# Patient Record
Sex: Female | Born: 1945
Health system: Southern US, Community
[De-identification: ages and names within clinical notes are randomized; demographics above are authoritative.]

## PROBLEM LIST (undated history)

## (undated) DIAGNOSIS — F32A Depression, unspecified: Secondary | ICD-10-CM

## (undated) DIAGNOSIS — K219 Gastro-esophageal reflux disease without esophagitis: Secondary | ICD-10-CM

## (undated) DIAGNOSIS — I442 Atrioventricular block, complete: Secondary | ICD-10-CM

## (undated) DIAGNOSIS — Z5189 Encounter for other specified aftercare: Secondary | ICD-10-CM

## (undated) DIAGNOSIS — M1990 Unspecified osteoarthritis, unspecified site: Secondary | ICD-10-CM

## (undated) DIAGNOSIS — I209 Angina pectoris, unspecified: Secondary | ICD-10-CM

## (undated) DIAGNOSIS — I4821 Permanent atrial fibrillation: Secondary | ICD-10-CM

## (undated) DIAGNOSIS — Z87891 Personal history of nicotine dependence: Secondary | ICD-10-CM

## (undated) DIAGNOSIS — K922 Gastrointestinal hemorrhage, unspecified: Secondary | ICD-10-CM

## (undated) DIAGNOSIS — R51 Headache: Secondary | ICD-10-CM

## (undated) DIAGNOSIS — M79601 Pain in right arm: Secondary | ICD-10-CM

## (undated) DIAGNOSIS — T4145XA Adverse effect of unspecified anesthetic, initial encounter: Secondary | ICD-10-CM

## (undated) DIAGNOSIS — F410 Panic disorder [episodic paroxysmal anxiety] without agoraphobia: Secondary | ICD-10-CM

## (undated) DIAGNOSIS — I422 Other hypertrophic cardiomyopathy: Secondary | ICD-10-CM

## (undated) DIAGNOSIS — Z950 Presence of cardiac pacemaker: Secondary | ICD-10-CM

## (undated) DIAGNOSIS — I639 Cerebral infarction, unspecified: Secondary | ICD-10-CM

## (undated) DIAGNOSIS — D649 Anemia, unspecified: Secondary | ICD-10-CM

## (undated) DIAGNOSIS — K5520 Angiodysplasia of colon without hemorrhage: Secondary | ICD-10-CM

## (undated) DIAGNOSIS — T783XXA Angioneurotic edema, initial encounter: Secondary | ICD-10-CM

## (undated) DIAGNOSIS — J449 Chronic obstructive pulmonary disease, unspecified: Secondary | ICD-10-CM

## (undated) DIAGNOSIS — E119 Type 2 diabetes mellitus without complications: Secondary | ICD-10-CM

## (undated) DIAGNOSIS — Z86010 Personal history of colonic polyps: Secondary | ICD-10-CM

## (undated) DIAGNOSIS — G2581 Restless legs syndrome: Secondary | ICD-10-CM

## (undated) DIAGNOSIS — F0390 Unspecified dementia without behavioral disturbance: Secondary | ICD-10-CM

## (undated) DIAGNOSIS — K760 Fatty (change of) liver, not elsewhere classified: Secondary | ICD-10-CM

## (undated) DIAGNOSIS — K31819 Angiodysplasia of stomach and duodenum without bleeding: Secondary | ICD-10-CM

## (undated) DIAGNOSIS — N19 Unspecified kidney failure: Secondary | ICD-10-CM

## (undated) DIAGNOSIS — IMO0001 Reserved for inherently not codable concepts without codable children: Secondary | ICD-10-CM

## (undated) DIAGNOSIS — I509 Heart failure, unspecified: Secondary | ICD-10-CM

## (undated) DIAGNOSIS — J189 Pneumonia, unspecified organism: Secondary | ICD-10-CM

## (undated) DIAGNOSIS — F329 Major depressive disorder, single episode, unspecified: Secondary | ICD-10-CM

## (undated) DIAGNOSIS — R0602 Shortness of breath: Secondary | ICD-10-CM

## (undated) DIAGNOSIS — I219 Acute myocardial infarction, unspecified: Secondary | ICD-10-CM

## (undated) DIAGNOSIS — Z9289 Personal history of other medical treatment: Secondary | ICD-10-CM

## (undated) DIAGNOSIS — T8859XA Other complications of anesthesia, initial encounter: Secondary | ICD-10-CM

## (undated) DIAGNOSIS — F1011 Alcohol abuse, in remission: Secondary | ICD-10-CM

## (undated) DIAGNOSIS — I10 Essential (primary) hypertension: Secondary | ICD-10-CM

## (undated) DIAGNOSIS — D509 Iron deficiency anemia, unspecified: Secondary | ICD-10-CM

## (undated) DIAGNOSIS — E785 Hyperlipidemia, unspecified: Secondary | ICD-10-CM

## (undated) DIAGNOSIS — Z9889 Other specified postprocedural states: Secondary | ICD-10-CM

## (undated) DIAGNOSIS — IMO0002 Reserved for concepts with insufficient information to code with codable children: Secondary | ICD-10-CM

## (undated) DIAGNOSIS — G4730 Sleep apnea, unspecified: Secondary | ICD-10-CM

## (undated) HISTORY — DX: Other specified postprocedural states: Z98.890

## (undated) HISTORY — DX: Personal history of other medical treatment: Z92.89

## (undated) HISTORY — DX: Personal history of colonic polyps: Z86.010

## (undated) HISTORY — DX: Type 2 diabetes mellitus without complications: E11.9

## (undated) HISTORY — DX: Permanent atrial fibrillation: I48.21

## (undated) HISTORY — DX: Essential (primary) hypertension: I10

## (undated) HISTORY — DX: Unspecified osteoarthritis, unspecified site: M19.90

## (undated) HISTORY — DX: Sleep apnea, unspecified: G47.30

## (undated) HISTORY — DX: Panic disorder (episodic paroxysmal anxiety): F41.0

## (undated) HISTORY — DX: Depression, unspecified: F32.A

## (undated) HISTORY — DX: Hyperlipidemia, unspecified: E78.5

## (undated) HISTORY — DX: Iron deficiency anemia, unspecified: D50.9

## (undated) HISTORY — DX: Angiodysplasia of colon without hemorrhage: K55.20

## (undated) HISTORY — DX: Pain in right arm: M79.601

## (undated) HISTORY — DX: Anemia, unspecified: D64.9

## (undated) HISTORY — DX: Reserved for concepts with insufficient information to code with codable children: IMO0002

## (undated) HISTORY — DX: Restless legs syndrome: G25.81

## (undated) HISTORY — DX: Fatty (change of) liver, not elsewhere classified: K76.0

## (undated) HISTORY — DX: Unspecified kidney failure: N19

## (undated) HISTORY — DX: Alcohol abuse, in remission: F10.11

## (undated) HISTORY — PX: CARDIAC CATHETERIZATION: SHX172

## (undated) HISTORY — DX: Atrioventricular block, complete: I44.2

## (undated) HISTORY — DX: Other hypertrophic cardiomyopathy: I42.2

## (undated) HISTORY — DX: Angiodysplasia of stomach and duodenum without bleeding: K31.819

## (undated) HISTORY — DX: Personal history of nicotine dependence: Z87.891

## (undated) HISTORY — DX: Angioneurotic edema, initial encounter: T78.3XXA

## (undated) HISTORY — DX: Major depressive disorder, single episode, unspecified: F32.9

## (undated) HISTORY — DX: Gastrointestinal hemorrhage, unspecified: K92.2

## (undated) SURGERY — EGD (ESOPHAGOGASTRODUODENOSCOPY)
Anesthesia: Monitor Anesthesia Care

---

## 1996-12-15 HISTORY — PX: PACEMAKER INSERTION: SHX728

## 1998-04-13 ENCOUNTER — Encounter: Admission: RE | Admit: 1998-04-13 | Discharge: 1998-04-13 | Payer: Self-pay | Admitting: Sports Medicine

## 1998-07-16 ENCOUNTER — Encounter: Admission: RE | Admit: 1998-07-16 | Discharge: 1998-07-16 | Payer: Self-pay | Admitting: Family Medicine

## 1998-10-04 ENCOUNTER — Encounter: Admission: RE | Admit: 1998-10-04 | Discharge: 1998-10-04 | Payer: Self-pay | Admitting: Family Medicine

## 1998-11-29 ENCOUNTER — Encounter: Admission: RE | Admit: 1998-11-29 | Discharge: 1998-11-29 | Payer: Self-pay | Admitting: Family Medicine

## 1999-02-04 ENCOUNTER — Encounter: Admission: RE | Admit: 1999-02-04 | Discharge: 1999-02-04 | Payer: Self-pay | Admitting: Family Medicine

## 1999-02-23 ENCOUNTER — Encounter: Payer: Self-pay | Admitting: Emergency Medicine

## 1999-02-23 ENCOUNTER — Emergency Department (HOSPITAL_COMMUNITY): Admission: EM | Admit: 1999-02-23 | Discharge: 1999-02-23 | Payer: Self-pay | Admitting: Emergency Medicine

## 1999-02-26 ENCOUNTER — Other Ambulatory Visit: Admission: RE | Admit: 1999-02-26 | Discharge: 1999-02-26 | Payer: Self-pay | Admitting: *Deleted

## 1999-02-26 ENCOUNTER — Encounter: Admission: RE | Admit: 1999-02-26 | Discharge: 1999-02-26 | Payer: Self-pay | Admitting: Sports Medicine

## 1999-03-18 ENCOUNTER — Encounter: Admission: RE | Admit: 1999-03-18 | Discharge: 1999-03-18 | Payer: Self-pay | Admitting: Family Medicine

## 1999-04-09 ENCOUNTER — Encounter: Admission: RE | Admit: 1999-04-09 | Discharge: 1999-04-09 | Payer: Self-pay | Admitting: Family Medicine

## 1999-05-27 ENCOUNTER — Other Ambulatory Visit: Admission: RE | Admit: 1999-05-27 | Discharge: 1999-05-27 | Payer: Self-pay | Admitting: Obstetrics

## 1999-05-27 ENCOUNTER — Encounter: Admission: RE | Admit: 1999-05-27 | Discharge: 1999-05-27 | Payer: Self-pay | Admitting: Family Medicine

## 1999-06-10 ENCOUNTER — Encounter: Admission: RE | Admit: 1999-06-10 | Discharge: 1999-06-10 | Payer: Self-pay | Admitting: Family Medicine

## 1999-07-06 ENCOUNTER — Ambulatory Visit: Admission: RE | Admit: 1999-07-06 | Discharge: 1999-07-06 | Payer: Self-pay | Admitting: Otolaryngology

## 1999-10-10 ENCOUNTER — Encounter: Admission: RE | Admit: 1999-10-10 | Discharge: 1999-10-10 | Payer: Self-pay | Admitting: Family Medicine

## 1999-10-30 ENCOUNTER — Encounter: Admission: RE | Admit: 1999-10-30 | Discharge: 1999-10-30 | Payer: Self-pay | Admitting: Family Medicine

## 2000-01-14 ENCOUNTER — Encounter: Admission: RE | Admit: 2000-01-14 | Discharge: 2000-01-14 | Payer: Self-pay | Admitting: Sports Medicine

## 2000-04-01 ENCOUNTER — Encounter: Admission: RE | Admit: 2000-04-01 | Discharge: 2000-04-01 | Payer: Self-pay | Admitting: Family Medicine

## 2000-04-01 HISTORY — PX: TUBAL LIGATION: SHX77

## 2000-04-27 ENCOUNTER — Encounter: Admission: RE | Admit: 2000-04-27 | Discharge: 2000-04-27 | Payer: Self-pay | Admitting: Sports Medicine

## 2000-04-27 ENCOUNTER — Encounter: Payer: Self-pay | Admitting: Sports Medicine

## 2000-04-30 ENCOUNTER — Encounter: Admission: RE | Admit: 2000-04-30 | Discharge: 2000-04-30 | Payer: Self-pay | Admitting: Family Medicine

## 2000-11-03 ENCOUNTER — Other Ambulatory Visit: Admission: RE | Admit: 2000-11-03 | Discharge: 2000-11-03 | Payer: Self-pay | Admitting: *Deleted

## 2000-11-03 ENCOUNTER — Encounter: Admission: RE | Admit: 2000-11-03 | Discharge: 2000-11-03 | Payer: Self-pay | Admitting: Sports Medicine

## 2001-03-18 ENCOUNTER — Encounter: Admission: RE | Admit: 2001-03-18 | Discharge: 2001-03-18 | Payer: Self-pay | Admitting: Family Medicine

## 2001-07-13 ENCOUNTER — Encounter: Admission: RE | Admit: 2001-07-13 | Discharge: 2001-07-13 | Payer: Self-pay | Admitting: Family Medicine

## 2001-12-20 ENCOUNTER — Encounter: Admission: RE | Admit: 2001-12-20 | Discharge: 2001-12-20 | Payer: Self-pay | Admitting: Family Medicine

## 2001-12-27 ENCOUNTER — Encounter: Admission: RE | Admit: 2001-12-27 | Discharge: 2001-12-27 | Payer: Self-pay | Admitting: *Deleted

## 2002-01-04 ENCOUNTER — Other Ambulatory Visit: Admission: RE | Admit: 2002-01-04 | Discharge: 2002-01-04 | Payer: Self-pay | Admitting: Family Medicine

## 2002-01-04 ENCOUNTER — Ambulatory Visit (HOSPITAL_COMMUNITY): Admission: RE | Admit: 2002-01-04 | Discharge: 2002-01-04 | Payer: Self-pay | Admitting: Family Medicine

## 2002-01-04 ENCOUNTER — Encounter: Admission: RE | Admit: 2002-01-04 | Discharge: 2002-01-04 | Payer: Self-pay | Admitting: Family Medicine

## 2002-01-11 ENCOUNTER — Encounter: Admission: RE | Admit: 2002-01-11 | Discharge: 2002-01-11 | Payer: Self-pay | Admitting: Family Medicine

## 2002-05-31 ENCOUNTER — Encounter: Admission: RE | Admit: 2002-05-31 | Discharge: 2002-05-31 | Payer: Self-pay | Admitting: Family Medicine

## 2002-08-01 ENCOUNTER — Encounter: Admission: RE | Admit: 2002-08-01 | Discharge: 2002-08-01 | Payer: Self-pay | Admitting: Sports Medicine

## 2002-08-01 ENCOUNTER — Encounter: Admission: RE | Admit: 2002-08-01 | Discharge: 2002-08-01 | Payer: Self-pay | Admitting: Family Medicine

## 2002-08-01 ENCOUNTER — Encounter: Payer: Self-pay | Admitting: Sports Medicine

## 2002-08-30 ENCOUNTER — Encounter: Admission: RE | Admit: 2002-08-30 | Discharge: 2002-08-30 | Payer: Self-pay | Admitting: Family Medicine

## 2002-09-28 ENCOUNTER — Encounter: Admission: RE | Admit: 2002-09-28 | Discharge: 2002-09-28 | Payer: Self-pay | Admitting: Family Medicine

## 2003-01-13 ENCOUNTER — Encounter: Admission: RE | Admit: 2003-01-13 | Discharge: 2003-01-13 | Payer: Self-pay | Admitting: Family Medicine

## 2003-02-06 ENCOUNTER — Encounter: Admission: RE | Admit: 2003-02-06 | Discharge: 2003-02-06 | Payer: Self-pay | Admitting: Family Medicine

## 2003-02-13 ENCOUNTER — Encounter: Payer: Self-pay | Admitting: Sports Medicine

## 2003-02-13 ENCOUNTER — Encounter: Admission: RE | Admit: 2003-02-13 | Discharge: 2003-02-13 | Payer: Self-pay | Admitting: Sports Medicine

## 2003-07-14 ENCOUNTER — Encounter: Admission: RE | Admit: 2003-07-14 | Discharge: 2003-07-14 | Payer: Self-pay | Admitting: Family Medicine

## 2003-10-12 ENCOUNTER — Encounter: Admission: RE | Admit: 2003-10-12 | Discharge: 2003-10-12 | Payer: Self-pay | Admitting: Family Medicine

## 2003-11-13 ENCOUNTER — Encounter: Admission: RE | Admit: 2003-11-13 | Discharge: 2003-11-13 | Payer: Self-pay | Admitting: Sports Medicine

## 2003-11-13 ENCOUNTER — Other Ambulatory Visit: Admission: RE | Admit: 2003-11-13 | Discharge: 2003-11-13 | Payer: Self-pay | Admitting: Family Medicine

## 2003-11-17 ENCOUNTER — Ambulatory Visit (HOSPITAL_COMMUNITY): Admission: RE | Admit: 2003-11-17 | Discharge: 2003-11-18 | Payer: Self-pay | Admitting: Cardiology

## 2003-11-18 ENCOUNTER — Ambulatory Visit (HOSPITAL_COMMUNITY): Admission: RE | Admit: 2003-11-18 | Discharge: 2003-11-18 | Payer: Self-pay | Admitting: Cardiology

## 2004-04-03 ENCOUNTER — Encounter: Payer: Self-pay | Admitting: Internal Medicine

## 2004-04-10 ENCOUNTER — Encounter: Admission: RE | Admit: 2004-04-10 | Discharge: 2004-04-10 | Payer: Self-pay | Admitting: Family Medicine

## 2004-05-01 ENCOUNTER — Encounter: Payer: Self-pay | Admitting: Internal Medicine

## 2004-05-07 ENCOUNTER — Encounter: Admission: RE | Admit: 2004-05-07 | Discharge: 2004-05-07 | Payer: Self-pay | Admitting: Sports Medicine

## 2004-09-04 ENCOUNTER — Ambulatory Visit: Payer: Self-pay | Admitting: Family Medicine

## 2004-11-25 ENCOUNTER — Ambulatory Visit: Payer: Self-pay | Admitting: Cardiology

## 2004-12-03 ENCOUNTER — Ambulatory Visit: Payer: Self-pay | Admitting: Internal Medicine

## 2005-01-06 ENCOUNTER — Ambulatory Visit: Payer: Self-pay | Admitting: Internal Medicine

## 2005-02-10 ENCOUNTER — Ambulatory Visit: Payer: Self-pay | Admitting: Cardiology

## 2005-02-28 ENCOUNTER — Ambulatory Visit: Payer: Self-pay | Admitting: Cardiology

## 2005-05-29 ENCOUNTER — Ambulatory Visit: Payer: Self-pay | Admitting: Cardiology

## 2005-06-18 ENCOUNTER — Ambulatory Visit: Payer: Self-pay | Admitting: Family Medicine

## 2005-07-02 ENCOUNTER — Encounter: Admission: RE | Admit: 2005-07-02 | Discharge: 2005-07-02 | Payer: Self-pay | Admitting: Sports Medicine

## 2005-07-09 ENCOUNTER — Ambulatory Visit: Payer: Self-pay | Admitting: Sports Medicine

## 2005-08-11 ENCOUNTER — Ambulatory Visit: Payer: Self-pay | Admitting: Family Medicine

## 2005-08-28 ENCOUNTER — Ambulatory Visit: Payer: Self-pay | Admitting: Cardiology

## 2005-09-10 ENCOUNTER — Ambulatory Visit: Payer: Self-pay | Admitting: Family Medicine

## 2005-09-10 ENCOUNTER — Other Ambulatory Visit: Admission: RE | Admit: 2005-09-10 | Discharge: 2005-09-10 | Payer: Self-pay | Admitting: Family Medicine

## 2005-11-27 ENCOUNTER — Ambulatory Visit: Payer: Self-pay | Admitting: Cardiology

## 2006-03-02 ENCOUNTER — Ambulatory Visit: Payer: Self-pay | Admitting: Cardiology

## 2006-03-31 ENCOUNTER — Ambulatory Visit: Payer: Self-pay | Admitting: Family Medicine

## 2006-04-13 ENCOUNTER — Ambulatory Visit: Payer: Self-pay | Admitting: Cardiology

## 2006-04-17 ENCOUNTER — Ambulatory Visit: Payer: Self-pay | Admitting: Family Medicine

## 2006-04-28 ENCOUNTER — Ambulatory Visit: Payer: Self-pay | Admitting: Sports Medicine

## 2006-05-04 ENCOUNTER — Ambulatory Visit: Payer: Self-pay | Admitting: Cardiology

## 2006-05-04 ENCOUNTER — Encounter: Payer: Self-pay | Admitting: Cardiology

## 2006-05-04 ENCOUNTER — Ambulatory Visit: Payer: Self-pay

## 2006-06-01 ENCOUNTER — Ambulatory Visit: Payer: Self-pay | Admitting: Cardiology

## 2006-06-10 ENCOUNTER — Ambulatory Visit: Payer: Self-pay | Admitting: Family Medicine

## 2006-06-16 ENCOUNTER — Ambulatory Visit (HOSPITAL_COMMUNITY): Admission: RE | Admit: 2006-06-16 | Discharge: 2006-06-16 | Payer: Self-pay | Admitting: Sports Medicine

## 2006-09-14 ENCOUNTER — Encounter (INDEPENDENT_AMBULATORY_CARE_PROVIDER_SITE_OTHER): Payer: Self-pay | Admitting: *Deleted

## 2006-09-14 LAB — CONVERTED CEMR LAB

## 2006-10-02 ENCOUNTER — Other Ambulatory Visit: Admission: RE | Admit: 2006-10-02 | Discharge: 2006-10-02 | Payer: Self-pay | Admitting: Family Medicine

## 2006-10-02 ENCOUNTER — Ambulatory Visit: Payer: Self-pay | Admitting: Family Medicine

## 2006-10-14 ENCOUNTER — Encounter: Admission: RE | Admit: 2006-10-14 | Discharge: 2006-10-14 | Payer: Self-pay | Admitting: Family Medicine

## 2006-11-09 ENCOUNTER — Ambulatory Visit: Payer: Self-pay | Admitting: Family Medicine

## 2006-11-17 ENCOUNTER — Ambulatory Visit: Payer: Self-pay | Admitting: Family Medicine

## 2006-11-30 ENCOUNTER — Ambulatory Visit: Payer: Self-pay | Admitting: Cardiology

## 2006-12-21 ENCOUNTER — Ambulatory Visit: Payer: Self-pay

## 2007-01-28 ENCOUNTER — Ambulatory Visit: Payer: Self-pay | Admitting: Cardiology

## 2007-02-11 DIAGNOSIS — E785 Hyperlipidemia, unspecified: Secondary | ICD-10-CM

## 2007-02-11 DIAGNOSIS — I10 Essential (primary) hypertension: Secondary | ICD-10-CM | POA: Insufficient documentation

## 2007-02-11 DIAGNOSIS — G4730 Sleep apnea, unspecified: Secondary | ICD-10-CM | POA: Insufficient documentation

## 2007-02-12 ENCOUNTER — Encounter (INDEPENDENT_AMBULATORY_CARE_PROVIDER_SITE_OTHER): Payer: Self-pay | Admitting: *Deleted

## 2007-02-15 ENCOUNTER — Ambulatory Visit: Payer: Self-pay | Admitting: Family Medicine

## 2007-03-12 ENCOUNTER — Ambulatory Visit: Payer: Self-pay | Admitting: Family Medicine

## 2007-03-12 ENCOUNTER — Encounter (INDEPENDENT_AMBULATORY_CARE_PROVIDER_SITE_OTHER): Payer: Self-pay | Admitting: Family Medicine

## 2007-03-12 LAB — CONVERTED CEMR LAB
Nitrite: NEGATIVE
Specific Gravity, Urine: >=1.03
Whiff Test: NEGATIVE
pH: 5.5

## 2007-03-16 ENCOUNTER — Encounter (INDEPENDENT_AMBULATORY_CARE_PROVIDER_SITE_OTHER): Payer: Self-pay | Admitting: Family Medicine

## 2007-04-06 ENCOUNTER — Emergency Department (HOSPITAL_COMMUNITY): Admission: EM | Admit: 2007-04-06 | Discharge: 2007-04-07 | Payer: Self-pay | Admitting: Emergency Medicine

## 2007-06-29 ENCOUNTER — Ambulatory Visit: Payer: Self-pay | Admitting: Family Medicine

## 2007-06-29 ENCOUNTER — Encounter: Payer: Self-pay | Admitting: Family Medicine

## 2007-06-29 LAB — CONVERTED CEMR LAB
HCT: 32.8 % — ABNORMAL LOW (ref 36.0–46.0)
Hemoglobin: 10.8 g/dL — ABNORMAL LOW (ref 12.0–15.0)
MCV: 92.9 fL (ref 78.0–100.0)
RBC: 3.53 M/uL — ABNORMAL LOW (ref 3.87–5.11)
WBC: 6 10*3/uL (ref 4.0–10.5)

## 2007-07-12 ENCOUNTER — Ambulatory Visit: Payer: Self-pay | Admitting: Cardiology

## 2007-07-19 ENCOUNTER — Ambulatory Visit: Payer: Self-pay | Admitting: Sports Medicine

## 2007-07-19 ENCOUNTER — Encounter (INDEPENDENT_AMBULATORY_CARE_PROVIDER_SITE_OTHER): Payer: Self-pay | Admitting: Family Medicine

## 2007-07-19 LAB — CONVERTED CEMR LAB
Chlamydia, DNA Probe: NEGATIVE
GC Probe Amp, Genital: NEGATIVE

## 2007-07-20 ENCOUNTER — Encounter (INDEPENDENT_AMBULATORY_CARE_PROVIDER_SITE_OTHER): Payer: Self-pay | Admitting: Family Medicine

## 2007-10-13 ENCOUNTER — Ambulatory Visit: Payer: Self-pay | Admitting: Cardiology

## 2007-10-15 ENCOUNTER — Encounter: Payer: Self-pay | Admitting: Family Medicine

## 2007-10-15 ENCOUNTER — Ambulatory Visit: Payer: Self-pay | Admitting: Family Medicine

## 2007-10-15 ENCOUNTER — Other Ambulatory Visit: Admission: RE | Admit: 2007-10-15 | Discharge: 2007-10-15 | Payer: Self-pay | Admitting: Family Medicine

## 2007-10-19 ENCOUNTER — Encounter: Payer: Self-pay | Admitting: Family Medicine

## 2007-10-19 LAB — CONVERTED CEMR LAB: Chlamydia, DNA Probe: NEGATIVE

## 2007-10-21 ENCOUNTER — Encounter: Payer: Self-pay | Admitting: Family Medicine

## 2007-11-02 ENCOUNTER — Encounter: Payer: Self-pay | Admitting: Family Medicine

## 2007-12-27 ENCOUNTER — Ambulatory Visit: Payer: Self-pay | Admitting: Cardiology

## 2007-12-27 LAB — CONVERTED CEMR LAB
GFR calc Af Amer: 72 mL/min
GFR calc non Af Amer: 60 mL/min
Glucose, Bld: 108 mg/dL — ABNORMAL HIGH (ref 70–99)
Sodium: 138 meq/L (ref 135–145)

## 2008-01-03 ENCOUNTER — Encounter: Payer: Self-pay | Admitting: Family Medicine

## 2008-01-11 ENCOUNTER — Ambulatory Visit: Payer: Self-pay | Admitting: Cardiology

## 2008-01-11 LAB — CONVERTED CEMR LAB
Chloride: 100 meq/L (ref 96–112)
GFR calc Af Amer: 72 mL/min
GFR calc non Af Amer: 60 mL/min
Glucose, Bld: 131 mg/dL — ABNORMAL HIGH (ref 70–99)
Sodium: 137 meq/L (ref 135–145)

## 2008-01-14 ENCOUNTER — Ambulatory Visit: Payer: Self-pay | Admitting: Family Medicine

## 2008-01-14 ENCOUNTER — Telehealth: Payer: Self-pay | Admitting: *Deleted

## 2008-01-18 ENCOUNTER — Ambulatory Visit: Payer: Self-pay | Admitting: Family Medicine

## 2008-01-18 ENCOUNTER — Encounter: Admission: RE | Admit: 2008-01-18 | Discharge: 2008-01-18 | Payer: Self-pay | Admitting: Family Medicine

## 2008-01-20 ENCOUNTER — Encounter: Payer: Self-pay | Admitting: Family Medicine

## 2008-02-22 ENCOUNTER — Emergency Department (HOSPITAL_COMMUNITY): Admission: EM | Admit: 2008-02-22 | Discharge: 2008-02-23 | Payer: Self-pay | Admitting: Emergency Medicine

## 2008-03-15 ENCOUNTER — Encounter: Payer: Self-pay | Admitting: Family Medicine

## 2008-03-15 ENCOUNTER — Ambulatory Visit: Payer: Self-pay | Admitting: Internal Medicine

## 2008-03-15 ENCOUNTER — Observation Stay (HOSPITAL_COMMUNITY): Admission: EM | Admit: 2008-03-15 | Discharge: 2008-03-15 | Payer: Self-pay | Admitting: Emergency Medicine

## 2008-03-27 ENCOUNTER — Encounter: Admission: RE | Admit: 2008-03-27 | Discharge: 2008-03-27 | Payer: Self-pay | Admitting: Family Medicine

## 2008-04-06 ENCOUNTER — Encounter: Payer: Self-pay | Admitting: *Deleted

## 2008-05-11 ENCOUNTER — Inpatient Hospital Stay (HOSPITAL_COMMUNITY): Admission: EM | Admit: 2008-05-11 | Discharge: 2008-05-16 | Payer: Self-pay | Admitting: Emergency Medicine

## 2008-05-11 ENCOUNTER — Ambulatory Visit: Payer: Self-pay | Admitting: Cardiology

## 2008-05-11 ENCOUNTER — Ambulatory Visit: Payer: Self-pay | Admitting: Family Medicine

## 2008-05-11 ENCOUNTER — Encounter: Payer: Self-pay | Admitting: Family Medicine

## 2008-05-11 ENCOUNTER — Ambulatory Visit: Payer: Self-pay | Admitting: Pulmonary Disease

## 2008-05-19 ENCOUNTER — Ambulatory Visit: Payer: Self-pay | Admitting: Cardiology

## 2008-05-19 LAB — CONVERTED CEMR LAB
BUN: 38 mg/dL — ABNORMAL HIGH (ref 6–23)
CO2: 28 meq/L (ref 19–32)
GFR calc Af Amer: 53 mL/min
Glucose, Bld: 94 mg/dL (ref 70–99)
Potassium: 4.1 meq/L (ref 3.5–5.1)

## 2008-05-22 ENCOUNTER — Ambulatory Visit: Payer: Self-pay | Admitting: Cardiology

## 2008-05-24 ENCOUNTER — Encounter: Payer: Self-pay | Admitting: Family Medicine

## 2008-06-01 ENCOUNTER — Ambulatory Visit: Payer: Self-pay | Admitting: Family Medicine

## 2008-06-01 ENCOUNTER — Encounter: Payer: Self-pay | Admitting: *Deleted

## 2008-06-06 ENCOUNTER — Ambulatory Visit: Payer: Self-pay | Admitting: Internal Medicine

## 2008-06-14 ENCOUNTER — Ambulatory Visit: Payer: Self-pay | Admitting: Cardiology

## 2008-06-14 LAB — CONVERTED CEMR LAB
Basophils Absolute: 0 10*3/uL (ref 0.0–0.1)
CO2: 26 meq/L (ref 19–32)
Calcium: 9.4 mg/dL (ref 8.4–10.5)
Creatinine, Ser: 1.3 mg/dL — ABNORMAL HIGH (ref 0.4–1.2)
Eosinophils Absolute: 0.5 10*3/uL (ref 0.0–0.7)
GFR calc Af Amer: 53 mL/min
GFR calc non Af Amer: 44 mL/min
Hemoglobin: 11 g/dL — ABNORMAL LOW (ref 12.0–15.0)
Lymphocytes Relative: 46.1 % — ABNORMAL HIGH (ref 12.0–46.0)
MCHC: 33.5 g/dL (ref 30.0–36.0)
MCV: 88.3 fL (ref 78.0–100.0)
Monocytes Absolute: 0.9 10*3/uL (ref 0.1–1.0)
Neutro Abs: 0.7 10*3/uL — ABNORMAL LOW (ref 1.4–7.7)
RDW: 15.1 % — ABNORMAL HIGH (ref 11.5–14.6)
Sodium: 139 meq/L (ref 135–145)

## 2008-06-23 ENCOUNTER — Ambulatory Visit: Payer: Self-pay

## 2008-06-23 ENCOUNTER — Ambulatory Visit: Payer: Self-pay | Admitting: Cardiology

## 2008-06-23 ENCOUNTER — Encounter: Payer: Self-pay | Admitting: Cardiology

## 2008-08-14 ENCOUNTER — Ambulatory Visit: Payer: Self-pay | Admitting: Cardiology

## 2008-08-14 LAB — CONVERTED CEMR LAB
BUN: 35 mg/dL — ABNORMAL HIGH (ref 6–23)
Basophils Relative: 0.9 % (ref 0.0–3.0)
CO2: 28 meq/L (ref 19–32)
Chloride: 109 meq/L (ref 96–112)
Creatinine, Ser: 1.2 mg/dL (ref 0.4–1.2)
Eosinophils Relative: 8.4 % — ABNORMAL HIGH (ref 0.0–5.0)
GFR calc non Af Amer: 48 mL/min
Lymphocytes Relative: 41.1 % (ref 12.0–46.0)
Monocytes Relative: 9.4 % (ref 3.0–12.0)
Neutrophils Relative %: 40.2 % — ABNORMAL LOW (ref 43.0–77.0)
Platelets: 219 10*3/uL (ref 150–400)
Potassium: 4.6 meq/L (ref 3.5–5.1)
RBC: 3.6 M/uL — ABNORMAL LOW (ref 3.87–5.11)
WBC: 4.2 10*3/uL — ABNORMAL LOW (ref 4.5–10.5)

## 2008-08-28 ENCOUNTER — Ambulatory Visit: Payer: Self-pay

## 2008-08-28 ENCOUNTER — Ambulatory Visit: Payer: Self-pay | Admitting: Cardiology

## 2008-12-05 ENCOUNTER — Encounter: Payer: Self-pay | Admitting: *Deleted

## 2008-12-12 ENCOUNTER — Encounter: Payer: Self-pay | Admitting: Family Medicine

## 2008-12-12 ENCOUNTER — Ambulatory Visit: Payer: Self-pay | Admitting: Family Medicine

## 2008-12-12 LAB — CONVERTED CEMR LAB: Direct LDL: 85 mg/dL

## 2008-12-20 ENCOUNTER — Ambulatory Visit: Payer: Self-pay | Admitting: Cardiology

## 2008-12-20 LAB — CONVERTED CEMR LAB
Basophils Relative: 0.8 % (ref 0.0–3.0)
CO2: 31 meq/L (ref 19–32)
Calcium: 10.1 mg/dL (ref 8.4–10.5)
Creatinine, Ser: 1.2 mg/dL (ref 0.4–1.2)
GFR calc Af Amer: 58 mL/min
Glucose, Bld: 109 mg/dL — ABNORMAL HIGH (ref 70–99)
HCT: 34.1 % — ABNORMAL LOW (ref 36.0–46.0)
Hemoglobin: 11.7 g/dL — ABNORMAL LOW (ref 12.0–15.0)
Lymphocytes Relative: 29.7 % (ref 12.0–46.0)
MCHC: 34.3 g/dL (ref 30.0–36.0)
Monocytes Absolute: 0.8 10*3/uL (ref 0.1–1.0)
Monocytes Relative: 13.2 % — ABNORMAL HIGH (ref 3.0–12.0)
Neutro Abs: 3 10*3/uL (ref 1.4–7.7)
RBC: 3.71 M/uL — ABNORMAL LOW (ref 3.87–5.11)
RDW: 12.8 % (ref 11.5–14.6)
Sodium: 139 meq/L (ref 135–145)

## 2008-12-21 ENCOUNTER — Encounter: Payer: Self-pay | Admitting: Family Medicine

## 2009-01-18 ENCOUNTER — Ambulatory Visit: Payer: Self-pay | Admitting: Family Medicine

## 2009-01-25 ENCOUNTER — Ambulatory Visit: Payer: Self-pay | Admitting: Family Medicine

## 2009-01-30 ENCOUNTER — Encounter: Payer: Self-pay | Admitting: Family Medicine

## 2009-03-26 ENCOUNTER — Encounter: Payer: Self-pay | Admitting: Family Medicine

## 2009-03-26 ENCOUNTER — Ambulatory Visit: Payer: Self-pay | Admitting: Family Medicine

## 2009-04-03 ENCOUNTER — Encounter: Admission: RE | Admit: 2009-04-03 | Discharge: 2009-04-03 | Payer: Self-pay | Admitting: Family Medicine

## 2009-04-07 ENCOUNTER — Encounter (INDEPENDENT_AMBULATORY_CARE_PROVIDER_SITE_OTHER): Payer: Self-pay | Admitting: *Deleted

## 2009-04-17 ENCOUNTER — Ambulatory Visit: Payer: Self-pay | Admitting: Cardiology

## 2009-04-17 LAB — CONVERTED CEMR LAB
BUN: 30 mg/dL — ABNORMAL HIGH (ref 6–23)
Basophils Absolute: 0 10*3/uL (ref 0.0–0.1)
CO2: 28 meq/L (ref 19–32)
Calcium: 9.5 mg/dL (ref 8.4–10.5)
Creatinine, Ser: 1.6 mg/dL — ABNORMAL HIGH (ref 0.4–1.2)
Eosinophils Absolute: 0.2 10*3/uL (ref 0.0–0.7)
GFR calc non Af Amer: 41.57 mL/min (ref >60)
Glucose, Bld: 90 mg/dL (ref 70–99)
Lymphocytes Relative: 39.1 % (ref 12.0–46.0)
MCHC: 34 g/dL (ref 30.0–36.0)
Monocytes Relative: 12.1 % — ABNORMAL HIGH (ref 3.0–12.0)
Neutro Abs: 1.6 10*3/uL (ref 1.4–7.7)
Neutrophils Relative %: 43 % (ref 43.0–77.0)
Platelets: 177 10*3/uL (ref 150.0–400.0)
RDW: 12.8 % (ref 11.5–14.6)

## 2009-04-25 ENCOUNTER — Ambulatory Visit: Payer: Self-pay | Admitting: Family Medicine

## 2009-04-25 ENCOUNTER — Encounter: Payer: Self-pay | Admitting: Family Medicine

## 2009-04-25 LAB — CONVERTED CEMR LAB
Chlamydia, DNA Probe: NEGATIVE
GC Probe Amp, Genital: NEGATIVE
Hgb A1c MFr Bld: 6.6 %

## 2009-04-27 ENCOUNTER — Encounter: Payer: Self-pay | Admitting: Family Medicine

## 2009-06-21 ENCOUNTER — Telehealth: Payer: Self-pay | Admitting: *Deleted

## 2009-07-05 ENCOUNTER — Encounter: Payer: Self-pay | Admitting: Family Medicine

## 2009-07-05 ENCOUNTER — Ambulatory Visit: Payer: Self-pay | Admitting: Family Medicine

## 2009-07-05 LAB — CONVERTED CEMR LAB
Albumin: 4 g/dL (ref 3.5–5.2)
Alkaline Phosphatase: 46 units/L (ref 39–117)
BUN: 29 mg/dL — ABNORMAL HIGH (ref 6–23)
Calcium: 9.5 mg/dL (ref 8.4–10.5)
Creatinine, Ser: 1.29 mg/dL — ABNORMAL HIGH (ref 0.40–1.20)
Glucose, Bld: 97 mg/dL (ref 70–99)
Potassium: 4.6 meq/L (ref 3.5–5.3)

## 2009-07-06 ENCOUNTER — Encounter: Payer: Self-pay | Admitting: Family Medicine

## 2009-08-22 ENCOUNTER — Telehealth: Payer: Self-pay | Admitting: Cardiology

## 2009-08-22 ENCOUNTER — Encounter: Payer: Self-pay | Admitting: Cardiology

## 2009-08-22 ENCOUNTER — Ambulatory Visit: Payer: Self-pay

## 2009-08-28 ENCOUNTER — Telehealth: Payer: Self-pay | Admitting: Cardiology

## 2009-08-31 ENCOUNTER — Encounter (INDEPENDENT_AMBULATORY_CARE_PROVIDER_SITE_OTHER): Payer: Self-pay | Admitting: *Deleted

## 2009-09-11 ENCOUNTER — Ambulatory Visit: Payer: Self-pay | Admitting: Cardiology

## 2009-09-11 ENCOUNTER — Encounter (INDEPENDENT_AMBULATORY_CARE_PROVIDER_SITE_OTHER): Payer: Self-pay | Admitting: *Deleted

## 2009-10-01 ENCOUNTER — Ambulatory Visit: Payer: Self-pay | Admitting: Cardiology

## 2009-10-01 ENCOUNTER — Encounter: Payer: Self-pay | Admitting: Cardiology

## 2009-10-01 ENCOUNTER — Ambulatory Visit: Payer: Self-pay

## 2009-10-01 ENCOUNTER — Ambulatory Visit: Payer: Self-pay | Admitting: Internal Medicine

## 2009-10-01 ENCOUNTER — Ambulatory Visit (HOSPITAL_COMMUNITY): Admission: RE | Admit: 2009-10-01 | Discharge: 2009-10-01 | Payer: Self-pay | Admitting: Internal Medicine

## 2009-10-09 LAB — CONVERTED CEMR LAB
BUN: 25 mg/dL — ABNORMAL HIGH (ref 6–23)
Basophils Relative: 1.5 % (ref 0.0–3.0)
Chloride: 103 meq/L (ref 96–112)
Eosinophils Relative: 5 % (ref 0.0–5.0)
Glucose, Bld: 95 mg/dL (ref 70–99)
Hemoglobin: 11.9 g/dL — ABNORMAL LOW (ref 12.0–15.0)
Lymphocytes Relative: 35.5 % (ref 12.0–46.0)
MCV: 93.6 fL (ref 78.0–100.0)
Neutrophils Relative %: 48.6 % (ref 43.0–77.0)
Potassium: 4.8 meq/L (ref 3.5–5.1)
RBC: 3.78 M/uL — ABNORMAL LOW (ref 3.87–5.11)
WBC: 3.6 10*3/uL — ABNORMAL LOW (ref 4.5–10.5)

## 2010-01-11 ENCOUNTER — Ambulatory Visit: Payer: Self-pay | Admitting: Family Medicine

## 2010-01-11 ENCOUNTER — Encounter: Payer: Self-pay | Admitting: Family Medicine

## 2010-01-11 LAB — CONVERTED CEMR LAB
BUN: 32 mg/dL — ABNORMAL HIGH (ref 6–23)
CO2: 25 meq/L (ref 19–32)
Calcium: 9.8 mg/dL (ref 8.4–10.5)
Chloride: 104 meq/L (ref 96–112)
Creatinine, Ser: 1.31 mg/dL — ABNORMAL HIGH (ref 0.40–1.20)
Direct LDL: 79 mg/dL
Glucose, Bld: 108 mg/dL — ABNORMAL HIGH (ref 70–99)

## 2010-01-17 ENCOUNTER — Ambulatory Visit: Payer: Self-pay | Admitting: Cardiology

## 2010-02-08 ENCOUNTER — Ambulatory Visit: Payer: Self-pay | Admitting: Family Medicine

## 2010-04-23 ENCOUNTER — Encounter: Admission: RE | Admit: 2010-04-23 | Discharge: 2010-04-23 | Payer: Self-pay | Admitting: Cardiology

## 2010-05-02 ENCOUNTER — Telehealth: Payer: Self-pay | Admitting: *Deleted

## 2010-05-07 ENCOUNTER — Encounter: Payer: Self-pay | Admitting: Family Medicine

## 2010-05-07 ENCOUNTER — Ambulatory Visit: Payer: Self-pay | Admitting: Family Medicine

## 2010-05-07 LAB — CONVERTED CEMR LAB: Hgb A1c MFr Bld: 6.1 %

## 2010-05-08 LAB — CONVERTED CEMR LAB
BUN: 39 mg/dL — ABNORMAL HIGH (ref 6–23)
Calcium: 9.9 mg/dL (ref 8.4–10.5)
Glucose, Bld: 104 mg/dL — ABNORMAL HIGH (ref 70–99)

## 2010-05-14 ENCOUNTER — Ambulatory Visit: Payer: Self-pay | Admitting: Family Medicine

## 2010-05-14 ENCOUNTER — Encounter: Payer: Self-pay | Admitting: Family Medicine

## 2010-05-14 LAB — CONVERTED CEMR LAB
Calcium: 9.3 mg/dL (ref 8.4–10.5)
Glucose, Bld: 113 mg/dL — ABNORMAL HIGH (ref 70–99)
Sodium: 136 meq/L (ref 135–145)

## 2010-05-24 ENCOUNTER — Ambulatory Visit: Payer: Self-pay | Admitting: Family Medicine

## 2010-05-24 ENCOUNTER — Encounter: Payer: Self-pay | Admitting: Family Medicine

## 2010-05-27 ENCOUNTER — Encounter: Payer: Self-pay | Admitting: Family Medicine

## 2010-05-27 LAB — CONVERTED CEMR LAB
CO2: 21 meq/L (ref 19–32)
Calcium: 9.2 mg/dL (ref 8.4–10.5)
Chloride: 106 meq/L (ref 96–112)
Sodium: 138 meq/L (ref 135–145)

## 2010-05-29 ENCOUNTER — Ambulatory Visit: Payer: Self-pay | Admitting: Cardiovascular Disease

## 2010-05-29 ENCOUNTER — Encounter: Payer: Self-pay | Admitting: Family Medicine

## 2010-05-29 ENCOUNTER — Inpatient Hospital Stay (HOSPITAL_COMMUNITY): Admission: EM | Admit: 2010-05-29 | Discharge: 2010-05-30 | Payer: Self-pay | Admitting: Emergency Medicine

## 2010-05-29 ENCOUNTER — Ambulatory Visit: Payer: Self-pay | Admitting: Family Medicine

## 2010-05-30 ENCOUNTER — Encounter: Payer: Self-pay | Admitting: Family Medicine

## 2010-06-06 ENCOUNTER — Ambulatory Visit: Payer: Self-pay | Admitting: Family Medicine

## 2010-06-06 ENCOUNTER — Encounter: Payer: Self-pay | Admitting: Family Medicine

## 2010-06-07 ENCOUNTER — Encounter: Payer: Self-pay | Admitting: Family Medicine

## 2010-06-07 LAB — CONVERTED CEMR LAB
ALT: 8 units/L (ref 0–35)
Albumin: 4.3 g/dL (ref 3.5–5.2)
CO2: 26 meq/L (ref 19–32)
Calcium: 9.2 mg/dL (ref 8.4–10.5)
Chloride: 105 meq/L (ref 96–112)
Creatinine, Ser: 1.5 mg/dL — ABNORMAL HIGH (ref 0.40–1.20)

## 2010-06-21 ENCOUNTER — Ambulatory Visit: Payer: Self-pay | Admitting: Family Medicine

## 2010-06-21 ENCOUNTER — Encounter: Payer: Self-pay | Admitting: Family Medicine

## 2010-06-21 LAB — CONVERTED CEMR LAB
CO2: 22 meq/L (ref 19–32)
Calcium: 9.6 mg/dL (ref 8.4–10.5)
Sodium: 139 meq/L (ref 135–145)

## 2010-07-11 ENCOUNTER — Encounter: Payer: Self-pay | Admitting: Cardiology

## 2010-07-16 ENCOUNTER — Ambulatory Visit: Payer: Self-pay | Admitting: Cardiology

## 2010-07-16 DIAGNOSIS — I4891 Unspecified atrial fibrillation: Secondary | ICD-10-CM

## 2010-07-17 ENCOUNTER — Encounter: Payer: Self-pay | Admitting: Internal Medicine

## 2010-07-17 ENCOUNTER — Telehealth (INDEPENDENT_AMBULATORY_CARE_PROVIDER_SITE_OTHER): Payer: Self-pay

## 2010-07-23 DIAGNOSIS — F329 Major depressive disorder, single episode, unspecified: Secondary | ICD-10-CM

## 2010-07-23 DIAGNOSIS — F411 Generalized anxiety disorder: Secondary | ICD-10-CM | POA: Insufficient documentation

## 2010-07-24 ENCOUNTER — Ambulatory Visit: Payer: Self-pay | Admitting: Internal Medicine

## 2010-07-24 DIAGNOSIS — M129 Arthropathy, unspecified: Secondary | ICD-10-CM | POA: Insufficient documentation

## 2010-07-26 ENCOUNTER — Ambulatory Visit (HOSPITAL_COMMUNITY): Admission: RE | Admit: 2010-07-26 | Discharge: 2010-07-26 | Payer: Self-pay | Admitting: Internal Medicine

## 2010-07-26 DIAGNOSIS — K760 Fatty (change of) liver, not elsewhere classified: Secondary | ICD-10-CM

## 2010-07-26 HISTORY — DX: Fatty (change of) liver, not elsewhere classified: K76.0

## 2010-08-13 ENCOUNTER — Ambulatory Visit: Payer: Self-pay | Admitting: Internal Medicine

## 2010-08-13 ENCOUNTER — Ambulatory Visit (HOSPITAL_COMMUNITY): Admission: RE | Admit: 2010-08-13 | Discharge: 2010-08-13 | Payer: Self-pay | Admitting: Internal Medicine

## 2010-08-13 DIAGNOSIS — Z860100 Personal history of colon polyps, unspecified: Secondary | ICD-10-CM

## 2010-08-13 HISTORY — DX: Personal history of colonic polyps: Z86.010

## 2010-08-13 HISTORY — DX: Personal history of colon polyps, unspecified: Z86.0100

## 2010-08-13 LAB — HM COLONOSCOPY

## 2010-08-14 ENCOUNTER — Encounter: Payer: Self-pay | Admitting: Internal Medicine

## 2010-08-17 ENCOUNTER — Encounter: Payer: Self-pay | Admitting: Family Medicine

## 2010-08-22 ENCOUNTER — Encounter: Payer: Self-pay | Admitting: Family Medicine

## 2010-08-30 ENCOUNTER — Encounter: Payer: Self-pay | Admitting: Family Medicine

## 2010-08-30 DIAGNOSIS — I509 Heart failure, unspecified: Secondary | ICD-10-CM | POA: Insufficient documentation

## 2010-09-24 ENCOUNTER — Encounter: Payer: Self-pay | Admitting: Family Medicine

## 2010-09-24 DIAGNOSIS — N184 Chronic kidney disease, stage 4 (severe): Secondary | ICD-10-CM

## 2010-09-24 DIAGNOSIS — N183 Chronic kidney disease, stage 3 (moderate): Secondary | ICD-10-CM

## 2010-09-24 HISTORY — DX: Chronic kidney disease, stage 4 (severe): N18.4

## 2010-09-26 ENCOUNTER — Encounter: Payer: Self-pay | Admitting: Family Medicine

## 2010-09-26 ENCOUNTER — Ambulatory Visit: Payer: Self-pay | Admitting: Family Medicine

## 2010-09-26 LAB — CONVERTED CEMR LAB
ALT: 10 units/L (ref 0–35)
AST: 20 units/L (ref 0–37)
Albumin: 4.3 g/dL (ref 3.5–5.2)
BUN: 19 mg/dL (ref 6–23)
Calcium: 9.7 mg/dL (ref 8.4–10.5)
Chloride: 104 meq/L (ref 96–112)
Hgb A1c MFr Bld: 6.1 %
Potassium: 4.8 meq/L (ref 3.5–5.3)
Sodium: 137 meq/L (ref 135–145)
Total Protein: 7.5 g/dL (ref 6.0–8.3)

## 2010-10-11 ENCOUNTER — Encounter (INDEPENDENT_AMBULATORY_CARE_PROVIDER_SITE_OTHER): Payer: Self-pay | Admitting: *Deleted

## 2010-10-17 ENCOUNTER — Ambulatory Visit: Payer: Self-pay | Admitting: Cardiology

## 2010-10-28 ENCOUNTER — Ambulatory Visit: Payer: Self-pay | Admitting: Cardiology

## 2010-10-30 ENCOUNTER — Ambulatory Visit: Payer: Self-pay | Admitting: Cardiology

## 2010-10-30 LAB — CONVERTED CEMR LAB
Basophils Absolute: 0 10*3/uL (ref 0.0–0.1)
Eosinophils Absolute: 0.1 10*3/uL (ref 0.0–0.7)
Lymphocytes Relative: 40 % (ref 12.0–46.0)
Monocytes Relative: 11.4 % (ref 3.0–12.0)
Neutrophils Relative %: 43.7 % (ref 43.0–77.0)
Platelets: 277 10*3/uL (ref 150.0–400.0)
RDW: 14.9 % — ABNORMAL HIGH (ref 11.5–14.6)

## 2010-11-04 ENCOUNTER — Encounter (INDEPENDENT_AMBULATORY_CARE_PROVIDER_SITE_OTHER): Payer: Self-pay | Admitting: *Deleted

## 2010-11-04 ENCOUNTER — Ambulatory Visit: Payer: Self-pay | Admitting: Cardiology

## 2010-11-04 LAB — CONVERTED CEMR LAB
Iron: 117 ug/dL (ref 42–145)
Transferrin: 382.9 mg/dL — ABNORMAL HIGH (ref 212.0–360.0)
Vitamin B-12: 256 pg/mL (ref 211–911)

## 2010-11-08 ENCOUNTER — Ambulatory Visit: Payer: Self-pay | Admitting: Cardiology

## 2010-11-12 ENCOUNTER — Encounter (INDEPENDENT_AMBULATORY_CARE_PROVIDER_SITE_OTHER): Payer: Self-pay | Admitting: *Deleted

## 2010-11-12 ENCOUNTER — Ambulatory Visit (HOSPITAL_COMMUNITY)
Admission: RE | Admit: 2010-11-12 | Discharge: 2010-11-12 | Payer: Self-pay | Source: Home / Self Care | Admitting: Cardiology

## 2010-11-12 LAB — CONVERTED CEMR LAB: OCCULT 3: NEGATIVE

## 2010-11-13 ENCOUNTER — Encounter: Payer: Self-pay | Admitting: Family Medicine

## 2010-11-18 ENCOUNTER — Telehealth: Payer: Self-pay | Admitting: Cardiology

## 2010-11-19 ENCOUNTER — Telehealth: Payer: Self-pay | Admitting: Cardiology

## 2010-11-28 ENCOUNTER — Ambulatory Visit: Payer: Self-pay | Admitting: Cardiology

## 2010-11-28 ENCOUNTER — Encounter: Payer: Self-pay | Admitting: Cardiology

## 2010-11-28 ENCOUNTER — Ambulatory Visit: Payer: Self-pay

## 2010-11-28 DIAGNOSIS — Z950 Presence of cardiac pacemaker: Secondary | ICD-10-CM

## 2010-12-11 ENCOUNTER — Ambulatory Visit: Payer: Self-pay | Admitting: Cardiology

## 2010-12-11 LAB — CONVERTED CEMR LAB

## 2010-12-18 ENCOUNTER — Ambulatory Visit: Admission: RE | Admit: 2010-12-18 | Discharge: 2010-12-18 | Payer: Self-pay | Source: Home / Self Care

## 2010-12-18 LAB — CONVERTED CEMR LAB: POC INR: 1.8

## 2010-12-23 ENCOUNTER — Ambulatory Visit: Admission: RE | Admit: 2010-12-23 | Discharge: 2010-12-23 | Payer: Self-pay | Source: Home / Self Care

## 2010-12-23 DIAGNOSIS — M6789 Other specified disorders of synovium and tendon, multiple sites: Secondary | ICD-10-CM | POA: Insufficient documentation

## 2010-12-25 ENCOUNTER — Ambulatory Visit: Admission: RE | Admit: 2010-12-25 | Discharge: 2010-12-25 | Payer: Self-pay | Source: Home / Self Care

## 2010-12-25 ENCOUNTER — Encounter: Payer: Self-pay | Admitting: Internal Medicine

## 2010-12-25 LAB — CONVERTED CEMR LAB: POC INR: 2.8

## 2011-01-06 ENCOUNTER — Ambulatory Visit: Admission: RE | Admit: 2011-01-06 | Discharge: 2011-01-06 | Payer: Self-pay | Source: Home / Self Care

## 2011-01-06 ENCOUNTER — Ambulatory Visit
Admission: RE | Admit: 2011-01-06 | Discharge: 2011-01-06 | Payer: Self-pay | Source: Home / Self Care | Attending: Internal Medicine | Admitting: Internal Medicine

## 2011-01-06 ENCOUNTER — Other Ambulatory Visit: Payer: Self-pay | Admitting: Internal Medicine

## 2011-01-06 LAB — T4, FREE: Free T4: 1 ng/dL (ref 0.60–1.60)

## 2011-01-06 LAB — HEPATIC FUNCTION PANEL
ALT: 17 U/L (ref 0–35)
Albumin: 3.7 g/dL (ref 3.5–5.2)
Alkaline Phosphatase: 52 U/L (ref 39–117)
Bilirubin, Direct: 0.1 mg/dL (ref 0.0–0.3)
Total Bilirubin: 0.3 mg/dL (ref 0.3–1.2)

## 2011-01-06 LAB — CONVERTED CEMR LAB: POC INR: 5.9

## 2011-01-09 ENCOUNTER — Encounter (INDEPENDENT_AMBULATORY_CARE_PROVIDER_SITE_OTHER): Payer: Self-pay | Admitting: *Deleted

## 2011-01-13 ENCOUNTER — Ambulatory Visit: Admission: RE | Admit: 2011-01-13 | Discharge: 2011-01-13 | Payer: Self-pay | Source: Home / Self Care

## 2011-01-13 LAB — CONVERTED CEMR LAB: POC INR: 3.5

## 2011-01-14 NOTE — Letter (Signed)
Summary: Patient [facility] Biopsy Results  [facility]  [place], [place] [address]   Phone: [phone]  Fax: [phone]        August 14, 2010 MRN: [id number]    [name] [address] [place], [place]  [address]    Dear [name],  I am pleased to inform you that the biopsies taken during your recent endoscopic examination did not show any evidence of cancer upon pathologic examination.The stomach biopsies showed mild inflammation due to gastritis  Additional information/recommendations:  __No further action is needed at this time.  Please follow-up with      your primary care physician for your other healthcare needs.  __ Please call [phone] to schedule a return visit to review      your condition.  _x_ Continue with the treatment plan as outlined on the day of your      exam.  _   Please call us if you are having persistent problems or have questions about your condition that have not been fully answered at this time.  Sincerely,  [name] MD  This letter has been electronically signed by your physician.  Appended Document: Patient Notice-Endo Biopsy Results letter mailed to patient's home

## 2011-01-14 NOTE — Miscellaneous (Signed)
Summary: Updating problem list   Clinical Lists Changes  Problems: Added new problem of CHRONIC KIDNEY DISEASE STAGE III (MODERATE) (ICD-585.3) Removed problem of RENAL INSUFFICIENCY, CHRONIC (ICD-585.9)

## 2011-01-14 NOTE — Miscellaneous (Signed)
Summary: Updated problem list   Clinical Lists Changes  Problems: Removed problem of ASTHMA, PERSISTENT, MILD (ICD-493.90) Added new problem of ASTHMA, PERSISTENT (ICD-493.90)

## 2011-01-14 NOTE — Assessment & Plan Note (Signed)
Summary: f33m  Medications Added * ASA(NSAID) 1 tab once daily COENZYME Q10 100 MG CAPS (COENZYME Q10) 1 tab as needed      Allergies Added:   Visit Type:  Follow-up/pacer will be check Primary Provider:  Eugenie Norrie  MD  CC:  pt had eposide of fluttering this week.  History of Present Illness: the patient is 65 years old and return for management of hypertrophic cardio myopathy and pacemaker. She has a documented hypertrophic heart myopathy and has had diastolic heart failure. She has normal coronary angiography. She also had a DDD pacemaker implanted for complete AV block with a generator change in 19 [id number] 2004. She has had some migration of her pacemaker was some tension on her leads based on her chest x-ray.  She says she's been doing fairly well recently. She has had some feeling of skipping which he relates to when she smokes. On portion and she is still smoking. Her shortness of breath has been about the same and she's not had any chest pain.  We did an echocardiogram after her last visit and her wall thickness was less than before. Her septal thickness was 12.8 and a posterior wall thickness was 8.3 whereas previously it was documented at 18.6 and 17.2. Dementia which is correct.  Her past history is also significant for hypertension hyperlipidemia.  Current Medications (verified): 1)  Crestor 10 Mg Tabs (Rosuvastatin Calcium) .[name].. 1 Tablet By Mouth Every Night 2)  Klor-Con M20 20 Meq Tbcr (Potassium Chloride Crys Cr) .... Take 1 Tablet By Mouth Once A Day 3)  Xanax 0.5 Mg Tabs (Alprazolam) .... One By Mouth Bed As Needed Anxiety 4)  Tylenol Extra Strength 500 Mg  Tabs (Acetaminophen) .... Take One Tablet Every Six Hours For Belly/side Pain 5)  Proair Hfa 108 (90 Base) Mcg/act  Aers (Albuterol Sulfate) .... Two Puffs Every 4-6 Hours As Needed 6)  Lopressor 50 Mg  Tabs (Metoprolol Tartrate) .... One By Mouth Bid 7)  Cardizem Cd 180 Mg Xr24h-Cap (Diltiazem Hcl Coated Beads)  .... One By Mouth Daily 8)  Furosemide 40 Mg Tabs (Furosemide) .... One By Mouth Daily 9)  Centrum  Tabs (Multiple Vitamins-Minerals) .... One By Mouth Daily 10)  Fish Oil  Oil (Fish Oil) .... Daily 11)  Ventolin Hfa 108 (90 Base) Mcg/act Aers (Albuterol Sulfate) .... 2 Puffs Q 4 Hoiurs 12)  Co Q-10 30 Mg  Caps (Coenzyme Q10) .... Daily 13)  Otc Laxatives .... As Needed 14)  Nortriptyline Hcl 50 Mg Caps (Nortriptyline Hcl) .... One By Mouth At Night 15)  Asa(Nsaid) .[name].. 1 Tab Once Daily 16)  Coenzyme Q10 100 Mg Caps (Coenzyme Q10) .[name].. 1 Tab As Needed  Allergies (verified): 1)  ! Ace Inhibitors  Past History:  Past Medical History: Reviewed history from 04/16/2009 and no changes required.  1. Panic attacks 2. Chronic asthma. 3. Hypertrophic cardiomyopathy. 4. Diastolic heart failure, now euvolemic. 5. Normal coronary angiography. 6. Status post dual-mode, dual-pacing, dual-sensing pacemaker for     complete atrioventricular block, gen change 1998, 2004 7. Hypertension. 8. Hyperlipidemia. 9. Continued cigarette use.  10. Hx angioedema due to ACEI  Review of Systems       ROS is negative except as outlined in HPI.   Vital Signs:  Patient profile:   65 year old female Height:      64 inches Weight:      180 pounds BMI:     31.01 Pulse rate:   69 / minute BP  sitting:   128 / 89  (left arm) Cuff size:   regular  Vitals Entered By: Lubertha Basque, CNA (January 17, 2010 10:56 AM)  Physical Exam  Additional Exam:  Gen. Well-nourished, in no distress   Neck: No JVD, thyroid not enlarged, no carotid bruits Lungs: No tachypnea, clear without rales, rhonchi or wheezes Cardiovascular: Rhythm regular, PMI not displaced,  heart sounds  normal, grade 2/6 systolic ejection murmur at the left sternal, no peripheral edema, pulses normal in all 4 extremities. Abdomen: BS normal, abdomen soft and non-tender without masses or organomegaly, no hepatosplenomegaly. MS: No deformities, no  cyanosis or clubbing   Neuro:  No focal sns   Skin:  no lesions    PPM Specifications Following MD:  Vanna Scotland. [name], MD     PPM Vendor:  [employer]     PPM Model Number:  [id number]     PPM Serial Number:  [id number] PPM DOI:  11/17/2003     PPM Implanting MD:  Vanna Scotland. [name], MD  Lead 1    Location: RA     DOI: 07/08/1985     Model #: [id number]     Serial #: [id number]     Status: active Lead 2    Location: RV     DOI: 07/08/1985     Model #: [id number]     Serial #: [id number]     Status: active   Indications:  Complete Heart Block   PPM Follow Up Remote Check?  No Battery Voltage:  2.72 V     Battery Est. Longevity:  1 year     Pacer Dependent:  Yes       PPM Device Measurements Atrium  Amplitude: 0.5 mV, Impedance: 470 ohms, Threshold: 1.5 V at 0.8 msec Right Ventricle  Impedance: 565 ohms, Threshold: 0.75 V at 0.5 msec  Episodes MS Episodes:  0     Percent Mode Switch:  0     Coumadin:  No Atrial Pacing:  100%     Ventricular Pacing:  100%  Parameters Mode:  DDDR     Lower Rate Limit:  70     Upper Rate Limit:  120 Paced AV Delay:  170     Sensed AV Delay:  150 Next Cardiology Appt Due:  07/15/2010 Tech Comments:  No parameter changes.   Device function normal.  Battery near ERI and patient is dependent with estimated longevity 1 year.  ROV 6 months with Dr. Truddie Crumble, LPN  February  3, 624THL 11:30 AM   Impression & Recommendations:  Problem # 1:  HYPERTROPHIC OBSTRUCTIVE CARDIOMYOPATHY (ICD-425.1) She has a hypertrophic cardiomyopathy. Extremities her last echocardiogram showed much less thickening of the septum and posterior wall but not sure this was anchored. This appears stable. The following medications were removed from the medication list:    Aspirin Adult Low Strength 81 Mg Tbec (Aspirin) ..... One by mouth daily Her updated medication list for this problem includes:    Lopressor 50 Mg Tabs (Metoprolol tartrate) ..... One by mouth bid    Cardizem Cd 180 Mg Xr24h-cap (Diltiazem  hcl coated beads) ..... One by mouth daily    Furosemide 40 Mg Tabs (Furosemide) ..... One by mouth daily  Problem # 2:  DIASTOLIC HEART FAILURE, CHRONIC (ICD-428.32) She has had diastolic heart failure related to her hypertrophic cardiomyopathy. She appears euvolemic today and we will continue her same diuretic dose. She had recent laboratory studies which were good. The following medications  were removed from the medication list:    Aspirin Adult Low Strength 81 Mg Tbec (Aspirin) ..... One by mouth daily Her updated medication list for this problem includes:    Lopressor 50 Mg Tabs (Metoprolol tartrate) ..... One by mouth bid    Cardizem Cd 180 Mg Xr24h-cap (Diltiazem hcl coated beads) ..... One by mouth daily    Furosemide 40 Mg Tabs (Furosemide) ..... One by mouth daily  Problem # 3:  PACEMAKER (ICD-V45.[name]01) She has a DDD pacemaker for complete AV block and is pacer dependent. She has had some tension on her atrial and ventricular leads due to migration of the pacemaker in the pocket. We will recheck her thresholds today.  Problem # 4:  BRONCHITIS, CHRONIC, ACUTE EXACERBATION (ICD-491.21) She hnf an intermittent cough related to bronchitis. Unfortunately she has continued to smoke. I encouraged her to work on cutting down and cutting out her smoking. Her updated medication list for this problem includes:    Proair Hfa 108 (90 Base) Mcg/act Aers (Albuterol sulfate) .[name]..[name] Two puffs every 4-6 hours as needed    Ventolin Hfa 108 (90 Base) Mcg/act Aers (Albuterol sulfate) .[name]... 2 puffs q 4 hoiurs  Problem # 5:  HYPERTENSION, BENIGN SYSTEMIC (ICD-401.1) This appears controlled on current medications. The following medications were removed from the medication list:    Aspirin Adult Low Strength 81 Mg Tbec (Aspirin) ..... One by mouth daily Her updated medication list for this problem includes:    Lopressor 50 Mg Tabs (Metoprolol tartrate) ..... One by mouth bid    Cardizem Cd 180 Mg Xr24h-cap  (Diltiazem hcl coated beads) ..... One by mouth daily    Furosemide 40 Mg Tabs (Furosemide) ..... One by mouth daily  Patient Instructions: 1)  Your physician wants you to follow-up in: 6 months.   You will receive a reminder letter in the mail two months in advance. If you don't receive a letter, please call our office to schedule the follow-up appointment.

## 2011-01-14 NOTE — Miscellaneous (Signed)
Summary: chart update  Pt can be challenging to see because of her significant PMHx and frequent vague complaints that don't have much clinical support. Taking benzos since before I met her. Have tried to wean but patient has been reluctant. Does drink alcohol but I have not seen signs of dependence or abuse in my encounters with her.      Clinical Lists Changes  Problems: Removed problem of VAGINAL PRURITUS (ICD-698.1) Removed problem of DISTURBANCE OF SKIN SENSATION (ICD-782.0) Medications: Removed medication of COENZYME Q10 100 MG CAPS (COENZYME Q10) 1 tab as needed Removed medication of FLUCONAZOLE 150 MG TABS (FLUCONAZOLE) one by mouth x 1 then repeat in 72 hours Observations: Added new observation of PAST SURG HX: Cardiac cath 5/09: normal angiography with LVH c/w hypertrophic CM. Normal PAW of 6.   Cardiolite 9/00 - neg ischemia - 04/30/2000,  s/p BTL - 04/01/2000,  s/p pacemaker placement -1998 - (05/27/2010 11:19) Added new observation of PAST MED HX:  1. Panic attacks 2. Chronic asthma. 3. Hypertrophic cardiomyopathy - followd by Dr. [name] at [facility] 4. Diastolic heart failure, now euvolemic. 5. Normal coronary angiography. 6. Status post dual-mode, dual-pacing, dual-sensing pacemaker for     complete atrioventricular block, gen change 1998, 2004 7. Hypertension. 8. Hyperlipidemia. 9. Continued cigarette use.  10. Hx angioedema due to ACEI (05/27/2010 11:19) Added new observation of PRIMARY MD: Eugenie Norrie  MD (05/27/2010 11:19)       Past History:  Past Medical History:  1. Panic attacks 2. Chronic asthma. 3. Hypertrophic cardiomyopathy - followd by Dr. [name] at [facility] 4. Diastolic heart failure, now euvolemic. 5. Normal coronary angiography. 6. Status post dual-mode, dual-pacing, dual-sensing pacemaker for     complete atrioventricular block, gen change 1998, 2004 7. Hypertension. 8. Hyperlipidemia. 9. Continued cigarette use.  10. Hx angioedema  due to ACEI  Past Surgical History: Cardiac cath 5/09: normal angiography with LVH c/w hypertrophic CM. Normal PAW of 6.   Cardiolite 9/00 - neg ischemia - 04/30/2000,  s/p BTL - 04/01/2000,  s/p pacemaker placement -1998 -

## 2011-01-14 NOTE — Progress Notes (Signed)
Summary: Schedule and office visit   ---- Converted from flag ---- ---- 07/16/2010 10:47 PM, [name] MD wrote: Alden Server, Can You, please, work this pt into my schedule soon? thanx, and please call her with an appointment ------------------------------  Phone Note Outgoing Call Call back at [facility] Phone [phone]   Call placed by: Barb Merino RN, CGRN,  July 17, 2010 9:22 AM Call placed to: Patient Summary of Call: I have left the patient a message.  Patient  is scheduled for an office visit with Dr Delfin Edis for 07/24/10 at 2:00.  I will send her a new patient packet and letter Initial call taken by: Barb Merino RN, [place],  July 17, 2010 9:22 AM

## 2011-01-14 NOTE — Assessment & Plan Note (Signed)
Summary: [name],df   Vital Signs:  Patient profile:   65 year old female Weight:      181.2 pounds Temp:     98.5 degrees F oral Pulse rate:   69 / minute Pulse rhythm:   regular BP sitting:   110 / 77  (right arm) Cuff size:   large  Vitals Entered By: Audelia Hives CMA (June 06, 2010 3:01 PM)  Primary Care Provider:  Eugenie Norrie  MD   History of Present Illness: 1. [name] Recently admitted 6/14 - 6/16 for CHF exacerbation. Had been off lasix due to elevated Cr. Pt noted increased abd and LE swelling so went to ED where CXR and exam showed CHF. Discharge summary is not up yet so can't refer to those notes. Apparetnly ACEi was stopped and pt reports that metoprolol was also held. Is continuing to take lasix at this time.   Has an appointment with Dr. Nichola Sizer office in July or August.   Echo 6/16 showed:  - Left ventricle: The cavity size was moderately dilated. Wall     thickness was increased in a pattern of moderate LVH. Systolic     function was normal. The estimated ejection fraction was in the     range of 55% to 60%.   - Mitral valve: Mild regurgitation.   - Left atrium: The atrium was mildly dilated.   - Atrial septum: No defect or patent foramen ovale was identified.   - Pulmonary arteries: PA peak pressure: 21mm Hg (S).   - Impressions: No SAM or LVOT gardient when patient in paced rhythm     during study.   Allergies: 1)  ! Ace Inhibitors  Past History:  Past Surgical History: Cardiac cath 5/09: normal angiography with LVH c/w hypertrophic CM. Normal PAW of 6.   Cardiolite 9/00 - neg ischemia - 04/30/2000,  s/p BTL - 04/01/2000,  s/p pacemaker placement -1998 -  echo 05/30/10: Left ventricle: The cavity size was moderately dilated. Wall     thickness was increased in a pattern of moderate LVH. Systolic     function was normal. The estimated ejection fraction was in the     range of 55% to 60%.  Review of Systems       deneis chest pain, SOB, LE, polyuria;  occasionally gets dizzy with standing.   Physical Exam  General:  Vital signs reviewed: overweight but otherwise normal Alert, appropriate. No acute distress  Mouth:  oropharynx pink, moist; no erythema or exudate  Neck:   no JVD or HJR Lungs:  work of breathing unlabored, clear to auscultation bilaterally; no wheezes, rales, or ronchi; good air movement throughout  Heart:  regular rate and rhythm, no murmurs; normal s1/s2  Extremities:  no LE edema bilaterally. 2+ DP pulses   Impression & Recommendations:  Problem # 1:  CHF (ICD-428.0) Assessment Improved likely due to d/c of lasix and fluid overload. Will check Cr today back on the lasix. Not sure why beta blocker was d/c'd after discharge fromthe hospital. Will restart b blocker at lower dose. Echo reassuring in the hospital. Asked pt to mention the change in her metoprolol dose when she sees Dr. [name].   Her updated medication list for this problem includes:    Metoprolol Tartrate 25 Mg Tabs (Metoprolol tartrate) ..... One by mouth two times a day    Furosemide 40 Mg Tabs (Furosemide) ..... One by mouth daily  Orders: Comp [facility] [id number]) [facility]- Est Level  3 [id number])  Complete  Medication List: 1)  Crestor 10 Mg Tabs (Rosuvastatin calcium) .[name].. 1 tablet by mouth every night 2)  Klor-con M20 20 Meq Tbcr (Potassium chloride crys cr) .... Take 1 tablet by mouth once a day 3)  Xanax 0.5 Mg Tabs (Alprazolam) .... One by mouth bed as needed anxiety 4)  Tylenol Extra Strength 500 Mg Tabs (Acetaminophen) .... Take one tablet every six hours for belly/side pain 5)  Proair Hfa 108 (90 Base) Mcg/act Aers (Albuterol sulfate) .... Two puffs every 4-6 hours as needed 6)  Metoprolol Tartrate 25 Mg Tabs (Metoprolol tartrate) .... One by mouth two times a day 7)  Cardizem Cd 180 Mg Xr24h-cap (Diltiazem hcl coated beads) .... One by mouth daily 8)  Furosemide 40 Mg Tabs (Furosemide) .... One by mouth daily 9)  Centrum Tabs (Multiple  vitamins-minerals) .... One by mouth daily 10)  Fish Oil Oil (Fish oil) .... Daily 11)  Ventolin Hfa 108 (90 Base) Mcg/act Aers (Albuterol sulfate) .... 2 puffs q 4 hoiurs 12)  Co Q-10 30 Mg Caps (Coenzyme q10) .... Daily 13)  Otc Laxatives  .... As needed 14)  Asa(nsaid)  .[name].. 1 tab once daily  Patient Instructions: 1)  take the lasix (furosemide) once a day 2)  decrease  the metoprolol to 25 mg two times a day (1/2 of the 50 mg tablet two times a day) 3)  stop the flovent 4)  call for any shortness of breath or chest pain. 5)  we'll do some blood work today to make sure your kidneys look okay. Prescriptions: VENTOLIN HFA 108 (90 BASE) MCG/ACT AERS (ALBUTEROL SULFATE) 2 puffs q 4 hoiurs  #1 x 0   Entered and Authorized by:   Eugenie Norrie  MD   Signed by:   Eugenie Norrie  MD on 06/06/2010   Method used:   Print then Give to Patient   RxID:   [id number] FUROSEMIDE 40 MG TABS (FUROSEMIDE) one by mouth daily  #30.0 Each x 5   Entered and Authorized by:   Eugenie Norrie  MD   Signed by:   Eugenie Norrie  MD on 06/06/2010   Method used:   Print then Give to Patient   RxID:   [id number] CARDIZEM CD 180 MG XR24H-CAP (DILTIAZEM HCL COATED BEADS) one by mouth daily  #90 x 1   Entered and Authorized by:   Eugenie Norrie  MD   Signed by:   Eugenie Norrie  MD on 06/06/2010   Method used:   Print then Give to Patient   RxID:   [id number] Duanne Moron 0.5 MG TABS (ALPRAZOLAM) one by mouth bed as needed anxiety  #30 x 0   Entered and Authorized by:   Eugenie Norrie  MD   Signed by:   Eugenie Norrie  MD on 06/06/2010   Method used:   Print then Give to Patient   RxID:   [id number] KLOR-CON M20 20 MEQ TBCR (POTASSIUM CHLORIDE CRYS CR) Take 1 tablet by mouth once a day  #30.0 Each x 6   Entered and Authorized by:   Eugenie Norrie  MD   Signed by:   Eugenie Norrie  MD on 06/06/2010   Method used:   Print then Give to Patient   RxID:   [id number] CRESTOR 10 MG TABS  (ROSUVASTATIN CALCIUM) 1 tablet by mouth every night  #30 x 12   Entered and Authorized by:   Eugenie Norrie  MD   Signed by:   Eugenie Norrie  MD on  06/06/2010   Method used:   Print then Give to Patient   RxID:   [id number] METOPROLOL TARTRATE 25 MG TABS (METOPROLOL TARTRATE) one by mouth two times a day  #60 x 0   Entered and Authorized by:   Eugenie Norrie  MD   Signed by:   Eugenie Norrie  MD on 06/06/2010   Method used:   Print then Give to Patient   RxID:   [id number]

## 2011-01-14 NOTE — Assessment & Plan Note (Signed)
Summary: 3 month rov  Medications Added PRADAXA 150 MG CAPS (DABIGATRAN ETEXILATE MESYLATE) take one tab by mouth two times a day AMIODARONE HCL 400 MG TABS (AMIODARONE HCL) Take one tablet by mouth twice a dayfor 1 week, then take one tablet by mouth daily      Allergies Added:   Referring Provider:  Dr Eustace Quail Primary Provider:  Dr Sarita Haver   History of Present Illness: And the patient is 65 years old and retired from [employer] of hypertrophic cardiomyopathy and pacemaker. She has a DDD pacemaker implanted for AV block and had a generator change in 2004. She is pacer dependent. She also has hypertrophic cardiomyopathy. She has a history of diastolic heart failure and history of normal cornea conjunctivae. Her pacemaker leads have been somewhat tight due to some migration of the pacemaker generator in the pocket.  Today she was found to be in atrial fibrillation. She says she has had some increased symptoms of fatigue over the past several months and decreased energy levels.  Current Medications (verified): 1)  Crestor 10 Mg Tabs (Rosuvastatin Calcium) .[name].. 1 Tablet By Mouth Every Night 2)  Klor-Con M20 20 Meq Tbcr (Potassium Chloride Crys Cr) .... Take 1 Tablet By Mouth Once A Day 3)  Xanax 0.5 Mg Tabs (Alprazolam) .... One By Mouth Bed As Needed Anxiety 4)  Tylenol Extra Strength 325 Mg  Tabs (Acetaminophen) .... Take One Tablet Every Six Hours For Belly/side Pain 5)  Proair Hfa 108 (90 Base) Mcg/act  Aers (Albuterol Sulfate) .... Two Puffs Every 4-6 Hours As Needed 6)  Carvedilol 12.5 Mg Tabs (Carvedilol) .... Take One Tablet By Mouth Twice A Day 7)  Cardizem Cd 180 Mg Xr24h-Cap (Diltiazem Hcl Coated Beads) .... One By Mouth Daily 8)  Furosemide 40 Mg Tabs (Furosemide) .... One By Mouth Daily 9)  Otc Laxatives .... As Needed 10)  Aspirin 81 Mg Tbec (Aspirin) .... Take 1 Tablet By Mouth Once Daily 11)  Omeprazole 20 Mg Cpdr (Omeprazole) .... Daily 12)  Flovent Hfa 44 Mcg/act  Aero (Fluticasone Propionate  Hfa) .... As Directed  Allergies (verified): 1)  ! Ace Inhibitors  Past History:  Past Medical History: Reviewed history from 05/27/2010 and no changes required.  1. Panic attacks 2. Chronic asthma. 3. Hypertrophic cardiomyopathy - followd by Dr. [name] at [facility] 4. Diastolic heart failure, now euvolemic. 5. Normal coronary angiography. 6. Status post dual-mode, dual-pacing, dual-sensing pacemaker for     complete atrioventricular block, gen change 1998, 2004 7. Hypertension. 8. Hyperlipidemia. 9. Continued cigarette use.  10. Hx angioedema due to ACEI  Review of Systems       ROS is negative except as outlined in HPI.   Vital Signs:  Patient profile:   65 year old female Height:      65 inches Weight:      180.50 pounds Pulse rate:   69 / minute Resp:     18 per minute BP sitting:   141 / 89  (right arm)  Physical Exam  Additional Exam:  Gen. Well-nourished, in no distress   Neck: No JVD, thyroid not enlarged, no carotid bruits Lungs: No tachypnea, clear without rales, rhonchi or wheezes Cardiovascular: Rhythm regular, PMI not displaced,  heart sounds  normal, no murmurs or gallops, no peripheral edema, pulses normal in all 4 extremities. Abdomen: BS normal, abdomen soft and non-tender without masses or organomegaly, no hepatosplenomegaly. MS: No deformities, no cyanosis or clubbing   Neuro:  No focal sns  Skin:  no lesions    PPM Specifications Following MD:  Vanna Scotland. [name], MD     PPM Vendor:  [employer]     PPM Model Number:  [id number]     PPM Serial Number:  [id number] PPM DOI:  11/17/2003     PPM Implanting MD:  Vanna Scotland. [name], MD  Lead 1    Location: RA     DOI: 07/08/1985     Model #: [id number]     Serial #: [id number]     Status: active Lead 2    Location: RV     DOI: 07/08/1985     Model #: [id number]     Serial #: [id number]     Status: active   Indications:  Complete Heart Block   PPM Follow Up Remote Check?  No Battery Voltage:  2.66 V      Battery Est. Longevity:  0.25 years     Pacer Dependent:  Yes       PPM Device Measurements Atrium  Amplitude: 0.75 mV, Impedance: 431 ohms,  Right Ventricle  Impedance: 487 ohms, Threshold: 1.0 V at 0.5 msec  Episodes Percent Mode Switch:  99%     Coumadin:  No Atrial Pacing:  29%     Ventricular Pacing:  100%  Parameters Mode:  DDDR     Lower Rate Limit:  70     Upper Rate Limit:  120 Paced AV Delay:  170     Sensed AV Delay:  150 Tech Comments:  A-flutter/fib, starting on Pradaxa today.   No parameter changes.  Battery near ERI.  ROV 3 weeks with Dr.Brodie.   Checked by [employer]. Alma Friendly, LPN  November  3, 624THL 4:06 PM   Impression & Recommendations:  Problem # 1:  ATRIAL FIBRILLATION (ICD-427.31) She has recurrent atrial fibrillation. Since her last interrogation she has been in atrial fibrillation about 99% of the time. Last August when she was interrogated she had been in atrial fibrillation about 50% of the time. Considering that she has IHSS I think it is probably important that we try to restore sinus rhythm and maintained sinus rhythm. We will start her on amiodarone 400 mg b.i.d. for a week and then 400 mg daily. Will also start her on Primaxin b.i.d. I'll plan to see her back in 3-4 weeks and we'll plan cardioversion after that. Her updated medication list for this problem includes:    Carvedilol 12.5 Mg Tabs (Carvedilol) .[name]... Take one tablet by mouth twice a day    Aspirin 81 Mg Tbec (Aspirin) .[name]... Take 1 tablet by mouth once daily  Her updated medication list for this problem includes:    Carvedilol 12.5 Mg Tabs (Carvedilol) .[name]... Take one tablet by mouth twice a day    Aspirin 81 Mg Tbec (Aspirin) .[name]... Take 1 tablet by mouth once daily    Amiodarone Hcl 400 Mg Tabs (Amiodarone hcl) .[name]... Take one tablet by mouth twice a dayfor 1 week, then take one tablet by mouth daily  Problem # 2:  CHRONIC DIASTOLIC HEART FAILURE (0000000) Shas a history of diastolic CHF.  She had slight JVD today but no other evidence of iron overload. We will continue her current medications. Her updated medication list for this problem includes:    Carvedilol 12.5 Mg Tabs (Carvedilol) .[name]... Take one tablet by mouth twice a day    Cardizem Cd 180 Mg Xr24h-cap (Diltiazem hcl coated beads) ..... One by mouth daily    Furosemide  40 Mg Tabs (Furosemide) ..... One by mouth daily    Aspirin 81 Mg Tbec (Aspirin) .[name]... Take 1 tablet by mouth once daily    Amiodarone Hcl 400 Mg Tabs (Amiodarone hcl) .[name]... Take one tablet by mouth twice a dayfor 1 week, then take one tablet by mouth daily  Problem # 3:  HYPERTROPHIC OBSTRUCTIVE CARDIOMYOPATHY (ICD-425.1) She has hypertrophic cardiomyopathy. This is stable but the recent atrial fibrillation could lead to decompensation. Her updated medication list for this problem includes:    Carvedilol 12.5 Mg Tabs (Carvedilol) .[name]... Take one tablet by mouth twice a day    Cardizem Cd 180 Mg Xr24h-cap (Diltiazem hcl coated beads) ..... One by mouth daily    Furosemide 40 Mg Tabs (Furosemide) ..... One by mouth daily    Aspirin 81 Mg Tbec (Aspirin) .[name]... Take 1 tablet by mouth once daily    Amiodarone Hcl 400 Mg Tabs (Amiodarone hcl) .[name]... Take one tablet by mouth twice a dayfor 1 week, then take one tablet by mouth daily  Problem # 4:  PACEMAKER, PERMANENT (ICD-V45.01) She has a DDD pacemaker. The leads are somewhat retracted and when she has her generator placed and may need to be moved in to provide some slack. Her estimated longevity is a little more than a year so we'll plan to check her again in 3 months. She is pacer dependent.  Patient Instructions: 1)  Start Pradaxa 150mg  two times a day. 2)  Start Amiodarone 400mg  two times a day for 1 week, then take 400mg  once daily. 3)  Labwork in 7-10 days: cbc (427.31). 4)  Your physician recommends that you schedule a follow-up appointment in: 3 weeks. Prescriptions: AMIODARONE HCL 400 MG TABS  (AMIODARONE HCL) Take one tablet by mouth twice a dayfor 1 week, then take one tablet by mouth daily  #35 x 6   Entered by:   Alvis Lemmings, RN, BSN   Authorized by:   Fatima Sanger, MD, [facility]   Signed by:   Alvis Lemmings, RN, BSN on 10/17/2010   Method used:   Electronically to        [facility] [address] [phone]* (retail)       [address]       [address]       [place], [place]  [address]       Ph: [phone]       Fax: [phone]   RxIDTH:[id number] PRADAXA 150 MG CAPS (DABIGATRAN ETEXILATE MESYLATE) take one tab by mouth two times a day  #36 x 0   Entered by:   Alvis Lemmings, RN, BSN   Authorized by:   Fatima Sanger, MD, [facility]   Signed by:   Fatima Sanger, MD, [facility] on 10/17/2010   Method used:   Samples Given   RxID:   [id number]

## 2011-01-14 NOTE — Letter (Signed)
Summary: Generic Letter  Press photographer, [place]  [address] [place]   [place], [place] [address]   Phone: [phone]  Fax: [phone]        November 12, 2010 MRN: [id number]    [name] [place], [place]  [address]    Dear Ms. [name],  This is just to inform you that the stool cards you submitted to our office were negative for blood. If you have any questions, please feel free to contact us.         Sincerely,  Eustace Quail, MD Alvis Lemmings, RN, BSN  This letter has been electronically signed by your physician.

## 2011-01-14 NOTE — Assessment & Plan Note (Signed)
Summary: dysphagia/sheri    History of Present Illness Visit Type: Initial Consult Primary GI MD: Delfin Edis MD Primary Verlene Glantz: Dr Sarita Haver Requesting Barton Want: Dr Eustace Quail Chief Complaint: Patient c/o 6 months dysphagia to pills and some solid foods. She also c/o increased belching, bloating and heartburn. She denies any abdominal pain. She has had some nausea but no vomiting. History of Present Illness:   This is a 65 year old African American female who was referred by Dr. Eustace Quail for evaluation of dysphagia and dyspepsia. She has had a history of aortic stenosis and is status post pacemaker insertion for AV block, hypertrophic cardiomyopathy and recent congestive heart failure. She has chronic renal insufficiency as well. Her complaints are mostly bloating and early satiety as well as occasional dysphagia to solids. She was just started on Prilosec 20 mg daily about 3 weeks ago but has not noticed any significant improvement. She denies any personal or family history of gallbladder disease. She underwent a screening colonoscopy in 2005 with findings of 2 adenomatous polyps. There is a family history of colon cancer in 2 brothers. She was due for a repeat colonoscopy 3 years ago. She continues to smoke.   GI Review of Systems    Reports acid reflux, belching, bloating, and  nausea.      Denies abdominal pain, chest pain, dysphagia with liquids, dysphagia with solids, heartburn, loss of appetite, vomiting, vomiting blood, weight loss, and  weight gain.      Reports constipation and  irritable bowel syndrome.     Denies anal fissure, black tarry stools, change in bowel habit, diarrhea, diverticulosis, fecal incontinence, heme positive stool, hemorrhoids, jaundice, light color stool, liver problems, rectal bleeding, and  rectal pain. Preventive Screening-Counseling & Management      Drug Use:  no.      Current Medications (verified): 1)  Crestor 10 Mg Tabs (Rosuvastatin  Calcium) .[name].. 1 Tablet By Mouth Every Night 2)  Klor-Con M20 20 Meq Tbcr (Potassium Chloride Crys Cr) .... Take 1 Tablet By Mouth Once A Day 3)  Xanax 0.5 Mg Tabs (Alprazolam) .... One By Mouth Bed As Needed Anxiety 4)  Tylenol Extra Strength 325 Mg  Tabs (Acetaminophen) .... Take One Tablet Every Six Hours For Belly/side Pain 5)  Proair Hfa 108 (90 Base) Mcg/act  Aers (Albuterol Sulfate) .... Two Puffs Every 4-6 Hours As Needed 6)  Carvedilol 12.5 Mg Tabs (Carvedilol) .... Take One Tablet By Mouth Twice A Day 7)  Cardizem Cd 180 Mg Xr24h-Cap (Diltiazem Hcl Coated Beads) .... One By Mouth Daily 8)  Furosemide 40 Mg Tabs (Furosemide) .... One By Mouth Daily 9)  Otc Laxatives .... As Needed 10)  Aspirin 81 Mg Tbec (Aspirin) .... Take 1 Tablet By Mouth Once Daily 11)  Omeprazole 20 Mg Cpdr (Omeprazole) .... Daily 12)  Flovent Hfa 44 Mcg/act Aero (Fluticasone Propionate  Hfa) .... As Directed  Allergies (verified): 1)  ! Ace Inhibitors  Past History:  Past Medical History: Reviewed history from 05/27/2010 and no changes required.  1. Panic attacks 2. Chronic asthma. 3. Hypertrophic cardiomyopathy - followd by Dr. [name] at [facility] 4. Diastolic heart failure, now euvolemic. 5. Normal coronary angiography. 6. Status post dual-mode, dual-pacing, dual-sensing pacemaker for     complete atrioventricular block, gen change 1998, 2004 7. Hypertension. 8. Hyperlipidemia. 9. Continued cigarette use.  10. Hx angioedema due to ACEI  Past Surgical History: Reviewed history from 07/23/2010 and no changes required. Cardiac cath 5/09: normal angiography with LVH c/w hypertrophic  CM. Normal PAW of 6.   Cardiolite 9/00 - neg ischemia - 04/30/2000,  s/p BTL - 04/01/2000,  s/p pacemaker placement -1998   echo 05/30/10: Left ventricle: The cavity size was moderately dilated. Wall     thickness was increased in a pattern of moderate LVH. Systolic     function was normal. The estimated ejection fraction  was in the     range of 55% to 60%.  Family History: CAD, HTN - many family members mother, father, brother, sister, uncles, aunts, paternal grandfather DM- sister, brother Domenic Moras- brother, father MI age 7 brother, 55s father.   Stroke - dad 37, mom 4. Family History of Colon Cancer: 1 Brother Family History of Heart Disease: Father, Mother, Sister  Social History: On disability for heart failure/pacemaker.   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  4 grown children - aged 2, 85.  8 grandchildren.   Drinks Etoh - up to 1/2 pint nightly depending on her stress level.  5-6 cigs/day. lives with daughter [name] Illicit Drug Use - no Drug Use:  no  Review of Systems       The patient complains of allergy/sinus, anxiety-new, arthritis/joint pain, back pain, change in vision, confusion, cough, coughing up blood, depression-new, fatigue, fever, headaches-new, heart rhythm changes, itching, night sweats, shortness of breath, sleeping problems, sore throat, swelling of feet/legs, thirst - excessive, urination - excessive, and urine leakage.  The patient denies blood in urine, breast changes/lumps, fainting, hearing problems, heart murmur, menstrual pain, muscle pains/cramps, nosebleeds, pregnancy symptoms, skin rash, swollen lymph glands, urination changes/pain, vision changes, and voice change.         Pertinent positive and negative review of systems were noted in the above HPI. All other ROS was otherwise negative.   Vital Signs:  Patient profile:   65 year old female Height:      64 inches Weight:      183.38 pounds BMI:     31.59 BSA:     1.89 Pulse rate:   76 / minute Pulse rhythm:   regular BP sitting:   114 / 74  (left arm)  Vitals Entered By: Merriman Deborra Medina) (July 24, 2010 1:45 PM)  Physical Exam  General:  alert, oriented, very pleasant. Coughs intermittently, typical smoker's cough. Mouth:  No deformity or lesions, dentition normal. Chest  Wall:  no bruit, somewhat hyperinflated chest. Lungs:  decreased breath sounds,cough and intermittent high pitched wheezes. Heart:  normal S1, normal S2. Abdomen:  protuberant abdomen with normoactive bowel sounds and tenderness in epigastrium and midabdomen without palpable mass. There is no ascites. Liver edge at costal margin. Rectal:  soft Hemoccult negative stool. Extremities:  no edema. Psych:  Alert and cooperative. Normal mood and affect.   Impression & Recommendations:  Problem # 1:  DYSPHAGIA UNSPECIFIED (ICD-787.20) Patient has intermittent dysphagia as well as dyspepsia. We need to rule out H. pylori gastropathy, peptic ulcer disease or gastritis. Her symptoms are nonspecific and could indicate gallbladder disease. We will proceed with an upper abdominal ultrasound as well as an upper endoscopy and possible dilatation. I have given her samples of Prilosec to continue until we can make a definite diagnosis. I have given her a booklet on gas forming foods and asked her to avoid fried foods. She can take Phazyme for bloating.Her COPD and smoking is contributing to  abdominal bloating and distention.  Orders: ZCOL (ZCOL) ZEGD (ZEGD)  Problem # 2:  COLONIC POLYPS, ADENOMATOUS, HX OF (  ICD-V12.72) Patient is due for a recall colonoscopy which will be scheduled at the time of the upper endoscopy.  Orders: ZCOL (ZCOL) ZEGD (ZEGD)  Other Orders: Ultrasound Abdomen (UAS)  Patient Instructions: 1)  Prilosec 20 mg daily. samples given. 2)  Phazyme 1- 2 after meals for bloating. This may be purchased over the counter. 3)  An abdominal ultrasound has been scheduled at [facility] for 07/26/10 @ 3:00 pm. You will need to arrive at 2:45 pm for registration. Nothing to eat or drink 6 hours before test. 4)  Upper endoscopy with possible dilatation has been scheduled. 5)  Colonoscopy for followup of colon polyps and family history of colon cancer has been scheduled. 6)   Modification of diet to avoid fatty and fried foods. 7)  Copy sent to Dr B. [name], Dr Sarita Haver  8)  The medication list was reviewed and reconciled.  All changed / newly prescribed medications were explained.  A complete medication list was provided to the patient / caregiver. Prescriptions: DULCOLAX 5 MG  TBEC (BISACODYL) Day before procedure take 2 at 3pm and 2 at 8pm.  #4 x 0   Entered by:   Madlyn Frankel CMA (AAMA)   Authorized by:   [name] MD   Signed by:   Madlyn Frankel CMA ([place]) on 07/24/2010   Method used:   Electronically to        [facility] [address] [phone]* (retail)       [address]       [address]       [place], [place]  [address]       Ph: [phone]       Fax: [phone]   RxIDIS:[id number] REGLAN 10 MG  TABS (METOCLOPRAMIDE HCL) As per prep instructions.  #2 x 0   Entered by:   Madlyn Frankel CMA (AAMA)   Authorized by:   [name] MD   Signed by:   Madlyn Frankel CMA ([place]) on 07/24/2010   Method used:   Electronically to        [facility] [address] [phone]* (retail)       [address]       [address]       [place], [place]  [address]       Ph: [phone]       Fax: [phone]   RxID:   [id number] [place]   POWD (POLYETHYLENE GLYCOL 3350) As per prep  instructions.  #255gm x 0   Entered by:   Madlyn Frankel CMA (AAMA)   Authorized by:   [name] MD   Signed by:   Madlyn Frankel CMA ([place]) on 07/24/2010   Method used:   Electronically to        [facility] [address] [phone]* (retail)       [address]       [address]       [place], [place]  [address]       Ph: [phone]       Fax: [phone]   RxID:   [id number]

## 2011-01-14 NOTE — Procedures (Signed)
Summary: Prep for Colonoscopy / [facility]  Prep for Colonoscopy / [facility]   Imported By: Rise Patience 07/26/2010 16:17:07  _____________________________________________________________________  External Attachment:    Type:   Image     Comment:   External Document

## 2011-01-14 NOTE — Miscellaneous (Signed)
Summary: Problem list     Past History:  Past Medical History: Chronic problems: -Panic attacks -Chronic, persistent asthma. Cardiac:  -Hypertrophic cardiomyopathy - followed by Dr. [name] at [facility] on problem list [id number]  -Diastolic heart failure, now euvolemic.  -Normal coronary angiography.  -Status post dual-mode, dual-pacing, dual-sensing pacemaker for     complete atrioventricular block, gen change 1998, 2004  -h/o AF Other CV:  -Hypertension.  -Hyperlipidemia. -Continued cigarette use.  -h/o dyspepsia   Past problems: -h/o angioedema due to ACEI -h/o RLS (Dx 06/2007)

## 2011-01-14 NOTE — Letter (Signed)
Summary: New Patient letter  [facility]  [address] [place], [place] [address]   Phone: [phone]  Fax: [phone]       07/17/2010 MRN: [id number]  [name] [place], [place]  [address]  Dear [name],  Welcome to the Gastroenterology Division at [facility].    You are scheduled to see Dr.  Delfin Edis on 07/24/10  at 2:00 on the 3rd floor at [facility], [place] [employer].  We ask that you try to arrive at our office 15 minutes prior to your appointment time to allow for check-in.  We would like you to complete the enclosed self-administered evaluation form prior to your visit and bring it with you on the day of your appointment.  We will review it with you.  Also, please bring a complete list of all your medications or, if you prefer, bring the medication bottles and we will list them.  Please bring your insurance card so that we may make a copy of it.  If your insurance requires a referral to see a specialist, please bring your referral form from your primary care physician.  Co-payments are due at the time of your visit and may be paid by cash, check or credit card.     Your office visit will consist of a consult with your physician (includes a physical exam), any laboratory testing he/she may order, scheduling of any necessary diagnostic testing (e.g. x-ray, ultrasound, CT-scan), and scheduling of a procedure (e.g. Endoscopy, Colonoscopy) if required.  Please allow enough time on your schedule to allow for any/all of these possibilities.    If you cannot keep your appointment, please call [phone] to cancel or reschedule prior to your appointment date.  This allows [place] the opportunity to schedule an appointment for another patient in need of care.  If you do not cancel or reschedule by 5 p.m. the business day prior to your appointment date, you will be charged a $50.00 late cancellation/no-show fee.    Thank you  for choosing [facility] for your medical needs.  We appreciate the opportunity to care for you.  Please visit [place] at our website  to learn more about our practice.                     Sincerely,                                                             The Gastroenterology Division

## 2011-01-14 NOTE — Assessment & Plan Note (Signed)
Summary: f52m  Medications Added * TYLENOL EXTRA STRENGTH 325 MG  TABS (ACETAMINOPHEN) Take one tablet every six hours for belly/side pain CARVEDILOL 12.5 MG TABS (CARVEDILOL) Take one tablet by mouth twice a day * ASA 81MG  1 tab once daily OMEPRAZOLE 20 MG CPDR (OMEPRAZOLE) daily FLOVENT HFA 44 MCG/ACT AERO (FLUTICASONE PROPIONATE  HFA) as directed      Allergies Added:   Visit Type:  Follow-up Primary Provider:  Eugenie Norrie  MD  CC:  fatigue and bloated feeling.  History of Present Illness: The patient is 65 years old and return for management of her pacemaker and subaortic stenosis. She has a previously diagnosed hypertrophic cardiomyopathy. She had a DDD pacemaker implanted for AV block and had a generator change in 2004. She is pacer dependent. She also has had some evidence of retraction of her leads by chest x-ray although her lead function has always been good.  She was recently hospitalized with congestive heart failure which appears to be related to cutting back on her Lasix because of renal insufficiency. This is now improved and she appears to be doing better. Her last creatinine was 1.6 on July 8.  She is also having some difficulty with swallowing.. She states he gets full drill easily and gets nauseated. She's had no vomiting.  Current Medications (verified): 1)  Crestor 10 Mg Tabs (Rosuvastatin Calcium) .[name].. 1 Tablet By Mouth Every Night 2)  Klor-Con M20 20 Meq Tbcr (Potassium Chloride Crys Cr) .... Take 1 Tablet By Mouth Once A Day 3)  Xanax 0.5 Mg Tabs (Alprazolam) .... One By Mouth Bed As Needed Anxiety 4)  Tylenol Extra Strength 325 Mg  Tabs (Acetaminophen) .... Take One Tablet Every Six Hours For Belly/side Pain 5)  Proair Hfa 108 (90 Base) Mcg/act  Aers (Albuterol Sulfate) .... Two Puffs Every 4-6 Hours As Needed 6)  Metoprolol Tartrate 25 Mg Tabs (Metoprolol Tartrate) .... One By Mouth Two Times A Day 7)  Cardizem Cd 180 Mg Xr24h-Cap (Diltiazem Hcl Coated  Beads) .... One By Mouth Daily 8)  Furosemide 40 Mg Tabs (Furosemide) .... One By Mouth Daily 9)  Centrum  Tabs (Multiple Vitamins-Minerals) .... One By Mouth Daily 10)  Otc Laxatives .... As Needed 11)  Asa 81mg  .... 1 Tab Once Daily 12)  Omeprazole 20 Mg Cpdr (Omeprazole) .... Daily 13)  Flovent Hfa 44 Mcg/act Aero (Fluticasone Propionate  Hfa) .... As Directed  Allergies (verified): 1)  ! Ace Inhibitors  Past History:  Past Medical History: Reviewed history from 05/27/2010 and no changes required.  1. Panic attacks 2. Chronic asthma. 3. Hypertrophic cardiomyopathy - followd by Dr. [name] at [facility] 4. Diastolic heart failure, now euvolemic. 5. Normal coronary angiography. 6. Status post dual-mode, dual-pacing, dual-sensing pacemaker for     complete atrioventricular block, gen change 1998, 2004 7. Hypertension. 8. Hyperlipidemia. 9. Continued cigarette use.  10. Hx angioedema due to ACEI  Review of Systems       ROS is negative except as outlined in HPI.   Vital Signs:  Patient profile:   65 year old female Height:      64 inches Weight:      184 pounds BMI:     31.70 Pulse rate:   77 / minute BP sitting:   118 / 80  (left arm) Cuff size:   large  Vitals Entered By: Lubertha Basque, CNA (July 16, 2010 4:03 PM)  Physical Exam  Additional Exam:  Gen. Well-nourished, in no distress  Neck: No JVD, thyroid not enlarged, no carotid bruits Lungs: No tachypnea, clear without rales, rhonchi or wheezes Cardiovascular: Rhythm regular, PMI not displaced,  heart sounds  normal, no murmurs or gallops, no peripheral edema, pulses normal in all 4 extremities. Abdomen: BS normal, abdomen soft and non-tender without masses or organomegaly, no hepatosplenomegaly. MS: No deformities, no cyanosis or clubbing   Neuro:  No focal sns   Skin:  no lesions    PPM Specifications Following MD:  Vanna Scotland. [name], MD     PPM Vendor:  [employer]     PPM Model Number:  [id number]     PPM Serial  Number:  [id number] PPM DOI:  11/17/2003     PPM Implanting MD:  Vanna Scotland. [name], MD  Lead 1    Location: RA     DOI: 07/08/1985     Model #: [id number]     Serial #: [id number]     Status: active Lead 2    Location: RV     DOI: 07/08/1985     Model #: [id number]     Serial #: [id number]     Status: active   Indications:  Complete Heart Block   PPM Follow Up Remote Check?  No Battery Voltage:  2.69 V     Battery Est. Longevity:  .25 years     Pacer Dependent:  Yes       PPM Device Measurements Atrium  Amplitude: 1.0 mV, Impedance: 466 ohms,  Right Ventricle  Impedance: 533 ohms, Threshold: 1.0 V at 0.5 msec  Episodes MS Episodes:  42525     Percent Mode Switch:  50%     Coumadin:  No Atrial Pacing:  77%     Ventricular Pacing:  100%  Parameters Mode:  DDDR     Lower Rate Limit:  70     Upper Rate Limit:  120 Paced AV Delay:  170     Sensed AV Delay:  150 Tech Comments:  Atrial sensitivity reprogrammed 0.28mV.  Battery near ERI.  ROV 3 months with Dr. [name]. Alma Friendly, LPN  August  2, 624THL 4:13 PM   Impression & Recommendations:  Problem # 1:  DIASTOLIC HEART FAILURE, CHRONIC (ICD-428.32) She has a history of diastolic heart failure and a recent hospitalization for diastolic heart failure and renal insufficiency. She appears better after treatment. Her echocardiogram in the hospital showed LVH but a dilated chamber and no outflow obstruction. Her systolic function was good. This problem now appears stable and her creatinine was 1.6 in followup. Her updated medication list for this problem includes:    Carvedilol 12.5 Mg Tabs (Carvedilol) .[name]... Take one tablet by mouth twice a day    Cardizem Cd 180 Mg Xr24h-cap (Diltiazem hcl coated beads) ..... One by mouth daily    Furosemide 40 Mg Tabs (Furosemide) ..... One by mouth daily  Problem # 2:  HYPERTROPHIC OBSTRUCTIVE CARDIOMYOPATHY (ICD-425.1) She has a history of hypertrophic cardiomyopathy but had no obstruction on recent echo. This is  stable. Her updated medication list for this problem includes:    Carvedilol 12.5 Mg Tabs (Carvedilol) .[name]... Take one tablet by mouth twice a day    Cardizem Cd 180 Mg Xr24h-cap (Diltiazem hcl coated beads) ..... One by mouth daily    Furosemide 40 Mg Tabs (Furosemide) ..... One by mouth daily  Problem # 3:  PACEMAKER (ICD-V45.[name]01) She has a DDD pacemaker for AV block. She is patient dependent. She also has tension in her leads  and she may need to have a pocket revision when she has her generator replaced. We will see her in 3 months. Her lead parameters were good.  Problem # 4:  ATRIAL FIBRILLATION (ICD-427.31) She had mode switches about 50% of the time on her pacemaker interrogation. I do not see any record that she has had clinically documented atrial fibrillation in the past. We will need to address this. She is probably Mali score 2 with hypertension and CHF. She is probably Mali Vasc score 4 with the addition of age and female sex. I'll try to address this on her followup visit after we get some of her other problems resolved. Her updated medication list for this problem includes:    Carvedilol 12.5 Mg Tabs (Carvedilol) .[name]... Take one tablet by mouth twice a day  Problem # 5:  NAUSEA (ICD-787.02) She has nausea and early satiety.  Will plan GI referral. Orders: Gastroenterology Referral (GI)  Patient Instructions: 1)  Your physician recommends that you schedule a follow-up appointment in: 3 months. 2)  Stop metoprolol. 3)  Start Carvedilol 12.5mg  two times a day. 4)  We will refer you to the GI department. Prescriptions: CARVEDILOL 12.5 MG TABS (CARVEDILOL) Take one tablet by mouth twice a day  #60 x 6   Entered by:   Alvis Lemmings, RN, BSN   Authorized by:   Fatima Sanger, MD, [facility]   Signed by:   Alvis Lemmings, RN, BSN on 07/16/2010   Method used:   Electronically to        [facility] [address] [phone]* (retail)       [address]       [address]        [place], [place]  [address]       Ph: [phone]       Fax: [phone]   RxID:   [id number]

## 2011-01-14 NOTE — Progress Notes (Signed)
Summary: RX   Phone Note Refill Request Call back at Home Phone [phone]   Refills Requested: Medication #1:  XANAX 0.5 MG TABS one by mouth bed as needed anxiety pt goes to [facility]  Initial call taken by: Samara Snide,  May 02, 2010 3:16 PM  Follow-up for Phone Call        Pt will need to see Dr. Sarita Haver in the office prior to any refills on xanax. Follow-up by: Dion Body  MD,  May 02, 2010 6:49 PM  Additional Follow-up for Phone Call Additional follow up Details #1::        pt has an appt tuesday Additional Follow-up by: Audelia Hives CMA,  May 03, 2010 11:50 AM

## 2011-01-14 NOTE — Assessment & Plan Note (Signed)
Summary: bp/eo   Vital Signs:  Patient profile:   65 year old female Height:      64 inches Weight:      177.4 pounds BMI:     30.56 Temp:     98.4 degrees F oral Pulse rate:   91 / minute BP sitting:   106 / 74  (left arm) Cuff size:   regular  Vitals Entered By: Levert Feinstein LPN (May 24, 624THL 579FGE PM) CC: f/u meds Is Patient Diabetic? No Pain Assessment Patient in pain? no        Primary Care Provider:  Eugenie Norrie  MD  CC:  f/u meds.  History of Present Illness: 1. ? yeast infection reports vaginal itch for the past 7 days. Took some OTC yeast medicine. Denies any discharge. Reports that she has been using a battery-powered vibrator "down there" and wonders if this could be part of the reason she's having these symptoms.    2. Hypertension adherent to medications: yes side effects from medications:no subjective: no problems with her high BP  ROS: chest pain:  no     SOB: no   HA: no     swelling: no  vision changes: no      3. anxiety Reports that she often feels jittery and sometimes has heart fluttering. This lasts about 15-20 minutes and generally resolves on its own. Xanax typically helps. States that she would like more of this. Took a dose today but hasn't had any in over a week because we haven't refilled it.   4. elevated a1c -- comment only.  Had elevated a1c of 6.6 5/10. We have not yet addressed possible diabetes.   Habits & Providers  Alcohol-Tobacco-Diet     Tobacco Status: current     Tobacco Counseling: to quit use of tobacco products     Cigarette Packs/Day: 0.5  Current Medications (verified): 1)  Crestor 10 Mg Tabs (Rosuvastatin Calcium) .[name].. 1 Tablet By Mouth Every Night 2)  Klor-Con M20 20 Meq Tbcr (Potassium Chloride Crys Cr) .... Take 1 Tablet By Mouth Once A Day 3)  Xanax 0.5 Mg Tabs (Alprazolam) .... One By Mouth Bed As Needed Anxiety 4)  Tylenol Extra Strength 500 Mg  Tabs (Acetaminophen) .... Take One Tablet Every Six Hours For  Belly/side Pain 5)  Proair Hfa 108 (90 Base) Mcg/act  Aers (Albuterol Sulfate) .... Two Puffs Every 4-6 Hours As Needed 6)  Lopressor 50 Mg  Tabs (Metoprolol Tartrate) .... One By Mouth Bid 7)  Cardizem Cd 180 Mg Xr24h-Cap (Diltiazem Hcl Coated Beads) .... One By Mouth Daily 8)  Furosemide 40 Mg Tabs (Furosemide) .... One By Mouth Daily 9)  Centrum  Tabs (Multiple Vitamins-Minerals) .... One By Mouth Daily 10)  Fish Oil  Oil (Fish Oil) .... Daily 11)  Ventolin Hfa 108 (90 Base) Mcg/act Aers (Albuterol Sulfate) .... 2 Puffs Q 4 Hoiurs 12)  Co Q-10 30 Mg  Caps (Coenzyme Q10) .... Daily 13)  Otc Laxatives .... As Needed 14)  Asa(Nsaid) .[name].. 1 Tab Once Daily 15)  Coenzyme Q10 100 Mg Caps (Coenzyme Q10) .[name].. 1 Tab As Needed 16)  Fluconazole 150 Mg Tabs (Fluconazole) .... One By Mouth X 1 Then Repeat in 72 Hours  Allergies (verified): 1)  ! Ace Inhibitors  Review of Systems       review of systems as noted in HPI section   Physical Exam  General:  vital signs reviewed and normal Alert, appropriate; well-dressed and well-nourished  Lungs:  work of breathing unlabored, clear to auscultation bilaterally; no wheezes, rales, or ronchi; good air movement throughout  Heart:  regular rate and rhythm, no murmurs; normal s1/s2  Psych:  alert and oriented. full affect, normally interactive. Good eye contact.    Impression & Recommendations:  Problem # 1:  VAGINAL PRURITUS (ICD-698.1)  will treat empirically for yeast infection. Advised that foreign body for self-stimulation could cause this. Pt has stopped using it. Exam deferred today. Pt to call if symptoms do not improve in the next 2-3 days.  Orders: [place]- Est  Level 4 [id number])  Problem # 2:  HYPERTENSION, BENIGN SYSTEMIC (ICD-401.1)  at goal. continue to follow on current regimen.  Her updated medication list for this problem includes:    Lopressor 50 Mg Tabs (Metoprolol tartrate) ..... One by mouth bid    Cardizem Cd 180 Mg Xr24h-cap  (Diltiazem hcl coated beads) ..... One by mouth daily    Furosemide 40 Mg Tabs (Furosemide) ..... One by mouth daily  Orders: [place]- Est  Level 4 ([address]214)  Problem # 3:  PANIC ATTACKS (ICD-300.01) Assessment: Unchanged  feeling more jittery now. continue xanax as needed. Consider imipramine to help taper but pt is not interested in this and could have some cardaic side effects.   The following medications were removed from the medication list:    Imipramine Hcl 50 Mg Tabs (Imipramine hcl) ..... One by mouth daily for jitteriness Her updated medication list for this problem includes:    Xanax 0.5 Mg Tabs (Alprazolam) ..... One by mouth bed as needed anxiety  Orders: [place]- Est  Level 4 [id number])  Problem # 4:  HYPERGLYCEMIA (ICD-790.29) Assessment: Comment Only a1c in prediabetic range. Will not give diagnosis of diabetes at this time but continue to monitor.   Orders: Basic Met-[facility] [id number] A1C-[facility] [id number])  Complete Medication List: 1)  Crestor 10 Mg Tabs (Rosuvastatin calcium) .[name].. 1 tablet by mouth every night 2)  Klor-con M20 20 Meq Tbcr (Potassium chloride crys cr) .... Take 1 tablet by mouth once a day 3)  Xanax 0.5 Mg Tabs (Alprazolam) .... One by mouth bed as needed anxiety 4)  Tylenol Extra Strength 500 Mg Tabs (Acetaminophen) .... Take one tablet every six hours for belly/side pain 5)  Proair Hfa 108 (90 Base) Mcg/act Aers (Albuterol sulfate) .... Two puffs every 4-6 hours as needed 6)  Lopressor 50 Mg Tabs (Metoprolol tartrate) .... One by mouth bid 7)  Cardizem Cd 180 Mg Xr24h-cap (Diltiazem hcl coated beads) .... One by mouth daily 8)  Furosemide 40 Mg Tabs (Furosemide) .... One by mouth daily 9)  Centrum Tabs (Multiple vitamins-minerals) .... One by mouth daily 10)  Fish Oil Oil (Fish oil) .... Daily 11)  Ventolin Hfa 108 (90 Base) Mcg/act Aers (Albuterol sulfate) .... 2 puffs q 4 hoiurs 12)  Co Q-10 30 Mg Caps (Coenzyme q10) .... Daily 13)  Otc Laxatives  .... As  needed 14)  Asa(nsaid)  .[name].. 1 tab once daily 15)  Coenzyme Q10 100 Mg Caps (Coenzyme q10) .[name].. 1 tab as needed 16)  Fluconazole 150 Mg Tabs (Fluconazole) .... One by mouth x 1 then repeat in 72 hours  Patient Instructions: 1)  take the xanax only as needed -- remember that this isn't a good long-term medicine.  2)  Take the fluconazole for the yeast infection. Call if your net better in the next 2-3 days. Prescriptions: XANAX 0.5 MG TABS (ALPRAZOLAM) one by mouth bed as needed anxiety  #  30 x 0   Entered and Authorized by:   Eugenie Norrie  MD   Signed by:   Eugenie Norrie  MD on 05/07/2010   Method used:   Print then Give to Patient   RxID:   [id number] IMIPRAMINE HCL 50 MG TABS (IMIPRAMINE HCL) one by mouth daily for jitteriness  #30 x 0   Entered and Authorized by:   Eugenie Norrie  MD   Signed by:   Eugenie Norrie  MD on 05/07/2010   Method used:   Print then Give to Patient   RxID:   [id number] Duanne Moron 0.5 MG TABS (ALPRAZOLAM) one by mouth bed as needed anxiety  #15 x 0   Entered and Authorized by:   Eugenie Norrie  MD   Signed by:   Eugenie Norrie  MD on 05/07/2010   Method used:   Print then Give to Patient   RxID:   [id number] FLUCONAZOLE 150 MG TABS (FLUCONAZOLE) one by mouth x 1 then repeat in 72 hours  #2 x 0   Entered and Authorized by:   Eugenie Norrie  MD   Signed by:   Eugenie Norrie  MD on 05/07/2010   Method used:   Electronically to        [facility] [address] [phone]* (retail)       [address]       [address]       [place], [place]  [address]       Ph: [phone]       Fax: [phone]   [id number]   Laboratory Results   Blood Tests   Date/Time Received: May 07, 2010 3:23 PM  Date/Time Reported: May 07, 2010 3:54 PM   HGBA1C: 6.1%   (Normal Range: Non-Diabetic - 3-6%   Control Diabetic - 6-8%)  Comments: ...............test performed by......Marland KitchenBonnie A. [name], MLS (ASCP)cm       Prevention & Chronic  Care Immunizations   Influenza vaccine: Not documented   Influenza vaccine due: Refused  (12/12/2008)    Tetanus booster: 03/15/2004: Done.   Tetanus booster due: 03/15/2014    Pneumococcal vaccine: Not documented   Pneumococcal vaccine deferral: Refused  (02/08/2010)    H. zoster vaccine: Not documented  Colorectal Screening   Hemoccult: Done.  (10/15/1996)   Hemoccult due: Not Indicated    Colonoscopy: Done.  (04/14/2004)   Colonoscopy due: 04/14/2014  Other Screening   Pap smear: NEGATIVE FOR INTRAEPITHELIAL LESIONS OR MALIGNANCY.  (04/25/2009)   Pap smear due: Not Indicated    Mammogram: ASSESSMENT: Negative - BI-RADS 1^MM DIGITAL SCREENING  (04/23/2010)   Mammogram due: 04/2010    DXA bone density scan: Done.  (04/14/2004)   DXA scan due: None    Smoking status: current  (05/07/2010)   Smoking cessation counseling: YES  (03/26/2009)  Lipids   Total Cholesterol: Not documented   LDL: 85  (12/12/2008)   LDL Direct: 79  (01/11/2010)   HDL: Not documented   Triglycerides: Not documented    SGOT (AST): 19  (01/11/2010)   SGPT (ALT): 9  (01/11/2010)   Alkaline phosphatase: 49  (01/11/2010)   Total bilirubin: 0.3  (01/11/2010)  Hypertension   Last Blood Pressure: 106 / 74  (05/07/2010)   Serum creatinine: 1.31  (01/11/2010)   Serum potassium 4.3  (01/11/2010)    Hypertension flowsheet reviewed?: Yes   Progress toward BP goal: At goal  Self-Management Support :   Personal Goals (by the next clinic visit) :  Personal blood pressure goal: 140/90  (01/11/2010)     Personal LDL goal: 100  (01/11/2010)    Hypertension self-management support: Written self-care plan  (01/11/2010)    Hypertension self-management support not done because: Good outcomes  (01/11/2010)    Lipid self-management support: Written self-care plan  (01/11/2010)     Lipid self-management support not done because: Good outcomes  (01/11/2010)

## 2011-01-14 NOTE — Cardiovascular Report (Signed)
Summary: Office Visit   Office Visit   Imported By: Sallee Provencal 01/24/2010 14:47:38  _____________________________________________________________________  External Attachment:    Type:   Image     Comment:   External Document

## 2011-01-14 NOTE — Procedures (Signed)
Summary: Upper Endoscopy  Patient: [name] Note: All result statuses are Final unless otherwise noted.  Tests: (1) Upper Endoscopy (EGD)   EGD Upper Endoscopy       DONE     [facility]     [place], [place]  [address]           ENDOSCOPY PROCEDURE REPORT           PATIENT:  [name], [name]  MR#:  [id number]     BIRTHDATE:  August 27, 1946, 64 yrs. old  GENDER:  female           ENDOSCOPIST:  Lowella Bandy. [name], MD     Referred by:  Vanna Scotland [name], M.D.           PROCEDURE DATE:  08/13/2010     PROCEDURE:  EGD with biopsy     ASA CLASS:  Class III     INDICATIONS:  dysphagia, bloating dyspepsia, normal upper abd.     ultrasound           MEDICATIONS:   Versed 5 mg, Fentanyl 75 mcg     TOPICAL ANESTHETIC:  Cetacaine Spray           DESCRIPTION OF PROCEDURE:   After the risks benefits and     alternatives of the procedure were thoroughly explained, informed     consent was obtained.  The  endoscope was introduced through the     mouth and advanced to the second portion of the duodenum, without     limitations.  The instrument was slowly withdrawn as the mucosa     was fully examined.     <<PROCEDUREIMAGES>>           The upper, middle, and distal third of the esophagus were     carefully inspected and no abnormalities were noted. The z-line     was well seen at the GEJ. The endoscope was pushed into the fundus     which was normal including a retroflexed view. The antrum,gastric     body, first and second part of the duodenum were unremarkable (see     image5, image6, and image7). spasm of the UES, also angulated     distal esophageal lumen, no definite stricture maloney dilator 21F     Maloney passed  The stomach was entered and closely examined. The     antrum, angularis, and lesser curvature were well visualized,     including a retroflexed view of the cardia and fundus. The stomach     wall was normally distensable. The scope  passed easily through the     pylorus into the duodenum. With standard forceps, a biopsy was     obtained and sent to pathology (see image4 and image3).  The     duodenal bulb was normal in appearance, as was the postbulbar     duodenum (see image2 and image1).    Retroflexed views revealed no     abnormalities.    The scope was then withdrawn from the patient     and the procedure completed.           COMPLICATIONS:  None           ENDOSCOPIC IMPRESSION:     1) Normal EGD     2) Normal stomach     3) Normal duodenum     s/p passage of 21F Malloney dolator for UES spasm  s/p gastric biopsies to r/o H.Pylori     RECOMMENDATIONS:     1) Await biopsy results     continue Prelosec 20 mg po bid     colonoscopy           REPEAT EXAM:  In 0 year(s) for.           ______________________________     Lowella Bandy. [name], MD           CC:  Eugenie Norrie MD           n.     Lorrin MaisLowella Bandy. Brodie at 08/13/2010 09:11 AM           [name], [name], [id number]  Note: An exclamation mark (!) indicates a result that was not dispersed into the flowsheet. Document Creation Date: 08/13/2010 9:13 AM _______________________________________________________________________  (1) Order result status: Final Collection or observation date-time: 08/13/2010 09:04 Requested date-time:  Receipt date-time:  Reported date-time:  Referring Physician:   Ordering Physician: Delfin Edis [id number]) Specimen Source:  Source: [name] Order Number: [id number] Lab site:

## 2011-01-14 NOTE — Procedures (Signed)
Summary: Colonoscopy  Patient: [name] Note: All result statuses are Final unless otherwise noted.  Tests: (1) Colonoscopy (COL)   COL Colonoscopy           [facility]     [place], [place]  [address]           COLONOSCOPY PROCEDURE REPORT           PATIENT:  [name], [name]  MR#:  [id number]     BIRTHDATE:  Oct 31, 1946, 64 yrs. old  GENDER:  female     ENDOSCOPIST:  Lowella Bandy. [name], MD     REF. BY:  Vanna Scotland [name], M.D.     PROCEDURE DATE:  08/13/2010     PROCEDURE:  Colonoscopy [id number]     ASA CLASS:  Class III     INDICATIONS:  Elevated Risk Screening f hx colon cancer 2 brothers           personal hx of adenomatous polyps x2 in 2005     MEDICATIONS:   Versed 3 mg, Fentanyl 25 mcg           DESCRIPTION OF PROCEDURE:   After the risks benefits and     alternatives of the procedure were thoroughly explained, informed     consent was obtained.  Digital rectal exam was performed and     revealed no rectal masses.   The  endoscope was introduced through     the anus and advanced to the cecum, which was identified by both     the appendix and ileocecal valve, without limitations.  The     quality of the prep was good, using MiraLax.  The instrument was     then slowly withdrawn as the colon was fully examined.     <<PROCEDUREIMAGES>>           FINDINGS:  Four polyps were found throughout the colon. 3-5 mm     sessile polyps in the sigmoid and right colon removed Polyps were     snared without cautery. Retrieval was successful. snare polyp The     polyps were removed using cold biopsy forceps (see image1, image6,     image7, and image8).  This was otherwise a normal examination of     the colon (see image7, image3, image2, image4, and image5).     Retroflexed views in the rectum revealed no abnormalities.    The     scope was then withdrawn from the patient and the procedure     completed.           COMPLICATIONS:  None     ENDOSCOPIC IMPRESSION:     1) Four polyps throughout the colon     2) Otherwise normal examination     s/p multiple polypectomies     RECOMMENDATIONS:     1) Await pathology results     suspect IBS causing synmptoms of bloating     REPEAT EXAM:  In 5 year(s) for.           ______________________________     Lowella Bandy. [name], MD           CC:           n.     eSIGNED:   Lowella Bandy. Quron Ruddy at 08/13/2010 09:47 AM           Rudean Curt, [id number]  Note: An exclamation mark (!) indicates a  result that was not dispersed into the flowsheet. Document Creation Date: 08/13/2010 9:49 AM _______________________________________________________________________  (1) Order result status: Final Collection or observation date-time: 08/13/2010 09:40 Requested date-time:  Receipt date-time:  Reported date-time:  Referring Physician:   Ordering Physician: Delfin Edis [phone]) Specimen Source:  Source: Tawanna Cooler Order Number: [id number] Lab site:

## 2011-01-14 NOTE — Assessment & Plan Note (Signed)
Summary: per check out  Medications Added AMIODARONE HCL 400 MG TABS (AMIODARONE HCL) take one tablet by mouth once daily for one more week, then decrease to 1/2 tablet by mouth once daily.      Allergies Added:   Visit Type:  Follow-up Referring Provider:  Dr Eustace Quail Primary Provider:  Dr Sarita Haver   History of Present Illness: The patient is 65 years old and retired from [employer] of hypertrophic cardiomyopathy and pacemaker and recent onset atrial fibrillation. She has a DDD pacemaker implanted for AV block and had a generator change in 2004. She is pacer dependent. She also has hypertrophic cardiomyopathy. She has a history of diastolic heart failure and history of normal cornea conjunctivae. Her pacemaker leads have been somewhat tight due to some migration of the pacemaker generator in the pocket.  Last visit she was found to be in atrial fibrillation. She  had some increased symptoms of fatigue over the past several months and decreased energy levels.  Since her last visit she has done well. She has had no chest pain or palpitations. Her hemoglobin fell from 11.9-10.9 and we got iron studies which were normal and B12 and folate which were normal. She also got a stool guaiac which is pending.  Or other problems include hypertension, hyperlipidemia, and cigarette use  Current Medications (verified): 1)  Crestor 10 Mg Tabs (Rosuvastatin Calcium) .[name].. 1 Tablet By Mouth Every Night 2)  Klor-Con M20 20 Meq Tbcr (Potassium Chloride Crys Cr) .... Take 1 Tablet By Mouth Once A Day 3)  Xanax 0.5 Mg Tabs (Alprazolam) .... One By Mouth Bed As Needed Anxiety 4)  Tylenol Extra Strength 325 Mg  Tabs (Acetaminophen) .... Take One Tablet Every Six Hours For Belly/side Pain 5)  Proair Hfa 108 (90 Base) Mcg/act  Aers (Albuterol Sulfate) .... Two Puffs Every 4-6 Hours As Needed 6)  Carvedilol 12.5 Mg Tabs (Carvedilol) .... Take One Tablet By Mouth Twice A Day 7)  Cardizem Cd 180 Mg Xr24h-Cap  (Diltiazem Hcl Coated Beads) .... One By Mouth Daily 8)  Furosemide 40 Mg Tabs (Furosemide) .... One By Mouth Daily 9)  Otc Laxatives .... As Needed 10)  Aspirin 81 Mg Tbec (Aspirin) .... Take 1 Tablet By Mouth Once Daily 11)  Omeprazole 20 Mg Cpdr (Omeprazole) .... Daily 12)  Flovent Hfa 44 Mcg/act Aero (Fluticasone Propionate  Hfa) .... As Directed 13)  Pradaxa 150 Mg Caps (Dabigatran Etexilate Mesylate) .... Take One Tab By Mouth Two Times A Day 14)  Amiodarone Hcl 400 Mg Tabs (Amiodarone Hcl) .... Take One Tablet By Mouth Twice A Dayfor 1 Week, Then Take One Tablet By Mouth Daily  Allergies (verified): 1)  ! Ace Inhibitors  Past History:  Past Medical History: Reviewed history from 05/27/2010 and no changes required.  1. Panic attacks 2. Chronic asthma. 3. Hypertrophic cardiomyopathy - [name] 4. Diastolic heart failure, now euvolemic. 5. Normal coronary angiography. 6. Status post dual-mode, dual-pacing, dual-sensing pacemaker for     complete atrioventricular block, gen change 1998, 2004 7. Hypertension. 8. Hyperlipidemia. 9. Continued cigarette use.  10. Hx angioedema due to ACEI  Review of Systems       ROS is negative except as outlined in HPI.   Vital Signs:  Patient profile:   65 year old female Height:      65 inches Weight:      179 pounds BMI:     29.89 Pulse rate:   76 / minute BP  sitting:   120 / 70  (left arm)  Vitals Entered By: Margaretmary Bayley CMA (November 04, 2010 2:55 PM)  Physical Exam  Additional Exam:  Gen. Well-nourished, in no distress   Neck: No JVD, thyroid not enlarged, no carotid bruits Lungs: No tachypnea, clear without rales, rhonchi or wheezes Cardiovascular: Rhythm regular, PMI not displaced,  heart sounds  normal, grade 2/6 systolic murmur, no peripheral edema, pulses normal in all 4 extremities. Abdomen: BS normal, abdomen soft and non-tender without masses or organomegaly, no hepatosplenomegaly. MS: No  deformities, no cyanosis or clubbing   Neuro:  No focal sns   Skin:  no lesions    PPM Specifications Following MD:  Vanna Scotland. [name], MD     PPM Vendor:  [employer]     PPM Model Number:  [id number]     PPM Serial Number:  [id number] PPM DOI:  11/17/2003     PPM Implanting MD:  Vanna Scotland. [name], MD  Lead 1    Location: RA     DOI: 07/08/1985     Model #: [id number]     Serial #: [id number]     Status: active Lead 2    Location: RV     DOI: 07/08/1985     Model #: [id number]     Serial #: [id number]     Status: active   Indications:  Complete Heart Block   PPM Follow Up Battery Voltage:  2.66 V     Battery Est. Longevity:  0.25-0.75 yrs     Pacer Dependent:  Yes      Episodes MS Episodes:  532     Percent Mode Switch:  >99%     Coumadin:  No Atrial Pacing:  66%     Ventricular Pacing:  >99%  Parameters Mode:  DDDR     Lower Rate Limit:  70     Upper Rate Limit:  120 Paced AV Delay:  170     Sensed AV Delay:  150 Next Cardiology Appt Due:  11/18/2010 Tech Comments:  INTERROGATION ONLY---PT IN AF 99% OF TIME.  PT TO BE SET UP FOR CARDIOVERSION.  BATTERY LONGEVITY 0.25-0.75 YRS--ROV IN 1 MTH FOR DEVICE CHECK. Shelly Bombard  November 04, 2010 5:23 PM  Impression & Recommendations:  Problem # 1:  ATRIAL FIBRILLATION (ICD-427.31) She is still in atrial fibrillation today. We interrogated her pacemaker to document this. We will plan for DC cardioversion next week. We will continue amiodarone 400 mg a day for one more week and then cut back to 200 mg a day.  She Has a Pacemaker in since She Is Pacer Dependent We Will Ask the [place] Representative to Help [place]. We'll Adjust Her Output up before the Cardioversion and Will Be Available to Turn Her Pacemaker down to Be Sure She Has Converted Following the Cardioversion. Her updated medication list for this problem includes:    Carvedilol 12.5 Mg Tabs (Carvedilol) .[name]... Take one tablet by mouth twice a day    Aspirin 81 Mg Tbec (Aspirin) .[name]... Take 1 tablet by mouth once  daily    Amiodarone Hcl 400 Mg Tabs (Amiodarone hcl) .[name]... Take one tablet by mouth once daily for one more week, then decrease to 1/2 tablet by mouth once daily.  Orders: EKG w/ Interpretation (93000) Cardioversion (Cardioversion)  Problem # 2:  HYPERTROPHIC OBSTRUCTIVE CARDIOMYOPATHY (ICD-425.1) She has a hypertrophic cardiomyopathy. This makes it more important to get her back in sinus rhythm. Otherwise this problem is stable.  Her updated medication list for this problem includes:    Carvedilol 12.5 Mg Tabs (Carvedilol) .[name]... Take one tablet by mouth twice a day    Cardizem Cd 180 Mg Xr24h-cap (Diltiazem hcl coated beads) ..... One by mouth daily    Furosemide 40 Mg Tabs (Furosemide) ..... One by mouth daily    Aspirin 81 Mg Tbec (Aspirin) .[name]... Take 1 tablet by mouth once daily    Amiodarone Hcl 400 Mg Tabs (Amiodarone hcl) .[name]... Take one tablet by mouth once daily for one more week, then decrease to 1/2 tablet by mouth once daily.  Problem # 3:  PACEMAKER, PERMANENT (ICD-V45.01) Her pacemaker is nearing ERI. We will check it again in a month. She is pacer dependent. She has a generator change the pacemaker will need to be repositioned upwards since it has migrated [place] words. There is some tension on the leads and hopefully they can be adjusted easily.  Patient Instructions: 1)  You have been scheduled for a cardioversion on Tues. 11/12/10 with Dr. [name]. Please refer to the instruction sheet given to you today. 2)  You will need to see Nevin Bloodgood or Cristin in the device clinic in 1 month for a device check. 3)  Your physician recommends that you schedule a follow-up appointment in: 3 weeks. 4)  Continue amiodarone 400mg  once daily for one week, then decrease to 200mg  once daily (start 200mg  once daily on 11/12/10). Prescriptions: PROAIR HFA 108 (90 BASE) MCG/ACT  AERS (ALBUTEROL SULFATE) two puffs every 4-6 hours as needed  #1 x 2   Entered by:   Alvis Lemmings, RN, BSN   Authorized  by:   Fatima Sanger, MD, [facility]   Signed by:   Alvis Lemmings, RN, BSN on 11/04/2010   Method used:   Electronically to        [facility] [address] [phone]* (retail)       [address]       [address]       [place], [place]  [address]       Ph: [phone]       Fax: [phone]   [place]:   [phone]

## 2011-01-14 NOTE — Miscellaneous (Signed)
Summary: Editing asthma Dx  Clinical Lists Changes  Problems: Removed problem of ASTHMA, UNSPECIFIED, UNSPECIFIED STATUS (ICD-493.90) Added new problem of ASTHMA, PERSISTENT, MILD (ICD-493.90)

## 2011-01-14 NOTE — Miscellaneous (Signed)
Summary: cxr 06.15.2011  Clinical Lists Changes  Observations: Added new observation of CXR RESULTS:    IMPRESSION:    1.  Near total resolution of diffuse interstitial edema.   2.  Question tiny residual pleural effusions on the lateral view.   3.  Stable cardiomegaly.  (05/29/2010 14:52)      CXR  Procedure date:  05/29/2010  Findings:         IMPRESSION:    1.  Near total resolution of diffuse interstitial edema.   2.  Question tiny residual pleural effusions on the lateral view.   3.  Stable cardiomegaly.

## 2011-01-14 NOTE — Assessment & Plan Note (Signed)
Summary: Gastroenterology  [place]   MR#:  [id number] Page #  NAME:  [name], [name]    OFFICE NO:  [id number]  DATE:  04/03/04  DOB:  [birth year]  The patient is a very nice 65 year old African American female with history of dual-chamber pacemaker inserted in 1986 for complete atrioventricular block.  She also has a history of hypertension and history of smoking.  She is here today to schedule colonoscopy because of a family history of colon cancer.  Two of her brothers have recently been diagnosed with colon cancer.  Both of them underwent resection.  They live up in the [place] where the treatment has been taking place.  The patient herself has no symptoms.  She has regular bowel habits, no abdominal pain, and no rectal bleeding.    PAST MEDICAL HISTORY:  Significant for high blood pressure, third-degree heart block, anxiety, panic disorder, depression, allergies.  OPERATIONS:  Hysterectomy, tubal ligation, and pacemaker placement in 1986.  FAMILY HISTORY:  Positive for heart disease and diabetes.  SOCIAL HISTORY:  She is married, with her husband.  She is on disability.  She used to smoke 1 pack a day, now 1 pack every 2 days.  She drinks alcohol occasionally.  REVIEW OF SYSTEMS:  Positive for frequent cough, joint pains, sleeping problems, night sweats, vision changes, itching, muscle pains, confusion.  PHYSICAL EXAM:  Blood pressure 150/100, pulse 72, and weight 180 pounds.  The patient was not examined today.  IMPRESSION:  A 65 year old [place] American female with strong family history of colon cancer in both brothers.  She is at high risk for colon cancer and colon polyps and for that reason is a good candidate for screening colonoscopy.  PLAN:  Colonoscopy scheduled using a routine colonoscopy prep for May 01, 2004.  She was asked to follow the routine colonoscopy prep and discontinue her aspirin 5 days prior to the procedure.  The patient will undergo complete monitoring of her heart  and all her vital signs during the procedure.     Lowella Bandy. [name], M.D. [id number] cc:  Dr. Eustace Quail        Dr. Tsosie Billing D:  04/03/04; T:  ; Job [phone]

## 2011-01-14 NOTE — Miscellaneous (Signed)
Summary: Update problem list   Clinical Lists Changes  Problems: Removed problem of DIASTOLIC HEART FAILURE, CHRONIC (ICD-428.32) Added new problem of CHRONIC DIASTOLIC HEART FAILURE (0000000)

## 2011-01-14 NOTE — Assessment & Plan Note (Signed)
Summary: fu meds/kh   Vital Signs:  Patient profile:   65 year old female Height:      64 inches Weight:      183 pounds BMI:     31.53 BSA:     1.89 Temp:     97.9 degrees F Pulse rate:   99 / minute BP sitting:   104 / 73  Vitals Entered By: Christen Bame CMA (January 11, 2010 2:21 PM) CC: routine visit Is Patient Diabetic? No Pain Assessment Patient in pain? no        Primary Care Provider:  Eugenie Norrie  MD  CC:  routine visit.  History of Present Illness: 1. L face soreness Started 2 days ago. Better now but still just a little sore. No swelling. No trauma or other inciting event. Wonders if she had a mini-stroke.  2. LLQ pain  Has been going on for about 1 months. Noticed a tiny bit of blood about 1 week ago in one stool Had normal colonoscopy in 2005.   3. HTN No problems taking cardizem and metoprolol.   4. Anxiety Is taking two xanax's on many days. States that her stress increased. Sometimes feels depressed. Would like to be dating and thinks this causes her to be stressed.  5. hypertrophic CM Has follow-up with Dr. [name] 01/23/10. No chest pain or SOB.   Habits & Providers  Alcohol-Tobacco-Diet     Tobacco Status: current     Tobacco Counseling: to quit use of tobacco products     Cigarette Packs/Day: 0.25  Current Medications (verified): 1)  Crestor 10 Mg Tabs (Rosuvastatin Calcium) .[name].. 1 Tablet By Mouth Every Night 2)  Klor-Con M20 20 Meq Tbcr (Potassium Chloride Crys Cr) .... Take 1 Tablet By Mouth Once A Day 3)  Xanax 0.5 Mg Tabs (Alprazolam) .... One By Mouth Bed As Needed Anxiety 4)  Tylenol Extra Strength 500 Mg  Tabs (Acetaminophen) .... Take One Tablet Every Six Hours For Belly/side Pain 5)  Proair Hfa 108 (90 Base) Mcg/act  Aers (Albuterol Sulfate) .... Two Puffs Every 4-6 Hours As Needed 6)  Aspirin Adult Low Strength 81 Mg  Tbec (Aspirin) .... One By Mouth Daily 7)  Lopressor 50 Mg  Tabs (Metoprolol Tartrate) .... One By Mouth  Bid 8)  Cardizem Cd 180 Mg Xr24h-Cap (Diltiazem Hcl Coated Beads) .... One By Mouth Daily 9)  Furosemide 40 Mg Tabs (Furosemide) .... One By Mouth Daily 10)  Centrum  Tabs (Multiple Vitamins-Minerals) .... One By Mouth Daily 11)  Fish Oil  Oil (Fish Oil) .... Daily 12)  Ventolin Hfa 108 (90 Base) Mcg/act Aers (Albuterol Sulfate) .... 2 Puffs Q 4 Hoiurs 13)  Co Q-10 30 Mg  Caps (Coenzyme Q10) .... Daily 14)  Vitamin C 500 Mg  Tabs (Ascorbic Acid) 15)  Otc Laxatives .... As Needed  Allergies (verified): 1)  ! Ace Inhibitors  Social History: Packs/Day:  0.25  Review of Systems  The patient denies anorexia, fever, chest pain, and dyspnea on exertion.    Physical Exam  Additional Exam:  General:  Vital signs reviewed -- overweight but otherwise normal Alert, appropriate; well-dressed and well-nourished Lungs:  work of breathing unlabored, clear to auscultation bilaterally; no wheezes, rales, or ronchi; good air movement throughout Heart:  regular rate and rhythm, no murmurs; normal s1/s2 Abd: +BS, soft, non-tender, non-distended; no masses; no rebound or guarding  Pulses:  DP and radial pulses 2+ bilaterally  Extremities:  no cyanosis, clubbing, or edema Neurologic:  alert and oriented. speech normal. CN II-XII intact. Normal strength. No facial droop.     Impression & Recommendations:  Problem # 1:  DISTURBANCE OF SKIN SENSATION (ICD-782.0)  advised that exam and history are not consistent with TIA. Continue ASA.   Orders: [place]- Est  Level 4 ([address]214)  Problem # 2:  ABDOMINAL PAIN, LEFT LOWER QUADRANT (ICD-789.04)  mild. Normal colonoscopy in 2005. Will give stool cards given report of one episode of blood in stool.   Orders: [place]- Est  Level 4 [id number])  Problem # 3:  HYPERTENSION, BENIGN SYSTEMIC (ICD-401.1)  good control today. Her updated medication list for this problem includes:    Lopressor 50 Mg Tabs (Metoprolol tartrate) ..... One by mouth bid    Cardizem Cd 180  Mg Xr24h-cap (Diltiazem hcl coated beads) ..... One by mouth daily    Furosemide 40 Mg Tabs (Furosemide) ..... One by mouth daily  Orders: [place]- Est  Level 4 [id number])  Problem # 4:  HYPERTROPHIC OBSTRUCTIVE CARDIOMYOPATHY (ICD-425.1) Assessment: Comment Only has follow-up soon with Dr. [name].   Problem # 5:  PANIC ATTACKS (ICD-300.01) Assessment: Unchanged  Will work on weaning down benzo and start on TCA today. This hopeful with help with anxiety and allow her to taper the benzo over time. follow-up in 4 weeks to re-evaluate .  Her updated medication list for this problem includes:    Xanax 0.5 Mg Tabs (Alprazolam) ..... One by mouth bed as needed anxiety    Nortriptyline Hcl 50 Mg Caps (Nortriptyline hcl) ..... One by mouth at night  Orders: [place]- Est  Level 4 ([address]214)  Complete Medication List: 1)  Crestor 10 Mg Tabs (Rosuvastatin calcium) .[name].. 1 tablet by mouth every night 2)  Klor-con M20 20 Meq Tbcr (Potassium chloride crys cr) .... Take 1 tablet by mouth once a day 3)  Xanax 0.5 Mg Tabs (Alprazolam) .... One by mouth bed as needed anxiety 4)  Tylenol Extra Strength 500 Mg Tabs (Acetaminophen) .... Take one tablet every six hours for belly/side pain 5)  Proair Hfa 108 (90 Base) Mcg/act Aers (Albuterol sulfate) .... Two puffs every 4-6 hours as needed 6)  Aspirin Adult Low Strength 81 Mg Tbec (Aspirin) .... One by mouth daily 7)  Lopressor 50 Mg Tabs (Metoprolol tartrate) .... One by mouth bid 8)  Cardizem Cd 180 Mg Xr24h-cap (Diltiazem hcl coated beads) .... One by mouth daily 9)  Furosemide 40 Mg Tabs (Furosemide) .... One by mouth daily 10)  Centrum Tabs (Multiple vitamins-minerals) .... One by mouth daily 11)  Fish Oil Oil (Fish oil) .... Daily 12)  Ventolin Hfa 108 (90 Base) Mcg/act Aers (Albuterol sulfate) .... 2 puffs q 4 hoiurs 13)  Co Q-10 30 Mg Caps (Coenzyme q10) .... Daily 14)  Vitamin C 500 Mg Tabs (Ascorbic acid) 15)  Otc Laxatives  .... As needed 16)   Nortriptyline Hcl 50 Mg Caps (Nortriptyline hcl) .... One by mouth at night  Other Orders: Comp Met[facility] [id number]) Direct LDL[facility] [id number])  Patient Instructions: 1)  Start the nortiptylene for your anxiety. Reduce the amount of xanax little by little. Let me know if you're having problems. 2)  We'll let you know if there's any problem with your blood work.  3)  follow-up with me in one month. 4)  Send in the stool cards. Prescriptions: CARDIZEM CD 180 MG XR24H-CAP (DILTIAZEM HCL COATED BEADS) one by mouth daily  #90 x 1   Entered and Authorized by:   Eugenie Norrie  MD   Signed by:   Eugenie Norrie  MD on 01/11/2010   Method used:   Electronically to        [facility] [address] [phone]* (retail)       [address]       [address]       [place], [place]  [address]       Ph: [phone]       Fax: [phone]   RxID:   [id number] LOPRESSOR 50 MG  TABS (METOPROLOL TARTRATE) one by mouth bid  #180 x 1   Entered and Authorized by:   Eugenie Norrie  MD   Signed by:   Eugenie Norrie  MD on 01/11/2010   Method used:   Electronically to        [facility] [address] [phone]* (retail)       [address]       [address]       [place], [place]  [address]       Ph: [phone]       Fax: [phone]   [id number] XANAX 0.5 MG TABS (ALPRAZOLAM) one by mouth bed as needed anxiety  #30 x 0   Entered and Authorized by:   Eugenie Norrie  MD   Signed by:   Eugenie Norrie  MD on 01/11/2010   Method used:   Print then Give to Patient   RxID:   [id number] NORTRIPTYLINE HCL 50 MG CAPS (NORTRIPTYLINE HCL) one by mouth at night  #30 x 2   Entered and Authorized by:   Eugenie Norrie  MD   Signed by:   Eugenie Norrie  MD on 01/11/2010   Method used:   Print then Give to Patient   RxID:   [id number]    Prevention & Chronic Care Immunizations   Influenza vaccine: Not documented   Influenza vaccine due: Refused  (12/12/2008)    Tetanus booster:  03/15/2004: Done.   Tetanus booster due: 03/15/2014    Pneumococcal vaccine: Not documented    H. zoster vaccine: Not documented  Colorectal Screening   Hemoccult: Done.  (10/15/1996)   Hemoccult due: Not Indicated    Colonoscopy: Done.  (04/14/2004)   Colonoscopy due: 04/14/2014  Other Screening   Pap smear: NEGATIVE FOR INTRAEPITHELIAL LESIONS OR MALIGNANCY.  (04/25/2009)   Pap smear due: Not Indicated    Mammogram: Normal  (04/05/2009)   Mammogram due: 04/2010    DXA bone density scan: Done.  (04/14/2004)   DXA scan due: None    Smoking status: current  (01/11/2010)   Smoking cessation counseling: YES  (03/26/2009)  Lipids   Total Cholesterol: Not documented   LDL: 85  (12/12/2008)   LDL Direct: 59  (07/05/2009)   HDL: Not documented   Triglycerides: Not documented    SGOT (AST): 24  (07/05/2009)   SGPT (ALT): 12  (07/05/2009) CMP ordered    Alkaline phosphatase: 46  (07/05/2009)   Total bilirubin: 0.3  (07/05/2009)  Hypertension   Last Blood Pressure: 104 / 73  (01/11/2010)   Serum creatinine: 1.4  (10/01/2009)   Serum potassium 4.8  (10/01/2009) CMP ordered   Self-Management Support :   Personal Goals (by the next clinic visit) :      Personal blood pressure goal: 140/90  (01/11/2010)     Personal LDL goal: 100  (01/11/2010)    Hypertension self-management support: Written self-care plan  (01/11/2010)   Hypertension self-care plan printed.  Hypertension self-management support not done because: Good outcomes  (01/11/2010)    Lipid self-management support: Written self-care plan  (01/11/2010)   Lipid self-care plan printed.    Lipid self-management support not done because: Good outcomes  (01/11/2010)

## 2011-01-14 NOTE — Letter (Signed)
Summary: [facility] Instructions  [facility]  [place], [place] [address]   Phone: [phone]  Fax: [phone]       [name]    1945-12-18    MRN: [id number]       Procedure Day /Date: 08/13/10 Tuesday     Arrival Time: 7:30 am     Procedure Time: 8:30 am     Location of Procedure:                     _x _  [facility] ( Outpatient Registration)   [place]  Starting 5 days prior to your procedure (08/08/10) do not eat nuts, seeds, popcorn, corn, beans, peas,  salads, or any raw vegetables.  Do not take any fiber supplements (e.g. Metamucil, Citrucel, and Benefiber). ____________________________________________________________________________________________________   THE DAY BEFORE YOUR PROCEDURE         DATE: 08/12/10 DAY: Monday  1   Drink clear liquids the entire day-NO SOLID FOOD  2   Do not drink anything colored red or purple.  Avoid juices with pulp.  No orange juice.  3   Drink at least 64 oz. (8 glasses) of fluid/clear liquids during the day to prevent dehydration and help the prep work efficiently.  CLEAR LIQUIDS INCLUDE: Water Jello Ice Popsicles Tea (sugar ok, no milk/cream) Powdered fruit flavored drinks Coffee (sugar ok, no milk/cream) Gatorade Juice: apple, white grape, white cranberry  Lemonade Clear bullion, consomm, broth Carbonated beverages (any kind) Strained chicken noodle soup Hard Candy  4   Mix the entire bottle of Miralax with 64 oz. of Gatorade/Powerade in the morning and put in the refrigerator to chill.  5   At 3:00 pm take 2 Dulcolax/Bisacodyl tablets.  6   At 4:30 pm take one Reglan/Metoclopramide tablet.  7  Starting at 5:00 pm drink one 8 oz glass of the Miralax mixture every 15-20 minutes until you have finished drinking the entire 64 oz.  You should finish drinking prep around 7:30 or 8:00 pm.  8   If you are nauseated, you may take the 2nd  Reglan/Metoclopramide tablet at 6:30 pm.        9    At 8:00 pm take 2 more DULCOLAX/Bisacodyl tablets.       THE DAY OF YOUR PROCEDURE      DATE:  08/13/10 DAY: Tuesday  You may drink clear liquids until 4:30 am  (4 HOURS BEFORE PROCEDURE).   MEDICATION INSTRUCTIONS  Unless otherwise instructed, you should take regular prescription medications with a small sip of water as early as possible the morning of your procedure.        OTHER INSTRUCTIONS  You will need a responsible adult at least 67 years of age to accompany you and drive you home.   This person must remain in the waiting room during your procedure.  Wear loose fitting clothing that is easily removed.  Leave jewelry and other valuables at home.  However, you may wish to bring a book to read or an iPod/MP3 player to listen to music as you wait for your procedure to start.  Remove all body piercing jewelry and leave at home.  Total time from sign-in until discharge is approximately 2-3 hours.  You should go home directly after your procedure and rest.  You can resume normal activities the day after your procedure.  The day of your procedure you should not:   Drive  Make legal decisions   Operate machinery   Drink alcohol   Return to work  You will receive specific instructions about eating, activities and medications before you leave.   The above instructions have been reviewed and explained to me by   [place] Deborra Medina)  July 24, 2010 2:31 PM     I fully understand and can verbalize these instructions _____________________________ Date 07/24/10

## 2011-01-14 NOTE — Procedures (Signed)
Summary: COLON   Colonoscopy  Procedure date:  05/01/2004  Findings:      Location:  [facility].    Procedures Next Due Date:    Colonoscopy: 05/2007 Patient Name: [name], [name] MRN:  Procedure Procedures: Colonoscopy CPT: H7044205.    with polypectomy. CPT: S2983155.  Personnel: Endoscopist: Dora L. [name], MD.  Referred By: Vanna Scotland [name], MD.  Exam Location: Exam performed in Outpatient Clinic. Outpatient  Patient Consent: Procedure, Alternatives, Risks and Benefits discussed, consent obtained, from patient. Consent was obtained by the RN.  Indications  Increased Risk Screening: For family history of colorectal neoplasia, in  sibling 2 siblings  History  Current Medications: Patient is taking an non-steroidal medication. Patient is not currently taking Coumadin.  Pre-Exam Physical: Performed May 01, 2004. Cardio-pulmonary exam, Rectal exam, HEENT exam , Abdominal exam, Extremity exam, Neurological exam, Mental status exam WNL.  Exam Exam: Extent of exam reached: Cecum, extent intended: Cecum.  The cecum was identified by appendiceal orifice and IC valve. Colon retroflexion performed. Images taken. ASA Classification: II. Tolerance: good.  Monitoring: Pulse and BP monitoring, Oximetry used. Supplemental O2 given.  Colon Prep Used Miralax for colon prep. Prep results: good.  Sedation Meds: Patient assessed and found to be appropriate for moderate (conscious) sedation. Fentanyl 75 mcg. given IV. Versed 5 mg. given IV.  Findings - MULTIPLE POLYPS: Ascending Colon to Transverse Colon. minimum size 5 mm, maximum size 8 mm. Procedure:  snare with cautery, removed, Polyp retrieved, 5 polyps ICD9: Colon Polyps: 211.3.  POLYP: Sigmoid Colon, Maximum size: 6 mm. pedunculated polyp. Distance from Anus 30 cm. Procedure:  snare with cautery, removed, retrieved, sent to pathology. ICD9: Colon Polyps: 211.3.   Assessment Abnormal examination, see  findings above.  Diagnoses: 211.3: Colon Polyps.   Comments: s/p multiple snare polypectomies Events  Unplanned Interventions: No intervention was required.  Unplanned Events: There were no complications. Plans  Post Exam Instructions: No aspirin or non-steroidal containing medications: 2 weeks.  Medication Plan: Await pathology.  Patient Education: Patient given standard instructions for: Yearly hemoccult testing recommended. Patient instructed to get routine colonoscopy every 3 years.  Comments: no ASA for 2 weeks Disposition: After procedure patient sent to recovery. After recovery patient sent home.   This report was created from the original endoscopy report, which was reviewed and signed by the above listed endoscopist.

## 2011-01-14 NOTE — Miscellaneous (Signed)
Summary: dx code correction   Clinical Lists Changes  Problems: Changed problem from PACEMAKER (ICD-V45.[name]01) to PACEMAKER, PERMANENT (ICD-V45.01)  changed the incorrect dx code to correct dx code Valeria Batman  October 11, 2010 10:13 AM

## 2011-01-14 NOTE — Miscellaneous (Signed)
Summary: RX Refill  Patient needs a refill of Alprazolam .  She made an appt with Axel Frisk for 10/13. [name]  August 22, 2010 2:39 PM

## 2011-01-14 NOTE — Cardiovascular Report (Signed)
Summary: Office Visit  Office Visit   Imported By: Marilynne Drivers 08/09/2010 10:05:02  _____________________________________________________________________  External Attachment:    Type:   Image     Comment:   External Document

## 2011-01-14 NOTE — Assessment & Plan Note (Signed)
Summary: dizzy/lighheaded/[place]/everheaRT   Vital Signs:  Patient profile:   65 year old female Height:      64 inches Weight:      180 pounds BMI:     31.01 BSA:     1.87 Temp:     99.8 degrees F Pulse rate:   82 / minute Pulse (ortho):   70 / minute BP supine:   125 / 88 BP sitting:   125 / 84 BP standing:   128 / 85  Vitals Entered By: Christen Bame CMA (May 14, 2010 2:28 PM)  Serial Vital Signs/Assessments:  Time      Position  BP       Pulse  Resp  Temp     By 2:50 PM                      82                    Patria Mane  MD 2:50 PM                      69                    Patria Mane  MD 2:50 PM                      70                    Patria Mane  MD  CC: lightheaded and dizzy x 2 weeks Is Patient Diabetic? No Pain Assessment Patient in pain? no        Primary Care Provider:  Eugenie Norrie  MD  CC:  lightheaded and dizzy x 2 weeks.  History of Present Illness: dizzy/lightheaded:  cannot clarify exactly how long but maybe a few weeks.  will only last for a few seconds.  sometimes feels lightheaded.  sometimes feels a quick spin she reports.  thinks maybe it is worse with changing positions but she isn't sure.  doesn't happen when resting int he bed. has noticed increase in fatigue.  also a little headache  at times and also a small rash on her back which she wonders if it is shingles.  denies hearing changes.  denies stuffy nose or ear fullness.  thinks maybe she is having a few more palpitations than before but this is a chronic problem.  no chest pains.  no actual syncope.  denies shortess of breath.  hasn't tried anything to help.  no new meds.  only change is with her furosemide that dr Sarita Haver had her hold a few days ago due to renal insufficiency.  Habits & Providers  Alcohol-Tobacco-Diet     Tobacco Status: current     Tobacco Counseling: to quit use of tobacco products     Cigarette Packs/Day: 0.5  Current Medications (verified): 1)  Crestor  10 Mg Tabs (Rosuvastatin Calcium) .[name].. 1 Tablet By Mouth Every Night 2)  Klor-Con M20 20 Meq Tbcr (Potassium Chloride Crys Cr) .... Take 1 Tablet By Mouth Once A Day 3)  Xanax 0.5 Mg Tabs (Alprazolam) .... One By Mouth Bed As Needed Anxiety 4)  Tylenol Extra Strength 500 Mg  Tabs (Acetaminophen) .... Take One Tablet Every Six Hours For Belly/side Pain 5)  Proair Hfa 108 (90 Base) Mcg/act  Aers (Albuterol Sulfate) .... Two Puffs Every 4-6 Hours As Needed 6)  Lopressor  50 Mg  Tabs (Metoprolol Tartrate) .... One By Mouth Bid 7)  Cardizem Cd 180 Mg Xr24h-Cap (Diltiazem Hcl Coated Beads) .... One By Mouth Daily 8)  Furosemide 40 Mg Tabs (Furosemide) .... One By Mouth Daily 9)  Centrum  Tabs (Multiple Vitamins-Minerals) .... One By Mouth Daily 10)  Fish Oil  Oil (Fish Oil) .... Daily 11)  Ventolin Hfa 108 (90 Base) Mcg/act Aers (Albuterol Sulfate) .... 2 Puffs Q 4 Hoiurs 12)  Co Q-10 30 Mg  Caps (Coenzyme Q10) .... Daily 13)  Otc Laxatives .... As Needed 14)  Asa(Nsaid) .[name].. 1 Tab Once Daily 15)  Coenzyme Q10 100 Mg Caps (Coenzyme Q10) .[name].. 1 Tab As Needed 16)  Fluconazole 150 Mg Tabs (Fluconazole) .... One By Mouth X 1 Then Repeat in 72 Hours  Allergies (verified): 1)  ! Ace Inhibitors  Past History:  Past medical, surgical, family and social histories (including risk factors) reviewed for relevance to current acute and chronic problems.  Past Medical History: Reviewed history from 04/16/2009 and no changes required.  1. Panic attacks 2. Chronic asthma. 3. Hypertrophic cardiomyopathy. 4. Diastolic heart failure, now euvolemic. 5. Normal coronary angiography. 6. Status post dual-mode, dual-pacing, dual-sensing pacemaker for     complete atrioventricular block, gen change 1998, 2004 7. Hypertension. 8. Hyperlipidemia. 9. Continued cigarette use.  10. Hx angioedema due to ACEI  Past Surgical History: Reviewed history from 12/12/2008 and no changes required. Cardiac cath 5/09: normal  angiography with LVH c/w hypertrophic CM. Normal PAW of 6.   BMD Calcaneal T-score= -0.24 - 01/11/2002, Cardiolite 9/00 - neg ischemia - 04/30/2000, s/p BTL - 04/01/2000, s/p pacemaker placement -1998 -  Family History: Reviewed history from 01/25/2009 and no changes required. CAD, HTN - many family members DM- sister Domenic Moras- brother, father MI age 43 brother, 83s father.   Stroke - dad 59, mom 82.  Social History: Reviewed history from 01/25/2009 and no changes required. On disability for heart failure/pacemaker.  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 4 grown children - aged 42, 65.  8 grandchildren.  Drinks Etoh - up to 1/2 pint nightly depending on her stress level. 8-12 cigs/day.  lives with daughter [name]  Review of Systems       per HPI  Physical Exam  General:  vital signs reviewed - not orthostatic Alert, appropriate; well-dressed and well-nourished  Neurologic:  No cranial nerve deficits noted. Station and gait are normal. DTRs are symmetrical throughout. Sensory, motor and coordinative functions appear intact.strength normal in all extremities, sensation intact to light touch, finger-to-nose normal, and Romberg negative.  alert and oriented   Impression & Recommendations:  Problem # 1:  DIZZINESS (ICD-780.4) Assessment New  examination and vitals all WNL today and unrevealing for cause of dizziness.  ? BPV but wasn't confirmed with nystagums today.  ? alcohol abuse  ? some memory issues as cause 2/2 alcohol abuse ? somatization d/o. for now monitor closely, f/u labs from today.  patient reassurred.   Orders: [place]- Est Level  3 [id number])  Complete Medication List: 1)  Crestor 10 Mg Tabs (Rosuvastatin calcium) .[name].. 1 tablet by mouth every night 2)  Klor-con M20 20 Meq Tbcr (Potassium chloride crys cr) .... Take 1 tablet by mouth once a day 3)  Xanax 0.5 Mg Tabs (Alprazolam) .... One by mouth bed as needed anxiety 4)  Tylenol Extra Strength 500 Mg Tabs  (Acetaminophen) .... Take one tablet every six hours for belly/side pain 5)  Proair Hfa  108 (90 Base) Mcg/act Aers (Albuterol sulfate) .... Two puffs every 4-6 hours as needed 6)  Lopressor 50 Mg Tabs (Metoprolol tartrate) .... One by mouth bid 7)  Cardizem Cd 180 Mg Xr24h-cap (Diltiazem hcl coated beads) .... One by mouth daily 8)  Furosemide 40 Mg Tabs (Furosemide) .... One by mouth daily 9)  Centrum Tabs (Multiple vitamins-minerals) .... One by mouth daily 10)  Fish Oil Oil (Fish oil) .... Daily 11)  Ventolin Hfa 108 (90 Base) Mcg/act Aers (Albuterol sulfate) .... 2 puffs q 4 hoiurs 12)  Co Q-10 30 Mg Caps (Coenzyme q10) .... Daily 13)  Otc Laxatives  .... As needed 14)  Asa(nsaid)  .[name].. 1 tab once daily 15)  Coenzyme Q10 100 Mg Caps (Coenzyme q10) .[name].. 1 tab as needed 16)  Fluconazole 150 Mg Tabs (Fluconazole) .... One by mouth x 1 then repeat in 72 hours  Patient Instructions: 1)  At this time your dizziness does not appear to be from a dangerous cause.  I would monitor it over time and if it gets worse call to be seen by Dr Sarita Haver.   2)  Certainly if it is associated with any chest pains, shorteness of breath or other worrisome signs let us know right away. 3)  We will let you know if there is anything abnormal on today's blood work.

## 2011-01-14 NOTE — Progress Notes (Signed)
Summary: questiona bout meds   Phone Note From Pharmacy Call back at [phone]   Caller: [facility] Dr. [phone]* Summary of Call: Pharmacy has question about medication Initial call taken by: Delsa Sale,  November 19, 2010 2:28 PM  Follow-up for Phone Call        I called and spoke with the pharmacist. They wanted to clarify if she was supposed to be on amiodarone 400mg  once daily or 200mg  once daily. I explained she was supposed to be taking 400mg  1/2 once daily starting at the end of November, but she was not doing this, so I explained to her I was going to call in amiodarone 200mg  tabs for her to take one once daily to the pharmacy. Follow-up by: Alvis Lemmings, RN, BSN,  November 19, 2010 3:09 PM

## 2011-01-14 NOTE — Letter (Signed)
Summary: Generic Letter  [facility]  [address]   [place], [place] [address]   Phone: [phone]  Fax: [phone]    06/07/2010  [name] [address] [place], [place]  [address]  Dear [name],  I wanted to let you know that your kidney test came back at 1.5. This is just a little high for you. Keep taking your medicines as we discussed and be sure to drink plenty of water. I would like to recheck this test in a week. You can come in for the blood draw and leave afterwards. Hope you are well. Call with any questions.  Sincerely,   Eugenie Norrie  MD  Appended Document: Generic Letter MAILED.

## 2011-01-14 NOTE — Letter (Signed)
Summary: Patient Notice- Polyp Results  [facility]  [address] [place], [place] [address]   Phone: [phone]  Fax: [phone]        August 14, 2010 MRN: [id number]    [name] [address] [place], [place]  [address]    Dear Ms. Brager-POLK,  I am pleased to inform you that the colon polyp(s) removed during your recent colonoscopy was (were) found to be benign (no cancer detected) upon pathologic examination.The polyp was adenomatous ( precancerous)  I recommend you have a repeat colonoscopy examination in 5_ years to look for recurrent polyps, as having colon polyps increases your risk for having recurrent polyps or even colon cancer in the future.  Should you develop new or worsening symptoms of abdominal pain, bowel habit changes or bleeding from the rectum or bowels, please schedule an evaluation with either your primary care physician or with me.  Additional information/recommendations:  _x_ No further action with gastroenterology is needed at this time. Please      follow-up with your primary care physician for your other healthcare      needs.  __ Please call [phone] to schedule a return visit to review your      situation.  __ Please keep your follow-up visit as already scheduled.  __ Continue treatment plan as outlined the day of your exam.  Please call us if you are having persistent problems or have questions about your condition that have not been fully answered at this time.  Sincerely,  [name] MD  This letter has been electronically signed by your physician.  Appended Document: Patient Notice- Polyp Results letter mailed to patient's home

## 2011-01-14 NOTE — Miscellaneous (Signed)
Summary: Update problem list   Clinical Lists Changes  Problems: Removed problem of CHF (ICD-428.0)

## 2011-01-14 NOTE — Miscellaneous (Signed)
Summary: dx code correction   Clinical Lists Changes  Problems: Changed problem from PACEMAKER (ICD-V45.[name]01) to PACEMAKER (ICD-V45.[name]01)  changed the incorrect dx code to correct dx code Valeria Batman  October 11, 2010 10:07 AM

## 2011-01-14 NOTE — Assessment & Plan Note (Signed)
Summary: Meet new doctor: smoking, HTN, hyperglycemia   Vital Signs:  Patient profile:   65 year old female Height:      64 inches Weight:      179.3 pounds BMI:     30.89 Temp:     98.4 degrees F oral Pulse rate:   70 / minute BP sitting:   130 / 95  (right arm) Cuff size:   large  Vitals Entered By: [name], CMA (September 26, 2010 2:49 PM) CC: Meet new doc/ med refill Is Patient Diabetic? Yes Did you bring your meter with you today? No Pain Assessment Patient in pain? no        Referring Provider:  Dr Eustace Quail Primary Provider:  Dr Sarita Haver  CC:  Meet new doc/ med refill.  History of Present Illness: Patient here for daughter today to meet me & for med refills.  1. Hyperglycemia.  Most recent A1c May 2011 was 6.1.  Does not check sugars at home. ROS: denies numbness/tingling of extremities, chest pain, heart palpitations  2. Stage III, CKD Last Cr was stable at 1.59 in July 2011. ROS: denies swelling in legs.  3. Smoking/COPD/asthma Still smoking. Now 10 cigarettes a day.  Has tried multiple things in past to quit but has not liked the patch, gum, or Chantix. Knows she should quit. Usually inhaler about twice a week.  ROS: no new dyspnea  4. HTN Ran out of furosemide several days ago but has been taking her other medications.  5. h/o diastolic heart failure and atrial fibrillation, now with pacemaker Being followed by [facility], Dr. [name].  Saw him last time in June 2011. Next appointment is in November.   6. Alopecia Her hair has been falling out ever since this summer.    Current Medications (verified): 1)  Crestor 10 Mg Tabs (Rosuvastatin Calcium) .[name].. 1 Tablet By Mouth Every Night 2)  Klor-Con M20 20 Meq Tbcr (Potassium Chloride Crys Cr) .... Take 1 Tablet By Mouth Once A Day 3)  Xanax 0.5 Mg Tabs (Alprazolam) .... One By Mouth Bed As Needed Anxiety 4)  Tylenol Extra Strength 325 Mg  Tabs (Acetaminophen) .... Take One Tablet Every  Six Hours For Belly/side Pain 5)  Proair Hfa 108 (90 Base) Mcg/act  Aers (Albuterol Sulfate) .... Two Puffs Every 4-6 Hours As Needed 6)  Carvedilol 12.5 Mg Tabs (Carvedilol) .... Take One Tablet By Mouth Twice A Day 7)  Cardizem Cd 180 Mg Xr24h-Cap (Diltiazem Hcl Coated Beads) .... One By Mouth Daily 8)  Furosemide 40 Mg Tabs (Furosemide) .... One By Mouth Daily 9)  Otc Laxatives .... As Needed 10)  Aspirin 81 Mg Tbec (Aspirin) .... Take 1 Tablet By Mouth Once Daily 11)  Omeprazole 20 Mg Cpdr (Omeprazole) .... Daily 12)  Flovent Hfa 44 Mcg/act Aero (Fluticasone Propionate  Hfa) .... As Directed  Allergies: 1)  ! Ace Inhibitors  Social History: On disability for heart failure/pacemaker.   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  4 grown children - aged 56, 39.  8 grandchildren.   Drinks alcohol a few times a week socially. At one time, the most she reports drinking is about 3 mixed drinks. Lives with daughter [name] Illicit Drug Use - no  Physical Exam  General:  appears well Head:  longer hair on right side of head and shorter hair on left side, as if she cut it short purposefully; no patches of absence of hair, no thinning of hair  density Eyes:  no hemorrhages or cotton wool spots appreciated on retina Lungs:  CTAB, no wheezes/rales/ronchi, good aeration, no tachypnea/increased WOB Heart:  RRR, no m/r/g Abdomen:  NABS, soft, NT, some distension Pulses:  2+ radial/DP pulses BL Extremities:  0-1+ LE edema  Diabetes Management Exam:    Foot Exam (with socks and/or shoes not present):       Sensory-Pinprick/Light touch:          Left medial foot (L-4): normal          Left dorsal foot (L-5): normal          Left lateral foot (S-1): normal          Right medial foot (L-4): normal          Right dorsal foot (L-5): normal          Right lateral foot (S-1): normal       Nails:          Left foot: normal          Right foot: normal   Impression &  Recommendations:  Problem # 1:  DYSPEPSIA (ICD-536.8) EGD and colonoscopy in August was normal, including biopsies of colonic biopsies and stomach. Abdominal U/S significant only for fatty liver. GI rec repeat colonoscopy in 5 years.  Patient now on Prilosec 20mg  once daily. Continue.   Problem # 2:  HYPERGLYCEMIA (ICD-790.29) Will check A1c today.   Orders: A1C-[facility] [id number]) [place]- Est Level  3 [id number])  Problem # 3:  CHRONIC KIDNEY DISEASE STAGE III (MODERATE) (ICD-585.3) Will check Cr today considering use of furosemide.  Orders: Comp Met-[facility] [id number])  Problem # 4:  HYPERTENSION, BENIGN SYSTEMIC (ICD-401.1) Will re-fill meds for now but will follow-up on Cr to assess kidney function on furosemide.   Her updated medication list for this problem includes:    Carvedilol 12.5 Mg Tabs (Carvedilol) .[name]... Take one tablet by mouth twice a day    Cardizem Cd 180 Mg Xr24h-cap (Diltiazem hcl coated beads) ..... One by mouth daily    Furosemide 40 Mg Tabs (Furosemide) ..... 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 Pt not want to do anything now, but may be interested in nicotine inhaler in future.  Orders: [place]- Est Level  3 [id number])  Problem # 6:  HAIR LOSS (ICD-704.00) Possible reason was due to beta blocker metoprolol she had been taking. There have been reports of alopecia with this beta-blocker and not with Coreg. Cardiology aware and thus changed her medication.   Problem # 7:  HYPERLIPIDEMIA (P102836.4) Continue statin. Will check CMET to assess liver function.  Her updated medication list for this problem includes:    Crestor 10 Mg Tabs (Rosuvastatin calcium) .[name]... 1 tablet by mouth every night  Orders: [facility]- Est Level  3 ([id number]) Comp Met-[facility] [id number])  Complete Medication List: 1)  Crestor 10 Mg Tabs (Rosuvastatin calcium) .[name].. 1 tablet by  mouth every night 2)  Klor-con M20 20 Meq Tbcr (Potassium chloride crys cr) .... Take 1 tablet by mouth once a day 3)  Xanax 0.5 Mg Tabs (Alprazolam) .... One by mouth bed as needed anxiety 4)  Tylenol Extra Strength 325 Mg Tabs (acetaminophen)  .... Take one tablet every six hours for belly/side pain 5)  Proair Hfa 108 (90 Base) Mcg/act Aers (Albuterol sulfate) .... Two puffs every 4-6 hours  as needed 6)  Carvedilol 12.5 Mg Tabs (Carvedilol) .... Take one tablet by mouth twice a day 7)  Cardizem Cd 180 Mg Xr24h-cap (Diltiazem hcl coated beads) .... One by mouth daily 8)  Furosemide 40 Mg Tabs (Furosemide) .... One by mouth daily 9)  Otc Laxatives  .... As needed 10)  Aspirin 81 Mg Tbec (Aspirin) .... Take 1 tablet by mouth once daily 11)  Omeprazole 20 Mg Cpdr (Omeprazole) .... Daily 12)  Flovent Hfa 44 Mcg/act Aero (Fluticasone propionate  hfa) .... As directed  Other Orders: Dental Referral (Dentist) Misc. Referral (Misc. Ref) Prescriptions: XANAX 0.5 MG TABS (ALPRAZOLAM) one by mouth bed as needed anxiety  #30 x 3   Entered and Authorized by:   Karen Kays MD   Signed by:   Sheral Flow Park MD on 09/26/2010   Method used:   Print then Give to Patient   RxID:   [id number] OMEPRAZOLE 20 MG CPDR (OMEPRAZOLE) daily  #30 x 3   Entered and Authorized by:   Karen Kays MD   Signed by:   Sheral Flow Park MD on 09/26/2010   Method used:   Electronically to        [facility] [address] [phone]* (retail)       [address]       [address]       [place], [place]  [address]       Ph: [phone]       Fax: [phone]   RxID:   [id number] FUROSEMIDE 40 MG TABS (FUROSEMIDE) one by mouth daily  #30 x 3   Entered and Authorized by:   Karen Kays MD   Signed by:   Sheral Flow Park MD on 09/26/2010   Method used:   Electronically to        [facility] [address] [phone]* (retail)       [address]       [address]       [place], [place]  [address]       Ph:  [phone]       Fax: [phone]   RxID:   [id number] CARDIZEM CD 180 MG XR24H-CAP (DILTIAZEM HCL COATED BEADS) one by mouth daily  #30 x 3   Entered and Authorized by:   Karen Kays MD   Signed by:   Sheral Flow Park MD on 09/26/2010   Method used:   Electronically to        [facility] [address] [phone]* (retail)       [address]       [address]       [place], [place]  [address]       Ph: [phone]       Fax: [phone]   RxID:   [id number] CARVEDILOL 12.5 MG TABS (CARVEDILOL) Take one tablet by mouth twice a day  #60 x 3   Entered and Authorized by:   Karen Kays MD   Signed by:   Sheral Flow Park MD on 09/26/2010   Method used:   Electronically to        [facility] [address] [phone]* (retail)       [address]       [address]       [place], [place]  [address]       Ph: [phone]       Fax: [phone]   RxID:   [id number] [place] M20  20 MEQ TBCR (POTASSIUM CHLORIDE CRYS CR) Take 1 tablet by mouth once a day  #30 x 3   Entered and Authorized by:   Karen Kays MD   Signed by:   Sheral Flow Park MD on 09/26/2010   Method used:   Electronically to        [facility] [address] [phone]* (retail)       [address]       [address]       [place], [place]  [address]       Ph: [phone]       Fax: [phone]   RxID:   [id number] CRESTOR 10 MG TABS (ROSUVASTATIN CALCIUM) 1 tablet by mouth every night  #30 x 3   Entered and Authorized by:   Karen Kays MD   Signed by:   Sheral Flow Park MD on 09/26/2010   Method used:   Electronically to        [facility] [address] [phone]* (retail)       [address]       [address]       [place],   [address]       Ph: [phone]       Fax: [phone]   RxID:   [id number]    Prevention & Chronic Care Immunizations   Influenza vaccine: Not documented   Influenza vaccine due: Refused  (12/12/2008)    Tetanus booster: 03/15/2004: Done.   Tetanus booster due:  03/15/2014    Pneumococcal vaccine: Not documented   Pneumococcal vaccine deferral: Refused  (02/08/2010)    H. zoster vaccine: Not documented  Colorectal Screening   Hemoccult: Done.  (10/15/1996)   Hemoccult due: Not Indicated    Colonoscopy: DONE  (08/13/2010)   Colonoscopy due: 04/14/2014  Other Screening   Pap smear: NEGATIVE FOR INTRAEPITHELIAL LESIONS OR MALIGNANCY.  (04/25/2009)   Pap smear due: Not Indicated    Mammogram: ASSESSMENT: Negative - BI-RADS 1^MM DIGITAL SCREENING  (04/23/2010)   Mammogram due: 04/2010    DXA bone density scan: Done.  (04/14/2004)   DXA scan due: None    Smoking status: current  (05/14/2010)   Smoking cessation counseling: YES  (03/26/2009)  Lipids   Total Cholesterol: Not documented   LDL: 85  (12/12/2008)   LDL Direct: 79  (01/11/2010)   HDL: Not documented   Triglycerides: Not documented    SGOT (AST): 17  (06/06/2010)   SGPT (ALT): 8  (06/06/2010) CMP ordered    Alkaline phosphatase: 51  (06/06/2010)   Total bilirubin: 0.3  (06/06/2010)  Hypertension   Last Blood Pressure: 130 / 95  (09/26/2010)   Serum creatinine: 1.59  (06/21/2010)   Serum potassium 4.6  (06/21/2010) CMP ordered   Self-Management Support :   Personal Goals (by the next clinic visit) :      Personal blood pressure goal: 140/90  (01/11/2010)     Personal LDL goal: 100  (01/11/2010)    Hypertension self-management support: Written self-care plan  (01/11/2010)    Hypertension self-management support not done because: Good outcomes  (01/11/2010)    Lipid self-management support: Written self-care plan  (01/11/2010)     Lipid self-management support not done because: Good outcomes  (01/11/2010)  Laboratory Results   Blood Tests   Date/Time Received: September 26, 2010 2:51 PM  Date/Time Reported: September 26, 2010 3:29 PM   HGBA1C: 6.1%   (Normal Range: Non-Diabetic - 3-6%   Control Diabetic -  6-8%)  Comments: ...........test performed  by...........Marland KitchenHedy Camara, CMA      Appended Document: Meet new doctor: smoking, HTN, hyperglycemia CMET results all normal. Cr 1.06 and no elevation in LFTs.

## 2011-01-14 NOTE — Assessment & Plan Note (Signed)
Summary: f/u eo   Vital Signs:  Patient profile:   65 year old female Height:      64 inches Weight:      182 pounds BMI:     31.35 BSA:     1.88 Temp:     98.4 degrees F Pulse rate:   78 / minute BP sitting:   128 / 84  Vitals Entered By: Christen Bame CMA (February 08, 2010 1:44 PM) CC: f/u Is Patient Diabetic? No Pain Assessment Patient in pain? no        Primary Care Provider:  Eugenie Norrie  MD  CC:  f/u.  History of Present Illness: 1. arm pain has noted R arm pain for about the last week; comes and goes. Notices numbness as well. Able write and attend to ADLs. Seems to be equally worse at night and day. Doesn't notice that any activities make it particularly worse. Tylenol has helped.  2. HTN Taking cardizem and lopressor without problems. Also on lasix. No problems with these medications  3. Anxiety hasn't started the nortriptylene. Just felt like she didn't need it. Is currently taking one xanax in the morning or in the middle of the day. Sates that it makes her more alert and makes her less forgetful.  4. hypertrophic CM Recently saw Dr. [name]. Last ECHO showed some decreased wall thickness and he is pleased with her progress at this point.   Habits & Providers  Alcohol-Tobacco-Diet     Tobacco Status: current     Tobacco Counseling: to quit use of tobacco products     Cigarette Packs/Day: 0.5  Current Medications (verified): 1)  Crestor 10 Mg Tabs (Rosuvastatin Calcium) .[name].. 1 Tablet By Mouth Every Night 2)  Klor-Con M20 20 Meq Tbcr (Potassium Chloride Crys Cr) .... Take 1 Tablet By Mouth Once A Day 3)  Xanax 0.5 Mg Tabs (Alprazolam) .... One By Mouth Bed As Needed Anxiety 4)  Tylenol Extra Strength 500 Mg  Tabs (Acetaminophen) .... Take One Tablet Every Six Hours For Belly/side Pain 5)  Proair Hfa 108 (90 Base) Mcg/act  Aers (Albuterol Sulfate) .... Two Puffs Every 4-6 Hours As Needed 6)  Lopressor 50 Mg  Tabs (Metoprolol Tartrate) .... One By  Mouth Bid 7)  Cardizem Cd 180 Mg Xr24h-Cap (Diltiazem Hcl Coated Beads) .... One By Mouth Daily 8)  Furosemide 40 Mg Tabs (Furosemide) .... One By Mouth Daily 9)  Centrum  Tabs (Multiple Vitamins-Minerals) .... One By Mouth Daily 10)  Fish Oil  Oil (Fish Oil) .... Daily 11)  Ventolin Hfa 108 (90 Base) Mcg/act Aers (Albuterol Sulfate) .... 2 Puffs Q 4 Hoiurs 12)  Co Q-10 30 Mg  Caps (Coenzyme Q10) .... Daily 13)  Otc Laxatives .... As Needed 14)  Asa(Nsaid) .[name].. 1 Tab Once Daily 15)  Coenzyme Q10 100 Mg Caps (Coenzyme Q10) .[name].. 1 Tab As Needed  Allergies (verified): 1)  ! Ace Inhibitors  Social History: Packs/Day:  0.5  Review of Systems       no chest pain or problems breathing. Occasional HA; no recent vision changes. Denies nausea, vomting. A few loose stools recently.  Physical Exam  Additional Exam:  General:  Vital signs reviewed -- overweight but otherwise normal Alert, appropriate; well-dressed and well-nourished Lungs:  work of breathing unlabored, clear to auscultation bilaterally; no wheezes, rales, or ronchi; good air movement throughout Heart:  regular rate and rhythm, no murmurs; normal s1/s2 Abd: +BS, soft, non-tender, non-distended; no masses; no rebound or guarding  Pulses:  DP and radial pulses 2+ bilaterally  Extremities:  no cyanosis, clubbing, or edema MSK: L arm - no TTP. Has 5/5 grip, biceps, triceps strength. No cervical or paracervical tenderness. Spurling's negative.  Neurologic:  alert and oriented. speech normal. CN II-XII intact. Normal strength. No facial droop.  Psych: alert and oriented. full affect, normally interactive. Good eye contact.     Impression & Recommendations:  Problem # 1:  ARM PAIN, RIGHT (ICD-729.5)  likely transiet MSK strain. Heat and tylenol as needed. Exam non-focal, benign.   Orders: [place]- Est  Level 4 [id number])  Problem # 2:  HYPERTENSION, BENIGN SYSTEMIC (ICD-401.1)  stable. continue current regimen.  Her updated  medication list for this problem includes:    Lopressor 50 Mg Tabs (Metoprolol tartrate) ..... One by mouth bid    Cardizem Cd 180 Mg Xr24h-cap (Diltiazem hcl coated beads) ..... One by mouth daily    Furosemide 40 Mg Tabs (Furosemide) ..... One by mouth daily  Orders: [place]- Est  Level 4 ([address])  Problem # 3:  PANIC ATTACKS (ICD-300.01)  pt did not start nortriptylene. Using xanax once per day. Advised that she try to wean herself off this medicine. She states she will try.  The following medications were removed from the medication list:    Nortriptyline Hcl 50 Mg Caps (Nortriptyline hcl) ..... One by mouth at night Her updated medication list for this problem includes:    Xanax 0.5 Mg Tabs (Alprazolam) ..... One by mouth bed as needed anxiety  Orders: [place]- Est  Level 4 [id number])  Problem # 4:  HYPERTROPHIC OBSTRUCTIVE CARDIOMYOPATHY (ICD-425.1) Assessment: Comment Only  followed by Dr. [name]. Last ECHO showed decrease in wall thickness. Continue to follow.   Orders: [place]- Est  Level 4 ([address])  Complete Medication List: 1)  Crestor 10 Mg Tabs (Rosuvastatin calcium) .[name].. 1 tablet by mouth every night 2)  Klor-con M20 20 Meq Tbcr (Potassium chloride crys cr) .... Take 1 tablet by mouth once a day 3)  Xanax 0.5 Mg Tabs (Alprazolam) .... One by mouth bed as needed anxiety 4)  Tylenol Extra Strength 500 Mg Tabs (Acetaminophen) .... Take one tablet every six hours for belly/side pain 5)  Proair Hfa 108 (90 Base) Mcg/act Aers (Albuterol sulfate) .... Two puffs every 4-6 hours as needed 6)  Lopressor 50 Mg Tabs (Metoprolol tartrate) .... One by mouth bid 7)  Cardizem Cd 180 Mg Xr24h-cap (Diltiazem hcl coated beads) .... One by mouth daily 8)  Furosemide 40 Mg Tabs (Furosemide) .... One by mouth daily 9)  Centrum Tabs (Multiple vitamins-minerals) .... One by mouth daily 10)  Fish Oil Oil (Fish oil) .... Daily 11)  Ventolin Hfa 108 (90 Base) Mcg/act Aers (Albuterol sulfate) .... 2 puffs q  4 hoiurs 12)  Co Q-10 30 Mg Caps (Coenzyme q10) .... Daily 13)  Otc Laxatives  .... As needed 14)  Asa(nsaid)  .[name].. 1 tab once daily 15)  Coenzyme Q10 100 Mg Caps (Coenzyme q10) .[name].. 1 tab as needed  Patient Instructions: 1)  Take the xanax no more than once a day. Try to work your way off this medicine. 2)  follow-up with me in 3-4 months. 3)  Use heat on your arm once or twice a day.  4)  Take tylenol as needed.  Prescriptions: XANAX 0.5 MG TABS (ALPRAZOLAM) one by mouth bed as needed anxiety  #30 x 0   Entered and Authorized by:   Eugenie Norrie  MD   Signed  by:   Eugenie Norrie  MD on 02/08/2010   Method used:   Print then Give to Patient   RxID:   [id number]    Prevention & Chronic Care Immunizations   Influenza vaccine: Not documented   Influenza vaccine due: Refused  (12/12/2008)    Tetanus booster: 03/15/2004: Done.   Tetanus booster due: 03/15/2014    Pneumococcal vaccine: Not documented   Pneumococcal vaccine deferral: Refused  (02/08/2010)    H. zoster vaccine: Not documented  Colorectal Screening   Hemoccult: Done.  (10/15/1996)   Hemoccult due: Not Indicated    Colonoscopy: Done.  (04/14/2004)   Colonoscopy due: 04/14/2014  Other Screening   Pap smear: NEGATIVE FOR INTRAEPITHELIAL LESIONS OR MALIGNANCY.  (04/25/2009)   Pap smear due: Not Indicated    Mammogram: Normal  (04/05/2009)   Mammogram due: 04/2010    DXA bone density scan: Done.  (04/14/2004)   DXA scan due: None    Smoking status: current  (02/08/2010)   Smoking cessation counseling: YES  (03/26/2009)  Lipids   Total Cholesterol: Not documented   LDL: 85  (12/12/2008)   LDL Direct: 79  (01/11/2010)   HDL: Not documented   Triglycerides: Not documented    SGOT (AST): 19  (01/11/2010)   SGPT (ALT): 9  (01/11/2010)   Alkaline phosphatase: 49  (01/11/2010)   Total bilirubin: 0.3  (01/11/2010)  Hypertension   Last Blood Pressure: 128 / 84  (02/08/2010)   Serum creatinine:  1.31  (01/11/2010)   Serum potassium 4.3  (01/11/2010)  Self-Management Support :   Personal Goals (by the next clinic visit) :      Personal blood pressure goal: 140/90  (01/11/2010)     Personal LDL goal: 100  (01/11/2010)    Hypertension self-management support: Written self-care plan  (01/11/2010)    Hypertension self-management support not done because: Good outcomes  (01/11/2010)    Lipid self-management support: Written self-care plan  (01/11/2010)     Lipid self-management support not done because: Good outcomes  (01/11/2010)

## 2011-01-14 NOTE — Progress Notes (Signed)
Summary: RX REFILL -pt completely out-needs asap  Medications Added AMIODARONE HCL 200 MG TABS (AMIODARONE HCL) Take one tablet by mouth daily       Phone Note Refill Request Call back at Home Phone [phone] Message from:  Patient on November 18, 2010 9:04 AM  Refills Requested: Medication #1:  PRADAXA 150 MG CAPS take one tab by mouth two times a day PT NEEDS A REFILL. PT WANTS TO KNOW IF DR WANTS TO CONTINUE ON MEDS. Festus Barren [id number]   Method Requested: Telephone to Pharmacy Initial call taken by: Regan Lemming,  November 18, 2010 9:05 AM  Follow-up for Phone Call        I spoke with the pt. She needs to have Pradaxa called in to the pharmacy. She also states she has only been getting 5 tablets of amiodarone in her bottle. I advised her I will call in a 200mg  tablet to the pharmacy for her.  Follow-up by: Alvis Lemmings, RN, BSN,  November 19, 2010 10:23 AM    New/Updated Medications: AMIODARONE HCL 200 MG TABS (AMIODARONE HCL) Take one tablet by mouth daily Prescriptions: PRADAXA 150 MG CAPS (DABIGATRAN ETEXILATE MESYLATE) take one tab by mouth two times a day  #60 x 4   Entered by:   Alvis Lemmings, RN, BSN   Authorized by:   Fatima Sanger, MD, [facility]   Signed by:   Alvis Lemmings, RN, BSN on 11/19/2010   Method used:   Electronically to        [facility] [address] [phone]* (retail)       [address]       [address]       [place], [place]  [address]       Ph: [phone]       Fax: [phone]   [id number] AMIODARONE HCL 200 MG TABS (AMIODARONE HCL) Take one tablet by mouth daily  #30 x 6   Entered by:   Alvis Lemmings, RN, BSN   Authorized by:   Fatima Sanger, MD, [facility]   Signed by:   Alvis Lemmings, RN, BSN on 11/19/2010   Method used:   Electronically to        [facility] [address] [phone]* (retail)       [address]       [address]       [place], [place]  [address]       Ph: [phone]       Fax: [phone]   [id number]

## 2011-01-14 NOTE — Letter (Signed)
Summary: Cardioversion/TEE Instructions  Press photographer, [place] [address] [place]   [place], [place] [address]   Phone: [phone]  Fax: [phone]    Cardioversion / TEE Cardioversion Instructions  11/04/2010 MRN: [id number]  [name] [place], [place]  [address]  Dear [name], You are scheduled for a Cardioversion on Tues. 11/12/10 with Dr. [name].   Please arrive at the [facility] at 11:00 am  on the day of your procedure.  1)   DIET:  A)   Nothing to eat or drink after midnight except your medications with a sip of water.   2)   Come to the [facility] office on ____N/A________________ for lab work. The lab at [facility] is open from 8:30 a.m. to 1:30 p.m. and 2:30 p.m. to 5:00 p.m. The lab at [facility] is open from 7:30 a.m. to 5:30 p.m. You do not have to be fasting.  3)   MAKE SURE YOU TAKE YOUR PRADAXA.  4)   A)   DO NOT TAKE these medications before your procedure:      ___________________________________________________________________     ___________________________________________________________________     ___________________________________________________________________  B)   YOU MAY TAKE ALL of your remaining medications with a small amount of water.    C)   START NEW medications:       ___________________________________________________________________     ___________________________________________________________________  5)  Must have a responsible person to drive you home.  6)   Bring a current list of your medications and current insurance cards.   * Special Note:  Every effort is made to have your procedure done on time. Occasionally there are emergencies that present themselves at the hospital that may cause delays. Please be patient if a delay does occur.  * If you have any questions after you get home, please call the office at  [phone].

## 2011-01-15 DIAGNOSIS — I4891 Unspecified atrial fibrillation: Secondary | ICD-10-CM

## 2011-01-15 DIAGNOSIS — Z7901 Long term (current) use of anticoagulants: Secondary | ICD-10-CM | POA: Insufficient documentation

## 2011-01-16 NOTE — Medication Information (Signed)
Summary: rov/sp   Anticoagulant Therapy  Managed by: Porfirio Oar, PharmD Referring MD: Rayann Heman PCP: Dr Sarita Haver Supervising MD: Percival Spanish MD, Jeneen Rinks Indication 1: Atrial Fibrillation Lab Used: [facility] [place] Site: [place] INR POC 1.8 INR RANGE 2.0-3.0  Dietary changes: yes       Details: ate collards on New Years  Health status changes: no    Bleeding/hemorrhagic complications: no    Recent/future hospitalizations: no    Any changes in medication regimen? no    Recent/future dental: no  Any missed doses?: yes     Details: may have missed last night's dose  Is patient compliant with meds? yes       Allergies: 1)  ! Ace Inhibitors  Anticoagulation Management History:      The patient is taking warfarin and comes in today for a routine follow up visit.  Positive risk factors for bleeding include an age of 67 years or older.  The bleeding index is 'intermediate risk'.  Positive CHADS2 values include History of CHF and History of HTN.  Negative CHADS2 values include Age > 67 years old.  Anticoagulation responsible provider: Percival Spanish MD, Jeneen Rinks.  INR POC: 1.8.  Cuvette Lot#: [id number].  Exp: 01/2012.    Anticoagulation Management Assessment/Plan:      The patient's current anticoagulation dose is Warfarin sodium 5 mg tabs: Use as directed by Anticoagulation Clinic.  The target INR is 2.0-3.0.  The next INR is due 12/25/2010.  Anticoagulation instructions were given to patient.  Results were reviewed/authorized by Porfirio Oar, PharmD.  She was notified by Porfirio Oar PharmD.         Prior Anticoagulation Instructions: Continue Pradaxa until you are out of samples.  Start Coumadin 5mg  once a day today.  Recheck INR in 1 week.   Current Anticoagulation Instructions: INR 1.8  Increase Coumadin to 1 tablet every day except 1 1/2 tablets on Wednesday and Saturday.  Recheck INR in 1 week.  Prescriptions: WARFARIN SODIUM 5 MG TABS (WARFARIN SODIUM) Use as  directed by Anticoagulation Clinic  #45 x 1   Entered by:   Porfirio Oar PharmD   Authorized by:   Thompson Grayer, MD   Signed by:   Porfirio Oar PharmD on 12/18/2010   Method used:   Electronically to        [facility] [address] [phone]* (retail)       [address]       [address]       [place], [place]  [address]       Ph: [phone]       Fax: [phone]   [place]:   [phone]

## 2011-01-16 NOTE — Cardiovascular Report (Signed)
Summary: Office Visit   Office Visit   Imported By: Sallee Provencal 12/12/2010 09:51:47  _____________________________________________________________________  External Attachment:    Type:   Image     Comment:   External Document

## 2011-01-16 NOTE — Assessment & Plan Note (Signed)
Summary: 3wk f/u   Medications Added PRADAXA 150 MG CAPS (DABIGATRAN ETEXILATE MESYLATE) take one tab by mouth two times a day/ out      Allergies Added:   Visit Type:  Follow-up Referring Provider:  Dr Eustace Quail Primary Provider:  Dr Sarita Haver   History of Present Illness: The patient is 65 years old and retired from management of hypertrophic cardiomyopathy and pacemaker and  atrial fibrillation after her recent cardioversion. She has a DDD pacemaker implanted for AV block and had a generator change in 2004. She is pacer dependent. She also has hypertrophic cardiomyopathy. She has a history of diastolic heart failure and history of normal cornea conjunctivae. Her pacemaker leads have been somewhat tight due to some migration of the pacemaker generator in the pocket.  She recently was found to be in atrial fibrillation. She  had some increased symptoms of fatigue over the past several months and decreased energy levels.  we started her on amiodarone and Pradaxa and brought her in for cardioversion. She returns now and is in sinus rhythm. She's been feeling well. However she had trouble with her insurance paying for Pradaxa and she has been off of it last week.    Or other problems include hypertension, hyperlipidemia, and cigarette use  Current Medications (verified): 1)  Crestor 10 Mg Tabs (Rosuvastatin Calcium) .[name].. 1 Tablet By Mouth Every Night 2)  Klor-Con M20 20 Meq Tbcr (Potassium Chloride Crys Cr) .... Take 1 Tablet By Mouth Once A Day 3)  Xanax 0.5 Mg Tabs (Alprazolam) .... One By Mouth Bed As Needed Anxiety 4)  Tylenol Extra Strength 325 Mg  Tabs (Acetaminophen) .... Take One Tablet Every Six Hours For Belly/side Pain 5)  Proair Hfa 108 (90 Base) Mcg/act  Aers (Albuterol Sulfate) .... Two Puffs Every 4-6 Hours As Needed 6)  Carvedilol 12.5 Mg Tabs (Carvedilol) .... Take One Tablet By Mouth Twice A Day 7)  Cardizem Cd 180 Mg Xr24h-Cap (Diltiazem Hcl Coated Beads) .... One By  Mouth Daily 8)  Furosemide 40 Mg Tabs (Furosemide) .... One By Mouth Daily 9)  Otc Laxatives .... As Needed 10)  Aspirin 81 Mg Tbec (Aspirin) .... Take 1 Tablet By Mouth Once Daily 11)  Omeprazole 20 Mg Cpdr (Omeprazole) .... Daily 12)  Flovent Hfa 44 Mcg/act Aero (Fluticasone Propionate  Hfa) .... As Directed 13)  Pradaxa 150 Mg Caps (Dabigatran Etexilate Mesylate) .... Take One Tab By Mouth Two Times A Day/ Out 14)  Amiodarone Hcl 200 Mg Tabs (Amiodarone Hcl) .... Take One Tablet By Mouth Daily  Allergies (verified): 1)  ! Ace Inhibitors  Past History:  Past Medical History: Reviewed history from 11/13/2010 and no changes required. Chronic problems: -Panic attacks -Chronic, persistent asthma. Cardiac:  -Hypertrophic cardiomyopathy - followed by Dr. [name] at [facility] on problem list [id number]  -Diastolic heart failure, now euvolemic.  -Normal coronary angiography.  -Status post dual-mode, dual-pacing, dual-sensing pacemaker for     complete atrioventricular block, gen change 1998, 2004  -h/o AF Other CV:  -Hypertension.  -Hyperlipidemia. -Continued cigarette use.  -h/o dyspepsia   Past problems: -h/o angioedema due to ACEI -h/o RLS (Dx 06/2007)  Review of Systems       ROS is negative except as outlined in HPI.   Vital Signs:  Patient profile:   65 year old female Height:      65 inches Weight:      180 pounds BMI:     30.06 Pulse rate:  127 / minute BP sitting:   100 / 70  (left arm)  Vitals Entered By: Margaretmary Bayley CMA (November 28, 2010 3:02 PM)  Physical Exam  Additional Exam:  Gen. Well-nourished, in no distress   Neck: No JVD, thyroid not enlarged, no carotid bruits Lungs: No tachypnea, clear without rales, rhonchi or wheezes Cardiovascular: Rhythm regular, PMI not displaced,  heart sounds  normal, no murmurs or gallops, no peripheral edema, pulses normal in all 4 extremities. Abdomen: BS normal, abdomen soft and non-tender without masses or  organomegaly, no hepatosplenomegaly. MS: No deformities, no cyanosis or clubbing   Neuro:  No focal sns   Skin:  no lesions    PPM Specifications Following MD:  Vanna Scotland. [name], MD     PPM Vendor:  [employer] Jude     PPM Model Number:  [id number]     PPM Serial Number:  [id number] PPM DOI:  11/17/2003     PPM Implanting MD:  Vanna Scotland. [name], MD  Lead 1    Location: [facility]     DOI: 07/08/1985     Model #: [id number]     Serial #: [id number]     Status: active Lead 2    Location: RV     DOI: 07/08/1987     Model #: [id number]     Serial #: [id number]     Status: active   Indications:  Complete Heart Block   PPM Follow Up Pacer Dependent:  Yes      Episodes Coumadin:  No  Parameters Mode:  DDDR     Lower Rate Limit:  70     Upper Rate Limit:  120 Paced AV Delay:  170     Sensed AV Delay:  150  Impression & Recommendations:  Problem # 1:  ATRIAL FIBRILLATION (ICD-427.31) She has maintained sinus rhythm since her cardioversion, although interrogation of pacemaker said she had several mode switches of 3 minutes or less. She is currently on amiodarone and unfortunately she did not take her Pradaxa for the last week. She has a [place] Vasc score of 4 with age greater than 75, female sex, hypertension, and diastolic CHF. We'll try to get her on Pradaxa to  last a few weeks and then start her on Coumadin and have her seen in the Coumadin clinic. Her updated medication list for this problem includes:    Carvedilol 12.5 Mg Tabs (Carvedilol) .[name]... Take one tablet by mouth twice a day    Aspirin 81 Mg Tbec (Aspirin) .[name]... Take 1 tablet by mouth once daily    Amiodarone Hcl 200 Mg Tabs (Amiodarone hcl) .[name]... Take one tablet by mouth daily  Orders: [facility] Referral (Coumadin clinic) EKG w/ Interpretation ([id number])  Her updated medication list for this problem includes:    Carvedilol 12.5 Mg Tabs (Carvedilol) .[name]... Take one tablet by mouth twice a day    Aspirin 81 Mg Tbec (Aspirin) .[name]... Take 1 tablet by mouth  once daily    Amiodarone Hcl 200 Mg Tabs (Amiodarone hcl) .[name]... Take one tablet by mouth daily  Problem # 2:  PACEMAKER (ICD-V45.01) She has a [employer]. Jude DDD pacemaker for AV block. She is pacer dependent. She has had a few short mode switches. Her longevity is only about 3 months. We'll have her seen in the pacemaker clinic in one month since she is not using the phone checks. We'll have her see Dr. Rayann Heman in 6 weeks. She has some retraction of  her atrial  and ventricular leads and when she has her generator change her pocket may need revision to allow more slack in the leads.  Problem # 3:  HYPERTENSION, BENIGN SYSTEMIC (ICD-401.1) This is well-controlled on current medications. Her updated medication list for this problem includes:    Carvedilol 12.5 Mg Tabs (Carvedilol) .[name]... Take one tablet by mouth twice a day    Cardizem Cd 180 Mg Xr24h-cap (Diltiazem hcl coated beads) ..... One by mouth daily    Furosemide 40 Mg Tabs (Furosemide) ..... One by mouth daily    Aspirin 81 Mg Tbec (Aspirin) .[name]... Take 1 tablet by mouth once daily  Patient Instructions: 1)  You will need to see Paula/ Cristin in the device clinic in: 1 month. 2)  Your physician recommends that you schedule a follow-up appointment in: 6 weeks with Dr. Rayann Heman. 3)  Continue Pradaxa 150mg  two times a day for 2 weeks. 4)  We will refer you to the coumadin clinic in: 2 weeks so we may transition you off Pradaxa and on to coumadin.

## 2011-01-16 NOTE — Assessment & Plan Note (Signed)
Summary: Olecranon bursitis, f/u HTN, update cardiac problems   Vital Signs:  Patient profile:   65 year old female Height:      65 inches Weight:      180 pounds BMI:     30.06 BSA:     1.89 Temp:     98.5 degrees F Pulse rate:   77 / minute BP sitting:   115 / 80  Vitals Entered By: Karen Kays MD (December 23, 2010 3:11 PM) CC: knot on left elbow Is Patient Diabetic? Yes Did you bring your meter with you today? No Pain Assessment Patient in pain? no        Referring Provider:  Dr Eustace Quail Primary Provider:  Karen Kays MD  CC:  knot on left elbow.  History of Present Illness: 1. HTN  -Compliant with meds (coreg, dilt, lasix, amiodarone)  -Doesn't measure pressures at home  -ROS denies chest pain, dyspnea  2. Smoking cessation   -smokes several cigarettes a day  -smokes more when anxious; family stressors  3. Knot left elbow Swollen past several days. Denies trauma or activity that could have brought it on. Denies pain, redness, fever. Only complaint is aesthetic and occasional initial itchiness. Not that itchy recently.   4. Asthma Uses inhaler two times a week.  Occasional night time cough every few days.   Habits & Providers  Alcohol-Tobacco-Diet     Tobacco Status: current     Tobacco Counseling: to quit use of tobacco products     Cigarette Packs/Day: 0.5  Current Medications (verified): 1)  Crestor 10 Mg Tabs (Rosuvastatin Calcium) .[name].. 1 Tablet By Mouth Every Night 2)  Klor-Con M20 20 Meq Tbcr (Potassium Chloride Crys Cr) .... Take 1 Tablet By Mouth Once A Day 3)  Xanax 0.5 Mg Tabs (Alprazolam) .... One By Mouth Bed As Needed Anxiety 4)  Tylenol Extra Strength 325 Mg  Tabs (Acetaminophen) .... Take One Tablet Every Six Hours For Belly/side Pain 5)  Proair Hfa 108 (90 Base) Mcg/act  Aers (Albuterol Sulfate) .... Two Puffs Every 4-6 Hours As Needed 6)  Carvedilol 12.5 Mg Tabs (Carvedilol) .... Take One Tablet By Mouth Twice A Day 7)   Cardizem Cd 180 Mg Xr24h-Cap (Diltiazem Hcl Coated Beads) .... One By Mouth Daily 8)  Furosemide 40 Mg Tabs (Furosemide) .... One By Mouth Daily 9)  Otc Laxatives .... As Needed 10)  Aspirin 81 Mg Tbec (Aspirin) .... Take 1 Tablet By Mouth Once Daily 11)  Omeprazole 20 Mg Cpdr (Omeprazole) .... Daily 12)  Flovent Hfa 44 Mcg/act Aero (Fluticasone Propionate  Hfa) .... As Directed 13)  Pacerone 400 Mg Tabs (Amiodarone Hcl) .... Take 1 Tablet Daily. 14)  Warfarin Sodium 5 Mg Tabs (Warfarin Sodium) .... Use As Directed By Anticoagulation Clinic 15)  Centrum  Tabs (Multiple Vitamins-Minerals) .[name].. 1 Tablet By Mouth Daily  Allergies: 1)  ! Ace Inhibitors  Past History:  Past Medical History: Chronic problems: -Panic attacks -Chronic, persistent asthma. Cardiac:  -hypertrophic cardiomyopathy (Dx 05/2008)--followed by Dr. [name] ([facility])  -diastolic heart failure (EDW 180#)    -ECHO 05/30/10: EF 55-60%; LV moderately dilated c increased thickness c/w moderate LVH    -Normal coronary angiography (04/2008) c LVH c/w hypertrophic CM, normal PAW 6    -Cardiolite 08/1999 --neg ischemia - 04/30/2000, s/p BTL - 04/01/2000  -s/p St. Jude DDD (dual-mode, dual-pacing, dual-sensing) pacemaker for complete AV block 1998  -atril fibrillation (Dx 10/2010)     -s/p failed  cardioversion (10/2010) now on coumadin      -Pradaxa tried by [facility] but stopped due to cost   Other CV:  -Hypertension.  -Hyperlipidemia. -Continued cigarette use.  -h/o dyspepsia   Past problems: -h/o angioedema due to ACEI -h/o RLS (Dx 06/2007)  Social History: On disability for heart failure/pacemaker.   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   Drinks alcohol a few times a week socially. At one time, the most she reports drinking is about 3 mixed drinks. Lives with daughter [name]. No illicit drugs. Smokes few cigarettes daily, especially when stressed.    Physical Exam  General:  alert and appropriate dress.   Lungs:  Normal respiratory effort, chest expands symmetrically. Lungs are clear to auscultation, no crackles or wheezes. Heart:  normal rate, regular rhythm, decreased heart sounds, no murmur auscultated Abdomen:  soft, non-tender, and normal bowel sounds.   Msk:  L elbow: discrete area of swelling above elbow with full ROM, no erythema, no tenderness Pulses:  2+ radial and pedal pulses Extremities:  trace left pedal edema and trace right pedal edema.   Skin:  warm, dry, pink Psych:  anxious, mildly depressed appearing   Impression & Recommendations:  Problem # 1:  OTHER DISORDERS SYNOVIUM TENDON AND BURSA OTHER (ICD-727.89) Assessment New Appears to be olecranon bursa brought on by chronic use. Recommend wrapping elbow up with ACE or other bandage to compress bursa and manually squeeze out fluid. Also suggested possible aspiration if bursa gets bigger, bothers patient, doesn't improve/gets worse.   Problem # 2:  HYPERTENSION, BENIGN SYSTEMIC (ICD-401.1)  Controlled. Continue current medications.   Her updated medication list for this problem includes:    Carvedilol 12.5 Mg Tabs (Carvedilol) .[name]... Take one tablet by mouth twice a day    Cardizem Cd 180 Mg Xr24h-cap (Diltiazem hcl coated beads) ..... One by mouth daily    Furosemide 40 Mg Tabs (Furosemide) ..... One by mouth daily  Orders: [facility]- Est  Level 4 [id number])  Problem # 3:  Preventive Health Care (ICD-V70.0) Assessment: Comment Only Continues to smoke. Not ready to quit smoking at this time due to significant family stressors. Will follow-up.  Patient denies flu shot. Amenable to pneumovax and zoster shot today.   Problem # 4:  ATRIAL FIBRILLATION (ICD-427.31) Assessment: Comment Only  Rhythm and rate controlled in clinic today. Will follow-up with Cardiology notes.   Her updated medication list for this problem includes:    Carvedilol 12.5 Mg Tabs (Carvedilol)  .[name]... Take one tablet by mouth twice a day    Cardizem Cd 180 Mg Xr24h-cap (Diltiazem hcl coated beads) ..... One by mouth daily    Aspirin 81 Mg Tbec (Aspirin) .[name]... Take 1 tablet by mouth once daily    Pacerone 400 Mg Tabs (Amiodarone hcl) .[name]... Take 1 tablet daily.    Warfarin Sodium 5 Mg Tabs (Warfarin sodium) ..... Use as directed by anticoagulation clinic  Orders: [facility]- Est  Level 4 [id number])  Problem # 5:  CHRONIC KIDNEY DISEASE STAGE III (MODERATE) (ICD-585.3) Assessment: Comment Only Cr good in 09/2010 @ 1.06. Will check in next visit to monitor.   Problem # 6:  ANXIETY (ICD-300.00) Assessment: Deteriorated  Will re-fill this med since only occasional use, less than one a day, and helps her anxiety. Will monitor more at next visit especially in light of significant family stressors.   Her updated medication list for this problem includes:    Xanax 0.5  Mg Tabs (Alprazolam) ..... One by mouth bed as needed anxiety  Orders: [facility]- Est  Level 4 [id number])  Complete Medication List: 1)  Crestor 10 Mg Tabs (Rosuvastatin calcium) .[name].. 1 tablet by mouth every night 2)  Klor-con M20 20 Meq Tbcr (Potassium chloride crys cr) .... Take 1 tablet by mouth once a day 3)  Xanax 0.5 Mg Tabs (Alprazolam) .... One by mouth bed as needed anxiety 4)  Tylenol Extra Strength 325 Mg Tabs (acetaminophen)  .... Take one tablet every six hours for belly/side pain 5)  Proair Hfa 108 (90 Base) Mcg/act Aers (Albuterol sulfate) .... Two puffs every 4-6 hours as needed 6)  Carvedilol 12.5 Mg Tabs (Carvedilol) .... Take one tablet by mouth twice a day 7)  Cardizem Cd 180 Mg Xr24h-cap (Diltiazem hcl coated beads) .... One by mouth daily 8)  Furosemide 40 Mg Tabs (Furosemide) .... One by mouth daily 9)  Otc Laxatives  .... As needed 10)  Aspirin 81 Mg Tbec (Aspirin) .... Take 1 tablet by mouth once daily 11)  Omeprazole 20 Mg Cpdr (Omeprazole) .... Daily 12)  Flovent Hfa 44 Mcg/act Aero (Fluticasone propionate   hfa) .... As directed 13)  Pacerone 400 Mg Tabs (Amiodarone hcl) .... Take 1 tablet daily. 14)  Warfarin Sodium 5 Mg Tabs (Warfarin sodium) .... Use as directed by anticoagulation clinic 15)  Centrum Tabs (Multiple vitamins-minerals) .[name].. 1 tablet by mouth daily  Other Orders: Pneumococcal Vaccine [id number]) Admin 1st Vaccine [id number])  Patient Instructions: 1)  For your left elbow, I think you have a bursa. For now, let's just try keeping it wrapped to help push the fluids out. If it bothers you, or get bigger, you may make an appointment to pull the fluid out.  2)  Please make an appointment to come and see me in 3 months.  3)  It was a pleasure to see you again.  Prescriptions: XANAX 0.5 MG TABS (ALPRAZOLAM) one by mouth bed as needed anxiety  #30 x 3   Entered and Authorized by:   Karen Kays MD   Signed by:   Sheral Flow Park MD on 12/23/2010   Method used:   Print then Give to Patient   RxID:   [id number] OMEPRAZOLE 20 MG CPDR (OMEPRAZOLE) daily  #30 x 3   Entered and Authorized by:   Karen Kays MD   Signed by:   Sheral Flow Park MD on 12/23/2010   Method used:   Electronically to        [facility] [address] [phone]* (retail)       [address]       [address]       [place],   [address]       Ph: [phone]       Fax: [phone]   RxID:   [id number] FUROSEMIDE 40 MG TABS (FUROSEMIDE) one by mouth daily  #30 x 3   Entered and Authorized by:   Karen Kays MD   Signed by:   Sheral Flow Park MD on 12/23/2010   Method used:   Electronically to        [facility] [address] [phone]* (retail)       [address]       [address]       [place],   [address]       Ph: [phone]       Fax: [phone]   RxID:   [id number] [place] CD  180 MG XR24H-CAP (DILTIAZEM HCL COATED BEADS) one by mouth daily  #30 x 3   Entered and Authorized by:   Karen Kays MD   Signed by:   Sheral Flow Park MD on 12/23/2010   Method used:   Electronically to         [facility] [address] [phone]* (retail)       [address]       [address]       [place], [place]  [address]       Ph: [phone]       Fax: [phone]   RxID:   [id number] CARVEDILOL 12.5 MG TABS (CARVEDILOL) Take one tablet by mouth twice a day  #60 x 3   Entered and Authorized by:   Karen Kays MD   Signed by:   Sheral Flow Park MD on 12/23/2010   Method used:   Electronically to        [facility] [address] [phone]* (retail)       [address]       [address]       [place], [place]  [address]       Ph: [phone]       Fax: [phone]   RxID:   [id number] KLOR-CON M20 20 MEQ TBCR (POTASSIUM CHLORIDE CRYS CR) Take 1 tablet by mouth once a day  #30 x 3   Entered and Authorized by:   Karen Kays MD   Signed by:   Sheral Flow Park MD on 12/23/2010   Method used:   Electronically to        [facility] [address] [phone]* (retail)       [address]       [address]       [place], [place]  [address]       Ph: [phone]       Fax: [phone]   RxID:   [id number] CRESTOR 10 MG TABS (ROSUVASTATIN CALCIUM) 1 tablet by mouth every night  #30 x 3   Entered and Authorized by:   Karen Kays MD   Signed by:   Sheral Flow Park MD on 12/23/2010   Method used:   Electronically to        [facility] [address] [phone]* (retail)       [address]       [address]       [place],   [address]       Ph: [phone]       Fax: [phone]   RxID:   [id number]    Orders Added: 1)  Pneumococcal Vaccine [id number] 2)  Admin 1st Vaccine [id number] 3)  [place]- Est  Level 4 [id number]   Immunizations Administered:  Pneumonia Vaccine:    Vaccine Type: Pneumovax    Site: left deltoid    Mfr: Merck    Dose: 0.5 ml    Route: IM    Given by: Levert Feinstein LPN    Exp. Date: 05/08/2012    Lot #: [id number]    VIS given: 11/19/09 version given December 23, 2010.   Immunizations Administered:  Pneumonia Vaccine:    Vaccine Type: Pneumovax    Site: left  deltoid    Mfr: Merck    Dose: 0.5 ml    Route: IM    Given by: Levert Feinstein LPN    Exp. Date: 05/08/2012    Lot #: [id number]  VIS given: 11/19/09 version given December 23, 2010.

## 2011-01-16 NOTE — Medication Information (Signed)
Summary: rov/sp  Medications Added CENTRUM  TABS (MULTIPLE VITAMINS-MINERALS) 1 tablet by mouth daily       Anticoagulant Therapy  Managed by: Porfirio Oar, PharmD Referring MD: Rayann Heman PCP: Dr Sarita Haver Supervising MD: Rayann Heman MD, Jeneen Rinks Indication 1: Atrial Fibrillation Lab Used: [facility] [place] Site: [place] INR POC 2.8 INR RANGE 2.0-3.0  Dietary changes: no    Health status changes: no    Bleeding/hemorrhagic complications: no    Recent/future hospitalizations: no    Any changes in medication regimen? no    Recent/future dental: no  Any missed doses?: no       Is patient compliant with meds? yes       Allergies: 1)  ! Ace Inhibitors  Anticoagulation Management History:      The patient is taking warfarin and comes in today for a routine follow up visit.  Positive risk factors for bleeding include an age of 67 years or older.  The bleeding index is 'intermediate risk'.  Positive CHADS2 values include History of CHF and History of HTN.  Negative CHADS2 values include Age > 5 years old.  Anticoagulation responsible provider: Bruna Dills MD, Jeneen Rinks.  INR POC: 2.8.  Cuvette Lot#: [id number].  Exp: 01/2012.    Anticoagulation Management Assessment/Plan:      The patient's current anticoagulation dose is Warfarin sodium 5 mg tabs: Use as directed by Anticoagulation Clinic.  The target INR is 2.0-3.0.  The next INR is due 01/01/2011.  Anticoagulation instructions were given to patient.  Results were reviewed/authorized by Porfirio Oar, PharmD.  She was notified by [name], PharmD Candidate .         Prior Anticoagulation Instructions: INR 1.8  Increase Coumadin to 1 tablet every day except 1 1/2 tablets on Wednesday and Saturday.  Recheck INR in 1 week.   Current Anticoagulation Instructions: INR 2.8  Coumadin 5 mg tablets - Continue 1 tablet every day except 1.5 tablets on Wednesdays and Saturdays

## 2011-01-16 NOTE — Medication Information (Signed)
Summary: rov/tp  Medications Added METOPROLOL TARTRATE 50 MG TABS (METOPROLOL TARTRATE) Take 1 tablet by mouth twice daily       Anticoagulant Therapy  Managed by: Vanessa Alton, PharmD Referring MD: Rayann Heman PCP: Karen Kays MD Supervising MD: Verl Blalock MD, Marcello Moores Indication 1: Atrial Fibrillation Lab Used: [facility] Site: [place] INR POC 5.9 INR RANGE 2.0-3.0  Dietary changes: no    Health status changes: no    Bleeding/hemorrhagic complications: no    Recent/future hospitalizations: no    Any changes in medication regimen? yes       Details: Added Lopressor 50 mg po twice daily  Recent/future dental: no  Any missed doses?: no       Is patient compliant with meds? yes       Allergies: 1)  ! Ace Inhibitors  Anticoagulation Management History:      The patient is taking warfarin and comes in today for a routine follow up visit.  Positive risk factors for bleeding include an age of 65 years or older.  The bleeding index is 'intermediate risk'.  Positive CHADS2 values include History of CHF and History of HTN.  Negative CHADS2 values include Age > 67 years old.  Anticoagulation responsible provider: Verl Blalock MD, Marcello Moores.  INR POC: 5.9.  Cuvette Lot#: [id number].  Exp: 12/2011.    Anticoagulation Management Assessment/Plan:      The patient's current anticoagulation dose is Warfarin sodium 5 mg tabs: Use as directed by Anticoagulation Clinic.  The target INR is 2.0-3.0.  The next INR is due 01/13/2011.  Anticoagulation instructions were given to patient.  Results were reviewed/authorized by Vanessa Vance, PharmD.         Prior Anticoagulation Instructions: INR 2.8  Coumadin 5 mg tablets - Continue 1 tablet every day except 1.5 tablets on Wednesdays and Saturdays   Current Anticoagulation Instructions: INR: 5.9  (goal 2-3)  Your INR is high today.  Hold your warfarin for the next 3 days (Monday, Tuesday, Wednesday).  Restart warfarin on Thursday  with 1 tablet, Friday 1 tablet, Saturday 1.5 tablet, Sunday 1 tablet.  Come back next Monday for another INR check.   Prescriptions: METOPROLOL TARTRATE 50 MG TABS (METOPROLOL TARTRATE) Take 1 tablet by mouth twice daily  #1 x 1   Entered by:   Porfirio Oar PharmD   Authorized by:   [name] [facility]   Signed by:   Porfirio Oar PharmD on 01/06/2011   Method used:   Historical   RxID:   [id number]

## 2011-01-16 NOTE — Cardiovascular Report (Signed)
Summary: Office Visit   Office Visit   Imported By: Sallee Provencal 01/06/2011 11:03:11  _____________________________________________________________________  External Attachment:    Type:   Image     Comment:   External Document

## 2011-01-16 NOTE — Procedures (Signed)
Summary: DEVICE CHECK      Allergies Added:   Current Medications (verified): 1)  Crestor 10 Mg Tabs (Rosuvastatin Calcium) .[name].. 1 Tablet By Mouth Every Night 2)  Klor-Con M20 20 Meq Tbcr (Potassium Chloride Crys Cr) .... Take 1 Tablet By Mouth Once A Day 3)  Xanax 0.5 Mg Tabs (Alprazolam) .... One By Mouth Bed As Needed Anxiety 4)  Tylenol Extra Strength 325 Mg  Tabs (Acetaminophen) .... Take One Tablet Every Six Hours For Belly/side Pain 5)  Proair Hfa 108 (90 Base) Mcg/act  Aers (Albuterol Sulfate) .... Two Puffs Every 4-6 Hours As Needed 6)  Carvedilol 12.5 Mg Tabs (Carvedilol) .... Take One Tablet By Mouth Twice A Day 7)  Cardizem Cd 180 Mg Xr24h-Cap (Diltiazem Hcl Coated Beads) .... One By Mouth Daily 8)  Furosemide 40 Mg Tabs (Furosemide) .... One By Mouth Daily 9)  Otc Laxatives .... As Needed 10)  Aspirin 81 Mg Tbec (Aspirin) .... Take 1 Tablet By Mouth Once Daily 11)  Omeprazole 20 Mg Cpdr (Omeprazole) .... Daily 12)  Flovent Hfa 44 Mcg/act Aero (Fluticasone Propionate  Hfa) .... As Directed 13)  Pacerone 400 Mg Tabs (Amiodarone Hcl) .... Take 1 Tablet Daily. 14)  Warfarin Sodium 5 Mg Tabs (Warfarin Sodium) .... Use As Directed By Anticoagulation Clinic  Allergies (verified): 1)  ! Ace Inhibitors  PPM Specifications Following MD:  Thompson Grayer, MD     PPM Vendor:  St Jude     PPM Model Number:  [id number]     PPM Serial Number:  [id number] PPM DOI:  11/17/2003     PPM Implanting MD:  Vanna Scotland. [name], MD  Lead 1    Location: [facility]     DOI: 07/08/1987     Model #: [id number]     Serial #: [id number]     Status: active Lead 2    Location: [facility]     DOI: 07/08/1987     Model #: [id number]     Serial #: [id number]     Status: active   Indications:  Complete Heart Block   PPM Follow Up Remote Check?  No Battery Voltage:  2.60 V     Battery Est. Longevity:  0.25years     Pacer Dependent:  Yes      Episodes Coumadin:  No  Parameters Mode:  DDDR     Lower Rate Limit:  70     Upper Rate Limit:  120 Paced  AV Delay:  170     Sensed AV Delay:  150 Tech Comments:  Battery check only.  ROV 01/06/11 with Dr. Rayann Heman.

## 2011-01-16 NOTE — Medication Information (Signed)
Summary: needs to be seen in 2 weeks to transition off pradaxa and on ...  Medications Added WARFARIN SODIUM 5 MG TABS (WARFARIN SODIUM) Use as directed by Anticoagulation Clinic       Anticoagulant Therapy  Managed by: Porfirio Oar, PharmD Referring MD: Rayann Heman PCP: Dr Sarita Haver Supervising MD: Stanford Breed MD, Aaron Edelman Indication 1: Atrial Fibrillation Lab Used: [facility] Site: [place] INR RANGE 2.0-3.0          Comments: Pt was originially started on Pradaxa.  Unable to afford so plan to switch to Coumadin.  She has about 2 days left of Pradaxa samples.  Discussed bleeding risks, dietary concerns, and medication interactions surrounding Coumadin.  Did not check INR today due to possible interaction with Pradaxa and inaccurate reading.   Current Medications (verified): 1)  Crestor 10 Mg Tabs (Rosuvastatin Calcium) .[name].. 1 Tablet By Mouth Every Night 2)  Klor-Con M20 20 Meq Tbcr (Potassium Chloride Crys Cr) .... Take 1 Tablet By Mouth Once A Day 3)  Xanax 0.5 Mg Tabs (Alprazolam) .... One By Mouth Bed As Needed Anxiety 4)  Tylenol Extra Strength 325 Mg  Tabs (Acetaminophen) .... Take One Tablet Every Six Hours For Belly/side Pain 5)  Proair Hfa 108 (90 Base) Mcg/act  Aers (Albuterol Sulfate) .... Two Puffs Every 4-6 Hours As Needed 6)  Carvedilol 12.5 Mg Tabs (Carvedilol) .... Take One Tablet By Mouth Twice A Day 7)  Cardizem Cd 180 Mg Xr24h-Cap (Diltiazem Hcl Coated Beads) .... One By Mouth Daily 8)  Furosemide 40 Mg Tabs (Furosemide) .... One By Mouth Daily 9)  Otc Laxatives .... As Needed 10)  Aspirin 81 Mg Tbec (Aspirin) .... Take 1 Tablet By Mouth Once Daily 11)  Omeprazole 20 Mg Cpdr (Omeprazole) .... Daily 12)  Flovent Hfa 44 Mcg/act Aero (Fluticasone Propionate  Hfa) .... As Directed 13)  Amiodarone Hcl 200 Mg Tabs (Amiodarone Hcl) .... Take One Tablet By Mouth Daily 14)  Warfarin Sodium 5 Mg Tabs (Warfarin Sodium) .... Use As Directed By  Anticoagulation Clinic  Allergies: 1)  ! Ace Inhibitors  Anticoagulation Management History:      The patient comes in today for her initial visit for anticoagulation therapy.  Positive risk factors for bleeding include an age of 67 years or older.  The bleeding index is 'intermediate risk'.  Positive CHADS2 values include History of CHF and History of HTN.  Negative CHADS2 values include Age > 57 years old.  Anticoagulation responsible provider: Stanford Breed MD, Aaron Edelman.    Anticoagulation Management Assessment/Plan:      The patient's current anticoagulation dose is Warfarin sodium 5 mg tabs: Use as directed by Anticoagulation Clinic.  The target INR is 2.0-3.0.  The next INR is due 12/18/2010.  Anticoagulation instructions were given to patient.  Results were reviewed/authorized by Porfirio Oar, PharmD.  She was notified by Maryanna Shape PharmD.         Current Anticoagulation Instructions: Continue Pradaxa until you are out of samples.  Start Coumadin 5mg  once a day today.  Recheck INR in 1 week.

## 2011-01-16 NOTE — Procedures (Signed)
Summary: device check/sl    Allergies: 1)  ! Ace Inhibitors  PPM Specifications Following MD:  Vanna Scotland. [name], MD     PPM Vendor:  St Jude     PPM Model Number:  [id number]     PPM Serial Number:  [id number] PPM DOI:  11/17/2003     PPM Implanting MD:  Vanna Scotland. [name], MD  Lead 1    Location: [facility]     DOI: 07/08/1987     Model #: [id number]     Serial #: [id number]     Status: active Lead 2    Location: [facility]     DOI: 07/08/1987     Model #: [id number]     Serial #: [id number]     Status: active   Indications:  Complete Heart Block   PPM Follow Up Battery Voltage:  2.64 V     Battery Est. Longevity:  0.25 YR     Pacer Dependent:  Yes       PPM Device Measurements Atrium  Amplitude: PACED mV,   Episodes Coumadin:  No  Parameters Mode:  DDDR     Lower Rate Limit:  70     Upper Rate Limit:  120 Paced AV Delay:  170     Sensed AV Delay:  150 Tech Comments:  INTERROGATION ONLY--CHECKED BY INDUSTRY.  BATTERY LONGEVITY 0.25 YR.  ROV IN 1 MTH FOR DEVICE CHECK AND ROV IN 6 WKS W/JA. Shelly Bombard  November 28, 2010 4:06 PM

## 2011-01-16 NOTE — Letter (Signed)
Summary: Generic Letter  [facility]  [address]   [place], [place] [address]   Phone: [phone]  Fax: [phone]    01/09/2011  [place], [place]  [address]  Dear Ms. Hamblin-POLK,  We are happy to let you know that since you are covered under Medicare you are able to have a FREE visit at the [facility] to discuss your HEALTH. This is a new benefit for Medicare.  There will be no co-payment.  At this visit you will meet with Lamont Dowdy an expert in wellness and the health coach at our clinic.  At this visit we will discuss ways to keep you healthy and feeling well.  This visit will not replace your regular doctor visit and we cannot refill medications.     You will need to plan to be here at least one hour to talk about your medical history, your current status, review all of your medications, and discuss your future plans for your health.  This information will be entered into your record for your doctor to have and review.  If you are interested in staying healthy, this type of visit can help.  Please call the office at: [phone], to schedule a "Medicare Wellness Visit".  The day of the visit you should bring in all of your medications, including any vitamins, herbs, over the counter products you take.  Make a list of all the other doctors that you see, so we know who they are. If you have any other health documents please bring them.  We look forward to helping you stay healthy.  Sincerely,   Suzanne Lineberry [place]

## 2011-01-16 NOTE — Assessment & Plan Note (Signed)
Summary: FORMER PATIENT OF [facility] WITH PACEMAKER/SL  Medications Added AMIODARONE HCL 200 MG TABS (AMIODARONE HCL) Take one tablet by mouth daily        Referring Provider:  Dr Eustace Quail Primary Provider:  Karen Kays MD   History of Present Illness: The patient presents today for routine electrophysiology followup. She reports doing very well since last being seen by Dr [name]. The patient denies symptoms of palpitations, chest pain, shortness of breath, orthopnea, PND, lower extremity edema, dizziness, presyncope, syncope, or neurologic sequela. The patient is tolerating medications without difficulties and is otherwise without complaint today.   Current Medications (verified): 1)  Crestor 10 Mg Tabs (Rosuvastatin Calcium) .[name].. 1 Tablet By Mouth Every Night 2)  Klor-Con M20 20 Meq Tbcr (Potassium Chloride Crys Cr) .... Take 1 Tablet By Mouth Once A Day 3)  Xanax 0.5 Mg Tabs (Alprazolam) .... One By Mouth Bed As Needed Anxiety 4)  Tylenol Extra Strength 325 Mg  Tabs (Acetaminophen) .... Take One Tablet Every Six Hours For Belly/side Pain 5)  Proair Hfa 108 (90 Base) Mcg/act  Aers (Albuterol Sulfate) .... Two Puffs Every 4-6 Hours As Needed 6)  Carvedilol 12.5 Mg Tabs (Carvedilol) .... Take One Tablet By Mouth Twice A Day 7)  Cardizem Cd 180 Mg Xr24h-Cap (Diltiazem Hcl Coated Beads) .... One By Mouth Daily 8)  Furosemide 40 Mg Tabs (Furosemide) .... One By Mouth Daily 9)  Aspirin 81 Mg Tbec (Aspirin) .... Take 1 Tablet By Mouth Once Daily 10)  Omeprazole 20 Mg Cpdr (Omeprazole) .... Daily 11)  Flovent Hfa 44 Mcg/act Aero (Fluticasone Propionate  Hfa) .... As Directed 12)  Pacerone 400 Mg Tabs (Amiodarone Hcl) .... Take 1 Tablet Daily. 13)  Warfarin Sodium 5 Mg Tabs (Warfarin Sodium) .... Use As Directed By Anticoagulation Clinic 14)  Centrum  Tabs (Multiple Vitamins-Minerals) .[name].. 1 Tablet By Mouth Daily  Allergies: 1)  ! Ace Inhibitors  Past History:  Past Medical  History: Reviewed history from 12/23/2010 and no changes required. Chronic problems: -Panic attacks -Chronic, persistent asthma. Cardiac:  -hypertrophic cardiomyopathy (Dx 05/2008)--followed by Dr. [name] ([facility])  -diastolic heart failure (EDW 180#)    -ECHO 05/30/10: EF 55-60%; LV moderately dilated c increased thickness c/w moderate LVH    -Normal coronary angiography (04/2008) c LVH c/w hypertrophic CM, normal PAW 6    -Cardiolite 08/1999 --neg ischemia - 04/30/2000, s/p BTL - 04/01/2000  -s/p St. Jude DDD (dual-mode, dual-pacing, dual-sensing) pacemaker for complete AV block 1998  -atril fibrillation (Dx 10/2010)     -s/p failed cardioversion (10/2010) now on coumadin      -Pradaxa tried by Cards but stopped due to cost   Other CV:  -Hypertension.  -Hyperlipidemia. -Continued cigarette use.  -h/o dyspepsia   Past problems: -h/o angioedema due to ACEI -h/o RLS (Dx 06/2007)  Past Surgical History: Reviewed history from 07/23/2010 and no changes required. Cardiac cath 5/09: normal angiography with LVH c/w hypertrophic CM. Normal PAW of 6.   Cardiolite 9/00 - neg ischemia - 04/30/2000,  s/p BTL - 04/01/2000,  s/p pacemaker placement -1998   echo 05/30/10: Left ventricle: The cavity size was moderately dilated. Wall     thickness was increased in a pattern of moderate LVH. Systolic     function was normal. The estimated ejection fraction was in the     range of 55% to 60%.  Social History: Reviewed history from 12/23/2010 and no changes required. On disability for heart failure/pacemaker.   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  4 grown children,  8 grandchildren.   Drinks alcohol a few times a week socially. At one time, the most she reports drinking is about 3 mixed drinks. Lives with daughter [name]. No illicit drugs. Smokes few cigarettes daily, especially when stressed.   Review of Systems       All systems are reviewed and negative except  as listed in the HPI.   Vital Signs:  Patient profile:   65 year old female Height:      65 inches Weight:      180 pounds Pulse rate:   77 / minute BP sitting:   104 / 60  (left arm)  Vitals Entered By: Margaretmary Bayley CMA (January 06, 2011 10:47 AM)  Physical Exam  General:  Well developed, well nourished, in no acute distress. Head:  normocephalic and atraumatic Eyes:  PERRLA/EOM intact; conjunctiva and lids normal. Mouth:  Teeth, gums and palate normal. Oral mucosa normal. Neck:  supple Chest Wall:  R sided pacemaker is well healed Lungs:  Clear bilaterally to auscultation and percussion. Heart:  RRR, no m/r/g Abdomen:  Bowel sounds positive; abdomen soft and non-tender without masses, organomegaly, or hernias noted. No hepatosplenomegaly. Msk:  Back normal, normal gait. Muscle strength and tone normal. Extremities:  No clubbing or cyanosis. Neurologic:  Alert and oriented x 3. Skin:  Intact without lesions or rashes.   PPM Specifications Following MD:  Thompson Grayer, MD     PPM Vendor:  St Jude     PPM Model Number:  [id number]     PPM Serial Number:  [id number] DOI:  11/17/2003     PPM Implanting MD:  Vanna Scotland. [name], MD  Lead 1    Location: [facility]     DOI: 07/08/1987     Model #: [id number]     Serial #: [id number]     Status: active Lead 2    Location: [facility]     DOI: 07/08/1987     Model #: [id number]     Serial #: [id number]     Status: active   Indications:  Complete Heart Block   PPM Follow Up Remote Check?  No Battery Voltage:  2.59 V     Battery Est. Longevity:  0-17months     Pacer Dependent:  Yes       PPM Device Measurements Atrium  Impedance: 404 ohms, Threshold: 1.25 V at 0.5 msec Right Ventricle  Impedance: 504 ohms, Threshold: 1.0 V at 0.5 msec  Episodes MS Episodes:  1672     Percent Mode Switch:  <1%     Coumadin:  No Atrial Pacing:  >99     Ventricular Pacing:  >99  Parameters Mode:  DDDR     Lower Rate Limit:  70     Upper Rate Limit:  120 Paced AV Delay:  170     Sensed AV  Delay:  150 Tech Comments:  No P or R waves at temp 30bpm, afib all episodes under 89mins, only 40 longer than 55min, Lorella Nimrod MD Comments:  agree  Impression & Recommendations:  Problem # 1:  PACEMAKER (ICD-V45.01) Normal pacemaker function for complete heart block she is approaching ERI.  We will see her again in 4 weeks.  Problem # 2:  ATRIAL FIBRILLATION (ICD-427.31) continue coumadin no changes today afib appears well controlled with amiodarone 200mg  daily we will check LFTs and TFTs today  Problem # 3:  HYPERTENSION, BENIGN SYSTEMIC (ICD-401.1) stable  salt restriction no changes  Problem # 4:  CHRONIC DIASTOLIC HEART FAILURE (0000000) stable  Other Orders: TLB-TSH (Thyroid Stimulating Hormone) (84443-TSH) TLB-T4 (Thyrox), Free [phone]) TLB-Hepatic/Liver Function Pnl (80076-HEPATIC)  Patient Instructions: 1)  Your physician recommends that you schedule a follow-up appointment in: 4 weeks with Dr Rayann Heman 2)  Your physician recommends that you continue on your current medications as directed. Please refer to the Current Medication list given to you today.

## 2011-01-20 ENCOUNTER — Encounter (INDEPENDENT_AMBULATORY_CARE_PROVIDER_SITE_OTHER): Payer: PRIVATE HEALTH INSURANCE

## 2011-01-20 ENCOUNTER — Encounter: Payer: Self-pay | Admitting: Cardiology

## 2011-01-20 DIAGNOSIS — Z7901 Long term (current) use of anticoagulants: Secondary | ICD-10-CM

## 2011-01-20 DIAGNOSIS — I4891 Unspecified atrial fibrillation: Secondary | ICD-10-CM

## 2011-01-22 NOTE — Medication Information (Signed)
Summary: rov/kh   Anticoagulant Therapy  Managed by: Doran Stabler PharmD Referring MD: Rayann Heman PCP: Karen Kays MD Supervising MD: Percival Spanish MD, Jeneen Rinks Indication 1: Atrial Fibrillation Lab Used: [facility] Site: [place] INR POC 3.5 INR RANGE 2.0-3.0  Dietary changes: yes       Details: Tries to keep consistent but varies. Patient has been drinking lots of cranberry juice.   Health status changes: no    Bleeding/hemorrhagic complications: yes       Details: stool is darker than normal, the patient says that it looks black to her  Recent/future hospitalizations: no    Any changes in medication regimen? yes       Details: Pt. started on centrum. Pt is to check and see if has vitmain K in th particular product  Recent/future dental: no  Any missed doses?: no       Is patient compliant with meds? yes       Allergies: 1)  ! Ace Inhibitors  Anticoagulation Management History:      The patient is taking warfarin and comes in today for a routine follow up visit.  Positive risk factors for bleeding include an age of 67 years or older.  The bleeding index is 'intermediate risk'.  Positive CHADS2 values include History of CHF and History of HTN.  Negative CHADS2 values include Age > 44 years old.  Anticoagulation responsible provider: Percival Spanish MD, Jeneen Rinks.  INR POC: 3.5.  Cuvette Lot#: [id number].  Exp: 12/2011.    Anticoagulation Management Assessment/Plan:      The patient's current anticoagulation dose is Warfarin sodium 5 mg tabs: Use as directed by Anticoagulation Clinic.  The target INR is 2.0-3.0.  The next INR is due 01/20/2011.  Anticoagulation instructions were given to patient.  Results were reviewed/authorized by Doran Stabler PharmD.         Prior Anticoagulation Instructions: INR: 5.9  (goal 2-3)  Your INR is high today.  Hold your warfarin for the next 3 days (Monday, Tuesday, Wednesday).  Restart warfarin on Thursday with 1 tablet,  Friday 1 tablet, Saturday 1.5 tablet, Sunday 1 tablet.  Come back next Monday for another INR check.    Current Anticoagulation Instructions: INR 3.5  INR is high today. Hold coumadin dose today. Then take 1 tablet daily except for 1.5 tablets on Saturday. Recheck INR in 1 week.

## 2011-01-28 ENCOUNTER — Telehealth: Payer: Self-pay | Admitting: Internal Medicine

## 2011-01-30 NOTE — Medication Information (Signed)
Summary: Coumadin Clinic   Anticoagulant Therapy  Managed by: Porfirio Oar, PharmD Referring MD: Rayann Heman PCP: Karen Kays MD Supervising MD: Stanford Breed MD, Aaron Edelman Indication 1: Atrial Fibrillation Lab Used: [facility] Site: [place] INR POC 2.6 INR RANGE 2.0-3.0  Dietary changes: no    Health status changes: no    Bleeding/hemorrhagic complications: no    Recent/future hospitalizations: no    Any changes in medication regimen? no    Recent/future dental: no  Any missed doses?: yes     Details: Did not take any Saturday (01/18/11) or Sunday (01/19/11)  Is patient compliant with meds? no     Details: She thinks the Coumadin is causing her shortness of breath   Allergies: 1)  ! Ace Inhibitors  Anticoagulation Management History:      The patient is taking warfarin and comes in today for a routine follow up visit.  Positive risk factors for bleeding include an age of 67 years or older.  The bleeding index is 'intermediate risk'.  Positive CHADS2 values include History of CHF and History of HTN.  Negative CHADS2 values include Age > 44 years old.  Anticoagulation responsible provider: Stanford Breed MD, Aaron Edelman.  INR POC: 2.6.  Cuvette Lot#: [id number].  Exp: 12/2011.    Anticoagulation Management Assessment/Plan:      The patient's current anticoagulation dose is Warfarin sodium 5 mg tabs: Use as directed by Anticoagulation Clinic.  The target INR is 2.0-3.0.  The next INR is due 02/06/2011.  Anticoagulation instructions were given to patient.  Results were reviewed/authorized by Porfirio Oar, PharmD.  She was notified by Mliss Sax PharmD Candidate .         Prior Anticoagulation Instructions: INR 3.5  INR is high today. Hold coumadin dose today. Then take 1 tablet daily except for 1.5 tablets on Saturday. Recheck INR in 1 week.  Current Anticoagulation Instructions: INR 2.6   Continue current Coumadin regimen of 1 tablet everyday except 1 1/2 tablets on  Saturday.

## 2011-02-04 ENCOUNTER — Ambulatory Visit: Payer: PRIVATE HEALTH INSURANCE | Admitting: Home Health Services

## 2011-02-05 NOTE — Progress Notes (Signed)
Summary: Triage  Medications Added ALIGN  CAPS (PROBIOTIC PRODUCT) Take one daily SENOKOT 8.6 MG TABS (SENNOSIDES) Take 2 tabs at bedtime       Phone Note Call from Patient Call back at Home Phone [phone]   Caller: Patient Call For: Dr. [name] Reason for Call: Talk to Nurse Summary of Call: Since having her last COL she has not had a regular BM and experiencing bloating Initial call taken by: Webb Laws,  January 28, 2011 12:02 PM  Follow-up for Phone Call        Spoke with patient and she states that she had to quit work about 2-3 weeks before Christmas. She has been having heart problems and they have been changing her medications a lot. She has started feeling like"my bowels are not coming down like it should." States she does not have a bowel movement unless she takes something. She did enemas but it did not help. She has been using suppositories but these do not help much either. Miralax gave her gas but did not get her bowels going. States she feels bloated when she eats or drinks water. She is taking Omeprazole 1/2 hour before she eats but it makes her feel bloated too.Last colon- 08-13-10- polyps; tubular adenoma, suspect IBS, EGD-08-13-10. Please, advise Follow-up by: Leone Payor RN,  January 28, 2011 2:09 PM  Additional Follow-up for Phone Call Additional follow up Details #1::        Start Probiotic  Align 1 by mouth once daily, ,Senokott, 2 by mouth at bedtime. She needs an appointment, too complex to solve over the phone. Additional Follow-up by: [name] MD,  January 28, 2011 11:38 PM    Additional Follow-up for Phone Call Additional follow up Details #2::    Spoke with patient and she wants me to call her back this afternoon so her daughter can write down her instructions. Follow-up by: Leone Payor RN,  January 29, 2011 9:12 AM  Additional Follow-up for Phone Call Additional follow up Details #3:: Details for Additional Follow-up Action Taken:  Spoke with patient's daughter and gave her Dr. Nichola Sizer recommendations. Scheduled patient to see MD on 02/10/11 at 3 PM. Additional Follow-up by: Leone Payor RN,  January 29, 2011 2:22 PM  New/Updated Medications: ALIGN  CAPS (PROBIOTIC PRODUCT) Take one daily SENOKOT 8.6 MG TABS (SENNOSIDES) Take 2 tabs at bedtime

## 2011-02-06 ENCOUNTER — Encounter: Payer: Self-pay | Admitting: Family Medicine

## 2011-02-06 ENCOUNTER — Emergency Department (HOSPITAL_COMMUNITY): Payer: PRIVATE HEALTH INSURANCE

## 2011-02-06 ENCOUNTER — Inpatient Hospital Stay (HOSPITAL_COMMUNITY)
Admission: EM | Admit: 2011-02-06 | Discharge: 2011-02-09 | DRG: 812 | Disposition: A | Discharge status: Home / Self Care | Payer: PRIVATE HEALTH INSURANCE | Attending: Family Medicine | Admitting: Family Medicine

## 2011-02-06 ENCOUNTER — Ambulatory Visit: Payer: Self-pay | Admitting: Internal Medicine

## 2011-02-06 DIAGNOSIS — N189 Chronic kidney disease, unspecified: Secondary | ICD-10-CM | POA: Diagnosis present

## 2011-02-06 DIAGNOSIS — I509 Heart failure, unspecified: Secondary | ICD-10-CM | POA: Diagnosis present

## 2011-02-06 DIAGNOSIS — I4891 Unspecified atrial fibrillation: Secondary | ICD-10-CM | POA: Diagnosis present

## 2011-02-06 DIAGNOSIS — K31819 Angiodysplasia of stomach and duodenum without bleeding: Secondary | ICD-10-CM | POA: Diagnosis present

## 2011-02-06 DIAGNOSIS — I129 Hypertensive chronic kidney disease with stage 1 through stage 4 chronic kidney disease, or unspecified chronic kidney disease: Secondary | ICD-10-CM | POA: Diagnosis present

## 2011-02-06 DIAGNOSIS — D62 Acute posthemorrhagic anemia: Principal | ICD-10-CM | POA: Diagnosis present

## 2011-02-06 DIAGNOSIS — N179 Acute kidney failure, unspecified: Secondary | ICD-10-CM | POA: Diagnosis present

## 2011-02-06 DIAGNOSIS — E875 Hyperkalemia: Secondary | ICD-10-CM | POA: Diagnosis present

## 2011-02-06 DIAGNOSIS — I5030 Unspecified diastolic (congestive) heart failure: Secondary | ICD-10-CM | POA: Diagnosis present

## 2011-02-06 DIAGNOSIS — E785 Hyperlipidemia, unspecified: Secondary | ICD-10-CM | POA: Diagnosis present

## 2011-02-06 DIAGNOSIS — Z7982 Long term (current) use of aspirin: Secondary | ICD-10-CM

## 2011-02-06 LAB — COMPREHENSIVE METABOLIC PANEL
ALT: 15 U/L (ref 0–35)
AST: 20 U/L (ref 0–37)
Alkaline Phosphatase: 43 U/L (ref 39–117)
CO2: 22 mEq/L (ref 19–32)
GFR calc Af Amer: 11 mL/min — ABNORMAL LOW (ref >60)
Glucose, Bld: 111 mg/dL — ABNORMAL HIGH (ref 70–99)
Potassium: 6.5 mEq/L (ref 3.5–5.1)
Sodium: 137 mEq/L (ref 135–145)
Total Protein: 6.5 g/dL (ref 6.0–8.3)

## 2011-02-06 LAB — URINALYSIS, ROUTINE W REFLEX MICROSCOPIC
Hgb urine dipstick: NEGATIVE
Protein, ur: NEGATIVE mg/dL
Urine Glucose, Fasting: NEGATIVE mg/dL
pH: 5.5 (ref 5.0–8.0)

## 2011-02-06 LAB — SODIUM, URINE, RANDOM: Sodium, Ur: 16 mEq/L

## 2011-02-06 LAB — DIFFERENTIAL
Lymphocytes Relative: 23 % (ref 12–46)
Monocytes Absolute: 0.6 10*3/uL (ref 0.1–1.0)
Monocytes Relative: 15 % — ABNORMAL HIGH (ref 3–12)
Neutro Abs: 2.3 10*3/uL (ref 1.7–7.7)

## 2011-02-06 LAB — URINE MICROSCOPIC-ADD ON

## 2011-02-06 LAB — BRAIN NATRIURETIC PEPTIDE: Pro B Natriuretic peptide (BNP): 159 pg/mL — ABNORMAL HIGH (ref 0.0–100.0)

## 2011-02-06 LAB — CBC
HCT: 17.8 % — ABNORMAL LOW (ref 36.0–46.0)
Hemoglobin: 5.2 g/dL — CL (ref 12.0–15.0)
MCH: 24.4 pg — ABNORMAL LOW (ref 26.0–34.0)
MCHC: 29.2 g/dL — ABNORMAL LOW (ref 30.0–36.0)
RBC: 2.13 MIL/uL — ABNORMAL LOW (ref 3.87–5.11)

## 2011-02-06 LAB — LIPASE, BLOOD: Lipase: 39 U/L (ref 11–59)

## 2011-02-06 LAB — CREATININE, URINE, RANDOM: Creatinine, Urine: 295.9 mg/dL

## 2011-02-06 LAB — RAPID URINE DRUG SCREEN, HOSP PERFORMED
Barbiturates: NOT DETECTED
Benzodiazepines: POSITIVE — AB

## 2011-02-06 LAB — ABO/RH: ABO/RH(D): O POS

## 2011-02-06 LAB — PROTIME-INR: INR: 1.39 (ref 0.00–1.49)

## 2011-02-06 NOTE — H&P (Signed)
[facility] Admission History and Physical  Patient name: [name] Medical record number: [id number] Date of birth: 1946/08/26 Age: 65 y.o. Gender: female  Primary Care Provider: Karen Kays, MD  Chief Complaint: Shortness of breath History of Present Illness: Pt presents complaining of shortness of breath and fatigue that has been going on for about two weeks.  She says that she has to stop and rest when she walks from the living room to her bedroom.  She denies chest pain associated with the shortness of breath.  Pt denies fever or chills, but endorses a cough.    Pt also complains of bloating every time she eats and constipation.  She says she has not had regular bowel movements and says she has had dark black stools and also blood on the tissue when she wipes.   Pt denies any urinary symptoms.   Patient Active Problem List  Diagnoses  . HYPERLIPIDEMIA  . ANXIETY  . DEPRESSION  . HYPERTENSION, BENIGN SYSTEMIC  . ATRIAL FIBRILLATION  . CHRONIC DIASTOLIC HEART FAILURE  . CHRONIC KIDNEY DISEASE STAGE III (MODERATE)  . ARTHRITIS  . APNEA, SLEEP  . Long term current use of anticoagulant  . OTHER DISORDERS SYNOVIUM TENDON AND BURSA OTHER  . PACEMAKER   Past Medical History: Past Medical History  Diagnosis Date  . Asthma, persistent   . Hypertrophic obstructive cardiomyopathy with diastolic heart failure   . S/P biventricular cardiac pacemaker procedure   . Chronic atrial fibrillation   . Hypertension   . Hyperlipidemia   . Tobacco dependence     Past Surgical History: Tubal ligation.  No past surgical history on file. family history includes Cancer in her brother; Diabetes in her brother and sister; Hypertension in her brother, father, mother, and sister; and Stroke in her father and mother.  Social History: History   Social History  . Marital Status: Divorced    Spouse Name: N/A    Number of Children: N/A  . Years of  Education: N/A   Social History Main Topics  . Smoking status: Current Everyday Smoker -- 0.3 packs/day    Types: Cigarettes  . Smokeless tobacco: Not on file  . Alcohol Use: 1.5 oz/week    3 drink(s) per week  . Drug Use: No  . Sexually Active: Not on file   Other Topics Concern  . Not on file   Social History Narrative   On disability for heart failure/pacemaker.  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 4 grown children,  8 grandchildren.  Drinks alcohol a few times a week socially. At one time, the most she reports drinking is about 3 mixed drinks.Lives with daughter [name].No illicit drugs.Smokes few cigarettes daily, especially when stressed.     Allergies: Allergies  Allergen Reactions  . Ace Inhibitors     REACTION: angioedema    Current Outpatient Prescriptions  Medication Sig Dispense Refill  . acetaminophen (TYLENOL) 325 MG tablet Extra strength tabs. Take 1 tab every 6 hours for pain       . albuterol (PROAIR HFA) 108 (90 BASE) MCG/ACT inhaler two puffs every 4-6 hours as needed       . ALPRAZolam (XANAX) 0.5 MG tablet Take 0.5 mg by mouth at bedtime as needed. For anxiety       . amiodarone (PACERONE) 200 MG tablet Take 200 mg by mouth daily.        [name] aspirin 81 MG EC tablet Take 81 mg by mouth  daily.        . carvedilol (COREG) 12.5 MG tablet Take 12.5 mg by mouth 2 (two) times daily.        [name] diltiazem (CARDIZEM CD) 180 MG 24 hr capsule Take 180 mg by mouth daily.        . fluticasone (FLOVENT HFA) 44 MCG/ACT inhaler as directed.        . furosemide (LASIX) 40 MG tablet Take 40 mg by mouth daily.        . Multiple Vitamins-Minerals (CENTRUM) tablet Take 1 tablet by mouth daily.        [name] omeprazole (PRILOSEC) 20 MG capsule Take 20 mg by mouth daily.        . potassium chloride SA (KLOR-CON M20) 20 MEQ tablet Take 20 mEq by mouth daily.        . rosuvastatin (CRESTOR) 10 MG tablet Take 10 mg by mouth every evening.        . warfarin (COUMADIN) 5 MG  tablet Use as directed by Anticoagulation Clinic        Review Of Systems: Per HPI.  Otherwise 12 point review of systems was performed and was unremarkable.  Physical Exam: T 98.1  Pulse 70  BP 126/80  RR 15  POx >100% RA General: NAD, interactive and appropriate HEENT: PERRL, EOMI, pharynx non-erythematous, mucous membranes pale and dry Heart: regular rate, no murmurs Lungs: Normal respiratory effort. Lungs CTABL, no crackles or wheezes. Abdomen: SNTND, no guarding  Extremities: negative edema, ROM grossly preserved Skin: No sores or suspicious lesions or rashes or color changes Neurology: Alert and oriented, CN II-XII grossly intact Rectal: Normal tone, no impaction, no gross blood.   Labs:   Chemistry      Component Value Date/Time   NA 137 02/06/2011 1111   K 6.5 NO VISIBLE HEMOLYSIS REPEATED TO VERIFY CRITICAL RESULT CALLED TO, READ BACK BY AND VERIFIED WITH: MONICA YOUNG,RN AT 1225 02/06/11 BY ZPERRY.* 02/06/2011 1111   CL 106 02/06/2011 1111   CO2 22 02/06/2011 1111   BUN 46* 02/06/2011 1111   CREATININE 4.69* 02/06/2011 1111  Glucose 111    Component Value Date/Time   CALCIUM 9.2 02/06/2011 1111   ALKPHOS 43 02/06/2011 1111   AST 20 02/06/2011 1111   ALT 15 02/06/2011 1111   BILITOT 0.6 02/06/2011 1111     Lab Results  Component Value Date   WBC 3.9* 02/06/2011   HGB 5.2 CRITICAL RESULT CALLED TO, READ BACK BY AND VERIFIED WITH: Roe Coombs AT 1206 02/06/11 BY K BARR* 02/06/2011   HCT 17.8* 02/06/2011   MCV 83.6 02/06/2011   PLT 294 02/06/2011   Lab Results  Component Value Date   INR 1.39 02/06/2011   Lab Results  Component Value Date   LIPASE 39 02/06/2011   BNP: 133.0 Lactic Acid: 1.0  Heme Occult: Positive  UA: + Leuks, Micro: Few Epi, 21-50 WBC's, Few bacteria.   EKG: Paced, regular rate  CXR: Cardiomegaly and increased pulmonary vasculature, unchanged.   Assessment and Plan: 65 year old female who presents with Shortness of breath, found to have hemoglobin  of 5. 2 and Creatinine of 4. 69: 1. Shortness of breath: Pt with normal pulmonary exam and imaging.  This is most likely due to Anemia, but we will continue her home asthma medications and monitor for pulmonary edema/fluid overload.  2. Anemia- Pt with + heme occult in ED, but recent colonoscopy.  Will transfuse 2 units of blood and f/u hemoglobin.  Will consult gastroenterology for work up for GI bleed.  3. Acute on Chronic Renal failure: Pt's baseline is 1.2-1.6.  This is likely pre-renal due to dehydration but kidney function may also be worsening contributing to anemia.  Will hydrate pt overnight and monitor her creatinine, consider renal consult if it does not improve.  4. Cardiovascular- pt with history of pacemaker, atrial fibrillation, diastolic heart failure, and hypertension.  Will continue home meds, Amiodarone, Carevediolol, Diltiazem, and Lasix.  Will hold coumadin and asprin due to anemia and likely bleeding. Pt does not appear to be in a heart failure exacerbation and is not in atrial fibrillation at this time, so we will hold on any further cardiac imaging.  5. HLD- continue home statin 6. FEN/GI:Will hydrate with NS@100cc /hr, and make NPO as she may require procedure.  Will give kayexalate for elevated potassium.  Continue home PPI for GI PPX 7. Prophylaxis: Will give SCD's for DVT PPX due to bleeding 8. Disposition: Pending clinical improvement.

## 2011-02-07 ENCOUNTER — Encounter: Payer: Self-pay | Admitting: Gastroenterology

## 2011-02-07 DIAGNOSIS — K921 Melena: Secondary | ICD-10-CM

## 2011-02-07 DIAGNOSIS — D649 Anemia, unspecified: Secondary | ICD-10-CM

## 2011-02-07 DIAGNOSIS — K31819 Angiodysplasia of stomach and duodenum without bleeding: Secondary | ICD-10-CM

## 2011-02-07 LAB — CBC
HCT: 25.3 % — ABNORMAL LOW (ref 36.0–46.0)
Hemoglobin: 6.4 g/dL — CL (ref 12.0–15.0)
Hemoglobin: 7.9 g/dL — ABNORMAL LOW (ref 12.0–15.0)
MCH: 26.2 pg (ref 26.0–34.0)
MCV: 82.7 fL (ref 78.0–100.0)
Platelets: 229 10*3/uL (ref 150–400)
RBC: 2.49 MIL/uL — ABNORMAL LOW (ref 3.87–5.11)
RBC: 3.02 MIL/uL — ABNORMAL LOW (ref 3.87–5.11)
WBC: 4.3 10*3/uL (ref 4.0–10.5)

## 2011-02-07 LAB — HAPTOGLOBIN: Haptoglobin: 213 mg/dL — ABNORMAL HIGH (ref 16–200)

## 2011-02-07 LAB — BASIC METABOLIC PANEL
CO2: 22 mEq/L (ref 19–32)
CO2: 23 mEq/L (ref 19–32)
Chloride: 109 mEq/L (ref 96–112)
Chloride: 107 mEq/L (ref 96–112)
Creatinine, Ser: 3.38 mg/dL — ABNORMAL HIGH (ref 0.4–1.2)
GFR calc Af Amer: 16 mL/min — ABNORMAL LOW (ref >60)
GFR calc Af Amer: 24 mL/min — ABNORMAL LOW (ref >60)
Glucose, Bld: 138 mg/dL — ABNORMAL HIGH (ref 70–99)
Sodium: 138 mEq/L (ref 135–145)
Sodium: 139 mEq/L (ref 135–145)

## 2011-02-08 ENCOUNTER — Encounter: Payer: Self-pay | Admitting: Family Medicine

## 2011-02-08 DIAGNOSIS — R933 Abnormal findings on diagnostic imaging of other parts of digestive tract: Secondary | ICD-10-CM

## 2011-02-08 LAB — CROSSMATCH
ABO/RH(D): O POS
Antibody Screen: NEGATIVE
Unit division: 0

## 2011-02-08 LAB — CBC
HCT: 24 % — ABNORMAL LOW (ref 36.0–46.0)
Hemoglobin: 7.4 g/dL — ABNORMAL LOW (ref 12.0–15.0)
MCH: 25.9 pg — ABNORMAL LOW (ref 26.0–34.0)
MCHC: 30.8 g/dL (ref 30.0–36.0)
MCV: 83.9 fL (ref 78.0–100.0)
RBC: 2.86 MIL/uL — ABNORMAL LOW (ref 3.87–5.11)

## 2011-02-09 LAB — CBC
HCT: 24.4 % — ABNORMAL LOW (ref 36.0–46.0)
Hemoglobin: 7.3 g/dL — ABNORMAL LOW (ref 12.0–15.0)
MCH: 25.5 pg — ABNORMAL LOW (ref 26.0–34.0)
MCHC: 29.9 g/dL — ABNORMAL LOW (ref 30.0–36.0)
MCV: 85.3 fL (ref 78.0–100.0)
Platelets: 240 K/uL (ref 150–400)
RBC: 2.86 MIL/uL — ABNORMAL LOW (ref 3.87–5.11)
RDW: 17.1 % — ABNORMAL HIGH (ref 11.5–15.5)
WBC: 4.3 K/uL (ref 4.0–10.5)

## 2011-02-09 LAB — BASIC METABOLIC PANEL WITH GFR
Chloride: 112 meq/L (ref 96–112)
GFR calc Af Amer: 43 mL/min — ABNORMAL LOW (ref >60)
GFR calc non Af Amer: 35 mL/min — ABNORMAL LOW (ref >60)
Potassium: 4.8 meq/L (ref 3.5–5.1)

## 2011-02-09 LAB — BASIC METABOLIC PANEL
BUN: 20 mg/dL (ref 6–23)
CO2: 22 mEq/L (ref 19–32)
Calcium: 8.5 mg/dL (ref 8.4–10.5)
Creatinine, Ser: 1.47 mg/dL — ABNORMAL HIGH (ref 0.4–1.2)
Glucose, Bld: 101 mg/dL — ABNORMAL HIGH (ref 70–99)
Sodium: 139 mEq/L (ref 135–145)

## 2011-02-10 ENCOUNTER — Ambulatory Visit: Payer: PRIVATE HEALTH INSURANCE | Admitting: Internal Medicine

## 2011-02-11 LAB — LEAD, BLOOD: Lead-Whole Blood: 0.9 ug/dL (ref <10.0)

## 2011-02-11 NOTE — Procedures (Signed)
Summary: Upper Endoscopy  Patient: [name] Note: All result statuses are Final unless otherwise noted.  Tests: (1) Upper Endoscopy (EGD)   EGD Upper Endoscopy       DONE     [facility]     [place], [place]  [id number]           ENDOSCOPY PROCEDURE REPORT           PATIENT:  [name], [name]  MR#:  [id number]     BIRTHDATE:  03-13-1946, 65 yrs. old  GENDER:  female     ENDOSCOPIST:  Milus Banister, MD     PROCEDURE DATE:  02/07/2011     PROCEDURE:  EGD with lesion ablation     ASA CLASS:  Class III     INDICATIONS:  dark stools, twice a week for 3 months; anemia (Hb     5s, normocytic); EGD and colonoscopy 07/2010 Dr. [name]     MEDICATIONS:  Fentanyl 40 mcg IV, Versed 5 mg IV     TOPICAL ANESTHETIC:  Cetacaine Spray           DESCRIPTION OF PROCEDURE:   After the risks benefits and     alternatives of the procedure were thoroughly explained, informed     consent was obtained.  The [id number]) endoscope was     introduced through the mouth and advanced to the second portion of     the duodenum, without limitations.  The instrument was slowly     withdrawn as the mucosa was fully examined.     <<PROCEDUREIMAGES>>           There were two small "cherry red" AVMs in stomach without active     bleeding. No recent blood in UGI tract. The examination was     otherwise normal. The AVMs were treated with APC application (see     123XX123, image013, image009, image007, image005, image004, and     image003).    Retroflexed views revealed no abnormalities.    The     scope was then withdrawn from the patient and the procedure     completed.     COMPLICATIONS:  None           ENDOSCOPIC IMPRESSION:     1) Two gastric AVMs. These MAY be the source of recent dark     stools (3 months) and anemia.  They were treated with APC to     destruction.           RECOMMENDATIONS:     Transfuse blood as needed. Follow clinically.   If there is still     concern for ongoing blood loss, would repeat colonoscopy +/- small     bowel capsule endoscopy.           ______________________________     Milus Banister, MD           cc: Delfin Edis, MD           n.     eSIGNED:   Milus Banister at 02/07/2011 01:05 PM           [name], [id number]  Note: An exclamation mark (!) indicates a result that was not dispersed into the flowsheet. Document Creation Date: 02/07/2011 1:06 PM _______________________________________________________________________  (1) Order result status: Final Collection or observation date-time: 02/07/2011 12:58 Requested date-time:  Receipt date-time:  Reported date-time:  Referring Physician:  Ordering Physician: Owens Loffler [phone]) Specimen Source:  Source: Tawanna Cooler Order Number: [id number] Lab site:

## 2011-02-12 ENCOUNTER — Encounter (INDEPENDENT_AMBULATORY_CARE_PROVIDER_SITE_OTHER): Payer: Self-pay | Admitting: *Deleted

## 2011-02-13 ENCOUNTER — Encounter: Payer: Self-pay | Admitting: Internal Medicine

## 2011-02-13 ENCOUNTER — Other Ambulatory Visit: Payer: Self-pay | Admitting: Internal Medicine

## 2011-02-13 ENCOUNTER — Telehealth: Payer: Self-pay | Admitting: Internal Medicine

## 2011-02-13 ENCOUNTER — Encounter (INDEPENDENT_AMBULATORY_CARE_PROVIDER_SITE_OTHER): Payer: PRIVATE HEALTH INSURANCE | Admitting: Internal Medicine

## 2011-02-13 DIAGNOSIS — K5520 Angiodysplasia of colon without hemorrhage: Secondary | ICD-10-CM | POA: Insufficient documentation

## 2011-02-13 DIAGNOSIS — I5033 Acute on chronic diastolic (congestive) heart failure: Secondary | ICD-10-CM

## 2011-02-13 DIAGNOSIS — Q2730 Arteriovenous malformation, site unspecified: Secondary | ICD-10-CM | POA: Insufficient documentation

## 2011-02-13 DIAGNOSIS — I4891 Unspecified atrial fibrillation: Secondary | ICD-10-CM

## 2011-02-13 DIAGNOSIS — I5032 Chronic diastolic (congestive) heart failure: Secondary | ICD-10-CM

## 2011-02-13 DIAGNOSIS — I495 Sick sinus syndrome: Secondary | ICD-10-CM

## 2011-02-13 DIAGNOSIS — R0602 Shortness of breath: Secondary | ICD-10-CM

## 2011-02-14 LAB — BASIC METABOLIC PANEL
BUN: 10 mg/dL (ref 6–23)
CO2: 24 mEq/L (ref 19–32)
Calcium: 9.1 mg/dL (ref 8.4–10.5)
Glucose, Bld: 87 mg/dL (ref 70–99)
Sodium: 140 mEq/L (ref 135–145)

## 2011-02-14 LAB — CBC WITH DIFFERENTIAL/PLATELET
Basophils Relative: 0.3 % (ref 0.0–3.0)
Eosinophils Absolute: 0.1 10*3/uL (ref 0.0–0.7)
MCHC: 32.4 g/dL (ref 30.0–36.0)
MCV: 85.2 fl (ref 78.0–100.0)
Monocytes Absolute: 0.3 10*3/uL (ref 0.1–1.0)
Neutro Abs: 2.5 10*3/uL (ref 1.4–7.7)
Neutrophils Relative %: 70 % (ref 43.0–77.0)
RBC: 2.97 Mil/uL — ABNORMAL LOW (ref 3.87–5.11)

## 2011-02-17 ENCOUNTER — Encounter: Payer: Self-pay | Admitting: Family Medicine

## 2011-02-17 DIAGNOSIS — D649 Anemia, unspecified: Secondary | ICD-10-CM | POA: Insufficient documentation

## 2011-02-20 ENCOUNTER — Ambulatory Visit (INDEPENDENT_AMBULATORY_CARE_PROVIDER_SITE_OTHER): Payer: PRIVATE HEALTH INSURANCE | Admitting: Physician Assistant

## 2011-02-20 ENCOUNTER — Encounter: Payer: Self-pay | Admitting: Physician Assistant

## 2011-02-20 DIAGNOSIS — I5033 Acute on chronic diastolic (congestive) heart failure: Secondary | ICD-10-CM

## 2011-02-20 DIAGNOSIS — R0602 Shortness of breath: Secondary | ICD-10-CM

## 2011-02-20 NOTE — Progress Notes (Signed)
Summary: chest pain   Phone Note Call from Patient Call back at Home Phone [phone]   Caller: Patient Reason for Call: Talk to Nurse Summary of Call: pt states her both legs are swelling and pt having heart burn and chest pain on her left side. Initial call taken by: Regan Lemming,  February 13, 2011 12:16 PM  Follow-up for Phone Call        Pt calls today with swelling  in both legs and feet which started in the past day. Right greater than left according to daughter.  Pt aslo says she feels like whenever she tries to eat or drink something " I get bloated".  She was recently discharged from the hospital on Sunday. Her lasix and potassium were stopped b/c of her chronic renal failure according to daughter and dc note. Pt also notes that her urinary output is less even though she goes to the bathroom just as often as before her hospital visit. Pt also reports "bad heartburn last night that lasted from 7:30pm until I fell asleep around 3:30 am this morning".  According to pt she is also having some doe and has used her inhaler " it helps a little".  She is having no heartburn now and wants to know what the doctor thinks she should do. I will forward to Dr. Rayann Heman Follow-up by: Joelyn Oms RN,  February 13, 2011 12:40 PM  Additional Follow-up for Phone Call Additional follow up Details #1::        Pt will come in today at 3:15 and see Dr. Rayann Heman Additional Follow-up by: Joelyn Oms RN,  February 13, 2011 12:50 PM

## 2011-02-20 NOTE — Assessment & Plan Note (Signed)
Summary: add on per message nurse/kl  Medications Added DILTIAZEM HCL CR 90 MG XR12H-CAP (DILTIAZEM HCL) Take one capsule by mouth daily OMEPRAZOLE 20 MG CPDR (OMEPRAZOLE) one by mouth bid CARVEDILOL 6.25 MG TABS (CARVEDILOL) Take one tablet by mouth twice a day * WARFARIN SODIUM Use as directed by Anticoagualtion Clinic      Allergies Added:   Visit Type:  Follow-up Referring Provider:  Dr Eustace Quail Primary Provider:  Karen Kays MD  CC:  abdominal bloating.  History of Present Illness: The patient presents today for routine cardiology followup. She was recently hospitalized for acute blood loss anemia/ GI bleeding and acute on chronic renal failure.  Her coumadin was stopped and she was found to have AVMs which were felt to possibly be the culprit.  Her lasix was also discontinued.   Since discharge, she reports intermittent abdominal bloating.  This sensation occurs following eating.  She also reports SOB with BLE edema.   The patient denies symptoms of palpitations, chest pain,  orthopnea, PND, dizziness, presyncope, syncope, or neurologic sequela. The patient is tolerating medications without difficulties and is otherwise without complaint today.   Current Medications (verified): 1)  Crestor 10 Mg Tabs (Rosuvastatin Calcium) .[name].. 1 Tablet By Mouth Every Night 2)  Xanax 0.5 Mg Tabs (Alprazolam) .... One By Mouth Bed As Needed Anxiety 3)  Tylenol Extra Strength 325 Mg  Tabs (Acetaminophen) .... Take One Tablet Every Six Hours For Belly/side Pain 4)  Proair Hfa 108 (90 Base) Mcg/act  Aers (Albuterol Sulfate) .... Two Puffs Every 4-6 Hours As Needed 5)  Diltiazem Hcl Cr 90 Mg Xr12h-Cap (Diltiazem Hcl) .... Take One Capsule By Mouth Daily 6)  Aspirin 81 Mg Tbec (Aspirin) .... Take 1 Tablet By Mouth Once Daily 7)  Omeprazole 20 Mg Cpdr (Omeprazole) .... Daily 8)  Flovent Hfa 44 Mcg/act Aero (Fluticasone Propionate  Hfa) .... As Directed 9)  Amiodarone Hcl 200 Mg Tabs (Amiodarone  Hcl) .... Take One Tablet By Mouth Daily 10)  Centrum  Tabs (Multiple Vitamins-Minerals) .[name].. 1 Tablet By Mouth Daily 11)  Align  Caps (Probiotic Product) .... Take One Daily 12)  Senokot 8.6 Mg Tabs (Sennosides) .... Take 2 Tabs At Bedtime 13)  Carvedilol 6.25 Mg Tabs (Carvedilol) .... Take One Tablet By Mouth Twice A Day 14)  Warfarin Sodium .... Use As Directed By Anticoagualtion Clinic  Allergies (verified): 1)  ! Ace Inhibitors  Past History:  Past Medical History: Admit for GI Bleeding, anemia, and acute on chronic renal failure 2/12 Bleeding felt to be due to AVMs -Panic attacks -Chronic, persistent asthma. Cardiac:  -hypertrophic cardiomyopathy (Dx 05/2008)--followed by Dr. [name] ([facility])  -diastolic heart failure (EDW 180#)    -ECHO 05/30/10: EF 55-60%; LV moderately dilated c increased thickness c/w moderate LVH    -Normal coronary angiography (04/2008) c LVH c/w hypertrophic CM, normal PAW 6    -Cardiolite 08/1999 --neg ischemia - 04/30/2000, s/p BTL - 04/01/2000  -s/p St. Jude DDD (dual-mode, dual-pacing, dual-sensing) pacemaker for complete AV block 1998  -atril fibrillation (Dx 10/2010)     -s/p failed cardioversion (10/2010) now on coumadin      -Pradaxa tried by [facility] but stopped due to cost   Other CV:  -Hypertension.  -Hyperlipidemia. -Continued cigarette use.  -h/o dyspepsia   Past problems: -h/o angioedema due to ACEI -h/o RLS (Dx 06/2007)  Past Surgical History: Reviewed history from 07/23/2010 and no changes required. Cardiac cath 5/09: normal angiography with LVH c/w hypertrophic CM.  Normal PAW of 6.   Cardiolite 9/00 - neg ischemia - 04/30/2000,  s/p BTL - 04/01/2000,  s/p pacemaker placement -1998   echo 05/30/10: Left ventricle: The cavity size was moderately dilated. Wall     thickness was increased in a pattern of moderate LVH. Systolic     function was normal. The estimated ejection fraction was in the     range of 55% to 60%.  Social  History: Reviewed history from 12/23/2010 and no changes required. On disability for heart failure/pacemaker.   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   Drinks alcohol a few times a week socially. At one time, the most she reports drinking is about 3 mixed drinks. Lives with daughter [name]. No illicit drugs. Smokes few cigarettes daily, especially when stressed.   Review of Systems       All systems are reviewed and negative except as listed in the HPI.   Vital Signs:  Patient profile:   65 year old female Height:      65 inches Weight:      181 pounds Pulse rate:   70 / minute BP sitting:   120 / 86  (right arm)  Vitals Entered By: Margaretmary Bayley CMA (February 13, 2011 3:24 PM)  Physical Exam  General:  Well developed, well nourished, in no acute distress. Head:  normocephalic and atraumatic Eyes:  PERRLA/EOM intact; conjunctiva and lids normal. Mouth:  Teeth, gums and palate normal. Oral mucosa normal. Neck:  supple, JVP 10cm Lungs:  few basilar rales, expiratory wheezing Heart:  RRR, no m/r/g Abdomen:  Bowel sounds positive; abdomen soft and non-tender without masses, organomegaly, or hernias noted. No hepatosplenomegaly. Msk:  Back normal, normal gait. Muscle strength and tone normal. Extremities:  No clubbing or cyanosis.  2+ edema Neurologic:  Alert and oriented x 3. Skin:  Intact without lesions or rashes. Psych:  anxious, mildly depressed appearing   PPM Specifications Following MD:  Thompson Grayer, MD     PPM Vendor:  St Jude     PPM Model Number:  [id number]     PPM Serial Number:  [id number] PPM DOI:  11/17/2003     PPM Implanting MD:  Vanna Scotland. [name], MD  Lead 1    Location: [facility]     DOI: 07/08/1985     Model #: [id number]     Serial #: [id number]     Status: active Lead 2    Location: RV     DOI: 07/08/1985     Model #: [id number]     Serial #: [id number]     Status: active   Indications:  Complete Heart Block   PPM Follow Up Pacer  Dependent:  Yes      Episodes Coumadin:  No  Parameters Mode:  DDDR     Lower Rate Limit:  70     Upper Rate Limit:  120 Paced AV Delay:  170     Sensed AV Delay:  150 MD Comments:  see scanned report  Impression & Recommendations:  Problem # 1:  GI BLEEDING (ICD-578.9) recent hospital admit for GI bleeding, felt likely to be due to AVMs we will check CBC today coumadin remains on hold  given dyspepsia/ bloating with eating, we will increase PPI to two times a day   Problem # 2:  CHRONIC DIASTOLIC HEART FAILURE (0000000) she appears volume overloaded on exam today I suspect that we need to restart  lasix.  We will check creatinine and K today and restart lasix if back to baseline salt restriction advised check BNP  Problem # 3:  PACEMAKER (ICD-V45.01) not yet at [facility] will return in 4 weeks to see me  Problem # 4:  ATRIAL FIBRILLATION (ICD-427.31) presently in sinus rhythm coumadin on hold due to recent GI bleeding  Problem # 5:  CHRONIC KIDNEY DISEASE STAGE III (MODERATE) (ICD-585.3) check BMET today restart lasix if creatinine is back to baseline  Problem # 6:  HYPERTENSION, BENIGN SYSTEMIC (ICD-401.1) stable  Other Orders: TLB-BMP (Basic Metabolic Panel-BMET) (99991111) TLB-BNP (B-Natriuretic Peptide) (83880-BNPR) TLB-CBC Platelet - w/Differential (85025-CBCD)  Patient Instructions: 1)  Your physician recommends that you schedule a follow-up appointment in: one week with Richardson Dopp, PA and 4 weeks with Dr Rayann Heman 2)  Your physician has recommended you make the following change in your medication: increase Ompprazole to 20mg  two times a day 3)  Your physician recommends that you return for lab work today Prescriptions: OMEPRAZOLE 20 MG CPDR (OMEPRAZOLE) one by mouth bid  #60 x 3   Entered by:   Janan Halter, RN, BSN   Authorized by:   Thompson Grayer, MD   Signed by:   Janan Halter, RN, BSN on 02/13/2011   Method used:   Electronically to        [facility] [address] [phone]* (retail)       [address]       [address]       [place], [place]  [address]       Ph: [phone]       Fax: [phone]   Russell [phone]

## 2011-02-20 NOTE — Letter (Signed)
Summary: Appointment - Missed  [facility], [place]  [address] [place]   [place], [place] [address]   Phone: [phone]  Fax: [phone]     February 12, 2011 MRN: [id number]   [name] [place], [place]  [address]   Dear Ms. Maeda-POLK,  Our records indicate you missed your appointment on  2.23.12  with Dr. Rayann Heman for your 4 week follow up appointment.  It is very important that we reach you to reschedule this appointment. We look forward to participating in your health care needs. Please contact us at the number listed above at your earliest convenience to reschedule this appointment.     Sincerely,    Public relations account executive

## 2011-02-24 NOTE — Progress Notes (Signed)
  Subjective:    Patient ID: [name], female    DOB: Apr 19, 1946, 65 y.o.   MRN: [id number]  HPI 1. Hospital f/u for anemia 2/2 UGIB likely 2/2 AVM Presenting symptoms was SOB and fatigue. Feels more energetic since discharge but still occasional fatigue. Has not followed up with GI yet but has appointment this month.  Recent CBC since D/C from hospital showed improving Hgb/Hct with iron.  ROS: denies emesis; occasional abdominal discomfort; no blood in stool; no dyspnea  2. Cardiac: atrial fibrillation s/p pacemaker, chronic diastolic heart failure, HTN Saw Cardiologist on 02/20/2011. BNP found elevated and thought to be volume overloaded and had dyspepsia. Re-started on Lasix and asked to double PPI dose.   Still off warfarin (D/C'd during hospitalization 2/2 bleeding).   Review of Systems  Constitutional: Positive for fatigue. Negative for fever, diaphoresis and appetite change.  Respiratory: Negative for chest tightness and shortness of breath.   Cardiovascular: Negative for chest pain, palpitations and leg swelling.  Genitourinary: Negative for dysuria and difficulty urinating.  Neurological: Negative for light-headedness.       Objective:   Physical Exam  Constitutional: She appears well-developed.       Pleasant, well-groomed, overweight  Cardiovascular: Normal rate, regular rhythm, normal heart sounds and intact distal pulses.        1+ pedal pulses; distant heart sounds   Pulmonary/Chest: Effort normal and breath sounds normal. No respiratory distress. She has no wheezes. She has no rales. She exhibits no tenderness.  Abdominal: Soft. There is no tenderness.       obese  Musculoskeletal:       1+ pretibial edema bilaterally  Psychiatric: She has a normal mood and affect. Her behavior is normal. Judgment and thought content normal.       Not anxious-appearing          Assessment & Plan:

## 2011-02-24 NOTE — Discharge Summary (Signed)
[name], [name]       ACCOUNT NO.:  [id number]  MEDICAL RECORD NO.:  [id number]           PATIENT TYPE:  I  LOCATION:  [id number]                         FACILITY:  [facility]  PHYSICIAN:  Jamal Collin. Pietrina Jagodzinski, M.D.DATE OF BIRTH:  December 03, 1946  DATE OF ADMISSION:  02/06/2011 DATE OF DISCHARGE:  02/09/2011                              DISCHARGE SUMMARY   PRIMARY CARE PROVIDER:  Karen Kays, MD in [facility].  DISCHARGE DIAGNOSES: 1. Anemia. 2. Nonbleeding gastric arteriovenous malformation. 3. Acute on chronic kidney failure. 4. Hypertension. 5. History of atrial fibrillation.  DISCHARGE MEDICATIONS: 1. Coreg 6.25 mg p.o. b.i.d. 2. Ferrous sulfate 325 mg p.o. b.i.d. 3. Diltiazem 90 mg p.o. daily. 4. Align 4 mg p.o. daily. 5. Alprazolam 0.5 mg 1 tab p.o. nightly p.r.n. anxiety. 6. Amiodarone 200 mg p.o. daily. 7. Aspirin 81 mg p.o. daily. 8. Crestor 10 mg p.o. nightly. 9. Fish oil over the counter 2 capsules p.o. daily. 10.Flovent 2 puffs inhaled daily p.r.n. shortness of breath or     allergies. 11.Omeprazole 20 mg p.o. daily. 12.MiraLax 17 g p.o. daily p.r.n. constipation. 13.Senokot-S 2 tabs p.o. daily nightly. 14.Tylenol 325 mg p.o. q.6 h. p.r.n. for pain.  DISCONTINUED MEDICATIONS: 1. Potassium chloride 20 mEq p.o. daily. 2. Furosemide 40 mg p.o. daily. 3. Coreg 12.5 mg p.o. daily. 4. Warfarin one to one and a half tablets p.o. daily.  CONSULTANTS:  Gastroenterology.  PROCEDURES:  On February 07, 2011, the patient had an EGD.  PERTINENT LABORATORY VALUES:  On February 06, 2011, CBC with differential white blood cell count 3.9, hemoglobin 5.2, hematocrit 17.8, platelets 294, differential normal.  INR is 1.39.  Complete metabolic panel significant for potassium of 6.5, BUN 46, creatinine 4.69.  On February 07, 2011, haptoglobin was 213.  CBC, white blood cell count 4.3, hemoglobin 6.4, hematocrit 20.6, platelets 229,000.  BMET significant for  creatinine of 2.43.  On February 08, 2011, CBC showed improvement of hemoglobin 7.4, hematocrit 24.0, and on February 09, 2011, CBC showed hemoglobin 7.3, hematocrit 24.4, and a basic metabolic panel showed improvement of  her creatinine to 1.47.  RADIOLOGY:  On February 06, 2011, a chest x-ray two-view showed cardiac enlargement with increased pulmonary vascularity unchanged.  BRIEF HOSPITAL COURSE:  [name] is a 65 year old female who presented to the hospital complaining of shortness of breath. Her initial labs in the emergency department showed anemia and acute on chronic renal failure. 1. Anemia.  The patient had heme-positive stools in the emergency     department.  She was transfused 2 units of blood in the evening of     admission and Gastroenterology was consulted.  The patient's     hemoglobin only increased from 5.2-6.4 with 2 units of blood, so     she was given a third unit of blood; and after that, her hemoglobin     remained stable above 7 throughout the rest of hospitalization.    The patient was started on p.o. iron as it was felt that a     component of her anemia was due to iron deficiency.     Gastroenterology took the patient to  endoscopy and found some     nonbleeding AVMs, they felt that this could have been the cause of     her heme-positive stools and that her renal failure could have     contributed to her anemia as well as iron deficiency.  The patient     had a recent colonoscopy in August 2011 and they did not feel the     repeating it would be beneficial.  Gastroenterology felt that     following the patient's hemoglobin for a few days to ensure it was     stable was the best thing to do.  If the patient continues to have     anemia and heme-positive stools, they will consider a capsule     endoscopy. 2. Acute on chronic renal failure.  The patient presented to the     hospital dehydrated with a creatinine of 4.69, her baseline is 1.2-      1.6.  She was hydrated with IV fluids and also received blood     during this hospitalization.  Her FENa was less than 1 showing a     prerenal picture.  Her creatinine slowly decreased on a daily basis     during hospitalization; and on the day of discharge, it was back to     her baseline.  The patient was on Lasix and potassium replacement     when she was admitted; however, this was discontinued at discharge     due to her acute on chronic renal failure.  We would encourage Dr.     Verdie Drown to examine the patient and determine if she is fluid     overloaded and needs of the Lasix at her next office visit. 3. History of atrial fibrillation.  The patient had some hypotension     that was not severe during this hospitalization, however, some of     her blood pressure and rate control medications were decreased due     to this.  Her Coreg was decreased from 12.5 mg p.o. b.i.d. to 6.25     mg p.o. b.i.d.  Her diltiazem was also decreased from 180 mg p.o.     daily to 90 mg p.o. daily.  We would ask Dr. Verdie Drown to assess as     needed as her inpatient hospitalization may not be representative     of her blood pressure as an outpatient.  The patient was admitted     on Coumadin, however, due to her severe anemia and concern for GI     bleeding, this was not continued due to subtherapeutic when she was     admitted.  Her aspirin was however continued.  We would ask Dr. Verdie Drown to discuss the risks and benefits of taking Coumadin in the     setting of a possible GI bleed at her next outpatient appointment.  DISCHARGE ISSUES/RECOMMENDATIONS:  The patient will follow up with Dr. Verdie Drown at [facility] in early March.  The patient's Coreg and diltiazem have been decreased during hospitalization and may need to be tapered back up.  The patient's Coumadin was discontinued during this hospitalization due to concern for a GI bleed.  The patient was severely anemic and when she was  discharged her hemoglobin was in the 7 range.  We would ask Dr. Verdie Drown to recheck her hemoglobin and determine if she needs to follow up with her gastroenterologist soon.  DISCHARGE CONDITION:  The patient was discharged home in stable medical condition.    ______________________________ Cletus Gash, MD   ______________________________ Jamal Collin. Andria Frames, M.D.    CR/MEDQ  D:  02/10/2011  T:  02/11/2011  Job:  [id number]  cc:   [name] Kays, MD  Electronically Signed by Cletus Gash MD on 02/23/2011 12:07:55 PM Electronically Signed by Madison Hickman M.D. on 02/24/2011 09:09:52 AM

## 2011-02-25 ENCOUNTER — Ambulatory Visit (INDEPENDENT_AMBULATORY_CARE_PROVIDER_SITE_OTHER): Payer: PRIVATE HEALTH INSURANCE | Admitting: Family Medicine

## 2011-02-25 ENCOUNTER — Encounter: Payer: Self-pay | Admitting: Family Medicine

## 2011-02-25 DIAGNOSIS — I5032 Chronic diastolic (congestive) heart failure: Secondary | ICD-10-CM

## 2011-02-25 DIAGNOSIS — K5900 Constipation, unspecified: Secondary | ICD-10-CM | POA: Insufficient documentation

## 2011-02-25 DIAGNOSIS — I10 Essential (primary) hypertension: Secondary | ICD-10-CM

## 2011-02-25 DIAGNOSIS — K922 Gastrointestinal hemorrhage, unspecified: Secondary | ICD-10-CM

## 2011-02-25 DIAGNOSIS — H1045 Other chronic allergic conjunctivitis: Secondary | ICD-10-CM

## 2011-02-25 DIAGNOSIS — Z950 Presence of cardiac pacemaker: Secondary | ICD-10-CM

## 2011-02-25 DIAGNOSIS — I4891 Unspecified atrial fibrillation: Secondary | ICD-10-CM

## 2011-02-25 DIAGNOSIS — H1013 Acute atopic conjunctivitis, bilateral: Secondary | ICD-10-CM | POA: Insufficient documentation

## 2011-02-25 DIAGNOSIS — F411 Generalized anxiety disorder: Secondary | ICD-10-CM

## 2011-02-25 DIAGNOSIS — Z720 Tobacco use: Secondary | ICD-10-CM

## 2011-02-25 DIAGNOSIS — F17200 Nicotine dependence, unspecified, uncomplicated: Secondary | ICD-10-CM

## 2011-02-25 DIAGNOSIS — I509 Heart failure, unspecified: Secondary | ICD-10-CM

## 2011-02-25 DIAGNOSIS — D649 Anemia, unspecified: Secondary | ICD-10-CM

## 2011-02-25 LAB — CBC
MCHC: 32.7 g/dL (ref 30.0–36.0)
Platelets: 186 10*3/uL (ref 150–400)
RDW: 15 % (ref 11.5–15.5)

## 2011-02-25 LAB — BASIC METABOLIC PANEL
BUN: 30 mg/dL — ABNORMAL HIGH (ref 6–23)
Calcium: 9.8 mg/dL (ref 8.4–10.5)
GFR calc non Af Amer: 30 mL/min — ABNORMAL LOW (ref >60)
Glucose, Bld: 121 mg/dL — ABNORMAL HIGH (ref 70–99)
Sodium: 139 mEq/L (ref 135–145)

## 2011-02-25 LAB — PROTIME-INR
INR: 2.53 — ABNORMAL HIGH (ref 0.00–1.49)
Prothrombin Time: 27.4 seconds — ABNORMAL HIGH (ref 11.6–15.2)

## 2011-02-25 MED ORDER — OLOPATADINE HCL 0.2 % OP SOLN: 1.0000 [drp] | Freq: Every day | OPHTHALMIC | Status: DC

## 2011-02-25 MED ORDER — PROBIOTIC ACIDOPHILUS BIOBEADS PO CAPS: 1.0000 | ORAL_CAPSULE | Freq: Every day | ORAL | Status: DC

## 2011-02-25 MED ORDER — SENNOSIDES 8.6 MG PO TABS: 1.0000 | ORAL_TABLET | Freq: Two times a day (BID) | ORAL | Status: DC

## 2011-02-25 NOTE — Cardiovascular Report (Signed)
Summary: Office Visit   Office Visit   Imported By: Sallee Provencal 02/19/2011 12:46:25  _____________________________________________________________________  External Attachment:    Type:   Image     Comment:   External Document

## 2011-02-25 NOTE — Patient Instructions (Signed)
Well it was so nice to see you.  Please make an appointment to see me again in 1 month.    But a week before then, please make an appointment for the lab.  I will send in the prescriptions for the sennakot, probiotic, and the eye drops.   Easy ways to help you stay regular include eating lots of high fiber foods and regular exercise.  Congratulations for stopping smoking and decreasing alcohol.    If you need more help this call, let me know.

## 2011-02-25 NOTE — Assessment & Plan Note (Addendum)
Summary: F/U 1 WK PER CHECK OUT/SF  Medications Added FUROSEMIDE 40 MG TABS (FUROSEMIDE) Take one tablet by mouth once daily      Allergies Added:   Visit Type:  Follow-up Referring Provider:  Dr Eustace Quail Primary Provider:  Karen Kays MD  CC:  shortness of breath, chest pressure-heart burn, and swelling in legs.  History of Present Illness: Primary Electrophysiologist:  Dr. Thompson Grayer  [name] is a 65 yo female with diastolic CHF.  She was recently hospitalized for acute blood loss anemia/ GI bleeding and acute on chronic renal failure.  Her coumadin was stopped and she was found to have AVMs which were felt to possibly be the culprit.  Her lasix was also discontinued.   She was seen last week by Dr. Rayann Heman and since discharge, she reported intermittent abdominal bloating.  This sensation would occur following eating.  She also reported SOB with BLE edema.   The patient denied symptoms of palpitations, chest pain,  orthopnea, PND, dizziness, presyncope, syncope, or neurologic sequela. Her BNP was noted to be elevated on lab work obtained after her last visit.  Lasix was restarted.  Her potassium was 4.4, creatinine 1.2.  Hemoglobin is 8.2 and felt to be stable as compared to her admission labs.  She returns for followup.  Unfortunately, she never started back on her Lasix.  She continues to feel abdominal bloating.  She describes dyspnea with exertion at NYHA class IIb to 3.  She is somewhat of a difficult historian.  It sounds as though she is describing orthopnea and PND.  She does have lower extremity edema.  She has some chest heaviness as well.  She denies syncope.  She was told last time to increase her omeprazole to twice a day.  She's had increased dyspepsia.  She didn't understand these instructions and never started taking this dosage.  Current Medications (verified): 1)  Crestor 10 Mg Tabs (Rosuvastatin Calcium) .[name].. 1 Tablet By Mouth Every Night 2)  Xanax  0.5 Mg Tabs (Alprazolam) .... One By Mouth Bed As Needed Anxiety 3)  Tylenol Extra Strength 325 Mg  Tabs (Acetaminophen) .... Take One Tablet Every Six Hours For Belly/side Pain 4)  Proair Hfa 108 (90 Base) Mcg/act  Aers (Albuterol Sulfate) .... Two Puffs Every 4-6 Hours As Needed 5)  Diltiazem Hcl Cr 90 Mg Xr12h-Cap (Diltiazem Hcl) .... Take One Capsule By Mouth Daily 6)  Aspirin 81 Mg Tbec (Aspirin) .... Take 1 Tablet By Mouth Once Daily 7)  Omeprazole 20 Mg Cpdr (Omeprazole) .... One By Mouth Bid 8)  Flovent Hfa 44 Mcg/act Aero (Fluticasone Propionate  Hfa) .... As Directed 9)  Amiodarone Hcl 200 Mg Tabs (Amiodarone Hcl) .... Take One Tablet By Mouth Daily 10)  Centrum  Tabs (Multiple Vitamins-Minerals) .[name].. 1 Tablet By Mouth Daily 11)  Align  Caps (Probiotic Product) .... Take One Daily 12)  Senokot 8.6 Mg Tabs (Sennosides) .... Take 2 Tabs At Bedtime 13)  Carvedilol 6.25 Mg Tabs (Carvedilol) .... Take One Tablet By Mouth Twice A Day 14)  Warfarin Sodium .... Use As Directed By [facility]ticoagualtion Clinic  Allergies (verified): 1)  ! Ace Inhibitors  Past History:  Past Medical History: Last updated: 02/13/2011 Admit for GI Bleeding, anemia, and acute on chronic renal failure 2/12 Bleeding felt to be due to AVMs -Panic attacks -Chronic, persistent asthma. Cardiac:  -hypertrophic cardiomyopathy (Dx 05/2008)--followed by Dr. [name] ([facility])  -diastolic heart failure (EDW 180#)    -ECHO 05/30/10: EF  55-60%; LV moderately dilated c increased thickness c/w moderate LVH    -Normal coronary angiography (04/2008) c LVH c/w hypertrophic CM, normal PAW 6    -Cardiolite 08/1999 --neg ischemia - 04/30/2000, s/p BTL - 04/01/2000  -s/p St. Jude DDD (dual-mode, dual-pacing, dual-sensing) pacemaker for complete AV block 1998  -atril fibrillation (Dx 10/2010)     -s/p failed cardioversion (10/2010) now on coumadin      -Pradaxa tried by [facility] but stopped due to cost   Other CV:   -Hypertension.  -Hyperlipidemia. -Continued cigarette use. -h/o dyspepsia   Past problems: -h/o angioedema due to ACEI -h/o RLS (Dx 06/2007)  Vital Signs:  Patient profile:   65 year old female Height:      65 inches Weight:      180.25 pounds BMI:     30.10 Pulse rate:   62 / minute BP sitting:   103 / 70  (left arm) Cuff size:   regular  Vitals Entered By: Hansel Feinstein CMA (February 20, 2011 3:11 PM)  Physical Exam  General:  Well nourished, well developed, in no acute distress HEENT: normal Neck: + JVD Cardiac:  normal S1, S2; RRR; no murmur; no S3 Lungs:  bibasilar crackles; no wheezing Abd: soft, nontender, no hepatomegaly Ext: 1+ bilateral edema Skin: warm and dry Neuro:  CNs 2-12 intact, no focal abnormalities noted    EKG  Procedure date:  02/20/2011  Findings:      AV paced  PPM Specifications Following MD:  Thompson Grayer, MD     PPM Vendor:  [employer] Jude     PPM Model Number:  [id number]     PPM Serial Number:  [id number] PPM DOI:  11/17/2003     PPM Implanting MD:  Vanna Scotland. [name], MD  Lead 1    Location: [facility]     DOI: 07/08/1985     Model #: [id number]     Serial #: [id number]     Status: active Lead 2    Location: [facility]     DOI: 07/08/1985     Model #: [id number]     Serial #: [id number]     Status: active   Indications:  Complete Heart Block   PPM Follow Up Pacer Dependent:  Yes      Episodes Coumadin:  No  Parameters Mode:  DDDR     Lower Rate Limit:  70     Upper Rate Limit:  120 Paced AV Delay:  170     Sensed AV Delay:  150  Impression & Recommendations:  Problem # 1:  ACUTE ON CHRONIC DIASTOLIC HEART FAILURE ([id number]) She has symptoms consistent with volume overload.  Her BNP was also significantly elevated.  Unfortunately, she never did start taking her Lasix.  We will make sure she has a prescription called into her pharmacy for Lasix 40 mg a day.  She will need a followup metabolic panel and BNP in one week.  I can see her back in followup in the next one to 2  weeks.  Problem # 2:  GI BLEEDING (ICD-578.9) Followup with gastroenterology.  Her recent hemoglobin was stable.  Problem # 3:  GERD I have asked her again to increase her omeprazole to twice a day.  Problem # 4:  ATRIAL FIBRILLATION (ICD-427.31) She appears to be maintaining sinus rhythm on her EKG today.  Patient Instructions: 1)  Your physician recommends that you schedule a follow-up appointment in: 1 to 2 weeks with Richardson Dopp PA 2)  Your physician recommends that you return for lab work in:one week BMET, BNP. 3)  Your physician has recommended you make the following change in your medication: Increase Omeprazole to one tablet twice a day. Furosemide 40 mg. Take one tablet once a day. Prescription was send to [facility] pharmacy. Prescriptions: FUROSEMIDE 40 MG TABS (FUROSEMIDE) Take one tablet by mouth once daily  #30 x 5   Entered by:   Carollee Sires, RN, BSN   Authorized by:   Richardson Dopp PA-C   Signed by:   Carollee Sires, RN, BSN on 02/20/2011   Method used:   Electronically to        [facility] [phone]* (retail)       [address]       [address]       [place], [place]  [address]       Ph: [phone]       Fax: [phone]   [id number]  I have personally reviewed the prescriptions today for accuracy.. . Richardson Dopp PA-C  February 20, 2011 4:39 PM

## 2011-02-26 ENCOUNTER — Encounter: Payer: Self-pay | Admitting: Family Medicine

## 2011-02-26 DIAGNOSIS — Z720 Tobacco use: Secondary | ICD-10-CM | POA: Insufficient documentation

## 2011-02-26 NOTE — Assessment & Plan Note (Signed)
ECG 03/08 @ Cardiologist's office showed sinus rhythm.  Currently off warfarin but with pacemaker.

## 2011-02-26 NOTE — Assessment & Plan Note (Addendum)
Attributed to AVM although no active bleeding visualized on endoscopy. S/p 3 units of blood in hospital. Continue taking iron. Do not need to recheck CBC at this time since recent CBC on 03/01 showed improving Hgb/Hct. Will follow-up with GI in a few weeks. Continue PPI twice daily.

## 2011-02-26 NOTE — Assessment & Plan Note (Signed)
Controlled  Continue medications

## 2011-02-26 NOTE — Assessment & Plan Note (Addendum)
Likely 2/2 UGIB 2/2 AVM and iron-deficiency component. Improving with blood transfusion during hospitalization and iron supplements. Continue taking iron. Do not need to recheck CBC at this time since recent CBC on 03/01 showed improving Hgb/Hct. Will follow-up with GI in a few weeks.

## 2011-02-26 NOTE — Assessment & Plan Note (Addendum)
Currently being followed closely by cardiologist who is concerned about fluid overload since patient had been off of Lasix for a few weeks. Started back on 03/08. Patient had some lower extremity edema but did not otherwise seem to be fluid overloaded today with no respiratory discomfort or findings. She appeared comfortable. She should be going back to see cardiologist for lab work (BNP which was elevated and BMET) and follow-up. No changes to her medications. Continue amiodarone, beta-blocker, diltiazem, Lasix.

## 2011-02-26 NOTE — Assessment & Plan Note (Signed)
Commended patient on quitting a few days ago. Patient not interested in medications at this time to help with this. Advised her to discuss with me further in future if she was interested in medications or other resources.

## 2011-02-26 NOTE — Assessment & Plan Note (Signed)
Controlled. Takes Xanax about once weekly. Will monitor this especially since she has stopped smoking recently.

## 2011-02-28 ENCOUNTER — Other Ambulatory Visit (INDEPENDENT_AMBULATORY_CARE_PROVIDER_SITE_OTHER): Payer: PRIVATE HEALTH INSURANCE

## 2011-02-28 ENCOUNTER — Ambulatory Visit (INDEPENDENT_AMBULATORY_CARE_PROVIDER_SITE_OTHER): Payer: PRIVATE HEALTH INSURANCE | Admitting: Physician Assistant

## 2011-02-28 ENCOUNTER — Other Ambulatory Visit: Payer: Self-pay | Admitting: Physician Assistant

## 2011-02-28 ENCOUNTER — Encounter: Payer: Self-pay | Admitting: Physician Assistant

## 2011-02-28 DIAGNOSIS — I4891 Unspecified atrial fibrillation: Secondary | ICD-10-CM

## 2011-02-28 DIAGNOSIS — I5033 Acute on chronic diastolic (congestive) heart failure: Secondary | ICD-10-CM

## 2011-02-28 DIAGNOSIS — R0602 Shortness of breath: Secondary | ICD-10-CM

## 2011-02-28 LAB — BASIC METABOLIC PANEL
BUN: 27 mg/dL — ABNORMAL HIGH (ref 6–23)
Creatinine, Ser: 1.6 mg/dL — ABNORMAL HIGH (ref 0.4–1.2)
GFR: 42.87 mL/min — ABNORMAL LOW (ref >60.00)

## 2011-02-28 LAB — BRAIN NATRIURETIC PEPTIDE: Pro B Natriuretic peptide (BNP): 699.8 pg/mL — ABNORMAL HIGH (ref 0.0–100.0)

## 2011-03-02 LAB — BASIC METABOLIC PANEL
BUN: 22 mg/dL (ref 6–23)
CO2: 27 mEq/L (ref 19–32)
Chloride: 102 mEq/L (ref 96–112)
Chloride: 95 mEq/L — ABNORMAL LOW (ref 96–112)
Creatinine, Ser: 1.58 mg/dL — ABNORMAL HIGH (ref 0.4–1.2)
Creatinine, Ser: 1.62 mg/dL — ABNORMAL HIGH (ref 0.4–1.2)
GFR calc Af Amer: 38 mL/min — ABNORMAL LOW (ref >60)
GFR calc non Af Amer: 33 mL/min — ABNORMAL LOW (ref >60)
Potassium: 4.7 mEq/L (ref 3.5–5.1)
Sodium: 133 mEq/L — ABNORMAL LOW (ref 135–145)

## 2011-03-02 LAB — CBC
Platelets: 237 10*3/uL (ref 150–400)
RBC: 3.21 MIL/uL — ABNORMAL LOW (ref 3.87–5.11)
WBC: 6.2 10*3/uL (ref 4.0–10.5)

## 2011-03-03 LAB — BRAIN NATRIURETIC PEPTIDE: Pro B Natriuretic peptide (BNP): 724 pg/mL — ABNORMAL HIGH (ref 0.0–100.0)

## 2011-03-03 LAB — URINE CULTURE

## 2011-03-03 LAB — BASIC METABOLIC PANEL
Chloride: 108 mEq/L (ref 96–112)
GFR calc non Af Amer: 37 mL/min — ABNORMAL LOW (ref >60)
Glucose, Bld: 137 mg/dL — ABNORMAL HIGH (ref 70–99)
Potassium: 4.5 mEq/L (ref 3.5–5.1)
Sodium: 140 mEq/L (ref 135–145)

## 2011-03-03 LAB — DIFFERENTIAL
Eosinophils Absolute: 0.1 10*3/uL (ref 0.0–0.7)
Eosinophils Relative: 1 % (ref 0–5)
Lymphocytes Relative: 25 % (ref 12–46)
Lymphs Abs: 1.6 10*3/uL (ref 0.7–4.0)
Monocytes Absolute: 0.4 10*3/uL (ref 0.1–1.0)

## 2011-03-03 LAB — CBC
HCT: 31.9 % — ABNORMAL LOW (ref 36.0–46.0)
Hemoglobin: 10.6 g/dL — ABNORMAL LOW (ref 12.0–15.0)
MCV: 94.4 fL (ref 78.0–100.0)
WBC: 6.6 10*3/uL (ref 4.0–10.5)

## 2011-03-03 LAB — URINE MICROSCOPIC-ADD ON

## 2011-03-03 LAB — URINALYSIS, ROUTINE W REFLEX MICROSCOPIC
Glucose, UA: NEGATIVE mg/dL
Hgb urine dipstick: NEGATIVE
Protein, ur: NEGATIVE mg/dL
Specific Gravity, Urine: 1.01 (ref 1.005–1.030)

## 2011-03-03 LAB — POCT CARDIAC MARKERS: Troponin i, poc: <0.05 ng/mL (ref 0.00–0.09)

## 2011-03-04 NOTE — Assessment & Plan Note (Signed)
Summary: 1wk f/u sl    Referring Provider:  Dr Eustace Quail Primary Provider:  Karen Kays MD  CC:  headache and sob also fatigue.  History of Present Illness: Primary Electrophysiologist:  Dr. Thompson Grayer  [name] is a 65 yo female with diastolic CHF.  She was recently hospitalized for acute blood loss anemia/ GI bleeding and acute on chronic renal failure.  Her coumadin was stopped and she was found to have AVMs which were felt to possibly be the culprit.  Her lasix was also discontinued.   She was recently seen by Dr. Rayann Heman and since discharge, she reported intermittent abdominal bloating.  This sensation would occur following eating.  She also reported SOB with BLE edema.   Her BNP was noted to be elevated on lab work obtained and Lasix was to be restarted.  I saw her last week and she never started taking her lasix.  I filled this for her and had her follow up today.  She continues to be a difficult historian.  She continues to note some DOE.  She is probably NYHA Class 2b.  She denies orthopnea or PND.   She has had some edema.  No syncope or chest pain.  She admits to taking the lasix.  Her weight is down 3 pounds.    Current Medications (verified): 1)  Crestor 10 Mg Tabs (Rosuvastatin Calcium) .[name].. 1 Tablet By Mouth Every Night 2)  Xanax 0.5 Mg Tabs (Alprazolam) .... One By Mouth Bed As Needed Anxiety 3)  Tylenol Extra Strength 325 Mg  Tabs (Acetaminophen) .... Take One Tablet Every Six Hours For Belly/side Pain 4)  Proair Hfa 108 (90 Base) Mcg/act  Aers (Albuterol Sulfate) .... Two Puffs Every 4-6 Hours As Needed 5)  Diltiazem Hcl Cr 90 Mg Xr12h-Cap (Diltiazem Hcl) .... Take One Capsule By Mouth Daily 6)  Aspirin 81 Mg Tbec (Aspirin) .... Take 1 Tablet By Mouth Once Daily 7)  Omeprazole 20 Mg Cpdr (Omeprazole) .... One By Mouth Bid 8)  Flovent Hfa 44 Mcg/act Aero (Fluticasone Propionate  Hfa) .... As Directed 9)  Amiodarone Hcl 200 Mg Tabs (Amiodarone Hcl) ....  Take One Tablet By Mouth Daily 10)  Centrum  Tabs (Multiple Vitamins-Minerals) .[name].. 1 Tablet By Mouth Daily 11)  Align  Caps (Probiotic Product) .... Take One Daily 12)  Senokot 8.6 Mg Tabs (Sennosides) .... Take 2 Tabs At Bedtime 13)  Carvedilol 6.25 Mg Tabs (Carvedilol) .... Take One Tablet By Mouth Twice A Day 14)  Warfarin Sodium .... Use As Directed By [facility]ticoagual[facility] Clinic 15)  Furosemide 40 Mg Tabs (Furosemide) .... Take One Tablet By Mouth Once Daily  Allergies: 1)  ! Ace Inhibitors  Past History:  Past Medical History: Last updated: 02/13/2011 Admit for GI Bleeding, anemia, and acute on chronic renal failure 2/12 Bleeding felt to be due to AVMs -Panic attacks -Chronic, persistent asthma. Cardiac:  -hypertrophic cardiomyopathy (Dx 05/2008)--followed by Dr. [name] ([facility])  -diastolic heart failure (EDW 180#)    -ECHO 05/30/10: EF 55-60%; LV moderately dilated c increased thickness c/w moderate LVH    -Normal coronary angiography (04/2008) c LVH c/w hypertrophic CM, normal PAW 6    -Cardiolite 08/1999 --neg ischemia - 04/30/2000, s/p BTL - 04/01/2000  -s/p St. Jude DDD (dual-mode, dual-pacing, dual-sensing) pacemaker for complete AV block 1998  -atril fibrillation (Dx 10/2010)     -s/p failed cardioversion (10/2010) now on coumadin      -Pradaxa tried by [facility] but stopped due to  cost   Other CV:  -Hypertension.  -Hyperlipidemia. -Continued cigarette use. -h/o dyspepsia   Past problems: -h/o angioedema due to ACEI -h/o RLS (Dx 06/2007)  Past Surgical History: Last updated: 07/23/2010 Cardiac cath 5/09: normal angiography with LVH c/w hypertrophic CM. Normal PAW of 6.   Cardiolite 9/00 - neg ischemia - 04/30/2000,  s/p BTL - 04/01/2000,  s/p pacemaker placement -1998   echo 05/30/10: Left ventricle: The cavity size was moderately dilated. Wall     thickness was increased in a pattern of moderate LVH. Systolic     function was normal. The estimated ejection  fraction was in the     range of 55% to 60%.  Vital Signs:  Patient profile:   65 year old female Height:      65 inches Weight:      177 pounds BMI:     29.56 Pulse rate:   62 / minute Resp:     18 per minute BP sitting:   130 / 84  (left arm)  Vitals Entered By: Burnett Kanaris (February 28, 2011 12:12 PM)  Physical Exam  General:  Well nourished, well developed, in no acute distress HEENT: normal Neck: no JVD Cardiac:  normal S1, S2; RRR; no murmur; no S3 Lungs: CTA bilat.; no wheeze or rales Abd: soft, nontender, no hepatomegaly Ext: trace bilateral edema Skin: warm and dry Neuro:  CNs 2-12 intact, no focal abnormalities noted    PPM Specifications Following MD:  Thompson Grayer, MD     PPM Vendor:  St Jude     PPM Model Number:  [id number]     PPM Serial Number:  [id number] PPM DOI:  11/17/2003     PPM Implanting MD:  Vanna Scotland. [name], MD  Lead 1    Location: [facility]     DOI: 07/08/1985     Model #: [id number]     Serial #: [id number]     Status: active Lead 2    Location: [facility]     DOI: 07/08/1985     Model #: [id number]     Serial #: [id number]     Status: active   Indications:  Complete Heart Block   PPM Follow Up Pacer Dependent:  Yes      Episodes Coumadin:  No  Parameters Mode:  DDDR     Lower Rate Limit:  70     Upper Rate Limit:  120 Paced AV Delay:  170     Sensed AV Delay:  150  Impression & Recommendations:  Problem # 1:  ACUTE ON CHRONIC DIASTOLIC HEART FAILURE (A999333) I think her volume is improved although with just a 3 pound weight loss.  Continue current meds and repeat bmet and bnp today.  If bnp coming down, I will make no changes.  She can follow up as planned next month with Dr. Rayann Heman or sooner as needed.  Problem # 2:  GI BLEEDING (ICD-578.9) F/u with GI first week of April.  Problem # 3:  ATRIAL FIBRILLATION (ICD-427.31) Coumadin placed on hold for recent GI bleed.  Orders: TLB-BMP (Basic Metabolic Panel-BMET) (99991111) TLB-BNP (B-Natriuretic Peptide)  (83880-BNPR)  Problem # 4:  HYPERTENSION, BENIGN SYSTEMIC (ICD-401.1) Controlled.  Patient Instructions: 1)  Your physician recommends that you schedule a follow-up appointment in: [place] DR. ALLRED IN APRIL.... 2)  Your physician recommends that you return for lab work in: [place] BMET 428.33, BNP 428.33

## 2011-03-05 NOTE — H&P (Signed)
NAMESUCCESS, DELLES NO.:  [id number]  MEDICAL RECORD NO.:  [id number]           PATIENT TYPE:  I  LOCATION:  [id number]                         FACILITY:  [facility]  PHYSICIAN:  Dalbert Mayotte, MD        DATE OF BIRTH:  October 09, 1946  DATE OF ADMISSION:  02/06/2011 DATE OF DISCHARGE:                             HISTORY & PHYSICAL   PRIMARY CARE PROVIDER:  Karen Kays, MD, at [facility].  CHIEF COMPLAINT:  Shortness of breath.  HISTORY OF PRESENT ILLNESS:  The patient presents complaining of shortness of breath and fatigue that has been going on for 2 weeks.  She says that she has to stop and rest when she walks from the living room to her bedroom.  She denies chest pain associated with the shortness of breath.  The patient denies fevers or chills but endorses a cough.  The patient is a poor historian and has difficulty explaining her symptoms, but she complains of bloating every time she eats and intermittent constipation.  She says she has not had regular bowel movements for some time but is unable to say how long.  She also says that she has had dark black stools and some blood on the tissue when she wipes that she thought was from straining.  The patient denies any urinary symptoms.  PAST MEDICAL HISTORY:  Significant for: 1. Hyperlipidemia. 2. Depression. 3. Hypertension. 4. Atrial fibrillation. 5. Chronic diastolic heart failure. 6. Chronic CKD, stage III. 7. Arthritis. 8. Sleep apnea. 9. Status post cardiac pacemaker. 10.Asthma, chronic. 11.Tobacco dependence.  PAST SURGICAL HISTORY:  Significant for a tubal ligation.  FAMILY HISTORY:  Noncontributory.  SOCIAL HISTORY:  The patient is divorced.  The patient has a long history of smoking but has been trying to quit.  She is smoking about 4 cigarettes daily.  The patient drinks alcohol occasionally about three drinks per week.  The patient lives alone.  ALLERGIES:  ACE INHIBITORS,  reaction is angioedema.  MEDICATIONS: 1. Tylenol 325 mg p.o. q.6 h. p.r.n. for pain. 2. Albuterol inhaler 2 puffs inhaled q.4 h. p.r.n. for shortness of     breath. 3. Xanax 0.5 mg p.o. nightly p.r.n. anxiety. 4. Amiodarone 200 mg p.o. daily. 5. Aspirin 81 mg p.o. daily. 6. Coreg 12.5 mg p.o. b.i.d. 7. Diltiazem 180 mg p.o. daily. 8. Flovent inhaler 2 puffs b.i.d. 9. Lasix 40 mg p.o. daily. 10.Multivitamin 1 tablet p.o. daily. 11.Omeprazole 20 mg p.o. daily. 12.Potassium chloride 20 mEq p.o. daily. 13.Crestor 10 mg p.o. daily. 14.Coumadin 5 mg p.o. daily.  REVIEW OF SYSTEMS:  Negative besides mentioned in HPI.  PHYSICAL EXAMINATION:  VITAL SIGNS:  Temperature 98.1, pulse 70, blood pressure 126/80, respiratory rate 15, pulse ox 100% on room air. HEAD, EYES, EARS, NOSE, AND THROAT:  Pupils are equal, round, reactive to light.  Extraocular movements intact.  There is scleral icterus present.  Pharynx nonerythematous.  Mucous membranes are pale and dry. HEART:  Regular rate and rhythm.  No murmurs, rubs, or gallops. LUNGS:  Normal respiratory effort.  Lungs are clear to auscultation bilaterally.  No crackles or wheezes. ABDOMEN:  Soft, nontender.  No guarding.  Positive bowel sounds. EXTREMITIES:  Negative edema.  Range of motion is grossly intact. SKIN:  No sores or suspicious lesions, rashes, or color changes. NEUROLOGY:  The patient is alert and oriented x3.  Cranial nerves II through XII are intact. RECTAL:  Normal tone.  No impaction, no gross blood.  LABORATORY DATA:  Chemistry:  Sodium 137, potassium 6.5, chloride 106, CO2 is 22, BUN 46, creatinine 4.69, glucose 111, calcium 9.2.  Alk phos 43, AST 20, ALT 15, total bilirubin 0.6.  CBC:  White blood cell count 3.9, hemoglobin 5.2, hematocrit 17.8, platelets 294.  INR 1.39.  Lipase 39.  BNP 133.0.  Lactic acid 1.0.  Hemoccult is positive.  Urinalysis is positive for leukocytes.  Micro shows a few epi, 21-50 white  blood cells, and few bacteria.  EKG shows a paced rhythm with a regular rate. Chest x-ray shows cardiomegaly and increased pulmonary vasculature that is unchanged from previous.  ASSESSMENT AND PLAN:  This is a 65 year old female who presents with shortness of breath and found to have hemoglobin of 5.2 and creatinine of 4.69. 1. Shortness of breath.  The patient with normal pulmonary exam and     imaging.  This is most likely due to anemia.  We will continue her     home asthma medications and monitor for pulmonary edema/fluid     overload. 2. Anemia.  The patient was positive Hemoccult in the ED per recent     colonoscopy.  We will transfuse the patient 2 units of blood and     follow up hemoglobin, and we will consult Gastroenterology for a     workup for GI bleed. 3. Acute-on-chronic renal failure.  The patient's baseline is 1.2-1.6.     This is likely prerenal due to dehydration but the kidney function     may also be worsening and contributing to the patient's anemia.  We     will hydrate the patient overnight and monitor her creatinine.  We     will consider a Renal consult.  If this does not improve, we will     send FENa labs. 4. Cardiovascular:     a.     Atrial fibrillation.  The patient is rate controlled here in      the ED.  We will continue home meds.     b.     Diastolic heart failure.  Again continue home meds.  No      signs of fluid overload, but we will monitor for increasing      respiratory status or lower extremity edema.     c.     Hypertension.  The patient was actually hypotensive on      admission; however, due to congestive heart failure and atrial      fibrillation, we will continue home meds, amiodarone, Coreg,      diltiazem, and Lasix but put hold parameters for hypotension.     d.     Anticoagulation.  We will hold Coumadin and aspirin due to      bleeding.  The patient is on the for her atrial fibrillation. 5. Hyperlipidemia.  We will continue home  statin. 6. Fluids, electrolytes, and nutrition/gastrointestinal.  We will     hydrate the patient with normal saline at 100 mL/hour after she     receives her blood and we will make her n.p.o., she may require     procedure, and we will recheck a BMET to  evaluate her potassium as     it was elevated in the ED.  We will give Kayexalate if it remains     elevated.  We will continue her home proton pump inhibitor for GI     prophylaxis. 7. Sequential compression devices for deep venous thrombosis     prophylaxis due to bleeding. 8. Disposition is pending clinical improvement.    ______________________________ Cletus Gash, MD   ______________________________ Dalbert Mayotte, MD    CR/MEDQ  D:  02/06/2011  T:  02/07/2011  Job:  [id number]  Electronically Signed by Cletus Gash MD on 02/23/2011 12:09:01 PM Electronically Signed by Dalbert Mayotte MD on 03/05/2011 08:25:56 AM

## 2011-03-07 ENCOUNTER — Other Ambulatory Visit (INDEPENDENT_AMBULATORY_CARE_PROVIDER_SITE_OTHER): Payer: PRIVATE HEALTH INSURANCE | Admitting: *Deleted

## 2011-03-07 DIAGNOSIS — R0602 Shortness of breath: Secondary | ICD-10-CM

## 2011-03-07 DIAGNOSIS — Z79899 Other long term (current) drug therapy: Secondary | ICD-10-CM

## 2011-03-07 DIAGNOSIS — I509 Heart failure, unspecified: Secondary | ICD-10-CM

## 2011-03-07 DIAGNOSIS — I5032 Chronic diastolic (congestive) heart failure: Secondary | ICD-10-CM

## 2011-03-07 LAB — BASIC METABOLIC PANEL
CO2: 27 mEq/L (ref 19–32)
Calcium: 9.5 mg/dL (ref 8.4–10.5)
Chloride: 104 mEq/L (ref 96–112)
Glucose, Bld: 104 mg/dL — ABNORMAL HIGH (ref 70–99)
Potassium: 4.3 mEq/L (ref 3.5–5.1)
Sodium: 141 mEq/L (ref 135–145)

## 2011-03-17 ENCOUNTER — Ambulatory Visit (INDEPENDENT_AMBULATORY_CARE_PROVIDER_SITE_OTHER): Payer: PRIVATE HEALTH INSURANCE | Admitting: Internal Medicine

## 2011-03-17 ENCOUNTER — Encounter: Payer: Self-pay | Admitting: Internal Medicine

## 2011-03-17 ENCOUNTER — Other Ambulatory Visit (INDEPENDENT_AMBULATORY_CARE_PROVIDER_SITE_OTHER): Payer: PRIVATE HEALTH INSURANCE

## 2011-03-17 VITALS — BP 122/70 | HR 60 | Ht 65.0 in | Wt 176.8 lb

## 2011-03-17 DIAGNOSIS — K5901 Slow transit constipation: Secondary | ICD-10-CM

## 2011-03-17 DIAGNOSIS — K219 Gastro-esophageal reflux disease without esophagitis: Secondary | ICD-10-CM

## 2011-03-17 DIAGNOSIS — D509 Iron deficiency anemia, unspecified: Secondary | ICD-10-CM

## 2011-03-17 LAB — CBC WITH DIFFERENTIAL/PLATELET
Basophils Relative: 0 % (ref 0.0–3.0)
Eosinophils Relative: 3 % (ref 0.0–5.0)
HCT: 31.8 % — ABNORMAL LOW (ref 36.0–46.0)
Hemoglobin: 10.3 g/dL — ABNORMAL LOW (ref 12.0–15.0)
MCV: 88.8 fl (ref 78.0–100.0)
Monocytes Absolute: 0.6 10*3/uL (ref 0.1–1.0)
Neutro Abs: 2.7 10*3/uL (ref 1.4–7.7)
Neutrophils Relative %: 63.7 % (ref 43.0–77.0)
RBC: 3.58 Mil/uL — ABNORMAL LOW (ref 3.87–5.11)
WBC: 4.2 10*3/uL — ABNORMAL LOW (ref 4.5–10.5)

## 2011-03-17 NOTE — Patient Instructions (Addendum)
Your physician has requested that you go to the basement for the following lab work before leaving today: CBC [facility] Continue iron supplements Repeat colonoscopy in 8 /2014

## 2011-03-17 NOTE — Progress Notes (Signed)
[name] 20-Dec-1945 MRN [id number]   History of Present Illness:  This is a 65 year old African American female, discharged from the hospital after evaluation for severe anemia. She had a hemoglobin of 5.2 on 02/06/2011. She was transfused 3 units of packed cells. Her last hemoglobin on February 13, 2011 was 8.2. She is on iron supplements including ferrous sulfate 325 mg by mouth twice a day. Additional problems are chronic renal insufficiency, aortic stenosis, permanent transvenous pacemaker for AV block inserted in 1986, congestive heart failure. She is a smoker. She also has fatty liver. GI evaluation has included an upper endoscopy in August of 2011 and showing mild esophageal stricture. She was dilated with a 48 [place] Maloney dilator. She had gastritis. A colonoscopy in August 2011 for family history of colon cancer in 2 brothers and a personal history of adenomatous polyps in 2005 showed a polyp which was a tubular adenoma. She has no specific complaints today other than severe constipation for which she takes milk of magnesia. We have sent her Senokot 2 tablets at bedtime but she feels it hasn't helped. We have also tried MiraLax but she doesn't like to take it.   Past Medical History  Diagnosis Date  . Angioedema     2/2 ACE  . Tobacco dependence     Quit January 2012; tried 1 cigarette March 2012, did not enjoy,  resolved to stay quit.   [name] RLS (restless legs syndrome)     Dx 06/2007  . Anemia   . Arteriovenous malformation of stomach   . History of atrial fibrillation   . Renal failure   . Hyperlipidemia   . Depression   . Diastolic heart failure   . Arthritis   . Sleep apnea   . Asthma   . Hx of colonic polyp 08/13/10    adenomatous  . Fatty liver 07/26/10  . Panic attacks   . Hypertension    Past Surgical History  Procedure Date  . Tubal ligation 04/01/2000  . Pacemaker insertion [place]    reports that she has been smoking Cigarettes.  She has never  used smokeless tobacco. She reports that she drinks about 1.5 ounces of alcohol per week. She reports that she does not use illicit drugs. family history includes Cancer in her brother; Colon cancer in her brother; Diabetes in her brother and sister; Heart attack in her brother and father; Heart disease in her mother and sister; Hypertension in her brother, father, mother, and sister; and Stroke in her father and mother. Allergies  Allergen Reactions  . Ace Inhibitors     REACTION: angioedema        Review of Systems: Denies chest pain shortness of breath. She denies dysphagia but is has occasional indigestion and heartburn. Denies abdominal pain. Positive for constipation denies rectal bleeding  The remainder of the 10  point ROS is negative except as outlined in H&P   Physical Exam: General appearance  Well developed in no distress. Eyes- non icteric. HEENT nontraumatic, normocephalic. Mouth no lesions, tongue papillated, no cheilosis. Neck supple without adenopathy, thyroid not enlarged, no carotid bruits, no JVD Lungs Clear to auscultation bilaterally, few expiratory wheezes. Cor normal S1, normal S2, regular rhythm , soft  murmur,  quiet precordium. Abdomen protuberant and nontender with normoactive bowel sounds. No focal tenderness. Liver edge at costal margin.  Rectal: Normal perianal area. Normal rectal sphincter tone. No stool in the rectal vault, mucus stools, Hemoccult negative. Extremities no pedal  edema. Skin no lesions. Neurological alert and oriented x 3. Psychological normal mood and affect.  Assessment and Plan:  Problems #1 Severe constipation. Patient has functional constipation, likely multifactorial. She is on multiple cardiac medications which may be causing constipation. Iron supplements can also contribute to the constipation. She is not physically active. She seemed to like milk of magnesia and I agree that it may be a good way to deal with her constipation.  She will take 45 cc every other day and alternate with Senokot 2 tablets every other day. She is up-to-date on her colonoscopy as is her upper endoscopy. I could not adequately check for blood because she took a laxative before coming for an exam. We will monitor her for continued GI blood loss and if she is Hemoccult positive, then I would recommend small bowel capsule endoscopy at some point. We are checking on her CBC today.   Problem #2 family history of colon cancer. There is a history of colon cancer in 2 brothers and a personal history of adenomatous polyps. A recall colonoscopy will be due in 3-5 years which would be around 2014.  Problem #3- Anemia, multifactorial. This is due to iron deficiency as well as to chronic renal insufficiency. She will continue iron supplements and we are checking her CBC today.      03/17/2011 Delfin Edis

## 2011-03-20 ENCOUNTER — Other Ambulatory Visit: Payer: PRIVATE HEALTH INSURANCE

## 2011-03-28 ENCOUNTER — Encounter: Payer: Self-pay | Admitting: Family Medicine

## 2011-03-28 ENCOUNTER — Ambulatory Visit (INDEPENDENT_AMBULATORY_CARE_PROVIDER_SITE_OTHER): Payer: PRIVATE HEALTH INSURANCE | Admitting: Family Medicine

## 2011-03-28 DIAGNOSIS — Z720 Tobacco use: Secondary | ICD-10-CM

## 2011-03-28 DIAGNOSIS — R5383 Other fatigue: Secondary | ICD-10-CM

## 2011-03-28 DIAGNOSIS — E785 Hyperlipidemia, unspecified: Secondary | ICD-10-CM

## 2011-03-28 DIAGNOSIS — F17200 Nicotine dependence, unspecified, uncomplicated: Secondary | ICD-10-CM

## 2011-03-28 DIAGNOSIS — F411 Generalized anxiety disorder: Secondary | ICD-10-CM

## 2011-03-28 DIAGNOSIS — R5381 Other malaise: Secondary | ICD-10-CM

## 2011-03-28 NOTE — Patient Instructions (Signed)
Please continue taking your amiodarone for now. Please mention to your cardiologist next week that it is making you tired. Your liver, kidney, and thyroid tests done recently were normal.  Please see me again in 2 months to talk about your move, anxiety.  If you want to talk about smoking cessation, please call me.  And I encourage you to eat lots of green leafy vegetables.

## 2011-03-30 ENCOUNTER — Encounter: Payer: Self-pay | Admitting: Family Medicine

## 2011-03-30 NOTE — Assessment & Plan Note (Signed)
Smoking again.  Denies pharmacology appointment. Knows she has to quit and feels she is able but not interested in making concrete plans at this time. Discussed with patient who is aware of harmful effects of tobacco in light of her many medical problems.

## 2011-03-30 NOTE — Assessment & Plan Note (Signed)
Still occasional (once or twice a week) use of BDZ. But anxiety seems to be worse than previously especially with stress of moving out of current house and problems with her children whom she feels is taking advantage of her.  Not interested in seeing a therapist at this time.  Denies depression.  Will talk about this more in depth at next visit in 1 month.

## 2011-03-30 NOTE — Assessment & Plan Note (Signed)
Concerned may be due to amiodarone.  But fortunately, all recent labs normal including TSH, LFT.  Would consider getting PFT in future if problem persists. Nothing was done about amiodarone dosage at this time since patient has follow-up with Cardiologist in 1 week.

## 2011-03-30 NOTE — Progress Notes (Signed)
  Subjective:    Patient ID: [name], female    DOB: Aug 11, 1946, 65 y.o.   MRN: [id number]  HPI Follow-up of several issues:  1. Smoking Started smoking again. Now 2-3 cigarettes a day.  Expresses wishes to quit but does not want to make any definite plans (i.e., quit date) at this time.  2. Anemia likely 2/2 UGIB likely 2/2 AVM Most recent Hgb on 04/02 improved @ 10.3 (had been 8 previously).  Recent visit with Dr. [name]. Anemia is stable and no signs of UGIB.  ROS negative: denies dizziness, blood in spit, blood in stool.   3. HLD Reports compliance with statin.  ROS: denies myalgias, abdominal pain, nausea/vomiting; + constipation and takes milk or magnesia and/or senokot as needed  4. Anxiety/depression Has been under more stress lately. Planning on moving out of her house to different one. [place] and patient worries about her car having to make the climb. Also, patient currently living with her daughter. Feels daughter is taking advantage of her since patient is the one who pays the rent.  Patient has mixed feelings about the move. Feels like abandoning her daughter and her family in a way but at the same time, is tired of her daughter taking advantage of the patient's financial situation. Patient is also anxious about moving into a new house and living by herself but feels like this is the best decision for her at this time. Plans on moving in about a month.  Patient is involved in her church, a [employer] in town. To help manage her stress, the patient likes to have friends over. She also watches a lot of television during the day.  ROS: denies feeling depressed  5. Cardiac: chronic diastolic HF, AF (previously on coumadin but no longer taking 2/2 UGIB), s/p pacemaker Reports compliance with ASA 81, Coreg 12.5 bid, and diltiazem. Also taking amiodarone but started splitting the tablet and taking it in 2 doses since she feels it makes her  feel tired/low energy after taking it.  This feeling lasts for a few hours. Not associated with activity.  Recent TSH on 02/12 was normal and recent LFTs have been normal.  Not sure if she's had a recent or any PFTs done.  ROS: denies any blurred vision or other vision changes; occasional chest palpitations; no chest pain/recent falls/dyspnea.   Review of Systems     Objective:   Physical Exam  Constitutional: No distress.  Neck: No thyromegaly present.  Cardiovascular:       II/VI systolic murmur heard best @ LSB  Pulmonary/Chest: Effort normal and breath sounds normal. No respiratory distress. She has no wheezes. She has no rales. She exhibits no tenderness.  Musculoskeletal: She exhibits no edema.  Psychiatric: She has a normal mood and affect. Her behavior is normal. Judgment and thought content normal.       Patient does not appear depressed and is appropriate to all questions.           Assessment & Plan:

## 2011-03-30 NOTE — Assessment & Plan Note (Addendum)
Deteriorated. Direct LDL is now 140. Will ask patient to double up on statin for now. Goal is <100. Patient has been through a lot recently (hosptialization for UGIB) and these life issues may have contributed to deterioration in healthy lifestyle habits. Will try to ask more about this next visit in 1 month.

## 2011-03-31 ENCOUNTER — Telehealth: Payer: Self-pay | Admitting: *Deleted

## 2011-03-31 NOTE — Telephone Encounter (Signed)
Left message on patients voicemail informing her of increased LDL and to double Crestor, asked that she call back with any questions.

## 2011-03-31 NOTE — Telephone Encounter (Signed)
Message copied by Levert Feinstein on Mon Mar 31, 2011  8:46 AM ------      Message from: [facility], ANGELA      Created: Sun Mar 30, 2011  3:57 PM       Will you please call patient and inform her that her LDL is elevated (140). Her goal is <100 where it has been before. Will you ask her to double up on her rosuvastatin (Crestor) 10mg , so that she will take 2 tablets once a day preferably around bedtime? Thank you.

## 2011-04-03 ENCOUNTER — Ambulatory Visit (INDEPENDENT_AMBULATORY_CARE_PROVIDER_SITE_OTHER): Payer: PRIVATE HEALTH INSURANCE | Admitting: Internal Medicine

## 2011-04-03 ENCOUNTER — Encounter: Payer: Self-pay | Admitting: *Deleted

## 2011-04-03 ENCOUNTER — Encounter: Payer: Self-pay | Admitting: Internal Medicine

## 2011-04-03 VITALS — BP 120/84 | HR 64 | Ht 67.0 in | Wt 176.0 lb

## 2011-04-03 DIAGNOSIS — Z720 Tobacco use: Secondary | ICD-10-CM

## 2011-04-03 DIAGNOSIS — I10 Essential (primary) hypertension: Secondary | ICD-10-CM

## 2011-04-03 DIAGNOSIS — I509 Heart failure, unspecified: Secondary | ICD-10-CM

## 2011-04-03 DIAGNOSIS — F17200 Nicotine dependence, unspecified, uncomplicated: Secondary | ICD-10-CM

## 2011-04-03 DIAGNOSIS — K922 Gastrointestinal hemorrhage, unspecified: Secondary | ICD-10-CM

## 2011-04-03 DIAGNOSIS — I4891 Unspecified atrial fibrillation: Secondary | ICD-10-CM

## 2011-04-03 DIAGNOSIS — I442 Atrioventricular block, complete: Secondary | ICD-10-CM

## 2011-04-03 DIAGNOSIS — I5032 Chronic diastolic (congestive) heart failure: Secondary | ICD-10-CM

## 2011-04-03 NOTE — Assessment & Plan Note (Signed)
Improving  No changes today Salt restriction advised

## 2011-04-03 NOTE — Assessment & Plan Note (Signed)
She has previously failed cardioversion and is in an atypical atrial flutter today.  We will likely stop amiodarone in the near future.  She will continue 100mg  daily at this time.   Coumadin is on hold due to recent bleeding.

## 2011-04-03 NOTE — Assessment & Plan Note (Signed)
Improved off coumadin

## 2011-04-03 NOTE — Assessment & Plan Note (Signed)
Her pacemaker has reached ERI.  She is pacemaker dependant and requires generator change.  Risks, benefits, alternatives to pacemaker pulse generator replacement were discussed in detail with the patient today. The patient understands that the risks include but are not limited to bleeding, infection, renal failure, MI, stroke, death, and damage to her pacing leads possibly requiring lead revision and wishes to proceed. We will therefore schedule the procedure at the next available time.

## 2011-04-03 NOTE — Assessment & Plan Note (Signed)
Cessation encouraged 

## 2011-04-03 NOTE — Progress Notes (Signed)
The patient presents today for routine electrophysiology followup.  Since last being seen in our clinic, the patient reports doing very well.  Her SOB has improved.  She has recently decreased amiodarone to 100mg  daily and feels that this has helped.  Based on recent labs, her anemia is also improving.  Today, she denies symptoms of palpitations, chest pain, orthopnea, PND, lower extremity edema, dizziness, presyncope, syncope, or neurologic sequela.  The patient feels that she is tolerating medications without difficulties and is otherwise without complaint today.   Past Medical History  Diagnosis Date  . Angioedema     2/2 ACE  . Tobacco dependence     Quit January 2012; tried 1 cigarette March 2012, did not enjoy,  resolved to stay quit.   [name] RLS (restless legs syndrome)     Dx 06/2007  . Anemia   . Arteriovenous malformation of stomach   . Atrial fibrillation   . Renal failure   . Hyperlipidemia   . Depression   . Diastolic heart failure   . Arthritis   . Sleep apnea   . Asthma   . Hx of colonic polyp 08/13/10    adenomatous  . Fatty liver 07/26/10  . Panic attacks   . Hypertension   . Complete heart block     s/p PPM 1998  . Hypertrophic cardiomyopathy     dx by Dr [name] 2009   Past Surgical History  Procedure Date  . Tubal ligation 04/01/2000  . Pacemaker insertion 1998    [facility]    Current Outpatient Prescriptions  Medication Sig Dispense Refill  . albuterol (PROAIR HFA) 108 (90 BASE) MCG/ACT inhaler two puffs every 4-6 hours as needed       . ALPRAZolam (XANAX) 0.5 MG tablet Take 0.5 mg by mouth at bedtime as needed. For anxiety       . amiodarone (PACERONE) 200 MG tablet Take 200 mg by mouth daily.       [name] aspirin 81 MG EC tablet Take 81 mg by mouth daily.        . carvedilol (COREG) 6.25 MG tablet Take 12.5 mg by mouth 2 (two) times daily with a meal.       . diltiazem (CARDIZEM) 90 MG tablet Take 90 mg by mouth daily.        . ferrous sulfate 325 (65 FE) MG  tablet Take 325 mg by mouth. Take 1 tablet by mouth twice daily       . furosemide (LASIX) 40 MG tablet Take 40 mg by mouth daily.        . Olopatadine HCl (PATADAY) 0.2 % SOLN Apply 1 drop to eye daily.  1 Bottle  5  . Omega-3 Fatty Acids (FISH OIL PO) Take by mouth. Take 2 capsules by mouth daily       . omeprazole (PRILOSEC) 20 MG capsule Take 20 mg by mouth 2 (two) times daily.       . rosuvastatin (CRESTOR) 10 MG tablet Take 10 mg by mouth daily.        [name] DISCONTD: Multiple Vitamins-Minerals (CENTRUM) tablet Take 1 tablet by mouth daily.        [name] DISCONTD: rosuvastatin (CRESTOR) 20 MG tablet Take 1 tablet (20 mg total) by mouth at bedtime.  30 tablet  11  . DISCONTD: senna (SENOKOT) 8.6 MG tablet Take 1 tablet by mouth 2 (two) times daily.  60 tablet  3  . DISCONTD: senna (SENOKOT) 8.6 MG tablet Take  2 tablets by mouth every night        Allergies  Allergen Reactions  . Ace Inhibitors     REACTION: angioedema    History   Social History  . Marital Status: Divorced    Spouse Name: N/A    Number of Children: 38  . Years of Education: N/A   Occupational History  . disabled    Social History Main Topics  . Smoking status: Current Everyday Smoker -- 0.1 packs/day    Types: Cigarettes    Last Attempt to Quit: 02/23/2011  . Smokeless tobacco: Never Used   Comment: Made decision to quit after hospitalization. Relapsed a few days ago and had one "drag" but found it unpleasant. Has not smoked since then.   . Alcohol Use: 1.5 oz/week    3 drink(s) per week  . Drug Use: No  . Sexually Active: Not on file   Other Topics Concern  . Not on file   Social History Narrative   On disability for heart failure/pacemaker.  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 4 grown children,  8 grandchildren.  Drinks alcohol a few times a week socially. At one time, the most she reports drinking is about 3 mixed drinks.Lives with daughter [name] and son. Will be moving 04/2011 to  different house because concerned children are taking advantage of her financial situation. No illicit drugs.Smokes few cigarettes daily, especially when stressed. Started smoking @ age 75. Quit January 2012 2/2 health but started smoking again 02/2011.     Family History  Problem Relation Age of Onset  . Hypertension Mother   . Stroke Mother   . Hypertension Father   . Stroke Father   . Hypertension Sister   . Hypertension Brother   . Colon cancer Brother   . Diabetes Sister   . Diabetes Brother   . Cancer Brother   . Heart attack Brother   . Heart attack Father   . Heart disease Mother   . Heart disease Sister     ROS-  All systems are reviewed and are negative except as outlined in the HPI above    Physical Exam: Filed Vitals:   04/03/11 1530  BP: 120/84  Pulse: 64  Height: 5\' 7"  (1.702 m)  Weight: 176 lb (79.833 kg)    GEN- The patient is well appearing, alert and oriented x 3 today.   Head- normocephalic, atraumatic Eyes-  Sclera clear, conjunctiva pink Ears- hearing intact Oropharynx- clear Neck- supple, no JVP Lymph- no cervical lymphadenopathy Lungs- Clear to ausculation bilaterally, normal work of breathing Chest- pacemaker pocket is well healed Heart- Regular rate and rhythm (paced), 2/6 SEM LUSB, rubs or gallops, PMI not laterally displaced GI- soft, NT, ND, + BS Extremities- no clubbing, cyanosis, +1 edema MS- no significant deformity or atrophy Skin- no rash or lesion Psych- euthymic mood, full affect Neuro- strength and sensation are intact  Pacemaker interrogation- reviewed in detail today,  See PACEART report, she has reached ERI.  Her underlying rhythm is atypical atrial flutter.  Assessment and Plan:

## 2011-04-03 NOTE — Assessment & Plan Note (Signed)
Stable No changes 

## 2011-04-03 NOTE — Patient Instructions (Signed)
Will schedule a generator change for 04/08/11

## 2011-04-04 LAB — BASIC METABOLIC PANEL
BUN: 37 mg/dL — ABNORMAL HIGH (ref 6–23)
CO2: 28 mEq/L (ref 19–32)
Calcium: 9.6 mg/dL (ref 8.4–10.5)
Glucose, Bld: 84 mg/dL (ref 70–99)
Sodium: 142 mEq/L (ref 135–145)

## 2011-04-04 LAB — CBC WITH DIFFERENTIAL/PLATELET
Basophils Absolute: 0 10*3/uL (ref 0.0–0.1)
Eosinophils Absolute: 0.1 10*3/uL (ref 0.0–0.7)
HCT: 34.3 % — ABNORMAL LOW (ref 36.0–46.0)
Hemoglobin: 11.4 g/dL — ABNORMAL LOW (ref 12.0–15.0)
Lymphocytes Relative: 24.3 % (ref 12.0–46.0)
Lymphs Abs: 0.9 10*3/uL (ref 0.7–4.0)
MCHC: 33.1 g/dL (ref 30.0–36.0)
Neutro Abs: 2 10*3/uL (ref 1.4–7.7)
RDW: 19.9 % — ABNORMAL HIGH (ref 11.5–14.6)

## 2011-04-07 ENCOUNTER — Encounter: Payer: Self-pay | Admitting: Family Medicine

## 2011-04-07 DIAGNOSIS — R5383 Other fatigue: Secondary | ICD-10-CM

## 2011-04-07 DIAGNOSIS — Z950 Presence of cardiac pacemaker: Secondary | ICD-10-CM

## 2011-04-08 ENCOUNTER — Ambulatory Visit (HOSPITAL_COMMUNITY)
Admission: RE | Admit: 2011-04-08 | Discharge: 2011-04-08 | Disposition: A | Discharge status: Home / Self Care | Payer: PRIVATE HEALTH INSURANCE | Source: Ambulatory Visit | Attending: Internal Medicine | Admitting: Internal Medicine

## 2011-04-08 ENCOUNTER — Other Ambulatory Visit: Payer: Self-pay | Admitting: Internal Medicine

## 2011-04-08 DIAGNOSIS — Z01818 Encounter for other preprocedural examination: Secondary | ICD-10-CM | POA: Insufficient documentation

## 2011-04-08 DIAGNOSIS — Z45018 Encounter for adjustment and management of other part of cardiac pacemaker: Secondary | ICD-10-CM | POA: Insufficient documentation

## 2011-04-08 DIAGNOSIS — I442 Atrioventricular block, complete: Secondary | ICD-10-CM

## 2011-04-09 ENCOUNTER — Telehealth: Payer: Self-pay | Admitting: Internal Medicine

## 2011-04-09 NOTE — Telephone Encounter (Signed)
Spoke with daughter, [name] and had patient remove dressing from site and leave steri strips intact.

## 2011-04-09 NOTE — Telephone Encounter (Signed)
Trying to remove bandage placed over PM site while in hosp but patients skin is tearing and in a lot of pain.  What should she do since can't get site wet for 2 wks.

## 2011-04-09 NOTE — Telephone Encounter (Signed)
Pt's dtr veronica calling to see if we have samples of fluid pill until pt gets her check the first of the month

## 2011-04-09 NOTE — Telephone Encounter (Signed)
Spoke with pt and left some Furosemide out front for pt that I got from Coumadin clinic

## 2011-04-16 ENCOUNTER — Ambulatory Visit: Payer: Self-pay | Admitting: Cardiovascular Disease

## 2011-04-17 ENCOUNTER — Ambulatory Visit (INDEPENDENT_AMBULATORY_CARE_PROVIDER_SITE_OTHER): Payer: PRIVATE HEALTH INSURANCE | Admitting: *Deleted

## 2011-04-17 DIAGNOSIS — I442 Atrioventricular block, complete: Secondary | ICD-10-CM

## 2011-04-17 NOTE — Progress Notes (Signed)
Wound check

## 2011-04-22 NOTE — Op Note (Addendum)
[name], [name]       ACCOUNT NO.:  [id number]  MEDICAL RECORD NO.:  [id number]           PATIENT TYPE:  O  LOCATION:  XRAY                         FACILITY:  [place]  PHYSICIAN:  Thompson Grayer, MD       DATE OF BIRTH:  1946/05/06  DATE OF PROCEDURE:  04/08/2011 DATE OF DISCHARGE:  04/08/2011                              OPERATIVE REPORT   SURGEON:  Thompson Grayer, MD  PREPROCEDURE DIAGNOSES: 1. Complete heart block. 2. Pacemaker at elective replacement indicator.  POSTPROCEDURE DIAGNOSES: 1. Complete heart block. 2. Pacemaker at elective replacement indicator.  PROCEDURES: 1. Temporary and transvenous pacemaker placement from the right common     femoral vein. 2. Pacemaker pulse generator replacement for ERI battery status. 3. Pocket revision.  INTRODUCTION:  [name] is a very pleasant 65 year old female with a history of complete heart block status post prior pacemaker implantation who has reached ERI battery status.  She presents today for pacemaker pulse generator replacement.  Her initial pacemaker leads were implanted in 1986.  She is pacemaker dependent without an underlying R- wave observed.  She presents today for pacemaker pulse generator replacement.  DESCRIPTION OF PROCEDURE:  Informed written consent was obtained and the patient was brought to the Electrophysiology Lab in the fasting state. She was adequately sedated with intravenous Versed and fentanyl as outlined in the nursing report.  The patient's pacemaker was interrogated and confirmed to be at [facility] battery status.  There was no underlying R-wave observed.  Due to the age of the patient's pacing leads as well as the fact that she is pacemaker dependent,  I felt it was most prudent to place a temporary pacing wire before pulse generator replacement today.  The patient's right groin was therefore prepped and draped in the usual sterile fashion by the EP Lab staff.  The skin over her  right groin was infiltrated with lidocaine for local analgesia. Using a percutaneous Seldinger technique, one 6-French hemostasis sheath was placed in the right common femoral vein.  A 6-French quadripolar Josephson catheter was introduced through the right common femoral vein and advanced into the right ventricular apex under fluoroscopic visualization.  Ventricular pacing was then performed through this temporary lead.  The patient's right chest was then prepped and draped in the usual sterile fashion by the EP lab staff.  The skin overlying existing pacemaker pocket was infiltrated with lidocaine for local analgesia.  A 4-cm incision was made over the existing pacemaker.  Using a combination of sharp and blunt dissection, the pocket was opened and the pacemaker was exposed.  Electrocautery was required to assure hemostasis.  The pacemaker was carefully removed from the pocket and disconnected from the leads.  The patient's atrial and ventricular leads were examined thoroughly and their integrities were confirmed to be intact.  The atrial lead as confirmed to be a Oscor model X1887502 (serial [id number]) lead implanted on July 08, 1985.  The ventricular lead was confirmed to be a Event organiser, model 851S-60 (serial [id number]) lead also implanted on July 08, 1985.  These were MS5 mm leads.  After their integrity was confirmed to be intact, electrograms were recorded through  the analyzer which revealed that the patient was in atrial flutter with flutter waves measuring 0.8 mV and an atrial lead impedance of 313 ohms.  Right ventricular lead R-waves with pacing measured 11 mV, however, there were no underlying R-waves without temporary pacing. Impedance was 381 ohms and a threshold was 0.8 volts at 0.4 milliseconds.  Both leads were secured to a [place] ADx XL DR, model 5357M-S (serial [id number]) pacemaker.  The pacemaker pocket was revised to accommodate this new device.  The  pocket was irrigated with copious gentamicin solution.  The pacemaker was then placed into the pocket.  The pocket was then closed in 2 layers with 2.0 Vicryl suture for the subcutaneous and subcuticular layers.  Steri-Strips and a sterile dressing were then applied.  Carefully under fluoroscopic visualization, the Bard temporary pacing electrode was removed from the body.  The procedure was therefore considered completed.  The right ventricular sheath was aspirated and flushed and removed in a standard fashion.  There were no early apparent complications.  The procedure today required significant increased time and skill due to the patient's pacemaker dependence and the age of her atrial and ventricular leads as well as her underlying comorbidities.  CONCLUSIONS: 1. Successful pacemaker pulse generator replacement for ERI battery     status with a [place]ity ADx XL DR pacemaker. 2. No early apparent complications.     Thompson Grayer, MD   ______________________________ Thompson Grayer, MD    JA/MEDQ  D:  04/08/2011  T:  04/09/2011  Job:  [id number]  cc:   Karen Kays, MD  Electronically Signed by Thompson Grayer MD on 05/09/2011 05:37:00 PM

## 2011-04-23 ENCOUNTER — Encounter: Payer: PRIVATE HEALTH INSURANCE | Admitting: Internal Medicine

## 2011-04-29 NOTE — Consult Note (Signed)
NAMELATRIECE, WEHRS NO.:  [id number]   MEDICAL RECORD NO.:  [id number]          PATIENT TYPE:  INP   LOCATION:  2903                         FACILITY:  [facility]   PHYSICIAN:  Loretha Brasil. Lia Foyer, MD, FACCDATE OF BIRTH:  18-Jun-1946   DATE OF CONSULTATION:  DATE OF DISCHARGE:                                 CONSULTATION   CHIEF COMPLAINT:  Shortness of breath and chest pain.   HISTORY OF PRESENT ILLNESS:  This 65 year old female has been followed  by Dr. [name] since 1986.  She has a permanent pacemaker, which has been  upgraded.  She had recently been admitted to the hospital with a  pneumonia last month.  She presented to the emergency room with  increasing shortness of breath after notifying her daughter that she was  having trouble getting her breath.  She was noted in the emergency room  to have a pO2 of 40.  Chest x-ray showed bilateral opacities with  possible pulmonary edema versus pneumonia.  She has some chest  discomfort with inspiratory discomfort involving the left rib margin  slightly worse with inspiration.  The patient has a paced rhythm.  Her  cardiac enzymes have been slightly elevated with elevation in troponin,  but not CK-MB.  Chest x-ray was reviewed with Dr. Titus Mould over the  phone.  She has bilateral opacities, and these are slightly more  prominent at the bases than in the upper lobes, although there is some  redistribution.  Echo was done today and revealed an ejection fraction  of 60-70% with vigorous LV function.  There was some suggestion of  perhaps mild mitral regurgitation with an eccentric jet.  The pain is  not widely radiating.  We first encountered her overall the 2000 units  after she had been transferred.  We notified the family medicine  resident of her current situation where the process of transferring her  back to the intensive care unit for closer monitoring.   Important negatives include the fact that she has not had  fever.  Her  white count has been okay.  She has not had shaking chills.  In the  emergency room, she did get intravenous Solu-Medrol and was given Zosyn.   Her past medical history is remarkable for hypertension.  She has had a  pacemaker for complete AV block in 1986 with subsequent upgrade.  She  continues to use tobacco.  The patient did have angioedema associated  with use of ACE inhibitors during the last admission.   Of note, the patient was also placed on diltiazem having been taken off  of Cardizem or Norvasc by Dr. [name].   However, her current medications are listed as,  1. KCl 20 mEq daily.  2. Hydrochlorothiazide 25 mg daily.  3. Norvasc 10 mg daily.  4. Crestor 10 mg daily.  5. Aspirin 81 mg daily.  6. Albuterol MDI   SOCIAL HISTORY:  She lives with her children.  She does smoke one pack  of cigarettes a day.  She will drink a couple of shots at bedtime.   Family history is remarkable for mother who died of a CVA  and an MI and  father who died of an MI.  She does have siblings with coronary artery  disease.   REVIEW OF SYSTEM:  She did have wheezing and cough.   PHYSICAL EXAMINATION:  GENERAL:  She is somewhat mildly tachypneic.  VITAL SIGNS:  Blood pressure is elevated at 170/80, the temperature is  98, pulse 73, respiratory rate 19, O2 sat is about 92% on 6 liters.  She  is slightly tachypneic.  CHEST:  She is complaining of some chest discomfort in the left rib  margin.  NECK:  Supple.  HEENT:  Examination is unremarkable.  LUNGS:  On examination of the lungs, she has no dullness to percussion.  Importantly, she does have bilateral expiratory wheezes, with rhonchi  particularly in the bases.  CARDIAC:  PMI is nondisplaced.  It is difficult to hear the heart sounds  underlying lung pathology, but I do not appreciate a significant mitral  regurgitation murmur.  ABDOMEN:  Soft.  There is trace lower extremity edema.  NEUROLOGIC:  Examination is nonfocal  throughout.   Review of the chest x-ray is as noted in the chart.  I have reviewed  this with Dr. Titus Mould over the phone.  It does show bilateral  opacities, an edema versus pneumonia cannot be excluded.   Electrocardiogram demonstrates atrial tracking with ventricular pacing.   The echocardiogram is as noted in the official report and is previously  summarized in the recent note.   LABORATORY STUDIES:  Include a hemoglobin 10.1, hematocrit of 30.1.  Importantly, the MCV is normal, is not microcytic.  Platelet count is  287,000 and white count is approximately 8,000 with equal number of  polys and lymphs.  Her BUN is 24, creatinine 1.5 with a previous  creatinine that was normal.  Glucose is 129.  K was 3.3 today and  replaced.  AST as 56, ALT 38.  BNP is slightly elevated at 206.  Her CKs  were normal with a CK-MB of 2 and 2.5.  Troponins were mildly elevated  at 0.21 and 0.22.   IMPRESSION:  1. Bilateral air-space disease, question pulmonary edema versus      ongoing pulmonary process.  2. Marked hypoxemia secondary to bilateral air-space disease.  3. Recent pneumonia.  4. Elevated cardiac enzymes of uncertain etiology.  5. Normocytic anemia.  6. Recent pneumonia.   RECOMMENDATIONS:  1. Reduce fluids to [facility].  2. IV furosemide.  3. Heparin drip.  4. Nitroglycerin drip.  5. Transfer back to the intensive care unit.  6. Pulmonary consultation for further evaluation.  7. Continue cardiac enzymes.  8. Agree with treatment for pneumonia.  9. We may consider a CT angio, but I would be reluctant at the present      time due to the patient's elevated creatinine.  She will be      receiving therapy for ischemia and/or possible pulmonary embolism.   ADDENDUM:  I would get a D-dimer.      Loretha Brasil. Lia Foyer, MD, [facility]  Electronically Signed     TDS/MEDQ  D:  05/11/2008  T:  05/12/2008  Job:  [id number]   cc:   Vanna Scotland. [name], MD, [facility]

## 2011-04-29 NOTE — Assessment & Plan Note (Signed)
[facility]                            CARDIOLOGY OFFICE NOTE   [name], [name]                 MRN:          [id number]  DATE:12/20/2008                            DOB:          Feb 11, 1946    PRIMARY CARE PHYSICIAN:  [facility].   CLINICAL HISTORY:  [name] returned for followup management of  her hypertrophic cardiomyopathy, end-diastolic heart failure, and  pacemaker.  She was hospitalized last year with congestive heart failure  and respiratory failure.  We thought was both related to pneumonia and  congestive heart failure.  He has done fairly well since that time.  She  had a catheterization done in the hospital that time, which showed  normal coronaries and showed severe LVH with cavity obliteration.  She  subsequently had an echocardiogram, which has shown severe left  ventricular hypertrophy.   Recently, she has developed symptoms of wheezing and a productive cough.  She started on a cough medicine about 4 days ago, this has not helped.  Unfortunately, she still smoked some, although she states she only  smokes a few cigarettes a day.  Prior to the episode of cough and  wheezing, she was getting along fairly well with the heart.   PAST MEDICAL HISTORY:  Significant for hypertension, hyperlipidemia, and  asthma.   CURRENT MEDICATIONS:  1. Diltiazem CD 180 daily.  2. Metoprolol 50 mg b.i.d.  3. Furosemide 40 mg daily.  4. Potassium 20 mEq daily.  5. Crestor 10 mg daily.   PHYSICAL EXAMINATION:  VITAL SIGNS:  Today, the blood pressure was  135/83 and the pulse 70 and regular.  NECK:  There is no venous distention.  The carotid pulses were full  without bruits.  CHEST:  Had scattered wheezes and a few rales.  CARDIAC:  Rhythm was regular.  There is a short [id number] systolic murmur in  left sternal edge.  I could hear no diastolic murmur.  ABDOMEN:  Soft,  normal bowel sounds.  EXTREMITIES:  There was  no peripheral edema.  The pedal pulses were  equal.   An electrocardiogram showed pacing of both chambers.   IMPRESSION:  1. Recent productive cough and wheezing related to acute bronchitis.  2. Chronic asthma.  3. Hypertrophic cardiomyopathy.  4. Diastolic heart failure, now euvolemic.  5. Normal coronary angiography.  6. Status post dual-mode, dual-pacing, dual-sensing pacemaker for      complete atrioventricular block.  7. Hypertension.  8. Hyperlipidemia.  9. Continued cigarette use.   RECOMMENDATIONS:  [name] has acute episode of asthma and  bronchitis.  I will plan to get a chest x-ray.  I will give her a course  of azithromycin.  We will also get a CBC and BNP today.  I will plan to  see her back in 4 months or sooner if she does not respond to therapy.     [name] Alfonso Patten [name], MD, [facility]  Electronically Signed    BRB/MedQ  DD: 12/20/2008  DT: 12/21/2008  Job #: [id number]

## 2011-04-29 NOTE — Discharge Summary (Signed)
NAMEAYNARA, URUETA       ACCOUNT NO.:  [id number]   MEDICAL RECORD NO.:  [id number]          PATIENT TYPE:  INP   LOCATION:  [id number]                         FACILITY:  [place]   PHYSICIAN:  Domingo Cocking. Jimmye Norman, M.D.DATE OF BIRTH:  Feb 13, 1946   DATE OF ADMISSION:  05/11/2008  DATE OF DISCHARGE:  05/16/2008                               DISCHARGE SUMMARY   REASON FOR HOSPITALIZATION:  Dyspnea and cough.   DISCHARGE DIAGNOSES:  1. Acute decompensated heart failure with likely diagnosis of      hypertrophic cardiomyopathy and normal coronary angiography.  2. Heart failure, resolved.   ADDITIONAL DIAGNOSES:  1. Hypertension.  2. Hyperlipidemia.  3. Status post pacemaker placement for complete atrioventricular      block.  4. Tobacco abuse.  5. History of alcohol abuse.   DISCHARGE MEDICATIONS:  1. Aspirin 325 mg p.o. daily.  2. K-Dur 20 mEq p.o. once daily.  3. Crestor 10 mg p.o. once daily.  4. Albuterol 2 puffs as needed.  5. Lopressor 50 mg p.o. twice a day, this is a new medication.  6. Hydralazine 10 mg p.o. 4 times daily, this is a new medication.  7. Bupropion extended release 150 mg p.o. twice daily, this is a new      medication.   DISCHARGE INSTRUCTIONS:  1. The patient is to take medications as mentioned previously.  2. The patient is to stop smoking.  3. The patient is to follow up with Dr. Eugenie Norrie at [facility]      family practice on June 01, 2008 at 4 p.m.  4. The patient is to follow up with Dr. [name] with Cardiology.  They      will call her with an appointment.  At this time, transesophageal      echocardiogram will be pursued by Dr. [name].   SIGNIFICANT FINDINGS/ADMISSION WORKUP:  1. Chest x-ray on admission showed extensive alveolar edema or      bilateral pneumonia and stable cardiac cardiomegaly.  Followup      chest x-ray after admission showed persistent pulmonary edema.      Followup chest x-ray on May 12, 2008, showed improved edema,  no      evidence of pneumonia.  Last chest x-ray performed on May 14, 2008,      showed resolved edema with mild vascular congestion and minimal      bibasilar atelectasis with no evidence of pneumonia.  CBC on      admission showed a white blood cell count of 8.0, hemoglobin of      10.1, and platelet count of 287 with 49% neutrophils.  BNP on      admission was elevated to 206.  Complete metabolic panel on      admission showed a slightly low potassium of 3.3, BUN of 24, and a      slightly elevated creatinine to 1.5.  Liver function tests were      within normal limits except for mildly elevated AST of 56 and an      ALT of 38.  Cardiac enzymes on admission showed normal CK-MB at 2.0  and a troponin that was elevated to 0.21.  Followup cardiac enzymes      showed a troponin that had increased to 0.22 with the next set      showing troponin at 0.26.  D-dimer on admission was 0.97.  An ABG      on admission showed a pH of 7.47, pCO2 of 32.7, pO2 of 70.5, and a      bicarb of 23.6.  2. Further inpatient workup, BNP was performed on hospital day #2 and      was elevated to 892.  ESR was mildly elevated at 38.  Drug abuse      screen was negative.  Hemoglobin A1c was negative.  Cardiac enzymes      were drawn sequentially and troponins elevated to a maximum 0.35 by      May 12, 2008, at 7:30 p.m.  3. Cardiac catheterization report showed normal angiography with      severe left ventricular hypertrophy with cavity obliteration and an      ejection fraction of 80%, a normal pulmonary artery wedge pressure      of 6 mmHg.  These findings were consistent with hypertrophic      cardiomyopathy.  Recommendation is for transesophageal      echocardiogram as an outpatient.   BRIEF HOSPITAL COURSE:  The patient is a 65 year old female with history  of hyperlipidemia and hypertension, who presented with acute shortness  of breath.  On chest x-ray, findings were consistent with either  pulmonary  infiltrate or pulmonary edema.  Cardiology was consulted as  well as Critical Care Pulmonary Medicine.  Given that the patient had  obvious edema and possible pneumonia, she was started on broad-spectrum  antibiotics and given Lasix for diuresis.  With vigorous diuresis on IV  Lasix, the patient had resolution of pulmonary edema by hospital day #3,  and shortness of breath resolved completely.  Given the bump in the  patient's troponins, Cardiology was consulted as previously stated.  Cardiology, once the patient was stable, recommended cardiac  catheterization.  She was placed on nitroglycerin and heparin drip.  Cardiac catheterization was performed on May 15, 2008, and it showed  normal coronary angiography with evidence of left ventricular  hypertrophy concerning for hypertrophic cardiomyopathy.  Accordingly,  the patient was started on a beta-blocker and was continued on  hydralazine, which she received as an inpatient.  Preference would have  been to start an ACE inhibitor to decrease cardiac afterload; however,  the patient has an allergy to ACE inhibitors with angioedema and  hydralazine was given instead.  The patient was counseled to stop  smoking during this admission.  Throughout admission, vital signs  remained stable.  She remained afebrile.  She did not have a white count  and blood pressures were within normal limits.  She did have some mildly  low blood pressures with Lasix diuresis, but this resolved with decrease  in Lasix.   PROCEDURE:  Cardiac catheterization.   CONSULTATIONS:  1. Pulmonary Critical Care Medicine.  2. Cardiology.   DISPOSITION:  The patient was discharged to home.   DISCHARGE CONDITION:  Stable.   FOLLOWUP ISSUES:  The patient is to stop smoking.  She was started on  bupropion twice daily to help with this.  She should also be monitored  for adherence to antihypertensive regimen with Lopressor and  hydralazine.  Diltiazem and  hydrochlorothiazide, which were previously  on her medication list, were discontinued at this hospitalization.  Eugenie Norrie, MD  Electronically Signed      Domingo Cocking. Jimmye Norman, M.D.  Electronically Signed    TE/MEDQ  D:  05/16/2008  T:  05/17/2008  Job:  [id number]   cc:   Eugenie Norrie, MD  Vanna Scotland. [name], MD, [facility]

## 2011-04-29 NOTE — Assessment & Plan Note (Signed)
[facility]                            CARDIOLOGY OFFICE NOTE   [name], [name]                 MRN:          [id number]  DATE:10/13/2007                            DOB:          [birth year]    This is a patient of Dr. Eustace Quail and Dr. Marcello Moores Day at [facility].   [name] is a very pleasant, 65 year old, African-American female who  was seen as an add on today for recurrent palpitations. She has a  history of a DDD pacemaker and has had multiple mode switches in the  past. Dr. [name] had had her on Toprol at one time but this was stopped  because of complaints of some hair loss and we were not whether it was  related to the Toprol. The patient says she has always had palpitations  but yesterday they became worse. She says they just are very quick, skip  very quickly and do not last long. She denies any dizziness or  presyncope, shortness of breath or chest pain associated with this. She  says she is not sure that the hair loss was from the Toprol as she had a  permanent at the time and has let this grow out. She did have her pacer  checked today and she has had over 2000 mode switches but we cannot tell  whether these are atrial fibrillation or not.   CURRENT MEDICATIONS:  1. Klor-Con 20 mEq daily.  2. Hydrochlorothiazide 25 mg daily.  3. Enalapril 10 mg daily.  4. Norvasc 10 mg daily.  5. Aspirin 81 mg daily.  6. Crestor 10 mg daily.   PHYSICAL EXAMINATION:  GENERAL:  This is a pleasant, 65 year old,  African-American female in no acute distress.  VITAL SIGNS:  Blood pressure 110/78, pulse 60.  NECK:  Without JVD, HJR, bruit or thyroid enlargement.  LUNGS:  Clear anterior, posterior and lateral.  HEART:  Regular rate and rhythm at 60 beats per minute. Normal S1 and  S2. No murmur, rub, bruit, thrill or heave noted.  ABDOMEN:  Soft without organomegaly, masses, lesions or abnormal  tenderness.  EXTREMITIES:   Without cyanosis, clubbing or edema. She has good distal  pulses.   EKG:  AV paced.   IMPRESSION:  1. Palpitations worsening recently.  2. Status post dual mode, dual pacing, dual sensing pacemaker and plan      for complete AV block in 1986 with generator change in 2004 with a      St. Jude Verity dual mode, dual pacing, dual sensing pacemaker.  3. Hypertension.  4. Hyperlipidemia.  5. Mild left ventricular hypertrophy.   PLAN:  I have placed a Holter monitor on her to see if she is in fact  having atrial fibrillation as she is not on Coumadin. I have also given  her a prescription for Toprol and told her to watch for hair loss if  this does happen again and we will schedule her to see Dr. [name] back  within the next month.      Ermalinda Barrios, PA-C  Electronically Signed      Marijo Conception.  Verl Blalock, MD, [facility]  Electronically Signed   ML/MedQ  DD: 10/13/2007  DT: 10/14/2007  Job #: [id number]   cc:   Elliot Dally Day, MD

## 2011-04-29 NOTE — Assessment & Plan Note (Signed)
[facility]                          CHRONIC HEART FAILURE NOTE   [name], [name]                 MRN:          [id number]  DATE:05/22/2008                            DOB:          05-21-1946    PRIMARY CARE PHYSICIAN:  [facility].   PRIMARY CARDIOLOGIST:  Vanna Scotland. [name], MD, [facility]   HISTORY OF PRESENT ILLNESS:  [name] is new to the Heart  Failure Clinic.  She is referred here by Dr. [name], status post recent  hospitalization.  [name] is a 65 year old female, status post  recent hospitalization for acute decompensated heart failure secondary  to non-ischemic cardiomyopathy.  She is status post cardiac  catheterization during that hospitalization, which showed normal  angiography with severe left ventricular hypertrophy, EF of 80%, and  normal pulmonary artery wedge pressure.  Findings were consistent with  hypertrophic cardiomyopathy.  Dr. [name] recommended a transesophageal  echocardiogram to be arranged as an outpatient.  [name] lives  here in [place].  Her daughter has moved down from up [place] to stay  with her while she is recuperating.  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  She drives.  She is currently  separated from her husband.  She states she has been under increased  amount of stress secondary to her unemployment since being sick.  Her  daughter is also unemployed at this time.  [name] is on  disability and states she has been under heavy financial burden  secondary to credit card expenses.  She feels this contributes to the  increased burden on her heart.  Unfortunately, she continues to smoke.  She states she was down to 3 cigarettes yesterday and has had 1  cigarette today.  She was discharged home on a prescription for  Wellbutrin, but states she has filled the prescription, but has not  started this yet.  In reviewing her diet  and fluid intake with her, she  has poor insight to her diagnosis.  She is still using she states, a  pinch of salt when she cooks her food.  She went out to eat one night  last week and had fried fish at a local seafood restaurant.  She still  complains of dyspnea with minimal exertion.  Denies any orthopnea or  PND.  Denies any fever or chills.  Still has a productive cough, she  states, it is yellow sputum, ongoing fatigue, and generalized weakness.  She states, she is not resting well, most likely secondary to multiple  reasons.   PAST MEDICAL HISTORY:  1. Congestive heart failure, non-ischemic in nature with a normal EF,      most likely secondary to hypertrophic cardiomyopathy, pending      transesophageal echocardiogram per Dr. [name].  2. Hypertension.  3. Hyperlipidemia.  4. Ongoing tobacco use.  5. Status post pacemaker for heart block with subsequent upgrade to a      St. Jude VRT dual-mode dual-sensing pacemaker.  6. History of palpitations.  7. Remote history of EtOH abuse.  8. Asthma.  9. Status post cardiac catheterization during recent hospitalization.  The patient with normal coronary angiography, severe LVH with an EF      of 80%, normal pulmonary artery wedge 6 mmHg.  Findings were      consistent with hypertrophic cardiomyopathy.   FAMILY HISTORY:  Positive for vascular disease.  Mother died at age 44  of a stroke.  Father died at age 38 with a stroke and a heart attack.  Sister who has had a stroke.  [name] separated from her  husband.  She has 4 children, currently has 1 daughter living with her,  and continues to use tobacco products.   REVIEW OF SYSTEMS:  As stated above, otherwise negative.   CURRENT MEDICATIONS:  1. Aspirin 325 daily.  2. Klor-Con 20 mEq daily.  3. Metoprolol 50 mg b.i.d.  4. Furosemide 40 mg daily.  5. Hydralazine 10 mg q.i.d.  6. Wellbutrin 150 mg b.i.d.  7. Albuterol 2 puffs as needed.   CLINICAL DATA:  Most  recent blood work checked on May 16, 2008, H&H 11.7  and 34.9.  Sodium 138, potassium 3.9, BUN 33, and creatinine 1.37.  Chemistry repeated on May 19, 2008, showing potassium of 4.1, BUN 38,  and creatinine 1.3.  Available EKG dated May 11, 2008, shows AV pacing  at a rate of 69.   PHYSICAL EXAM:  Weight 164 pounds and blood pressure 105/71 with a heart  rate of 79.  [name] is in no acute distress.  She has no signs of jugular vein distention at 45-degree angle.  LUNGS:  She has scattered rhonchi that clears with cough, otherwise  clear to auscultation.  CARDIOVASCULAR:  S1 and S2.  Regular rate and rhythm.  ABDOMEN:  Soft and nontender.  Positive bowel sounds.  LOWER EXTREMITIES:  Without clubbing, cyanosis, or edema.  NEUROLOGICAL:  Alert and oriented x3 at this time.   IMPRESSION:  Congestive heart failure, most likely secondary to  hypertrophic cardiomyopathy.  The patient's discharge summary states she  needs an outpatient TEE for followup.  The patient did have a 2-D  echocardiogram during recent hospitalization.  I am going to leave this  up to Dr. [name] to arrange as the patient has a followup appointment  with him for further evaluation.  We  will continue current medications at current dose.  Blood pressure well  controlled at this time.  [name] shows no signs of volume  overload.      Rosanne Sack, ACNP  Electronically Signed      Shaune Pascal. Bensimhon, MD  Electronically Signed   MB/MedQ  DD: 05/23/2008  DT: 05/24/2008  Job #: [id number]   cc:   Eugenie Norrie, MD

## 2011-04-29 NOTE — Assessment & Plan Note (Signed)
[facility]                            CARDIOLOGY OFFICE NOTE   [name], [name]                 MRN:          [id number]  DATE:12/27/2007                            DOB:          02-12-1946    PRIMARY CARE PHYSICIAN:  Dr. Marcello Moores Day at [facility]   HISTORY:  [name] is 65 years old and has a DDD pacemaker  implanted for complete AV block.  She recently saw Dr. Verl Blalock because of  some palpitations, which were very quick and not lasting very long.  He  put her on Toprol but she developed hair loss from this and this had to  be stopped.  He got an event monitor which did not show any definite  arrhythmias, although there is a lot of artifact and it is difficult to  be sure.   She says that she still has the palpitations she describes as a sharp,  jumping feeling, but this does not interfere too much with her daily  routines.  She is under some stress and she separated from her husband  about six months ago, and she has some financial concerns.   PAST MEDICAL HISTORY:  Significant for hypertension, hyperlipidemia.   CURRENT MEDICATIONS:  1. Hydrochlorothiazide.  2. Potassium.  3. Enalapril.  4. Norvasc.  5. Aspirin.  6. Crestor   PHYSICAL EXAMINATION:  Blood pressure:  140/84.  Pulse:  85, regular.  No venous distension.  Carotid pulses were full without bruits.  CHEST:  Clear.  HEART:  Rhythm was regular.  No murmurs or gallops.  ABDOMEN:  Soft without organomegaly.  EXTREMITIES:  Peripheral pulses full.  No peripheral edema.   Electrocardiogram showed atrial pacing in both chambers.  We  interrogated her pacemaker.  She did have some mode switches, but the  duration was all less than 1 minute.   IMPRESSION:  1. Palpitations.  2. Status post DDD pacemaker for complete atrioventricular block.  3. Hypertension.  4. Hyperlipidemia.  5. Left ventricular hypertrophy.   RECOMMENDATIONS:  I am still not  certain if her very brief mode switches  are atrial fibrillation.  We could not really tell from her event  monitor.  I plan to switch her from Norvasc 10  to Cardizem 360 with the hope that this will improve her palpitations.  I plan to see her back in six months.     Bruce Alfonso Patten [name] Perches, MD, [facility]  Electronically Signed    BRB/MedQ  DD: 12/27/2007  DT: 12/27/2007  Job #: [id number]

## 2011-04-29 NOTE — Assessment & Plan Note (Signed)
[facility]                          CHRONIC HEART FAILURE NOTE   [name], [name]                 MRN:          [id number]  DATE:06/06/2008                            DOB:          07-06-1946    PRIMARY CARDIOLOGIST:  Vanna Scotland. [name], MD, [facility]   PRIMARY CARE:  [facility].   HISTORY OF PRESENT ILLNESS:  [name] returns today for  followup status post recent initial visit for further management of her  congestive heart failure, which is nonischemic in nature with a normal  EF, most likely be secondary to hypertrophic  cardiomyopathy/hypertension, also with a history of EtOH abuse.  When I  saw [name] as a new patient earlier this month, I found her  to be euvolemic.  Blood pressure well-controlled.  She returns today for  followup.  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  She  complains of some chest soreness with repetitive upper extremity  motions.  Also continues to try and decrease her tobacco use.  She is  down to 3 cigarettes a day, but states she has found herself eating more  in lieu  of smoking.  Also continues to drink a small amounts of EtOH,  occasionally has a sip of wine or a sip of a mixed drink.  She states  that she knows she should not be drinking or smoking, but sometimes she  just needs it to calm her nerves.  Otherwise she denies any problems.   PAST MEDICAL HISTORY:  1. Congestive heart failure nonischemic in nature with a normal EF in      the setting of hypertension and history EtOH abuse.  2. Hypertension.  3. Hyperlipidemia.  4. Status post permanent pacemaker for heart block with subsequent      upgrade to a Regan pacemaker.  5. History of palpitations.  6. History of tobacco and alcohol abuse.  7. Asthma.  8. Status post cardiac catheterization showing normal coronary      angiography.  Normal pulmonary artery wedge pressure.  Findings      consistent with hypertrophic cardiomyopathy.  9. Pending transesophageal echocardiogram per Dr. [name].   REVIEW OF SYSTEMS:  As stated above, otherwise negative.   CURRENT MEDICATIONS:  1. Klor-Con 20 mEq 1 tablet daily.  2. Crestor 10 mg daily.  3. Aspirin 325 mg daily.  4. Metoprolol 50 mg b.i.d.  5. Furosemide 40 mg daily.  6. Hydralazine 10 mg q.i.d.  7. Bupropion 150 mg b.i.d.  The patient has not started this      medication yet.   PHYSICAL EXAMINATION:  VITAL SIGNS:  Weight 170 pounds, weight is up 6  pounds; blood pressure 131/91 with a heart rate of 77.  GENERAL:  [name] is in no acute distress.  NECK:  No signs of jugular vein distention at 45 degree angle.  LUNGS:  Clear to auscultation bilaterally.  CARDIOVASCULAR:  Reveals S1 and S2.  Regular rate and rhythm.  ABDOMEN:  Soft and  nontender. Positive bowel sounds.  LOWER EXTREMITIES:  Without clubbing, cyanosis, or edema.  NEUROLOGIC:  Alert and oriented x3.   IMPRESSION:  Congestive heart failure secondary to hypertrophic  cardiomyopathy.  The patient continues to remain mildly hypertensive.  She is currently on hydralazine 10 mg q.i.d., but has a hard time  remembering to take it 4 times a day.  I am going to have her change it  to 20 mg t.i.d.  We will continue her metoprolol at current dose.  Once  again, I have cautioned her on the use of tobacco and EtOH with her  diagnosis.  She is also pending a transesophageal echocardiography per  Dr. Nichola Sizer note.  We will have the patient follow up with Dr. [name]  for further evaluation of this.      Rosanne Sack, ACNP  Electronically Signed      Shaune Pascal. Bensimhon, MD  Electronically Signed   MB/MedQ  DD: 06/06/2008  DT: 06/07/2008  Job #: [id number]   cc:   [facility]

## 2011-04-29 NOTE — Assessment & Plan Note (Signed)
[facility]                            CARDIOLOGY OFFICE NOTE   [name], [name]                 MRN:          [id number]  DATE:06/14/2008                            DOB:          1946/01/24    CLINICAL HISTORY:  [name] returned for followup management of  her congestive heart failure and pacemaker after her recent  hospitalization.  [name] has a St. Jude DDD pacemaker which  was implanted for complete heart block.  She she was recently  hospitalized with respiratory failure, which was thought to be a  combination of congestive heart failure and pneumonia, which required  intubation.  She was treated by antibiotics by critical care medicine  and she was also seen by [place] and diuresed.  She underwent catheterization  and at the time at catheterization her wedge was normal.  Her coronaries  were also normal and her left ventricle was severely hypertrophied with  near cavity obliteration.  Based on this, I thought she probably had a  hypertrophic cardiomyopathy responsible for heart failure.   She has done well since her discharge from the hospital and has had no  swelling or fluid accumulation that she can tell.  She has gotten back  into her normal activities and she has had no chest pain.   PAST MEDICAL HISTORY:  Significant for hypertension, hyperlipidemia,  continued tobacco use, and asthma.   CURRENT MEDICATIONS:  1. Crestor.  2. Potassium 20 mEq daily.  3. Metoprolol 50 mg b.i.d.  4. Furosemide 40 mg daily.  5. Hydralazine 10 mg 2 tablets b.i.d.   PHYSICAL EXAMINATION:  VITAL SIGNS:  Today, the blood pressure is  102/80.  NECK:  There was no venous distension.  The carotid pulses were full  without bruits.  CHEST:  Clear.  CARDIAC:  Rhythm was regular.  I could hear no murmurs or gallops.  ABDOMEN:  Soft with normal bowel sounds.  EXTREMITIES:  There was no peripheral edema.  Pedal pulses were equal.   We  interrogated her pacemaker and she had no atrial or ventricular  activity.  Marshell Levan did the interrogation, it was difficult to tell about  an atrial repolarization what we thought that she was capturing in the  atrium and pressure was about 1.5 mV.   IMPRESSION:  1. Recent hospitalization for respiratory failure secondary to      congestive heart failure and pneumonia, now improved.  2. Probable hypertrophic cardiomyopathy.  3. Normal coronary angiography.  4. Status post DDD pacemaker for high-degree arteriovenous block -      pacer dependent.  5. Hypertension.  6. Hyperlipidemia.  7. Cigarette use.  8. Asthma.   RECOMMENDATIONS:  I think [name] is doing much better.  We  will plan to get a transthoracic echo here to see if we could see her  septum and posterior wall well enough to make a diagnosis of  hypertrophic cardiomyopathy.  If this study is not adequate, then we may  consider a TEE.  I would like to get a chest x-ray both to follow up on  congestive heart failure but also to look  at her lead position since her  leads were somewhat taut at the time of her catheterization.  We will  get a BMP and CBC on that day and I will see her back in 2 months.     Bruce Alfonso Patten [name], MD, [facility]  Electronically Signed    BRB/MedQ  DD: 06/14/2008  DT: 06/15/2008  Job #: [id number]

## 2011-04-29 NOTE — Discharge Summary (Signed)
[name], [name]       ACCOUNT NO.:  [id number]   MEDICAL RECORD NO.:  [id number]          PATIENT TYPE:  INP   LOCATION:  5123                         FACILITY:  [facility]   PHYSICIAN:  [name], M.D.DATE OF BIRTH:  1946-09-20   DATE OF ADMISSION:  03/15/2008  DATE OF DISCHARGE:  03/15/2008                               DISCHARGE SUMMARY   PRIMARY CARE [name]:  Dr. Eugenie [name] at [facility].   REASON FOR ADMISSION:  Tongue swelling.   DISCHARGE DIAGNOSES:  1. Macroglossia secondary to angioedema.  2. Allergy to ACE inhibitors.  3. Hypertension.  4. Urinary tract infection.  5. History of panic attacks.  6. Hyperlipidemia.  7. Sleep apnea.   DISCHARGE MEDICATIONS:  1. Aspirin 325 mg p.o. daily.  2. Vicodin as previously prescribed.  3. Potassium chloride 20 mEq p.o. daily.  4. Crestor 10 mg p.o. daily.  5. Hydrochlorothiazide 25 mg p.o. daily.  6. Diltiazem 360 mg p.o. daily.  7. Albuterol inhaler 2 puffs q.4-6 h. p.r.n.  8. Bactrim double strength 1 tablet p.o. b.i.d. x3 days.   MEDIATIONS THAT WERE STOPPED:  Enalapril secondary to angioedema.   There was initially some confusion regarding why the patient was on  Norvasc or diltiazem.  According to notes from Cardiology, the patient's  Norvasc was stopped in January and she was started on Cardizem 360 to  improve her palpitations.   HOSPITAL COURSE:  1. Macroglossia:  The patient with a similar episode in February 2009.      She woke up at 1 a.m. with symptom of tongue swelling and presented      to the Emergency Department.  Denied any lip or throat swelling as      well as any difficulty breathing.  She was stable throughout her      hospitalization.  Her macroglossia improved upon presentation in      the Emergency Department throughout the morning.  She did received      50 mg of Benadryl and Decadron in the Emergency Department.  The      patient's macroglossia was likely  secondary to angioedema due to      her ACE inhibitor.  Therefore, this was held and the patient should      not be placed on ACE inhibitor again.  2. Hypertension:  Blood pressure was stable.  She was not placed on      any medications while she was in the hospital for her blood      pressure.  This should be monitored and adjusted as an outpatient      by both her primary cardiologist and her primary care [name].  3. Urinary tract infection:  The patient did not report any dysuria or      polyuria.  Her urinalysis was significant for large leukocytes and      trace blood, and there were bacteria on microscopy.  She was      discharged on Bactrim 1 double-strength tablet p.o. b.i.d. for 3      days.   CONDITION AT THE TIME OF DISCHARGE:  Improved.   DISCHARGE  LABS:  None.   DISPOSITION:  The patient is discharged to home.   DISCHARGE FOLLOWUP:  The patient is to follow up with Dr. Eugenie [name]  at [facility] on April 06, 2008, at 2 p.m.   FOLLOWUP ISSUES:  Blood pressure control with the patient off enalapril.      [name] Puller, MD  Electronically Signed      [name], M.D.  Electronically Signed    TCB/MEDQ  D:  03/15/2008  T:  03/16/2008  Job:  [id number]

## 2011-04-29 NOTE — Letter (Signed)
May 22, 2008     RE:  BETSIE, HEIMBERGER  MRN:  [id number]  /  DOB:  1945/12/31   To Whom It May Concern:   Ms. Esfahani is a patient here in the [facility] with  [facility].  She has significant health problems including  congestive heart failure requiring a recent hospitalization.  She has  ongoing symptoms related to this diagnosis and is rather limited in her  physical abilities at this time.  Ms. Bollenbacher recuperation  period is unknown at this time because of her ongoing health problems,  [name] is limited in her physical exertion and needs to avoid  climbing steps or physical activities that involve walking up an incline  or hills.  With this in mind, please consider assisting Ms.  Alexanderpolk in new living arrangements that are more suitable to her  diagnosis.  If I can be of any further assistance, please do not  hesitate to call me, Rosanne Sack, [facility], [phone].    Sincerely,      Rosanne Sack, ACNP  Electronically Signed    MB/MedQ  DD: 05/23/2008  DT: 05/23/2008  Job #: [id number]

## 2011-04-29 NOTE — Discharge Summary (Signed)
NAME:  SOUMYA, [name] NO.:  [id number]   MEDICAL RECORD NO.:  [id number]            PATIENT TYPE:   LOCATION:                                 FACILITY:   PHYSICIAN:  Blane Ohara McDiarmid, M.D.     DATE OF BIRTH:   DATE OF ADMISSION:  DATE OF DISCHARGE:                               DISCHARGE SUMMARY   ADDENDUM   Please add to discharge medication list Lasix 40 mg p.o. daily as the  number 8th medication.  This has been amended by hand in the patient's  paper chart.  This electronic dictation is performed to complete  documentation for this hospital stay.  Please also note that the Lasix  40 mg was not added to the patient's discharge instructions by me, but  presumably by the patient's cardiologist or one of her other medical  providers during the hospital stay as it is not my handwriting.  However, I have seen the patient in the office setting and she continues  on this medication currently.      Eugenie Norrie, MD  Electronically Signed      Blane Ohara McDiarmid, M.D.  Electronically Signed    TE/MEDQ  D:  01/31/2009  T:  02/01/2009  Job:  [id number]

## 2011-04-29 NOTE — Discharge Summary (Signed)
NAMECHABELY, SENSABAUGH       ACCOUNT NO.:  [id number]   MEDICAL RECORD NO.:  [id number]          PATIENT TYPE:  INP   LOCATION:  5123                         FACILITY:  [facility]   PHYSICIAN:  Jamal Collin. Hensel, M.D.DATE OF BIRTH:  1946-06-14   DATE OF ADMISSION:  03/15/2008  DATE OF DISCHARGE:  03/15/2008                               DISCHARGE SUMMARY   PRIMARY CARE Aspasia Rude:  Dr. Eugenie Norrie of [facility].   REASON FOR ADMISSION:  Tongue swelling.   DISCHARGE DIAGNOSES:  1. Macroglossia secondary to angioedema.  2. Allergy to ACE inhibitors.  3. Hypertension.    Dictation ended at this point.      Sherrell Puller, MD       Jamal Collin Andria Frames, M.D.     TCB/MEDQ  D:  03/15/2008  T:  03/16/2008  Job:  [id number]

## 2011-04-29 NOTE — Assessment & Plan Note (Signed)
[facility]                            CARDIOLOGY OFFICE NOTE   [name], [name]                 MRN:          [id number]  DATE:08/14/2008                            DOB:          [birth year]    CLINICAL HISTORY:  [name] returned for followup management of  her diastolic heart failure.  She has a St. Jude DDD pacemaker that has  been implanted for complete AV block.  She was recently hospitalized  with congestive heart failure and respiratory heart failure thought to  be related to both congestive heart failure and possible pneumonia.  She  had catheterization in the hospital, which showed normal coronary  arteries which showed severe LVH with cavity obliteration.  She  subsequently had an echocardiogram, which confirmed severe left  ventricular hypertrophy, and she has a diagnosis of hypertrophic  cardiomyopathy.   She states she has been doing fairly well recently.  She has not had  much in the way of fluid.  She has mild shortness of breath and no chest  pain.   PAST MEDICAL HISTORY:  Significant for hypertension, hyperlipidemia,  continued tobacco use, and asthma.   CURRENT MEDICATIONS:  1. Hydralazine 10 mg 2 tablets q.i.d.  2. Crestor 10 mg daily.  3. K-Lor.  4. Furosemide 40 mg daily.  5. Metoprolol 50 mg b.i.d.   PHYSICAL EXAMINATION:  The blood pressure was 159/100 and pulse 82 and  regular.  There was venous pulsation visible just at the clavicle.  The  chest was clear without rales or rhonchi.  Cardiac rhythm was regular.  I hear no murmurs or gallops.  Abdomen was soft with normal bowel  sounds.  There is no hepatosplenomegaly.  There is trace peripheral  edema.  Pedal pulses are equal.   IMPRESSION:  1. Diastolic heart failure, now close to euvolemic.  2. Hypertrophic cardiomyopathy.  3. Normal coronary angiography.  4. Status post dual-mode, dual-pacing, dual-sensing pacemaker for      complete  atrioventricular block.  5. Hypertension.  6. Hyperlipidemia.  7. Cigarette use.  8. Asthma.   RECOMMENDATIONS:  [name] appears to be doing fairly well.  I  do not think hydralazine is the best drug for her especially since her  blood pressure is not well controlled and since she has a hypertrophic  cardiomyopathy.  We will plan to switch her to Cardizem CD 180  mg daily in place of the hydralazine.  We will get a BMP and CBC today.  We will plan to get a blood pressure check in about a week and I will  see her back in 4 months.     Bruce Alfonso Patten [name], MD, [facility]  Electronically Signed    BRB/MedQ  DD: 08/14/2008  DT: 08/15/2008  Job #: [id number]

## 2011-05-02 NOTE — Discharge Summary (Signed)
[name], [name]                 ACCOUNT NO.:  [id number]   MEDICAL RECORD NO.:  [id number]                   PATIENT TYPE:  OIB   LOCATION:  [id number]                                 FACILITY:  [facility]   PHYSICIAN:  Eustace Quail, M.D.                  DATE OF BIRTH:  [birth year]   DATE OF ADMISSION:  11/17/2003  DATE OF DISCHARGE:  11/18/2003                           DISCHARGE SUMMARY - REFERRING   PROCEDURE:  Pacemaker generator pacemaker, December 3rd.   REASON FOR ADMISSION:  Please refer to dictated admission note.   LABORATORY DATA:  Sodium 137, potassium 3.9, glucose 117, BUN 25, creatinine  1.0.   ADMISSION CHEST X-RAY:  No active disease.   HOSPITAL COURSE:  Patient presented for elective replacement of her  pacemaker generator secondary to end of life.  Dr. [name] successfully  removed the old Synchrony pacemaker and put in a new Verity generator set in  DDD mode.  Dr. [name] noted temporary nonpacing after implantation secondary  to poor grounding in the pocket.  This was successfully repositioned.   Patient was kept for overnight observation.  Reported no complaints the  following morning.  Pacemaker  follow revealed normal functioning parameters  with no P or R waves greater than 40 BPM and no sensing issues.   The wound site was stable with no evidence of hematoma or oozing.   DISCHARGE MEDICATIONS:  1. Norvasc 10 mg q.d.  2. Hydrochlorothiazide 25 mg q.d.  3. Naproxen 500 mg as previously directed (resume in one week).  4. Cod liver oil (resume in one week).  5. Aspirin 1 tablet daily.   INSTRUCTIONS:  1. As outlined in post-pacemaker instructions sheet.  2. Patient is to call the office for any concerns regarding bleeding,     swelling, or pain at the wound site.  3. Patient is scheduled to follow up with [facility] on     December 22nd at 9:30 a.m.   DISCHARGE DIAGNOSES:  Pacemaker generator end of life.  Status post  generator  replacement on November 17, 2003.      Gene Serpe, P.A. [facility]                      Eustace Quail, M.D.    GS/MEDQ  D:  11/18/2003  T:  11/19/2003  Job:  [id number]

## 2011-05-02 NOTE — Discharge Summary (Signed)
NAMEKARLEE, SIECZKOWSKI                 ACCOUNT NO.:  [id number]   MEDICAL RECORD NO.:  [id number]                   PATIENT TYPE:  OIB   LOCATION:  [id number]                                 FACILITY:  [facility]   PHYSICIAN:  Eustace Quail, M.D.                  DATE OF BIRTH:  12/06/1946   DATE OF ADMISSION:  11/17/2003  DATE OF DISCHARGE:  11/18/2003                           DISCHARGE SUMMARY - REFERRING   ADDENDUM:  Both pre- and post-pacemaker chest x-ray showed no active  disease.      Gene Serpe, P.A. [facility]                      Eustace Quail, M.D.    GS/MEDQ  D:  11/18/2003  T:  11/19/2003  Job:  [id number]

## 2011-05-02 NOTE — Assessment & Plan Note (Signed)
[facility]                            CARDIOLOGY OFFICE NOTE   DANYELL, KROLAK                MRN:          [id number]  DATE:11/30/2006                            DOB:          1946/06/07    PRIMARY CARE PHYSICIAN:  Dr. Marcello Moores Day at [facility].   CLINICAL HISTORY:  Ms. Mcilroy is 65 years old and has had a DDD  pacemaker implanted in 1986 for a complete AV block and a generator  change in 2004 with a [place] DDD pacemaker.  She also has had  hypertension and has mild LVH by echocardiography.   She says she has been doing fairly well, but she does have symptoms of  palpitations daily, although these are fairly short-lived.   PAST MEDICAL HISTORY:  Significant for hypertension, hyperlipidemia.   CURRENT MEDICATIONS:  Include Norvasc, hydrochlorothiazide, aspirin, K-  Dur, enalapril, alprazolam, simvastatin, and Toprol.  We called the  pharmacy and she has not had the Toprol filled since October, so we do  not think she has been taking it every day.   EXAMINATION:  Blood pressure is 140/94, and the pulse is 69 and regular.  There was no venous distension.  Carotid pulses were full and there were  no bruits.  CHEST:  Clear without rales or rhonchi.  CARDIAC:  Rhythm was regular.  I could hear no murmurs or gallops.  ABDOMEN:  Soft without organomegaly.  Peripheral pulses were full and there is no peripheral edema.   We interrogated her pacemaker and she was pacing both chambers all the  time.  She had good thresholds in both leads.  She did have some mode  switches that were also very, very short-lived.  This suggested APCs  rather than atrial fibrillation.   IMPRESSION:  1. Status post dual-mode, dual-pacing, dual-sensing pacemaker      implantation for a complete atrioventricular block in 1986 with      generator change in 2004, with a St. Jude Verity dual-mode, dual-      pacing, dual-sensing  pacemaker.  2. Hypertension, under optimal control and possibly related to some      issues with noncompliance.  3. Hyperlipidemia.  4. Mild left ventricular hypertrophy by echocardiography.  5. Palpitations.   RECOMMENDATIONS:  Mrs. Raquel Sarna is doing well from a standpoint of her  pacemaker.  Her blood pressure is not optimally controlled, and it  sounds like she is having some difficult complying with her medicines.  She complained of some hair loss, and this could be related to Toprol.  She has not been taking this regularly, so we will stop the Toprol and  will increase her enalapril from 5 to 10 mg a day.  Will continue the  Norvasc 10 and hydrochlorothiazide 25 a day.  We will have her come back  for a blood pressure check in 2 weeks and ask to see Dr. Elana Alm at the  [facility] in January, and I will see her back  for a pacer check in a year.     Bruce Alfonso Patten [name], MD, [facility]  Electronically Signed  BRB/MedQ  DD: 11/30/2006  DT: 11/30/2006  Job #: [id number]

## 2011-05-02 NOTE — Cardiovascular Report (Signed)
NAMEROZALIE, COMOLLI                 ACCOUNT NO.:  [id number]   MEDICAL RECORD NO.:  [id number]                   PATIENT TYPE:  OIB   LOCATION:  2866                                 FACILITY:  [facility]   PHYSICIAN:  Eustace Quail, M.D.                  DATE OF BIRTH:  1946/11/27   DATE OF PROCEDURE:  11/17/2003  DATE OF DISCHARGE:                              CARDIAC CATHETERIZATION   CLINICAL HISTORY:  [name] is 65 years old and had a Synchrony 3  dual chamber pacemaker implanted in 1986 with a generator change in 1998.  She recently reached end-of-life and was brought in for elective  replacement.  On interrogation of her device in the outpatient area she  developed asystole and required emergency reprogramming in the VVI mode.  Her battery had gotten so low that the energy change from the interrogation  had caused her to lose voltage on her ventricular lead and result in loss of  capture.  Reprogramming in the VVI mode temporarily stabilized the  situation.   PROCEDURE:  Explantation of the old Synchrony 3 St. Jude pulse generator  (model 2029MS serial [id number], Synchrony 3).  Inspection of the old atrial  lead ([place] [id number] serial [id number] implanted July 1986) and  inspection of the old ventricular lead ([place] [id number] serial  [id number] implanted July 08, 1985) and implantation of a new [place]  DDD pacemaker (model #[id number]/S serial [phone]).   INDICATIONS:  End-of-life of old generator.   ANESTHESIA:  1% local Xylocaine.   ESTIMATED BLOOD LOSS:  Less than 20 mL.   DESCRIPTION OF PROCEDURE:  The procedure was performed in laboratory room  #6.  We first placed a temporary pacemaker via the right femoral vein.  We  then redraped and approached the old generator pocket.  The right anterior  chest had been prepped and draped in the usual fashion.  The skin and  subcutaneous tissue were anesthetized with 1% local Xylocaine.  An  incision  was made over the old pacemaker pocket and extended down to the pocket.  The  pocket was opened and the generator was removed.  Both leads were inspected  and parameters were checked.  The pocket was irrigated with sterile  kanamycin solution.  We then attached the old leads to the new Verity  generator and implanted the new generator into the pocket.  We were unable  to capture on the ventricular channel.  We removed the pacemaker and  verified that the lead thresholds were still good and we grounded the system  outside the patient and obtained good capture.  Based on this we thought we  were not obtaining a good grounding within the pocket.  We reimplanted the  pacemaker into the pocket with pressure around the generator and were able  to obtain good capture.  We did not put a suture to secure the pacemaker  generator since this was an  old pocket.  The subcutaneous tissue was then  closed with a running 2-0 Dexon.  Skin was closed with running 6-0 Dexon.  The patient tolerated procedure well and left the laboratory in satisfactory  condition.   PACING PARAMETERS:  The minimum threshold per capture on the right atrium  was 0.9 volts.  Resistance was 450 ohms and the P-wave was 1.8 millivolts.  The minimum threshold capture on the ventricular lead was 0.5 volts.  The  resistance was 644 ohms.  We did not check an R-wave since the patient was  pacer dependent.   The patient tolerated procedure well and left the laboratory in satisfactory  condition.  Because of the loss of capture during the procedure due to  inadequate grounding within the pocket, will plan to watch the patient  tonight.  We applied a pressure dressing to the site.                                               Eustace Quail, M.D.    BB/MEDQ  D:  11/17/2003  T:  11/17/2003  Job:  [id number]   cc:   [place]

## 2011-05-28 ENCOUNTER — Encounter: Payer: Self-pay | Admitting: Internal Medicine

## 2011-06-21 ENCOUNTER — Other Ambulatory Visit: Payer: Self-pay | Admitting: Family Medicine

## 2011-06-21 DIAGNOSIS — I5032 Chronic diastolic (congestive) heart failure: Secondary | ICD-10-CM

## 2011-06-21 DIAGNOSIS — I10 Essential (primary) hypertension: Secondary | ICD-10-CM

## 2011-06-21 DIAGNOSIS — I4891 Unspecified atrial fibrillation: Secondary | ICD-10-CM

## 2011-06-22 NOTE — Telephone Encounter (Signed)
Refill request

## 2011-06-23 NOTE — Telephone Encounter (Signed)
Refills sent electronically

## 2011-07-11 ENCOUNTER — Other Ambulatory Visit: Payer: Self-pay | Admitting: Family Medicine

## 2011-07-11 ENCOUNTER — Encounter: Payer: Self-pay | Admitting: Internal Medicine

## 2011-07-11 NOTE — Telephone Encounter (Signed)
Refill request

## 2011-07-17 ENCOUNTER — Telehealth: Payer: Self-pay | Admitting: Internal Medicine

## 2011-07-17 ENCOUNTER — Encounter: Payer: Self-pay | Admitting: Internal Medicine

## 2011-07-17 ENCOUNTER — Ambulatory Visit (INDEPENDENT_AMBULATORY_CARE_PROVIDER_SITE_OTHER): Payer: PRIVATE HEALTH INSURANCE | Admitting: Internal Medicine

## 2011-07-17 DIAGNOSIS — I442 Atrioventricular block, complete: Secondary | ICD-10-CM

## 2011-07-17 DIAGNOSIS — D500 Iron deficiency anemia secondary to blood loss (chronic): Secondary | ICD-10-CM

## 2011-07-17 DIAGNOSIS — I509 Heart failure, unspecified: Secondary | ICD-10-CM

## 2011-07-17 DIAGNOSIS — K922 Gastrointestinal hemorrhage, unspecified: Secondary | ICD-10-CM

## 2011-07-17 DIAGNOSIS — R195 Other fecal abnormalities: Secondary | ICD-10-CM

## 2011-07-17 DIAGNOSIS — I4891 Unspecified atrial fibrillation: Secondary | ICD-10-CM

## 2011-07-17 DIAGNOSIS — I5032 Chronic diastolic (congestive) heart failure: Secondary | ICD-10-CM

## 2011-07-17 DIAGNOSIS — Z950 Presence of cardiac pacemaker: Secondary | ICD-10-CM

## 2011-07-17 DIAGNOSIS — Z79899 Other long term (current) drug therapy: Secondary | ICD-10-CM

## 2011-07-17 LAB — PACEMAKER DEVICE OBSERVATION
BMOD-0002RV: 10
BRDY-0004RV: 120 {beats}/min
RV LEAD THRESHOLD: 0.5 V

## 2011-07-17 NOTE — Telephone Encounter (Signed)
Chart reviewed, I sent a message to Dr Rayann Heman.Pt needs an OV and a SBCapsule endoscopy prior to being started on anticoagulant.

## 2011-07-17 NOTE — Assessment & Plan Note (Signed)
Appears stable Will ask Dr [name] to help [place] decide if she can retry coumadin or Xarelto as above.

## 2011-07-17 NOTE — Assessment & Plan Note (Signed)
Permanent atrial fibrillation She is well rate controlled She has increased stroke risks and therefore should be chronically anticoagulated.  Unfortuantely, she previously had GI bleeding with coumadin. I would like to consider restarting coumadin vs low dose (15mg  Xarelto).  I will refer her back to Dr Delfin Edis for her input. If the patient is felt to be a candidate to retry anticoagulation, we would then offer coumadin vs Xarelto 15mg  daily.  Continue ASA for now.

## 2011-07-17 NOTE — Progress Notes (Signed)
The patient presents today for routine electrophysiology followup.  Since last being seen in our clinic, the patient reports doing very well.  Her SOB has significantly improved and her edema has resolved.  She denies further GI bleeding but has dark stools with iron supplementation.  Today, she denies symptoms of palpitations, chest pain, shortness of breath, orthopnea, PND, lower extremity edema, dizziness, presyncope, syncope, or neurologic sequela.  The patient feels that she is tolerating medications without difficulties and is otherwise without complaint today.   Past Medical History  Diagnosis Date  . Angioedema     2/2 ACE  . Tobacco dependence     Quit January 2012; tried 1 cigarette March 2012, did not enjoy,  resolved to stay quit.   [name] RLS (restless legs syndrome)     Dx 06/2007  . Anemia   . Arteriovenous malformation of stomach   . Permanent atrial fibrillation   . Renal failure     baseline creatinine 1.6  . Hyperlipidemia   . Depression   . Diastolic heart failure   . Arthritis   . Sleep apnea   . Asthma   . Hx of colonic polyp 08/13/10    adenomatous  . Fatty liver 07/26/10  . Panic attacks   . Hypertension   . Complete heart block     s/p PPM 1998  . Hypertrophic cardiomyopathy     dx by Dr [name] 2009  . Hx of colonoscopy    Past Surgical History  Procedure Date  . Tubal ligation 04/01/2000  . Pacemaker insertion [place], most recent gen change by [name] 4/12  . Cardiac catheterization     Current Outpatient Prescriptions  Medication Sig Dispense Refill  . acetaminophen (TYLENOL) 325 MG tablet Take 650 mg by mouth every 6 (six) hours as needed.        [name] albuterol (PROAIR HFA) 108 (90 BASE) MCG/ACT inhaler two puffs every 4-6 hours as needed       . ALPRAZolam (XANAX) 0.5 MG tablet Take 0.5 mg by mouth at bedtime as needed. For anxiety       . aspirin 81 MG EC tablet Take 81 mg by mouth daily.        . carvedilol (COREG) 6.25 MG tablet TAKE 1 TABLET BY  MOUTH TWICE DAILY WITH MEALS  60 tablet  2  . diltiazem (CARDIZEM) 90 MG tablet TAKE 1 TABLET BY MOUTH DAILY  30 tablet  2  . ferrous sulfate 325 (65 FE) MG tablet TAKE 1 TABLET BY MOUTH TWICE DAILY  60 tablet  0  . furosemide (LASIX) 40 MG tablet Take 40 mg by mouth daily.        . Multiple Vitamins-Minerals (CENTRUM) tablet Take 1 tablet by mouth daily.        . Olopatadine HCl (PATADAY) 0.2 % SOLN Apply 1 drop to eye daily.  1 Bottle  5  . Omega-3 Fatty Acids (FISH OIL PO) Take by mouth. Take 2 capsules by mouth daily       . omeprazole (PRILOSEC) 20 MG capsule Take 20 mg by mouth 2 (two) times daily.       . Probiotic Product (SUPER PROBIOTIC PO) Take 1 capsule by mouth daily.        . rosuvastatin (CRESTOR) 10 MG tablet Take 10 mg by mouth daily.        [name] senna (SENOKOT) 8.6 MG tablet Take 1 tablet by mouth daily.        [name]  vitamin C (ASCORBIC ACID) 500 MG tablet Take 500 mg by mouth daily.          Allergies  Allergen Reactions  . Ace Inhibitors     REACTION: angioedema    History   Social History  . Marital Status: Divorced    Spouse Name: N/A    Number of Children: 67  . Years of Education: N/A   Occupational History  . disabled    Social History Main Topics  . Smoking status: Current Everyday Smoker -- 0.1 packs/day    Types: Cigarettes    Last Attempt to Quit: 02/23/2011  . Smokeless tobacco: Never Used   Comment: Made decision to quit after hospitalization. Relapsed a few days ago and had one "drag" but found it unpleasant. Has not smoked since then.   . Alcohol Use: 1.5 oz/week    3 drink(s) per week  . Drug Use: No  . Sexually Active: Not on file   Other Topics Concern  . Not on file   Social History Narrative   On disability for heart failure/pacemaker.  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 4 grown children,  8 grandchildren.  Drinks alcohol a few times a week socially. At one time, the most she reports drinking is about 3 mixed drinks.Lives  with daughter [name] and son. Will be moving 04/2011 to different house because concerned children are taking advantage of her financial situation. No illicit drugs.Smokes few cigarettes daily, especially when stressed. Started smoking @ age 54. Quit January 2012 2/2 health but started smoking again 02/2011.     Family History  Problem Relation Age of Onset  . Hypertension Mother   . Stroke Mother   . Hypertension Father   . Stroke Father   . Hypertension Sister   . Hypertension Brother   . Colon cancer Brother   . Diabetes Sister   . Diabetes Brother   . Cancer Brother   . Heart attack Brother   . Heart attack Father   . Heart disease Mother   . Heart disease Sister     ROS-  All systems are reviewed and are negative except as outlined in the HPI above    Physical Exam: Filed Vitals:   07/17/11 1402  BP: 122/87  Pulse: 69  Height: 5\' 5"  (1.651 m)  Weight: 181 lb (82.101 kg)    GEN- The patient is well appearing, alert and oriented x 3 today.   Head- normocephalic, atraumatic Eyes-  Sclera clear, conjunctiva pink Ears- hearing intact Oropharynx- clear Neck- supple, no JVP Lymph- no cervical lymphadenopathy Lungs- Clear to ausculation bilaterally, normal work of breathing Chest- pacemaker pocket is well healed Heart- Regular rate and rhythm (paced) GI- soft, NT, ND, + BS Extremities- no clubbing, cyanosis, or edema MS- no significant deformity or atrophy Skin- no rash or lesion Psych- euthymic mood, full affect Neuro- strength and sensation are intact  Pacemaker interrogation- reviewed in detail today,  See PACEART report  Assessment and Plan:

## 2011-07-17 NOTE — Assessment & Plan Note (Signed)
Much improved No changes today

## 2011-07-17 NOTE — Telephone Encounter (Signed)
Received a call from [place] with Dr. Rayann Heman. Dr. Rayann Heman wants to start patient back on Coumadin or low dose Xarelto because patient is at risk for stroke due to atrial fib. Patient has a hx of GI bleed with Coumadin. Scheduled patient to see Dr. [name] on 07/30/11 at 9:00 AM the next available appointment at this time. Would like to know if Dr. [name] could review chart and advise on this prior to the appointment. Please, advise.

## 2011-07-17 NOTE — Patient Instructions (Signed)
Please follow up in April with Dr Rayann Heman.  You will be sent a letter aprroximately 2 months before to call to schedule.  The current medical regimen is effective;  continue present plan and medications.  You are being referred to Dr Delfin Edis to see if you can restart Coumadin.

## 2011-07-17 NOTE — Assessment & Plan Note (Signed)
Normal pacemaker function See Pace Art report No changes today  

## 2011-07-21 ENCOUNTER — Telehealth: Payer: Self-pay | Admitting: *Deleted

## 2011-07-21 NOTE — Telephone Encounter (Signed)
Message copied by Hulan Saas on Mon Jul 21, 2011 10:14 AM ------      Message from: [name]      Created: Fri Jul 18, 2011  5:32 PM      Regarding: small bowl capsule       I would prefer to have it done before she sees me, but don't cancel her appointment if it is not possible.      ----- Message -----         From: Hulan Saas, RN         Sent: 07/18/2011   8:47 AM           To: [name], MD            Dr. [name],      Patient is seeing you on 07/30/11 for evaluation prior to going on Coumadin. You said she will need a capsule endo also. Do you want me to schedule this or wait until after OV/      [facility]

## 2011-07-22 NOTE — Telephone Encounter (Signed)
Addended by: Hulan Saas on: 07/22/2011 10:56 AM   Modules accepted: Orders

## 2011-07-22 NOTE — Telephone Encounter (Signed)
See phone note on 07/17/11

## 2011-07-22 NOTE — Telephone Encounter (Signed)
Scheduled patient with [facility] endo(Karen) on 07/28/11 at 7:45 AM for Capsule Endo(patient has a pacemaker)Booking number [id number]. Spoke with Joycelyn Schmid in bed control and scheduled patient an telemetry observation bed after procedure.

## 2011-07-22 NOTE — Telephone Encounter (Signed)
Spoke with patient and her daughter. Patient will have capsule endo at [facility] on 08/05/11 at 8:00 AM and then go to an telemetry  obs bed until capsule is passed. Patient will come in for RN teaching on 07/25/11 at 2:00 PM.

## 2011-07-22 NOTE — Telephone Encounter (Signed)
Patient is on iron. Moved Capsule endo to 08/05/11 at 8:00 AM. Spoke with Maudie Mercury in bed control and changed Obs bed to 08/05/11 also.

## 2011-07-25 ENCOUNTER — Ambulatory Visit (INDEPENDENT_AMBULATORY_CARE_PROVIDER_SITE_OTHER): Payer: PRIVATE HEALTH INSURANCE | Admitting: Internal Medicine

## 2011-07-25 ENCOUNTER — Encounter: Payer: Self-pay | Admitting: Internal Medicine

## 2011-07-25 DIAGNOSIS — D649 Anemia, unspecified: Secondary | ICD-10-CM

## 2011-07-25 NOTE — Patient Instructions (Signed)
Pt here for teaching for her Capsule Endoscopy on 08/05/11 at 8am. Pt will be an OBS Pt on Telemetry because she has a Pacemaker. She was instructed to check in at the Admitting Desk at [facility] at [place]. Pt was shown the Capsule she will swallow and was hesitant stating she didn't think she could swallow it, but later she stated she will try. I went over the prep with her and diet the day before and the day of the procedure. Pt stated understanding and will call for further questions.

## 2011-07-30 ENCOUNTER — Ambulatory Visit: Payer: PRIVATE HEALTH INSURANCE | Admitting: Internal Medicine

## 2011-08-05 ENCOUNTER — Telehealth: Payer: Self-pay | Admitting: *Deleted

## 2011-08-05 ENCOUNTER — Observation Stay (HOSPITAL_COMMUNITY)
Admission: RE | Admit: 2011-08-05 | Discharge: 2011-08-05 | Disposition: A | Discharge status: Home / Self Care | Payer: PRIVATE HEALTH INSURANCE | Source: Ambulatory Visit | Attending: Internal Medicine | Admitting: Internal Medicine

## 2011-08-05 DIAGNOSIS — K922 Gastrointestinal hemorrhage, unspecified: Secondary | ICD-10-CM

## 2011-08-05 DIAGNOSIS — Z538 Procedure and treatment not carried out for other reasons: Secondary | ICD-10-CM | POA: Insufficient documentation

## 2011-08-05 DIAGNOSIS — Z1389 Encounter for screening for other disorder: Principal | ICD-10-CM | POA: Insufficient documentation

## 2011-08-05 NOTE — Telephone Encounter (Signed)
Received a called from Azucena Freed that patient had tried yogurt and multiple cups of water and could not get the capsule endo pill down. Patient is being sent home. Do you want to schedule her back and place the pill with scope?

## 2011-08-05 NOTE — Telephone Encounter (Signed)
Scheduled patient at [facility] endo(Jill) for endoscopic placement of capsule on 08/26/11 at 9:15 AM. Booking number [id number]. Called bed control and reserved telemetry bed after procedure(Margo) Patient notified of date and mailed her a new instruction sheet with new dates.

## 2011-08-05 NOTE — Telephone Encounter (Signed)
Received a call from [facility] end[facility] that patient cannot swallow the pill. She has tried with 2 cups of water. Spoke with Dr. [name] okay to try a little yogurt to get the pill down. They will try this.

## 2011-08-05 NOTE — Telephone Encounter (Signed)
Yes, please, reschedule for endoscopic placement of the capsule when I am the hospital MD

## 2011-08-08 ENCOUNTER — Ambulatory Visit: Payer: PRIVATE HEALTH INSURANCE | Admitting: Internal Medicine

## 2011-08-26 ENCOUNTER — Observation Stay (HOSPITAL_COMMUNITY)
Admission: RE | Admit: 2011-08-26 | Discharge: 2011-08-26 | Disposition: A | Discharge status: Home / Self Care | Payer: PRIVATE HEALTH INSURANCE | Source: Ambulatory Visit | Attending: Internal Medicine | Admitting: Internal Medicine

## 2011-08-26 ENCOUNTER — Encounter: Payer: PRIVATE HEALTH INSURANCE | Admitting: Internal Medicine

## 2011-08-26 DIAGNOSIS — I509 Heart failure, unspecified: Secondary | ICD-10-CM | POA: Insufficient documentation

## 2011-08-26 DIAGNOSIS — I4891 Unspecified atrial fibrillation: Secondary | ICD-10-CM | POA: Insufficient documentation

## 2011-08-26 DIAGNOSIS — D649 Anemia, unspecified: Secondary | ICD-10-CM | POA: Insufficient documentation

## 2011-08-26 DIAGNOSIS — I252 Old myocardial infarction: Secondary | ICD-10-CM | POA: Insufficient documentation

## 2011-08-26 DIAGNOSIS — N289 Disorder of kidney and ureter, unspecified: Secondary | ICD-10-CM | POA: Insufficient documentation

## 2011-08-26 DIAGNOSIS — Z950 Presence of cardiac pacemaker: Secondary | ICD-10-CM | POA: Insufficient documentation

## 2011-08-26 DIAGNOSIS — K5521 Angiodysplasia of colon with hemorrhage: Secondary | ICD-10-CM

## 2011-08-26 DIAGNOSIS — Z01812 Encounter for preprocedural laboratory examination: Secondary | ICD-10-CM | POA: Insufficient documentation

## 2011-08-26 DIAGNOSIS — K31811 Angiodysplasia of stomach and duodenum with bleeding: Principal | ICD-10-CM | POA: Insufficient documentation

## 2011-08-26 DIAGNOSIS — D509 Iron deficiency anemia, unspecified: Secondary | ICD-10-CM

## 2011-08-26 DIAGNOSIS — I10 Essential (primary) hypertension: Secondary | ICD-10-CM | POA: Insufficient documentation

## 2011-08-26 LAB — CBC
MCHC: 32.6 g/dL (ref 30.0–36.0)
MCV: 92.7 fL (ref 78.0–100.0)
Platelets: 191 10*3/uL (ref 150–400)
RDW: 14.6 % (ref 11.5–15.5)
WBC: 3.3 10*3/uL — ABNORMAL LOW (ref 4.0–10.5)

## 2011-08-26 LAB — DIFFERENTIAL
Basophils Absolute: 0 10*3/uL (ref 0.0–0.1)
Eosinophils Absolute: 0.1 10*3/uL (ref 0.0–0.7)
Eosinophils Relative: 2 % (ref 0–5)
Monocytes Absolute: 0.4 10*3/uL (ref 0.1–1.0)

## 2011-08-28 ENCOUNTER — Other Ambulatory Visit: Payer: Self-pay | Admitting: Family Medicine

## 2011-08-28 NOTE — Telephone Encounter (Signed)
Refill request

## 2011-09-01 ENCOUNTER — Ambulatory Visit (INDEPENDENT_AMBULATORY_CARE_PROVIDER_SITE_OTHER): Payer: PRIVATE HEALTH INSURANCE | Admitting: Internal Medicine

## 2011-09-01 ENCOUNTER — Encounter: Payer: Self-pay | Admitting: Internal Medicine

## 2011-09-01 DIAGNOSIS — I5032 Chronic diastolic (congestive) heart failure: Secondary | ICD-10-CM

## 2011-09-01 DIAGNOSIS — Z720 Tobacco use: Secondary | ICD-10-CM

## 2011-09-01 DIAGNOSIS — Z950 Presence of cardiac pacemaker: Secondary | ICD-10-CM

## 2011-09-01 DIAGNOSIS — I442 Atrioventricular block, complete: Secondary | ICD-10-CM

## 2011-09-01 DIAGNOSIS — F17200 Nicotine dependence, unspecified, uncomplicated: Secondary | ICD-10-CM

## 2011-09-01 DIAGNOSIS — I509 Heart failure, unspecified: Secondary | ICD-10-CM

## 2011-09-01 DIAGNOSIS — I4891 Unspecified atrial fibrillation: Secondary | ICD-10-CM

## 2011-09-01 LAB — PACEMAKER DEVICE OBSERVATION
BMOD-0002RV: 10
BRDY-0002RV: 70 {beats}/min
BRDY-0004RV: 120 {beats}/min
DEVICE MODEL PM: 2406848
RV LEAD THRESHOLD: 1 V

## 2011-09-01 NOTE — Progress Notes (Signed)
The patient presents today for routine electrophysiology followup.  Since last being seen in our clinic, the patient reports doing very well.  Her SOB has significantly improved and her edema has resolved.  She denies further GI bleeding but has dark stools with iron supplementation. She has an ongoing GI evaluation by Dr [name].  She has rare palpitations. Today, she denies symptoms of  chest pain, shortness of breath, orthopnea, PND, lower extremity edema, dizziness, presyncope, syncope, or neurologic sequela.  The patient feels that she is tolerating medications without difficulties and is otherwise without complaint today.   Past Medical History  Diagnosis Date  . Angioedema     2/2 ACE  . Tobacco dependence     Quit January 2012; tried 1 cigarette March 2012, did not enjoy,  resolved to stay quit.   [name] RLS (restless legs syndrome)     Dx 06/2007  . Anemia   . Arteriovenous malformation of stomach   . Permanent atrial fibrillation   . Renal failure     baseline creatinine 1.6  . Hyperlipidemia   . Depression   . Diastolic heart failure   . Arthritis   . Sleep apnea   . Asthma   . Hx of colonic polyp 08/13/10    adenomatous  . Fatty liver 07/26/10  . Panic attacks   . Hypertension   . Complete heart block     s/p PPM 1998  . Hypertrophic cardiomyopathy     dx by Dr [name] 2009  . Hx of colonoscopy   . GI bleed   . Panic attack   . DDD (degenerative disc disease)    Past Surgical History  Procedure Date  . Tubal ligation 04/01/2000  . Pacemaker insertion [place], most recent gen change by [name] 4/12  . Cardiac catheterization     Current Outpatient Prescriptions  Medication Sig Dispense Refill  . acetaminophen (TYLENOL) 325 MG tablet Take 650 mg by mouth every 6 (six) hours as needed.        [name] albuterol (PROAIR HFA) 108 (90 BASE) MCG/ACT inhaler two puffs every 4-6 hours as needed       . ALPRAZolam (XANAX) 0.5 MG tablet Take 0.5 mg by mouth at bedtime as needed.  For anxiety       . aspirin 81 MG EC tablet Take 81 mg by mouth daily.        . carvedilol (COREG) 6.25 MG tablet TAKE 1 TABLET BY MOUTH TWICE DAILY WITH MEALS  60 tablet  2  . diltiazem (CARDIZEM) 90 MG tablet TAKE 1 TABLET BY MOUTH DAILY  30 tablet  2  . ferrous sulfate 325 (65 FE) MG tablet TAKE 1 TABLET BY MOUTH TWICE DAILY  60 tablet  0  . furosemide (LASIX) 40 MG tablet Take 40 mg by mouth daily.        . Multiple Vitamins-Minerals (CENTRUM) tablet Take 1 tablet by mouth daily.        . Olopatadine HCl (PATADAY) 0.2 % SOLN Apply 1 drop to eye daily.  1 Bottle  5  . Omega-3 Fatty Acids (FISH OIL PO) Take by mouth. Take 2 capsules by mouth daily       . omeprazole (PRILOSEC) 20 MG capsule Take 20 mg by mouth 2 (two) times daily.       . Probiotic Product (SUPER PROBIOTIC PO) Take 1 capsule by mouth daily.        . rosuvastatin (CRESTOR) 10 MG  tablet Take 10 mg by mouth daily.        [name] senna (SENOKOT) 8.6 MG tablet Take 1 tablet by mouth daily.        . vitamin C (ASCORBIC ACID) 500 MG tablet Take 500 mg by mouth daily.          Allergies  Allergen Reactions  . Ace Inhibitors     REACTION: angioedema    History   Social History  . Marital Status: Divorced    Spouse Name: N/A    Number of Children: 73  . Years of Education: N/A   Occupational History  . disabled    Social History Main Topics  . Smoking status: Current Everyday Smoker -- 0.1 packs/day    Types: Cigarettes    Last Attempt to Quit: 02/23/2011  . Smokeless tobacco: Never Used   Comment: Made decision to quit after hospitalization. Relapsed a few days ago and had one "drag" but found it unpleasant. Has not smoked since then.   . Alcohol Use: 1.5 oz/week    3 drink(s) per week  . Drug Use: No  . Sexually Active: Not on file   Other Topics Concern  . Not on file   Social History Narrative   On disability for heart failure/pacemaker.  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 4 grown children,  8  grandchildren.  Drinks alcohol a few times a week socially. At one time, the most she reports drinking is about 3 mixed drinks.Lives with daughter [name] and son. Will be moving 04/2011 to different house because concerned children are taking advantage of her financial situation. No illicit drugs.Smokes few cigarettes daily, especially when stressed. Started smoking @ age 72. Quit January 2012 2/2 health but started smoking again 02/2011.     Family History  Problem Relation Age of Onset  . Hypertension Mother   . Stroke Mother   . Hypertension Father   . Stroke Father   . Hypertension Sister   . Hypertension Brother   . Colon cancer Brother   . Diabetes Sister   . Diabetes Brother   . Cancer Brother   . Heart attack Brother   . Heart attack Father   . Heart disease Mother   . Heart disease Sister   . Alcohol abuse Brother   . Alcohol abuse Father     ROS-  All systems are reviewed and are negative except as outlined in the HPI above    Physical Exam: Filed Vitals:   09/01/11 1429  BP: 132/90  Pulse: 69  Height: 5\' 5"  (1.651 m)  Weight: 183 lb (83.008 kg)    GEN- The patient is well appearing, alert and oriented x 3 today.   Head- normocephalic, atraumatic Eyes-  Sclera clear, conjunctiva pink Ears- hearing intact Oropharynx- clear Neck- supple, no JVP Lymph- no cervical lymphadenopathy Lungs- Clear to ausculation bilaterally, normal work of breathing Chest- pacemaker pocket is well healed Heart- Regular rate and rhythm (paced) GI- soft, NT, ND, + BS Extremities- no clubbing, cyanosis, or edema MS- no significant deformity or atrophy Skin- no rash or lesion Psych- euthymic mood, full affect Neuro- strength and sensation are intact  Pacemaker interrogation- reviewed in detail today,  See PACEART report  Assessment and Plan:

## 2011-09-01 NOTE — Patient Instructions (Signed)
Your physician recommends that you schedule a follow-up appointment in: 3 months  Your physician has recommended you make the following change in your medication:  Start xarelto if your capsule endoscopy results are ok.

## 2011-09-01 NOTE — Assessment & Plan Note (Signed)
Permanent atrial fibrillation She is well rate controlled She has increased stroke risks and therefore should be chronically anticoagulated.  Unfortuantely, she previously had GI bleeding with coumadin. I would like to consider restarting coumadin vs low dose (15mg  Xarelto).  She recently underwent capsule endoscopy, though results are pending.  If the patient is felt to be a candidate to retry anticoagulation, I would like to start  Xarelto 15mg  daily.  Continue ASA for now and start Xarelto if ok with Dr [name].

## 2011-09-01 NOTE — Assessment & Plan Note (Signed)
Normal pacemaker function See Pace Art report No changes today  

## 2011-09-01 NOTE — Assessment & Plan Note (Signed)
Cessation advised today She is not ready to quit

## 2011-09-01 NOTE — Assessment & Plan Note (Signed)
Much improved No changes today

## 2011-09-08 LAB — URINE MICROSCOPIC-ADD ON

## 2011-09-08 LAB — URINALYSIS, ROUTINE W REFLEX MICROSCOPIC
Glucose, UA: NEGATIVE
Protein, ur: 30 — AB
pH: 7

## 2011-09-10 ENCOUNTER — Encounter: Payer: Self-pay | Admitting: Family Medicine

## 2011-09-10 ENCOUNTER — Ambulatory Visit (INDEPENDENT_AMBULATORY_CARE_PROVIDER_SITE_OTHER): Payer: PRIVATE HEALTH INSURANCE | Admitting: Family Medicine

## 2011-09-10 DIAGNOSIS — I4891 Unspecified atrial fibrillation: Secondary | ICD-10-CM

## 2011-09-10 DIAGNOSIS — F411 Generalized anxiety disorder: Secondary | ICD-10-CM

## 2011-09-10 DIAGNOSIS — I5032 Chronic diastolic (congestive) heart failure: Secondary | ICD-10-CM

## 2011-09-10 DIAGNOSIS — I509 Heart failure, unspecified: Secondary | ICD-10-CM

## 2011-09-10 DIAGNOSIS — E785 Hyperlipidemia, unspecified: Secondary | ICD-10-CM

## 2011-09-10 DIAGNOSIS — H1045 Other chronic allergic conjunctivitis: Secondary | ICD-10-CM

## 2011-09-10 DIAGNOSIS — Z720 Tobacco use: Secondary | ICD-10-CM

## 2011-09-10 DIAGNOSIS — H1013 Acute atopic conjunctivitis, bilateral: Secondary | ICD-10-CM

## 2011-09-10 DIAGNOSIS — N183 Chronic kidney disease, stage 3 unspecified: Secondary | ICD-10-CM

## 2011-09-10 DIAGNOSIS — I10 Essential (primary) hypertension: Secondary | ICD-10-CM

## 2011-09-10 DIAGNOSIS — F17200 Nicotine dependence, unspecified, uncomplicated: Secondary | ICD-10-CM

## 2011-09-10 LAB — CARDIAC PANEL(CRET KIN+CKTOT+MB+TROPI)
CK, MB: 2.5
CK, MB: 3.4
Relative Index: INVALID
Relative Index: INVALID
Relative Index: INVALID
Total CK: 54
Total CK: 64
Total CK: 81
Troponin I: 0.35 — ABNORMAL HIGH

## 2011-09-10 LAB — CBC
HCT: 33.8 — ABNORMAL LOW
Hemoglobin: 10.2 — ABNORMAL LOW
Hemoglobin: 11.3 — ABNORMAL LOW
MCHC: 33.4
MCHC: 33.6
MCV: 87.4
MCV: 88.7
Platelets: 287
Platelets: 287
RBC: 3.45 — ABNORMAL LOW
RBC: 3.81 — ABNORMAL LOW
RBC: 4.07
RDW: 16.4 — ABNORMAL HIGH
RDW: 16.4 — ABNORMAL HIGH
WBC: 10.8 — ABNORMAL HIGH
WBC: 12 — ABNORMAL HIGH
WBC: 6.5

## 2011-09-10 LAB — LIPID PANEL
LDL Cholesterol: 78
Total CHOL/HDL Ratio: 2.2
VLDL: 13

## 2011-09-10 LAB — D-DIMER, QUANTITATIVE: D-Dimer, Quant: 0.97 — ABNORMAL HIGH

## 2011-09-10 LAB — CULTURE, BLOOD (ROUTINE X 2): Culture: NO GROWTH

## 2011-09-10 LAB — BASIC METABOLIC PANEL
BUN: 30 — ABNORMAL HIGH
Calcium: 9.4
Calcium: 9.4
Creatinine, Ser: 1.28 — ABNORMAL HIGH
GFR calc Af Amer: 51 — ABNORMAL LOW
GFR calc Af Amer: 51 — ABNORMAL LOW
GFR calc non Af Amer: 42 — ABNORMAL LOW
Glucose, Bld: 190 — ABNORMAL HIGH
Sodium: 137

## 2011-09-10 LAB — BLOOD GAS, ARTERIAL
Acid-Base Excess: 0.3
FIO2: 0.4
O2 Saturation: 94.9
Patient temperature: 98.6

## 2011-09-10 LAB — B-NATRIURETIC PEPTIDE (CONVERTED LAB)
Pro B Natriuretic peptide (BNP): 206 — ABNORMAL HIGH
Pro B Natriuretic peptide (BNP): 892 — ABNORMAL HIGH

## 2011-09-10 LAB — DIFFERENTIAL
Eosinophils Relative: 2
Lymphocytes Relative: 42
Lymphs Abs: 3.3
Monocytes Relative: 7

## 2011-09-10 LAB — INFLUENZA A+B VIRUS AG-DIRECT(RAPID): Inflenza A Ag: NEGATIVE

## 2011-09-10 LAB — DRUGS OF ABUSE SCREEN W/O ALC, ROUTINE URINE
Creatinine,U: 38.7
Marijuana Metabolite: NEGATIVE
Methadone: NEGATIVE
Opiate Screen, Urine: NEGATIVE

## 2011-09-10 LAB — COMPREHENSIVE METABOLIC PANEL
ALT: 38 — ABNORMAL HIGH
ALT: <8 U/L (ref 0–35)
AST: 56 — ABNORMAL HIGH
AST: 17 U/L (ref 0–37)
Alkaline Phosphatase: 48 U/L (ref 39–117)
BUN: 28 — ABNORMAL HIGH
CO2: 23
CO2: 29
Calcium: 8.9
Calcium: 9.7 mg/dL (ref 8.4–10.5)
Chloride: 99
Chloride: 106 mEq/L (ref 96–112)
Creat: 1.27 mg/dL — ABNORMAL HIGH (ref 0.50–1.10)
Creatinine, Ser: 1.29 — ABNORMAL HIGH
Creatinine, Ser: 1.5 — ABNORMAL HIGH
GFR calc Af Amer: 42 — ABNORMAL LOW
GFR calc non Af Amer: 35 — ABNORMAL LOW
GFR calc non Af Amer: 42 — ABNORMAL LOW
Glucose, Bld: 113 — ABNORMAL HIGH
Potassium: 3.8 mEq/L (ref 3.5–5.3)
Sodium: 138
Total Bilirubin: 0.6
Total Protein: 7.2

## 2011-09-10 LAB — HEPARIN LEVEL (UNFRACTIONATED)
Heparin Unfractionated: 0.67
Heparin Unfractionated: 1.05 — ABNORMAL HIGH

## 2011-09-10 LAB — POCT I-STAT 3, ART BLOOD GAS (G3+)
Acid-base deficit: 2
O2 Saturation: 72
Operator id: 299431
Patient temperature: 98.6
pO2, Arterial: 40 — ABNORMAL LOW

## 2011-09-10 LAB — HEMOGLOBIN A1C
Hgb A1c MFr Bld: 6.1
Mean Plasma Glucose: 140

## 2011-09-10 LAB — CK TOTAL AND CKMB (NOT AT ARMC)
Relative Index: INVALID
Total CK: 95

## 2011-09-10 LAB — TROPONIN I: Troponin I: 0.21 — ABNORMAL HIGH

## 2011-09-10 LAB — LEGIONELLA ANTIGEN, URINE

## 2011-09-10 LAB — LDL CHOLESTEROL, DIRECT: Direct LDL: 94 mg/dL

## 2011-09-10 MED ORDER — FUROSEMIDE 40 MG PO TABS: 40.0000 mg | ORAL_TABLET | Freq: Every day | ORAL | Status: DC

## 2011-09-10 MED ORDER — FERROUS SULFATE 325 (65 FE) MG PO TABS: 325.0000 mg | ORAL_TABLET | Freq: Two times a day (BID) | ORAL | Status: DC

## 2011-09-10 MED ORDER — ROSUVASTATIN CALCIUM 20 MG PO TABS: 20.0000 mg | ORAL_TABLET | Freq: Every day | ORAL | Status: DC

## 2011-09-10 MED ORDER — ALPRAZOLAM 0.5 MG PO TABS: 0.5000 mg | ORAL_TABLET | Freq: Every evening | ORAL | Status: DC | PRN

## 2011-09-10 MED ORDER — CARVEDILOL 6.25 MG PO TABS: 6.2500 mg | ORAL_TABLET | Freq: Two times a day (BID) | ORAL | Status: DC

## 2011-09-10 MED ORDER — DILTIAZEM HCL 90 MG PO TABS: 90.0000 mg | ORAL_TABLET | Freq: Every day | ORAL | Status: DC

## 2011-09-10 MED ORDER — OLOPATADINE HCL 0.2 % OP SOLN: 1.0000 [drp] | Freq: Every day | OPHTHALMIC | Status: DC

## 2011-09-10 MED ORDER — OMEPRAZOLE 20 MG PO CPDR: 20.0000 mg | DELAYED_RELEASE_CAPSULE | Freq: Two times a day (BID) | ORAL | Status: DC

## 2011-09-10 NOTE — Patient Instructions (Signed)
It was nice to see you again.  We will refer you to a skin doctor (dermatologist) for the bump on your elbow. We will call you with the appointment time and date.  If your lab results are normal, I will send you a letter with the results. If abnormal, someone at the clinic will get in touch with you.   See me again in 6 months please.  Please stop smoking.

## 2011-09-11 ENCOUNTER — Encounter: Payer: Self-pay | Admitting: Family Medicine

## 2011-09-11 LAB — CBC
HCT: 34.9 — ABNORMAL LOW
HCT: 35.3 — ABNORMAL LOW
Hemoglobin: 11.7 — ABNORMAL LOW
MCHC: 34.4
MCV: 87.9
Platelets: 276
RDW: 16.5 — ABNORMAL HIGH
RDW: 16.8 — ABNORMAL HIGH
WBC: 4.7
WBC: 5

## 2011-09-11 LAB — PROTIME-INR
INR: 0.9
Prothrombin Time: 12.8

## 2011-09-11 LAB — BASIC METABOLIC PANEL
BUN: 28 — ABNORMAL HIGH
Calcium: 9.5
Chloride: 101
Creatinine, Ser: 1.38 — ABNORMAL HIGH
GFR calc Af Amer: 47 — ABNORMAL LOW
GFR calc non Af Amer: 39 — ABNORMAL LOW
Glucose, Bld: 113 — ABNORMAL HIGH
Glucose, Bld: 116 — ABNORMAL HIGH
Potassium: 3.8
Potassium: 3.9
Sodium: 138

## 2011-09-11 LAB — POCT I-STAT 3, VENOUS BLOOD GAS (G3P V)
Acid-Base Excess: 3 — ABNORMAL HIGH
Operator id: 250901
pH, Ven: 7.424 — ABNORMAL HIGH

## 2011-09-11 LAB — POCT I-STAT 3, ART BLOOD GAS (G3+)
Acid-Base Excess: 3 — ABNORMAL HIGH
O2 Saturation: 91
TCO2: 28
pCO2 arterial: 38.9

## 2011-09-11 LAB — HEPARIN LEVEL (UNFRACTIONATED): Heparin Unfractionated: 0.38

## 2011-09-11 LAB — APTT: aPTT: 55 — ABNORMAL HIGH

## 2011-09-11 NOTE — Assessment & Plan Note (Signed)
Stable on Xanax, which she uses few times a week. Not interested in SSRI or controller medication for anxiety at this time but not opposed to idea. Think current use of Xanax is appropriate, so will continue this for now.

## 2011-09-11 NOTE — Progress Notes (Signed)
  Subjective:    Patient ID: [name], female    DOB: [birth year], 65 y.o.   MRN: [id number]  HPI This is a 65 YO AA F w h/o anxiety, S3CKD, AF/chronic diastolic HF, tobacco abuse, OSA (?), HLD p/w follow-up and medication refills.  1. Anxiety Stable on Xanax. Depends on situation how much she takes. Does not take daily. Maybe every few days.   2. S3CKD Denies blood in urine, dysuria, urinary/frequency/urgency, or any urinary problems.  3. Cardiac issues Followed by [facility]  4. Tobacco abuse Still smoking but now only 2-3 cigs/day. Not interested quitting at this time.  5. HLD Compliant with Crestor. Dose increased by Cardiologist recently. ROS: denies RUQ pain, myalgias  Review of Systems Per HPI with inclusion of following: denies CP, DOE    Objective:   Physical Exam  Constitutional: She is oriented to person, place, and time. No distress.       obese  Neck: Normal range of motion. Neck supple.  Cardiovascular: Normal rate, regular rhythm and intact distal pulses.  Exam reveals no gallop.   Murmur heard.      3/6 systolic murmur upper right sternal border   Pulmonary/Chest: Effort normal and breath sounds normal. No respiratory distress. She has no wheezes. She has no rales.  Abdominal: Soft. Bowel sounds are normal. She exhibits no distension. There is no tenderness.       obese  Musculoskeletal: She exhibits no edema.  Lymphadenopathy:    She has no cervical adenopathy.  Neurological: She is alert and oriented to person, place, and time. She exhibits normal muscle tone. Coordination normal.  Psychiatric: Her speech is normal and behavior is normal. Judgment and thought content normal. Her affect is not angry, not blunt, not labile and not inappropriate. Cognition and memory are normal. She does not exhibit a depressed mood.       Slightly anxious-appearing (usual for her)       Assessment & Plan:

## 2011-09-11 NOTE — Assessment & Plan Note (Signed)
Counseled.Not ready to quit. But has cut back to now 2-3 cigarettes a day.  I think she smokes partly to relieve anxiety. This would be one reason to start controller medication (SSRI/SNRI), but she is not interested now.

## 2011-09-11 NOTE — Assessment & Plan Note (Signed)
Cr good today (1.2; has been around 1.5 in the recent past).

## 2011-09-11 NOTE — Assessment & Plan Note (Signed)
LDL improved from 140 to 97. Continue Crestor at increased dose of 20mg .

## 2011-09-12 ENCOUNTER — Encounter: Payer: Self-pay | Admitting: Internal Medicine

## 2011-09-12 ENCOUNTER — Inpatient Hospital Stay (HOSPITAL_COMMUNITY)
Admission: EM | Admit: 2011-09-12 | Discharge: 2011-09-14 | DRG: 291 | Disposition: A | Discharge status: Home / Self Care | Payer: PRIVATE HEALTH INSURANCE | Attending: Family Medicine | Admitting: Family Medicine

## 2011-09-12 ENCOUNTER — Emergency Department (HOSPITAL_COMMUNITY): Payer: PRIVATE HEALTH INSURANCE

## 2011-09-12 DIAGNOSIS — F411 Generalized anxiety disorder: Secondary | ICD-10-CM | POA: Diagnosis present

## 2011-09-12 DIAGNOSIS — A498 Other bacterial infections of unspecified site: Secondary | ICD-10-CM | POA: Diagnosis present

## 2011-09-12 DIAGNOSIS — N189 Chronic kidney disease, unspecified: Secondary | ICD-10-CM | POA: Diagnosis present

## 2011-09-12 DIAGNOSIS — F17200 Nicotine dependence, unspecified, uncomplicated: Secondary | ICD-10-CM | POA: Diagnosis present

## 2011-09-12 DIAGNOSIS — I129 Hypertensive chronic kidney disease with stage 1 through stage 4 chronic kidney disease, or unspecified chronic kidney disease: Secondary | ICD-10-CM | POA: Diagnosis present

## 2011-09-12 DIAGNOSIS — E785 Hyperlipidemia, unspecified: Secondary | ICD-10-CM | POA: Diagnosis present

## 2011-09-12 DIAGNOSIS — Z950 Presence of cardiac pacemaker: Secondary | ICD-10-CM

## 2011-09-12 DIAGNOSIS — I4891 Unspecified atrial fibrillation: Secondary | ICD-10-CM | POA: Diagnosis present

## 2011-09-12 DIAGNOSIS — N390 Urinary tract infection, site not specified: Secondary | ICD-10-CM | POA: Diagnosis present

## 2011-09-12 DIAGNOSIS — D649 Anemia, unspecified: Secondary | ICD-10-CM | POA: Diagnosis present

## 2011-09-12 DIAGNOSIS — M129 Arthropathy, unspecified: Secondary | ICD-10-CM | POA: Diagnosis present

## 2011-09-12 DIAGNOSIS — J189 Pneumonia, unspecified organism: Secondary | ICD-10-CM | POA: Diagnosis present

## 2011-09-12 DIAGNOSIS — I5033 Acute on chronic diastolic (congestive) heart failure: Principal | ICD-10-CM | POA: Diagnosis present

## 2011-09-12 DIAGNOSIS — J441 Chronic obstructive pulmonary disease with (acute) exacerbation: Secondary | ICD-10-CM | POA: Diagnosis present

## 2011-09-12 DIAGNOSIS — Z79899 Other long term (current) drug therapy: Secondary | ICD-10-CM

## 2011-09-12 DIAGNOSIS — I509 Heart failure, unspecified: Secondary | ICD-10-CM | POA: Diagnosis present

## 2011-09-12 DIAGNOSIS — Z7982 Long term (current) use of aspirin: Secondary | ICD-10-CM

## 2011-09-12 LAB — DIFFERENTIAL
Basophils Absolute: 0 10*3/uL (ref 0.0–0.1)
Basophils Relative: 1 % (ref 0–1)
Eosinophils Absolute: 0.1 10*3/uL (ref 0.0–0.7)
Monocytes Absolute: 0.4 10*3/uL (ref 0.1–1.0)
Monocytes Relative: 8 % (ref 3–12)
Neutro Abs: 3 10*3/uL (ref 1.7–7.7)
Neutrophils Relative %: 67 % (ref 43–77)

## 2011-09-12 LAB — COMPREHENSIVE METABOLIC PANEL
Albumin: 3.7 g/dL (ref 3.5–5.2)
Alkaline Phosphatase: 48 U/L (ref 39–117)
BUN: 15 mg/dL (ref 6–23)
Calcium: 9.8 mg/dL (ref 8.4–10.5)
Creatinine, Ser: 1.03 mg/dL (ref 0.50–1.10)
GFR calc Af Amer: >60 mL/min (ref >60)
Glucose, Bld: 110 mg/dL — ABNORMAL HIGH (ref 70–99)
Total Protein: 7.3 g/dL (ref 6.0–8.3)

## 2011-09-12 LAB — CBC
MCH: 30.4 pg (ref 26.0–34.0)
MCHC: 32.8 g/dL (ref 30.0–36.0)
Platelets: 240 10*3/uL (ref 150–400)

## 2011-09-12 LAB — URINALYSIS, ROUTINE W REFLEX MICROSCOPIC
Glucose, UA: NEGATIVE mg/dL
Hgb urine dipstick: NEGATIVE
Protein, ur: 100 mg/dL — AB
Specific Gravity, Urine: 1.02 (ref 1.005–1.030)
Urobilinogen, UA: 1 mg/dL (ref 0.0–1.0)

## 2011-09-12 LAB — POCT I-STAT TROPONIN I: Troponin i, poc: 0.17 ng/mL (ref 0.00–0.08)

## 2011-09-12 LAB — CK TOTAL AND CKMB (NOT AT ARMC)
CK, MB: 3.2 ng/mL (ref 0.3–4.0)
Relative Index: INVALID (ref 0.0–2.5)
Total CK: 66 U/L (ref 7–177)

## 2011-09-12 LAB — URINE MICROSCOPIC-ADD ON

## 2011-09-12 LAB — PRO B NATRIURETIC PEPTIDE: Pro B Natriuretic peptide (BNP): 2730 pg/mL — ABNORMAL HIGH (ref 0–125)

## 2011-09-12 NOTE — H&P (Signed)
Family Medicine Teaching Service History and Physical  Primary Care Provider: Karen Kays, MD  Chief Complaint: Shortness of breath.    Subjective: She is a 65 y.o. female presents with shortness of breath and wheezing. Symptoms include chest pain, located left lateral chest, dyspnea on exertion, sputum production and wheezing.  Symptoms began 5 days ago, gradually worsening since.  She denies edema of LE and post nasal drip. Associated symptoms include dyspnea, productive cough and wheezing.  She has not had recent travel.  Weight has been stable.  Appetite has been unaffected. Symptoms are exacerbated by any exercise.  Symptoms are alleviated by rest, oxygen and medication(s) (albuterol). She has treated this current exacerbation with: Several albuterol treatments at home. She has not had any adverse reactions or side effects to medications. She currently smokes .25 packs per day.  Patient complains that she has some pain in her left lateral chest that feels like it is in her ribs.  She says she does not feel like it is a heart attack, however she does endorse worsened dyspnea on exertion.  She denies fevers, urinary symptoms, n/v/diarrhea.  She endorses constipation.   Patient Active Problem List  Diagnoses Date Noted  . Complete heart block 04/03/2011  . Tobacco abuse 02/26/2011  . ACUTE ON CHRONIC DIASTOLIC HEART FAILURE [id number]  . Anemia 02/17/2011  . GI BLEEDING 02/13/2011  . PACEMAKER 11/28/2010  . CHRONIC KIDNEY DISEASE STAGE III (MODERATE) 09/24/2010  . Chronic diastolic heart failure [id number]  . ARTHRITIS 07/24/2010  . ANXIETY 07/23/2010  . Atrial fibrillation 07/16/2010  . HYPERLIPIDEMIA 02/11/2007  . HYPERTENSION, BENIGN SYSTEMIC 02/11/2007  . APNEA, SLEEP 02/11/2007   Past Medical History  Diagnosis Date  . Angioedema     2/2 ACE  . Tobacco dependence     Quit January 2012; tried 1 cigarette March 2012, did not enjoy,  resolved to stay quit.   [name] RLS (restless  legs syndrome)     Dx 06/2007  . Anemia   . Arteriovenous malformation of stomach   . Permanent atrial fibrillation   . Renal failure     baseline creatinine 1.6  . Hyperlipidemia   . Depression   . Diastolic heart failure   . Arthritis   . Sleep apnea   . Asthma   . Hx of colonic polyp 08/13/10    adenomatous  . Fatty liver 07/26/10  . Panic attacks   . Hypertension   . Complete heart block     s/p PPM 1998  . Hypertrophic cardiomyopathy     dx by Dr [name] 2009  . Hx of colonoscopy   . GI bleed   . Panic attack   . DDD (degenerative disc disease)     Past Surgical History  Procedure Date  . Tubal ligation 04/01/2000  . Pacemaker insertion [place], most recent gen change by [name] 4/12  . Cardiac catheterization      (Not in a hospital admission) Allergies  Allergen Reactions  . Ace Inhibitors     REACTION: angioedema    History  Substance Use Topics  . Smoking status: Current Everyday Smoker -- 0.1 packs/day    Types: Cigarettes    Last Attempt to Quit: 02/23/2011  . Smokeless tobacco: Never Used   Comment: Made decision to quit after hospitalization. Relapsed a few days ago and had one "drag" but found it unpleasant. Has not smoked since then.   . Alcohol Use: 1.5 oz/week    3  drink(s) per week    Family History  Problem Relation Age of Onset  . Hypertension Mother   . Stroke Mother   . Hypertension Father   . Stroke Father   . Hypertension Sister   . Hypertension Brother   . Colon cancer Brother   . Diabetes Sister   . Diabetes Brother   . Cancer Brother   . Heart attack Brother   . Heart attack Father   . Heart disease Mother   . Heart disease Sister   . Alcohol abuse Brother   . Alcohol abuse Father     No current facility-administered medications on file prior to encounter.   Current Outpatient Prescriptions on File Prior to Encounter  Medication Sig Dispense Refill  . acetaminophen (TYLENOL) 325 MG tablet Take 650 mg by mouth every 6  (six) hours as needed.        [name] albuterol (PROAIR HFA) 108 (90 BASE) MCG/ACT inhaler two puffs every 4-6 hours as needed       . ALPRAZolam (XANAX) 0.5 MG tablet Take 1 tablet (0.5 mg total) by mouth at bedtime as needed. For anxiety  30 tablet  0  . aspirin 81 MG EC tablet Take 81 mg by mouth daily.        . carvedilol (COREG) 6.25 MG tablet Take 1 tablet (6.25 mg total) by mouth 2 (two) times daily with a meal.  60 tablet  6  . diltiazem (CARDIZEM) 90 MG tablet Take 1 tablet (90 mg total) by mouth daily.  30 tablet  2  . ferrous sulfate 325 (65 FE) MG tablet Take 1 tablet (325 mg total) by mouth 2 (two) times daily with a meal.  60 tablet  3  . furosemide (LASIX) 40 MG tablet Take 1 tablet (40 mg total) by mouth daily.  30 tablet  2  . Multiple Vitamins-Minerals (CENTRUM) tablet Take 1 tablet by mouth daily.        . Olopatadine HCl (PATADAY) 0.2 % SOLN Apply 1 drop to eye daily.  1 Bottle  5  . Omega-3 Fatty Acids (FISH OIL PO) Take by mouth. Take 2 capsules by mouth daily       . omeprazole (PRILOSEC) 20 MG capsule Take 1 capsule (20 mg total) by mouth 2 (two) times daily.  60 capsule  3  . Probiotic Product (SUPER PROBIOTIC PO) Take 1 capsule by mouth daily.        . rosuvastatin (CRESTOR) 20 MG tablet Take 1 tablet (20 mg total) by mouth daily.  30 tablet  3  . senna (SENOKOT) 8.6 MG tablet Take 1 tablet by mouth daily.        . vitamin C (ASCORBIC ACID) 500 MG tablet Take 500 mg by mouth daily.         Review of Systems Pertinent items are noted in HPI.  Objective: Vital signs:  T: 98.6 BP 160/100 P 85 RR 18 Pulse Ox 96% RA  General appearance: alert, cooperative and uncomfortable Eyes: conjunctivae/corneas clear. PERRL, EOM's intact. Fundi benign. Throat: oral mucosa moist, no lesions or exudates. Neck: no adenopathy, no carotid bruit, no JVD, supple, symmetrical, trachea midline and thyroid not enlarged, symmetric, no tenderness/mass/nodules Back: symmetric, no curvature. ROM  normal. No CVA tenderness. Lungs: wheezes expiratory throughout lung fields, poor air movement.  Heart: regular rate and rhythm, S1, S2 normal, no murmur, click, rub or gallop Abdomen: soft, non-tender; bowel sounds normal; no masses,  no organomegaly Extremities: extremities normal, atraumatic,  no cyanosis or edema Pulses: 2+ and symmetric Neurologic: Grossly normal MSK: + tenderness to palpation of left later chest, just under breast crease.   Chest X-Ray: cardiomegaly and pulmonary edema EKG: Paced Rhythm, wide QRS unchanged from previous.  Data Review: CBC:  Lab Results  Component Value Date   WBC 4.5 09/12/2011   HGB 10.9* 09/12/2011   HCT 33.2* 09/12/2011   MCV 92.7 09/12/2011   PLT 240 09/12/2011   BMP:  Lab Results  Component Value Date   GLUCOSE 110* 09/12/2011   CO2 28 09/12/2011   BUN 15 09/12/2011   CREATININE 1.03 09/12/2011   CREATININE 1.27* 09/10/2011   CALCIUM 9.8 09/12/2011   Lab Results  Component Value Date   ALT 7 09/12/2011   AST 17 09/12/2011   ALKPHOS 48 09/12/2011   BILITOT 0.8 09/12/2011   Lab Results  Component Value Date   LABPROT 12.1 04/03/2011    Lab Results  Component Value Date   NA 140 09/12/2011   K 3.6 09/12/2011   CL 103 09/12/2011   CO2 28 09/12/2011    Cardiac markers:  Lab Results  Component Value Date   CKTOTAL 66 09/12/2011   CKMB 3.2 09/12/2011   TROPONINI <0.30 09/12/2011   BNP: 2730  Assessment/Plan: 65 year old female with known COPD and diastolic heart failure who presents to the ER with shortness of breath, wheezing, c/o chest pain.  1) Pulm- Pt clinically appears to be in a COPD exacerbation due to wheezing and improvement with albuterol.  However, CXR and BNP concerning for CHF and fluid overload as cause of dyspnea.  Will admit and treat for COPD exacerbation with PO prednisone, scheduled Albuterol treatments, and Avelox.  40 mg of lasix IV given in the ED, will diurese with Lasix 40 po bid, monitor in's and out's and daily  weights. Repeat CXR in am to monitor fluid status in lungs.  Will order Echocardiogram to eval for heart failure. Oxygen as needed.   2) CV- pt complaining of left side chest pain that she feels like is in her ribs.  She does have significant cardiac history, will cycle cardiac enzymes and repeat EKG in am.  She is mildly hypertensive but did not take bp medications this morning, will re-start home blood pressure and rate-control medications.  Hydralazing PRN for elevated BP and nitroglycerin prn for chest pain. Cont ASA 81 mg po daily.   3) Heme- pt with history of anemia and GI bleed, Hg 10.9 today, stable for pt.  Continue home iron supplementation.   4) FEN/GI- will saline lock IV considering concern for heart failure.  Monitor electrolytes on Lasix.  Heart healthy diet.  Protonix for GI ppx since has history of anemia and heme occult + stools, but work up has been negative for source.    5) DVT PPX- considering GI history, will give SCD's to bilateral LE.  6) Disposition- pending clinical improvement.

## 2011-09-13 ENCOUNTER — Inpatient Hospital Stay (HOSPITAL_COMMUNITY): Payer: PRIVATE HEALTH INSURANCE

## 2011-09-13 DIAGNOSIS — I369 Nonrheumatic tricuspid valve disorder, unspecified: Secondary | ICD-10-CM

## 2011-09-13 DIAGNOSIS — I501 Left ventricular failure: Secondary | ICD-10-CM

## 2011-09-13 DIAGNOSIS — J441 Chronic obstructive pulmonary disease with (acute) exacerbation: Secondary | ICD-10-CM

## 2011-09-13 LAB — CBC
Hemoglobin: 10.5 g/dL — ABNORMAL LOW (ref 12.0–15.0)
MCHC: 33 g/dL (ref 30.0–36.0)
WBC: 4.5 10*3/uL (ref 4.0–10.5)

## 2011-09-13 LAB — BASIC METABOLIC PANEL
BUN: 22 mg/dL (ref 6–23)
Calcium: 10 mg/dL (ref 8.4–10.5)
GFR calc non Af Amer: 45 mL/min — ABNORMAL LOW (ref >60)
Glucose, Bld: 176 mg/dL — ABNORMAL HIGH (ref 70–99)
Sodium: 138 mEq/L (ref 135–145)

## 2011-09-13 LAB — CARDIAC PANEL(CRET KIN+CKTOT+MB+TROPI)
CK, MB: 3.5 ng/mL (ref 0.3–4.0)
Relative Index: INVALID (ref 0.0–2.5)
Total CK: 59 U/L (ref 7–177)
Troponin I: <0.3 ng/mL (ref <0.30)
Troponin I: <0.3 ng/mL (ref <0.30)

## 2011-09-14 LAB — URINE CULTURE

## 2011-09-15 ENCOUNTER — Telehealth: Payer: Self-pay | Admitting: Pharmacist

## 2011-09-15 ENCOUNTER — Other Ambulatory Visit: Payer: Self-pay | Admitting: *Deleted

## 2011-09-15 ENCOUNTER — Telehealth: Payer: Self-pay | Admitting: *Deleted

## 2011-09-15 DIAGNOSIS — K922 Gastrointestinal hemorrhage, unspecified: Secondary | ICD-10-CM

## 2011-09-15 DIAGNOSIS — D649 Anemia, unspecified: Secondary | ICD-10-CM

## 2011-09-15 DIAGNOSIS — I4891 Unspecified atrial fibrillation: Secondary | ICD-10-CM

## 2011-09-15 DIAGNOSIS — E538 Deficiency of other specified B group vitamins: Secondary | ICD-10-CM

## 2011-09-15 MED ORDER — CYANOCOBALAMIN 1000 MCG/ML IJ SOLN: 1000.0000 ug | INTRAMUSCULAR | Status: DC

## 2011-09-15 NOTE — Telephone Encounter (Signed)
Orders in EPIC. Left a message for patient to call me.

## 2011-09-15 NOTE — Telephone Encounter (Signed)
Message copied by Hulan Saas on Mon Sep 15, 2011  9:20 AM ------      Message from: [place], [place]      Created: Fri Sep 12, 2011 10:59 PM       Please call pt with suboptimal results, only part of SB visualized but no obstructing lesion. I have sent a note to Dr Rayann Heman to go ahead and start the "blood thinner". Please repeat CBC,Iron studies first week in Nov 2012  And start B12 1000ug IM monthly IM x 6 months ( B12 low normal at 253 pg )

## 2011-09-15 NOTE — Telephone Encounter (Signed)
Labs in [facility] for 10/20/11. Scheduled Vitamin B 12 on 09/23/11 at 2:00 PM. Patient states she was in the hospital over the weekend for heart problems and SOB. She states she needs to schedule OV with Dr [name] per her d/c orders. Scheduled her on 10/08/11 at 3:00 PM. Patient states she is not sure the capsule passed. She cannot recall seeing it in her stool.

## 2011-09-15 NOTE — Telephone Encounter (Signed)
Spoke with pt.  She is concerned about going back on Coumadin due to keeping her greens consistent.  She mentioned talking to Dr. Rayann Heman about a different medication.  I reviewed his last note and he had mentioned using Xarelto rather than Coumadin.  Will discuss with Dr. Rayann Heman to determine which agent he prefers.

## 2011-09-15 NOTE — Telephone Encounter (Signed)
If capsule not seen in 2 weeks, order KUB, please

## 2011-09-15 NOTE — Discharge Summary (Signed)
  [name], [name]       ACCOUNT NO.:  [id number]  MEDICAL RECORD NO.:  [id number]  LOCATION:  [id number]                         FACILITY:  [facility]  PHYSICIAN:  Lowella Bandy. [name], MD     DATE OF BIRTH:  10-May-1946  DATE OF ADMISSION:  08/26/2011 DATE OF DISCHARGE:  08/26/2011                              DISCHARGE SUMMARY   ADMITTING DIAGNOSIS:  A 65 year old female admitted for observation after endoscopic placement of a given capsule.  The patient has history of pacemaker placement and required monitoring during capsule.  DISCHARGE DIAGNOSIS:  Stable status post capsule endoscopy.  CONSULTATIONS:  None.  PROCEDURES:  Endoscopic placement of Given's capsule.  HISTORY:  [name] is a 65 year old African American female who has a documented history of gastric AVMs on prior endoscopy done in 2011.  She did have ablation done at that time.  She has history of chronic blood loss anemia, is status post colonoscopy with removal of 2 polyps.  She does have history of aortic stenosis, AV block, hypertrophic cardiomyopathy and is status post pacemaker.  She needs to start Coumadin if she can be cleared from a GI standpoint and therefore capsule felt indicated to look for small bowel AVMs.  HOSPITAL COURSE:  The patient had endoscopy with Dr. [name] and endoscopic placement of a Given's capsule.  She was monitored in the short-stay unit on telemetry for 8 hours during the capsule endoscopy and was very stable during this time and allowed discharge to home after the capsule endoscopy was completed.     Amy Esterwood, PA-C   ______________________________ Lowella Bandy. [name], MD    AE/MEDQ  D:  09/03/2011  T:  09/03/2011  Job:  [id number]  Electronically Signed by AMY ESTERWOOD PA-C on 09/10/2011 09:51:52 AM Electronically Signed by Delfin Edis MD on 09/15/2011 04:48:54 PM

## 2011-09-15 NOTE — Telephone Encounter (Signed)
Message copied by Madelaine Etienne on Mon Sep 15, 2011  4:55 PM ------      Message from: Thompson Grayer      Created: Sun Sep 14, 2011  6:59 PM      Regarding: FW: capsule endoscopy       Per Dr Thereasa Solo, we should go ahead and start Ms Raquel Sarna back on coumadin.      Please initiate coumadin and enroll her in the coumadin clinic.            ----- Message -----         From: [name], MD         Sent: 09/12/2011  10:44 PM           To: Coralyn Mark, MD      Subject: capsule endoscopy                                        Jeneen Rinks, We have reviewed  the capsule endoscopy on Mrs [place]. Unfortunately, the study is limited due to suboptimal prep. No major lesions seen. I know You are interested in restarting her anticoagulation. I have reviewed her GI studies over past year. She was heme negative on last exam and her H/H has been holding up. At this point we are not planning any  further GI studies but will continue to recheck her H/H and stool cards. I would encourage You to restart the anticoagulation. We will plan to see her every 3 months to monitor for GI blood loss. Thanx Dora B.

## 2011-09-16 ENCOUNTER — Ambulatory Visit (INDEPENDENT_AMBULATORY_CARE_PROVIDER_SITE_OTHER)
Admission: RE | Admit: 2011-09-16 | Discharge: 2011-09-16 | Disposition: A | Discharge status: Home / Self Care | Payer: PRIVATE HEALTH INSURANCE | Source: Ambulatory Visit | Attending: Internal Medicine | Admitting: Internal Medicine

## 2011-09-16 ENCOUNTER — Ambulatory Visit (INDEPENDENT_AMBULATORY_CARE_PROVIDER_SITE_OTHER): Payer: PRIVATE HEALTH INSURANCE | Admitting: Internal Medicine

## 2011-09-16 DIAGNOSIS — K922 Gastrointestinal hemorrhage, unspecified: Secondary | ICD-10-CM

## 2011-09-16 DIAGNOSIS — E538 Deficiency of other specified B group vitamins: Secondary | ICD-10-CM

## 2011-09-16 MED ORDER — CYANOCOBALAMIN 1000 MCG/ML IJ SOLN
1000.0000 ug | INTRAMUSCULAR | Status: DC
  Administered 2011-09-16: 1000 ug via INTRAMUSCULAR

## 2011-09-16 NOTE — Telephone Encounter (Signed)
Spoke with patient and she will come for the xray today.

## 2011-09-17 ENCOUNTER — Telehealth: Payer: Self-pay | Admitting: *Deleted

## 2011-09-17 NOTE — Telephone Encounter (Signed)
Given the patients concerns, we will start xarelto. Her CrCl based on recent labs is 59.  Start Xarelto 20mg  daily. Repeat CBC and BMET in 6 weeks.

## 2011-09-17 NOTE — Telephone Encounter (Signed)
Message copied by Hulan Saas on Wed Sep 17, 2011  9:16 AM ------      Message from: [name]      Created: Tue Sep 16, 2011 11:04 PM       Please call pt with results of KUB- capsule has passed

## 2011-09-17 NOTE — Telephone Encounter (Signed)
Patient notified of results.

## 2011-09-18 MED ORDER — RIVAROXABAN 20 MG PO TABS: 20.0000 mg | ORAL_TABLET | Freq: Every day | ORAL | Status: DC

## 2011-09-18 NOTE — Telephone Encounter (Signed)
Spoke with patient and she says she will pick the medication up over the weekend  I have called this in to her pharmacy  She is aware to come in in 6 weeks for labs

## 2011-09-22 NOTE — Discharge Summary (Signed)
[name], [name] NO.:  [id number]  MEDICAL RECORD NO.:  [id number]  LOCATION:  [facility]                         FACILITY:  [facility]  PHYSICIAN:  [name] Mayotte, MD        DATE OF BIRTH:  [birth year]  DATE OF ADMISSION:  09/12/2011 DATE OF DISCHARGE:  09/14/2011                              DISCHARGE SUMMARY   PRIMARY CARE PROVIDER:  Karen Kays, MD at [facility].  DISCHARGE DIAGNOSES: 1. Chronic obstructive pulmonary disease exacerbation. 2. Congestive heart failure. 3. Chest pain. 4. Hypertension. 5. Atrial fibrillation. 6. Anemia. 7. Urinary tract infection.  DISCHARGE MEDICATIONS:  New medications on discharge: 1. Keflex 500 mg 4 times daily, take for 5 days. 2. MiraLax 17 g by mouth daily. 3. Prednisone 50 mg by mouth daily with meals, take for 8 days.  Same medications on discharge: 1. Alprazolam 0.5 mg 1 tablet by mouth daily at bedtime. 2. Ascorbic acid 500 mg 1 tablet by mouth daily. 3. Aspirin 81 mg 1 tablet by mouth daily. 4. Carvedilol 6.25 mg 1 tablet by mouth twice daily with meals. 5. Crestor 20 mg 1 tablet by mouth daily. 6. Diltiazem 90 mg 1 tablet by mouth daily. 7. Ferrous sulfate 325 mg 1 tablet by mouth twice daily. 8. Furosemide 40 mg 1 tablet by mouth daily. 9. Multivitamin 1 tablet by mouth daily. 10.Olopatadine 0.2% ophthalmic 1 drop to affected eye daily. 11.Omega-3-acid ethyl esters 1 g 2 capsules by mouth daily. 12.Omeprazole 20 mg 1 capsule by mouth twice daily. 13.ProAir 90 mcg 2 puffs inhaled every 4-6 hours as needed. 14.Senna 8.6 mg 2 tablets by mouth daily. 15.Tylenol Extra Strength 500 mg 1 tablet by mouth every 4 hours as     needed.  CONSULTS:  None.  PROCEDURES: 1. Chest x-ray, stable cardiomegaly with pacer.  Cannot exclude     minimal pulmonary vascular congestion.  Repeat chest x-ray on     September 13, 2011, stable chest radiographic findings.  The     vascular structures are slightly  prominent, but no evidence of     frank pulmonary edema. 2. Echo on September 13, 2011 showing left ventricular wall thickness,     increase in the pattern of severe LVH.  Systolic function was     normal.  Estimated EF was 55%-60%.  Mildly calcified annulus of the     aortic valve.  Left atrium was mildly dilated.  Pulmonary artery     showed systolic pressure, mildly to moderately increased with a PA     pressure of 46 mmHg.  LABORATORY DATA:  CBC on admission;  WBC 4.5, hemoglobin 10.9, hematocrit 33.2, platelets 240.  Complete metabolic panel on admission; sodium 140, potassium 3.6, chloride 103, CO2 of 28, glucose 110, BUN 15, creatinine 1.03.  Total bili 0.8, alk phos 48, AST 17, ALT 7, total protein 7.3, albumin 3.7, calcium 9.8.  ProBNP 2730.  Troponin point-of- care #1 is 0.17, troponin point-of-care #2 is 0.24, repeat troponin 0 at the same time less than 0.30, CK-MB normal at 3.2, total CK 66.  Cardiac panel #2:  And Creatinine 63, CK-MB 3.2, troponin less than 0.3. Cardiac panel #3:  Within normal limits.  Urinalysis showed small leukocytes, 100 protein.  Urine micro showed rare bacteria and few squamous.  Urine culture greater than 100,000 colonies.  BMET on discharge; sodium 138, potassium 3.8, chloride 100, CO2 of 26, glucose 176, BUN 22, creatinine 1.2, calcium 10.  CBC on discharge; WBC 4.5, hemoglobin 10.5, hematocrit 31.8, platelets 228.  BRIEF HOSPITAL COURSE:  A 65 year old female with history of COPD, heart block status post pacemaker, AFib, anemia, hypertension and chronic kidney disease who presented with COPD exacerbation and some chest pain. 1. COPD exacerbation.  The patient presented with shortness of breath     and wheezing with dyspnea on exertion and sputum producing cough.     On admission, she was satting at 96% on room air.  She was started     on IV Solu-Medrol in the emergency department and albuterol nebs.     She was started in the hospital on  Atrovent nebs as well as     steroids and Avelox for COPD prophylaxis.  The patient improved and     by the end of the admission was able to walk without significant     dyspnea.  Her oxygen saturation was 96%-98% on room air on the day     of discharge.  She was discharged on prednisone 50 mg to take for     another 8 days to complete a 10-day course. 2. CHF.  There seemed to be a component of CHF with the dyspnea, given     the increased BNP, the cardiomegaly on chest x-ray, and some mild     crackles on pulmonary exam.  She was started on Lasix 40 mg by     mouth twice a day and started back on her home medications of     carvedilol 6.25 mg twice daily.  An echo was done on her day of     discharge.  Results came back after the patient was discharged and     did show some severe LVH with normal ejection fraction.  The     patient was discharged on her home dose of Lasix.  She diuresed     well in the hospital with a weight of 86.9 compared to her previous     weight of 88.6. 3. Chest pain.  The patient presented with some pain on her left chest     wall underneath her breasts.  Cardiac enzyme panel was done, which     was negative x3.  She did continue having some of the pain     intermittently after being ruled out and she did say that she had     been having it for about a week to week and half.  It appears that     it could have been more of a musculoskeletal etiology.  The patient     was advised to go and see her cardiologist to potentially get an     outpatient stress test.  The patient was also discharged with     sublingual nitroglycerin and aspirin in the event of persistent     chest pain. 4. Hypertension.  The patient's blood pressure ranged between [id number]     systolic on her day of discharged.  On her day of admission, her     blood pressure was 160/100.  She was on Coreg for the hospital     stay.  No new blood pressure medications were started during her     admission  needing  any potential adjustment for her as an     outpatient. 5. Atrial fibrillation.  The patient was stable on that standpoint and     was on her diltiazem and Coreg during the hospitalization. 6. Anemia.  The patient had a chronic anemia from a potential GI     bleed, which was being evaluated by her gastroenterologist where     she had had a recent capsule endoscopy.  She was put back on her     ferrous sulfate as well as on MiraLax and stool softeners to help     with constipation. 7. Urinary tract infection.  The patient's urine culture showed     urinary tract infection.  The patient was started on Keflex to     finish a 5 day course.  DISCHARGE INSTRUCTIONS:  The patient was instructed to return to the clinic or to the hospital if she had any increased shortness of breath, any chest pain, or any fevers.  She was instructed to make followup appointments with her cardiologist, her gastroenterologist, and her primary care provider.  FOLLOWUP APPOINTMENTS: 1. To follow up with Dr. Verdie Drown at [facility] within a week or     two of discharge as well as with Dr. Rayann Heman at [facility]     to make an appointment with them as well. 2. Thinks to follow up on echo.  The patient did not know the resultsof the echo since she was discharged before the results came back. 3. Hypertension.  The patient's blood pressure was still elevated on     discharge and would need to follow up on that standpoint.  The patient was discharged home in stable medical condition.    ______________________________ Liam Graham, MD   ______________________________ [name] Mayotte, MD    SL/MEDQ  D:  09/18/2011  T:  09/19/2011  Job:  [id number]  Electronically Signed by Liam Graham MD on 09/20/2011 04:11:36 AM Electronically Signed by [name] Mayotte MD on 09/22/2011 01:55:36 PM

## 2011-09-25 ENCOUNTER — Telehealth: Payer: Self-pay | Admitting: Internal Medicine

## 2011-09-25 NOTE — Telephone Encounter (Signed)
Pt may have had a TIA, on Weds.  She is concerned and would like to be called.

## 2011-09-25 NOTE — Telephone Encounter (Signed)
Woke up Tues night and her face felt numb and she had a HA  Then Wed morning she woke up limping and her left leg and hip sore She put a heating pad on the area and she is not in pain but now she says she gets weak in her legs when walking

## 2011-09-25 NOTE — Telephone Encounter (Signed)
Daughter picked up samples  [phone] to call for prior aut

## 2011-09-26 ENCOUNTER — Ambulatory Visit (INDEPENDENT_AMBULATORY_CARE_PROVIDER_SITE_OTHER): Payer: PRIVATE HEALTH INSURANCE | Admitting: Family Medicine

## 2011-09-26 ENCOUNTER — Encounter: Payer: Self-pay | Admitting: Family Medicine

## 2011-09-26 VITALS — BP 156/96 | HR 69 | Temp 98.1°F | Wt 189.0 lb

## 2011-09-26 DIAGNOSIS — F411 Generalized anxiety disorder: Secondary | ICD-10-CM

## 2011-09-26 DIAGNOSIS — R252 Cramp and spasm: Secondary | ICD-10-CM

## 2011-09-26 DIAGNOSIS — F17200 Nicotine dependence, unspecified, uncomplicated: Secondary | ICD-10-CM

## 2011-09-26 DIAGNOSIS — I10 Essential (primary) hypertension: Secondary | ICD-10-CM

## 2011-09-26 DIAGNOSIS — F1010 Alcohol abuse, uncomplicated: Secondary | ICD-10-CM

## 2011-09-26 DIAGNOSIS — Z720 Tobacco use: Secondary | ICD-10-CM

## 2011-09-26 LAB — BASIC METABOLIC PANEL
BUN: 25 mg/dL — ABNORMAL HIGH (ref 6–23)
CO2: 34 mEq/L — ABNORMAL HIGH (ref 19–32)
Glucose, Bld: 104 mg/dL — ABNORMAL HIGH (ref 70–99)
Potassium: 4.2 mEq/L (ref 3.5–5.3)
Sodium: 143 mEq/L (ref 135–145)

## 2011-09-26 MED ORDER — PAROXETINE HCL 20 MG PO TABS: 10.0000 mg | ORAL_TABLET | ORAL | Status: DC

## 2011-09-26 NOTE — Assessment & Plan Note (Signed)
Deteriorated after stopping smoking and drinking. Starting Paxil on low-dose. Will follow-up with me in 1-2 weeks.

## 2011-09-26 NOTE — Assessment & Plan Note (Signed)
Quit! Commended her for stopping this and alcohol use but now having worsening anxiety and weight gain. Paxil for anxiety.  Told her 5-10 pound weight gain was expected and that this is preferably to smoking. Discussed exercising when she gets anxious (walking, doing chores around the house) or when she gets cravings to smoke or drink.  Will follow-up closely with her next several weeks.

## 2011-09-26 NOTE — Assessment & Plan Note (Signed)
BP elevated today. Likely due to anxiety following cessation of smoking and drinking. Starting Paxil for anxiety. Will not make any changes to her blood pressure medications at this time.

## 2011-09-26 NOTE — Assessment & Plan Note (Signed)
Used to drink 2-3 shots nightly. I just found out about this today when she told me she stopped smoking and drinking. Stopped about a month ago.  She is not going through active alcohol withdrawal although she is more anxious now that she is not doing either.  Will follow her closely during next several months.

## 2011-09-26 NOTE — Patient Instructions (Addendum)
Try the Paxil. It may make you more anxious at first, but this will improve with time.  Follow-up with me in 1 week.  I am so very proud of you for stopping smoking and drinking.   Continue to take your blood pressure medications.  If you are having severe headaches or get chest pain, call the clinic and let me know or to go the Emergency Department.   For your cramps, we will check your bloodwork today. If your lab results are normal, I will send you a letter with the results. If abnormal, someone at the clinic will get in touch with you.    Stroke (Cerebrovascular Accident) A stroke is the sudden death of brain tissue. It is a medical emergency. A stroke can cause permanent loss of brain function. If the symptoms of a stroke end without complications in 24 hours, it is diagnosed as a transient ischemic attack (TIA). If the symptoms are not resolved within 24 hours, it is defined as a stroke. CAUSES A stroke is caused by a decrease of oxygen supply to an area of your brain. It is usually the result of a small blood clot or the arteries hardening. A stroke can also be caused by blocked or damaged carotid arteries. Bleeding in the brain can cause, or accompany, a stroke. RISK FACTORS  High blood pressure (hypertension).   High cholesterol.     Diabetes.    Heart disease.     The buildup of fatty deposits in the blood vessels (peripheral artery disease or atherosclerosis).     An abnormal heart rhythm (atrial fibrillation).     Obesity.    Smoking.    Taking oral contraceptives (especially in combination with smoking).     Physical inactivity.     A diet high in fats, salt (sodium), and calories.   Alcohol use.     Use of illegal drugs (especially cocaine and methamphetamine).     Being a female.     Being an Sales promotion account executive.     Age over 48.     Family history of stroke.     Previous history of blood clots, a "warning stroke" (transient ischemic attack, TIA), or heart  attack.     Sickle cell disease.     SYMPTOMS These symptoms usually develop suddenly (or may be newly present upon awakening from sleep):  Sudden weakness or numbness of the face, arm, or leg, especially on one side of the body.   Sudden confusion.     Trouble speaking (aphasia) or understanding.     Sudden trouble seeing in one or both eyes.     Sudden trouble walking.   Dizziness.    Loss of balance or coordination.     Sudden severe headache with no known cause.     DIAGNOSIS Your caregiver can often determine the presence or absence of a stroke based on your symptoms, history, and examination. A computerized X-ray scan (CT or CAT scan) of the brain is usually performed to confirm the stroke, to look for causes, and to determine the severity. Other tests may be done to find the cause of the stroke. These tests may include:  An EKG and heart monitoring.   An ultrasound evaluation of the heart (echocardiogram).   An ultrasound evaluation of your carotid arteries.   A computerized magnetic scan (MRI).   A scan of the brain circulation.   Blood oxygen level monitoring.   Blood tests.  PREVENTION The risk of a  stroke can be decreased by appropriately treating high blood pressure, high cholesterol, diabetes, heart disease, and obesity and by quitting smoking, limiting alcohol, and staying physically active. TREATMENT TIME IS OF THE ESSENCE! Medicines to dissolve a blood clot can only be used within 4 1/2 hours of the onset of symptoms. After the 4 1/2 hour window has passed, treatment may include rest, oxygen, intravenous (IV) fluids, and medicines to thin the blood (to prevent another stroke). Treatment of stroke depends on the duration, severity, and cause of your symptoms. Medicines and diet may be used to address diabetes, high blood pressure, and other risk factors. Physical, speech, and occupational therapists will assess you and work to improve any functions impaired by  the stroke. Measures will be taken to prevent short-term and long-term complications, including infection from breathing foreign material into the lungs (aspiration pneumonia), blood clots in the legs, bedsores, and falls. Rarely, surgery may be needed to remove large blood clots or to open up blocked arteries. HOME CARE INSTRUCTIONS  Medicines: Aspirin and blood thinners may be used to prevent another stroke. Blood thinners need to be used exactly as instructed. Medicines may also be used to control risk factors for a stroke. Be sure you understand all your medicine instructions.   Diet: Certain diets may be prescribed to address high blood pressure, high cholesterol, diabetes, or obesity.   A low salt (sodium), low saturated fat, low trans fat, low cholesterol diet is recommended to manage high blood pressure.   A low saturated fat, low trans fat, low cholesterol, and high fiber diet may control cholesterol levels.   A controlled carbohydrate, controlled sugar diet is recommended to manage diabetes.   A reduced calorie, low sodium, low saturated fat, low trans fat, low cholesterol diet is recommended to manage obesity.  A diet that includes 5 or more servings of fruits and vegetables a day may reduce the risk of stroke. Foods   may need to be a special consistency (soft or pureed), or small bites may need to be taken in order to avoid   aspirating or choking.  Maintain a healthy weight.   Stay physically active. It is recommended that you get at least 30 minutes of activity on most or all days.   Do not smoke.   Limit alcohol use. Moderate alcohol use is considered to be:   No more than 2 drinks per day for men.   No more than 1 drink per day for nonpregnant women.   Stop drug abuse.   Home safety: A safe home environment is important to reduce the risk of falls. Your caregiver may arrange for specialists to evaluate your home. Having grab bars in the bedroom and bathroom is often  important. Your caregiver may arrange for special equipment to be used at home, such as raised toilets and a seat for the shower.   Physical, occupational, and speech therapy: Ongoing therapy may be needed to maximize your recovery after a stroke. If you have been advised to use a walker or a cane, use it at all times. Be sure to keep your therapy appointments.   Follow all instructions for follow-up with your caregiver. This is VERY important. This includes any referrals, physical therapy, rehabilitation, and laboratory tests. Proper treatment also prevents another stroke from occurring.  SEEK IMMEDIATE MEDICAL CARE IF:  You have sudden weakness or numbness of the face, arm, or leg, especially on one side of the body.   You have sudden confusion.  You have trouble speaking (aphasia) or understanding.   You have sudden trouble seeing in one or both eyes.   You have sudden trouble walking.   You have dizziness.   You have a loss of balance or coordination.   You have a sudden severe headache with no known cause.   You have an oral temperature above , not controlled by medicine.   You are coughing or have difficulty breathing.   You have new chest pain, angina, or an irregular heartbeat.  ANY OF THESE SYMPTOMS MAY REPRESENT A SERIOUS PROBLEM THAT IS AN EMERGENCY. Do not wait to see if the symptoms will go away. Get medical help right away. Call  (911 in [place] DO NOT drive yourself to the hospital. Document Released: 12/01/2005 Document Re-Released: 05/21/2010 [facility] Patient Information 2011 [place].

## 2011-09-26 NOTE — Telephone Encounter (Signed)
Medication Xarelto 20mg  daily approved through 09/25/12  Ref [id number]

## 2011-09-26 NOTE — Progress Notes (Signed)
  Subjective:    Patient ID: [name], female    DOB: 27-Nov-1946, 65 y.o.   MRN: [id number]  HPI This is a 65 YO AA F w h/o anxiety, S3CKD, HTN, AF/chronic diastolic HF, tobacco/alcohol abuse, OSA (?), HLD Here for follow-up of following issues:  1. Tobacco, alcohol abuse Stopped smoking and drinking (2-3 shots nightly) a month ago. Now feeling more anxious but is feeling good about stopping both.  Increased appetite.  2. Anxiety Taking Xanax 1-2 tablets daily. Feels like she cannot control thoughts in her head.  ROS: denies depression, fevers, diarrhea, sweating, heart palpatations, hallucinations  3. HTN Compliant with BP medications ROS: denies chest pain, headache  Review of Systems Per HPI    Objective:   Physical Exam Gen: NAD, non-diaphoretic Psych: appears more anxious than usual but appropriate to questions, engaged, not depressed-appearing, though and judgement normal CV: RRR; no JVD Pulm: CTAB, no rales Ext: no edema    Assessment & Plan:

## 2011-10-01 ENCOUNTER — Other Ambulatory Visit: Payer: Self-pay | Admitting: Internal Medicine

## 2011-10-01 MED ORDER — RIVAROXABAN 20 MG PO TABS: 20.0000 mg | ORAL_TABLET | Freq: Every day | ORAL | Status: DC

## 2011-10-03 ENCOUNTER — Telehealth: Payer: Self-pay | Admitting: Internal Medicine

## 2011-10-03 NOTE — Telephone Encounter (Signed)
Returned call to patient and she states she has felt dizziness and she is going to go and have her BP checked to see if its still running high  She has a follow with PCP on 11/26  And Dr Rayann Heman 12/18 Dr [name] 10/08/11 She is thinking that the Xarelto is making her feel this way I told her that I did not think dizziness is a SE but that she really needed to get into see her PCP to have her BP evaluated

## 2011-10-03 NOTE — Telephone Encounter (Signed)
Pt has a question regarding her medication and the way she is feeling.  Please call her back

## 2011-10-05 ENCOUNTER — Inpatient Hospital Stay (HOSPITAL_COMMUNITY)
Admission: EM | Admit: 2011-10-05 | Discharge: 2011-10-08 | DRG: 378 | Disposition: A | Discharge status: Home Health | Payer: PRIVATE HEALTH INSURANCE | Attending: Family Medicine | Admitting: Family Medicine

## 2011-10-05 ENCOUNTER — Emergency Department (HOSPITAL_COMMUNITY): Payer: PRIVATE HEALTH INSURANCE

## 2011-10-05 ENCOUNTER — Encounter: Payer: Self-pay | Admitting: Family Medicine

## 2011-10-05 DIAGNOSIS — Z950 Presence of cardiac pacemaker: Secondary | ICD-10-CM

## 2011-10-05 DIAGNOSIS — D500 Iron deficiency anemia secondary to blood loss (chronic): Secondary | ICD-10-CM | POA: Diagnosis present

## 2011-10-05 DIAGNOSIS — D62 Acute posthemorrhagic anemia: Secondary | ICD-10-CM | POA: Diagnosis present

## 2011-10-05 DIAGNOSIS — Z79899 Other long term (current) drug therapy: Secondary | ICD-10-CM

## 2011-10-05 DIAGNOSIS — Z7901 Long term (current) use of anticoagulants: Secondary | ICD-10-CM

## 2011-10-05 DIAGNOSIS — G4730 Sleep apnea, unspecified: Secondary | ICD-10-CM | POA: Diagnosis present

## 2011-10-05 DIAGNOSIS — K7689 Other specified diseases of liver: Secondary | ICD-10-CM | POA: Diagnosis present

## 2011-10-05 DIAGNOSIS — Z7982 Long term (current) use of aspirin: Secondary | ICD-10-CM

## 2011-10-05 DIAGNOSIS — I5032 Chronic diastolic (congestive) heart failure: Secondary | ICD-10-CM | POA: Diagnosis present

## 2011-10-05 DIAGNOSIS — Z87891 Personal history of nicotine dependence: Secondary | ICD-10-CM

## 2011-10-05 DIAGNOSIS — K922 Gastrointestinal hemorrhage, unspecified: Principal | ICD-10-CM | POA: Diagnosis present

## 2011-10-05 DIAGNOSIS — I4891 Unspecified atrial fibrillation: Secondary | ICD-10-CM | POA: Diagnosis present

## 2011-10-05 DIAGNOSIS — Z8673 Personal history of transient ischemic attack (TIA), and cerebral infarction without residual deficits: Secondary | ICD-10-CM

## 2011-10-05 DIAGNOSIS — M129 Arthropathy, unspecified: Secondary | ICD-10-CM | POA: Diagnosis present

## 2011-10-05 DIAGNOSIS — I129 Hypertensive chronic kidney disease with stage 1 through stage 4 chronic kidney disease, or unspecified chronic kidney disease: Secondary | ICD-10-CM | POA: Diagnosis present

## 2011-10-05 DIAGNOSIS — F341 Dysthymic disorder: Secondary | ICD-10-CM | POA: Diagnosis present

## 2011-10-05 DIAGNOSIS — J45909 Unspecified asthma, uncomplicated: Secondary | ICD-10-CM | POA: Diagnosis present

## 2011-10-05 DIAGNOSIS — Z860100 Personal history of colon polyps, unspecified: Secondary | ICD-10-CM

## 2011-10-05 DIAGNOSIS — E785 Hyperlipidemia, unspecified: Secondary | ICD-10-CM | POA: Diagnosis present

## 2011-10-05 DIAGNOSIS — N183 Chronic kidney disease, stage 3 unspecified: Secondary | ICD-10-CM | POA: Diagnosis present

## 2011-10-05 LAB — DIFFERENTIAL
Basophils Relative: 0 % (ref 0–1)
Eosinophils Absolute: 0.1 10*3/uL (ref 0.0–0.7)
Lymphs Abs: 1.1 10*3/uL (ref 0.7–4.0)
Monocytes Relative: 8 % (ref 3–12)
Neutro Abs: 2.6 10*3/uL (ref 1.7–7.7)
Neutrophils Relative %: 63 % (ref 43–77)

## 2011-10-05 LAB — COMPREHENSIVE METABOLIC PANEL
ALT: 8 U/L (ref 0–35)
AST: 14 U/L (ref 0–37)
Albumin: 3.2 g/dL — ABNORMAL LOW (ref 3.5–5.2)
Alkaline Phosphatase: 38 U/L — ABNORMAL LOW (ref 39–117)
Calcium: 9.4 mg/dL (ref 8.4–10.5)
Glucose, Bld: 116 mg/dL — ABNORMAL HIGH (ref 70–99)
Potassium: 3.8 mEq/L (ref 3.5–5.1)
Sodium: 138 mEq/L (ref 135–145)
Total Protein: 6 g/dL (ref 6.0–8.3)

## 2011-10-05 LAB — CBC
Hemoglobin: 6.2 g/dL — CL (ref 12.0–15.0)
MCH: 30.8 pg (ref 26.0–34.0)
MCV: 91.5 fL (ref 78.0–100.0)
Platelets: 174 10*3/uL (ref 150–400)
RBC: 2.01 MIL/uL — ABNORMAL LOW (ref 3.87–5.11)
WBC: 4.2 10*3/uL (ref 4.0–10.5)

## 2011-10-05 LAB — CARDIAC PANEL(CRET KIN+CKTOT+MB+TROPI)
CK, MB: 3.6 ng/mL (ref 0.3–4.0)
Relative Index: INVALID (ref 0.0–2.5)
Total CK: 38 U/L (ref 7–177)
Troponin I: <0.3 ng/mL (ref <0.30)

## 2011-10-05 LAB — APTT: aPTT: 35 seconds (ref 24–37)

## 2011-10-05 LAB — PROTIME-INR: Prothrombin Time: 22.9 seconds — ABNORMAL HIGH (ref 11.6–15.2)

## 2011-10-05 LAB — CROSSMATCH

## 2011-10-05 NOTE — H&P (Signed)
[name] is an 65 y.o. female.    PCP: Verdie Drown  Chief Complaint: Dizzyness, chest pain, dyspnea.  HPI: 65 yo female with history of atrial fibrillation on anticoagulation, gastric AVM, and anemia who presents with dizziness, exertional dyspnea, and intermittent chest pain beginning on Wednesday of this week. She also complains of lightheadedness, palpitations, and headache today. She underwent capsule endoscopy 6 weeks ago due to a presumed GI blood loss anemia that was inconclusive but negative. Having intermittent black, tarry stools for months. Approximately 2 weeks ago patient was evaluated for her some left-sided weakness and facial numbness which had resolved, but thought to be TIA. Shew was previously on Coumadin for history of atrial fibrillation but after negative endoscopy and concern for TIA was restarted anticoagulation with Xarelto Tuesday of this week. In the emergency department today, the patient is found to be hemodynamically stable and free of chest pain; however her hemoglobin is 6.2 decreased from previous values in the 10 range. The patient continues to endorse intermittent black tarry stools with last bowel movement yesterday. She also endorses small amount bright red blood per rectum after straining with bowel movement 2 days ago. Endorses abdominal pain yesterday but eating normally and is hungry today.   ROS: endorses pruritic rash on chest this week. Denies current one-sided weakness, numbness, dysarthria, dysphagia, hematemesis, edema, abnormal bruising or other bleeding, decreased appetite, weight changes.  Past Medical History  Diagnosis Date  . Angioedema     2/2 ACE  . Tobacco dependence     Quit January 2012; tried 1 cigarette March 2012, did not enjoy,  resolved to stay quit.   [name] RLS (restless legs syndrome)     Dx 06/2007  . Anemia   . Arteriovenous malformation of stomach   . Permanent atrial fibrillation   . Renal failure     baseline creatinine  1.6  . Hyperlipidemia   . Depression   . Diastolic heart failure   . Arthritis   . Sleep apnea   . Asthma   . Hx of colonic polyp 08/13/10    adenomatous  . Fatty liver 07/26/10  . Panic attacks   . Hypertension   . Complete heart block     s/p PPM 1998  . Hypertrophic cardiomyopathy     dx by Dr [name] 2009  . Hx of colonoscopy   . GI bleed   . Panic attack   . DDD (degenerative disc disease)     Past Surgical History  Procedure Date  . Tubal ligation 04/01/2000  . Pacemaker insertion [place], most recent gen change by [name] 4/12  . Cardiac catheterization     Family History  Problem Relation Age of Onset  . Hypertension Mother   . Stroke Mother   . Hypertension Father   . Stroke Father   . Hypertension Sister   . Hypertension Brother   . Colon cancer Brother   . Diabetes Sister   . Diabetes Brother   . Cancer Brother   . Heart attack Brother   . Heart attack Father   . Heart disease Mother   . Heart disease Sister   . Alcohol abuse Brother   . Alcohol abuse Father    Social History:  reports that she has been smoking Cigarettes.  She has been smoking about .1 packs per day. She has never used smokeless tobacco. She reports that she drinks about 1.5 ounces of alcohol per week. She reports that she  does not use illicit drugs.  Allergies:  Allergies  Allergen Reactions  . Ace Inhibitors     REACTION: angioedema    Medications Prior to Admission  Medication Dose Route Frequency Provider Last Rate Last Dose  . cyanocobalamin ((VITAMIN B-12)) injection 1,000 mcg  1,000 mcg Intramuscular Q30 days [name], MD   1,000 mcg at 09/16/11 1341   Medications Prior to Admission  Medication Sig Dispense Refill  . acetaminophen (TYLENOL) 325 MG tablet Take 650 mg by mouth every 6 (six) hours as needed.        [name] albuterol (PROAIR HFA) 108 (90 BASE) MCG/ACT inhaler two puffs every 4-6 hours as needed       . ALPRAZolam (XANAX) 0.5 MG tablet Take 1 tablet (0.5  mg total) by mouth at bedtime as needed. For anxiety  30 tablet  0  . aspirin 81 MG EC tablet Take 81 mg by mouth daily.        . carvedilol (COREG) 6.25 MG tablet Take 1 tablet (6.25 mg total) by mouth 2 (two) times daily with a meal.  60 tablet  6  . diltiazem (CARDIZEM) 90 MG tablet Take 1 tablet (90 mg total) by mouth daily.  30 tablet  2  . ferrous sulfate 325 (65 FE) MG tablet Take 1 tablet (325 mg total) by mouth 2 (two) times daily with a meal.  60 tablet  3  . furosemide (LASIX) 40 MG tablet Take 1 tablet (40 mg total) by mouth daily.  30 tablet  2  . Multiple Vitamins-Minerals (CENTRUM) tablet Take 1 tablet by mouth daily.        . Olopatadine HCl (PATADAY) 0.2 % SOLN Apply 1 drop to eye daily.  1 Bottle  5  . Omega-3 Fatty Acids (FISH OIL PO) Take by mouth. Take 2 capsules by mouth daily       . omeprazole (PRILOSEC) 20 MG capsule Take 1 capsule (20 mg total) by mouth 2 (two) times daily.  60 capsule  3  . PARoxetine (PAXIL) 20 MG tablet Take 0.5 tablets (10 mg total) by mouth every morning.  30 tablet  0  . Probiotic Product (SUPER PROBIOTIC PO) Take 1 capsule by mouth daily.        . Rivaroxaban (XARELTO) 20 MG TABS Take 20 mg by mouth daily.  30 tablet  6  . rosuvastatin (CRESTOR) 20 MG tablet Take 1 tablet (20 mg total) by mouth daily.  30 tablet  3  . senna (SENOKOT) 8.6 MG tablet Take 1 tablet by mouth daily.        . vitamin C (ASCORBIC ACID) 500 MG tablet Take 500 mg by mouth daily.          Results for orders placed during the hospital encounter of 10/05/11 (from the past 48 hour(s))  PRO B NATRIURETIC PEPTIDE     Status: Abnormal   Collection Time   10/05/11 11:56 AM      Component Value Range Comment   BNP, POC 912.2 (*) 0 - 125 (pg/mL)   DIFFERENTIAL     Status: Normal   Collection Time   10/05/11 11:57 AM      Component Value Range Comment   Neutrophils Relative 63  43 - 77 (%)    Neutro Abs 2.6  1.7 - 7.7 (K/uL)    Lymphocytes Relative 27  12 - 46 (%)    Lymphs  Abs 1.1  0.7 - 4.0 (K/uL)    Monocytes  Relative 8  3 - 12 (%)    Monocytes Absolute 0.3  0.1 - 1.0 (K/uL)    Eosinophils Relative 2  0 - 5 (%)    Eosinophils Absolute 0.1  0.0 - 0.7 (K/uL)    Basophils Relative 0  0 - 1 (%)    Basophils Absolute 0.0  0.0 - 0.1 (K/uL)   CBC     Status: Abnormal   Collection Time   10/05/11 11:57 AM      Component Value Range Comment   WBC 4.2  4.0 - 10.5 (K/uL)    RBC 2.01 (*) 3.87 - 5.11 (MIL/uL)    Hemoglobin 6.2 (*) 12.0 - 15.0 (g/dL)    HCT 18.4 (*) 36.0 - 46.0 (%)    MCV 91.5  78.0 - 100.0 (fL)    MCH 30.8  26.0 - 34.0 (pg)    MCHC 33.7  30.0 - 36.0 (g/dL)    RDW 13.7  11.5 - 15.5 (%)    Platelets 174  150 - 400 (K/uL)   PROTIME-INR     Status: Abnormal   Collection Time   10/05/11 11:57 AM      Component Value Range Comment   Prothrombin Time 22.9 (*) 11.6 - 15.2 (seconds)    INR 1.99 (*) 0.00 - 1.49    APTT     Status: Normal   Collection Time   10/05/11 11:57 AM      Component Value Range Comment   aPTT 35  24 - 37 (seconds)   COMPREHENSIVE METABOLIC PANEL     Status: Abnormal   Collection Time   10/05/11 11:57 AM      Component Value Range Comment   Sodium 138  135 - 145 (mEq/L)    Potassium 3.8  3.5 - 5.1 (mEq/L)    Chloride 104  96 - 112 (mEq/L)    CO2 25  19 - 32 (mEq/L)    Glucose, Bld 116 (*) 70 - 99 (mg/dL)    BUN 45 (*) 6 - 23 (mg/dL)    Creatinine, Ser 1.52 (*) 0.50 - 1.10 (mg/dL)    Calcium 9.4  8.4 - 10.5 (mg/dL)    Total Protein 6.0  6.0 - 8.3 (g/dL)    Albumin 3.2 (*) 3.5 - 5.2 (g/dL)    AST 14  0 - 37 (U/L)    ALT 8  0 - 35 (U/L)    Alkaline Phosphatase 38 (*) 39 - 117 (U/L)    Total Bilirubin 0.2 (*) 0.3 - 1.2 (mg/dL)    GFR calc non Af Amer 34 (*) >90 (mL/min)    GFR calc Af Amer 40 (*) >90 (mL/min)   POCT I-STAT TROPONIN I     Status: Abnormal   Collection Time   10/05/11 12:09 PM      Component Value Range Comment   Troponin i, poc 0.14 (*) 0.00 - 0.08 (ng/mL)    Comment NOTIFIED PHYSICIAN      Comment  3            CROSSMATCH     Status: Normal (Preliminary result)   Collection Time   10/05/11 12:37 PM      Component Value Range Comment   ABO/RH(D) PENDING      Antibody Screen PENDING      Sample Expiration 10/08/2011      Dg Chest 2 View  10/05/2011  *RADIOLOGY REPORT*  Clinical Data: Dizziness, shortness of breath, and headache. History of recent pneumonia.  CHEST -  2 VIEW  Comparison: Chest radiograph 09/13/2011  Findings: There is mild cardiomegaly.  Stable mediastinal and hilar contours.  Right-sided cardiac pacemaker is present with leads terminating in the expected location of the right atrium and right ventricle, and stable position.  Pulmonary vascularity is normal. The lungs are clear.  No focal airspace opacity, effusion, or edema.  Negative for pneumothorax.  Bony thorax is unremarkable.  IMPRESSION: Stable mild cardiomegaly with cardiac pacer.  No acute cardiopulmonary disease.  Original Report Authenticated By: Curlene Dolphin, M.D.   Ct Head Wo Contrast  10/05/2011  *RADIOLOGY REPORT*  Clinical Data: Dizziness, shortness of breath and headache.  CT HEAD WITHOUT CONTRAST  Technique:  Contiguous axial images were obtained from the base of the skull through the vertex without contrast.  Comparison: None.  Findings: Slight cerebral volume loss.  The ventricles are normal in size.  Negative for hemorrhage, mass effect, mass lesion, or evidence of acute cortically based infarction.  The skull is intact.  Note is made of a sclerotic 7 mm lesion in the clivus of the skull.  There is a tiny left mastoid effusion. The visualized paranasal sinuses and right mastoid air cells are clear.  Scalp is unremarkable.  IMPRESSION: 1.  No acute intracranial abnormality. 2.  7 mm sclerotic area in the clivus.  In the absence of any known history of malignancy, this is statistically likely benign.  If the patient has a known history of malignancy, then nuclear medicine whole body bone scan could be considered  for further evaluation.  Original Report Authenticated By: Curlene Dolphin, M.D.    ROS See HPI.  There were no vitals taken for this visit. Physical Exam  Vitals reviewed. Constitutional: She is oriented to person, place, and time. She appears well-developed and well-nourished.       T. 98.6, BP130/75-118/71, P 67, R 18, 100% on RA  HENT:  Head: Normocephalic and atraumatic.  Mouth/Throat: Oropharynx is clear and moist.  Eyes: EOM are normal. Pupils are equal, round, and reactive to light.  Neck: Neck supple.  Cardiovascular: Normal rate, regular rhythm and normal heart sounds.  Exam reveals no gallop.   No murmur heard. Respiratory: Effort normal and breath sounds normal. No respiratory distress. She has no wheezes. She has no rales.  GI: Soft. Bowel sounds are normal. She exhibits no distension. There is no tenderness. There is no rebound and no guarding.  Musculoskeletal: She exhibits no edema and no tenderness.  Neurological: She is alert and oriented to person, place, and time. No cranial nerve deficit. Coordination normal.       5/5 Bilateral LE and UE strength. Fluent speech.   Skin:       Few scattered papules on chest. No bleeding or urticaria.   Psychiatric: She has a normal mood and affect.     Assessment/Plan  65 yo female with history of atrial fibrillation on anticoagulation and gastric AVMs who presents with symptomatic blood loss anemia and chest pain.  1. Blood loss anemia. Patient is hemodynamically stable and no signs of hemorrhage currently, however she is clearly symptomatic from anemia. Her last known HGB was 10.5 ~3 weeks ago. This is likely an acute on chronic problem as she had HGB in the 5 range back in February this year secondary to AVM. Clearly this was exacerbated by restarting her anticoagulation. Will type and cross and transfuse 2 units packed red cells in the setting of associated chest pain. I spoke with Dr. Maurene Capes of [facility]  who has performed a  multiple endoscopies on this patient. Patient has history gastric AVM and no further intervention is warranted at this time. I will treat symptomatically with transfusion tonight and monitor for signs of increased bleeding in the step down unit. Will start protonix 40 mg IV twice a day. Hold the xarelto, aspirin, and avoid chemoprophylaxis for VTE. INR is mildly elevated which I believe is due to her thrombin inhibitor. WIll give vitamin K 10mg  PO x1 and recheck INR in the am. Patient may also benefit from IV iron therapy after transfusion.   2. Chest pain. None actively and EKG unrevealing. Will cycle cardiac enzymes to assess for cardiac strain/ischemia and repeat EKG in am. Patient would not be a candidate for heparin or aspirin products likely. Will monitor in SDU.  3. Atrial fibrillation. Patient is high risk for stroke however has failed anticoagulation secondary to GI bleeding. Will hold anticoagulation for now and plan to notify her primary cardiologist (Dr. Rayann Heman). Will continue CCB and BB tonight as she is hemodynamically stable.   4. Diastolic CHF. No signs of fluid overload currently. Will monitor closely following transfusion and restart home lasix 40mg  if indicated. Borderline BNP noted, but no fluid on CXR.   5. Anxiety/depression. Patient takes prn xanax for anxiety and recently started low dose paxil as an outpatient. I believe her current symptoms to be more related to anemia, but possibly related to this. Will continue current dose to avoid withdrawal syndrome and follow up as outpatient likely.  6. HTN. Controlled currently. Cont home meds.  7. FEN/GI. Since patient has chronic and acute bleeding and GI not planning any procedures, will allow clears and advance as tolerated since patient is hungry and she has been eating normally at home. Will KVO PIV. F/u elecrolytes in am.  8. PPX. SCDs and protonix 40mg  BID.  9. Dispo. To SDU for close monitoring. Patient and daughter desire  full code status.   Gerarda Conklin 10/05/2011, 1:20 PM

## 2011-10-06 DIAGNOSIS — K922 Gastrointestinal hemorrhage, unspecified: Secondary | ICD-10-CM

## 2011-10-06 DIAGNOSIS — D62 Acute posthemorrhagic anemia: Secondary | ICD-10-CM

## 2011-10-06 DIAGNOSIS — I4891 Unspecified atrial fibrillation: Secondary | ICD-10-CM

## 2011-10-06 DIAGNOSIS — K31819 Angiodysplasia of stomach and duodenum without bleeding: Secondary | ICD-10-CM

## 2011-10-06 LAB — CARDIAC PANEL(CRET KIN+CKTOT+MB+TROPI): CK, MB: 3.4 ng/mL (ref 0.3–4.0)

## 2011-10-06 LAB — COMPREHENSIVE METABOLIC PANEL
AST: 15 U/L (ref 0–37)
CO2: 26 mEq/L (ref 19–32)
Calcium: 9.3 mg/dL (ref 8.4–10.5)
Creatinine, Ser: 1.3 mg/dL — ABNORMAL HIGH (ref 0.50–1.10)
GFR calc Af Amer: 48 mL/min — ABNORMAL LOW (ref >90)
GFR calc non Af Amer: 41 mL/min — ABNORMAL LOW (ref >90)
Glucose, Bld: 120 mg/dL — ABNORMAL HIGH (ref 70–99)
Total Protein: 5.9 g/dL — ABNORMAL LOW (ref 6.0–8.3)

## 2011-10-06 LAB — CROSSMATCH: Unit division: 0

## 2011-10-06 LAB — PROTIME-INR
INR: 1.23 (ref 0.00–1.49)
Prothrombin Time: 15.8 seconds — ABNORMAL HIGH (ref 11.6–15.2)

## 2011-10-06 LAB — CBC
Hemoglobin: 8.4 g/dL — ABNORMAL LOW (ref 12.0–15.0)
MCH: 30.8 pg (ref 26.0–34.0)
MCHC: 34.4 g/dL (ref 30.0–36.0)
MCV: 89.4 fL (ref 78.0–100.0)
Platelets: 186 10*3/uL (ref 150–400)
RBC: 2.73 MIL/uL — ABNORMAL LOW (ref 3.87–5.11)
RBC: 2.79 MIL/uL — ABNORMAL LOW (ref 3.87–5.11)

## 2011-10-06 LAB — APTT: aPTT: 30 seconds (ref 24–37)

## 2011-10-07 DIAGNOSIS — K922 Gastrointestinal hemorrhage, unspecified: Secondary | ICD-10-CM

## 2011-10-07 DIAGNOSIS — K31819 Angiodysplasia of stomach and duodenum without bleeding: Secondary | ICD-10-CM

## 2011-10-07 LAB — BASIC METABOLIC PANEL
CO2: 27 mEq/L (ref 19–32)
Calcium: 9.5 mg/dL (ref 8.4–10.5)
Chloride: 105 mEq/L (ref 96–112)
Potassium: 4 mEq/L (ref 3.5–5.1)
Sodium: 140 mEq/L (ref 135–145)

## 2011-10-07 LAB — HEMOGLOBIN A1C
Hgb A1c MFr Bld: 5.8 % — ABNORMAL HIGH (ref <5.7)
Mean Plasma Glucose: 120 mg/dL — ABNORMAL HIGH (ref <117)

## 2011-10-07 LAB — CBC
HCT: 23.3 % — ABNORMAL LOW (ref 36.0–46.0)
MCV: 90.3 fL (ref 78.0–100.0)
RBC: 2.58 MIL/uL — ABNORMAL LOW (ref 3.87–5.11)
WBC: 4.5 10*3/uL (ref 4.0–10.5)

## 2011-10-07 LAB — RETICULOCYTES: Retic Count, Absolute: 178 10*3/uL (ref 19.0–186.0)

## 2011-10-07 LAB — IRON AND TIBC: UIBC: 186 ug/dL (ref 125–400)

## 2011-10-07 LAB — VITAMIN B12: Vitamin B-12: 472 pg/mL (ref 211–911)

## 2011-10-07 LAB — FOLATE: Folate: 11.5 ng/mL

## 2011-10-07 NOTE — Telephone Encounter (Signed)
Called the patient back to see how she is doing per family member  She is at [facility] [employer] in on Sunday --she was loosing blood and they are trying to figure out where it is coming from

## 2011-10-08 ENCOUNTER — Ambulatory Visit: Payer: PRIVATE HEALTH INSURANCE | Admitting: Internal Medicine

## 2011-10-08 LAB — CBC
HCT: 22.9 % — ABNORMAL LOW (ref 36.0–46.0)
Hemoglobin: 7.7 g/dL — ABNORMAL LOW (ref 12.0–15.0)
MCV: 92 fL (ref 78.0–100.0)
RDW: 15.9 % — ABNORMAL HIGH (ref 11.5–15.5)
WBC: 3.7 10*3/uL — ABNORMAL LOW (ref 4.0–10.5)

## 2011-10-08 NOTE — Discharge Summary (Signed)
Physician Discharge Summary  Patient ID: [name] MRN: [id number] DOB/AGE: 05/12/1946 65 y.o.  Admit date: 10/05/2011 Discharge date: 10/08/2011  PCP: Karen Kays, MD of [facility]  Gastroenterologist: Delfin Edis, MD  Cardiologist: Thompson Grayer, MD   Consultants: 1. [facility]     Discharge Diagnosis: Primary 1. GI bleed on anticoagulation with history of gastric AVMs.  2. History of TIA 2 weeks ago started on xarelto Secondary 1. History of tobacco use.   2. History of GI bleed   3. AV malformation of stomach.   4. Atrial fibrillation.   5. Chronic kidney disease with baseline creatinine 1.6.   6. Hyperlipidemia.   7. Depression.   8. Diastolic heart failure, EF 55-60%.   9. Arthritis.   10.Sleep apnea.   11.Asthma.   12.History of colonic polyp.   13.Fatty liver.   14.Hypertension.   15. History of panic attacks.     Hospital Course 65 year old female with a history of GI bleed with history of gastric AVM, A. Fib with pacemaker, history of recent TIA started on xarelto, and chronic anemia likely due to GI source who presented with dizziness, SOB, and CP and was found to have a hgb of 6.2 on admission now s/p 2 units prbc.  Problem List 1. GI bleed/Anemia-Patient with initial hgb 6.2 which improved to 8.4 after 2 units prbc. CE negative x3 and chest pain resolved with prbc. Anemia thought secondary to GI loss as patient on anticoagulation. Patient had aspirin and xarelto stopped. Hgb monitored over 2 more days and was stable at 7.7 or greater. In 01/2011, patient had an EGD with gastric AVMs noted. GI had performed a recent capsule endoscopy with no significant findings. Patient also with colonoscopy in February which [place] consulted here and said no current intervention needed. Patient given parenteral iron. Advised to continue iron. If hgb cannot be maintained >7, patient will need a repeat capsule endoscopy. Patient advised  to have no anticoagulation for current time with future anticoagulation discussions to be had on follow up visit with cardiology.  2. atrial fibrillation-continued patient on coreg and diltiazem. Patient rate controlled and hemodynamically stable. Held anticoagulation secondary to #1 3.  CKD-patient stable with CR at 1.25 below baseline of approximately 1.6.  4. Other chronic medical problems stable during hospitalization with no changes.    Procedures/Imaging:  1. CXR-Stable mild cardiomegaly with cardiac pacer.  No acute   cardiopulmonary disease. 2. CT Head-1.  No acute intracranial abnormality.   2.  7 mm sclerotic area in the clivus.  In the absence of any known   history of malignancy, this is statistically likely benign.  If the   patient has a known history of malignancy, then nuclear medicine   whole body bone scan could be considered for further evaluation.   Labs  Results for orders placed during the hospital encounter of 10/05/11 (from the past 72 hour(s))  PROTIME-INR     Status: Abnormal   Collection Time   10/06/11  6:00 AM      Component Value Range Comment   Prothrombin Time 15.8 (*) 11.6 - 15.2 (seconds)    INR 1.23  0.00 - 1.49    APTT     Status: Normal   Collection Time   10/06/11  6:00 AM      Component Value Range Comment   aPTT 30  24 - 37 (seconds)   CARDIAC PANEL(CRET KIN+CKTOT+MB+TROPI)     Status: Normal  Collection Time   10/06/11  6:00 AM      Component Value Range Comment   Total CK 37  7 - 177 (U/L)    CK, MB 3.4  0.3 - 4.0 (ng/mL)    Troponin I <0.30  <0.30 (ng/mL)    Relative Index RELATIVE INDEX IS INVALID  0.0 - 2.5    CBC     Status: Abnormal   Collection Time   10/06/11  6:00 AM      Component Value Range Comment   WBC 5.3  4.0 - 10.5 (K/uL)    RBC 2.73 (*) 3.87 - 5.11 (MIL/uL)    Hemoglobin 8.4 (*) 12.0 - 15.0 (g/dL)    HCT 24.4 (*) 36.0 - 46.0 (%)    MCV 89.4  78.0 - 100.0 (fL)    MCH 30.8  26.0 - 34.0 (pg)    MCHC 34.4  30.0 -  36.0 (g/dL)    RDW 15.2  11.5 - 15.5 (%)    Platelets 186  150 - 400 (K/uL)   COMPREHENSIVE METABOLIC PANEL     Status: Abnormal   Collection Time   10/06/11  6:00 AM      Component Value Range Comment   Sodium 140  135 - 145 (mEq/L)    Potassium 3.8  3.5 - 5.1 (mEq/L)    Chloride 106  96 - 112 (mEq/L)    CO2 26  19 - 32 (mEq/L)    Glucose, Bld 120 (*) 70 - 99 (mg/dL)    BUN 35 (*) 6 - 23 (mg/dL)    Creatinine, Ser 1.30 (*) 0.50 - 1.10 (mg/dL)    Calcium 9.3  8.4 - 10.5 (mg/dL)    Total Protein 5.9 (*) 6.0 - 8.3 (g/dL)    Albumin 2.9 (*) 3.5 - 5.2 (g/dL)    AST 15  0 - 37 (U/L)    ALT 7  0 - 35 (U/L)    Alkaline Phosphatase 39  39 - 117 (U/L)    Total Bilirubin 0.3  0.3 - 1.2 (mg/dL)    GFR calc non Af Amer 41 (*) >90 (mL/min)    GFR calc Af Amer 48 (*) >90 (mL/min)   CBC     Status: Abnormal   Collection Time   10/06/11 12:31 PM      Component Value Range Comment   WBC 5.3  4.0 - 10.5 (K/uL)    RBC 2.79 (*) 3.87 - 5.11 (MIL/uL)    Hemoglobin 8.4 (*) 12.0 - 15.0 (g/dL)    HCT 25.1 (*) 36.0 - 46.0 (%)    MCV 90.0  78.0 - 100.0 (fL)    MCH 30.1  26.0 - 34.0 (pg)    MCHC 33.5  30.0 - 36.0 (g/dL)    RDW 15.4  11.5 - 15.5 (%)    Platelets 194  150 - 400 (K/uL)   CBC     Status: Abnormal   Collection Time   10/07/11  4:07 AM      Component Value Range Comment   WBC 4.5  4.0 - 10.5 (K/uL)    RBC 2.58 (*) 3.87 - 5.11 (MIL/uL)    Hemoglobin 7.9 (*) 12.0 - 15.0 (g/dL)    HCT 23.3 (*) 36.0 - 46.0 (%)    MCV 90.3  78.0 - 100.0 (fL)    MCH 30.6  26.0 - 34.0 (pg)    MCHC 33.9  30.0 - 36.0 (g/dL)    RDW 15.3  11.5 - 15.5 (%)  Platelets 187  150 - 400 (K/uL)   RETICULOCYTES     Status: Abnormal   Collection Time   10/07/11  4:07 AM      Component Value Range Comment   Retic Ct Pct 6.9 (*) 0.4 - 3.1 (%)    RBC. 2.58 (*) 3.87 - 5.11 (MIL/uL)    Retic Count, Manual 178.0  19.0 - 186.0 (K/uL)   BASIC METABOLIC PANEL     Status: Abnormal   Collection Time   10/07/11  4:07 AM       Component Value Range Comment   Sodium 140  135 - 145 (mEq/L)    Potassium 4.0  3.5 - 5.1 (mEq/L)    Chloride 105  96 - 112 (mEq/L)    CO2 27  19 - 32 (mEq/L)    Glucose, Bld 112 (*) 70 - 99 (mg/dL)    BUN 30 (*) 6 - 23 (mg/dL)    Creatinine, Ser 1.25 (*) 0.50 - 1.10 (mg/dL)    Calcium 9.5  8.4 - 10.5 (mg/dL)    GFR calc non Af Amer 43 (*) >90 (mL/min)    GFR calc Af Amer 50 (*) >90 (mL/min)   VITAMIN B12     Status: Normal   Collection Time   10/07/11  4:07 AM      Component Value Range Comment   Vitamin B-12 472  211 - 911 (pg/mL)   FOLATE     Status: Normal   Collection Time   10/07/11  4:07 AM      Component Value Range Comment   Folate 11.5     FERRITIN     Status: Normal   Collection Time   10/07/11  4:07 AM      Component Value Range Comment   Ferritin 117  10 - 291 (ng/mL)   HEMOGLOBIN A1C     Status: Abnormal   Collection Time   10/07/11  4:07 AM      Component Value Range Comment   Hemoglobin A1C 5.8 (*) <5.7 (%)    Mean Plasma Glucose 120 (*) <117 (mg/dL)   IRON AND TIBC     Status: Abnormal-DRAWN AFTER IV IRON TRANSFULSION!!!   Collection Time   10/07/11  4:07 AM      Component Value Range Comment   Iron 615 (*) 42 - 135 (ug/dL)    TIBC 801 (*) 250 - 470 (ug/dL)    Saturation Ratios 77 (*) 20 - 55 (%)    UIBC 186  125 - 400 (ug/dL)   CBC     Status: Abnormal   Collection Time   10/08/11  5:10 AM      Component Value Range Comment   WBC 3.7 (*) 4.0 - 10.5 (K/uL)    RBC 2.49 (*) 3.87 - 5.11 (MIL/uL)    Hemoglobin 7.7 (*) 12.0 - 15.0 (g/dL)    HCT 22.9 (*) 36.0 - 46.0 (%)    MCV 92.0  78.0 - 100.0 (fL)    MCH 30.9  26.0 - 34.0 (pg)    MCHC 33.6  30.0 - 36.0 (g/dL)    RDW 15.9 (*) 11.5 - 15.5 (%)    Platelets 217  150 - 400 (K/uL)      Patient condition at time of discharge/disposition: stable  Disposition-home   Follow up issues: 1. Patient will need regular monitoring of hgb for next several weeks. Start at 1-2 weeks then space to 2-3 weeks.  If  hgb <7, will need to see  GI again for capsule endoscopy. Continue PO iron.  2. Please evaluate patient for any chest pain, dizziness, or shortness of breath. 2. Neuro-patient reportedly with recent TIA 2 weeks ago. Taken off of xarelto here. Given recurrent GI bleed, patient may not be a candidate for any anticoagulation including aspirin.   Discharge follow up:  Discharge Orders    Future Appointments: Provider: Department: Dept Phone: Center:   10/08/2011 3:00 PM [name], MD [facility] [place] Office [phone] [facility]   10/10/2011 2:15 PM Lovenia Shuck Med Resident [phone] [facility]   10/20/2011 1:30 PM [facility] Office [phone] [facility]   10/22/2011 12:15 PM Coralyn Mark, MD [place] [phone] [facility]   10/23/2011 10:00 AM [place] [phone] [facility]   12/02/2011 2:15 PM Coralyn Mark, MD [place] [phone] [facility]     Current Discharge Medication List    CONTINUE these medications which have NOT CHANGED   Details  acetaminophen (TYLENOL) 325 MG tablet Take 650 mg by mouth every 6 (six) hours as needed.      albuterol (PROAIR HFA) 108 (90 BASE) MCG/ACT inhaler two puffs every 4-6 hours as needed     ALPRAZolam (XANAX) 0.5 MG tablet Take 1 tablet (0.5 mg total) by mouth at bedtime as needed. For anxiety Qty: 30 tablet, Refills: 0    STOP ASPIRIN 81mg      carvedilol (COREG) 6.25 MG tablet Take 1 tablet (6.25 mg total) by mouth 2 (two) times daily with a meal. Qty: 60 tablet, Refills: 6   Associated Diagnoses: Atrial fibrillation; Essential hypertension, benign; Chronic diastolic heart failure    diltiazem (CARDIZEM) 90 MG tablet Take 1 tablet (90 mg total) by mouth daily. Qty: 30 tablet, Refills: 2   Associated Diagnoses: Atrial fibrillation    ferrous sulfate 325 (65 FE) MG tablet Take 1 tablet (325 mg total) by mouth 2 (two) times daily with a meal.     furosemide (LASIX) 40  MG tablet Take 1 tablet (40 mg total) by mouth daily. Qty: 30 tablet, Refills: 2    Multiple Vitamins-Minerals (CENTRUM) tablet Take 1 tablet by mouth daily.      Olopatadine HCl (PATADAY) 0.2 % SOLN Apply 1 drop to eye daily. Qty: 1 Bottle, Refills: 5   Associated Diagnoses: Allergic conjunctivitis of both eyes    Omega-3 Fatty Acids (FISH OIL PO) Take by mouth. Take 2 capsules by mouth daily     omeprazole (PRILOSEC) 20 MG capsule Take 1 capsule (20 mg total) by mouth 2 (two) times daily.     PARoxetine (PAXIL) 20 MG tablet Take 0.5 tablets (10 mg total) by mouth every morning. Qty: 30 tablet, Refills: 0   Associated Diagnoses: Anxiety state, unspecified    Probiotic Product (SUPER PROBIOTIC PO) Take 1 capsule by mouth daily.      STOP Rivaroxaban (XARELTO) 20 MG TABS Take 20 mg by mouth daily. Qty: 30 tablet, Refills: 6    rosuvastatin (CRESTOR) 20 MG tablet Take 1 tablet (20 mg total) by mouth daily. Qty: 30 tablet, Refills: 3    senna (SENOKOT) 8.6 MG tablet Take 1 tablet by mouth daily.      vitamin C (ASCORBIC ACID) 500 MG tablet Take 500 mg by mouth daily.      Vitamin b-12 1058mcg IM monthly     SignedGarret Reddish 10/08/2011, 12:13 PM Dictation [phone]

## 2011-10-10 ENCOUNTER — Encounter: Payer: Self-pay | Admitting: Family Medicine

## 2011-10-10 ENCOUNTER — Ambulatory Visit (INDEPENDENT_AMBULATORY_CARE_PROVIDER_SITE_OTHER): Payer: PRIVATE HEALTH INSURANCE | Admitting: Family Medicine

## 2011-10-10 DIAGNOSIS — R29898 Other symptoms and signs involving the musculoskeletal system: Secondary | ICD-10-CM

## 2011-10-10 DIAGNOSIS — R2230 Localized swelling, mass and lump, unspecified upper limb: Secondary | ICD-10-CM | POA: Insufficient documentation

## 2011-10-10 DIAGNOSIS — K922 Gastrointestinal hemorrhage, unspecified: Secondary | ICD-10-CM

## 2011-10-10 LAB — CBC
HCT: 27.5 % — ABNORMAL LOW (ref 36.0–46.0)
Hemoglobin: 8.8 g/dL — ABNORMAL LOW (ref 12.0–15.0)
MCH: 30.8 pg (ref 26.0–34.0)
MCV: 96.2 fL (ref 78.0–100.0)
RBC: 2.86 MIL/uL — ABNORMAL LOW (ref 3.87–5.11)

## 2011-10-10 NOTE — Assessment & Plan Note (Signed)
Will refer to dermatology. Precepted this with Dr. Dorcas Mcmurray at the patient's previous visit. We are wondering if it is a dermatofibroma. Does not appear malignant and appears superficial and to be a skin mass and not involving tendons, bones, or bursa. Patient would like this removed if possible since the size of it bothers her.

## 2011-10-10 NOTE — Assessment & Plan Note (Signed)
Seems improved based on how she looks and talks today. Had been on Paxil during her hospitalization but only took 1 time before that. Asked patient to re-start Paxil to help her anxiety and to help prevent her from relapsing in smoking and drinking.

## 2011-10-10 NOTE — Progress Notes (Signed)
  Subjective:    Patient ID: [name], female    DOB: Apr 05, 1946, 65 y.o.   MRN: [id number]  HPI Here for hospital follow-up for GIB requiring 2 units PRBC. Bleed is likely from known AVM.  She was discharged yesterday. Asked to stop taking her baby aspirin and Xarelto, that was recently started due to concern that she had a TIA 2 weeks ago.  She has no complaints other than for a few second dizzy spell after her discharge that resolved with sitting down.  2. Mass over left elbow Asking about dermatology referral to get this removed.  Has not grown in size. Does not itch or hurt. But the size of it bothers are and she would like it removed. It does not limit her function. ROS: denies weakness, paresthesias  3. Still refraining from tobacco and alcohol. Has not re-started Paxil yet after leaving hospital.   Review of Systems Denies chest pain, palpitations, difficulty breathing, blood in stool or sputum.    Objective:   Physical Exam Gen: NAD, accompanied by daughter Psych: not anxious appearing, calm, not depressed appearing CV: RRR Pulm: CTAB, no rales Ext: no pretibial edema Skin: fleshy 1-2 cm well-circumscribed skin mass over left elbow; same color as surrounding skin; no flakiness, erythema, discoloration, discharge, opening visible.    Assessment & Plan:

## 2011-10-10 NOTE — Assessment & Plan Note (Signed)
Blood pressure today is usual for patient.  Only 1 episode of lightheadedness that resolved with sitting down since discharged from hospital 2 days ago. Will check follow-up hemoglobin today. Will call patient with results. If normal, will check Hgb again in 2 weeks and then again 4 weeks after that.   Asked patient to call Dr. Nichola Sizer office regarding her next follow-up.

## 2011-10-10 NOTE — Patient Instructions (Addendum)
1. Take a vitamin C tablet, Coreg, and iron tablet together with a meal twice a day.  2. Take the Crestor at nighttime.   3. Please call Dr. Nichola Sizer office and ask them when they would like you to follow-up.  4. Please re-start the Paxil for your anxiety and smoking.   5. I will refer you to a dermatologist. If you do not hear from [place] in 1 week, then call our office.

## 2011-10-13 NOTE — H&P (Signed)
[name], [name] NO.:  [id number]  MEDICAL RECORD NO.:  [id number]  LOCATION:  [facility]                         FACILITY:  [facility]  PHYSICIAN:  Dalbert Mayotte, MD        DATE OF BIRTH:  [birth year]  DATE OF ADMISSION:  09/12/2011 DATE OF DISCHARGE:                             HISTORY & PHYSICAL   PRIMARY CARE PROVIDER:  Karen Kays, MD at [facility].  CHIEF COMPLAINTS:  Shortness of breath.  HISTORY OF PRESENT ILLNESS:  This is a 65 year old female who presents with shortness of breath and wheezing.  Symptoms include chest pain that is localized to left lateral chest, dyspnea on exertion, sputum production and wheezing.  Symptoms began 5 days ago and have been gradually worsening.  The patient denies edema of the lower extremities. Denies any postnasal drip.  Associated symptoms include dyspnea, productive cough, and wheezing.  The patient says that she has not had any recent travel, her weight has been stable and her appetite has been unaffected.  She says that her symptoms are made worse by any kind of exercise, alleviated by rest, oxygen, and albuterol.  The patient took several albuterol treatments at home, but was not improving, so she came on to the ER today.  She did not take any of her blood pressure medicines, regular a.m. medicines this morning.  She says that the albuterol does not give her any adverse effects.  The patient is a smoker.  She is smoking a few cigarettes per day.  She has been smoking since she was a teenager, but has tried to quit several times.  The patient also complains that she has some pain in the left lateral chest just underneath her breasts.  She says she feels like it is her ribs, she says that it does not feel like a heart attack, but she does say that her shortness of breath is worse with exertion.  She denies fevers, urinary symptoms, nausea, vomiting, or diarrhea.  She does  endorse constipation.  ALLERGIES:  ACE INHIBITORS.  PAST MEDICAL HISTORY: 1. Complete heart block. 2. Tobacco abuse. 3. Diastolic heart failure. 4. Anemia. 5. GI bleeding. 6. Pacemaker. 7. CKD. 8. Arthritis. 9. Anxiety. 10.Atrial fibrillation. 11.Hyperlipidemia. 12.Hypertension.  PAST SURGICAL HISTORY:  Significant for tubal ligation, pacemaker [employer] and cardiac catheterization.  SOCIAL HISTORY:  Significant for tobacco use.  She drinks alcohol occasionally about three drinks per week.  She denies any illicit drug use.  The patient lives alone and generally cares for herself, does all her ADLs, however, she has a daughter and a son that live in town, they are looking on her.  FAMILY HISTORY:  Significant for cardiac disease in the multiple family members including both parents.  Significant for diabetes in her siblings. Significant for CVAs in both of her parents.  CURRENT OUTPATIENT MEDICATIONS: 1. Tylenol 650 mg p.o. q.6 p.r.n. 2. Albuterol 2 puffs every 4-6 hours as needed. 3. Xanax 0.5 mg p.o. at bedtime p.r.n. anxiety. 4. Aspirin 81 mg p.o. daily. 5. Coreg 6.25 mg p.o. b.i.d. 6. Diltiazem 90 mg p.o. daily. 7. Ferrous sulfate 325 mg p.o. b.i.d. a.c. 8. Lasix 40 mg p.o. daily. 9. Multivitamin. 10.Pataday 0.2%  solution 1 drop to eye daily. 11.Fish oil supplement. 12.Prilosec 20 mg p.o. daily. 13.Probiotic supplement. 14.Crestor 20 mg p.o. daily. 15.Senokot 8.6 mg p.o. daily. 16.Vitamin C 500 mg p.o. daily.  REVIEW OF SYSTEMS:  Negative except as stated in HPI.  PHYSICAL EXAMINATION:  VITAL SIGNS:  Temperature 98.6, blood pressure 160/100, pulse 85, respiratory rate 18, pulse ox 96% on room air. GENERAL APPEARANCE:  The patient is alert, cooperative, but appears uncomfortable. EYES:  Pupils are equal, round, and reactive to light.  Extraocular movements intact.  Fundi benign. THROAT:  Oral mucosa is moist.  There are no lesions or exudates. NECK:  No  adenopathy.  No carotid bruit.  No JVD visible.  Trachea is midline, not enlarged. MUSCULOSKELETAL:  Back is symmetrical.  Range of motion normal.  No CVA tenderness. LUNGS:  The patient has expiratory wheezes throughout her lung fields and poor air movement, however, no crackles are heard. HEART:  Distant heart sounds, regular rate and rhythm. ABDOMEN:  Soft, nontender, nondistended.  Positive bowel sounds.  No organomegaly. CHEST WALL:  The patient has tenderness to palpation.  Her left lateral chest just under her breast crease no rashes or lesions are present there. EXTREMITIES:  Normal.  No edema.  Pulses are 2+ and symmetric. NEUROLOGIC:  Grossly normal.  A chest x-ray two-view significant for cardiomegaly and pulmonary edema and EKG shows a paced rhythm with a wide QRS. unchanged from previous.  LABORATORY:  CBC, white blood cell count 4.5, hemoglobin 10.9, hematocrit 33.2, platelets 240.  A metabolic panel, sodium [id number], potassium 3.6, chloride 013, CO2 of 28, BUN 15, creatinine 1.03, glucose 110, calcium 9.8, AST 7, AST 17, alkaline phosphatase 48, total bilirubin 0.8, protein is 12.1.  Cardiac markers CK total 66, CK-MB 3.2, troponin I is less than 0.30, and a BNP is 2730.  ASSESSMENT AND PLAN:  This is a 65 year old female with known chronic obstructive pulmonary disease and diastolic heart failure who presents to the ER with shortness of breath, wheezing, and complaining of some chest pain. 1. Pulmonary:  The patient clinically appears to be in a chronic     obstructive pulmonary disease exacerbation, she has wheezing,     increased work of breathing, and improvement subjectively with     albuterol, however, chest x-ray and BMP are concerning for     congestive heart failure and fluid overload as the cause of her     dyspnea.  We will admit the patient and treat her for chronic     obstructive pulmonary disease exacerbation.  She has been treated     with IV Solu-Medrol  and several albuterol and Atrovent nebulizers.     In the ED, we will continue steroids with p.o. prednisone.  We will     schedule albuterol treatments and treat with Avelox for a chronic     obstructive pulmonary disease exacerbation.  The patient received     40 mg of IV Lasix in the emergency department for congestive heart     failure.  We will give Lasix 40 mg p.o. b.i.d., monitor patient's     In's and Out's and daily weights.  We will repeat a chest x-ray in     the morning to monitor her pulmonary edema and order an     echocardiogram to evaluate for heart failure.  The patient may have     oxygen as needed, although upon arrival to the ER, she was not     hypoxic.  2. Cardiovascular:  The patient is complaining of left-sided chest     pain that she does not feel like it is cardiac.  She feels like it     is in her ribs, however she does have a significant cardiac history     and so we will cycle cardiac enzymes and repeat her EKG in the     morning.  She is also mildly hypertensive here in the emergency     department, but she did not take her blood pressure medications     this morning, so we will restart her home blood pressure     medications and her atrial fibrillation rate control medications.     We will give hydralazine p.r.n. for elevated blood pressures and     nitroglycerin as needed for chest pain.  We will continue her     aspirin 81 mg p.o. daily. 3. Heme:  The patient has a history of anemia and a GI bleed.  Her     hemoglobin today is 10.9 which is stable for her.  We will continue     her home iron supplementation. 4. Fluids, electrolytes, and nutrition, and Gastrointestinal:  We will     saline lock her IV considering the concern for her heart failure     and monitor her electrolytes while she is on Lasix, we will give a     heart-healthy diet.  We will give Protonix for gastrointestinal     prophylaxis considering her history of anemia, Hemoccult stools,      possible arteriovenous malformation, but again it is unclear the     exact source of her gastrointestinal bleed, GI workup has been     inconclusive. 5. Deep vein thrombosis prophylaxis.  Considering the history of the     gastrointestinal bleed of unknown source, we will give her     sequential compression devices to bilateral lower extremities. 6. Disposition is pending clinical improvement.    ______________________________ Cletus Gash, MD   ______________________________ Dalbert Mayotte, MD    CR/MEDQ  D:  09/12/2011  T:  09/12/2011  Job:  [id number]  Electronically Signed by Cletus Gash MD on 10/08/2011 07:17:02 PM Electronically Signed by Dalbert Mayotte MD on 10/13/2011 04:25:17 PM

## 2011-10-15 NOTE — Discharge Summary (Signed)
NAMEEMBERLI, LINNEHAN NO.:  [id number]  MEDICAL RECORD NO.:  [id number]  LOCATION:  2601                         FACILITY:  [facility]  PHYSICIAN:  Blane Ohara Sanela Evola, M.D.DATE OF BIRTH:  10/13/1946  DATE OF ADMISSION:  10/05/2011 DATE OF DISCHARGE:  10/08/2011                              DISCHARGE SUMMARY   PRIMARY CARE PHYSICIAN:  Karen Kays, MD at [facility].  GASTROENTEROLOGIST:  Lowella Bandy. [name], MD  CARDIOLOGIST:  Thompson Grayer, MD  CONSULTANTS:  [facility].  PRIMARY DISCHARGE DIAGNOSES: 1. Gastrointestinal bleed, on anticoagulation with history of gastric     arteriovenous malformations. 2. History of transient ischemic attack 2 weeks ago, started on     Xarelto.  SECONDARY DISCHARGE DIAGNOSES: 1. History of tobacco abuse. 2. History of gastrointestinal bleed. 3. Arteriovenous malformation of stomach. 4. Atrial fibrillation. 5. Chronic kidney disease with baseline creatinine of 1.6. 6. Hyperlipidemia. 7. Depression. 8. Diastolic heart failure with EF of 60%. 9. Arthritis. 10.Sleep apnea. 11.Asthma. 12.History of colonic polyp. 13.Fatty liver. 14.Hypertension. 15.History of panic attacks.  HOSPITAL COURSE:  The patient is a 65 year old female with a history of GI bleed with history of gastric AVM, AFib with pacemaker, history of recent TIA, started on Xarelto, and chronic anemia likely due to GI source, who presented with dizziness, shortness of breath, and chest pain, and was found have a hemoglobin of 6.2 on admission, now status post 2 units of packed red blood cells. 1. GI bleed/Anemia-Patient with initial hgb 6.2 which improved to 8.4 after 2 units prbc. CE negative x3 and chest pain resolved with prbc. Anemia thought secondary to GI loss as patient on anticoagulation. Patient had aspirin and xarelto stopped. Hgb  monitored over 2 more days and was stable at 7.7 or greater. In 01/2011, patient had an EGD with  gastric AVMs noted. GI had performed a recent capsule endoscopy with no significant findings. Patient also with colonoscopy in February which [place] consulted  here and said no current intervention needed. Patient given parenteral iron. Advised to continue iron. If hgb cannot be maintained >7, patient will need a repeat capsule endoscopy. Patient advised to have no anticoagulation for current time with future  anticoagulation discussions to be had on follow up visit with cardiology.  2. atrial fibrillation-continued patient on coreg and diltiazem. Patient rate controlled and hemodynamically stable. Held anticoagulation secondary to #1 3.  CKD-patient stable with CR at 1.25 below baseline of approximately 1.6.   4. Other chronic medical problems stable during hospitalization with no changes.    PROCEDURES/IMAGING:  1. CXR-Stable mild cardiomegaly with cardiac pacer.  No acute   cardiopulmonary disease. 2. CT Head-1.  No acute intracranial abnormality.   2.  7 mm sclerotic area in the clivus.  In the absence of any known   history of malignancy, this is statistically likely benign.  If the   patient has a known history of malignancy, then nuclear medicine   whole body bone scan could be considered for further evaluation.   LABS: 1. PT 15.8, INR 1.23. 2. Cardiac enzymes negative x3. 3. Hemoglobin on admission 6.2, hemoglobin status post transfusion     8.4, hemoglobin day before discharge was 7.9, hemoglobin on  the day     of discharge 7.7. 4. Reticulocyte 6.9%. 5. Anemia panel and vitamin B12 472 within normal limits, folate 11.5,     ferritin 117, iron 615, TIBC 801, percent saturation 77%.  Please     note the patient had received IV Feraheme before collection of     anemia panel. 6. CBC on the day of discharge; white count 3.7, hemoglobin 7.7,     platelets 217, hematocrit 22.9. 7. BMET on the day before discharge, 140, 4.0, 105, 27, 112, 30, 1.25,     9.5.  CONDITION AT THE TIME  OF DISCHARGE:  Stable and able to be discharged home.  FOLLOWUP ISSUES: 1. The patient will need regular monitoring of hemoglobin for the next     several weeks, start at 1-2 weeks and spaced to 2 to 3 weeks.     Hemoglobin less than 7, will need to see GI again for capsule     endoscopy.  Continue p.o. iron. 2. Please evaluate the patient for any chest pain, dizziness, or     shortness of breath 3. Neurologic.  The patient reportedly with recent TIA 2 weeks ago.     She was taken off Xarelto here.  Given recurrent GI bleed, the     patient may not be a candidate for any anticoagulation including     aspirin.  DISCHARGE FOLLOWUP:  With Dr. Delfin Edis of Gastroenterology on October 08, 2011, at 3 p.m.  With Dr. Toney Reil of [facility] on October 10, 2011, at 2:15 p.m.  With Dr. Thompson Grayer of [facility] on October 22, 2011.  DISCHARGE MEDICATIONS: 1. Tylenol, take 650 mg by mouth every 6 hours as needed for pain. 2. Albuterol 90 mcg 2 puffs every 4-6 hours as needed. 3. Xanax 0.5 mg 1 tablet, 0.5 mg by mouth daily at bedtime as needed. 4. The patient is to stop aspirin. 5. Coreg 6.25 mg b.i.d. 6. Diltiazem 90 mg once daily. 7. Ferrous sulfate 325 mg b.i.d. with meal. 8. Lasix 40 mg by mouth daily. 9. Daily multivitamin. 10.Olopatadine 0.2% solution, apply 1 drop to eye daily for allergic     conjunctivitis of both eyes. 11.Omega-3 fatty acid, take by mouth 2 capsules daily. 12.Prilosec 20 mg, take 1 capsule by mouth b.i.d. 13.Paxil 20 mg, take 0.5 tablets by mouth every morning for anxiety. 14.Probiotic product, take 1 capsule by mouth daily. 15.Stop Xarelto/rivaroxaban. 16.Crestor 20 mg daily. 17.Senna 8.6 mg by mouth daily. 18.Vitamin C 500-mg tablet 1 by mouth daily. 19.Vitamin B12 1000 mcg IM monthly.    ______________________________ Garret Reddish, MD   ______________________________ Blane Ohara Kalissa Grays, M.D.    SH/MEDQ  D:   10/09/2011  T:  10/09/2011  Job:  [id number]  cc:   Karen Kays, MD Lowella Bandy. [name], MD Thompson Grayer, MD  Electronically Signed by Garret Reddish MD on 10/11/2011 05:51:06 PM Electronically Signed by Lissa Morales M.D. on 10/15/2011 08:00:20 AM

## 2011-10-16 NOTE — H&P (Signed)
[name], BOGUCKI NO.:  [id number]  MEDICAL RECORD NO.:  [id number]  LOCATION:  2601                         FACILITY:  [facility]  PHYSICIAN:  Jamal Collin. Hensel, M.D.DATE OF BIRTH:  Jun 19, 1946  DATE OF ADMISSION:  10/05/2011 DATE OF DISCHARGE:                             HISTORY & PHYSICAL   PRIMARY CARE PHYSICIAN:  Karen Kays, MD  CHIEF COMPLAINT:  Dizziness, chest pain, and dyspnea.  HISTORY OF PRESENT ILLNESS:  This is a 65 year old female with a history of atrial fibrillation, on anticoagulation, gastric AVMs, and anemia, who presents with dizziness, exertional dyspnea, and intermittent chest pain that began 5 days prior to admission.  She also complains of lightheadedness, palpitations, and headache today.  She underwent capsule endoscopy 6 weeks ago due to history of gastric AVMs and chronic blood loss anemia that was negative for active bleeding.  Approximately 2 weeks ago, the patient was evaluated for some left-sided weakness and facial numbness that was transient and was thought to be a TIA. Notably, she was previously on Coumadin for her atrial fibrillation but after negative endoscopy and new TIA, she was started on anticoagulation with Xarelto which again 6 days ago.  The patient has been having intermittent black tarry stools for a month and her last bowel movement was yesterday.  She also endorses melena and bright red blood per rectum after straining with her bowel movement 2 days ago, which she thought to be due to her chronic hemorrhoids.  The patient endorses some abdominal pain that was transient yesterday but none today and is eating normally and feels hungry.  On presentation to the emergency department today, she is found to be hemodynamically stable and free of chest pain, however, her hemoglobin is 6.2 which is decreased from a previous value of 10 obtained 3 weeks ago.  REVIEW OF SYSTEMS:  She endorses a pleuritic rash on her  chest that began this week.  She denies any one-sided weakness, numbness, dysarthria, dysphagia, hematemesis, edema, abnormal bruising or bleeding, decreased appetite or weight changes.  PAST MEDICAL HISTORY: 1. History of tobacco use. 2. Anemia. 3. AV malformation of stomach. 4. Atrial fibrillation. 5. Chronic kidney disease with baseline creatinine 1.6. 6. Hyperlipidemia. 7. Depression. 8. Diastolic heart failure, EF 55-60%. 9. Arthritis. 10.Sleep apnea. 11.Asthma. 12.History of colonic polyp. 13.Fatty liver. 14.Hypertension. 15.History of GI bleed. 16.History of panic attacks.  PAST SURGICAL HISTORY: 1. BTL. 2. Pacemaker 3. Cardiac cath.  FAMILY HISTORY:  Mother with hypertension and stroke.  Father with hypertension and stroke.  Siblings with hypertension, colon cancer, diabetes, heart disease, alcohol abuse.  SOCIAL HISTORY:  The patient has stopped smoking cigarettes and drinking about 1 month ago.  ALLERGIES:  ACE INHIBITORS cause angioedema.  MEDICATIONS: 1. Tylenol 650 mg p.o. p.r.n. 2. Albuterol inhaler p.r.n. 3. Xanax 0.5 mg p.o. at bedtime p.r.n. 4. Aspirin 81 mg daily. 5. Coreg 6.25 mg b.i.d. 6. Diltiazem 90 mg p.o. daily. 7. Iron sulfate 325 mg daily. 8. Lasix 40 mg p.o. daily. 9. Multivitamin. 10.Pataday eye drops. 11.Omega-3 fatty acids. 12.Prilosec 20 mg b.i.d. 13.Paxil 10 mg q.a.m. 14.Probiotic. 15.Xarelto 20 mg p.o. daily. 16.Crestor 20 mg p.o. daily. 17.Vitamin C. 18.Senokot 8.6 mg tablets daily.  PHYSICAL EXAMINATION:  VITAL SIGNS:  Temperature 98.6, blood pressure 130/75 to 118/71, pulse 67, respirations 18, 100% on room air. GENERAL APPEARANCE:  No apparent distress. HEENT:  Normocephalic, atraumatic.  PERRLA.  EOMI.  Mucous membranes are moist.  No lesions noted. CARDIOVASCULAR:  Regular rate and rhythm with normal heart sounds.  No murmurs or gallops. RESPIRATORY:  Effort is normal.  Breath sounds are normal.  No  rales, wheezes, or crackles. GI:  Soft.  Abdomen is not distended, nontender.  No rebound and no guarding.  Bowel sounds are present. MUSCULOSKELETAL:  No edema, no tenderness in lower extremities. NEUROLOGIC:  The patient is alert and oriented x3.  No cranial nerve deficits.  No coordination deficits.  5/5 bilateral upper and lower extremity strength and fluent speech. SKIN:  There are a few scattered papules on chest with no bleeding or urticaria or erythema.  LABS AND STUDIES:  BNP 912.  White blood count 4.2, hemoglobin 6.2, hematocrit 18.4, MCV 91, platelets 174.  INR 1.99, PT 22.9, PTT 35. Sodium 138, potassium 3.8, chloride 104, CO2 25, glucose 116, BUN 45, creatinine 1.52, calcium 9.4, protein 6.0, albumin 3.2, AST 14, ALT 8, alk phos 38, total bili 0.2.  Troponin I point of care 0.14.  IMAGING DATA: 1. CT head shows no acute intracranial abnormalities, a 7-mm sclerotic     area and the gliosis, likely benign. 2. Chest x-ray shows stable mild cardiomegaly with pacer and no acute     pulmonary disease. 3. EKG shows paced rhythm.  ASSESSMENT AND PLAN:  This is a 65 year old female with a history of fibrillation, on anticoagulation with gastric arteriovenous malformations, who presents with symptomatic blood loss anemia and chest pain.  1. Blood loss anemia.  The patient is hemodynamically stable with no     signs of hemorrhage currently, however, she is clearly asymptomatic     from her anemia.  Her last hemoglobin was 10.5 approximately 3     weeks ago.  This is likely an acute on chronic problem as she has     had a hemoglobin in the 5 range back in February of this year when     her gastric AVMs were diagnosed.  Clearly, the present is an     exacerbation after restarting her anticoagulation.  We will type     and cross and transfuse 2 units packed red blood cells in the     setting of associated chest pain.  I spoke with Dr. [name] of     [facility] who performed  multiple endoscopies on this patient and     given her history of arteriovenous malformations, there is no     further intervention warranted at this time.  We will treat     symptomatically with transfusion tonight, monitor for signs of     increased bleeding in step-down unit.  We will start Protonix 40 mg     IV b.i.d. and hold her Xarelto, aspirin, and avoid any further     chemoprophylaxis for deep vein thrombosis.  Her INR is mildly     elevated which I believe is due to her thrombin inhibitors,     therefore we will get vitamin K 10 mg p.o. x1 and recheck INR in     the a.m.  The patient may also benefit from IV iron therapy after     her transfusion is complete and she is stable. 2. Chest pain.  She is not actively having chest pain and her  EKG is     unrevealing.  We will continue cycling cardiac enzymes to assess     for cardiac strain or ischemia exacerbated by her anemia.  We will     repeat EKG in the morning.  The patient would not be a candidate     for heparin or aspirin products likely.  We will monitor in the     step-down unit. 3. Atrial fibrillation.  The patient is high risk for stroke, however,     has repeatedly failed anticoagulation secondary to gastrointestinal bleed.  Wewill hold anticoagulation for now and plan to notify her primary     cardiologist, Dr. Rayann Heman, in the morning.  We will continue calcium     channel blocker and beta-blocker tonight as she is hemodynamically     stable and rate controlled. 4. Diastolic congestive heart failure.  There is no sign of fluid     overload currently.  We will monitor closely following transfusion     and restart her home Lasix 40 mg if indicated.  Borderline BNP is     noted, but there is no fluid or signs of heart failure on chest x-     ray. 5. Anxiety and depression.  The patient takes Xanax as needed for     anxiety and recently started on a low dose of Paxil as an     outpatient.  I believe her current symptoms  to be related to     anemia, but they are less likely related to this medicine.  We will     continue her current dosage to avoid withdrawal syndrome and follow up as     an outpatient. 6. Hypertension. Controlled currently, will continue home meds. 7. Fluids, electrolytes, and nutrition/gastrointestinal.  This patient     has chronic and acute bleeding and GI is not planning any     procedures.  We will allow clears and advance as tolerated if the     patient is hungry and she has been eating normally at home.  Will KVO, keep peripheral IV, and     follow up electrolytes in the morning. 8. Prophylaxis.  We will start SCDs for deep vein thrombosis     prophylaxis and Protonix 40 mg b.i.d. 9. Disposition.  Step-down unit for close monitoring.  The patient and     daughter desire full code status.    ______________________________ Luis Abed, MD   ______________________________ Jamal Collin. Andria Frames, M.D.    Rockne Coons  D:  10/05/2011  T:  10/06/2011  Job:  [id number]  Electronically Signed by Luis Abed MD on 10/13/2011 02:27:50 PM Electronically Signed by Madison Hickman M.D. on 10/16/2011 10:11:47 AM

## 2011-10-21 ENCOUNTER — Encounter: Payer: Self-pay | Admitting: Family Medicine

## 2011-10-21 ENCOUNTER — Ambulatory Visit (INDEPENDENT_AMBULATORY_CARE_PROVIDER_SITE_OTHER): Payer: PRIVATE HEALTH INSURANCE | Admitting: Family Medicine

## 2011-10-21 VITALS — BP 100/70 | HR 70 | Temp 97.9°F | Ht 65.0 in | Wt 185.0 lb

## 2011-10-21 DIAGNOSIS — D649 Anemia, unspecified: Secondary | ICD-10-CM

## 2011-10-21 DIAGNOSIS — F411 Generalized anxiety disorder: Secondary | ICD-10-CM

## 2011-10-21 MED ORDER — ALPRAZOLAM 0.5 MG PO TABS: 0.5000 mg | ORAL_TABLET | Freq: Every evening | ORAL | Status: DC | PRN

## 2011-10-21 NOTE — Assessment & Plan Note (Signed)
Will check CBC today as recommended.  Not having symptoms of anemia. Will re-check in 4 weeks.

## 2011-10-21 NOTE — Assessment & Plan Note (Signed)
Continues to improve. Not taking Paxil, did not feel like she needed it and felt like she is taking too many medications. Xanax helps her anxiety. Taking 4-5 tablets a week. Continues to abstain for drinking and smoking.

## 2011-10-21 NOTE — Progress Notes (Signed)
  Subjective:    Patient ID: [name], female    DOB: March 09, 1946, 65 y.o.   MRN: [id number]  HPI Follow-up for anemia. Hospitalized last month due to anemia requiring blood transfusions due to presumed GIB due to being on anti-coagulants.  Felt chest pain, tired, dyspneic when she was anemic. Denies these symptoms today. Denies chest pain, blood in stool, dark stools.  2. Left foot pain x 2 days Not sure if she hurt it Achey pain that is now improving ROS: denies decreased ROM, fevers, swelling  3. Anxiety Not taking Paxil. Does not want to. Has not gone back to drinking or smoking.  Xanax helps her anxiety.  ROS: no chest pain since hospitalization   Review of Systems Per HPI    Objective:   Physical Exam Gen: NAD, accompanied by daughter Psych: not anxious appearing; calm; appropriate to questions CV: RRR, no murmurs Pulm: CTAB, no rales Ext: no edema, 2+ pedal pulses Left foot: no swelling, erythema, tenderness    Assessment & Plan:

## 2011-10-21 NOTE — Patient Instructions (Signed)
I am very proud of you for still not smoking or drinking. Keep it up!  Regarding your blood test today, if your lab results are normal, I will send you a letter with the results. If abnormal, someone at the clinic will get in touch with you.   Return to the clinic in 4 weeks for a lab visit and we will check your blood count again then.   Follow-up with me in 3 months for medication re-fills and for follow-up on your blood pressure.

## 2011-10-22 ENCOUNTER — Ambulatory Visit (INDEPENDENT_AMBULATORY_CARE_PROVIDER_SITE_OTHER): Payer: Medicaid Other | Admitting: Internal Medicine

## 2011-10-22 ENCOUNTER — Encounter: Payer: Self-pay | Admitting: Internal Medicine

## 2011-10-22 DIAGNOSIS — Z950 Presence of cardiac pacemaker: Secondary | ICD-10-CM

## 2011-10-22 DIAGNOSIS — I5032 Chronic diastolic (congestive) heart failure: Secondary | ICD-10-CM

## 2011-10-22 DIAGNOSIS — I509 Heart failure, unspecified: Secondary | ICD-10-CM

## 2011-10-22 DIAGNOSIS — I4891 Unspecified atrial fibrillation: Secondary | ICD-10-CM

## 2011-10-22 DIAGNOSIS — I442 Atrioventricular block, complete: Secondary | ICD-10-CM

## 2011-10-22 DIAGNOSIS — I10 Essential (primary) hypertension: Secondary | ICD-10-CM

## 2011-10-22 DIAGNOSIS — K922 Gastrointestinal hemorrhage, unspecified: Secondary | ICD-10-CM

## 2011-10-22 LAB — CBC
HCT: 31.6 % — ABNORMAL LOW (ref 36.0–46.0)
Hemoglobin: 10 g/dL — ABNORMAL LOW (ref 12.0–15.0)
MCH: 30.5 pg (ref 26.0–34.0)
MCHC: 31.6 g/dL (ref 30.0–36.0)
MCV: 96.3 fL (ref 78.0–100.0)
RDW: 14.4 % (ref 11.5–15.5)

## 2011-10-22 LAB — PACEMAKER DEVICE OBSERVATION
BATTERY VOLTAGE: 2.79 V
BRDY-0004RV: 120 {beats}/min
RV LEAD THRESHOLD: 1 V

## 2011-10-22 NOTE — Assessment & Plan Note (Signed)
Normal pacemaker function See Pace Art report No changes today  

## 2011-10-22 NOTE — Assessment & Plan Note (Signed)
Follow-up with Dr [name]

## 2011-10-22 NOTE — Assessment & Plan Note (Signed)
Stable Stop diltiazem

## 2011-10-22 NOTE — Progress Notes (Signed)
The patient presents today for routine electrophysiology followup.  Since last being seen in our clinic, the patient was again hospitalized for GI bleeding.  Her ASA and Xarelto were discontinued.  She has done well since that time.  Her SOB has significantly improved and her edema has resolved. She has an ongoing GI evaluation by Dr [name] but missed her appointment earlier this week.  She has rare dizziness. Today, she denies symptoms of  chest pain, shortness of breath, orthopnea, PND,presyncope, syncope, or neurologic sequela.  The patient feels that she is tolerating medications without difficulties and is otherwise without complaint today.   Past Medical History  Diagnosis Date  . Angioedema     2/2 ACE  . Tobacco dependence     Quit January 2012; tried 1 cigarette March 2012, did not enjoy,  resolved to stay quit.   [name] RLS (restless legs syndrome)     Dx 06/2007  . Anemia   . Arteriovenous malformation of stomach   . Permanent atrial fibrillation   . Renal failure     baseline creatinine 1.6  . Hyperlipidemia   . Depression   . Diastolic heart failure   . Arthritis   . Sleep apnea   . Asthma   . Hx of colonic polyp 08/13/10    adenomatous  . Fatty liver 07/26/10  . Panic attacks   . Hypertension   . Complete heart block     s/p PPM 1998  . Hypertrophic cardiomyopathy     dx by Dr [name] 2009  . Hx of colonoscopy   . GI bleed   . Panic attack   . DDD (degenerative disc disease)   . History of alcohol abuse Stopped Fall 2012  . History of tobacco use Quit Fall 2012   Past Surgical History  Procedure Date  . Tubal ligation 04/01/2000  . Pacemaker insertion [place], most recent gen change by [name] 4/12  . Cardiac catheterization     Current Outpatient Prescriptions  Medication Sig Dispense Refill  . albuterol (PROAIR HFA) 108 (90 BASE) MCG/ACT inhaler two puffs every 4-6 hours as needed       . ALPRAZolam (XANAX) 0.5 MG tablet Take 1 tablet (0.5 mg total) by mouth  at bedtime as needed. For anxiety  30 tablet  2  . carvedilol (COREG) 6.25 MG tablet Take 1 tablet (6.25 mg total) by mouth 2 (two) times daily with a meal.  60 tablet  6  . ferrous sulfate 325 (65 FE) MG tablet Take 1 tablet (325 mg total) by mouth 2 (two) times daily with a meal.  60 tablet  3  . furosemide (LASIX) 40 MG tablet Take 1 tablet (40 mg total) by mouth daily.  30 tablet  2  . Multiple Vitamins-Minerals (CENTRUM) tablet Take 1 tablet by mouth daily.        . Olopatadine HCl (PATADAY) 0.2 % SOLN Apply 1 drop to eye daily.  1 Bottle  5  . Omega-3 Fatty Acids (FISH OIL PO) Take by mouth. Take 2 capsules by mouth daily       . omeprazole (PRILOSEC) 20 MG capsule Take 1 capsule (20 mg total) by mouth 2 (two) times daily.  60 capsule  3  . Probiotic Product (SUPER PROBIOTIC PO) Take 1 capsule by mouth daily.        . rosuvastatin (CRESTOR) 20 MG tablet Take 1 tablet (20 mg total) by mouth daily.  30 tablet  3  .  senna (SENOKOT) 8.6 MG tablet Take 1 tablet by mouth daily.        . vitamin C (ASCORBIC ACID) 500 MG tablet Take 500 mg by mouth daily.         Current Facility-Administered Medications  Medication Dose Route Frequency Provider Last Rate Last Dose  . cyanocobalamin ((VITAMIN B-12)) injection 1,000 mcg  1,000 mcg Intramuscular Q30 days [name], MD   1,000 mcg at 09/16/11 1341    Allergies  Allergen Reactions  . Ace Inhibitors     REACTION: angioedema    History   Social History  . Marital Status: Divorced    Spouse Name: N/A    Number of Children: 4  . Years of Education: N/A   Occupational History  . disabled    Social History Main Topics  . Smoking status: Former Smoker -- 0.1 packs/day    Types: Cigarettes    Quit date: 08/16/2011  . Smokeless tobacco: Never Used   Comment: Made decision to quit after hospitalization. Relapsed a few days ago and had one "drag" but found it unpleasant. Has not smoked since then.   . Alcohol Use: 1.5 oz/week    3  drink(s) per week  . Drug Use: No  . Sexually Active: Not on file   Other Topics Concern  . Not on file   Social History Narrative   On disability for heart failure/pacemaker.  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 4 grown children,  8 grandchildren.  Drinks alcohol a few times a week socially. At one time, the most she reports drinking is about 3 mixed drinks.Lives with daughter [name] and son. Will be moving 04/2011 to different house because concerned children are taking advantage of her financial situation. No illicit drugs.Smokes few cigarettes daily, especially when stressed. Started smoking @ age 14. Quit January 2012 2/2 health but started smoking again 02/2011.     Family History  Problem Relation Age of Onset  . Hypertension Mother   . Stroke Mother   . Hypertension Father   . Stroke Father   . Hypertension Sister   . Hypertension Brother   . Colon cancer Brother   . Diabetes Sister   . Diabetes Brother   . Cancer Brother   . Heart attack Brother   . Heart attack Father   . Heart disease Mother   . Heart disease Sister   . Alcohol abuse Brother   . Alcohol abuse Father     ROS-  All systems are reviewed and are negative except as outlined in the HPI above    Physical Exam: Filed Vitals:   10/22/11 1158  BP: 109/75  Pulse: 69  Height: 5\' 5"  (1.651 m)  Weight: 186 lb (84.369 kg)    GEN- The patient is well appearing, alert and oriented x 3 today.   Head- normocephalic, atraumatic Eyes-  Sclera clear, conjunctiva pink Ears- hearing intact Oropharynx- clear Neck- supple, no JVP Lymph- no cervical lymphadenopathy Lungs- Clear to ausculation bilaterally, normal work of breathing Chest- pacemaker pocket is well healed Heart- Regular rate and rhythm (paced) GI- soft, NT, ND, + BS Extremities- no clubbing, cyanosis, or edema MS- no significant deformity or atrophy Skin- no rash or lesion Psych- euthymic mood, full affect Neuro- strength  and sensation are intact  Pacemaker interrogation- reviewed in detail today,  See PACEART report  Assessment and Plan:

## 2011-10-22 NOTE — Patient Instructions (Addendum)
Your physician wants you to follow-up in: 6 months with Dr Vallery Ridge will receive a reminder letter in the mail two months in advance. If you don't receive a letter, please call our office to schedule the follow-up appointment.   Your physician has recommended you make the following change in your medication:  1) STOP Diltiazem

## 2011-10-22 NOTE — Assessment & Plan Note (Signed)
Due to recurrent GI bleeding, she is not a candidate for anticoagulation at this time Stop diltiazem as she has complete heart block with steady V rate control (pacer dependant).

## 2011-10-23 ENCOUNTER — Other Ambulatory Visit: Payer: PRIVATE HEALTH INSURANCE | Admitting: *Deleted

## 2011-11-03 ENCOUNTER — Encounter: Payer: Self-pay | Admitting: Family Medicine

## 2011-11-03 ENCOUNTER — Ambulatory Visit (HOSPITAL_COMMUNITY)
Admission: RE | Admit: 2011-11-03 | Discharge: 2011-11-03 | Disposition: A | Discharge status: Home / Self Care | Payer: PRIVATE HEALTH INSURANCE | Source: Ambulatory Visit | Attending: Family Medicine | Admitting: Family Medicine

## 2011-11-03 ENCOUNTER — Ambulatory Visit (INDEPENDENT_AMBULATORY_CARE_PROVIDER_SITE_OTHER): Payer: PRIVATE HEALTH INSURANCE | Admitting: Family Medicine

## 2011-11-03 VITALS — BP 123/83 | HR 69 | Temp 99.0°F | Ht 65.0 in | Wt 188.4 lb

## 2011-11-03 DIAGNOSIS — R059 Cough, unspecified: Secondary | ICD-10-CM | POA: Insufficient documentation

## 2011-11-03 DIAGNOSIS — R062 Wheezing: Secondary | ICD-10-CM

## 2011-11-03 DIAGNOSIS — I517 Cardiomegaly: Secondary | ICD-10-CM | POA: Insufficient documentation

## 2011-11-03 DIAGNOSIS — I10 Essential (primary) hypertension: Secondary | ICD-10-CM | POA: Insufficient documentation

## 2011-11-03 DIAGNOSIS — R05 Cough: Secondary | ICD-10-CM | POA: Insufficient documentation

## 2011-11-03 DIAGNOSIS — I4891 Unspecified atrial fibrillation: Secondary | ICD-10-CM | POA: Insufficient documentation

## 2011-11-03 MED ORDER — IPRATROPIUM BROMIDE 0.02 % IN SOLN
0.5000 mg | Freq: Once | RESPIRATORY_TRACT | Status: AC
  Administered 2011-11-03: 0.5 mg via RESPIRATORY_TRACT

## 2011-11-03 MED ORDER — ALBUTEROL SULFATE (2.5 MG/3ML) 0.083% IN NEBU
2.5000 mg | INHALATION_SOLUTION | Freq: Once | RESPIRATORY_TRACT | Status: AC
  Administered 2011-11-03: 2.5 mg via RESPIRATORY_TRACT

## 2011-11-03 MED ORDER — PREDNISONE 20 MG PO TABS: ORAL_TABLET | ORAL | Status: DC

## 2011-11-03 NOTE — Patient Instructions (Signed)
I hope you feel better soon. Please go to the hospital to get a chest xray. I sent a prescription for perdnisone to your pharamcy. If you feel worse, please come back to see [place] or go to the emergency room. I will call you if we need to start an antibiotic.

## 2011-11-04 DIAGNOSIS — R062 Wheezing: Secondary | ICD-10-CM | POA: Insufficient documentation

## 2011-11-04 NOTE — Progress Notes (Addendum)
  Subjective:    Patient ID: [name], female    DOB: 11/30/1946, 65 y.o.   MRN: [id number]  HPI 65 yo female with recent discharge from hospital for GI bleed requiring transfusion presents with  Cough, congestion, sneezing for 1 week.  Also with wheezing at night, + chills no fevers.  Cough productive of yellow phelgm, no blood.  + fatigue. Using her PRoair 2-3 times a day.  Symptoms started when her granddaughter's dog came to visit.  The dog is now staying at her house while her granddaughter is deployed to [place] area to help with cleanup.  Has had dog stay in past and generally gets sick after dog has been with her. Quit smoking 2.5 months ago.  No ETOH.  + second hand smoke exposure at home.      Review of Systems See HPI     Objective:   Physical Exam  Constitutional: She appears well-developed and well-nourished. No distress.  HENT:  Head: Normocephalic and atraumatic.  Right Ear: External ear normal.  Left Ear: External ear normal.  Mouth/Throat: Oropharynx is clear and moist. No oropharyngeal exudate.       Pt with inflammed turbinates.  Eyes: Conjunctivae are normal. Right eye exhibits no discharge. Left eye exhibits no discharge. No scleral icterus.  Cardiovascular: Normal rate, regular rhythm and normal heart sounds.  Exam reveals no gallop and no friction rub.   No murmur heard. Pulmonary/Chest: Effort normal. No respiratory distress. She has wheezes. She has no rales.       Pre nbulizer:  Prolonged exp phase with exp wheezes throughtout.  Good air movement. Post neb:  Still with prolonged exp phase.  Decreased wheeze.  Skin: She is not diaphoretic.          Assessment & Plan:

## 2011-11-04 NOTE — Assessment & Plan Note (Signed)
Pt with wheezing. Possibly allergic rxn to dog in house.  May have underlying COPD with h/o smoking.  Will treat with prednisone burst and check CXR.  Likely will need inhaled steroid- can reevaluate after current treatment. Consider PFTs when improved.  Red flags reviewed. Encouraged to minimize second hand smoke.

## 2011-11-20 ENCOUNTER — Ambulatory Visit (INDEPENDENT_AMBULATORY_CARE_PROVIDER_SITE_OTHER): Payer: Medicaid Other | Admitting: Family Medicine

## 2011-11-20 ENCOUNTER — Encounter: Payer: Self-pay | Admitting: Family Medicine

## 2011-11-20 ENCOUNTER — Other Ambulatory Visit: Payer: Self-pay | Admitting: *Deleted

## 2011-11-20 VITALS — BP 121/79 | HR 84 | Temp 98.2°F | Wt 192.0 lb

## 2011-11-20 DIAGNOSIS — K922 Gastrointestinal hemorrhage, unspecified: Secondary | ICD-10-CM

## 2011-11-20 DIAGNOSIS — D649 Anemia, unspecified: Secondary | ICD-10-CM

## 2011-11-20 DIAGNOSIS — N183 Chronic kidney disease, stage 3 unspecified: Secondary | ICD-10-CM

## 2011-11-20 DIAGNOSIS — R079 Chest pain, unspecified: Secondary | ICD-10-CM

## 2011-11-20 LAB — CBC
Hemoglobin: 8.9 g/dL — ABNORMAL LOW (ref 12.0–15.0)
MCH: 29.9 pg (ref 26.0–34.0)
MCV: 95 fL (ref 78.0–100.0)
Platelets: 251 10*3/uL (ref 150–400)
RBC: 2.98 MIL/uL — ABNORMAL LOW (ref 3.87–5.11)
WBC: 4 10*3/uL (ref 4.0–10.5)

## 2011-11-20 NOTE — Assessment & Plan Note (Addendum)
Checking CBC today per Dr. Nichola Sizer recommendation following hospital discharge. Feels tired but not as tired as when she had bleed. No active bleeding. Continuing to refrain from aspirin. Following up with Dr. [name] 12/10. Will f/u on her recommendations regarding resumption of aspirin. Patient would prefer not to take.   Update:  Hgb 8.9 today. Was 10.0 last month. As low at 7.7 in the hospital where she received transfusion.  Called patient's number and left message with results. I think it would be appropriate for her to follow-up with Dr. [name] (who she sees on 12/10). But I am concerned because patient is having mild worsening of chronic chest pain, and I am concerned this may be due to her anemia. I advised patient to go to the ED if her chest pain worsens or is not alleviated by the NTG. I asked the patient to call the clinic back and let me know she received my message.  I also am faxing letter to Dr. [name] regarding patient's drop in Hgb.

## 2011-11-20 NOTE — Assessment & Plan Note (Signed)
Left-sided. Non-specific but does have significant history of cardiac disease and chronic left-sided chest pain. This is similar to her chronic pain but may be worse (patient mentioned chest pain on ROS). Advised her to use NTG prn. Has appointment with Cards next 1-2 weeks. Given red flags to return to the ED. No chest pain currently and denies palpitations or dyspnea.   Right-sided. Appears musculoskeletal. Likely associated with cough and URI symptoms she was recently seen for. Cough is persistent. May take cough medication prn. Tylenol prn for pain.

## 2011-11-20 NOTE — Progress Notes (Signed)
  Subjective:    Patient ID: [name], female    DOB: [birth year], 65 y.o.   MRN: [id number]  HPI Follow-up of GIB. Complaining of some tiredness but doesn't feel as bad as last time she had GIB.  Denies bleeds. Still refraining from aspirin due to bleed.  2. Cardiac Has appointment 12/18 ROS: complaining of occasional chest pain. She had chest pain chronically but may be getting a little worse. Denies dyspnea/nausea/vomiting. Has not been taking NTG. Denies palpitations.  3. GI Has appointment 12/10  4. Right chest wall pain Started several days ago Review of Systems Per HPI with inclusion of following: Denies wheezing but still coughing (non-productive)    Objective:   Physical Exam Gen: NAD, nicely dressed and groomed Psych: engaged, appropriate, pleasant CV: RRR, 2/6 murmur heard RUSB and LLSB   Left-sided chest pain not reproducible but right-sided chest pain easily reproducible with palpation Pulm: CTAB, no w/r/r Abd: soft, NT Ext: no edema    Assessment & Plan:

## 2011-11-20 NOTE — Patient Instructions (Signed)
If your lab results are normal, I will send you a letter with the results. If abnormal, someone at the clinic will get in touch with you.   For the left-sided chest pain, try the NTG. You may take it every 5 minutes up to 3 times. If the chest pain improves but then returns or worsens despite the NTG, go to the ED.  For the right-sided chest pain, try the Tylenol up to every 8 hours as needed. I think you muscles are sore from the coughing.   Follow-up in 3 months.

## 2011-11-21 ENCOUNTER — Encounter: Payer: Self-pay | Admitting: Family Medicine

## 2011-11-24 ENCOUNTER — Telehealth: Payer: Self-pay | Admitting: *Deleted

## 2011-11-24 ENCOUNTER — Encounter: Payer: Self-pay | Admitting: Internal Medicine

## 2011-11-24 ENCOUNTER — Ambulatory Visit (INDEPENDENT_AMBULATORY_CARE_PROVIDER_SITE_OTHER): Payer: PRIVATE HEALTH INSURANCE | Admitting: Internal Medicine

## 2011-11-24 VITALS — BP 142/90 | HR 88 | Ht 65.0 in | Wt 194.0 lb

## 2011-11-24 DIAGNOSIS — D509 Iron deficiency anemia, unspecified: Secondary | ICD-10-CM

## 2011-11-24 DIAGNOSIS — K5521 Angiodysplasia of colon with hemorrhage: Secondary | ICD-10-CM

## 2011-11-24 MED ORDER — SUCRALFATE 1 G PO TABS: 1.0000 g | ORAL_TABLET | Freq: Two times a day (BID) | ORAL | Status: DC

## 2011-11-24 NOTE — Patient Instructions (Addendum)
.  You have been scheduled for a blood transfusion. Please go to [facility] Short Stay on Thursday, 11/27/11 @ 2:30 pm for your type and cross. Your blood transfusion is scheduled for Friday, 11/28/11 @ 9:00 am. Please arrive at 8:30 am at [facility] Stay for registration. You have been scheduled for a small bowel capsule endoscopy which will be placed endoscopically at [facility] on 12/23/11. Please follow instructions given to you today. CC: Dr Verdie Drown, Dr Rayann Heman

## 2011-11-24 NOTE — Progress Notes (Signed)
[name] 03-06-1946 MRN [id number]   History of Present Illness:  This is a 65 year old African American female with AV malformations and chronic GI blood loss requiring blood transfusions and IV iron infusions. She was recently hospitalized for an upper GI bleed when her hemoglobin dropped to 6.2. It increased to 8.4 after transfusions of 2 units of blood. She had a prior hospitalization for an upper GI bleed when her hemoglobin dropped to 5.2. She was initially on Coumadin which was discontinued. The most recent upper GI bleed recurred within a few days of starting Xerelton, an antiplatelet agent she has been taking for atrial fibrillation and history of complete heart block. She is status post pacemaker placement. She failed a prior cardioversion. She is currently off all anticoagulants. Small bowel capsule endoscopies on 2 prior occasions failed to show good quality images. The first time, because the capsule didn't move out of the stomach. The second time because patient was not prepped well. Her last colonoscopy in August 2011 did not show any bleeding lesion. She had tubular adenomas on a colonoscopy in 2005. There is a family history of colon cancer in a direct relative. Her last upper endoscopy for placement of the capsule showed gastric AVM's. She has had no alcohol intake for 2 months.   Past Medical History  Diagnosis Date  . Angioedema     2/2 ACE  . Tobacco dependence     Quit January 2012; tried 1 cigarette March 2012, did not enjoy,  resolved to stay quit.   [name] RLS (restless legs syndrome)     Dx 06/2007  . Anemia   . Arteriovenous malformation of stomach   . Permanent atrial fibrillation   . Renal failure     baseline creatinine 1.6  . Hyperlipidemia   . Depression   . Diastolic heart failure   . Arthritis   . Sleep apnea   . Asthma   . Hx of colonic polyp 08/13/10    adenomatous  . Fatty liver 07/26/10  . Panic attacks   . Hypertension   . Complete heart  block     s/p PPM 1998  . Hypertrophic cardiomyopathy     dx by Dr [name] 2009  . Hx of colonoscopy   . GI bleed   . Panic attack   . DDD (degenerative disc disease)   . History of alcohol abuse Stopped Fall 2012  . History of tobacco use Quit Fall 2012   Past Surgical History  Procedure Date  . Tubal ligation 04/01/2000  . Pacemaker insertion [place], most recent gen change by [name] 4/12  . Cardiac catheterization     reports that she quit smoking about 3 months ago. Her smoking use included Cigarettes. She smoked .1 packs per day. She has never used smokeless tobacco. She reports that she drinks about 1.5 ounces of alcohol per week. She reports that she does not use illicit drugs. family history includes Alcohol abuse in her brother and father; Cancer in her brother; Colon cancer in her brother; Diabetes in her brother and sister; Heart attack in her brother and father; Heart disease in her mother and sister; Hypertension in her brother, father, mother, and sister; and Stroke in her father and mother. Allergies  Allergen Reactions  . Ace Inhibitors     REACTION: angioedema        Review of Systems: Denies dysphagia, odynophagia abdominal pain , positive for bloating and dyspepsia  The remainder of the  10 point ROS is negative except as outlined in H&P   Physical Exam: General appearance  Well developed, in no distress. Eyes- non icteric. HEENT nontraumatic, normocephalic. Mouth no lesions, tongue papillated, no cheilosis. Neck supple without adenopathy, thyroid not enlarged, no carotid bruits, no JVD. Lungs Clear to auscultation bilaterally. Cor normal S1, normal S2, regular rhythm, quiet precordium. Abdomen: Protuberant, nontender. Normoactive bowel sounds.  Rectal: Dark Hemoccult negative stool. Extremities no pedal edema. Skin no lesions. Neurological alert and oriented x 3. Psychological normal mood and affect.  Assessment and Plan:  Problem #1 Recurrent  anemia. Her hemoglobin dropped again in the last 4 weeks from 10 to 8.9 g. She is Hemoccult-negative today. Her anemia may be multifactorial. She will be set up for a blood transfusion of 2 units of packed cells. We will also arrange for a repeat small bowel capsule endoscopy this time with a more effective bowel prep. The capsule will have to be placed endoscopically. We will use propofol since patient had problems with sedation in the past. She will stay off anticoagulants. She will also start Carafate 1 g by mouth twice a day for gastritis-like symptoms.   11/24/2011 Delfin Edis

## 2011-11-24 NOTE — Telephone Encounter (Signed)
Informed pt of purpose of Capsule Endo, along with computer module that she will wear with associated leads attached to her abdomen. She was given a copy of prep instructions including instructions to stop iron on 12/16/11, a diet to follow with associated dates/times and where to go. Pt stated understanding. appt for EGD with Propofol for Capsule Placement Endoscopically on 12/23/11 at 09:30am at [facility]; booking # [id number] with Sharee Pimple. Also, reserved a Telemetry bed with Elmyra Ricks at [facility] for 12/23/11 d/t pt's pacemaker.

## 2011-11-27 ENCOUNTER — Encounter (HOSPITAL_COMMUNITY)
Admission: RE | Admit: 2011-11-27 | Discharge: 2011-11-27 | Disposition: A | Discharge status: Home / Self Care | Payer: PRIVATE HEALTH INSURANCE | Source: Ambulatory Visit | Attending: Internal Medicine | Admitting: Internal Medicine

## 2011-11-27 DIAGNOSIS — J45909 Unspecified asthma, uncomplicated: Secondary | ICD-10-CM | POA: Insufficient documentation

## 2011-11-27 DIAGNOSIS — D649 Anemia, unspecified: Secondary | ICD-10-CM | POA: Insufficient documentation

## 2011-11-28 ENCOUNTER — Telehealth: Payer: Self-pay | Admitting: *Deleted

## 2011-11-28 ENCOUNTER — Encounter (HOSPITAL_COMMUNITY)
Admission: RE | Admit: 2011-11-28 | Discharge: 2011-11-28 | Disposition: A | Discharge status: Home / Self Care | Payer: PRIVATE HEALTH INSURANCE | Source: Ambulatory Visit | Attending: Internal Medicine | Admitting: Internal Medicine

## 2011-11-28 LAB — PREPARE RBC (CROSSMATCH)

## 2011-11-28 MED ORDER — ALBUTEROL SULFATE (5 MG/ML) 0.5% IN NEBU: 2.5000 mg | INHALATION_SOLUTION | RESPIRATORY_TRACT | Status: AC

## 2011-11-28 MED ORDER — ALBUTEROL SULFATE (5 MG/ML) 0.5% IN NEBU
INHALATION_SOLUTION | RESPIRATORY_TRACT | Status: AC
  Filled 2011-11-28: qty 0.5

## 2011-11-28 MED ORDER — ALBUTEROL SULFATE (5 MG/ML) 0.5% IN NEBU
INHALATION_SOLUTION | RESPIRATORY_TRACT | Status: AC
  Filled 2011-11-28: qty 1

## 2011-11-28 NOTE — Telephone Encounter (Signed)
Patient is receiving a blood transfusion. She is on her second unit. She is wheezing and has a dry, cough. She has used her inhaler. Patient states she gets better if she has a breathing treatment. Per Dr. [name], May give patient a breathing treatment prn. Crystal notified of order.

## 2011-11-29 LAB — TYPE AND SCREEN
ABO/RH(D): O POS
Unit division: 0

## 2011-12-02 ENCOUNTER — Encounter: Payer: PRIVATE HEALTH INSURANCE | Admitting: Internal Medicine

## 2011-12-04 ENCOUNTER — Encounter: Payer: Self-pay | Admitting: Internal Medicine

## 2011-12-11 ENCOUNTER — Encounter: Payer: Self-pay | Admitting: Internal Medicine

## 2011-12-11 ENCOUNTER — Ambulatory Visit (INDEPENDENT_AMBULATORY_CARE_PROVIDER_SITE_OTHER): Payer: PRIVATE HEALTH INSURANCE | Admitting: Internal Medicine

## 2011-12-11 DIAGNOSIS — I5032 Chronic diastolic (congestive) heart failure: Secondary | ICD-10-CM

## 2011-12-11 DIAGNOSIS — I4891 Unspecified atrial fibrillation: Secondary | ICD-10-CM

## 2011-12-11 DIAGNOSIS — Z950 Presence of cardiac pacemaker: Secondary | ICD-10-CM

## 2011-12-11 DIAGNOSIS — I442 Atrioventricular block, complete: Secondary | ICD-10-CM

## 2011-12-11 DIAGNOSIS — I10 Essential (primary) hypertension: Secondary | ICD-10-CM

## 2011-12-11 DIAGNOSIS — R5381 Other malaise: Secondary | ICD-10-CM

## 2011-12-11 DIAGNOSIS — M79603 Pain in arm, unspecified: Secondary | ICD-10-CM

## 2011-12-11 DIAGNOSIS — I509 Heart failure, unspecified: Secondary | ICD-10-CM

## 2011-12-11 DIAGNOSIS — M79609 Pain in unspecified limb: Secondary | ICD-10-CM

## 2011-12-11 DIAGNOSIS — R5383 Other fatigue: Secondary | ICD-10-CM

## 2011-12-11 LAB — PACEMAKER DEVICE OBSERVATION
BATTERY VOLTAGE: 2.79 V
BMOD-0002RV: 10
RV LEAD IMPEDENCE PM: 414 Ohm
RV LEAD THRESHOLD: 1 V
VENTRICULAR PACING PM: 99

## 2011-12-11 NOTE — Assessment & Plan Note (Signed)
Improved Check BMET today

## 2011-12-11 NOTE — Assessment & Plan Note (Signed)
Stable No change required today  

## 2011-12-11 NOTE — Assessment & Plan Note (Signed)
Normal pacemaker function See Pace Art report No changes today  

## 2011-12-11 NOTE — Progress Notes (Signed)
The patient presents today for routine electrophysiology followup.  Since last being seen in our clinic, the patient has done reasonably well.  Her SOB has significantly improved and her edema has resolved. She has an ongoing GI evaluation by Dr [name] and xarelto has been discontinued.  Her concern today is with significant muscular pain in both arms.  This has been present for several weeks.  She reports bilateral arm and shoulder pain, worse with arm movement.  She reports associated muscular weakness and is having trouble fixing her hair.   Today, she denies symptoms of chest pain, shortness of breath, orthopnea, PND,presyncope, syncope, or neurologic sequela.  The patient feels that she is otherwise without complaint today.   Past Medical History  Diagnosis Date  . Angioedema     2/2 ACE  . Tobacco dependence     Quit January 2012; tried 1 cigarette March 2012, did not enjoy,  resolved to stay quit.   [name] RLS (restless legs syndrome)     Dx 06/2007  . Anemia   . Arteriovenous malformation of stomach   . Permanent atrial fibrillation   . Renal failure     baseline creatinine 1.6  . Hyperlipidemia   . Depression   . Diastolic heart failure   . Arthritis   . Sleep apnea   . Asthma   . Hx of colonic polyp 08/13/10    adenomatous  . Fatty liver 07/26/10  . Panic attacks   . Hypertension   . Complete heart block     s/p PPM 1998  . Hypertrophic cardiomyopathy     dx by Dr [name] 2009  . Hx of colonoscopy   . GI bleed   . Panic attack   . DDD (degenerative disc disease)   . History of alcohol abuse Stopped Fall 2012  . History of tobacco use Quit Fall 2012   Past Surgical History  Procedure Date  . Tubal ligation 04/01/2000  . Pacemaker insertion [place], most recent gen change by [name] 4/12  . Cardiac catheterization     Current Outpatient Prescriptions  Medication Sig Dispense Refill  . albuterol (PROAIR HFA) 108 (90 BASE) MCG/ACT inhaler two puffs every 4-6 hours as  needed       . ALPRAZolam (XANAX) 0.5 MG tablet Take 1 tablet (0.5 mg total) by mouth at bedtime as needed. For anxiety  30 tablet  2  . carvedilol (COREG) 6.25 MG tablet Take 1 tablet (6.25 mg total) by mouth 2 (two) times daily with a meal.  60 tablet  6  . dextromethorphan-guaiFENesin (MUCINEX DM) 30-600 MG per 12 hr tablet Take 1 tablet by mouth every 12 (twelve) hours.        . ferrous sulfate 325 (65 FE) MG tablet Take 1 tablet (325 mg total) by mouth 2 (two) times daily with a meal.  60 tablet  3  . furosemide (LASIX) 40 MG tablet Take 1 tablet (40 mg total) by mouth daily.  30 tablet  2  . Multiple Vitamins-Minerals (CENTRUM) tablet Take 1 tablet by mouth daily.        . Olopatadine HCl (PATADAY) 0.2 % SOLN Apply 1 drop to eye daily.  1 Bottle  5  . omeprazole (PRILOSEC) 20 MG capsule Take 1 capsule (20 mg total) by mouth 2 (two) times daily.  60 capsule  3  . Probiotic Product (SUPER PROBIOTIC PO) Take 1 capsule by mouth daily.        [name]  sucralfate (CARAFATE) 1 G tablet Take 1 tablet (1 g total) by mouth 2 (two) times daily.  60 tablet  1   Current Facility-Administered Medications  Medication Dose Route Frequency Provider Last Rate Last Dose  . cyanocobalamin ((VITAMIN B-12)) injection 1,000 mcg  1,000 mcg Intramuscular Q30 days [name], MD   1,000 mcg at 09/16/11 1341    Allergies  Allergen Reactions  . Ace Inhibitors     REACTION: angioedema    History   Social History  . Marital Status: Divorced    Spouse Name: N/A    Number of Children: 32  . Years of Education: N/A   Occupational History  . disabled    Social History Main Topics  . Smoking status: Former Smoker -- 0.1 packs/day    Types: Cigarettes    Quit date: 08/16/2011  . Smokeless tobacco: Never Used   Comment: Made decision to quit after hospitalization. Relapsed a few days ago and had one "drag" but found it unpleasant. Has not smoked since then.   . Alcohol Use: 1.5 oz/week    3 drink(s) per week    . Drug Use: No  . Sexually Active: Not on file   Other Topics Concern  . Not on file   Social History Narrative   On disability for heart failure/pacemaker.  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 4 grown children,  8 grandchildren.  Drinks alcohol a few times a week socially. At one time, the most she reports drinking is about 3 mixed drinks.Lives with daughter [name] and son. Will be moving 04/2011 to different house because concerned children are taking advantage of her financial situation. No illicit drugs.Smokes few cigarettes daily, especially when stressed. Started smoking @ age 59. Quit January 2012 2/2 health but started smoking again 02/2011.     Family History  Problem Relation Age of Onset  . Hypertension Mother   . Stroke Mother   . Hypertension Father   . Stroke Father   . Hypertension Sister   . Hypertension Brother   . Colon cancer Brother   . Diabetes Sister   . Diabetes Brother   . Cancer Brother   . Heart attack Brother   . Heart attack Father   . Heart disease Mother   . Heart disease Sister   . Alcohol abuse Brother   . Alcohol abuse Father     ROS-  All systems are reviewed and are negative except as outlined in the HPI above    Physical Exam: Filed Vitals:   12/11/11 1503  BP: 142/94  Pulse: 70  Height: 5\' 5"  (1.651 m)  Weight: 191 lb (86.637 kg)    GEN- The patient is well appearing, alert and oriented x 3 today.   Head- normocephalic, atraumatic Eyes-  Sclera clear, conjunctiva pink Ears- hearing intact Oropharynx- clear Neck- supple, no JVP Lymph- no cervical lymphadenopathy Lungs- Clear to ausculation bilaterally, normal work of breathing Chest- pacemaker pocket is well healed Heart- Regular rate and rhythm (paced) GI- soft, NT, ND, + BS Extremities- no clubbing, cyanosis, or edema MS- no significant deformity or atrophy Skin- no rash or lesion Psych- euthymic mood, full affect Neuro- strength and sensation are  intact  Pacemaker interrogation- reviewed in detail today,  See PACEART report  Assessment and Plan:

## 2011-12-11 NOTE — Assessment & Plan Note (Signed)
She has muscular pain on both arms for several weeks with weakness. I am concerned that this could be myositis and possibly related to crestor. I will check ESR and CK today.  I have stopped crestor.  If symptoms do not resolve, she should follow-up with her PCP for more aggressive workup.

## 2011-12-11 NOTE — Patient Instructions (Addendum)
Your physician recommends that you schedule a follow-up appointment in: 6 weeks   Your physician wants you to follow-up in: 6 months with device clinic You will receive a reminder letter in the mail two months in advance. If you don't receive a letter, please call our office to schedule the follow-up appointment.  Your physician has recommended you make the following change in your medication:  1) Stop Crestor  Your physician recommends that you return for lab work today

## 2011-12-11 NOTE — Assessment & Plan Note (Signed)
Permanent rate controlled afib Not presently a candidate for anticoagulation due to prior GI bleeding (workup ongoing by Dr [name])

## 2011-12-12 LAB — SEDIMENTATION RATE: Sed Rate: 33 mm/hr — ABNORMAL HIGH (ref 0–22)

## 2011-12-12 LAB — BASIC METABOLIC PANEL
CO2: 30 mEq/L (ref 19–32)
Calcium: 9.8 mg/dL (ref 8.4–10.5)
Creatinine, Ser: 1 mg/dL (ref 0.4–1.2)
GFR: 73.46 mL/min (ref >60.00)

## 2011-12-12 LAB — CK: Total CK: 61 U/L (ref 7–177)

## 2011-12-12 LAB — HEPATIC FUNCTION PANEL
Bilirubin, Direct: 0.1 mg/dL (ref 0.0–0.3)
Total Protein: 6.8 g/dL (ref 6.0–8.3)

## 2011-12-17 ENCOUNTER — Telehealth: Payer: Self-pay | Admitting: *Deleted

## 2011-12-17 MED ORDER — POTASSIUM CHLORIDE ER 10 MEQ PO TBCR: 20.0000 meq | EXTENDED_RELEASE_TABLET | Freq: Every day | ORAL | Status: DC

## 2011-12-17 NOTE — Telephone Encounter (Signed)
lmom for patient with the need to start Potassium based on lab results

## 2011-12-18 ENCOUNTER — Telehealth: Payer: Self-pay | Admitting: *Deleted

## 2011-12-18 NOTE — Telephone Encounter (Signed)
Per Sharee Pimple at [place] may move patient procedure on 12/23/11 to 9:00 AM with 7:30 AM arrival. Patient aware of this time change.

## 2011-12-23 ENCOUNTER — Encounter (HOSPITAL_COMMUNITY)
Admission: RE | Disposition: A | Discharge status: Home / Self Care | Payer: Self-pay | Source: Ambulatory Visit | Attending: Internal Medicine

## 2011-12-23 ENCOUNTER — Encounter (HOSPITAL_COMMUNITY): Payer: Self-pay | Admitting: *Deleted

## 2011-12-23 ENCOUNTER — Ambulatory Visit (HOSPITAL_COMMUNITY)
Admission: RE | Admit: 2011-12-23 | Discharge: 2011-12-23 | Disposition: A | Discharge status: Home / Self Care | Payer: PRIVATE HEALTH INSURANCE | Source: Ambulatory Visit | Attending: Internal Medicine | Admitting: Internal Medicine

## 2011-12-23 ENCOUNTER — Ambulatory Visit (HOSPITAL_COMMUNITY): Payer: PRIVATE HEALTH INSURANCE | Admitting: *Deleted

## 2011-12-23 DIAGNOSIS — D649 Anemia, unspecified: Secondary | ICD-10-CM

## 2011-12-23 DIAGNOSIS — K31819 Angiodysplasia of stomach and duodenum without bleeding: Secondary | ICD-10-CM | POA: Insufficient documentation

## 2011-12-23 DIAGNOSIS — K922 Gastrointestinal hemorrhage, unspecified: Secondary | ICD-10-CM

## 2011-12-23 HISTORY — DX: Encounter for other specified aftercare: Z51.89

## 2011-12-23 HISTORY — DX: Presence of cardiac pacemaker: Z95.0

## 2011-12-23 HISTORY — PX: ESOPHAGOGASTRODUODENOSCOPY: SHX5428

## 2011-12-23 HISTORY — DX: Gastro-esophageal reflux disease without esophagitis: K21.9

## 2011-12-23 HISTORY — DX: Heart failure, unspecified: I50.9

## 2011-12-23 HISTORY — DX: Reserved for inherently not codable concepts without codable children: IMO0001

## 2011-12-23 HISTORY — DX: Angina pectoris, unspecified: I20.9

## 2011-12-23 HISTORY — DX: Shortness of breath: R06.02

## 2011-12-23 HISTORY — PX: GIVENS CAPSULE STUDY: SHX5432

## 2011-12-23 HISTORY — DX: Acute myocardial infarction, unspecified: I21.9

## 2011-12-23 LAB — HEMOGLOBIN AND HEMATOCRIT, BLOOD: HCT: 31.5 % — ABNORMAL LOW (ref 36.0–46.0)

## 2011-12-23 SURGERY — EGD (ESOPHAGOGASTRODUODENOSCOPY)
Anesthesia: Monitor Anesthesia Care

## 2011-12-23 MED ORDER — KETAMINE HCL 50 MG/ML IJ SOLN
INTRAMUSCULAR | Status: DC | PRN
  Administered 2011-12-23: 25 mg via INTRAMUSCULAR

## 2011-12-23 MED ORDER — LABETALOL HCL 5 MG/ML IV SOLN
INTRAVENOUS | Status: DC | PRN
  Administered 2011-12-23: 5 mg via INTRAVENOUS

## 2011-12-23 MED ORDER — LACTATED RINGERS IV SOLN
INTRAVENOUS | Status: DC
  Administered 2011-12-23: 1000 mL via INTRAVENOUS

## 2011-12-23 MED ORDER — FENTANYL CITRATE 0.05 MG/ML IJ SOLN
INTRAMUSCULAR | Status: DC | PRN
  Administered 2011-12-23 (×2): 50 ug via INTRAVENOUS

## 2011-12-23 MED ORDER — PROPOFOL 10 MG/ML IV EMUL
INTRAVENOUS | Status: DC | PRN
  Administered 2011-12-23: 100 ug/kg/min via INTRAVENOUS

## 2011-12-23 MED ORDER — MIDAZOLAM HCL 5 MG/5ML IJ SOLN
INTRAMUSCULAR | Status: DC | PRN
  Administered 2011-12-23: 2.5 mg via INTRAVENOUS

## 2011-12-23 MED ORDER — LACTATED RINGERS IV SOLN
INTRAVENOUS | Status: DC | PRN
  Administered 2011-12-23: 10:00:00 via INTRAVENOUS

## 2011-12-23 MED ORDER — ALBUTEROL SULFATE (5 MG/ML) 0.5% IN NEBU
INHALATION_SOLUTION | RESPIRATORY_TRACT | Status: AC
  Filled 2011-12-23: qty 0.5

## 2011-12-23 MED ORDER — ALBUTEROL SULFATE (5 MG/ML) 0.5% IN NEBU
2.5000 mg | INHALATION_SOLUTION | Freq: Once | RESPIRATORY_TRACT | Status: AC
  Administered 2011-12-23: 2.5 mg via RESPIRATORY_TRACT

## 2011-12-23 NOTE — H&P (Signed)
Please see H&P from [place] 12/2011

## 2011-12-23 NOTE — Op Note (Signed)
[facility] [place], [place]  [id number]  ENDOSCOPY PROCEDURE REPORT  PATIENT:  [name], [name]  MR#:  [id number] BIRTHDATE:  [birth year], 65 yrs. old  GENDER:  female  ENDOSCOPIST:  Lowella Bandy. [name], MD Referred by:  Nelda Severe Allred, M.D.  PROCEDURE DATE:  12/23/2011 PROCEDURE:  EGD, diagnostic 43235 ASA CLASS:  Class III INDICATIONS:  placement of Givens capsule, to evaluate GIB, hx avm's  MEDICATIONS:   MAC sedation, administered by CRNA, propofol (Diprivan) TOPICAL ANESTHETIC:  none  DESCRIPTION OF PROCEDURE:   After the risks benefits and alternatives of the procedure were thoroughly explained, informed consent was obtained.  The Pentax Gastroscope [id number] endoscope was introduced through the mouth and advanced to the second portion of the duodenum, without limitations.  The instrument was slowly withdrawn as the mucosa was fully examined. <<PROCEDUREIMAGES>>  An a.v. malformation was found in the body of the stomach (see image3 and image4). mucosal hemorrhage vs small avm in gastric body placement of Givens capsule into the duodenum  Otherwise the examination was normal (see image2 and image1).    Retroflexed views revealed no abnormalities.    The scope was then withdrawn from the patient and the procedure completed.  COMPLICATIONS:  None  ENDOSCOPIC IMPRESSION: 1) Av malformation in the body of the stomach 2) Otherwise normal examination placement of a Givens capsule in the duodenum RECOMMENDATIONS: await Givens results  REPEAT EXAM:  In 0 year(s) for.  ______________________________ Lowella Bandy. [name], MD  CC:  n. eSIGNED:   Lowella Bandy. [name] at 12/23/2011 10:27 AM  Rudean Curt, [id number]

## 2011-12-23 NOTE — Transfer of Care (Signed)
Immediate Anesthesia Transfer of Care Note  Patient: [name]  Procedure(s) Performed:  ESOPHAGOGASTRODUODENOSCOPY (EGD); GIVENS CAPSULE STUDY  Patient Location: PACU  Anesthesia Type: MAC  Level of Consciousness: awake, alert , patient cooperative and responds to stimulation  Airway & Oxygen Therapy: Patient Spontanous Breathing and Patient connected to nasal cannula oxygen  Post-op Assessment: Report given to PACU RN, Post -op Vital signs reviewed and stable and Patient moving all extremities  Post vital signs: reviewed and stable  Complications: No apparent anesthesia complications

## 2011-12-23 NOTE — Anesthesia Postprocedure Evaluation (Signed)
  Anesthesia Post-op Note  Patient: [name]  Procedure(s) Performed:  ESOPHAGOGASTRODUODENOSCOPY (EGD); GIVENS CAPSULE STUDY  Patient Location: PACU  Anesthesia Type: MAC  Level of Consciousness: awake and alert   Airway and Oxygen Therapy: Patient Spontanous Breathing  Post-op Pain: mild  Post-op Assessment: Post-op Vital signs reviewed, Patient's Cardiovascular Status Stable, Respiratory Function Stable, Patent Airway and No signs of Nausea or vomiting  Post-op Vital Signs: stable  Complications: No apparent anesthesia complications

## 2011-12-23 NOTE — Preoperative (Signed)
Beta Blockers   Reason not to administer Beta Blockers:Not Applicable pt took Coreg at 1100 12-22-11

## 2011-12-23 NOTE — Anesthesia Preprocedure Evaluation (Signed)
Anesthesia Evaluation  Patient identified by MRN, date of birth, ID band Patient awake    Reviewed: Allergy & Precautions, H&P , NPO status , Patient's Chart, lab work & pertinent test results  Airway Mallampati: II TM Distance: >3 FB Neck ROM: Full    Dental No notable dental hx.    Pulmonary neg pulmonary ROS, asthma , sleep apnea , COPD COPD inhaler,  clear to auscultation  Pulmonary exam normal       Cardiovascular hypertension, Pt. on home beta blockers neg cardio ROS + dysrhythmias Regular Normal    Neuro/Psych PSYCHIATRIC DISORDERS Anxiety Depression Negative Neurological ROS  Negative Psych ROS   GI/Hepatic negative GI ROS, Neg liver ROS, GERD-  Medicated,  Endo/Other  Negative Endocrine ROS  Renal/GU negative Renal ROS  Genitourinary negative   Musculoskeletal negative musculoskeletal ROS (+)   Abdominal (+) obese,   Peds negative pediatric ROS (+)  Hematology negative hematology ROS (+)   Anesthesia Other Findings   Reproductive/Obstetrics negative OB ROS                           Anesthesia Physical Anesthesia Plan  ASA: III  Anesthesia Plan: MAC   Post-op Pain Management:    Induction: Intravenous  Airway Management Planned:   Additional Equipment:   Intra-op Plan:   Post-operative Plan:   Informed Consent: I have reviewed the patients History and Physical, chart, labs and discussed the procedure including the risks, benefits and alternatives for the proposed anesthesia with the patient or authorized representative who has indicated his/her understanding and acceptance.   Dental advisory given  Plan Discussed with: CRNA  Anesthesia Plan Comments:         Anesthesia Quick Evaluation

## 2011-12-24 ENCOUNTER — Encounter (HOSPITAL_COMMUNITY): Payer: Self-pay | Admitting: Internal Medicine

## 2011-12-26 ENCOUNTER — Other Ambulatory Visit: Payer: Self-pay | Admitting: Family Medicine

## 2011-12-26 NOTE — Telephone Encounter (Signed)
Refill request

## 2011-12-29 ENCOUNTER — Other Ambulatory Visit: Payer: Self-pay

## 2011-12-29 ENCOUNTER — Encounter: Payer: Self-pay | Admitting: Internal Medicine

## 2011-12-31 MED ORDER — ALBUTEROL SULFATE HFA 108 (90 BASE) MCG/ACT IN AERS: 2.0000 | INHALATION_SPRAY | RESPIRATORY_TRACT | Status: DC | PRN

## 2011-12-31 NOTE — Telephone Encounter (Signed)
Forward to PCP for refill

## 2012-01-08 ENCOUNTER — Encounter: Payer: Self-pay | Admitting: Family Medicine

## 2012-01-08 ENCOUNTER — Ambulatory Visit (INDEPENDENT_AMBULATORY_CARE_PROVIDER_SITE_OTHER): Payer: PRIVATE HEALTH INSURANCE | Admitting: Family Medicine

## 2012-01-08 VITALS — BP 118/79 | HR 83 | Temp 98.2°F | Ht 65.0 in | Wt 189.0 lb

## 2012-01-08 DIAGNOSIS — Z23 Encounter for immunization: Secondary | ICD-10-CM

## 2012-01-08 DIAGNOSIS — M79609 Pain in unspecified limb: Secondary | ICD-10-CM

## 2012-01-08 DIAGNOSIS — M79601 Pain in right arm: Secondary | ICD-10-CM

## 2012-01-08 HISTORY — DX: Pain in right arm: M79.601

## 2012-01-08 MED ORDER — TRAMADOL HCL 50 MG PO TABS
50.0000 mg | ORAL_TABLET | Freq: Three times a day (TID) | ORAL | Status: AC | PRN
Start: 2012-01-08 — End: 2012-01-18

## 2012-01-08 NOTE — Patient Instructions (Addendum)
I think you may have a rotator cuff injury.  Take the Tramadol up to every 8 hours as needed for pain.  Please do the stretching exercises. It is important that you keep your shoulders and arms flexible. Return to clinic in 2 weeks.   Schedule a physical exam appointment at your convenience as well for a Pap smear.  Your mammogram is not due until May 2013.   It was nice to see you today.   Rotator Cuff Injury The rotator cuff is the collective set of muscles and tendons that make up the stabilizing unit of your shoulder. This unit holds in the ball of the humerus (upper arm bone) in the socket of the scapula (shoulder blade). Injuries to this stabilizing unit most commonly come from sports or activities that cause the arm to be moved repeatedly over the head. Examples of this include throwing, weight lifting, swimming, racquet sports, or an injury such as falling on your arm. Chronic (longstanding) irritation of this unit can cause inflammation (soreness), bursitis, and eventual damage to the tendons to the point of rupture (tear). An acute (sudden) injury of the rotator cuff can result in a partial or complete tear. You may need surgery with complete tears. Small or partial rotator cuff tears may be treated conservatively with temporary immobilization, exercises and rest. Physical therapy may be needed. HOME CARE INSTRUCTIONS   Apply ice to the injury for 15 to 20 minutes 3 to 4 times per day for the first 2 days. Put the ice in a plastic bag and place a towel between the bag of ice and your skin.   If you have a shoulder immobilizer (sling and straps), do not remove it for as long as directed by your caregiver or until you see a caregiver for a follow-up examination. If you need to remove it, move your arm as little as possible.   You may want to sleep on several pillows or in a recliner at night to lessen swelling and pain.   Only take over-the-counter or prescription medicines for pain,  discomfort, or fever as directed by your caregiver.   Do simple hand squeezing exercises with a soft rubber ball to decrease hand swelling.  SEEK MEDICAL CARE IF:   Pain in your shoulder increases or new pain or numbness develops in your arm, hand, or fingers.   Your hand or fingers are colder than your other hand.  SEEK IMMEDIATE MEDICAL CARE IF:   Your arm, hand, or fingers are numb or tingling.   Your arm, hand, or fingers are increasingly swollen and painful, or turn white or blue.  Document Released: 11/28/2000 Document Revised: 08/13/2011 Document Reviewed: 11/21/2008 [facility] Patient Information 2012 [place].

## 2012-01-08 NOTE — Progress Notes (Signed)
  Subjective:    Patient ID: [name], female    DOB: [birth year], 66 y.o.   MRN: [id number]  HPI Upper arm pain R>L Description: aching, constant 8/10 pain. Occurs at rest and with activity. Not worse at particular time of day. Pain starts in her right but sometimes radiates to the left. Denies back pain. Duration: 2 months Progression: unchanged Inciting event: She thinks it started after falling on her right arm.  Alleviated by: nothing. Tried Icy-Hot, Tylenol (1 pill a day). Exacerbated by: nothing. ROS: occasional tingling down both arms to her finger tips but denies numbness  Cardiologist stopped her Crestor recently to see if this could be causing this but patient reports to change in her symptoms. CK normal and ESR mildly elevated 33 (0-22 normal range).  Review of Systems Per HPI.    Objective:   Physical Exam Gen: NAD Psych: pleasant, engaged, appropriate MSK:    Palpation: pain with moderate/deep palpation of musculature of right upper arm   ROM:      Abduction: pain past 120 deg on right and 160 on left     Flexion: pain past 120 deg on right and 160 on left     Extension: intact     Internal rotation: positive Hawkins-Kennedy Neuro: sensation intact CV: 2+ radial pulses Upper ext: warm    Assessment & Plan:

## 2012-01-08 NOTE — Assessment & Plan Note (Signed)
Appears to be rotator cuff tendinopathy. Patient offered steroid injection today but declined. Would like to try oral pain medications instead. Rx for Tramadol. Avoiding NSAIDs due to chronic problem of GI bleed. Shown shoulder exercises to prevent frozen shoulder. Follow-up in 2 weeks. Consider steroid injection and/or PT-referral at that time if pain not well controlled. Consider Sports Medicine referral at that time as well.

## 2012-01-12 ENCOUNTER — Telehealth: Payer: Self-pay | Admitting: Internal Medicine

## 2012-01-12 ENCOUNTER — Ambulatory Visit: Payer: PRIVATE HEALTH INSURANCE | Admitting: Home Health Services

## 2012-01-12 DIAGNOSIS — D649 Anemia, unspecified: Secondary | ICD-10-CM

## 2012-01-12 NOTE — Telephone Encounter (Signed)
Patient calling for results of capsule endoscopy. Please, advise.

## 2012-01-13 NOTE — Telephone Encounter (Signed)
The capsule endoscopy shows no avm's in the small intestine. The only ones she has are the ones we saw on the endoscopy. Please have blood count checked in 2 weeks ( mid February). Please make follow up appointment in 6-8 weeks.

## 2012-01-14 ENCOUNTER — Encounter: Payer: Self-pay | Admitting: *Deleted

## 2012-01-14 NOTE — Telephone Encounter (Signed)
Spoke with patient and gave her results as per Dr. [name] and recommendations. Lab in EPIC for 01/28/12 and note to remind patient. Scheduled OV on 02/25/12 10:45/11:00 AM with Dr. [name]. Letter mailed to patient.

## 2012-01-21 ENCOUNTER — Encounter: Payer: Self-pay | Admitting: Physician Assistant

## 2012-01-21 ENCOUNTER — Ambulatory Visit (INDEPENDENT_AMBULATORY_CARE_PROVIDER_SITE_OTHER): Payer: PRIVATE HEALTH INSURANCE | Admitting: Physician Assistant

## 2012-01-21 ENCOUNTER — Ambulatory Visit: Payer: PRIVATE HEALTH INSURANCE | Admitting: Physician Assistant

## 2012-01-21 DIAGNOSIS — E785 Hyperlipidemia, unspecified: Secondary | ICD-10-CM

## 2012-01-21 DIAGNOSIS — K922 Gastrointestinal hemorrhage, unspecified: Secondary | ICD-10-CM

## 2012-01-21 DIAGNOSIS — I509 Heart failure, unspecified: Secondary | ICD-10-CM

## 2012-01-21 DIAGNOSIS — M79609 Pain in unspecified limb: Secondary | ICD-10-CM

## 2012-01-21 DIAGNOSIS — M79601 Pain in right arm: Secondary | ICD-10-CM

## 2012-01-21 DIAGNOSIS — I10 Essential (primary) hypertension: Secondary | ICD-10-CM

## 2012-01-21 DIAGNOSIS — I5032 Chronic diastolic (congestive) heart failure: Secondary | ICD-10-CM

## 2012-01-21 DIAGNOSIS — R079 Chest pain, unspecified: Secondary | ICD-10-CM

## 2012-01-21 DIAGNOSIS — I4891 Unspecified atrial fibrillation: Secondary | ICD-10-CM

## 2012-01-21 LAB — BASIC METABOLIC PANEL
CO2: 27 mEq/L (ref 19–32)
Calcium: 9.9 mg/dL (ref 8.4–10.5)
Creatinine, Ser: 1.3 mg/dL — ABNORMAL HIGH (ref 0.4–1.2)
GFR: 53.81 mL/min — ABNORMAL LOW (ref >60.00)

## 2012-01-21 MED ORDER — ROSUVASTATIN CALCIUM 20 MG PO TABS: 20.0000 mg | ORAL_TABLET | Freq: Every day | ORAL | Status: DC

## 2012-01-21 NOTE — Assessment & Plan Note (Signed)
Atypical.  She had a normal heart catheterization in 2009.  No further workup at this time.

## 2012-01-21 NOTE — Assessment & Plan Note (Signed)
Ongoing workup with gastroenterology.  She recently had a capsule endoscopy.  Results are pending.

## 2012-01-21 NOTE — Assessment & Plan Note (Signed)
Rate controlled.  She is off of anticoagulation therapy due to history of GI bleeding.  Restart anticoagulation therapy in the future if cleared by gastroenterology.

## 2012-01-21 NOTE — Assessment & Plan Note (Signed)
Not related to statin therapy.  She can restart her Crestor.  Followup with PCP as directed.

## 2012-01-21 NOTE — Progress Notes (Signed)
[address] [place], [place]  [address] Phone: [phone] Fax:  [phone]  Date:  01/21/2012   Name:  [name]       DOB:  04-Dec-1946 MRN:  [id number]  PCP:  Dr. Verdie Drown Primary Cardiologist:  Dr. Thompson Grayer  Primary Electrophysiologist:  Dr. Thompson Grayer    History of Present Illness: [name] is a 66 y.o. female who presents for follow up.  She has a h/o hypertrophic cardiomyopathy, diastolic CHF, complete heart block, s/p pacemaker, atrial fibrillation.  She had a normal heart cath in 5/09.  Last echo 9/12: severe LVH, EF 55-60%, mild LAE, PASP 46.  She has been admitted for GI bleeding in the past due to gastric AVMs.  She is off anticoagulation now and is followed by Dr. Delfin Edis.  Last seen by Dr. Thompson Grayer in 12/12.  She c/o arm pain and Crestor was stopped.  Labs were checked: ESR 33, CPK 61, ALT 11, K 3.2, creatinine 1.0.  K+ was to be adjusted and she still needs a follow up bmet.    She continued to have arm pain.  Saw her PCP.  Suspected to have rotator cuff tendonopathy.   May need sports med to see her for injection.  Patient notes occ atypical left sided chest pain.  Occurs at rest and lasts just seconds.  Has had for years without change.  Has chronic DOE.  No true orthopnea or PND.  No significant edema.  No syncope.  Just had capsule endoscopy.  Has not heard results.    Past Medical History  Diagnosis Date  . Angioedema     2/2 ACE  . Tobacco dependence     Quit January 2012; tried 1 cigarette March 2012, did not enjoy,  resolved to stay quit.   [name] RLS (restless legs syndrome)     Dx 06/2007  . Anemia   . Arteriovenous malformation of stomach   . Permanent atrial fibrillation   . Renal failure     baseline creatinine 1.6  . Hyperlipidemia   . Depression   . Diastolic heart failure   . Arthritis   . Sleep apnea   . Asthma   . Hx of colonic polyp 08/13/10    adenomatous  . Fatty liver 07/26/10  .  Panic attacks   . Hypertension   . Complete heart block     s/p PPM 1998  . Hypertrophic cardiomyopathy     dx by Dr [name] 2009  . Hx of colonoscopy   . GI bleed   . Panic attack   . DDD (degenerative disc disease)   . History of alcohol abuse Stopped Fall 2012  . History of tobacco use Quit Fall 2012  . Shortness of breath     sob on exertation  . CHF (congestive heart failure)   . Angina   . Myocardial infarction   . CHF (congestive heart failure)   . Blood transfusion   . GERD (gastroesophageal reflux disease)   . Cough   . Pacemaker     Current Outpatient Prescriptions  Medication Sig Dispense Refill  . acetaminophen (TYLENOL) 325 MG tablet Take 650 mg by mouth as needed.      [name] albuterol (PROAIR HFA) 108 (90 BASE) MCG/ACT inhaler Inhale 2 puffs into the lungs every 4 (four) hours as needed for wheezing or shortness of breath. two puffs every 4-6 hours as needed  1 Inhaler  2  . ALPRAZolam Duanne Moron)  0.5 MG tablet Take 1 tablet (0.5 mg total) by mouth at bedtime as needed. For anxiety  30 tablet  2  . carvedilol (COREG) 6.25 MG tablet Take 1 tablet (6.25 mg total) by mouth 2 (two) times daily with a meal.  60 tablet  6  . Coenzyme Q10 10 MG capsule Take 10 mg by mouth daily.      [name] dextromethorphan-guaiFENesin (MUCINEX DM) 30-600 MG per 12 hr tablet Take 1 tablet by mouth every 12 (twelve) hours.        . ferrous sulfate 325 (65 FE) MG tablet TAKE 1 TABLET BY MOUTH TWICE DAILY WITH MEALS. TAKE TO HELP KEEP YOUR BLOOD COUNTS UP.  60 tablet  0  . furosemide (LASIX) 40 MG tablet Take 1 tablet (40 mg total) by mouth daily.  30 tablet  2  . Multiple Vitamins-Minerals (CENTRUM) tablet Take 1 tablet by mouth daily.        . nitroGLYCERIN (NITROSTAT) 0.4 MG SL tablet Place 0.4 mg under the tongue every 5 (five) minutes as needed.      . Olopatadine HCl (PATADAY) 0.2 % SOLN Apply 1 drop to eye daily.  1 Bottle  5  . omeprazole (PRILOSEC) 20 MG capsule Take 1 capsule (20 mg total) by  mouth 2 (two) times daily.  60 capsule  3  . potassium chloride (K-DUR) 10 MEQ tablet Take 2 tablets (20 mEq total) by mouth daily.  30 tablet  11  . Probiotic Product (SUPER PROBIOTIC PO) Take 1 capsule by mouth daily.        [name] senna (SENOKOT) 8.6 MG TABS Take 1 tablet by mouth at bedtime as needed.      . sucralfate (CARAFATE) 1 G tablet Take 1 tablet (1 g total) by mouth 2 (two) times daily.  60 tablet  1  . traMADol (ULTRAM) 50 MG tablet Take 50 mg by mouth every 8 (eight) hours as needed.       Current Facility-Administered Medications  Medication Dose Route Frequency Provider Last Rate Last Dose  . cyanocobalamin ((VITAMIN B-12)) injection 1,000 mcg  1,000 mcg Intramuscular Q30 days [name], MD   1,000 mcg at 09/16/11 1341    Allergies: Allergies  Allergen Reactions  . Ace Inhibitors     REACTION: angioedema    History  Substance Use Topics  . Smoking status: Former Smoker -- 0.1 packs/day    Types: Cigarettes    Quit date: 08/16/2011  . Smokeless tobacco: Never Used   Comment: Made decision to quit after hospitalization. Relapsed a few days ago and had one "drag" but found it unpleasant. Has not smoked since then.   . Alcohol Use: No     PHYSICAL EXAM: VS:  BP 122/82  Pulse 80  Ht 5\' 5"  (1.651 m)  Wt 191 lb (86.637 kg)  BMI 31.78 kg/m2 Well nourished, well developed, in no acute distress HEENT: normal Neck: no JVD Cardiac:  normal S1, S2; RRR; no murmur Lungs:  clear to auscultation bilaterally, no wheezing, rhonchi or rales Abd: soft, nontender, no hepatomegaly Ext: no edema Skin: warm and dry Neuro:  CNs 2-12 intact, no focal abnormalities noted  ASSESSMENT AND PLAN:

## 2012-01-21 NOTE — Assessment & Plan Note (Signed)
Controlled.  

## 2012-01-21 NOTE — Assessment & Plan Note (Signed)
Volume stable.  She needs a repeat basic metabolic panel today to followup on her potassium.

## 2012-01-21 NOTE — Patient Instructions (Signed)
Your physician has recommended you make the following change in your medication:  restart Crestor  Your physician recommends that you have lab work drawn today  bmp  Keep follow-up appt with Dr. Rayann Heman as scheduled

## 2012-01-23 ENCOUNTER — Encounter: Payer: Self-pay | Admitting: Family Medicine

## 2012-01-23 ENCOUNTER — Ambulatory Visit (INDEPENDENT_AMBULATORY_CARE_PROVIDER_SITE_OTHER): Payer: PRIVATE HEALTH INSURANCE | Admitting: Family Medicine

## 2012-01-23 VITALS — BP 128/80 | HR 60 | Temp 98.3°F | Ht 65.0 in | Wt 194.3 lb

## 2012-01-23 DIAGNOSIS — S46009A Unspecified injury of muscle(s) and tendon(s) of the rotator cuff of unspecified shoulder, initial encounter: Secondary | ICD-10-CM

## 2012-01-23 DIAGNOSIS — M75100 Unspecified rotator cuff tear or rupture of unspecified shoulder, not specified as traumatic: Secondary | ICD-10-CM | POA: Insufficient documentation

## 2012-01-23 DIAGNOSIS — S4980XA Other specified injuries of shoulder and upper arm, unspecified arm, initial encounter: Secondary | ICD-10-CM

## 2012-01-23 MED ORDER — NITROGLYCERIN 0.1 MG/HR TD PT24: 1.0000 | MEDICATED_PATCH | Freq: Every day | TRANSDERMAL | Status: DC

## 2012-01-23 MED ORDER — HYDROCODONE-ACETAMINOPHEN 5-500 MG PO TABS: 0.5000 | ORAL_TABLET | Freq: Three times a day (TID) | ORAL | Status: AC | PRN

## 2012-01-23 NOTE — Assessment & Plan Note (Signed)
Patient has significant weakness of his right shoulder. Patient is right-hand-dominant and this is incapacitating her from her regular activities. After long discussion we will get imaging which will include a CT arthrogram patient is unable to do an MRI secondary to having a pacemaker. Would consider an ultrasound at sports medicine but already had CT ordered. Patient would hold on referral to orthopedic surgery once imaging his back and confirms tear that would send her. Patient given small amount of Vicodin for pain told to attempt to continue the stretches and exercises when possible also try to do nitroglycerin patch to seek out help with some of the vascularization in the area. Did not do injection in case surgery could occur in the next 6 weeks.

## 2012-01-23 NOTE — Progress Notes (Signed)
  Subjective:    Patient ID: [name], female    DOB: 1946-04-14, 66 y.o.   MRN: [id number]  HPI 66 year old female coming back for followup on right arm pain. Per Dr. Verdie Drown screening note patient states that she's had this problem in some weakness in his arm. There is concerned and patient had a rotator cuff tendinopathy patient declined a steroid injection at that time given exercises and tramadol. Have to avoid anti-inflammatory secondary to history of GI bleed. Patient states that that she is becoming more weak and is having more pain. Patient only took a couple of tramadol and states that it did help but made her feel funny. Patient is concerned about the loss of strength.  Review of Systems As stated above    Objective:   Physical Exam General: No apparent distress Shoulder: Right Inspection reveals no abnormalities, atrophy or asymmetry. Patient though on active range of motion is significantly decreased on the right having to use the scapular muscles to try to raise right arm cannot get to 90. Palpation is normal with no tenderness over AC joint or bicipital groove.  Rotator cuff strength 2/5 with a positive drop arm test on the right + Neer and Hawkin's tests, empty can. Speeds and Yergason's tests normal. No labral pathology noted with negative Obrien's, negative clunk and good stability.    Assessment & Plan:

## 2012-01-23 NOTE — Patient Instructions (Signed)
Very nice to meet you. We will get imaging of the shoulder. I do think likely you have a tear and would need surgery to fix it properly. For your pain I will do Vicodin take half tab up to 3 times a day but be careful this can make you sleepy. I am also going to give you the nitroglycerin patch to wear on your shoulder change daily. The most common side effect is headache if you have one cut the patch in half. I will call you with the results and then we can consider sending you to a surgeon.

## 2012-01-26 ENCOUNTER — Telehealth: Payer: Self-pay | Admitting: *Deleted

## 2012-01-26 NOTE — Telephone Encounter (Signed)
Message copied by Hulan Saas on Mon Jan 26, 2012  4:35 PM ------      Message from: Hulan Saas      Created: Wed Jan 14, 2012  9:53 AM       Call and remind patient due for CBC on 01/28/12 DB

## 2012-01-26 NOTE — Telephone Encounter (Signed)
Spoke with patient and reminded her of lab due.

## 2012-01-27 ENCOUNTER — Other Ambulatory Visit (INDEPENDENT_AMBULATORY_CARE_PROVIDER_SITE_OTHER): Payer: PRIVATE HEALTH INSURANCE

## 2012-01-27 DIAGNOSIS — D649 Anemia, unspecified: Secondary | ICD-10-CM

## 2012-01-27 LAB — CBC WITH DIFFERENTIAL/PLATELET
Basophils Absolute: 0 10*3/uL (ref 0.0–0.1)
Basophils Relative: 0.6 % (ref 0.0–3.0)
Eosinophils Absolute: 0.1 10*3/uL (ref 0.0–0.7)
Lymphocytes Relative: 26.4 % (ref 12.0–46.0)
MCHC: 33 g/dL (ref 30.0–36.0)
Neutrophils Relative %: 60.9 % (ref 43.0–77.0)
RBC: 4.05 Mil/uL (ref 3.87–5.11)
RDW: 14.2 % (ref 11.5–14.6)

## 2012-01-27 LAB — IBC PANEL
Saturation Ratios: 25.2 % (ref 20.0–50.0)
Transferrin: 246.6 mg/dL (ref 212.0–360.0)

## 2012-01-28 ENCOUNTER — Other Ambulatory Visit: Payer: Self-pay | Admitting: Family Medicine

## 2012-01-28 ENCOUNTER — Encounter (HOSPITAL_COMMUNITY)
Admission: RE | Admit: 2012-01-28 | Discharge: 2012-01-28 | Disposition: A | Discharge status: Home / Self Care | Payer: PRIVATE HEALTH INSURANCE | Source: Ambulatory Visit | Attending: Family Medicine | Admitting: Family Medicine

## 2012-01-28 ENCOUNTER — Ambulatory Visit (HOSPITAL_COMMUNITY)
Admission: RE | Admit: 2012-01-28 | Discharge: 2012-01-28 | Disposition: A | Discharge status: Home / Self Care | Payer: PRIVATE HEALTH INSURANCE | Source: Ambulatory Visit | Attending: Family Medicine | Admitting: Family Medicine

## 2012-01-28 ENCOUNTER — Telehealth: Payer: Self-pay | Admitting: *Deleted

## 2012-01-28 DIAGNOSIS — S46009A Unspecified injury of muscle(s) and tendon(s) of the rotator cuff of unspecified shoulder, initial encounter: Secondary | ICD-10-CM

## 2012-01-28 DIAGNOSIS — M25519 Pain in unspecified shoulder: Secondary | ICD-10-CM | POA: Insufficient documentation

## 2012-01-28 DIAGNOSIS — W19XXXA Unspecified fall, initial encounter: Secondary | ICD-10-CM | POA: Insufficient documentation

## 2012-01-28 DIAGNOSIS — M6281 Muscle weakness (generalized): Secondary | ICD-10-CM | POA: Insufficient documentation

## 2012-01-28 DIAGNOSIS — S46819A Strain of other muscles, fascia and tendons at shoulder and upper arm level, unspecified arm, initial encounter: Secondary | ICD-10-CM | POA: Insufficient documentation

## 2012-01-28 DIAGNOSIS — D649 Anemia, unspecified: Secondary | ICD-10-CM

## 2012-01-28 MED ORDER — IOHEXOL 180 MG/ML  SOLN
20.0000 mL | Freq: Once | INTRAMUSCULAR | Status: AC | PRN
  Administered 2012-01-28: 10 mL

## 2012-01-28 NOTE — Procedures (Signed)
The right shoulder joint was accessed under fluoroscopy and injected with approximately 12 ml a mixture of 5 cc's lidocaine and 15 cc's Ominpaque [id number] without complication.

## 2012-01-28 NOTE — Telephone Encounter (Signed)
Message copied by Hulan Saas on Wed Jan 28, 2012  1:55 PM ------      Message from: [name]      Created: Wed Jan 28, 2012  1:01 PM       Please call pt, her H/H and iron studies are doing great, repeat CBC in 6 weeks.

## 2012-01-28 NOTE — Telephone Encounter (Signed)
Spoke with patient and gave her results. Labs in EPIC. Note to remind patient

## 2012-01-29 ENCOUNTER — Encounter: Payer: Self-pay | Admitting: Family Medicine

## 2012-01-30 ENCOUNTER — Telehealth (HOSPITAL_COMMUNITY): Payer: Self-pay | Admitting: *Deleted

## 2012-01-30 NOTE — Telephone Encounter (Signed)
Post procedure follow up call. No answer, voice mail left

## 2012-02-03 ENCOUNTER — Encounter: Payer: Self-pay | Admitting: Family Medicine

## 2012-02-12 ENCOUNTER — Encounter: Payer: Self-pay | Admitting: *Deleted

## 2012-02-12 ENCOUNTER — Ambulatory Visit (INDEPENDENT_AMBULATORY_CARE_PROVIDER_SITE_OTHER): Payer: PRIVATE HEALTH INSURANCE | Admitting: Family Medicine

## 2012-02-12 VITALS — BP 116/76 | HR 69 | Temp 98.1°F | Ht 65.0 in | Wt 188.0 lb

## 2012-02-12 DIAGNOSIS — S46819A Strain of other muscles, fascia and tendons at shoulder and upper arm level, unspecified arm, initial encounter: Secondary | ICD-10-CM

## 2012-02-12 DIAGNOSIS — M75100 Unspecified rotator cuff tear or rupture of unspecified shoulder, not specified as traumatic: Secondary | ICD-10-CM

## 2012-02-12 MED ORDER — SUCRALFATE 1 G PO TABS: 1.0000 g | ORAL_TABLET | Freq: Two times a day (BID) | ORAL | Status: DC

## 2012-02-12 NOTE — Patient Instructions (Addendum)
Please do those shoulder stretches/exercises at least once a day. Two times a day would be better. Use the pain medication (Tramadol) to do those stretches.  Continue to use the nitroglycerin patch (half a patch is fine).  Follow-up in 1 week. We can consider doing an injection of your shoulder at that time.

## 2012-02-12 NOTE — Progress Notes (Signed)
  Subjective:    Patient ID: [name], female    DOB: [birth year], 66 y.o.   MRN: [id number]  HPI Follow-up of rotator cuff tear:  Pain is not worse/slightly improved.  Not taking Vicodin every day. Uses half a patch of NTG because a full patch caused her to become dizzy. Not doing shoulder stretches/exercises  Review of Systems Per HPI    Objective:   Physical Exam Gen: NAD Right shoulder: cannot passive abduct or flex past 90 degrees; very tender to palpation     Assessment & Plan:

## 2012-02-13 ENCOUNTER — Encounter: Payer: Self-pay | Admitting: Family Medicine

## 2012-02-13 NOTE — Assessment & Plan Note (Signed)
Discussed CT results showing full thickness supraspinatus tear.  Shoulder pain stable, however, physical exam concerning for development of frozen shoulder. Patient not taking pain medication and not doing stretches/exercises due to pain.  Emphasized need to keep shoulder mobile. Encouraged her to do maneuvers and take adequate pain medication. Offered joint injection. Patient wants to be given 1 more week. If not improved, will consider joint injection and/or physical therapy at that time.  Patient does not want to see surgeon yet, however, is open to it if shoulder pain persists.   Follow-up in 1 week.

## 2012-02-17 ENCOUNTER — Encounter: Payer: Self-pay | Admitting: Family Medicine

## 2012-02-17 ENCOUNTER — Ambulatory Visit (INDEPENDENT_AMBULATORY_CARE_PROVIDER_SITE_OTHER): Payer: PRIVATE HEALTH INSURANCE | Admitting: Family Medicine

## 2012-02-17 VITALS — BP 118/80 | HR 75 | Temp 98.0°F | Ht 65.0 in | Wt 189.1 lb

## 2012-02-17 DIAGNOSIS — M75100 Unspecified rotator cuff tear or rupture of unspecified shoulder, not specified as traumatic: Secondary | ICD-10-CM

## 2012-02-17 DIAGNOSIS — S46819A Strain of other muscles, fascia and tendons at shoulder and upper arm level, unspecified arm, initial encounter: Secondary | ICD-10-CM

## 2012-02-17 MED ORDER — HYDROCODONE-ACETAMINOPHEN 5-500 MG PO TABS: 1.0000 | ORAL_TABLET | Freq: Three times a day (TID) | ORAL | Status: DC | PRN

## 2012-02-17 NOTE — Patient Instructions (Signed)
It is really important for you to do those shoulder exercises/stretches. We will refer you to physical therapy.  Take the Vicodin before and after.   Follow-up in 4-6 weeks.   If you develop fevers, significantly worsening pain, swelling, redness, warmth in that shoulder, please make an appointment to be seen at the clinic right away or go to the ED.

## 2012-02-17 NOTE — Assessment & Plan Note (Signed)
Kenalog/lidocaine left shoulder injection. Pain did not seem significantly alleviated following injection, however, patient does have frozen shoulder.  Physical therapy referral. Follow-up in 1-2 months.

## 2012-02-17 NOTE — Progress Notes (Signed)
  Subjective:    Patient ID: [name], female    DOB: 10/11/1946, 66 y.o.   MRN: [id number]  HPI Follow-up rotator cuff tear pain:  Pain unchanged.  Taking 1/2 tablet of Vicodin or Tramadol a day.  Not doing shoulder stretches/exercises due to pain.   Would like joint injection today. Would like to try injection and conservative management before seeing surgeon.    Review of Systems Denies fevers, worsening pain.     Objective:   Physical Exam Gen: NAD R shoulder: passive ROM abduction and flexion < 90 deg    Assessment & Plan:

## 2012-02-19 ENCOUNTER — Ambulatory Visit: Payer: PRIVATE HEALTH INSURANCE | Attending: Family Medicine | Admitting: Physical Therapy

## 2012-02-19 DIAGNOSIS — M25519 Pain in unspecified shoulder: Secondary | ICD-10-CM | POA: Insufficient documentation

## 2012-02-19 DIAGNOSIS — M25619 Stiffness of unspecified shoulder, not elsewhere classified: Secondary | ICD-10-CM | POA: Insufficient documentation

## 2012-02-19 DIAGNOSIS — IMO0001 Reserved for inherently not codable concepts without codable children: Secondary | ICD-10-CM | POA: Insufficient documentation

## 2012-02-23 ENCOUNTER — Ambulatory Visit: Payer: PRIVATE HEALTH INSURANCE | Admitting: Physical Therapy

## 2012-02-25 ENCOUNTER — Encounter: Payer: Self-pay | Admitting: Internal Medicine

## 2012-02-25 ENCOUNTER — Ambulatory Visit (INDEPENDENT_AMBULATORY_CARE_PROVIDER_SITE_OTHER): Payer: PRIVATE HEALTH INSURANCE | Admitting: Internal Medicine

## 2012-02-25 ENCOUNTER — Other Ambulatory Visit (INDEPENDENT_AMBULATORY_CARE_PROVIDER_SITE_OTHER): Payer: PRIVATE HEALTH INSURANCE

## 2012-02-25 VITALS — BP 128/84 | HR 76 | Ht 65.0 in | Wt 191.1 lb

## 2012-02-25 DIAGNOSIS — D509 Iron deficiency anemia, unspecified: Secondary | ICD-10-CM

## 2012-02-25 DIAGNOSIS — D539 Nutritional anemia, unspecified: Secondary | ICD-10-CM

## 2012-02-25 DIAGNOSIS — R195 Other fecal abnormalities: Secondary | ICD-10-CM

## 2012-02-25 DIAGNOSIS — K31819 Angiodysplasia of stomach and duodenum without bleeding: Secondary | ICD-10-CM

## 2012-02-25 LAB — CBC WITH DIFFERENTIAL/PLATELET
Basophils Absolute: 0 10*3/uL (ref 0.0–0.1)
Eosinophils Absolute: 0.1 10*3/uL (ref 0.0–0.7)
HCT: 36.7 % (ref 36.0–46.0)
Hemoglobin: 12 g/dL (ref 12.0–15.0)
Lymphs Abs: 1.2 10*3/uL (ref 0.7–4.0)
MCHC: 32.7 g/dL (ref 30.0–36.0)
MCV: 87.3 fl (ref 78.0–100.0)
Neutro Abs: 2.4 10*3/uL (ref 1.4–7.7)
RDW: 14.4 % (ref 11.5–14.6)

## 2012-02-25 NOTE — Progress Notes (Signed)
[name] Oct 18, 1946 MRN [id number]    History of Present Illness:  This is a 66 year old African American female with chronic GI blood loss due to gastric AVMs. Her most recent small bowel capsule endoscopy in January 2013 showed gastric avm's but there was no evidence of small bowel AVMs. Her last hemoglobin has increased to 11.9 with a hematocrit of 36.0 as a result of iron supplements. She has no complaints today. She was also asked to stop her alcohol intake which her daughter, Cecelia Byars, thought was excessive. She has been on B12 supplements as well. She had an exhaustive GI workup including endoscopies and colonoscopies. She has been off anticoagulants. Her last episode of  bleeding occurred several days after she restarted Xeralto. We have discussed it with Dr.Allred who is her cardiologist and he allowed [place] to stop the anticoagulants.  Past Medical History  Diagnosis Date  . Angioedema     2/2 ACE  . RLS (restless legs syndrome)     Dx 06/2007  . Anemia   . Arteriovenous malformation of stomach   . Permanent atrial fibrillation   . Renal failure     baseline creatinine 1.6  . Hyperlipidemia   . Depression   . Diastolic heart failure   . Arthritis   . Sleep apnea   . Asthma   . Hx of colonic polyp 08/13/10    adenomatous  . Fatty liver 07/26/10  . Panic attacks   . Hypertension   . Complete heart block     s/p PPM 1998  . Hypertrophic cardiomyopathy     dx by Dr [name] 2009  . Hx of colonoscopy   . GI bleed   . Panic attack   . DDD (degenerative disc disease)   . History of alcohol abuse Stopped Fall 2012  . History of tobacco use Quit Fall 2012  . Shortness of breath     sob on exertation  . CHF (congestive heart failure)   . Angina   . GERD (gastroesophageal reflux disease)   . AVM (arteriovenous malformation) of colon     small intestine; stomach  . Right arm pain 01/08/2012   Past Surgical History  Procedure Date  . Tubal ligation 04/01/2000  .  Pacemaker insertion [place], most recent gen change by [name] 4/12  . Cardiac catheterization   . Esophagogastroduodenoscopy 12/23/2011    Procedure: ESOPHAGOGASTRODUODENOSCOPY (EGD);  Surgeon: [name], MD;  Location: [facility];  Service: Endoscopy;  Laterality: N/A;  . Givens capsule study 12/23/2011    Procedure: GIVENS CAPSULE STUDY;  Surgeon: [name], MD;  Location: [facility];  Service: Endoscopy;  Laterality: N/A;    reports that she quit smoking about 6 months ago. Her smoking use included Cigarettes. She smoked .1 packs per day. She has never used smokeless tobacco. She reports that she does not drink alcohol or use illicit drugs. family history includes Alcohol abuse in her brother and father; Cancer in her brother; Colon cancer in her brother; Diabetes in her brother and sister; Heart attack in her brother and father; Heart disease in her mother and sister; Hypertension in her brother, father, mother, and sister; and Stroke in her father and mother. Allergies  Allergen Reactions  . Ace Inhibitors     REACTION: angioedema      Review of Systems:Denies dysphagia, odynophagia, heartburn chest pain or shortness of breath  The remainder of the 10 point ROS is negative except as outlined in H&P  Physical Exam: General appearance  Well developed, in no distress. Eyes- non icteric. HEENT nontraumatic, normocephalic. Mouth no lesions, tongue papillated, no cheilosis. Neck supple without adenopathy, thyroid not enlarged, no carotid bruits, no JVD. Lungs Clear to auscultation bilaterally. Cor normal S1, normal S2, regular rhythm, no murmur,  quiet precordium. Abdomen: Soft nontender  Rectal:Not done  Extremities no pedal edema. Skin no lesions. Neurological alert and oriented x 3. Psychological normal mood and affect.  Assessment and Plan:  Problem #1 gastric AVMs. This is causing slow  GI bleed. Doing well off anticoagulants. We will check CBC, iron levels and  B12 today, we will give her Hemoccult cards today  Problem #2 colon polyps. Last colonoscopy August 2011. She has a positive family history of colon cancer in a direct relative. She had tubular adenoma in 2005. Recall colonoscopy is in August 2016   02/25/2012 Delfin Edis

## 2012-02-25 NOTE — Patient Instructions (Addendum)
Your physician has requested that you go to the basement for the following lab work before leaving today: CBC, IBC, B12  We have given you hemoccult cards to complete at home. Please follow written directions on the package and mail back to our office within 1 month. CC: Dr Karen Kays, Dr Rayann Heman

## 2012-02-27 ENCOUNTER — Telehealth: Payer: Self-pay | Admitting: Internal Medicine

## 2012-02-27 DIAGNOSIS — D649 Anemia, unspecified: Secondary | ICD-10-CM

## 2012-02-27 NOTE — Telephone Encounter (Signed)
Yes, please. Recheck CBC in 6 weeks. ----- Message ----- From: Hulan Saas, RN Sent: 02/26/2012 8:56 AM To: [name], MD

## 2012-02-27 NOTE — Telephone Encounter (Signed)
Left a message for patient to call me. 

## 2012-03-01 ENCOUNTER — Other Ambulatory Visit: Payer: Self-pay | Admitting: *Deleted

## 2012-03-01 ENCOUNTER — Ambulatory Visit: Payer: PRIVATE HEALTH INSURANCE | Admitting: Physical Therapy

## 2012-03-01 DIAGNOSIS — E538 Deficiency of other specified B group vitamins: Secondary | ICD-10-CM

## 2012-03-01 MED ORDER — CYANOCOBALAMIN 1000 MCG/ML IJ SOLN
1000.0000 ug | INTRAMUSCULAR | Status: DC
  Administered 2012-03-03 – 2012-04-05 (×2): 1000 ug via INTRAMUSCULAR

## 2012-03-01 NOTE — Telephone Encounter (Signed)
Patient is calling because she understood that she was to start B 12 injections. Do not see any orders for this. Please, advise.

## 2012-03-01 NOTE — Telephone Encounter (Signed)
Spoke with patient and scheduled her for B 12 injection on 03/03/12 at 3:00 PM. Labs in [facility] for CBC on 04/05/12. Note to remind patient.

## 2012-03-01 NOTE — Telephone Encounter (Signed)
Yes, please start B12 1000ug IM monthly x 6, then recheck B12 level

## 2012-03-03 ENCOUNTER — Ambulatory Visit: Payer: PRIVATE HEALTH INSURANCE | Admitting: Physical Therapy

## 2012-03-03 ENCOUNTER — Encounter: Payer: Self-pay | Admitting: Internal Medicine

## 2012-03-03 ENCOUNTER — Ambulatory Visit (INDEPENDENT_AMBULATORY_CARE_PROVIDER_SITE_OTHER): Payer: PRIVATE HEALTH INSURANCE | Admitting: Internal Medicine

## 2012-03-03 ENCOUNTER — Other Ambulatory Visit: Payer: Self-pay | Admitting: Family Medicine

## 2012-03-03 DIAGNOSIS — E538 Deficiency of other specified B group vitamins: Secondary | ICD-10-CM

## 2012-03-05 ENCOUNTER — Telehealth: Payer: Self-pay | Admitting: *Deleted

## 2012-03-05 NOTE — Telephone Encounter (Signed)
Message copied by Hulan Saas on Fri Mar 05, 2012  8:57 AM ------      Message from: Hulan Saas      Created: Wed Jan 28, 2012  2:02 PM       Call and remind patient cbc due for DB on 03/10/12

## 2012-03-05 NOTE — Telephone Encounter (Signed)
Spoke with patient and she will come next week for lab.

## 2012-03-10 ENCOUNTER — Other Ambulatory Visit (INDEPENDENT_AMBULATORY_CARE_PROVIDER_SITE_OTHER): Payer: PRIVATE HEALTH INSURANCE

## 2012-03-10 DIAGNOSIS — D649 Anemia, unspecified: Secondary | ICD-10-CM

## 2012-03-10 LAB — CBC WITH DIFFERENTIAL/PLATELET
Basophils Absolute: 0 10*3/uL (ref 0.0–0.1)
Basophils Relative: 0.4 % (ref 0.0–3.0)
Eosinophils Absolute: 0.1 10*3/uL (ref 0.0–0.7)
HCT: 33.7 % — ABNORMAL LOW (ref 36.0–46.0)
Hemoglobin: 11.2 g/dL — ABNORMAL LOW (ref 12.0–15.0)
Lymphs Abs: 0.9 10*3/uL (ref 0.7–4.0)
MCHC: 33.3 g/dL (ref 30.0–36.0)
Monocytes Relative: 12 % (ref 3.0–12.0)
Neutro Abs: 1.8 10*3/uL (ref 1.4–7.7)
RDW: 14.4 % (ref 11.5–14.6)

## 2012-03-11 ENCOUNTER — Other Ambulatory Visit: Payer: Self-pay | Admitting: *Deleted

## 2012-03-11 DIAGNOSIS — D649 Anemia, unspecified: Secondary | ICD-10-CM

## 2012-03-18 ENCOUNTER — Other Ambulatory Visit: Payer: PRIVATE HEALTH INSURANCE

## 2012-03-18 DIAGNOSIS — K31819 Angiodysplasia of stomach and duodenum without bleeding: Secondary | ICD-10-CM

## 2012-03-18 DIAGNOSIS — R195 Other fecal abnormalities: Secondary | ICD-10-CM

## 2012-03-18 LAB — HEMOCCULT SLIDES (X 3 CARDS)
Fecal Occult Blood: NEGATIVE
OCCULT 4: NEGATIVE

## 2012-03-24 ENCOUNTER — Encounter: Payer: Self-pay | Admitting: Family Medicine

## 2012-03-24 ENCOUNTER — Ambulatory Visit (INDEPENDENT_AMBULATORY_CARE_PROVIDER_SITE_OTHER): Payer: PRIVATE HEALTH INSURANCE | Admitting: Family Medicine

## 2012-03-24 VITALS — BP 110/71 | HR 97 | Temp 98.6°F | Ht 66.0 in | Wt 194.2 lb

## 2012-03-24 DIAGNOSIS — I10 Essential (primary) hypertension: Secondary | ICD-10-CM

## 2012-03-24 DIAGNOSIS — M75100 Unspecified rotator cuff tear or rupture of unspecified shoulder, not specified as traumatic: Secondary | ICD-10-CM

## 2012-03-24 DIAGNOSIS — S46819A Strain of other muscles, fascia and tendons at shoulder and upper arm level, unspecified arm, initial encounter: Secondary | ICD-10-CM

## 2012-03-24 NOTE — Patient Instructions (Signed)
For your right shoulder, we will try to get you to see a physical therapist. In the meantime, continue those shoulder stretches and exercises. Take the Tramadol as needed for pain. Continue to use the nitroglycerin patches as needed.  Follow-up in 1 month for re-evaluation of your shoulder. Please also make an appointment for a Pap smear and physical in the next few months.

## 2012-03-24 NOTE — Progress Notes (Signed)
  Subjective:    Patient ID: [name], female    DOB: 11-14-1946, 66 y.o.   MRN: [id number]  HPI Follow-up right rotator cuff tear Pain is improving. She is doing exercises at home, however, she feels sore afterwards. She still feels weak on that side and has trouble carrying heavy things.  Review of Systems Per HPI    Objective:   Physical Exam Gen: NAD MSK   Right shoulder:     No tenderness, erythema, swelling     Strength 3-4/5 strength upper arm compared to 4-5/5 strength left upper arm     Sensation intact     Negative Neers/Hawkins-Kennedy          ROM: 90 deg abduction and 120 deg flexion (full ROM on left) Skin: warm, dry    Assessment & Plan:

## 2012-03-24 NOTE — Assessment & Plan Note (Signed)
Pain is improved, however, still with limited ROM and weakness.  Patient trying to do exercises at home, but will benefit from physical therapy and patient expressed interest.  Will refer but patient said she was unsure whether her insurance would cover this because she is given a limited amount of physical therapy over certain period of time.  Will defer on surgery for now and will try to optimize on conservative management. May continue NTG patch and tramadol prn. Follow-up 1 month.

## 2012-03-25 LAB — BASIC METABOLIC PANEL
Glucose, Bld: 94 mg/dL (ref 70–99)
Potassium: 5 mEq/L (ref 3.5–5.3)
Sodium: 140 mEq/L (ref 135–145)

## 2012-03-30 ENCOUNTER — Other Ambulatory Visit: Payer: Self-pay | Admitting: Family Medicine

## 2012-03-30 DIAGNOSIS — Z1231 Encounter for screening mammogram for malignant neoplasm of breast: Secondary | ICD-10-CM

## 2012-04-01 ENCOUNTER — Other Ambulatory Visit: Payer: Self-pay | Admitting: Family Medicine

## 2012-04-02 ENCOUNTER — Telehealth: Payer: Self-pay | Admitting: *Deleted

## 2012-04-02 NOTE — Telephone Encounter (Signed)
Message copied by Hulan Saas on Fri Apr 02, 2012 11:28 AM ------      Message from: Hulan Saas      Created: Mon Mar 01, 2012  2:51 PM       Call and remind patient to come for CBC/B12 injection on 04/05/12

## 2012-04-02 NOTE — Telephone Encounter (Signed)
Spoke with patient and reminded her of injection and labs on 04/05/12

## 2012-04-05 ENCOUNTER — Other Ambulatory Visit (INDEPENDENT_AMBULATORY_CARE_PROVIDER_SITE_OTHER): Payer: PRIVATE HEALTH INSURANCE

## 2012-04-05 ENCOUNTER — Other Ambulatory Visit: Payer: Self-pay | Admitting: *Deleted

## 2012-04-05 ENCOUNTER — Ambulatory Visit (INDEPENDENT_AMBULATORY_CARE_PROVIDER_SITE_OTHER): Payer: PRIVATE HEALTH INSURANCE | Admitting: Internal Medicine

## 2012-04-05 DIAGNOSIS — D649 Anemia, unspecified: Secondary | ICD-10-CM

## 2012-04-05 DIAGNOSIS — E538 Deficiency of other specified B group vitamins: Secondary | ICD-10-CM

## 2012-04-05 LAB — CBC WITH DIFFERENTIAL/PLATELET
Basophils Relative: 0.5 % (ref 0.0–3.0)
Eosinophils Relative: 2.1 % (ref 0.0–5.0)
HCT: 33.4 % — ABNORMAL LOW (ref 36.0–46.0)
Hemoglobin: 11.1 g/dL — ABNORMAL LOW (ref 12.0–15.0)
Lymphs Abs: 1.2 10*3/uL (ref 0.7–4.0)
MCV: 87.4 fl (ref 78.0–100.0)
Monocytes Absolute: 0.5 10*3/uL (ref 0.1–1.0)
Neutro Abs: 2.6 10*3/uL (ref 1.4–7.7)
Platelets: 200 10*3/uL (ref 150.0–400.0)
WBC: 4.4 10*3/uL — ABNORMAL LOW (ref 4.5–10.5)

## 2012-04-14 ENCOUNTER — Ambulatory Visit
Admission: RE | Admit: 2012-04-14 | Discharge: 2012-04-14 | Disposition: A | Discharge status: Home / Self Care | Payer: PRIVATE HEALTH INSURANCE | Source: Ambulatory Visit | Attending: Family Medicine | Admitting: Family Medicine

## 2012-04-14 ENCOUNTER — Telehealth: Payer: Self-pay | Admitting: *Deleted

## 2012-04-14 DIAGNOSIS — Z1231 Encounter for screening mammogram for malignant neoplasm of breast: Secondary | ICD-10-CM

## 2012-04-14 NOTE — Telephone Encounter (Signed)
Patient had CBC on 04/05/12

## 2012-04-14 NOTE — Telephone Encounter (Signed)
Message copied by Hulan Saas on Wed Apr 14, 2012  9:26 AM ------      Message from: Hulan Saas      Created: Thu Mar 11, 2012 10:28 AM       Call and remind patient due for CBC 04/19/12 DB

## 2012-04-15 ENCOUNTER — Ambulatory Visit: Payer: PRIVATE HEALTH INSURANCE

## 2012-04-26 ENCOUNTER — Encounter: Payer: Self-pay | Admitting: Family Medicine

## 2012-04-26 ENCOUNTER — Ambulatory Visit (INDEPENDENT_AMBULATORY_CARE_PROVIDER_SITE_OTHER): Payer: PRIVATE HEALTH INSURANCE | Admitting: Family Medicine

## 2012-04-26 VITALS — BP 111/76 | HR 95 | Temp 98.4°F | Ht 66.0 in | Wt 193.0 lb

## 2012-04-26 DIAGNOSIS — M75100 Unspecified rotator cuff tear or rupture of unspecified shoulder, not specified as traumatic: Secondary | ICD-10-CM

## 2012-04-26 DIAGNOSIS — R42 Dizziness and giddiness: Secondary | ICD-10-CM

## 2012-04-26 DIAGNOSIS — Q279 Congenital malformation of peripheral vascular system, unspecified: Secondary | ICD-10-CM

## 2012-04-26 DIAGNOSIS — S46819A Strain of other muscles, fascia and tendons at shoulder and upper arm level, unspecified arm, initial encounter: Secondary | ICD-10-CM

## 2012-04-26 DIAGNOSIS — Q2730 Arteriovenous malformation, site unspecified: Secondary | ICD-10-CM

## 2012-04-26 MED ORDER — SENNA 8.6 MG PO TABS: 1.0000 | ORAL_TABLET | Freq: Every evening | ORAL | Status: DC | PRN

## 2012-04-26 MED ORDER — SUPER PROBIOTIC PO CAPS: 1.0000 | ORAL_CAPSULE | Freq: Every day | ORAL | Status: DC

## 2012-04-26 NOTE — Assessment & Plan Note (Signed)
Significantly improved today. Continue 1/4 NTG patch prn (decreasing strength due to complaints of lightheadedness).  Vicodin prn.  Continue exercises/stretches.  Follow-up prn.

## 2012-04-26 NOTE — Assessment & Plan Note (Addendum)
New. Mild lightheadedness.  May be due to medications. Will decrease or D/C NTG patch; asked patient to decrease from 1/2 to 1/4 patch. Encouraged writing down diary of when she is lightheadedness and trying to recall which medications she just took.  She is also on Lasix and Coreg, but she has been on these medications for a while. Followed by Dr. Rayann Heman for pacemaker for complete heart block, chronic diastolic heart failure.  She is also on Vicodin, which she takes a small amount every few days.  Follow-up if symptoms worsen or will call and let me know if she could figure out which medication is associated with this.

## 2012-04-26 NOTE — Assessment & Plan Note (Signed)
Stable. Iron tablets, sucralfate, prn omeprazole per Dr. [name].

## 2012-04-26 NOTE — Progress Notes (Signed)
  Subjective:    Patient ID: [name], female    DOB: Nov 20, 1946, 66 y.o.   MRN: [id number]  HPI 1. Right supraspinatus tear follow-up She is doing much better.  Using 1/2 patch of NTG daily.  Takes hydrocodone/Tylenol infrequently (every few days).  Doing exercises intermittently.   2. Complaining of lightheadedness.  Not sure why. Thinks may be associated with one of her medications, but she is not sure which one. ?Sucralfate.  No vomiting or diarrhea. She drinks a few cups of water a day.   3. History of GIB Taking iron, sucralfate, and prn omeprazole. Saw Dr. [name] recently. No new changes.   Family, past medical history reviewed.   Review of Systems Denies chest pain/palpitations    Objective:   Physical Exam Gen: NAD MSK:   Right shoulder:      ROM: 170 deg abduction     Strength: 3-4/5 compared to 4/5 left     Maneuvers: negative Neers, mildly positive Hawkins-Kennedy    Assessment & Plan:

## 2012-05-03 ENCOUNTER — Inpatient Hospital Stay (HOSPITAL_COMMUNITY)
Admission: EM | Admit: 2012-05-03 | Discharge: 2012-05-04 | DRG: 204 | Disposition: A | Discharge status: Home / Self Care | Payer: PRIVATE HEALTH INSURANCE | Attending: Internal Medicine | Admitting: Internal Medicine

## 2012-05-03 ENCOUNTER — Emergency Department (HOSPITAL_COMMUNITY): Payer: PRIVATE HEALTH INSURANCE

## 2012-05-03 ENCOUNTER — Encounter (HOSPITAL_COMMUNITY): Payer: Self-pay

## 2012-05-03 DIAGNOSIS — I5033 Acute on chronic diastolic (congestive) heart failure: Secondary | ICD-10-CM

## 2012-05-03 DIAGNOSIS — E785 Hyperlipidemia, unspecified: Secondary | ICD-10-CM | POA: Diagnosis present

## 2012-05-03 DIAGNOSIS — Z860100 Personal history of colon polyps, unspecified: Secondary | ICD-10-CM

## 2012-05-03 DIAGNOSIS — R0989 Other specified symptoms and signs involving the circulatory and respiratory systems: Principal | ICD-10-CM | POA: Diagnosis present

## 2012-05-03 DIAGNOSIS — Z79899 Other long term (current) drug therapy: Secondary | ICD-10-CM

## 2012-05-03 DIAGNOSIS — D649 Anemia, unspecified: Secondary | ICD-10-CM | POA: Diagnosis present

## 2012-05-03 DIAGNOSIS — I442 Atrioventricular block, complete: Secondary | ICD-10-CM

## 2012-05-03 DIAGNOSIS — N183 Chronic kidney disease, stage 3 unspecified: Secondary | ICD-10-CM | POA: Diagnosis present

## 2012-05-03 DIAGNOSIS — N184 Chronic kidney disease, stage 4 (severe): Secondary | ICD-10-CM | POA: Diagnosis present

## 2012-05-03 DIAGNOSIS — Z87891 Personal history of nicotine dependence: Secondary | ICD-10-CM

## 2012-05-03 DIAGNOSIS — J45909 Unspecified asthma, uncomplicated: Secondary | ICD-10-CM | POA: Diagnosis present

## 2012-05-03 DIAGNOSIS — I5032 Chronic diastolic (congestive) heart failure: Secondary | ICD-10-CM | POA: Diagnosis present

## 2012-05-03 DIAGNOSIS — R0602 Shortness of breath: Secondary | ICD-10-CM

## 2012-05-03 DIAGNOSIS — G4730 Sleep apnea, unspecified: Secondary | ICD-10-CM | POA: Diagnosis present

## 2012-05-03 DIAGNOSIS — I10 Essential (primary) hypertension: Secondary | ICD-10-CM

## 2012-05-03 DIAGNOSIS — I421 Obstructive hypertrophic cardiomyopathy: Secondary | ICD-10-CM | POA: Diagnosis present

## 2012-05-03 DIAGNOSIS — I129 Hypertensive chronic kidney disease with stage 1 through stage 4 chronic kidney disease, or unspecified chronic kidney disease: Secondary | ICD-10-CM | POA: Diagnosis present

## 2012-05-03 DIAGNOSIS — M719 Bursopathy, unspecified: Secondary | ICD-10-CM | POA: Diagnosis present

## 2012-05-03 DIAGNOSIS — R0609 Other forms of dyspnea: Principal | ICD-10-CM | POA: Diagnosis present

## 2012-05-03 DIAGNOSIS — K219 Gastro-esophageal reflux disease without esophagitis: Secondary | ICD-10-CM | POA: Diagnosis present

## 2012-05-03 DIAGNOSIS — I509 Heart failure, unspecified: Secondary | ICD-10-CM | POA: Diagnosis present

## 2012-05-03 DIAGNOSIS — I4891 Unspecified atrial fibrillation: Secondary | ICD-10-CM | POA: Diagnosis present

## 2012-05-03 DIAGNOSIS — K7689 Other specified diseases of liver: Secondary | ICD-10-CM | POA: Diagnosis present

## 2012-05-03 DIAGNOSIS — Z950 Presence of cardiac pacemaker: Secondary | ICD-10-CM | POA: Diagnosis present

## 2012-05-03 DIAGNOSIS — M129 Arthropathy, unspecified: Secondary | ICD-10-CM | POA: Diagnosis present

## 2012-05-03 DIAGNOSIS — M67919 Unspecified disorder of synovium and tendon, unspecified shoulder: Secondary | ICD-10-CM | POA: Diagnosis present

## 2012-05-03 DIAGNOSIS — I369 Nonrheumatic tricuspid valve disorder, unspecified: Secondary | ICD-10-CM

## 2012-05-03 LAB — CBC
HCT: 32.2 % — ABNORMAL LOW (ref 36.0–46.0)
Hemoglobin: 10.8 g/dL — ABNORMAL LOW (ref 12.0–15.0)
MCH: 28.7 pg (ref 26.0–34.0)
MCV: 86.8 fL (ref 78.0–100.0)
Platelets: 180 10*3/uL (ref 150–400)
RDW: 13.6 % (ref 11.5–15.5)
RDW: 13.7 % (ref 11.5–15.5)
WBC: 3.9 10*3/uL — ABNORMAL LOW (ref 4.0–10.5)
WBC: 3.3 10*3/uL — ABNORMAL LOW (ref 4.0–10.5)

## 2012-05-03 LAB — CARDIAC PANEL(CRET KIN+CKTOT+MB+TROPI)
CK, MB: 3.8 ng/mL (ref 0.3–4.0)
Relative Index: INVALID (ref 0.0–2.5)
Total CK: 85 U/L (ref 7–177)
Troponin I: <0.3 ng/mL (ref <0.30)
Troponin I: <0.3 ng/mL (ref <0.30)
Troponin I: <0.3 ng/mL (ref <0.30)

## 2012-05-03 LAB — URINALYSIS, ROUTINE W REFLEX MICROSCOPIC
Glucose, UA: NEGATIVE mg/dL
Hgb urine dipstick: NEGATIVE
Specific Gravity, Urine: 1.011 (ref 1.005–1.030)
pH: 5.5 (ref 5.0–8.0)

## 2012-05-03 LAB — PRO B NATRIURETIC PEPTIDE: Pro B Natriuretic peptide (BNP): 1064 pg/mL — ABNORMAL HIGH (ref 0–125)

## 2012-05-03 LAB — DIFFERENTIAL
Basophils Absolute: 0 10*3/uL (ref 0.0–0.1)
Basophils Absolute: 0 10*3/uL (ref 0.0–0.1)
Eosinophils Absolute: 0.1 10*3/uL (ref 0.0–0.7)
Eosinophils Relative: 4 % (ref 0–5)
Lymphocytes Relative: 36 % (ref 12–46)
Lymphocytes Relative: 40 % (ref 12–46)
Monocytes Absolute: 0.4 10*3/uL (ref 0.1–1.0)
Neutro Abs: 1.9 10*3/uL (ref 1.7–7.7)
Neutrophils Relative %: 49 % (ref 43–77)

## 2012-05-03 LAB — POCT I-STAT TROPONIN I: Troponin i, poc: 0.13 ng/mL (ref 0.00–0.08)

## 2012-05-03 LAB — COMPREHENSIVE METABOLIC PANEL
Albumin: 3.7 g/dL (ref 3.5–5.2)
Alkaline Phosphatase: 54 U/L (ref 39–117)
BUN: 32 mg/dL — ABNORMAL HIGH (ref 6–23)
CO2: 23 mEq/L (ref 19–32)
Chloride: 100 mEq/L (ref 96–112)
Potassium: 4.2 mEq/L (ref 3.5–5.1)
Total Bilirubin: 0.3 mg/dL (ref 0.3–1.2)

## 2012-05-03 LAB — POCT I-STAT, CHEM 8
Creatinine, Ser: 1.4 mg/dL — ABNORMAL HIGH (ref 0.50–1.10)
HCT: 32 % — ABNORMAL LOW (ref 36.0–46.0)
Hemoglobin: 10.9 g/dL — ABNORMAL LOW (ref 12.0–15.0)
Potassium: 4.1 mEq/L (ref 3.5–5.1)
Sodium: 140 mEq/L (ref 135–145)

## 2012-05-03 LAB — PROTIME-INR: INR: 0.94 (ref 0.00–1.49)

## 2012-05-03 MED ORDER — ASPIRIN EC 325 MG PO TBEC
325.0000 mg | DELAYED_RELEASE_TABLET | Freq: Every day | ORAL | Status: DC
  Administered 2012-05-04: 325 mg via ORAL
  Filled 2012-05-03: qty 1

## 2012-05-03 MED ORDER — ATORVASTATIN CALCIUM 20 MG PO TABS
20.0000 mg | ORAL_TABLET | Freq: Every day | ORAL | Status: DC
  Administered 2012-05-03 – 2012-05-04 (×2): 20 mg via ORAL
  Filled 2012-05-03 (×2): qty 1

## 2012-05-03 MED ORDER — ASPIRIN 81 MG PO CHEW
324.0000 mg | CHEWABLE_TABLET | ORAL | Status: AC
  Administered 2012-05-03: 324 mg via ORAL
  Filled 2012-05-03: qty 4

## 2012-05-03 MED ORDER — HYDROCODONE-ACETAMINOPHEN 5-325 MG PO TABS
1.0000 | ORAL_TABLET | Freq: Four times a day (QID) | ORAL | Status: DC | PRN
  Administered 2012-05-03: 1 via ORAL
  Filled 2012-05-03: qty 1

## 2012-05-03 MED ORDER — POTASSIUM CHLORIDE ER 10 MEQ PO TBCR
20.0000 meq | EXTENDED_RELEASE_TABLET | Freq: Every day | ORAL | Status: DC
  Administered 2012-05-03 – 2012-05-04 (×2): 20 meq via ORAL
  Filled 2012-05-03 (×2): qty 2

## 2012-05-03 MED ORDER — FERROUS SULFATE 325 (65 FE) MG PO TABS
325.0000 mg | ORAL_TABLET | Freq: Every day | ORAL | Status: DC
  Administered 2012-05-03 – 2012-05-04 (×2): 325 mg via ORAL
  Filled 2012-05-03 (×3): qty 1

## 2012-05-03 MED ORDER — CARVEDILOL 12.5 MG PO TABS
12.5000 mg | ORAL_TABLET | Freq: Every day | ORAL | Status: DC
  Administered 2012-05-03 – 2012-05-04 (×2): 12.5 mg via ORAL
  Filled 2012-05-03 (×3): qty 1

## 2012-05-03 MED ORDER — ASPIRIN 300 MG RE SUPP
300.0000 mg | RECTAL | Status: AC
  Filled 2012-05-03: qty 1

## 2012-05-03 MED ORDER — ONDANSETRON HCL 4 MG/2ML IJ SOLN: 4.0000 mg | Freq: Four times a day (QID) | INTRAMUSCULAR | Status: DC | PRN

## 2012-05-03 MED ORDER — CARVEDILOL 6.25 MG PO TABS
6.2500 mg | ORAL_TABLET | Freq: Every evening | ORAL | Status: DC
  Administered 2012-05-03 – 2012-05-04 (×2): 6.25 mg via ORAL
  Filled 2012-05-03 (×2): qty 1

## 2012-05-03 MED ORDER — NITROGLYCERIN 0.4 MG SL SUBL: 0.4000 mg | SUBLINGUAL_TABLET | SUBLINGUAL | Status: DC | PRN

## 2012-05-03 MED ORDER — ADULT MULTIVITAMIN W/MINERALS CH
1.0000 | ORAL_TABLET | Freq: Every day | ORAL | Status: DC
  Administered 2012-05-03 – 2012-05-04 (×2): 1 via ORAL
  Filled 2012-05-03 (×2): qty 1

## 2012-05-03 MED ORDER — SENNA 8.6 MG PO TABS
1.0000 | ORAL_TABLET | Freq: Every evening | ORAL | Status: DC | PRN
  Administered 2012-05-04: 8.6 mg via ORAL
  Filled 2012-05-03 (×2): qty 1

## 2012-05-03 MED ORDER — ALBUTEROL SULFATE HFA 108 (90 BASE) MCG/ACT IN AERS: 2.0000 | INHALATION_SPRAY | RESPIRATORY_TRACT | Status: DC | PRN

## 2012-05-03 MED ORDER — PANTOPRAZOLE SODIUM 40 MG PO TBEC
40.0000 mg | DELAYED_RELEASE_TABLET | Freq: Every day | ORAL | Status: DC
  Administered 2012-05-03 – 2012-05-04 (×2): 40 mg via ORAL
  Filled 2012-05-03 (×2): qty 1

## 2012-05-03 MED ORDER — ALPRAZOLAM 0.5 MG PO TABS: 0.5000 mg | ORAL_TABLET | Freq: Every evening | ORAL | Status: DC | PRN

## 2012-05-03 MED ORDER — FUROSEMIDE 40 MG PO TABS
40.0000 mg | ORAL_TABLET | Freq: Every day | ORAL | Status: DC
  Administered 2012-05-03 – 2012-05-04 (×2): 40 mg via ORAL
  Filled 2012-05-03 (×2): qty 1

## 2012-05-03 MED ORDER — SUCRALFATE 1 G PO TABS
1.0000 g | ORAL_TABLET | Freq: Two times a day (BID) | ORAL | Status: DC
  Administered 2012-05-03 – 2012-05-04 (×4): 1 g via ORAL
  Filled 2012-05-03 (×5): qty 1

## 2012-05-03 MED ORDER — ACETAMINOPHEN 325 MG PO TABS
650.0000 mg | ORAL_TABLET | ORAL | Status: DC | PRN
  Administered 2012-05-03: 650 mg via ORAL
  Filled 2012-05-03: qty 2

## 2012-05-03 NOTE — H&P (Signed)
PCP: Dr. Verdie Drown  Primary Cardiologist: Dr. Thompson Grayer  Primary Electrophysiologist: Dr. Thompson Grayer   CC: Shortness of breath  History of Present Illness: 66 y.o. female with PMH of h/o hypertrophic cardiomyopathy (no SAM or LVOT gradient on prior when paced), diastolic CHF, complete heart block, s/p pacemaker, and atrial fibrillation who presents for evaluation of shortness of breath. She had a normal heart cath in 5/09. Last echo 9/12: severe LVH, EF 55-60%, mild LAE, PASP 46. The patient was in her usual state of health until this evening when she developed acute shortness of breath. She was walking to get the paper and then came back inside and felt shortness of breath. She denied any associated chest pain, fevers, or cough. With this she came to the emergency room, where her symptoms are now improved. She has complained of some palpitations that have been occurring with increasing frequency over the past one month. She notes some occasional lightheadedness when she gets up from a seated position. She denies any syncope. She also notes exertional shortness of breath and breathlessness, which is increased over the past one year. She denies any chest pain.  In the emergency department a troponin was checked and was 0.13 and because of, that. Cardiology was consulted.       Past Medical History  Diagnosis Date  . Angioedema     2/2 ACE  . RLS (restless legs syndrome)     Dx 06/2007  . Anemia   . Arteriovenous malformation of stomach   . Permanent atrial fibrillation   . Renal failure     baseline creatinine 1.6  . Hyperlipidemia   . Depression   . Diastolic heart failure   . Arthritis   . Sleep apnea   . Asthma   . Hx of colonic polyp 08/13/10    adenomatous  . Fatty liver 07/26/10  . Panic attacks   . Hypertension   . Complete heart block     s/p PPM 1998  . Hypertrophic cardiomyopathy     dx by Dr [name] 2009  . Hx of colonoscopy   . GI bleed   . Panic attack   . DDD  (degenerative disc disease)   . History of alcohol abuse Stopped Fall 2012  . History of tobacco use Quit Fall 2012  . Shortness of breath     sob on exertation  . CHF (congestive heart failure)   . Angina   . GERD (gastroesophageal reflux disease)   . AVM (arteriovenous malformation) of colon     small intestine; stomach  . Right arm pain 01/08/2012    History   Social History  . Marital Status: Divorced    Spouse Name: N/A    Number of Children: 50  . Years of Education: N/A   Occupational History  . disabled    Social History Main Topics  . Smoking status: Former Smoker -- 0.1 packs/day    Types: Cigarettes    Quit date: 08/16/2011  . Smokeless tobacco: Never Used   Comment: quit x 3 months  . Alcohol Use: No  . Drug Use: No  . Sexually Active: Not on file   Other Topics Concern  . Not on file   Social History Narrative   On disability for heart failure/pacemaker.  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 4 grown children,  8 grandchildren.  Drinks alcohol a few times a week socially. At one time, the most she reports drinking is about 3 mixed  drinks.Lives with daughter [name] and son. Will be moving 04/2011 to different house because concerned children are taking advantage of her financial situation. No illicit drugs.Smokes few cigarettes daily, especially when stressed. Started smoking @ age 57. Quit January 2012 2/2 health but started smoking again 02/2011.     Family History  Problem Relation Age of Onset  . Hypertension Mother   . Stroke Mother   . Hypertension Father   . Stroke Father   . Hypertension Sister   . Hypertension Brother   . Colon cancer Brother   . Diabetes Sister   . Diabetes Brother   . Cancer Brother   . Heart attack Brother   . Heart attack Father   . Heart disease Mother   . Heart disease Sister   . Alcohol abuse Brother   . Alcohol abuse Father    Current Facility-Administered Medications  Medication Dose Route  Frequency Provider Last Rate Last Dose  . cyanocobalamin ((VITAMIN B-12)) injection 1,000 mcg  1,000 mcg Intramuscular Q30 days [name], MD   1,000 mcg at 09/16/11 1341  . cyanocobalamin ((VITAMIN B-12)) injection 1,000 mcg  1,000 mcg Intramuscular Q30 days [name], MD   1,000 mcg at 04/05/12 1402   Current Outpatient Prescriptions  Medication Sig Dispense Refill  . albuterol (PROVENTIL HFA;VENTOLIN HFA) 108 (90 BASE) MCG/ACT inhaler Inhale 2 puffs into the lungs every 4 (four) hours as needed. For shortness of breath      . ALPRAZolam (XANAX) 0.5 MG tablet Take 0.5 mg by mouth at bedtime as needed. For anxiety      . carvedilol (COREG) 6.25 MG tablet Take 6.25 mg by mouth 2 (two) times daily with a meal.      . ferrous sulfate 325 (65 FE) MG tablet Take 325 mg by mouth daily with breakfast.      . furosemide (LASIX) 40 MG tablet Take 40 mg by mouth daily.      [name] HYDROcodone-acetaminophen (VICODIN) 5-500 MG per tablet Take 1 tablet by mouth every 6 (six) hours as needed. For pain      . Multiple Vitamin (MULITIVITAMIN WITH MINERALS) TABS Take 1 tablet by mouth daily.      . nitroGLYCERIN (NITRODUR - DOSED IN MG/24 HR) 0.1 mg/hr Place 0.5 patches onto the skin daily. Placed on right shoulder.      . nitroGLYCERIN (NITROSTAT) 0.4 MG SL tablet Place 0.4 mg under the tongue every 5 (five) minutes as needed. For chest pain      . Olopatadine HCl 0.2 % SOLN Apply 1 drop to eye daily as needed. For allergies      . omeprazole (PRILOSEC) 20 MG capsule Take 20 mg by mouth 2 (two) times daily.      . potassium chloride (K-DUR) 10 MEQ tablet Take 20 mEq by mouth daily.       . Probiotic Product (SUPER PROBIOTIC) CAPS Take 1 capsule by mouth daily.      . rosuvastatin (CRESTOR) 20 MG tablet Take 20 mg by mouth daily.      [name] senna (SENOKOT) 8.6 MG TABS Take 1 tablet by mouth at bedtime as needed. For constipation      . sucralfate (CARAFATE) 1 G tablet Take 1 g by mouth 2 (two) times daily.        [name] DISCONTD: albuterol (PROAIR HFA) 108 (90 BASE) MCG/ACT inhaler Inhale 2 puffs into the lungs every 4 (four) hours as needed for wheezing or shortness of breath.  two puffs every 4-6 hours as needed  1 Inhaler  2  . DISCONTD: ALPRAZolam (XANAX) 0.5 MG tablet Take 1 tablet (0.5 mg total) by mouth at bedtime as needed. For anxiety  30 tablet  2  . DISCONTD: carvedilol (COREG) 6.25 MG tablet Take 1 tablet (6.25 mg total) by mouth 2 (two) times daily with a meal.  60 tablet  6  . DISCONTD: omeprazole (PRILOSEC) 20 MG capsule Take 1 capsule (20 mg total) by mouth 2 (two) times daily.  60 capsule  3  . DISCONTD: potassium chloride (K-DUR) 10 MEQ tablet Take 2 tablets (20 mEq total) by mouth daily.  30 tablet  11  . DISCONTD: rosuvastatin (CRESTOR) 20 MG tablet Take 1 tablet (20 mg total) by mouth daily.  30 tablet  11  . DISCONTD: sucralfate (CARAFATE) 1 G tablet Take 1 tablet (1 g total) by mouth 2 (two) times daily.  60 tablet  1     (Not in a hospital admission)  Allergies  Allergen Reactions  . Ace Inhibitors     REACTION: angioedema    Review of Systems: All systems reviewed and are negative except as mentioned above in the history of present illness.    PHYSICAL EXAM: Filed Vitals:   05/03/12 0300  BP: 124/88  Pulse: 70  Temp:   Resp: 18   GENERAL: No acute distress.   HEENT: Normocephalic, atraumatic.  Oropharynx is pink and moist without lesions.  NECK: Supple, no LAD, no JVD, no masses. CV: Regular rate and rhythm with 1/6 SM at the base that doesn't increase with inspiration.     LUNGS: Clear to auscultation bilaterally.   ABDOMEN: +BS, soft, nontender, nondistended.  EXTREMITIES: No clubbing, cyanosis, or edema.   NEURO: AO x 3, no focal deficits. PYSCH: Normal affect. SKIN: No rashes.    ECG: AV paced.   Results for orders placed during the hospital encounter of 05/03/12 (from the past 24 hour(s))  CBC     Status: Abnormal   Collection Time   05/03/12  1:29 AM       Component Value Range   WBC 3.9 (*) 4.0 - 10.5 (K/uL)   RBC 3.71 (*) 3.87 - 5.11 (MIL/uL)   Hemoglobin 10.8 (*) 12.0 - 15.0 (g/dL)   HCT 32.2 (*) 36.0 - 46.0 (%)   MCV 86.8  78.0 - 100.0 (fL)   MCH 29.1  26.0 - 34.0 (pg)   MCHC 33.5  30.0 - 36.0 (g/dL)   RDW 13.6  11.5 - 15.5 (%)   Platelets 194  150 - 400 (K/uL)  DIFFERENTIAL     Status: Normal   Collection Time   05/03/12  1:29 AM      Component Value Range   Neutrophils Relative 49  43 - 77 (%)   Neutro Abs 1.9  1.7 - 7.7 (K/uL)   Lymphocytes Relative 36  12 - 46 (%)   Lymphs Abs 1.4  0.7 - 4.0 (K/uL)   Monocytes Relative 11  3 - 12 (%)   Monocytes Absolute 0.4  0.1 - 1.0 (K/uL)   Eosinophils Relative 3  0 - 5 (%)   Eosinophils Absolute 0.1  0.0 - 0.7 (K/uL)   Basophils Relative 1  0 - 1 (%)   Basophils Absolute 0.0  0.0 - 0.1 (K/uL)  PROTIME-INR     Status: Normal   Collection Time   05/03/12  1:29 AM      Component Value Range   Prothrombin Time  12.8  11.6 - 15.2 (seconds)   INR 0.94  0.00 - 1.49   POCT I-STAT TROPONIN I     Status: Abnormal   Collection Time   05/03/12  1:39 AM      Component Value Range   Troponin i, poc 0.13 (*) 0.00 - 0.08 (ng/mL)   Comment NOTIFIED PHYSICIAN     Comment 3           POCT I-STAT, CHEM 8     Status: Abnormal   Collection Time   05/03/12  1:45 AM      Component Value Range   Sodium 140  135 - 145 (mEq/L)   Potassium 4.1  3.5 - 5.1 (mEq/L)   Chloride 105  96 - 112 (mEq/L)   BUN 33 (*) 6 - 23 (mg/dL)   Creatinine, Ser 1.40 (*) 0.50 - 1.10 (mg/dL)   Glucose, Bld 113 (*) 70 - 99 (mg/dL)   Calcium, Ion 1.28  1.12 - 1.32 (mmol/L)   TCO2 27  0 - 100 (mmol/L)   Hemoglobin 10.9 (*) 12.0 - 15.0 (g/dL)   HCT 32.0 (*) 36.0 - 46.0 (%)   Dg Chest 2 View  05/03/2012  *RADIOLOGY REPORT*  Clinical Data: Shortness of breath, cough.  CHEST - 2 VIEW  Comparison: 11/03/2011  Findings: Cardiomegaly.  Right chest wall battery pack with lead tips projecting over the right atrium and right ventricle.   Central vascular congestion.  No overt edema.  No focal consolidation. No pleural effusion or pneumothorax.  No acute osseous finding.  IMPRESSION: Cardiomegaly without overt edema or focal consolidation.  Original Report Authenticated By: Suanne Marker, M.D.   ASSESSMENT and PLAN:  65 year old, African American female with PMH of HOCM, cardiac catheterization in 2009 demonstrating clean coronary arteries, who presents for evaluation of shortness of breath.  1.  Admit to our [facility] cardiology, Dr. Rayann Heman.  2. shortness of breath. Her symptoms are predominantly subjective and there is minimal objective evidence for her shortness of breath. The only abnormal finding on workup as an elevated troponin. 0.13. In looking back in the records it seems that her troponin is always in this range. I suspect that she always has a minimally elevated troponin because of her hypertrophic cardiomyopathy. Her shortness of breath could be coming from worsening diastolic heart failure and or HOCM.  Her palpitations could be from an arrhythmia. We will check an BNP, as well as an echocardiogram to see what her PASP is and if there is an LVOT gradient.  At this time, my suspicion for acute coronary syndrome is low and therefore, we will not use any anticoagulation. We will cycle cardiac enzymes and start on a full dose aspirin. We'll continue her home beta blocker and statin. However, we will increase the Coreg dose to 12.5 mg in the morning and keep her at 6.25 mg in the evening as some of her symptoms may be from HOCM.  She appears euvolemic, and we'll keep her on her home dose of Lasix. It may be beneficial to interrogate her device in the morning because of these episodes of palpitations, just ensure that she has not had any heart rate alarms.  3. Diastolic heart failure. This appears to be stable. We will check an echocardiogram is as above, as well as a BNP. She is euvolemic, and we'll keep her home dose of Lasix and  increase her beta blockers listed above.  4. Complete heart block status post pacemaker.  The EKG demonstrates  a normally functioning pacemaker.  Consider interrogation because first of his complaints of palpitations  5: FEN: SLIVF, Electrolytes are stable, NPO after midnight.    6: DVT prophylaxis: SCDs.   Shaune Pollack. Radford Pax, MD Level 3 H&P

## 2012-05-03 NOTE — Progress Notes (Signed)
Doing "better" this am.  She reports increased salt intake (Pizza) yesterday with subsequent SOB. I have reviewed her CXR.  On exam, minimal volume overload.  Continue medical therapy as outlined by Dr Radford Pax. I will have her pacemaker interrogated. Echo is pending.  She will ambulate in the halls today and hopefully may be able to go home tomorrow depending on results of the above.  [name] Yuleidy Rappleye,MD

## 2012-05-03 NOTE — ED Notes (Signed)
Pt complains of sob and hard to breath x 1 hour.

## 2012-05-03 NOTE — Progress Notes (Signed)
*  PRELIMINARY RESULTS* Echocardiogram 2D Echocardiogram has been performed.  Leavy Cella R 05/03/2012, 10:09 AM

## 2012-05-03 NOTE — ED Provider Notes (Signed)
History     CSN: [id number]  Arrival date & time 05/03/12  0000   First MD Initiated Contact with Patient 05/03/12 0100      Chief Complaint  Patient presents with  . Shortness of Breath    (Consider location/radiation/quality/duration/timing/severity/associated sxs/prior treatment) Patient is a 66 y.o. female presenting with shortness of breath. The history is provided by the patient. No language interpreter was used.  Shortness of Breath  The current episode started more than 1 week ago. The onset was gradual. The problem occurs occasionally. The problem has been gradually worsening. The problem is moderate. The symptoms are relieved by nothing. The symptoms are aggravated by activity. Associated symptoms include shortness of breath. Pertinent negatives include no sore throat and no stridor. Associated symptoms comments: Palpitations and nausea and tonight stinging arm pain. There was no intake of a foreign body. She was not exposed to toxic fumes. Her past medical history is significant for asthma. She has been behaving normally. Recently, medical care has been given by the PCP.    Past Medical History  Diagnosis Date  . Angioedema     2/2 ACE  . RLS (restless legs syndrome)     Dx 06/2007  . Anemia   . Arteriovenous malformation of stomach   . Permanent atrial fibrillation   . Renal failure     baseline creatinine 1.6  . Hyperlipidemia   . Depression   . Diastolic heart failure   . Arthritis   . Sleep apnea   . Asthma   . Hx of colonic polyp 08/13/10    adenomatous  . Fatty liver 07/26/10  . Panic attacks   . Hypertension   . Complete heart block     s/p PPM 1998  . Hypertrophic cardiomyopathy     dx by Dr [name] 2009  . Hx of colonoscopy   . GI bleed   . Panic attack   . DDD (degenerative disc disease)   . History of alcohol abuse Stopped Fall 2012  . History of tobacco use Quit Fall 2012  . Shortness of breath     sob on exertation  . CHF (congestive heart  failure)   . Angina   . GERD (gastroesophageal reflux disease)   . AVM (arteriovenous malformation) of colon     small intestine; stomach  . Right arm pain 01/08/2012    Past Surgical History  Procedure Date  . Tubal ligation 04/01/2000  . Pacemaker insertion [place], most recent gen change by [name] 4/12  . Cardiac catheterization   . Esophagogastroduodenoscopy 12/23/2011    Procedure: ESOPHAGOGASTRODUODENOSCOPY (EGD);  Surgeon: [name], MD;  Location: [facility];  Service: Endoscopy;  Laterality: N/A;  . Givens capsule study 12/23/2011    Procedure: GIVENS CAPSULE STUDY;  Surgeon: [name], MD;  Location: [facility];  Service: Endoscopy;  Laterality: N/A;    Family History  Problem Relation Age of Onset  . Hypertension Mother   . Stroke Mother   . Hypertension Father   . Stroke Father   . Hypertension Sister   . Hypertension Brother   . Colon cancer Brother   . Diabetes Sister   . Diabetes Brother   . Cancer Brother   . Heart attack Brother   . Heart attack Father   . Heart disease Mother   . Heart disease Sister   . Alcohol abuse Brother   . Alcohol abuse Father     History  Substance  Use Topics  . Smoking status: Former Smoker -- 0.1 packs/day    Types: Cigarettes    Quit date: 08/16/2011  . Smokeless tobacco: Never Used   Comment: quit x 3 months  . Alcohol Use: No    OB History    Grav Para Term Preterm Abortions TAB SAB Ect Mult Living                  Review of Systems  Constitutional: Negative for diaphoresis.  HENT: Negative for sore throat.   Respiratory: Positive for shortness of breath. Negative for stridor.   Cardiovascular: Positive for palpitations.  Gastrointestinal: Positive for nausea.  All other systems reviewed and are negative.    Allergies  Ace inhibitors  Home Medications   Current Outpatient Rx  Name Route Sig Dispense Refill  . ALBUTEROL SULFATE HFA 108 (90 BASE) MCG/ACT IN AERS Inhalation Inhale 2 puffs  into the lungs every 4 (four) hours as needed. For shortness of breath    . ALPRAZOLAM 0.5 MG PO TABS Oral Take 0.5 mg by mouth at bedtime as needed. For anxiety    . CARVEDILOL 6.25 MG PO TABS Oral Take 6.25 mg by mouth 2 (two) times daily with a meal.    . FERROUS SULFATE 325 (65 FE) MG PO TABS Oral Take 325 mg by mouth daily with breakfast.    . FUROSEMIDE 40 MG PO TABS Oral Take 40 mg by mouth daily.    [name] HYDROCODONE-ACETAMINOPHEN 5-500 MG PO TABS Oral Take 1 tablet by mouth every 6 (six) hours as needed. For pain    . ADULT MULTIVITAMIN W/MINERALS CH Oral Take 1 tablet by mouth daily.    [name] NITROGLYCERIN 0.1 MG/HR TD PT24 Transdermal Place 0.5 patches onto the skin daily. Placed on right shoulder.    [name] NITROGLYCERIN 0.4 MG SL SUBL Sublingual Place 0.4 mg under the tongue every 5 (five) minutes as needed. For chest pain    . OLOPATADINE HCL 0.2 % OP SOLN Ophthalmic Apply 1 drop to eye daily as needed. For allergies    . OMEPRAZOLE 20 MG PO CPDR Oral Take 20 mg by mouth 2 (two) times daily.    [name] POTASSIUM CHLORIDE ER 10 MEQ PO TBCR Oral Take 20 mEq by mouth daily.     . SUPER PROBIOTIC PO CAPS Oral Take 1 capsule by mouth daily.    [name] ROSUVASTATIN CALCIUM 20 MG PO TABS Oral Take 20 mg by mouth daily.    . SENNA 8.6 MG PO TABS Oral Take 1 tablet by mouth at bedtime as needed. For constipation    . SUCRALFATE 1 G PO TABS Oral Take 1 g by mouth 2 (two) times daily.      BP 117/73  Pulse 70  Temp(Src) 98 F (36.7 C) (Oral)  Resp 18  SpO2 100%  Physical Exam  Constitutional: She appears well-developed and well-nourished.  HENT:  Head: Normocephalic and atraumatic.  Mouth/Throat: Oropharynx is clear and moist.  Eyes: Conjunctivae are normal. Pupils are equal, round, and reactive to light.  Neck: Normal range of motion. Neck supple.  Cardiovascular: Normal rate and regular rhythm.   Pulmonary/Chest: Effort normal and breath sounds normal. She has no wheezes. She has no rales.  Abdominal:  Soft. Bowel sounds are normal. There is no tenderness. There is no rebound and no guarding.  Musculoskeletal: Normal range of motion.  Neurological: She is alert. She has normal reflexes.  Skin: Skin is warm and dry. She is not  diaphoretic.  Psychiatric: She has a normal mood and affect.    ED Course  Procedures (including critical care time)  Labs Reviewed  CBC - Abnormal; Notable for the following:    WBC 3.9 (*)    RBC 3.71 (*)    Hemoglobin 10.8 (*)    HCT 32.2 (*)    All other components within normal limits  POCT I-STAT, CHEM 8 - Abnormal; Notable for the following:    BUN 33 (*)    Creatinine, Ser 1.40 (*)    Glucose, Bld 113 (*)    Hemoglobin 10.9 (*)    HCT 32.0 (*)    All other components within normal limits  POCT I-STAT TROPONIN I - Abnormal; Notable for the following:    Troponin i, poc 0.13 (*)    All other components within normal limits  DIFFERENTIAL  PROTIME-INR   Dg Chest 2 View  05/03/2012  *RADIOLOGY REPORT*  Clinical Data: Shortness of breath, cough.  CHEST - 2 VIEW  Comparison: 11/03/2011  Findings: Cardiomegaly.  Right chest wall battery pack with lead tips projecting over the right atrium and right ventricle.  Central vascular congestion.  No overt edema.  No focal consolidation. No pleural effusion or pneumothorax.  No acute osseous finding.  IMPRESSION: Cardiomegaly without overt edema or focal consolidation.  Original Report Authenticated By: Suanne Marker, M.D.     No diagnosis found.    MDM   Date: 05/03/2012  Rate: 70  Rhythm: paced  QRS Axis: left  Intervals: QT prolonged  ST/T Wave abnormalities: normal  Conduction Disutrbances:prolonged QT  Narrative Interpretation:   Old EKG Reviewed: unchanged         Stelios Kirby K Audon Heymann-Rasch, MD 05/03/12 [phone]

## 2012-05-03 NOTE — Progress Notes (Signed)
Pt. Arrived to floor and settled into room.  Pt. Alert and oriented. Call light explained and pt. Stated she will call if she needs anything.  Pt. Concerned about receiving dose of aspirin due to her history of GI bleed.  Dr. Radford Pax paged and he stated it was ok for her to receive but if she didn't want to take it, she didn't have to.  Pt. Stated she will take aspirin.  Will continue to monitor patient.

## 2012-05-03 NOTE — ED Notes (Signed)
Received pt. From triage via w/c with c/o SOB x 1 hr. Pt reports intermitting heart flutters last several weeks

## 2012-05-03 NOTE — ED Notes (Signed)
Pt. Transported to the floor via stretcher with RN and cardiac monitor

## 2012-05-04 LAB — LIPID PANEL: Cholesterol: 149 mg/dL (ref 0–200)

## 2012-05-04 LAB — BASIC METABOLIC PANEL
BUN: 34 mg/dL — ABNORMAL HIGH (ref 6–23)
Chloride: 99 mEq/L (ref 96–112)
GFR calc Af Amer: 39 mL/min — ABNORMAL LOW (ref >90)
Glucose, Bld: 115 mg/dL — ABNORMAL HIGH (ref 70–99)
Potassium: 4.7 mEq/L (ref 3.5–5.1)

## 2012-05-04 MED ORDER — CARVEDILOL 6.25 MG PO TABS: 6.2500 mg | ORAL_TABLET | ORAL | Status: DC

## 2012-05-04 NOTE — Discharge Summary (Signed)
CARDIOLOGY DISCHARGE SUMMARY   Patient ID: [name] MRN: [id number] DOB/AGE: 68-13-1947 68 y.o.  Admit date: 05/03/2012 Discharge date: 05/04/2012  Primary Discharge Diagnosis:  DOE Secondary Discharge Diagnosis:  Past Medical History  Diagnosis Date  . Angioedema     2/2 ACE  . RLS (restless legs syndrome)     Dx 06/2007  . Anemia   . Arteriovenous malformation of stomach   . Permanent atrial fibrillation   . Renal failure     baseline creatinine 1.6  . Hyperlipidemia   . Depression   . Diastolic heart failure - chronic   . Arthritis   . Sleep apnea   . Asthma   . Hx of colonic polyp 08/13/10    adenomatous  . Fatty liver 07/26/10  . Panic attacks   . Hypertension   . Complete heart block     s/p PPM 1998  . Hypertrophic cardiomyopathy     dx by Dr [name] 2009  . Hx of colonoscopy   . GI bleed   . Panic attack   . DDD (degenerative disc disease)   . History of alcohol abuse Stopped Fall 2012  . History of tobacco use Quit Fall 2012  . Shortness of breath     sob on exertation  . CHF (congestive heart failure)   . Angina   . GERD (gastroesophageal reflux disease)   . AVM (arteriovenous malformation) of colon     small intestine; stomach  . Right arm pain 01/08/2012  . Hyperlipidemia   . Blood transfusion    Procedures: PPM interrogation  Hospital Course: [name] is a 66 year old female with a history of chronic diastolic heart failure and complete heart block status post pacemaker. She had increased dyspnea on exertion after her some dietary indiscretions and came to the hospital where she was admitted for further evaluation and treatment.  Her Coreg was increased for better BP control and because HOCM may have contribute to her dyspnea. Her chest x-ray did not show significant volume overload. Her BNP was not significantly elevated. An echocardiogram was checked and her PAS was essentially normal at 32. She has severe LVH and this was  unchanged. She is anemic but her hemoglobin is at baseline. Her renal function is abnormal but not significantly different from prior values. Her cardiac enzymes were cycled and were negative for MI. She was counseled about the importance of diet and medication compliance.  On 05/04/2012, [name] felt that her symptoms had improved. She was advised that it was acceptable to increase her activity level as long as she paced herself and did not overexert.   Labs:   Lab Results  Component Value Date   WBC 3.3* 05/03/2012   HGB 10.9* 05/03/2012   HCT 33.0* 05/03/2012   MCV 86.8 05/03/2012   PLT 180 05/03/2012     Basename 05/04/12 1200  NA 138  K 4.7  CL 99  CO2 27  BUN 34*  CREATININE 1.55*  GLU --    Basename 05/03/12 1725 05/03/12 1220 05/03/12 0910  CKTOTAL 91 85 77  CKMB 3.1 3.8 3.7  CKMBINDEX -- -- --  TROPONINI <0.30 <0.30 <0.30   Lipid Panel     Component Value Date/Time   CHOL 149 05/04/2012 0540   TRIG 162* 05/04/2012 0540   HDL 42 05/04/2012 0540   CHOLHDL 3.5 05/04/2012 0540   VLDL 32 05/04/2012 0540   LDLCALC 75 05/04/2012 0540    Pro B Natriuretic peptide (BNP)  Date/Time Value Range Status  05/03/2012  4:28 AM 1064.0* 0-125 (pg/mL) Final  10/05/2011 11:56 AM 912.2* 0-125 (pg/mL) Final    Basename 05/03/12 0129  INR 0.94      Radiology: Dg Chest 2 View 05/03/2012  *RADIOLOGY REPORT*  Clinical Data: Shortness of breath, cough.  CHEST - 2 VIEW  Comparison: 11/03/2011  Findings: Cardiomegaly.  Right chest wall battery pack with lead tips projecting over the right atrium and right ventricle.  Central vascular congestion.  No overt edema.  No focal consolidation. No pleural effusion or pneumothorax.  No acute osseous finding.  IMPRESSION: Cardiomegaly without overt edema or focal consolidation.  Original Report Authenticated By: Suanne Marker, M.D.   EKG: 03-May-2012 00:57:39 SINUS RHYTHM ~ normal P axis, V-rate 50- 99 Atrial-sensed ventricular-paced  rhythm Vent. rate 70 BPM PR interval 280 ms QRS duration 208 ms QT/QTc 524/565 ms P-R-T axes 86 -86 94  Echo: 05/04/2012  Study Conclusions - Left ventricle: LV is severely hypertrophied with mid cavity oblteration during systole. The cavity size was normal. Wall thickness was increased in a pattern of severe LVH. Systolic function was normal. The estimated ejection fraction was in the range of 60% to 65%. - Left atrium: The atrium was moderately dilated. - Pulmonary arteries: PA peak pressure: 79mm Hg (S).   FOLLOW UP PLANS AND APPOINTMENTS Discharge Orders    Future Appointments: Provider: Department: Dept Phone: Center:   05/20/2012 2:00 PM Thompson Grayer, [place] [phone] [id number]     Allergies  Allergen Reactions  . Ace Inhibitors     REACTION: angioedema   Medication List  As of 05/04/2012 11:40 AM   TAKE these medications         albuterol 108 (90 BASE) MCG/ACT inhaler   Commonly known as: PROVENTIL HFA;VENTOLIN HFA   Inhale 2 puffs into the lungs every 4 (four) hours as needed. For shortness of breath      ALPRAZolam 0.5 MG tablet   Commonly known as: XANAX   Take 0.5 mg by mouth at bedtime as needed. For anxiety      carvedilol 6.25 MG tablet   Commonly known as: COREG   Take 1 tablet (6.25 mg total) by mouth as directed. Take 2 tablets AM and 1 tablet PM.      ferrous sulfate 325 (65 FE) MG tablet   Take 325 mg by mouth daily with breakfast.      furosemide 40 MG tablet   Commonly known as: LASIX   Take 40 mg by mouth daily.      HYDROcodone-acetaminophen 5-500 MG per tablet   Commonly known as: VICODIN   Take 1 tablet by mouth every 6 (six) hours as needed. For pain      mulitivitamin with minerals Tabs   Take 1 tablet by mouth daily.      nitroGLYCERIN 0.4 MG SL tablet   Commonly known as: NITROSTAT   Place 0.4 mg under the tongue every 5 (five) minutes as needed. For chest pain      nitroGLYCERIN 0.1 mg/hr   Commonly known  as: NITRODUR - Dosed in mg/24 hr   Place 0.5 patches onto the skin daily. Placed on right shoulder.      Olopatadine HCl 0.2 % Soln   Apply 1 drop to eye daily as needed. For allergies      omeprazole 20 MG capsule   Commonly known as: PRILOSEC   Take 20 mg by mouth 2 (two) times daily.  potassium chloride 10 MEQ tablet   Commonly known as: K-DUR   Take 20 mEq by mouth daily.      rosuvastatin 20 MG tablet   Commonly known as: CRESTOR   Take 20 mg by mouth daily.      senna 8.6 MG Tabs   Commonly known as: SENOKOT   Take 1 tablet by mouth at bedtime as needed. For constipation      sucralfate 1 G tablet   Commonly known as: CARAFATE   Take 1 g by mouth 2 (two) times daily.      Super Probiotic Caps   Take 1 capsule by mouth daily.           Follow-up Information    Follow up with Thompson Grayer, MD. (June 6th at  2pm)    Contact information:   [place], [place] [phone]          BRING ALL MEDICATIONS WITH YOU TO FOLLOW UP APPOINTMENTS  Time spent with patient to include physician time: 45 min Signed: Rosaria Ferries 05/04/2012, 11:40 AM Co-Sign MD ' I have seen, examined the patient, and reviewed the above assessment and plan.   Co Sign: Thompson Grayer, MD 05/04/2012 8:24 PM

## 2012-05-04 NOTE — Progress Notes (Addendum)
Patient Name: [name] Date of Encounter: 05/04/2012   SUBJECTIVE: Pt states was placed on NitroDur patch for torn rotator cuff. She admits to dietary indiscretion and associates that with weight gain. Her dyspnea has improved but she is concerned about her chronic DOE. Left lower rib chest pain today. She has had that off/on for months.   OBJECTIVE Filed Vitals:   05/03/12 0532 05/03/12 1351 05/03/12 2153 05/04/12 0558  BP: 133/89 97/62 117/79 134/75  Pulse: 70 67 70 75  Temp: 97.9 F (36.6 C) 98.1 F (36.7 C) 98.7 F (37.1 C) 97.8 F (36.6 C)  TempSrc: Oral Oral Oral Oral  Resp: 18 18 20 20   Height: 5\' 5"  (1.651 m)     Weight: 195 lb 1.7 oz (88.5 kg)   191 lb 2.2 oz (86.7 kg)  SpO2: 100% 98% 99% 99%    Intake/Output Summary (Last 24 hours) at 05/04/12 1027 Last data filed at 05/04/12 0941  Gross per 24 hour  Intake    650 ml  Output    900 ml  Net   -250 ml   Weight change: -3 lb 15.5 oz (-1.8 kg) Filed Weights   05/03/12 0532 05/04/12 0558  Weight: 195 lb 1.7 oz (88.5 kg) 191 lb 2.2 oz (86.7 kg)     PHYSICAL EXAM General: Well developed, well nourished, female in no acute distress. Head: Normocephalic, atraumatic.  Neck: Supple without bruits, JVD at about 8 cm. Lungs:  Resp regular and unlabored, CTA bilaterally. Heart: RRR, S1, S2, no S3, S4, or murmur. Abdomen: Soft, non-tender, non-distended, BS + x 4.  Extremities: No clubbing, cyanosis, no edema.  Neuro: Alert and oriented X 3. Moves all extremities spontaneously. Psych: Normal affect.  LABS: CBC: Basename 05/03/12 0910 05/03/12 0145 05/03/12 0129  WBC 3.3* -- 3.9*  NEUTROABS 1.5* -- 1.9  HGB 10.9* 10.9* --  HCT 33.0* 32.0* --  MCV 86.8 -- 86.8  PLT 180 -- 194   INR: Basename 05/03/12 0129  INR [id number]   Basic Metabolic Panel: Basename 0000000 0910 05/03/12 0145  NA 136 140  K 4.2 4.1  CL 100 105  CO2 23 --  GLUCOSE 108* 113*  BUN 32* 33*  CREATININE 1.52* 1.40*  CALCIUM  9.8 --  MG -- --  PHOS -- --   Liver Function Tests: [facility] 05/03/12 0910  AST 19  ALT 11  ALKPHOS 54  BILITOT 0.3  PROT 7.1  ALBUMIN 3.7   Cardiac Enzymes: Basename 05/03/12 1725 05/03/12 1220 05/03/12 0910  CKTOTAL 91 85 77  CKMB 3.1 3.8 3.7  CKMBINDEX -- -- --  TROPONINI <0.30 <0.30 <0.30    Basename 05/03/12 0139  TROPIPOC 0.13*   BNP: Pro B Natriuretic peptide (BNP)  Date/Time Value Range Status  05/03/2012  4:28 AM 1064.0* 0-125 (pg/mL) Final  10/05/2011 11:56 AM 912.2* 0-125 (pg/mL) Final   Fasting Lipid Panel: Basename 05/04/12 0540  CHOL 149  HDL 42  LDLCALC 75  TRIG 162*  CHOLHDL 3.5  LDLDIRECT --    Echo: 05/04/2012 Study Conclusions - Left ventricle: LV is severely hypertrophied with mid cavity oblteration during systole. The cavity size was normal. Wall thickness was increased in a pattern of severe LVH. Systolic function was normal. The estimated ejection fraction was in the range of 60% to 65%. - Left atrium: The atrium was moderately dilated. - Pulmonary arteries: PA peak pressure: 71mm Hg (S).  TELE: Vpacing    Radiology/Studies: Dg Chest 2 View 05/03/2012  *RADIOLOGY  REPORT*  Clinical Data: Shortness of breath, cough.  CHEST - 2 VIEW  Comparison: 11/03/2011  Findings: Cardiomegaly.  Right chest wall battery pack with lead tips projecting over the right atrium and right ventricle.  Central vascular congestion.  No overt edema.  No focal consolidation. No pleural effusion or pneumothorax.  No acute osseous finding.  IMPRESSION: Cardiomegaly without overt edema or focal consolidation.  Original Report Authenticated By: Suanne Marker, M.D.    Current Medications:    . aspirin EC  325 mg Oral Daily  . atorvastatin  20 mg Oral q1800  . carvedilol  12.5 mg Oral Q breakfast  . carvedilol  6.25 mg Oral QPM  . ferrous sulfate  325 mg Oral Q breakfast  . furosemide  40 mg Oral Daily  . mulitivitamin with minerals  1 tablet Oral Daily  .  pantoprazole  40 mg Oral Q1200  . potassium chloride  20 mEq Oral Daily  . sucralfate  1 g Oral BID WC   HOME Medications Sig  albuterol (PROVENTIL HFA;VENTOLIN HFA) 108 (90 BASE) MCG/ACT inhaler Inhale 2 puffs into the lungs every 4 (four) hours as needed. For shortness of breath  ALPRAZolam (XANAX) 0.5 MG tablet Take 0.5 mg by mouth at bedtime as needed. For anxiety  carvedilol (COREG) 6.25 MG tablet Take 6.25 mg by mouth 2 (two) times daily with a meal.  ferrous sulfate 325 (65 FE) MG tablet Take 325 mg by mouth daily with breakfast.  furosemide (LASIX) 40 MG tablet Take 40 mg by mouth daily.  HYDROcodone-acetaminophen (VICODIN) 5-500 MG per tablet Take 1 tablet by mouth every 6 (six) hours as needed. For pain  Multiple Vitamin (MULITIVITAMIN WITH MINERALS) TABS Take 1 tablet by mouth daily.  nitroGLYCERIN (NITRODUR - DOSED IN MG/24 HR) 0.1 mg/hr Place 0.5 patches onto the skin daily. Placed on right shoulder.  nitroGLYCERIN (NITROSTAT) 0.4 MG SL tablet Place 0.4 mg under the tongue every 5 (five) minutes as needed. For chest pain  Olopatadine HCl 0.2 % SOLN Apply 1 drop to eye daily as needed. For allergies  omeprazole (PRILOSEC) 20 MG capsule Take 20 mg by mouth 2 (two) times daily.  potassium chloride (K-DUR) 10 MEQ tablet Take 20 mEq by mouth daily.   Probiotic Product (SUPER PROBIOTIC) CAPS Take 1 capsule by mouth daily.  rosuvastatin (CRESTOR) 20 MG tablet Take 20 mg by mouth daily.  senna (SENOKOT) 8.6 MG TABS Take 1 tablet by mouth at bedtime as needed. For constipation  sucralfate (CARAFATE) 1 G tablet Take 1 g by mouth 2 (two) times daily.     ASSESSMENT AND PLAN: Principal Problem:  *Dyspnea on exertion - Her volume status is at baseline. Her chronic dyspnea on exertion may be related to her hypertrophic obstructive cardiomyopathy. Discussed with her the need to pace herself and slowed down whenever she feels short of breath. Advised her that being active is encouraged as long  as she paces herself. 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 Have ordered for her oxygen saturation to be checked with ambulation to make sure she is not becoming hypoxic. Her beta blocker was increased on admission and she is tolerating this well.  Active Problems:  Chronic diastolic heart failure - Her volume status seems at baseline now. She admits to dietary indiscretion. Encouraged her to follow a low sodium diet, continue to track her weight and be compliant with her medications.   CHRONIC KIDNEY DISEASE  STAGE III (MODERATE) - her BUN is unchanged but her creatinine is up slightly last p.m. We'll recheck again today and continue to follow closely.   PACEMAKER - she is in atrial fibrillation but her pacemaker is functioning in her rate is controlled.  TORN ROTATOR CUFF - patient was started on a nitroglycerin patch for the torn rotator cuff in February 2013. She is to continue the medications she is taking for this and followup with primary care.  MEDS - Ms. Blackston-Polk takes her morning medications when she wakes up which is about 10 AM and takes her twice a day medications for the second time at about 4 PM. She takes one pill at bedtime. She was concerned about taking all of her medications at once as some should be taken with food and some should not. Advised her that if she wants to set up a third pill box in order to separate the medications that should/should not be taken with food, that would be okay but if the way she is doing it now is working, okay to continue.`  Plan: DC later today if she maintains oxygen saturation with ambulation and renal function is unchanged. Keep June appt.   Signed, Rosaria Ferries , PA-C 10:27 AM 05/04/2012   I have seen, examined the patient, and reviewed the above assessment and plan. Doing well today, SOB is improved.  Denies CP or SOB, now up to chair.  Exam as above,  JVP not elevated and lungs  are clear. Will plan to DC to home today. Stop nitroglycerine.  Continue to titrate coreg as able.  Resume home lasix 2 gram sodium restriction.  Follow-up with PA in 2 weeks,  Follow-up with me as previously scheduled.  Co Sign: Thompson Grayer, MD 05/04/2012 12:54 PM

## 2012-05-05 LAB — URINE CULTURE: Colony Count: 25000

## 2012-05-07 ENCOUNTER — Other Ambulatory Visit: Payer: Self-pay | Admitting: Family Medicine

## 2012-05-20 ENCOUNTER — Encounter: Payer: Self-pay | Admitting: Internal Medicine

## 2012-05-20 ENCOUNTER — Ambulatory Visit (INDEPENDENT_AMBULATORY_CARE_PROVIDER_SITE_OTHER): Payer: PRIVATE HEALTH INSURANCE | Admitting: Internal Medicine

## 2012-05-20 VITALS — BP 106/68 | HR 70 | Ht 66.0 in | Wt 194.4 lb

## 2012-05-20 DIAGNOSIS — D649 Anemia, unspecified: Secondary | ICD-10-CM

## 2012-05-20 DIAGNOSIS — I5033 Acute on chronic diastolic (congestive) heart failure: Secondary | ICD-10-CM

## 2012-05-20 DIAGNOSIS — I10 Essential (primary) hypertension: Secondary | ICD-10-CM

## 2012-05-20 DIAGNOSIS — I442 Atrioventricular block, complete: Secondary | ICD-10-CM

## 2012-05-20 DIAGNOSIS — E785 Hyperlipidemia, unspecified: Secondary | ICD-10-CM

## 2012-05-20 DIAGNOSIS — I4891 Unspecified atrial fibrillation: Secondary | ICD-10-CM

## 2012-05-20 LAB — CBC WITH DIFFERENTIAL/PLATELET
Basophils Absolute: 0 10*3/uL (ref 0.0–0.1)
Eosinophils Absolute: 0.1 10*3/uL (ref 0.0–0.7)
Lymphocytes Relative: 36.6 % (ref 12.0–46.0)
MCHC: 33.4 g/dL (ref 30.0–36.0)
Monocytes Absolute: 0.5 10*3/uL (ref 0.1–1.0)
Neutro Abs: 1.8 10*3/uL (ref 1.4–7.7)
Neutrophils Relative %: 47.4 % (ref 43.0–77.0)
RDW: 13.9 % (ref 11.5–14.6)

## 2012-05-20 LAB — PACEMAKER DEVICE OBSERVATION
BMOD-0002RV: 10
DEVICE MODEL PM: 2406848

## 2012-05-20 LAB — BASIC METABOLIC PANEL
Chloride: 105 mEq/L (ref 96–112)
Creatinine, Ser: 1.6 mg/dL — ABNORMAL HIGH (ref 0.4–1.2)
GFR: 40.02 mL/min — ABNORMAL LOW (ref >60.00)
Potassium: 4.7 mEq/L (ref 3.5–5.1)

## 2012-05-20 LAB — HEPATIC FUNCTION PANEL
ALT: 15 U/L (ref 0–35)
Bilirubin, Direct: 0.1 mg/dL (ref 0.0–0.3)
Total Bilirubin: 0.7 mg/dL (ref 0.3–1.2)

## 2012-05-20 NOTE — Patient Instructions (Signed)
Your physician recommends that you schedule a follow-up appointment in: 6 weeks with Truitt Merle, NP.  Your physician wants you to follow-up in: 6 months in the device clinic. You will receive a reminder letter in the mail two months in advance. If you don't receive a letter, please call our office to schedule the follow-up appointment.   Your physician wants you to follow-up in: 12 months with Dr. Rayann Heman.  You will receive a reminder letter in the mail two months in advance. If you don't receive a letter, please call our office to schedule the follow-up appointment.

## 2012-05-20 NOTE — Assessment & Plan Note (Signed)
Permanent afib Not a candidate for anticoagulation due to prior GI bleeds

## 2012-05-20 NOTE — Assessment & Plan Note (Signed)
Much improved Daily weights and salt restriction are encouraged No changes today

## 2012-05-20 NOTE — Assessment & Plan Note (Signed)
Stable No change required today  bmet today 

## 2012-05-20 NOTE — Assessment & Plan Note (Signed)
On crestor Check LFTs and CK today Given myalgias, we may have to consider a trial off of crestor if not improved upon return to follow-up with [name] In 6 weeks

## 2012-05-20 NOTE — Assessment & Plan Note (Signed)
Cbc today    She will follow up with Truitt Merle in 6 weeks.  I will see her again in 1 year (or sooner if needed).

## 2012-05-20 NOTE — Assessment & Plan Note (Signed)
Normal pacemaker function See Pace Art report No changes today  

## 2012-05-20 NOTE — Assessment & Plan Note (Signed)
bmet today 

## 2012-05-20 NOTE — Progress Notes (Signed)
Addended by: Norberta Keens on: 05/20/2012 02:52 PM   Modules accepted: Orders

## 2012-05-20 NOTE — Progress Notes (Signed)
PCP: OH PARK, [name] Dy, MD, [name]  The patient presents today for routine cardiology followup.  Since her recent discharge for diastolic CHF, the patient has done reasonably well.  Her SOB has significantly improved and her edema has resolved. Her concern today is with significant muscular pain in both arms and legs.  This has been present for several months.  Today, she denies symptoms of chest pain, shortness of breath, orthopnea, PND,presyncope, syncope, or neurologic sequela.  The patient feels that she is otherwise without complaint today.   Past Medical History  Diagnosis Date  . Angioedema     2/2 ACE  . RLS (restless legs syndrome)     Dx 06/2007  . Anemia   . Arteriovenous malformation of stomach   . Permanent atrial fibrillation   . Renal failure     baseline creatinine 1.6  . Hyperlipidemia   . Depression   . Diastolic heart failure   . Arthritis   . Sleep apnea   . Asthma   . Hx of colonic polyp 08/13/10    adenomatous  . Fatty liver 07/26/10  . Panic attacks   . Hypertension   . Complete heart block     s/p PPM 1998  . Hypertrophic cardiomyopathy     dx by Dr [name] 2009  . Hx of colonoscopy   . GI bleed   . Panic attack   . DDD (degenerative disc disease)   . History of alcohol abuse Stopped Fall 2012  . History of tobacco use Quit Fall 2012  . Shortness of breath     sob on exertation  . CHF (congestive heart failure)   . Angina   . GERD (gastroesophageal reflux disease)   . AVM (arteriovenous malformation) of colon     small intestine; stomach  . Right arm pain 01/08/2012  . Hyperlipidemia   . Blood transfusion    Past Surgical History  Procedure Date  . Tubal ligation 04/01/2000  . Pacemaker insertion [place], most recent gen change by [name] 4/12  . Cardiac catheterization   . Esophagogastroduodenoscopy 12/23/2011    Procedure: ESOPHAGOGASTRODUODENOSCOPY (EGD);  Surgeon: [name], [name];  Location: [facility];  Service: Endoscopy;  Laterality:  N/A;  . Givens capsule study 12/23/2011    Procedure: GIVENS CAPSULE STUDY;  Surgeon: [name], [name];  Location: [facility];  Service: Endoscopy;  Laterality: N/A;    Current Outpatient Prescriptions  Medication Sig Dispense Refill  . albuterol (PROVENTIL HFA;VENTOLIN HFA) 108 (90 BASE) MCG/ACT inhaler Inhale 2 puffs into the lungs every 4 (four) hours as needed. For shortness of breath       . ALPRAZolam (XANAX) 0.5 MG tablet Take 0.5 mg by mouth at bedtime as needed. For anxiety       . aspirin 81 MG tablet Take 81 mg by mouth daily.      . carvedilol (COREG) 6.25 MG tablet Take 1 tablet (6.25 mg total) by mouth as directed. Take 2 tablets AM and 1 tablet PM.  75 tablet  11  . Coenzyme Q10 (COQ10) 100 MG CAPS Take 1 capsule by mouth as needed.      . ferrous sulfate 325 (65 FE) MG tablet TAKE 1 TABLET BY MOUTH TWICE DAILY WITH MEALS (TAKE TO KEEP YOUR BLOOD COUNTS UP)  60 tablet  1  . furosemide (LASIX) 40 MG tablet Take 40 mg by mouth daily.      [name] HYDROcodone-acetaminophen (VICODIN) 5-500 MG per tablet  Take 1 tablet by mouth every 6 (six) hours as needed. Take 1/2 tablet. For pain      . Multiple Vitamin (MULITIVITAMIN WITH MINERALS) TABS Take 1 tablet by mouth daily.      . Olopatadine HCl 0.2 % SOLN Apply 1 drop to eye daily as needed. For allergies      . omeprazole (PRILOSEC) 20 MG capsule Take 20 mg by mouth 2 (two) times daily.      . potassium chloride (K-DUR) 10 MEQ tablet Take 20 mEq by mouth daily.       . rosuvastatin (CRESTOR) 20 MG tablet Take 20 mg by mouth daily.      . sucralfate (CARAFATE) 1 G tablet Take 1 g by mouth 2 (two) times daily.      [name] DISCONTD: sucralfate (CARAFATE) 1 G tablet TAKE 1 TABLET BY MOUTH TWICE DAILY  60 tablet  1   Current Facility-Administered Medications  Medication Dose Route Frequency Provider Last Rate Last Dose  . cyanocobalamin ((VITAMIN B-12)) injection 1,000 mcg  1,000 mcg Intramuscular Q30 days [name], [name]   1,000 mcg at 09/16/11  1341  . cyanocobalamin ((VITAMIN B-12)) injection 1,000 mcg  1,000 mcg Intramuscular Q30 days [name], [name]   1,000 mcg at 04/05/12 1402    Allergies  Allergen Reactions  . Ace Inhibitors     REACTION: angioedema    History   Social History  . Marital Status: Divorced    Spouse Name: N/A    Number of Children: 67  . Years of Education: N/A   Occupational History  . disabled    Social History Main Topics  . Smoking status: Former Smoker -- 0.1 packs/day for 50 years    Types: Cigarettes    Quit date: 08/16/2011  . Smokeless tobacco: Never Used   Comment: quit x 3 months  . Alcohol Use: No     quit 8 mos ago  . Drug Use: No  . Sexually Active: Not on file   Other Topics Concern  . Not on file   Social History Narrative   On disability for heart failure/pacemaker.  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 4 grown children,  8 grandchildren.  Drinks alcohol a few times a week socially. At one time, the most she reports drinking is about 3 mixed drinks.Lives with daughter [name] and son. Will be moving 04/2011 to different house because concerned children are taking advantage of her financial situation. No illicit drugs.Smokes few cigarettes daily, especially when stressed. Started smoking @ age 65. Quit January 2012 2/2 health but started smoking again 02/2011.     Family History  Problem Relation Age of Onset  . Hypertension Mother   . Stroke Mother   . Hypertension Father   . Stroke Father   . Hypertension Sister   . Hypertension Brother   . Colon cancer Brother   . Diabetes Sister   . Diabetes Brother   . Cancer Brother   . Heart attack Brother   . Heart attack Father   . Heart disease Mother   . Heart disease Sister   . Alcohol abuse Brother   . Alcohol abuse Father     ROS-  All systems are reviewed and are negative except as outlined in the HPI above    Physical Exam: Filed Vitals:   05/20/12 1410  BP: 106/68  Pulse: 70  Height:  5\' 6"  (1.676 m)  Weight: 194 lb 6.4 oz (88.179 kg)  GEN- The patient is well appearing, alert and oriented x 3 today.   Head- normocephalic, atraumatic Eyes-  Sclera clear, conjunctiva pink Ears- hearing intact Oropharynx- clear Neck- supple, no JVP Lymph- no cervical lymphadenopathy Lungs- Clear to ausculation bilaterally, normal work of breathing Chest- pacemaker pocket is well healed Heart- Regular rate and rhythm (paced) GI- soft, NT, ND, + BS Extremities- no clubbing, cyanosis, or edema MS- no significant deformity or atrophy Skin- no rash or lesion Psych- euthymic mood, full affect Neuro- strength and sensation are intact  Pacemaker interrogation- reviewed in detail today,  See PACEART report  Assessment and Plan:

## 2012-05-21 LAB — CK TOTAL AND CKMB (NOT AT ARMC)
CK, MB: 3.5 ng/mL (ref 0.3–4.0)
Total CK: 94 U/L (ref 7–177)

## 2012-05-27 ENCOUNTER — Telehealth: Payer: Self-pay | Admitting: *Deleted

## 2012-05-27 NOTE — Telephone Encounter (Signed)
Message copied by Hulan Saas on Thu May 27, 2012  8:34 AM ------      Message from: Hulan Saas      Created: Mon Apr 05, 2012  4:13 PM       Call and remind patient due for cbc on 05/31/12

## 2012-05-27 NOTE — Telephone Encounter (Signed)
Spoke with patient and reminded her labs are due next week for Dr. [name].

## 2012-05-28 ENCOUNTER — Telehealth: Payer: Self-pay | Admitting: *Deleted

## 2012-05-28 DIAGNOSIS — D649 Anemia, unspecified: Secondary | ICD-10-CM

## 2012-05-28 NOTE — Telephone Encounter (Signed)
Please have pt get Iron panel To determine if she needs an iron infusion. Continue Oral iron. ----- Message ----- From: Dionicio Stall, RN Sent: 05/28/2012 11:23 AM To: [name], MD   Labs in [facility].  Patient aware to come for labs next week.

## 2012-06-01 ENCOUNTER — Other Ambulatory Visit: Payer: Self-pay | Admitting: *Deleted

## 2012-06-01 ENCOUNTER — Other Ambulatory Visit (INDEPENDENT_AMBULATORY_CARE_PROVIDER_SITE_OTHER): Payer: PRIVATE HEALTH INSURANCE

## 2012-06-01 DIAGNOSIS — D649 Anemia, unspecified: Secondary | ICD-10-CM

## 2012-06-01 LAB — CBC WITH DIFFERENTIAL/PLATELET
Basophils Relative: 0.5 % (ref 0.0–3.0)
Eosinophils Relative: 5.9 % — ABNORMAL HIGH (ref 0.0–5.0)
Lymphocytes Relative: 30.4 % (ref 12.0–46.0)
MCV: 88.2 fl (ref 78.0–100.0)
Monocytes Relative: 9.9 % (ref 3.0–12.0)
Neutrophils Relative %: 53.3 % (ref 43.0–77.0)
Platelets: 196 10*3/uL (ref 150.0–400.0)
RBC: 3.39 Mil/uL — ABNORMAL LOW (ref 3.87–5.11)
WBC: 3.4 10*3/uL — ABNORMAL LOW (ref 4.5–10.5)

## 2012-06-01 LAB — IBC PANEL
Iron: 81 ug/dL (ref 42–145)
Saturation Ratios: 23.9 % (ref 20.0–50.0)
Transferrin: 242 mg/dL (ref 212.0–360.0)

## 2012-06-02 ENCOUNTER — Other Ambulatory Visit: Payer: Self-pay | Admitting: *Deleted

## 2012-06-02 DIAGNOSIS — D649 Anemia, unspecified: Secondary | ICD-10-CM

## 2012-06-08 ENCOUNTER — Other Ambulatory Visit: Payer: Self-pay | Admitting: Family Medicine

## 2012-06-16 ENCOUNTER — Other Ambulatory Visit: Payer: Self-pay | Admitting: Family Medicine

## 2012-06-21 ENCOUNTER — Other Ambulatory Visit: Payer: Self-pay | Admitting: Family Medicine

## 2012-06-21 NOTE — Telephone Encounter (Signed)
Pt has an appt for 7/24 and needs enough xanax to last until appt. [facility] [employer]

## 2012-06-21 NOTE — Telephone Encounter (Signed)
Will forward this message to Dr. Verdie Drown.

## 2012-06-22 MED ORDER — ALPRAZOLAM 0.5 MG PO TABS: 0.5000 mg | ORAL_TABLET | Freq: Every evening | ORAL | Status: DC | PRN

## 2012-06-22 NOTE — Telephone Encounter (Signed)
Denise: Will you please notify her that Rx was sent in. And that next time, I will not do this. She has to call before her medication runs out because this is a controlled substance.   Blue team: Will you please call in Xanax for her? #[phone]. No refills. Thank you.

## 2012-06-22 NOTE — Telephone Encounter (Signed)
Rx called in and pt informed. Fleeger, Salome Spotted

## 2012-06-28 ENCOUNTER — Other Ambulatory Visit (INDEPENDENT_AMBULATORY_CARE_PROVIDER_SITE_OTHER): Payer: PRIVATE HEALTH INSURANCE

## 2012-06-28 ENCOUNTER — Encounter: Payer: Self-pay | Admitting: Nurse Practitioner

## 2012-06-28 ENCOUNTER — Ambulatory Visit (INDEPENDENT_AMBULATORY_CARE_PROVIDER_SITE_OTHER): Payer: PRIVATE HEALTH INSURANCE | Admitting: Nurse Practitioner

## 2012-06-28 VITALS — BP 108/74 | HR 76 | Ht 66.0 in | Wt 196.2 lb

## 2012-06-28 DIAGNOSIS — R300 Dysuria: Secondary | ICD-10-CM

## 2012-06-28 DIAGNOSIS — D649 Anemia, unspecified: Secondary | ICD-10-CM

## 2012-06-28 DIAGNOSIS — E785 Hyperlipidemia, unspecified: Secondary | ICD-10-CM

## 2012-06-28 DIAGNOSIS — I5032 Chronic diastolic (congestive) heart failure: Secondary | ICD-10-CM

## 2012-06-28 DIAGNOSIS — Z950 Presence of cardiac pacemaker: Secondary | ICD-10-CM

## 2012-06-28 LAB — CBC WITH DIFFERENTIAL/PLATELET
Basophils Absolute: 0 10*3/uL (ref 0.0–0.1)
Eosinophils Relative: 3.4 % (ref 0.0–5.0)
HCT: 28.6 % — ABNORMAL LOW (ref 36.0–46.0)
Hemoglobin: 9.6 g/dL — ABNORMAL LOW (ref 12.0–15.0)
Lymphs Abs: 1.2 10*3/uL (ref 0.7–4.0)
MCV: 89.6 fl (ref 78.0–100.0)
Monocytes Absolute: 0.5 10*3/uL (ref 0.1–1.0)
Monocytes Relative: 11.9 % (ref 3.0–12.0)
Neutro Abs: 2.3 10*3/uL (ref 1.4–7.7)
Platelets: 233 10*3/uL (ref 150.0–400.0)
RDW: 13.9 % (ref 11.5–14.6)

## 2012-06-28 LAB — URINALYSIS, ROUTINE W REFLEX MICROSCOPIC
Bilirubin Urine: NEGATIVE
Hgb urine dipstick: NEGATIVE
Ketones, ur: NEGATIVE
Nitrite: NEGATIVE
Specific Gravity, Urine: 1.015 (ref 1.000–1.030)
Total Protein, Urine: NEGATIVE
Urine Glucose: NEGATIVE
Urobilinogen, UA: 0.2 (ref 0.0–1.0)
pH: 6 (ref 5.0–8.0)

## 2012-06-28 NOTE — Progress Notes (Signed)
[name] Date of Birth: 1946-11-14 Medical Record [id number]  History of Present Illness: [name] is seen back today for a 6 week check. She is seen for Dr. Rayann Heman. She has a hypertrophic CM with diastolic CHF, complete heart block with pacemaker in place, chronic atrial fib and not a candidate for coumadin due to gastric AVMs with past GI bleeding. Other problems include restless leg syndrome, anemia, CKD, HLD, depression, OA, sleep apnea, asthma, colonic polyps, fatty liver, panic attacks, and depression.   She comes in today. She is here alone. Not really having any cardiac issues. Denies chest pain. Says her breathing is ok. No swelling. No cough. She continues to have some soreness over her pacemaker and in her right arm. Last CXR was in May and was ok. This seems to be a chronic complaint. She did suffer a fall about a year ago and fell on that shoulder. Her legs continue to ache. She thinks it is due to her medicines. Her urine smells bad and she noted some blood in her underwear. Sees her PCP later this month.   Current Outpatient Prescriptions on File Prior to Visit  Medication Sig Dispense Refill  . albuterol (PROVENTIL HFA;VENTOLIN HFA) 108 (90 BASE) MCG/ACT inhaler Inhale 2 puffs into the lungs every 4 (four) hours as needed. For shortness of breath       . ALPRAZolam (XANAX) 0.5 MG tablet Take 1 tablet (0.5 mg total) by mouth at bedtime as needed. For anxiety  15 tablet  0  . aspirin 81 MG tablet Take 81 mg by mouth daily.      . carvedilol (COREG) 6.25 MG tablet Take 1 tablet (6.25 mg total) by mouth as directed. Take 2 tablets AM and 1 tablet PM.  75 tablet  11  . Coenzyme Q10 (COQ10) 100 MG CAPS Take 1 capsule by mouth as needed.      . ferrous sulfate 325 (65 FE) MG tablet TAKE 1 TABLET BY MOUTH TWICE DAILY WITH MEALS (TAKE TO KEEP YOUR BLOOD COUNTS UP)  60 tablet  1  . furosemide (LASIX) 40 MG tablet Take 40 mg by mouth daily.      [name] HYDROcodone-acetaminophen  (VICODIN) 5-500 MG per tablet Take 1 tablet by mouth every 6 (six) hours as needed. Take 1/2 tablet. For pain      . Multiple Vitamin (MULITIVITAMIN WITH MINERALS) TABS Take 1 tablet by mouth daily.      . Olopatadine HCl 0.2 % SOLN Apply 1 drop to eye daily as needed. For allergies      . omeprazole (PRILOSEC) 20 MG capsule Take 20 mg by mouth 2 (two) times daily.      . potassium chloride (K-DUR) 10 MEQ tablet Take 20 mEq by mouth daily.       . rosuvastatin (CRESTOR) 20 MG tablet Take 20 mg by mouth daily.      . sucralfate (CARAFATE) 1 G tablet Take 1 g by mouth 2 (two) times daily.       Current Facility-Administered Medications on File Prior to Visit  Medication Dose Route Frequency Provider Last Rate Last Dose  . cyanocobalamin ((VITAMIN B-12)) injection 1,000 mcg  1,000 mcg Intramuscular Q30 days [name], MD   1,000 mcg at 09/16/11 1341  . cyanocobalamin ((VITAMIN B-12)) injection 1,000 mcg  1,000 mcg Intramuscular Q30 days [name], MD   1,000 mcg at 04/05/12 1402    Allergies  Allergen Reactions  . Ace Inhibitors  REACTION: angioedema    Past Medical History  Diagnosis Date  . Angioedema     2/2 ACE  . RLS (restless legs syndrome)     Dx 06/2007  . Anemia   . Arteriovenous malformation of stomach   . Permanent atrial fibrillation   . Renal failure     baseline creatinine 1.6  . Hyperlipidemia   . Depression   . Diastolic heart failure   . Arthritis   . Sleep apnea   . Asthma   . Hx of colonic polyp 08/13/10    adenomatous  . Fatty liver 07/26/10  . Panic attacks   . Hypertension   . Complete heart block     s/p PPM 1998  . Hypertrophic cardiomyopathy     dx by Dr [name] 2009  . Hx of colonoscopy   . GI bleed   . Panic attack   . DDD (degenerative disc disease)   . History of alcohol abuse Stopped Fall 2012  . History of tobacco use Quit Fall 2012  . Shortness of breath     sob on exertation  . CHF (congestive heart failure)   . Angina   .  GERD (gastroesophageal reflux disease)   . AVM (arteriovenous malformation) of colon     small intestine; stomach  . Right arm pain 01/08/2012  . Hyperlipidemia   . Blood transfusion     Past Surgical History  Procedure Date  . Tubal ligation 04/01/2000  . Pacemaker insertion [place], most recent gen change by [name] 4/12  . Cardiac catheterization   . Esophagogastroduodenoscopy 12/23/2011    Procedure: ESOPHAGOGASTRODUODENOSCOPY (EGD);  Surgeon: [name], MD;  Location: [facility];  Service: Endoscopy;  Laterality: N/A;  . Givens capsule study 12/23/2011    Procedure: GIVENS CAPSULE STUDY;  Surgeon: [name], MD;  Location: [facility];  Service: Endoscopy;  Laterality: N/A;    History  Smoking status  . Former Smoker -- 0.1 packs/day for 50 years  . Types: Cigarettes  . Quit date: 08/16/2011  Smokeless tobacco  . Never Used  Comment: quit x 3 months    History  Alcohol Use No    quit 8 mos ago    Family History  Problem Relation Age of Onset  . Hypertension Mother   . Stroke Mother   . Hypertension Father   . Stroke Father   . Hypertension Sister   . Hypertension Brother   . Colon cancer Brother   . Diabetes Sister   . Diabetes Brother   . Cancer Brother   . Heart attack Brother   . Heart attack Father   . Heart disease Mother   . Heart disease Sister   . Alcohol abuse Brother   . Alcohol abuse Father     Review of Systems: The review of systems is per the HPI.  All other systems were reviewed and are negative.  Physical Exam: BP 108/74  Pulse 76  Ht 5\' 6"  (1.676 m)  Wt 196 lb 3.2 oz (88.996 kg)  BMI 31.67 kg/m2 Patient is pleasant and in no acute distress. She is obese. Skin is warm and dry. Color is normal.  HEENT is unremarkable. Normocephalic/atraumatic. PERRL. Sclera are nonicteric. Neck is supple. No masses. No JVD. Lungs are clear. Cardiac exam shows a fairly regular rate and rhythm (presumed paced). Pacemaker site in the right  upper chest looks ok. No signs of infection. Abdomen is obese but soft. Extremities are  without edema. Gait and ROM are intact. No gross neurologic deficits noted.   LABORATORY DATA:  Lab Results  Component Value Date   WBC 3.4* 06/01/2012   HGB 10.0* 06/01/2012   HCT 29.9* 06/01/2012   PLT 196.0 06/01/2012   GLUCOSE 91 05/20/2012   CHOL 149 05/04/2012   TRIG 162* 05/04/2012   HDL 42 05/04/2012   LDLDIRECT 94 09/10/2011   LDLCALC 75 05/04/2012   ALT 15 05/20/2012   AST 25 05/20/2012   NA 143 05/20/2012   K 4.7 05/20/2012   CL 105 05/20/2012   CREATININE 1.6* 05/20/2012   BUN 38* 05/20/2012   CO2 30 05/20/2012   TSH 4.212 02/08/2011   INR 0.94 05/03/2012   HGBA1C 5.8* 10/07/2011    Assessment / Plan:

## 2012-06-28 NOTE — Assessment & Plan Note (Signed)
She is complaining of aching in her legs. This seems to be a chronic complaint. I have stopped her Crestor for 2 weeks. If she has improvement she will call [facility] and let us known. If she does not improve, she needs to resume her Crestor.

## 2012-06-28 NOTE — Patient Instructions (Signed)
We are going to check a urinalysis today  You stop the Crestor for the next 2 weeks. If the aching in your legs gets better, call Claiborne Billings (Dr. Jackalyn Lombard nurse) and let her know and we will switch you to something else. If the aching does not go away, go back on your Crestor.  Stay on your other medicines.  Watch your salt and try to stay active.  We will see you in 4 months.  Call the [facility] office at [phone] if you have any questions, problems or concerns.

## 2012-06-28 NOTE — Assessment & Plan Note (Signed)
Has follow up planned with Dr. [name].

## 2012-06-28 NOTE — Assessment & Plan Note (Signed)
She looks fairly compensated today. No change in her current therapy. Salt restriction is advised. We will be checking a urinalysis today due to questionable hematuria and odor. Will plan on seeing her back in 4 months. Patient is agreeable to this plan and will call if any problems develop in the interim.

## 2012-06-28 NOTE — Assessment & Plan Note (Signed)
She is followed in pacer clinic.

## 2012-06-30 ENCOUNTER — Other Ambulatory Visit: Payer: Self-pay | Admitting: Internal Medicine

## 2012-06-30 MED ORDER — POTASSIUM CHLORIDE ER 10 MEQ PO TBCR: 20.0000 meq | EXTENDED_RELEASE_TABLET | Freq: Every day | ORAL | Status: DC

## 2012-07-07 ENCOUNTER — Ambulatory Visit (INDEPENDENT_AMBULATORY_CARE_PROVIDER_SITE_OTHER): Payer: PRIVATE HEALTH INSURANCE | Admitting: Family Medicine

## 2012-07-07 ENCOUNTER — Encounter: Payer: Self-pay | Admitting: Family Medicine

## 2012-07-07 ENCOUNTER — Other Ambulatory Visit (HOSPITAL_COMMUNITY)
Admission: RE | Admit: 2012-07-07 | Discharge: 2012-07-07 | Disposition: A | Discharge status: Home / Self Care | Payer: PRIVATE HEALTH INSURANCE | Source: Ambulatory Visit | Attending: Family Medicine | Admitting: Family Medicine

## 2012-07-07 VITALS — BP 93/62 | HR 91 | Temp 98.7°F | Ht 66.0 in | Wt 196.0 lb

## 2012-07-07 DIAGNOSIS — I5032 Chronic diastolic (congestive) heart failure: Secondary | ICD-10-CM

## 2012-07-07 DIAGNOSIS — D649 Anemia, unspecified: Secondary | ICD-10-CM

## 2012-07-07 DIAGNOSIS — Z124 Encounter for screening for malignant neoplasm of cervix: Secondary | ICD-10-CM

## 2012-07-07 DIAGNOSIS — N898 Other specified noninflammatory disorders of vagina: Secondary | ICD-10-CM

## 2012-07-07 DIAGNOSIS — N760 Acute vaginitis: Secondary | ICD-10-CM

## 2012-07-07 DIAGNOSIS — R42 Dizziness and giddiness: Secondary | ICD-10-CM

## 2012-07-07 DIAGNOSIS — I5033 Acute on chronic diastolic (congestive) heart failure: Secondary | ICD-10-CM

## 2012-07-07 DIAGNOSIS — Q2730 Arteriovenous malformation, site unspecified: Secondary | ICD-10-CM

## 2012-07-07 DIAGNOSIS — Z01419 Encounter for gynecological examination (general) (routine) without abnormal findings: Secondary | ICD-10-CM | POA: Insufficient documentation

## 2012-07-07 DIAGNOSIS — N949 Unspecified condition associated with female genital organs and menstrual cycle: Secondary | ICD-10-CM

## 2012-07-07 DIAGNOSIS — Q279 Congenital malformation of peripheral vascular system, unspecified: Secondary | ICD-10-CM

## 2012-07-07 LAB — POCT WET PREP (WET MOUNT): Clue Cells Wet Prep Whiff POC: NEGATIVE

## 2012-07-07 MED ORDER — ESTRADIOL 0.1 MG/GM VA CREA: TOPICAL_CREAM | VAGINAL | Status: DC

## 2012-07-07 MED ORDER — FUROSEMIDE 20 MG PO TABS: 20.0000 mg | ORAL_TABLET | Freq: Every day | ORAL | Status: DC

## 2012-07-07 MED ORDER — ROSUVASTATIN CALCIUM 20 MG PO TABS: 20.0000 mg | ORAL_TABLET | Freq: Every day | ORAL | Status: DC

## 2012-07-07 MED ORDER — SUCRALFATE 1 G PO TABS: 1.0000 g | ORAL_TABLET | Freq: Two times a day (BID) | ORAL | Status: DC

## 2012-07-07 MED ORDER — CARVEDILOL 6.25 MG PO TABS: 6.2500 mg | ORAL_TABLET | ORAL | Status: DC

## 2012-07-07 MED ORDER — OMEPRAZOLE 20 MG PO CPDR: 20.0000 mg | DELAYED_RELEASE_CAPSULE | Freq: Two times a day (BID) | ORAL | Status: DC

## 2012-07-07 NOTE — Assessment & Plan Note (Signed)
This is being managed by Dr. [name].  Anemia may be contributing to lightheadedness.

## 2012-07-07 NOTE — Assessment & Plan Note (Signed)
Stable. Hgb has dropped slightly but no obvious signs of GIB. Given refills on PPI bid and sucralfate. Will need to discuss with Dr. [name] before giving any more refills about the long-term use and frequency of these medications.

## 2012-07-07 NOTE — Assessment & Plan Note (Deleted)
Persistent. Her Hgb is low on re-check but iron studies appear normal.  Will try halving Lasix to help with blood pressure and to see if this helps lightheadedness. Patient advised to monitor weights and call if >5 lb weight gain before follow-up in 2 weeks.

## 2012-07-07 NOTE — Assessment & Plan Note (Signed)
She is above estimated dry weight. Her blood pressures are chronically low, and she has occasional what appears to be orthostatic hypotension that lasts a few seconds with position changes.  Considered halving Lasix, however, she is above her estimated dry weight.  Will continue Lasix at current dose for now.

## 2012-07-07 NOTE — Progress Notes (Signed)
  Subjective:    Patient ID: [name], female    DOB: 1946-11-05, 66 y.o.   MRN: [id number]  HPI # History of chronic diastolic HF (EF 0000000), atrial fibrillation (not warfarin candidate due to history of GIB), pacemaker history of hypertension  She feels lightheaded sometimes when she gets up that resolves after a few seconds Denies chest pain or difficulty breathing or heart palpitations  # Anemia Followed by Dr. [name] Taking iron  # Vaginal odor Not sexually active.  She tried putting in tampon, but stopped because started having some spotting. Denies irritation, dysuria  Review of Systems Per HPI Denies constipation/nausea/abdominal pain   Allergies, medication, past medical history reviewed.     Objective:   Physical Exam GEN: NAD; well-appearing; obese CV: RRR, no m/r/g PULM: NI WOB; CTAB without w/r/r EXT: no edema ABD: NABS, soft, NT, distended SKIN: warm, dry, 3-4 sec cap refill GU: normal vulva; vagina dry and atrophic; no odor; cervix without lesions; no discharge     Assessment & Plan:

## 2012-07-07 NOTE — Patient Instructions (Addendum)
I just re-filled your Xanax on 07/09 for 15 tablets. This should last you for another 3 months.  Follow-up within 3 months for more refills on your Xanax.   Make an appointment at the [place] for a home visit (FRONT DESK: schedule for 10:30-11:00 am time slot) in 2 weeks.   Try vaginal cream as directed.  Follow-up in 2 months if your symptoms are worse or persistent. If your lab results are normal, I will send you a letter with the results. If abnormal, someone at the clinic will get in touch with you.

## 2012-07-08 ENCOUNTER — Other Ambulatory Visit: Payer: PRIVATE HEALTH INSURANCE

## 2012-07-08 DIAGNOSIS — I5032 Chronic diastolic (congestive) heart failure: Secondary | ICD-10-CM

## 2012-07-08 DIAGNOSIS — N898 Other specified noninflammatory disorders of vagina: Secondary | ICD-10-CM | POA: Insufficient documentation

## 2012-07-08 NOTE — Assessment & Plan Note (Signed)
No odor detected. But vaginal atrophy noted. Try estrogen cream.

## 2012-07-09 ENCOUNTER — Encounter: Payer: Self-pay | Admitting: Family Medicine

## 2012-07-09 LAB — PRO B NATRIURETIC PEPTIDE: Pro B Natriuretic peptide (BNP): 1215 pg/mL — ABNORMAL HIGH (ref <126)

## 2012-07-09 NOTE — Assessment & Plan Note (Signed)
Pro-BNP 1215 (1064 2 months ago). Will continue Lasix for now.

## 2012-07-13 ENCOUNTER — Other Ambulatory Visit: Payer: PRIVATE HEALTH INSURANCE

## 2012-07-13 ENCOUNTER — Other Ambulatory Visit: Payer: Self-pay | Admitting: Family Medicine

## 2012-07-13 LAB — HEMOCCULT SLIDES (X 3 CARDS)
OCCULT 1: POSITIVE
OCCULT 4: POSITIVE

## 2012-07-14 ENCOUNTER — Other Ambulatory Visit: Payer: Self-pay | Admitting: *Deleted

## 2012-07-14 DIAGNOSIS — D649 Anemia, unspecified: Secondary | ICD-10-CM

## 2012-07-14 NOTE — Telephone Encounter (Signed)
Please call Rx for Xanax and notify Rx iron sent.  Will you also tell her that we will no longer send in Rx for Xanax over the phone. She needs to make an appointment before she runs out of Xanax next time to be seen since this is controlled substance.  Thank you.

## 2012-07-15 NOTE — Telephone Encounter (Signed)
Rx for Xanax called to [facility] per Dr Verdie Drown and pt notified to pick up ferrous sulfate Rx that was sent to pharmacy. Pt instructed to sched OV before needing a RF on Xanax in future. Pt agreed.

## 2012-07-19 ENCOUNTER — Ambulatory Visit: Payer: PRIVATE HEALTH INSURANCE | Admitting: Home Health Services

## 2012-07-21 ENCOUNTER — Encounter: Payer: Self-pay | Admitting: Family Medicine

## 2012-07-21 ENCOUNTER — Ambulatory Visit (INDEPENDENT_AMBULATORY_CARE_PROVIDER_SITE_OTHER): Payer: PRIVATE HEALTH INSURANCE | Admitting: Family Medicine

## 2012-07-21 VITALS — BP 104/69 | HR 70 | Temp 98.5°F | Ht 66.0 in | Wt 196.0 lb

## 2012-07-21 DIAGNOSIS — I10 Essential (primary) hypertension: Secondary | ICD-10-CM

## 2012-07-21 DIAGNOSIS — N898 Other specified noninflammatory disorders of vagina: Secondary | ICD-10-CM

## 2012-07-21 DIAGNOSIS — N949 Unspecified condition associated with female genital organs and menstrual cycle: Secondary | ICD-10-CM

## 2012-07-21 MED ORDER — POTASSIUM CHLORIDE ER 10 MEQ PO TBCR: 10.0000 meq | EXTENDED_RELEASE_TABLET | Freq: Every day | ORAL | Status: DC

## 2012-07-21 NOTE — Patient Instructions (Signed)
1. Decrease Lasix to 20 mg daily 2. Re-start cholesterol medication. If you stop getting muscle aches again, let me know.  3. Make a lab visit in 1 week to check your potassium.  4. Take 1 tablet of your potassium  5. Follow-up with me in 1 month

## 2012-07-22 NOTE — Assessment & Plan Note (Signed)
Persistent after she voids. She smells something "ranky" on her underwear as well.  Vaginitis ruled-out previously.  -Will monitor for now. She will let me know if she starts having other symptoms.  -Continue estradiol

## 2012-07-22 NOTE — Assessment & Plan Note (Signed)
Blood pressure low today. Denied lightheadedness at this time but has sometimes -Decrease Lasix from 40 to 20 mg  -Decrease KCl from 20 to 10  -Recheck BMET 1 week -Continue Coreg 6.25. HR 70s today.  -Follow-up in 1 month

## 2012-07-22 NOTE — Progress Notes (Signed)
  Subjective:    Patient ID: [name], female    DOB: 05-29-1946, 66 y.o.   MRN: [id number]  HPI # Follow-up: vaginal odor, "ranky" Persistent No smell right now because she just washed before she came in  May be coming from rectum from colonoscopy? Denies abdominal pain, nausea/vomiting, blood in stool, vaginal irritation  # Hypertension, diastolic HF  Compliant with medications most of the time  She notices swelling in her legs worsen when she doesn't take Lasix, usually takes every day  ROS: denies chest pain   Review of Systems Per HPI   Allergies, medication, past medical history reviewed.  Significant for:  Atrial fibrillation on ASA 81 only because of history of GIB History of GIB from AVM Anxiety Cardiac pacemaker for complete heart block     Objective:   Physical Exam GEN: NAD  CV: RRR, no m/r/g; no JVD PULM: NI WOB, CTAB EXT: no edema    Assessment & Plan:

## 2012-08-09 ENCOUNTER — Telehealth: Payer: Self-pay | Admitting: Family Medicine

## 2012-08-09 NOTE — Telephone Encounter (Signed)
Message copied by [facility], Samuel Germany on Mon Aug 09, 2012  9:52 AM ------      Message from: Candelaria Celeste      Created: Mon Aug 09, 2012  9:03 AM      Regarding: home visit patient       Sorry Levada Dy,       I was referring to this patient whose scheduled for a home visit Thursday

## 2012-08-09 NOTE — Telephone Encounter (Signed)
FRONT OFFICE: Will you please cancel patient's appointment for home visit this week since I am no longer on Geriatrics and will not be able to go? Will you just ask that she keep her follow-up appointment with me 09/04? Thank you.

## 2012-08-12 ENCOUNTER — Ambulatory Visit: Payer: PRIVATE HEALTH INSURANCE

## 2012-08-18 ENCOUNTER — Ambulatory Visit (INDEPENDENT_AMBULATORY_CARE_PROVIDER_SITE_OTHER): Payer: PRIVATE HEALTH INSURANCE | Admitting: Family Medicine

## 2012-08-18 VITALS — BP 89/69 | HR 69 | Temp 98.5°F | Ht 66.0 in | Wt 199.0 lb

## 2012-08-18 DIAGNOSIS — Z0000 Encounter for general adult medical examination without abnormal findings: Secondary | ICD-10-CM

## 2012-08-18 DIAGNOSIS — B351 Tinea unguium: Secondary | ICD-10-CM | POA: Insufficient documentation

## 2012-08-18 DIAGNOSIS — I10 Essential (primary) hypertension: Secondary | ICD-10-CM

## 2012-08-18 DIAGNOSIS — Z780 Asymptomatic menopausal state: Secondary | ICD-10-CM

## 2012-08-18 DIAGNOSIS — I442 Atrioventricular block, complete: Secondary | ICD-10-CM

## 2012-08-18 MED ORDER — FUROSEMIDE 20 MG PO TABS: 10.0000 mg | ORAL_TABLET | Freq: Every day | ORAL | Status: DC

## 2012-08-18 MED ORDER — TERBINAFINE HCL 250 MG PO TABS: 250.0000 mg | ORAL_TABLET | Freq: Every day | ORAL | Status: DC

## 2012-08-18 MED ORDER — CALCIUM-VITAMIN D 600-400 MG-UNIT PO TABS: 1.0000 | ORAL_TABLET | Freq: Two times a day (BID) | ORAL | Status: DC

## 2012-08-18 NOTE — Progress Notes (Signed)
  Subjective:    Patient ID: [name], female    DOB: Dec 18, 1945, 66 y.o.   MRN: [id number]  HPI # Hypotension Her blood pressure remains low She is taking Lasix 20 mg daily  ROS: she has occasional lightheadedness  # Toe nail infection She has tried OTC topicals but they do not seem to be working  # Preventative  Review of Systems Per HPI Denies chest pain  Allergies, medication, past medical history reviewed.     Objective:   Physical Exam GEN: NAD; well-nourished, -appearing; overweight CV: RRR, 2/6 systolic murmur PULM: NI WOB; CTAB without rales EXT: no edema NAIL: sever toenails dark, brittle, hypertrophied    Assessment & Plan:

## 2012-08-18 NOTE — Assessment & Plan Note (Signed)
Terbinafine x 12 weeks 

## 2012-08-18 NOTE — Patient Instructions (Addendum)
For your blood pressure: -DECREASE Lasix to 10 mg daily (new prescription was sent) -STOP potassium  -RETURN TO CLINIC NEXT WEEK [facility]) FOR A LAB VISIT  For your toe nail fungal infection: -Take terbinafine daily for 12 weeks  For your bones: -We will schedule a bone scan for you -Start calcium and vitamin D tablets  Follow-up with Dr. Verdie Drown in 1 month for your blood pressure

## 2012-08-18 NOTE — Assessment & Plan Note (Signed)
She has been hypotensives the last few visits. She was still taking 20 mg of Lasix. We will decrease to 10 mg. We will hold potassium. She will come in next week to check K.  She will monitor for signs of fluid overload. She was given indications to call or return to clinic.  Follow-up in 1 month.

## 2012-08-18 NOTE — Assessment & Plan Note (Signed)
-  Will check DEXA -Will start Ca and vitamin D

## 2012-08-19 ENCOUNTER — Telehealth: Payer: Self-pay | Admitting: *Deleted

## 2012-08-19 ENCOUNTER — Encounter: Payer: Self-pay | Admitting: *Deleted

## 2012-08-19 NOTE — Telephone Encounter (Signed)
Patient's phone has been temporary disconnected. Mailed a letter to her to remind her labs are due.

## 2012-08-19 NOTE — Telephone Encounter (Signed)
Message copied by Hulan Saas on Thu Aug 19, 2012 10:35 AM ------      Message from: Hulan Saas      Created: Wed Jul 14, 2012  8:35 AM       Call and remind pt. Due for CBC, iron for DB on 08/23/12 Labs in computer

## 2012-08-23 ENCOUNTER — Other Ambulatory Visit (INDEPENDENT_AMBULATORY_CARE_PROVIDER_SITE_OTHER): Payer: PRIVATE HEALTH INSURANCE

## 2012-08-23 ENCOUNTER — Telehealth: Payer: Self-pay | Admitting: Internal Medicine

## 2012-08-23 DIAGNOSIS — D649 Anemia, unspecified: Secondary | ICD-10-CM

## 2012-08-23 LAB — CBC WITH DIFFERENTIAL/PLATELET
Basophils Relative: 0.7 % (ref 0.0–3.0)
Eosinophils Relative: 3 % (ref 0.0–5.0)
HCT: 29.1 % — ABNORMAL LOW (ref 36.0–46.0)
Hemoglobin: 9.4 g/dL — ABNORMAL LOW (ref 12.0–15.0)
Lymphs Abs: 0.9 10*3/uL (ref 0.7–4.0)
MCV: 89.2 fl (ref 78.0–100.0)
Monocytes Absolute: 0.7 10*3/uL (ref 0.1–1.0)
Monocytes Relative: 16.1 % — ABNORMAL HIGH (ref 3.0–12.0)
Neutro Abs: 2.6 10*3/uL (ref 1.4–7.7)
RBC: 3.27 Mil/uL — ABNORMAL LOW (ref 3.87–5.11)
WBC: 4.4 10*3/uL — ABNORMAL LOW (ref 4.5–10.5)

## 2012-08-23 LAB — IBC PANEL: Iron: 47 ug/dL (ref 42–145)

## 2012-08-23 NOTE — Telephone Encounter (Signed)
Left a message for patient to call me. 

## 2012-08-24 ENCOUNTER — Other Ambulatory Visit: Payer: Self-pay | Admitting: *Deleted

## 2012-08-24 DIAGNOSIS — D649 Anemia, unspecified: Secondary | ICD-10-CM

## 2012-08-24 MED ORDER — HYDROCORTISONE ACETATE 25 MG RE SUPP: RECTAL | Status: DC

## 2012-08-24 NOTE — Telephone Encounter (Signed)
See results note on 08/23/12

## 2012-08-25 ENCOUNTER — Ambulatory Visit
Admission: RE | Admit: 2012-08-25 | Discharge: 2012-08-25 | Disposition: A | Discharge status: Home / Self Care | Payer: PRIVATE HEALTH INSURANCE | Source: Ambulatory Visit | Attending: Family Medicine | Admitting: Family Medicine

## 2012-08-25 ENCOUNTER — Encounter: Payer: Self-pay | Admitting: Family Medicine

## 2012-08-25 DIAGNOSIS — Z780 Asymptomatic menopausal state: Secondary | ICD-10-CM

## 2012-08-27 ENCOUNTER — Encounter (HOSPITAL_COMMUNITY): Payer: Self-pay | Admitting: Emergency Medicine

## 2012-08-27 ENCOUNTER — Other Ambulatory Visit: Payer: Self-pay

## 2012-08-27 ENCOUNTER — Inpatient Hospital Stay (HOSPITAL_COMMUNITY): Payer: PRIVATE HEALTH INSURANCE

## 2012-08-27 ENCOUNTER — Emergency Department (HOSPITAL_COMMUNITY): Payer: PRIVATE HEALTH INSURANCE

## 2012-08-27 ENCOUNTER — Inpatient Hospital Stay (HOSPITAL_COMMUNITY)
Admission: EM | Admit: 2012-08-27 | Discharge: 2012-08-28 | DRG: 554 | Disposition: A | Discharge status: Home / Self Care | Payer: PRIVATE HEALTH INSURANCE | Attending: Family Medicine | Admitting: Family Medicine

## 2012-08-27 DIAGNOSIS — G4730 Sleep apnea, unspecified: Secondary | ICD-10-CM | POA: Diagnosis present

## 2012-08-27 DIAGNOSIS — N184 Chronic kidney disease, stage 4 (severe): Secondary | ICD-10-CM | POA: Diagnosis present

## 2012-08-27 DIAGNOSIS — M129 Arthropathy, unspecified: Secondary | ICD-10-CM | POA: Diagnosis present

## 2012-08-27 DIAGNOSIS — R7989 Other specified abnormal findings of blood chemistry: Secondary | ICD-10-CM

## 2012-08-27 DIAGNOSIS — N179 Acute kidney failure, unspecified: Secondary | ICD-10-CM | POA: Diagnosis present

## 2012-08-27 DIAGNOSIS — K219 Gastro-esophageal reflux disease without esophagitis: Secondary | ICD-10-CM | POA: Diagnosis present

## 2012-08-27 DIAGNOSIS — I517 Cardiomegaly: Secondary | ICD-10-CM | POA: Diagnosis present

## 2012-08-27 DIAGNOSIS — Z950 Presence of cardiac pacemaker: Secondary | ICD-10-CM

## 2012-08-27 DIAGNOSIS — B351 Tinea unguium: Secondary | ICD-10-CM | POA: Diagnosis present

## 2012-08-27 DIAGNOSIS — R609 Edema, unspecified: Secondary | ICD-10-CM

## 2012-08-27 DIAGNOSIS — R0602 Shortness of breath: Secondary | ICD-10-CM

## 2012-08-27 DIAGNOSIS — M109 Gout, unspecified: Secondary | ICD-10-CM

## 2012-08-27 DIAGNOSIS — E669 Obesity, unspecified: Secondary | ICD-10-CM | POA: Diagnosis present

## 2012-08-27 DIAGNOSIS — I4891 Unspecified atrial fibrillation: Secondary | ICD-10-CM | POA: Diagnosis present

## 2012-08-27 DIAGNOSIS — Q2730 Arteriovenous malformation, site unspecified: Secondary | ICD-10-CM

## 2012-08-27 DIAGNOSIS — Z87891 Personal history of nicotine dependence: Secondary | ICD-10-CM

## 2012-08-27 DIAGNOSIS — M79609 Pain in unspecified limb: Secondary | ICD-10-CM

## 2012-08-27 DIAGNOSIS — J45909 Unspecified asthma, uncomplicated: Secondary | ICD-10-CM | POA: Diagnosis present

## 2012-08-27 DIAGNOSIS — I5032 Chronic diastolic (congestive) heart failure: Secondary | ICD-10-CM | POA: Diagnosis present

## 2012-08-27 DIAGNOSIS — IMO0002 Reserved for concepts with insufficient information to code with codable children: Secondary | ICD-10-CM | POA: Diagnosis present

## 2012-08-27 DIAGNOSIS — M7989 Other specified soft tissue disorders: Secondary | ICD-10-CM

## 2012-08-27 DIAGNOSIS — I10 Essential (primary) hypertension: Secondary | ICD-10-CM | POA: Diagnosis present

## 2012-08-27 DIAGNOSIS — Z79899 Other long term (current) drug therapy: Secondary | ICD-10-CM

## 2012-08-27 DIAGNOSIS — N183 Chronic kidney disease, stage 3 unspecified: Secondary | ICD-10-CM | POA: Diagnosis present

## 2012-08-27 DIAGNOSIS — R55 Syncope and collapse: Secondary | ICD-10-CM | POA: Diagnosis present

## 2012-08-27 DIAGNOSIS — E785 Hyperlipidemia, unspecified: Secondary | ICD-10-CM | POA: Diagnosis present

## 2012-08-27 DIAGNOSIS — Z7982 Long term (current) use of aspirin: Secondary | ICD-10-CM

## 2012-08-27 DIAGNOSIS — I5023 Acute on chronic systolic (congestive) heart failure: Secondary | ICD-10-CM

## 2012-08-27 DIAGNOSIS — I129 Hypertensive chronic kidney disease with stage 1 through stage 4 chronic kidney disease, or unspecified chronic kidney disease: Secondary | ICD-10-CM | POA: Diagnosis present

## 2012-08-27 DIAGNOSIS — D649 Anemia, unspecified: Secondary | ICD-10-CM | POA: Diagnosis present

## 2012-08-27 DIAGNOSIS — I442 Atrioventricular block, complete: Secondary | ICD-10-CM | POA: Diagnosis present

## 2012-08-27 DIAGNOSIS — I509 Heart failure, unspecified: Secondary | ICD-10-CM | POA: Diagnosis present

## 2012-08-27 LAB — CBC
HCT: 28.7 % — ABNORMAL LOW (ref 36.0–46.0)
Hemoglobin: 9.2 g/dL — ABNORMAL LOW (ref 12.0–15.0)
MCV: 88.9 fL (ref 78.0–100.0)
RBC: 3.23 MIL/uL — ABNORMAL LOW (ref 3.87–5.11)
WBC: 7 10*3/uL (ref 4.0–10.5)

## 2012-08-27 LAB — POCT I-STAT TROPONIN I

## 2012-08-27 LAB — CBC WITH DIFFERENTIAL/PLATELET
HCT: 28.1 % — ABNORMAL LOW (ref 36.0–46.0)
Hemoglobin: 9 g/dL — ABNORMAL LOW (ref 12.0–15.0)
Lymphocytes Relative: 20 % (ref 12–46)
Lymphs Abs: 1.3 10*3/uL (ref 0.7–4.0)
MCHC: 32 g/dL (ref 30.0–36.0)
Monocytes Absolute: 0.7 10*3/uL (ref 0.1–1.0)
Monocytes Relative: 11 % (ref 3–12)
Neutro Abs: 4.3 10*3/uL (ref 1.7–7.7)
Neutrophils Relative %: 67 % (ref 43–77)
RBC: 3.16 MIL/uL — ABNORMAL LOW (ref 3.87–5.11)
WBC: 6.4 10*3/uL (ref 4.0–10.5)

## 2012-08-27 LAB — CK TOTAL AND CKMB (NOT AT ARMC)
CK, MB: 2.9 ng/mL (ref 0.3–4.0)
Relative Index: INVALID (ref 0.0–2.5)
Total CK: 86 U/L (ref 7–177)
Total CK: 81 U/L (ref 7–177)

## 2012-08-27 LAB — POCT I-STAT, CHEM 8
BUN: 45 mg/dL — ABNORMAL HIGH (ref 6–23)
Chloride: 105 mEq/L (ref 96–112)
Creatinine, Ser: 1.9 mg/dL — ABNORMAL HIGH (ref 0.50–1.10)
Sodium: 139 mEq/L (ref 135–145)

## 2012-08-27 LAB — BASIC METABOLIC PANEL
CO2: 25 mEq/L (ref 19–32)
Chloride: 103 mEq/L (ref 96–112)
Glucose, Bld: 111 mg/dL — ABNORMAL HIGH (ref 70–99)
Sodium: 139 mEq/L (ref 135–145)

## 2012-08-27 LAB — TROPONIN I
Troponin I: <0.3 ng/mL (ref <0.30)
Troponin I: <0.3 ng/mL (ref <0.30)

## 2012-08-27 LAB — URIC ACID: Uric Acid, Serum: 10.4 mg/dL — ABNORMAL HIGH (ref 2.4–7.0)

## 2012-08-27 MED ORDER — CARVEDILOL 6.25 MG PO TABS: 6.2500 mg | ORAL_TABLET | ORAL | Status: DC

## 2012-08-27 MED ORDER — ACETAMINOPHEN 650 MG RE SUPP: 650.0000 mg | Freq: Four times a day (QID) | RECTAL | Status: DC | PRN

## 2012-08-27 MED ORDER — FUROSEMIDE 10 MG/ML IJ SOLN
20.0000 mg | Freq: Once | INTRAMUSCULAR | Status: AC
  Administered 2012-08-27: 20 mg via INTRAVENOUS
  Filled 2012-08-27: qty 2

## 2012-08-27 MED ORDER — CARVEDILOL 12.5 MG PO TABS
12.5000 mg | ORAL_TABLET | Freq: Every day | ORAL | Status: DC
  Administered 2012-08-27 – 2012-08-28 (×2): 12.5 mg via ORAL
  Filled 2012-08-27 (×3): qty 1

## 2012-08-27 MED ORDER — ASPIRIN 81 MG PO CHEW
81.0000 mg | CHEWABLE_TABLET | Freq: Every day | ORAL | Status: DC
  Administered 2012-08-27 – 2012-08-28 (×2): 81 mg via ORAL
  Filled 2012-08-27: qty 1

## 2012-08-27 MED ORDER — CALCIUM CARBONATE-VITAMIN D 500-200 MG-UNIT PO TABS
1.0000 | ORAL_TABLET | Freq: Two times a day (BID) | ORAL | Status: DC
  Administered 2012-08-27 – 2012-08-28 (×3): 1 via ORAL
  Filled 2012-08-27 (×5): qty 1

## 2012-08-27 MED ORDER — SODIUM CHLORIDE 0.9 % IJ SOLN
3.0000 mL | Freq: Two times a day (BID) | INTRAMUSCULAR | Status: DC
  Administered 2012-08-27 – 2012-08-28 (×3): 3 mL via INTRAVENOUS

## 2012-08-27 MED ORDER — PANTOPRAZOLE SODIUM 40 MG PO TBEC
40.0000 mg | DELAYED_RELEASE_TABLET | Freq: Every day | ORAL | Status: DC
  Administered 2012-08-27: 40 mg via ORAL
  Filled 2012-08-27: qty 1

## 2012-08-27 MED ORDER — HEPARIN SODIUM (PORCINE) 5000 UNIT/ML IJ SOLN
5000.0000 [IU] | Freq: Three times a day (TID) | INTRAMUSCULAR | Status: DC
  Administered 2012-08-27 – 2012-08-28 (×4): 5000 [IU] via SUBCUTANEOUS
  Filled 2012-08-27 (×7): qty 1

## 2012-08-27 MED ORDER — ALBUTEROL SULFATE HFA 108 (90 BASE) MCG/ACT IN AERS: 2.0000 | INHALATION_SPRAY | RESPIRATORY_TRACT | Status: DC | PRN

## 2012-08-27 MED ORDER — NITROGLYCERIN 0.4 MG SL SUBL: 0.4000 mg | SUBLINGUAL_TABLET | SUBLINGUAL | Status: DC | PRN

## 2012-08-27 MED ORDER — COLCHICINE 0.6 MG PO TABS
0.6000 mg | ORAL_TABLET | Freq: Once | ORAL | Status: AC
  Administered 2012-08-27: 0.6 mg via ORAL
  Filled 2012-08-27: qty 1

## 2012-08-27 MED ORDER — SUCRALFATE 1 G PO TABS
1.0000 g | ORAL_TABLET | Freq: Two times a day (BID) | ORAL | Status: DC
  Administered 2012-08-27 – 2012-08-28 (×3): 1 g via ORAL
  Filled 2012-08-27 (×4): qty 1

## 2012-08-27 MED ORDER — CARVEDILOL 6.25 MG PO TABS
6.2500 mg | ORAL_TABLET | Freq: Every day | ORAL | Status: DC
  Administered 2012-08-27: 6.25 mg via ORAL
  Filled 2012-08-27 (×2): qty 1

## 2012-08-27 MED ORDER — ACETAMINOPHEN 325 MG PO TABS
650.0000 mg | ORAL_TABLET | Freq: Four times a day (QID) | ORAL | Status: DC | PRN
  Administered 2012-08-27: 650 mg via ORAL
  Filled 2012-08-27: qty 2

## 2012-08-27 MED ORDER — PREDNISONE 20 MG PO TABS
40.0000 mg | ORAL_TABLET | Freq: Every day | ORAL | Status: DC
  Administered 2012-08-27 – 2012-08-28 (×2): 40 mg via ORAL
  Filled 2012-08-27 (×3): qty 2

## 2012-08-27 MED ORDER — ATORVASTATIN CALCIUM 40 MG PO TABS
40.0000 mg | ORAL_TABLET | Freq: Every day | ORAL | Status: DC
  Administered 2012-08-27: 40 mg via ORAL
  Filled 2012-08-27 (×2): qty 1

## 2012-08-27 MED ORDER — ALPRAZOLAM 0.25 MG PO TABS: 0.5000 mg | ORAL_TABLET | Freq: Every evening | ORAL | Status: DC | PRN

## 2012-08-27 MED ORDER — COLCHICINE 0.6 MG PO TABS
1.2000 mg | ORAL_TABLET | Freq: Once | ORAL | Status: AC
  Administered 2012-08-27: 1.2 mg via ORAL
  Filled 2012-08-27: qty 2

## 2012-08-27 MED ORDER — MORPHINE SULFATE 2 MG/ML IJ SOLN: 1.0000 mg | INTRAMUSCULAR | Status: DC | PRN

## 2012-08-27 NOTE — ED Notes (Addendum)
RETURNED FROM X-RAY , DENIES PAIN OR DISCOMFORT , RESPIRATIONS UNLABORED , IV SITE UMREMARKABLE.

## 2012-08-27 NOTE — ED Provider Notes (Signed)
History     CSN: [id number]  Arrival date & time 08/27/12  0002   First MD Initiated Contact with Patient 08/27/12 0023      Chief Complaint  Patient presents with  . Leg Swelling    (Consider location/radiation/quality/duration/timing/severity/associated sxs/prior treatment) Patient is a 66 y.o. female presenting with shortness of breath. The history is provided by the patient. No language interpreter was used.  Shortness of Breath  The current episode started more than 2 weeks ago. The onset was gradual. The problem occurs continuously. The problem has been unchanged. The problem is moderate. Nothing relieves the symptoms. The symptoms are aggravated by activity. Associated symptoms include shortness of breath. Pertinent negatives include no chest pain, no chest pressure, no fever, no rhinorrhea, no sore throat and no cough. There was no intake of a foreign body. She has not inhaled smoke recently. She has had prior hospitalizations. Her past medical history does not include asthma. She has been behaving normally.  Weight up 12 pounds tonight and having worsening peripheral edema. Came in for evaluation did not call her doctor.    Past Medical History  Diagnosis Date  . Angioedema     2/2 ACE  . RLS (restless legs syndrome)     Dx 06/2007  . Anemia   . Arteriovenous malformation of stomach   . Permanent atrial fibrillation   . Renal failure     baseline creatinine 1.6  . Hyperlipidemia   . Depression   . Diastolic heart failure   . Arthritis   . Sleep apnea   . Asthma   . Hx of colonic polyp 08/13/10    adenomatous  . Fatty liver 07/26/10  . Panic attacks   . Hypertension   . Complete heart block     s/p PPM 1998  . Hypertrophic cardiomyopathy     dx by Dr [name] 2009  . Hx of colonoscopy   . GI bleed   . Panic attack   . DDD (degenerative disc disease)   . History of alcohol abuse Stopped Fall 2012  . History of tobacco use Quit Fall 2012  . Shortness of breath       sob on exertation  . CHF (congestive heart failure)   . Angina   . GERD (gastroesophageal reflux disease)   . AVM (arteriovenous malformation) of colon     small intestine; stomach  . Right arm pain 01/08/2012  . Hyperlipidemia   . Blood transfusion     Past Surgical History  Procedure Date  . Tubal ligation 04/01/2000  . Pacemaker insertion [place], most recent gen change by [name] 4/12  . Cardiac catheterization   . Esophagogastroduodenoscopy 12/23/2011    Procedure: ESOPHAGOGASTRODUODENOSCOPY (EGD);  Surgeon: [name], MD;  Location: [facility];  Service: Endoscopy;  Laterality: N/A;  . Givens capsule study 12/23/2011    Procedure: GIVENS CAPSULE STUDY;  Surgeon: [name], MD;  Location: [facility];  Service: Endoscopy;  Laterality: N/A;    Family History  Problem Relation Age of Onset  . Hypertension Mother   . Stroke Mother   . Hypertension Father   . Stroke Father   . Hypertension Sister   . Hypertension Brother   . Colon cancer Brother   . Diabetes Sister   . Diabetes Brother   . Cancer Brother   . Heart attack Brother   . Heart attack Father   . Heart disease Mother   . Heart disease  Sister   . Alcohol abuse Brother   . Alcohol abuse Father     History  Substance Use Topics  . Smoking status: Former Smoker -- 0.1 packs/day for 50 years    Types: Cigarettes    Quit date: 08/16/2011  . Smokeless tobacco: Never Used   Comment: quit x 3 months  . Alcohol Use: No     quit 8 mos ago    OB History    Grav Para Term Preterm Abortions TAB SAB Ect Mult Living                  Review of Systems  Constitutional: Negative for fever.  HENT: Negative for sore throat and rhinorrhea.   Respiratory: Positive for shortness of breath. Negative for cough.   Cardiovascular: Positive for leg swelling. Negative for chest pain.  All other systems reviewed and are negative.    Allergies  Ace inhibitors  Home Medications   Current Outpatient Rx   Name Route Sig Dispense Refill  . ALBUTEROL SULFATE HFA 108 (90 BASE) MCG/ACT IN AERS Inhalation Inhale 2 puffs into the lungs every 4 (four) hours as needed. For shortness of breath     . ALPRAZOLAM 0.5 MG PO TABS  TAKE 1 TABLET BY MOUTH EVERY NIGHT AT BEDTIME AS NEEDED FOR ANXIETY 15 tablet 0  . ASPIRIN 81 MG PO TABS Oral Take 81 mg by mouth daily.    [name] CALCIUM-VITAMIN D 600-400 MG-UNIT PO TABS Oral Take 1 tablet by mouth 2 (two) times daily. 60 tablet 12  . CARVEDILOL 6.25 MG PO TABS Oral Take 1 tablet (6.25 mg total) by mouth as directed. Take 2 tablets AM and 1 tablet PM. 75 tablet 6  . COQ10 100 MG PO CAPS Oral Take 1 capsule by mouth as needed.    [name] ESTRADIOL 0.1 MG/GM VA CREA  2 grams daily for 2 weeks, then 2 grams twice weekly for 2 weeks, then 2 grams weekly. 42.5 g 12  . FERROUS SULFATE 325 (65 FE) MG PO TABS  TAKE 1 TABLET BY MOUTH TWICE DAILY WITH MEALS (TAKE TO KEEP YOUR BLOOD COUNTS UP) 60 tablet 1  . FUROSEMIDE 20 MG PO TABS Oral Take 0.5 tablets (10 mg total) by mouth daily. 30 tablet 0  . HYDROCORTISONE ACETATE 25 MG RE SUPP  Insert one nightly 11 suppository 0  . ADULT MULTIVITAMIN W/MINERALS CH Oral Take 1 tablet by mouth daily.    [name] NITROGLYCERIN 0.4 MG SL SUBL Sublingual Place 0.4 mg under the tongue every 5 (five) minutes as needed. For chest pain    . OLOPATADINE HCL 0.2 % OP SOLN Ophthalmic Apply 1 drop to eye daily as needed. For allergies    . OMEPRAZOLE 20 MG PO CPDR Oral Take 1 capsule (20 mg total) by mouth 2 (two) times daily. 40 capsule 1  . ROSUVASTATIN CALCIUM 20 MG PO TABS Oral Take 1 tablet (20 mg total) by mouth daily. 30 tablet 12  . SUCRALFATE 1 G PO TABS Oral Take 1 tablet (1 g total) by mouth 2 (two) times daily. 60 tablet 1  . TERBINAFINE HCL 250 MG PO TABS Oral Take 1 tablet (250 mg total) by mouth daily. For toenail infection 30 tablet 4    BP 145/82  Pulse 71  Temp 99.2 F (37.3 C) (Oral)  Resp 22  SpO2 96%  Physical Exam  Constitutional:  She is oriented to person, place, and time. She appears well-developed and well-nourished. No distress.  HENT:  Head: Normocephalic and atraumatic.  Mouth/Throat: Oropharynx is clear and moist.  Eyes: Conjunctivae normal are normal. Pupils are equal, round, and reactive to light.  Neck: Normal range of motion. Neck supple.  Cardiovascular: Normal rate and regular rhythm.   Pulmonary/Chest: She has decreased breath sounds.  Abdominal: Soft. Bowel sounds are normal. There is no tenderness. There is no rebound and no guarding.  Musculoskeletal: Normal range of motion. She exhibits edema.  Neurological: She is alert and oriented to person, place, and time.  Skin: Skin is warm and dry.  Psychiatric: She has a normal mood and affect.    ED Course  Procedures (including critical care time)   Labs Reviewed  CBC WITH DIFFERENTIAL  PRO B NATRIURETIC PEPTIDE   Dg Bone Density  08/25/2012  *RADIOLOGY REPORT*  Clinical Data: 66 year old postmenopausal female  DUAL X-RAY ABSORPTIOMETRY (DXA) FOR BONE MINERAL DENSITY  AP LUMBAR SPINE (L1-L4)  Bone Mineral Density (BMD):            1.052 g/cm2 Young Adult T Score:                          0.0 Z Score:                                                1.3  LEFT FEMUR NECK  Bone Mineral Density (BMD):             0.765 g/cm2 Young Adult T Score:                           -0.8 Z Score:                                                 -0.1  ASSESSMENT:  Patient's diagnostic category is NORMAL by WHO Criteria.  FRACTURE RISK: NOT INCREASED  FRAX: World Health Organization FRAX assessment of absolute fracture risk is not calculated for this patient because the patient has a normal study.  Comparison: The bone mineral density lumbar spine and left hip have not significantly changed when compared to the baseline study dated 05/07/2004.  RECOMMENDATIONS:  Effective therapies are available in the form of bisphosphonates, selective estrogen receptor modulators, biologic  agents, and hormone replacement therapy (for women).  All patients should ensure an adequate intake of dietary calcium (1200mg  daily) and vitamin D (800 IU daily) unless contraindicated.  All treatment decisions require clinical judgement and consideration of individual patient factors, including patient preferences, co-morbidities, previous drug use, risk factors not captured in the FRAX model (e.g., frailty, falls, vitamin D deficiency, increased bone turnover, interval significant decline in bone density) and possible under-or over-estimation of fracture risk by FRAX.  The National Osteoporosis Foundation recommends that FDA-approved medical therapies be considered in postmenopausal women and mean age 102 or older with a:        1)     Hip or vertebral (clinical or morphometric) fracture.           2)    T-score of -2.5 or lower at the spine or hip. 3)    Ten-year fracture probability by FRAX of 3%  or greater for hip fracture or 20% or greater for major osteoporotic fracture. FOLLOW-UP:  People with diagnosed cases of osteoporosis or at high risk for fracture should have regular bone mineral density tests.  For patients eligible for Medicare, routine testing is allowed once every 2 years.  The testing frequency can be increased to one year for patients who have rapidly progressing disease, those who are receiving or discontinuing medical therapy to restore bone mass, or have additional risk factors.  World [employer]) Criteria:  Normal: T scores from +1.0 to -1.0 Low Bone Mass (Osteopenia): T scores between -1.0 and -2.5 Osteoporosis: T scores -2.5 and below  Comparison to Reference Population:  T score is the key measure used in the diagnosis of osteoporosis and relative risk determination for fracture.  It provides a value for bone mass relative to the mean bone mass of a young adult reference population expressed in terms of standard deviation (SD).  Z score is the age-matched score showing the  patient's values compared to a population matched for age, sex, and race.  This is also expressed in terms of standard deviation.  The patient may have values that compare favorably to the age-matched values and still be at increased risk for fracture.   Original Report Authenticated By: Jeanie Cooks. Miquel Dunn, M.D.      No diagnosis found.    MDM   Date: 08/27/2012  Rate: 73  Rhythm: ventricular paced rhythm  QRS Axis: left  Intervals: QT prolonged  ST/T Wave abnormalities: normal  Conduction Disutrbances:none  Narrative Interpretation:   Old EKG Reviewed: none available       Will need admission.  Order for VQ scan placed.    Carlisle Beers, MD 08/27/12 [phone]

## 2012-08-27 NOTE — Progress Notes (Addendum)
Addendum to fellow's admit note  Pt is well known to me.  She presents with complaint of SOB and palpitations.  She has permanent afib and complete heart block s/p PPM.  She also has severe LVH with mid cavitary obliteration of the LV and CRI also (baseline creatinine 1.6). She actually does not appear far from her baseline at this point, though her weight has increased. I do not anticipate that she is having an ACS event.  Cycle markers today. No need to repeat echo.  I will have her pacemaker interrogated to exclude arrhythmias. IV diuresis today. Hopefully if improved, she can be discharged in 24 hour or so.  Jeneen Rinks Maanya Hippert,MD

## 2012-08-27 NOTE — H&P (Signed)
I interviewed and examined this patient and discussed the care plan with Dr. Sherril Cong and the [facility] team and agree with assessment and plan as documented in the admission note for today. I believe her left foot pain is from gout and would treat with low dose Colchicine and with Prednisone. NSAIDS are contraindicated by her CHF and GI problems. The joints affected are not amenable to aspiration to do crystal analysis to confirm the diagnosis.    Gratz A. Walker Kehr, MD Family Medicine Teaching Service Attending  08/27/2012 10:44 AM

## 2012-08-27 NOTE — ED Notes (Signed)
Report given to floor nurse , transported to floor in  stable condition , denies pain at time of transport , respirations unlabored , iv site intact.

## 2012-08-27 NOTE — Progress Notes (Signed)
Utilization Review Completed.  

## 2012-08-27 NOTE — Progress Notes (Signed)
VASCULAR LAB PRELIMINARY  PRELIMINARY  PRELIMINARY  PRELIMINARY  Bilateral lower extremity venous duplex completed.    Preliminary report:  Bilateral:  No evidence of DVT, superficial thrombosis, or Baker's Cyst.   Kimmberly Wisser, RVS 08/27/2012, 12:52 PM

## 2012-08-27 NOTE — Consult Note (Signed)
Reason for Consult: Shortness of Breath, Elevated Troponin and Elevated BNP Referring Physician: Dr. Bonnee Quin is an 66 y.o. female.  HPI: 66 yo woman with presumed hypertrophic cardiomyopathy, diastolic heart failure, anemia, complete heart block s/p PPM, chronic atrial fibrillation no on warfarin 2/2 prior GI bleed with gastric AVM, hypertension last admitted with shortness of breath in May 2013. Cardiology consulted given troponin I of 0.23 in setting of negative d-dimer with elevated BNP of 2200s. She tells me her weight fluctuates significantly but over the last 1-2 weeks she has continued to rise - she weighs herself daily. She has slightly more SOB/DOE but she is already limited significantly. She will not quantify how limited she is in distance but says she cannot walk around the grocery store and does get SOB in her home. She is compliant with her medications but she does know the name of her medications but does tell me she knows which pills to take one and/or twice a day. She will eat out at [facility] of red lobster but typically boils chicken for dinner +/- can of spinach for example. She has stable 2 pillow orthopnea, no overt PND and some left foot pain. Recent subtle illness but no significant fever/chills/night sweats recently.   Past Medical History  Diagnosis Date  . Angioedema     2/2 ACE  . RLS (restless legs syndrome)     Dx 06/2007  . Anemia   . Arteriovenous malformation of stomach   . Permanent atrial fibrillation   . Renal failure     baseline creatinine 1.6  . Hyperlipidemia   . Depression   . Diastolic heart failure   . Arthritis   . Sleep apnea   . Asthma   . Hx of colonic polyp 08/13/10    adenomatous  . Fatty liver 07/26/10  . Panic attacks   . Hypertension   . Complete heart block     s/p PPM 1998  . Hypertrophic cardiomyopathy     dx by Dr [name] 2009  . Hx of colonoscopy   . GI bleed   . Panic attack   . DDD (degenerative disc disease)    . History of alcohol abuse Stopped Fall 2012  . History of tobacco use Quit Fall 2012  . Shortness of breath     sob on exertation  . CHF (congestive heart failure)   . Angina   . GERD (gastroesophageal reflux disease)   . AVM (arteriovenous malformation) of colon     small intestine; stomach  . Right arm pain 01/08/2012  . Hyperlipidemia   . Blood transfusion     Past Surgical History  Procedure Date  . Tubal ligation 04/01/2000  . Pacemaker insertion [place], most recent gen change by [name] 4/12  . Cardiac catheterization   . Esophagogastroduodenoscopy 12/23/2011    Procedure: ESOPHAGOGASTRODUODENOSCOPY (EGD);  Surgeon: [name], MD;  Location: [facility];  Service: Endoscopy;  Laterality: N/A;  . Givens capsule study 12/23/2011    Procedure: GIVENS CAPSULE STUDY;  Surgeon: [name], MD;  Location: [facility];  Service: Endoscopy;  Laterality: N/A;    Family History  Problem Relation Age of Onset  . Hypertension Mother   . Stroke Mother   . Hypertension Father   . Stroke Father   . Hypertension Sister   . Hypertension Brother   . Colon cancer Brother   . Diabetes Sister   . Diabetes Brother   .  Cancer Brother   . Heart attack Brother   . Heart attack Father   . Heart disease Mother   . Heart disease Sister   . Alcohol abuse Brother   . Alcohol abuse Father     Social History:  reports that she quit smoking about a year ago. Her smoking use included Cigarettes. She has a 5 pack-year smoking history. She has never used smokeless tobacco. She reports that she does not drink alcohol or use illicit drugs.  Allergies:  Allergies  Allergen Reactions  . Ace Inhibitors     REACTION: angioedema    Medications:  I have reviewed the patient's current medications. Prior to Admission:  Prescriptions prior to admission  Medication Sig Dispense Refill  . albuterol (PROVENTIL HFA;VENTOLIN HFA) 108 (90 BASE) MCG/ACT inhaler Inhale 2 puffs into the lungs  every 4 (four) hours as needed. For shortness of breath       . ALPRAZolam (XANAX) 0.5 MG tablet TAKE 1 TABLET BY MOUTH EVERY NIGHT AT BEDTIME AS NEEDED FOR ANXIETY  15 tablet  0  . aspirin 81 MG tablet Take 81 mg by mouth daily.      . Calcium Carb-Cholecalciferol (CALCIUM-VITAMIN D) 600-400 MG-UNIT TABS Take 1 tablet by mouth 2 (two) times daily.  60 tablet  12  . carvedilol (COREG) 6.25 MG tablet Take 1 tablet (6.25 mg total) by mouth as directed. Take 2 tablets AM and 1 tablet PM.  75 tablet  6  . Coenzyme Q10 (COQ10) 100 MG CAPS Take 1 capsule by mouth as needed.      [name] estradiol (ESTRACE) 0.1 MG/GM vaginal cream 2 grams daily for 2 weeks, then 2 grams twice weekly for 2 weeks, then 2 grams weekly.  42.5 g  12  . ferrous sulfate 325 (65 FE) MG tablet TAKE 1 TABLET BY MOUTH TWICE DAILY WITH MEALS (TAKE TO KEEP YOUR BLOOD COUNTS UP)  60 tablet  1  . furosemide (LASIX) 20 MG tablet Take 0.5 tablets (10 mg total) by mouth daily.  30 tablet  0  . hydrocortisone (ANUSOL-HC) 25 MG suppository Insert one nightly  11 suppository  0  . Multiple Vitamin (MULITIVITAMIN WITH MINERALS) TABS Take 1 tablet by mouth daily.      . nitroGLYCERIN (NITROSTAT) 0.4 MG SL tablet Place 0.4 mg under the tongue every 5 (five) minutes as needed. For chest pain      . Olopatadine HCl 0.2 % SOLN Apply 1 drop to eye daily as needed. For allergies      . omeprazole (PRILOSEC) 20 MG capsule Take 1 capsule (20 mg total) by mouth 2 (two) times daily.  40 capsule  1  . rosuvastatin (CRESTOR) 20 MG tablet Take 1 tablet (20 mg total) by mouth daily.  30 tablet  12  . sucralfate (CARAFATE) 1 G tablet Take 1 tablet (1 g total) by mouth 2 (two) times daily.  60 tablet  1  . terbinafine (LAMISIL) 250 MG tablet Take 1 tablet (250 mg total) by mouth daily. For toenail infection  30 tablet  4    Results for orders placed during the hospital encounter of 08/27/12 (from the past 48 hour(s))  CBC WITH DIFFERENTIAL     Status: Abnormal    Collection Time   08/27/12 12:46 AM      Component Value Range Comment   WBC 6.4  4.0 - 10.5 K/uL    RBC 3.16 (*) 3.87 - 5.11 MIL/uL    Hemoglobin 9.0 (*)  12.0 - 15.0 g/dL    HCT 28.1 (*) 36.0 - 46.0 %    MCV 88.9  78.0 - 100.0 fL    MCH 28.5  26.0 - 34.0 pg    MCHC 32.0  30.0 - 36.0 g/dL    RDW 13.1  11.5 - 15.5 %    Platelets 250  150 - 400 K/uL    Neutrophils Relative 67  43 - 77 %    Neutro Abs 4.3  1.7 - 7.7 K/uL    Lymphocytes Relative 20  12 - 46 %    Lymphs Abs 1.3  0.7 - 4.0 K/uL    Monocytes Relative 11  3 - 12 %    Monocytes Absolute 0.7  0.1 - 1.0 K/uL    Eosinophils Relative 2  0 - 5 %    Eosinophils Absolute 0.1  0.0 - 0.7 K/uL    Basophils Relative 1  0 - 1 %    Basophils Absolute 0.0  0.0 - 0.1 K/uL   PRO B NATRIURETIC PEPTIDE     Status: Abnormal   Collection Time   08/27/12 12:46 AM      Component Value Range Comment   Pro B Natriuretic peptide (BNP) 2214.0 (*) 0 - 125 pg/mL   D-DIMER, QUANTITATIVE     Status: Normal   Collection Time   08/27/12 12:46 AM      Component Value Range Comment   D-Dimer, Quant 0.34  0.00 - 0.48 ug/mL-FEU   POCT I-STAT TROPONIN I     Status: Abnormal   Collection Time   08/27/12  1:07 AM      Component Value Range Comment   Troponin i, poc 0.23 (*) 0.00 - 0.08 ng/mL    Comment NOTIFIED PHYSICIAN      Comment 3            POCT I-STAT, CHEM 8     Status: Abnormal   Collection Time   08/27/12  1:29 AM      Component Value Range Comment   Sodium 139  135 - 145 mEq/L    Potassium 4.7  3.5 - 5.1 mEq/L    Chloride 105  96 - 112 mEq/L    BUN 45 (*) 6 - 23 mg/dL    Creatinine, Ser 1.90 (*) 0.50 - 1.10 mg/dL    Glucose, Bld 117 (*) 70 - 99 mg/dL    Calcium, Ion 1.20  1.13 - 1.30 mmol/L    TCO2 25  0 - 100 mmol/L    Hemoglobin 11.2 (*) 12.0 - 15.0 g/dL    HCT 33.0 (*) 36.0 - 46.0 %   TROPONIN I     Status: Normal   Collection Time   08/27/12  2:56 AM      Component Value Range Comment   Troponin I <0.30  <0.30 ng/mL      Review  of Systems  Constitutional: Positive for malaise/fatigue. Negative for fever, chills and weight loss.       12 lb weight gain per patient  HENT: Negative for hearing loss and neck pain.   Eyes: Negative for double vision and photophobia.  Respiratory: Positive for shortness of breath. Negative for cough and hemoptysis.   Cardiovascular: Positive for palpitations, orthopnea and leg swelling. Negative for chest pain and PND.  Gastrointestinal: Negative for heartburn, nausea, vomiting and abdominal pain.  Genitourinary: Negative for dysuria, urgency and frequency.  Musculoskeletal: Negative for myalgias.  Skin: Negative for itching and rash.  Neurological:  Negative for dizziness, tingling and headaches.  Endo/Heme/Allergies: Negative for environmental allergies and polydipsia. Bruises/bleeds easily.  Psychiatric/Behavioral: Negative for depression and suicidal ideas.   Blood pressure 111/68, pulse 79, temperature 99.2 F (37.3 C), temperature source Oral, resp. rate 18, SpO2 98.00%. Physical Exam  Nursing note and vitals reviewed. Constitutional: She is oriented to person, place, and time. She appears well-developed and well-nourished. No distress.  HENT:  Head: Normocephalic and atraumatic.  Nose: Nose normal.  Mouth/Throat: Oropharynx is clear and moist. No oropharyngeal exudate.  Eyes: Conjunctivae normal and EOM are normal. Pupils are equal, round, and reactive to light. No scleral icterus.  Neck: Normal range of motion. Neck supple. JVD present.       3 cm above clavicle at 45 degrees with +HJR  Cardiovascular: Normal rate, regular rhythm, normal heart sounds and intact distal pulses.  Exam reveals no friction rub.   No murmur heard. Respiratory: Effort normal and breath sounds normal. No respiratory distress. She has no wheezes. She has no rales.  GI: Soft. Bowel sounds are normal. She exhibits no distension. There is no tenderness.  Musculoskeletal: Normal range of motion. She  exhibits no tenderness.       Trace LEE bilaterally  Neurological: She is alert and oriented to person, place, and time. No cranial nerve deficit. Coordination normal.  Skin: Skin is warm and dry. No rash noted. She is not diaphoretic. No erythema.  Psychiatric: She has a normal mood and affect. Her behavior is normal.   EKG reviewed: V-paced rhythm 5/13 TTE reviewed: severe LVH, mid cavity LV obliteration EF 60-65%, RVSP ~ 32 mmHg  Problem List Shortness of breath Elevated BNP Elevated troponin I Hypertrophic Cardiomyopathy Acute on chronic renal failure Chronic atrial fibrillation Anemia Complete Heart Block with PPM  Assessment/Plan: 66 yo woman with hypertrophic cardiomyopathy (presumed), diastolic heart failure, chronic atrial fibrillation admitted with weight gain, shortness of breath and elevated BNP. Her troponin has been elevated multiple times in the past, she's had a negative LHC '09 and her syndrome is not consistent with ACS. However, will plan on trending biomarkers with  CKMB/troponins and start diuresis with IV lasix bid with goal net negative ~ 2 liters in the first 24 hours and assess JVP/symptoms afterwards. Given HOCM like physiology, agree with continued coreg, perhaps 6.25 mg bid (home dose 12.5/6.25 mg) to increase diastolic filling time and rate control atrial fibrillation.  - trend troponin, CKMB - agree with diuresis with IV lasix - good response per patient with 20 mg x1, perhaps 20 mg q12 and titrate up as necessary; replete K/Mg for goal > 4/ and > 2 - please obtain official admissions weight; strict ins/outs, daily weight - defer Echo given 5/13 echo unless challenging to [place], [place] 08/27/2012, 3:37 AM

## 2012-08-27 NOTE — H&P (Signed)
[facility] Admission History and Physical Service Pager: [phone]  Patient name: [name] Medical record number: [id number] Date of birth: 08/17/1946 Age: 66 y.o. Gender: female  Primary Care Provider: [facility] PARK, ANGELA, MD  Chief Complaint: Foot Pain, Shortness of Breath  History of Present Illness: [name] is a 66 y.o. year old female with diastolic CHF and hypertrophic cardiomyopathy  presenting with a 1 day history left foot pain and shortness of breath. Her L foot began to hurt and swell 1 day ago, the pain does not radiate. It hurts worse when she stands on it or tries to walk and was not alleviated by a heating pad or icy hot. She denies trauma to the affected foot. She also denies erythema, chills, sweats, nausea, and vomiting. She does note that she had a subjective fever once in the last day.    For the last day she has also had shortness of breath and a 12 lb weight gain. She denies chest pain but has had some palpitations. She states that she can only walk a few steps without getting short of breath. She reports 2 pillow orthopnea, and denies PND. She's had a new cough for the last 2 months. And denies recent increase in sputum production.   She reports that she quit smoking and drinking EtOH 1 year ago. She was formerly a 1 ppd smoker.   Patient Active Problem List  Diagnosis  . HYPERLIPIDEMIA  . ANXIETY  . HYPERTENSION, BENIGN SYSTEMIC  . Atrial fibrillation  . Chronic diastolic heart failure  . CHRONIC KIDNEY DISEASE STAGE III (MODERATE)  . ARTHRITIS  . APNEA, SLEEP  . PACEMAKER  . Anemia  . AVM (arteriovenous malformation)  . Complete heart block  . Elbow mass  . Vaginal odor  . Onychomycosis  . Preventative health care   Past Medical History: Past Medical History  Diagnosis Date  . Angioedema     2/2 ACE  . RLS (restless legs syndrome)     Dx 06/2007  . Anemia   . Arteriovenous malformation of stomach     . Permanent atrial fibrillation   . Renal failure     baseline creatinine 1.6  . Hyperlipidemia   . Depression   . Diastolic heart failure   . Arthritis   . Sleep apnea   . Asthma   . Hx of colonic polyp 08/13/10    adenomatous  . Fatty liver 07/26/10  . Panic attacks   . Hypertension   . Complete heart block     s/p PPM 1998  . Hypertrophic cardiomyopathy     dx by Dr [name] 2009  . Hx of colonoscopy   . GI bleed   . Panic attack   . DDD (degenerative disc disease)   . History of alcohol abuse Stopped Fall 2012  . History of tobacco use Quit Fall 2012  . Shortness of breath     sob on exertation  . CHF (congestive heart failure)   . Angina   . GERD (gastroesophageal reflux disease)   . AVM (arteriovenous malformation) of colon     small intestine; stomach  . Right arm pain 01/08/2012  . Hyperlipidemia   . Blood transfusion    Past Surgical History: Past Surgical History  Procedure Date  . Tubal ligation 04/01/2000  . Pacemaker insertion [place], most recent gen change by [name] 4/12  . Cardiac catheterization   . Esophagogastroduodenoscopy 12/23/2011    Procedure:  ESOPHAGOGASTRODUODENOSCOPY (EGD);  Surgeon: [name], MD;  Location: [facility];  Service: Endoscopy;  Laterality: N/A;  . Givens capsule study 12/23/2011    Procedure: GIVENS CAPSULE STUDY;  Surgeon: [name], MD;  Location: [facility];  Service: Endoscopy;  Laterality: N/A;   Social History: History  Substance Use Topics  . Smoking status: Former Smoker -- 0.1 packs/day for 50 years    Types: Cigarettes    Quit date: 08/16/2011  . Smokeless tobacco: Never Used   Comment: quit x 3 months  . Alcohol Use: No     quit 8 mos ago   For any additional social history documentation, please refer to relevant sections of EMR.  Family History: Family History  Problem Relation Age of Onset  . Hypertension Mother   . Stroke Mother   . Hypertension Father   . Stroke Father   . Hypertension  Sister   . Hypertension Brother   . Colon cancer Brother   . Diabetes Sister   . Diabetes Brother   . Cancer Brother   . Heart attack Brother   . Heart attack Father   . Heart disease Mother   . Heart disease Sister   . Alcohol abuse Brother   . Alcohol abuse Father    Allergies: Allergies  Allergen Reactions  . Ace Inhibitors     REACTION: angioedema   Current Facility-Administered Medications on File Prior to Encounter  Medication Dose Route Frequency Provider Last Rate Last Dose  . cyanocobalamin ((VITAMIN B-12)) injection 1,000 mcg  1,000 mcg Intramuscular Q30 days [name], MD   1,000 mcg at 09/16/11 1341  . cyanocobalamin ((VITAMIN B-12)) injection 1,000 mcg  1,000 mcg Intramuscular Q30 days [name], MD   1,000 mcg at 04/05/12 1402   Current Outpatient Prescriptions on File Prior to Encounter  Medication Sig Dispense Refill  . albuterol (PROVENTIL HFA;VENTOLIN HFA) 108 (90 BASE) MCG/ACT inhaler Inhale 2 puffs into the lungs every 4 (four) hours as needed. For shortness of breath       . ALPRAZolam (XANAX) 0.5 MG tablet TAKE 1 TABLET BY MOUTH EVERY NIGHT AT BEDTIME AS NEEDED FOR ANXIETY  15 tablet  0  . aspirin 81 MG tablet Take 81 mg by mouth daily.      . Calcium Carb-Cholecalciferol (CALCIUM-VITAMIN D) 600-400 MG-UNIT TABS Take 1 tablet by mouth 2 (two) times daily.  60 tablet  12  . carvedilol (COREG) 6.25 MG tablet Take 1 tablet (6.25 mg total) by mouth as directed. Take 2 tablets AM and 1 tablet PM.  75 tablet  6  . Coenzyme Q10 (COQ10) 100 MG CAPS Take 1 capsule by mouth as needed.      [name] estradiol (ESTRACE) 0.1 MG/GM vaginal cream 2 grams daily for 2 weeks, then 2 grams twice weekly for 2 weeks, then 2 grams weekly.  42.5 g  12  . ferrous sulfate 325 (65 FE) MG tablet TAKE 1 TABLET BY MOUTH TWICE DAILY WITH MEALS (TAKE TO KEEP YOUR BLOOD COUNTS UP)  60 tablet  1  . furosemide (LASIX) 20 MG tablet Take 0.5 tablets (10 mg total) by mouth daily.  30 tablet  0    . hydrocortisone (ANUSOL-HC) 25 MG suppository Insert one nightly  11 suppository  0  . Multiple Vitamin (MULITIVITAMIN WITH MINERALS) TABS Take 1 tablet by mouth daily.      . nitroGLYCERIN (NITROSTAT) 0.4 MG SL tablet Place 0.4 mg under the tongue every 5 (five)  minutes as needed. For chest pain      . Olopatadine HCl 0.2 % SOLN Apply 1 drop to eye daily as needed. For allergies      . omeprazole (PRILOSEC) 20 MG capsule Take 1 capsule (20 mg total) by mouth 2 (two) times daily.  40 capsule  1  . rosuvastatin (CRESTOR) 20 MG tablet Take 1 tablet (20 mg total) by mouth daily.  30 tablet  12  . sucralfate (CARAFATE) 1 G tablet Take 1 tablet (1 g total) by mouth 2 (two) times daily.  60 tablet  1  . terbinafine (LAMISIL) 250 MG tablet Take 1 tablet (250 mg total) by mouth daily. For toenail infection  30 tablet  4   Review Of Systems: Per HPI  Otherwise 12 point review of systems was performed and was unremarkable.  Physical Exam: BP 140/90  Pulse 70  Temp 99.2 F (37.3 C) (Oral)  Resp 22  SpO2 95% Exam: Gen: NAD, comfortable lying in bed, alert, cooperative with exam HEENT: NCAT, EOMI, PERRL, Scleral icterus, NO LAD CV: RRR, no murmur, good s1/s2 Resp: CTABL, no wheezes or crackles, mildly labored breathing Abd: SNTND, BS present, no guarding or organomegaly Ext: L ankle swollen compared to R, L foot tender to palpation along the dorsum of the fooot and medial ankle, Limted ROM compared to R, pain with active and passive ROM. Trace pitting edema BL, No calf tenderness Neuro: Alert and oriented, no gross deficits Labs and Imaging: CBC BMET   Lab 08/27/12 0436  WBC 7.0  HGB 9.2*  HCT 28.7*  PLT 234    Lab 08/27/12 0129  NA 139  K 4.7  CL 105  CO2 --  BUN 45*  CREATININE 1.90*  GLUCOSE 117*  CALCIUM --     Pro BNP 2214 iTroponin 0.23 Troponin <0.30  DG Foot complete 08/27/2012 IMPRESSION: No acute bony abnormalities.  CXR 08/27/2012 IMPRESSION:  1. No acute  abnormality. 2. Mildly improved cardiomegaly. 3. Stable pulmonary vascular congestion and mild chronic interstitial lung disease.   Assessment and Plan: [name] is a 66 y.o. year old female presenting with a 1 day history of left foot pain and swelling, shortness of breath, and a 12 lb weight gain.  1. Shortness of breath- Disatolic heart failure exacerbation vs. PE vs. ACS. Most likely CHF exacerbation with BNP 2200 and recent 12 lb weight gain.  1. iTroponin  0.23, repeat troponin <0.30, cycle 1 more in 6 hours 2. D- Dimer- 0.34 3. [facility] cardiology consulted- request trend troponins and CK and agree with diuresis 4. IV Lasix  5. Daily weights 6. Albuterol PRN 7. Consider ECHO 2. Left foot pain- DVT vs. Cellulitis vs. Traumatic Injury vs. Arthritis 1. Foot xray and WBC WNL 2. Will plan for doppler US in the AM 3. Complete Heart block s/p pace maker placement 1. Paced rhythm 70-79 bpm 2. Monitor and discuss with cards as appropriate.  4. Chronic Afib 1. Not anticoagulation candidate b/c of hx of rectal bleeding 2/2 AVM 5. HTN- Controlled currently 1. Continue home meds 6. CKD3- Cre 1.90, baseline Cre around 1.5 1. Watch Cre closely as we diurese her.  2. Monitor I/O 7. HLD 1. Continue statin 8. Anemia: Hgb currently 9.2. She has  1. known Fe defficiency anemia 2. Continue iron sulfate 9. FEN/GI: Heart healthy diet 10. Prophylaxis: Sq Heparin 11. Disposition: Home pending improvement in foot pain and SOB 12. Code Status: Full for now, will plan to discuss with the  patient in the AM  Kenn File, MD 08/27/2012, 5:19 AM  Agree with Dr. Alen Bleacher note and plan. Briefly, 66 yo female who presents with 1 day history of left leg pain. Pain started yesterday without any enticing event. Pain is worst with putting weight on it. Se denies any redness. Pain is located on the top of the foot and the medial aspect.She tried icy hots and heat pads which helped mildly.   She also reports some increased shortness of breath and 12 lb weight gain in the last couple of days. She reports chronic dyspnea at baseline and is unable to climb a flight of stairs without being tired and she feels that this may have gotten worst recently. She also notices that her scale has gone from 188lb to 202lbs recently. She denies any chest pain, only occasional heart flutter. She reports 2 pillow orthopnea and denies paroxysmal nocturnal dyspnea.   General:  Filed Vitals:   08/27/12 0008 08/27/12 0308 08/27/12 0330 08/27/12 0400  BP: 145/82 111/68 120/93 140/90  Pulse: 71 79 70 70  Temp: 99.2 F (37.3 C)     TempSrc: Oral     Resp: 22 18 24 22   SpO2: 96% 98% 96% 95%   General: pleasant, obese lady who appears older than stated age, in no acute distress HEENT: moist mucous membranes, PERLA, EOMI CV: s1s2, rrr, no murmur Pulm: cta b/l, no crackles appreciated GI: present bowel sounds, non tender, obese but non distended.  Extr: trace pitting pedal edema Left leg: increased swelling around left ankle compared to right, no erythema or significant increased warmth, strong distal pulses bilaterally, tenderness to palpation along dorsal aspect of foot. Plantarflexion in left foot limited compared to right but otherwise, normal range of motion of left ankle.   A/P: 66 yo female with h/o diastolic heart failure (echo 07/2012: EF: 60-65% with severe LVH), heart block s/p pacemaker, CKD2 and HTN who presented with left leg edema and dyspnea.  1. Leg edema concerning for DVT, but given normal D-dimer, presence of DVT is very unlikely. Leg doesn't appear cellulitic given absence of erythema. Xray was normal not showing any acute fractures. Doesn't appear to be gout induced, but will obtain uric acid to confirm. Will also obtain lower extremity doppler. 2. Dyspnea: not requiring oxygen at this time. She doesn't have crackles or significant edema on exam, however given elevated BNP and symptoms  of fluid overload, will give lasix 20mg  IV. Will check morning BMP to trend creatinine.  3. Elevation in troponins: on istat, which then normalized on repeat troponin. Likely from CHF or CKD3. Cardiology was consulted who recommends cycling enzymes and continuing with lasix.  4. CKD3: elevation in creatinine compared to baseline which runs between 1 and 1.6. Will continue to monitor.   Liam Graham, PGY-2 [facility]

## 2012-08-27 NOTE — ED Notes (Signed)
Pt says she is having bilateral feet swelling; called MD and told to come here; reports she normally weight 188 but is now over 200 tonight. Pt is wheezing and SOB but without obvious distress. Pacemaker.

## 2012-08-28 DIAGNOSIS — R0602 Shortness of breath: Secondary | ICD-10-CM

## 2012-08-28 LAB — BASIC METABOLIC PANEL
BUN: 32 mg/dL — ABNORMAL HIGH (ref 6–23)
CO2: 26 mEq/L (ref 19–32)
Calcium: 9.9 mg/dL (ref 8.4–10.5)
GFR calc non Af Amer: 40 mL/min — ABNORMAL LOW (ref >90)
Glucose, Bld: 91 mg/dL (ref 70–99)
Sodium: 138 mEq/L (ref 135–145)

## 2012-08-28 MED ORDER — FUROSEMIDE 20 MG PO TABS
10.0000 mg | ORAL_TABLET | Freq: Every day | ORAL | Status: DC
  Administered 2012-08-28: 10 mg via ORAL
  Filled 2012-08-28: qty 0.5

## 2012-08-28 MED ORDER — SENNA 8.6 MG PO TABS
2.0000 | ORAL_TABLET | ORAL | Status: DC
  Filled 2012-08-28: qty 2

## 2012-08-28 MED ORDER — COLCHICINE 0.6 MG PO TABS: 0.6000 mg | ORAL_TABLET | Freq: Every day | ORAL | Status: DC

## 2012-08-28 MED ORDER — PREDNISONE 20 MG PO TABS: 40.0000 mg | ORAL_TABLET | Freq: Every day | ORAL | Status: AC

## 2012-08-28 NOTE — Discharge Summary (Signed)
Seen and examined.  Agree with DC, management and documentation by Dr Maricela Bo

## 2012-08-28 NOTE — Discharge Summary (Signed)
Physician Discharge Summary  Patient ID: [name] MRN: [id number] DOB/AGE: 09-09-1946 66 y.o.  Admit date: 08/27/2012 Discharge date: 08/28/2012  PCP:  [facility] PARK, Levada Dy, MD Cardiologist: Thompson Grayer, MD   Admission Diagnoses: Shortness of breath Left ankle pain and swelling   Discharge Diagnoses:  Principal Problem:  *Gout attack Active Problems:  HYPERLIPIDEMIA  HYPERTENSION, BENIGN SYSTEMIC  Atrial fibrillation  Chronic diastolic heart failure  CHRONIC KIDNEY DISEASE STAGE III (MODERATE)  PACEMAKER  Anemia  AVM (arteriovenous malformation)  Complete heart block  Acute on chronic kidney failure   Discharged Condition: good  Hospital Course:  [name] severe diastolic HF and CBB s/p PPM who presented with left ankle and foot swelling attributed to gout and increasing SOB concerning for CHF exacerbation.   # SOB - Concerning that this is 2/2 CHF exacerbation. Trop negative x 1. Pro-BNP > 2200  This has been improving since she arrived. She was evaluated by Dr. Rayann Heman and Dr. Claiborne Billings, who recommended diuresis (s/p 20 mg of Lasix IV x 1) with a goal of net negative 2L in 24 hour. Unfortunately, she did not meet that amount, but was symptomatically improved and no further diuresis was necessary. Cardiology signed on on hospital day 2. No changes were made to her home regimen.  # Left ankle pain -  DVT ruled out. This was presumed secondary to gout. She is improving markedly after prednisone and colchicine 0.5 mg. Coontinue 5 day course of prednisone 40 mg to end 9/16. Also given colchicine 0.6 mg daily for 5 days, since we want close follow up on renal function  # Acute Kidney Injury - Likely pre-renal as is improved during hospitalization with creatine 1.9 --> 1.34.    Consults: cardiology - [place]   Significant Diagnostic Studies:  Uric Acid 10.4  Cardiac Enzymes:  Lab 08/27/12 1527 08/27/12 0838 08/27/12 0256  CKTOTAL 81 86 --  CKMB 2.7 2.9 --    CKMBINDEX -- -- --  TROPONINI <0.30 <0.30 <0.30   Pro- BNP > 2200  Creatinine 1.9 --> 1.5  GFR: 33  Dg Chest 2 View  08/27/2012  *RADIOLOGY REPORT*  Clinical Data: Shortness of breath and leg swelling.  History of hypertension and asthma.  CHEST - 2 VIEW  Comparison: 05/03/2012.  Findings: Enlarged cardiac silhouette with a mild decrease in size. Stable prominence of the pulmonary vasculature and interstitial markings.  Stable right subclavian pacemaker leads.  Unremarkable bones.  IMPRESSION:  1.  No acute abnormality. 2.  Mildly improved cardiomegaly. 3.  Stable pulmonary vascular congestion and mild chronic interstitial lung disease.   Original Report Authenticated By: Gerald Stabs, M.D.    Dg Foot Complete Left  08/27/2012  *RADIOLOGY REPORT*  Clinical Data: There is left foot pain.  No known injury.  LEFT FOOT - COMPLETE 3+ VIEW  Comparison: None.  Findings: The left foot appears intact. No evidence of acute fracture or subluxation.  No focal bone lesions.  Bone matrix and cortex appear intact.  No abnormal radiopaque densities in the soft tissues.  Small plantar calcaneal spur.  IMPRESSION: No acute bony abnormalities.   Original Report Authenticated By: Neale Burly, M.D.     Discharge Exam: Blood pressure 123/74, pulse 71, temperature 97.9 F (36.6 C), temperature source Oral, resp. rate 18, height 5\' 5"  (1.651 m), weight 198 lb 3.1 oz (89.9 kg), SpO2 98.00%. Gen: NAD, comfortable lying in bed supine, alert, cooperative with exam  HEENT: NCAT, EOMI, PERRL, Scleral icterus, NO  LAD  CV: RRR, no murmur, good s1/s2  Resp: CTAB, no rales, normal work of breathing, patient ambulated and developed no SOB  Abd: mild distention, non-tender, BS present, no guarding or organomegaly  Ext: L ankle with mild swelling around lateral malleolus, but no warmth, erythema, or tenderness; normal ROM; no pitting edema of LE bilaterally  Neuro: Alert and oriented, no gross  deficits   Disposition: 01-Home or Self Care      Discharge Orders    Future Appointments: Provider: Department: Dept Phone: Center:   09/03/2012 9:00 AM [facility] [phone] None   09/22/2012 2:45 PM Carolin Guernsey, MD [facility] [phone] [facility]   10/21/2012 2:00 PM Thompson Grayer, [place] [phone] [facility]   11/22/2012 2:00 PM [facility] [place] [phone] [facility]     Future Orders Please Complete By Expires   Discharge patient          Medication List     As of 08/28/2012 12:50 PM    TAKE these medications         albuterol 108 (90 BASE) MCG/ACT inhaler   Commonly known as: PROVENTIL HFA;VENTOLIN HFA   Inhale 2 puffs into the lungs every 4 (four) hours as needed. For shortness of breath      ALPRAZolam 0.5 MG tablet   Commonly known as: XANAX   TAKE 1 TABLET BY MOUTH EVERY NIGHT AT BEDTIME AS NEEDED FOR ANXIETY      aspirin 81 MG tablet   Take 81 mg by mouth daily.      Calcium-Vitamin D 600-400 MG-UNIT Tabs   Take 1 tablet by mouth 2 (two) times daily.      carvedilol 6.25 MG tablet   Commonly known as: COREG   Take 1 tablet (6.25 mg total) by mouth as directed. Take 2 tablets AM and 1 tablet PM.      colchicine 0.6 MG tablet   Take 1 tablet (0.6 mg total) by mouth daily.      CoQ10 100 MG Caps   Take 1 capsule by mouth as needed.      estradiol 0.1 MG/GM vaginal cream   Commonly known as: ESTRACE   2 grams daily for 2 weeks, then 2 grams twice weekly for 2 weeks, then 2 grams weekly.      ferrous sulfate 325 (65 FE) MG tablet   TAKE 1 TABLET BY MOUTH TWICE DAILY WITH MEALS (TAKE TO KEEP YOUR BLOOD COUNTS UP)      furosemide 20 MG tablet   Commonly known as: LASIX   Take 0.5 tablets (10 mg total) by mouth daily.      hydrocortisone 25 MG suppository   Commonly known as: ANUSOL-HC   Insert one nightly      multivitamin with minerals Tabs   Take 1 tablet by  mouth daily.      nitroGLYCERIN 0.4 MG SL tablet   Commonly known as: NITROSTAT   Place 0.4 mg under the tongue every 5 (five) minutes as needed. For chest pain      Olopatadine HCl 0.2 % Soln   Apply 1 drop to eye daily as needed. For allergies      omeprazole 20 MG capsule   Commonly known as: PRILOSEC   Take 1 capsule (20 mg total) by mouth 2 (two) times daily.      predniSONE 20 MG tablet   Commonly known as: DELTASONE   Take 2 tablets (40  mg total) by mouth daily with breakfast.      rosuvastatin 20 MG tablet   Commonly known as: CRESTOR   Take 1 tablet (20 mg total) by mouth daily.      sucralfate 1 G tablet   Commonly known as: CARAFATE   Take 1 tablet (1 g total) by mouth 2 (two) times daily.      terbinafine 250 MG tablet   Commonly known as: LAMISIL   Take 1 tablet (250 mg total) by mouth daily. For toenail infection         Follow Up Issues:  1. Gout  Flare - Check to make sure improving and consider allopurinol/remove offending agents since Uric Acid 10.4 2. CKD - Recheck renal function to ensure that it's not worsened by colchicine 3. CHF - Make sure patient not have increasing dyspnea   Follow-up Information    Follow up with [facility] PARK, ANGELA, MD. Schedule an appointment as soon as possible for a visit in 5 days.   Contact information:   [place] [address] [phone]          Signed: Dewain Penning 08/28/2012, 12:50 PM

## 2012-08-28 NOTE — Progress Notes (Signed)
Daily Progress Note  Family Medicine Resident Pager [phone]  Patient name: [name] Medical record number: [id number] Date of birth: April 11, 1946 Age: 66 y.o. Gender: female  Overview: [name] with diastolic heart failure and complete heart block s/p PPM who presented with SOB treated with IV lasix and swelling of the left ankle. The swelling is presumed to gout, and she is being treated with prednisone and  Colchicine.   Subjective: Patient lying in bed this morning and states that the pain and swelling in her left ankle are markedly better. She can walk without pain in the ankle. She also notes that her SOB is the same as it is at home and not bothered by lying flat in bed. Lastly, she notes minimal GI pain and no BM since Wednesday. She requests something to help her bowels move.    Objective: Vital signs in last 24 hours: Temp:  [97.9 F (36.6 C)-98.6 F (37 C)] 97.9 F (36.6 C) (09/14 0711) Pulse Rate:  [68-71] 71  (09/14 0711) Resp:  [16-18] 18  (09/14 0711) BP: (114-129)/(63-74) 123/74 mmHg (09/14 0711) SpO2:  [98 %-99 %] 98 % (09/14 0711) Weight:  [198 lb 3.1 oz (89.9 kg)] 198 lb 3.1 oz (89.9 kg) (09/14 0711) Weight change:  Last BM Date: 08/26/12  Intake/Output from previous day: 09/13 0701 - 09/14 0700 In: 600 [P.O.:600] Out: 925 [Urine:925] Intake/Output this shift:    Physical Exam:  Gen: NAD, comfortable lying in bed supine, alert, cooperative with exam  HEENT: NCAT, EOMI, PERRL, Scleral icterus, NO LAD  CV: RRR, no murmur, good s1/s2  Resp: CTAB, no rales, normal work of breathing, patient ambulated and developed no SOB Abd: mild distention, non-tender, BS present, no guarding or organomegaly  Ext: L ankle with mild swelling around lateral malleolus, but no warmth, erythema, or tenderness; normal ROM; no pitting edema of LE bilaterally   Neuro: Alert and oriented, no gross deficits   Lab Results:  Basename 08/27/12 0436 08/27/12 0129  08/27/12 0046  WBC 7.0 -- 6.4  HGB 9.2* 11.2* --  HCT 28.7* 33.0* --  PLT 234 -- 250   BMET  Basename 08/27/12 0436 08/27/12 0129  NA 139 139  K 4.4 4.7  CL 103 105  CO2 25 --  GLUCOSE 111* 117*  BUN 34* 45*  CREATININE 1.58* 1.90*  CALCIUM 9.9 --   Cardiac Enzymes:  Lab 08/27/12 1527 08/27/12 0838 08/27/12 0256  CKTOTAL 81 86 --  CKMB 2.7 2.9 --  CKMBINDEX -- -- --  TROPONINI <0.30 <0.30 <0.30     Studies/Results: Dg Chest 2 View  08/27/2012  *RADIOLOGY REPORT*  Clinical Data: Shortness of breath and leg swelling.  History of hypertension and asthma.  CHEST - 2 VIEW  Comparison: 05/03/2012.  Findings: Enlarged cardiac silhouette with a mild decrease in size. Stable prominence of the pulmonary vasculature and interstitial markings.  Stable right subclavian pacemaker leads.  Unremarkable bones.  IMPRESSION:  1.  No acute abnormality. 2.  Mildly improved cardiomegaly. 3.  Stable pulmonary vascular congestion and mild chronic interstitial lung disease.   Original Report Authenticated By: Gerald Stabs, M.D.    Dg Foot Complete Left  08/27/2012  *RADIOLOGY REPORT*  Clinical Data: There is left foot pain.  No known injury.  LEFT FOOT - COMPLETE 3+ VIEW  Comparison: None.  Findings: The left foot appears intact. No evidence of acute fracture or subluxation.  No focal bone lesions.  Bone matrix and cortex appear intact.  No abnormal radiopaque densities in the soft tissues.  Small plantar calcaneal spur.  IMPRESSION: No acute bony abnormalities.   Original Report Authenticated By: Neale Burly, M.D.    Lower Extremity Doppler - Negative for DVT bilaterally  Medications: I have reviewed the patient's current medications.  Assessment/Plan: [name] severe diastolic HF and CBB s/p PPM who presented with left ankle and foot swelling attributed to gout and increasing SOB concerning for HF exacerbation.   # SOB - Concerning that this is 2/2 CHF exacerbation. Trop negative x  1. Pro-BNP > 2200 - This has been improving since she arrived. She was evaluated by Dr. Rayann Heman and Dr. Claiborne Billings, who recommended diuresis (s/p 20 mg of Lasix IV x 1) with a goal of net negative 2L in 24 hour. Unfortunately, she has not met that mark as she is only down 900 mL, but she is well appearing and has not objective signs of volume overload today. - Continue home dose of Lasix 10mg  daily - I will defer to cariology's management recommendations for discharge  # Left ankle pain - This is presumed secondary to gout. She is improving markedly after prednisone and colchicine - Continue colchicine 0.6 mg BID as long as GFR > 30, decrease if less  # CKD - improving 1.9 - 1.5; follow up AM Cr and GFR and make sure can tolerate colchicine   # HLD - chome statin  # HTN - come meds Co-reg and furosemide  # GI - continue carafate, protonix and give Sennakot for constipation  # PPX - heparin, protonix  # Dispo - D/c today   LOS: 1 day   Dewain Penning 08/28/2012, 10:27 AM

## 2012-08-28 NOTE — Progress Notes (Signed)
Patient ID: [name], female   DOB: 08/17/1946, 66 y.o.   MRN: [id number] Patient seen. Feeling better. Chart reviewed. She can be from a cardiac standpoint. No reason to repeat any other studies including echocardiography. Please see Dr. Rayann Heman to note from yesterday.

## 2012-08-28 NOTE — Progress Notes (Signed)
Seen and examined.  Much improved and approaching her baseline.  Discussed with Dr. Maricela Bo.  Likely DC this afternoon.

## 2012-08-30 ENCOUNTER — Telehealth: Payer: Self-pay | Admitting: Family Medicine

## 2012-08-30 NOTE — Telephone Encounter (Signed)
Patient needs a refill called in for Prednisone and Colcrys to last until her hfu with you on Monday.  She uses [facility] on [address].  She needs this by Wednesday.

## 2012-08-30 NOTE — Telephone Encounter (Signed)
Patient only supposed to take finite amount of medications. She was given the correct amount. Clarified with patient.

## 2012-09-01 ENCOUNTER — Telehealth: Payer: Self-pay | Admitting: Internal Medicine

## 2012-09-01 ENCOUNTER — Telehealth: Payer: Self-pay | Admitting: *Deleted

## 2012-09-01 ENCOUNTER — Other Ambulatory Visit (HOSPITAL_COMMUNITY): Payer: Self-pay | Admitting: *Deleted

## 2012-09-01 NOTE — Telephone Encounter (Signed)
lmom for her stage III

## 2012-09-01 NOTE — Telephone Encounter (Signed)
[place] Nurse  [phone] ext [phone]   plz return call to verify what stage of chronic kidney disease pt is in.

## 2012-09-01 NOTE — Telephone Encounter (Signed)
Adrianne from [facility] called and wants to speak with patient's PCP.  She needs to know the patient's stage of renal failure to determine if she is a candidate for their HF program.  Her number is [phone] ext [phone].

## 2012-09-02 ENCOUNTER — Telehealth: Payer: Self-pay | Admitting: Internal Medicine

## 2012-09-02 NOTE — Telephone Encounter (Signed)
Spoke with patient and gave her date and time again for IV iron.

## 2012-09-02 NOTE — Telephone Encounter (Signed)
Returned call. Notified [name] she has stage III CKD.

## 2012-09-03 ENCOUNTER — Telehealth: Payer: Self-pay | Admitting: *Deleted

## 2012-09-03 ENCOUNTER — Encounter (HOSPITAL_COMMUNITY): Payer: Self-pay

## 2012-09-03 ENCOUNTER — Encounter (HOSPITAL_COMMUNITY)
Admission: RE | Admit: 2012-09-03 | Discharge: 2012-09-03 | Disposition: A | Discharge status: Home / Self Care | Payer: PRIVATE HEALTH INSURANCE | Source: Ambulatory Visit | Attending: Internal Medicine | Admitting: Internal Medicine

## 2012-09-03 DIAGNOSIS — D649 Anemia, unspecified: Secondary | ICD-10-CM | POA: Insufficient documentation

## 2012-09-03 MED ORDER — SODIUM CHLORIDE 0.9 % IV SOLN
INTRAVENOUS | Status: DC
  Administered 2012-09-03: 13:00:00 via INTRAVENOUS

## 2012-09-03 MED ORDER — ALBUTEROL SULFATE (5 MG/ML) 0.5% IN NEBU
2.5000 mg | INHALATION_SOLUTION | Freq: Once | RESPIRATORY_TRACT | Status: AC
  Administered 2012-09-03: 2.5 mg via RESPIRATORY_TRACT
  Filled 2012-09-03: qty 0.5

## 2012-09-03 MED ORDER — DIPHENHYDRAMINE HCL 50 MG/ML IJ SOLN
25.0000 mg | INTRAMUSCULAR | Status: DC | PRN
  Filled 2012-09-03: qty 1

## 2012-09-03 MED ORDER — METHYLPREDNISOLONE SODIUM SUCC 40 MG IJ SOLR
40.0000 mg | Freq: Once | INTRAMUSCULAR | Status: AC
  Administered 2012-09-03: 40 mg via INTRAVENOUS
  Filled 2012-09-03: qty 1

## 2012-09-03 MED ORDER — SODIUM CHLORIDE 0.9 % IV SOLN
Freq: Once | INTRAVENOUS | Status: AC
  Administered 2012-09-03: 09:00:00 via INTRAVENOUS

## 2012-09-03 MED ORDER — METHYLPREDNISOLONE SODIUM SUCC 125 MG IJ SOLR
40.0000 mg | Freq: Once | INTRAMUSCULAR | Status: DC
  Filled 2012-09-03: qty 0.64

## 2012-09-03 MED ORDER — SODIUM CHLORIDE 0.9 % IV SOLN
25.0000 mg | Freq: Once | INTRAVENOUS | Status: AC
  Administered 2012-09-03: 25 mg via INTRAVENOUS
  Filled 2012-09-03: qty 0.5

## 2012-09-03 NOTE — Progress Notes (Signed)
Test dose completed. Pt tolerated well while infusing. Then became dizzy and light head, and nauseated . Began vomiting. VSS . Audible wheezing noted. 02 2 l via [place] applied.

## 2012-09-03 NOTE — Progress Notes (Addendum)
MEDICATION RELATED CONSULT NOTE - INITIAL   Pharmacy Consult for Iron Dextran Indication: Iron Deficiency Anemia  Allergies  Allergen Reactions  . Ace Inhibitors     REACTION: angioedema    Patient Measurements:    Vital Signs: Temp: 97.7 F (36.5 C) (09/20 0802) Temp src: Oral (09/20 0802) BP: 124/88 mmHg (09/20 0802) Pulse Rate: 69  (09/20 0802) Intake/Output from previous day:   Intake/Output from this shift:    Labs: No results found for this basename: WBC:3,HGB:3,HCT:3,PLT:3,APTT:3;INR:3,CREATININE:3,LABCREA:3,CREATININE:3,CREAT24HRUR:3,MG:3,PHOS:3,ALBUMIN:3,PROT:3,ALBUMIN:3,AST:3,ALT:3,ALKPHOS:3,BILITOT:3,BILIDIR:3,IBILI:3 in the last 72 hours The CrCl is unknown because both a height and weight (above a minimum accepted value) are required for this calculation.   Microbiology: No results found for this or any previous visit (from the past 720 hour(s)).  Medical History: Past Medical History  Diagnosis Date  . Angioedema     2/2 ACE  . RLS (restless legs syndrome)     Dx 06/2007  . Anemia   . Arteriovenous malformation of stomach   . Permanent atrial fibrillation   . Renal failure     baseline creatinine 1.6  . Hyperlipidemia   . Depression   . Diastolic heart failure   . Arthritis   . Sleep apnea   . Asthma   . Hx of colonic polyp 08/13/10    adenomatous  . Fatty liver 07/26/10  . Panic attacks   . Hypertension   . Complete heart block     s/p PPM 1998  . Hypertrophic cardiomyopathy     dx by Dr [name] 2009  . Hx of colonoscopy   . GI bleed   . Panic attack   . DDD (degenerative disc disease)   . History of alcohol abuse Stopped Fall 2012  . History of tobacco use Quit Fall 2012  . Shortness of breath     sob on exertation  . CHF (congestive heart failure)   . Angina   . GERD (gastroesophageal reflux disease)   . AVM (arteriovenous malformation) of colon     small intestine; stomach  . Right arm pain 01/08/2012  . Hyperlipidemia   . Blood  transfusion    Assessment:  48 yof with h/o CKD (stage III), mild iron deficiency anemia (last iron panel with Iron 47, sat ratios 13.8, and transferrin 243.6)  Patient coming through short stat today for Infed administration.  MD ordered for dose to be given over 4 hours with goal Hgb of 12.  Patient wts 90 kg, ht = 5'6''.  Last Hgb available is 9.2 on 9/13.  Awaiting RN to reply if pt will have CBC done this AM.    Goal of Therapy:  Hgb 12  Plan:   Iron dextran 25 mg test dose IV x 1 over 5 minutes.  If no reaction with test dose after 1 hour, give Iron Dextran 1000 mg infuse over 4 hours.  If new CBC obtained today, will re-calculate Iron requirements.   Vanessa Crozier Thi 09/03/2012,8:04 AM   Addendum:  Patient did not tolerate test dose of iron dextran.  Dr. [name] aware.  Received telephone call from RN to cancel dose of Infed.  Not sure of MD plan, can use Venofer or Faraheme if needed. Pharmacy will sign off for now.  Thanks Vanessa Annawan, PharmD, BCPS Pager: [phone] 11:01 AM Pharmacy #: [phone]

## 2012-09-03 NOTE — Progress Notes (Signed)
Spoke w Rollene Fare in office about Patient's  RX to Iron . She will relay to Dr Cristal Generous

## 2012-09-03 NOTE — Progress Notes (Signed)
Dozing w/o distress. States feels better now when awakened for assesment.

## 2012-09-03 NOTE — Progress Notes (Addendum)
NS increased to 100 cc /h. Benadryl with held at moment. BP 71/50

## 2012-09-03 NOTE — Progress Notes (Signed)
Up to Bathroom. Ambulated well in hall. Denies dizziness. Ready to go home. Steady gait

## 2012-09-03 NOTE — Progress Notes (Signed)
Patient up to bathroom w assistance. Voided and had BM.No SOB noted. Encouraged pt to return to a stretcher instead of a chair and placed in partial trendelenburg

## 2012-09-03 NOTE — Progress Notes (Signed)
NS continues at 75cc/hr

## 2012-09-03 NOTE — Telephone Encounter (Signed)
Received a call from [facility] short stay that 5 minutes after patient received test dose of Infed, she was dizzy, feeling faint, SOB, wheezing and vomited x 3 small amount. O2 sat 100%. BP is now 90/46. Patient did take Coreg this AM prior to coming to short stay. Per Dr. [name], albuterol neb prn, Solumedrol 40 mg IV, Benadryl 25-50 mg IV and give patient IVF. Tammy notified of Dr. Nichola Sizer orders.

## 2012-09-06 ENCOUNTER — Encounter: Payer: Self-pay | Admitting: Family Medicine

## 2012-09-06 ENCOUNTER — Ambulatory Visit (INDEPENDENT_AMBULATORY_CARE_PROVIDER_SITE_OTHER): Payer: PRIVATE HEALTH INSURANCE | Admitting: Family Medicine

## 2012-09-06 VITALS — BP 107/73 | HR 70 | Temp 98.0°F | Ht 65.0 in | Wt 198.0 lb

## 2012-09-06 DIAGNOSIS — M109 Gout, unspecified: Secondary | ICD-10-CM

## 2012-09-06 DIAGNOSIS — I5032 Chronic diastolic (congestive) heart failure: Secondary | ICD-10-CM

## 2012-09-06 DIAGNOSIS — I10 Essential (primary) hypertension: Secondary | ICD-10-CM

## 2012-09-06 MED ORDER — COLCHICINE 0.6 MG PO TABS: 0.6000 mg | ORAL_TABLET | Freq: Every day | ORAL | Status: DC | PRN

## 2012-09-06 NOTE — Assessment & Plan Note (Signed)
First episode during recent hospitalization. Uric acid (serum) 10. Improved with prednisone and colchicine. Discussed prophylactic therapy; will not start at this time. Rx given for colchicine prn.

## 2012-09-06 NOTE — Progress Notes (Signed)
  Subjective:    Patient ID: [name], female    DOB: 20-May-1946, 66 y.o.   MRN: [id number]  HPI # Hospital follow-up for heart failure and gout Heart failure:   Her weight has been stable 197-199 lbs. She is taking Lasix 20 mg daily. She feels lightheaded sometimes. She denies chest pain but does have palpitations occasionally.   Gout:   She finished course of colchicine. Left foot pain is almost gone.   Review of Systems Per HPI Denies dyspnea, leg swelling Denies constipation  Allergies, medication, past medical history reviewed.  Significant for:  Atrial fibrillation on ASA 81 only because of history of GIB  History of GIB from AVM  Anxiety  Cardiac pacemaker for complete heart block     Objective:   Physical Exam GEN: NAD; obese NECK: no JVD; no HJR CV: RRR, normal S1/S2, no murmurs or gallops PULM: NI WOB; CTAB without rales EXT: no edema    Assessment & Plan:

## 2012-09-06 NOTE — Assessment & Plan Note (Addendum)
Hospitalized 09/13/-09/14 for exacerbation. Pro BNP > 2200 on admission. She received Lasix IV 20 mg.  Her exacerbation is now resolved.  She has been taking Lasix 20 mg daily. She seems to be at her dry weight without signs of fluid overload today. Due to her lower blood pressure and her occasional lightheadedness, I recommended decreasing Lasix to 10 mg daily and taking another 10 mg daily prn if she has signs of fluid overload.>>>EDW documented as around 180 #. Patient advised to continue Lasix 20 mg and monitor for signs of hypotension; if concerned call or make an appointment. She was advised to hold Lasix if unable to keep down fluids or persistent vomiting diarrhea.

## 2012-09-06 NOTE — Patient Instructions (Addendum)
Decrease Lasix to 10 mg a day You may take another 10 mg if you notice your legs swelling, difficulty breathing, and your weight going up by more than 2 lbs a day or 5 lbs in 1 week.   Follow-up in 1 month

## 2012-09-07 LAB — BASIC METABOLIC PANEL
BUN: 34 mg/dL — ABNORMAL HIGH (ref 6–23)
CO2: 30 mEq/L (ref 19–32)
Calcium: 9.4 mg/dL (ref 8.4–10.5)
Glucose, Bld: 90 mg/dL (ref 70–99)
Sodium: 139 mEq/L (ref 135–145)

## 2012-09-09 ENCOUNTER — Encounter: Payer: Self-pay | Admitting: Family Medicine

## 2012-09-10 ENCOUNTER — Telehealth: Payer: Self-pay | Admitting: *Deleted

## 2012-09-10 NOTE — Telephone Encounter (Signed)
Spoke with patient and she only received 2 B 12 injections at this time. She states she did not complete the B 12 series because her insurance only pays for so many per year.Also, discussed possible Feraheme with patient. She would like to discuss with Dr. [name] prior to trying this. Scheduled patient on 09/14/12 at 2:00 PM

## 2012-09-10 NOTE — Telephone Encounter (Signed)
Message copied by Hulan Saas on Fri Sep 10, 2012 10:11 AM ------      Message from: Hulan Saas      Created: Mon Mar 01, 2012  2:56 PM       Is patient due for B12 lab yet(6 months of B12 should be due on 9/23

## 2012-09-13 ENCOUNTER — Encounter: Payer: Self-pay | Admitting: Family Medicine

## 2012-09-14 ENCOUNTER — Ambulatory Visit (INDEPENDENT_AMBULATORY_CARE_PROVIDER_SITE_OTHER): Payer: Medicaid Other | Admitting: Internal Medicine

## 2012-09-14 ENCOUNTER — Encounter: Payer: Self-pay | Admitting: Internal Medicine

## 2012-09-14 VITALS — BP 124/78 | HR 72 | Ht 63.5 in | Wt 199.0 lb

## 2012-09-14 DIAGNOSIS — K5521 Angiodysplasia of colon with hemorrhage: Secondary | ICD-10-CM

## 2012-09-14 DIAGNOSIS — D649 Anemia, unspecified: Secondary | ICD-10-CM

## 2012-09-14 MED ORDER — HYDROCORTISONE ACETATE 25 MG RE SUPP: RECTAL | Status: DC

## 2012-09-14 MED ORDER — ESOMEPRAZOLE MAGNESIUM 40 MG PO CPDR: 40.0000 mg | DELAYED_RELEASE_CAPSULE | Freq: Every day | ORAL | Status: DC

## 2012-09-14 NOTE — Progress Notes (Signed)
[name] 1946-07-15 MRN [id number]   History of Present Illness:  This is a 66 year old African American female with chronic GI blood loss due to AV malformation of the stomach which was found on an upper endoscopy in Jan 2013. She had a  reaction to an Imfed infusion of test dose of 25 mg IV. She has chronic anemia despite taking oral iron supplements. She has been followed by Dr. Rayann Heman for complete heart block, chronic diastolic heart failure with an ejection fraction of 55% and atrial fibrillation. She is currently off Coumadin.because of GIB while on Coumadin. Her small bowel capsule endoscopy in January 2013 showed poor visualization of the small bowel but no discrete AVMs. Her last colonoscopy in August 2011 showed a tubular adenoma. She complains of constipation and clear mucus draining from the rectum. Her last hemoglobin was 9.2 and her hematocrit was 28.7. Her main complaint is fatigue and bloating discomfort in the upper abdomen. She has been taking Carafate 1 g twice a day and Prilosec 20 mg twice a day.   Past Medical History  Diagnosis Date  . Angioedema     2/2 ACE  . RLS (restless legs syndrome)     Dx 06/2007  . Anemia   . Arteriovenous malformation of stomach   . Permanent atrial fibrillation   . Renal failure     baseline creatinine 1.6  . Hyperlipidemia   . Depression   . Diastolic heart failure   . Arthritis   . Sleep apnea   . Asthma   . Hx of colonic polyp 08/13/10    adenomatous  . Fatty liver 07/26/10  . Panic attacks   . Hypertension   . Complete heart block     s/p PPM 1998  . Hypertrophic cardiomyopathy     dx by Dr [name] 2009  . Hx of colonoscopy   . GI bleed   . Panic attack   . DDD (degenerative disc disease)   . History of alcohol abuse Stopped Fall 2012  . History of tobacco use Quit Fall 2012  . Shortness of breath     sob on exertation  . CHF (congestive heart failure)   . Angina   . GERD (gastroesophageal reflux disease)     . AVM (arteriovenous malformation) of colon     small intestine; stomach  . Right arm pain 01/08/2012  . Hyperlipidemia   . Blood transfusion    Past Surgical History  Procedure Date  . Tubal ligation 04/01/2000  . Pacemaker insertion [place], most recent gen change by [name] 4/12  . Cardiac catheterization   . Esophagogastroduodenoscopy 12/23/2011    Procedure: ESOPHAGOGASTRODUODENOSCOPY (EGD);  Surgeon: [name], MD;  Location: [facility];  Service: Endoscopy;  Laterality: N/A;  . Givens capsule study 12/23/2011    Procedure: GIVENS CAPSULE STUDY;  Surgeon: [name], MD;  Location: [facility];  Service: Endoscopy;  Laterality: N/A;    reports that she quit smoking about 12 months ago. Her smoking use included Cigarettes. She has a 5 pack-year smoking history. She has never used smokeless tobacco. She reports that she does not drink alcohol or use illicit drugs. family history includes Alcohol abuse in her brother and father; Cancer in her brother; Colon cancer in her brother; Diabetes in her brother and sister; Heart attack in her brother and father; Heart disease in her mother and sister; Hypertension in her brother, father, mother, and sister; and Stroke in her father and  mother. Allergies  Allergen Reactions  . Ace Inhibitors     REACTION: angioedema        Review of Systems: Denies dysphagia heartburn constipation no visible blood per rectum  The remainder of the 10 point ROS is negative except as outlined in H&P   Physical Exam: General appearance  Well developed, in no distress. Eyes- non icteric. HEENT nontraumatic, normocephalic. Mouth no lesions, tongue papillated, no cheilosis. Neck supple without adenopathy, thyroid not enlarged, no carotid bruits, no JVD. Lungs Clear to auscultation bilaterally. Cor normal S1, normal S S2, quiet precordium. Abdomen: Protuberant soft with normoactive bowel sounds. Mild tenderness left lower quadrant. Liver not  enlarged. No ascites. Rectal: External hemorrhoidal tags. Normal rectal sphincter tone. Dark Hemoccult negative stool. Extremities no pedal edema. Skin no lesions. Neurological alert and oriented x 3. Psychological normal mood and affect.  Assessment and Plan:  Problem #1 Chronic GI blood loss from AVMs. Patient is currently stable and is Hemoccult-negative on today's exam. She had an allergic reaction to iron dextran. I have discussed the risks of intravenous iron infusion. We decided to wait and recheck her blood count in 4 weeks. If necessary, we may use Ferraheme preparation and premedicate the patient with solumedrol and Benadryl.  Problem #2 Constipation and rectal drainage, most likely from internal hemorrhoids. She will resume Anusol-HC suppositories. I have discussed a laxative regimen with her. She uses fleets enema. She may use Mag Oxide 500mg  or Dulcolox  suppositories. I also suggested Senokot  She says that the MiraLax doesn't work.  Problem #3 History of adenomatous colon polyps. A recall colonoscopy will be due in August 2016.   09/14/2012 Delfin Edis

## 2012-09-14 NOTE — Patient Instructions (Addendum)
We have sent the following medications to your pharmacy for you to pick up at your convenience: Anusol Nexium Please purchase Magnesium Oxide over the counter. Take 2 tablets daily for constipation. Your physician has requested that you go to the basement for the following lab work on 10/12/12:CBC  CC: Dr Karen Kays, DR Allred

## 2012-09-16 ENCOUNTER — Other Ambulatory Visit: Payer: Self-pay | Admitting: Family Medicine

## 2012-09-20 ENCOUNTER — Telehealth: Payer: Self-pay | Admitting: Family Medicine

## 2012-09-20 ENCOUNTER — Other Ambulatory Visit: Payer: Self-pay | Admitting: Family Medicine

## 2012-09-20 NOTE — Telephone Encounter (Signed)
Please call in Rx for Ativan. Thank you.

## 2012-09-20 NOTE — Telephone Encounter (Signed)
Pt was not sure wether to take 20mg  lasix or 10mg .  Consulted with Dr. Verdie Drown and pt is to take 20mg .  Fixed @ pharmacy and they will contact patient. [place], Salome Spotted

## 2012-09-20 NOTE — Telephone Encounter (Signed)
Patient is calling because there is a problem with fillin 3 of her medications.  She needs to speak to someone or someone needs to call [facility] on [address].

## 2012-09-20 NOTE — Telephone Encounter (Signed)
Consulted with Dr. Verdie Drown . She entered rx for Alprazolam but stated in note" call in Ativan." She states it is suppose to be aprazolam as is listed on med list.

## 2012-09-20 NOTE — Telephone Encounter (Signed)
Need rx sent to [facility] for the Carvedilol.  Do not have that med on file with them.  Please let patient  Know when rx has been sent.

## 2012-09-21 MED ORDER — CARVEDILOL 6.25 MG PO TABS: 6.2500 mg | ORAL_TABLET | ORAL | Status: DC

## 2012-09-21 NOTE — Telephone Encounter (Signed)
Please notify Rx has been sent.

## 2012-09-22 ENCOUNTER — Ambulatory Visit: Payer: PRIVATE HEALTH INSURANCE | Admitting: Family Medicine

## 2012-10-06 ENCOUNTER — Ambulatory Visit (HOSPITAL_COMMUNITY)
Admission: RE | Admit: 2012-10-06 | Discharge: 2012-10-06 | Disposition: A | Discharge status: Home / Self Care | Payer: PRIVATE HEALTH INSURANCE | Source: Ambulatory Visit | Attending: Family Medicine | Admitting: Family Medicine

## 2012-10-06 ENCOUNTER — Encounter: Payer: Self-pay | Admitting: Family Medicine

## 2012-10-06 ENCOUNTER — Ambulatory Visit (INDEPENDENT_AMBULATORY_CARE_PROVIDER_SITE_OTHER): Payer: PRIVATE HEALTH INSURANCE | Admitting: Family Medicine

## 2012-10-06 VITALS — BP 119/73 | HR 85 | Temp 98.5°F | Ht 63.5 in

## 2012-10-06 DIAGNOSIS — H1045 Other chronic allergic conjunctivitis: Secondary | ICD-10-CM

## 2012-10-06 DIAGNOSIS — R002 Palpitations: Secondary | ICD-10-CM

## 2012-10-06 DIAGNOSIS — H1013 Acute atopic conjunctivitis, bilateral: Secondary | ICD-10-CM

## 2012-10-06 DIAGNOSIS — R600 Localized edema: Secondary | ICD-10-CM

## 2012-10-06 DIAGNOSIS — R609 Edema, unspecified: Secondary | ICD-10-CM

## 2012-10-06 MED ORDER — CARVEDILOL 6.25 MG PO TABS: 6.2500 mg | ORAL_TABLET | ORAL | Status: DC

## 2012-10-06 MED ORDER — FUROSEMIDE 20 MG PO TABS: 20.0000 mg | ORAL_TABLET | Freq: Every day | ORAL | Status: DC

## 2012-10-06 MED ORDER — ALPRAZOLAM 0.5 MG PO TABS: 0.2500 mg | ORAL_TABLET | Freq: Every day | ORAL | Status: DC | PRN

## 2012-10-06 MED ORDER — ESOMEPRAZOLE MAGNESIUM 40 MG PO CPDR: 40.0000 mg | DELAYED_RELEASE_CAPSULE | Freq: Every day | ORAL | Status: DC

## 2012-10-06 NOTE — Progress Notes (Signed)
  Subjective:    Patient ID: [name], female    DOB: 1946/04/25, 66 y.o.   MRN: [id number]  HPI # Swelling in legs It is about the same as before She is taking Lasix 20 mg daily She just ordered compression stockings today Denies difficulty breathing, chest pain, but she has been having palpitations occasionally  # HTN, diastolic heart failure, complete heart block s/p pacemaker She is compliant with her anti-hypertensives She currently denies palpitations but did have them in the waiting room She takes an extra Coreg tablet sometimes when she gets palpitations and it resolves  Review of Systems Per HPI Denies syncope or lightheadedness  Allergies, medication, past medical history reviewed.      Objective:   Physical Exam GEN: NAD; well-nourished, -appearing CV: RRR, normal S1/S2, no murmurs; JVD 3 cm above sternal notch PULM: NI WOB; CTAB without wheezes or rales ABD: soft, NT, ND; no hepatojugular reflex EXT: 0-1+ pitting pretibial edema bilaterally; 2+ pedal pulses  ECG: ventricular paced rhythm Rate: 70 Rhythm: regular Axis: normal left-axis Intervals:    PR (normal 0.12-0.20): N/A P-wave (normal <0.12 duration, 0.25 mv amplitude): N/A QRS complex (normal 0.06-0.10): prolonged at 0.21 ST-T waves: diffuse concave ST elevation in most leads, not localized     Assessment & Plan:

## 2012-10-06 NOTE — Assessment & Plan Note (Signed)
She reports her dry weight to be around 180 but she is almost 200 lbs today and does not appear to be fluid overloaded except for mild lower extremity edema that I suspect dependent edema contributing Continue Lasix at 20 mg. Advised to notify us if her weight > 205 at any point.  She has ordered compression hose and will get them shortly Also advised low salt diet

## 2012-10-06 NOTE — Assessment & Plan Note (Signed)
ECG showed ventricular paced rhythm. Advised to follow-up with cardiologist if chest palpitations persistent.

## 2012-10-06 NOTE — Patient Instructions (Addendum)
Continue Lasix 20 mg a day If you weight > 205 lbs in the next month, please return to the clinic  Watch the salt in your diet and wear the compression stockings during the day when you will be walking  Please get a flu shot at your convenience   Follow-up in 3 months

## 2012-10-11 ENCOUNTER — Other Ambulatory Visit (INDEPENDENT_AMBULATORY_CARE_PROVIDER_SITE_OTHER): Payer: PRIVATE HEALTH INSURANCE

## 2012-10-11 ENCOUNTER — Other Ambulatory Visit: Payer: Self-pay | Admitting: *Deleted

## 2012-10-11 DIAGNOSIS — D649 Anemia, unspecified: Secondary | ICD-10-CM

## 2012-10-11 LAB — CBC WITH DIFFERENTIAL/PLATELET
Eosinophils Relative: 1.9 % (ref 0.0–5.0)
HCT: 29.7 % — ABNORMAL LOW (ref 36.0–46.0)
Hemoglobin: 9.6 g/dL — ABNORMAL LOW (ref 12.0–15.0)
Lymphocytes Relative: 28.1 % (ref 12.0–46.0)
Lymphs Abs: 1.2 10*3/uL (ref 0.7–4.0)
Monocytes Relative: 11.9 % (ref 3.0–12.0)
Platelets: 220 10*3/uL (ref 150.0–400.0)
WBC: 4.1 10*3/uL — ABNORMAL LOW (ref 4.5–10.5)

## 2012-10-13 ENCOUNTER — Encounter: Payer: Self-pay | Admitting: *Deleted

## 2012-10-15 ENCOUNTER — Telehealth: Payer: Self-pay | Admitting: Internal Medicine

## 2012-10-15 ENCOUNTER — Ambulatory Visit (INDEPENDENT_AMBULATORY_CARE_PROVIDER_SITE_OTHER): Payer: PRIVATE HEALTH INSURANCE | Admitting: *Deleted

## 2012-10-15 DIAGNOSIS — I4891 Unspecified atrial fibrillation: Secondary | ICD-10-CM

## 2012-10-15 NOTE — Progress Notes (Signed)
Pacer check in clinic  

## 2012-10-15 NOTE — Addendum Note (Signed)
Addended by: Sharlot Gowda on: 10/15/2012 04:09 PM   Modules accepted: Orders

## 2012-10-15 NOTE — Telephone Encounter (Signed)
Reviewed with Dr. Harrington Challenger who would like pt to come in to office to have device checked. I spoke with pt and she will have son bring her in.

## 2012-10-15 NOTE — Telephone Encounter (Signed)
Pt having fast heart rate, sob pls call [phone]

## 2012-10-15 NOTE — Telephone Encounter (Signed)
Spoke with pt. She reports she has had fluttering in chest off and on for 3-4 months. Saw primary MD last week for this and EKG done. Pt reports fluttering started about 2 hours ago worse than normal. It eases off and then comes back. Short of breath when heart fluttering. Reports feeling lightheaded when walking with fluttering.  No pain. Fluttering feeling came back and then eased off while I was on phone with pt.  Will review with Dr.Ross

## 2012-10-16 LAB — BASIC METABOLIC PANEL
CO2: 26 mEq/L (ref 19–32)
Calcium: 9.6 mg/dL (ref 8.4–10.5)
Chloride: 105 mEq/L (ref 96–112)
Glucose, Bld: 85 mg/dL (ref 70–99)
Sodium: 139 mEq/L (ref 135–145)

## 2012-10-19 LAB — PACEMAKER DEVICE OBSERVATION
BATTERY VOLTAGE: 2.79 V
RV LEAD IMPEDENCE PM: 467 Ohm
VENTRICULAR PACING PM: 99

## 2012-10-21 ENCOUNTER — Other Ambulatory Visit: Payer: Self-pay | Admitting: *Deleted

## 2012-10-21 ENCOUNTER — Encounter: Payer: Self-pay | Admitting: Internal Medicine

## 2012-10-21 ENCOUNTER — Ambulatory Visit (INDEPENDENT_AMBULATORY_CARE_PROVIDER_SITE_OTHER): Payer: Medicaid Other | Admitting: Internal Medicine

## 2012-10-21 VITALS — BP 124/66 | HR 69 | Ht 65.0 in | Wt 201.0 lb

## 2012-10-21 DIAGNOSIS — E7800 Pure hypercholesterolemia, unspecified: Secondary | ICD-10-CM

## 2012-10-21 DIAGNOSIS — I4891 Unspecified atrial fibrillation: Secondary | ICD-10-CM

## 2012-10-21 DIAGNOSIS — I442 Atrioventricular block, complete: Secondary | ICD-10-CM

## 2012-10-21 DIAGNOSIS — E785 Hyperlipidemia, unspecified: Secondary | ICD-10-CM

## 2012-10-21 DIAGNOSIS — I5032 Chronic diastolic (congestive) heart failure: Secondary | ICD-10-CM

## 2012-10-21 MED ORDER — COLCHICINE 0.6 MG PO TABS: 0.6000 mg | ORAL_TABLET | Freq: Every day | ORAL | Status: DC | PRN

## 2012-10-21 NOTE — Patient Instructions (Addendum)
Your physician wants you to follow-up in: 4 months with Kathrene Alu and 6 months in the device clinic You will receive a reminder letter in the mail two months in advance. If you don't receive a letter, please call our office to schedule the follow-up appointment.  Your physician recommends that you return for lab work at your convenience lipid/liver---FASTING

## 2012-10-21 NOTE — Telephone Encounter (Signed)
I called patient and notified her colchicine was sent over and to try that alone first.  She reports she has gout flare in her foot like she did previously.  If the gout in her foot is not improved in 2 days, I will considering sending prednisone at that time.

## 2012-10-21 NOTE — Telephone Encounter (Signed)
Patient came in to office to request refill of gout meds (Colcrys and Prednisone).  Has left leg and foot pain and swelling.  Patient went to cardiologist today and was told to request refill from her PCP.  Was given gout meds when she was discharged from hospital and started meds on Monday (10/18/12).  Will route request to Dr. Verdie Drown and call patient back.  Nolene Ebbs, RN

## 2012-10-22 LAB — PACEMAKER DEVICE OBSERVATION
BRDY-0002RV: 70 {beats}/min
BRDY-0004RV: 120 {beats}/min
RV LEAD THRESHOLD: 1 V

## 2012-10-24 NOTE — Assessment & Plan Note (Signed)
Rate controlled Not a candidate for anticoagulation at this time

## 2012-10-24 NOTE — Progress Notes (Signed)
PCP: OH PARK, ANGELA, MD  The patient presents today for routine cardiology followup. She appears to be doing well at this time.  She continues to have diffuse muscle soreness which did not improve with stopping of her crestor several months ago.   This has been present for several months.  Today, she denies symptoms of chest pain, shortness of breath, orthopnea, PND,presyncope, syncope, or neurologic sequela.  The patient feels that she is otherwise without complaint today.   Past Medical History  Diagnosis Date  . Angioedema     2/2 ACE  . RLS (restless legs syndrome)     Dx 06/2007  . Anemia   . Arteriovenous malformation of stomach   . Permanent atrial fibrillation   . Renal failure     baseline creatinine 1.6  . Hyperlipidemia   . Depression   . Diastolic heart failure   . Arthritis   . Sleep apnea   . Asthma   . Hx of colonic polyp 08/13/10    adenomatous  . Fatty liver 07/26/10  . Panic attacks   . Hypertension   . Complete heart block     s/p PPM 1998  . Hypertrophic cardiomyopathy     dx by Dr [name] 2009  . Hx of colonoscopy   . GI bleed   . Panic attack   . DDD (degenerative disc disease)   . History of alcohol abuse Stopped Fall 2012  . History of tobacco use Quit Fall 2012  . Shortness of breath     sob on exertation  . CHF (congestive heart failure)   . Angina   . GERD (gastroesophageal reflux disease)   . AVM (arteriovenous malformation) of colon     small intestine; stomach  . Right arm pain 01/08/2012  . Hyperlipidemia   . Blood transfusion    Past Surgical History  Procedure Date  . Tubal ligation 04/01/2000  . Pacemaker insertion [place], most recent gen change by [name] 4/12  . Cardiac catheterization   . Esophagogastroduodenoscopy 12/23/2011    Procedure: ESOPHAGOGASTRODUODENOSCOPY (EGD);  Surgeon: [name], MD;  Location: [facility];  Service: Endoscopy;  Laterality: N/A;  . Givens capsule study 12/23/2011    Procedure: GIVENS CAPSULE  STUDY;  Surgeon: [name], MD;  Location: [facility];  Service: Endoscopy;  Laterality: N/A;    Current Outpatient Prescriptions  Medication Sig Dispense Refill  . albuterol (PROVENTIL HFA;VENTOLIN HFA) 108 (90 BASE) MCG/ACT inhaler Inhale 2 puffs into the lungs every 4 (four) hours as needed. For shortness of breath       . ALPRAZolam (XANAX) 0.5 MG tablet Take 0.5 tablets (0.25 mg total) by mouth daily as needed for sleep.  15 tablet  0  . aspirin 81 MG tablet Take 81 mg by mouth daily.      . Calcium Carb-Cholecalciferol (CALCIUM-VITAMIN D) 600-400 MG-UNIT TABS Take 1 tablet by mouth 2 (two) times daily.  60 tablet  12  . carvedilol (COREG) 6.25 MG tablet Take 2 tablets AM and 1 1/2 tablet PM.      . Coenzyme Q10 (COQ10) 100 MG CAPS Take 1 capsule by mouth as needed.      [name] esomeprazole (NEXIUM) 40 MG capsule Take 1 capsule (40 mg total) by mouth daily before breakfast.  30 capsule  3  . estradiol (ESTRACE) 0.1 MG/GM vaginal cream 2 grams daily for 2 weeks, then 2 grams twice weekly for 2 weeks, then 2 grams weekly.  42.5 g  12  . ferrous sulfate 325 (65 FE) MG tablet TAKE 1 TABLET BY MOUTH TWICE DAILY WITH MEALS (TAKE TO KEEP YOUR BLOOD COUNTS UP)  60 tablet  3  . furosemide (LASIX) 20 MG tablet Take 1 tablet (20 mg total) by mouth daily.  90 tablet  3  . hydrocortisone (ANUSOL-HC) 25 MG suppository Insert one nightly  12 suppository  2  . Multiple Vitamin (MULITIVITAMIN WITH MINERALS) TABS Take 1 tablet by mouth daily.      . nitroGLYCERIN (NITROSTAT) 0.4 MG SL tablet Place 0.4 mg under the tongue every 5 (five) minutes as needed. For chest pain      . Olopatadine HCl 0.2 % SOLN Apply 1 drop to eye daily as needed. For allergies      . sucralfate (CARAFATE) 1 G tablet Take 1 tablet (1 g total) by mouth 2 (two) times daily.  60 tablet  1  . terbinafine (LAMISIL) 250 MG tablet Take 1 tablet (250 mg total) by mouth daily. For toenail infection  30 tablet  4  . colchicine 0.6 MG tablet  Take 1 tablet (0.6 mg total) by mouth daily as needed (gout flare ).  5 tablet  0   Current Facility-Administered Medications  Medication Dose Route Frequency Provider Last Rate Last Dose  . cyanocobalamin ((VITAMIN B-12)) injection 1,000 mcg  1,000 mcg Intramuscular Q30 days [name], MD   1,000 mcg at 09/16/11 1341    Allergies  Allergen Reactions  . Ace Inhibitors     REACTION: angioedema    History   Social History  . Marital Status: Divorced    Spouse Name: N/A    Number of Children: 95  . Years of Education: N/A   Occupational History  . disabled    Social History Main Topics  . Smoking status: Former Smoker -- 0.1 packs/day for 50 years    Types: Cigarettes    Quit date: 08/16/2011  . Smokeless tobacco: Never Used     Comment: quit x 3 months  . Alcohol Use: No     Comment: quit 08/16/2011  . Drug Use: No  . Sexually Active: Not on file   Other Topics Concern  . Not on file   Social History Narrative   On disability for heart failure/pacemaker.  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 4 grown children,  8 grandchildren.  Drinks alcohol a few times a week socially. At one time, the most she reports drinking is about 3 mixed drinks.Lives with daughter [name] and son. Will be moving 04/2011 to different house because concerned children are taking advantage of her financial situation. No illicit drugs.Smokes few cigarettes daily, especially when stressed. Started smoking @ age 74. Quit January 2012 2/2 health but started smoking again 02/2011.     Family History  Problem Relation Age of Onset  . Hypertension Mother   . Stroke Mother   . Hypertension Father   . Stroke Father   . Hypertension Sister   . Hypertension Brother   . Colon cancer Brother   . Diabetes Sister   . Diabetes Brother   . Cancer Brother   . Heart attack Brother   . Heart attack Father   . Heart disease Mother   . Heart disease Sister   . Alcohol abuse Brother   .  Alcohol abuse Father      Physical Exam: Filed Vitals:   10/21/12 1340  BP: 124/66  Pulse: 69  Height:  5\' 5"  (1.651 m)  Weight: 201 lb (91.173 kg)  SpO2: 99%    GEN- The patient is well appearing, alert and oriented x 3 today.   Head- normocephalic, atraumatic Eyes-  Sclera clear, conjunctiva pink Ears- hearing intact Oropharynx- clear Neck- supple, no JVP Lymph- no cervical lymphadenopathy Lungs- Clear to ausculation bilaterally, normal work of breathing Chest- pacemaker pocket is well healed Heart- Regular rate and rhythm (paced) GI- soft, NT, ND, + BS Extremities- no clubbing, cyanosis, or edema  Pacemaker interrogation- reviewed in detail today,  See PACEART report  Assessment and Plan:

## 2012-10-24 NOTE — Assessment & Plan Note (Signed)
I previously stopped her statin due to myalgias.  These have not improved suggesting that her statin may have not been the cause. I will obtain fasting lipids and LFTs and then make a decision about restarting a statin based on the results.

## 2012-10-24 NOTE — Assessment & Plan Note (Signed)
Normal pacemaker function See [name] Art report No changes today  Return to the device clinic in 6 months

## 2012-10-24 NOTE — Assessment & Plan Note (Signed)
Stable No change required today  

## 2012-10-27 ENCOUNTER — Telehealth: Payer: Self-pay | Admitting: Family Medicine

## 2012-10-27 NOTE — Telephone Encounter (Signed)
Been taking her gout meds and is needing to talk to nurse about this.  Is concerned about it bothering her kidneys

## 2012-10-27 NOTE — Telephone Encounter (Signed)
Patient  states she  Had had back pain for a week . Pharmacist told her that allopurinol could affect kidneys and she is concerned about this.  Explained that kidney functions are checked with labs  but she wants to make sure kidneys are all right and wants to come in about back pain. Appointment scheduled tomorrow.

## 2012-10-28 ENCOUNTER — Encounter: Payer: Self-pay | Admitting: Family Medicine

## 2012-10-28 ENCOUNTER — Ambulatory Visit (HOSPITAL_COMMUNITY)
Admission: RE | Admit: 2012-10-28 | Discharge: 2012-10-28 | Disposition: A | Discharge status: Home / Self Care | Payer: PRIVATE HEALTH INSURANCE | Source: Ambulatory Visit | Attending: Family Medicine | Admitting: Family Medicine

## 2012-10-28 ENCOUNTER — Ambulatory Visit (INDEPENDENT_AMBULATORY_CARE_PROVIDER_SITE_OTHER): Payer: PRIVATE HEALTH INSURANCE | Admitting: Family Medicine

## 2012-10-28 VITALS — BP 136/84 | HR 70 | Ht 65.0 in | Wt 199.0 lb

## 2012-10-28 DIAGNOSIS — M549 Dorsalgia, unspecified: Secondary | ICD-10-CM

## 2012-10-28 DIAGNOSIS — M5137 Other intervertebral disc degeneration, lumbosacral region: Secondary | ICD-10-CM | POA: Insufficient documentation

## 2012-10-28 DIAGNOSIS — M51379 Other intervertebral disc degeneration, lumbosacral region without mention of lumbar back pain or lower extremity pain: Secondary | ICD-10-CM | POA: Insufficient documentation

## 2012-10-28 DIAGNOSIS — M109 Gout, unspecified: Secondary | ICD-10-CM

## 2012-10-28 MED ORDER — COLCHICINE 0.6 MG PO TABS: 0.6000 mg | ORAL_TABLET | Freq: Every day | ORAL | Status: DC

## 2012-10-28 NOTE — Assessment & Plan Note (Signed)
Back stiffness, possibly from poor mattress support while sleeping.  No red flags except for age.   Will obatin complete lumbar Xray given length of coarse.  Advised 2 tylenol before bed, only once to see if it works- i think duration of action will not likely affect the pain.  Advised 2 tylenol early AM to help pain dissipate faster.   Return to see PCP in two weeks if no improvement Will advise heat when I call with the XR results.

## 2012-10-28 NOTE — Patient Instructions (Addendum)
Thanks for coming in today!  Use tylenol, 2 pills before bed to see if it helps your pain.  Try two pills in the morning and do not use it any longer at night if that does not help.  We will xray your back and I'll call you with the results  Come back in 2 weeks to see your PCP if your back pain does not improve.    Gout Gout is an inflammatory condition (arthritis) caused by a buildup of uric acid crystals in the joints. Uric acid is a chemical that is normally present in the blood. Under some circumstances, uric acid can form into crystals in your joints. This causes joint redness, soreness, and swelling (inflammation). Repeat attacks are common. Over time, uric acid crystals can form into masses (tophi) near a joint, causing disfigurement. Gout is treatable and often preventable. CAUSES  The disease begins with elevated levels of uric acid in the blood. Uric acid is produced by your body when it breaks down a naturally found substance called purines. This also happens when you eat certain foods such as meats and fish. Causes of an elevated uric acid level include:  Being passed down from parent to child (heredity).  Diseases that cause increased uric acid production (obesity, psoriasis, some cancers).  Excessive alcohol use.  Diet, especially diets rich in meat and seafood.  Medicines, including certain cancer-fighting drugs (chemotherapy), diuretics, and aspirin.  Chronic kidney disease. The kidneys are no longer able to remove uric acid well.  Problems with metabolism. Conditions strongly associated with gout include:  Obesity.  High blood pressure.  High cholesterol.  Diabetes. Not everyone with elevated uric acid levels gets gout. It is not understood why some people get gout and others do not. Surgery, joint injury, and eating too much of certain foods are some of the factors that can lead to gout. SYMPTOMS   An attack of gout comes on quickly. It causes intense pain  with redness, swelling, and warmth in a joint.  Fever can occur.  Often, only one joint is involved. Certain joints are more commonly involved:  Base of the big toe.  Knee.  Ankle.  Wrist.  Finger. Without treatment, an attack usually goes away in a few days to weeks. Between attacks, you usually will not have symptoms, which is different from many other forms of arthritis. DIAGNOSIS  Your caregiver will suspect gout based on your symptoms and exam. Removal of fluid from the joint (arthrocentesis) is done to check for uric acid crystals. Your caregiver will give you a medicine that numbs the area (local anesthetic) and use a needle to remove joint fluid for exam. Gout is confirmed when uric acid crystals are seen in joint fluid, using a special microscope. Sometimes, blood, urine, and X-ray tests are also used. TREATMENT  There are 2 phases to gout treatment: treating the sudden onset (acute) attack and preventing attacks (prophylaxis). Treatment of an Acute Attack  Medicines are used. These include anti-inflammatory medicines or steroid medicines.  An injection of steroid medicine into the affected joint is sometimes necessary.  The painful joint is rested. Movement can worsen the arthritis.  You may use warm or cold treatments on painful joints, depending which works best for you.  Discuss the use of coffee, vitamin C, or cherries with your caregiver. These may be helpful treatment options. Treatment to Prevent Attacks After the acute attack subsides, your caregiver may advise prophylactic medicine. These medicines either help your kidneys eliminate uric  acid from your body or decrease your uric acid production. You may need to stay on these medicines for a very long time. The early phase of treatment with prophylactic medicine can be associated with an increase in acute gout attacks. For this reason, during the first few months of treatment, your caregiver may also advise you to  take medicines usually used for acute gout treatment. Be sure you understand your caregiver's directions. You should also discuss dietary treatment with your caregiver. Certain foods such as meats and fish can increase uric acid levels. Other foods such as dairy can decrease levels. Your caregiver can give you a list of foods to avoid. HOME CARE INSTRUCTIONS   Do not take aspirin to relieve pain. This raises uric acid levels.  Only take over-the-counter or prescription medicines for pain, discomfort, or fever as directed by your caregiver.  Rest the joint as much as possible. When in bed, keep sheets and blankets off painful areas.  Keep the affected joint raised (elevated).  Use crutches if the painful joint is in your leg.  Drink enough water and fluids to keep your urine clear or pale yellow. This helps your body get rid of uric acid. Do not drink alcoholic beverages. They slow the passage of uric acid.  Follow your caregiver's dietary instructions. Pay careful attention to the amount of protein you eat. Your daily diet should emphasize fruits, vegetables, whole grains, and fat-free or low-fat milk products.  Maintain a healthy body weight. SEEK MEDICAL CARE IF:   You have an oral temperature above 102 F (38.9 C).  You develop diarrhea, vomiting, or any side effects from medicines.  You do not feel better in 24 hours, or you are getting worse. SEEK IMMEDIATE MEDICAL CARE IF:   Your joint becomes suddenly more tender and you have:  Chills.  An oral temperature above 102 F (38.9 C), not controlled by medicine. MAKE SURE YOU:   Understand these instructions.  Will watch your condition.  Will get help right away if you are not doing well or get worse. Document Released: 11/28/2000 Document Revised: 02/23/2012 Document Reviewed: 03/11/2010 [facility] Patient Information 2013 [place].

## 2012-10-28 NOTE — Progress Notes (Signed)
  Subjective:    Patient ID: [name], female    DOB: 06-30-1946, 66 y.o.   MRN: [id number]  HPI Pt here for acute visit of back pain Notes back "soreness" also described as achy, 5-6/10 pain, non radiating, started about 3-4 weeks ago.  Only in the AM when she wakes up and lasts about 1 to 2 hours Improves with sitting in a chair and putting pillows behind her Cant stand up straight right away in the am  No Hx of fall, injury or strain No Hx of back pain No Bowel or bladder dysf No dysuria or foul smelling urine.  No gait instability or falling No other joint stiffness in the am Has not tried any meds  Myalgias about 1 month ago that resolved after her cardiologist stopped crestor.  Patient worried about her kidneys assoc with colchicine use  Thinks she is currently having gout attack with L foot pain.    Review of Systems Per HPI    Objective:   Physical Exam  Gen: NAD, alert, cooperative with exam HEENT: NCAT, EOMI, PERRL CV: RRR, no murmur Resp: CTABL, no wheezes, non-labored Ext: No edema, no tenderness to palpation of L and R feet joints Neuro: Alert and oriented, No gross deficits, sensation intact in BL LE, 5/5 strength in BL LE, Normal Gait, stands easily on her own Psych: very tangential thought process, appropriate mood and affect.      Assessment & Plan:

## 2012-10-28 NOTE — Assessment & Plan Note (Signed)
No current objective flare. Completely non-tender on all joints of BL feet Patient having pain similar to previous flare  Rx of 0.6 mg colchicine, 1 daily during a flare.  Cr Clearance 42. 0.6 can be used as prophylactic dose but I advised only PRN

## 2012-10-29 ENCOUNTER — Telehealth: Payer: Self-pay | Admitting: Family Medicine

## 2012-10-29 NOTE — Telephone Encounter (Signed)
Called to inform her that her back x ray showed no signs of fracture. She had some relief with tylenol, encouraged continuing to use that. Also suggested heat for 15 minutes at a time. Reinforced to take colcrys only for gout pain, not necessarily for the back.

## 2012-11-15 ENCOUNTER — Encounter: Payer: Self-pay | Admitting: Internal Medicine

## 2012-11-15 ENCOUNTER — Other Ambulatory Visit: Payer: Self-pay | Admitting: Family Medicine

## 2012-11-15 NOTE — Telephone Encounter (Signed)
Please call in Xanax for patient. Thank you.

## 2012-11-16 NOTE — Telephone Encounter (Signed)
Xanax called in as written.

## 2012-12-16 ENCOUNTER — Telehealth: Payer: Self-pay | Admitting: *Deleted

## 2012-12-16 NOTE — Telephone Encounter (Signed)
Spoke with patient and she will come for labs within the next week.

## 2012-12-16 NOTE — Telephone Encounter (Signed)
Message copied by Hulan Saas on Thu Dec 16, 2012 11:22 AM ------      Message from: Hulan Saas      Created: Mon Oct 11, 2012  4:26 PM       Call and remind CBC due on 12/13/12 DB

## 2012-12-20 ENCOUNTER — Other Ambulatory Visit (INDEPENDENT_AMBULATORY_CARE_PROVIDER_SITE_OTHER): Payer: PRIVATE HEALTH INSURANCE

## 2012-12-20 ENCOUNTER — Other Ambulatory Visit: Payer: Self-pay | Admitting: Family Medicine

## 2012-12-20 DIAGNOSIS — D649 Anemia, unspecified: Secondary | ICD-10-CM

## 2012-12-20 LAB — CBC WITH DIFFERENTIAL/PLATELET
Basophils Relative: 0.5 % (ref 0.0–3.0)
Eosinophils Relative: 3 % (ref 0.0–5.0)
HCT: 27.2 % — ABNORMAL LOW (ref 36.0–46.0)
Hemoglobin: 8.9 g/dL — ABNORMAL LOW (ref 12.0–15.0)
Lymphs Abs: 1 10*3/uL (ref 0.7–4.0)
Monocytes Relative: 12.9 % — ABNORMAL HIGH (ref 3.0–12.0)
Neutro Abs: 2.4 10*3/uL (ref 1.4–7.7)
RBC: 3.2 Mil/uL — ABNORMAL LOW (ref 3.87–5.11)
RDW: 14.7 % — ABNORMAL HIGH (ref 11.5–14.6)

## 2012-12-21 ENCOUNTER — Other Ambulatory Visit: Payer: Self-pay | Admitting: *Deleted

## 2012-12-21 DIAGNOSIS — D509 Iron deficiency anemia, unspecified: Secondary | ICD-10-CM

## 2012-12-27 ENCOUNTER — Other Ambulatory Visit (HOSPITAL_COMMUNITY): Payer: Self-pay | Admitting: Internal Medicine

## 2012-12-28 ENCOUNTER — Encounter (HOSPITAL_COMMUNITY): Payer: Self-pay

## 2012-12-28 ENCOUNTER — Encounter (HOSPITAL_COMMUNITY)
Admission: RE | Admit: 2012-12-28 | Discharge: 2012-12-28 | Disposition: A | Discharge status: Home / Self Care | Payer: PRIVATE HEALTH INSURANCE | Source: Ambulatory Visit | Attending: Internal Medicine | Admitting: Internal Medicine

## 2012-12-28 VITALS — BP 136/88 | HR 70 | Temp 98.4°F | Resp 18

## 2012-12-28 DIAGNOSIS — D509 Iron deficiency anemia, unspecified: Secondary | ICD-10-CM | POA: Insufficient documentation

## 2012-12-28 MED ORDER — METHYLPREDNISOLONE SODIUM SUCC 40 MG IJ SOLR
20.0000 mg | Freq: Once | INTRAMUSCULAR | Status: AC
  Administered 2012-12-28: 20 mg via INTRAVENOUS
  Filled 2012-12-28: qty 1

## 2012-12-28 MED ORDER — SODIUM CHLORIDE 0.9 % IV SOLN: 25.0000 mg | Freq: Once | INTRAVENOUS | Status: DC

## 2012-12-28 MED ORDER — SODIUM CHLORIDE 0.9 % IV SOLN
1020.0000 mg | Freq: Once | INTRAVENOUS | Status: AC
  Administered 2012-12-28: 1020 mg via INTRAVENOUS
  Filled 2012-12-28: qty 34

## 2012-12-28 MED ORDER — DIPHENHYDRAMINE HCL 50 MG/ML IJ SOLN
25.0000 mg | Freq: Once | INTRAMUSCULAR | Status: AC
  Administered 2012-12-28: 25 mg via INTRAVENOUS
  Filled 2012-12-28: qty 1

## 2012-12-28 MED ORDER — SODIUM CHLORIDE 0.9 % IV SOLN
INTRAVENOUS | Status: AC
  Administered 2012-12-28: 15:00:00 via INTRAVENOUS

## 2012-12-29 ENCOUNTER — Telehealth: Payer: Self-pay | Admitting: *Deleted

## 2012-12-29 ENCOUNTER — Other Ambulatory Visit (INDEPENDENT_AMBULATORY_CARE_PROVIDER_SITE_OTHER): Payer: PRIVATE HEALTH INSURANCE

## 2012-12-29 DIAGNOSIS — D509 Iron deficiency anemia, unspecified: Secondary | ICD-10-CM

## 2012-12-29 LAB — CBC WITH DIFFERENTIAL/PLATELET
Basophils Relative: 0.2 % (ref 0.0–3.0)
Eosinophils Relative: 0 % (ref 0.0–5.0)
Lymphocytes Relative: 13.3 % (ref 12.0–46.0)
MCV: 84.4 fl (ref 78.0–100.0)
Monocytes Relative: 11.3 % (ref 3.0–12.0)
Neutrophils Relative %: 75.2 % (ref 43.0–77.0)
Platelets: 262 10*3/uL (ref 150.0–400.0)
RBC: 3.35 Mil/uL — ABNORMAL LOW (ref 3.87–5.11)
WBC: 7 10*3/uL (ref 4.5–10.5)

## 2012-12-29 NOTE — Telephone Encounter (Signed)
Message copied by Hulan Saas on Wed Dec 29, 2012  8:54 AM ------      Message from: Hulan Saas      Created: Mon Dec 27, 2012  8:16 AM       Call and remind patient due for CBC on 12/28/12.DB

## 2012-12-29 NOTE — Telephone Encounter (Signed)
Spoke with patient and reminded her of lab recheck. She tolerated the iron infusion well yesterday.

## 2013-01-05 ENCOUNTER — Ambulatory Visit (INDEPENDENT_AMBULATORY_CARE_PROVIDER_SITE_OTHER): Payer: PRIVATE HEALTH INSURANCE | Admitting: Family Medicine

## 2013-01-05 ENCOUNTER — Encounter: Payer: Self-pay | Admitting: Family Medicine

## 2013-01-05 VITALS — BP 108/73 | Temp 98.2°F | Ht 65.0 in | Wt 192.0 lb

## 2013-01-05 DIAGNOSIS — I10 Essential (primary) hypertension: Secondary | ICD-10-CM

## 2013-01-05 DIAGNOSIS — R002 Palpitations: Secondary | ICD-10-CM

## 2013-01-05 DIAGNOSIS — Q2730 Arteriovenous malformation, site unspecified: Secondary | ICD-10-CM

## 2013-01-05 DIAGNOSIS — Q279 Congenital malformation of peripheral vascular system, unspecified: Secondary | ICD-10-CM

## 2013-01-05 MED ORDER — ALPRAZOLAM 0.5 MG PO TABS: 0.5000 mg | ORAL_TABLET | Freq: Every day | ORAL | Status: DC | PRN

## 2013-01-05 NOTE — Assessment & Plan Note (Signed)
Persistent.  See above. May be anxiety-related due to family stressors.  Discouraged increasing use of Xanax but we discussed coping mechanisms.  She has been on this dose of Xanax for a long time, so we will continue for now. We discussed how I would not go up on this medication due to concern for tolerance, dependence.

## 2013-01-05 NOTE — Patient Instructions (Signed)
Try to think of other ways to handle your stress.  Pray. : )  Be careful about how often you take the Xanax.  Follow-up in June or sooner if needed

## 2013-01-05 NOTE — Assessment & Plan Note (Signed)
Her blood pressure is on the lower side. She denies symptoms, however.  We will monitor for now. Her cardiologist had increased her beta-blocker due to concern for heart palpitations, which may be due to her anxiety as well.

## 2013-01-05 NOTE — Assessment & Plan Note (Signed)
Documentation only. She received a dose of ferraheme recently for anemia.

## 2013-01-05 NOTE — Progress Notes (Signed)
  Subjective:    Patient ID: [name], female    DOB: 07-25-1946, 67 y.o.   MRN: [id number]  HPI Medication refill visit  # Hypertension, congestive heart failure diastolic, history of complete heart block, s/p pacemaker She is followed by Cardiology who went up on her beta-blocker because of her having palpitations She is not sure if this helped She denies lightheadedness or falls  # She also endorses more anxiety attacks and has been taking her Xanax every 2-3 days She is under more family stress at home now that her son has moved back in with her She continues to abstain from smoking  Review of Systems Denies fevers, chills Denies abdominal pain Denies blood in sputum or stool Denies chest pain or difficulty breathing  Allergies, medication, past medical history reviewed.  Smoking status noted. -AVM, anemia--followed by GI    Objective:   Physical Exam Gen: NAD; well-appearing; obese PSYCH: pleasant, mildly anxious-appearing, engaged and normally conversant, appropriate to questions, alert and oriented CV: RRR, normal S1/S2, no m/r/g PULM: NI WOB; CTAB without w/r/r EXT: no edema    Assessment & Plan:

## 2013-01-19 ENCOUNTER — Other Ambulatory Visit: Payer: Self-pay | Admitting: Family Medicine

## 2013-02-06 ENCOUNTER — Telehealth: Payer: Self-pay | Admitting: Family Medicine

## 2013-02-06 NOTE — Telephone Encounter (Signed)
Please have patient make an appointment to get form filled for personal care services.

## 2013-02-15 ENCOUNTER — Telehealth: Payer: Self-pay | Admitting: Internal Medicine

## 2013-02-15 NOTE — Telephone Encounter (Signed)
I have spoken with HHN and asked her if the patient has swelling in her leg and it is warm to the touch.  She needs to see her PCP and make sure she does not have cellulitis.  If she has fluttering that will not stop she can call me and I will be glad to discuss her symptoms with her and talk with Dr Rayann Heman.  She does have an appointment tomorrow with her PCP

## 2013-02-15 NOTE — Telephone Encounter (Signed)
New problem   Vickie/Case Manager [employer] is sending a fax of the symptoms the pt is having. She want you to call the pt concerning this matter. If you have any question concerning fax Vickie will be in the office until 4:30.

## 2013-02-16 ENCOUNTER — Ambulatory Visit (HOSPITAL_COMMUNITY)
Admission: RE | Admit: 2013-02-16 | Discharge: 2013-02-16 | Disposition: A | Discharge status: Home / Self Care | Payer: PRIVATE HEALTH INSURANCE | Source: Ambulatory Visit | Attending: Family Medicine | Admitting: Family Medicine

## 2013-02-16 ENCOUNTER — Encounter: Payer: Self-pay | Admitting: Family Medicine

## 2013-02-16 ENCOUNTER — Ambulatory Visit (INDEPENDENT_AMBULATORY_CARE_PROVIDER_SITE_OTHER): Payer: PRIVATE HEALTH INSURANCE | Admitting: Family Medicine

## 2013-02-16 VITALS — BP 128/78 | HR 70 | Temp 98.3°F | Ht 65.0 in | Wt 194.7 lb

## 2013-02-16 DIAGNOSIS — M25561 Pain in right knee: Secondary | ICD-10-CM | POA: Insufficient documentation

## 2013-02-16 DIAGNOSIS — M25469 Effusion, unspecified knee: Secondary | ICD-10-CM | POA: Insufficient documentation

## 2013-02-16 DIAGNOSIS — M25569 Pain in unspecified knee: Secondary | ICD-10-CM | POA: Insufficient documentation

## 2013-02-16 MED ORDER — COLCHICINE 0.6 MG PO TABS: 0.6000 mg | ORAL_TABLET | Freq: Every day | ORAL | Status: DC

## 2013-02-16 MED ORDER — PREDNISONE 20 MG PO TABS: 40.0000 mg | ORAL_TABLET | Freq: Every day | ORAL | Status: DC

## 2013-02-16 NOTE — Patient Instructions (Addendum)
You may have a gout flare or arthritis in knee.  Get your xrays when you can. Take one colchicine tablet daily. Start prednisone for 4 days. Do not drink alcohol, eat extra meat/protein, salty foods. Make appointment with Dr. Candace [facility] in 1-2 weeks for check up. If swelling gets worse, you have fever or trouble walking then call doctor immediately.  Gout Gout is caused by a buildup of uric acid crystals in the joints. The crystals make your joints sore. This is like having sand in your joints. Repeat attacks are common. Gout can be treated. HOME CARE   Do not take aspirin for pain.  Only take medicine as told by your doctor.  You may use cold treatments (ice) on painful joints.  Put ice in a plastic bag.  Place a towel between your skin and the bag.  Leave the ice on for 15 to 20 minutes at a time, 3 to 4 times a day.  Rest in bed as much as possible. When in bed, keep the sheets and blankets off your sore joints.  Keep the sore joints raised (elevated).  Use crutches if your legs or ankles hurt.  Drink enough water and fluids to keep your pee (urine) clear or pale yellow. This helps your body get rid of uric acid. Do not drink alcohol.  Follow diet instructions as told by your doctor.  Keep your body at a healthy weight. GET HELP RIGHT AWAY IF:   You have a temperature by mouth above 102 F (38.9 C), not controlled by medicine.  You have watery poop (diarrhea).  You are throwing up (vomiting).  You do not feel better in 1 day, or you are getting worse.  Your joint hurts more.  You have the chills. MAKE SURE YOU:   Understand these instructions.  Will watch your condition.  Will get help right away if you are not doing well or get worse. Document Released: 09/09/2008 Document Revised: 02/23/2012 Document Reviewed: 03/11/2010 [facility] Patient Information 2013 [place].

## 2013-02-16 NOTE — Progress Notes (Signed)
  Subjective:    Patient ID: [name], female    DOB: [birth year], 67 y.o.   MRN: [id number]  Knee Pain    1. Right knee pain. Started acutely about 4 days ago. She said on Sunday night noticed it was warm, red and swollen. Since that time the redness and swelling have improved, but she still feels pain with walking and movement. Denies any trauma or injury. No history of previous. She has a diagnosis of gout from few years ago when her left foot became swollen and warm, was treated with colchicine then. She took one colchicine tablet in past 4 days that didn't seem to help immediately.  She did eat some liver last week prior to onset of symptoms.   Review of Systems Denies fever, chills, calf pain, rash, malaise, sores, other joint pains/swelling alcohol use, falls, weakness, immobility.    Objective:   Physical Exam  Vitals reviewed. Constitutional: She is oriented to person, place, and time. She appears well-developed and well-nourished. No distress.  HENT:  Head: Normocephalic and atraumatic.  Cardiovascular: Normal rate, regular rhythm and normal heart sounds.   Pulmonary/Chest: Effort normal.  Musculoskeletal: She exhibits edema and tenderness.  Right knee lacks about 15 degree flexion, extension appears intact. Perhaps a trace effusion and trace warmth to right compared to left; however bilateral knees have a prominent anterior fat pad and difficult to distinguish any definite effusion. TTP in anterior aspect and B joint lines.  Negative ligament laxity.  No calf tenderness, no erythema or cords noted.   Neurological: She is alert and oriented to person, place, and time.  Skin: She is not diaphoretic.  Psychiatric: She has a normal mood and affect.          Assessment & Plan:

## 2013-02-16 NOTE — Assessment & Plan Note (Signed)
Most likely acute gout attack vs flare of OA given mono-articular presentation. Since this has already improved and no systemic signs, not likely infectious etiology. Do not think attempt at aspiration will yield significant effusion, thus not attempted. Will check plain film. Continue treatment empirically for gout given her hx of hyperuricemia and probably clinical diagnosis. Colchicine renewed for daily prn and prednisone x 4 days.  Counseled regarding avoidance of organ meats, alcohol. Advised RTC in 2 weeks or sooner if worsens. She may benefit from chronic urate lowering therapy in long term.

## 2013-02-17 ENCOUNTER — Ambulatory Visit: Payer: PRIVATE HEALTH INSURANCE | Admitting: Family Medicine

## 2013-02-21 ENCOUNTER — Ambulatory Visit (INDEPENDENT_AMBULATORY_CARE_PROVIDER_SITE_OTHER): Payer: PRIVATE HEALTH INSURANCE | Admitting: Nurse Practitioner

## 2013-02-21 ENCOUNTER — Encounter: Payer: Self-pay | Admitting: Nurse Practitioner

## 2013-02-21 VITALS — BP 100/80 | HR 64 | Ht 65.0 in | Wt 191.1 lb

## 2013-02-21 DIAGNOSIS — E785 Hyperlipidemia, unspecified: Secondary | ICD-10-CM

## 2013-02-21 DIAGNOSIS — I4891 Unspecified atrial fibrillation: Secondary | ICD-10-CM

## 2013-02-21 LAB — HEPATIC FUNCTION PANEL
ALT: 11 U/L (ref 0–35)
AST: 22 U/L (ref 0–37)
Albumin: 3.7 g/dL (ref 3.5–5.2)
Alkaline Phosphatase: 44 U/L (ref 39–117)
Bilirubin, Direct: 0 mg/dL (ref 0.0–0.3)
Total Bilirubin: 0.3 mg/dL (ref 0.3–1.2)
Total Protein: 7.1 g/dL (ref 6.0–8.3)

## 2013-02-21 LAB — BASIC METABOLIC PANEL
BUN: 36 mg/dL — ABNORMAL HIGH (ref 6–23)
CO2: 24 mEq/L (ref 19–32)
Calcium: 9.8 mg/dL (ref 8.4–10.5)
Chloride: 103 mEq/L (ref 96–112)
Creatinine, Ser: 1.6 mg/dL — ABNORMAL HIGH (ref 0.4–1.2)
GFR: 40.22 mL/min — ABNORMAL LOW (ref >60.00)
Glucose, Bld: 95 mg/dL (ref 70–99)
Potassium: 4.2 mEq/L (ref 3.5–5.1)
Sodium: 138 mEq/L (ref 135–145)

## 2013-02-21 LAB — LIPID PANEL
Cholesterol: 234 mg/dL — ABNORMAL HIGH (ref 0–200)
HDL: 37.5 mg/dL — ABNORMAL LOW (ref >39.00)
Total CHOL/HDL Ratio: 6
Triglycerides: 195 mg/dL — ABNORMAL HIGH (ref 0.0–149.0)
VLDL: 39 mg/dL (ref 0.0–40.0)

## 2013-02-21 NOTE — Patient Instructions (Addendum)
Stay on your current medicines  Stop your chocolate - this makes your palpitations worse  Try to check some blood pressures at home  We are checking labs today.   Call your regular doctor and ask about your knee pain/swelling and what you are suppose to be taking  See Dr. Rayann Heman in 6 months in the device clinic  Call the [place] office at [phone] if you have any questions, problems or concerns.

## 2013-02-21 NOTE — Progress Notes (Signed)
[name] Date of Birth: 1946/05/25 Medical Record [id number]  History of Present Illness: [name] is seen back today for a 4 month check. She is seen for Dr. Rayann Heman. She has a hypertrophic CM with diastolic CHF, complete heart block with pacemaker in place, chronic atrial fib and not a candidate for coumadin due to gastric AVMs with past GI bleeding. Other problems include restless leg syndrome, chronic anemia, CKD, HLD, depression, OA, sleep apnea, asthma, colonic polyps, fatty liver, panic attacks, and depression.   She was last seen by Dr. Rayann Heman in [place]. Felt to be doing ok and was without complaint. No longer on statin therapy due to continued complaints of myalgias.   She comes back today. She is here alone. She says she is not sure why she is here. Not having any cardiac issues but has lots of other complaints. Has had more issues with knee swelling and had an Xray. Wants those results. Looks like she was given Colchicine and Prednisone for possible gout. Not much better. Wants to know what she should do. Sometimes has a hot feeling in her legs. Worries that she is diabetic. No longer on her statin. Still with some palpitations - primarily when she eats. Admits to eating lots of chocolate. She has lost weight since she was here. Not checking her BP at home - says she can't read the machine. Ok on her medicines.    Current Outpatient Prescriptions on File Prior to Visit  Medication Sig Dispense Refill  . albuterol (PROVENTIL HFA;VENTOLIN HFA) 108 (90 BASE) MCG/ACT inhaler Inhale 2 puffs into the lungs every 4 (four) hours as needed. For shortness of breath       . ALPRAZolam (XANAX) 0.5 MG tablet Take 1 tablet (0.5 mg total) by mouth daily as needed for anxiety.  15 tablet  5  . aspirin 81 MG tablet Take 81 mg by mouth daily.      . Calcium Carb-Cholecalciferol (CALCIUM-VITAMIN D) 600-400 MG-UNIT TABS Take 1 tablet by mouth 2 (two) times daily.  60 tablet  12  . carvedilol  (COREG) 6.25 MG tablet Take 1.5 tablets AM and 2 tablet PM.      . colchicine 0.6 MG tablet Take 1 tablet (0.6 mg total) by mouth daily.  14 tablet  0  . esomeprazole (NEXIUM) 40 MG capsule Take 1 capsule (40 mg total) by mouth daily before breakfast.  30 capsule  3  . ferrous sulfate 325 (65 FE) MG tablet TAKE 1 TABLET BY MOUTH TWICE DAILY WITH MEALS TO KEEP BLOOD COUNTS UP  60 tablet  3  . furosemide (LASIX) 20 MG tablet Take 1 tablet (20 mg total) by mouth daily.  90 tablet  3  . hydrocortisone (ANUSOL-HC) 25 MG suppository Insert one nightly  12 suppository  2  . Multiple Vitamin (MULITIVITAMIN WITH MINERALS) TABS Take 1 tablet by mouth daily.      . nitroGLYCERIN (NITROSTAT) 0.4 MG SL tablet Place 0.4 mg under the tongue every 5 (five) minutes as needed. For chest pain      . Olopatadine HCl 0.2 % SOLN Apply 1 drop to eye daily as needed. For allergies      . predniSONE (DELTASONE) 20 MG tablet Take 2 tablets (40 mg total) by mouth daily.  8 tablet  0  . sucralfate (CARAFATE) 1 G tablet TAKE 1 TABLET BY MOUTH TWICE DAILY  60 tablet  3   Current Facility-Administered Medications on File Prior to Visit  Medication Dose Route  Frequency Provider Last Rate Last Dose  . cyanocobalamin ((VITAMIN B-12)) injection 1,000 mcg  1,000 mcg Intramuscular Q30 days [name], MD   1,000 mcg at 09/16/11 1341    Allergies  Allergen Reactions  . Ace Inhibitors     REACTION: angioedema    Past Medical History  Diagnosis Date  . Angioedema     2/2 ACE  . RLS (restless legs syndrome)     Dx 06/2007  . Anemia   . Arteriovenous malformation of stomach   . Permanent atrial fibrillation   . Renal failure     baseline creatinine 1.6  . Hyperlipidemia   . Depression   . Diastolic heart failure   . Arthritis   . Sleep apnea   . Asthma   . Hx of colonic polyp 08/13/10    adenomatous  . Fatty liver 07/26/10  . Panic attacks   . Hypertension   . Complete heart block     s/p PPM 1998  .  Hypertrophic cardiomyopathy     dx by Dr [name] 2009  . Hx of colonoscopy   . GI bleed   . Panic attack   . DDD (degenerative disc disease)   . History of alcohol abuse Stopped Fall 2012  . History of tobacco use Quit Fall 2012  . Shortness of breath     sob on exertation  . CHF (congestive heart failure)   . Angina   . GERD (gastroesophageal reflux disease)   . AVM (arteriovenous malformation) of colon     small intestine; stomach  . Right arm pain 01/08/2012  . Hyperlipidemia   . Blood transfusion     Past Surgical History  Procedure Laterality Date  . Tubal ligation  04/01/2000  . Pacemaker insertion  [place], most recent gen change by [name] 4/12  . Cardiac catheterization    . Esophagogastroduodenoscopy  12/23/2011    Procedure: ESOPHAGOGASTRODUODENOSCOPY (EGD);  Surgeon: [name], MD;  Location: [facility];  Service: Endoscopy;  Laterality: N/A;  . Givens capsule study  12/23/2011    Procedure: GIVENS CAPSULE STUDY;  Surgeon: [name], MD;  Location: [facility];  Service: Endoscopy;  Laterality: N/A;    History  Smoking status  . Former Smoker -- 0.10 packs/day for 50 years  . Types: Cigarettes  . Quit date: 08/16/2011  Smokeless tobacco  . Never Used    Comment: quit x 3 months    History  Alcohol Use No    Comment: quit 08/16/2011    Family History  Problem Relation Age of Onset  . Hypertension Mother   . Stroke Mother   . Hypertension Father   . Stroke Father   . Hypertension Sister   . Hypertension Brother   . Colon cancer Brother   . Diabetes Sister   . Diabetes Brother   . Cancer Brother   . Heart attack Brother   . Heart attack Father   . Heart disease Mother   . Heart disease Sister   . Alcohol abuse Brother   . Alcohol abuse Father     Review of Systems: The review of systems is per the HPI.  All other systems were reviewed and are negative.  Physical Exam: Ht 5\' 5"  (1.651 m)  Wt 191 lb 1.9 oz (86.691 kg)  BMI 31.8  kg/m2 Patient is pleasant and in no acute distress. She is obese - weight is down 10 pounds since her last visit. Skin  is warm and dry. Color is normal.  HEENT is unremarkable. Normocephalic/atraumatic. PERRL. Sclera are nonicteric. Neck is supple. No masses. No JVD. Lungs are clear. No wheezing noted. Cardiac exam shows an irregular rhythm. Rate is ok. Abdomen is soft. Extremities are without edema. Gait and ROM are intact. No gross neurologic deficits noted.  LABORATORY DATA: Pending for today.   Lab Results  Component Value Date   WBC 7.0 12/29/2012   HGB 9.2* 12/29/2012   HCT 28.3* 12/29/2012   PLT 262.0 12/29/2012   GLUCOSE 85 10/15/2012   CHOL 149 05/04/2012   TRIG 162* 05/04/2012   HDL 42 05/04/2012   LDLDIRECT 94 09/10/2011   LDLCALC 75 05/04/2012   ALT 15 05/20/2012   AST 25 05/20/2012   NA 139 10/15/2012   K 3.9 10/15/2012   CL 105 10/15/2012   CREATININE 1.81* 10/15/2012   BUN 22 10/15/2012   CO2 26 10/15/2012   TSH 4.212 02/08/2011   INR 0.94 05/03/2012   HGBA1C 5.8* 10/07/2011    Echo Study Conclusions from May 2013  - Left ventricle: LV is severely hypertrophied with mid cavity oblteration during systole. The cavity size was normal. Wall thickness was increased in a pattern of severe LVH. Systolic function was normal. The estimated ejection fraction was in the range of 60% to 65%. - Left atrium: The atrium was moderately dilated. - Pulmonary arteries: PA peak pressure: 73mm Hg (S).    Assessment / Plan: 1. Chronic atrial fib - not a candidate for anticoagulation. Rate seems ok.   2. Diastolic heart failure/HOCM - looks fairly compensated. Weight is actually down.  3. ? Gout - I have asked her to follow up with her PCP  4. HLD - statin intolerant - rechecking labs today.   5. PTVP - to be seen back in the device clinic in May.   6. Palpitations - she has excessive chocolate - this may be aggravating her symptoms. I do not see that we have room to increase her BB due to her  BP and she is not checking it at home.   Patient is agreeable to this plan and will call if any problems develop in the interim.

## 2013-02-22 LAB — LDL CHOLESTEROL, DIRECT: Direct LDL: 161.8 mg/dL

## 2013-02-23 ENCOUNTER — Other Ambulatory Visit: Payer: Self-pay | Admitting: *Deleted

## 2013-02-23 DIAGNOSIS — E785 Hyperlipidemia, unspecified: Secondary | ICD-10-CM

## 2013-03-02 ENCOUNTER — Encounter: Payer: Self-pay | Admitting: Family Medicine

## 2013-03-02 ENCOUNTER — Ambulatory Visit (INDEPENDENT_AMBULATORY_CARE_PROVIDER_SITE_OTHER): Payer: PRIVATE HEALTH INSURANCE | Admitting: Family Medicine

## 2013-03-02 VITALS — BP 92/59 | HR 76 | Temp 98.6°F | Ht 65.0 in | Wt 192.0 lb

## 2013-03-02 DIAGNOSIS — R5381 Other malaise: Secondary | ICD-10-CM

## 2013-03-02 DIAGNOSIS — D649 Anemia, unspecified: Secondary | ICD-10-CM

## 2013-03-02 NOTE — Assessment & Plan Note (Signed)
Form filled for PCS services. She is requesting help to clean house every other week due to fatigue from anemia/AVM, cardiac issues. Form filled and faxed and copy given to patient.

## 2013-03-02 NOTE — Patient Instructions (Addendum)
We filled out your form today  If your knee does not get better, come back and make an appointment to see if we can pull out some of that fluid and inject your knee with steroid to see if we can get it to feel better.   Follow-up in 1 week to discuss your knee and your nerves.

## 2013-03-02 NOTE — Progress Notes (Signed)
  Subjective:    Patient ID: [name], female    DOB: 1946-04-28, 67 y.o.   MRN: [id number]  HPI # Request for PCS, needs form filled Sometimes when she does not feel well or lightheaded, doing house chores are difficult for her. She tires quickly because of her heart problems and has to sit down frequently.  She would like someone to help her clean the house at least every other week.   Review of Systems Denies chest pain, dyspnea at this time  Allergies, medication, past medical history reviewed.  Smoking status noted. History of complete HB s/p pacemaker Sleep apnea Chronic diastolic heart failure Atrial fibrillation  AVM with history of frequent upper GI bleed    Objective:   Physical Exam GEN: NAD     Assessment & Plan:

## 2013-03-03 ENCOUNTER — Encounter: Payer: Self-pay | Admitting: Family Medicine

## 2013-03-03 LAB — IBC PANEL
%SAT: 27 % (ref 20–55)
TIBC: 313 ug/dL (ref 250–470)
UIBC: 228 ug/dL (ref 125–400)

## 2013-03-03 NOTE — Progress Notes (Signed)
Please send copy of iron study results to patient's gastroenterologist Dr. Delfin Edis.

## 2013-03-11 ENCOUNTER — Ambulatory Visit (INDEPENDENT_AMBULATORY_CARE_PROVIDER_SITE_OTHER): Payer: PRIVATE HEALTH INSURANCE | Admitting: Family Medicine

## 2013-03-11 ENCOUNTER — Encounter: Payer: Self-pay | Admitting: Family Medicine

## 2013-03-11 VITALS — BP 113/72 | HR 70 | Temp 98.2°F | Ht 65.0 in | Wt 192.0 lb

## 2013-03-11 DIAGNOSIS — M25561 Pain in right knee: Secondary | ICD-10-CM

## 2013-03-11 DIAGNOSIS — M25569 Pain in unspecified knee: Secondary | ICD-10-CM

## 2013-03-11 DIAGNOSIS — R51 Headache: Secondary | ICD-10-CM

## 2013-03-11 LAB — POCT SEDIMENTATION RATE: POCT SED RATE: 42 mm/hr — AB (ref 0–22)

## 2013-03-11 NOTE — Assessment & Plan Note (Signed)
Improved following prednisone course. Effusion seems resolved. She has a few colchicine left. She may continue prn but advised she make an appointment to be seen if she experiences recurrent gout symptoms; her renal function is still stage 3 CKD but Cr seems to be increasing recent checks.

## 2013-03-11 NOTE — Patient Instructions (Addendum)
If your knee starts to swell and hurt again, you may take the colchicine, but please also make an appointment to see [name]   For your head pains, let's watch it for now. If it becomes more significant, if you have vision changes, weakness on one side, severe headaches, please come back. Follow-up in 1 month.   Low blood pressure: top number is < 90 High blood pressure: top number is >160  If this happens for 2 or more days, please come in and see me or another provider.  Make sure you check your blood pressure when you are relaxed.   GOUT DIET:  Diet guidelines for patients with gout have changed over time, and it is not completely clear which combination of foods is best. You are encouraged to eat and drink: Low-fat dairy products  Foods made with complex carbohydrates (whole grains, brown rice, oats, beans)  Only a moderate amount of wine (up to two 5-ounce servings per day [about 300 mL per day] since this is not likely to increase the risk of a gout attack)  Coffee (may decrease serum uric acid levels)  Vitamin C (500 mg per day has a mild urate-lowering effect) Changes in diet are often recommended along with medications. Diet change alone is unlikely to lower blood urate levels by more than about 15 percent, even if the diet is severely restricted. On the other hand, when diet control is accompanied by weight loss (often with increased exercise) improvements in urate control can be more impressive.

## 2013-03-11 NOTE — Progress Notes (Signed)
  Subjective:    Patient ID: [name], female    DOB: 01-15-1946, 67 y.o.   MRN: [id number]  HPI # Right knee pain, presumed gout The swelling has gone down  Her knee remains sore  She finished prednisone   She also seems to have arthritis in that knee as well and has chronic medial joint line pain. It is mild at this time.  She uses OTC cream for her knee. She has tried various cream in the past, including Aspar cream which helps her.   # Headache when she drinks water or juice. It is a pain on the left temple when she swallows.  Frequency: it happens every day when she drinks for the past 3 months  Her sister and mother had brain aneurysms.   No changes on prednisone course   Review of Systems Denies vision, hearing changes  Denies jaw pain  Allergies, medication, past medical history reviewed.  Smoking status noted.  Stage 3 CKD    Objective:   Physical Exam GEN: NAD HEENT: non-tender at temple without visible lesion; vision grossly intact; could not visualize fundi well NEURO: moves all extremities well; alert and oriented fully; appropriate to all questions KNEE: no swelling, right knee with mild medial joint line tenderness, no erythema/warmth; ROM intact bilaterally    Assessment & Plan:

## 2013-03-11 NOTE — Assessment & Plan Note (Addendum)
Pain on her left temple when she drinks liquids. It does not happen when she eats and resolves after she drinks.  Prednisone course for gout did not affect pain.  D/Dx: idiopathic, brain aneurysm (family history in 2 first degree relatives), giant cell arteritis  -Check ESR, CRP today. Consider ENT referral if abnormal.>>>ESR 54, which is normal based on her age. CRP WNL.  -UTD does not recommend routine screening in general for aneurysms, but she does have a few risk factors (history of tobacco, genetic). However, I would say this is low on differential. I will defer screening at this time.  -Follow-up in 2 weeks or sooner or go to ED if concerning symptoms (see AVS)

## 2013-03-12 LAB — C-REACTIVE PROTEIN: CRP: <0.5 mg/dL (ref <0.60)

## 2013-03-30 ENCOUNTER — Other Ambulatory Visit (INDEPENDENT_AMBULATORY_CARE_PROVIDER_SITE_OTHER): Payer: PRIVATE HEALTH INSURANCE

## 2013-03-30 DIAGNOSIS — E785 Hyperlipidemia, unspecified: Secondary | ICD-10-CM

## 2013-03-30 LAB — HEPATIC FUNCTION PANEL
ALT: 11 U/L (ref 0–35)
AST: 22 U/L (ref 0–37)
Albumin: 4 g/dL (ref 3.5–5.2)
Alkaline Phosphatase: 46 U/L (ref 39–117)
Bilirubin, Direct: 0 mg/dL (ref 0.0–0.3)
Total Bilirubin: 0.5 mg/dL (ref 0.3–1.2)
Total Protein: 7.2 g/dL (ref 6.0–8.3)

## 2013-03-30 LAB — LIPID PANEL
Cholesterol: 150 mg/dL (ref 0–200)
HDL: 32.8 mg/dL — ABNORMAL LOW (ref >39.00)
LDL Cholesterol: 97 mg/dL (ref 0–99)
Total CHOL/HDL Ratio: 5
Triglycerides: 102 mg/dL (ref 0.0–149.0)
VLDL: 20.4 mg/dL (ref 0.0–40.0)

## 2013-04-14 ENCOUNTER — Other Ambulatory Visit: Payer: Self-pay

## 2013-04-14 DIAGNOSIS — Z1231 Encounter for screening mammogram for malignant neoplasm of breast: Secondary | ICD-10-CM

## 2013-04-25 ENCOUNTER — Ambulatory Visit (INDEPENDENT_AMBULATORY_CARE_PROVIDER_SITE_OTHER): Payer: PRIVATE HEALTH INSURANCE | Admitting: *Deleted

## 2013-04-25 DIAGNOSIS — I4891 Unspecified atrial fibrillation: Secondary | ICD-10-CM

## 2013-04-25 DIAGNOSIS — I442 Atrioventricular block, complete: Secondary | ICD-10-CM

## 2013-04-25 NOTE — Progress Notes (Signed)
Pacemaker check in clinic. Normal device function. No changes made. ROV in 6 months w/JA.

## 2013-05-17 ENCOUNTER — Other Ambulatory Visit: Payer: Self-pay | Admitting: Family Medicine

## 2013-05-24 ENCOUNTER — Other Ambulatory Visit: Payer: Self-pay | Admitting: Family Medicine

## 2013-05-24 NOTE — Telephone Encounter (Signed)
Requested Prescriptions   Pending Prescriptions Disp Refills  . ferrous sulfate 325 (65 FE) MG tablet [Pharmacy Med Name: FERROUS SULFATE 325MG  (5GR) TABS] 60 tablet 0    Sig: TAKE 1 TABLET BY MOUTH TWICE DAILY WITH MEALS TO KEEP BLOOD COUNTS UP   Tildon Husky, RN-BSN

## 2013-05-25 ENCOUNTER — Ambulatory Visit (INDEPENDENT_AMBULATORY_CARE_PROVIDER_SITE_OTHER): Payer: PRIVATE HEALTH INSURANCE | Admitting: Family Medicine

## 2013-05-25 VITALS — BP 101/68 | HR 83 | Temp 98.2°F | Ht 65.0 in | Wt 190.0 lb

## 2013-05-25 DIAGNOSIS — F411 Generalized anxiety disorder: Secondary | ICD-10-CM

## 2013-05-25 DIAGNOSIS — R0989 Other specified symptoms and signs involving the circulatory and respiratory systems: Secondary | ICD-10-CM

## 2013-05-25 DIAGNOSIS — R0600 Dyspnea, unspecified: Secondary | ICD-10-CM

## 2013-05-25 DIAGNOSIS — R5381 Other malaise: Secondary | ICD-10-CM

## 2013-05-25 DIAGNOSIS — Z0000 Encounter for general adult medical examination without abnormal findings: Secondary | ICD-10-CM

## 2013-05-25 DIAGNOSIS — R5383 Other fatigue: Secondary | ICD-10-CM

## 2013-05-25 DIAGNOSIS — L299 Pruritus, unspecified: Secondary | ICD-10-CM

## 2013-05-25 LAB — CBC
HCT: 27.4 % — ABNORMAL LOW (ref 36.0–46.0)
MCH: 27.7 pg (ref 26.0–34.0)
MCV: 86.2 fL (ref 78.0–100.0)
RBC: 3.18 MIL/uL — ABNORMAL LOW (ref 3.87–5.11)
WBC: 4.4 10*3/uL (ref 4.0–10.5)

## 2013-05-25 LAB — IBC PANEL: %SAT: 19 % — ABNORMAL LOW (ref 20–55)

## 2013-05-25 LAB — IRON: Iron: 64 ug/dL (ref 42–145)

## 2013-05-25 MED ORDER — HYDROCORTISONE 0.5 % EX CREA: TOPICAL_CREAM | Freq: Two times a day (BID) | CUTANEOUS | Status: DC

## 2013-05-25 NOTE — Assessment & Plan Note (Signed)
Of back without lesions.  Discussed unlikely shingles without pain but to call if pain and/or rash.

## 2013-05-25 NOTE — Assessment & Plan Note (Signed)
She is up to date on vaccinations except shingles which she cannot afford

## 2013-05-25 NOTE — Progress Notes (Signed)
  Subjective:    Patient ID: [name], female    DOB: July 16, 1946, 67 y.o.   MRN: [id number]  HPI # Preventative  She cannot afford shingles vaccination  # Back itchiness She had shingles in the past and is wondering if this is it  # She has been taking ore Xanax recently because she has been under more stress. Now taking usually 1 a day.   # She has been feeling more tired and short of breath when climbing up steps and walking outside She would like her iron checked  ROS: denies chest pain   Review of Systems Per HPI Denies blood in stool, decreased appetite  Allergies, medication, past medical history reviewed.  Smoking status noted.  History of complete HB s/p pacemaker  # Sleep apnea  # Chronic diastolic heart failure.    ECHO 04/2013: Left ventricle: LV is severely hypertrophied with mid-cavity obliteration during systole. The cavity size was normal. Wall thickness was increased in a pattern of severe LVH. Systolic function was normal. The estimated ejection fraction was in the range of 60% to 65%. # Atrial fibrillation  # AVM with history of frequent upper GI bleed # Anxiety--she has recently increased her dose of Ativan because of stress associated with taking care of her brother with health problems    Objective:   Physical Exam GEN: NAD CV: RRR, no m/r/g PULM: NI WOB; CTAB without w/r/r EXT: no edema BACK: no lesions on back including erythema PSYCH: pleasant; not anxious or depressed appearing; alert and oriented    Assessment & Plan:

## 2013-05-25 NOTE — Patient Instructions (Addendum)
Please make an appointment in July or August to talk about your anxiety with your new doctor  I will call with your lab results

## 2013-05-25 NOTE — Assessment & Plan Note (Signed)
D/Dx: anemia, heart failure exacerbation (but no weight gain, actually weight loss), depression/anxiety -Check CBC, iron panel, pro BNP -Will call her with results to discuss management

## 2013-05-25 NOTE — Assessment & Plan Note (Signed)
It is worsening with new stressors. She may continue Ativan which she takes qd at this time but she will need follow-up to discuss within next month (before Rx runs out). Also see A/P for fatigue.

## 2013-05-26 LAB — PRO B NATRIURETIC PEPTIDE: Pro B Natriuretic peptide (BNP): 1696 pg/mL — ABNORMAL HIGH (ref <126)

## 2013-05-28 ENCOUNTER — Telehealth: Payer: Self-pay | Admitting: Family Medicine

## 2013-05-28 NOTE — Telephone Encounter (Signed)
Called patient and notified of labs.  Hgb is low 8.8, ferritin is low side of normal with low % saturations.  I will ask Dr. [name] to see if she has any recommendations. Patient has gotten IV iron in the past. She is on oral iron bid currently.   Patient was advised to call me in a week if she does not hear from me or Dr. [name].  Elevated proBNP, she is has history of elevated proBNP.  Will hold increasing Lasix at this time due to no signs fluid overload PE.  She was advised to follow-up if leg swelling, dyspnea, weight gain.

## 2013-05-30 ENCOUNTER — Ambulatory Visit
Admission: RE | Admit: 2013-05-30 | Discharge: 2013-05-30 | Disposition: A | Discharge status: Home / Self Care | Payer: PRIVATE HEALTH INSURANCE | Source: Ambulatory Visit

## 2013-05-30 ENCOUNTER — Telehealth: Payer: Self-pay | Admitting: *Deleted

## 2013-05-30 DIAGNOSIS — Z1231 Encounter for screening mammogram for malignant neoplasm of breast: Secondary | ICD-10-CM

## 2013-05-30 DIAGNOSIS — D509 Iron deficiency anemia, unspecified: Secondary | ICD-10-CM

## 2013-05-30 NOTE — Telephone Encounter (Signed)
Message    Hello Levada Dy, Hgb is slightly lower than in January when she had an infusion of Ferraheme.. I don't see where she had a blood count checked in March or April. She is on oral Iron, so her Iron levels are not reflecting her iron deficiency. With Your permission, we are going to schedule her for Ferraheme infusion at [facility], premedicate with Solumedrol 20mg  IV and give Benadryl 25 mg Iv prior to the infusion ( she had a rxn to Imfed). We will book her for repeat H/H in 4 weeks following Iron infusion. She needs regular H/H checks q 4-6 weeks. Thanks for letting me know! Delfin Edis   ----- Message -----   From: Carolin Guernsey, MD   Sent: 05/30/2013 3:41 PM   To: [name], MD      Dr. [name],   Ms. Bradwell-Polk is one of my primary care patients. She was endorsing fatigue so we checked her Hgb and ferritin. Hgb is low 8.8 (her baseline is high 8-9s), ferritin is low side of normal with low % saturations. Is there anything you would recommend at this time? Would you like me to ask her to schedule an appointment to see you? Her last OV with you was in 09/2012.       -[place]

## 2013-05-31 ENCOUNTER — Other Ambulatory Visit: Payer: Self-pay | Admitting: *Deleted

## 2013-05-31 DIAGNOSIS — D509 Iron deficiency anemia, unspecified: Secondary | ICD-10-CM

## 2013-05-31 NOTE — Telephone Encounter (Signed)
Orders in EPIC for Feraheme and labs on 07/11/13. Patient given date and time of infusion.

## 2013-05-31 NOTE — Telephone Encounter (Signed)
Spoke with patient and she understands she needs IV Feraheme. Spoke with Karena Addison at [facility] short stay and scheduled patient on 05/3013 10:00 AM.

## 2013-06-01 NOTE — Telephone Encounter (Signed)
Dr. [name] notified. She will schedule IV iron treatment and follow-up with her.

## 2013-06-09 ENCOUNTER — Encounter: Payer: Self-pay | Admitting: Internal Medicine

## 2013-06-13 ENCOUNTER — Encounter (HOSPITAL_COMMUNITY): Payer: Self-pay

## 2013-06-13 ENCOUNTER — Encounter (HOSPITAL_COMMUNITY)
Admission: RE | Admit: 2013-06-13 | Discharge: 2013-06-13 | Disposition: A | Discharge status: Home / Self Care | Payer: PRIVATE HEALTH INSURANCE | Source: Ambulatory Visit | Attending: Internal Medicine | Admitting: Internal Medicine

## 2013-06-13 VITALS — BP 135/94 | HR 65 | Temp 97.9°F | Resp 20

## 2013-06-13 DIAGNOSIS — D509 Iron deficiency anemia, unspecified: Secondary | ICD-10-CM | POA: Insufficient documentation

## 2013-06-13 MED ORDER — DIPHENHYDRAMINE HCL 50 MG/ML IJ SOLN
25.0000 mg | Freq: Once | INTRAMUSCULAR | Status: AC
  Administered 2013-06-13: 25 mg via INTRAVENOUS
  Filled 2013-06-13: qty 1

## 2013-06-13 MED ORDER — SODIUM CHLORIDE 0.9 % IV SOLN
INTRAVENOUS | Status: AC
  Administered 2013-06-13: 11:00:00 via INTRAVENOUS

## 2013-06-13 MED ORDER — SODIUM CHLORIDE 0.9 % IV SOLN
1020.0000 mg | Freq: Once | INTRAVENOUS | Status: AC
  Administered 2013-06-13: 1020 mg via INTRAVENOUS
  Filled 2013-06-13: qty 34

## 2013-06-13 MED ORDER — METHYLPREDNISOLONE SODIUM SUCC 40 MG IJ SOLR
20.0000 mg | Freq: Once | INTRAMUSCULAR | Status: AC
  Administered 2013-06-13: 20 mg via INTRAVENOUS
  Filled 2013-06-13: qty 1

## 2013-06-13 NOTE — Progress Notes (Signed)
Note sent to Dr [name] and Dr Hampton Abbot about patient being on oral iron supplements as well as getting IV iron. Asked MDs to notify patient if she is to discontinue oral iron supplements.

## 2013-06-24 ENCOUNTER — Encounter: Payer: Self-pay | Admitting: Family Medicine

## 2013-06-24 ENCOUNTER — Ambulatory Visit (INDEPENDENT_AMBULATORY_CARE_PROVIDER_SITE_OTHER): Payer: PRIVATE HEALTH INSURANCE | Admitting: Family Medicine

## 2013-06-24 VITALS — BP 108/73 | HR 85 | Temp 98.1°F | Ht 65.0 in | Wt 189.4 lb

## 2013-06-24 DIAGNOSIS — M25579 Pain in unspecified ankle and joints of unspecified foot: Secondary | ICD-10-CM

## 2013-06-24 DIAGNOSIS — F410 Panic disorder [episodic paroxysmal anxiety] without agoraphobia: Secondary | ICD-10-CM

## 2013-06-24 DIAGNOSIS — M25572 Pain in left ankle and joints of left foot: Secondary | ICD-10-CM

## 2013-06-24 MED ORDER — ALPRAZOLAM 0.5 MG PO TABS: 0.5000 mg | ORAL_TABLET | Freq: Every day | ORAL | Status: DC | PRN

## 2013-06-24 MED ORDER — DICLOFENAC SODIUM 1 % TD GEL: 2.0000 g | Freq: Four times a day (QID) | TRANSDERMAL | Status: DC

## 2013-06-24 NOTE — Progress Notes (Addendum)
[name] Garret Reddish, MD Phone: [phone]  Subjective:   # Left Foot Pain Started 2 weeks ago. Pain in top of foot. Couldn't walk on area last time this happened but able to walk on it this time (last time was when she was hospitalized and appears that it was believed she had gout of the ankle). Started swelling and painful and had to hold something to be able to walk around 2 weeks ago. Has been using alcohol pads and Triderma pain relief cream. Has also tried vicks. Has also tried. Tried colchicine x 2 doses on Monday and Wednesday, helped minimally. 5/10 pain. Able to walk on it. No falls recently. Denies trauma. Also feels pain in the left hip from time to time. Some low back pain. Seems to intermittently have these hip and back pain for years. Achy pain. No fevers/chills or redness on foot. No nausea/vomiting. Pain overall improving. She interested in voltaren gel as this has worked for sister aches/pains before.   ROS--See HPI, with additions has felt some aching throughout left side of body including left hip, left leg, left arm, and left chest for 1-2 weeks. Pain in left chest is nonexertional and not relieved by rest. Does not feel like previous MI or previous chest pain. Does not remember injury. May have listed something heavy about 1.5 weeks ago when it started.  Described as left sided soreness that is worse when she changes positions but will have a slight ache at other times. No shortness of breath, diaphoresis, dyspnea on exertion, palpitations. She has not tried nitro because it does not feel like chest pain that is typical for her when she used to use that.   Past Medical History-Stage III CKD, Pacemaker due to history complete heart block, patient reports history of MI but do not see in problem list, history anxiety, history a fib, hypertension, hyperlipidemia.  Reviewed problem list.  Medications- reviewed and updated Chief  complaint-noted  Objective: BP 108/73  Pulse 85  Temp(Src) 98.1 F (36.7 C) (Oral)  Ht 5\' 5"  (1.651 m)  Wt 189 lb 6.4 oz (85.911 kg)  BMI 31.52 kg/m2 Gen: NAD, resting comfortably on table CV: RRR no murmurs rubs or gallops Chest wall: left side of chest with tenderness to palpation several inches to left of sternum.  Lungs: CTAB no crackles, wheeze, rhonchi Skin: warm, dry Ext: no edema, left foot with mild tenderness over dorsum of foot over navicular, no pain over medial or lateral malleous. No warmth or erythema. Normal gait. 5/5 strength in bilateral lower extremities. No signs of brusing. No pain over head of 5th metatarsal or over head of navicular.  Neuro: grossly normal, moves all extremities  Assessment/Plan:  # Left Foot Pain Unclear etiology. I doubt this is gout without any associated inflammation, redness, or warmth of joint. Pain over navicular. WIthout history of trauma or turning ankle, [place] ankle rules not very aplicable though pain over navicular would make me consider imeging if pain does not continue to improve. Unclear etiology. Possible DJD with history arthritis. Patient was hoping to try voltaren gel and I think this is resonable based off of history of CKD stage III and avoidance of systemic nsaids advised. Patient to follow up within the month at Dr. Marissa Calamity next available. Can follow up with me sooner if pain fails to continue to improve.   In addition, I did give #15 xanax to patient to allow her to get to next appointment to have discussions  with Dr. Ardeen Fillers about longterm planning for management of her panic attacks.   Also, discussed red flags that patient can present to ED for chest pain if needed. Advised ice to left side of chest for likely MSK chest pain.

## 2013-06-24 NOTE — Patient Instructions (Addendum)
I am not sure exactly what is causing your foot pain but I do not think you have a fracture or gout. I am going to try the voltaren gel you were talking about to see if that helps. If you have increasing pain, redness in the area, fevers, chills, please come see [place] or call our after hours line for advice over weekend.   Regarding your chest soreness, I think you have strained a muscle in your chest. If you develop sweating, pain with walking or exerting yourself, shortness of breath, start sleeping on more pillows, or gain weight rapidly please go immediately to the hospital.   Schedule the next available appointment with Dr. Lamar Benes to discuss your anxiety attack history and to follow up on your regular healthcare needs,  Dr. Yong Channel  Health Maintenance Due  Topic Date Due  . Zostavax  01/11/2004

## 2013-06-25 ENCOUNTER — Encounter (HOSPITAL_COMMUNITY): Payer: Self-pay | Admitting: *Deleted

## 2013-06-25 ENCOUNTER — Other Ambulatory Visit: Payer: Self-pay

## 2013-06-25 ENCOUNTER — Emergency Department (HOSPITAL_COMMUNITY)
Admission: EM | Admit: 2013-06-25 | Discharge: 2013-06-26 | Disposition: A | Discharge status: Home / Self Care | Payer: PRIVATE HEALTH INSURANCE | Attending: Emergency Medicine | Admitting: Emergency Medicine

## 2013-06-25 DIAGNOSIS — R6889 Other general symptoms and signs: Secondary | ICD-10-CM | POA: Insufficient documentation

## 2013-06-25 DIAGNOSIS — Z8669 Personal history of other diseases of the nervous system and sense organs: Secondary | ICD-10-CM | POA: Insufficient documentation

## 2013-06-25 DIAGNOSIS — Z7982 Long term (current) use of aspirin: Secondary | ICD-10-CM | POA: Insufficient documentation

## 2013-06-25 DIAGNOSIS — M79609 Pain in unspecified limb: Secondary | ICD-10-CM | POA: Insufficient documentation

## 2013-06-25 DIAGNOSIS — Z860100 Personal history of colon polyps, unspecified: Secondary | ICD-10-CM | POA: Insufficient documentation

## 2013-06-25 DIAGNOSIS — Z8739 Personal history of other diseases of the musculoskeletal system and connective tissue: Secondary | ICD-10-CM | POA: Insufficient documentation

## 2013-06-25 DIAGNOSIS — F410 Panic disorder [episodic paroxysmal anxiety] without agoraphobia: Secondary | ICD-10-CM | POA: Insufficient documentation

## 2013-06-25 DIAGNOSIS — M79672 Pain in left foot: Secondary | ICD-10-CM

## 2013-06-25 DIAGNOSIS — Z950 Presence of cardiac pacemaker: Secondary | ICD-10-CM | POA: Insufficient documentation

## 2013-06-25 DIAGNOSIS — Z79899 Other long term (current) drug therapy: Secondary | ICD-10-CM | POA: Insufficient documentation

## 2013-06-25 DIAGNOSIS — K219 Gastro-esophageal reflux disease without esophagitis: Secondary | ICD-10-CM | POA: Insufficient documentation

## 2013-06-25 DIAGNOSIS — D649 Anemia, unspecified: Secondary | ICD-10-CM | POA: Insufficient documentation

## 2013-06-25 DIAGNOSIS — F329 Major depressive disorder, single episode, unspecified: Secondary | ICD-10-CM | POA: Insufficient documentation

## 2013-06-25 DIAGNOSIS — M129 Arthropathy, unspecified: Secondary | ICD-10-CM | POA: Insufficient documentation

## 2013-06-25 DIAGNOSIS — Z8679 Personal history of other diseases of the circulatory system: Secondary | ICD-10-CM | POA: Insufficient documentation

## 2013-06-25 DIAGNOSIS — M76899 Other specified enthesopathies of unspecified lower limb, excluding foot: Secondary | ICD-10-CM | POA: Insufficient documentation

## 2013-06-25 DIAGNOSIS — R059 Cough, unspecified: Secondary | ICD-10-CM | POA: Insufficient documentation

## 2013-06-25 DIAGNOSIS — Z9861 Coronary angioplasty status: Secondary | ICD-10-CM | POA: Insufficient documentation

## 2013-06-25 DIAGNOSIS — M7062 Trochanteric bursitis, left hip: Secondary | ICD-10-CM

## 2013-06-25 DIAGNOSIS — R071 Chest pain on breathing: Secondary | ICD-10-CM | POA: Insufficient documentation

## 2013-06-25 DIAGNOSIS — R0602 Shortness of breath: Secondary | ICD-10-CM

## 2013-06-25 DIAGNOSIS — Z87448 Personal history of other diseases of urinary system: Secondary | ICD-10-CM | POA: Insufficient documentation

## 2013-06-25 DIAGNOSIS — Z87738 Personal history of other specified (corrected) congenital malformations of digestive system: Secondary | ICD-10-CM | POA: Insufficient documentation

## 2013-06-25 DIAGNOSIS — R05 Cough: Secondary | ICD-10-CM | POA: Insufficient documentation

## 2013-06-25 DIAGNOSIS — E785 Hyperlipidemia, unspecified: Secondary | ICD-10-CM | POA: Insufficient documentation

## 2013-06-25 DIAGNOSIS — Z8719 Personal history of other diseases of the digestive system: Secondary | ICD-10-CM | POA: Insufficient documentation

## 2013-06-25 DIAGNOSIS — F3289 Other specified depressive episodes: Secondary | ICD-10-CM | POA: Insufficient documentation

## 2013-06-25 DIAGNOSIS — I4891 Unspecified atrial fibrillation: Secondary | ICD-10-CM | POA: Insufficient documentation

## 2013-06-25 DIAGNOSIS — J45901 Unspecified asthma with (acute) exacerbation: Secondary | ICD-10-CM | POA: Insufficient documentation

## 2013-06-25 DIAGNOSIS — Z87891 Personal history of nicotine dependence: Secondary | ICD-10-CM | POA: Insufficient documentation

## 2013-06-25 DIAGNOSIS — I509 Heart failure, unspecified: Secondary | ICD-10-CM | POA: Insufficient documentation

## 2013-06-25 DIAGNOSIS — R0789 Other chest pain: Secondary | ICD-10-CM

## 2013-06-25 LAB — POCT I-STAT TROPONIN I

## 2013-06-25 LAB — CBC
HCT: 28.3 % — ABNORMAL LOW (ref 36.0–46.0)
Hemoglobin: 9.3 g/dL — ABNORMAL LOW (ref 12.0–15.0)
MCHC: 32.9 g/dL (ref 30.0–36.0)
MCV: 88.2 fL (ref 78.0–100.0)

## 2013-06-25 LAB — BASIC METABOLIC PANEL
BUN: 32 mg/dL — ABNORMAL HIGH (ref 6–23)
Chloride: 100 mEq/L (ref 96–112)
Creatinine, Ser: 1.64 mg/dL — ABNORMAL HIGH (ref 0.50–1.10)
GFR calc non Af Amer: 31 mL/min — ABNORMAL LOW (ref >90)
Glucose, Bld: 141 mg/dL — ABNORMAL HIGH (ref 70–99)
Potassium: 4.1 mEq/L (ref 3.5–5.1)

## 2013-06-25 MED ORDER — FUROSEMIDE 10 MG/ML IJ SOLN
40.0000 mg | Freq: Once | INTRAMUSCULAR | Status: AC
  Administered 2013-06-26: 40 mg via INTRAVENOUS
  Filled 2013-06-25: qty 4

## 2013-06-25 NOTE — ED Provider Notes (Signed)
History    CSN: [id number] Arrival date & time 06/25/13  2157  First MD Initiated Contact with Patient 06/25/13 2305     Chief Complaint  Patient presents with  . Chest Pain    HPI [name] is a 67 y.o. female: patient presents with multiple complaints. Patient is had chest soreness for one or 2 weeks, she saw her family doctor for this yesterday. She says it is not chest pain, it is a soreness, the left side of the chest, worse with arm movement and palpation, she has had some associated shortness of breath over the course of the last day and has had a 2 pound weight gain.  She says his chest it comes and goes, this also had a little but more of a cough, it hurts her chest, she's had some sneezing which also hurts her chest as well. Pain in the left chest is nonexertional, not relieved by rest. She says it does not feel like previous heart attack. She is unaware of any injury or overuse activity. She does describe lifting something heavy 1.5 weeks ago which was about when it started. Patient's most concerned that she gained 2 pounds in the last day or so has had a slight increase in shortness of breath. She denies any fevers.  She's had some intermittent nausea no temporal relationship to shortness of breath or chest pain.  Patient has no history of venous thromboembolic disease, no hemoptysis, no recent immobilization, cancer treatments, traumatic injury.  She's also complaining about left foot pain which is been treated for by her primary care physician for gout with colchicine. Patient's also complaining about left hip pain, this is directly over the left hip, dull and worse on ambulation. She's had no vomiting, she claims she quit smoking a year and a half ago.  Past Medical History  Diagnosis Date  . Angioedema     2/2 ACE  . RLS (restless legs syndrome)     Dx 06/2007  . Anemia   . Arteriovenous malformation of stomach   . Permanent atrial fibrillation   . Renal  failure     baseline creatinine 1.6  . Hyperlipidemia   . Depression   . Diastolic heart failure   . Arthritis   . Sleep apnea   . Asthma   . Hx of colonic polyp 08/13/10    adenomatous  . Fatty liver 07/26/10  . Panic attacks   . Hypertension   . Complete heart block     s/p PPM 1998  . Hypertrophic cardiomyopathy     dx by Dr [name] 2009  . Hx of colonoscopy   . GI bleed   . Panic attack   . DDD (degenerative disc disease)   . History of alcohol abuse Stopped Fall 2012  . History of tobacco use Quit Fall 2012  . Shortness of breath     sob on exertation  . CHF (congestive heart failure)   . Angina   . GERD (gastroesophageal reflux disease)   . AVM (arteriovenous malformation) of colon     small intestine; stomach  . Right arm pain 01/08/2012  . Hyperlipidemia   . Blood transfusion    Past Surgical History  Procedure Laterality Date  . Tubal ligation  04/01/2000  . Pacemaker insertion  [place], most recent gen change by [name] 4/12  . Cardiac catheterization    . Esophagogastroduodenoscopy  12/23/2011    Procedure: ESOPHAGOGASTRODUODENOSCOPY (EGD);  Surgeon: Sydell Axon  Andris Baumann, MD;  Location: [facility];  Service: Endoscopy;  Laterality: N/A;  . Givens capsule study  12/23/2011    Procedure: GIVENS CAPSULE STUDY;  Surgeon: [name], MD;  Location: [facility];  Service: Endoscopy;  Laterality: N/A;   Family History  Problem Relation Age of Onset  . Hypertension Mother   . Stroke Mother   . Hypertension Father   . Stroke Father   . Hypertension Sister   . Hypertension Brother   . Colon cancer Brother   . Diabetes Sister   . Diabetes Brother   . Cancer Brother   . Heart attack Brother   . Heart attack Father   . Heart disease Mother   . Heart disease Sister   . Alcohol abuse Brother   . Alcohol abuse Father    History  Substance Use Topics  . Smoking status: Former Smoker -- 0.10 packs/day for 50 years    Types: Cigarettes    Quit date: 08/16/2011   . Smokeless tobacco: Never Used     Comment: quit x 3 months  . Alcohol Use: No     Comment: quit 08/16/2011   OB History   Grav Para Term Preterm Abortions TAB SAB Ect Mult Living                 Review of Systems At least 10pt or greater review of systems completed and are negative except where specified in the HPI.  Allergies  Ace inhibitors  Home Medications   Current Outpatient Rx  Name  Route  Sig  Dispense  Refill  . albuterol (PROVENTIL HFA;VENTOLIN HFA) 108 (90 BASE) MCG/ACT inhaler   Inhalation   Inhale 2 puffs into the lungs every 4 (four) hours as needed. For shortness of breath          . ALPRAZolam (XANAX) 0.5 MG tablet   Oral   Take 1 tablet (0.5 mg total) by mouth daily as needed for anxiety (need appointment with Dr. Lamar Benes before more refills.).   15 tablet   0   . aspirin 81 MG tablet   Oral   Take 81 mg by mouth daily.         . Calcium Carb-Cholecalciferol (CALCIUM-VITAMIN D) 600-400 MG-UNIT TABS   Oral   Take 1 tablet by mouth 2 (two) times daily.   60 tablet   12   . carvedilol (COREG) 6.25 MG tablet      Take 1.5 tablets AM and 2 tablet PM.         . ferrous sulfate 325 (65 FE) MG tablet      TAKE 1 TABLET BY MOUTH TWICE DAILY WITH MEALS TO KEEP BLOOD COUNTS UP   60 tablet   3   . furosemide (LASIX) 20 MG tablet   Oral   Take 1 tablet (20 mg total) by mouth daily.   90 tablet   3   . hydrocortisone (ANUSOL-HC) 25 MG suppository   Rectal   Place 25 mg rectally as needed. Insert one nightly         . hydrocortisone cream 0.5 %   Topical   Apply topically 2 (two) times daily. For itchiness on back   30 g   0   . IRON DEXTRAN IJ   Injection   Inject 1 Applicatorful as directed once.         . Multiple Vitamin (MULITIVITAMIN WITH MINERALS) TABS   Oral   Take 1 tablet by mouth  daily.         [name] NEXIUM 40 MG capsule      TAKE 1 CAPSULE BY MOUTH EVERY MORNING BEFORE BREAKFAST   30 capsule   3   . polyvinyl  alcohol (LIQUIFILM TEARS) 1.4 % ophthalmic solution   Both Eyes   Place 1 drop into both eyes 3 (three) times daily as needed (for dry itching eye).         . rosuvastatin (CRESTOR) 20 MG tablet   Oral   Take 20 mg by mouth daily. Take 1 tablet Monday, Wednesday, and Friday.         . sucralfate (CARAFATE) 1 G tablet      TAKE 1 TABLET BY MOUTH TWICE DAILY   60 tablet   3   . colchicine 0.6 MG tablet   Oral   Take 1 tablet (0.6 mg total) by mouth daily.   14 tablet   0   . diclofenac sodium (VOLTAREN) 1 % GEL   Topical   Apply 2 g topically 4 (four) times daily.   100 g   1   . nitroGLYCERIN (NITROSTAT) 0.4 MG SL tablet   Sublingual   Place 0.4 mg under the tongue every 5 (five) minutes as needed. For chest pain         . Olopatadine HCl 0.2 % SOLN   Ophthalmic   Apply 1 drop to eye daily as needed. For allergies          BP 116/72  Pulse 70  Temp(Src) 98.5 F (36.9 C) (Oral)  Resp 22  SpO2 100% Physical Exam  Nursing notes reviewed.  Electronic medical record reviewed. VITAL SIGNS:   Filed Vitals:   06/26/13 0530 06/26/13 0545 06/26/13 0613 06/26/13 0717  BP: 105/68 104/58  116/74  Pulse: 69 70 70   Temp:    97.8 F (36.6 C)  TempSrc:    Oral  Resp:    18  SpO2: 99% 96% 94% 96%   CONSTITUTIONAL: Awake, oriented, appears non-toxic HENT: Atraumatic, normocephalic, oral mucosa pink and moist, airway patent. Nares patent without drainage. External ears normal. EYES: Conjunctiva clear, EOMI, PERRLA NECK: Trachea midline, non-tender, supple CARDIOVASCULAR: Normal heart rate, Normal rhythm, No murmurs, rubs, gallops PULMONARY/CHEST: Scattered fine crackles in the bases otherwise clear. Symmetrical breath sounds. Chest pain is referred by palpating over the left chest, as well as contraction of the left pectoralis major muscle. ABDOMINAL: Non-distended, soft, non-tender - no rebound or guarding.  BS normal. NEUROLOGIC: Non-focal, moving all four  extremities, no gross sensory or motor deficits. EXTREMITIES: No clubbing, cyanosis, or edema. Tenderness over greater trochanter when palpated. Tenderness over mid left foot, no erythema, mild swelling. Trace edema in the lower extremities. SKIN: Warm, Dry, No erythema, No rash  ED Course  Procedures (including critical care time)  Labs Reviewed  CBC - Abnormal; Notable for the following:    RBC 3.21 (*)    Hemoglobin 9.3 (*)    HCT 28.3 (*)    All other components within normal limits  BASIC METABOLIC PANEL - Abnormal; Notable for the following:    Glucose, Bld 141 (*)    BUN 32 (*)    Creatinine, Ser 1.64 (*)    GFR calc non Af Amer 31 (*)    GFR calc Af Amer 36 (*)    All other components within normal limits  PRO B NATRIURETIC PEPTIDE - Abnormal; Notable for the following:    Pro B Natriuretic peptide (  BNP) 1498.0 (*)    All other components within normal limits  POCT I-STAT TROPONIN I - Abnormal; Notable for the following:    Troponin i, poc 0.14 (*)    All other components within normal limits  POCT I-STAT TROPONIN I - Abnormal; Notable for the following:    Troponin i, poc 0.15 (*)    All other components within normal limits  POCT I-STAT TROPONIN I - Abnormal; Notable for the following:    Troponin i, poc 0.15 (*)    All other components within normal limits  POCT I-STAT TROPONIN I - Abnormal; Notable for the following:    Troponin i, poc 0.13 (*)    All other components within normal limits   Dg Chest 2 View  06/26/2013   *RADIOLOGY REPORT*  Clinical Data: Sore chest for 2 weeks.  Increase shortness of breath today.  CHEST - 2 VIEW  Comparison: 08/27/2012  Findings: Stable appearance of cardiac pacemaker.  Shallow inspiration.  Cardiac enlargement with mild pulmonary vascular congestion.  No evidence of edema or consolidation.  No blunting of costophrenic angles.  No pneumothorax.  A calcified and tortuous aorta.  IMPRESSION: Cardiac enlargement with mild pulmonary  vascular congestion.  No edema or consolidation.   Original Report Authenticated By: Lucienne Capers, M.D.   1. Shortness of breath   2. CHF exacerbation   3. Chest wall pain   4. Trochanteric bursitis of left hip   5. Left foot pain     MDM  Patient has a paced rhythm.  Presenting with soreness to the left chest, this is reproducible on palpation, she see her primary care physician for this, do not think this is cardiac in nature. Patient does not appear fluid overloaded, mild trace edema in the lower extremities. Patient has a Wells score of 0 for pulmonary embolism-I do not think she's got a PE either. I think if anything, this patient has a mild anxiety which has recently been treated with Xanax by her primary care physician. Given the patient some Lasix in the emergency department observe her overnight, patient's shortness of breath improved with diuresis. Patient's BNP was not particularly impressive at 1498, it has been more elevated in the past.  Chest x-ray was fairly unremarkable, showed chronic cardiac enlargement with mild pulmonary vascular congestion without edema or consolidation. Do not think this patient has pneumonia and will require antibiotics at this time. Chemistries redemonstrates renal insufficiency, patient does have some baseline elevated troponins from 0.132 0.15, she's had 4 troponins in the same range, no elevations.  Pt also has chronic anemia.  Pt to follow up with PCP for chronic complaints, do not think she has acute cardiopulmonary disease at this time, she does have some chronic illnesses she is also clearly anxious.  Recommended Tylenol for her left-sided chest wall pain-this is reproducible, again I don't think his cardiac and likely the result of over exertion, also reproducible on flexion of the left pectoralis muscle.   Think this patient has been adequately screened for emergent medical condition, and observe her overnight in the emergency department, ambulating  to the restroom without oxygen saturations, patient feels comfortable with the medical plan continue her home medications and following up with primary care, questions answered to her satisfaction, and she will be discharged home stable and in good condition to return to the emergency department for any concerning symptoms.  Rhunette Croft, MD 06/26/13 [phone]

## 2013-06-25 NOTE — ED Notes (Signed)
The pt has had some chest soreness for 1-2 weeks.  She saw her doctor yesterday for the same.  She has had a weight gain she just noticed this am

## 2013-06-25 NOTE — ED Notes (Signed)
Lab called and reported elevated troponin.   Pt reassessed. And charge nurse notified

## 2013-06-26 ENCOUNTER — Emergency Department (HOSPITAL_COMMUNITY): Payer: PRIVATE HEALTH INSURANCE

## 2013-06-26 LAB — POCT I-STAT TROPONIN I: Troponin i, poc: 0.15 ng/mL (ref 0.00–0.08)

## 2013-06-26 MED ORDER — ACETAMINOPHEN 325 MG PO TABS
650.0000 mg | ORAL_TABLET | Freq: Once | ORAL | Status: AC
  Administered 2013-06-26: 650 mg via ORAL
  Filled 2013-06-26: qty 2

## 2013-06-26 NOTE — ED Notes (Signed)
No new changes patient resting in bed

## 2013-06-26 NOTE — ED Notes (Signed)
For midnight on 06-26-13

## 2013-06-26 NOTE — ED Notes (Signed)
Adjusted pt bed and pt given a warm blanket.

## 2013-06-26 NOTE — ED Notes (Signed)
O2 90-92% when ambulating on RA.

## 2013-06-26 NOTE — ED Notes (Signed)
Called pts son to pick her up

## 2013-06-27 ENCOUNTER — Telehealth: Payer: Self-pay | Admitting: Internal Medicine

## 2013-06-27 NOTE — Telephone Encounter (Signed)
New Prob      Pt states she has been experiencing some soreness in her chest and would like to speak to the nurse regarding this. Pt just recently discharged from the hospital. Please call.

## 2013-06-27 NOTE — Telephone Encounter (Signed)
Seen in the ER on 06/25/13 and don't feel it's her heart and she should follow up with her PCP

## 2013-06-29 ENCOUNTER — Ambulatory Visit (INDEPENDENT_AMBULATORY_CARE_PROVIDER_SITE_OTHER): Payer: PRIVATE HEALTH INSURANCE | Admitting: Family Medicine

## 2013-06-29 ENCOUNTER — Encounter: Payer: Self-pay | Admitting: Family Medicine

## 2013-06-29 VITALS — BP 122/84 | HR 70 | Temp 98.1°F | Wt 188.0 lb

## 2013-06-29 DIAGNOSIS — R079 Chest pain, unspecified: Secondary | ICD-10-CM

## 2013-06-29 NOTE — Progress Notes (Signed)
  Subjective:    Patient ID: [name], female    DOB: 1946-10-21, 67 y.o.   MRN: [id number]  HPI  ER Follow up  Chest Wall Pain Seen recently in ER - large work up see their notes.  No evidence of ACS.  Currenlty pain is described as nonspecific ache in lower sternum somewhat worse with movement but not walking or climbing.  n unusual shortness of breath.  Did have a little leg edema before went to ER where was given lasix this has resolved.  She wonders if her chest and diffuse muscles aches - shoulders and arms could be due to crestor that she is now taking three times per week  Review of Symptoms - see HPI  PMH - Smoking status noted.     Review of Systems     Objective:   Physical Exam  Alert no acute distress Able to get up and down from exam table without problems Heart - Regular rate and rhythm.  No murmurs, gallops or rubs.    Lungs:  Normal respiratory effort, chest expands symmetrically. Lungs are clear to auscultation, no crackles or wheezes. Extremities:  No cyanosis, edema, or deformity noted with good range of motion of all major joints.   Abdomen: soft and non-tender without masses, organomegaly or hernias noted.  No guarding or rebound Chest - mildly tender lower sternum with palpation no deformity      Assessment & Plan:   Chest Pain - no signs of ACS or chf.  Most consistent with musculoskeletal  Pain. Try tylenol and monitor

## 2013-06-29 NOTE — Patient Instructions (Addendum)
For the muscle aches - I do not think it is your heart or any infection - I would take Tylenol 2 (325 mg) tabs up to three times daily for the pain as needed  Make an appointment to see Dr Lamar Benes in 2-3 weeks  Bring ALL your medication  Bottles with you the next visit  If you have any severe pain or shortness of breath call us

## 2013-07-07 ENCOUNTER — Other Ambulatory Visit: Payer: Self-pay | Admitting: *Deleted

## 2013-07-07 ENCOUNTER — Telehealth: Payer: Self-pay | Admitting: *Deleted

## 2013-07-07 DIAGNOSIS — D500 Iron deficiency anemia secondary to blood loss (chronic): Secondary | ICD-10-CM

## 2013-07-07 NOTE — Telephone Encounter (Signed)
Please see separate note  From 30 minutes ago, reviewed H/H from 06/25/2013, improved since Iron infusion on 06/13/2013. It said to repeat H/H in 4 weeks but if she is already set up for H/H on 07/11/2013 then go ahead  With it.

## 2013-07-07 NOTE — Telephone Encounter (Signed)
Please call pt, Hgb has improved since the Iron infusion on 06/13/2013 but still low. Please recheck H/H in 4 weeks ----- Message ----- From: Hulan Saas, RN Sent: 07/07/2013 2:35 PM To: [name], MD Left a message for patient to call me. Labs in EPIC.

## 2013-07-07 NOTE — Telephone Encounter (Signed)
Message copied by Hulan Saas on Thu Jul 07, 2013  2:30 PM ------      Message from: Hulan Saas      Created: Tue May 31, 2013  8:36 AM       Call and remind patient CBC for Db on 07/11/13 ------

## 2013-07-07 NOTE — Telephone Encounter (Signed)
Patient had labs done on 06/25/13.Results sent to Dr. [name] for review.

## 2013-07-07 NOTE — Telephone Encounter (Signed)
Patient notified to come for lab work next week.

## 2013-07-08 ENCOUNTER — Ambulatory Visit: Payer: PRIVATE HEALTH INSURANCE | Admitting: Physician Assistant

## 2013-07-08 ENCOUNTER — Telehealth: Payer: Self-pay | Admitting: Internal Medicine

## 2013-07-08 NOTE — Telephone Encounter (Signed)
Patient reports she has noticed mucous that is clear with odor when she wipes after a bowel movement. She thought it was from the vaginal but she went to GYN and it is not. States she has noticed this since having a colonoscopy. Scheduled with [name], PA on 07/11/13 at 1:30 PM.

## 2013-07-11 ENCOUNTER — Other Ambulatory Visit (INDEPENDENT_AMBULATORY_CARE_PROVIDER_SITE_OTHER): Payer: PRIVATE HEALTH INSURANCE

## 2013-07-11 ENCOUNTER — Encounter: Payer: Self-pay | Admitting: Physician Assistant

## 2013-07-11 ENCOUNTER — Ambulatory Visit (INDEPENDENT_AMBULATORY_CARE_PROVIDER_SITE_OTHER): Payer: PRIVATE HEALTH INSURANCE | Admitting: Physician Assistant

## 2013-07-11 VITALS — BP 100/74 | HR 60 | Ht 65.0 in | Wt 186.8 lb

## 2013-07-11 DIAGNOSIS — K648 Other hemorrhoids: Secondary | ICD-10-CM

## 2013-07-11 DIAGNOSIS — K6289 Other specified diseases of anus and rectum: Secondary | ICD-10-CM

## 2013-07-11 DIAGNOSIS — D509 Iron deficiency anemia, unspecified: Secondary | ICD-10-CM

## 2013-07-11 LAB — CBC WITH DIFFERENTIAL/PLATELET
Basophils Relative: 0.6 % (ref 0.0–3.0)
Eosinophils Relative: 2 % (ref 0.0–5.0)
HCT: 30.5 % — ABNORMAL LOW (ref 36.0–46.0)
Lymphs Abs: 1.2 10*3/uL (ref 0.7–4.0)
MCV: 86.9 fl (ref 78.0–100.0)
Monocytes Absolute: 0.4 10*3/uL (ref 0.1–1.0)
Neutro Abs: 2.1 10*3/uL (ref 1.4–7.7)
RBC: 3.51 Mil/uL — ABNORMAL LOW (ref 3.87–5.11)
WBC: 3.8 10*3/uL — ABNORMAL LOW (ref 4.5–10.5)

## 2013-07-11 MED ORDER — METRONIDAZOLE 250 MG PO TABS: 250.0000 mg | ORAL_TABLET | Freq: Three times a day (TID) | ORAL | Status: AC

## 2013-07-11 MED ORDER — HYDROCORTISONE ACETATE 25 MG RE SUPP: RECTAL | Status: DC

## 2013-07-11 NOTE — Patient Instructions (Addendum)
We sent prescriptions to [facility]. If the suppositories are too expensive, you can get the Hydrocortisone suppositories over the counter.  Follow up with Dr. Delfin Edis as needed.

## 2013-07-11 NOTE — Progress Notes (Signed)
Subjective:    Patient ID: [name], female    DOB: 06-10-1946, 67 y.o.   MRN: [id number]  HPI [name] is a pleasant 67 year old African American female known to Dr. Delfin Edis who has history of gastric AVMs which have required prior APC. She last had endoscopy in February 2012. She also has history of adenomatous colon polyps and family history of colon cancer. Her last colonoscopy was done in August of 2011 with 4 polyps removed. She is due for followup in 2016. Patient has multiple medical problems including chronic kidney disease, atrial fibrillation, congestive heart failure, hypertension, hyperlipidemia, and is status post pacemaker placement. She comes in today with complaints of mucus in her stools. On close questioning she apparently has been having problems over the past few years with mucus in her stools but is disturbed because sometimes she has drainage of mucus without a bowel movement and says that the mucus is malodorous. She states that she had GYN evaluation within the past year and was tested and does not have any bacterial infection. She says she tends to be chronically constipated and will use a variety of measures for bowel movements including occasional enemas, prune juice ,laxatives etc. She denies any current abdominal pain. She denies any rectal pain or pressure and has not noticed any bleeding. She says sometimes she sees mucus all by itself another times mucus mixed with her bowel movements.    Review of Systems  Constitutional: Negative.   HENT: Negative.   Eyes: Negative.   Respiratory: Negative.   Cardiovascular: Negative.   Gastrointestinal: Positive for constipation.  Endocrine: Negative.   Genitourinary: Negative.   Musculoskeletal: Negative.   Skin: Negative.   Allergic/Immunologic: Negative.   Neurological: Negative.   Hematological: Negative.   Psychiatric/Behavioral: Negative.    Outpatient Prescriptions Prior to Visit  Medication Sig  Dispense Refill  . albuterol (PROVENTIL HFA;VENTOLIN HFA) 108 (90 BASE) MCG/ACT inhaler Inhale 2 puffs into the lungs every 4 (four) hours as needed. For shortness of breath       . ALPRAZolam (XANAX) 0.5 MG tablet Take 1 tablet (0.5 mg total) by mouth daily as needed for anxiety (need appointment with Dr. Lamar Benes before more refills.).  15 tablet  0  . aspirin 81 MG tablet Take 81 mg by mouth daily.      . Calcium Carb-Cholecalciferol (CALCIUM-VITAMIN D) 600-400 MG-UNIT TABS Take 1 tablet by mouth 2 (two) times daily.  60 tablet  12  . carvedilol (COREG) 6.25 MG tablet Take 1.5 tablets AM and 2 tablet PM.      . colchicine 0.6 MG tablet Take 1 tablet (0.6 mg total) by mouth daily.  14 tablet  0  . diclofenac sodium (VOLTAREN) 1 % GEL Apply 2 g topically 4 (four) times daily.  100 g  1  . ferrous sulfate 325 (65 FE) MG tablet TAKE 1 TABLET BY MOUTH TWICE DAILY WITH MEALS TO KEEP BLOOD COUNTS UP  60 tablet  3  . furosemide (LASIX) 20 MG tablet Take 1 tablet (20 mg total) by mouth daily.  90 tablet  3  . hydrocortisone (ANUSOL-HC) 25 MG suppository Place 25 mg rectally as needed. Insert one nightly      . hydrocortisone cream 0.5 % Apply topically 2 (two) times daily. For itchiness on back  30 g  0  . IRON DEXTRAN IJ Inject 1 Applicatorful as directed once.      . Multiple Vitamin (MULITIVITAMIN WITH MINERALS) TABS Take 1 tablet by mouth  daily.      [name] NEXIUM 40 MG capsule TAKE 1 CAPSULE BY MOUTH EVERY MORNING BEFORE BREAKFAST  30 capsule  3  . nitroGLYCERIN (NITROSTAT) 0.4 MG SL tablet Place 0.4 mg under the tongue every 5 (five) minutes as needed. For chest pain      . Olopatadine HCl 0.2 % SOLN Apply 1 drop to eye daily as needed. For allergies      . polyvinyl alcohol (LIQUIFILM TEARS) 1.4 % ophthalmic solution Place 1 drop into both eyes 3 (three) times daily as needed (for dry itching eye).      . rosuvastatin (CRESTOR) 20 MG tablet Take 20 mg by mouth daily. Take 1 tablet Monday, Wednesday, and  Friday.      . sucralfate (CARAFATE) 1 G tablet TAKE 1 TABLET BY MOUTH TWICE DAILY  60 tablet  3   Facility-Administered Medications Prior to Visit  Medication Dose Route Frequency Provider Last Rate Last Dose  . cyanocobalamin ((VITAMIN B-12)) injection 1,000 mcg  1,000 mcg Intramuscular Q30 days [name], MD   1,000 mcg at 09/16/11 1341   Allergies  Allergen Reactions  . Ace Inhibitors Other (See Comments)    REACTION: angioedema   Patient Active Problem List   Diagnosis Date Noted  . Other malaise and fatigue 05/25/2013  . Itch 05/25/2013  . Left-sided headache 03/11/2013  . Debilitated patient 03/02/2013  . Right knee pain 02/16/2013  . Heart palpitations 10/06/2012  . Acute on chronic kidney failure 08/28/2012  . Gout attack 08/27/2012  . Preventative health care 08/18/2012  . Complete heart block 04/03/2011  . Anemia 02/17/2011  . AVM (arteriovenous malformation) 02/13/2011  . PACEMAKER-[place] 11/28/2010  . CHRONIC KIDNEY DISEASE STAGE III (MODERATE) 09/24/2010  . Chronic diastolic heart failure [id number]  . ARTHRITIS 07/24/2010  . ANXIETY 07/23/2010  . Atrial fibrillation 07/16/2010  . HYPERLIPIDEMIA 02/11/2007  . HYPERTENSION, BENIGN SYSTEMIC 02/11/2007  . APNEA, SLEEP 02/11/2007   History  Substance Use Topics  . Smoking status: Former Smoker -- 0.10 packs/day for 50 years    Types: Cigarettes    Quit date: 08/16/2011  . Smokeless tobacco: Never Used     Comment: quit x 3 months  . Alcohol Use: No     Comment: quit 08/16/2011   family history includes Alcohol abuse in her brother and father; Cancer in her brother; Colon cancer in her brother; Diabetes in her brother and sister; Heart attack in her brother and father; Heart disease in her mother and sister; Hypertension in her brother, father, mother, and sister; and Stroke in her father and mother.    Objective:   Physical Exam   well-developed older African-American female in no acute distress, pleasant  blood pressure 100/74 pulse 60 height 5 foot 5 weight 186. HEENT; nontraumatic normocephalic EOMI PERRLA sclera anicteric, Supple no JVD, Cardiovascular; irregular rate and rhythm with S1-S2 no murmur or gallop,Pulm;clear bilaterally, Abdomen; soft nontender nondistended bowel sounds are active, Rectal ;exam no external hemorrhoids or lesions noted no obvious drainage, On anoscopy he does have an internal hemorrhoid, there is no obvious mucoid drainage. No stool in the rectal vault, Extremities; no clubbing cyanosis or edema skin warm and dry, Psych; mood and affect normal and appropriate        Assessment & Plan:  #73 67 year old female with complaints of mucoid drainage from her rectum., Chronic. She has a negative rectal exam and negative anoscopy with the exception of an internal hemorrhoid. I suspect the mucous is benign  and related to chronic constipation however this could be from internal hemorrhoids or even a mild hidradenitis. #2 history of adenomatous colon polyps last colonoscopy August 2011 due for followup 2016 #3 positive family history of colon cancer in patient's sibling #4 history of gastric AVM status post APC 2012 #5 iron deficiency anemia secondary to above #6 atrial fibrillation #7 chronic kidney disease #8 congestive heart failure  Plan; will give her a trial of Anusol-HC suppositories each bedtime x10-12 days then as needed and also a short course of Flagyl 250 mg by mouth 3 times daily x7 days She will follow up with Dr. [name] as needed Followup colonoscopy August 2016

## 2013-07-11 NOTE — Progress Notes (Signed)
Reviewed, mucous in stool in absence of other  Worrisome symptoms is likely due to IBS/rectal irritation. Agree with disposition.

## 2013-07-15 ENCOUNTER — Ambulatory Visit (INDEPENDENT_AMBULATORY_CARE_PROVIDER_SITE_OTHER): Payer: PRIVATE HEALTH INSURANCE | Admitting: Family Medicine

## 2013-07-15 ENCOUNTER — Encounter: Payer: Self-pay | Admitting: Family Medicine

## 2013-07-15 VITALS — BP 101/69 | HR 70 | Temp 98.7°F | Ht 65.0 in | Wt 187.8 lb

## 2013-07-15 DIAGNOSIS — R059 Cough, unspecified: Secondary | ICD-10-CM

## 2013-07-15 DIAGNOSIS — R05 Cough: Secondary | ICD-10-CM

## 2013-07-15 MED ORDER — BENZONATATE 200 MG PO CAPS: 200.0000 mg | ORAL_CAPSULE | Freq: Three times a day (TID) | ORAL | Status: DC | PRN

## 2013-07-15 NOTE — Progress Notes (Signed)
  Subjective:    Patient ID: [name], female    DOB: 06-Mar-1946, 67 y.o.   MRN: [id number]  HPI  # Cough - 3 weeks ago. Seen at ER 7/12 for chest wall pain.  - on 7/14 started producing phlegm, clear and sometimes "dark". Only produces phlegm every now and then. - 2-3 episodes of coughing a day, lasts 20-40 minutes - Chest wall pain, sternal area, does not feel deep. Also has bilateral rib pain - Denies fever/chills, nausea/vomiting.  Long smoking history, 55 years, quit last year.  Review of Systems Negative except as above    Objective: .vitals   Physical Exam Filed Vitals:   07/15/13 1527  BP: 101/69  Pulse: 70  Temp: 98.7 F (37.1 C)    General: Sitting comfortably in chair, no acute distress HEENT: Nasopharynx clear of rhinorrhea, oral mucosa moist without erythema, tympanic membranes pearly gray with +light reflex Cardiac: normal s1/s2, RRR, no m/r/g. Palpation of sternal area elicits some tenderness. Resp: CTAB in all lobes, no wheezes/rhonchi/crackles appreciated. Patient does not cough during exam. Patient was able to transfer herself to exam table without assistance     Assessment & Plan:  See Problem List documentation

## 2013-07-15 NOTE — Assessment & Plan Note (Addendum)
Started 3-4 weeks ago. Seen at ED on 7/11. CXR negative without evidence of pneumonia Likely viral in origin Does not want any pain medication for chest wall pain. Will try Tessalon 200mg  q8h PRN to suppress cough, patient counseled not to chew tablets, agreed and understood instructions. Patient to return if symptoms do not improve she will call, otherwise 3 month follow-up/physical

## 2013-07-15 NOTE — Patient Instructions (Addendum)
Take the Tessalon every 8 hours as needed to help with the cough.  Cough, Adult  A cough is a reflex that helps clear your throat and airways. It can help heal the body or may be a reaction to an irritated airway. A cough may only last 2 or 3 weeks (acute) or may last more than 8 weeks (chronic).  CAUSES Acute cough:  Viral or bacterial infections. Chronic cough:  Infections.  Allergies.  Asthma.  Post-nasal drip.  Smoking.  Heartburn or acid reflux.  Some medicines.  Chronic lung problems (COPD).  Cancer. SYMPTOMS   Cough.  Fever.  Chest pain.  Increased breathing rate.  High-pitched whistling sound when breathing (wheezing).  Colored mucus that you cough up (sputum). TREATMENT   A bacterial cough may be treated with antibiotic medicine.  A viral cough must run its course and will not respond to antibiotics.  Your caregiver may recommend other treatments if you have a chronic cough. HOME CARE INSTRUCTIONS   Only take over-the-counter or prescription medicines for pain, discomfort, or fever as directed by your caregiver. Use cough suppressants only as directed by your caregiver.  Use a cold steam vaporizer or humidifier in your bedroom or home to help loosen secretions.  Sleep in a semi-upright position if your cough is worse at night.  Rest as needed.  Stop smoking if you smoke. SEEK IMMEDIATE MEDICAL CARE IF:   You have pus in your sputum.  Your cough starts to worsen.  You cannot control your cough with suppressants and are losing sleep.  You begin coughing up blood.  You have difficulty breathing.  You develop pain which is getting worse or is uncontrolled with medicine.  You have a fever. MAKE SURE YOU:   Understand these instructions.  Will watch your condition.  Will get help right away if you are not doing well or get worse. Document Released: 05/30/2011 Document Revised: 02/23/2012 Document Reviewed: 05/30/2011 [facility]  Patient Information 2014 [place].

## 2013-07-28 ENCOUNTER — Encounter: Payer: Self-pay | Admitting: Internal Medicine

## 2013-07-29 ENCOUNTER — Other Ambulatory Visit: Payer: Self-pay | Admitting: Family Medicine

## 2013-08-08 ENCOUNTER — Telehealth: Payer: Self-pay | Admitting: *Deleted

## 2013-08-08 DIAGNOSIS — D649 Anemia, unspecified: Secondary | ICD-10-CM

## 2013-08-08 NOTE — Telephone Encounter (Signed)
Spoke with patient and she will come for labs. 

## 2013-08-08 NOTE — Telephone Encounter (Signed)
Message copied by Hulan Saas on Mon Aug 08, 2013  9:06 AM ------      Message from: Hulan Saas      Created: Thu Jul 07, 2013  2:54 PM       Call and remind due for CBC for DB on 08/08/13. ------

## 2013-08-08 NOTE — Telephone Encounter (Signed)
Left a message for patient to call me. 

## 2013-08-11 ENCOUNTER — Other Ambulatory Visit: Payer: Self-pay | Admitting: *Deleted

## 2013-08-11 ENCOUNTER — Other Ambulatory Visit (INDEPENDENT_AMBULATORY_CARE_PROVIDER_SITE_OTHER): Payer: PRIVATE HEALTH INSURANCE

## 2013-08-11 DIAGNOSIS — D649 Anemia, unspecified: Secondary | ICD-10-CM

## 2013-08-11 LAB — CBC WITH DIFFERENTIAL/PLATELET
Basophils Relative: 0.4 % (ref 0.0–3.0)
Eosinophils Absolute: 0.1 10*3/uL (ref 0.0–0.7)
MCHC: 33.9 g/dL (ref 30.0–36.0)
MCV: 86.6 fl (ref 78.0–100.0)
Monocytes Absolute: 0.4 10*3/uL (ref 0.1–1.0)
Neutrophils Relative %: 56 % (ref 43.0–77.0)
Platelets: 193 10*3/uL (ref 150.0–400.0)
RDW: 14.5 % (ref 11.5–14.6)

## 2013-08-12 ENCOUNTER — Telehealth: Payer: Self-pay | Admitting: *Deleted

## 2013-08-12 ENCOUNTER — Other Ambulatory Visit: Payer: Self-pay | Admitting: *Deleted

## 2013-08-12 DIAGNOSIS — D509 Iron deficiency anemia, unspecified: Secondary | ICD-10-CM

## 2013-08-12 MED ORDER — ALPRAZOLAM 0.5 MG PO TABS: 0.5000 mg | ORAL_TABLET | ORAL | Status: DC | PRN

## 2013-08-12 NOTE — Telephone Encounter (Signed)
I approved it but she needs to come back in the next 3-4 weeks before she can get a refill.

## 2013-08-12 NOTE — Telephone Encounter (Signed)
Patient called and left message on MD/Rx line requesting refill of Xanax.  Will route request to Dr. Lamar Benes.  Nolene Ebbs, RN

## 2013-08-12 NOTE — Telephone Encounter (Signed)
Called patient and left message that Rx sent to her pharmacy and to schedule appt with Dr. Lamar Benes in 3-4 weeks.   Nolene Ebbs, RN

## 2013-08-23 ENCOUNTER — Other Ambulatory Visit: Payer: Self-pay

## 2013-08-23 MED ORDER — CARVEDILOL 6.25 MG PO TABS: ORAL_TABLET | ORAL | Status: DC

## 2013-09-12 ENCOUNTER — Encounter: Payer: Self-pay | Admitting: Family Medicine

## 2013-09-12 ENCOUNTER — Ambulatory Visit (INDEPENDENT_AMBULATORY_CARE_PROVIDER_SITE_OTHER): Payer: PRIVATE HEALTH INSURANCE | Admitting: Family Medicine

## 2013-09-12 VITALS — BP 113/77 | HR 70 | Temp 98.6°F | Ht 65.0 in | Wt 185.0 lb

## 2013-09-12 DIAGNOSIS — R05 Cough: Secondary | ICD-10-CM

## 2013-09-12 DIAGNOSIS — E785 Hyperlipidemia, unspecified: Secondary | ICD-10-CM

## 2013-09-12 DIAGNOSIS — R042 Hemoptysis: Secondary | ICD-10-CM

## 2013-09-12 MED ORDER — ROSUVASTATIN CALCIUM 20 MG PO TABS: 20.0000 mg | ORAL_TABLET | Freq: Every day | ORAL | Status: DC

## 2013-09-12 NOTE — Assessment & Plan Note (Addendum)
Crestor refill given today. Last lipid panel April 2014.

## 2013-09-12 NOTE — Progress Notes (Signed)
  Subjective:    Patient ID: [name], female    DOB: [birth year], 67 y.o.   MRN: [id number]  HPI  # Chronic cough - Has had a persistent cough for the last 3-4 months. Seen in July in ED and clinic visit in early August. - Given Tussionex at last clinic visit, she used it for a little while with some relief but stopped - Cough occurs every day, no "fits" but coughs throughout day - Recently started noticing some blood tinge to sputum, also yellow at times - Extensive smoking history, started at age 40, 1-1.5ppd until 1-2 years ago when she quit ROS: denies chest pain, fevers/chills, diarrhea. Endorses shortness of breath with exertion, constipation. Weight down 16lbs since 10/2012.  Review of Systems See HPI    Objective:   Physical Exam BP 113/77  Pulse 70  Temp(Src) 98.6 F (37 C) (Oral)  Ht 5\' 5"  (1.651 m)  Wt 185 lb (83.915 kg)  BMI 30.79 kg/m2  General: NAD, well nourished HEENT: PERRL, EOMI, MMM, no rhinorrhea, no erythema of oropharynx CV: RRR, normal heart sounds, no murmurs appreciated Resp: CTAB, normal effort Extremities: no edema, 2+ PT and radial pulses bilaterally     Assessment & Plan:  See Problem List documentation

## 2013-09-12 NOTE — Patient Instructions (Signed)
It was nice to see you again.  I will call or send a letter with the results of the CT scan of your chest. Please go to the Pulmonary clinic to have your lungs tested. We will change your medications after that visit if needed.

## 2013-09-12 NOTE — Assessment & Plan Note (Signed)
Continues to have cough for 3-4 months, now endorses some hemoptysis. She has never been diagnosed with COPD, but says she thinks she has had PFTs done in the past. Given her long history of smoking, strong concern for cancer and chronic bronchitis.  Plan: - CT chest - Pulmonary clinic for PFTs - Likely will require long acting inhaled steroid/beta agonist.

## 2013-09-14 ENCOUNTER — Ambulatory Visit (HOSPITAL_COMMUNITY)
Admission: RE | Admit: 2013-09-14 | Discharge: 2013-09-14 | Disposition: A | Discharge status: Home / Self Care | Payer: PRIVATE HEALTH INSURANCE | Source: Ambulatory Visit | Attending: Family Medicine | Admitting: Family Medicine

## 2013-09-14 ENCOUNTER — Encounter (HOSPITAL_COMMUNITY): Payer: Self-pay

## 2013-09-14 DIAGNOSIS — R042 Hemoptysis: Secondary | ICD-10-CM

## 2013-09-14 DIAGNOSIS — I359 Nonrheumatic aortic valve disorder, unspecified: Secondary | ICD-10-CM | POA: Insufficient documentation

## 2013-09-15 ENCOUNTER — Encounter: Payer: Self-pay | Admitting: Family Medicine

## 2013-09-22 ENCOUNTER — Telehealth: Payer: Self-pay | Admitting: *Deleted

## 2013-09-22 NOTE — Telephone Encounter (Signed)
Patient will come for labs.  

## 2013-09-22 NOTE — Telephone Encounter (Signed)
Message copied by Hulan Saas on Thu Sep 22, 2013 10:58 AM ------      Message from: Hulan Saas      Created: Fri Aug 12, 2013  9:19 AM       Call and remind patient due for CBC,iron panel for DB on 09/16/13. Lab in [facility] ------

## 2013-09-29 ENCOUNTER — Other Ambulatory Visit (INDEPENDENT_AMBULATORY_CARE_PROVIDER_SITE_OTHER): Payer: PRIVATE HEALTH INSURANCE

## 2013-09-29 DIAGNOSIS — D509 Iron deficiency anemia, unspecified: Secondary | ICD-10-CM

## 2013-09-29 LAB — CBC WITH DIFFERENTIAL/PLATELET
Basophils Relative: 0.2 % (ref 0.0–3.0)
Eosinophils Absolute: 0.1 10*3/uL (ref 0.0–0.7)
Hemoglobin: 9.2 g/dL — ABNORMAL LOW (ref 12.0–15.0)
Lymphocytes Relative: 17.8 % (ref 12.0–46.0)
Lymphs Abs: 0.9 10*3/uL (ref 0.7–4.0)
MCHC: 34 g/dL (ref 30.0–36.0)
Neutro Abs: 3.6 10*3/uL (ref 1.4–7.7)
RBC: 3.17 Mil/uL — ABNORMAL LOW (ref 3.87–5.11)
WBC: 5.3 10*3/uL (ref 4.5–10.5)

## 2013-09-29 LAB — IBC PANEL: Saturation Ratios: 8.5 % — ABNORMAL LOW (ref 20.0–50.0)

## 2013-09-30 ENCOUNTER — Other Ambulatory Visit: Payer: Self-pay | Admitting: *Deleted

## 2013-09-30 DIAGNOSIS — D509 Iron deficiency anemia, unspecified: Secondary | ICD-10-CM

## 2013-10-06 ENCOUNTER — Other Ambulatory Visit (HOSPITAL_COMMUNITY): Payer: Self-pay | Admitting: Internal Medicine

## 2013-10-06 ENCOUNTER — Ambulatory Visit (HOSPITAL_COMMUNITY)
Admission: RE | Admit: 2013-10-06 | Discharge: 2013-10-06 | Disposition: A | Discharge status: Home / Self Care | Payer: PRIVATE HEALTH INSURANCE | Source: Ambulatory Visit | Attending: Internal Medicine | Admitting: Internal Medicine

## 2013-10-06 ENCOUNTER — Encounter (HOSPITAL_COMMUNITY): Payer: Self-pay

## 2013-10-06 VITALS — BP 131/84 | HR 70 | Temp 97.7°F | Resp 18

## 2013-10-06 DIAGNOSIS — D509 Iron deficiency anemia, unspecified: Secondary | ICD-10-CM | POA: Insufficient documentation

## 2013-10-06 MED ORDER — SODIUM CHLORIDE 0.9 % IV SOLN
Freq: Once | INTRAVENOUS | Status: AC
  Administered 2013-10-06: 10:00:00 via INTRAVENOUS

## 2013-10-06 MED ORDER — METHYLPREDNISOLONE SODIUM SUCC 40 MG IJ SOLR
40.0000 mg | Freq: Once | INTRAMUSCULAR | Status: AC
  Administered 2013-10-06: 40 mg via INTRAVENOUS
  Filled 2013-10-06: qty 1

## 2013-10-06 MED ORDER — DIPHENHYDRAMINE HCL 50 MG/ML IJ SOLN
25.0000 mg | Freq: Once | INTRAMUSCULAR | Status: AC
  Administered 2013-10-06: 25 mg via INTRAVENOUS
  Filled 2013-10-06: qty 1

## 2013-10-06 MED ORDER — SODIUM CHLORIDE 0.9 % IV SOLN
1020.0000 mg | Freq: Once | INTRAVENOUS | Status: AC
  Administered 2013-10-06: 1020 mg via INTRAVENOUS
  Filled 2013-10-06: qty 34

## 2013-10-28 ENCOUNTER — Encounter: Payer: PRIVATE HEALTH INSURANCE | Admitting: Internal Medicine

## 2013-11-01 ENCOUNTER — Ambulatory Visit (INDEPENDENT_AMBULATORY_CARE_PROVIDER_SITE_OTHER): Payer: PRIVATE HEALTH INSURANCE | Admitting: Pharmacist

## 2013-11-01 ENCOUNTER — Encounter: Payer: Self-pay | Admitting: Pharmacist

## 2013-11-01 VITALS — BP 134/91 | HR 70 | Ht 65.0 in | Wt 189.0 lb

## 2013-11-01 DIAGNOSIS — R05 Cough: Secondary | ICD-10-CM

## 2013-11-01 DIAGNOSIS — J449 Chronic obstructive pulmonary disease, unspecified: Secondary | ICD-10-CM | POA: Insufficient documentation

## 2013-11-01 MED ORDER — TIOTROPIUM BROMIDE MONOHYDRATE 18 MCG IN CAPS: 18.0000 ug | ORAL_CAPSULE | Freq: Every day | RESPIRATORY_TRACT | Status: DC

## 2013-11-01 MED ORDER — ALBUTEROL SULFATE HFA 108 (90 BASE) MCG/ACT IN AERS: 2.0000 | INHALATION_SPRAY | RESPIRATORY_TRACT | Status: DC | PRN

## 2013-11-01 NOTE — Patient Instructions (Signed)
Thank you so much for coming in today!  Start using Spiriva by inhaling one capsule each day. Make sure that the capsule is completely empty each time you use it, even if it takes 2 tries.   Continue to use your albuterol inhaler if you need it. We sent you a new one to the pharmacy since the one you had was expired.   Follow-up with Dr. Lamar Benes in 3-4 weeks.

## 2013-11-01 NOTE — Assessment & Plan Note (Signed)
Spirometry evaluation reveals moderate obstructive lung disease corresponding to GOLD Classification B based on spirometry. She has not been diagnosed with COPD previously. Patient has been experiencing coughing for 4 months and taking albuterol.  Started Spiriva one inhalation daily. Educated patient on purpose, proper use and potential adverse effects.  Reviewed results of pulmonary function tests.  Pt verbalized understanding of results and education.  Written pt instructions provided.  F/U Clinic visit in 3-4 weeks. Total time in face to face counseling 50 minutes.  Patient seen with Elisabeth Cara, PharmD Candidate, Orson Gear, PharmD Candidate, and Wilfred Curtis, PharmD Resident.

## 2013-11-01 NOTE — Progress Notes (Signed)
S:    Patient arrives independently and in good spirits. She presents for lung function evaluation. Patient reports breathing has been difficult for the past 1-2 years. She has noticed she is unable to dance like she wants to without having to stop and rest. Patient reports quitting smoking 1 year ago. She previously smoked 1-1.5 packs per day for 56 years She has had a cough for the past 3-4 months with some hemoptysis reported. She reports waking up most nights with SOB. She denies using her albuterol inhaler.   O: mMRC score= >/= 2.   See "scanned report" or Documentation Flowsheet (discrete results - PFTs) for  Spirometry results. Patient provided good effort while attempting spirometry.   Albuterol Neb  Lot# [id number]     Exp. Jul 2016  A/P: Spirometry evaluation reveals moderate obstructive lung disease corresponding to GOLD Classification B based on spirometry. She has not been diagnosed with COPD previously. Patient has been experiencing coughing for 4 months and taking albuterol.  Started Spiriva one inhalation daily. Educated patient on purpose, proper use and potential adverse effects.  Reviewed results of pulmonary function tests.  Pt verbalized understanding of results and education.  Written pt instructions provided.  F/U Clinic visit in 3-4 weeks. Total time in face to face counseling 50 minutes.  Patient seen with Elisabeth Cara, PharmD Candidate, Orson Gear, PharmD Candidate, and Wilfred Curtis, PharmD Resident. [name]

## 2013-11-02 ENCOUNTER — Encounter: Payer: Self-pay | Admitting: Internal Medicine

## 2013-11-02 ENCOUNTER — Ambulatory Visit (INDEPENDENT_AMBULATORY_CARE_PROVIDER_SITE_OTHER): Payer: PRIVATE HEALTH INSURANCE | Admitting: Internal Medicine

## 2013-11-02 VITALS — BP 132/80 | HR 69 | Ht 65.0 in | Wt 190.0 lb

## 2013-11-02 DIAGNOSIS — I5032 Chronic diastolic (congestive) heart failure: Secondary | ICD-10-CM

## 2013-11-02 DIAGNOSIS — I442 Atrioventricular block, complete: Secondary | ICD-10-CM

## 2013-11-02 DIAGNOSIS — Z950 Presence of cardiac pacemaker: Secondary | ICD-10-CM

## 2013-11-02 DIAGNOSIS — E785 Hyperlipidemia, unspecified: Secondary | ICD-10-CM

## 2013-11-02 DIAGNOSIS — I4891 Unspecified atrial fibrillation: Secondary | ICD-10-CM

## 2013-11-02 DIAGNOSIS — I10 Essential (primary) hypertension: Secondary | ICD-10-CM

## 2013-11-02 DIAGNOSIS — E7800 Pure hypercholesterolemia, unspecified: Secondary | ICD-10-CM

## 2013-11-02 NOTE — Patient Instructions (Signed)
Your physician recommends that you continue on your current medications as directed. Please refer to the Current Medication list given to you today.  Your physician wants you to follow-up in: 6 months with Truitt Merle. You will receive a reminder letter in the mail two months in advance. If you don't receive a letter, please call our office to schedule the follow-up appointment.  Your physician wants you to follow-up in: one year with Dr. Rayann Heman.  You will receive a reminder letter in the mail two months in advance. If you don't receive a letter, please call our office to schedule the follow-up appointment.

## 2013-11-02 NOTE — Progress Notes (Signed)
PCP: Tawanna Sat, MD  [name] is a 67 y.o. female who presents today for routine electrophysiology followup.  Since last being seen in our clinic, the patient reports doing very well. She has recently been seen by family medicine who began Spriva and Albuterol for asthma exacerbation.  She complains of occasional mid sternal chest pains not associated with exertion.  These are relieved by taking Xanax.  Today, she denies symptoms of palpitations, shortness of breath,  lower extremity edema, dizziness, presyncope, or syncope.  The patient is otherwise without complaint today.   Past Medical History  Diagnosis Date  . Angioedema     2/2 ACE  . RLS (restless legs syndrome)     Dx 06/2007  . Anemia   . Arteriovenous malformation of stomach   . Permanent atrial fibrillation   . Renal failure     baseline creatinine 1.6  . Hyperlipidemia   . Depression   . Diastolic heart failure   . Arthritis   . Sleep apnea   . Asthma   . Hx of colonic polyp 08/13/10    adenomatous  . Fatty liver 07/26/10  . Panic attacks   . Hypertension   . Complete heart block     s/p PPM 1998  . Hypertrophic cardiomyopathy     dx by Dr [name] 2009  . Hx of colonoscopy   . GI bleed   . Panic attack   . DDD (degenerative disc disease)   . History of alcohol abuse Stopped Fall 2012  . History of tobacco use Quit Fall 2012  . Shortness of breath     sob on exertation  . CHF (congestive heart failure)   . Angina   . GERD (gastroesophageal reflux disease)   . AVM (arteriovenous malformation) of colon     small intestine; stomach  . Right arm pain 01/08/2012  . Hyperlipidemia   . Blood transfusion    Past Surgical History  Procedure Laterality Date  . Tubal ligation  04/01/2000  . Pacemaker insertion  [place], most recent gen change by [name] 4/12  . Cardiac catheterization    . Esophagogastroduodenoscopy  12/23/2011    Procedure: ESOPHAGOGASTRODUODENOSCOPY (EGD);  Surgeon: [name],  MD;  Location: [facility];  Service: Endoscopy;  Laterality: N/A;  . Givens capsule study  12/23/2011    Procedure: GIVENS CAPSULE STUDY;  Surgeon: [name], MD;  Location: [facility];  Service: Endoscopy;  Laterality: N/A;    Current Outpatient Prescriptions  Medication Sig Dispense Refill  . albuterol (PROVENTIL HFA;VENTOLIN HFA) 108 (90 BASE) MCG/ACT inhaler Inhale 2 puffs into the lungs every 4 (four) hours as needed. For shortness of breath  1 Inhaler  2  . ALPRAZolam (XANAX) 0.5 MG tablet Take 1 tablet (0.5 mg total) by mouth as needed for sleep or anxiety. You need an appointment with Dr Lamar Benes for more refills  30 tablet  0  . aspirin 81 MG tablet Take 81 mg by mouth daily.      . Calcium Carb-Cholecalciferol (CALCIUM-VITAMIN D) 600-400 MG-UNIT TABS Take 1 tablet by mouth 2 (two) times daily.  60 tablet  12  . carvedilol (COREG) 6.25 MG tablet Take 1.5 tablets AM and 2 tablet PM.  105 tablet  6  . colchicine 0.6 MG tablet Take 1 tablet (0.6 mg total) by mouth daily.  14 tablet  0  . ferrous sulfate 325 (65 FE) MG tablet TAKE 1 TABLET BY MOUTH TWICE DAILY  WITH MEALS TO KEEP BLOOD COUNTS UP  60 tablet  3  . furosemide (LASIX) 20 MG tablet Take 1 tablet (20 mg total) by mouth daily.  90 tablet  3  . hydrocortisone (ANUSOL-HC) 25 MG suppository Use 1 suppository at bedtime for 10 nights.  10 suppository  1  . Multiple Vitamin (MULITIVITAMIN WITH MINERALS) TABS Take 1 tablet by mouth daily.      [name] NEXIUM 40 MG capsule TAKE 1 CAPSULE BY MOUTH EVERY MORNING BEFORE BREAKFAST  30 capsule  3  . nitroGLYCERIN (NITROSTAT) 0.4 MG SL tablet Place 0.4 mg under the tongue every 5 (five) minutes as needed. For chest pain      . Olopatadine HCl 0.2 % SOLN Apply 1 drop to eye daily as needed. For allergies      . polyvinyl alcohol (LIQUIFILM TEARS) 1.4 % ophthalmic solution Place 1 drop into both eyes 3 (three) times daily as needed (for dry itching eye).      . rosuvastatin (CRESTOR) 20 MG tablet  Take 1 tablet (20 mg total) by mouth daily. Take 1 tablet Monday, Wednesday, and Friday.  30 tablet  3  . sucralfate (CARAFATE) 1 G tablet TAKE 1 TABLET BY MOUTH TWICE DAILY  60 tablet  3  . tiotropium (SPIRIVA HANDIHALER) 18 MCG inhalation capsule Place 1 capsule (18 mcg total) into inhaler and inhale daily.      . benzonatate (TESSALON) 200 MG capsule Take 1 capsule (200 mg total) by mouth 3 (three) times daily as needed for cough (Every 8 hours as needed for cough).  20 capsule  0  . metroNIDAZOLE (FLAGYL) 250 MG tablet        Current Facility-Administered Medications  Medication Dose Route Frequency Provider Last Rate Last Dose  . cyanocobalamin ((VITAMIN B-12)) injection 1,000 mcg  1,000 mcg Intramuscular Q30 days [name], MD   1,000 mcg at 09/16/11 1341    Physical Exam: Filed Vitals:   11/02/13 1034  BP: 132/80  Pulse: 69  Height: 5\' 5"  (1.651 m)  Weight: 190 lb (86.183 kg)    GEN- The patient is well appearing, alert and oriented x 3 today.   Head- normocephalic, atraumatic Eyes-  Sclera clear, conjunctiva pink Ears- hearing intact Oropharynx- clear Lungs- Clear to ausculation bilaterally, normal work of breathing Chest- pacemaker pocket is well healed Heart- Regular rate and rhythm, no murmurs, rubs or gallops, PMI not laterally displaced GI- soft, NT, ND, + BS Extremities- no clubbing, cyanosis, or edema  Pacemaker interrogation- reviewed in detail today,  See PACEART report  Assessment and Plan:  1. Complete heart block Normal pacemaker function See Pace Art report Due to dependance, I have decreased sensitivity to 62mV today.  2. Permanent afib Not a candidate for anticoagulation  3. HTN Stable No change required today  4. Atypical chest pain No further workup required at this time  5. HL She does not tolerate statins  6. Chronic diastolic dysfunction/ hocm Stable No change required today  Return to see Cecille Rubin in 6 months I will see in a  year

## 2013-11-03 NOTE — Progress Notes (Signed)
Patient ID: [name], female   DOB: [birth year], 67 y.o.   MRN: [id number] Reviewed: Agree with Dr. Graylin Shiver documentation and management.

## 2013-11-08 ENCOUNTER — Encounter: Payer: Self-pay | Admitting: Internal Medicine

## 2013-11-16 ENCOUNTER — Other Ambulatory Visit: Payer: Self-pay | Admitting: Family Medicine

## 2013-11-16 MED ORDER — FUROSEMIDE 20 MG PO TABS: 20.0000 mg | ORAL_TABLET | Freq: Every day | ORAL | Status: DC

## 2013-11-25 ENCOUNTER — Ambulatory Visit (INDEPENDENT_AMBULATORY_CARE_PROVIDER_SITE_OTHER): Payer: PRIVATE HEALTH INSURANCE | Admitting: Family Medicine

## 2013-11-25 ENCOUNTER — Encounter: Payer: Self-pay | Admitting: Family Medicine

## 2013-11-25 VITALS — BP 126/78 | HR 82 | Temp 98.0°F | Wt 190.0 lb

## 2013-11-25 DIAGNOSIS — R7303 Prediabetes: Secondary | ICD-10-CM

## 2013-11-25 DIAGNOSIS — Z131 Encounter for screening for diabetes mellitus: Secondary | ICD-10-CM

## 2013-11-25 DIAGNOSIS — I10 Essential (primary) hypertension: Secondary | ICD-10-CM

## 2013-11-25 DIAGNOSIS — R002 Palpitations: Secondary | ICD-10-CM

## 2013-11-25 DIAGNOSIS — J449 Chronic obstructive pulmonary disease, unspecified: Secondary | ICD-10-CM

## 2013-11-25 DIAGNOSIS — R7309 Other abnormal glucose: Secondary | ICD-10-CM

## 2013-11-25 LAB — POCT GLYCOSYLATED HEMOGLOBIN (HGB A1C): Hemoglobin A1C: 6

## 2013-11-25 MED ORDER — FERROUS SULFATE 325 (65 FE) MG PO TABS: ORAL_TABLET | ORAL | Status: DC

## 2013-11-25 MED ORDER — ALPRAZOLAM 0.5 MG PO TABS: 0.5000 mg | ORAL_TABLET | ORAL | Status: DC | PRN

## 2013-11-25 NOTE — Patient Instructions (Signed)
It was nice to see you again.  Continue taking the spiriva every day. If you still think it is causing your heart to flutter, call the clinic and schedule another appointment and we will discuss trying another medication to help with your COPD.

## 2013-11-26 DIAGNOSIS — R7303 Prediabetes: Secondary | ICD-10-CM | POA: Insufficient documentation

## 2013-11-26 NOTE — Assessment & Plan Note (Signed)
BP at goal. No changes made in medications.

## 2013-11-26 NOTE — Assessment & Plan Note (Signed)
She continues to get heart palps/flutter which she thinks may be worsened since starting spiriva. They do not last long, and she doesn't experience any other symptoms such as CP or dizziness. She continues to have anxiety issues, takes xanax as needed (refill given today) and she has been on his for a long time.

## 2013-11-26 NOTE — Assessment & Plan Note (Signed)
Patient concerned spiriva is making her heart flutter. She has afib with a pacemaker. She was experiencing the flutter before starting the medication, so likely not to be causing this (would be a very rare side effect of the med). Encourage to continue using the spiriva daily, if she still thinks it is not making her breathing better she will schedule a follow up and discuss possible change in treatment.

## 2013-11-26 NOTE — Assessment & Plan Note (Signed)
Patient concerned about developing diabetes given her diet habits. A1c today 6.0, pre-diabetes range. Will continue to monitor and discuss lifestyle changes, gauge her interest in seeing nutritionist.

## 2013-11-26 NOTE — Progress Notes (Signed)
   Subjective:    Patient ID: [name], female    DOB: Apr 16, 1946, 67 y.o.   MRN: [id number]  HPI  CC: heart flutter after taking spiriva  # Heart flutter Recently seen by pharm pulm clinic for PFT, diagnosed with COPD and started on Spiriva. Noticed that she is getting "heart flutters" where she can feel her heart beating faster; these have been occuring since before starting the spiriva but she thinks it may be getting a little more frequent since starting. She cannot describe how frequently it is occuring, but some days about 1-2 times when she is out doing errands. The flutters stop if she stops and rests. Since starting the spiriva she has not noticed her breathing improve, but does say she has not been coughing as much.  She recently was seen at her cardiologist and had her pacemaker interrogated.  # Hypertension - taking medications as directed, no complaints of side effects  # Diet - she is concerned about getting diabetes. Her diet has been with a lot of sweets and sugar. Last a1c checked in 2012.  Review of Systems No CP, no dizziness/lightheadedness, has shortness of breath, no diarrhea/constipation, no rhinorrhea, does have occasional productive cough (yellow sputum). Thinks her legs have swollen over the past few days    Objective:   Physical Exam BP 126/78  Pulse 82  Temp(Src) 98 F (36.7 C) (Oral)  Wt 190 lb (86.183 kg)  SpO2 96%  General: NAD HEENT: PERRL, EOMI CV: RRR, normal heart sounds, no m/r/g Resp: CTAB, effort normal Ext: no edema or cyanosis of the lower legs appreciated. Neuro: Alert, ambulates without difficulty      Assessment & Plan:  See Problem List documentation

## 2013-11-28 ENCOUNTER — Encounter: Payer: Self-pay | Admitting: Family Medicine

## 2013-12-27 ENCOUNTER — Ambulatory Visit (INDEPENDENT_AMBULATORY_CARE_PROVIDER_SITE_OTHER): Payer: Medicare HMO | Admitting: Family Medicine

## 2013-12-27 ENCOUNTER — Encounter: Payer: Self-pay | Admitting: Family Medicine

## 2013-12-27 VITALS — BP 110/72 | HR 71 | Temp 98.9°F | Ht 65.0 in | Wt 186.0 lb

## 2013-12-27 DIAGNOSIS — M25569 Pain in unspecified knee: Secondary | ICD-10-CM | POA: Diagnosis not present

## 2013-12-27 DIAGNOSIS — M109 Gout, unspecified: Secondary | ICD-10-CM

## 2013-12-27 DIAGNOSIS — J449 Chronic obstructive pulmonary disease, unspecified: Secondary | ICD-10-CM

## 2013-12-27 DIAGNOSIS — M25561 Pain in right knee: Secondary | ICD-10-CM

## 2013-12-27 NOTE — Assessment & Plan Note (Signed)
Unclear if current knee pain is a true gout attack; she does say it has felt warm and seems to come and go. Advised to continue to try colchicine, tylenol, ice and elevation. Given some knee rehab exercises. Also gave return precautions for DVT including continued swelling that doesn't go away, increased work of breathing or chest pain.

## 2013-12-27 NOTE — Progress Notes (Signed)
   Subjective:    Patient ID: [name], female    DOB: Apr 04, 1946, 68 y.o.   MRN: [id number]  HPI  CC: Right leg pain/swell  # Right leg pain swelling - started 3-4 weeks ago - pain primarily around knee, behind knee - occurs up to a few times a day and stays swollen for a 2 hours - she has used compression devices that she got from the hospital and cream which seems to help. Has used colchicine once and didn't think it helped - when swelling occurs she feels at times it is warm as well - left leg has also been painful and swollen, though not as much  # Left breast red skin mark - has been present for many years - red and itchy, but not painful - hasn't noticed any changes in color or size  # Home health services - Dr. Verdie Drown previously filled out form for home care services in March 2014, company came out and evaluated her but decided she didn't need the services at the time - Cathlene is going to contact the company again and will bring in any forms that need to be signed - thinks she would like assistance around the house and with transportation  # COPD - has not been using spiriva because she thinks it continues to cause her heart to flutter - no current complaints about breathing, had "flu" a few weeks ago but that has mostly resolved  Review of Systems ROS: no increased work of breathing (COPD at baseline), no CP, no fevers/chills, no nausea/vomiting.     Objective:   Physical Exam BP 110/72  Pulse 71  Temp(Src) 98.9 F (37.2 C) (Oral)  Ht 5\' 5"  (1.651 m)  Wt 186 lb (84.369 kg)  BMI 30.95 kg/m2  General: NAD, pleasant HEENT:PERRL, EOMI. CV: irreg irreg, normal heart sounds Resp: diminished breath sounds bilaterally but sound clear, no increased work of breathing. Ext: right calf circumference 35.5cm, left calf 36cm. Knee ROM full extension and flexion. Right knee with moderately increased effusion and some soft tissue swelling compared to left. Neuro:  strength 5/5 in leg extension/flexion. Skin: left breast with small red superficial lesion located at 11 o'clock approx 15mm x 5mm; normal edges and no increased pigmentation.      Assessment & Plan:  See Problem List documentation

## 2013-12-27 NOTE — Assessment & Plan Note (Signed)
Has not been taking spiriva because she continues to experience heart flutter when she uses it. Her breathing has been okay. Will discuss with Dr. Valentina Lucks about possibly changing to a different medication.

## 2013-12-27 NOTE — Patient Instructions (Signed)
It was nice to see you again.  For your knees: it is possible this is arthritis or gout. Continue to ice the area, elevate it when you are laying down. If you notice the swelling and the area is warm, take the gout medication (Colchicine) and a tylenol. If this is osteoarthritis, one of the best treatments is to do strengthening exercises.  For your home health services: contact the company and if they have any forms for [place] to fill out please call and bring them in.  For your red skin on your breast: we will continue to keep an eye on this. If you notice it changing in color, size, it starts to hurt, we may want to take a biopsy of it to check it for cancer.

## 2014-01-24 ENCOUNTER — Other Ambulatory Visit: Payer: Self-pay | Admitting: Family Medicine

## 2014-02-03 ENCOUNTER — Telehealth: Payer: Self-pay | Admitting: Family Medicine

## 2014-02-03 DIAGNOSIS — J449 Chronic obstructive pulmonary disease, unspecified: Secondary | ICD-10-CM

## 2014-02-03 MED ORDER — BECLOMETHASONE DIPROPIONATE 80 MCG/ACT IN AERS: 2.0000 | INHALATION_SPRAY | Freq: Two times a day (BID) | RESPIRATORY_TRACT | Status: DC

## 2014-02-03 NOTE — Telephone Encounter (Signed)
Called patient to discuss if she was taking her Spiriva for COPD, she says that she has not taken this because it continues to make her heart race. She is willing to try another medication (Qvar) to help with her breathing. She also wanted to schedule a follow up appointment with me, which was setup for Mon 2/23 at 2pm.

## 2014-02-06 ENCOUNTER — Encounter: Payer: Self-pay | Admitting: Family Medicine

## 2014-02-06 ENCOUNTER — Ambulatory Visit (INDEPENDENT_AMBULATORY_CARE_PROVIDER_SITE_OTHER): Payer: Medicare HMO | Admitting: Family Medicine

## 2014-02-06 VITALS — BP 150/88 | HR 69 | Temp 98.1°F | Ht 65.0 in | Wt 188.0 lb

## 2014-02-06 DIAGNOSIS — L299 Pruritus, unspecified: Secondary | ICD-10-CM | POA: Insufficient documentation

## 2014-02-06 DIAGNOSIS — M25561 Pain in right knee: Secondary | ICD-10-CM

## 2014-02-06 DIAGNOSIS — I5032 Chronic diastolic (congestive) heart failure: Secondary | ICD-10-CM

## 2014-02-06 DIAGNOSIS — J449 Chronic obstructive pulmonary disease, unspecified: Secondary | ICD-10-CM

## 2014-02-06 DIAGNOSIS — M25569 Pain in unspecified knee: Secondary | ICD-10-CM

## 2014-02-06 LAB — IRON AND TIBC
%SAT: 17 % — AB (ref 20–55)
Iron: 50 ug/dL (ref 42–145)
TIBC: 292 ug/dL (ref 250–470)
UIBC: 242 ug/dL (ref 125–400)

## 2014-02-06 LAB — FERRITIN: FERRITIN: 639 ng/mL — AB (ref 10–291)

## 2014-02-06 MED ORDER — HYDROCORTISONE 0.5 % EX CREA: 1.0000 "application " | TOPICAL_CREAM | Freq: Two times a day (BID) | CUTANEOUS | Status: DC

## 2014-02-06 NOTE — Progress Notes (Signed)
   Subjective:    Patient ID: [name], female    DOB: 08-26-1946, 68 y.o.   MRN: [id number]  HPI  CC: Iron checked  # Iron: - normally gets iron checked and IV iron at Dr. Nichola Sizer office - she has been feeling a little more weak recently - still takes oral iron supplement  # COPD - picked up the QVAR prescription Friday but hasn't used it yet. - still having SOB with a lot of exertion - also still with cough, at times feels like she can't get any sputum out  # Back itching - has had frequent itching in the middle of her back for the past year - not really painful, hasn't noticed any lumps or bumps - treats it with eczema moisturizer cream (non-steroidal) and also with alcohol soaked cloth - says she has a history of shingles  # Bumps around face, body - have been occuring for years, notices frequently 2-3 times a month - not painful - sometimes pops them and gets white discharge - location varies, can be around mouth, temple/forehead, has had one persistent behind right ear that has changed in size (currently smaller than it has been)   Review of Systems +fatigue, +SOB (with exertion), no CP, no heart flutter    Objective:   Physical Exam BP 150/88  Pulse 69  Temp(Src) 98.1 F (36.7 C) (Oral)  Ht 5\' 5"  (1.651 m)  Wt 188 lb (85.276 kg)  BMI 31.28 kg/m2  General: NAD HEENT: PERRL, EOMI. TMs are pearly gray bilaterally.  CV: Irreg irreg, normal s1/s2, no murmurs Resp: diminished b/s b/l, normal WOB Ext: right and left knees with FROM, only mild swelling present bilaterally and mildly tender to palpation.  Skin: There is an 85mm linear papule on lower left face, similar appearing lesion behind right ear is 8-54mm. Left temple there is a more vesicular appearing lesion about 78mm. In her midback there is a flat 2mm circular area of hyperpigmentation with hypopigmented central area.       Assessment & Plan:  See Problem List documentation

## 2014-02-06 NOTE — Assessment & Plan Note (Signed)
Stable, will get iron panel today.

## 2014-02-06 NOTE — Assessment & Plan Note (Signed)
Still has some continued bilateral knee pain (right > left). This is a longstanding problem that is likely due to osteoarthritis. Imaging done in March 2014 showed mild effusion, though not sure if this is a weight bearing plain film. She says the exercises she was given at last visit have helped some, though she has only done them a few times. P: continue exercises, ice, elevation, tylenol for pain. If worsens will reconsider getting imaging, though probably low utility.

## 2014-02-06 NOTE — Assessment & Plan Note (Signed)
Continued to not use the Spiriva because of heart flutters, prescribed QVAR and she has picked this up but has not started it. P: Start QVAR 97mcg 2 puffs BID.

## 2014-02-06 NOTE — Patient Instructions (Signed)
We will check your iron levels today in the office. Please call Dr. Nichola Sizer office and ask about the IV iron, if they would like the results of our test we would need you to fill out a form saying that we are allowed to fax those results to them.  For your back itching: please STOP using the alcohol, this is drying your skin out. You can continue to use the moisturizing cream, if the itching continues after stopping the alcohol you can also try using an over the counter steroid cream (Hydrocortisone).  For your knee pain: continue doing the exercises I gave you last visit. I would also continue to use the compression stockings, and if needed ice the knee. The best medication to try for the pain is Tylenol, which you can take every 4-6 hours throughout the day.  For COPD: try the QVAR inhaler, 2 puffs in the morning and 2 puffs at night, every day. If you experience any sensation of heart fluttering like you did with Spiriva please call the clinic.

## 2014-02-06 NOTE — Assessment & Plan Note (Signed)
Has >1 year complaint of itching in her mid back. Area looks like there is a single healed shingle scar.  P: continue to monitor, discouraged patient from using alcohol soaked cloth, continue to use moisturizer, to help with itching can try low dose steroid cream.

## 2014-02-07 ENCOUNTER — Telehealth: Payer: Self-pay | Admitting: *Deleted

## 2014-02-07 NOTE — Telephone Encounter (Signed)
ROI signed by patient so that we may send to Dr. Delfin Edis and Dr. Thompson Grayer her lab results and consult reports.  Kelly Eisler, Loralyn [place], [place]

## 2014-02-13 ENCOUNTER — Telehealth: Payer: Self-pay | Admitting: Internal Medicine

## 2014-02-13 ENCOUNTER — Telehealth: Payer: Self-pay | Admitting: Family Medicine

## 2014-02-13 ENCOUNTER — Other Ambulatory Visit: Payer: Self-pay | Admitting: *Deleted

## 2014-02-13 DIAGNOSIS — D509 Iron deficiency anemia, unspecified: Secondary | ICD-10-CM

## 2014-02-13 NOTE — Telephone Encounter (Signed)
Please check CBC with diff

## 2014-02-13 NOTE — Telephone Encounter (Signed)
Pt thinks she needs iron transufion Please advise

## 2014-02-13 NOTE — Telephone Encounter (Signed)
Patient given results and recommendations for lab work today. Patient states she is tired, out of breath with exertion and is bloating after she eats. Patient states she has been given a new inhaler from her PCP that she hopes will help the SOB. She is concerned about the bloating after she eats.

## 2014-02-14 ENCOUNTER — Other Ambulatory Visit (INDEPENDENT_AMBULATORY_CARE_PROVIDER_SITE_OTHER): Payer: 59

## 2014-02-14 DIAGNOSIS — D509 Iron deficiency anemia, unspecified: Secondary | ICD-10-CM

## 2014-02-14 LAB — CBC WITH DIFFERENTIAL/PLATELET
Basophils Absolute: 0 10*3/uL (ref 0.0–0.1)
Basophils Relative: 0.5 % (ref 0.0–3.0)
Eosinophils Absolute: 0.1 10*3/uL (ref 0.0–0.7)
Eosinophils Relative: 3.9 % (ref 0.0–5.0)
HEMATOCRIT: 29.6 % — AB (ref 36.0–46.0)
HEMOGLOBIN: 9.7 g/dL — AB (ref 12.0–15.0)
LYMPHS PCT: 21.9 % (ref 12.0–46.0)
Lymphs Abs: 0.7 10*3/uL (ref 0.7–4.0)
MCHC: 32.7 g/dL (ref 30.0–36.0)
MCV: 89.1 fl (ref 78.0–100.0)
MONOS PCT: 11.5 % (ref 3.0–12.0)
Monocytes Absolute: 0.4 10*3/uL (ref 0.1–1.0)
NEUTROS ABS: 2.1 10*3/uL (ref 1.4–7.7)
Neutrophils Relative %: 62.2 % (ref 43.0–77.0)
Platelets: 227 10*3/uL (ref 150.0–400.0)
RBC: 3.33 Mil/uL — AB (ref 3.87–5.11)
RDW: 15.3 % — ABNORMAL HIGH (ref 11.5–14.6)
WBC: 3.3 10*3/uL — ABNORMAL LOW (ref 4.5–10.5)

## 2014-02-14 NOTE — Telephone Encounter (Signed)
Lab in [facility]. Spoke with patient and she will come for lab.

## 2014-02-15 ENCOUNTER — Other Ambulatory Visit: Payer: Self-pay | Admitting: *Deleted

## 2014-02-15 DIAGNOSIS — D509 Iron deficiency anemia, unspecified: Secondary | ICD-10-CM

## 2014-03-09 ENCOUNTER — Telehealth: Payer: Self-pay | Admitting: *Deleted

## 2014-03-09 ENCOUNTER — Ambulatory Visit (INDEPENDENT_AMBULATORY_CARE_PROVIDER_SITE_OTHER): Payer: 59 | Admitting: Physician Assistant

## 2014-03-09 ENCOUNTER — Encounter: Payer: Self-pay | Admitting: Physician Assistant

## 2014-03-09 ENCOUNTER — Ambulatory Visit (INDEPENDENT_AMBULATORY_CARE_PROVIDER_SITE_OTHER): Payer: 59

## 2014-03-09 VITALS — BP 148/101 | HR 69 | Ht 65.0 in | Wt 185.4 lb

## 2014-03-09 DIAGNOSIS — E785 Hyperlipidemia, unspecified: Secondary | ICD-10-CM

## 2014-03-09 DIAGNOSIS — I4891 Unspecified atrial fibrillation: Secondary | ICD-10-CM

## 2014-03-09 DIAGNOSIS — R002 Palpitations: Secondary | ICD-10-CM

## 2014-03-09 DIAGNOSIS — Z950 Presence of cardiac pacemaker: Secondary | ICD-10-CM

## 2014-03-09 DIAGNOSIS — I442 Atrioventricular block, complete: Secondary | ICD-10-CM

## 2014-03-09 DIAGNOSIS — N183 Chronic kidney disease, stage 3 unspecified: Secondary | ICD-10-CM

## 2014-03-09 DIAGNOSIS — I5032 Chronic diastolic (congestive) heart failure: Secondary | ICD-10-CM

## 2014-03-09 DIAGNOSIS — I10 Essential (primary) hypertension: Secondary | ICD-10-CM

## 2014-03-09 DIAGNOSIS — J449 Chronic obstructive pulmonary disease, unspecified: Secondary | ICD-10-CM

## 2014-03-09 DIAGNOSIS — R0602 Shortness of breath: Secondary | ICD-10-CM

## 2014-03-09 DIAGNOSIS — R079 Chest pain, unspecified: Secondary | ICD-10-CM

## 2014-03-09 LAB — BASIC METABOLIC PANEL
BUN: 22 mg/dL (ref 6–23)
CHLORIDE: 103 meq/L (ref 96–112)
CO2: 31 mEq/L (ref 19–32)
Calcium: 10 mg/dL (ref 8.4–10.5)
Creatinine, Ser: 1.2 mg/dL (ref 0.4–1.2)
GFR: 58.21 mL/min — ABNORMAL LOW (ref >60.00)
GLUCOSE: 105 mg/dL — AB (ref 70–99)
Potassium: 3.7 mEq/L (ref 3.5–5.1)
Sodium: 139 mEq/L (ref 135–145)

## 2014-03-09 LAB — TSH: TSH: 1.06 u[IU]/mL (ref 0.35–5.50)

## 2014-03-09 LAB — BRAIN NATRIURETIC PEPTIDE: PRO B NATRI PEPTIDE: 397 pg/mL — AB (ref 0.0–100.0)

## 2014-03-09 MED ORDER — CARVEDILOL 12.5 MG PO TABS: 12.5000 mg | ORAL_TABLET | Freq: Two times a day (BID) | ORAL | Status: DC

## 2014-03-09 NOTE — Patient Instructions (Signed)
STOP USING THE NITRO PATCH   INCREASE COREG TO 12.5 MG 1 TABLET TWICE DAILY; NEW RX SENT IN TODAY  LAB WORK TODAY BMET, TSH, BNP   PLEASE FOLLOW UP WITH DR. ALLRED IN [place]

## 2014-03-09 NOTE — Progress Notes (Signed)
[address], [place], [place]  [address] Phone: [phone] Fax:  [phone]  Date:  03/09/2014   ID:  [name], DOB 1946/10/24, MRN [id number]  PCP:  Tawanna Sat, MD  Cardiologist:  Dr. Thompson Grayer     History of Present Illness: [name] is a 68 y.o. female with a history of hypertrophic cardiomyopathy, diastolic CHF, complete heart block status post pacemaker, chronic atrial fibrillation, restless leg syndrome, anemia, CKD, HL, sleep apnea, asthma/COPD, depression. She is not on Coumadin secondary to history of gastric AVMs and prior GI bleeding. She is intolerant to statins.  She has a history of chest pain relieved by taking Xanax.  Last seen by Dr. Rayann Heman 11/02/13. Six-month follow up was planned  Her respiratory medications were the recently changed by her PCP secondary to complaints of heart fluttering.  Patient presents to the office today with several complaints. She mainly notes a fluttering in her heart the last few months. She denies exertional symptoms. It may last seconds to minutes. Feels like her heart is racing. She sometimes feels lightheaded. She denies any syncope. She also notes chest pain. She points to her left lower chest underneath her breast. This has been ongoing for the last day. It is intermittent. It is not pleuritic. She denies any symptoms with positional changes or palpation. She denies exertional symptoms. She tells me that she placed a nitroglycerin patch on her shoulder last night to see if it would help her symptoms. She has had no relief. She notes shortness of breath. She's been prescribed several inhalers recently. She has stopped all of these due to palpitations. Her dyspnea is chronic and she describes NYHA class 2b-3 symptoms. She sleeps on 2 pillows chronically. She denies PND. She has mild pedal edema without significant change.  Studies:  - LHC (04/2008):  Normal coronary arteries.  - Echo (05/03/12):  Severe  LVH with mid cavity obliteration during systole, EF 60-65%, moderate LAE, PASP 32.  - Nuclear (08/1999):  No ischemia   Recent Labs: 03/30/2013: ALT 11; HDL Cholesterol 32.80*; LDL (calc) 97  06/25/2013: Creatinine 1.64*; Potassium 4.1; Pro B Natriuretic peptide (BNP) 1498.0*  02/14/2014: Hemoglobin 9.7*   Wt Readings from Last 3 Encounters:  03/09/14 185 lb 6.4 oz (84.097 kg)  02/06/14 188 lb (85.276 kg)  12/27/13 186 lb (84.369 kg)     Past Medical History  Diagnosis Date  . Angioedema     2/2 ACE  . RLS (restless legs syndrome)     Dx 06/2007  . Anemia   . Arteriovenous malformation of stomach   . Permanent atrial fibrillation   . Renal failure     baseline creatinine 1.6  . Hyperlipidemia   . Depression   . Diastolic heart failure   . Arthritis   . Sleep apnea   . Asthma   . Hx of colonic polyp 08/13/10    adenomatous  . Fatty liver 07/26/10  . Panic attacks   . Hypertension   . Complete heart block     s/p PPM 1998  . Hypertrophic cardiomyopathy     dx by Dr [name] 2009  . Hx of colonoscopy   . GI bleed   . Panic attack   . DDD (degenerative disc disease)   . History of alcohol abuse Stopped Fall 2012  . History of tobacco use Quit Fall 2012  . Shortness of breath     sob on exertation  . CHF (congestive heart failure)   .  Angina   . GERD (gastroesophageal reflux disease)   . AVM (arteriovenous malformation) of colon     small intestine; stomach  . Right arm pain 01/08/2012  . Hyperlipidemia   . Blood transfusion     Current Outpatient Prescriptions  Medication Sig Dispense Refill  . ALPRAZolam (XANAX) 0.5 MG tablet Take 1 tablet (0.5 mg total) by mouth as needed for sleep or anxiety.  30 tablet  1  . aspirin 81 MG tablet Take 81 mg by mouth daily.      . Calcium Carb-Cholecalciferol (CALCIUM-VITAMIN D) 600-400 MG-UNIT TABS Take 1 tablet by mouth 2 (two) times daily.  60 tablet  12  . carvedilol (COREG) 6.25 MG tablet Take 1.5 tablets AM and 2 tablet  PM.  105 tablet  6  . colchicine 0.6 MG tablet Take 0.6 mg by mouth daily.      . ferrous sulfate 325 (65 FE) MG tablet TAKE 1 TABLET BY MOUTH TWICE DAILY WITH MEALS TO KEEP BLOOD COUNTS UP  60 tablet  3  . furosemide (LASIX) 20 MG tablet Take 1 tablet (20 mg total) by mouth daily.  90 tablet  3  . Multiple Vitamin (MULITIVITAMIN WITH MINERALS) TABS Take 1 tablet by mouth daily.      [name] NEXIUM 40 MG capsule TAKE 1 CAPSULE BY MOUTH EVERY MORNING BEFORE BREAKFAST  30 capsule  3  . nitroGLYCERIN (NITRODUR - DOSED IN MG/24 HR) 0.1 mg/hr patch Place 0.1 mg onto the skin daily.      . polyvinyl alcohol (LIQUIFILM TEARS) 1.4 % ophthalmic solution Place 1 drop into both eyes 3 (three) times daily as needed (for dry itching eye).      . rosuvastatin (CRESTOR) 20 MG tablet Take 1 tablet (20 mg total) by mouth daily. Take 1 tablet Monday, Wednesday, and Friday.  30 tablet  3  . sucralfate (CARAFATE) 1 G tablet TAKE 1 TABLET BY MOUTH TWICE DAILY  60 tablet  0   Current Facility-Administered Medications  Medication Dose Route Frequency Provider Last Rate Last Dose  . cyanocobalamin ((VITAMIN B-12)) injection 1,000 mcg  1,000 mcg Intramuscular Q30 days [name], MD   1,000 mcg at 09/16/11 1341    Allergies:   Ace inhibitors   Social History:  The patient  reports that she quit smoking about 2 years ago. Her smoking use included Cigarettes. She has a 5 pack-year smoking history. She has never used smokeless tobacco. She reports that she does not drink alcohol or use illicit drugs.   Family History:  The patient's family history includes Alcohol abuse in her brother and father; Cancer in her brother; Colon cancer in her brother; Diabetes in her brother and sister; Heart attack in her brother and father; Heart disease in her mother and sister; Hypertension in her brother, father, mother, and sister; Stroke in her father and mother.   ROS:  Please see the history of present illness.   She has a chronic  cough. She denies melena or hematochezia   All other systems reviewed and negative.   PHYSICAL EXAM: VS:  BP 148/101  Pulse 69  Ht 5\' 5"  (1.651 m)  Wt 185 lb 6.4 oz (84.097 kg)  BMI 30.85 kg/m2 Well nourished, well developed, in no acute distress HEENT: normal Neck: no JVD Cardiac:  normal S1, S2; RRR; no murmur Lungs:  clear to auscultation bilaterally, no wheezing, rhonchi or rales Abd: soft, Diffuse tenderness to palpation, no hepatomegaly Ext: no edema Skin: warm and  dry Neuro:  CNs 2-12 intact, no focal abnormalities noted  EKG:  V paced, HR 69     ASSESSMENT AND PLAN:  1. Palpitations:  The patient's pacemaker was interrogated today. She has had no high ventricular rate episodes. Question if she is experiencing PVCs or PACs. Blood pressure is uncontrolled. I will adjust her Coreg to 12.5 mg twice a day. 2. Chest Pain:  This is atypical for ischemia. No further cardiac work workup is warranted. She describes a significant amount of constipation.  I suspect that she is feeling symptoms from that. She should continue her bowel regimen and follow up with GI if there is no improvement. 3. CHB s/p Pacemaker:  Follow up with EP as planned. 4. Diastolic CHF:  Volume appears stable. She does not look volume overloaded on exam. She does note dyspnea with exertion as well as chest discomfort. I will obtain a basic metabolic panel and BNP today. If her BNP is significantly elevated, consider adjusting Lasix. 5. Atrial Fibrillation:  Rate controlled.  She is not on anticoagulation due to history of GI bleeding. 6. Chronic Kidney Disease:  Check follow up basic metabolic panel. 7. Hypertension:  Uncontrolled. Adjust Coreg as noted. 8. Hyperlipidemia:  Continue statin. 9. Asthma/COPD:  Continue follow up with primary care. 10. Disposition:  Follow up with Dr. Rayann Heman in 3 months.  Signed, Richardson Dopp, PA-C  03/09/2014 12:00 PM

## 2014-03-09 NOTE — Telephone Encounter (Signed)
pt advised of lab results with verbal understanding

## 2014-03-13 LAB — MDC_IDC_ENUM_SESS_TYPE_INCLINIC
Battery Remaining Longevity: 120 mo
Date Time Interrogation Session: 20150326151006
Implantable Pulse Generator Serial Number: 2406848
Lead Channel Impedance Value: 421 Ohm
Lead Channel Pacing Threshold Amplitude: 1 V
Lead Channel Setting Pacing Amplitude: 2.5 V
Lead Channel Setting Pacing Pulse Width: 0.5 ms
Lead Channel Setting Sensing Sensitivity: 8 mV
MDC IDC MSMT BATTERY IMPEDANCE: 1000 Ohm — AB
MDC IDC MSMT BATTERY VOLTAGE: 2.78 V
MDC IDC MSMT LEADCHNL RV PACING THRESHOLD PULSEWIDTH: 0.5 ms
MDC IDC STAT BRADY RV PERCENT PACED: 99 % — AB

## 2014-03-13 NOTE — Progress Notes (Signed)
Pacemaker check in clinic. Normal device function. Threshold and impedance consistent with previous measurements. Device programmed to maximize longevity. Permanent AF + ASA 81mg ---unable to anticoagulate due to GI bleed. No high ventricular rates noted. Device programmed at appropriate safety margins. Histogram distribution appropriate for patient activity level. Device programmed to optimize intrinsic conduction. Estimated longevity >10 years. Patient will follow up with JA on 6-24 @ 4:00pm.

## 2014-03-14 ENCOUNTER — Encounter: Payer: Self-pay | Admitting: Internal Medicine

## 2014-04-04 ENCOUNTER — Ambulatory Visit (INDEPENDENT_AMBULATORY_CARE_PROVIDER_SITE_OTHER): Payer: 59 | Admitting: Home Health Services

## 2014-04-04 ENCOUNTER — Encounter: Payer: Self-pay | Admitting: Home Health Services

## 2014-04-04 VITALS — BP 137/91 | HR 70 | Temp 97.2°F | Ht 65.0 in | Wt 184.0 lb

## 2014-04-04 DIAGNOSIS — Z0000 Encounter for general adult medical examination without abnormal findings: Secondary | ICD-10-CM

## 2014-04-04 NOTE — Progress Notes (Signed)
Patient here for annual wellness visit, patient reports: Risk Factors/Conditions needing evaluation or treatment: Pt reports having some dental pain and also, feels she needs some home health assistance.  Scheduled appointment with PCP to discuss these referrals.  Home Safety: Pt lives by self in 1 story home.  Pt reports having smoke detectors. Other Information: Corrective lens: Pt wears corrective lens for reading.  Pt does not have regular eye exams. Dentures: Pt does not have dentures.  Pt does not have regular eye exams. Memory: Pt reports some memory problems. Patient's Mini Mental Score (recorded in doc. flowsheet): 220- pt has education level of 9th grade, this score may not accurately reflect cognitive ability. BMI/Exercise:  We discussed BMI and strategies for weight loss including portion control and starting a regular exercise routine.  Pt does not currently have an exercise routine. Med Adherence:  We discussed importance of taking all medications daily for htn, cholesterol.  Pt reports 0 missed days in the past week.  ADL/IADL:  Pt reports independence in most functions.  Struggles with home management and transportation. Bladder:  Pt denies any problems with bladder function.  Balance/Gait: Pt reports no falls in the past 12 months.  We dicussed home safety and fall prevention.      Annual Wellness Visit Requirements Recorded Today In  Medical, family, social history Past Medical, Family, Social History Section  Current providers Care team  Current medications Medications  Wt, BP, Ht, BMI Vital signs  Hearing assessment (welcome visit) Hearing/vision  Tobacco, alcohol, illicit drug use History  ADL Nurse Assessment  Depression Screening Nurse Assessment  Cognitive impairment Nurse Assessment  Mini Mental Status Document Flowsheet  Fall Risk Fall/Depression  Home Safety Progress Note  End of Life Planning (welcome visit) Social Documentation  Medicare preventative  services Progress Note  Risk factors/conditions needing evaluation/treatment Progress Note  Personalized health advice Patient Instructions, goals, letter  Diet & Exercise Social Documentation  Emergency Contact Social Documentation  Seat Belts Social Documentation  Sun exposure/protection Social Documentation

## 2014-04-05 ENCOUNTER — Encounter: Payer: Self-pay | Admitting: Internal Medicine

## 2014-04-14 ENCOUNTER — Telehealth: Payer: Self-pay

## 2014-04-14 NOTE — Telephone Encounter (Signed)
Message copied by Algernon Huxley on Fri Apr 14, 2014  9:18 AM ------      Message from: Hulan Saas      Created: Wed Feb 15, 2014  8:31 AM       Call and remind patient due for CBC for DB on 04/17/14. Lab in [facility]. ------

## 2014-04-14 NOTE — Telephone Encounter (Signed)
Pt aware.

## 2014-04-18 ENCOUNTER — Ambulatory Visit: Payer: 59 | Admitting: Family Medicine

## 2014-04-19 ENCOUNTER — Other Ambulatory Visit (INDEPENDENT_AMBULATORY_CARE_PROVIDER_SITE_OTHER): Payer: 59

## 2014-04-19 ENCOUNTER — Other Ambulatory Visit: Payer: Self-pay | Admitting: Family Medicine

## 2014-04-19 DIAGNOSIS — D509 Iron deficiency anemia, unspecified: Secondary | ICD-10-CM

## 2014-04-19 LAB — CBC WITH DIFFERENTIAL/PLATELET
Basophils Absolute: 0 10*3/uL (ref 0.0–0.1)
Basophils Relative: 0.5 % (ref 0.0–3.0)
EOS ABS: 0.1 10*3/uL (ref 0.0–0.7)
EOS PCT: 3 % (ref 0.0–5.0)
HCT: 31.1 % — ABNORMAL LOW (ref 36.0–46.0)
Hemoglobin: 10.2 g/dL — ABNORMAL LOW (ref 12.0–15.0)
LYMPHS PCT: 21.4 % (ref 12.0–46.0)
Lymphs Abs: 0.9 10*3/uL (ref 0.7–4.0)
MCHC: 32.9 g/dL (ref 30.0–36.0)
MCV: 86.9 fl (ref 78.0–100.0)
Monocytes Absolute: 0.5 10*3/uL (ref 0.1–1.0)
Monocytes Relative: 12.5 % — ABNORMAL HIGH (ref 3.0–12.0)
Neutro Abs: 2.5 10*3/uL (ref 1.4–7.7)
Neutrophils Relative %: 62.6 % (ref 43.0–77.0)
PLATELETS: 184 10*3/uL (ref 150.0–400.0)
RBC: 3.58 Mil/uL — ABNORMAL LOW (ref 3.87–5.11)
RDW: 15.3 % (ref 11.5–15.5)
WBC: 4 10*3/uL (ref 4.0–10.5)

## 2014-04-20 ENCOUNTER — Other Ambulatory Visit: Payer: Self-pay | Admitting: *Deleted

## 2014-04-20 DIAGNOSIS — D649 Anemia, unspecified: Secondary | ICD-10-CM

## 2014-04-27 ENCOUNTER — Ambulatory Visit (INDEPENDENT_AMBULATORY_CARE_PROVIDER_SITE_OTHER): Payer: Medicare Other | Admitting: Family Medicine

## 2014-04-27 ENCOUNTER — Encounter: Payer: Self-pay | Admitting: Family Medicine

## 2014-04-27 VITALS — BP 137/77 | HR 70 | Temp 98.2°F | Ht 65.0 in | Wt 187.0 lb

## 2014-04-27 DIAGNOSIS — Z23 Encounter for immunization: Secondary | ICD-10-CM

## 2014-04-27 DIAGNOSIS — E785 Hyperlipidemia, unspecified: Secondary | ICD-10-CM

## 2014-04-27 DIAGNOSIS — Z0000 Encounter for general adult medical examination without abnormal findings: Secondary | ICD-10-CM

## 2014-04-27 DIAGNOSIS — J449 Chronic obstructive pulmonary disease, unspecified: Secondary | ICD-10-CM

## 2014-04-27 MED ORDER — SUCRALFATE 1 G PO TABS: ORAL_TABLET | ORAL | Status: DC

## 2014-04-27 MED ORDER — ALPRAZOLAM 0.5 MG PO TABS: 0.5000 mg | ORAL_TABLET | ORAL | Status: DC | PRN

## 2014-04-27 MED ORDER — ROSUVASTATIN CALCIUM 20 MG PO TABS: 20.0000 mg | ORAL_TABLET | Freq: Every day | ORAL | Status: DC

## 2014-04-27 NOTE — Patient Instructions (Signed)
It was nice to see you again.  For your dental pain: continue taking tylenol. Call your insurance company to find a list of dentists that would be covered by your insurance plan.  For your $15 reward, also call the number on the card you showed me to let them know you had an annual wellness visit this year.  We refilled your prescriptions, when you figure out the stool softener call the office and let us know what it is and we can refill it for you (or you can buy it over the counter)

## 2014-04-30 NOTE — Progress Notes (Signed)
Patient ID: [name], female   DOB: [birth year], 68 y.o.   MRN: [id number]   Subjective:    Patient ID: [name], female    DOB: June 27, 1946, 68 y.o.   MRN: [id number]  HPI  CC: follow up from wellness visit, medication refills  # Wellness visit:  Asking about need for vaccination, brings in sheet of paper from annual wellness visit  Eligible for prevnar 13 vaccine  # COPD:  Not using either of the prescribed inhalers. She says both Spiriva and QVAR make her heart flutter  She thinks she is breathing okay, has not had any worsening  Declines trying other inhalers ROS: denies cough, hemoptysis, fevers/chills, CP, heart palpitations, nausea/vomiting.  Review of Systems   See HPI for ROS. Objective:  BP 137/77  Pulse 70  Temp(Src) 98.2 F (36.8 C) (Oral)  Ht 5\' 5"  (1.651 m)  Wt 187 lb (84.823 kg)  BMI 31.12 kg/m2  SpO2 100%  General: NAD Cardiac: RRR, normal heart sounds, no murmurs. 2+ radial and PT pulses bilaterally Respiratory: CTAB, normal effort Extremities: no edema or cyanosis. WWP. Skin: warm and dry, no rashes noted Neuro: alert and oriented, no focal deficits     Assessment & Plan:  See Problem List Documentation

## 2014-04-30 NOTE — Assessment & Plan Note (Signed)
Prevnar 13 today. Patient now completes pneumococcal schedule 23+13.

## 2014-04-30 NOTE — Assessment & Plan Note (Signed)
Patient says QVAR also caused her heart to flutter and does not wish to start any inhalers today. She seems to be doing well without their use, though given her extensive smoking history she still should be on some controllers. Will re-discuss this at next visit.

## 2014-06-05 ENCOUNTER — Encounter: Payer: Self-pay | Admitting: Home Health Services

## 2014-06-05 NOTE — Progress Notes (Signed)
Patient ID: [name], female   DOB: 09-27-1946, 68 y.o.   MRN: [id number]  I have reviewed this visit and discussed with Lamont Dowdy and agree with her documentation.   Tawanna Sat, MD 06/05/2014, 1:23 PM PGY-1, [facility]

## 2014-06-07 ENCOUNTER — Encounter: Payer: Self-pay | Admitting: Internal Medicine

## 2014-06-07 ENCOUNTER — Ambulatory Visit (INDEPENDENT_AMBULATORY_CARE_PROVIDER_SITE_OTHER): Payer: Medicare Other | Admitting: Internal Medicine

## 2014-06-07 VITALS — BP 128/86 | HR 76 | Ht 65.0 in | Wt 188.0 lb

## 2014-06-07 DIAGNOSIS — Z950 Presence of cardiac pacemaker: Secondary | ICD-10-CM

## 2014-06-07 DIAGNOSIS — I5032 Chronic diastolic (congestive) heart failure: Secondary | ICD-10-CM

## 2014-06-07 DIAGNOSIS — I10 Essential (primary) hypertension: Secondary | ICD-10-CM

## 2014-06-07 DIAGNOSIS — I4891 Unspecified atrial fibrillation: Secondary | ICD-10-CM

## 2014-06-07 DIAGNOSIS — I442 Atrioventricular block, complete: Secondary | ICD-10-CM

## 2014-06-07 LAB — MDC_IDC_ENUM_SESS_TYPE_INCLINIC
Battery Impedance: 1000 Ohm — CL
Battery Voltage: 2.78 V
Date Time Interrogation Session: 20150624165441
Lead Channel Impedance Value: 422 Ohm
Lead Channel Pacing Threshold Amplitude: 1 V
Lead Channel Setting Pacing Pulse Width: 0.5 ms
Lead Channel Setting Sensing Sensitivity: 8 mV
MDC IDC MSMT LEADCHNL RV PACING THRESHOLD PULSEWIDTH: 0.5 ms
MDC IDC PG SERIAL: 2406848
MDC IDC SET LEADCHNL RV PACING AMPLITUDE: 2.5 V
MDC IDC STAT BRADY RV PERCENT PACED: 99 % — AB

## 2014-06-07 MED ORDER — CARVEDILOL 25 MG PO TABS: 25.0000 mg | ORAL_TABLET | Freq: Two times a day (BID) | ORAL | Status: DC

## 2014-06-07 NOTE — Patient Instructions (Signed)
Your physician recommends that you schedule a follow-up appointment in: 4 months with Richardson Dopp, PA and 12 months with Dr Rayann Heman  Your physician has requested that you have an echocardiogram. Echocardiography is a painless test that uses sound waves to create images of your heart. It provides your doctor with information about the size and shape of your heart and how well your heart's chambers and valves are working. This procedure takes approximately one hour. There are no restrictions for this procedure.   Your physician has recommended you make the following change in your medication:  1) Increase Carvedilol to 25mg  twice daily

## 2014-06-10 NOTE — Progress Notes (Signed)
PCP: Tawanna Sat, MD  [name] is a 68 y.o. female who presents today for routine electrophysiology followup.  Since last being seen in our clinic, the patient reports doing reasonably well.  She has occasional palpitations.  Her SOB is stable.  Today, she denies symptoms of palpitations,  lower extremity edema, dizziness, presyncope, or syncope.  The patient is otherwise without complaint today.   Past Medical History  Diagnosis Date  . Angioedema     2/2 ACE  . RLS (restless legs syndrome)     Dx 06/2007  . Anemia   . Arteriovenous malformation of stomach   . Permanent atrial fibrillation   . Renal failure     baseline creatinine 1.6  . Hyperlipidemia   . Depression   . Diastolic heart failure   . Arthritis   . Sleep apnea   . Asthma   . Hx of colonic polyp 08/13/10    adenomatous  . Fatty liver 07/26/10  . Panic attacks   . Hypertension   . Complete heart block     s/p PPM 1998  . Hypertrophic cardiomyopathy     dx by Dr [name] 2009  . Hx of colonoscopy   . GI bleed   . Panic attack   . DDD (degenerative disc disease)   . History of alcohol abuse Stopped Fall 2012  . History of tobacco use Quit Fall 2012  . Shortness of breath     sob on exertation  . CHF (congestive heart failure)   . Angina   . GERD (gastroesophageal reflux disease)   . AVM (arteriovenous malformation) of colon     small intestine; stomach  . Right arm pain 01/08/2012  . Hyperlipidemia   . Blood transfusion    Past Surgical History  Procedure Laterality Date  . Tubal ligation  04/01/2000  . Pacemaker insertion  [place], most recent gen change by [name] 4/12  . Cardiac catheterization    . Esophagogastroduodenoscopy  12/23/2011    Procedure: ESOPHAGOGASTRODUODENOSCOPY (EGD);  Surgeon: [name], MD;  Location: [facility];  Service: Endoscopy;  Laterality: N/A;  . Givens capsule study  12/23/2011    Procedure: GIVENS CAPSULE STUDY;  Surgeon: [name], MD;  Location: [facility]  ENDOSCOPY;  Service: Endoscopy;  Laterality: N/A;    Current Outpatient Prescriptions  Medication Sig Dispense Refill  . albuterol (PROVENTIL HFA;VENTOLIN HFA) 108 (90 BASE) MCG/ACT inhaler Inhale 2 puffs into the lungs every 6 (six) hours as needed for wheezing or shortness of breath.      . ALPRAZolam (XANAX) 0.5 MG tablet Take 1 tablet (0.5 mg total) by mouth as needed for sleep or anxiety.  30 tablet  1  . aspirin EC 81 MG tablet Take 81 mg by mouth daily. Pt states she sometimes takes 1/2 of a 325 mg instead of 81 mg daily      . Calcium Carb-Cholecalciferol (CALCIUM-VITAMIN D) 600-400 MG-UNIT TABS Take 1 tablet by mouth 2 (two) times daily.  60 tablet  12  . carvedilol (COREG) 25 MG tablet Take 1 tablet (25 mg total) by mouth 2 (two) times daily.  60 tablet  11  . colchicine 0.6 MG tablet Take 0.6 mg by mouth as needed.       . ferrous sulfate 325 (65 FE) MG tablet TAKE 1 TABLET BY MOUTH TWICE DAILY WITH MEALS TO KEEP BLOOD COUNTS UP  60 tablet  3  . furosemide (LASIX) 20 MG tablet Take  1 tablet (20 mg total) by mouth daily.  90 tablet  3  . Multiple Vitamin (MULITIVITAMIN WITH MINERALS) TABS Take 1 tablet by mouth daily.      [name] NEXIUM 40 MG capsule TAKE 1 CAPSULE BY MOUTH EVERY MORNING BEFORE BREAKFAST  30 capsule  3  . nitroGLYCERIN (NITROSTAT) 0.4 MG SL tablet Place 0.4 mg under the tongue every 5 (five) minutes as needed for chest pain.      . polyvinyl alcohol (LIQUIFILM TEARS) 1.4 % ophthalmic solution Place 1 drop into both eyes 3 (three) times daily as needed (for dry itching eye).      . rosuvastatin (CRESTOR) 20 MG tablet Take 1 tablet Monday, Wednesday, and Friday. Pt states she takes 1/2 tablet on Tuesday & Thursday (06/07/14)      . sucralfate (CARAFATE) 1 G tablet TAKE 1 TABLET BY MOUTH TWICE DAILY  60 tablet  11   Current Facility-Administered Medications  Medication Dose Route Frequency Provider Last Rate Last Dose  . cyanocobalamin ((VITAMIN B-12)) injection 1,000 mcg   1,000 mcg Intramuscular Q30 days [name], MD   1,000 mcg at 09/16/11 1341   ROS- all systems are reviewed and negative except as per HPI above  Physical Exam: Filed Vitals:   06/07/14 1641  BP: 128/86  Pulse: 76  Height: 5\' 5"  (1.651 m)  Weight: 188 lb (85.276 kg)    GEN- The patient is well appearing, alert and oriented x 3 today.   Head- normocephalic, atraumatic Eyes-  Sclera clear, conjunctiva pink Ears- hearing intact Oropharynx- clear Lungs- Clear to ausculation bilaterally, normal work of breathing Chest- pacemaker pocket is well healed Heart- Regular rate and rhythm, (paced) GI- soft, NT, ND, + BS Extremities- no clubbing, cyanosis, or edema  Pacemaker interrogation- reviewed in detail today,  See PACEART report ekg today reveals afib, V pacing  Assessment and Plan:  1. Complete heart block Normal pacemaker function See Pace Art report  2. Permanent afib Not a candidate for anticoagulation  3. HTN Stable No change required today  4. Atypical chest pain (chronic) No further workup required at this time  5. HL She does not tolerate statins  6. Chronic diastolic dysfunction/ hocm Stable She has chronic SOB.  This is chronic and multifactoral.  I suspect that her chronic lung disease is part of the issue.  She has not had an echo in several years.  I will obtain an echo today. Increase her coreg given HCM and palpitations.  Return to see Nicki Reaper or Cecille Rubin in 4 months I will see in a year

## 2014-06-23 ENCOUNTER — Other Ambulatory Visit: Payer: Self-pay | Admitting: Family Medicine

## 2014-06-28 ENCOUNTER — Ambulatory Visit (HOSPITAL_COMMUNITY): Payer: Medicare Other | Attending: Internal Medicine | Admitting: Radiology

## 2014-06-28 DIAGNOSIS — N189 Chronic kidney disease, unspecified: Secondary | ICD-10-CM | POA: Insufficient documentation

## 2014-06-28 DIAGNOSIS — I509 Heart failure, unspecified: Secondary | ICD-10-CM | POA: Diagnosis not present

## 2014-06-28 DIAGNOSIS — E785 Hyperlipidemia, unspecified: Secondary | ICD-10-CM | POA: Diagnosis not present

## 2014-06-28 DIAGNOSIS — G4733 Obstructive sleep apnea (adult) (pediatric): Secondary | ICD-10-CM | POA: Insufficient documentation

## 2014-06-28 DIAGNOSIS — I129 Hypertensive chronic kidney disease with stage 1 through stage 4 chronic kidney disease, or unspecified chronic kidney disease: Secondary | ICD-10-CM | POA: Diagnosis not present

## 2014-06-28 DIAGNOSIS — I4891 Unspecified atrial fibrillation: Secondary | ICD-10-CM | POA: Insufficient documentation

## 2014-06-28 DIAGNOSIS — Z87891 Personal history of nicotine dependence: Secondary | ICD-10-CM | POA: Insufficient documentation

## 2014-06-28 DIAGNOSIS — I059 Rheumatic mitral valve disease, unspecified: Secondary | ICD-10-CM | POA: Diagnosis not present

## 2014-06-28 DIAGNOSIS — R0602 Shortness of breath: Secondary | ICD-10-CM | POA: Diagnosis not present

## 2014-06-28 DIAGNOSIS — I428 Other cardiomyopathies: Secondary | ICD-10-CM | POA: Diagnosis not present

## 2014-06-28 DIAGNOSIS — I517 Cardiomegaly: Secondary | ICD-10-CM | POA: Insufficient documentation

## 2014-06-28 DIAGNOSIS — I079 Rheumatic tricuspid valve disease, unspecified: Secondary | ICD-10-CM | POA: Diagnosis not present

## 2014-06-28 NOTE — Progress Notes (Signed)
Echocardiogram performed.  

## 2014-07-19 ENCOUNTER — Encounter: Payer: Self-pay | Admitting: Family Medicine

## 2014-07-19 ENCOUNTER — Ambulatory Visit (INDEPENDENT_AMBULATORY_CARE_PROVIDER_SITE_OTHER): Payer: Medicare Other | Admitting: Family Medicine

## 2014-07-19 VITALS — BP 111/65 | HR 76 | Temp 98.1°F | Wt 185.0 lb

## 2014-07-19 DIAGNOSIS — M79672 Pain in left foot: Secondary | ICD-10-CM

## 2014-07-19 DIAGNOSIS — M79609 Pain in unspecified limb: Secondary | ICD-10-CM

## 2014-07-19 NOTE — Progress Notes (Signed)
   Subjective:    Patient ID: [name], female    DOB: 1946-04-08, 68 y.o.   MRN: [id number]  HPI Comments: She comes in today for evaluation of left foot swelling and pain. She reports pain (achy) in the dorsal aspect of her left midfoot beginning Thursday after being physically active cleaning the day before. The next day her left midfoot had some swelling and pain continued to gradually get worse. She thought this could be related to her gout she applied rubbing alcohol to her foot and took aspirin which did not help. She denies any remote or recent trauma/surgeries. She denies any foot erythema or warmth. Denies any numbness or tingling.  She walks barefoot most of the time she is at home.   Review of Systems See HPI     Objective:   Physical Exam  Musculoskeletal:  Left Ankle & Foot: Inspection:  Visible swelling in midfoot w/o ecchymosis or erythema.   Arch: Normal w/o pes cavus or planus but with collapse of midfoot on standing  Palpation: Tenderness on dorsal aspect of midfoot w/o TMT bossing  ROM: Full in plantarflexion, dorsiflexion, inversion, and eversion of the foot; flexion and extension of the toes  Strength: 5/5 in all directions. Sensation: intact Vascular: intact w/ dorsalis pedis & posterior tibialis pulses 2+     Assessment/Plan:      See Problem Focused Assessment & Plan

## 2014-07-19 NOTE — Patient Instructions (Signed)
It was great seeing you today.   1. Your have some collaspe of the arches in your feet. I'm sending you to sport medicine clinic for evaluation and likely custom orthotics.  2. For your pain 1. Take tylenol 500 mg every 6-8 hours as needed 2. Continue to use ice or heat (which every works better for you)    If you have any questions or concerns before then, please call the clinic at [phone].  Take Care,   Dr Phill Myron

## 2014-07-19 NOTE — Assessment & Plan Note (Signed)
Pertinent S&O  Atraumatic left midfoot pain and swelling gradually worsening  Signs of longitudinal arch collapse with standing. No TMT bossing Assessment  Likely due to midfoot collapse. Not consistent with Gout  Pain already improving with rest Plan  Advised tylenol as needed for pain  Referral to Sport Med for eval and possible orthotics  Continue Ice for pain/swelling

## 2014-07-20 ENCOUNTER — Telehealth: Payer: Self-pay | Admitting: *Deleted

## 2014-07-20 NOTE — Telephone Encounter (Signed)
Message copied by Hulan Saas on Thu Jul 20, 2014  9:17 AM ------      Message from: Hulan Saas      Created: Thu Apr 20, 2014  8:35 AM       Call and remind patient due for CBC on 07/24/14 for DB. Lab in [facility]. ------

## 2014-07-20 NOTE — Telephone Encounter (Signed)
Spoke with patient and she will come for labs. 

## 2014-07-24 ENCOUNTER — Other Ambulatory Visit (INDEPENDENT_AMBULATORY_CARE_PROVIDER_SITE_OTHER): Payer: Medicare Other

## 2014-07-24 DIAGNOSIS — D509 Iron deficiency anemia, unspecified: Secondary | ICD-10-CM

## 2014-07-24 DIAGNOSIS — D649 Anemia, unspecified: Secondary | ICD-10-CM

## 2014-07-24 LAB — CBC WITH DIFFERENTIAL/PLATELET
BASOS PCT: 0.6 % (ref 0.0–3.0)
Basophils Absolute: 0 10*3/uL (ref 0.0–0.1)
EOS ABS: 0.1 10*3/uL (ref 0.0–0.7)
Eosinophils Relative: 3.7 % (ref 0.0–5.0)
HEMATOCRIT: 31.7 % — AB (ref 36.0–46.0)
Hemoglobin: 10.4 g/dL — ABNORMAL LOW (ref 12.0–15.0)
LYMPHS ABS: 1.1 10*3/uL (ref 0.7–4.0)
Lymphocytes Relative: 33.1 % (ref 12.0–46.0)
MCHC: 32.8 g/dL (ref 30.0–36.0)
MCV: 86.9 fl (ref 78.0–100.0)
Monocytes Absolute: 0.5 10*3/uL (ref 0.1–1.0)
Monocytes Relative: 15.5 % — ABNORMAL HIGH (ref 3.0–12.0)
NEUTROS ABS: 1.6 10*3/uL (ref 1.4–7.7)
Neutrophils Relative %: 47.1 % (ref 43.0–77.0)
Platelets: 219 10*3/uL (ref 150.0–400.0)
RBC: 3.64 Mil/uL — AB (ref 3.87–5.11)
RDW: 15 % (ref 11.5–15.5)
WBC: 3.3 10*3/uL — ABNORMAL LOW (ref 4.0–10.5)

## 2014-07-24 LAB — IBC PANEL
Iron: 63 ug/dL (ref 42–145)
Saturation Ratios: 19.8 % — ABNORMAL LOW (ref 20.0–50.0)
Transferrin: 226.8 mg/dL (ref 212.0–360.0)

## 2014-08-03 ENCOUNTER — Ambulatory Visit (INDEPENDENT_AMBULATORY_CARE_PROVIDER_SITE_OTHER): Payer: Medicare Other | Admitting: Sports Medicine

## 2014-08-03 ENCOUNTER — Encounter: Payer: Self-pay | Admitting: Sports Medicine

## 2014-08-03 ENCOUNTER — Other Ambulatory Visit: Payer: Self-pay | Admitting: Family Medicine

## 2014-08-03 VITALS — BP 135/86 | HR 71 | Wt 180.0 lb

## 2014-08-03 DIAGNOSIS — M25572 Pain in left ankle and joints of left foot: Secondary | ICD-10-CM

## 2014-08-03 DIAGNOSIS — M79609 Pain in unspecified limb: Secondary | ICD-10-CM

## 2014-08-03 DIAGNOSIS — M79672 Pain in left foot: Secondary | ICD-10-CM

## 2014-08-03 DIAGNOSIS — M25579 Pain in unspecified ankle and joints of unspecified foot: Secondary | ICD-10-CM

## 2014-08-04 ENCOUNTER — Encounter: Payer: Self-pay | Admitting: Sports Medicine

## 2014-08-04 DIAGNOSIS — M25579 Pain in unspecified ankle and joints of unspecified foot: Secondary | ICD-10-CM | POA: Insufficient documentation

## 2014-08-04 NOTE — Progress Notes (Signed)
  [name] - 68 y.o. female MRN [id number]  Date of birth: October 06, 1946  SUBJECTIVE:  Including CC & ROS.  Patient is a obese African American female 68 years old with multiple comorbidities. Presents today for bilateral foot pain particularly over the arch of her left foot. Present for several months. She also reports intermittent swelling in both bilateral lower extremities which are relatively relief when wearing compression stockings and taking her Lasix. She denies any loss of sensation or discoloration of the feet. Describes a general ache across arch particularly when she is doing domestic work or walking for long periods of time. Denies any numbness or tingling or paralysis. Denies any issues or range of motion problems her ankle. She reports that she regularly wears flats with no support in the shoe. Only occasionally wears tennis shoes. Does not tendon particularly where her shoes to provide her much support or comfort.   ROS: Review of systems otherwise negative except for information present in HPI  HISTORY: Past Medical, Surgical, Social, and Family History Reviewed & Updated per EMR. Pertinent Historical Findings include: Prediabetes, COPD, osteoarthritis of the knee, CKD3,  A. fib, hypertension  DATA REVIEWED: X-ray of the left foot from 2013 shows mild arthritis of the tarsal bones and loss of the medial arch, with downward displacement of the cuboid  PHYSICAL EXAM:  VS: BP:135/86 mmHg  HR:71bpm  TEMP: ( )  RESP:   HT:    WT:180 lb (81.647 kg)  BMI:  PHYSICAL EXAM: Patient is an obese African American female in no acute distress. Well-nourished and well-developed. Examination her ankle revealed no signs of ligamentous laxity. Normal range of motion in flexion extension eversion inversion. Examination of her midfoot revealed no signs of swelling erythema. She has a collapsing of her mid arch with standing foot analysis and mild first ray slaying on the left  foot.  ASSESSMENT & PLAN: See problem based charting & AVS for pt instructions.

## 2014-08-04 NOTE — Assessment & Plan Note (Signed)
Eyes patient likely cause of her midfoot pain is for collapsing of her medial arch. She has a pronation of her foot with standing. Recommend obtaining shoes with good arch support. We'll try some green insoles with a scaphoid patch to provide some more support going forward. Advised her to use supportive shoes or tennis shoes with any aggressive activities such as simethicone for patient will work on better shoe support and try these arches support insoles for a month or 2. She is no significant improvement in her pain she can call the office for reevaluation and possibly custom

## 2014-08-22 ENCOUNTER — Other Ambulatory Visit: Payer: Self-pay | Admitting: *Deleted

## 2014-09-18 ENCOUNTER — Ambulatory Visit (INDEPENDENT_AMBULATORY_CARE_PROVIDER_SITE_OTHER): Payer: Medicare Other | Admitting: Family Medicine

## 2014-09-18 ENCOUNTER — Encounter: Payer: Self-pay | Admitting: Family Medicine

## 2014-09-18 VITALS — BP 121/69 | HR 80 | Temp 98.2°F | Wt 186.0 lb

## 2014-09-18 DIAGNOSIS — Z9109 Other allergy status, other than to drugs and biological substances: Secondary | ICD-10-CM | POA: Insufficient documentation

## 2014-09-18 DIAGNOSIS — L309 Dermatitis, unspecified: Secondary | ICD-10-CM | POA: Insufficient documentation

## 2014-09-18 DIAGNOSIS — Z91048 Other nonmedicinal substance allergy status: Secondary | ICD-10-CM

## 2014-09-18 DIAGNOSIS — L299 Pruritus, unspecified: Secondary | ICD-10-CM

## 2014-09-18 DIAGNOSIS — J449 Chronic obstructive pulmonary disease, unspecified: Secondary | ICD-10-CM

## 2014-09-18 MED ORDER — LORATADINE 10 MG PO TABS: 10.0000 mg | ORAL_TABLET | Freq: Every day | ORAL | Status: DC

## 2014-09-18 MED ORDER — TRIAMCINOLONE ACETONIDE 0.1 % EX CREA: 1.0000 "application " | TOPICAL_CREAM | Freq: Two times a day (BID) | CUTANEOUS | Status: DC

## 2014-09-18 NOTE — Progress Notes (Signed)
Patient ID: Jennie Berthelsen, female   DOB: June 03, 1946, 68 y.o.   MRN: [id number]   Subjective:  Nylaya Corprew is a 68 y.o. female here for arm rash .  Rash x 2-3 weeks, itchy in left arm comes and goes. Worst spot is in elbow fold. Tiny bumps that at one point extended up to her left shoulder.   History of back itching. No new detergents, medications, no new lotions or perfumes. Mild runny nose and itchy eyes during this time as well.    Tried rubbing alcohol, creams (triderma OTC and several others), and calamine lotion.  No fevers or redness noted.   SOB: Denies current shortness of breath or chest pain, says it is when she gets anxious. No cough or nasal drainage.   All other pertinent systems reviewed and are negative. Objective:  BP 121/69  Pulse 80  Temp(Src) 98.2 F (36.8 C) (Oral)  Wt 186 lb (84.369 kg)  SpO2 94%  Gen:  68 y.o. female in NAD Resp: Non-labored, CTAB, no wheezes noted CV: Regular rate without murmur or JVD, pacemaker noted.  Skin: AC fossa of left arm with lichenification and mild excoriation. Scattered maculopapular rash on volar forearm and anterior upper arm. No lymphangiitis or edema.  Assessment & Plan:  Genever Fissel is a 68 y.o. female with:  Problem List Items Addressed This Visit     Respiratory   COPD, moderate (Chronic)     No symptoms of exacerbation. Low suspicion for primary cardiorespiratory cause to recent episode of dyspnea alleviated by xanax.      Relevant Medications      loratadine (CLARITIN) tablet 10 mg     Musculoskeletal and Integument   Eczema     With typical symptoms causing itching and induration.  - Tx rash topically - Tx rash, rhinorrhea, and itchy eyes with po antihistamine.     Relevant Medications      TRIAMCINOLONE ACETONIDE 0.1% EX CREA      loratadine (CLARITIN) tablet 10 mg     Other   Itching - Primary   Environmental allergies   Relevant Medications      loratadine (CLARITIN)  tablet 10 mg

## 2014-09-18 NOTE — Patient Instructions (Signed)
-   Please take claritin for allergies (your eyes, nose and skin symptoms).  - Use kenalog cream on your rash on your left arm.  - If you experience any worsening of shortness of breath or fevers please call the clinic at [phone].

## 2014-09-18 NOTE — Assessment & Plan Note (Addendum)
With typical symptoms causing itching and induration.  - Tx rash topically - Tx rash, rhinorrhea, and itchy eyes with po antihistamine.

## 2014-09-18 NOTE — Assessment & Plan Note (Signed)
No symptoms of exacerbation. Low suspicion for primary cardiorespiratory cause to recent episode of dyspnea alleviated by xanax.

## 2014-09-27 ENCOUNTER — Ambulatory Visit (INDEPENDENT_AMBULATORY_CARE_PROVIDER_SITE_OTHER): Payer: Medicare Other | Admitting: Physician Assistant

## 2014-09-27 ENCOUNTER — Encounter: Payer: Self-pay | Admitting: Physician Assistant

## 2014-09-27 VITALS — BP 120/93 | HR 70 | Ht 65.0 in | Wt 185.0 lb

## 2014-09-27 DIAGNOSIS — I442 Atrioventricular block, complete: Secondary | ICD-10-CM

## 2014-09-27 DIAGNOSIS — I482 Chronic atrial fibrillation, unspecified: Secondary | ICD-10-CM

## 2014-09-27 DIAGNOSIS — E785 Hyperlipidemia, unspecified: Secondary | ICD-10-CM

## 2014-09-27 DIAGNOSIS — I10 Essential (primary) hypertension: Secondary | ICD-10-CM

## 2014-09-27 DIAGNOSIS — I429 Cardiomyopathy, unspecified: Secondary | ICD-10-CM

## 2014-09-27 DIAGNOSIS — N183 Chronic kidney disease, stage 3 unspecified: Secondary | ICD-10-CM

## 2014-09-27 DIAGNOSIS — R042 Hemoptysis: Secondary | ICD-10-CM

## 2014-09-27 DIAGNOSIS — R0602 Shortness of breath: Secondary | ICD-10-CM

## 2014-09-27 DIAGNOSIS — Z950 Presence of cardiac pacemaker: Secondary | ICD-10-CM

## 2014-09-27 NOTE — Progress Notes (Signed)
Cardiology Office Note   Date:  09/27/2014   ID:  [name], DOB 1946-04-11, MRN [id number]  PCP:  Tawanna Sat, MD  Cardiologist:  Dr. Thompson Grayer     History of Present Illness: [name] is a 68 y.o. female with a history of hypertrophic cardiomyopathy, diastolic CHF, complete heart block status post pacemaker, chronic atrial fibrillation, restless leg syndrome, anemia, CKD, HL, sleep apnea, asthma/COPD, depression. She is not on Coumadin secondary to history of gastric AVMs and prior GI bleeding. She is intolerant to statins.  She has a history of chest pain relieved by taking Xanax.    Last seen by Dr. Rayann Heman 6/15.  Beta blocker dose was increased. Follow up echocardiogram demonstrated reduced LV function with an EF of 40-45%.  She returns for followup. Patient has multiple symptoms. She continues to have chronic dyspnea. This is overall stable. She is NYHA 2b-3.  She sleeps on 2 pillows chronically. She denies PND. She denies significant pedal edema. She has occasional chest discomfort. This is nonexertional. It is not associated with shortness of breath. She also has some neck and upper back pain with radiation to her left arm. This is also nonexertional and seems to be positional. She denies syncope. She continues to have episodes of heart fluttering. This seems to be worse after eating.   Studies:  - LHC (04/2008):  Normal coronary arteries.  - Echo (05/03/12):  Severe LVH with mid cavity obliteration during systole, EF 60-65%, moderate LAE, PASP 32.  - Echo (7/15):  Severe LVH, EF 40-45%, no RWMA, restrictive physiology, septal dyssynergy, sept-lat and apical-basal dyssynchrony, mild MR, mod to severe LAE, mod reduced RVSF, mod TR, PASP 65 mmHg  - Nuclear (08/1999):  No ischemia   Recent Labs: 03/09/2014: Creatinine 1.2; Potassium 3.7; Pro B Natriuretic peptide (BNP) 397.0*; TSH 1.06  07/24/2014: Hemoglobin 10.4*   Wt Readings from Last 3 Encounters:    09/27/14 185 lb (83.915 kg)  09/18/14 186 lb (84.369 kg)  08/03/14 180 lb (81.647 kg)     Past Medical History  Diagnosis Date  . Angioedema     2/2 ACE  . RLS (restless legs syndrome)     Dx 06/2007  . Anemia   . Arteriovenous malformation of stomach   . Permanent atrial fibrillation   . Renal failure     baseline creatinine 1.6  . Hyperlipidemia   . Depression   . Diastolic heart failure   . Arthritis   . Sleep apnea   . Asthma   . Hx of colonic polyp 08/13/10    adenomatous  . Fatty liver 07/26/10  . Panic attacks   . Hypertension   . Complete heart block     s/p PPM 1998  . Hypertrophic cardiomyopathy     dx by Dr [name] 2009  . Hx of colonoscopy   . GI bleed   . Panic attack   . DDD (degenerative disc disease)   . History of alcohol abuse Stopped Fall 2012  . History of tobacco use Quit Fall 2012  . Shortness of breath     sob on exertation  . CHF (congestive heart failure)   . Angina   . GERD (gastroesophageal reflux disease)   . AVM (arteriovenous malformation) of colon     small intestine; stomach  . Right arm pain 01/08/2012  . Hyperlipidemia   . Blood transfusion     Current Outpatient Prescriptions  Medication Sig Dispense Refill  . albuterol (PROVENTIL HFA;VENTOLIN HFA)  108 (90 BASE) MCG/ACT inhaler Inhale 2 puffs into the lungs every 6 (six) hours as needed for wheezing or shortness of breath.      . ALPRAZolam (XANAX) 0.5 MG tablet Take 1 tablet (0.5 mg total) by mouth as needed for sleep or anxiety.  30 tablet  1  . aspirin EC 81 MG tablet Take 81 mg by mouth daily. Pt states she sometimes takes 1/2 of a 325 mg instead of 81 mg daily      . Calcium Carb-Cholecalciferol (CALCIUM-VITAMIN D) 600-400 MG-UNIT TABS Take 1 tablet by mouth 2 (two) times daily.  60 tablet  12  . carvedilol (COREG) 25 MG tablet Take 1 tablet (25 mg total) by mouth 2 (two) times daily.  60 tablet  11  . colchicine 0.6 MG tablet Take 0.6 mg by mouth as needed.       .  ferrous sulfate 325 (65 FE) MG tablet TAKE 1 TABLET BY MOUTH TWICE DAILY WITH MEALS TO KEEP BLOOD COUNT UP  60 tablet  11  . furosemide (LASIX) 20 MG tablet Take 1 tablet (20 mg total) by mouth daily.  90 tablet  3  . loratadine (CLARITIN) 10 MG tablet Take 1 tablet (10 mg total) by mouth daily.  30 tablet  11  . Multiple Vitamin (MULITIVITAMIN WITH MINERALS) TABS Take 1 tablet by mouth daily.      [name] NEXIUM 40 MG capsule TAKE 1 CAPSULE BY MOUTH EVERY MORNING BEFORE BREAKFAST  30 capsule  3  . nitroGLYCERIN (NITROSTAT) 0.4 MG SL tablet Place 0.4 mg under the tongue every 5 (five) minutes as needed for chest pain.      . polyvinyl alcohol (LIQUIFILM TEARS) 1.4 % ophthalmic solution Place 1 drop into both eyes 3 (three) times daily as needed (for dry itching eye).      . rosuvastatin (CRESTOR) 20 MG tablet Take 20 mg by mouth daily. Take 1 tablet Monday, Wednesday, and Friday. Pt states she takes 1/2 tablet on Tuesday & Thursday (06/07/14)      . sucralfate (CARAFATE) 1 G tablet TAKE 1 TABLET BY MOUTH TWICE DAILY  60 tablet  11  . triamcinolone cream (KENALOG) 0.1 % Apply 1 application topically 2 (two) times daily.  30 g  0   Current Facility-Administered Medications  Medication Dose Route Frequency Provider Last Rate Last Dose  . cyanocobalamin ((VITAMIN B-12)) injection 1,000 mcg  1,000 mcg Intramuscular Q30 days [name], MD   1,000 mcg at 09/16/11 1341    Allergies:   Ace inhibitors   Social History:  The patient  reports that she quit smoking about 3 years ago. Her smoking use included Cigarettes. She has a 5 pack-year smoking history. She has never used smokeless tobacco. She reports that she does not drink alcohol or use illicit drugs.   Family History:  The patient's family history includes Alcohol abuse in her brother and father; Cancer in her brother; Colon cancer in her brother; Diabetes in her brother and sister; Heart attack in her brother and father; Heart disease in her mother  and sister; Hypertension in her brother, father, mother, and sister; Stroke in her father and mother.   ROS:  Please see the history of present illness.    She notes a recent cough with production of blood-tinged sputum.  She denies fever. Her stools are dark from iron therapy. She denies weight loss.   All other systems reviewed and negative.   PHYSICAL EXAM: VS:  BP  120/93  Pulse 70  Ht 5\' 5"  (1.651 m)  Wt 185 lb (83.915 kg)  BMI 30.79 kg/m2 Well nourished, well developed, in no acute distress HEENT: normal Neck: no JVD Cardiac:  normal S1, S2; RRR; no murmur Lungs:  clear to auscultation bilaterally, no wheezing, rhonchi or rales Abd: soft, nontender, no hepatomegaly Ext: no edema Skin: warm and dry Neuro:  CNs 2-12 intact, no focal abnormalities noted  EKG:  V. Paced, HR 70   ASSESSMENT AND PLAN:  1. Cardiomyopathy:  She has a history of hypertrophic cardiomyopathy. Previous ejection fraction in 2013 was normal at 60-65%. Recent echocardiogram in July of this year demonstrates an EF of 40-45%. There is evidence of restrictive physiology and severe LVH. Previous cardiac catheterization in 2009 demonstrated normal coronary arteries. I reviewed the recent echocardiogram findings with Dr. Ron Parker ([facility]). The patient's blood pressure is fairly well controlled. She does have a history of allergy to ACE inhibitor with angioedema. She no longer has LVOT obstruction noted and could possibly be placed on an afterload reducer. However, she has stable symptoms at this time. After thorough review of her chart, we have decided to pursue pharmacologic nuclear study for risk stratification. Further workup will be based upon these results.  She will be brought back in follow up with Dr. Rayann Heman.  2. SOB (shortness of breath):  This is likely multifactorial and related to her cardiomyopathy, diastolic CHF and COPD. It is overall stable. I will check a basic metabolic panel and BNP today.  3. Hemoptysis:   She is a prior smoker and tells me that she recently coughed up blood-tinged sputum. Obtain chest x-ray.  4. HYPERTENSION, BENIGN SYSTEMIC:  Blood pressure overall control.  5. HLD (hyperlipidemia):  Continue statin.  6. Chronic atrial fibrillation:  She is not a Coumadin candidate secondary to history of GI bleeding.  7. Complete heart block s/p PACEMAKER-[place]:  Follow up with EP as planned.  8. CHRONIC KIDNEY DISEASE STAGE III (MODERATE):  Recent creatinine stable.  Disposition: Follow up with Dr. Rayann Heman 4-6 weeks.   Signed, Versie Starks, MHS 09/27/2014 3:50 PM    [facility] [place], [place], [place]  [address] Phone: [phone]; Fax: [phone]

## 2014-09-27 NOTE — Patient Instructions (Addendum)
Your physician recommends that you continue on your current medications as directed. Please refer to the Current Medication list given to you today.  Your physician recommends that you return for lab work in: TODAY, BMET, BNP  Your physician has requested that you have a lexiscan myoview. For further information please visit [email or web address]. Please follow instruction sheet, as given.  Your physician recommends that you schedule a follow-up appointment in: 4-6 [place] [address]. ALLRED  A chest x-ray at [place]

## 2014-09-28 ENCOUNTER — Ambulatory Visit (INDEPENDENT_AMBULATORY_CARE_PROVIDER_SITE_OTHER)
Admission: RE | Admit: 2014-09-28 | Discharge: 2014-09-28 | Disposition: A | Discharge status: Home / Self Care | Payer: Medicare Other | Source: Ambulatory Visit | Attending: Physician Assistant | Admitting: Physician Assistant

## 2014-09-28 DIAGNOSIS — R042 Hemoptysis: Secondary | ICD-10-CM

## 2014-09-28 LAB — BASIC METABOLIC PANEL
BUN: 34 mg/dL — AB (ref 6–23)
CALCIUM: 10 mg/dL (ref 8.4–10.5)
CHLORIDE: 102 meq/L (ref 96–112)
CO2: 28 mEq/L (ref 19–32)
CREATININE: 1.6 mg/dL — AB (ref 0.4–1.2)
GFR: 39.75 mL/min — ABNORMAL LOW (ref >60.00)
Glucose, Bld: 102 mg/dL — ABNORMAL HIGH (ref 70–99)
Potassium: 4.7 mEq/L (ref 3.5–5.1)
Sodium: 140 mEq/L (ref 135–145)

## 2014-09-28 LAB — BRAIN NATRIURETIC PEPTIDE: PRO B NATRI PEPTIDE: 159 pg/mL — AB (ref 0.0–100.0)

## 2014-09-29 ENCOUNTER — Telehealth: Payer: Self-pay | Admitting: *Deleted

## 2014-09-29 ENCOUNTER — Encounter: Payer: Self-pay | Admitting: Internal Medicine

## 2014-09-29 NOTE — Telephone Encounter (Signed)
pt notified about CXR and lab results with verbal understanding to both results

## 2014-10-02 ENCOUNTER — Ambulatory Visit (HOSPITAL_COMMUNITY): Payer: Medicare Other | Attending: Cardiology | Admitting: Radiology

## 2014-10-02 VITALS — BP 124/84 | Ht 65.0 in | Wt 183.0 lb

## 2014-10-02 DIAGNOSIS — I429 Cardiomyopathy, unspecified: Secondary | ICD-10-CM | POA: Diagnosis not present

## 2014-10-02 DIAGNOSIS — I482 Chronic atrial fibrillation: Secondary | ICD-10-CM

## 2014-10-02 DIAGNOSIS — R0602 Shortness of breath: Secondary | ICD-10-CM | POA: Diagnosis not present

## 2014-10-02 MED ORDER — REGADENOSON 0.4 MG/5ML IV SOLN: 0.4000 mg | Freq: Once | INTRAVENOUS | Status: DC

## 2014-10-02 MED ORDER — REGADENOSON 0.4 MG/5ML IV SOLN
0.4000 mg | Freq: Once | INTRAVENOUS | Status: AC
  Administered 2014-10-02: 0.4 mg via INTRAVENOUS

## 2014-10-02 MED ORDER — TECHNETIUM TC 99M SESTAMIBI GENERIC - CARDIOLITE: 33.0000 | Freq: Once | INTRAVENOUS | Status: DC | PRN

## 2014-10-02 MED ORDER — TECHNETIUM TC 99M SESTAMIBI GENERIC - CARDIOLITE
33.0000 | Freq: Once | INTRAVENOUS | Status: AC | PRN
Start: 2014-10-02 — End: 2014-10-02
  Administered 2014-10-02: 33 via INTRAVENOUS

## 2014-10-02 MED ORDER — TECHNETIUM TC 99M SESTAMIBI GENERIC - CARDIOLITE: 11.0000 | Freq: Once | INTRAVENOUS | Status: DC | PRN

## 2014-10-02 MED ORDER — TECHNETIUM TC 99M SESTAMIBI GENERIC - CARDIOLITE
11.0000 | Freq: Once | INTRAVENOUS | Status: AC | PRN
  Administered 2014-10-02: 11 via INTRAVENOUS

## 2014-10-02 NOTE — Progress Notes (Signed)
[facility] [address] [place], [place] [address] [phone]    Cardiology Nuclear Med Study  [name] is a 68 y.o. female     MRN : [id number]     DOB: [birth year]  Procedure Date: 10/02/2014  Nuclear Med Background Indication for Stress Test:  Evaluation for Ischemia History:  Asthma, COPD and CAD, AFIB, '00 MPI: NOT GATED H/O Seizures PTVP Cardiac Risk Factors: Hypertension, Lipids and Atrial Fib  Symptoms:  Chest Pain, DOE, Palpitations and SOB   Nuclear Pre-Procedure Caffeine/Decaff Intake:  None NPO After: 10:00pm   Lungs:  clear O2 Sat: 99% on room air. IV 0.9% NS with Angio Cath:  22g  IV Site: R Hand  IV Started by:  Matilde Haymaker, RN  Chest Size (in):  40 Cup Size: D  Height: 5\' 5"  (1.651 m)  Weight:  183 lb (83.008 kg)  BMI:  Body mass index is 30.45 kg/(m^2). Tech Comments:  Coreg taken at 0700 today    Nuclear Med Study 1 or 2 day study: 1 day  Stress Test Type:  Adenosine  Reading MD: n/a  Order Authorizing Provider:  Trude Mcburney and Chelsea Primus  Resting Radionuclide: Technetium 59m Sestamibi  Resting Radionuclide Dose: 11.0 mCi   Stress Radionuclide:  Technetium 3m Sestamibi  Stress Radionuclide Dose: 33.0 mCi           Stress Protocol Rest HR: 70 Stress HR: 70  Rest BP: 124/84 Stress BP: 158/92  Exercise Time (min): n/a METS: n/a   Predicted Max HR: 150 bpm % Max HR: 46.67 bpm Rate Pressure Product: 11060   Dose of Adenosine (mg):  46.6 Dose of Lexiscan: n/a mg  Dose of Atropine (mg): n/a Dose of Dobutamine: n/a mcg/kg/min (at max HR)  Stress Test Technologist: Perrin Maltese, EMT-P  Nuclear Technologist:  Margie Ege     Rest Procedure:  Myocardial perfusion imaging was performed at rest 45 minutes following the intravenous administration of Technetium 42m Sestamibi. Rest ECG:  Ventricular paced  70 bpm    Stress Procedure:  The patient received IV Lexiscan 0.4 mg over  15-seconds.  Technetium 33m Sestamibi injected at 30-seconds. This patient had head, throat, and chest tightness with the Adenosine infusion. Quantitative spect images were obtained after a 45 minute delay. Stress [id number] due to baseline pacing.    QPS Raw Data Images:  Soft tissue (diaphragm, bowel activity, breast) surround heart.   Stress Images:  Mild thinning with decreased tracer counts in interolateral wall ( base,mid) and small defect at apex.   Rest Images:  Bowel activity more prominent makine comparison difficult.  Inferolateral changes more prominin.  Inferior defect also present (mid, distal)  Apical defect unchanged.   Subtraction (SDS): NO apparent ischemia.   Transient Ischemic Dilatation (Normal <1.22):  1.06 Lung/Heart Ratio (Normal <0.45):  0.27  Quantitative Gated Spect Images QGS EDV:  141 ml QGS ESV:  83 ml  Impression Exercise Capacity:  Lexiscan  No exercise   BP Response:  Normal blood pressure response. Clinical Symptoms:  Mild chest pain/dyspnea. ECG Impression:  Nondiagnostic due to pacing activity.   Comparison with Prior Nuclear Study: Prior scan reported normal though not gated.  No images to compare  Overall Impression:  Small fixed defect in inferolateral region and small apical defect consistent with scar and possible soft tissue attenuation.  No evidence for ischemia.   LV Ejection Fraction: 42%.  LV Wall Motion: Diffuse hypokinesis.    Dorris Carnes

## 2014-10-03 ENCOUNTER — Encounter: Payer: Self-pay | Admitting: Physician Assistant

## 2014-10-04 ENCOUNTER — Telehealth: Payer: Self-pay | Admitting: *Deleted

## 2014-10-04 NOTE — Telephone Encounter (Signed)
pt notified about myoview results with verbal understanding 

## 2014-10-18 ENCOUNTER — Other Ambulatory Visit: Payer: Self-pay | Admitting: Family Medicine

## 2014-11-01 ENCOUNTER — Ambulatory Visit (INDEPENDENT_AMBULATORY_CARE_PROVIDER_SITE_OTHER): Payer: Medicare Other | Admitting: Internal Medicine

## 2014-11-01 ENCOUNTER — Encounter: Payer: Self-pay | Admitting: Internal Medicine

## 2014-11-01 VITALS — BP 120/90 | HR 70 | Ht 65.0 in | Wt 186.8 lb

## 2014-11-01 DIAGNOSIS — I482 Chronic atrial fibrillation, unspecified: Secondary | ICD-10-CM

## 2014-11-01 DIAGNOSIS — Z950 Presence of cardiac pacemaker: Secondary | ICD-10-CM

## 2014-11-01 DIAGNOSIS — I442 Atrioventricular block, complete: Secondary | ICD-10-CM

## 2014-11-01 LAB — MDC_IDC_ENUM_SESS_TYPE_INCLINIC
Brady Statistic RV Percent Paced: 99 %
Implantable Pulse Generator Serial Number: 2406848
Lead Channel Impedance Value: 429 Ohm
Lead Channel Pacing Threshold Amplitude: 1 V
Lead Channel Setting Pacing Amplitude: 2.5 V
Lead Channel Setting Pacing Pulse Width: 0.5 ms
Lead Channel Setting Sensing Sensitivity: 8 mV
MDC IDC MSMT BATTERY IMPEDANCE: 1000 Ohm — AB
MDC IDC MSMT BATTERY REMAINING LONGEVITY: 114 mo
MDC IDC MSMT BATTERY VOLTAGE: 2.78 V
MDC IDC MSMT LEADCHNL RV PACING THRESHOLD PULSEWIDTH: 0.5 ms
MDC IDC SESS DTM: 20151118103848

## 2014-11-01 NOTE — Progress Notes (Signed)
PCP: Tawanna Sat, MD  [name] is a 68 y.o. female who presents today for routine electrophysiology followup.  Since last being seen in our clinic, the patient reports doing reasonably well. Her SOB is stable.  Her concern today is only with L arm musculoskeletal pain.  Today, she denies symptoms of palpitations, CP, lower extremity edema, dizziness, presyncope, or syncope.  The patient is otherwise without complaint today.   Past Medical History  Diagnosis Date  . Angioedema     2/2 ACE  . RLS (restless legs syndrome)     Dx 06/2007  . Anemia   . Arteriovenous malformation of stomach   . Permanent atrial fibrillation   . Renal failure     baseline creatinine 1.6  . Hyperlipidemia   . Depression   . Diastolic heart failure   . Arthritis   . Sleep apnea   . Asthma   . Hx of colonic polyp 08/13/10    adenomatous  . Fatty liver 07/26/10  . Panic attacks   . Hypertension   . Complete heart block     s/p PPM 1998  . Hypertrophic cardiomyopathy     dx by Dr [name] 2009  . Hx of colonoscopy   . GI bleed   . Panic attack   . DDD (degenerative disc disease)   . History of alcohol abuse Stopped Fall 2012  . History of tobacco use Quit Fall 2012  . Shortness of breath     sob on exertation  . CHF (congestive heart failure)   . Angina   . GERD (gastroesophageal reflux disease)   . AVM (arteriovenous malformation) of colon     small intestine; stomach  . Right arm pain 01/08/2012  . Hyperlipidemia   . Blood transfusion   . Hx of cardiovascular stress test     a. Lexiscan Myoview (10/15):  Small inferolateral and apical defect c/w scar and poss soft tissue attenuation, no ischemia, EF 42%   Past Surgical History  Procedure Laterality Date  . Tubal ligation  04/01/2000  . Pacemaker insertion  [place], most recent gen change by [name] 4/12  . Cardiac catheterization    . Esophagogastroduodenoscopy  12/23/2011    Procedure: ESOPHAGOGASTRODUODENOSCOPY (EGD);   Surgeon: [name], MD;  Location: [facility];  Service: Endoscopy;  Laterality: N/A;  . Givens capsule study  12/23/2011    Procedure: GIVENS CAPSULE STUDY;  Surgeon: [name], MD;  Location: [facility];  Service: Endoscopy;  Laterality: N/A;    Current Outpatient Prescriptions  Medication Sig Dispense Refill  . aspirin EC 81 MG tablet Take 81 mg by mouth daily. Pt states she sometimes takes 1/2 of a 325 mg instead of 81 mg daily    . Calcium Carb-Cholecalciferol (CALCIUM-VITAMIN D) 600-400 MG-UNIT TABS Take 1 tablet by mouth 2 (two) times daily. 60 tablet 12  . carvedilol (COREG) 25 MG tablet Take 1 tablet (25 mg total) by mouth 2 (two) times daily. 60 tablet 11  . ferrous sulfate 325 (65 FE) MG tablet TAKE 1 TABLET BY MOUTH TWICE DAILY WITH MEALS TO KEEP BLOOD COUNT UP 60 tablet 11  . furosemide (LASIX) 20 MG tablet Take 1 tablet (20 mg total) by mouth daily. 90 tablet 3  . loratadine (CLARITIN) 10 MG tablet Take 1 tablet (10 mg total) by mouth daily. 30 tablet 11  . Multiple Vitamin (MULITIVITAMIN WITH MINERALS) TABS Take 1 tablet by mouth daily.    [name]  NEXIUM 40 MG capsule TAKE 1 CAPSULE BY MOUTH EVERY MORNING BEFORE BREAKFAST 30 capsule 3  . polyvinyl alcohol (LIQUIFILM TEARS) 1.4 % ophthalmic solution Place 1 drop into both eyes 3 (three) times daily as needed (for dry itching eye).    . rosuvastatin (CRESTOR) 20 MG tablet Take 20 mg by mouth daily. Take 1 tablet Monday, Wednesday, and Friday. Pt states she takes 1/2 tablet on Tuesday & Thursday (06/07/14)    . sucralfate (CARAFATE) 1 G tablet TAKE 1 TABLET BY MOUTH TWICE DAILY 60 tablet 11  . triamcinolone cream (KENALOG) 0.1 % Apply 1 application topically 2 (two) times daily. 30 g 0  . albuterol (PROVENTIL HFA;VENTOLIN HFA) 108 (90 BASE) MCG/ACT inhaler Inhale 2 puffs into the lungs every 6 (six) hours as needed for wheezing or shortness of breath.    . ALPRAZolam (XANAX) 0.5 MG tablet Take 1 tablet (0.5 mg total) by mouth as  needed for sleep or anxiety. 30 tablet 1  . ANUCORT-HC 25 MG suppository UNWRAP AND INSERT 1 SUPPOSITORY RECTALLY AT BEDTIME FOR 10 NIGHTS 10 suppository 0  . colchicine 0.6 MG tablet Take 0.6 mg by mouth as needed.     . nitroGLYCERIN (NITROSTAT) 0.4 MG SL tablet Place 0.4 mg under the tongue every 5 (five) minutes as needed for chest pain.     Current Facility-Administered Medications  Medication Dose Route Frequency Provider Last Rate Last Dose  . cyanocobalamin ((VITAMIN B-12)) injection 1,000 mcg  1,000 mcg Intramuscular Q30 days [name], MD   1,000 mcg at 09/16/11 1341   ROS- all systems are reviewed and negative except as per HPI above  Physical Exam: Filed Vitals:   11/01/14 1014  BP: 120/90  Pulse: 70  Height: 5\' 5"  (1.651 m)  Weight: 186 lb 12.8 oz (84.732 kg)    GEN- The patient is well appearing, alert and oriented x 3 today.   Head- normocephalic, atraumatic Eyes-  Sclera clear, conjunctiva pink Ears- hearing intact Oropharynx- clear Lungs- Clear to ausculation bilaterally, normal work of breathing Chest- pacemaker pocket is well healed Heart- Regular rate and rhythm, (paced) GI- soft, NT, ND, + BS Extremities- no clubbing, cyanosis, or edema  Pacemaker interrogation- reviewed in detail today,  See PACEART report Recent myoview is reviewed with the patient  Assessment and Plan:  1. Complete heart block Normal pacemaker function See Pace Art report  2. Permanent afib Not a candidate for anticoagulation due to prior GI bleeding  3. HTN Stable No change required today  4. Atypical chest pain (chronic) Low risk myoview reviewed with patient  5. HL She does not tolerate statins but seems to be doing well with crestor at this time.  6. Chronic systolic dysfunction/ hocm--> no gradient Stable She has chronic SOB.  This is chronic and multifactoral.  I suspect that her chronic lung disease is part of the issue.   Consider adding ace inhibitor when  she sees [employer] She may benefit from [place] with a general cardiologist   I will see in the EP clinic in a year

## 2014-11-01 NOTE — Patient Instructions (Addendum)
Your physician recommends that you schedule a follow-up appointment in: 3 months with Richardson Dopp, PA    Your physician wants you to follow-up in: 6 months with device clinic and 12 months with Dr. Vallery Ridge will receive a reminder letter in the mail two months in advance. If you don't receive a letter, please call our office to schedule the follow-up appointment.

## 2014-11-07 ENCOUNTER — Encounter: Payer: Self-pay | Admitting: Internal Medicine

## 2014-11-16 ENCOUNTER — Other Ambulatory Visit: Payer: Self-pay | Admitting: Family Medicine

## 2014-11-28 ENCOUNTER — Other Ambulatory Visit: Payer: Self-pay | Admitting: Family Medicine

## 2014-11-29 ENCOUNTER — Encounter: Payer: Self-pay | Admitting: Family Medicine

## 2014-11-29 ENCOUNTER — Ambulatory Visit (INDEPENDENT_AMBULATORY_CARE_PROVIDER_SITE_OTHER): Payer: Medicare Other | Admitting: Family Medicine

## 2014-11-29 VITALS — BP 122/83 | HR 69 | Temp 97.5°F | Ht 65.0 in | Wt 180.6 lb

## 2014-11-29 DIAGNOSIS — M6740 Ganglion, unspecified site: Secondary | ICD-10-CM

## 2014-11-29 MED ORDER — METHYLPREDNISOLONE ACETATE 40 MG/ML IJ SUSP
40.0000 mg | Freq: Once | INTRAMUSCULAR | Status: AC
  Administered 2014-11-29: 40 mg via INTRALESIONAL

## 2014-11-29 NOTE — Progress Notes (Signed)
Patient ID: [name], female   DOB: [birth year], 68 y.o.   MRN: [id number]   [facility] [name], MD Phone: [phone]  Subjective:   [name] is a 68 y.o F who presents with a lesion on her hand. Pt reports that knot has enlarged over the last several weeks and moves when she stretches her hand. Not causing pain but significant pressure in the forehand.   All relevant systems were reviewed and were negative unless otherwise noted in the HPI  Past Medical History Reviewed problem list.  Medications- reviewed and updated Current Outpatient Prescriptions  Medication Sig Dispense Refill  . albuterol (PROVENTIL HFA;VENTOLIN HFA) 108 (90 BASE) MCG/ACT inhaler Inhale 2 puffs into the lungs every 6 (six) hours as needed for wheezing or shortness of breath.    . ALPRAZolam (XANAX) 0.5 MG tablet Take 1 tablet (0.5 mg total) by mouth as needed for sleep or anxiety. 30 tablet 1  . ANUCORT-HC 25 MG suppository UNWRAP AND INSERT 1 SUPPOSITORY RECTALLY AT BEDTIME FOR 10 NIGHTS 10 suppository 0  . aspirin EC 81 MG tablet Take 81 mg by mouth daily. Pt states she sometimes takes 1/2 of a 325 mg instead of 81 mg daily    . Calcium Carb-Cholecalciferol (CALCIUM-VITAMIN D) 600-400 MG-UNIT TABS Take 1 tablet by mouth 2 (two) times daily. 60 tablet 12  . carvedilol (COREG) 25 MG tablet Take 1 tablet (25 mg total) by mouth 2 (two) times daily. 60 tablet 11  . colchicine 0.6 MG tablet Take 0.6 mg by mouth as needed.     . ferrous sulfate 325 (65 FE) MG tablet TAKE 1 TABLET BY MOUTH TWICE DAILY WITH MEALS TO KEEP BLOOD COUNT UP 60 tablet 11  . furosemide (LASIX) 20 MG tablet TAKE 1 TABLET BY MOUTH EVERY DAY 90 tablet 0  . loratadine (CLARITIN) 10 MG tablet Take 1 tablet (10 mg total) by mouth daily. 30 tablet 11  . Multiple Vitamin (MULITIVITAMIN WITH MINERALS) TABS Take 1 tablet by mouth daily.    [name] NEXIUM 40 MG capsule TAKE 1 CAPSULE BY MOUTH EVERY MORNING BEFORE  BREAKFAST 30 capsule 3  . nitroGLYCERIN (NITROSTAT) 0.4 MG SL tablet Place 0.4 mg under the tongue every 5 (five) minutes as needed for chest pain.    . polyvinyl alcohol (LIQUIFILM TEARS) 1.4 % ophthalmic solution Place 1 drop into both eyes 3 (three) times daily as needed (for dry itching eye).    . rosuvastatin (CRESTOR) 20 MG tablet Take 20 mg by mouth daily. Take 1 tablet Monday, Wednesday, and Friday. Pt states she takes 1/2 tablet on Tuesday & Thursday (06/07/14)    . sucralfate (CARAFATE) 1 G tablet TAKE 1 TABLET BY MOUTH TWICE DAILY 60 tablet 11  . triamcinolone cream (KENALOG) 0.1 % APPLY TOPICALLY TWICE DAILY 30 g 0   Current Facility-Administered Medications  Medication Dose Route Frequency Provider Last Rate Last Dose  . cyanocobalamin ((VITAMIN B-12)) injection 1,000 mcg  1,000 mcg Intramuscular Q30 days [name], MD   1,000 mcg at 09/16/11 1341   Chief complaint-noted No additions to family history Social history- patient is a former smoker  Objective: BP 122/83 mmHg  Pulse 69  Temp(Src) 97.5 F (36.4 C) (Oral)  Ht 5\' 5"  (1.651 m)  Wt 180 lb 9.6 oz (81.92 kg)  BMI 30.05 kg/m2 Gen: NAD, alert, cooperative with exam Ext: ganglion cyst that moves with flexion and extension of the hand Neuro: Alert and oriented, no sensory  deficits  Skin: no rashes no lesions  Assessment/Plan:  Procedure:  Injection of ganglion cyst  Consent obtained and verified. Time-out conducted. Noted no overlying erythema, induration, or other signs of local infection. Skin prepped in a sterile fashion. Topical analgesic spray: Ethyl chloride. Completed without difficulty. Aspiration first attempted followed by injection cyst  Meds: depomedrol/lidocaine Pain immediately improved suggesting accurate placement of the medication. Advised to call if fevers/chills, erythema, induration, drainage, or persistent bleeding.  [name], MD Family Medicine PGY-2 Please page or call with  questions

## 2014-11-29 NOTE — Patient Instructions (Signed)
Joint Injection HOME CARE INSTRUCTIONS  Limit yourself to light activity the day of your procedure. Avoid lifting heavy objects, bending, stooping, or twisting.  Take prescription or over-the-counter pain medication as directed by your caregiver.  You may apply ice to your injection site to reduce pain and swelling the day of your procedure. Ice may be applied 03-04 times:  Put ice in a plastic bag.  Place a towel between your skin and the bag.  Leave the ice on for no longer than 15-20 minutes each time. SEEK IMMEDIATE MEDICAL CARE IF:   Pain and swelling get worse rather than better or extend beyond the injection site.  Numbness does not go away.  Blood or fluid continues to leak from the injection site.  You have chest pain.  You have swelling of your face or tongue.  You have trouble breathing or you become dizzy.  You develop a fever, chills, or severe tenderness at the injection site that last longer than 1 day. MAKE SURE YOU:  Understand these instructions.  Watch your condition.  Get help right away if you are not doing well or if you get worse. Document Released: 08/14/2011 Document Revised: 02/23/2012 Document Reviewed: 08/14/2011 [facility] Patient Information 2015 [place], [place]. This information is not intended to replace advice given to you by your health care provider. Make sure you discuss any questions you have with your health care provider.

## 2014-12-26 ENCOUNTER — Encounter: Payer: Self-pay | Admitting: Family Medicine

## 2014-12-26 ENCOUNTER — Ambulatory Visit
Admission: RE | Admit: 2014-12-26 | Discharge: 2014-12-26 | Disposition: A | Discharge status: Home / Self Care | Payer: Medicare Other | Source: Ambulatory Visit | Attending: Family Medicine | Admitting: Family Medicine

## 2014-12-26 ENCOUNTER — Ambulatory Visit (INDEPENDENT_AMBULATORY_CARE_PROVIDER_SITE_OTHER): Payer: Medicare Other | Admitting: Family Medicine

## 2014-12-26 ENCOUNTER — Other Ambulatory Visit: Payer: Self-pay | Admitting: Family Medicine

## 2014-12-26 VITALS — BP 139/88 | HR 70 | Temp 98.0°F | Ht 65.0 in | Wt 181.2 lb

## 2014-12-26 DIAGNOSIS — Z1159 Encounter for screening for other viral diseases: Secondary | ICD-10-CM

## 2014-12-26 DIAGNOSIS — Z0000 Encounter for general adult medical examination without abnormal findings: Secondary | ICD-10-CM | POA: Diagnosis not present

## 2014-12-26 DIAGNOSIS — N179 Acute kidney failure, unspecified: Secondary | ICD-10-CM | POA: Diagnosis not present

## 2014-12-26 DIAGNOSIS — R109 Unspecified abdominal pain: Secondary | ICD-10-CM

## 2014-12-26 DIAGNOSIS — R350 Frequency of micturition: Secondary | ICD-10-CM

## 2014-12-26 DIAGNOSIS — D509 Iron deficiency anemia, unspecified: Secondary | ICD-10-CM

## 2014-12-26 DIAGNOSIS — M25512 Pain in left shoulder: Secondary | ICD-10-CM

## 2014-12-26 DIAGNOSIS — M160 Bilateral primary osteoarthritis of hip: Secondary | ICD-10-CM | POA: Diagnosis not present

## 2014-12-26 DIAGNOSIS — R10A1 Flank pain, right side: Secondary | ICD-10-CM

## 2014-12-26 DIAGNOSIS — N189 Chronic kidney disease, unspecified: Secondary | ICD-10-CM | POA: Diagnosis not present

## 2014-12-26 LAB — COMPREHENSIVE METABOLIC PANEL
ALK PHOS: 39 U/L (ref 39–117)
ALT: 14 U/L (ref 0–35)
AST: 19 U/L (ref 0–37)
Albumin: 4 g/dL (ref 3.5–5.2)
BILIRUBIN TOTAL: 0.6 mg/dL (ref 0.2–1.2)
BUN: 22 mg/dL (ref 6–23)
CO2: 28 mEq/L (ref 19–32)
CREATININE: 1.13 mg/dL — AB (ref 0.50–1.10)
Calcium: 9.7 mg/dL (ref 8.4–10.5)
Chloride: 101 mEq/L (ref 96–112)
GLUCOSE: 106 mg/dL — AB (ref 70–99)
Potassium: 4.1 mEq/L (ref 3.5–5.3)
Sodium: 139 mEq/L (ref 135–145)
TOTAL PROTEIN: 6.7 g/dL (ref 6.0–8.3)

## 2014-12-26 LAB — CBC
HEMATOCRIT: 33.4 % — AB (ref 36.0–46.0)
HEMOGLOBIN: 10.6 g/dL — AB (ref 12.0–15.0)
MCH: 28.6 pg (ref 26.0–34.0)
MCHC: 31.7 g/dL (ref 30.0–36.0)
MCV: 90 fL (ref 78.0–100.0)
MPV: 10 fL (ref 8.6–12.4)
Platelets: 204 10*3/uL (ref 150–400)
RBC: 3.71 MIL/uL — AB (ref 3.87–5.11)
RDW: 14 % (ref 11.5–15.5)
WBC: 3.8 10*3/uL — AB (ref 4.0–10.5)

## 2014-12-26 LAB — POCT URINALYSIS DIPSTICK
Bilirubin, UA: NEGATIVE
GLUCOSE UA: NEGATIVE
Ketones, UA: NEGATIVE
NITRITE UA: NEGATIVE
PH UA: 6.5
Protein, UA: 100
RBC UA: NEGATIVE
SPEC GRAV UA: 1.015
UROBILINOGEN UA: 1

## 2014-12-26 LAB — POCT UA - MICROSCOPIC ONLY

## 2014-12-26 NOTE — Progress Notes (Signed)
Subjective:    [name] is a 69 y.o. female who presents to [facility] today for several issues:  1.  Right flank pain:  Present for about a month.  Does endorse some increased urinary frequency for past several days; however she takes Lasix and is unsure if that is cause of frequency.  Nocturia x 3 most nights, but sounds like this is chronic.  Has not been drinking much water recently, no particular reason why.  She tried Acetaminophen recently x 1 but felt lightheaded afterwards so she hasn't tried this or anything else for analgesia.  No falls or trauma.   2.  Left shoulder pain:  Also present for about 1 month. No inciting trigger or trauma.  Describes "dull ache in my bones" mostly in shoulder and humerus.  Non-radiating.  Limits moving her arms above her head.  See above for analgesics.     3.  Wants to be screened for hepatitis.  She is within age range recommended by CDC.  4.  Constipation:  She currently has a bowel movement about once a week.  This is resolved with either prune juice or laxatives.  Does not use these daily.  No blood in stool.   ROS as above per HPI, otherwise neg.  Pertinently, no chest pain, palpitations, SOB, Fever, Chills, Abd pain, N/V/D.   The following portions of the patient's history were reviewed and updated as appropriate: allergies, current medications, past medical history, family and social history, and problem list. Patient is a nonsmoker.    PMH reviewed.  Past Medical History  Diagnosis Date  . Angioedema     2/2 ACE  . RLS (restless legs syndrome)     Dx 06/2007  . Anemia   . Arteriovenous malformation of stomach   . Permanent atrial fibrillation   . Renal failure     baseline creatinine 1.6  . Hyperlipidemia   . Depression   . Diastolic heart failure   . Arthritis   . Sleep apnea   . Asthma   . Hx of colonic polyp 08/13/10    adenomatous  . Fatty liver 07/26/10  . Panic attacks   . Hypertension   . Complete heart block      s/p PPM 1998  . Hypertrophic cardiomyopathy     dx by Dr [name] 2009  . Hx of colonoscopy   . GI bleed   . Panic attack   . DDD (degenerative disc disease)   . History of alcohol abuse Stopped Fall 2012  . History of tobacco use Quit Fall 2012  . Shortness of breath     sob on exertation  . CHF (congestive heart failure)   . Angina   . GERD (gastroesophageal reflux disease)   . AVM (arteriovenous malformation) of colon     small intestine; stomach  . Right arm pain 01/08/2012  . Hyperlipidemia   . Blood transfusion   . Hx of cardiovascular stress test     a. Lexiscan Myoview (10/15):  Small inferolateral and apical defect c/w scar and poss soft tissue attenuation, no ischemia, EF 42%   Past Surgical History  Procedure Laterality Date  . Tubal ligation  04/01/2000  . Pacemaker insertion  [place], most recent gen change by [name] 4/12  . Cardiac catheterization    . Esophagogastroduodenoscopy  12/23/2011    Procedure: ESOPHAGOGASTRODUODENOSCOPY (EGD);  Surgeon: [name], MD;  Location: [facility];  Service: Endoscopy;  Laterality: N/A;  .  Givens capsule study  12/23/2011    Procedure: GIVENS CAPSULE STUDY;  Surgeon: [name], MD;  Location: [facility];  Service: Endoscopy;  Laterality: N/A;    Medications reviewed. Current Outpatient Prescriptions  Medication Sig Dispense Refill  . albuterol (PROVENTIL HFA;VENTOLIN HFA) 108 (90 BASE) MCG/ACT inhaler Inhale 2 puffs into the lungs every 6 (six) hours as needed for wheezing or shortness of breath.    . ALPRAZolam (XANAX) 0.5 MG tablet Take 1 tablet (0.5 mg total) by mouth as needed for sleep or anxiety. 30 tablet 1  . ANUCORT-HC 25 MG suppository UNWRAP AND INSERT 1 SUPPOSITORY RECTALLY AT BEDTIME FOR 10 NIGHTS 10 suppository 0  . aspirin EC 81 MG tablet Take 81 mg by mouth daily. Pt states she sometimes takes 1/2 of a 325 mg instead of 81 mg daily    . Calcium Carb-Cholecalciferol (CALCIUM-VITAMIN D) 600-400 MG-UNIT  TABS Take 1 tablet by mouth 2 (two) times daily. 60 tablet 12  . carvedilol (COREG) 25 MG tablet Take 1 tablet (25 mg total) by mouth 2 (two) times daily. 60 tablet 11  . colchicine 0.6 MG tablet Take 0.6 mg by mouth as needed.     . ferrous sulfate 325 (65 FE) MG tablet TAKE 1 TABLET BY MOUTH TWICE DAILY WITH MEALS TO KEEP BLOOD COUNT UP 60 tablet 11  . furosemide (LASIX) 20 MG tablet TAKE 1 TABLET BY MOUTH EVERY DAY 90 tablet 0  . loratadine (CLARITIN) 10 MG tablet Take 1 tablet (10 mg total) by mouth daily. 30 tablet 11  . Multiple Vitamin (MULITIVITAMIN WITH MINERALS) TABS Take 1 tablet by mouth daily.    [name] NEXIUM 40 MG capsule TAKE 1 CAPSULE BY MOUTH EVERY MORNING BEFORE BREAKFAST 30 capsule 3  . nitroGLYCERIN (NITROSTAT) 0.4 MG SL tablet Place 0.4 mg under the tongue every 5 (five) minutes as needed for chest pain.    . polyvinyl alcohol (LIQUIFILM TEARS) 1.4 % ophthalmic solution Place 1 drop into both eyes 3 (three) times daily as needed (for dry itching eye).    . rosuvastatin (CRESTOR) 20 MG tablet Take 20 mg by mouth daily. Take 1 tablet Monday, Wednesday, and Friday. Pt states she takes 1/2 tablet on Tuesday & Thursday (06/07/14)    . sucralfate (CARAFATE) 1 G tablet TAKE 1 TABLET BY MOUTH TWICE DAILY 60 tablet 11  . triamcinolone cream (KENALOG) 0.1 % APPLY TOPICALLY TWICE DAILY 30 g 0   Current Facility-Administered Medications  Medication Dose Route Frequency Provider Last Rate Last Dose  . cyanocobalamin ((VITAMIN B-12)) injection 1,000 mcg  1,000 mcg Intramuscular Q30 days [name], MD   1,000 mcg at 09/16/11 1341     Objective:   Physical Exam BP 139/88 mmHg  Pulse 70  Temp(Src) 98 F (36.7 C) (Oral)  Ht 5\' 5"  (1.651 m)  Wt 181 lb 3.2 oz (82.192 kg)  BMI 30.15 kg/m2 Gen:  Alert, cooperative patient who appears stated age in no acute distress.  Vital signs reviewed. HEENT: EOMI,  MMM Cardiac:  Regular rate and rhythm  Pulm:  Clear to auscultation bilaterally .    Abd:  Soft/nondistended.  Minimally tender Right flank and RLQ.  No CVA tenderness.  No guarding or rebound.  Does feel like she has stool burden palpable.   Exts: No edema BL.  No pain with internal/external rotation of hips.  MSK:  Left shoulder TTP diffusely.  Feels pain with most movements/testing of shoulder.  Also tender along humerus to  deep palpation along upper third.  No bruising or skin changes evident, no edema.  Mild lumbar back pain BL. Neuro:  Walks without limp.  Sensation intact BL LE's  No results found for this or any previous visit (from the past 72 hour(s)).

## 2014-12-26 NOTE — Patient Instructions (Signed)
Checking blood work today.  Sending you for an xray of hip and Left shoulder.   Take the Tylenol for relief.

## 2014-12-27 ENCOUNTER — Telehealth: Payer: Self-pay | Admitting: Family Medicine

## 2014-12-27 DIAGNOSIS — M25512 Pain in left shoulder: Secondary | ICD-10-CM | POA: Insufficient documentation

## 2014-12-27 DIAGNOSIS — R10A1 Flank pain, right side: Secondary | ICD-10-CM | POA: Insufficient documentation

## 2014-12-27 DIAGNOSIS — R109 Unspecified abdominal pain: Secondary | ICD-10-CM | POA: Insufficient documentation

## 2014-12-27 LAB — HEPATITIS PANEL, ACUTE
HCV AB: NEGATIVE
HEP A IGM: NONREACTIVE
HEP B C IGM: NONREACTIVE
Hepatitis B Surface Ag: NEGATIVE

## 2014-12-27 NOTE — Assessment & Plan Note (Signed)
Present x 1 month. Not likely nephrolithiasis based on presentation, no microscopic hematuria.   See AKI problem above -- though not likely related to kidneys at all. Discussed possibility of referred pain from hip as it radiates to buttocks at times. Obtainnig pelvic xrays today. More likely pain from her back vs abdominal strain. Will call with results.  She is to re-attempt tylenol and see if she becomes lightheaded again or has relief.

## 2014-12-27 NOTE — Telephone Encounter (Signed)
Called and discussed normal lab results (she has chronic anemia, and CKD is better than prior).  Also normal radiographs.  Patient appreciative of call.  Discussed likely abdominal wall strain causing pain in Right side, she is to continue staying active so this doesn't tighten up.  If not improved in 3-4 weeks to return to clinic.  She expressed understanding.

## 2014-12-27 NOTE — Assessment & Plan Note (Signed)
Has had this fairly recently in past.  Seems to be chronically Stage III.   Protein in urine today -- checking creatinine to assure no worsening.

## 2014-12-27 NOTE — Assessment & Plan Note (Signed)
Screening for hepatitis today based on patient request and age cohort

## 2014-12-27 NOTE — Assessment & Plan Note (Signed)
Diffuse pain, testing is equivocal throughout.   Obtaining radiographs of shoulder though likely low-yield -- she is going for radiographs of hip.   More likely this is rotator cuff pathology based on underuse.  May benefit from ultrasound in future of shoulder. To continue to remain as mobile as possible.  She demonstrated some exercises she is trying at home -- to continue these.

## 2015-01-18 ENCOUNTER — Telehealth: Payer: Self-pay | Admitting: Internal Medicine

## 2015-01-18 ENCOUNTER — Encounter: Payer: Self-pay | Admitting: Physician Assistant

## 2015-01-18 ENCOUNTER — Ambulatory Visit (INDEPENDENT_AMBULATORY_CARE_PROVIDER_SITE_OTHER): Payer: Medicare Other | Admitting: Physician Assistant

## 2015-01-18 VITALS — BP 134/88 | HR 91 | Ht 65.0 in | Wt 184.2 lb

## 2015-01-18 DIAGNOSIS — I4891 Unspecified atrial fibrillation: Secondary | ICD-10-CM

## 2015-01-18 DIAGNOSIS — I5032 Chronic diastolic (congestive) heart failure: Secondary | ICD-10-CM | POA: Diagnosis not present

## 2015-01-18 DIAGNOSIS — R0602 Shortness of breath: Secondary | ICD-10-CM

## 2015-01-18 DIAGNOSIS — R0600 Dyspnea, unspecified: Secondary | ICD-10-CM | POA: Diagnosis not present

## 2015-01-18 LAB — BRAIN NATRIURETIC PEPTIDE: PRO B NATRI PEPTIDE: 537 pg/mL — AB (ref 0.0–100.0)

## 2015-01-18 LAB — BASIC METABOLIC PANEL
BUN: 21 mg/dL (ref 6–23)
CHLORIDE: 101 meq/L (ref 96–112)
CO2: 32 mEq/L (ref 19–32)
Calcium: 10.3 mg/dL (ref 8.4–10.5)
Creatinine, Ser: 1.25 mg/dL — ABNORMAL HIGH (ref 0.40–1.20)
GFR: 54.33 mL/min — ABNORMAL LOW (ref >60.00)
Glucose, Bld: 98 mg/dL (ref 70–99)
Potassium: 3.6 mEq/L (ref 3.5–5.1)
SODIUM: 139 meq/L (ref 135–145)

## 2015-01-18 LAB — CBC
HCT: 29.8 % — ABNORMAL LOW (ref 36.0–46.0)
Hemoglobin: 10.1 g/dL — ABNORMAL LOW (ref 12.0–15.0)
MCHC: 33.8 g/dL (ref 30.0–36.0)
MCV: 85.8 fl (ref 78.0–100.0)
Platelets: 222 10*3/uL (ref 150.0–400.0)
RBC: 3.48 Mil/uL — AB (ref 3.87–5.11)
RDW: 14.1 % (ref 11.5–15.5)
WBC: 4 10*3/uL (ref 4.0–10.5)

## 2015-01-18 MED ORDER — NITROGLYCERIN 0.4 MG SL SUBL: 0.4000 mg | SUBLINGUAL_TABLET | SUBLINGUAL | Status: DC | PRN

## 2015-01-18 MED ORDER — FUROSEMIDE 20 MG PO TABS: 20.0000 mg | ORAL_TABLET | Freq: Every day | ORAL | Status: DC

## 2015-01-18 NOTE — Progress Notes (Signed)
Cardiology Office Note   Date:  01/18/2015   ID:  [name], DOB Sep 23, 1946, MRN [id number]  PCP:  Tawanna Sat, MD  Cardiologist:  Dr. Rayann Heman   Chief Complaint  Patient presents with  . Shortness of Breath    nausea      History of Present Illness: [name] is a 69 y.o. female with a history of  hypertrophic cardiomyopathy, chronic systolic/diastolic CHF (EF [id number]), complete heart block s/p pacemaker, chronic atrial fibrillation, restless leg syndrome, anemia, CKD, HL, sleep apnea, asthma/COPD, depression who was added on to my office schedule today for worsening SOB.   She has a hx of afib but is not on Coumadin secondary to history of gastric AVMs and prior GI bleeding. She is intolerant to statins but has been on crestor and tolerating this well.She has a history of chest pain relieved by taking Xanax.A 2D ECHO on 06/2014 demonstrated reduced LV function with an EF of 40-45%. She was seen back for follow up by Richardson Dopp PA-C in 09/2014. She complained of multiple symptoms including chronic dyspnea; NYHA 2b-3.A nuclear stress test was ordered at this time which revealed a small fixed defect in inferolateral region and small apical defect consistent with scar and possible soft tissue attenuation. No evidence for ischemia. LV Ejection Fraction: 42%. LV Wall Motion: Diffuse hypokinesis. No further work up was recommended.  Today she is added onto my schedule for worsening SOB starting last night. She describes multiple episodes of needing to sit up on the side of the bed c/w PND. She sleeps on 2 pillows chronically. She denies significant pedal edema. She has occasional chest discomfort. This is nonexertional. It is not associated with shortness of breath. She denies syncope. She continues to have episodes of heart fluttering. This seems to be worse after eating. She also has multiple complaints today including left shoulder pain, right flank pain, and slight  nausea after meals. She was seen by her PCP last week for evaluation of shoulder and back pain. She had imaging that revealed no acute abnormalities. She takes all of her medications as prescribed.  She has not been as strict with salt lately or watching her fluid intake.    Past Medical History  Diagnosis Date  . Angioedema     2/2 ACE  . RLS (restless legs syndrome)     Dx 06/2007  . Anemia   . Arteriovenous malformation of stomach   . Permanent atrial fibrillation   . Renal failure     baseline creatinine 1.6  . Hyperlipidemia   . Depression   . Diastolic heart failure   . Arthritis   . Sleep apnea   . Asthma   . Hx of colonic polyp 08/13/10    adenomatous  . Fatty liver 07/26/10  . Panic attacks   . Hypertension   . Complete heart block     s/p PPM 1998  . Hypertrophic cardiomyopathy     dx by Dr [name] 2009  . Hx of colonoscopy   . GI bleed   . Panic attack   . DDD (degenerative disc disease)   . History of alcohol abuse Stopped Fall 2012  . History of tobacco use Quit Fall 2012  . Shortness of breath     sob on exertation  . CHF (congestive heart failure)   . Angina   . GERD (gastroesophageal reflux disease)   . AVM (arteriovenous malformation) of colon     small intestine; stomach  . Right  arm pain 01/08/2012  . Hyperlipidemia   . Blood transfusion   . Hx of cardiovascular stress test     a. Lexiscan Myoview (10/15):  Small inferolateral and apical defect c/w scar and poss soft tissue attenuation, no ischemia, EF 42%    Past Surgical History  Procedure Laterality Date  . Tubal ligation  04/01/2000  . Pacemaker insertion  [place], most recent gen change by [name] 4/12  . Cardiac catheterization    . Esophagogastroduodenoscopy  12/23/2011    Procedure: ESOPHAGOGASTRODUODENOSCOPY (EGD);  Surgeon: [name], MD;  Location: [facility];  Service: Endoscopy;  Laterality: N/A;  . Givens capsule study  12/23/2011    Procedure: GIVENS CAPSULE STUDY;  Surgeon:  [name], MD;  Location: [facility];  Service: Endoscopy;  Laterality: N/A;     Current Outpatient Prescriptions  Medication Sig Dispense Refill  . ALPRAZolam (XANAX) 0.5 MG tablet Take 1 tablet (0.5 mg total) by mouth as needed for sleep or anxiety. 30 tablet 1  . ANUCORT-HC 25 MG suppository UNWRAP AND INSERT 1 SUPPOSITORY RECTALLY AT BEDTIME FOR 10 NIGHTS 10 suppository 0  . aspirin EC 81 MG tablet Take 81 mg by mouth daily. Pt states she sometimes takes 1/2 of a 325 mg instead of 81 mg daily    . Calcium Carb-Cholecalciferol (CALCIUM-VITAMIN D) 600-400 MG-UNIT TABS Take 1 tablet by mouth 2 (two) times daily. 60 tablet 12  . carvedilol (COREG) 25 MG tablet Take 1 tablet (25 mg total) by mouth 2 (two) times daily. 60 tablet 11  . ferrous sulfate 325 (65 FE) MG tablet TAKE 1 TABLET BY MOUTH TWICE DAILY WITH MEALS TO KEEP BLOOD COUNT UP 60 tablet 11  . furosemide (LASIX) 20 MG tablet Take 1 tablet (20 mg total) by mouth daily. For two days take twice daily. 01/18/2015 to 01/19/2015, then back to one tablet daily. 90 tablet 3  . loratadine (CLARITIN) 10 MG tablet Take 1 tablet (10 mg total) by mouth daily. 30 tablet 11  . NEXIUM 40 MG capsule TAKE 1 CAPSULE BY MOUTH EVERY MORNING BEFORE BREAKFAST 30 capsule 3  . rosuvastatin (CRESTOR) 20 MG tablet Take 20 mg by mouth daily. Take 1 tablet Monday, Wednesday, and Friday. Pt states she takes 1/2 tablet on Tuesday & Thursday (06/07/14)    . sucralfate (CARAFATE) 1 G tablet TAKE 1 TABLET BY MOUTH TWICE DAILY 60 tablet 11  . triamcinolone cream (KENALOG) 0.1 % APPLY TOPICALLY TWICE DAILY 30 g 0  . nitroGLYCERIN (NITROSTAT) 0.4 MG SL tablet Place 1 tablet (0.4 mg total) under the tongue every 5 (five) minutes as needed for chest pain. 25 tablet 1   Current Facility-Administered Medications  Medication Dose Route Frequency Provider Last Rate Last Dose  . cyanocobalamin ((VITAMIN B-12)) injection 1,000 mcg  1,000 mcg Intramuscular Q30 days [name], MD   1,000 mcg at 09/16/11 1341    Allergies:   Ace inhibitors    Social History:  The patient  reports that she quit smoking about 3 years ago. Her smoking use included Cigarettes. She has a 5 pack-year smoking history. She has never used smokeless tobacco. She reports that she does not drink alcohol or use illicit drugs.   Family History:  The patient's family history includes Alcohol abuse in her brother and father; Cancer in her brother; Colon cancer in her brother; Diabetes in her brother and sister; Heart attack in her brother and father; Heart disease in  her mother and sister; Hypertension in her brother, father, mother, and sister; Stroke in her father and mother.    ROS:  Please see the history of present illness.   All other systems are reviewed and negative.    PHYSICAL EXAM: VS:  BP 134/88 mmHg  Pulse 91  Ht 5\' 5"  (1.651 m)  Wt 184 lb 3.2 oz (83.553 kg)  BMI 30.65 kg/m2 , BMI Body mass index is 30.65 kg/(m^2). GEN: Well nourished, well developed, in no acute distress HEENT: normal Neck: no JVD, carotid bruits, or masses Cardiac: RRR; no murmurs, rubs, or gallops,no edema  Respiratory:  clear to auscultation bilaterally, normal work of breathing GI: soft, nontender, nondistended, + BS MS: no deformity or atrophy Skin: warm and dry, no rash Neuro:  Strength and sensation are intact Psych: euthymic mood, full affect   EKG:  EKG is ordered today. The ekg ordered today demonstrates V paced HR 70. Unchanged from previous.    Recent Labs: 03/09/2014: TSH 1.06 09/27/2014: Pro B Natriuretic peptide (BNP) 159.0* 12/26/2014: ALT 14; BUN 22; Creatinine 1.13*; Hemoglobin 10.6*; Platelets 204; Potassium 4.1; Sodium 139    Lipid Panel    Component Value Date/Time   CHOL 150 03/30/2013 0816   TRIG 102.0 03/30/2013 0816   HDL 32.80* 03/30/2013 0816   CHOLHDL 5 03/30/2013 0816   VLDL 20.4 03/30/2013 0816   LDLCALC 97 03/30/2013 0816   LDLDIRECT 161.8 02/21/2013 1416       Wt Readings from Last 3 Encounters:  01/18/15 184 lb 3.2 oz (83.553 kg)  12/26/14 181 lb 3.2 oz (82.192 kg)  11/29/14 180 lb 9.6 oz (81.92 kg)      Other studies Reviewed: Additional studies/ records that were reviewed today include:  - LHC (04/2008): Normal coronary arteries. - Echo (05/03/12): Severe LVH with mid cavity obliteration during systole, EF 60-65%, moderate LAE, PASP 32. - Echo (06/28/14): Severe LVH, EF 40-45%, no RWMA, restrictive physiology, septal dyssynergy, sept-lat and apical-basal dyssynchrony, mild MR, mod to severe LAE, mod reduced RVSF, mod TR, PASP 65 mmHg - Nuclear (09/2014):small fixed defect in inferolateral region and small apical defect consistent with scar and possible soft tissue attenuation. No evidence for ischemia. LV Ejection Fraction: 42%. LV Wall Motion: Diffuse hypokinesis.    ASSESSMENT AND PLAN:  [name] is a 69 y.o. female with a history of  hypertrophic cardiomyopathy, chronic systolic/diastolic CHF (EF [id number]), complete heart block s/p pacemaker, chronic atrial fibrillation, restless leg syndrome, anemia, CKD, HL, sleep apnea, asthma/COPD, depression who was added on to my office schedule today for worsening SOB.   Dyspnea:She has chronic dyspnea that is felt to multifactorial and due to CHF and chronic lung disease as she previously used tobacco heavily. However, she was added onto my schedule for worsening SOB and sx c/w PND last night and this AM. I had ambulatory 02 sats performed in the office which did show a drop in 02 sats to 84% with ambulation. Resting 02 sat 99% on RA. She has no hx of blood clots and no LE edema/pain. She may qualify for home 02. Her case was discussed with Dr. Ron Parker (the DOD) and we decided to increase her lasix for two days and set her up for a pulmonology referral. We will also order a CXR, BMET, CBC, and BNP with close follow up in our office in the next 1-2 weeks.  -- She does not appear  volume overloaded, but due to her sx of PND we will empirically increase  her daily lasix 20mg  qd to 20mg  BID for two days   Chronic systolic dysfunction/ HOCM--> no gradient -- Previous EF in 2013 was normal at 60-65%. Repeat ECHO in 06/2014 w/ EF 40-45%. There is evidence of restrictive physiology and severe LVH. Previous cardiac catheterization in 2009 demonstrated normal coronary arteries.  -- She has a history of allergy to ACE inhibitor with angioedema. Continue Coreg 25mg  BID.  -- Will temporarily increase lasix as above.   HYPERTENSION: 134/77mmg Hg. Continue current regimen  HLD: Continue crestor.  Chronic atrial fibrillation: She is not a Coumadin candidate secondary to history of GI bleeding.  CHB s/p PACEMAKER-[facility]: Follow up with EP as planned.  CKD stage III: Recent creatinine stable. 1.13 (12/26/14). Repeat BMET today   Current medicines are reviewed at length with the patient today.  The patient does not have concerns regarding medicines.  The following changes have been made:  Increase in lasix to 20mg  BID x2 days.   Labs/ tests ordered today include:   Orders Placed This Encounter  Procedures  . DG Chest 2 View  . Basic Metabolic Panel (BMET)  . CBC  . B Nat Peptide  . Ambulatory referral to Pulmonology  . EKG 12-Lead     Disposition:   She has a previously scheduled appointment on 01/29/15 w/ Richardson Dopp PA-C. She will call the office if she needs to be seen sooner   Signed, Crista Luria  01/18/2015 3:26 PM    [address], [place], [place]  [address] Phone: [phone]; Fax: [phone]

## 2015-01-18 NOTE — Telephone Encounter (Signed)
Pt c/o Shortness Of Breath: STAT if SOB developed within the last 24 hours or pt is noticeably SOB on the phone  1. Are you currently SOB (can you hear that pt is SOB on the phone)? Yes  2. How long have you been experiencing SOB? 2 days  3. Are you SOB when sitting or when up moving around? At rest  4. Are you currently experiencing any other symptoms? Pt states her L arm has been hurting x 2 months

## 2015-01-18 NOTE — Patient Instructions (Addendum)
Your physician has recommended you make the following change in your medication:   INCREASE Lasix 20 mg twice daily by mouth, if your shortness of breath improves, go back to Lasix 20 mg daily by mouth. If your shortness of breath is not better call the office.  Your physician recommends that you have labs today. BMET, CBC, and BNP.  Your physician recommends that you schedule a follow-up appointment in: 7 to 10 days with a PA  A chest x-ray takes a picture of the organs and structures inside the chest, including the heart, lungs, and blood vessels. This test can show several things, including, whether the heart is enlarges; whether fluid is building up in the lungs; and whether pacemaker / defibrillator leads are still in place.  You have been referred to Dr. Annamaria Boots, pulmologist.

## 2015-01-18 NOTE — Telephone Encounter (Addendum)
Spoke with patient and this has been going on for a while but last night she could not sleep.  She took a allergy pill this morning and tried to lay down and she still can not sleep.  She says this was the worst episode last night.  She was having to sit up on the side of bed periodically to catch her breath.  She says her weight is up, no other symptoms

## 2015-01-25 ENCOUNTER — Ambulatory Visit: Payer: Medicare Other | Admitting: Physician Assistant

## 2015-01-26 ENCOUNTER — Ambulatory Visit: Payer: Medicare Other | Admitting: Physician Assistant

## 2015-01-29 ENCOUNTER — Ambulatory Visit: Payer: Medicare Other | Admitting: Physician Assistant

## 2015-02-15 ENCOUNTER — Encounter: Payer: Self-pay | Admitting: Physician Assistant

## 2015-02-15 ENCOUNTER — Ambulatory Visit (INDEPENDENT_AMBULATORY_CARE_PROVIDER_SITE_OTHER): Payer: Medicare Other | Admitting: Physician Assistant

## 2015-02-15 VITALS — BP 120/90 | HR 70 | Ht 65.0 in | Wt 187.0 lb

## 2015-02-15 DIAGNOSIS — I422 Other hypertrophic cardiomyopathy: Secondary | ICD-10-CM | POA: Diagnosis not present

## 2015-02-15 DIAGNOSIS — R0602 Shortness of breath: Secondary | ICD-10-CM | POA: Diagnosis not present

## 2015-02-15 DIAGNOSIS — N183 Chronic kidney disease, stage 3 unspecified: Secondary | ICD-10-CM

## 2015-02-15 DIAGNOSIS — I10 Essential (primary) hypertension: Secondary | ICD-10-CM | POA: Diagnosis not present

## 2015-02-15 DIAGNOSIS — I482 Chronic atrial fibrillation, unspecified: Secondary | ICD-10-CM

## 2015-02-15 DIAGNOSIS — Z950 Presence of cardiac pacemaker: Secondary | ICD-10-CM

## 2015-02-15 DIAGNOSIS — I5032 Chronic diastolic (congestive) heart failure: Secondary | ICD-10-CM

## 2015-02-15 DIAGNOSIS — E785 Hyperlipidemia, unspecified: Secondary | ICD-10-CM | POA: Diagnosis not present

## 2015-02-15 DIAGNOSIS — I442 Atrioventricular block, complete: Secondary | ICD-10-CM

## 2015-02-15 LAB — BASIC METABOLIC PANEL
BUN: 23 mg/dL (ref 6–23)
CO2: 31 mEq/L (ref 19–32)
Calcium: 9.9 mg/dL (ref 8.4–10.5)
Chloride: 104 mEq/L (ref 96–112)
Creatinine, Ser: 1.27 mg/dL — ABNORMAL HIGH (ref 0.40–1.20)
GFR: 53.33 mL/min — AB (ref >60.00)
Glucose, Bld: 107 mg/dL — ABNORMAL HIGH (ref 70–99)
POTASSIUM: 4.5 meq/L (ref 3.5–5.1)
SODIUM: 140 meq/L (ref 135–145)

## 2015-02-15 LAB — BRAIN NATRIURETIC PEPTIDE: PRO B NATRI PEPTIDE: 363 pg/mL — AB (ref 0.0–100.0)

## 2015-02-15 MED ORDER — HYDRALAZINE HCL 10 MG PO TABS: 10.0000 mg | ORAL_TABLET | Freq: Two times a day (BID) | ORAL | Status: DC

## 2015-02-15 NOTE — Patient Instructions (Signed)
Your physician has recommended you make the following change in your medication:   START Hydralazine 10 mg by mouth twice daily  Your physician recommends that you schedule a follow-up appointment in: 6weeks with Richardson Dopp PA  Your physician recommends that you have labs today BMET and BNP

## 2015-02-15 NOTE — Progress Notes (Signed)
Cardiology Office Note   Date:  02/15/2015   ID:  [name], DOB Mar 11, 1946, MRN [id number]  PCP:  Tawanna Sat, MD  Cardiologist:  Dr. Thompson Grayer     Chief Complaint  Patient presents with  . Cardiomyopathy  . Atrial Fibrillation  . Follow-up     History of Present Illness: [name] is a 69 y.o. female with a hx of hypertrophic cardiomyopathy, diastolic CHF, complete heart block status post pacemaker, chronic atrial fibrillation, restless leg syndrome, anemia, CKD, HL, sleep apnea, asthma/COPD, depression. She is not on Coumadin secondary to history of gastric AVMs and prior GI bleeding. She is intolerant to statins. She has a history of chest pain relieved by taking Xanax. Echo in 7/15 demonstrated reduced LV function with an EF of 40-45%.   Last seen by Dr. Thompson Grayer in 10/2014. She did not require further evaluation of reduced EF.   Seen by Nell Range, PA-C 01/18/15 for dyspnea.  O2 dropped from 99% >> 84% with ambulation.  Lasix was increased and she was referred to Pulmonology.  BNP was elevated.  CXR was ordered but was never done.  She returns for FU.  Since last seen, she feels her breathing improved with the extra Lasix.  She remains chronically short of breath.  She is NYHA 2b-3.  She denies orthopnea, PND.  She has stable pedal edema.  Denies chest pain.  She has occasional flutters in her chest.  She denies syncope.     Studies: - LHC (04/2008): Normal coronary arteries. - Echo (05/03/12): Severe LVH with mid cavity obliteration during systole, EF 60-65%, moderate LAE, PASP 32. - Echo (7/15): Severe LVH, EF 40-45%, no RWMA, restrictive physiology, septal dyssynergy, sept-lat and apical-basal dyssynchrony, mild MR, mod to severe LAE, mod reduced RVSF, mod TR, PASP 65 mmHg - Nuclear (08/1999): No ischemia  - Myoview 09/2014:  Small fixed defect in inferolateral region and small apical defect consistent with scar and possible soft  tissue attenuation. No evidence for ischemia. EF 42%. Low Risk    Past Medical History  Diagnosis Date  . Angioedema     2/2 ACE  . RLS (restless legs syndrome)     Dx 06/2007  . Anemia   . Arteriovenous malformation of stomach   . Permanent atrial fibrillation   . Renal failure     baseline creatinine 1.6  . Hyperlipidemia   . Depression   . Diastolic heart failure   . Arthritis   . Sleep apnea   . Asthma   . Hx of colonic polyp 08/13/10    adenomatous  . Fatty liver 07/26/10  . Panic attacks   . Hypertension   . Complete heart block     s/p PPM 1998  . Hypertrophic cardiomyopathy     dx by Dr [name] 2009  . Hx of colonoscopy   . GI bleed   . Panic attack   . DDD (degenerative disc disease)   . History of alcohol abuse Stopped Fall 2012  . History of tobacco use Quit Fall 2012  . Shortness of breath     sob on exertation  . CHF (congestive heart failure)   . Angina   . GERD (gastroesophageal reflux disease)   . AVM (arteriovenous malformation) of colon     small intestine; stomach  . Right arm pain 01/08/2012  . Hyperlipidemia   . Blood transfusion   . Hx of cardiovascular stress test     a. Lexiscan Myoview (10/15):  Small inferolateral and apical defect c/w scar and poss soft tissue attenuation, no ischemia, EF 42%    Past Surgical History  Procedure Laterality Date  . Tubal ligation  04/01/2000  . Pacemaker insertion  [place], most recent gen change by [name] 4/12  . Cardiac catheterization    . Esophagogastroduodenoscopy  12/23/2011    Procedure: ESOPHAGOGASTRODUODENOSCOPY (EGD);  Surgeon: [name], MD;  Location: [facility];  Service: Endoscopy;  Laterality: N/A;  . Givens capsule study  12/23/2011    Procedure: GIVENS CAPSULE STUDY;  Surgeon: [name], MD;  Location: [facility];  Service: Endoscopy;  Laterality: N/A;     Current Outpatient Prescriptions  Medication Sig Dispense Refill  . ALPRAZolam (XANAX) 0.5 MG tablet Take 1 tablet  (0.5 mg total) by mouth as needed for sleep or anxiety. 30 tablet 1  . ANUCORT-HC 25 MG suppository UNWRAP AND INSERT 1 SUPPOSITORY RECTALLY AT BEDTIME FOR 10 NIGHTS 10 suppository 0  . aspirin EC 81 MG tablet Take 81 mg by mouth daily. Pt states she sometimes takes 1/2 of a 325 mg instead of 81 mg daily    . Calcium Carb-Cholecalciferol (CALCIUM-VITAMIN D) 600-400 MG-UNIT TABS Take 1 tablet by mouth 2 (two) times daily. 60 tablet 12  . carvedilol (COREG) 25 MG tablet Take 1 tablet (25 mg total) by mouth 2 (two) times daily. 60 tablet 11  . ferrous sulfate 325 (65 FE) MG tablet TAKE 1 TABLET BY MOUTH TWICE DAILY WITH MEALS TO KEEP BLOOD COUNT UP 60 tablet 11  . furosemide (LASIX) 20 MG tablet Take 1 tablet (20 mg total) by mouth daily. For two days take twice daily. 01/18/2015 to 01/19/2015, then back to one tablet daily. 90 tablet 3  . loratadine (CLARITIN) 10 MG tablet Take 1 tablet (10 mg total) by mouth daily. 30 tablet 11  . NEXIUM 40 MG capsule TAKE 1 CAPSULE BY MOUTH EVERY MORNING BEFORE BREAKFAST 30 capsule 3  . nitroGLYCERIN (NITROSTAT) 0.4 MG SL tablet Place 1 tablet (0.4 mg total) under the tongue every 5 (five) minutes as needed for chest pain. 25 tablet 1  . rosuvastatin (CRESTOR) 20 MG tablet Take 20 mg by mouth daily. Take 1 tablet Monday, Wednesday, and Friday. Pt states she takes 1/2 tablet on Tuesday & Thursday (06/07/14)    . senna (SENOKOT) 8.6 MG tablet Take 1 tablet by mouth daily.    . sucralfate (CARAFATE) 1 G tablet TAKE 1 TABLET BY MOUTH TWICE DAILY 60 tablet 11  . triamcinolone cream (KENALOG) 0.1 % APPLY TOPICALLY TWICE DAILY 30 g 0   Current Facility-Administered Medications  Medication Dose Route Frequency Provider Last Rate Last Dose  . cyanocobalamin ((VITAMIN B-12)) injection 1,000 mcg  1,000 mcg Intramuscular Q30 days [name], MD   1,000 mcg at 09/16/11 1341    Allergies:   Ace inhibitors    Social History:  The patient  reports that she quit smoking  about 3 years ago. Her smoking use included Cigarettes. She has a 5 pack-year smoking history. She has never used smokeless tobacco. She reports that she does not drink alcohol or use illicit drugs.   Family History:  The patient's family history includes Alcohol abuse in her brother and father; Cancer in her brother; Colon cancer in her brother; Diabetes in her brother and sister; Heart attack in her brother and father; Heart disease in her mother and sister; Hypertension in her brother, father, mother, and sister; Stroke  in her father and mother.    ROS:   Please see the history of present illness.   Review of Systems  Respiratory: Positive for cough and wheezing.   Musculoskeletal: Positive for myalgias.  All other systems reviewed and are negative.     PHYSICAL EXAM: VS:  BP 120/90 mmHg  Pulse 70  Ht 5\' 5"  (1.651 m)  Wt 187 lb (84.823 kg)  BMI 31.12 kg/m2    Wt Readings from Last 3 Encounters:  02/15/15 187 lb (84.823 kg)  01/18/15 184 lb 3.2 oz (83.553 kg)  12/26/14 181 lb 3.2 oz (82.192 kg)     GEN: Well nourished, well developed, in no acute distress HEENT: normal Neck: no JVD, no masses Cardiac:  Normal S1/S2, RRR; no murmur ,  no rubs or gallops, no edema  Respiratory:  clear to auscultation bilaterally, no wheezing, rhonchi or rales. GI: soft, nontender, nondistended, + BS MS: no deformity or atrophy Skin: warm and dry  Neuro:  CNs II-XII intact, Strength and sensation are intact Psych: Normal affect  O2: 97% RA >> 93% with ambulation  EKG:  EKG is ordered today.  It demonstrates:   V paced, HR 70   Recent Labs: 03/09/2014: TSH 1.06 12/26/2014: ALT 14 01/18/2015: BUN 21; Creatinine 1.25*; Hemoglobin 10.1*; Platelets 222.0; Potassium 3.6; Pro B Natriuretic peptide (BNP) 537.0*; Sodium 139    Recent Labs  03/09/14 1233  07/24/14 1153 09/27/14 1649 12/26/14 1209 01/18/15 1538  PROBNP 397.0*  --   --  159.0*  --  537.0*  K 3.7  --   --  4.7 4.1 3.6  BUN 22   --   --  34* 22 21  CREATININE 1.2  --   --  1.6* 1.13* 1.25*  HGB  --   < > 10.4*  --  10.6* 10.1*  < > = values in this interval not displayed.   Lipid Panel    Component Value Date/Time   CHOL 150 03/30/2013 0816   TRIG 102.0 03/30/2013 0816   HDL 32.80* 03/30/2013 0816   CHOLHDL 5 03/30/2013 0816   VLDL 20.4 03/30/2013 0816   LDLCALC 97 03/30/2013 0816   LDLDIRECT 161.8 02/21/2013 1416      ASSESSMENT AND PLAN:  SOB (shortness of breath):  Her breathing is improved.  O2 sats were normal today with ambulation.  She likely was somewhat volume overloaded before.  She remains short of breath and this is fairly chronic.  This is likely multifactorial.  I have encouraged her to get her CXR.  Pulmonology appointment is pending.      -  BMET, BNP today.  Hypertrophic cardiomyopathy:  Her EF has reduced to 40-45%.  She is on max dose beta blocker.  She has a true allergy to ACEI.    -  Start Hydralazine 10 mg Twice daily   Hypertension:  Controlled.   Chronic diastolic heart failure:  Obtain BNP today. Adjust Lasix if needed.   HLD (hyperlipidemia):  She is tolerating Crestor.   Complete heart block s/p PACEMAKER-[facility]:  FU with EP as planned.   CHRONIC KIDNEY DISEASE STAGE III (MODERATE):  Check BMET today.   Chronic atrial fibrillation:  Rate controlled.  She is not a candidate for anticoagulation due to prior GI bleeding.    Current medicines are reviewed at length with the patient today.  The patient does not have concerns regarding medicines.  The following changes have been made:  Add Hydralazine for afterload reduction  Labs/  tests ordered today include:   Orders Placed This Encounter  Procedures  . Basic Metabolic Panel (BMET)  . B Nat Peptide  . EKG 12-Lead     Disposition:   FU with me 6 weeks.   Signed, Versie Starks, MHS 02/15/2015 2:31 PM    [facility] [address], [place], [place]  [address] Phone: [phone]; Fax:  [phone]

## 2015-02-16 ENCOUNTER — Other Ambulatory Visit: Payer: Self-pay

## 2015-02-16 DIAGNOSIS — I5032 Chronic diastolic (congestive) heart failure: Secondary | ICD-10-CM

## 2015-02-16 MED ORDER — FUROSEMIDE 20 MG PO TABS: 20.0000 mg | ORAL_TABLET | Freq: Every day | ORAL | Status: DC

## 2015-02-22 ENCOUNTER — Ambulatory Visit (INDEPENDENT_AMBULATORY_CARE_PROVIDER_SITE_OTHER)
Admission: RE | Admit: 2015-02-22 | Discharge: 2015-02-22 | Disposition: A | Discharge status: Home / Self Care | Payer: Medicare Other | Source: Ambulatory Visit | Attending: Physician Assistant | Admitting: Physician Assistant

## 2015-02-22 DIAGNOSIS — R0602 Shortness of breath: Secondary | ICD-10-CM | POA: Diagnosis not present

## 2015-02-22 DIAGNOSIS — R0600 Dyspnea, unspecified: Secondary | ICD-10-CM | POA: Diagnosis not present

## 2015-02-23 ENCOUNTER — Other Ambulatory Visit (INDEPENDENT_AMBULATORY_CARE_PROVIDER_SITE_OTHER): Payer: Medicare Other | Admitting: *Deleted

## 2015-02-23 DIAGNOSIS — I5032 Chronic diastolic (congestive) heart failure: Secondary | ICD-10-CM | POA: Diagnosis not present

## 2015-02-23 LAB — BASIC METABOLIC PANEL
BUN: 29 mg/dL — ABNORMAL HIGH (ref 6–23)
CO2: 33 meq/L — AB (ref 19–32)
Calcium: 10.2 mg/dL (ref 8.4–10.5)
Chloride: 100 mEq/L (ref 96–112)
Creatinine, Ser: 1.39 mg/dL — ABNORMAL HIGH (ref 0.40–1.20)
GFR: 48.05 mL/min — ABNORMAL LOW (ref >60.00)
Glucose, Bld: 105 mg/dL — ABNORMAL HIGH (ref 70–99)
Potassium: 4.3 mEq/L (ref 3.5–5.1)
SODIUM: 137 meq/L (ref 135–145)

## 2015-02-26 ENCOUNTER — Other Ambulatory Visit: Payer: Self-pay

## 2015-02-26 DIAGNOSIS — I5032 Chronic diastolic (congestive) heart failure: Secondary | ICD-10-CM

## 2015-02-26 DIAGNOSIS — I10 Essential (primary) hypertension: Secondary | ICD-10-CM

## 2015-03-01 ENCOUNTER — Telehealth: Payer: Self-pay | Admitting: Internal Medicine

## 2015-03-01 NOTE — Telephone Encounter (Signed)
Pt calling to confirm her lab appt for tomorrow.  Pt states someone contacted her to remind her of this appt.  Confirmed with the pt that she is scheduled to come in for lab at our office tomorrow 3/18 to check a bmet.  Pt verbalized understanding of appt date.

## 2015-03-01 NOTE — Telephone Encounter (Signed)
New Msg       Pt states she is returning a call from today.   Does she still need to complete labs?    Please call back.

## 2015-03-02 ENCOUNTER — Other Ambulatory Visit (INDEPENDENT_AMBULATORY_CARE_PROVIDER_SITE_OTHER): Payer: Medicare Other | Admitting: *Deleted

## 2015-03-02 DIAGNOSIS — I5032 Chronic diastolic (congestive) heart failure: Secondary | ICD-10-CM

## 2015-03-02 LAB — BASIC METABOLIC PANEL
BUN: 21 mg/dL (ref 6–23)
CHLORIDE: 102 meq/L (ref 96–112)
CO2: 30 meq/L (ref 19–32)
CREATININE: 1.35 mg/dL — AB (ref 0.40–1.20)
Calcium: 9.7 mg/dL (ref 8.4–10.5)
GFR: 49.7 mL/min — ABNORMAL LOW (ref >60.00)
Glucose, Bld: 108 mg/dL — ABNORMAL HIGH (ref 70–99)
POTASSIUM: 3.8 meq/L (ref 3.5–5.1)
SODIUM: 137 meq/L (ref 135–145)

## 2015-03-05 ENCOUNTER — Telehealth: Payer: Self-pay | Admitting: *Deleted

## 2015-03-05 NOTE — Telephone Encounter (Signed)
ptcb and has been notified of lab results with verbal understanding. Pt states her hair has started falling out and did not know if it was her medications . I said I did not think it was. I advised pt to call her PCP to discuss further, pt said ok.

## 2015-03-08 ENCOUNTER — Ambulatory Visit (INDEPENDENT_AMBULATORY_CARE_PROVIDER_SITE_OTHER): Payer: Medicare Other | Admitting: Internal Medicine

## 2015-03-08 ENCOUNTER — Encounter: Payer: Self-pay | Admitting: Internal Medicine

## 2015-03-08 VITALS — BP 110/68 | HR 70 | Ht 65.0 in | Wt 181.4 lb

## 2015-03-08 DIAGNOSIS — R0600 Dyspnea, unspecified: Secondary | ICD-10-CM

## 2015-03-08 DIAGNOSIS — J449 Chronic obstructive pulmonary disease, unspecified: Secondary | ICD-10-CM

## 2015-03-08 NOTE — Progress Notes (Signed)
03/08/15- 69 yoF former smoker Referred by Lattie Corns NP. C/O sob over a year getting progressilvy worse. SOB worse with exertion. Occasional wheezing  Medical hx includes moderate COPD, dCHF, AFib/ pacer, OSA, anemia Quit smoking around 2012. Gradually more aware of dyspnea on exertion with hills and stairs. Not much cough, wheeze or sneeze. Never told of asthma. Does admit spring and fall nasal congestion, eyes water. Remote history of pneumonia twice. Occasional palpitation. CXR 02/22/15 FINDINGS: There is no focal parenchymal opacity, pleural effusion, or pneumothorax. There is stable cardiomegaly. There is a dual lead cardiac pacer. The osseous structures are unremarkable. IMPRESSION: No active cardiopulmonary disease. Electronically Signed  By: Kathreen Devoid  On: 02/22/2015 16:43  Prior to Admission medications   Medication Sig Start Date End Date Taking? Authorizing Provider  ALPRAZolam Duanne Moron) 0.5 MG tablet Take 1 tablet (0.5 mg total) by mouth as needed for sleep or anxiety. 04/27/14  Yes Leone Brand, MD  aspirin EC 81 MG tablet Take 81 mg by mouth daily. Pt states she sometimes takes 1/2 of a 325 mg instead of 81 mg daily   Yes Historical Provider, MD  Calcium Carb-Cholecalciferol (CALCIUM-VITAMIN D) 600-400 MG-UNIT TABS Take 1 tablet by mouth 2 (two) times daily. 08/18/12  Yes Carolin Guernsey, MD  carvedilol (COREG) 25 MG tablet Take 1 tablet (25 mg total) by mouth 2 (two) times daily. 06/07/14  Yes Thompson Grayer, MD  ferrous sulfate 325 (65 FE) MG tablet TAKE 1 TABLET BY MOUTH TWICE DAILY WITH MEALS TO KEEP BLOOD COUNT UP 08/03/14  Yes Leone Brand, MD  furosemide (LASIX) 20 MG tablet Take 1 tablet (20 mg total) by mouth daily. Except for Monday, Wednesday and Friday take 40 mg by mouth daily. 02/16/15  Yes Liliane Shi, PA-C  hydrALAZINE (APRESOLINE) 10 MG tablet Take 1 tablet (10 mg total) by mouth 2 (two) times daily. 02/15/15  Yes Scott Joylene Draft, PA-C  loratadine  (CLARITIN) 10 MG tablet Take 1 tablet (10 mg total) by mouth daily. 09/18/14  Yes Patrecia Pour, MD  NEXIUM 40 MG capsule TAKE 1 CAPSULE BY MOUTH EVERY MORNING BEFORE BREAKFAST 05/17/13  Yes Carolin Guernsey, MD  nitroGLYCERIN (NITROSTAT) 0.4 MG SL tablet Place 1 tablet (0.4 mg total) under the tongue every 5 (five) minutes as needed for chest pain. 01/18/15  Yes Eileen Stanford, PA-C  rosuvastatin (CRESTOR) 20 MG tablet Take 20 mg by mouth daily. Take 1 tablet Monday, Wednesday, and Friday. Pt states she takes 1/2 tablet on Tuesday & Thursday (06/07/14) 04/27/14  Yes Leone Brand, MD  senna (SENOKOT) 8.6 MG tablet Take 1 tablet by mouth daily.   Yes Historical Provider, MD  sucralfate (CARAFATE) 1 G tablet TAKE 1 TABLET BY MOUTH TWICE DAILY 04/27/14  Yes Leone Brand, MD  triamcinolone cream (KENALOG) 0.1 % APPLY TOPICALLY TWICE DAILY 11/28/14  Yes Leone Brand, MD   Past Medical History  Diagnosis Date  . Angioedema     2/2 ACE  . RLS (restless legs syndrome)     Dx 06/2007  . Anemia   . Arteriovenous malformation of stomach   . Permanent atrial fibrillation   . Renal failure     baseline creatinine 1.6  . Hyperlipidemia   . Depression   . Diastolic heart failure   . Arthritis   . Sleep apnea   . Asthma   . Hx of colonic polyp 08/13/10    adenomatous  . Fatty  liver 07/26/10  . Panic attacks   . Hypertension   . Complete heart block     s/p PPM 1998  . Hypertrophic cardiomyopathy     dx by Dr [name] 2009  . Hx of colonoscopy   . GI bleed   . Panic attack   . DDD (degenerative disc disease)   . History of alcohol abuse Stopped Fall 2012  . History of tobacco use Quit Fall 2012  . Shortness of breath     sob on exertation  . CHF (congestive heart failure)   . Angina   . GERD (gastroesophageal reflux disease)   . AVM (arteriovenous malformation) of colon     small intestine; stomach  . Right arm pain 01/08/2012  . Hyperlipidemia   . Blood transfusion   . Hx of  cardiovascular stress test     a. Lexiscan Myoview (10/15):  Small inferolateral and apical defect c/w scar and poss soft tissue attenuation, no ischemia, EF 42%   Past Surgical History  Procedure Laterality Date  . Tubal ligation  04/01/2000  . Pacemaker insertion  [place], most recent gen change by [name] 4/12  . Cardiac catheterization    . Esophagogastroduodenoscopy  12/23/2011    Procedure: ESOPHAGOGASTRODUODENOSCOPY (EGD);  Surgeon: [name], MD;  Location: [facility];  Service: Endoscopy;  Laterality: N/A;  . Givens capsule study  12/23/2011    Procedure: GIVENS CAPSULE STUDY;  Surgeon: [name], MD;  Location: [facility];  Service: Endoscopy;  Laterality: N/A;   Family History  Problem Relation Age of Onset  . Hypertension Mother   . Stroke Mother   . Hypertension Father   . Stroke Father   . Hypertension Sister   . Hypertension Brother   . Colon cancer Brother   . Diabetes Sister   . Diabetes Brother   . Cancer Brother   . Heart attack Brother   . Heart attack Father   . Heart disease Mother   . Heart disease Sister   . Alcohol abuse Brother   . Alcohol abuse Father    History   Social History  . Marital Status: Divorced    Spouse Name: N/A  . Number of Children: 4  . Years of Education: 9   Occupational History  . disabled   . previously- house cleaning    Social History Main Topics  . Smoking status: Former Smoker -- 0.10 packs/day for 50 years    Types: Cigarettes    Quit date: 08/16/2011  . Smokeless tobacco: Never Used     Comment: quit 2012  . Alcohol Use: No     Comment: quit 08/16/2011  . Drug Use: No  . Sexual Activity: Not on file   Other Topics Concern  . Not on file   Social History Narrative   On disability for heart failure/pacemaker.     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    4 grown children,  8 grandchildren.     Drinks alcohol a few times a week socially. At one time, the most she reports drinking  is about 3 mixed drinks.   Lives with daughter [name] and son. Will be moving 04/2011 to different house because concerned children are taking advantage of her financial situation.    No illicit drugs.   Smokes few cigarettes daily, especially when stressed. Started smoking @ age 73. Quit January 2012 2/2 health but started smoking again 02/2011.  Health Care POA:    Emergency Contact: daughter, Verdene Lennert [phone]   End of Life Plan: gave pt AD info pamphlet   Who lives with you: no one- lives in section 8 housing   Any pets: none   Diet: Pt has a varied diet of protein, starch and vegetables   Exercise: Pt has no regular exercise routine.   Seatbelts: Pt reports wearing seatbelt when in vehicles.    Hobbies: dancing   ROS-see HPI   Negative unless "+" Constitutional:    weight loss, night sweats, fevers, chills, fatigue, lassitude. HEENT:    headaches, difficulty swallowing, tooth/dental problems, sore throat,       sneezing, itching, ear ache, nasal congestion, post nasal drip, snoring CV:    chest pain, orthopnea, PND, swelling in lower extremities, anasarca,                                  dizziness, palpitations Resp:   +shortness of breath with exertion or at rest.                productive cough,   non-productive cough, coughing up of blood.              change in color of mucus.  wheezing.   Skin:    rash or lesions. GI:  No-   heartburn, indigestion, abdominal pain, nausea, vomiting, diarrhea,                 change in bowel habits, loss of appetite GU: dysuria, change in color of urine, no urgency or frequency.   flank pain. MS:   joint pain, stiffness, decreased range of motion, back pain. Neuro-     nothing unusual Psych:  change in mood or affect.  depression or anxiety.   memory loss.  OBJ- Physical Exam General- Alert, Oriented, Affect-appropriate, Distress- none acute Skin- rash-none, lesions- none, excoriation- none Lymphadenopathy- none Head- atraumatic  +  subcut hard, fixed knot R mandible she associates with bad tooth            Eyes- Gross vision intact, PERRLA, conjunctivae and secretions clear            Ears- Hearing, canals-normal            Nose- Clear, no-Septal dev,+ mucus, polyps, erosion, perforation             Throat- Mallampati III-IV , mucosa clear , drainage- none, tonsils- atrophic Neck- flexible , trachea midline, no stridor , thyroid nl, carotid no bruit Chest - symmetrical excursion , unlabored           Heart/CV- RRR , no murmur , no gallop  , no rub, nl s1 s2                           - JVD- none , edema- none, stasis changes- none, varices- none           Lung- clear to P&A, wheeze- none, cough- none , dullness-none, rub- none           Chest wall- +R pacemaker Abd-  Br/ Gen/ Rectal- Not done, not indicated Extrem- cyanosis- none, clubbing, none, atrophy- none, strength- nl Neuro- grossly intact to observation

## 2015-03-08 NOTE — Patient Instructions (Signed)
Order- schedule PFT and 6 MWT  Dx dyspnea on exertion                              ONOX on room air  Please call as needed

## 2015-03-11 NOTE — Assessment & Plan Note (Signed)
Dyspnea on exertion is probably from both cardiac and pulmonary causes. Plan-overnight oximetry, 6 minute walk test, PFT

## 2015-03-13 DIAGNOSIS — R0600 Dyspnea, unspecified: Secondary | ICD-10-CM | POA: Diagnosis not present

## 2015-03-13 DIAGNOSIS — J449 Chronic obstructive pulmonary disease, unspecified: Secondary | ICD-10-CM | POA: Diagnosis not present

## 2015-03-19 ENCOUNTER — Other Ambulatory Visit: Payer: Self-pay

## 2015-03-19 ENCOUNTER — Other Ambulatory Visit: Payer: Self-pay | Admitting: Family Medicine

## 2015-03-19 DIAGNOSIS — Z1231 Encounter for screening mammogram for malignant neoplasm of breast: Secondary | ICD-10-CM

## 2015-03-21 ENCOUNTER — Other Ambulatory Visit: Payer: Self-pay | Admitting: Family Medicine

## 2015-03-21 NOTE — Telephone Encounter (Signed)
Refill request. Will forward to PCP for review. Devory Mckinzie, CMA. 

## 2015-03-26 ENCOUNTER — Other Ambulatory Visit: Payer: Self-pay | Admitting: *Deleted

## 2015-03-26 MED ORDER — ALPRAZOLAM 0.5 MG PO TABS: 0.5000 mg | ORAL_TABLET | ORAL | Status: DC | PRN

## 2015-03-26 NOTE — Telephone Encounter (Signed)
Patient advised as directed below and verbalized understanding. Siddharth Babington, CMA. 

## 2015-03-27 NOTE — Telephone Encounter (Signed)
Received another refill request for Xanax.  Pt advised that she the Rx was ready for pick up.  Pt stated she has a rash on back that is very painful.  Appt 03/28/15 at 2:15 with PCP.  Derl Barrow, RN

## 2015-03-28 ENCOUNTER — Ambulatory Visit: Payer: Medicare Other

## 2015-03-28 ENCOUNTER — Ambulatory Visit (INDEPENDENT_AMBULATORY_CARE_PROVIDER_SITE_OTHER): Payer: Medicare Other | Admitting: Family Medicine

## 2015-03-28 ENCOUNTER — Encounter: Payer: Self-pay | Admitting: Family Medicine

## 2015-03-28 VITALS — BP 129/84 | HR 69 | Temp 98.0°F | Ht 65.0 in | Wt 184.0 lb

## 2015-03-28 DIAGNOSIS — M545 Low back pain, unspecified: Secondary | ICD-10-CM | POA: Insufficient documentation

## 2015-03-28 DIAGNOSIS — E785 Hyperlipidemia, unspecified: Secondary | ICD-10-CM

## 2015-03-28 LAB — LIPID PANEL
Cholesterol: 122 mg/dL (ref 0–200)
HDL: 42 mg/dL — ABNORMAL LOW (ref >=46)
LDL Cholesterol: 63 mg/dL (ref 0–99)
TRIGLYCERIDES: 86 mg/dL (ref <150)
Total CHOL/HDL Ratio: 2.9 Ratio
VLDL: 17 mg/dL (ref 0–40)

## 2015-03-28 MED ORDER — LIDOCAINE-PRILOCAINE 2.5-2.5 % EX CREA: TOPICAL_CREAM | CUTANEOUS | Status: DC

## 2015-03-28 NOTE — Assessment & Plan Note (Signed)
Rash resolved, no clear distribution by history and did not sound dermatomal, neuropathic, nor vesicular so little suspicion for shingles. Suspect she is having some muscle pain. Given age would not use antispasmodics, and with CKD trying to avoid NSAIDs... Trial of Emla cream. F/u if rash recurs.

## 2015-03-28 NOTE — Progress Notes (Signed)
   Subjective:    Patient ID: [name], female    DOB: 1946/07/09, 69 y.o.   MRN: [id number]  HPI  CC: rash  # Rash on back:  First noticed it 3 weeks ago, but now rash has gone away  Tried cream (triamcinolone 0.1%), alcohol  Daughter said it looked "ashy", had few bumps and itchy but has moved "deeper"  Now more painful. Lower back. Feels "crampy", hard to describe.  Worse when moving around, pain goes away when sitting still ROS: no fevers or chills, no nausea or vomiting  # Myalgias  Has continued muscle pains primarily in her upper arms  Similar pain as to when she had crestor decreased  Review of Systems   See HPI for ROS. All other systems reviewed and are negative.  Past medical history, surgical, family, and social history reviewed and updated in the EMR as appropriate. Objective:  BP 129/84 mmHg  Pulse 69  Temp(Src) 98 F (36.7 C) (Oral)  Ht 5\' 5"  (1.651 m)  Wt 184 lb (83.462 kg)  BMI 30.62 kg/m2 Vitals and nursing note reviewed  General: NAD CV: RRR, normal s1s2, no mrg Resp: CTAB nl effort (though noted to breath a little fast) Back: no deformity noted, mildly tender lower back to deep palpation near top of pelvis Skin: no rash noted lower back  Assessment & Plan:  See Problem List Documentation

## 2015-03-28 NOTE — Assessment & Plan Note (Signed)
Suspicious for continued statin myopathy/myalgias. Will re-check lipid panel today (pt fasting and last done in 2014), consider switch to pravastatin.

## 2015-03-28 NOTE — Patient Instructions (Addendum)
You may be having some side effects from the cholesterol medicine Crestor, which causes your muscle to ache. Talk to your cardiologist and see if they would also recommend switching you to Pravastatin, which doesn't cause muscle aches as much.  Use the Emla cream on your lower back as needed 2-3 times a day.   You are due for your Tetanus and Pertussis (whooping cough) vaccine. We will give this to you at your next visit.

## 2015-04-05 ENCOUNTER — Ambulatory Visit (INDEPENDENT_AMBULATORY_CARE_PROVIDER_SITE_OTHER): Payer: Medicare Other | Admitting: Physician Assistant

## 2015-04-05 ENCOUNTER — Encounter: Payer: Self-pay | Admitting: Physician Assistant

## 2015-04-05 ENCOUNTER — Telehealth: Payer: Self-pay | Admitting: Internal Medicine

## 2015-04-05 VITALS — Ht 65.0 in | Wt 181.8 lb

## 2015-04-05 DIAGNOSIS — I482 Chronic atrial fibrillation, unspecified: Secondary | ICD-10-CM

## 2015-04-05 DIAGNOSIS — Z950 Presence of cardiac pacemaker: Secondary | ICD-10-CM

## 2015-04-05 DIAGNOSIS — E785 Hyperlipidemia, unspecified: Secondary | ICD-10-CM | POA: Diagnosis not present

## 2015-04-05 DIAGNOSIS — I442 Atrioventricular block, complete: Secondary | ICD-10-CM

## 2015-04-05 DIAGNOSIS — I422 Other hypertrophic cardiomyopathy: Secondary | ICD-10-CM

## 2015-04-05 DIAGNOSIS — R0602 Shortness of breath: Secondary | ICD-10-CM

## 2015-04-05 DIAGNOSIS — I10 Essential (primary) hypertension: Secondary | ICD-10-CM | POA: Diagnosis not present

## 2015-04-05 DIAGNOSIS — M791 Myalgia, unspecified site: Secondary | ICD-10-CM

## 2015-04-05 DIAGNOSIS — I5042 Chronic combined systolic (congestive) and diastolic (congestive) heart failure: Secondary | ICD-10-CM

## 2015-04-05 DIAGNOSIS — N183 Chronic kidney disease, stage 3 unspecified: Secondary | ICD-10-CM

## 2015-04-05 LAB — BASIC METABOLIC PANEL
BUN: 18 mg/dL (ref 6–23)
CO2: 32 meq/L (ref 19–32)
CREATININE: 1.17 mg/dL (ref 0.40–1.20)
Calcium: 9.9 mg/dL (ref 8.4–10.5)
Chloride: 102 mEq/L (ref 96–112)
GFR: 58.6 mL/min — ABNORMAL LOW (ref >60.00)
GLUCOSE: 109 mg/dL — AB (ref 70–99)
Potassium: 4.3 mEq/L (ref 3.5–5.1)
SODIUM: 138 meq/L (ref 135–145)

## 2015-04-05 LAB — C-REACTIVE PROTEIN: CRP: 2.2 mg/dL (ref 0.5–20.0)

## 2015-04-05 LAB — TSH: TSH: 0.76 u[IU]/mL (ref 0.35–4.50)

## 2015-04-05 LAB — SEDIMENTATION RATE: SED RATE: 71 mm/h — AB (ref 0–22)

## 2015-04-05 LAB — CK: Total CK: 58 U/L (ref 7–177)

## 2015-04-05 NOTE — Telephone Encounter (Signed)
New message    Pt c/o of Chest Pain: 1. Are you having CP right now? Yes / no pain scales 1-10 -4 or 5 , not real bad.   2. Are you experiencing any other symptoms (ex. SOB, nausea, vomiting, sweating)? Sob , hurting from arm , shoulder, Pace maker on right ,  3. How long have you been experiencing CP? About month.   4. Is your CP continuous or coming and going? Coming/ going  5. Have you taken Nitroglycerin? No.     Has appt today with Richardson Dopp today @ 2 pm

## 2015-04-05 NOTE — Progress Notes (Signed)
Cardiology Office Note   Date:  04/05/2015   ID:  [name], DOB 03/03/1946, MRN [id number]  PCP:  Tawanna Sat, MD  Cardiologist:  Dr. Thompson Grayer     Chief Complaint  Patient presents with  . Congestive Heart Failure     History of Present Illness: [name] is a 69 y.o. female with a hx of hypertrophic cardiomyopathy, diastolic CHF, complete heart block status post pacemaker, chronic atrial fibrillation, restless leg syndrome, anemia, CKD, HL, sleep apnea, asthma/COPD, depression. She is not on Coumadin secondary to history of gastric AVMs and prior GI bleeding. She is intolerant to statins. She has a history of chest pain relieved by taking Xanax. Echo in 7/15 demonstrated reduced LV function with an EF of 40-45%.   Last seen by Dr. Thompson Grayer in 10/2014. She was not felt to require further evaluation of reduced EF.   Seen by Nell Range, PA-C 01/18/15 for dyspnea.  O2 dropped from 99% >> 84% with ambulation.  Lasix was increased and she was referred to Pulmonology.  BNP was elevated.  CXR was ordered but was never done.  Last seen here by me 02/15/15.  O2 was normal on RA at that visit (during ambulation).  Hydralazine was added for management of CHF.  CXR 3/10 demonstrated no acute findings.  She saw Dr. Annamaria Boots for Pulmonology consult 3/24.  PFTs, 6 minute walk test, overnight oximetry are pending.  She returns for FU.  She is mostly bothered by myalgias.  She describes diffuse myalgias over the past 2-3 months.  She notes leg, arm, neck, back and chest symptoms.  She seems to describe some shoulder girdle stiffness.  She thinks she may have had a recent fever. Has noted some HAs.  She denies any other types of chest pain.  Breathing is about the same. She is NYHA 2b-3.  She sleeps on 3 pillows chronically.  She denies PND.  She denies edema.  She denies syncope.     Studies: - LHC (04/2008): Normal coronary arteries. - Echo (05/03/12): Severe LVH  with mid cavity obliteration during systole, EF 60-65%, moderate LAE, PASP 32. - Echo (7/15): Severe LVH, EF 40-45%, no RWMA, restrictive physiology, septal dyssynergy, sept-lat and apical-basal dyssynchrony, mild MR, mod to severe LAE, mod reduced RVSF, mod TR, PASP 65 mmHg - Nuclear (08/1999): No ischemia  - Myoview 09/2014:  Small fixed defect in inferolateral region and small apical defect consistent with scar and possible soft tissue attenuation. No evidence for ischemia. EF 42%. Low Risk    Past Medical History  Diagnosis Date  . Angioedema     2/2 ACE  . RLS (restless legs syndrome)     Dx 06/2007  . Anemia   . Arteriovenous malformation of stomach   . Permanent atrial fibrillation   . Renal failure     baseline creatinine 1.6  . Hyperlipidemia   . Depression   . Diastolic heart failure   . Arthritis   . Sleep apnea   . Asthma   . Hx of colonic polyp 08/13/10    adenomatous  . Fatty liver 07/26/10  . Panic attacks   . Hypertension   . Complete heart block     s/p PPM 1998  . Hypertrophic cardiomyopathy     dx by Dr [name] 2009  . Hx of colonoscopy   . GI bleed   . Panic attack   . DDD (degenerative disc disease)   . History of alcohol abuse Stopped Fall  2012  . History of tobacco use Quit Fall 2012  . Shortness of breath     sob on exertation  . CHF (congestive heart failure)   . Angina   . GERD (gastroesophageal reflux disease)   . AVM (arteriovenous malformation) of colon     small intestine; stomach  . Right arm pain 01/08/2012  . Hyperlipidemia   . Blood transfusion   . Hx of cardiovascular stress test     a. Lexiscan Myoview (10/15):  Small inferolateral and apical defect c/w scar and poss soft tissue attenuation, no ischemia, EF 42%    Past Surgical History  Procedure Laterality Date  . Tubal ligation  04/01/2000  . Pacemaker insertion  [place], most recent gen change by [name] 4/12  . Cardiac catheterization    . Esophagogastroduodenoscopy   12/23/2011    Procedure: ESOPHAGOGASTRODUODENOSCOPY (EGD);  Surgeon: [name], MD;  Location: [facility];  Service: Endoscopy;  Laterality: N/A;  . Givens capsule study  12/23/2011    Procedure: GIVENS CAPSULE STUDY;  Surgeon: [name], MD;  Location: [facility];  Service: Endoscopy;  Laterality: N/A;     Current Outpatient Prescriptions  Medication Sig Dispense Refill  . ALPRAZolam (XANAX) 0.5 MG tablet Take 1 tablet (0.5 mg total) by mouth as needed for sleep or anxiety. 15 tablet 0  . aspirin EC 81 MG tablet Take 81 mg by mouth daily. Pt states she sometimes takes 1/2 of a 325 mg instead of 81 mg daily    . Calcium Carb-Cholecalciferol (CALCIUM-VITAMIN D) 600-400 MG-UNIT TABS Take 1 tablet by mouth 2 (two) times daily. 60 tablet 12  . carvedilol (COREG) 25 MG tablet Take 1 tablet (25 mg total) by mouth 2 (two) times daily. 60 tablet 11  . esomeprazole (NEXIUM) 40 MG capsule TAKE ONE CAPSULE BY MOUTH EVERY MORNING BEFORE BREAKFAST 90 capsule 3  . ferrous sulfate 325 (65 FE) MG tablet TAKE 1 TABLET BY MOUTH TWICE DAILY WITH MEALS TO KEEP BLOOD COUNT UP 60 tablet 11  . furosemide (LASIX) 20 MG tablet Take 1 tablet (20 mg total) by mouth daily. Except for Monday, Wednesday and Friday take 40 mg by mouth daily. 135 tablet 3  . hydrALAZINE (APRESOLINE) 10 MG tablet Take 1 tablet (10 mg total) by mouth 2 (two) times daily. 60 tablet 11  . lidocaine-prilocaine (EMLA) cream Use as directed twice daily 30 g 1  . loratadine (CLARITIN) 10 MG tablet Take 1 tablet (10 mg total) by mouth daily. 30 tablet 11  . nitroGLYCERIN (NITROSTAT) 0.4 MG SL tablet Place 1 tablet (0.4 mg total) under the tongue every 5 (five) minutes as needed for chest pain. 25 tablet 1  . senna (SENOKOT) 8.6 MG tablet Take 1 tablet by mouth daily.    . sucralfate (CARAFATE) 1 G tablet TAKE 1 TABLET BY MOUTH TWICE DAILY 60 tablet 11  . triamcinolone cream (KENALOG) 0.1 % APPLY TOPICALLY TWICE DAILY 30 g 0  . rosuvastatin  (CRESTOR) 20 MG tablet Take 20 mg by mouth daily. Take 1 tablet Monday, Wednesday, and Friday. Pt states she takes 1/2 tablet on Tuesday & Thursday (06/07/14)     Current Facility-Administered Medications  Medication Dose Route Frequency Provider Last Rate Last Dose  . cyanocobalamin ((VITAMIN B-12)) injection 1,000 mcg  1,000 mcg Intramuscular Q30 days [name], MD   1,000 mcg at 09/16/11 1341    Allergies:   Ace inhibitors    Social History:  The patient  reports that she quit smoking about 3 years ago. Her smoking use included Cigarettes. She has a 5 pack-year smoking history. She has never used smokeless tobacco. She reports that she does not drink alcohol or use illicit drugs.   Family History:  The patient's family history includes Alcohol abuse in her brother and father; Cancer in her brother; Colon cancer in her brother; Diabetes in her brother and sister; Heart attack in her brother and father; Heart disease in her mother and sister; Hypertension in her brother, father, mother, and sister; Stroke in her father and mother.    ROS:   Please see the history of present illness.   Review of Systems  Constitution: Positive for chills, decreased appetite, diaphoresis, fever and weight gain.  HENT: Positive for headaches.   Cardiovascular: Positive for chest pain, irregular heartbeat and orthopnea.  Respiratory: Positive for cough and wheezing.   Skin: Positive for rash.  Musculoskeletal: Positive for back pain, joint swelling and myalgias.  Gastrointestinal: Positive for constipation, nausea and vomiting.  Neurological: Positive for dizziness and loss of balance.  Psychiatric/Behavioral: The patient is nervous/anxious.   All other systems reviewed and are negative.     PHYSICAL EXAM: VS:  Ht '5\' 5"'  (1.651 m)  Wt 181 lb 12.8 oz (82.464 kg)  BMI 30.25 kg/m2    Wt Readings from Last 3 Encounters:  04/05/15 181 lb 12.8 oz (82.464 kg)  03/28/15 184 lb (83.462 kg)  03/08/15 181  lb 6.4 oz (82.283 kg)     GEN: Well nourished, well developed, in no acute distress HEENT: normal Neck: no JVD, no masses Cardiac:  Normal S1/S2, RRR; no murmur ,  no rubs or gallops, no edema  Respiratory:  clear to auscultation bilaterally, no wheezing, rhonchi or rales. GI: soft, nontender, nondistended, + BS MS: no deformity or atrophy Skin: warm and dry  Neuro:  CNs II-XII intact, Strength and sensation are intact Psych: Normal affect  EKG:  EKG is ordered today.  It demonstrates:   V paced, HR 70   Recent Labs: 12/26/2014: ALT 14 01/18/2015: Hemoglobin 10.1*; Platelets 222.0 02/15/2015: Pro B Natriuretic peptide (BNP) 363.0* 03/02/2015: BUN 21; Creatinine 1.35*; Potassium 3.8; Sodium 137    Recent Labs  07/24/14 1153  09/27/14 1649 12/26/14 1209 01/18/15 1538 02/15/15 1502 02/23/15 1354 03/02/15 1205  PROBNP  --   --  159.0*  --  537.0* 363.0*  --   --   K  --   < > 4.7 4.1 3.6 4.5 4.3 3.8  BUN  --   < > 34* '22 21 23 ' 29* 21  CREATININE  --   < > 1.6* 1.13* 1.25* 1.27* 1.39* 1.35*  HGB 10.4*  --   --  10.6* 10.1*  --   --   --   < > = values in this interval not displayed.   Lipid Panel    Component Value Date/Time   CHOL 122 03/28/2015 1504   TRIG 86 03/28/2015 1504   HDL 42* 03/28/2015 1504   CHOLHDL 2.9 03/28/2015 1504   VLDL 17 03/28/2015 1504   LDLCALC 63 03/28/2015 1504   LDLDIRECT 161.8 02/21/2013 1416      ASSESSMENT AND PLAN:  Myalgias Etiology of her symptoms are not clear.  She has recently held her statin (Crestor) without much benefit.  She has some shoulder girdle stiffness.  Question PMR.  Hydralazine is a new drug.  I wonder if she is experiencing a Lupus Like Syndrome.   -  Continue to hold statin.  -  Hold Hydralazine.  -  Labs today:  Total CK, ESR, CRP, TSH, ANA, RF, ANCA, BMET.  If Autoimmune serologies abnormal, refer back to PCP.  (if ANCA abnormal, will need to avoid Hydralazine in the future).    SOB (shortness of breath):   Workup  with pulmonary is ongoing.  Symptoms are stable.  Weights are stable.   Hypertrophic cardiomyopathy:   Her EF has reduced to 40-45%.  She is on max dose beta blocker.  She has a true allergy to ACEI.  Hold Hydralazine for now as noted.  If workup neg for Lupus Like Syndrome, we can try to resume it.  Hypertension:   Controlled. I have asked her to monitor her BP at home with stopping the Hydralazine.    Chronic Combined Systolic and Diastolic heart failure:   Volume stable.  Continue current Rx.   HLD (hyperlipidemia):   Hold Crestor.  Consider low dose Pravastatin 2-3 days a week in the future.    Complete heart block s/p PACEMAKER-[place]:   FU with EP as planned.   CHRONIC KIDNEY DISEASE STAGE III (MODERATE):   BMET today.  Chronic atrial fibrillation:   Rate controlled.  She is not a candidate for anticoagulation due to prior GI bleeding.    Current medicines are reviewed at length with the patient today.  The patient has concerns regarding medicines.  The following changes have been made:    Hold Crestor  Hold Hydralazine    Labs/ tests ordered today include:  Orders Placed This Encounter  Procedures  . Basic metabolic panel  . CK (Creatine Kinase)  . Sed Rate (ESR)  . C-reactive protein  . TSH  . Antinuclear Antib (ANA)  . Rheumatoid Factor  . ANCA Screen Reflex Titer  . EKG 12-Lead    Disposition:   FU Dr. Thompson Grayer 3 mos   Signed, Versie Starks, MHS 04/05/2015 2:09 PM    [facility] [place], [place], [place]  [address] Phone: [phone]; Fax: [phone]

## 2015-04-05 NOTE — Patient Instructions (Signed)
Medication Instructions:  Stop taking Hydralazine  Labwork: Today BMET Total CK ESR CRP TSH ANA RF ANCA (antineutrophil cytoplasmic antibody)  Testing/Procedures: None   Follow-Up: Dr. Thompson Grayer 3 months.  Any Other Special Instructions Will Be Listed Below (If Applicable).  Check BP daily.  Call if BP greater than 140/90 every time you check it.

## 2015-04-05 NOTE — Telephone Encounter (Signed)
Tried to call patient without success.  It sounds like a machine picks up but then it beeps and disconnects.  She is seeing PA at 2pm.  Message states she has been having this pain for one month

## 2015-04-06 ENCOUNTER — Other Ambulatory Visit: Payer: Self-pay | Admitting: *Deleted

## 2015-04-06 LAB — ANCA SCREEN W REFLEX TITER
Atypical p-ANCA Screen: NEGATIVE
C-ANCA SCREEN: NEGATIVE
p-ANCA Screen: POSITIVE — AB

## 2015-04-06 LAB — RHEUMATOID FACTOR

## 2015-04-06 LAB — ANCA TITERS

## 2015-04-06 LAB — ANA: Anti Nuclear Antibody(ANA): NEGATIVE

## 2015-04-09 ENCOUNTER — Encounter: Payer: Self-pay | Admitting: Family Medicine

## 2015-04-09 ENCOUNTER — Ambulatory Visit (INDEPENDENT_AMBULATORY_CARE_PROVIDER_SITE_OTHER): Payer: Medicare Other | Admitting: Family Medicine

## 2015-04-09 ENCOUNTER — Telehealth: Payer: Self-pay | Admitting: Physician Assistant

## 2015-04-09 VITALS — BP 135/84 | HR 69 | Temp 98.3°F | Ht 65.0 in | Wt 180.0 lb

## 2015-04-09 DIAGNOSIS — M2560 Stiffness of unspecified joint, not elsewhere classified: Secondary | ICD-10-CM

## 2015-04-09 MED ORDER — PREDNISONE 20 MG PO TABS: 20.0000 mg | ORAL_TABLET | Freq: Every day | ORAL | Status: DC

## 2015-04-09 NOTE — Telephone Encounter (Signed)
Calling stating she is still having pain across shoulders and down into back.  States was told on Thursday by Margaret Pyle if pain not any better to call us back.  States she did stop Crestor and Hydralazine. Has had pain off and on for several months. States pain worse in AM when gets up and when she goes to bed at night.  Unable to reach up to comb hair due to pain-has to take other arm to force (R) arm up.  Spoke w[place] who suggests that she call Dr. Lamar Benes.  Has an appointment 5/5 but advised to try and call them to see if can be seen this week. Her sed rate was elevated and labs were sent to Dr. Lamar Benes on Friday. She will call them for recommendations and to see if can be seen this week.

## 2015-04-09 NOTE — Patient Instructions (Signed)
Nice to meet you. You possibly have polymyalgia rheumatica. This is treated with prednisone. This should help in the next several days.  If this does not improve, or you develop worsening pain, fevers, or fatigue please seek medical attention.

## 2015-04-09 NOTE — Telephone Encounter (Signed)
New problem   Pt came in to see [name] 4.21.16 and stated she was told by [name] to call back if she still had her same pain which is in both shoulder. Pt stated the pain hasn't stopped. Please advise pt.

## 2015-04-10 ENCOUNTER — Ambulatory Visit: Payer: Medicare Other | Admitting: Family Medicine

## 2015-04-10 ENCOUNTER — Encounter: Payer: Medicare Other | Admitting: Internal Medicine

## 2015-04-10 DIAGNOSIS — M353 Polymyalgia rheumatica: Secondary | ICD-10-CM | POA: Insufficient documentation

## 2015-04-10 DIAGNOSIS — J449 Chronic obstructive pulmonary disease, unspecified: Secondary | ICD-10-CM

## 2015-04-10 LAB — PULMONARY FUNCTION TEST
DL/VA % PRED: 68 %
DL/VA: 3.27 ml/min/mmHg/L
DLCO UNC % PRED: 50 %
DLCO UNC: 12.26 ml/min/mmHg
FEF 25-75 PRE: 0.95 L/s
FEF 25-75 Post: 0.76 L/sec
FEF2575-%Change-Post: -20 %
FEF2575-%PRED-POST: 46 %
FEF2575-%Pred-Pre: 57 %
FEV1-%Change-Post: -5 %
FEV1-%PRED-POST: 64 %
FEV1-%PRED-PRE: 68 %
FEV1-POST: 1.16 L
FEV1-PRE: 1.23 L
FEV1FVC-%Change-Post: 0 %
FEV1FVC-%PRED-PRE: 98 %
FEV6-%CHANGE-POST: -5 %
FEV6-%Pred-Post: 68 %
FEV6-%Pred-Pre: 72 %
FEV6-PRE: 1.61 L
FEV6-Post: 1.52 L
FEV6FVC-%Change-Post: 0 %
FEV6FVC-%Pred-Post: 104 %
FEV6FVC-%Pred-Pre: 103 %
FVC-%Change-Post: -5 %
FVC-%PRED-POST: 65 %
FVC-%Pred-Pre: 69 %
FVC-PRE: 1.62 L
FVC-Post: 1.52 L
Post FEV1/FVC ratio: 76 %
Post FEV6/FVC ratio: 100 %
Pre FEV1/FVC ratio: 76 %
Pre FEV6/FVC Ratio: 99 %
RV % PRED: 116 %
RV: 2.58 L
TLC % pred: 82 %
TLC: 4.19 L

## 2015-04-10 NOTE — Assessment & Plan Note (Addendum)
Patient with multiple stiff joints with discomfort in setting of elevated ESR is concerning for PMR. Could also be lupus like syndrome related to hydralazine usage. Agree with d/c hydralazine. Will give a trial of prednisone 20 mg daily as this will likely be therapeutic and diagnostic if she has PMR. Advised that if she does not notice a difference in the next several days to a week she should follow-up with her PCP. Given return precautions. F/u in 2 weeks for re-evaluation with PCP.  Precepted with Dr Ree Kida.

## 2015-04-10 NOTE — Progress Notes (Signed)
Patient ID: [name], female   DOB: 02/29/1946, 69 y.o.   MRN: [id number]   , MD Phone: [phone]  [name] is a 69 y.o. female who presents today for same day appointment.  Presents for muscle soreness. Recently seen by cardiology and they ordered lab work that returned with elevated ESR and p-ANCA. Otherwise work up unremarkable. They took her off her hydralazine due to concern for lupus like syndrome. She took herself off statin. She notes she has had intermittent muscle soreness for a couple of years, though has gotten worse in the past 2 months. Is noted in bilateral shoulders and hips with stiffness and discomfort. Is worse in the morning. Also notes low back pain that was associated with a rash that was dry in nature and not vesicular. Back is sore with movement. No injuries. No weakness, numbness, saddle anesthesia, or incontinence. No fevers, though notes she feels hot and cold. Notes some fatigue, constipation, and nausea. No weight changes.  PMH: former smoker.  No longer on hydralazine or statin.   ROS: Per HPI   Physical Exam Filed Vitals:   04/09/15 1544  BP: 135/84  Pulse: 69  Temp: 98.3 F (36.8 C)    Gen: Well NAD HEENT: PERRL,  MMM Lungs: CTABL Nl WOB Heart: RRR  MSK: bilateral shoulders with discomfort on internal and external ROM, mild decrease ROM on external rotation, negative speed testing, positive empty can testing, discomfort with bilateral hip ROM, full ROM bilateral hips, no midline or other back tenderness Skin: small dry patch of skin in mid low back, no vesicles Neuro: 5/5 strength in bilateral biceps, triceps, grip, quads, hamstrings, plantar and dorsiflexion, sensation to light touch intact in bilateral UE and LE, normal gait, 2+ patellar reflexes Exts: Non edematous BL  LE, warm and well perfused.    Assessment/Plan: Please see individual problem list.   , MD [facility]  PGY-3  

## 2015-04-11 ENCOUNTER — Encounter: Payer: Self-pay | Admitting: Internal Medicine

## 2015-04-12 ENCOUNTER — Ambulatory Visit: Payer: Medicare Other

## 2015-04-13 ENCOUNTER — Encounter: Payer: Self-pay | Admitting: Internal Medicine

## 2015-04-19 ENCOUNTER — Encounter: Payer: Self-pay | Admitting: Family Medicine

## 2015-04-19 ENCOUNTER — Ambulatory Visit (INDEPENDENT_AMBULATORY_CARE_PROVIDER_SITE_OTHER): Payer: Medicare Other | Admitting: Family Medicine

## 2015-04-19 VITALS — BP 138/85 | HR 70 | Temp 98.8°F | Ht 65.0 in | Wt 180.0 lb

## 2015-04-19 DIAGNOSIS — Z0000 Encounter for general adult medical examination without abnormal findings: Secondary | ICD-10-CM

## 2015-04-19 NOTE — Progress Notes (Signed)
Subjective:   [name] is a 69 y.o. female who presents for an Initial Medicare Annual Wellness Visit.       Objective:    Today's Vitals   04/19/15 1353  BP: 138/85  Pulse: 70  Temp: 98.8 F (37.1 C)  TempSrc: Oral  Height: 5\' 5"  (1.651 m)  Weight: 180 lb (81.647 kg)  PainSc: 0-No pain    Current Medications (verified) Outpatient Encounter Prescriptions as of 04/19/2015  Medication Sig  . ALPRAZolam (XANAX) 0.5 MG tablet Take 1 tablet (0.5 mg total) by mouth as needed for sleep or anxiety.  [name] aspirin EC 81 MG tablet Take 81 mg by mouth daily. Pt states she sometimes takes 1/2 of a 325 mg instead of 81 mg daily  . Calcium Carb-Cholecalciferol (CALCIUM-VITAMIN [name]) 600-400 MG-UNIT TABS Take 1 tablet by mouth 2 (two) times daily.  . carvedilol (COREG) 25 MG tablet Take 1 tablet (25 mg total) by mouth 2 (two) times daily.  [name] esomeprazole (NEXIUM) 40 MG capsule TAKE ONE CAPSULE BY MOUTH EVERY MORNING BEFORE BREAKFAST  . ferrous sulfate 325 (65 FE) MG tablet TAKE 1 TABLET BY MOUTH TWICE DAILY WITH MEALS TO KEEP BLOOD COUNT UP  . furosemide (LASIX) 20 MG tablet Take 1 tablet (20 mg total) by mouth daily. Except for Monday, Wednesday and Friday take 40 mg by mouth daily.  . nitroGLYCERIN (NITROSTAT) 0.4 MG SL tablet Place 1 tablet (0.4 mg total) under the tongue every 5 (five) minutes as needed for chest pain.  [name] senna (SENOKOT) 8.6 MG tablet Take 1 tablet by mouth daily.  . sucralfate (CARAFATE) 1 G tablet TAKE 1 TABLET BY MOUTH TWICE DAILY  . triamcinolone cream (KENALOG) 0.1 % APPLY TOPICALLY TWICE DAILY  . lidocaine-prilocaine (EMLA) cream Use as directed twice daily (Patient not taking: Reported on 04/19/2015)  . loratadine (CLARITIN) 10 MG tablet Take 1 tablet (10 mg total) by mouth daily. (Patient not taking: Reported on 04/19/2015)  . predniSONE (DELTASONE) 20 MG tablet Take 1 tablet (20 mg total) by mouth daily with breakfast. (Patient not taking: Reported on 04/19/2015)    Facility-Administered Encounter Medications as of 04/19/2015  Medication  . cyanocobalamin ((VITAMIN B-12)) injection 1,000 mcg    Allergies (verified) Ace inhibitors   History: Past Medical History  Diagnosis Date  . Angioedema     2/2 ACE  . RLS (restless legs syndrome)     Dx 06/2007  . Anemia   . Arteriovenous malformation of stomach   . Permanent atrial fibrillation   . Renal failure     baseline creatinine 1.6  . Hyperlipidemia   . Depression   . Diastolic heart failure   . Arthritis   . Sleep apnea   . Asthma   . Hx of colonic polyp 08/13/10    adenomatous  . Fatty liver 07/26/10  . Panic attacks   . Hypertension   . Complete heart block     s/p PPM 1998  . Hypertrophic cardiomyopathy     dx by Dr [name] 2009  . Hx of colonoscopy   . GI bleed   . Panic attack   . DDD (degenerative disc disease)   . History of alcohol abuse Stopped Fall 2012  . History of tobacco use Quit Fall 2012  . Shortness of breath     sob on exertation  . CHF (congestive heart failure)   . Angina   . GERD (gastroesophageal reflux disease)   . AVM (arteriovenous malformation) of  colon     small intestine; stomach  . Right arm pain 01/08/2012  . Hyperlipidemia   . Blood transfusion   . Hx of cardiovascular stress test     a. Lexiscan Myoview (10/15):  Small inferolateral and apical defect c/w scar and poss soft tissue attenuation, no ischemia, EF 42%  . Iron deficiency anemia    Past Surgical History  Procedure Laterality Date  . Tubal ligation  04/01/2000  . Pacemaker insertion  [place], most recent gen change by [name] 4/12  . Cardiac catheterization    . Esophagogastroduodenoscopy  12/23/2011    Procedure: ESOPHAGOGASTRODUODENOSCOPY (EGD);  Surgeon: [name], MD;  Location: [facility];  Service: Endoscopy;  Laterality: N/A;  . Givens capsule study  12/23/2011    Procedure: GIVENS CAPSULE STUDY;  Surgeon: [name], MD;  Location: [facility];  Service:  Endoscopy;  Laterality: N/A;   Family History  Problem Relation Age of Onset  . Hypertension Mother   . Stroke Mother   . Hypertension Father   . Stroke Father   . Hypertension Sister   . Hypertension Brother   . Colon cancer Brother   . Diabetes Sister   . Diabetes Brother   . Cancer Brother   . Heart attack Brother   . Heart attack Father   . Heart disease Mother   . Heart disease Sister   . Alcohol abuse Brother   . Alcohol abuse Father   . Aneurysm Mother   . CVA Mother    Social History   Occupational History  . disabled   . previously- house cleaning    Social History Main Topics  . Smoking status: Former Smoker -- 0.10 packs/day for 50 years    Types: Cigarettes    Quit date: 08/16/2011  . Smokeless tobacco: Never Used     Comment: quit 2012  . Alcohol Use: No     Comment: quit 08/16/2011  . Drug Use: No  . Sexual Activity: Not on file    Tobacco Counseling Counseling given: Not Answered Quit two years ago  Activities of Daily Living In your present state of health, do you have any difficulty performing the following activities: 04/19/2015  Hearing? N  Vision? N  Difficulty concentrating or making decisions? Y  Walking or climbing stairs? Y  Dressing or bathing? N  Doing errands, shopping? N    Immunizations and Health Maintenance Immunization History  Administered Date(s) Administered  . Influenza Split 01/08/2012  . Pneumococcal Conjugate-13 04/27/2014  . Pneumococcal Polysaccharide-23 12/23/2010  . Td 03/15/2004   Health Maintenance Due  Topic Date Due  . ZOSTAVAX  01/11/2004  . TETANUS/TDAP  03/15/2014    Patient Care Team: Leone Brand, MD as PCP - General [name], MD (Gastroenterology) Thompson Grayer, MD (Cardiology)  Indicate any recent Medical Services you may have received from other than [facility] providers in the past year (date may be approximate).     Assessment:   This is a routine wellness examination for [name].    Hearing/Vision screen: No impairment by history No exam data present  Dietary issues and exercise activities discussed:  No exercise  Goals    . Exercise 150 minutes per week (moderate activity)      Depression Screen PHQ 2/9 Scores 04/19/2015 04/09/2015 03/28/2015 12/26/2014 11/29/2014 04/27/2014 04/04/2014  PHQ - 2 Score 1 0 0 1 0 0 2    Fall Risk Fall Risk  04/19/2015 04/09/2015 03/28/2015  04/27/2014 04/04/2014  Falls in the past year? No No No No No    Cognitive Function: MMSE - Mini Mental State Exam 04/04/2014  Orientation to time 5  Orientation to Place 5  Registration 3  Attention/ Calculation 0  Recall 0  Language- name 2 objects 2  Language- repeat 1  Language- follow 3 step command 3  Language- read & follow direction 1  Write a sentence 1  Copy design 1  Total score 22    Screening Tests Health Maintenance  Topic Date Due  . ZOSTAVAX  01/11/2004  . TETANUS/TDAP  03/15/2014  . MAMMOGRAM  05/31/2015  . INFLUENZA VACCINE  07/16/2015  . LIPID PANEL  03/27/2016  . COLONOSCOPY  08/24/2021  . DEXA SCAN  Completed  . PNA vac Low Risk Adult  Completed      Plan:    During the course of the visit, [name] was educated and counseled about the following appropriate screening and preventive services:   Glaucoma screening  Patient Instructions (the written plan) were given to the patient.    [name] Mirelez D, MD   04/19/2015

## 2015-04-19 NOTE — Patient Instructions (Signed)
Risk Factors Labs  Random Glucose (if age 71 to 70 years old)  Screening CT Chest Low-dose screening Lung Cancer  Medications  Vitamin D Supplementation (Community-dwelling Adults, 54 Years or Older, at Increased Risk for Falls)   Vaccinations Recommended Tdap (Medicare Part D coverage) Zostavax (Medicare Part D coverage)   Referrals for Risk Factor education and management Ophthalmology for glaucoma screening Hyperglycemia needs screening for Diabetes Poor oral health: needs referral for dental care   Other referrals based on screening results:  Physical Inactivity: Give information about community resources for physical activity  Producer, television/film/video Information Description of Services Cost  A Matter of Balance Class locations vary. Call [place] on Aging for more information.  http://dawson-may.com/ [phone] 8-Session program addressing the fear of falling and increasing activity levels of older adults Free to minimal cost  A.C.T. By [employer] [address] [place], [place] [address].  BetaBlues.dk [phone]  Personal training, gym, classes including Silver Sneakers* and ACTion for Aging Adults Fee-based  A.H.O.Y. (Add Health to [place]) Airs on Time [employer] 13, M-F at [place]: [employer],  [place] [facility] [facility] [place],  [place], [place] [facility], [address] [place], [place], [place], [place] [address]  [place] Location: Sharrell Ku. [employer] [place]       [phone]  [phone]  [phone]  [phone]  [phone]  [phone]  [phone]  [phone]  [phone]  [phone]    [phone] A total-body conditioning class for adults 29 and older; designed to increase muscular strength, endurance, range of movement, flexibility, balance, agility and coordination Free  [facility] [place], [place] [address] [place]  [address]  [phone]  Pilate's class for individualsreturning to exercise after an injury, before or after surgery or for individuals with complex musculoskeletal issues; designed to improve strength, balance , flexibility  $15/class  [place] [address] [place], [place] [address] www.[email or web address] [place] classes for beginners to advanced [place], [place] [address] [email or web address]-[email or web address].[email or web address] [place] Chair Exercises Free, ages 41 and older; Ages 33-59 fee based  Marvia Pickles, [facility] [address] [place], [place] [address] [email or web address] .Beverlee Nims [phone]  A.H.O.Y. Tai Chi Fee-based Donation based or free  Orient Class locations vary.  Call or email Angela Burke or view website for more information. [email or web address] .[email or web address] GainPain.com.cy.[email or web address] [phone] Ongoing classes at local YMCAs and gyms Fee-based  Silver Sneakers A.C.T. By [place] [employer]: [place] [facility] [place]  [phone]  [phone] Classes designed for older adults who want to improve their strength, flexibility, balance and endurance.   Silver sneakers is covered by some insurance plans and includes a fitness center membership at participating locations. Find out more by calling  [phone] or visiting www.[email or web address] Covered by some insurance plans  [facility] [place] [phone] A.H.O.Y., fitness room, personal training, fitness classes for injury prevention, strength, balance, flexibility, water fitness classes Ages 55+: $55 for 6 months; Ages 17-54: $34 for 6 months  Tai Chi for Everybody [facility] [address] [place], [place] [address] [email or web address] .Patsi Sears [phone] Tai Chi classes for beginners to advanced; geared for seniors Donation Based      [employer] ([place] Others Participate in Exercise     Loyal Gambler. Rosana Hoes, PhD, [place] [email or web address] [place]     [phone]     A comprehensive fitness program for adults.  The program paris senior-level undergraduates Kinesiology students with adults who desire to learn how to exercise safely.  Includes a structural exercise class focusing on functional fitnesss     $100/semester in fall and spring; $75 in summer (no trainers)    *Silver Sneakers is covered by some Personal assistant and includes a  Radio producer at participating locations.  Find out more by calling [phone] or visiting [email or web address]  For additional health and human services resources for senior adults, please contact SeniorLine at [phone] in [place] and [place] at [phone] in all other areas.

## 2015-04-24 ENCOUNTER — Ambulatory Visit (INDEPENDENT_AMBULATORY_CARE_PROVIDER_SITE_OTHER): Payer: Medicare Other | Admitting: Family Medicine

## 2015-04-24 ENCOUNTER — Encounter: Payer: Self-pay | Admitting: Family Medicine

## 2015-04-24 VITALS — BP 114/69 | HR 71 | Temp 98.0°F | Ht 65.0 in | Wt 180.0 lb

## 2015-04-24 DIAGNOSIS — M2560 Stiffness of unspecified joint, not elsewhere classified: Secondary | ICD-10-CM | POA: Diagnosis not present

## 2015-04-24 MED ORDER — PREDNISONE 10 MG PO TABS: 10.0000 mg | ORAL_TABLET | Freq: Every day | ORAL | Status: DC

## 2015-04-24 NOTE — Progress Notes (Signed)
Patient ID: [name], female   DOB: 09-13-1946, 69 y.o.   MRN: [id number] Subjective:   CC: Follow-up back and arm pain   HPI:   Patient presents for follow-up of joint stiffness. ESR at 4/25 visit concerning for PMR (69) vs lupus-like syndrome given hydralazine use. D/c'ed hydralazine, trialed prdnisone 52m daily. Patient has only been taking 10 mg daily because she worries that a whole tablet will contribute to her "heart flutter". She denies any weakness. Pain and stiffness has significantly improved and she is now able to brush hair though still with some soreness. Denies any SE from prednisone. She denies any fevers, chills, redness, or swelling.    Review of Systems - Per HPI. Additionally, she reports 3 years of "heart flutter" that seems to have been getting more frequent recently. She has tried spreading all of her medications and thinks this helps. [facility]  PMH - CAD, anemia, anxiety, sleep apnea, AVM, arthritis, atrial fibrillation, diastolic heart failure, complete heart block, COPD, debilitated, eczema, gout, hyperlipidemia, hypertension, hypertrophic cardiomyopathy, pacemaker, prediabetes    Objective:  Physical Exam BP 114/69 mmHg  Pulse 71  Temp(Src) 98 F (36.7 C) (Oral)  Ht '5\' 5"'  (1.651 m)  Wt 180 lb (81.647 kg)  BMI 29.95 kg/m2 GEN: NAD Extremities: No tenderness or swelling or deformity at shoulder girdle, C-spine, or lower extremities bilaterally Shoulder forward flexion and abduction 160 degrees bilaterally with mild stiffness and pain at this level. No lower extremity edema or calf tenderness Pulmonary: Normal effort     Assessment:     [name] Hillmanis a 69y.o. female here for follow-up of joint stiffness.    Plan:     # See problem list and after visit summary for problem-specific plans. -Follow-up with cardiology as scheduled with reported increased palpitations, or immediately if any chest pain,  dizziness or dyspnea.   # Health Maintenance: Not discussed  Follow-up: Follow up in 4-8 weeks for follow up of likely PMR.   MHilton Sinclair MD [place]

## 2015-04-24 NOTE — Assessment & Plan Note (Addendum)
Shoulder girdle pain most likely PMR given elevated ESR and improvement with prednisone started 4/25 and stopping hydralazine (due to concern for lupus-like syndrome). -Continue prednisone 10 mg daily. Refill. Monitor for any adverse effect on fluid status. -Follow-up in 1 to 2 months with PCP. -Discussed staying active, asked patient to consider pool classes at [facility] or a closer location.

## 2015-04-24 NOTE — Patient Instructions (Signed)
For your arm pain, this sounds like polymyalgia rheumatica.  I'm glad it improved with the prednisone. Continue taking prednisone 10 mg daily. Follow-up in 4-8 weeks with Dr. Lamar Benes. Work on staying active, consider going to pool classes at the [employer] center. Follow-up with your cardiologist as scheduled and let them know that you have had more heart fluttering recently.  Hilton Sinclair, MD  Polymyalgia Rheumatica Polymyalgia rheumatica (also called PMR or polymyalgia) is a rheumatologic (arthritic) condition that causes pain and morning stiffness in your neck, shoulders, and hips. It is an inflammatory condition. In some people, inflammation of certain structures in the shoulder, hips, or other joints can be seen on special testing. It does not cause joint destruction, as occurs in other arthritic conditions. It usually occurs after 71 years of age, and is more common as you age. It can be confused with several other diseases, but it is usually easily treated. People with PMR often have, or can develop, a more severe rheumatologic condition called giant cell arteritis (also called CGA or temporal arteritis).  CAUSES  The exact cause of PMR is not known.   There are genetic factors involved.  Viruses have been suspected in the cause of PMR. This has not been proven. SYMPTOMS   Aching, pain, and morning stiffness your neck, both shoulders, or both hips.  Symptoms usually start slowly and build gradually.  Morning stiffness usually lasts at least 30 minutes.  Swelling and tenderness in other joints of the arms, hands, legs, and feet may occur.  Swelling and inflammation in the wrists can cause nerve inflammation at the wrist (carpal tunnel syndrome).  You may also have low grade fever, fatigue, weakness, decreased appetite and weight loss. DIAGNOSIS   Your caregiver may suspect that you have PMR based on your description of your symptoms and on your exam.  Your caregiver  will examine you to be sure you do not have diseases that can be confused with PMR. These diseases include rheumatoid arthritis, fibromyalgia, or thyroid disease.  Your caregiver should check for signs of giant cell arteritis. This can cause serious complications such as blindness.  Lab tests can help confirm that you have PMR and not other diseases, but are sometimes inconclusive.  X-rays cannot show PMR. However, it can identify other diseases like rheumatoid arthritis. Your caregiver may have you see a specialist in arthritis and inflammatory diseases (rheumatologist). TREATMENT  The goal of treatment is relief of symptoms. Treatment does not shorten the course of the illness or prevent complications. With proper treatment, you usually feel better almost right away.   The initial treatment of PMR is usually a cortisone (steroid) medication. Your caregiver will help determine a starting dose. The dose is gradually reduced every few weeks to months. Treatment usually lasts one to three years.  Other stronger medications are rarely needed. They will only be prescribed if your symptoms do not get better on cortisone medication alone, or if they recur as the dose is reduced.  Cortisone medication can have different side effects. With the doses of cortisone needed for PMR, the side effects can affect bones and joints, blood sugar control in diabetes, and mood changes. Discuss this with your caregiver.  Your caregiver will evaluate you regularly during your treatment. They will do this in order to assess progress and to check for complications of the illness or treatment.  Physical therapy is sometimes useful. This is especially true if your joints are still stiff after other symptoms  have improved. HOME CARE INSTRUCTIONS   Follow your caregiver's instructions. Do not change your dose of cortisone medication on your own.  Keep your appointments for follow-up lab tests and caregiver visits. Your  lab tests need to be monitored. You must get checked periodically for giant cell arteritis.  Follow your caregiver's guidance regarding physical activity (usually no restrictions are needed) or physical therapy.  Your caregiver may have instructions to prevent or check for side effects from cortisone medication (including bone density testing or treatment). Follow their instructions carefully. SEEK MEDICAL CARE IF:   You develop any side effects from treatment. Side effects can include:  Elevated blood pressure.  High blood sugar (or worsening of diabetes, if you are diabetic).  Difficulty fighting off infections.  Weight gain.  Weakness of the bones (osteoporosis).  Your aches, pains, morning stiffness, or other symptoms get worse with time. This is especially true after your dose of cortisone is reduced.  You develop new joint symptoms (pain, swelling, etc.) SEEK IMMEDIATE MEDICAL CARE IF:   You develop a severe headache.  You start vomiting.  You have problems with your vision.  You have an oral temperature above 102 F (38.9 C), not controlled by medicine. Document Released: 01/08/2005 Document Revised: 11/17/2012 Document Reviewed: 04/23/2009 [facility] Patient Information 2015 [place], [place]. This information is not intended to replace advice given to you by your health care provider. Make sure you discuss any questions you have with your health care provider.

## 2015-04-30 ENCOUNTER — Ambulatory Visit (INDEPENDENT_AMBULATORY_CARE_PROVIDER_SITE_OTHER): Payer: Medicare Other | Admitting: *Deleted

## 2015-04-30 DIAGNOSIS — I482 Chronic atrial fibrillation, unspecified: Secondary | ICD-10-CM

## 2015-04-30 DIAGNOSIS — I442 Atrioventricular block, complete: Secondary | ICD-10-CM

## 2015-04-30 LAB — CUP PACEART INCLINIC DEVICE CHECK
Battery Impedance: 1000 Ohm — CL
Date Time Interrogation Session: 20160516174546
Lead Channel Pacing Threshold Amplitude: 1 V
Lead Channel Pacing Threshold Pulse Width: 0.5 ms
Lead Channel Setting Pacing Amplitude: 2.5 V
Lead Channel Setting Pacing Pulse Width: 0.5 ms
Lead Channel Setting Sensing Sensitivity: 8 mV
MDC IDC MSMT BATTERY REMAINING LONGEVITY: 108 mo
MDC IDC MSMT BATTERY VOLTAGE: 2.78 V
MDC IDC MSMT LEADCHNL RV IMPEDANCE VALUE: 431 Ohm
Pulse Gen Serial Number: 2406848

## 2015-04-30 NOTE — Progress Notes (Signed)
Pacemaker check in clinic. Normal device function. Threshold and impedance consistent with previous measurements. Device programmed to maximize longevity. Permanent AF + ASA---no A/C due to GI bleed. No high ventricular rates noted. Device programmed at appropriate safety margins. Histogram distribution appropriate for patient activity level. Device programmed to optimize intrinsic conduction. Estimated longevity 9 years. Patient will follow up with JA on 7/25 @ 1215.

## 2015-05-10 ENCOUNTER — Other Ambulatory Visit: Payer: Self-pay | Admitting: Family Medicine

## 2015-05-15 ENCOUNTER — Encounter: Payer: Self-pay | Admitting: Internal Medicine

## 2015-05-18 ENCOUNTER — Other Ambulatory Visit: Payer: Self-pay | Admitting: Family Medicine

## 2015-05-21 ENCOUNTER — Other Ambulatory Visit: Payer: Self-pay | Admitting: Family Medicine

## 2015-05-22 NOTE — Telephone Encounter (Signed)
Script called into pharmacy.  They will contact patient when this is ready. Yoshiye Kraft,CMA

## 2015-06-06 ENCOUNTER — Ambulatory Visit (INDEPENDENT_AMBULATORY_CARE_PROVIDER_SITE_OTHER): Payer: Medicare Other | Admitting: Family Medicine

## 2015-06-06 ENCOUNTER — Encounter: Payer: Self-pay | Admitting: Family Medicine

## 2015-06-06 ENCOUNTER — Other Ambulatory Visit: Payer: Self-pay | Admitting: Family Medicine

## 2015-06-06 VITALS — BP 138/86 | HR 70 | Temp 98.3°F | Ht 65.0 in | Wt 179.3 lb

## 2015-06-06 DIAGNOSIS — I10 Essential (primary) hypertension: Secondary | ICD-10-CM

## 2015-06-06 DIAGNOSIS — E785 Hyperlipidemia, unspecified: Secondary | ICD-10-CM | POA: Diagnosis not present

## 2015-06-06 DIAGNOSIS — M353 Polymyalgia rheumatica: Secondary | ICD-10-CM

## 2015-06-06 DIAGNOSIS — D500 Iron deficiency anemia secondary to blood loss (chronic): Secondary | ICD-10-CM

## 2015-06-06 MED ORDER — TRAZODONE HCL 50 MG PO TABS: 25.0000 mg | ORAL_TABLET | Freq: Every evening | ORAL | Status: DC | PRN

## 2015-06-06 NOTE — Patient Instructions (Addendum)
Restart the Crestor 20mg . Start with once a week and if doing well for 2 weeks increase to 2 times a week. After 2 weeks increase to Monday, Wednesday, Friday.  Continue the prednisone 10mg .

## 2015-06-12 NOTE — Progress Notes (Signed)
   Subjective:    Patient ID: [name], female    DOB: 29-Sep-1946, 69 y.o.   MRN: [id number]  HPI  CC: follow up  # Hyperlipidemia:  Took a time off of crestor because it was thought she was having myalgias  # PMR  Significant improvement in symptoms on prednisone 10mg   Still gets occasional pain in hip and shoulders ROS: +joint pain.   # Hypertension  Taking meds as prescribed ROS: No dizziness, lightheadedness  # Iron deficiency anemia  Asks about pharmacist recommended iron tablet  Issue followed by GI and was getting IV iron infusions, last one done >56yr ago  Some constipation on current tablet but not a big issue for her  Review of Systems   See HPI for ROS. All other systems reviewed and are negative.  Past medical history, surgical, family, and social history reviewed and updated in the EMR as appropriate. Objective:  BP 138/86 mmHg  Pulse 70  Temp(Src) 98.3 F (36.8 C) (Oral)  Ht 5\' 5"  (1.651 m)  Wt 179 lb 5 oz (81.336 kg)  BMI 29.84 kg/m2 Vitals and nursing note reviewed  General: NAD CV: RRR, nl s1s2 no mrg Resp: CTAB Ext: no edema, WWP, ROM intact x 4 ext  Assessment & Plan:  See Problem List Documentation

## 2015-06-12 NOTE — Assessment & Plan Note (Signed)
Stable. No evidence of issue right now. Has not gotten IV iron infusion recently. Last iron panel was significantly improved but do not feel labs need to be repeated at this time. Can continue oral iron supplement. F/u as needed.

## 2015-06-12 NOTE — Assessment & Plan Note (Signed)
Stable. With new diagnosis of PMR, wondering if this was the pain she was having rather than statin myopathy. Will restart crestor at once weekly and titrate up. F/u 3 months.

## 2015-06-12 NOTE — Assessment & Plan Note (Signed)
Symptoms controlled on 10mg  prednisone. Will continue this dose for several months before trial down (particularly holding off on titrating down with restart of crestor). Likely diagnosis of PMR.

## 2015-06-12 NOTE — Assessment & Plan Note (Signed)
At goal and stable. Continue current regimen.

## 2015-06-21 ENCOUNTER — Other Ambulatory Visit: Payer: Self-pay | Admitting: *Deleted

## 2015-06-22 NOTE — Telephone Encounter (Signed)
2nd request.  Alecea Trego L, RN  

## 2015-06-25 ENCOUNTER — Other Ambulatory Visit: Payer: Self-pay | Admitting: Physician Assistant

## 2015-06-25 ENCOUNTER — Other Ambulatory Visit: Payer: Self-pay

## 2015-06-25 DIAGNOSIS — I10 Essential (primary) hypertension: Secondary | ICD-10-CM

## 2015-06-25 DIAGNOSIS — I5032 Chronic diastolic (congestive) heart failure: Secondary | ICD-10-CM

## 2015-06-25 MED ORDER — CARVEDILOL 25 MG PO TABS: 25.0000 mg | ORAL_TABLET | Freq: Two times a day (BID) | ORAL | Status: DC

## 2015-06-25 NOTE — Telephone Encounter (Signed)
3rd request. Taylar Hartsough L, RN  

## 2015-06-26 MED ORDER — ALPRAZOLAM 0.5 MG PO TABS: ORAL_TABLET | ORAL | Status: DC

## 2015-06-26 NOTE — Telephone Encounter (Signed)
Pt is aware.  Jazmin Hartsell,CMA  

## 2015-07-03 ENCOUNTER — Encounter: Payer: Self-pay | Admitting: Internal Medicine

## 2015-07-04 ENCOUNTER — Other Ambulatory Visit: Payer: Self-pay

## 2015-07-09 ENCOUNTER — Encounter: Payer: Self-pay | Admitting: Internal Medicine

## 2015-07-09 ENCOUNTER — Ambulatory Visit (INDEPENDENT_AMBULATORY_CARE_PROVIDER_SITE_OTHER): Payer: Medicare Other | Admitting: Internal Medicine

## 2015-07-09 ENCOUNTER — Other Ambulatory Visit: Payer: Self-pay

## 2015-07-09 VITALS — BP 126/80 | HR 69 | Ht 65.0 in | Wt 182.6 lb

## 2015-07-09 DIAGNOSIS — I482 Chronic atrial fibrillation, unspecified: Secondary | ICD-10-CM

## 2015-07-09 DIAGNOSIS — I422 Other hypertrophic cardiomyopathy: Secondary | ICD-10-CM

## 2015-07-09 DIAGNOSIS — Z950 Presence of cardiac pacemaker: Secondary | ICD-10-CM

## 2015-07-09 DIAGNOSIS — I442 Atrioventricular block, complete: Secondary | ICD-10-CM

## 2015-07-09 LAB — CUP PACEART INCLINIC DEVICE CHECK
Date Time Interrogation Session: 20160725132531
Lead Channel Impedance Value: 424 Ohm
Lead Channel Pacing Threshold Amplitude: 1 V
Lead Channel Pacing Threshold Pulse Width: 0.5 ms
Lead Channel Setting Pacing Amplitude: 2.5 V
Lead Channel Setting Sensing Sensitivity: 8 mV
MDC IDC MSMT BATTERY IMPEDANCE: 1000 Ohm — AB
MDC IDC MSMT BATTERY VOLTAGE: 2.78 V
MDC IDC SET LEADCHNL RV PACING PULSEWIDTH: 0.5 ms
MDC IDC STAT BRADY RV PERCENT PACED: 99 % — AB
Pulse Gen Serial Number: 2406848

## 2015-07-09 NOTE — Progress Notes (Signed)
PCP: Tawanna Sat, MD  [name] is a 69 y.o. female who presents today for routine electrophysiology followup.  Since last being seen in our clinic, the patient reports doing reasonably well. Her SOB is stable.  Her concern today is only with musculoskeletal neck pain.  Today, she denies symptoms of palpitations, CP, lower extremity edema, dizziness, presyncope, or syncope.  The patient is otherwise without complaint today.   Past Medical History  Diagnosis Date  . Angioedema     2/2 ACE  . RLS (restless legs syndrome)     Dx 06/2007  . Anemia   . Arteriovenous malformation of stomach   . Permanent atrial fibrillation   . Renal failure     baseline creatinine 1.6  . Hyperlipidemia   . Depression   . Diastolic heart failure   . Arthritis   . Sleep apnea   . Asthma   . Hx of colonic polyp 08/13/10    adenomatous  . Fatty liver 07/26/10  . Panic attacks   . Hypertension   . Complete heart block     s/p PPM 1998  . Hypertrophic cardiomyopathy     dx by Dr [name] 2009  . Hx of colonoscopy   . GI bleed   . Panic attack   . DDD (degenerative disc disease)   . History of alcohol abuse Stopped Fall 2012  . History of tobacco use Quit Fall 2012  . Shortness of breath     sob on exertation  . CHF (congestive heart failure)   . Angina   . GERD (gastroesophageal reflux disease)   . AVM (arteriovenous malformation) of colon     small intestine; stomach  . Right arm pain 01/08/2012  . Hyperlipidemia   . Blood transfusion   . Hx of cardiovascular stress test     a. Lexiscan Myoview (10/15):  Small inferolateral and apical defect c/w scar and poss soft tissue attenuation, no ischemia, EF 42%  . Iron deficiency anemia    Past Surgical History  Procedure Laterality Date  . Tubal ligation  04/01/2000  . Pacemaker insertion  [place], most recent gen change by [name] 4/12  . Cardiac catheterization    . Esophagogastroduodenoscopy  12/23/2011    Procedure:  ESOPHAGOGASTRODUODENOSCOPY (EGD);  Surgeon: [name], MD;  Location: [facility];  Service: Endoscopy;  Laterality: N/A;  . Givens capsule study  12/23/2011    Procedure: GIVENS CAPSULE STUDY;  Surgeon: [name], MD;  Location: [facility];  Service: Endoscopy;  Laterality: N/A;    Current Outpatient Prescriptions  Medication Sig Dispense Refill  . acetaminophen (TYLENOL) 325 MG tablet Take 325 mg by mouth daily as needed (pain).    [name] ALPRAZolam (XANAX) 0.5 MG tablet Take 0.5 mg by mouth daily as needed for anxiety.    [name] aspirin EC 81 MG tablet Take 81 mg by mouth daily. Pt states she sometimes takes 1/2 of a 325 mg instead of 81 mg daily    . Calcium-Vitamin D 600-200 MG-UNIT per tablet Take 1 tablet by mouth as directed.    . carvedilol (COREG) 25 MG tablet Take 1 tablet (25 mg total) by mouth 2 (two) times daily. 60 tablet 1  . esomeprazole (NEXIUM) 40 MG capsule TAKE ONE CAPSULE BY MOUTH EVERY MORNING BEFORE BREAKFAST 90 capsule 3  . ferrous sulfate 325 (65 FE) MG tablet TAKE 1 TABLET BY MOUTH TWICE DAILY WITH MEALS TO KEEP BLOOD COUNT UP 60 tablet  11  . furosemide (LASIX) 20 MG tablet Take 1 tablet (20 mg total) by mouth daily. Except for Monday, Wednesday and Friday take 40 mg by mouth daily. 135 tablet 3  . lidocaine-prilocaine (EMLA) cream Apply 1 application topically daily as needed (pain).    [name] loratadine (CLARITIN) 10 MG tablet Take 10 mg by mouth daily as needed for allergies.    . nitroGLYCERIN (NITROSTAT) 0.4 MG SL tablet Place 1 tablet (0.4 mg total) under the tongue every 5 (five) minutes as needed for chest pain. 25 tablet 1  . Omega-3 Fatty Acids (FISH OIL PO) Take 1 capsule by mouth as directed.    . predniSONE (DELTASONE) 10 MG tablet Take 1 tablet (10 mg total) by mouth daily with breakfast. 30 tablet 1  . rosuvastatin (CRESTOR) 20 MG tablet TAKE 1 TABLET BY MOUTH DAILY ON MONDAY, WEDNESDAY, AND FRIDAY (Patient taking differently: TAKE 1 TABLET BY MOUTH DAILY ON  MONDAY, [place], AND FRIDAY. Patient states she only takes this occasionally because she gets cramps with it) 40 tablet 3  . senna (SENOKOT) 8.6 MG tablet Take 1 tablet by mouth daily.    . sucralfate (CARAFATE) 1 G tablet TAKE 1 TABLET BY MOUTH TWICE DAILY 60 tablet 11  . traZODone (DESYREL) 50 MG tablet TAKE 1/2 TO 1 TABLET(25 TO 50 MG) BY MOUTH AT BEDTIME AS NEEDED FOR SLEEP 90 tablet 0  . triamcinolone cream (KENALOG) 0.1 % Apply 1 application topically 2 (two) times daily as needed (itching).     Current Facility-Administered Medications  Medication Dose Route Frequency Provider Last Rate Last Dose  . cyanocobalamin ((VITAMIN B-12)) injection 1,000 mcg  1,000 mcg Intramuscular Q30 days [name], MD   1,000 mcg at 09/16/11 1341   ROS- all systems are reviewed and negative except as per HPI above  Physical Exam: Filed Vitals:   07/09/15 1205  BP: 126/80  Pulse: 69  Height: 5\' 5"  (1.651 m)  Weight: 82.827 kg (182 lb 9.6 oz)    GEN- The patient is well appearing, alert and oriented x 3 today.   Head- normocephalic, atraumatic Eyes-  Sclera clear, conjunctiva pink Ears- hearing intact Oropharynx- clear Lungs- Clear to ausculation bilaterally, normal work of breathing Chest- pacemaker pocket is well healed Heart- Regular rate and rhythm, (paced) GI- soft, NT, ND, + BS Extremities- no clubbing, cyanosis, or edema  Pacemaker interrogation- reviewed in detail today,  See PACEART report Recent myoview is reviewed with the patient  Assessment and Plan:  1. Complete heart block Normal pacemaker function See Pace Art report  2. Permanent afib Not a candidate for anticoagulation due to prior GI bleeding  3. HTN Stable No change required today  4. Atypical chest pain (chronic) Low risk myoview previously  5. HL She does not tolerate statins but seems to be doing well with crestor at this time.  6. Chronic systolic dysfunction/ hocm--> no gradient Stable She has  chronic SOB.  This is chronic and multifactoral.  I suspect that her chronic lung disease is part of the issue.   She may benefit from [place] with a general cardiologist.  I will refer to Dr Oval Linsey.   I will see in the EP clinic in a year

## 2015-07-09 NOTE — Patient Instructions (Signed)
Medication Instructions:  Your physician recommends that you continue on your current medications as directed. Please refer to the Current Medication list given to you today.   Labwork: None ordered  Testing/Procedures: None ordered  Follow-Up:  You have been referred to Dr Oval Linsey for generral Cardiology ---see in 3-4 months  Your physician wants you to follow-up in: 31 months with Chanetta Marshall, NP You will receive a reminder letter in the mail two months in advance. If you don't receive a letter, please call our office to schedule the follow-up appointment.   Any Other Special Instructions Will Be Listed Below (If Applicable).

## 2015-07-13 ENCOUNTER — Other Ambulatory Visit: Payer: Self-pay | Admitting: Family Medicine

## 2015-07-19 ENCOUNTER — Other Ambulatory Visit: Payer: Self-pay | Admitting: *Deleted

## 2015-07-19 NOTE — Telephone Encounter (Signed)
Opened in Error. [name], [name]

## 2015-07-19 NOTE — Telephone Encounter (Signed)
2nd request. [name], [name]

## 2015-08-27 ENCOUNTER — Other Ambulatory Visit: Payer: Self-pay | Admitting: Family Medicine

## 2015-08-27 ENCOUNTER — Other Ambulatory Visit: Payer: Self-pay | Admitting: Physician Assistant

## 2015-08-27 NOTE — Telephone Encounter (Signed)
Refill request from pharmacy. Will forward to PCP for review. Kden Wagster, CMA. 

## 2015-08-28 NOTE — Telephone Encounter (Signed)
I will call pt and let her know this is available to pickup at the front office.

## 2015-08-29 ENCOUNTER — Ambulatory Visit (HOSPITAL_COMMUNITY)
Admission: RE | Admit: 2015-08-29 | Discharge: 2015-08-29 | Disposition: A | Discharge status: Home / Self Care | Payer: Medicare Other | Source: Ambulatory Visit | Attending: Family Medicine | Admitting: Family Medicine

## 2015-08-29 ENCOUNTER — Ambulatory Visit (INDEPENDENT_AMBULATORY_CARE_PROVIDER_SITE_OTHER): Payer: Medicare Other | Admitting: Family Medicine

## 2015-08-29 ENCOUNTER — Encounter: Payer: Self-pay | Admitting: Family Medicine

## 2015-08-29 VITALS — BP 130/90 | HR 70 | Temp 98.3°F

## 2015-08-29 DIAGNOSIS — R0602 Shortness of breath: Secondary | ICD-10-CM | POA: Diagnosis not present

## 2015-08-29 DIAGNOSIS — J441 Chronic obstructive pulmonary disease with (acute) exacerbation: Secondary | ICD-10-CM | POA: Diagnosis not present

## 2015-08-29 MED ORDER — AZITHROMYCIN 250 MG PO TABS: ORAL_TABLET | ORAL | Status: DC

## 2015-08-29 MED ORDER — PREDNISONE 20 MG PO TABS: 40.0000 mg | ORAL_TABLET | Freq: Every day | ORAL | Status: DC

## 2015-08-29 NOTE — Patient Instructions (Signed)
Thanks for coming in today.   You likely have a very mild COPD exacerbation.   You will take the antibiotic as instructed for 5 total days.   You will also take the prednisone as instructed for 5 total days.   If you worsen in any way, then you should come back to see [place].   If you have any other questions, don't hesitate to call.   Thanks for letting us take care of you.   Sincerely,  Paula Compton, MD Family Medicine - PGY 2

## 2015-08-31 NOTE — Telephone Encounter (Signed)
Pt picked up Rx already. Peregrine Nolt, CMA.

## 2015-09-17 NOTE — Progress Notes (Signed)
Patient ID: [name], female   DOB: [birth year], 69 y.o.   MRN: [id number]   [facility] Aquilla Hacker, MD Phone: [phone]  Subjective:   # Wheezing - Pt. With known copd - cough / sob that is not improving - he has not had fever, chills, chest pain, nausea or vomiting - he has known copd - he continues to smoke - he says that he has recently seen his pulmonologist and was told to follow up here.  - he continues his home copd medications.   All relevant systems were reviewed and were negative unless otherwise noted in the HPI  Past Medical History Reviewed problem list.  Medications- reviewed and updated Current Outpatient Prescriptions  Medication Sig Dispense Refill  . acetaminophen (TYLENOL) 325 MG tablet Take 325 mg by mouth daily as needed (pain).    [name] ALPRAZolam (XANAX) 0.5 MG tablet TAKE 1 TABLET BY MOUTH EVERY DAY AS NEEDED FOR ANXIETY OR SLEEP 15 tablet 1  . aspirin EC 81 MG tablet Take 81 mg by mouth daily. Pt states she sometimes takes 1/2 of a 325 mg instead of 81 mg daily    . azithromycin (ZITHROMAX) 250 MG tablet Take two pills today and then one pill each day until you run out. 6 tablet 0  . Calcium-Vitamin D 600-200 MG-UNIT per tablet Take 1 tablet by mouth as directed.    . carvedilol (COREG) 25 MG tablet TAKE 1 TABLET BY MOUTH TWICE DAILY 60 tablet 5  . esomeprazole (NEXIUM) 40 MG capsule TAKE ONE CAPSULE BY MOUTH EVERY MORNING BEFORE BREAKFAST 90 capsule 3  . ferrous sulfate 325 (65 FE) MG tablet TAKE 1 TABLET BY MOUTH TWICE DAILY WITH MEALS TO KEEP BLOOD COUNT UP 60 tablet 11  . furosemide (LASIX) 20 MG tablet Take 1 tablet (20 mg total) by mouth daily. Except for Monday, Wednesday and Friday take 40 mg by mouth daily. 135 tablet 3  . lidocaine-prilocaine (EMLA) cream Apply 1 application topically daily as needed (pain).    [name] loratadine (CLARITIN) 10 MG tablet Take 10 mg by mouth daily as needed for allergies.    .  nitroGLYCERIN (NITROSTAT) 0.4 MG SL tablet Place 1 tablet (0.4 mg total) under the tongue every 5 (five) minutes as needed for chest pain. 25 tablet 1  . Omega-3 Fatty Acids (FISH OIL PO) Take 1 capsule by mouth as directed.    . predniSONE (DELTASONE) 10 MG tablet TAKE 1 TABLET(10 MG) BY MOUTH DAILY WITH BREAKFAST 30 tablet 1  . predniSONE (DELTASONE) 20 MG tablet Take 2 tablets (40 mg total) by mouth daily with breakfast. 10 tablet 0  . rosuvastatin (CRESTOR) 20 MG tablet TAKE 1 TABLET BY MOUTH DAILY ON MONDAY, WEDNESDAY, AND FRIDAY (Patient taking differently: TAKE 1 TABLET BY MOUTH DAILY ON MONDAY, WEDNESDAY, AND FRIDAY. Patient states she only takes this occasionally because she gets cramps with it) 40 tablet 3  . senna (SENOKOT) 8.6 MG tablet Take 1 tablet by mouth daily.    . sucralfate (CARAFATE) 1 G tablet TAKE 1 TABLET BY MOUTH TWICE DAILY 60 tablet 3  . traZODone (DESYREL) 50 MG tablet TAKE 1/2 TO 1 TABLET(25 TO 50 MG) BY MOUTH AT BEDTIME AS NEEDED FOR SLEEP 90 tablet 0  . triamcinolone cream (KENALOG) 0.1 % Apply 1 application topically 2 (two) times daily as needed (itching).     Current Facility-Administered Medications  Medication Dose Route Frequency Provider Last Rate Last Dose  . cyanocobalamin ((  VITAMIN B-12)) injection 1,000 mcg  1,000 mcg Intramuscular Q30 days [name], MD   1,000 mcg at 09/16/11 1341   Chief complaint-noted No additions to family history Social history- patient is a current smoker  Objective: BP 130/90 mmHg  Pulse 70  Temp(Src) 98.3 F (36.8 C) (Oral)  SpO2 98% Gen: NAD, alert, cooperative with exam HEENT: NCAT, EOMI, PERRL, TMs nml Neck: FROM, supple CV: RRR, good S1/S2, no murmur Resp: slight expiratory wheezes noted, no crackles, no other focal findings. Appropriate rate, unlabored.  Abd: SNTND, BS present, no guarding or organomegaly Ext: No edema, warm, normal tone, moves UE/LE spontaneously Neuro: Alert and oriented, No gross  deficits Skin: no rashes no lesions  Assessment/Plan:  Mild COPD Exacerbation- pt. Here with mild copd exacerbation by history and exam. He is not febrile. He has no other complaints. Likely viral infection triggering. Vitals stable. - Zithromax x 5 days - prednisone x 5 days.  - return precautions reviewed - supportive care - drink plenty of fluids.

## 2015-09-24 ENCOUNTER — Encounter: Payer: Self-pay | Admitting: Family Medicine

## 2015-09-24 ENCOUNTER — Ambulatory Visit (INDEPENDENT_AMBULATORY_CARE_PROVIDER_SITE_OTHER): Payer: Medicare Other | Admitting: Family Medicine

## 2015-09-24 VITALS — BP 155/90 | HR 70 | Temp 97.7°F | Wt 187.7 lb

## 2015-09-24 DIAGNOSIS — J441 Chronic obstructive pulmonary disease with (acute) exacerbation: Secondary | ICD-10-CM | POA: Diagnosis not present

## 2015-09-24 MED ORDER — AMOXICILLIN-POT CLAVULANATE 500-125 MG PO TABS: 1.0000 | ORAL_TABLET | Freq: Three times a day (TID) | ORAL | Status: DC

## 2015-09-24 NOTE — Progress Notes (Signed)
    Subjective   [name] is a 69 y.o. female that presents for a same day visit  1. COPD exacerbation: She was recently seen here for similar symptoms last month. She is followed by Dr. Annamaria Boots outpatient. She reports symptoms returned about one week ago. She had an episode of diaphoresis this morning. She reports angina that has been present for years but no new chest pain. She has associated coughing with yellow sputum production which has increased. No fevers, nausea or vomiting.  ROS Per HPI  Social History  Substance Use Topics  . Smoking status: Former Smoker -- 0.10 packs/day for 50 years    Types: Cigarettes    Quit date: 08/16/2011  . Smokeless tobacco: Never Used     Comment: quit 2012  . Alcohol Use: No     Comment: quit 08/16/2011    Allergies  Allergen Reactions  . Ace Inhibitors Other (See Comments)    REACTION: angioedema    Objective   BP 155/90 mmHg  Pulse 70  Temp(Src) 97.7 F (36.5 C) (Oral)  Wt 187 lb 11.2 oz (85.14 kg)  General: Well appearing HEENT: Nares patent and clear, oropharynx clear without erythema. No cervical adenopathy Respiratory/Chest: Clear to auscultation without wheezing  Assessment and Plan   Meds ordered this encounter  Medications  . amoxicillin-clavulanate (AUGMENTIN) 500-125 MG tablet    Sig: Take 1 tablet (500 mg total) by mouth 3 (three) times daily.    Dispense:  15 tablet    Refill:  0    COPD exacerbation: patient looks well with increased coughing, sputum and change in sputum. History of COPD. No fevers. Minimal wheezing with non on exam.  augmentin x5 days  Return precautions

## 2015-09-24 NOTE — Patient Instructions (Signed)
Thank you for coming to see me today. It was a pleasure. Today we talked about:   COPD exacerbation: I will treat you with Augmentin (antibiotic) for 5 days. If your symptoms do not improve, please return for follow-up  If you have any questions or concerns, please do not hesitate to call the office at [phone].  Sincerely,  Cordelia Poche, MD  Chronic Obstructive Pulmonary Disease Chronic obstructive pulmonary disease (COPD) is a common lung condition in which airflow from the lungs is limited. COPD is a general term that can be used to describe many different lung problems that limit airflow, including both chronic bronchitis and emphysema. If you have COPD, your lung function will probably never return to normal, but there are measures you can take to improve lung function and make yourself feel better. CAUSES   Smoking (common).  Exposure to secondhand smoke.  Genetic problems.  Chronic inflammatory lung diseases or recurrent infections. SYMPTOMS  Shortness of breath, especially with physical activity.  Deep, persistent (chronic) cough with a large amount of thick mucus.  Wheezing.  Rapid breaths (tachypnea).  Gray or bluish discoloration (cyanosis) of the skin, especially in your fingers, toes, or lips.  Fatigue.  Weight loss.  Frequent infections or episodes when breathing symptoms become much worse (exacerbations).  Chest tightness. DIAGNOSIS Your health care provider will take a medical history and perform a physical examination to diagnose COPD. Additional tests for COPD may include:  Lung (pulmonary) function tests.  Chest X-ray.  CT scan.  Blood tests. TREATMENT  Treatment for COPD may include:  Inhaler and nebulizer medicines. These help manage the symptoms of COPD and make your breathing more comfortable.  Supplemental oxygen. Supplemental oxygen is only helpful if you have a low oxygen level in your blood.  Exercise and physical activity.  These are beneficial for nearly all people with COPD.  Lung surgery or transplant.  Nutrition therapy to gain weight, if you are underweight.  Pulmonary rehabilitation. This may involve working with a team of health care providers and specialists, such as respiratory, occupational, and physical therapists. HOME CARE INSTRUCTIONS  Take all medicines (inhaled or pills) as directed by your health care provider.  Avoid over-the-counter medicines or cough syrups that dry up your airway (such as antihistamines) and slow down the elimination of secretions unless instructed otherwise by your health care provider.  If you are a smoker, the most important thing that you can do is stop smoking. Continuing to smoke will cause further lung damage and breathing trouble. Ask your health care provider for help with quitting smoking. He or she can direct you to community resources or hospitals that provide support.  Avoid exposure to irritants such as smoke, chemicals, and fumes that aggravate your breathing.  Use oxygen therapy and pulmonary rehabilitation if directed by your health care provider. If you require home oxygen therapy, ask your health care provider whether you should purchase a pulse oximeter to measure your oxygen level at home.  Avoid contact with individuals who have a contagious illness.  Avoid extreme temperature and humidity changes.  Eat healthy foods. Eating smaller, more frequent meals and resting before meals may help you maintain your strength.  Stay active, but balance activity with periods of rest. Exercise and physical activity will help you maintain your ability to do things you want to do.  Preventing infection and hospitalization is very important when you have COPD. Make sure to receive all the vaccines your health care provider recommends, especially  the pneumococcal and influenza vaccines. Ask your health care provider whether you need a pneumonia vaccine.  Learn and  use relaxation techniques to manage stress.  Learn and use controlled breathing techniques as directed by your health care provider. Controlled breathing techniques include:  Pursed lip breathing. Start by breathing in (inhaling) through your nose for 1 second. Then, purse your lips as if you were going to whistle and breathe out (exhale) through the pursed lips for 2 seconds.  Diaphragmatic breathing. Start by putting one hand on your abdomen just above your waist. Inhale slowly through your nose. The hand on your abdomen should move out. Then purse your lips and exhale slowly. You should be able to feel the hand on your abdomen moving in as you exhale.  Learn and use controlled coughing to clear mucus from your lungs. Controlled coughing is a series of short, progressive coughs. The steps of controlled coughing are: 1. Lean your head slightly forward. 2. Breathe in deeply using diaphragmatic breathing. 3. Try to hold your breath for 3 seconds. 4. Keep your mouth slightly open while coughing twice. 5. Spit any mucus out into a tissue. 6. Rest and repeat the steps once or twice as needed. SEEK MEDICAL CARE IF:  You are coughing up more mucus than usual.  There is a change in the color or thickness of your mucus.  Your breathing is more labored than usual.  Your breathing is faster than usual. SEEK IMMEDIATE MEDICAL CARE IF:  You have shortness of breath while you are resting.  You have shortness of breath that prevents you from:  Being able to talk.  Performing your usual physical activities.  You have chest pain lasting longer than 5 minutes.  Your skin color is more cyanotic than usual.  You measure low oxygen saturations for longer than 5 minutes with a pulse oximeter. MAKE SURE YOU:  Understand these instructions.  Will watch your condition.  Will get help right away if you are not doing well or get worse.   This information is not intended to replace advice given  to you by your health care provider. Make sure you discuss any questions you have with your health care provider.   Document Released: 09/10/2005 Document Revised: 12/22/2014 Document Reviewed: 07/28/2013 Elsevier Interactive Patient Education [employer].

## 2015-10-05 ENCOUNTER — Encounter: Payer: Self-pay | Admitting: Family Medicine

## 2015-10-05 ENCOUNTER — Ambulatory Visit (INDEPENDENT_AMBULATORY_CARE_PROVIDER_SITE_OTHER): Payer: Medicare Other | Admitting: Family Medicine

## 2015-10-05 VITALS — BP 139/95 | HR 70 | Temp 97.7°F | Ht 65.0 in | Wt 183.5 lb

## 2015-10-05 DIAGNOSIS — Z23 Encounter for immunization: Secondary | ICD-10-CM | POA: Diagnosis not present

## 2015-10-05 DIAGNOSIS — J449 Chronic obstructive pulmonary disease, unspecified: Secondary | ICD-10-CM

## 2015-10-05 DIAGNOSIS — Z1239 Encounter for other screening for malignant neoplasm of breast: Secondary | ICD-10-CM | POA: Diagnosis not present

## 2015-10-05 DIAGNOSIS — Z0000 Encounter for general adult medical examination without abnormal findings: Secondary | ICD-10-CM | POA: Diagnosis not present

## 2015-10-05 MED ORDER — ZOSTER VACCINE LIVE 19400 UNT/0.65ML ~~LOC~~ SOLR: 0.6500 mL | Freq: Once | SUBCUTANEOUS | Status: DC

## 2015-10-05 MED ORDER — TETANUS-DIPHTH-ACELL PERTUSSIS 5-2.5-18.5 LF-MCG/0.5 IM SUSP: 0.5000 mL | Freq: Once | INTRAMUSCULAR | Status: DC

## 2015-10-05 NOTE — Patient Instructions (Signed)
Prednisone: take just the 10mg  daily  Lasix/furosemide: okay to take once daily, but if noticing breathing, leg swelling, or weight going up need to take twice daily Monday Wednesday Friday  Okay to stop the crestor for 2 weeks and see how your muscle feel   Go to the pharmacy to get your Shingles and Tdap shots  Go to the Breast Imaging center for Mammogram

## 2015-10-05 NOTE — Progress Notes (Signed)
   Subjective:    Patient ID: [name], female    DOB: August 04, 1946, 69 y.o.   MRN: [id number]  HPI  CC: annual  # Annual:  Told she was due for pap smear, discussed with her at her age and normal past pap smears okay to stop. She agrees.  Due for mammogram  Due for zostavax, tdap  # Recent COPD exacerbation  Was treated for COPD exacerbation with azithromycin in September, augmentin 10 days ago. On longterm prednisone for PMR but given prednisone bursts  Denies any breathing issues currently ROS: no SOB, no CP  Social Hx: former smoker  Review of Systems   12 point ROS sheet reviewed and is positive for: trouble swallowing, constipation, abdominal pain/bloating, headaches, weakness, dizziness, anxiety. All other responses negative.  Past medical history, surgical, family, and social history reviewed and updated in the EMR as appropriate. Objective:  BP 139/95 mmHg  Pulse 70  Temp(Src) 97.7 F (36.5 C) (Oral)  Ht 5\' 5"  (1.651 m)  Wt 183 lb 8 oz (83.235 kg)  BMI 30.54 kg/m2 Vitals and nursing note reviewed  General: NAD CV: RRR, normal s1s2, no murmurs/rub/gallop. 2+ radial pulses bilaterally  Resp: clear to auscultation bilaterally, normal effort. No wheezes Ext: trace edema bilaterally  Neuro: alert, no deficits noted Skin: no rashes  Assessment & Plan:  Preventative health care Recommend no further pap smear testing. Order placed for mammogram. Zostavax and tdap sent to pharmacy.   COPD, moderate Recent exacerbations treated with antibiotics. No evidence of exacerbation on exam today. Recommend she go back to her 10mg  daily prednisone for PMR (will need to discuss in future titrating down, has been on this dose for 5-6 months). Follow up 3 months.

## 2015-10-08 NOTE — Assessment & Plan Note (Signed)
Recommend no further pap smear testing. Order placed for mammogram. Zostavax and tdap sent to pharmacy.

## 2015-10-08 NOTE — Assessment & Plan Note (Signed)
Recent exacerbations treated with antibiotics. No evidence of exacerbation on exam today. Recommend she go back to her 10mg  daily prednisone for PMR (will need to discuss in future titrating down, has been on this dose for 5-6 months). Follow up 3 months.

## 2015-10-17 ENCOUNTER — Other Ambulatory Visit: Payer: Self-pay | Admitting: Family Medicine

## 2015-11-13 ENCOUNTER — Telehealth: Payer: Self-pay | Admitting: Internal Medicine

## 2015-11-13 NOTE — Telephone Encounter (Signed)
New Message   Patient c/o Palpitations:  High priority if patient c/o lightheadedness and shortness of breath.  1. How long have you been having palpitations? At least 1 week  2. Are you currently experiencing lightheadedness and shortness of breath? Sometimes she finds herself light headed.   3. Have you checked your BP and heart rate? (document readings) Yes but she is not sure how to actually read them.   4. Are you experiencing any other symptoms? Pt states that when she swallows it seldom hurts her chest. Pt also states that the Palpitations becomes unbearable after she takes her medications sometimes. Pt states that she is not sure which medications could be the problem or even if it is her medications. Please call back to discuss.

## 2015-11-14 NOTE — Telephone Encounter (Signed)
Spoke with patient and this has been going on for a while.  She has had a negative stress test in the past and has discussed these same symptoms with Dr Rayann Heman previously.  She is thinking now that it could be one of her medications that could be causing her to feel the indigestion like pain.  She did not mention the palpitations as much during our conversation.  She is going to stop taking her Multi Vit and her Calcium for a few days as she says she notices when she take these the feeling is worse.  She doesn't recall if she takes these vitamins with food or on empty stomach  She is going to stop and see if this helps.  She will call me back in a few days

## 2015-11-19 ENCOUNTER — Encounter: Payer: Self-pay | Admitting: Internal Medicine

## 2015-11-19 NOTE — Telephone Encounter (Signed)
This encounter was created in error - please disregard.

## 2015-11-19 NOTE — Telephone Encounter (Signed)
Spoke with patient and she is still having the fluttering 3-4 times daily.  Only lasting seconds. I let her know that there is not a lot to do with these episodes as they only last for seconds.  When she was seen by Dr Rayann Heman in July she was to be set up with Dr Oval Linsey in 3-4 months for general cardiology follow up.  This was never done.  I will have Melissa contact patient to set up.

## 2015-11-19 NOTE — Telephone Encounter (Signed)
New message ° ° ° ° °Returning a call to the nurse °

## 2015-11-19 NOTE — Telephone Encounter (Signed)
[name] at 11/19/2015 1:49 PM     Status: Signed       Expand All Collapse All   New message      Returning a call to the nurse

## 2015-11-25 ENCOUNTER — Emergency Department (HOSPITAL_COMMUNITY): Payer: Medicare Other

## 2015-11-25 ENCOUNTER — Observation Stay (HOSPITAL_COMMUNITY)
Admission: EM | Admit: 2015-11-25 | Discharge: 2015-11-27 | Disposition: A | Discharge status: Home / Self Care | Payer: Medicare Other | Attending: Family Medicine | Admitting: Family Medicine

## 2015-11-25 ENCOUNTER — Encounter (HOSPITAL_COMMUNITY): Payer: Self-pay | Admitting: Emergency Medicine

## 2015-11-25 ENCOUNTER — Other Ambulatory Visit (HOSPITAL_COMMUNITY): Payer: Medicare Other

## 2015-11-25 DIAGNOSIS — N183 Chronic kidney disease, stage 3 unspecified: Secondary | ICD-10-CM | POA: Diagnosis present

## 2015-11-25 DIAGNOSIS — R0609 Other forms of dyspnea: Secondary | ICD-10-CM | POA: Diagnosis not present

## 2015-11-25 DIAGNOSIS — D509 Iron deficiency anemia, unspecified: Secondary | ICD-10-CM | POA: Insufficient documentation

## 2015-11-25 DIAGNOSIS — Z950 Presence of cardiac pacemaker: Secondary | ICD-10-CM | POA: Diagnosis not present

## 2015-11-25 DIAGNOSIS — E785 Hyperlipidemia, unspecified: Secondary | ICD-10-CM | POA: Insufficient documentation

## 2015-11-25 DIAGNOSIS — Q2733 Arteriovenous malformation of digestive system vessel: Secondary | ICD-10-CM | POA: Insufficient documentation

## 2015-11-25 DIAGNOSIS — R0789 Other chest pain: Secondary | ICD-10-CM | POA: Diagnosis not present

## 2015-11-25 DIAGNOSIS — I509 Heart failure, unspecified: Secondary | ICD-10-CM | POA: Insufficient documentation

## 2015-11-25 DIAGNOSIS — Z7982 Long term (current) use of aspirin: Secondary | ICD-10-CM | POA: Diagnosis not present

## 2015-11-25 DIAGNOSIS — Z7952 Long term (current) use of systemic steroids: Secondary | ICD-10-CM | POA: Diagnosis not present

## 2015-11-25 DIAGNOSIS — K922 Gastrointestinal hemorrhage, unspecified: Secondary | ICD-10-CM | POA: Insufficient documentation

## 2015-11-25 DIAGNOSIS — I482 Chronic atrial fibrillation: Secondary | ICD-10-CM | POA: Diagnosis not present

## 2015-11-25 DIAGNOSIS — Z23 Encounter for immunization: Secondary | ICD-10-CM | POA: Diagnosis not present

## 2015-11-25 DIAGNOSIS — I209 Angina pectoris, unspecified: Secondary | ICD-10-CM | POA: Insufficient documentation

## 2015-11-25 DIAGNOSIS — K219 Gastro-esophageal reflux disease without esophagitis: Secondary | ICD-10-CM | POA: Diagnosis not present

## 2015-11-25 DIAGNOSIS — R0600 Dyspnea, unspecified: Secondary | ICD-10-CM | POA: Diagnosis not present

## 2015-11-25 DIAGNOSIS — M353 Polymyalgia rheumatica: Secondary | ICD-10-CM | POA: Diagnosis present

## 2015-11-25 DIAGNOSIS — F410 Panic disorder [episodic paroxysmal anxiety] without agoraphobia: Secondary | ICD-10-CM | POA: Diagnosis not present

## 2015-11-25 DIAGNOSIS — R079 Chest pain, unspecified: Principal | ICD-10-CM | POA: Insufficient documentation

## 2015-11-25 DIAGNOSIS — I422 Other hypertrophic cardiomyopathy: Secondary | ICD-10-CM | POA: Diagnosis not present

## 2015-11-25 DIAGNOSIS — R002 Palpitations: Secondary | ICD-10-CM | POA: Insufficient documentation

## 2015-11-25 DIAGNOSIS — F329 Major depressive disorder, single episode, unspecified: Secondary | ICD-10-CM | POA: Diagnosis not present

## 2015-11-25 DIAGNOSIS — R0602 Shortness of breath: Secondary | ICD-10-CM | POA: Diagnosis not present

## 2015-11-25 DIAGNOSIS — M1990 Unspecified osteoarthritis, unspecified site: Secondary | ICD-10-CM | POA: Insufficient documentation

## 2015-11-25 DIAGNOSIS — Z87891 Personal history of nicotine dependence: Secondary | ICD-10-CM | POA: Insufficient documentation

## 2015-11-25 DIAGNOSIS — I442 Atrioventricular block, complete: Secondary | ICD-10-CM | POA: Diagnosis present

## 2015-11-25 DIAGNOSIS — I10 Essential (primary) hypertension: Secondary | ICD-10-CM

## 2015-11-25 DIAGNOSIS — Z79899 Other long term (current) drug therapy: Secondary | ICD-10-CM | POA: Insufficient documentation

## 2015-11-25 DIAGNOSIS — G2581 Restless legs syndrome: Secondary | ICD-10-CM | POA: Insufficient documentation

## 2015-11-25 DIAGNOSIS — J449 Chronic obstructive pulmonary disease, unspecified: Secondary | ICD-10-CM | POA: Diagnosis present

## 2015-11-25 DIAGNOSIS — R072 Precordial pain: Secondary | ICD-10-CM

## 2015-11-25 DIAGNOSIS — J45909 Unspecified asthma, uncomplicated: Secondary | ICD-10-CM | POA: Diagnosis not present

## 2015-11-25 DIAGNOSIS — N184 Chronic kidney disease, stage 4 (severe): Secondary | ICD-10-CM | POA: Diagnosis present

## 2015-11-25 DIAGNOSIS — G4730 Sleep apnea, unspecified: Secondary | ICD-10-CM | POA: Insufficient documentation

## 2015-11-25 DIAGNOSIS — D649 Anemia, unspecified: Secondary | ICD-10-CM | POA: Diagnosis not present

## 2015-11-25 DIAGNOSIS — I4891 Unspecified atrial fibrillation: Secondary | ICD-10-CM | POA: Diagnosis present

## 2015-11-25 DIAGNOSIS — I5023 Acute on chronic systolic (congestive) heart failure: Secondary | ICD-10-CM

## 2015-11-25 LAB — CBC
HEMATOCRIT: 32 % — AB (ref 36.0–46.0)
HEMOGLOBIN: 10 g/dL — AB (ref 12.0–15.0)
MCH: 28.4 pg (ref 26.0–34.0)
MCHC: 31.3 g/dL (ref 30.0–36.0)
MCV: 90.9 fL (ref 78.0–100.0)
Platelets: 227 10*3/uL (ref 150–400)
RBC: 3.52 MIL/uL — AB (ref 3.87–5.11)
RDW: 14.2 % (ref 11.5–15.5)
WBC: 3.8 10*3/uL — AB (ref 4.0–10.5)

## 2015-11-25 LAB — BASIC METABOLIC PANEL
ANION GAP: 10 (ref 5–15)
BUN: 21 mg/dL — ABNORMAL HIGH (ref 6–20)
CO2: 25 mmol/L (ref 22–32)
Calcium: 9.3 mg/dL (ref 8.9–10.3)
Chloride: 105 mmol/L (ref 101–111)
Creatinine, Ser: 1.37 mg/dL — ABNORMAL HIGH (ref 0.44–1.00)
GFR, EST AFRICAN AMERICAN: 44 mL/min — AB (ref >60)
GFR, EST NON AFRICAN AMERICAN: 38 mL/min — AB (ref >60)
GLUCOSE: 142 mg/dL — AB (ref 65–99)
POTASSIUM: 4.2 mmol/L (ref 3.5–5.1)
SODIUM: 140 mmol/L (ref 135–145)

## 2015-11-25 LAB — BRAIN NATRIURETIC PEPTIDE: B NATRIURETIC PEPTIDE 5: 436.4 pg/mL — AB (ref 0.0–100.0)

## 2015-11-25 LAB — TROPONIN I
TROPONIN I: 0.19 ng/mL — AB (ref <0.031)
TROPONIN I: 0.19 ng/mL — AB (ref <0.031)
Troponin I: 0.18 ng/mL — ABNORMAL HIGH (ref <0.031)

## 2015-11-25 LAB — HEPARIN LEVEL (UNFRACTIONATED): HEPARIN UNFRACTIONATED: 1.2 [IU]/mL — AB (ref 0.30–0.70)

## 2015-11-25 MED ORDER — ALBUTEROL SULFATE (2.5 MG/3ML) 0.083% IN NEBU
INHALATION_SOLUTION | RESPIRATORY_TRACT | Status: AC
  Filled 2015-11-25: qty 6

## 2015-11-25 MED ORDER — NITROGLYCERIN 0.4 MG SL SUBL: 0.4000 mg | SUBLINGUAL_TABLET | SUBLINGUAL | Status: DC | PRN

## 2015-11-25 MED ORDER — HEPARIN (PORCINE) IN NACL 100-0.45 UNIT/ML-% IJ SOLN
1000.0000 [IU]/h | INTRAMUSCULAR | Status: DC
  Administered 2015-11-25: 1000 [IU]/h via INTRAVENOUS
  Filled 2015-11-25: qty 250

## 2015-11-25 MED ORDER — FUROSEMIDE 10 MG/ML IJ SOLN
40.0000 mg | Freq: Two times a day (BID) | INTRAMUSCULAR | Status: DC
  Administered 2015-11-25: 40 mg via INTRAVENOUS
  Filled 2015-11-25: qty 4

## 2015-11-25 MED ORDER — ACETAMINOPHEN 325 MG PO TABS: 650.0000 mg | ORAL_TABLET | ORAL | Status: DC | PRN

## 2015-11-25 MED ORDER — ONDANSETRON HCL 4 MG/2ML IJ SOLN: 4.0000 mg | Freq: Four times a day (QID) | INTRAMUSCULAR | Status: DC | PRN

## 2015-11-25 MED ORDER — ASPIRIN 81 MG PO CHEW
324.0000 mg | CHEWABLE_TABLET | Freq: Once | ORAL | Status: AC
  Administered 2015-11-25: 324 mg via ORAL
  Filled 2015-11-25: qty 4

## 2015-11-25 MED ORDER — INFLUENZA VAC SPLIT QUAD 0.5 ML IM SUSY
0.5000 mL | PREFILLED_SYRINGE | INTRAMUSCULAR | Status: AC
  Administered 2015-11-26: 0.5 mL via INTRAMUSCULAR

## 2015-11-25 MED ORDER — POTASSIUM CHLORIDE CRYS ER 20 MEQ PO TBCR
40.0000 meq | EXTENDED_RELEASE_TABLET | Freq: Two times a day (BID) | ORAL | Status: DC
  Administered 2015-11-25 – 2015-11-27 (×4): 40 meq via ORAL
  Filled 2015-11-25 (×4): qty 2

## 2015-11-25 MED ORDER — POLYETHYLENE GLYCOL 3350 17 G PO PACK
17.0000 g | PACK | Freq: Every day | ORAL | Status: DC
  Administered 2015-11-25 – 2015-11-27 (×3): 17 g via ORAL
  Filled 2015-11-25 (×3): qty 1

## 2015-11-25 MED ORDER — HEPARIN SODIUM (PORCINE) 5000 UNIT/ML IJ SOLN
5000.0000 [IU] | Freq: Three times a day (TID) | INTRAMUSCULAR | Status: DC
  Administered 2015-11-25 (×2): 5000 [IU] via SUBCUTANEOUS
  Filled 2015-11-25 (×2): qty 1

## 2015-11-25 MED ORDER — CARVEDILOL 25 MG PO TABS
25.0000 mg | ORAL_TABLET | Freq: Two times a day (BID) | ORAL | Status: DC
  Administered 2015-11-25 – 2015-11-27 (×5): 25 mg via ORAL
  Filled 2015-11-25 (×5): qty 1

## 2015-11-25 MED ORDER — ASPIRIN EC 81 MG PO TBEC
81.0000 mg | DELAYED_RELEASE_TABLET | Freq: Every day | ORAL | Status: DC
  Administered 2015-11-25 – 2015-11-27 (×3): 81 mg via ORAL
  Filled 2015-11-25 (×3): qty 1

## 2015-11-25 MED ORDER — FUROSEMIDE 10 MG/ML IJ SOLN
40.0000 mg | Freq: Once | INTRAMUSCULAR | Status: AC
  Administered 2015-11-25: 40 mg via INTRAVENOUS
  Filled 2015-11-25: qty 4

## 2015-11-25 MED ORDER — FUROSEMIDE 10 MG/ML IJ SOLN
40.0000 mg | Freq: Three times a day (TID) | INTRAMUSCULAR | Status: DC
  Administered 2015-11-25 – 2015-11-26 (×5): 40 mg via INTRAVENOUS
  Filled 2015-11-25 (×5): qty 4

## 2015-11-25 MED ORDER — ROSUVASTATIN CALCIUM 10 MG PO TABS
20.0000 mg | ORAL_TABLET | ORAL | Status: DC
  Administered 2015-11-26: 20 mg via ORAL
  Filled 2015-11-25: qty 2

## 2015-11-25 MED ORDER — CALCIUM CARBONATE-VITAMIN D 500-200 MG-UNIT PO TABS
1.0000 | ORAL_TABLET | Freq: Every day | ORAL | Status: DC
  Administered 2015-11-25 – 2015-11-27 (×3): 1 via ORAL
  Filled 2015-11-25 (×3): qty 1

## 2015-11-25 MED ORDER — PANTOPRAZOLE SODIUM 40 MG PO TBEC
80.0000 mg | DELAYED_RELEASE_TABLET | Freq: Every day | ORAL | Status: DC
  Administered 2015-11-25 – 2015-11-27 (×3): 80 mg via ORAL
  Filled 2015-11-25 (×3): qty 2

## 2015-11-25 MED ORDER — DOCUSATE SODIUM 100 MG PO CAPS
100.0000 mg | ORAL_CAPSULE | Freq: Two times a day (BID) | ORAL | Status: DC
  Administered 2015-11-25 – 2015-11-27 (×5): 100 mg via ORAL
  Filled 2015-11-25 (×5): qty 1

## 2015-11-25 MED ORDER — POTASSIUM CHLORIDE CRYS ER 20 MEQ PO TBCR
40.0000 meq | EXTENDED_RELEASE_TABLET | Freq: Once | ORAL | Status: AC
  Administered 2015-11-25: 40 meq via ORAL
  Filled 2015-11-25: qty 2

## 2015-11-25 MED ORDER — HEPARIN BOLUS VIA INFUSION
4000.0000 [IU] | Freq: Once | INTRAVENOUS | Status: AC
  Administered 2015-11-25: 4000 [IU] via INTRAVENOUS
  Filled 2015-11-25: qty 4000

## 2015-11-25 MED ORDER — SUCRALFATE 1 G PO TABS
1.0000 g | ORAL_TABLET | Freq: Two times a day (BID) | ORAL | Status: DC
  Administered 2015-11-25 – 2015-11-27 (×5): 1 g via ORAL
  Filled 2015-11-25 (×5): qty 1

## 2015-11-25 MED ORDER — POTASSIUM CHLORIDE CRYS ER 20 MEQ PO TBCR: 40.0000 meq | EXTENDED_RELEASE_TABLET | Freq: Every day | ORAL | Status: DC

## 2015-11-25 MED ORDER — PREDNISONE 10 MG PO TABS
10.0000 mg | ORAL_TABLET | Freq: Every day | ORAL | Status: DC
  Administered 2015-11-25 – 2015-11-27 (×3): 10 mg via ORAL
  Filled 2015-11-25 (×3): qty 1

## 2015-11-25 MED ORDER — MAGNESIUM SULFATE 2 GM/50ML IV SOLN
2.0000 g | Freq: Once | INTRAVENOUS | Status: AC
  Administered 2015-11-25: 2 g via INTRAVENOUS
  Filled 2015-11-25: qty 50

## 2015-11-25 NOTE — ED Provider Notes (Signed)
By signing my name below, I, Randa Evens, attest that this documentation has been prepared under the direction and in the presence of [employer], DO. Electronically Signed: Randa Evens, ED Scribe. 11/25/2015. 12:52 AM.  TIME SEEN: 12:53 AM   CHIEF COMPLAINT: SOB  HPI: HPI Comments: [name] is a 69 y.o. female with history of atrial fibrillation, hypertension, hyperlipidemia, complete heart block status post pacemaker, previous tobacco use quit in 2012, last stress test in October 2015 which showed inferior lateral and apical defects who presents to the Emergency Department complaining of chronic intermittent palpitations described as flutters for the last 2 years that has recently worsened within the last 2 weeks. She states that she is now having chest tightness intermittently over the past 1-2 weeks but states now it is getting progressively worse. She reports associated intermittent SOB with exertion, nausea and light headedness. She states that drinking, eating and certain movements makes the pain worse. But she also states however that exertion makes her pain worse and makes her shortness of breath worse and rest makes it better. Denies diaphoresis with her chest pain but states that she has been waking up in the mouth the night sweating. No fevers or cough.  History is limited as patient is an extremely poor historian.  Pt states that she has a cardiology appointment on 12/14/2015 with [facility] cardiology. PCP family medicine    ROS: See HPI Constitutional: no fever  Eyes: no drainage  ENT: no runny nose   Cardiovascular:   chest pain  Resp: SOB  GI: no vomiting GU: no dysuria Integumentary: no rash  Allergy: no hives  Musculoskeletal: no leg swelling  Neurological: no slurred speech ROS otherwise negative  PAST MEDICAL HISTORY/PAST SURGICAL HISTORY:  Past Medical History  Diagnosis Date  . Angioedema     2/2 ACE  . RLS (restless legs syndrome)     Dx  06/2007  . Anemia   . Arteriovenous malformation of stomach   . Permanent atrial fibrillation ([place])   . Renal failure     baseline creatinine 1.6  . Hyperlipidemia   . Depression   . Diastolic heart failure   . Arthritis   . Sleep apnea   . Asthma   . Hx of colonic polyp 08/13/10    adenomatous  . Fatty liver 07/26/10  . Panic attacks   . Hypertension   . Complete heart block ([place])     s/p PPM 1998  . Hypertrophic cardiomyopathy ([place])     dx by Dr [name] 2009  . Hx of colonoscopy   . GI bleed   . Panic attack   . DDD (degenerative disc disease)   . History of alcohol abuse Stopped Fall 2012  . History of tobacco use Quit Fall 2012  . Shortness of breath     sob on exertation  . CHF (congestive heart failure) ([place])   . Angina   . GERD (gastroesophageal reflux disease)   . AVM (arteriovenous malformation) of colon     small intestine; stomach  . Right arm pain 01/08/2012  . Hyperlipidemia   . Blood transfusion   . Hx of cardiovascular stress test     a. Lexiscan Myoview (10/15):  Small inferolateral and apical defect c/w scar and poss soft tissue attenuation, no ischemia, EF 42%  . Iron deficiency anemia     MEDICATIONS:  Prior to Admission medications   Medication Sig Start Date End Date Taking? Authorizing Provider  acetaminophen (TYLENOL) 325 MG  tablet Take 325 mg by mouth daily as needed (pain).    Historical Provider, MD  ALPRAZolam Duanne Moron) 0.5 MG tablet TAKE 1 TABLET BY MOUTH EVERY DAY AS NEEDED FOR ANXIETY OR SLEEP 08/28/15   Leone Brand, MD  aspirin EC 81 MG tablet Take 81 mg by mouth daily. Pt states she sometimes takes 1/2 of a 325 mg instead of 81 mg daily    Historical Provider, MD  Calcium-Vitamin D 600-200 MG-UNIT per tablet Take 1 tablet by mouth as directed.    Historical Provider, MD  carvedilol (COREG) 25 MG tablet TAKE 1 TABLET BY MOUTH TWICE DAILY 08/28/15   Thompson Grayer, MD  esomeprazole (NEXIUM) 40 MG capsule TAKE ONE CAPSULE BY MOUTH EVERY MORNING  BEFORE BREAKFAST 03/22/15   Leone Brand, MD  ferrous sulfate 325 (65 FE) MG tablet TAKE 1 TABLET BY MOUTH TWICE DAILY WITH MEALS TO KEEP BLOOD COUNT UP 08/03/14   Leone Brand, MD  furosemide (LASIX) 20 MG tablet Take 1 tablet (20 mg total) by mouth daily. Except for Monday, Wednesday and Friday take 40 mg by mouth daily. 02/16/15   Liliane Shi, PA-C  lidocaine-prilocaine (EMLA) cream Apply 1 application topically daily as needed (pain).    Historical Provider, MD  loratadine (CLARITIN) 10 MG tablet Take 10 mg by mouth daily as needed for allergies.    Historical Provider, MD  nitroGLYCERIN (NITROSTAT) 0.4 MG SL tablet Place 1 tablet (0.4 mg total) under the tongue every 5 (five) minutes as needed for chest pain. 01/18/15   Eileen Stanford, PA-C  Omega-3 Fatty Acids (FISH OIL PO) Take 1 capsule by mouth as directed.    Historical Provider, MD  predniSONE (DELTASONE) 10 MG tablet TAKE 1 TABLET(10 MG) BY MOUTH DAILY WITH BREAKFAST 10/17/15   Leone Brand, MD  rosuvastatin (CRESTOR) 20 MG tablet TAKE 1 TABLET BY MOUTH DAILY ON MONDAY, [place], AND FRIDAY Patient taking differently: TAKE 1 TABLET BY MOUTH DAILY ON MONDAY, [place], AND FRIDAY. Patient states she only takes this occasionally because she gets cramps with it 05/21/15   Olam Idler, MD  senna (SENOKOT) 8.6 MG tablet Take 1 tablet by mouth daily.    Historical Provider, MD  sucralfate (CARAFATE) 1 G tablet TAKE 1 TABLET BY MOUTH TWICE DAILY 08/28/15   Leone Brand, MD  Tdap Durwin Reges) 5-2.5-18.5 LF-MCG/0.5 injection Inject 0.5 mLs into the muscle once. 10/05/15   Leone Brand, MD  traZODone (DESYREL) 50 MG tablet TAKE 1/2 TO 1 TABLET(25 TO 50 MG) BY MOUTH AT BEDTIME AS NEEDED FOR SLEEP 06/11/15   Leone Brand, MD  triamcinolone cream (KENALOG) 0.1 % Apply 1 application topically 2 (two) times daily as needed (itching).    Historical Provider, MD  zoster vaccine live, PF, (ZOSTAVAX) 09811 UNT/0.65ML injection Inject 19,400 Units  into the skin once. 10/05/15   Leone Brand, MD    ALLERGIES:  Allergies  Allergen Reactions  . Ace Inhibitors Other (See Comments)    REACTION: angioedema    SOCIAL HISTORY:  Social History  Substance Use Topics  . Smoking status: Former Smoker -- 0.10 packs/day for 50 years    Types: Cigarettes    Quit date: 08/16/2011  . Smokeless tobacco: Never Used     Comment: quit 2012  . Alcohol Use: No     Comment: quit 08/16/2011    FAMILY HISTORY: Family History  Problem Relation Age of Onset  . Hypertension Mother   .  Stroke Mother   . Hypertension Father   . Stroke Father   . Hypertension Sister   . Hypertension Brother   . Colon cancer Brother   . Diabetes Sister   . Diabetes Brother   . Cancer Brother   . Heart attack Brother   . Heart attack Father   . Heart disease Mother   . Heart disease Sister   . Alcohol abuse Brother   . Alcohol abuse Father   . Aneurysm Mother   . CVA Mother     EXAM: BP 150/96 mmHg  Pulse 70  Temp(Src) 98.2 F (36.8 C) (Oral)  Resp 24  SpO2 97%   CONSTITUTIONAL: Alert and oriented and responds appropriately to questions. Elderly, in no significant distress HEAD: Normocephalic EYES: Conjunctivae clear, PERRL ENT: normal nose; no rhinorrhea; moist mucous membranes; pharynx without lesions noted NECK: Supple, no meningismus, no LAD  CARD: RRR; S1 and S2 appreciated; no murmurs, no clicks, no rubs, no gallops CHEST:  Nontender to palpation without crepitus, ecchymosis or deformity RESP: Normal chest excursion without splinting or tachypnea; breath sounds equal bilaterally; no wheezes, no rhonchi, mild bibasilar rales, no hypoxia or respiratory distress, speaking full sentences ABD/GI: Normal bowel sounds; non-distended; soft, non-tender, no rebound, no guarding, no peritoneal signs BACK:  The back appears normal and is non-tender to palpation, there is no CVA tenderness EXT: Normal ROM in all joints; non-tender to palpation; bilateral  LE edema; normal capillary refill; no cyanosis, no calf tenderness or swelling    SKIN: Normal color for age and race; warm NEURO: Moves all extremities equally, sensation to light touch intact diffusely, cranial nerves II through XII intact PSYCH: The patient's mood and manner are appropriate. Grooming and personal hygiene are appropriate.  MEDICAL DECISION MAKING: Patient here complains of chest pain, shortness of breath over the past week it is progressively worsened. Also having palpitations for the past 1-2 years. Patient is a very poor historian which makes assessment difficult she does have multiple risk factors for ACS and her last stress test was over one year ago and showed fixed irreversible scars. Will interrogate her pacemaker. Her EKG shows a paced rhythm. Her first troponin is negative. She does appear slightly volume overloaded on exam and her chest x-ray shows pulmonary edema. We'll give IV Lasix. She is still having mild chest pressure that is not reproducible with palpation of her chest wall. We'll give aspirin, nitroglycerin. Discussed with family medicine resident for admission. Attending is Dr. Rory Percy. Will admit to telemetry, observation. I will place holding orders. Doubt pulmonary embolus. Doubt dissection. Some of her pain is concerning as it is worse with exertion and better with rest but also very atypical as it is better with rubbing her chest as well, worse with bending over, drinking.       EKG Interpretation  Date/Time:  Sunday November 25 2015 00:35:22 EST Ventricular Rate:  70 PR Interval:    QRS Duration: 206 QT Interval:  508 QTC Calculation: 548 R Axis:   -20 Text Interpretation:  Ventricular-paced rhythm Abnormal ECG No significant change since last tracing Confirmed by Tylicia Sherman,  DO, Meghana Tullo [id number]) on 11/25/2015 1:06:40 AM       I personally performed the services described in this documentation, which was scribed in my presence. The recorded  information has been reviewed and is accurate.    [place], DO 11/25/15 0201

## 2015-11-25 NOTE — Progress Notes (Signed)
ANTICOAGULATION CONSULT NOTE - Initial Consult  Pharmacy Consult for heparin Indication: chest pain/ACS  Allergies  Allergen Reactions  . Ace Inhibitors Other (See Comments)    REACTION: angioedema    Patient Measurements: Height: 5\' 6"  (167.6 cm) Weight: 181 lb 8 oz (82.328 kg) IBW/kg (Calculated) : 59.3 Heparin Dosing Weight: ~80 kg  Vital Signs: Temp: 97.5 F (36.4 C) (12/11 0406) BP: 154/90 mmHg (12/11 0406) Pulse Rate: 70 (12/11 0406)  Labs:  Recent Labs  11/25/15 0030 11/25/15 0654  HGB 10.0*  --   HCT 32.0*  --   PLT 227  --   CREATININE 1.37*  --   TROPONINI  --  0.19*    Estimated Creatinine Clearance: 40.7 mL/min (by C-G formula based on Cr of 1.37).   Medical History: Past Medical History  Diagnosis Date  . Angioedema     2/2 ACE  . RLS (restless legs syndrome)     Dx 06/2007  . Anemia   . Arteriovenous malformation of stomach   . Permanent atrial fibrillation ([place])   . Renal failure     baseline creatinine 1.6  . Hyperlipidemia   . Depression   . Diastolic heart failure   . Arthritis   . Sleep apnea   . Asthma   . Hx of colonic polyp 08/13/10    adenomatous  . Fatty liver 07/26/10  . Panic attacks   . Hypertension   . Complete heart block ([place])     s/p PPM 1998  . Hypertrophic cardiomyopathy ([place])     dx by Dr [name] 2009  . Hx of colonoscopy   . GI bleed   . Panic attack   . DDD (degenerative disc disease)   . History of alcohol abuse Stopped Fall 2012  . History of tobacco use Quit Fall 2012  . Shortness of breath     sob on exertation  . CHF (congestive heart failure) ([place])   . Angina   . GERD (gastroesophageal reflux disease)   . AVM (arteriovenous malformation) of colon     small intestine; stomach  . Right arm pain 01/08/2012  . Hyperlipidemia   . Blood transfusion   . Hx of cardiovascular stress test     a. Lexiscan Myoview (10/15):  Small inferolateral and apical defect c/w scar and poss soft tissue attenuation, no  ischemia, EF 42%  . Iron deficiency anemia     Assessment: 69 yo F presents on 12/11 with chest pain. PMH significant for HTN, HLD, complete heart block, and Afib. Pharmacy consulted to start heparin. Not on any anticoag PTA for Afib, just rate controlled with carvedilol. Hgb low at 10, plts wnl. No s/s of bleed  Goal of Therapy:  Heparin level 0.3-0.7 units/ml Monitor platelets by anticoagulation protocol: Yes   Plan:  Give 4,000 unit heparin BOLUS Start heparin gtt at 1,000 units/hr Check 8 hr HL Monitor daily HL, CBC, s/s of bleed

## 2015-11-25 NOTE — Progress Notes (Signed)
Pt had 10 bt run of vtach. VSS and asymptomatic. Dr. Aundra Dubin on floor and notified, will continue to closely monitor.

## 2015-11-25 NOTE — Consult Note (Signed)
CARDIOLOGY CONSULT NOTE  Patient ID: [name] MRN: [id number] DOB/AGE: 1946/08/10 69 y.o.  Admit date: 11/25/2015 Primary Cardiologist: Dr. Rayann Heman Reason for Consultation: Chest pain/dyspnea  HPI: 69 yo with history of chronic atrial fibrillation, hypertrophic cardiomyopathy/CHF, and complete heart block/St Jude PPM was admitted with dyspnea, heart fluttering, and chest pain.  Patient reports increased dyspnea x 2 months.  She has been short of breath walking up an incline, bending over, or doing housework.  This has gradually worsened.  For the last weeks, she has had left lower chest aching on and off.  Initially, pain would last < 1 minute.  More recently, it has lasted longer.  No particular trigger, usually occurs at rest.  Finally, she has noted a fluttering sensation in her chest from time to time recently.    She came to the ER last night with the chest pain.  CXR looked like pulmonary edema.  TnI was elevated mildly at 0.19.  She has received 2 doses of IV Lasix now.   Review of systems complete and found to be negative unless listed above in HPI  Past Medical History: 1. Chronic atrial fibrillation: Not anticoagulated due to GI bleeding history. 2. Hypertrophic cardiomyopathy: Echo (7/15) with severe LVH, EF 40-45%, septal-lateral dyssynchrony, moderately decreased RV systolic function, PASP 65 mmHg.  3. CKD 4. Depression 5. Complete heart block: St Jude PPM 1998.  6. GERD 7. GI bleeding: Stomach and small bowel AVMs.  8. HTN 9. Hyperlipidemia 10. Polymyalgia rheumatica: on prednisone.   Family History  Problem Relation Age of Onset  . Hypertension Mother   . Stroke Mother   . Hypertension Father   . Stroke Father   . Hypertension Sister   . Hypertension Brother   . Colon cancer Brother   . Diabetes Sister   . Diabetes Brother   . Cancer Brother   . Heart attack Brother   . Heart attack Father   . Heart disease Mother   . Heart disease Sister     . Alcohol abuse Brother   . Alcohol abuse Father   . Aneurysm Mother   . CVA Mother     Social History   Social History  . Marital Status: Divorced    Spouse Name: N/A  . Number of Children: 4  . Years of Education: 9   Occupational History  . disabled   . previously- house cleaning    Social History Main Topics  . Smoking status: Former Smoker -- 0.10 packs/day for 50 years    Types: Cigarettes    Quit date: 08/16/2011  . Smokeless tobacco: Never Used     Comment: quit 2012  . Alcohol Use: No     Comment: quit 08/16/2011  . Drug Use: No  . Sexual Activity: Not on file   Other Topics Concern  . Not on file   Social History Narrative   On disability for heart failure/pacemaker.     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    4 grown children,  8 grandchildren.     Drinks alcohol a few times a week socially. At one time, the most she reports drinking is about 3 mixed drinks.   Lives with daughter [name] and son. Will be moving 04/2011 to different house because concerned children are taking advantage of her financial situation.    No illicit drugs.   Smokes few cigarettes daily, especially when stressed. Started smoking @ age 65. Quit January 2012 2/2  health but started smoking again 02/2011.       Health Care POA:    Emergency Contact: daughter, [name] [phone]   End of Life Plan: gave pt AD info pamphlet   Who lives with you: no one- lives in section 8 housing   Any pets: none   Diet: Pt has a varied diet of protein, starch and vegetables   Exercise: Pt has no regular exercise routine.   Seatbelts: Pt reports wearing seatbelt when in vehicles.    Hobbies: dancing     Facility-administered medications prior to admission  Medication Dose Route Frequency Provider Last Rate Last Dose  . cyanocobalamin ((VITAMIN B-12)) injection 1,000 mcg  1,000 mcg Intramuscular Q30 days [name], MD   1,000 mcg at 09/16/11 1341   Prescriptions prior to  admission  Medication Sig Dispense Refill Last Dose  . acetaminophen (TYLENOL) 325 MG tablet Take 325 mg by mouth daily as needed (pain).   Taking  . ALPRAZolam (XANAX) 0.5 MG tablet TAKE 1 TABLET BY MOUTH EVERY DAY AS NEEDED FOR ANXIETY OR SLEEP 15 tablet 1   . aspirin EC 81 MG tablet Take 81 mg by mouth daily. Pt states she sometimes takes 1/2 of a 325 mg instead of 81 mg daily   Taking  . Calcium-Vitamin D 600-200 MG-UNIT per tablet Take 1 tablet by mouth as directed.   Taking  . carvedilol (COREG) 25 MG tablet TAKE 1 TABLET BY MOUTH TWICE DAILY 60 tablet 5   . esomeprazole (NEXIUM) 40 MG capsule TAKE ONE CAPSULE BY MOUTH EVERY MORNING BEFORE BREAKFAST 90 capsule 3 Taking  . ferrous sulfate 325 (65 FE) MG tablet TAKE 1 TABLET BY MOUTH TWICE DAILY WITH MEALS TO KEEP BLOOD COUNT UP 60 tablet 11 Taking  . furosemide (LASIX) 20 MG tablet Take 1 tablet (20 mg total) by mouth daily. Except for Monday, Wednesday and Friday take 40 mg by mouth daily. 135 tablet 3 Taking  . lidocaine-prilocaine (EMLA) cream Apply 1 application topically daily as needed (pain).   Taking  . loratadine (CLARITIN) 10 MG tablet Take 10 mg by mouth daily as needed for allergies.   Taking  . nitroGLYCERIN (NITROSTAT) 0.4 MG SL tablet Place 1 tablet (0.4 mg total) under the tongue every 5 (five) minutes as needed for chest pain. 25 tablet 1 Taking  . Omega-3 Fatty Acids (FISH OIL PO) Take 1 capsule by mouth as directed.   Taking  . predniSONE (DELTASONE) 10 MG tablet TAKE 1 TABLET(10 MG) BY MOUTH DAILY WITH BREAKFAST 30 tablet 1   . rosuvastatin (CRESTOR) 20 MG tablet TAKE 1 TABLET BY MOUTH DAILY ON MONDAY, WEDNESDAY, AND FRIDAY (Patient taking differently: TAKE 1 TABLET BY MOUTH DAILY ON MONDAY, WEDNESDAY, AND FRIDAY. Patient states she only takes this occasionally because she gets cramps with it) 40 tablet 3 Taking  . senna (SENOKOT) 8.6 MG tablet Take 1 tablet by mouth daily.   Taking  . sucralfate (CARAFATE) 1 G tablet TAKE 1  TABLET BY MOUTH TWICE DAILY 60 tablet 3   . Tdap (BOOSTRIX) 5-2.5-18.5 LF-MCG/0.5 injection Inject 0.5 mLs into the muscle once. 0.5 mL 0   . traZODone (DESYREL) 50 MG tablet TAKE 1/2 TO 1 TABLET(25 TO 50 MG) BY MOUTH AT BEDTIME AS NEEDED FOR SLEEP 90 tablet 0 Taking  . triamcinolone cream (KENALOG) 0.1 % Apply 1 application topically 2 (two) times daily as needed (itching).   Taking  . zoster vaccine live, PF, (ZOSTAVAX) 16109 UNT/0.65ML injection  Inject 19,400 Units into the skin once. 1 each 0    Current Scheduled Meds: . aspirin EC  81 mg Oral Daily  . calcium-vitamin D  1 tablet Oral Daily  . carvedilol  25 mg Oral BID WC  . docusate sodium  100 mg Oral BID  . furosemide  40 mg Intravenous BID  . heparin  5,000 Units Subcutaneous 3 times per day  . [START ON 11/26/2015] Influenza vac split quadrivalent PF  0.5 mL Intramuscular Tomorrow-1000  . pantoprazole  80 mg Oral Q1200  . polyethylene glycol  17 g Oral Daily  . predniSONE  10 mg Oral Q breakfast  . [START ON 11/26/2015] rosuvastatin  20 mg Oral Q M,W,F  . sucralfate  1 g Oral BID WC   Continuous Infusions:  PRN Meds:.acetaminophen, nitroGLYCERIN, ondansetron (ZOFRAN) IV  Physical exam Blood pressure 154/90, pulse 70, temperature 97.5 F (36.4 C), temperature source Oral, resp. rate 18, height 5\' 6"  (1.676 m), weight 181 lb 8 oz (82.328 kg), SpO2 98 %. General: NAD Neck: JVP 14 cm, no thyromegaly or thyroid nodule.  Lungs: Clear to auscultation bilaterally with normal respiratory effort. CV: Nondisplaced PMI.  Heart regular S1/S2, no S3/S4, 1/6 SEM RUSB.   Abdomen: Soft, nontender, no hepatosplenomegaly, no distention.  Skin: Intact without lesions or rashes.  Neurologic: Alert and oriented x 3.  Psych: Normal affect. Extremities: No clubbing or cyanosis.  HEENT: Normal.   Labs:   Lab Results  Component Value Date   WBC 3.8* 11/25/2015   HGB 10.0* 11/25/2015   HCT 32.0* 11/25/2015   MCV 90.9 11/25/2015   PLT 227  11/25/2015    Recent Labs Lab 11/25/15 0030  NA 140  K 4.2  CL 105  CO2 25  BUN 21*  CREATININE 1.37*  CALCIUM 9.3  GLUCOSE 142*   Lab Results  Component Value Date   CKTOTAL 58 04/05/2015   CKMB 2.7 08/27/2012   TROPONINI 0.19* 11/25/2015    BNP 436   Radiology: - CXR: Consistent with CHF  EKG: Underlying coarse afib versus atypical flutter with v-pacing.   ASSESSMENT AND PLAN: 69 yo with history of chronic atrial fibrillation, hypertrophic cardiomyopathy/CHF, and complete heart block/St Jude PPM was admitted with dyspnea, heart fluttering, and chest pain.  1. Chest pain: Atypical, however troponin is elevated to 0.19.  No history of coronary disease.  She is volume overloaded on exam, so elevation in troponin may be demand ischemia.  ECG is paced.  Currently, no chest pain.   - Cycle troponin, if trending upwards more likely ACS.   - Stop [place] heparin and will use IV heparin for now.  - Continue ASA, statin.  - Will make decision on ischemic workup (cath versus Cardiolite) based on troponin trend and symptoms. Will keep NPO at midnight.  2. Acute on chronic systolic CHF: Patient has history of hypertrophic cardiomyopathy, last echo with EF 40-45% and severe LVH.  No mention of mitral valve SAM or LVOT gradient. On exam today, she is volume overloaded.  CXR is wet and BNP is elevated.  - Lasix 40 mg IV every 8 hrs, replete K.  - Continue Coreg.   - If creatinine remains stable, start lisinopril.  - Needs repeat echo.  3. CKD: Follow creatinine closely with diuresis.  4. Atrial fibrillation: Chronic, not anticoagulated due to history of GI bleeding (from AVMs).  In the future, may be Watchman candidate (sees Dr Rayann Heman already).   Signed: Loralie Champagne 11/25/2015 2:17 PM

## 2015-11-25 NOTE — ED Notes (Signed)
Family medicine at bedside.

## 2015-11-25 NOTE — H&P (Signed)
[facility] Admission History and Physical Service Pager: [phone]  Patient name: [name] Medical record number: [id number] Date of birth: 30-Nov-1946 Age: 69 y.o. Gender: female  Primary Care Provider: Tawanna Sat, MD Consultants: None Code Status: FULL  Chief Complaint: chest pain  Assessment and Plan: Marionna Gutierres is a 69 y.o. female presenting with chest pain. PMH is significant for HTN, HLD, complete heart block s/p pacemaker, and atrial fibrillation.   Chest pain/ACS rule out: EKG with ventricular-paced rhythm, however prior EKGs suggest that patient is continuously paced. Last echo in 2015 showed EF 40-45%. Patient received ASA and nitroglycerin in ED with improvement of pain. Patient hemodynamically stable without hypoxia in ED. Patient also reports history of anxiety attacks, which may also be cause of/contributing to her pain/"flutters."  - Place in observation with telemetry, attending Dr. Ardelia Mems - Repeat EKG in AM - Cycle troponins - F/u pacemaker interrogation - Echo - Consult cardiology in AM - Cont ASA 81 mg - Nitro PRN  CHF: Patient appears volume overloaded on exam, and CXR shows mild pulmonary edema, vascular congestion, and progressive cardiomegaly concerning for CHF. BNP elevated at 436. - AM BMP - IV Lasix 40 mg bid - Strict I/Os, daily weights   A.fib:  - Continue carvedilol  HTN: BP 140/mid 90s in ED.   - Continue Lasix  HLD - Continue Crestor   Polymyalgia rheumatica - Continue daily prednisone  FEN/GI: Prilosec,carafate, heart healthy diet Prophylaxis: subQ heparin  Disposition: Place in observation with telemetry  History of Present Illness:  [name] is a 69 y.o. female presenting with chest pain.   Patient reports experiencing intermittent "heart flutters" for the past year and a half. She describes the sensation as a "scary feeling," not a pain. This has been occuring more  frequently in the past two weeks. These episodes may begin at rest or during exertion. It begins suddenly and spontaneously resolves. She has been seen by her cardiologist in the past for this, who thought it might be due to medication she is taking, or constipation. She has also been more short of breath recently, and has noticed that she has to sit down frequently when walking or exerting herself.  In the past three days, she began to experience chest pain as well. The fluttering sensation and chest pain do not always occur simultaneously. The pain is improved by rubbing her chest, taking deep breaths, and changing positions (ex standing up or sitting down). Pain is worsened by eating or drinking. The pain is located in her L chest and does not radiate. She has had associated nausea, SOB, and lightheadedness during episodes of pain. She denies diaphoresis during pain, but has woken up the past two nights very sweaty.  The patient has a history of anxiety for which she takes medication, and says she has recently had anxiety attacks. She also reports worsening constipation since the flutters began to occur more frequently and the pain began. She attributes constipation to iron supplements she takes.    Review Of Systems: Per HPI with the following additions: none.  Otherwise the remainder of the systems were negative.  Patient Active Problem List   Diagnosis Date Noted  . Chest pain 11/25/2015  . Polymyalgia rheumatica ([place]) 04/10/2015  . Low back pain 03/28/2015  . Hypertrophic cardiomyopathy ([place]) 02/15/2015  . Eczema 09/18/2014  . Environmental allergies 09/18/2014  . Pre-diabetes 11/26/2013  . COPD, moderate ([place]) 11/01/2013  . Preventative health care 08/18/2012  . Complete  heart block ([place]) 04/03/2011  . Anemia 02/17/2011  . AVM (arteriovenous malformation) 02/13/2011  . PACEMAKER-St.Jude 11/28/2010  . CHRONIC KIDNEY DISEASE STAGE III (MODERATE) 09/24/2010  . HFrEF (heart failure with  reduced ejection fraction) ([place]) 08/30/2010  . ARTHRITIS 07/24/2010  . ANXIETY 07/23/2010  . Chronic atrial fibrillation ([place]) 07/16/2010  . HLD (hyperlipidemia) 02/11/2007  . Hypertension 02/11/2007  . APNEA, SLEEP 02/11/2007    Past Medical History: Past Medical History  Diagnosis Date  . Angioedema     2/2 ACE  . RLS (restless legs syndrome)     Dx 06/2007  . Anemia   . Arteriovenous malformation of stomach   . Permanent atrial fibrillation ([place])   . Renal failure     baseline creatinine 1.6  . Hyperlipidemia   . Depression   . Diastolic heart failure   . Arthritis   . Sleep apnea   . Asthma   . Hx of colonic polyp 08/13/10    adenomatous  . Fatty liver 07/26/10  . Panic attacks   . Hypertension   . Complete heart block ([place])     s/p PPM 1998  . Hypertrophic cardiomyopathy ([place])     dx by Dr [name] 2009  . Hx of colonoscopy   . GI bleed   . Panic attack   . DDD (degenerative disc disease)   . History of alcohol abuse Stopped Fall 2012  . History of tobacco use Quit Fall 2012  . Shortness of breath     sob on exertation  . CHF (congestive heart failure) ([place])   . Angina   . GERD (gastroesophageal reflux disease)   . AVM (arteriovenous malformation) of colon     small intestine; stomach  . Right arm pain 01/08/2012  . Hyperlipidemia   . Blood transfusion   . Hx of cardiovascular stress test     a. Lexiscan Myoview (10/15):  Small inferolateral and apical defect c/w scar and poss soft tissue attenuation, no ischemia, EF 42%  . Iron deficiency anemia     Past Surgical History: Past Surgical History  Procedure Laterality Date  . Tubal ligation  04/01/2000  . Pacemaker insertion  [place], most recent gen change by [name] 4/12  . Cardiac catheterization    . Esophagogastroduodenoscopy  12/23/2011    Procedure: ESOPHAGOGASTRODUODENOSCOPY (EGD);  Surgeon: [name], MD;  Location: [facility];  Service: Endoscopy;  Laterality: N/A;  . Givens capsule  study  12/23/2011    Procedure: GIVENS CAPSULE STUDY;  Surgeon: [name], MD;  Location: [facility] ENDOSCOPY;  Service: Endoscopy;  Laterality: N/A;    Social History: Social History  Substance Use Topics  . Smoking status: Former Smoker -- 0.10 packs/day for 50 years    Types: Cigarettes    Quit date: 08/16/2011  . Smokeless tobacco: Never Used     Comment: quit 2012  . Alcohol Use: No     Comment: quit 08/16/2011   Additional social history: former smoker. Quit four years ago.  Please also refer to relevant sections of EMR.  Family History: Family History  Problem Relation Age of Onset  . Hypertension Mother   . Stroke Mother   . Hypertension Father   . Stroke Father   . Hypertension Sister   . Hypertension Brother   . Colon cancer Brother   . Diabetes Sister   . Diabetes Brother   . Cancer Brother   . Heart attack Brother   . Heart attack  Father   . Heart disease Mother   . Heart disease Sister   . Alcohol abuse Brother   . Alcohol abuse Father   . Aneurysm Mother   . CVA Mother     Allergies and Medications: Allergies  Allergen Reactions  . Ace Inhibitors Other (See Comments)    REACTION: angioedema   No current facility-administered medications on file prior to encounter.   Current Outpatient Prescriptions on File Prior to Encounter  Medication Sig Dispense Refill  . acetaminophen (TYLENOL) 325 MG tablet Take 325 mg by mouth daily as needed (pain).    [name] ALPRAZolam (XANAX) 0.5 MG tablet TAKE 1 TABLET BY MOUTH EVERY DAY AS NEEDED FOR ANXIETY OR SLEEP 15 tablet 1  . aspirin EC 81 MG tablet Take 81 mg by mouth daily. Pt states she sometimes takes 1/2 of a 325 mg instead of 81 mg daily    . Calcium-Vitamin D 600-200 MG-UNIT per tablet Take 1 tablet by mouth as directed.    . carvedilol (COREG) 25 MG tablet TAKE 1 TABLET BY MOUTH TWICE DAILY 60 tablet 5  . esomeprazole (NEXIUM) 40 MG capsule TAKE ONE CAPSULE BY MOUTH EVERY MORNING BEFORE BREAKFAST 90 capsule 3  .  ferrous sulfate 325 (65 FE) MG tablet TAKE 1 TABLET BY MOUTH TWICE DAILY WITH MEALS TO KEEP BLOOD COUNT UP 60 tablet 11  . furosemide (LASIX) 20 MG tablet Take 1 tablet (20 mg total) by mouth daily. Except for Monday, Wednesday and Friday take 40 mg by mouth daily. 135 tablet 3  . lidocaine-prilocaine (EMLA) cream Apply 1 application topically daily as needed (pain).    [name] loratadine (CLARITIN) 10 MG tablet Take 10 mg by mouth daily as needed for allergies.    . nitroGLYCERIN (NITROSTAT) 0.4 MG SL tablet Place 1 tablet (0.4 mg total) under the tongue every 5 (five) minutes as needed for chest pain. 25 tablet 1  . Omega-3 Fatty Acids (FISH OIL PO) Take 1 capsule by mouth as directed.    . predniSONE (DELTASONE) 10 MG tablet TAKE 1 TABLET(10 MG) BY MOUTH DAILY WITH BREAKFAST 30 tablet 1  . rosuvastatin (CRESTOR) 20 MG tablet TAKE 1 TABLET BY MOUTH DAILY ON MONDAY, WEDNESDAY, AND FRIDAY (Patient taking differently: TAKE 1 TABLET BY MOUTH DAILY ON MONDAY, WEDNESDAY, AND FRIDAY. Patient states she only takes this occasionally because she gets cramps with it) 40 tablet 3  . senna (SENOKOT) 8.6 MG tablet Take 1 tablet by mouth daily.    . sucralfate (CARAFATE) 1 G tablet TAKE 1 TABLET BY MOUTH TWICE DAILY 60 tablet 3  . Tdap (BOOSTRIX) 5-2.5-18.5 LF-MCG/0.5 injection Inject 0.5 mLs into the muscle once. 0.5 mL 0  . traZODone (DESYREL) 50 MG tablet TAKE 1/2 TO 1 TABLET(25 TO 50 MG) BY MOUTH AT BEDTIME AS NEEDED FOR SLEEP 90 tablet 0  . triamcinolone cream (KENALOG) 0.1 % Apply 1 application topically 2 (two) times daily as needed (itching).    . zoster vaccine live, PF, (ZOSTAVAX) 16109 UNT/0.65ML injection Inject 19,400 Units into the skin once. 1 each 0    Objective: BP 146/100 mmHg  Pulse 77  Temp(Src) 98.2 F (36.8 C) (Oral)  Resp 21  SpO2 99% Exam: General: well-appearing elderly female lying in bed in NAD Eyes: PERRLA, EOMI ENTM: MMM, no pharyngeal erythema or exudates Neck: full  ROM Cardiovascular: RRR, no murmurs appreciated, LE warm and well-perfused with trace to 1+ pitting edema bilaterally Respiratory: prolonged expiratory phase, poor air movement throughout, no  wheezes, no crackles, no increased WOB Abdomen: soft, non-tender, non-distended, +BS MSK: moving all extremities spontaneously Skin: no rashes or bruises noted Neuro: A&Ox3, no focal deficits Psych: appropriate mood and affect  Labs and Imaging: CBC BMET   Recent Labs Lab 11/25/15 0030  WBC 3.8*  HGB 10.0*  HCT 32.0*  PLT 227    Recent Labs Lab 11/25/15 0030  NA 140  K 4.2  CL 105  CO2 25  BUN 21*  CREATININE 1.37*  GLUCOSE 142*  CALCIUM 9.3     Dg Chest 2 View  11/25/2015  CLINICAL DATA:  Chest pain and shortness of breath tonight. EXAM: CHEST  2 VIEW COMPARISON:  02/22/2015 FINDINGS: Dual lead right-sided pacemaker remains in place. Cardiomegaly, mildly progressed from prior. There is vascular congestion and perihilar pulmonary edema. No pleural effusion. No confluent airspace disease. No acute osseous abnormality. IMPRESSION: Mild pulmonary edema, vascular congestion, and progressive cardiomegaly, concerning for CHF. Electronically Signed   By: Jeb Levering M.D.   On: 11/25/2015 01:42    Verner Mould, MD 11/25/2015, 3:59 AM PGY-1, [place] Intern pager: [phone], text pages welcome  FPTS Upper-Level Resident Addendum  I have independently interviewed and examined the patient. I have discussed the above with the original author and agree with their documentation. My edits for correction/addition/clarification are in pink. Please see also any attending notes.   Frazier Richards, MD MPH PGY-3, [place] Service pager: [phone] (text pages welcome through [facility])

## 2015-11-25 NOTE — Care Management Obs Status (Signed)
[facility] NOTIFICATION   Patient Details  Name: [name] MRN: [id number] Date of Birth: [birth year]   Medicare Observation Status Notification Given:  Yes    Apolonio Schneiders, RN 11/25/2015, 12:11 PM

## 2015-11-25 NOTE — ED Notes (Addendum)
Pt reports sob and palpitations with cp progressively getting worse over past couple months. Pain tonight comes about every 15-20 mins.  Pt has pacemaker.

## 2015-11-25 NOTE — Progress Notes (Signed)
FPTS Interim Progress Note  S: Patient doing well this morning. She states her chest pain is much improved from when she first came in. Chest pain at presentation 8/9 out of 10. Now rating it a 4. She no longer endorses shortness of breath. States that her chest pain was mostly exertional.  She wants to know if she can have something for constipation.   O: BP 154/90 mmHg  Pulse 70  Temp(Src) 97.5 F (36.4 C) (Oral)  Resp 18  Ht 5\' 6"  (1.676 m)  Wt 181 lb 8 oz (82.328 kg)  BMI 29.31 kg/m2  SpO2 98%   General: well-appearing, lying in bed in NAD Cardiovascular: RRR, no murmurs appreciated, no chest tenderness Respiratory: CTAB, no wheezes, no crackles, no increased WOB Abdomen: soft, non-tender Extremities: no edema Neuro: A&Ox3, no focal deficits  A/P: Chest pain/ACS rule-out: Chest pain improved this morning after nitro and ASA. Please see H&P from today for full details of plan.  -continue to montior -cardiology consulted this morning -troponin elevated to .19 this morning (no others to compare to); continue to cycle -EKG this morning with ventricular paced rhythm. No significant changes from prior -echo pending  Constipation: Will start bowel regimen. Patient state last bowel movement was Monday.  Katheren Shams, DO 11/25/2015, 10:02 AM PGY-2, [place] Medicine Service pager [phone]

## 2015-11-26 ENCOUNTER — Observation Stay (HOSPITAL_COMMUNITY): Payer: Medicare Other

## 2015-11-26 ENCOUNTER — Observation Stay (HOSPITAL_BASED_OUTPATIENT_CLINIC_OR_DEPARTMENT_OTHER): Payer: Medicare Other

## 2015-11-26 DIAGNOSIS — R079 Chest pain, unspecified: Secondary | ICD-10-CM

## 2015-11-26 DIAGNOSIS — N183 Chronic kidney disease, stage 3 (moderate): Secondary | ICD-10-CM

## 2015-11-26 DIAGNOSIS — R0789 Other chest pain: Secondary | ICD-10-CM | POA: Diagnosis not present

## 2015-11-26 DIAGNOSIS — R002 Palpitations: Secondary | ICD-10-CM | POA: Diagnosis not present

## 2015-11-26 DIAGNOSIS — Z950 Presence of cardiac pacemaker: Secondary | ICD-10-CM | POA: Diagnosis not present

## 2015-11-26 DIAGNOSIS — R0600 Dyspnea, unspecified: Secondary | ICD-10-CM | POA: Diagnosis not present

## 2015-11-26 DIAGNOSIS — I5020 Unspecified systolic (congestive) heart failure: Secondary | ICD-10-CM

## 2015-11-26 DIAGNOSIS — I482 Chronic atrial fibrillation: Secondary | ICD-10-CM

## 2015-11-26 DIAGNOSIS — G2581 Restless legs syndrome: Secondary | ICD-10-CM | POA: Diagnosis not present

## 2015-11-26 DIAGNOSIS — J449 Chronic obstructive pulmonary disease, unspecified: Secondary | ICD-10-CM | POA: Diagnosis not present

## 2015-11-26 LAB — NM MYOCAR MULTI W/SPECT W/WALL MOTION / EF
CHL CUP RESTING HR STRESS: 75 {beats}/min
CSEPED: 5 min
CSEPEDS: 52 s
CSEPEW: 1 METS
Peak HR: 150 {beats}/min

## 2015-11-26 LAB — BASIC METABOLIC PANEL
ANION GAP: 10 (ref 5–15)
BUN: 26 mg/dL — ABNORMAL HIGH (ref 6–20)
CO2: 34 mmol/L — ABNORMAL HIGH (ref 22–32)
Calcium: 10.2 mg/dL (ref 8.9–10.3)
Chloride: 96 mmol/L — ABNORMAL LOW (ref 101–111)
Creatinine, Ser: 1.53 mg/dL — ABNORMAL HIGH (ref 0.44–1.00)
GFR calc Af Amer: 38 mL/min — ABNORMAL LOW (ref >60)
GFR, EST NON AFRICAN AMERICAN: 33 mL/min — AB (ref >60)
Glucose, Bld: 119 mg/dL — ABNORMAL HIGH (ref 65–99)
POTASSIUM: 3.9 mmol/L (ref 3.5–5.1)
SODIUM: 140 mmol/L (ref 135–145)

## 2015-11-26 LAB — MAGNESIUM: MAGNESIUM: 2.3 mg/dL (ref 1.7–2.4)

## 2015-11-26 LAB — CBC
HEMATOCRIT: 34.7 % — AB (ref 36.0–46.0)
HEMOGLOBIN: 10.5 g/dL — AB (ref 12.0–15.0)
MCH: 27.1 pg (ref 26.0–34.0)
MCHC: 30.3 g/dL (ref 30.0–36.0)
MCV: 89.4 fL (ref 78.0–100.0)
Platelets: 228 10*3/uL (ref 150–400)
RBC: 3.88 MIL/uL (ref 3.87–5.11)
RDW: 14.1 % (ref 11.5–15.5)
WBC: 4.3 10*3/uL (ref 4.0–10.5)

## 2015-11-26 MED ORDER — TECHNETIUM TC 99M SESTAMIBI - CARDIOLITE
30.0000 | Freq: Once | INTRAVENOUS | Status: AC | PRN
Start: 2015-11-26 — End: 2015-11-26
  Administered 2015-11-26: 30 via INTRAVENOUS

## 2015-11-26 MED ORDER — ENOXAPARIN SODIUM 40 MG/0.4ML ~~LOC~~ SOLN
40.0000 mg | SUBCUTANEOUS | Status: DC
  Administered 2015-11-26: 40 mg via SUBCUTANEOUS
  Filled 2015-11-26: qty 0.4

## 2015-11-26 MED ORDER — TECHNETIUM TC 99M SESTAMIBI GENERIC - CARDIOLITE
10.0000 | Freq: Once | INTRAVENOUS | Status: AC | PRN
  Administered 2015-11-26: 10 via INTRAVENOUS

## 2015-11-26 MED ORDER — REGADENOSON 0.4 MG/5ML IV SOLN
0.4000 mg | Freq: Once | INTRAVENOUS | Status: AC
  Administered 2015-11-26: 0.4 mg via INTRAVENOUS
  Filled 2015-11-26: qty 5

## 2015-11-26 MED ORDER — REGADENOSON 0.4 MG/5ML IV SOLN
INTRAVENOUS | Status: AC
  Administered 2015-11-26: 0.4 mg via INTRAVENOUS
  Filled 2015-11-26: qty 5

## 2015-11-26 MED ORDER — ALUM & MAG HYDROXIDE-SIMETH 200-200-20 MG/5ML PO SUSP
30.0000 mL | Freq: Once | ORAL | Status: AC
  Administered 2015-11-26: 30 mL via ORAL
  Filled 2015-11-26: qty 30

## 2015-11-26 MED ORDER — HEPARIN (PORCINE) IN NACL 100-0.45 UNIT/ML-% IJ SOLN
800.0000 [IU]/h | INTRAMUSCULAR | Status: DC
  Administered 2015-11-26: 800 [IU]/h via INTRAVENOUS

## 2015-11-26 NOTE — Progress Notes (Signed)
Patient ID: Debrianna Guier, female   DOB: [birth year], 69 y.o.   MRN: [id number]    Subjective:  Denies SSCP, palpitations or Dyspnea Some leg cramps.    Objective:  Filed Vitals:   11/25/15 0406 11/25/15 1422 11/25/15 2100 11/26/15 0500  BP: 154/90 127/69 121/72 129/83  Pulse: 70  69 69  Temp: 97.5 F (36.4 C)  98.2 F (36.8 C) 98 F (36.7 C)  TempSrc:      Resp: 18 18 17 18   Height: 5\' 6"  (1.676 m)     Weight: 82.328 kg (181 lb 8 oz)   78.382 kg (172 lb 12.8 oz)  SpO2: 98% 100% 98% 98%    Intake/Output from previous day:  Intake/Output Summary (Last 24 hours) at 11/26/15 0945 Last data filed at 11/26/15 0900  Gross per 24 hour  Intake    920 ml  Output    200 ml  Net    720 ml    Physical Exam: Affect appropriate Chronically ill black female  HEENT: normal Neck supple with no adenopathy JVP normal no bruits no thyromegaly Lungs clear with no wheezing and good diaphragmatic motion Heart:  S1/S2 no murmur, no rub, gallop or click Pacer under left clavicle  PMI normal Abdomen: benighn, BS positve, no tenderness, no AAA no bruit.  No HSM or HJR Distal pulses intact with no bruits No edema Neuro non-focal Skin warm and dry No muscular weakness   Lab Results: Basic Metabolic Panel:  Recent Labs  11/25/15 0030 11/26/15 0520  NA 140 140  K 4.2 3.9  CL 105 96*  CO2 25 34*  GLUCOSE 142* 119*  BUN 21* 26*  CREATININE 1.37* 1.53*  CALCIUM 9.3 10.2  MG  --  2.3   CBC:  Recent Labs  11/25/15 0030 11/26/15 0520  WBC 3.8* 4.3  HGB 10.0* 10.5*  HCT 32.0* 34.7*  MCV 90.9 89.4  PLT 227 228   Cardiac Enzymes:  Recent Labs  11/25/15 0654 11/25/15 1154 11/25/15 1847  TROPONINI 0.19* 0.18* 0.19*    Imaging: Dg Chest 2 View  11/25/2015  CLINICAL DATA:  Chest pain and shortness of breath tonight. EXAM: CHEST  2 VIEW COMPARISON:  02/22/2015 FINDINGS: Dual lead right-sided pacemaker remains in place. Cardiomegaly, mildly progressed from prior.  There is vascular congestion and perihilar pulmonary edema. No pleural effusion. No confluent airspace disease. No acute osseous abnormality. IMPRESSION: Mild pulmonary edema, vascular congestion, and progressive cardiomegaly, concerning for CHF. Electronically Signed   By: Jeb Levering M.D.   On: 11/25/2015 01:42    Cardiac Studies:  ECG:  afib V pacing    Telemetry:  Pacing rate 75    Echo:  11/15  Repeat pending  Study Conclusions  - Left ventricle: The cavity size was normal. There was severe concentric hypertrophy. Systolic function was mildly to moderately reduced. The estimated ejection fraction was in the range of 40% to 45%. Wall motion was normal; there were no regional wall motion abnormalities. Doppler parameters are consistent with restrictive physiology, indicative of decreased left ventricular diastolic compliance and/or increased left atrial pressure. - Ventricular septum: Septal motion showed dyssynergy, and paradox. There is marked septal-lateral and apical-basal dyssynchrony. - Mitral valve: There was mild regurgitation directed centrally. - Left atrium: The atrium was moderately to severely dilated. - Right ventricle: Systolic function was moderately reduced. - Tricuspid valve: There was moderate regurgitation. - Pulmonary arteries: Systolic pressure was moderately increased. PA peak pressure: 65 mm Hg (S).  Medications:   .  aspirin EC  81 mg Oral Daily  . calcium-vitamin D  1 tablet Oral Daily  . carvedilol  25 mg Oral BID WC  . docusate sodium  100 mg Oral BID  . furosemide  40 mg Intravenous 3 times per day  . pantoprazole  80 mg Oral Q1200  . polyethylene glycol  17 g Oral Daily  . potassium chloride  40 mEq Oral BID  . predniSONE  10 mg Oral Q breakfast  . regadenoson  0.4 mg Intravenous Once  . rosuvastatin  20 mg Oral Q M,W,F  . sucralfate  1 g Oral BID WC     . heparin 800 Units/hr (11/26/15 0121)    Assessment/Plan:    Chest Pain:  Atypical ECG paced no evolution of mildly elevated troponin  D/C heparin with history of GI bleeding  Lexiscan ordered for today Try to avoid Cath with CRF  Pacer:  Normal function   Afib:  Chronic no anticoagulation with history of AVM;s and bleed has seen Dr Rayann Heman may be a candidate for Watchman device.  DCM:  Continue lasix no LVOT murmur echo pending   PMR:  On steroids  CRF:  Cr 1.5 follow with diuresis avoid contrast/ cath if possible  Jenkins Rouge 11/26/2015, 9:45 AM

## 2015-11-26 NOTE — Progress Notes (Signed)
Family Medicine Teaching Service Daily Progress Note Intern Pager: [phone]  Patient name: [name] Medical record number: [id number] Date of birth: Aug 12, 1946 Age: 69 y.o. Gender: female  Primary Care Provider: Tawanna Sat, MD Consultants: Cardiology Code Status: FULL   Pt Overview and Major Events to Date:  12/11: Pt admitted for chest pain/ACS rule out   Assessment and Plan: [name] is a 69 y.o. female presenting with chest pain. PMH is significant for HTN, HLD, complete heart block s/p pacemaker, and atrial fibrillation.   Chest pain/ACS rule out: EKG with ventricular-paced rhythm, however prior EKGs suggest that patient is continuously paced. Last echo in 2015 showed EF 40-45%. Patient received ASA and nitroglycerin in ED with improvement of pain. Patient hemodynamically stable without hypoxia in ED. Patient also reports history of anxiety attacks, which may also be cause of/contributing to her pain/"flutters."  - Place in observation with telemetry, attending Dr. Ardelia Mems - Cycle troponins: 0.19 >> 0.18 >> 0.19  - F/u pacemaker interrogation - Echo - cardiology consulted, appreciate recs >>> making decision regarding ischemic workup today; has been NPO since midnight  - Cont ASA 81 mg and statin  - Nitro PRN -IV heparin for now    CHF: Patient appears volume overloaded on exam, and CXR shows mild pulmonary edema, vascular congestion, and progressive cardiomegaly concerning for CHF. BNP elevated at 436. - AM BMP - IV Lasix 40 mg q8hr with Kdur 65mEq BID  - Strict I/Os, daily weights  - mild increase in Cr with diuresis (1.37 --> 1.53)   A.fib:  - Continue carvedilol  HTN: BP 140/mid 90s in ED.  - Continue Lasix  HLD - Continue Crestor   Polymyalgia rheumatica - Continue daily prednisone  FEN/GI: Prilosec,carafate, heart healthy diet Prophylaxis: IV heparin   Disposition: Home pending cardiology work up   Subjective:  Patient  states she is feeling better. Admits to some mild CP at rest but much improved. Denies dyspnea.   Objective: Temp:  [98 F (36.7 C)-98.2 F (36.8 C)] 98 F (36.7 C) (12/12 0500) Pulse Rate:  [69] 69 (12/12 0500) Resp:  [17-18] 18 (12/12 0500) BP: (121-129)/(69-83) 129/83 mmHg (12/12 0500) SpO2:  [98 %-100 %] 98 % (12/12 0500) Weight:  [172 lb 12.8 oz (78.382 kg)] 172 lb 12.8 oz (78.382 kg) (12/12 0500) Physical Exam: General: well appearing, lying in be din AND  Cardiovascular: RRR. No murmurs appreciated. No chest tenderness.  Respiratory: CTAB. Normal WOB.  Abdomen: soft, NTND  Extremities: A&Ox3, no focal deficits   Laboratory:  Recent Labs Lab 11/25/15 0030 11/26/15 0520  WBC 3.8* 4.3  HGB 10.0* 10.5*  HCT 32.0* 34.7*  PLT 227 228    Recent Labs Lab 11/25/15 0030 11/26/15 0520  NA 140 140  K 4.2 3.9  CL 105 96*  CO2 25 34*  BUN 21* 26*  CREATININE 1.37* 1.53*  CALCIUM 9.3 10.2  GLUCOSE 142* 119*    Troponin 0.19 >> 0.18 >> 0.19   Imaging/Diagnostic Tests: Dg Chest 2 View  11/25/2015  CLINICAL DATA:  Chest pain and shortness of breath tonight. EXAM: CHEST  2 VIEW COMPARISON:  02/22/2015 FINDINGS: Dual lead right-sided pacemaker remains in place. Cardiomegaly, mildly progressed from prior. There is vascular congestion and perihilar pulmonary edema. No pleural effusion. No confluent airspace disease. No acute osseous abnormality. IMPRESSION: Mild pulmonary edema, vascular congestion, and progressive cardiomegaly, concerning for CHF. Electronically Signed   By: Jeb Levering M.D.   On: 11/25/2015 01:42    [name]  Michae Kava, DO 11/26/2015, 7:22 AM PGY-1, [place] Intern pager: [phone], text pages welcome

## 2015-11-26 NOTE — Progress Notes (Signed)
Patient presented for Lexiscan. Tolerated procedure well. Result to follow.   Kastiel Simonian, PAC 

## 2015-11-26 NOTE — Discharge Summary (Signed)
[facility] Discharge Summary  Patient name: [name] Medical record number: [id number] Date of birth: 04/02/1946 Age: 69 y.o. Gender: female Date of Admission: 11/25/2015  Date of Discharge: 11/27/2015 Admitting Physician: Leeanne Rio, MD  Primary Care Provider: Tawanna Sat, MD Consultants: Cardiology   Indication for Hospitalization: Chest Pain/ACS rule out   Discharge Diagnoses/Problem List:  Patient Active Problem List   Diagnosis Date Noted  . Chest pain 11/25/2015  . Dyspnea on exertion   . Polymyalgia rheumatica ([place]) 04/10/2015  . Low back pain 03/28/2015  . Hypertrophic cardiomyopathy ([place]) 02/15/2015  . Eczema 09/18/2014  . Environmental allergies 09/18/2014  . Pre-diabetes 11/26/2013  . COPD, moderate ([place]) 11/01/2013  . Preventative health care 08/18/2012  . Complete heart block ([place]) 04/03/2011  . Anemia 02/17/2011  . AVM (arteriovenous malformation) 02/13/2011  . PACEMAKER-[place] 11/28/2010  . CHRONIC KIDNEY DISEASE STAGE III (MODERATE) 09/24/2010  . HFrEF (heart failure with reduced ejection fraction) ([place]) 08/30/2010  . ARTHRITIS 07/24/2010  . ANXIETY 07/23/2010  . Chronic atrial fibrillation ([place]) 07/16/2010  . HLD (hyperlipidemia) 02/11/2007  . Hypertension 02/11/2007  . APNEA, SLEEP 02/11/2007    Disposition: Home  Discharge Condition: Improved   Discharge Exam:  General: well appearing, NAD  Cardiovascular: RRR. No murmurs appreciated. No chest tenderness.  Respiratory: CTAB. Normal WOB.  Abdomen: soft, NTND  Extremities: A&Ox3, no focal deficits   Brief Hospital Course:  [name] is 69 y.o. female with PMH of complete heart block (pacemaker in place) and AFib who presented with chest pain and worsening palpitations. EKG showed ventricularly paced rhythm and troponin was mildly elevated to 0.19. Cardiology was consulted and performed Lexiscan stress test. The test was negative  for ischemia or arrhythmias. She was cleared for discharge from a cardiology stand point with no indication for cath.   Additionally, patient appeared to be volume overloaded on exam at presentation. BNP was elevated to 436 and CXR had findings concerning for CHF. An echo was ordered which showed normal systolic function (EF [id number]) and Grade II diastolic dysfunction. Patient was diuresed with IV lasix for 2 days. Her Cr was at her baseline at admission but began to rise with diuresis. On day of discharge her Cr was 2.34. Electrolytes were stable. IV lasix was discontinued and patient was instructed to hold her PO lasix for one day.   Issues for Follow Up:  1. Recheck BMET to follow up on Cr levels 2. Patient complained of some leg cramps while hospitalized. All electrolytes were normal. This could have been a result of diuresis.   Significant Procedures: None   Significant Labs and Imaging:   Recent Labs Lab 11/25/15 0030 11/26/15 0520 11/27/15 0540  WBC 3.8* 4.3 4.4  HGB 10.0* 10.5* 11.4*  HCT 32.0* 34.7* 37.5  PLT 227 228 275    Recent Labs Lab 11/25/15 0030 11/26/15 0520 11/27/15 0540  NA 140 140 139  K 4.2 3.9 4.7  CL 105 96* 95*  CO2 25 34* 32  GLUCOSE 142* 119* 115*  BUN 21* 26* 41*  CREATININE 1.37* 1.53* 2.34*  CALCIUM 9.3 10.2 10.1  MG  --  2.3  --    Troponin: 0.19 >> 0.18 >> 0.19   Dg Chest 2 View  11/25/2015  CLINICAL DATA:  Chest pain and shortness of breath tonight. EXAM: CHEST  2 VIEW COMPARISON:  02/22/2015 FINDINGS: Dual lead right-sided pacemaker remains in place. Cardiomegaly, mildly progressed from prior. There is vascular congestion and perihilar pulmonary  edema. No pleural effusion. No confluent airspace disease. No acute osseous abnormality. IMPRESSION: Mild pulmonary edema, vascular congestion, and progressive cardiomegaly, concerning for CHF. Electronically Signed   By: Jeb Levering M.D.   On: 11/25/2015 01:42   Nm Myocar Multi W/spect W/wall  Motion / Ef  11/26/2015   There was no ST segment deviation noted during stress.  This is a low risk study.  The left ventricular ejection fraction is mildly decreased (45-54%). There is paradoxical septal motion consistent with paced rhythm.  There are significant artifacts - extracardiac activity and a hot spot on resting images that are affecting interpretation of this study. There is a small perfusion defect in the apical anterior wall on the stress images, however this is unchanged from the study performed on 10/03/2014. No definite ischemia.   Results/Tests Pending at Time of Discharge: None  Discharge Medications:    Medication List    STOP taking these medications        traZODone 50 MG tablet  Commonly known as:  DESYREL      TAKE these medications        acetaminophen 325 MG tablet  Commonly known as:  TYLENOL  Take 325 mg by mouth daily as needed (pain).     ALPRAZolam 0.5 MG tablet  Commonly known as:  XANAX  TAKE 1 TABLET BY MOUTH EVERY DAY AS NEEDED FOR ANXIETY OR SLEEP     aspirin EC 81 MG tablet  Take 81 mg by mouth daily.     Calcium-Vitamin D 600-200 MG-UNIT tablet  Take 1 tablet by mouth daily.     carvedilol 25 MG tablet  Commonly known as:  COREG  TAKE 1 TABLET BY MOUTH TWICE DAILY     esomeprazole 40 MG capsule  Commonly known as:  NEXIUM  TAKE ONE CAPSULE BY MOUTH EVERY MORNING BEFORE BREAKFAST     ferrous sulfate 325 (65 FE) MG tablet  TAKE 1 TABLET BY MOUTH TWICE DAILY WITH MEALS TO KEEP BLOOD COUNT UP     furosemide 20 MG tablet  Commonly known as:  LASIX  Take 1 tablet (20 mg total) by mouth daily. Except for Monday, Wednesday and Friday take 40 mg by mouth daily.     nitroGLYCERIN 0.4 MG SL tablet  Commonly known as:  NITROSTAT  Place 1 tablet (0.4 mg total) under the tongue every 5 (five) minutes as needed for chest pain.     predniSONE 10 MG tablet  Commonly known as:  DELTASONE  TAKE 1 TABLET(10 MG) BY MOUTH DAILY WITH BREAKFAST      rosuvastatin 20 MG tablet  Commonly known as:  CRESTOR  TAKE 1 TABLET BY MOUTH DAILY ON MONDAY, WEDNESDAY, AND FRIDAY     senna 8.6 MG tablet  Commonly known as:  SENOKOT  Take 1 tablet by mouth daily as needed for constipation.     sucralfate 1 G tablet  Commonly known as:  CARAFATE  TAKE 1 TABLET BY MOUTH TWICE DAILY        Discharge Instructions: Please refer to Patient Instructions section of EMR for full details.  Patient was counseled important signs and symptoms that should prompt return to medical care, changes in medications, dietary instructions, activity restrictions, and follow up appointments.   Follow-Up Appointments: Follow-up Information    Follow up with Tawanna Sat, MD. Go on 12/04/2015.   Specialty:  Family Medicine   Why:  For Hospital Followup at 2:15   Contact information:   [id number]  [place] [phone]       Catherine Lauren Wallace, DO 11/27/2015, 12:21 PM PGY-1, [place]

## 2015-11-26 NOTE — Progress Notes (Signed)
  Echocardiogram 2D Echocardiogram has been performed.  Bobbye Charleston 11/26/2015, 1:43 PM

## 2015-11-26 NOTE — Progress Notes (Signed)
[place] for heparin Indication: chest pain/ACS  Allergies  Allergen Reactions  . Ace Inhibitors Other (See Comments)    REACTION: angioedema    Patient Measurements: Height: 5\' 6"  (167.6 cm) Weight: 181 lb 8 oz (82.328 kg) IBW/kg (Calculated) : 59.3 Heparin Dosing Weight: ~80 kg  Vital Signs: Temp: 98.2 F (36.8 C) (12/11 2100) BP: 121/72 mmHg (12/11 2100) Pulse Rate: 69 (12/11 2100)  Labs:  Recent Labs  11/25/15 0030 11/25/15 0654 11/25/15 1154 11/25/15 1847 11/25/15 2308  HGB 10.0*  --   --   --   --   HCT 32.0*  --   --   --   --   PLT 227  --   --   --   --   HEPARINUNFRC  --   --   --   --  1.20*  CREATININE 1.37*  --   --   --   --   TROPONINI  --  0.19* 0.18* 0.19*  --     Estimated Creatinine Clearance: 40.7 mL/min (by C-G formula based on Cr of 1.37).  Assessment: 69 yo Female with chest pain/Afib for heparin. Goal of Therapy:  Heparin level 0.3-0.7 units/ml Monitor platelets by anticoagulation protocol: Yes   Plan:  Hold heparin x 45 min, then decrease heparin 800 units/hr Check heparin level in 8 hours.   Phillis Knack, PharmD, BCPS

## 2015-11-27 DIAGNOSIS — R079 Chest pain, unspecified: Secondary | ICD-10-CM | POA: Diagnosis not present

## 2015-11-27 DIAGNOSIS — N183 Chronic kidney disease, stage 3 (moderate): Secondary | ICD-10-CM | POA: Diagnosis not present

## 2015-11-27 DIAGNOSIS — Z950 Presence of cardiac pacemaker: Secondary | ICD-10-CM | POA: Diagnosis not present

## 2015-11-27 DIAGNOSIS — J449 Chronic obstructive pulmonary disease, unspecified: Secondary | ICD-10-CM | POA: Diagnosis not present

## 2015-11-27 DIAGNOSIS — I482 Chronic atrial fibrillation: Secondary | ICD-10-CM | POA: Diagnosis not present

## 2015-11-27 DIAGNOSIS — R0789 Other chest pain: Secondary | ICD-10-CM | POA: Diagnosis not present

## 2015-11-27 LAB — CBC
HCT: 37.5 % (ref 36.0–46.0)
HEMOGLOBIN: 11.4 g/dL — AB (ref 12.0–15.0)
MCH: 27.3 pg (ref 26.0–34.0)
MCHC: 30.4 g/dL (ref 30.0–36.0)
MCV: 89.9 fL (ref 78.0–100.0)
Platelets: 275 10*3/uL (ref 150–400)
RBC: 4.17 MIL/uL (ref 3.87–5.11)
RDW: 14.2 % (ref 11.5–15.5)
WBC: 4.4 10*3/uL (ref 4.0–10.5)

## 2015-11-27 LAB — BASIC METABOLIC PANEL
Anion gap: 12 (ref 5–15)
BUN: 41 mg/dL — AB (ref 6–20)
CALCIUM: 10.1 mg/dL (ref 8.9–10.3)
CHLORIDE: 95 mmol/L — AB (ref 101–111)
CO2: 32 mmol/L (ref 22–32)
CREATININE: 2.34 mg/dL — AB (ref 0.44–1.00)
GFR calc Af Amer: 23 mL/min — ABNORMAL LOW (ref >60)
GFR calc non Af Amer: 20 mL/min — ABNORMAL LOW (ref >60)
Glucose, Bld: 115 mg/dL — ABNORMAL HIGH (ref 65–99)
Potassium: 4.7 mmol/L (ref 3.5–5.1)
SODIUM: 139 mmol/L (ref 135–145)

## 2015-11-27 MED ORDER — CALCIUM-VITAMIN D 600-200 MG-UNIT PO TABS: 1.0000 | ORAL_TABLET | Freq: Every day | ORAL | Status: DC

## 2015-11-27 NOTE — Plan of Care (Signed)
Problem: Phase I Progression Outcomes Goal: Hemodynamically stable Outcome: Completed/Met Date Met:  11/27/15 Pt VS are stables

## 2015-11-27 NOTE — Discharge Instructions (Signed)
You were hospitalized for chest pain. Your tests were negative for a heart attack. Please do not take your lasix today and start taking it again tomorrow. You will need repeat labs at clinic, so please make sure you go to your follow up appointment.

## 2015-11-27 NOTE — Progress Notes (Signed)
Patient ID: [name], female   DOB: Nov 01, 1946, 69 y.o.   MRN: [id number]    Subjective:  Denies SSCP, palpitations or Dyspnea     Objective:  Filed Vitals:   11/26/15 1305 11/26/15 2033 11/26/15 2038 11/27/15 0613  BP: 90/54 95/56 99/66  106/63  Pulse: 80  88 75  Temp: 97.8 F (36.6 C)  98 F (36.7 C) 97.9 F (36.6 C)  TempSrc: Oral  Oral Oral  Resp: 20  20 18   Height:      Weight:    77.747 kg (171 lb 6.4 oz)  SpO2: 95%  95% 95%    Intake/Output from previous day:  Intake/Output Summary (Last 24 hours) at 11/27/15 0954 Last data filed at 11/26/15 1800  Gross per 24 hour  Intake    360 ml  Output      0 ml  Net    360 ml    Physical Exam: Affect appropriate Chronically ill black female  HEENT: normal Neck supple with no adenopathy JVP normal no bruits no thyromegaly Lungs clear with no wheezing and good diaphragmatic motion Heart:  S1/S2 no murmur, no rub, gallop or click Pacer under left clavicle  PMI normal Abdomen: benighn, BS positve, no tenderness, no AAA no bruit.  No HSM or HJR Distal pulses intact with no bruits No edema Neuro non-focal Skin warm and dry No muscular weakness   Lab Results: Basic Metabolic Panel:  Recent Labs  11/26/15 0520 11/27/15 0540  NA 140 139  K 3.9 4.7  CL 96* 95*  CO2 34* 32  GLUCOSE 119* 115*  BUN 26* 41*  CREATININE 1.53* 2.34*  CALCIUM 10.2 10.1  MG 2.3  --    CBC:  Recent Labs  11/26/15 0520 11/27/15 0540  WBC 4.3 4.4  HGB 10.5* 11.4*  HCT 34.7* 37.5  MCV 89.4 89.9  PLT 228 275   Cardiac Enzymes:  Recent Labs  11/25/15 0654 11/25/15 1154 11/25/15 1847  TROPONINI 0.19* 0.18* 0.19*    Imaging: Nm Myocar Multi W/spect W/wall Motion / Ef  11/26/2015   There was no ST segment deviation noted during stress.  This is a low risk study.  The left ventricular ejection fraction is mildly decreased (45-54%). There is paradoxical septal motion consistent with paced rhythm.  There are  significant artifacts - extracardiac activity and a hot spot on resting images that are affecting interpretation of this study. There is a small perfusion defect in the apical anterior wall on the stress images, however this is unchanged from the study performed on 10/03/2014. No definite ischemia.    Cardiac Studies:  ECG:  afib V pacing    Telemetry:  Pacing rate 75    Echo:  11/15  Repeat pending  Study Conclusions  - Left ventricle: The cavity size was normal. There was severe concentric hypertrophy. Systolic function was mildly to moderately reduced. The estimated ejection fraction was in the range of 40% to 45%. Wall motion was normal; there were no regional wall motion abnormalities. Doppler parameters are consistent with restrictive physiology, indicative of decreased left ventricular diastolic compliance and/or increased left atrial pressure. - Ventricular septum: Septal motion showed dyssynergy, and paradox. There is marked septal-lateral and apical-basal dyssynchrony. - Mitral valve: There was mild regurgitation directed centrally. - Left atrium: The atrium was moderately to severely dilated. - Right ventricle: Systolic function was moderately reduced. - Tricuspid valve: There was moderate regurgitation. - Pulmonary arteries: Systolic pressure was moderately increased. PA peak pressure: 65 mm  Hg (S).  Medications:   . aspirin EC  81 mg Oral Daily  . calcium-vitamin D  1 tablet Oral Daily  . carvedilol  25 mg Oral BID WC  . docusate sodium  100 mg Oral BID  . enoxaparin (LOVENOX) injection  40 mg Subcutaneous Q24H  . pantoprazole  80 mg Oral Q1200  . polyethylene glycol  17 g Oral Daily  . potassium chloride  40 mEq Oral BID  . predniSONE  10 mg Oral Q breakfast  . rosuvastatin  20 mg Oral Q M,W,F  . sucralfate  1 g Oral BID WC        Assessment/Plan:  Chest Pain:  Atypical ECG paced no evolution of mildly elevated troponin  D/C heparin with  history of GI bleeding   Myovue with no ischemia No indication for cath  Pacer:  Normal function   Afib:  Chronic no anticoagulation with history of AVM;s and bleed has seen Dr Rayann Heman may be a candidate for Watchman device.  DCM:  Continue lasix no LVOT murmur echo with normal EF and no significant valve disease or LVOT gradient   PMR:  On steroids  CRF:  Cr 2.34 big jump hold potassium consider observing 24 hrs more before d/c to make sure K/Cr ok  Ok to d/c from cardiac perspective  Jenkins Rouge 11/27/2015, 9:54 AM

## 2015-12-04 ENCOUNTER — Ambulatory Visit (INDEPENDENT_AMBULATORY_CARE_PROVIDER_SITE_OTHER): Payer: Medicare Other | Admitting: Family Medicine

## 2015-12-04 ENCOUNTER — Encounter: Payer: Self-pay | Admitting: Family Medicine

## 2015-12-04 VITALS — BP 109/75 | HR 71 | Temp 97.5°F | Wt 174.3 lb

## 2015-12-04 DIAGNOSIS — M353 Polymyalgia rheumatica: Secondary | ICD-10-CM

## 2015-12-04 DIAGNOSIS — N183 Chronic kidney disease, stage 3 unspecified: Secondary | ICD-10-CM

## 2015-12-04 DIAGNOSIS — N179 Acute kidney failure, unspecified: Secondary | ICD-10-CM | POA: Diagnosis not present

## 2015-12-04 DIAGNOSIS — N189 Chronic kidney disease, unspecified: Secondary | ICD-10-CM

## 2015-12-04 DIAGNOSIS — I5030 Unspecified diastolic (congestive) heart failure: Secondary | ICD-10-CM

## 2015-12-04 LAB — BASIC METABOLIC PANEL WITH GFR
BUN: 44 mg/dL — AB (ref 7–25)
CALCIUM: 10.2 mg/dL (ref 8.6–10.4)
CO2: 27 mmol/L (ref 20–31)
CREATININE: 1.69 mg/dL — AB (ref 0.60–0.93)
Chloride: 99 mmol/L (ref 98–110)
GFR, EST NON AFRICAN AMERICAN: 30 mL/min — AB (ref >=60)
GFR, Est African American: 35 mL/min — ABNORMAL LOW (ref >=60)
GLUCOSE: 134 mg/dL — AB (ref 65–99)
Potassium: 3.8 mmol/L (ref 3.5–5.3)
Sodium: 140 mmol/L (ref 135–146)

## 2015-12-04 NOTE — Patient Instructions (Addendum)
  Lasix: Sunday - take 1 tablet Monday - take 2 tablets Tuesday - take 1 tablet Wednesday - take 2 tablets Thursday - take 1 tablet Friday - take 2 tablets Saturday - take 1 tablet  Schedule an appointment in 1 month to discuss your PMR -- we will probably start titrating your prednisone down (decrease the amount you are taking)

## 2015-12-04 NOTE — Assessment & Plan Note (Signed)
Stable. On prednisone 10mg . Likely start slow taper at follow up next month.

## 2015-12-04 NOTE — Assessment & Plan Note (Signed)
Recent hospitalization for CP, repeat echo showing EF 65-70% but having grade 2 diastolic dysfunction. She appears stable today. She was not taking lasix as prescribed, actually doubling the dose. Breathing currently okay, and weight is not up. Counseled regarding diet, taking medicines as prescribed, coming to clinic if not feeling well before things get worse. She is on a beta blocker, I would be hesitant to start any additional HF medicines today because of her blood pressure, dizziness, and recent AKI. Follow up 1 month.

## 2015-12-04 NOTE — Assessment & Plan Note (Signed)
Re-check BMP today. Instructed on proper dosing of lasix.

## 2015-12-04 NOTE — Progress Notes (Signed)
   Subjective:    Patient ID: [name], female    DOB: [birth year], 69 y.o.   MRN: [id number]  HPI  CC: hospital follow up  # Chest pain, HFpEF:  Continues to have a few "heart flutters" that she calls pain -- notices when leaning over.  Will put hands over her head to make it feel better  Echo EF 65% with grade 2 diastolic dysfunction  She was dizzy this morning ROS: felt SOB this morning but not currently, some lightheadedness  # AKI on CKD  Prior to discharge Cr bumped from 1.53 to 2.34.   Feels she is voiding "normal"  Has been taking lasix 2 tablets every day. She is confused about the instructions on the lasix which state take 2 tablets MWF and 1 tablet all other days.  # PMR  Still on the 10mg  prednisone  No pain in shoulders, hips currently  She is concerned the prednisone is causing some of her heart issues  Social Hx: former smoker  Review of Systems   See HPI for ROS.   Past medical history, surgical, family, and social history reviewed and updated in the EMR as appropriate. Objective:  BP 109/75 mmHg  Pulse 71  Temp(Src) 97.5 F (36.4 C) (Oral)  Wt 174 lb 4.8 oz (79.062 kg) Vitals and nursing note reviewed  General: NAD CV: RRR, normal s1s2, no murmurs appreciated Resp: clear to auscultation bilaterally, no crackles, normal effort Extremities: no LE edema  Assessment & Plan:  (HFpEF) heart failure with preserved ejection fraction (HCC) Recent hospitalization for CP, repeat echo showing EF 65-70% but having grade 2 diastolic dysfunction. She appears stable today. She was not taking lasix as prescribed, actually doubling the dose. Breathing currently okay, and weight is not up. Counseled regarding diet, taking medicines as prescribed, coming to clinic if not feeling well before things get worse. She is on a beta blocker, I would be hesitant to start any additional HF medicines today because of her blood pressure, dizziness, and recent AKI.  Follow up 1 month.  CHRONIC KIDNEY DISEASE STAGE III (MODERATE) Re-check BMP today. Instructed on proper dosing of lasix.  Polymyalgia rheumatica (HCC) Stable. On prednisone 10mg . Likely start slow taper at follow up next month.

## 2015-12-05 ENCOUNTER — Other Ambulatory Visit: Payer: Self-pay | Admitting: Family Medicine

## 2015-12-06 ENCOUNTER — Encounter: Payer: Self-pay | Admitting: Family Medicine

## 2015-12-11 NOTE — Telephone Encounter (Signed)
2nd request.  Savera Donson L, RN  

## 2015-12-14 ENCOUNTER — Encounter: Payer: Self-pay | Admitting: Cardiovascular Disease

## 2015-12-14 ENCOUNTER — Ambulatory Visit (INDEPENDENT_AMBULATORY_CARE_PROVIDER_SITE_OTHER): Payer: Medicare Other | Admitting: Cardiovascular Disease

## 2015-12-14 VITALS — BP 128/94 | HR 70 | Ht 65.0 in | Wt 179.0 lb

## 2015-12-14 DIAGNOSIS — I5032 Chronic diastolic (congestive) heart failure: Secondary | ICD-10-CM | POA: Diagnosis not present

## 2015-12-14 DIAGNOSIS — I5030 Unspecified diastolic (congestive) heart failure: Secondary | ICD-10-CM | POA: Diagnosis not present

## 2015-12-14 DIAGNOSIS — E785 Hyperlipidemia, unspecified: Secondary | ICD-10-CM

## 2015-12-14 DIAGNOSIS — I10 Essential (primary) hypertension: Secondary | ICD-10-CM

## 2015-12-14 DIAGNOSIS — I442 Atrioventricular block, complete: Secondary | ICD-10-CM

## 2015-12-14 NOTE — Patient Instructions (Addendum)
Please try hydrocortisone cream or lotion when your legs are itching.  Dr Oval Linsey has made no changes today in your current medications or treatment plan.  Your physician recommends that you schedule a follow-up appointment in 6 months. You will receive a reminder letter in the mail two months in advance. If you don't receive a letter, please call our office to schedule the follow-up appointment.  If you need a refill on your cardiac medications before your next appointment, please call your pharmacy.

## 2015-12-14 NOTE — Progress Notes (Signed)
Cardiology Office Note   Date:  12/14/2015   ID:  [name], [name] [birth year], MRN [id number]  PCP:  Tawanna Sat, MD  Cardiologist:   Sharol Harness, MD  Electrophysiologist: Thompson Grayer, MD   Chief Complaint  Patient presents with  . New Evaluation  . Shortness of Breath    CHRONIC  . Dizziness  . Chest Pain  . Edema    LEGS    History of Present Illness: [name] is a 69 y.o. female with hypertension, hyperlipidemia, CKD III, chronic atrial fibrillation, chronic diastolic heart failure, complete heart block status post pacemaker ([employer]), hypertrophic cardiomyopathy, and COPD who presents to establish care.  [name] is a patient of Dr. Rayann Heman.  She last saw Dr. Rayann Heman on 7/25, at which time she was doing well and was referred to general cardiology to establish care.  She has chronic atrial fibrillation and is ventricularly paced for complete heart block.  She was admitted to the hospital from 12/11-12/13.  She presented with chest pain.  He was noted to be mildly volume overloaded on exam and BNP was elevated to 436. Chest x-ray was concerning for heart failure.  Echo showed normal systolic function and grade 2 diastolic dysfunction. There was no intracavitary gradient.  She was diuresed with IV Lasix.  Her hospitalization was complicated by acute renal failure and was discharged with a creatinine of 2.3. She followed up with her primary care provider on 12/2 and her creatinine was 1.69.  Troponin was mildly elevated to 0.19. She underwent Lexiscan stress testing that was negative for ischemia.  [name] is not on anticoagulation due to prior GI bleed.   She has not been feeling well since discharge from the hospital. She continues to have palpitations but no longer has chest pain.  This occurs daily 3-4 times per day.  The episodes last for a few minutes at a time and get better with taking a deep breath or rubbing her chest.   The symptoms are not exertional and typically occur at rest. She denies , lightheadedness, or dizziness. She does, however, note lightheadedness when bending over and standing up too quickly. She denies lower extremity edema, orthopnea or PND.   Past Medical History  Diagnosis Date  . Angioedema     2/2 ACE  . RLS (restless legs syndrome)     Dx 06/2007  . Anemia   . Arteriovenous malformation of stomach   . Permanent atrial fibrillation ([place])   . Renal failure     baseline creatinine 1.6  . Hyperlipidemia   . Depression   . Diastolic heart failure   . Arthritis   . Sleep apnea   . Asthma   . Hx of colonic polyp 08/13/10    adenomatous  . Fatty liver 07/26/10  . Panic attacks   . Hypertension   . Complete heart block ([place])     s/p PPM 1998  . Hypertrophic cardiomyopathy ([place])     dx by Dr [name] 2009  . Hx of colonoscopy   . GI bleed   . Panic attack   . DDD (degenerative disc disease)   . History of alcohol abuse Stopped Fall 2012  . History of tobacco use Quit Fall 2012  . Shortness of breath     sob on exertation  . CHF (congestive heart failure) ([place])   . Angina   . GERD (gastroesophageal reflux disease)   . AVM (arteriovenous malformation) of colon  small intestine; stomach  . Right arm pain 01/08/2012  . Hyperlipidemia   . Blood transfusion   . Hx of cardiovascular stress test     a. Lexiscan Myoview (10/15):  Small inferolateral and apical defect c/w scar and poss soft tissue attenuation, no ischemia, EF 42%  . Iron deficiency anemia     Past Surgical History  Procedure Laterality Date  . Tubal ligation  04/01/2000  . Pacemaker insertion  [place], most recent gen change by [name] 4/12  . Cardiac catheterization    . Esophagogastroduodenoscopy  12/23/2011    Procedure: ESOPHAGOGASTRODUODENOSCOPY (EGD);  Surgeon: [name], MD;  Location: [facility];  Service: Endoscopy;  Laterality: N/A;  . Givens capsule study  12/23/2011    Procedure: GIVENS  CAPSULE STUDY;  Surgeon: [name], MD;  Location: [facility];  Service: Endoscopy;  Laterality: N/A;     Current Outpatient Prescriptions  Medication Sig Dispense Refill  . acetaminophen (TYLENOL) 325 MG tablet Take 325 mg by mouth daily as needed (pain).    [name] ALPRAZolam (XANAX) 0.5 MG tablet TAKE 1 TABLET BY MOUTH DAILY AS NEEDED FOR ANXIETY OR SLEEP 15 tablet 1  . aspirin EC 81 MG tablet Take 81 mg by mouth daily.     . Calcium-Vitamin D 600-200 MG-UNIT tablet Take 1 tablet by mouth daily. 30 tablet 0  . carvedilol (COREG) 25 MG tablet TAKE 1 TABLET BY MOUTH TWICE DAILY 60 tablet 5  . esomeprazole (NEXIUM) 40 MG capsule TAKE ONE CAPSULE BY MOUTH EVERY MORNING BEFORE BREAKFAST 90 capsule 3  . ferrous sulfate 325 (65 FE) MG tablet TAKE 1 TABLET BY MOUTH TWICE DAILY WITH MEALS TO KEEP BLOOD COUNT UP 60 tablet 11  . furosemide (LASIX) 20 MG tablet Take 1 tablet (20 mg total) by mouth daily. Except for Monday, Wednesday and Friday take 40 mg by mouth daily. (Patient taking differently: Take 20-40 mg by mouth daily. Except for Monday, Wednesday and Friday take 40 mg by mouth daily.) 135 tablet 3  . nitroGLYCERIN (NITROSTAT) 0.4 MG SL tablet Place 1 tablet (0.4 mg total) under the tongue every 5 (five) minutes as needed for chest pain. 25 tablet 1  . predniSONE (DELTASONE) 10 MG tablet TAKE 1 TABLET(10 MG) BY MOUTH DAILY WITH BREAKFAST 30 tablet 1  . rosuvastatin (CRESTOR) 20 MG tablet TAKE 1 TABLET BY MOUTH DAILY ON MONDAY, WEDNESDAY, AND FRIDAY (Patient taking differently: TAKE 1 TABLET BY MOUTH DAILY ON MONDAY, WEDNESDAY, AND FRIDAY. Patient states she only takes this occasionally because she gets cramps with it) 40 tablet 3  . senna (SENOKOT) 8.6 MG tablet Take 1 tablet by mouth daily as needed for constipation.     . sucralfate (CARAFATE) 1 G tablet TAKE 1 TABLET BY MOUTH TWICE DAILY 60 tablet 3   Current Facility-Administered Medications  Medication Dose Route Frequency Provider Last  Rate Last Dose  . cyanocobalamin ((VITAMIN B-12)) injection 1,000 mcg  1,000 mcg Intramuscular Q30 days [name], MD   1,000 mcg at 09/16/11 1341    Allergies:   Ace inhibitors    Social History:  The patient  reports that she quit smoking about 4 years ago. Her smoking use included Cigarettes. She has a 5 pack-year smoking history. She has never used smokeless tobacco. She reports that she does not drink alcohol or use illicit drugs.   Family History:  The patient's family history includes Alcohol abuse in her brother and father; Aneurysm  in her mother; CVA in her mother; Cancer in her brother; Colon cancer in her brother; Diabetes in her brother and sister; Heart attack in her brother and father; Heart disease in her mother and sister; Hypertension in her brother, father, mother, and sister; Stroke in her father and mother.    ROS:  Please see the history of present illness.   Otherwise, review of systems are positive for pruritis of the legs and shoulder pain.   All other systems are reviewed and negative.    PHYSICAL EXAM: VS:  BP 128/94 mmHg  Pulse 70  Ht 5\' 5"  (1.651 m)  Wt 81.194 kg (179 lb)  BMI 29.79 kg/m2 , BMI Body mass index is 29.79 kg/(m^2). GENERAL:  Well appearing HEENT:  Pupils equal round and reactive, fundi not visualized, oral mucosa unremarkable NECK:  No jugular venous distention, waveform within normal limits, carotid upstroke brisk and symmetric, no bruits, no thyromegaly LYMPHATICS:  No cervical adenopathy LUNGS:  Clear to auscultation bilaterally HEART:  RRR.  PMI not displaced or sustained,S1 and S2 within normal limits, no S3, no S4, no clicks, no rubs, no murmurs ABD:  Flat, positive bowel sounds normal in frequency in pitch, no bruits, no rebound, no guarding, no midline pulsatile mass, no hepatomegaly, no splenomegaly EXT:  2 plus pulses throughout, no edema, no cyanosis no clubbing SKIN:  No rashes no nodules NEURO:  Cranial nerves II through XII  grossly intact, motor grossly intact throughout PSYCH:  Cognitively intact, oriented to person place and time    EKG:  EKG is ordered today. The ekg ordered today demonstrates ventricularly paced at 70 bpm. Atrial rhythm unknown.   Echo 11/26/15: Study Conclusions  - Left ventricle: The cavity size was normal. There was severe concentric hypertrophy. Systolic function was vigorous. The estimated ejection fraction was in the range of 65% to 70%. Wall motion was normal; there were no regional wall motion abnormalities. Features are consistent with a pseudonormal left ventricular filling pattern, with concomitant abnormal relaxation and increased filling pressure (grade 2 diastolic dysfunction). - Aortic valve: Trileaflet; normal thickness, mildly calcified leaflets. - Mitral valve: There was trivial regurgitation. - Left atrium: The atrium was mildly dilated. - Atrial septum: There was increased thickness of the septum, consistent with lipomatous hypertrophy. - Tricuspid valve: There was trivial regurgitation.   Recent Labs: 12/26/2014: ALT 14 02/15/2015: Pro B Natriuretic peptide (BNP) 363.0* 04/05/2015: TSH 0.76 11/25/2015: B Natriuretic Peptide 436.4* 11/26/2015: Magnesium 2.3 11/27/2015: Hemoglobin 11.4*; Platelets 275 12/04/2015: BUN 44*; Creat 1.69*; Potassium 3.8; Sodium 140    Lipid Panel    Component Value Date/Time   CHOL 122 03/28/2015 1504   TRIG 86 03/28/2015 1504   HDL 42* 03/28/2015 1504   CHOLHDL 2.9 03/28/2015 1504   VLDL 17 03/28/2015 1504   LDLCALC 63 03/28/2015 1504   LDLDIRECT 161.8 02/21/2013 1416      Wt Readings from Last 3 Encounters:  12/14/15 81.194 kg (179 lb)  12/04/15 79.062 kg (174 lb 4.8 oz)  11/27/15 77.747 kg (171 lb 6.4 oz)      ASSESSMENT AND PLAN:  # Hypertension: Blood pressure fairly well controlled. Her diastolic blood pressure is slightly elevated.  Continue carvedilol.   # Hyperlipidemia: Continue  rosuvastatin.  # Chronic atrial fibrillation, PPM, CHB:  Managed by Dr. Rayann Heman.  Device was last checked remotely in July.  It appears to be functioning fine on EKG today.  She continues to have palpitations but is V paced at 70 bpm.  She is not on anticoagulation due to a history of GI bleed.  This patients CHA2DS2-VASc Score and unadjusted Ischemic Stroke Rate (% per year) is equal to 2.2 % stroke rate/year from a score of 2  Above score calculated as 1 point each if present [CHF, HTN, DM, Vascular=MI/PAD/Aortic Plaque, Age if 65-74, or Female] Above score calculated as 2 points each if present [Age > 75, or Stroke/TIA/TE]  # Chronic diastolic heart failure:  Ms. Gilner is euvolemic on exam today and her shortness of breath have improved.  Continue carvedilol and Lasix. Creatinine improved when checked by her PCP last week.   Current medicines are reviewed at length with the patient today.  The patient does not have concerns regarding medicines.  The following changes have been made:  no change  Labs/ tests ordered today include:   Orders Placed This Encounter  Procedures  . Ambulatory referral to Cardiology  . EKG 12-Lead     Disposition:   FU with Krue Peterka C. Oval Linsey, MD, [facility] in 6 months.    Signed, Mckoy Bhakta C. Oval Linsey, MD, [facility]  12/14/2015 4:33 PM    [place]

## 2015-12-18 ENCOUNTER — Ambulatory Visit (INDEPENDENT_AMBULATORY_CARE_PROVIDER_SITE_OTHER): Payer: Medicare Other | Admitting: Family Medicine

## 2015-12-18 VITALS — BP 104/80 | HR 72 | Temp 98.1°F

## 2015-12-18 DIAGNOSIS — R42 Dizziness and giddiness: Secondary | ICD-10-CM | POA: Diagnosis not present

## 2015-12-18 NOTE — Progress Notes (Signed)
   Subjective:    Patient ID: [name], female    DOB: [birth year], 70 y.o.   MRN: [id number]  HPI  Patient presents for Same Day Appointment  CC: multiple complaints  # Not feeling well:  Past 1-2 days  Feels lightheaded and dizzy past several weeks, worse in last day  Also having low back pain worse past few days  Headache for past 1 month but not today  No new medicines  Does say she started cutting the prednisone tablet in half starting yesterday ROS: no nausea or vomiting  Social Hx: former smoker  Review of Systems   See HPI for ROS.   Past medical history, surgical, family, and social history reviewed and updated in the EMR as appropriate.  Objective:  BP 104/80 mmHg  Pulse 72  Temp(Src) 98.1 F (36.7 C) (Oral)  SpO2 97% Vitals and nursing note reviewed  General: NAD CV: RRR, normal heart sound, no murmur appreciated Resp: clear to auscultation bilaterally, normal effort Extremities: no LE edema Neuro: alert and oriented, some confusion with recalling her medicines. Gait appears normal.  Assessment & Plan:  1. Dizziness Suspect she is feeling symptoms from dropping prednisone dose too quickly. Has been on daily prednisone since May, was on 20mg  but down to 10mg  for past several months. Recommended she take her half tab today, continue 10mg  daily until I see her next week. Had been meaning to discuss with her if we can start titrating this down (however this may be difficult, she seems to have trouble understanding her medicine use.. May need to contact son/daughter to update them on this). Follow up 1 week.

## 2015-12-18 NOTE — Patient Instructions (Signed)
Take the second half of the prednisone today.  Continue taking a FULL tablet of the PREDNISONE every day. It is very important you follow the doctor's directions for this closely. If you decrease it too quickly it will cause you severe symptoms (including light headedness, feeling bad overall).  Come back in about 1 week to see me and we will discuss tapering the prednisone medicine.

## 2015-12-24 ENCOUNTER — Ambulatory Visit: Payer: Medicare Other | Admitting: Family Medicine

## 2015-12-31 ENCOUNTER — Ambulatory Visit (INDEPENDENT_AMBULATORY_CARE_PROVIDER_SITE_OTHER): Payer: Medicare Other | Admitting: Family Medicine

## 2015-12-31 ENCOUNTER — Encounter: Payer: Self-pay | Admitting: Family Medicine

## 2015-12-31 VITALS — BP 128/80 | HR 78 | Temp 98.1°F | Ht 65.0 in | Wt 176.0 lb

## 2015-12-31 DIAGNOSIS — M353 Polymyalgia rheumatica: Secondary | ICD-10-CM

## 2015-12-31 DIAGNOSIS — R42 Dizziness and giddiness: Secondary | ICD-10-CM

## 2015-12-31 MED ORDER — PREDNISONE 1 MG PO TABS
9.0000 mg | ORAL_TABLET | Freq: Every day | ORAL | Status: DC
Start: 2015-12-31 — End: 2016-03-04

## 2015-12-31 NOTE — Patient Instructions (Addendum)
For sleep:  Okay to try the melatonin (which can be bought over the counter). Also okay to try one half of a trazodone (25mg  total) for sleep as well, which is part of the instructions on the bottle   Stop the 10mg  tablet of prednisone. Start taking 9 tablets of 1mg  prednisone daily, for the next month.

## 2016-01-01 NOTE — Assessment & Plan Note (Addendum)
Difficult situation, patient is managing her own medicines (uses pill box) however not taking prednisone as a daily medicine. I believe this is causing some of her dizziness symptoms. I discussed with her slow titration of the prednisone, and discussed which way to prescribe the prednisone would work best for her and she believed taking a specific number of the 1mg  tablets would be better than mixing the different dosages. Will start with 9mg  prednisone (9 tabs of 1mg ) and titrate down 1mg  per month as tolerated. Follow up 1 month.  If her dizziness does not improve with taking her daily prednisone may consider vestibular eval.

## 2016-01-01 NOTE — Progress Notes (Signed)
   Subjective:    Patient ID: [name], female    DOB: 11/07/1946, 70 y.o.   MRN: [id number]  HPI  CC: follow up dizziness  # Dizziness  Still getting some dizziness, hasn't changed all that much  States that she has not been consistently taking her prednisone, she has skipped days  She cannot say for sure that she is only dizzy when she doesn't take the medicine but thinks that might be the case ROS: no changes in vision, no changes in hearing  # Polymyalgia rheumatica:  shoulder pain sometimes, complains more of some neck pain  Says today that she has skipped some days of the prednisone ROS: no numbness or tingling of arms/hands.   Social Hx: former smoker  Review of Systems   See HPI for ROS.   Past medical history, surgical, family, and social history reviewed and updated in the EMR as appropriate. Objective:  BP 128/80 mmHg  Pulse 78  Temp(Src) 98.1 F (36.7 C) (Oral)  Ht 5\' 5"  (1.651 m)  Wt 176 lb (79.833 kg)  BMI 29.29 kg/m2  SpO2 98% Vitals and nursing note reviewed  General: NAD CV: RRR, normal s1s2, no murmur appreciated. 2+ radial pulses bilaterally  Resp: clear to auscultation bilaterally, normal effort  Neuro: alert and oriented. Gait is normal.  Assessment & Plan:  Polymyalgia rheumatica (HCC) Difficult situation, patient is managing her own medicines (uses pill box) however not taking prednisone as a daily medicine. I believe this is causing some of her dizziness symptoms. I discussed with her slow titration of the prednisone, and discussed which way to prescribe the prednisone would work best for her and she believed taking a specific number of the 1mg  tablets would be better than mixing the different dosages. Will start with 9mg  prednisone (9 tabs of 1mg ) and titrate down 1mg  per month as tolerated. Follow up 1 month.  If her dizziness does not improve with taking her daily prednisone may consider vestibular eval.

## 2016-01-25 ENCOUNTER — Telehealth: Payer: Self-pay | Admitting: Cardiovascular Disease

## 2016-01-25 ENCOUNTER — Encounter: Payer: Self-pay | Admitting: Cardiovascular Disease

## 2016-01-25 ENCOUNTER — Ambulatory Visit (INDEPENDENT_AMBULATORY_CARE_PROVIDER_SITE_OTHER): Payer: Medicare Other | Admitting: Cardiovascular Disease

## 2016-01-25 VITALS — BP 108/70 | HR 71 | Ht 65.0 in | Wt 177.0 lb

## 2016-01-25 DIAGNOSIS — I482 Chronic atrial fibrillation, unspecified: Secondary | ICD-10-CM

## 2016-01-25 DIAGNOSIS — I10 Essential (primary) hypertension: Secondary | ICD-10-CM | POA: Diagnosis not present

## 2016-01-25 DIAGNOSIS — R0789 Other chest pain: Secondary | ICD-10-CM | POA: Diagnosis not present

## 2016-01-25 DIAGNOSIS — R0602 Shortness of breath: Secondary | ICD-10-CM

## 2016-01-25 MED ORDER — CARVEDILOL 25 MG PO TABS: 12.5000 mg | ORAL_TABLET | Freq: Two times a day (BID) | ORAL | Status: DC

## 2016-01-25 NOTE — Telephone Encounter (Signed)
Pt of [name], Dr. Rayann Heman  Hx A Fib, PM for complete HB.  Returned pt call. She elicited a variety of complaints, including dizziness, neck pain, shoulder pain ongoing w/ chronicity. Pt also states she was SOB last night and has been having heart palpitations on and off for about 3 days.  I did review chart- she was seen by family medicine for general complaints including the dizziness and pain problems in mid-January. Pt acknowledged this. Pt aware the pain likely to be MSK & we may be unable to adequately address this complaint in particular. Inquiring about the palpitations, pt did not have BP or HR readings at home  - based on lack of available data, I did not give recommendations for med changes. [name] had opening for this AM - pt added to schedule at [facility].

## 2016-01-25 NOTE — Progress Notes (Signed)
Cardiology Office Note   Date:  01/25/2016   ID:  Quanique, Krakowski 1946/01/30, MRN [id number]  PCP:  Tawanna Sat, MD  Cardiologist:   Sharol Harness, MD  Electrophysiologist: Thompson Grayer, MD   Chief Complaint  Patient presents with  . Dizziness    some chest pain, shortness of breath, swelling around neck,     Patient ID: [name] is a 70 y.o. female with hypertension, hyperlipidemia, CKD III, chronic atrial fibrillation, chronic diastolic heart failure, complete heart block status post pacemaker ([employer]), hypertrophic cardiomyopathy, and COPD who presents for follow up.    Interval History 01/25/16:  Ms. Goodin presents for a same day appointment with multiple complaints.  She reports intermittent heart fluttering that is not associated with chest pain or shortness of breath. She also has soreness in her left shoulder and swelling across her left neck and shoulder. Additionally, she reports lightheadedness but typically occurs when she changes positions or after walking.She denies syncope. Last Wednesday she awoke diaphoretic with numbness in both hands. For she thought she was having a stroke However she denied any focal denied dysarthria. Eventually the numbness in her hands went away.Last night she had a difficult time sleeping because of the pain in her neck.  Ms. Scarsella reports swelling in her knees but not in her lower legs or ankles. She denies orthopnea or PND. She weighs herself regularly and her weight has remained between 176 and 180 pounds. She denies any recent dietary indiscretion.  History of Present Illness 12/14/15:  Ms. Mensinger is a patient of Dr. Rayann Heman.  She last saw Dr. Rayann Heman on 7/25, at which time she was doing well and was referred to general cardiology to establish care.  She has chronic atrial fibrillation and is ventricularly paced for complete heart block.  She was admitted to the hospital from 12/11-12/13.   She presented with chest pain.  He was noted to be mildly volume overloaded on exam and BNP was elevated to 436. Chest x-ray was concerning for heart failure.  Echo showed normal systolic function and grade 2 diastolic dysfunction. There was no intracavitary gradient.  She was diuresed with IV Lasix.  Her hospitalization was complicated by acute renal failure and was discharged with a creatinine of 2.3. She followed up with her primary care provider on 12/2 and her creatinine was 1.69.  Troponin was mildly elevated to 0.19. She underwent Lexiscan stress testing that was negative for ischemia.  Ms. Mahdavi is not on anticoagulation due to prior GI bleed.   She has not been feeling well since discharge from the hospital. She continues to have palpitations but no longer has chest pain.  This occurs daily 3-4 times per day.  The episodes last for a few minutes at a time and get better with taking a deep breath or rubbing her chest.  The symptoms are not exertional and typically occur at rest. She denies , lightheadedness, or dizziness. She does, however, note lightheadedness when bending over and standing up too quickly. She denies lower extremity edema, orthopnea or PND.   Past Medical History  Diagnosis Date  . Angioedema     2/2 ACE  . RLS (restless legs syndrome)     Dx 06/2007  . Anemia   . Arteriovenous malformation of stomach   . Permanent atrial fibrillation ([place])   . Renal failure     baseline creatinine 1.6  . Hyperlipidemia   . Depression   . Diastolic heart failure   .  Arthritis   . Sleep apnea   . Asthma   . Hx of colonic polyp 08/13/10    adenomatous  . Fatty liver 07/26/10  . Panic attacks   . Hypertension   . Complete heart block ([place])     s/p PPM 1998  . Hypertrophic cardiomyopathy ([place])     dx by Dr [name] 2009  . Hx of colonoscopy   . GI bleed   . Panic attack   . DDD (degenerative disc disease)   . History of alcohol abuse Stopped Fall 2012  . History of  tobacco use Quit Fall 2012  . Shortness of breath     sob on exertation  . CHF (congestive heart failure) ([place])   . Angina   . GERD (gastroesophageal reflux disease)   . AVM (arteriovenous malformation) of colon     small intestine; stomach  . Right arm pain 01/08/2012  . Hyperlipidemia   . Blood transfusion   . Hx of cardiovascular stress test     a. Lexiscan Myoview (10/15):  Small inferolateral and apical defect c/w scar and poss soft tissue attenuation, no ischemia, EF 42%  . Iron deficiency anemia     Past Surgical History  Procedure Laterality Date  . Tubal ligation  04/01/2000  . Pacemaker insertion  [place], most recent gen change by [name] 4/12  . Cardiac catheterization    . Esophagogastroduodenoscopy  12/23/2011    Procedure: ESOPHAGOGASTRODUODENOSCOPY (EGD);  Surgeon: [name], MD;  Location: [facility];  Service: Endoscopy;  Laterality: N/A;  . Givens capsule study  12/23/2011    Procedure: GIVENS CAPSULE STUDY;  Surgeon: [name], MD;  Location: [facility];  Service: Endoscopy;  Laterality: N/A;     Current Outpatient Prescriptions  Medication Sig Dispense Refill  . acetaminophen (TYLENOL) 325 MG tablet Take 325 mg by mouth daily as needed (pain).    [name] ALPRAZolam (XANAX) 0.5 MG tablet TAKE 1 TABLET BY MOUTH DAILY AS NEEDED FOR ANXIETY OR SLEEP 15 tablet 1  . aspirin EC 81 MG tablet Take 81 mg by mouth daily.     . Calcium-Vitamin D 600-200 MG-UNIT tablet Take 1 tablet by mouth daily. 30 tablet 0  . carvedilol (COREG) 25 MG tablet Take 0.5 tablets (12.5 mg total) by mouth 2 (two) times daily. 60 tablet 5  . esomeprazole (NEXIUM) 40 MG capsule TAKE ONE CAPSULE BY MOUTH EVERY MORNING BEFORE BREAKFAST 90 capsule 3  . ferrous sulfate 325 (65 FE) MG tablet TAKE 1 TABLET BY MOUTH TWICE DAILY WITH MEALS TO KEEP BLOOD COUNT UP 60 tablet 11  . furosemide (LASIX) 20 MG tablet Take 1 tablet (20 mg total) by mouth daily. Except for Monday, Wednesday and Friday take  40 mg by mouth daily. (Patient taking differently: Take 20-40 mg by mouth daily. Except for Monday, Wednesday and Friday take 40 mg by mouth daily.) 135 tablet 3  . nitroGLYCERIN (NITROSTAT) 0.4 MG SL tablet Place 1 tablet (0.4 mg total) under the tongue every 5 (five) minutes as needed for chest pain. 25 tablet 1  . predniSONE (DELTASONE) 1 MG tablet Take 9 tablets (9 mg total) by mouth daily with breakfast. 270 tablet 0  . rosuvastatin (CRESTOR) 20 MG tablet TAKE 1 TABLET BY MOUTH DAILY ON MONDAY, WEDNESDAY, AND FRIDAY (Patient taking differently: TAKE 1 TABLET BY MOUTH DAILY ON MONDAY, [place], AND FRIDAY. Patient states she only takes this occasionally because she gets cramps with it) 40  tablet 3  . senna (SENOKOT) 8.6 MG tablet Take 1 tablet by mouth daily as needed for constipation.     . sucralfate (CARAFATE) 1 G tablet TAKE 1 TABLET BY MOUTH TWICE DAILY 60 tablet 3   Current Facility-Administered Medications  Medication Dose Route Frequency Provider Last Rate Last Dose  . cyanocobalamin ((VITAMIN B-12)) injection 1,000 mcg  1,000 mcg Intramuscular Q30 days [name], MD   1,000 mcg at 09/16/11 1341    Allergies:   Ace inhibitors    Social History:  The patient  reports that she quit smoking about 4 years ago. Her smoking use included Cigarettes. She has a 5 pack-year smoking history. She has never used smokeless tobacco. She reports that she does not drink alcohol or use illicit drugs.   Family History:  The patient's family history includes Alcohol abuse in her brother and father; Aneurysm in her mother; CVA in her mother; Cancer in her brother; Colon cancer in her brother; Diabetes in her brother and sister; Heart attack in her brother and father; Heart disease in her mother and sister; Hypertension in her brother, father, mother, and sister; Stroke in her father and mother.    ROS:  Please see the history of present illness.   Otherwise, review of systems are positive for  pruritis of the legs and shoulder pain.   All other systems are reviewed and negative.    PHYSICAL EXAM: VS:  BP 108/70 mmHg  Pulse 71  Ht 5\' 5"  (1.651 m)  Wt 80.287 kg (177 lb)  BMI 29.45 kg/m2 , BMI Body mass index is 29.45 kg/(m^2). GENERAL:  Well appearing HEENT:  Pupils equal round and reactive, fundi not visualized, oral mucosa unremarkable NECK:  No jugular venous distention, waveform within normal limits, carotid upstroke brisk and symmetric, no bruits.  L neck tender to palpation. LYMPHATICS:  No cervical adenopathy LUNGS:  Clear to auscultation bilaterally HEART:  RRR.  PMI not displaced or sustained,S1 and S2 within normal limits, no S3, no S4, no clicks, no rubs, no murmurs CHEST: L chest wall tender to palpation ABD:  Flat, positive bowel sounds normal in frequency in pitch, no bruits, no rebound, no guarding, no midline pulsatile mass, no hepatomegaly, no splenomegaly EXT:  2 plus pulses throughout, no edema, no cyanosis no clubbing SKIN:  No rashes no nodules NEURO:  Cranial nerves II through XII grossly intact, motor grossly intact throughout PSYCH:  Cognitively intact, oriented to person place and time   EKG:  EKG is ordered today. The ekg ordered today demonstrates ventricularly paced at 70 bpm. Likely atrial fibrillation.   Echo 11/26/15: Study Conclusions  - Left ventricle: The cavity size was normal. There was severe concentric hypertrophy. Systolic function was vigorous. The estimated ejection fraction was in the range of 65% to 70%. Wall motion was normal; there were no regional wall motion abnormalities. Features are consistent with a pseudonormal left ventricular filling pattern, with concomitant abnormal relaxation and increased filling pressure (grade 2 diastolic dysfunction). - Aortic valve: Trileaflet; normal thickness, mildly calcified leaflets. - Mitral valve: There was trivial regurgitation. - Left atrium: The atrium was mildly  dilated. - Atrial septum: There was increased thickness of the septum, consistent with lipomatous hypertrophy. - Tricuspid valve: There was trivial regurgitation.  ICD Interrogation 01/25/16:  [employer] single chamber pacemaker Battery 2.78 V Presenting rhythm is atrial fibrillation Intermittently paced at 70 bpm. Underlying rhythm atrial fibrillation with complete heart block Threshold: 1V @ 0.95ms Sensing: not performed  due to complete heart block. Impedance: 436 Ohms  No events noted   Recent Labs: 02/15/2015: Pro B Natriuretic peptide (BNP) 363.0* 04/05/2015: TSH 0.76 11/25/2015: B Natriuretic Peptide 436.4* 11/26/2015: Magnesium 2.3 11/27/2015: Hemoglobin 11.4*; Platelets 275 12/04/2015: BUN 44*; Creat 1.69*; Potassium 3.8; Sodium 140    Lipid Panel    Component Value Date/Time   CHOL 122 03/28/2015 1504   TRIG 86 03/28/2015 1504   HDL 42* 03/28/2015 1504   CHOLHDL 2.9 03/28/2015 1504   VLDL 17 03/28/2015 1504   LDLCALC 63 03/28/2015 1504   LDLDIRECT 161.8 02/21/2013 1416      Wt Readings from Last 3 Encounters:  01/25/16 80.287 kg (177 lb)  12/31/15 79.833 kg (176 lb)  12/14/15 81.194 kg (179 lb)      ASSESSMENT AND PLAN:  # Hypertension: Blood pressure well-controlled.  However, her BP is low today and she reports lightheadedness.  We will reduce her carvedilol to 12.5 mg bid.  # Hyperlipidemia: Continue rosuvastatin.  # Chronic atrial fibrillation, PPM, CHB:  Managed by Dr. Rayann Heman. She remains in atrial fibrillaiton today.  She reports heart fluttering. No arrhythmias were noted on device interrogation.  Device was last checked remotely in July.  It appears to be functioning fine on EKG today.  She continues to have palpitations but is V paced at 70 bpm.  She is not on anticoagulation due to a history of GI bleed.  This patients CHA2DS2-VASc Score and unadjusted Ischemic Stroke Rate (% per year) is equal to 2.2 % stroke rate/year from a score of 2  Above  score calculated as 1 point each if present [CHF, HTN, DM, Vascular=MI/PAD/Aortic Plaque, Age if 65-74, or Female] Above score calculated as 2 points each if present [Age > 75, or Stroke/TIA/TE]  # Chronic diastolic heart failure:  Ms. Chavarria is euvolemic on exam today and her shortness of breath have improved. Check BNP.  # Chest/neck pain: Ms. Achterberg-Polk's neck pain seems to be musculoskeletal in nature.  She had a stress test in December that was non-diagnostic for ischemia. I recommended that she start taking Tylenol 650 mg 4 times daily as needed for pain.   Current medicines are reviewed at length with the patient today.  The patient does not have concerns regarding medicines.  The following changes have been made:  Reduce carvedilol to 12.5 mg bid  Labs/ tests ordered today include:   Orders Placed This Encounter  Procedures  . EKG 12-Lead     Disposition:   FU with Maleny Candy C. Oval Linsey, MD, [facility] in 1 month.    Signed, Makarios Madlock C. Oval Linsey, MD, [facility]  01/25/2016 2:18 PM    [place]

## 2016-01-25 NOTE — Patient Instructions (Signed)
DECREASE CARVEDILOL FROM 25 MG TO 12.5 MG  (1/2 TABLET ) TWICE A DAY.  MAY TAKE TYLENOL(GENERIC) 650 MG EVERY 4 HOURS  A DAY AS NEEDED FOR PAIN.  Your physician recommends that you schedule a follow-up appointment in [place].  If you need a refill on your cardiac medications before your next appointment, please call your pharmacy.

## 2016-01-25 NOTE — Telephone Encounter (Signed)
New message  Pt c/o swelling: STAT is pt has developed SOB within 24 hours  1. How long have you been experiencing swelling? Off and on   2. Where is the swelling located? Neck   3.  Are you currently taking a "fluid pill"? Yes   4.  Are you currently SOB? Yes at times when she has the heart flutters   5.  Have you traveled recently? NO    Comments: Pt states that a while back she was given something for Inflammation. Wednesday night she woke up in a sweat and her shoulders were hurting she could hardly move her head and it just seemed sore. She placed alcohol on it with a heating pad. Pt has also massaged a cream lidocaine on her shoulder and neck. She has had light dizziness.   Patient c/o Palpitations:  High priority if patient c/o lightheadedness and shortness of breath.  1. How long have you been having palpitations? 72 Hours it has gotten worse. But she has had them for a while   2. Are you currently experiencing lightheadedness and shortness of breath? Not as much as she was yesterday and last night. Up all night she couldn't sleep   3. Have you checked your BP and heart rate? (document readings) No   4. Are you experiencing any other symptoms? Lightheaded and dizzy spells. She has to sit down.

## 2016-01-31 ENCOUNTER — Ambulatory Visit (INDEPENDENT_AMBULATORY_CARE_PROVIDER_SITE_OTHER): Payer: Medicare Other | Admitting: Family Medicine

## 2016-01-31 ENCOUNTER — Encounter: Payer: Self-pay | Admitting: Family Medicine

## 2016-01-31 VITALS — BP 108/73 | HR 75 | Temp 97.8°F | Wt 173.4 lb

## 2016-01-31 DIAGNOSIS — M353 Polymyalgia rheumatica: Secondary | ICD-10-CM

## 2016-01-31 DIAGNOSIS — G4700 Insomnia, unspecified: Secondary | ICD-10-CM | POA: Diagnosis not present

## 2016-01-31 DIAGNOSIS — J441 Chronic obstructive pulmonary disease with (acute) exacerbation: Secondary | ICD-10-CM | POA: Diagnosis not present

## 2016-01-31 MED ORDER — ALBUTEROL SULFATE HFA 108 (90 BASE) MCG/ACT IN AERS: 2.0000 | INHALATION_SPRAY | Freq: Four times a day (QID) | RESPIRATORY_TRACT | Status: DC | PRN

## 2016-01-31 MED ORDER — DOXYCYCLINE HYCLATE 100 MG PO TABS: 100.0000 mg | ORAL_TABLET | Freq: Two times a day (BID) | ORAL | Status: DC

## 2016-01-31 NOTE — Patient Instructions (Signed)
Take 5 of the 1mg  prednisone tablets daily.  Take the doxycycline 100mg  twice daily.   Use the albuterol 2-4 puffs every 4 hours as needed  If you get worse come back sooner, otherwise I'd like to see you again in 1 week

## 2016-01-31 NOTE — Progress Notes (Signed)
   Subjective:    Patient ID: [name], female    DOB: 1946/01/13, 70 y.o.   MRN: [id number]  HPI  CC: cough  # Cough:  Started 1 week ago  Wheezing  Productive of yellow phlegm  Feels light headed and some shortness of breath at times  Sleeping more often  Hasn't been using any inhalers recently. ROS: no fevers, no current SOB  # PMR  Feels the prednisone had been giving her a lot of issues with heart fluttering  Hasn't taken any of the prednisone in about 1 week ROS: denies dizziness  # Insomnia  Has not tried the medicines recommended  Also says this has improved so she didn't feel the need to try medications   Social Hx: former smoker  Review of Systems   See HPI for ROS.   Past medical history, surgical, family, and social history reviewed and updated in the EMR as appropriate. Objective:  BP 108/73 mmHg  Pulse 75  Temp(Src) 97.8 F (36.6 C) (Oral)  Wt 173 lb 6.4 oz (78.654 kg)  SpO2 90% Vitals and nursing note reviewed  General: no apparent distress  CV: normal rate, regular rhythm  Resp: coarse breath sounds with rhonchi and wheezes bilaterally, she has normal effort  Assessment & Plan:  Polymyalgia rheumatica ([place]) She is still having troubles with following directions for prednisone despite my stressing this; however she apparently has not had any issues with adrenal crisis with completely stopping her prednisone for past week. Originally discussed slow titrating of 1mg  per month (starting at 9mg  last month), however we will do a trial of 5mg  and downtitrate from there. Follow up 1 week.  1. COPD exacerbation ([place]) In setting of uri/bronchitis. Discussed usual treatment with increased prednisone however she seems adverse to this already on the lower doses of prednisone. Will treat with doxycycline and asked her to follow up next week.  2. Insomnia Apparently improved without medications. Follow up as needed.

## 2016-02-02 NOTE — Assessment & Plan Note (Signed)
She is still having troubles with following directions for prednisone despite my stressing this; however she apparently has not had any issues with adrenal crisis with completely stopping her prednisone for past week. Originally discussed slow titrating of 1mg  per month (starting at 9mg  last month), however we will do a trial of 5mg  and downtitrate from there. Follow up 1 week.

## 2016-02-04 ENCOUNTER — Emergency Department (HOSPITAL_COMMUNITY): Payer: Medicare Other

## 2016-02-04 ENCOUNTER — Encounter (HOSPITAL_COMMUNITY): Payer: Self-pay | Admitting: Emergency Medicine

## 2016-02-04 DIAGNOSIS — Z792 Long term (current) use of antibiotics: Secondary | ICD-10-CM | POA: Diagnosis not present

## 2016-02-04 DIAGNOSIS — M1990 Unspecified osteoarthritis, unspecified site: Secondary | ICD-10-CM | POA: Diagnosis not present

## 2016-02-04 DIAGNOSIS — K219 Gastro-esophageal reflux disease without esophagitis: Secondary | ICD-10-CM | POA: Insufficient documentation

## 2016-02-04 DIAGNOSIS — K760 Fatty (change of) liver, not elsewhere classified: Secondary | ICD-10-CM | POA: Diagnosis not present

## 2016-02-04 DIAGNOSIS — I422 Other hypertrophic cardiomyopathy: Secondary | ICD-10-CM | POA: Diagnosis not present

## 2016-02-04 DIAGNOSIS — G4730 Sleep apnea, unspecified: Secondary | ICD-10-CM | POA: Insufficient documentation

## 2016-02-04 DIAGNOSIS — F329 Major depressive disorder, single episode, unspecified: Secondary | ICD-10-CM | POA: Diagnosis not present

## 2016-02-04 DIAGNOSIS — Z86010 Personal history of colonic polyps: Secondary | ICD-10-CM | POA: Insufficient documentation

## 2016-02-04 DIAGNOSIS — R0789 Other chest pain: Secondary | ICD-10-CM | POA: Diagnosis not present

## 2016-02-04 DIAGNOSIS — Z7952 Long term (current) use of systemic steroids: Secondary | ICD-10-CM | POA: Diagnosis not present

## 2016-02-04 DIAGNOSIS — J45901 Unspecified asthma with (acute) exacerbation: Secondary | ICD-10-CM | POA: Insufficient documentation

## 2016-02-04 DIAGNOSIS — Q2733 Arteriovenous malformation of digestive system vessel: Secondary | ICD-10-CM | POA: Insufficient documentation

## 2016-02-04 DIAGNOSIS — F410 Panic disorder [episodic paroxysmal anxiety] without agoraphobia: Secondary | ICD-10-CM | POA: Diagnosis not present

## 2016-02-04 DIAGNOSIS — E785 Hyperlipidemia, unspecified: Secondary | ICD-10-CM | POA: Diagnosis not present

## 2016-02-04 DIAGNOSIS — I10 Essential (primary) hypertension: Secondary | ICD-10-CM | POA: Diagnosis not present

## 2016-02-04 DIAGNOSIS — D649 Anemia, unspecified: Secondary | ICD-10-CM | POA: Insufficient documentation

## 2016-02-04 DIAGNOSIS — G2581 Restless legs syndrome: Secondary | ICD-10-CM | POA: Insufficient documentation

## 2016-02-04 DIAGNOSIS — J441 Chronic obstructive pulmonary disease with (acute) exacerbation: Principal | ICD-10-CM | POA: Insufficient documentation

## 2016-02-04 DIAGNOSIS — I5030 Unspecified diastolic (congestive) heart failure: Secondary | ICD-10-CM | POA: Insufficient documentation

## 2016-02-04 DIAGNOSIS — I209 Angina pectoris, unspecified: Secondary | ICD-10-CM | POA: Insufficient documentation

## 2016-02-04 DIAGNOSIS — I442 Atrioventricular block, complete: Secondary | ICD-10-CM | POA: Diagnosis not present

## 2016-02-04 DIAGNOSIS — Z87891 Personal history of nicotine dependence: Secondary | ICD-10-CM | POA: Diagnosis not present

## 2016-02-04 DIAGNOSIS — Z79899 Other long term (current) drug therapy: Secondary | ICD-10-CM | POA: Diagnosis not present

## 2016-02-04 DIAGNOSIS — R05 Cough: Secondary | ICD-10-CM | POA: Diagnosis not present

## 2016-02-04 DIAGNOSIS — Z7982 Long term (current) use of aspirin: Secondary | ICD-10-CM | POA: Insufficient documentation

## 2016-02-04 DIAGNOSIS — N19 Unspecified kidney failure: Secondary | ICD-10-CM | POA: Insufficient documentation

## 2016-02-04 MED ORDER — ALBUTEROL SULFATE (2.5 MG/3ML) 0.083% IN NEBU
5.0000 mg | INHALATION_SOLUTION | Freq: Once | RESPIRATORY_TRACT | Status: AC
  Administered 2016-02-04: 5 mg via RESPIRATORY_TRACT

## 2016-02-04 MED ORDER — ALBUTEROL SULFATE (2.5 MG/3ML) 0.083% IN NEBU
INHALATION_SOLUTION | RESPIRATORY_TRACT | Status: AC
  Filled 2016-02-04: qty 6

## 2016-02-04 NOTE — ED Notes (Signed)
Pt from home for eval of continued dry cough and wheezing, pt states saw pcp and was told if she got worse come to Ed. Pt states she feels mucus in her lungs but can't cough it up. Pt in nad at [facility], expiratory wheezing noted.

## 2016-02-05 ENCOUNTER — Observation Stay (HOSPITAL_COMMUNITY)
Admission: EM | Admit: 2016-02-05 | Discharge: 2016-02-06 | Disposition: A | Discharge status: Home / Self Care | Payer: Medicare Other | Attending: Family Medicine | Admitting: Family Medicine

## 2016-02-05 DIAGNOSIS — R002 Palpitations: Secondary | ICD-10-CM | POA: Diagnosis not present

## 2016-02-05 DIAGNOSIS — J441 Chronic obstructive pulmonary disease with (acute) exacerbation: Principal | ICD-10-CM

## 2016-02-05 DIAGNOSIS — R079 Chest pain, unspecified: Secondary | ICD-10-CM | POA: Diagnosis present

## 2016-02-05 DIAGNOSIS — R0789 Other chest pain: Secondary | ICD-10-CM | POA: Diagnosis not present

## 2016-02-05 LAB — I-STAT TROPONIN, ED
TROPONIN I, POC: 0.28 ng/mL — AB (ref 0.00–0.08)
Troponin i, poc: 0.31 ng/mL (ref 0.00–0.08)
Troponin i, poc: 0.29 ng/mL (ref 0.00–0.08)

## 2016-02-05 LAB — CBC WITH DIFFERENTIAL/PLATELET
BASOS ABS: 0 10*3/uL (ref 0.0–0.1)
Basophils Relative: 1 %
EOS PCT: 3 %
Eosinophils Absolute: 0.1 10*3/uL (ref 0.0–0.7)
HCT: 31.8 % — ABNORMAL LOW (ref 36.0–46.0)
Hemoglobin: 10 g/dL — ABNORMAL LOW (ref 12.0–15.0)
LYMPHS PCT: 41 %
Lymphs Abs: 1.6 10*3/uL (ref 0.7–4.0)
MCH: 28 pg (ref 26.0–34.0)
MCHC: 31.4 g/dL (ref 30.0–36.0)
MCV: 89.1 fL (ref 78.0–100.0)
Monocytes Absolute: 0.4 10*3/uL (ref 0.1–1.0)
Monocytes Relative: 12 %
Neutro Abs: 1.7 10*3/uL (ref 1.7–7.7)
Neutrophils Relative %: 43 %
PLATELETS: 205 10*3/uL (ref 150–400)
RBC: 3.57 MIL/uL — AB (ref 3.87–5.11)
RDW: 14.2 % (ref 11.5–15.5)
WBC: 3.8 10*3/uL — AB (ref 4.0–10.5)

## 2016-02-05 LAB — COMPREHENSIVE METABOLIC PANEL
ALT: 12 U/L — AB (ref 14–54)
AST: 23 U/L (ref 15–41)
Albumin: 3.5 g/dL (ref 3.5–5.0)
Alkaline Phosphatase: 38 U/L (ref 38–126)
Anion gap: 12 (ref 5–15)
BUN: 28 mg/dL — AB (ref 6–20)
CHLORIDE: 103 mmol/L (ref 101–111)
CO2: 25 mmol/L (ref 22–32)
CREATININE: 1.46 mg/dL — AB (ref 0.44–1.00)
Calcium: 10.1 mg/dL (ref 8.9–10.3)
GFR calc Af Amer: 40 mL/min — ABNORMAL LOW (ref >60)
GFR calc non Af Amer: 35 mL/min — ABNORMAL LOW (ref >60)
GLUCOSE: 107 mg/dL — AB (ref 65–99)
Potassium: 4.1 mmol/L (ref 3.5–5.1)
SODIUM: 140 mmol/L (ref 135–145)
Total Bilirubin: 0.5 mg/dL (ref 0.3–1.2)
Total Protein: 7.1 g/dL (ref 6.5–8.1)

## 2016-02-05 LAB — TROPONIN I
TROPONIN I: 0.38 ng/mL — AB (ref <0.031)
TROPONIN I: 0.41 ng/mL — AB (ref <0.031)
TROPONIN I: 0.38 ng/mL — AB (ref <0.031)

## 2016-02-05 LAB — BRAIN NATRIURETIC PEPTIDE: B Natriuretic Peptide: 144 pg/mL — ABNORMAL HIGH (ref 0.0–100.0)

## 2016-02-05 LAB — MAGNESIUM: Magnesium: 1.8 mg/dL (ref 1.7–2.4)

## 2016-02-05 MED ORDER — ALPRAZOLAM 0.5 MG PO TABS: 0.5000 mg | ORAL_TABLET | Freq: Every evening | ORAL | Status: DC | PRN

## 2016-02-05 MED ORDER — MORPHINE SULFATE (PF) 2 MG/ML IV SOLN: 2.0000 mg | INTRAVENOUS | Status: DC | PRN

## 2016-02-05 MED ORDER — PREDNISONE 5 MG PO TABS
9.0000 mg | ORAL_TABLET | Freq: Every day | ORAL | Status: DC
  Filled 2016-02-05: qty 4

## 2016-02-05 MED ORDER — CARVEDILOL 12.5 MG PO TABS
12.5000 mg | ORAL_TABLET | Freq: Two times a day (BID) | ORAL | Status: DC
  Administered 2016-02-05 – 2016-02-06 (×2): 12.5 mg via ORAL
  Filled 2016-02-05 (×2): qty 1

## 2016-02-05 MED ORDER — SENNOSIDES 8.6 MG PO TABS: 1.0000 | ORAL_TABLET | Freq: Every day | ORAL | Status: DC | PRN

## 2016-02-05 MED ORDER — ALBUTEROL SULFATE HFA 108 (90 BASE) MCG/ACT IN AERS: 2.0000 | INHALATION_SPRAY | Freq: Four times a day (QID) | RESPIRATORY_TRACT | Status: DC | PRN

## 2016-02-05 MED ORDER — ACETAMINOPHEN 325 MG PO TABS: 325.0000 mg | ORAL_TABLET | Freq: Every day | ORAL | Status: DC | PRN

## 2016-02-05 MED ORDER — FUROSEMIDE 20 MG PO TABS: 20.0000 mg | ORAL_TABLET | Freq: Every day | ORAL | Status: DC

## 2016-02-05 MED ORDER — ASPIRIN EC 81 MG PO TBEC
81.0000 mg | DELAYED_RELEASE_TABLET | Freq: Every day | ORAL | Status: DC
  Administered 2016-02-06: 81 mg via ORAL
  Filled 2016-02-05: qty 1

## 2016-02-05 MED ORDER — FUROSEMIDE 20 MG PO TABS: 20.0000 mg | ORAL_TABLET | ORAL | Status: DC

## 2016-02-05 MED ORDER — GI COCKTAIL ~~LOC~~: 30.0000 mL | Freq: Four times a day (QID) | ORAL | Status: DC | PRN

## 2016-02-05 MED ORDER — SUCRALFATE 1 G PO TABS
1.0000 g | ORAL_TABLET | Freq: Two times a day (BID) | ORAL | Status: DC
  Administered 2016-02-05 – 2016-02-06 (×2): 1 g via ORAL
  Filled 2016-02-05 (×2): qty 1

## 2016-02-05 MED ORDER — SENNA 8.6 MG PO TABS: 1.0000 | ORAL_TABLET | Freq: Every day | ORAL | Status: DC | PRN

## 2016-02-05 MED ORDER — NITROGLYCERIN 0.4 MG SL SUBL: 0.4000 mg | SUBLINGUAL_TABLET | SUBLINGUAL | Status: DC | PRN

## 2016-02-05 MED ORDER — ALBUTEROL SULFATE (2.5 MG/3ML) 0.083% IN NEBU
2.5000 mg | INHALATION_SOLUTION | Freq: Four times a day (QID) | RESPIRATORY_TRACT | Status: DC | PRN
  Administered 2016-02-06: 2.5 mg via RESPIRATORY_TRACT
  Filled 2016-02-05: qty 3

## 2016-02-05 MED ORDER — FUROSEMIDE 40 MG PO TABS
40.0000 mg | ORAL_TABLET | ORAL | Status: DC
  Administered 2016-02-06: 40 mg via ORAL
  Filled 2016-02-05: qty 1

## 2016-02-05 MED ORDER — ONDANSETRON HCL 4 MG/2ML IJ SOLN: 4.0000 mg | Freq: Four times a day (QID) | INTRAMUSCULAR | Status: DC | PRN

## 2016-02-05 MED ORDER — PANTOPRAZOLE SODIUM 40 MG PO TBEC
40.0000 mg | DELAYED_RELEASE_TABLET | Freq: Every day | ORAL | Status: DC
  Administered 2016-02-06: 40 mg via ORAL
  Filled 2016-02-05: qty 1

## 2016-02-05 NOTE — ED Provider Notes (Signed)
CSN: [id number]     Arrival date & time 02/04/16  2202 History   First MD Initiated Contact with Patient 02/05/16 0701     Chief Complaint  Patient presents with  . Cough  . Wheezing     (Consider location/radiation/quality/duration/timing/severity/associated sxs/prior Treatment) HPI Comments: Pt reports really here for concern for panic attack Doxycycline, prednisone for wheezing from dr Kristeen Miss like having side effects/palpitations from prednisone, stopped once got to 5mg    Patient is a 70 y.o. female presenting with cough and wheezing.  Cough Cough characteristics:  Productive Sputum characteristics:  Yellow Severity:  Severe Duration:  1 week Associated symptoms: chest pain (initially reports soreness then reports heart flutters), shortness of breath and wheezing (2 days)   Associated symptoms: no fever, no headaches, no rash and no sore throat   Wheezing Associated symptoms: chest pain (initially reports soreness then reports heart flutters), cough and shortness of breath   Associated symptoms: no fever, no headaches, no rash and no sore throat     Past Medical History  Diagnosis Date  . Angioedema     2/2 ACE  . RLS (restless legs syndrome)     Dx 06/2007  . Anemia   . Arteriovenous malformation of stomach   . Permanent atrial fibrillation ([place])   . Renal failure     baseline creatinine 1.6  . Hyperlipidemia   . Depression   . Diastolic heart failure   . Arthritis   . Sleep apnea   . Asthma   . Hx of colonic polyp 08/13/10    adenomatous  . Fatty liver 07/26/10  . Panic attacks   . Hypertension   . Complete heart block ([place])     s/p PPM 1998  . Hypertrophic cardiomyopathy ([place])     dx by Dr [name] 2009  . Hx of colonoscopy   . GI bleed   . Panic attack   . DDD (degenerative disc disease)   . History of alcohol abuse Stopped Fall 2012  . History of tobacco use Quit Fall 2012  . Shortness of breath     sob on exertation  . CHF (congestive heart failure)  ([place])   . Angina   . GERD (gastroesophageal reflux disease)   . AVM (arteriovenous malformation) of colon     small intestine; stomach  . Right arm pain 01/08/2012  . Hyperlipidemia   . Blood transfusion   . Hx of cardiovascular stress test     a. Lexiscan Myoview (10/15):  Small inferolateral and apical defect c/w scar and poss soft tissue attenuation, no ischemia, EF 42%  . Iron deficiency anemia    Past Surgical History  Procedure Laterality Date  . Tubal ligation  04/01/2000  . Pacemaker insertion  [place], most recent gen change by [name] 4/12  . Cardiac catheterization    . Esophagogastroduodenoscopy  12/23/2011    Procedure: ESOPHAGOGASTRODUODENOSCOPY (EGD);  Surgeon: [name], MD;  Location: [facility];  Service: Endoscopy;  Laterality: N/A;  . Givens capsule study  12/23/2011    Procedure: GIVENS CAPSULE STUDY;  Surgeon: [name], MD;  Location: [facility];  Service: Endoscopy;  Laterality: N/A;   Family History  Problem Relation Age of Onset  . Hypertension Mother   . Stroke Mother   . Hypertension Father   . Stroke Father   . Hypertension Sister   . Hypertension Brother   . Colon cancer Brother   . Diabetes Sister   .  Diabetes Brother   . Cancer Brother   . Heart attack Brother   . Heart attack Father   . Heart disease Mother   . Heart disease Sister   . Alcohol abuse Brother   . Alcohol abuse Father   . Aneurysm Mother   . CVA Mother    Social History  Substance Use Topics  . Smoking status: Former Smoker -- 0.10 packs/day for 50 years    Types: Cigarettes    Quit date: 08/16/2011  . Smokeless tobacco: Never Used     Comment: quit 2012  . Alcohol Use: No     Comment: quit 08/16/2011   OB History    No data available     Review of Systems  Constitutional: Negative for fever.  HENT: Negative for sore throat.   Eyes: Negative for visual disturbance.  Respiratory: Positive for cough, shortness of breath and wheezing (2 days).    Cardiovascular: Positive for chest pain (initially reports soreness then reports heart flutters).  Gastrointestinal: Negative for nausea, vomiting and abdominal pain.  Genitourinary: Negative for difficulty urinating.  Musculoskeletal: Negative for back pain and neck pain.  Skin: Negative for rash.  Neurological: Negative for syncope and headaches.      Allergies  Ace inhibitors  Home Medications   Prior to Admission medications   Medication Sig Start Date End Date Taking? Authorizing Provider  acetaminophen (TYLENOL) 325 MG tablet Take 325 mg by mouth daily as needed (pain).   Yes Historical Provider, MD  albuterol (PROVENTIL HFA;VENTOLIN HFA) 108 (90 Base) MCG/ACT inhaler Inhale 2 puffs into the lungs every 6 (six) hours as needed for wheezing or shortness of breath. 01/31/16  Yes Leone Brand, MD  ALPRAZolam Duanne Moron) 0.5 MG tablet TAKE 1 TABLET BY MOUTH DAILY AS NEEDED FOR ANXIETY OR SLEEP 12/11/15  Yes Leone Brand, MD  aspirin EC 81 MG tablet Take 81 mg by mouth daily.    Yes Historical Provider, MD  Calcium-Vitamin D 600-200 MG-UNIT tablet Take 1 tablet by mouth daily. 11/27/15  Yes Nicolette Bang, DO  carvedilol (COREG) 25 MG tablet Take 0.5 tablets (12.5 mg total) by mouth 2 (two) times daily. 01/25/16  Yes Skeet Latch, MD  doxycycline (VIBRA-TABS) 100 MG tablet Take 1 tablet (100 mg total) by mouth 2 (two) times daily. 01/31/16  Yes Leone Brand, MD  esomeprazole (NEXIUM) 40 MG capsule TAKE ONE CAPSULE BY MOUTH EVERY MORNING BEFORE BREAKFAST 03/22/15  Yes Leone Brand, MD  ferrous sulfate 325 (65 FE) MG tablet TAKE 1 TABLET BY MOUTH TWICE DAILY WITH MEALS TO KEEP BLOOD COUNT UP 08/03/14  Yes Leone Brand, MD  furosemide (LASIX) 20 MG tablet Take 1 tablet (20 mg total) by mouth daily. Except for Monday, Wednesday and Friday take 40 mg by mouth daily. Patient taking differently: Take 20-40 mg by mouth daily. Except for Monday, Wednesday and Friday take 40 mg by  mouth daily. 02/16/15  Yes Liliane Shi, PA-C  nitroGLYCERIN (NITROSTAT) 0.4 MG SL tablet Place 1 tablet (0.4 mg total) under the tongue every 5 (five) minutes as needed for chest pain. 01/18/15  Yes Eileen Stanford, PA-C  predniSONE (DELTASONE) 1 MG tablet Take 9 tablets (9 mg total) by mouth daily with breakfast. 12/31/15  Yes Leone Brand, MD  rosuvastatin (CRESTOR) 20 MG tablet TAKE 1 TABLET BY MOUTH DAILY ON MONDAY, [place], AND FRIDAY Patient taking differently: TAKE 1 TABLET BY MOUTH DAILY ON MONDAY, [place], [place]. Patient  states she only takes this occasionally because she gets cramps with it 05/21/15  Yes Olam Idler, MD  senna (SENOKOT) 8.6 MG tablet Take 1 tablet by mouth daily as needed for constipation.    Yes Historical Provider, MD  sucralfate (CARAFATE) 1 G tablet TAKE 1 TABLET BY MOUTH TWICE DAILY 08/28/15  Yes Leone Brand, MD   BP 125/77 mmHg  Pulse 70  Temp(Src) 98.4 F (36.9 C) (Oral)  Resp 17  Ht 5\' 4"  ([id number] m)  Wt 173 lb 4.8 oz (78.608 kg)  BMI 29.73 kg/m2  SpO2 96% Physical Exam  Constitutional: She is oriented to person, place, and time. She appears well-developed and well-nourished. No distress.  HENT:  Head: Normocephalic and atraumatic.  Eyes: Conjunctivae and EOM are normal.  Neck: Normal range of motion.  Cardiovascular: Normal rate, regular rhythm, normal heart sounds and intact distal pulses.  Exam reveals no gallop and no friction rub.   No murmur heard. Pulmonary/Chest: Effort normal. No respiratory distress. She has wheezes. She has no rales.  Abdominal: Soft. She exhibits no distension. There is no tenderness. There is no guarding.  Musculoskeletal: She exhibits no edema or tenderness.  Neurological: She is alert and oriented to person, place, and time.  Skin: Skin is warm and dry. No rash noted. She is not diaphoretic. No erythema.  Nursing note and vitals reviewed.   ED Course  Procedures (including critical care time) Labs  Review Labs Reviewed  CBC WITH DIFFERENTIAL/PLATELET - Abnormal; Notable for the following:    WBC 3.8 (*)    RBC 3.57 (*)    Hemoglobin 10.0 (*)    HCT 31.8 (*)    All other components within normal limits  COMPREHENSIVE METABOLIC PANEL - Abnormal; Notable for the following:    Glucose, Bld 107 (*)    BUN 28 (*)    Creatinine, Ser 1.46 (*)    ALT 12 (*)    GFR calc non Af Amer 35 (*)    GFR calc Af Amer 40 (*)    All other components within normal limits  BRAIN NATRIURETIC PEPTIDE - Abnormal; Notable for the following:    B Natriuretic Peptide 144.0 (*)    All other components within normal limits  TROPONIN I - Abnormal; Notable for the following:    Troponin I 0.38 (*)    All other components within normal limits  TROPONIN I - Abnormal; Notable for the following:    Troponin I 0.41 (*)    All other components within normal limits  I-STAT TROPOININ, ED - Abnormal; Notable for the following:    Troponin i, poc 0.31 (*)    All other components within normal limits  I-STAT TROPOININ, ED - Abnormal; Notable for the following:    Troponin i, poc 0.28 (*)    All other components within normal limits  I-STAT TROPOININ, ED - Abnormal; Notable for the following:    Troponin i, poc 0.29 (*)    All other components within normal limits  MAGNESIUM  TROPONIN I    Imaging Review Dg Chest 2 View  02/04/2016  CLINICAL DATA:  Dry cough and wheezing. EXAM: CHEST  2 VIEW COMPARISON:  11/25/2015 FINDINGS: Right-sided pacemaker remains in place. Cardiomegaly again seen, unchanged from prior. Improved pulmonary edema. No confluent airspace disease. No pleural effusion or pneumothorax. Osseous structures are intact. IMPRESSION: Improved pulmonary edema.  Unchanged cardiomegaly Electronically Signed   By: Jeb Levering M.D.   On: 02/04/2016 23:06  I have personally reviewed and evaluated these images and lab results as part of my medical decision-making.   EKG Interpretation None       MDM   Final diagnoses:  Chest discomfort  COPD exacerbation ([place])   70 year old female with a history of CHF, hypertrophic cardiomyopathy, atrial fibrillation, complete heart block, pacemaker placement 1998, hypertension, hyperlipidemia, panic attacks, presents with concern for cough and wheezing for days and "panic attack" last night. Chest x-ray shows no signs of pneumonia, and shows improved pulmonary edema.  Patient without hypoxia, improved XR from prior. Describes episode of "not feeling right", anxiety, palpitations shortness of breath last night which was relieved by xanax. Patient also reports chest discomfort with episode.  EKG shows a ventricular paced rhythm. Troponin was .31, pt with elevated troponin in past .15. Interrogated pacemaker.    Regarding wheezing, pt feels improved after albuterol treatment.  She is currently completing doxycycline rx and prednisone.  Given symptoms of chest discomfort, with risk factors, troponin more elevated than previous, will admit for continued observation, monitoring and troponin evaluation.  Gareth Morgan, MD 02/05/16 2008

## 2016-02-05 NOTE — Plan of Care (Signed)
Problem: Consults Goal: Chest Pain Patient Education (See Patient Education module for education specifics.) Outcome: Progressing Patient admitted to [facility] from ED after presenting with chest pain. Troponin I values have been positive, around 0.30 and holding relatively flat. BNP mildly elevated and CXR shows resolving CHF since last evaluation.  At this time, patient is symptom free, mainly concerned with receiving a dinner tray. This concern was addressed at the time of assessment.  Vital signs on arrival to floor were stable: Filed Vitals:    02/05/16 1245 02/05/16 1624 02/05/16 1715 02/05/16 1815  BP: 140/86 125/86 128/87 125/77  Pulse: 70 70 69 70  Temp:       98.4 F (36.9 C)  TempSrc:       Oral  Resp: 16 22   17   Height:       5\' 4"  (1.626 m)  Weight:       78.608 kg (173 lb 4.8 oz)  SpO2: 99% 99% 97% 96%    Provided education to patient and family at bedside re:  Plan of care (chest pain, r/o MI)  Tests/labs/procedures  Serial troponin I  Serial EKG  2-D echocardiogram (possible, to be determined by MD)  Cardiac catheterization vs. stress testing (possible, to be determined by MD)  Unit routines/pain rating scale  Need to call RN for any symptoms  Safety plan  Initial discharge plan  No problems reported at this time. Continuing to monitor.

## 2016-02-05 NOTE — H&P (Signed)
[facility] Admission History and Physical Service Pager: [phone]  Patient name: [name] Medical record number: [id number] Date of birth: Aug 24, 1946 Age: 70 y.o. Gender: female  Primary Care Provider: Tawanna Sat, MD Consultants: none  Code Status: full   Chief Complaint: chest pain  Assessment and Plan: Yan Dotts is a 70 y.o. female presenting with chest pain. PMH is significant for HTN, HLD, complete heart block s/p pacemaker, and atrial fibrillation.   #Chest pain/ACS rule out: troponin mildly elevated above baseline (0.15) with a ventricular paced rhythm on EKG. She suffers from anxiety attacks and took a valium last night with minimal improvement in her pain and may also be contributing to her pain/"flutters."  Myoview performed in December 2016 showing no ischemia and no indication for cath. She was seen by her Cardiologist on 01/25/16 that reported she had no events on her pacemakers.  - Place in observation with telemetry, attending Dr. Ardelia Mems - Repeat EKG in AM - Cycle troponins - consider Consult cardiology in AM - Cont ASA 81 mg - Nitro PRN  #HFpEF: most recent ECHO showing EF 65-70% with severe concentric hypertrophy and grade 2 DD. Appears euvolemic on exam with no crackles and no shortness of breath - continue coreg.   #Complete heart block s/p Permanent pacemaker (St. Jude): recent interrogation showed no arrhythmias on 2/10 - monitor on telemetry  #COPD: Mild wheezing heard today on exam. No shortness of breath or hypoxemia. Quit tobacco use roughly 3-4 years ago. She was recently seen on February 16 and started on doxycycline. - she should have completed her course of doxycycline.  - taking prednisone 9 mg, may need to increase this if she will allow it.  - continue albuterol PRN  - consider outpatient PFT's   #Chronic A.fib: No anticoagulation with a history of AVMs and GI bleeding. Has seen Dr. Rayann Heman in  the past and a possible candidate for Watchman device - Continue carvedilol  #HLD/HTN: BP 140/mid 90s in ED.  - Continue Lasix - Continue Crestor   #Polymyalgia rheumatica: She reports intermittent use of her prednisone and has recently been tapering. Most likely her complaints of musculoskeletal pain (shoulder and neck) are associated with this versus arthritic changes.  - unclear if we need to increase or start a new taper of her prednisone. Continuing 9 mg daily .   - Consider a.m. cortisol  #CKDIII: Cr today seems to be around her baseline. She has had some proteinuria in the past. - Consider outpatient nephrology consult  #Normocytic anemia: Upon chart review is noted that she has a history of iron deficiency anemia. May be associated with bleeding from her AVM has ferritin has been normal in the past.  - consider outpatient work up.   FEN/GI: heart healthy, KVO Prophylaxis: SCD's   Disposition: pending cycle of troponin's   History of Present Illness:  [name] is a 69 y.o. female presenting with chest pain. She reports that her chest pain centralized in nature and feels as if it's a soreness. It does radiate to her left breast and mid axilla region. She is having some associated nausea but no vomiting. She reports some diaphoresis but unclear if this is associated with the chest pain. She has a history of heart "fluttering"and reported this when she was admitted in December 2016. She has an inconsistent history of what medications that she supposed to be taking it when she is supposed to be taking them. She has a history of polymyalgia  rheumatica and reports being tapered down on prednisone but has not taken any prednisone since a week ago. Her doctor is wanting to start tapering them down she had stopped them abruptly prior to that.  Review Of Systems: Per HPI with the following additions: See HPI  Otherwise the remainder of the systems were negative.  Patient Active  Problem List   Diagnosis Date Noted  . Chest pain 11/25/2015  . Dyspnea on exertion   . Polymyalgia rheumatica ([place]) 04/10/2015  . Low back pain 03/28/2015  . Hypertrophic cardiomyopathy ([place]) 02/15/2015  . Eczema 09/18/2014  . Environmental allergies 09/18/2014  . Pre-diabetes 11/26/2013  . COPD, moderate ([place]) 11/01/2013  . Preventative health care 08/18/2012  . Complete heart block ([place]) 04/03/2011  . Anemia 02/17/2011  . AVM (arteriovenous malformation) 02/13/2011  . PACEMAKER-St.Jude 11/28/2010  . CHRONIC KIDNEY DISEASE STAGE III (MODERATE) 09/24/2010  . (HFpEF) heart failure with preserved ejection fraction ([place]) 08/30/2010  . ARTHRITIS 07/24/2010  . ANXIETY 07/23/2010  . Chronic atrial fibrillation ([place]) 07/16/2010  . HLD (hyperlipidemia) 02/11/2007  . Hypertension 02/11/2007  . APNEA, SLEEP 02/11/2007    Past Medical History: Past Medical History  Diagnosis Date  . Angioedema     2/2 ACE  . RLS (restless legs syndrome)     Dx 06/2007  . Anemia   . Arteriovenous malformation of stomach   . Permanent atrial fibrillation ([place])   . Renal failure     baseline creatinine 1.6  . Hyperlipidemia   . Depression   . Diastolic heart failure   . Arthritis   . Sleep apnea   . Asthma   . Hx of colonic polyp 08/13/10    adenomatous  . Fatty liver 07/26/10  . Panic attacks   . Hypertension   . Complete heart block ([place])     s/p PPM 1998  . Hypertrophic cardiomyopathy ([place])     dx by Dr [name] 2009  . Hx of colonoscopy   . GI bleed   . Panic attack   . DDD (degenerative disc disease)   . History of alcohol abuse Stopped Fall 2012  . History of tobacco use Quit Fall 2012  . Shortness of breath     sob on exertation  . CHF (congestive heart failure) ([place])   . Angina   . GERD (gastroesophageal reflux disease)   . AVM (arteriovenous malformation) of colon     small intestine; stomach  . Right arm pain 01/08/2012  . Hyperlipidemia   . Blood transfusion   . Hx of  cardiovascular stress test     a. Lexiscan Myoview (10/15):  Small inferolateral and apical defect c/w scar and poss soft tissue attenuation, no ischemia, EF 42%  . Iron deficiency anemia     Past Surgical History: Past Surgical History  Procedure Laterality Date  . Tubal ligation  04/01/2000  . Pacemaker insertion  [place], most recent gen change by [name] 4/12  . Cardiac catheterization    . Esophagogastroduodenoscopy  12/23/2011    Procedure: ESOPHAGOGASTRODUODENOSCOPY (EGD);  Surgeon: [name], MD;  Location: [facility];  Service: Endoscopy;  Laterality: N/A;  . Givens capsule study  12/23/2011    Procedure: GIVENS CAPSULE STUDY;  Surgeon: [name], MD;  Location: [facility];  Service: Endoscopy;  Laterality: N/A;    Social History: Social History  Substance Use Topics  . Smoking status: Former Smoker -- 0.10 packs/day for 50 years    Types: Cigarettes  Quit date: 08/16/2011  . Smokeless tobacco: Never Used     Comment: quit 2012  . Alcohol Use: No     Comment: quit 08/16/2011   Additional social history: Former smoker  Please also refer to relevant sections of EMR.  Family History: Family History  Problem Relation Age of Onset  . Hypertension Mother   . Stroke Mother   . Hypertension Father   . Stroke Father   . Hypertension Sister   . Hypertension Brother   . Colon cancer Brother   . Diabetes Sister   . Diabetes Brother   . Cancer Brother   . Heart attack Brother   . Heart attack Father   . Heart disease Mother   . Heart disease Sister   . Alcohol abuse Brother   . Alcohol abuse Father   . Aneurysm Mother   . CVA Mother     Allergies and Medications: Allergies  Allergen Reactions  . Ace Inhibitors Other (See Comments)    REACTION: angioedema   Current Facility-Administered Medications on File Prior to Encounter  Medication Dose Route Frequency Provider Last Rate Last Dose  . cyanocobalamin ((VITAMIN B-12)) injection 1,000 mcg  1,000  mcg Intramuscular Q30 days [name], MD   1,000 mcg at 09/16/11 1341   Current Outpatient Prescriptions on File Prior to Encounter  Medication Sig Dispense Refill  . acetaminophen (TYLENOL) 325 MG tablet Take 325 mg by mouth daily as needed (pain).    [name] albuterol (PROVENTIL HFA;VENTOLIN HFA) 108 (90 Base) MCG/ACT inhaler Inhale 2 puffs into the lungs every 6 (six) hours as needed for wheezing or shortness of breath. 1 Inhaler 2  . ALPRAZolam (XANAX) 0.5 MG tablet TAKE 1 TABLET BY MOUTH DAILY AS NEEDED FOR ANXIETY OR SLEEP 15 tablet 1  . aspirin EC 81 MG tablet Take 81 mg by mouth daily.     . Calcium-Vitamin D 600-200 MG-UNIT tablet Take 1 tablet by mouth daily. 30 tablet 0  . carvedilol (COREG) 25 MG tablet Take 0.5 tablets (12.5 mg total) by mouth 2 (two) times daily. 60 tablet 5  . doxycycline (VIBRA-TABS) 100 MG tablet Take 1 tablet (100 mg total) by mouth 2 (two) times daily. 10 tablet 0  . esomeprazole (NEXIUM) 40 MG capsule TAKE ONE CAPSULE BY MOUTH EVERY MORNING BEFORE BREAKFAST 90 capsule 3  . ferrous sulfate 325 (65 FE) MG tablet TAKE 1 TABLET BY MOUTH TWICE DAILY WITH MEALS TO KEEP BLOOD COUNT UP 60 tablet 11  . furosemide (LASIX) 20 MG tablet Take 1 tablet (20 mg total) by mouth daily. Except for Monday, Wednesday and Friday take 40 mg by mouth daily. (Patient taking differently: Take 20-40 mg by mouth daily. Except for Monday, Wednesday and Friday take 40 mg by mouth daily.) 135 tablet 3  . nitroGLYCERIN (NITROSTAT) 0.4 MG SL tablet Place 1 tablet (0.4 mg total) under the tongue every 5 (five) minutes as needed for chest pain. 25 tablet 1  . predniSONE (DELTASONE) 1 MG tablet Take 9 tablets (9 mg total) by mouth daily with breakfast. 270 tablet 0  . rosuvastatin (CRESTOR) 20 MG tablet TAKE 1 TABLET BY MOUTH DAILY ON MONDAY, WEDNESDAY, AND FRIDAY (Patient taking differently: TAKE 1 TABLET BY MOUTH DAILY ON MONDAY, [place], AND FRIDAY. Patient states she only takes this occasionally  because she gets cramps with it) 40 tablet 3  . senna (SENOKOT) 8.6 MG tablet Take 1 tablet by mouth daily as needed for constipation.     [name]  sucralfate (CARAFATE) 1 G tablet TAKE 1 TABLET BY MOUTH TWICE DAILY 60 tablet 3    Objective: BP 141/90 mmHg  Pulse 70  Temp(Src) 98 F (36.7 C) (Oral)  Resp 27  Ht 5\' 5"  (1.651 m)  Wt 177 lb 4 oz (80.4 kg)  BMI 29.50 kg/m2  SpO2 97% Exam: General: NAD, elderly female  Eyes: EOMI ENTM: MMM, no Cervical LAD  Neck: supple  Cardiovascular: regular rate, S1S2 Chest: some tenderness to palpation over sternum.  Respiratory: Clear to auscultation bilaterally, normal effort, no crackles, mild and inconsistent expiratory wheezing Abdomen: Soft, nontender nondistended, positive bowel sounds, no hepatosplenomegaly MSK: Moves all extremities freely Skin: no rashes  Neuro: No gross deficits Psych: Alert and oriented  Labs and Imaging: CBC BMET   Recent Labs Lab 02/05/16 0749  WBC 3.8*  HGB 10.0*  HCT 31.8*  PLT 205    Recent Labs Lab 02/05/16 0749  NA 140  K 4.1  CL 103  CO2 25  BUN 28*  CREATININE 1.46*  GLUCOSE 107*  CALCIUM 10.1     Dg Chest 2 View  02/04/2016  CLINICAL DATA:  Dry cough and wheezing. EXAM: CHEST  2 VIEW COMPARISON:  11/25/2015 FINDINGS: Right-sided pacemaker remains in place. Cardiomegaly again seen, unchanged from prior. Improved pulmonary edema. No confluent airspace disease. No pleural effusion or pneumothorax. Osseous structures are intact. IMPRESSION: Improved pulmonary edema.  Unchanged cardiomegaly Electronically Signed   By: Jeb Levering M.D.   On: 02/04/2016 23:06   EKG: ventricular paced   Rosemarie Ax, MD 02/05/2016, 8:44 AM PGY-3, [facility] pager: [phone], text pages welcome

## 2016-02-06 ENCOUNTER — Encounter (HOSPITAL_COMMUNITY): Payer: Self-pay | Admitting: General Practice

## 2016-02-06 DIAGNOSIS — R0789 Other chest pain: Secondary | ICD-10-CM | POA: Diagnosis not present

## 2016-02-06 DIAGNOSIS — J441 Chronic obstructive pulmonary disease with (acute) exacerbation: Secondary | ICD-10-CM | POA: Diagnosis not present

## 2016-02-06 DIAGNOSIS — R002 Palpitations: Secondary | ICD-10-CM | POA: Diagnosis not present

## 2016-02-06 LAB — CBC
HCT: 31.1 % — ABNORMAL LOW (ref 36.0–46.0)
Hemoglobin: 9.7 g/dL — ABNORMAL LOW (ref 12.0–15.0)
MCH: 27.9 pg (ref 26.0–34.0)
MCHC: 31.2 g/dL (ref 30.0–36.0)
MCV: 89.4 fL (ref 78.0–100.0)
PLATELETS: 201 10*3/uL (ref 150–400)
RBC: 3.48 MIL/uL — AB (ref 3.87–5.11)
RDW: 14.2 % (ref 11.5–15.5)
WBC: 3.9 10*3/uL — AB (ref 4.0–10.5)

## 2016-02-06 LAB — COMPREHENSIVE METABOLIC PANEL
ALT: 11 U/L — ABNORMAL LOW (ref 14–54)
AST: 20 U/L (ref 15–41)
Albumin: 3 g/dL — ABNORMAL LOW (ref 3.5–5.0)
Alkaline Phosphatase: 33 U/L — ABNORMAL LOW (ref 38–126)
Anion gap: 11 (ref 5–15)
BUN: 19 mg/dL (ref 6–20)
CHLORIDE: 105 mmol/L (ref 101–111)
CO2: 27 mmol/L (ref 22–32)
Calcium: 9.8 mg/dL (ref 8.9–10.3)
Creatinine, Ser: 1.38 mg/dL — ABNORMAL HIGH (ref 0.44–1.00)
GFR, EST AFRICAN AMERICAN: 43 mL/min — AB (ref >60)
GFR, EST NON AFRICAN AMERICAN: 37 mL/min — AB (ref >60)
Glucose, Bld: 114 mg/dL — ABNORMAL HIGH (ref 65–99)
POTASSIUM: 4.1 mmol/L (ref 3.5–5.1)
SODIUM: 143 mmol/L (ref 135–145)
TOTAL PROTEIN: 6.1 g/dL — AB (ref 6.5–8.1)
Total Bilirubin: 0.2 mg/dL — ABNORMAL LOW (ref 0.3–1.2)

## 2016-02-06 LAB — TROPONIN I
TROPONIN I: 0.37 ng/mL — AB (ref <0.031)
Troponin I: 0.39 ng/mL — ABNORMAL HIGH (ref <0.031)

## 2016-02-06 MED ORDER — PREDNISONE 20 MG PO TABS: 20.0000 mg | ORAL_TABLET | Freq: Every day | ORAL | Status: DC

## 2016-02-06 MED ORDER — PREDNISONE 1 MG PO TABS: 9.0000 mg | ORAL_TABLET | Freq: Every day | ORAL | Status: DC

## 2016-02-06 MED ORDER — PREDNISONE 20 MG PO TABS
20.0000 mg | ORAL_TABLET | Freq: Every day | ORAL | Status: DC
  Administered 2016-02-06: 20 mg via ORAL
  Filled 2016-02-06: qty 1

## 2016-02-06 NOTE — Discharge Instructions (Signed)
You were admitted for chest pain and was found to have a COPD exacerbation. Your chest pain was investigated with cardiac blood levels and other measures which were negative. You will need to take prednisone for 4 more days with your last day being on 02/10/2016.You will need to stop your home prednisone during this time however on February 27 you will need to restart your prednisone and take as directed by Dr. Lamar Benes.

## 2016-02-06 NOTE — Consult Note (Signed)
CONSULTATION NOTE  Reason for Consult: Chest pain, elevated troponin  Requesting Physician: Dr. Ardelia Mems  Cardiologist: [name]  HPI: This is a 70 y.o. female with a past medical history significant for hypertension, dyslipidemia, CK D3, chronic A. fib, chronic diastolic heart failure and a history of complete heart block status post [employer] permanent pacemaker. She also has hypertrophic cardiomyopathy and COPD. She is followed by [name] and Dr. Rayann Heman for her pacemaker. She was last seen in the office by [name] on 01/25/2016 as a same-day appointment with multiple complaints, including left shoulder soreness, neck pain positional lightheadedness and diaphoresis and numbness in her hands. She was recently hospitalized for chest pain and dyspnea. Troponin was noted to be mildly elevated at 0.19, but flat. She had a Lexiscan nuclear stress test which demonstrated a small perfusion defect in apical anterior wall which was unchanged from the previous study in October 2015. No definite ischemia was noted. She presents now with a "funny feeling" in the chest. Troponins again are elevated between 0.31 and 0.41. EKG shows a paced rhythm. Cardiology is asked to assess and manage her ongoing troponin elevation and chest pain.  PMHx:  Past Medical History  Diagnosis Date  . Angioedema     2/2 ACE  . RLS (restless legs syndrome)     Dx 06/2007  . Anemia   . Arteriovenous malformation of stomach   . Permanent atrial fibrillation ([place])   . Renal failure     baseline creatinine 1.6  . Hyperlipidemia   . Depression   . Diastolic heart failure   . Arthritis   . Sleep apnea   . Asthma   . Hx of colonic polyp 08/13/10    adenomatous  . Fatty liver 07/26/10  . Panic attacks   . Hypertension   . Complete heart block ([place])     s/p PPM 1998  . Hypertrophic cardiomyopathy ([place])     dx by Dr [name] 2009  . Hx of colonoscopy   . GI bleed   . Panic attack   . DDD (degenerative  disc disease)   . History of alcohol abuse Stopped Fall 2012  . History of tobacco use Quit Fall 2012  . Shortness of breath     sob on exertation  . CHF (congestive heart failure) ([place])   . Angina   . GERD (gastroesophageal reflux disease)   . AVM (arteriovenous malformation) of colon     small intestine; stomach  . Right arm pain 01/08/2012  . Hyperlipidemia   . Blood transfusion   . Hx of cardiovascular stress test     a. Lexiscan Myoview (10/15):  Small inferolateral and apical defect c/w scar and poss soft tissue attenuation, no ischemia, EF 42%  . Iron deficiency anemia    Past Surgical History  Procedure Laterality Date  . Tubal ligation  04/01/2000  . Pacemaker insertion  [place], most recent gen change by [name] 4/12  . Cardiac catheterization    . Esophagogastroduodenoscopy  12/23/2011    Procedure: ESOPHAGOGASTRODUODENOSCOPY (EGD);  Surgeon: [name], MD;  Location: [facility];  Service: Endoscopy;  Laterality: N/A;  . Givens capsule study  12/23/2011    Procedure: GIVENS CAPSULE STUDY;  Surgeon: [name], MD;  Location: [facility];  Service: Endoscopy;  Laterality: N/A;    FAMHx: Family History  Problem Relation Age of Onset  . Hypertension Mother   . Stroke Mother   .  Hypertension Father   . Stroke Father   . Hypertension Sister   . Hypertension Brother   . Colon cancer Brother   . Diabetes Sister   . Diabetes Brother   . Cancer Brother   . Heart attack Brother   . Heart attack Father   . Heart disease Mother   . Heart disease Sister   . Alcohol abuse Brother   . Alcohol abuse Father   . Aneurysm Mother   . CVA Mother     SOCHx:  reports that she quit smoking about 4 years ago. Her smoking use included Cigarettes. She has a 5 pack-year smoking history. She has never used smokeless tobacco. She reports that she does not drink alcohol or use illicit drugs.  ALLERGIES: Allergies  Allergen Reactions  . Ace Inhibitors Other (See Comments)     REACTION: angioedema    ROS: Pertinent items noted in HPI and remainder of comprehensive ROS otherwise negative.  HOME MEDICATIONS:   Medication List    ASK your doctor about these medications        acetaminophen 325 MG tablet  Commonly known as:  TYLENOL  Take 325 mg by mouth daily as needed (pain).     albuterol 108 (90 Base) MCG/ACT inhaler  Commonly known as:  PROVENTIL HFA;VENTOLIN HFA  Inhale 2 puffs into the lungs every 6 (six) hours as needed for wheezing or shortness of breath.     ALPRAZolam 0.5 MG tablet  Commonly known as:  XANAX  TAKE 1 TABLET BY MOUTH DAILY AS NEEDED FOR ANXIETY OR SLEEP     aspirin EC 81 MG tablet  Take 81 mg by mouth daily.     Calcium-Vitamin D 600-200 MG-UNIT tablet  Take 1 tablet by mouth daily.     carvedilol 25 MG tablet  Commonly known as:  COREG  Take 0.5 tablets (12.5 mg total) by mouth 2 (two) times daily.     doxycycline 100 MG tablet  Commonly known as:  VIBRA-TABS  Take 1 tablet (100 mg total) by mouth 2 (two) times daily.     esomeprazole 40 MG capsule  Commonly known as:  NEXIUM  TAKE ONE CAPSULE BY MOUTH EVERY MORNING BEFORE BREAKFAST     ferrous sulfate 325 (65 FE) MG tablet  TAKE 1 TABLET BY MOUTH TWICE DAILY WITH MEALS TO KEEP BLOOD COUNT UP     furosemide 20 MG tablet  Commonly known as:  LASIX  Take 1 tablet (20 mg total) by mouth daily. Except for Monday, Wednesday and Friday take 40 mg by mouth daily.     nitroGLYCERIN 0.4 MG SL tablet  Commonly known as:  NITROSTAT  Place 1 tablet (0.4 mg total) under the tongue every 5 (five) minutes as needed for chest pain.     predniSONE 1 MG tablet  Commonly known as:  DELTASONE  Take 9 tablets (9 mg total) by mouth daily with breakfast.     rosuvastatin 20 MG tablet  Commonly known as:  CRESTOR  TAKE 1 TABLET BY MOUTH DAILY ON MONDAY, WEDNESDAY, AND FRIDAY     senna 8.6 MG tablet  Commonly known as:  SENOKOT  Take 1 tablet by mouth daily as needed for  constipation.     sucralfate 1 g tablet  Commonly known as:  CARAFATE  TAKE 1 TABLET BY MOUTH TWICE DAILY        HOSPITAL MEDICATIONS: Prior to Admission:  Facility-administered medications prior to admission  Medication Dose Route Frequency  Provider Last Rate Last Dose  . cyanocobalamin ((VITAMIN B-12)) injection 1,000 mcg  1,000 mcg Intramuscular Q30 days [name], MD   1,000 mcg at 09/16/11 1341   Prescriptions prior to admission  Medication Sig Dispense Refill Last Dose  . acetaminophen (TYLENOL) 325 MG tablet Take 325 mg by mouth daily as needed (pain).   Past Week at Unknown time  . albuterol (PROVENTIL HFA;VENTOLIN HFA) 108 (90 Base) MCG/ACT inhaler Inhale 2 puffs into the lungs every 6 (six) hours as needed for wheezing or shortness of breath. 1 Inhaler 2 02/04/2016 at Unknown time  . ALPRAZolam (XANAX) 0.5 MG tablet TAKE 1 TABLET BY MOUTH DAILY AS NEEDED FOR ANXIETY OR SLEEP 15 tablet 1 02/04/2016 at Unknown time  . aspirin EC 81 MG tablet Take 81 mg by mouth daily.    02/04/2016 at Unknown time  . Calcium-Vitamin D 600-200 MG-UNIT tablet Take 1 tablet by mouth daily. 30 tablet 0 02/04/2016 at Unknown time  . carvedilol (COREG) 25 MG tablet Take 0.5 tablets (12.5 mg total) by mouth 2 (two) times daily. 60 tablet 5 02/04/2016 at 1800  . doxycycline (VIBRA-TABS) 100 MG tablet Take 1 tablet (100 mg total) by mouth 2 (two) times daily. 10 tablet 0 02/04/2016 at Unknown time  . esomeprazole (NEXIUM) 40 MG capsule TAKE ONE CAPSULE BY MOUTH EVERY MORNING BEFORE BREAKFAST 90 capsule 3 02/04/2016 at Unknown time  . ferrous sulfate 325 (65 FE) MG tablet TAKE 1 TABLET BY MOUTH TWICE DAILY WITH MEALS TO KEEP BLOOD COUNT UP 60 tablet 11 02/04/2016 at Unknown time  . furosemide (LASIX) 20 MG tablet Take 1 tablet (20 mg total) by mouth daily. Except for Monday, Wednesday and Friday take 40 mg by mouth daily. (Patient taking differently: Take 20-40 mg by mouth daily. Except for Monday, Wednesday and  Friday take 40 mg by mouth daily.) 135 tablet 3 02/04/2016 at Unknown time  . nitroGLYCERIN (NITROSTAT) 0.4 MG SL tablet Place 1 tablet (0.4 mg total) under the tongue every 5 (five) minutes as needed for chest pain. 25 tablet 1 unknown  . predniSONE (DELTASONE) 1 MG tablet Take 9 tablets (9 mg total) by mouth daily with breakfast. 270 tablet 0 Past Week at Unknown time  . rosuvastatin (CRESTOR) 20 MG tablet TAKE 1 TABLET BY MOUTH DAILY ON MONDAY, WEDNESDAY, AND FRIDAY (Patient taking differently: TAKE 1 TABLET BY MOUTH DAILY ON MONDAY, WEDNESDAY, AND FRIDAY. Patient states she only takes this occasionally because she gets cramps with it) 40 tablet 3 02/04/2016 at Unknown time  . senna (SENOKOT) 8.6 MG tablet Take 1 tablet by mouth daily as needed for constipation.    Past Month at Unknown time  . sucralfate (CARAFATE) 1 G tablet TAKE 1 TABLET BY MOUTH TWICE DAILY 60 tablet 3 02/04/2016 at Unknown time    VITALS: Blood pressure 117/73, pulse 70, temperature 98.1 F (36.7 C), temperature source Oral, resp. rate 18, height '5\' 4"'  (1.626 m), weight 172 lb 6.4 oz (78.2 kg), SpO2 91 %.  PHYSICAL EXAM: General appearance: alert, no distress and moderately obese Neck: no carotid bruit and no JVD Lungs: clear to auscultation bilaterally Heart: regular rate and rhythm, S1, S2 normal, no murmur, click, rub or gallop Abdomen: soft, non-tender; bowel sounds normal; no masses,  no organomegaly Extremities: extremities normal, atraumatic, no cyanosis or edema Pulses: 2+ and symmetric Skin: Skin color, texture, turgor normal. No rashes or lesions Neurologic: Grossly normal Psych: Pleasant  LABS: Results for orders placed or  performed during the hospital encounter of 02/05/16 (from the past 48 hour(s))  CBC with Differential     Status: Abnormal   Collection Time: 02/05/16  7:49 AM  Result Value Ref Range   WBC 3.8 (L) 4.0 - 10.5 K/uL   RBC 3.57 (L) 3.87 - 5.11 MIL/uL   Hemoglobin 10.0 (L) 12.0 - 15.0  g/dL   HCT 31.8 (L) 36.0 - 46.0 %   MCV 89.1 78.0 - 100.0 fL   MCH 28.0 26.0 - 34.0 pg   MCHC 31.4 30.0 - 36.0 g/dL   RDW 14.2 11.5 - 15.5 %   Platelets 205 150 - 400 K/uL   Neutrophils Relative % 43 %   Neutro Abs 1.7 1.7 - 7.7 K/uL   Lymphocytes Relative 41 %   Lymphs Abs 1.6 0.7 - 4.0 K/uL   Monocytes Relative 12 %   Monocytes Absolute 0.4 0.1 - 1.0 K/uL   Eosinophils Relative 3 %   Eosinophils Absolute 0.1 0.0 - 0.7 K/uL   Basophils Relative 1 %   Basophils Absolute 0.0 0.0 - 0.1 K/uL  Comprehensive metabolic panel     Status: Abnormal   Collection Time: 02/05/16  7:49 AM  Result Value Ref Range   Sodium 140 135 - 145 mmol/L   Potassium 4.1 3.5 - 5.1 mmol/L   Chloride 103 101 - 111 mmol/L   CO2 25 22 - 32 mmol/L   Glucose, Bld 107 (H) 65 - 99 mg/dL   BUN 28 (H) 6 - 20 mg/dL   Creatinine, Ser 1.46 (H) 0.44 - 1.00 mg/dL   Calcium 10.1 8.9 - 10.3 mg/dL   Total Protein 7.1 6.5 - 8.1 g/dL   Albumin 3.5 3.5 - 5.0 g/dL   AST 23 15 - 41 U/L   ALT 12 (L) 14 - 54 U/L   Alkaline Phosphatase 38 38 - 126 U/L   Total Bilirubin 0.5 0.3 - 1.2 mg/dL   GFR calc non Af Amer 35 (L) >60 mL/min   GFR calc Af Amer 40 (L) >60 mL/min    Comment: (NOTE) The eGFR has been calculated using the CKD EPI equation. This calculation has not been validated in all clinical situations. eGFR's persistently <60 mL/min signify possible Chronic Kidney Disease.    Anion gap 12 5 - 15  Magnesium     Status: None   Collection Time: 02/05/16  7:49 AM  Result Value Ref Range   Magnesium 1.8 1.7 - 2.4 mg/dL  Brain natriuretic peptide     Status: Abnormal   Collection Time: 02/05/16  7:49 AM  Result Value Ref Range   B Natriuretic Peptide 144.0 (H) 0.0 - 100.0 pg/mL  I-Stat Troponin, ED - 0, 3, 6 hours (not at [facility])     Status: Abnormal   Collection Time: 02/05/16  8:02 AM  Result Value Ref Range   Troponin i, poc 0.31 (HH) 0.00 - 0.08 ng/mL   Comment 3            Comment: Due to the release kinetics of  cTnI, a negative result within the first hours of the onset of symptoms does not rule out myocardial infarction with certainty. If myocardial infarction is still suspected, repeat the test at appropriate intervals.   I-Stat Troponin, ED - 0, 3, 6 hours (not at [facility])     Status: Abnormal   Collection Time: 02/05/16 11:15 AM  Result Value Ref Range   Troponin i, poc 0.28 (HH) 0.00 - 0.08 ng/mL  Comment NOTIFIED PHYSICIAN    Comment 3            Comment: Due to the release kinetics of cTnI, a negative result within the first hours of the onset of symptoms does not rule out myocardial infarction with certainty. If myocardial infarction is still suspected, repeat the test at appropriate intervals.   Troponin I     Status: Abnormal   Collection Time: 02/05/16 11:53 AM  Result Value Ref Range   Troponin I 0.38 (H) <0.031 ng/mL    Comment:        PERSISTENTLY INCREASED TROPONIN VALUES IN THE RANGE OF 0.04-0.49 ng/mL CAN BE SEEN IN:       -UNSTABLE ANGINA       -CONGESTIVE HEART FAILURE       -MYOCARDITIS       -CHEST TRAUMA       -ARRYHTHMIAS       -LATE PRESENTING MYOCARDIAL INFARCTION       -COPD   CLINICAL FOLLOW-UP RECOMMENDED.   I-Stat Troponin, ED - 0, 3, 6 hours (not at [facility])     Status: Abnormal   Collection Time: 02/05/16  1:55 PM  Result Value Ref Range   Troponin i, poc 0.29 (HH) 0.00 - 0.08 ng/mL   Comment NOTIFIED PHYSICIAN    Comment 3            Comment: Due to the release kinetics of cTnI, a negative result within the first hours of the onset of symptoms does not rule out myocardial infarction with certainty. If myocardial infarction is still suspected, repeat the test at appropriate intervals.   Troponin I-serum (0, 3, 6 hours)     Status: Abnormal   Collection Time: 02/05/16  7:00 PM  Result Value Ref Range   Troponin I 0.41 (H) <0.031 ng/mL    Comment:        PERSISTENTLY INCREASED TROPONIN VALUES IN THE RANGE OF 0.04-0.49 ng/mL CAN BE SEEN IN:        -UNSTABLE ANGINA       -CONGESTIVE HEART FAILURE       -MYOCARDITIS       -CHEST TRAUMA       -ARRYHTHMIAS       -LATE PRESENTING MYOCARDIAL INFARCTION       -COPD   CLINICAL FOLLOW-UP RECOMMENDED.   Troponin I-serum (0, 3, 6 hours)     Status: Abnormal   Collection Time: 02/05/16  9:59 PM  Result Value Ref Range   Troponin I 0.38 (H) <0.031 ng/mL    Comment:        PERSISTENTLY INCREASED TROPONIN VALUES IN THE RANGE OF 0.04-0.49 ng/mL CAN BE SEEN IN:       -UNSTABLE ANGINA       -CONGESTIVE HEART FAILURE       -MYOCARDITIS       -CHEST TRAUMA       -ARRYHTHMIAS       -LATE PRESENTING MYOCARDIAL INFARCTION       -COPD   CLINICAL FOLLOW-UP RECOMMENDED.   CBC     Status: Abnormal   Collection Time: 02/06/16  7:57 AM  Result Value Ref Range   WBC 3.9 (L) 4.0 - 10.5 K/uL   RBC 3.48 (L) 3.87 - 5.11 MIL/uL   Hemoglobin 9.7 (L) 12.0 - 15.0 g/dL   HCT 31.1 (L) 36.0 - 46.0 %   MCV 89.4 78.0 - 100.0 fL   MCH 27.9 26.0 - 34.0 pg   MCHC 31.2  30.0 - 36.0 g/dL   RDW 14.2 11.5 - 15.5 %   Platelets 201 150 - 400 K/uL    IMAGING: Dg Chest 2 View  02/04/2016  CLINICAL DATA:  Dry cough and wheezing. EXAM: CHEST  2 VIEW COMPARISON:  11/25/2015 FINDINGS: Right-sided pacemaker remains in place. Cardiomegaly again seen, unchanged from prior. Improved pulmonary edema. No confluent airspace disease. No pleural effusion or pneumothorax. Osseous structures are intact. IMPRESSION: Improved pulmonary edema.  Unchanged cardiomegaly Electronically Signed   By: Jeb Levering M.D.   On: 02/04/2016 23:06    HOSPITAL DIAGNOSES: Active Problems:   Chest pain   Chest discomfort   COPD exacerbation (HCC)   Palpitations   IMPRESSION: 1. Non-cardiac chest pain 2. Chronically elevated troponin 3. COPD exacerbation  RECOMMENDATION: 1. Mrs. Montone-Polk described a funny feeling in her chest associated with some lightheadedness after being upset at her son and this may have possibly been  related to hypertension. She has severe LVH and likely small vessel coronary disease which could be a cause of her elevated troponin or could be related to stage III chronic kidney disease. Nuclear stress testing 2 months ago was negative for ischemia and showed a small fixed anterior defect which was unchanged compared to prior study. She seems to be wheezy and mildly short of breath which is more likely the cause of her chest discomfort. Given the flat troponin elevation, I would not recommend further workup at this time - catheterization would be very risky with regard to contrast nephropathy.  Recommend follow-up with her primary cardiologist after discharge.  Time Spent Directly with Patient: 30 minutes  Pixie Casino, MD, [facility] Attending Cardiologist [address] 02/06/2016, 8:43 AM

## 2016-02-06 NOTE — Discharge Summary (Signed)
[facility] Discharge Summary  Patient name: [name] Medical record number: [id number] Date of birth: May 12, 1946 Age: 70 y.o. Gender: female Date of Admission: 02/05/2016  Date of Discharge: 02/06/2016 Admitting Physician: Leeanne Rio, MD  Primary Care Provider: Tawanna Sat, MD Consultants: Cardiac   Indication for Hospitalization: ACS rule out and COPD exacerbation   Discharge Diagnoses/Problem List:  HTN HLD complete heart block s/p pacemaker atrial fibrillation.   Disposition: Home   Discharge Condition: Stable   Discharge Exam:  General: Patient lying bed, NAD  Cardiovascular: Venticularly paced HR sounds.  Respiratory: mild wheezing throughout  Abdomen: BS+, no ttp, no rebound, no guarding  Extremities: No lower extremity edema   Brief Hospital Course:  Patient was admitted for chest pain. She had mildly elevated troponin levels of 0.38, 0.41, and 0.38. EKG showed a ventricular paced rhythm. Chest x-ray was negative for any acute cardiopulmonary process. Cardiology was consulted and stated that they did not believe that this chest pain was cardiac in nature. Furthermore, patient had a nuclear stress test 2 months ago which was negative for ischemia and only showed a small fixed anterior defect was unchanged from prior studies. They felt that the flat troponin elevation would not recommend itself to catheterization. On admission, patient was noted to be wheezy throughout, and received prednisone 20 mg for possible COPD exacerbation for total 5 days. Patient to then restart up home prednisone once completing the exacerbation course. Patient was walked with saturations remaining at 97% while ambulating before discharge.  Issues for Follow Up:  1. Patient to receive 4 more days of prednisone at 20 mg, before returning to home dose prednisone. 2. Please follow up on patient's CKD and creatinine as necessary 3. Reason ensure  that patient also up with cardiology  Significant Procedures: None   Significant Labs and Imaging:    Recent Labs Lab 02/05/16 1153 02/05/16 1900 02/05/16 2159 02/06/16 0757 02/06/16 1402  TROPONINI 0.38* 0.41* 0.38* 0.37* 0.39*     Recent Labs Lab 02/05/16 0749 02/06/16 0757  WBC 3.8* 3.9*  HGB 10.0* 9.7*  HCT 31.8* 31.1*  PLT 205 201    Recent Labs Lab 02/05/16 0749 02/06/16 0757  NA 140 143  K 4.1 4.1  CL 103 105  CO2 25 27  GLUCOSE 107* 114*  BUN 28* 19  CREATININE 1.46* 1.38*  CALCIUM 10.1 9.8  MG 1.8  --   ALKPHOS 38 33*  AST 23 20  ALT 12* 11*  ALBUMIN 3.5 3.0*   Dg Chest 2 View  02/04/2016  CLINICAL DATA:  Dry cough and wheezing. EXAM: CHEST  2 VIEW COMPARISON:  11/25/2015 FINDINGS: Right-sided pacemaker remains in place. Cardiomegaly again seen, unchanged from prior. Improved pulmonary edema. No confluent airspace disease. No pleural effusion or pneumothorax. Osseous structures are intact. IMPRESSION: Improved pulmonary edema.  Unchanged cardiomegaly Electronically Signed   By: Jeb Levering M.D.   On: 02/04/2016 23:06     Results/Tests Pending at Time of Discharge: None  Discharge Medications:    Medication List    STOP taking these medications        doxycycline 100 MG tablet  Commonly known as:  VIBRA-TABS      TAKE these medications        acetaminophen 325 MG tablet  Commonly known as:  TYLENOL  Take 325 mg by mouth daily as needed (pain).     albuterol 108 (90 Base) MCG/ACT inhaler  Commonly known as:  PROVENTIL HFA;VENTOLIN  HFA  Inhale 2 puffs into the lungs every 6 (six) hours as needed for wheezing or shortness of breath.     ALPRAZolam 0.5 MG tablet  Commonly known as:  XANAX  TAKE 1 TABLET BY MOUTH DAILY AS NEEDED FOR ANXIETY OR SLEEP     aspirin EC 81 MG tablet  Take 81 mg by mouth daily.     Calcium-Vitamin D 600-200 MG-UNIT tablet  Take 1 tablet by mouth daily.     carvedilol 25 MG tablet  Commonly known as:   COREG  Take 0.5 tablets (12.5 mg total) by mouth 2 (two) times daily.     esomeprazole 40 MG capsule  Commonly known as:  NEXIUM  TAKE ONE CAPSULE BY MOUTH EVERY MORNING BEFORE BREAKFAST     ferrous sulfate 325 (65 FE) MG tablet  TAKE 1 TABLET BY MOUTH TWICE DAILY WITH MEALS TO KEEP BLOOD COUNT UP     furosemide 20 MG tablet  Commonly known as:  LASIX  Take 1 tablet (20 mg total) by mouth daily. Except for Monday, Wednesday and Friday take 40 mg by mouth daily.     nitroGLYCERIN 0.4 MG SL tablet  Commonly known as:  NITROSTAT  Place 1 tablet (0.4 mg total) under the tongue every 5 (five) minutes as needed for chest pain.     predniSONE 20 MG tablet  Commonly known as:  DELTASONE  Take 1 tablet (20 mg total) by mouth daily with breakfast.     predniSONE 1 MG tablet  Commonly known as:  DELTASONE  Take 9 tablets (9 mg total) by mouth daily with breakfast.  Start taking on:  02/11/2016     rosuvastatin 20 MG tablet  Commonly known as:  CRESTOR  TAKE 1 TABLET BY MOUTH DAILY ON MONDAY, WEDNESDAY, AND FRIDAY     senna 8.6 MG tablet  Commonly known as:  SENOKOT  Take 1 tablet by mouth daily as needed for constipation.     sucralfate 1 g tablet  Commonly known as:  CARAFATE  TAKE 1 TABLET BY MOUTH TWICE DAILY        Discharge Instructions: Please refer to Patient Instructions section of EMR for full details.  Patient was counseled important signs and symptoms that should prompt return to medical care, changes in medications, dietary instructions, activity restrictions, and follow up appointments.   Follow-Up Appointments:   Tonette Bihari, MD 02/06/2016, 1:48 PM PGY-1, [place]

## 2016-02-06 NOTE — Progress Notes (Signed)
Pt ambulated in hall with standby assist only. Physical therapy order dc'd. Pt remained 97% on room air while ambulating. Carroll Kinds RN

## 2016-02-06 NOTE — Care Management Obs Status (Signed)
MEDICARE OBSERVATION STATUS NOTIFICATION   Patient Details  Name: Sifa Troeger MRN: [id number] Date of Birth: 1946-09-22   Medicare Observation Status Notification Given:  Yes (Admitted for CP CE + (Nuclear stress testing 2 months ago was negative for ischemia and showed a small fixed anterior defect which was unchanged compared to prior study) Cardiology is following. )    Bethena Roys, RN 02/06/2016, 12:39 PM

## 2016-02-06 NOTE — Progress Notes (Signed)
Family Medicine Teaching Service Daily Progress Note Intern Pager: [phone]  Patient name: [name] Medical record number: [id number] Date of birth: 10/04/1946 Age: 70 y.o. Gender: female  Primary Care Provider: Tawanna Sat, MD Consultants: None  Code Status: Full   Pt Overview and Major Events to Date:   Assessment and Plan: [name] is a 70 y.o. female presenting with chest pain. PMH is significant for HTN, HLD, complete heart block s/p pacemaker, and atrial fibrillation.   #Chest pain/ACS rule out: troponin mildly elevated above baseline (0.15) with a ventricular paced rhythm on EKG. She suffers from anxiety attacks and took a valium last night with minimal improvement in her pain and may also be contributing to her pain/"flutters." Myoview performed in December 2016 showing no ischemia and no indication for cath. She was seen by her Cardiologist on 01/25/16 that reported she had no events on her pacemakers.  - EKG ventricular paced,  - Troponin 0.38>0.41> 0.38 - consider Consult cardiology in AM- no Cath at this time  - Cont ASA 81 mg - Nitro PRN  #HFpEF: most recent ECHO showing EF 65-70% with severe concentric hypertrophy and grade 2 DD. Appears euvolemic on exam with no crackles and no shortness of breath - continue coreg and lasix 20 mg (Sun, Tue, Thu, Sat) and Lasix (mon, wed, fri)   #Complete heart block s/p Permanent pacemaker ([employer]): recent interrogation showed no arrhythmias on 2/10 - monitor on telemetry  #COPD: Continues to have mild wheezing. On room air, no worsening of SOB. Quit tobacco use roughly 3-4 years ago. She was recently seen on February 16 and started on doxycycline. - she should have completed her course of doxycycline.  - Increase prednisone to 20 mg for COPD exacerbation.  - continue albuterol PRN  - consider outpatient PFT's   #Chronic A.fib: No anticoagulation with a history of AVMs and GI bleeding. Has seen Dr. Rayann Heman  in the past and a possible candidate for Watchman device - Continue carvedilol  #HLD/HTN: BP 140/mid 90s in ED.  - Continue Lasix - Continue Crestor   #Polymyalgia rheumatica: She reports intermittent use of her prednisone and has recently been tapering. Most likely her complaints of musculoskeletal pain (shoulder and neck) are associated with this versus arthritic changes.    #CKDIII: Cr today seems to be around her baseline. She has had some proteinuria in the past. - Consider outpatient nephrology consult   #Normocytic anemia: Upon chart review is noted that she has a history of iron deficiency anemia. May be associated with bleeding from her AVM has ferritin has been normal in the past.  - consider outpatient work up.   Disposition: Med-surg   Subjective:  Patient not currently with chest pain; has had intermittent chest pain overnight. Has some SOB breath,but not worsening. Denies nausea, vomiting, fevers of chills.   Objective: Temp:  [98.1 F (36.7 C)-98.4 F (36.9 C)] 98.1 F (36.7 C) (02/22 0500) Pulse Rate:  [69-72] 70 (02/22 0500) Resp:  [16-27] 18 (02/22 0500) BP: (111-143)/(60-95) 117/73 mmHg (02/22 0500) SpO2:  [91 %-100 %] 91 % (02/22 0500) Weight:  [172 lb 6.4 oz (78.2 kg)-173 lb 4.8 oz (78.608 kg)] 172 lb 6.4 oz (78.2 kg) (02/22 0500) Physical Exam: General: Patient lying bed, NAD  Cardiovascular: Venticularly paced HR sounds.  Respiratory: mild wheezing throughout  Abdomen: BS+, no ttp, no rebound, no guarding  Extremities: No lower extremity edema   Laboratory:  Recent Labs Lab 02/05/16 0749  WBC 3.8*  HGB  10.0*  HCT 31.8*  PLT 205    Recent Labs Lab 02/05/16 0749  NA 140  K 4.1  CL 103  CO2 25  BUN 28*  CREATININE 1.46*  CALCIUM 10.1  PROT 7.1  BILITOT 0.5  ALKPHOS 38  ALT 12*  AST 23  GLUCOSE 107*     Imaging/Diagnostic Tests: Dg Chest 2 View  02/04/2016  CLINICAL DATA:  Dry cough and wheezing. EXAM: CHEST  2 VIEW  COMPARISON:  11/25/2015 FINDINGS: Right-sided pacemaker remains in place. Cardiomegaly again seen, unchanged from prior. Improved pulmonary edema. No confluent airspace disease. No pleural effusion or pneumothorax. Osseous structures are intact. IMPRESSION: Improved pulmonary edema.  Unchanged cardiomegaly Electronically Signed   By: Jeb Levering M.D.   On: 02/04/2016 23:06     Orlandus Borowski Cletis Media, MD 02/06/2016, 7:25 AM PGY-1, [place] Intern pager: [phone], text pages welcome

## 2016-02-07 ENCOUNTER — Encounter: Payer: Self-pay | Admitting: Family Medicine

## 2016-02-07 ENCOUNTER — Ambulatory Visit (INDEPENDENT_AMBULATORY_CARE_PROVIDER_SITE_OTHER): Payer: Medicare Other | Admitting: Family Medicine

## 2016-02-07 ENCOUNTER — Other Ambulatory Visit: Payer: Self-pay | Admitting: Family Medicine

## 2016-02-07 VITALS — BP 116/77 | HR 70 | Temp 97.6°F | Ht 65.0 in | Wt 173.0 lb

## 2016-02-07 DIAGNOSIS — M353 Polymyalgia rheumatica: Secondary | ICD-10-CM

## 2016-02-07 DIAGNOSIS — I5030 Unspecified diastolic (congestive) heart failure: Secondary | ICD-10-CM

## 2016-02-07 DIAGNOSIS — J441 Chronic obstructive pulmonary disease with (acute) exacerbation: Secondary | ICD-10-CM | POA: Diagnosis not present

## 2016-02-07 MED ORDER — CARVEDILOL 12.5 MG PO TABS: 12.5000 mg | ORAL_TABLET | Freq: Two times a day (BID) | ORAL | Status: DC

## 2016-02-07 MED ORDER — ROSUVASTATIN CALCIUM 20 MG PO TABS: ORAL_TABLET | ORAL | Status: DC

## 2016-02-07 NOTE — Progress Notes (Signed)
Patient ID: [name], female   DOB: [birth year], 70 y.o.   MRN: [id number]   Subjective:  This history was provided by the patient and medical record.   [name] is a 70 y.o. female who  has a past medical history of angioedema; anemia; arteriovenous malformation of stomach; atrial fibrillation; renal failure; hyperlipidemia; depression; diastolic heart failure;  hypertension; complete heart block ([place]); hypertrophic cardiomyopathy; history of alcohol abuse; CHF, angina and GERD.   Patient presents to clinic for follow-up after recent hospitalization for COPD exacerbation.    Patient was hospitalized on 2/21 with SOB and chest tightness.  She was discharged on 2/22 with diagnosis of COPD exacerbation.  Prior to hospitalization, patient was supposed to have been taking 5 mg of Prednisone at home each day, but stated that mediation makes her feel "light headed and dizzy" so she had discontinued the medication.  She began to have dyspnea and chest tightness on 2/21 following several days of URI symptoms and sought emergency care.  Upon discharge, patient was instructed to begin taking 20 mg Prednisone a day until 2/27 when she should resume taking 5 mg of Prednisone a day.  Patient has not taken any prednisone since discharge from hospital yesterday.    Patient seems confused about how to take many of her home medications.  She has a bottle of Crestor from last June that she is supposed to be taking M-W-F.  The bottle still has medication in it.  Although she experienced myalgias while taking the Crestor daily, that resolved when she cut back to three times a week.  Unclear as to why she has not continued taking the medication as prescribed.  She has similar confusion regarding the use of Carvedilol, iron supplements and prednisone as previously mentioned.    Patient states cough and dyspnea developed several days after she began having nasal congestion and drainage.  Patient continues  to have cough, but states it is improved since 2/21.  Dyspnea and chest discomfort which took her to the hospital have improved since yesterday.  She still has some light headed sensations, particularly upon standing.  Patient believes her medications make her dizzy.  Patient endorses nausea, but denies vomiting, diarrhea and fever.  Patient also denies chest pain at present and abdominal pain.  Review of Systems:  Per HPI. All other systems reviewed and are negative.   PMH, PSH, Medications, Allergies, and FmHx reviewed and updated in EMR.  Social History: former smoker  Objective:  BP 116/77 mmHg  Pulse 70  Temp(Src) 97.6 F (36.4 C) (Oral)  Ht _0  (1.651 m)  Wt 173 lb (78.472 kg)  BMI 28.79 kg/m2  General: 70 y.o. female Awake, alert, well-nourished and non-toxic in appearance HEENT: Normal    Neck: No masses palpated. No LAD    Eyes: Sclera white with no injection.  No drainage noted.    Nose: nasal turbinates moist, mildly inflamed with rhinorrhea    Throat: MMM, no erythema or swelling Cardio: RRR, S1S2 heard, no murmurs appreciated, no lower extremity edema, JVD, cyanosis or clubbing; +2 radial pulses bilaterally Pulm: Wheezing lower lobes that clear with cough.  Otherwise, clear to auscultation bilaterally GI: Soft, not tender, no distension,+BS x4, no hepatomegaly, no splenomegaly Neuro: Strength and sensation grossly intact  Assessment & Plan:  [name] is a 70 y.o. female here for follow-up after recent hospitalization for COPD exacerbation.  Patient is improving and states the cough and dyspnea are much better than 3 days ago.  She still has a productive cough, but is well appearing and in no distress.  Patient does not express good understanding about how to take her prednisone or other medications.  Clarified for patient that according to cardiology's last office note on the 10th of this month, she is to take Carvedilol 12.5 mg by mouth twice a day.  Per  hospital discharge instructions, she should also take 20 mg of Prednisone 2/23, 2/24, 2/25 and 2/26, returning to 5 mg of Prednisone a day on 2/27.  This was written out for her on her discharge instructions in clinic today.  Patient expressed interest in remaining on Crestor and asked for a refill of that medication as well as Nexium.  Reviewed instructions for Crestor, which are currently 20 mg M-W-F.  Discussed pill boxes with patient and she states she has them, but unclear as to whether or not she uses one.  Patient has met with pharmacy clinical in past and another consult with them may be beneficial.  Asked patient to schedule an appointment with the pharmacy clinic and bring all medications, both prescription and OTC, including vitamins and supplements, with her to that appointment.    May consider referral to geriatric clinic for evaluation related to patient's difficulty recalling recent events and instructions.  This has been noted previously, and during this exam she was unable to repeat instructions for taking medications that we had just discussed 5 minutes before.  She lives alone with drop-in assistance from her adult children and ambulates independently.  When she feels well, she still drives.  Sherron Ales, NP Student [facility] 02/07/2016 3:10 PM  RESIDENT ADDENDUM I have separately seen and examined the patient. I have discussed the findings and exam with the NP student and agree with the above note (edited where needed).   Patient improved since discharge, breathing better. She is still very confused about her medication regimen, particularly with her prednisone despite taking extensive counseling over the past 2-3 visits I've had with her. She apparently does not experience a lot of side effects from discontinuing this abruptly. I instructed her to make sure she takes the 110m of prednisone for the next 4 days and then return to 53mprednisone. She also had particular  issues with her coreg. Given the number of medicines she brought in to clinic I asked her to schedule an appointment with the pharmacy clinic so that they can do an inventory and try to simplify this. She may also do well with a geri clinic evaluation, she currently lives along but she has demonstrated some confusion.  HF: on lasix COPD exacerbation: finished with antibiotics, needs to finish prednisone burst PMR: continuing to titrate prednisone aftre finishing burst   AnTawanna SatMD 02/09/2016, 1:11 PM PGY-3, [place]

## 2016-02-07 NOTE — Patient Instructions (Signed)
Please schedule a visit with the Pharmacy clinic.  Bring ALL of your medicines    Carvedilol - TAKE 12.5MG  TWICE DAY. CUT THE 25MG  TABLETS IN HALF

## 2016-02-14 ENCOUNTER — Encounter: Payer: Self-pay | Admitting: Pharmacist

## 2016-02-14 ENCOUNTER — Ambulatory Visit (INDEPENDENT_AMBULATORY_CARE_PROVIDER_SITE_OTHER): Payer: Medicare Other | Admitting: Pharmacist

## 2016-02-14 VITALS — BP 138/85 | HR 70 | Wt 175.0 lb

## 2016-02-14 DIAGNOSIS — M353 Polymyalgia rheumatica: Secondary | ICD-10-CM

## 2016-02-14 NOTE — Progress Notes (Signed)
S:    Patient arrives in good spirits, ambulating without assistance.  Presents for medication review at the request of Dr. Lamar Benes. Patient was referred on 02/07/16.  Patient was last seen by Primary Care Provider on 02/07/16.   Patient presents to clinic with all of her medications.  Patient reports adherence with medications but admits to continued confusion regarding prednisone dose.  At patient's last office visit with Dr. Lamar Benes, patient was instructed to take prednisone 20 mg for four days then reduce dose to 5 mg daily (5 of the 1 mg prednisone tablets).  Patient has been taking prednisone 20 mg daily since office visit with Dr. Lamar Benes on 02/07/16.     O:  Lab Results  Component Value Date   HGBA1C 6.0 11/25/2013   Filed Vitals:   02/14/16 1411  BP: 138/85  Pulse: 70    A/P: Patient had all medications and reports compliance with medications.  Reviewed prednisone dosing instructions with patient and advised her to reduce prednisone dose to 5 mg daily as instructed by Dr. Lamar Benes.  Patient had 4 bottles of prednisone (one bottle of prednisone 1 mg tablets, two bottles of prednisone 10 mg tablets, and one bottle of prednisone 20 mg tablet).  Will properly dispose of prednisone 10 and 20 mg tablets to prevent further confusion for patient.  Patient also had several expired/discontinued medications with her.  Assisted patient in properly disposing of the following medications: albuterol HFA, aspirin, carvedilol, doxycycline, loratadine, nitroglycerin patches.    Written patient instructions provided.  Total time in face to face counseling 30 minutes.  Follow up with Dr. Lamar Benes in 2-3 weeks.   Patient seen with Rudolpho Sevin, PharmD Candidate, Nuala Alpha, PharmD Resident, and Elisabeth Most, PharmD Resident.

## 2016-02-14 NOTE — Assessment & Plan Note (Signed)
Patient had all medications and reports compliance with medications.  Reviewed prednisone dosing instructions with patient and advised her to reduce prednisone dose to 5 mg daily as instructed by Dr. Lamar Benes.  Patient had 4 bottles of prednisone (one bottle of prednisone 1 mg tablets, two bottles of prednisone 10 mg tablets, and one bottle of prednisone 20 mg tablet).  Will properly dispose of prednisone 10 and 20 mg tablets to prevent further confusion for patient.  Patient also had several expired/discontinued medications with her.  Assisted patient in properly disposing of the following medications: albuterol HFA, aspirin, carvedilol, doxycycline, loratadine, nitroglycerin patches.

## 2016-02-14 NOTE — Patient Instructions (Addendum)
Nice to meet you today.  It was nice to review your medications.   Please take 5 of the 1mg  tablets daily.  Continue this dose until you follow up with Dr. Lamar Benes.   Please schedule a visit to return to Dr. Lamar Benes in 2-3 weeks.

## 2016-02-15 NOTE — Progress Notes (Signed)
Patient ID: [name], female   DOB: [birth year], 70 y.o.   MRN: [id number] Reviewed: Agree with Dr. Graylin Shiver documentation and management.

## 2016-02-22 ENCOUNTER — Ambulatory Visit (INDEPENDENT_AMBULATORY_CARE_PROVIDER_SITE_OTHER): Payer: Medicare Other | Admitting: Cardiovascular Disease

## 2016-02-22 ENCOUNTER — Encounter: Payer: Self-pay | Admitting: Cardiovascular Disease

## 2016-02-22 VITALS — BP 120/80 | HR 70 | Ht 64.0 in | Wt 174.8 lb

## 2016-02-22 DIAGNOSIS — E785 Hyperlipidemia, unspecified: Secondary | ICD-10-CM

## 2016-02-22 DIAGNOSIS — I110 Hypertensive heart disease with heart failure: Secondary | ICD-10-CM | POA: Diagnosis not present

## 2016-02-22 DIAGNOSIS — R42 Dizziness and giddiness: Secondary | ICD-10-CM

## 2016-02-22 DIAGNOSIS — I5032 Chronic diastolic (congestive) heart failure: Secondary | ICD-10-CM | POA: Diagnosis not present

## 2016-02-22 DIAGNOSIS — I248 Other forms of acute ischemic heart disease: Secondary | ICD-10-CM

## 2016-02-22 DIAGNOSIS — F419 Anxiety disorder, unspecified: Secondary | ICD-10-CM

## 2016-02-22 DIAGNOSIS — I422 Other hypertrophic cardiomyopathy: Secondary | ICD-10-CM | POA: Diagnosis not present

## 2016-02-22 NOTE — Progress Notes (Signed)
Cardiology Office Note   Date:  02/23/2016   ID:  [name], DOB February 06, 1946, MRN [id number]  PCP:  Tawanna Sat, MD  Cardiologist:   Sharol Harness, MD  Electrophysiologist: Thompson Grayer, MD   Chief Complaint  Patient presents with  . Advice Only    1 month follow up     Patient ID: [name] is a 70 y.o. female with hypertension, hyperlipidemia, CKD III, chronic atrial fibrillation, chronic diastolic heart failure, complete heart block status post pacemaker ([employer]. [employer]), hypertrophic cardiomyopathy, and COPD who presents for follow up.    Interval History 02/22/16:  After her last appointment carvedilol was reduced due to dizziness.  She was felt to have musculoskeletal chest pain and was instructed to start on Tylenol.  Since then she was seen in the emergency department with cough and wheezing.  She also noted a "funny feeling in the chest."  cardiology was due to an elevation of troponin to 0.4.  It was felt that cardiac cath given her recent negative stress test and concern for contrast nephropathy.  She was very wheezy, which was thought to be the cause of her symtpoms.  She was treated for a COPD exacerbation with prednisone.  He followed up in clinic the day after discharge and it was noted that she was very confused about her medications.  She had not been taking prednisone as prescribed. There were also in consistencies with the amount of Crestor in her bottle.  The medication was prescribed in June and she still had tablets.    [name] continues to have lightheadedness and shortness of breath with exertion.  She also notices these symptomst if she is in crowds or is driiving.  She feels anxious and xanax helps.  She has also tried nitroglycerin or albuterol, but Xanax helps the most.  She reports having a lot on her mind.  She also notes that she doesn't want to leave her house much anymore.  She has fluttering in her chest that occurs  daily.  Episodes last for a few seconds and are not associated with chest pain but she does get short of breath. She reports dizziness that occurs daily.  It doesn't typically occur when going from sitting from standing, but she does notice it when she is laying in bed and rolls over.  She also report hearing loss and a lot of cerumen.  Interval History 01/25/16:  [name] presents for a same day appointment with multiple complaints.  She reports intermittent heart fluttering that is not associated with chest pain or shortness of breath. She also has soreness in her left shoulder and swelling across her left neck and shoulder. Additionally, she reports lightheadedness but typically occurs when she changes positions or after walking.She denies syncope. Last Wednesday she awoke diaphoretic with numbness in both hands. For she thought she was having a stroke However she denied any focal denied dysarthria. Eventually the numbness in her hands went away.Last night she had a difficult time sleeping because of the pain in her neck.  [name] reports swelling in her knees but not in her lower legs or ankles. She denies orthopnea or PND. She weighs herself regularly and her weight has remained between 176 and 180 pounds. She denies any recent dietary indiscretion.  History of Present Illness 12/14/15:  [name] is a patient of Dr. Rayann Heman.  She last saw Dr. Rayann Heman on 7/25, at which time she was doing well and was referred to  general cardiology to establish care.  She has chronic atrial fibrillation and is ventricularly paced for complete heart block.  She was admitted to the hospital from 12/11-12/13.  She presented with chest pain.  He was noted to be mildly volume overloaded on exam and BNP was elevated to 436. Chest x-ray was concerning for heart failure.  Echo showed normal systolic function and grade 2 diastolic dysfunction. There was no intracavitary gradient.  She was diuresed with IV  Lasix.  Her hospitalization was complicated by acute renal failure and was discharged with a creatinine of 2.3. She followed up with her primary care provider on 12/2 and her creatinine was 1.69.  Troponin was mildly elevated to 0.19. She underwent Lexiscan stress testing that was negative for ischemia.  [name] is not on anticoagulation due to prior GI bleed.   She has not been feeling well since discharge from the hospital. She continues to have palpitations but no longer has chest pain.  This occurs daily 3-4 times per day.  The episodes last for a few minutes at a time and get better with taking a deep breath or rubbing her chest.  The symptoms are not exertional and typically occur at rest. She denies , lightheadedness, or dizziness. She does, however, note lightheadedness when bending over and standing up too quickly. She denies lower extremity edema, orthopnea or PND.   Past Medical History  Diagnosis Date  . Angioedema     2/2 ACE  . RLS (restless legs syndrome)     Dx 06/2007  . Anemia   . Arteriovenous malformation of stomach   . Permanent atrial fibrillation ([place])   . Renal failure     baseline creatinine 1.6  . Hyperlipidemia   . Depression   . Diastolic heart failure   . Arthritis   . Sleep apnea   . Asthma   . Hx of colonic polyp 08/13/10    adenomatous  . Fatty liver 07/26/10  . Panic attacks   . Hypertension   . Complete heart block ([place])     s/p PPM 1998  . Hypertrophic cardiomyopathy ([place])     dx by Dr [name] 2009  . Hx of colonoscopy   . GI bleed   . Panic attack   . DDD (degenerative disc disease)   . History of alcohol abuse Stopped Fall 2012  . History of tobacco use Quit Fall 2012  . Shortness of breath     sob on exertation  . CHF (congestive heart failure) ([place])   . Angina   . GERD (gastroesophageal reflux disease)   . AVM (arteriovenous malformation) of colon     small intestine; stomach  . Right arm pain 01/08/2012  . Hyperlipidemia     . Blood transfusion   . Hx of cardiovascular stress test     a. Lexiscan Myoview (10/15):  Small inferolateral and apical defect c/w scar and poss soft tissue attenuation, no ischemia, EF 42%  . Iron deficiency anemia     Past Surgical History  Procedure Laterality Date  . Tubal ligation  04/01/2000  . Pacemaker insertion  Levy, most recent gen change by [name] 4/12  . Cardiac catheterization    . Esophagogastroduodenoscopy  12/23/2011    Procedure: ESOPHAGOGASTRODUODENOSCOPY (EGD);  Surgeon: [name], MD;  Location: [facility];  Service: Endoscopy;  Laterality: N/A;  . Givens capsule study  12/23/2011    Procedure: GIVENS CAPSULE STUDY;  Surgeon: [name],  MD;  Location: [facility];  Service: Endoscopy;  Laterality: N/A;     Current Outpatient Prescriptions  Medication Sig Dispense Refill  . acetaminophen (TYLENOL) 325 MG tablet Take 325 mg by mouth daily as needed (pain).    [name] albuterol (PROVENTIL HFA;VENTOLIN HFA) 108 (90 Base) MCG/ACT inhaler Inhale 2 puffs into the lungs every 6 (six) hours as needed for wheezing or shortness of breath. 1 Inhaler 2  . ALPRAZolam (XANAX) 0.5 MG tablet TAKE 1 TABLET BY MOUTH DAILY AS NEEDED FOR ANXIETY OR SLEEP (Patient taking differently: TAKE 1/2 TABLET BY MOUTH DAILY AS NEEDED FOR ANXIETY OR SLEEP) 15 tablet 1  . aspirin EC 81 MG tablet Take 81 mg by mouth daily.     . Calcium-Vitamin D 600-200 MG-UNIT tablet Take 1 tablet by mouth daily. (Patient taking differently: Take 1 tablet by mouth every other day. ) 30 tablet 0  . carvedilol (COREG) 12.5 MG tablet Take 1 tablet (12.5 mg total) by mouth 2 (two) times daily. 60 tablet 3  . Coenzyme Q-10 200 MG CAPS Take 1 capsule by mouth as needed.     [name] esomeprazole (NEXIUM) 40 MG capsule Take 40 mg by mouth as needed (heartburn).    . ferrous sulfate 325 (65 FE) MG tablet TAKE 1 TABLET BY MOUTH TWICE DAILY WITH MEALS TO KEEP BLOOD COUNT UP 60 tablet 11  . furosemide (LASIX) 20 MG  tablet Take 1 tablet (20 mg total) by mouth daily. Except for Monday, Wednesday and Friday take 40 mg by mouth daily. 135 tablet 3  . Multiple Vitamin (MULTIVITAMIN) capsule Take 1 capsule by mouth daily.    . nitroGLYCERIN (NITROSTAT) 0.4 MG SL tablet Place 1 tablet (0.4 mg total) under the tongue every 5 (five) minutes as needed for chest pain. 25 tablet 1  . Omega-3 Fatty Acids (FISH OIL) 500 MG CAPS Take 1 capsule by mouth as needed.     . predniSONE (DELTASONE) 1 MG tablet Take 1 mg by mouth as directed. Take 5 tablets by mouth daily    . rosuvastatin (CRESTOR) 20 MG tablet TAKE 1 TABLET BY MOUTH DAILY ON MONDAY, WEDNESDAY, AND FRIDAY 38 tablet 3  . senna (SENOKOT) 8.6 MG tablet Take 1 tablet by mouth daily as needed for constipation.     . sucralfate (CARAFATE) 1 g tablet Take 1 g by mouth as needed (for heartburn).     Current Facility-Administered Medications  Medication Dose Route Frequency Provider Last Rate Last Dose  . cyanocobalamin ((VITAMIN B-12)) injection 1,000 mcg  1,000 mcg Intramuscular Q30 days [name], MD   1,000 mcg at 09/16/11 1341    Allergies:   Ace inhibitors    Social History:  The patient  reports that she quit smoking about 4 years ago. Her smoking use included Cigarettes. She has a 5 pack-year smoking history. She has never used smokeless tobacco. She reports that she drinks alcohol. She reports that she does not use illicit drugs.   Family History:  The patient's family history includes Alcohol abuse in her brother and father; Aneurysm in her mother; CVA in her mother; Cancer in her brother; Colon cancer in her brother; Diabetes in her brother and sister; Heart attack in her brother and father; Heart disease in her mother and sister; Hypertension in her brother, father, mother, and sister; Stroke in her father and mother.    ROS:  Please see the history of present illness.   Otherwise, review of systems are positive for  nothing.   All other systems are  reviewed and negative.    PHYSICAL EXAM: VS:  BP 120/80 mmHg  Pulse 70  Ht 5\' 4"  (1.626 m)  Wt 79.289 kg (174 lb 12.8 oz)  BMI 29.99 kg/m2 , BMI Body mass index is 29.99 kg/(m^2). GENERAL:  Well appearing HEENT:  Pupils equal round and reactive, fundi not visualized, oral mucosa unremarkable.   4 beat nystagmus with lateral gaze bilaterally. NECK:  No jugular venous distention, waveform within normal limits, carotid upstroke brisk and symmetric, no bruits.  L neck tender to palpation. LYMPHATICS:  No cervical adenopathy LUNGS:  Clear to auscultation bilaterally HEART:  RRR.  PMI not displaced or sustained,S1 and S2 within normal limits, no S3, no S4, no clicks, no rubs, no murmurs CHEST: L chest wall tender to palpation ABD:  Flat, positive bowel sounds normal in frequency in pitch, no bruits, no rebound, no guarding, no midline pulsatile mass, no hepatomegaly, no splenomegaly EXT:  2 plus pulses throughout, no edema, no cyanosis no clubbing SKIN:  No rashes no nodules NEURO:  Cranial nerves II through XII grossly intact, motor grossly intact throughout PSYCH:  Cognitively intact, oriented to person place and time   EKG:  EKG is ordered today. The ekg ordered today demonstrates ventricularly paced at 70 bpm. Likely atrial fibrillation.  Echo 11/26/15: Study Conclusions  - Left ventricle: The cavity size was normal. There was severe concentric hypertrophy. Systolic function was vigorous. The estimated ejection fraction was in the range of 65% to 70%. Wall motion was normal; there were no regional wall motion abnormalities. Features are consistent with a pseudonormal left ventricular filling pattern, with concomitant abnormal relaxation and increased filling pressure (grade 2 diastolic dysfunction). - Aortic valve: Trileaflet; normal thickness, mildly calcified leaflets. - Mitral valve: There was trivial regurgitation. - Left atrium: The atrium was mildly  dilated. - Atrial septum: There was increased thickness of the septum, consistent with lipomatous hypertrophy. - Tricuspid valve: There was trivial regurgitation.  ICD Interrogation 01/25/16:  [employer] Just single chamber pacemaker Battery 2.78 V Presenting rhythm is atrial fibrillation Intermittently paced at 70 bpm. Underlying rhythm atrial fibrillation with complete heart block Threshold: 1V @ 0.20ms Sensing: not performed due to complete heart block. Impedance: 436 Ohms  No events noted   Recent Labs: 04/05/2015: TSH 0.76 02/05/2016: B Natriuretic Peptide 144.0*; Magnesium 1.8 02/06/2016: ALT 11*; BUN 19; Creatinine, Ser 1.38*; Hemoglobin 9.7*; Platelets 201; Potassium 4.1; Sodium 143    Lipid Panel    Component Value Date/Time   CHOL 122 03/28/2015 1504   TRIG 86 03/28/2015 1504   HDL 42* 03/28/2015 1504   CHOLHDL 2.9 03/28/2015 1504   VLDL 17 03/28/2015 1504   LDLCALC 63 03/28/2015 1504   LDLDIRECT 161.8 02/21/2013 1416      Wt Readings from Last 3 Encounters:  02/22/16 79.289 kg (174 lb 12.8 oz)  02/14/16 79.379 kg (175 lb)  02/07/16 78.472 kg (173 lb)      ASSESSMENT AND PLAN:  # Hypertension: Blood pressure well-controlled on lower dose of carvedilol.  She is not orthostatic today.   Continue carvedilol and lasix.  # Dizziness: [name] reports dizziness.  She is not orthostatic in clinic today.  She does have nystagmus on exam and reports symptoms that could be consistent with vertigo.  She also reports hearing loss and increased cerumen.  We will refer her to ENT for further evaluation and treatment.  # Hyperlipidemia: Continue rosuvastatin.  # Chronic atrial fibrillation, PPM,  CHB:  Managed by Dr. Rayann Heman. She remains in atrial fibrillaiton today.  Continue carvedilol and aspirin.  She is not on anticoagulation due to a history of GI bleed.  This patients CHA2DS2-VASc Score and unadjusted Ischemic Stroke Rate (% per year) is equal to 2.2 % stroke  rate/year from a score of 2  Above score calculated as 1 point each if present [CHF, HTN, DM, Vascular=MI/PAD/Aortic Plaque, Age if 65-74, or Female] Above score calculated as 2 points each if present [Age > 75, or Stroke/TIA/TE]  # Chronic diastolic heart failure,  HCM, demand ischemia:  [name] is euvolemic on exam today and her shortness of breath has improved. Elevated troponin at her last hospitalization was likely demand ischemia in the setting of COPD exacerbation vs subendocardial ischemia 2/2 HCM.  Cath was deferred given her recent negative stress.  Continue carvedilol and lasix.  Current medicines are reviewed at length with the patient today.  The patient does not have concerns regarding medicines.  The following changes have been made:  none  Labs/ tests ordered today include:   No orders of the defined types were placed in this encounter.     Disposition:   FU with Zeynep Fantroy C. Oval Linsey, MD, [facility] in 3 months.    Signed, Raynetta Osterloh C. Oval Linsey, MD, [facility]  02/23/2016 3:55 PM    [place]

## 2016-02-22 NOTE — Patient Instructions (Addendum)
Medication Instructions:  Your physician recommends that you continue on your current medications as directed. Please refer to the Current Medication list given to you today.  Labwork: NONE  Testing/Procedures: NONE  Follow-Up: Your physician recommends that you schedule a follow-up appointment in: 3 MONTH OV   Any Other Special Instructions Will Be Listed Below (If Applicable). WILL ARRANGE FOR YOU TO SEE [facility] FOR YOUR DIZZINESS  If you need a refill on your cardiac medications before your next appointment, please call your pharmacy.

## 2016-02-23 ENCOUNTER — Encounter: Payer: Self-pay | Admitting: Cardiovascular Disease

## 2016-02-28 ENCOUNTER — Ambulatory Visit: Payer: Medicare Other | Admitting: Family Medicine

## 2016-03-03 DIAGNOSIS — R42 Dizziness and giddiness: Secondary | ICD-10-CM | POA: Diagnosis not present

## 2016-03-04 ENCOUNTER — Other Ambulatory Visit: Payer: Self-pay | Admitting: Family Medicine

## 2016-03-07 NOTE — Telephone Encounter (Signed)
2nd request received from pharmacy. Cailin Gebel,CMA  

## 2016-03-17 ENCOUNTER — Telehealth: Payer: Self-pay | Admitting: Cardiovascular Disease

## 2016-03-17 ENCOUNTER — Encounter: Payer: Self-pay | Admitting: Internal Medicine

## 2016-03-17 ENCOUNTER — Ambulatory Visit (INDEPENDENT_AMBULATORY_CARE_PROVIDER_SITE_OTHER): Payer: Medicare Other | Admitting: *Deleted

## 2016-03-17 DIAGNOSIS — I482 Chronic atrial fibrillation, unspecified: Secondary | ICD-10-CM

## 2016-03-17 LAB — CUP PACEART INCLINIC DEVICE CHECK
Battery Impedance: 1000 Ohm — CL
Battery Remaining Longevity: 96 mo
Battery Voltage: 2.78 V
Date Time Interrogation Session: 20170403165249
Implantable Lead Implant Date: 19860725
Implantable Lead Implant Date: 19860725
Implantable Lead Location: 753859
Implantable Lead Location: 753860
Implantable Lead Serial Number: 652511
Lead Channel Impedance Value: 412 Ohm
Lead Channel Pacing Threshold Amplitude: 1 V
Lead Channel Pacing Threshold Pulse Width: 0.5 ms
Lead Channel Setting Pacing Amplitude: 2.5 V
Lead Channel Setting Pacing Pulse Width: 0.5 ms
Lead Channel Setting Sensing Sensitivity: 8 mV
Pulse Gen Serial Number: 2406848

## 2016-03-17 NOTE — Progress Notes (Signed)
Pacemaker check in clinic for ShOB, palps, and "knot" at ppm site. Device site assessed. No ecchymosis or edema noted. The "knot" at the side of the device pocket was the actual side of pacemaker. This was explained to the patient. Patient was counciled about sodium restrictions and how increased sodium can cause an increase in her ShOB and abdominal bloat. Patient was also counciled about the proper usage of her NTG and when to call 911. We also talked about how and when she should use her albuterol inhaler.  Pacemaker was checked due to c/o palpitations. Normal device function. Threshold and impedance consistent with previous measurements---patient does have a unipolar lead. Device programmed to maximize longevity. Permanent AF + ASA. No high ventricular rates noted. Device programmed at appropriate safety margins. Histogram distribution appropriate for patient activity level. Device programmed to optimize intrinsic conduction. Estimated longevity 8 years. Patient will follow up with AS in 06-2016.

## 2016-03-17 NOTE — Telephone Encounter (Signed)
Spoke to patient regarding "Knot" at device site. Patient denies any redness, drainage, or swelling at the site.   Patient also c/o occasional palpitations and a "funny feeling" in her chest. Appt made for 4/3 @ 1400 for a device check. Patient voiced understanding.

## 2016-03-17 NOTE — Telephone Encounter (Signed)
New Message  Patient c/o Palpitations:  High priority if patient c/o lightheadedness and shortness of breath.  1. How long have you been having palpitations? Yesterday   2. Are you currently experiencing lightheadedness and shortness of breath? Lightheaded, SOB, bloated  3. Have you checked your BP and heart rate? (document readings) n/a  4. Are you experiencing any other symptoms? swollen Knot @ device site

## 2016-03-21 ENCOUNTER — Other Ambulatory Visit: Payer: Self-pay

## 2016-03-21 DIAGNOSIS — Z1231 Encounter for screening mammogram for malignant neoplasm of breast: Secondary | ICD-10-CM

## 2016-04-11 ENCOUNTER — Ambulatory Visit
Admission: RE | Admit: 2016-04-11 | Discharge: 2016-04-11 | Disposition: A | Discharge status: Home / Self Care | Payer: Medicare Other | Source: Ambulatory Visit

## 2016-04-11 DIAGNOSIS — Z1231 Encounter for screening mammogram for malignant neoplasm of breast: Secondary | ICD-10-CM

## 2016-04-15 ENCOUNTER — Other Ambulatory Visit: Payer: Self-pay | Admitting: Internal Medicine

## 2016-04-15 DIAGNOSIS — R928 Other abnormal and inconclusive findings on diagnostic imaging of breast: Secondary | ICD-10-CM

## 2016-04-18 ENCOUNTER — Other Ambulatory Visit: Payer: Self-pay | Admitting: Family Medicine

## 2016-04-18 DIAGNOSIS — R928 Other abnormal and inconclusive findings on diagnostic imaging of breast: Secondary | ICD-10-CM

## 2016-04-21 ENCOUNTER — Other Ambulatory Visit: Payer: Self-pay | Admitting: Family Medicine

## 2016-04-21 ENCOUNTER — Other Ambulatory Visit: Payer: Self-pay | Admitting: Physician Assistant

## 2016-04-21 ENCOUNTER — Ambulatory Visit
Admission: RE | Admit: 2016-04-21 | Discharge: 2016-04-21 | Disposition: A | Discharge status: Home / Self Care | Payer: Medicare Other | Source: Ambulatory Visit | Attending: Internal Medicine | Admitting: Internal Medicine

## 2016-04-21 DIAGNOSIS — R928 Other abnormal and inconclusive findings on diagnostic imaging of breast: Secondary | ICD-10-CM

## 2016-04-21 DIAGNOSIS — N63 Unspecified lump in breast: Secondary | ICD-10-CM | POA: Diagnosis not present

## 2016-04-21 DIAGNOSIS — N6001 Solitary cyst of right breast: Secondary | ICD-10-CM | POA: Diagnosis not present

## 2016-04-21 NOTE — Telephone Encounter (Signed)
Pt last seen in clinic in March.

## 2016-04-22 NOTE — Telephone Encounter (Signed)
I called pt to inform her of rx refill.... Pt stated she has been having "leg and ankle cramps," pt is unsure sure of the cause and the onset has been sudden. Pt wanted me to ask MD if "leg and ankle cramps" are a side effect of any of her medications? Please advise.

## 2016-04-23 NOTE — Telephone Encounter (Signed)
I believe she has complained of leg cramps off and on for several years. If she would like she can stop her rosuvastatin (cholesterol medicine) for 1-2 weeks to see if this gets better. If worsens should be seen in the clinic. Please call and relay this message, thanks. -Dr. Lamar Benes

## 2016-04-29 ENCOUNTER — Other Ambulatory Visit: Payer: Self-pay | Admitting: Family Medicine

## 2016-05-01 ENCOUNTER — Other Ambulatory Visit: Payer: Self-pay | Admitting: Family Medicine

## 2016-05-16 ENCOUNTER — Ambulatory Visit (INDEPENDENT_AMBULATORY_CARE_PROVIDER_SITE_OTHER): Payer: Medicare Other | Admitting: Cardiovascular Disease

## 2016-05-16 ENCOUNTER — Encounter: Payer: Self-pay | Admitting: Cardiovascular Disease

## 2016-05-16 VITALS — BP 116/82 | HR 71 | Ht 65.0 in | Wt 179.6 lb

## 2016-05-16 DIAGNOSIS — I482 Chronic atrial fibrillation, unspecified: Secondary | ICD-10-CM

## 2016-05-16 DIAGNOSIS — R42 Dizziness and giddiness: Secondary | ICD-10-CM

## 2016-05-16 DIAGNOSIS — I5032 Chronic diastolic (congestive) heart failure: Secondary | ICD-10-CM | POA: Diagnosis not present

## 2016-05-16 DIAGNOSIS — R002 Palpitations: Secondary | ICD-10-CM | POA: Diagnosis not present

## 2016-05-16 DIAGNOSIS — I110 Hypertensive heart disease with heart failure: Secondary | ICD-10-CM

## 2016-05-16 NOTE — Patient Instructions (Signed)
Medication Instructions:  Your physician recommends that you continue on your current medications as directed. Please refer to the Current Medication list given to you today.  Labwork: none  Testing/Procedures: none  Follow-Up: Your physician recommends that you schedule a follow-up appointment in: Dr Rayann Heman soon  Your physician wants you to follow-up in: 6 month ov You will receive a reminder letter in the mail two months in advance. If you don't receive a letter, please call our office to schedule the follow-up appointment.  If you need a refill on your cardiac medications before your next appointment, please call your pharmacy.

## 2016-05-16 NOTE — Progress Notes (Signed)
Cardiology Office Note   Date:  05/18/2016   ID:  [name], DOB 1946-11-14, MRN [id number]  PCP:  Tawanna Sat, MD  Cardiologist:   Skeet Latch, MD  Electrophysiologist: Thompson Grayer, MD   Chief Complaint  Patient presents with  . Follow-up    3 months  pt states flutters come and go; c/o SOB on exertion, lightheadedness, and cramping in lower legs  . Medication Question    would like to ask about Nitroglycerin patch    Patient ID: [name] is a 70 y.o. female with hypertension, hyperlipidemia, CKD III, chronic atrial fibrillation, (not anticoagulated due to GI bleed), chronic diastolic heart failure, complete heart block status post pacemaker ([employer]), hypertrophic cardiomyopathy, and COPD who presents for follow up.    History of Present Illness: [name] was first seen on 12/14/15 as a referral from Dr. Rayann Heman. She was referred to general cardiology to establish care. She had been admitted to the hospital in early December with a complaint of chest pain and was noted to be mildly volume overloaded. Her BNP was 436 and echo revealed normal systolic function with grade 2 diastolic dysfunction. She was diuresed with IV Lasix and developed acute renal failure. Troponin was mildly elevated to 0.19 Lexiscan Cardiolite was negative for ischemia.   [name] continues to struggle with palpitation and dizziness.  She has been in chronic atrial fibrillation and has not been on anticoagulation due to a GI bleed in 2012.  She has known AVMs. At her last appointment carvedilol was reduced due to dizziness.  However, she continues to have some dizziness.  It is worse when it is hot outside.  She notices fluttering in her chest most days of the week.  It is worse when she eats or drinks.    Past Medical History  Diagnosis Date  . Angioedema     2/2 ACE  . RLS (restless legs syndrome)     Dx 06/2007  . Anemia   . Arteriovenous  malformation of stomach   . Permanent atrial fibrillation ([place])   . Renal failure     baseline creatinine 1.6  . Hyperlipidemia   . Depression   . Diastolic heart failure   . Arthritis   . Sleep apnea   . Asthma   . Hx of colonic polyp 08/13/10    adenomatous  . Fatty liver 07/26/10  . Panic attacks   . Hypertension   . Complete heart block ([place])     s/p PPM 1998  . Hypertrophic cardiomyopathy ([place])     dx by Dr [name] 2009  . Hx of colonoscopy   . GI bleed   . Panic attack   . DDD (degenerative disc disease)   . History of alcohol abuse Stopped Fall 2012  . History of tobacco use Quit Fall 2012  . Shortness of breath     sob on exertation  . CHF (congestive heart failure) ([place])   . Angina   . GERD (gastroesophageal reflux disease)   . AVM (arteriovenous malformation) of colon     small intestine; stomach  . Right arm pain 01/08/2012  . Hyperlipidemia   . Blood transfusion   . Hx of cardiovascular stress test     a. Lexiscan Myoview (10/15):  Small inferolateral and apical defect c/w scar and poss soft tissue attenuation, no ischemia, EF 42%  . Iron deficiency anemia     Past Surgical History  Procedure Laterality Date  . Tubal ligation  04/01/2000  . Pacemaker insertion  [place], most recent gen change by [name] 4/12  . Cardiac catheterization    . Esophagogastroduodenoscopy  12/23/2011    Procedure: ESOPHAGOGASTRODUODENOSCOPY (EGD);  Surgeon: [name], MD;  Location: [facility];  Service: Endoscopy;  Laterality: N/A;  . Givens capsule study  12/23/2011    Procedure: GIVENS CAPSULE STUDY;  Surgeon: [name], MD;  Location: [facility];  Service: Endoscopy;  Laterality: N/A;     Current Outpatient Prescriptions  Medication Sig Dispense Refill  . acetaminophen (TYLENOL) 325 MG tablet Take 325 mg by mouth daily as needed (pain).    [name] ALPRAZolam (XANAX) 0.5 MG tablet TAKE 1 TABLET BY MOUTH DAILY AS NEEDED FOR ANXIETY OR SLEEP (Patient taking differently:  TAKE 1/2 TABLET BY MOUTH DAILY AS NEEDED FOR ANXIETY OR SLEEP) 15 tablet 1  . aspirin EC 81 MG tablet Take 81 mg by mouth daily.     . Calcium-Vitamin D 600-200 MG-UNIT tablet Take 1 tablet by mouth daily. (Patient taking differently: Take 1 tablet by mouth every other day. ) 30 tablet 0  . carvedilol (COREG) 12.5 MG tablet Take 1 tablet (12.5 mg total) by mouth 2 (two) times daily. 60 tablet 3  . Coenzyme Q-10 200 MG CAPS Take 1 capsule by mouth as needed.     [name] esomeprazole (NEXIUM) 40 MG capsule Take 40 mg by mouth as needed (heartburn).    . ferrous sulfate 325 (65 FE) MG tablet TAKE 1 TABLET BY MOUTH TWICE DAILY WITH MEALS TO KEEP BLOOD COUNT UP 60 tablet 11  . furosemide (LASIX) 20 MG tablet Take 20 mg by mouth daily. Takes 2 tablets if swelling or cramping.    . Multiple Vitamin (MULTIVITAMIN) capsule Take 1 capsule by mouth daily.    . nitroGLYCERIN (NITROSTAT) 0.4 MG SL tablet Place 1 tablet (0.4 mg total) under the tongue every 5 (five) minutes as needed for chest pain. 25 tablet 1  . NON FORMULARY Take 1 Dose by mouth daily. Caster oil    . predniSONE (DELTASONE) 1 MG tablet Take 7 tablets (7 mg total) by mouth daily with breakfast. 210 tablet 0  . PROAIR HFA 108 (90 Base) MCG/ACT inhaler INHALE 2 PUFFS INTO THE LUNGS EVERY 6 HOURS AS NEEDED FOR WHEEZING OR SHORTNESS OF BREATH 8.5 g 3  . rosuvastatin (CRESTOR) 20 MG tablet TAKE 1 TABLET BY MOUTH DAILY ON MONDAY, WEDNESDAY, AND FRIDAY 38 tablet 3  . senna (SENOKOT) 8.6 MG tablet Take 1 tablet by mouth daily as needed for constipation.     . sucralfate (CARAFATE) 1 g tablet Take 1 g by mouth as needed (for heartburn).     Current Facility-Administered Medications  Medication Dose Route Frequency Provider Last Rate Last Dose  . cyanocobalamin ((VITAMIN B-12)) injection 1,000 mcg  1,000 mcg Intramuscular Q30 days [name], MD   1,000 mcg at 09/16/11 1341    Allergies:   Ace inhibitors    Social History:  The patient  reports that  she quit smoking about 4 years ago. Her smoking use included Cigarettes. She has a 5 pack-year smoking history. She has never used smokeless tobacco. She reports that she drinks alcohol. She reports that she does not use illicit drugs.   Family History:  The patient's family history includes Alcohol abuse in her brother and father; Aneurysm in her mother; CVA in her mother; Cancer in her brother; Colon cancer in her brother; Diabetes in  her brother and sister; Heart attack in her brother and father; Heart disease in her mother and sister; Hypertension in her brother, father, mother, and sister; Stroke in her father and mother.    ROS:  Please see the history of present illness.   Otherwise, review of systems are positive for constipation.   All other systems are reviewed and negative.    PHYSICAL EXAM: VS:  BP 116/82 mmHg  Pulse 71  Ht 5\' 5"  (1.651 m)  Wt 179 lb 9.6 oz (81.466 kg)  BMI 29.89 kg/m2 , BMI Body mass index is 29.89 kg/(m^2). GENERAL:  Well appearing HEENT:  Pupils equal round and reactive, fundi not visualized, oral mucosa unremarkable.   4 beat nystagmus with lateral gaze bilaterally. NECK:  No jugular venous distention, waveform within normal limits, carotid upstroke brisk and symmetric, no bruits.  L neck tender to palpation. LYMPHATICS:  No cervical adenopathy LUNGS:  Clear to auscultation bilaterally HEART:  RRR.  PMI not displaced or sustained,S1 and S2 within normal limits, no S3, no S4, no clicks, no rubs, no murmurs CHEST: L chest wall tender to palpation ABD:  Flat, positive bowel sounds normal in frequency in pitch, no bruits, no rebound, no guarding, no midline pulsatile mass, no hepatomegaly, no splenomegaly EXT:  2 plus pulses throughout, no edema, no cyanosis no clubbing SKIN:  No rashes no nodules NEURO:  Cranial nerves II through XII grossly intact, motor grossly intact throughout PSYCH:  Cognitively intact, oriented to person place and time   EKG:  EKG is  ordered today. The ekg ordered today demonstrates ventricularly paced at 70 bpm. Likely atrial fibrillation.  Echo 11/26/15: Study Conclusions  - Left ventricle: The cavity size was normal. There was severe concentric hypertrophy. Systolic function was vigorous. The estimated ejection fraction was in the range of 65% to 70%. Wall motion was normal; there were no regional wall motion abnormalities. Features are consistent with a pseudonormal left ventricular filling pattern, with concomitant abnormal relaxation and increased filling pressure (grade 2 diastolic dysfunction). - Aortic valve: Trileaflet; normal thickness, mildly calcified leaflets. - Mitral valve: There was trivial regurgitation. - Left atrium: The atrium was mildly dilated. - Atrial septum: There was increased thickness of the septum, consistent with lipomatous hypertrophy. - Tricuspid valve: There was trivial regurgitation.  ICD Interrogation 01/25/16:  [employer] single chamber pacemaker Battery 2.78 V Presenting rhythm is atrial fibrillation Intermittently paced at 70 bpm. Underlying rhythm atrial fibrillation with complete heart block Threshold: 1V @ 0.14ms Sensing: not performed due to complete heart block. Impedance: 436 Ohms  No events noted   Recent Labs: 02/05/2016: B Natriuretic Peptide 144.0*; Magnesium 1.8 02/06/2016: ALT 11*; BUN 19; Creatinine, Ser 1.38*; Hemoglobin 9.7*; Platelets 201; Potassium 4.1; Sodium 143    Lipid Panel    Component Value Date/Time   CHOL 122 03/28/2015 1504   TRIG 86 03/28/2015 1504   HDL 42* 03/28/2015 1504   CHOLHDL 2.9 03/28/2015 1504   VLDL 17 03/28/2015 1504   LDLCALC 63 03/28/2015 1504   LDLDIRECT 161.8 02/21/2013 1416      Wt Readings from Last 3 Encounters:  05/16/16 179 lb 9.6 oz (81.466 kg)  02/22/16 174 lb 12.8 oz (79.289 kg)  02/14/16 175 lb (79.379 kg)      ASSESSMENT AND PLAN:  # Hypertension: Blood pressure well-controlled on  lower dose of carvedilol.  She is not orthostatic today.   Continue carvedilol and lasix.  # Dizziness: Ms. Arntz reports dizziness.  She is not  orthostatic in clinic today.  She was referred to ENT for evaluation.  She does have severe LVH, but there is no evidence of outflow tract obstruction on echo.   # Hyperlipidemia: Continue rosuvastatin.  LDL 63.  # Chronic atrial fibrillation, PPM, CHB:  Managed by Dr. Rayann Heman. She remains in atrial fibrillaiton today.  However, she has CHB and thus, a regular ventricular rate.  It is odd that she feels palpitations.  She has lost her atrial kick, which could conceivably make her feel more fatigued or short of breath.  However, I'm not sure this would really cause palpitations.  She has been deemed not a candidate for anticoagulation due to GI bleed in 2012.  She has tolerated aspirin, which has a higher GI bleeding risk than some NOACs.  I will refer her back to Dr. Rayann Heman for consideration of a Watchman device, with consideration of restoring sinus rhythm.  Although she has been in atrial fibrillation for quite some time, her left atrium is not very large.  Continue carvedilol and aspirin. This patients CHA2DS2-VASc Score and unadjusted Ischemic Stroke Rate (% per year) is equal to 3.2 % stroke rate/year from a score of 3  Above score calculated as 1 point each if present [CHF, HTN, DM, Vascular=MI/PAD/Aortic Plaque, Age if 65-74, or Female] Above score calculated as 2 points each if present [Age > 75, or Stroke/TIA/TE]  # Chronic diastolic heart failure,  HCM, demand ischemia:  Ms. Carmickle is euvolemic on exam today and her shortness of breath has improved.  Continue carvedilol and lasix.  Current medicines are reviewed at length with the patient today.  The patient does not have concerns regarding medicines.  The following changes have been made:  none  Labs/ tests ordered today include:   No orders of the defined types were placed in  this encounter.     Disposition:   FU with Lacresha Fusilier C. Oval Linsey, MD, [facility] in 6 months.    Signed, Salathiel Ferrara C. Oval Linsey, MD, [facility]  05/18/2016 9:23 PM    [place] she is she's grosslysince there is

## 2016-05-26 ENCOUNTER — Ambulatory Visit: Payer: Medicare Other | Admitting: Cardiovascular Disease

## 2016-05-30 ENCOUNTER — Encounter: Payer: Self-pay | Admitting: Internal Medicine

## 2016-05-30 ENCOUNTER — Ambulatory Visit (INDEPENDENT_AMBULATORY_CARE_PROVIDER_SITE_OTHER): Payer: Medicare Other | Admitting: Internal Medicine

## 2016-05-30 VITALS — BP 138/84 | HR 70 | Ht 65.0 in | Wt 184.0 lb

## 2016-05-30 DIAGNOSIS — I422 Other hypertrophic cardiomyopathy: Secondary | ICD-10-CM

## 2016-05-30 DIAGNOSIS — I482 Chronic atrial fibrillation, unspecified: Secondary | ICD-10-CM

## 2016-05-30 DIAGNOSIS — Q2730 Arteriovenous malformation, site unspecified: Secondary | ICD-10-CM | POA: Diagnosis not present

## 2016-05-30 DIAGNOSIS — I442 Atrioventricular block, complete: Secondary | ICD-10-CM

## 2016-05-30 NOTE — Patient Instructions (Signed)
Medication Instructions:  Your physician recommends that you continue on your current medications as directed. Please refer to the Current Medication list given to you today.   Labwork: None ordered   Testing/Procedures: Your physician has requested that you have a TEE. During a TEE, sound waves are used to create images of your heart. It provides your doctor with information about the size and shape of your heart and how well your heart's chambers and valves are working. In this test, a transducer is attached to the end of a flexible tube that's guided down your throat and into your esophagus (the tube leading from you mouth to your stomach) to get a more detailed image of your heart. You are not awake for the procedure. Please see the instruction sheet given to you today. For further information please visit [email or web address] needs to be done prior to proceeding with Watchman device(pt given handout on Watchman)      Follow-Up: Your physician wants you to follow-up in: 6 months with the device clinic and 12 months with Dr Rayann Heman Dennis Bast will receive a reminder letter in the mail two months in advance. If you don't receive a letter, please call our office to schedule the follow-up appointment.   Any Other Special Instructions Will Be Listed Below (If Applicable).     If you need a refill on your cardiac medications before your next appointment, please call your pharmacy.

## 2016-05-31 MED ORDER — FUROSEMIDE 20 MG PO TABS: ORAL_TABLET | ORAL | Status: DC

## 2016-05-31 NOTE — Progress Notes (Signed)
PCP: Tawanna Sat, MD Primary Cardiologist:  Dr Arita Miss is a 70 y.o. female who presents today for electrophysiology followup and to discuss Watchman.  Since last being seen in our clinic, the patient reports doing reasonably well. Her SOB is stable.  She continues to complain of frequent palpitations though she has complete heart block and is 100% V paced.   She has not recently been anticoagulated due to prior GI Bleeding/ AVMs.  Today, she denies symptoms of CP, lower extremity edema, dizziness, presyncope, or syncope.  The patient is otherwise without complaint today.   Past Medical History  Diagnosis Date  . Angioedema     2/2 ACE  . RLS (restless legs syndrome)     Dx 06/2007  . Anemia   . Arteriovenous malformation of stomach   . Permanent atrial fibrillation ([place])   . Renal failure     baseline creatinine 1.6  . Hyperlipidemia   . Depression   . Diastolic heart failure   . Arthritis   . Sleep apnea   . Asthma   . Hx of colonic polyp 08/13/10    adenomatous  . Fatty liver 07/26/10  . Panic attacks   . Hypertension   . Complete heart block ([place])     s/p PPM 1998  . Hypertrophic cardiomyopathy ([place])     dx by Dr [name] 2009  . Hx of colonoscopy   . GI bleed   . Panic attack   . DDD (degenerative disc disease)   . History of alcohol abuse Stopped Fall 2012  . History of tobacco use Quit Fall 2012  . Shortness of breath     sob on exertation  . CHF (congestive heart failure) ([place])   . Angina   . GERD (gastroesophageal reflux disease)   . AVM (arteriovenous malformation) of colon     small intestine; stomach  . Right arm pain 01/08/2012  . Hyperlipidemia   . Blood transfusion   . Hx of cardiovascular stress test     a. Lexiscan Myoview (10/15):  Small inferolateral and apical defect c/w scar and poss soft tissue attenuation, no ischemia, EF 42%  . Iron deficiency anemia    Past Surgical History  Procedure Laterality Date  . Tubal  ligation  04/01/2000  . Pacemaker insertion  [place], most recent gen change by [name] 4/12  . Cardiac catheterization    . Esophagogastroduodenoscopy  12/23/2011    Procedure: ESOPHAGOGASTRODUODENOSCOPY (EGD);  Surgeon: [name], MD;  Location: [facility];  Service: Endoscopy;  Laterality: N/A;  . Givens capsule study  12/23/2011    Procedure: GIVENS CAPSULE STUDY;  Surgeon: [name], MD;  Location: [facility];  Service: Endoscopy;  Laterality: N/A;    Current Outpatient Prescriptions  Medication Sig Dispense Refill  . acetaminophen (TYLENOL) 325 MG tablet Take 325 mg by mouth daily as needed (pain).    [name] ALPRAZolam (XANAX) 0.5 MG tablet Take 0.25-0.5 mg by mouth at bedtime as needed for anxiety.    [name] aspirin EC 81 MG tablet Take 81 mg by mouth daily.     [name] CALCIUM-VITAMIN D PO Take 1 tablet by mouth once a week.    . carvedilol (COREG) 12.5 MG tablet Take 1 tablet (12.5 mg total) by mouth 2 (two) times daily. 60 tablet 3  . Coenzyme Q-10 200 MG CAPS Take 1 capsule by mouth as directed.     [name] esomeprazole (NEXIUM) 40 MG  capsule Take 40 mg by mouth daily.     . ferrous sulfate 325 (65 FE) MG tablet TAKE 1 TABLET BY MOUTH TWICE DAILY WITH MEALS TO KEEP BLOOD COUNT UP 60 tablet 11  . furosemide (LASIX) 20 MG tablet Take 1 tablet by mouth on Tues, Thurs, Sat and Sun and take 2 tablets by mouth on Mon, Wed, Fri    . Multiple Vitamin (MULTIVITAMIN) capsule Take 1 capsule by mouth daily.    . nitroGLYCERIN (NITROSTAT) 0.4 MG SL tablet Place 1 tablet (0.4 mg total) under the tongue every 5 (five) minutes as needed for chest pain. 25 tablet 1  . NON FORMULARY Take 1 Dose by mouth daily. Caster oil    . predniSONE (DELTASONE) 1 MG tablet Take 7 tablets (7 mg total) by mouth daily with breakfast. 210 tablet 0  . PROAIR HFA 108 (90 Base) MCG/ACT inhaler INHALE 2 PUFFS INTO THE LUNGS EVERY 6 HOURS AS NEEDED FOR WHEEZING OR SHORTNESS OF BREATH 8.5 g 3  . rosuvastatin (CRESTOR) 20 MG tablet  TAKE 1 TABLET BY MOUTH DAILY ON MONDAY, WEDNESDAY, AND FRIDAY 38 tablet 3  . senna (SENOKOT) 8.6 MG tablet Take 1 tablet by mouth daily as needed for constipation.     . sucralfate (CARAFATE) 1 g tablet Take 1 g by mouth 2 (two) times daily.      Current Facility-Administered Medications  Medication Dose Route Frequency Provider Last Rate Last Dose  . cyanocobalamin ((VITAMIN B-12)) injection 1,000 mcg  1,000 mcg Intramuscular Q30 days [name], MD   1,000 mcg at 09/16/11 1341   ROS- all systems are reviewed and negative except as per HPI above  Physical Exam: Filed Vitals:   05/30/16 1519  BP: 138/84  Pulse: 70  Height: 5\' 5"  (1.651 m)  Weight: 184 lb (83.462 kg)  SpO2: 94%    GEN- The patient is well appearing, alert and oriented x 3 today.   Head- normocephalic, atraumatic Eyes-  Sclera clear, conjunctiva pink Ears- hearing intact Oropharynx- clear Lungs- Clear to ausculation bilaterally, normal work of breathing Chest- pacemaker pocket is well healed Heart- Regular rate and rhythm, (paced) GI- soft, NT, ND, + BS Extremities- no clubbing, cyanosis, or edema  Pacemaker interrogation- reviewed in detail today,  See PACEART report  Assessment and Plan:  1. Complete heart block Normal pacemaker function See Pace Art report 100% V paced.  I am not sure why she has a sensation of palpitations.  2. Permanent afib Not a candidate for long term anticoagulation due to prior GI bleeding.  I agree with Dr Oval Linsey that Watchman LAA occlusion is reasonable.  Her chads2vasc score is at least 4.  I discussed screening TEE and watchman with her today.  My intention was to initiate short term anticoagulation and start either tikosyn or amiodarone at time of watchman implant to allow for subsequent cardioversion and possible sinus rhythm (as proposed by Dr Oval Linsey).  Unfortunately, after discussing risks of the procedure, the patient declined stating that she was not ready at this  time.  I worry that she has limited understanding of the procedure.  I will therefore have Chanetta Marshall NP follow-up with the patient by phone in the next few days to see if she would reconsider.  She has shortness of breath, atypical chest pain, and palpitations which could possibly improve with sinus rhythm. I agree with Dr Oval Linsey that she is a short term but not long term candidate for anticoagulation.  3. HTN  Stable No change required today  4. Atypical chest pain (chronic) Low risk myoview previously   5. Chronic systolic dysfunction/ hocm--> no gradient previously Stable She has chronic SOB.  This is chronic and multifactoral.  I suspect that her chronic lung disease is part of the issue.  May improve with sinus.  Very difficult situation.  I appreciate Dr Blenda Mounts assistance.  Thompson Grayer MD, [facility]

## 2016-06-05 ENCOUNTER — Other Ambulatory Visit: Payer: Self-pay | Admitting: Family Medicine

## 2016-06-05 NOTE — Telephone Encounter (Signed)
Rx successfully called into pharmacy. Page, cma.

## 2016-06-06 LAB — CUP PACEART INCLINIC DEVICE CHECK
Battery Impedance: 1000 Ohm — CL
Battery Voltage: 2.78 V
Date Time Interrogation Session: 20170616194012
Implantable Lead Implant Date: 19860725
Implantable Lead Location: 753860
Lead Channel Pacing Threshold Pulse Width: 0.5 ms
MDC IDC LEAD IMPLANT DT: 19860725
MDC IDC LEAD LOCATION: 753859
MDC IDC LEAD SERIAL: 652511
MDC IDC MSMT LEADCHNL RV IMPEDANCE VALUE: 391 Ohm
MDC IDC MSMT LEADCHNL RV PACING THRESHOLD AMPLITUDE: 1.25 V
MDC IDC PG SERIAL: 2406848
MDC IDC SET LEADCHNL RV PACING AMPLITUDE: 2.5 V
MDC IDC SET LEADCHNL RV PACING PULSEWIDTH: 0.5 ms
MDC IDC SET LEADCHNL RV SENSING SENSITIVITY: 8 mV

## 2016-06-09 ENCOUNTER — Encounter: Payer: Self-pay | Admitting: Nurse Practitioner

## 2016-06-09 ENCOUNTER — Telehealth: Payer: Self-pay | Admitting: Nurse Practitioner

## 2016-06-09 NOTE — Telephone Encounter (Signed)
Spoke with patient in follow up about Watchman. Discussed extensively rationale for Watchman and procedure including risks/benefits.  She would like to proceed, but would like me to speak with her daughter about timing.  Will hold spot 7/27 for Watchman.  I have given patient my number to have her daughter call me this afternoon to go over procedure and instructions.   Her family has some scheduling challenges this summer and so will do TEE on the table same day as Watchman with no screening TEE ahead of time.   Chanetta Marshall, NP 06/09/2016 11:44 AM

## 2016-06-09 NOTE — Telephone Encounter (Signed)
Spoke with daughter and reviewed all information. She and her mother are willing to proceed with procedure. Scheduled for 7/27.  Instructions and watchman information mailed to patient today.

## 2016-06-21 ENCOUNTER — Other Ambulatory Visit: Payer: Self-pay | Admitting: Family Medicine

## 2016-06-21 DIAGNOSIS — M353 Polymyalgia rheumatica: Secondary | ICD-10-CM

## 2016-06-23 NOTE — Telephone Encounter (Signed)
Called in 1 month supply of prednisone to [facility] on [address]  PLEASE call patient to be seen as soon as possible and for her to bring in all the medications she is taking. Thank you

## 2016-07-02 ENCOUNTER — Telehealth: Payer: Self-pay | Admitting: Internal Medicine

## 2016-07-02 NOTE — Telephone Encounter (Signed)
New message ° ° ° ° °Pt returning nurse call. Please call.  °

## 2016-07-02 NOTE — Telephone Encounter (Signed)
Daughter spoke to [employer] and the proceudre has been scheduled

## 2016-07-09 ENCOUNTER — Telehealth: Payer: Self-pay | Admitting: Nurse Practitioner

## 2016-07-09 NOTE — Telephone Encounter (Signed)
Spoke with patient. Reviewed instructions for Watchman procedure tomorrow. Pt to arrive at [place] after midnight. Reviewed again that TEE results will determine if Watchman can be placed.  Chanetta Marshall, NP 07/09/2016 10:15 AM

## 2016-07-10 ENCOUNTER — Inpatient Hospital Stay (HOSPITAL_COMMUNITY): Payer: Medicare Other

## 2016-07-10 ENCOUNTER — Inpatient Hospital Stay (HOSPITAL_COMMUNITY): Payer: Medicare Other | Admitting: Certified Registered"

## 2016-07-10 ENCOUNTER — Encounter (HOSPITAL_COMMUNITY)
Admission: AD | Disposition: A | Discharge status: Skilled Nursing Facility | Payer: Self-pay | Source: Ambulatory Visit | Attending: Pulmonary Disease

## 2016-07-10 ENCOUNTER — Inpatient Hospital Stay (HOSPITAL_COMMUNITY)
Admission: AD | Admit: 2016-07-10 | Discharge: 2016-07-26 | DRG: 308 | Disposition: A | Discharge status: Skilled Nursing Facility | Payer: Medicare Other | Source: Ambulatory Visit | Attending: Internal Medicine | Admitting: Internal Medicine

## 2016-07-10 DIAGNOSIS — R58 Hemorrhage, not elsewhere classified: Secondary | ICD-10-CM | POA: Diagnosis not present

## 2016-07-10 DIAGNOSIS — I481 Persistent atrial fibrillation: Principal | ICD-10-CM | POA: Diagnosis present

## 2016-07-10 DIAGNOSIS — I4891 Unspecified atrial fibrillation: Secondary | ICD-10-CM | POA: Diagnosis present

## 2016-07-10 DIAGNOSIS — T41295A Adverse effect of other general anesthetics, initial encounter: Secondary | ICD-10-CM | POA: Diagnosis not present

## 2016-07-10 DIAGNOSIS — R570 Cardiogenic shock: Secondary | ICD-10-CM | POA: Diagnosis not present

## 2016-07-10 DIAGNOSIS — R4182 Altered mental status, unspecified: Secondary | ICD-10-CM | POA: Diagnosis not present

## 2016-07-10 DIAGNOSIS — K5900 Constipation, unspecified: Secondary | ICD-10-CM | POA: Diagnosis not present

## 2016-07-10 DIAGNOSIS — R319 Hematuria, unspecified: Secondary | ICD-10-CM | POA: Diagnosis not present

## 2016-07-10 DIAGNOSIS — J9811 Atelectasis: Secondary | ICD-10-CM | POA: Diagnosis not present

## 2016-07-10 DIAGNOSIS — M353 Polymyalgia rheumatica: Secondary | ICD-10-CM | POA: Diagnosis not present

## 2016-07-10 DIAGNOSIS — R410 Disorientation, unspecified: Secondary | ICD-10-CM | POA: Diagnosis not present

## 2016-07-10 DIAGNOSIS — K219 Gastro-esophageal reflux disease without esophagitis: Secondary | ICD-10-CM | POA: Diagnosis present

## 2016-07-10 DIAGNOSIS — R400 Somnolence: Secondary | ICD-10-CM

## 2016-07-10 DIAGNOSIS — J9602 Acute respiratory failure with hypercapnia: Secondary | ICD-10-CM | POA: Diagnosis not present

## 2016-07-10 DIAGNOSIS — J9600 Acute respiratory failure, unspecified whether with hypoxia or hypercapnia: Secondary | ICD-10-CM | POA: Diagnosis not present

## 2016-07-10 DIAGNOSIS — Z789 Other specified health status: Secondary | ICD-10-CM | POA: Diagnosis not present

## 2016-07-10 DIAGNOSIS — K229 Disease of esophagus, unspecified: Secondary | ICD-10-CM

## 2016-07-10 DIAGNOSIS — E662 Morbid (severe) obesity with alveolar hypoventilation: Secondary | ICD-10-CM | POA: Diagnosis present

## 2016-07-10 DIAGNOSIS — N183 Chronic kidney disease, stage 3 (moderate): Secondary | ICD-10-CM | POA: Diagnosis not present

## 2016-07-10 DIAGNOSIS — I442 Atrioventricular block, complete: Secondary | ICD-10-CM | POA: Diagnosis not present

## 2016-07-10 DIAGNOSIS — I248 Other forms of acute ischemic heart disease: Secondary | ICD-10-CM | POA: Diagnosis not present

## 2016-07-10 DIAGNOSIS — Z01818 Encounter for other preprocedural examination: Secondary | ICD-10-CM

## 2016-07-10 DIAGNOSIS — I2489 Other forms of acute ischemic heart disease: Secondary | ICD-10-CM

## 2016-07-10 DIAGNOSIS — I429 Cardiomyopathy, unspecified: Secondary | ICD-10-CM | POA: Diagnosis not present

## 2016-07-10 DIAGNOSIS — R29898 Other symptoms and signs involving the musculoskeletal system: Secondary | ICD-10-CM

## 2016-07-10 DIAGNOSIS — M1990 Unspecified osteoarthritis, unspecified site: Secondary | ICD-10-CM | POA: Diagnosis not present

## 2016-07-10 DIAGNOSIS — I421 Obstructive hypertrophic cardiomyopathy: Secondary | ICD-10-CM | POA: Diagnosis present

## 2016-07-10 DIAGNOSIS — I5043 Acute on chronic combined systolic (congestive) and diastolic (congestive) heart failure: Secondary | ICD-10-CM | POA: Diagnosis not present

## 2016-07-10 DIAGNOSIS — N390 Urinary tract infection, site not specified: Secondary | ICD-10-CM | POA: Diagnosis not present

## 2016-07-10 DIAGNOSIS — T380X5A Adverse effect of glucocorticoids and synthetic analogues, initial encounter: Secondary | ICD-10-CM | POA: Diagnosis not present

## 2016-07-10 DIAGNOSIS — T884XXA Failed or difficult intubation, initial encounter: Secondary | ICD-10-CM | POA: Diagnosis not present

## 2016-07-10 DIAGNOSIS — Z6830 Body mass index (BMI) 30.0-30.9, adult: Secondary | ICD-10-CM | POA: Diagnosis not present

## 2016-07-10 DIAGNOSIS — Z09 Encounter for follow-up examination after completed treatment for conditions other than malignant neoplasm: Secondary | ICD-10-CM | POA: Diagnosis not present

## 2016-07-10 DIAGNOSIS — Z7952 Long term (current) use of systemic steroids: Secondary | ICD-10-CM | POA: Diagnosis not present

## 2016-07-10 DIAGNOSIS — E785 Hyperlipidemia, unspecified: Secondary | ICD-10-CM | POA: Diagnosis present

## 2016-07-10 DIAGNOSIS — M6281 Muscle weakness (generalized): Secondary | ICD-10-CM | POA: Diagnosis not present

## 2016-07-10 DIAGNOSIS — D649 Anemia, unspecified: Secondary | ICD-10-CM | POA: Diagnosis not present

## 2016-07-10 DIAGNOSIS — Z950 Presence of cardiac pacemaker: Secondary | ICD-10-CM

## 2016-07-10 DIAGNOSIS — J384 Edema of larynx: Secondary | ICD-10-CM | POA: Diagnosis not present

## 2016-07-10 DIAGNOSIS — R1319 Other dysphagia: Secondary | ICD-10-CM

## 2016-07-10 DIAGNOSIS — K5909 Other constipation: Secondary | ICD-10-CM | POA: Diagnosis not present

## 2016-07-10 DIAGNOSIS — G2581 Restless legs syndrome: Secondary | ICD-10-CM | POA: Diagnosis present

## 2016-07-10 DIAGNOSIS — I422 Other hypertrophic cardiomyopathy: Secondary | ICD-10-CM | POA: Diagnosis present

## 2016-07-10 DIAGNOSIS — K5520 Angiodysplasia of colon without hemorrhage: Secondary | ICD-10-CM | POA: Diagnosis present

## 2016-07-10 DIAGNOSIS — I482 Chronic atrial fibrillation: Secondary | ICD-10-CM | POA: Diagnosis not present

## 2016-07-10 DIAGNOSIS — J392 Other diseases of pharynx: Secondary | ICD-10-CM | POA: Diagnosis not present

## 2016-07-10 DIAGNOSIS — J9690 Respiratory failure, unspecified, unspecified whether with hypoxia or hypercapnia: Secondary | ICD-10-CM | POA: Diagnosis not present

## 2016-07-10 DIAGNOSIS — R531 Weakness: Secondary | ICD-10-CM | POA: Diagnosis not present

## 2016-07-10 DIAGNOSIS — J449 Chronic obstructive pulmonary disease, unspecified: Secondary | ICD-10-CM | POA: Diagnosis present

## 2016-07-10 DIAGNOSIS — N179 Acute kidney failure, unspecified: Secondary | ICD-10-CM | POA: Diagnosis not present

## 2016-07-10 DIAGNOSIS — I130 Hypertensive heart and chronic kidney disease with heart failure and stage 1 through stage 4 chronic kidney disease, or unspecified chronic kidney disease: Secondary | ICD-10-CM | POA: Diagnosis not present

## 2016-07-10 DIAGNOSIS — G9340 Encephalopathy, unspecified: Secondary | ICD-10-CM

## 2016-07-10 DIAGNOSIS — R0689 Other abnormalities of breathing: Secondary | ICD-10-CM | POA: Diagnosis not present

## 2016-07-10 DIAGNOSIS — Z538 Procedure and treatment not carried out for other reasons: Secondary | ICD-10-CM | POA: Diagnosis not present

## 2016-07-10 DIAGNOSIS — I952 Hypotension due to drugs: Secondary | ICD-10-CM | POA: Diagnosis not present

## 2016-07-10 DIAGNOSIS — J9601 Acute respiratory failure with hypoxia: Secondary | ICD-10-CM

## 2016-07-10 DIAGNOSIS — R1310 Dysphagia, unspecified: Secondary | ICD-10-CM

## 2016-07-10 DIAGNOSIS — Z79899 Other long term (current) drug therapy: Secondary | ICD-10-CM

## 2016-07-10 DIAGNOSIS — F17210 Nicotine dependence, cigarettes, uncomplicated: Secondary | ICD-10-CM | POA: Diagnosis present

## 2016-07-10 DIAGNOSIS — I509 Heart failure, unspecified: Secondary | ICD-10-CM | POA: Diagnosis not present

## 2016-07-10 DIAGNOSIS — I5021 Acute systolic (congestive) heart failure: Secondary | ICD-10-CM | POA: Diagnosis not present

## 2016-07-10 DIAGNOSIS — Z978 Presence of other specified devices: Secondary | ICD-10-CM

## 2016-07-10 DIAGNOSIS — R569 Unspecified convulsions: Secondary | ICD-10-CM | POA: Diagnosis not present

## 2016-07-10 DIAGNOSIS — I4819 Other persistent atrial fibrillation: Secondary | ICD-10-CM | POA: Diagnosis present

## 2016-07-10 DIAGNOSIS — R278 Other lack of coordination: Secondary | ICD-10-CM | POA: Diagnosis not present

## 2016-07-10 DIAGNOSIS — K1230 Oral mucositis (ulcerative), unspecified: Secondary | ICD-10-CM | POA: Diagnosis not present

## 2016-07-10 DIAGNOSIS — Z4682 Encounter for fitting and adjustment of non-vascular catheter: Secondary | ICD-10-CM | POA: Diagnosis not present

## 2016-07-10 LAB — TYPE AND SCREEN
ABO/RH(D): O POS
Antibody Screen: NEGATIVE

## 2016-07-10 LAB — BASIC METABOLIC PANEL
Anion gap: 7 (ref 5–15)
Anion gap: 6 (ref 5–15)
BUN: 25 mg/dL — AB (ref 6–20)
BUN: 24 mg/dL — AB (ref 6–20)
CALCIUM: 9.5 mg/dL (ref 8.9–10.3)
CHLORIDE: 108 mmol/L (ref 101–111)
CO2: 26 mmol/L (ref 22–32)
CO2: 25 mmol/L (ref 22–32)
CREATININE: 1.28 mg/dL — AB (ref 0.44–1.00)
Calcium: 9.4 mg/dL (ref 8.9–10.3)
Chloride: 108 mmol/L (ref 101–111)
Creatinine, Ser: 1.32 mg/dL — ABNORMAL HIGH (ref 0.44–1.00)
GFR calc Af Amer: 45 mL/min — ABNORMAL LOW (ref >60)
GFR calc Af Amer: 47 mL/min — ABNORMAL LOW (ref >60)
GFR calc non Af Amer: 41 mL/min — ABNORMAL LOW (ref >60)
GFR, EST NON AFRICAN AMERICAN: 39 mL/min — AB (ref >60)
GLUCOSE: 111 mg/dL — AB (ref 65–99)
Glucose, Bld: 158 mg/dL — ABNORMAL HIGH (ref 65–99)
POTASSIUM: 4.2 mmol/L (ref 3.5–5.1)
Potassium: 4.7 mmol/L (ref 3.5–5.1)
SODIUM: 139 mmol/L (ref 135–145)
Sodium: 141 mmol/L (ref 135–145)

## 2016-07-10 LAB — POCT I-STAT 3, ART BLOOD GAS (G3+)
ACID-BASE EXCESS: 2 mmol/L (ref 0.0–2.0)
Bicarbonate: 28.8 mEq/L — ABNORMAL HIGH (ref 20.0–24.0)
O2 SAT: 100 %
PCO2 ART: 56.2 mmHg — AB (ref 35.0–45.0)
PO2 ART: 496 mmHg — AB (ref 80.0–100.0)
TCO2: 31 mmol/L (ref 0–100)
pH, Arterial: 7.318 — ABNORMAL LOW (ref 7.350–7.450)

## 2016-07-10 LAB — CBC
HCT: 29.3 % — ABNORMAL LOW (ref 36.0–46.0)
HEMATOCRIT: 29.1 % — AB (ref 36.0–46.0)
HEMOGLOBIN: 8.8 g/dL — AB (ref 12.0–15.0)
Hemoglobin: 8.5 g/dL — ABNORMAL LOW (ref 12.0–15.0)
MCH: 25.5 pg — AB (ref 26.0–34.0)
MCH: 26.3 pg (ref 26.0–34.0)
MCHC: 29 g/dL — ABNORMAL LOW (ref 30.0–36.0)
MCHC: 30.2 g/dL (ref 30.0–36.0)
MCV: 88 fL (ref 78.0–100.0)
MCV: 87.1 fL (ref 78.0–100.0)
PLATELETS: 214 10*3/uL (ref 150–400)
Platelets: 193 10*3/uL (ref 150–400)
RBC: 3.33 MIL/uL — ABNORMAL LOW (ref 3.87–5.11)
RBC: 3.34 MIL/uL — ABNORMAL LOW (ref 3.87–5.11)
RDW: 15.1 % (ref 11.5–15.5)
RDW: 15.1 % (ref 11.5–15.5)
WBC: 4.4 10*3/uL (ref 4.0–10.5)
WBC: 6.4 10*3/uL (ref 4.0–10.5)

## 2016-07-10 LAB — MRSA PCR SCREENING: MRSA by PCR: NEGATIVE

## 2016-07-10 LAB — LACTIC ACID, PLASMA: LACTIC ACID, VENOUS: 1.1 mmol/L (ref 0.5–1.9)

## 2016-07-10 SURGERY — INVASIVE LAB ABORTED CASE
Anesthesia: General

## 2016-07-10 MED ORDER — METHYLPREDNISOLONE SODIUM SUCC 125 MG IJ SOLR
INTRAMUSCULAR | Status: AC
  Filled 2016-07-10: qty 2

## 2016-07-10 MED ORDER — EPINEPHRINE HCL 0.1 MG/ML IJ SOSY
PREFILLED_SYRINGE | INTRAMUSCULAR | Status: DC | PRN
  Administered 2016-07-10 (×2): 1 via INTRAVENOUS

## 2016-07-10 MED ORDER — PREDNISONE 10 MG PO TABS
5.0000 mg | ORAL_TABLET | Freq: Every day | ORAL | Status: DC
  Administered 2016-07-11 – 2016-07-12 (×2): 5 mg
  Filled 2016-07-10 (×2): qty 1

## 2016-07-10 MED ORDER — LACTATED RINGERS IV SOLN
INTRAVENOUS | Status: DC | PRN
  Administered 2016-07-10: 14:00:00 via INTRAVENOUS

## 2016-07-10 MED ORDER — FENTANYL BOLUS VIA INFUSION
25.0000 ug | INTRAVENOUS | Status: DC | PRN
  Administered 2016-07-10 – 2016-07-15 (×5): 25 ug via INTRAVENOUS
  Filled 2016-07-10: qty 25

## 2016-07-10 MED ORDER — ANTISEPTIC ORAL RINSE SOLUTION (CORINZ)
7.0000 mL | OROMUCOSAL | Status: DC
  Administered 2016-07-10 – 2016-07-21 (×101): 7 mL via OROMUCOSAL

## 2016-07-10 MED ORDER — LACTATED RINGERS IV SOLN
INTRAVENOUS | Status: DC | PRN
  Administered 2016-07-10 (×2): via INTRAVENOUS

## 2016-07-10 MED ORDER — FENTANYL CITRATE (PF) 100 MCG/2ML IJ SOLN: 25.0000 ug | INTRAMUSCULAR | Status: DC | PRN

## 2016-07-10 MED ORDER — CLINDAMYCIN PHOSPHATE 600 MG/50ML IV SOLN
600.0000 mg | INTRAVENOUS | Status: DC
  Filled 2016-07-10: qty 50

## 2016-07-10 MED ORDER — CEFAZOLIN SODIUM-DEXTROSE 2-4 GM/100ML-% IV SOLN
INTRAVENOUS | Status: AC
  Filled 2016-07-10: qty 100

## 2016-07-10 MED ORDER — VANCOMYCIN HCL IN DEXTROSE 1-5 GM/200ML-% IV SOLN
INTRAVENOUS | Status: AC
  Filled 2016-07-10: qty 200

## 2016-07-10 MED ORDER — ONDANSETRON HCL 4 MG/2ML IJ SOLN
4.0000 mg | Freq: Four times a day (QID) | INTRAMUSCULAR | Status: DC | PRN
  Filled 2016-07-10: qty 2

## 2016-07-10 MED ORDER — CHLORHEXIDINE GLUCONATE 0.12% ORAL RINSE (MEDLINE KIT)
15.0000 mL | Freq: Two times a day (BID) | OROMUCOSAL | Status: DC
  Administered 2016-07-10 – 2016-07-21 (×23): 15 mL via OROMUCOSAL

## 2016-07-10 MED ORDER — MIDAZOLAM HCL 5 MG/5ML IJ SOLN
INTRAMUSCULAR | Status: DC | PRN
  Administered 2016-07-10 (×2): 1 mg via INTRAVENOUS

## 2016-07-10 MED ORDER — MIDAZOLAM HCL 2 MG/2ML IJ SOLN
1.0000 mg | INTRAMUSCULAR | Status: DC | PRN
  Administered 2016-07-10 – 2016-07-18 (×17): 1 mg via INTRAVENOUS
  Filled 2016-07-10 (×19): qty 2

## 2016-07-10 MED ORDER — METHYLPREDNISOLONE SODIUM SUCC 125 MG IJ SOLR
INTRAMUSCULAR | Status: DC | PRN
  Administered 2016-07-10: 60 mg via INTRAVENOUS

## 2016-07-10 MED ORDER — ALBUTEROL SULFATE (2.5 MG/3ML) 0.083% IN NEBU: 2.5000 mg | INHALATION_SOLUTION | RESPIRATORY_TRACT | Status: DC | PRN

## 2016-07-10 MED ORDER — FENTANYL CITRATE (PF) 250 MCG/5ML IJ SOLN
INTRAMUSCULAR | Status: DC | PRN
  Administered 2016-07-10: 50 ug via INTRAVENOUS

## 2016-07-10 MED ORDER — ROCURONIUM BROMIDE 100 MG/10ML IV SOLN
INTRAVENOUS | Status: DC | PRN
  Administered 2016-07-10: 50 mg via INTRAVENOUS

## 2016-07-10 MED ORDER — FENTANYL CITRATE (PF) 100 MCG/2ML IJ SOLN
50.0000 ug | Freq: Once | INTRAMUSCULAR | Status: AC
  Administered 2016-07-10: 50 ug via INTRAVENOUS

## 2016-07-10 MED ORDER — PROPOFOL 10 MG/ML IV BOLUS
INTRAVENOUS | Status: DC | PRN
  Administered 2016-07-10: 50 mg via INTRAVENOUS

## 2016-07-10 MED ORDER — EPINEPHRINE HCL 0.1 MG/ML IJ SOSY
PREFILLED_SYRINGE | INTRAMUSCULAR | Status: AC
  Filled 2016-07-10: qty 10

## 2016-07-10 MED ORDER — SODIUM CHLORIDE 0.9 % IV SOLN
25.0000 ug/h | INTRAVENOUS | Status: DC
  Administered 2016-07-10: 150 ug/h via INTRAVENOUS
  Administered 2016-07-11: 300 ug/h via INTRAVENOUS
  Administered 2016-07-11 – 2016-07-12 (×2): 250 ug/h via INTRAVENOUS
  Administered 2016-07-12 – 2016-07-13 (×2): 350 ug/h via INTRAVENOUS
  Administered 2016-07-13: 100 ug/h via INTRAVENOUS
  Administered 2016-07-13: 150 ug/h via INTRAVENOUS
  Administered 2016-07-15: 100 ug/h via INTRAVENOUS
  Administered 2016-07-16 – 2016-07-18 (×5): 200 ug/h via INTRAVENOUS
  Administered 2016-07-19: 50 ug/h via INTRAVENOUS
  Administered 2016-07-21: 75 ug/h via INTRAVENOUS
  Filled 2016-07-10 (×17): qty 50

## 2016-07-10 MED ORDER — DOPAMINE-DEXTROSE 1.6-5 MG/ML-% IV SOLN
INTRAVENOUS | Status: DC | PRN
  Administered 2016-07-10: 3 ug/kg/min via INTRAVENOUS

## 2016-07-10 MED ORDER — DOPAMINE-DEXTROSE 3.2-5 MG/ML-% IV SOLN
INTRAVENOUS | Status: AC
Start: 2016-07-10 — End: 2016-07-10
  Filled 2016-07-10: qty 250

## 2016-07-10 MED ORDER — PROPOFOL 500 MG/50ML IV EMUL
INTRAVENOUS | Status: DC | PRN
  Administered 2016-07-10: 150 ug/kg/min via INTRAVENOUS

## 2016-07-10 MED ORDER — CEFAZOLIN SODIUM-DEXTROSE 2-4 GM/100ML-% IV SOLN
2.0000 g | INTRAVENOUS | Status: DC
  Filled 2016-07-10: qty 100

## 2016-07-10 MED ORDER — VANCOMYCIN HCL 1000 MG IV SOLR
INTRAVENOUS | Status: DC | PRN
  Administered 2016-07-10: 1000 mg via INTRAVENOUS

## 2016-07-10 MED ORDER — SODIUM CHLORIDE 0.45 % IV SOLN: INTRAVENOUS | Status: DC

## 2016-07-10 MED ORDER — LIDOCAINE 2% (20 MG/ML) 5 ML SYRINGE
INTRAMUSCULAR | Status: DC | PRN
  Administered 2016-07-10: 60 mg via INTRAVENOUS

## 2016-07-10 MED ORDER — FAMOTIDINE IN NACL 20-0.9 MG/50ML-% IV SOLN
20.0000 mg | Freq: Two times a day (BID) | INTRAVENOUS | Status: DC
  Administered 2016-07-10 – 2016-07-16 (×13): 20 mg via INTRAVENOUS
  Filled 2016-07-10 (×13): qty 50

## 2016-07-10 SURGICAL SUPPLY — 2 items
BLANKET WARM UNDERBOD FULL ACC (MISCELLANEOUS) ×4 IMPLANT
PACK CARDIAC CATHETERIZATION (CUSTOM PROCEDURE TRAY) IMPLANT

## 2016-07-10 NOTE — Anesthesia Procedure Notes (Signed)
Performed by: Finis Bud

## 2016-07-10 NOTE — CV Procedure (Signed)
TEE: Anesthesia:  Dr Oletta Lamas propofol fentanyl and versed  Note patient was difficult to pass probe. Asked for assistance and Dr Smith Robert kind enough to pass probe Patient had desats during sedation. Probe removed from oropharynx x3 with oral airway and bagging Sats would come back up.    After TEE probe passed and began to image patient became persistently hypotensive TEE showed ? Stunned LV with smoke in LA/LV and EF 20-35% with severe LVH and  Non obstructive HOCM.  There was no significant MR , no effusion and no LVOT gradient.  Tube removed. Patient bagged.  Sats 98% Patient intubated by anesthesia.  Two rounds of epi given NS 500 cc bolus And low dose dopamine started.  After about 10 minutes patients BP returned to normal and she was actually hypertensive TTE on f/u showed persistently stunned ventricle EF 25%. Measurements for Watchman unable To be made  Discussed with Dr Rayann Heman and Dr Oletta Lamas anesthesia.  Will admit to unit Cover for possible esophageal trauma with vancomycin, clindamycin and solumedrol Soft tissue neck CT latter this afternoon Continue fluids and low dose dopamine  Keep intubated for now as anesthesia felt that there was some airway edema and she  Was a difficult intubation.   Currently Paced rhythm 78 with BP 170/98 with 100% sats and stable  Jenkins Rouge

## 2016-07-10 NOTE — Progress Notes (Signed)
  Echocardiogram Echocardiogram Transesophageal has been performed.  Darlina Sicilian M 07/10/2016, 1:40 PM

## 2016-07-10 NOTE — Progress Notes (Signed)
Per discussion with charge nurse, Junie Panning, in foley huddle, it was decided to wait on insertion of foley catheter. Rn's will continue to check pt and bladder scan as needed. Will reassess catheter if needed and continue to monitor.

## 2016-07-10 NOTE — Anesthesia Procedure Notes (Signed)
Procedure Name: Intubation Date/Time: 07/10/2016 1:18 PM Performed by: Mervyn Gay Pre-anesthesia Checklist: Patient identified, Patient being monitored, Timeout performed, Emergency Drugs available and Suction available Patient Re-evaluated:Patient Re-evaluated prior to inductionOxygen Delivery Method: Circle System Utilized Preoxygenation: Pre-oxygenation with 100% oxygen Intubation Type: IV induction Ventilation: Mask ventilation with difficulty, Two handed mask ventilation required and Oral airway inserted - appropriate to patient size Laryngoscope Size: 3 and McGraph Grade View: Grade I Tube type: Oral Tube size: 7.0 mm Number of attempts: 2 Airway Equipment and Method: Stylet Placement Confirmation: ETT inserted through vocal cords under direct vision,  positive ETCO2 and breath sounds checked- equal and bilateral Secured at: 22 cm Tube secured with: Tape Dental Injury: Teeth and Oropharynx as per pre-operative assessment  Difficulty Due To: Difficult Airway- due to large tongue and Difficult Airway-  due to edematous airway Comments: DL after prolonged attempt by Dr. Johnsie Cancel to insert TEE

## 2016-07-10 NOTE — Anesthesia Preprocedure Evaluation (Signed)
Anesthesia Evaluation  Patient identified by MRN, date of birth, ID band Patient awake    Reviewed: Allergy & Precautions, NPO status   Airway Mallampati: II  TM Distance: >3 FB     Dental   Pulmonary asthma , sleep apnea , COPD, former smoker,    breath sounds clear to auscultation       Cardiovascular hypertension, + angina +CHF  + dysrhythmias  Rhythm:Regular Rate:Normal     Neuro/Psych    GI/Hepatic Neg liver ROS, GERD  ,  Endo/Other    Renal/GU Renal disease     Musculoskeletal  (+) Arthritis ,   Abdominal   Peds  Hematology  (+) anemia ,   Anesthesia Other Findings   Reproductive/Obstetrics                             Anesthesia Physical Anesthesia Plan  ASA: III  Anesthesia Plan: General   Post-op Pain Management:    Induction: Intravenous  Airway Management Planned: Oral ETT  Additional Equipment: Arterial line  Intra-op Plan:   Post-operative Plan: Extubation in OR  Informed Consent:   Dental advisory given  Plan Discussed with: CRNA and Anesthesiologist  Anesthesia Plan Comments:         Anesthesia Quick Evaluation

## 2016-07-10 NOTE — Progress Notes (Signed)
Around 2100, pt became very agitated and impulsive, swinging arms and kicking her legs, BP dropped to 99991111 systolic. Bladder scan showed >345 ml, ELink called, order for foley insertion. Once foley was entered 300 ml returned, pt relaxed and BP went back up to previous parameters. Pt is now resting comfortably.  Versed pulled for agitation but not needed. Wasted 2 mg vial in sharps with Beatrix Shipper, RN.

## 2016-07-10 NOTE — Transfer of Care (Signed)
Immediate Anesthesia Transfer of Care Note  Patient: [name]  Procedure(s) Performed: Procedure(s): Transesophageal Echocardiogram Darden Dates)  Patient Location: ICU  Anesthesia Type:General  Level of Consciousness: sedated and Patient remains intubated per anesthesia plan  Airway & Oxygen Therapy: Patient remains intubated per anesthesia plan and Patient placed on Ventilator (see vital sign flow sheet for setting)  Post-op Assessment: Report given to RN and Post -op Vital signs reviewed and stable  Post vital signs: Reviewed and stable  Last Vitals:  Vitals:   07/10/16 1049  BP: 129/87  Pulse: 68  Resp: 18  Temp: 36.8 C    Last Pain: There were no vitals filed for this visit.       Complications: No apparent anesthesia complications

## 2016-07-10 NOTE — Anesthesia Postprocedure Evaluation (Signed)
Anesthesia Post Note  Patient: [name]  Procedure(s) Performed: Procedure(s): Transesophageal Echocardiogram Museum/gallery curator)  Patient location during evaluation: ICU Anesthesia Type: General Level of consciousness: patient remains intubated per anesthesia plan Pain management: pain level controlled Vital Signs Assessment: post-procedure vital signs reviewed and stable Respiratory status: patient remains intubated per anesthesia plan Cardiovascular status: stable Anesthetic complications: no    Last Vitals:  Vitals:   07/10/16 1705 07/10/16 1800  BP:    Pulse:  69  Resp:  16  Temp: 36.5 C     Last Pain:  Vitals:   07/10/16 1705  TempSrc: Oral                 EDWARDS,Azaliah Carrero

## 2016-07-10 NOTE — H&P (Signed)
[name] is a 70 y.o. female who presents today for watchman implantation.  Since last being seen in our clinic, the patient reports doing reasonably well. Her SOB is stable.  She continues to complain of frequent palpitations though she has complete heart block and is 100% V paced.   She has not recently been anticoagulated due to prior GI Bleeding/ AVMs.  Today, she denies symptoms of CP, lower extremity edema, dizziness, presyncope, or syncope.  The patient is otherwise without complaint today.        Past Medical History  Diagnosis Date  . Angioedema     2/2 ACE  . RLS (restless legs syndrome)     Dx 06/2007  . Anemia   . Arteriovenous malformation of stomach   . Permanent atrial fibrillation ([place])   . Renal failure     baseline creatinine 1.6  . Hyperlipidemia   . Depression   . Diastolic heart failure   . Arthritis   . Sleep apnea   . Asthma   . Hx of colonic polyp 08/13/10    adenomatous  . Fatty liver 07/26/10  . Panic attacks   . Hypertension   . Complete heart block ([place])     s/p PPM 1998  . Hypertrophic cardiomyopathy ([place])     dx by Dr [name] 2009  . Hx of colonoscopy   . GI bleed   . Panic attack   . DDD (degenerative disc disease)   . History of alcohol abuse Stopped Fall 2012  . History of tobacco use Quit Fall 2012  . Shortness of breath     sob on exertation  . CHF (congestive heart failure) ([place])   . Angina   . GERD (gastroesophageal reflux disease)   . AVM (arteriovenous malformation) of colon     small intestine; stomach  . Right arm pain 01/08/2012  . Hyperlipidemia   . Blood transfusion   . Hx of cardiovascular stress test     a. Lexiscan Myoview (10/15):  Small inferolateral and apical defect c/w scar and poss soft tissue attenuation, no ischemia, EF 42%  . Iron deficiency anemia          Past Surgical History  Procedure Laterality Date  . Tubal ligation  04/01/2000  . Pacemaker  insertion  [place], most recent gen change by [name] 4/12  . Cardiac catheterization    . Esophagogastroduodenoscopy  12/23/2011    Procedure: ESOPHAGOGASTRODUODENOSCOPY (EGD);  Surgeon: [name], MD;  Location: [facility];  Service: Endoscopy;  Laterality: N/A;  . Givens capsule study  12/23/2011    Procedure: GIVENS CAPSULE STUDY;  Surgeon: [name], MD;  Location: [facility];  Service: Endoscopy;  Laterality: N/A;   medicines reviewed  ROS- all systems are reviewed and negative except as per HPI above  Physical Exam: Vitals:   07/10/16 1049  BP: 129/87  Pulse: 68  Resp: 18  Temp: 98.3 F (36.8 C)     GEN- The patient is well appearing, alert and oriented x 3 today.   Head- normocephalic, atraumatic Eyes-  Sclera clear, conjunctiva pink Ears- hearing intact Oropharynx- clear Lungs- Clear to ausculation bilaterally, normal work of breathing Chest- pacemaker pocket is well healed Heart- Regular rate and rhythm, (paced) GI- soft, NT, ND, + BS Extremities- no clubbing, cyanosis, or edema  Pacemaker interrogation- reviewed in detail today,  See PACEART report  Assessment and Plan:  1. Longstanding persistent afib Not a candidate for long  term anticoagulation due to prior GI bleeding.  I agree with Dr Oval Linsey that Watchman LAA occlusion is reasonable.  The patients chart has been reviewed and I along with Dr Oval Linsey feel that they would be a candidate for short term oral anticoagulation.  Procedural risks for the Watchman implant have been reviewed with the patient including a 1% risk of stroke, 2% risk of perforation, 0.1% risk of device embolization.  Given the patient's poor candidacy for long-term oral anticoagulation, ability to tolerate short term oral anticoagulation, I have recommended the watchman left atrial appendage closure system.  TEE today to evaluate LAA anatomy. If suitable, will proceed with watchman implantation at this  time. Will start asa and coumadin post procedure. Anticipate initiation of amiodarone with goal of sinus rhythm long term.  Thompson Grayer MD, [facility] 07/10/2016 11:58 AM

## 2016-07-10 NOTE — Consult Note (Signed)
PULMONARY / CRITICAL CARE MEDICINE   Name: [name] MRN: [id number] DOB: [birth year]    ADMISSION DATE:  07/10/2016 CONSULTATION DATE:  7/27  REFERRING MD:  Allred   CHIEF COMPLAINT:  Respiratory failure   HISTORY OF PRESENT ILLNESS:   70yo female former smoker, polymyalgia rheumatica, HTN, hypertrophic cardiomyopathy, CKD, AFib not candidate for anticoagulation r/t prior GI bleeding who was admitted 7/27 for elective TEE to evaluate L atrial appendage and place watchman device (left atrial appendage closure system for long term stroke prevention).  Pt was anemic on admit and decision was made to not proceed with watchman device (as it would require asa, coumadin for short time afterwards) but TEE was done to eval function and anatomy.  MD had significant difficulty passing the probe, which was eventually passed by anesthesia.  TEE showed ?stunned LV with severe hypokinesis and EF 20-25% and non obstructive HOCM.  However she had significant hypotension and increasing respiratory distress so she was intubated, started on pressors and tx to ICU post procedure.    PAST MEDICAL HISTORY :  She  has a past medical history of Anemia; Angina; Angioedema; Arteriovenous malformation of stomach; Arthritis; Asthma; AVM (arteriovenous malformation) of colon; Blood transfusion; CHF (congestive heart failure) ([place]); Complete heart block ([place]); DDD (degenerative disc disease); Depression; Diastolic heart failure; Fatty liver (07/26/10); GERD (gastroesophageal reflux disease); GI bleed; History of alcohol abuse (Stopped Fall 2012); History of tobacco use (Quit Fall 2012); cardiovascular stress test; colonic polyp (08/13/10); colonoscopy; Hyperlipidemia; Hyperlipidemia; Hypertension; Hypertrophic cardiomyopathy ([place]); Iron deficiency anemia; Panic attack; Panic attacks; Permanent atrial fibrillation ([place]); Renal failure; Right arm pain (01/08/2012); RLS (restless legs syndrome); Shortness of breath; and Sleep  apnea.  PAST SURGICAL HISTORY: She  has a past surgical history that includes Tubal ligation (04/01/2000); Pacemaker insertion (1998); Cardiac catheterization; Esophagogastroduodenoscopy (12/23/2011); and Givens capsule study (12/23/2011).  Allergies  Allergen Reactions  . Ace Inhibitors Other (See Comments)    Angioedema    No current facility-administered medications on file prior to encounter.    Current Outpatient Prescriptions on File Prior to Encounter  Medication Sig  . acetaminophen (TYLENOL) 325 MG tablet Take 325 mg by mouth daily as needed (pain).  [name] ALPRAZolam (XANAX) 0.5 MG tablet TAKE 1 TABLET BY MOUTH AS NEEDED FOR ANXIETY/ SLEEP  . aspirin EC 81 MG tablet Take 81 mg by mouth daily.   [name] CALCIUM-VITAMIN D PO Take 1 tablet by mouth every 14 (fourteen) days.   . carvedilol (COREG) 12.5 MG tablet TAKE 1 TABLET(12.5 MG) BY MOUTH TWICE DAILY  . Coenzyme Q-10 200 MG CAPS Take 1 capsule by mouth as directed.   [name] esomeprazole (NEXIUM) 40 MG capsule Take 40 mg by mouth daily.   . ferrous sulfate 325 (65 FE) MG tablet TAKE 1 TABLET BY MOUTH TWICE DAILY WITH MEALS TO KEEP BLOOD COUNT UP  . furosemide (LASIX) 20 MG tablet Take 1 tablet by mouth on Tues, Thurs, Sat and Sun and take 2 tablets by mouth on Mon, Wed, Fri  . Multiple Vitamin (MULTIVITAMIN) capsule Take 1 capsule by mouth daily.  [name] PROAIR HFA 108 (90 Base) MCG/ACT inhaler INHALE 2 PUFFS INTO THE LUNGS EVERY 6 HOURS AS NEEDED FOR WHEEZING OR SHORTNESS OF BREATH  . rosuvastatin (CRESTOR) 20 MG tablet TAKE 1 TABLET BY MOUTH DAILY ON MONDAY, WEDNESDAY, AND FRIDAY  . senna (SENOKOT) 8.6 MG tablet Take 1 tablet by mouth daily as needed for constipation.   . sucralfate (CARAFATE) 1 g tablet Take 1  g by mouth 2 (two) times daily.   . nitroGLYCERIN (NITROSTAT) 0.4 MG SL tablet Place 1 tablet (0.4 mg total) under the tongue every 5 (five) minutes as needed for chest pain.    FAMILY HISTORY:  Her indicated that her mother is deceased.  She indicated that her father is deceased.    SOCIAL HISTORY: She  reports that she quit smoking about 4 years ago. Her smoking use included Cigarettes. She has a 5.00 pack-year smoking history. She has never used smokeless tobacco. She reports that she drinks alcohol. She reports that she does not use drugs.  REVIEW OF SYSTEMS:   Unable, pt sedated on vent post cath.   SUBJECTIVE:    VITAL SIGNS: BP (!) 156/86   Pulse 70   Temp 98.3 F (36.8 C)   Resp 16   Ht 5\' 5"  (1.651 m)   Wt 80.7 kg (178 lb)   SpO2 100%   BMI 29.62 kg/m   HEMODYNAMICS:    VENTILATOR SETTINGS: Vent Mode: PRVC FiO2 (%):  [100 %] 100 % Set Rate:  [16 bmp] 16 bmp Vt Set:  [450 mL] 450 mL PEEP:  [5 cmH20] 5 cmH20 Plateau Pressure:  [23 cmH20] 23 cmH20  INTAKE / OUTPUT: No intake/output data recorded.  PHYSICAL EXAMINATION:  General:  wdwn female, NAD  Neuro:  Sedated on vent post cath, RASS -2 HEENT:  Mm moist, no JVD, ETT  Cardiovascular:  s1s2 rrr, paced  Lungs:  resps even, non labored on vent, slightly diminished bases otherwise clear  Abdomen:  Round, soft, +bs  Musculoskeletal:  Warm and dry, no sig edema   LABS:  BMET  Recent Labs Lab 07/10/16 1159  NA 141  K 4.2  CL 108  CO2 26  BUN 25*  CREATININE 1.32*  GLUCOSE 111*    Electrolytes  Recent Labs Lab 07/10/16 1159  CALCIUM 9.5    CBC  Recent Labs Lab 07/10/16 1159  WBC 4.4  HGB 8.5*  HCT 29.3*  PLT 214    Coag's No results for input(s): APTT, INR in the last 168 hours.  Sepsis Markers No results for input(s): LATICACIDVEN, PROCALCITON, O2SATVEN in the last 168 hours.  ABG No results for input(s): PHART, PCO2ART, PO2ART in the last 168 hours.  Liver Enzymes No results for input(s): AST, ALT, ALKPHOS, BILITOT, ALBUMIN in the last 168 hours.  Cardiac Enzymes No results for input(s): TROPONINI, PROBNP in the last 168 hours.  Glucose No results for input(s): GLUCAP in the last 168  hours.  Imaging No results found.   STUDIES:  TEE 7/27>>>  ?stunned LV with severe hypokinesis and EF 20-25% and non obstructive HOCM 2D echo 7/28>>> CT soft tissue neck 7/27>>>  CULTURES:   ANTIBIOTICS:   SIGNIFICANT EVENTS: 7/27 TEE  LINES/TUBES: ETT 7/27>>> R rad aline 7/27>>>  DISCUSSION: 70yo female with persistent AFib unable to be anticoagulated r/t previous GI bleeding and known hypertrophic cardiomyopathy   ASSESSMENT / PLAN:  PULMONARY Acute respiratory failure - in setting TEE with difficulty passing scope and procedural hypotension  Hx asthma  P:   CT soft tissue neck  Vent support - 8cc/kg  F/u CXR  F/u ABG  PRN BD  SBT in am   CARDIOVASCULAR Hypertrophic cardiomyopathy  Stunned LV with significant hypokinesis  AFib - paced - Not candidate for anticoagulation r/t previous GI bleeding  Hypotension/ cardiogenic shock - improved  P:  Trend troponin  F/u echo in am  Cards primary  Pressors as  needed to keep MAP >65 Avoid propofol   RENAL CKD  P:   F/u chem this pm and in am   GASTROINTESTINAL Hx GI bleed - no current s/s bleeding  P:   NPO  PPI  F/u CBC this pm and in am  Hemoccult all stools    HEMATOLOGIC Anemia  P:  F/u CBC  SCD's   INFECTIOUS No active issue  P:   Monitor fever, WBC curve off abx   ENDOCRINE Chronic steroids  Hx polymyalgia rheumatica  P:   Cont maintenance prednisone 5mg /day  Consider stress steroids if ongoing hypotension - hold off for now   NEUROLOGIC Sedation needs on vent  P:   RASS goal: -1 Avoid propofol  Fentanyl gtt  PRN versed  WUA in am   FAMILY  - Updates: daughter updated at length by Dr. Rayann Heman 7/27.   - Inter-disciplinary family meet or Palliative Care meeting due by:  8/3   Nickolas Madrid, NP 07/10/2016  2:39 PM Pager: [phone] or [phone]   ATTENDING NOTE / ATTESTATION NOTE :   I have discussed the case with the resident/APP  [facility].   I  agree with the resident/APP's  history, physical examination, assessment, and plans.    I have edited the above note and modified it according to our agreed history, physical examination, assessment and plan.   I discussed the case with Dr. Rayann Heman as well as the ICU team.   Briefly, patient admitted for elective TEE. Was supposed to have watchman device insertion for chronic atrial fibrillation but was deferred because of anemia. She ended up just having TEE. Patient has chronic atrial fibrillation for which she cannot be on blood thinners because of anemia. There was difficulty inserting the probe during TEE. Patient had oxygen desaturation and hypotension during the procedure. TEE showed stunned myocardium with EF 24%. She ended up being intubated by anesthesia. Difficult intubation. Very swollen vocal cords. Hypotension might be related to propofol. Was started on pressors and was given antibiotics. There was concern about esophageal trauma during TEE. Was transferred to the unit. By the time I saw her, she was actually hypertensive and pressors have been weaned off. Precedex being transitioned to fentanyl drip. O2 saturation 100% on 100% FiO2. V paced rhythm.  Morbidly obese lady. (-) NVD. Comfortable. Diminished breath sounds bilaterally. Trace edema. Warm extremities. Rest of exam unremarkable.   No new labs since the TEE.  A>  1. Acute hypoxemic respiratory failure secondary to  Unable to protect airway  Stunned Myocardium/Demand ischemia  COPD by history but she is not on meds and spirometry did not reveal COPD. I dont think   she has an exacerbation now  Likely untreated OSA  OHS 2. Permanent atrial fibrillation. 3. Complete heart block. 4. Chronic kidney disease, stage IIIB. 5. Recent worsening anemia.    Plan : 1. As above. 2. Continue ventilatory support for now. Plan for ABG and try to cut down FiO2. Anticipate, if neck ct scan is unremarkable, we can try to wean and extubate in  am.  3. Pressors have been weaned off.  4. Suggest holding off on propofol as that might have caused hypotension. Cont Fentanyl.  5. Needs a neck CT scan. If significant for trauma or perforation, we'll need to call surgery and start antibiotics. 6. Check lactate, PCT, panculture.  7. Keep euvolemic. 8. Check cbc tonight and WOF bleeding.   I have spent 35  minutes of critical care time  with this patient today.  Family :No family at bedside.    Monica Becton, MD 07/10/2016, 3:47 PM [facility] Pager [phone] After 3 pm or if no answer, call [phone]

## 2016-07-10 NOTE — H&P (Signed)
CARDIOLOGY ADMIT NOTE    Primary Care Physician: Steve Rattler, DO  Admit Date: 07/10/2016  CC: respiratory failure  [name] is a 70 y.o. female with a h/o longstanding persistent atrial fibrillation, prior GI bleeding, hypertrophic cardiomyopathy, complete heart block s/p PPM, and diastolic dysfunction who is admitted with acute respiratory dysfunction and hypotensive episode.  She presented today initially for consideration of Watchman left atrial appendage implantation.  Due to anemia today, Watchman implantation was cancelled.  I had a long discussion with the patient who was clear that she did want to proceed with screening transesophageal echocardiography to further evaluate her left atrial appendage for possible watchman device implant on a later date once her anemia had been adequately managed.  Risks of TEE were discussed at length with the patient by me.   She underwent sedation by anesthesia with anticipated TEE by Dr Johnsie Cancel.   As outlined in Dr Kyla Balzarine procedure note, TEE probe placement was initially difficult.  Eventually TEE probe was passed and the esophagus was appropriately intubated with the probe.  Unfortunately, she developed respiratory failure at that time with worsening hypoxia and hypotension.  Her EF was observed on TEE to be acutely stunned at 25%.  The TEE procedure was therefore abandoned.  She was urgently intubated by anesthesia and she was placed on IV Pressors.  Endotracheal intubation was challenging due to swelling of the vocal cords/ pharynx.  She was stabilized and subsequently is now admitted to ICU for  Intensive cardiac and respiratory management.  Past Medical History:  Diagnosis Date  . Anemia   . Angina   . Angioedema    2/2 ACE  . Arteriovenous malformation of stomach   . Arthritis   . Asthma   . AVM (arteriovenous malformation) of colon    small intestine; stomach  . Blood transfusion   . CHF (congestive heart failure) ([place])   .  Complete heart block ([place])    s/p PPM 1998  . DDD (degenerative disc disease)   . Depression   . Diastolic heart failure   . Fatty liver 07/26/10  . GERD (gastroesophageal reflux disease)   . GI bleed   . History of alcohol abuse Stopped Fall 2012  . History of tobacco use Quit Fall 2012  . Hx of cardiovascular stress test    a. Lexiscan Myoview (10/15):  Small inferolateral and apical defect c/w scar and poss soft tissue attenuation, no ischemia, EF 42%  . Hx of colonic polyp 08/13/10   adenomatous  . Hx of colonoscopy   . Hyperlipidemia   . Hyperlipidemia   . Hypertension   . Hypertrophic cardiomyopathy ([place])    dx by Dr [name] 2009  . Iron deficiency anemia   . Panic attack   . Panic attacks   . Permanent atrial fibrillation ([place])   . Renal failure    baseline creatinine 1.6  . Right arm pain 01/08/2012  . RLS (restless legs syndrome)    Dx 06/2007  . Shortness of breath    sob on exertation  . Sleep apnea    Past Surgical History:  Procedure Laterality Date  . CARDIAC CATHETERIZATION    . ESOPHAGOGASTRODUODENOSCOPY  12/23/2011   Procedure: ESOPHAGOGASTRODUODENOSCOPY (EGD);  Surgeon: [name], MD;  Location: [facility];  Service: Endoscopy;  Laterality: N/A;  . GIVENS CAPSULE STUDY  12/23/2011   Procedure: GIVENS CAPSULE STUDY;  Surgeon: [name], MD;  Location: [facility];  Service: Endoscopy;  Laterality: N/A;  .  PACEMAKER INSERTION  1998   st jude, most recent gen change by [name] 4/12  . TUBAL LIGATION  04/01/2000       Allergies  Allergen Reactions  . Ace Inhibitors Other (See Comments)    Angioedema    Social History   Social History  . Marital status: Divorced    Spouse name: N/A  . Number of children: 4  . Years of education: 9   Occupational History  . disabled Unemployed  . previously- house cleaning    Social History Main Topics  . Smoking status: Former Smoker    Packs/day: 0.10    Years: 50.00    Types: Cigarettes    Quit date:  08/16/2011  . Smokeless tobacco: Never Used     Comment: quit 2012  . Alcohol use 0.0 oz/week     Comment: quit 08/16/2011  . Drug use: No  . Sexual activity: No   Other Topics Concern  . Not on file   Social History Narrative   On disability for heart failure/pacemaker.     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    4 grown children,  8 grandchildren.     Drinks alcohol a few times a week socially. At one time, the most she reports drinking is about 3 mixed drinks.   Lives with daughter [name] and son. Will be moving 04/2011 to different house because concerned children are taking advantage of her financial situation.    No illicit drugs.   Smokes few cigarettes daily, especially when stressed. Started smoking @ age 39. Quit January 2012 2/2 health but started smoking again 02/2011.       Health Care POA:    Emergency Contact: daughter, Verdene Lennert [phone]   End of Life Plan: gave pt AD info pamphlet   Who lives with you: no one- lives in section 8 housing   Any pets: none   Diet: Pt has a varied diet of protein, starch and vegetables   Exercise: Pt has no regular exercise routine.   Seatbelts: Pt reports wearing seatbelt when in vehicles.    Hobbies: dancing    Family History  Problem Relation Age of Onset  . Hypertension Mother   . Stroke Mother   . Hypertension Father   . Stroke Father   . Hypertension Sister   . Hypertension Brother   . Colon cancer Brother   . Diabetes Sister   . Diabetes Brother   . Cancer Brother   . Heart attack Brother   . Heart attack Father   . Heart disease Mother   . Heart disease Sister   . Alcohol abuse Brother   . Alcohol abuse Father   . Aneurysm Mother   . CVA Mother     ROS- pt unable to provide  Physical Exam: Telemetry: Vitals:   07/10/16 1049  BP: 129/87  Pulse: 68  Resp: 18  Temp: 98.3 F (36.8 C)  SpO2: 98%  Weight: 178 lb (80.7 kg)  Height: 5\' 5"  (1.651 m)    GEN- The patient is ill appearing,  sedated and intubated Head- normocephalic, atraumatic Eyes-  Sclera clear, conjunctiva pale Ears- hearing intact prior to procedure, currently unable to assess Oropharynx- ETT in place Neck- supple, no crepitus Lungs- Clear to ausculation anteriorly Heart- Regular rate and rhythm (paced) GI- soft  Extremities- no clubbing, cyanosis, or edema MS- no significant deformity or atrophy Skin- no rash or lesion Psych- sedated Neuro- sedated  Labs:   Lab Results  Component Value Date   WBC 4.4 07/10/2016   HGB 8.5 (L) 07/10/2016   HCT 29.3 (L) 07/10/2016   MCV 88.0 07/10/2016   PLT 214 07/10/2016    Recent Labs Lab 07/10/16 1159  NA 141  K 4.2  CL 108  CO2 26  BUN 25*  CREATININE 1.32*  CALCIUM 9.5  GLUCOSE 111*   Lab Results  Component Value Date   CKTOTAL 58 04/05/2015   CKMB 2.7 08/27/2012   TROPONINI 0.39 (H) 02/06/2016    Lab Results  Component Value Date   CHOL 122 03/28/2015   CHOL 150 03/30/2013   CHOL 234 (H) 02/21/2013   Lab Results  Component Value Date   HDL 42 (L) 03/28/2015   HDL 32.80 (L) 03/30/2013   HDL 37.50 (L) 02/21/2013   Lab Results  Component Value Date   LDLCALC 63 03/28/2015   LDLCALC 97 03/30/2013   LDLCALC 75 05/04/2012   Lab Results  Component Value Date   TRIG 86 03/28/2015   TRIG 102.0 03/30/2013   TRIG 195.0 (H) 02/21/2013   Lab Results  Component Value Date   CHOLHDL 2.9 03/28/2015   CHOLHDL 5 03/30/2013   CHOLHDL 6 02/21/2013   Lab Results  Component Value Date   LDLDIRECT 161.8 02/21/2013   LDLDIRECT 94 09/10/2011   LDLDIRECT 140 (H) 03/28/2011      Echo: 2016 reviewed, TEE as above  ASSESSMENT AND PLAN:    1. Acute respiratory failure/ hypotension The patient has developed acute respiratory failure during TEE procedure today.  She has been stabilized by anesthesia at this time.  She is quite ill and at risk for decompensation.  I will admit to ICU for close management.  I have spoken with critical care  team who will assit with ventilator management.  I am optimistic that she can perhaps be extubated within 24 hours or so.  Given difficulty with TEE intubation, I have ordered CT scan of neck (soft tissues) to evaluate for esophageal injury.  Will keep intubated until her airway and respiratory status is clearly stabilized. I suspect that her hypotension and acutely reduced EF may be a result of propofol.  We will wean neo and dopamine drips as BP allows.  Repeat echo in am. EKG would only reveal AF with V pacing and therefore is not currently helpful in excluding cardiac ischemia.  I would anticipate that cardiac markers would be elevated if checked at this time. I would currently favor conservative management and weaning pressors overnight.  Once more stable and extubated, we will assess for any ischemic symptoms.    2. Permanent afib Unable to anticoagulate in setting of acute anemia.  Watchman procedure was cancelled for today.  Given difficulty with anesthesia, I would be very reluctant to consider watchman in the future.  3. Complete heart block Pacing appropriately  4. Chronic renal failure Stable  5. Anemia Recent worsening of hb noted.  Avoid anticoagulation.  Will need outpatient workup by GI (previously known to Dr [name]).  The patient is critically ill with multiple organ systems failure and requires high complexity decision making for assessment and support, frequent evaluation and titration of therapies, application of advanced monitoring technologies and extensive interpretation of multiple databases.   Total CCT is 90 minutes.  Thompson Grayer, MD 07/10/2016  2:12 PM

## 2016-07-11 ENCOUNTER — Inpatient Hospital Stay (HOSPITAL_COMMUNITY): Payer: Medicare Other

## 2016-07-11 DIAGNOSIS — I421 Obstructive hypertrophic cardiomyopathy: Secondary | ICD-10-CM

## 2016-07-11 DIAGNOSIS — J9690 Respiratory failure, unspecified, unspecified whether with hypoxia or hypercapnia: Secondary | ICD-10-CM | POA: Diagnosis present

## 2016-07-11 DIAGNOSIS — I482 Chronic atrial fibrillation: Secondary | ICD-10-CM

## 2016-07-11 LAB — CBC
HEMATOCRIT: 29 % — AB (ref 36.0–46.0)
Hemoglobin: 8.7 g/dL — ABNORMAL LOW (ref 12.0–15.0)
MCH: 26.2 pg (ref 26.0–34.0)
MCHC: 30 g/dL (ref 30.0–36.0)
MCV: 87.3 fL (ref 78.0–100.0)
PLATELETS: 194 10*3/uL (ref 150–400)
RBC: 3.32 MIL/uL — ABNORMAL LOW (ref 3.87–5.11)
RDW: 15 % (ref 11.5–15.5)
WBC: 7.7 10*3/uL (ref 4.0–10.5)

## 2016-07-11 LAB — BLOOD GAS, ARTERIAL
Acid-base deficit: 1 mmol/L (ref 0.0–2.0)
BICARBONATE: 24.6 meq/L — AB (ref 20.0–24.0)
Drawn by: 244871
FIO2: 0.4
LHR: 16 {breaths}/min
O2 Saturation: 98.9 %
PEEP: 5 cmH2O
Patient temperature: 98.6
TCO2: 26.1 mmol/L (ref 0–100)
VT: 450 mL
pCO2 arterial: 51.3 mmHg — ABNORMAL HIGH (ref 35.0–45.0)
pH, Arterial: 7.302 — ABNORMAL LOW (ref 7.350–7.450)
pO2, Arterial: 156 mmHg — ABNORMAL HIGH (ref 80.0–100.0)

## 2016-07-11 LAB — ECHOCARDIOGRAM COMPLETE
CHL CUP TV REG PEAK VELOCITY: 343 cm/s
E/e' ratio: 14.81
EWDT: 169 ms
FS: 31 % (ref 28–44)
HEIGHTINCHES: 65 in
IVS/LV PW RATIO, ED: 0.97
LA ID, A-P, ES: 56 mm
LA diam index: 2.98 cm/m2
LA vol index: 43.1 mL/m2
LAVOL: 81.1 mL
LAVOLA4C: 74.9 mL
LEFT ATRIUM END SYS DIAM: 56 mm
LV e' LATERAL: 6.16 cm/s
LVEEAVG: 14.81
LVEEMED: 14.81
LVOT area: 3.8 cm2
LVOT diameter: 22 mm
Lateral S' vel: 9 cm/s
MV Dec: 169
MV Peak grad: 3 mmHg
MVPKAVEL: 43.2 m/s
MVPKEVEL: 91.2 m/s
PW: 13.6 mm — AB (ref 0.6–1.1)
RV sys press: 57 mmHg
TAPSE: 9.23 mm
TDI e' lateral: 6.16
TDI e' medial: 4.95
TRMAXVEL: 343 cm/s
WEIGHTICAEL: 2848 [oz_av]

## 2016-07-11 LAB — BASIC METABOLIC PANEL
ANION GAP: 7 (ref 5–15)
BUN: 24 mg/dL — AB (ref 6–20)
CALCIUM: 9.4 mg/dL (ref 8.9–10.3)
CHLORIDE: 106 mmol/L (ref 101–111)
CO2: 27 mmol/L (ref 22–32)
CREATININE: 1.45 mg/dL — AB (ref 0.44–1.00)
GFR, EST AFRICAN AMERICAN: 41 mL/min — AB (ref >60)
GFR, EST NON AFRICAN AMERICAN: 35 mL/min — AB (ref >60)
Glucose, Bld: 149 mg/dL — ABNORMAL HIGH (ref 65–99)
POTASSIUM: 4.9 mmol/L (ref 3.5–5.1)
SODIUM: 140 mmol/L (ref 135–145)

## 2016-07-11 LAB — GLUCOSE, CAPILLARY
GLUCOSE-CAPILLARY: 121 mg/dL — AB (ref 65–99)
Glucose-Capillary: 150 mg/dL — ABNORMAL HIGH (ref 65–99)
Glucose-Capillary: 168 mg/dL — ABNORMAL HIGH (ref 65–99)
Glucose-Capillary: 197 mg/dL — ABNORMAL HIGH (ref 65–99)

## 2016-07-11 LAB — MAGNESIUM
MAGNESIUM: 2.3 mg/dL (ref 1.7–2.4)
MAGNESIUM: 2.2 mg/dL (ref 1.7–2.4)

## 2016-07-11 LAB — PHOSPHORUS
PHOSPHORUS: 5 mg/dL — AB (ref 2.5–4.6)
Phosphorus: 5.1 mg/dL — ABNORMAL HIGH (ref 2.5–4.6)

## 2016-07-11 MED ORDER — VITAL HIGH PROTEIN PO LIQD
1000.0000 mL | ORAL | Status: DC
  Administered 2016-07-11 – 2016-07-16 (×7): 1000 mL

## 2016-07-11 MED ORDER — PRO-STAT SUGAR FREE PO LIQD
30.0000 mL | Freq: Two times a day (BID) | ORAL | Status: DC
  Administered 2016-07-11: 30 mL
  Filled 2016-07-11: qty 30

## 2016-07-11 MED ORDER — DEXAMETHASONE SODIUM PHOSPHATE 4 MG/ML IJ SOLN
4.0000 mg | Freq: Four times a day (QID) | INTRAMUSCULAR | Status: AC
  Administered 2016-07-11 – 2016-07-13 (×8): 4 mg via INTRAVENOUS
  Filled 2016-07-11 (×8): qty 1

## 2016-07-11 MED ORDER — VITAL HIGH PROTEIN PO LIQD: 1000.0000 mL | ORAL | Status: DC

## 2016-07-11 NOTE — Progress Notes (Signed)
Utilization review completed.  

## 2016-07-11 NOTE — Progress Notes (Signed)
PULMONARY / CRITICAL CARE MEDICINE   Name: [name] MRN: [id number] DOB: 1946-11-17    ADMISSION DATE:  07/10/2016 CONSULTATION DATE:  7/27  REFERRING MD:  Allred   CHIEF COMPLAINT:  Respiratory failure   HISTORY OF PRESENT ILLNESS:   70yo female former smoker, polymyalgia rheumatica, HTN, hypertrophic cardiomyopathy, CKD, AFib not candidate for anticoagulation r/t prior GI bleeding who was admitted 7/27 for elective TEE to evaluate L atrial appendage and place watchman device (left atrial appendage closure system for long term stroke prevention).  Pt was anemic on admit and decision was made to not proceed with watchman device (as it would require asa, coumadin for short time afterwards) but TEE was done to eval function and anatomy.  MD had significant difficulty passing the probe, which was eventually passed by anesthesia.  TEE showed ?stunned LV with severe hypokinesis and EF 20-25% and non obstructive HOCM.  However she had significant hypotension and increasing respiratory distress so she was intubated, started on pressors and tx to ICU post procedure.     SUBJECTIVE:  No issues overnight. Comfortable but gets anxious. On PST > doing fair.   VITAL SIGNS: BP (!) 93/59   Pulse 75   Temp 97.7 F (36.5 C) (Oral)   Resp 16   Ht 5\' 5"  (1.651 m)   Wt 80.7 kg (178 lb)   SpO2 100%   BMI 29.62 kg/m   HEMODYNAMICS:    VENTILATOR SETTINGS: Vent Mode: PRVC FiO2 (%):  [40 %-100 %] 40 % Set Rate:  [16 bmp] 16 bmp Vt Set:  [450 mL] 450 mL PEEP:  [5 cmH20] 5 cmH20 Plateau Pressure:  [21 cmH20-27 cmH20] 21 cmH20  INTAKE / OUTPUT: I/O last 3 completed shifts: In: 1650.3 [I.V.:1550.3; IV Piggyback:100] Out: 460 [Urine:440; Blood:20]  PHYSICAL EXAMINATION:  General:  wdwn female, NAD comfortable Neuro:  CN grossly intact. (-) lateralizing signs noted HEENT:  Mm moist, no JVD, ETT  Cardiovascular:  s1s2 rrr, paced  Lungs:  resps even, non labored on vent, slightly  diminished bases otherwise clear  Abdomen:  Round, soft, +bs  Musculoskeletal:  Warm and dry, Gr 1  edema   LABS:  BMET  Recent Labs Lab 07/10/16 1159 07/10/16 1943 07/11/16 0400  NA 141 139 140  K 4.2 4.7 4.9  CL 108 108 106  CO2 26 25 27   BUN 25* 24* 24*  CREATININE 1.32* 1.28* 1.45*  GLUCOSE 111* 158* 149*    Electrolytes  Recent Labs Lab 07/10/16 1159 07/10/16 1943 07/11/16 0400  CALCIUM 9.5 9.4 9.4    CBC  Recent Labs Lab 07/10/16 1159 07/10/16 1943 07/11/16 0400  WBC 4.4 6.4 7.7  HGB 8.5* 8.8* 8.7*  HCT 29.3* 29.1* 29.0*  PLT 214 193 194    Coag's No results for input(s): APTT, INR in the last 168 hours.  Sepsis Markers  Recent Labs Lab 07/10/16 1703  LATICACIDVEN 1.1    ABG  Recent Labs Lab 07/10/16 1702 07/11/16 0350  PHART 7.318* 7.302*  PCO2ART 56.2* 51.3*  PO2ART 496.0* 156*    Liver Enzymes No results for input(s): AST, ALT, ALKPHOS, BILITOT, ALBUMIN in the last 168 hours.  Cardiac Enzymes No results for input(s): TROPONINI, PROBNP in the last 168 hours.  Glucose No results for input(s): GLUCAP in the last 168 hours.  Imaging Ct Head Wo Contrast  Result Date: 07/10/2016 CLINICAL DATA:  Altered mental status. Transesophageal echocardiogram today with difficult intubation and hypotension EXAM: CT HEAD WITHOUT CONTRAST TECHNIQUE: Contiguous axial  images were obtained from the base of the skull through the vertex without intravenous contrast. COMPARISON:  CT head 10/05/2011 FINDINGS: Ventricle size normal.  Cerebral volume normal. Negative for acute infarct. Negative for hemorrhage or mass. No fluid collection or shift of the midline structures. Mucosal edema throughout the paranasal sinuses. Negative calvarium IMPRESSION: No acute intracranial abnormality. If there is concern of anoxic injury, follow-up CT may be helpful as ischemia may be missed on early CT. Electronically Signed   By: Franchot Gallo M.D.   On: 07/10/2016  15:42  Ct Soft Tissue Neck Wo Contrast  Result Date: 07/10/2016 CLINICAL DATA:  Transesophageal echo cardiogram today with difficult intubation. Hypotension. Rule out esophageal injury. EXAM: CT NECK WITHOUT CONTRAST TECHNIQUE: Multidetector CT imaging of the neck was performed following the standard protocol without intravenous contrast. COMPARISON:  None. FINDINGS: Pharynx and larynx: The patient is intubated. Pharyngeal soft tissues are collapsed around endotracheal tube and there may be some pharyngeal edema. Endotracheal tube in good position in the trachea above the carina. No focal hematoma. No soft tissue gas is seen that would suggests pharyngeal or esophageal perforation. No mass-effect or shift of the midline structures. Salivary glands: Parotid and submandibular glands normal bilaterally. Thyroid: Negative Lymph nodes: Negative for cervical lymphadenopathy. Vascular: Patency of vascular structures not assessed without intravenous contrast. Limited intracranial: Negative Visualized orbits: Negative Mastoids and visualized paranasal sinuses: Extensive mucosal edema throughout the paranasal sinuses bilaterally with near complete opacification of the maxillary sinus bilaterally Skeleton: Mild degenerative changes in the cervical spine. No acute skeletal abnormality. Dental disease. Upper chest: Mild atelectasis in the right upper lobe.  No effusion. IMPRESSION: Endotracheal tube in good position. The pharynx is collapsed around endotracheal tube and there may be some pharyngeal edema. No hematoma. No gas in the soft tissues of the pharynx or proximal esophagus that would suggest acute injury. Negative for mass or adenopathy Extensive mucosal edema paranasal sinuses Right upper lobe atelectasis. Electronically Signed   By: Franchot Gallo M.D.   On: 07/10/2016 15:40  Dg Chest Port 1 View  Result Date: 07/11/2016 CLINICAL DATA:  Respiratory failure. EXAM: PORTABLE CHEST 1 VIEW COMPARISON:  07/10/2016.  FINDINGS: Endotracheal tube, NG tube in stable position. Cardiac pacer in stable position. Stable cardiomegaly. Low lung volumes with mild bibasilar subsegmental atelectasis and or infiltrates/edema. No pleural effusion or pneumothorax. IMPRESSION: 1.  Lines and tubes in stable position. 2. Low lung volumes with mild bibasilar atelectasis and/or infiltrates/edema. 3. Cardiac pacer stable position.  Stable cardiomegaly. Electronically Signed   By: Marcello Moores  Register   On: 07/11/2016 06:59  Dg Chest Portable 1 View  Result Date: 07/10/2016 CLINICAL DATA:  Endotracheal tube and OG tube placements. EXAM: PORTABLE CHEST 1 VIEW COMPARISON:  Chest x-ray dated 02/04/2016. FINDINGS: Endotracheal tube is well positioned with tip approximately 3 cm above the carina. Enteric tube passes below the diaphragm. Cardiomediastinal silhouette is stable in size and configuration. Mild interstitial edema again noted bilaterally. No pleural effusion or pneumothorax seen. Right chest wall pacemaker in place. Osseous and soft tissue structures about the chest otherwise unremarkable. IMPRESSION: Endotracheal tube well positioned with tip approximately 3 cm above the carina. OG tube passes below the diaphragm. Distal tip of the OG tube is below the lower limits of this chest x-ray. Cardiomegaly and interstitial edema suggesting mild CHF/volume overload. Electronically Signed   By: Franki Cabot M.D.   On: 07/10/2016 16:05    STUDIES:  TEE 7/27>>>  ?stunned LV with severe hypokinesis and  EF 20-25% and non obstructive HOCM 2D echo 7/28>>> CT soft tissue neck 7/27>>>  CULTURES: MRSA 7/27 (-) Trache asp 7/27 > Blood 7/27 >>  ANTIBIOTICS: None  SIGNIFICANT EVENTS: 7/27 TEE. Went into resp distress and hypotension during TEE. Admit 2H  LINES/TUBES: ETT 7/27>>> R rad aline 7/27>>>  DISCUSSION: 70yo female with persistent AFib unable to be anticoagulated r/t previous GI bleeding and known hypertrophic cardiomyopathy  admitted after hypotension and resp fx during TEE  ASSESSMENT / PLAN:  PULMONARY  Acute hypoxemic hypercapneic respiratory failure secondary to  Unable to protect airway. Swollen airways after TEE. Has a size 7 ET.   Stunned Myocardium/Demand ischemia  COPD by history but she is not on meds and spirometry did not reveal COPD. I dont think   she has an exacerbation now  Likely untreated OSA  [facility] P:   CT soft tissue neck was (-) for infection but has pharyngeal/airway edema > will give decadron for the next 24-48 hrs and anticipate we can try weaning in 2-3 days. Pt with very difficult airway and has a size 7ETT. Will probably let her rest over th weekend.  Vent support - 8cc/kg    CARDIOVASCULAR Stunned LV with significant hypokinesis EF 20-25%. S/P TEE 7/27 Chroni AFib - paced - Not candidate for anticoagulation r/t previous GI bleeding  complete heart block  P:  Cardiology following No anticoagulation 2/2 GI bleed    RENAL CKD st IIIB P:   Keep euvolemic   GASTROINTESTINAL Hx GI bleed - no current s/s bleeding  P:   PPI  Hb and Hct have been stable Start TF  HEMATOLOGIC Anemia  P:  F/u Hb andf Hct  INFECTIOUS No active issue  P:   Monitor fever, WBC curve off abx   ENDOCRINE Chronic steroids (on 5 mg of pred daily) Hx polymyalgia rheumatica  P:   Start decadron for airway edema Cont maintenance prednisone 5mg /day  Consider stress steroids if ongoing hypotension - hold off for now   NEUROLOGIC Sedation needs on vent  P:   RASS goal: -1 Avoid propofol as it caused BP to drop during TEE.  Fentanyl gtt  PRN versed   FAMILY  - Updates: daughter updated at length by Dr. Rayann Heman 7/27. No family at bedside today. I tried calling pt's daughter Verdene Lennert > no answer.   - Inter-disciplinary family meet or Palliative Care meeting due by:  8/3  Critical care time spent on this pt today : 35 minutes.    Monica Becton, MD 07/11/2016, 8:35 AM [place]  [facility] Pager [phone] After 3 pm or if no answer, call [phone]

## 2016-07-11 NOTE — Care Management Important Message (Signed)
Important Message  Patient Details  Name: [name] MRN: [id number] Date of Birth: 11/24/1946   Medicare Important Message Given:  Yes    Loann Quill 07/11/2016, 10:28 AM

## 2016-07-11 NOTE — Progress Notes (Signed)
  Echocardiogram 2D Echocardiogram has been performed.  Johny Chess 07/11/2016, 9:47 AM

## 2016-07-11 NOTE — Progress Notes (Signed)
SUBJECTIVE: The patient is intubated, combative last night per nursing.   CURRENT MEDICATIONS: . antiseptic oral rinse  7 mL Mouth Rinse 10 times per day  . chlorhexidine gluconate (SAGE KIT)  15 mL Mouth Rinse BID  . famotidine (PEPCID) IV  20 mg Intravenous Q12H  . predniSONE  5 mg Per Tube Q breakfast   . fentaNYL infusion INTRAVENOUS 250 mcg/hr (07/11/16 0109)    OBJECTIVE: Physical Exam: Vitals:   07/11/16 0400 07/11/16 0500 07/11/16 0600 07/11/16 0714  BP: (!) 120/101  (!) 93/59   Pulse: 70 70 70 75  Resp:    16  Temp:  97 F (36.1 C)    TempSrc:  Axillary    SpO2: 98% 100% 100% 100%  Weight:      Height:        Intake/Output Summary (Last 24 hours) at 07/11/16 0725 Last data filed at 07/11/16 0600  Gross per 24 hour  Intake          1650.33 ml  Output              460 ml  Net          1190.33 ml    Telemetry reveals atrial fibrillation with V pacing, occasional PVC's   GEN- The patient is intubated, will open eyes to voice and follow commands Head- normocephalic, atraumatic Eyes-  Sclera clear, conjunctiva pink Ears- hearing intact Oropharynx- +ETT Neck- supple  Lungs- Clear to ausculation bilaterally, normal work of breathing Heart- Regular rate and rhythm (paced) GI- soft, NT, ND, + BS Extremities- no clubbing, cyanosis, or edema Skin- no rash or lesion    LABS: Basic Metabolic Panel:  Recent Labs  07/10/16 1943 07/11/16 0400  NA 139 140  K 4.7 4.9  CL 108 106  CO2 25 27  GLUCOSE 158* 149*  BUN 24* 24*  CREATININE 1.28* 1.45*  CALCIUM 9.4 9.4   CBC:  Recent Labs  07/10/16 1943 07/11/16 0400  WBC 6.4 7.7  HGB 8.8* 8.7*  HCT 29.1* 29.0*  MCV 87.1 87.3  PLT 193 194    RADIOLOGY: Ct Head Wo Contrast Result Date: 07/10/2016 CLINICAL DATA:  Altered mental status. Transesophageal echocardiogram today with difficult intubation and hypotension EXAM: CT HEAD WITHOUT CONTRAST TECHNIQUE: Contiguous axial images were obtained from the  base of the skull through the vertex without intravenous contrast. COMPARISON:  CT head 10/05/2011 FINDINGS: Ventricle size normal.  Cerebral volume normal. Negative for acute infarct. Negative for hemorrhage or mass. No fluid collection or shift of the midline structures. Mucosal edema throughout the paranasal sinuses. Negative calvarium IMPRESSION: No acute intracranial abnormality. If there is concern of anoxic injury, follow-up CT may be helpful as ischemia may be missed on early CT. Electronically Signed   By: Franchot Gallo M.D.   On: 07/10/2016 15:42  Ct Soft Tissue Neck Wo Contrast Result Date: 07/10/2016 CLINICAL DATA:  Transesophageal echo cardiogram today with difficult intubation. Hypotension. Rule out esophageal injury. EXAM: CT NECK WITHOUT CONTRAST TECHNIQUE: Multidetector CT imaging of the neck was performed following the standard protocol without intravenous contrast. COMPARISON:  None. FINDINGS: Pharynx and larynx: The patient is intubated. Pharyngeal soft tissues are collapsed around endotracheal tube and there may be some pharyngeal edema. Endotracheal tube in good position in the trachea above the carina. No focal hematoma. No soft tissue gas is seen that would suggests pharyngeal or esophageal perforation. No mass-effect or shift of the midline structures. Salivary glands: Parotid and submandibular glands  normal bilaterally. Thyroid: Negative Lymph nodes: Negative for cervical lymphadenopathy. Vascular: Patency of vascular structures not assessed without intravenous contrast. Limited intracranial: Negative Visualized orbits: Negative Mastoids and visualized paranasal sinuses: Extensive mucosal edema throughout the paranasal sinuses bilaterally with near complete opacification of the maxillary sinus bilaterally Skeleton: Mild degenerative changes in the cervical spine. No acute skeletal abnormality. Dental disease. Upper chest: Mild atelectasis in the right upper lobe.  No effusion.  IMPRESSION: Endotracheal tube in good position. The pharynx is collapsed around endotracheal tube and there may be some pharyngeal edema. No hematoma. No gas in the soft tissues of the pharynx or proximal esophagus that would suggest acute injury. Negative for mass or adenopathy Extensive mucosal edema paranasal sinuses Right upper lobe atelectasis. Electronically Signed   By: Franchot Gallo M.D.   On: 07/10/2016 15:40  Dg Chest Port 1 View Result Date: 07/11/2016 CLINICAL DATA:  Respiratory failure. EXAM: PORTABLE CHEST 1 VIEW COMPARISON:  07/10/2016. FINDINGS: Endotracheal tube, NG tube in stable position. Cardiac pacer in stable position. Stable cardiomegaly. Low lung volumes with mild bibasilar subsegmental atelectasis and or infiltrates/edema. No pleural effusion or pneumothorax. IMPRESSION: 1.  Lines and tubes in stable position. 2. Low lung volumes with mild bibasilar atelectasis and/or infiltrates/edema. 3. Cardiac pacer stable position.  Stable cardiomegaly. Electronically Signed   By: [place]   On: 07/11/2016 06:59   ASSESSMENT AND PLAN:  Active Problems:   Chronic atrial fibrillation (HCC)   Complete heart block (HCC)   Persistent atrial fibrillation (HCC)   Acute respiratory failure with hypoxia (HCC)   Hypotension due to drugs   Demand ischemia ([place])  1.  Acute respiratory failure/hypotension BP stable overnight Vent management per CCM - hopefully can try to extubate today  2.  Permanent atrial fibrillation No OAC 2/2 anemia With difficulty with sedation and intubation during TEE, she is at increased risk for procedures going forward, would be hesitant to pursue watchman in the future  3.  Complete heart block S/p PPM Will have device interrogated this morning  4.  Chronic renal failure Repeat BMET tomorrow  5.  Anemia Stable today   Chanetta Marshall, NP 07/11/2016 7:34 AM   I have seen, examined the patient, and reviewed the above assessment and plan. Intubated  but opens eyes to voice and follows commands.  Changes to above are made where necessary.  Hopefully, extubated today.  Echo today.  If extubated, could transfer to telemetry and possibly home tomorrow.  General cardiology to see this weekend. Anticipate discharge to home on Saturday if she wakes up and feels well after extubation.  Co Sign: Thompson Grayer, MD 07/11/2016 7:51 AM

## 2016-07-11 NOTE — Care Management Note (Signed)
Case Management Note  Patient Details  Name: [name] MRN: [id number] Date of Birth: 1946-08-17  Subjective/Objective:  Pt admitted on 07/10/16 after developing hypoxia during TEE and requiring intubation.  PTA, pt resided at home with daughter.                   Action/Plan: Pt currently remains intubated.  Will follow for discharge planning as pt progresses.    Expected Discharge Date:                  Expected Discharge Plan:  [place]  In-House Referral:     Discharge planning Services  CM Consult  Post Acute Care Choice:    Choice offered to:     DME Arranged:    DME Agency:     HH Arranged:    [place] Agency:     Status of Service:  In process, will continue to follow  If discussed at Long Length of Stay Meetings, dates discussed:    Additional Comments:  Ella Bodo, RN 07/11/2016, 1:28 PM

## 2016-07-11 NOTE — Progress Notes (Signed)
Initial Nutrition Assessment  INTERVENTION:   Start enteral nutrition therapy: Vital High Protein @ 55 ml/hr  Provides: 1320 ml, 1320 kcal, 115 grams protein, and 1103 ml H2O.   NUTRITION DIAGNOSIS:   Inadequate oral intake related to inability to eat as evidenced by NPO status.  GOAL:   Patient will meet greater than or equal to 90% of their needs  MONITOR:   TF tolerance, I & O's, Labs, Vent status  REASON FOR ASSESSMENT:   Consult Enteral/tube feeding initiation and management  ASSESSMENT:   70yo female with persistent AFib unable to be anticoagulated r/t previous GI bleeding and known hypertrophic cardiomyopathy admitted after hypotension and resp fx during TEE   Patient is currently intubated on ventilator support MV: 5.7 L/min Temp (24hrs), Avg:97.6 F (36.4 C), Min:96.9 F (36.1 C), Max:98.1 F (36.7 C)   Medications reviewed and include: decadron, prednisone Labs reviewed: PO4: 5.1 Nutrition-Focused physical exam completed. Findings are no fat depletion, no muscle depletion, and mild edema.   Discussed with RN.    Diet Order:  Diet NPO time specified  Skin:  Reviewed, no issues  Last BM:  unknown  Height:   Ht Readings from Last 1 Encounters:  07/10/16 5\' 5"  (1.651 m)    Weight:   Wt Readings from Last 1 Encounters:  07/10/16 178 lb (80.7 kg)    Ideal Body Weight:  56.8 kg  BMI:  Body mass index is 29.62 kg/m.  Estimated Nutritional Needs:   Kcal:  [id number]  Protein:  100-120 grams  Fluid:  > 1.5 L/day  EDUCATION NEEDS:   No education needs identified at this time  [place], [place], [place] Pager [phone] After Hours Pager

## 2016-07-12 DIAGNOSIS — R1310 Dysphagia, unspecified: Secondary | ICD-10-CM

## 2016-07-12 DIAGNOSIS — Z978 Presence of other specified devices: Secondary | ICD-10-CM

## 2016-07-12 DIAGNOSIS — J9600 Acute respiratory failure, unspecified whether with hypoxia or hypercapnia: Secondary | ICD-10-CM

## 2016-07-12 DIAGNOSIS — I5021 Acute systolic (congestive) heart failure: Secondary | ICD-10-CM

## 2016-07-12 DIAGNOSIS — R400 Somnolence: Secondary | ICD-10-CM

## 2016-07-12 DIAGNOSIS — K229 Disease of esophagus, unspecified: Secondary | ICD-10-CM

## 2016-07-12 DIAGNOSIS — Z789 Other specified health status: Secondary | ICD-10-CM

## 2016-07-12 DIAGNOSIS — R1319 Other dysphagia: Secondary | ICD-10-CM

## 2016-07-12 LAB — PHOSPHORUS
PHOSPHORUS: 3.2 mg/dL (ref 2.5–4.6)
Phosphorus: 4.1 mg/dL (ref 2.5–4.6)

## 2016-07-12 LAB — BASIC METABOLIC PANEL
Anion gap: 5 (ref 5–15)
BUN: 46 mg/dL — ABNORMAL HIGH (ref 6–20)
CHLORIDE: 109 mmol/L (ref 101–111)
CO2: 25 mmol/L (ref 22–32)
CREATININE: 1.76 mg/dL — AB (ref 0.44–1.00)
Calcium: 9.3 mg/dL (ref 8.9–10.3)
GFR, EST AFRICAN AMERICAN: 32 mL/min — AB (ref >60)
GFR, EST NON AFRICAN AMERICAN: 28 mL/min — AB (ref >60)
Glucose, Bld: 214 mg/dL — ABNORMAL HIGH (ref 65–99)
Potassium: 5.3 mmol/L — ABNORMAL HIGH (ref 3.5–5.1)
SODIUM: 139 mmol/L (ref 135–145)

## 2016-07-12 LAB — GLUCOSE, CAPILLARY
GLUCOSE-CAPILLARY: 178 mg/dL — AB (ref 65–99)
GLUCOSE-CAPILLARY: 178 mg/dL — AB (ref 65–99)
Glucose-Capillary: 225 mg/dL — ABNORMAL HIGH (ref 65–99)
Glucose-Capillary: 205 mg/dL — ABNORMAL HIGH (ref 65–99)
Glucose-Capillary: 160 mg/dL — ABNORMAL HIGH (ref 65–99)
Glucose-Capillary: 170 mg/dL — ABNORMAL HIGH (ref 65–99)

## 2016-07-12 LAB — MAGNESIUM
Magnesium: 2.4 mg/dL (ref 1.7–2.4)
Magnesium: 2.6 mg/dL — ABNORMAL HIGH (ref 1.7–2.4)

## 2016-07-12 MED ORDER — INSULIN ASPART 100 UNIT/ML ~~LOC~~ SOLN
0.0000 [IU] | SUBCUTANEOUS | Status: DC
  Administered 2016-07-12 (×3): 3 [IU] via SUBCUTANEOUS
  Administered 2016-07-12: 5 [IU] via SUBCUTANEOUS
  Administered 2016-07-12 – 2016-07-13 (×5): 3 [IU] via SUBCUTANEOUS
  Administered 2016-07-13: 5 [IU] via SUBCUTANEOUS
  Administered 2016-07-14: 3 [IU] via SUBCUTANEOUS
  Administered 2016-07-14: 2 [IU] via SUBCUTANEOUS
  Administered 2016-07-14 – 2016-07-15 (×5): 3 [IU] via SUBCUTANEOUS
  Administered 2016-07-15: 2 [IU] via SUBCUTANEOUS
  Administered 2016-07-15 (×2): 5 [IU] via SUBCUTANEOUS
  Administered 2016-07-15 (×2): 2 [IU] via SUBCUTANEOUS
  Administered 2016-07-16 (×4): 5 [IU] via SUBCUTANEOUS
  Administered 2016-07-16: 3 [IU] via SUBCUTANEOUS
  Administered 2016-07-16 – 2016-07-18 (×8): 5 [IU] via SUBCUTANEOUS
  Administered 2016-07-18 (×2): 3 [IU] via SUBCUTANEOUS
  Administered 2016-07-18: 8 [IU] via SUBCUTANEOUS
  Administered 2016-07-18: 5 [IU] via SUBCUTANEOUS
  Administered 2016-07-18: 2 [IU] via SUBCUTANEOUS
  Administered 2016-07-19: 3 [IU] via SUBCUTANEOUS
  Administered 2016-07-19: 2 [IU] via SUBCUTANEOUS
  Administered 2016-07-19 (×2): 5 [IU] via SUBCUTANEOUS
  Administered 2016-07-19: 3 [IU] via SUBCUTANEOUS
  Administered 2016-07-19 – 2016-07-20 (×3): 2 [IU] via SUBCUTANEOUS
  Administered 2016-07-20 (×2): 3 [IU] via SUBCUTANEOUS
  Administered 2016-07-20: 8 [IU] via SUBCUTANEOUS
  Administered 2016-07-21: 2 [IU] via SUBCUTANEOUS
  Administered 2016-07-21 (×2): 3 [IU] via SUBCUTANEOUS

## 2016-07-12 MED ORDER — SODIUM CHLORIDE 0.9 % IV SOLN
INTRAVENOUS | Status: DC
  Administered 2016-07-12: 08:00:00 via INTRAVENOUS

## 2016-07-12 MED ORDER — HYDRALAZINE HCL 20 MG/ML IJ SOLN
10.0000 mg | INTRAMUSCULAR | Status: DC | PRN
  Administered 2016-07-12 – 2016-07-18 (×8): 10 mg via INTRAVENOUS
  Filled 2016-07-12 (×8): qty 1

## 2016-07-12 NOTE — Progress Notes (Addendum)
ID:  70yo female former smoker, polymyalgia rheumatica, HTN, hypertrophic cardiomyopathy, CKD, chronic AFib not candidate for anticoagulation r/t prior GI bleeding who was admitted 7/27 for elective TEE to evaluate L atrial appendage and place watchman device (left atrial appendage closure system for long term stroke prevention).  Difficult to pass TEE probe which was eventually passed by anesthesia.  TEE showed ?stunned LV with severe hypokinesis and EF 20-25% and non obstructive HOCM.  However she had significant hypotension and increasing respiratory distress so she was intubated, started on pressors and tx to ICU post procedure.  Felt to have angioedema. F/u echo yesterday EF 60%    SUBJECTIVE:  Remains intubated. Now weaning but c/b apneic episode.  Awake now. Not following commands. BP up.   CURRENT MEDICATIONS: . antiseptic oral rinse  7 mL Mouth Rinse 10 times per day  . chlorhexidine gluconate (SAGE KIT)  15 mL Mouth Rinse BID  . dexamethasone  4 mg Intravenous Q6H  . famotidine (PEPCID) IV  20 mg Intravenous Q12H  . insulin aspart  0-15 Units Subcutaneous Q4H  . predniSONE  5 mg Per Tube Q breakfast   . sodium chloride 40 mL/hr at 07/12/16 0749  . feeding supplement (VITAL HIGH PROTEIN) 1,000 mL (07/11/16 1800)  . fentaNYL infusion INTRAVENOUS 275 mcg/hr (07/12/16 0753)    OBJECTIVE: Physical Exam: Vitals:   07/12/16 0400 07/12/16 0500 07/12/16 0600 07/12/16 0812  BP: (!) 152/85  (!) 145/92 (!) 171/92  Pulse: 75 75 75 76  Resp:    18  Temp:    97.8 F (36.6 C)  TempSrc:    Axillary  SpO2: 100% 100% 100% 100%  Weight:      Height:        Intake/Output Summary (Last 24 hours) at 07/12/16 0842 Last data filed at 07/12/16 0600  Gross per 24 hour  Intake          1515.62 ml  Output              575 ml  Net           940.62 ml    Telemetry reveals atrial fibrillation with V pacingn in 70s, occasional PVC's   GEN- The patient is intubated, awake and eyes open but  won't follow commands. Not moving R arm HEENT: normal Oropharynx- +ETT Neck- supple  Lungs- Clear to ausculation bilaterally, normal work of breathing Heart- Regular rate and rhythm (paced) no murmur GI- soft, NT, ND, + BS Extremities- no clubbing, cyanosis, or edema Neuro: awake but not following commands. Moves left arm spontaneously but not R. Doesn't withdraw to pain  Skin- no rash or lesion    LABS: Basic Metabolic Panel:  Recent Labs  07/11/16 0400  07/11/16 1647 07/12/16 0533 07/12/16 0750  NA 140  --   --   --  139  K 4.9  --   --   --  5.3*  CL 106  --   --   --  109  CO2 27  --   --   --  25  GLUCOSE 149*  --   --   --  214*  BUN 24*  --   --   --  46*  CREATININE 1.45*  --   --   --  1.76*  CALCIUM 9.4  --   --   --  9.3  MG  --   < > 2.2 2.4  --   PHOS  --   < >  5.0* 4.1  --   < > = values in this interval not displayed. CBC:  Recent Labs  07/10/16 1943 07/11/16 0400  WBC 6.4 7.7  HGB 8.8* 8.7*  HCT 29.1* 29.0*  MCV 87.1 87.3  PLT 193 194    RADIOLOGY: Ct Head Wo Contrast Result Date: 07/10/2016 CLINICAL DATA:  Altered mental status. Transesophageal echocardiogram today with difficult intubation and hypotension EXAM: CT HEAD WITHOUT CONTRAST TECHNIQUE: Contiguous axial images were obtained from the base of the skull through the vertex without intravenous contrast. COMPARISON:  CT head 10/05/2011 FINDINGS: Ventricle size normal.  Cerebral volume normal. Negative for acute infarct. Negative for hemorrhage or mass. No fluid collection or shift of the midline structures. Mucosal edema throughout the paranasal sinuses. Negative calvarium IMPRESSION: No acute intracranial abnormality. If there is concern of anoxic injury, follow-up CT may be helpful as ischemia may be missed on early CT. Electronically Signed   By: Franchot Gallo M.D.   On: 07/10/2016 15:42  Ct Soft Tissue Neck Wo Contrast Result Date: 07/10/2016 CLINICAL DATA:  Transesophageal echo cardiogram  today with difficult intubation. Hypotension. Rule out esophageal injury. EXAM: CT NECK WITHOUT CONTRAST TECHNIQUE: Multidetector CT imaging of the neck was performed following the standard protocol without intravenous contrast. COMPARISON:  None. FINDINGS: Pharynx and larynx: The patient is intubated. Pharyngeal soft tissues are collapsed around endotracheal tube and there may be some pharyngeal edema. Endotracheal tube in good position in the trachea above the carina. No focal hematoma. No soft tissue gas is seen that would suggests pharyngeal or esophageal perforation. No mass-effect or shift of the midline structures. Salivary glands: Parotid and submandibular glands normal bilaterally. Thyroid: Negative Lymph nodes: Negative for cervical lymphadenopathy. Vascular: Patency of vascular structures not assessed without intravenous contrast. Limited intracranial: Negative Visualized orbits: Negative Mastoids and visualized paranasal sinuses: Extensive mucosal edema throughout the paranasal sinuses bilaterally with near complete opacification of the maxillary sinus bilaterally Skeleton: Mild degenerative changes in the cervical spine. No acute skeletal abnormality. Dental disease. Upper chest: Mild atelectasis in the right upper lobe.  No effusion. IMPRESSION: Endotracheal tube in good position. The pharynx is collapsed around endotracheal tube and there may be some pharyngeal edema. No hematoma. No gas in the soft tissues of the pharynx or proximal esophagus that would suggest acute injury. Negative for mass or adenopathy Extensive mucosal edema paranasal sinuses Right upper lobe atelectasis. Electronically Signed   By: Franchot Gallo M.D.   On: 07/10/2016 15:40  Dg Chest Port 1 View Result Date: 07/11/2016 CLINICAL DATA:  Respiratory failure. EXAM: PORTABLE CHEST 1 VIEW COMPARISON:  07/10/2016. FINDINGS: Endotracheal tube, NG tube in stable position. Cardiac pacer in stable position. Stable cardiomegaly. Low lung  volumes with mild bibasilar subsegmental atelectasis and or infiltrates/edema. No pleural effusion or pneumothorax. IMPRESSION: 1.  Lines and tubes in stable position. 2. Low lung volumes with mild bibasilar atelectasis and/or infiltrates/edema. 3. Cardiac pacer stable position.  Stable cardiomegaly. Electronically Signed   By: [place]   On: 07/11/2016 06:59   ASSESSMENT AND PLAN:  Active Problems:   Chronic atrial fibrillation (HCC)   Complete heart block (HCC)   Persistent atrial fibrillation (HCC)   Acute respiratory failure with hypoxia (HCC)   Hypotension due to drugs   Demand ischemia (HCC)   Respiratory failure ([place])  1.  Acute respiratory failure/hypotension/angioedema Vent management per CCM - hopefully can try to extubate today 2.  Permanent atrial fibrillation Rate is controlled No OAC 2/2 anemia Watchman procedure  deferred for now due to anemia and procedural issues 3. Acute systolic HF Echos reviewed personally. EF during TEE 20-25% with acute biventricular stunning. EF on TTE 60% with no RWMA. Trop 0.3-0.4 Myoview 12/16 - no significant ischemia 4.  Complete heart block S/p PPM Will have device interrogated this morning 5.  Acute on Chronic renal failure, stage III-IV (baseline creatinine ~1.5) Creatinine slightly higher today. Will follow. Hydrate as needed.  6.  Anemia Hgb has been stable. No CBC today.  7. Neuro Patient not following commands on moving R arm spontaneously. Will wean sedation and reassess. May need repeat head CT. (CT 7/27 negative) 8. HTN BP elevated but patient currently undergoing vent wean. Can use prn hydralaine to keep SBP < 170.   Remains intubated. CCM working on extubation. Remains stable from AF perspective. Etiology of LV stunning unclear. No clear evidence of ischemia currently or on recent Myoview. Would manage medically for now unless develops objective signs of ischemia. Watch neuro status and BP. May need repeat head CT.    Critical Care Time devoted to patient care services described in this note is 35 Minutes.   [name] Oliveria,MD 8:47 AM

## 2016-07-12 NOTE — Progress Notes (Addendum)
PULMONARY / CRITICAL CARE MEDICINE   Name: [name] MRN: [id number] DOB: 03-31-1946    ADMISSION DATE:  07/10/2016 CONSULTATION DATE:  7/27  REFERRING MD:  Allred   CHIEF COMPLAINT:  Respiratory failure   HISTORY OF PRESENT ILLNESS:   70yo female former smoker, polymyalgia rheumatica, HTN, hypertrophic cardiomyopathy, CKD, AFib not candidate for anticoagulation r/t prior GI bleeding who was admitted 7/27 for elective TEE to evaluate L atrial appendage and place watchman device (left atrial appendage closure system for long term stroke prevention).  Pt was anemic on admit and decision was made to not proceed with watchman device (as it would require asa, coumadin for short time afterwards) but TEE was done to eval function and anatomy.  MD had significant difficulty passing the probe, which was eventually passed by anesthesia.  TEE showed ?stunned LV with severe hypokinesis and EF 20-25% and non obstructive HOCM.  However she had significant hypotension and increasing respiratory distress so she was intubated, started on pressors and tx to ICU post procedure.     SUBJECTIVE:  RN reports pt was agitated overnight, received 3 mg total versed in last 24 hours, on 254mcg fentanyl.  Hypertensive, CBG's rising.    VITAL SIGNS: BP (!) 145/92   Pulse 75   Temp 98.9 F (37.2 C) (Axillary)   Resp 17   Ht 5\' 5"  (1.651 m)   Wt 187 lb 9.8 oz (85.1 kg)   SpO2 100%   BMI 31.22 kg/m   HEMODYNAMICS:    VENTILATOR SETTINGS: Vent Mode: PRVC FiO2 (%):  [40 %] 40 % Set Rate:  [16 bmp] 16 bmp Vt Set:  [450 mL] 450 mL PEEP:  [5 cmH20] 5 cmH20 Pressure Support:  [10 cmH20] 10 cmH20 Plateau Pressure:  [17 cmH20-20 cmH20] 20 cmH20  INTAKE / OUTPUT: I/O last 3 completed shifts: In: 1903.1 [I.V.:974.9; NG/GT:778.3; IV Piggyback:150] Out: [id number] [Urine:1040]  PHYSICAL EXAMINATION:  General:  wdwn female, NAD on vent Neuro:  Sedate on vent, opens eyes to name, moves spontaneously but not  consistent with following commands / sedate HEENT:  MM moist, no JVD, ETT  Cardiovascular:  s1s2 rrr, paced  Lungs:  resps even, non labored on vent, slightly diminished bases otherwise clear  Abdomen:  Round, soft, +bs  Musculoskeletal:  Warm and dry, +1  Generalized edema   LABS:  BMET  Recent Labs Lab 07/10/16 1159 07/10/16 1943 07/11/16 0400  NA 141 139 140  K 4.2 4.7 4.9  CL 108 108 106  CO2 26 25 27   BUN 25* 24* 24*  CREATININE 1.32* 1.28* 1.45*  GLUCOSE 111* 158* 149*    Electrolytes  Recent Labs Lab 07/10/16 1159 07/10/16 1943 07/11/16 0400 07/11/16 1045 07/11/16 1647 07/12/16 0533  CALCIUM 9.5 9.4 9.4  --   --   --   MG  --   --   --  2.3 2.2 2.4  PHOS  --   --   --  5.1* 5.0* 4.1    CBC  Recent Labs Lab 07/10/16 1159 07/10/16 1943 07/11/16 0400  WBC 4.4 6.4 7.7  HGB 8.5* 8.8* 8.7*  HCT 29.3* 29.1* 29.0*  PLT 214 193 194    Coag's No results for input(s): APTT, INR in the last 168 hours.  Sepsis Markers  Recent Labs Lab 07/10/16 1703  LATICACIDVEN 1.1    ABG  Recent Labs Lab 07/10/16 1702 07/11/16 0350  PHART 7.318* 7.302*  PCO2ART 56.2* 51.3*  PO2ART 496.0* 156*    Liver  Enzymes No results for input(s): AST, ALT, ALKPHOS, BILITOT, ALBUMIN in the last 168 hours.  Cardiac Enzymes No results for input(s): TROPONINI, PROBNP in the last 168 hours.  Glucose  Recent Labs Lab 07/11/16 1206 07/11/16 1742 07/11/16 2038 07/11/16 2330 07/12/16 0357  GLUCAP 121* 150* 168* 197* 225*    Imaging No results found.   STUDIES:  TEE 7/27 >>  ?stunned LV with severe hypokinesis and EF 20-25% and non obstructive HOCM 2D echo 7/28 >> LVEF 55-60%, moderate asymmetric septal hypertrophy, intermittent diastolic dysfunction (AF), no RWMA, PA peak 62  CT soft tissue neck 7/27 >> pharynx is collapsed around ETT, ? Pharyngeal edema, no hematoma, no gas in soft tissue, extensive mucosal edema in paranasal sinuses, RUL atelectasis CT Head  7/27 >> no acute abnormality  CULTURES: MRSA 7/27 (-) Trache asp 7/27 >> Blood 7/27 >>  ANTIBIOTICS: None  SIGNIFICANT EVENTS: 7/27 TEE. Went into resp distress and hypotension during TEE. Admit 2H  LINES/TUBES: ETT 7/27 >> R rad aline 7/27 >>  DISCUSSION: 70yo female with persistent AFib unable to be anticoagulated r/t previous GI bleeding and known hypertrophic cardiomyopathy admitted after hypotension and resp fx during TEE  ASSESSMENT / PLAN:  PULMONARY Acute hypoxemic hypercapneic respiratory failure secondary to inability to protect airway. Swollen airways after TEE. Has a size 7 ET, Stunned Myocardium/Demand ischemia COPD - by history but she is not on meds and spirometry did not reveal COPD. Doubt she has an exacerbation now Likely untreated OSA / OHS Elevated PA Pressures on ECHO P:   CT soft tissue neck was (-) for infection but has pharyngeal/airway edema > decadron for airway swelling Will need cuff leak assessment prior to extubation Pt with very difficult airway and has a size 7ETT.  Vent support - 8cc/kg  Wean PEEP / FiO2 for sats > 92% Ok for PSV weaning as tolerated, no extubation 7/29   CARDIOVASCULAR Stunned LV with significant hypokinesis EF 20-25%. S/P TEE 7/27 Chronic AFib - paced, not candidate for anticoagulation r/t previous GI bleeding  Complete heart block  P:  Cardiology following No anticoagulation 2/2 GI bleed  Tele monitoring D/C aline  RENAL CKD IIIB P:   Keep euvolemic  Trend BMP / UOP  NS @ 50ml/hr, consider d/c in am pending UOP review  GASTROINTESTINAL Hx GI bleed - no current s/s bleeding  P:   PPI  Monitor Hgb TF per nutrition  HEMATOLOGIC Anemia  P:  Trend CBC SCD's for DVT prophylaxis   INFECTIOUS No active issue  P:   Monitor fever, WBC curve off abx   ENDOCRINE Chronic steroids (on 5 mg of pred daily) Hx polymyalgia rheumatica  P:   Decadron for airway edema Cont maintenance prednisone 5mg /day  Add  SSI while on decadron  NEUROLOGIC Sedation needs on vent  P:   RASS goal: -1 Avoid propofol as it caused BP to drop during TEE.  Fentanyl gtt for pain PRN versed for sedation  Daily WUA / SBT  FAMILY  - Updates: daughter updated at length by Dr. Rayann Heman 7/27. No family at bedside 7/29  - Inter-disciplinary family meet or Palliative Care meeting due by:  8/3  Noe Gens, NP-C [facility] Pgr: [phone] or if no answer [phone] 07/12/2016, 7:43 AM  STAFF NOTE: I, Merrie Roof, MD FACP have personally reviewed patient's available data, including medical history, events of note, physical examination and test results as part of my evaluation. I have discussed with resident/NP and other  care providers such as pharmacist, RN and RRT. In addition, I personally evaluated patient and elicited key findings of: lethargic, sedation help, pcxr mild int changes, weaning now cpap 5ps5, goal 1 hr, leak test done, has leak, need abg given acidosis on full MV day prior, will have glide and bronch available if we choose to extubate, need improved neuro status priro to extubation attempt, dc fent, no further benzo, steroids, dc pred while on decadron, NPO I updated family I perfomed leak testing myself Update: leak is weak, minimal , I think we are 24 hours away still to extubation, may consider propofol if needed The patient is critically ill with multiple organ systems failure and requires high complexity decision making for assessment and support, frequent evaluation and titration of therapies, application of advanced monitoring technologies and extensive interpretation of multiple databases.   Critical Care Time devoted to patient care services described in this note is 30 Minutes. This time reflects time of care of this signee: Merrie Roof, MD FACP. This critical care time does not reflect procedure time, or teaching time or supervisory time of PA/NP/Med student/Med  Resident etc but could involve care discussion time. Rest per NP/medical resident whose note is outlined above and that I agree with   Lavon Paganini. Titus Mould, MD, [place] Pgr: [facility] 07/12/2016 10:13 AM

## 2016-07-13 ENCOUNTER — Inpatient Hospital Stay (HOSPITAL_COMMUNITY): Payer: Medicare Other

## 2016-07-13 DIAGNOSIS — G9340 Encephalopathy, unspecified: Secondary | ICD-10-CM

## 2016-07-13 DIAGNOSIS — R569 Unspecified convulsions: Secondary | ICD-10-CM

## 2016-07-13 DIAGNOSIS — R410 Disorientation, unspecified: Secondary | ICD-10-CM

## 2016-07-13 LAB — CBC
HEMATOCRIT: 30.3 % — AB (ref 36.0–46.0)
Hemoglobin: 8.6 g/dL — ABNORMAL LOW (ref 12.0–15.0)
MCH: 25.4 pg — AB (ref 26.0–34.0)
MCHC: 28.4 g/dL — ABNORMAL LOW (ref 30.0–36.0)
MCV: 89.6 fL (ref 78.0–100.0)
Platelets: 200 10*3/uL (ref 150–400)
RBC: 3.38 MIL/uL — AB (ref 3.87–5.11)
RDW: 15.5 % (ref 11.5–15.5)
WBC: 8.5 10*3/uL (ref 4.0–10.5)

## 2016-07-13 LAB — GLUCOSE, CAPILLARY
GLUCOSE-CAPILLARY: 181 mg/dL — AB (ref 65–99)
GLUCOSE-CAPILLARY: 182 mg/dL — AB (ref 65–99)
Glucose-Capillary: 205 mg/dL — ABNORMAL HIGH (ref 65–99)
Glucose-Capillary: 190 mg/dL — ABNORMAL HIGH (ref 65–99)
Glucose-Capillary: 160 mg/dL — ABNORMAL HIGH (ref 65–99)

## 2016-07-13 LAB — BASIC METABOLIC PANEL
Anion gap: 5 (ref 5–15)
BUN: 47 mg/dL — AB (ref 6–20)
CHLORIDE: 109 mmol/L (ref 101–111)
CO2: 26 mmol/L (ref 22–32)
Calcium: 9.5 mg/dL (ref 8.9–10.3)
Creatinine, Ser: 1.38 mg/dL — ABNORMAL HIGH (ref 0.44–1.00)
GFR calc Af Amer: 43 mL/min — ABNORMAL LOW (ref >60)
GFR calc non Af Amer: 37 mL/min — ABNORMAL LOW (ref >60)
Glucose, Bld: 194 mg/dL — ABNORMAL HIGH (ref 65–99)
POTASSIUM: 5.4 mmol/L — AB (ref 3.5–5.1)
SODIUM: 140 mmol/L (ref 135–145)

## 2016-07-13 MED ORDER — LORAZEPAM 2 MG/ML IJ SOLN
INTRAMUSCULAR | Status: AC
  Filled 2016-07-13: qty 2

## 2016-07-13 MED ORDER — LORAZEPAM 2 MG/ML IJ SOLN
4.0000 mg | Freq: Once | INTRAMUSCULAR | Status: AC
  Administered 2016-07-13: 4 mg via INTRAVENOUS

## 2016-07-13 MED ORDER — SODIUM CHLORIDE 0.9 % IV SOLN
2.0000 mg/h | INTRAVENOUS | Status: DC
  Administered 2016-07-13 – 2016-07-14 (×2): 2 mg/h via INTRAVENOUS
  Filled 2016-07-13 (×2): qty 10

## 2016-07-13 MED ORDER — AMLODIPINE BESYLATE 5 MG PO TABS
5.0000 mg | ORAL_TABLET | Freq: Every day | ORAL | Status: DC
  Administered 2016-07-13 – 2016-07-15 (×3): 5 mg via ORAL
  Filled 2016-07-13 (×3): qty 1

## 2016-07-13 MED ORDER — DEXMEDETOMIDINE HCL IN NACL 200 MCG/50ML IV SOLN
0.0000 ug/kg/h | INTRAVENOUS | Status: DC
  Filled 2016-07-13: qty 50

## 2016-07-13 MED ORDER — SODIUM CHLORIDE 0.9 % IV SOLN
500.0000 mg | Freq: Two times a day (BID) | INTRAVENOUS | Status: DC
  Administered 2016-07-13 – 2016-07-14 (×2): 500 mg via INTRAVENOUS
  Filled 2016-07-13 (×3): qty 5

## 2016-07-13 MED ORDER — SODIUM CHLORIDE 0.9 % IV SOLN
1000.0000 mg | Freq: Once | INTRAVENOUS | Status: AC
  Administered 2016-07-13: 1000 mg via INTRAVENOUS
  Filled 2016-07-13: qty 10

## 2016-07-13 MED ORDER — FUROSEMIDE 10 MG/ML IJ SOLN
80.0000 mg | Freq: Once | INTRAMUSCULAR | Status: AC
  Administered 2016-07-13: 80 mg via INTRAVENOUS
  Filled 2016-07-13: qty 8

## 2016-07-13 NOTE — CV Procedure (Signed)
History: [name] is a 70 year old patient with a history of events suspicious for possible seizure activity.  Sedation: Fentanyl and Versed.  Technique: This is a 21 channel routine scalp EEG performed at the bedside with bipolar and monopolar montages arranged in accordance to the international 10/20 system of electrode placement. One channel was dedicated to EKG recording.    Background: The background consists of intermixed alpha and beta activities. There is a well defined posterior dominant rhythm of 4 Hz that attenuates with eye opening. Sleep is recorded with normal appearing structures.   Photic stimulation: Physiologic driving is not tested  EEG Abnormalities: This is an abnormal EEG due to the presence of moderate to severe posterior background slowing.   Clinical Interpretation: This was an abnormal EEG due to the presence of moderate to severe posterior background slowing. The presence of slowing is a nonspecific finding and is indicative of moderate to severe encephalopathy. No epileptiform discharges were observed.   Dr. Gerda Diss. Tasia Catchings, MD Neurohospitalist

## 2016-07-13 NOTE — Consult Note (Signed)
Initial Neurological Consultation                      NEURO HOSPITALIST CONSULT NOTE   Requestig physician: Dr. Titus Mould    Reason for Consult:  Possible seizures   HPI:                                                                                                                                          [name] is an 70 y.o. female who has been admitted for placement of a watchman device. Unfortunately her hospital course was complicated by difficulties with a transesophageal echocardiogram followed by intubation. The TEE revealed possible stunned left ventricle with severe hypokinesis and an ejection fraction of 20-25%.  The concern at this point is that [facility] experience some abnormal movements that were felt to be highly suspicious for seizure activity. Versed and fentanyl have been administered. A loading dose of Keppra has been administered as well. EEG has been requested.    Past Medical History:  Diagnosis Date  . Anemia   . Angina   . Angioedema    2/2 ACE  . Arteriovenous malformation of stomach   . Arthritis   . Asthma   . AVM (arteriovenous malformation) of colon    small intestine; stomach  . Blood transfusion   . CHF (congestive heart failure) ([place])   . Complete heart block ([place])    s/p PPM 1998  . DDD (degenerative disc disease)   . Depression   . Diastolic heart failure   . Fatty liver 07/26/10  . GERD (gastroesophageal reflux disease)   . GI bleed   . History of alcohol abuse Stopped Fall 2012  . History of tobacco use Quit Fall 2012  . Hx of cardiovascular stress test    a. Lexiscan Myoview (10/15):  Small inferolateral and apical defect c/w scar and poss soft tissue attenuation, no ischemia, EF 42%  . Hx of colonic polyp 08/13/10   adenomatous  . Hx of colonoscopy   . Hyperlipidemia   . Hyperlipidemia   . Hypertension   . Hypertrophic cardiomyopathy ([place])    dx by Dr [name] 2009  . Iron deficiency anemia   . Panic attack   .  Panic attacks   . Permanent atrial fibrillation ([place])   . Renal failure    baseline creatinine 1.6  . Right arm pain 01/08/2012  . RLS (restless legs syndrome)    Dx 06/2007  . Shortness of breath    sob on exertation  . Sleep apnea     Past Surgical History:  Procedure Laterality Date  . CARDIAC CATHETERIZATION    . ESOPHAGOGASTRODUODENOSCOPY  12/23/2011   Procedure: ESOPHAGOGASTRODUODENOSCOPY (EGD);  Surgeon: [name], MD;  Location: [facility];  Service: Endoscopy;  Laterality: N/A;  . GIVENS CAPSULE STUDY  12/23/2011   Procedure: GIVENS CAPSULE STUDY;  Surgeon:  [name], MD;  Location: [facility];  Service: Endoscopy;  Laterality: N/A;  . PACEMAKER INSERTION  1998   st jude, most recent gen change by [name] 4/12  . TUBAL LIGATION  04/01/2000    MEDICATIONS:                                                                                                                     I have reviewed the patient's current medications.  Allergies  Allergen Reactions  . Ace Inhibitors Other (See Comments)    Angioedema     Social History:  reports that she quit smoking about 4 years ago. Her smoking use included Cigarettes. She has a 5.00 pack-year smoking history. She has never used smokeless tobacco. She reports that she drinks alcohol. She reports that she does not use drugs.  Family History  Problem Relation Age of Onset  . Hypertension Mother   . Stroke Mother   . Hypertension Father   . Stroke Father   . Hypertension Sister   . Hypertension Brother   . Colon cancer Brother   . Diabetes Sister   . Diabetes Brother   . Cancer Brother   . Heart attack Brother   . Heart attack Father   . Heart disease Mother   . Heart disease Sister   . Alcohol abuse Brother   . Alcohol abuse Father   . Aneurysm Mother   . CVA Mother      ROS:                                                                                                                                        History obtained from chart review  General ROS: negative for - chills, fatigue, fever, night sweats, weight gain or weight loss Psychological ROS: negative for - behavioral disorder, hallucinations, memory difficulties, mood swings or suicidal ideation Ophthalmic ROS: negative for - blurry vision, double vision, eye pain or loss of vision ENT ROS: negative for - epistaxis, nasal discharge, oral lesions, sore throat, tinnitus or vertigo Allergy and Immunology ROS: negative for - hives or itchy/watery eyes Hematological and Lymphatic ROS: negative for - bleeding problems, bruising or swollen lymph nodes Endocrine ROS: negative for - galactorrhea, hair pattern changes, polydipsia/polyuria or temperature intolerance Respiratory ROS: negative for - cough, hemoptysis, shortness of breath or wheezing Cardiovascular ROS: negative for - chest pain, dyspnea on exertion,  edema or irregular heartbeat Gastrointestinal ROS: negative for - abdominal pain, diarrhea, hematemesis, nausea/vomiting or stool incontinence Genito-Urinary ROS: negative for - dysuria, hematuria, incontinence or urinary frequency/urgency Musculoskeletal ROS: negative for - joint swelling or muscular weakness Neurological ROS: as noted in HPI Dermatological ROS: negative for rash and skin lesion changes   General Exam                                                                                                      Blood pressure (!) 190/103, pulse 74, temperature 97.4 F (36.3 C), temperature source Oral, resp. rate 19, height 5\' 5"  (1.651 m), weight 84.9 kg (187 lb 2.7 oz), SpO2 100 %. HEENT-  Normocephalic, no lesions, without obvious abnormality.  Normal external eye and conjunctiva.  Normal TM's bilaterally.  Normal auditory canals and external ears. Normal external nose, mucus membranes and septum.  Normal pharynx. Cardiovascular- regular rate and rhythm, S1, S2 normal, no murmur, click, rub or gallop, pulses palpable  throughout   Lungs- chest clear, no wheezing, rales, normal symmetric air entry, Heart exam - S1, S2 normal, no murmur, no gallop, rate regular Abdomen- soft, non-tender; bowel sounds normal; no masses,  no organomegaly Extremities- less then 2 second capillary refill Lymph-no adenopathy palpable Musculoskeletal-no joint tenderness, deformity or swelling Skin-warm and dry, no hyperpigmentation, vitiligo, or suspicious lesions  Neurological Examination Mental Status: [name] just received sedation with Versed and fentanyl. She is noted to have some spontaneous eye opening and closing with sluggish movements of her eyes. She does not follow any commands at this time. There is no movement of the extremities. Cranial Nerves: Intubated, pupils are sluggish, roving extraocular movements are noted. [name] opens and closes her eyes. There is no facial asymmetry.     Lab Results: Basic Metabolic Panel:  Recent Labs Lab 07/10/16 1159 07/10/16 1943 07/11/16 0400 07/11/16 1045 07/11/16 1647 07/12/16 0533 07/12/16 0750 07/12/16 1830 07/13/16 0349  NA 141 139 140  --   --   --  139  --  140  K 4.2 4.7 4.9  --   --   --  5.3*  --  5.4*  CL 108 108 106  --   --   --  109  --  109  CO2 26 25 27   --   --   --  25  --  26  GLUCOSE 111* 158* 149*  --   --   --  214*  --  194*  BUN 25* 24* 24*  --   --   --  46*  --  47*  CREATININE 1.32* 1.28* 1.45*  --   --   --  1.76*  --  1.38*  CALCIUM 9.5 9.4 9.4  --   --   --  9.3  --  9.5  MG  --   --   --  2.3 2.2 2.4  --  2.6*  --   PHOS  --   --   --  5.1* 5.0* 4.1  --  3.2  --     Liver  Function Tests: No results for input(s): AST, ALT, ALKPHOS, BILITOT, PROT, ALBUMIN in the last 168 hours. No results for input(s): LIPASE, AMYLASE in the last 168 hours. No results for input(s): AMMONIA in the last 168 hours.  CBC:  Recent Labs Lab 07/10/16 1159 07/10/16 1943 07/11/16 0400 07/13/16 0349  WBC 4.4 6.4 7.7 8.5  HGB 8.5* 8.8* 8.7* 8.6*  HCT  29.3* 29.1* 29.0* 30.3*  MCV 88.0 87.1 87.3 89.6  PLT 214 193 194 200    Cardiac Enzymes: No results for input(s): CKTOTAL, CKMB, CKMBINDEX, TROPONINI in the last 168 hours.  Lipid Panel: No results for input(s): CHOL, TRIG, HDL, CHOLHDL, VLDL, LDLCALC in the last 168 hours.  CBG:  Recent Labs Lab 07/12/16 1939 07/12/16 2320 07/13/16 0407 07/13/16 0735 07/13/16 1226  GLUCAP 178* 170* 205* 181* 190*    Microbiology: Results for orders placed or performed during the hospital encounter of 07/10/16  MRSA PCR Screening     Status: None   Collection Time: 07/10/16  3:38 PM  Result Value Ref Range Status   MRSA by PCR NEGATIVE NEGATIVE Final    Comment:        The GeneXpert MRSA Assay (FDA approved for NASAL specimens only), is one component of a comprehensive MRSA colonization surveillance program. It is not intended to diagnose MRSA infection nor to guide or monitor treatment for MRSA infections.   Culture, blood (routine x 2)     Status: None (Preliminary result)   Collection Time: 07/10/16  4:15 PM  Result Value Ref Range Status   Specimen Description BLOOD RIGHT HAND  Final   Special Requests BOTTLES DRAWN AEROBIC ONLY 10CC  Final   Culture NO GROWTH 3 DAYS  Final   Report Status PENDING  Incomplete  Culture, blood (routine x 2)     Status: None (Preliminary result)   Collection Time: 07/10/16  4:39 PM  Result Value Ref Range Status   Specimen Description BLOOD RIGHT HAND  Final   Special Requests BOTTLES DRAWN AEROBIC ONLY 10CC  Final   Culture NO GROWTH 3 DAYS  Final   Report Status PENDING  Incomplete    Coagulation Studies: No results for input(s): LABPROT, INR in the last 72 hours.  Imaging: Ct Head Wo Contrast  Result Date: 07/13/2016 CLINICAL DATA:  Increased confusion, post respiratory arrest. EXAM: CT HEAD WITHOUT CONTRAST TECHNIQUE: Contiguous axial images were obtained from the base of the skull through the vertex without intravenous contrast.  COMPARISON:  07/10/2016. FINDINGS: Brain: No evidence of acute infarction, hemorrhage, extra-axial collection, ventriculomegaly, or mass effect. Mild brain parenchymal volume loss and periventricular microangiopathy. The gray-white matter differentiation is preserved. Vascular: No hyperdense vessel or unexpected calcification. Skull: Negative for fracture or focal lesion. Sinuses/Orbits: Near complete opacification of the bilateral maxillary sinuses. Other: None. IMPRESSION: No acute intracranial abnormality. No current evidence of anoxic injury to the brain. Mild brain parenchymal atrophy and chronic microvascular disease. Near complete opacification of bilateral maxillary sinuses suggestive of chronic sinusitis. Electronically Signed   By: Fidela Salisbury M.D.   On: 07/13/2016 11:41  Dg Chest Port 1 View  Result Date: 07/13/2016 CLINICAL DATA:  Acute respiratory failure EXAM: PORTABLE CHEST 1 VIEW COMPARISON:  07/11/2016 FINDINGS: Right pacer remains in place, unchanged. Endotracheal tube and NG tube are unchanged. There is cardiomegaly. Improving aeration in the bases with decreasing atelectasis. Minimal residual right base atelectasis. No effusions or edema. No acute bony abnormality. IMPRESSION: Cardiomegaly. Minimal residual right base atelectasis. Electronically Signed  By: Rolm Baptise M.D.   On: 07/13/2016 07:14   Assessment/Plan:  [name] is a 70 year old patient who required intubation after a transesophageal echocardiogram. Today she was noted to have some abnormal movements that were felt to be highly suspicious for possible seizure activity. There is no obvious evidence of seizure activity at the present time however, she has just received sedation.  At this time fentanyl and Versed have been administered. She has received a loading dose of Keppra of 1000 mg. She is minimally responsive at this point likely related to her sedation. EEG has been requested  Plan:  1. Keppra has already  been loaded.  2. EEG has been requested.  Thank you for consulting the neurology service to assist in the care of your patient!      Tayra Dawe A. Tasia Catchings, M.D. Neurohospitalist Phone: [phone]  07/13/2016, 12:44 PM

## 2016-07-13 NOTE — Progress Notes (Signed)
ID:  70yo female former smoker, polymyalgia rheumatica, HTN, hypertrophic cardiomyopathy, CKD, chronic AFib not candidate for anticoagulation d/t prior GI bleeding who was admitted 7/27 for elective TEE for possible watchman device. Difficult to pass TEE probe.  TEE showed ?stunned LV with severe hypokinesis and EF 20-25% and non obstructive HOCM.  However she had significant hypotension and increasing respiratory distress so she was intubated, started on pressors and tx to ICU post procedure.  Felt to have angioedema. F/u echo 7/28 with EF 60%    SUBJECTIVE:  Remains intubated. Opens eyes and is agitated at times. But not responding purposefully. R arm moves less than right.   CURRENT MEDICATIONS: . antiseptic oral rinse  7 mL Mouth Rinse 10 times per day  . chlorhexidine gluconate (SAGE KIT)  15 mL Mouth Rinse BID  . famotidine (PEPCID) IV  20 mg Intravenous Q12H  . insulin aspart  0-15 Units Subcutaneous Q4H   . sodium chloride 40 mL/hr at 07/12/16 1800  . feeding supplement (VITAL HIGH PROTEIN) 1,000 mL (07/12/16 1800)  . fentaNYL infusion INTRAVENOUS 150 mcg/hr (07/13/16 0800)    OBJECTIVE: Physical Exam: Vitals:   07/13/16 0700 07/13/16 0736 07/13/16 0800 07/13/16 0848  BP: (!) 151/92  (!) 151/88 (!) 151/88  Pulse: 75  73 74  Resp:    18  Temp:  97.4 F (36.3 C)    TempSrc:  Oral    SpO2: 100%  100% 100%  Weight:      Height:        Intake/Output Summary (Last 24 hours) at 07/13/16 0856 Last data filed at 07/13/16 0700  Gross per 24 hour  Intake          2849.25 ml  Output             1940 ml  Net           909.25 ml    Telemetry reveals atrial fibrillation with V pacingn in 70s, occasional PVC's   GEN- The patient is intubated, awake and eyes open but won't follow commands. Not moving R arm as much as left. Eyes twitching.  HEENT: normal Oropharynx- +ETT Neck- supple . Mildly elevated JVD Lungs- Clear to ausculation bilaterally, normal work of  breathing Heart- Regular rate and rhythm (paced) no murmur GI- soft, NT, ND, + BS Extremities- no clubbing, cyanosis, or edema Neuro: awake but not following commands. Agitated but not following commands. Doesn't withdraw to pain  Skin- no rash or lesion     LABS: Basic Metabolic Panel:  Recent Labs  07/12/16 0533 07/12/16 0750 07/12/16 1830 07/13/16 0349  NA  --  139  --  140  K  --  5.3*  --  5.4*  CL  --  109  --  109  CO2  --  25  --  26  GLUCOSE  --  214*  --  194*  BUN  --  46*  --  47*  CREATININE  --  1.76*  --  1.38*  CALCIUM  --  9.3  --  9.5  MG 2.4  --  2.6*  --   PHOS 4.1  --  3.2  --    CBC:  Recent Labs  07/11/16 0400 07/13/16 0349  WBC 7.7 8.5  HGB 8.7* 8.6*  HCT 29.0* 30.3*  MCV 87.3 89.6  PLT 194 200    RADIOLOGY: Ct Head Wo Contrast Result Date: 07/10/2016 CLINICAL DATA:  Altered mental status. Transesophageal echocardiogram today with difficult  intubation and hypotension EXAM: CT HEAD WITHOUT CONTRAST TECHNIQUE: Contiguous axial images were obtained from the base of the skull through the vertex without intravenous contrast. COMPARISON:  CT head 10/05/2011 FINDINGS: Ventricle size normal.  Cerebral volume normal. Negative for acute infarct. Negative for hemorrhage or mass. No fluid collection or shift of the midline structures. Mucosal edema throughout the paranasal sinuses. Negative calvarium IMPRESSION: No acute intracranial abnormality. If there is concern of anoxic injury, follow-up CT may be helpful as ischemia may be missed on early CT. Electronically Signed   By: Franchot Gallo M.D.   On: 07/10/2016 15:42  Ct Soft Tissue Neck Wo Contrast Result Date: 07/10/2016 CLINICAL DATA:  Transesophageal echo cardiogram today with difficult intubation. Hypotension. Rule out esophageal injury. EXAM: CT NECK WITHOUT CONTRAST TECHNIQUE: Multidetector CT imaging of the neck was performed following the standard protocol without intravenous contrast. COMPARISON:   None. FINDINGS: Pharynx and larynx: The patient is intubated. Pharyngeal soft tissues are collapsed around endotracheal tube and there may be some pharyngeal edema. Endotracheal tube in good position in the trachea above the carina. No focal hematoma. No soft tissue gas is seen that would suggests pharyngeal or esophageal perforation. No mass-effect or shift of the midline structures. Salivary glands: Parotid and submandibular glands normal bilaterally. Thyroid: Negative Lymph nodes: Negative for cervical lymphadenopathy. Vascular: Patency of vascular structures not assessed without intravenous contrast. Limited intracranial: Negative Visualized orbits: Negative Mastoids and visualized paranasal sinuses: Extensive mucosal edema throughout the paranasal sinuses bilaterally with near complete opacification of the maxillary sinus bilaterally Skeleton: Mild degenerative changes in the cervical spine. No acute skeletal abnormality. Dental disease. Upper chest: Mild atelectasis in the right upper lobe.  No effusion. IMPRESSION: Endotracheal tube in good position. The pharynx is collapsed around endotracheal tube and there may be some pharyngeal edema. No hematoma. No gas in the soft tissues of the pharynx or proximal esophagus that would suggest acute injury. Negative for mass or adenopathy Extensive mucosal edema paranasal sinuses Right upper lobe atelectasis. Electronically Signed   By: Franchot Gallo M.D.   On: 07/10/2016 15:40  Dg Chest Port 1 View Result Date: 07/11/2016 CLINICAL DATA:  Respiratory failure. EXAM: PORTABLE CHEST 1 VIEW COMPARISON:  07/10/2016. FINDINGS: Endotracheal tube, NG tube in stable position. Cardiac pacer in stable position. Stable cardiomegaly. Low lung volumes with mild bibasilar subsegmental atelectasis and or infiltrates/edema. No pleural effusion or pneumothorax. IMPRESSION: 1.  Lines and tubes in stable position. 2. Low lung volumes with mild bibasilar atelectasis and/or  infiltrates/edema. 3. Cardiac pacer stable position.  Stable cardiomegaly. Electronically Signed   By: Hytop   On: 07/11/2016 06:59   ASSESSMENT AND PLAN:  Active Problems:   Chronic atrial fibrillation (HCC)   Complete heart block (HCC)   Persistent atrial fibrillation (HCC)   Acute respiratory failure (HCC)   Hypotension due to drugs   Demand ischemia (HCC)   Respiratory failure (HCC)   Esophageal abnormality   Endotracheal tube present   Somnolence  1.  Acute respiratory failure/hypotension/angioedema -Vent management per CCM. Has been complicated by mental status and laryngeal edema. Does have cuff leak. Mental status will need to be better before weaning trial.  2. Acute delirium - Will plan head CT today - Wean sedation as tolerated 3.  Permanent atrial fibrillation Rate is controlled No OAC 2/2 anemia Watchman procedure deferred for now due to anemia and procedural issues 4. Acute systolic HF Echos reviewed personally. EF during TEE 20-25% with acute biventricular stunning. EF  on TTE 60% with no RWMA. Trop 0.3-0.4 Myoview 12/16 - no significant ischemia Diurese as tolerated to help facilitate extubation 5.  Complete heart block S/p PPM 5.  Acute on Chronic renal failure, stage III-IV (baseline creatinine ~1.5) Creatinine back to baseline 6.  Anemia Hgb has been stable.  7. Neuro -Patient not following commands.Will wean sedation and reassess. Plan head CT today  (CT 7/27 negative) 8. HTN BP elevated but patient currently undergoing vent wean. Can use prn hydralaine to keep SBP < 170.   Remains intubated. CCM working on extubation. Will diurese gently. Remains stable from AF perspective. Etiology of LV stunning unclear. No clear evidence of ischemia currently or on recent Myoview. Would manage medically for now unless develops objective signs of ischemia. Plan head CT today. D/W with CCM team at bedside - appreciate their care.   Critical Care Time devoted  to patient care services described in this note is 35 Minutes.  Kemon Devincenzi,MD 8:56 AM

## 2016-07-13 NOTE — Progress Notes (Signed)
Bedside EEG completed, results pending. 

## 2016-07-13 NOTE — Progress Notes (Addendum)
PULMONARY / CRITICAL CARE MEDICINE   Name: [name] MRN: [id number] DOB: [birth year]    ADMISSION DATE:  07/10/2016 CONSULTATION DATE:  7/27  REFERRING MD:  Allred   CHIEF COMPLAINT:  Respiratory failure   HISTORY OF PRESENT ILLNESS:   70yo female former smoker, polymyalgia rheumatica, HTN, hypertrophic cardiomyopathy, CKD, AFib not candidate for anticoagulation r/t prior GI bleeding who was admitted 7/27 for elective TEE to evaluate L atrial appendage and place watchman device (left atrial appendage closure system for long term stroke prevention).  Pt was anemic on admit and decision was made to not proceed with watchman device (as it would require asa, coumadin for short time afterwards) but TEE was done to eval function and anatomy.  MD had significant difficulty passing the probe, which was eventually passed by anesthesia.  TEE showed ?stunned LV with severe hypokinesis and EF 20-25% and non obstructive HOCM.  However she had significant hypotension and increasing respiratory distress so she was intubated, started on pressors and tx to ICU post procedure.     SUBJECTIVE:  RN reports pt with intermittent periods of agitation. Concerned despite reduced fentanyl, she does not follow commands - localizes and grimaces.  Moves all ext's.  Increased oral secretions.  + cuff leak   VITAL SIGNS: BP (!) 151/88   Pulse 74   Temp 97.4 F (36.3 C) (Oral)   Resp 18   Ht 5\' 5"  (1.651 m)   Wt 187 lb 2.7 oz (84.9 kg)   SpO2 100%   BMI 31.15 kg/m   HEMODYNAMICS:    VENTILATOR SETTINGS: Vent Mode: PRVC FiO2 (%):  [40 %] 40 % Set Rate:  [16 bmp] 16 bmp Vt Set:  [450 mL] 450 mL PEEP:  [5 cmH20] 5 cmH20 Plateau Pressure:  [15 cmH20-24 cmH20] 15 cmH20  INTAKE / OUTPUT: I/O last 3 completed shifts: In: 4116.5 [I.V.:2025; NG/GT:1891.5; IV Piggyback:200] Out: 2373 [Urine:2373]  PHYSICAL EXAMINATION:  General:  wdwn female, NAD on vent Neuro: opens eyes to name, moves  spontaneously but not following commands, eyes open but does not focus on staff HEENT:  MM moist, mild JVD, ETT  Cardiovascular:  s1s2 rrr, paced  Lungs:  resps even, non labored on vent, slightly diminished bases otherwise clear  Abdomen:  Round, soft, +bs  Musculoskeletal:  Warm and dry, +1 generalized edema   LABS:  BMET  Recent Labs Lab 07/11/16 0400 07/12/16 0750 07/13/16 0349  NA 140 139 140  K 4.9 5.3* 5.4*  CL 106 109 109  CO2 27 25 26   BUN 24* 46* 47*  CREATININE 1.45* 1.76* 1.38*  GLUCOSE 149* 214* 194*    Electrolytes  Recent Labs Lab 07/11/16 0400  07/11/16 1647 07/12/16 0533 07/12/16 0750 07/12/16 1830 07/13/16 0349  CALCIUM 9.4  --   --   --  9.3  --  9.5  MG  --   < > 2.2 2.4  --  2.6*  --   PHOS  --   < > 5.0* 4.1  --  3.2  --   < > = values in this interval not displayed.  CBC  Recent Labs Lab 07/10/16 1943 07/11/16 0400 07/13/16 0349  WBC 6.4 7.7 8.5  HGB 8.8* 8.7* 8.6*  HCT 29.1* 29.0* 30.3*  PLT 193 194 200    Coag's No results for input(s): APTT, INR in the last 168 hours.  Sepsis Markers  Recent Labs Lab 07/10/16 1703  LATICACIDVEN 1.1    ABG  Recent Labs Lab  07/10/16 1702 07/11/16 0350  PHART 7.318* 7.302*  PCO2ART 56.2* 51.3*  PO2ART 496.0* 156*    Liver Enzymes No results for input(s): AST, ALT, ALKPHOS, BILITOT, ALBUMIN in the last 168 hours.  Cardiac Enzymes No results for input(s): TROPONINI, PROBNP in the last 168 hours.  Glucose  Recent Labs Lab 07/12/16 1142 07/12/16 1628 07/12/16 1939 07/12/16 2320 07/13/16 0407 07/13/16 0735  GLUCAP 178* 160* 178* 170* 205* 181*    Imaging Dg Chest Port 1 View  Result Date: 07/13/2016 CLINICAL DATA:  Acute respiratory failure EXAM: PORTABLE CHEST 1 VIEW COMPARISON:  07/11/2016 FINDINGS: Right pacer remains in place, unchanged. Endotracheal tube and NG tube are unchanged. There is cardiomegaly. Improving aeration in the bases with decreasing atelectasis.  Minimal residual right base atelectasis. No effusions or edema. No acute bony abnormality. IMPRESSION: Cardiomegaly. Minimal residual right base atelectasis. Electronically Signed   By: Rolm Baptise M.D.   On: 07/13/2016 07:14    STUDIES:  TEE 7/27 >>  ?stunned LV with severe hypokinesis and EF 20-25% and non obstructive HOCM 2D echo 7/28 >> LVEF 55-60%, moderate asymmetric septal hypertrophy, intermittent diastolic dysfunction (AF), no RWMA, PA peak 62  CT soft tissue neck 7/27 >> pharynx is collapsed around ETT, ? Pharyngeal edema, no hematoma, no gas in soft tissue, extensive mucosal edema in paranasal sinuses, RUL atelectasis CT Head 7/27 >> no acute abnormality  CULTURES: MRSA 7/27 (-) Trach asp 7/27 >>  Blood 7/27 >>  ANTIBIOTICS: None  SIGNIFICANT EVENTS: 7/27 TEE. Went into resp distress and hypotension during TEE. Admit 2H  LINES/TUBES: ETT 7/27 >> R rad aline 7/27 >> 7/29  DISCUSSION: 70yo female with persistent AFib unable to be anticoagulated r/t previous GI bleeding and known hypertrophic cardiomyopathy admitted after hypotension and resp fx during TEE  ASSESSMENT / PLAN:  PULMONARY Acute hypoxemic hypercapneic respiratory failure secondary to inability to protect airway. Swollen airways after TEE. Has a size 7 ET, Stunned Myocardium/Demand ischemia COPD - by history but she is not on meds and spirometry did not reveal COPD. Doubt she has an exacerbation now Likely untreated OSA / OHS Elevated PA Pressures on ECHO P:   CT soft tissue neck was (-) for infection but has pharyngeal/airway edema > decadron for airway swelling Assess cuff leak assessment prior to extubation Pt with very difficult airway and has a size 7 ETT.  Vent support - 8cc/kg  Wean PEEP / FiO2 for sats > 92% Ok for PSV weaning as tolerated, no extubation 7/30 Lasix per Cardiology, hopeful to facilitate extubation   CARDIOVASCULAR Stunned LV with significant hypokinesis EF 20-25%. S/P TEE  7/27 Chronic AFib - paced, not candidate for anticoagulation r/t previous GI bleeding  Complete heart block  P:  Cardiology following No anticoagulation 2/2 GI bleed  Tele monitoring  RENAL CKD IIIB P:   Trend BMP / UOP  KVO IVF Lasix x1   GASTROINTESTINAL Hx GI bleed - no current s/s bleeding  P:   PPI  Monitor Hgb TF per nutrition  HEMATOLOGIC Anemia  P:  Trend CBC SCD's for DVT prophylaxis   INFECTIOUS No active issue  P:   Monitor fever, WBC curve off abx   ENDOCRINE Chronic steroids (on 5 mg of pred daily) Hx polymyalgia rheumatica  P:   Decadron for airway edema Cont maintenance prednisone 5mg /day  Add SSI while on decadron  NEUROLOGIC Concern for AMS - no follow commands 7/30 but moving all ext's, ? Decrease on R Sedation needs on vent  P:   RASS goal: 0 Reassess CT head Consider EEG Avoid propofol as it caused BP to drop during TEE.  Fentanyl gtt for pain PRN versed for sedation  Daily WUA / SBT  FAMILY  - Updates:  Family updated at bedside - daughter.    - Inter-disciplinary family meet or Palliative Care meeting due by:  8/3  Noe Gens, NP-C [facility] Pgr: [phone] or if no answer [phone] 07/13/2016, 9:04 AM   STAFF NOTE: I, Merrie Roof, MD FACP have personally reviewed patient's available data, including medical history, events of note, physical examination and test results as part of my evaluation. I have discussed with resident/NP and other care providers such as pharmacist, RN and RRT. In addition, I personally evaluated patient and elicited key findings of: agitation upper ext , moving and flailing, delirium?, ct head done reviewed neg acute, wean cpap 5 ps 5, re assess leak, neurostatus precludes extubation at this time, crt is down, lasix required, need to avoid deliriogenic meds, dc versed,wean as able, leak improved, may need MRi brain if not improved (if able with pacer)and EGG, I updated family,  add precedex The patient is critically ill with multiple organ systems failure and requires high complexity decision making for assessment and support, frequent evaluation and titration of therapies, application of advanced monitoring technologies and extensive interpretation of multiple databases.   Critical Care Time devoted to patient care services described in this note is 30 Minutes. This time reflects time of care of this signee: Merrie Roof, MD FACP. This critical care time does not reflect procedure time, or teaching time or supervisory time of PA/NP/Med student/Med Resident etc but could involve care discussion time. Rest per NP/medical resident whose note is outlined above and that I agree with   Lavon Paganini. Titus Mould, MD, [place] Pgr: [facility] 07/13/2016 11:56 AM    Update: I re evaluted her. Now with cyclical eye and mouth movement, concerning for focus. Get neuro, give ativan , keppra, eeg Updated daughter

## 2016-07-14 ENCOUNTER — Inpatient Hospital Stay (HOSPITAL_COMMUNITY): Payer: Medicare Other

## 2016-07-14 ENCOUNTER — Encounter (HOSPITAL_COMMUNITY): Payer: Self-pay | Admitting: *Deleted

## 2016-07-14 LAB — CBC
HCT: 30.4 % — ABNORMAL LOW (ref 36.0–46.0)
Hemoglobin: 8.7 g/dL — ABNORMAL LOW (ref 12.0–15.0)
MCH: 25.2 pg — AB (ref 26.0–34.0)
MCHC: 28.6 g/dL — ABNORMAL LOW (ref 30.0–36.0)
MCV: 88.1 fL (ref 78.0–100.0)
PLATELETS: 193 10*3/uL (ref 150–400)
RBC: 3.45 MIL/uL — AB (ref 3.87–5.11)
RDW: 15.6 % — ABNORMAL HIGH (ref 11.5–15.5)
WBC: 7.4 10*3/uL (ref 4.0–10.5)

## 2016-07-14 LAB — BASIC METABOLIC PANEL
Anion gap: 5 (ref 5–15)
BUN: 53 mg/dL — ABNORMAL HIGH (ref 6–20)
CALCIUM: 9.8 mg/dL (ref 8.9–10.3)
CO2: 31 mmol/L (ref 22–32)
CREATININE: 1.24 mg/dL — AB (ref 0.44–1.00)
Chloride: 105 mmol/L (ref 101–111)
GFR, EST AFRICAN AMERICAN: 49 mL/min — AB (ref >60)
GFR, EST NON AFRICAN AMERICAN: 42 mL/min — AB (ref >60)
Glucose, Bld: 159 mg/dL — ABNORMAL HIGH (ref 65–99)
Potassium: 4.6 mmol/L (ref 3.5–5.1)
SODIUM: 141 mmol/L (ref 135–145)

## 2016-07-14 LAB — GLUCOSE, CAPILLARY
GLUCOSE-CAPILLARY: 148 mg/dL — AB (ref 65–99)
GLUCOSE-CAPILLARY: 156 mg/dL — AB (ref 65–99)
GLUCOSE-CAPILLARY: 167 mg/dL — AB (ref 65–99)
GLUCOSE-CAPILLARY: 176 mg/dL — AB (ref 65–99)
Glucose-Capillary: 167 mg/dL — ABNORMAL HIGH (ref 65–99)
Glucose-Capillary: 151 mg/dL — ABNORMAL HIGH (ref 65–99)

## 2016-07-14 MED ORDER — MAGNESIUM HYDROXIDE 400 MG/5ML PO SUSP
15.0000 mL | Freq: Every day | ORAL | Status: DC | PRN
  Administered 2016-07-15: 15 mL via ORAL
  Filled 2016-07-14: qty 30

## 2016-07-14 MED ORDER — PREDNISONE 10 MG PO TABS
5.0000 mg | ORAL_TABLET | Freq: Every day | ORAL | Status: DC
  Administered 2016-07-14: 5 mg via ORAL
  Filled 2016-07-14: qty 1

## 2016-07-14 NOTE — Progress Notes (Signed)
PULMONARY / CRITICAL CARE MEDICINE   Name: [name] MRN: [id number] DOB: 02-05-1946    ADMISSION DATE:  07/10/2016 CONSULTATION DATE:  7/27  REFERRING MD:  Allred   CHIEF COMPLAINT:  Respiratory failure   HISTORY OF PRESENT ILLNESS:   70yo female former smoker, polymyalgia rheumatica, HTN, hypertrophic cardiomyopathy, CKD, AFib not candidate for anticoagulation r/t prior GI bleeding who was admitted 7/27 for elective TEE to evaluate L atrial appendage and place watchman device (left atrial appendage closure system for long term stroke prevention).  Pt was anemic on admit and decision was made to not proceed with watchman device (as it would require asa, coumadin for short time afterwards) but TEE was done to eval function and anatomy.  MD had significant difficulty passing the probe, which was eventually passed by anesthesia.  TEE showed ?stunned LV with severe hypokinesis and EF 20-25% and non obstructive HOCM.  However she had significant hypotension and increasing respiratory distress so she was intubated, started on pressors and tx to ICU post procedure.    SUBJECTIVE: Concern for seizures yesterday. Assessed by Neurology with EEG, MRI head  VITAL SIGNS: BP (!) 144/86   Pulse 77   Temp 98 F (36.7 C) (Oral)   Resp (!) 24   Ht 5\' 5"  (1.651 m)   Wt 187 lb 2.7 oz (84.9 kg)   SpO2 100%   BMI 31.15 kg/m   HEMODYNAMICS:    VENTILATOR SETTINGS: Vent Mode: PRVC FiO2 (%):  [40 %] 40 % Set Rate:  [16 bmp] 16 bmp Vt Set:  [450 mL] 450 mL PEEP:  [5 cmH20] 5 cmH20 Plateau Pressure:  [15 cmH20-25 cmH20] 20 cmH20  INTAKE / OUTPUT: I/O last 3 completed shifts: In: 4268.1 [I.V.:2116.6; NG/GT:1891.5; IV Piggyback:260] Out: [id number] [Urine:5590]  PHYSICAL EXAMINATION:  General:  No distress on vent Neuro: opens eyes to name, moves spontaneously but not following commands, eyes open but does not focus on staff HEENT:  ETT in place, moist mucus membranes Cardiovascular:  S1,  S2, RRR, no MRGs Lungs:  Clear, no wheeze or crackles  Abdomen:  Soft, + BS  Musculoskeletal:  Warm and dry, +1 edema   LABS:  BMET  Recent Labs Lab 07/11/16 0400 07/12/16 0750 07/13/16 0349  NA 140 139 140  K 4.9 5.3* 5.4*  CL 106 109 109  CO2 27 25 26   BUN 24* 46* 47*  CREATININE 1.45* 1.76* 1.38*  GLUCOSE 149* 214* 194*    Electrolytes  Recent Labs Lab 07/11/16 0400  07/11/16 1647 07/12/16 0533 07/12/16 0750 07/12/16 1830 07/13/16 0349  CALCIUM 9.4  --   --   --  9.3  --  9.5  MG  --   < > 2.2 2.4  --  2.6*  --   PHOS  --   < > 5.0* 4.1  --  3.2  --   < > = values in this interval not displayed.  CBC  Recent Labs Lab 07/10/16 1943 07/11/16 0400 07/13/16 0349  WBC 6.4 7.7 8.5  HGB 8.8* 8.7* 8.6*  HCT 29.1* 29.0* 30.3*  PLT 193 194 200    Coag's No results for input(s): APTT, INR in the last 168 hours.  Sepsis Markers  Recent Labs Lab 07/10/16 1703  LATICACIDVEN 1.1    ABG  Recent Labs Lab 07/10/16 1702 07/11/16 0350  PHART 7.318* 7.302*  PCO2ART 56.2* 51.3*  PO2ART 496.0* 156*    Liver Enzymes No results for input(s): AST, ALT, ALKPHOS, BILITOT, ALBUMIN in  the last 168 hours.  Cardiac Enzymes No results for input(s): TROPONINI, PROBNP in the last 168 hours.  Glucose  Recent Labs Lab 07/13/16 0407 07/13/16 0735 07/13/16 1226 07/13/16 1527 07/13/16 2046 07/13/16 2350  GLUCAP 205* 181* 190* 160* 182* 176*    Imaging Ct Head Wo Contrast  Result Date: 07/13/2016 CLINICAL DATA:  Increased confusion, post respiratory arrest. EXAM: CT HEAD WITHOUT CONTRAST TECHNIQUE: Contiguous axial images were obtained from the base of the skull through the vertex without intravenous contrast. COMPARISON:  07/10/2016. FINDINGS: Brain: No evidence of acute infarction, hemorrhage, extra-axial collection, ventriculomegaly, or mass effect. Mild brain parenchymal volume loss and periventricular microangiopathy. The gray-white matter differentiation  is preserved. Vascular: No hyperdense vessel or unexpected calcification. Skull: Negative for fracture or focal lesion. Sinuses/Orbits: Near complete opacification of the bilateral maxillary sinuses. Other: None. IMPRESSION: No acute intracranial abnormality. No current evidence of anoxic injury to the brain. Mild brain parenchymal atrophy and chronic microvascular disease. Near complete opacification of bilateral maxillary sinuses suggestive of chronic sinusitis. Electronically Signed   By: Fidela Salisbury M.D.   On: 07/13/2016 11:41  Dg Chest Port 1 View  Result Date: 07/13/2016 CLINICAL DATA:  Acute respiratory failure EXAM: PORTABLE CHEST 1 VIEW COMPARISON:  07/11/2016 FINDINGS: Right pacer remains in place, unchanged. Endotracheal tube and NG tube are unchanged. There is cardiomegaly. Improving aeration in the bases with decreasing atelectasis. Minimal residual right base atelectasis. No effusions or edema. No acute bony abnormality. IMPRESSION: Cardiomegaly. Minimal residual right base atelectasis. Electronically Signed   By: Rolm Baptise M.D.   On: 07/13/2016 07:14    STUDIES:  TEE 7/27 >>  ?stunned LV with severe hypokinesis and EF 20-25% and non obstructive HOCM 2D echo 7/28 >> LVEF 55-60%, moderate asymmetric septal hypertrophy, intermittent diastolic dysfunction (AF), no RWMA, PA peak 62  CT soft tissue neck 7/27 >> pharynx is collapsed around ETT, ? Pharyngeal edema, no hematoma, no gas in soft tissue, extensive mucosal edema in paranasal sinuses, RUL atelectasis CT Head 7/27 >> no acute abnormality  CULTURES: MRSA 7/27 (-) Trach asp 7/27 >>  Blood 7/27 >>  ANTIBIOTICS: None  SIGNIFICANT EVENTS: 7/27 TEE. Went into resp distress and hypotension during TEE. Admit 2H  LINES/TUBES: ETT 7/27 >> R rad aline 7/27 >> 7/29  DISCUSSION: 70yo female with persistent AFib unable to be anticoagulated r/t previous GI bleeding and known hypertrophic cardiomyopathy admitted after  hypotension and resp fx during TEE.  Unable to wean yesterday because of concern for neuro status and possible seizures. EEG results noted > shows diffuse slowing without seizure activity. Our goal today is to try and wean off the heavy sedation and resume weaning trials   ASSESSMENT / PLAN:  PULMONARY Acute hypoxemic hypercapneic respiratory failure secondary to inability to protect airway. Swollen airways after TEE.  Pt with very difficult airway and has a size 7 ETT.  COPD - by history but she is not on meds and spirometry did not reveal COPD. Doubt she has an exacerbation now Likely untreated OSA / OHS Elevated PA Pressures on ECHO.  P:   CT soft tissue neck was negative for infection but has pharyngeal/airway edema. Continue decadron for airway swelling Continue pressure support weans. Check cuff leak today.  CARDIOVASCULAR Stunned LV with significant hypokinesis EF 20-25%. S/P TEE 7/27 Chronic AFib - paced, not candidate for anticoagulation r/t previous GI bleeding  Complete heart block  P:  Cardiology following No anticoagulation due to GI bleed  Tele monitoring Lasix  per Cardiology.  RENAL CKD IIIB P:   Trend BMP / UOP  KVO IVF  GASTROINTESTINAL Hx GI bleed - no current s/s bleeding  P:   PPI  Monitor Hgb TF per nutrition  HEMATOLOGIC Anemia  P:  Trend CBC SCD's for DVT prophylaxis   INFECTIOUS No active issue  P:   Monitor fever, WBC curve off abx   ENDOCRINE Chronic steroids (on 5 mg of pred daily) Hx polymyalgia rheumatica  P:   S/p Decadron X 3 days for airway edema Cont maintenance prednisone 5mg /day  SSI while on steroids  NEUROLOGIC Concern for seizures Neurology consulted. Unable to get MRI brain because of pacemaker P:   RASS goal: 0 Continue keppra Avoid propofol as it caused BP to drop during TEE.  Fentanyl gtt for pain and she was started on versed yesterday. Wean off sedation today  FAMILY  - Updates:  No family at  bedside 7/31 - Inter-disciplinary family meet or Palliative Care meeting due by:  8/3.  Critical care time- 35 mins.  Marshell Garfinkel MD  Pulmonary and Critical Care Pager [phone] If no answer or after 3pm call: [phone] 07/14/2016, 2:13 AM

## 2016-07-14 NOTE — Progress Notes (Addendum)
Subjective: Now off Propofol. Awake, intubated able to follow simple commands but still very drowsy.   Exam: Vitals:   07/14/16 0937 07/14/16 1000  BP: (!) 162/96 (!) 165/92  Pulse:  76  Resp:  13  Temp:          Gen: In bed, NAD MS: intubated off sedation. Able to track my finger and attempt to raise her left arm. Winces to pain.  [id number], FACE symmetric, facial sensation intact. Blinks to threat bilaterally Motor: wiggles toes, can raise left forearm off bed 1 inch.  Sensory: intact to noxious stimuli   Pertinent Labs/Diagnostics: EEG shows no epileptiform activity.   Etta Quill PA-C [facility] [phone]  Impression:  70 year old patient who required intubation after a transesophageal echocardiogram. Today she was noted to have some abnormal movements that were felt to be highly suspicious for possible seizure activity. EEG has been obtained and shows no active seizure activity and no sharps or irritability. CT head shows no acute stroke. At this time would Stop Keppra as this may be contributing to her drowsiness. MRI brain when it is clear her defibrillator is MRI compatible.    Dr. Shon Hale to  Attend not.    07/14/2016, 10:51 AM   Neurology Attending Addendum:  Patient seen, examined, and d/w PA. I have reviewed his note and agree with his findings, assessment, and plan as documented with the following additions.   Chart reviewed at length. In brief, this is a 70 year old woman who was undergoing a transesophageal echocardiogram on 07/10/16 as part of a screening evaluation for consideration of Watchman left atrial appendage implantation. The procedure was complicated by respiratory failure and hypotension requiring endotracheal intubation and pressor support. She was maintained in the ICU on the ventilator. On 07/13/16, she apparently had some irregular movements of her eyes and mouth that were concerning for seizure. In addition, it was reported that she  was not moving her right arm. This prompted neurology consultation. CT scan of head was obtained and did not show any acute abnormality. EEG was completed on 07/13/16 and showed moderate-severe slowing consistent with encephalopathy but no seizures. Today, she has been more alert with discontinuation of some of her sedation. She remains intubated. She is able to follow some commands and her family reports that she has been nodding and shaking her head appropriately to some of their questions.  Physical exam: General: She is intubated, still on low-dose fentanyl for sedation. She will open her eyes to voice and will track me from one side of the bed to the other. She follows some appendicular commands with delay but this is inconsistent. Cranial nerves: Pupils are equal and reactive. Eyes are conjugate. Face is grossly symmetric with symmetric grimace. Corneals appear intact. The lower part of her phases of secured by tubes and tape. Motor: Tone is diminished throughout. She does not follow commands for confrontational strength testing. She is able to wiggle the toes in both feet but I don't get much more than that at the time of my assessment. No abnormal movements are observed. Sensation: She grimaces with weak withdrawal to nailbed pressure 4. DTRs: 2+, symmetric. Toes are downgoing.  Impression: 1. Possible seizure: She had an episode of abnormal movements on 07/13/16. It is not clear to me on the basis of what has been described that she actually had seizure activity. EEG did not indicate any epileptiform abnormalities or ongoing seizure. At this time, I would favor observation off of antiepileptic given the ambiguity of  the episode and likelihood of adverse effects from any AED. May consider repeating an EEG once she is fully off of sedation.  2. Possible right upper extremity weakness: I was reported that she was not moving her right arm as well. Her family confirms this. CT scan of the head was  obtained on 7/30 and did not reveal any acute abnormality. This can be repeated to see if there is been any evidence of a stroke that did not show up on the first scan. MRI would be the best test if her pacer is compatible.   This was discussed with the patient's family at the bedside. They're in agreement with the plan as stated. There given the opportunity to ask any questions and these were addressed to their satisfaction.

## 2016-07-14 NOTE — Progress Notes (Signed)
SUBJECTIVE: The patient is intubated and sedated.  CURRENT MEDICATIONS: . amLODipine  5 mg Oral Daily  . antiseptic oral rinse  7 mL Mouth Rinse 10 times per day  . chlorhexidine gluconate (SAGE KIT)  15 mL Mouth Rinse BID  . famotidine (PEPCID) IV  20 mg Intravenous Q12H  . insulin aspart  0-15 Units Subcutaneous Q4H  . levETIRAcetam  500 mg Intravenous Q12H  . predniSONE  5 mg Oral Q breakfast   . sodium chloride 10 mL/hr at 07/14/16 0800  . feeding supplement (VITAL HIGH PROTEIN) 1,000 mL (07/14/16 0800)  . fentaNYL infusion INTRAVENOUS 50 mcg/hr (07/14/16 0800)  . midazolam (VERSED) infusion 2 mg/hr (07/14/16 0800)    OBJECTIVE: Physical Exam: Vitals:   07/14/16 0600 07/14/16 0700 07/14/16 0745 07/14/16 0800  BP: 126/76 136/90 136/90   Pulse: 76 74 80   Resp: _0 Temp:    97.5 F (36.4 C)  TempSrc:    Axillary  SpO2: 100% 100% 100%   Weight:      Height:        Intake/Output Summary (Last 24 hours) at 07/14/16 4132 Last data filed at 07/14/16 0800  Gross per 24 hour  Intake           2265.6 ml  Output             4575 ml  Net          -2309.4 ml    Telemetry reveals atrial fibrillation with V pacing, occasional PVC's   GEN- The patient is intubated, will open eyes to voice and follow commands Head- normocephalic, atraumatic Eyes-  Sclera clear, conjunctiva pink Ears- hearing intact Oropharynx- +ETT Neck- supple  Lungs- Clear to ausculation bilaterally, normal work of breathing Heart- Regular rate and rhythm (paced) GI- soft, NT, ND, + BS Extremities- no clubbing, cyanosis, or edema Skin- no rash or lesion    LABS: Basic Metabolic Panel:  Recent Labs  07/12/16 0533  07/12/16 1830 07/13/16 0349 07/14/16 0218  NA  --   < >  --  140 141  K  --   < >  --  5.4* 4.6  CL  --   < >  --  109 105  CO2  --   < >  --  26 31  GLUCOSE  --   < >  --  194* 159*  BUN  --   < >  --  47* 53*  CREATININE  --   < >  --  1.38* 1.24*  CALCIUM  --   < >   --  9.5 9.8  MG 2.4  --  2.6*  --   --   PHOS 4.1  --  3.2  --   --   < > = values in this interval not displayed. CBC:  Recent Labs  07/13/16 0349 07/14/16 0218  WBC 8.5 7.4  HGB 8.6* 8.7*  HCT 30.3* 30.4*  MCV 89.6 88.1  PLT 200 193    RADIOLOGY: Ct Head Wo Contrast Result Date: 07/10/2016 CLINICAL DATA:  Altered mental status. Transesophageal echocardiogram today with difficult intubation and hypotension EXAM: CT HEAD WITHOUT CONTRAST TECHNIQUE: Contiguous axial images were obtained from the base of the skull through the vertex without intravenous contrast. COMPARISON:  CT head 10/05/2011 FINDINGS: Ventricle size normal.  Cerebral volume normal. Negative for acute infarct. Negative for hemorrhage or mass. No fluid collection or shift of the midline structures. Mucosal edema  throughout the paranasal sinuses. Negative calvarium IMPRESSION: No acute intracranial abnormality. If there is concern of anoxic injury, follow-up CT may be helpful as ischemia may be missed on early CT. Electronically Signed   By: Franchot Gallo M.D.   On: 07/10/2016 15:42  Ct Soft Tissue Neck Wo Contrast Result Date: 07/10/2016 CLINICAL DATA:  Transesophageal echo cardiogram today with difficult intubation. Hypotension. Rule out esophageal injury. EXAM: CT NECK WITHOUT CONTRAST TECHNIQUE: Multidetector CT imaging of the neck was performed following the standard protocol without intravenous contrast. COMPARISON:  None. FINDINGS: Pharynx and larynx: The patient is intubated. Pharyngeal soft tissues are collapsed around endotracheal tube and there may be some pharyngeal edema. Endotracheal tube in good position in the trachea above the carina. No focal hematoma. No soft tissue gas is seen that would suggests pharyngeal or esophageal perforation. No mass-effect or shift of the midline structures. Salivary glands: Parotid and submandibular glands normal bilaterally. Thyroid: Negative Lymph nodes: Negative for cervical  lymphadenopathy. Vascular: Patency of vascular structures not assessed without intravenous contrast. Limited intracranial: Negative Visualized orbits: Negative Mastoids and visualized paranasal sinuses: Extensive mucosal edema throughout the paranasal sinuses bilaterally with near complete opacification of the maxillary sinus bilaterally Skeleton: Mild degenerative changes in the cervical spine. No acute skeletal abnormality. Dental disease. Upper chest: Mild atelectasis in the right upper lobe.  No effusion. IMPRESSION: Endotracheal tube in good position. The pharynx is collapsed around endotracheal tube and there may be some pharyngeal edema. No hematoma. No gas in the soft tissues of the pharynx or proximal esophagus that would suggest acute injury. Negative for mass or adenopathy Extensive mucosal edema paranasal sinuses Right upper lobe atelectasis. Electronically Signed   By: Franchot Gallo M.D.   On: 07/10/2016 15:40  Dg Chest Port 1 View Result Date: 07/11/2016 CLINICAL DATA:  Respiratory failure. EXAM: PORTABLE CHEST 1 VIEW COMPARISON:  07/10/2016. FINDINGS: Endotracheal tube, NG tube in stable position. Cardiac pacer in stable position. Stable cardiomegaly. Low lung volumes with mild bibasilar subsegmental atelectasis and or infiltrates/edema. No pleural effusion or pneumothorax. IMPRESSION: 1.  Lines and tubes in stable position. 2. Low lung volumes with mild bibasilar atelectasis and/or infiltrates/edema. 3. Cardiac pacer stable position.  Stable cardiomegaly. Electronically Signed   By: [place]   On: 07/11/2016 06:59   ASSESSMENT AND PLAN:  Active Problems:   Chronic atrial fibrillation (HCC)   Complete heart block (HCC)   Persistent atrial fibrillation (HCC)   Acute respiratory failure (HCC)   Hypotension due to drugs   Demand ischemia (HCC)   Respiratory failure (HCC)   Esophageal abnormality   Endotracheal tube present   Somnolence   Delirium  1.  Acute respiratory  failure/hypotension BP stable overnight Vent management per CCM   2.  Permanent atrial fibrillation No OAC 2/2 anemia With difficulty with sedation and intubation during TEE, she is at increased risk for procedures going forward, would be hesitant to pursue watchman in the future  3.  Complete heart block Normal PPM function this admission  4.  Chronic renal failure Stable  5.  Anemia Stable today   6.  Concern for AMS Seen by neurology Loaded on Keppra CT head yesterday with no acute abnormality, no current evidence of anoxic brain injury   Chanetta Marshall, NP 07/14/2016 8:26 AM   I have seen, examined the patient, and reviewed the above assessment and plan.  On exam, she is awake and will nod head appropriately to my commands.  She says she would  like to be extubated.  Changes to above are made where necessary.  I think that we should wean off sedation and then extubate once awake.  Co Sign: Thompson Grayer, MD 07/14/2016

## 2016-07-14 NOTE — Care Management Important Message (Signed)
Important Message  Patient Details  Name: [name] MRN: [id number] Date of Birth: May 08, 1946   Medicare Important Message Given:  Yes    Loann Quill 07/14/2016, 3:09 PM

## 2016-07-15 ENCOUNTER — Inpatient Hospital Stay (HOSPITAL_COMMUNITY): Payer: Medicare Other

## 2016-07-15 LAB — CULTURE, BLOOD (ROUTINE X 2)
Culture: NO GROWTH
Culture: NO GROWTH

## 2016-07-15 LAB — BASIC METABOLIC PANEL
Anion gap: 6 (ref 5–15)
BUN: 44 mg/dL — AB (ref 6–20)
CHLORIDE: 103 mmol/L (ref 101–111)
CO2: 32 mmol/L (ref 22–32)
Calcium: 10 mg/dL (ref 8.9–10.3)
Creatinine, Ser: 1.06 mg/dL — ABNORMAL HIGH (ref 0.44–1.00)
GFR calc Af Amer: 59 mL/min — ABNORMAL LOW (ref >60)
GFR calc non Af Amer: 51 mL/min — ABNORMAL LOW (ref >60)
Glucose, Bld: 123 mg/dL — ABNORMAL HIGH (ref 65–99)
POTASSIUM: 4.5 mmol/L (ref 3.5–5.1)
SODIUM: 141 mmol/L (ref 135–145)

## 2016-07-15 LAB — POCT I-STAT 3, ART BLOOD GAS (G3+)
ACID-BASE EXCESS: 9 mmol/L — AB (ref 0.0–2.0)
BICARBONATE: 34.8 meq/L — AB (ref 20.0–24.0)
O2 Saturation: 99 %
PH ART: 7.433 (ref 7.350–7.450)
TCO2: 36 mmol/L (ref 0–100)
pCO2 arterial: 52 mmHg — ABNORMAL HIGH (ref 35.0–45.0)
pO2, Arterial: 138 mmHg — ABNORMAL HIGH (ref 80.0–100.0)

## 2016-07-15 LAB — CBC
HCT: 31.2 % — ABNORMAL LOW (ref 36.0–46.0)
HEMOGLOBIN: 9.1 g/dL — AB (ref 12.0–15.0)
MCH: 25.5 pg — AB (ref 26.0–34.0)
MCHC: 29.2 g/dL — ABNORMAL LOW (ref 30.0–36.0)
MCV: 87.4 fL (ref 78.0–100.0)
Platelets: 215 10*3/uL (ref 150–400)
RBC: 3.57 MIL/uL — AB (ref 3.87–5.11)
RDW: 15.6 % — ABNORMAL HIGH (ref 11.5–15.5)
WBC: 8.1 10*3/uL (ref 4.0–10.5)

## 2016-07-15 LAB — GLUCOSE, CAPILLARY
GLUCOSE-CAPILLARY: 130 mg/dL — AB (ref 65–99)
GLUCOSE-CAPILLARY: 143 mg/dL — AB (ref 65–99)
Glucose-Capillary: 137 mg/dL — ABNORMAL HIGH (ref 65–99)
Glucose-Capillary: 183 mg/dL — ABNORMAL HIGH (ref 65–99)
Glucose-Capillary: 223 mg/dL — ABNORMAL HIGH (ref 65–99)
Glucose-Capillary: 247 mg/dL — ABNORMAL HIGH (ref 65–99)

## 2016-07-15 LAB — PHOSPHORUS: PHOSPHORUS: 3.2 mg/dL (ref 2.5–4.6)

## 2016-07-15 LAB — MAGNESIUM: MAGNESIUM: 2.1 mg/dL (ref 1.7–2.4)

## 2016-07-15 MED ORDER — METHYLPREDNISOLONE SODIUM SUCC 40 MG IJ SOLR
40.0000 mg | Freq: Four times a day (QID) | INTRAMUSCULAR | Status: DC
  Administered 2016-07-15 – 2016-07-18 (×12): 40 mg via INTRAVENOUS
  Filled 2016-07-15 (×12): qty 1

## 2016-07-15 MED ORDER — RACEPINEPHRINE HCL 2.25 % IN NEBU
INHALATION_SOLUTION | RESPIRATORY_TRACT | Status: AC
  Administered 2016-07-15: 0.5 mL
  Filled 2016-07-15: qty 0.5

## 2016-07-15 MED ORDER — RACEPINEPHRINE HCL 2.25 % IN NEBU
0.5000 mL | INHALATION_SOLUTION | Freq: Once | RESPIRATORY_TRACT | Status: AC
  Administered 2016-07-15: 0.5 mL via RESPIRATORY_TRACT

## 2016-07-15 MED ORDER — LORAZEPAM 2 MG/ML IJ SOLN
INTRAMUSCULAR | Status: AC
  Filled 2016-07-15: qty 1

## 2016-07-15 MED ORDER — MIDAZOLAM HCL 2 MG/2ML IJ SOLN
INTRAMUSCULAR | Status: AC
  Administered 2016-07-15: 2 mg
  Filled 2016-07-15: qty 2

## 2016-07-15 MED ORDER — ETOMIDATE 2 MG/ML IV SOLN
20.0000 mg | Freq: Once | INTRAVENOUS | Status: AC
  Administered 2016-07-15: 20 mg via INTRAVENOUS

## 2016-07-15 MED ORDER — FENTANYL CITRATE (PF) 100 MCG/2ML IJ SOLN
INTRAMUSCULAR | Status: AC
  Administered 2016-07-15: 100 ug
  Filled 2016-07-15: qty 2

## 2016-07-15 MED ORDER — AMLODIPINE BESYLATE 5 MG PO TABS
5.0000 mg | ORAL_TABLET | Freq: Every day | ORAL | Status: DC
  Administered 2016-07-16 – 2016-07-21 (×6): 5 mg
  Filled 2016-07-15 (×6): qty 1

## 2016-07-15 MED ORDER — LORAZEPAM 2 MG/ML IJ SOLN
0.5000 mg | INTRAMUSCULAR | Status: DC | PRN
  Administered 2016-07-15: 0.5 mg via INTRAVENOUS

## 2016-07-15 MED ORDER — ROCURONIUM BROMIDE 50 MG/5ML IV SOLN
1.0000 mg/kg | Freq: Once | INTRAVENOUS | Status: AC
  Administered 2016-07-15: 60 mg via INTRAVENOUS

## 2016-07-15 MED ORDER — IPRATROPIUM-ALBUTEROL 0.5-2.5 (3) MG/3ML IN SOLN
3.0000 mL | Freq: Four times a day (QID) | RESPIRATORY_TRACT | Status: DC
  Administered 2016-07-15 – 2016-07-22 (×31): 3 mL via RESPIRATORY_TRACT
  Filled 2016-07-15 (×23): qty 3
  Filled 2016-07-15: qty 30
  Filled 2016-07-15 (×6): qty 3

## 2016-07-15 MED ORDER — IPRATROPIUM-ALBUTEROL 0.5-2.5 (3) MG/3ML IN SOLN
RESPIRATORY_TRACT | Status: AC
  Administered 2016-07-15: 3 mL via RESPIRATORY_TRACT
  Filled 2016-07-15: qty 3

## 2016-07-15 MED ORDER — RACEPINEPHRINE HCL 2.25 % IN NEBU
INHALATION_SOLUTION | RESPIRATORY_TRACT | Status: AC
  Administered 2016-07-15: 0.5 mL via RESPIRATORY_TRACT
  Filled 2016-07-15: qty 0.5

## 2016-07-15 NOTE — Procedures (Signed)
Intubation Procedure Note [name] [id number] [birth year]  Procedure: Intubation Indications: Respiratory insufficiency  Procedure Details Consent: Risks of procedure as well as the alternatives and risks of each were explained to the (patient/caregiver).  Consent for procedure obtained. Time Out: Verified patient identification, verified procedure, site/side was marked, verified correct patient position, special equipment/implants available, medications/allergies/relevent history reviewed, required imaging and test results available.  Performed  Maximum sterile technique was used including cap, gloves, hand hygiene and mask.  MAC and 4 (glide scope)  Glide scope view: grade 3, had good view of cords, Cords however were edematous and almost closed shut. We were not able to pass # 7. Could easily get the ett to the cords but could not pass it. At that point she desaturated briefly to as low as 78% (we were able to ambu bag her back to 100% w/in less than 30 seconds). At that point we decided to switch to 6.5 tube. With effort we were able to pass this over the glide scope stilet.   Evaluation Hemodynamic Status: BP stable throughout; O2 sats: stable throughout Patient's Current Condition: stable Complications: No apparent complications Patient did tolerate procedure well. Chest X-ray ordered to verify placement.  CXR: pending. Suspect that she will need trach.  Clementeen Graham 07/15/2016

## 2016-07-15 NOTE — Progress Notes (Signed)
SUBJECTIVE: The patient is intubated and sedated.  Still receiving fentanyl.  No new events  CURRENT MEDICATIONS: . amLODipine  5 mg Oral Daily  . antiseptic oral rinse  7 mL Mouth Rinse 10 times per day  . chlorhexidine gluconate (SAGE KIT)  15 mL Mouth Rinse BID  . famotidine (PEPCID) IV  20 mg Intravenous Q12H  . insulin aspart  0-15 Units Subcutaneous Q4H  . predniSONE  5 mg Oral Q breakfast   . sodium chloride 10 mL/hr at 07/14/16 0800  . feeding supplement (VITAL HIGH PROTEIN) 1,000 mL (07/15/16 0145)  . fentaNYL infusion INTRAVENOUS 75 mcg/hr (07/14/16 2300)  . midazolam (VERSED) infusion Stopped (07/14/16 0844)    OBJECTIVE: Physical Exam: Vitals:   07/15/16 0420 07/15/16 0500 07/15/16 0600 07/15/16 0700  BP:  123/78 136/89 120/76  Pulse:  74 75 75  Resp:  '16 16 17  ' Temp:      TempSrc:      SpO2:  100% 100% 100%  Weight: 178 lb 2.1 oz (80.8 kg)     Height:        Intake/Output Summary (Last 24 hours) at 07/15/16 0737 Last data filed at 07/15/16 0700  Gross per 24 hour  Intake             1914 ml  Output             2345 ml  Net             -431 ml    Telemetry reveals atrial fibrillation with V pacing, occasional PVC's   GEN- The patient is intubated, will open eyes to voice and follow commands Head- normocephalic, atraumatic Eyes-  Sclera clear, conjunctiva pink Ears- hearing intact Oropharynx- +ETT Neck- supple  Lungs- Clear to ausculation bilaterally, normal work of breathing Heart- Regular rate and rhythm (paced) GI- soft, NT, ND, + BS Extremities- no clubbing, cyanosis, or edema Skin- no rash or lesion    LABS: Basic Metabolic Panel:  Recent Labs  07/12/16 1830  07/14/16 0218 07/15/16 0220  NA  --   < > 141 141  K  --   < > 4.6 4.5  CL  --   < > 105 103  CO2  --   < > 31 32  GLUCOSE  --   < > 159* 123*  BUN  --   < > 53* 44*  CREATININE  --   < > 1.24* 1.06*  CALCIUM  --   < > 9.8 10.0  MG 2.6*  --   --  2.1  PHOS 3.2  --   --   3.2  < > = values in this interval not displayed. CBC:  Recent Labs  07/14/16 0218 07/15/16 0220  WBC 7.4 8.1  HGB 8.7* 9.1*  HCT 30.4* 31.2*  MCV 88.1 87.4  PLT 193 215    RADIOLOGY: Ct Head Wo Contrast Result Date: 07/10/2016 CLINICAL DATA:  Altered mental status. Transesophageal echocardiogram today with difficult intubation and hypotension EXAM: CT HEAD WITHOUT CONTRAST TECHNIQUE: Contiguous axial images were obtained from the base of the skull through the vertex without intravenous contrast. COMPARISON:  CT head 10/05/2011 FINDINGS: Ventricle size normal.  Cerebral volume normal. Negative for acute infarct. Negative for hemorrhage or mass. No fluid collection or shift of the midline structures. Mucosal edema throughout the paranasal sinuses. Negative calvarium IMPRESSION: No acute intracranial abnormality. If there is concern of anoxic injury, follow-up CT may be helpful as ischemia may  be missed on early CT. Electronically Signed   By: Franchot Gallo M.D.   On: 07/10/2016 15:42  Ct Soft Tissue Neck Wo Contrast Result Date: 07/10/2016 CLINICAL DATA:  Transesophageal echo cardiogram today with difficult intubation. Hypotension. Rule out esophageal injury. EXAM: CT NECK WITHOUT CONTRAST TECHNIQUE: Multidetector CT imaging of the neck was performed following the standard protocol without intravenous contrast. COMPARISON:  None. FINDINGS: Pharynx and larynx: The patient is intubated. Pharyngeal soft tissues are collapsed around endotracheal tube and there may be some pharyngeal edema. Endotracheal tube in good position in the trachea above the carina. No focal hematoma. No soft tissue gas is seen that would suggests pharyngeal or esophageal perforation. No mass-effect or shift of the midline structures. Salivary glands: Parotid and submandibular glands normal bilaterally. Thyroid: Negative Lymph nodes: Negative for cervical lymphadenopathy. Vascular: Patency of vascular structures not assessed  without intravenous contrast. Limited intracranial: Negative Visualized orbits: Negative Mastoids and visualized paranasal sinuses: Extensive mucosal edema throughout the paranasal sinuses bilaterally with near complete opacification of the maxillary sinus bilaterally Skeleton: Mild degenerative changes in the cervical spine. No acute skeletal abnormality. Dental disease. Upper chest: Mild atelectasis in the right upper lobe.  No effusion. IMPRESSION: Endotracheal tube in good position. The pharynx is collapsed around endotracheal tube and there may be some pharyngeal edema. No hematoma. No gas in the soft tissues of the pharynx or proximal esophagus that would suggest acute injury. Negative for mass or adenopathy Extensive mucosal edema paranasal sinuses Right upper lobe atelectasis. Electronically Signed   By: Franchot Gallo M.D.   On: 07/10/2016 15:40  Dg Chest Port 1 View Result Date: 07/11/2016 CLINICAL DATA:  Respiratory failure. EXAM: PORTABLE CHEST 1 VIEW COMPARISON:  07/10/2016. FINDINGS: Endotracheal tube, NG tube in stable position. Cardiac pacer in stable position. Stable cardiomegaly. Low lung volumes with mild bibasilar subsegmental atelectasis and or infiltrates/edema. No pleural effusion or pneumothorax. IMPRESSION: 1.  Lines and tubes in stable position. 2. Low lung volumes with mild bibasilar atelectasis and/or infiltrates/edema. 3. Cardiac pacer stable position.  Stable cardiomegaly. Electronically Signed   By: Marcello Moores  Register   On: 07/11/2016 06:59   ASSESSMENT AND PLAN:  1.  Acute respiratory failure/hypotension BP stable overnight Vent management per CCM   2.  Permanent atrial fibrillation No OAC 2/2 anemia With difficulty with sedation and intubation during TEE, she is at increased risk for procedures going forward, would be hesitant to pursue watchman in the future  3.  Complete heart block Normal PPM function this admission  4.  Chronic renal failure Stable  5.   Anemia Stable today   6.  Concern for AMS Seen by neurology.  No focal findings today.  I suspect that she will be fine once sedation is off. Hopefully we can stop fentanyl and extubate soon.  proph- would consider heparin DVT prophylaxis.  Will defer to PCCM.  Hopefully we can extubate and mobilize soon.  Thompson Grayer MD, [facility] 07/15/2016 7:42 AM

## 2016-07-15 NOTE — Progress Notes (Signed)
RT transported patient to CT and back without any complications. 

## 2016-07-15 NOTE — Progress Notes (Signed)
Paged MD Mannam concerning pts labored breathing and audible stridor. MD is coming to assess pt.

## 2016-07-15 NOTE — Progress Notes (Signed)
30 ml of Versed wasted in sink with Leland Johns, RN at [employer]

## 2016-07-15 NOTE — Progress Notes (Signed)
PULMONARY / CRITICAL CARE MEDICINE   Name: [name] MRN: [id number] DOB: [birth year]    ADMISSION DATE:  07/10/2016 CONSULTATION DATE:  7/27  REFERRING MD:  Allred   CHIEF COMPLAINT:  Respiratory failure   HISTORY OF PRESENT ILLNESS:   70yo female former smoker, polymyalgia rheumatica, HTN, hypertrophic cardiomyopathy, CKD, AFib not candidate for anticoagulation r/t prior GI bleeding who was admitted 7/27 for elective TEE to evaluate L atrial appendage and place watchman device (left atrial appendage closure system for long term stroke prevention).  Pt was anemic on admit and decision was made to not proceed with watchman device (as it would require asa, coumadin for short time afterwards) but TEE was done to eval function and anatomy.  MD had significant difficulty passing the probe, which was eventually passed by anesthesia.  TEE showed ?stunned LV with severe hypokinesis and EF 20-25% and non obstructive HOCM.  However she had significant hypotension and increasing respiratory distress so she was intubated, started on pressors and tx to ICU post procedure.    SUBJECTIVE: Doing OK on weaning trial today. Awake, more interacitve  VITAL SIGNS: BP (!) 106/95   Pulse 75   Temp 98.8 F (37.1 C) (Axillary)   Resp 16   Ht 5\' 5"  (1.651 m)   Wt 178 lb 2.1 oz (80.8 kg)   SpO2 99%   BMI 29.64 kg/m   HEMODYNAMICS:   VENTILATOR SETTINGS: Vent Mode: CPAP;PSV FiO2 (%):  [40 %] 40 % Set Rate:  [16 bmp] 16 bmp Vt Set:  [450 mL] 450 mL PEEP:  [5 cmH20] 5 cmH20 Pressure Support:  [5 cmH20] 5 cmH20 Plateau Pressure:  [15 cmH20-19 cmH20] 19 cmH20  INTAKE / OUTPUT: I/O last 3 completed shifts: In: 2904.3 [I.V.:600.6; GW:[id number]; IV Piggyback:360] Out: [id number] [Urine:3295]  PHYSICAL EXAMINATION:  General:  No distress on vent Neuro: opens eyes to name, Follows commands HEENT:  ETT in place, moist mucus membranes Cardiovascular:  S1, S2, RRR, no MRGs Lungs:  Clear, no wheeze or  crackles  Abdomen:  Soft, + BS  Musculoskeletal:  Warm and dry, +1 edema   LABS:  BMET  Recent Labs Lab 07/13/16 0349 07/14/16 0218 07/15/16 0220  NA 140 141 141  K 5.4* 4.6 4.5  CL 109 105 103  CO2 26 31 32  BUN 47* 53* 44*  CREATININE 1.38* 1.24* 1.06*  GLUCOSE 194* 159* 123*    Electrolytes  Recent Labs Lab 07/12/16 0533  07/12/16 1830 07/13/16 0349 07/14/16 0218 07/15/16 0220  CALCIUM  --   < >  --  9.5 9.8 10.0  MG 2.4  --  2.6*  --   --  2.1  PHOS 4.1  --  3.2  --   --  3.2  < > = values in this interval not displayed.  CBC  Recent Labs Lab 07/13/16 0349 07/14/16 0218 07/15/16 0220  WBC 8.5 7.4 8.1  HGB 8.6* 8.7* 9.1*  HCT 30.3* 30.4* 31.2*  PLT 200 193 215    Coag's No results for input(s): APTT, INR in the last 168 hours.  Sepsis Markers  Recent Labs Lab 07/10/16 1703  LATICACIDVEN 1.1    ABG  Recent Labs Lab 07/10/16 1702 07/11/16 0350  PHART 7.318* 7.302*  PCO2ART 56.2* 51.3*  PO2ART 496.0* 156*    Liver Enzymes No results for input(s): AST, ALT, ALKPHOS, BILITOT, ALBUMIN in the last 168 hours.  Cardiac Enzymes No results for input(s): TROPONINI, PROBNP in the last 168 hours.  Glucose  Recent Labs Lab 07/14/16 1224 07/14/16 1550 07/14/16 2038 07/15/16 0008 07/15/16 0425 07/15/16 0810  GLUCAP 151* 167* 156* 137* 130* 183*    Imaging Ct Head Wo Contrast  Result Date: 07/15/2016 CLINICAL DATA:  Follow-up evaluation. RIGHT arm weakness. History of chronic renal failure, hyperlipidemia, hypertension. EXAM: CT HEAD WITHOUT CONTRAST TECHNIQUE: Contiguous axial images were obtained from the base of the skull through the vertex without intravenous contrast. COMPARISON:  CT HEAD July 13, 2016 in CT HEAD October 05, 2011. FINDINGS: INTRACRANIAL CONTENTS: The ventricles and sulci are normal for age. No intraparenchymal hemorrhage, mass effect nor midline shift. Patchy supratentorial white matter hypodensities are less than  expected for patient's age and though non-specific likely represent chronic small vessel ischemic disease. No acute large vascular territory infarcts. No abnormal extra-axial fluid collections. Basal cisterns are patent. Mild calcific atherosclerosis of the carotid siphons. ORBITS: The included ocular globes and orbital contents are non-suspicious. SINUSES: Lobulated maxillary sinusitis without antral expansion. Minimal LEFT mastoid effusion. SKULL/SOFT TISSUES: No skull fracture. No significant soft tissue swelling. Life-support lines in place. Stable 7 mm sclerotic focus LEFT clivus. IMPRESSION: Negative CT HEAD for age. Electronically Signed   By: Elon Alas M.D.   On: 07/15/2016 02:06   Dg Chest Port 1 View  Result Date: 07/15/2016 CLINICAL DATA:  Acute respiratory failure. EXAM: PORTABLE CHEST 1 VIEW COMPARISON:  07/14/2016 FINDINGS: Endotracheal tube terminates at the clavicular heads, unchanged. Enteric tube courses into the left upper abdomen with tip not imaged. Cardiac silhouette remains enlarged. Lung volumes are mildly diminished with new mild left basilar opacity which likely reflects atelectasis. There is minimal right basilar atelectasis which is slightly improved. No sizable pleural effusion or pneumothorax is identified. IMPRESSION: Bibasilar atelectasis. Electronically Signed   By: Logan Bores M.D.   On: 07/15/2016 07:32   STUDIES:  TEE 7/27 >>  ?stunned LV with severe hypokinesis and EF 20-25% and non obstructive HOCM 2D echo 7/28 >> LVEF 55-60%, moderate asymmetric septal hypertrophy, intermittent diastolic dysfunction (AF), no RWMA, PA peak 62  CT soft tissue neck 7/27 >> pharynx is collapsed around ETT, ? Pharyngeal edema, no hematoma, no gas in soft tissue, extensive mucosal edema in paranasal sinuses, RUL atelectasis CT Head 7/27 >> no acute abnormality  CULTURES: MRSA 7/27 (-) Trach asp 7/27 >>  Blood 7/27 >>  ANTIBIOTICS: None  SIGNIFICANT EVENTS: 7/27 TEE. Went  into resp distress and hypotension during TEE. Admit 2H  LINES/TUBES: ETT 7/27 >> R rad aline 7/27 >> 7/29  DISCUSSION: 70yo female with persistent AFib unable to be anticoagulated r/t previous GI bleeding and known hypertrophic cardiomyopathy admitted after hypotension and resp fx during TEE.  She is doing OK on weaning trial but still with no appreciable cuff leak. She was a difficult intubation with airway edema. She has already received 3 days of IV steroids. We will go ahead with extubation today but will need to be prepared to reintubate if she runs into trouble.   ASSESSMENT / PLAN:  PULMONARY Acute hypoxemic hypercapneic respiratory failure secondary to inability to protect airway. Swollen airways after TEE.  Pt with very difficult airway and has a size 7 ETT.  COPD - by history but she is not on meds and spirometry did not reveal COPD. Doubt she has an exacerbation now Likely untreated OSA / OHS Elevated PA Pressures on ECHO.  P:   CT soft tissue neck was negative for infection but has pharyngeal/airway edema. Extubate today.  CARDIOVASCULAR Stunned LV with  significant hypokinesis EF 20-25%. S/P TEE 7/27 Chronic AFib - paced, not candidate for anticoagulation r/t previous GI bleeding  Complete heart block  P:  Cardiology following No anticoagulation due to GI bleed  Tele monitoring  RENAL CKD IIIB P:   Trend BMP / UOP  KVO IVF  GASTROINTESTINAL Hx GI bleed - no current s/s bleeding  P:   PPI  Monitor Hgb TF per nutrition  HEMATOLOGIC Anemia  P:  Trend CBC SCD's for DVT prophylaxis   INFECTIOUS No active issue  P:   Monitor fever, WBC curve off abx   ENDOCRINE Chronic steroids (on 5 mg of pred daily) Hx polymyalgia rheumatica  P:   S/p Decadron X 3 days for airway edema Cont maintenance prednisone 5mg /day  SSI while on steroids  NEUROLOGIC Concern for seizures Neurology consulted. Unable to get MRI brain because of pacemaker P:   RASS  goal: 0 Off AEDs as there is not more evidence of seizure activity Wean off sedation today  FAMILY  - Updates:  Family updated 8/1. - Inter-disciplinary family meet or Palliative Care meeting due by:  8/3.  Critical care time- 35 mins.  Marshell Garfinkel MD [facility] Pager [phone] If no answer or after 3pm call: [phone] 07/15/2016, 8:40 AM   Addendum: Developed mild stridor post extubation but no increased WOB.   Will resume IV steroids.  Epi neb now Start Duonebs. Continue to monitor in ICU.

## 2016-07-15 NOTE — Procedures (Signed)
Extubation Procedure Note  Patient Details:   Name: [name] DOB: 10-02-1946 MRN: [id number]   Airway Documentation:  Airway 7 mm (Active)  Secured at (cm) 22 cm 07/15/2016  7:53 AM  Measured From Lips 07/15/2016  7:53 AM  Secured Location Left 07/15/2016  7:53 AM  Secured By [employer] 07/15/2016  7:53 AM  Tube Holder Repositioned Yes 07/15/2016  7:53 AM  Cuff Pressure (cm H2O) 26 cm H2O 07/15/2016  7:53 AM  Site Condition Dry 07/15/2016  7:53 AM    Evaluation  O2 sats: stable throughout Complications: Complications of strider Patient did not tolerate procedure well. Bilateral Breath Sounds: Rales, Diminished   No  MD aware and at bedside. Patient given racimic epi post extubation. Placed on a 4L nasal canula. Oriented to place.  [name] 07/15/2016, 8:49 AM

## 2016-07-15 NOTE — Progress Notes (Signed)
Informed MD on ICU that pt had inc SOB, Stridor and wheezing bilaterally. MD assessed pt at bedside and ordered IV ativan. Md wants to continue Bipap at this and Rn will continue to monitor pt.

## 2016-07-15 NOTE — Progress Notes (Signed)
Neurology Progress Note  Subjective: No major overnight events. She remains intubated with fentanyl drip running at 75 mcg/hr at the time of my visit. Her midazolam infusion was turned off at 0844 on 7/31. She is able to nod appropriately to questions and indicates no complaints at this time.   Current Meds:   Current Facility-Administered Medications:  .  0.9 %  sodium chloride infusion, , Intravenous, Continuous, Donita Brooks, NP, Last Rate: 10 mL/hr at 07/14/16 0800 .  albuterol (PROVENTIL) (2.5 MG/3ML) 0.083% nebulizer solution 2.5 mg, 2.5 mg, Nebulization, Q3H PRN, Marijean Heath, NP .  amLODipine (NORVASC) tablet 5 mg, 5 mg, Oral, Daily, Jolaine Artist, MD, 5 mg at 07/14/16 0937 .  antiseptic oral rinse solution (CORINZ), 7 mL, Mouth Rinse, 10 times per day, Thompson Grayer, MD, 7 mL at 07/15/16 0600 .  chlorhexidine gluconate (SAGE KIT) (PERIDEX) 0.12 % solution 15 mL, 15 mL, Mouth Rinse, BID, Thompson Grayer, MD, 15 mL at 07/14/16 1946 .  famotidine (PEPCID) IVPB 20 mg premix, 20 mg, Intravenous, Q12H, Marijean Heath, NP, 20 mg at 07/14/16 2220 .  feeding supplement (VITAL HIGH PROTEIN) liquid 1,000 mL, 1,000 mL, Per Tube, Continuous, Jose Shirl Harris, MD, Last Rate: 55 mL/hr at 07/15/16 0145, 1,000 mL at 07/15/16 0145 .  fentaNYL (SUBLIMAZE) 2,500 mcg in sodium chloride 0.9 % 250 mL (10 mcg/mL) infusion, 25-200 mcg/hr, Intravenous, Continuous, Donita Brooks, NP, Last Rate: 7.5 mL/hr at 07/14/16 2300, 75 mcg/hr at 07/14/16 2300 .  fentaNYL (SUBLIMAZE) bolus via infusion 25 mcg, 25 mcg, Intravenous, Q1H PRN, Marijean Heath, NP, 25 mcg at 07/14/16 0130 .  hydrALAZINE (APRESOLINE) injection 10 mg, 10 mg, Intravenous, Q2H PRN, Jolaine Artist, MD, 10 mg at 07/14/16 2312 .  insulin aspart (novoLOG) injection 0-15 Units, 0-15 Units, Subcutaneous, Q4H, Donita Brooks, NP, 2 Units at 07/15/16 0426 .  magnesium hydroxide (MILK OF MAGNESIA) suspension 15 mL, 15 mL, Oral,  Daily PRN, Jose Angelo A de Larkin Ina, MD .  midazolam (VERSED) 50 mg in sodium chloride 0.9 % 50 mL (1 mg/mL) infusion, 2 mg/hr, Intravenous, Continuous, Raylene Miyamoto, MD, Stopped at 07/14/16 [phone] .  midazolam (VERSED) injection 1 mg, 1 mg, Intravenous, Q2H PRN, Marijean Heath, NP, 1 mg at 07/13/16 1023 .  ondansetron (ZOFRAN) injection 4 mg, 4 mg, Intravenous, Q6H PRN, Patsey Berthold, NP .  predniSONE (DELTASONE) tablet 5 mg, 5 mg, Oral, Q breakfast, Praveen Mannam, MD, 5 mg at 07/14/16 0938  Objective:  Temp:  [97.5 F (36.4 C)-99.6 F (37.6 C)] 98.8 F (37.1 C) (08/01 0400) Pulse Rate:  [73-80] 75 (08/01 0700) Resp:  [13-25] 17 (08/01 0700) BP: (120-179)/(51-96) 120/76 (08/01 0700) SpO2:  [100 %] 100 % (08/01 0700) FiO2 (%):  [40 %] 40 % (08/01 0400) Weight:  [80.8 kg (178 lb 2.1 oz)] 80.8 kg (178 lb 2.1 oz) (08/01 0420)  General: WDWN AA woman lying in bed in NAD. She is intubated on a fentanyl infusion. She is alert and able to follow simple midline and appendicular commands.  HEENT: Neck is supple without lymphadenopathy. ETT in place. Sclerae are anicteric. There is no conjunctival injection.  CV: Regular, no murmur. Carotid pulses are 2+ and symmetric with no bruits. Distal pulses 2+ and symmetric.  Lungs: CTAB on anterior exam. Extremities: No C/C/E. Neuro: MS: As noted above. No aphasia.  CN: Pupils are equal and reactive from 3-->2 mm bilaterally. EOMI, no nystagmus. Facial sensation appears  intact to light touch. Face is symmetric at rest with normal strength and mobility. Hearing is intact to conversational voice. The remainder of her cranial nerves cannot be accurately assessed as she is intubated.  Motor: Normal bulk. Tone is mildly diminished. Exam is limited by sedation and effort but she is able to move both arms and wiggle the toes of both feet to command.  No tremor or other abnormal movements are observed.  Sensation: Appears intact to light touch. DTRs: 2+,  symmetric. Toes are downgoing bilaterally. No pathological reflexes.  Coordination and gait: Deferred at this time as she is intubated and sedated.   Labs: Lab Results  Component Value Date   WBC 8.1 07/15/2016   HGB 9.1 (L) 07/15/2016   HCT 31.2 (L) 07/15/2016   PLT 215 07/15/2016   GLUCOSE 123 (H) 07/15/2016   CHOL 122 03/28/2015   TRIG 86 03/28/2015   HDL 42 (L) 03/28/2015   LDLDIRECT 161.8 02/21/2013   LDLCALC 63 03/28/2015   ALT 11 (L) 02/06/2016   AST 20 02/06/2016   NA 141 07/15/2016   K 4.5 07/15/2016   CL 103 07/15/2016   CREATININE 1.06 (H) 07/15/2016   BUN 44 (H) 07/15/2016   CO2 32 07/15/2016   TSH 0.76 04/05/2015   INR 0.94 05/03/2012   HGBA1C 6.0 11/25/2013   CBC Latest Ref Rng & Units 07/15/2016 07/14/2016 07/13/2016  WBC 4.0 - 10.5 K/uL 8.1 7.4 8.5  Hemoglobin 12.0 - 15.0 g/dL 9.1(L) 8.7(L) 8.6(L)  Hematocrit 36.0 - 46.0 % 31.2(L) 30.4(L) 30.3(L)  Platelets 150 - 400 K/uL 215 193 200    Lab Results  Component Value Date   HGBA1C 6.0 11/25/2013   Lab Results  Component Value Date   ALT 11 (L) 02/06/2016   AST 20 02/06/2016   ALKPHOS 33 (L) 02/06/2016   BILITOT 0.2 (L) 02/06/2016    Radiology: I have personally and independently reveiwed the [facility] without contrast from 07/15/16. This shows no acute abnormality.   A/P:   1. Possible seizure: I do not think that she had seizure based upon available information. EEG did not reveal any epileptiform abnormality. CTH has been unremarkable for any acute pathology x2. Hold AED therapy. Wean sedation and extubate as tolerated.   2. Possible RUE weakness: This was transient. I do not find any focality today. Her exam can be followed off of sedation. Repeat CTH did not reveal any acute or subacute stroke or other pathology of concern. She will likely need PT and OT for deconditioning once extubated.   No family present at the bedside. Case discussed with Dr. Rayann Heman at the time of my visit.   Melba Coon,  MD [facility]

## 2016-07-16 ENCOUNTER — Inpatient Hospital Stay (HOSPITAL_COMMUNITY): Payer: Medicare Other

## 2016-07-16 LAB — BASIC METABOLIC PANEL
Anion gap: 9 (ref 5–15)
BUN: 41 mg/dL — AB (ref 6–20)
CO2: 28 mmol/L (ref 22–32)
CREATININE: 1.12 mg/dL — AB (ref 0.44–1.00)
Calcium: 9.8 mg/dL (ref 8.9–10.3)
Chloride: 104 mmol/L (ref 101–111)
GFR, EST AFRICAN AMERICAN: 55 mL/min — AB (ref >60)
GFR, EST NON AFRICAN AMERICAN: 48 mL/min — AB (ref >60)
Glucose, Bld: 223 mg/dL — ABNORMAL HIGH (ref 65–99)
POTASSIUM: 4.5 mmol/L (ref 3.5–5.1)
SODIUM: 141 mmol/L (ref 135–145)

## 2016-07-16 LAB — GLUCOSE, CAPILLARY
GLUCOSE-CAPILLARY: 245 mg/dL — AB (ref 65–99)
GLUCOSE-CAPILLARY: 231 mg/dL — AB (ref 65–99)
GLUCOSE-CAPILLARY: 218 mg/dL — AB (ref 65–99)
GLUCOSE-CAPILLARY: 194 mg/dL — AB (ref 65–99)
GLUCOSE-CAPILLARY: 204 mg/dL — AB (ref 65–99)
Glucose-Capillary: 249 mg/dL — ABNORMAL HIGH (ref 65–99)

## 2016-07-16 LAB — CBC
HCT: 30.6 % — ABNORMAL LOW (ref 36.0–46.0)
HEMOGLOBIN: 9 g/dL — AB (ref 12.0–15.0)
MCH: 25.7 pg — AB (ref 26.0–34.0)
MCHC: 29.4 g/dL — ABNORMAL LOW (ref 30.0–36.0)
MCV: 87.4 fL (ref 78.0–100.0)
PLATELETS: 196 10*3/uL (ref 150–400)
RBC: 3.5 MIL/uL — AB (ref 3.87–5.11)
RDW: 15.8 % — ABNORMAL HIGH (ref 11.5–15.5)
WBC: 8 10*3/uL (ref 4.0–10.5)

## 2016-07-16 MED ORDER — IOPAMIDOL (ISOVUE-300) INJECTION 61%
INTRAVENOUS | Status: AC
  Filled 2016-07-16: qty 75

## 2016-07-16 MED ORDER — LIDOCAINE VISCOUS 2 % MT SOLN
15.0000 mL | Freq: Once | OROMUCOSAL | Status: DC
  Filled 2016-07-16: qty 15

## 2016-07-16 MED ORDER — BISACODYL 5 MG PO TBEC: 5.0000 mg | DELAYED_RELEASE_TABLET | Freq: Every day | ORAL | Status: DC | PRN

## 2016-07-16 MED ORDER — HEPARIN SODIUM (PORCINE) 5000 UNIT/ML IJ SOLN: 5000.0000 [IU] | Freq: Three times a day (TID) | INTRAMUSCULAR | Status: DC

## 2016-07-16 MED ORDER — DOCUSATE SODIUM 50 MG/5ML PO LIQD
100.0000 mg | Freq: Every day | ORAL | Status: DC
  Administered 2016-07-16 – 2016-07-18 (×3): 100 mg via ORAL
  Filled 2016-07-16 (×5): qty 10

## 2016-07-16 MED ORDER — PANTOPRAZOLE SODIUM 40 MG PO PACK
40.0000 mg | PACK | Freq: Two times a day (BID) | ORAL | Status: DC
  Administered 2016-07-17 – 2016-07-21 (×9): 40 mg
  Filled 2016-07-16 (×9): qty 20

## 2016-07-16 MED ORDER — ENOXAPARIN SODIUM 40 MG/0.4ML ~~LOC~~ SOLN
40.0000 mg | SUBCUTANEOUS | Status: DC
  Administered 2016-07-16 – 2016-07-23 (×8): 40 mg via SUBCUTANEOUS
  Filled 2016-07-16 (×8): qty 0.4

## 2016-07-16 NOTE — Progress Notes (Signed)
I spoke with Dr Erik Obey (ENT) regarding the patient.  He will arrange for ENT consultation today to assist [place] in managing her very complex airway situation.  Given recent trauma with TEE I think that an ENT assessment is appropriate.  Thompson Grayer MD, [facility] 07/16/2016 12:56 PM

## 2016-07-16 NOTE — Consult Note (Signed)
[name], [name] 70 y.o., female [id number]     Chief Complaint: laryngeal edema  HPI: 70 yo bf with complex medical situation had difficult passage of TEE 7 days ago.  Required conversion to ETT intubation and full general anesthesia.  Yesterday underwent weaning trial and extubation with decompensation and re-intubation yesterday afternoon with some difficulty.  ENT called for assistance with airway management.  She is on Pepcid for known GERD.  Received 3 days of IV Decadron.  PMH: Past Medical History:  Diagnosis Date  . Anemia   . Angina   . Angioedema    2/2 ACE  . Arteriovenous malformation of stomach   . Arthritis   . Asthma   . AVM (arteriovenous malformation) of colon    small intestine; stomach  . Blood transfusion   . CHF (congestive heart failure) ([place])   . Complete heart block ([place])    s/p PPM 1998  . DDD (degenerative disc disease)   . Depression   . Diastolic heart failure   . Fatty liver 07/26/10  . GERD (gastroesophageal reflux disease)   . GI bleed   . History of alcohol abuse Stopped Fall 2012  . History of tobacco use Quit Fall 2012  . Hx of cardiovascular stress test    a. Lexiscan Myoview (10/15):  Small inferolateral and apical defect c/w scar and poss soft tissue attenuation, no ischemia, EF 42%  . Hx of colonic polyp 08/13/10   adenomatous  . Hx of colonoscopy   . Hyperlipidemia   . Hyperlipidemia   . Hypertension   . Hypertrophic cardiomyopathy ([name])    dx by Dr [name] 2009  . Iron deficiency anemia   . Panic attack   . Panic attacks   . Permanent atrial fibrillation ([place])   . Renal failure    baseline creatinine 1.6  . Right arm pain 01/08/2012  . RLS (restless legs syndrome)    Dx 06/2007  . Shortness of breath    sob on exertation  . Sleep apnea     Surg Hx: Past Surgical History:  Procedure Laterality Date  . CARDIAC CATHETERIZATION    . ESOPHAGOGASTRODUODENOSCOPY  12/23/2011   Procedure: ESOPHAGOGASTRODUODENOSCOPY (EGD);   Surgeon: [name], MD;  Location: [facility];  Service: Endoscopy;  Laterality: N/A;  . GIVENS CAPSULE STUDY  12/23/2011   Procedure: GIVENS CAPSULE STUDY;  Surgeon: [name], MD;  Location: [facility];  Service: Endoscopy;  Laterality: N/A;  . PACEMAKER INSERTION  1998   st jude, most recent gen change by [name] 4/12  . TUBAL LIGATION  04/01/2000    FHx:   Family History  Problem Relation Age of Onset  . Hypertension Mother   . Stroke Mother   . Hypertension Father   . Stroke Father   . Hypertension Sister   . Hypertension Brother   . Colon cancer Brother   . Diabetes Sister   . Diabetes Brother   . Cancer Brother   . Heart attack Brother   . Heart attack Father   . Heart disease Mother   . Heart disease Sister   . Alcohol abuse Brother   . Alcohol abuse Father   . Aneurysm Mother   . CVA Mother    SocHx:  reports that she quit smoking about 4 years ago. Her smoking use included Cigarettes. She has a 5.00 pack-year smoking history. She has never used smokeless tobacco. She reports that she drinks alcohol. She reports that she does not use  drugs.  ALLERGIES:  Allergies  Allergen Reactions  . Ace Inhibitors Other (See Comments)    Angioedema  . Propofol Anaphylaxis and Swelling    Facility-Administered Medications Prior to Admission  Medication Dose Route Frequency Provider Last Rate Last Dose  . cyanocobalamin ((VITAMIN B-12)) injection 1,000 mcg  1,000 mcg Intramuscular Q30 days [name], MD   1,000 mcg at 09/16/11 1341   Medications Prior to Admission  Medication Sig Dispense Refill  . acetaminophen (TYLENOL) 325 MG tablet Take 325 mg by mouth daily as needed (pain).    [name] ALPRAZolam (XANAX) 0.5 MG tablet TAKE 1 TABLET BY MOUTH AS NEEDED FOR ANXIETY/ SLEEP 15 tablet 0  . aspirin EC 81 MG tablet Take 81 mg by mouth daily.     [name] CALCIUM-VITAMIN D PO Take 1 tablet by mouth every 14 (fourteen) days.     . carvedilol (COREG) 12.5 MG tablet TAKE 1 TABLET(12.5  MG) BY MOUTH TWICE DAILY 60 tablet 3  . Coenzyme Q-10 200 MG CAPS Take 1 capsule by mouth as directed.     [name] esomeprazole (NEXIUM) 40 MG capsule Take 40 mg by mouth daily.     . ferrous sulfate 325 (65 FE) MG tablet TAKE 1 TABLET BY MOUTH TWICE DAILY WITH MEALS TO KEEP BLOOD COUNT UP 60 tablet 11  . furosemide (LASIX) 20 MG tablet Take 1 tablet by mouth on Tues, Thurs, Sat and Sun and take 2 tablets by mouth on Mon, Wed, Fri 45 tablet 11  . Multiple Vitamin (MULTIVITAMIN) capsule Take 1 capsule by mouth daily.    . predniSONE (DELTASONE) 1 MG tablet Take 5 tablets by mouth daily with breakfast 150 tablet 0  . PROAIR HFA 108 (90 Base) MCG/ACT inhaler INHALE 2 PUFFS INTO THE LUNGS EVERY 6 HOURS AS NEEDED FOR WHEEZING OR SHORTNESS OF BREATH 8.5 g 3  . rosuvastatin (CRESTOR) 20 MG tablet TAKE 1 TABLET BY MOUTH DAILY ON MONDAY, WEDNESDAY, AND FRIDAY 38 tablet 3  . senna (SENOKOT) 8.6 MG tablet Take 1 tablet by mouth daily as needed for constipation.     . sucralfate (CARAFATE) 1 g tablet Take 1 g by mouth 2 (two) times daily.     . nitroGLYCERIN (NITROSTAT) 0.4 MG SL tablet Place 1 tablet (0.4 mg total) under the tongue every 5 (five) minutes as needed for chest pain. 25 tablet 1    Results for orders placed or performed during the hospital encounter of 07/10/16 (from the past 48 hour(s))  Glucose, capillary     Status: Abnormal   Collection Time: 07/14/16  8:38 PM  Result Value Ref Range   Glucose-Capillary 156 (H) 65 - 99 mg/dL   Comment 1 Capillary Specimen   Glucose, capillary     Status: Abnormal   Collection Time: 07/15/16 12:08 AM  Result Value Ref Range   Glucose-Capillary 137 (H) 65 - 99 mg/dL   Comment 1 Capillary Specimen   CBC     Status: Abnormal   Collection Time: 07/15/16  2:20 AM  Result Value Ref Range   WBC 8.1 4.0 - 10.5 K/uL   RBC 3.57 (L) 3.87 - 5.11 MIL/uL   Hemoglobin 9.1 (L) 12.0 - 15.0 g/dL   HCT 31.2 (L) 36.0 - 46.0 %   MCV 87.4 78.0 - 100.0 fL   MCH 25.5 (L)  26.0 - 34.0 pg   MCHC 29.2 (L) 30.0 - 36.0 g/dL   RDW 15.6 (H) 11.5 - 15.5 %   Platelets 215  150 - 400 K/uL  Basic metabolic panel     Status: Abnormal   Collection Time: 07/15/16  2:20 AM  Result Value Ref Range   Sodium 141 135 - 145 mmol/L   Potassium 4.5 3.5 - 5.1 mmol/L   Chloride 103 101 - 111 mmol/L   CO2 32 22 - 32 mmol/L   Glucose, Bld 123 (H) 65 - 99 mg/dL   BUN 44 (H) 6 - 20 mg/dL   Creatinine, Ser 1.06 (H) 0.44 - 1.00 mg/dL   Calcium 10.0 8.9 - 10.3 mg/dL   GFR calc non Af Amer 51 (L) >60 mL/min   GFR calc Af Amer 59 (L) >60 mL/min    Comment: (NOTE) The eGFR has been calculated using the CKD EPI equation. This calculation has not been validated in all clinical situations. eGFR's persistently <60 mL/min signify possible Chronic Kidney Disease.    Anion gap 6 5 - 15  Magnesium     Status: None   Collection Time: 07/15/16  2:20 AM  Result Value Ref Range   Magnesium 2.1 1.7 - 2.4 mg/dL  Phosphorus     Status: None   Collection Time: 07/15/16  2:20 AM  Result Value Ref Range   Phosphorus 3.2 2.5 - 4.6 mg/dL  Glucose, capillary     Status: Abnormal   Collection Time: 07/15/16  4:25 AM  Result Value Ref Range   Glucose-Capillary 130 (H) 65 - 99 mg/dL   Comment 1 Capillary Specimen   Glucose, capillary     Status: Abnormal   Collection Time: 07/15/16  8:10 AM  Result Value Ref Range   Glucose-Capillary 183 (H) 65 - 99 mg/dL   Comment 1 Capillary Specimen   Glucose, capillary     Status: Abnormal   Collection Time: 07/15/16 11:29 AM  Result Value Ref Range   Glucose-Capillary 143 (H) 65 - 99 mg/dL   Comment 1 Capillary Specimen   Glucose, capillary     Status: Abnormal   Collection Time: 07/15/16  4:21 PM  Result Value Ref Range   Glucose-Capillary 223 (H) 65 - 99 mg/dL   Comment 1 Capillary Specimen   I-STAT 3, arterial blood gas (G3+)     Status: Abnormal   Collection Time: 07/15/16  4:25 PM  Result Value Ref Range   pH, Arterial 7.433 7.350 - 7.450    pCO2 arterial 52.0 (H) 35.0 - 45.0 mmHg   pO2, Arterial 138.0 (H) 80.0 - 100.0 mmHg   Bicarbonate 34.8 (H) 20.0 - 24.0 mEq/L   TCO2 36 0 - 100 mmol/L   O2 Saturation 99.0 %   Acid-Base Excess 9.0 (H) 0.0 - 2.0 mmol/L   Patient temperature 98.6 F    Collection site RADIAL, ALLEN'S TEST ACCEPTABLE    Drawn by Operator    Sample type ARTERIAL   Glucose, capillary     Status: Abnormal   Collection Time: 07/15/16  9:03 PM  Result Value Ref Range   Glucose-Capillary 247 (H) 65 - 99 mg/dL   Comment 1 Capillary Specimen   Glucose, capillary     Status: Abnormal   Collection Time: 07/15/16 11:26 PM  Result Value Ref Range   Glucose-Capillary 245 (H) 65 - 99 mg/dL  CBC     Status: Abnormal   Collection Time: 07/16/16  2:11 AM  Result Value Ref Range   WBC 8.0 4.0 - 10.5 K/uL   RBC 3.50 (L) 3.87 - 5.11 MIL/uL   Hemoglobin 9.0 (L) 12.0 - 15.0 g/dL  HCT 30.6 (L) 36.0 - 46.0 %   MCV 87.4 78.0 - 100.0 fL   MCH 25.7 (L) 26.0 - 34.0 pg   MCHC 29.4 (L) 30.0 - 36.0 g/dL   RDW 15.8 (H) 11.5 - 15.5 %   Platelets 196 150 - 400 K/uL  Basic metabolic panel     Status: Abnormal   Collection Time: 07/16/16  2:11 AM  Result Value Ref Range   Sodium 141 135 - 145 mmol/L   Potassium 4.5 3.5 - 5.1 mmol/L   Chloride 104 101 - 111 mmol/L   CO2 28 22 - 32 mmol/L   Glucose, Bld 223 (H) 65 - 99 mg/dL   BUN 41 (H) 6 - 20 mg/dL   Creatinine, Ser 1.12 (H) 0.44 - 1.00 mg/dL   Calcium 9.8 8.9 - 10.3 mg/dL   GFR calc non Af Amer 48 (L) >60 mL/min   GFR calc Af Amer 55 (L) >60 mL/min    Comment: (NOTE) The eGFR has been calculated using the CKD EPI equation. This calculation has not been validated in all clinical situations. eGFR's persistently <60 mL/min signify possible Chronic Kidney Disease.    Anion gap 9 5 - 15  Glucose, capillary     Status: Abnormal   Collection Time: 07/16/16  3:37 AM  Result Value Ref Range   Glucose-Capillary 231 (H) 65 - 99 mg/dL  Glucose, capillary     Status: Abnormal    Collection Time: 07/16/16  7:33 AM  Result Value Ref Range   Glucose-Capillary 249 (H) 65 - 99 mg/dL  Glucose, capillary     Status: Abnormal   Collection Time: 07/16/16 11:53 AM  Result Value Ref Range   Glucose-Capillary 218 (H) 65 - 99 mg/dL  Glucose, capillary     Status: Abnormal   Collection Time: 07/16/16  3:16 PM  Result Value Ref Range   Glucose-Capillary 194 (H) 65 - 99 mg/dL   Ct Head Wo Contrast  Result Date: 07/15/2016 CLINICAL DATA:  Follow-up evaluation. RIGHT arm weakness. History of chronic renal failure, hyperlipidemia, hypertension. EXAM: CT HEAD WITHOUT CONTRAST TECHNIQUE: Contiguous axial images were obtained from the base of the skull through the vertex without intravenous contrast. COMPARISON:  CT HEAD July 13, 2016 in CT HEAD October 05, 2011. FINDINGS: INTRACRANIAL CONTENTS: The ventricles and sulci are normal for age. No intraparenchymal hemorrhage, mass effect nor midline shift. Patchy supratentorial white matter hypodensities are less than expected for patient's age and though non-specific likely represent chronic small vessel ischemic disease. No acute large vascular territory infarcts. No abnormal extra-axial fluid collections. Basal cisterns are patent. Mild calcific atherosclerosis of the carotid siphons. ORBITS: The included ocular globes and orbital contents are non-suspicious. SINUSES: Lobulated maxillary sinusitis without antral expansion. Minimal LEFT mastoid effusion. SKULL/SOFT TISSUES: No skull fracture. No significant soft tissue swelling. Life-support lines in place. Stable 7 mm sclerotic focus LEFT clivus. IMPRESSION: Negative CT HEAD for age. Electronically Signed   By: Elon Alas M.D.   On: 07/15/2016 02:06   Dg Chest Port 1 View  Result Date: 07/16/2016 CLINICAL DATA:  Acute respiratory failure, history of asthma, CHF. EXAM: PORTABLE CHEST 1 VIEW COMPARISON:  Portable chest x-ray of July 15, 2016 FINDINGS: The lungs are well-expanded. The  interstitial markings are mildly prominent bilaterally. The cardiac silhouette is enlarged. The central pulmonary vascularity is engorged. Minimal pleural fluid at the left lung base is suspected. There is calcification in the wall of the aortic arch. The endotracheal tube tip lies  4.9 cm above the carina. The esophagogastric tube tip projects below the inferior margin of the image. The permanent pacemaker electrodes appear to be in stable position where visualized. IMPRESSION: Slight overall increase in conspicuity of the pulmonary interstitium suggests low-grade interstitial edema stable cardiomegaly. Minimal left basilar atelectasis and/or trace of pleural fluid. The support tubes are in reasonable position. Aortic atherosclerosis. Electronically Signed   By: David  [name] M.D.   On: 07/16/2016 07:20   Dg Chest Port 1 View  Result Date: 07/15/2016 CLINICAL DATA:  Evaluate placement of endotracheal tube. EXAM: PORTABLE CHEST 1 VIEW COMPARISON:  07/15/2016 at 5:08 a.m. FINDINGS: Endotracheal tube tip projects 2.6 cm above the carina. Oral/nasogastric tube passes below the diaphragm well into the stomach. Cardiac silhouette is enlarged. There is lung base opacity, left greater than right, most likely atelectasis. No convincing pulmonary edema. No pneumothorax. IMPRESSION: 1. Endotracheal tube tip now projects 2.6 cm above the carina. 2. Mild left greater than right basilar atelectasis is stable the prior exam. No new lung opacities. Electronically Signed   By: Lajean Manes M.D.   On: 07/15/2016 16:53   Dg Chest Port 1 View  Result Date: 07/15/2016 CLINICAL DATA:  Acute respiratory failure. EXAM: PORTABLE CHEST 1 VIEW COMPARISON:  07/14/2016 FINDINGS: Endotracheal tube terminates at the clavicular heads, unchanged. Enteric tube courses into the left upper abdomen with tip not imaged. Cardiac silhouette remains enlarged. Lung volumes are mildly diminished with new mild left basilar opacity which likely reflects  atelectasis. There is minimal right basilar atelectasis which is slightly improved. No sizable pleural effusion or pneumothorax is identified. IMPRESSION: Bibasilar atelectasis. Electronically Signed   By: Logan Bores M.D.   On: 07/15/2016 07:32     Blood pressure (!) 153/77, pulse 75, temperature 99.5 F (37.5 C), temperature source Oral, resp. rate (!) 0, height _0  (1.651 m), weight 80.2 kg (176 lb 12.9 oz), SpO2 100 %.  PHYSICAL EXAM: Overall appearance:  Obese. Intubated. Semi-purposeful movements with manipulation.   Head: NCAT Ears: clear Nose: clear Oral Cavity: OT tube, OG tube, OT suction tube. Bulky tongue. Oral Pharynx/Hypopharynx/Larynx:  See below Neuro: did not evaluate  Neck:  Muscular.    Using the flexible laryngoscope with 5 ml of 2% viscous xylocaine for topical anesthesia, flexible laryngoscope was introduced per RIGHT nose.  Pooled secretions in pharynx.  Could not see any portion of the larynx adequately. Pt tol. Procedure well.    Assessment/Plan Traumatic laryngeal edema, now 7 days out.  Pepcid for reflux is likely inadequate.   Plan:  D/C Pepcid.  Add OG protonix bid ac.  Continue possible weaning trials.  If she does not have excellent response, I would do Direct Laryngoscopy under anesthesia, possible tracheostomy if still significant edema.  It is surprising that blunt trauma has been so slow to resolve.    [name] 08/15/9165, 6:53 PM

## 2016-07-16 NOTE — Progress Notes (Signed)
Inpatient Diabetes Program Recommendations  AACE/[name]: New Consensus Statement on Inpatient Glycemic Control (2015)  Target Ranges:  Prepandial:   less than 140 mg/dL      Peak postprandial:   less than 180 mg/dL (1-2 hours)      Critically ill patients:  140 - 180 mg/dL   Results for [name], [name] (MRN [id number]) as of 07/16/2016 10:55  Ref. Range 07/15/2016 00:08 07/15/2016 04:25 07/15/2016 08:10 07/15/2016 11:29 07/15/2016 16:21 07/15/2016 21:03  Glucose-Capillary Latest Ref Range: 65 - 99 mg/dL 137 (H) 130 (H) 183 (H) 143 (H) 223 (H) 247 (H)   Results for [name], [name] (MRN [id number]) as of 07/16/2016 10:55  Ref. Range 07/15/2016 23:26 07/16/2016 03:37  Glucose-Capillary Latest Ref Range: 65 - 99 mg/dL 245 (H) 231 (H)    Admit with: Respiratory Failure after TEE  History: Complete Heart Block, CKD  Current Insulin Orders: Novolog Moderate Correction Scale/ SSI (0-15 units) Q4 hours      -Patient currently receiving IV Solumedrol 40 mg Q6 hours.  -Glucose levels >200 mg/dl.  -Intubated and also receiving Vital HP tube feeds at 55 cc/hour.     MD- Please consider the following in-hospital insulin adjustments while patient receiving IV steroids and tube feedings:  Start ICU Glycemic Control Protocol (Phase 2 IV Insulin)  OR  Start Levemir 8 units daily (0.1 units/kg) and   Novolog tube feed coverage: Novolog 3 units Q4 hours (hold if tube feeds held for any reason)      --Will follow patient during hospitalization--  Wyn Quaker RN, MSN, CDE Diabetes Coordinator Inpatient Glycemic Control Team Team Pager: [phone] (8a-5p)

## 2016-07-16 NOTE — Progress Notes (Addendum)
PULMONARY / CRITICAL CARE MEDICINE   Name: [name] MRN: [id number] DOB: [birth year]    ADMISSION DATE:  07/10/2016 CONSULTATION DATE:  7/27  REFERRING MD:  Allred   CHIEF COMPLAINT:  Respiratory failure   HISTORY OF PRESENT ILLNESS:   70yo female former smoker, polymyalgia rheumatica, HTN, hypertrophic cardiomyopathy, CKD, AFib not candidate for anticoagulation r/t prior GI bleeding who was admitted 7/27 for elective TEE to evaluate L atrial appendage and place watchman device (left atrial appendage closure system for long term stroke prevention).  Pt was anemic on admit and decision was made to not proceed with watchman device (as it would require asa, coumadin for short time afterwards) but TEE was done to eval function and anatomy.  MD had significant difficulty passing the probe, which was eventually passed by anesthesia.  TEE showed ?stunned LV with severe hypokinesis and EF 20-25% and non obstructive HOCM.  However she had significant hypotension and increasing respiratory distress so she was intubated, started on pressors and tx to ICU post procedure.    SUBJECTIVE: Re intubated after she developed stridor.  VITAL SIGNS: BP (!) 170/80   Pulse 75   Temp 98.8 F (37.1 C) (Oral)   Resp 14   Ht 5\' 5"  (1.651 m)   Wt 176 lb 12.9 oz (80.2 kg)   SpO2 100%   BMI 29.42 kg/m   HEMODYNAMICS:   VENTILATOR SETTINGS: Vent Mode: CPAP;PSV FiO2 (%):  [40 %-60 %] 40 % Set Rate:  [14 bmp-16 bmp] 16 bmp Vt Set:  [450 mL] 450 mL PEEP:  [5 cmH20] 5 cmH20 Pressure Support:  [10 cmH20] 10 cmH20 Plateau Pressure:  [18 cmH20-27 cmH20] 18 cmH20  INTAKE / OUTPUT: I/O last 3 completed shifts: In: 2315.8 [I.V.:760.8; NG/GT:1405; IV Piggyback:150] Out: 2245 [Urine:2245]  PHYSICAL EXAMINATION:  General:  No distress on vent Neuro: opens eyes to name, Follows commands HEENT:  ETT in place, moist mucus membranes Cardiovascular:  S1, S2, RRR, no MRG Lungs:  Clear, no wheeze or  crackles  Abdomen:  Soft, + BS  Musculoskeletal:  Warm and dry, +1 edema   LABS:  BMET  Recent Labs Lab 07/14/16 0218 07/15/16 0220 07/16/16 0211  NA 141 141 141  K 4.6 4.5 4.5  CL 105 103 104  CO2 31 32 28  BUN 53* 44* 41*  CREATININE 1.24* 1.06* 1.12*  GLUCOSE 159* 123* 223*    Electrolytes  Recent Labs Lab 07/12/16 0533  07/12/16 1830  07/14/16 0218 07/15/16 0220 07/16/16 0211  CALCIUM  --   < >  --   < > 9.8 10.0 9.8  MG 2.4  --  2.6*  --   --  2.1  --   PHOS 4.1  --  3.2  --   --  3.2  --   < > = values in this interval not displayed.  CBC  Recent Labs Lab 07/14/16 0218 07/15/16 0220 07/16/16 0211  WBC 7.4 8.1 8.0  HGB 8.7* 9.1* 9.0*  HCT 30.4* 31.2* 30.6*  PLT 193 215 196    Coag's No results for input(s): APTT, INR in the last 168 hours.  Sepsis Markers  Recent Labs Lab 07/10/16 1703  LATICACIDVEN 1.1    ABG  Recent Labs Lab 07/10/16 1702 07/11/16 0350 07/15/16 1625  PHART 7.318* 7.302* 7.433  PCO2ART 56.2* 51.3* 52.0*  PO2ART 496.0* 156* 138.0*    Liver Enzymes No results for input(s): AST, ALT, ALKPHOS, BILITOT, ALBUMIN in the last 168 hours.  Cardiac  Enzymes No results for input(s): TROPONINI, PROBNP in the last 168 hours.  Glucose  Recent Labs Lab 07/15/16 0810 07/15/16 1129 07/15/16 1621 07/15/16 2103 07/15/16 2326 07/16/16 0337  GLUCAP 183* 143* 223* 247* 245* 231*    Imaging Dg Chest Port 1 View  Result Date: 07/16/2016 CLINICAL DATA:  Acute respiratory failure, history of asthma, CHF. EXAM: PORTABLE CHEST 1 VIEW COMPARISON:  Portable chest x-ray of July 15, 2016 FINDINGS: The lungs are well-expanded. The interstitial markings are mildly prominent bilaterally. The cardiac silhouette is enlarged. The central pulmonary vascularity is engorged. Minimal pleural fluid at the left lung base is suspected. There is calcification in the wall of the aortic arch. The endotracheal tube tip lies 4.9 cm above the carina.  The esophagogastric tube tip projects below the inferior margin of the image. The permanent pacemaker electrodes appear to be in stable position where visualized. IMPRESSION: Slight overall increase in conspicuity of the pulmonary interstitium suggests low-grade interstitial edema stable cardiomegaly. Minimal left basilar atelectasis and/or trace of pleural fluid. The support tubes are in reasonable position. Aortic atherosclerosis. Electronically Signed   By: David  [name] M.D.   On: 07/16/2016 07:20   Dg Chest Port 1 View  Result Date: 07/15/2016 CLINICAL DATA:  Evaluate placement of endotracheal tube. EXAM: PORTABLE CHEST 1 VIEW COMPARISON:  07/15/2016 at 5:08 a.m. FINDINGS: Endotracheal tube tip projects 2.6 cm above the carina. Oral/nasogastric tube passes below the diaphragm well into the stomach. Cardiac silhouette is enlarged. There is lung base opacity, left greater than right, most likely atelectasis. No convincing pulmonary edema. No pneumothorax. IMPRESSION: 1. Endotracheal tube tip now projects 2.6 cm above the carina. 2. Mild left greater than right basilar atelectasis is stable the prior exam. No new lung opacities. Electronically Signed   By: Lajean Manes M.D.   On: 07/15/2016 16:53   STUDIES:  TEE 7/27 >>  ?stunned LV with severe hypokinesis and EF 20-25% and non obstructive HOCM 2D echo 7/28 >> LVEF 55-60%, moderate asymmetric septal hypertrophy, intermittent diastolic dysfunction (AF), no RWMA, PA peak 62  CT soft tissue neck 7/27 >> pharynx is collapsed around ETT, ? Pharyngeal edema, no hematoma, no gas in soft tissue, extensive mucosal edema in paranasal sinuses, RUL atelectasis CT Head 7/27 >> no acute abnormality  CULTURES: MRSA 7/27 (-) Trach asp 7/27 >>  Blood 7/27 >>  ANTIBIOTICS: None  SIGNIFICANT EVENTS: 7/27 TEE. Went into resp distress and hypotension during TEE. Admit 2H  LINES/TUBES: ETT 7/27 >> R rad aline 7/27 >> 7/29  DISCUSSION: 70yo female with  persistent AFib unable to be anticoagulated r/t previous GI bleeding and known hypertrophic cardiomyopathy admitted after hypotension and resp fx during TEE.  She had a very difficult reintubation yesterday after she developed stridor post extubation. She has edematous vocal cords that were almost closed shut.   ASSESSMENT / PLAN:  PULMONARY Acute hypoxemic hypercapneic respiratory failure secondary to inability to protect airway. Swollen airways after TEE.  Edematous vocal cords with very small entry. Pt with very difficult airway and has a size 6.5 ETT.  COPD - by history but she is not on meds and spirometry did not reveal COPD. Doubt she has an exacerbation now Likely untreated OSA / OHS Elevated PA Pressures on ECHO.  P:   Re intubated after stridor yesterday. Agree with ENT assessment.  Repeat CT of the head and neck Check daily cuff leaks She will likely need a trach. Will discuss with family Continue steroids  CARDIOVASCULAR Stunned  LV with significant hypokinesis EF 20-25%. S/P TEE 7/27 Chronic AFib - paced, not candidate for anticoagulation r/t previous GI bleeding  Complete heart block  P:  Cardiology following No anticoagulation due to GI bleed  Tele monitoring  RENAL CKD IIIB P:   Trend BMP / UOP  KVO IVF  GASTROINTESTINAL Hx GI bleed - no current s/s bleeding  P:   PPI  Monitor Hgb TF per nutrition  HEMATOLOGIC Anemia  P:  Trend CBC SCD's for DVT prophylaxis  Add heparin S.Q  INFECTIOUS No active issue  P:   Monitor fever, WBC curve off abx   ENDOCRINE Chronic steroids (on 5 mg of pred daily) Hx polymyalgia rheumatica  P:   S/p Decadron X 3 days for airway edema Restarted on solumedrol yesterday SSI while on steroids. Change to resistant scale  NEUROLOGIC Concern for seizures but EEG was negative Unable to get MRI brain because of pacemaker P:   RASS goal: 0 Off AEDs as there is not more evidence of seizure activity Sedation while  on vent.  FAMILY  - Updates:  Family updated 8/2. - Inter-disciplinary family meet or Palliative Care meeting due by:  8/3.  Critical care time- 35 mins.  Marshell Garfinkel MD [facility] Pager [phone] If no answer or after 3pm call: [phone] 07/16/2016, 8:45 AM

## 2016-07-16 NOTE — Progress Notes (Signed)
    SUBJECTIVE: The patient is intubated and sedated.  Did not tolerate extubation due to stridor.  CURRENT MEDICATIONS: . amLODipine  5 mg Per Tube Daily  . antiseptic oral rinse  7 mL Mouth Rinse 10 times per day  . chlorhexidine gluconate (SAGE KIT)  15 mL Mouth Rinse BID  . famotidine (PEPCID) IV  20 mg Intravenous Q12H  . insulin aspart  0-15 Units Subcutaneous Q4H  . ipratropium-albuterol  3 mL Nebulization Q6H  . methylPREDNISolone (SOLU-MEDROL) injection  40 mg Intravenous Q6H   . sodium chloride 10 mL/hr at 07/15/16 2200  . feeding supplement (VITAL HIGH PROTEIN) 1,000 mL (07/15/16 2200)  . fentaNYL infusion INTRAVENOUS 200 mcg/hr (07/16/16 0330)  . midazolam (VERSED) infusion Stopped (07/14/16 0844)    OBJECTIVE: Physical Exam: Vitals:   07/16/16 0600 07/16/16 0603 07/16/16 0630 07/16/16 0700  BP: (!) 171/89 (!) 173/90 (!) 142/69 137/70  Pulse: 75 75 75 75  Resp: 16 17 16 17  Temp:      TempSrc:      SpO2: 100% 100% 100% 100%  Weight:      Height:        Intake/Output Summary (Last 24 hours) at 07/16/16 0723 Last data filed at 07/16/16 0700  Gross per 24 hour  Intake          1478.33 ml  Output             1375 ml  Net           103.33 ml    Telemetry reveals atrial fibrillation with V pacing, occasional PVC's   GEN- The patient is intubated, will open eyes to voice and follow commands Head- normocephalic, atraumatic Eyes-  Sclera clear, conjunctiva pink Ears- hearing intact Oropharynx- +ETT Neck- supple  Lungs- Clear to ausculation bilaterally, normal work of breathing Heart- Regular rate and rhythm (paced) GI- soft, NT, ND, + BS Extremities- no clubbing, cyanosis, or edema Skin- no rash or lesion    LABS: Basic Metabolic Panel:  Recent Labs  07/15/16 0220 07/16/16 0211  NA 141 141  K 4.5 4.5  CL 103 104  CO2 32 28  GLUCOSE 123* 223*  BUN 44* 41*  CREATININE 1.06* 1.12*  CALCIUM 10.0 9.8  MG 2.1  --   PHOS 3.2  --    CBC:  Recent  Labs  07/15/16 0220 07/16/16 0211  WBC 8.1 8.0  HGB 9.1* 9.0*  HCT 31.2* 30.6*  MCV 87.4 87.4  PLT 215 196    ASSESSMENT AND PLAN:  1.  Acute respiratory failure/hypotension/ stridor with extubation Vent management per CCM  May benefit from ENT assessment of airway.  I will discuss with Dr Wolicki  2.  Permanent atrial fibrillation No OAC 2/2 anemia With difficulty with sedation and intubation during TEE, she is at increased risk for procedures going forward, would be hesitant to pursue watchman in the future  3.  Complete heart block Normal PPM function this admission  4.  Chronic renal failure Stable  5.  Anemia Stable today   6.  Concern for AMS Improved Appreciate neurology evaluation  proph- would consider heparin DVT prophylaxis.  Will defer to PCCM.      MD, FACC 07/16/2016 7:23 AM   

## 2016-07-17 ENCOUNTER — Inpatient Hospital Stay (HOSPITAL_COMMUNITY): Payer: Medicare Other

## 2016-07-17 ENCOUNTER — Encounter (HOSPITAL_COMMUNITY): Payer: Self-pay

## 2016-07-17 LAB — BASIC METABOLIC PANEL
Anion gap: 9 (ref 5–15)
BUN: 51 mg/dL — AB (ref 6–20)
CALCIUM: 10.3 mg/dL (ref 8.9–10.3)
CO2: 29 mmol/L (ref 22–32)
CREATININE: 1.04 mg/dL — AB (ref 0.44–1.00)
Chloride: 103 mmol/L (ref 101–111)
GFR calc non Af Amer: 52 mL/min — ABNORMAL LOW (ref >60)
Glucose, Bld: 219 mg/dL — ABNORMAL HIGH (ref 65–99)
Potassium: 4.8 mmol/L (ref 3.5–5.1)
SODIUM: 141 mmol/L (ref 135–145)

## 2016-07-17 LAB — GLUCOSE, CAPILLARY
GLUCOSE-CAPILLARY: 202 mg/dL — AB (ref 65–99)
GLUCOSE-CAPILLARY: 243 mg/dL — AB (ref 65–99)
GLUCOSE-CAPILLARY: 243 mg/dL — AB (ref 65–99)
Glucose-Capillary: 230 mg/dL — ABNORMAL HIGH (ref 65–99)
Glucose-Capillary: 232 mg/dL — ABNORMAL HIGH (ref 65–99)
Glucose-Capillary: 241 mg/dL — ABNORMAL HIGH (ref 65–99)

## 2016-07-17 LAB — CBC
HCT: 32.3 % — ABNORMAL LOW (ref 36.0–46.0)
Hemoglobin: 9.5 g/dL — ABNORMAL LOW (ref 12.0–15.0)
MCH: 25.7 pg — AB (ref 26.0–34.0)
MCHC: 29.4 g/dL — AB (ref 30.0–36.0)
MCV: 87.3 fL (ref 78.0–100.0)
PLATELETS: 250 10*3/uL (ref 150–400)
RBC: 3.7 MIL/uL — ABNORMAL LOW (ref 3.87–5.11)
RDW: 16 % — AB (ref 11.5–15.5)
WBC: 11.6 10*3/uL — ABNORMAL HIGH (ref 4.0–10.5)

## 2016-07-17 LAB — MAGNESIUM: MAGNESIUM: 2.3 mg/dL (ref 1.7–2.4)

## 2016-07-17 LAB — PHOSPHORUS: PHOSPHORUS: 3.2 mg/dL (ref 2.5–4.6)

## 2016-07-17 MED ORDER — IOPAMIDOL (ISOVUE-300) INJECTION 61%
INTRAVENOUS | Status: AC
  Administered 2016-07-17: 75 mL
  Filled 2016-07-17: qty 75

## 2016-07-17 NOTE — Progress Notes (Signed)
   07/17/16 1100  Clinical Encounter Type  Visited With Patient and family together  Visit Type Spiritual support  Referral From Nurse  Consult/Referral To Chaplain  Spiritual Encounters  Spiritual Needs Prayer  Stress Factors  Patient Stress Factors Loss of control  Family Stress Factors Loss of control  CHP provided emotional support and prayed with patient and family.  CHP available to follow up as needed. Roe Coombs 07/17/16

## 2016-07-17 NOTE — Care Management Important Message (Signed)
Important Message  Patient Details  Name: [name] MRN: [id number] Date of Birth: [birth year]   Medicare Important Message Given:  Yes    Loann Quill 07/17/2016, 10:45 AM

## 2016-07-17 NOTE — Progress Notes (Addendum)
SUBJECTIVE: The patient is intubated and sedated.  ENT has evaluated.  CURRENT MEDICATIONS: . amLODipine  5 mg Per Tube Daily  . antiseptic oral rinse  7 mL Mouth Rinse 10 times per day  . chlorhexidine gluconate (SAGE KIT)  15 mL Mouth Rinse BID  . docusate  100 mg Oral Daily  . enoxaparin (LOVENOX) injection  40 mg Subcutaneous Q24H  . insulin aspart  0-15 Units Subcutaneous Q4H  . iopamidol      . ipratropium-albuterol  3 mL Nebulization Q6H  . lidocaine  15 mL Mouth/Throat Once  . methylPREDNISolone (SOLU-MEDROL) injection  40 mg Intravenous Q6H  . pantoprazole sodium  40 mg Per Tube BID AC   . sodium chloride Stopped (07/17/16 0300)  . feeding supplement (VITAL HIGH PROTEIN) 1,000 mL (07/17/16 0700)  . fentaNYL infusion INTRAVENOUS 200 mcg/hr (07/17/16 0700)  . midazolam (VERSED) infusion Stopped (07/14/16 0844)    OBJECTIVE: Physical Exam: Vitals:   07/17/16 0349 07/17/16 0357 07/17/16 0714 07/17/16 0755  BP: (!) 156/72  (!) 170/83   Pulse: 75  75   Resp: 19  17   Temp: 98.8 F (37.1 C)   99.5 F (37.5 C)  TempSrc: Oral   Oral  SpO2: 100%  100%   Weight:  177 lb 11.1 oz (80.6 kg)    Height:        Intake/Output Summary (Last 24 hours) at 07/17/16 6734 Last data filed at 07/17/16 0700  Gross per 24 hour  Intake             1905 ml  Output             1750 ml  Net              155 ml    Telemetry reveals atrial fibrillation with V pacing, occasional PVC's   GEN- The patient is intubated, will open eyes to voice and follow commands Head- normocephalic, atraumatic Eyes-  Sclera clear, conjunctiva pink Ears- hearing intact Oropharynx- +ETT Neck- supple  Lungs- Clear to ausculation bilaterally, normal work of breathing Heart- Regular rate and rhythm (paced) GI- soft, NT, ND, + BS Extremities- no clubbing, cyanosis, or edema Skin- no rash or lesion    LABS: Basic Metabolic Panel:  Recent Labs  07/15/16 0220 07/16/16 0211 07/17/16 0234  NA 141  141 141  K 4.5 4.5 4.8  CL 103 104 103  CO2 32 28 29  GLUCOSE 123* 223* 219*  BUN 44* 41* 51*  CREATININE 1.06* 1.12* 1.04*  CALCIUM 10.0 9.8 10.3  MG 2.1  --  2.3  PHOS 3.2  --  3.2   CBC:  Recent Labs  07/16/16 0211 07/17/16 0234  WBC 8.0 11.6*  HGB 9.0* 9.5*  HCT 30.6* 32.3*  MCV 87.4 87.3  PLT 196 250    ASSESSMENT AND PLAN:  1.  Acute respiratory failure/hypotension/ stridor with extubation Vent management per CCM  Appreciate ENT   2.  Permanent atrial fibrillation No OAC 2/2 anemia With difficulty with sedation and intubation during TEE, she is at increased risk for procedures going forward, would be hesitant to pursue watchman in the future  3.  Complete heart block Normal PPM function this admission  4.  Chronic renal failure Stable  5.  Anemia Stable today   6.  Concern for AMS Appears resolved  Appreciate PCCM assistance and taking patient on their service No new EP recs  Thompson Grayer MD, [facility] 07/17/2016 8:03 AM

## 2016-07-17 NOTE — Progress Notes (Addendum)
PULMONARY / CRITICAL CARE MEDICINE   Name: [name] MRN: [id number] DOB: 21-Jan-1946    ADMISSION DATE:  07/10/2016 CONSULTATION DATE:  7/27  REFERRING MD:  Allred   CHIEF COMPLAINT:  Respiratory failure   HISTORY OF PRESENT ILLNESS:   70yo female former smoker, polymyalgia rheumatica, HTN, hypertrophic cardiomyopathy, CKD, AFib not candidate for anticoagulation r/t prior GI bleeding who was admitted 7/27 for elective TEE to evaluate L atrial appendage and place watchman device (left atrial appendage closure system for long term stroke prevention).  Pt was anemic on admit and decision was made to not proceed with watchman device (as it would require asa, coumadin for short time afterwards) but TEE was done to eval function and anatomy.  MD had significant difficulty passing the probe, which was eventually passed by anesthesia.  TEE showed ?stunned LV with severe hypokinesis and EF 20-25% and non obstructive HOCM.  However she had significant hypotension and increasing respiratory distress so she was intubated, started on pressors and tx to ICU post procedure.    SUBJECTIVE: No events overnight.  VITAL SIGNS: BP (!) 170/83   Pulse 75   Temp 99.5 F (37.5 C) (Oral)   Resp 17   Ht 5\' 5"  (1.651 m)   Wt 177 lb 11.1 oz (80.6 kg)   SpO2 100%   BMI 29.57 kg/m   HEMODYNAMICS:   VENTILATOR SETTINGS: Vent Mode: CPAP;PSV FiO2 (%):  [40 %] 40 % Set Rate:  [16 bmp] 16 bmp Vt Set:  [450 mL] 450 mL PEEP:  [5 cmH20] 5 cmH20 Pressure Support:  [15 cmH20] 15 cmH20 Plateau Pressure:  [14 cmH20-19 cmH20] 18 cmH20  INTAKE / OUTPUT: I/O last 3 completed shifts: In: 3278.3 [I.V.:1097.1; NG/GT:2081.3; IV Piggyback:100] Out: 2700 [Urine:2700]  PHYSICAL EXAMINATION:  General:  No distress on vent Neuro: opens eyes to name, Follows commands HEENT:  ETT in place, moist mucus membranes Cardiovascular:  S1, S2, RRR, no MRG Lungs:  Clear, no wheeze or crackles  Abdomen:  Soft, + BS   Musculoskeletal:  Warm and dry, +1 edema   LABS:  BMET  Recent Labs Lab 07/15/16 0220 07/16/16 0211 07/17/16 0234  NA 141 141 141  K 4.5 4.5 4.8  CL 103 104 103  CO2 32 28 29  BUN 44* 41* 51*  CREATININE 1.06* 1.12* 1.04*  GLUCOSE 123* 223* 219*    Electrolytes  Recent Labs Lab 07/12/16 1830  07/15/16 0220 07/16/16 0211 07/17/16 0234  CALCIUM  --   < > 10.0 9.8 10.3  MG 2.6*  --  2.1  --  2.3  PHOS 3.2  --  3.2  --  3.2  < > = values in this interval not displayed.  CBC  Recent Labs Lab 07/15/16 0220 07/16/16 0211 07/17/16 0234  WBC 8.1 8.0 11.6*  HGB 9.1* 9.0* 9.5*  HCT 31.2* 30.6* 32.3*  PLT 215 196 250    Coag's No results for input(s): APTT, INR in the last 168 hours.  Sepsis Markers  Recent Labs Lab 07/10/16 1703  LATICACIDVEN 1.1    ABG  Recent Labs Lab 07/10/16 1702 07/11/16 0350 07/15/16 1625  PHART 7.318* 7.302* 7.433  PCO2ART 56.2* 51.3* 52.0*  PO2ART 496.0* 156* 138.0*    Liver Enzymes No results for input(s): AST, ALT, ALKPHOS, BILITOT, ALBUMIN in the last 168 hours.  Cardiac Enzymes No results for input(s): TROPONINI, PROBNP in the last 168 hours.  Glucose  Recent Labs Lab 07/16/16 0733 07/16/16 1153 07/16/16 1516 07/16/16 1949  07/16/16 2347 07/17/16 0353  GLUCAP 249* 218* 194* 204* 232* 202*    Imaging Ct Soft Tissue Neck W Contrast  Result Date: 07/17/2016 CLINICAL DATA:  Stridor. Evaluate pharyngeal edema. History of renal failure, heart failure EXAM: CT NECK WITH CONTRAST TECHNIQUE: Multidetector CT imaging of the neck was performed using the standard protocol following the bolus administration of intravenous contrast. CONTRAST:  59mL ISOVUE-300 IOPAMIDOL (ISOVUE-300) INJECTION 61% COMPARISON:  CT neck July 10, 2016 FINDINGS: Pharynx and larynx: Similarly effaced at the level of the oral pharynx, limited assessment due tube placement of life-support lines. Decreased pharyngeal edema. Preservation of  parapharyngeal fat tissue planes. Salivary glands: Normal. Thyroid: Normal. Lymph nodes: No lymphadenopathy by CT size criteria. Vascular: Normal though not tailored for evaluation. Trace calcific atherosclerosis RIGHT carotid bulb. Limited intracranial: Normal. Visualized orbits: Normal. Mastoids and visualized paranasal sinuses: Pan paranasal sinusitis, near complete of fat pace min of maxillary sinuses. Small LEFT mastoid effusion. Skeleton: No destructive bony lesions. Moderate to severe C5-6 disc height loss, uncovertebral hypertrophy compatible with degenerative disc. RIGHT greater than LEFT mandible molar periapical abscess. Upper chest: Lung apices are clear. No superior mediastinal lymphadenopathy. Endotracheal tube tip above the carina. Nasogastric tube in place, distal tip not imaged. IMPRESSION: Life-support lines remain in place. Airway effacement, decreased pharyngeal edema. Electronically Signed   By: Elon Alas M.D.   On: 07/17/2016 03:49   Dg Chest Port 1 View  Result Date: 07/17/2016 CLINICAL DATA:  Acute respiratory failure, chronic atrial fibrillation chronic renal insufficiency, COPD, former smoker. EXAM: PORTABLE CHEST 1 VIEW COMPARISON:  Portable chest x-ray of July 16, 2016 FINDINGS: The patient is rotated on this study. The lungs are reasonably well inflated. There is persistent increased density in the left infrahilar region and left lower lobe. The cardiac silhouette remains enlarged. The pulmonary vascularity is prominent centrally. There is no significant pleural effusion and no pneumothorax. The permanent pacemaker is in stable position. The endotracheal tube tip lies 4.8 cm above the carina. The esophagogastric tube tip projects below the inferior margin of the image. There are degenerative changes of the left shoulder. IMPRESSION: Persistent left lower lobe atelectasis or pneumonia. Stable cardiomegaly without significant pulmonary vascular congestion. The support tubes are  in reasonable position Electronically Signed   By: David  [name] M.D.   On: 07/17/2016 07:52   STUDIES:  TEE 7/27 >>  ?stunned LV with severe hypokinesis and EF 20-25% and non obstructive HOCM 2D echo 7/28 >> LVEF 55-60%, moderate asymmetric septal hypertrophy, intermittent diastolic dysfunction (AF), no RWMA, PA peak 62  CT soft tissue neck 7/27 >> pharynx is collapsed around ETT, ? Pharyngeal edema, no hematoma, no gas in soft tissue, extensive mucosal edema in paranasal sinuses, RUL atelectasis CT Head 7/27 >> no acute abnormality  CULTURES: MRSA 7/27 (-) Trach asp 7/27 >>  Blood 7/27 >>  ANTIBIOTICS: None  SIGNIFICANT EVENTS: 7/27 TEE. Went into resp distress and hypotension during TEE. Admit 2H  LINES/TUBES: ETT 7/27 >> R rad aline 7/27 >> 7/29  DISCUSSION: 70yo female with persistent AFib unable to be anticoagulated r/t previous GI bleeding and known hypertrophic cardiomyopathy admitted after hypotension and resp fx during TEE.  She had a very difficult reintubation after she developed stridor post extubation. She has edematous vocal cords that were almost closed shut.   ASSESSMENT / PLAN:  PULMONARY Acute hypoxemic hypercapneic respiratory failure secondary to inability to protect airway. Swollen airways after TEE.  Edematous vocal cords with very small entry. Pt with  very difficult airway and has a size 6.5 ETT.  COPD - by history but she is not on meds and spirometry did not reveal COPD. Doubt she has an exacerbation now Likely untreated OSA / OHS Elevated PA Pressures on ECHO.  P:   Re intubated after stridor on 8/1 Appreciate ENT reccs. Plan for direct laryngoscopy under GA. Check daily cuff leaks Continue steroids  CARDIOVASCULAR Stunned LV with significant hypokinesis EF 20-25%. S/P TEE 7/27 Chronic AFib - paced, not candidate for anticoagulation r/t previous GI bleeding  Complete heart block  P:  Cardiology following No anticoagulation due to GI bleed   Tele monitoring  RENAL CKD IIIB P:   Trend BMP / UOP  KVO IVF  GASTROINTESTINAL Hx GI bleed - no current s/s bleeding  P:   PPI  Monitor Hgb TF per nutrition  HEMATOLOGIC Anemia  P:  Trend CBC SCD's for DVT prophylaxis  Add heparin S.Q  INFECTIOUS No active issue  P:   Monitor fever, WBC curve off abx   ENDOCRINE Chronic steroids (on 5 mg of pred daily) Hx polymyalgia rheumatica  P:   S/p Decadron X 3 days for airway edema Restarted on solumedrol. SSI while on steroids. Change to resistant scale  NEUROLOGIC Concern for seizures but EEG was negative Unable to get MRI brain because of pacemaker P:   RASS goal: 0 Off AEDs as there is not more evidence of seizure activity Sedation while on vent.  FAMILY  - Updates:  Family updated 8/2. - Inter-disciplinary family meet or Palliative Care meeting due by:  8/3.  Critical care time- 35 mins.  Marshell Garfinkel MD [place] Pager [phone] If no answer or after 3pm call: [phone] 07/17/2016, 7:59 AM

## 2016-07-17 NOTE — Progress Notes (Signed)
Pt transported to CT and back with no complications.

## 2016-07-17 NOTE — Progress Notes (Signed)
Checked pt for cuff leak.  Not present at this time.

## 2016-07-18 ENCOUNTER — Inpatient Hospital Stay (HOSPITAL_COMMUNITY): Payer: Medicare Other

## 2016-07-18 LAB — CBC
HCT: 34.5 % — ABNORMAL LOW (ref 36.0–46.0)
HEMOGLOBIN: 10.1 g/dL — AB (ref 12.0–15.0)
MCH: 25.6 pg — AB (ref 26.0–34.0)
MCHC: 29.3 g/dL — AB (ref 30.0–36.0)
MCV: 87.3 fL (ref 78.0–100.0)
PLATELETS: 259 10*3/uL (ref 150–400)
RBC: 3.95 MIL/uL (ref 3.87–5.11)
RDW: 16.1 % — ABNORMAL HIGH (ref 11.5–15.5)
WBC: 10.3 10*3/uL (ref 4.0–10.5)

## 2016-07-18 LAB — BASIC METABOLIC PANEL
Anion gap: 9 (ref 5–15)
BUN: 57 mg/dL — AB (ref 6–20)
CHLORIDE: 104 mmol/L (ref 101–111)
CO2: 28 mmol/L (ref 22–32)
CREATININE: 1.11 mg/dL — AB (ref 0.44–1.00)
Calcium: 10.5 mg/dL — ABNORMAL HIGH (ref 8.9–10.3)
GFR calc Af Amer: 56 mL/min — ABNORMAL LOW (ref >60)
GFR calc non Af Amer: 48 mL/min — ABNORMAL LOW (ref >60)
Glucose, Bld: 243 mg/dL — ABNORMAL HIGH (ref 65–99)
Potassium: 5.1 mmol/L (ref 3.5–5.1)
SODIUM: 141 mmol/L (ref 135–145)

## 2016-07-18 LAB — GLUCOSE, CAPILLARY
GLUCOSE-CAPILLARY: 107 mg/dL — AB (ref 65–99)
GLUCOSE-CAPILLARY: 137 mg/dL — AB (ref 65–99)
GLUCOSE-CAPILLARY: 157 mg/dL — AB (ref 65–99)
GLUCOSE-CAPILLARY: 260 mg/dL — AB (ref 65–99)
Glucose-Capillary: 247 mg/dL — ABNORMAL HIGH (ref 65–99)
Glucose-Capillary: 216 mg/dL — ABNORMAL HIGH (ref 65–99)
Glucose-Capillary: 193 mg/dL — ABNORMAL HIGH (ref 65–99)

## 2016-07-18 MED ORDER — METHYLPREDNISOLONE SODIUM SUCC 40 MG IJ SOLR
40.0000 mg | Freq: Every day | INTRAMUSCULAR | Status: DC
  Administered 2016-07-19 – 2016-07-20 (×2): 40 mg via INTRAVENOUS
  Filled 2016-07-18 (×2): qty 1

## 2016-07-18 MED ORDER — VITAL HIGH PROTEIN PO LIQD
1000.0000 mL | ORAL | Status: DC
  Administered 2016-07-19 – 2016-07-20 (×2): 1000 mL
  Filled 2016-07-18 (×6): qty 1000

## 2016-07-18 MED ORDER — INSULIN DETEMIR 100 UNIT/ML ~~LOC~~ SOLN
8.0000 [IU] | Freq: Every day | SUBCUTANEOUS | Status: DC
  Administered 2016-07-18 – 2016-07-22 (×4): 8 [IU] via SUBCUTANEOUS
  Filled 2016-07-18 (×6): qty 0.08

## 2016-07-18 MED ORDER — INSULIN ASPART 100 UNIT/ML ~~LOC~~ SOLN
4.0000 [IU] | SUBCUTANEOUS | Status: DC
  Administered 2016-07-18 – 2016-07-21 (×19): 4 [IU] via SUBCUTANEOUS

## 2016-07-18 NOTE — Progress Notes (Addendum)
PULMONARY / CRITICAL CARE MEDICINE   Name: [name] MRN: [id number] DOB: 11-02-1946    ADMISSION DATE:  07/10/2016 CONSULTATION DATE:  7/27  REFERRING MD:  Allred   CHIEF COMPLAINT:  Respiratory failure   HISTORY OF PRESENT ILLNESS:   70yo female former smoker, polymyalgia rheumatica, HTN, hypertrophic cardiomyopathy, CKD, AFib not candidate for anticoagulation r/t prior GI bleeding who was admitted 7/27 for elective TEE to evaluate L atrial appendage and place watchman device (left atrial appendage closure system for long term stroke prevention).  Pt was anemic on admit and decision was made to not proceed with watchman device (as it would require asa, coumadin for short time afterwards) but TEE was done to eval function and anatomy.  MD had significant difficulty passing the probe, which was eventually passed by anesthesia.  TEE showed ?stunned LV with severe hypokinesis and EF 20-25% and non obstructive HOCM.  However she had significant hypotension and increasing respiratory distress so she was intubated, started on pressors and tx to ICU post procedure.    SUBJECTIVE: No events overnight.  VITAL SIGNS: BP (!) 172/86   Pulse 74   Temp 99.4 F (37.4 C) (Oral)   Resp 16   Ht 5\' 5"  (1.651 m)   Wt 176 lb 5.9 oz (80 kg)   SpO2 100%   BMI 29.35 kg/m   HEMODYNAMICS:   VENTILATOR SETTINGS: Vent Mode: CPAP;PSV FiO2 (%):  [40 %] 40 % Set Rate:  [16 bmp] 16 bmp Vt Set:  [450 mL] 450 mL PEEP:  [5 cmH20] 5 cmH20 Pressure Support:  [10 cmH20-12 cmH20] 10 cmH20 Plateau Pressure:  [16 cmH20-17 cmH20] 16 cmH20  INTAKE / OUTPUT: I/O last 3 completed shifts: In: 2685.9 [I.V.:710.9; [id number] Out: 2925 [Urine:2925]  PHYSICAL EXAMINATION:  General:  No distress on vent Neuro: opens eyes to name, Follows commands HEENT:  ETT in place, moist mucus membranes Cardiovascular:  S1, S2, RRR, no MRG Lungs:  Clear, no wheeze or crackles  Abdomen:  Soft, + BS  Musculoskeletal:   Warm and dry, +1 edema   LABS:  BMET  Recent Labs Lab 07/16/16 0211 07/17/16 0234 07/18/16 0214  NA 141 141 141  K 4.5 4.8 5.1  CL 104 103 104  CO2 28 29 28   BUN 41* 51* 57*  CREATININE 1.12* 1.04* 1.11*  GLUCOSE 223* 219* 243*    Electrolytes  Recent Labs Lab 07/12/16 1830  07/15/16 0220 07/16/16 0211 07/17/16 0234 07/18/16 0214  CALCIUM  --   < > 10.0 9.8 10.3 10.5*  MG 2.6*  --  2.1  --  2.3  --   PHOS 3.2  --  3.2  --  3.2  --   < > = values in this interval not displayed.  CBC  Recent Labs Lab 07/16/16 0211 07/17/16 0234 07/18/16 0214  WBC 8.0 11.6* 10.3  HGB 9.0* 9.5* 10.1*  HCT 30.6* 32.3* 34.5*  PLT 196 250 259    Coag's No results for input(s): APTT, INR in the last 168 hours.  Sepsis Markers No results for input(s): LATICACIDVEN, PROCALCITON, O2SATVEN in the last 168 hours.  ABG  Recent Labs Lab 07/15/16 1625  PHART 7.433  PCO2ART 52.0*  PO2ART 138.0*    Liver Enzymes No results for input(s): AST, ALT, ALKPHOS, BILITOT, ALBUMIN in the last 168 hours.  Cardiac Enzymes No results for input(s): TROPONINI, PROBNP in the last 168 hours.  Glucose  Recent Labs Lab 07/17/16 0753 07/17/16 1157 07/17/16 1607 07/17/16 1936 07/17/16  [id number] 07/18/16 0433  GLUCAP 243* 243* 241* 230* 260* 247*    Imaging Dg Chest Port 1 View  Result Date: 07/18/2016 CLINICAL DATA:  Acute renal failure, respiratory failure, intubated patient, CHF. EXAM: PORTABLE CHEST 1 VIEW COMPARISON:  Portable chest x-ray of July 17, 2016 FINDINGS: The patient is rotated on today's study. The lungs are well-expanded. The interstitial markings are coarse. The infrahilar lung markings on the left remain increased. The cardiac silhouette is enlarged. A prosthetic valve ring in what appears to be the mitral position is demonstrated. The pulmonary vascularity is not clearly engorged. There is no pleural effusion. The endotracheal tube tip lies 5.5 cm above the carina. The  esophagogastric tube tip projects below the inferior margin of the image. The electrodes of the permanent pacemaker appear to be in stable position. IMPRESSION: Limited study due to persistent rotation toward the right. There is infrahilar infiltrate or atelectasis on the left not greatly changed. Cardiomegaly without pulmonary vascular congestion. No pleural effusion. The support tubes are in stable position. Electronically Signed   By: David  [name] M.D.   On: 07/18/2016 07:11   STUDIES:  TEE 7/27 >>  ?stunned LV with severe hypokinesis and EF 20-25% and non obstructive HOCM 2D echo 7/28 >> LVEF 55-60%, moderate asymmetric septal hypertrophy, intermittent diastolic dysfunction (AF), no RWMA, PA peak 62  CT soft tissue neck 7/27 >> pharynx is collapsed around ETT, ? Pharyngeal edema, no hematoma, no gas in soft tissue, extensive mucosal edema in paranasal sinuses, RUL atelectasis CT Head 7/27 >> no acute abnormality  CULTURES: MRSA 7/27 (-) Trach asp 7/27 >>  Blood 7/27 >>  ANTIBIOTICS: None  SIGNIFICANT EVENTS: 7/27 TEE. Went into resp distress and hypotension during TEE. Admit 2H  LINES/TUBES: ETT 7/27 >> R rad aline 7/27 >> 7/29  DISCUSSION: 70yo female with persistent AFib unable to be anticoagulated r/t previous GI bleeding and known hypertrophic cardiomyopathy admitted after hypotension and resp fx during TEE.  She had a very difficult reintubation after she developed stridor post extubation. She has edematous vocal cords that were almost closed shut.   ASSESSMENT / PLAN:  PULMONARY Acute hypoxemic hypercapneic respiratory failure secondary to inability to protect airway. Swollen airways after TEE.  Edematous vocal cords with very small entry. Pt with very difficult airway and has a size 6.5 ETT.  COPD - by history but she is not on meds and spirometry did not reveal COPD. Doubt she has an exacerbation now Likely untreated OSA / OHS Elevated PA Pressures on ECHO.  P:    Re intubated after stridor on 8/1 Appreciate ENT reccs. Plan for direct laryngoscopy under GA if no improvement She will need a trach if there is still airway compromise.  Check daily cuff leaks Reduce solumedrol to 40 mg qd . Start taper as it does not seem to be helping.  She is on 5 mg prednisone at baseline and has already received 7 days of IV steroids  CARDIOVASCULAR Stunned LV with significant hypokinesis EF 20-25%. S/P TEE 7/27 Chronic AFib - paced, not candidate for anticoagulation r/t previous GI bleeding  Complete heart block  P:  Cardiology following No anticoagulation due to GI bleed  Tele monitoring  RENAL CKD IIIB P:   Trend BMP / UOP  KVO IVF  GASTROINTESTINAL Hx GI bleed - no current s/s bleeding  P:   PPI  Monitor Hgb TF per nutrition  HEMATOLOGIC Anemia  P:  Trend CBC SCD's for DVT prophylaxis  Lovenox for  DVT prophylaxis  INFECTIOUS No active issue  P:   Monitor fever, WBC curve off abx   ENDOCRINE Chronic steroids (on 5 mg of pred daily) Hx polymyalgia rheumatica  P:   SSI while on steroids.  Start levemir 8 units daily. Novolog 3 units q4 hrs  NEUROLOGIC Concern for seizures but EEG was negative Unable to get MRI brain because of pacemaker P:   RASS goal: 0 Off AEDs as there is not more evidence of seizure activity Sedation while on vent.  FAMILY  - Updates:  Family updated 8/4. - Inter-disciplinary family meet or Palliative Care meeting due by:  8/3.  Critical care time- 35 mins.  Marshell Garfinkel MD  Pulmonary and Critical Care Pager [phone] If no answer or after 3pm call: [phone] 07/18/2016, 8:17 AM

## 2016-07-18 NOTE — Progress Notes (Signed)
SUBJECTIVE: The patient is intubated and sedated.  Responds to questions appropriately  CURRENT MEDICATIONS: . amLODipine  5 mg Per Tube Daily  . antiseptic oral rinse  7 mL Mouth Rinse 10 times per day  . chlorhexidine gluconate (SAGE KIT)  15 mL Mouth Rinse BID  . docusate  100 mg Oral Daily  . enoxaparin (LOVENOX) injection  40 mg Subcutaneous Q24H  . insulin aspart  0-15 Units Subcutaneous Q4H  . insulin aspart  4 Units Subcutaneous Q4H  . insulin detemir  8 Units Subcutaneous Daily  . ipratropium-albuterol  3 mL Nebulization Q6H  . lidocaine  15 mL Mouth/Throat Once  . [START ON 07/19/2016] methylPREDNISolone (SOLU-MEDROL) injection  40 mg Intravenous Daily  . pantoprazole sodium  40 mg Per Tube BID AC   . sodium chloride Stopped (07/17/16 0300)  . feeding supplement (VITAL HIGH PROTEIN) 1,000 mL (07/17/16 0700)  . fentaNYL infusion INTRAVENOUS 100 mcg/hr (07/18/16 0815)  . midazolam (VERSED) infusion Stopped (07/14/16 0844)    OBJECTIVE: Physical Exam: Vitals:   07/18/16 0712 07/18/16 0756 07/18/16 0800 07/18/16 0900  BP: (!) 159/82  (!) 172/86 132/70  Pulse: 75  74 75  Resp: _0 Temp:  99.4 F (37.4 C)    TempSrc:  Oral    SpO2: 100%  100% 100%  Weight:      Height:        Intake/Output Summary (Last 24 hours) at 07/18/16 1020 Last data filed at 07/18/16 0815  Gross per 24 hour  Intake          1493.41 ml  Output             1635 ml  Net          -141.59 ml    Telemetry reveals atrial fibrillation with V pacing, occasional PVC's   GEN- The patient is intubated, will open eyes to voice and follow commands Head- normocephalic, atraumatic Eyes-  Sclera clear, conjunctiva pink Ears- hearing intact Oropharynx- +ETT Neck- supple  Lungs- Clear to ausculation bilaterally, normal work of breathing Heart- Regular rate and rhythm (paced) GI- soft, NT, ND, + BS Extremities- no clubbing, cyanosis, or edema Skin- no rash or lesion    LABS: Basic  Metabolic Panel:  Recent Labs  07/17/16 0234 07/18/16 0214  NA 141 141  K 4.8 5.1  CL 103 104  CO2 29 28  GLUCOSE 219* 243*  BUN 51* 57*  CREATININE 1.04* 1.11*  CALCIUM 10.3 10.5*  MG 2.3  --   PHOS 3.2  --    CBC:  Recent Labs  07/17/16 0234 07/18/16 0214  WBC 11.6* 10.3  HGB 9.5* 10.1*  HCT 32.3* 34.5*  MCV 87.3 87.3  PLT 250 259    ASSESSMENT AND PLAN:  1.  Acute respiratory failure/hypotension/ stridor with extubation Vent management per CCM  Appreciate ENT   2.  Permanent atrial fibrillation No OAC 2/2 anemia With difficulty with sedation and intubation during TEE, she is at increased risk for procedures going forward, would be hesitant to pursue watchman in the future  3.  Complete heart block Normal PPM function this admission  4.  Chronic renal failure Stable  5.  Anemia Stable today   6.  Concern for AMS Appears resolved  Appreciate PCCM assistance and taking patient on their service No new EP recs Please call if pt needs to be seen again from CV standpoint.  Chanetta Marshall, NP 07/18/2016 10:20 AM  Jeneen Rinks Quilla Freeze,MD

## 2016-07-18 NOTE — Progress Notes (Signed)
Pt checked for cuff leak.  Not present at this time.

## 2016-07-18 NOTE — Progress Notes (Signed)
Inpatient Diabetes Program Recommendations  AACE/[name]: New Consensus Statement on Inpatient Glycemic Control (2015)  Target Ranges:  Prepandial:   less than 140 mg/dL      Peak postprandial:   less than 180 mg/dL (1-2 hours)      Critically ill patients:  140 - 180 mg/dL   Results for [name], [name] (MRN [id number]) as of 07/18/2016 07:45  Ref. Range 07/16/2016 23:47 07/17/2016 03:53 07/17/2016 07:53 07/17/2016 11:57 07/17/2016 16:07 07/17/2016 19:36  Glucose-Capillary Latest Ref Range: 65 - 99 mg/dL 232 (H) 202 (H) 243 (H) 243 (H) 241 (H) 230 (H)   Results for [name], [name] (MRN [id number]) as of 07/18/2016 07:45  Ref. Range 07/17/2016 23:57 07/18/2016 04:33  Glucose-Capillary Latest Ref Range: 65 - 99 mg/dL 260 (H) 247 (H)    Admit with: Respiratory Failure after TEE  History: Complete Heart Block, CKD  Current Insulin Orders: Novolog Moderate Correction Scale/ SSI (0-15 units) Q4 hours      -Patient currently receiving IV Solumedrol 40 mg Q6 hours.  -Glucose levels >200 mg/dl.  -Intubated and also receiving Vital HP tube feeds at 55 cc/hour.     MD- Please consider the following in-hospital insulin adjustments while patient receiving IV steroids and tube feedings:  Start ICU Glycemic Control Protocol (Phase 2 IV Insulin)  OR  Start Levemir 8 units daily (0.1 units/kg) and   Novolog tube feed coverage: Novolog 3 units Q4 hours (hold if tube feeds held for any reason)    --Will follow patient during hospitalization--  Wyn Quaker RN, MSN, CDE Diabetes Coordinator Inpatient Glycemic Control Team Team Pager: [phone] (8a-5p)

## 2016-07-18 NOTE — Progress Notes (Signed)
Nutrition Follow-up  INTERVENTION:   Increase Vital High Protein to 60 ml/hr 1440 ml, 1440 kcal, 126 grams protein, and 1203 ml H2O.   NUTRITION DIAGNOSIS:   Inadequate oral intake related to inability to eat as evidenced by NPO status. Ongoing.   GOAL:   Patient will meet greater than or equal to 90% of their needs Met.   MONITOR:   TF tolerance, I & O's, Labs, Vent status  ASSESSMENT:   70yo female with persistent AFib unable to be anticoagulated r/t previous GI bleeding and known hypertrophic cardiomyopathy admitted after hypotension and resp fx during TEE   No cuff leak, no plans for extubation today  Patient is currently intubated on ventilator support MV: 6.3 L/min Temp (24hrs), Avg:98.9 F (37.2 C), Min:98 F (36.7 C), Max:99.5 F (37.5 C)  Medications reviewed and include: colace, solumedrol Labs reviewed CBG's: 193-247 OG tube  Diet Order:  Diet NPO time specified  Skin:  Reviewed, no issues  Last BM:  unknown  Height:   Ht Readings from Last 1 Encounters:  07/10/16 '5\' 5"'  (1.651 m)    Weight:   Wt Readings from Last 1 Encounters:  07/18/16 176 lb 5.9 oz (80 kg)    Ideal Body Weight:  56.8 kg  BMI:  Body mass index is 29.35 kg/m.  Estimated Nutritional Needs:   Kcal:  1458  Protein:  100-120 grams  Fluid:  > 1.5 L/day  EDUCATION NEEDS:   No education needs identified at this time  [place], [place], [place] Pager [phone] After Hours Pager

## 2016-07-19 ENCOUNTER — Inpatient Hospital Stay (HOSPITAL_COMMUNITY): Payer: Medicare Other

## 2016-07-19 DIAGNOSIS — J384 Edema of larynx: Secondary | ICD-10-CM

## 2016-07-19 DIAGNOSIS — K5909 Other constipation: Secondary | ICD-10-CM

## 2016-07-19 LAB — PHOSPHORUS: PHOSPHORUS: 3.6 mg/dL (ref 2.5–4.6)

## 2016-07-19 LAB — GLUCOSE, CAPILLARY
GLUCOSE-CAPILLARY: 129 mg/dL — AB (ref 65–99)
GLUCOSE-CAPILLARY: 201 mg/dL — AB (ref 65–99)
GLUCOSE-CAPILLARY: 222 mg/dL — AB (ref 65–99)
Glucose-Capillary: 142 mg/dL — ABNORMAL HIGH (ref 65–99)
Glucose-Capillary: 155 mg/dL — ABNORMAL HIGH (ref 65–99)
Glucose-Capillary: 160 mg/dL — ABNORMAL HIGH (ref 65–99)

## 2016-07-19 LAB — CBC
HCT: 33.1 % — ABNORMAL LOW (ref 36.0–46.0)
Hemoglobin: 9.7 g/dL — ABNORMAL LOW (ref 12.0–15.0)
MCH: 25.9 pg — AB (ref 26.0–34.0)
MCHC: 29.3 g/dL — AB (ref 30.0–36.0)
MCV: 88.3 fL (ref 78.0–100.0)
PLATELETS: 248 10*3/uL (ref 150–400)
RBC: 3.75 MIL/uL — AB (ref 3.87–5.11)
RDW: 16 % — AB (ref 11.5–15.5)
WBC: 8.2 10*3/uL (ref 4.0–10.5)

## 2016-07-19 LAB — BLOOD GAS, ARTERIAL
Acid-Base Excess: 8.6 mmol/L — ABNORMAL HIGH (ref 0.0–2.0)
Bicarbonate: 32.2 mEq/L — ABNORMAL HIGH (ref 20.0–24.0)
DRAWN BY: 28340
FIO2: 0.4
MECHVT: 450 mL
O2 Saturation: 99.3 %
PEEP: 5 cmH2O
PO2 ART: 150 mmHg — AB (ref 80.0–100.0)
Patient temperature: 98.6
RATE: 16 resp/min
TCO2: 33.4 mmol/L (ref 0–100)
pCO2 arterial: 41 mmHg (ref 35.0–45.0)
pH, Arterial: 7.506 — ABNORMAL HIGH (ref 7.350–7.450)

## 2016-07-19 LAB — BASIC METABOLIC PANEL
Anion gap: 9 (ref 5–15)
BUN: 65 mg/dL — AB (ref 6–20)
CALCIUM: 10.7 mg/dL — AB (ref 8.9–10.3)
CO2: 31 mmol/L (ref 22–32)
CREATININE: 1.11 mg/dL — AB (ref 0.44–1.00)
Chloride: 104 mmol/L (ref 101–111)
GFR calc Af Amer: 56 mL/min — ABNORMAL LOW (ref >60)
GFR, EST NON AFRICAN AMERICAN: 48 mL/min — AB (ref >60)
GLUCOSE: 126 mg/dL — AB (ref 65–99)
POTASSIUM: 5 mmol/L (ref 3.5–5.1)
SODIUM: 144 mmol/L (ref 135–145)

## 2016-07-19 LAB — MAGNESIUM: MAGNESIUM: 2.4 mg/dL (ref 1.7–2.4)

## 2016-07-19 MED ORDER — SENNOSIDES 8.8 MG/5ML PO SYRP
5.0000 mL | ORAL_SOLUTION | Freq: Two times a day (BID) | ORAL | Status: DC
  Administered 2016-07-19 – 2016-07-22 (×7): 5 mL
  Filled 2016-07-19 (×14): qty 5

## 2016-07-19 MED ORDER — DOCUSATE SODIUM 50 MG/5ML PO LIQD
100.0000 mg | Freq: Two times a day (BID) | ORAL | Status: DC
  Administered 2016-07-19 – 2016-07-22 (×7): 100 mg
  Filled 2016-07-19 (×14): qty 10

## 2016-07-19 NOTE — Progress Notes (Signed)
Pt has a small but positive cuff leak.

## 2016-07-19 NOTE — Progress Notes (Signed)
PULMONARY / CRITICAL CARE MEDICINE   Name: [name] MRN: [id number] DOB: 05-23-1946    ADMISSION DATE:  07/10/2016 CONSULTATION DATE:  7/27  REFERRING MD:  Allred   CHIEF COMPLAINT:  Respiratory failure   HISTORY OF PRESENT ILLNESS:   70yo female former smoker, polymyalgia rheumatica, HTN, hypertrophic cardiomyopathy, CKD, AFib not candidate for anticoagulation r/t prior GI bleeding who was admitted 7/27 for elective TEE to evaluate L atrial appendage and place watchman device (left atrial appendage closure system for long term stroke prevention).  Pt was anemic on admit and decision was made to not proceed with watchman device (as it would require asa, coumadin for short time afterwards) but TEE was done to eval function and anatomy.  MD had significant difficulty passing the probe, which was eventually passed by anesthesia.  TEE showed ?stunned LV with severe hypokinesis and EF 20-25% and non obstructive HOCM.  However she had significant hypotension and increasing respiratory distress so she was intubated, started on pressors and tx to ICU post procedure.    SUBJECTIVE:  No events overnight. Does have some cuff leak this morning. Nurse reports still no bowel movement. Patient nods no to any pain or difficulty breathing but also is not consistently following commands.  REVIEW OF SYSTEMS:  Unable to obtain with patient intubated & sedated.  VITAL SIGNS: BP 97/62 (BP Location: Right Arm)   Pulse 75   Temp 98.5 F (36.9 C) (Oral)   Resp 16   Ht 5\' 5"  (1.651 m)   Wt 175 lb 7.8 oz (79.6 kg)   SpO2 100%   BMI 29.20 kg/m   HEMODYNAMICS:   VENTILATOR SETTINGS: Vent Mode: PRVC FiO2 (%):  [40 %] 40 % Set Rate:  [16 bmp] 16 bmp Vt Set:  [450 mL] 450 mL PEEP:  [5 cmH20] 5 cmH20 Pressure Support:  [5 cmH20] 5 cmH20 Plateau Pressure:  [21 cmH20-23 cmH20] 22 cmH20  INTAKE / OUTPUT: I/O last 3 completed shifts: In: 2522.3 [I.V.:450.3; NG/GT:2072] Out: 2610  [Urine:2610]  PHYSICAL EXAMINATION:  General:  Eyes open. Daughter at bedside. No distress. Neuro: Patient spontaneously moving all 4 extremities. Seems to nod appropriately to questions but not consistently following commands. HEENT:  Endotracheal tube in place. No scleral icterus or injection. Moist mucous membranes.  Cardiovascular:  Regular rate and rhythm. No appreciable edema.  Pulmonary: Symmetric chest wall rise on ventilator. Clear bilaterally to auscultation. Abdomen:   Soft. Mildly protuberant. Hypoactive bowel sounds.  Integument: Warm and dry. No rash on exposed skin.  LABS:  BMET  Recent Labs Lab 07/17/16 0234 07/18/16 0214 07/19/16 0330  NA 141 141 144  K 4.8 5.1 5.0  CL 103 104 104  CO2 29 28 31   BUN 51* 57* 65*  CREATININE 1.04* 1.11* 1.11*  GLUCOSE 219* 243* 126*    Electrolytes  Recent Labs Lab 07/15/16 0220  07/17/16 0234 07/18/16 0214 07/19/16 0330  CALCIUM 10.0  < > 10.3 10.5* 10.7*  MG 2.1  --  2.3  --  2.4  PHOS 3.2  --  3.2  --  3.6  < > = values in this interval not displayed.  CBC  Recent Labs Lab 07/17/16 0234 07/18/16 0214 07/19/16 0330  WBC 11.6* 10.3 8.2  HGB 9.5* 10.1* 9.7*  HCT 32.3* 34.5* 33.1*  PLT 250 259 248    Coag's No results for input(s): APTT, INR in the last 168 hours.  Sepsis Markers No results for input(s): LATICACIDVEN, PROCALCITON, O2SATVEN in the last 168  hours.  ABG  Recent Labs Lab 07/15/16 1625 07/19/16 0345  PHART 7.433 7.506*  PCO2ART 52.0* 41.0  PO2ART 138.0* 150*    Liver Enzymes No results for input(s): AST, ALT, ALKPHOS, BILITOT, ALBUMIN in the last 168 hours.  Cardiac Enzymes No results for input(s): TROPONINI, PROBNP in the last 168 hours.  Glucose  Recent Labs Lab 07/18/16 0755 07/18/16 1157 07/18/16 1634 07/18/16 2009 07/18/16 2347 07/19/16 0402  GLUCAP 216* 193* 157* 137* 107* 142*    Imaging Dg Chest Port 1 View  Result Date: 07/19/2016 CLINICAL DATA:  Acute  respiratory failure EXAM: PORTABLE CHEST 1 VIEW COMPARISON:  07/18/2016 FINDINGS: Endotracheal tube and NG tube unchanged. RIGHT-sided pacemaker overlies the mild enlarged cardiac silhouette. There is LEFT basilar atelectasis. Mild central venous congestion. No pulmonary edema. No pneumothorax. IMPRESSION: 1. Stable support apparatus. 2. Mild central venous congestion and LEFT basilar atelectasis. Electronically Signed   By: Suzy Bouchard M.D.   On: 07/19/2016 07:14   STUDIES:  TEE 7/27:  ?stunned LV with severe hypokinesis and EF 20-25% and non obstructive HOCM TTE 7/28: LVEF 55-60%, moderate asymmetric septal hypertrophy, intermittent diastolic dysfunction (AF), no RWMA, PA peak 62  CT soft tissue neck 7/27: pharynx is collapsed around ETT, ? Pharyngeal edema, no hematoma, no gas in soft tissue, extensive mucosal edema in paranasal sinuses, RUL atelectasis CT Head 7/27: no acute abnormality Port CXR 8/5:   MICROBIOLOGY: MRSA PCR 7/27:  Negative [place] Ctx x2 7/27:  Negative   ANTIBIOTICS: None  SIGNIFICANT EVENTS: 7/27 - TEE. Went into resp distress and hypotension during TEE. Admit 2H  LINES/TUBES: R rad aline 7/27 - 7/29 OETT 7.0 7/27 - 8/1; 8/1 (reintubated for vocal chord edema) >> Foley 7/27 >> OGT 8/1 >> PIV x2  ASSESSMENT / PLAN:  PULMONARY A: Acute Hypoxic Respiratory Failure - Secondary to complications from TEE. Vocal Cord Edema - VERY DIFFICULT INTUBATION H/O COPD - No obstruction on spirometry. Probable OSA/OHS Elevated PA Pressure on Echo  P:   Continuing PS wean NO EXTUBATION ENT following & plan for direct laryngoscpoy & possible tracheostomy Continue Solu-Medrol IV daily  CARDIOVASCULAR A: Stunned LV - S/P TEE 7/27 w/ hypokinesis EF 20-25%.  Chronic Atrial Fib - Paced. Not candidate for anticoagulation r/t previous GI bleeding.  Complete Heart Block  P:  Cardiology following No anticoagulation due to GI bleed  Continue telemetry  monitoring Vitals per unit protocol  RENAL A: H/O CKD Stage III  P:   Trending UOP Monitoring electrolytes and renal function daily Replacing electrolytes as indicated  GASTROINTESTINAL A: Constipation H/O GIB  P:   Protonix VT BID Continuing TF per nutrition Starting Senna bid VT Increase Colace to 100mg  bid VT  HEMATOLOGIC A: Anemia - No signs of active bleeding.  P:  Trending cell counts daily w/ CBC SCDs Lovenox [place] daily  INFECTIOUS A: No acute infection.  P:   Monitor off antibiotics Plan to culture for fever  ENDOCRINE A: Chronic Steroids - Prednisone 5mg  daily for PMR. Hyperglycemia - No h/o DM. Likely secondary to high dose steroids. Improved.   P:   Levemir 8u [place] daily Novolog 4u  q4hr Accu-checks q4hr SSI per Moderate Algorithm  NEUROLOGIC A: Possible Seizures - EEG negative. No MRI w/ pacemaker. Sedation on Ventilator  P:   RASS goal: 0 Fentanyl gtt Versed gtt & IV prn  FAMILY  - Updates:  Daughter updated 8/5 by Dr. Ashok Cordia. - Inter-disciplinary family meet or Palliative Care meeting due by:  8/3.  TODAY'S SUMMARY:  70 y.o. female with persistent AFib unable to be anticoagulated r/t previous GI bleeding and known hypertrophic cardiomyopathy admitted after hypotension and respiratory failure during TEE. Patient was extubated on 8/1 and then promptly reintubated due to stridor from vocal cord edema. Patient does have a cuff leak today which is encouraging. Increasing regimen to improve bowel function. Continuing with current sedation coals. ENT to assess with direct laryngoscopy in operating room prior to extubation. Continuing pressure support wean.    I have spent a total of 33 care time today caring for the patient, updating family at bedside, and reviewing the patient's electronic medical record.  Sonia Baller Ashok Cordia, M.D. [facility] Pulmonary & Critical Care Pager:  [phone] After 3pm or if no response, call [phone] 07/19/2016,  8:01 AM

## 2016-07-19 NOTE — Progress Notes (Signed)
Cuff leak heard

## 2016-07-20 DIAGNOSIS — K5900 Constipation, unspecified: Secondary | ICD-10-CM

## 2016-07-20 LAB — GLUCOSE, CAPILLARY
GLUCOSE-CAPILLARY: 138 mg/dL — AB (ref 65–99)
GLUCOSE-CAPILLARY: 267 mg/dL — AB (ref 65–99)
GLUCOSE-CAPILLARY: 160 mg/dL — AB (ref 65–99)
Glucose-Capillary: 163 mg/dL — ABNORMAL HIGH (ref 65–99)

## 2016-07-20 LAB — CBC WITH DIFFERENTIAL/PLATELET
BASOS PCT: 0 %
Basophils Absolute: 0 10*3/uL (ref 0.0–0.1)
Eosinophils Absolute: 0 10*3/uL (ref 0.0–0.7)
Eosinophils Relative: 0 %
HEMATOCRIT: 32.5 % — AB (ref 36.0–46.0)
HEMOGLOBIN: 9.4 g/dL — AB (ref 12.0–15.0)
LYMPHS ABS: 1.1 10*3/uL (ref 0.7–4.0)
Lymphocytes Relative: 15 %
MCH: 25.4 pg — AB (ref 26.0–34.0)
MCHC: 28.9 g/dL — AB (ref 30.0–36.0)
MCV: 87.8 fL (ref 78.0–100.0)
MONO ABS: 1 10*3/uL (ref 0.1–1.0)
MONOS PCT: 14 %
NEUTROS ABS: 5.2 10*3/uL (ref 1.7–7.7)
NEUTROS PCT: 71 %
Platelets: 270 10*3/uL (ref 150–400)
RBC: 3.7 MIL/uL — ABNORMAL LOW (ref 3.87–5.11)
RDW: 15.9 % — AB (ref 11.5–15.5)
WBC: 7.3 10*3/uL (ref 4.0–10.5)

## 2016-07-20 LAB — RENAL FUNCTION PANEL
ALBUMIN: 2.5 g/dL — AB (ref 3.5–5.0)
ANION GAP: 6 (ref 5–15)
BUN: 70 mg/dL — AB (ref 6–20)
CHLORIDE: 106 mmol/L (ref 101–111)
CO2: 33 mmol/L — AB (ref 22–32)
Calcium: 10 mg/dL (ref 8.9–10.3)
Creatinine, Ser: 1.16 mg/dL — ABNORMAL HIGH (ref 0.44–1.00)
GFR calc Af Amer: 53 mL/min — ABNORMAL LOW (ref >60)
GFR calc non Af Amer: 46 mL/min — ABNORMAL LOW (ref >60)
GLUCOSE: 101 mg/dL — AB (ref 65–99)
POTASSIUM: 5 mmol/L (ref 3.5–5.1)
Phosphorus: 4.3 mg/dL (ref 2.5–4.6)
Sodium: 145 mmol/L (ref 135–145)

## 2016-07-20 LAB — MAGNESIUM: Magnesium: 2.4 mg/dL (ref 1.7–2.4)

## 2016-07-20 MED ORDER — CHLORHEXIDINE GLUCONATE 4 % EX LIQD
CUTANEOUS | Status: AC
  Administered 2016-07-20: 4 via TOPICAL
  Filled 2016-07-20: qty 30

## 2016-07-20 MED ORDER — CHLORHEXIDINE GLUCONATE CLOTH 2 % EX PADS
6.0000 | MEDICATED_PAD | Freq: Once | CUTANEOUS | Status: AC
  Administered 2016-07-20: 6 via TOPICAL

## 2016-07-20 MED ORDER — SORBITOL 70 % SOLN
50.0000 mL | Freq: Once | Status: AC
  Administered 2016-07-20: 50 mL
  Filled 2016-07-20: qty 60

## 2016-07-20 MED ORDER — CHLORHEXIDINE GLUCONATE CLOTH 2 % EX PADS
6.0000 | MEDICATED_PAD | Freq: Once | CUTANEOUS | Status: AC
  Administered 2016-07-21: 6 via TOPICAL

## 2016-07-20 NOTE — Progress Notes (Signed)
PULMONARY / CRITICAL CARE MEDICINE   Name: [name] MRN: [id number] DOB: Apr 30, 1946    ADMISSION DATE:  07/10/2016 CONSULTATION DATE:  07/10/2016  REFERRING MD:  Allred   CHIEF COMPLAINT:  Respiratory failure   HISTORY OF PRESENT ILLNESS:   70yo female former smoker, polymyalgia rheumatica, HTN, hypertrophic cardiomyopathy, CKD, AFib not candidate for anticoagulation r/t prior GI bleeding who was admitted 7/27 for elective TEE to evaluate L atrial appendage and place watchman device (left atrial appendage closure system for long term stroke prevention).  Pt was anemic on admit and decision was made to not proceed with watchman device (as it would require asa, coumadin for short time afterwards) but TEE was done to eval function and anatomy.  MD had significant difficulty passing the probe, which was eventually passed by anesthesia.  TEE showed ?stunned LV with severe hypokinesis and EF 20-25% and non obstructive HOCM.  However she had significant hypotension and increasing respiratory distress so she was intubated, started on pressors and tx to ICU post procedure.    SUBJECTIVE:  No events overnight. Patient again has cuff leak today. Patient nods no to any pain or difficulty breathing.  REVIEW OF SYSTEMS:  Unable to obtain with patient intubated & sedated.  VITAL SIGNS: BP 100/65   Pulse 75   Temp 97.9 F (36.6 C) (Oral)   Resp 13   Ht 5\' 5"  (1.651 m)   Wt 179 lb 7.3 oz (81.4 kg)   SpO2 100%   BMI 29.86 kg/m   HEMODYNAMICS:   VENTILATOR SETTINGS: Vent Mode: CPAP;PSV FiO2 (%):  [40 %] 40 % Set Rate:  [16 bmp] 16 bmp Vt Set:  [450 mL] 450 mL PEEP:  [5 cmH20] 5 cmH20 Pressure Support:  [10 cmH20] 10 cmH20 Plateau Pressure:  [13 cmH20-16 cmH20] 13 cmH20  INTAKE / OUTPUT: I/O last 3 completed shifts: In: 2683.3 [I.V.:304.2; NG/GT:2379.2] Out: 3285 [Urine:3285]  PHYSICAL EXAMINATION:  General:  Eyes open. Daughter and son at bedside. No distress. Watching  TV. Neurology: Following commands. Nodding to questions. Moving all 4 extremities equally and grossly nonfocal. HEENT:  Endotracheal tube in place. No scleral icterus. Moist mucous membranes.  Cardiovascular:  Regular rate and rhythm. No appreciable edema.  Pulmonary: Symmetric chest wall rise on ventilator. Clear on auscultation. Abdomen:   Soft. Mildly protuberant. Hypoactive bowel sounds. Nontender. Integument: Warm and dry. No rash on exposed skin.  LABS:  BMET  Recent Labs Lab 07/18/16 0214 07/19/16 0330 07/20/16 0330  NA 141 144 145  K 5.1 5.0 5.0  CL 104 104 106  CO2 28 31 33*  BUN 57* 65* 70*  CREATININE 1.11* 1.11* 1.16*  GLUCOSE 243* 126* 101*    Electrolytes  Recent Labs Lab 07/17/16 0234 07/18/16 0214 07/19/16 0330 07/20/16 0330  CALCIUM 10.3 10.5* 10.7* 10.0  MG 2.3  --  2.4 2.4  PHOS 3.2  --  3.6 4.3    CBC  Recent Labs Lab 07/18/16 0214 07/19/16 0330 07/20/16 0330  WBC 10.3 8.2 7.3  HGB 10.1* 9.7* 9.4*  HCT 34.5* 33.1* 32.5*  PLT 259 248 270    Coag's No results for input(s): APTT, INR in the last 168 hours.  Sepsis Markers No results for input(s): LATICACIDVEN, PROCALCITON, O2SATVEN in the last 168 hours.  ABG  Recent Labs Lab 07/15/16 1625 07/19/16 0345  PHART 7.433 7.506*  PCO2ART 52.0* 41.0  PO2ART 138.0* 150*    Liver Enzymes  Recent Labs Lab 07/20/16 0330  ALBUMIN 2.5*  Cardiac Enzymes No results for input(s): TROPONINI, PROBNP in the last 168 hours.  Glucose  Recent Labs Lab 07/19/16 0732 07/19/16 1107 07/19/16 1536 07/19/16 1944 07/19/16 2345 07/20/16 0721  GLUCAP 155* 160* 222* 201* 129* 163*    Imaging No results found. STUDIES:  TEE 7/27:  ?stunned LV with severe hypokinesis and EF 20-25% and non obstructive HOCM TTE 7/28: LVEF 55-60%, moderate asymmetric septal hypertrophy, intermittent diastolic dysfunction (AF), no RWMA, PA peak 62  CT soft tissue neck 7/27: pharynx is collapsed around  ETT, ? Pharyngeal edema, no hematoma, no gas in soft tissue, extensive mucosal edema in paranasal sinuses, RUL atelectasis CT Head 7/27: no acute abnormality Port CXR 8/5:   MICROBIOLOGY: MRSA PCR 7/27:  Negative Blooc Ctx x2 7/27:  Negative   ANTIBIOTICS: None  SIGNIFICANT EVENTS: 7/27 - TEE. Went into resp distress and hypotension during TEE. Admit 2H 8/01 - Extubated & re-intubated for severe vocal cord edema  LINES/TUBES: R rad aline 7/27 - 7/29 OETT 7.0 7/27 - 8/1; 8/1 (reintubated for vocal cord edema) >> Foley 7/27 >> OGT 8/1 >> PIV x2  ASSESSMENT / PLAN:  PULMONARY A: Acute Hypoxic Respiratory Failure - Secondary to complications from TEE. Vocal Cord Edema - VERY DIFFICULT INTUBATION H/O COPD - No obstruction on spirometry. Probable OSA/OHS Elevated PA Pressure on Echo  P:   Continuing PS wean NO EXTUBATION ENT following & plan for direct laryngoscpoy & possible tracheostomy Continue Solu-Medrol IV daily  CARDIOVASCULAR A: Stunned LV - S/P TEE 7/27 w/ hypokinesis EF 20-25%.  Chronic Atrial Fib - Paced. Not candidate for anticoagulation r/t previous GI bleeding.  Complete Heart Block  P:  Cardiology following No anticoagulation due to GI bleed  Continue telemetry monitoring Vitals per unit protocol  RENAL A: Azotemia - Likely secondary to steroids. H/O CKD Stage III  P:   Trending UOP Monitoring electrolytes and renal function daily Replacing electrolytes as indicated  GASTROINTESTINAL A: Constipation H/O GIB  P:   Protonix VT BID Continuing TF per nutrition Senna bid VT Colace bid VT Sorbitol 42mL VT x1 May need Dulcolax Suppository  HEMATOLOGIC A: Anemia - No signs of active bleeding. Hgb stable.  P:  Trending cell counts daily w/ CBC SCDs Lovenox [place] daily  INFECTIOUS A: No acute infection.  P:   Monitor off antibiotics Plan to culture for fever  ENDOCRINE A: Chronic Steroids - Prednisone 5mg  daily for  PMR. Hyperglycemia - No h/o DM. Likely secondary to high dose steroids. Improved.   P:   Levemir 8u [place] daily Novolog 4u [place] q4hr Accu-checks q4hr SSI per Moderate Algorithm  NEUROLOGIC A: Possible Seizures - EEG negative. No MRI w/ pacemaker. Sedation on Ventilator  P:   RASS goal: 0 Fentanyl gtt Versed gtt & IV prn  FAMILY  - Updates:  Daughter & Son updated 8/6 by Dr. Ashok Cordia. - Inter-disciplinary family meet or Palliative Care meeting due by:  8/3.  TODAY'S SUMMARY:  70 y.o. female with persistent AFib unable to be anticoagulated r/t previous GI bleeding and known hypertrophic cardiomyopathy admitted after hypotension and respiratory failure during TEE. Patient was extubated on 8/1 and then promptly reintubated due to stridor from vocal cord edema. Cuff leak is continuing to improve. Plan for spontaneous breathing trial tomorrow morning. Assessment with direct laryngoscopy in the operating room by ENT prior to extubation. Giving 1 dose of sorbitol with the hope of bowel movement. Family updated at bedside at length.  I have spent a total of 31  care time today caring for the patient, updating family at bedside, and reviewing the patient's electronic medical record.  Sonia Baller Ashok Cordia, M.D. [facility] Pager:  [phone] After 3pm or if no response, call [phone] 07/20/2016, 10:51 AM

## 2016-07-20 NOTE — Progress Notes (Signed)
New airway was placed 07/15/2016 at 14:48 by NP due to stridor & airway protection.  The airway was not added to the flow sheet for proper documentation until today.

## 2016-07-20 NOTE — Progress Notes (Signed)
Pt has + cuff leak.

## 2016-07-20 NOTE — Progress Notes (Signed)
Pt has a positive cuff leak. 

## 2016-07-20 NOTE — Anesthesia Preprocedure Evaluation (Addendum)
Anesthesia Evaluation  Patient identified by MRN, date of birth, ID band  Reviewed: Allergy & Precautions, NPO status , Patient's Chart, lab work & pertinent test results, Unable to perform ROS - Chart review only  History of Anesthesia Complications (+) DIFFICULT AIRWAY and history of anesthetic complications  Airway Mallampati: Intubated      Comment: 6.5 oral ETT placed 8/1 with glidescope #4, 2 attempts Dental   Pulmonary sleep apnea , COPD, former smoker,  Acute hypoxic respiratory failure secondary to complications from TEE   breath sounds clear to auscultation       Cardiovascular hypertension, + angina +CHF  + dysrhythmias (not a candidate for anticoagulation due to history of GI bleed) Atrial Fibrillation + pacemaker ([employer], complete heart block)  Rhythm:Irregular  TEE 7/27:  ?stunned LV with severe hypokinesis and EF 20-25% and non obstructive HOCM  TTE 7/28: LVEF 55-60%, moderate asymmetric septal hypertrophy, intermittent diastolic dysfunction (AF), no RWMA, PA peak 62  Impressions: - The patient was in atrial fibrillation. Normal LV size with   moderate asymmetric septal hypertrophy. No LVOT gradient or   mitral valve SAM. EF 55-60%. This could be consistent with   hypertrophic nonobstructive cardiomyopathy. There was mild MR.   Mildly dilated RV with moderately decreased systolic function.   Moderate pulmonary hypertension.   Neuro/Psych PSYCHIATRIC DISORDERS Anxiety Depression Questionable seizures, EEG negative    GI/Hepatic GERD  ,(+)     substance abuse (h/o alcohol abuse)  , Fatty liver H/o GI bleed   Endo/Other  Hyperglycemia likely secondary to high-dose steroids for airway edema  Renal/GU CRFRenal disease (stage 3)     Musculoskeletal  (+) Arthritis , Polymyalgia rheumatic on chronic steroids (prednisone 5mg  daily)    Abdominal   Peds  Hematology  (+) Blood dyscrasia, anemia ,   Anesthesia  Other Findings 70yo female former smoker, polymyalgia rheumatica, HTN, hypertrophic cardiomyopathy, CKD, AFib not candidate for anticoagulation r/t prior GI bleeding who was admitted 7/27 for elective TEE to evaluate L atrial appendage and place watchman device (left atrial appendage closure system for long term stroke prevention).  Pt was anemic on admit and decision was made to not proceed with watchman device (as it would require asa, coumadin for short time afterwards) but TEE was done to eval function and anatomy.  MD had significant difficulty passing the probe, which was eventually passed by anesthesia.  TEE showed ?stunned LV with severe hypokinesis and EF 20-25% and non obstructive HOCM.  However she had significant hypotension and increasing respiratory distress so she was intubated, started on pressors and tx to ICU post procedure.    Reproductive/Obstetrics                            Anesthesia Physical Anesthesia Plan  ASA: IV  Anesthesia Plan: General   Post-op Pain Management:    Induction: Intravenous  Airway Management Planned: Oral ETT and Tracheostomy  Additional Equipment: Arterial line  Intra-op Plan:   Post-operative Plan:   Informed Consent: I have reviewed the patients History and Physical, chart, labs and discussed the procedure including the risks, benefits and alternatives for the proposed anesthesia with the patient or authorized representative who has indicated his/her understanding and acceptance.   History available from chart only  Plan Discussed with:   Anesthesia Plan Comments:        Anesthesia Quick Evaluation

## 2016-07-21 ENCOUNTER — Inpatient Hospital Stay (HOSPITAL_COMMUNITY): Payer: Medicare Other | Admitting: Certified Registered Nurse Anesthetist

## 2016-07-21 ENCOUNTER — Encounter (HOSPITAL_COMMUNITY)
Admission: AD | Disposition: A | Discharge status: Skilled Nursing Facility | Payer: Self-pay | Source: Ambulatory Visit | Attending: Pulmonary Disease

## 2016-07-21 HISTORY — PX: EXTUBATION (ENDOTRACHEAL) IN OR: SHX6588

## 2016-07-21 LAB — CBC WITH DIFFERENTIAL/PLATELET
Basophils Absolute: 0 10*3/uL (ref 0.0–0.1)
Basophils Relative: 0 %
EOS PCT: 0 %
Eosinophils Absolute: 0 10*3/uL (ref 0.0–0.7)
HCT: 32 % — ABNORMAL LOW (ref 36.0–46.0)
HEMOGLOBIN: 9.3 g/dL — AB (ref 12.0–15.0)
LYMPHS ABS: 1.1 10*3/uL (ref 0.7–4.0)
LYMPHS PCT: 14 %
MCH: 25.5 pg — AB (ref 26.0–34.0)
MCHC: 29.1 g/dL — AB (ref 30.0–36.0)
MCV: 87.9 fL (ref 78.0–100.0)
MONOS PCT: 9 %
Monocytes Absolute: 0.7 10*3/uL (ref 0.1–1.0)
NEUTROS PCT: 77 %
Neutro Abs: 6.1 10*3/uL (ref 1.7–7.7)
Platelets: 270 10*3/uL (ref 150–400)
RBC: 3.64 MIL/uL — AB (ref 3.87–5.11)
RDW: 15.9 % — ABNORMAL HIGH (ref 11.5–15.5)
WBC: 8 10*3/uL (ref 4.0–10.5)

## 2016-07-21 LAB — GLUCOSE, CAPILLARY
GLUCOSE-CAPILLARY: 101 mg/dL — AB (ref 65–99)
GLUCOSE-CAPILLARY: 145 mg/dL — AB (ref 65–99)
GLUCOSE-CAPILLARY: 128 mg/dL — AB (ref 65–99)
GLUCOSE-CAPILLARY: 183 mg/dL — AB (ref 65–99)
Glucose-Capillary: 129 mg/dL — ABNORMAL HIGH (ref 65–99)
Glucose-Capillary: 217 mg/dL — ABNORMAL HIGH (ref 65–99)

## 2016-07-21 LAB — RENAL FUNCTION PANEL
ANION GAP: 6 (ref 5–15)
Albumin: 2.4 g/dL — ABNORMAL LOW (ref 3.5–5.0)
BUN: 60 mg/dL — ABNORMAL HIGH (ref 6–20)
CHLORIDE: 105 mmol/L (ref 101–111)
CO2: 32 mmol/L (ref 22–32)
Calcium: 9.9 mg/dL (ref 8.9–10.3)
Creatinine, Ser: 1.16 mg/dL — ABNORMAL HIGH (ref 0.44–1.00)
GFR calc non Af Amer: 46 mL/min — ABNORMAL LOW (ref >60)
GFR, EST AFRICAN AMERICAN: 53 mL/min — AB (ref >60)
Glucose, Bld: 97 mg/dL (ref 65–99)
POTASSIUM: 4.9 mmol/L (ref 3.5–5.1)
Phosphorus: 3.1 mg/dL (ref 2.5–4.6)
Sodium: 143 mmol/L (ref 135–145)

## 2016-07-21 LAB — MAGNESIUM: Magnesium: 2.3 mg/dL (ref 1.7–2.4)

## 2016-07-21 SURGERY — REMOVAL, ENDOTRACHEAL TUBE
Anesthesia: General | Site: Mouth

## 2016-07-21 MED ORDER — OXYMETAZOLINE HCL 0.05 % NA SOLN
NASAL | Status: DC | PRN
  Administered 2016-07-21: 1

## 2016-07-21 MED ORDER — DEXAMETHASONE SODIUM PHOSPHATE 10 MG/ML IJ SOLN
INTRAMUSCULAR | Status: DC | PRN
  Administered 2016-07-21: 10 mg via INTRAVENOUS

## 2016-07-21 MED ORDER — PANTOPRAZOLE SODIUM 40 MG PO TBEC
40.0000 mg | DELAYED_RELEASE_TABLET | Freq: Two times a day (BID) | ORAL | Status: DC
  Administered 2016-07-21 – 2016-07-26 (×10): 40 mg via ORAL
  Filled 2016-07-21 (×11): qty 1

## 2016-07-21 MED ORDER — IBUPROFEN 100 MG/5ML PO SUSP
400.0000 mg | Freq: Four times a day (QID) | ORAL | Status: DC | PRN
  Filled 2016-07-21: qty 20

## 2016-07-21 MED ORDER — PROPOFOL 10 MG/ML IV BOLUS
INTRAVENOUS | Status: AC
  Filled 2016-07-21: qty 20

## 2016-07-21 MED ORDER — DEXAMETHASONE SODIUM PHOSPHATE 10 MG/ML IJ SOLN
INTRAMUSCULAR | Status: AC
  Filled 2016-07-21: qty 1

## 2016-07-21 MED ORDER — FUROSEMIDE 10 MG/ML IJ SOLN
40.0000 mg | Freq: Once | INTRAMUSCULAR | Status: AC
  Administered 2016-07-21: 40 mg via INTRAVENOUS
  Filled 2016-07-21: qty 4

## 2016-07-21 MED ORDER — LIDOCAINE HCL 2 % EX GEL
CUTANEOUS | Status: AC
  Filled 2016-07-21: qty 20

## 2016-07-21 MED ORDER — PHENYLEPHRINE HCL 10 MG/ML IJ SOLN
INTRAMUSCULAR | Status: DC | PRN
  Administered 2016-07-21: 80 ug via INTRAVENOUS

## 2016-07-21 MED ORDER — LIDOCAINE 2% (20 MG/ML) 5 ML SYRINGE
INTRAMUSCULAR | Status: AC
  Filled 2016-07-21: qty 5

## 2016-07-21 MED ORDER — AMLODIPINE BESYLATE 5 MG PO TABS
5.0000 mg | ORAL_TABLET | Freq: Every day | ORAL | Status: DC
  Administered 2016-07-22: 5 mg via ORAL
  Filled 2016-07-21 (×2): qty 1

## 2016-07-21 MED ORDER — LIDOCAINE HCL 2 % EX GEL
CUTANEOUS | Status: DC | PRN
  Administered 2016-07-21: 1

## 2016-07-21 MED ORDER — METHYLPREDNISOLONE SODIUM SUCC 40 MG IJ SOLR
20.0000 mg | Freq: Every day | INTRAMUSCULAR | Status: DC
  Administered 2016-07-21: 20 mg via INTRAVENOUS
  Filled 2016-07-21: qty 1

## 2016-07-21 MED ORDER — ENSURE ENLIVE PO LIQD
237.0000 mL | Freq: Two times a day (BID) | ORAL | Status: DC
  Administered 2016-07-22 – 2016-07-25 (×5): 237 mL via ORAL

## 2016-07-21 MED ORDER — PHENYLEPHRINE 40 MCG/ML (10ML) SYRINGE FOR IV PUSH (FOR BLOOD PRESSURE SUPPORT)
PREFILLED_SYRINGE | INTRAVENOUS | Status: AC
  Filled 2016-07-21: qty 10

## 2016-07-21 MED ORDER — TRIAMCINOLONE ACETONIDE 40 MG/ML IJ SUSP
INTRAMUSCULAR | Status: AC
  Filled 2016-07-21: qty 5

## 2016-07-21 MED ORDER — POLYETHYLENE GLYCOL 3350 17 G PO PACK
17.0000 g | PACK | Freq: Every day | ORAL | Status: DC
  Administered 2016-07-21 – 2016-07-26 (×3): 17 g via ORAL
  Filled 2016-07-21 (×3): qty 1

## 2016-07-21 MED ORDER — CHLORHEXIDINE GLUCONATE 4 % EX LIQD
CUTANEOUS | Status: AC
  Administered 2016-07-21: 2
  Filled 2016-07-21: qty 30

## 2016-07-21 MED ORDER — LIDOCAINE VISCOUS 2 % MT SOLN: OROMUCOSAL | Status: DC | PRN

## 2016-07-21 MED ORDER — LIDOCAINE-EPINEPHRINE 1 %-1:100000 IJ SOLN
INTRAMUSCULAR | Status: DC | PRN
Start: 2016-07-21 — End: 2016-07-21
  Administered 2016-07-21: 20 mL

## 2016-07-21 MED ORDER — BISACODYL 10 MG RE SUPP
10.0000 mg | Freq: Once | RECTAL | Status: AC
  Administered 2016-07-21: 10 mg via RECTAL
  Filled 2016-07-21: qty 1

## 2016-07-21 MED ORDER — CETYLPYRIDINIUM CHLORIDE 0.05 % MT LIQD
7.0000 mL | Freq: Two times a day (BID) | OROMUCOSAL | Status: DC
  Administered 2016-07-21 – 2016-07-26 (×6): 7 mL via OROMUCOSAL

## 2016-07-21 MED ORDER — OXYMETAZOLINE HCL 0.05 % NA SOLN
NASAL | Status: AC
  Filled 2016-07-21: qty 15

## 2016-07-21 MED ORDER — LACTATED RINGERS IV SOLN
INTRAVENOUS | Status: DC | PRN
  Administered 2016-07-21: 11:00:00 via INTRAVENOUS

## 2016-07-21 MED ORDER — PHENYLEPHRINE HCL 10 MG/ML IJ SOLN
INTRAMUSCULAR | Status: DC | PRN
  Administered 2016-07-21: 20 ug/min via INTRAVENOUS

## 2016-07-21 MED ORDER — MIDAZOLAM HCL 5 MG/5ML IJ SOLN
INTRAMUSCULAR | Status: DC | PRN
  Administered 2016-07-21: 2 mg via INTRAVENOUS

## 2016-07-21 MED ORDER — MIDAZOLAM HCL 2 MG/2ML IJ SOLN
INTRAMUSCULAR | Status: AC
  Filled 2016-07-21: qty 2

## 2016-07-21 MED ORDER — LIDOCAINE-EPINEPHRINE 1 %-1:100000 IJ SOLN
INTRAMUSCULAR | Status: AC
  Filled 2016-07-21: qty 1

## 2016-07-21 SURGICAL SUPPLY — 47 items
BALLN PULM 15 16.5 18 X 75CM (BALLOONS)
BALLN PULM 15 16.5 18X75 (BALLOONS)
BALLOON PULM 15 16.5 18X75 (BALLOONS) IMPLANT
BLADE SURG 15 STRL LF DISP TIS (BLADE) IMPLANT
BLADE SURG 15 STRL SS (BLADE)
BLADE SURG ROTATE 9660 (MISCELLANEOUS) IMPLANT
CANISTER SUCTION 2500CC (MISCELLANEOUS) ×4 IMPLANT
CLEANER TIP ELECTROSURG 2X2 (MISCELLANEOUS) ×4 IMPLANT
CONT SPEC 4OZ CLIKSEAL STRL BL (MISCELLANEOUS) IMPLANT
COVER MAYO STAND STRL (DRAPES) ×4 IMPLANT
COVER SURGICAL LIGHT HANDLE (MISCELLANEOUS) ×4 IMPLANT
COVER TABLE BACK 60X90 (DRAPES) ×4 IMPLANT
CRADLE DONUT ADULT HEAD (MISCELLANEOUS) IMPLANT
DECANTER SPIKE VIAL GLASS SM (MISCELLANEOUS) ×4 IMPLANT
DRAPE PROXIMA HALF (DRAPES) ×4 IMPLANT
ELECT COATED BLADE 2.86 ST (ELECTRODE) ×4 IMPLANT
ELECT REM PT RETURN 9FT ADLT (ELECTROSURGICAL) ×4
ELECTRODE REM PT RTRN 9FT ADLT (ELECTROSURGICAL) ×2 IMPLANT
GLOVE BIOGEL PI IND STRL 6.5 (GLOVE) ×4 IMPLANT
GLOVE BIOGEL PI INDICATOR 6.5 (GLOVE) ×4
GLOVE ECLIPSE 8.0 STRL XLNG CF (GLOVE) ×8 IMPLANT
GLOVE SURG SS PI 6.5 STRL IVOR (GLOVE) ×4 IMPLANT
GOWN BRE IMP SLV AUR LG STRL (GOWN DISPOSABLE) IMPLANT
GOWN STRL REUS W/ TWL LRG LVL3 (GOWN DISPOSABLE) ×2 IMPLANT
GOWN STRL REUS W/ TWL XL LVL3 (GOWN DISPOSABLE) ×2 IMPLANT
GOWN STRL REUS W/TWL LRG LVL3 (GOWN DISPOSABLE) ×2
GOWN STRL REUS W/TWL XL LVL3 (GOWN DISPOSABLE) ×2
GUARD TEETH (MISCELLANEOUS) ×4 IMPLANT
KIT BASIN OR (CUSTOM PROCEDURE TRAY) ×4 IMPLANT
KIT ROOM TURNOVER OR (KITS) ×4 IMPLANT
NEEDLE HYPO 25GX1X1/2 BEV (NEEDLE) ×4 IMPLANT
NS IRRIG 1000ML POUR BTL (IV SOLUTION) ×4 IMPLANT
PAD ARMBOARD 7.5X6 YLW CONV (MISCELLANEOUS) ×8 IMPLANT
PATTIES SURGICAL .5 X3 (DISPOSABLE) IMPLANT
PENCIL BUTTON HOLSTER BLD 10FT (ELECTRODE) ×4 IMPLANT
SOLUTION ANTI FOG 6CC (MISCELLANEOUS) IMPLANT
SPONGE DRAIN TRACH 4X4 STRL 2S (GAUZE/BANDAGES/DRESSINGS) ×4 IMPLANT
SURGILUBE 2OZ TUBE FLIPTOP (MISCELLANEOUS) IMPLANT
SUT CHROMIC 2 0 SH (SUTURE) ×4 IMPLANT
SUT ETHILON 2 0 FS 18 (SUTURE) IMPLANT
SUT SILK 2 0 SH CR/8 (SUTURE) ×4 IMPLANT
SYR INFLATE BILIARY GAUGE (MISCELLANEOUS) IMPLANT
TOWEL OR 17X24 6PK STRL BLUE (TOWEL DISPOSABLE) ×4 IMPLANT
TRAY ENT MC OR (CUSTOM PROCEDURE TRAY) ×4 IMPLANT
TUBE CONNECTING 12'X1/4 (SUCTIONS) ×1
TUBE CONNECTING 12X1/4 (SUCTIONS) ×3 IMPLANT
WATER STERILE IRR 1000ML POUR (IV SOLUTION) ×4 IMPLANT

## 2016-07-21 NOTE — Anesthesia Procedure Notes (Signed)
Date/Time: 07/21/2016 10:27 AM Performed by: Garrison [place] T Pre-anesthesia Checklist: Patient identified, Emergency Drugs available, Suction available and Patient being monitored Patient Re-evaluated:Patient Re-evaluated prior to inductionOxygen Delivery Method: Circle system utilized Preoxygenation: Pre-oxygenation with 100% oxygen Intubation Type: IV induction Placement Confirmation: positive ETCO2 and breath sounds checked- equal and bilateral Tube secured with: Tape Dental Injury: Teeth and Oropharynx as per pre-operative assessment

## 2016-07-21 NOTE — Transfer of Care (Signed)
Immediate Anesthesia Transfer of Care Note  Patient: [name]  Procedure(s) Performed: Procedure(s): EXTUBATION (ENDOTRACHEAL) IN OR (N/A)  Patient Location: ICU  Anesthesia Type:General  Level of Consciousness: awake, alert  and oriented  Airway & Oxygen Therapy: Patient Spontanous Breathing and Patient connected to nasal cannula oxygen  Post-op Assessment: Report given to RN, Post -op Vital signs reviewed and stable and Patient moving all extremities X 4  Post vital signs: Reviewed and stable  Last Vitals:  Vitals:   07/21/16 0800 07/21/16 0911  BP:  127/85  Pulse:  75  Resp:  16  Temp: 36.6 C     Last Pain:  Vitals:   07/21/16 0800  TempSrc: Oral  PainSc:          Complications: No apparent anesthesia complications

## 2016-07-21 NOTE — Interval H&P Note (Signed)
History and Physical Interval Note:  07/21/2016 10:41 AM  [name]  has presented today for surgery, with the diagnosis of LARGYNEGEAL ADEMA  The various methods of treatment have been discussed with the patient and family. After consideration of risks, benefits and other options for treatment, the patient has consented to  Procedure(s): DIRECT LARYNGOSCOPY (N/A) POSSIBLE TRACHEOSTOMY (N/A) as a surgical intervention .  The patient's history has been re-reviewed, patient re-examined, no change in status, stable for surgery.  I have re-reviewed the patient's chart and labs.  Questions were answered to the patient's satisfaction.     [name]

## 2016-07-21 NOTE — Progress Notes (Signed)
07/21/2016 5:17 PM  [place], [name] [id number]  Post-Op Check   Temp:  [97.5 F (36.4 C)-98.8 F (37.1 C)] 97.8 F (36.6 C) (08/07 0800) Pulse Rate:  [74-75] 75 (08/07 1436) Resp:  [14-31] 17 (08/07 1436) BP: (92-156)/(58-89) 112/58 (08/07 1436) SpO2:  [99 %-100 %] 100 % (08/07 1436) Arterial Line BP: (94-117)/(52-70) 108/54 (08/07 1330) FiO2 (%):  [40 %] 40 % (08/07 0911) Weight:  [80.1 kg (176 lb 9.4 oz)] 80.1 kg (176 lb 9.4 oz) (08/07 0500),     Intake/Output Summary (Last 24 hours) at 07/21/16 1717 Last data filed at 07/21/16 1134  Gross per 24 hour  Intake              945 ml  Output             1655 ml  Net             -710 ml    Results for orders placed or performed during the hospital encounter of 07/10/16 (from the past 24 hour(s))  Glucose, capillary     Status: Abnormal   Collection Time: 07/20/16  7:59 PM  Result Value Ref Range   Glucose-Capillary 160 (H) 65 - 99 mg/dL   Comment 1 Capillary Specimen   Glucose, capillary     Status: Abnormal   Collection Time: 07/20/16 11:53 PM  Result Value Ref Range   Glucose-Capillary 129 (H) 65 - 99 mg/dL   Comment 1 Capillary Specimen   Magnesium     Status: None   Collection Time: 07/21/16  2:49 AM  Result Value Ref Range   Magnesium 2.3 1.7 - 2.4 mg/dL  CBC with Differential/Platelet     Status: Abnormal   Collection Time: 07/21/16  2:49 AM  Result Value Ref Range   WBC 8.0 4.0 - 10.5 K/uL   RBC 3.64 (L) 3.87 - 5.11 MIL/uL   Hemoglobin 9.3 (L) 12.0 - 15.0 g/dL   HCT 32.0 (L) 36.0 - 46.0 %   MCV 87.9 78.0 - 100.0 fL   MCH 25.5 (L) 26.0 - 34.0 pg   MCHC 29.1 (L) 30.0 - 36.0 g/dL   RDW 15.9 (H) 11.5 - 15.5 %   Platelets 270 150 - 400 K/uL   Neutrophils Relative % 77 %   Neutro Abs 6.1 1.7 - 7.7 K/uL   Lymphocytes Relative 14 %   Lymphs Abs 1.1 0.7 - 4.0 K/uL   Monocytes Relative 9 %   Monocytes Absolute 0.7 0.1 - 1.0 K/uL   Eosinophils Relative 0 %   Eosinophils Absolute 0.0 0.0 - 0.7 K/uL   Basophils Relative 0 %   Basophils Absolute 0.0 0.0 - 0.1 K/uL  Renal function panel     Status: Abnormal   Collection Time: 07/21/16  2:49 AM  Result Value Ref Range   Sodium 143 135 - 145 mmol/L   Potassium 4.9 3.5 - 5.1 mmol/L   Chloride 105 101 - 111 mmol/L   CO2 32 22 - 32 mmol/L   Glucose, Bld 97 65 - 99 mg/dL   BUN 60 (H) 6 - 20 mg/dL   Creatinine, Ser 1.16 (H) 0.44 - 1.00 mg/dL   Calcium 9.9 8.9 - 10.3 mg/dL   Phosphorus 3.1 2.5 - 4.6 mg/dL   Albumin 2.4 (L) 3.5 - 5.0 g/dL   GFR calc non Af Amer 46 (L) >60 mL/min   GFR calc Af Amer 53 (L) >60 mL/min   Anion gap 6 5 - 15  Glucose, capillary  Status: Abnormal   Collection Time: 07/21/16  3:44 AM  Result Value Ref Range   Glucose-Capillary 101 (H) 65 - 99 mg/dL   Comment 1 Capillary Specimen   Glucose, capillary     Status: Abnormal   Collection Time: 07/21/16  8:19 AM  Result Value Ref Range   Glucose-Capillary 145 (H) 65 - 99 mg/dL  Glucose, capillary     Status: Abnormal   Collection Time: 07/21/16 10:07 AM  Result Value Ref Range   Glucose-Capillary 128 (H) 65 - 99 mg/dL   Comment 1 Notify RN   Glucose, capillary     Status: Abnormal   Collection Time: 07/21/16  4:54 PM  Result Value Ref Range   Glucose-Capillary 217 (H) 65 - 99 mg/dL    SUBJECTIVE:  Min pain.  Breathing well.  Voice starting to work.  OBJECTIVE:  Voice weak but phonatory. Eating ice chips.  Breathing easily  IMPRESSION:  satisfatory check  PLAN:   No change in plans.  Cover GERD.  Advance diet and activity. Call me for further assistance.  [name]

## 2016-07-21 NOTE — Progress Notes (Signed)
PULMONARY / CRITICAL CARE MEDICINE   Name: [name] MRN: [id number] DOB: 14-Aug-1946    ADMISSION DATE:  07/10/2016 CONSULTATION DATE:  07/10/2016  REFERRING MD:  Allred   CHIEF COMPLAINT:  Respiratory failure   HISTORY OF PRESENT ILLNESS:   70yo female former smoker, polymyalgia rheumatica, HTN, hypertrophic cardiomyopathy, CKD, AFib not candidate for anticoagulation r/t prior GI bleeding who was admitted 7/27 for elective TEE to evaluate L atrial appendage and place watchman device (left atrial appendage closure system for long term stroke prevention).  Pt was anemic on admit and decision was made to not proceed with watchman device (as it would require asa, coumadin for short time afterwards) but TEE was done to eval function and anatomy.  MD had significant difficulty passing the probe, which was eventually passed by anesthesia.  TEE showed ?stunned LV with severe hypokinesis and EF 20-25% and non obstructive HOCM.  However she had significant hypotension and increasing respiratory distress so she was intubated, started on pressors and tx to ICU post procedure.    SUBJECTIVE: ready for OR, had a leak, remains on vent, am anxiety   VITAL SIGNS: BP (!) 156/89   Pulse 75   Temp (P) 97.8 F (36.6 C) (Oral)   Resp (!) 25   Ht 5\' 5"  (1.651 m)   Wt 80.1 kg (176 lb 9.4 oz)   SpO2 100%   BMI 29.39 kg/m   HEMODYNAMICS:   VENTILATOR SETTINGS: Vent Mode: CPAP;PSV FiO2 (%):  [40 %] (P) 40 % Set Rate:  [16 bmp] 16 bmp Vt Set:  [450 mL] 450 mL PEEP:  [5 cmH20] 5 cmH20 Pressure Support:  [8 cmH20-10 cmH20] 8 cmH20 Plateau Pressure:  [13 cmH20-17 cmH20] 13 cmH20  INTAKE / OUTPUT: I/O last 3 completed shifts: In: 2340.7 [I.V.:360.7; NG/GT:1980] Out: 2880 [Urine:2880]  PHYSICAL EXAMINATION:  General:  Eyes open on vent, fc Neurology: Following commands well, nonfocal. HEENT: ett Cardiovascular: s 1 s2 RRR Pulmonary: CTA, int scattered ronchi Abdomen:   Soft. Mildly  protuberant. Hypoactive bowel sounds. Nontender. Integument: Warm and dry. No rash on exposed skin.  LABS:  BMET  Recent Labs Lab 07/19/16 0330 07/20/16 0330 07/21/16 0249  NA 144 145 143  K 5.0 5.0 4.9  CL 104 106 105  CO2 31 33* 32  BUN 65* 70* 60*  CREATININE 1.11* 1.16* 1.16*  GLUCOSE 126* 101* 97    Electrolytes  Recent Labs Lab 07/19/16 0330 07/20/16 0330 07/21/16 0249  CALCIUM 10.7* 10.0 9.9  MG 2.4 2.4 2.3  PHOS 3.6 4.3 3.1    CBC  Recent Labs Lab 07/19/16 0330 07/20/16 0330 07/21/16 0249  WBC 8.2 7.3 8.0  HGB 9.7* 9.4* 9.3*  HCT 33.1* 32.5* 32.0*  PLT 248 270 270    Coag's No results for input(s): APTT, INR in the last 168 hours.  Sepsis Markers No results for input(s): LATICACIDVEN, PROCALCITON, O2SATVEN in the last 168 hours.  ABG  Recent Labs Lab 07/15/16 1625 07/19/16 0345  PHART 7.433 7.506*  PCO2ART 52.0* 41.0  PO2ART 138.0* 150*    Liver Enzymes  Recent Labs Lab 07/20/16 0330 07/21/16 0249  ALBUMIN 2.5* 2.4*    Cardiac Enzymes No results for input(s): TROPONINI, PROBNP in the last 168 hours.  Glucose  Recent Labs Lab 07/20/16 1134 07/20/16 1540 07/20/16 1959 07/20/16 2353 07/21/16 0344 07/21/16 0819  GLUCAP 138* 267* 160* 129* 101* 145*    Imaging No results found. STUDIES:  TEE 7/27:  ?stunned LV with severe hypokinesis  and EF 20-25% and non obstructive HOCM TTE 7/28: LVEF 55-60%, moderate asymmetric septal hypertrophy, intermittent diastolic dysfunction (AF), no RWMA, PA peak 62  CT soft tissue neck 7/27: pharynx is collapsed around ETT, ? Pharyngeal edema, no hematoma, no gas in soft tissue, extensive mucosal edema in paranasal sinuses, RUL atelectasis CT Head 7/27: no acute abnormality Port CXR 8/5:   MICROBIOLOGY: MRSA PCR 7/27:  Negative Blooc Ctx x2 7/27:  Negative   ANTIBIOTICS: None  SIGNIFICANT EVENTS: 7/27 - TEE. Went into resp distress and hypotension during TEE. Admit 2H 8/01 -  Extubated & re-intubated for severe vocal cord edema  LINES/TUBES: R rad aline 7/27 - 7/29 OETT 7.0 7/27 - 8/1; 8/1 (reintubated for vocal cord edema) >> Foley 7/27 >> OGT 8/1 >> PIV x2  ASSESSMENT / PLAN:  PULMONARY A: Acute Hypoxic Respiratory Failure - Secondary to complications from TEE. Vocal Cord Edema - VERY DIFFICULT INTUBATION H/O COPD - No obstruction on spirometry. Probable OSA/OHS Elevated PA Pressure on Echo  P:   Continuing PS wean, cpap 5 ps 5, goal 30 min  ENT following & plan for direct laryngoscpoy this am  Continue Solu-Medrol IV but reduce uproght Even balance  CARDIOVASCULAR A: Stunned LV - S/P TEE 7/27 w/ hypokinesis EF 20-25%.  Chronic Atrial Fib - Paced. Not candidate for anticoagulation r/t previous GI bleeding.  Complete Heart Block  P:  Cardiology following No anticoagulation due to GI bleed  Continue telemetry monitoring  RENAL A: Azotemia - Likely secondary to steroids. H/O CKD Stage III  P:   K mild up, repeat in am  kvo Follow urine output closely Lasix x 1  GASTROINTESTINAL A: Constipation H/O GIB  P:   Protonix VT BID Continuing TF per nutrition Senna bid VT Colace bid VT Sorbitol 81mL VT x1-dc Add mir Add Dulcolax Suppository  HEMATOLOGIC A: Anemia - No signs of active bleeding. Hgb stable. DVt prevention P:  Cbc in am  SCDs Lovenox [place] daily-follow clearance  INFECTIOUS A: No acute infection.  P:   Follow fever curve  ENDOCRINE A: Chronic Steroids - Prednisone 5mg  daily for PMR. Hyperglycemia - No h/o DM. Likely secondary to high dose steroids. Improved.   P:   Levemir 8u [place] daily Novolog 4u [place] q4hr Accu-checks q4hr SSI per Moderate Algorithm Slight reduction solumedral  NEUROLOGIC A: Possible Seizures - EEG negative. No MRI w/ pacemaker. Sedation on Ventilator  P:   RASS goal: 0 Fentanyl gtt- will need full WUA Versed prn  FAMILY  - Updates:  Daughter & Son updated by me -  Inter-disciplinary family meet or Palliative Care meeting due by:  8/3.  Ccm time 30 min   Lavon Paganini. Titus Mould, MD, [place] Pgr: [place] [facility]

## 2016-07-21 NOTE — Op Note (Signed)
07/21/2016  11:34 AM    [name]  [id number]   Pre-Op Dx:  Prolonged intubation. Laryngeal edema  Post-op Dx: Same  Proc: Extubation under observation in the operating room, flexible laryngoscopy   Surg:  [name] T MD  Anes:  None  EBL:  None  Comp:  None  Findings:  Mild-moderate supraglottic edema. Mild mucositis of the endolarynx consistent with prolonged intubation. Mobile vocal cords. Good airway.  Procedure: In the ICU, the patient had good respiratory weaning parameters, and cuff air leak.  In the operating room, she was transferred from her bed to the operating table intubated and ventilating spontaneously.  A routine surgical timeout was obtained in the standard fashion.  1 mL of 2% viscous Xylocaine was instilled into the nostrils for topical anesthesia.  Patient received 10 mg IV Decadron Pre-Op.  With the patient awake and moving good tidal volumes,  the flexible laryngoscope was introduced through the left nostril. This was placed in the pharynx. The patient was extubated without difficulty. Upon immediate direct visualization, and cords were mobile and the airway was good. There were minimal secretions.  The flexible scope was removed. Patient was observed for 20 minutes with good volume and oxygen saturation.  The flexible scope was reintroduced on the left nose with the same findings as above. It was again removed.  At this point the procedure was completed. The patient was returned to anesthesia,  and transferred to 2H01 in stable condition.  Dispo:   OR to 2H cardiac unit  Plan:  Elevation, antireflux measures.  Advance diet and activity as allowed per medical conditions.  Tyson Alias MD

## 2016-07-21 NOTE — Progress Notes (Signed)
Pt has a positive cuff leak. 

## 2016-07-21 NOTE — Care Management Important Message (Signed)
Important Message  Patient Details  Name: [name] MRN: [id number] Date of Birth: 1946-11-22   Medicare Important Message Given:  Yes    Loann Quill 07/21/2016, 8:29 AM

## 2016-07-21 NOTE — H&P (View-Only) (Signed)
[name], [name] 70 y.o., female [id number]     Chief Complaint: laryngeal edema  HPI: 70 yo bf with complex medical situation had difficult passage of TEE 7 days ago.  Required conversion to ETT intubation and full general anesthesia.  Yesterday underwent weaning trial and extubation with decompensation and re-intubation yesterday afternoon with some difficulty.  ENT called for assistance with airway management.  She is on Pepcid for known GERD.  Received 3 days of IV Decadron.  PMH: Past Medical History:  Diagnosis Date  . Anemia   . Angina   . Angioedema    2/2 ACE  . Arteriovenous malformation of stomach   . Arthritis   . Asthma   . AVM (arteriovenous malformation) of colon    small intestine; stomach  . Blood transfusion   . CHF (congestive heart failure) ([place])   . Complete heart block ([place])    s/p PPM 1998  . DDD (degenerative disc disease)   . Depression   . Diastolic heart failure   . Fatty liver 07/26/10  . GERD (gastroesophageal reflux disease)   . GI bleed   . History of alcohol abuse Stopped Fall 2012  . History of tobacco use Quit Fall 2012  . Hx of cardiovascular stress test    a. Lexiscan Myoview (10/15):  Small inferolateral and apical defect c/w scar and poss soft tissue attenuation, no ischemia, EF 42%  . Hx of colonic polyp 08/13/10   adenomatous  . Hx of colonoscopy   . Hyperlipidemia   . Hyperlipidemia   . Hypertension   . Hypertrophic cardiomyopathy ([name])    dx by Dr [name] 2009  . Iron deficiency anemia   . Panic attack   . Panic attacks   . Permanent atrial fibrillation ([place])   . Renal failure    baseline creatinine 1.6  . Right arm pain 01/08/2012  . RLS (restless legs syndrome)    Dx 06/2007  . Shortness of breath    sob on exertation  . Sleep apnea     Surg Hx: Past Surgical History:  Procedure Laterality Date  . CARDIAC CATHETERIZATION    . ESOPHAGOGASTRODUODENOSCOPY  12/23/2011   Procedure: ESOPHAGOGASTRODUODENOSCOPY (EGD);   Surgeon: [name], MD;  Location: [facility];  Service: Endoscopy;  Laterality: N/A;  . GIVENS CAPSULE STUDY  12/23/2011   Procedure: GIVENS CAPSULE STUDY;  Surgeon: [name], MD;  Location: [facility];  Service: Endoscopy;  Laterality: N/A;  . PACEMAKER INSERTION  1998   st jude, most recent gen change by [name] 4/12  . TUBAL LIGATION  04/01/2000    FHx:   Family History  Problem Relation Age of Onset  . Hypertension Mother   . Stroke Mother   . Hypertension Father   . Stroke Father   . Hypertension Sister   . Hypertension Brother   . Colon cancer Brother   . Diabetes Sister   . Diabetes Brother   . Cancer Brother   . Heart attack Brother   . Heart attack Father   . Heart disease Mother   . Heart disease Sister   . Alcohol abuse Brother   . Alcohol abuse Father   . Aneurysm Mother   . CVA Mother    SocHx:  reports that she quit smoking about 4 years ago. Her smoking use included Cigarettes. She has a 5.00 pack-year smoking history. She has never used smokeless tobacco. She reports that she drinks alcohol. She reports that she does not use  drugs.  ALLERGIES:  Allergies  Allergen Reactions  . Ace Inhibitors Other (See Comments)    Angioedema  . Propofol Anaphylaxis and Swelling    Facility-Administered Medications Prior to Admission  Medication Dose Route Frequency Provider Last Rate Last Dose  . cyanocobalamin ((VITAMIN B-12)) injection 1,000 mcg  1,000 mcg Intramuscular Q30 days [name], MD   1,000 mcg at 09/16/11 1341   Medications Prior to Admission  Medication Sig Dispense Refill  . acetaminophen (TYLENOL) 325 MG tablet Take 325 mg by mouth daily as needed (pain).    [name] ALPRAZolam (XANAX) 0.5 MG tablet TAKE 1 TABLET BY MOUTH AS NEEDED FOR ANXIETY/ SLEEP 15 tablet 0  . aspirin EC 81 MG tablet Take 81 mg by mouth daily.     [name] CALCIUM-VITAMIN D PO Take 1 tablet by mouth every 14 (fourteen) days.     . carvedilol (COREG) 12.5 MG tablet TAKE 1 TABLET(12.5  MG) BY MOUTH TWICE DAILY 60 tablet 3  . Coenzyme Q-10 200 MG CAPS Take 1 capsule by mouth as directed.     [name] esomeprazole (NEXIUM) 40 MG capsule Take 40 mg by mouth daily.     . ferrous sulfate 325 (65 FE) MG tablet TAKE 1 TABLET BY MOUTH TWICE DAILY WITH MEALS TO KEEP BLOOD COUNT UP 60 tablet 11  . furosemide (LASIX) 20 MG tablet Take 1 tablet by mouth on Tues, Thurs, Sat and Sun and take 2 tablets by mouth on Mon, Wed, Fri 45 tablet 11  . Multiple Vitamin (MULTIVITAMIN) capsule Take 1 capsule by mouth daily.    . predniSONE (DELTASONE) 1 MG tablet Take 5 tablets by mouth daily with breakfast 150 tablet 0  . PROAIR HFA 108 (90 Base) MCG/ACT inhaler INHALE 2 PUFFS INTO THE LUNGS EVERY 6 HOURS AS NEEDED FOR WHEEZING OR SHORTNESS OF BREATH 8.5 g 3  . rosuvastatin (CRESTOR) 20 MG tablet TAKE 1 TABLET BY MOUTH DAILY ON MONDAY, WEDNESDAY, AND FRIDAY 38 tablet 3  . senna (SENOKOT) 8.6 MG tablet Take 1 tablet by mouth daily as needed for constipation.     . sucralfate (CARAFATE) 1 g tablet Take 1 g by mouth 2 (two) times daily.     . nitroGLYCERIN (NITROSTAT) 0.4 MG SL tablet Place 1 tablet (0.4 mg total) under the tongue every 5 (five) minutes as needed for chest pain. 25 tablet 1    Results for orders placed or performed during the hospital encounter of 07/10/16 (from the past 48 hour(s))  Glucose, capillary     Status: Abnormal   Collection Time: 07/14/16  8:38 PM  Result Value Ref Range   Glucose-Capillary 156 (H) 65 - 99 mg/dL   Comment 1 Capillary Specimen   Glucose, capillary     Status: Abnormal   Collection Time: 07/15/16 12:08 AM  Result Value Ref Range   Glucose-Capillary 137 (H) 65 - 99 mg/dL   Comment 1 Capillary Specimen   CBC     Status: Abnormal   Collection Time: 07/15/16  2:20 AM  Result Value Ref Range   WBC 8.1 4.0 - 10.5 K/uL   RBC 3.57 (L) 3.87 - 5.11 MIL/uL   Hemoglobin 9.1 (L) 12.0 - 15.0 g/dL   HCT 31.2 (L) 36.0 - 46.0 %   MCV 87.4 78.0 - 100.0 fL   MCH 25.5 (L)  26.0 - 34.0 pg   MCHC 29.2 (L) 30.0 - 36.0 g/dL   RDW 15.6 (H) 11.5 - 15.5 %   Platelets 215  150 - 400 K/uL  Basic metabolic panel     Status: Abnormal   Collection Time: 07/15/16  2:20 AM  Result Value Ref Range   Sodium 141 135 - 145 mmol/L   Potassium 4.5 3.5 - 5.1 mmol/L   Chloride 103 101 - 111 mmol/L   CO2 32 22 - 32 mmol/L   Glucose, Bld 123 (H) 65 - 99 mg/dL   BUN 44 (H) 6 - 20 mg/dL   Creatinine, Ser 1.06 (H) 0.44 - 1.00 mg/dL   Calcium 10.0 8.9 - 10.3 mg/dL   GFR calc non Af Amer 51 (L) >60 mL/min   GFR calc Af Amer 59 (L) >60 mL/min    Comment: (NOTE) The eGFR has been calculated using the CKD EPI equation. This calculation has not been validated in all clinical situations. eGFR's persistently <60 mL/min signify possible Chronic Kidney Disease.    Anion gap 6 5 - 15  Magnesium     Status: None   Collection Time: 07/15/16  2:20 AM  Result Value Ref Range   Magnesium 2.1 1.7 - 2.4 mg/dL  Phosphorus     Status: None   Collection Time: 07/15/16  2:20 AM  Result Value Ref Range   Phosphorus 3.2 2.5 - 4.6 mg/dL  Glucose, capillary     Status: Abnormal   Collection Time: 07/15/16  4:25 AM  Result Value Ref Range   Glucose-Capillary 130 (H) 65 - 99 mg/dL   Comment 1 Capillary Specimen   Glucose, capillary     Status: Abnormal   Collection Time: 07/15/16  8:10 AM  Result Value Ref Range   Glucose-Capillary 183 (H) 65 - 99 mg/dL   Comment 1 Capillary Specimen   Glucose, capillary     Status: Abnormal   Collection Time: 07/15/16 11:29 AM  Result Value Ref Range   Glucose-Capillary 143 (H) 65 - 99 mg/dL   Comment 1 Capillary Specimen   Glucose, capillary     Status: Abnormal   Collection Time: 07/15/16  4:21 PM  Result Value Ref Range   Glucose-Capillary 223 (H) 65 - 99 mg/dL   Comment 1 Capillary Specimen   I-STAT 3, arterial blood gas (G3+)     Status: Abnormal   Collection Time: 07/15/16  4:25 PM  Result Value Ref Range   pH, Arterial 7.433 7.350 - 7.450    pCO2 arterial 52.0 (H) 35.0 - 45.0 mmHg   pO2, Arterial 138.0 (H) 80.0 - 100.0 mmHg   Bicarbonate 34.8 (H) 20.0 - 24.0 mEq/L   TCO2 36 0 - 100 mmol/L   O2 Saturation 99.0 %   Acid-Base Excess 9.0 (H) 0.0 - 2.0 mmol/L   Patient temperature 98.6 F    Collection site RADIAL, ALLEN'S TEST ACCEPTABLE    Drawn by Operator    Sample type ARTERIAL   Glucose, capillary     Status: Abnormal   Collection Time: 07/15/16  9:03 PM  Result Value Ref Range   Glucose-Capillary 247 (H) 65 - 99 mg/dL   Comment 1 Capillary Specimen   Glucose, capillary     Status: Abnormal   Collection Time: 07/15/16 11:26 PM  Result Value Ref Range   Glucose-Capillary 245 (H) 65 - 99 mg/dL  CBC     Status: Abnormal   Collection Time: 07/16/16  2:11 AM  Result Value Ref Range   WBC 8.0 4.0 - 10.5 K/uL   RBC 3.50 (L) 3.87 - 5.11 MIL/uL   Hemoglobin 9.0 (L) 12.0 - 15.0 g/dL  HCT 30.6 (L) 36.0 - 46.0 %   MCV 87.4 78.0 - 100.0 fL   MCH 25.7 (L) 26.0 - 34.0 pg   MCHC 29.4 (L) 30.0 - 36.0 g/dL   RDW 15.8 (H) 11.5 - 15.5 %   Platelets 196 150 - 400 K/uL  Basic metabolic panel     Status: Abnormal   Collection Time: 07/16/16  2:11 AM  Result Value Ref Range   Sodium 141 135 - 145 mmol/L   Potassium 4.5 3.5 - 5.1 mmol/L   Chloride 104 101 - 111 mmol/L   CO2 28 22 - 32 mmol/L   Glucose, Bld 223 (H) 65 - 99 mg/dL   BUN 41 (H) 6 - 20 mg/dL   Creatinine, Ser 1.12 (H) 0.44 - 1.00 mg/dL   Calcium 9.8 8.9 - 10.3 mg/dL   GFR calc non Af Amer 48 (L) >60 mL/min   GFR calc Af Amer 55 (L) >60 mL/min    Comment: (NOTE) The eGFR has been calculated using the CKD EPI equation. This calculation has not been validated in all clinical situations. eGFR's persistently <60 mL/min signify possible Chronic Kidney Disease.    Anion gap 9 5 - 15  Glucose, capillary     Status: Abnormal   Collection Time: 07/16/16  3:37 AM  Result Value Ref Range   Glucose-Capillary 231 (H) 65 - 99 mg/dL  Glucose, capillary     Status: Abnormal    Collection Time: 07/16/16  7:33 AM  Result Value Ref Range   Glucose-Capillary 249 (H) 65 - 99 mg/dL  Glucose, capillary     Status: Abnormal   Collection Time: 07/16/16 11:53 AM  Result Value Ref Range   Glucose-Capillary 218 (H) 65 - 99 mg/dL  Glucose, capillary     Status: Abnormal   Collection Time: 07/16/16  3:16 PM  Result Value Ref Range   Glucose-Capillary 194 (H) 65 - 99 mg/dL   Ct Head Wo Contrast  Result Date: 07/15/2016 CLINICAL DATA:  Follow-up evaluation. RIGHT arm weakness. History of chronic renal failure, hyperlipidemia, hypertension. EXAM: CT HEAD WITHOUT CONTRAST TECHNIQUE: Contiguous axial images were obtained from the base of the skull through the vertex without intravenous contrast. COMPARISON:  CT HEAD July 13, 2016 in CT HEAD October 05, 2011. FINDINGS: INTRACRANIAL CONTENTS: The ventricles and sulci are normal for age. No intraparenchymal hemorrhage, mass effect nor midline shift. Patchy supratentorial white matter hypodensities are less than expected for patient's age and though non-specific likely represent chronic small vessel ischemic disease. No acute large vascular territory infarcts. No abnormal extra-axial fluid collections. Basal cisterns are patent. Mild calcific atherosclerosis of the carotid siphons. ORBITS: The included ocular globes and orbital contents are non-suspicious. SINUSES: Lobulated maxillary sinusitis without antral expansion. Minimal LEFT mastoid effusion. SKULL/SOFT TISSUES: No skull fracture. No significant soft tissue swelling. Life-support lines in place. Stable 7 mm sclerotic focus LEFT clivus. IMPRESSION: Negative CT HEAD for age. Electronically Signed   By: Elon Alas M.D.   On: 07/15/2016 02:06   Dg Chest Port 1 View  Result Date: 07/16/2016 CLINICAL DATA:  Acute respiratory failure, history of asthma, CHF. EXAM: PORTABLE CHEST 1 VIEW COMPARISON:  Portable chest x-ray of July 15, 2016 FINDINGS: The lungs are well-expanded. The  interstitial markings are mildly prominent bilaterally. The cardiac silhouette is enlarged. The central pulmonary vascularity is engorged. Minimal pleural fluid at the left lung base is suspected. There is calcification in the wall of the aortic arch. The endotracheal tube tip lies  4.9 cm above the carina. The esophagogastric tube tip projects below the inferior margin of the image. The permanent pacemaker electrodes appear to be in stable position where visualized. IMPRESSION: Slight overall increase in conspicuity of the pulmonary interstitium suggests low-grade interstitial edema stable cardiomegaly. Minimal left basilar atelectasis and/or trace of pleural fluid. The support tubes are in reasonable position. Aortic atherosclerosis. Electronically Signed   By: David  [name] M.D.   On: 07/16/2016 07:20   Dg Chest Port 1 View  Result Date: 07/15/2016 CLINICAL DATA:  Evaluate placement of endotracheal tube. EXAM: PORTABLE CHEST 1 VIEW COMPARISON:  07/15/2016 at 5:08 a.m. FINDINGS: Endotracheal tube tip projects 2.6 cm above the carina. Oral/nasogastric tube passes below the diaphragm well into the stomach. Cardiac silhouette is enlarged. There is lung base opacity, left greater than right, most likely atelectasis. No convincing pulmonary edema. No pneumothorax. IMPRESSION: 1. Endotracheal tube tip now projects 2.6 cm above the carina. 2. Mild left greater than right basilar atelectasis is stable the prior exam. No new lung opacities. Electronically Signed   By: Lajean Manes M.D.   On: 07/15/2016 16:53   Dg Chest Port 1 View  Result Date: 07/15/2016 CLINICAL DATA:  Acute respiratory failure. EXAM: PORTABLE CHEST 1 VIEW COMPARISON:  07/14/2016 FINDINGS: Endotracheal tube terminates at the clavicular heads, unchanged. Enteric tube courses into the left upper abdomen with tip not imaged. Cardiac silhouette remains enlarged. Lung volumes are mildly diminished with new mild left basilar opacity which likely reflects  atelectasis. There is minimal right basilar atelectasis which is slightly improved. No sizable pleural effusion or pneumothorax is identified. IMPRESSION: Bibasilar atelectasis. Electronically Signed   By: Logan Bores M.D.   On: 07/15/2016 07:32     Blood pressure (!) 153/77, pulse 75, temperature 99.5 F (37.5 C), temperature source Oral, resp. rate (!) 0, height _0  (1.651 m), weight 80.2 kg (176 lb 12.9 oz), SpO2 100 %.  PHYSICAL EXAM: Overall appearance:  Obese. Intubated. Semi-purposeful movements with manipulation.   Head: NCAT Ears: clear Nose: clear Oral Cavity: OT tube, OG tube, OT suction tube. Bulky tongue. Oral Pharynx/Hypopharynx/Larynx:  See below Neuro: did not evaluate  Neck:  Muscular.    Using the flexible laryngoscope with 5 ml of 2% viscous xylocaine for topical anesthesia, flexible laryngoscope was introduced per RIGHT nose.  Pooled secretions in pharynx.  Could not see any portion of the larynx adequately. Pt tol. Procedure well.    Assessment/Plan Traumatic laryngeal edema, now 7 days out.  Pepcid for reflux is likely inadequate.   Plan:  D/C Pepcid.  Add OG protonix bid ac.  Continue possible weaning trials.  If she does not have excellent response, I would do Direct Laryngoscopy under anesthesia, possible tracheostomy if still significant edema.  It is surprising that blunt trauma has been so slow to resolve.    [name] 08/15/9165, 6:53 PM

## 2016-07-21 NOTE — Anesthesia Postprocedure Evaluation (Signed)
Anesthesia Post Note  Patient: [name]  Procedure(s) Performed: Procedure(s) (LRB): EXTUBATION (ENDOTRACHEAL) IN OR (N/A)  Patient location during evaluation: ICU Anesthesia Type: General Level of consciousness: awake and alert Pain management: pain level controlled Vital Signs Assessment: post-procedure vital signs reviewed and stable Respiratory status: spontaneous breathing, nonlabored ventilation, respiratory function stable and patient connected to nasal cannula oxygen Cardiovascular status: blood pressure returned to baseline and stable Postop Assessment: no signs of nausea or vomiting Anesthetic complications: no    Last Vitals:  Vitals:   07/21/16 0911 07/21/16 1145  BP: 127/85 119/83  Pulse: 75 75  Resp: 16 14  Temp:      Last Pain:  Vitals:   07/21/16 0800  TempSrc: Oral  PainSc:                  [name]

## 2016-07-21 NOTE — Progress Notes (Signed)
Nutrition Follow-up  INTERVENTION:   Ensure Enlive po BID, each supplement provides 350 kcal and 20 grams of protein  NUTRITION DIAGNOSIS:   Inadequate oral intake related to acute illness as evidenced by  (diet just advanced). Ongoing.   GOAL:   Patient will meet greater than or equal to 90% of their needs Progressing.   MONITOR:   PO intake, Supplement acceptance, Diet advancement, I & O's  ASSESSMENT:   70yo female with persistent AFib unable to be anticoagulated r/t previous GI bleeding and known hypertrophic cardiomyopathy admitted after hypotension and resp fx during TEE  8/7 extubated in OR under supervision, diet advanced to full liquids  11 days no bm, on medications  Medications reviewed and include: colace, miralax, senokot  Labs reviewed: BUN 60 CBG's: 101-145   Diet Order:  Diet full liquid Room service appropriate? Yes; Fluid consistency: Thin  Skin:  Reviewed, no issues  Last BM:  unknown  Height:   Ht Readings from Last 1 Encounters:  07/21/16 5\' 5"  (1.651 m)    Weight:   Wt Readings from Last 1 Encounters:  07/21/16 176 lb 9.4 oz (80.1 kg)    Ideal Body Weight:  56.8 kg  BMI:  Body mass index is 29.39 kg/m.  Estimated Nutritional Needs:   Kcal:  1700-1900  Protein:  90-100 grams  Fluid:  > 1.5 L/day  EDUCATION NEEDS:   No education needs identified at this time  [place], [place], [place] Pager [phone] After Hours Pager

## 2016-07-22 ENCOUNTER — Encounter (HOSPITAL_COMMUNITY): Payer: Self-pay | Admitting: Otolaryngology

## 2016-07-22 LAB — BASIC METABOLIC PANEL
Anion gap: 7 (ref 5–15)
BUN: 50 mg/dL — AB (ref 6–20)
CO2: 31 mmol/L (ref 22–32)
CREATININE: 1.13 mg/dL — AB (ref 0.44–1.00)
Calcium: 9.9 mg/dL (ref 8.9–10.3)
Chloride: 101 mmol/L (ref 101–111)
GFR calc Af Amer: 55 mL/min — ABNORMAL LOW (ref >60)
GFR, EST NON AFRICAN AMERICAN: 47 mL/min — AB (ref >60)
GLUCOSE: 114 mg/dL — AB (ref 65–99)
POTASSIUM: 4.1 mmol/L (ref 3.5–5.1)
Sodium: 139 mmol/L (ref 135–145)

## 2016-07-22 LAB — GLUCOSE, CAPILLARY
GLUCOSE-CAPILLARY: 203 mg/dL — AB (ref 65–99)
Glucose-Capillary: 113 mg/dL — ABNORMAL HIGH (ref 65–99)
Glucose-Capillary: 139 mg/dL — ABNORMAL HIGH (ref 65–99)
Glucose-Capillary: 80 mg/dL (ref 65–99)
Glucose-Capillary: 94 mg/dL (ref 65–99)
Glucose-Capillary: 149 mg/dL — ABNORMAL HIGH (ref 65–99)
Glucose-Capillary: 121 mg/dL — ABNORMAL HIGH (ref 65–99)
Glucose-Capillary: 123 mg/dL — ABNORMAL HIGH (ref 65–99)
Glucose-Capillary: 134 mg/dL — ABNORMAL HIGH (ref 65–99)

## 2016-07-22 LAB — MAGNESIUM: Magnesium: 2.2 mg/dL (ref 1.7–2.4)

## 2016-07-22 LAB — PHOSPHORUS: Phosphorus: 4.2 mg/dL (ref 2.5–4.6)

## 2016-07-22 MED ORDER — INSULIN ASPART 100 UNIT/ML ~~LOC~~ SOLN: 0.0000 [IU] | Freq: Every day | SUBCUTANEOUS | Status: DC

## 2016-07-22 MED ORDER — BISACODYL 10 MG RE SUPP
10.0000 mg | Freq: Once | RECTAL | Status: AC
  Administered 2016-07-22: 10 mg via RECTAL
  Filled 2016-07-22: qty 1

## 2016-07-22 MED ORDER — PREDNISONE 5 MG PO TABS
5.0000 mg | ORAL_TABLET | Freq: Every day | ORAL | Status: DC
  Administered 2016-07-22 – 2016-07-26 (×5): 5 mg via ORAL
  Filled 2016-07-22 (×6): qty 1

## 2016-07-22 MED ORDER — MAGNESIUM HYDROXIDE 400 MG/5ML PO SUSP
30.0000 mL | Freq: Every day | ORAL | Status: DC
  Administered 2016-07-22 – 2016-07-26 (×2): 30 mL via ORAL
  Filled 2016-07-22 (×2): qty 30

## 2016-07-22 MED ORDER — FLEET ENEMA 7-19 GM/118ML RE ENEM
1.0000 | ENEMA | Freq: Every day | RECTAL | Status: DC | PRN
  Filled 2016-07-22 (×2): qty 1

## 2016-07-22 MED ORDER — INSULIN ASPART 100 UNIT/ML ~~LOC~~ SOLN
0.0000 [IU] | Freq: Three times a day (TID) | SUBCUTANEOUS | Status: DC
  Administered 2016-07-22 – 2016-07-23 (×2): 2 [IU] via SUBCUTANEOUS
  Administered 2016-07-23: 3 [IU] via SUBCUTANEOUS
  Administered 2016-07-24: 2 [IU] via SUBCUTANEOUS
  Administered 2016-07-24: 5 [IU] via SUBCUTANEOUS
  Administered 2016-07-25 (×2): 3 [IU] via SUBCUTANEOUS
  Administered 2016-07-26 (×2): 2 [IU] via SUBCUTANEOUS

## 2016-07-22 NOTE — Progress Notes (Signed)
PULMONARY / CRITICAL CARE MEDICINE   Name: [name] MRN: [id number] DOB: 02-12-1946    ADMISSION DATE:  07/10/2016 CONSULTATION DATE:  07/10/2016  REFERRING MD:  Allred   CHIEF COMPLAINT:  Respiratory failure   HISTORY OF PRESENT ILLNESS:   70yo female former smoker, polymyalgia rheumatica, HTN, hypertrophic cardiomyopathy, CKD, AFib not candidate for anticoagulation r/t prior GI bleeding who was admitted 7/27 for elective TEE to evaluate L atrial appendage and place watchman device (left atrial appendage closure system for long term stroke prevention).  Pt was anemic on admit and decision was made to not proceed with watchman device (as it would require asa, coumadin for short time afterwards) but TEE was done to eval function and anatomy.  MD had significant difficulty passing the probe, which was eventually passed by anesthesia.  TEE showed ?stunned LV with severe hypokinesis and EF 20-25% and non obstructive HOCM.  However she had significant hypotension and increasing respiratory distress so she was intubated, started on pressors and tx to ICU post procedure.    SUBJECTIVE: extubating speaking, eating   VITAL SIGNS: BP (!) 172/76   Pulse 75   Temp 98.2 F (36.8 C)   Resp 18   Ht 5\' 5"  (1.651 m)   Wt 76.5 kg (168 lb 10.4 oz)   SpO2 100%   BMI 28.07 kg/m   HEMODYNAMICS:   VENTILATOR SETTINGS: Vent Mode: CPAP;PSV FiO2 (%):  [40 %-100 %] 100 % PEEP:  [5 cmH20] 5 cmH20 Pressure Support:  [5 cmH20] 5 cmH20  INTAKE / OUTPUT: I/O last 3 completed shifts: In: 1392.5 [P.O.:650; I.V.:412.5; NG/GT:330] Out: 3280 [Urine:3270; Blood:10]  PHYSICAL EXAMINATION:  General:  Fc, nonfocal examination Neurology: Following commands well, nonfocal. HEENT: no stridor Cardiovascular: s 1 s2 RRR Pulmonary: CTA Abdomen:   Soft. Hypoactive bowel sounds. Nontender. Integument: WNL  LABS:  BMET  Recent Labs Lab 07/20/16 0330 07/21/16 0249 07/22/16 0320  NA 145 143 139   K 5.0 4.9 4.1  CL 106 105 101  CO2 33* 32 31  BUN 70* 60* 50*  CREATININE 1.16* 1.16* 1.13*  GLUCOSE 101* 97 114*    Electrolytes  Recent Labs Lab 07/20/16 0330 07/21/16 0249 07/22/16 0320  CALCIUM 10.0 9.9 9.9  MG 2.4 2.3 2.2  PHOS 4.3 3.1 4.2    CBC  Recent Labs Lab 07/19/16 0330 07/20/16 0330 07/21/16 0249  WBC 8.2 7.3 8.0  HGB 9.7* 9.4* 9.3*  HCT 33.1* 32.5* 32.0*  PLT 248 270 270    Coag's No results for input(s): APTT, INR in the last 168 hours.  Sepsis Markers No results for input(s): LATICACIDVEN, PROCALCITON, O2SATVEN in the last 168 hours.  ABG  Recent Labs Lab 07/15/16 1625 07/19/16 0345  PHART 7.433 7.506*  PCO2ART 52.0* 41.0  PO2ART 138.0* 150*    Liver Enzymes  Recent Labs Lab 07/20/16 0330 07/21/16 0249  ALBUMIN 2.5* 2.4*    Cardiac Enzymes No results for input(s): TROPONINI, PROBNP in the last 168 hours.  Glucose  Recent Labs Lab 07/21/16 0344 07/21/16 0819 07/21/16 1007 07/21/16 1654 07/21/16 1931 07/22/16 0321  GLUCAP 101* 145* 128* 217* 183* 113*    Imaging No results found. STUDIES:  TEE 7/27:  ?stunned LV with severe hypokinesis and EF 20-25% and non obstructive HOCM TTE 7/28: LVEF 55-60%, moderate asymmetric septal hypertrophy, intermittent diastolic dysfunction (AF), no RWMA, PA peak 62  CT soft tissue neck 7/27: pharynx is collapsed around ETT, ? Pharyngeal edema, no hematoma, no gas in soft tissue,  extensive mucosal edema in paranasal sinuses, RUL atelectasis CT Head 7/27: no acute abnormality  MICROBIOLOGY: MRSA PCR 7/27:  Negative Blooc Ctx x2 7/27:  Negative   ANTIBIOTICS: None  SIGNIFICANT EVENTS: 7/27 - TEE. Went into resp distress and hypotension during TEE. Admit 2H 8/01 - Extubated & re-intubated for severe vocal cord edema 8/8- extubated in OR, doing well  LINES/TUBES: R rad aline 7/27 - 7/29>>>8/8 OETT 7.0 7/27 - 8/1; 8/1 (reintubated for vocal cord edema) >> Foley 7/27 >> OGT 8/1  >>8/7 PIV x2  ASSESSMENT / PLAN:  PULMONARY A: Acute Hypoxic Respiratory Failure - Secondary to complications from TEE. Vocal Cord Edema - VERY DIFFICULT INTUBATION H/O COPD - No obstruction on spirometry. Probable OSA/OHS Elevated PA Pressure on Echo  P:   Remains on RA Much appreciate ENT help Reduce roids to off noted Ambulate and check sats  CARDIOVASCULAR A: Stunned LV - S/P TEE 7/27 w/ hypokinesis EF 20-25%.  Chronic Atrial Fib - Paced. Not candidate for anticoagulation r/t previous GI bleeding.  Complete Heart Block  P:  Cardiology following tele  RENAL A: Azotemia - Likely secondary to steroids. H/O CKD Stage III  P:   Chem in am kvo Hold lasix  GASTROINTESTINAL A: Constipation H/O GIB  P:   Protonix VT BID Diet, advance Senna bid VT Colace bid VT mirilax Repeat Dulcolax Suppository, if fails then enemas 100% needed  HEMATOLOGIC A: Anemia - No signs of active bleeding. Hgb stable. DVt prevention P:  Cbc in am  SCDs Lovenox [place] daily-follow clearance  INFECTIOUS A: No acute infection.  P:   Follow fever curve  ENDOCRINE A: Chronic Steroids - Prednisone 5mg  daily for PMR. Hyperglycemia - No h/o DM. Likely secondary to high dose steroids. Improved.   P:   Levemir 8u [place] daily Novolog 4u [place] q4hr Accu-checks q4hr SSI per Moderate Algorithm Home pred to start Diet start  NEUROLOGIC A: Possible Seizures - EEG negative. No MRI w/ pacemaker. Sedation on Ventilator  P:   RASS goal: 0 PT needed  FAMILY  - Updates:  To pt by me - Inter-disciplinary family meet or Palliative Care meeting due by:  8/3.  To triad, to sdu  Lavon Paganini. Titus Mould, MD, [place] Pgr: [facility]

## 2016-07-22 NOTE — Progress Notes (Signed)
Dr. Titus Mould made aware of BP 60s/40s while sitting on bedside commode attempting to stool.  Pt. placed back in bed and BPs rebounded to 80s-100s/60s-80s.  Pt. awake, alert and oriented.  Orders given and initiated.  Will continue to monitor for any further changes.

## 2016-07-23 LAB — CBC WITH DIFFERENTIAL/PLATELET
BASOS ABS: 0 10*3/uL (ref 0.0–0.1)
BASOS PCT: 0 %
EOS ABS: 0.1 10*3/uL (ref 0.0–0.7)
Eosinophils Relative: 1 %
HCT: 34.2 % — ABNORMAL LOW (ref 36.0–46.0)
HEMOGLOBIN: 10.2 g/dL — AB (ref 12.0–15.0)
Lymphocytes Relative: 14 %
Lymphs Abs: 1 10*3/uL (ref 0.7–4.0)
MCH: 26.2 pg (ref 26.0–34.0)
MCHC: 29.8 g/dL — ABNORMAL LOW (ref 30.0–36.0)
MCV: 87.7 fL (ref 78.0–100.0)
MONOS PCT: 9 %
Monocytes Absolute: 0.6 10*3/uL (ref 0.1–1.0)
NEUTROS ABS: 5.4 10*3/uL (ref 1.7–7.7)
NEUTROS PCT: 76 %
Platelets: 269 10*3/uL (ref 150–400)
RBC: 3.9 MIL/uL (ref 3.87–5.11)
RDW: 15.3 % (ref 11.5–15.5)
WBC: 7 10*3/uL (ref 4.0–10.5)

## 2016-07-23 LAB — URINE MICROSCOPIC-ADD ON

## 2016-07-23 LAB — URINALYSIS, ROUTINE W REFLEX MICROSCOPIC
GLUCOSE, UA: NEGATIVE mg/dL
KETONES UR: 15 mg/dL — AB
Nitrite: POSITIVE — AB
PH: 5 (ref 5.0–8.0)
Specific Gravity, Urine: 1.026 (ref 1.005–1.030)

## 2016-07-23 LAB — BASIC METABOLIC PANEL
ANION GAP: 11 (ref 5–15)
BUN: 40 mg/dL — AB (ref 6–20)
CHLORIDE: 100 mmol/L — AB (ref 101–111)
CO2: 27 mmol/L (ref 22–32)
Calcium: 9.5 mg/dL (ref 8.9–10.3)
Creatinine, Ser: 1.16 mg/dL — ABNORMAL HIGH (ref 0.44–1.00)
GFR calc Af Amer: 53 mL/min — ABNORMAL LOW (ref >60)
GFR calc non Af Amer: 46 mL/min — ABNORMAL LOW (ref >60)
Glucose, Bld: 77 mg/dL (ref 65–99)
POTASSIUM: 3.9 mmol/L (ref 3.5–5.1)
SODIUM: 138 mmol/L (ref 135–145)

## 2016-07-23 LAB — GLUCOSE, CAPILLARY
GLUCOSE-CAPILLARY: 93 mg/dL (ref 65–99)
GLUCOSE-CAPILLARY: 90 mg/dL (ref 65–99)
GLUCOSE-CAPILLARY: 176 mg/dL — AB (ref 65–99)
GLUCOSE-CAPILLARY: 146 mg/dL — AB (ref 65–99)
Glucose-Capillary: 118 mg/dL — ABNORMAL HIGH (ref 65–99)

## 2016-07-23 MED ORDER — IPRATROPIUM-ALBUTEROL 0.5-2.5 (3) MG/3ML IN SOLN
3.0000 mL | Freq: Three times a day (TID) | RESPIRATORY_TRACT | Status: DC
  Administered 2016-07-23 – 2016-07-25 (×8): 3 mL via RESPIRATORY_TRACT
  Filled 2016-07-23 (×9): qty 3

## 2016-07-23 NOTE — Progress Notes (Signed)
PROGRESS NOTE    [name]  [id number] DOB: 01-Mar-1946 DOA: 07/10/2016 PCP: Steve Rattler, DO  Brief Narrative: 70yo female former smoker, polymyalgia rheumatica, HTN, hypertrophic cardiomyopathy, CKD, AFib not candidate for anticoagulation r/t prior GI bleeding who was admitted 7/27 for elective TEE to evaluate L atrial appendage and place watchman device (left atrial appendage closure system for long term stroke prevention).  Pt was anemic on admit and decision was made to not proceed with watchman device (as it would require asa, coumadin for short time afterwards) but TEE was done to eval function and anatomy.  MD had significant difficulty passing the probe, which was eventually passed by anesthesia.  TEE showed ?stunned LV with severe hypokinesis and EF 20-25% and non obstructive HOCM.  However she had significant hypotension and increasing respiratory distress so she was intubated, started on pressors and tx to ICU post procedure.   Required Prolonged Vent support, ENT Dr.Wolicki was consulted, she was finally extubated on OR on 8/7 -Transfered to Floor to [facility] today 8/9  Assessment & Plan:  Acute resp failure -following TEE -prolonged Vent requirement had vocal cord edema that limited precluded Extubation earlier -FInally extubated on 8.7 in OR per ENT -wean O2 -s/p ENT eval by Dr.Wolicki  Chronic Atrial Fib - Paced. Not candidate for anticoagulation r/t previous GI bleeding.  -Stunned LV - S/P TEE 7/27 w/ hypokinesis EF 20-25%. Complete Heart Block s/p Pacer Repaet ECHO with EF of 55% now -Cards signed off, FU with Dr.Allred, no plans for watchman device now  Hypotension due to drugs -Bp soft this am, hold norvasc  Delirium -likely ICU delirium -minimize sedatives, re-orientation techniques -CT head unremarkable  CKD 3 -stable  H/o PMR -on chronic prednisone 5mg   Constipation -continue miralax, senna -also received enema in ICU  Hematuria -likley  due to foley trauma in ICU -foley removed prior to floor transfer -check UA, monitor for now  DVT prophylaxis: Lovenox Code Status: FUll Code Family Communication:None at bedside Disposition Plan: SNF in few days when mentation better   Consultants:  PCCM, Cards, ENT  Procedures: TEE  Vent support   Subjective: Some hematuria, no other complaints  Objective: Vitals:   07/23/16 0734 07/23/16 0900 07/23/16 0942 07/23/16 1139  BP: 109/76 (!) 87/56 90/67   Pulse: 75 70 73   Resp: 16 15 19    Temp: 98.1 F (36.7 C)   98.2 F (36.8 C)  TempSrc: Oral   Oral  SpO2: 100% 97% 94%   Weight:      Height:        Intake/Output Summary (Last 24 hours) at 07/23/16 1202 Last data filed at 07/23/16 0914  Gross per 24 hour  Intake              600 ml  Output              501 ml  Net               99 ml   Filed Weights   07/20/16 0727 07/21/16 0500 07/22/16 0500  Weight: 81.4 kg (179 lb 7.3 oz) 80.1 kg (176 lb 9.4 oz) 76.5 kg (168 lb 10.4 oz)    Examination:  General exam: Appears calm and comfortable, alert, awake, oriented to self and place only Respiratory system: Clear to auscultation. Respiratory effort normal. Cardiovascular system: S1 & S2 heard, RRR. No JVD, murmurs, rubs, gallops or clicks. No pedal edema. Gastrointestinal system: Abdomen is nondistended, soft and nontender. No organomegaly or  masses felt. Normal bowel sounds heard. Central nervous system: Alert and oriented. No focal neurological deficits. Extremities: Symmetric 5 x 5 power. Skin: No rashes, lesions or ulcers Psychiatry: Judgement and insight appear normal. Mood & affect appropriate.     Data Reviewed: I have personally reviewed following labs and imaging studies  CBC:  Recent Labs Lab 07/18/16 0214 07/19/16 0330 07/20/16 0330 07/21/16 0249 07/23/16 0404  WBC 10.3 8.2 7.3 8.0 7.0  NEUTROABS  --   --  5.2 6.1 5.4  HGB 10.1* 9.7* 9.4* 9.3* 10.2*  HCT 34.5* 33.1* 32.5* 32.0* 34.2*  MCV  87.3 88.3 87.8 87.9 87.7  PLT 259 248 270 270 [id number]   Basic Metabolic Panel:  Recent Labs Lab 07/17/16 0234  07/19/16 0330 07/20/16 0330 07/21/16 0249 07/22/16 0320 07/23/16 0404  NA 141  < > 144 145 143 139 138  K 4.8  < > 5.0 5.0 4.9 4.1 3.9  CL 103  < > 104 106 105 101 100*  CO2 29  < > 31 33* 32 31 27  GLUCOSE 219*  < > 126* 101* 97 114* 77  BUN 51*  < > 65* 70* 60* 50* 40*  CREATININE 1.04*  < > 1.11* 1.16* 1.16* 1.13* 1.16*  CALCIUM 10.3  < > 10.7* 10.0 9.9 9.9 9.5  MG 2.3  --  2.4 2.4 2.3 2.2  --   PHOS 3.2  --  3.6 4.3 3.1 4.2  --   < > = values in this interval not displayed. GFR: Estimated Creatinine Clearance: 44.8 mL/min (by C-G formula based on SCr of 1.16 mg/dL). Liver Function Tests:  Recent Labs Lab 07/20/16 0330 07/21/16 0249  ALBUMIN 2.5* 2.4*   No results for input(s): LIPASE, AMYLASE in the last 168 hours. No results for input(s): AMMONIA in the last 168 hours. Coagulation Profile: No results for input(s): INR, PROTIME in the last 168 hours. Cardiac Enzymes: No results for input(s): CKTOTAL, CKMB, CKMBINDEX, TROPONINI in the last 168 hours. BNP (last 3 results) No results for input(s): PROBNP in the last 8760 hours. HbA1C: No results for input(s): HGBA1C in the last 72 hours. CBG:  Recent Labs Lab 07/22/16 1736 07/22/16 2125 07/23/16 0153 07/23/16 0733 07/23/16 1115  GLUCAP 123* 134* 93 90 176*   Lipid Profile: No results for input(s): CHOL, HDL, LDLCALC, TRIG, CHOLHDL, LDLDIRECT in the last 72 hours. Thyroid Function Tests: No results for input(s): TSH, T4TOTAL, FREET4, T3FREE, THYROIDAB in the last 72 hours. Anemia Panel: No results for input(s): VITAMINB12, FOLATE, FERRITIN, TIBC, IRON, RETICCTPCT in the last 72 hours. Urine analysis:    Component Value Date/Time   COLORURINE LT. YELLOW 06/28/2012 1417   APPEARANCEUR CLEAR 06/28/2012 1417   LABSPEC 1.015 06/28/2012 1417   PHURINE 6.0 06/28/2012 1417   GLUCOSEU NEGATIVE  06/28/2012 1417   HGBUR NEGATIVE 06/28/2012 1417   HGBUR negative 03/12/2007 0905   BILIRUBINUR NEG 12/26/2014 1139   KETONESUR NEGATIVE 06/28/2012 1417   PROTEINUR 100 12/26/2014 1139   PROTEINUR NEGATIVE 05/03/2012 2217   UROBILINOGEN 1.0 12/26/2014 1139   UROBILINOGEN 0.2 06/28/2012 1417   NITRITE NEG 12/26/2014 1139   NITRITE NEGATIVE 06/28/2012 1417   LEUKOCYTESUR Trace 12/26/2014 1139   Sepsis Labs: @LABRCNTIP (procalcitonin:4,lacticidven:4)  )No results found for this or any previous visit (from the past 240 hour(s)).       Radiology Studies: No results found.      Scheduled Meds: . antiseptic oral rinse  7 mL Mouth Rinse BID  . docusate  100 mg Per Tube BID  . enoxaparin (LOVENOX) injection  40 mg Subcutaneous Q24H  . feeding supplement (ENSURE ENLIVE)  237 mL Oral BID BM  . insulin aspart  0-15 Units Subcutaneous TID WC  . insulin aspart  0-5 Units Subcutaneous QHS  . insulin aspart  4 Units Subcutaneous Q4H  . insulin detemir  8 Units Subcutaneous Daily  . ipratropium-albuterol  3 mL Nebulization TID  . magnesium hydroxide  30 mL Oral Daily  . pantoprazole  40 mg Oral BID AC  . polyethylene glycol  17 g Oral Daily  . predniSONE  5 mg Oral Q breakfast  . sennosides  5 mL Per Tube Q12H   Continuous Infusions: . sodium chloride Stopped (07/17/16 0300)  . feeding supplement (VITAL HIGH PROTEIN) Stopped (07/21/16 0000)     LOS: 13 days    Time spent: 55min    Domenic Polite, MD [facility] Pager [phone]  If 7PM-7AM, please contact night-coverage [email or web address] Password [email or web address] 07/23/2016, 12:02 PM

## 2016-07-23 NOTE — Care Management Important Message (Signed)
Important Message  Patient Details  Name: [name] MRN: [id number] Date of Birth: 1946/08/12   Medicare Important Message Given:  Yes    Loann Quill 07/23/2016, 3:04 PM

## 2016-07-23 NOTE — Clinical Social Work Placement (Signed)
   CLINICAL SOCIAL WORK PLACEMENT  NOTE  Date:  07/23/2016  Patient Details  Name: [name] MRN: [id number] Date of Birth: 1946/03/04  Clinical Social Work is seeking post-discharge placement for this patient at the [place] level of care (*CSW will initial, date and re-position this form in  chart as items are completed):  Yes   Patient/family provided with [place] Work Department's list of facilities offering this level of care within the geographic area requested by the patient (or if unable, by the patient's family).  Yes   Patient/family informed of their freedom to choose among providers that offer the needed level of care, that participate in Medicare, Medicaid or managed care program needed by the patient, have an available bed and are willing to accept the patient.  Yes   Patient/family informed of [facility]'s ownership interest in [facility] and [facility], as well as of the fact that they are under no obligation to receive care at these facilities.  PASRR submitted to EDS on 07/23/16     PASRR number received on 07/23/16     Existing PASRR number confirmed on       FL2 transmitted to all facilities in geographic area requested by pt/family on 07/23/16     FL2 transmitted to all facilities within larger geographic area on       Patient informed that his/her managed care company has contracts with or will negotiate with certain facilities, including the following:            Patient/family informed of bed offers received.  Patient chooses bed at       Physician recommends and patient chooses bed at      Patient to be transferred to   on  .  Patient to be transferred to facility by       Patient family notified on   of transfer.  Name of family member notified:        PHYSICIAN Please prepare priority discharge summary, including medications, Please prepare prescriptions, Please sign FL2     Additional  Comment:    _______________________________________________ Rigoberto Noel, LCSW 07/23/2016, 4:53 PM

## 2016-07-23 NOTE — Care Management Note (Signed)
Case Management Note  Patient Details  Name: [name] MRN: [id number] Date of Birth: [birth year]  Subjective/Objective: Pt presented for Elective TEE for Watchman Device. Pt started going into Respiratory Distress. Pt was intubated/Extubated 07-22-16.                     Action/Plan: PT/OT recommendations for SNF placement. CSW assisting with disposition needs. CM will continue to monitor.   Expected Discharge Date:                  Expected Discharge Plan:  Skilled Nursing Facility  In-House Referral:  Clinical Social Work  Discharge planning Services  CM Consult  Post Acute Care Choice:  NA Choice offered to:  NA  DME Arranged:  N/A DME Agency:  NA  HH Arranged:  NA HH Agency:  NA  Status of Service:  Completed, signed off  If discussed at [place] of Stay Meetings, dates discussed:    Additional Comments:  Bethena Roys, RN 07/23/2016, 3:38 PM

## 2016-07-23 NOTE — Evaluation (Signed)
Physical Therapy Evaluation Patient Details Name: [name] MRN: [id number] DOB: 25-Oct-1946 Today's Date: 07/23/2016   History of Present Illness  Patient is a 70 y/o female with hx of HTN, dyslipidemia, chronic A-fib, chronic diastolic heart failure, complete heart block s/p pacemaker admitted 7/27 for elective TEE to evaluate Lft atrial appendage and place watchman device. Pt with significant hypotension and increasing respiratory distress during procedure so she was intubated 7/27-8/1. Reintubated 8/1-8/8. Found to have vocal cord edema and difficult airway. Concern for possible seizures.  Clinical Impression  Patient presents with generalized weakness, confusion, deconditioning and hypotension s/p above impacting mobility. Tolerated standing and SPT to chair with Mod A of 2 due to bil knee instability. OF NOTE: Supine BP 75/55, 70 bpm Sitting BP 82/56, HR 75 bpm, BP post transfer 87/56, HR 73 bpm, pt asymptomatic. Pt independent PTA. Would benefit from short term SNF to maximize independence and mobility prior to return home with support of family. Will follow acutely.     Follow Up Recommendations SNF;Supervision for mobility/OOB    Equipment Recommendations       Recommendations for Other Services OT consult     Precautions / Restrictions Precautions Precautions: Fall Restrictions Weight Bearing Restrictions: No      Mobility  Bed Mobility Overal bed mobility: Needs Assistance Bed Mobility: Rolling;Sidelying to Sit Rolling: Min guard Sidelying to sit: Min guard;HOB elevated       General bed mobility comments: Performed x2 using rail. Difficulty dual tasking- answering questions and trying to maintain balance.   Transfers Overall transfer level: Needs assistance Equipment used: Rolling walker (2 wheeled) Transfers: Sit to/from [facility] Sit to Stand: Mod assist;+2 physical assistance Stand pivot transfers: Mod assist;+2 physical  assistance       General transfer comment: Assist of 2 to stand locking BLEs into extension to prevent knee buckling. Posterior lean. Stood from EOB x3. LOB onto bed x2. Able to take a few steps tot he chair with assist for balance and RW management.  Ambulation/Gait                Stairs            Wheelchair Mobility    Modified Rankin (Stroke Patients Only)       Balance Overall balance assessment: Needs assistance Sitting-balance support: Feet supported;Single extremity supported Sitting balance-Leahy Scale: Fair Sitting balance - Comments: Able to sit unsupported for 5 minutes. No dizziness.   Standing balance support: During functional activity Standing balance-Leahy Scale: Poor Standing balance comment: Requires BUE support in standing with assist of 1-2 due to bil knee instability/weakness.                              Pertinent Vitals/Pain Pain Assessment: No/denies pain    Home Living Family/patient expects to be discharged to:: Private residence Living Arrangements: Children Available Help at Discharge: Family;Available PRN/intermittently Type of Home: House Home Access: Stairs to enter Entrance Stairs-Rails: Right Entrance Stairs-Number of Steps: 4 or 13 steps Home Layout: One level Home Equipment: Walker - 2 wheels;Bedside commode      Prior Function Level of Independence: Independent         Comments: Drives,cooks, cleans. Pt is home alone during the day.     Hand Dominance        Extremity/Trunk Assessment   Upper Extremity Assessment: Defer to OT evaluation           Lower  Extremity Assessment: Generalized weakness (Grossly ~3/5 throughout.)         Communication   Communication:  (low toned)  Cognition Arousal/Alertness: Lethargic Behavior During Therapy: Flat affect Overall Cognitive Status: Impaired/Different from baseline Area of Impairment: Orientation;Attention;Following commands;Awareness;Problem  solving Orientation Level: Disoriented to;Situation;Time ("not sure why I am here" but able to find calendar on wall to find date with cues.) Current Attention Level: Sustained Memory: Decreased short-term memory Following Commands: Follows multi-step commands with increased time;Follows multi-step commands inconsistently   Awareness: Intellectual Problem Solving: Slow processing;Requires verbal cues;Requires tactile cues General Comments: Pt seems confused. Able to state birthday. Difficult recalling home setup.    General Comments General comments (skin integrity, edema, etc.): Daughter arrived towards end of session.    Exercises        Assessment/Plan    PT Assessment Patient needs continued PT services  PT Diagnosis Difficulty walking;Generalized weakness;Altered mental status   PT Problem List Decreased strength;Decreased mobility;Decreased balance;Cardiopulmonary status limiting activity;Decreased activity tolerance;Decreased cognition  PT Treatment Interventions Gait training;Therapeutic exercise;Functional mobility training;Balance training;Therapeutic activities;Patient/family education   PT Goals (Current goals can be found in the Care Plan section) Acute Rehab PT Goals Patient Stated Goal: none stated PT Goal Formulation: With patient Time For Goal Achievement: 08/06/16 Potential to Achieve Goals: Fair    Frequency Min 2X/week   Barriers to discharge Inaccessible home environment;Decreased caregiver support pt is home alone during the day and has stairs to climb to get into home    Co-evaluation               End of Session Equipment Utilized During Treatment: Gait belt Activity Tolerance: Patient limited by fatigue Patient left: in chair;with call bell/phone within reach;with chair alarm set Nurse Communication: Mobility status         Time: [id number] PT Time Calculation (min) (ACUTE ONLY): 24 min   Charges:   PT Evaluation $PT Eval Moderate  Complexity: 1 Procedure PT Treatments $Therapeutic Activity: 8-22 mins   PT G Codes:        Antara Brecheisen A Idabell Picking 07/23/2016, 11:19 AM Wray Kearns, PT, DPT [phone]

## 2016-07-23 NOTE — Progress Notes (Signed)
eLink Physician-Brief Progress Note Patient Name: [name] DOB: [birth year] MRN: [id number]  RN calling eMD  1. Mild hypoactive delirium - able to follow commands per RN. QTc > 573msec -> so will monitor  2 . Had foley 1:58 AM now mild blood tinged urine -> advised to monitor  3. 5am cbc, bmet ordered  Intervention Category Major Interventions: Other:  Fabrizio Filip 07/23/2016, 1:57 AM

## 2016-07-23 NOTE — Clinical Social Work Note (Signed)
Clinical Social Work Assessment  Patient Details  Name: [name] MRN: [id number] Date of Birth: 04-27-1946  Date of referral:  07/23/16               Reason for consult:  Facility Placement, Discharge Planning                Permission sought to share information with:  Facility Sport and exercise psychologist, Family Supports Permission granted to share information::  Yes, Verbal Permission Granted  Name::     Chief Strategy Officer::  SNFs  Relationship::  daughter  Contact Information:     Housing/Transportation Living arrangements for the past 2 months:  Single Family Home Source of Information:  Patient, Adult Children Patient Interpreter Needed:  None Criminal Activity/Legal Involvement Pertinent to Current Situation/Hospitalization:  No - Comment as needed Significant Relationships:  Adult Children Lives with:  Self Do you feel safe going back to the place where you live?  Yes Need for family participation in patient care:  Yes (Comment)  Care giving concerns:  The patient and daughter do not report any care giving concerns at this time. Both patient and daughter are interested in seeing how the patient progresses over the next day or two before committing fully to SNF placement.   Social Worker assessment / plan:  CSW met with patient and daughter [name] at bedside to complete assessment. The patient states she feels very weak and she reflects on her 13 day hospitalization. CSW explained that MD and RN explained to CSW that the patient may require SNF for rehab at discharge. The patient would like to go home if she can but is not opposed to going to a SNF if this is in her best interest. CSW explained SNF search/placement process and answered the patient and daughter's questions regarding placement. The patient and daughter state that they would have a preference for [facility] [place] if SNF is needed. CSW will follow.   Employment status:  Retired Nurse, adult PT  Recommendations:  Not assessed at this time Information / Referral to community resources:  [place]  Patient/Family's Response to care:  The patient and family appear happy with the care the patient has received. Both the patient and daughter voice appreciation for CSW's assistance with discharge planning.  Patient/Family's Understanding of and Emotional Response to Diagnosis, Current Treatment, and Prognosis:  The patient and family appear to have a good understanding of the patient's reason for admission and hospital course. She appears to have a good understanding of what her post DC needs will likely be. Despite being weak and tired, the patient appears to be coping as well as be expected.   Emotional Assessment Appearance:  Appears stated age Attitude/Demeanor/Rapport:  Other (Patient is pleasant, appropriate, and welcoming of CSW.) Affect (typically observed):  Accepting, Calm, Appropriate, Pleasant Orientation:  Oriented to Self, Oriented to Place, Oriented to  Time, Oriented to Situation Alcohol / Substance use:  Not Applicable Psych involvement (Current and /or in the community):  No (Comment)  Discharge Needs  Concerns to be addressed:  Discharge Planning Concerns, Care Coordination Readmission within the last 30 days:  No Current discharge risk:  Chronically ill, Physical Impairment Barriers to Discharge:  Continued Medical Work up   Fredderick Phenix B, LCSW 07/23/2016, 11:35 AM

## 2016-07-23 NOTE — NC FL2 (Signed)
MEDICAID FL2 LEVEL OF CARE SCREENING TOOL     IDENTIFICATION  Patient Name: [name] Birthdate: 11-25-1946 Sex: female Admission Date (Current Location): 07/10/2016  [facility] and [place] Number:  Herbalist and Address:  [facility], [place] [address], [place], [place] [address]      Provider Number: [id number]  Attending Physician Name and Address:  Domenic Polite, MD  Relative Name and Phone Number:       Current Level of Care: Hospital Recommended Level of Care: [place] Prior Approval Number:    Date Approved/Denied: 07/23/16 PASRR Number: [id number]  Discharge Plan: Home    Current Diagnoses: Patient Active Problem List   Diagnosis Date Noted  . Delirium   . Esophageal abnormality   . Endotracheal tube present   . Somnolence   . Respiratory failure ([place]) 07/11/2016  . Persistent atrial fibrillation ([place]) 07/10/2016  . Acute respiratory failure ([place])   . Hypotension due to drugs   . Demand ischemia ([place])   . Chest discomfort 02/05/2016  . COPD exacerbation ([place])   . Palpitations   . Chest pain 11/25/2015  . Dyspnea on exertion   . Polymyalgia rheumatica ([place]) 04/10/2015  . Low back pain 03/28/2015  . Hypertrophic cardiomyopathy ([place]) 02/15/2015  . Eczema 09/18/2014  . Environmental allergies 09/18/2014  . Pre-diabetes 11/26/2013  . COPD, moderate ([place]) 11/01/2013  . Chronic renal failure, stage 3 (moderate) 08/28/2012  . Preventative health care 08/18/2012  . Complete heart block ([place]) 04/03/2011  . Anemia 02/17/2011  . AVM (arteriovenous malformation) 02/13/2011  . PACEMAKER-[place] 11/28/2010  . CHRONIC KIDNEY DISEASE STAGE III (MODERATE) 09/24/2010  . (HFpEF) heart failure with preserved ejection fraction ([place]) 08/30/2010  . ARTHRITIS 07/24/2010  . ANXIETY 07/23/2010  . Chronic atrial fibrillation ([place]) 07/16/2010  . HLD (hyperlipidemia) 02/11/2007  . Hypertension 02/11/2007   . APNEA, SLEEP 02/11/2007    Orientation RESPIRATION BLADDER Height & Weight     Self  O2 (1L) Continent Weight: 76.5 kg (168 lb 10.4 oz) Height:  5\' 5"  (165.1 cm)  BEHAVIORAL SYMPTOMS/MOOD NEUROLOGICAL BOWEL NUTRITION STATUS   (NONE)  (NONE) Continent Diet (Carb Modified)  AMBULATORY STATUS COMMUNICATION OF NEEDS Skin   Extensive Assist Verbally Surgical wounds                       Personal Care Assistance Level of Assistance  Bathing, Feeding, Dressing Bathing Assistance: Limited assistance Feeding assistance: Independent Dressing Assistance: Limited assistance     Functional Limitations Info  Sight, Hearing, Speech Sight Info: Adequate Hearing Info: Adequate Speech Info: Adequate    SPECIAL CARE FACTORS FREQUENCY  PT (By licensed PT), OT (By licensed OT)     PT Frequency: 5/week OT Frequency: 5/week            Contractures Contractures Info: Not present    Additional Factors Info  Code Status, Allergies, Insulin Sliding Scale Code Status Info: Full Allergies Info: Ace Inhibitors, propofol   Insulin Sliding Scale Info: 6/day       Current Medications (07/23/2016):  This is the current hospital active medication list Current Facility-Administered Medications  Medication Dose Route Frequency Provider Last Rate Last Dose  . 0.9 %  sodium chloride infusion   Intravenous Continuous Donita Brooks, NP   Stopped at 07/17/16 0300  . albuterol (PROVENTIL) (2.5 MG/3ML) 0.083% nebulizer solution 2.5 mg  2.5 mg Nebulization Q3H PRN Marijean Heath, NP      .  antiseptic oral rinse (CPC / CETYLPYRIDINIUM CHLORIDE 0.05%) solution 7 mL  7 mL Mouth Rinse BID Jose Shirl Harris, MD   7 mL at 07/21/16 2200  . bisacodyl (DULCOLAX) EC tablet 5 mg  5 mg Oral Daily PRN Praveen Mannam, MD      . docusate (COLACE) 50 MG/5ML liquid 100 mg  100 mg Per Tube BID Javier Glazier, MD   100 mg at 07/22/16 0929  . enoxaparin (LOVENOX) injection 40 mg  40 mg Subcutaneous Q24H  Praveen Mannam, MD   40 mg at 07/23/16 1034  . feeding supplement (ENSURE ENLIVE) (ENSURE ENLIVE) liquid 237 mL  237 mL Oral BID BM Raylene Miyamoto, MD   237 mL at 07/23/16 1034  . feeding supplement (VITAL HIGH PROTEIN) liquid 1,000 mL  1,000 mL Per Tube Continuous Marshell Garfinkel, MD   Stopped at 07/21/16 0000  . hydrALAZINE (APRESOLINE) injection 10 mg  10 mg Intravenous Q2H PRN Jolaine Artist, MD   10 mg at 07/18/16 [id number]  . ibuprofen (ADVIL,MOTRIN) 100 MG/5ML suspension 400 mg  400 mg Oral [id number] PRN [name], MD      . insulin aspart (novoLOG) injection 0-15 Units  0-15 Units Subcutaneous TID WC [place], MD   2 Units at 07/22/16 1310  . insulin aspart (novoLOG) injection 0-5 Units  0-5 Units Subcutaneous QHS Jose Angelo A de Larkin Ina, MD      . insulin aspart (novoLOG) injection 4 Units  4 Units Subcutaneous Q4H Marshell Garfinkel, MD   4 Units at 07/21/16 1938  . insulin detemir (LEVEMIR) injection 8 Units  8 Units Subcutaneous Daily Praveen Mannam, MD   8 Units at 07/22/16 1310  . ipratropium-albuterol (DUONEB) 0.5-2.5 (3) MG/3ML nebulizer solution 3 mL  3 mL Nebulization TID Jose Shirl Harris, MD   3 mL at 07/23/16 0657  . magnesium hydroxide (MILK OF MAGNESIA) suspension 15 mL  15 mL Oral Daily PRN Jagual, MD   15 mL at 07/15/16 1758  . magnesium hydroxide (MILK OF MAGNESIA) suspension 30 mL  30 mL Oral Daily Jose Shirl Harris, MD   30 mL at 07/22/16 0929  . ondansetron (ZOFRAN) injection 4 mg  4 mg Intravenous Q6H PRN Amber Sena Slate, NP      . pantoprazole (PROTONIX) EC tablet 40 mg  40 mg Oral BID AC Kimberly B Hammons, RPH   40 mg at 07/23/16 1032  . polyethylene glycol (MIRALAX / GLYCOLAX) packet 17 g  17 g Oral Daily Dargan, MD   17 g at 07/22/16 [id number]  . predniSONE (DELTASONE) tablet 5 mg  5 mg Oral Q breakfast Jose Shirl Harris, MD   5 mg at 07/23/16 1033  . sennosides (SENOKOT) 8.8 MG/5ML syrup 5 mL  5 mL Per Tube Q12H Javier Glazier, MD   5 mL at 07/22/16 0929  . sodium phosphate (FLEET) 7-19 GM/118ML enema 1 enema  1 enema Rectal Daily PRN Jose Shirl Harris, MD         Discharge Medications: Please see discharge summary for a list of discharge medications.  Relevant Imaging Results:  Relevant Lab Results:   Additional Information SSN: [id number]  Rigoberto Noel, LCSW

## 2016-07-24 LAB — BASIC METABOLIC PANEL
Anion gap: 8 (ref 5–15)
BUN: 41 mg/dL — AB (ref 6–20)
CALCIUM: 8.9 mg/dL (ref 8.9–10.3)
CO2: 25 mmol/L (ref 22–32)
CREATININE: 1.27 mg/dL — AB (ref 0.44–1.00)
Chloride: 101 mmol/L (ref 101–111)
GFR calc Af Amer: 48 mL/min — ABNORMAL LOW (ref >60)
GFR calc non Af Amer: 41 mL/min — ABNORMAL LOW (ref >60)
GLUCOSE: 98 mg/dL (ref 65–99)
Potassium: 4 mmol/L (ref 3.5–5.1)
SODIUM: 134 mmol/L — AB (ref 135–145)

## 2016-07-24 LAB — GLUCOSE, CAPILLARY
GLUCOSE-CAPILLARY: 118 mg/dL — AB (ref 65–99)
GLUCOSE-CAPILLARY: 144 mg/dL — AB (ref 65–99)
GLUCOSE-CAPILLARY: 238 mg/dL — AB (ref 65–99)
GLUCOSE-CAPILLARY: 93 mg/dL (ref 65–99)

## 2016-07-24 LAB — CBC
HCT: 32.8 % — ABNORMAL LOW (ref 36.0–46.0)
Hemoglobin: 9.8 g/dL — ABNORMAL LOW (ref 12.0–15.0)
MCH: 25.5 pg — AB (ref 26.0–34.0)
MCHC: 29.9 g/dL — AB (ref 30.0–36.0)
MCV: 85.4 fL (ref 78.0–100.0)
PLATELETS: 288 10*3/uL (ref 150–400)
RBC: 3.84 MIL/uL — AB (ref 3.87–5.11)
RDW: 15 % (ref 11.5–15.5)
WBC: 4.9 10*3/uL (ref 4.0–10.5)

## 2016-07-24 MED ORDER — DEXTROSE 5 % IV SOLN
1.0000 g | INTRAVENOUS | Status: DC
  Administered 2016-07-24 – 2016-07-26 (×3): 1 g via INTRAVENOUS
  Filled 2016-07-24 (×5): qty 10

## 2016-07-24 NOTE — Consult Note (Signed)
   [facility] CM Inpatient Consult   07/24/2016  [name] Apr 28, 1946 [id number]   Patient screened for potential [facility] Care Management services. Chart reviewed. Noted discharge plan is for  SNF.  There are no identifiable [facility] Care Management needs at this time. If patient's post hospital needs change, please place a [facility] Care Management consult. For questions please contact:  Marthenia Rolling, [place], RN,BSN [facility] Liaison [phone]

## 2016-07-24 NOTE — Progress Notes (Signed)
Physical Therapy Treatment Patient Details Name: [name] MRN: [id number] DOB: 13-Sep-1946 Today's Date: 07/24/2016    History of Present Illness Patient is a 70 y/o female with hx of HTN, dyslipidemia, chronic A-fib, chronic diastolic heart failure, complete heart block s/p pacemaker admitted 7/27 for elective TEE to evaluate Lft atrial appendage and place watchman device. Pt with significant hypotension and increasing respiratory distress during procedure so she was intubated 7/27-8/1. Reintubated 8/1-8/8. Found to have vocal cord edema and difficult airway. Concern for possible seizures.    PT Comments    Pt is fatigued from OT evaluation and being up in chair per her report all morning.  Her plan is SNF with PT in agreement as pt is not moving well even at bed level to reposition herself and to move extremities.  Will continue to follow acutely to increase LE strength and prepare for gait to decrease need for longer SNF stay.  Follow Up Recommendations  SNF     Equipment Recommendations  None recommended by PT (await SNF disposition)    Recommendations for Other Services OT consult     Precautions / Restrictions Precautions Precautions: Fall Restrictions Weight Bearing Restrictions: No    Mobility  Bed Mobility               General bed mobility comments: declined  Transfers   Equipment used: Rolling walker (2 wheeled)   Sit to Stand: Mod assist         General transfer comment: declined  Ambulation/Gait                 Stairs            Wheelchair Mobility    Modified Rankin (Stroke Patients Only)       Balance                                    Cognition Arousal/Alertness: Awake/alert Behavior During Therapy: Flat affect Overall Cognitive Status: Impaired/Different from baseline Area of Impairment: Following commands;Awareness;Problem solving;Attention Orientation Level: Situation Current Attention  Level: Selective Memory: Decreased short-term memory Following Commands: Follows one step commands inconsistently   Awareness: Intellectual Problem Solving: Slow processing;Decreased initiation;Requires verbal cues General Comments: Pt is slow to respond to cues for ther exercise, seems not to remember to continue with cue for set number    Exercises General Exercises - Lower Extremity Ankle Circles/Pumps: AROM;5 reps;Supine Quad Sets: AROM;Both;15 reps Gluteal Sets: AROM;Both;15 reps Heel Slides: Strengthening;Both;20 reps Hip ABduction/ADduction: Strengthening;Both;Other reps (comment) (30) Hip Flexion/Marching: AROM;Both;10 reps    General Comments        Pertinent Vitals/Pain      Home Living Family/patient expects to be discharged to:: [place]: [facility] Rile - 2 wheels;Bedside commode      Prior Function Level of Independence: Independent      Comments: Drives,cooks, cleans. Pt is home alone during the day.   PT Goals (current goals can now be found in the care plan section) Acute Rehab PT Goals Patient Stated Goal: none stated Progress towards PT goals: Not progressing toward goals - comment (up but not with rehab)    Frequency  Min 2X/week    PT Plan Current plan remains appropriate    Co-evaluation             End of Session   Activity  Tolerance: Patient limited by fatigue Patient left: in bed;with call bell/phone within reach;with bed alarm set     Time: 1015-1040 PT Time Calculation (min) (ACUTE ONLY): 25 min  Charges:  $Therapeutic Exercise: 23-37 mins                    G Codes:      Ramond Dial 06-Aug-2016, 10:42 AM    Mee Hives, PT MS Acute Rehab Dept. Number: [place] and [place]

## 2016-07-24 NOTE — Progress Notes (Signed)
PROGRESS NOTE    [name]  [id number] DOB: Nov 12, 1946 DOA: 07/10/2016 PCP: Steve Rattler, DO  Brief Narrative: 70yo female former smoker, polymyalgia rheumatica, HTN, hypertrophic cardiomyopathy, CKD, AFib not candidate for anticoagulation r/t prior GI bleeding who was admitted 7/27 for elective TEE to evaluate L atrial appendage and place watchman device (left atrial appendage closure system for long term stroke prevention).  Pt was anemic on admit and decision was made to not proceed with watchman device (as it would require asa, coumadin for short time afterwards) but TEE was done to eval function and anatomy.  MD had significant difficulty passing the probe, which was eventually passed by anesthesia.  TEE showed ?stunned LV with severe hypokinesis and EF 20-25% and non obstructive HOCM.  However she had significant hypotension and increasing respiratory distress so she was intubated, started on pressors and tx to ICU post procedure.   Required Prolonged Vent support, ENT Dr.Wolicki was consulted, she was finally extubated on OR on 8/7 -Transfered to Floor to [facility] today 8/9  Assessment & Plan:  Acute resp failure -following TEE -prolonged Vent requirement had vocal cord edema that limited precluded Extubation earlier -FInally extubated on 8.7 in OR per ENT -wean O2 -s/p ENT eval by Dr.Wolicki  Chronic Atrial Fib - Paced. Not candidate for anticoagulation r/t previous GI bleeding.  -Stunned LV - S/P TEE 7/27 w/ hypokinesis EF 20-25%. Complete Heart Block s/p Pacer Repaet ECHO with EF of 55% now -Cards signed off, FU with Dr.Allred, no plans for watchman device now  Hypotension due to drugs -Bp soft this am, hold norvasc  Delirium -likely ICU delirium -minimize sedatives, re-orientation techniques -CT head unremarkable -mentation improving  CKD 3 -stable  H/o PMR -on chronic prednisone 5mg   Constipation -continue miralax, senna -also received enema in  ICU  Hematuria -likley due to UTI +/- foley trauma while in ICU -foley removed prior to floor transfer -UA with large bacteria, LE and WBC in addition to RBCs will add rocephin and check urine Cx, also hold lovenox  DVT prophylaxis: held Lovenox today, add SCDs Code Status: FUll Code Family Communication:None at bedside Disposition Plan: SNF in few days when mentation better and hematuria clears   Consultants:  PCCM, Cards, ENT  Procedures: TEE  Vent support   Subjective: Some hematuria, no other complaints  Objective: Vitals:   07/23/16 2358 07/24/16 0448 07/24/16 0721 07/24/16 0931  BP: 93/68 95/72 (!) 89/58   Pulse: 75 75 75 75  Resp: 20 20 (!) 21 18  Temp: 98.2 F (36.8 C) 98.1 F (36.7 C) 98.8 F (37.1 C)   TempSrc: Oral Oral Oral   SpO2: 98% 97% 96% 100%  Weight:  73.2 kg (161 lb 4.8 oz)    Height:        Intake/Output Summary (Last 24 hours) at 07/24/16 1219 Last data filed at 07/24/16 0250  Gross per 24 hour  Intake              600 ml  Output              500 ml  Net              100 ml   Filed Weights   07/21/16 0500 07/22/16 0500 07/24/16 0448  Weight: 80.1 kg (176 lb 9.4 oz) 76.5 kg (168 lb 10.4 oz) 73.2 kg (161 lb 4.8 oz)    Examination:  General exam: Appears calm and comfortable, alert, awake, oriented to self and place, more  lucid today Respiratory system: Clear to auscultation. Respiratory effort normal. Cardiovascular system: S1 & S2 heard, RRR. No JVD, murmurs, rubs, gallops or clicks. No pedal edema. Gastrointestinal system: Abdomen is nondistended, soft and nontender. No organomegaly or masses felt. Normal bowel sounds heard. Central nervous system: Alert and oriented. No focal neurological deficits. Extremities: Symmetric 5 x 5 power. Skin: No rashes, lesions or ulcers Psychiatry: Judgement and insight appear normal. Mood & affect appropriate.     Data Reviewed: I have personally reviewed following labs and imaging  studies  CBC:  Recent Labs Lab 07/19/16 0330 07/20/16 0330 07/21/16 0249 07/23/16 0404 07/24/16 0330  WBC 8.2 7.3 8.0 7.0 4.9  NEUTROABS  --  5.2 6.1 5.4  --   HGB 9.7* 9.4* 9.3* 10.2* 9.8*  HCT 33.1* 32.5* 32.0* 34.2* 32.8*  MCV 88.3 87.8 87.9 87.7 85.4  PLT 248 270 270 269 [id number]   Basic Metabolic Panel:  Recent Labs Lab 07/19/16 0330 07/20/16 0330 07/21/16 0249 07/22/16 0320 07/23/16 0404 07/24/16 0340  NA 144 145 143 139 138 134*  K 5.0 5.0 4.9 4.1 3.9 4.0  CL 104 106 105 101 100* 101  CO2 31 33* 32 31 27 25   GLUCOSE 126* 101* 97 114* 77 98  BUN 65* 70* 60* 50* 40* 41*  CREATININE 1.11* 1.16* 1.16* 1.13* 1.16* 1.27*  CALCIUM 10.7* 10.0 9.9 9.9 9.5 8.9  MG 2.4 2.4 2.3 2.2  --   --   PHOS 3.6 4.3 3.1 4.2  --   --    GFR: Estimated Creatinine Clearance: 40.1 mL/min (by C-G formula based on SCr of 1.27 mg/dL). Liver Function Tests:  Recent Labs Lab 07/20/16 0330 07/21/16 0249  ALBUMIN 2.5* 2.4*   No results for input(s): LIPASE, AMYLASE in the last 168 hours. No results for input(s): AMMONIA in the last 168 hours. Coagulation Profile: No results for input(s): INR, PROTIME in the last 168 hours. Cardiac Enzymes: No results for input(s): CKTOTAL, CKMB, CKMBINDEX, TROPONINI in the last 168 hours. BNP (last 3 results) No results for input(s): PROBNP in the last 8760 hours. HbA1C: No results for input(s): HGBA1C in the last 72 hours. CBG:  Recent Labs Lab 07/23/16 1115 07/23/16 1616 07/23/16 2148 07/24/16 0719 07/24/16 1200  GLUCAP 176* 146* 118* 118* 144*   Lipid Profile: No results for input(s): CHOL, HDL, LDLCALC, TRIG, CHOLHDL, LDLDIRECT in the last 72 hours. Thyroid Function Tests: No results for input(s): TSH, T4TOTAL, FREET4, T3FREE, THYROIDAB in the last 72 hours. Anemia Panel: No results for input(s): VITAMINB12, FOLATE, FERRITIN, TIBC, IRON, RETICCTPCT in the last 72 hours. Urine analysis:    Component Value Date/Time   COLORURINE BROWN  (A) 07/23/2016 1253   APPEARANCEUR TURBID (A) 07/23/2016 1253   LABSPEC 1.026 07/23/2016 1253   PHURINE 5.0 07/23/2016 1253   GLUCOSEU NEGATIVE 07/23/2016 1253   GLUCOSEU NEGATIVE 06/28/2012 1417   HGBUR LARGE (A) 07/23/2016 1253   HGBUR negative 03/12/2007 0905   BILIRUBINUR LARGE (A) 07/23/2016 1253   BILIRUBINUR NEG 12/26/2014 1139   KETONESUR 15 (A) 07/23/2016 1253   PROTEINUR >300 (A) 07/23/2016 1253   UROBILINOGEN 1.0 12/26/2014 1139   UROBILINOGEN 0.2 06/28/2012 1417   NITRITE POSITIVE (A) 07/23/2016 1253   LEUKOCYTESUR MODERATE (A) 07/23/2016 1253   Sepsis Labs: @LABRCNTIP (procalcitonin:4,lacticidven:4)  )No results found for this or any previous visit (from the past 240 hour(s)).       Radiology Studies: No results found.      Scheduled Meds: . antiseptic oral  rinse  7 mL Mouth Rinse BID  . cefTRIAXone (ROCEPHIN)  IV  1 g Intravenous Q24H  . docusate  100 mg Per Tube BID  . feeding supplement (ENSURE ENLIVE)  237 mL Oral BID BM  . insulin aspart  0-15 Units Subcutaneous TID WC  . insulin aspart  0-5 Units Subcutaneous QHS  . ipratropium-albuterol  3 mL Nebulization TID  . magnesium hydroxide  30 mL Oral Daily  . pantoprazole  40 mg Oral BID AC  . polyethylene glycol  17 g Oral Daily  . predniSONE  5 mg Oral Q breakfast  . sennosides  5 mL Per Tube Q12H   Continuous Infusions: . sodium chloride Stopped (07/17/16 0300)  . feeding supplement (VITAL HIGH PROTEIN) Stopped (07/21/16 0000)     LOS: 14 days    Time spent: 53min    Domenic Polite, MD [facility] Pager [phone]  If 7PM-7AM, please contact night-coverage [email or web address] Password [email or web address] 07/24/2016, 12:19 PM

## 2016-07-24 NOTE — Evaluation (Signed)
Occupational Therapy Evaluation Patient Details Name: [name] MRN: [id number] DOB: December 11, 1946 Today's Date: 07/24/2016    History of Present Illness Patient is a 70 y/o female with hx of HTN, dyslipidemia, chronic A-fib, chronic diastolic heart failure, complete heart block s/p pacemaker admitted 7/27 for elective TEE to evaluate Lft atrial appendage and place watchman device. Pt with significant hypotension and increasing respiratory distress during procedure so she was intubated 7/27-8/1. Reintubated 8/1-8/8. Found to have vocal cord edema and difficult airway. Concern for possible seizures.   Clinical Impression   Pt was admitted for the above.  She was independent with adls prior to admission and she currently needs up to mod A for LB.  She will benefit from continued OT. Goals in acute are for min A overall    Follow Up Recommendations  SNF    Equipment Recommendations  None recommended by OT    Recommendations for Other Services       Precautions / Restrictions Precautions Precautions: Fall Restrictions Weight Bearing Restrictions: No      Mobility Bed Mobility               General bed mobility comments: oob  Transfers   Equipment used: Rolling walker (2 wheeled)   Sit to Stand: Mod assist         General transfer comment: multimodal cues for hand placement; LLE started to buckle during standing for adls    Balance                                            ADL Overall ADL's : Needs assistance/impaired     Grooming: Oral care;Set up;Sitting   Upper Body Bathing: Min guard;Sitting   Lower Body Bathing: Moderate assistance;Sit to/from stand   Upper Body Dressing : Minimal assistance;Sitting (lines)   Lower Body Dressing: Moderate assistance;Sit to/from stand                 General ADL Comments: encouraged rest breaks and educated on energy conservation.  Performed ADL from chair, sit to stand.  Pt  needed multimodal cues to not wash over leads.  Of note, pt's BP low but pt asymptomatic (95/58)     Vision     Perception     Praxis      Pertinent Vitals/Pain       Hand Dominance     Extremity/Trunk Assessment Upper Extremity Assessment Upper Extremity Assessment: Generalized weakness           Communication Communication Communication: No difficulties   Cognition Arousal/Alertness: Awake/alert Behavior During Therapy: Flat affect       Current Attention Level: Sustained   Following Commands: Follows one step commands with increased time (multimodal cues)       General Comments: needed cues not to wash over leads on chest; multimodal cues for hand placement for sit to stand   General Comments       Exercises       Shoulder Instructions      Home Living Family/patient expects to be discharged to:: Skilled nursing facility                             Home Equipment: [facility] Rile - 2 wheels;Bedside commode          Prior Functioning/Environment Level of Independence: Independent  Comments: Drives,cooks, cleans. Pt is home alone during the day.    OT Diagnosis: Generalized weakness   OT Problem List: Decreased strength;Decreased activity tolerance;Impaired balance (sitting and/or standing);Decreased cognition;Decreased knowledge of use of DME or AE;Cardiopulmonary status limiting activity   OT Treatment/Interventions: Self-care/ADL training;Energy conservation;DME and/or AE instruction;Therapeutic activities;Cognitive remediation/compensation;Patient/family education;Balance training    OT Goals(Current goals can be found in the care plan section) Acute Rehab OT Goals Patient Stated Goal: get stronger OT Goal Formulation: With patient Time For Goal Achievement: 08/07/16 Potential to Achieve Goals: Good ADL Goals Pt Will Transfer to Toilet: with min assist;bedside commode;stand pivot transfer Pt Will Perform Toileting - Clothing  Manipulation and hygiene: with min assist;sit to/from stand Additional ADL Goal #1: pt will complete UB adls with set up, seated Additional ADL Goal #2: pt will complete LB adls with min A sit to stand Additional ADL Goal #3: pt will initiate at least one rest break for energy conservation  OT Frequency: Min 2X/week   Barriers to D/C:            Co-evaluation              End of Session    Activity Tolerance: Patient tolerated treatment well Patient left: in chair;with call bell/phone within reach   Time: [id number] OT Time Calculation (min): 28 min Charges:  OT General Charges $OT Visit: 1 Procedure OT Evaluation $OT Eval Moderate Complexity: 1 Procedure OT Treatments $Self Care/Home Management : 8-22 mins G-Codes:    Gethsemane Fischler Aug 16, 2016, 9:30 AM  Lesle Chris, OTR/L [phone] 08-16-16

## 2016-07-25 ENCOUNTER — Inpatient Hospital Stay (HOSPITAL_COMMUNITY): Payer: Medicare Other

## 2016-07-25 LAB — BASIC METABOLIC PANEL
Anion gap: 11 (ref 5–15)
BUN: 34 mg/dL — ABNORMAL HIGH (ref 6–20)
CALCIUM: 9.1 mg/dL (ref 8.9–10.3)
CO2: 25 mmol/L (ref 22–32)
CREATININE: 1.23 mg/dL — AB (ref 0.44–1.00)
Chloride: 101 mmol/L (ref 101–111)
GFR, EST AFRICAN AMERICAN: 50 mL/min — AB (ref >60)
GFR, EST NON AFRICAN AMERICAN: 43 mL/min — AB (ref >60)
GLUCOSE: 106 mg/dL — AB (ref 65–99)
Potassium: 3.7 mmol/L (ref 3.5–5.1)
Sodium: 137 mmol/L (ref 135–145)

## 2016-07-25 LAB — CBC
HCT: 31.3 % — ABNORMAL LOW (ref 36.0–46.0)
Hemoglobin: 9.5 g/dL — ABNORMAL LOW (ref 12.0–15.0)
MCH: 25.8 pg — AB (ref 26.0–34.0)
MCHC: 30.4 g/dL (ref 30.0–36.0)
MCV: 85.1 fL (ref 78.0–100.0)
PLATELETS: 283 10*3/uL (ref 150–400)
RBC: 3.68 MIL/uL — ABNORMAL LOW (ref 3.87–5.11)
RDW: 15.1 % (ref 11.5–15.5)
WBC: 5.1 10*3/uL (ref 4.0–10.5)

## 2016-07-25 LAB — GLUCOSE, CAPILLARY
GLUCOSE-CAPILLARY: 120 mg/dL — AB (ref 65–99)
Glucose-Capillary: 152 mg/dL — ABNORMAL HIGH (ref 65–99)
Glucose-Capillary: 155 mg/dL — ABNORMAL HIGH (ref 65–99)
Glucose-Capillary: 132 mg/dL — ABNORMAL HIGH (ref 65–99)

## 2016-07-25 MED ORDER — ACETAMINOPHEN 325 MG PO TABS
650.0000 mg | ORAL_TABLET | Freq: Four times a day (QID) | ORAL | Status: DC | PRN
Start: 2016-07-25 — End: 2016-07-26
  Administered 2016-07-25: 650 mg via ORAL
  Filled 2016-07-25: qty 2

## 2016-07-25 MED ORDER — SENNA 8.6 MG PO TABS
1.0000 | ORAL_TABLET | Freq: Two times a day (BID) | ORAL | Status: DC
  Administered 2016-07-25 – 2016-07-26 (×2): 8.6 mg via ORAL
  Filled 2016-07-25 (×2): qty 1

## 2016-07-25 MED ORDER — MENTHOL 3 MG MT LOZG
1.0000 | LOZENGE | OROMUCOSAL | Status: DC | PRN
  Administered 2016-07-25: 3 mg via ORAL
  Filled 2016-07-25: qty 9

## 2016-07-25 MED ORDER — MAGIC MOUTHWASH W/LIDOCAINE
2.0000 mL | Freq: Three times a day (TID) | ORAL | Status: DC | PRN
  Administered 2016-07-25: 2 mL via ORAL
  Filled 2016-07-25 (×3): qty 5

## 2016-07-25 MED ORDER — DOCUSATE SODIUM 100 MG PO CAPS
100.0000 mg | ORAL_CAPSULE | Freq: Two times a day (BID) | ORAL | Status: DC
  Administered 2016-07-25 – 2016-07-26 (×2): 100 mg via ORAL
  Filled 2016-07-25 (×2): qty 1

## 2016-07-25 NOTE — Progress Notes (Signed)
PROGRESS NOTE    [name]  [id number] DOB: [birth year] DOA: 07/10/2016 PCP: Steve Rattler, DO  Brief Narrative: 70yo female former smoker, polymyalgia rheumatica, HTN, hypertrophic cardiomyopathy, CKD, AFib not candidate for anticoagulation r/t prior GI bleeding who was admitted 7/27 for elective TEE to evaluate L atrial appendage and place watchman device (left atrial appendage closure system for long term stroke prevention).  Pt was anemic on admit and decision was made to not proceed with watchman device (as it would require asa, coumadin for short time afterwards) but TEE was done to eval function and anatomy.  MD had significant difficulty passing the probe, which was eventually passed by anesthesia.  TEE showed ?stunned LV with severe hypokinesis and EF 20-25% and non obstructive HOCM.  However she had significant hypotension and increasing respiratory distress so she was intubated, started on pressors and tx to ICU post procedure.   Required Prolonged Vent support, ENT Dr.Wolicki was consulted, she was finally extubated on OR on 8/7 -Transfered to Floor to [facility] today 8/9  Assessment & Plan:  Acute resp failure -following TEE -prolonged Vent requirement had vocal cord edema that limited precluded Extubation earlier -FInally extubated on 8.7 in OR per ENT -wean O2 -s/p ENT eval by Dr.Wolicki -stable without issues at this time  Chronic Atrial Fib - Paced. Not candidate for anticoagulation r/t previous GI bleeding.  -Stunned LV - S/P TEE 7/27 w/ hypokinesis EF 20-25%. Complete Heart Block s/p Pacer Repaet ECHO with EF of 55% now -Cards signed off, FU with Dr.Allred, no plans for watchman device now  Hypotension due to drugs -Bp soft but improved,  this am,  norvasc on hold  Delirium -likely ICU delirium -minimize sedatives, re-orientation techniques -CT head unremarkable -mentation improving  CKD 3 -stable  H/o PMR -on chronic prednisone  5mg   Constipation -continue miralax, senna -also received enema in ICU  Hematuria -likley due to UTI +/- foley trauma while in ICU -foley removed prior to floor transfer -UA with large bacteria, LE and WBC in addition to RBCs, continue rocephin and check urine Cx, also hold lovenox, due to persistence of hematuria check Ct abd pelvis  DVT prophylaxis: held Lovenox due to hematuria,  SCDs Code Status: FUll Code Family Communication:None at bedside Disposition Plan: SNF , ? Tomorrow if hematuria continues to improve   Consultants:  PCCM, Cards, ENT  Procedures: TEE  Vent support   Subjective: feels well overall, unclear abt hematuria  Objective: Vitals:   07/25/16 0430 07/25/16 0741 07/25/16 0858 07/25/16 1155  BP: 108/61     Pulse: 75  75   Resp: (!) 24  (!) 25   Temp: 98.6 F (37 C) 98 F (36.7 C)  98.2 F (36.8 C)  TempSrc: Oral Oral    SpO2: 99%  100%   Weight:      Height:        Intake/Output Summary (Last 24 hours) at 07/25/16 1246 Last data filed at 07/25/16 1048  Gross per 24 hour  Intake              360 ml  Output              775 ml  Net             -415 ml   Filed Weights   07/21/16 0500 07/22/16 0500 07/24/16 0448  Weight: 80.1 kg (176 lb 9.4 oz) 76.5 kg (168 lb 10.4 oz) 73.2 kg (161 lb 4.8 oz)    Examination:  General exam: Appears calm and comfortable, alert, awake, oriented to self and place, more lucid today Respiratory system: Clear to auscultation. Respiratory effort normal. Cardiovascular system: S1 & S2 heard, RRR. No JVD, murmurs, rubs, gallops or clicks. No pedal edema. Gastrointestinal system: Abdomen is nondistended, soft and nontender. No organomegaly or masses felt. Normal bowel sounds heard. Central nervous system: Alert and oriented. No focal neurological deficits. Extremities: Symmetric 5 x 5 power. Skin: No rashes, lesions or ulcers Psychiatry: Judgement and insight appear normal. Mood & affect appropriate.     Data  Reviewed: I have personally reviewed following labs and imaging studies  CBC:  Recent Labs Lab 07/20/16 0330 07/21/16 0249 07/23/16 0404 07/24/16 0330 07/25/16 0310  WBC 7.3 8.0 7.0 4.9 5.1  NEUTROABS 5.2 6.1 5.4  --   --   HGB 9.4* 9.3* 10.2* 9.8* 9.5*  HCT 32.5* 32.0* 34.2* 32.8* 31.3*  MCV 87.8 87.9 87.7 85.4 85.1  PLT 270 270 269 288 [id number]   Basic Metabolic Panel:  Recent Labs Lab 07/19/16 0330 07/20/16 0330 07/21/16 0249 07/22/16 0320 07/23/16 0404 07/24/16 0340 07/25/16 0310  NA 144 145 143 139 138 134* 137  K 5.0 5.0 4.9 4.1 3.9 4.0 3.7  CL 104 106 105 101 100* 101 101  CO2 31 33* 32 31 27 25 25   GLUCOSE 126* 101* 97 114* 77 98 106*  BUN 65* 70* 60* 50* 40* 41* 34*  CREATININE 1.11* 1.16* 1.16* 1.13* 1.16* 1.27* 1.23*  CALCIUM 10.7* 10.0 9.9 9.9 9.5 8.9 9.1  MG 2.4 2.4 2.3 2.2  --   --   --   PHOS 3.6 4.3 3.1 4.2  --   --   --    GFR: Estimated Creatinine Clearance: 41.4 mL/min (by C-G formula based on SCr of 1.23 mg/dL). Liver Function Tests:  Recent Labs Lab 07/20/16 0330 07/21/16 0249  ALBUMIN 2.5* 2.4*   No results for input(s): LIPASE, AMYLASE in the last 168 hours. No results for input(s): AMMONIA in the last 168 hours. Coagulation Profile: No results for input(s): INR, PROTIME in the last 168 hours. Cardiac Enzymes: No results for input(s): CKTOTAL, CKMB, CKMBINDEX, TROPONINI in the last 168 hours. BNP (last 3 results) No results for input(s): PROBNP in the last 8760 hours. HbA1C: No results for input(s): HGBA1C in the last 72 hours. CBG:  Recent Labs Lab 07/24/16 1200 07/24/16 1609 07/24/16 2148 07/25/16 0720 07/25/16 1136  GLUCAP 144* 238* 93 120* 152*   Lipid Profile: No results for input(s): CHOL, HDL, LDLCALC, TRIG, CHOLHDL, LDLDIRECT in the last 72 hours. Thyroid Function Tests: No results for input(s): TSH, T4TOTAL, FREET4, T3FREE, THYROIDAB in the last 72 hours. Anemia Panel: No results for input(s): VITAMINB12, FOLATE,  FERRITIN, TIBC, IRON, RETICCTPCT in the last 72 hours. Urine analysis:    Component Value Date/Time   COLORURINE BROWN (A) 07/23/2016 1253   APPEARANCEUR TURBID (A) 07/23/2016 1253   LABSPEC 1.026 07/23/2016 1253   PHURINE 5.0 07/23/2016 1253   GLUCOSEU NEGATIVE 07/23/2016 1253   GLUCOSEU NEGATIVE 06/28/2012 1417   HGBUR LARGE (A) 07/23/2016 1253   HGBUR negative 03/12/2007 0905   BILIRUBINUR LARGE (A) 07/23/2016 1253   BILIRUBINUR NEG 12/26/2014 1139   KETONESUR 15 (A) 07/23/2016 1253   PROTEINUR >300 (A) 07/23/2016 1253   UROBILINOGEN 1.0 12/26/2014 1139   UROBILINOGEN 0.2 06/28/2012 1417   NITRITE POSITIVE (A) 07/23/2016 1253   LEUKOCYTESUR MODERATE (A) 07/23/2016 1253   Sepsis Labs: @LABRCNTIP (procalcitonin:4,lacticidven:4)  ) Recent Results (from the  past 240 hour(s))  Culture, Urine     Status: Abnormal (Preliminary result)   Collection Time: 07/23/16 12:53 PM  Result Value Ref Range Status   Specimen Description URINE, RANDOM  Final   Special Requests ADDED [id number]  Final   Culture 50,000 COLONIES/mL GRAM NEGATIVE RODS (A)  Final   Report Status PENDING  Incomplete         Radiology Studies: No results found.      Scheduled Meds: . antiseptic oral rinse  7 mL Mouth Rinse BID  . cefTRIAXone (ROCEPHIN)  IV  1 g Intravenous Q24H  . docusate  100 mg Per Tube BID  . feeding supplement (ENSURE ENLIVE)  237 mL Oral BID BM  . insulin aspart  0-15 Units Subcutaneous TID WC  . insulin aspart  0-5 Units Subcutaneous QHS  . ipratropium-albuterol  3 mL Nebulization TID  . magnesium hydroxide  30 mL Oral Daily  . pantoprazole  40 mg Oral BID AC  . polyethylene glycol  17 g Oral Daily  . predniSONE  5 mg Oral Q breakfast  . sennosides  5 mL Per Tube Q12H   Continuous Infusions: . sodium chloride Stopped (07/17/16 0300)  . feeding supplement (VITAL HIGH PROTEIN) Stopped (07/21/16 0000)     LOS: 15 days    Time spent: 63min    Domenic Polite,  MD [facility] Pager [phone]  If 7PM-7AM, please contact night-coverage [email or web address] Password [facility] 07/25/2016, 12:46 PM

## 2016-07-25 NOTE — Care Management Important Message (Signed)
Important Message  Patient Details  Name: [name] MRN: [id number] Date of Birth: 1946-04-17   Medicare Important Message Given:  Yes    Loann Quill 07/25/2016, 1:31 PM

## 2016-07-25 NOTE — Progress Notes (Signed)
OT Cancellation Note  Patient Details Name: [name] MRN: [id number] DOB: 1946/05/20   Cancelled Treatment:    Reason Eval/Treat Not Completed: Patient at procedure or test/ unavailable (CT)  Redmond Baseman, OTR/L Pager: [phone] 07/25/2016, 3:45 PM

## 2016-07-25 NOTE — Progress Notes (Signed)
Nutrition Follow-up  DOCUMENTATION CODES:   Not applicable  INTERVENTION:    Continue Ensure Enlive po BID, each supplement provides 350 kcal and 20 grams of protein.  D/C TF from med list as patient is no longer receiving.  NUTRITION DIAGNOSIS:   Inadequate oral intake related to acute illness as evidenced by per patient/family report.  Ongoing  GOAL:   Patient will meet greater than or equal to 90% of their needs  Progressing  MONITOR:   PO intake, Supplement acceptance, I & O's  ASSESSMENT:   70yo female with persistent AFib unable to be anticoagulated r/t previous GI bleeding and known hypertrophic cardiomyopathy admitted after hypotension and resp fx during TEE  Patient reports improved appetite. PO intake is okay. She has had 1-2 Ensure supplements over the past few days and is willing to continue to drink them between meals. TF has been off, but remains on med list, RD will d/c.   Diet Order:  Diet Carb Modified Fluid consistency: Thin; Room service appropriate? Yes  Skin:  Reviewed, no issues  Last BM:  8/8  Height:   Ht Readings from Last 1 Encounters:  07/21/16 5\' 5"  (1.651 m)    Weight:   Wt Readings from Last 1 Encounters:  07/24/16 161 lb 4.8 oz (73.2 kg)    Ideal Body Weight:  56.8 kg  BMI:  Body mass index is 26.84 kg/m.  Estimated Nutritional Needs:   Kcal:  1700-1900  Protein:  90-100 grams  Fluid:  > 1.5 L/day  EDUCATION NEEDS:   No education needs identified at this time  Molli Barrows, [place], [place], [place] Pager [phone] After Hours Pager [phone]

## 2016-07-26 DIAGNOSIS — J9601 Acute respiratory failure with hypoxia: Secondary | ICD-10-CM | POA: Diagnosis not present

## 2016-07-26 DIAGNOSIS — R600 Localized edema: Secondary | ICD-10-CM | POA: Diagnosis not present

## 2016-07-26 DIAGNOSIS — R310 Gross hematuria: Secondary | ICD-10-CM | POA: Diagnosis not present

## 2016-07-26 DIAGNOSIS — N183 Chronic kidney disease, stage 3 (moderate): Secondary | ICD-10-CM | POA: Diagnosis not present

## 2016-07-26 DIAGNOSIS — D508 Other iron deficiency anemias: Secondary | ICD-10-CM | POA: Diagnosis not present

## 2016-07-26 DIAGNOSIS — E785 Hyperlipidemia, unspecified: Secondary | ICD-10-CM | POA: Diagnosis not present

## 2016-07-26 DIAGNOSIS — J449 Chronic obstructive pulmonary disease, unspecified: Secondary | ICD-10-CM | POA: Diagnosis not present

## 2016-07-26 DIAGNOSIS — R0602 Shortness of breath: Secondary | ICD-10-CM | POA: Diagnosis not present

## 2016-07-26 DIAGNOSIS — R319 Hematuria, unspecified: Secondary | ICD-10-CM | POA: Diagnosis not present

## 2016-07-26 DIAGNOSIS — R278 Other lack of coordination: Secondary | ICD-10-CM | POA: Diagnosis not present

## 2016-07-26 DIAGNOSIS — R5381 Other malaise: Secondary | ICD-10-CM | POA: Diagnosis not present

## 2016-07-26 DIAGNOSIS — R062 Wheezing: Secondary | ICD-10-CM | POA: Diagnosis not present

## 2016-07-26 DIAGNOSIS — D649 Anemia, unspecified: Secondary | ICD-10-CM | POA: Diagnosis not present

## 2016-07-26 DIAGNOSIS — R531 Weakness: Secondary | ICD-10-CM | POA: Diagnosis not present

## 2016-07-26 DIAGNOSIS — I509 Heart failure, unspecified: Secondary | ICD-10-CM | POA: Diagnosis not present

## 2016-07-26 DIAGNOSIS — I5030 Unspecified diastolic (congestive) heart failure: Secondary | ICD-10-CM | POA: Diagnosis not present

## 2016-07-26 DIAGNOSIS — M6281 Muscle weakness (generalized): Secondary | ICD-10-CM | POA: Diagnosis not present

## 2016-07-26 DIAGNOSIS — M353 Polymyalgia rheumatica: Secondary | ICD-10-CM | POA: Diagnosis not present

## 2016-07-26 DIAGNOSIS — I482 Chronic atrial fibrillation: Secondary | ICD-10-CM | POA: Diagnosis not present

## 2016-07-26 DIAGNOSIS — I10 Essential (primary) hypertension: Secondary | ICD-10-CM | POA: Diagnosis not present

## 2016-07-26 DIAGNOSIS — J029 Acute pharyngitis, unspecified: Secondary | ICD-10-CM | POA: Diagnosis not present

## 2016-07-26 DIAGNOSIS — R58 Hemorrhage, not elsewhere classified: Secondary | ICD-10-CM | POA: Diagnosis not present

## 2016-07-26 DIAGNOSIS — N189 Chronic kidney disease, unspecified: Secondary | ICD-10-CM | POA: Diagnosis not present

## 2016-07-26 DIAGNOSIS — N390 Urinary tract infection, site not specified: Secondary | ICD-10-CM | POA: Diagnosis not present

## 2016-07-26 DIAGNOSIS — I4891 Unspecified atrial fibrillation: Secondary | ICD-10-CM | POA: Diagnosis not present

## 2016-07-26 LAB — CBC
HCT: 31.8 % — ABNORMAL LOW (ref 36.0–46.0)
Hemoglobin: 9.4 g/dL — ABNORMAL LOW (ref 12.0–15.0)
MCH: 25.1 pg — AB (ref 26.0–34.0)
MCHC: 29.6 g/dL — ABNORMAL LOW (ref 30.0–36.0)
MCV: 85 fL (ref 78.0–100.0)
PLATELETS: 292 10*3/uL (ref 150–400)
RBC: 3.74 MIL/uL — AB (ref 3.87–5.11)
RDW: 15.2 % (ref 11.5–15.5)
WBC: 4.5 10*3/uL (ref 4.0–10.5)

## 2016-07-26 LAB — BASIC METABOLIC PANEL
Anion gap: 10 (ref 5–15)
BUN: 21 mg/dL — AB (ref 6–20)
CALCIUM: 9.2 mg/dL (ref 8.9–10.3)
CO2: 23 mmol/L (ref 22–32)
CREATININE: 1.17 mg/dL — AB (ref 0.44–1.00)
Chloride: 104 mmol/L (ref 101–111)
GFR calc non Af Amer: 45 mL/min — ABNORMAL LOW (ref >60)
GFR, EST AFRICAN AMERICAN: 53 mL/min — AB (ref >60)
Glucose, Bld: 111 mg/dL — ABNORMAL HIGH (ref 65–99)
Potassium: 3.9 mmol/L (ref 3.5–5.1)
SODIUM: 137 mmol/L (ref 135–145)

## 2016-07-26 LAB — URINE CULTURE

## 2016-07-26 LAB — GLUCOSE, CAPILLARY
GLUCOSE-CAPILLARY: 122 mg/dL — AB (ref 65–99)
Glucose-Capillary: 133 mg/dL — ABNORMAL HIGH (ref 65–99)

## 2016-07-26 MED ORDER — ALPRAZOLAM 0.25 MG PO TABS
0.2500 mg | ORAL_TABLET | Freq: Once | ORAL | Status: AC
Start: 2016-07-26 — End: 2016-07-26
  Administered 2016-07-26: 0.25 mg via ORAL
  Filled 2016-07-26: qty 1

## 2016-07-26 MED ORDER — CEPHALEXIN 250 MG PO CAPS: 250.0000 mg | ORAL_CAPSULE | Freq: Three times a day (TID) | ORAL | Status: DC

## 2016-07-26 MED ORDER — ALPRAZOLAM 0.5 MG PO TABS: 0.5000 mg | ORAL_TABLET | Freq: Every evening | ORAL | 0 refills | Status: DC | PRN

## 2016-07-26 MED ORDER — FUROSEMIDE 20 MG PO TABS: ORAL_TABLET | ORAL | Status: DC

## 2016-07-26 MED ORDER — POLYETHYLENE GLYCOL 3350 17 G PO PACK: 17.0000 g | PACK | Freq: Every day | ORAL | 0 refills | Status: DC

## 2016-07-26 NOTE — Clinical Social Work Placement (Signed)
   CLINICAL SOCIAL WORK PLACEMENT  NOTE  Date:  07/26/2016  Patient Details  Name: [name] MRN: [id number] Date of Birth: 12-29-1945  Clinical Social Work is seeking post-discharge placement for this patient at the [place] level of care (*CSW will initial, date and re-position this form in  chart as items are completed):  Yes   Patient/family provided with [place] Work Department's list of facilities offering this level of care within the geographic area requested by the patient (or if unable, by the patient's family).  Yes   Patient/family informed of their freedom to choose among providers that offer the needed level of care, that participate in Medicare, Medicaid or managed care program needed by the patient, have an available bed and are willing to accept the patient.  Yes   Patient/family informed of [facility]'s ownership interest in [facility] and [facility], as well as of the fact that they are under no obligation to receive care at these facilities.  PASRR submitted to EDS on 07/23/16     PASRR number received on 07/23/16     Existing PASRR number confirmed on       FL2 transmitted to all facilities in geographic area requested by pt/family on 07/23/16     FL2 transmitted to all facilities within larger geographic area on       Patient informed that his/her managed care company has contracts with or will negotiate with certain facilities, including the following:        Yes   Patient/family informed of bed offers received.  Patient chooses bed at [facility]     Physician recommends and patient chooses bed at      Patient to be transferred to [facility] on 07/26/16.  Patient to be transferred to facility by Ambulance     Patient family notified on 07/26/16 of transfer.  Name of family member notified:  [name] at bedside     PHYSICIAN Please prepare priority discharge summary, including medications,  Please prepare prescriptions, Please sign FL2     Additional Comment:   Per MD patient ready for DC to [facility]. RN, patient, patient's family, and facility notified of DC. RN given number for report. DC packet on chart. Ambulance transport requested for patient for 4PM pickup. CSW signing off.  _______________________________________________ Rigoberto Noel, LCSW 07/26/2016, 1:52 PM

## 2016-07-26 NOTE — Discharge Summary (Signed)
Physician Discharge Summary  [name] [id number] DOB: [birth year] DOA: 07/10/2016  PCP: Steve Rattler, DO  Admit date: 07/10/2016 Discharge date: 07/26/2016  Time spent: 45 minutes  Recommendations for Outpatient Follow-up:  1. PCP in 1 week,  Pulmonary nodule noted on CT abd needs FU imaging 2. Dr.James Allred, EP, [facility] in 2 weeks  Discharge Diagnoses:  Active Problems:   Chronic atrial fibrillation (HCC)   Complete heart block (HCC)   Persistent atrial fibrillation (HCC)   Acute respiratory failure (HCC)   Hypotension due to drugs   Demand ischemia (HCC)   Respiratory failure (HCC)   Esophageal abnormality   Endotracheal tube present   Somnolence   Delirium   UTI   Hematuria  Discharge Condition: stable  Diet recommendation: heart healthy  Filed Weights   07/22/16 0500 07/24/16 0448 07/26/16 0533  Weight: 76.5 kg (168 lb 10.4 oz) 73.2 kg (161 lb 4.8 oz) 76.5 kg (168 lb 11.2 oz)    History of present illness:  70yo female former smoker, polymyalgia rheumatica, HTN, hypertrophic cardiomyopathy, CKD, AFib not candidate for anticoagulation r/t prior GI bleeding who was admitted 7/27 for elective TEE to evaluate L atrial appendage and place watchman device  Hospital Course:  70yo female former smoker, polymyalgia rheumatica, HTN, hypertrophic cardiomyopathy, CKD, AFib not candidate for anticoagulation r/t prior GI bleeding who was admitted 7/27 for elective TEE to evaluate L atrial appendage and place watchman device (left atrial appendage closure system for long term stroke prevention). Pt was anemic on admit and decision was made to not proceed with watchman device (as it would require asa, coumadin for short time afterwards) but TEE was done to eval function and anatomy. MD had significant difficulty passing the probe, which was eventually passed by anesthesia. TEE showed ?stunned LV with severe hypokinesis and EF 20-25% and non obstructive HOCM.  However she had significant hypotension and increasing respiratory distress so she was intubated, started on pressors and tx to ICU post procedure.  Required Prolonged Vent support, ENT Dr.Wolicki was consulted, she was finally extubated on OR on 8/7 -Transfered to Floor to [facility] on 8/9  Assessment & Plan:  Acute resp failure -following TEE -prolonged Vent requirement had vocal cord edema that limited precluded Extubation earlier -FInally extubated on 8.7 in OR per ENT -weaned O2 -s/p ENT eval by Dr.Wolicki -stable without issues at this time  Chronic Atrial Fib - Paced. Not candidate for anticoagulation due to previous GI bleeding per Cards. -Stunned LV - S/P TEE 7/27 w/ hypokinesis EF 20-25%. Complete Heart Block s/p Pacer Repaet ECHO with EF of 55% now -Cards signed off, FU with Dr.Allred, no plans for watchman device now due to difficulty with sedation and intubation during TEE. Coreg was stopped while she was in ICU, remains in NSR rate controlled, may need to resume at lower dose upon FU if afib recurs  Hypotension due to drugs -Bp soft but improved,   norvasc stopped  Delirium -likely ICU delirium -minimized sedatives, re-orientation techniques used -CT head unremarkable -mentation improved and back to baseline now, AAOx4  CKD 3 -stable  H/o PMR -on chronic prednisone 5mg   Constipation -continue miralax, senna -also received enema in ICU -resolved  Hematuria -likley due to UTI +/- foley trauma while in ICU -foley removed prior to floor transfer -UA with large bacteria, LE and WBC in addition to RBCs, treated with IV rocephin, urine Cx greater than 50K colonies of Ecoli, Abx changed to Keflex for few more days,  Ct abd pelvis was negative for pathology of hematuria. Finally hematuria cleared with Rx of UTI. If keeps recurring, needs Urology FU for cytoscopy  Procedures:  TEE  Prolonged Ventilation  Consultations:  ENT  Cardiology, EP  Discharge  Exam: Vitals:   07/26/16 0010 07/26/16 0533  BP: 108/83 106/80  Pulse: 75 73  Resp: (!) 25 (!) 23  Temp: 98.1 F (36.7 C) 98.7 F (37.1 C)    General: AAOx3 Cardiovascular: S1S2/RRR Respiratory: CTAB  Discharge Instructions    Current Discharge Medication List    START taking these medications   Details  cephALEXin (KEFLEX) 250 MG capsule Take 1 capsule (250 mg total) by mouth 3 (three) times daily. For 3days    polyethylene glycol (MIRALAX / GLYCOLAX) packet Take 17 g by mouth daily. Qty: 14 each, Refills: 0      CONTINUE these medications which have CHANGED   Details  ALPRAZolam (XANAX) 0.5 MG tablet Take 1 tablet (0.5 mg total) by mouth at bedtime as needed for anxiety. Qty: 15 tablet, Refills: 0    furosemide (LASIX) 20 MG tablet Take 1 tablet by mouth on Tues, Thurs, Sat and Sun      CONTINUE these medications which have NOT CHANGED   Details  acetaminophen (TYLENOL) 325 MG tablet Take 325 mg by mouth daily as needed (pain).    aspirin EC 81 MG tablet Take 81 mg by mouth daily.     CALCIUM-VITAMIN D PO Take 1 tablet by mouth every 14 (fourteen) days.     Coenzyme Q-10 200 MG CAPS Take 1 capsule by mouth as directed.     esomeprazole (NEXIUM) 40 MG capsule Take 40 mg by mouth daily.     ferrous sulfate 325 (65 FE) MG tablet TAKE 1 TABLET BY MOUTH TWICE DAILY WITH MEALS TO KEEP BLOOD COUNT UP Qty: 60 tablet, Refills: 11    Multiple Vitamin (MULTIVITAMIN) capsule Take 1 capsule by mouth daily.    predniSONE (DELTASONE) 1 MG tablet Take 5 tablets by mouth daily with breakfast Qty: 150 tablet, Refills: 0   Associated Diagnoses: Polymyalgia rheumatica (HCC)    PROAIR HFA 108 (90 Base) MCG/ACT inhaler INHALE 2 PUFFS INTO THE LUNGS EVERY 6 HOURS AS NEEDED FOR WHEEZING OR SHORTNESS OF BREATH Qty: 8.5 g, Refills: 3    rosuvastatin (CRESTOR) 20 MG tablet TAKE 1 TABLET BY MOUTH DAILY ON MONDAY, WEDNESDAY, AND FRIDAY Qty: 38 tablet, Refills: 3    senna  (SENOKOT) 8.6 MG tablet Take 1 tablet by mouth daily as needed for constipation.     nitroGLYCERIN (NITROSTAT) 0.4 MG SL tablet Place 1 tablet (0.4 mg total) under the tongue every 5 (five) minutes as needed for chest pain. Qty: 25 tablet, Refills: 1      STOP taking these medications     carvedilol (COREG) 12.5 MG tablet      sucralfate (CARAFATE) 1 g tablet        Allergies  Allergen Reactions  . Ace Inhibitors Other (See Comments)    Angioedema  . Propofol Anaphylaxis and Swelling      The results of significant diagnostics from this hospitalization (including imaging, microbiology, ancillary and laboratory) are listed below for reference.    Significant Diagnostic Studies: Ct Abdomen Pelvis Wo Contrast  Result Date: 07/25/2016 CLINICAL DATA:  70 year old with new onset of hematuria. EXAM: CT ABDOMEN AND PELVIS WITHOUT CONTRAST TECHNIQUE: Multidetector CT imaging of the abdomen and pelvis was performed following the standard protocol without IV contrast. COMPARISON:  None. FINDINGS: Lower chest: Focal volume loss at the lingula based. Cardiac leads extending into the right atrium and right ventricle. 3 mm nodule at the right lung base on sequence 205, image 12. Hepatobiliary: There is motion artifact along the inferior aspect of the liver and the gallbladder. No gross abnormality to the liver or gallbladder. Pancreas: Normal appearance of the pancreas without inflammation or duct dilatation. Spleen: Normal appearance of spleen without enlargement. Adrenals/Urinary Tract: Normal appearance of the adrenal glands. At least 2 cysts in the left kidney upper pole. There may be an additional cyst in the left kidney interpolar region. Small exophytic area along the left kidney lower pole may also represent a cyst but too small to definitively characterize. Probable cyst in the right kidney upper pole. Probable exophytic cyst in the right kidney interpolar region. Evidence for exophytic cyst in  the right kidney lower pole region. No evidence for hydronephrosis or stones. Normal appearance of the urinary bladder. Stomach/Bowel: No acute abnormality in the bowel structures. No evidence for obstruction. Normal appearance of the appendix. Vascular/Lymphatic: Atherosclerotic calcifications in the aorta and iliac arteries without aneurysm. No suspicious lymphadenopathy. Reproductive: No gross abnormality to the uterus or adnexal structures. Other: There may be trace fluid in the pelvis.  No free air. Musculoskeletal: Disc space narrowing at L5-S1. Subchondral sclerosis involving the superior left femoral head. Findings are suggestive for AVN. No evidence for femoral head collapse. IMPRESSION: No CT findings to explain hematuria. Negative for kidney stones or hydronephrosis. Multiple exophytic and low-density structures in both kidneys. These probably represent cysts but many are too small to definitively characterize, especially without contrast. Left femoral head AVN without collapse. 3 mm nodule at the right lung base is nonspecific. No follow-up needed if patient is low-risk. Non-contrast chest CT can be considered in 12 months if patient is high-risk. This recommendation follows the consensus statement: Guidelines for Management of Incidental Pulmonary Nodules Detected on CT Images:From the Fleischner Society 2017; published online before print (10.1148/radiol.IJ:2314499). Electronically Signed   By: Markus Daft M.D.   On: 07/25/2016 16:06   Ct Head Wo Contrast  Result Date: 07/15/2016 CLINICAL DATA:  Follow-up evaluation. RIGHT arm weakness. History of chronic renal failure, hyperlipidemia, hypertension. EXAM: CT HEAD WITHOUT CONTRAST TECHNIQUE: Contiguous axial images were obtained from the base of the skull through the vertex without intravenous contrast. COMPARISON:  CT HEAD July 13, 2016 in CT HEAD October 05, 2011. FINDINGS: INTRACRANIAL CONTENTS: The ventricles and sulci are normal for age. No  intraparenchymal hemorrhage, mass effect nor midline shift. Patchy supratentorial white matter hypodensities are less than expected for patient's age and though non-specific likely represent chronic small vessel ischemic disease. No acute large vascular territory infarcts. No abnormal extra-axial fluid collections. Basal cisterns are patent. Mild calcific atherosclerosis of the carotid siphons. ORBITS: The included ocular globes and orbital contents are non-suspicious. SINUSES: Lobulated maxillary sinusitis without antral expansion. Minimal LEFT mastoid effusion. SKULL/SOFT TISSUES: No skull fracture. No significant soft tissue swelling. Life-support lines in place. Stable 7 mm sclerotic focus LEFT clivus. IMPRESSION: Negative CT HEAD for age. Electronically Signed   By: Elon Alas M.D.   On: 07/15/2016 02:06   Ct Head Wo Contrast  Result Date: 07/13/2016 CLINICAL DATA:  Increased confusion, post respiratory arrest. EXAM: CT HEAD WITHOUT CONTRAST TECHNIQUE: Contiguous axial images were obtained from the base of the skull through the vertex without intravenous contrast. COMPARISON:  07/10/2016. FINDINGS: Brain: No evidence of acute infarction, hemorrhage, extra-axial collection, ventriculomegaly, or  mass effect. Mild brain parenchymal volume loss and periventricular microangiopathy. The gray-white matter differentiation is preserved. Vascular: No hyperdense vessel or unexpected calcification. Skull: Negative for fracture or focal lesion. Sinuses/Orbits: Near complete opacification of the bilateral maxillary sinuses. Other: None. IMPRESSION: No acute intracranial abnormality. No current evidence of anoxic injury to the brain. Mild brain parenchymal atrophy and chronic microvascular disease. Near complete opacification of bilateral maxillary sinuses suggestive of chronic sinusitis. Electronically Signed   By: Fidela Salisbury M.D.   On: 07/13/2016 11:41  Ct Head Wo Contrast  Result Date:  07/10/2016 CLINICAL DATA:  Altered mental status. Transesophageal echocardiogram today with difficult intubation and hypotension EXAM: CT HEAD WITHOUT CONTRAST TECHNIQUE: Contiguous axial images were obtained from the base of the skull through the vertex without intravenous contrast. COMPARISON:  CT head 10/05/2011 FINDINGS: Ventricle size normal.  Cerebral volume normal. Negative for acute infarct. Negative for hemorrhage or mass. No fluid collection or shift of the midline structures. Mucosal edema throughout the paranasal sinuses. Negative calvarium IMPRESSION: No acute intracranial abnormality. If there is concern of anoxic injury, follow-up CT may be helpful as ischemia may be missed on early CT. Electronically Signed   By: Franchot Gallo M.D.   On: 07/10/2016 15:42  Ct Soft Tissue Neck Wo Contrast  Result Date: 07/10/2016 CLINICAL DATA:  Transesophageal echo cardiogram today with difficult intubation. Hypotension. Rule out esophageal injury. EXAM: CT NECK WITHOUT CONTRAST TECHNIQUE: Multidetector CT imaging of the neck was performed following the standard protocol without intravenous contrast. COMPARISON:  None. FINDINGS: Pharynx and larynx: The patient is intubated. Pharyngeal soft tissues are collapsed around endotracheal tube and there may be some pharyngeal edema. Endotracheal tube in good position in the trachea above the carina. No focal hematoma. No soft tissue gas is seen that would suggests pharyngeal or esophageal perforation. No mass-effect or shift of the midline structures. Salivary glands: Parotid and submandibular glands normal bilaterally. Thyroid: Negative Lymph nodes: Negative for cervical lymphadenopathy. Vascular: Patency of vascular structures not assessed without intravenous contrast. Limited intracranial: Negative Visualized orbits: Negative Mastoids and visualized paranasal sinuses: Extensive mucosal edema throughout the paranasal sinuses bilaterally with near complete opacification  of the maxillary sinus bilaterally Skeleton: Mild degenerative changes in the cervical spine. No acute skeletal abnormality. Dental disease. Upper chest: Mild atelectasis in the right upper lobe.  No effusion. IMPRESSION: Endotracheal tube in good position. The pharynx is collapsed around endotracheal tube and there may be some pharyngeal edema. No hematoma. No gas in the soft tissues of the pharynx or proximal esophagus that would suggest acute injury. Negative for mass or adenopathy Extensive mucosal edema paranasal sinuses Right upper lobe atelectasis. Electronically Signed   By: Franchot Gallo M.D.   On: 07/10/2016 15:40  Ct Soft Tissue Neck W Contrast  Result Date: 07/17/2016 CLINICAL DATA:  Stridor. Evaluate pharyngeal edema. History of renal failure, heart failure EXAM: CT NECK WITH CONTRAST TECHNIQUE: Multidetector CT imaging of the neck was performed using the standard protocol following the bolus administration of intravenous contrast. CONTRAST:  40mL ISOVUE-300 IOPAMIDOL (ISOVUE-300) INJECTION 61% COMPARISON:  CT neck July 10, 2016 FINDINGS: Pharynx and larynx: Similarly effaced at the level of the oral pharynx, limited assessment due tube placement of life-support lines. Decreased pharyngeal edema. Preservation of parapharyngeal fat tissue planes. Salivary glands: Normal. Thyroid: Normal. Lymph nodes: No lymphadenopathy by CT size criteria. Vascular: Normal though not tailored for evaluation. Trace calcific atherosclerosis RIGHT carotid bulb. Limited intracranial: Normal. Visualized orbits: Normal. Mastoids and visualized paranasal sinuses:  Pan paranasal sinusitis, near complete of fat pace min of maxillary sinuses. Small LEFT mastoid effusion. Skeleton: No destructive bony lesions. Moderate to severe C5-6 disc height loss, uncovertebral hypertrophy compatible with degenerative disc. RIGHT greater than LEFT mandible molar periapical abscess. Upper chest: Lung apices are clear. No superior mediastinal  lymphadenopathy. Endotracheal tube tip above the carina. Nasogastric tube in place, distal tip not imaged. IMPRESSION: Life-support lines remain in place. Airway effacement, decreased pharyngeal edema. Electronically Signed   By: Elon Alas M.D.   On: 07/17/2016 03:49   Dg Chest Port 1 View  Result Date: 07/19/2016 CLINICAL DATA:  Acute respiratory failure EXAM: PORTABLE CHEST 1 VIEW COMPARISON:  07/18/2016 FINDINGS: Endotracheal tube and NG tube unchanged. RIGHT-sided pacemaker overlies the mild enlarged cardiac silhouette. There is LEFT basilar atelectasis. Mild central venous congestion. No pulmonary edema. No pneumothorax. IMPRESSION: 1. Stable support apparatus. 2. Mild central venous congestion and LEFT basilar atelectasis. Electronically Signed   By: Suzy Bouchard M.D.   On: 07/19/2016 07:14   Dg Chest Port 1 View  Result Date: 07/18/2016 CLINICAL DATA:  Acute renal failure, respiratory failure, intubated patient, CHF. EXAM: PORTABLE CHEST 1 VIEW COMPARISON:  Portable chest x-ray of July 17, 2016 FINDINGS: The patient is rotated on today's study. The lungs are well-expanded. The interstitial markings are coarse. The infrahilar lung markings on the left remain increased. The cardiac silhouette is enlarged. A prosthetic valve ring in what appears to be the mitral position is demonstrated. The pulmonary vascularity is not clearly engorged. There is no pleural effusion. The endotracheal tube tip lies 5.5 cm above the carina. The esophagogastric tube tip projects below the inferior margin of the image. The electrodes of the permanent pacemaker appear to be in stable position. IMPRESSION: Limited study due to persistent rotation toward the right. There is infrahilar infiltrate or atelectasis on the left not greatly changed. Cardiomegaly without pulmonary vascular congestion. No pleural effusion. The support tubes are in stable position. Electronically Signed   By: David  [name] M.D.   On:  07/18/2016 07:11   Dg Chest Port 1 View  Result Date: 07/17/2016 CLINICAL DATA:  Acute respiratory failure, chronic atrial fibrillation chronic renal insufficiency, COPD, former smoker. EXAM: PORTABLE CHEST 1 VIEW COMPARISON:  Portable chest x-ray of July 16, 2016 FINDINGS: The patient is rotated on this study. The lungs are reasonably well inflated. There is persistent increased density in the left infrahilar region and left lower lobe. The cardiac silhouette remains enlarged. The pulmonary vascularity is prominent centrally. There is no significant pleural effusion and no pneumothorax. The permanent pacemaker is in stable position. The endotracheal tube tip lies 4.8 cm above the carina. The esophagogastric tube tip projects below the inferior margin of the image. There are degenerative changes of the left shoulder. IMPRESSION: Persistent left lower lobe atelectasis or pneumonia. Stable cardiomegaly without significant pulmonary vascular congestion. The support tubes are in reasonable position Electronically Signed   By: David  [name] M.D.   On: 07/17/2016 07:52   Dg Chest Port 1 View  Result Date: 07/16/2016 CLINICAL DATA:  Acute respiratory failure, history of asthma, CHF. EXAM: PORTABLE CHEST 1 VIEW COMPARISON:  Portable chest x-ray of July 15, 2016 FINDINGS: The lungs are well-expanded. The interstitial markings are mildly prominent bilaterally. The cardiac silhouette is enlarged. The central pulmonary vascularity is engorged. Minimal pleural fluid at the left lung base is suspected. There is calcification in the wall of the aortic arch. The endotracheal tube tip lies 4.9 cm above  the carina. The esophagogastric tube tip projects below the inferior margin of the image. The permanent pacemaker electrodes appear to be in stable position where visualized. IMPRESSION: Slight overall increase in conspicuity of the pulmonary interstitium suggests low-grade interstitial edema stable cardiomegaly. Minimal left  basilar atelectasis and/or trace of pleural fluid. The support tubes are in reasonable position. Aortic atherosclerosis. Electronically Signed   By: David  [name] M.D.   On: 07/16/2016 07:20   Dg Chest Port 1 View  Result Date: 07/15/2016 CLINICAL DATA:  Evaluate placement of endotracheal tube. EXAM: PORTABLE CHEST 1 VIEW COMPARISON:  07/15/2016 at 5:08 a.m. FINDINGS: Endotracheal tube tip projects 2.6 cm above the carina. Oral/nasogastric tube passes below the diaphragm well into the stomach. Cardiac silhouette is enlarged. There is lung base opacity, left greater than right, most likely atelectasis. No convincing pulmonary edema. No pneumothorax. IMPRESSION: 1. Endotracheal tube tip now projects 2.6 cm above the carina. 2. Mild left greater than right basilar atelectasis is stable the prior exam. No new lung opacities. Electronically Signed   By: Lajean Manes M.D.   On: 07/15/2016 16:53   Dg Chest Port 1 View  Result Date: 07/15/2016 CLINICAL DATA:  Acute respiratory failure. EXAM: PORTABLE CHEST 1 VIEW COMPARISON:  07/14/2016 FINDINGS: Endotracheal tube terminates at the clavicular heads, unchanged. Enteric tube courses into the left upper abdomen with tip not imaged. Cardiac silhouette remains enlarged. Lung volumes are mildly diminished with new mild left basilar opacity which likely reflects atelectasis. There is minimal right basilar atelectasis which is slightly improved. No sizable pleural effusion or pneumothorax is identified. IMPRESSION: Bibasilar atelectasis. Electronically Signed   By: Logan Bores M.D.   On: 07/15/2016 07:32   Dg Chest Port 1 View  Result Date: 07/14/2016 CLINICAL DATA:  Hypoxia EXAM: PORTABLE CHEST 1 VIEW COMPARISON:  July 13, 2016 FINDINGS: Endotracheal tube tip is 4.3 cm above the carina. Nasogastric tube tip and side port are below the diaphragm. Pacemaker leads are attached to right atrium and right ventricle. No pneumothorax. There is slight right base atelectasis.  Lungs elsewhere are clear. There is cardiomegaly with pulmonary vascularity within normal limits. No adenopathy. There is atherosclerotic calcification in the aortic arch. IMPRESSION: Tube positions as described without pneumothorax. Stable cardiomegaly. Aortic atherosclerosis. Mild right base atelectasis. No new opacity. Electronically Signed   By: Lowella Grip III M.D.   On: 07/14/2016 07:22  Dg Chest Port 1 View  Result Date: 07/13/2016 CLINICAL DATA:  Acute respiratory failure EXAM: PORTABLE CHEST 1 VIEW COMPARISON:  07/11/2016 FINDINGS: Right pacer remains in place, unchanged. Endotracheal tube and NG tube are unchanged. There is cardiomegaly. Improving aeration in the bases with decreasing atelectasis. Minimal residual right base atelectasis. No effusions or edema. No acute bony abnormality. IMPRESSION: Cardiomegaly. Minimal residual right base atelectasis. Electronically Signed   By: Rolm Baptise M.D.   On: 07/13/2016 07:14  Dg Chest Port 1 View  Result Date: 07/11/2016 CLINICAL DATA:  Respiratory failure. EXAM: PORTABLE CHEST 1 VIEW COMPARISON:  07/10/2016. FINDINGS: Endotracheal tube, NG tube in stable position. Cardiac pacer in stable position. Stable cardiomegaly. Low lung volumes with mild bibasilar subsegmental atelectasis and or infiltrates/edema. No pleural effusion or pneumothorax. IMPRESSION: 1.  Lines and tubes in stable position. 2. Low lung volumes with mild bibasilar atelectasis and/or infiltrates/edema. 3. Cardiac pacer stable position.  Stable cardiomegaly. Electronically Signed   By: Marcello Moores  Register   On: 07/11/2016 06:59  Dg Chest Portable 1 View  Result Date: 07/10/2016 CLINICAL DATA:  Endotracheal tube and OG tube placements. EXAM: PORTABLE CHEST 1 VIEW COMPARISON:  Chest x-ray dated 02/04/2016. FINDINGS: Endotracheal tube is well positioned with tip approximately 3 cm above the carina. Enteric tube passes below the diaphragm. Cardiomediastinal silhouette is stable in  size and configuration. Mild interstitial edema again noted bilaterally. No pleural effusion or pneumothorax seen. Right chest wall pacemaker in place. Osseous and soft tissue structures about the chest otherwise unremarkable. IMPRESSION: Endotracheal tube well positioned with tip approximately 3 cm above the carina. OG tube passes below the diaphragm. Distal tip of the OG tube is below the lower limits of this chest x-ray. Cardiomegaly and interstitial edema suggesting mild CHF/volume overload. Electronically Signed   By: Franki Cabot M.D.   On: 07/10/2016 16:05   Microbiology: Recent Results (from the past 240 hour(s))  Culture, Urine     Status: Abnormal   Collection Time: 07/23/16 12:53 PM  Result Value Ref Range Status   Specimen Description URINE, RANDOM  Final   Special Requests ADDED YE:[id number]  Final   Culture 50,000 COLONIES/mL ESCHERICHIA COLI (A)  Final   Report Status 07/26/2016 FINAL  Final   Organism ID, Bacteria ESCHERICHIA COLI (A)  Final      Susceptibility   Escherichia coli - MIC*    AMPICILLIN 8 SENSITIVE Sensitive     CEFAZOLIN <=4 SENSITIVE Sensitive     CEFTRIAXONE <=1 SENSITIVE Sensitive     CIPROFLOXACIN <=0.25 SENSITIVE Sensitive     GENTAMICIN <=1 SENSITIVE Sensitive     IMIPENEM <=0.25 SENSITIVE Sensitive     NITROFURANTOIN <=16 SENSITIVE Sensitive     TRIMETH/SULFA <=20 SENSITIVE Sensitive     AMPICILLIN/SULBACTAM 4 SENSITIVE Sensitive     PIP/TAZO <=4 SENSITIVE Sensitive     Extended ESBL NEGATIVE Sensitive     * 50,000 COLONIES/mL ESCHERICHIA COLI     Labs: Basic Metabolic Panel:  Recent Labs Lab 07/20/16 0330 07/21/16 0249 07/22/16 0320 07/23/16 0404 07/24/16 0340 07/25/16 0310 07/26/16 0729  NA 145 143 139 138 134* 137 137  K 5.0 4.9 4.1 3.9 4.0 3.7 3.9  CL 106 105 101 100* 101 101 104  CO2 33* 32 31 27 25 25 23   GLUCOSE 101* 97 114* 77 98 106* 111*  BUN 70* 60* 50* 40* 41* 34* 21*  CREATININE 1.16* 1.16* 1.13* 1.16* 1.27* 1.23* 1.17*   CALCIUM 10.0 9.9 9.9 9.5 8.9 9.1 9.2  MG 2.4 2.3 2.2  --   --   --   --   PHOS 4.3 3.1 4.2  --   --   --   --    Liver Function Tests:  Recent Labs Lab 07/20/16 0330 07/21/16 0249  ALBUMIN 2.5* 2.4*   No results for input(s): LIPASE, AMYLASE in the last 168 hours. No results for input(s): AMMONIA in the last 168 hours. CBC:  Recent Labs Lab 07/20/16 0330 07/21/16 0249 07/23/16 0404 07/24/16 0330 07/25/16 0310 07/26/16 0729  WBC 7.3 8.0 7.0 4.9 5.1 4.5  NEUTROABS 5.2 6.1 5.4  --   --   --   HGB 9.4* 9.3* 10.2* 9.8* 9.5* 9.4*  HCT 32.5* 32.0* 34.2* 32.8* 31.3* 31.8*  MCV 87.8 87.9 87.7 85.4 85.1 85.0  PLT 270 270 269 288 283 292   Cardiac Enzymes: No results for input(s): CKTOTAL, CKMB, CKMBINDEX, TROPONINI in the last 168 hours. BNP: BNP (last 3 results)  Recent Labs  11/25/15 0030 02/05/16 0749  BNP 436.4* 144.0*    ProBNP (last 3 results)  No results for input(s): PROBNP in the last 8760 hours.  CBG:  Recent Labs Lab 07/25/16 0720 07/25/16 1136 07/25/16 1658 07/25/16 2053 07/26/16 0810  GLUCAP 120* 152* 155* 132* 122*       SignedDomenic Polite MD.  [facility] 07/26/2016, 10:34 AM

## 2016-07-28 ENCOUNTER — Encounter: Payer: Self-pay | Admitting: Internal Medicine

## 2016-07-28 ENCOUNTER — Non-Acute Institutional Stay (SKILLED_NURSING_FACILITY): Payer: Medicare Other | Admitting: Internal Medicine

## 2016-07-28 DIAGNOSIS — J029 Acute pharyngitis, unspecified: Secondary | ICD-10-CM

## 2016-07-28 DIAGNOSIS — N390 Urinary tract infection, site not specified: Secondary | ICD-10-CM | POA: Diagnosis not present

## 2016-07-28 DIAGNOSIS — K219 Gastro-esophageal reflux disease without esophagitis: Secondary | ICD-10-CM

## 2016-07-28 DIAGNOSIS — N183 Chronic kidney disease, stage 3 (moderate): Secondary | ICD-10-CM | POA: Diagnosis not present

## 2016-07-28 DIAGNOSIS — R319 Hematuria, unspecified: Secondary | ICD-10-CM | POA: Diagnosis not present

## 2016-07-28 DIAGNOSIS — K5900 Constipation, unspecified: Secondary | ICD-10-CM

## 2016-07-28 DIAGNOSIS — E46 Unspecified protein-calorie malnutrition: Secondary | ICD-10-CM | POA: Diagnosis not present

## 2016-07-28 DIAGNOSIS — I482 Chronic atrial fibrillation, unspecified: Secondary | ICD-10-CM

## 2016-07-28 DIAGNOSIS — D638 Anemia in other chronic diseases classified elsewhere: Secondary | ICD-10-CM

## 2016-07-28 DIAGNOSIS — I5030 Unspecified diastolic (congestive) heart failure: Secondary | ICD-10-CM | POA: Diagnosis not present

## 2016-07-28 DIAGNOSIS — R911 Solitary pulmonary nodule: Secondary | ICD-10-CM | POA: Diagnosis not present

## 2016-07-28 DIAGNOSIS — I442 Atrioventricular block, complete: Secondary | ICD-10-CM

## 2016-07-28 DIAGNOSIS — M353 Polymyalgia rheumatica: Secondary | ICD-10-CM

## 2016-07-28 DIAGNOSIS — E785 Hyperlipidemia, unspecified: Secondary | ICD-10-CM

## 2016-07-28 DIAGNOSIS — K5909 Other constipation: Secondary | ICD-10-CM

## 2016-07-28 DIAGNOSIS — B952 Enterococcus as the cause of diseases classified elsewhere: Secondary | ICD-10-CM

## 2016-07-28 DIAGNOSIS — R5381 Other malaise: Secondary | ICD-10-CM | POA: Diagnosis not present

## 2016-07-28 LAB — HEPATIC FUNCTION PANEL
ALK PHOS: 38 U/L (ref 25–125)
ALT: 21 U/L (ref 7–35)
AST: 26 U/L (ref 13–35)
Bilirubin, Total: 0.3 mg/dL

## 2016-07-28 LAB — BASIC METABOLIC PANEL
BUN: 21 mg/dL (ref 4–21)
CREATININE: 1.1 mg/dL (ref 0.5–1.1)
Glucose: 96 mg/dL
POTASSIUM: 4.1 mmol/L (ref 3.4–5.3)
SODIUM: 140 mmol/L (ref 137–147)

## 2016-07-28 NOTE — Progress Notes (Signed)
LOCATION: [name]  PCP: Steve Rattler, DO   Code Status: Full Code  Goals of care: Advanced Directive information Advanced Directives 07/14/2016  Does patient have an advance directive? No  Would patient like information on creating an advanced directive? -  Pre-existing out of facility DNR order (yellow form or pink MOST form) -       Extended Emergency Contact Information Primary Emergency Contact: McNair,Veronica Address:            [place], [place] [address] [place] Phone: [phone] Relation: Daughter Secondary Emergency Contact: Bary Castilla Address: [address]          [place], [place] [address] [place] [employer] Phone: [phone] Relation: Daughter   Allergies  Allergen Reactions  . Ace Inhibitors Other (See Comments)    Angioedema  . Propofol Anaphylaxis and Swelling    Chief Complaint  Patient presents with  . New Admit To SNF    New Admission     HPI:  Patient is a 70 y.o. female seen today for short term rehabilitation post hospital admission from 07/10/16-07/26/16 with acute respiratory failure requiring ventilator support following a TEE procedure for her chronic afib to evaluate left atrial appendage and place watchman procedure where she became hypotensive and went into respiratory distress. TEE on 07/10/16 showed hypokinesis with EF 20-25%. She required pressor support and while on mechanical ventilator developed vocal cord edema and could not be extubated. She finally got extubated on 07/21/16 and was then placed on o2 by nasal canula. During this admission, she developed hematuria thought to be from her foley catheter induced trauma and enterococcus UTI. She was placed on antibiotics and now her foley has been removed. She has PMH of CHF, afib, PMR, HTN, CKD 3 among others. She is seen in her room today with her daughter present.  Review of Systems:  Constitutional: Negative for fever, chills, diaphoresis.  Feels weak and tired. HENT: Negative for headache, congestion, difficulty swallowing. Positive for nasal discharge and soreness to back of her throat.   Eyes: Negative for blurred vision, double vision and discharge.  Respiratory: Negative for wheezing. Positive for cough with yellow phlegm and shortness of breath with exertion  Cardiovascular: Negative for chest pain, palpitations, leg swelling. Positive for chest wall soreness and has orthopnea. Gastrointestinal: Negative for heartburn, nausea, vomiting, abdominal pain. Last bowel movement was on Thursday. She was taking prune juice and enema at home Genitourinary: Negative for dysuria and flank pain. Positive for some hematuria Musculoskeletal: Negative for back pain, fall in the facility.  Skin: Negative for itching, rash.  Neurological: Negative for dizziness. Psychiatric/Behavioral: Negative for depression    Past Medical History:  Diagnosis Date  . Anemia   . Angina   . Angioedema    2/2 ACE  . Arteriovenous malformation of stomach   . Arthritis   . Asthma   . AVM (arteriovenous malformation) of colon    small intestine; stomach  . Blood transfusion   . CHF (congestive heart failure) ([facility])   . Complete heart block ([place] [facility])    s/p PPM 1998  . DDD (degenerative disc disease)   . Depression   . Diastolic heart failure   . Fatty liver 07/26/10  . GERD (gastroesophageal reflux disease)   . GI bleed   . History of alcohol abuse Stopped Fall 2012  . History of tobacco use Quit Fall 2012  . Hx of cardiovascular stress test    a. Lexiscan  Myoview (10/15):  Small inferolateral and apical defect c/w scar and poss soft tissue attenuation, no ischemia, EF 42%  . Hx of colonic polyp 08/13/10   adenomatous  . Hx of colonoscopy   . Hyperlipidemia   . Hyperlipidemia   . Hypertension   . Hypertrophic cardiomyopathy ([facility])    dx by Dr [name] 2009  . Iron deficiency anemia   . Panic attack   . Panic attacks   . Permanent atrial  fibrillation ([facility])   . Renal failure    baseline creatinine 1.6  . Right arm pain 01/08/2012  . RLS (restless legs syndrome)    Dx 06/2007  . Shortness of breath    sob on exertation  . Sleep apnea    Past Surgical History:  Procedure Laterality Date  . CARDIAC CATHETERIZATION    . ESOPHAGOGASTRODUODENOSCOPY  12/23/2011   Procedure: ESOPHAGOGASTRODUODENOSCOPY (EGD);  Surgeon: [name], MD;  Location: [facility];  Service: Endoscopy;  Laterality: N/A;  . EXTUBATION (ENDOTRACHEAL) IN OR N/A 07/21/2016   Procedure: EXTUBATION (ENDOTRACHEAL) IN OR;  Surgeon: [name], MD;  Location: [facility];  Service: ENT;  Laterality: N/A;  . GIVENS CAPSULE STUDY  12/23/2011   Procedure: GIVENS CAPSULE STUDY;  Surgeon: [name], MD;  Location: [facility];  Service: Endoscopy;  Laterality: N/A;  . PACEMAKER INSERTION  1998   st jude, most recent gen change by [name] 4/12  . TUBAL LIGATION  04/01/2000   Social History:   reports that she quit smoking about 4 years ago. Her smoking use included Cigarettes. She has a 5.00 pack-year smoking history. She has never used smokeless tobacco. She reports that she drinks alcohol. She reports that she does not use drugs.  Family History  Problem Relation Age of Onset  . Hypertension Mother   . Stroke Mother   . Heart disease Mother   . Aneurysm Mother   . CVA Mother   . Hypertension Father   . Stroke Father   . Heart attack Father   . Alcohol abuse Father   . Hypertension Sister   . Hypertension Brother   . Colon cancer Brother   . Diabetes Sister   . Diabetes Brother   . Cancer Brother   . Heart attack Brother   . Heart disease Sister   . Alcohol abuse Brother     Medications:   Medication List       Accurate as of 07/28/16  2:43 PM. Always use your most recent med list.          acetaminophen 325 MG tablet Commonly known as:  TYLENOL Take 325 mg by mouth daily as needed (pain).   ALPRAZolam 0.5 MG tablet Commonly known as:   XANAX Take 1 tablet (0.5 mg total) by mouth at bedtime as needed for anxiety.   aspirin EC 81 MG tablet Take 81 mg by mouth daily.   cephALEXin 250 MG capsule Commonly known as:  KEFLEX Take 1 capsule (250 mg total) by mouth 3 (three) times daily. For 3days   Coenzyme Q-10 200 MG Caps Take 1 capsule by mouth as directed.   esomeprazole 40 MG capsule Commonly known as:  NEXIUM Take 40 mg by mouth daily.   ferrous sulfate 325 (65 FE) MG tablet TAKE 1 TABLET BY MOUTH TWICE DAILY WITH MEALS TO KEEP BLOOD COUNT UP   furosemide 20 MG tablet Commonly known as:  LASIX Take 1 tablet by mouth on Tues, Thurs, Sat and Sun  multivitamin capsule Take 1 capsule by mouth daily.   nitroGLYCERIN 0.4 MG SL tablet Commonly known as:  NITROSTAT Place 1 tablet (0.4 mg total) under the tongue every 5 (five) minutes as needed for chest pain.   polyethylene glycol packet Commonly known as:  MIRALAX / GLYCOLAX Take 17 g by mouth daily.   predniSONE 1 MG tablet Commonly known as:  DELTASONE Take 5 tablets by mouth daily with breakfast   PROAIR HFA 108 (90 Base) MCG/ACT inhaler Generic drug:  albuterol INHALE 2 PUFFS INTO THE LUNGS EVERY 6 HOURS AS NEEDED FOR WHEEZING OR SHORTNESS OF BREATH   rosuvastatin 20 MG tablet Commonly known as:  CRESTOR TAKE 1 TABLET BY MOUTH DAILY ON MONDAY, WEDNESDAY, AND FRIDAY   senna 8.6 MG tablet Commonly known as:  SENOKOT Take 1 tablet by mouth daily as needed for constipation.       Immunizations: Immunization History  Administered Date(s) Administered  . Influenza Split 01/08/2012  . Influenza,inj,Quad PF,36+ Mos 11/26/2015  . PPD Test 07/26/2016  . Pneumococcal Conjugate-13 04/27/2014  . Pneumococcal Polysaccharide-23 12/23/2010  . Td 03/15/2004     Physical Exam:  Vitals:   07/28/16 1436  BP: 140/87  Pulse: 70  Resp: 18  Temp: 97.6 F (36.4 C)  TempSrc: Oral  SpO2: 97%  Weight: 168 lb 11.2 oz (76.5 kg)  Height: 5\' 5"  (1.651 m)    Body mass index is 28.07 kg/m.  General- elderly female, well built, in no acute distress Head- normocephalic, atraumatic Nose- no nasal discharge Throat- moist mucus membrane, normal oropharynx  Eyes- PERRLA, EOMI, no pallor, no icterus Neck- no cervical lymphadenopathy Cardiovascular- irregular heart rate, no murmur Respiratory- bilateral clear to auscultation, no wheeze, no rhonchi, no crackles, no use of accessory muscles Abdomen- bowel sounds present, soft, non tender Musculoskeletal- able to move all 4 extremities, generalized weakness Neurological- alert and oriented to person, place and time Skin- warm and dry Psychiatry- normal mood and affect    Labs reviewed: Basic Metabolic Panel:  Recent Labs  07/20/16 0330 07/21/16 0249 07/22/16 0320  07/24/16 0340 07/25/16 0310 07/26/16 0729  NA 145 143 139  < > 134* 137 137  K 5.0 4.9 4.1  < > 4.0 3.7 3.9  CL 106 105 101  < > 101 101 104  CO2 33* 32 31  < > 25 25 23   GLUCOSE 101* 97 114*  < > 98 106* 111*  BUN 70* 60* 50*  < > 41* 34* 21*  CREATININE 1.16* 1.16* 1.13*  < > 1.27* 1.23* 1.17*  CALCIUM 10.0 9.9 9.9  < > 8.9 9.1 9.2  MG 2.4 2.3 2.2  --   --   --   --   PHOS 4.3 3.1 4.2  --   --   --   --   < > = values in this interval not displayed. Liver Function Tests:  Recent Labs  02/05/16 0749 02/06/16 0757 07/20/16 0330 07/21/16 0249  AST 23 20  --   --   ALT 12* 11*  --   --   ALKPHOS 38 33*  --   --   BILITOT 0.5 0.2*  --   --   PROT 7.1 6.1*  --   --   ALBUMIN 3.5 3.0* 2.5* 2.4*   No results for input(s): LIPASE, AMYLASE in the last 8760 hours. No results for input(s): AMMONIA in the last 8760 hours. CBC:  Recent Labs  07/20/16 0330 07/21/16 0249 07/23/16 0404  07/24/16 0330 07/25/16 0310 07/26/16 0729  WBC 7.3 8.0 7.0 4.9 5.1 4.5  NEUTROABS 5.2 6.1 5.4  --   --   --   HGB 9.4* 9.3* 10.2* 9.8* 9.5* 9.4*  HCT 32.5* 32.0* 34.2* 32.8* 31.3* 31.8*  MCV 87.8 87.9 87.7 85.4 85.1 85.0  PLT 270  270 269 288 283 292   Cardiac Enzymes:  Recent Labs  02/05/16 2159 02/06/16 0757 02/06/16 1402  TROPONINI 0.38* 0.37* 0.39*   BNP: Invalid input(s): POCBNP CBG:  Recent Labs  07/25/16 2053 07/26/16 0810 07/26/16 1123  GLUCAP 132* 122* 133*    Radiological Exams: Ct Abdomen Pelvis Wo Contrast  Result Date: 07/25/2016 CLINICAL DATA:  69 year old with new onset of hematuria. EXAM: CT ABDOMEN AND PELVIS WITHOUT CONTRAST TECHNIQUE: Multidetector CT imaging of the abdomen and pelvis was performed following the standard protocol without IV contrast. COMPARISON:  None. FINDINGS: Lower chest: Focal volume loss at the lingula based. Cardiac leads extending into the right atrium and right ventricle. 3 mm nodule at the right lung base on sequence 205, image 12. Hepatobiliary: There is motion artifact along the inferior aspect of the liver and the gallbladder. No gross abnormality to the liver or gallbladder. Pancreas: Normal appearance of the pancreas without inflammation or duct dilatation. Spleen: Normal appearance of spleen without enlargement. Adrenals/Urinary Tract: Normal appearance of the adrenal glands. At least 2 cysts in the left kidney upper pole. There may be an additional cyst in the left kidney interpolar region. Small exophytic area along the left kidney lower pole may also represent a cyst but too small to definitively characterize. Probable cyst in the right kidney upper pole. Probable exophytic cyst in the right kidney interpolar region. Evidence for exophytic cyst in the right kidney lower pole region. No evidence for hydronephrosis or stones. Normal appearance of the urinary bladder. Stomach/Bowel: No acute abnormality in the bowel structures. No evidence for obstruction. Normal appearance of the appendix. Vascular/Lymphatic: Atherosclerotic calcifications in the aorta and iliac arteries without aneurysm. No suspicious lymphadenopathy. Reproductive: No gross abnormality to the  uterus or adnexal structures. Other: There may be trace fluid in the pelvis.  No free air. Musculoskeletal: Disc space narrowing at L5-S1. Subchondral sclerosis involving the superior left femoral head. Findings are suggestive for AVN. No evidence for femoral head collapse. IMPRESSION: No CT findings to explain hematuria. Negative for kidney stones or hydronephrosis. Multiple exophytic and low-density structures in both kidneys. These probably represent cysts but many are too small to definitively characterize, especially without contrast. Left femoral head AVN without collapse. 3 mm nodule at the right lung base is nonspecific. No follow-up needed if patient is low-risk. Non-contrast chest CT can be considered in 12 months if patient is high-risk. This recommendation follows the consensus statement: Guidelines for Management of Incidental Pulmonary Nodules Detected on CT Images:From the Fleischner Society 2017; published online before print (10.1148/radiol.SG:[id number]). Electronically Signed   By: Markus Daft M.D.   On: 07/25/2016 16:06   Ct Head Wo Contrast  Result Date: 07/15/2016 CLINICAL DATA:  Follow-up evaluation. RIGHT arm weakness. History of chronic renal failure, hyperlipidemia, hypertension. EXAM: CT HEAD WITHOUT CONTRAST TECHNIQUE: Contiguous axial images were obtained from the base of the skull through the vertex without intravenous contrast. COMPARISON:  CT HEAD July 13, 2016 in CT HEAD October 05, 2011. FINDINGS: INTRACRANIAL CONTENTS: The ventricles and sulci are normal for age. No intraparenchymal hemorrhage, mass effect nor midline shift. Patchy supratentorial white matter hypodensities are less than expected for patient's  age and though non-specific likely represent chronic small vessel ischemic disease. No acute large vascular territory infarcts. No abnormal extra-axial fluid collections. Basal cisterns are patent. Mild calcific atherosclerosis of the carotid siphons. ORBITS: The included  ocular globes and orbital contents are non-suspicious. SINUSES: Lobulated maxillary sinusitis without antral expansion. Minimal LEFT mastoid effusion. SKULL/SOFT TISSUES: No skull fracture. No significant soft tissue swelling. Life-support lines in place. Stable 7 mm sclerotic focus LEFT clivus. IMPRESSION: Negative CT HEAD for age. Electronically Signed   By: Elon Alas M.D.   On: 07/15/2016 02:06   Ct Head Wo Contrast  Result Date: 07/13/2016 CLINICAL DATA:  Increased confusion, post respiratory arrest. EXAM: CT HEAD WITHOUT CONTRAST TECHNIQUE: Contiguous axial images were obtained from the base of the skull through the vertex without intravenous contrast. COMPARISON:  07/10/2016. FINDINGS: Brain: No evidence of acute infarction, hemorrhage, extra-axial collection, ventriculomegaly, or mass effect. Mild brain parenchymal volume loss and periventricular microangiopathy. The gray-white matter differentiation is preserved. Vascular: No hyperdense vessel or unexpected calcification. Skull: Negative for fracture or focal lesion. Sinuses/Orbits: Near complete opacification of the bilateral maxillary sinuses. Other: None. IMPRESSION: No acute intracranial abnormality. No current evidence of anoxic injury to the brain. Mild brain parenchymal atrophy and chronic microvascular disease. Near complete opacification of bilateral maxillary sinuses suggestive of chronic sinusitis. Electronically Signed   By: Fidela Salisbury M.D.   On: 07/13/2016 11:41  Ct Head Wo Contrast  Result Date: 07/10/2016 CLINICAL DATA:  Altered mental status. Transesophageal echocardiogram today with difficult intubation and hypotension EXAM: CT HEAD WITHOUT CONTRAST TECHNIQUE: Contiguous axial images were obtained from the base of the skull through the vertex without intravenous contrast. COMPARISON:  CT head 10/05/2011 FINDINGS: Ventricle size normal.  Cerebral volume normal. Negative for acute infarct. Negative for hemorrhage or  mass. No fluid collection or shift of the midline structures. Mucosal edema throughout the paranasal sinuses. Negative calvarium IMPRESSION: No acute intracranial abnormality. If there is concern of anoxic injury, follow-up CT may be helpful as ischemia may be missed on early CT. Electronically Signed   By: Franchot Gallo M.D.   On: 07/10/2016 15:42  Ct Soft Tissue Neck Wo Contrast  Result Date: 07/10/2016 CLINICAL DATA:  Transesophageal echo cardiogram today with difficult intubation. Hypotension. Rule out esophageal injury. EXAM: CT NECK WITHOUT CONTRAST TECHNIQUE: Multidetector CT imaging of the neck was performed following the standard protocol without intravenous contrast. COMPARISON:  None. FINDINGS: Pharynx and larynx: The patient is intubated. Pharyngeal soft tissues are collapsed around endotracheal tube and there may be some pharyngeal edema. Endotracheal tube in good position in the trachea above the carina. No focal hematoma. No soft tissue gas is seen that would suggests pharyngeal or esophageal perforation. No mass-effect or shift of the midline structures. Salivary glands: Parotid and submandibular glands normal bilaterally. Thyroid: Negative Lymph nodes: Negative for cervical lymphadenopathy. Vascular: Patency of vascular structures not assessed without intravenous contrast. Limited intracranial: Negative Visualized orbits: Negative Mastoids and visualized paranasal sinuses: Extensive mucosal edema throughout the paranasal sinuses bilaterally with near complete opacification of the maxillary sinus bilaterally Skeleton: Mild degenerative changes in the cervical spine. No acute skeletal abnormality. Dental disease. Upper chest: Mild atelectasis in the right upper lobe.  No effusion. IMPRESSION: Endotracheal tube in good position. The pharynx is collapsed around endotracheal tube and there may be some pharyngeal edema. No hematoma. No gas in the soft tissues of the pharynx or proximal esophagus that  would suggest acute injury. Negative for mass or adenopathy Extensive mucosal  edema paranasal sinuses Right upper lobe atelectasis. Electronically Signed   By: Franchot Gallo M.D.   On: 07/10/2016 15:40  Ct Soft Tissue Neck W Contrast  Result Date: 07/17/2016 CLINICAL DATA:  Stridor. Evaluate pharyngeal edema. History of renal failure, heart failure EXAM: CT NECK WITH CONTRAST TECHNIQUE: Multidetector CT imaging of the neck was performed using the standard protocol following the bolus administration of intravenous contrast. CONTRAST:  20mL ISOVUE-300 IOPAMIDOL (ISOVUE-300) INJECTION 61% COMPARISON:  CT neck July 10, 2016 FINDINGS: Pharynx and larynx: Similarly effaced at the level of the oral pharynx, limited assessment due tube placement of life-support lines. Decreased pharyngeal edema. Preservation of parapharyngeal fat tissue planes. Salivary glands: Normal. Thyroid: Normal. Lymph nodes: No lymphadenopathy by CT size criteria. Vascular: Normal though not tailored for evaluation. Trace calcific atherosclerosis RIGHT carotid bulb. Limited intracranial: Normal. Visualized orbits: Normal. Mastoids and visualized paranasal sinuses: Pan paranasal sinusitis, near complete of fat pace min of maxillary sinuses. Small LEFT mastoid effusion. Skeleton: No destructive bony lesions. Moderate to severe C5-6 disc height loss, uncovertebral hypertrophy compatible with degenerative disc. RIGHT greater than LEFT mandible molar periapical abscess. Upper chest: Lung apices are clear. No superior mediastinal lymphadenopathy. Endotracheal tube tip above the carina. Nasogastric tube in place, distal tip not imaged. IMPRESSION: Life-support lines remain in place. Airway effacement, decreased pharyngeal edema. Electronically Signed   By: Elon Alas M.D.   On: 07/17/2016 03:49   Dg Chest Port 1 View  Result Date: 07/19/2016 CLINICAL DATA:  Acute respiratory failure EXAM: PORTABLE CHEST 1 VIEW COMPARISON:  07/18/2016 FINDINGS:  Endotracheal tube and NG tube unchanged. RIGHT-sided pacemaker overlies the mild enlarged cardiac silhouette. There is LEFT basilar atelectasis. Mild central venous congestion. No pulmonary edema. No pneumothorax. IMPRESSION: 1. Stable support apparatus. 2. Mild central venous congestion and LEFT basilar atelectasis. Electronically Signed   By: Suzy Bouchard M.D.   On: 07/19/2016 07:14   Dg Chest Port 1 View  Result Date: 07/18/2016 CLINICAL DATA:  Acute renal failure, respiratory failure, intubated patient, CHF. EXAM: PORTABLE CHEST 1 VIEW COMPARISON:  Portable chest x-ray of July 17, 2016 FINDINGS: The patient is rotated on today's study. The lungs are well-expanded. The interstitial markings are coarse. The infrahilar lung markings on the left remain increased. The cardiac silhouette is enlarged. A prosthetic valve ring in what appears to be the mitral position is demonstrated. The pulmonary vascularity is not clearly engorged. There is no pleural effusion. The endotracheal tube tip lies 5.5 cm above the carina. The esophagogastric tube tip projects below the inferior margin of the image. The electrodes of the permanent pacemaker appear to be in stable position. IMPRESSION: Limited study due to persistent rotation toward the right. There is infrahilar infiltrate or atelectasis on the left not greatly changed. Cardiomegaly without pulmonary vascular congestion. No pleural effusion. The support tubes are in stable position. Electronically Signed   By: David  [name] M.D.   On: 07/18/2016 07:11   Dg Chest Port 1 View  Result Date: 07/17/2016 CLINICAL DATA:  Acute respiratory failure, chronic atrial fibrillation chronic renal insufficiency, COPD, former smoker. EXAM: PORTABLE CHEST 1 VIEW COMPARISON:  Portable chest x-ray of July 16, 2016 FINDINGS: The patient is rotated on this study. The lungs are reasonably well inflated. There is persistent increased density in the left infrahilar region and left lower  lobe. The cardiac silhouette remains enlarged. The pulmonary vascularity is prominent centrally. There is no significant pleural effusion and no pneumothorax. The permanent pacemaker is in stable position.  The endotracheal tube tip lies 4.8 cm above the carina. The esophagogastric tube tip projects below the inferior margin of the image. There are degenerative changes of the left shoulder. IMPRESSION: Persistent left lower lobe atelectasis or pneumonia. Stable cardiomegaly without significant pulmonary vascular congestion. The support tubes are in reasonable position Electronically Signed   By: David  [name] M.D.   On: 07/17/2016 07:52   Dg Chest Port 1 View  Result Date: 07/16/2016 CLINICAL DATA:  Acute respiratory failure, history of asthma, CHF. EXAM: PORTABLE CHEST 1 VIEW COMPARISON:  Portable chest x-ray of July 15, 2016 FINDINGS: The lungs are well-expanded. The interstitial markings are mildly prominent bilaterally. The cardiac silhouette is enlarged. The central pulmonary vascularity is engorged. Minimal pleural fluid at the left lung base is suspected. There is calcification in the wall of the aortic arch. The endotracheal tube tip lies 4.9 cm above the carina. The esophagogastric tube tip projects below the inferior margin of the image. The permanent pacemaker electrodes appear to be in stable position where visualized. IMPRESSION: Slight overall increase in conspicuity of the pulmonary interstitium suggests low-grade interstitial edema stable cardiomegaly. Minimal left basilar atelectasis and/or trace of pleural fluid. The support tubes are in reasonable position. Aortic atherosclerosis. Electronically Signed   By: David  [name] M.D.   On: 07/16/2016 07:20   Dg Chest Port 1 View  Result Date: 07/15/2016 CLINICAL DATA:  Evaluate placement of endotracheal tube. EXAM: PORTABLE CHEST 1 VIEW COMPARISON:  07/15/2016 at 5:08 a.m. FINDINGS: Endotracheal tube tip projects 2.6 cm above the carina.  Oral/nasogastric tube passes below the diaphragm well into the stomach. Cardiac silhouette is enlarged. There is lung base opacity, left greater than right, most likely atelectasis. No convincing pulmonary edema. No pneumothorax. IMPRESSION: 1. Endotracheal tube tip now projects 2.6 cm above the carina. 2. Mild left greater than right basilar atelectasis is stable the prior exam. No new lung opacities. Electronically Signed   By: Lajean Manes M.D.   On: 07/15/2016 16:53   Dg Chest Port 1 View  Result Date: 07/15/2016 CLINICAL DATA:  Acute respiratory failure. EXAM: PORTABLE CHEST 1 VIEW COMPARISON:  07/14/2016 FINDINGS: Endotracheal tube terminates at the clavicular heads, unchanged. Enteric tube courses into the left upper abdomen with tip not imaged. Cardiac silhouette remains enlarged. Lung volumes are mildly diminished with new mild left basilar opacity which likely reflects atelectasis. There is minimal right basilar atelectasis which is slightly improved. No sizable pleural effusion or pneumothorax is identified. IMPRESSION: Bibasilar atelectasis. Electronically Signed   By: Logan Bores M.D.   On: 07/15/2016 07:32   Dg Chest Port 1 View  Result Date: 07/14/2016 CLINICAL DATA:  Hypoxia EXAM: PORTABLE CHEST 1 VIEW COMPARISON:  July 13, 2016 FINDINGS: Endotracheal tube tip is 4.3 cm above the carina. Nasogastric tube tip and side port are below the diaphragm. Pacemaker leads are attached to right atrium and right ventricle. No pneumothorax. There is slight right base atelectasis. Lungs elsewhere are clear. There is cardiomegaly with pulmonary vascularity within normal limits. No adenopathy. There is atherosclerotic calcification in the aortic arch. IMPRESSION: Tube positions as described without pneumothorax. Stable cardiomegaly. Aortic atherosclerosis. Mild right base atelectasis. No new opacity. Electronically Signed   By: Lowella Grip III M.D.   On: 07/14/2016 07:22  Dg Chest Port 1  View  Result Date: 07/13/2016 CLINICAL DATA:  Acute respiratory failure EXAM: PORTABLE CHEST 1 VIEW COMPARISON:  07/11/2016 FINDINGS: Right pacer remains in place, unchanged. Endotracheal tube and NG tube are unchanged.  There is cardiomegaly. Improving aeration in the bases with decreasing atelectasis. Minimal residual right base atelectasis. No effusions or edema. No acute bony abnormality. IMPRESSION: Cardiomegaly. Minimal residual right base atelectasis. Electronically Signed   By: Rolm Baptise M.D.   On: 07/13/2016 07:14  Dg Chest Port 1 View  Result Date: 07/11/2016 CLINICAL DATA:  Respiratory failure. EXAM: PORTABLE CHEST 1 VIEW COMPARISON:  07/10/2016. FINDINGS: Endotracheal tube, NG tube in stable position. Cardiac pacer in stable position. Stable cardiomegaly. Low lung volumes with mild bibasilar subsegmental atelectasis and or infiltrates/edema. No pleural effusion or pneumothorax. IMPRESSION: 1.  Lines and tubes in stable position. 2. Low lung volumes with mild bibasilar atelectasis and/or infiltrates/edema. 3. Cardiac pacer stable position.  Stable cardiomegaly. Electronically Signed   By: Marcello Moores  Register   On: 07/11/2016 06:59  Dg Chest Portable 1 View  Result Date: 07/10/2016 CLINICAL DATA:  Endotracheal tube and OG tube placements. EXAM: PORTABLE CHEST 1 VIEW COMPARISON:  Chest x-ray dated 02/04/2016. FINDINGS: Endotracheal tube is well positioned with tip approximately 3 cm above the carina. Enteric tube passes below the diaphragm. Cardiomediastinal silhouette is stable in size and configuration. Mild interstitial edema again noted bilaterally. No pleural effusion or pneumothorax seen. Right chest wall pacemaker in place. Osseous and soft tissue structures about the chest otherwise unremarkable. IMPRESSION: Endotracheal tube well positioned with tip approximately 3 cm above the carina. OG tube passes below the diaphragm. Distal tip of the OG tube is below the lower limits of this chest  x-ray. Cardiomegaly and interstitial edema suggesting mild CHF/volume overload. Electronically Signed   By: Franki Cabot M.D.   On: 07/10/2016 16:05   Assessment/Plan  Physical deconditioning Will have her work with physical therapy and occupational therapy team to help with gait training and muscle strengthening exercises.fall precautions. Skin care. Encourage to be out of bed.   Chronic afib On baby aspirin. She cannot be on anticoagulation with gi bleed. Off coreg, rate currently controlled. Will need cardiology follow up. Not a candidate for watchman procedure.   Sore throat Post extubation, continue her throat losenges and add chloraseptic spray q4h prn x 1 week and monitor  Hematuria Persists, her UTI likely contributing to this. monitor cbc and clinically, if persists by end of this week, will need urology referral  Enterococcus UTI Complete keflex 250 mg tid on 07/29/16, hydration encouraged and monitor  CHF Monitor clinically, continue lasix current regimen and monitor weight and bmp. Off coreg with low bp reading in hospital. Has cardiology follow uo  Pulmonary nodule At right lung base of 3 mm, will need out patient follow up with ct chest in 1 year  ckd stage 3 Monitor bmp  Protein calorie malnutrition Get RD to evaluate, monitor weight  Anemia of chronic disease Monitor cbc, continue feso4 325 mg bid  Complete heart block Stable, monitor. Normal PPM function.   PMR Continue prednisone 5 mg daily  gerd Stable with nexium 40 mg daily, monitor  Chronic constipation Continue miralax daily. Change senokot to daily from prn. Add prune juice and dulcolax suppository qod prn  HLD Continue her rosuvastatin   Goals of care: short term rehabilitation   Labs/tests ordered: cbc, cmp  Family/ staff Communication: reviewed care plan with patient and nursing supervisor    Blanchie Serve, MD Internal Medicine [place], [place] [address] Cell Phone (Monday-Friday 8 am - 5 pm): [phone] On Call: [phone] and follow prompts after 5 pm  and on weekends Office Phone: [phone] Office Fax: [phone]

## 2016-07-30 ENCOUNTER — Non-Acute Institutional Stay (SKILLED_NURSING_FACILITY): Payer: Medicare Other | Admitting: Family

## 2016-07-30 ENCOUNTER — Encounter: Payer: Self-pay | Admitting: Family

## 2016-07-30 DIAGNOSIS — R319 Hematuria, unspecified: Secondary | ICD-10-CM | POA: Diagnosis not present

## 2016-07-30 NOTE — Progress Notes (Signed)
Patient ID: [name], female   DOB: [birth year], 70 y.o.   MRN: [id number]  Location:   [place] Room Number: 907 P Place of Service:  SNF [phone]) Provider:  Evangelynn Lochridge FNP-C   Steve Rattler, DO  Patient Care Team: Steve Rattler, DO as PCP - General [name], MD (Gastroenterology) Thompson Grayer, MD (Cardiology)  Extended Emergency Contact Information Primary Emergency Contact: McNair,Veronica Address:            [place], [place] [address] [place] Phone: [phone] Relation: Daughter Secondary Emergency Contact: Bary Castilla Address: [address]          [place], [place] [address] [place] Mobile Phone: [phone] Relation: Daughter  Code Status: Full code  Goals of care: Advanced Directive information Advanced Directives 07/30/2016  Does patient have an advance directive? No  Would patient like information on creating an advanced directive? No - patient declined information  Pre-existing out of facility DNR order (yellow form or pink MOST form) -     Chief Complaint  Patient presents with  . Acute Visit    blood in the urine     HPI:  Pt is a 70 y.o. female seen today at [facility] for an acute visit for evaluation of bloody urine. She is seen in her room today. Facility staff reports patient passing blood when she voids. She is here short term rehabilitation post hospital admission from 07/10/16-07/26/16 with acute respiratory failure requiring ventilator support following a TEE procedure for her chronic afib to evaluate left atrial appendage and place watchman procedure where she became hypotensive and went into respiratory distress. TEE on 07/10/16 showed hypokinesis with EF 20-25%. She required pressor support and while on mechanical ventilator developed vocal cord edema and could not be extubated. She finally got extubated on 07/21/16 and was then placed on o2 by  nasal canula. During this admission, she developed hematuria thought to be from her foley catheter induced trauma and enterococcus UTI. Though she had a recurrent of hematuria during hospital adimission and was discharged on Keflex. Urology referral recommended if Hematuria recurred. Of note she has completed her Keflex antibiotic one day ago 07/29/2016 for recent UTI with hematuria.She denies any abdominal pain, urgency, burning or pain.   Past Medical History:  Diagnosis Date  . Anemia   . Angina   . Angioedema    2/2 ACE  . Arteriovenous malformation of stomach   . Arthritis   . Asthma   . AVM (arteriovenous malformation) of colon    small intestine; stomach  . Blood transfusion   . CHF (congestive heart failure) ([place])   . Complete heart block ([place])    s/p PPM 1998  . DDD (degenerative disc disease)   . Depression   . Diastolic heart failure   . Fatty liver 07/26/10  . GERD (gastroesophageal reflux disease)   . GI bleed   . History of alcohol abuse Stopped Fall 2012  . History of tobacco use Quit Fall 2012  . Hx of cardiovascular stress test    a. Lexiscan Myoview (10/15):  Small inferolateral and apical defect c/w scar and poss soft tissue attenuation, no ischemia, EF 42%  . Hx of colonic polyp 08/13/10   adenomatous  . Hx of colonoscopy   . Hyperlipidemia   . Hyperlipidemia   . Hypertension   . Hypertrophic cardiomyopathy ([place])    dx by Dr [name]  2009  . Iron deficiency anemia   . Panic attack   . Panic attacks   . Permanent atrial fibrillation ([place])   . Renal failure    baseline creatinine 1.6  . Right arm pain 01/08/2012  . RLS (restless legs syndrome)    Dx 06/2007  . Shortness of breath    sob on exertation  . Sleep apnea    Past Surgical History:  Procedure Laterality Date  . CARDIAC CATHETERIZATION    . ESOPHAGOGASTRODUODENOSCOPY  12/23/2011   Procedure: ESOPHAGOGASTRODUODENOSCOPY (EGD);  Surgeon: [name], MD;  Location: [facility];  Service:  Endoscopy;  Laterality: N/A;  . EXTUBATION (ENDOTRACHEAL) IN OR N/A 07/21/2016   Procedure: EXTUBATION (ENDOTRACHEAL) IN OR;  Surgeon: [name], MD;  Location: [facility];  Service: ENT;  Laterality: N/A;  . GIVENS CAPSULE STUDY  12/23/2011   Procedure: GIVENS CAPSULE STUDY;  Surgeon: [name], MD;  Location: [facility];  Service: Endoscopy;  Laterality: N/A;  . PACEMAKER INSERTION  1998   st jude, most recent gen change by [name] 4/12  . TUBAL LIGATION  04/01/2000    Allergies  Allergen Reactions  . Ace Inhibitors Other (See Comments)    Angioedema  . Propofol Anaphylaxis and Swelling      Medication List       Accurate as of 07/30/16  3:36 PM. Always use your most recent med list.          acetaminophen 325 MG tablet Commonly known as:  TYLENOL Take 325 mg by mouth daily as needed (pain).   ALPRAZolam 0.5 MG tablet Commonly known as:  XANAX Take 1 tablet (0.5 mg total) by mouth at bedtime as needed for anxiety.   aspirin EC 81 MG tablet Take 81 mg by mouth daily.   Coenzyme Q-10 200 MG Caps Take 1 capsule by mouth as directed.   esomeprazole 40 MG capsule Commonly known as:  NEXIUM Take 40 mg by mouth daily.   ferrous sulfate 325 (65 FE) MG tablet TAKE 1 TABLET BY MOUTH TWICE DAILY WITH MEALS TO KEEP BLOOD COUNT UP   furosemide 20 MG tablet Commonly known as:  LASIX Take 1 tablet by mouth on Tues, Thurs, Sat and Sun   multivitamin capsule Take 1 capsule by mouth daily.   nitroGLYCERIN 0.4 MG SL tablet Commonly known as:  NITROSTAT Place 1 tablet (0.4 mg total) under the tongue every 5 (five) minutes as needed for chest pain.   polyethylene glycol packet Commonly known as:  MIRALAX / GLYCOLAX Take 17 g by mouth daily.   predniSONE 1 MG tablet Commonly known as:  DELTASONE Take 5 tablets by mouth daily with breakfast   PROAIR HFA 108 (90 Base) MCG/ACT inhaler Generic drug:  albuterol INHALE 2 PUFFS INTO THE LUNGS EVERY 6 HOURS AS NEEDED FOR WHEEZING  OR SHORTNESS OF BREATH   PROSTAT PO Take 30 mLs by mouth daily.   rosuvastatin 20 MG tablet Commonly known as:  CRESTOR TAKE 1 TABLET BY MOUTH DAILY ON MONDAY, WEDNESDAY, AND FRIDAY   senna 8.6 MG tablet Commonly known as:  SENOKOT Take 1 tablet by mouth daily as needed for constipation.       Review of Systems  Constitutional: Negative for activity change, appetite change, chills, fatigue and fever.  HENT: Negative for congestion, rhinorrhea, sinus pressure, sneezing and sore throat.   Respiratory: Negative for cough, chest tightness and wheezing.   Cardiovascular: Negative for chest pain, palpitations and leg swelling.  Gastrointestinal:  Negative for abdominal distention, abdominal pain, blood in stool, constipation, diarrhea, nausea and vomiting.  Genitourinary: Positive for hematuria. Negative for difficulty urinating, dysuria, flank pain, frequency, urgency, vaginal bleeding, vaginal discharge and vaginal pain.  Musculoskeletal: Positive for gait problem.  Skin: Negative for color change, pallor and rash.  Neurological: Negative for dizziness, seizures, syncope, light-headedness and headaches.  Hematological: Does not bruise/bleed easily.  Psychiatric/Behavioral: Negative for agitation, confusion, hallucinations and sleep disturbance. The patient is not nervous/anxious.     Immunization History  Administered Date(s) Administered  . Influenza Split 01/08/2012  . Influenza,inj,Quad PF,36+ Mos 11/26/2015  . PPD Test 07/26/2016  . Pneumococcal Conjugate-13 04/27/2014  . Pneumococcal Polysaccharide-23 12/23/2010  . Td 03/15/2004   Pertinent  Health Maintenance Due  Topic Date Due  . LIPID PANEL  03/27/2016  . INFLUENZA VACCINE  07/15/2016  . MAMMOGRAM  04/11/2018  . COLONOSCOPY  08/24/2021  . DEXA SCAN  Completed  . PNA vac Low Risk Adult  Completed   Fall Risk  12/31/2015 10/05/2015 08/29/2015 06/06/2015 04/24/2015  Falls in the past year? No No No No No      Vitals:    07/30/16 1249  BP: 111/68  Pulse: 71  Resp: 18  Temp: 97.8 F (36.6 C)  TempSrc: Oral  SpO2: 97%  Weight: 168 lb 11.2 oz (76.5 kg)  Height: 5\' 5"  (1.651 m)   Body mass index is 28.07 kg/m. Physical Exam  Constitutional: She is oriented to person, place, and time. She appears well-developed and well-nourished. No distress.  HENT:  Head: Normocephalic.  Mouth/Throat: Oropharynx is clear and moist. No oropharyngeal exudate.  Eyes: Conjunctivae and EOM are normal. Pupils are equal, round, and reactive to light. Right eye exhibits no discharge. Left eye exhibits no discharge. No scleral icterus.  Neck: Normal range of motion. No JVD present. No thyromegaly present.  Cardiovascular: Normal rate, regular rhythm, normal heart sounds and intact distal pulses.  Exam reveals no gallop and no friction rub.   No murmur heard. Pulmonary/Chest: Effort normal and breath sounds normal. No respiratory distress. She has no wheezes. She has no rales.  Abdominal: Soft. Bowel sounds are normal. She exhibits no distension. There is no tenderness. There is no rebound and no guarding.  Genitourinary: No vaginal discharge found.  Musculoskeletal: She exhibits no edema, tenderness or deformity.  Bilateral lower extremities weakness   Lymphadenopathy:    She has no cervical adenopathy.  Neurological: She is oriented to person, place, and time.  Skin: Skin is warm and dry. No rash noted. No erythema. No pallor.  Psychiatric: She has a normal mood and affect.    Labs reviewed:  Recent Labs  07/20/16 0330 07/21/16 0249 07/22/16 0320  07/24/16 0340 07/25/16 0310 07/26/16 0729  NA 145 143 139  < > 134* 137 137  K 5.0 4.9 4.1  < > 4.0 3.7 3.9  CL 106 105 101  < > 101 101 104  CO2 33* 32 31  < > 25 25 23   GLUCOSE 101* 97 114*  < > 98 106* 111*  BUN 70* 60* 50*  < > 41* 34* 21*  CREATININE 1.16* 1.16* 1.13*  < > 1.27* 1.23* 1.17*  CALCIUM 10.0 9.9 9.9  < > 8.9 9.1 9.2  MG 2.4 2.3 2.2  --   --   --    --   PHOS 4.3 3.1 4.2  --   --   --   --   < > = values in  this interval not displayed.  Recent Labs  02/05/16 0749 02/06/16 0757 07/20/16 0330 07/21/16 0249  AST 23 20  --   --   ALT 12* 11*  --   --   ALKPHOS 38 33*  --   --   BILITOT 0.5 0.2*  --   --   PROT 7.1 6.1*  --   --   ALBUMIN 3.5 3.0* 2.5* 2.4*    Recent Labs  07/20/16 0330 07/21/16 0249 07/23/16 0404 07/24/16 0330 07/25/16 0310 07/26/16 0729  WBC 7.3 8.0 7.0 4.9 5.1 4.5  NEUTROABS 5.2 6.1 5.4  --   --   --   HGB 9.4* 9.3* 10.2* 9.8* 9.5* 9.4*  HCT 32.5* 32.0* 34.2* 32.8* 31.3* 31.8*  MCV 87.8 87.9 87.7 85.4 85.1 85.0  PLT 270 270 269 288 283 292   Lab Results  Component Value Date   TSH 0.76 04/05/2015   Lab Results  Component Value Date   HGBA1C 6.0 11/25/2013   Lab Results  Component Value Date   CHOL 122 03/28/2015   HDL 42 (L) 03/28/2015   LDLCALC 63 03/28/2015   LDLDIRECT 161.8 02/21/2013   TRIG 86 03/28/2015   CHOLHDL 2.9 03/28/2015    Significant Diagnostic Results in last 30 days:  Ct Abdomen Pelvis Wo Contrast  Result Date: 07/25/2016 CLINICAL DATA:  70 year old with new onset of hematuria. EXAM: CT ABDOMEN AND PELVIS WITHOUT CONTRAST TECHNIQUE: Multidetector CT imaging of the abdomen and pelvis was performed following the standard protocol without IV contrast. COMPARISON:  None. FINDINGS: Lower chest: Focal volume loss at the lingula based. Cardiac leads extending into the right atrium and right ventricle. 3 mm nodule at the right lung base on sequence 205, image 12. Hepatobiliary: There is motion artifact along the inferior aspect of the liver and the gallbladder. No gross abnormality to the liver or gallbladder. Pancreas: Normal appearance of the pancreas without inflammation or duct dilatation. Spleen: Normal appearance of spleen without enlargement. Adrenals/Urinary Tract: Normal appearance of the adrenal glands. At least 2 cysts in the left kidney upper pole. There may be an  additional cyst in the left kidney interpolar region. Small exophytic area along the left kidney lower pole may also represent a cyst but too small to definitively characterize. Probable cyst in the right kidney upper pole. Probable exophytic cyst in the right kidney interpolar region. Evidence for exophytic cyst in the right kidney lower pole region. No evidence for hydronephrosis or stones. Normal appearance of the urinary bladder. Stomach/Bowel: No acute abnormality in the bowel structures. No evidence for obstruction. Normal appearance of the appendix. Vascular/Lymphatic: Atherosclerotic calcifications in the aorta and iliac arteries without aneurysm. No suspicious lymphadenopathy. Reproductive: No gross abnormality to the uterus or adnexal structures. Other: There may be trace fluid in the pelvis.  No free air. Musculoskeletal: Disc space narrowing at L5-S1. Subchondral sclerosis involving the superior left femoral head. Findings are suggestive for AVN. No evidence for femoral head collapse. IMPRESSION: No CT findings to explain hematuria. Negative for kidney stones or hydronephrosis. Multiple exophytic and low-density structures in both kidneys. These probably represent cysts but many are too small to definitively characterize, especially without contrast. Left femoral head AVN without collapse. 3 mm nodule at the right lung base is nonspecific. No follow-up needed if patient is low-risk. Non-contrast chest CT can be considered in 12 months if patient is high-risk. This recommendation follows the consensus statement: Guidelines for Management of Incidental Pulmonary Nodules Detected on CT Images:From the  Fleischner Society 2017; published online before print (10.1148/[id number].IJ:[id number]). Electronically Signed   By: Markus Daft M.D.   On: 07/25/2016 16:06   Ct Head Wo Contrast  Result Date: 07/15/2016 CLINICAL DATA:  Follow-up evaluation. RIGHT arm weakness. History of chronic renal failure, hyperlipidemia,  hypertension. EXAM: CT HEAD WITHOUT CONTRAST TECHNIQUE: Contiguous axial images were obtained from the base of the skull through the vertex without intravenous contrast. COMPARISON:  CT HEAD July 13, 2016 in CT HEAD October 05, 2011. FINDINGS: INTRACRANIAL CONTENTS: The ventricles and sulci are normal for age. No intraparenchymal hemorrhage, mass effect nor midline shift. Patchy supratentorial white matter hypodensities are less than expected for patient's age and though non-specific likely represent chronic small vessel ischemic disease. No acute large vascular territory infarcts. No abnormal extra-axial fluid collections. Basal cisterns are patent. Mild calcific atherosclerosis of the carotid siphons. ORBITS: The included ocular globes and orbital contents are non-suspicious. SINUSES: Lobulated maxillary sinusitis without antral expansion. Minimal LEFT mastoid effusion. SKULL/SOFT TISSUES: No skull fracture. No significant soft tissue swelling. Life-support lines in place. Stable 7 mm sclerotic focus LEFT clivus. IMPRESSION: Negative CT HEAD for age. Electronically Signed   By: Elon Alas M.D.   On: 07/15/2016 02:06   Ct Head Wo Contrast  Result Date: 07/13/2016 CLINICAL DATA:  Increased confusion, post respiratory arrest. EXAM: CT HEAD WITHOUT CONTRAST TECHNIQUE: Contiguous axial images were obtained from the base of the skull through the vertex without intravenous contrast. COMPARISON:  07/10/2016. FINDINGS: Brain: No evidence of acute infarction, hemorrhage, extra-axial collection, ventriculomegaly, or mass effect. Mild brain parenchymal volume loss and periventricular microangiopathy. The gray-white matter differentiation is preserved. Vascular: No hyperdense vessel or unexpected calcification. Skull: Negative for fracture or focal lesion. Sinuses/Orbits: Near complete opacification of the bilateral maxillary sinuses. Other: None. IMPRESSION: No acute intracranial abnormality. No current evidence of  anoxic injury to the brain. Mild brain parenchymal atrophy and chronic microvascular disease. Near complete opacification of bilateral maxillary sinuses suggestive of chronic sinusitis. Electronically Signed   By: Fidela Salisbury M.D.   On: 07/13/2016 11:41  Ct Head Wo Contrast  Result Date: 07/10/2016 CLINICAL DATA:  Altered mental status. Transesophageal echocardiogram today with difficult intubation and hypotension EXAM: CT HEAD WITHOUT CONTRAST TECHNIQUE: Contiguous axial images were obtained from the base of the skull through the vertex without intravenous contrast. COMPARISON:  CT head 10/05/2011 FINDINGS: Ventricle size normal.  Cerebral volume normal. Negative for acute infarct. Negative for hemorrhage or mass. No fluid collection or shift of the midline structures. Mucosal edema throughout the paranasal sinuses. Negative calvarium IMPRESSION: No acute intracranial abnormality. If there is concern of anoxic injury, follow-up CT may be helpful as ischemia may be missed on early CT. Electronically Signed   By: Franchot Gallo M.D.   On: 07/10/2016 15:42  Ct Soft Tissue Neck Wo Contrast  Result Date: 07/10/2016 CLINICAL DATA:  Transesophageal echo cardiogram today with difficult intubation. Hypotension. Rule out esophageal injury. EXAM: CT NECK WITHOUT CONTRAST TECHNIQUE: Multidetector CT imaging of the neck was performed following the standard protocol without intravenous contrast. COMPARISON:  None. FINDINGS: Pharynx and larynx: The patient is intubated. Pharyngeal soft tissues are collapsed around endotracheal tube and there may be some pharyngeal edema. Endotracheal tube in good position in the trachea above the carina. No focal hematoma. No soft tissue gas is seen that would suggests pharyngeal or esophageal perforation. No mass-effect or shift of the midline structures. Salivary glands: Parotid and submandibular glands normal bilaterally. Thyroid: Negative Lymph nodes: Negative for  cervical  lymphadenopathy. Vascular: Patency of vascular structures not assessed without intravenous contrast. Limited intracranial: Negative Visualized orbits: Negative Mastoids and visualized paranasal sinuses: Extensive mucosal edema throughout the paranasal sinuses bilaterally with near complete opacification of the maxillary sinus bilaterally Skeleton: Mild degenerative changes in the cervical spine. No acute skeletal abnormality. Dental disease. Upper chest: Mild atelectasis in the right upper lobe.  No effusion. IMPRESSION: Endotracheal tube in good position. The pharynx is collapsed around endotracheal tube and there may be some pharyngeal edema. No hematoma. No gas in the soft tissues of the pharynx or proximal esophagus that would suggest acute injury. Negative for mass or adenopathy Extensive mucosal edema paranasal sinuses Right upper lobe atelectasis. Electronically Signed   By: Franchot Gallo M.D.   On: 07/10/2016 15:40  Ct Soft Tissue Neck W Contrast  Result Date: 07/17/2016 CLINICAL DATA:  Stridor. Evaluate pharyngeal edema. History of renal failure, heart failure EXAM: CT NECK WITH CONTRAST TECHNIQUE: Multidetector CT imaging of the neck was performed using the standard protocol following the bolus administration of intravenous contrast. CONTRAST:  85mL ISOVUE-300 IOPAMIDOL (ISOVUE-300) INJECTION 61% COMPARISON:  CT neck July 10, 2016 FINDINGS: Pharynx and larynx: Similarly effaced at the level of the oral pharynx, limited assessment due tube placement of life-support lines. Decreased pharyngeal edema. Preservation of parapharyngeal fat tissue planes. Salivary glands: Normal. Thyroid: Normal. Lymph nodes: No lymphadenopathy by CT size criteria. Vascular: Normal though not tailored for evaluation. Trace calcific atherosclerosis RIGHT carotid bulb. Limited intracranial: Normal. Visualized orbits: Normal. Mastoids and visualized paranasal sinuses: Pan paranasal sinusitis, near complete of fat pace min of  maxillary sinuses. Small LEFT mastoid effusion. Skeleton: No destructive bony lesions. Moderate to severe C5-6 disc height loss, uncovertebral hypertrophy compatible with degenerative disc. RIGHT greater than LEFT mandible molar periapical abscess. Upper chest: Lung apices are clear. No superior mediastinal lymphadenopathy. Endotracheal tube tip above the carina. Nasogastric tube in place, distal tip not imaged. IMPRESSION: Life-support lines remain in place. Airway effacement, decreased pharyngeal edema. Electronically Signed   By: Elon Alas M.D.   On: 07/17/2016 03:49   Dg Chest Port 1 View  Result Date: 07/19/2016 CLINICAL DATA:  Acute respiratory failure EXAM: PORTABLE CHEST 1 VIEW COMPARISON:  07/18/2016 FINDINGS: Endotracheal tube and NG tube unchanged. RIGHT-sided pacemaker overlies the mild enlarged cardiac silhouette. There is LEFT basilar atelectasis. Mild central venous congestion. No pulmonary edema. No pneumothorax. IMPRESSION: 1. Stable support apparatus. 2. Mild central venous congestion and LEFT basilar atelectasis. Electronically Signed   By: Suzy Bouchard M.D.   On: 07/19/2016 07:14   Dg Chest Port 1 View  Result Date: 07/18/2016 CLINICAL DATA:  Acute renal failure, respiratory failure, intubated patient, CHF. EXAM: PORTABLE CHEST 1 VIEW COMPARISON:  Portable chest x-ray of July 17, 2016 FINDINGS: The patient is rotated on today's study. The lungs are well-expanded. The interstitial markings are coarse. The infrahilar lung markings on the left remain increased. The cardiac silhouette is enlarged. A prosthetic valve ring in what appears to be the mitral position is demonstrated. The pulmonary vascularity is not clearly engorged. There is no pleural effusion. The endotracheal tube tip lies 5.5 cm above the carina. The esophagogastric tube tip projects below the inferior margin of the image. The electrodes of the permanent pacemaker appear to be in stable position. IMPRESSION:  Limited study due to persistent rotation toward the right. There is infrahilar infiltrate or atelectasis on the left not greatly changed. Cardiomegaly without pulmonary vascular congestion. No pleural effusion. The support tubes  are in stable position. Electronically Signed   By: David  [name] M.D.   On: 07/18/2016 07:11   Dg Chest Port 1 View  Result Date: 07/17/2016 CLINICAL DATA:  Acute respiratory failure, chronic atrial fibrillation chronic renal insufficiency, COPD, former smoker. EXAM: PORTABLE CHEST 1 VIEW COMPARISON:  Portable chest x-ray of July 16, 2016 FINDINGS: The patient is rotated on this study. The lungs are reasonably well inflated. There is persistent increased density in the left infrahilar region and left lower lobe. The cardiac silhouette remains enlarged. The pulmonary vascularity is prominent centrally. There is no significant pleural effusion and no pneumothorax. The permanent pacemaker is in stable position. The endotracheal tube tip lies 4.8 cm above the carina. The esophagogastric tube tip projects below the inferior margin of the image. There are degenerative changes of the left shoulder. IMPRESSION: Persistent left lower lobe atelectasis or pneumonia. Stable cardiomegaly without significant pulmonary vascular congestion. The support tubes are in reasonable position Electronically Signed   By: David  [name] M.D.   On: 07/17/2016 07:52   Dg Chest Port 1 View  Result Date: 07/16/2016 CLINICAL DATA:  Acute respiratory failure, history of asthma, CHF. EXAM: PORTABLE CHEST 1 VIEW COMPARISON:  Portable chest x-ray of July 15, 2016 FINDINGS: The lungs are well-expanded. The interstitial markings are mildly prominent bilaterally. The cardiac silhouette is enlarged. The central pulmonary vascularity is engorged. Minimal pleural fluid at the left lung base is suspected. There is calcification in the wall of the aortic arch. The endotracheal tube tip lies 4.9 cm above the carina. The  esophagogastric tube tip projects below the inferior margin of the image. The permanent pacemaker electrodes appear to be in stable position where visualized. IMPRESSION: Slight overall increase in conspicuity of the pulmonary interstitium suggests low-grade interstitial edema stable cardiomegaly. Minimal left basilar atelectasis and/or trace of pleural fluid. The support tubes are in reasonable position. Aortic atherosclerosis. Electronically Signed   By: David  [name] M.D.   On: 07/16/2016 07:20   Dg Chest Port 1 View  Result Date: 07/15/2016 CLINICAL DATA:  Evaluate placement of endotracheal tube. EXAM: PORTABLE CHEST 1 VIEW COMPARISON:  07/15/2016 at 5:08 a.m. FINDINGS: Endotracheal tube tip projects 2.6 cm above the carina. Oral/nasogastric tube passes below the diaphragm well into the stomach. Cardiac silhouette is enlarged. There is lung base opacity, left greater than right, most likely atelectasis. No convincing pulmonary edema. No pneumothorax. IMPRESSION: 1. Endotracheal tube tip now projects 2.6 cm above the carina. 2. Mild left greater than right basilar atelectasis is stable the prior exam. No new lung opacities. Electronically Signed   By: Lajean Manes M.D.   On: 07/15/2016 16:53   Dg Chest Port 1 View  Result Date: 07/15/2016 CLINICAL DATA:  Acute respiratory failure. EXAM: PORTABLE CHEST 1 VIEW COMPARISON:  07/14/2016 FINDINGS: Endotracheal tube terminates at the clavicular heads, unchanged. Enteric tube courses into the left upper abdomen with tip not imaged. Cardiac silhouette remains enlarged. Lung volumes are mildly diminished with new mild left basilar opacity which likely reflects atelectasis. There is minimal right basilar atelectasis which is slightly improved. No sizable pleural effusion or pneumothorax is identified. IMPRESSION: Bibasilar atelectasis. Electronically Signed   By: Logan Bores M.D.   On: 07/15/2016 07:32   Dg Chest Port 1 View  Result Date: 07/14/2016 CLINICAL  DATA:  Hypoxia EXAM: PORTABLE CHEST 1 VIEW COMPARISON:  July 13, 2016 FINDINGS: Endotracheal tube tip is 4.3 cm above the carina. Nasogastric tube tip and side port are below the  diaphragm. Pacemaker leads are attached to right atrium and right ventricle. No pneumothorax. There is slight right base atelectasis. Lungs elsewhere are clear. There is cardiomegaly with pulmonary vascularity within normal limits. No adenopathy. There is atherosclerotic calcification in the aortic arch. IMPRESSION: Tube positions as described without pneumothorax. Stable cardiomegaly. Aortic atherosclerosis. Mild right base atelectasis. No new opacity. Electronically Signed   By: Lowella Grip III M.D.   On: 07/14/2016 07:22  Dg Chest Port 1 View  Result Date: 07/13/2016 CLINICAL DATA:  Acute respiratory failure EXAM: PORTABLE CHEST 1 VIEW COMPARISON:  07/11/2016 FINDINGS: Right pacer remains in place, unchanged. Endotracheal tube and NG tube are unchanged. There is cardiomegaly. Improving aeration in the bases with decreasing atelectasis. Minimal residual right base atelectasis. No effusions or edema. No acute bony abnormality. IMPRESSION: Cardiomegaly. Minimal residual right base atelectasis. Electronically Signed   By: Rolm Baptise M.D.   On: 07/13/2016 07:14  Dg Chest Port 1 View  Result Date: 07/11/2016 CLINICAL DATA:  Respiratory failure. EXAM: PORTABLE CHEST 1 VIEW COMPARISON:  07/10/2016. FINDINGS: Endotracheal tube, NG tube in stable position. Cardiac pacer in stable position. Stable cardiomegaly. Low lung volumes with mild bibasilar subsegmental atelectasis and or infiltrates/edema. No pleural effusion or pneumothorax. IMPRESSION: 1.  Lines and tubes in stable position. 2. Low lung volumes with mild bibasilar atelectasis and/or infiltrates/edema. 3. Cardiac pacer stable position.  Stable cardiomegaly. Electronically Signed   By: Marcello Moores  Register   On: 07/11/2016 06:59  Dg Chest Portable 1 View  Result Date:  07/10/2016 CLINICAL DATA:  Endotracheal tube and OG tube placements. EXAM: PORTABLE CHEST 1 VIEW COMPARISON:  Chest x-ray dated 02/04/2016. FINDINGS: Endotracheal tube is well positioned with tip approximately 3 cm above the carina. Enteric tube passes below the diaphragm. Cardiomediastinal silhouette is stable in size and configuration. Mild interstitial edema again noted bilaterally. No pleural effusion or pneumothorax seen. Right chest wall pacemaker in place. Osseous and soft tissue structures about the chest otherwise unremarkable. IMPRESSION: Endotracheal tube well positioned with tip approximately 3 cm above the carina. OG tube passes below the diaphragm. Distal tip of the OG tube is below the lower limits of this chest x-ray. Cardiomegaly and interstitial edema suggesting mild CHF/volume overload. Electronically Signed   By: Franki Cabot M.D.   On: 07/10/2016 16:05   Assessment/Plan Hematuria  Afebrile. Recurrent. Has completed Antibiotics. Refer to Urology for evaluation of recurrent Hematuria per hospital discharge recommendation.    Family/ staff Communication: Reviewed plan of care with patient and facility Nurse supervisor.  Labs/tests ordered:  CBC/diff 08/04/2016

## 2016-08-01 DIAGNOSIS — R310 Gross hematuria: Secondary | ICD-10-CM | POA: Diagnosis not present

## 2016-08-08 ENCOUNTER — Telehealth: Payer: Self-pay | Admitting: Internal Medicine

## 2016-08-08 ENCOUNTER — Non-Acute Institutional Stay (SKILLED_NURSING_FACILITY): Payer: Medicare Other | Admitting: Family

## 2016-08-08 DIAGNOSIS — I482 Chronic atrial fibrillation, unspecified: Secondary | ICD-10-CM

## 2016-08-08 DIAGNOSIS — I10 Essential (primary) hypertension: Secondary | ICD-10-CM

## 2016-08-08 DIAGNOSIS — I5030 Unspecified diastolic (congestive) heart failure: Secondary | ICD-10-CM | POA: Diagnosis not present

## 2016-08-08 DIAGNOSIS — R600 Localized edema: Secondary | ICD-10-CM

## 2016-08-08 NOTE — Telephone Encounter (Signed)
Calling stating she was d/c on 8/12 and is now at [facility].  She was calling to find out why her BP medication Coreg was stopped.  Advised discharge note from Dr. Domenic Polite, [facility] states coreg was d/c while she was in ICU.  She states her BP has been running a little high according to her nurse.  She does not know the readings.  Advised her to tell the nurse to call our office with several days of the BP readings. She will advise nurse.

## 2016-08-08 NOTE — Progress Notes (Addendum)
Location:   [place] Room Number: 907 Place of Service:  SNF [phone]) Provider: Valerya Maxton FNP-C   Steve Rattler, DO  Patient Care Team: Steve Rattler, DO as PCP - General [name], MD (Gastroenterology) Thompson Grayer, MD (Cardiology)  Extended Emergency Contact Information Primary Emergency Contact: McNair,Veronica Address: [place], [place] [address] [place] Phone: [phone] Relation: Daughter Secondary Emergency Contact: Bary Castilla Address: [address]          [place], [place] [address] [place] Mobile Phone: [phone] Relation: Daughter  Code Status: Full Code  Goals of care: Advanced Directive information Advanced Directives 07/30/2016  Does patient have an advance directive? No  Would patient like information on creating an advanced directive? No - patient declined information  Pre-existing out of facility DNR order (yellow form or pink MOST form) -     Chief Complaint  Patient presents with  . Acute Visit    Blood pressure Medication concerns     HPI:  Pt is a 70 y.o. female seen today at [facility] for an acute visit for evaluation of blood pressure medication. She is seen in her room today. She states concerned about her Coreg medication which she was taking at home but was discontinued during her recent hospital admission  from 07/10/16-07/26/16 with acute respiratory failure requiring ventilator support following a TEE procedure for her chronic afib to evaluate left atrial appendage and place watchman procedure where she became hypotensive and went into respiratory distress. TEE on 07/10/16 showed hypokinesis with EF 20-25%. As per discharge recommendation by Dr. Domenic Polite coreg discontinued while in the ICU. Patient's current SBp ranging in the 110's- 130's with X 1 episode in the 140's on 08/07/2016. HR ranging in the 70's-80's. Patient  states called her  Cardiologist Dr. Lemmie Evens to inquire why her blood pressure medication was D/Ced recommended facility Nurse to contact office with B/P readings for several days.    Past Medical History:  Diagnosis Date  . Anemia   . Angina   . Angioedema    2/2 ACE  . Arteriovenous malformation of stomach   . Arthritis   . Asthma   . AVM (arteriovenous malformation) of colon    small intestine; stomach  . Blood transfusion   . CHF (congestive heart failure) ([place])   . Complete heart block ([place])    s/p PPM 1998  . DDD (degenerative disc disease)   . Depression   . Diastolic heart failure   . Fatty liver 07/26/10  . GERD (gastroesophageal reflux disease)   . GI bleed   . History of alcohol abuse Stopped Fall 2012  . History of tobacco use Quit Fall 2012  . Hx of cardiovascular stress test    a. Lexiscan Myoview (10/15):  Small inferolateral and apical defect c/w scar and poss soft tissue attenuation, no ischemia, EF 42%  . Hx of colonic polyp 08/13/10   adenomatous  . Hx of colonoscopy   . Hyperlipidemia   . Hyperlipidemia   . Hypertension   . Hypertrophic cardiomyopathy ([place])    dx by Dr [name] 2009  . Iron deficiency anemia   . Panic attack   . Panic attacks   . Permanent atrial fibrillation ([place])   . Renal failure    baseline creatinine 1.6  . Right arm pain 01/08/2012  .  RLS (restless legs syndrome)    Dx 06/2007  . Shortness of breath    sob on exertation  . Sleep apnea    Past Surgical History:  Procedure Laterality Date  . CARDIAC CATHETERIZATION    . ESOPHAGOGASTRODUODENOSCOPY  12/23/2011   Procedure: ESOPHAGOGASTRODUODENOSCOPY (EGD);  Surgeon: [name], MD;  Location: [facility];  Service: Endoscopy;  Laterality: N/A;  . EXTUBATION (ENDOTRACHEAL) IN OR N/A 07/21/2016   Procedure: EXTUBATION (ENDOTRACHEAL) IN OR;  Surgeon: [name], MD;  Location: [facility];  Service: ENT;  Laterality: N/A;  . GIVENS CAPSULE STUDY  12/23/2011   Procedure: GIVENS  CAPSULE STUDY;  Surgeon: [name], MD;  Location: [facility];  Service: Endoscopy;  Laterality: N/A;  . PACEMAKER INSERTION  1998   st jude, most recent gen change by [name] 4/12  . TUBAL LIGATION  04/01/2000    Allergies  Allergen Reactions  . Ace Inhibitors Other (See Comments)    Angioedema  . Propofol Anaphylaxis and Swelling      Medication List       Accurate as of 08/08/16  7:03 PM. Always use your most recent med list.          acetaminophen 325 MG tablet Commonly known as:  TYLENOL Take 325 mg by mouth daily as needed (pain).   ALPRAZolam 0.5 MG tablet Commonly known as:  XANAX Take 1 tablet (0.5 mg total) by mouth at bedtime as needed for anxiety.   aspirin EC 81 MG tablet Take 81 mg by mouth daily.   Coenzyme Q-10 200 MG Caps Take 1 capsule by mouth as directed.   esomeprazole 40 MG capsule Commonly known as:  NEXIUM Take 40 mg by mouth daily.   ferrous sulfate 325 (65 FE) MG tablet TAKE 1 TABLET BY MOUTH TWICE DAILY WITH MEALS TO KEEP BLOOD COUNT UP   furosemide 20 MG tablet Commonly known as:  LASIX Take 1 tablet by mouth on Tues, Thurs, Sat and Sun   multivitamin capsule Take 1 capsule by mouth daily.   nitroGLYCERIN 0.4 MG SL tablet Commonly known as:  NITROSTAT Place 1 tablet (0.4 mg total) under the tongue every 5 (five) minutes as needed for chest pain.   polyethylene glycol packet Commonly known as:  MIRALAX / GLYCOLAX Take 17 g by mouth daily.   predniSONE 1 MG tablet Commonly known as:  DELTASONE Take 5 tablets by mouth daily with breakfast   PROAIR HFA 108 (90 Base) MCG/ACT inhaler Generic drug:  albuterol INHALE 2 PUFFS INTO THE LUNGS EVERY 6 HOURS AS NEEDED FOR WHEEZING OR SHORTNESS OF BREATH   PROSTAT PO Take 30 mLs by mouth daily.   rosuvastatin 20 MG tablet Commonly known as:  CRESTOR TAKE 1 TABLET BY MOUTH DAILY ON MONDAY, WEDNESDAY, AND FRIDAY   senna 8.6 MG tablet Commonly known as:  SENOKOT Take 1 tablet by  mouth daily as needed for constipation.       Review of Systems  Constitutional: Negative for activity change, appetite change, chills, fatigue and fever.  HENT: Negative for congestion, rhinorrhea, sinus pressure, sneezing and sore throat.   Respiratory: Negative for cough, chest tightness and wheezing.        Shortness of breath with exertion   Cardiovascular: Negative for chest pain, palpitations and leg swelling.  Gastrointestinal: Negative for abdominal distention, abdominal pain, blood in stool, constipation, diarrhea, nausea and vomiting.  Genitourinary: Negative for difficulty urinating, dysuria, flank pain, frequency, urgency, vaginal bleeding, vaginal discharge and  vaginal pain.  Musculoskeletal: Positive for gait problem.  Skin: Negative for color change, pallor and rash.  Neurological: Negative for dizziness, seizures, syncope, light-headedness and headaches.  Hematological: Does not bruise/bleed easily.  Psychiatric/Behavioral: Negative for agitation, confusion, hallucinations and sleep disturbance. The patient is not nervous/anxious.     Immunization History  Administered Date(s) Administered  . Influenza Split 01/08/2012  . Influenza,inj,Quad PF,36+ Mos 11/26/2015  . PPD Test 07/26/2016  . Pneumococcal Conjugate-13 04/27/2014  . Pneumococcal Polysaccharide-23 12/23/2010  . Td 03/15/2004   Pertinent  Health Maintenance Due  Topic Date Due  . LIPID PANEL  03/27/2016  . INFLUENZA VACCINE  07/15/2016  . MAMMOGRAM  04/11/2018  . COLONOSCOPY  08/24/2021  . DEXA SCAN  Completed  . PNA vac Low Risk Adult  Completed   Fall Risk  12/31/2015 10/05/2015 08/29/2015 06/06/2015 04/24/2015  Falls in the past year? No No No No No      Vitals:   08/08/16 1858  BP: 132/64  Pulse: 86  Resp: 20  Temp: 98.3 F (36.8 C)  SpO2: 98%  Weight: 175 lb 12.8 oz (79.7 kg)  Height: 5\' 5"  (1.651 m)   Body mass index is 29.25 kg/m. Physical Exam  Constitutional: She is oriented to  person, place, and time. She appears well-developed and well-nourished. No distress.  HENT:  Head: Normocephalic.  Mouth/Throat: Oropharynx is clear and moist. No oropharyngeal exudate.  Eyes: Conjunctivae and EOM are normal. Pupils are equal, round, and reactive to light. Right eye exhibits no discharge. Left eye exhibits no discharge. No scleral icterus.  Neck: Normal range of motion. No JVD present. No thyromegaly present.  Cardiovascular: Normal rate, regular rhythm, normal heart sounds and intact distal pulses.  Exam reveals no gallop and no friction rub.   No murmur heard. Pulmonary/Chest: Effort normal and breath sounds normal. No respiratory distress. She has no wheezes. She has no rales.  Abdominal: Soft. Bowel sounds are normal. She exhibits no distension. There is no tenderness. There is no rebound and no guarding.  Genitourinary: No vaginal discharge found.  Musculoskeletal: She exhibits no edema, tenderness or deformity.  Bilateral lower extremities weakness. Bilateral Leg 1-2+ edema.    Lymphadenopathy:    She has no cervical adenopathy.  Neurological: She is oriented to person, place, and time.  Skin: Skin is warm and dry. No rash noted. No erythema. No pallor.  Psychiatric: She has a normal mood and affect.    Labs reviewed:  Recent Labs  07/20/16 0330 07/21/16 0249 07/22/16 0320  07/24/16 0340 07/25/16 0310 07/26/16 0729  NA 145 143 139  < > 134* 137 137  K 5.0 4.9 4.1  < > 4.0 3.7 3.9  CL 106 105 101  < > 101 101 104  CO2 33* 32 31  < > 25 25 23   GLUCOSE 101* 97 114*  < > 98 106* 111*  BUN 70* 60* 50*  < > 41* 34* 21*  CREATININE 1.16* 1.16* 1.13*  < > 1.27* 1.23* 1.17*  CALCIUM 10.0 9.9 9.9  < > 8.9 9.1 9.2  MG 2.4 2.3 2.2  --   --   --   --   PHOS 4.3 3.1 4.2  --   --   --   --   < > = values in this interval not displayed.  Recent Labs  02/05/16 0749 02/06/16 0757 07/20/16 0330 07/21/16 0249  AST 23 20  --   --   ALT 12* 11*  --   --  ALKPHOS 38  33*  --   --   BILITOT 0.5 0.2*  --   --   PROT 7.1 6.1*  --   --   ALBUMIN 3.5 3.0* 2.5* 2.4*    Recent Labs  07/20/16 0330 07/21/16 0249 07/23/16 0404 07/24/16 0330 07/25/16 0310 07/26/16 0729  WBC 7.3 8.0 7.0 4.9 5.1 4.5  NEUTROABS 5.2 6.1 5.4  --   --   --   HGB 9.4* 9.3* 10.2* 9.8* 9.5* 9.4*  HCT 32.5* 32.0* 34.2* 32.8* 31.3* 31.8*  MCV 87.8 87.9 87.7 85.4 85.1 85.0  PLT 270 270 269 288 283 292   Lab Results  Component Value Date   TSH 0.76 04/05/2015   Lab Results  Component Value Date   HGBA1C 6.0 11/25/2013   Lab Results  Component Value Date   CHOL 122 03/28/2015   HDL 42 (L) 03/28/2015   LDLCALC 63 03/28/2015   LDLDIRECT 161.8 02/21/2013   TRIG 86 03/28/2015   CHOLHDL 2.9 03/28/2015     Assessment/Plan 1. Hypertension SBP ranging in the 110's- 130's with X 1 episode in the 140's on 08/07/2016. HR ranging in the 70's-80's. Patient states called her  Cardiologist Dr. Lemmie Evens to inquire why her blood pressure medication was D/Ced recommended facility Nurse to contact office with B/P readings for several days. Monitor B/P and HR  Q shift X 5 days. Fax B/p readings to Cardiologist Dr. Nikki Dom office.   2. Chronic atrial fibrillation (HCC) HR ranging in the 70's-80's. Monitor HR every shift X 5 days then fax readings to Cardiologist office Dr. Nikki Dom.  3. (HFpEF) heart failure with preserved ejection fraction (HCC) Stable. No recent weight gain. Reports shortness of breath and wheezing with exertion.  Monitor weight daily. Portable CXR Pa/Lat. R/o PNA  4. Edema  1-2+ to both legs. Apply Knee high Ted hose on in the morning and off at bedtime. Continue daily weight.     Family/ staff Communication: Reviewed plan with patient and facility Nurse supervisor.  Labs/tests ordered:  CXR Pa/Lat. R/o PNA   Spent > 40 minutes counseling and discussing plan of care with patient, Facility Nurse supervisor; reviewing medical record; tests; labs; and developing  future plan of care.  Addendum: CXR results negative.

## 2016-08-08 NOTE — Telephone Encounter (Signed)
New message      Pt is calling to find out why her BP medicine was cut. Please call.

## 2016-08-11 ENCOUNTER — Encounter: Payer: Self-pay | Admitting: Family

## 2016-08-11 ENCOUNTER — Non-Acute Institutional Stay (SKILLED_NURSING_FACILITY): Payer: Medicare Other | Admitting: Family

## 2016-08-11 DIAGNOSIS — J449 Chronic obstructive pulmonary disease, unspecified: Secondary | ICD-10-CM | POA: Diagnosis not present

## 2016-08-11 DIAGNOSIS — F411 Generalized anxiety disorder: Secondary | ICD-10-CM | POA: Diagnosis not present

## 2016-08-11 DIAGNOSIS — E785 Hyperlipidemia, unspecified: Secondary | ICD-10-CM | POA: Diagnosis not present

## 2016-08-11 DIAGNOSIS — M353 Polymyalgia rheumatica: Secondary | ICD-10-CM

## 2016-08-11 DIAGNOSIS — N183 Chronic kidney disease, stage 3 unspecified: Secondary | ICD-10-CM

## 2016-08-11 DIAGNOSIS — R531 Weakness: Secondary | ICD-10-CM | POA: Diagnosis not present

## 2016-08-11 DIAGNOSIS — I5030 Unspecified diastolic (congestive) heart failure: Secondary | ICD-10-CM | POA: Diagnosis not present

## 2016-08-11 DIAGNOSIS — R310 Gross hematuria: Secondary | ICD-10-CM

## 2016-08-11 DIAGNOSIS — I482 Chronic atrial fibrillation, unspecified: Secondary | ICD-10-CM

## 2016-08-11 MED ORDER — ALBUTEROL SULFATE (2.5 MG/3ML) 0.083% IN NEBU: 2.5000 mg | INHALATION_SOLUTION | Freq: Four times a day (QID) | RESPIRATORY_TRACT | 1 refills | Status: DC | PRN

## 2016-08-11 NOTE — Progress Notes (Signed)
Patient ID: [name], female   DOB: 10/12/1946, 70 y.o.   MRN: [id number]   Location:   [facility] Room Number: 907 Place of Service:  SNF [phone])  Provider: Marlowe Sax, FNP-C   PCP: Steve Rattler, DO Patient Care Team: Steve Rattler, DO as PCP - General [name], MD (Gastroenterology) Thompson Grayer, MD (Cardiology)  Extended Emergency Contact Information Primary Emergency Contact: McNair,Veronica Address: [place], [place] [address] [place] Phone: [phone] Relation: Daughter Secondary Emergency Contact: Bary Castilla Address: [address]          [place], [place] [address] [place] Mobile Phone: [phone] Relation: Daughter  Code Status: Full Code Goals of care:  Advanced Directive information Advanced Directives 07/30/2016  Does patient have an advance directive? No  Would patient like information on creating an advanced directive? No - patient declined information  Pre-existing out of facility DNR order (yellow form or pink MOST form) -     Allergies  Allergen Reactions  . Ace Inhibitors Other (See Comments)    Angioedema  . Propofol Anaphylaxis and Swelling    Chief Complaint  Patient presents with  . Discharge Note    Discharge from facility    HPI:  70 y.o. female seen today at [facility] discharge home. She was here for short term rehabilitation post hospital admission from 07/10/16-07/26/16 with acute respiratory failure requiring ventilator support following a TEE procedure for her chronic afib to evaluate left atrial appendage and place watchman procedure where she became hypotensive and went into respiratory distress. TEE on 07/10/16 showed hypokinesis with EF 20-25%. She required pressor support and while on mechanical ventilator developed vocal cord edema and could not be extubated. She finally got extubated on 07/21/16 and was then  placed on Oxygen via nasal canula. During this admission, she developed hematuria thought to be from her foley catheter induced trauma and enterococcus UTI. She was placed on antibiotics. She has a medical history of HTN, Hyperlipidemia, CHF, Depression, GERD, COPD among other conditions. She is seen in her room today.During her stay here in Rehab she continued to have issues with Hematuria Hgb stable.she was referred to Urology 08/01/2016 scheduled for cystoscopy 08/04/2016.No more blood noted in the urine.She complains of wheezing at times does not think Albuterol Inhaler working well request Nebulizer. Recent Portable CXR negative.She has worked well with PT/OT now stable for discharge home.She will be discharged home with Home health PT/OT to continue with ROM, Exercise, Gait stability and muscle strengthening. She will require  DME: Rollator to allow her to maintain current level of independence with ADL's. Home health services will be arranged by facility social worker prior to discharge. Prescription medication will be written x 1 month then patient to follow up with PCP in 1-2 weeks.Facility staff report no new concerns.      Past Medical History:  Diagnosis Date  . Anemia   . Angina   . Angioedema    2/2 ACE  . Arteriovenous malformation of stomach   . Arthritis   . Asthma   . AVM (arteriovenous malformation) of colon    small intestine; stomach  . Blood transfusion   . CHF (congestive heart failure) ([place])   . Complete heart block ([place])    s/p PPM 1998  . DDD (degenerative disc disease)   . Depression   . Diastolic heart  failure   . Fatty liver 07/26/10  . GERD (gastroesophageal reflux disease)   . GI bleed   . History of alcohol abuse Stopped Fall 2012  . History of tobacco use Quit Fall 2012  . Hx of cardiovascular stress test    a. Lexiscan Myoview (10/15):  Small inferolateral and apical defect c/w scar and poss soft tissue attenuation, no ischemia, EF 42%  . Hx of colonic  polyp 08/13/10   adenomatous  . Hx of colonoscopy   . Hyperlipidemia   . Hyperlipidemia   . Hypertension   . Hypertrophic cardiomyopathy ([place])    dx by Dr [name] 2009  . Iron deficiency anemia   . Panic attack   . Panic attacks   . Permanent atrial fibrillation ([place])   . Renal failure    baseline creatinine 1.6  . Right arm pain 01/08/2012  . RLS (restless legs syndrome)    Dx 06/2007  . Shortness of breath    sob on exertation  . Sleep apnea     Past Surgical History:  Procedure Laterality Date  . CARDIAC CATHETERIZATION    . ESOPHAGOGASTRODUODENOSCOPY  12/23/2011   Procedure: ESOPHAGOGASTRODUODENOSCOPY (EGD);  Surgeon: [name], MD;  Location: [facility];  Service: Endoscopy;  Laterality: N/A;  . EXTUBATION (ENDOTRACHEAL) IN OR N/A 07/21/2016   Procedure: EXTUBATION (ENDOTRACHEAL) IN OR;  Surgeon: [name], MD;  Location: [facility];  Service: ENT;  Laterality: N/A;  . GIVENS CAPSULE STUDY  12/23/2011   Procedure: GIVENS CAPSULE STUDY;  Surgeon: [name], MD;  Location: [facility];  Service: Endoscopy;  Laterality: N/A;  . PACEMAKER INSERTION  1998   st jude, most recent gen change by [name] 4/12  . TUBAL LIGATION  04/01/2000      reports that she quit smoking about 4 years ago. Her smoking use included Cigarettes. She has a 5.00 pack-year smoking history. She has never used smokeless tobacco. She reports that she drinks alcohol. She reports that she does not use drugs. Social History   Social History  . Marital status: Divorced    Spouse name: N/A  . Number of children: 4  . Years of education: 9   Occupational History  . disabled Unemployed  . previously- house cleaning    Social History Main Topics  . Smoking status: Former Smoker    Packs/day: 0.10    Years: 50.00    Types: Cigarettes    Quit date: 08/16/2011  . Smokeless tobacco: Never Used     Comment: quit 2012  . Alcohol use 0.0 oz/week     Comment: quit 08/16/2011  . Drug use: No  . Sexual  activity: No   Other Topics Concern  . Not on file   Social History Narrative   On disability for heart failure/pacemaker.     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    4 grown children,  8 grandchildren.     Drinks alcohol a few times a week socially. At one time, the most she reports drinking is about 3 mixed drinks.   Lives with daughter [name] and son. Will be moving 04/2011 to different house because concerned children are taking advantage of her financial situation.    No illicit drugs.   Smokes few cigarettes daily, especially when stressed. Started smoking @ age 78. Quit January 2012 2/2 health but started smoking again 02/2011.       Health Care POA:    Emergency Contact: daughter, Verdene Lennert [phone]  End of Life Plan: gave pt AD info pamphlet   Who lives with you: no one- lives in section 8 housing   Any pets: none   Diet: Pt has a varied diet of protein, starch and vegetables   Exercise: Pt has no regular exercise routine.   Seatbelts: Pt reports wearing seatbelt when in vehicles.    Hobbies: dancing      Allergies  Allergen Reactions  . Ace Inhibitors Other (See Comments)    Angioedema  . Propofol Anaphylaxis and Swelling    Pertinent  Health Maintenance Due  Topic Date Due  . LIPID PANEL  03/27/2016  . INFLUENZA VACCINE  07/15/2016  . MAMMOGRAM  04/11/2018  . COLONOSCOPY  08/24/2021  . DEXA SCAN  Completed  . PNA vac Low Risk Adult  Completed    Medications:   Medication List       Accurate as of 08/11/16 11:03 AM. Always use your most recent med list.          acetaminophen 325 MG tablet Commonly known as:  TYLENOL Take 325 mg by mouth daily as needed (pain).   ALPRAZolam 0.5 MG tablet Commonly known as:  XANAX Take 1 tablet (0.5 mg total) by mouth at bedtime as needed for anxiety.   aspirin EC 81 MG tablet Take 81 mg by mouth daily.   Coenzyme Q-10 200 MG Caps Take 1 capsule by mouth as directed.   DULCOLAX 10 MG  suppository Generic drug:  bisacodyl Place 10 mg rectally. Take every other day as needed.   esomeprazole 40 MG capsule Commonly known as:  NEXIUM Take 40 mg by mouth daily.   ferrous sulfate 325 (65 FE) MG tablet TAKE 1 TABLET BY MOUTH TWICE DAILY WITH MEALS TO KEEP BLOOD COUNT UP   furosemide 20 MG tablet Commonly known as:  LASIX Take 1 tablet by mouth on Tues, Thurs, Sat and Sun   multivitamin capsule Take 1 capsule by mouth daily.   nitroGLYCERIN 0.4 MG SL tablet Commonly known as:  NITROSTAT Place 1 tablet (0.4 mg total) under the tongue every 5 (five) minutes as needed for chest pain.   polyethylene glycol packet Commonly known as:  MIRALAX / GLYCOLAX Take 17 g by mouth daily.   predniSONE 1 MG tablet Commonly known as:  DELTASONE Take 5 tablets by mouth daily with breakfast   PROAIR HFA 108 (90 Base) MCG/ACT inhaler Generic drug:  albuterol INHALE 2 PUFFS INTO THE LUNGS EVERY 6 HOURS AS NEEDED FOR WHEEZING OR SHORTNESS OF BREATH   PROSTAT PO Take 30 mLs by mouth daily.   rosuvastatin 20 MG tablet Commonly known as:  CRESTOR TAKE 1 TABLET BY MOUTH DAILY ON MONDAY, WEDNESDAY, AND FRIDAY   senna 8.6 MG tablet Commonly known as:  SENOKOT Take 1 tablet by mouth at bedtime.       Review of Systems  Constitutional: Negative for activity change, appetite change, chills, fatigue and fever.  HENT: Negative for congestion, rhinorrhea, sinus pressure, sneezing and sore throat.   Eyes: Negative.   Respiratory: Negative for cough and chest tightness.        Wheezing at times  Cardiovascular: Negative for chest pain, palpitations and leg swelling.  Gastrointestinal: Negative for abdominal distention, abdominal pain, blood in stool, constipation, diarrhea, nausea and vomiting.  Endocrine: Negative.   Genitourinary: Negative for difficulty urinating, dysuria, flank pain, frequency, hematuria, urgency, vaginal bleeding, vaginal discharge and vaginal pain.    Musculoskeletal: Positive for gait problem.  Skin:  Negative for color change, pallor and rash.  Neurological: Negative for dizziness, seizures, syncope, light-headedness and headaches.  Hematological: Does not bruise/bleed easily.  Psychiatric/Behavioral: Negative for agitation, confusion, hallucinations and sleep disturbance. The patient is not nervous/anxious.     Vitals:   08/11/16 1041  BP: 130/76  Pulse: (!) 20  Resp: 20  Temp: 97.1 F (36.2 C)  TempSrc: Oral  SpO2: 93%  Weight: 175 lb 8 oz (79.6 kg)  Height: 5\' 5"  (1.651 m)   Body mass index is 29.2 kg/m. Physical Exam  Constitutional: She is oriented to person, place, and time. She appears well-developed and well-nourished. No distress.  HENT:  Head: Normocephalic.  Mouth/Throat: Oropharynx is clear and moist. No oropharyngeal exudate.  Eyes: Conjunctivae and EOM are normal. Pupils are equal, round, and reactive to light. Right eye exhibits no discharge. Left eye exhibits no discharge. No scleral icterus.  Neck: Normal range of motion. No JVD present. No thyromegaly present.  Cardiovascular: Normal rate, regular rhythm, normal heart sounds and intact distal pulses.  Exam reveals no gallop and no friction rub.   No murmur heard. Pulmonary/Chest: Effort normal and breath sounds normal. No respiratory distress. She has no wheezes. She has no rales.  Abdominal: Soft. Bowel sounds are normal. She exhibits no distension. There is no tenderness. There is no rebound and no guarding.  Genitourinary: No vaginal discharge found.  Genitourinary Comments: Continent   Musculoskeletal: She exhibits no edema, tenderness or deformity.  Bilateral lower extremities weakness   Lymphadenopathy:    She has no cervical adenopathy.  Neurological: She is oriented to person, place, and time.  Skin: Skin is warm and dry. No rash noted. No erythema. No pallor.  Psychiatric: She has a normal mood and affect.    Labs reviewed: Basic Metabolic  Panel:  Recent Labs  07/20/16 0330 07/21/16 0249 07/22/16 0320  07/24/16 0340 07/25/16 0310 07/26/16 0729 07/28/16  NA 145 143 139  < > 134* 137 137 140  K 5.0 4.9 4.1  < > 4.0 3.7 3.9 4.1  CL 106 105 101  < > 101 101 104  --   CO2 33* 32 31  < > 25 25 23   --   GLUCOSE 101* 97 114*  < > 98 106* 111*  --   BUN 70* 60* 50*  < > 41* 34* 21* 21  CREATININE 1.16* 1.16* 1.13*  < > 1.27* 1.23* 1.17* 1.1  CALCIUM 10.0 9.9 9.9  < > 8.9 9.1 9.2  --   MG 2.4 2.3 2.2  --   --   --   --   --   PHOS 4.3 3.1 4.2  --   --   --   --   --   < > = values in this interval not displayed. Liver Function Tests:  Recent Labs  02/05/16 0749 02/06/16 0757 07/20/16 0330 07/21/16 0249 07/28/16  AST 23 20  --   --  26  ALT 12* 11*  --   --  21  ALKPHOS 38 33*  --   --  38  BILITOT 0.5 0.2*  --   --   --   PROT 7.1 6.1*  --   --   --   ALBUMIN 3.5 3.0* 2.5* 2.4*  --    CBC:  Recent Labs  07/20/16 0330 07/21/16 0249 07/23/16 0404 07/24/16 0330 07/25/16 0310 07/26/16 0729  WBC 7.3 8.0 7.0 4.9 5.1 4.5  NEUTROABS 5.2 6.1 5.4  --   --   --  HGB 9.4* 9.3* 10.2* 9.8* 9.5* 9.4*  HCT 32.5* 32.0* 34.2* 32.8* 31.3* 31.8*  MCV 87.8 87.9 87.7 85.4 85.1 85.0  PLT 270 270 269 288 283 292   Cardiac Enzymes:  Recent Labs  02/05/16 2159 02/06/16 0757 02/06/16 1402  TROPONINI 0.38* 0.37* 0.39*    Recent Labs  07/25/16 2053 07/26/16 0810 07/26/16 1123  GLUCAP 132* 122* 133*    Assessment/Plan:   Chronic Afib  HR controlled. Not a candidate for anticoagulation due to previous GI bleeding. Coreg D/ced during hospital admission recommended restarting coreg in low dose if Afib recur. Continue to follow up with Cardiologist Dr. Thompson Grayer.  CHF Stable. No weight gain or edema. Continue on Furosemide 20 mg Tablet on Tue,Thurs, Sat and Sunday. Check weight daily and Notify provider for abrupt weight gain.   COPD Occasional Wheezing.Exam findings negative. Current Albuterol inhaler  ineffective prefers Nebulizer. Will initiate Albuterol via Nebulizer. Continue to monitor.    CKD stage 3  Monitor. BMP in 1-2 weeks with PCP   Hyperlipidemia  Continue on Crestor 20 mg tablet on Mon, Wed and Fri . Monitor Lipid panel periodically.   Generalized Anxiety  Continue on Alprazolam PRN.  Generalized weakness  Has improved with PT/OT. Will discharge home with HH: PT/OT to continue with ROM, Exercise, Gait stability and muscle strengthening. She will require  DME: Rollator to allow her to maintain current level of independence with ADL's.  Hematuria  Resolved.    PMR  Continue on chronic Prednisone 5 mg Tablet daily.    Patient is being discharged with the following home health services:     PT/OT to continue with ROM, Exercise, Gait stability and muscle strengthening.   Patient is being discharged with the following durable medical equipment:    A Rollator to allow her to maintain current level of independence with ADL's.   Patient has been advised to f/u with their PCP in 1-2 weeks to bring them up to date on their rehab stay.  Social services at facility was responsible for arranging this appointment.  Pt was provided with a 30 day supply of prescriptions for medications and refills must be obtained from their PCP.  For controlled substances, a more limited supply may be provided adequate until PCP appointment only.  Future labs/tests needed:  CBC, BMP in 1-2 weeks with PCP

## 2016-08-15 DIAGNOSIS — N183 Chronic kidney disease, stage 3 (moderate): Secondary | ICD-10-CM | POA: Diagnosis not present

## 2016-08-15 DIAGNOSIS — I5030 Unspecified diastolic (congestive) heart failure: Secondary | ICD-10-CM | POA: Diagnosis not present

## 2016-08-15 DIAGNOSIS — M353 Polymyalgia rheumatica: Secondary | ICD-10-CM | POA: Diagnosis not present

## 2016-08-15 DIAGNOSIS — R2689 Other abnormalities of gait and mobility: Secondary | ICD-10-CM | POA: Diagnosis not present

## 2016-08-15 DIAGNOSIS — I130 Hypertensive heart and chronic kidney disease with heart failure and stage 1 through stage 4 chronic kidney disease, or unspecified chronic kidney disease: Secondary | ICD-10-CM | POA: Diagnosis not present

## 2016-08-15 DIAGNOSIS — Z7289 Other problems related to lifestyle: Secondary | ICD-10-CM | POA: Diagnosis not present

## 2016-08-15 DIAGNOSIS — Z7952 Long term (current) use of systemic steroids: Secondary | ICD-10-CM | POA: Diagnosis not present

## 2016-08-15 DIAGNOSIS — J449 Chronic obstructive pulmonary disease, unspecified: Secondary | ICD-10-CM | POA: Diagnosis not present

## 2016-08-15 DIAGNOSIS — Z950 Presence of cardiac pacemaker: Secondary | ICD-10-CM | POA: Diagnosis not present

## 2016-08-15 DIAGNOSIS — R531 Weakness: Secondary | ICD-10-CM | POA: Diagnosis not present

## 2016-08-15 DIAGNOSIS — Z7982 Long term (current) use of aspirin: Secondary | ICD-10-CM | POA: Diagnosis not present

## 2016-08-17 ENCOUNTER — Emergency Department (HOSPITAL_COMMUNITY): Payer: Medicare Other

## 2016-08-17 ENCOUNTER — Encounter (HOSPITAL_COMMUNITY): Payer: Self-pay

## 2016-08-17 ENCOUNTER — Inpatient Hospital Stay (HOSPITAL_COMMUNITY)
Admission: EM | Admit: 2016-08-17 | Discharge: 2016-08-21 | DRG: 286 | Disposition: A | Discharge status: Home Health | Payer: Medicare Other | Attending: Family Medicine | Admitting: Family Medicine

## 2016-08-17 DIAGNOSIS — N179 Acute kidney failure, unspecified: Secondary | ICD-10-CM | POA: Diagnosis not present

## 2016-08-17 DIAGNOSIS — Z950 Presence of cardiac pacemaker: Secondary | ICD-10-CM

## 2016-08-17 DIAGNOSIS — J449 Chronic obstructive pulmonary disease, unspecified: Secondary | ICD-10-CM | POA: Diagnosis present

## 2016-08-17 DIAGNOSIS — Z7952 Long term (current) use of systemic steroids: Secondary | ICD-10-CM | POA: Diagnosis not present

## 2016-08-17 DIAGNOSIS — Z87891 Personal history of nicotine dependence: Secondary | ICD-10-CM

## 2016-08-17 DIAGNOSIS — G4730 Sleep apnea, unspecified: Secondary | ICD-10-CM | POA: Diagnosis not present

## 2016-08-17 DIAGNOSIS — R778 Other specified abnormalities of plasma proteins: Secondary | ICD-10-CM

## 2016-08-17 DIAGNOSIS — I110 Hypertensive heart disease with heart failure: Secondary | ICD-10-CM | POA: Diagnosis not present

## 2016-08-17 DIAGNOSIS — I248 Other forms of acute ischemic heart disease: Secondary | ICD-10-CM | POA: Diagnosis present

## 2016-08-17 DIAGNOSIS — G2581 Restless legs syndrome: Secondary | ICD-10-CM | POA: Diagnosis not present

## 2016-08-17 DIAGNOSIS — Z7982 Long term (current) use of aspirin: Secondary | ICD-10-CM

## 2016-08-17 DIAGNOSIS — I482 Chronic atrial fibrillation: Secondary | ICD-10-CM | POA: Diagnosis not present

## 2016-08-17 DIAGNOSIS — R0602 Shortness of breath: Secondary | ICD-10-CM

## 2016-08-17 DIAGNOSIS — R05 Cough: Secondary | ICD-10-CM | POA: Diagnosis not present

## 2016-08-17 DIAGNOSIS — E785 Hyperlipidemia, unspecified: Secondary | ICD-10-CM | POA: Diagnosis present

## 2016-08-17 DIAGNOSIS — I130 Hypertensive heart and chronic kidney disease with heart failure and stage 1 through stage 4 chronic kidney disease, or unspecified chronic kidney disease: Secondary | ICD-10-CM | POA: Diagnosis not present

## 2016-08-17 DIAGNOSIS — I5033 Acute on chronic diastolic (congestive) heart failure: Secondary | ICD-10-CM | POA: Diagnosis present

## 2016-08-17 DIAGNOSIS — I422 Other hypertrophic cardiomyopathy: Secondary | ICD-10-CM | POA: Diagnosis present

## 2016-08-17 DIAGNOSIS — L309 Dermatitis, unspecified: Secondary | ICD-10-CM | POA: Diagnosis present

## 2016-08-17 DIAGNOSIS — I272 Other secondary pulmonary hypertension: Secondary | ICD-10-CM | POA: Diagnosis not present

## 2016-08-17 DIAGNOSIS — N183 Chronic kidney disease, stage 3 (moderate): Secondary | ICD-10-CM | POA: Diagnosis not present

## 2016-08-17 DIAGNOSIS — D509 Iron deficiency anemia, unspecified: Secondary | ICD-10-CM | POA: Diagnosis present

## 2016-08-17 DIAGNOSIS — I481 Persistent atrial fibrillation: Secondary | ICD-10-CM | POA: Diagnosis present

## 2016-08-17 DIAGNOSIS — F411 Generalized anxiety disorder: Secondary | ICD-10-CM | POA: Diagnosis present

## 2016-08-17 DIAGNOSIS — R7989 Other specified abnormal findings of blood chemistry: Secondary | ICD-10-CM

## 2016-08-17 DIAGNOSIS — K5900 Constipation, unspecified: Secondary | ICD-10-CM | POA: Diagnosis present

## 2016-08-17 DIAGNOSIS — M353 Polymyalgia rheumatica: Secondary | ICD-10-CM | POA: Diagnosis not present

## 2016-08-17 DIAGNOSIS — R079 Chest pain, unspecified: Secondary | ICD-10-CM

## 2016-08-17 DIAGNOSIS — I509 Heart failure, unspecified: Secondary | ICD-10-CM | POA: Diagnosis not present

## 2016-08-17 LAB — URINE MICROSCOPIC-ADD ON
Bacteria, UA: NONE SEEN
RBC / HPF: NONE SEEN RBC/hpf (ref 0–5)

## 2016-08-17 LAB — URINALYSIS, ROUTINE W REFLEX MICROSCOPIC
Bilirubin Urine: NEGATIVE
Glucose, UA: NEGATIVE mg/dL
Hgb urine dipstick: NEGATIVE
Ketones, ur: NEGATIVE mg/dL
Nitrite: NEGATIVE
Protein, ur: 100 mg/dL — AB
Specific Gravity, Urine: 1.019 (ref 1.005–1.030)
pH: 6 (ref 5.0–8.0)

## 2016-08-17 LAB — CBC
HEMATOCRIT: 29.6 % — AB (ref 36.0–46.0)
Hemoglobin: 8.8 g/dL — ABNORMAL LOW (ref 12.0–15.0)
MCH: 26 pg (ref 26.0–34.0)
MCHC: 29.7 g/dL — ABNORMAL LOW (ref 30.0–36.0)
MCV: 87.6 fL (ref 78.0–100.0)
Platelets: 227 10*3/uL (ref 150–400)
RBC: 3.38 MIL/uL — AB (ref 3.87–5.11)
RDW: 16.9 % — AB (ref 11.5–15.5)
WBC: 5.3 10*3/uL (ref 4.0–10.5)

## 2016-08-17 LAB — I-STAT TROPONIN, ED
Troponin i, poc: 0.4 ng/mL (ref 0.00–0.08)
Troponin i, poc: 0.35 ng/mL (ref 0.00–0.08)

## 2016-08-17 LAB — BASIC METABOLIC PANEL
Anion gap: 6 (ref 5–15)
BUN: 17 mg/dL (ref 6–20)
CHLORIDE: 108 mmol/L (ref 101–111)
CO2: 25 mmol/L (ref 22–32)
Calcium: 9.5 mg/dL (ref 8.9–10.3)
Creatinine, Ser: 1.15 mg/dL — ABNORMAL HIGH (ref 0.44–1.00)
GFR calc Af Amer: 54 mL/min — ABNORMAL LOW (ref >60)
GFR calc non Af Amer: 46 mL/min — ABNORMAL LOW (ref >60)
Glucose, Bld: 111 mg/dL — ABNORMAL HIGH (ref 65–99)
POTASSIUM: 3.7 mmol/L (ref 3.5–5.1)
SODIUM: 139 mmol/L (ref 135–145)

## 2016-08-17 LAB — TROPONIN I: Troponin I: 0.43 ng/mL (ref <0.03)

## 2016-08-17 LAB — TSH: TSH: 0.938 u[IU]/mL (ref 0.350–4.500)

## 2016-08-17 LAB — BRAIN NATRIURETIC PEPTIDE: B NATRIURETIC PEPTIDE 5: 466.6 pg/mL — AB (ref 0.0–100.0)

## 2016-08-17 MED ORDER — HYDRALAZINE HCL 20 MG/ML IJ SOLN: 5.0000 mg | INTRAMUSCULAR | Status: DC | PRN

## 2016-08-17 MED ORDER — ACETAMINOPHEN 650 MG RE SUPP: 650.0000 mg | Freq: Four times a day (QID) | RECTAL | Status: DC | PRN

## 2016-08-17 MED ORDER — FUROSEMIDE 10 MG/ML IJ SOLN
40.0000 mg | Freq: Once | INTRAMUSCULAR | Status: AC
  Administered 2016-08-17: 40 mg via INTRAVENOUS
  Filled 2016-08-17: qty 4

## 2016-08-17 MED ORDER — ASPIRIN 325 MG PO TABS
325.0000 mg | ORAL_TABLET | Freq: Every day | ORAL | Status: DC
  Administered 2016-08-17: 325 mg via ORAL
  Filled 2016-08-17: qty 1

## 2016-08-17 MED ORDER — NITROGLYCERIN 0.4 MG SL SUBL: 0.4000 mg | SUBLINGUAL_TABLET | SUBLINGUAL | Status: DC | PRN

## 2016-08-17 MED ORDER — ASPIRIN EC 81 MG PO TBEC
81.0000 mg | DELAYED_RELEASE_TABLET | Freq: Every day | ORAL | Status: DC
  Administered 2016-08-17 – 2016-08-21 (×5): 81 mg via ORAL
  Filled 2016-08-17 (×5): qty 1

## 2016-08-17 MED ORDER — ONDANSETRON HCL 4 MG/2ML IJ SOLN: 4.0000 mg | Freq: Three times a day (TID) | INTRAMUSCULAR | Status: AC | PRN

## 2016-08-17 MED ORDER — ACETAMINOPHEN 325 MG PO TABS: 650.0000 mg | ORAL_TABLET | Freq: Four times a day (QID) | ORAL | Status: DC | PRN

## 2016-08-17 MED ORDER — ALPRAZOLAM 0.5 MG PO TABS
0.5000 mg | ORAL_TABLET | Freq: Every evening | ORAL | Status: DC | PRN
  Administered 2016-08-20: 0.5 mg via ORAL
  Filled 2016-08-17: qty 1

## 2016-08-17 MED ORDER — IPRATROPIUM-ALBUTEROL 0.5-2.5 (3) MG/3ML IN SOLN: 3.0000 mL | RESPIRATORY_TRACT | Status: DC | PRN

## 2016-08-17 MED ORDER — ENOXAPARIN SODIUM 40 MG/0.4ML ~~LOC~~ SOLN
40.0000 mg | SUBCUTANEOUS | Status: DC
  Administered 2016-08-17 – 2016-08-18 (×2): 40 mg via SUBCUTANEOUS
  Filled 2016-08-17 (×3): qty 0.4

## 2016-08-17 MED ORDER — FUROSEMIDE 10 MG/ML PO SOLN
40.0000 mg | Freq: Once | ORAL | Status: DC
  Filled 2016-08-17: qty 4

## 2016-08-17 MED ORDER — FERROUS SULFATE 325 (65 FE) MG PO TABS
325.0000 mg | ORAL_TABLET | Freq: Two times a day (BID) | ORAL | Status: DC
  Administered 2016-08-18 – 2016-08-21 (×7): 325 mg via ORAL
  Filled 2016-08-17 (×7): qty 1

## 2016-08-17 MED ORDER — FERROUS SULFATE 325 (65 FE) MG PO TABS: 325.0000 mg | ORAL_TABLET | Freq: Two times a day (BID) | ORAL | Status: DC

## 2016-08-17 MED ORDER — ALBUTEROL SULFATE (2.5 MG/3ML) 0.083% IN NEBU
2.5000 mg | INHALATION_SOLUTION | Freq: Once | RESPIRATORY_TRACT | Status: AC
  Administered 2016-08-17: 2.5 mg via RESPIRATORY_TRACT
  Filled 2016-08-17: qty 3

## 2016-08-17 MED ORDER — POLYETHYLENE GLYCOL 3350 17 G PO PACK: 17.0000 g | PACK | Freq: Every day | ORAL | Status: DC | PRN

## 2016-08-17 NOTE — H&P (Signed)
[facility] Admission History and Physical Service Pager: [phone]  Patient name: [name] Medical record number: [id number] Date of birth: [birth year] Age: 70 y.o. Gender: female  Primary Care Provider: Steve Rattler, DO Consultants: none Code Status: FULL  Chief Complaint: dyspnea  Assessment and Plan: [name] is a 70 y.o. female presenting with dyspnea. PMH is significant for recent ICU stay (7/27-8/7), difficult airway, afib without anticoag 2/2 GI bleeds, pacemaker, COPD, CHF (20-25%, non obstructive HOCM, pacer leads intact), HLD, GAD, HTN, CKDIII.   CHF exacerbation: Pt denies any dietary changes. Doesn't regularly check weights at home. Manages her own medicines, but she is not able to explain her lasix regimen well, so some concern for accidentally mismanaging this (supposed to be Lasix 20mg  Tues, Thurs, Sat, Sun). Trop 0.4, EKG unchanged from previous. CXR consistent with pulmonary edema and stable cardiomegaly, pacer in place. She has no peripheral edema.  However, BNP elevated to 466.6, most recently 144 on 02/05/16, 436 on 11/25/2015. Additional differential diagnoses include COPD exacerbation, airway irritation secondary to prolonged intubation. She is only on albuterol at home. Lung exam is remarkable only for upper airway sound; no wheeze or crackles -admit to tele, Dr. Ree Kida attending -consulted cards (Dr. Radford Pax). She is not impressed with  her troponin given history of CHF -Low threshold for CCM consult given history of intubation and difficult airway -trend trops -EKG in AM or PRN chest pain -lasix 40mg  IV once. Can redose as needed -strict I&Os  -daily weights -PT eval  Afib: CHADsVASC score 4. Also history of AVB with pacemaker. She is being paced now. Not further anticoagulated secondary to history of ?GI bleeds.  -continue home ASA 81mg   COPD: Only on albuterol at home. Consider adding additional controller  meds. No home O2.  -duonebs q6H PRN -Oxygen as needed -Low threshold for CCM consult given history of intubation and difficult airway  GAD: Home meds include xanax 0.5mg  QHS.  -continue home xanax  HTN: Hypertensive on admission. No home BP meds, norvasc was d/c'd on discharge 07/26/16. -PRN hydralazine SBP>160/DBP>110 -monitor BP   Anemia: Hemoglobin 8.8 (baseline 9-10) , MCV 87.6 suggestive for anemia of chronic disease. She has a good bone marrow response with RDW of 17. No recent anemia panel. She has history of GI bleed. No recent colonoscopy on file also she had capsule endoscopy in 2013 for GI bleed -continue home iron -CBC in the morning  Constipation: reports BM at home every 3 days with occasional suppository use.  -miralax daily PRN  FEN/GI: heart healthy carb mod  Prophylaxis: lovenox  Disposition: Admitted to telemetry for evaluation and management of dyspnea.   History of Present Illness:  [name] is a 70 y.o. female presenting with dyspnea. She was recently admitted to the ICU after complications of a planned TEE and Watchman device (left atrial appendage closure system for long term stroke prevention) > intubated 7/27-8/7, extubated by ENT in the OR. Since discharge on 8/12, she had been staying at [facility] until last Weds. She does note shortness of breath at baseline. This was also present while at rehabilitation. In terms of her CHF, she does not weigh herself at home for the purpose of titrating her fluid pills. She doesn't recall any drastic changes in her weights recently. Is not on any antihypertensives, Norvasc was discontinued after her last admission. She reports compliance with her Lasix and her one inhaler, which is albuterol. She notes she has been using her  albuterol inhaler every 6 hours since leaving the snf. No shortness of breath at rest. She notes dyspnea on ambulation. She does notice wheezing at rest, has been short of breath while  laying flat for greater than 5 months. Uses 3 pillows at home, no acute increase in this. Recently has noted that she has been waking up at night sweating, did not check her temperature. Has noted a sore throat since recent discharge after prolonged intubation. She has chronic chest pain that is unchanged in nature today. Of note, she quit smoking 3 years ago. She does being somewhat compliant with a reduced sodium diet, since her discharge from snf daughter has been cooking for her and trying to limit her salt intake. For now, her daughter and son are both living with her. She does not use any alcohol. Notes a dry nonproductive cough.  Review Of Systems: Per HPI with the following additions: none Otherwise the remainder of the systems were negative.  Patient Active Problem List   Diagnosis Date Noted  . Shortness of breath 08/17/2016  . Delirium   . Esophageal abnormality   . Endotracheal tube present   . Somnolence   . Respiratory failure ([place]) 07/11/2016  . Persistent atrial fibrillation ([place]) 07/10/2016  . Acute respiratory failure ([place])   . Hypotension due to drugs   . Demand ischemia ([place])   . Chest discomfort 02/05/2016  . COPD exacerbation ([place])   . Palpitations   . Chest pain 11/25/2015  . Dyspnea on exertion   . Polymyalgia rheumatica ([place]) 04/10/2015  . Low back pain 03/28/2015  . Hypertrophic cardiomyopathy ([place]) 02/15/2015  . Eczema 09/18/2014  . Environmental allergies 09/18/2014  . Pre-diabetes 11/26/2013  . COPD, moderate ([place]) 11/01/2013  . Chronic renal failure, stage 3 (moderate) 08/28/2012  . Preventative health care 08/18/2012  . Complete heart block ([place]) 04/03/2011  . Anemia 02/17/2011  . AVM (arteriovenous malformation) 02/13/2011  . PACEMAKER-[facility] 11/28/2010  . CHRONIC KIDNEY DISEASE STAGE III (MODERATE) 09/24/2010  . (HFpEF) heart failure with preserved ejection fraction ([place]) 08/30/2010  . ARTHRITIS 07/24/2010  . Generalized anxiety disorder  07/23/2010  . Chronic atrial fibrillation ([place]) 07/16/2010  . HLD (hyperlipidemia) 02/11/2007  . Hypertension 02/11/2007  . APNEA, SLEEP 02/11/2007    Past Medical History: Past Medical History:  Diagnosis Date  . Anemia   . Angina   . Angioedema    2/2 ACE  . Arteriovenous malformation of stomach   . Arthritis   . Asthma   . AVM (arteriovenous malformation) of colon    small intestine; stomach  . Blood transfusion   . CHF (congestive heart failure) ([place])   . Complete heart block ([place])    s/p PPM 1998  . DDD (degenerative disc disease)   . Depression   . Diastolic heart failure   . Fatty liver 07/26/10  . GERD (gastroesophageal reflux disease)   . GI bleed   . History of alcohol abuse Stopped Fall 2012  . History of tobacco use Quit Fall 2012  . Hx of cardiovascular stress test    a. Lexiscan Myoview (10/15):  Small inferolateral and apical defect c/w scar and poss soft tissue attenuation, no ischemia, EF 42%  . Hx of colonic polyp 08/13/10   adenomatous  . Hx of colonoscopy   . Hyperlipidemia   . Hyperlipidemia   . Hypertension   . Hypertrophic cardiomyopathy ([place])    dx by Dr [name] 2009  . Iron deficiency anemia   . Panic attack   .  Panic attacks   . Permanent atrial fibrillation ([place])   . Renal failure    baseline creatinine 1.6  . Right arm pain 01/08/2012  . RLS (restless legs syndrome)    Dx 06/2007  . Shortness of breath    sob on exertation  . Sleep apnea     Past Surgical History: Past Surgical History:  Procedure Laterality Date  . CARDIAC CATHETERIZATION    . ESOPHAGOGASTRODUODENOSCOPY  12/23/2011   Procedure: ESOPHAGOGASTRODUODENOSCOPY (EGD);  Surgeon: [name], MD;  Location: [facility];  Service: Endoscopy;  Laterality: N/A;  . EXTUBATION (ENDOTRACHEAL) IN OR N/A 07/21/2016   Procedure: EXTUBATION (ENDOTRACHEAL) IN OR;  Surgeon: [name], MD;  Location: [facility];  Service: ENT;  Laterality: N/A;  . GIVENS CAPSULE STUDY  12/23/2011    Procedure: GIVENS CAPSULE STUDY;  Surgeon: [name], MD;  Location: [facility];  Service: Endoscopy;  Laterality: N/A;  . PACEMAKER INSERTION  1998   [facility], most recent gen change by [name] 4/12  . TUBAL LIGATION  04/01/2000    Social History: Social History  Substance Use Topics  . Smoking status: Former Smoker    Packs/day: 0.10    Years: 50.00    Types: Cigarettes    Quit date: 08/16/2011  . Smokeless tobacco: Never Used     Comment: quit 2012  . Alcohol use 0.0 oz/week     Comment: quit 08/16/2011   Additional social history: none  Please also refer to relevant sections of EMR.  Family History: Family History  Problem Relation Age of Onset  . Hypertension Mother   . Stroke Mother   . Heart disease Mother   . Aneurysm Mother   . CVA Mother   . Hypertension Father   . Stroke Father   . Heart attack Father   . Alcohol abuse Father   . Hypertension Sister   . Hypertension Brother   . Colon cancer Brother   . Diabetes Sister   . Diabetes Brother   . Cancer Brother   . Heart attack Brother   . Heart disease Sister   . Alcohol abuse Brother     Allergies and Medications: Allergies  Allergen Reactions  . Ace Inhibitors Other (See Comments)    Angioedema  . Propofol Anaphylaxis and Swelling   Current Facility-Administered Medications on File Prior to Encounter  Medication Dose Route Frequency Provider Last Rate Last Dose  . cyanocobalamin ((VITAMIN B-12)) injection 1,000 mcg  1,000 mcg Intramuscular Q30 days [name], MD   1,000 mcg at 09/16/11 1341   Current Outpatient Prescriptions on File Prior to Encounter  Medication Sig Dispense Refill  . ALPRAZolam (XANAX) 0.5 MG tablet Take 1 tablet (0.5 mg total) by mouth at bedtime as needed for anxiety. (Patient taking differently: Take 0.25-0.5 mg by mouth at bedtime as needed for anxiety. ) 15 tablet 0  . acetaminophen (TYLENOL) 325 MG tablet Take 325 mg by mouth daily as needed (pain).    [name] albuterol  (PROVENTIL) (2.5 MG/3ML) 0.083% nebulizer solution Take 3 mLs (2.5 mg total) by nebulization every 6 (six) hours as needed for wheezing or shortness of breath. 150 mL 1  . aspirin EC 81 MG tablet Take 81 mg by mouth daily.     . bisacodyl (DULCOLAX) 10 MG suppository Place 10 mg rectally. Take every other day as needed.    . Coenzyme Q-10 200 MG CAPS Take 1 capsule by mouth as directed.     [name] esomeprazole (  NEXIUM) 40 MG capsule Take 40 mg by mouth daily.     . ferrous sulfate 325 (65 FE) MG tablet TAKE 1 TABLET BY MOUTH TWICE DAILY WITH MEALS TO KEEP BLOOD COUNT UP (Patient taking differently: Take 325 mg by mouth twice daily) 60 tablet 11  . furosemide (LASIX) 20 MG tablet Take 1 tablet by mouth on Tues, Thurs, Sat and Sun    . Multiple Vitamin (MULTIVITAMIN) capsule Take 1 capsule by mouth daily.    . nitroGLYCERIN (NITROSTAT) 0.4 MG SL tablet Place 1 tablet (0.4 mg total) under the tongue every 5 (five) minutes as needed for chest pain. 25 tablet 1  . polyethylene glycol (MIRALAX / GLYCOLAX) packet Take 17 g by mouth daily. 14 each 0  . predniSONE (DELTASONE) 1 MG tablet Take 5 tablets by mouth daily with breakfast 150 tablet 0  . rosuvastatin (CRESTOR) 20 MG tablet TAKE 1 TABLET BY MOUTH DAILY ON MONDAY, WEDNESDAY, AND FRIDAY 38 tablet 3  . senna (SENOKOT) 8.6 MG tablet Take 1 tablet by mouth at bedtime.       Objective: BP 143/87   Pulse 70   Temp 98.1 F (36.7 C) (Oral)   Resp 23   Ht 5\' 5"  (1.651 m)   Wt 175 lb (79.4 kg)   SpO2 98%   BMI 29.12 kg/m  Exam: General: Obese female in NAD, lying in bed. Eyes: EOMI ENTM: moist mucous membranes Neck: +JVD Cardiovascular: rrr, no m/r/g, no edema bilaterally, dorsalis pedis pulses 2+ bilaterally Respiratory: CTAB, no wheezes or crackles. Notable upper airway wheezing. Abdomen: SNTND, + BS Skin: no rashes or wounds, warm to touch over her extremities.  Neuro: CN II-XII grossly intact Psych: mood and affect appropriate  Labs and  Imaging: CBC BMET   Recent Labs Lab 08/17/16 0959  WBC 5.3  HGB 8.8*  HCT 29.6*  PLT 227    Recent Labs Lab 08/17/16 0959  NA 139  K 3.7  CL 108  CO2 25  BUN 17  CREATININE 1.15*  GLUCOSE 111*  CALCIUM 9.5      Sela Hilding, MD 08/17/2016, 2:03 PM PGY-1, [place] Intern pager: [phone], text pages welcome  I have seen and evaluated the patient with Dr. Lindell Noe. I am in agreement with the note above in its revised form. My additions are in red.  Wendee Beavers, MD, PGY-2 08/17/2016 9:45 PM

## 2016-08-17 NOTE — ED Triage Notes (Signed)
Patient complains of increasing shortness of breath that has gotten worse the past few days, no CP on arrival. Patient poor historian but reports recent intubation. Has CHF but denies asthma, COPD. Alert and oriented, speaking broken sentences

## 2016-08-17 NOTE — ED Notes (Signed)
Family made aware of pts bed assignment 

## 2016-08-17 NOTE — ED Provider Notes (Signed)
[place] DEPT Provider Note   CSN: [id number] Arrival date & time: 08/17/16  [id number]     History   Chief Complaint Chief Complaint  Patient presents with  . Shortness of Breath    HPI [name] is a 70 y.o. female with history of CHF, COPD, chronic atrial fibrillation who presents with worsening shortness of breath over the past 5 days.Patient was recently admitted for 2-1/2 weeks or S4 distress. She was discharged from [facility] 5 days ago. Patient has had worsening shortness of breath since. She states her shortness of breath is worse lying down and walking. She has also had associated left-sided chest pressure that is resolved by taking her blood pressure medication, carvedilol. Patient has had associated nausea. Patient also reports increased peripheral edema over the past few days which is now resolved. Patient reports continued dysuria, patient was treated for UTI while in edition place. Patient denies any abdominal pain, vomiting.  HPI  Past Medical History:  Diagnosis Date  . Anemia   . Angina   . Angioedema    2/2 ACE  . Arteriovenous malformation of stomach   . Arthritis   . Asthma   . AVM (arteriovenous malformation) of colon    small intestine; stomach  . Blood transfusion   . CHF (congestive heart failure) ([place])   . Complete heart block ([place])    s/p PPM 1998  . DDD (degenerative disc disease)   . Depression   . Diastolic heart failure   . Fatty liver 07/26/10  . GERD (gastroesophageal reflux disease)   . GI bleed   . History of alcohol abuse Stopped Fall 2012  . History of tobacco use Quit Fall 2012  . Hx of cardiovascular stress test    a. Lexiscan Myoview (10/15):  Small inferolateral and apical defect c/w scar and poss soft tissue attenuation, no ischemia, EF 42%  . Hx of colonic polyp 08/13/10   adenomatous  . Hx of colonoscopy   . Hyperlipidemia   . Hyperlipidemia   . Hypertension   . Hypertrophic cardiomyopathy ([place])    dx by Dr  [name] 2009  . Iron deficiency anemia   . Panic attack   . Panic attacks   . Permanent atrial fibrillation ([place])   . Renal failure    baseline creatinine 1.6  . Right arm pain 01/08/2012  . RLS (restless legs syndrome)    Dx 06/2007  . Shortness of breath    sob on exertation  . Sleep apnea     Patient Active Problem List   Diagnosis Date Noted  . Shortness of breath 08/17/2016  . Delirium   . Esophageal abnormality   . Endotracheal tube present   . Somnolence   . Respiratory failure ([place]) 07/11/2016  . Persistent atrial fibrillation ([place]) 07/10/2016  . Acute respiratory failure ([place])   . Hypotension due to drugs   . Demand ischemia ([place])   . Chest discomfort 02/05/2016  . COPD exacerbation ([place])   . Palpitations   . Chest pain 11/25/2015  . Dyspnea on exertion   . Polymyalgia rheumatica ([place]) 04/10/2015  . Low back pain 03/28/2015  . Hypertrophic cardiomyopathy ([place]) 02/15/2015  . Eczema 09/18/2014  . Environmental allergies 09/18/2014  . Pre-diabetes 11/26/2013  . COPD, moderate ([place]) 11/01/2013  . Chronic renal failure, stage 3 (moderate) 08/28/2012  . Preventative health care 08/18/2012  . Complete heart block ([place]) 04/03/2011  . Anemia 02/17/2011  . AVM (arteriovenous malformation) 02/13/2011  . PACEMAKER-[place] 11/28/2010  .  CHRONIC KIDNEY DISEASE STAGE III (MODERATE) 09/24/2010  . (HFpEF) heart failure with preserved ejection fraction ([place]) 08/30/2010  . ARTHRITIS 07/24/2010  . Generalized anxiety disorder 07/23/2010  . Chronic atrial fibrillation ([place]) 07/16/2010  . HLD (hyperlipidemia) 02/11/2007  . Hypertension 02/11/2007  . APNEA, SLEEP 02/11/2007    Past Surgical History:  Procedure Laterality Date  . CARDIAC CATHETERIZATION    . ESOPHAGOGASTRODUODENOSCOPY  12/23/2011   Procedure: ESOPHAGOGASTRODUODENOSCOPY (EGD);  Surgeon: [name], MD;  Location: [facility];  Service: Endoscopy;  Laterality: N/A;  . EXTUBATION (ENDOTRACHEAL) IN OR N/A  07/21/2016   Procedure: EXTUBATION (ENDOTRACHEAL) IN OR;  Surgeon: [name], MD;  Location: [facility];  Service: ENT;  Laterality: N/A;  . GIVENS CAPSULE STUDY  12/23/2011   Procedure: GIVENS CAPSULE STUDY;  Surgeon: [name], MD;  Location: [facility];  Service: Endoscopy;  Laterality: N/A;  . PACEMAKER INSERTION  1998   st jude, most recent gen change by [name] 4/12  . TUBAL LIGATION  04/01/2000    OB History    No data available       Home Medications    Prior to Admission medications   Medication Sig Start Date End Date Taking? Authorizing Provider  acetaminophen (TYLENOL) 325 MG tablet Take 325 mg by mouth daily as needed (pain).   Yes Historical Provider, MD  albuterol (PROVENTIL HFA;VENTOLIN HFA) 108 (90 Base) MCG/ACT inhaler Inhale 2 puffs into the lungs every 6 (six) hours as needed for wheezing or shortness of breath.   Yes Historical Provider, MD  ALPRAZolam Duanne Moron) 0.5 MG tablet Take 1 tablet (0.5 mg total) by mouth at bedtime as needed for anxiety. Patient taking differently: Take 0.25-0.5 mg by mouth at bedtime as needed for anxiety.  07/26/16  Yes Domenic Polite, MD  aspirin EC 81 MG tablet Take 81 mg by mouth daily.    Yes Historical Provider, MD  bisacodyl (DULCOLAX) 10 MG suppository Place 10 mg rectally. Take every other day as needed.   Yes Historical Provider, MD  CALCIUM PO Take 1 tablet by mouth once a week.   Yes Historical Provider, MD  ferrous sulfate 325 (65 FE) MG tablet TAKE 1 TABLET BY MOUTH TWICE DAILY WITH MEALS TO KEEP BLOOD COUNT UP Patient taking differently: Take 325 mg by mouth daily 08/03/14  Yes Leone Brand, MD  furosemide (LASIX) 20 MG tablet Take 1 tablet by mouth on Tues, Thurs, Sat and Sun 07/26/16  Yes Domenic Polite, MD  omeprazole (PRILOSEC) 20 MG capsule Take 20 mg by mouth daily.   Yes Historical Provider, MD  polyethylene glycol (MIRALAX / GLYCOLAX) packet Take 17 g by mouth daily. Patient taking differently: Take 17 g by mouth daily as  needed for moderate constipation.  07/26/16  Yes Domenic Polite, MD  predniSONE (DELTASONE) 1 MG tablet Take 5 tablets by mouth daily with breakfast 06/23/16  Yes Angela C Riccio, DO  rosuvastatin (CRESTOR) 20 MG tablet TAKE 1 TABLET BY MOUTH DAILY ON MONDAY, WEDNESDAY, AND FRIDAY 02/08/16  Yes Leone Brand, MD  senna (SENOKOT) 8.6 MG tablet Take 1 tablet by mouth at bedtime as needed for constipation.    Yes Historical Provider, MD  trolamine salicylate (ASPERCREME) 10 % cream Apply 1 application topically as needed for muscle pain.   Yes Historical Provider, MD  Trolamine Salicylate (QC ARTHRITIS EX) Apply 1 application topically as needed (for pain).   Yes Historical Provider, MD  albuterol (PROVENTIL) (2.5 MG/3ML) 0.083% nebulizer solution Take 3  mLs (2.5 mg total) by nebulization every 6 (six) hours as needed for wheezing or shortness of breath. 08/11/16   Dinah C Ngetich, NP  nitroGLYCERIN (NITROSTAT) 0.4 MG SL tablet Place 1 tablet (0.4 mg total) under the tongue every 5 (five) minutes as needed for chest pain. 01/18/15   Eileen Stanford, PA-C    Family History Family History  Problem Relation Age of Onset  . Hypertension Mother   . Stroke Mother   . Heart disease Mother   . Aneurysm Mother   . CVA Mother   . Hypertension Father   . Stroke Father   . Heart attack Father   . Alcohol abuse Father   . Hypertension Sister   . Hypertension Brother   . Colon cancer Brother   . Diabetes Sister   . Diabetes Brother   . Cancer Brother   . Heart attack Brother   . Heart disease Sister   . Alcohol abuse Brother     Social History Social History  Substance Use Topics  . Smoking status: Former Smoker    Packs/day: 0.10    Years: 50.00    Types: Cigarettes    Quit date: 08/16/2011  . Smokeless tobacco: Never Used     Comment: quit 2012  . Alcohol use 0.0 oz/week     Comment: quit 08/16/2011     Allergies   Ace inhibitors and Propofol   Review of Systems Review of Systems    Constitutional: Negative for chills and fever.  HENT: Negative for facial swelling and sore throat.   Respiratory: Positive for shortness of breath.   Cardiovascular: Positive for chest pain.  Gastrointestinal: Positive for nausea. Negative for abdominal pain and vomiting.  Genitourinary: Negative for dysuria.  Musculoskeletal: Negative for back pain.  Skin: Negative for rash and wound.  Neurological: Negative for headaches.  Psychiatric/Behavioral: The patient is not nervous/anxious.      Physical Exam Updated Vital Signs BP 145/85   Pulse 70   Temp 98.1 F (36.7 C) (Oral)   Resp 22   Ht 5\' 5"  (1.651 m)   Wt 79.4 kg   SpO2 99%   BMI 29.12 kg/m   Physical Exam  Constitutional: She appears well-developed and well-nourished. No distress.  HENT:  Head: Normocephalic and atraumatic.  Mouth/Throat: Oropharynx is clear and moist. No oropharyngeal exudate.  Eyes: Conjunctivae are normal. Pupils are equal, round, and reactive to light. Right eye exhibits no discharge. Left eye exhibits no discharge. No scleral icterus.  Neck: Normal range of motion. Neck supple. No thyromegaly present.  Cardiovascular: Normal rate, regular rhythm, normal heart sounds and intact distal pulses.  Exam reveals no gallop and no friction rub.   No murmur heard. Pulmonary/Chest: Effort normal. No stridor. No respiratory distress. She has wheezes (inspiratory). She has rhonchi (diffuse). She has no rales.  Abdominal: Soft. Bowel sounds are normal. She exhibits no distension. There is no tenderness. There is no rebound and no guarding.  Musculoskeletal: She exhibits no edema.  Lymphadenopathy:    She has no cervical adenopathy.  Neurological: She is alert. Coordination normal.  Skin: Skin is warm and dry. No rash noted. She is not diaphoretic. No pallor.  Psychiatric: She has a normal mood and affect.  Nursing note and vitals reviewed.    ED Treatments / Results  Labs (all labs ordered are listed,  but only abnormal results are displayed) Labs Reviewed  BASIC METABOLIC PANEL - Abnormal; Notable for the following:  Result Value   Glucose, Bld 111 (*)    Creatinine, Ser 1.15 (*)    GFR calc non Af Amer 46 (*)    GFR calc Af Amer 54 (*)    All other components within normal limits  CBC - Abnormal; Notable for the following:    RBC 3.38 (*)    Hemoglobin 8.8 (*)    HCT 29.6 (*)    MCHC 29.7 (*)    RDW 16.9 (*)    All other components within normal limits  BRAIN NATRIURETIC PEPTIDE - Abnormal; Notable for the following:    B Natriuretic Peptide 466.6 (*)    All other components within normal limits  URINALYSIS, ROUTINE W REFLEX MICROSCOPIC (NOT AT [facility]) - Abnormal; Notable for the following:    Protein, ur 100 (*)    Leukocytes, UA SMALL (*)    All other components within normal limits  URINE MICROSCOPIC-ADD ON - Abnormal; Notable for the following:    Squamous Epithelial / LPF 0-5 (*)    All other components within normal limits  I-STAT TROPOININ, ED - Abnormal; Notable for the following:    Troponin i, poc 0.40 (*)    All other components within normal limits  I-STAT TROPOININ, ED - Abnormal; Notable for the following:    Troponin i, poc 0.35 (*)    All other components within normal limits    EKG  EKG Interpretation  Date/Time:  Sunday August 17 2016 10:19:33 EDT Ventricular Rate:  70 PR Interval:    QRS Duration: 209 QT Interval:  537 QTC Calculation: 580 R Axis:   -86 Text Interpretation:  Sinus rhythm Short PR interval Probable left atrial enlargement LVH with IVCD, LAD and secondary repol abnrm Prolonged QT interval No significant change since last tracing Confirmed by LITTLE MD, RACHEL [phone]) on 08/17/2016 11:30:05 AM       Radiology Dg Chest 2 View  Result Date: 08/17/2016 CLINICAL DATA:  Shortness of breath with cough for 3 weeks EXAM: CHEST  2 VIEW COMPARISON:  July 19, 2016 FINDINGS: The heart size and mediastinal contours are stable. The heart  size is enlarged. Cardiac pacemaker is unchanged. There is pulmonary edema. There is no focal pneumonia or pleural effusion. The visualized skeletal structures are stable. IMPRESSION: Congestive heart failure. Electronically Signed   By: Abelardo Diesel M.D.   On: 08/17/2016 11:02    Procedures Procedures (including critical care time)  Medications Ordered in ED Medications  aspirin tablet 325 mg (325 mg Oral Given 08/17/16 1101)  ipratropium-albuterol (DUONEB) 0.5-2.5 (3) MG/3ML nebulizer solution 3 mL (not administered)  furosemide (LASIX) injection 40 mg (not administered)  albuterol (PROVENTIL) (2.5 MG/3ML) 0.083% nebulizer solution 2.5 mg (2.5 mg Nebulization Given 08/17/16 1102)     Initial Impression / Assessment and Plan / ED Course  I have reviewed the triage vital signs and the nursing notes.  Pertinent labs & imaging results that were available during my care of the patient were reviewed by me and considered in my medical decision making (see chart for details).  Clinical Course    Aspirin, Lasix 40 mg ordered in the ED. Albuterol, DuoNeb ordered in the ED. Patient in no respiratory distress. CBC shows anemia, hemoglobin 8.8. BMP shows glucose 111, creatinine 1.15. Patient with baseline elevated troponin; initial 0.40, repeat 0.35. BNP 466.6. CXR shows congestive heart failure. EKG shows Ventricular-paced rhythm; No significant change since last tracing. UA shows 100 protein, small leukocytes, no bacteria. I consulted family medicine who will  admit the patient for further evaluation and treatment. Transfer care to Dr. Ree Kida. Patient also evaluated by Dr. Rex Kras who guided the patient's management and agrees with plan.  Final Clinical Impressions(s) / ED Diagnoses   Final diagnoses:  SOB (shortness of breath)  Acute on chronic congestive heart failure, unspecified congestive heart failure type ([place])  Chest pain, unspecified chest pain type    New Prescriptions Current Discharge  Medication List       Frederica Kuster, Hershal Coria 08/17/16 [place], MD 08/18/16 1610

## 2016-08-18 DIAGNOSIS — E785 Hyperlipidemia, unspecified: Secondary | ICD-10-CM | POA: Diagnosis not present

## 2016-08-18 DIAGNOSIS — Z950 Presence of cardiac pacemaker: Secondary | ICD-10-CM | POA: Diagnosis not present

## 2016-08-18 DIAGNOSIS — I4891 Unspecified atrial fibrillation: Secondary | ICD-10-CM | POA: Diagnosis not present

## 2016-08-18 DIAGNOSIS — G4730 Sleep apnea, unspecified: Secondary | ICD-10-CM | POA: Diagnosis not present

## 2016-08-18 DIAGNOSIS — R7989 Other specified abnormal findings of blood chemistry: Secondary | ICD-10-CM | POA: Diagnosis not present

## 2016-08-18 DIAGNOSIS — I509 Heart failure, unspecified: Secondary | ICD-10-CM | POA: Diagnosis not present

## 2016-08-18 DIAGNOSIS — I5033 Acute on chronic diastolic (congestive) heart failure: Secondary | ICD-10-CM | POA: Diagnosis not present

## 2016-08-18 DIAGNOSIS — R0600 Dyspnea, unspecified: Secondary | ICD-10-CM | POA: Diagnosis not present

## 2016-08-18 DIAGNOSIS — R778 Other specified abnormalities of plasma proteins: Secondary | ICD-10-CM

## 2016-08-18 DIAGNOSIS — N179 Acute kidney failure, unspecified: Secondary | ICD-10-CM | POA: Diagnosis not present

## 2016-08-18 DIAGNOSIS — R0602 Shortness of breath: Secondary | ICD-10-CM

## 2016-08-18 DIAGNOSIS — Z7982 Long term (current) use of aspirin: Secondary | ICD-10-CM | POA: Diagnosis not present

## 2016-08-18 DIAGNOSIS — I481 Persistent atrial fibrillation: Secondary | ICD-10-CM | POA: Diagnosis not present

## 2016-08-18 DIAGNOSIS — I482 Chronic atrial fibrillation: Secondary | ICD-10-CM | POA: Diagnosis not present

## 2016-08-18 DIAGNOSIS — I422 Other hypertrophic cardiomyopathy: Secondary | ICD-10-CM | POA: Diagnosis not present

## 2016-08-18 DIAGNOSIS — I10 Essential (primary) hypertension: Secondary | ICD-10-CM

## 2016-08-18 DIAGNOSIS — I272 Other secondary pulmonary hypertension: Secondary | ICD-10-CM | POA: Diagnosis not present

## 2016-08-18 DIAGNOSIS — I248 Other forms of acute ischemic heart disease: Secondary | ICD-10-CM | POA: Diagnosis not present

## 2016-08-18 DIAGNOSIS — I130 Hypertensive heart and chronic kidney disease with heart failure and stage 1 through stage 4 chronic kidney disease, or unspecified chronic kidney disease: Secondary | ICD-10-CM | POA: Diagnosis not present

## 2016-08-18 DIAGNOSIS — J449 Chronic obstructive pulmonary disease, unspecified: Secondary | ICD-10-CM | POA: Diagnosis not present

## 2016-08-18 DIAGNOSIS — G2581 Restless legs syndrome: Secondary | ICD-10-CM | POA: Diagnosis not present

## 2016-08-18 DIAGNOSIS — Z7952 Long term (current) use of systemic steroids: Secondary | ICD-10-CM | POA: Diagnosis not present

## 2016-08-18 DIAGNOSIS — N183 Chronic kidney disease, stage 3 (moderate): Secondary | ICD-10-CM | POA: Diagnosis not present

## 2016-08-18 DIAGNOSIS — M353 Polymyalgia rheumatica: Secondary | ICD-10-CM | POA: Diagnosis not present

## 2016-08-18 DIAGNOSIS — L309 Dermatitis, unspecified: Secondary | ICD-10-CM | POA: Diagnosis not present

## 2016-08-18 LAB — CBC
HEMATOCRIT: 29.9 % — AB (ref 36.0–46.0)
HEMOGLOBIN: 8.7 g/dL — AB (ref 12.0–15.0)
MCH: 25.4 pg — AB (ref 26.0–34.0)
MCHC: 29.1 g/dL — ABNORMAL LOW (ref 30.0–36.0)
MCV: 87.4 fL (ref 78.0–100.0)
Platelets: 234 10*3/uL (ref 150–400)
RBC: 3.42 MIL/uL — AB (ref 3.87–5.11)
RDW: 17.1 % — ABNORMAL HIGH (ref 11.5–15.5)
WBC: 4.6 10*3/uL (ref 4.0–10.5)

## 2016-08-18 LAB — BASIC METABOLIC PANEL
ANION GAP: 9 (ref 5–15)
BUN: 12 mg/dL (ref 6–20)
CHLORIDE: 104 mmol/L (ref 101–111)
CO2: 29 mmol/L (ref 22–32)
Calcium: 9.5 mg/dL (ref 8.9–10.3)
Creatinine, Ser: 1.14 mg/dL — ABNORMAL HIGH (ref 0.44–1.00)
GFR calc non Af Amer: 47 mL/min — ABNORMAL LOW (ref >60)
GFR, EST AFRICAN AMERICAN: 54 mL/min — AB (ref >60)
GLUCOSE: 100 mg/dL — AB (ref 65–99)
POTASSIUM: 3.4 mmol/L — AB (ref 3.5–5.1)
Sodium: 142 mmol/L (ref 135–145)

## 2016-08-18 LAB — TROPONIN I
Troponin I: 0.47 ng/mL (ref <0.03)
Troponin I: 0.45 ng/mL (ref <0.03)

## 2016-08-18 MED ORDER — SENNA 8.6 MG PO TABS
2.0000 | ORAL_TABLET | Freq: Once | ORAL | Status: AC
  Administered 2016-08-18: 17.2 mg via ORAL
  Filled 2016-08-18: qty 2

## 2016-08-18 MED ORDER — PANTOPRAZOLE SODIUM 40 MG PO TBEC
40.0000 mg | DELAYED_RELEASE_TABLET | Freq: Every day | ORAL | Status: DC
  Administered 2016-08-18 – 2016-08-21 (×4): 40 mg via ORAL
  Filled 2016-08-18 (×4): qty 1

## 2016-08-18 MED ORDER — ALBUTEROL SULFATE (2.5 MG/3ML) 0.083% IN NEBU: 3.0000 mL | INHALATION_SOLUTION | Freq: Four times a day (QID) | RESPIRATORY_TRACT | Status: DC | PRN

## 2016-08-18 MED ORDER — ALBUTEROL SULFATE (2.5 MG/3ML) 0.083% IN NEBU: 2.5000 mg | INHALATION_SOLUTION | Freq: Four times a day (QID) | RESPIRATORY_TRACT | Status: DC | PRN

## 2016-08-18 MED ORDER — FUROSEMIDE 20 MG PO TABS
20.0000 mg | ORAL_TABLET | Freq: Every day | ORAL | Status: DC
  Administered 2016-08-18 – 2016-08-19 (×2): 20 mg via ORAL
  Filled 2016-08-18 (×3): qty 1

## 2016-08-18 MED ORDER — DOCUSATE SODIUM 100 MG PO CAPS
100.0000 mg | ORAL_CAPSULE | Freq: Two times a day (BID) | ORAL | Status: DC
  Administered 2016-08-18 – 2016-08-21 (×7): 100 mg via ORAL
  Filled 2016-08-18 (×7): qty 1

## 2016-08-18 MED ORDER — POTASSIUM CHLORIDE CRYS ER 10 MEQ PO TBCR
30.0000 meq | EXTENDED_RELEASE_TABLET | Freq: Two times a day (BID) | ORAL | Status: AC
  Administered 2016-08-18 (×2): 30 meq via ORAL
  Filled 2016-08-18 (×2): qty 1

## 2016-08-18 MED ORDER — IPRATROPIUM-ALBUTEROL 0.5-2.5 (3) MG/3ML IN SOLN: 3.0000 mL | Freq: Four times a day (QID) | RESPIRATORY_TRACT | Status: DC | PRN

## 2016-08-18 MED ORDER — ROSUVASTATIN CALCIUM 20 MG PO TABS
20.0000 mg | ORAL_TABLET | ORAL | Status: DC
  Administered 2016-08-18 – 2016-08-20 (×2): 20 mg via ORAL
  Filled 2016-08-18 (×2): qty 1

## 2016-08-18 MED ORDER — PREDNISONE 50 MG PO TABS
50.0000 mg | ORAL_TABLET | Freq: Every day | ORAL | Status: DC
  Administered 2016-08-18 – 2016-08-21 (×4): 50 mg via ORAL
  Filled 2016-08-18 (×4): qty 1

## 2016-08-18 NOTE — Care Management Note (Addendum)
Case Management Note  Patient Details  Name: [name] MRN: [id number] Date of Birth: Jan 03, 1946  Subjective/Objective:        Patient states her daughter is living with her for a while right now, she was just in a SNF recently.  She has a rolling walker at home that she uses. She has a pcp, and medication coverage and transportation at discharge.  Patient states she is confused about organizing her meds at home and wanted to know if we could help her with that.  NCM gave her the [place] list to choose from for a [facility] to help with medication management, she states she will need to speak with her daughter [name] to see who she would like for her to choose .  NCM will check back with her later today.     71 NCM spoke with daughter [name] who was in the room, NCM asked if they had decided on the agency they wanted to work with, [name] states they her other sister will have to make that decision , her name is [name], she states they will know by later this evening or tomorrow.  NCM will cont to follow for dc needs.          Action/Plan:   Expected Discharge Date:                  Expected Discharge Plan:  [place]  In-House Referral:     Discharge planning Services  CM Consult  Post Acute Care Choice:    Choice offered to:     DME Arranged:    DME Agency:     HH Arranged:    [place] Agency:     Status of Service:  In process, will continue to follow  If discussed at Long Length of Stay Meetings, dates discussed:    Additional Comments:  [name], RN 08/18/2016, 10:46 AM

## 2016-08-18 NOTE — Evaluation (Signed)
Physical Therapy Evaluation Patient Details Name: Jaclynn Zelle MRN: [id number] DOB: 1946-11-06 Today's Date: 08/18/2016   History of Present Illness  [name] is a 70 y.o. female presenting with dyspnea. PMH is significant for recent ICU stay (7/27-8/7), difficult airway, afib without anticoag 2/2 GI bleeds, pacemaker, COPD, CHF (20-25%, non obstructive HOCM, pacer leads intact), HLD, GAD, HTN, CKDIII.  Clinical Impression   Pt admitted with above diagnosis. Pt currently with functional limitations due to the deficits listed below (see PT Problem List).  Pt will benefit from skilled PT to increase their independence and safety with mobility to allow discharge to the venue listed below.       Follow Up Recommendations No PT follow up  Worth considering Cardiac Rehab Phase 1 while in-hospital, and Cardiac Rehab Phase 2 as Outpatient    Equipment Recommendations  None recommended by PT    Recommendations for Other Services       Precautions / Restrictions Precautions Precaution Comments: DOE      Mobility  Bed Mobility Overal bed mobility: Modified Independent                Transfers Overall transfer level: Modified independent                  Ambulation/Gait Ambulation/Gait assistance: Supervision Ambulation Distance (Feet): 120 Feet Assistive device:  (pushing dinamap) Gait Pattern/deviations: Decreased step length - right;Decreased step length - left;Decreased stride length     General Gait Details: Cues to self-monitor for activity tolerance; noted DOE3/4 during walk; O2 sats remained greater tahn or equal to 94% on Room Air  Stairs            Wheelchair Mobility    Modified Rankin (Stroke Patients Only)       Balance                                             Pertinent Vitals/Pain Pain Assessment: No/denies pain    Home Living Family/patient expects to be discharged to:: Private  residence Living Arrangements: Children Available Help at Discharge: Family;Available PRN/intermittently Type of Home: House Home Access: Stairs to enter Entrance Stairs-Rails: Right Entrance Stairs-Number of Steps: 4 or 13 steps Home Layout: One level Home Equipment: Walker - 2 wheels;Bedside commode      Prior Function Level of Independence: Independent         Comments: Drives,cooks, cleans. Pt is home alone during the day.     Hand Dominance        Extremity/Trunk Assessment   Upper Extremity Assessment: Overall WFL for tasks assessed           Lower Extremity Assessment: Overall WFL for tasks assessed         Communication   Communication: No difficulties  Cognition Arousal/Alertness: Awake/alert Behavior During Therapy: WFL for tasks assessed/performed Overall Cognitive Status: Within Functional Limits for tasks assessed                      General Comments      Exercises        Assessment/Plan    PT Assessment Patient needs continued PT services  PT Diagnosis Difficulty walking   PT Problem List Decreased activity tolerance;Decreased knowledge of use of DME;Cardiopulmonary status limiting activity  PT Treatment Interventions DME instruction;Gait training;Stair training;Functional mobility training;Therapeutic activities;Therapeutic exercise;Patient/family education  PT Goals (Current goals can be found in the Care Plan section) Acute Rehab PT Goals Patient Stated Goal: Walk without SOB PT Goal Formulation: With patient Time For Goal Achievement: 09/01/16 Potential to Achieve Goals: Good    Frequency Min 3X/week   Barriers to discharge        Co-evaluation               End of Session Equipment Utilized During Treatment: Gait belt Activity Tolerance: Patient tolerated treatment well Patient left: in bed;with call bell/phone within reach (sitting EOB) Nurse Communication: Mobility status         Time:  [id number] PT Time Calculation (min) (ACUTE ONLY): 17 min   Charges:   PT Evaluation $PT Eval Moderate Complexity: 1 Procedure     PT G CodesQuin Hoop 08/18/2016, 4:25 PM  Roney Marion, [place] Pager [phone] Office [phone]

## 2016-08-18 NOTE — Consult Note (Signed)
Primary cardiologist: Dr Jonelle Sidle [place]/Dr [place] cardiologist: Dr Carlyle Dolly  Clinical Summary [name] is a 70 y.o.female history of HTN, hyperlipidemia, CKD III, chronic afib (no anticoag due to GI bleed), chronic diastolic HF, heart block with St Jude pacemaker, HCM without dynamic obstruction,  COPD, pulm HTN by echo,  chronic diastolic HF, admitted with dyspnea.    Admit 01/2016 with atypical chest pain, trop around 0.4 at that time that was flat. Managed medically due to normal nuclear stress 2 months prior, suspected small vessel disease and possible subendocardial ischemia from her HCM. Looking back she had similar admission 11/2015 as well. Recent admission 06/2016 with respiratory complications after TEE for evaluation for possible Watchman device, have acute BiV stunning with drop in LVEF that normalized.    06/2016 echo: LVEF 55-60%, moderate asymmetric septal hypertrophy,  11/2015 Nuclear stress: no significant ischemia EKG V-paced Cr 1.15 (GFR 46), K 3.7, BNP 466, Trop 0.43-->0.47-->0.45 CXR +pulmonary edema   Allergies  Allergen Reactions  . Ace Inhibitors Other (See Comments)    Angioedema  . Propofol Anaphylaxis and Swelling    Medications Scheduled Medications: . aspirin EC  81 mg Oral Daily  . docusate sodium  100 mg Oral BID  . enoxaparin (LOVENOX) injection  40 mg Subcutaneous Q24H  . ferrous sulfate  325 mg Oral BID WC  . furosemide  20 mg Oral Daily  . pantoprazole  40 mg Oral Daily  . potassium chloride  30 mEq Oral BID  . predniSONE  50 mg Oral Q breakfast  . rosuvastatin  20 mg Oral Once per day on Mon Wed Fri     Infusions:     PRN Medications:  acetaminophen **OR** acetaminophen, ALPRAZolam, hydrALAZINE, ipratropium-albuterol, nitroGLYCERIN, polyethylene glycol   Past Medical History:  Diagnosis Date  . Anemia   . Angina   . Angioedema    2/2 ACE  . Arteriovenous malformation of stomach   .  Arthritis   . Asthma   . AVM (arteriovenous malformation) of colon    small intestine; stomach  . Blood transfusion   . CHF (congestive heart failure) ([place])   . Complete heart block ([place])    s/p PPM 1998  . DDD (degenerative disc disease)   . Depression   . Diastolic heart failure   . Fatty liver 07/26/10  . GERD (gastroesophageal reflux disease)   . GI bleed   . History of alcohol abuse Stopped Fall 2012  . History of tobacco use Quit Fall 2012  . Hx of cardiovascular stress test    a. Lexiscan Myoview (10/15):  Small inferolateral and apical defect c/w scar and poss soft tissue attenuation, no ischemia, EF 42%  . Hx of colonic polyp 08/13/10   adenomatous  . Hx of colonoscopy   . Hyperlipidemia   . Hyperlipidemia   . Hypertension   . Hypertrophic cardiomyopathy ([place])    dx by Dr [name] 2009  . Iron deficiency anemia   . Panic attack   . Panic attacks   . Permanent atrial fibrillation ([place])   . Renal failure    baseline creatinine 1.6  . Right arm pain 01/08/2012  . RLS (restless legs syndrome)    Dx 06/2007  . Shortness of breath    sob on exertation  . Sleep apnea     Past Surgical History:  Procedure Laterality Date  . CARDIAC CATHETERIZATION    . ESOPHAGOGASTRODUODENOSCOPY  12/23/2011   Procedure: ESOPHAGOGASTRODUODENOSCOPY (EGD);  Surgeon: Sydell Axon  Andris Baumann, MD;  Location: [facility];  Service: Endoscopy;  Laterality: N/A;  . EXTUBATION (ENDOTRACHEAL) IN OR N/A 07/21/2016   Procedure: EXTUBATION (ENDOTRACHEAL) IN OR;  Surgeon: [name], MD;  Location: [facility];  Service: ENT;  Laterality: N/A;  . GIVENS CAPSULE STUDY  12/23/2011   Procedure: GIVENS CAPSULE STUDY;  Surgeon: [name], MD;  Location: [facility];  Service: Endoscopy;  Laterality: N/A;  . PACEMAKER INSERTION  1998   st jude, most recent gen change by [name] 4/12  . TUBAL LIGATION  04/01/2000    Family History  Problem Relation Age of Onset  . Hypertension Mother   . Stroke Mother   . Heart disease  Mother   . Aneurysm Mother   . CVA Mother   . Hypertension Father   . Stroke Father   . Heart attack Father   . Alcohol abuse Father   . Hypertension Sister   . Hypertension Brother   . Colon cancer Brother   . Diabetes Sister   . Diabetes Brother   . Cancer Brother   . Heart attack Brother   . Heart disease Sister   . Alcohol abuse Brother     Social History [name] reports that she quit smoking about 5 years ago. Her smoking use included Cigarettes. She has a 5.00 pack-year smoking history. She has never used smokeless tobacco. [name] reports that she drinks alcohol.  Review of Systems CONSTITUTIONAL: No weight loss, fever, chills, weakness or fatigue.  HEENT: Eyes: No visual loss, blurred vision, double vision or yellow sclerae. No hearing loss, sneezing, congestion, runny nose or sore throat.  SKIN: No rash or itching.  CARDIOVASCULAR: per HPI RESPIRATORY: No shortness of breath, cough or sputum.  GASTROINTESTINAL: No anorexia, nausea, vomiting or diarrhea. No abdominal pain or blood.  GENITOURINARY: no polyuria, no dysuria NEUROLOGICAL: No headache, dizziness, syncope, paralysis, ataxia, numbness or tingling in the extremities. No change in bowel or bladder control.  MUSCULOSKELETAL: No muscle, back pain, joint pain or stiffness.  HEMATOLOGIC: No anemia, bleeding or bruising.  LYMPHATICS: No enlarged nodes. No history of splenectomy.  PSYCHIATRIC: No history of depression or anxiety.      Physical Examination Blood pressure (!) 103/49, pulse 70, temperature 98.4 F (36.9 C), temperature source Oral, resp. rate 18, height 5\' 5"  (1.651 m), weight 169 lb 11.2 oz (77 kg), SpO2 100 %.  Intake/Output Summary (Last 24 hours) at 08/18/16 1443 Last data filed at 08/18/16 1231  Gross per 24 hour  Intake              600 ml  Output              600 ml  Net                0 ml    HEENT: sclera clear, throat clear  Cardiovascular: RRR, no m/r/g, no  jvd  Respiratory: abdomen soft, NT, ND  GI: no LE edema  MSK: no LE edema  Neuro: no focal deficits  Psych: appropriate affect   Lab Results  Basic Metabolic Panel:  Recent Labs Lab 08/17/16 0959 08/18/16 0232  NA 139 142  K 3.7 3.4*  CL 108 104  CO2 25 29  GLUCOSE 111* 100*  BUN 17 12  CREATININE 1.15* 1.14*  CALCIUM 9.5 9.5    Liver Function Tests: No results for input(s): AST, ALT, ALKPHOS, BILITOT, PROT, ALBUMIN in the last 168 hours.  CBC:  Recent Labs Lab  08/17/16 0959 08/18/16 0232  WBC 5.3 4.6  HGB 8.8* 8.7*  HCT 29.6* 29.9*  MCV 87.6 87.4  PLT 227 234    Cardiac Enzymes:  Recent Labs Lab 08/17/16 1815 08/18/16 0040 08/18/16 0232  TROPONINI 0.43* 0.47* 0.45*    BNP: Invalid input(s): POCBNP     Impression/Recommendations 1. Dyspnea - admit 12/206, 01/2016, 07/2016 with dyspnea and  flat troponin ranging 0.2-0.45. Nuclear stress 11/2015 without significant ischemia. Given ongoing symptoms of dyspnea and chest pain (though chest pain is atypical), concern remains for possible obstructive CAD and false negative stress test. Ongoing dyspnea x 1 year.  - discussed with her possible LHC/RHC for defintive evaluation for obstructive CAD, as well as measurement of filling pressures and PA pressures in setting of her significant dyspnea. Her renal function is quite improved from her previous admissions and cath could be conisdered. At this time she is unsure if she wishes to pursue and will discuss with her family and let us know. Continue oral diuretics, if goes for cath can directly measure filling pressures, if does not go for cath would lean toward increasing her diuretic regimen.      Carlyle Dolly, M.D.

## 2016-08-18 NOTE — Progress Notes (Signed)
1730 No wheezing appreciated. Diminished breath sounds on lower lobes  Sob noted pulse ox on room air remains @ 99 to 100 %

## 2016-08-18 NOTE — Care Management Obs Status (Signed)
[facility] NOTIFICATION   Patient Details  Name: [name] MRN: [id number] Date of Birth: Jul 01, 1946   Medicare Observation Status Notification Given:  Yes    [name], RN 08/18/2016, 10:36 AM

## 2016-08-18 NOTE — Progress Notes (Signed)
Family Medicine Teaching Service Daily Progress Note Intern Pager: [phone]  Patient name: [name] Medical record number: [id number] Date of birth: 04-10-1946 Age: 70 y.o. Gender: female  Primary Care Provider: Steve Rattler, DO Consultants: none Code Status: FULL  Pt Overview and Major Events to Date:  9/3: Admitted for dyspnea.   Assessment and Plan: [name] is a 70 y.o. female presenting with dyspnea. PMH is significant for recent ICU stay (7/27-8/7), difficult airway, afib without anticoag 2/2 GI bleeds, pacemaker, COPD, CHF (20-25%, non obstructive HOCM, pacer leads intact), HLD, GAD, HTN, CKDIII.   Dyspnea: Patient was intubated last month. Numerous notes stating the patient has a difficult airway. Currently dyspnea include some audible upper airway wheezes. Lung fields clear bilaterally. No crackles or rhonchi. Etiology likely upper airway irritation versus possible infection. No sputum production. - Continuous pulse ox. - Supplemental O2 as needed. - Prednisone 50 mg daily 5 days. - Consider racemic epinephrine if respiratory status worsens - Duonebs (every 6 hours when necessary)  Suspected CHF exacerbation: Trop 0.4, EKG unchanged from previous. CXR consistent with pulmonary edema (relatively underwhelming). She has no peripheral edema. BNP elevated to 466.6 (most recently 144; 02/05/16 >> 436; 11/25/2015). Etiology deemed likely CHF exacerbation due to radiology CXR read and elevated BNP. That said, weight is only up 1 pound since discharge on 07/10/16, and no crackles on exam. COPD exacerbation is unlikely due to benign lung exam. Known pulmonary hypertension with a pressure of 62 on previous echo. Cannot rule out PE at this time. -consulted cards (Dr. Radford Pax). She is not impressed with her troponin given history of CHF -Low threshold for CCM consult given history of intubation and difficult airway -trend trops >> stable (~0.45) -EKG in AM or PRN  chest pain -lasix 40mg  IV once >> down 326ml from admission; still does not appear overloaded on exam.  -strict I&Os  -daily weights -PT eval  Afib: CHADsVASC score 4. Also history of AVB with pacemaker. She is being paced now. Not further anticoagulated secondary to history of ?GI bleeds.  -continue home ASA 81mg   COPD: Only on albuterol at home. Consider adding additional controller meds. No home O2.  -duonebs q6H PRN -Oxygen as needed -Low threshold for CCM consult given history of intubation and difficult airway  GAD: Home meds include xanax 0.5mg  QHS.  -continue home xanax  HTN: Hypertensive on admission. No home BP meds, norvasc was d/c'd on discharge 07/26/16. -PRN hydralazine SBP>160/DBP>110 -monitor BP   Anemia: Hemoglobin 8.7 (baseline 9-10), MCV 87.6 suggesting anemia of chronic disease. RDW of 17 >> likely Retic. No recent anemia panel. She has history of GI bleed. No recent colonoscopy on file also she had capsule endoscopy in 2013 for GI bleed -continue home iron -Monitor CBCs  Constipation: reports BM at home every 3 days with occasional suppository use.  -miralax daily PRN  FEN/GI: heart healthy carb mod  Prophylaxis: lovenox  Disposition: pending medical improvement  Subjective:  No issues overnight. Continues to have wheezing and dyspnea. Some improvement from yesterday but not significant.   Objective: Temp:  [98.3 F (36.8 C)-99.5 F (37.5 C)] 98.8 F (37.1 C) (09/04 0531) Pulse Rate:  [70-76] 70 (09/04 0531) Resp:  [17-28] 20 (09/03 1646) BP: (112-162)/(60-98) 129/83 (09/04 0531) SpO2:  [94 %-100 %] 94 % (09/04 0531) Weight:  [169 lb 11.2 oz (77 kg)-171 lb 4.8 oz (77.7 kg)] 169 lb 11.2 oz (77 kg) (09/04 0531) Physical Exam: General: NAD, alert, oriented, in bed. HEENT: PERRLA,  EOMI, MMM, OP clear with +2 tonsillar adenitis (R>L), neck full ROM, JVD difficult to assess secondary to adipose tissue, no LAD on palpation Cardiovascular: rrr, no  m/r/g, no edema bilaterally, dorsalis pedis pulses 2+ bilaterally Respiratory: CTAB, no wheezes or crackles. Notable upper airway wheezing. Neuro: CN II-XII grossly intact  Laboratory:  Recent Labs Lab 08/17/16 0959 08/18/16 0232  WBC 5.3 4.6  HGB 8.8* 8.7*  HCT 29.6* 29.9*  PLT 227 234    Recent Labs Lab 08/17/16 0959 08/18/16 0232  NA 139 142  K 3.7 3.4*  CL 108 104  CO2 25 29  BUN 17 12  CREATININE 1.15* 1.14*  CALCIUM 9.5 9.5  GLUCOSE 111* 100*    Imaging/Diagnostic Tests: CXR 9/3/17IMPRESSION: Congestive heart failure.   Elberta Leatherwood, MD 08/18/2016, 11:39 AM PGY-3, [facility] Intern pager: [phone], text pages welcome

## 2016-08-19 ENCOUNTER — Encounter (HOSPITAL_COMMUNITY)
Admission: EM | Disposition: A | Discharge status: Home Health | Payer: Self-pay | Source: Home / Self Care | Attending: Family Medicine

## 2016-08-19 ENCOUNTER — Encounter (HOSPITAL_COMMUNITY): Payer: Self-pay | Admitting: Internal Medicine

## 2016-08-19 DIAGNOSIS — I482 Chronic atrial fibrillation: Secondary | ICD-10-CM | POA: Diagnosis not present

## 2016-08-19 DIAGNOSIS — J449 Chronic obstructive pulmonary disease, unspecified: Secondary | ICD-10-CM | POA: Diagnosis not present

## 2016-08-19 DIAGNOSIS — R0600 Dyspnea, unspecified: Secondary | ICD-10-CM | POA: Diagnosis not present

## 2016-08-19 DIAGNOSIS — Z7982 Long term (current) use of aspirin: Secondary | ICD-10-CM | POA: Diagnosis not present

## 2016-08-19 DIAGNOSIS — R0602 Shortness of breath: Secondary | ICD-10-CM | POA: Diagnosis not present

## 2016-08-19 DIAGNOSIS — G4730 Sleep apnea, unspecified: Secondary | ICD-10-CM | POA: Diagnosis not present

## 2016-08-19 DIAGNOSIS — I272 Other secondary pulmonary hypertension: Secondary | ICD-10-CM | POA: Diagnosis not present

## 2016-08-19 DIAGNOSIS — Z950 Presence of cardiac pacemaker: Secondary | ICD-10-CM | POA: Diagnosis not present

## 2016-08-19 DIAGNOSIS — L309 Dermatitis, unspecified: Secondary | ICD-10-CM | POA: Diagnosis not present

## 2016-08-19 DIAGNOSIS — N179 Acute kidney failure, unspecified: Secondary | ICD-10-CM | POA: Diagnosis not present

## 2016-08-19 DIAGNOSIS — I214 Non-ST elevation (NSTEMI) myocardial infarction: Secondary | ICD-10-CM

## 2016-08-19 DIAGNOSIS — I130 Hypertensive heart and chronic kidney disease with heart failure and stage 1 through stage 4 chronic kidney disease, or unspecified chronic kidney disease: Secondary | ICD-10-CM | POA: Diagnosis not present

## 2016-08-19 DIAGNOSIS — N183 Chronic kidney disease, stage 3 (moderate): Secondary | ICD-10-CM | POA: Diagnosis not present

## 2016-08-19 DIAGNOSIS — E785 Hyperlipidemia, unspecified: Secondary | ICD-10-CM | POA: Diagnosis not present

## 2016-08-19 DIAGNOSIS — I248 Other forms of acute ischemic heart disease: Secondary | ICD-10-CM | POA: Diagnosis not present

## 2016-08-19 DIAGNOSIS — M353 Polymyalgia rheumatica: Secondary | ICD-10-CM | POA: Diagnosis not present

## 2016-08-19 DIAGNOSIS — I4891 Unspecified atrial fibrillation: Secondary | ICD-10-CM | POA: Diagnosis not present

## 2016-08-19 DIAGNOSIS — I5033 Acute on chronic diastolic (congestive) heart failure: Secondary | ICD-10-CM | POA: Diagnosis not present

## 2016-08-19 DIAGNOSIS — G2581 Restless legs syndrome: Secondary | ICD-10-CM | POA: Diagnosis not present

## 2016-08-19 DIAGNOSIS — I422 Other hypertrophic cardiomyopathy: Secondary | ICD-10-CM | POA: Diagnosis not present

## 2016-08-19 DIAGNOSIS — I481 Persistent atrial fibrillation: Secondary | ICD-10-CM | POA: Diagnosis not present

## 2016-08-19 DIAGNOSIS — Z7952 Long term (current) use of systemic steroids: Secondary | ICD-10-CM | POA: Diagnosis not present

## 2016-08-19 HISTORY — PX: CARDIAC CATHETERIZATION: SHX172

## 2016-08-19 LAB — POCT I-STAT 3, VENOUS BLOOD GAS (G3P V)
Acid-Base Excess: 1 mmol/L (ref 0.0–2.0)
Acid-Base Excess: 3 mmol/L — ABNORMAL HIGH (ref 0.0–2.0)
Bicarbonate: 25.9 mmol/L (ref 20.0–28.0)
Bicarbonate: 27.4 mmol/L (ref 20.0–28.0)
O2 SAT: 59 %
O2 Saturation: 59 %
PCO2 VEN: 40.3 mmHg — AB (ref 44.0–60.0)
PCO2 VEN: 41.2 mmHg — AB (ref 44.0–60.0)
PH VEN: 7.415 (ref 7.250–7.430)
PO2 VEN: 30 mmHg — AB (ref 32.0–45.0)
TCO2: 27 mmol/L (ref 0–100)
TCO2: 29 mmol/L (ref 0–100)
pH, Ven: 7.431 — ABNORMAL HIGH (ref 7.250–7.430)
pO2, Ven: 30 mmHg — CL (ref 32.0–45.0)

## 2016-08-19 LAB — HEMOGLOBIN A1C
HEMOGLOBIN A1C: 7 % — AB (ref 4.8–5.6)
Mean Plasma Glucose: 154 mg/dL

## 2016-08-19 LAB — CBC
HEMATOCRIT: 32 % — AB (ref 36.0–46.0)
HEMOGLOBIN: 9.5 g/dL — AB (ref 12.0–15.0)
MCH: 25.9 pg — ABNORMAL LOW (ref 26.0–34.0)
MCHC: 29.7 g/dL — AB (ref 30.0–36.0)
MCV: 87.2 fL (ref 78.0–100.0)
Platelets: 289 10*3/uL (ref 150–400)
RBC: 3.67 MIL/uL — ABNORMAL LOW (ref 3.87–5.11)
RDW: 16.5 % — AB (ref 11.5–15.5)
WBC: 4.9 10*3/uL (ref 4.0–10.5)

## 2016-08-19 LAB — BASIC METABOLIC PANEL
ANION GAP: 7 (ref 5–15)
BUN: 19 mg/dL (ref 6–20)
CO2: 25 mmol/L (ref 22–32)
Calcium: 10 mg/dL (ref 8.9–10.3)
Chloride: 105 mmol/L (ref 101–111)
Creatinine, Ser: 1.16 mg/dL — ABNORMAL HIGH (ref 0.44–1.00)
GFR calc Af Amer: 53 mL/min — ABNORMAL LOW (ref >60)
GFR, EST NON AFRICAN AMERICAN: 46 mL/min — AB (ref >60)
Glucose, Bld: 149 mg/dL — ABNORMAL HIGH (ref 65–99)
POTASSIUM: 4.5 mmol/L (ref 3.5–5.1)
Sodium: 137 mmol/L (ref 135–145)

## 2016-08-19 LAB — POCT I-STAT 3, ART BLOOD GAS (G3+)
ACID-BASE EXCESS: 2 mmol/L (ref 0.0–2.0)
BICARBONATE: 25.8 mmol/L (ref 20.0–28.0)
O2 Saturation: 94 %
PH ART: 7.448 (ref 7.350–7.450)
PO2 ART: 68 mmHg — AB (ref 83.0–108.0)
TCO2: 27 mmol/L (ref 0–100)
pCO2 arterial: 37.3 mmHg (ref 32.0–48.0)

## 2016-08-19 LAB — BRAIN NATRIURETIC PEPTIDE: B Natriuretic Peptide: 459.6 pg/mL — ABNORMAL HIGH (ref 0.0–100.0)

## 2016-08-19 SURGERY — RIGHT/LEFT HEART CATH AND CORONARY ANGIOGRAPHY

## 2016-08-19 MED ORDER — IOPAMIDOL (ISOVUE-370) INJECTION 76%
INTRAVENOUS | Status: DC | PRN
  Administered 2016-08-19: 40 mL via INTRA_ARTERIAL

## 2016-08-19 MED ORDER — ASPIRIN 81 MG PO CHEW: 81.0000 mg | CHEWABLE_TABLET | ORAL | Status: DC

## 2016-08-19 MED ORDER — ENOXAPARIN SODIUM 40 MG/0.4ML ~~LOC~~ SOLN
40.0000 mg | SUBCUTANEOUS | Status: DC
  Administered 2016-08-20 – 2016-08-21 (×2): 40 mg via SUBCUTANEOUS
  Filled 2016-08-19 (×2): qty 0.4

## 2016-08-19 MED ORDER — SODIUM CHLORIDE 0.9 % IV SOLN: 250.0000 mL | INTRAVENOUS | Status: DC | PRN

## 2016-08-19 MED ORDER — FENTANYL CITRATE (PF) 100 MCG/2ML IJ SOLN
INTRAMUSCULAR | Status: DC | PRN
  Administered 2016-08-19 (×2): 25 ug via INTRAVENOUS

## 2016-08-19 MED ORDER — HEPARIN (PORCINE) IN NACL 2-0.9 UNIT/ML-% IJ SOLN
INTRAMUSCULAR | Status: DC | PRN
  Administered 2016-08-19: 1000 mL

## 2016-08-19 MED ORDER — LIDOCAINE HCL (PF) 1 % IJ SOLN
INTRAMUSCULAR | Status: AC
  Filled 2016-08-19: qty 30

## 2016-08-19 MED ORDER — MIDAZOLAM HCL 2 MG/2ML IJ SOLN
INTRAMUSCULAR | Status: DC | PRN
  Administered 2016-08-19 (×2): 1 mg via INTRAVENOUS

## 2016-08-19 MED ORDER — SODIUM CHLORIDE 0.9 % IV SOLN: INTRAVENOUS | Status: AC

## 2016-08-19 MED ORDER — SODIUM CHLORIDE 0.9% FLUSH
3.0000 mL | INTRAVENOUS | Status: DC | PRN
Start: 2016-08-19 — End: 2016-08-19

## 2016-08-19 MED ORDER — FENTANYL CITRATE (PF) 100 MCG/2ML IJ SOLN
INTRAMUSCULAR | Status: AC
  Filled 2016-08-19: qty 2

## 2016-08-19 MED ORDER — FUROSEMIDE 10 MG/ML IJ SOLN: 80.0000 mg | Freq: Once | INTRAMUSCULAR | Status: DC

## 2016-08-19 MED ORDER — VERAPAMIL HCL 2.5 MG/ML IV SOLN
INTRAVENOUS | Status: AC
  Filled 2016-08-19: qty 2

## 2016-08-19 MED ORDER — HEPARIN SODIUM (PORCINE) 1000 UNIT/ML IJ SOLN
INTRAMUSCULAR | Status: DC | PRN
  Administered 2016-08-19: 3500 [IU] via INTRAVENOUS

## 2016-08-19 MED ORDER — ACETAMINOPHEN 325 MG PO TABS
650.0000 mg | ORAL_TABLET | ORAL | Status: DC | PRN
  Administered 2016-08-19: 650 mg via ORAL

## 2016-08-19 MED ORDER — SODIUM CHLORIDE 0.9% FLUSH
3.0000 mL | Freq: Two times a day (BID) | INTRAVENOUS | Status: DC
  Administered 2016-08-19 – 2016-08-21 (×4): 3 mL via INTRAVENOUS

## 2016-08-19 MED ORDER — HEPARIN (PORCINE) IN NACL 2-0.9 UNIT/ML-% IJ SOLN
INTRAMUSCULAR | Status: AC
  Filled 2016-08-19: qty 1000

## 2016-08-19 MED ORDER — MIDAZOLAM HCL 2 MG/2ML IJ SOLN
INTRAMUSCULAR | Status: AC
  Filled 2016-08-19: qty 2

## 2016-08-19 MED ORDER — LIDOCAINE HCL (PF) 1 % IJ SOLN
INTRAMUSCULAR | Status: DC | PRN
  Administered 2016-08-19: 4 mL

## 2016-08-19 MED ORDER — SODIUM CHLORIDE 0.9 % WEIGHT BASED INFUSION: 3.0000 mL/kg/h | INTRAVENOUS | Status: DC

## 2016-08-19 MED ORDER — ONDANSETRON HCL 4 MG/2ML IJ SOLN: 4.0000 mg | Freq: Four times a day (QID) | INTRAMUSCULAR | Status: DC | PRN

## 2016-08-19 MED ORDER — SODIUM CHLORIDE 0.9% FLUSH: 3.0000 mL | INTRAVENOUS | Status: DC | PRN

## 2016-08-19 MED ORDER — VERAPAMIL HCL 2.5 MG/ML IV SOLN
INTRAVENOUS | Status: DC | PRN
  Administered 2016-08-19: 10 mL via INTRA_ARTERIAL

## 2016-08-19 MED ORDER — SODIUM CHLORIDE 0.9 % WEIGHT BASED INFUSION: 1.0000 mL/kg/h | INTRAVENOUS | Status: DC

## 2016-08-19 MED ORDER — SODIUM CHLORIDE 0.9% FLUSH: 3.0000 mL | Freq: Two times a day (BID) | INTRAVENOUS | Status: DC

## 2016-08-19 MED ORDER — HEPARIN SODIUM (PORCINE) 1000 UNIT/ML IJ SOLN
INTRAMUSCULAR | Status: AC
  Filled 2016-08-19: qty 1

## 2016-08-19 MED ORDER — IOPAMIDOL (ISOVUE-370) INJECTION 76%
INTRAVENOUS | Status: AC
  Filled 2016-08-19: qty 100

## 2016-08-19 SURGICAL SUPPLY — 19 items
CATH BALLN WEDGE 5F 110CM (CATHETERS) ×3 IMPLANT
CATH INFINITI 4FR 145 PIGTAIL (CATHETERS) ×3 IMPLANT
CATH INFINITI 5 FR JL3.5 (CATHETERS) ×3 IMPLANT
CATH INFINITI 5FR ANG PIGTAIL (CATHETERS) ×3 IMPLANT
CATH INFINITI JR4 5F (CATHETERS) ×3 IMPLANT
CATH SWAN GANZ 7F STRAIGHT (CATHETERS) ×3 IMPLANT
DEVICE RAD COMP TR BAND LRG (VASCULAR PRODUCTS) ×3 IMPLANT
GLIDESHEATH SLEND SS 6F .021 (SHEATH) ×3 IMPLANT
GUIDEWIRE .025 260CM (WIRE) ×3 IMPLANT
KIT HEART LEFT (KITS) ×3 IMPLANT
KIT HEART RIGHT NAMIC (KITS) ×3 IMPLANT
PACK CARDIAC CATHETERIZATION (CUSTOM PROCEDURE TRAY) ×3 IMPLANT
SHEATH FAST CATH BRACH 5F 5CM (SHEATH) ×3 IMPLANT
SHEATH PINNACLE 7F 10CM (SHEATH) ×3 IMPLANT
SYR MEDRAD MARK V 150ML (SYRINGE) IMPLANT
TRANSDUCER W/STOPCOCK (MISCELLANEOUS) ×3 IMPLANT
TUBING CIL FLEX 10 FLL-RA (TUBING) IMPLANT
WIRE HI TORQ VERSACORE-J 145CM (WIRE) ×3 IMPLANT
WIRE SAFE-T 1.5MM-J .035X260CM (WIRE) ×3 IMPLANT

## 2016-08-19 NOTE — Interval H&P Note (Signed)
History and Physical Interval Note:  08/19/2016 12:19 PM  [name]  has presented today for surgery, with the diagnosis of NSTEMI  The various methods of treatment have been discussed with the patient and family. After consideration of risks, benefits and other options for treatment, the patient has consented to  Right and left heart cath with coronary angiography and possible angioplasty as a surgical intervention .  The patient's history has been reviewed, patient examined, no change in status, stable for surgery.  I have reviewed the patient's chart and labs.  Questions were answered to the patient's satisfaction.     Cath Lab Visit (complete for each Cath Lab visit)  Clinical Evaluation Leading to the Procedure:   ACS: Yes.    Non-ACS:    Anginal Classification: CCS IV  Anti-ischemic medical therapy: Minimal Therapy (1 class of medications)  Non-Invasive Test Results: No non-invasive testing performed  Prior CABG: No previous CABG        Jorge Retz, Quillian Quince

## 2016-08-19 NOTE — Progress Notes (Signed)
During the  Shift change, pt's right radial site has a slight bleeding noted, inflate 5cc air to the airband, overnight there was no bleeding, pt slept well, will continue to monitor.

## 2016-08-19 NOTE — H&P (View-Only) (Signed)
   Subjective: No CP  Breathing is OK at rest   Objective: Vitals:   08/18/16 1311 08/18/16 2013 08/19/16 0442 08/19/16 0800  BP: (!) 103/49 136/81 130/76 (!) 141/76  Pulse: 70 72 83 66  Resp: 18 18 18 20   Temp: 98.4 F (36.9 C) 98.7 F (37.1 C) 98.4 F (36.9 C) 98.3 F (36.8 C)  TempSrc: Oral Oral Oral Oral  SpO2: 100% 94% 99% 97%  Weight:   167 lb 11.2 oz (76.1 kg)   Height:       Weight change: -7 lb 4.8 oz (-3.311 kg)  Intake/Output Summary (Last 24 hours) at 08/19/16 0956 Last data filed at 08/19/16 0807  Gross per 24 hour  Intake             1040 ml  Output              600 ml  Net              440 ml    General: Alert, awake, oriented x3, in no acute distress Neck:  JVP is normal Heart: Regular rate and rhythm, without murmurs, rubs, gallops.  Lungs: Clear to auscultation.  No rales or wheezes. Exemities:  No edema.   Neuro: Grossly intact, nonfocal.  Tele  Ventericular paced    Lab Results: No results found for this or any previous visit (from the past 24 hour(s)).  Studies/Results: No results found.  Medications:  Reivewed   @PROBHOSP @  1  Dyspnea  Multiple admits for dyspnea with mild elev in trop  Myovue in Dec 2016 without ischemia   Note discussion by J Branch yesterday re R/L heart cat to define  WIth recurrent admits and mild elev in trop I agree with above plans  Pt also agrees  Anxious though Plan for today    2  Chronic atrial fib   With PPM   Not on anticoag due to GI bleed   2  HCM without obstruction  4  Chronic diastolic CHF  As above  Volume status does not look bad    5  LOS: 1 day   [name] 08/19/2016, 9:56 AM

## 2016-08-19 NOTE — Progress Notes (Signed)
   Subjective: No CP  Breathing is OK at rest   Objective: Vitals:   08/18/16 1311 08/18/16 2013 08/19/16 0442 08/19/16 0800  BP: (!) 103/49 136/81 130/76 (!) 141/76  Pulse: 70 72 83 66  Resp: 18 18 18 20   Temp: 98.4 F (36.9 C) 98.7 F (37.1 C) 98.4 F (36.9 C) 98.3 F (36.8 C)  TempSrc: Oral Oral Oral Oral  SpO2: 100% 94% 99% 97%  Weight:   167 lb 11.2 oz (76.1 kg)   Height:       Weight change: -7 lb 4.8 oz (-3.311 kg)  Intake/Output Summary (Last 24 hours) at 08/19/16 0956 Last data filed at 08/19/16 0807  Gross per 24 hour  Intake             1040 ml  Output              600 ml  Net              440 ml    General: Alert, awake, oriented x3, in no acute distress Neck:  JVP is normal Heart: Regular rate and rhythm, without murmurs, rubs, gallops.  Lungs: Clear to auscultation.  No rales or wheezes. Exemities:  No edema.   Neuro: Grossly intact, nonfocal.  Tele  Ventericular paced    Lab Results: No results found for this or any previous visit (from the past 24 hour(s)).  Studies/Results: No results found.  Medications:  Reivewed   @PROBHOSP @  1  Dyspnea  Multiple admits for dyspnea with mild elev in trop  Myovue in Dec 2016 without ischemia   Note discussion by J Branch yesterday re R/L heart cat to define  WIth recurrent admits and mild elev in trop I agree with above plans  Pt also agrees  Anxious though Plan for today    2  Chronic atrial fib   With PPM   Not on anticoag due to GI bleed   2  HCM without obstruction  4  Chronic diastolic CHF  As above  Volume status does not look bad    5  LOS: 1 day   Dorris Carnes 08/19/2016, 9:56 AM

## 2016-08-19 NOTE — Progress Notes (Signed)
Discussed results of cardiac cath with Dr. Harrington Challenger and will check BNP today - give 80 mg IV lasix now and recheck BNP in AM.   Dr. Harrington Challenger to see in AM.

## 2016-08-19 NOTE — Progress Notes (Addendum)
Right groin a 68F venous sheath was removed by Suella Broad RN. Site level 0 before sheath pull, level 0 after sheath pulled. Right DP +2, manual pressure held for 20 minutes. Clean dressing applied, care instructions given to patient. Vital signs remain stable.  Bedrest started at 1400pm

## 2016-08-19 NOTE — Progress Notes (Signed)
Family Medicine Teaching Service Daily Progress Note Intern Pager: [phone]  Patient name: [name] Medical record number: [id number] Date of birth: 04/08/1946 Age: 70 y.o. Gender: female  Primary Care Provider: Steve Rattler, DO Consultants: none Code Status: FULL  Pt Overview and Major Events to Date:  9/3: Admitted for dyspnea.   Assessment and Plan: Brandey [name] is a 70 y.o. female presenting with dyspnea. PMH is significant for recent ICU stay (7/27-8/7), difficult airway, afib without anticoag 2/2 GI bleeds, pacemaker, COPD, CHF (20-25%, non obstructive HOCM, pacer leads intact), HLD, GAD, HTN, CKDIII.   Dyspnea: Patient was intubated last month. Numerous notes stating the patient has a difficult airway. Currently dyspnea include some audible upper airway wheezes. Lung fields clear bilaterally. No crackles or rhonchi. Etiology likely upper airway irritation versus possible infection. Pt feels greatly improved after 1 day of prednisone, significantly decreased upper airway wheezing. No sputum production. - Continuous pulse ox. - Supplemental O2 as needed. - Prednisone 50 mg daily for now, consider longer taper - Consider racemic epinephrine if respiratory status worsens - Duonebs (every 6 hours when necessary)  Suspected CHF exacerbation: Trop 0.4, EKG unchanged from previous. CXR consistent with pulmonary edema (relatively underwhelming). She has no peripheral edema. BNP elevated to 466.6 (most recently 144; 02/05/16 >> 436; 11/25/2015). Etiology deemed likely CHF exacerbation due to radiology CXR read and elevated BNP. That said, weight is only up 1 pound since discharge on 07/10/16, and no crackles on exam. COPD exacerbation is unlikely due to benign lung exam. Known pulmonary hypertension with a pressure of 62 on previous echo. Cannot rule out PE at this time. -consulted cards > cath today -Low threshold for CCM consult given history of intubation and difficult  airway -trend trops >> stable (~0.45) -EKG PRN chest pain -lasix 40mg  IV once >> down 379ml from admission; still does not appear overloaded on exam.  -strict I&Os  -daily weights -PT eval > no PT followup, consider cardiac rehab  Afib: CHADsVASC score 4. Also history of AVB with pacemaker. She is being paced now. Not further anticoagulated secondary to history of ?GI bleeds.  -continue home ASA 81mg   COPD: Only on albuterol at home. Consider adding additional controller meds. No home O2.  -duonebs q6H PRN -Oxygen as needed -Low threshold for CCM consult given history of intubation and difficult airway  GAD: Home meds include xanax 0.5mg  QHS.  -continue home xanax  HTN: Hypertensive on admission. No home BP meds, norvasc was d/c'd on discharge 07/26/16. -PRN hydralazine SBP>160/DBP>110 -monitor BP   Anemia: Hemoglobin 8.7>9.5 (baseline 9-10), MCV 87.6 suggesting anemia of chronic disease. RDW of 17 >> likely Retic. No recent anemia panel. She has history of GI bleed. No recent colonoscopy on file also she had capsule endoscopy in 2013 for GI bleed -continue home iron -Monitor CBCs  Constipation: reports BM at home every 3 days with occasional suppository use.  -miralax daily PRN  FEN/GI: heart healthy carb mod  Prophylaxis: lovenox  Disposition: pending medical improvement  Subjective:  Pt feels greatly improved with prednisone. No longer having audible wheezing while speaking or on exam.  Objective: Temp:  [98.3 F (36.8 C)-98.7 F (37.1 C)] 98.3 F (36.8 C) (09/05 0800) Pulse Rate:  [66-83] 66 (09/05 0800) Resp:  [18-20] 20 (09/05 0800) BP: (103-141)/(49-81) 141/76 (09/05 0800) SpO2:  [94 %-100 %] 97 % (09/05 0800) Weight:  [167 lb 11.2 oz (76.1 kg)] 167 lb 11.2 oz (76.1 kg) (09/05 0442) Physical Exam: General: NAD, alert,  oriented, in bed. Cardiovascular: rrr, no m/r/g, no edema bilaterally Respiratory: CTAB, no wheezes or crackles. Neuro: CN II-XII  grossly intact  Laboratory:  Recent Labs Lab 08/17/16 0959 08/18/16 0232  WBC 5.3 4.6  HGB 8.8* 8.7*  HCT 29.6* 29.9*  PLT 227 234    Recent Labs Lab 08/17/16 0959 08/18/16 0232  NA 139 142  K 3.7 3.4*  CL 108 104  CO2 25 29  BUN 17 12  CREATININE 1.15* 1.14*  CALCIUM 9.5 9.5  GLUCOSE 111* 100*    Imaging/Diagnostic Tests: CXR 9/3/17IMPRESSION: Congestive heart failure.  Sela Hilding, MD 08/19/2016, 9:20 AM PGY-1, [facility] Intern pager: [phone], text pages welcome

## 2016-08-19 NOTE — Progress Notes (Signed)
PT Cancellation Note  Patient Details Name: Floetta Chiappa MRN: [id number] DOB: [birth year]   Cancelled Treatment:    Reason Eval/Treat Not Completed: Patient at procedure or test/unavailable   Will follow up for PT after Heart Cath;  Likely tomorrow;   Roney Marion, [place] Pager [phone] Office [phone]    Roney Marion [facility] 08/19/2016, 12:09 PM

## 2016-08-20 DIAGNOSIS — N183 Chronic kidney disease, stage 3 (moderate): Secondary | ICD-10-CM | POA: Diagnosis not present

## 2016-08-20 DIAGNOSIS — N179 Acute kidney failure, unspecified: Secondary | ICD-10-CM | POA: Diagnosis not present

## 2016-08-20 DIAGNOSIS — Z7952 Long term (current) use of systemic steroids: Secondary | ICD-10-CM | POA: Diagnosis not present

## 2016-08-20 DIAGNOSIS — G2581 Restless legs syndrome: Secondary | ICD-10-CM | POA: Diagnosis not present

## 2016-08-20 DIAGNOSIS — R0602 Shortness of breath: Secondary | ICD-10-CM | POA: Diagnosis not present

## 2016-08-20 DIAGNOSIS — I482 Chronic atrial fibrillation: Secondary | ICD-10-CM | POA: Diagnosis not present

## 2016-08-20 DIAGNOSIS — I130 Hypertensive heart and chronic kidney disease with heart failure and stage 1 through stage 4 chronic kidney disease, or unspecified chronic kidney disease: Secondary | ICD-10-CM | POA: Diagnosis not present

## 2016-08-20 DIAGNOSIS — G4730 Sleep apnea, unspecified: Secondary | ICD-10-CM | POA: Diagnosis not present

## 2016-08-20 DIAGNOSIS — L309 Dermatitis, unspecified: Secondary | ICD-10-CM | POA: Diagnosis not present

## 2016-08-20 DIAGNOSIS — Z950 Presence of cardiac pacemaker: Secondary | ICD-10-CM | POA: Diagnosis not present

## 2016-08-20 DIAGNOSIS — I248 Other forms of acute ischemic heart disease: Secondary | ICD-10-CM | POA: Diagnosis not present

## 2016-08-20 DIAGNOSIS — I4891 Unspecified atrial fibrillation: Secondary | ICD-10-CM | POA: Diagnosis not present

## 2016-08-20 DIAGNOSIS — I5033 Acute on chronic diastolic (congestive) heart failure: Secondary | ICD-10-CM | POA: Diagnosis not present

## 2016-08-20 DIAGNOSIS — J449 Chronic obstructive pulmonary disease, unspecified: Secondary | ICD-10-CM | POA: Diagnosis not present

## 2016-08-20 DIAGNOSIS — E785 Hyperlipidemia, unspecified: Secondary | ICD-10-CM | POA: Diagnosis not present

## 2016-08-20 DIAGNOSIS — I422 Other hypertrophic cardiomyopathy: Secondary | ICD-10-CM | POA: Diagnosis not present

## 2016-08-20 DIAGNOSIS — I272 Other secondary pulmonary hypertension: Secondary | ICD-10-CM | POA: Diagnosis not present

## 2016-08-20 DIAGNOSIS — I481 Persistent atrial fibrillation: Secondary | ICD-10-CM | POA: Diagnosis not present

## 2016-08-20 DIAGNOSIS — R0600 Dyspnea, unspecified: Secondary | ICD-10-CM | POA: Diagnosis not present

## 2016-08-20 DIAGNOSIS — M353 Polymyalgia rheumatica: Secondary | ICD-10-CM | POA: Diagnosis not present

## 2016-08-20 DIAGNOSIS — Z7982 Long term (current) use of aspirin: Secondary | ICD-10-CM | POA: Diagnosis not present

## 2016-08-20 LAB — BASIC METABOLIC PANEL
Anion gap: 13 (ref 5–15)
BUN: 23 mg/dL — AB (ref 6–20)
CHLORIDE: 100 mmol/L — AB (ref 101–111)
CO2: 25 mmol/L (ref 22–32)
Calcium: 9.1 mg/dL (ref 8.9–10.3)
Creatinine, Ser: 1.41 mg/dL — ABNORMAL HIGH (ref 0.44–1.00)
GFR calc non Af Amer: 36 mL/min — ABNORMAL LOW (ref >60)
GFR, EST AFRICAN AMERICAN: 42 mL/min — AB (ref >60)
Glucose, Bld: 117 mg/dL — ABNORMAL HIGH (ref 65–99)
POTASSIUM: 3.9 mmol/L (ref 3.5–5.1)
SODIUM: 138 mmol/L (ref 135–145)

## 2016-08-20 LAB — CBC
HEMATOCRIT: 29.6 % — AB (ref 36.0–46.0)
HEMOGLOBIN: 8.8 g/dL — AB (ref 12.0–15.0)
MCH: 25.8 pg — AB (ref 26.0–34.0)
MCHC: 29.7 g/dL — ABNORMAL LOW (ref 30.0–36.0)
MCV: 86.8 fL (ref 78.0–100.0)
Platelets: 285 10*3/uL (ref 150–400)
RBC: 3.41 MIL/uL — AB (ref 3.87–5.11)
RDW: 16.8 % — ABNORMAL HIGH (ref 11.5–15.5)
WBC: 4.9 10*3/uL (ref 4.0–10.5)

## 2016-08-20 LAB — IRON AND TIBC
IRON: 45 ug/dL (ref 28–170)
SATURATION RATIOS: 14 % (ref 10.4–31.8)
TIBC: 322 ug/dL (ref 250–450)
UIBC: 277 ug/dL

## 2016-08-20 LAB — FERRITIN: Ferritin: 138 ng/mL (ref 11–307)

## 2016-08-20 LAB — BRAIN NATRIURETIC PEPTIDE: B NATRIURETIC PEPTIDE 5: 311.6 pg/mL — AB (ref 0.0–100.0)

## 2016-08-20 MED ORDER — FUROSEMIDE 10 MG/ML IJ SOLN
80.0000 mg | Freq: Once | INTRAMUSCULAR | Status: AC
  Administered 2016-08-20: 80 mg via INTRAVENOUS
  Filled 2016-08-20: qty 8

## 2016-08-20 NOTE — Discharge Summary (Signed)
[facility] Discharge Summary  Patient name: [name] Medical record number: [id number] Date of birth: 1945-12-23 Age: 70 y.o. Gender: female Date of Admission: 08/17/2016  Date of Discharge: 08/21/16 Admitting Physician: Lupita Dawn, MD  Primary Care Provider: Steve Rattler, DO Consultants: cardiology  Indication for Hospitalization: dyspnea  Discharge Diagnoses/Problem List:  Dyspnea - likely subglottic irritation from multiple intubations recently CHF (severe DD) Afib COPD GAD HTN Anemia  Disposition: home with [facility] nursing and PT  Discharge Condition: stable  Discharge Exam:  General: NAD, alert, oriented, in bed. Cardiovascular: rrr, no m/r/g, no edema bilaterally Respiratory: CTAB, no wheezes or crackles. Neuro: CN II-XII grossly intact  Brief Hospital Course:  Pt admitted with wheezing and dyspnea without hypoxia. CXR on admission consistent with pulmonary edema and stable cardiomegaly. No peripheral edema at any point. Trop 0.4 at admission. BNP elevated to 466. Lasix 40 mg given once. Wheezing significantly improved with prednisone 50mg , will taper from this. Cards consulted, reviewed trops > this is likely chronic, but cath done > normal coronary arteries, mild to mod pulmonary HTN, prominent v-waves suggestive of severe DD, normal LV function. As a result of severe DD, cards titrating lasix diuresis. Pt was counseled on dietary salt and water restriction. Discharged on 40mg  lasix QD with close cards followup.  Issues for Follow Up:  1. Continue prednisone taper: 50x5 days, 40x3 days, 30x3 days, 20 x3days, 10x3 days then d/c.  2. Consider further anemia workup: iron, TIBC, and ferritin normal. Hgb baseline appears to be 9-10. Pt denies melena or clots in bowel movements. Pt DOES report several episodes "clots" with urination, but denies this during this hospitalization and doesn't have any vaginal bleeding per her  report. 3. Recheck BMP 2/2 AKI resolving.  Significant Procedures: cath: normal coronary arteries, mild to mod pHTN. Prominent v-waves suggestive of several DD or signficiant MR (although mild MR on last echo). Normal LV function and cardiac output.  Significant Labs and Imaging:   Recent Labs Lab 08/19/16 0958 08/20/16 0253 08/21/16 0236  WBC 4.9 4.9 5.6  HGB 9.5* 8.8* 9.2*  HCT 32.0* 29.6* 31.5*  PLT 289 285 299    Recent Labs Lab 08/17/16 0959 08/18/16 0232 08/19/16 0958 08/20/16 0253 08/21/16 0236  NA 139 142 137 138 139  K 3.7 3.4* 4.5 3.9 3.8  CL 108 104 105 100* 102  CO2 25 29 25 25 29   GLUCOSE 111* 100* 149* 117* 127*  BUN 17 12 19  23* 27*  CREATININE 1.15* 1.14* 1.16* 1.41* 1.32*  CALCIUM 9.5 9.5 10.0 9.1 9.4     Results/Tests Pending at Time of Discharge:  Discharge Medications:    Medication List    STOP taking these medications   furosemide 20 MG tablet Commonly known as:  LASIX   predniSONE 1 MG tablet Commonly known as:  DELTASONE Replaced by:  predniSONE 10 MG (21) Tbpk tablet     TAKE these medications   acetaminophen 325 MG tablet Commonly known as:  TYLENOL Take 325 mg by mouth daily as needed (pain).   albuterol (2.5 MG/3ML) 0.083% nebulizer solution Commonly known as:  PROVENTIL Take 3 mLs (2.5 mg total) by nebulization every 6 (six) hours as needed for wheezing or shortness of breath. What changed:  Another medication with the same name was removed. Continue taking this medication, and follow the directions you see here.   ALPRAZolam 0.5 MG tablet Commonly known as:  XANAX Take 1 tablet (0.5 mg total) by  mouth at bedtime as needed for anxiety. What changed:  how much to take   aspirin EC 81 MG tablet Take 81 mg by mouth daily.   CALCIUM PO Take 1 tablet by mouth once a week.   DULCOLAX 10 MG suppository Generic drug:  bisacodyl Place 10 mg rectally. Take every other day as needed.   ferrous sulfate 325 (65 FE) MG  tablet TAKE 1 TABLET BY MOUTH TWICE DAILY WITH MEALS TO KEEP BLOOD COUNT UP What changed:  See the new instructions.   nitroGLYCERIN 0.4 MG SL tablet Commonly known as:  NITROSTAT Place 1 tablet (0.4 mg total) under the tongue every 5 (five) minutes as needed for chest pain.   omeprazole 20 MG capsule Commonly known as:  PRILOSEC Take 20 mg by mouth daily.   polyethylene glycol packet Commonly known as:  MIRALAX / GLYCOLAX Take 17 g by mouth daily. What changed:  when to take this  reasons to take this   predniSONE 10 MG (21) Tbpk tablet Commonly known as:  STERAPRED UNI-PAK 21 TAB Take by mouth daily. Take 50mg  tomorrow 9/8, Take 40mg  9/9-9/11, Take 30mg  9/12-9/14, Take 20mg  9/15-9/17, Take 10mg   9/18-9/20 Replaces:  predniSONE 1 MG tablet   rosuvastatin 20 MG tablet Commonly known as:  CRESTOR TAKE 1 TABLET BY MOUTH DAILY ON MONDAY, WEDNESDAY, AND FRIDAY   senna 8.6 MG tablet Commonly known as:  SENOKOT Take 1 tablet by mouth at bedtime as needed for constipation.   trolamine salicylate 10 % cream Commonly known as:  ASPERCREME Apply 1 application topically as needed for muscle pain.   QC ARTHRITIS EX Apply 1 application topically as needed (for pain).       Discharge Instructions: Please refer to Patient Instructions section of EMR for full details.  Patient was counseled important signs and symptoms that should prompt return to medical care, changes in medications, dietary instructions, activity restrictions, and follow up appointments.   Follow-Up Appointments: Follow-up Information    Steve Rattler, DO. Go on 08/26/2016.   Why:  3:30pm for hospital follow up, arrive by 3:15pm Contact information: [place] [address] [phone]        Thompson Grayer, MD. Go on 08/25/2016.   Specialty:  Cardiology Why:  8:30am for the cardiologist Contact information: [place] [address] [place] .   Why:  Home Health Physical Therapy and aide Contact information: [place] [address] [phone]           Sela Hilding, MD 08/22/2016, 9:40 AM PGY-1, [place]

## 2016-08-20 NOTE — Progress Notes (Signed)
Physical Therapy Treatment Patient Details Name: [name] MRN: [id number] DOB: 10/20/1946 Today's Date: 08/20/2016    History of Present Illness Brandii Rieser is a 70 y.o. female presenting with dyspnea. PMH is significant for recent ICU stay (7/27-8/7), difficult airway, afib without anticoag 2/2 GI bleeds, pacemaker, COPD, CHF (20-25%, non obstructive HOCM, pacer leads intact), HLD, GAD, HTN, CKDIII.    PT Comments    Pt admitted with above diagnosis. Pt currently with functional limitations due to the deficits listed below (see PT Problem List). Pt was able to ambulate in hallway.  Slight instability at times but pt does self correct.  Cannot accept moderate challenges to balance. Will benefit from a few sessions of HHPT.  Will continue acute PT. Pt will benefit from skilled PT to increase their independence and safety with mobility to allow discharge to the venue listed below.    Follow Up Recommendations  Home health PT (short term HHPT would benefit pt)     Equipment Recommendations  None recommended by PT    Recommendations for Other Services       Precautions / Restrictions Precautions Precautions: Fall Precaution Comments: DOE Restrictions Weight Bearing Restrictions: No    Mobility  Bed Mobility Overal bed mobility: Modified Independent                Transfers Overall transfer level: Modified independent                  Ambulation/Gait Ambulation/Gait assistance: Supervision;Min guard Ambulation Distance (Feet): 250 Feet Assistive device: None Gait Pattern/deviations: Step-through pattern;Decreased stride length   Gait velocity interpretation: Below normal speed for age/gender General Gait Details: Pt was occasionally off balance but does self correct without instruction of asssit. Pt ambulating well overall.  May need to use RW initially at home for safety.    Stairs            Wheelchair Mobility    Modified  Rankin (Stroke Patients Only)       Balance Overall balance assessment: Needs assistance Sitting-balance support: No upper extremity supported;Feet supported Sitting balance-Leahy Scale: Good     Standing balance support: No upper extremity supported;During functional activity Standing balance-Leahy Scale: Poor Standing balance comment: Did need occasional steadying assist for balance. could not accept challenges to balance.                    Cognition Arousal/Alertness: Awake/alert Behavior During Therapy: WFL for tasks assessed/performed Overall Cognitive Status: Within Functional Limits for tasks assessed                      Exercises General Exercises - Lower Extremity Ankle Circles/Pumps: AROM;Both;10 reps;Seated Long Arc Quad: AROM;Both;10 reps;Seated    General Comments        Pertinent Vitals/Pain Pain Assessment: No/denies pain  VSS    Home Living                      Prior Function            PT Goals (current goals can now be found in the care plan section) Progress towards PT goals: Progressing toward goals    Frequency  Min 3X/week    PT Plan Discharge plan needs to be updated    Co-evaluation             End of Session Equipment Utilized During Treatment: Gait belt Activity Tolerance: Patient tolerated treatment well Patient left: in  chair;with call bell/phone within reach     Time: 1035-1045 PT Time Calculation (min) (ACUTE ONLY): 10 min  Charges:  $Gait Training: 8-22 mins                    G Codes:      Denice Paradise 2016-08-31, 11:17 AM [facility] Acute Rehabilitation [phone] (pager)

## 2016-08-20 NOTE — Progress Notes (Signed)
Family Medicine Teaching Service Daily Progress Note Intern Pager: [phone]  Patient name: Brittony Koelker Medical record number: [id number] Date of birth: 1946-02-19 Age: 70 y.o. Gender: female  Primary Care Provider: Steve Rattler, DO Consultants: cardiology Code Status: FULL  Pt Overview and Major Events to Date:  9/3: Admitted for dyspnea.  9/4: Cards consulted in the setting of elevated troponin, consider cath. 9/5: Pt underwent cath > normal coronary arteries, mild to mod pulmonary HTN, prominent v-waves suggestive of severe DD, normal LV function  Assessment and Plan: Marlesa Chavanne is a 70 y.o. female presenting with dyspnea. PMH is significant for recent ICU stay (7/27-8/7), difficult airway, afib without anticoag 2/2 GI bleeds, pacemaker, COPD, CHF (20-25%, non obstructive HOCM, pacer leads intact), HLD, GAD, HTN, CKDIII.   Dyspnea: Patient was intubated last month. Numerous notes stating the patient has a difficult airway. Currently dyspnea include some audible upper airway wheezes. Lung fields clear bilaterally. No crackles or rhonchi. Etiology likely upper airway irritation versus possible infection. Pt feels greatly improved after 1 day of prednisone, significantly decreased upper airway wheezing. No sputum production. - Continuous pulse ox. - Supplemental O2 as needed. - Prednisone 50 mg daily x5 days; taper to 40mg  x3days, 30mg x 3 days, 20mg  x3 days, then 10mg x3 and discontinue - Consider racemic epinephrine if respiratory status worsens - Duonebs (every 6 hours when necessary)  Suspected CHF exacerbation: Trop 0.4, EKG unchanged from previous. CXR consistent with pulmonary edema. She has no peripheral edema. BNP elevated to 466.6 (most recently 144; 02/05/16 >> 436; 11/25/2015). Etiology deemed likely CHF exacerbation due to radiology CXR read and elevated BNP. That said, weight is only up 1 pound since discharge on 07/10/16, and no crackles on exam. COPD  exacerbation is unlikely due to benign lung exam. Known pulmonary hypertension with a pressure of 62 on previous echo. Cannot rule out PE at this time. -consulted cards > cath yesterday c/w pHTN and severe DD, lasix 80mg  IV 9/5, recheck BMP -Low threshold for CCM consult given history of intubation and difficult airway -trend trops >> stable (~0.45) -EKG PRN chest pain -lasix 80mg  IV on 9/5, will hold diuresis today -strict I&Os  -daily weights -PT eval > no PT followup, consider cardiac rehab  Afib: CHADsVASC score 4. Also history of AVB with pacemaker. She is being paced now. Not further anticoagulated secondary to history of ?GI bleeds.  -continue home ASA 81mg   COPD: Only on albuterol at home. Consider adding additional controller meds. No home O2.  -duonebs q6H PRN -Oxygen as needed -Low threshold for CCM consult given history of intubation and difficult airway  GAD: Home meds include xanax 0.5mg  QHS.  -continue home xanax  HTN: Hypertensive on admission. No home BP meds, norvasc was d/c'd on discharge 07/26/16. -PRN hydralazine SBP>160/DBP>110 -monitor BP   Anemia: Hemoglobin 8.7>9.5>8.8 (baseline 9-10), microcytic. She has history of GI bleed. No recent colonoscopy on file also she had capsule endoscopy in 2013 for GI bleed.  -Iron normal, TIBC normal, ferritin normal.  -consider further GI or GU workup -continue home iron -Monitor CBCs  Constipation: reports BM at home every 3 days with occasional suppository use.  -miralax daily PRN  AKI: Baseline around 1.1-1.3. Cr 1.41 today in the setting of IV lasix 80mg  yesterday and contrast during the cath.  -continue to monitor  FEN/GI: heart healthy carb mod Prophylaxis: lovenox  Disposition: pending medical improvement  Subjective:  Pt feels well this morning, would be amenable to going home. Still a little  curious about what cardiology found yesterday - reassured her that her coronary arteries look clear. Of note,  she reports that she recently saw clots while urinating. This has not happened this hospitalization.   Objective: Temp:  [97.1 F (36.2 C)-98.3 F (36.8 C)] 97.1 F (36.2 C) (09/06 0549) Pulse Rate:  [0-102] 75 (09/06 0549) Resp:  [0-57] 20 (09/06 0549) BP: (120-177)/(67-108) 136/80 (09/06 0549) SpO2:  [0 %-100 %] 100 % (09/06 0549) Weight:  [166 lb 4.8 oz (75.4 kg)] 166 lb 4.8 oz (75.4 kg) (09/06 0549) Physical Exam: General: NAD, alert, oriented, in bed. Cardiovascular: rrr, no m/r/g, no edema bilaterally Respiratory: CTAB, no wheezes or crackles. Neuro: CN II-XII grossly intact  Laboratory:  Recent Labs Lab 08/18/16 0232 08/19/16 0958 08/20/16 0253  WBC 4.6 4.9 4.9  HGB 8.7* 9.5* 8.8*  HCT 29.9* 32.0* 29.6*  PLT 234 289 285    Recent Labs Lab 08/18/16 0232 08/19/16 0958 08/20/16 0253  NA 142 137 138  K 3.4* 4.5 3.9  CL 104 105 100*  CO2 29 25 25   BUN 12 19 23*  CREATININE 1.14* 1.16* 1.41*  CALCIUM 9.5 10.0 9.1  GLUCOSE 100* 149* 117*    Imaging/Diagnostic Tests: CXR 9/3/17IMPRESSION: Congestive heart failure.  Sela Hilding, MD 08/20/2016, 7:06 AM PGY-1, [place] Intern pager: [phone], text pages welcome

## 2016-08-20 NOTE — Progress Notes (Signed)
Physical Therapy Note  (Late entry for G Code correction)    2016-08-29 1601  PT G-Codes **NOT FOR INPATIENT CLASS**  Functional Assessment Tool Used Clinical Judgement  Functional Limitation Mobility: Walking and moving around  Mobility: Walking and Moving Around Current Status [id number]) CI  Mobility: Walking and Moving Around Goal Status [phone]) [place], PT  Acute Rehabilitation Services Pager [phone] Office [phone]

## 2016-08-20 NOTE — Progress Notes (Signed)
Pt did not get IV lasix yesterday will give now and Dr. Harrington Challenger to see.  Hold discharge for now.

## 2016-08-20 NOTE — Progress Notes (Signed)
Subjective: No CP  No SOB at rest   Objective: Vitals:   08/19/16 2026 08/20/16 0549 08/20/16 0748 08/20/16 0815  BP: 120/67 136/80 134/87 132/78  Pulse: 74 75 75 71  Resp: 20 20 18    Temp: 97.3 F (36.3 C) 97.1 F (36.2 C)  97.6 F (36.4 C)  TempSrc: Oral Oral  Oral  SpO2: 99% 100% 99% 98%  Weight:  166 lb 4.8 oz (75.4 kg)    Height:       Weight change: -1 lb 6.4 oz (-0.635 kg)  Intake/Output Summary (Last 24 hours) at 08/20/16 0902 Last data filed at 08/20/16 0801  Gross per 24 hour  Intake                0 ml  Output             1800 ml  Net            -1800 ml    General: Alert, awake, oriented x3, in no acute distress Neck:  JVP is normal Heart: Regular rate and rhythm, without murmurs, rubs, gallops.  Lungs: Mild rales Exemities:  Tr  edema.   Neuro: Grossly intact, nonfocal.  Tele   Paced  Lab Results: Results for orders placed or performed during the hospital encounter of 08/17/16 (from the past 24 hour(s))  CBC     Status: Abnormal   Collection Time: 08/19/16  9:58 AM  Result Value Ref Range   WBC 4.9 4.0 - 10.5 K/uL   RBC 3.67 (L) 3.87 - 5.11 MIL/uL   Hemoglobin 9.5 (L) 12.0 - 15.0 g/dL   HCT 32.0 (L) 36.0 - 46.0 %   MCV 87.2 78.0 - 100.0 fL   MCH 25.9 (L) 26.0 - 34.0 pg   MCHC 29.7 (L) 30.0 - 36.0 g/dL   RDW 16.5 (H) 11.5 - 15.5 %   Platelets 289 150 - 400 K/uL  Basic metabolic panel     Status: Abnormal   Collection Time: 08/19/16  9:58 AM  Result Value Ref Range   Sodium 137 135 - 145 mmol/L   Potassium 4.5 3.5 - 5.1 mmol/L   Chloride 105 101 - 111 mmol/L   CO2 25 22 - 32 mmol/L   Glucose, Bld 149 (H) 65 - 99 mg/dL   BUN 19 6 - 20 mg/dL   Creatinine, Ser 1.16 (H) 0.44 - 1.00 mg/dL   Calcium 10.0 8.9 - 10.3 mg/dL   GFR calc non Af Amer 46 (L) >60 mL/min   GFR calc Af Amer 53 (L) >60 mL/min   Anion gap 7 5 - 15  Brain natriuretic peptide     Status: Abnormal   Collection Time: 08/19/16  9:58 AM  Result Value Ref Range   B Natriuretic  Peptide 459.6 (H) 0.0 - 100.0 pg/mL  I-STAT 3, arterial blood gas (G3+)     Status: Abnormal   Collection Time: 08/19/16 12:32 PM  Result Value Ref Range   pH, Arterial 7.448 7.350 - 7.450   pCO2 arterial 37.3 32.0 - 48.0 mmHg   pO2, Arterial 68.0 (L) 83.0 - 108.0 mmHg   Bicarbonate 25.8 20.0 - 28.0 mmol/L   TCO2 27 0 - 100 mmol/L   O2 Saturation 94.0 %   Acid-Base Excess 2.0 0.0 - 2.0 mmol/L   Patient temperature HIDE    Sample type ARTERIAL   I-STAT 3, venous blood gas (G3P V)     Status: Abnormal   Collection Time: 08/19/16  1:02 PM  Result Value Ref Range   pH, Ven 7.415 7.250 - 7.430   pCO2, Ven 40.3 (L) 44.0 - 60.0 mmHg   pO2, Ven 30.0 (LL) 32.0 - 45.0 mmHg   Bicarbonate 25.9 20.0 - 28.0 mmol/L   TCO2 27 0 - 100 mmol/L   O2 Saturation 59.0 %   Acid-Base Excess 1.0 0.0 - 2.0 mmol/L   Patient temperature HIDE    Sample type VENOUS    Comment NOTIFIED PHYSICIAN   I-STAT 3, venous blood gas (G3P V)     Status: Abnormal   Collection Time: 08/19/16  1:02 PM  Result Value Ref Range   pH, Ven 7.431 (H) 7.250 - 7.430   pCO2, Ven 41.2 (L) 44.0 - 60.0 mmHg   pO2, Ven 30.0 (LL) 32.0 - 45.0 mmHg   Bicarbonate 27.4 20.0 - 28.0 mmol/L   TCO2 29 0 - 100 mmol/L   O2 Saturation 59.0 %   Acid-Base Excess 3.0 (H) 0.0 - 2.0 mmol/L   Patient temperature HIDE    Sample type VENOUS    Comment NOTIFIED PHYSICIAN   CBC     Status: Abnormal   Collection Time: 08/20/16  2:53 AM  Result Value Ref Range   WBC 4.9 4.0 - 10.5 K/uL   RBC 3.41 (L) 3.87 - 5.11 MIL/uL   Hemoglobin 8.8 (L) 12.0 - 15.0 g/dL   HCT 29.6 (L) 36.0 - 46.0 %   MCV 86.8 78.0 - 100.0 fL   MCH 25.8 (L) 26.0 - 34.0 pg   MCHC 29.7 (L) 30.0 - 36.0 g/dL   RDW 16.8 (H) 11.5 - 15.5 %   Platelets 285 150 - 400 K/uL  Basic metabolic panel     Status: Abnormal   Collection Time: 08/20/16  2:53 AM  Result Value Ref Range   Sodium 138 135 - 145 mmol/L   Potassium 3.9 3.5 - 5.1 mmol/L   Chloride 100 (L) 101 - 111 mmol/L   CO2 25  22 - 32 mmol/L   Glucose, Bld 117 (H) 65 - 99 mg/dL   BUN 23 (H) 6 - 20 mg/dL   Creatinine, Ser 1.41 (H) 0.44 - 1.00 mg/dL   Calcium 9.1 8.9 - 10.3 mg/dL   GFR calc non Af Amer 36 (L) >60 mL/min   GFR calc Af Amer 42 (L) >60 mL/min   Anion gap 13 5 - 15  Brain natriuretic peptide     Status: Abnormal   Collection Time: 08/20/16  2:53 AM  Result Value Ref Range   B Natriuretic Peptide 311.6 (H) 0.0 - 100.0 pg/mL    Studies/Results: No results found.  Medications: REviewed    @PROBHOSP @  1  Acute on chonic diastolic CHF   I have reviewed echo from July  LV is severely thickened  LV caviity is small LVEF is low normal (for degree of LVH) There is MR.  It does not appear severe, probably moderate.  May increase with activity . For now I would diurese with lasix  Carefully   Discussed fluid and salt restriction with pt/daughter    2  Chronic atrial fib PPM  Not a candidate for anticoag  3  Hypertrophic cardiomyopathy  As above    4  Mitral regurg   As above      LOS: 1 day   Dorris Carnes 08/20/2016, 9:02 AM

## 2016-08-21 ENCOUNTER — Other Ambulatory Visit: Payer: Self-pay | Admitting: Family Medicine

## 2016-08-21 DIAGNOSIS — I10 Essential (primary) hypertension: Secondary | ICD-10-CM | POA: Diagnosis not present

## 2016-08-21 DIAGNOSIS — N183 Chronic kidney disease, stage 3 (moderate): Secondary | ICD-10-CM | POA: Diagnosis not present

## 2016-08-21 DIAGNOSIS — L309 Dermatitis, unspecified: Secondary | ICD-10-CM | POA: Diagnosis not present

## 2016-08-21 DIAGNOSIS — J449 Chronic obstructive pulmonary disease, unspecified: Secondary | ICD-10-CM | POA: Diagnosis not present

## 2016-08-21 DIAGNOSIS — M353 Polymyalgia rheumatica: Secondary | ICD-10-CM | POA: Diagnosis not present

## 2016-08-21 DIAGNOSIS — I5033 Acute on chronic diastolic (congestive) heart failure: Secondary | ICD-10-CM | POA: Diagnosis not present

## 2016-08-21 DIAGNOSIS — I130 Hypertensive heart and chronic kidney disease with heart failure and stage 1 through stage 4 chronic kidney disease, or unspecified chronic kidney disease: Secondary | ICD-10-CM | POA: Diagnosis not present

## 2016-08-21 DIAGNOSIS — G4730 Sleep apnea, unspecified: Secondary | ICD-10-CM | POA: Diagnosis not present

## 2016-08-21 DIAGNOSIS — I272 Other secondary pulmonary hypertension: Secondary | ICD-10-CM | POA: Diagnosis not present

## 2016-08-21 DIAGNOSIS — R0600 Dyspnea, unspecified: Secondary | ICD-10-CM | POA: Diagnosis not present

## 2016-08-21 DIAGNOSIS — I4891 Unspecified atrial fibrillation: Secondary | ICD-10-CM | POA: Diagnosis not present

## 2016-08-21 DIAGNOSIS — I422 Other hypertrophic cardiomyopathy: Secondary | ICD-10-CM | POA: Diagnosis not present

## 2016-08-21 DIAGNOSIS — I248 Other forms of acute ischemic heart disease: Secondary | ICD-10-CM | POA: Diagnosis not present

## 2016-08-21 DIAGNOSIS — D649 Anemia, unspecified: Secondary | ICD-10-CM | POA: Diagnosis not present

## 2016-08-21 DIAGNOSIS — Z7982 Long term (current) use of aspirin: Secondary | ICD-10-CM | POA: Diagnosis not present

## 2016-08-21 DIAGNOSIS — Z7952 Long term (current) use of systemic steroids: Secondary | ICD-10-CM | POA: Diagnosis not present

## 2016-08-21 DIAGNOSIS — Z950 Presence of cardiac pacemaker: Secondary | ICD-10-CM | POA: Diagnosis not present

## 2016-08-21 DIAGNOSIS — I481 Persistent atrial fibrillation: Secondary | ICD-10-CM | POA: Diagnosis not present

## 2016-08-21 DIAGNOSIS — N179 Acute kidney failure, unspecified: Secondary | ICD-10-CM | POA: Diagnosis not present

## 2016-08-21 DIAGNOSIS — R269 Unspecified abnormalities of gait and mobility: Secondary | ICD-10-CM | POA: Diagnosis not present

## 2016-08-21 DIAGNOSIS — R0602 Shortness of breath: Secondary | ICD-10-CM | POA: Diagnosis not present

## 2016-08-21 DIAGNOSIS — G2581 Restless legs syndrome: Secondary | ICD-10-CM | POA: Diagnosis not present

## 2016-08-21 DIAGNOSIS — I482 Chronic atrial fibrillation: Secondary | ICD-10-CM | POA: Diagnosis not present

## 2016-08-21 DIAGNOSIS — I509 Heart failure, unspecified: Secondary | ICD-10-CM | POA: Diagnosis not present

## 2016-08-21 DIAGNOSIS — E785 Hyperlipidemia, unspecified: Secondary | ICD-10-CM | POA: Diagnosis not present

## 2016-08-21 LAB — BASIC METABOLIC PANEL WITH GFR
Anion gap: 8 (ref 5–15)
BUN: 27 mg/dL — ABNORMAL HIGH (ref 6–20)
CO2: 29 mmol/L (ref 22–32)
Calcium: 9.4 mg/dL (ref 8.9–10.3)
Chloride: 102 mmol/L (ref 101–111)
Creatinine, Ser: 1.32 mg/dL — ABNORMAL HIGH (ref 0.44–1.00)
GFR calc Af Amer: 45 mL/min — ABNORMAL LOW
GFR calc non Af Amer: 39 mL/min — ABNORMAL LOW
Glucose, Bld: 127 mg/dL — ABNORMAL HIGH (ref 65–99)
Potassium: 3.8 mmol/L (ref 3.5–5.1)
Sodium: 139 mmol/L (ref 135–145)

## 2016-08-21 LAB — CBC
HCT: 31.5 % — ABNORMAL LOW (ref 36.0–46.0)
Hemoglobin: 9.2 g/dL — ABNORMAL LOW (ref 12.0–15.0)
MCH: 25.7 pg — ABNORMAL LOW (ref 26.0–34.0)
MCHC: 29.2 g/dL — ABNORMAL LOW (ref 30.0–36.0)
MCV: 88 fL (ref 78.0–100.0)
Platelets: 299 K/uL (ref 150–400)
RBC: 3.58 MIL/uL — ABNORMAL LOW (ref 3.87–5.11)
RDW: 16.6 % — ABNORMAL HIGH (ref 11.5–15.5)
WBC: 5.6 K/uL (ref 4.0–10.5)

## 2016-08-21 MED ORDER — FUROSEMIDE 40 MG PO TABS: 40.0000 mg | ORAL_TABLET | Freq: Every day | ORAL | 0 refills | Status: DC

## 2016-08-21 MED ORDER — PREDNISONE 10 MG (21) PO TBPK: ORAL_TABLET | Freq: Every day | ORAL | 0 refills | Status: DC

## 2016-08-21 MED ORDER — FUROSEMIDE 10 MG/ML IJ SOLN
80.0000 mg | Freq: Once | INTRAMUSCULAR | Status: AC
  Administered 2016-08-21: 80 mg via INTRAVENOUS
  Filled 2016-08-21: qty 8

## 2016-08-21 NOTE — Progress Notes (Signed)
Nutrition Education Note  RD consulted for nutrition education regarding low sodium diet.   RD provided "Low Sodium Nutrition Therapy" handout from the Academy of [employer] and [employer]etics. Reviewed patient's dietary recall. Provided examples on ways to decrease sodium intake in diet. Discouraged intake of processed foods and use of salt shaker. Encouraged fresh fruits and vegetables as well as whole grain sources of carbohydrates to maximize fiber intake.   RD discussed why it is important for patient to adhere to diet recommendations, and emphasized the role of fluids, foods to avoid, and importance of weighing self daily. Encouraged pt to limit fluid intake to 60 ounces per day.  Teach back method used. Pt identified salt and pork rinds as food that she needs to stop eating. She also plans to look for more low sodium foods in the grocery store. She states that her children will help her with the shopping.   Expect good compliance.  Body mass index is 27.57 kg/m. Pt meets criteria for Overweight based on current BMI.  Current diet order is Heart Healthy/Carb Modified, patient is consuming approximately 100% of meals at this time. Labs and medications reviewed. No further nutrition interventions warranted at this time. RD contact information provided. If additional nutrition issues arise, please re-consult RD.   Scarlette Ar RD, LDN, CSP Inpatient Clinical Dietitian Pager: [phone] After Hours Pager: [phone]

## 2016-08-21 NOTE — Consult Note (Signed)
   [facility] CM Inpatient Consult   08/21/2016  [name] 11/29/1946 [id number]  Referral received for post hospital monitoring and medication management assistance.  Patient evaluated for community based chronic disease management services with [facility] as a benefit of patient's [facility].  Per chart review of H&P, the patient is a 70 y/o female with PMH Afib not on anticoagulation due to history of GI bleeds, pacemaker, COPD, Systolic HF, HLD, HTN, and CKD 3 recently discharged on 8/12 following Hypotension/Acute Respiratory Distress requiring intubation during TTE and placement of Watchman device. She was discharged to SNF and recently returned home on 8/30. She report SOB/DOE since time of discharge from hospital.    Spoke with patient and her sister was at the bedside, verified patient approved of information, at bedside to explain [facility].  Consent form signed and folder with [facility] information given with contact information. Patient verbalized she was mixed up on her medications when returning home.   Patient will receive post hospital discharge call and will be evaluated for monthly home visits for assessments and disease process education. PT currently recommeding HHPT.  Made Inpatient Case Manager aware that [facility] following. Of note, [facility] Care Management services does not replace or interfere with any services that are arranged by inpatient case management or social work.  For additional questions or referrals please contact:    Natividad Brood, RN BSN [facility] Liaison  [phone] business mobile phone Toll free office [phone]

## 2016-08-21 NOTE — Progress Notes (Signed)
Orders received for pt discharge.  Discharge summary printed and reviewed with pt.  Explained medication regimen, and pt had no further questions at this time.  IV removed and site remains clean, dry, intact.  Telemetry removed.  Pt in stable condition and awaiting transport. 

## 2016-08-21 NOTE — Discharge Instructions (Signed)
Please try to eat a low sodium, heart healthy diet as the nutritionist recommended. Call the cardiologist if you have increased lower extremity swelling or trouble breathing. If it severe, please come to the emergency room.  Follow up with Dr. [name] on 08/26/16 at 3:30pm.   Follow up with cardiology with Dr. Rayann Heman at 8:30am on 08/25/16.

## 2016-08-21 NOTE — Progress Notes (Signed)
Family Medicine Teaching Service Daily Progress Note Intern Pager: [phone]  Patient name: [name] Medical record number: [id number] Date of birth: 05-12-1946 Age: 70 y.o. Gender: female  Primary Care Provider: Steve Rattler, DO Consultants: cardiology Code Status: FULL  Pt Overview and Major Events to Date:  9/3: Admitted for dyspnea.  9/4: Cards consulted in the setting of elevated troponin, consider cath. 9/5: Pt underwent cath > normal coronary arteries, mild to mod pulmonary HTN, prominent v-waves suggestive of severe DD, normal LV function  Assessment and Plan: [name] is a 70 y.o. female presenting with dyspnea. PMH is significant for recent ICU stay (7/27-8/7), difficult airway, afib without anticoag 2/2 GI bleeds, pacemaker, COPD, CHF (20-25%, non obstructive HOCM, pacer leads intact), HLD, GAD, HTN, CKDIII.   Dyspnea: Patient was intubated last month. Numerous notes stating the patient has a difficult airway. Lung fields clear bilaterally. No crackles or rhonchi. Etiology likely upper airway irritation versus possible infection. Pt feels greatly improved on prednisone, significantly decreased upper airway wheezing. No sputum production. - Prednisone 50 mg daily x5 days; taper to 40mg  x3days, 30mg x 3 days, 20mg  x3 days, then 10mg x3 and discontinue - Duonebs (every 6 hours when necessary)  Suspected CHF exacerbation: Trop 0.4, EKG unchanged from previous. CXR consistent with pulmonary edema. She has no peripheral edema. BNP elevated to 466.6 (most recently 144; 02/05/16 >> 436; 11/25/2015). Etiology deemed likely CHF exacerbation due to radiology CXR read and elevated BNP. Known pulmonary hypertension with a pressure of 62 on previous echo.  -consulted cards > cath yesterday c/w pHTN and severe DD, lasix 80mg  IV 9/6, recheck BMP -EKG PRN chest pain -lasix 80mg  IV on 9/6 -strict I&Os  -daily weights -PT eval > no PT followup, consider cardiac  rehab  Afib: CHADsVASC score 4. Also history of AVB with pacemaker. She is being paced now. Not further anticoagulated secondary to history of ?GI bleeds.  -continue home ASA 81mg   COPD: Only on albuterol at home. Consider adding additional controller meds. No home O2.  -duonebs q6H PRN -Oxygen as needed -Low threshold for CCM consult given history of intubation and difficult airway  GAD: Home meds include xanax 0.5mg  QHS.  -continue home xanax  HTN: Hypertensive on admission. No home BP meds, norvasc was d/c'd on discharge 07/26/16. -PRN hydralazine SBP>160/DBP>110 -monitor BP   Anemia: Hemoglobin 8.7>9.5>8.8>9.2 (baseline 9-10), microcytic. She has history of GI bleed. No recent colonoscopy on file also she had capsule endoscopy in 2013 for GI bleed.  -Iron normal, TIBC normal, ferritin normal.  -consider further GI or GU workup -continue home iron -Monitor CBCs  Constipation: reports BM at home every 3 days with occasional suppository use.  -miralax daily PRN  AKI: Baseline around 1.1-1.3. Cr 1.32 today in the setting of IV lasix 80mg  yesterday and contrast during the cath.  -continue to monitor  FEN/GI: heart healthy carb mod Prophylaxis: lovenox  Disposition: pending medical improvement  Subjective:  Pt resting comfortably this morning, would like to go home, but understands that this depends on cards plans. She endorses frequent urination yesterday after the lasix.   Objective: Temp:  [97.6 F (36.4 C)-98.2 F (36.8 C)] 97.8 F (36.6 C) (09/06 2233) Pulse Rate:  [68-75] 68 (09/06 2233) Resp:  [18-20] 20 (09/06 2233) BP: (119-135)/(77-87) 119/77 (09/06 2233) SpO2:  [94 %-100 %] 94 % (09/06 2233) Physical Exam: General: NAD, alert, oriented, in bed. Cardiovascular: rrr, no m/r/g, no edema bilaterally Respiratory: CTAB, no wheezes or crackles. Neuro: CN II-XII  grossly intact  Laboratory:  Recent Labs Lab 08/19/16 0958 08/20/16 0253 08/21/16 0236   WBC 4.9 4.9 5.6  HGB 9.5* 8.8* 9.2*  HCT 32.0* 29.6* 31.5*  PLT 289 285 299    Recent Labs Lab 08/19/16 0958 08/20/16 0253 08/21/16 0236  NA 137 138 139  K 4.5 3.9 3.8  CL 105 100* 102  CO2 25 25 29   BUN 19 23* 27*  CREATININE 1.16* 1.41* 1.32*  CALCIUM 10.0 9.1 9.4  GLUCOSE 149* 117* 127*    Imaging/Diagnostic Tests: CXR 9/3/17IMPRESSION: Congestive heart failure.  Sela Hilding, MD 08/21/2016, 6:51 AM PGY-1, [place] Intern pager: [phone], text pages welcome

## 2016-08-21 NOTE — Care Management Note (Addendum)
Case Management Note  Patient Details  Name: [name] MRN: [id number] Date of Birth: 16-Apr-1946  Subjective/Objective:    CHF, Afib, COPD,                Action/Plan: Discharge Planning: AVS reviewed:   NCM spoke to pt and offered choice for [facility]. Pt states she was active with [facility] PT/RN and aide. Lives at home with dtr, [name]. Pt states she has RW at home. Requesting 3n1 for home. [name] Spoke to attending and pt has Rx neb solution at home but does not have neb machine. Contacted [facility] for 3n1 and neb machine for home. Contacted [facility] with resumption of care.   PCP- Steve Rattler MD  Expected Discharge Date:  08/21/2016               Expected Discharge Plan:  [place]  In-House Referral:  NA  Discharge planning Services  CM Consult  Post Acute Care Choice:  Home Health Choice offered to:  Patient  DME Arranged:  3-N-1 DME Agency:  [place]:  PT, Nurse's Aide, RN [place] Agency:  [facility] (now [facility])  Status of Service:  Completed, signed off  If discussed at [place] of Stay Meetings, dates discussed:    Additional Comments:  Erenest Rasher, RN 08/21/2016, 5:02 PM

## 2016-08-21 NOTE — Care Management (Signed)
SATURATION QUALIFICATIONS: (This note is used to comply with regulatory documentation for home oxygen)  Patient Saturations on Room Air at Rest = 99%  Patient Saturations on Room Air while Ambulating = 97%   Please briefly explain why patient needs home oxygen: pt did not have any dyspnea while ambulating.

## 2016-08-21 NOTE — Progress Notes (Signed)
Subjective: R shoulder pain  Mild SOB with talking   Objective: Vitals:   08/20/16 1117 08/20/16 2233 08/21/16 0710 08/21/16 0900  BP: 135/80 119/77 (!) 137/95 121/67  Pulse: 70 68 70 74  Resp: 20 20 20 20   Temp: 98.2 F (36.8 C) 97.8 F (36.6 C) 98.1 F (36.7 C) 98.5 F (36.9 C)  TempSrc: Oral Oral Oral Oral  SpO2: 100% 94% 97% 100%  Weight:   165 lb 11.2 oz (75.2 kg)   Height:       Weight change:   Intake/Output Summary (Last 24 hours) at 08/21/16 1127 Last data filed at 08/21/16 1016  Gross per 24 hour  Intake              465 ml  Output             1000 ml  Net             -535 ml    General: Alert, awake, oriented x3, in no acute distress Neck:  JVP is increased   Heart: Regular rate and rhythm, without murmurs, rubs, gallops.  Lungs: Mild wheezing   Exemities:  No edema.   Neuro: Grossly intact, nonfocal.  Tele  PAced    Lab Results: Results for orders placed or performed during the hospital encounter of 08/17/16 (from the past 24 hour(s))  CBC     Status: Abnormal   Collection Time: 08/21/16  2:36 AM  Result Value Ref Range   WBC 5.6 4.0 - 10.5 K/uL   RBC 3.58 (L) 3.87 - 5.11 MIL/uL   Hemoglobin 9.2 (L) 12.0 - 15.0 g/dL   HCT 31.5 (L) 36.0 - 46.0 %   MCV 88.0 78.0 - 100.0 fL   MCH 25.7 (L) 26.0 - 34.0 pg   MCHC 29.2 (L) 30.0 - 36.0 g/dL   RDW 16.6 (H) 11.5 - 15.5 %   Platelets 299 150 - 400 K/uL  Basic metabolic panel     Status: Abnormal   Collection Time: 08/21/16  2:36 AM  Result Value Ref Range   Sodium 139 135 - 145 mmol/L   Potassium 3.8 3.5 - 5.1 mmol/L   Chloride 102 101 - 111 mmol/L   CO2 29 22 - 32 mmol/L   Glucose, Bld 127 (H) 65 - 99 mg/dL   BUN 27 (H) 6 - 20 mg/dL   Creatinine, Ser 1.32 (H) 0.44 - 1.00 mg/dL   Calcium 9.4 8.9 - 10.3 mg/dL   GFR calc non Af Amer 39 (L) >60 mL/min   GFR calc Af Amer 45 (L) >60 mL/min   Anion gap 8 5 - 15    Studies/Results: No results found.  Medications: REviewed  @PROBHOSP @  1  Acute  on chronic diastolic CHF Multiple admits  Recent cath  PCWP 22 (v wave to 37) Pt with SOB with talking  Some mild wheezing on exam I would recomm lasix today  80 IV this AM   Sh admits to taking 20 lasix daily and one time per week taking 40 She needs to have dietary come by and see her  She would benefit from [facility] if available   Confused easily about meds     2  CP  Prob due to 1  Normal coronary arteries  Trop reflects demand ischemi in setting of severe LVH and resultant severe diastolic dysfunction  +mod MR    3  Chronic afib  S/p PPM  No anticoag    LOS: 1  day   Dorris Carnes 08/21/2016, 11:27 AM

## 2016-08-22 ENCOUNTER — Other Ambulatory Visit: Payer: Self-pay

## 2016-08-22 DIAGNOSIS — J441 Chronic obstructive pulmonary disease with (acute) exacerbation: Secondary | ICD-10-CM

## 2016-08-25 ENCOUNTER — Ambulatory Visit (INDEPENDENT_AMBULATORY_CARE_PROVIDER_SITE_OTHER): Payer: Medicare Other | Admitting: Internal Medicine

## 2016-08-25 ENCOUNTER — Encounter (INDEPENDENT_AMBULATORY_CARE_PROVIDER_SITE_OTHER): Payer: Self-pay

## 2016-08-25 ENCOUNTER — Encounter: Payer: Self-pay | Admitting: Internal Medicine

## 2016-08-25 VITALS — BP 138/88 | HR 74 | Ht 65.0 in | Wt 170.4 lb

## 2016-08-25 DIAGNOSIS — I5030 Unspecified diastolic (congestive) heart failure: Secondary | ICD-10-CM | POA: Diagnosis not present

## 2016-08-25 DIAGNOSIS — I482 Chronic atrial fibrillation, unspecified: Secondary | ICD-10-CM

## 2016-08-25 DIAGNOSIS — I422 Other hypertrophic cardiomyopathy: Secondary | ICD-10-CM

## 2016-08-25 DIAGNOSIS — Z7982 Long term (current) use of aspirin: Secondary | ICD-10-CM | POA: Diagnosis not present

## 2016-08-25 DIAGNOSIS — Z7952 Long term (current) use of systemic steroids: Secondary | ICD-10-CM | POA: Diagnosis not present

## 2016-08-25 DIAGNOSIS — Z950 Presence of cardiac pacemaker: Secondary | ICD-10-CM

## 2016-08-25 DIAGNOSIS — J449 Chronic obstructive pulmonary disease, unspecified: Secondary | ICD-10-CM | POA: Diagnosis not present

## 2016-08-25 DIAGNOSIS — Z7289 Other problems related to lifestyle: Secondary | ICD-10-CM | POA: Diagnosis not present

## 2016-08-25 DIAGNOSIS — Q2730 Arteriovenous malformation, site unspecified: Secondary | ICD-10-CM

## 2016-08-25 DIAGNOSIS — M353 Polymyalgia rheumatica: Secondary | ICD-10-CM | POA: Diagnosis not present

## 2016-08-25 DIAGNOSIS — I110 Hypertensive heart disease with heart failure: Secondary | ICD-10-CM

## 2016-08-25 DIAGNOSIS — R2689 Other abnormalities of gait and mobility: Secondary | ICD-10-CM | POA: Diagnosis not present

## 2016-08-25 DIAGNOSIS — R531 Weakness: Secondary | ICD-10-CM | POA: Diagnosis not present

## 2016-08-25 DIAGNOSIS — I5033 Acute on chronic diastolic (congestive) heart failure: Secondary | ICD-10-CM

## 2016-08-25 DIAGNOSIS — N183 Chronic kidney disease, stage 3 (moderate): Secondary | ICD-10-CM | POA: Diagnosis not present

## 2016-08-25 DIAGNOSIS — J385 Laryngeal spasm: Secondary | ICD-10-CM

## 2016-08-25 DIAGNOSIS — I442 Atrioventricular block, complete: Secondary | ICD-10-CM

## 2016-08-25 DIAGNOSIS — I130 Hypertensive heart and chronic kidney disease with heart failure and stage 1 through stage 4 chronic kidney disease, or unspecified chronic kidney disease: Secondary | ICD-10-CM | POA: Diagnosis not present

## 2016-08-25 DIAGNOSIS — R0602 Shortness of breath: Secondary | ICD-10-CM | POA: Diagnosis not present

## 2016-08-25 LAB — CUP PACEART INCLINIC DEVICE CHECK
Date Time Interrogation Session: 20170911122916
Implantable Lead Implant Date: 19860725
Implantable Lead Location: 753859
Implantable Lead Serial Number: 652511
Lead Channel Impedance Value: 444 Ohm
Lead Channel Pacing Threshold Amplitude: 1 V
Lead Channel Setting Pacing Pulse Width: 0.5 ms
Lead Channel Setting Sensing Sensitivity: 8 mV
MDC IDC LEAD IMPLANT DT: 19860725
MDC IDC LEAD LOCATION: 753860
MDC IDC MSMT BATTERY IMPEDANCE: 1000 Ohm — AB
MDC IDC MSMT BATTERY VOLTAGE: 2.78 V
MDC IDC MSMT LEADCHNL RV PACING THRESHOLD PULSEWIDTH: 0.5 ms
MDC IDC SET LEADCHNL RV PACING AMPLITUDE: 2.5 V
Pulse Gen Serial Number: 2406848

## 2016-08-25 LAB — BASIC METABOLIC PANEL
BUN: 43 mg/dL — AB (ref 7–25)
CHLORIDE: 101 mmol/L (ref 98–110)
CO2: 29 mmol/L (ref 20–31)
CREATININE: 1.34 mg/dL — AB (ref 0.60–0.93)
Calcium: 10.2 mg/dL (ref 8.6–10.4)
GLUCOSE: 97 mg/dL (ref 65–99)
Potassium: 4.5 mmol/L (ref 3.5–5.3)
Sodium: 140 mmol/L (ref 135–146)

## 2016-08-25 LAB — CBC WITH DIFFERENTIAL/PLATELET
BASOS ABS: 0 {cells}/uL (ref 0–200)
Basophils Relative: 0 %
EOS ABS: 63 {cells}/uL (ref 15–500)
Eosinophils Relative: 1 %
HCT: 31.6 % — ABNORMAL LOW (ref 35.0–45.0)
Hemoglobin: 9.9 g/dL — ABNORMAL LOW (ref 11.7–15.5)
LYMPHS PCT: 30 %
Lymphs Abs: 1890 cells/uL (ref 850–3900)
MCH: 26.5 pg — AB (ref 27.0–33.0)
MCHC: 31.3 g/dL — ABNORMAL LOW (ref 32.0–36.0)
MCV: 84.5 fL (ref 80.0–100.0)
MONOS PCT: 12 %
MPV: 9.4 fL (ref 7.5–12.5)
Monocytes Absolute: 756 cells/uL (ref 200–950)
Neutro Abs: 3591 cells/uL (ref 1500–7800)
Neutrophils Relative %: 57 %
PLATELETS: 379 10*3/uL (ref 140–400)
RBC: 3.74 MIL/uL — ABNORMAL LOW (ref 3.80–5.10)
RDW: 16.8 % — AB (ref 11.0–15.0)
WBC: 6.3 10*3/uL (ref 3.8–10.8)

## 2016-08-25 NOTE — Patient Instructions (Addendum)
Medication Instructions:  Your physician has recommended you make the following change in your medication:  1) Increase Furosemide to 40 mg twice daily for 3 days then go back to daily    Labwork: Your physician recommends that you return for lab work today: BNP/BMP/CBC    Testing/Procedures: None ordered   Follow-Up: Your physician recommends that you schedule a follow-up appointment in: 2 weeks with Chanetta Marshall, NP and 4 weeks with Dr Pat Kocher have been referred to Dr Wolicki---[phone]--he saw patient in the hospital      Low-Sodium Eating Plan Sodium raises blood pressure and causes water to be held in the body. Getting less sodium from food will help lower your blood pressure, reduce any swelling, and protect your heart, liver, and kidneys. We get sodium by adding salt (sodium chloride) to food. Most of our sodium comes from canned, boxed, and frozen foods. Restaurant foods, fast foods, and pizza are also very high in sodium. Even if you take medicine to lower your blood pressure or to reduce fluid in your body, getting less sodium from your food is important. WHAT IS MY PLAN? Most people should limit their sodium intake to 2,300 mg a day. Your health care provider recommends that you limit your sodium intake to 2 grams a day.  WHAT DO I NEED TO KNOW ABOUT THIS EATING PLAN? For the low-sodium eating plan, you will follow these general guidelines:  Choose foods with a % Daily Value for sodium of less than 5% (as listed on the food label).   Use salt-free seasonings or herbs instead of table salt or sea salt.   Check with your health care provider or pharmacist before using salt substitutes.   Eat fresh foods.  Eat more vegetables and fruits.  Limit canned vegetables. If you do use them, rinse them well to decrease the sodium.   Limit cheese to 1 oz (28 g) per day.   Eat lower-sodium products, often labeled as "lower sodium" or "no salt added."  Avoid  foods that contain monosodium glutamate (MSG). MSG is sometimes added to [place] food and some canned foods.  Check food labels (Nutrition Facts labels) on foods to learn how much sodium is in one serving.  Eat more home-cooked food and less restaurant, buffet, and fast food.  When eating at a restaurant, ask that your food be prepared with less salt, or no salt if possible.  HOW DO I READ FOOD LABELS FOR SODIUM INFORMATION? The Nutrition Facts label lists the amount of sodium in one serving of the food. If you eat more than one serving, you must multiply the listed amount of sodium by the number of servings. Food labels may also identify foods as:  Sodium free--Less than 5 mg in a serving.  Very low sodium--35 mg or less in a serving.  Low sodium--140 mg or less in a serving.  Light in sodium--50% less sodium in a serving. For example, if a food that usually has 300 mg of sodium is changed to become light in sodium, it will have 150 mg of sodium.  Reduced sodium--25% less sodium in a serving. For example, if a food that usually has 400 mg of sodium is changed to reduced sodium, it will have 300 mg of sodium. WHAT FOODS CAN I EAT? Grains Low-sodium cereals, including oats, puffed wheat and rice, and shredded wheat cereals. Low-sodium crackers. Unsalted rice and pasta. Lower-sodium bread.  Vegetables Frozen or fresh vegetables. Low-sodium or reduced-sodium canned vegetables. Low-sodium  or reduced-sodium tomato sauce and paste. Low-sodium or reduced-sodium tomato and vegetable juices.  Fruits Fresh, frozen, and canned fruit. Fruit juice.  Meat and Other Protein Products Low-sodium canned tuna and salmon. Fresh or frozen meat, poultry, seafood, and fish. Lamb. Unsalted nuts. Dried beans, peas, and lentils without added salt. Unsalted canned beans. Homemade soups without salt. Eggs.  Dairy Milk. Soy milk. Ricotta cheese. Low-sodium or reduced-sodium cheeses. Yogurt.   Condiments Fresh and dried herbs and spices. Salt-free seasonings. Onion and garlic powders. Low-sodium varieties of mustard and ketchup. Fresh or refrigerated horseradish. Lemon juice.  Fats and Oils Reduced-sodium salad dressings. Unsalted butter.  Other Unsalted popcorn and pretzels.  The items listed above may not be a complete list of recommended foods or beverages. Contact your dietitian for more options. WHAT FOODS ARE NOT RECOMMENDED? Grains Instant hot cereals. Bread stuffing, pancake, and biscuit mixes. Croutons. Seasoned rice or pasta mixes. Noodle soup cups. Boxed or frozen macaroni and cheese. Self-rising flour. Regular salted crackers. Vegetables Regular canned vegetables. Regular canned tomato sauce and paste. Regular tomato and vegetable juices. Frozen vegetables in sauces. Salted [place] fries. Olives. Angie Fava. Relishes. Sauerkraut. Salsa. Meat and Other Protein Products Salted, canned, smoked, spiced, or pickled meats, seafood, or fish. Bacon, ham, sausage, hot dogs, corned beef, chipped beef, and packaged luncheon meats. Salt pork. Jerky. Pickled herring. Anchovies, regular canned tuna, and sardines. Salted nuts. Dairy Processed cheese and cheese spreads. Cheese curds. Blue cheese and cottage cheese. Buttermilk.  Condiments Onion and garlic salt, seasoned salt, table salt, and sea salt. Canned and packaged gravies. Worcestershire sauce. Tartar sauce. Barbecue sauce. Teriyaki sauce. Soy sauce, including reduced sodium. Steak sauce. Fish sauce. Oyster sauce. Cocktail sauce. Horseradish that you find on the shelf. Regular ketchup and mustard. Meat flavorings and tenderizers. Bouillon cubes. Hot sauce. Tabasco sauce. Marinades. Taco seasonings. Relishes. Fats and Oils Regular salad dressings. Salted butter. Margarine. Ghee. Bacon fat.  Other Potato and tortilla chips. Corn chips and puffs. Salted popcorn and pretzels. Canned or dried soups. Pizza. Frozen entrees and  pot pies.  The items listed above may not be a complete list of foods and beverages to avoid. Contact your dietitian for more information.   This information is not intended to replace advice given to you by your health care provider. Make sure you discuss any questions you have with your health care provider.   Document Released: 05/23/2002 Document Revised: 12/22/2014 Document Reviewed: 10/05/2013 Elsevier Interactive Patient Education [employer].

## 2016-08-25 NOTE — Progress Notes (Signed)
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   [name] recently had screening TEE for watchman with Dr Johnsie [name].  [name] has respiratory failure intraprocedure and the procedure was cancelled.  [name] has had a very low recovery from this.  Fortunately, [name] has improved substantially.  [name] still has some SOB.  Today, [name] denies symptoms of CP, lower extremity edema, dizziness, presyncope, or syncope.  The patient is otherwise without complaint today.   Past Medical History:  Diagnosis Date  . Anemia   . Angina   . Angioedema    2/2 ACE  . Arteriovenous malformation of stomach   . Arthritis   . Asthma   . AVM (arteriovenous malformation) of colon    small intestine; stomach  . Blood transfusion   . CHF (congestive heart failure) ([name])   . Complete heart block ([name])    s/p PPM 1998  . DDD (degenerative disc disease)   . Depression   . Diastolic heart failure   . Fatty liver 07/26/10  . GERD (gastroesophageal reflux disease)   . GI bleed   . History of alcohol abuse Stopped Fall 2012  . History of tobacco use Quit Fall 2012  . Hx of cardiovascular stress test    a. Lexiscan Myoview (10/15):  Small inferolateral and apical defect c/w scar and poss soft tissue attenuation, no ischemia, EF 42%  . Hx of colonic polyp 08/13/10   adenomatous  . Hx of colonoscopy   . Hyperlipidemia   . Hyperlipidemia   . Hypertension   . Hypertrophic cardiomyopathy ([name])    dx by Dr [name] 2009  . Iron deficiency anemia   . Panic attack   . Panic attacks   . Permanent atrial fibrillation ([name])   . Renal failure    baseline creatinine 1.6  . Right arm pain 01/08/2012  . RLS (restless legs syndrome)    Dx 06/2007  . Shortness of breath    sob on exertation  . Sleep apnea    Past Surgical History:  Procedure Laterality Date  . CARDIAC CATHETERIZATION    . CARDIAC CATHETERIZATION N/A 08/19/2016   Procedure: Right/Left Heart Cath and Coronary Angiography;  Surgeon: [name] Artist, MD;  Location: [facility];  Service: Cardiovascular;  Laterality: N/A;  . ESOPHAGOGASTRODUODENOSCOPY  12/23/2011   Procedure: ESOPHAGOGASTRODUODENOSCOPY (EGD);  Surgeon: [name] Dragon, MD;  Location: [facility];  Service: Endoscopy;  Laterality: N/A;  . EXTUBATION (ENDOTRACHEAL) IN OR N/A 07/21/2016   Procedure: EXTUBATION (ENDOTRACHEAL) IN OR;  Surgeon: [name], MD;  Location: [facility];  Service: ENT;  Laterality: N/A;  . GIVENS CAPSULE STUDY  12/23/2011   Procedure: GIVENS CAPSULE STUDY;  Surgeon: [name] Dragon, MD;  Location: [facility];  Service: Endoscopy;  Laterality: N/A;  . PACEMAKER INSERTION  1998   st jude, most recent gen change by [name] 4/12  . TUBAL LIGATION  04/01/2000    Current Outpatient Prescriptions  Medication Sig Dispense Refill  . acetaminophen (TYLENOL) 325 MG tablet Take 325 mg by mouth daily as needed (pain).    [name] albuterol (PROVENTIL) (2.5 MG/3ML) 0.083% nebulizer solution Take 3 mLs (2.5 mg total) by nebulization every 6 (six) hours as needed for wheezing or shortness of breath. 150 mL 1  . ALPRAZolam (XANAX) 0.5 MG tablet Take 1 tablet (0.5 mg total) by mouth at bedtime as needed for anxiety. 15 tablet  0  . aspirin EC 81 MG tablet Take 81 mg by mouth daily.     . bisacodyl (DULCOLAX) 10 MG suppository Place 10 mg rectally. Take every other day as needed.    [name] CALCIUM PO Take 1 tablet by mouth as directed. As needed once a week    . ferrous sulfate 325 (65 FE) MG tablet TAKE 1 TABLET BY MOUTH TWICE DAILY WITH MEALS TO KEEP BLOOD COUNT UP 60 tablet 11  . furosemide (LASIX) 40 MG tablet TAKE 1 TABLET BY MOUTH DAILY 90 tablet 0  . nitroGLYCERIN (NITROSTAT) 0.4 MG SL tablet Place 1 tablet (0.4 mg total) under the tongue every 5 (five) minutes as needed for chest pain. 25 tablet 1  . omeprazole (PRILOSEC) 20 MG capsule Take 20 mg by mouth daily.    . polyethylene glycol  (MIRALAX / GLYCOLAX) packet Take 17 g by mouth daily. 14 each 0  . predniSONE (STERAPRED UNI-PAK 21 TAB) 10 MG (21) TBPK tablet Take by mouth daily. Take 50mg  tomorrow 9/8, Take 40mg  9/9-9/11, Take 30mg  9/12-9/14, Take 20mg  9/15-9/17, Take 10mg   9/18-9/20 35 tablet 0  . rosuvastatin (CRESTOR) 20 MG tablet TAKE 1 TABLET BY MOUTH DAILY ON MONDAY, WEDNESDAY, AND FRIDAY 38 tablet 3  . senna (SENOKOT) 8.6 MG tablet Take 1 tablet by mouth at bedtime as needed for constipation.     . trolamine salicylate (ASPERCREME) 10 % cream Apply 1 application topically 2 (two) times daily as needed for muscle pain.     [name] Salicylate (QC ARTHRITIS EX) Apply 1 application topically daily as needed (for pain).      No current facility-administered medications for this visit.    ROS- all systems are reviewed and negative except as per HPI above  Physical Exam: Vitals:   08/25/16 0838  BP: 138/88  Pulse: 74  Weight: 170 lb 6.4 oz (77.3 kg)  Height: 5\' 5"  (1.651 m)    GEN- The patient is well appearing, alert and oriented x 3 today.   Head- normocephalic, atraumatic Eyes-  Sclera clear, conjunctiva pink Ears- hearing intact Oropharynx- clear Neck- no stridor Lungs- Clear to ausculation bilaterally, normal work of breathing Chest- pacemaker pocket is well healed Heart- Regular rate and rhythm, (paced) GI- soft, NT, ND, + BS Extremities- no clubbing, cyanosis, or edema  Pacemaker interrogation- reviewed in detail today,  See PACEART report  Assessment and Plan:  1. Complete heart block Normal pacemaker function See Pace Art report 100% V paced.    2. Permanent afib Not a candidate for long term anticoagulation due to prior GI bleeding or for watchman given recent difficulty with screening TEE.  I would favor a conservative approach going forward.  3. HTN Stable No change required today  4. Atypical chest pain (chronic) Recent cath revealed normal cors  5. Acute on chronic systolic  dysfunction/ hocm- Increase lasix to 40mg  BID x 3 days then 40mg  daily Bmet, bnp today Follow-up with Chanetta Marshall NP in 2 weeks Follow-up with Dr Oval Linsey in 4 weeks  5. Chronic blood loss anemia Cbc today Not a candidate for long term anticoagulation  6. Recent respiratory issues [name] and her daughter with to follow-up with ENT.  I think that this is reasonable and have encouraged them to see Dr Erik Obey at the next available time.  Follow-up with Chanetta Marshall NP in 2 weeks Follow-up with Dr Oval Linsey in 4 weeks I will continue to follow annually for PPM management  Thompson Grayer MD, [facility]  08/25/2016 9:17 AM

## 2016-08-26 ENCOUNTER — Encounter: Payer: Self-pay | Admitting: Family Medicine

## 2016-08-26 ENCOUNTER — Ambulatory Visit (INDEPENDENT_AMBULATORY_CARE_PROVIDER_SITE_OTHER): Payer: Medicare Other | Admitting: Family Medicine

## 2016-08-26 ENCOUNTER — Other Ambulatory Visit: Payer: Self-pay

## 2016-08-26 VITALS — BP 142/68 | HR 70 | Temp 97.8°F | Wt 170.0 lb

## 2016-08-26 DIAGNOSIS — R0609 Other forms of dyspnea: Secondary | ICD-10-CM

## 2016-08-26 LAB — BRAIN NATRIURETIC PEPTIDE: Brain Natriuretic Peptide: 210.5 pg/mL — ABNORMAL HIGH (ref <100)

## 2016-08-26 MED ORDER — ROSUVASTATIN CALCIUM 20 MG PO TABS: 20.0000 mg | ORAL_TABLET | Freq: Every day | ORAL | 3 refills | Status: DC

## 2016-08-26 MED ORDER — FERROUS SULFATE 325 (65 FE) MG PO TABS: ORAL_TABLET | ORAL | 11 refills | Status: DC

## 2016-08-26 MED ORDER — FUROSEMIDE 40 MG PO TABS: 40.0000 mg | ORAL_TABLET | Freq: Every day | ORAL | 3 refills | Status: DC

## 2016-08-26 MED ORDER — OMEPRAZOLE 20 MG PO CPDR: 20.0000 mg | DELAYED_RELEASE_CAPSULE | Freq: Every day | ORAL | 6 refills | Status: DC

## 2016-08-26 NOTE — Progress Notes (Signed)
    Subjective:    Patient ID: [name], female    DOB: 1946-04-11, 70 y.o.   MRN: [id number]   [name] is here for hospital follow up. She has been recently admitted twice to [facility] with complications secondary to heart failure. She has no current complaints.  She is doing well today. She is tapering prednisone as instructed without issues. She saw electrophysiology yesterday and they recommended medical management for Afib. BMP yesterday was done and he increased lasix to 80 a day for the next 3 days then back to 40. She is managing her medications well. She checks her weight daily. She has many appointments scheduled in upcoming weeks with specialists and is requesting transportation form to be filled out to help her get to appointments. Daughter is with her and is very involved with her care.   Denies chest pain, shortness of breath. Does endorse dyspnea on exertion and finds it hard to walk very far. Uses walker. Denies weakness. Does have fatigue but has felt this way since first hospitalization when she was intubated and in ICU for some time.   Smoking status reviewed- former smoker    Objective:  BP (!) 142/68   Pulse 70   Temp 97.8 F (36.6 C) (Oral)   Wt 170 lb (77.1 kg)   SpO2 94%   BMI 28.29 kg/m  Vitals and nursing note reviewed  General: elderly lady, well appearing, well nourished, in no acute distress Cardiac: RRR, clear S1 and S2, no murmurs, rubs, or gallops Respiratory: clear to auscultation bilaterally, no increased work of breathing Extremities: no edema or cyanosis. Warm, well perfused. Skin: warm and dry, no rashes noted Neuro: alert and oriented, no focal deficits   Assessment & Plan:    Dyspnea on exertion  Improved since discharge, patient doing well overall. Following up with several specialists and medical problems seem well controlled at this point  -refilled medications for lasix, prilosec, crestor, iron -continue  tapering prednisone as instructed -SCAT form completed -continue daily weights -continue home medications -teaching for albuterol nebulizer performed -return in 3 months or sooner if needed -follow up with cardiology, ENT as scheduled  Return in about 3 months (around 11/25/2016).   Lucila Maine, DO Family Medicine Resident PGY-1

## 2016-08-26 NOTE — Patient Outreach (Signed)
   Unsuccessful attempt made to contact patient via telephone for Transition of Care. Attempted to contact patient at # [phone] and # [phone].  Plan: Make another attempt to contact patient on tomorrow, September 13.

## 2016-08-26 NOTE — Assessment & Plan Note (Addendum)
  Improved since discharge, patient doing well overall. Following up with several specialists and medical problems seem well controlled at this point  -refilled medications for lasix, prilosec, crestor, iron -continue tapering prednisone as instructed -SCAT form completed -continue daily weights -continue home medications -teaching for albuterol nebulizer performed -return in 3 months or sooner if needed -follow up with cardiology, ENT as scheduled

## 2016-08-26 NOTE — Patient Instructions (Signed)
  It was nice meeting you today!  Please keep taking your medications as you have been  Come back in 3 months or sooner if you need to.

## 2016-08-27 ENCOUNTER — Other Ambulatory Visit: Payer: Self-pay

## 2016-08-27 DIAGNOSIS — Z7289 Other problems related to lifestyle: Secondary | ICD-10-CM | POA: Diagnosis not present

## 2016-08-27 DIAGNOSIS — Z7982 Long term (current) use of aspirin: Secondary | ICD-10-CM | POA: Diagnosis not present

## 2016-08-27 DIAGNOSIS — N183 Chronic kidney disease, stage 3 (moderate): Secondary | ICD-10-CM | POA: Diagnosis not present

## 2016-08-27 DIAGNOSIS — Z7952 Long term (current) use of systemic steroids: Secondary | ICD-10-CM | POA: Diagnosis not present

## 2016-08-27 DIAGNOSIS — H04203 Unspecified epiphora, bilateral lacrimal glands: Secondary | ICD-10-CM | POA: Diagnosis not present

## 2016-08-27 DIAGNOSIS — J449 Chronic obstructive pulmonary disease, unspecified: Secondary | ICD-10-CM | POA: Diagnosis not present

## 2016-08-27 DIAGNOSIS — I130 Hypertensive heart and chronic kidney disease with heart failure and stage 1 through stage 4 chronic kidney disease, or unspecified chronic kidney disease: Secondary | ICD-10-CM | POA: Diagnosis not present

## 2016-08-27 DIAGNOSIS — M353 Polymyalgia rheumatica: Secondary | ICD-10-CM | POA: Diagnosis not present

## 2016-08-27 DIAGNOSIS — R531 Weakness: Secondary | ICD-10-CM | POA: Diagnosis not present

## 2016-08-27 DIAGNOSIS — I5030 Unspecified diastolic (congestive) heart failure: Secondary | ICD-10-CM | POA: Diagnosis not present

## 2016-08-27 DIAGNOSIS — H5203 Hypermetropia, bilateral: Secondary | ICD-10-CM | POA: Diagnosis not present

## 2016-08-27 DIAGNOSIS — H35033 Hypertensive retinopathy, bilateral: Secondary | ICD-10-CM | POA: Diagnosis not present

## 2016-08-27 DIAGNOSIS — Z950 Presence of cardiac pacemaker: Secondary | ICD-10-CM | POA: Diagnosis not present

## 2016-08-27 DIAGNOSIS — R2689 Other abnormalities of gait and mobility: Secondary | ICD-10-CM | POA: Diagnosis not present

## 2016-08-27 DIAGNOSIS — H40023 Open angle with borderline findings, high risk, bilateral: Secondary | ICD-10-CM | POA: Diagnosis not present

## 2016-08-27 DIAGNOSIS — H2513 Age-related nuclear cataract, bilateral: Secondary | ICD-10-CM | POA: Diagnosis not present

## 2016-08-27 NOTE — Patient Outreach (Signed)
   Initial telephone contact for transition of care. Patient states she was having shortness of breath and had to admitted.  Patient reports being independent with her ADLs/IADLs, Patient reports living alone, however, her daughter [name] is there frequently. Patient also reports [name] provides transportation to medical appointments and does her grocery shopping for her. Patient reports having sufficient financial resources to pay for her medication and appointment copays.  Member agreed to home visit later this month for assessment of community care coordination needs and to create her care management goals.

## 2016-08-28 DIAGNOSIS — Z7982 Long term (current) use of aspirin: Secondary | ICD-10-CM | POA: Diagnosis not present

## 2016-08-28 DIAGNOSIS — I130 Hypertensive heart and chronic kidney disease with heart failure and stage 1 through stage 4 chronic kidney disease, or unspecified chronic kidney disease: Secondary | ICD-10-CM | POA: Diagnosis not present

## 2016-08-28 DIAGNOSIS — I5030 Unspecified diastolic (congestive) heart failure: Secondary | ICD-10-CM | POA: Diagnosis not present

## 2016-08-28 DIAGNOSIS — R531 Weakness: Secondary | ICD-10-CM | POA: Diagnosis not present

## 2016-08-28 DIAGNOSIS — Z7952 Long term (current) use of systemic steroids: Secondary | ICD-10-CM | POA: Diagnosis not present

## 2016-08-28 DIAGNOSIS — Z950 Presence of cardiac pacemaker: Secondary | ICD-10-CM | POA: Diagnosis not present

## 2016-08-28 DIAGNOSIS — Z7289 Other problems related to lifestyle: Secondary | ICD-10-CM | POA: Diagnosis not present

## 2016-08-28 DIAGNOSIS — J449 Chronic obstructive pulmonary disease, unspecified: Secondary | ICD-10-CM | POA: Diagnosis not present

## 2016-08-28 DIAGNOSIS — M353 Polymyalgia rheumatica: Secondary | ICD-10-CM | POA: Diagnosis not present

## 2016-08-28 DIAGNOSIS — N183 Chronic kidney disease, stage 3 (moderate): Secondary | ICD-10-CM | POA: Diagnosis not present

## 2016-08-28 DIAGNOSIS — R2689 Other abnormalities of gait and mobility: Secondary | ICD-10-CM | POA: Diagnosis not present

## 2016-08-29 DIAGNOSIS — Z7982 Long term (current) use of aspirin: Secondary | ICD-10-CM | POA: Diagnosis not present

## 2016-08-29 DIAGNOSIS — Z7952 Long term (current) use of systemic steroids: Secondary | ICD-10-CM | POA: Diagnosis not present

## 2016-08-29 DIAGNOSIS — Z7289 Other problems related to lifestyle: Secondary | ICD-10-CM | POA: Diagnosis not present

## 2016-08-29 DIAGNOSIS — R2689 Other abnormalities of gait and mobility: Secondary | ICD-10-CM | POA: Diagnosis not present

## 2016-08-29 DIAGNOSIS — M353 Polymyalgia rheumatica: Secondary | ICD-10-CM | POA: Diagnosis not present

## 2016-08-29 DIAGNOSIS — I5030 Unspecified diastolic (congestive) heart failure: Secondary | ICD-10-CM | POA: Diagnosis not present

## 2016-08-29 DIAGNOSIS — N183 Chronic kidney disease, stage 3 (moderate): Secondary | ICD-10-CM | POA: Diagnosis not present

## 2016-08-29 DIAGNOSIS — I130 Hypertensive heart and chronic kidney disease with heart failure and stage 1 through stage 4 chronic kidney disease, or unspecified chronic kidney disease: Secondary | ICD-10-CM | POA: Diagnosis not present

## 2016-08-29 DIAGNOSIS — R531 Weakness: Secondary | ICD-10-CM | POA: Diagnosis not present

## 2016-08-29 DIAGNOSIS — Z950 Presence of cardiac pacemaker: Secondary | ICD-10-CM | POA: Diagnosis not present

## 2016-08-29 DIAGNOSIS — J449 Chronic obstructive pulmonary disease, unspecified: Secondary | ICD-10-CM | POA: Diagnosis not present

## 2016-09-03 DIAGNOSIS — Z950 Presence of cardiac pacemaker: Secondary | ICD-10-CM | POA: Diagnosis not present

## 2016-09-03 DIAGNOSIS — Z7982 Long term (current) use of aspirin: Secondary | ICD-10-CM | POA: Diagnosis not present

## 2016-09-03 DIAGNOSIS — I130 Hypertensive heart and chronic kidney disease with heart failure and stage 1 through stage 4 chronic kidney disease, or unspecified chronic kidney disease: Secondary | ICD-10-CM | POA: Diagnosis not present

## 2016-09-03 DIAGNOSIS — M353 Polymyalgia rheumatica: Secondary | ICD-10-CM | POA: Diagnosis not present

## 2016-09-03 DIAGNOSIS — N183 Chronic kidney disease, stage 3 (moderate): Secondary | ICD-10-CM | POA: Diagnosis not present

## 2016-09-03 DIAGNOSIS — Z7952 Long term (current) use of systemic steroids: Secondary | ICD-10-CM | POA: Diagnosis not present

## 2016-09-03 DIAGNOSIS — R531 Weakness: Secondary | ICD-10-CM | POA: Diagnosis not present

## 2016-09-03 DIAGNOSIS — J449 Chronic obstructive pulmonary disease, unspecified: Secondary | ICD-10-CM | POA: Diagnosis not present

## 2016-09-03 DIAGNOSIS — Z7289 Other problems related to lifestyle: Secondary | ICD-10-CM | POA: Diagnosis not present

## 2016-09-03 DIAGNOSIS — R2689 Other abnormalities of gait and mobility: Secondary | ICD-10-CM | POA: Diagnosis not present

## 2016-09-03 DIAGNOSIS — I5030 Unspecified diastolic (congestive) heart failure: Secondary | ICD-10-CM | POA: Diagnosis not present

## 2016-09-04 ENCOUNTER — Other Ambulatory Visit: Payer: Self-pay

## 2016-09-05 NOTE — Patient Outreach (Signed)
[facility] [facility]) Care Management   09/05/2016  [name] 11/08/1946 [id number]  [name] is an 70 y.o. female  Subjective:  I would like to know more about heart failure. I had a stroke and I cannot remember everything.  Objective:   ROS Patient was well dressed, lives in a well kept home. Patient was able to ambulate without assistive devices.   Physical Exam ROS Encounter Medications:   Outpatient Encounter Prescriptions as of 09/04/2016  Medication Sig  . acetaminophen (TYLENOL) 325 MG tablet Take 325 mg by mouth daily as needed (pain).  [name] albuterol (PROVENTIL) (2.5 MG/3ML) 0.083% nebulizer solution Take 3 mLs (2.5 mg total) by nebulization every 6 (six) hours as needed for wheezing or shortness of breath.  . ALPRAZolam (XANAX) 0.5 MG tablet Take 1 tablet (0.5 mg total) by mouth at bedtime as needed for anxiety.  [name] aspirin EC 81 MG tablet Take 81 mg by mouth daily.   . bisacodyl (DULCOLAX) 10 MG suppository Place 10 mg rectally. Take every other day as needed.  [name] CALCIUM PO Take 1 tablet by mouth as directed. As needed once a week  . ferrous sulfate 325 (65 FE) MG tablet TAKE 1 TABLET BY MOUTH TWICE DAILY WITH MEALS TO KEEP BLOOD COUNT UP  . furosemide (LASIX) 40 MG tablet Take 1 tablet (40 mg total) by mouth daily.  . nitroGLYCERIN (NITROSTAT) 0.4 MG SL tablet Place 1 tablet (0.4 mg total) under the tongue every 5 (five) minutes as needed for chest pain.  [name] omeprazole (PRILOSEC) 20 MG capsule Take 1 capsule (20 mg total) by mouth daily.  . polyethylene glycol (MIRALAX / GLYCOLAX) packet Take 17 g by mouth daily.  . rosuvastatin (CRESTOR) 20 MG tablet Take 1 tablet (20 mg total) by mouth daily.  [name] senna (SENOKOT) 8.6 MG tablet Take 1 tablet by mouth at bedtime as needed for constipation.   . trolamine salicylate (ASPERCREME) 10 % cream Apply 1 application topically 2 (two) times daily as needed for muscle pain.   [name] Salicylate (QC  ARTHRITIS EX) Apply 1 application topically daily as needed (for pain).   . predniSONE (STERAPRED UNI-PAK 21 TAB) 10 MG (21) TBPK tablet Take by mouth daily. Take 81m tomorrow 9/8, Take 438m9/9-9/11, Take 3014m/12-9/14, Take 6m25m15-9/17, Take 10mg10m18-9/20 (Patient not taking: Reported on 09/04/2016)   No facility-administered encounter medications on file as of 09/04/2016.    [facility] Problem [facility]   Flowsheet Row Most Recent Value  Care Plan Problem One  knowledge deficit related to heart failure managemewnt  Role Documenting the Problem One  Care Management Coordinator  Care Plan for Problem One  Active  THN Long Term Goal (31-90 days)  In the next 31 days, patient will be able to verbalize the 3 zones of the Heart Failure Action Plan  THN Long Term Goal Start Date  09/04/16  Interventions for Problem One Long Term Goal  Initial home viist for community care coordination  THN CM Short Term Goal #1 (0-30 days)  in the next 28 days, patient will meet with [facility]  [facility] Start Date  09/04/16  Interventions for Short Term Goal #1  [facility]     Functional Status:   In your present state of health, do you have any difficulty performing the following activities: 08/27/2016 08/17/2016  Hearing? N N  Vision? Y [name]  Difficulty concentrating or making decisions? N N  Walking or climbing stairs? Y N  Dressing or bathing? N N  Doing errands, shopping? Y N  Preparing Food and eating ? Y -  Using the Toilet? N -  In the past six months, have you accidently leaked urine? N -  Do you have problems with loss of bowel control? N -  Managing your Medications? N -  Managing your Finances? N -  Housekeeping or managing your Housekeeping? N -  Some recent data might be hidden   Fall Risk  08/27/2016 08/26/2016 12/31/2015 10/05/2015 08/29/2015  Falls in the past year? _0   Risk for fall due to : Impaired  mobility;Impaired vision;Medication side effect - - - -   Fall/Depression Screening:    PHQ 2/9 Scores 08/27/2016 08/26/2016 02/07/2016 12/31/2015 10/05/2015 08/29/2015 06/06/2015  PHQ - 2 Score 0 0 0 0 0 1 0    Assessment:   Patient readily participated in education session, listened intensely to videos and instructions.  Plan:  Telephone contact later this month

## 2016-09-08 NOTE — Progress Notes (Signed)
PCP: Steve Rattler, DO Primary Cardiologist:  Dr Oval Linsey Electrophysiologist: Allred  [name] is a 70 y.o. female who presents today for electrophysiology followup.   She is seen today for routine EP follow up. She continues to have slow recovery from hospitalization. She has finished steroid taper and remains with hoarseness and a feeling that she can't cough up phlegm. Today, she denies symptoms of CP, lower extremity edema, dizziness, presyncope, or syncope.  The patient is otherwise without complaint today.   Past Medical History:  Diagnosis Date  . Anemia   . Angina   . Angioedema    2/2 ACE  . Arteriovenous malformation of stomach   . Arthritis   . Asthma   . AVM (arteriovenous malformation) of colon    small intestine; stomach  . Blood transfusion   . CHF (congestive heart failure) ([place])   . Complete heart block ([place])    s/p PPM 1998  . DDD (degenerative disc disease)   . Depression   . Diastolic heart failure   . Fatty liver 07/26/10  . GERD (gastroesophageal reflux disease)   . GI bleed   . History of alcohol abuse Stopped Fall 2012  . History of tobacco use Quit Fall 2012  . Hx of cardiovascular stress test    a. Lexiscan Myoview (10/15):  Small inferolateral and apical defect c/w scar and poss soft tissue attenuation, no ischemia, EF 42%  . Hx of colonic polyp 08/13/10   adenomatous  . Hx of colonoscopy   . Hyperlipidemia   . Hyperlipidemia   . Hypertension   . Hypertrophic cardiomyopathy ([place])    dx by Dr [name] 2009  . Iron deficiency anemia   . Panic attack   . Panic attacks   . Permanent atrial fibrillation ([place])   . Renal failure    baseline creatinine 1.6  . Right arm pain 01/08/2012  . RLS (restless legs syndrome)    Dx 06/2007  . Shortness of breath    sob on exertation  . Sleep apnea    Past Surgical History:  Procedure Laterality Date  . CARDIAC CATHETERIZATION    . CARDIAC CATHETERIZATION N/A 08/19/2016   Procedure:  Right/Left Heart Cath and Coronary Angiography;  Surgeon: Jolaine Artist, MD;  Location: [facility];  Service: Cardiovascular;  Laterality: N/A;  . ESOPHAGOGASTRODUODENOSCOPY  12/23/2011   Procedure: ESOPHAGOGASTRODUODENOSCOPY (EGD);  Surgeon: [name], MD;  Location: [facility];  Service: Endoscopy;  Laterality: N/A;  . EXTUBATION (ENDOTRACHEAL) IN OR N/A 07/21/2016   Procedure: EXTUBATION (ENDOTRACHEAL) IN OR;  Surgeon: [name], MD;  Location: [facility];  Service: ENT;  Laterality: N/A;  . GIVENS CAPSULE STUDY  12/23/2011   Procedure: GIVENS CAPSULE STUDY;  Surgeon: [name], MD;  Location: [facility];  Service: Endoscopy;  Laterality: N/A;  . PACEMAKER INSERTION  1998   st jude, most recent gen change by [name] 4/12  . TUBAL LIGATION  04/01/2000    Current Outpatient Prescriptions  Medication Sig Dispense Refill  . acetaminophen (TYLENOL) 325 MG tablet Take 325 mg by mouth daily as needed (pain).    [name] albuterol (PROVENTIL) (2.5 MG/3ML) 0.083% nebulizer solution Take 3 mLs (2.5 mg total) by nebulization every 6 (six) hours as needed for wheezing or shortness of breath. 150 mL 1  . ALPRAZolam (XANAX) 0.5 MG tablet Take 1 tablet (0.5 mg total) by mouth at bedtime as needed for anxiety. 15 tablet 0  . aspirin EC 81  MG tablet Take 81 mg by mouth daily.     . bisacodyl (DULCOLAX) 10 MG suppository Place 10 mg rectally. Take every other day as needed.    [name] CALCIUM PO Take 1 tablet by mouth as directed. As needed once a week    . ferrous sulfate 325 (65 FE) MG tablet TAKE 1 TABLET BY MOUTH TWICE DAILY WITH MEALS TO KEEP BLOOD COUNT UP 60 tablet 11  . furosemide (LASIX) 40 MG tablet Take 1 tablet (40 mg total) by mouth daily. 90 tablet 3  . nitroGLYCERIN (NITROSTAT) 0.4 MG SL tablet Place 1 tablet (0.4 mg total) under the tongue every 5 (five) minutes as needed for chest pain. 25 tablet 1  . omeprazole (PRILOSEC) 20 MG capsule Take 1 capsule (20 mg total) by mouth daily. 30 capsule  6  . polyethylene glycol (MIRALAX / GLYCOLAX) packet Take 17 g by mouth daily. 14 each 0  . rosuvastatin (CRESTOR) 20 MG tablet Take 1 tablet (20 mg total) by mouth daily. 90 tablet 3  . senna (SENOKOT) 8.6 MG tablet Take 1 tablet by mouth at bedtime as needed for constipation.     . trolamine salicylate (ASPERCREME) 10 % cream Apply 1 application topically 2 (two) times daily as needed for muscle pain.     [name] Salicylate (QC ARTHRITIS EX) Apply 1 application topically daily as needed (for pain).      No current facility-administered medications for this visit.    ROS- all systems are reviewed and negative except as per HPI above  Physical Exam: Vitals:   09/09/16 1213  BP: 130/80  Pulse: 70  SpO2: 97%  Weight: 167 lb 12.8 oz (76.1 kg)  Height: 5\' 5"  (1.651 m)    GEN- The patient is well appearing, alert and oriented x 3 today.   Head- normocephalic, atraumatic Eyes-  Sclera clear, conjunctiva pink Ears- hearing intact Oropharynx- clear Neck- no stridor Lungs- Clear to ausculation bilaterally, normal work of breathing Chest- pacemaker pocket is well healed Heart- Regular rate and rhythm, (paced) GI- soft, NT, ND, + BS Extremities- no clubbing, cyanosis, or edema  Pacemaker interrogation- reviewed in detail today,  See PACEART report  Assessment and Plan:  1. Complete heart block Recent normal pacemaker function See scanned report  2. Permanent afib Not a candidate for long term anticoagulation due to prior GI bleeding or for watchman given recent difficulty with screening TEE.  I would favor a conservative approach going forward.  3. HTN Stable No change required today  4. Atypical chest pain (chronic) Recent cath revealed normal cors  5. Chronic systolic dysfunction  Euvolemic on exam Continue current therapy Follow up Dr Oval Linsey as scheduled   6. Chronic blood loss anemia Recent CBC stable Not a candidate for long term anticoagulation  7. Recent  respiratory issues Follow up scheduled for next week with ENT She is currently off steroids Encouraged use of Albuterol nebulizer as needed    Follow-up with Dr Oval Linsey as scheduled, Delilah Shan, Dr Rayann Heman 9 months  Chanetta Marshall, NP  09/09/2016 12:30 PM

## 2016-09-09 ENCOUNTER — Other Ambulatory Visit: Payer: Self-pay

## 2016-09-09 ENCOUNTER — Encounter: Payer: Self-pay | Admitting: Nurse Practitioner

## 2016-09-09 ENCOUNTER — Ambulatory Visit (INDEPENDENT_AMBULATORY_CARE_PROVIDER_SITE_OTHER): Payer: Medicare Other | Admitting: Nurse Practitioner

## 2016-09-09 VITALS — BP 130/80 | HR 70 | Ht 65.0 in | Wt 167.8 lb

## 2016-09-09 DIAGNOSIS — I442 Atrioventricular block, complete: Secondary | ICD-10-CM | POA: Diagnosis not present

## 2016-09-09 DIAGNOSIS — I482 Chronic atrial fibrillation: Secondary | ICD-10-CM | POA: Diagnosis not present

## 2016-09-09 DIAGNOSIS — I5022 Chronic systolic (congestive) heart failure: Secondary | ICD-10-CM | POA: Diagnosis not present

## 2016-09-09 DIAGNOSIS — I4821 Permanent atrial fibrillation: Secondary | ICD-10-CM

## 2016-09-09 NOTE — Patient Outreach (Signed)
[facility]) Care Management  09/09/2016   [name] 29-Jun-1946 [id number]  Subjective:  I am happy to say, I am doing better I don't have any shortness of breath or swelling.    Objective:  Telephone contact   Current Medications:  Current Outpatient Prescriptions  Medication Sig Dispense Refill  . acetaminophen (TYLENOL) 325 MG tablet Take 325 mg by mouth daily as needed (pain).    [name] albuterol (PROVENTIL) (2.5 MG/3ML) 0.083% nebulizer solution Take 3 mLs (2.5 mg total) by nebulization every 6 (six) hours as needed for wheezing or shortness of breath. 150 mL 1  . ALPRAZolam (XANAX) 0.5 MG tablet Take 1 tablet (0.5 mg total) by mouth at bedtime as needed for anxiety. 15 tablet 0  . aspirin EC 81 MG tablet Take 81 mg by mouth daily.     . bisacodyl (DULCOLAX) 10 MG suppository Place 10 mg rectally. Take every other day as needed.    [name] CALCIUM PO Take 1 tablet by mouth as directed. As needed once a week    . ferrous sulfate 325 (65 FE) MG tablet TAKE 1 TABLET BY MOUTH TWICE DAILY WITH MEALS TO KEEP BLOOD COUNT UP 60 tablet 11  . furosemide (LASIX) 40 MG tablet Take 1 tablet (40 mg total) by mouth daily. 90 tablet 3  . nitroGLYCERIN (NITROSTAT) 0.4 MG SL tablet Place 1 tablet (0.4 mg total) under the tongue every 5 (five) minutes as needed for chest pain. 25 tablet 1  . omeprazole (PRILOSEC) 20 MG capsule Take 1 capsule (20 mg total) by mouth daily. 30 capsule 6  . polyethylene glycol (MIRALAX / GLYCOLAX) packet Take 17 g by mouth daily. 14 each 0  . rosuvastatin (CRESTOR) 20 MG tablet Take 1 tablet (20 mg total) by mouth daily. 90 tablet 3  . senna (SENOKOT) 8.6 MG tablet Take 1 tablet by mouth at bedtime as needed for constipation.     . trolamine salicylate (ASPERCREME) 10 % cream Apply 1 application topically 2 (two) times daily as needed for muscle pain.     [name] Salicylate (QC ARTHRITIS EX) Apply 1 application topically daily as needed (for pain).       No current facility-administered medications for this visit.    [facility] CM Care Plan Problem One   Flowsheet Row Most Recent Value  Care Plan Problem One  knowledge deficit related to heart failure managemewnt  Role Documenting the Problem One  Care Management Coordinator  Care Plan for Problem One  Active  THN Long Term Goal (31-90 days)  In the next 31 days, patient will be able to verbalize the 3 zones of the Heart Failure Action Plan  THN Long Term Goal Start Date  09/04/16  Interventions for Problem One Long Term Goal  telephone contact with patinet for assessment of communiyt care coordination needs.   THN CM Short Term Goal #1 (0-30 days)  in the next 28 days, patient will meet with [facility] for Health education  [facility] CM Short Term Goal #1 Start Date  09/04/16  Interventions for Short Term Goal #1  this RNCM and patient agreed to home visit within the next month for additional chronic disease education      Functional Status:  In your present state of health, do you have any difficulty performing the following activities: 08/27/2016 08/17/2016  Hearing? N N  Vision? Y N  Difficulty concentrating or making decisions? N N  Walking or climbing stairs? Y N  Dressing or  bathing? N N  Doing errands, shopping? Y N  Preparing Food and eating ? Y -  Using the Toilet? N -  In the past six months, have you accidently leaked urine? N -  Do you have problems with loss of bowel control? N -  Managing your Medications? N -  Managing your Finances? N -  Housekeeping or managing your Housekeeping? N -  Some recent data might be hidden    Fall/Depression Screening: PHQ 2/9 Scores 08/27/2016 08/26/2016 02/07/2016 12/31/2015 10/05/2015 08/29/2015 06/06/2015  PHQ - 2 Score 0 0 0 0 0 1 0    Assessment:  Patient has very good family support with her daughter being her primary support. Patient states she feels better, is compliant with medication and treatment regimen.   Plan:  Telephone contact with  patient within hte next month.

## 2016-09-09 NOTE — Patient Instructions (Signed)
Medication Instructions:   Your physician recommends that you continue on your current medications as directed. Please refer to the Current Medication list given to you today.   If you need a refill on your cardiac medications before your next appointment, please call your pharmacy.  Labwork: NONE ORDER TODAY   Testing/Procedures: NONE ORDER TODAY    Follow-Up:  Your physician wants you to follow-up in:  IN 9   MONTHS WITH DR Rayann Heman  You will receive a reminder letter in the mail two months in advance. If you don't receive a letter, please call our office to schedule the follow-up appointment.      Any Other Special Instructions Will Be Listed Below (If Applicable).

## 2016-09-10 ENCOUNTER — Ambulatory Visit (INDEPENDENT_AMBULATORY_CARE_PROVIDER_SITE_OTHER): Payer: Medicare Other | Admitting: Family Medicine

## 2016-09-10 VITALS — BP 137/76 | HR 70 | Temp 97.6°F | Wt 167.6 lb

## 2016-09-10 DIAGNOSIS — J384 Edema of larynx: Secondary | ICD-10-CM | POA: Diagnosis not present

## 2016-09-10 DIAGNOSIS — R0989 Other specified symptoms and signs involving the circulatory and respiratory systems: Secondary | ICD-10-CM | POA: Diagnosis not present

## 2016-09-10 NOTE — Patient Instructions (Addendum)
Purchase PLAIN robitussin and take daily as directed as needed to help with congestion.  Follow up with ENT as scheduled.

## 2016-09-10 NOTE — Progress Notes (Signed)
   Subjective: CC: throat irritation [name] is a 70 y.o. female presenting to clinic today for same day appointment. PCP: [name], [name] Concerns today include:  1. Throat irritation Patient reports that she went in for a cardiac procedure 07/10/2016, which required intubation.  She had a reaction to the propofol that caused her to be hypotensive and stop breathing.  She had a prolonged course on the ventilator.  She has had subsequent sore throat, wheezing, difficulty coughing up phlegm.  She notes that she is s/p steroid pack that was completed last week.  She does not notice any improvement.  She also has been using Albuterol nebs prn w/out much improvement.  Endorses some wheeze.  No fevers, chills.  Social History Reviewed: non smoker. FamHx and MedHx reviewed.  Please see EMR. Health Maintenance: flu shot due  ROS: Per HPI  Objective: Office vital signs reviewed. BP 137/76 (BP Location: Left Arm, Patient Position: Sitting, Cuff Size: Normal)   Pulse 70   Temp 97.6 F (36.4 C) (Oral)   Wt 167 lb 9.6 oz (76 kg)   SpO2 100%   BMI 27.89 kg/m   Physical Examination:  General: Awake, alert, well nourished, No acute distress, accompanied to visit by daughter Throat: moist mucus membranes, no erythema, airway appears patent  Cardio: regular rate and rhythm, S1S2 heard, no murmurs appreciated Pulm: mild expiratory and inspiratory wheeze appreciated, good air movement, no rhonchi or rales, normal WOB on room air.  Speech raspy.  Assessment/ Plan: 70 y.o. female   1. Chest congestion.  Discussed Robitussin with codeine, but I have some concern for excessive sedation/ respiratory depression given recent adverse reaction with propofol.  Patient wishes to use only guaifenesin for congestion.  Difficult to tell on exam if wheeze from upper airway.  - Advised to continue Albuterol inhaler/ nebulizer for wheeze, sob q6 for next 2 days then prn. - Strict return  precautions/ ED precautions reviewed.  Patient voiced good understanding.  2. Vocal cord edema. Considered repeating steroid but patient reports poor response.  I believe that patient would most benefit from reevaluation by ENT.  Her symptoms are most likely related to a small airway that sustained trauma during emergent intubation. - Tylenol prn pain - Follow up with ENT as scheduled.  [name], [name] PGY-3, [facility]

## 2016-09-15 DIAGNOSIS — J9589 Other postprocedural complications and disorders of respiratory system, not elsewhere classified: Secondary | ICD-10-CM | POA: Diagnosis not present

## 2016-09-15 DIAGNOSIS — R490 Dysphonia: Secondary | ICD-10-CM | POA: Diagnosis not present

## 2016-09-15 DIAGNOSIS — R061 Stridor: Secondary | ICD-10-CM | POA: Diagnosis not present

## 2016-09-15 DIAGNOSIS — R0600 Dyspnea, unspecified: Secondary | ICD-10-CM | POA: Diagnosis not present

## 2016-09-15 DIAGNOSIS — K219 Gastro-esophageal reflux disease without esophagitis: Secondary | ICD-10-CM | POA: Diagnosis not present

## 2016-09-17 ENCOUNTER — Encounter (HOSPITAL_COMMUNITY): Payer: Self-pay | Admitting: *Deleted

## 2016-09-17 NOTE — Anesthesia Preprocedure Evaluation (Addendum)
Anesthesia Evaluation  Patient identified by MRN, date of birth, ID band  Reviewed: Allergy & Precautions, NPO status , Patient's Chart, lab work & pertinent test results, Unable to perform ROS - Chart review only  History of Anesthesia Complications (+) DIFFICULT AIRWAY and history of anesthetic complications  Airway Mallampati: I      Comment: 6.5 oral ETT placed 8/1 with glidescope #4, 2 attempts Dental  (+) Teeth Intact   Pulmonary sleep apnea , COPD, former smoker,  Acute hypoxic respiratory failure secondary to complications from TEE   breath sounds clear to auscultation       Cardiovascular hypertension, + angina +CHF  + dysrhythmias (not a candidate for anticoagulation due to history of GI bleed) Atrial Fibrillation + pacemaker (St. Jude, complete heart block)  Rhythm:Irregular  TEE 7/27:  ?stunned LV with severe hypokinesis and EF 20-25% and non obstructive HOCM  TTE 7/28: LVEF 55-60%, moderate asymmetric septal hypertrophy, intermittent diastolic dysfunction (AF), no RWMA, PA peak 62  Impressions: - The patient was in atrial fibrillation. Normal LV size with   moderate asymmetric septal hypertrophy. No LVOT gradient or   mitral valve SAM. EF 55-60%. This could be consistent with   hypertrophic nonobstructive cardiomyopathy. There was mild MR.   Mildly dilated RV with moderately decreased systolic function.   Moderate pulmonary hypertension.   Neuro/Psych PSYCHIATRIC DISORDERS Anxiety Depression Questionable seizures, EEG negative    GI/Hepatic GERD  ,(+)     substance abuse (h/o alcohol abuse)  , Fatty liver H/o GI bleed   Endo/Other  Hyperglycemia likely secondary to high-dose steroids for airway edema  Renal/GU CRFRenal disease (stage 3)     Musculoskeletal  (+) Arthritis , Polymyalgia rheumatic on chronic steroids (prednisone 5mg  daily)    Abdominal   Peds  Hematology  (+) Blood dyscrasia, anemia ,    Anesthesia Other Findings 70yo female former smoker, polymyalgia rheumatica, HTN, hypertrophic cardiomyopathy, CKD, AFib not candidate for anticoagulation r/t prior GI bleeding who was admitted 7/27 for elective TEE to evaluate L atrial appendage and place watchman device (left atrial appendage closure system for long term stroke prevention).  Pt was anemic on admit and decision was made to not proceed with watchman device (as it would require asa, coumadin for short time afterwards) but TEE was done to eval function and anatomy.  MD had significant difficulty passing the probe, which was eventually passed by anesthesia.  TEE showed ?stunned LV with severe hypokinesis and EF 20-25% and non obstructive HOCM.  However she had significant hypotension and increasing respiratory distress so she was intubated, started on pressors and tx to ICU post procedure.    Reproductive/Obstetrics                            Anesthesia Physical  Anesthesia Plan  ASA: IV  Anesthesia Plan: General   Post-op Pain Management:    Induction: Intravenous  Airway Management Planned: Oral ETT  Additional Equipment: Arterial line  Intra-op Plan:   Post-operative Plan: Extubation in OR  Informed Consent: I have reviewed the patients History and Physical, chart, labs and discussed the procedure including the risks, benefits and alternatives for the proposed anesthesia with the patient or authorized representative who has indicated his/her understanding and acceptance.   Dental advisory given  Plan Discussed with: CRNA  Anesthesia Plan Comments:        Anesthesia Quick Evaluation

## 2016-09-17 NOTE — Progress Notes (Signed)
Anesthesia Chart Review: SAME DAY WORK-UP.  Patient is a 70 year old female scheduled for direct laryngoscopy, bronchoscopy, possible laser, possible jet ventilation on 09/18/16 (first case) by Dr. Erik Obey. Patient has posterior subglottic intubation granuloma with progressive hoarseness, stridor, and dyspnea following prolonged intubation in July with some blunt trauma to larynx (following difficult TEE probe insertion and subsequent intubations X 2). Dr. Erik Obey spoke with Dr. Rayann Heman on 09/15/16 and later wrote, "I think we need to go to the operating room and attempt to assess this further and remove it. I spoke with Dr. Rayann Heman today. She does have a pacemaker which will need to be turned off. She has significant left ventricular dysfunction but unlikely to be further improved with additional cardiac attention. I will speak with anesthesia about the procedure."  History includes polymyalgia rheumatica, HTN, hypertrophic cardiomyopathy, afib, diastolic CHF, CHB s/p PPM '86 (latest replacement 04/08/11; [employer] Jude Verity ADx XL DR, model 5357M-S serial # [id number]), angioedema (due to ACE inhibitor), CKD, HLD, OSA, asthma, former smoker (quit '12), ETOH abuse (quit '12), GERD, iron deficiency anemia, AVM (GI tract), depression, anxiety with panic attacks.  - Admission 07/10/16-07/26/16: On 07/10/16, TEE with insertion of watchman device (LA appendage closure system for long term stroke prevention) was planned as she is not a candidate for long term anticoagulation due to GI bleed. However, her H/H were 8.5/29.3, so watchman device insertion canceled (as it would require short term ASA and warfarin) with plans to go ahead with TEE under MAC. The cardiologist had difficultly passing the TEE probe, but it was eventually passed by anesthesia. TEE showed "stunned LV with EF 20-25% non obbstructive HOCM no significant MR or LVOT gradient and no effusion." Patient then developed significant hypotension and increasing  respiratory distress and required intubation, pressors, and transfer to ICU. Dr. Rayann Heman felt hypotension and acutely reduced EF may be a result of propofol. Extubated 07/15/06 bur required reintubation. She was extubated in the OR on 07/21/16 (as she was a difficult intubation 07/10/16 due to large tongue and edematous airway; Laryngoscope 3 and McGraph used; on 07/15/16 also difficult due to cord edema and could not pass 7.0 ETT so 6.5 ETT passed over glidescope stylet by PCCM NP). Due to difficulty with screening TEE, she is no longer being considered for watchman device at this point with medical management of her afib recommended. - Admission 08/17/16-08/21/16 for pulmonary edema. RHC/LHC done (see below).  - PCP is Dr. Lucila Maine with [facility]. - Primary cardiologist is Dr. Skeet Latch. Last seen by (non-EP) cardiologist Dr. Dorris Carnes on 08/20/16 during hospitalization. She reviewed reviewed July echo and September cath result. She felt MR did not appear severe, but probably moderate that may increase with activity. Medical therapy with medications, salt/fluid restriction recommended for now.  - EP cardiologist is Dr. Thompson Grayer, last visit with Chanetta Marshall, NP 09/09/16.   Meds include albuterol, Xanax, ASA 81 mg, 65 Fe, Lasix, Nitro, Prilosec, Crestor.  08/17/16 EKG: V-paced rhythm.   08/19/16 RHC/LHC: Findings: Ao = 145/79 (105) LV = 139/10/18 RA = 7 RV = 68/9 PA = 64/18 (38) PCW = 22 (v waves to 37) Fick cardiac output/index = 5.4/2.9 PVR = 3.0 WU Ao sat = 94% PA sat = 59%, 59% Assessment: 1. Normal coronary arteries 2. Mild to moderate pulmonary HTN with normal PVR (mostly left-sided)  3. Prominent v-waves in PCWP tracing suggestive of severe diastolic dysfunction or significant mitral regurgitation  4. Normal LV function and cardiac output  Plan/Discussion: Continue medical therapy. Optimize diuretic regimen. Review echo to assess degree of MR.   07/11/16 Echo: Study Conclusions -  Left ventricle: The cavity size was normal. Systolic function was   normal. The estimated ejection fraction was in the range of 55%   to 60%. There was moderate asymmetric septal hypertrophy.   Indeterminant diastolic function (atrial fibrillation). No   significant LV outflow tract gradient. Wall motion was normal;   there were no regional wall motion abnormalities. - Aortic valve: There was no stenosis. - Mitral valve: No mitral valve SAM. Mildly calcified annulus.   There was mild regurgitation. - Left atrium: The atrium was moderately dilated. - Right ventricle: The cavity size was mildly dilated. Pacer wire   or catheter noted in right ventricle. Systolic function was   moderately reduced. - Right atrium: The atrium was mildly dilated. - Tricuspid valve: Peak RV-RA gradient (S): 47 mm Hg. - Pulmonary arteries: PA peak pressure: 62 mm Hg (S). - Systemic veins: IVC measured 2.3 cm with < 50% respirophasic   variation, suggesting RA pressure 15 mmHg. Impressions: - The patient was in atrial fibrillation. Normal LV size with   moderate asymmetric septal hypertrophy. No LVOT gradient or   mitral valve SAM. EF 55-60%. This could be consistent with   hypertrophic nonobstructive cardiomyopathy. There was mild MR.   Mildly dilated RV with moderately decreased systolic function.   Moderate pulmonary hypertension.  07/13/16 EEG: EEG Abnormalities: This is an abnormal EEG due to the presence of moderate to severe posterior background slowing.  Clinical Interpretation: This was an abnormal EEG due to the presence of moderate to severe posterior background slowing. The presence of slowing is a nonspecific finding and is indicative of moderate to severe encephalopathy. No epileptiform discharges were observed.  08/17/16 CXR (done during admission for pulmonary edema): FINDINGS: The heart size and mediastinal contours are stable. The heart size is enlarged. Cardiac pacemaker is unchanged. There is  pulmonary edema. There is no focal pneumonia or pleural effusion. The visualized skeletal structures are stable. IMPRESSION: Congestive heart failure.  She is for labs on arrival. Perioperative PPM RX form still pending. Per Dr. Noreene Filbert conversation with Dr. Rayann Heman, [employer] rep will need to see patient preoperatively to reprogram device.   Reviewed above with anesthesiologist Dr. Cheral Bay. Further evaluation by her anesthesiologist on the day of surgery. Plan to have advanced airway equipment available and consider having Dr. Erik Obey present on induction.   [facility] Short Stay Center/Anesthesiology Phone [phone] 09/17/2016 4:40 PM

## 2016-09-17 NOTE — Progress Notes (Signed)
Pt made aware to stop taking Vitamins, fish oil and herbal medications. Do not take any NSAIDs ie: Ibuprofen, Advil, Naproxen, BC and Goody Powder. Pt verbalized understanding of all pre-op instructions. Spoke with Larrie Kass, CMA, to clarify pt pre-op instructions regarding Aspirin and to have MD enter orders; awaiting F/U phone call. According to Amy, MD has consulted with anesthesia and pt cardiologist regarding procedure.  Spoke with Ebony Hail, [place], Anesthesia, regarding pt history.

## 2016-09-18 ENCOUNTER — Ambulatory Visit (HOSPITAL_COMMUNITY): Payer: Medicare Other | Admitting: Anesthesiology

## 2016-09-18 ENCOUNTER — Observation Stay (HOSPITAL_COMMUNITY)
Admission: AD | Admit: 2016-09-18 | Discharge: 2016-09-19 | Disposition: A | Discharge status: Home / Self Care | Payer: Medicare Other | Source: Ambulatory Visit | Attending: Otolaryngology | Admitting: Otolaryngology

## 2016-09-18 ENCOUNTER — Other Ambulatory Visit: Payer: Self-pay

## 2016-09-18 ENCOUNTER — Encounter (HOSPITAL_COMMUNITY)
Admission: AD | Disposition: A | Discharge status: Home / Self Care | Payer: Self-pay | Source: Ambulatory Visit | Attending: Otolaryngology

## 2016-09-18 ENCOUNTER — Encounter (HOSPITAL_COMMUNITY): Payer: Self-pay | Admitting: *Deleted

## 2016-09-18 DIAGNOSIS — Z87891 Personal history of nicotine dependence: Secondary | ICD-10-CM | POA: Diagnosis not present

## 2016-09-18 DIAGNOSIS — I5030 Unspecified diastolic (congestive) heart failure: Secondary | ICD-10-CM | POA: Diagnosis not present

## 2016-09-18 DIAGNOSIS — G4730 Sleep apnea, unspecified: Secondary | ICD-10-CM | POA: Insufficient documentation

## 2016-09-18 DIAGNOSIS — M1990 Unspecified osteoarthritis, unspecified site: Secondary | ICD-10-CM | POA: Insufficient documentation

## 2016-09-18 DIAGNOSIS — I482 Chronic atrial fibrillation: Secondary | ICD-10-CM | POA: Insufficient documentation

## 2016-09-18 DIAGNOSIS — I272 Pulmonary hypertension, unspecified: Secondary | ICD-10-CM | POA: Diagnosis not present

## 2016-09-18 DIAGNOSIS — D509 Iron deficiency anemia, unspecified: Secondary | ICD-10-CM | POA: Diagnosis not present

## 2016-09-18 DIAGNOSIS — N189 Chronic kidney disease, unspecified: Secondary | ICD-10-CM | POA: Diagnosis not present

## 2016-09-18 DIAGNOSIS — J386 Stenosis of larynx: Secondary | ICD-10-CM | POA: Diagnosis present

## 2016-09-18 DIAGNOSIS — E785 Hyperlipidemia, unspecified: Secondary | ICD-10-CM | POA: Diagnosis not present

## 2016-09-18 DIAGNOSIS — Z8249 Family history of ischemic heart disease and other diseases of the circulatory system: Secondary | ICD-10-CM | POA: Insufficient documentation

## 2016-09-18 DIAGNOSIS — I4891 Unspecified atrial fibrillation: Secondary | ICD-10-CM | POA: Diagnosis not present

## 2016-09-18 DIAGNOSIS — J9601 Acute respiratory failure with hypoxia: Secondary | ICD-10-CM | POA: Diagnosis not present

## 2016-09-18 DIAGNOSIS — Z950 Presence of cardiac pacemaker: Secondary | ICD-10-CM | POA: Diagnosis not present

## 2016-09-18 DIAGNOSIS — F419 Anxiety disorder, unspecified: Secondary | ICD-10-CM | POA: Diagnosis not present

## 2016-09-18 DIAGNOSIS — Z7982 Long term (current) use of aspirin: Secondary | ICD-10-CM | POA: Insufficient documentation

## 2016-09-18 DIAGNOSIS — K219 Gastro-esophageal reflux disease without esophagitis: Secondary | ICD-10-CM | POA: Diagnosis not present

## 2016-09-18 DIAGNOSIS — I130 Hypertensive heart and chronic kidney disease with heart failure and stage 1 through stage 4 chronic kidney disease, or unspecified chronic kidney disease: Secondary | ICD-10-CM | POA: Diagnosis not present

## 2016-09-18 DIAGNOSIS — L980 Pyogenic granuloma: Secondary | ICD-10-CM | POA: Diagnosis not present

## 2016-09-18 DIAGNOSIS — J387 Other diseases of larynx: Secondary | ICD-10-CM | POA: Diagnosis not present

## 2016-09-18 DIAGNOSIS — R061 Stridor: Secondary | ICD-10-CM | POA: Diagnosis present

## 2016-09-18 DIAGNOSIS — Z79899 Other long term (current) drug therapy: Secondary | ICD-10-CM | POA: Insufficient documentation

## 2016-09-18 DIAGNOSIS — J449 Chronic obstructive pulmonary disease, unspecified: Secondary | ICD-10-CM | POA: Insufficient documentation

## 2016-09-18 HISTORY — DX: Pneumonia, unspecified organism: J18.9

## 2016-09-18 HISTORY — PX: DIRECT LARYNGOSCOPY: SHX5326

## 2016-09-18 HISTORY — DX: Other complications of anesthesia, initial encounter: T88.59XA

## 2016-09-18 HISTORY — DX: Headache: R51

## 2016-09-18 HISTORY — DX: Adverse effect of unspecified anesthetic, initial encounter: T41.45XA

## 2016-09-18 LAB — BASIC METABOLIC PANEL
Anion gap: 11 (ref 5–15)
BUN: 19 mg/dL (ref 6–20)
CALCIUM: 10 mg/dL (ref 8.9–10.3)
CO2: 28 mmol/L (ref 22–32)
CREATININE: 1.58 mg/dL — AB (ref 0.44–1.00)
Chloride: 99 mmol/L — ABNORMAL LOW (ref 101–111)
GFR calc non Af Amer: 32 mL/min — ABNORMAL LOW (ref >60)
GFR, EST AFRICAN AMERICAN: 37 mL/min — AB (ref >60)
Glucose, Bld: 138 mg/dL — ABNORMAL HIGH (ref 65–99)
Potassium: 3.5 mmol/L (ref 3.5–5.1)
Sodium: 138 mmol/L (ref 135–145)

## 2016-09-18 LAB — CBC
HEMATOCRIT: 33.2 % — AB (ref 36.0–46.0)
Hemoglobin: 9.9 g/dL — ABNORMAL LOW (ref 12.0–15.0)
MCH: 25.9 pg — ABNORMAL LOW (ref 26.0–34.0)
MCHC: 29.8 g/dL — AB (ref 30.0–36.0)
MCV: 86.9 fL (ref 78.0–100.0)
Platelets: 268 10*3/uL (ref 150–400)
RBC: 3.82 MIL/uL — ABNORMAL LOW (ref 3.87–5.11)
RDW: 15.3 % (ref 11.5–15.5)
WBC: 4.8 10*3/uL (ref 4.0–10.5)

## 2016-09-18 SURGERY — LARYNGOSCOPY, DIRECT
Anesthesia: General | Site: Throat

## 2016-09-18 MED ORDER — OXYMETAZOLINE HCL 0.05 % NA SOLN
NASAL | Status: DC | PRN
  Administered 2016-09-18: 1 via TOPICAL

## 2016-09-18 MED ORDER — SENNA 8.6 MG PO TABS: 1.0000 | ORAL_TABLET | Freq: Every evening | ORAL | Status: DC | PRN

## 2016-09-18 MED ORDER — ONDANSETRON HCL 4 MG/2ML IJ SOLN: 4.0000 mg | INTRAMUSCULAR | Status: DC | PRN

## 2016-09-18 MED ORDER — KETAMINE HCL 100 MG/ML IJ SOLN
INTRAMUSCULAR | Status: AC
  Filled 2016-09-18: qty 1

## 2016-09-18 MED ORDER — ACETAMINOPHEN 325 MG PO TABS
325.0000 mg | ORAL_TABLET | Freq: Every day | ORAL | Status: DC | PRN
  Administered 2016-09-18: 325 mg via ORAL
  Filled 2016-09-18: qty 1

## 2016-09-18 MED ORDER — PHENYLEPHRINE HCL 10 MG/ML IJ SOLN
INTRAMUSCULAR | Status: DC | PRN
  Administered 2016-09-18: 80 ug via INTRAVENOUS

## 2016-09-18 MED ORDER — MEPERIDINE HCL 25 MG/ML IJ SOLN: 6.2500 mg | INTRAMUSCULAR | Status: DC | PRN

## 2016-09-18 MED ORDER — ROCURONIUM BROMIDE 10 MG/ML (PF) SYRINGE
PREFILLED_SYRINGE | INTRAVENOUS | Status: AC
  Filled 2016-09-18: qty 10

## 2016-09-18 MED ORDER — SUGAMMADEX SODIUM 500 MG/5ML IV SOLN
INTRAVENOUS | Status: DC | PRN
  Administered 2016-09-18: 300 mg via INTRAVENOUS
  Administered 2016-09-18: 100 mg via INTRAVENOUS

## 2016-09-18 MED ORDER — 0.9 % SODIUM CHLORIDE (POUR BTL) OPTIME
TOPICAL | Status: DC | PRN
  Administered 2016-09-18: 1000 mL

## 2016-09-18 MED ORDER — LIDOCAINE 2% (20 MG/ML) 5 ML SYRINGE
INTRAMUSCULAR | Status: DC | PRN
  Administered 2016-09-18: 100 mg via INTRAVENOUS

## 2016-09-18 MED ORDER — ALPRAZOLAM 0.5 MG PO TABS: 0.5000 mg | ORAL_TABLET | Freq: Every evening | ORAL | Status: DC | PRN

## 2016-09-18 MED ORDER — EPINEPHRINE HCL (NASAL) 0.1 % NA SOLN
NASAL | Status: AC
  Filled 2016-09-18: qty 30

## 2016-09-18 MED ORDER — FENTANYL CITRATE (PF) 100 MCG/2ML IJ SOLN
INTRAMUSCULAR | Status: AC
  Filled 2016-09-18: qty 2

## 2016-09-18 MED ORDER — PROPOFOL 10 MG/ML IV BOLUS
INTRAVENOUS | Status: DC | PRN
  Administered 2016-09-18: 20 mg via INTRAVENOUS
  Administered 2016-09-18: 50 mg via INTRAVENOUS
  Administered 2016-09-18: 20 mg via INTRAVENOUS

## 2016-09-18 MED ORDER — FUROSEMIDE 40 MG PO TABS
40.0000 mg | ORAL_TABLET | Freq: Every day | ORAL | Status: DC
  Administered 2016-09-18 – 2016-09-19 (×2): 40 mg via ORAL
  Filled 2016-09-18 (×2): qty 1

## 2016-09-18 MED ORDER — ONDANSETRON HCL 4 MG/2ML IJ SOLN
INTRAMUSCULAR | Status: DC | PRN
  Administered 2016-09-18: 4 mg via INTRAVENOUS

## 2016-09-18 MED ORDER — HYDROCODONE-ACETAMINOPHEN 7.5-325 MG/15ML PO SOLN: 5.0000 mL | ORAL | Status: DC | PRN

## 2016-09-18 MED ORDER — OXYMETAZOLINE HCL 0.05 % NA SOLN
NASAL | Status: AC
  Filled 2016-09-18: qty 15

## 2016-09-18 MED ORDER — DEXAMETHASONE SODIUM PHOSPHATE 10 MG/ML IJ SOLN
INTRAMUSCULAR | Status: AC
  Filled 2016-09-18: qty 1

## 2016-09-18 MED ORDER — MIDAZOLAM HCL 5 MG/5ML IJ SOLN
INTRAMUSCULAR | Status: DC | PRN
  Administered 2016-09-18: 2 mg via INTRAVENOUS

## 2016-09-18 MED ORDER — PHENYLEPHRINE HCL 10 MG/ML IJ SOLN
INTRAMUSCULAR | Status: DC | PRN
  Administered 2016-09-18: 25 ug/min via INTRAVENOUS

## 2016-09-18 MED ORDER — IBUPROFEN 100 MG/5ML PO SUSP
400.0000 mg | Freq: Four times a day (QID) | ORAL | Status: DC | PRN
  Filled 2016-09-18: qty 20

## 2016-09-18 MED ORDER — POLYETHYLENE GLYCOL 3350 17 G PO PACK
17.0000 g | PACK | Freq: Every day | ORAL | Status: DC
  Administered 2016-09-18 – 2016-09-19 (×2): 17 g via ORAL
  Filled 2016-09-18 (×2): qty 1

## 2016-09-18 MED ORDER — PROPOFOL 10 MG/ML IV BOLUS
INTRAVENOUS | Status: AC
  Filled 2016-09-18: qty 20

## 2016-09-18 MED ORDER — ASPIRIN EC 81 MG PO TBEC
81.0000 mg | DELAYED_RELEASE_TABLET | Freq: Every day | ORAL | Status: DC
  Administered 2016-09-18 – 2016-09-19 (×2): 81 mg via ORAL
  Filled 2016-09-18 (×2): qty 1

## 2016-09-18 MED ORDER — ALBUTEROL SULFATE (2.5 MG/3ML) 0.083% IN NEBU
2.5000 mg | INHALATION_SOLUTION | Freq: Four times a day (QID) | RESPIRATORY_TRACT | Status: DC | PRN
Start: 2016-09-18 — End: 2016-09-19
  Administered 2016-09-19: 2.5 mg via RESPIRATORY_TRACT
  Filled 2016-09-18: qty 3

## 2016-09-18 MED ORDER — EPHEDRINE 5 MG/ML INJ
INTRAVENOUS | Status: AC
  Filled 2016-09-18: qty 20

## 2016-09-18 MED ORDER — LIDOCAINE 2% (20 MG/ML) 5 ML SYRINGE
INTRAMUSCULAR | Status: AC
  Filled 2016-09-18: qty 5

## 2016-09-18 MED ORDER — ROCURONIUM BROMIDE 10 MG/ML (PF) SYRINGE
PREFILLED_SYRINGE | INTRAVENOUS | Status: DC | PRN
  Administered 2016-09-18: 50 mg via INTRAVENOUS

## 2016-09-18 MED ORDER — TRIAMCINOLONE ACETONIDE 40 MG/ML IJ SUSP
INTRAMUSCULAR | Status: AC
  Filled 2016-09-18: qty 5

## 2016-09-18 MED ORDER — LACTATED RINGERS IV SOLN
INTRAVENOUS | Status: DC | PRN
  Administered 2016-09-18: 07:00:00 via INTRAVENOUS

## 2016-09-18 MED ORDER — KETAMINE HCL 10 MG/ML IJ SOLN
INTRAMUSCULAR | Status: DC | PRN
  Administered 2016-09-18: 20 mg via INTRAVENOUS
  Administered 2016-09-18: 70 mg via INTRAVENOUS

## 2016-09-18 MED ORDER — HYDROMORPHONE HCL 1 MG/ML IJ SOLN: 0.2500 mg | INTRAMUSCULAR | Status: DC | PRN

## 2016-09-18 MED ORDER — PROMETHAZINE HCL 25 MG/ML IJ SOLN: 6.2500 mg | INTRAMUSCULAR | Status: DC | PRN

## 2016-09-18 MED ORDER — DEXAMETHASONE SODIUM PHOSPHATE 10 MG/ML IJ SOLN
INTRAMUSCULAR | Status: DC | PRN
  Administered 2016-09-18: 10 mg via INTRAVENOUS

## 2016-09-18 MED ORDER — PANTOPRAZOLE SODIUM 40 MG PO TBEC
40.0000 mg | DELAYED_RELEASE_TABLET | Freq: Every day | ORAL | Status: DC
  Administered 2016-09-19: 40 mg via ORAL
  Filled 2016-09-18: qty 1

## 2016-09-18 MED ORDER — BISACODYL 10 MG RE SUPP: 10.0000 mg | Freq: Every day | RECTAL | Status: DC | PRN

## 2016-09-18 MED ORDER — MIDAZOLAM HCL 2 MG/2ML IJ SOLN
INTRAMUSCULAR | Status: AC
  Filled 2016-09-18: qty 2

## 2016-09-18 MED ORDER — NITROGLYCERIN 0.4 MG SL SUBL: 0.4000 mg | SUBLINGUAL_TABLET | SUBLINGUAL | Status: DC | PRN

## 2016-09-18 MED ORDER — ONDANSETRON HCL 4 MG PO TABS: 4.0000 mg | ORAL_TABLET | ORAL | Status: DC | PRN

## 2016-09-18 MED ORDER — DEXTROSE-NACL 5-0.45 % IV SOLN
INTRAVENOUS | Status: DC
  Administered 2016-09-18 – 2016-09-19 (×2): via INTRAVENOUS

## 2016-09-18 SURGICAL SUPPLY — 31 items
BALLN PULM 15 16.5 18 X 75CM (BALLOONS)
BALLN PULM 15 16.5 18X75 (BALLOONS)
BALLOON PULM 15 16.5 18X75 (BALLOONS) IMPLANT
BLADE SURG 15 STRL LF DISP TIS (BLADE) IMPLANT
BLADE SURG 15 STRL SS (BLADE)
CONT SPEC 4OZ CLIKSEAL STRL BL (MISCELLANEOUS) IMPLANT
COVER MAYO STAND STRL (DRAPES) ×3 IMPLANT
COVER TABLE BACK 60X90 (DRAPES) ×3 IMPLANT
CRADLE DONUT ADULT HEAD (MISCELLANEOUS) IMPLANT
DRAPE PROXIMA HALF (DRAPES) ×3 IMPLANT
GLOVE BIOGEL PI IND STRL 7.0 (GLOVE) ×1 IMPLANT
GLOVE BIOGEL PI INDICATOR 7.0 (GLOVE) ×2
GLOVE ECLIPSE 8.0 STRL XLNG CF (GLOVE) ×3 IMPLANT
GLOVE SURG SS PI 7.0 STRL IVOR (GLOVE) ×3 IMPLANT
GOWN STRL REUS W/ TWL LRG LVL3 (GOWN DISPOSABLE) ×1 IMPLANT
GOWN STRL REUS W/ TWL XL LVL3 (GOWN DISPOSABLE) ×1 IMPLANT
GOWN STRL REUS W/TWL LRG LVL3 (GOWN DISPOSABLE) ×2
GOWN STRL REUS W/TWL XL LVL3 (GOWN DISPOSABLE) ×2
GUARD TEETH (MISCELLANEOUS) ×3 IMPLANT
KIT BASIN OR (CUSTOM PROCEDURE TRAY) ×3 IMPLANT
KIT ROOM TURNOVER OR (KITS) IMPLANT
NEEDLE HYPO 25GX1X1/2 BEV (NEEDLE) IMPLANT
NS IRRIG 1000ML POUR BTL (IV SOLUTION) ×3 IMPLANT
PAD ARMBOARD 7.5X6 YLW CONV (MISCELLANEOUS) IMPLANT
PATTIES SURGICAL .5 X3 (DISPOSABLE) ×3 IMPLANT
SOLUTION ANTI FOG 6CC (MISCELLANEOUS) IMPLANT
SURGILUBE 2OZ TUBE FLIPTOP (MISCELLANEOUS) IMPLANT
SYR INFLATE BILIARY GAUGE (MISCELLANEOUS) IMPLANT
TOWEL OR 17X24 6PK STRL BLUE (TOWEL DISPOSABLE) ×3 IMPLANT
TUBE CONNECTING 12'X1/4 (SUCTIONS) ×1
TUBE CONNECTING 12X1/4 (SUCTIONS) ×2 IMPLANT

## 2016-09-18 NOTE — Progress Notes (Signed)
Windle Guard called states he will be here around 0730

## 2016-09-18 NOTE — Op Note (Signed)
09/18/2016  8:41 AM    Rudean Curt  [id number]   Pre-Op Dx:  Intubation granuloma, posterior commissure  Post-op Dx: Same  Proc: Direct laryngoscopy, bronchoscopy, removal intubation granuloma   Surg:  [name] T MD  Anes:  General mask  EBL:  None  Comp:  None  Findings:  Granulation tissue in the posterior subglottis bridging from side to side.  Proc:  Direct Laryngoscopy with Biopsy,   Bronchoscopy  Procedure: With the patient in a comfortable supine position, General Mask anesthesia was induced without difficulty.  At an appropriate level, the table was turned 90 degrees away from Anesthesia.  A clean preparation and draping was performed in the standard fashion.  A surgical time out was obtained in the standard fashion.  A rubber tooth guard was placed.     Using the [facility] laryngoscope, the larynx was visualized.  The rod bronchoscope was passed into the upper trachea. Findings were as described above. A photograph was taken. The bronchoscope and laryngoscope were removed.  The anterior commissure laryngoscope was introduced taking care to protect lips, and teeth, and placed in the supraglottis.  An Afrin pledget was applied to the area of concern for topical vasoconstriction. Several minutes were allowed for this to take effect.     The pledget was removed. Under direct vision using an up cup in a straight cup forceps, the central portion of the granulation was removed and sent for pathologic interpretation. Small areas on each side were trimmed and also sent for pathology.  An Afrin pledget was applied again for hemostasis for several minutes.  After removal hemostasis was observed. Another photograph was taken. At this point the procedure was completed. The laryngoscope was removed. The dental status was intact.  Mask ventilation was used at intervals throughout the procedure guided by anesthesia and oxygen saturation.   The patient was returned  to Anesthesia, awakened, and transferred to PACU in satisfactory condition.   Dispo:   PACU to 23 hour observation  Plan:  Continue antireflux management.  Tyson Alias MD

## 2016-09-18 NOTE — Discharge Instructions (Signed)
Diet as comfortable Activity as comfortable Continue your anti reflux medications (Omeprazole) Recheck my office 2 weeks please [phone]

## 2016-09-18 NOTE — Progress Notes (Signed)
Sandy from [employer].

## 2016-09-18 NOTE — Progress Notes (Signed)
A line d/c,d with pressure applied x 5 minutes and pressure bandage applied

## 2016-09-18 NOTE — Progress Notes (Signed)
Report given to robin roberts rn as caregiver 

## 2016-09-18 NOTE — Patient Outreach (Signed)
   Unsuccessful attempt made to contact patient via telephone. Received ADT [place] advising this case manager patient is in the acute care settign.    Plan: Attempt to engage patient for transition of care when patient is discharged from the acute care setting.

## 2016-09-18 NOTE — Anesthesia Postprocedure Evaluation (Signed)
Anesthesia Post Note  Patient: [name]  Procedure(s) Performed: Procedure(s) (LRB): DIRECT LARYNGOSCOPY, BRONCHOSCOPY, REMOVAL OF INTUBATION GRANULOMA (N/A)  Patient location during evaluation: PACU Anesthesia Type: General Level of consciousness: awake and alert Pain management: pain level controlled Vital Signs Assessment: post-procedure vital signs reviewed and stable Respiratory status: spontaneous breathing, nonlabored ventilation and respiratory function stable Cardiovascular status: blood pressure returned to baseline and stable Postop Assessment: no signs of nausea or vomiting Anesthetic complications: no    Last Vitals:  Vitals:   09/18/16 1531 09/18/16 1812  BP: 133/69 (!) 141/84  Pulse: 70 75  Resp: 20 19  Temp: 36.9 C 36.5 C    Last Pain:  Vitals:   09/18/16 1904  TempSrc:   PainSc: 0-No pain                 Torie Priebe A

## 2016-09-18 NOTE — OR Nursing (Signed)
Dr. Senaida Ores rounding on patient questioned need for interrogation of pacemaker. Have comfirmed with [facility] CRNA that no changes were made to PM and [employer] rep Aaron Edelman also confirms that no changes were made and PM is satisfactory.

## 2016-09-18 NOTE — Transfer of Care (Signed)
Immediate Anesthesia Transfer of Care Note  Patient: [name]  Procedure(s) Performed: Procedure(s): DIRECT LARYNGOSCOPY, BRONCHOSCOPY, REMOVAL OF INTUBATION GRANULOMA (N/A)  Patient Location: PACU  Anesthesia Type:General  Level of Consciousness: awake, alert  and oriented  Airway & Oxygen Therapy: Patient Spontanous Breathing and Patient connected to face mask oxygen  Post-op Assessment: Report given to RN, Post -op Vital signs reviewed and stable and Patient moving all extremities  Post vital signs: Reviewed and stable  Last Vitals:  Vitals:   09/18/16 0604 09/18/16 0850  BP: (!) 149/80   Pulse: 70   Resp: 18   Temp: 36.9 C 36.5 C    Last Pain: There were no vitals filed for this visit.    Patients Stated Pain Goal: 2 (43/60/67 7034)  Complications: No apparent anesthesia complications

## 2016-09-18 NOTE — Anesthesia Procedure Notes (Signed)
Date/Time: 09/18/2016 8:00 AM Performed by: Kyung Rudd Pre-anesthesia Checklist: Patient identified, Emergency Drugs available, Suction available and Patient being monitored Oxygen Delivery Method: Circle system utilized Preoxygenation: Pre-oxygenation with 100% oxygen Intubation Type: IV induction Ventilation: Mask ventilation without difficulty and Mask ventilation throughout procedure Placement Confirmation: positive ETCO2 and breath sounds checked- equal and bilateral Dental Injury: Teeth and Oropharynx as per pre-operative assessment

## 2016-09-18 NOTE — H&P (Signed)
Mystery, Schrupp 70 y.o., female [id number]     Chief Complaint: stridor  HPI: 70 yo bf, hospitalized for complications from cardiac procedure in JUL.  Eventually extubated under anesthesia and did well.  Hospitalized for CHF in early SEP.  cardiad cath showed good coronary arteries with significant LV dysfunction.  Now with progressive stridor and dyspnea. Probable intubation granuloma posterior subglottic larynx.  Cords mobile.  PMH: Past Medical History:  Diagnosis Date  . Anemia   . Angina   . Angioedema    2/2 ACE  . Arteriovenous malformation of stomach   . Arthritis   . Asthma   . AVM (arteriovenous malformation) of colon    small intestine; stomach  . Blood transfusion   . CHF (congestive heart failure) ([place])   . Complete heart block ([place])    s/p PPM 1998  . Complication of anesthesia    Difficult airway; anaphylaxis and swelling with propofol  . DDD (degenerative disc disease)   . Depression   . Diastolic heart failure   . Fatty liver 07/26/10  . GERD (gastroesophageal reflux disease)   . GI bleed   . Headache   . History of alcohol abuse Stopped Fall 2012  . History of tobacco use Quit Fall 2012  . Hx of cardiovascular stress test    a. Lexiscan Myoview (10/15):  Small inferolateral and apical defect c/w scar and poss soft tissue attenuation, no ischemia, EF 42%  . Hx of colonic polyp 08/13/10   adenomatous  . Hx of colonoscopy   . Hyperlipidemia   . Hyperlipidemia   . Hypertension   . Hypertrophic cardiomyopathy ([place])    dx by Dr [name] 2009  . Iron deficiency anemia   . Panic attack   . Panic attacks   . Permanent atrial fibrillation ([place])   . Pneumonia   . Renal failure    baseline creatinine 1.6  . Right arm pain 01/08/2012  . RLS (restless legs syndrome)    Dx 06/2007  . Shortness of breath    sob on exertation  . Sleep apnea     Surg Hx: Past Surgical History:  Procedure Laterality Date  . CARDIAC CATHETERIZATION    . CARDIAC  CATHETERIZATION N/A 08/19/2016   Procedure: Right/Left Heart Cath and Coronary Angiography;  Surgeon: Jolaine Artist, MD;  Location: [facility];  Service: Cardiovascular;  Laterality: N/A;  . ESOPHAGOGASTRODUODENOSCOPY  12/23/2011   Procedure: ESOPHAGOGASTRODUODENOSCOPY (EGD);  Surgeon: [name], MD;  Location: [facility];  Service: Endoscopy;  Laterality: N/A;  . EXTUBATION (ENDOTRACHEAL) IN OR N/A 07/21/2016   Procedure: EXTUBATION (ENDOTRACHEAL) IN OR;  Surgeon: [name], MD;  Location: [facility];  Service: ENT;  Laterality: N/A;  . GIVENS CAPSULE STUDY  12/23/2011   Procedure: GIVENS CAPSULE STUDY;  Surgeon: [name], MD;  Location: [facility];  Service: Endoscopy;  Laterality: N/A;  . PACEMAKER INSERTION  1998   st jude, most recent gen change by [name] 4/12  . TUBAL LIGATION  04/01/2000    FHx:   Family History  Problem Relation Age of Onset  . Hypertension Mother   . Stroke Mother   . Heart disease Mother   . Aneurysm Mother   . CVA Mother   . Hypertension Father   . Stroke Father   . Heart attack Father   . Alcohol abuse Father   . Hypertension Sister   . Hypertension Brother   . Colon cancer Brother   . Diabetes  Sister   . Diabetes Brother   . Cancer Brother   . Heart attack Brother   . Heart disease Sister   . Alcohol abuse Brother    SocHx:  reports that she quit smoking about 5 years ago. Her smoking use included Cigarettes. She has a 5.00 pack-year smoking history. She has never used smokeless tobacco. She reports that she drinks alcohol. She reports that she does not use drugs.  ALLERGIES:  Allergies  Allergen Reactions  . Ace Inhibitors Other (See Comments)    Angioedema  . Propofol Anaphylaxis and Swelling    Medications Prior to Admission  Medication Sig Dispense Refill  . acetaminophen (TYLENOL) 325 MG tablet Take 325 mg by mouth daily as needed (pain).    [name] albuterol (PROVENTIL) (2.5 MG/3ML) 0.083% nebulizer solution Take 3 mLs (2.5 mg  total) by nebulization every 6 (six) hours as needed for wheezing or shortness of breath. 150 mL 1  . ALPRAZolam (XANAX) 0.5 MG tablet Take 1 tablet (0.5 mg total) by mouth at bedtime as needed for anxiety. 15 tablet 0  . aspirin EC 81 MG tablet Take 81 mg by mouth every morning.     . bisacodyl (DULCOLAX) 10 MG suppository Place 10 mg rectally daily as needed for mild constipation.     [name] CALCIUM PO Take 1 tablet by mouth once a week.     . ferrous sulfate 325 (65 FE) MG tablet TAKE 1 TABLET BY MOUTH TWICE DAILY WITH MEALS TO KEEP BLOOD COUNT UP (Patient taking differently: Take 325 mg by mouth 2 (two) times daily with a meal. TO KEEP BLOOD COUNT UP) 60 tablet 11  . furosemide (LASIX) 40 MG tablet Take 1 tablet (40 mg total) by mouth daily. 90 tablet 3  . nitroGLYCERIN (NITROSTAT) 0.4 MG SL tablet Place 1 tablet (0.4 mg total) under the tongue every 5 (five) minutes as needed for chest pain. 25 tablet 1  . omeprazole (PRILOSEC) 20 MG capsule Take 1 capsule (20 mg total) by mouth daily. 30 capsule 6  . polyethylene glycol (MIRALAX / GLYCOLAX) packet Take 17 g by mouth daily. 14 each 0  . rosuvastatin (CRESTOR) 20 MG tablet Take 1 tablet (20 mg total) by mouth daily. 90 tablet 3  . senna (SENOKOT) 8.6 MG tablet Take 1 tablet by mouth at bedtime as needed for constipation.     . trolamine salicylate (ASPERCREME) 10 % cream Apply 1 application topically 2 (two) times daily as needed for muscle pain.       No results found for this or any previous visit (from the past 48 hour(s)). No results found.  ROS: no chest pain  No productive cough.  Blood pressure (!) 149/80, pulse 70, temperature 98.4 F (36.9 C), resp. rate 18, weight 75.8 kg (167 lb), SpO2 97 %.  PHYSICAL EXAM: Overall appearance: short, overweight, hoarse, inspiratory stridor Head: NCAT Ears: clear Nose: clear Oral Cavity: good teeth Oral Pharynx/Hypopharynx/Larynx:  Per flexible scope, mobile VC's, posterior subglottic mass.   Airway fair Neuro:grossly nl Neck:  No masses or nodes. Lungs:  Distant but clear Heart:  RRR, no murmurs Abd: soft, active Ext: grossly nl  Studies Reviewed:  Hospital records SEP '17    Assessment/Plan Probable intubation granuloma with progressive hoarseness, stridor, and dyspnea  Plan:  Exam under anesthesia, removal mass  [name] 27/0/3500, 7:40 AM

## 2016-09-19 ENCOUNTER — Encounter (HOSPITAL_COMMUNITY): Payer: Self-pay | Admitting: Otolaryngology

## 2016-09-19 DIAGNOSIS — I272 Pulmonary hypertension, unspecified: Secondary | ICD-10-CM | POA: Diagnosis not present

## 2016-09-19 DIAGNOSIS — E785 Hyperlipidemia, unspecified: Secondary | ICD-10-CM | POA: Diagnosis not present

## 2016-09-19 DIAGNOSIS — J387 Other diseases of larynx: Secondary | ICD-10-CM | POA: Diagnosis not present

## 2016-09-19 DIAGNOSIS — I482 Chronic atrial fibrillation: Secondary | ICD-10-CM | POA: Diagnosis not present

## 2016-09-19 DIAGNOSIS — D509 Iron deficiency anemia, unspecified: Secondary | ICD-10-CM | POA: Diagnosis not present

## 2016-09-19 DIAGNOSIS — M1990 Unspecified osteoarthritis, unspecified site: Secondary | ICD-10-CM | POA: Diagnosis not present

## 2016-09-19 DIAGNOSIS — Z79899 Other long term (current) drug therapy: Secondary | ICD-10-CM | POA: Diagnosis not present

## 2016-09-19 DIAGNOSIS — J449 Chronic obstructive pulmonary disease, unspecified: Secondary | ICD-10-CM | POA: Diagnosis not present

## 2016-09-19 DIAGNOSIS — I5030 Unspecified diastolic (congestive) heart failure: Secondary | ICD-10-CM | POA: Diagnosis not present

## 2016-09-19 DIAGNOSIS — K219 Gastro-esophageal reflux disease without esophagitis: Secondary | ICD-10-CM | POA: Diagnosis not present

## 2016-09-19 DIAGNOSIS — Z87891 Personal history of nicotine dependence: Secondary | ICD-10-CM | POA: Diagnosis not present

## 2016-09-19 DIAGNOSIS — Z7982 Long term (current) use of aspirin: Secondary | ICD-10-CM | POA: Diagnosis not present

## 2016-09-19 DIAGNOSIS — Z8249 Family history of ischemic heart disease and other diseases of the circulatory system: Secondary | ICD-10-CM | POA: Diagnosis not present

## 2016-09-19 DIAGNOSIS — Z950 Presence of cardiac pacemaker: Secondary | ICD-10-CM | POA: Diagnosis not present

## 2016-09-19 DIAGNOSIS — I130 Hypertensive heart and chronic kidney disease with heart failure and stage 1 through stage 4 chronic kidney disease, or unspecified chronic kidney disease: Secondary | ICD-10-CM | POA: Diagnosis not present

## 2016-09-19 DIAGNOSIS — J9601 Acute respiratory failure with hypoxia: Secondary | ICD-10-CM | POA: Diagnosis not present

## 2016-09-19 DIAGNOSIS — N189 Chronic kidney disease, unspecified: Secondary | ICD-10-CM | POA: Diagnosis not present

## 2016-09-19 DIAGNOSIS — G4730 Sleep apnea, unspecified: Secondary | ICD-10-CM | POA: Diagnosis not present

## 2016-09-19 NOTE — Discharge Summary (Signed)
09/19/2016 9:21 AM  [name], [name] [id number]  Post-Op Day 1    Temp:  [97.2 F (36.2 C)-98.5 F (36.9 C)] 98 F (36.7 C) (10/06 0607) Pulse Rate:  [69-75] 75 (10/06 0607) Resp:  [14-23] 20 (10/06 0607) BP: (118-166)/(62-91) 130/76 (10/06 0607) SpO2:  [82 %-100 %] 98 % (10/06 0607),     Intake/Output Summary (Last 24 hours) at 09/19/16 0921 Last data filed at 09/19/16 0607  Gross per 24 hour  Intake              840 ml  Output             1650 ml  Net             -810 ml    No results found for this or any previous visit (from the past 24 hour(s)).  SUBJECTIVE:  Some pain with swallowing.  Still some stridor and dyspnea.  Voice better.  OBJECTIVE:  Less stridor.  Voice stronger.    IMPRESSION:  Satisfactory check  PLAN:  Discharge home  Admit:  5 OCT Discharge:  6 OCT Final Diagnosis:  Laryngeal stenosis Proc: direct laryngoscopy.  Bronchoscopy.  Removal subglottic granulation tissue Comp:  None Cond: ambulatory.  Still some stridor.  Voice improved.  Pain minimal Recheck: 2 weeks my office Rx:  None Instructions written and given  Hosp Course:  Pt underwent surgery for laryngeal stenosis/intubation granuloma on day of admission.  Observed 23 hr observation without difficulty.  Some residual stridor and pain with swallowing.  POD 1 discharged to home and care of family.    [name]

## 2016-09-19 NOTE — Progress Notes (Signed)
Patient discharged to home with instructions. 

## 2016-09-20 DIAGNOSIS — I509 Heart failure, unspecified: Secondary | ICD-10-CM | POA: Diagnosis not present

## 2016-09-20 DIAGNOSIS — J449 Chronic obstructive pulmonary disease, unspecified: Secondary | ICD-10-CM | POA: Diagnosis not present

## 2016-09-22 ENCOUNTER — Other Ambulatory Visit: Payer: Self-pay

## 2016-09-22 NOTE — Patient Outreach (Signed)
[facility]) Care Management  09/22/2016   [name] 1946/06/07 [id number]  Subjective:  I had to go to the hospital because I wanted the doctor to see what was in my throat, it felt funny!  Objective:  Telephone contact Current Medications:  Current Outpatient Prescriptions  Medication Sig Dispense Refill  . acetaminophen (TYLENOL) 325 MG tablet Take 325 mg by mouth daily as needed (pain).    [name] albuterol (PROVENTIL) (2.5 MG/3ML) 0.083% nebulizer solution Take 3 mLs (2.5 mg total) by nebulization every 6 (six) hours as needed for wheezing or shortness of breath. 150 mL 1  . ALPRAZolam (XANAX) 0.5 MG tablet Take 1 tablet (0.5 mg total) by mouth at bedtime as needed for anxiety. 15 tablet 0  . aspirin EC 81 MG tablet Take 81 mg by mouth every morning.     . bisacodyl (DULCOLAX) 10 MG suppository Place 10 mg rectally daily as needed for mild constipation.     [name] CALCIUM PO Take 1 tablet by mouth once a week.     . ferrous sulfate 325 (65 FE) MG tablet TAKE 1 TABLET BY MOUTH TWICE DAILY WITH MEALS TO KEEP BLOOD COUNT UP (Patient taking differently: Take 325 mg by mouth 2 (two) times daily with a meal. TO KEEP BLOOD COUNT UP) 60 tablet 11  . furosemide (LASIX) 40 MG tablet Take 1 tablet (40 mg total) by mouth daily. 90 tablet 3  . nitroGLYCERIN (NITROSTAT) 0.4 MG SL tablet Place 1 tablet (0.4 mg total) under the tongue every 5 (five) minutes as needed for chest pain. 25 tablet 1  . omeprazole (PRILOSEC) 20 MG capsule Take 1 capsule (20 mg total) by mouth daily. 30 capsule 6  . polyethylene glycol (MIRALAX / GLYCOLAX) packet Take 17 g by mouth daily. 14 each 0  . rosuvastatin (CRESTOR) 20 MG tablet Take 1 tablet (20 mg total) by mouth daily. 90 tablet 3  . senna (SENOKOT) 8.6 MG tablet Take 1 tablet by mouth at bedtime as needed for constipation.     . trolamine salicylate (ASPERCREME) 10 % cream Apply 1 application topically 2 (two) times daily as needed for muscle  pain.      No current facility-administered medications for this visit.     Functional Status:  In your present state of health, do you have any difficulty performing the following activities: 09/22/2016 09/18/2016  Hearing? N N  Vision? Y N  Difficulty concentrating or making decisions? N N  Walking or climbing stairs? Y Y  Dressing or bathing? N Y  Doing errands, shopping? Y -  Conservation officer, nature and eating ? Y -  Using the Toilet? N -  In the past six months, have you accidently leaked urine? N -  Do you have problems with loss of bowel control? N -  Managing your Medications? N -  Managing your Finances? N -  Housekeeping or managing your Housekeeping? Y -  Some recent data might be hidden    Fall/Depression Screening: PHQ 2/9 Scores 09/22/2016 08/27/2016 08/26/2016 02/07/2016 12/31/2015 10/05/2015 08/29/2015  PHQ - 2 Score 0 0 0 0 0 0 1   Fall Risk  09/22/2016 08/27/2016 08/26/2016 12/31/2015 10/05/2015  Falls in the past year? No No No No No  Risk for fall due to : Impaired balance/gait;Impaired mobility;Impaired vision;Medication side effect Impaired mobility;Impaired vision;Medication side effect - - -   [facility] CM Care Plan Problem One   Flowsheet Row Most Recent Value  Care Plan Problem One  knowledge deficit related to heart failure managemewnt  Role Documenting the Problem One  Care Management Coordinator  Care Plan for Problem One  Active  THN Long Term Goal (31-90 days)  In the next 31 days, patient will be able to verbalize the 3 zones of the Heart Failure Action Plan  [facility]N Long Term Goal Start Date  09/22/16  Interventions for Problem One Long Term Goal  transition of care call after patient discharge with diagnosis of  laryngeal obstructions  [facility] CM Short Term Goal #1 (0-30 days)  in the next 28 days, patient will meet with [facility] for COPD Health education  [facility] CM Short Term Goal #1 Start Date  09/22/16  Interventions for Short Term Goal #1  Transition of care call made    [facility] CM  Care Plan Problem Two   Flowsheet Row Most Recent Value  Care Plan Problem Two  knowledge deficit related to COPD  Role Documenting the Problem Two  Care Management Coordinator  Care Plan for Problem Two  Active  [facility] CM Short Term Goal #1 (0-30 days)  In the next 21 days, patient will meet with [place] for COPD Education  [facility] CM Short Term Goal #1 Start Date  09/22/16  Interventions for Short Term Goal #2   Transition of care call made, discussed COPD Zones of ACTION Plan     Assessment:  Patient had recent hospitalization for laryngeal obstruction. Patient reports knowledge deficit related to COPD  Plan:  Home visit later this month for COPD Education

## 2016-09-24 ENCOUNTER — Encounter: Payer: Self-pay | Admitting: Cardiovascular Disease

## 2016-09-24 ENCOUNTER — Ambulatory Visit (INDEPENDENT_AMBULATORY_CARE_PROVIDER_SITE_OTHER): Payer: Medicare Other | Admitting: Cardiovascular Disease

## 2016-09-24 VITALS — BP 100/66 | HR 70 | Ht 65.0 in | Wt 161.0 lb

## 2016-09-24 DIAGNOSIS — I442 Atrioventricular block, complete: Secondary | ICD-10-CM

## 2016-09-24 DIAGNOSIS — I422 Other hypertrophic cardiomyopathy: Secondary | ICD-10-CM

## 2016-09-24 DIAGNOSIS — E7800 Pure hypercholesterolemia, unspecified: Secondary | ICD-10-CM | POA: Diagnosis not present

## 2016-09-24 DIAGNOSIS — I482 Chronic atrial fibrillation: Secondary | ICD-10-CM

## 2016-09-24 DIAGNOSIS — I110 Hypertensive heart disease with heart failure: Secondary | ICD-10-CM

## 2016-09-24 DIAGNOSIS — Q2730 Arteriovenous malformation, site unspecified: Secondary | ICD-10-CM

## 2016-09-24 DIAGNOSIS — I4821 Permanent atrial fibrillation: Secondary | ICD-10-CM

## 2016-09-24 NOTE — Patient Instructions (Addendum)
Medication Instructions:  CHANGE FUROSEMIDE TO 20 MG DAILY AS NEEDED FOR WEIGHT GAIN OF 2 POUNDS IN 24 HOURS OR 5 POUNDS IN 7 DAYS  Labwork: NONE  Testing/Procedures: NONE  Follow-Up: Your physician recommends that you schedule a follow-up appointment in: 4-5 WEEKS   If you need a refill on your cardiac medications before your next appointment, please call your pharmacy.

## 2016-09-24 NOTE — Progress Notes (Signed)
Cardiology Office Note   Date:  09/24/2016   ID:  [name], DOB Aug 17, 1946, MRN [id number]  PCP:  Steve Rattler, DO  Cardiologist:   Skeet Latch, MD  Electrophysiologist: Thompson Grayer, MD   Chief Complaint  Patient presents with  . Follow-up  . Shortness of Breath     pt states she doesn't have to be doing anything to get SOB     Patient ID: [name] is a 70 y.o. female with hypertension, hyperlipidemia, CKD III, chronic atrial fibrillation, (not anticoagulated due to GI bleed), chronic diastolic heart failure, complete heart block status post pacemaker (St. Jude), hypertrophic cardiomyopathy, and COPD who presents for follow up.    History of Present Illness: [name] was first seen on 12/14/15 as a referral from Dr. Rayann Heman. She was referred to general cardiology to establish care. She had been admitted to the hospital in early December with a complaint of chest pain and was noted to be mildly volume overloaded. Her BNP was 436 and echo revealed normal systolic function with grade 2 diastolic dysfunction. She was diuresed with IV Lasix and developed acute renal failure. Troponin was mildly elevated to 0.19 Lexiscan Cardiolite was negative for ischemia.  [name] continued to struggle with palpitations and dizziness.  She has been in chronic atrial fibrillation and has not been on anticoagulation due to a GI bleed in 2012. She has known AVMs. She was referred for consideration of a Watchman device 0/9470, which was complicated by acute respiratory distress during the TEE that required intubation.  She had an acute drip in biventricular failure that eventually normalized.  She was discharged to a SNF and has been admitted on additional time with volume overload.  She has struggled with laryngeal stenosis and has follow up scheduled with an ENT in [facility]  She notes that her cough is weak and she has a hard time expectorating phlegm.     [name] has struggled with lightheadedness that is worse in the mornings.  She hasn't experienced syncope.  She denies chest pain and her breathing has been stable.  She lost 10 lb during her last hospitalization and notes that her appetite has been poor.  She continues to have palpitations that are worse when she eats or drinks.  She denies lower extremity edema, orthopnea or PND.  She has been off carvedilol due to hypotension.  Past Medical History:  Diagnosis Date  . Anemia   . Angina   . Angioedema    2/2 ACE  . Arteriovenous malformation of stomach   . Arthritis   . Asthma   . AVM (arteriovenous malformation) of colon    small intestine; stomach  . Blood transfusion   . CHF (congestive heart failure) ([place])   . Complete heart block ([place])    s/p PPM 1998  . Complication of anesthesia    Difficult airway; anaphylaxis and swelling with propofol  . DDD (degenerative disc disease)   . Depression   . Diastolic heart failure   . Fatty liver 07/26/10  . GERD (gastroesophageal reflux disease)   . GI bleed   . Headache   . History of alcohol abuse Stopped Fall 2012  . History of tobacco use Quit Fall 2012  . Hx of cardiovascular stress test    a. Lexiscan Myoview (10/15):  Small inferolateral and apical defect c/w scar and poss soft tissue attenuation, no ischemia, EF 42%  . Hx of colonic polyp 08/13/10   adenomatous  .  Hx of colonoscopy   . Hyperlipidemia   . Hyperlipidemia   . Hypertension   . Hypertrophic cardiomyopathy ([place])    dx by Dr [name] 2009  . Iron deficiency anemia   . Panic attack   . Panic attacks   . Permanent atrial fibrillation ([place])   . Pneumonia   . Renal failure    baseline creatinine 1.6  . Right arm pain 01/08/2012  . RLS (restless legs syndrome)    Dx 06/2007  . Shortness of breath    sob on exertation  . Sleep apnea     Past Surgical History:  Procedure Laterality Date  . CARDIAC CATHETERIZATION    . CARDIAC CATHETERIZATION N/A  08/19/2016   Procedure: Right/Left Heart Cath and Coronary Angiography;  Surgeon: Jolaine Artist, MD;  Location: [place] [facility];  Service: Cardiovascular;  Laterality: N/A;  . DIRECT LARYNGOSCOPY N/A 09/18/2016   Procedure: DIRECT LARYNGOSCOPY, BRONCHOSCOPY, REMOVAL OF INTUBATION GRANULOMA;  Surgeon: [name], MD;  Location: [facility] OR;  Service: ENT;  Laterality: N/A;  . ESOPHAGOGASTRODUODENOSCOPY  12/23/2011   Procedure: ESOPHAGOGASTRODUODENOSCOPY (EGD);  Surgeon: [name], MD;  Location: [facility];  Service: Endoscopy;  Laterality: N/A;  . EXTUBATION (ENDOTRACHEAL) IN OR N/A 07/21/2016   Procedure: EXTUBATION (ENDOTRACHEAL) IN OR;  Surgeon: [name], MD;  Location: [place];  Service: ENT;  Laterality: N/A;  . GIVENS CAPSULE STUDY  12/23/2011   Procedure: GIVENS CAPSULE STUDY;  Surgeon: [name], MD;  Location: [facility];  Service: Endoscopy;  Laterality: N/A;  . PACEMAKER INSERTION  1998   st jude, most recent gen change by [name] 4/12  . TUBAL LIGATION  04/01/2000     Current Outpatient Prescriptions  Medication Sig Dispense Refill  . acetaminophen (TYLENOL) 325 MG tablet Take 325 mg by mouth daily as needed (pain).    [name] albuterol (PROVENTIL) (2.5 MG/3ML) 0.083% nebulizer solution Take 3 mLs (2.5 mg total) by nebulization every 6 (six) hours as needed for wheezing or shortness of breath. 150 mL 1  . ALPRAZolam (XANAX) 0.5 MG tablet Take 1 tablet (0.5 mg total) by mouth at bedtime as needed for anxiety. 15 tablet 0  . aspirin EC 81 MG tablet Take 81 mg by mouth every morning.     . bisacodyl (DULCOLAX) 10 MG suppository Place 10 mg rectally daily as needed for mild constipation.     [name] CALCIUM PO Take 1 tablet by mouth once a week.     . ferrous sulfate 325 (65 FE) MG tablet TAKE 1 TABLET BY MOUTH TWICE DAILY WITH MEALS TO KEEP BLOOD COUNT UP (Patient taking differently: Take 325 mg by mouth 2 (two) times daily with a meal. TO KEEP BLOOD COUNT UP) 60 tablet 11  . furosemide  (LASIX) 20 MG tablet Take 20 mg by mouth as directed. TAKE 1 TABLET BY MOUTH AS NEEDED FOR WEIGHT GAIN (2 LBS IN 24 HRS OR 5 LBS IN 1 WEEK)    . nitroGLYCERIN (NITROSTAT) 0.4 MG SL tablet Place 1 tablet (0.4 mg total) under the tongue every 5 (five) minutes as needed for chest pain. 25 tablet 1  . omeprazole (PRILOSEC) 20 MG capsule Take 1 capsule (20 mg total) by mouth daily. 30 capsule 6  . polyethylene glycol (MIRALAX / GLYCOLAX) packet Take 17 g by mouth daily. 14 each 0  . rosuvastatin (CRESTOR) 20 MG tablet Take 1 tablet (20 mg total) by mouth daily. 90 tablet 3  . senna (SENOKOT) 8.6 MG  tablet Take 1 tablet by mouth at bedtime as needed for constipation.     . trolamine salicylate (ASPERCREME) 10 % cream Apply 1 application topically 2 (two) times daily as needed for muscle pain.      No current facility-administered medications for this visit.     Allergies:   Ace inhibitors and Propofol    Social History:  The patient  reports that she quit smoking about 5 years ago. Her smoking use included Cigarettes. She has a 5.00 pack-year smoking history. She has never used smokeless tobacco. She reports that she drinks alcohol. She reports that she does not use drugs.   Family History:  The patient's family history includes Alcohol abuse in her brother and father; Aneurysm in her mother; CVA in her mother; Cancer in her brother; Colon cancer in her brother; Diabetes in her brother and sister; Heart attack in her brother and father; Heart disease in her mother and sister; Hypertension in her brother, father, mother, and sister; Stroke in her father and mother.    ROS:  Please see the history of present illness.   Otherwise, review of systems are positive for constipation.   All other systems are reviewed and negative.    PHYSICAL EXAM: VS:  BP 100/66   Pulse 70   Ht 5\' 5"  (1.651 m)   Wt 161 lb (73 kg)   SpO2 95%   BMI 26.79 kg/m  , BMI Body mass index is 26.79 kg/m. GENERAL:  Well  appearing HEENT:  Pupils equal round and reactive, fundi not visualized, oral mucosa unremarkable.   4 beat nystagmus with lateral gaze bilaterally. NECK:  No jugular venous distention, waveform within normal limits, carotid upstroke brisk and symmetric, no bruits.  L neck tender to palpation. LYMPHATICS:  No cervical adenopathy LUNGS:  Clear to auscultation bilaterally HEART:  RRR.  PMI not displaced or sustained,S1 and S2 within normal limits, no S3, no S4, no clicks, no rubs, no murmurs CHEST: L chest wall tender to palpation ABD:  Flat, positive bowel sounds normal in frequency in pitch, no bruits, no rebound, no guarding, no midline pulsatile mass, no hepatomegaly, no splenomegaly EXT:  2 plus pulses throughout, no edema, no cyanosis no clubbing SKIN:  No rashes no nodules NEURO:  Cranial nerves II through XII grossly intact, motor grossly intact throughout PSYCH:  Cognitively intact, oriented to person place and time   EKG:  EKG is not ordered today. The ekg ordered 01/25/16 demonstrates ventricularly paced at 70 bpm. Likely atrial fibrillation.  Echo 11/26/15: Study Conclusions  - Left ventricle: The cavity size was normal. There was severe concentric hypertrophy. Systolic function was vigorous. The estimated ejection fraction was in the range of 65% to 70%. Wall motion was normal; there were no regional wall motion abnormalities. Features are consistent with a pseudonormal left ventricular filling pattern, with concomitant abnormal relaxation and increased filling pressure (grade 2 diastolic dysfunction). - Aortic valve: Trileaflet; normal thickness, mildly calcified leaflets. - Mitral valve: There was trivial regurgitation. - Left atrium: The atrium was mildly dilated. - Atrial septum: There was increased thickness of the septum, consistent with lipomatous hypertrophy. - Tricuspid valve: There was trivial regurgitation.  ICD Interrogation 01/25/16:  St.  Just single chamber pacemaker Battery 2.78 V Presenting rhythm is atrial fibrillation Intermittently paced at 70 bpm. Underlying rhythm atrial fibrillation with complete heart block Threshold: 1V @ 0.58ms Sensing: not performed due to complete heart block. Impedance: 436 Ohms  No events noted   Recent  Labs: 07/22/2016: Magnesium 2.2 07/28/2016: ALT 21 08/17/2016: TSH 0.938 08/25/2016: Brain Natriuretic Peptide 210.5 09/18/2016: BUN 19; Creatinine, Ser 1.58; Hemoglobin 9.9; Platelets 268; Potassium 3.5; Sodium 138    Lipid Panel    Component Value Date/Time   CHOL 122 03/28/2015 1504   TRIG 86 03/28/2015 1504   HDL 42 (L) 03/28/2015 1504   CHOLHDL 2.9 03/28/2015 1504   VLDL 17 03/28/2015 1504   LDLCALC 63 03/28/2015 1504   LDLDIRECT 161.8 02/21/2013 1416      Wt Readings from Last 3 Encounters:  09/24/16 161 lb (73 kg)  09/18/16 167 lb (75.8 kg)  09/10/16 167 lb 9.6 oz (76 kg)      ASSESSMENT AND PLAN:  # Hypertension: Blood pressure is low and she reports dizziness.  She is not on any antihypertensives.  Her weight is down and she isn't eating well.  We will stop lasix and she will take lasix 20 mg daily as needed for weight gain of 2lb in one day or 5lb in one week. She will also take it if she develops shortnes of breath.  She does have a history of dizziness that predates her hospitalization.  # Hyperlipidemia: Continue rosuvastatin.  LDL 63 03/2015.  # Chronic atrial fibrillation, PPM, CHB:  Managed by Dr. Rayann Heman. She is not a candidate for anticoagulation due to GI bleed and and AVMs.  She is not a candidate for a Watchman due to complications of the screening TEE.  No nodal agent for now due to hypotension. This patients CHA2DS2-VASc Score and unadjusted Ischemic Stroke Rate (% per year) is equal to 4.8 % stroke rate/year from a score of 4  Above score calculated as 1 point each if present [CHF, HTN, DM, Vascular=MI/PAD/Aortic Plaque, Age if 65-74, or Female] Above score  calculated as 2 points each if present [Age > 75, or Stroke/TIA/TE]  # Chronic diastolic heart failure,  HCM, demand ischemia:  Ms. Dulany is euvolemic on exam today and she has no shortness of breath. Lasix as needed as above.  She was advised to continue limiting her salt intake.  Current medicines are reviewed at length with the patient today.  The patient does not have concerns regarding medicines.  The following changes have been made:  none  Labs/ tests ordered today include:   No orders of the defined types were placed in this encounter.   Disposition:   FU with Cartha Rotert C. Oval Linsey, MD, [facility] in 1 month   Signed, Ola Raap C. Oval Linsey, MD, [facility]  09/24/2016 9:07 PM    [place] she is she's grosslysince there is

## 2016-09-29 ENCOUNTER — Encounter: Payer: Self-pay | Admitting: Family Medicine

## 2016-09-29 ENCOUNTER — Ambulatory Visit (INDEPENDENT_AMBULATORY_CARE_PROVIDER_SITE_OTHER): Payer: Medicare Other | Admitting: Family Medicine

## 2016-09-29 VITALS — BP 141/81 | HR 69 | Temp 97.6°F | Ht 65.0 in | Wt 161.4 lb

## 2016-09-29 DIAGNOSIS — D500 Iron deficiency anemia secondary to blood loss (chronic): Secondary | ICD-10-CM | POA: Diagnosis not present

## 2016-09-29 DIAGNOSIS — R0602 Shortness of breath: Secondary | ICD-10-CM

## 2016-09-29 NOTE — Patient Instructions (Addendum)
  It was great seeing you today! Continue taking your iron supplement.  Please make an appointment to see Dr. Erik Obey in the next 1-2 days. His office number is [phone].

## 2016-09-29 NOTE — Progress Notes (Signed)
   Subjective:    Patient ID: [name], female    DOB: 1946-03-29, 70 y.o.   MRN: [id number]   CC: wants to know if she should be taking iron  HPI: has been taking iron twice a day for "a long time" and her heart PA suggested she follow up on if this is needed. Had iron studies during last hospitalization which were on low side of normal.   She is short of breath/has sore throat and has cough. Had procedure 2 weeks ago to biopsy granuloma in throat (complication from intubation in August). No fevers or chills, no sick contacts. Has been using inhaler as instructed without relief. Feels like her cough is barky in nature.   Smoking status reviewed- quit years ago  Review of Systems- see HPI   Objective:  BP (!) 141/81   Pulse 69   Temp 97.6 F (36.4 C) (Oral)   Ht 5\' 5"  (1.651 m)   Wt 73.2 kg (161 lb 6.4 oz)   SpO2 96%   BMI 26.86 kg/m  Vitals and nursing note reviewed  General: well nourished, in no acute distress HEENT: normocephalic, moist mucous membranes, no erythema or discharge noted in posterior oropharynx  Neck: supple, non-tender, without lymphadenopathy, no carotid bruit auscultated Cardiac: RRR, clear S1 and S2, no murmurs, rubs, or gallops Respiratory: stridorous upper airway sounds, lungs clear to auscultation bilaterally, no increased work of breathing Extremities: no edema in lower . Neuro: alert and oriented, no focal deficits   Assessment & Plan:    SOB (shortness of breath)  Lungs clear, O2 saturation 96% on room air Likely related to upper airway edema/stenosis   -Spoke with Otolaryngologist Dr. Erik Obey, he advised close follow up with him in 1-2 days, hold off on steroids for now, he suspects there is upper airway scarring -Patient verbalized understanding and agreement with plan  Anemia  Iron studies from 09/06/2016 indicate iron is at adequate level although on low side  -Encouraged patient to continue taking iron supplement as she  has been   Return if symptoms worsen or fail to improve.   Lucila Maine, DO Family Medicine Resident PGY-1

## 2016-09-29 NOTE — Assessment & Plan Note (Signed)
  Iron studies from 09/06/2016 indicate iron is at adequate level although on low side  -Encouraged patient to continue taking iron supplement as she has been

## 2016-09-29 NOTE — Assessment & Plan Note (Addendum)
  Lungs clear, O2 saturation 96% on room air Likely related to upper airway edema/stenosis   -Spoke with Otolaryngologist Dr. Erik Obey, he advised close follow up with him in 1-2 days, hold off on steroids for now, he suspects there is upper airway scarring contributing to her upper airway stridor -Patient verbalized understanding and agreement with plan

## 2016-09-30 ENCOUNTER — Other Ambulatory Visit: Payer: Self-pay

## 2016-10-02 NOTE — Patient Outreach (Signed)
[name]) Care Management  09/30/2016  [name] 02-23-1946 [id number]    RNCM arrived at patient's home for scheduled home visit. RNCM ranged door bell several times, along with calling paitent's home number. Both attempts went unanswered.   Plan: Make another attempt to contact patient later this month.

## 2016-10-07 ENCOUNTER — Other Ambulatory Visit: Payer: Self-pay

## 2016-10-07 NOTE — Patient Outreach (Signed)
[facility]) Care Management  10/07/2016   [name] March 12, 1946 [id number]  Subjective:  I am doing better.  I still have some problems with my throat  Objective:  Telephone contact  Current Medications:  Current Outpatient Prescriptions  Medication Sig Dispense Refill  . acetaminophen (TYLENOL) 325 MG tablet Take 325 mg by mouth daily as needed (pain).    [name] albuterol (PROVENTIL) (2.5 MG/3ML) 0.083% nebulizer solution Take 3 mLs (2.5 mg total) by nebulization every 6 (six) hours as needed for wheezing or shortness of breath. 150 mL 1  . ALPRAZolam (XANAX) 0.5 MG tablet Take 1 tablet (0.5 mg total) by mouth at bedtime as needed for anxiety. 15 tablet 0  . aspirin EC 81 MG tablet Take 81 mg by mouth every morning.     . bisacodyl (DULCOLAX) 10 MG suppository Place 10 mg rectally daily as needed for mild constipation.     [name] CALCIUM PO Take 1 tablet by mouth once a week.     . ferrous sulfate 325 (65 FE) MG tablet TAKE 1 TABLET BY MOUTH TWICE DAILY WITH MEALS TO KEEP BLOOD COUNT UP (Patient taking differently: Take 325 mg by mouth 2 (two) times daily with a meal. TO KEEP BLOOD COUNT UP) 60 tablet 11  . furosemide (LASIX) 20 MG tablet Take 20 mg by mouth as directed. TAKE 1 TABLET BY MOUTH AS NEEDED FOR WEIGHT GAIN (2 LBS IN 24 HRS OR 5 LBS IN 1 WEEK)    . nitroGLYCERIN (NITROSTAT) 0.4 MG SL tablet Place 1 tablet (0.4 mg total) under the tongue every 5 (five) minutes as needed for chest pain. 25 tablet 1  . omeprazole (PRILOSEC) 20 MG capsule Take 1 capsule (20 mg total) by mouth daily. 30 capsule 6  . polyethylene glycol (MIRALAX / GLYCOLAX) packet Take 17 g by mouth daily. 14 each 0  . rosuvastatin (CRESTOR) 20 MG tablet Take 1 tablet (20 mg total) by mouth daily. 90 tablet 3  . senna (SENOKOT) 8.6 MG tablet Take 1 tablet by mouth at bedtime as needed for constipation.     . trolamine salicylate (ASPERCREME) 10 % cream Apply 1 application topically 2 (two)  times daily as needed for muscle pain.      No current facility-administered medications for this visit.     Functional Status:  In your present state of health, do you have any difficulty performing the following activities: 09/22/2016 09/18/2016  Hearing? N N  Vision? Y N  Difficulty concentrating or making decisions? N N  Walking or climbing stairs? Y Y  Dressing or bathing? N Y  Doing errands, shopping? Y -  Conservation officer, nature and eating ? Y -  Using the Toilet? N -  In the past six months, have you accidently leaked urine? N -  Do you have problems with loss of bowel control? N -  Managing your Medications? N -  Managing your Finances? N -  Housekeeping or managing your Housekeeping? Y -  Some recent data might be hidden    Fall/Depression Screening: PHQ 2/9 Scores 09/29/2016 09/22/2016 08/27/2016 08/26/2016 02/07/2016 12/31/2015 10/05/2015  PHQ - 2 Score 0 0 0 0 0 0 0   . [facility] CM Care Plan Problem One   Flowsheet Row Most Recent Value  Care Plan Problem One  knowledge deficit related to heart failure managemewnt  Role Documenting the Problem One  Care Management Coordinator  Care Plan for Problem One  Active  THN Long Term Goal (31-90  days)  In the next 31 days, patient will be able to verbalize the 3 zones of the Heart Failure Action Plan  THN Long Term Goal Start Date  09/22/16  Interventions for Problem One Long Term Goal  telephone contact made to assess patient's progession towards reaching her case management goals  THN CM Short Term Goal #1 (0-30 days)  in the next 28 days, patient will meet with [facility] for COPD Health education  [facility] CM Short Term Goal #1 Start Date  09/22/16  Interventions for Short Term Goal #1  EMMI videos scheduled for patient, s viewing via email    [facility] CM Care Plan Problem Two   Flowsheet Row Most Recent Value  Care Plan Problem Two  knowledge deficit related to COPD  Role Documenting the Problem Two  Care Management Coordinator  Care Plan for Problem  Two  Active  THN CM Short Term Goal #1 (0-30 days)  In the next 21 days, patient will meet with [facility] RNCM for COPD Education  [facility] CM Short Term Goal #1 Start Date  09/22/16  Interventions for Short Term Goal #2   Post discharge telephone folow up to assess barriers preventing patient from reaching her case management goals     Assessment:  Patient continues to recooperate from recent hospitalization. COPD EMMI videos scheduled for patient's viewing at her convenience.   Plan:  Telephone contact withing the next 30 days to assess patient's progression in meeting her case management goals

## 2016-10-14 ENCOUNTER — Other Ambulatory Visit: Payer: Self-pay

## 2016-10-14 NOTE — Patient Outreach (Signed)
[facility]) Care Management  10/14/2016   [name] 05-12-1946 [id number]  Subjective:  I have to have more surgery in November. I am still having problems with the scaring.   At least I know it's not cancer.   Objective:  Telephonic contact  Current Medications:  Current Outpatient Prescriptions  Medication Sig Dispense Refill  . acetaminophen (TYLENOL) 325 MG tablet Take 325 mg by mouth daily as needed (pain).    [name] albuterol (PROVENTIL) (2.5 MG/3ML) 0.083% nebulizer solution Take 3 mLs (2.5 mg total) by nebulization every 6 (six) hours as needed for wheezing or shortness of breath. 150 mL 1  . ALPRAZolam (XANAX) 0.5 MG tablet Take 1 tablet (0.5 mg total) by mouth at bedtime as needed for anxiety. 15 tablet 0  . aspirin EC 81 MG tablet Take 81 mg by mouth every morning.     . bisacodyl (DULCOLAX) 10 MG suppository Place 10 mg rectally daily as needed for mild constipation.     [name] CALCIUM PO Take 1 tablet by mouth once a week.     . ferrous sulfate 325 (65 FE) MG tablet TAKE 1 TABLET BY MOUTH TWICE DAILY WITH MEALS TO KEEP BLOOD COUNT UP (Patient taking differently: Take 325 mg by mouth 2 (two) times daily with a meal. TO KEEP BLOOD COUNT UP) 60 tablet 11  . furosemide (LASIX) 20 MG tablet Take 20 mg by mouth as directed. TAKE 1 TABLET BY MOUTH AS NEEDED FOR WEIGHT GAIN (2 LBS IN 24 HRS OR 5 LBS IN 1 WEEK)    . nitroGLYCERIN (NITROSTAT) 0.4 MG SL tablet Place 1 tablet (0.4 mg total) under the tongue every 5 (five) minutes as needed for chest pain. 25 tablet 1  . omeprazole (PRILOSEC) 20 MG capsule Take 1 capsule (20 mg total) by mouth daily. 30 capsule 6  . polyethylene glycol (MIRALAX / GLYCOLAX) packet Take 17 g by mouth daily. 14 each 0  . rosuvastatin (CRESTOR) 20 MG tablet Take 1 tablet (20 mg total) by mouth daily. 90 tablet 3  . senna (SENOKOT) 8.6 MG tablet Take 1 tablet by mouth at bedtime as needed for constipation.     . trolamine salicylate  (ASPERCREME) 10 % cream Apply 1 application topically 2 (two) times daily as needed for muscle pain.      No current facility-administered medications for this visit.     Functional Status:  In your present state of health, do you have any difficulty performing the following activities: 09/22/2016 09/18/2016  Hearing? N N  Vision? Y N  Difficulty concentrating or making decisions? N N  Walking or climbing stairs? Y Y  Dressing or bathing? N Y  Doing errands, shopping? Y -  Conservation officer, nature and eating ? Y -  Using the Toilet? N -  In the past six months, have you accidently leaked urine? N -  Do you have problems with loss of bowel control? N -  Managing your Medications? N -  Managing your Finances? N -  Housekeeping or managing your Housekeeping? Y -  Some recent data might be hidden    Fall/Depression Screening: PHQ 2/9 Scores 09/29/2016 09/22/2016 08/27/2016 08/26/2016 02/07/2016 12/31/2015 10/05/2015  PHQ - 2 Score 0 0 0 0 0 0 0    THN CM Care Plan Problem One   Flowsheet Row Most Recent Value  Care Plan Problem One  knowledge deficit related to heart failure managemewnt  Role Documenting the Problem One  Care Management Coordinator  Care Plan for Problem One  Active  THN Long Term Goal (31-90 days)  In the next 31 days, patient will be able to verbalize the 3 zones of the Heart Failure Action Plan  THN Long Term Goal Start Date  09/22/16  Interventions for Problem One Long Term Goal  telephone contact made to assess patient's progession towards reaching her case management goals  THN CM Short Term Goal #1 (0-30 days)  in the next 28 days, patient will meet with [facility] for COPD Health education  [facility] CM Short Term Goal #1 Start Date  09/22/16  Interventions for Short Term Goal #1  telephone call made to patient who states she si using her maintenance and resue inhalers. patient has surgery scheduled for next month to remove scar tissue from the airway which is causing difficulty  breathing.    [facility] CM Care Plan Problem Two   Flowsheet Row Most Recent Value  Care Plan Problem Two  knowledge deficit related to COPD  Role Documenting the Problem Two  Care Management Coordinator  Care Plan for Problem Two  Active  THN CM Short Term Goal #1 (0-30 days)  In the next 21 days, patient will meet with [facility] RNCM for COPD Education  [facility] CM Short Term Goal #1 Start Date  09/22/16  [facility] CM Short Term Goal #1 Met Date   10/14/16  Interventions for Short Term Goal #2   telehpone cotnact to follow up to assess patient's need for further COPD education     Assessment:  Patient is scheduled for surgery in November. Patient states she uses her rescue inhaler weekly and is compliant with use of her maintence inhaler. Patient states she gets relief when she has used her rescue inhaler.  Plan:  Telephone call next week for community care coordination.

## 2016-10-15 ENCOUNTER — Emergency Department (HOSPITAL_COMMUNITY): Payer: Medicare Other

## 2016-10-15 ENCOUNTER — Encounter (HOSPITAL_COMMUNITY): Payer: Self-pay | Admitting: Emergency Medicine

## 2016-10-15 ENCOUNTER — Inpatient Hospital Stay (HOSPITAL_COMMUNITY)
Admission: EM | Admit: 2016-10-15 | Discharge: 2016-11-04 | DRG: 011 | Disposition: A | Discharge status: Home Health | Payer: Medicare Other | Attending: Family Medicine | Admitting: Family Medicine

## 2016-10-15 DIAGNOSIS — Q315 Congenital laryngomalacia: Secondary | ICD-10-CM

## 2016-10-15 DIAGNOSIS — Z4682 Encounter for fitting and adjustment of non-vascular catheter: Secondary | ICD-10-CM | POA: Diagnosis not present

## 2016-10-15 DIAGNOSIS — Z823 Family history of stroke: Secondary | ICD-10-CM

## 2016-10-15 DIAGNOSIS — J386 Stenosis of larynx: Principal | ICD-10-CM | POA: Diagnosis present

## 2016-10-15 DIAGNOSIS — I481 Persistent atrial fibrillation: Secondary | ICD-10-CM | POA: Diagnosis not present

## 2016-10-15 DIAGNOSIS — I5032 Chronic diastolic (congestive) heart failure: Secondary | ICD-10-CM | POA: Diagnosis not present

## 2016-10-15 DIAGNOSIS — I2510 Atherosclerotic heart disease of native coronary artery without angina pectoris: Secondary | ICD-10-CM | POA: Diagnosis not present

## 2016-10-15 DIAGNOSIS — J385 Laryngeal spasm: Secondary | ICD-10-CM

## 2016-10-15 DIAGNOSIS — Z8249 Family history of ischemic heart disease and other diseases of the circulatory system: Secondary | ICD-10-CM

## 2016-10-15 DIAGNOSIS — E1165 Type 2 diabetes mellitus with hyperglycemia: Secondary | ICD-10-CM | POA: Diagnosis present

## 2016-10-15 DIAGNOSIS — I422 Other hypertrophic cardiomyopathy: Secondary | ICD-10-CM | POA: Diagnosis not present

## 2016-10-15 DIAGNOSIS — Z930 Tracheostomy status: Secondary | ICD-10-CM | POA: Diagnosis not present

## 2016-10-15 DIAGNOSIS — J441 Chronic obstructive pulmonary disease with (acute) exacerbation: Secondary | ICD-10-CM

## 2016-10-15 DIAGNOSIS — J9601 Acute respiratory failure with hypoxia: Secondary | ICD-10-CM | POA: Diagnosis not present

## 2016-10-15 DIAGNOSIS — J9811 Atelectasis: Secondary | ICD-10-CM | POA: Diagnosis not present

## 2016-10-15 DIAGNOSIS — E1122 Type 2 diabetes mellitus with diabetic chronic kidney disease: Secondary | ICD-10-CM | POA: Diagnosis not present

## 2016-10-15 DIAGNOSIS — J9690 Respiratory failure, unspecified, unspecified whether with hypoxia or hypercapnia: Secondary | ICD-10-CM

## 2016-10-15 DIAGNOSIS — R062 Wheezing: Secondary | ICD-10-CM | POA: Diagnosis not present

## 2016-10-15 DIAGNOSIS — I959 Hypotension, unspecified: Secondary | ICD-10-CM | POA: Diagnosis not present

## 2016-10-15 DIAGNOSIS — R262 Difficulty in walking, not elsewhere classified: Secondary | ICD-10-CM

## 2016-10-15 DIAGNOSIS — G4733 Obstructive sleep apnea (adult) (pediatric): Secondary | ICD-10-CM | POA: Diagnosis present

## 2016-10-15 DIAGNOSIS — R0689 Other abnormalities of breathing: Secondary | ICD-10-CM | POA: Diagnosis not present

## 2016-10-15 DIAGNOSIS — I130 Hypertensive heart and chronic kidney disease with heart failure and stage 1 through stage 4 chronic kidney disease, or unspecified chronic kidney disease: Secondary | ICD-10-CM | POA: Diagnosis present

## 2016-10-15 DIAGNOSIS — N189 Chronic kidney disease, unspecified: Secondary | ICD-10-CM

## 2016-10-15 DIAGNOSIS — E785 Hyperlipidemia, unspecified: Secondary | ICD-10-CM | POA: Diagnosis present

## 2016-10-15 DIAGNOSIS — I4891 Unspecified atrial fibrillation: Secondary | ICD-10-CM | POA: Diagnosis not present

## 2016-10-15 DIAGNOSIS — K219 Gastro-esophageal reflux disease without esophagitis: Secondary | ICD-10-CM | POA: Diagnosis present

## 2016-10-15 DIAGNOSIS — G9340 Encephalopathy, unspecified: Secondary | ICD-10-CM

## 2016-10-15 DIAGNOSIS — I272 Pulmonary hypertension, unspecified: Secondary | ICD-10-CM | POA: Diagnosis present

## 2016-10-15 DIAGNOSIS — I482 Chronic atrial fibrillation: Secondary | ICD-10-CM

## 2016-10-15 DIAGNOSIS — Z87891 Personal history of nicotine dependence: Secondary | ICD-10-CM | POA: Diagnosis not present

## 2016-10-15 DIAGNOSIS — E870 Hyperosmolality and hypernatremia: Secondary | ICD-10-CM | POA: Diagnosis present

## 2016-10-15 DIAGNOSIS — R061 Stridor: Secondary | ICD-10-CM | POA: Diagnosis not present

## 2016-10-15 DIAGNOSIS — Z23 Encounter for immunization: Secondary | ICD-10-CM | POA: Diagnosis not present

## 2016-10-15 DIAGNOSIS — I10 Essential (primary) hypertension: Secondary | ICD-10-CM | POA: Diagnosis not present

## 2016-10-15 DIAGNOSIS — J398 Other specified diseases of upper respiratory tract: Secondary | ICD-10-CM

## 2016-10-15 DIAGNOSIS — I248 Other forms of acute ischemic heart disease: Secondary | ICD-10-CM | POA: Diagnosis not present

## 2016-10-15 DIAGNOSIS — D649 Anemia, unspecified: Secondary | ICD-10-CM | POA: Diagnosis present

## 2016-10-15 DIAGNOSIS — E119 Type 2 diabetes mellitus without complications: Secondary | ICD-10-CM

## 2016-10-15 DIAGNOSIS — R0602 Shortness of breath: Secondary | ICD-10-CM | POA: Diagnosis not present

## 2016-10-15 DIAGNOSIS — R0603 Acute respiratory distress: Secondary | ICD-10-CM | POA: Diagnosis not present

## 2016-10-15 DIAGNOSIS — G4730 Sleep apnea, unspecified: Secondary | ICD-10-CM | POA: Diagnosis present

## 2016-10-15 DIAGNOSIS — N179 Acute kidney failure, unspecified: Secondary | ICD-10-CM

## 2016-10-15 DIAGNOSIS — J449 Chronic obstructive pulmonary disease, unspecified: Secondary | ICD-10-CM | POA: Diagnosis not present

## 2016-10-15 DIAGNOSIS — I517 Cardiomegaly: Secondary | ICD-10-CM | POA: Diagnosis not present

## 2016-10-15 DIAGNOSIS — D72829 Elevated white blood cell count, unspecified: Secondary | ICD-10-CM | POA: Diagnosis not present

## 2016-10-15 DIAGNOSIS — T783XXA Angioneurotic edema, initial encounter: Secondary | ICD-10-CM | POA: Diagnosis present

## 2016-10-15 DIAGNOSIS — G2581 Restless legs syndrome: Secondary | ICD-10-CM | POA: Diagnosis present

## 2016-10-15 DIAGNOSIS — Z48813 Encounter for surgical aftercare following surgery on the respiratory system: Secondary | ICD-10-CM | POA: Diagnosis not present

## 2016-10-15 DIAGNOSIS — Z4659 Encounter for fitting and adjustment of other gastrointestinal appliance and device: Secondary | ICD-10-CM

## 2016-10-15 DIAGNOSIS — E876 Hypokalemia: Secondary | ICD-10-CM

## 2016-10-15 DIAGNOSIS — R569 Unspecified convulsions: Secondary | ICD-10-CM | POA: Diagnosis not present

## 2016-10-15 DIAGNOSIS — J9600 Acute respiratory failure, unspecified whether with hypoxia or hypercapnia: Secondary | ICD-10-CM

## 2016-10-15 DIAGNOSIS — R1310 Dysphagia, unspecified: Secondary | ICD-10-CM | POA: Diagnosis not present

## 2016-10-15 DIAGNOSIS — K760 Fatty (change of) liver, not elsewhere classified: Secondary | ICD-10-CM | POA: Diagnosis present

## 2016-10-15 DIAGNOSIS — R0600 Dyspnea, unspecified: Secondary | ICD-10-CM

## 2016-10-15 DIAGNOSIS — N183 Chronic kidney disease, stage 3 (moderate): Secondary | ICD-10-CM | POA: Diagnosis not present

## 2016-10-15 DIAGNOSIS — F329 Major depressive disorder, single episode, unspecified: Secondary | ICD-10-CM | POA: Diagnosis present

## 2016-10-15 DIAGNOSIS — Z950 Presence of cardiac pacemaker: Secondary | ICD-10-CM

## 2016-10-15 DIAGNOSIS — Z7951 Long term (current) use of inhaled steroids: Secondary | ICD-10-CM | POA: Diagnosis not present

## 2016-10-15 DIAGNOSIS — Z430 Encounter for attention to tracheostomy: Secondary | ICD-10-CM | POA: Diagnosis not present

## 2016-10-15 DIAGNOSIS — R0902 Hypoxemia: Secondary | ICD-10-CM | POA: Diagnosis not present

## 2016-10-15 DIAGNOSIS — I509 Heart failure, unspecified: Secondary | ICD-10-CM | POA: Diagnosis not present

## 2016-10-15 DIAGNOSIS — I4821 Permanent atrial fibrillation: Secondary | ICD-10-CM | POA: Insufficient documentation

## 2016-10-15 DIAGNOSIS — R748 Abnormal levels of other serum enzymes: Secondary | ICD-10-CM

## 2016-10-15 DIAGNOSIS — E7800 Pure hypercholesterolemia, unspecified: Secondary | ICD-10-CM | POA: Diagnosis present

## 2016-10-15 DIAGNOSIS — Z7982 Long term (current) use of aspirin: Secondary | ICD-10-CM

## 2016-10-15 DIAGNOSIS — I110 Hypertensive heart disease with heart failure: Secondary | ICD-10-CM | POA: Diagnosis not present

## 2016-10-15 LAB — COMPREHENSIVE METABOLIC PANEL
ALBUMIN: 3.4 g/dL — AB (ref 3.5–5.0)
ALK PHOS: 54 U/L (ref 38–126)
ALT: 9 U/L — ABNORMAL LOW (ref 14–54)
ANION GAP: 15 (ref 5–15)
AST: 30 U/L (ref 15–41)
BUN: 14 mg/dL (ref 6–20)
CALCIUM: 9.9 mg/dL (ref 8.9–10.3)
CO2: 32 mmol/L (ref 22–32)
Chloride: 93 mmol/L — ABNORMAL LOW (ref 101–111)
Creatinine, Ser: 2.07 mg/dL — ABNORMAL HIGH (ref 0.44–1.00)
GFR calc Af Amer: 26 mL/min — ABNORMAL LOW (ref >60)
GFR calc non Af Amer: 23 mL/min — ABNORMAL LOW (ref >60)
GLUCOSE: 180 mg/dL — AB (ref 65–99)
Potassium: 2.4 mmol/L — CL (ref 3.5–5.1)
SODIUM: 140 mmol/L (ref 135–145)
Total Bilirubin: 0.5 mg/dL (ref 0.3–1.2)
Total Protein: 7.3 g/dL (ref 6.5–8.1)

## 2016-10-15 LAB — CBC WITH DIFFERENTIAL/PLATELET
BAND NEUTROPHILS: 3 %
BASOS ABS: 0.3 10*3/uL — AB (ref 0.0–0.1)
BLASTS: 0 %
Basophils Relative: 2 %
EOS ABS: 0 10*3/uL (ref 0.0–0.7)
Eosinophils Relative: 0 %
HCT: 34.4 % — ABNORMAL LOW (ref 36.0–46.0)
HEMOGLOBIN: 10.5 g/dL — AB (ref 12.0–15.0)
Lymphocytes Relative: 39 %
Lymphs Abs: 5 10*3/uL — ABNORMAL HIGH (ref 0.7–4.0)
MCH: 26.6 pg (ref 26.0–34.0)
MCHC: 30.5 g/dL (ref 30.0–36.0)
MCV: 87.3 fL (ref 78.0–100.0)
METAMYELOCYTES PCT: 1 %
MYELOCYTES: 0 %
Monocytes Absolute: 0.6 10*3/uL (ref 0.1–1.0)
Monocytes Relative: 5 %
NEUTROS ABS: 5.9 10*3/uL (ref 1.7–7.7)
Neutrophils Relative %: 42 %
Other: 8 %
PROMYELOCYTES ABS: 0 %
Platelets: 363 10*3/uL (ref 150–400)
RBC: 3.94 MIL/uL (ref 3.87–5.11)
RDW: 15.3 % (ref 11.5–15.5)
WBC: 12.9 10*3/uL — AB (ref 4.0–10.5)
nRBC: 0 /100 WBC

## 2016-10-15 LAB — TROPONIN I
TROPONIN I: 0.93 ng/mL — AB (ref <0.03)
TROPONIN I: 1.03 ng/mL — AB (ref <0.03)

## 2016-10-15 LAB — I-STAT ARTERIAL BLOOD GAS, ED
ACID-BASE EXCESS: 9 mmol/L — AB (ref 0.0–2.0)
ACID-BASE EXCESS: 9 mmol/L — AB (ref 0.0–2.0)
BICARBONATE: 31 mmol/L — AB (ref 20.0–28.0)
Bicarbonate: 35.1 mmol/L — ABNORMAL HIGH (ref 20.0–28.0)
O2 SAT: 70 %
O2 SAT: 100 %
PCO2 ART: 29.8 mmHg — AB (ref 32.0–48.0)
PH ART: 7.431 (ref 7.350–7.450)
PO2 ART: 35 mmHg — AB (ref 83.0–108.0)
PO2 ART: 141 mmHg — AB (ref 83.0–108.0)
Patient temperature: 97.6
TCO2: 37 mmol/L (ref 0–100)
TCO2: 32 mmol/L (ref 0–100)
pCO2 arterial: 52.4 mmHg — ABNORMAL HIGH (ref 32.0–48.0)
pH, Arterial: 7.624 (ref 7.350–7.450)

## 2016-10-15 LAB — GLUCOSE, CAPILLARY
GLUCOSE-CAPILLARY: 207 mg/dL — AB (ref 65–99)
Glucose-Capillary: 207 mg/dL — ABNORMAL HIGH (ref 65–99)

## 2016-10-15 LAB — I-STAT TROPONIN, ED: Troponin i, poc: 0.57 ng/mL (ref 0.00–0.08)

## 2016-10-15 LAB — CBG MONITORING, ED: GLUCOSE-CAPILLARY: 207 mg/dL — AB (ref 65–99)

## 2016-10-15 LAB — CREATININE, SERUM
CREATININE: 2.27 mg/dL — AB (ref 0.44–1.00)
GFR calc Af Amer: 24 mL/min — ABNORMAL LOW (ref >60)
GFR, EST NON AFRICAN AMERICAN: 20 mL/min — AB (ref >60)

## 2016-10-15 LAB — MAGNESIUM: MAGNESIUM: 2 mg/dL (ref 1.7–2.4)

## 2016-10-15 LAB — MRSA PCR SCREENING: MRSA BY PCR: NEGATIVE

## 2016-10-15 MED ORDER — SODIUM CHLORIDE 0.9 % IV SOLN
INTRAVENOUS | Status: DC
  Administered 2016-10-15 (×2): via INTRAVENOUS

## 2016-10-15 MED ORDER — LORAZEPAM 2 MG/ML IJ SOLN
INTRAMUSCULAR | Status: AC
  Administered 2016-10-15: 0.5 mg
  Filled 2016-10-15: qty 1

## 2016-10-15 MED ORDER — MIDAZOLAM HCL 2 MG/2ML IJ SOLN
INTRAMUSCULAR | Status: AC
  Administered 2016-10-15: 2 mg
  Filled 2016-10-15: qty 4

## 2016-10-15 MED ORDER — FAMOTIDINE IN NACL 20-0.9 MG/50ML-% IV SOLN
20.0000 mg | Freq: Two times a day (BID) | INTRAVENOUS | Status: DC
Start: 2016-10-15 — End: 2016-10-15
  Filled 2016-10-15: qty 50

## 2016-10-15 MED ORDER — FENTANYL CITRATE (PF) 100 MCG/2ML IJ SOLN
50.0000 ug | Freq: Once | INTRAMUSCULAR | Status: AC
  Administered 2016-10-15: 50 ug via INTRAVENOUS

## 2016-10-15 MED ORDER — FENTANYL 2500MCG IN NS 250ML (10MCG/ML) PREMIX INFUSION
25.0000 ug/h | INTRAVENOUS | Status: DC
  Administered 2016-10-15: 50 ug/h via INTRAVENOUS
  Administered 2016-10-16: 125 ug/h via INTRAVENOUS
  Filled 2016-10-15 (×2): qty 250

## 2016-10-15 MED ORDER — POTASSIUM CHLORIDE 20 MEQ/15ML (10%) PO SOLN: 40.0000 meq | Freq: Once | ORAL | Status: DC

## 2016-10-15 MED ORDER — ROSUVASTATIN CALCIUM 20 MG PO TABS: 20.0000 mg | ORAL_TABLET | Freq: Every day | ORAL | Status: DC

## 2016-10-15 MED ORDER — LORAZEPAM 2 MG/ML IJ SOLN: 0.5000 mg | Freq: Once | INTRAMUSCULAR | Status: DC

## 2016-10-15 MED ORDER — CEFAZOLIN SODIUM-DEXTROSE 2-4 GM/100ML-% IV SOLN
2.0000 g | INTRAVENOUS | Status: AC
  Administered 2016-10-16: 2 g via INTRAVENOUS
  Filled 2016-10-15: qty 100

## 2016-10-15 MED ORDER — CHLORHEXIDINE GLUCONATE 0.12% ORAL RINSE (MEDLINE KIT)
15.0000 mL | Freq: Two times a day (BID) | OROMUCOSAL | Status: DC
  Administered 2016-10-15 – 2016-10-22 (×14): 15 mL via OROMUCOSAL

## 2016-10-15 MED ORDER — FAMOTIDINE IN NACL 20-0.9 MG/50ML-% IV SOLN
20.0000 mg | INTRAVENOUS | Status: DC
  Administered 2016-10-15 – 2016-10-16 (×2): 20 mg via INTRAVENOUS
  Filled 2016-10-15 (×3): qty 50

## 2016-10-15 MED ORDER — FENTANYL CITRATE (PF) 100 MCG/2ML IJ SOLN
50.0000 ug | Freq: Once | INTRAMUSCULAR | Status: DC
  Filled 2016-10-15: qty 2

## 2016-10-15 MED ORDER — MIDAZOLAM HCL 2 MG/2ML IJ SOLN: 1.0000 mg | INTRAMUSCULAR | Status: DC | PRN

## 2016-10-15 MED ORDER — ORAL CARE MOUTH RINSE
15.0000 mL | Freq: Four times a day (QID) | OROMUCOSAL | Status: DC
  Administered 2016-10-16 – 2016-10-22 (×24): 15 mL via OROMUCOSAL

## 2016-10-15 MED ORDER — DEXAMETHASONE SODIUM PHOSPHATE 4 MG/ML IJ SOLN
4.0000 mg | Freq: Four times a day (QID) | INTRAMUSCULAR | Status: DC
Start: 2016-10-15 — End: 2016-10-22
  Administered 2016-10-15 – 2016-10-22 (×28): 4 mg via INTRAVENOUS
  Filled 2016-10-15 (×28): qty 1

## 2016-10-15 MED ORDER — INSULIN ASPART 100 UNIT/ML ~~LOC~~ SOLN
0.0000 [IU] | SUBCUTANEOUS | Status: DC
  Administered 2016-10-15: 3 [IU] via SUBCUTANEOUS
  Administered 2016-10-16 (×5): 2 [IU] via SUBCUTANEOUS
  Administered 2016-10-17: 1 [IU] via SUBCUTANEOUS
  Administered 2016-10-17: 2 [IU] via SUBCUTANEOUS
  Administered 2016-10-17 (×2): 1 [IU] via SUBCUTANEOUS
  Administered 2016-10-17 (×2): 2 [IU] via SUBCUTANEOUS
  Administered 2016-10-18 (×2): 1 [IU] via SUBCUTANEOUS
  Administered 2016-10-18: 2 [IU] via SUBCUTANEOUS
  Administered 2016-10-18 (×2): 1 [IU] via SUBCUTANEOUS
  Administered 2016-10-18 – 2016-10-19 (×3): 2 [IU] via SUBCUTANEOUS
  Administered 2016-10-19 (×2): 3 [IU] via SUBCUTANEOUS
  Administered 2016-10-19 – 2016-10-21 (×9): 2 [IU] via SUBCUTANEOUS
  Administered 2016-10-21 (×3): 3 [IU] via SUBCUTANEOUS
  Administered 2016-10-21 (×2): 2 [IU] via SUBCUTANEOUS
  Administered 2016-10-21 – 2016-10-22 (×3): 3 [IU] via SUBCUTANEOUS
  Administered 2016-10-22 (×3): 2 [IU] via SUBCUTANEOUS
  Administered 2016-10-23: 1 [IU] via SUBCUTANEOUS
  Administered 2016-10-23: 2 [IU] via SUBCUTANEOUS
  Administered 2016-10-23: 1 [IU] via SUBCUTANEOUS

## 2016-10-15 MED ORDER — HEPARIN SODIUM (PORCINE) 5000 UNIT/ML IJ SOLN
5000.0000 [IU] | Freq: Three times a day (TID) | INTRAMUSCULAR | Status: DC
  Administered 2016-10-15 – 2016-10-21 (×16): 5000 [IU] via SUBCUTANEOUS
  Filled 2016-10-15 (×18): qty 1

## 2016-10-15 MED ORDER — SODIUM CHLORIDE 0.9 % IV SOLN
250.0000 mL | INTRAVENOUS | Status: DC | PRN
  Administered 2016-10-20: 12:00:00 via INTRAVENOUS
  Administered 2016-10-23: 250 mL via INTRAVENOUS

## 2016-10-15 MED ORDER — ETOMIDATE 2 MG/ML IV SOLN
INTRAVENOUS | Status: AC | PRN
  Administered 2016-10-15: 20 mg via INTRAVENOUS

## 2016-10-15 MED ORDER — FENTANYL CITRATE (PF) 100 MCG/2ML IJ SOLN
INTRAMUSCULAR | Status: AC
  Filled 2016-10-15: qty 4

## 2016-10-15 MED ORDER — ASPIRIN 81 MG PO CHEW
81.0000 mg | CHEWABLE_TABLET | Freq: Every day | ORAL | Status: DC
  Administered 2016-10-16 – 2016-11-04 (×12): 81 mg
  Filled 2016-10-15 (×13): qty 1

## 2016-10-15 MED ORDER — SUCCINYLCHOLINE CHLORIDE 20 MG/ML IJ SOLN
INTRAMUSCULAR | Status: AC | PRN
  Administered 2016-10-15: 100 mg via INTRAVENOUS

## 2016-10-15 MED ORDER — FENTANYL BOLUS VIA INFUSION
25.0000 ug | INTRAVENOUS | Status: DC | PRN
  Filled 2016-10-15: qty 50

## 2016-10-15 MED ORDER — ALBUTEROL (5 MG/ML) CONTINUOUS INHALATION SOLN
10.0000 mg/h | INHALATION_SOLUTION | Freq: Once | RESPIRATORY_TRACT | Status: AC
  Administered 2016-10-15: 10 mg/h via RESPIRATORY_TRACT
  Filled 2016-10-15: qty 20

## 2016-10-15 MED ORDER — SODIUM CHLORIDE 0.9 % IV SOLN
0.0000 mg/h | INTRAVENOUS | Status: DC
  Administered 2016-10-15: 2 mg/h via INTRAVENOUS
  Filled 2016-10-15: qty 10

## 2016-10-15 MED ORDER — LORAZEPAM 2 MG/ML IJ SOLN
0.5000 mg | Freq: Once | INTRAMUSCULAR | Status: AC
  Administered 2016-10-15: 0.5 mg via INTRAVENOUS
  Filled 2016-10-15: qty 1

## 2016-10-15 MED ORDER — ALBUTEROL SULFATE (2.5 MG/3ML) 0.083% IN NEBU: 2.5000 mg | INHALATION_SOLUTION | RESPIRATORY_TRACT | Status: DC | PRN

## 2016-10-15 MED ORDER — POTASSIUM CHLORIDE 10 MEQ/100ML IV SOLN
10.0000 meq | INTRAVENOUS | Status: AC
  Administered 2016-10-15 (×3): 10 meq via INTRAVENOUS
  Filled 2016-10-15 (×3): qty 100

## 2016-10-15 MED ORDER — ROSUVASTATIN CALCIUM 10 MG PO TABS
10.0000 mg | ORAL_TABLET | Freq: Every day | ORAL | Status: DC
  Filled 2016-10-15: qty 1

## 2016-10-15 MED ORDER — FENTANYL BOLUS VIA INFUSION
25.0000 ug | INTRAVENOUS | Status: DC | PRN
  Filled 2016-10-15: qty 25

## 2016-10-15 NOTE — ED Notes (Signed)
Family at bedside. 

## 2016-10-15 NOTE — ED Notes (Signed)
Preparing to intubate pt. Emergency airway kit at bedside along with Dr.yelverton and respiratory.

## 2016-10-15 NOTE — ED Provider Notes (Signed)
[facility] DEPT Provider Note   CSN: [id number] Arrival date & time: 10/15/16  1031     History   Chief Complaint Chief Complaint  Patient presents with  . Respiratory Distress  . Tachycardia    HPI [name] is a 70 y.o. female.  HPI Patient with acute worsening of her shortness of breath. Patient has known COPD and chronic upper airway narrowing after recent intubation. History is limited due to respiratory distress. Level V caveat applies. Per EMS patient was stridorous on presentation. Given nebulized epinephrine, DuoNeb and 125 mg of Solu-Medrol en route. Some improvement in respiratory distress. No new lower sugary swelling or pain. Patient denies any chest pain currently though admits to episodic left-sided chest pain. Past Medical History:  Diagnosis Date  . Anemia   . Angina   . Angioedema    2/2 ACE  . Arteriovenous malformation of stomach   . Arthritis   . Asthma   . AVM (arteriovenous malformation) of colon    small intestine; stomach  . Blood transfusion   . CHF (congestive heart failure) ([place])   . Complete heart block ([place])    s/p PPM 1998  . Complication of anesthesia    Difficult airway; anaphylaxis and swelling with propofol  . DDD (degenerative disc disease)   . Depression   . Diastolic heart failure   . Fatty liver 07/26/10  . GERD (gastroesophageal reflux disease)   . GI bleed   . Headache   . History of alcohol abuse Stopped Fall 2012  . History of tobacco use Quit Fall 2012  . Hx of cardiovascular stress test    a. Lexiscan Myoview (10/15):  Small inferolateral and apical defect c/w scar and poss soft tissue attenuation, no ischemia, EF 42%  . Hx of colonic polyp 08/13/10   adenomatous  . Hx of colonoscopy   . Hyperlipidemia   . Hyperlipidemia   . Hypertension   . Hypertrophic cardiomyopathy ([place])    dx by Dr [name] 2009  . Iron deficiency anemia   . Panic attack   . Panic attacks   . Permanent atrial fibrillation ([place])     . Pneumonia   . Renal failure    baseline creatinine 1.6  . Right arm pain 01/08/2012  . RLS (restless legs syndrome)    Dx 06/2007  . Shortness of breath    sob on exertation  . Sleep apnea     Patient Active Problem List   Diagnosis Date Noted  . Laryngeal stenosis 09/18/2016  . Elevated troponin   . SOB (shortness of breath) 08/17/2016  . Delirium   . Esophageal abnormality   . Endotracheal tube present   . Somnolence   . Respiratory failure ([place]) 07/11/2016  . Persistent atrial fibrillation ([place]) 07/10/2016  . Acute respiratory failure ([place])   . Hypotension due to drugs   . Demand ischemia ([place])   . Chest discomfort 02/05/2016  . COPD exacerbation ([place])   . Palpitations   . Chest pain 11/25/2015  . Dyspnea on exertion   . Polymyalgia rheumatica ([place]) 04/10/2015  . Low back pain 03/28/2015  . Hypertrophic cardiomyopathy ([place]) 02/15/2015  . Eczema 09/18/2014  . Environmental allergies 09/18/2014  . Pre-diabetes 11/26/2013  . COPD, moderate ([place]) 11/01/2013  . Chronic renal failure, stage 3 (moderate) 08/28/2012  . Preventative health care 08/18/2012  . Complete heart block ([place]) 04/03/2011  . Anemia 02/17/2011  . AVM (arteriovenous malformation) 02/13/2011  . PACEMAKER-[place] 11/28/2010  . CHRONIC KIDNEY DISEASE  STAGE III (MODERATE) 09/24/2010  . Acute on chronic congestive heart failure ([place]) 08/30/2010  . ARTHRITIS 07/24/2010  . Generalized anxiety disorder 07/23/2010  . Atrial fibrillation ([place]) 07/16/2010  . Hyperlipidemia 02/11/2007  . Essential hypertension 02/11/2007  . APNEA, SLEEP 02/11/2007    Past Surgical History:  Procedure Laterality Date  . CARDIAC CATHETERIZATION    . CARDIAC CATHETERIZATION N/A 08/19/2016   Procedure: Right/Left Heart Cath and Coronary Angiography;  Surgeon: Jolaine Artist, MD;  Location: [facility];  Service: Cardiovascular;  Laterality: N/A;  . DIRECT LARYNGOSCOPY N/A 09/18/2016   Procedure: DIRECT  LARYNGOSCOPY, BRONCHOSCOPY, REMOVAL OF INTUBATION GRANULOMA;  Surgeon: [name], MD;  Location: [facility] OR;  Service: ENT;  Laterality: N/A;  . ESOPHAGOGASTRODUODENOSCOPY  12/23/2011   Procedure: ESOPHAGOGASTRODUODENOSCOPY (EGD);  Surgeon: [name], MD;  Location: [facility];  Service: Endoscopy;  Laterality: N/A;  . EXTUBATION (ENDOTRACHEAL) IN OR N/A 07/21/2016   Procedure: EXTUBATION (ENDOTRACHEAL) IN OR;  Surgeon: [name], MD;  Location: [facility];  Service: ENT;  Laterality: N/A;  . GIVENS CAPSULE STUDY  12/23/2011   Procedure: GIVENS CAPSULE STUDY;  Surgeon: [name], MD;  Location: [facility];  Service: Endoscopy;  Laterality: N/A;  . PACEMAKER INSERTION  1998   [facility], most recent gen change by [name] 4/12  . TUBAL LIGATION  04/01/2000    OB History    No data available       Home Medications    Prior to Admission medications   Medication Sig Start Date End Date Taking? Authorizing Provider  acetaminophen (TYLENOL) 325 MG tablet Take 325 mg by mouth daily as needed (pain).    Historical Provider, MD  albuterol (PROVENTIL) (2.5 MG/3ML) 0.083% nebulizer solution Take 3 mLs (2.5 mg total) by nebulization every 6 (six) hours as needed for wheezing or shortness of breath. 08/11/16   Dinah C Ngetich, NP  ALPRAZolam (XANAX) 0.5 MG tablet Take 1 tablet (0.5 mg total) by mouth at bedtime as needed for anxiety. 07/26/16   Domenic Polite, MD  aspirin EC 81 MG tablet Take 81 mg by mouth every morning.     Historical Provider, MD  bisacodyl (DULCOLAX) 10 MG suppository Place 10 mg rectally daily as needed for mild constipation.     Historical Provider, MD  CALCIUM PO Take 1 tablet by mouth once a week.     Historical Provider, MD  ferrous sulfate 325 (65 FE) MG tablet TAKE 1 TABLET BY MOUTH TWICE DAILY WITH MEALS TO KEEP BLOOD COUNT UP Patient taking differently: Take 325 mg by mouth 2 (two) times daily with a meal. TO KEEP BLOOD COUNT UP 08/26/16   Steve Rattler, DO  furosemide  (LASIX) 20 MG tablet Take 20 mg by mouth as directed. TAKE 1 TABLET BY MOUTH AS NEEDED FOR WEIGHT GAIN (2 LBS IN 24 HRS OR 5 LBS IN 1 WEEK)    Historical Provider, MD  nitroGLYCERIN (NITROSTAT) 0.4 MG SL tablet Place 1 tablet (0.4 mg total) under the tongue every 5 (five) minutes as needed for chest pain. 01/18/15   Eileen Stanford, PA-C  omeprazole (PRILOSEC) 20 MG capsule Take 1 capsule (20 mg total) by mouth daily. 08/26/16   Steve Rattler, DO  polyethylene glycol (MIRALAX / GLYCOLAX) packet Take 17 g by mouth daily. 07/26/16   Domenic Polite, MD  rosuvastatin (CRESTOR) 20 MG tablet Take 1 tablet (20 mg total) by mouth daily. 08/26/16   Steve Rattler, DO  senna (  SENOKOT) 8.6 MG tablet Take 1 tablet by mouth at bedtime as needed for constipation.     Historical Provider, MD  trolamine salicylate (ASPERCREME) 10 % cream Apply 1 application topically 2 (two) times daily as needed for muscle pain.     Historical Provider, MD    Family History Family History  Problem Relation Age of Onset  . Hypertension Mother   . Stroke Mother   . Heart disease Mother   . Aneurysm Mother   . CVA Mother   . Hypertension Father   . Stroke Father   . Heart attack Father   . Alcohol abuse Father   . Hypertension Sister   . Hypertension Brother   . Colon cancer Brother   . Diabetes Sister   . Diabetes Brother   . Cancer Brother   . Heart attack Brother   . Heart disease Sister   . Alcohol abuse Brother     Social History Social History  Substance Use Topics  . Smoking status: Former Smoker    Packs/day: 0.10    Years: 50.00    Types: Cigarettes    Quit date: 08/16/2011  . Smokeless tobacco: Never Used     Comment: quit 2012  . Alcohol use 0.0 oz/week     Comment: quit 08/16/2011     Allergies   Ace inhibitors and Propofol   Review of Systems Review of Systems  Constitutional: Positive for diaphoresis. Negative for chills and fever.  HENT: Negative for facial swelling.   Respiratory:  Positive for cough, shortness of breath, wheezing and stridor.   Cardiovascular: Positive for chest pain. Negative for palpitations and leg swelling.  Gastrointestinal: Negative for abdominal pain, constipation, diarrhea, nausea and vomiting.  Musculoskeletal: Negative for back pain, myalgias and neck pain.  Skin: Negative for rash and wound.  Neurological: Negative for dizziness, weakness, light-headedness, numbness and headaches.  All other systems reviewed and are negative.    Physical Exam Updated Vital Signs BP 99/60   Pulse 75   Resp 16   Ht 5\' 4"  (1.626 m)   Wt 161 lb (73 kg)   SpO2 100%   BMI 27.64 kg/m   Physical Exam  Constitutional: She is oriented to person, place, and time. She appears well-developed and well-nourished. She appears distressed.  HENT:  Head: Normocephalic and atraumatic.  Mouth/Throat: Oropharynx is clear and moist. No oropharyngeal exudate.  No obvious intraoral swelling.  Eyes: EOM are normal. Pupils are equal, round, and reactive to light.  Neck: Normal range of motion. Neck supple.  Cardiovascular: Normal rate and regular rhythm.  Exam reveals no gallop and no friction rub.   No murmur heard. Pulmonary/Chest: Stridor (pronounced stridor) present. She is in respiratory distress. She has wheezes.  Patient with expiratory wheezing. Increased work of breathing.  Abdominal: Soft. Bowel sounds are normal. There is no tenderness. There is no rebound and no guarding.  Musculoskeletal: Normal range of motion. She exhibits no edema or tenderness.  No new lower extremity swelling, asymmetry or tenderness.  Lymphadenopathy:    She has no cervical adenopathy.  Neurological: She is alert and oriented to person, place, and time.  Awake and alert. Unable to speak in full sentences. Moving all extremities without focal deficit. Sensation is fully intact.  Skin: Skin is warm. Capillary refill takes less than 2 seconds. No rash noted. No erythema.  Psychiatric:  She has a normal mood and affect. Her behavior is normal.  Nursing note and vitals reviewed.  ED Treatments / Results  Labs (all labs ordered are listed, but only abnormal results are displayed) Labs Reviewed  CBC WITH DIFFERENTIAL/PLATELET - Abnormal; Notable for the following:       Result Value   WBC 12.9 (*)    Hemoglobin 10.5 (*)    HCT 34.4 (*)    Lymphs Abs 5.0 (*)    Basophils Absolute 0.3 (*)    All other components within normal limits  COMPREHENSIVE METABOLIC PANEL - Abnormal; Notable for the following:    Potassium 2.4 (*)    Chloride 93 (*)    Glucose, Bld 180 (*)    Creatinine, Ser 2.07 (*)    Albumin 3.4 (*)    ALT 9 (*)    GFR calc non Af Amer 23 (*)    GFR calc Af Amer 26 (*)    All other components within normal limits  I-STAT TROPOININ, ED - Abnormal; Notable for the following:    Troponin i, poc 0.57 (*)    All other components within normal limits  MAGNESIUM    EKG  EKG Interpretation  Date/Time:  Wednesday October 15 2016 10:36:20 EDT Ventricular Rate:  70 PR Interval:    QRS Duration: 87 QT Interval:  348 QTC Calculation: 376 R Axis:   -90 Text Interpretation:  Sinus rhythm Atrial premature complexes Biatrial enlargement LVH with secondary repolarization abnormality Inferoposterior infarct, acute (RCA) Anterolateral infarct, acute Probable RV involvement, suggest recording right precordial leads Confirmed by Graylyn Bunney  MD, Natiya Seelinger ([id number]) on 10/15/2016 1:15:05 PM       Radiology Dg Chest Portable 1 View  Result Date: 10/15/2016 CLINICAL DATA:  Intubation. Asthma. Congestive heart failure. Atrial fibrillation. EXAM: PORTABLE CHEST 1 VIEW COMPARISON:  10/15/2016 FINDINGS: Endotracheal tube tip 3.5 cm above the carina. Nasogastric tube enters the stomach. Dual lead pacer noted. Mild enlargement of the cardiopericardial silhouette. Upper zone pulmonary vascular prominence could be from supine positioning. Linear bandlike density in the left  retrocardiac region potentially from atelectasis. IMPRESSION: 1. Tubes and lines satisfactorily position. 2. Mild enlargement of the cardiopericardial silhouette, without overt edema. 3. Left retrocardiac subsegmental atelectasis. Electronically Signed   By: Van Clines M.D.   On: 10/15/2016 13:03   Dg Chest Portable 1 View  Result Date: 10/15/2016 CLINICAL DATA:  Respiratory distress, shortness of Breath EXAM: PORTABLE CHEST 1 VIEW COMPARISON:  08/17/2016 FINDINGS: Right pacer remains in place, unchanged. Cardiomegaly. No confluent opacities or effusions. No acute bony abnormality. IMPRESSION: Cardiomegaly.  No active disease. Electronically Signed   By: Rolm Baptise M.D.   On: 10/15/2016 12:13    Procedures Procedure Name: Intubation Date/Time: 10/15/2016 1:19 PM Performed by: Lita Mains, Amando Ishikawa Pre-anesthesia Checklist: Patient identified, Suction available, Emergency Drugs available and Patient being monitored Oxygen Delivery Method: Simple face mask Preoxygenation: Pre-oxygenation with 100% oxygen Intubation Type: IV induction, Rapid sequence and Cricoid Pressure applied Ventilation: Mask ventilation without difficulty Laryngoscope Size: Glidescope and 3 Tube type: Subglottic suction tube Tube size: 7.0 mm Number of attempts: 1 Airway Equipment and Method: Patient positioned with wedge pillow and Video-laryngoscopy Placement Confirmation: ETT inserted through vocal cords under direct vision,  Positive ETCO2,  CO2 detector and Breath sounds checked- equal and bilateral Secured at: 23 cm Tube secured with: ETT holder Difficulty Due To: Difficulty was anticipated and Difficult Airway-due to vocal cord/laryngeal edema      (including critical care time)  Medications Ordered in ED Medications  LORazepam (ATIVAN) injection 0.5 mg (0.5 mg Intravenous Not Given 10/15/16 1057)  potassium chloride 10 mEq in 100 mL IVPB (10 mEq Intravenous New Bag/Given 10/15/16 1241)  fentaNYL 2549mcg  in NS 286mL (62mcg/ml) infusion-PREMIX (50 mcg/hr Intravenous New Bag/Given 10/15/16 1236)  fentaNYL (SUBLIMAZE) bolus via infusion 25 mcg (not administered)  midazolam (VERSED) 50 mg in sodium chloride 0.9 % 50 mL (1 mg/mL) infusion (3 mg/hr Intravenous Rate/Dose Change 10/15/16 1321)  LORazepam (ATIVAN) 2 MG/ML injection (0.5 mg  Given 10/15/16 1052)  albuterol (PROVENTIL,VENTOLIN) solution continuous neb (10 mg/hr Nebulization Given 10/15/16 1105)  LORazepam (ATIVAN) injection 0.5 mg (0.5 mg Intravenous Given 10/15/16 1119)  fentaNYL (SUBLIMAZE) injection 50 mcg (50 mcg Intravenous Bolus 10/15/16 1236)  midazolam (VERSED) 2 MG/2ML injection (2 mg  Given 10/15/16 1300)  etomidate (AMIDATE) injection (20 mg Intravenous Given 10/15/16 1223)  succinylcholine (ANECTINE) injection (100 mg Intravenous Given 10/15/16 1223)   CRITICAL CARE Performed by: Lita Mains, Dawt Reeb Total critical care time: 50 minutes Critical care time was exclusive of separately billable procedures and treating other patients. Critical care was necessary to treat or prevent imminent or life-threatening deterioration. Critical care was time spent personally by me on the following activities: development of treatment plan with patient and/or surrogate as well as nursing, discussions with consultants, evaluation of patient's response to treatment, examination of patient, obtaining history from patient or surrogate, ordering and performing treatments and interventions, ordering and review of laboratory studies, ordering and review of radiographic studies, pulse oximetry and re-evaluation of patient's condition.  Initial Impression / Assessment and Plan / ED Course  I have reviewed the triage vital signs and the nursing notes.  Pertinent labs & imaging results that were available during my care of the patient were reviewed by me and considered in my medical decision making (see chart for details).  Clinical Course  Patient initially  improved with continuous nebulized treatment and Ativan. She then became increasingly stridorous, tripoding with increasing work to breathe. She became more diaphoretic. Moved to trauma room with airway cart at bedside. Discussed with Dr. Warren Danes. Decision made to intubate. Discussed with both the patient and her son. Given dose of Versed prior and R side drugs. Cords were easily visualized though there was some difficulty passing a 7.0 ET tube through the cords to to presumed subglottic stricture. No hypoxia. Bilateral breath sounds noted. Patient was placed on sedation with IV fentanyl and Versed drips. Discuss with critical care team who will see the patient in the emergency department and admit. Also discussed with cardiology regarding elevation in troponin.   Final Clinical Impressions(s) / ED Diagnoses   Final diagnoses:  Respiratory distress  Laryngeal stridor  COPD exacerbation (HCC)    New Prescriptions New Prescriptions   No medications on file     Julianne Rice, MD 10/15/16 1321

## 2016-10-15 NOTE — ED Notes (Signed)
Critical care MD at bedside 

## 2016-10-15 NOTE — ED Notes (Signed)
Critical care at bedside  

## 2016-10-15 NOTE — Progress Notes (Signed)
PCCM Attending Note: Contacted by RT after ABG obtained. Patient's ABG reviewed:  PH 7.62 / 30 / 141 / Sat 100%. Decreased RR to 12 & TV to 6cc/kg IBW. Repeat ABG @ 1700.   Sonia Baller Ashok Cordia, M.D. [facility] Pulmonary & Critical Care Pager:  [phone] After 3pm or if no response, call [phone] 3:58 PM 10/15/16

## 2016-10-15 NOTE — ED Notes (Signed)
Critical care MD paged and made aware trop 0.93

## 2016-10-15 NOTE — ED Notes (Signed)
Pt moved to trauma A after failed attempts with albuterol and steroids. Pt has stridor and wheezing throughout lung fields. Pt is alert and ox4 at this time. Agreeable to intubation to maintain airway.

## 2016-10-15 NOTE — Progress Notes (Signed)
Pts set of earrings sent home with [name], pts daughter.

## 2016-10-15 NOTE — ED Triage Notes (Addendum)
Pt in from home via [facility] with respiratory distress, stridor and rhonchi on EMS arrival. Pt recent hx of cardiac arrest after propofol, required intubation, vocal chords damaged in process. S/p intubation, pt has been having frequent episodes of dyspnea since. Given Epi Neb, 10mg  Albuterol, 0.5mg  Atrovent, 125mg  Solumedrol by EMS. Pt arrives a&ox4, on 5L mask, rhonchi, wheezing present all fields

## 2016-10-15 NOTE — Consult Note (Signed)
Otolaryngology Clinic Note  HPI:    [name] is a 70 y.o. female patient of Candice Camp, MD for reevaluation of difficulty breathing.  She is now 3 weeks status post laryngoscopy with removal of granulation tissue in the posterior commissure.  This was benign by pathology.  Unfortunately, she has continued to have stridor and some difficulty breathing.  This may have been slightly better immediately postop, and now is getting gradually worse.  She does not feel like her pulmonary inhalers are helping at all.  She is using Prilosec for reflux first thing in the morning, although most of her symptoms are nocturnal. PMH/Meds/All/SocHx/FamHx/ROS:   Past Medical History      Past Medical History:  Diagnosis Date  . Allergy   . Congestive heart failure (CHF) ([place])   . Hypercholesterolemia   . Hypertension       Past Surgical History       Past Surgical History:  Procedure Laterality Date  . CARDIAC PACEMAKER PLACEMENT        No family history of bleeding disorders, wound healing problems or difficulty with anesthesia.   Social History  Social History        Social History  . Marital status: Divorced    Spouse name: N/A  . Number of children: N/A  . Years of education: N/A      Occupational History  . Not on file.       Social History Main Topics  . Smoking status: Former Research scientist (life sciences)  . Smokeless tobacco: Not on file  . Alcohol use No  . Drug use: Unknown  . Sexual activity: Not on file       Other Topics Concern  . Not on file      Social History Narrative  . No narrative on file       Current Outpatient Prescriptions:  .  acetaminophen (TYLENOL) 325 MG tablet, Take by mouth., Disp: , Rfl:  .  albuterol 90 mcg/actuation inhaler, Inhale 2 puffs into the lungs., Disp: , Rfl:  .  ALPRAZolam (XANAX) 0.5 MG tablet, TAKE 1 TABLET BY MOUTH DAILY AS NEEDED FOR ANXIETY OR SLEEP, Disp: , Rfl:  .  aspirin 81 MG EC tablet *ANTIPLATELET*,  Take by mouth., Disp: , Rfl:  .  bisacodyl (DULCOLAX) 10 mg suppository, Place rectally., Disp: , Rfl:  .  calcium carbonate-vitamin D3 (CALCIUM 600 WITH VITAMIN D3) 600 mg(1,500mg ) -200 unit Tab, Take 1 tablet by mouth., Disp: , Rfl:  .  coenzyme Q10 200 mg capsule, Take 1 capsule by mouth., Disp: , Rfl:  .  furosemide (LASIX) 20 MG tablet, Take by mouth., Disp: , Rfl:  .  nitroglycerin (NITROSTAT) 0.4 MG SL tablet, Place under the tongue., Disp: , Rfl:  .  omeprazole (PRILOSEC) 20 MG capsule, Take by mouth., Disp: , Rfl:  .  polyethylene glycol (MIRALAX) 17 gram packet, Take by mouth., Disp: , Rfl:  .  rosuvastatin (CRESTOR) 20 MG tablet, TAKE 1 TABLET BY MOUTH DAILY ON MONDAY, WEDNESDAY, AND FRIDAY, Disp: , Rfl:  .  senna (SENOKOT) 8.6 mg tablet, Take 1 tablet by mouth., Disp: , Rfl:  .  trolamine salicylate (ASPERCREME) 10 % cream, Apply topically., Disp: , Rfl:   A complete ROS was performed with pertinent positives/negatives noted in the HPI. The remainder of the ROS are negative.    Physical Exam:    There were no vitals taken for this visit. She is anxious and audibly stridorous.  She does not  seem terribly labored.  Her voice is hoarse.  Ears are clear.  Anterior nose is clear.  Oral cavity and pharynx clear.  Neck unremarkable.  Using the flexible laryngoscope, the nasopharynx is clear.  Oropharynx clear.  Hypopharynx/larynx shows some limited mobility posteriorly and some soft tissue underneath the cords posteriorly.    Flexible Laryngoscopy   Indications were discussed.  Details of the procedure were explained.  Questions were answered and informed consent was obtained verbally.    Technique:  After anesthetizing the nasal cavity with topical lidocaine and oxymetazoline, the flexible endoscope was introduced and passed through the right nasal cavity into the nasopharynx. The nasopharynx was inspected with normal eustachian tori.  Oropharynx normal with minimal  lingual tonsils and no lesions.  Hypopharynx/Larynx shows mobile vocal cords with good airway.  No pooling in valleculae or pyriforms.    The scope was withdrawn from the nose. She tolerated the procedure well.     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  Plan: If she was simply noisy, she could live like this.  If she is feeling dyspnea, then we should attempt to do something.  She is not interested in having anesthesia again if at all possible.  I would propose doing a laryngoscopy, possibly with jet ventilation, and either use a laser to open the posterior commissure, or possibly dilate this area.  I would use a Kenalog injection in either case to discourage scar re-formation.  She would like to think on this before making a decision.  Events of 1 NOV reviewed.  Will place on schedule tomorrow for direct laryngoscopy, Laser ablation posterior commissure stenosis, Kenalog injection. Will need clearance from Cardiology.   I will stop by this evening or in the AM to discuss with her.    Lilyan Gilford, MD  [id number]

## 2016-10-15 NOTE — ED Notes (Signed)
cardiology at bedside

## 2016-10-15 NOTE — Consult Note (Signed)
CARDIOLOGY CONSULT NOTE   Patient ID: [name] MRN: [id number] DOB/AGE: March 04, 1946 70 y.o.  Admit date: 10/15/2016  Primary Physician   Steve Rattler, DO Primary Cardiologist   [name] Electrophysiologist: Thompson Grayer, MD Reason for Consultation   Elevated troponin Requesting Physician  Dr. Lita Mains  HPI: [name] is a 70 y.o. female with a history of hypertension, hyperlipidemia, CKD III, chronic atrial fibrillation, (not anticoagulated due to GI bleed in  2012 with known AVMs), chronic diastolic heart failure, complete heart block status post pacemaker ([employer]), hypertrophic cardiomyopathy, and COPD who presented by EMS for shortness of breath.   She had been admitted to the hospital in December 2016  with a complaint of chest pain and was noted to be mildly volume overloaded. Her BNP was 436 and echo revealed normal systolic function with grade 2 diastolic dysfunction. She was diuresed with IV Lasix and developed acute renal failure. Troponin was mildly elevated to 0.19 Lexiscan Cardiolite was negative for ischemia.   She was referred for consideration of a Watchman device 03/7095, which was complicated by acute respiratory distress during the TEE that required intubation.  She had an acute drip in biventricular failure that eventually normalized.  She was discharged to a SNF 07/26/16.  Admitted 08/2016 with acute on chronic diastolic CHF. Troponin of 0.04. Cath showed normal coronaries, to mod pulmonary HTN, prominent v-waves suggestive of severe DD or significant MR, normal LV function. Optimized diuretics.   She has struggled with laryngeal stenosis s/p removal of granulation tissue in the posterior subglottis bridging from side to side 09/18/16.  She was euvolemic when last seen by [name] in clinic 09/24/16. Off BB due to hypotension.  He brought by EMS today with respiratory distress, stridor and rhonchi. No improvement with epi  nebulizers/albuterol and Solu-Medrol. Now intubated. History obtained from Reviewing Chart. No Family at Bedside. Point-of-care troponin 0.57. Potassium of 2.4. Serum creatinine 2.07. Albumin 3.4. WBC 12.9. Hemoglobin 10.5. Chest x-ray shows cardiomegaly without acute abnormality. EKG shows pacer rhythm.  Past Medical History:  Diagnosis Date  . Anemia   . Angina   . Angioedema    2/2 ACE  . Arteriovenous malformation of stomach   . Arthritis   . Asthma   . AVM (arteriovenous malformation) of colon    small intestine; stomach  . Blood transfusion   . CHF (congestive heart failure) ([place])   . Complete heart block ([place])    s/p PPM 1998  . Complication of anesthesia    Difficult airway; anaphylaxis and swelling with propofol  . DDD (degenerative disc disease)   . Depression   . Diastolic heart failure   . Fatty liver 07/26/10  . GERD (gastroesophageal reflux disease)   . GI bleed   . Headache   . History of alcohol abuse Stopped Fall 2012  . History of tobacco use Quit Fall 2012  . Hx of cardiovascular stress test    a. Lexiscan Myoview (10/15):  Small inferolateral and apical defect c/w scar and poss soft tissue attenuation, no ischemia, EF 42%  . Hx of colonic polyp 08/13/10   adenomatous  . Hx of colonoscopy   . Hyperlipidemia   . Hyperlipidemia   . Hypertension   . Hypertrophic cardiomyopathy ([place])    dx by Dr [name] 2009  . Iron deficiency anemia   . Panic attack   . Panic attacks   . Permanent atrial fibrillation ([place])   . Pneumonia   . Renal failure  baseline creatinine 1.6  . Right arm pain 01/08/2012  . RLS (restless legs syndrome)    Dx 06/2007  . Shortness of breath    sob on exertation  . Sleep apnea      Past Surgical History:  Procedure Laterality Date  . CARDIAC CATHETERIZATION    . CARDIAC CATHETERIZATION N/A 08/19/2016   Procedure: Right/Left Heart Cath and Coronary Angiography;  Surgeon: Jolaine Artist, MD;  Location: [facility];   Service: Cardiovascular;  Laterality: N/A;  . DIRECT LARYNGOSCOPY N/A 09/18/2016   Procedure: DIRECT LARYNGOSCOPY, BRONCHOSCOPY, REMOVAL OF INTUBATION GRANULOMA;  Surgeon: [name], MD;  Location: [facility] OR;  Service: ENT;  Laterality: N/A;  . ESOPHAGOGASTRODUODENOSCOPY  12/23/2011   Procedure: ESOPHAGOGASTRODUODENOSCOPY (EGD);  Surgeon: [name], MD;  Location: [facility];  Service: Endoscopy;  Laterality: N/A;  . EXTUBATION (ENDOTRACHEAL) IN OR N/A 07/21/2016   Procedure: EXTUBATION (ENDOTRACHEAL) IN OR;  Surgeon: [name], MD;  Location: [facility];  Service: ENT;  Laterality: N/A;  . GIVENS CAPSULE STUDY  12/23/2011   Procedure: GIVENS CAPSULE STUDY;  Surgeon: [name], MD;  Location: [facility];  Service: Endoscopy;  Laterality: N/A;  . PACEMAKER INSERTION  1998   st jude, most recent gen change by [name] 4/12  . TUBAL LIGATION  04/01/2000    Allergies  Allergen Reactions  . Ace Inhibitors Other (See Comments)    Angioedema  . Propofol Anaphylaxis and Swelling    I have reviewed the patient's current medications . LORazepam  0.5 mg Intravenous Once  . midazolam       . fentaNYL infusion INTRAVENOUS 50 mcg/hr (10/15/16 1236)  . midazolam (VERSED) infusion 2 mg/hr (10/15/16 1237)  . potassium chloride 10 mEq (10/15/16 1241)   fentaNYL  Prior to Admission medications   Medication Sig Start Date End Date Taking? Authorizing Provider  acetaminophen (TYLENOL) 325 MG tablet Take 325 mg by mouth daily as needed (pain).    Historical Provider, MD  albuterol (PROVENTIL) (2.5 MG/3ML) 0.083% nebulizer solution Take 3 mLs (2.5 mg total) by nebulization every 6 (six) hours as needed for wheezing or shortness of breath. 08/11/16   Dinah C Ngetich, NP  ALPRAZolam (XANAX) 0.5 MG tablet Take 1 tablet (0.5 mg total) by mouth at bedtime as needed for anxiety. 07/26/16   Domenic Polite, MD  aspirin EC 81 MG tablet Take 81 mg by mouth every morning.     Historical Provider, MD  bisacodyl  (DULCOLAX) 10 MG suppository Place 10 mg rectally daily as needed for mild constipation.     Historical Provider, MD  CALCIUM PO Take 1 tablet by mouth once a week.     Historical Provider, MD  ferrous sulfate 325 (65 FE) MG tablet TAKE 1 TABLET BY MOUTH TWICE DAILY WITH MEALS TO KEEP BLOOD COUNT UP Patient taking differently: Take 325 mg by mouth 2 (two) times daily with a meal. TO KEEP BLOOD COUNT UP 08/26/16   Steve Rattler, DO  furosemide (LASIX) 20 MG tablet Take 20 mg by mouth as directed. TAKE 1 TABLET BY MOUTH AS NEEDED FOR WEIGHT GAIN (2 LBS IN 24 HRS OR 5 LBS IN 1 WEEK)    Historical Provider, MD  nitroGLYCERIN (NITROSTAT) 0.4 MG SL tablet Place 1 tablet (0.4 mg total) under the tongue every 5 (five) minutes as needed for chest pain. 01/18/15   Eileen Stanford, PA-C  omeprazole (PRILOSEC) 20 MG capsule Take 1 capsule (20 mg total) by mouth daily.  08/26/16   Steve Rattler, DO  polyethylene glycol (MIRALAX / GLYCOLAX) packet Take 17 g by mouth daily. 07/26/16   Domenic Polite, MD  rosuvastatin (CRESTOR) 20 MG tablet Take 1 tablet (20 mg total) by mouth daily. 08/26/16   Steve Rattler, DO  senna (SENOKOT) 8.6 MG tablet Take 1 tablet by mouth at bedtime as needed for constipation.     Historical Provider, MD  trolamine salicylate (ASPERCREME) 10 % cream Apply 1 application topically 2 (two) times daily as needed for muscle pain.     Historical Provider, MD     Social History   Social History  . Marital status: Divorced    Spouse name: N/A  . Number of children: 4  . Years of education: 9   Occupational History  . disabled Unemployed  . previously- house cleaning    Social History Main Topics  . Smoking status: Former Smoker    Packs/day: 0.10    Years: 50.00    Types: Cigarettes    Quit date: 08/16/2011  . Smokeless tobacco: Never Used     Comment: quit 2012  . Alcohol use 0.0 oz/week     Comment: quit 08/16/2011  . Drug use: No  . Sexual activity: No   Other Topics Concern   . Not on file   Social History Narrative   On disability for heart failure/pacemaker.     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    4 grown children,  8 grandchildren.     Drinks alcohol a few times a week socially. At one time, the most she reports drinking is about 3 mixed drinks.   Lives with daughter [name] and son. Will be moving 04/2011 to different house because concerned children are taking advantage of her financial situation.    No illicit drugs.   Smokes few cigarettes daily, especially when stressed. Started smoking @ age 33. Quit January 2012 2/2 health but started smoking again 02/2011.       Health Care POA:    Emergency Contact: daughter, Verdene Lennert [phone]   End of Life Plan: gave pt AD info pamphlet   Who lives with you: no one- lives in section 8 housing   Any pets: none   Diet: Pt has a varied diet of protein, starch and vegetables   Exercise: Pt has no regular exercise routine.   Seatbelts: Pt reports wearing seatbelt when in vehicles.    Hobbies: dancing    Family Status  Relation Status  . Mother Deceased  . Father Deceased  . Sister   . Brother   . Sister   . Brother   . Brother   . Brother   . Sister   . Brother    Family History  Problem Relation Age of Onset  . Hypertension Mother   . Stroke Mother   . Heart disease Mother   . Aneurysm Mother   . CVA Mother   . Hypertension Father   . Stroke Father   . Heart attack Father   . Alcohol abuse Father   . Hypertension Sister   . Hypertension Brother   . Colon cancer Brother   . Diabetes Sister   . Diabetes Brother   . Cancer Brother   . Heart attack Brother   . Heart disease Sister   . Alcohol abuse Brother       ROS:  Full 14 point review of systems complete and found to be negative unless listed above.  Physical Exam: Blood pressure 140/74, pulse 74, resp. rate 13, height 5\' 6"  (1.676 m), weight 161 lb (73 kg), SpO2 100 %.  General: sedated and intubated Head:  Eyes PERRLA, No xanthomas. Normocephalic and atraumatic, oropharynx without edema or exudate.  Lungs: diminished breath sound with wheezing Heart: RRR no s3, s4, or murmurs Neck: No carotid bruits. No lymphadenopathy. No JVD. Abdomen: Bowel sounds present, abdomen soft and non-tender without masses or hernias noted. Msk:  No spine or cva tenderness. No weakness, no joint deformities or effusions. Extremities: No clubbing, cyanosis or edema. DP/PT/Radials 2+ and equal bilaterally. Neuro: Alert and oriented X 3. No focal deficits noted. Psych:  Good affect, responds appropriately Skin: No rashes or lesions noted.  Labs:   Lab Results  Component Value Date   WBC 12.9 (H) 10/15/2016   HGB 10.5 (L) 10/15/2016   HCT 34.4 (L) 10/15/2016   MCV 87.3 10/15/2016   PLT 363 10/15/2016   No results for input(s): INR in the last 72 hours.  Recent Labs Lab 10/15/16 1040  NA 140  K 2.4*  CL 93*  CO2 32  BUN 14  CREATININE 2.07*  CALCIUM 9.9  PROT 7.3  BILITOT 0.5  ALKPHOS 54  ALT 9*  AST 30  GLUCOSE 180*  ALBUMIN 3.4*   Magnesium  Date Value Ref Range Status  10/15/2016 2.0 1.7 - 2.4 mg/dL Final   No results for input(s): CKTOTAL, CKMB, TROPONINI in the last 72 hours.  Recent Labs  10/15/16 1112  TROPIPOC 0.57*   Pro B Natriuretic peptide (BNP)  Date/Time Value Ref Range Status  02/15/2015 03:02 PM 363.0 (H) 0.0 - 100.0 pg/mL Final  01/18/2015 03:38 PM 537.0 (H) 0.0 - 100.0 pg/mL Final   Lab Results  Component Value Date   CHOL 122 03/28/2015   HDL 42 (L) 03/28/2015   LDLCALC 63 03/28/2015   TRIG 86 03/28/2015   Right/Left Heart Cath and Coronary Angiography  08/19/16  Conclusion   Findings:  Ao = 145/79 (105) LV = 139/10/18 RA = 7 RV = 68/9 PA = 64/18 (38) PCW = 22 (v waves to 37) Fick cardiac output/index = 5.4/2.9 PVR = 3.0 WU Ao sat = 94% PA sat = 59%, 59%  Assessment: 1. Normal coronary arteries 2. Mild to moderate pulmonary HTN with normal PVR  (mostly left-sided)  3. Prominent v-waves in PCWP tracing suggestive of severe diastolic dysfunction or significant mitral regurgitation  4. Normal LV function and cardiac output  Plan/Discussion:  Continue medical therapy. Optimize diuretic regimen. Review echo to assess degree of MR.       Echo: 07/11/16 LV EF: 55% -   60%  ------------------------------------------------------------------- History:   PMH:  Hypertrophic Cardiomyopathy.  Atrial fibrillation.   ------------------------------------------------------------------- Study Conclusions  - Left ventricle: The cavity size was normal. Systolic function was   normal. The estimated ejection fraction was in the range of 55%   to 60%. There was moderate asymmetric septal hypertrophy.   Indeterminant diastolic function (atrial fibrillation). No   significant LV outflow tract gradient. Wall motion was normal;   there were no regional wall motion abnormalities. - Aortic valve: There was no stenosis. - Mitral valve: No mitral valve SAM. Mildly calcified annulus.   There was mild regurgitation. - Left atrium: The atrium was moderately dilated. - Right ventricle: The cavity size was mildly dilated. Pacer wire   or catheter noted in right ventricle. Systolic function was   moderately reduced. - Right atrium: The atrium was  mildly dilated. - Tricuspid valve: Peak RV-RA gradient (S): 47 mm Hg. - Pulmonary arteries: PA peak pressure: 62 mm Hg (S). - Systemic veins: IVC measured 2.3 cm with < 50% respirophasic   variation, suggesting RA pressure 15 mmHg.  Impressions:  - The patient was in atrial fibrillation. Normal LV size with   moderate asymmetric septal hypertrophy. No LVOT gradient or   mitral valve SAM. EF 55-60%. This could be consistent with   hypertrophic nonobstructive cardiomyopathy. There was mild MR.   Mildly dilated RV with moderately decreased systolic function.   Moderate pulmonary  hypertension.    Radiology:  Dg Chest Portable 1 View  Result Date: 10/15/2016 CLINICAL DATA:  Respiratory distress, shortness of Breath EXAM: PORTABLE CHEST 1 VIEW COMPARISON:  08/17/2016 FINDINGS: Right pacer remains in place, unchanged. Cardiomegaly. No confluent opacities or effusions. No acute bony abnormality. IMPRESSION: Cardiomegaly.  No active disease. Electronically Signed   By: Rolm Baptise M.D.   On: 10/15/2016 12:13    ASSESSMENT AND PLAN:     1. Acute respiratory failure requiring intubation - hx of chronic upper airway narrowing after recent intubation 7/17. Laryngeal stenosis s/p removal of granulation tissue in the posterior subglottis bridging from side to side 09/18/16. - Unknown etiology. CXR unremarkable.   2. Elevated Troponin - POC troponin of 0.57. Recent cath 9/17 showed normal coronaries. Continue cycle troponin. EKG showed afib with paced rhythm with ST depression in lead V1 and V2.    3. Chronic atrial fibrillation and CHB s/p PPM - managed by Dr. Rayann Heman. CHADSVASCs score of 4. She is not a candidate for anticoagulation due to GI bleed and and AVMs.  She is not a candidate for a Watchman due to complications of the screening TEE.   4. Chronic diastolic CHF, HCM - echo 5/64 showed LV EF of 55-60%,  hypertrophic nonobstructive cardiomyopathy, PA pressure of 62 mm HG.  - Recent cath report 08/2016 as above. Does not seems in acute CHF.   SignedLeanor Kail, [place] 10/15/2016, 1:01 PM Pager [phone]  Co-Sign MD

## 2016-10-15 NOTE — H&P (Signed)
PULMONARY / CRITICAL CARE MEDICINE   Name: [name] MRN: [id number] DOB: 03-14-1946    ADMISSION DATE:  10/15/2016  REFERRING MD:  Lita Mains - EDP.  CHIEF COMPLAINT:  Dyspnea, stridor   HISTORY OF PRESENT ILLNESS:   70yo female with multiple medical problems including dCHF, GERD, hx ETOH, AFib, CKD, angioedema with chronic upper airway narrowing after an intubation with recent laryngoscopy by ENT with removal of granulation tissue 3 weeks prior to admit but has continued to have dyspnea and stridor.  She presented 11/1 to ER with ongoing dyspnea and stridor.  She initially improved with steroids, BD and ativan and was seen by ENT who scheduled direct laryngoscopy for 11/2 but wanted cardiology clearance first r/t troponin 0.57.  Over time she became increasingly stridorous, tripoding, diaphoretic and required intubation in ER. PCCM called to admit.   PAST MEDICAL HISTORY :  She  has a past medical history of Anemia; Angina; Angioedema; Arteriovenous malformation of stomach; Arthritis; Asthma; AVM (arteriovenous malformation) of colon; Blood transfusion; CHF (congestive heart failure) ([place]); Complete heart block ([place]); Complication of anesthesia; DDD (degenerative disc disease); Depression; Diastolic heart failure; Fatty liver (07/26/10); GERD (gastroesophageal reflux disease); GI bleed; Headache; History of alcohol abuse (Stopped Fall 2012); History of tobacco use (Quit Fall 2012); cardiovascular stress test; colonic polyp (08/13/10); colonoscopy; Hyperlipidemia; Hyperlipidemia; Hypertension; Hypertrophic cardiomyopathy ([place]); Iron deficiency anemia; Panic attack; Panic attacks; Permanent atrial fibrillation ([place]); Pneumonia; Renal failure; Right arm pain (01/08/2012); RLS (restless legs syndrome); Shortness of breath; and Sleep apnea.  PAST SURGICAL HISTORY: She  has a past surgical history that includes Tubal ligation (04/01/2000); Pacemaker insertion (1998); Cardiac catheterization;  Esophagogastroduodenoscopy (12/23/2011); Givens capsule study (12/23/2011); Extubation (endotracheal) in or (N/A, 07/21/2016); Cardiac catheterization (N/A, 08/19/2016); and Direct laryngoscopy (N/A, 09/18/2016).  Allergies  Allergen Reactions  . Ace Inhibitors Other (See Comments)    Angioedema  . Propofol Anaphylaxis and Swelling    No current facility-administered medications on file prior to encounter.    Current Outpatient Prescriptions on File Prior to Encounter  Medication Sig  . acetaminophen (TYLENOL) 325 MG tablet Take 325 mg by mouth daily as needed (pain).  [name] albuterol (PROVENTIL) (2.5 MG/3ML) 0.083% nebulizer solution Take 3 mLs (2.5 mg total) by nebulization every 6 (six) hours as needed for wheezing or shortness of breath.  . ALPRAZolam (XANAX) 0.5 MG tablet Take 1 tablet (0.5 mg total) by mouth at bedtime as needed for anxiety.  [name] aspirin EC 81 MG tablet Take 81 mg by mouth every morning.   . bisacodyl (DULCOLAX) 10 MG suppository Place 10 mg rectally daily as needed for mild constipation.   [name] CALCIUM PO Take 1 tablet by mouth once a week.   . ferrous sulfate 325 (65 FE) MG tablet TAKE 1 TABLET BY MOUTH TWICE DAILY WITH MEALS TO KEEP BLOOD COUNT UP (Patient taking differently: Take 325 mg by mouth 2 (two) times daily with a meal. TO KEEP BLOOD COUNT UP)  . furosemide (LASIX) 20 MG tablet Take 20 mg by mouth as directed. TAKE 1 TABLET BY MOUTH AS NEEDED FOR WEIGHT GAIN (2 LBS IN 24 HRS OR 5 LBS IN 1 WEEK)  . nitroGLYCERIN (NITROSTAT) 0.4 MG SL tablet Place 1 tablet (0.4 mg total) under the tongue every 5 (five) minutes as needed for chest pain.  [name] omeprazole (PRILOSEC) 20 MG capsule Take 1 capsule (20 mg total) by mouth daily.  . polyethylene glycol (MIRALAX / GLYCOLAX) packet Take 17 g by mouth daily.  . rosuvastatin (  CRESTOR) 20 MG tablet Take 1 tablet (20 mg total) by mouth daily.  [name] senna (SENOKOT) 8.6 MG tablet Take 1 tablet by mouth at bedtime as needed for constipation.   .  trolamine salicylate (ASPERCREME) 10 % cream Apply 1 application topically 2 (two) times daily as needed for muscle pain.     FAMILY HISTORY:  Her indicated that her mother is deceased. She indicated that her father is deceased.    SOCIAL HISTORY: She  reports that she quit smoking about 5 years ago. Her smoking use included Cigarettes. She has a 5.00 pack-year smoking history. She has never used smokeless tobacco. She reports that she drinks alcohol. She reports that she does not use drugs.  REVIEW OF SYSTEMS:   Unable, sedated on vent.  As per HPI above obtained from records and family.   SUBJECTIVE:   On vent, agitated.  VITAL SIGNS: BP 121/74   Pulse 74   Resp 16   Ht 5\' 4"  (1.626 m)   Wt 161 lb (73 kg)   SpO2 100%   BMI 27.64 kg/m   HEMODYNAMICS:    VENTILATOR SETTINGS: Vent Mode: PRVC FiO2 (%):  [100 %] 100 % Set Rate:  [16 bmp] 16 bmp Vt Set:  [460 mL] 460 mL PEEP:  [5 cmH20] 5 cmH20 Plateau Pressure:  [14 cmH20] 14 cmH20  INTAKE / OUTPUT: No intake/output data recorded.  PHYSICAL EXAMINATION: General: AA female, agitated on vent. Neuro: On vent, awake, agitated, MAE's. HEENT: [place]/AT. PERRL, sclerae anicteric. Cardiovascular: RRR, no M/R/G.  Lungs: Respirations even and unlabored.  CTA bilaterally, No W/R/R. No stridor. Abdomen: BS x 4, soft, NT/ND.  Musculoskeletal: No gross deformities, no edema.  Skin: Intact, warm, no rashes.   LABS:  BMET  Recent Labs Lab 10/15/16 1040  NA 140  K 2.4*  CL 93*  CO2 32  BUN 14  CREATININE 2.07*  GLUCOSE 180*    Electrolytes  Recent Labs Lab 10/15/16 1040 10/15/16 1207  CALCIUM 9.9  --   MG  --  2.0    CBC  Recent Labs Lab 10/15/16 1040  WBC 12.9*  HGB 10.5*  HCT 34.4*  PLT 363    Coag's No results for input(s): APTT, INR in the last 168 hours.  Sepsis Markers No results for input(s): LATICACIDVEN, PROCALCITON, O2SATVEN in the last 168 hours.  ABG  Recent Labs Lab 10/15/16 1331   PHART 7.431  PCO2ART 52.4*  PO2ART 35.0*    Liver Enzymes  Recent Labs Lab 10/15/16 1040  AST 30  ALT 9*  ALKPHOS 54  BILITOT 0.5  ALBUMIN 3.4*    Cardiac Enzymes No results for input(s): TROPONINI, PROBNP in the last 168 hours.  Glucose No results for input(s): GLUCAP in the last 168 hours.  Imaging Dg Chest Portable 1 View  Result Date: 10/15/2016 CLINICAL DATA:  Intubation. Asthma. Congestive heart failure. Atrial fibrillation. EXAM: PORTABLE CHEST 1 VIEW COMPARISON:  10/15/2016 FINDINGS: Endotracheal tube tip 3.5 cm above the carina. Nasogastric tube enters the stomach. Dual lead pacer noted. Mild enlargement of the cardiopericardial silhouette. Upper zone pulmonary vascular prominence could be from supine positioning. Linear bandlike density in the left retrocardiac region potentially from atelectasis. IMPRESSION: 1. Tubes and lines satisfactorily position. 2. Mild enlargement of the cardiopericardial silhouette, without overt edema. 3. Left retrocardiac subsegmental atelectasis. Electronically Signed   By: Van Clines M.D.   On: 10/15/2016 13:03   Dg Chest Portable 1 View  Result Date: 10/15/2016 CLINICAL DATA:  Respiratory distress, shortness of Breath EXAM: PORTABLE CHEST 1 VIEW COMPARISON:  08/17/2016 FINDINGS: Right pacer remains in place, unchanged. Cardiomegaly. No confluent opacities or effusions. No acute bony abnormality. IMPRESSION: Cardiomegaly.  No active disease. Electronically Signed   By: Rolm Baptise M.D.   On: 10/15/2016 12:13    STUDIES:  CXR 11/1 >  ETT in good position.  CULTURES: None.  ANTIBIOTICS: None.  SIGNIFICANT EVENTS: 11/1 > admitted with stridor / respiratory distress requiring intubation.  LINES/TUBES: ETT 11/1 >  DISCUSSION: 70 y.o. female with hx laryngeal stenosis following intubation prior admit, now admitted with stridor and respiratory distress requiring intubation.  ASSESSMENT / PLAN:  PULMONARY A: Acute  hypoxic respiratory failure - due to stridor from laryngeal stenosis following intubation on prior admit. Hx OSA. Former smoker. P:   Full vent support. Wean as able. VAP prevention measures. SBT only after ENT procedures performed. May eventually require trach. Albuterol PRN. Decadron scheduled. ENT following, planning on direct laryngoscopy with intervention tomorrow 11/2. CXR in AM.  CARDIOVASCULAR A:  Troponin leak - consider demand. dCHF - RHC and LHC 08/19/16 with normal coronary arteries, severe diastolic dysfunction or significant MR, normal LV. Hx HCM, HTN, HLD P:  Trend troponin. Cardiology consulted by EDP. Continue preadmission ASA, rosuvastatin. Hold preadmission furosemide.  RENAL A:   Hypokalemia - s/p repletion in ED. AoCKD. P:   NS @ 75. BMP in AM.  GASTROINTESTINAL A:   GI prophylaxis. Nutrition. P:   SUP: Famotidine. NPO.  HEMATOLOGIC A:   VTE Prophylaxis. P:  SCD's / heparin. CBC in AM.  INFECTIOUS A:   No indication of infection. P:   Monitor clinically.  ENDOCRINE A:   Hyperglycemia - no hx DM. P:   SSI.  NEUROLOGIC A:   Acute encephalopathy. Hx RLS, panic attacks, ETOH abuse (quit 2012), DDD. P:   Sedation:  Fentanyl gtt / Midazolam gtt. RASS goal: 0 to -1. Daily WUA. Hold preadmission alprazolam.  Family updated: Son updated at bedside.  Interdisciplinary Family Meeting v Palliative Care Meeting:  Due by: 10/21/16.  CC time: 35 minutes.   Montey Hora, [facility] Pager: [phone]  or [phone] 10/15/2016, 1:47 PM

## 2016-10-15 NOTE — ED Notes (Signed)
Respiratory on way to place pt on continuous neb

## 2016-10-15 NOTE — ED Notes (Signed)
Report attempted to 24M.

## 2016-10-16 ENCOUNTER — Inpatient Hospital Stay (HOSPITAL_COMMUNITY): Payer: Medicare Other

## 2016-10-16 ENCOUNTER — Inpatient Hospital Stay (HOSPITAL_COMMUNITY): Payer: Medicare Other | Admitting: Certified Registered Nurse Anesthetist

## 2016-10-16 ENCOUNTER — Encounter (HOSPITAL_COMMUNITY): Payer: Self-pay | Admitting: Internal Medicine

## 2016-10-16 ENCOUNTER — Ambulatory Visit (HOSPITAL_COMMUNITY): Admission: RE | Admit: 2016-10-16 | Payer: Medicare Other | Source: Ambulatory Visit | Admitting: Otolaryngology

## 2016-10-16 ENCOUNTER — Encounter (HOSPITAL_COMMUNITY)
Admission: EM | Disposition: A | Discharge status: Home Health | Payer: Self-pay | Source: Home / Self Care | Attending: Family Medicine

## 2016-10-16 DIAGNOSIS — I2510 Atherosclerotic heart disease of native coronary artery without angina pectoris: Secondary | ICD-10-CM

## 2016-10-16 HISTORY — PX: MICROLARYNGOSCOPY WITH LASER: SHX5972

## 2016-10-16 LAB — CBC
HEMATOCRIT: 30.8 % — AB (ref 36.0–46.0)
Hemoglobin: 9.3 g/dL — ABNORMAL LOW (ref 12.0–15.0)
MCH: 25.7 pg — AB (ref 26.0–34.0)
MCHC: 30.2 g/dL (ref 30.0–36.0)
MCV: 85.1 fL (ref 78.0–100.0)
Platelets: 303 10*3/uL (ref 150–400)
RBC: 3.62 MIL/uL — ABNORMAL LOW (ref 3.87–5.11)
RDW: 15 % (ref 11.5–15.5)
WBC: 7.8 10*3/uL (ref 4.0–10.5)

## 2016-10-16 LAB — ECHOCARDIOGRAM COMPLETE
EERAT: 9.02
EWDT: 140 ms
FS: 40 % (ref 28–44)
HEIGHTINCHES: 62 in
IVS/LV PW RATIO, ED: 0.94
LA ID, A-P, ES: 50 mm
LA diam end sys: 50 mm
LA diam index: 2.79 cm/m2
LA vol index: 43.6 mL/m2
LAVOL: 78.2 mL
LAVOLA4C: 94.8 mL
LVEEAVG: 9.02
LVEEMED: 9.02
LVELAT: 10.2 cm/s
LVOT SV: 48 mL
LVOT VTI: 15.2 cm
LVOT area: 3.14 cm2
LVOT diameter: 20 mm
LVOTPV: 96.9 cm/s
MV Dec: 140
MVPG: 3 mmHg
MVPKEVEL: 92 m/s
PV Reg vel dias: 82.8 cm/s
PW: 12.4 mm — AB (ref 0.6–1.1)
RV LATERAL S' VELOCITY: 10.1 cm/s
Reg peak vel: 304 cm/s
TAPSE: 12.9 mm
TDI e' lateral: 10.2
TR max vel: 304 cm/s
WEIGHTICAEL: 2522.06 [oz_av]

## 2016-10-16 LAB — GLUCOSE, CAPILLARY
GLUCOSE-CAPILLARY: 169 mg/dL — AB (ref 65–99)
GLUCOSE-CAPILLARY: 199 mg/dL — AB (ref 65–99)
Glucose-Capillary: 153 mg/dL — ABNORMAL HIGH (ref 65–99)
Glucose-Capillary: 154 mg/dL — ABNORMAL HIGH (ref 65–99)
Glucose-Capillary: 158 mg/dL — ABNORMAL HIGH (ref 65–99)
Glucose-Capillary: 166 mg/dL — ABNORMAL HIGH (ref 65–99)

## 2016-10-16 LAB — BASIC METABOLIC PANEL
Anion gap: 12 (ref 5–15)
BUN: 19 mg/dL (ref 6–20)
CALCIUM: 9.4 mg/dL (ref 8.9–10.3)
CO2: 30 mmol/L (ref 22–32)
CREATININE: 2.19 mg/dL — AB (ref 0.44–1.00)
Chloride: 98 mmol/L — ABNORMAL LOW (ref 101–111)
GFR calc Af Amer: 25 mL/min — ABNORMAL LOW (ref >60)
GFR calc non Af Amer: 21 mL/min — ABNORMAL LOW (ref >60)
GLUCOSE: 192 mg/dL — AB (ref 65–99)
Potassium: 2.3 mmol/L — CL (ref 3.5–5.1)
Sodium: 140 mmol/L (ref 135–145)

## 2016-10-16 LAB — BLOOD GAS, ARTERIAL
Acid-Base Excess: 6.7 mmol/L — ABNORMAL HIGH (ref 0.0–2.0)
Bicarbonate: 31.6 mmol/L — ABNORMAL HIGH (ref 20.0–28.0)
DRAWN BY: 345601
FIO2: 40
MECHVT: 330 mL
O2 Saturation: 98.6 %
PATIENT TEMPERATURE: 97.3
PCO2 ART: 51.7 mmHg — AB (ref 32.0–48.0)
PEEP: 5 cmH2O
PO2 ART: 140 mmHg — AB (ref 83.0–108.0)
RATE: 12 resp/min
pH, Arterial: 7.399 (ref 7.350–7.450)

## 2016-10-16 LAB — PHOSPHORUS: Phosphorus: 4.4 mg/dL (ref 2.5–4.6)

## 2016-10-16 LAB — HEMOGLOBIN A1C
Hgb A1c MFr Bld: 6.9 % — ABNORMAL HIGH (ref 4.8–5.6)
MEAN PLASMA GLUCOSE: 151 mg/dL

## 2016-10-16 LAB — POTASSIUM: POTASSIUM: 4.1 mmol/L (ref 3.5–5.1)

## 2016-10-16 LAB — MAGNESIUM: MAGNESIUM: 2.1 mg/dL (ref 1.7–2.4)

## 2016-10-16 LAB — TROPONIN I: Troponin I: 1 ng/mL (ref <0.03)

## 2016-10-16 SURGERY — MICROLARYNGOSCOPY, WITH PROCEDURE USING LASER
Anesthesia: General | Site: Throat

## 2016-10-16 MED ORDER — TRIAMCINOLONE ACETONIDE 40 MG/ML IJ SUSP
INTRAMUSCULAR | Status: DC | PRN
  Administered 2016-10-16: 160 mg via INTRADERMAL

## 2016-10-16 MED ORDER — PHENYLEPHRINE 40 MCG/ML (10ML) SYRINGE FOR IV PUSH (FOR BLOOD PRESSURE SUPPORT)
PREFILLED_SYRINGE | INTRAVENOUS | Status: AC
  Filled 2016-10-16: qty 10

## 2016-10-16 MED ORDER — 0.9 % SODIUM CHLORIDE (POUR BTL) OPTIME
TOPICAL | Status: DC | PRN
  Administered 2016-10-16: 1000 mL

## 2016-10-16 MED ORDER — COCAINE HCL 4 % EX SOLN
CUTANEOUS | Status: AC
  Filled 2016-10-16: qty 4

## 2016-10-16 MED ORDER — ROCURONIUM BROMIDE 10 MG/ML (PF) SYRINGE
PREFILLED_SYRINGE | INTRAVENOUS | Status: AC
  Filled 2016-10-16: qty 10

## 2016-10-16 MED ORDER — POTASSIUM CHLORIDE 20 MEQ/15ML (10%) PO SOLN
40.0000 meq | ORAL | Status: AC
  Administered 2016-10-16 (×2): 40 meq
  Filled 2016-10-16 (×2): qty 30

## 2016-10-16 MED ORDER — ROCURONIUM BROMIDE 10 MG/ML (PF) SYRINGE
PREFILLED_SYRINGE | INTRAVENOUS | Status: DC | PRN
  Administered 2016-10-16: 30 mg via INTRAVENOUS
  Administered 2016-10-16: 50 mg via INTRAVENOUS

## 2016-10-16 MED ORDER — LACTATED RINGERS IV SOLN
INTRAVENOUS | Status: DC | PRN
  Administered 2016-10-16 (×2): via INTRAVENOUS

## 2016-10-16 MED ORDER — FENTANYL CITRATE (PF) 100 MCG/2ML IJ SOLN
INTRAMUSCULAR | Status: AC
  Filled 2016-10-16: qty 4

## 2016-10-16 MED ORDER — GLYCOPYRROLATE 0.2 MG/ML IV SOSY
PREFILLED_SYRINGE | INTRAVENOUS | Status: DC | PRN
  Administered 2016-10-16: 0.6 mg via INTRAVENOUS

## 2016-10-16 MED ORDER — PHENYLEPHRINE HCL 10 MG/ML IJ SOLN
INTRAMUSCULAR | Status: DC | PRN
  Administered 2016-10-16 (×2): 120 ug via INTRAVENOUS

## 2016-10-16 MED ORDER — TRIAMCINOLONE ACETONIDE 40 MG/ML IJ SUSP
INTRAMUSCULAR | Status: AC
  Filled 2016-10-16: qty 5

## 2016-10-16 MED ORDER — MIDAZOLAM HCL 2 MG/2ML IJ SOLN
INTRAMUSCULAR | Status: AC
  Filled 2016-10-16: qty 2

## 2016-10-16 MED ORDER — ONDANSETRON HCL 4 MG/2ML IJ SOLN
INTRAMUSCULAR | Status: DC | PRN
  Administered 2016-10-16: 4 mg via INTRAVENOUS

## 2016-10-16 MED ORDER — PROPOFOL 10 MG/ML IV BOLUS
INTRAVENOUS | Status: DC | PRN
  Administered 2016-10-16: 40 mg via INTRAVENOUS

## 2016-10-16 MED ORDER — EPHEDRINE 5 MG/ML INJ
INTRAVENOUS | Status: AC
  Filled 2016-10-16: qty 10

## 2016-10-16 MED ORDER — EPINEPHRINE HCL (NASAL) 0.1 % NA SOLN
NASAL | Status: AC
  Filled 2016-10-16: qty 30

## 2016-10-16 MED ORDER — SUCCINYLCHOLINE CHLORIDE 200 MG/10ML IV SOSY
PREFILLED_SYRINGE | INTRAVENOUS | Status: AC
  Filled 2016-10-16: qty 10

## 2016-10-16 MED ORDER — PROPOFOL 1000 MG/100ML IV EMUL
INTRAVENOUS | Status: AC
  Filled 2016-10-16: qty 100

## 2016-10-16 MED ORDER — DEXAMETHASONE SODIUM PHOSPHATE 10 MG/ML IJ SOLN
INTRAMUSCULAR | Status: DC | PRN
  Administered 2016-10-16: 10 mg via INTRAVENOUS

## 2016-10-16 MED ORDER — SUCCINYLCHOLINE CHLORIDE 200 MG/10ML IV SOSY
PREFILLED_SYRINGE | INTRAVENOUS | Status: DC | PRN
  Administered 2016-10-16: 30 mg via INTRAVENOUS

## 2016-10-16 MED ORDER — PROPOFOL 500 MG/50ML IV EMUL
INTRAVENOUS | Status: DC | PRN
  Administered 2016-10-16: 75 ug/kg/min via INTRAVENOUS

## 2016-10-16 MED ORDER — POTASSIUM CHLORIDE 10 MEQ/100ML IV SOLN
10.0000 meq | INTRAVENOUS | Status: AC
  Administered 2016-10-16 (×2): 10 meq via INTRAVENOUS
  Filled 2016-10-16: qty 100

## 2016-10-16 MED ORDER — DEXMEDETOMIDINE HCL IN NACL 200 MCG/50ML IV SOLN
0.4000 ug/kg/h | INTRAVENOUS | Status: DC
  Administered 2016-10-16 (×2): 0.4 ug/kg/h via INTRAVENOUS
  Administered 2016-10-17 (×3): 0.6 ug/kg/h via INTRAVENOUS
  Administered 2016-10-17: 0.4 ug/kg/h via INTRAVENOUS
  Administered 2016-10-18 – 2016-10-19 (×6): 0.6 ug/kg/h via INTRAVENOUS
  Administered 2016-10-19 (×2): 0.3 ug/kg/h via INTRAVENOUS
  Administered 2016-10-19 (×2): 0.7 ug/kg/h via INTRAVENOUS
  Administered 2016-10-20 (×3): 0.8 ug/kg/h via INTRAVENOUS
  Administered 2016-10-20: 0.9 ug/kg/h via INTRAVENOUS
  Filled 2016-10-16 (×20): qty 50

## 2016-10-16 MED ORDER — OXYMETAZOLINE HCL 0.05 % NA SOLN
NASAL | Status: DC | PRN
  Administered 2016-10-16: 1 via TOPICAL

## 2016-10-16 MED ORDER — FENTANYL CITRATE (PF) 100 MCG/2ML IJ SOLN
INTRAMUSCULAR | Status: DC | PRN
  Administered 2016-10-16 (×4): 50 ug via INTRAVENOUS

## 2016-10-16 MED ORDER — EPHEDRINE SULFATE-NACL 50-0.9 MG/10ML-% IV SOSY
PREFILLED_SYRINGE | INTRAVENOUS | Status: DC | PRN
  Administered 2016-10-16: 5 mg via INTRAVENOUS

## 2016-10-16 MED ORDER — MIDAZOLAM HCL 5 MG/5ML IJ SOLN
INTRAMUSCULAR | Status: DC | PRN
  Administered 2016-10-16: 2 mg via INTRAVENOUS

## 2016-10-16 MED ORDER — NEOSTIGMINE METHYLSULFATE 5 MG/5ML IV SOSY
PREFILLED_SYRINGE | INTRAVENOUS | Status: DC | PRN
  Administered 2016-10-16: 4 mg via INTRAVENOUS

## 2016-10-16 MED ORDER — OXYMETAZOLINE HCL 0.05 % NA SOLN
NASAL | Status: AC
  Filled 2016-10-16: qty 15

## 2016-10-16 MED ORDER — DEXAMETHASONE SODIUM PHOSPHATE 10 MG/ML IJ SOLN
INTRAMUSCULAR | Status: AC
  Filled 2016-10-16: qty 1

## 2016-10-16 SURGICAL SUPPLY — 30 items
APPLICATOR COTTON TIP 6IN STRL (MISCELLANEOUS) ×3 IMPLANT
BANDAGE EYE OVAL (MISCELLANEOUS) IMPLANT
CANISTER SUCT 1200ML W/VALVE (MISCELLANEOUS) ×3 IMPLANT
CONT SPECI 4OZ STER CLIK (MISCELLANEOUS) ×6 IMPLANT
DRAPE PROXIMA HALF (DRAPES) ×3 IMPLANT
GAUZE SPONGE 4X4 12PLY STRL (GAUZE/BANDAGES/DRESSINGS) IMPLANT
GAUZE SPONGE 4X4 16PLY XRAY LF (GAUZE/BANDAGES/DRESSINGS) IMPLANT
GLOVE ECLIPSE 8.0 STRL XLNG CF (GLOVE) ×6 IMPLANT
GOWN STRL REUS W/ TWL LRG LVL3 (GOWN DISPOSABLE) ×1 IMPLANT
GOWN STRL REUS W/ TWL XL LVL3 (GOWN DISPOSABLE) ×2 IMPLANT
GOWN STRL REUS W/TWL LRG LVL3 (GOWN DISPOSABLE) ×2
GOWN STRL REUS W/TWL XL LVL3 (GOWN DISPOSABLE) ×4
GUARD TEETH (MISCELLANEOUS) ×3 IMPLANT
KIT BASIN OR (CUSTOM PROCEDURE TRAY) ×3 IMPLANT
MARKER SKIN DUAL TIP RULER LAB (MISCELLANEOUS) ×3 IMPLANT
NEEDLE 1/2 CIR CATGUT .05X1.09 (NEEDLE) IMPLANT
NEEDLE 18GX1X1/2 (RX/OR ONLY) (NEEDLE) ×3 IMPLANT
NEEDLE HYPO 25GX1X1/2 BEV (NEEDLE) IMPLANT
NEEDLE INJECT RIGID (NEEDLE) ×3 IMPLANT
NEEDLE SPNL 22GX3.5 QUINCKE BK (NEEDLE) IMPLANT
NEEDLE TRANS ORAL INJECTION (NEEDLE) ×3 IMPLANT
NS IRRIG 1000ML POUR BTL (IV SOLUTION) IMPLANT
PACK SURGICAL SETUP 50X90 (CUSTOM PROCEDURE TRAY) ×3 IMPLANT
PATTIES SURGICAL .5 X3 (DISPOSABLE) ×6 IMPLANT
SOLUTION BUTLER CLEAR DIP (MISCELLANEOUS) ×3 IMPLANT
SYR 3ML LL SCALE MARK (SYRINGE) ×6 IMPLANT
SYR 5ML LL (SYRINGE) ×3 IMPLANT
TOWEL OR 17X24 6PK STRL BLUE (TOWEL DISPOSABLE) ×6 IMPLANT
TUBE CONNECTING 20'X1/4 (TUBING) ×1
TUBE CONNECTING 20X1/4 (TUBING) ×2 IMPLANT

## 2016-10-16 NOTE — Progress Notes (Signed)
[facility] Progress Note Patient Name: [name] DOB: 1946-06-18 MRN: [id number]   Date of Service  10/16/2016  HPI/Events of Note  K 2.3  eICU Interventions  repleted     Intervention Category Major Interventions: Electrolyte abnormality - evaluation and management  Wilhelmina Mcardle 10/16/2016, 3:45 AM

## 2016-10-16 NOTE — Anesthesia Procedure Notes (Signed)
Date/Time: 10/16/2016 4:10 PM Performed by: Garrison Columbus T Pre-anesthesia Checklist: Emergency Drugs available, Suction available, Patient being monitored and Patient identified Patient Re-evaluated:Patient Re-evaluated prior to inductionOxygen Delivery Method: Circle system utilized Preoxygenation: Pre-oxygenation with 100% oxygen Intubation Type: Inhalational induction Tube type: Subglottic suction tube Tube size: 7.0 mm Placement Confirmation: positive ETCO2 and breath sounds checked- equal and bilateral Tube secured with: Tape Dental Injury: Teeth and Oropharynx as per pre-operative assessment

## 2016-10-16 NOTE — Transfer of Care (Addendum)
Immediate Anesthesia Transfer of Care Note  Patient: Jaden Placido-Polk  Procedure(s) Performed: Procedure(s): MICRODIRECTLARYNGOSCOPY WITH LASER ABLATION AND KENLOG INJECTION (N/A)  Patient Location: ICU  Anesthesia Type:General  Level of Consciousness: sedated, unresponsive and Patient remains intubated per anesthesia plan  Airway & Oxygen Therapy: Patient remains intubated per anesthesia plan and Patient placed on Ventilator (see vital sign flow sheet for setting)  Post-op Assessment: Report given to RN and Post -op Vital signs reviewed and stable  Post vital signs: Reviewed and stable  Last Vitals:  Vitals:   10/16/16 1603 10/16/16 1843  BP:    Pulse: 75 70  Resp: 12 12  Temp:      Last Pain:  Vitals:   10/16/16 1134  TempSrc: Oral  PainSc:          Complications: No apparent anesthesia complications

## 2016-10-16 NOTE — Progress Notes (Signed)
Patient off the floor for procedure.

## 2016-10-16 NOTE — Anesthesia Preprocedure Evaluation (Signed)
Anesthesia Evaluation  Patient identified by MRN, date of birth, ID band Patient unresponsive    Reviewed: Allergy & Precautions, NPO status , Patient's Chart, lab work & pertinent test results, Unable to perform ROS - Chart review only  History of Anesthesia Complications (+) DIFFICULT AIRWAY and history of anesthetic complications  Airway Mallampati: Intubated      Comment: 6.5 oral ETT placed 8/1 with glidescope #4, 2 attempts Dental  (+) Teeth Intact, Dental Advisory Given   Pulmonary shortness of breath, asthma , sleep apnea , COPD, former smoker,  Acute hypoxic respiratory failure secondary to complications from TEE   breath sounds clear to auscultation       Cardiovascular hypertension, + angina +CHF  + dysrhythmias Atrial Fibrillation + pacemaker  Rhythm:Irregular Rate:Normal  TEE 7/27:  ?stunned LV with severe hypokinesis and EF 20-25% and non obstructive HOCM  TTE 7/28: LVEF 55-60%, moderate asymmetric septal hypertrophy, intermittent diastolic dysfunction (AF), no RWMA, PA peak 62  Impressions: - The patient was in atrial fibrillation. Normal LV size with   moderate asymmetric septal hypertrophy. No LVOT gradient or   mitral valve SAM. EF 55-60%. This could be consistent with   hypertrophic nonobstructive cardiomyopathy. There was mild MR.   Mildly dilated RV with moderately decreased systolic function.   Moderate pulmonary hypertension.   Neuro/Psych  Headaches, PSYCHIATRIC DISORDERS Anxiety Depression Questionable seizures, EEG negative    GI/Hepatic Neg liver ROS, GERD  ,(+)     substance abuse (h/o alcohol abuse)  , Fatty liver H/o GI bleed   Endo/Other  negative endocrine ROSHyperglycemia likely secondary to high-dose steroids for airway edema  Renal/GU Renal InsufficiencyRenal disease     Musculoskeletal  (+) Arthritis , Polymyalgia rheumatic on chronic steroids (prednisone 5mg  daily)     Abdominal   Peds  Hematology  (+) Blood dyscrasia, anemia ,   Anesthesia Other Findings Day of surgery medications reviewed with the patient.  Reproductive/Obstetrics                             Anesthesia Physical  Anesthesia Plan  ASA: IV  Anesthesia Plan: General   Post-op Pain Management:    Induction: Inhalational  Airway Management Planned: Oral ETT  Additional Equipment:   Intra-op Plan:   Post-operative Plan: Post-operative intubation/ventilation  Informed Consent: I have reviewed the patients History and Physical, chart, labs and discussed the procedure including the risks, benefits and alternatives for the proposed anesthesia with the patient or authorized representative who has indicated his/her understanding and acceptance.   Dental advisory given  Plan Discussed with: CRNA  Anesthesia Plan Comments: (ICU bring back, intubated, sedated.)        Anesthesia Quick Evaluation

## 2016-10-16 NOTE — Progress Notes (Signed)
Called Dr. Erik Obey to verify patient is still on schedule for procedure. MD confirmed and given ok to start Ancef.

## 2016-10-16 NOTE — Op Note (Signed)
10/16/2016  6:44 PM    Rudean Curt  [id number]   Pre-Op Dx:  Subglottic stenosis with respiratory compromise  Post-op Dx: Same  Proc: MicroDirect laryngoscopy with laser ablation, subglottic stenosis. Bronchoscopy.   Surg:  [name] T MD  Anes:  GOT converted to jet ventilation  EBL:  None  Comp:  None  Findings:  Polypoid changes of both vocal cords which are mobile. Granulation tissue in the bilateral subglottic area underneath the arytenoids and vocal processes.  Procedure: With the patient transferred from the intensive care unit intubated, was transferred onto the operating table. Anesthesia was induced per indwelling orotracheal tube.  A routine surgical timeout was obtained in the standard fashion.  The table was turned 90 and the patient placed in a slight reverse Trendelenburg. A rubber tooth guard was applied.   The Hollinger laryngoscope  was introduced and inspection of the supraglottic larynx was performed. The 7 subglottic endotracheal tube was quite snug in the posterior commissure.  The patient was extubated under direct vision. The laser laryngoscope was introduced and placed in the supraglottis. One port was used for light carrier and 1 for a jet ventilation cannula. Jet ventilation was accomplished without difficulty.  The 30 rigid bronchoscope was introduced and inspection of the subglottis and upper trachea was performed with the findings as described above.  Kenalog solution, 40 mg/mL, 3 mL including spillage was infiltrated into the subglottic mucosa bilaterally to retard scar formation postoperatively.  With the laser at a 32 W 0.5 second single pulse setting, thickened soft tissue over the arytenoid on each side and granulation tissue in the subglottis on each side was ablated. Care was taken not to communicate the raw surfaces across the posterior commissure.  Hemostasis was spontaneous. Jet ventilation was performed and interrupted  for the use of the laser off and on during the case. The 30 telescope was also used at intervals to assess the progress.  Upon achieving satisfactory reduction in granulation tissue, procedure was felt to have been completed.  Jet ventilation was continued while the patient awakened. Finally, motion of the false and true cords was observed.  The laryngoscope was then suspended and removed. The patient was returned to anesthesia. She was moving air poorly with mask ventilation, and also with LMA ventilation. She was reintubated with a 7.0 subglottic endotracheal tube. Secretions were evacuated from the bronchi on both sides. Satisfactory ventilation on both sides with good oxygen saturation was accomplished.  At this point the patient was transferred from the OR to the medical intensive care unit in stable condition intubated.   Dispo:   [facility] to [facility]  Plan:  She received intravenous Decadron. We will cover her for reflux. I will plan on extubating her under anesthesia in the OR in 3 days and if at that time she is unable to sustain a satisfactory glottic airway, we will perform tracheostomy.  Tyson Alias MD

## 2016-10-16 NOTE — Progress Notes (Signed)
Patient back from OR on propofol gtt.

## 2016-10-16 NOTE — Anesthesia Procedure Notes (Signed)
Procedure Name: Intubation Date/Time: 10/16/2016 6:14 PM Performed by: Garrison Columbus T Pre-anesthesia Checklist: Patient identified, Emergency Drugs available, Suction available and Patient being monitored Patient Re-evaluated:Patient Re-evaluated prior to inductionOxygen Delivery Method: Circle System Utilized Preoxygenation: Pre-oxygenation with 100% oxygen Intubation Type: IV induction and Rapid sequence Ventilation: Mask ventilation with difficulty and Oral airway inserted - appropriate to patient size Laryngoscope Size: Glidescope and 3 Grade View: Grade I Tube type: Subglottic suction tube Tube size: 7.0 mm Number of attempts: 1 Airway Equipment and Method: Stylet and Oral airway Placement Confirmation: ETT inserted through vocal cords under direct vision,  positive ETCO2 and breath sounds checked- equal and bilateral Secured at: 21 cm Tube secured with: Tape Dental Injury: Teeth and Oropharynx as per pre-operative assessment  Comments: Intubated by Dr. Royce Macadamia.

## 2016-10-16 NOTE — Progress Notes (Signed)
PULMONARY / CRITICAL CARE MEDICINE   Name: [name] MRN: [id number] DOB: Apr 15, 1946    ADMISSION DATE:  10/15/2016  REFERRING MD:  Lita Mains - EDP.  CHIEF COMPLAINT:  Dyspnea, stridor   Brief:    70yo female with multiple medical problems including dCHF, GERD, hx ETOH, AFib, CKD, angioedema with chronic upper airway narrowing after an intubation with recent laryngoscopy by ENT with removal of granulation tissue 3 weeks prior to admit but has continued to have dyspnea and stridor.  She presented 11/1 to ER with ongoing dyspnea and stridor.  She initially improved with steroids, BD and ativan and was seen by ENT who scheduled direct laryngoscopy for 11/2 but wanted cardiology clearance first r/t troponin 0.57.  Over time she became increasingly stridorous, tripoding, diaphoretic and required intubation in ER. PCCM called to admit.    STUDIES:  CXR 11/1 >  ETT in good position. CXR 11/2 : Increased left basilar densities. This could represent atelectasis or small effusion.  CULTURES: None.  ANTIBIOTICS: None.  SIGNIFICANT EVENTS: 11/1 > admitted with stridor / respiratory distress requiring intubation.  LINES/TUBES: ETT 11/1 >  Subjective: No issues overnight. Patient is on minimal sedation. Alert and following commands. Awaiting for direct laryngoscopy with laser ablation today.   PAST MEDICAL HISTORY :  She  has a past medical history of Anemia; Angina; Angioedema; Arteriovenous malformation of stomach; Arthritis; Asthma; AVM (arteriovenous malformation) of colon; Blood transfusion; CHF (congestive heart failure) ([place]); Complete heart block ([place]); Complication of anesthesia; DDD (degenerative disc disease); Depression; Diastolic heart failure; Fatty liver (07/26/10); GERD (gastroesophageal reflux disease); GI bleed; Headache; History of alcohol abuse (Stopped Fall 2012); History of tobacco use (Quit Fall 2012); cardiovascular stress test; colonic polyp (08/13/10);  colonoscopy; Hyperlipidemia; Hyperlipidemia; Hypertension; Hypertrophic cardiomyopathy ([place]); Iron deficiency anemia; Panic attack; Panic attacks; Permanent atrial fibrillation ([place]); Pneumonia; Renal failure; Right arm pain (01/08/2012); RLS (restless legs syndrome); Shortness of breath; and Sleep apnea.  PAST SURGICAL HISTORY: She  has a past surgical history that includes Tubal ligation (04/01/2000); Pacemaker insertion (1998); Cardiac catheterization; Esophagogastroduodenoscopy (12/23/2011); Givens capsule study (12/23/2011); Extubation (endotracheal) in or (N/A, 07/21/2016); Cardiac catheterization (N/A, 08/19/2016); and Direct laryngoscopy (N/A, 09/18/2016).  Allergies  Allergen Reactions  . Ace Inhibitors Other (See Comments)    Angioedema  . Other Anaphylaxis and Swelling    Unnamed anesthesia  . Propofol Anaphylaxis and Swelling    No current facility-administered medications on file prior to encounter.    Current Outpatient Prescriptions on File Prior to Encounter  Medication Sig  . acetaminophen (TYLENOL) 325 MG tablet Take 325 mg by mouth daily as needed (pain).  [name] albuterol (PROVENTIL) (2.5 MG/3ML) 0.083% nebulizer solution Take 3 mLs (2.5 mg total) by nebulization every 6 (six) hours as needed for wheezing or shortness of breath.  . ALPRAZolam (XANAX) 0.5 MG tablet Take 1 tablet (0.5 mg total) by mouth at bedtime as needed for anxiety.  [name] aspirin EC 81 MG tablet Take 81 mg by mouth every morning.   . bisacodyl (DULCOLAX) 10 MG suppository Place 10 mg rectally daily as needed for mild constipation.   [name] CALCIUM PO Take 1 tablet by mouth once a week.   . ferrous sulfate 325 (65 FE) MG tablet TAKE 1 TABLET BY MOUTH TWICE DAILY WITH MEALS TO KEEP BLOOD COUNT UP (Patient taking differently: Take 325 mg by mouth 2 (two) times daily with a meal. TO KEEP BLOOD COUNT UP)  . furosemide (LASIX) 20 MG tablet Take 20 mg by mouth  as directed. TAKE 1 TABLET BY MOUTH AS NEEDED FOR WEIGHT GAIN (2 LBS IN  24 HRS OR 5 LBS IN 1 WEEK)  . nitroGLYCERIN (NITROSTAT) 0.4 MG SL tablet Place 1 tablet (0.4 mg total) under the tongue every 5 (five) minutes as needed for chest pain.  [name] omeprazole (PRILOSEC) 20 MG capsule Take 1 capsule (20 mg total) by mouth daily.  . polyethylene glycol (MIRALAX / GLYCOLAX) packet Take 17 g by mouth daily.  . rosuvastatin (CRESTOR) 20 MG tablet Take 1 tablet (20 mg total) by mouth daily.  [name] senna (SENOKOT) 8.6 MG tablet Take 1 tablet by mouth at bedtime as needed for constipation.   . trolamine salicylate (ASPERCREME) 10 % cream Apply 1 application topically 2 (two) times daily as needed for muscle pain.     FAMILY HISTORY:  Her indicated that her mother is deceased. She indicated that her father is deceased.    SOCIAL HISTORY: She  reports that she quit smoking about 5 years ago. Her smoking use included Cigarettes. She has a 5.00 pack-year smoking history. She has never used smokeless tobacco. She reports that she drinks alcohol. She reports that she does not use drugs.  REVIEW OF SYSTEMS:   Unable, sedated on vent.  As per HPI above obtained from records and family.   SUBJECTIVE:   On vent, agitated.  VITAL SIGNS: BP 113/75   Pulse 75   Temp 98.5 F (36.9 C) (Oral)   Resp 14   Ht 5\' 2"  (1.575 m)   Wt 157 lb 10.1 oz (71.5 kg)   SpO2 100%   BMI 28.83 kg/m   HEMODYNAMICS:    VENTILATOR SETTINGS: Vent Mode: PRVC FiO2 (%):  [40 %-100 %] 40 % Set Rate:  [12 bmp-16 bmp] 12 bmp Vt Set:  [330 mL-460 mL] 330 mL PEEP:  [5 cmH20] 5 cmH20 Plateau Pressure:  [12 cmH20-15 cmH20] 15 cmH20  INTAKE / OUTPUT: I/O last 3 completed shifts: In: 1636.1 [I.V.:1156.1; NG/GT:30; IV Piggyback:450] Out: 235 [Urine:235]  PHYSICAL EXAMINATION: General: AA female, resting comfortably on vent. Neuro: On vent, awake, MAE's. HEENT: [place]/AT. PERRL, sclerae anicteric. Cardiovascular: RRR, no M/R/G.  Lungs: Respirations even and unlabored.  Clear, diminished breath sounds  bilaterally, No W/R/R. No stridor. Abdomen: BS x 4, soft, NT/ND.  Musculoskeletal: No gross deformities, no edema.  Skin: Intact, warm, no rashes.   LABS:  BMET  Recent Labs Lab 10/15/16 1040 10/15/16 1619 10/16/16 0252  NA 140  --  140  K 2.4*  --  2.3*  CL 93*  --  98*  CO2 32  --  30  BUN 14  --  19  CREATININE 2.07* 2.27* 2.19*  GLUCOSE 180*  --  192*    Electrolytes  Recent Labs Lab 10/15/16 1040 10/15/16 1207 10/16/16 0252  CALCIUM 9.9  --  9.4  MG  --  2.0 2.1  PHOS  --   --  4.4    CBC  Recent Labs Lab 10/15/16 1040 10/16/16 0252  WBC 12.9* 7.8  HGB 10.5* 9.3*  HCT 34.4* 30.8*  PLT 363 303    Coag's No results for input(s): APTT, INR in the last 168 hours.  Sepsis Markers No results for input(s): LATICACIDVEN, PROCALCITON, O2SATVEN in the last 168 hours.  ABG  Recent Labs Lab 10/15/16 1331 10/15/16 1541 10/16/16 0308  PHART 7.431 7.624* 7.399  PCO2ART 52.4* 29.8* 51.7*  PO2ART 35.0* 141.0* 140*    Liver Enzymes  Recent Labs Lab 10/15/16  1040  AST 30  ALT 9*  ALKPHOS 54  BILITOT 0.5  ALBUMIN 3.4*    Cardiac Enzymes  Recent Labs Lab 10/15/16 1619 10/15/16 2121 10/16/16 0252  TROPONINI 0.93* 1.03* 1.00*    Glucose  Recent Labs Lab 10/15/16 1635 10/15/16 1820 10/15/16 2003 10/16/16 0004 10/16/16 0324 10/16/16 0747  GLUCAP 207* 207* 207* 199* 166* 158*    Imaging Dg Chest Port 1 View  Result Date: 10/16/2016 CLINICAL DATA:  Respiratory failure. EXAM: PORTABLE CHEST 1 VIEW COMPARISON:  10/15/2016 FINDINGS: Endotracheal tube is 3.3 cm above the carina. Nasogastric tube extends to the abdomen and appears to extend into the proximal duodenum. Stable position of the right cardiac pacemaker leads. Heart size is mildly enlarged. Densities near the left costophrenic angle could represent atelectasis or small effusion. No evidence for pulmonary edema. Negative for a pneumothorax. IMPRESSION: Increased left basilar  densities. This could represent atelectasis or small effusion. Support apparatuses as described. Electronically Signed   By: Markus Daft M.D.   On: 10/16/2016 07:10   Dg Chest Portable 1 View  Result Date: 10/15/2016 CLINICAL DATA:  Intubation. Asthma. Congestive heart failure. Atrial fibrillation. EXAM: PORTABLE CHEST 1 VIEW COMPARISON:  10/15/2016 FINDINGS: Endotracheal tube tip 3.5 cm above the carina. Nasogastric tube enters the stomach. Dual lead pacer noted. Mild enlargement of the cardiopericardial silhouette. Upper zone pulmonary vascular prominence could be from supine positioning. Linear bandlike density in the left retrocardiac region potentially from atelectasis. IMPRESSION: 1. Tubes and lines satisfactorily position. 2. Mild enlargement of the cardiopericardial silhouette, without overt edema. 3. Left retrocardiac subsegmental atelectasis. Electronically Signed   By: Van Clines M.D.   On: 10/15/2016 13:03   Dg Chest Portable 1 View  Result Date: 10/15/2016 CLINICAL DATA:  Respiratory distress, shortness of Breath EXAM: PORTABLE CHEST 1 VIEW COMPARISON:  08/17/2016 FINDINGS: Right pacer remains in place, unchanged. Cardiomegaly. No confluent opacities or effusions. No acute bony abnormality. IMPRESSION: Cardiomegaly.  No active disease. Electronically Signed   By: Rolm Baptise M.D.   On: 10/15/2016 12:13    ASSESSMENT / PLAN:  PULMONARY A: Acute hypoxic respiratory failure - due to stridor from laryngeal stenosis following intubation on prior admit. Hx OSA, Former smoker. P:   Full vent support. Wean as able. VAP prevention measures. ENT following, planning on direct laryngoscopy with laser ablation today 11/2. SBT only after ENT procedures performed. May eventually require trach. Albuterol PRN. Decadron scheduled.  CARDIOVASCULAR A:  Troponin leak - consider demand - down-trending  dCHF - RHC and LHC 08/19/16 with normal coronary arteries, severe diastolic dysfunction  or significant MR, normal LV. Hx HCM, HTN, HLD P:  Trend troponin. Cardiology consulted by EDP. Continue ASA, rosuvastatin. Repeat EKG  ECHO pending  Hold home furosemide.  RENAL A:   Hypokalemia - s/p repletion  AoCKD (Admission Cret 2.07/ BUN 14) P:   Electrolyte replacement as needed  NS @ 75. Trend BMP   GASTROINTESTINAL A:   GI prophylaxis. Nutrition. P:   SUP: Famotidine. NPO.  HEMATOLOGIC A:   VTE Prophylaxis. P:  SCD's  SQ Heparin  CBC in AM.  INFECTIOUS A:   No indication of infection. P:   Monitor clinically. Trend WBC and Fever Curve   ENDOCRINE A:   Hyperglycemia - no hx DM. P:   SSI. q4H glucose checks   NEUROLOGIC A:   Acute encephalopathy. Hx RLS, panic attacks, ETOH abuse (quit 2012), DDD. P:   Sedation:  Fentanyl gtt / Midazolam gtt. RASS goal:  0 to -1. Daily WUA. Hold preadmission alprazolam.  Family updated: Daughter at bedside update on plan for today.   Interdisciplinary Family Meeting v Palliative Care Meeting:  Due by: 10/21/16.  [name], AG-ACNP [facility]  Pgr: [phone]  PCCM Pgr: [phone]

## 2016-10-16 NOTE — Progress Notes (Addendum)
Initial Nutrition Assessment  INTERVENTION:  When ready for tube feeding, start PepUp Protocol: Initiate TF via OGT with Vital High Protein at goal rate of 55 ml/h (1320 ml per day) to provide 1320 kcals, 116 gm protein, 1104 ml free water daily.   NUTRITION DIAGNOSIS:   Inadequate oral intake related to inability to eat as evidenced by NPO status.   GOAL:   Patient will meet greater than or equal to 90% of their needs   MONITOR:   Vent status, TF tolerance, Labs, I & O's, Skin, Weight trends  REASON FOR ASSESSMENT:   Ventilator    ASSESSMENT:   70yo female with multiple medical problems including dCHF, GERD, hx ETOH, AFib, CKD, angioedema with chronic upper airway narrowing after an intubation with recent laryngoscopy by ENT with removal of granulation tissue 3 weeks prior to admit but has continued to have dyspnea and stridor.  She presented 11/1 to ER with ongoing dyspnea and stridor.  She initially improved with steroids, BD and ativan and was seen by ENT who scheduled direct laryngoscopy for 11/2 but wanted cardiology clearance first r/t troponin 0.57.  Over time she became increasingly stridorous, tripoding, diaphoretic and required intubation in ER.   Pt awake on vent at time of visit. Family at bedside reports that patient has not been eating well due to a poor appetite and difficulty breathing; she recently started drinking Ensure twice daily at home (vanilla or strawberry). Per family, pt is to have surgery this afternoon. Pt has some mild muscle wasting of thighs, but otherwise appears well-nourished. Weight loss is difficult to assess due to fluctuations in weight. Per MD note, direct laryngoscopy with laser ablation today.  Patient is currently intubated on ventilator support MV: 4.6 L/min Temp (24hrs), Avg:98.4 F (36.9 C), Min:97.3 F (36.3 C), Max:99 F (37.2 C)  Propofol: none  Labs: glucose ranging 158 to 207 mg/dL, low potassium, low chloride, elevated  creatinine, low GFR, low hemoglobin  Diet Order:  Diet NPO time specified  Skin:  Reviewed, no issues  Last BM:  PTA  Height:   Ht Readings from Last 1 Encounters:  10/15/16 5\' 2"  (1.575 m)    Weight:   Wt Readings from Last 1 Encounters:  10/16/16 157 lb 10.1 oz (71.5 kg)    Ideal Body Weight:  50 kg  BMI:  Body mass index is 28.83 kg/m.  Estimated Nutritional Needs:   Kcal:  1279  Protein:  110-130 grams  Fluid:  2.2 L/day  EDUCATION NEEDS:   No education needs identified at this time  [place], CSP, LDN Inpatient Clinical Dietitian Pager: [phone] After Hours Pager: [phone]

## 2016-10-16 NOTE — Progress Notes (Signed)
[facility] Progress Note Patient Name: [name] DOB: Jan 26, 1946 MRN: [id number]   Date of Service  10/16/2016  HPI/Events of Note  Patient returns from the OR on Propofol. Order for Propofol IV infusion requested. Patient has a reported allergy to Propofol >> anaphylaxis and swelling.   eICU Interventions  Will order: 1. Precedex IV infusion. Titrate to RASS = -2.  2.D/C Propofol IV infusion.      Intervention Category Minor Interventions: Agitation / anxiety - evaluation and management  Sommer,Steven Eugene 10/16/2016, 7:45 PM

## 2016-10-16 NOTE — Progress Notes (Signed)
CRITICAL VALUE ALERT  Critical value received:  Potassium 2.3  Date of notification:  10/16/16  Time of notification:  2194  Critical value read back:Yes.    Nurse who received alert:  Colletta Maryland, RN  MD notified (1st page):  Elink  Time of first page:  [phone]  MD notified (2nd page):  Time of second page:  Responding MD:  Warren Lacy  Time MD responded:  [phone]

## 2016-10-16 NOTE — Progress Notes (Signed)
Wasted 200 cc of fentanyl down sink. Witnessed with second RN Alger Simons.

## 2016-10-16 NOTE — Progress Notes (Signed)
Echocardiogram 2D Echocardiogram has been performed.  [name] 10/16/2016, 11:42 AM

## 2016-10-16 NOTE — Progress Notes (Signed)
10/16/2016 11:25 AM  [place], [name] [phone]  Hosp Day 2    Temp:  [97.3 F (36.3 C)-99 F (37.2 C)] 98.5 F (36.9 C) (11/02 0748) Pulse Rate:  [56-81] 75 (11/02 0809) Resp:  [9-35] 14 (11/02 0809) BP: (99-180)/(60-101) 113/75 (11/02 0809) SpO2:  [99 %-100 %] 100 % (11/02 0809) FiO2 (%):  [40 %-100 %] 40 % (11/02 0809) Weight:  [68.6 kg (151 lb 3.8 oz)-71.5 kg (157 lb 10.1 oz)] 71.5 kg (157 lb 10.1 oz) (11/02 0435),     Intake/Output Summary (Last 24 hours) at 10/16/16 1125 Last data filed at 10/16/16 0900  Gross per 24 hour  Intake          1936.13 ml  Output              255 ml  Net          1681.13 ml    Results for orders placed or performed during the hospital encounter of 10/15/16 (from the past 24 hour(s))  Magnesium     Status: None   Collection Time: 10/15/16 12:07 PM  Result Value Ref Range   Magnesium 2.0 1.7 - 2.4 mg/dL  I-Stat arterial blood gas, ED     Status: Abnormal   Collection Time: 10/15/16  1:31 PM  Result Value Ref Range   pH, Arterial 7.431 7.350 - 7.450   pCO2 arterial 52.4 (H) 32.0 - 48.0 mmHg   pO2, Arterial 35.0 (LL) 83.0 - 108.0 mmHg   Bicarbonate 35.1 (H) 20.0 - 28.0 mmol/L   TCO2 37 0 - 100 mmol/L   O2 Saturation 70.0 %   Acid-Base Excess 9.0 (H) 0.0 - 2.0 mmol/L   Patient temperature 97.6 F    Collection site RADIAL, ALLEN'S TEST ACCEPTABLE    Drawn by Operator    Sample type ARTERIAL    Comment NOTIFIED PHYSICIAN   I-Stat arterial blood gas, ED     Status: Abnormal   Collection Time: 10/15/16  3:41 PM  Result Value Ref Range   pH, Arterial 7.624 (HH) 7.350 - 7.450   pCO2 arterial 29.8 (L) 32.0 - 48.0 mmHg   pO2, Arterial 141.0 (H) 83.0 - 108.0 mmHg   Bicarbonate 31.0 (H) 20.0 - 28.0 mmol/L   TCO2 32 0 - 100 mmol/L   O2 Saturation 100.0 %   Acid-Base Excess 9.0 (H) 0.0 - 2.0 mmol/L   Patient temperature 97.6 F    Collection site RADIAL, ALLEN'S TEST ACCEPTABLE    Drawn by Operator    Sample type ARTERIAL    Comment  NOTIFIED PHYSICIAN   Hemoglobin A1c     Status: Abnormal   Collection Time: 10/15/16  4:14 PM  Result Value Ref Range   Hgb A1c MFr Bld 6.9 (H) 4.8 - 5.6 %   Mean Plasma Glucose 151 mg/dL  Creatinine, serum     Status: Abnormal   Collection Time: 10/15/16  4:19 PM  Result Value Ref Range   Creatinine, Ser 2.27 (H) 0.44 - 1.00 mg/dL   GFR calc non Af Amer 20 (L) >60 mL/min   GFR calc Af Amer 24 (L) >60 mL/min  Troponin I (q 6hr x 3)     Status: Abnormal   Collection Time: 10/15/16  4:19 PM  Result Value Ref Range   Troponin I 0.93 (HH) <0.03 ng/mL  CBG monitoring, ED     Status: Abnormal   Collection Time: 10/15/16  4:35 PM  Result Value Ref Range   Glucose-Capillary 207 (H) 65 -  99 mg/dL  Glucose, capillary     Status: Abnormal   Collection Time: 10/15/16  6:20 PM  Result Value Ref Range   Glucose-Capillary 207 (H) 65 - 99 mg/dL   Comment 1 Notify RN    Comment 2 Document in Chart   MRSA PCR Screening     Status: None   Collection Time: 10/15/16  6:31 PM  Result Value Ref Range   MRSA by PCR NEGATIVE NEGATIVE  Glucose, capillary     Status: Abnormal   Collection Time: 10/15/16  8:03 PM  Result Value Ref Range   Glucose-Capillary 207 (H) 65 - 99 mg/dL   Comment 1 Notify RN    Comment 2 Document in Chart   Troponin I (q 6hr x 3)     Status: Abnormal   Collection Time: 10/15/16  9:21 PM  Result Value Ref Range   Troponin I 1.03 (HH) <0.03 ng/mL  Glucose, capillary     Status: Abnormal   Collection Time: 10/16/16 12:04 AM  Result Value Ref Range   Glucose-Capillary 199 (H) 65 - 99 mg/dL   Comment 1 Notify RN    Comment 2 Document in Chart   Troponin I (q 6hr x 3)     Status: Abnormal   Collection Time: 10/16/16  2:52 AM  Result Value Ref Range   Troponin I 1.00 (HH) <0.03 ng/mL  CBC     Status: Abnormal   Collection Time: 10/16/16  2:52 AM  Result Value Ref Range   WBC 7.8 4.0 - 10.5 K/uL   RBC 3.62 (L) 3.87 - 5.11 MIL/uL   Hemoglobin 9.3 (L) 12.0 - 15.0 g/dL   HCT  30.8 (L) 36.0 - 46.0 %   MCV 85.1 78.0 - 100.0 fL   MCH 25.7 (L) 26.0 - 34.0 pg   MCHC 30.2 30.0 - 36.0 g/dL   RDW 15.0 11.5 - 15.5 %   Platelets 303 150 - 400 K/uL  Basic metabolic panel     Status: Abnormal   Collection Time: 10/16/16  2:52 AM  Result Value Ref Range   Sodium 140 135 - 145 mmol/L   Potassium 2.3 (LL) 3.5 - 5.1 mmol/L   Chloride 98 (L) 101 - 111 mmol/L   CO2 30 22 - 32 mmol/L   Glucose, Bld 192 (H) 65 - 99 mg/dL   BUN 19 6 - 20 mg/dL   Creatinine, Ser 2.19 (H) 0.44 - 1.00 mg/dL   Calcium 9.4 8.9 - 10.3 mg/dL   GFR calc non Af Amer 21 (L) >60 mL/min   GFR calc Af Amer 25 (L) >60 mL/min   Anion gap 12 5 - 15  Magnesium     Status: None   Collection Time: 10/16/16  2:52 AM  Result Value Ref Range   Magnesium 2.1 1.7 - 2.4 mg/dL  Phosphorus     Status: None   Collection Time: 10/16/16  2:52 AM  Result Value Ref Range   Phosphorus 4.4 2.5 - 4.6 mg/dL  Blood gas, arterial     Status: Abnormal   Collection Time: 10/16/16  3:08 AM  Result Value Ref Range   FIO2 40.00    Delivery systems VENTILATOR    Mode PRESSURE REGULATED VOLUME CONTROL    VT 330 mL   LHR 12 resp/min   Peep/cpap 5.0 cm H20   pH, Arterial 7.399 7.350 - 7.450   pCO2 arterial 51.7 (H) 32.0 - 48.0 mmHg   pO2, Arterial 140 (H) 83.0 - 108.0  mmHg   Bicarbonate 31.6 (H) 20.0 - 28.0 mmol/L   Acid-Base Excess 6.7 (H) 0.0 - 2.0 mmol/L   O2 Saturation 98.6 %   Patient temperature 97.3    Collection site RIGHT RADIAL    Drawn by [phone]    Sample type ARTERIAL DRAW    Allens test (pass/fail) PASS PASS  Glucose, capillary     Status: Abnormal   Collection Time: 10/16/16  3:24 AM  Result Value Ref Range   Glucose-Capillary 166 (H) 65 - 99 mg/dL   Comment 1 Notify RN    Comment 2 Document in Chart   Glucose, capillary     Status: Abnormal   Collection Time: 10/16/16  7:47 AM  Result Value Ref Range   Glucose-Capillary 158 (H) 65 - 99 mg/dL   Comment 1 Notify RN    Comment 2 Document in Chart      SUBJECTIVE:  Discussed situation with pt and family members.  On schedule this afternoon.  OBJECTIVE:  Awake, alert.  Intubated.  IMPRESSION:  Subglottic stenosis  PLAN:  For DL, laser lysis stenosis, possible dilation, steroid injection to suppress scar formation later today.  [name]

## 2016-10-17 ENCOUNTER — Inpatient Hospital Stay (HOSPITAL_COMMUNITY): Payer: Medicare Other

## 2016-10-17 ENCOUNTER — Encounter (HOSPITAL_COMMUNITY): Payer: Self-pay | Admitting: Otolaryngology

## 2016-10-17 DIAGNOSIS — I481 Persistent atrial fibrillation: Secondary | ICD-10-CM

## 2016-10-17 DIAGNOSIS — I10 Essential (primary) hypertension: Secondary | ICD-10-CM

## 2016-10-17 DIAGNOSIS — I5032 Chronic diastolic (congestive) heart failure: Secondary | ICD-10-CM

## 2016-10-17 LAB — GLUCOSE, CAPILLARY
GLUCOSE-CAPILLARY: 154 mg/dL — AB (ref 65–99)
GLUCOSE-CAPILLARY: 125 mg/dL — AB (ref 65–99)
Glucose-Capillary: 141 mg/dL — ABNORMAL HIGH (ref 65–99)
Glucose-Capillary: 138 mg/dL — ABNORMAL HIGH (ref 65–99)
Glucose-Capillary: 152 mg/dL — ABNORMAL HIGH (ref 65–99)
Glucose-Capillary: 144 mg/dL — ABNORMAL HIGH (ref 65–99)

## 2016-10-17 LAB — CBC
HCT: 31.9 % — ABNORMAL LOW (ref 36.0–46.0)
Hemoglobin: 9.4 g/dL — ABNORMAL LOW (ref 12.0–15.0)
MCH: 25.6 pg — AB (ref 26.0–34.0)
MCHC: 29.5 g/dL — AB (ref 30.0–36.0)
MCV: 86.9 fL (ref 78.0–100.0)
PLATELETS: 335 10*3/uL (ref 150–400)
RBC: 3.67 MIL/uL — ABNORMAL LOW (ref 3.87–5.11)
RDW: 15.3 % (ref 11.5–15.5)
WBC: 15.2 10*3/uL — ABNORMAL HIGH (ref 4.0–10.5)

## 2016-10-17 LAB — BASIC METABOLIC PANEL
Anion gap: 10 (ref 5–15)
BUN: 25 mg/dL — AB (ref 6–20)
CALCIUM: 9.8 mg/dL (ref 8.9–10.3)
CHLORIDE: 107 mmol/L (ref 101–111)
CO2: 26 mmol/L (ref 22–32)
CREATININE: 2.15 mg/dL — AB (ref 0.44–1.00)
GFR calc non Af Amer: 22 mL/min — ABNORMAL LOW (ref >60)
GFR, EST AFRICAN AMERICAN: 25 mL/min — AB (ref >60)
GLUCOSE: 168 mg/dL — AB (ref 65–99)
Potassium: 3.6 mmol/L (ref 3.5–5.1)
Sodium: 143 mmol/L (ref 135–145)

## 2016-10-17 LAB — MAGNESIUM: MAGNESIUM: 2.2 mg/dL (ref 1.7–2.4)

## 2016-10-17 LAB — PHOSPHORUS: Phosphorus: 4.4 mg/dL (ref 2.5–4.6)

## 2016-10-17 MED ORDER — POLYETHYLENE GLYCOL 3350 17 G PO PACK
17.0000 g | PACK | Freq: Every day | ORAL | Status: DC
  Administered 2016-10-18 – 2016-11-04 (×9): 17 g via ORAL
  Filled 2016-10-17 (×14): qty 1

## 2016-10-17 MED ORDER — ASPIRIN EC 81 MG PO TBEC: 81.0000 mg | DELAYED_RELEASE_TABLET | ORAL | Status: DC

## 2016-10-17 MED ORDER — ALBUTEROL SULFATE (2.5 MG/3ML) 0.083% IN NEBU: 2.5000 mg | INHALATION_SOLUTION | Freq: Four times a day (QID) | RESPIRATORY_TRACT | Status: DC | PRN

## 2016-10-17 MED ORDER — PANTOPRAZOLE SODIUM 40 MG PO PACK
40.0000 mg | PACK | Freq: Every day | ORAL | Status: DC
  Administered 2016-10-17 – 2016-10-22 (×5): 40 mg
  Filled 2016-10-17 (×7): qty 20

## 2016-10-17 MED ORDER — FENTANYL CITRATE (PF) 100 MCG/2ML IJ SOLN
50.0000 ug | INTRAMUSCULAR | Status: DC | PRN
  Administered 2016-10-17: 50 ug via INTRAVENOUS
  Filled 2016-10-17 (×2): qty 2

## 2016-10-17 MED ORDER — ROSUVASTATIN CALCIUM 10 MG PO TABS
20.0000 mg | ORAL_TABLET | Freq: Every day | ORAL | Status: DC
  Administered 2016-10-18 – 2016-11-04 (×10): 20 mg via ORAL
  Filled 2016-10-17 (×6): qty 1
  Filled 2016-10-17: qty 2
  Filled 2016-10-17: qty 1
  Filled 2016-10-17: qty 2
  Filled 2016-10-17 (×2): qty 1
  Filled 2016-10-17: qty 2
  Filled 2016-10-17 (×3): qty 1
  Filled 2016-10-17 (×2): qty 2
  Filled 2016-10-17: qty 1

## 2016-10-17 MED ORDER — LORAZEPAM 2 MG/ML IJ SOLN
0.5000 mg | Freq: Four times a day (QID) | INTRAMUSCULAR | Status: DC | PRN
Start: 2016-10-17 — End: 2016-10-17

## 2016-10-17 MED ORDER — FUROSEMIDE 40 MG PO TABS
40.0000 mg | ORAL_TABLET | Freq: Every day | ORAL | Status: DC
  Filled 2016-10-17: qty 1

## 2016-10-17 MED ORDER — FERROUS SULFATE 325 (65 FE) MG PO TABS
325.0000 mg | ORAL_TABLET | Freq: Two times a day (BID) | ORAL | Status: DC
  Filled 2016-10-17: qty 1

## 2016-10-17 MED ORDER — FUROSEMIDE 40 MG PO TABS
40.0000 mg | ORAL_TABLET | Freq: Every day | ORAL | Status: DC
  Administered 2016-10-18 – 2016-10-22 (×4): 40 mg via ORAL
  Filled 2016-10-17 (×6): qty 1

## 2016-10-17 MED ORDER — PANTOPRAZOLE SODIUM 40 MG PO PACK
40.0000 mg | PACK | Freq: Two times a day (BID) | ORAL | Status: DC
  Filled 2016-10-17: qty 20

## 2016-10-17 MED ORDER — FUROSEMIDE 10 MG/ML IJ SOLN
40.0000 mg | Freq: Once | INTRAMUSCULAR | Status: AC
  Administered 2016-10-17: 40 mg via INTRAVENOUS
  Filled 2016-10-17: qty 4

## 2016-10-17 MED ORDER — POTASSIUM CHLORIDE 20 MEQ/15ML (10%) PO SOLN
40.0000 meq | Freq: Once | ORAL | Status: AC
  Administered 2016-10-17: 40 meq via ORAL
  Filled 2016-10-17: qty 30

## 2016-10-17 MED ORDER — FENTANYL CITRATE (PF) 100 MCG/2ML IJ SOLN: 50.0000 ug | Freq: Once | INTRAMUSCULAR | Status: DC

## 2016-10-17 MED ORDER — HYDRALAZINE HCL 20 MG/ML IJ SOLN
5.0000 mg | Freq: Four times a day (QID) | INTRAMUSCULAR | Status: DC
  Administered 2016-10-17 – 2016-10-21 (×14): 5 mg via INTRAVENOUS
  Filled 2016-10-17: qty 1
  Filled 2016-10-17: qty 0.25
  Filled 2016-10-17: qty 1
  Filled 2016-10-17 (×2): qty 0.25
  Filled 2016-10-17: qty 1
  Filled 2016-10-17 (×9): qty 0.25
  Filled 2016-10-17: qty 1
  Filled 2016-10-17: qty 0.25

## 2016-10-17 MED ORDER — FENTANYL BOLUS VIA INFUSION
25.0000 ug | INTRAVENOUS | Status: DC | PRN
  Administered 2016-10-18 – 2016-10-20 (×3): 25 ug via INTRAVENOUS
  Filled 2016-10-17: qty 25

## 2016-10-17 MED ORDER — FENTANYL 2500MCG IN NS 250ML (10MCG/ML) PREMIX INFUSION
25.0000 ug/h | INTRAVENOUS | Status: DC
  Administered 2016-10-17: 75 ug/h via INTRAVENOUS
  Administered 2016-10-18: 125 ug/h via INTRAVENOUS
  Administered 2016-10-19: 150 ug/h via INTRAVENOUS
  Administered 2016-10-19: 75 ug/h via INTRAVENOUS
  Filled 2016-10-17 (×4): qty 250

## 2016-10-17 MED ORDER — FUROSEMIDE 20 MG PO TABS: 20.0000 mg | ORAL_TABLET | ORAL | Status: DC

## 2016-10-17 MED ORDER — NITROGLYCERIN 0.4 MG SL SUBL: 0.4000 mg | SUBLINGUAL_TABLET | SUBLINGUAL | Status: DC | PRN

## 2016-10-17 MED ORDER — MIDAZOLAM HCL 2 MG/2ML IJ SOLN: 1.0000 mg | INTRAMUSCULAR | Status: DC | PRN

## 2016-10-17 MED ORDER — HYDRALAZINE HCL 10 MG PO TABS
10.0000 mg | ORAL_TABLET | Freq: Three times a day (TID) | ORAL | Status: DC
  Administered 2016-10-17: 10 mg via ORAL
  Filled 2016-10-17 (×3): qty 1

## 2016-10-17 MED ORDER — FENTANYL CITRATE (PF) 100 MCG/2ML IJ SOLN
50.0000 ug | INTRAMUSCULAR | Status: AC | PRN
  Administered 2016-10-17 (×3): 50 ug via INTRAVENOUS
  Filled 2016-10-17: qty 2

## 2016-10-17 MED ORDER — BISACODYL 10 MG RE SUPP
10.0000 mg | Freq: Once | RECTAL | Status: AC
  Administered 2016-10-17: 10 mg via RECTAL
  Filled 2016-10-17: qty 1

## 2016-10-17 MED ORDER — FERROUS SULFATE 325 (65 FE) MG PO TABS
325.0000 mg | ORAL_TABLET | Freq: Two times a day (BID) | ORAL | Status: DC
  Administered 2016-10-17: 325 mg via ORAL
  Filled 2016-10-17 (×2): qty 1

## 2016-10-17 NOTE — Progress Notes (Signed)
Patient Name: [name] Date of Encounter: 10/17/2016  Primary Cardiologist: Jonelle Sidle C. Oval Linsey, MD, [facility] Problem List     Principal Problem:   Respiratory failure [facility]) Active Problems:   Tracheal stenosis     Subjective   Intubated.  Agitated when awake but denies pain.   Inpatient Medications    Scheduled Meds: . aspirin  81 mg Per Tube Daily  . chlorhexidine gluconate (MEDLINE KIT)  15 mL Mouth Rinse BID  . dexamethasone  4 mg Intravenous Q6H  . famotidine (PEPCID) IV  20 mg Intravenous Q24H  . ferrous sulfate  325 mg Oral BID WC  . [START ON 10/18/2016] furosemide  40 mg Oral Daily  . heparin subcutaneous  5,000 Units Subcutaneous Q8H  . insulin aspart  0-9 Units Subcutaneous Q4H  . mouth rinse  15 mL Mouth Rinse QID  . [START ON 10/18/2016] polyethylene glycol  17 g Oral Daily  . potassium chloride  40 mEq Per Tube Once  . rosuvastatin  20 mg Oral Daily   Continuous Infusions: . dexmedetomidine 0.2 mcg/kg/hr (10/17/16 0757)   PRN Meds: sodium chloride, albuterol, fentaNYL (SUBLIMAZE) injection, fentaNYL (SUBLIMAZE) injection, midazolam, nitroGLYCERIN   Vital Signs    Vitals:   10/17/16 0755 10/17/16 0800 10/17/16 0813 10/17/16 1128  BP:  (!) 143/106 (!) 143/106   Pulse:  74 75   Resp:  14 17   Temp: 97.6 F (36.4 C)   97.5 F (36.4 C)  TempSrc: Oral   Oral  SpO2:  100% 100%   Weight:      Height:        Intake/Output Summary (Last 24 hours) at 10/17/16 1134 Last data filed at 10/17/16 1000  Gross per 24 hour  Intake          2241.42 ml  Output              780 ml  Net          1461.42 ml   Filed Weights   10/15/16 1800 10/16/16 0435 10/17/16 0417  Weight: 68.6 kg (151 lb 3.8 oz) 71.5 kg (157 lb 10.1 oz) 74.8 kg (164 lb 14.5 oz)    Physical Exam    GEN: Well nourished, well developed.  Intubated.  HEENT: Grossly normal.  Neck: Supple, no JVD, carotid bruits, or masses. Cardiac: RRR, no murmurs, rubs, or gallops. No  clubbing, cyanosis, 1+ pitting edema to the ankles.  Radials/DP/PT 2+ and equal bilaterally.  Respiratory:  Vented breath sounds.  GI: Soft, nontender, nondistended, BS + x 4. MS: no deformity or atrophy. Skin: warm and dry, no rash. Neuro:  Strength and sensation are intact. Psych: AAOx3.  Normal affect.  Labs    CBC  Recent Labs  10/15/16 1040 10/16/16 0252 10/17/16 0219  WBC 12.9* 7.8 15.2*  NEUTROABS 5.9  --   --   HGB 10.5* 9.3* 9.4*  HCT 34.4* 30.8* 31.9*  MCV 87.3 85.1 86.9  PLT 363 303 641   Basic Metabolic Panel  Recent Labs  10/16/16 0252 10/16/16 1114 10/17/16 0219  NA 140  --  143  K 2.3* 4.1 3.6  CL 98*  --  107  CO2 30  --  26  GLUCOSE 192*  --  168*  BUN 19  --  25*  CREATININE 2.19*  --  2.15*  CALCIUM 9.4  --  9.8  MG 2.1  --  2.2  PHOS 4.4  --  4.4  Liver Function Tests  Recent Labs  10/15/16 1040  AST 30  ALT 9*  ALKPHOS 54  BILITOT 0.5  PROT 7.3  ALBUMIN 3.4*   No results for input(s): LIPASE, AMYLASE in the last 72 hours. Cardiac Enzymes  Recent Labs  10/15/16 1619 10/15/16 2121 10/16/16 0252  TROPONINI 0.93* 1.03* 1.00*   BNP Invalid input(s): POCBNP D-Dimer No results for input(s): DDIMER in the last 72 hours. Hemoglobin A1C  Recent Labs  10/15/16 1614  HGBA1C 6.9*   Fasting Lipid Panel No results for input(s): CHOL, HDL, LDLCALC, TRIG, CHOLHDL, LDLDIRECT in the last 72 hours. Thyroid Function Tests No results for input(s): TSH, T4TOTAL, T3FREE, THYROIDAB in the last 72 hours.  Invalid input(s): FREET3  Telemetry    VP.  Occasional PVCs.  - Personally Reviewed  ECG    n/a - Personally Reviewed  Radiology    Dg Chest Port 1 View  Result Date: 10/17/2016 CLINICAL DATA:  Hypoxia EXAM: PORTABLE CHEST 1 VIEW COMPARISON:  October 16, 2016 FINDINGS: Endotracheal tube tip is 1.2 cm above the carina. Pacemaker leads attached to right atrium and right ventricle. No pneumothorax. There is no edema or  consolidation. Heart is mildly enlarged with pulmonary vascularity within normal limits. No adenopathy. There is aortic atherosclerosis. IMPRESSION: Endotracheal tube tip is 1.2 cm above the carina. It may be prudent to consider withdrawing endotracheal tube approximately 2 cm. No edema or consolidation. Aortic atherosclerosis. Pacemaker present with lead tips attached to right atrium and right ventricle. Electronically Signed   By: Lowella Grip III M.D.   On: 10/17/2016 10:07   Dg Chest Port 1 View  Result Date: 10/16/2016 CLINICAL DATA:  Respiratory failure. EXAM: PORTABLE CHEST 1 VIEW COMPARISON:  10/15/2016 FINDINGS: Endotracheal tube is 3.3 cm above the carina. Nasogastric tube extends to the abdomen and appears to extend into the proximal duodenum. Stable position of the right cardiac pacemaker leads. Heart size is mildly enlarged. Densities near the left costophrenic angle could represent atelectasis or small effusion. No evidence for pulmonary edema. Negative for a pneumothorax. IMPRESSION: Increased left basilar densities. This could represent atelectasis or small effusion. Support apparatuses as described. Electronically Signed   By: Markus Daft M.D.   On: 10/16/2016 07:10   Dg Chest Portable 1 View  Result Date: 10/15/2016 CLINICAL DATA:  Intubation. Asthma. Congestive heart failure. Atrial fibrillation. EXAM: PORTABLE CHEST 1 VIEW COMPARISON:  10/15/2016 FINDINGS: Endotracheal tube tip 3.5 cm above the carina. Nasogastric tube enters the stomach. Dual lead pacer noted. Mild enlargement of the cardiopericardial silhouette. Upper zone pulmonary vascular prominence could be from supine positioning. Linear bandlike density in the left retrocardiac region potentially from atelectasis. IMPRESSION: 1. Tubes and lines satisfactorily position. 2. Mild enlargement of the cardiopericardial silhouette, without overt edema. 3. Left retrocardiac subsegmental atelectasis. Electronically Signed   By: Van Clines M.D.   On: 10/15/2016 13:03   Dg Chest Portable 1 View  Result Date: 10/15/2016 CLINICAL DATA:  Respiratory distress, shortness of Breath EXAM: PORTABLE CHEST 1 VIEW COMPARISON:  08/17/2016 FINDINGS: Right pacer remains in place, unchanged. Cardiomegaly. No confluent opacities or effusions. No acute bony abnormality. IMPRESSION: Cardiomegaly.  No active disease. Electronically Signed   By: Rolm Baptise M.D.   On: 10/15/2016 12:13    Cardiac Studies   Echo 10/16/16: Study Conclusions  - Left ventricle: The cavity size was normal. Wall thickness was   increased in a pattern of mild LVH. There was moderate concentric   hypertrophy. Systolic  function was normal. The estimated ejection   fraction was in the range of 55% to 60%. There was no dynamic   obstruction. Wall motion was normal; there were no regional wall   motion abnormalities. The study is not technically sufficient to   allow evaluation of LV diastolic function. - Ventricular septum: Septal motion showed abnormal function,   dyssynergy, and paradox. These changes are consistent with right   ventricular pacing. - Mitral valve: There was mild to moderate regurgitation directed   centrally. - Left atrium: The atrium was moderately to severely dilated. - Pulmonary arteries: Systolic pressure was mildly increased. PA   peak pressure: 40 mm Hg (S).  Patient Profile     15F well-known to me with a history of hypertension, hyperlipidemia, hypertrophic cardiomyopathy, longstanding persistent AF, GI bleeding not on anticoagulation, CHB s/p PPM, CKD III, and COPD here with respiratory arrest requiring intubation.  Assessment & Plan    # Upper airway obstruction: Ms. Suh was taken to the OR yesterday for laser of the granulation tissue and subglottic ablation.  She is receiving IV steroids and plan for attempted extubation Monday.  She may require trach if this fails.   # Demand ischemia: Troponin elevated to  0.57.  Cath 08/2016 with normal coronaries.   # Chronic diastolic dysfunction:  Ms. Foronda is mildly volume overloaded.  Agree with IV lasix today and resuming her home lasix tomorrow.  # Chronic atrial fibrillation: Rates controlled.  Not on anticoagulation 2/2 GIB.  # Hypertension: Diastolic blood pressure is elevated.  She isn't on any antihypertensives at baseline.  She is agitated and wants her mitts off.  She is on precedex which can lower BP.  We will give hydralazine 55m VT q8h.  Signed, TSkeet Latch MD  10/17/2016, 11:34 AM

## 2016-10-17 NOTE — Progress Notes (Signed)
PULMONARY / CRITICAL CARE MEDICINE   Name: [name] MRN: [id number] DOB: 11/01/1946    ADMISSION DATE:  10/15/2016  REFERRING MD:  Lita Mains - EDP.  CHIEF COMPLAINT:  Dyspnea, stridor   Brief:    70yo female with multiple medical problems including dCHF, GERD, hx ETOH, AFib, CKD, angioedema with chronic upper airway narrowing after an intubation with recent laryngoscopy by ENT with removal of granulation tissue 3 weeks prior to admit but has continued to have dyspnea and stridor.  She presented 11/1 to ER with ongoing dyspnea and stridor.  She initially improved with steroids, BD and ativan and was seen by ENT who scheduled direct laryngoscopy for 11/2 but wanted cardiology clearance first r/t troponin 0.57.  Over time she became increasingly stridorous, tripoding, diaphoretic and required intubation in ER. PCCM called to admit.    STUDIES:  CXR 11/1 >  ETT in good position. CXR 11/2 : Increased left basilar densities. This could represent atelectasis or small effusion. Echo 11/3: EF 29-52%, Systolic pressure was mildly increased. PA peak pressure: 40 mm Hg (S).  CULTURES: None.  ANTIBIOTICS: None.  SIGNIFICANT EVENTS: 11/1 > admitted with stridor / respiratory distress requiring intubation. 11/2 > MICRODIRECTLARYNGOSCOPY WITH LASER ABLATION AND KENLOG INJECTION   LINES/TUBES: ETT 11/1 >11/2 ETT 11/2>   Subjective: Went for ENT ablation. Extubated after. Had to be re-intubated immediately after. Today alert on vent.   PAST MEDICAL HISTORY :  She  has a past medical history of Anemia; Angina; Angioedema; Arteriovenous malformation of stomach; Arthritis; Asthma; AVM (arteriovenous malformation) of colon; Blood transfusion; CHF (congestive heart failure) ([place]); Complete heart block ([place]); Complication of anesthesia; DDD (degenerative disc disease); Depression; Diastolic heart failure; Fatty liver (07/26/10); GERD (gastroesophageal reflux disease); GI bleed; Headache;  History of alcohol abuse (Stopped Fall 2012); History of tobacco use (Quit Fall 2012); cardiovascular stress test; colonic polyp (08/13/10); colonoscopy; Hyperlipidemia; Hyperlipidemia; Hypertension; Hypertrophic cardiomyopathy ([place]); Iron deficiency anemia; Panic attack; Panic attacks; Permanent atrial fibrillation ([place]); Pneumonia; Renal failure; Right arm pain (01/08/2012); RLS (restless legs syndrome); Shortness of breath; and Sleep apnea.  PAST SURGICAL HISTORY: She  has a past surgical history that includes Tubal ligation (04/01/2000); Pacemaker insertion (1998); Cardiac catheterization; Esophagogastroduodenoscopy (12/23/2011); Givens capsule study (12/23/2011); Extubation (endotracheal) in or (N/A, 07/21/2016); Cardiac catheterization (N/A, 08/19/2016); and Direct laryngoscopy (N/A, 09/18/2016).  Allergies  Allergen Reactions  . Ace Inhibitors Other (See Comments)    Angioedema  . Other Anaphylaxis and Swelling    Unnamed anesthesia  . Propofol Anaphylaxis and Swelling    No current facility-administered medications on file prior to encounter.    Current Outpatient Prescriptions on File Prior to Encounter  Medication Sig  . acetaminophen (TYLENOL) 325 MG tablet Take 325 mg by mouth daily as needed (pain).  [name] albuterol (PROVENTIL) (2.5 MG/3ML) 0.083% nebulizer solution Take 3 mLs (2.5 mg total) by nebulization every 6 (six) hours as needed for wheezing or shortness of breath.  . ALPRAZolam (XANAX) 0.5 MG tablet Take 1 tablet (0.5 mg total) by mouth at bedtime as needed for anxiety.  [name] aspirin EC 81 MG tablet Take 81 mg by mouth every morning.   . bisacodyl (DULCOLAX) 10 MG suppository Place 10 mg rectally daily as needed for mild constipation.   [name] CALCIUM PO Take 1 tablet by mouth once a week.   . ferrous sulfate 325 (65 FE) MG tablet TAKE 1 TABLET BY MOUTH TWICE DAILY WITH MEALS TO KEEP BLOOD COUNT UP (Patient taking differently: Take 325 mg by  mouth 2 (two) times daily with a meal. TO KEEP BLOOD  COUNT UP)  . furosemide (LASIX) 20 MG tablet Take 20 mg by mouth as directed. TAKE 1 TABLET BY MOUTH AS NEEDED FOR WEIGHT GAIN (2 LBS IN 24 HRS OR 5 LBS IN 1 WEEK)  . nitroGLYCERIN (NITROSTAT) 0.4 MG SL tablet Place 1 tablet (0.4 mg total) under the tongue every 5 (five) minutes as needed for chest pain.  [name] omeprazole (PRILOSEC) 20 MG capsule Take 1 capsule (20 mg total) by mouth daily.  . polyethylene glycol (MIRALAX / GLYCOLAX) packet Take 17 g by mouth daily.  . rosuvastatin (CRESTOR) 20 MG tablet Take 1 tablet (20 mg total) by mouth daily.  [name] senna (SENOKOT) 8.6 MG tablet Take 1 tablet by mouth at bedtime as needed for constipation.   . trolamine salicylate (ASPERCREME) 10 % cream Apply 1 application topically 2 (two) times daily as needed for muscle pain.     FAMILY HISTORY:  Her indicated that her mother is deceased. She indicated that her father is deceased.    SOCIAL HISTORY: She  reports that she quit smoking about 5 years ago. Her smoking use included Cigarettes. She has a 5.00 pack-year smoking history. She has never used smokeless tobacco. She reports that she drinks alcohol. She reports that she does not use drugs.  REVIEW OF SYSTEMS:   Unable, sedated on vent.  As per HPI above obtained from records and family.   SUBJECTIVE:   On vent, agitated.  VITAL SIGNS: BP (!) 143/106   Pulse 75   Temp 97.6 F (36.4 C) (Oral)   Resp 17   Ht 5\' 2"  (1.575 m)   Wt 164 lb 14.5 oz (74.8 kg)   SpO2 100%   BMI 30.16 kg/m   HEMODYNAMICS:    VENTILATOR SETTINGS: Vent Mode: PSV;CPAP FiO2 (%):  [40 %] 40 % Set Rate:  [12 bmp] 12 bmp Vt Set:  [330 mL] 330 mL PEEP:  [5 cmH20] 5 cmH20 Pressure Support:  [8 cmH20] 8 cmH20 Plateau Pressure:  [15 cmH20-20 cmH20] 15 cmH20  INTAKE / OUTPUT: I/O last 3 completed shifts: In: 3803.3 [I.V.:3493.3; NG/GT:60; IV Piggyback:250] Out: 855 [Urine:825; Blood:30]  PHYSICAL EXAMINATION: General: AA female, resting comfortably on vent. Neuro:  On vent, awake, MAE's. HEENT: [place]/AT. PERRL, sclerae anicteric. Cardiovascular: RRR, no M/R/G.  Lungs: Respirations even and unlabored.  Clear, diminished breath sounds bilaterally, No W/R/R. No stridor. Abdomen: BS x 4, soft, NT/ND.  Musculoskeletal: No gross deformities, no edema.  Skin: Intact, warm, no rashes.   LABS:  BMET  Recent Labs Lab 10/15/16 1040 10/15/16 1619 10/16/16 0252 10/16/16 1114 10/17/16 0219  NA 140  --  140  --  143  K 2.4*  --  2.3* 4.1 3.6  CL 93*  --  98*  --  107  CO2 32  --  30  --  26  BUN 14  --  19  --  25*  CREATININE 2.07* 2.27* 2.19*  --  2.15*  GLUCOSE 180*  --  192*  --  168*    Electrolytes  Recent Labs Lab 10/15/16 1040 10/15/16 1207 10/16/16 0252 10/17/16 0219  CALCIUM 9.9  --  9.4 9.8  MG  --  2.0 2.1 2.2  PHOS  --   --  4.4 4.4    CBC  Recent Labs Lab 10/15/16 1040 10/16/16 0252 10/17/16 0219  WBC 12.9* 7.8 15.2*  HGB 10.5* 9.3* 9.4*  HCT 34.4* 30.8* 31.9*  PLT 363 303 335    Coag's No results for input(s): APTT, INR in the last 168 hours.  Sepsis Markers No results for input(s): LATICACIDVEN, PROCALCITON, O2SATVEN in the last 168 hours.  ABG  Recent Labs Lab 10/15/16 1331 10/15/16 1541 10/16/16 0308  PHART 7.431 7.624* 7.399  PCO2ART 52.4* 29.8* 51.7*  PO2ART 35.0* 141.0* 140*    Liver Enzymes  Recent Labs Lab 10/15/16 1040  AST 30  ALT 9*  ALKPHOS 54  BILITOT 0.5  ALBUMIN 3.4*    Cardiac Enzymes  Recent Labs Lab 10/15/16 1619 10/15/16 2121 10/16/16 0252  TROPONINI 0.93* 1.03* 1.00*    Glucose  Recent Labs Lab 10/16/16 0747 10/16/16 1132 10/16/16 2010 10/16/16 2335 10/17/16 0314 10/17/16 0749  GLUCAP 158* 154* 169* 153* 154* 141*    Imaging No results found.  ASSESSMENT / PLAN:  PULMONARY A: Acute hypoxic respiratory failure - due to stridor from laryngeal stenosis following intubation s/p laryngoscopy with laser ablation  Hx OSA, Former smoker. P:   Full vent  support. Wean as able. Chest Xray today  VAP prevention measures. ENT following. May eventually require trach. Albuterol PRN. Decadron scheduled.  CARDIOVASCULAR A:  Troponin leak - consider demand - down-trending  dCHF - RHC and LHC 08/19/16 with normal coronary arteries, severe diastolic dysfunction or significant MR, normal LV. Hx HCM, HTN, HLD P:  Cardiology consulted by EDP. Continue ASA, rosuvastatin. Lasix 24meq today  Continue home furosemide 11/4  RENAL A:   Hypokalemia - s/p repletion  AoCKD (Admission Cret 2.07/ BUN 14) P:   Replace potassium  Electrolyte replacement as needed  D/C Fluids  Trend BMP   GASTROINTESTINAL A:   GI prophylaxis. Nutrition. P:   SUP: Famotidine. NPO. Place Cortrack   HEMATOLOGIC A:   VTE Prophylaxis. P:  SCD's  SQ Heparin  CBC in AM.  INFECTIOUS A:   Leukocytosis (current steroid regimen)  P:   Monitor clinically. Trend WBC and Fever Curve   ENDOCRINE A:   Hyperglycemia - no hx DM. P:   SSI. q4H glucose checks   NEUROLOGIC A:   Acute encephalopathy. Hx RLS, panic attacks, ETOH abuse (quit 2012), DDD. P:   Precedex gtt wean as tolerated  Fentanyl prn  RASS goal: 0 to -1. Daily WUA. Hold preadmission alprazolam.  Family updated: Daughter at bedside update on plan for today.   Interdisciplinary Family Meeting v Palliative Care Meeting:  Due by: 10/21/16.  [name], AG-ACNP [facility]  Pgr: [phone]  PCCM Pgr: [phone]

## 2016-10-17 NOTE — Progress Notes (Signed)
10/17/2016 12:12 PM  Seifert-Polk, Sheretha [id number]  Post-Op Day 1    Temp:  [97.4 F (36.3 C)-97.6 F (36.4 C)] 97.5 F (36.4 C) (11/03 1128) Pulse Rate:  [70-75] 70 (11/03 1209) Resp:  [5-17] 15 (11/03 1209) BP: (126-174)/(79-108) 174/97 (11/03 1209) SpO2:  [100 %] 100 % (11/03 1209) FiO2 (%):  [40 %] 40 % (11/03 1209) Weight:  [74.8 kg (164 lb 14.5 oz)] 74.8 kg (164 lb 14.5 oz) (11/03 0417),     Intake/Output Summary (Last 24 hours) at 10/17/16 1212 Last data filed at 10/17/16 1000  Gross per 24 hour  Intake          2241.42 ml  Output              765 ml  Net          1476.42 ml    Results for orders placed or performed during the hospital encounter of 10/15/16 (from the past 24 hour(s))  Glucose, capillary     Status: Abnormal   Collection Time: 10/16/16  8:10 PM  Result Value Ref Range   Glucose-Capillary 169 (H) 65 - 99 mg/dL   Comment 1 Notify RN    Comment 2 Document in Chart   Glucose, capillary     Status: Abnormal   Collection Time: 10/16/16 11:35 PM  Result Value Ref Range   Glucose-Capillary 153 (H) 65 - 99 mg/dL   Comment 1 Notify RN    Comment 2 Document in Chart   Magnesium     Status: None   Collection Time: 10/17/16  2:19 AM  Result Value Ref Range   Magnesium 2.2 1.7 - 2.4 mg/dL  Phosphorus     Status: None   Collection Time: 10/17/16  2:19 AM  Result Value Ref Range   Phosphorus 4.4 2.5 - 4.6 mg/dL  Basic metabolic panel     Status: Abnormal   Collection Time: 10/17/16  2:19 AM  Result Value Ref Range   Sodium 143 135 - 145 mmol/L   Potassium 3.6 3.5 - 5.1 mmol/L   Chloride 107 101 - 111 mmol/L   CO2 26 22 - 32 mmol/L   Glucose, Bld 168 (H) 65 - 99 mg/dL   BUN 25 (H) 6 - 20 mg/dL   Creatinine, Ser 2.15 (H) 0.44 - 1.00 mg/dL   Calcium 9.8 8.9 - 10.3 mg/dL   GFR calc non Af Amer 22 (L) >60 mL/min   GFR calc Af Amer 25 (L) >60 mL/min   Anion gap 10 5 - 15  CBC     Status: Abnormal   Collection Time: 10/17/16  2:19 AM  Result Value  Ref Range   WBC 15.2 (H) 4.0 - 10.5 K/uL   RBC 3.67 (L) 3.87 - 5.11 MIL/uL   Hemoglobin 9.4 (L) 12.0 - 15.0 g/dL   HCT 31.9 (L) 36.0 - 46.0 %   MCV 86.9 78.0 - 100.0 fL   MCH 25.6 (L) 26.0 - 34.0 pg   MCHC 29.5 (L) 30.0 - 36.0 g/dL   RDW 15.3 11.5 - 15.5 %   Platelets 335 150 - 400 K/uL  Glucose, capillary     Status: Abnormal   Collection Time: 10/17/16  3:14 AM  Result Value Ref Range   Glucose-Capillary 154 (H) 65 - 99 mg/dL   Comment 1 Notify RN    Comment 2 Document in Chart   Glucose, capillary     Status: Abnormal   Collection Time: 10/17/16  7:49 AM  Result Value Ref Range   Glucose-Capillary 141 (H) 65 - 99 mg/dL   Comment 1 Notify RN    Comment 2 Document in Chart   Glucose, capillary     Status: Abnormal   Collection Time: 10/17/16 11:26 AM  Result Value Ref Range   Glucose-Capillary 138 (H) 65 - 99 mg/dL   Comment 1 Notify RN    Comment 2 Document in Chart     SUBJECTIVE:  Agitated.  Ventilating well.  OBJECTIVE:   No change in exam.    IMPRESSION:  Satisfactory check.  Subglottic stenosis with inadequate airway  PLAN:  Will extubate in OR Monday, scheduled for 1145.   Direct laryngoscopy, possible tracheostomy at that time.  Discussed with patient and family.  Orders written.    [name]

## 2016-10-17 NOTE — Progress Notes (Signed)
[facility] Progress Note Patient Name: [name] DOB: 1946-06-27 MRN: [id number]   Date of Service  10/17/2016  HPI/Events of Note    eICU Interventions  Add fentanyl prn     Intervention Category Intermediate Interventions: Pain - evaluation and management  BYRUM,ROBERT S. 10/17/2016, 12:18 AM

## 2016-10-17 NOTE — Progress Notes (Signed)
Core track placed.

## 2016-10-17 NOTE — Progress Notes (Signed)
Cortrak tube placed to 98cm in patients right nare. Pt tolerated poorly and was tense throughout the insertion. Xray ordered and tube added to pt assessment. Please contact the cortrak team with any question or concerns. Please retain the tube and stylet for reinsertion if needed.

## 2016-10-17 NOTE — Anesthesia Postprocedure Evaluation (Signed)
Anesthesia Post Note  Patient: [name]  Procedure(s) Performed: Procedure(s) (LRB): MICRODIRECTLARYNGOSCOPY WITH LASER ABLATION AND KENLOG INJECTION (N/A)  Patient location during evaluation: ICU Anesthesia Type: General Level of consciousness: sedated and patient remains intubated per anesthesia plan Pain management: pain level controlled Vital Signs Assessment: post-procedure vital signs reviewed and stable Respiratory status: patient remains intubated per anesthesia plan and respiratory function unstable Cardiovascular status: blood pressure returned to baseline and stable Anesthetic complications: no Comments: Patient jet ventilated throughout procedure using TIVA. Attempt to awaken patient at end of procedure and have her breathe spontaneously unsuccessful. Patient had to be reintubated due to desaturation which was probably a combination of airway edema and laryngospasm. Taken back to ICU intubated.                   Marri Mcneff A.

## 2016-10-17 NOTE — Consult Note (Addendum)
   [facility] CM Inpatient Consult   10/17/2016  [name] [birth year] [id number]    Patient is currently active with [facility] for chronic disease management services.  Patient has been engaged by a [employer].   Our community based plan of care has focused on disease management and community resource support.  Active consent on file.  Chart review reveals the patient is a 70yo female with multiple medical problems including dCHF, GERD, hx ETOH, AFib, CKD, angioedema with chronic upper airway narrowing after an intubation with recent laryngoscopy by ENT with removal of granulation tissue 3 weeks prior to admit but has continued to have dyspnea and stridor.  She presented 11/1 to ER with ongoing dyspnea and stridor.  She initially improved with steroids, and required intubation in ER per MD notes.   Will follow for progress and disposition and appropriate care management needs.  Of note, [facility] Care Management services does not replace or interfere with any services that are needed or arranged by inpatient case management or social work.  For additional questions or referrals please contact:   Natividad Brood, RN BSN [facility] Liaison  [phone] business mobile phone Toll free office [phone]

## 2016-10-18 LAB — BASIC METABOLIC PANEL
ANION GAP: 10 (ref 5–15)
BUN: 39 mg/dL — ABNORMAL HIGH (ref 6–20)
CALCIUM: 9.8 mg/dL (ref 8.9–10.3)
CHLORIDE: 106 mmol/L (ref 101–111)
CO2: 29 mmol/L (ref 22–32)
Creatinine, Ser: 2.17 mg/dL — ABNORMAL HIGH (ref 0.44–1.00)
GFR calc non Af Amer: 22 mL/min — ABNORMAL LOW (ref >60)
GFR, EST AFRICAN AMERICAN: 25 mL/min — AB (ref >60)
Glucose, Bld: 130 mg/dL — ABNORMAL HIGH (ref 65–99)
Potassium: 3.6 mmol/L (ref 3.5–5.1)
SODIUM: 145 mmol/L (ref 135–145)

## 2016-10-18 LAB — GLUCOSE, CAPILLARY
GLUCOSE-CAPILLARY: 129 mg/dL — AB (ref 65–99)
GLUCOSE-CAPILLARY: 140 mg/dL — AB (ref 65–99)
GLUCOSE-CAPILLARY: 192 mg/dL — AB (ref 65–99)
Glucose-Capillary: 154 mg/dL — ABNORMAL HIGH (ref 65–99)
Glucose-Capillary: 147 mg/dL — ABNORMAL HIGH (ref 65–99)

## 2016-10-18 LAB — CBC
HCT: 33.5 % — ABNORMAL LOW (ref 36.0–46.0)
HEMOGLOBIN: 10.1 g/dL — AB (ref 12.0–15.0)
MCH: 26 pg (ref 26.0–34.0)
MCHC: 30.1 g/dL (ref 30.0–36.0)
MCV: 86.1 fL (ref 78.0–100.0)
PLATELETS: 353 10*3/uL (ref 150–400)
RBC: 3.89 MIL/uL (ref 3.87–5.11)
RDW: 15.6 % — ABNORMAL HIGH (ref 11.5–15.5)
WBC: 12 10*3/uL — AB (ref 4.0–10.5)

## 2016-10-18 LAB — MAGNESIUM: MAGNESIUM: 2.2 mg/dL (ref 1.7–2.4)

## 2016-10-18 LAB — PHOSPHORUS: Phosphorus: 4.2 mg/dL (ref 2.5–4.6)

## 2016-10-18 MED ORDER — POTASSIUM CHLORIDE 20 MEQ/15ML (10%) PO SOLN
40.0000 meq | Freq: Once | ORAL | Status: AC
  Administered 2016-10-18: 40 meq
  Filled 2016-10-18: qty 30

## 2016-10-18 MED ORDER — FERROUS SULFATE 300 (60 FE) MG/5ML PO SYRP
300.0000 mg | ORAL_SOLUTION | Freq: Two times a day (BID) | ORAL | Status: DC
Start: 2016-10-18 — End: 2016-10-21
  Administered 2016-10-18 – 2016-10-20 (×4): 300 mg
  Filled 2016-10-18 (×8): qty 5

## 2016-10-18 MED ORDER — VITAL HIGH PROTEIN PO LIQD: 1000.0000 mL | ORAL | Status: DC

## 2016-10-18 MED ORDER — SODIUM CHLORIDE 0.9 % IV SOLN
INTRAVENOUS | Status: DC
  Administered 2016-10-18: 13:00:00 via INTRAVENOUS

## 2016-10-18 MED ORDER — INFLUENZA VAC SPLIT QUAD 0.5 ML IM SUSY
0.5000 mL | PREFILLED_SYRINGE | INTRAMUSCULAR | Status: AC
  Administered 2016-10-19: 0.5 mL via INTRAMUSCULAR
  Filled 2016-10-18: qty 0.5

## 2016-10-18 MED ORDER — PRO-STAT SUGAR FREE PO LIQD
30.0000 mL | Freq: Two times a day (BID) | ORAL | Status: DC
  Administered 2016-10-18 – 2016-10-22 (×9): 30 mL
  Filled 2016-10-18 (×16): qty 30

## 2016-10-18 MED ORDER — VITAL AF 1.2 CAL PO LIQD
1000.0000 mL | ORAL | Status: DC
  Administered 2016-10-18 – 2016-10-21 (×4): 1000 mL
  Filled 2016-10-18 (×3): qty 1000

## 2016-10-18 NOTE — Progress Notes (Addendum)
Patient Name: [name] Date of Encounter: 10/18/2016  Hospital Problem List     Principal Problem:   Respiratory failure [facility]) Active Problems:   Tracheal stenosis    Patient Profile     18F with a history of hypertension, hyperlipidemia, hypertrophic cardiomyopathy, longstanding persistent AF, GI bleeding not on anticoagulation, CHB s/p PPM, CKD III, and COPD here with respiratory arrest requiring intubation.  Subjective   Awake and alert.  Denies pain .  Intubated but opens eyes.    Inpatient Medications    . aspirin  81 mg Per Tube Daily  . chlorhexidine gluconate (MEDLINE KIT)  15 mL Mouth Rinse BID  . dexamethasone  4 mg Intravenous Q6H  . fentaNYL (SUBLIMAZE) injection  50 mcg Intravenous Once  . ferrous sulfate  300 mg Per Tube BID WC  . furosemide  40 mg Oral Daily  . heparin subcutaneous  5,000 Units Subcutaneous Q8H  . hydrALAZINE  5 mg Intravenous Q6H  . insulin aspart  0-9 Units Subcutaneous Q4H  . mouth rinse  15 mL Mouth Rinse QID  . pantoprazole sodium  40 mg Per Tube Daily  . polyethylene glycol  17 g Oral Daily  . rosuvastatin  20 mg Oral Daily    Vital Signs    Vitals:   10/18/16 0740 10/18/16 0753 10/18/16 0800 10/18/16 0900  BP:   (!) 156/85 (!) 148/87  Pulse:   74 74  Resp:   13 14  Temp:  98.9 F (37.2 C)    TempSrc:  Oral    SpO2: 100%  100% 100%  Weight:      Height:        Intake/Output Summary (Last 24 hours) at 10/18/16 1038 Last data filed at 10/18/16 1000  Gross per 24 hour  Intake           363.02 ml  Output             2520 ml  Net         -2156.98 ml   Filed Weights   10/16/16 0435 10/17/16 0417 10/18/16 0427  Weight: 157 lb 10.1 oz (71.5 kg) 164 lb 14.5 oz (74.8 kg) 162 lb 11.2 oz (73.8 kg)    Physical Exam    GEN: Well nourished, well developed, in  no acute distress.  Neck: Supple, no JVD, carotid bruits, or masses. Cardiac: RRR, no rubs, or gallops. No clubbing, cyanosis, no edema.  Radials/DP/PT 2+  and equal bilaterally.  Respiratory:  Respirations no regular and unlabored, clear to auscultation bilaterally. GI: Soft, nontender, nondistended, BS + x 4. Neuro:  Strength and sensation are intact.   Labs    CBC  Recent Labs  10/15/16 1040  10/17/16 0219 10/18/16 0224  WBC 12.9*  < > 15.2* 12.0*  NEUTROABS 5.9  --   --   --   HGB 10.5*  < > 9.4* 10.1*  HCT 34.4*  < > 31.9* 33.5*  MCV 87.3  < > 86.9 86.1  PLT 363  < > 335 353  < > = values in this interval not displayed. Basic Metabolic Panel  Recent Labs  10/17/16 0219 10/18/16 0224  NA 143 145  K 3.6 3.6  CL 107 106  CO2 26 29  GLUCOSE 168* 130*  BUN 25* 39*  CREATININE 2.15* 2.17*  CALCIUM 9.8 9.8  MG 2.2 2.2  PHOS 4.4 4.2   Liver Function Tests  Recent Labs  10/15/16 1040  AST 30  ALT  9*  ALKPHOS 54  BILITOT 0.5  PROT 7.3  ALBUMIN 3.4*   No results for input(s): LIPASE, AMYLASE in the last 72 hours. Cardiac Enzymes  Recent Labs  10/15/16 1619 10/15/16 2121 10/16/16 0252  TROPONINI 0.93* 1.03* 1.00*   BNP Invalid input(s): POCBNP D-Dimer No results for input(s): DDIMER in the last 72 hours. Hemoglobin A1C  Recent Labs  10/15/16 1614  HGBA1C 6.9*   Fasting Lipid Panel No results for input(s): CHOL, HDL, LDLCALC, TRIG, CHOLHDL, LDLDIRECT in the last 72 hours. Thyroid Function Tests No results for input(s): TSH, T4TOTAL, T3FREE, THYROIDAB in the last 72 hours.  Invalid input(s): FREET3  Telemetry    Paced rhythm  ECG    NA  Radiology    Dg Chest Port 1 View  Result Date: 10/17/2016 CLINICAL DATA:  Hypoxia EXAM: PORTABLE CHEST 1 VIEW COMPARISON:  October 16, 2016 FINDINGS: Endotracheal tube tip is 1.2 cm above the carina. Pacemaker leads attached to right atrium and right ventricle. No pneumothorax. There is no edema or consolidation. Heart is mildly enlarged with pulmonary vascularity within normal limits. No adenopathy. There is aortic atherosclerosis. IMPRESSION:  Endotracheal tube tip is 1.2 cm above the carina. It may be prudent to consider withdrawing endotracheal tube approximately 2 cm. No edema or consolidation. Aortic atherosclerosis. Pacemaker present with lead tips attached to right atrium and right ventricle. Electronically Signed   By: Lowella Grip III M.D.   On: 10/17/2016 10:07   Dg Chest Port 1 View  Result Date: 10/16/2016 CLINICAL DATA:  Respiratory failure. EXAM: PORTABLE CHEST 1 VIEW COMPARISON:  10/15/2016 FINDINGS: Endotracheal tube is 3.3 cm above the carina. Nasogastric tube extends to the abdomen and appears to extend into the proximal duodenum. Stable position of the right cardiac pacemaker leads. Heart size is mildly enlarged. Densities near the left costophrenic angle could represent atelectasis or small effusion. No evidence for pulmonary edema. Negative for a pneumothorax. IMPRESSION: Increased left basilar densities. This could represent atelectasis or small effusion. Support apparatuses as described. Electronically Signed   By: Markus Daft M.D.   On: 10/16/2016 07:10   Dg Chest Portable 1 View  Result Date: 10/15/2016 CLINICAL DATA:  Intubation. Asthma. Congestive heart failure. Atrial fibrillation. EXAM: PORTABLE CHEST 1 VIEW COMPARISON:  10/15/2016 FINDINGS: Endotracheal tube tip 3.5 cm above the carina. Nasogastric tube enters the stomach. Dual lead pacer noted. Mild enlargement of the cardiopericardial silhouette. Upper zone pulmonary vascular prominence could be from supine positioning. Linear bandlike density in the left retrocardiac region potentially from atelectasis. IMPRESSION: 1. Tubes and lines satisfactorily position. 2. Mild enlargement of the cardiopericardial silhouette, without overt edema. 3. Left retrocardiac subsegmental atelectasis. Electronically Signed   By: Van Clines M.D.   On: 10/15/2016 13:03   Dg Chest Portable 1 View  Result Date: 10/15/2016 CLINICAL DATA:  Respiratory distress, shortness of  Breath EXAM: PORTABLE CHEST 1 VIEW COMPARISON:  08/17/2016 FINDINGS: Right pacer remains in place, unchanged. Cardiomegaly. No confluent opacities or effusions. No acute bony abnormality. IMPRESSION: Cardiomegaly.  No active disease. Electronically Signed   By: Rolm Baptise M.D.   On: 10/15/2016 12:13   Dg Abd Portable 1v  Result Date: 10/17/2016 CLINICAL DATA:  Evaluate feeding tube EXAM: PORTABLE ABDOMEN - 1 VIEW COMPARISON:  None. FINDINGS: The feeding tube is coiled within the stomach. IMPRESSION: The feeding tube is coiled within the stomach with the distal tip in the fundus. Electronically Signed   By: Dorise Bullion III M.D   On:  10/17/2016 16:16    Assessment & Plan    VENT DEPENDENT:   Plan for extubation in the OR on Monday with possible tracheostomy.   ELEVATED TROPONIN:  Cath in 08/2016 with normal coronaries.    No further work up.    CHRONIC DIASTOLIC DYSFUNCTION:    Negative 1.8 liters yesterday.   Creat is stable. IV Lasix given yesterday and now on PO.    HTN:  BP is somewhat labile.    ATRIAL FIB:  Rate controlled and not on anticoagulation secondary to GIB.     Signed, Minus Breeding, MD  10/18/2016, 10:38 AM

## 2016-10-18 NOTE — Progress Notes (Signed)
RT note-Patient passed leak test, however, very sleepy at this time, wean terminated.

## 2016-10-18 NOTE — Progress Notes (Signed)
PULMONARY / CRITICAL CARE MEDICINE   Name: [name] MRN: [id number] DOB: [birth year]    ADMISSION DATE:  10/15/2016  REFERRING MD:  Lita Mains - EDP.  CHIEF COMPLAINT:  Dyspnea, stridor   Brief:    70yo female with multiple medical problems including dCHF, GERD, hx ETOH, AFib, CKD, angioedema with chronic upper airway narrowing after an intubation with recent laryngoscopy by ENT with removal of granulation tissue 3 weeks prior to admit but has continued to have dyspnea and stridor.  She presented 11/1 to ER with ongoing dyspnea and stridor.  She initially improved with steroids, BD and ativan and was seen by ENT who scheduled direct laryngoscopy for 11/2 but wanted cardiology clearance first r/t troponin 0.57.  Over time she became increasingly stridorous, tripoding, diaphoretic and required intubation in ER. PCCM called to admit.   SIGNIFICANT EVENTS: 11/1 > admitted with stridor / respiratory distress requiring intubation. 11/2 > MICRODIRECTLARYNGOSCOPY WITH LASER ABLATION AND KENLOG INJECTION - failed extubaiton in OR and reintubated   SUBJECTIVE/OVERNIGHT/INTERVAL HX 10/18/16 - awaits repeat or trip 10/20/16. Not on TF.Awake  And calmo n vent    VITAL SIGNS: BP (!) 148/87   Pulse 74   Temp 98.9 F (37.2 C) (Oral)   Resp 14   Ht 5\' 2"  (1.575 m)   Wt 73.8 kg (162 lb 11.2 oz)   SpO2 100%   BMI 29.76 kg/m   HEMODYNAMICS:    VENTILATOR SETTINGS: Vent Mode: PRVC FiO2 (%):  [40 %] 40 % Set Rate:  [12 bmp] 12 bmp Vt Set:  [330 mL] 330 mL PEEP:  [5 cmH20] 5 cmH20 Pressure Support:  [8 cmH20-10 cmH20] 8 cmH20 Plateau Pressure:  [12 cmH20-15 cmH20] 12 cmH20  INTAKE / OUTPUT: I/O last 3 completed shifts: In: 1503.6 [I.V.:1503.6] Out: 2905 [Urine:2905]  PHYSICAL EXAMINATION: General: AA female, resting comfortably on vent. Neuro: On vent, awake, MAE's. HEENT: [place]/AT. PERRL, sclerae anicteric. Cardiovascular: RRR, no M/R/G.  Lungs: Respirations even and  unlabored.  Clear, diminished breath sounds bilaterally, No W/R/R. No stridor. Abdomen: BS x 4, soft, NT/ND.  Musculoskeletal: No gross deformities, no edema.  Skin: Intact, warm, no rashes.   LABS:  PULMONARY  Recent Labs Lab 10/15/16 1331 10/15/16 1541 10/16/16 0308  PHART 7.431 7.624* 7.399  PCO2ART 52.4* 29.8* 51.7*  PO2ART 35.0* 141.0* 140*  HCO3 35.1* 31.0* 31.6*  TCO2 37 32  --   O2SAT 70.0 100.0 98.6    CBC  Recent Labs Lab 10/16/16 0252 10/17/16 0219 10/18/16 0224  HGB 9.3* 9.4* 10.1*  HCT 30.8* 31.9* 33.5*  WBC 7.8 15.2* 12.0*  PLT 303 335 353    COAGULATION No results for input(s): INR in the last 168 hours.  CARDIAC   Recent Labs Lab 10/15/16 1619 10/15/16 2121 10/16/16 0252  TROPONINI 0.93* 1.03* 1.00*   No results for input(s): PROBNP in the last 168 hours.   CHEMISTRY  Recent Labs Lab 10/15/16 1040 10/15/16 1207 10/15/16 1619 10/16/16 0252  10/17/16 0219 10/18/16 0224  NA 140  --   --  140  --  143 145  K 2.4*  --   --  2.3*  < > 3.6 3.6  CL 93*  --   --  98*  --  107 106  CO2 32  --   --  30  --  26 29  GLUCOSE 180*  --   --  192*  --  168* 130*  BUN 14  --   --  19  --  25* 39*  CREATININE 2.07*  --  2.27* 2.19*  --  2.15* 2.17*  CALCIUM 9.9  --   --  9.4  --  9.8 9.8  MG  --  2.0  --  2.1  --  2.2 2.2  PHOS  --   --   --  4.4  --  4.4 4.2  < > = values in this interval not displayed. Estimated Creatinine Clearance: 22 mL/min (by C-G formula based on SCr of 2.17 mg/dL (H)).   LIVER  Recent Labs Lab 10/15/16 1040  AST 30  ALT 9*  ALKPHOS 54  BILITOT 0.5  PROT 7.3  ALBUMIN 3.4*     INFECTIOUS No results for input(s): LATICACIDVEN, PROCALCITON in the last 168 hours.   ENDOCRINE CBG (last 3)   Recent Labs  10/17/16 2328 10/18/16 0346 10/18/16 0755  GLUCAP 144* 129* 154*         IMAGING x48h  - image(s) personally visualized  -   highlighted in bold Dg Chest Port 1 View  Result Date:  10/17/2016 CLINICAL DATA:  Hypoxia EXAM: PORTABLE CHEST 1 VIEW COMPARISON:  October 16, 2016 FINDINGS: Endotracheal tube tip is 1.2 cm above the carina. Pacemaker leads attached to right atrium and right ventricle. No pneumothorax. There is no edema or consolidation. Heart is mildly enlarged with pulmonary vascularity within normal limits. No adenopathy. There is aortic atherosclerosis. IMPRESSION: Endotracheal tube tip is 1.2 cm above the carina. It may be prudent to consider withdrawing endotracheal tube approximately 2 cm. No edema or consolidation. Aortic atherosclerosis. Pacemaker present with lead tips attached to right atrium and right ventricle. Electronically Signed   By: Lowella Grip III M.D.   On: 10/17/2016 10:07   Dg Abd Portable 1v  Result Date: 10/17/2016 CLINICAL DATA:  Evaluate feeding tube EXAM: PORTABLE ABDOMEN - 1 VIEW COMPARISON:  None. FINDINGS: The feeding tube is coiled within the stomach. IMPRESSION: The feeding tube is coiled within the stomach with the distal tip in the fundus. Electronically Signed   By: Dorise Bullion III M.D   On: 10/17/2016 16:16     ASSESSMENT / PLAN:  PULMONARY A: Acute hypoxic respiratory failure - due to stridor from laryngeal stenosis following intubation s/p laryngoscopy with laser ablation  Hx OSA, Former smoker.  - no extubationn till OR controlled extubaiton 10/20/16 with possible trach conversion if fails  P:   Full vent support. Wean as able. Chest Xray today  VAP prevention measures. ENT following. May eventually require trach. Albuterol PRN. Decadron scheduled.  CARDIOVASCULAR A:  Troponin leak - consider demand - down-trending  dCHF - RHC and LHC 08/19/16 with normal coronary arteries, severe diastolic dysfunction or significant MR, normal LV. Hx HCM, HTN, HLD   - nol aacute P:  Per cards   RENAL A:   Hypokalemia - s/p repletion  AoCKD (Admission Cret 2.07/ BUN 14)   - K < 4 P:   Replace potassium   Electrolyte replacement as needed  D/C Fluids  Trend BMP   GASTROINTESTINAL A:   GI prophylaxis. Nutrition.s/p cortrak 10/17/16 P:   SUP: Famotidine. NPO. Start TF  HEMATOLOGIC A:   VTE Prophylaxis. P:  SCD's  SQ Heparin  CBC in AM.  INFECTIOUS A:   Leukocytosis (current steroid regimen)  P:   Monitor clinically. Trend WBC and Fever Curve   ENDOCRINE A:   Hyperglycemia - no hx DM. P:   SSI. q4H glucose checks   NEUROLOGIC A:   Acute  encephalopathy. Hx RLS, panic attacks, ETOH abuse (quit 2012), DDD.   - normal WUA P:   Precedex gtt wean as tolerated  Fentanyl prn  RASS goal: 0 to -1. Daily WUA. Hold preadmission alprazolam.  Family updated: Daughter at bedside update on plan for today.   Interdisciplinary Family Meeting v Palliative Care Meeting:  Due by: 10/21/16.   The patient is critically ill with multiple organ systems failure and requires high complexity decision making for assessment and support, frequent evaluation and titration of therapies, application of advanced monitoring technologies and extensive interpretation of multiple databases.   Critical Care Time devoted to patient care services described in this note is  30  Minutes. This time reflects time of care of this signee Dr Brand Males. This critical care time does not reflect procedure time, or teaching time or supervisory time of PA/NP/Med student/Med Resident etc but could involve care discussion time    Dr. Brand Males, M.D., [facility].C.P Pulmonary and Critical Care Medicine Staff Physician [facility] Pager: [phone], If no answer or between  15:00h - 7:00h: call [phone]  10/18/2016 11:24 AM

## 2016-10-18 NOTE — Progress Notes (Signed)
Nutrition Follow-up  DOCUMENTATION CODES:  Not applicable  INTERVENTION:  Initiate TF via NGT with Vital AF 1.2 at goal rate of 40 ml/h (960 ml per day) and Prostat 30 ml BID to provide 1352 kcals, 102 gm protein, 779 ml free water daily.  PEP Up protocol  NUTRITION DIAGNOSIS:  Inadequate oral intake related to inability to eat as evidenced by NPO status.  GOAL:  Patient will meet greater than or equal to 90% of their needs  MONITOR:  Vent status, TF tolerance, Labs, I & O's, Skin, Weight trends  REASON FOR ASSESSMENT:  Consult Enteral/tube feeding initiation and management  ASSESSMENT:  70yo female with multiple medical problems including dCHF, GERD, hx ETOH, AFib, CKD, angioedema with chronic upper airway narrowing after an intubation with recent laryngoscopy by ENT with removal of granulation tissue 3 weeks prior to admit but has continued to have dyspnea and stridor.  She presented 11/1 to ER with ongoing dyspnea and stridor.  She initially improved with steroids, BD and ativan and was seen by ENT who scheduled direct laryngoscopy for 11/2 but wanted cardiology clearance first r/t troponin 0.57.  Over time she became increasingly stridorous, tripoding, diaphoretic and required intubation in ER.   Cortrak tube placed yesterday. Tube is coiled in stomach.   Family member at bedside reports pt's last BM was a small one yesterday.   Patient is currently intubated on ventilator support MV: 6.2 L/min Temp (24hrs), Avg:98.2 F (36.8 C), Min:97.6 F (36.4 C), Max:98.9 F (37.2 C)  Labs: Slight hyperglycemia, WBC:12.0 Medications: Fentanyl, precedex, dexamethsone, Iron, Lasix, PPI, Miralax,    Recent Labs Lab 10/16/16 0252 10/16/16 1114 10/17/16 0219 10/18/16 0224  NA 140  --  143 145  K 2.3* 4.1 3.6 3.6  CL 98*  --  107 106  CO2 30  --  26 29  BUN 19  --  25* 39*  CREATININE 2.19*  --  2.15* 2.17*  CALCIUM 9.4  --  9.8 9.8  MG 2.1  --  2.2 2.2  PHOS 4.4  --  4.4 4.2   GLUCOSE 192*  --  168* 130*  ; Diet Order:     Skin: DRY  Last BM:  Yesterday-small (per family member)  Height:  Ht Readings from Last 1 Encounters:  10/15/16 5\' 2"  (1.575 m)   Weight:  Wt Readings from Last 1 Encounters:  10/18/16 162 lb 11.2 oz (73.8 kg)   Wt Readings from Last 10 Encounters:  10/18/16 162 lb 11.2 oz (73.8 kg)  09/29/16 161 lb 6.4 oz (73.2 kg)  09/24/16 161 lb (73 kg)  09/18/16 167 lb (75.8 kg)  09/10/16 167 lb 9.6 oz (76 kg)  09/09/16 167 lb 12.8 oz (76.1 kg)  09/04/16 164 lb 6.4 oz (74.6 kg)  08/26/16 170 lb (77.1 kg)  08/25/16 170 lb 6.4 oz (77.3 kg)  08/21/16 165 lb 11.2 oz (75.2 kg)  Dry weight: 151 lbs 4 oz (68.75 kg)  Ideal Body Weight:  50 kg  BMI:  Body mass index using dry weight is 27.7 kg/m.  Estimated Nutritional Needs:  Kcal:  1300 kcals Protein:  85-100 g (1.7-2g/kg ibw) Fluid:  2 liters  EDUCATION NEEDS:  No education needs identified at this time  Burtis Junes RD, LDN, [facility] Pager: [phone] 10/18/2016 1:22 PM

## 2016-10-19 LAB — CBC
HEMATOCRIT: 33.3 % — AB (ref 36.0–46.0)
HEMOGLOBIN: 10 g/dL — AB (ref 12.0–15.0)
MCH: 26.3 pg (ref 26.0–34.0)
MCHC: 30 g/dL (ref 30.0–36.0)
MCV: 87.6 fL (ref 78.0–100.0)
Platelets: 278 10*3/uL (ref 150–400)
RBC: 3.8 MIL/uL — AB (ref 3.87–5.11)
RDW: 16.1 % — ABNORMAL HIGH (ref 11.5–15.5)
WBC: 8 10*3/uL (ref 4.0–10.5)

## 2016-10-19 LAB — GLUCOSE, CAPILLARY
GLUCOSE-CAPILLARY: 155 mg/dL — AB (ref 65–99)
GLUCOSE-CAPILLARY: 174 mg/dL — AB (ref 65–99)
GLUCOSE-CAPILLARY: 192 mg/dL — AB (ref 65–99)
GLUCOSE-CAPILLARY: 203 mg/dL — AB (ref 65–99)
GLUCOSE-CAPILLARY: 230 mg/dL — AB (ref 65–99)
Glucose-Capillary: 171 mg/dL — ABNORMAL HIGH (ref 65–99)
Glucose-Capillary: 185 mg/dL — ABNORMAL HIGH (ref 65–99)

## 2016-10-19 LAB — BASIC METABOLIC PANEL
Anion gap: 9 (ref 5–15)
BUN: 49 mg/dL — ABNORMAL HIGH (ref 6–20)
CHLORIDE: 108 mmol/L (ref 101–111)
CO2: 29 mmol/L (ref 22–32)
CREATININE: 1.59 mg/dL — AB (ref 0.44–1.00)
Calcium: 9.6 mg/dL (ref 8.9–10.3)
GFR calc non Af Amer: 31 mL/min — ABNORMAL LOW (ref >60)
GFR, EST AFRICAN AMERICAN: 36 mL/min — AB (ref >60)
Glucose, Bld: 155 mg/dL — ABNORMAL HIGH (ref 65–99)
POTASSIUM: 4.2 mmol/L (ref 3.5–5.1)
SODIUM: 146 mmol/L — AB (ref 135–145)

## 2016-10-19 LAB — PHOSPHORUS: Phosphorus: 3.3 mg/dL (ref 2.5–4.6)

## 2016-10-19 LAB — MAGNESIUM: Magnesium: 2.3 mg/dL (ref 1.7–2.4)

## 2016-10-19 NOTE — Progress Notes (Signed)
PULMONARY / CRITICAL CARE MEDICINE   Name: [name] MRN: [id number] DOB: June 06, 1946    ADMISSION DATE:  10/15/2016  REFERRING MD:  Lita Mains - EDP.  CHIEF COMPLAINT:  Dyspnea, stridor   Brief:    70yo female with multiple medical problems including dCHF, GERD, hx ETOH, AFib, CKD, angioedema with chronic upper airway narrowing after an intubation with recent laryngoscopy by ENT with removal of granulation tissue 3 weeks prior to admit but has continued to have dyspnea and stridor.  She presented 11/1 to ER with ongoing dyspnea and stridor.  She initially improved with steroids, BD and ativan and was seen by ENT who scheduled direct laryngoscopy for 11/2 but wanted cardiology clearance first r/t troponin 0.57.  Over time she became increasingly stridorous, tripoding, diaphoretic and required intubation in ER. PCCM called to admit.   SIGNIFICANT EVENTS: 11/1 > admitted with stridor / respiratory distress requiring intubation. 11/2 > MICRODIRECTLARYNGOSCOPY WITH LASER ABLATION AND KENLOG INJECTION - failed extubaiton in OR and reintubated 10/18/16 - awaits repeat or trip 10/20/16. Not on TF.Awake  And calmo n vent    SUBJECTIVE/OVERNIGHT/INTERVAL HX 10/19/16 - On TF. No overnight issues. To OR 10/20/16 . AKi better   VITAL SIGNS: BP 128/84   Pulse 74   Temp 97.5 F (36.4 C) (Oral)   Resp 13   Ht 5\' 2"  (1.575 m)   Wt 73.7 kg (162 lb 7.7 oz)   SpO2 100%   BMI 29.72 kg/m   HEMODYNAMICS:    VENTILATOR SETTINGS: Vent Mode: PRVC FiO2 (%):  [40 %] 40 % Set Rate:  [12 bmp] 12 bmp Vt Set:  [330 mL] 330 mL PEEP:  [5 cmH20] 5 cmH20 Pressure Support:  [8 cmH20] 8 cmH20 Plateau Pressure:  [10 cmH20-13 cmH20] 11 cmH20  INTAKE / OUTPUT: I/O last 3 completed shifts: In: 1548 [I.V.:868; NG/GT:680] Out: 2995 [Urine:2995]  PHYSICAL EXAMINATION: General: AA female, resting comfortably on vent. Neuro: On vent, awake, MAE's. HEENT: [place]/AT. PERRL, sclerae  anicteric. Cardiovascular: RRR, no M/R/G.  Lungs: Respirations even and unlabored.  Clear, diminished breath sounds bilaterally, No W/R/R. No stridor. Abdomen: BS x 4, soft, NT/ND.  Musculoskeletal: No gross deformities, no edema.  Skin: Intact, warm, no rashes.   LABS:  PULMONARY  Recent Labs Lab 10/15/16 1331 10/15/16 1541 10/16/16 0308  PHART 7.431 7.624* 7.399  PCO2ART 52.4* 29.8* 51.7*  PO2ART 35.0* 141.0* 140*  HCO3 35.1* 31.0* 31.6*  TCO2 37 32  --   O2SAT 70.0 100.0 98.6    CBC  Recent Labs Lab 10/17/16 0219 10/18/16 0224 10/19/16 0315  HGB 9.4* 10.1* 10.0*  HCT 31.9* 33.5* 33.3*  WBC 15.2* 12.0* 8.0  PLT 335 353 278    COAGULATION No results for input(s): INR in the last 168 hours.  CARDIAC    Recent Labs Lab 10/15/16 1619 10/15/16 2121 10/16/16 0252  TROPONINI 0.93* 1.03* 1.00*   No results for input(s): PROBNP in the last 168 hours.   CHEMISTRY  Recent Labs Lab 10/15/16 1040 10/15/16 1207 10/15/16 1619 10/16/16 0252  10/17/16 0219 10/18/16 0224 10/19/16 0315  NA 140  --   --  140  --  143 145 146*  K 2.4*  --   --  2.3*  < > 3.6 3.6 4.2  CL 93*  --   --  98*  --  107 106 108  CO2 32  --   --  30  --  26 29 29   GLUCOSE 180*  --   --  192*  --  168* 130* 155*  BUN 14  --   --  19  --  25* 39* 49*  CREATININE 2.07*  --  2.27* 2.19*  --  2.15* 2.17* 1.59*  CALCIUM 9.9  --   --  9.4  --  9.8 9.8 9.6  MG  --  2.0  --  2.1  --  2.2 2.2 2.3  PHOS  --   --   --  4.4  --  4.4 4.2 3.3  < > = values in this interval not displayed. Estimated Creatinine Clearance: 30 mL/min (by C-G formula based on SCr of 1.59 mg/dL (H)).   LIVER  Recent Labs Lab 10/15/16 1040  AST 30  ALT 9*  ALKPHOS 54  BILITOT 0.5  PROT 7.3  ALBUMIN 3.4*     INFECTIOUS No results for input(s): LATICACIDVEN, PROCALCITON in the last 168 hours.   ENDOCRINE CBG (last 3)   Recent Labs  10/18/16 2336 10/19/16 0402 10/19/16 0744  GLUCAP 203* 155* 174*          IMAGING x48h  - image(s) personally visualized  -   highlighted in bold Dg Chest Port 1 View  Result Date: 10/17/2016 CLINICAL DATA:  Hypoxia EXAM: PORTABLE CHEST 1 VIEW COMPARISON:  October 16, 2016 FINDINGS: Endotracheal tube tip is 1.2 cm above the carina. Pacemaker leads attached to right atrium and right ventricle. No pneumothorax. There is no edema or consolidation. Heart is mildly enlarged with pulmonary vascularity within normal limits. No adenopathy. There is aortic atherosclerosis. IMPRESSION: Endotracheal tube tip is 1.2 cm above the carina. It may be prudent to consider withdrawing endotracheal tube approximately 2 cm. No edema or consolidation. Aortic atherosclerosis. Pacemaker present with lead tips attached to right atrium and right ventricle. Electronically Signed   By: Lowella Grip III M.D.   On: 10/17/2016 10:07   Dg Abd Portable 1v  Result Date: 10/17/2016 CLINICAL DATA:  Evaluate feeding tube EXAM: PORTABLE ABDOMEN - 1 VIEW COMPARISON:  None. FINDINGS: The feeding tube is coiled within the stomach. IMPRESSION: The feeding tube is coiled within the stomach with the distal tip in the fundus. Electronically Signed   By: Dorise Bullion III M.D   On: 10/17/2016 16:16     ASSESSMENT / PLAN:  PULMONARY A: Acute hypoxic respiratory failure - due to stridor from laryngeal stenosis following intubation s/p laryngoscopy with laser ablation  Hx OSA, Former smoker.  - no extubationn till OR controlled extubaiton 10/20/16 with possible trach conversion if fails  P:   Full vent support. Wean as able. Chest Xray today  VAP prevention measures. ENT following. May eventually require trach. Albuterol PRN. Decadron scheduled.  CARDIOVASCULAR A:  Troponin leak - consider demand - down-trending  dCHF - RHC and LHC 08/19/16 with normal coronary arteries, severe diastolic dysfunction or significant MR, normal LV. Hx HCM, HTN, HLD   - nil aacute P:  Per  cards   RENAL A:   Hypokalemia - s/p repletion  AoCKD (Admission Cret 2.07/ BUN 14)   - AKI improved 10/19/16  P:   Electrolyte replacement as needed  D/C Fluids  Trend BMP   GASTROINTESTINAL A:   GI prophylaxis. Nutrition.s/p cortrak 10/17/16 P:   SUP: Famotidine. NPO.  TF since 10/18/16 - will have to be held for surgery 10/20/16  HEMATOLOGIC A:   VTE Prophylaxis. P:  SCD's  SQ Heparin  CBC in AM.  INFECTIOUS A:   Leukocytosis (current steroid regimen)  P:  Monitor clinically. Trend WBC and Fever Curve   ENDOCRINE A:   Hyperglycemia - no hx DM. P:   SSI. q4H glucose checks   NEUROLOGIC A:   Acute encephalopathy. Hx RLS, panic attacks, ETOH abuse (quit 2012), DDD.   - normal WUA P:   Precedex gtt wean as tolerated  Fentanyl prn  RASS goal: 0 to -1. Daily WUA. Hold preadmission alprazolam.  Family updated: Daughter at bedside update on plan for 10/18/16. No family at bedside 10/19/16   Interdisciplinary Family Meeting v Palliative Care Meeting:  Due by: 10/21/16.   The patient is critically ill with multiple organ systems failure and requires high complexity decision making for assessment and support, frequent evaluation and titration of therapies, application of advanced monitoring technologies and extensive interpretation of multiple databases.   Critical Care Time devoted to patient care services described in this note is  30  Minutes. This time reflects time of care of this signee Dr Brand Males. This critical care time does not reflect procedure time, or teaching time or supervisory time of PA/NP/Med student/Med Resident etc but could involve care discussion time    Dr. Brand Males, M.D., [facility].C.P Pulmonary and Critical Care Medicine Staff Physician [facility] Pager: [phone], If no answer or between  15:00h - 7:00h: call [phone]  10/19/2016 8:17 AM

## 2016-10-20 ENCOUNTER — Inpatient Hospital Stay (HOSPITAL_COMMUNITY): Payer: Medicare Other | Admitting: Anesthesiology

## 2016-10-20 ENCOUNTER — Encounter (HOSPITAL_COMMUNITY): Payer: Self-pay | Admitting: Anesthesiology

## 2016-10-20 ENCOUNTER — Encounter (HOSPITAL_COMMUNITY)
Admission: EM | Disposition: A | Discharge status: Home Health | Payer: Self-pay | Source: Home / Self Care | Attending: Family Medicine

## 2016-10-20 DIAGNOSIS — J9690 Respiratory failure, unspecified, unspecified whether with hypoxia or hypercapnia: Secondary | ICD-10-CM

## 2016-10-20 HISTORY — PX: DIRECT LARYNGOSCOPY: SHX5326

## 2016-10-20 LAB — BASIC METABOLIC PANEL
Anion gap: 11 (ref 5–15)
BUN: 48 mg/dL — AB (ref 6–20)
CHLORIDE: 104 mmol/L (ref 101–111)
CO2: 31 mmol/L (ref 22–32)
CREATININE: 1.37 mg/dL — AB (ref 0.44–1.00)
Calcium: 10 mg/dL (ref 8.9–10.3)
GFR calc Af Amer: 43 mL/min — ABNORMAL LOW (ref >60)
GFR calc non Af Amer: 38 mL/min — ABNORMAL LOW (ref >60)
Glucose, Bld: 171 mg/dL — ABNORMAL HIGH (ref 65–99)
POTASSIUM: 3.8 mmol/L (ref 3.5–5.1)
SODIUM: 146 mmol/L — AB (ref 135–145)

## 2016-10-20 LAB — PHOSPHORUS: Phosphorus: 1.8 mg/dL — ABNORMAL LOW (ref 2.5–4.6)

## 2016-10-20 LAB — GLUCOSE, CAPILLARY
GLUCOSE-CAPILLARY: 169 mg/dL — AB (ref 65–99)
GLUCOSE-CAPILLARY: 160 mg/dL — AB (ref 65–99)
GLUCOSE-CAPILLARY: 143 mg/dL — AB (ref 65–99)
Glucose-Capillary: 166 mg/dL — ABNORMAL HIGH (ref 65–99)
Glucose-Capillary: 164 mg/dL — ABNORMAL HIGH (ref 65–99)
Glucose-Capillary: 188 mg/dL — ABNORMAL HIGH (ref 65–99)

## 2016-10-20 LAB — CBC
HEMATOCRIT: 33.4 % — AB (ref 36.0–46.0)
Hemoglobin: 9.9 g/dL — ABNORMAL LOW (ref 12.0–15.0)
MCH: 25.6 pg — AB (ref 26.0–34.0)
MCHC: 29.6 g/dL — ABNORMAL LOW (ref 30.0–36.0)
MCV: 86.3 fL (ref 78.0–100.0)
Platelets: 357 10*3/uL (ref 150–400)
RBC: 3.87 MIL/uL (ref 3.87–5.11)
RDW: 15.9 % — AB (ref 11.5–15.5)
WBC: 9.1 10*3/uL (ref 4.0–10.5)

## 2016-10-20 LAB — MAGNESIUM: Magnesium: 2.2 mg/dL (ref 1.7–2.4)

## 2016-10-20 SURGERY — LARYNGOSCOPY, DIRECT
Anesthesia: General | Site: Throat

## 2016-10-20 MED ORDER — OXYMETAZOLINE HCL 0.05 % NA SOLN
NASAL | Status: AC
  Filled 2016-10-20: qty 15

## 2016-10-20 MED ORDER — FUROSEMIDE 10 MG/ML IJ SOLN
40.0000 mg | Freq: Once | INTRAMUSCULAR | Status: AC
Start: 2016-10-20 — End: 2016-10-20
  Administered 2016-10-20: 40 mg via INTRAVENOUS
  Filled 2016-10-20: qty 4

## 2016-10-20 MED ORDER — MIDAZOLAM HCL 2 MG/2ML IJ SOLN
INTRAMUSCULAR | Status: AC
  Filled 2016-10-20: qty 2

## 2016-10-20 MED ORDER — 0.9 % SODIUM CHLORIDE (POUR BTL) OPTIME
TOPICAL | Status: DC | PRN
  Administered 2016-10-20: 1000 mL

## 2016-10-20 MED ORDER — LIDOCAINE-EPINEPHRINE 1 %-1:100000 IJ SOLN
INTRAMUSCULAR | Status: AC
Start: 2016-10-20 — End: 2016-10-20
  Filled 2016-10-20: qty 1

## 2016-10-20 MED ORDER — CHLORHEXIDINE GLUCONATE 4 % EX LIQD
60.0000 mL | Freq: Once | CUTANEOUS | Status: AC
  Administered 2016-10-20: 4 via TOPICAL
  Filled 2016-10-20: qty 60

## 2016-10-20 MED ORDER — TRIAMCINOLONE ACETONIDE 40 MG/ML IJ SUSP
INTRAMUSCULAR | Status: AC
Start: 2016-10-20 — End: 2016-10-20
  Filled 2016-10-20: qty 5

## 2016-10-20 MED ORDER — RACEPINEPHRINE HCL 2.25 % IN NEBU
0.2500 mL | INHALATION_SOLUTION | Freq: Once | RESPIRATORY_TRACT | Status: AC
  Administered 2016-10-20: 0.5 mL via RESPIRATORY_TRACT
  Filled 2016-10-20: qty 0.5

## 2016-10-20 MED ORDER — EPINEPHRINE HCL (NASAL) 0.1 % NA SOLN
NASAL | Status: AC
  Filled 2016-10-20: qty 30

## 2016-10-20 MED ORDER — KETAMINE HCL-SODIUM CHLORIDE 100-0.9 MG/10ML-% IV SOSY
PREFILLED_SYRINGE | INTRAVENOUS | Status: AC
  Filled 2016-10-20: qty 20

## 2016-10-20 MED ORDER — DEXAMETHASONE SODIUM PHOSPHATE 10 MG/ML IJ SOLN
10.0000 mg | Freq: Once | INTRAMUSCULAR | Status: AC
  Administered 2016-10-20: 10 mg via INTRAVENOUS
  Filled 2016-10-20: qty 1

## 2016-10-20 MED ORDER — CHLORHEXIDINE GLUCONATE 4 % EX LIQD: 60.0000 mL | Freq: Once | CUTANEOUS | Status: DC

## 2016-10-20 MED ORDER — WHITE PETROLATUM GEL
Status: AC
  Administered 2016-10-20: 15:00:00
  Filled 2016-10-20: qty 1

## 2016-10-20 MED ORDER — LABETALOL HCL 5 MG/ML IV SOLN: 5.0000 mg | INTRAVENOUS | Status: DC | PRN

## 2016-10-20 MED ORDER — FENTANYL CITRATE (PF) 100 MCG/2ML IJ SOLN
INTRAMUSCULAR | Status: AC
  Filled 2016-10-20: qty 2

## 2016-10-20 SURGICAL SUPPLY — 47 items
BALLN PULM 15 16.5 18 X 75CM (BALLOONS)
BALLN PULM 15 16.5 18X75 (BALLOONS)
BALLOON PULM 15 16.5 18X75 (BALLOONS) IMPLANT
BLADE SURG 15 STRL LF DISP TIS (BLADE) IMPLANT
BLADE SURG 15 STRL SS (BLADE)
BLADE SURG ROTATE 9660 (MISCELLANEOUS) IMPLANT
CANISTER SUCTION 2500CC (MISCELLANEOUS) ×4 IMPLANT
CLEANER TIP ELECTROSURG 2X2 (MISCELLANEOUS) ×4 IMPLANT
CONT SPEC 4OZ CLIKSEAL STRL BL (MISCELLANEOUS) IMPLANT
COVER MAYO STAND STRL (DRAPES) ×4 IMPLANT
COVER SURGICAL LIGHT HANDLE (MISCELLANEOUS) ×4 IMPLANT
COVER TABLE BACK 60X90 (DRAPES) IMPLANT
CRADLE DONUT ADULT HEAD (MISCELLANEOUS) ×4 IMPLANT
DECANTER SPIKE VIAL GLASS SM (MISCELLANEOUS) IMPLANT
DRAPE PROXIMA HALF (DRAPES) IMPLANT
ELECT COATED BLADE 2.86 ST (ELECTRODE) ×4 IMPLANT
ELECT REM PT RETURN 9FT ADLT (ELECTROSURGICAL) ×4
ELECTRODE REM PT RTRN 9FT ADLT (ELECTROSURGICAL) ×2 IMPLANT
GLOVE BIO SURGEON STRL SZ8 (GLOVE) ×4 IMPLANT
GLOVE BIOGEL PI IND STRL 8.5 (GLOVE) ×2 IMPLANT
GLOVE BIOGEL PI INDICATOR 8.5 (GLOVE) ×2
GLOVE ECLIPSE 8.0 STRL XLNG CF (GLOVE) ×8 IMPLANT
GOWN BRE IMP SLV AUR LG STRL (GOWN DISPOSABLE) IMPLANT
GOWN STRL REUS W/ TWL LRG LVL3 (GOWN DISPOSABLE) ×2 IMPLANT
GOWN STRL REUS W/ TWL XL LVL3 (GOWN DISPOSABLE) ×2 IMPLANT
GOWN STRL REUS W/TWL LRG LVL3 (GOWN DISPOSABLE) ×2
GOWN STRL REUS W/TWL XL LVL3 (GOWN DISPOSABLE) ×2
GUARD TEETH (MISCELLANEOUS) ×4 IMPLANT
KIT BASIN OR (CUSTOM PROCEDURE TRAY) ×4 IMPLANT
KIT ROOM TURNOVER OR (KITS) ×4 IMPLANT
NEEDLE HYPO 25GX1X1/2 BEV (NEEDLE) ×4 IMPLANT
NS IRRIG 1000ML POUR BTL (IV SOLUTION) ×4 IMPLANT
PAD ARMBOARD 7.5X6 YLW CONV (MISCELLANEOUS) ×8 IMPLANT
PATTIES SURGICAL .5 X3 (DISPOSABLE) IMPLANT
PENCIL BUTTON HOLSTER BLD 10FT (ELECTRODE) ×4 IMPLANT
SOLUTION ANTI FOG 6CC (MISCELLANEOUS) ×4 IMPLANT
SPONGE DRAIN TRACH 4X4 STRL 2S (GAUZE/BANDAGES/DRESSINGS) IMPLANT
SURGILUBE 2OZ TUBE FLIPTOP (MISCELLANEOUS) IMPLANT
SUT CHROMIC 2 0 SH (SUTURE) ×4 IMPLANT
SUT ETHILON 2 0 FS 18 (SUTURE) IMPLANT
SUT SILK 2 0 SH CR/8 (SUTURE) ×4 IMPLANT
SYR INFLATE BILIARY GAUGE (MISCELLANEOUS) IMPLANT
TOWEL OR 17X24 6PK STRL BLUE (TOWEL DISPOSABLE) ×4 IMPLANT
TRAY ENT MC OR (CUSTOM PROCEDURE TRAY) ×4 IMPLANT
TUBE CONNECTING 12'X1/4 (SUCTIONS) ×1
TUBE CONNECTING 12X1/4 (SUCTIONS) ×3 IMPLANT
WATER STERILE IRR 1000ML POUR (IV SOLUTION) ×4 IMPLANT

## 2016-10-20 NOTE — Progress Notes (Addendum)
PULMONARY / CRITICAL CARE MEDICINE   Name: [name] MRN: [id number] DOB: October 16, 1946    ADMISSION DATE:  10/15/2016  REFERRING MD:  Lita Mains - EDP.  CHIEF COMPLAINT:  Dyspnea, stridor   Brief:    70yo female with multiple medical problems including dCHF, GERD, hx ETOH, AFib, CKD, angioedema with chronic upper airway narrowing after an intubation with recent laryngoscopy by ENT with removal of granulation tissue 3 weeks prior to admit but has continued to have dyspnea and stridor.  She presented 11/1 to ER with ongoing dyspnea and stridor.  She initially improved with steroids, BD and ativan and was seen by ENT who scheduled direct laryngoscopy for 11/2 but wanted cardiology clearance first r/t troponin 0.57.  Over time she became increasingly stridorous, tripoding, diaphoretic and required intubation in ER. PCCM called to admit.   Patient was hospitalized in early September for CHF requiring intubation. October presented to hospital with stridor and dyspnea, worked up by ENT for intubation related granuloma posterior subglottic larynx. On that admission underwent direct laryngoscopy and removal of granuloma.   SIGNIFICANT EVENTS: 11/1 > admitted with stridor / respiratory distress requiring intubation. 11/2 > MICRODIRECTLARYNGOSCOPY WITH LASER ABLATION AND KENLOG INJECTION - failed extubaiton in OR and reintubated   SUBJECTIVE/OVERNIGHT/INTERVAL HX  No events overnight. Sedated, resting comforting on vent. Follows commands.   VITAL SIGNS: BP (!) 169/96   Pulse 74   Temp 98.8 F (37.1 C) (Oral)   Resp 14   Ht 5\' 2"  (1.575 m)   Wt 154 lb 15.7 oz (70.3 kg)   SpO2 100%   BMI 28.35 kg/m   HEMODYNAMICS:    VENTILATOR SETTINGS: Vent Mode: PRVC FiO2 (%):  [40 %] 40 % Set Rate:  [12 bmp] 12 bmp Vt Set:  [330 mL] 330 mL PEEP:  [5 cmH20] 5 cmH20 Plateau Pressure:  [13 cmH20-16 cmH20] 16 cmH20  INTAKE / OUTPUT: I/O last 3 completed shifts: In: 2195.5 [I.V.:955.5;  NG/GT:1240] Out: 1640 [HALPF:7902]  PHYSICAL EXAMINATION: General: AA female, on vent, resting comfortably Neuro: On vent, awake, MAE's. HEENT: [place]/AT. PERRL, sclerae anicteric. Cardiovascular: RRR, no M/R/G.  Lungs: Respirations even and unlabored.  Musculoskeletal: No gross deformities, no Clear, diminished breath sounds bilaterally, No W/R/R. No stridor. Abdomen: BS x 4, soft, NT/ND. No edema.  Skin: Intact, warm, no rashes.   LABS:  PULMONARY  Recent Labs Lab 10/15/16 1331 10/15/16 1541 10/16/16 0308  PHART 7.431 7.624* 7.399  PCO2ART 52.4* 29.8* 51.7*  PO2ART 35.0* 141.0* 140*  HCO3 35.1* 31.0* 31.6*  TCO2 37 32  --   O2SAT 70.0 100.0 98.6    CBC  Recent Labs Lab 10/18/16 0224 10/19/16 0315 10/20/16 0256  HGB 10.1* 10.0* 9.9*  HCT 33.5* 33.3* 33.4*  WBC 12.0* 8.0 9.1  PLT 353 278 357    COAGULATION No results for input(s): INR in the last 168 hours.  CARDIAC    Recent Labs Lab 10/15/16 1619 10/15/16 2121 10/16/16 0252  TROPONINI 0.93* 1.03* 1.00*   No results for input(s): PROBNP in the last 168 hours.   CHEMISTRY  Recent Labs Lab 10/16/16 0252  10/17/16 0219 10/18/16 0224 10/19/16 0315 10/20/16 0256  NA 140  --  143 145 146* 146*  K 2.3*  < > 3.6 3.6 4.2 3.8  CL 98*  --  107 106 108 104  CO2 30  --  26 29 29 31   GLUCOSE 192*  --  168* 130* 155* 171*  BUN 19  --  25* 39*  49* 48*  CREATININE 2.19*  --  2.15* 2.17* 1.59* 1.37*  CALCIUM 9.4  --  9.8 9.8 9.6 10.0  MG 2.1  --  2.2 2.2 2.3 2.2  PHOS 4.4  --  4.4 4.2 3.3 1.8*  < > = values in this interval not displayed. Estimated Creatinine Clearance: 34.1 mL/min (by C-G formula based on SCr of 1.37 mg/dL (H)).   LIVER  Recent Labs Lab 10/15/16 1040  AST 30  ALT 9*  ALKPHOS 54  BILITOT 0.5  PROT 7.3  ALBUMIN 3.4*     INFECTIOUS No results for input(s): LATICACIDVEN, PROCALCITON in the last 168 hours.   ENDOCRINE CBG (last 3)   Recent Labs  10/19/16 2005  10/19/16 2337 10/20/16 0411  GLUCAP 185* 192* 169*    IMAGING x48h  - image(s) personally visualized  -   highlighted in bold No results found.   ASSESSMENT / PLAN:  PULMONARY A: Acute hypoxic respiratory failure - due to stridor from laryngeal stenosis following intubation s/p laryngoscopy with laser ablation  Hx OSA, Former smoker.  - no extubationn till OR controlled extubaiton 10/20/16 with possible trach conversion if fails  P:   Full vent support. Wean as tolerated .  VAP prevention measures. ENT following - extubation in OR today with possible tracheostomy  Albuterol PRN. Decadron scheduled.  CARDIOVASCULAR A:  Troponin leak - consider demand - down-trending  dCHF - RHC and LHC 08/19/16 with normal coronary arteries, severe diastolic dysfunction or significant MR, normal LV. Hx HCM, HTN, HLD, Afib (not on anticoagulation due to H/O GIB)   P:  Per cards Continue home Lasix and Crestor  Hydralazine 5mg  q6h PRN Labetalol for Systolic >702   RENAL A:   Hypokalemia - s/p repletion  AoCKD (Admission Cret 2.07/ BUN 14)   - AKI improved 10/19/16  P:   Electrolyte replacement as needed   Trend BMP   GASTROINTESTINAL A:   GI prophylaxis. Nutrition.s/p cortrak 10/17/16 P:   SUP: Protonix  NPO. TF since 10/18/16 - on hold for planned extubation 10/20/16  HEMATOLOGIC A:   VTE Prophylaxis. P:  SCD's  SQ Heparin  CBC in AM.  INFECTIOUS A:   Leukocytosis (current steroid regimen)/ improving   P:   Monitor clinically. Trend WBC and Fever Curve   ENDOCRINE A:   Hyperglycemia - no hx DM. P:   SSI. q4H glucose checks   NEUROLOGIC A:   Acute encephalopathy. Hx RLS, panic attacks, ETOH abuse (quit 2012), DDD.   - normal WUA P:   Precedex gtt and fenanyl wean as tolerated  Fentanyl and versed prn  RASS goal: 0 to -1. Daily WUA. Hold preadmission alprazolam.  Family updated: Daughter at bedside update on plan for 10/18/16. No family at bedside  10/19/16   Interdisciplinary Family Meeting v Palliative Care Meeting:  Due by: 10/21/16.  [name], AG-ACNP [facility]  Pgr: [phone]  PCCM Pgr: [phone]

## 2016-10-20 NOTE — Interval H&P Note (Signed)
History and Physical Interval Note:  10/20/2016 12:54 PM   No overall change in status over weekend.  Did pass leak test on 4 NOV.  Informed consent obtained from family on 2 NOV.   [name]  has presented today for surgery, with the diagnosis of LARYNGEAL STENOSIS  The various methods of treatment have been discussed with the patient and family. After consideration of risks, benefits and other options for treatment, the patient has consented to  Procedure(s): EXTUBATION, DIRECT LARYNGOSCOPY (N/A) TRACHEOSTOMY (N/A) as a surgical intervention .  The patient's history has been re-reviewed, patient re-examined, no change in status, stable for surgery.  I have re-reviewed the patient's chart and labs.  Questions were answered to the family's satisfaction.     [name]

## 2016-10-20 NOTE — Progress Notes (Signed)
Patient ID: [name], female   DOB: [birth year], 70 y.o.   MRN: [id number] Called for stridor post extubation I d/w ENT also findings and treatments of recent  No distress, rr 12, does phonate okay Mild stridor in neck, no distress, speaking okay rac epi x 1 half dose, decadron x 1 Follow closely  Lavon Paganini. Titus Mould, MD, [place] Pgr: [facility]

## 2016-10-20 NOTE — Anesthesia Postprocedure Evaluation (Signed)
Anesthesia Post Note  Patient: [name]  Procedure(s) Performed: Procedure(s) (LRB): EXTUBATION AND FLEXIBLE LARYNGOSCOPE (N/A)  Patient location during evaluation: ICU Anesthesia Type: MAC Level of consciousness: awake and patient cooperative Pain management: pain level controlled Vital Signs Assessment: post-procedure vital signs reviewed and stable Respiratory status: spontaneous breathing and patient connected to face mask oxygen Cardiovascular status: blood pressure returned to baseline and stable Postop Assessment: no signs of nausea or vomiting Anesthetic complications: no    Last Vitals:  Vitals:   10/20/16 1146 10/20/16 1152  BP: (!) 137/91   Pulse: 75   Resp: (!) 24   Temp:  36.9 C    Last Pain:  Vitals:   10/20/16 1152  TempSrc: Oral  PainSc:                  [name]

## 2016-10-20 NOTE — Anesthesia Preprocedure Evaluation (Addendum)
Anesthesia Evaluation  Patient identified by MRN, date of birth, ID band Patient unresponsive    Reviewed: Allergy & Precautions, NPO status , Patient's Chart, lab work & pertinent test results, Unable to perform ROS - Chart review only  History of Anesthesia Complications (+) DIFFICULT AIRWAY and history of anesthetic complications  Airway Mallampati: Intubated      Comment: 6.5 oral ETT placed 8/1 with glidescope #4, 2 attempts Dental  (+) Teeth Intact, Dental Advisory Given   Pulmonary shortness of breath, asthma , sleep apnea , COPD, former smoker,  Acute hypoxic respiratory failure secondary to complications from TEE   breath sounds clear to auscultation       Cardiovascular hypertension, + angina +CHF  + dysrhythmias Atrial Fibrillation + pacemaker  Rhythm:Irregular Rate:Normal  TEE 7/27:  ?stunned LV with severe hypokinesis and EF 20-25% and non obstructive HOCM  TTE 7/28: LVEF 55-60%, moderate asymmetric septal hypertrophy, intermittent diastolic dysfunction (AF), no RWMA, PA peak 62  Impressions: - The patient was in atrial fibrillation. Normal LV size with   moderate asymmetric septal hypertrophy. No LVOT gradient or   mitral valve SAM. EF 55-60%. This could be consistent with   hypertrophic nonobstructive cardiomyopathy. There was mild MR.   Mildly dilated RV with moderately decreased systolic function.   Moderate pulmonary hypertension.   Neuro/Psych  Headaches, PSYCHIATRIC DISORDERS Anxiety Depression Questionable seizures, EEG negative    GI/Hepatic Neg liver ROS, GERD  ,(+)     substance abuse (h/o alcohol abuse)  , Fatty liver H/o GI bleed   Endo/Other  negative endocrine ROSHyperglycemia likely secondary to high-dose steroids for airway edema  Renal/GU Renal InsufficiencyRenal disease     Musculoskeletal  (+) Arthritis , Polymyalgia rheumatic on chronic steroids (prednisone 5mg  daily)     Abdominal   Peds  Hematology  (+) Blood dyscrasia, anemia ,   Anesthesia Other Findings Day of surgery medications reviewed with the patient.  Reproductive/Obstetrics                             Anesthesia Physical  Anesthesia Plan  ASA: IV  Anesthesia Plan: MAC   Post-op Pain Management:    Induction: Inhalational  Airway Management Planned: Mask  Additional Equipment:   Intra-op Plan:   Post-operative Plan: Extubation in OR and Possible Post-op intubation/ventilation  Informed Consent: I have reviewed the patients History and Physical, chart, labs and discussed the procedure including the risks, benefits and alternatives for the proposed anesthesia with the patient or authorized representative who has indicated his/her understanding and acceptance.   Dental advisory given  Plan Discussed with: CRNA and Surgeon  Anesthesia Plan Comments: (ICU bring back, intubated, sedated. Plan to extubate in OR and observe)       Anesthesia Quick Evaluation

## 2016-10-20 NOTE — Care Management Important Message (Signed)
Important Message  Patient Details  Name: [name] MRN: [id number] Date of Birth: Jul 29, 1946   Medicare Important Message Given:  Yes    Nathen May 10/20/2016, 11:31 AM

## 2016-10-20 NOTE — H&P (View-Only) (Signed)
10/17/2016 12:12 PM  [name], [name] [id number]  Post-Op Day 1    Temp:  [97.4 F (36.3 C)-97.6 F (36.4 C)] 97.5 F (36.4 C) (11/03 1128) Pulse Rate:  [70-75] 70 (11/03 1209) Resp:  [5-17] 15 (11/03 1209) BP: (126-174)/(79-108) 174/97 (11/03 1209) SpO2:  [100 %] 100 % (11/03 1209) FiO2 (%):  [40 %] 40 % (11/03 1209) Weight:  [74.8 kg (164 lb 14.5 oz)] 74.8 kg (164 lb 14.5 oz) (11/03 0417),     Intake/Output Summary (Last 24 hours) at 10/17/16 1212 Last data filed at 10/17/16 1000  Gross per 24 hour  Intake          2241.42 ml  Output              765 ml  Net          1476.42 ml    Results for orders placed or performed during the hospital encounter of 10/15/16 (from the past 24 hour(s))  Glucose, capillary     Status: Abnormal   Collection Time: 10/16/16  8:10 PM  Result Value Ref Range   Glucose-Capillary 169 (H) 65 - 99 mg/dL   Comment 1 Notify RN    Comment 2 Document in Chart   Glucose, capillary     Status: Abnormal   Collection Time: 10/16/16 11:35 PM  Result Value Ref Range   Glucose-Capillary 153 (H) 65 - 99 mg/dL   Comment 1 Notify RN    Comment 2 Document in Chart   Magnesium     Status: None   Collection Time: 10/17/16  2:19 AM  Result Value Ref Range   Magnesium 2.2 1.7 - 2.4 mg/dL  Phosphorus     Status: None   Collection Time: 10/17/16  2:19 AM  Result Value Ref Range   Phosphorus 4.4 2.5 - 4.6 mg/dL  Basic metabolic panel     Status: Abnormal   Collection Time: 10/17/16  2:19 AM  Result Value Ref Range   Sodium 143 135 - 145 mmol/L   Potassium 3.6 3.5 - 5.1 mmol/L   Chloride 107 101 - 111 mmol/L   CO2 26 22 - 32 mmol/L   Glucose, Bld 168 (H) 65 - 99 mg/dL   BUN 25 (H) 6 - 20 mg/dL   Creatinine, Ser 2.15 (H) 0.44 - 1.00 mg/dL   Calcium 9.8 8.9 - 10.3 mg/dL   GFR calc non Af Amer 22 (L) >60 mL/min   GFR calc Af Amer 25 (L) >60 mL/min   Anion gap 10 5 - 15  CBC     Status: Abnormal   Collection Time: 10/17/16  2:19 AM  Result Value  Ref Range   WBC 15.2 (H) 4.0 - 10.5 K/uL   RBC 3.67 (L) 3.87 - 5.11 MIL/uL   Hemoglobin 9.4 (L) 12.0 - 15.0 g/dL   HCT 31.9 (L) 36.0 - 46.0 %   MCV 86.9 78.0 - 100.0 fL   MCH 25.6 (L) 26.0 - 34.0 pg   MCHC 29.5 (L) 30.0 - 36.0 g/dL   RDW 15.3 11.5 - 15.5 %   Platelets 335 150 - 400 K/uL  Glucose, capillary     Status: Abnormal   Collection Time: 10/17/16  3:14 AM  Result Value Ref Range   Glucose-Capillary 154 (H) 65 - 99 mg/dL   Comment 1 Notify RN    Comment 2 Document in Chart   Glucose, capillary     Status: Abnormal   Collection Time: 10/17/16  7:49 AM  Result Value Ref Range   Glucose-Capillary 141 (H) 65 - 99 mg/dL   Comment 1 Notify RN    Comment 2 Document in Chart   Glucose, capillary     Status: Abnormal   Collection Time: 10/17/16 11:26 AM  Result Value Ref Range   Glucose-Capillary 138 (H) 65 - 99 mg/dL   Comment 1 Notify RN    Comment 2 Document in Chart     SUBJECTIVE:  Agitated.  Ventilating well.  OBJECTIVE:   No change in exam.    IMPRESSION:  Satisfactory check.  Subglottic stenosis with inadequate airway  PLAN:  Will extubate in OR Monday, scheduled for 1145.   Direct laryngoscopy, possible tracheostomy at that time.  Discussed with patient and family.  Orders written.    [name]

## 2016-10-20 NOTE — Op Note (Signed)
10/20/2016  1:38 PM    Rudean Curt  [id number]   Pre-Op Dx:  Subglottic stenosis with prolonged intubation  Post-op Dx:  same  Proc: extubation under anesthesia. Flexible laryngoscopy.   Surg:  [name] T MD  Anes:  Intravenous sedation  EBL: None  Comp:None  Findings: Polypoid changes of both vocal cords which are mobile. Decent airway.  Minimal stridor and some phonation upon extubation.  Stable after 20 minutes.  Procedure:  The patient came into the operating room on the bed and was transferred to the operating table.  Intravenous fentanyl sedation was lowered. The patient resumed spontaneous ventilation. 5-10 minutes were allowed for her to get stronger. She was able to follow commands including taking a deep breath and squeezing fingers.  The pharynx was suctioned clear and she was extubated. She was breathing comfortably and without labor. She was moving good air and maintaining oxygen saturation.  The flexible laryngoscope was introduced through the right nostril and the larynx was inspected with the findings as described above. The scope was removed.  She was observed 20 minutes in a semisitting position with a stable airway. She was transferred back to the medical intensive care unit breathing spontaneously.  Dispo:  OR to [facility]  Plan:  Will observe carefully for immediate postextubation swelling and for longer term development of recurrent fibrosis/stenosis.  Tyson Alias MD

## 2016-10-20 NOTE — Progress Notes (Signed)
[facility] PCCM  I have seen and examined the patient with nurse practitioner/resident and agree with the note above.  We formulated the plan together and I elicited the following history.    Chart reviewed, history of subglottic stenosis, had ablation procedure by ENT on 11/2, required intubation Overnight no major issues, hypertension noted  Lungs clear, normal effort Belly soft CV: RRR, no mgr  Angioedema> to OR today for hopeful extubation, may need tracheostomy CKD> numbers improving, continue lasix CHF, diastolic> continue lasix, hydralazine  My cc time 31 minutes  Roselie Awkward, MD [place] PCCM Pager: [phone] Cell: [phone] After 3pm or if no response, call [phone]

## 2016-10-20 NOTE — Transfer of Care (Signed)
Immediate Anesthesia Transfer of Care Note  Patient: [name]  Procedure(s) Performed: Procedure(s): EXTUBATION AND FLEXIBLE LARYNGOSCOPE (N/A)  Patient Location: ICU  Anesthesia Type:MAC  Level of Consciousness: awake and patient cooperative  Airway & Oxygen Therapy: Patient Spontanous Breathing and Patient connected to face mask oxygen  Post-op Assessment: Report given to RN and Post -op Vital signs reviewed and stable  Post vital signs: Reviewed and stable  Last Vitals:  Vitals:   10/20/16 1146 10/20/16 1152  BP: (!) 137/91   Pulse: 75   Resp: (!) 24   Temp:  36.9 C    Last Pain:  Vitals:   10/20/16 1152  TempSrc: Oral  PainSc:          Complications: No apparent anesthesia complications

## 2016-10-20 NOTE — Care Management Note (Signed)
Case Management Note  Patient Details  Name: [name] MRN: [id number] Date of Birth: 10/26/1946  Subjective/Objective:     Pt admitted with worsening dyspnea and stridor - hc of laryngeal stenosis          Action/Plan:   PTA pt from home with adult son.  Pt ventilated during assessment - daughter and son at bedside.  Per son pt was independent prior to admit - recently discharged from [facility].  Pt would benefit from [facility] for disease management.    Expected Discharge Date:                  Expected Discharge Plan:     In-House Referral:     Discharge planning Services  CM Consult  Post Acute Care Choice:    Choice offered to:     DME Arranged:    DME Agency:     HH Arranged:    HH Agency:     Status of Service:  In process, will continue to follow  If discussed at Long Length of Stay Meetings, dates discussed:    Additional Comments: 10/20/2016 Pt extubated today.  Family at [place], [place], RN 10/20/2016, 2:04 PM

## 2016-10-20 NOTE — Progress Notes (Signed)
Patient ID: [name], female   DOB: 1946-02-23, 70 y.o.   MRN: [id number] Seen on rounds, 45 minutes post racemic epi treatment. She is breathing nice and quietly now, moving air well, no audible stridor or distress. Continue care and re-intubate if needed.

## 2016-10-21 ENCOUNTER — Inpatient Hospital Stay (HOSPITAL_COMMUNITY): Payer: Medicare Other

## 2016-10-21 ENCOUNTER — Encounter (HOSPITAL_COMMUNITY): Payer: Self-pay | Admitting: Otolaryngology

## 2016-10-21 LAB — TROPONIN I
TROPONIN I: 1.06 ng/mL — AB (ref <0.03)
TROPONIN I: 1 ng/mL — AB (ref <0.03)
Troponin I: 1.09 ng/mL (ref <0.03)

## 2016-10-21 LAB — GLUCOSE, CAPILLARY
GLUCOSE-CAPILLARY: 232 mg/dL — AB (ref 65–99)
GLUCOSE-CAPILLARY: 198 mg/dL — AB (ref 65–99)
Glucose-Capillary: 152 mg/dL — ABNORMAL HIGH (ref 65–99)
Glucose-Capillary: 167 mg/dL — ABNORMAL HIGH (ref 65–99)
Glucose-Capillary: 227 mg/dL — ABNORMAL HIGH (ref 65–99)
Glucose-Capillary: 240 mg/dL — ABNORMAL HIGH (ref 65–99)
Glucose-Capillary: 245 mg/dL — ABNORMAL HIGH (ref 65–99)

## 2016-10-21 LAB — BASIC METABOLIC PANEL
Anion gap: 13 (ref 5–15)
BUN: 52 mg/dL — ABNORMAL HIGH (ref 6–20)
CHLORIDE: 103 mmol/L (ref 101–111)
CO2: 31 mmol/L (ref 22–32)
CREATININE: 1.4 mg/dL — AB (ref 0.44–1.00)
Calcium: 10.4 mg/dL — ABNORMAL HIGH (ref 8.9–10.3)
GFR calc non Af Amer: 37 mL/min — ABNORMAL LOW (ref >60)
GFR, EST AFRICAN AMERICAN: 42 mL/min — AB (ref >60)
GLUCOSE: 234 mg/dL — AB (ref 65–99)
Potassium: 3.2 mmol/L — ABNORMAL LOW (ref 3.5–5.1)
Sodium: 147 mmol/L — ABNORMAL HIGH (ref 135–145)

## 2016-10-21 LAB — PHOSPHORUS: Phosphorus: 2.7 mg/dL (ref 2.5–4.6)

## 2016-10-21 LAB — CBC
HCT: 35.9 % — ABNORMAL LOW (ref 36.0–46.0)
HEMOGLOBIN: 10.6 g/dL — AB (ref 12.0–15.0)
MCH: 25.7 pg — AB (ref 26.0–34.0)
MCHC: 29.5 g/dL — ABNORMAL LOW (ref 30.0–36.0)
MCV: 86.9 fL (ref 78.0–100.0)
PLATELETS: 393 10*3/uL (ref 150–400)
RBC: 4.13 MIL/uL (ref 3.87–5.11)
RDW: 16.1 % — ABNORMAL HIGH (ref 11.5–15.5)
WBC: 8.3 10*3/uL (ref 4.0–10.5)

## 2016-10-21 LAB — MAGNESIUM: Magnesium: 2.2 mg/dL (ref 1.7–2.4)

## 2016-10-21 MED ORDER — POTASSIUM CHLORIDE 20 MEQ/15ML (10%) PO SOLN
30.0000 meq | ORAL | Status: AC
  Administered 2016-10-21 (×2): 30 meq
  Filled 2016-10-21 (×2): qty 30

## 2016-10-21 MED ORDER — HYDRALAZINE HCL 20 MG/ML IJ SOLN
10.0000 mg | INTRAMUSCULAR | Status: DC | PRN
  Administered 2016-10-22 – 2016-10-23 (×2): 10 mg via INTRAVENOUS
  Filled 2016-10-21 (×2): qty 1

## 2016-10-21 MED ORDER — HEPARIN (PORCINE) IN NACL 100-0.45 UNIT/ML-% IJ SOLN: 750.0000 [IU]/h | INTRAMUSCULAR | Status: DC

## 2016-10-21 MED ORDER — HEPARIN BOLUS VIA INFUSION
2000.0000 [IU] | Freq: Once | INTRAVENOUS | Status: DC
  Filled 2016-10-21: qty 2000

## 2016-10-21 MED ORDER — HEPARIN SODIUM (PORCINE) 5000 UNIT/ML IJ SOLN
5000.0000 [IU] | Freq: Three times a day (TID) | INTRAMUSCULAR | Status: DC
  Administered 2016-10-21 – 2016-11-04 (×40): 5000 [IU] via SUBCUTANEOUS
  Filled 2016-10-21 (×37): qty 1

## 2016-10-21 MED ORDER — BISACODYL 10 MG RE SUPP
10.0000 mg | Freq: Every day | RECTAL | Status: DC | PRN
  Administered 2016-10-21: 10 mg via RECTAL
  Filled 2016-10-21 (×2): qty 1

## 2016-10-21 NOTE — Consult Note (Signed)
NEURO HOSPITALIST CONSULT NOTE   Requestig physician: PCCM   Reason for Consult: AMS/ Encephalopathy/Abnormal upward gaze ocular bobbing   History obtained from:  Patient   Chart   HPI:                                                                                                                                          [name] is an 70 y.o. female multiple medical problems including dCHF, GERD, hx ETOH, AFib, CKD, angioedema with chronic upper airway narrowing after an intubation with recent laryngoscopy by ENT with removal of granulation tissue 3 weeks prior to admit but has continued to have dyspnea and stridor.  She presented 11/1 to ER with ongoing dyspnea and stridor.  She initially improved with steroids, BD and ativan and was seen by ENT who scheduled direct laryngoscopy for 11/2 but wanted cardiology clearance first r/t troponin 0.57.  Over time she became increasingly stridorous, tripoding, diaphoretic and required intubation in ER. 11/ 6 Extubated in OR, stridor after extubation ICU.  After extubation she was noted to be able to speak although she has slow thought processing. It was also noted that when she was resting she would have vertical ocular bobbing of her eyes. These movements would easily be stopped when she was taught to and she would focus. But when resting the would again occur. Patient is aware they are occurring. Daughter states that previously she has been encephalopathic but just stared forward without the ocular bobbing. Patient does have a pacemaker which was recently exchanged and possibly could be MRI compatible. EEG has been ordered and neurology was asked to observe.   While hospitalized patient's glucose has been fluctuating between 1:30-234, creatinine has improved from 2.17-1.4  CT of head showed mild frontal and parietal lobe atrophy bilaterally but no intracranial mass hemorrhage or extra-axial fluid collection.  No acute  stroke   Past Medical History:  Diagnosis Date  . Anemia   . Angina   . Angioedema    2/2 ACE  . Arteriovenous malformation of stomach   . Arthritis   . Asthma   . AVM (arteriovenous malformation) of colon    small intestine; stomach  . Blood transfusion   . CHF (congestive heart failure) ([place])   . Complete heart block ([place])    s/p PPM 1998  . Complication of anesthesia    Difficult airway; anaphylaxis and swelling with propofol  . DDD (degenerative disc disease)   . Depression   . Diastolic heart failure   . Fatty liver 07/26/10  . GERD (gastroesophageal reflux disease)   . GI bleed   . Headache   . History of alcohol abuse Stopped Fall 2012  . History of tobacco use Quit Fall 2012  . Hx of cardiovascular  stress test    a. Lexiscan Myoview (10/15):  Small inferolateral and apical defect c/w scar and poss soft tissue attenuation, no ischemia, EF 42%  . Hx of colonic polyp 08/13/10   adenomatous  . Hx of colonoscopy   . Hyperlipidemia   . Hyperlipidemia   . Hypertension   . Hypertrophic cardiomyopathy ([place])    dx by Dr [name] 2009  . Iron deficiency anemia   . Panic attack   . Panic attacks   . Permanent atrial fibrillation ([place])   . Pneumonia   . Renal failure    baseline creatinine 1.6  . Right arm pain 01/08/2012  . RLS (restless legs syndrome)    Dx 06/2007  . Shortness of breath    sob on exertation  . Sleep apnea     Past Surgical History:  Procedure Laterality Date  . CARDIAC CATHETERIZATION    . CARDIAC CATHETERIZATION N/A 08/19/2016   Procedure: Right/Left Heart Cath and Coronary Angiography;  Surgeon: Jolaine Artist, MD;  Location: [facility];  Service: Cardiovascular;  Laterality: N/A;  . DIRECT LARYNGOSCOPY N/A 09/18/2016   Procedure: DIRECT LARYNGOSCOPY, BRONCHOSCOPY, REMOVAL OF INTUBATION GRANULOMA;  Surgeon: [name], MD;  Location: [facility] OR;  Service: ENT;  Laterality: N/A;  . ESOPHAGOGASTRODUODENOSCOPY  12/23/2011   Procedure:  ESOPHAGOGASTRODUODENOSCOPY (EGD);  Surgeon: [name], MD;  Location: [facility];  Service: Endoscopy;  Laterality: N/A;  . EXTUBATION (ENDOTRACHEAL) IN OR N/A 07/21/2016   Procedure: EXTUBATION (ENDOTRACHEAL) IN OR;  Surgeon: [name], MD;  Location: [facility];  Service: ENT;  Laterality: N/A;  . GIVENS CAPSULE STUDY  12/23/2011   Procedure: GIVENS CAPSULE STUDY;  Surgeon: [name], MD;  Location: [facility];  Service: Endoscopy;  Laterality: N/A;  . MICROLARYNGOSCOPY WITH LASER N/A 10/16/2016   Procedure: MICRODIRECTLARYNGOSCOPY WITH LASER ABLATION AND KENLOG INJECTION;  Surgeon: [name], MD;  Location: [facility];  Service: ENT;  Laterality: N/A;  . PACEMAKER INSERTION  1998   st jude, most recent gen change by [name] 4/12  . TUBAL LIGATION  04/01/2000    Family History  Problem Relation Age of Onset  . Hypertension Mother   . Stroke Mother   . Heart disease Mother   . Aneurysm Mother   . CVA Mother   . Hypertension Father   . Stroke Father   . Heart attack Father   . Alcohol abuse Father   . Hypertension Sister   . Hypertension Brother   . Colon cancer Brother   . Diabetes Sister   . Diabetes Brother   . Cancer Brother   . Heart attack Brother   . Heart disease Sister   . Alcohol abuse Brother      Social History:  reports that she quit smoking about 5 years ago. Her smoking use included Cigarettes. She has a 5.00 pack-year smoking history. She has never used smokeless tobacco. She reports that she drinks alcohol. She reports that she does not use drugs.  Allergies  Allergen Reactions  . Ace Inhibitors Swelling    ANGIOEDEMA  . Other Anaphylaxis and Swelling    UNSPECIFIED ANESTHESIA  . Propofol Anaphylaxis and Swelling    MEDICATIONS:  Scheduled: . aspirin  81 mg Per Tube Daily  . chlorhexidine gluconate (MEDLINE KIT)  15 mL Mouth Rinse BID  .  dexamethasone  4 mg Intravenous Q6H  . feeding supplement (PRO-STAT SUGAR FREE 64)  30 mL Per Tube BID  . feeding supplement (VITAL AF 1.2 CAL)  1,000 mL Per Tube Q24H  . furosemide  40 mg Oral Daily  . heparin  5,000 Units Subcutaneous Q8H  . insulin aspart  0-9 Units Subcutaneous Q4H  . mouth rinse  15 mL Mouth Rinse QID  . pantoprazole sodium  40 mg Per Tube Daily  . polyethylene glycol  17 g Oral Daily  . rosuvastatin  20 mg Oral Daily     ROS:                                                                                                                                       History obtained from unobtainable from patient due to mental status    Blood pressure (!) 141/82, pulse 74, temperature 98.4 F (36.9 C), temperature source Oral, resp. rate (!) 25, height '5\' 2"'  (1.575 m), weight 68.4 kg (150 lb 12.7 oz), SpO2 100 %.   Neurologic Examination:                                                                                                      HEENT-  Normocephalic, no lesions, without obvious abnormality.  Normal external eye and conjunctiva.  Normal TM's bilaterally.  Normal auditory canals and external ears. Normal external nose, mucus membranes and septum.  Normal pharynx. Cardiovascular- S1, S2 normal, pulses palpable throughout   Lungs- chest clear, no wheezing, rales, normal symmetric air entry, Heart exam - S1, S2 normal, no murmur, no gallop, rate regular Abdomen- normal findings: bowel sounds normal Extremities- no edema Lymph-no adenopathy palpable Musculoskeletal-no joint tenderness, deformity or swelling Skin-warm and dry, no hyperpigmentation, vitiligo, or suspicious lesions  Neurological Examination Mental Status: Alert, oriented to [facility] however she is unsure of the date or the year. Thought process is slow. Speech is hypophonic but shows no aphasia.  Able to follow only simple commands such as showing her thumb raising her arms, smiling and  following my finger. At rest patient has her eyes closed and shows vertical bobbing of bilateral eyes however when spoken to she quickly opens her eyes and will focus on practitioner and follow me throughout the room along with move her eyes to command Cranial Nerves: II:  Visual fields grossly normal, pupils equal, round, reactive to light and accommodation III,IV, VI: ptosis not present, extra-ocular motions intact bilaterally V,VII: smile symmetric, facial light touch sensation normal bilaterally VIII: hearing normal bilaterally IX,X: uvula rises symmetrically XI: bilateral shoulder shrug XII: midline tongue extension Motor: Upper extremities show a 3/5 strength throughout with lower extremity 2/5. Sensory: Sensation is intact throughout Deep Tendon Reflexes: Deep tendon reflexes are depressed throughout Plantars: Mute bilaterally Cerebellar: Unable to assess secondary to difficulty performing action and weakness Gait: Not tested      Lab Results: Basic Metabolic Panel:  Recent Labs Lab 10/17/16 0219 10/18/16 0224 10/19/16 0315 10/20/16 0256 10/21/16 0209  NA 143 145 146* 146* 147*  K 3.6 3.6 4.2 3.8 3.2*  CL 107 106 108 104 103  CO2 '26 29 29 31 31  ' GLUCOSE 168* 130* 155* 171* 234*  BUN 25* 39* 49* 48* 52*  CREATININE 2.15* 2.17* 1.59* 1.37* 1.40*  CALCIUM 9.8 9.8 9.6 10.0 10.4*  MG 2.2 2.2 2.3 2.2 2.2  PHOS 4.4 4.2 3.3 1.8* 2.7    Liver Function Tests:  Recent Labs Lab 10/15/16 1040  AST 30  ALT 9*  ALKPHOS 54  BILITOT 0.5  PROT 7.3  ALBUMIN 3.4*   No results for input(s): LIPASE, AMYLASE in the last 168 hours. No results for input(s): AMMONIA in the last 168 hours.  CBC:  Recent Labs Lab 10/15/16 1040  10/17/16 0219 10/18/16 0224 10/19/16 0315 10/20/16 0256 10/21/16 0209  WBC 12.9*  < > 15.2* 12.0* 8.0 9.1 8.3  NEUTROABS 5.9  --   --   --   --   --   --   HGB 10.5*  < > 9.4* 10.1* 10.0* 9.9* 10.6*  HCT 34.4*  < > 31.9* 33.5* 33.3* 33.4*  35.9*  MCV 87.3  < > 86.9 86.1 87.6 86.3 86.9  PLT 363  < > 335 353 278 357 393  < > = values in this interval not displayed.  Cardiac Enzymes:  Recent Labs Lab 10/15/16 1619 10/15/16 2121 10/16/16 0252 10/21/16 0843  TROPONINI 0.93* 1.03* 1.00* 1.09*    Lipid Panel: No results for input(s): CHOL, TRIG, HDL, CHOLHDL, VLDL, LDLCALC in the last 168 hours.  CBG:  Recent Labs Lab 10/20/16 1507 10/20/16 1956 10/20/16 2350 10/21/16 0350 10/21/16 0759  GLUCAP 143* 188* 245* 240* 1*    Microbiology: Results for orders placed or performed during the hospital encounter of 10/15/16  MRSA PCR Screening     Status: None   Collection Time: 10/15/16  6:31 PM  Result Value Ref Range Status   MRSA by PCR NEGATIVE NEGATIVE Final    Comment:        The GeneXpert MRSA Assay (FDA approved for NASAL specimens only), is one component of a comprehensive MRSA colonization surveillance program. It is not intended to diagnose MRSA infection nor to guide or monitor treatment for MRSA infections.     Coagulation Studies: No results for input(s): LABPROT, INR in the last 72 hours.  Imaging: Ct Head Wo Contrast  Result Date: 10/21/2016 CLINICAL DATA:  Seizure.  Nonresponsiveness EXAM: CT HEAD WITHOUT CONTRAST TECHNIQUE: Contiguous axial images were obtained from the base of the skull through the vertex without intravenous contrast. COMPARISON:  July 15, 2016 FINDINGS: Brain: The ventricles are normal in size and configuration. There is mild frontal and parietal lobe atrophy bilaterally. There is no intracranial mass, hemorrhage, extra-axial fluid collection, or midline shift gray-white compartments appear unremarkable. No acute  infarct evident. Vascular: No hyperdense vessel evident. There is calcification in both carotid siphon regions. There is also calcification in the distal left vertebral artery. Skull: The bony calvarium appears intact. Sinuses/Orbits: There is extensive  opacification with retention cysts and polyps in both maxillary antra. There is mild mucosal thickening in multiple ethmoid air cells bilaterally. Orbits appear symmetric bilaterally. Other: Mastoid air cells are clear. IMPRESSION: Mild frontal and parietal lobe atrophy bilaterally. No intracranial mass hemorrhage, or extra-axial fluid collection. No focal gray - white compartment lesions. There is calcification in both carotid siphon regions. There is extensive opacity in both maxillary antra with much milder mucosal thickening in several ethmoid air cells bilaterally. Electronically Signed   By: Lowella Grip III M.D.   On: 10/21/2016 10:27   Dg Chest Port 1 View  Result Date: 10/21/2016 CLINICAL DATA:  Stridor and dyspnea EXAM: PORTABLE CHEST 1 VIEW COMPARISON:  October 17, 2016 FINDINGS: Enteric tube tip is in the stomach. Pacemaker leads are attached to the right atrium and right ventricle. No pneumothorax. There is atelectatic change in the left lower lobe. Lungs elsewhere are clear. Heart is mildly enlarged with pulmonary vascularity within normal limits. No adenopathy. There is atherosclerotic calcification in the aorta. IMPRESSION: Enteric tube tip in stomach. Atelectasis left base. No edema or consolidation. Stable cardiac silhouette. There is aortic atherosclerosis. Electronically Signed   By: Lowella Grip III M.D.   On: 10/21/2016 10:22       Assessment and plan per attending neurologist  Etta Quill PA-C [facility] [phone]  10/21/2016, 11:12 AM   Assessment/Plan: 70 year old female with altered mental status in the setting of ICU admission. Suspect that she has some degree of ICU-related delirium. The abnormal eye movements are unusual, but the fact that she stops with wakening essentially rules out seizure as the etiology. I do think that further evaluation with EEG would be prudent, MRI if possible.  1) MRI brain if possible 2) EEG 3) we will continue  to follow  Roland Rack, MD [facility] [phone]  If 7pm- 7am, please page neurology on call as listed in [place].

## 2016-10-21 NOTE — Significant Event (Signed)
Asked by patient's primary RN to adjust Cortrak feeding tube as she is unable to flush air and fluids through it. RN adjusted existing tube using cortrak machine. Tube is now marked at 82cm by the nare. Able to flush fluids through tube after procedure was completed. Ajeet Casasola, Therapist, sports.

## 2016-10-21 NOTE — Progress Notes (Signed)
[facility] Progress Note Patient Name: [name] DOB: [birth year] MRN: [id number]   Date of Service  10/21/2016  HPI/Events of Note  Nurse reports patient was somewhat delayed response. No complaining of "aching" chest pain. Currently on aspirin 81 mg daily along with Crestor. Paced rhythm.   eICU Interventions  1. Continuing close monitoring 2. Troponin I every 6 hours 3      Intervention Category Intermediate Interventions: Pain - evaluation and management  Tera Partridge 10/21/2016, 4:08 AM

## 2016-10-21 NOTE — Progress Notes (Signed)
EEG completed, results pending. 

## 2016-10-21 NOTE — Progress Notes (Signed)
 Patient Name: [name] Date of Encounter: 10/21/2016  Hospital Problem List     Principal Problem:   Respiratory failure requiring intubation (HCC) Active Problems:   Tracheal stenosis    Patient Profile     70F with a history of hypertension, hyperlipidemia, hypertrophic cardiomyopathy, longstanding persistent AF, GI bleeding not on anticoagulation, CHB s/p PPM, CKD III, and COPD here with respiratory arrest requiring intubation.  Subjective   She reports that she was having chest pain but is vague about this.  Currently no chest pain.  Troponin is elevated.  EKG is paced.   Inpatient Medications    . aspirin  81 mg Per Tube Daily  . chlorhexidine gluconate (MEDLINE KIT)  15 mL Mouth Rinse BID  . dexamethasone  4 mg Intravenous Q6H  . feeding supplement (PRO-STAT SUGAR FREE 64)  30 mL Per Tube BID  . feeding supplement (VITAL AF 1.2 CAL)  1,000 mL Per Tube Q24H  . furosemide  40 mg Oral Daily  . heparin  2,000 Units Intravenous Once  . insulin aspart  0-9 Units Subcutaneous Q4H  . mouth rinse  15 mL Mouth Rinse QID  . pantoprazole sodium  40 mg Per Tube Daily  . polyethylene glycol  17 g Oral Daily  . rosuvastatin  20 mg Oral Daily    Vital Signs    Vitals:   10/21/16 0422 10/21/16 0500 10/21/16 0600 10/21/16 0801  BP:  140/77 (!) 141/82   Pulse:  74 74   Resp:  (!) 25 (!) 25   Temp:    98.4 F (36.9 C)  TempSrc:    Oral  SpO2:  100% 100%   Weight: 150 lb 12.7 oz (68.4 kg)     Height:        Intake/Output Summary (Last 24 hours) at 10/21/16 1038 Last data filed at 10/21/16 0600  Gross per 24 hour  Intake           1079.3 ml  Output             1680 ml  Net           -600.7 ml   Filed Weights   10/19/16 0500 10/20/16 0445 10/21/16 0422  Weight: 162 lb 7.7 oz (73.7 kg) 154 lb 15.7 oz (70.3 kg) 150 lb 12.7 oz (68.4 kg)    Physical Exam    GEN: Well nourished, well developed, in  no acute distress.  Neck: Supple, no JVD, carotid bruits, or  masses. Cardiac: RRR, no rubs, or gallops. No clubbing, cyanosis, no edema.  Radials/DP/PT 2+ and equal bilaterally.  Respiratory:  Decreased breath sounds GI: Soft, nontender, nondistended, BS + x 4. Neuro:  Strength and sensation are intact.   Labs    CBC  Recent Labs  10/20/16 0256 10/21/16 0209  WBC 9.1 8.3  HGB 9.9* 10.6*  HCT 33.4* 35.9*  MCV 86.3 86.9  PLT 357 393   Basic Metabolic Panel  Recent Labs  10/20/16 0256 10/21/16 0209  NA 146* 147*  K 3.8 3.2*  CL 104 103  CO2 31 31  GLUCOSE 171* 234*  BUN 48* 52*  CREATININE 1.37* 1.40*  CALCIUM 10.0 10.4*  MG 2.2 2.2  PHOS 1.8* 2.7   Liver Function Tests No results for input(s): AST, ALT, ALKPHOS, BILITOT, PROT, ALBUMIN in the last 72 hours. No results for input(s): LIPASE, AMYLASE in the last 72 hours. Cardiac Enzymes  Recent Labs  10/21/16 0843  TROPONINI 1.09*     BNP Invalid input(s): POCBNP D-Dimer No results for input(s): DDIMER in the last 72 hours. Hemoglobin A1C No results for input(s): HGBA1C in the last 72 hours. Fasting Lipid Panel No results for input(s): CHOL, HDL, LDLCALC, TRIG, CHOLHDL, LDLDIRECT in the last 72 hours. Thyroid Function Tests No results for input(s): TSH, T4TOTAL, T3FREE, THYROIDAB in the last 72 hours.  Invalid input(s): FREET3  Telemetry    Paced rhythm  ECG    NSR with pace rhythm.    Radiology    Ct Head Wo Contrast  Result Date: 10/21/2016 CLINICAL DATA:  Seizure.  Nonresponsiveness EXAM: CT HEAD WITHOUT CONTRAST TECHNIQUE: Contiguous axial images were obtained from the base of the skull through the vertex without intravenous contrast. COMPARISON:  July 15, 2016 FINDINGS: Brain: The ventricles are normal in size and configuration. There is mild frontal and parietal lobe atrophy bilaterally. There is no intracranial mass, hemorrhage, extra-axial fluid collection, or midline shift gray-white compartments appear unremarkable. No acute infarct evident.  Vascular: No hyperdense vessel evident. There is calcification in both carotid siphon regions. There is also calcification in the distal left vertebral artery. Skull: The bony calvarium appears intact. Sinuses/Orbits: There is extensive opacification with retention cysts and polyps in both maxillary antra. There is mild mucosal thickening in multiple ethmoid air cells bilaterally. Orbits appear symmetric bilaterally. Other: Mastoid air cells are clear. IMPRESSION: Mild frontal and parietal lobe atrophy bilaterally. No intracranial mass hemorrhage, or extra-axial fluid collection. No focal gray - white compartment lesions. There is calcification in both carotid siphon regions. There is extensive opacity in both maxillary antra with much milder mucosal thickening in several ethmoid air cells bilaterally. Electronically Signed   By: William  Woodruff III M.D.   On: 10/21/2016 10:27   Dg Chest Port 1 View  Result Date: 10/21/2016 CLINICAL DATA:  Stridor and dyspnea EXAM: PORTABLE CHEST 1 VIEW COMPARISON:  October 17, 2016 FINDINGS: Enteric tube tip is in the stomach. Pacemaker leads are attached to the right atrium and right ventricle. No pneumothorax. There is atelectatic change in the left lower lobe. Lungs elsewhere are clear. Heart is mildly enlarged with pulmonary vascularity within normal limits. No adenopathy. There is atherosclerotic calcification in the aorta. IMPRESSION: Enteric tube tip in stomach. Atelectasis left base. No edema or consolidation. Stable cardiac silhouette. There is aortic atherosclerosis. Electronically Signed   By: William  Woodruff III M.D.   On: 10/21/2016 10:22   Dg Chest Port 1 View  Result Date: 10/17/2016 CLINICAL DATA:  Hypoxia EXAM: PORTABLE CHEST 1 VIEW COMPARISON:  October 16, 2016 FINDINGS: Endotracheal tube tip is 1.2 cm above the carina. Pacemaker leads attached to right atrium and right ventricle. No pneumothorax. There is no edema or consolidation. Heart is mildly  enlarged with pulmonary vascularity within normal limits. No adenopathy. There is aortic atherosclerosis. IMPRESSION: Endotracheal tube tip is 1.2 cm above the carina. It may be prudent to consider withdrawing endotracheal tube approximately 2 cm. No edema or consolidation. Aortic atherosclerosis. Pacemaker present with lead tips attached to right atrium and right ventricle. Electronically Signed   By: William  Woodruff III M.D.   On: 10/17/2016 10:07   Dg Chest Port 1 View  Result Date: 10/16/2016 CLINICAL DATA:  Respiratory failure. EXAM: PORTABLE CHEST 1 VIEW COMPARISON:  10/15/2016 FINDINGS: Endotracheal tube is 3.3 cm above the carina. Nasogastric tube extends to the abdomen and appears to extend into the proximal duodenum. Stable position of the right cardiac pacemaker leads. Heart size is mildly enlarged. Densities near   the left costophrenic angle could represent atelectasis or small effusion. No evidence for pulmonary edema. Negative for a pneumothorax. IMPRESSION: Increased left basilar densities. This could represent atelectasis or small effusion. Support apparatuses as described. Electronically Signed   By: Adam  Henn M.D.   On: 10/16/2016 07:10   Dg Chest Portable 1 View  Result Date: 10/15/2016 CLINICAL DATA:  Intubation. Asthma. Congestive heart failure. Atrial fibrillation. EXAM: PORTABLE CHEST 1 VIEW COMPARISON:  10/15/2016 FINDINGS: Endotracheal tube tip 3.5 cm above the carina. Nasogastric tube enters the stomach. Dual lead pacer noted. Mild enlargement of the cardiopericardial silhouette. Upper zone pulmonary vascular prominence could be from supine positioning. Linear bandlike density in the left retrocardiac region potentially from atelectasis. IMPRESSION: 1. Tubes and lines satisfactorily position. 2. Mild enlargement of the cardiopericardial silhouette, without overt edema. 3. Left retrocardiac subsegmental atelectasis. Electronically Signed   By: Walter  Liebkemann M.D.   On:  10/15/2016 13:03   Dg Chest Portable 1 View  Result Date: 10/15/2016 CLINICAL DATA:  Respiratory distress, shortness of Breath EXAM: PORTABLE CHEST 1 VIEW COMPARISON:  08/17/2016 FINDINGS: Right pacer remains in place, unchanged. Cardiomegaly. No confluent opacities or effusions. No acute bony abnormality. IMPRESSION: Cardiomegaly.  No active disease. Electronically Signed   By: Kevin  Dover M.D.   On: 10/15/2016 12:13   Dg Abd Portable 1v  Result Date: 10/17/2016 CLINICAL DATA:  Evaluate feeding tube EXAM: PORTABLE ABDOMEN - 1 VIEW COMPARISON:  None. FINDINGS: The feeding tube is coiled within the stomach. IMPRESSION: The feeding tube is coiled within the stomach with the distal tip in the fundus. Electronically Signed   By: David  Williams III M.D   On: 10/17/2016 16:16    Assessment & Plan    ELEVATED TROPONIN:  Cath in 08/2016 with normal coronaries.    No further work up.  No indication for systemic heparin.    CHRONIC DIASTOLIC DYSFUNCTION:    On PO diuretic with I/Os about even.   HTN:  BP is somewhat labile.   Continue current therapy.  ATRIAL FIB:   Paced rhythm currently.   VT:  Probable.  Check magnesium and supplement as needed.    Signed,  , MD  10/21/2016, 10:38 AM  

## 2016-10-21 NOTE — Progress Notes (Signed)
[facility] Progress Note Patient Name: Marne Meline DOB: [birth year] MRN: [id number]   Date of Service  10/21/2016  HPI/Events of Note  Patient reportedly had a run of wide-complex tachycardia. Currently in a paced rhythm at 75 bpm approximately. Morning labs pending.   eICU Interventions  1. Continuing telemetry monitoring 2. Awaiting Labs      Intervention Category Major Interventions: Arrhythmia - evaluation and management  Tera Partridge 10/21/2016, 3:20 AM

## 2016-10-21 NOTE — Progress Notes (Signed)
ANTICOAGULATION CONSULT NOTE - Initial Consult  Pharmacy Consult for heparin Indication: chest pain/ACS  Allergies  Allergen Reactions  . Ace Inhibitors Swelling    ANGIOEDEMA  . Other Anaphylaxis and Swelling    UNSPECIFIED ANESTHESIA  . Propofol Anaphylaxis and Swelling   Patient Measurements: Height: 5\' 2"  (157.5 cm) Weight: 150 lb 12.7 oz (68.4 kg) IBW/kg (Calculated) : 50.1 Heparin Dosing Weight: 64kg   Vital Signs: Temp: 98.4 F (36.9 C) (11/07 0801) Temp Source: Oral (11/07 0801) BP: 141/82 (11/07 0600) Pulse Rate: 74 (11/07 0600)  Labs:  Recent Labs  10/19/16 0315 10/20/16 0256 10/21/16 0209 10/21/16 0843  HGB 10.0* 9.9* 10.6*  --   HCT 33.3* 33.4* 35.9*  --   PLT 278 357 393  --   CREATININE 1.59* 1.37* 1.40*  --   TROPONINI  --   --   --  1.09*    Estimated Creatinine Clearance: 32.9 mL/min (by C-G formula based on SCr of 1.4 mg/dL (H)).  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 She is s/p laser ablation and injection by ENT on 11/2 and is recently extubated (11/6). Patient complaining of chest pain today and has positive troponin 1.09. Pharmacy consulted to dose heparin. Patient not on any oral anticoagulation prior to admission. She has been receiving heparin SQ, with last dose 0600 on 11/7. Due to recent SQ heparin, conservative bolus given. CBC low, but stable and platelets within normal limits. No s/s bleeding noted.   Goal of Therapy: Heparin level 0.3-0.7 units/ml Monitor platelets by anticoagulation protocol: Yes   Plan:  Give 2000 units heparin IV x1  Start heparin gtt at 750 units/hr  Heparin level in 8 hours  D/C SQ heparin  Daily heparin level and CBC Monitor for s/s bleeding   Argie Ramming, PharmD Pharmacy Resident  Pager [phone] 10/21/16 10:00 AM

## 2016-10-21 NOTE — Progress Notes (Signed)
eLink Physician-Brief Progress Note Patient Name: Lauren Modisette DOB: 09/02/1946 MRN: [id number]   Date of Service  10/21/2016  HPI/Events of Note  Potassium 3.2. Creatinine 1.40. Magnesium 2.2. Has enteric feeding tube in place.   eICU Interventions  KCl 30 mEq by tube 2 doses     Intervention Category Intermediate Interventions: Electrolyte abnormality - evaluation and management  Tera Partridge 10/21/2016, 4:01 AM

## 2016-10-21 NOTE — Anesthesia Postprocedure Evaluation (Signed)
Anesthesia Post Note  Patient: [name]  Procedure(s) Performed: Procedure(s) (LRB): EXTUBATION AND FLEXIBLE LARYNGOSCOPE (N/A)  Patient location during evaluation: SICU Anesthesia Type: MAC Level of consciousness: oriented and awake Pain management: pain level controlled Vital Signs Assessment: post-procedure vital signs reviewed and stable Respiratory status: spontaneous breathing Cardiovascular status: stable Anesthetic complications: no    Last Vitals:  Vitals:   10/21/16 0600 10/21/16 0801  BP: (!) 141/82   Pulse: 74   Resp: (!) 25   Temp:  36.9 C    Last Pain:  Vitals:   10/21/16 0801  TempSrc: Oral  PainSc:                  [name],[name]

## 2016-10-21 NOTE — Progress Notes (Signed)
Pt. Had 20 beat run of V-Tach. Called [facility] and reported findings to [place], [place]. Currently in ventricular pacing rate of 75 bpm.

## 2016-10-21 NOTE — Procedures (Signed)
ELECTROENCEPHALOGRAM REPORT  Date of Study: 10/21/2016  Patient's Name: [name] MRN: [id number] Date of Birth: [birth year]  Referring Provider: Dr. Simonne Maffucci  Clinical History: This is a 70 year old woman with slow thought processing and ocular bobbing.  Medications: albuterol (PROVENTIL) (2.5 MG/3ML) 0.083% nebulizer solution 2.5 mg  aspirin chewable tablet 81 mg  bisacodyl (DULCOLAX) suppository 10 mg  chlorhexidine gluconate (MEDLINE KIT) (PERIDEX) 0.12 % solution 15 mL  dexamethasone (DECADRON) injection 4 mg  feeding supplement (PRO-STAT SUGAR FREE 64) liquid 30 mL  feeding supplement (VITAL AF 1.2 CAL) liquid 1,000 mL  furosemide (LASIX) tablet 40 mg  heparin injection 5,000 Units  hydrALAZINE (APRESOLINE) injection 10-40 mg  insulin aspart (novoLOG) injection 0-9 Units  labetalol (NORMODYNE,TRANDATE) injection 5 mg  MEDLINE mouth rinse  nitroGLYCERIN (NITROSTAT) SL tablet 0.4 mg  pantoprazole sodium (PROTONIX) 40 mg/20 mL oral suspension 40 mg  polyethylene glycol (MIRALAX / GLYCOLAX) packet 17 g  rosuvastatin (CRESTOR) tablet 20 mg   Technical Summary: A multichannel digital EEG recording measured by the international 10-20 system with electrodes applied with paste and impedances below 5000 ohms performed as portable with EKG monitoring in an awake and asleepno patient.  Hyperventilation and photic stimulation were not performed.  The digital EEG was referentially recorded, reformatted, and digitally filtered in a variety of bipolar and referential montages for optimal display.   Description: The patient is awake and asleep during the recording.  During maximal wakefulness, there is a symmetric, medium voltage 7 Hz posterior dominant rhythm that attenuates with eye opening. This is admixed with a moderate amount of diffuse 4-6 Hz theta slowing of the waking background.  During drowsiness and stage I sleep, there is an increase in theta slowing of the  background with poorly formed vertex waves seen. Hyperventilation and photic stimulation were not performed.  Eye movement artifact is seen without epileptiform correlate. There were no epileptiform discharges or electrographic seizures seen.    EKG lead was unremarkable.  Impression: This awake and asleep EEG is abnormal due to mild diffuse slowing of the waking background. Eye movement artifact is seen without epileptiform correlate.  Clinical Correlation of the above findings indicates diffuse cerebral dysfunction that is non-specific in etiology and can be seen with hypoxic/ischemic injury, toxic/metabolic encephalopathies, neurodegenerative disorders, or medication effect.  The absence of epileptiform discharges does not rule out a clinical diagnosis of epilepsy.  Clinical correlation is advised.   Ellouise Newer, M.D.

## 2016-10-21 NOTE — Progress Notes (Signed)
10/21/2016 12:43 PM  [place], [name] [phone]  Post-Op Day 1    Temp:  [97.8 F (36.6 C)-99.2 F (37.3 C)] 99.2 F (37.3 C) (11/07 1121) Pulse Rate:  [70-75] 74 (11/07 0600) Resp:  [15-25] 25 (11/07 0600) BP: (125-185)/(70-102) 141/82 (11/07 0600) SpO2:  [49 %-100 %] 100 % (11/07 0600) Weight:  [68.4 kg (150 lb 12.7 oz)] 68.4 kg (150 lb 12.7 oz) (11/07 0422),     Intake/Output Summary (Last 24 hours) at 10/21/16 1243 Last data filed at 10/21/16 1000  Gross per 24 hour  Intake             1110 ml  Output             1340 ml  Net             -230 ml    Results for orders placed or performed during the hospital encounter of 10/15/16 (from the past 24 hour(s))  Glucose, capillary     Status: Abnormal   Collection Time: 10/20/16  1:56 PM  Result Value Ref Range   Glucose-Capillary 164 (H) 65 - 99 mg/dL   Comment 1 Document in Chart   Glucose, capillary     Status: Abnormal   Collection Time: 10/20/16  3:07 PM  Result Value Ref Range   Glucose-Capillary 143 (H) 65 - 99 mg/dL   Comment 1 Document in Chart   Glucose, capillary     Status: Abnormal   Collection Time: 10/20/16  7:56 PM  Result Value Ref Range   Glucose-Capillary 188 (H) 65 - 99 mg/dL   Comment 1 Notify RN    Comment 2 Document in Chart   Glucose, capillary     Status: Abnormal   Collection Time: 10/20/16 11:50 PM  Result Value Ref Range   Glucose-Capillary 245 (H) 65 - 99 mg/dL   Comment 1 Notify RN    Comment 2 Document in Chart   Basic metabolic panel     Status: Abnormal   Collection Time: 10/21/16  2:09 AM  Result Value Ref Range   Sodium 147 (H) 135 - 145 mmol/L   Potassium 3.2 (L) 3.5 - 5.1 mmol/L   Chloride 103 101 - 111 mmol/L   CO2 31 22 - 32 mmol/L   Glucose, Bld 234 (H) 65 - 99 mg/dL   BUN 52 (H) 6 - 20 mg/dL   Creatinine, Ser 1.40 (H) 0.44 - 1.00 mg/dL   Calcium 10.4 (H) 8.9 - 10.3 mg/dL   GFR calc non Af Amer 37 (L) >60 mL/min   GFR calc Af Amer 42 (L) >60 mL/min   Anion gap  13 5 - 15  CBC     Status: Abnormal   Collection Time: 10/21/16  2:09 AM  Result Value Ref Range   WBC 8.3 4.0 - 10.5 K/uL   RBC 4.13 3.87 - 5.11 MIL/uL   Hemoglobin 10.6 (L) 12.0 - 15.0 g/dL   HCT 35.9 (L) 36.0 - 46.0 %   MCV 86.9 78.0 - 100.0 fL   MCH 25.7 (L) 26.0 - 34.0 pg   MCHC 29.5 (L) 30.0 - 36.0 g/dL   RDW 16.1 (H) 11.5 - 15.5 %   Platelets 393 150 - 400 K/uL  Magnesium     Status: None   Collection Time: 10/21/16  2:09 AM  Result Value Ref Range   Magnesium 2.2 1.7 - 2.4 mg/dL  Phosphorus     Status: None   Collection Time: 10/21/16  2:09 AM  Result Value Ref Range   Phosphorus 2.7 2.5 - 4.6 mg/dL  Glucose, capillary     Status: Abnormal   Collection Time: 10/21/16  3:50 AM  Result Value Ref Range   Glucose-Capillary 240 (H) 65 - 99 mg/dL   Comment 1 Notify RN    Comment 2 Document in Chart   Glucose, capillary     Status: Abnormal   Collection Time: 10/21/16  7:59 AM  Result Value Ref Range   Glucose-Capillary 227 (H) 65 - 99 mg/dL   Comment 1 Document in Chart   Troponin I (q 6hr x 3)     Status: Abnormal   Collection Time: 10/21/16  8:43 AM  Result Value Ref Range   Troponin I 1.09 (HH) <0.03 ng/mL  Glucose, capillary     Status: Abnormal   Collection Time: 10/21/16 11:19 AM  Result Value Ref Range   Glucose-Capillary 152 (H) 65 - 99 mg/dL   Comment 1 Document in Chart     SUBJECTIVE:  Breathing seems some easier.    OBJECTIVE:  Voice more phonatory but overall quite weak.  Sl insp stridor but no obvious labor or dyspnea  IMPRESSION:  Improved airway  PLAN:  Observe closely for progressive narrowing or fatigue.  [name]

## 2016-10-21 NOTE — Progress Notes (Signed)
PULMONARY / CRITICAL CARE MEDICINE   Name: [name] MRN: [id number] DOB: Oct 17, 1946    ADMISSION DATE:  10/15/2016  REFERRING MD:  Lita Mains - EDP.  CHIEF COMPLAINT:  Dyspnea, stridor   Brief:    70yo female with multiple medical problems including dCHF, GERD, hx ETOH, AFib, CKD, angioedema with chronic upper airway narrowing after an intubation with recent laryngoscopy by ENT with removal of granulation tissue 3 weeks prior to admit but has continued to have dyspnea and stridor.  She presented 11/1 to ER with ongoing dyspnea and stridor.  She initially improved with steroids, BD and ativan and was seen by ENT who scheduled direct laryngoscopy for 11/2 but wanted cardiology clearance first r/t troponin 0.57.  Over time she became increasingly stridorous, tripoding, diaphoretic and required intubation in ER. PCCM called to admit.   Patient was hospitalized in early September for CHF requiring intubation. October presented to hospital with stridor and dyspnea, worked up by ENT for intubation related granuloma posterior subglottic larynx. On that admission underwent direct laryngoscopy and removal of granuloma.   SIGNIFICANT EVENTS: 11/1 > admitted with stridor / respiratory distress requiring intubation. 11/2 > MICRODIRECTLARYNGOSCOPY WITH LASER ABLATION AND KENLOG INJECTION - failed extubaiton in OR and re-intubated 11/ 6 Extubated in OR, stridor after extubation ICU, complains of chest pain, notable confusion   SUBJECTIVE/OVERNIGHT/INTERVAL HX Extubated yesterday Stridor post extubation better post racemic epinephrine Chest pain overnight  VITAL SIGNS: BP (!) 141/82 (BP Location: Right Arm)   Pulse 74   Temp 98.4 F (36.9 C) (Oral)   Resp (!) 25   Ht 5\' 2"  (1.575 m)   Wt 68.4 kg (150 lb 12.7 oz)   SpO2 100%   BMI 27.58 kg/m   HEMODYNAMICS:    VENTILATOR SETTINGS: Vent Mode: PRVC FiO2 (%):  [40 %] 40 % Set Rate:  [12 bmp] 12 bmp Vt Set:  [330 mL] 330  mL PEEP:  [5 cmH20] 5 cmH20 Plateau Pressure:  [18 cmH20] 18 cmH20  INTAKE / OUTPUT: I/O last 3 completed shifts: In: 1540.3 [I.V.:740.3; NG/GT:800] Out: 2355 [Urine:2355]  PHYSICAL EXAMINATION: General: staring at ceiling HENT: NCAT, OP clear PULM: mild upper airway stridor, normal air movement and normal respiratory effort, speaking in full sentences CV: RRR, no mgr GI: Belly soft, nontender MSK: normal bulk and tone Neuro: myoclonus? Of eyelids, upward gaze, follows commands speech clear and coherent, oriented to situation, place   LABS:  PULMONARY  Recent Labs Lab 10/15/16 1331 10/15/16 1541 10/16/16 0308  PHART 7.431 7.624* 7.399  PCO2ART 52.4* 29.8* 51.7*  PO2ART 35.0* 141.0* 140*  HCO3 35.1* 31.0* 31.6*  TCO2 37 32  --   O2SAT 70.0 100.0 98.6    CBC  Recent Labs Lab 10/19/16 0315 10/20/16 0256 10/21/16 0209  HGB 10.0* 9.9* 10.6*  HCT 33.3* 33.4* 35.9*  WBC 8.0 9.1 8.3  PLT 278 357 393    COAGULATION No results for input(s): INR in the last 168 hours.  CARDIAC    Recent Labs Lab 10/15/16 1619 10/15/16 2121 10/16/16 0252  TROPONINI 0.93* 1.03* 1.00*   No results for input(s): PROBNP in the last 168 hours.   CHEMISTRY  Recent Labs Lab 10/17/16 0219 10/18/16 0224 10/19/16 0315 10/20/16 0256 10/21/16 0209  NA 143 145 146* 146* 147*  K 3.6 3.6 4.2 3.8 3.2*  CL 107 106 108 104 103  CO2 26 29 29 31 31   GLUCOSE 168* 130* 155* 171* 234*  BUN 25* 39* 49* 48* 52*  CREATININE 2.15* 2.17* 1.59* 1.37* 1.40*  CALCIUM 9.8 9.8 9.6 10.0 10.4*  MG 2.2 2.2 2.3 2.2 2.2  PHOS 4.4 4.2 3.3 1.8* 2.7   Estimated Creatinine Clearance: 32.9 mL/min (by C-G formula based on SCr of 1.4 mg/dL (H)).   LIVER  Recent Labs Lab 10/15/16 1040  AST 30  ALT 9*  ALKPHOS 54  BILITOT 0.5  PROT 7.3  ALBUMIN 3.4*     INFECTIOUS No results for input(s): LATICACIDVEN, PROCALCITON in the last 168 hours.   ENDOCRINE CBG (last 3)   Recent Labs   10/20/16 2350 10/21/16 0350 10/21/16 0759  GLUCAP 245* 240* 227*    11/7 EKG> paced rhythm  ASSESSMENT / PLAN:   PULMONARY A: Acute hypoxic respiratory failure - due to stridor from laryngeal stenosis following intubation s/p laryngoscopy with laser ablation > persistent mild stridor 11/7 Hx OSA, Former smoker  P:   ENT following - will need tracheostomy if requires re-intubation Albuterol PRN Decadron scheduled, wean 11/8   CARDIOVASCULAR A:  Troponin leak - consider demand - down-trending > recurrent chest pain 11/7, troponin reassuring GERD related? dCHF - RHC and LHC 08/19/16 with normal coronary arteries, severe diastolic dysfunction or significant MR, normal LV. Hx HCM, HTN, HLD, Afib (not on anticoagulation due to H/O GIB)   P:  Appreciate cardiology Tele monitoring Continue home Lasix and Crestor  Hydralazine 5mg  q6h > increase PRN Labetalol for Systolic >774  Add back PPI for chest pain  RENAL A:   CKD  P:   Monitor BMET and UOP Replace electrolytes as needed   GASTROINTESTINAL A:   GI prophylaxis Nutrition. s/p cortrak 10/17/16 P:   SUP: Protonix  TF per cortrak  HEMATOLOGIC A:   VTE Prophylaxis P:  SCD's  SQ Heparin   INFECTIOUS A:   Leukocytosis (current steroid regimen)/ improving   P:   Monitor WBC and Fever Curve   ENDOCRINE A:   Hyperglycemia - no hx DM P:   SSI q4H glucose checks   NEUROLOGIC A:   Acute encephalopathy with myoclonus eyelids and upward gaze 11/7, medication related? Seizure? Hx RLS, panic attacks, ETOH abuse (quit 2012), DDD P:   EEG Head CT Neurology consult   Family updated: son, daughter updated bedside 11/7   Interdisciplinary Family Meeting v Palliative Care Meeting:  Due by: 10/21/16.  My cc time 35 minutes   Roselie Awkward, MD [facility] Pager: [phone] Cell: [phone] After 3pm or if no response, call [phone]

## 2016-10-21 NOTE — Significant Event (Signed)
CRITICAL VALUE ALERT  Critical value received:  Troponin 1.09  Date of notification: 10/21/16  Time of notification:0848  Critical value read back: yes  Nurse who received alert:  Lenor Coffin, RN  MD notified: Dr Lake Bells   Time:  8916

## 2016-10-22 LAB — BASIC METABOLIC PANEL
ANION GAP: 9 (ref 5–15)
BUN: 72 mg/dL — AB (ref 6–20)
CHLORIDE: 111 mmol/L (ref 101–111)
CO2: 32 mmol/L (ref 22–32)
Calcium: 10.3 mg/dL (ref 8.9–10.3)
Creatinine, Ser: 1.51 mg/dL — ABNORMAL HIGH (ref 0.44–1.00)
GFR, EST AFRICAN AMERICAN: 39 mL/min — AB (ref >60)
GFR, EST NON AFRICAN AMERICAN: 33 mL/min — AB (ref >60)
Glucose, Bld: 203 mg/dL — ABNORMAL HIGH (ref 65–99)
POTASSIUM: 4.7 mmol/L (ref 3.5–5.1)
SODIUM: 152 mmol/L — AB (ref 135–145)

## 2016-10-22 LAB — CBC
HCT: 37.7 % (ref 36.0–46.0)
HEMOGLOBIN: 10.8 g/dL — AB (ref 12.0–15.0)
MCH: 25.9 pg — AB (ref 26.0–34.0)
MCHC: 28.6 g/dL — ABNORMAL LOW (ref 30.0–36.0)
MCV: 90.4 fL (ref 78.0–100.0)
PLATELETS: 365 10*3/uL (ref 150–400)
RBC: 4.17 MIL/uL (ref 3.87–5.11)
RDW: 16.4 % — ABNORMAL HIGH (ref 11.5–15.5)
WBC: 8.9 10*3/uL (ref 4.0–10.5)

## 2016-10-22 LAB — GLUCOSE, CAPILLARY
GLUCOSE-CAPILLARY: 217 mg/dL — AB (ref 65–99)
Glucose-Capillary: 124 mg/dL — ABNORMAL HIGH (ref 65–99)
Glucose-Capillary: 157 mg/dL — ABNORMAL HIGH (ref 65–99)
Glucose-Capillary: 178 mg/dL — ABNORMAL HIGH (ref 65–99)
Glucose-Capillary: 191 mg/dL — ABNORMAL HIGH (ref 65–99)
Glucose-Capillary: 202 mg/dL — ABNORMAL HIGH (ref 65–99)

## 2016-10-22 LAB — PHOSPHORUS: PHOSPHORUS: 3 mg/dL (ref 2.5–4.6)

## 2016-10-22 LAB — MAGNESIUM: MAGNESIUM: 2.4 mg/dL (ref 1.7–2.4)

## 2016-10-22 MED ORDER — DEXAMETHASONE SODIUM PHOSPHATE 4 MG/ML IJ SOLN
4.0000 mg | INTRAMUSCULAR | Status: DC
  Administered 2016-10-25: 4 mg via INTRAVENOUS
  Filled 2016-10-22: qty 1

## 2016-10-22 MED ORDER — DEXAMETHASONE SODIUM PHOSPHATE 4 MG/ML IJ SOLN: 4.0000 mg | INTRAMUSCULAR | Status: DC

## 2016-10-22 MED ORDER — DEXAMETHASONE SODIUM PHOSPHATE 4 MG/ML IJ SOLN
4.0000 mg | Freq: Two times a day (BID) | INTRAMUSCULAR | Status: AC
  Administered 2016-10-22 – 2016-10-24 (×4): 4 mg via INTRAVENOUS
  Filled 2016-10-22 (×6): qty 1

## 2016-10-22 MED ORDER — CARVEDILOL 6.25 MG PO TABS
6.2500 mg | ORAL_TABLET | Freq: Two times a day (BID) | ORAL | Status: DC
  Administered 2016-10-22 (×2): 6.25 mg via ORAL
  Filled 2016-10-22 (×4): qty 1

## 2016-10-22 MED ORDER — FREE WATER
200.0000 mL | Freq: Three times a day (TID) | Status: DC
  Administered 2016-10-22 – 2016-10-23 (×3): 200 mL

## 2016-10-22 NOTE — Progress Notes (Signed)
PULMONARY / CRITICAL CARE MEDICINE   Name: [name] MRN: [id number] DOB: Apr 18, 1946    ADMISSION DATE:  10/15/2016  REFERRING MD:  Lita Mains - EDP.  CHIEF COMPLAINT:  Dyspnea, stridor   Brief:    70yo female with multiple medical problems including dCHF, GERD, hx ETOH, AFib, CKD, angioedema with chronic upper airway narrowing after an intubation with recent laryngoscopy by ENT with removal of granulation tissue 3 weeks prior to admit but has continued to have dyspnea and stridor.  She presented 11/1 to ER with ongoing dyspnea and stridor.  She initially improved with steroids, BD and ativan and was seen by ENT who scheduled direct laryngoscopy for 11/2 but wanted cardiology clearance first r/t troponin 0.57.  Over time she became increasingly stridorous, tripoding, diaphoretic and required intubation in ER. PCCM called to admit.   Patient was hospitalized in early September for CHF requiring intubation. October presented to hospital with stridor and dyspnea, worked up by ENT for intubation related granuloma posterior subglottic larynx. On that admission underwent direct laryngoscopy and removal of granuloma.   SIGNIFICANT EVENTS: 10/05: Direct Laryngoscopy with Biopsy and Bronchoscopy for granulation tissue in the posterior subglottis bridging from side to side. 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 THERE IS NO EVIDENCE OF MALIGNANCY. 11/1 > admitted with stridor / respiratory distress requiring intubation. 11/2 > MICRODIRECTLARYNGOSCOPY WITH LASER ABLATION AND KENLOG INJECTION - failed extubaiton in OR and re-intubated 11/ 6 Extubated in OR, stridor after extubation ICU, complains of chest pain, notable confusion 11/7: chest pain. Trop 1.09>1.06>1.0. Card not impressed. unexplained encephalopathy neuro on board. EEG not suggestive for epilepsy. CT negative for acute process  SUBJECTIVE/OVERNIGHT/INTERVAL HX Stable over night. She spend the night on 2L by  [place]. She continues to complain chest pain but couldn't characterize it.   VITAL SIGNS: BP (!) 149/89 (BP Location: Right Arm)   Pulse 74   Temp 97.6 F (36.4 C) (Oral)   Resp (!) 24   Ht 5\' 2"  (1.575 m)   Wt 67.4 kg (148 lb 9.4 oz)   SpO2 100%   BMI 27.18 kg/m   HEMODYNAMICS:  Not on pressors  VENTILATOR SETTINGS:  Extubated 11/6  INTAKE / OUTPUT: I/O last 3 completed shifts: In: 1592.1 [I.V.:592.1; NG/GT:1000] Out: 2175 [Urine:2175]  PHYSICAL EXAMINATION: General: appears stable, NGT and  in place HENT: NCAT, OP clear PULM: mild upper airway sound, normal air movement and normal respiratory effort, speaking in full sentences CV: RRR, no mgr GI: Belly soft, nontender MSK: normal bulk and tone Neuro: awake, oriented to self, place and situation but not time. Follows commands speech clear and coherent. Pupil round and mildly reactive to light. EOMI intact. Motor intact.   LABS:  PULMONARY  Recent Labs Lab 10/15/16 1331 10/15/16 1541 10/16/16 0308  PHART 7.431 7.624* 7.399  PCO2ART 52.4* 29.8* 51.7*  PO2ART 35.0* 141.0* 140*  HCO3 35.1* 31.0* 31.6*  TCO2 37 32  --   O2SAT 70.0 100.0 98.6    CBC  Recent Labs Lab 10/20/16 0256 10/21/16 0209 10/22/16 0205  HGB 9.9* 10.6* 10.8*  HCT 33.4* 35.9* 37.7  WBC 9.1 8.3 8.9  PLT 357 393 365    COAGULATION No results for input(s): INR in the last 168 hours.  CARDIAC    Recent Labs Lab 10/15/16 2121 10/16/16 0252 10/21/16 0843 10/21/16 1317 10/21/16 1602  TROPONINI 1.03* 1.00* 1.09* 1.06* 1.00*   No results for input(s): PROBNP in the last 168 hours.   CHEMISTRY  Recent  Labs Lab 10/18/16 0224 10/19/16 0315 10/20/16 0256 10/21/16 0209 10/22/16 0205  NA 145 146* 146* 147* 152*  K 3.6 4.2 3.8 3.2* 4.7  CL 106 108 104 103 111  CO2 29 29 31 31  32  GLUCOSE 130* 155* 171* 234* 203*  BUN 39* 49* 48* 52* 72*  CREATININE 2.17* 1.59* 1.37* 1.40* 1.51*  CALCIUM 9.8 9.6 10.0 10.4* 10.3  MG 2.2 2.3  2.2 2.2 2.4  PHOS 4.2 3.3 1.8* 2.7 3.0   Estimated Creatinine Clearance: 30.3 mL/min (by C-G formula based on SCr of 1.51 mg/dL (H)).   LIVER  Recent Labs Lab 10/15/16 1040  AST 30  ALT 9*  ALKPHOS 54  BILITOT 0.5  PROT 7.3  ALBUMIN 3.4*     INFECTIOUS No results for input(s): LATICACIDVEN, PROCALCITON in the last 168 hours.   ENDOCRINE CBG (last 3)   Recent Labs  10/21/16 1643 10/21/16 1944 10/21/16 2329  GLUCAP 167* 232* 198*    11/7 EKG> paced rhythm  ASSESSMENT / PLAN:  PULM A: Acute hypoxic respiratory failure - due to stridor from laryngeal stenosis following intubation s/p laryngoscopy with laser ablation > persistent mild stridor 11/7 Hx OSA, Former smoker  P:   ENT following - will need tracheostomy if requires re-intubation Albuterol PRN Decadron 4mg  q6h 11/1>11/8 Decadron 4mg  q12h 11/8>11/10, then wean as needed  CARDIOVASCULAR A:  Recurrent chest pain 11/7. Troponin leak - demand - down-trending dCHF - RHC and LHC 08/19/16 with normal coronary arteries, severe diastolic dysfunction or significant MR, normal LV. Hx HCM, HTN, HLD, CHB, Afib (CHAD-VAsc 4. Not on anticoagulation due to H/O GIB)   P:  Appreciate cardiology Tele monitoring Consider holding home Lasix for elevated BUN Continue home crestor  Hydralazine 5mg  q6h > 10-40 mg q4h as needed SBP>160 PRN Labetalol for Systolic >585  Add back PPI for chest pain  RENAL A:   CKD b/l Cr 1.3-1.5 sCr down-trending and 1.5 this morning BUN uptrended to 72 likely due to lasix. Hgb stable HyperNa to 152  P:   Free water at 200cc/Kg Monitor BMET and UOP Replace electrolytes as needed Consider holding home lasix   GASTROINTESTINAL A:   GI prophylaxis Nutrition. s/p cortrak 10/17/16 P:   SUP: Protonix  TF per cortrak  HEMATOLOGIC A:   VTE Prophylaxis P:  SCD's  SQ Heparin   INFECTIOUS A:   Leukocytosis (current steroid regimen)/ improving   P:   Monitor WBC and Fever  Curve   ENDOCRINE A:   Hyperglycemia - no hx DM P:   SSI q4H glucose checks   NEUROLOGIC A:   Acute encephalopathy with myoclonus eyelids and upward gaze 11/7, medication related? Seizure? Hx RLS, panic attacks, ETOH abuse (quit 2012), DDD EEG with diffuse cerebral dysfunction, nonspecific for seizure Head CT with mild frontal and parietal lobe atrophy bilaterally. No acute IC process  P:   Neurology following ?MRI  Family updated: son, daughter updated bedside 11/7. No family at bedside today  Interdisciplinary Family Meeting v Palliative Care Meeting:  Due by: 10/21/16.  Wendee Beavers, MD.  10/22/16 6:29 AM PGY-2, [place]   Attending:  I have seen and examined the patient with nurse practitioner/resident and agree with the note above.  We formulated the plan together and I elicited the following history.    More awake today, answering questions, following commands  On exam: Minimal stridor, normal air movement Belly soft, nontender CV: RRR, no mgr  Acute encephalopathy> unclear etiology, seems  to be slowly improving, medication related?  Airway narrowing, stridor> continue GERD treatment, decadron bid for 2 more days, then daily, complete treatment at 7 days from now; swallow evaluation Chest pain > troponin negative, monitor tele, continue GERD treatment Hold lasix today given rise in Cr PT consult, SLP eval  Move to SDU today, TRH  Roselie Awkward, MD [facility] Pager: [phone] Cell: [phone] After 3pm or if no response, call [phone]

## 2016-10-22 NOTE — Evaluation (Signed)
Clinical/Bedside Swallow Evaluation Patient Details  Name: [name] MRN: [id number] Date of Birth: 1946/12/07  Today's Date: 10/22/2016 Time: SLP Start Time (ACUTE ONLY): 1155 SLP Stop Time (ACUTE ONLY): 1210 SLP Time Calculation (min) (ACUTE ONLY): 15 min  Past Medical History:  Past Medical History:  Diagnosis Date  . Anemia   . Angina   . Angioedema    2/2 ACE  . Arteriovenous malformation of stomach   . Arthritis   . Asthma   . AVM (arteriovenous malformation) of colon    small intestine; stomach  . Blood transfusion   . CHF (congestive heart failure) ([place])   . Complete heart block ([place])    s/p PPM 1998  . Complication of anesthesia    Difficult airway; anaphylaxis and swelling with propofol  . DDD (degenerative disc disease)   . Depression   . Diastolic heart failure   . Fatty liver 07/26/10  . GERD (gastroesophageal reflux disease)   . GI bleed   . Headache   . History of alcohol abuse Stopped Fall 2012  . History of tobacco use Quit Fall 2012  . Hx of cardiovascular stress test    a. Lexiscan Myoview (10/15):  Small inferolateral and apical defect c/w scar and poss soft tissue attenuation, no ischemia, EF 42%  . Hx of colonic polyp 08/13/10   adenomatous  . Hx of colonoscopy   . Hyperlipidemia   . Hyperlipidemia   . Hypertension   . Hypertrophic cardiomyopathy ([place])    dx by Dr [name] 2009  . Iron deficiency anemia   . Panic attack   . Panic attacks   . Permanent atrial fibrillation ([place])   . Pneumonia   . Renal failure    baseline creatinine 1.6  . Right arm pain 01/08/2012  . RLS (restless legs syndrome)    Dx 06/2007  . Shortness of breath    sob on exertation  . Sleep apnea    Past Surgical History:  Past Surgical History:  Procedure Laterality Date  . CARDIAC CATHETERIZATION    . CARDIAC CATHETERIZATION N/A 08/19/2016   Procedure: Right/Left Heart Cath and Coronary Angiography;  Surgeon: Jolaine Artist, MD;  Location: [facility];  Service: Cardiovascular;  Laterality: N/A;  . DIRECT LARYNGOSCOPY N/A 09/18/2016   Procedure: DIRECT LARYNGOSCOPY, BRONCHOSCOPY, REMOVAL OF INTUBATION GRANULOMA;  Surgeon: [name], MD;  Location[facility] OR;  Service: ENT;  Laterality: N/A;  . DIRECT LARYNGOSCOPY N/A 10/20/2016   Procedure: EXTUBATION AND FLEXIBLE LARYNGOSCOPE;  Surgeon: [name], MD;  Location: [facility];  Service: ENT;  Laterality: N/A;  . ESOPHAGOGASTRODUODENOSCOPY  12/23/2011   Procedure: ESOPHAGOGASTRODUODENOSCOPY (EGD);  Surgeon: [name], MD;  Location: [facility];  Service: Endoscopy;  Laterality: N/A;  . EXTUBATION (ENDOTRACHEAL) IN OR N/A 07/21/2016   Procedure: EXTUBATION (ENDOTRACHEAL) IN OR;  Surgeon: [name], MD;  Location: [facility];  Service: ENT;  Laterality: N/A;  . GIVENS CAPSULE STUDY  12/23/2011   Procedure: GIVENS CAPSULE STUDY;  Surgeon: [name], MD;  Location: [facility];  Service: Endoscopy;  Laterality: N/A;  . MICROLARYNGOSCOPY WITH LASER N/A 10/16/2016   Procedure: MICRODIRECTLARYNGOSCOPY WITH LASER ABLATION AND KENLOG INJECTION;  Surgeon: [name], MD;  Location: [place];  Service: ENT;  Laterality: N/A;  . PACEMAKER INSERTION  1998   st jude, most recent gen change by [name] 4/12  . TUBAL LIGATION  04/01/2000   HPI:  70 yo female with multiple medical problems including dCHF, GERD, hx  ETOH, AFib, CKD, angioedema with chronic upper airway narrowing after an intubation with recent laryngoscopy by ENT with removal of granulation tissue 3 weeks prior to admit but has continued to have dyspnea and stridor. She presented 11/1 to ER with ongoing dyspnea and stridor. She initially improved with steroids, BD and ativan and was seen by ENT who scheduled direct laryngoscopy for 11/2 but wanted cardiology clearance first r/t troponin 0.57. Over time she became increasingly stridorous, tripoding, diaphoretic and required intubation in ER. PCCM called to admit. Pt was intubated 11/2 and extubated  11/6.   Assessment / Plan / Recommendation Clinical Impression  Pt seen post prolonged intubation. She had a congested cough, but was able to cough up secretions. Oral motor exam WFL. She consumed minimal trials of ice chips and immediate throat clearing/coughing and delayed coughing was observed. Recommend pt remain NPO. Will f/u for PO readiness with likely instrumental testing needed prior to diet initiation.     Aspiration Risk  Moderate aspiration risk    Diet Recommendation NPO   Medication Administration: Via alternative means    Other  Recommendations Oral Care Recommendations: Oral care QID   Follow up Recommendations Other (comment) (tba)      Frequency and Duration min 2x/week  2 weeks       Prognosis Prognosis for Safe Diet Advancement: Good Barriers to Reach Goals: Cognitive deficits      Swallow Study   General HPI: 70 yo female with multiple medical problems including dCHF, GERD, hx ETOH, AFib, CKD, angioedema with chronic upper airway narrowing after an intubation with recent laryngoscopy by ENT with removal of granulation tissue 3 weeks prior to admit but has continued to have dyspnea and stridor. She presented 11/1 to ER with ongoing dyspnea and stridor. She initially improved with steroids, BD and ativan and was seen by ENT who scheduled direct laryngoscopy for 11/2 but wanted cardiology clearance first r/t troponin 0.57. Over time she became increasingly stridorous, tripoding, diaphoretic and required intubation in ER. PCCM called to admit. Pt was intubated 11/2 and extubated 11/6. Type of Study: Bedside Swallow Evaluation Previous Swallow Assessment: none in chart Diet Prior to this Study: NPO Temperature Spikes Noted: No Respiratory Status: Nasal cannula History of Recent Intubation: Yes Length of Intubations (days): 4 days Date extubated: 10/20/16 Behavior/Cognition: Alert;Cooperative;Requires cueing Oral Cavity Assessment: Within Functional  Limits Oral Care Completed by SLP: No Oral Cavity - Dentition: Poor condition Self-Feeding Abilities: Total assist Patient Positioning: Upright in bed Baseline Vocal Quality: Low vocal intensity Volitional Cough: Congested Volitional Swallow: Able to elicit    Oral/Motor/Sensory Function Overall Oral Motor/Sensory Function: Within functional limits   Ice Chips Ice chips: Impaired Presentation: Spoon Pharyngeal Phase Impairments: Throat Clearing - Immediate;Cough - Delayed;Cough - Immediate   Thin Liquid Thin Liquid: Not tested    Nectar Thick Nectar Thick Liquid: Not tested   Honey Thick Honey Thick Liquid: Not tested   Puree Puree: Not tested   Solid   GO   Solid: Not tested       Ezekiel Slocumb, Student SLP  Shela Leff 10/22/2016,2:20 PM

## 2016-10-22 NOTE — Progress Notes (Signed)
10/22/2016 9:19 AM  [name], [name] [id number]  Post-Op Day 2    Temp:  [97.4 F (36.3 C)-99.2 F (37.3 C)] 98.1 F (36.7 C) (11/08 0733) Pulse Rate:  [74-75] 74 (11/08 0800) Resp:  [20-29] 25 (11/08 0800) BP: (138-201)/(75-115) 180/96 (11/08 0804) SpO2:  [100 %] 100 % (11/08 0800) Weight:  [67.4 kg (148 lb 9.4 oz)] 67.4 kg (148 lb 9.4 oz) (11/08 0358),     Intake/Output Summary (Last 24 hours) at 10/22/16 0919 Last data filed at 10/22/16 0600  Gross per 24 hour  Intake              990 ml  Output             1150 ml  Net             -160 ml    Results for orders placed or performed during the hospital encounter of 10/15/16 (from the past 24 hour(s))  Glucose, capillary     Status: Abnormal   Collection Time: 10/21/16 11:19 AM  Result Value Ref Range   Glucose-Capillary 152 (H) 65 - 99 mg/dL   Comment 1 Document in Chart   Troponin I (q 6hr x 3)     Status: Abnormal   Collection Time: 10/21/16  1:17 PM  Result Value Ref Range   Troponin I 1.06 (HH) <0.03 ng/mL  Troponin I (q 6hr x 3)     Status: Abnormal   Collection Time: 10/21/16  4:02 PM  Result Value Ref Range   Troponin I 1.00 (HH) <0.03 ng/mL  Glucose, capillary     Status: Abnormal   Collection Time: 10/21/16  4:43 PM  Result Value Ref Range   Glucose-Capillary 167 (H) 65 - 99 mg/dL   Comment 1 Notify RN   Glucose, capillary     Status: Abnormal   Collection Time: 10/21/16  7:44 PM  Result Value Ref Range   Glucose-Capillary 232 (H) 65 - 99 mg/dL   Comment 1 Notify RN    Comment 2 Document in Chart   Glucose, capillary     Status: Abnormal   Collection Time: 10/21/16 11:29 PM  Result Value Ref Range   Glucose-Capillary 198 (H) 65 - 99 mg/dL   Comment 1 Notify RN    Comment 2 Document in Chart   CBC     Status: Abnormal   Collection Time: 10/22/16  2:05 AM  Result Value Ref Range   WBC 8.9 4.0 - 10.5 K/uL   RBC 4.17 3.87 - 5.11 MIL/uL   Hemoglobin 10.8 (L) 12.0 - 15.0 g/dL   HCT 37.7 36.0 -  46.0 %   MCV 90.4 78.0 - 100.0 fL   MCH 25.9 (L) 26.0 - 34.0 pg   MCHC 28.6 (L) 30.0 - 36.0 g/dL   RDW 16.4 (H) 11.5 - 15.5 %   Platelets 365 150 - 400 K/uL  Basic metabolic panel     Status: Abnormal   Collection Time: 10/22/16  2:05 AM  Result Value Ref Range   Sodium 152 (H) 135 - 145 mmol/L   Potassium 4.7 3.5 - 5.1 mmol/L   Chloride 111 101 - 111 mmol/L   CO2 32 22 - 32 mmol/L   Glucose, Bld 203 (H) 65 - 99 mg/dL   BUN 72 (H) 6 - 20 mg/dL   Creatinine, Ser 1.51 (H) 0.44 - 1.00 mg/dL   Calcium 10.3 8.9 - 10.3 mg/dL   GFR calc non Af Amer 33 (L) >60 mL/min  GFR calc Af Amer 39 (L) >60 mL/min   Anion gap 9 5 - 15  Magnesium     Status: None   Collection Time: 10/22/16  2:05 AM  Result Value Ref Range   Magnesium 2.4 1.7 - 2.4 mg/dL  Phosphorus     Status: None   Collection Time: 10/22/16  2:05 AM  Result Value Ref Range   Phosphorus 3.0 2.5 - 4.6 mg/dL  Glucose, capillary     Status: Abnormal   Collection Time: 10/22/16  3:47 AM  Result Value Ref Range   Glucose-Capillary 178 (H) 65 - 99 mg/dL   Comment 1 Notify RN    Comment 2 Document in Chart   Glucose, capillary     Status: Abnormal   Collection Time: 10/22/16  7:30 AM  Result Value Ref Range   Glucose-Capillary 202 (H) 65 - 99 mg/dL   Comment 1 Notify RN    Comment 2 Document in Chart     SUBJECTIVE:  No throat pain.  Feels breathing somewhat easier.  OBJECTIVE:  Voice more phonatory.  Some insp stridor but no apparent labor  IMPRESSION:  Improving slowly.  PLAN:  Will see if respirations are limiting to activity when she gets stronger.  Continue aggressive anti-reflux management.    [name]

## 2016-10-23 LAB — BASIC METABOLIC PANEL
Anion gap: 10 (ref 5–15)
BUN: 73 mg/dL — AB (ref 6–20)
CALCIUM: 10.3 mg/dL (ref 8.9–10.3)
CHLORIDE: 109 mmol/L (ref 101–111)
CO2: 33 mmol/L — AB (ref 22–32)
CREATININE: 1.37 mg/dL — AB (ref 0.44–1.00)
GFR calc Af Amer: 43 mL/min — ABNORMAL LOW (ref >60)
GFR calc non Af Amer: 38 mL/min — ABNORMAL LOW (ref >60)
Glucose, Bld: 180 mg/dL — ABNORMAL HIGH (ref 65–99)
Potassium: 3.9 mmol/L (ref 3.5–5.1)
SODIUM: 152 mmol/L — AB (ref 135–145)

## 2016-10-23 LAB — GLUCOSE, CAPILLARY
GLUCOSE-CAPILLARY: 136 mg/dL — AB (ref 65–99)
GLUCOSE-CAPILLARY: 131 mg/dL — AB (ref 65–99)
GLUCOSE-CAPILLARY: 141 mg/dL — AB (ref 65–99)
Glucose-Capillary: 158 mg/dL — ABNORMAL HIGH (ref 65–99)
Glucose-Capillary: 207 mg/dL — ABNORMAL HIGH (ref 65–99)

## 2016-10-23 LAB — MAGNESIUM: MAGNESIUM: 2.4 mg/dL (ref 1.7–2.4)

## 2016-10-23 MED ORDER — INSULIN ASPART 100 UNIT/ML ~~LOC~~ SOLN
0.0000 [IU] | Freq: Three times a day (TID) | SUBCUTANEOUS | Status: DC
  Administered 2016-10-24 (×2): 1 [IU] via SUBCUTANEOUS
  Administered 2016-10-24: 9 [IU] via SUBCUTANEOUS
  Administered 2016-10-25: 2 [IU] via SUBCUTANEOUS
  Administered 2016-10-25 (×2): 1 [IU] via SUBCUTANEOUS

## 2016-10-23 MED ORDER — METOPROLOL TARTRATE 5 MG/5ML IV SOLN
2.5000 mg | Freq: Four times a day (QID) | INTRAVENOUS | Status: DC
  Administered 2016-10-23 – 2016-10-31 (×27): 2.5 mg via INTRAVENOUS
  Filled 2016-10-23 (×30): qty 5

## 2016-10-23 MED ORDER — PANTOPRAZOLE SODIUM 40 MG PO PACK
40.0000 mg | PACK | Freq: Every day | ORAL | Status: DC
Start: 2016-10-23 — End: 2016-10-23

## 2016-10-23 MED ORDER — FAMOTIDINE IN NACL 20-0.9 MG/50ML-% IV SOLN
20.0000 mg | INTRAVENOUS | Status: DC
  Administered 2016-10-23 – 2016-10-25 (×3): 20 mg via INTRAVENOUS
  Filled 2016-10-23 (×3): qty 50

## 2016-10-23 MED ORDER — INSULIN ASPART 100 UNIT/ML ~~LOC~~ SOLN
0.0000 [IU] | Freq: Every day | SUBCUTANEOUS | Status: DC
  Administered 2016-10-23: 2 [IU] via SUBCUTANEOUS
  Administered 2016-10-25: 1 [IU] via SUBCUTANEOUS

## 2016-10-23 MED ORDER — WHITE PETROLATUM GEL
Status: AC
  Administered 2016-10-23: 1
  Filled 2016-10-23: qty 1

## 2016-10-23 MED ORDER — DEXTROSE-NACL 5-0.45 % IV SOLN
INTRAVENOUS | Status: DC
  Administered 2016-10-23: 18:00:00 via INTRAVENOUS

## 2016-10-23 NOTE — Progress Notes (Signed)
Subjective: MS continues to improve.   Exam: Vitals:   10/23/16 0734 10/23/16 0800  BP:  128/73  Pulse:  74  Resp:  (!) 24  Temp: 97.9 F (36.6 C)    Gen: In bed, NAD Resp: non-labored breathing, no acute distress Abd: soft, nt  Neuro: MS: awake, alert, oriented to place, but not month or year.  CN:VFF, EOMI, slight asymmetric smile(lower on right), but within range orf normal, equal NL folds.  Motor: MAEW Sensory:intact to LT  Pertinent Labs: Borderline creatinine  Impression: 70 yo F with ICU delirium that appears to be improving. The eye movements that she had earlier are unusual but with their abrupt cessation with awakening and absence of changes on EEG I think they are benign, possibly an abnormal bell's phenomenon.   Recommendations: 1) Continue medical management per primary team.  2) No further neurodiagnostic studies at this time.  3) Neurology to sign off, please call with further questions or concerns.   Roland Rack, MD [facility] [phone]  If 7pm- 7am, please page neurology on call as listed in [place].

## 2016-10-23 NOTE — Care Management Note (Signed)
Case Management Note  Patient Details  Name: [name] MRN: [id number] Date of Birth: 07-13-1946  Subjective/Objective:     Pt admitted with worsening dyspnea and stridor - hc of laryngeal stenosis          Action/Plan:   PTA pt from home with adult son.  Pt ventilated during assessment - daughter and son at bedside.  Per son pt was independent prior to admit - recently discharged from [facility].  Pt would benefit from [facility] for disease management.    Expected Discharge Date:                  Expected Discharge Plan:     In-House Referral:     Discharge planning Services  CM Consult  Post Acute Care Choice:    Choice offered to:     DME Arranged:    DME Agency:     HH Arranged:    HH Agency:     Status of Service:  In process, will continue to follow  If discussed at Long Length of Stay Meetings, dates discussed:    Additional Comments: 10/23/2016  Discussed in LOS 10/23/16; pt remains appropriate for continued stay.  Pt is on [place] however continues to have persistant mild stridor.  Per neuro; mental status continues to improve, pt failed swallow test so still on tube feeds via ng tube in addition to IV decadron  10/20/16 Pt extubated today.  Family at bedside Maryclare Labrador, RN 10/23/2016, 9:08 AM

## 2016-10-23 NOTE — Progress Notes (Addendum)
Family Medicine Teaching Service Daily Progress Note Intern Pager: [phone]  Patient name: [name] Medical record number: [id number] Date of birth: 1946/03/30 Age: 70 y.o. Gender: female  Primary Care Provider: Steve Rattler, DO Consultants: CCM Code Status: FULL  Pt Overview and Major Events to Date:  10/5 direct laryngoscopy with biopsy and bronchoscopy for granulation tissue 11/1 Admit to CCM, intubated 11/2 ablation of subepiglottic granulation tissue, failed extubation in OR, re-intubated 11/6 extubated in OR   Assessment and Plan:  70 yo female with multiple medical problems including dCHF, GERD, hx ETOH, AFib, CKD, angioedema with chronic upper airway narrowing after an intubation with recent laryngoscopy by ENT with removal of granulation tissue 3 weeks prior to admit but has continued to have dyspnea and stridor.     #Acute hypoxic respiratory failure 2/2 to stridor from laryngeal stenosis.  Improving.  S/p ablation of subepiglottic granulation tissue 11/2.   -on 1L O2 post extubation   - wean to RA -ENT following, appreciate recs  -NG tube came out spontaneously.  SLP deemed patient unable to swallow independently on 11/8.  SLP will re-evaluate again today. Follow up recs.  -Consider PEG tube if unable to tolerate NG tube feedings  -For now, will switch all po meds to IV (IV pepcid instead of protonix) -Decadron 4mg  q12h 11/8 - 11/10, then 4mg  q24h from 11/11-11/14  -Albuterol prn  -PT -OT -monitor respiratory status  #Recurrent chest pain 11/7. Resolved.  Troponins downtrending 11/1-11/7.  Right heart cath and left heart cath on 08/19/2016 with normal coronary arteries, severe diastolic dysfunction, normal LV.   -monitor on tele -cards following -Aspirin 81mg  daily -Nitroglycerin prn -Telemetry  #Afib, rate controlled. CHAD-VAsc score of 4.  Patient not on anticoagulation given history of GI bleeding. Pacemaker in place. -cardiology recs -Continue  Aspirin 81 mg daily -monitor on tele  #Hypernatremia.  On admission Na+ 140.  This AM 152.   -Monitor BMET and urine output  -Replace electrolytes as needed  #Encephalopathy.  Resolved.  EEG normal.  Head CT with mild frontal and parietal lobe atrophy bilaterally.  No acute intracranial process.  -continue to monitor mental status  #CKD III.  Creatinine this AM 1.37, improved from 1.5 yesterday.  Baseline ~1.3.  BUN uptrended to 73 likely due to Lasix -Hold Lasix for now and see if BUN improves  -AM BMET -Avoid nephrotoxic meds  #Leukocytosis.  Resolved, WBC 8.9. Patient has been afebrile.   -Daily CBCs  #Hypertension. Stable. Only on Lasix 20mg  at home. BP 142/73 this morning. -Coreg 6.25mg  bid started this admission .  Will convert to IV if she needs a tube placed.  -Hydralazine prn (d/c'd Labetalol prn)   #COPD, moderate. Albuterol prn at home. -Albuterol neb q6hrs prn  #HLD.  - Continue home Crestor 20mg  daily  #CHF. On Lasix 20mg  at home. -Coreg 6.25mg  bid started this admission -Daily weights  #Type 2 Diabetes, new diagnosis: A1c 6.9% on 11/1. Not on any diabetes meds at home. -CBGs every 4 hours -Sensitive SSI  FEN/GI: NG tube  PPx: IV pepcid, SCDs, Heparin   Disposition: Dispo pending clinical improvement.   Subjective:  Patient stable overnight.  Is on 1 L O2.  Satting at 100%.   Does not report any issues at this time.  States she is not able to swallow on her own as of yet.    Objective: Temp:  [97.2 F (36.2 C)-98.1 F (36.7 C)] 98 F (36.7 C) (11/09 1122) Pulse Rate:  [69-75]  74 (11/09 1300) Resp:  [14-29] 19 (11/09 1300) BP: (128-171)/(73-131) 156/109 (11/09 1300) SpO2:  [98 %-100 %] 100 % (11/09 1300) Weight:  [146 lb 2.6 oz (66.3 kg)] 146 lb 2.6 oz (66.3 kg) (11/09 0427) Physical Exam: General: 70 yo F in hospital bed, NGT and [place] in place HENT: NCAT, oropharynx clear PULM: mild upper airway sound, normal air movement and normal respiratory  effort CV: RRR, no MRG GI: Belly soft, nontender, ND, +bs MSK: normal bulk and tone, no edema or tenderness  Neuro: AAOx3,  Follows commands speech clear and coherent. EOMI intact.  Laboratory:  Recent Labs Lab 10/20/16 0256 10/21/16 0209 10/22/16 0205  WBC 9.1 8.3 8.9  HGB 9.9* 10.6* 10.8*  HCT 33.4* 35.9* 37.7  PLT 357 393 365    Recent Labs Lab 10/21/16 0209 10/22/16 0205 10/23/16 0214  NA 147* 152* 152*  K 3.2* 4.7 3.9  CL 103 111 109  CO2 31 32 33*  BUN 52* 72* 73*  CREATININE 1.40* 1.51* 1.37*  CALCIUM 10.4* 10.3 10.3  GLUCOSE 234* 203* 180*   Imaging/Diagnostic Tests:  Ct Head Wo Contrast  Result Date: 10/21/2016 CLINICAL DATA:  Seizure.  Nonresponsiveness EXAM: CT HEAD WITHOUT CONTRAST TECHNIQUE: Contiguous axial images were obtained from the base of the skull through the vertex without intravenous contrast. COMPARISON:  July 15, 2016 FINDINGS: Brain: The ventricles are normal in size and configuration. There is mild frontal and parietal lobe atrophy bilaterally. There is no intracranial mass, hemorrhage, extra-axial fluid collection, or midline shift gray-white compartments appear unremarkable. No acute infarct evident. Vascular: No hyperdense vessel evident. There is calcification in both carotid siphon regions. There is also calcification in the distal left vertebral artery. Skull: The bony calvarium appears intact. Sinuses/Orbits: There is extensive opacification with retention cysts and polyps in both maxillary antra. There is mild mucosal thickening in multiple ethmoid air cells bilaterally. Orbits appear symmetric bilaterally. Other: Mastoid air cells are clear. IMPRESSION: Mild frontal and parietal lobe atrophy bilaterally. No intracranial mass hemorrhage, or extra-axial fluid collection. No focal gray - white compartment lesions. There is calcification in both carotid siphon regions. There is extensive opacity in both maxillary antra with much milder mucosal  thickening in several ethmoid air cells bilaterally. Electronically Signed   By: Lowella Grip III M.D.   On: 10/21/2016 10:27   Dg Chest Port 1 View  Result Date: 10/21/2016 CLINICAL DATA:  Stridor and dyspnea EXAM: PORTABLE CHEST 1 VIEW COMPARISON:  October 17, 2016 FINDINGS: Enteric tube tip is in the stomach. Pacemaker leads are attached to the right atrium and right ventricle. No pneumothorax. There is atelectatic change in the left lower lobe. Lungs elsewhere are clear. Heart is mildly enlarged with pulmonary vascularity within normal limits. No adenopathy. There is atherosclerotic calcification in the aorta. IMPRESSION: Enteric tube tip in stomach. Atelectasis left base. No edema or consolidation. Stable cardiac silhouette. There is aortic atherosclerosis. Electronically Signed   By: Lowella Grip III M.D.   On: 10/21/2016 10:22   Dg Abd Portable 1v  Result Date: 10/21/2016 CLINICAL DATA:  Feeding tube placement EXAM: PORTABLE ABDOMEN - 1 VIEW COMPARISON:  10/17/2016 FINDINGS: There is motion artifact which limits this examination. The feeding tube tip appears folded back upon itself within the distal stomach. Additional a esophageal tube tip projects over the left sub diaphragm. Nonobstructed bowel gas pattern. Calcified phleboliths in the pelvis. IMPRESSION: Feeding tube is folded back upon itself distally over the distal stomach. Electronically Signed  By: Donavan Foil M.D.   On: 10/21/2016 16:54   Lovenia Kim, MD 10/23/2016, 1:09 PM PGY-1, [place] Intern pager: [phone], text pages welcome   ------------------------------------------------------------------------------------  FMTS Attending Daily Note:  Chrisandra Netters MD Personal pager:  [phone] FPTS Service Pager:  [phone]  I have seen and examined this patient and have reviewed their chart. I have discussed this patient with the resident. I agree with the resident's findings, assessment and  care plan.  Additionally:  Mild stridor on exam this afternoon but conversant. Per SLP eval, cannot take PO at this time. Plan is to replace her feeding tube. In the meantime, give her gentle fluids since she has no intake & is hypernatremic. Still mildly confused - discussed concept of ICU delirium with son.  Leeanne Rio, MD 10/23/2016

## 2016-10-23 NOTE — Care Management Note (Signed)
Case Management Note  Patient Details  Name: [name] MRN: [id number] Date of Birth: 04-10-1946  Subjective/Objective:     Pt admitted with worsening dyspnea and stridor - hc of laryngeal stenosis          Action/Plan:   PTA pt from home with adult son.  Pt ventilated during assessment - daughter and son at bedside.  Per son pt was independent prior to admit - recently discharged from [facility].  Pt would benefit from [facility] for disease management.    Expected Discharge Date:                  Expected Discharge Plan:     In-House Referral:     Discharge planning Services  CM Consult  Post Acute Care Choice:    Choice offered to:     DME Arranged:    DME Agency:     HH Arranged:    HH Agency:     Status of Service:  In process, will continue to follow  If discussed at Long Length of Stay Meetings, dates discussed:    Additional Comments: 10/23/2016  Discussed in LOS 10/23/16; pt remains appropriate for continued stay.  Pt is on [place] however continues to have persistant mild stridor.  Per neuro; mental status continues to improve, pt failed swallow test so still on tube feeds via ng tube in addition to IV decadron.  PT eval ordered  10/20/16 Pt extubated today.  Family at bedside Maryclare Labrador, RN 10/23/2016, 9:11 AM

## 2016-10-23 NOTE — Care Management Important Message (Signed)
Important Message  Patient Details  Name: [name] MRN: [id number] Date of Birth: August 07, 1946   Medicare Important Message Given:  Yes    Nathen May 10/23/2016, 9:29 AM

## 2016-10-23 NOTE — Progress Notes (Addendum)
error 

## 2016-10-23 NOTE — Progress Notes (Signed)
Speech Language Pathology Treatment: Dysphagia  Patient Details Name: [name] MRN: [id number] DOB: 21-Sep-1946 Today's Date: 10/23/2016 Time: 8832-5498 SLP Time Calculation (min) (ACUTE ONLY): 18 min  Assessment / Plan / Recommendation Clinical Impression  Pt presented with overt s/s of aspiration (i.e immediate/delayed throat clearing and coughing) following trials of ice chips and thin liquid via tsp. Son present reported her voice is at baseline since recent procedure. SLP provided education for pt being at risk for aspiration. Recommend she remain NPO. Will continue to follow up for PO readiness with likely need for instrumental testing to assess aspiration concerns. May wish to consider proceeding with testing within the next 24-48 hours if clinical improvements are not seen.    HPI HPI: 70 yo female with multiple medical problems including dCHF, GERD, hx ETOH, AFib, CKD, angioedema with chronic upper airway narrowing after an intubation with recent laryngoscopy by ENT with removal of granulation tissue 3 weeks prior to admit but has continued to have dyspnea and stridor. She presented 11/1 to ER with ongoing dyspnea and stridor. She initially improved with steroids, BD and ativan and was seen by ENT who scheduled direct laryngoscopy for 11/2 but wanted cardiology clearance first r/t troponin 0.57. Over time she became increasingly stridorous, tripoding, diaphoretic and required intubation in ER. PCCM called to admit. Pt was intubated 11/2 and extubated 11/6.      SLP Plan  Continue with current plan of care     Recommendations  Diet recommendations: NPO Medication Administration: Via alternative means                Oral Care Recommendations: Oral care QID Follow up Recommendations: Other (comment) (tba) Plan: Continue with current plan of care       [place], Student SLP  Shela Leff 10/23/2016, 11:02 AM

## 2016-10-23 NOTE — Evaluation (Signed)
Physical Therapy Evaluation Patient Details Name: [name] MRN: [id number] DOB: 07-Jun-1946 Today's Date: 10/23/2016   History of Present Illness  70 yo female with multiple medical problems including dCHF, GERD, hx ETOH, AFib, CKD, angioedema with chronic upper airway narrowing after an intubation with recent laryngoscopy by ENT with removal of granulation tissue 3 weeks prior to admit but has continued to have dyspnea and stridor.  She presented 11/1 to ER with ongoing dyspnea and stridor.  She initially improved with steroids, BD and ativan and was seen by ENT who scheduled direct laryngoscopy for 11/2 but wanted cardiology clearance first r/t troponin 0.57. Over time she became increasingly stridorous, tripoding, diaphoretic and required intubation in ER. PCCM called to admit. Pt was intubated 11/2 and extubated 11/6.  Clinical Impression  Patient presents with problems listed below.  Will benefit from acute PT to maximize functional mobility prior to discharge.  Feel patient will progress well with therapy and be able to return home with 24 hour assist and HHPT at d/c.    Follow Up Recommendations Home health PT;Supervision/Assistance - 24 hour    Equipment Recommendations  None recommended by PT    Recommendations for Other Services       Precautions / Restrictions Precautions Precautions: Fall Restrictions Weight Bearing Restrictions: No      Mobility  Bed Mobility Overal bed mobility: Needs Assistance;+2 for physical assistance Bed Mobility: Supine to Sit;Sit to Supine     Supine to sit: Min assist;+2 for physical assistance;HOB elevated Sit to supine: Min assist;+2 for physical assistance   General bed mobility comments: Multiple verbal cues to sit EOB.   Assist to raise trunk and to bring hips to EOB in sitting.  Once upright, patient required min to min guard assist for balance.  Noted BP increased to 177/106.  Patient returned to supine and RN  contacted.  Transfers                 General transfer comment: NT  Ambulation/Gait                Stairs            Wheelchair Mobility    Modified Rankin (Stroke Patients Only)       Balance Overall balance assessment: Needs assistance Sitting-balance support: Single extremity supported;Feet unsupported Sitting balance-Leahy Scale: Fair                                       Pertinent Vitals/Pain Pain Assessment: No/denies pain    Home Living Family/patient expects to be discharged to:: Private residence Living Arrangements: Children Available Help at Discharge: Family;Available PRN/intermittently Type of Home: House Home Access: Stairs to enter Entrance Stairs-Rails: Right Entrance Stairs-Number of Steps: 4 or 13 steps Home Layout: One level Home Equipment: Walker - 2 wheels;Bedside commode      Prior Function Level of Independence: Independent with assistive device(s)         Comments: Recently discharged from [facility] to home.     Hand Dominance        Extremity/Trunk Assessment   Upper Extremity Assessment: Generalized weakness           Lower Extremity Assessment: Generalized weakness         Communication   Communication: No difficulties  Cognition Arousal/Alertness: Awake/alert Behavior During Therapy: Flat affect Overall Cognitive Status: No family/caregiver present to determine baseline  cognitive functioning Area of Impairment: Orientation;Attention;Memory;Following commands;Safety/judgement;Awareness;Problem solving Orientation Level: Disoriented to;Time;Situation Current Attention Level: Sustained (Needs redirection to task) Memory: Decreased short-term memory Following Commands: Follows one step commands with increased time Safety/Judgement: Decreased awareness of safety   Problem Solving: Slow processing;Decreased initiation;Requires verbal cues      General Comments       Exercises     Assessment/Plan    PT Assessment Patient needs continued PT services  PT Problem List Decreased strength;Decreased activity tolerance;Decreased balance;Decreased mobility;Decreased cognition;Decreased knowledge of use of DME;Decreased safety awareness;Cardiopulmonary status limiting activity          PT Treatment Interventions DME instruction;Gait training;Stair training;Functional mobility training;Therapeutic activities;Therapeutic exercise;Balance training;Cognitive remediation;Patient/family education    PT Goals (Current goals can be found in the Care Plan section)  Acute Rehab PT Goals Patient Stated Goal: None stated PT Goal Formulation: Patient unable to participate in goal setting Time For Goal Achievement: 11/06/16 Potential to Achieve Goals: Good    Frequency Min 3X/week   Barriers to discharge        Co-evaluation               End of Session Equipment Utilized During Treatment: Oxygen Activity Tolerance: Patient limited by fatigue;Treatment limited secondary to medical complications (Comment) (Increased BP) Patient left: in bed;with call bell/phone within reach Nurse Communication: Mobility status (Elevated BP)         Time: 1401-1416 PT Time Calculation (min) (ACUTE ONLY): 15 min   Charges:   PT Evaluation $PT Eval Moderate Complexity: 1 Procedure     PT G CodesDespina Pole October 31, 2016, 2:32 PM Carita Pian. Sanjuana Kava, [place] Pager [phone]

## 2016-10-24 ENCOUNTER — Inpatient Hospital Stay (HOSPITAL_COMMUNITY): Payer: Medicare Other

## 2016-10-24 ENCOUNTER — Other Ambulatory Visit: Payer: Self-pay

## 2016-10-24 LAB — CBC
HEMATOCRIT: 37.2 % (ref 36.0–46.0)
HEMOGLOBIN: 10.7 g/dL — AB (ref 12.0–15.0)
MCH: 25.8 pg — AB (ref 26.0–34.0)
MCHC: 28.8 g/dL — AB (ref 30.0–36.0)
MCV: 89.9 fL (ref 78.0–100.0)
Platelets: 322 10*3/uL (ref 150–400)
RBC: 4.14 MIL/uL (ref 3.87–5.11)
RDW: 16.3 % — ABNORMAL HIGH (ref 11.5–15.5)
WBC: 6.7 10*3/uL (ref 4.0–10.5)

## 2016-10-24 LAB — BLOOD GAS, ARTERIAL
Acid-Base Excess: 7.2 mmol/L — ABNORMAL HIGH (ref 0.0–2.0)
Bicarbonate: 34.1 mmol/L — ABNORMAL HIGH (ref 20.0–28.0)
FIO2: 100
O2 CONTENT: 15 L/min
O2 SAT: 99.2 %
PATIENT TEMPERATURE: 98.6
PCO2 ART: 78.4 mmHg — AB (ref 32.0–48.0)
pH, Arterial: 7.261 — ABNORMAL LOW (ref 7.350–7.450)
pO2, Arterial: 320 mmHg — ABNORMAL HIGH (ref 83.0–108.0)

## 2016-10-24 LAB — GLUCOSE, CAPILLARY
GLUCOSE-CAPILLARY: 159 mg/dL — AB (ref 65–99)
GLUCOSE-CAPILLARY: 138 mg/dL — AB (ref 65–99)
GLUCOSE-CAPILLARY: 134 mg/dL — AB (ref 65–99)
GLUCOSE-CAPILLARY: 339 mg/dL — AB (ref 65–99)
GLUCOSE-CAPILLARY: 149 mg/dL — AB (ref 65–99)
Glucose-Capillary: 71 mg/dL (ref 65–99)

## 2016-10-24 LAB — BASIC METABOLIC PANEL
ANION GAP: 8 (ref 5–15)
BUN: 61 mg/dL — AB (ref 6–20)
CHLORIDE: 111 mmol/L (ref 101–111)
CO2: 35 mmol/L — ABNORMAL HIGH (ref 22–32)
Calcium: 9.8 mg/dL (ref 8.9–10.3)
Creatinine, Ser: 1.47 mg/dL — ABNORMAL HIGH (ref 0.44–1.00)
GFR calc Af Amer: 40 mL/min — ABNORMAL LOW (ref >60)
GFR calc non Af Amer: 34 mL/min — ABNORMAL LOW (ref >60)
GLUCOSE: 195 mg/dL — AB (ref 65–99)
POTASSIUM: 3.4 mmol/L — AB (ref 3.5–5.1)
Sodium: 154 mmol/L — ABNORMAL HIGH (ref 135–145)

## 2016-10-24 LAB — MAGNESIUM: Magnesium: 2.4 mg/dL (ref 1.7–2.4)

## 2016-10-24 LAB — URINALYSIS, ROUTINE W REFLEX MICROSCOPIC
BILIRUBIN URINE: NEGATIVE
Glucose, UA: NEGATIVE mg/dL
KETONES UR: NEGATIVE mg/dL
NITRITE: NEGATIVE
PH: 5.5 (ref 5.0–8.0)
Protein, ur: 100 mg/dL — AB
SPECIFIC GRAVITY, URINE: 1.023 (ref 1.005–1.030)

## 2016-10-24 LAB — URINE MICROSCOPIC-ADD ON

## 2016-10-24 LAB — ALBUMIN: ALBUMIN: 2.9 g/dL — AB (ref 3.5–5.0)

## 2016-10-24 MED ORDER — DEXTROSE-NACL 5-0.2 % IV SOLN
INTRAVENOUS | Status: DC
  Administered 2016-10-24: 17:00:00 via INTRAVENOUS
  Filled 2016-10-24 (×4): qty 1000

## 2016-10-24 MED ORDER — LORAZEPAM 2 MG/ML IJ SOLN
INTRAMUSCULAR | Status: AC
  Filled 2016-10-24: qty 1

## 2016-10-24 MED ORDER — DEXTROSE 5 % IV BOLUS
1000.0000 mL | Freq: Once | INTRAVENOUS | Status: AC
  Administered 2016-10-24: 1000 mL via INTRAVENOUS

## 2016-10-24 MED ORDER — HALOPERIDOL LACTATE 5 MG/ML IJ SOLN
INTRAMUSCULAR | Status: AC
  Administered 2016-10-24: 5 mg via INTRAVENOUS
  Filled 2016-10-24: qty 1

## 2016-10-24 MED ORDER — HALOPERIDOL LACTATE 5 MG/ML IJ SOLN
5.0000 mg | Freq: Once | INTRAMUSCULAR | Status: AC
  Administered 2016-10-24: 5 mg via INTRAVENOUS

## 2016-10-24 MED ORDER — LORAZEPAM 2 MG/ML IJ SOLN
1.0000 mg | Freq: Once | INTRAMUSCULAR | Status: AC
Start: 2016-10-24 — End: 2016-10-24
  Administered 2016-10-24: 1 mg via INTRAVENOUS

## 2016-10-24 NOTE — Progress Notes (Signed)
Modified Barium Swallow Progress Note  Patient Details  Name: [name] MRN: [id number] Date of Birth: 28-Sep-1946  Today's Date: 10/24/2016  Modified Barium Swallow completed.  Full report located under Chart Review in the Imaging Section.  Brief recommendations include the following:  Clinical Impression  Pt presents with moderate oral phase and severe pharyngeal sensorimotor dysphagia with decreased bolus cohesion, premature spillage of bolus with delayed swallow initiation to pyriform sinuses, and penetration with nectar and thin liquids. Penetrates could not be cleared. Purees were not penetrated during this study. Pt with moderate base of tongue, vallecular and poster pharyngeal wall residue throughout study. Pt with significant cognitive deficits and uanble to follow Max A multimodal cues for use of compensatory swallow strategies. Given sensorimotor dysphagia, cognitive deficits and inspiratory deficits, pt is at high risk for aspiration. Therefore, ST recommends pt remain NPO.    Swallow Evaluation Recommendations       SLP Diet Recommendations: NPO       Medication Administration: Via alternative means               Oral Care Recommendations: Oral care QID   Other Recommendations: Remove water pitcher   Terris Bodin B. Rutherford Nail, M.S., [place] Pathologist [phone] Xaria Judon 10/24/2016,3:35 PM

## 2016-10-24 NOTE — Progress Notes (Signed)
Speech Language Pathology Treatment: Dysphagia  Patient Details Name: Waylon Koffler MRN: [id number] DOB: 1946-08-26 Today's Date: 10/24/2016 Time: 3888-2800 SLP Time Calculation (min) (ACUTE ONLY): 14 min  Assessment / Plan / Recommendation Clinical Impression  Pt appears more confused and restless today, but shows less overt signs of aspiration. Intermittent wet vocal quality and throat clearing still persists. Recommend to proceed with MBS this afternoon to assess least restrictive diet to hopefully avoid replacement of NGT.   HPI HPI: 70 yo female with multiple medical problems including dCHF, GERD, hx ETOH, AFib, CKD, angioedema with chronic upper airway narrowing after an intubation with recent laryngoscopy by ENT with removal of granulation tissue 3 weeks prior to admit but has continued to have dyspnea and stridor. She presented 11/1 to ER with ongoing dyspnea and stridor. She initially improved with steroids, BD and ativan and was seen by ENT who scheduled direct laryngoscopy for 11/2 but wanted cardiology clearance first r/t troponin 0.57. Over time she became increasingly stridorous, tripoding, diaphoretic and required intubation in ER. PCCM called to admit. Pt was intubated 11/2 and extubated 11/6.      SLP Plan  MBS     Recommendations  Diet recommendations: NPO Medication Administration: Via alternative means                Oral Care Recommendations: Oral care QID Follow up Recommendations:  (tba) Plan: MBS       GO                Germain Osgood 10/24/2016, 10:17 AM  Germain Osgood, M.A. CCC-SLP [phone]

## 2016-10-24 NOTE — Progress Notes (Signed)
FPTS Interim Progress Note  Patient seen by myself and Dr. Juanito Doom at 9.50 PM this evening.  She is asleep but arousable with stridor but satting at 100% on 3L.  CCM was called to assess for stridor earlier in the day.  Suspected the stridor has not worsened.   She was given 5 mg Haldol and 1 mg Ativan earlier this evening for agitation.  Was placed on [place] at that time while receiving sedating meds, not for desaturation. Will continue to monitor closely overnight.    Lovenia Kim, MD 10/24/2016, 9:59 PM PGY-1, [place] Medicine Service pager [phone]

## 2016-10-24 NOTE — Progress Notes (Signed)
[place] Progress Note Patient Name: [name] DOB: 1946-03-26 MRN: [id number]   Date of Service  10/24/2016  HPI/Events of Note  Asked to assess pt for stridor, agitation. Pt assessed on cam and chart reviewed  Then major problem appears to be delirium, don't think the stridor is worse. qTC is prolonged but is unreliable in setting of paced rhythm.   eICU Interventions  Suggest 1 dose haldol 5 mg IV and 1 mg ativan Consider seroquel 50 mg bid We will continue to monitor.     Intervention Category Evaluation Type: Other  Kimie Pidcock 10/24/2016, 3:39 PM

## 2016-10-24 NOTE — Progress Notes (Signed)
Pt pulling at lines and telemetry pt confused placed safety mittens on pt will continue to monitor.

## 2016-10-24 NOTE — Progress Notes (Signed)
10/24/2016 9:33 AM  [name], [name] [id number]  Post-Op Day 4    Temp:  [97.4 F (36.3 C)-98.1 F (36.7 C)] 97.8 F (36.6 C) (11/10 0756) Pulse Rate:  [69-76] 75 (11/10 0756) Resp:  [16-27] 18 (11/10 0756) BP: (128-157)/(80-109) 148/93 (11/10 0756) SpO2:  [98 %-100 %] 100 % (11/10 0756) Weight:  [66.8 kg (147 lb 4.3 oz)] 66.8 kg (147 lb 4.3 oz) (11/10 0350),     Intake/Output Summary (Last 24 hours) at 10/24/16 0933 Last data filed at 10/24/16 0600  Gross per 24 hour  Intake          1271.67 ml  Output              550 ml  Net           721.67 ml    Results for orders placed or performed during the hospital encounter of 10/15/16 (from the past 24 hour(s))  Glucose, capillary     Status: Abnormal   Collection Time: 10/23/16 11:15 AM  Result Value Ref Range   Glucose-Capillary 131 (H) 65 - 99 mg/dL   Comment 1 Notify RN    Comment 2 Document in Chart   Glucose, capillary     Status: Abnormal   Collection Time: 10/23/16  3:13 PM  Result Value Ref Range   Glucose-Capillary 141 (H) 65 - 99 mg/dL   Comment 1 Document in Chart   Glucose, capillary     Status: Abnormal   Collection Time: 10/23/16  9:18 PM  Result Value Ref Range   Glucose-Capillary 207 (H) 65 - 99 mg/dL  Glucose, capillary     Status: Abnormal   Collection Time: 10/24/16  3:55 AM  Result Value Ref Range   Glucose-Capillary 159 (H) 65 - 99 mg/dL  Basic metabolic panel     Status: Abnormal   Collection Time: 10/24/16  4:45 AM  Result Value Ref Range   Sodium 154 (H) 135 - 145 mmol/L   Potassium 3.4 (L) 3.5 - 5.1 mmol/L   Chloride 111 101 - 111 mmol/L   CO2 35 (H) 22 - 32 mmol/L   Glucose, Bld 195 (H) 65 - 99 mg/dL   BUN 61 (H) 6 - 20 mg/dL   Creatinine, Ser 1.47 (H) 0.44 - 1.00 mg/dL   Calcium 9.8 8.9 - 10.3 mg/dL   GFR calc non Af Amer 34 (L) >60 mL/min   GFR calc Af Amer 40 (L) >60 mL/min   Anion gap 8 5 - 15  Magnesium     Status: None   Collection Time: 10/24/16  4:45 AM  Result Value  Ref Range   Magnesium 2.4 1.7 - 2.4 mg/dL  CBC     Status: Abnormal   Collection Time: 10/24/16  4:45 AM  Result Value Ref Range   WBC 6.7 4.0 - 10.5 K/uL   RBC 4.14 3.87 - 5.11 MIL/uL   Hemoglobin 10.7 (L) 12.0 - 15.0 g/dL   HCT 37.2 36.0 - 46.0 %   MCV 89.9 78.0 - 100.0 fL   MCH 25.8 (L) 26.0 - 34.0 pg   MCHC 28.8 (L) 30.0 - 36.0 g/dL   RDW 16.3 (H) 11.5 - 15.5 %   Platelets 322 150 - 400 K/uL  Glucose, capillary     Status: Abnormal   Collection Time: 10/24/16  8:27 AM  Result Value Ref Range   Glucose-Capillary 138 (H) 65 - 99 mg/dL   Comment 1 Notify RN     SUBJECTIVE:  Confused.  No pain.  No dyspnea  OBJECTIVE:  Voice better, and talking freely.  Some insp stridor, esp with vigorous inspiration. No labor  IMPRESSION:  Satisfactory check  PLAN:  Observed for clearance of delirium.  Home when OK per IM.  Recheck my office 2 weeks please.  [name]

## 2016-10-24 NOTE — Significant Event (Signed)
Rapid Response Event Note  Overview:  Called to assist with patient with increasing confusion and RR Time Called: [id number] Arrival Time: 1420 Event Type: Neurologic, Respiratory  Initial Focused Assessment:  On arrival patient supine in bed - alert - warm and dry - pleasantly confused but co-op - will follow commands.  Some exp stridor noted - no use of accessory muscles - some increased RR 24 - bil BS present - difficult to hear secondary to patient talking constantly.  Denies pain.  Tongue without swelling noted.  History reviewed - airway history noted.  RN Judson Roch reports patients stridor no different from AM when ENT examined patient.  Able to speak full sentences.  O2 saturations 100% on RA.  Vpaced at 70.  BP elevated 147/103. RR 28.   Of note also -  patient had  barium swallow test one hour ago - barium residual in oral cavity - oral care done.  PCXR done prior to RRT arrival.    Interventions:  Monitored with staff.  Increasing agitation - chart reviewed - on Xanax at home - none since hospital admit noted. Dr. Gerarda Fraction notified per Inocencio Homes.  Agitation increasing - unable to keep O2 sat monitor on.  Patient continues to speak full sentences- no change in stridor - appears to be more delirium related - now with paranoia and hitting at staff.  Repaged MD to bedside - Dr. Gerarda Fraction to bedside.  Dr. Vaughan Browner from [facility] camered in - update given.  1525:  1 mg Ativan IV given per order. Patient calming some but still restless - continues to speak in full sentence.  1532:  5 mg Haldol IV given per order slowly.  1545:  Patient resting calmly - able to reposition in bed - HOB at 45 degrees - placed on 4 liters nasal O2 for O2 sats of 86% - quickly rebounds to 96% - minimal stridor noted.  Oral care done. Suction at bedside.   BP after meds:  82/59 HR 70 RR 20 - NS bolus 250 cc started  - PCXR without edema - Elink notified along with Dr. Gerarda Fraction.  BP responding to fluids - 93/59 HR 70 RR 22 O2 sats 100%.   Handoff to Production assistant, radio.    Plan of Care (if not transferred):  Monitor vital signs q 15 mins x 4 then q 1 hour x2 - then per unit protocol.  To call as needed.  RRT will follow.   Event Summary: Name of Physician Notified: Dr.  at  (pta RRT)  Name of Consulting Physician Notified: Dr. Learta Codding - PCCM Elink per  at    Outcome: Stayed in room and stabalized     [place], [place]

## 2016-10-24 NOTE — Progress Notes (Signed)
Pt pulled out three IV and her telemetry line already in safteyl mittens, paged Dr Reesa Chew who ordered wrist restraints, will continue to monitor

## 2016-10-24 NOTE — Progress Notes (Signed)
Family Medicine Teaching Service Daily Progress Note Intern Pager: [phone]  Patient name: [name] Medical record number: [id number] Date of birth: [birth year] Age: 70 y.o. Gender: female  Primary Care Provider: Steve Rattler, DO Consultants: CCM Code Status: FULL  Pt Overview and Major Events to Date:  10/5 direct laryngoscopy with biopsy and bronchoscopy for granulation tissue 11/1 Admit to CCM, intubated 11/2 ablation of subepiglottic granulation tissue, failed extubation in OR, re-intubated 11/6 extubated in OR   Assessment and Plan:  70 yo female with multiple medical problems including dCHF, GERD, hx ETOH, AFib, CKD, angioedema with chronic upper airway narrowing after an intubation with recent laryngoscopy by ENT with removal of granulation tissue 3 weeks prior to admit but has continued to have dyspnea and stridor.     #Acute hypoxic respiratory failure 2/2 to stridor from laryngeal stenosis.  Improving.  S/p ablation of subepiglottic granulation tissue 11/2.   -on 1L O2 post extubation   - wean to RA -ENT following, appreciate recs  -NG tube came out spontaneously.  SLP deemed patient unable to swallow independently on 11/9.  SLP will re-evaluate again today.  Appreciate recs.  SLP to do modified barium swallow today.  -po meds switched  to IV (IV pepcid instead of protonix) -Decadron 4mg  q12h 11/8 - 11/10, then 4mg  q24h from 11/11-11/14  -Albuterol prn  -PT - recc HH PT 24 h supervision -OT to see -monitor respiratory status  #encephalopathy likely 2/2 to delirium.  Overnight patient took out 3 IV lines.  Placed on soft restraints.  Family at bedside this AM. Unlikely due to infection at this time as she is afebrile and has no white count.   EEG previously in ICU normal.  Head CT with mild frontal and parietal lobe atrophy bilaterally.  No acute intracranial process.  -Will ask family to help orient the patient -consider sitter if at risk of pulling out IV  again, as IV is the only line to give her meds (not able to swallow).  - take off soft restraints if patient does not appear agitated  -continue to monitor  #Recurrent chest pain 11/7. Resolved.  Troponins downtrending 11/1-11/7.  Right heart cath and left heart cath on 08/19/2016 with normal coronary arteries, severe diastolic dysfunction, normal LV.   -monitor on tele -cards following -Aspirin 81mg  daily -Nitroglycerin prn -Telemetry  #Afib, rate controlled. CHAD-VAsc score of 4.  Patient not on anticoagulation given history of GI bleeding. Pacemaker in place. -cardiology recs -Continue Aspirin 81 mg daily -monitor on tele  #Hypernatremia.  On admission Na+ 140.  This AM 152>154.   -Monitor BMET and urine output  -f/u albumin -Replace electrolytes as needed  #CKD III.  Creatinine this AM 1.37, improved from 1.5 yesterday.  Baseline ~1.3.  BUN uptrended to 73 likely due to Lasix. Lasix held and BUN improved to 73>61 -changed fluids to D5 1/2 to D5 1/4 NS -AM BMET -Avoid nephrotoxic meds  #Leukocytosis.  Resolved, WBC 8.9. Patient has been afebrile.   -Daily CBCs  #Hypertension. Stable. Only on Lasix 20mg  at home. BP 142/73 this morning. -Coreg 6.25mg  bid started this admission .  Will convert to IV if she needs a tube placed.  -Hydralazine prn (d/c'd Labetalol prn)   #COPD, moderate. Albuterol prn at home. -Albuterol neb q6hrs prn  #HLD.  - Continue home Crestor 20mg  daily  #CHF. On Lasix 20mg  at home. -Coreg 6.25mg  bid started this admission -Daily weights  #Type 2 Diabetes, new diagnosis: A1c 6.9% on  11/1. Not on any diabetes meds at home. -CBGs every 4 hours -Sensitive SSI  FEN/GI: NG tube  PPx: IV pepcid, SCDs, Heparin   Disposition: Dispo pending clinical improvement.   Subjective:  Patient stable overnight.  Is on 1 L O2.  Satting at 100%.   Does not report any issues at this time.  States she is not able to swallow on her own as of yet.     Objective: Temp:  [97.4 F (36.3 C)-98.1 F (36.7 C)] 97.8 F (36.6 C) (11/10 0756) Pulse Rate:  [69-76] 75 (11/10 0756) Resp:  [16-27] 18 (11/10 0756) BP: (128-157)/(80-109) 148/93 (11/10 0756) SpO2:  [98 %-100 %] 100 % (11/10 0756) Weight:  [147 lb 4.3 oz (66.8 kg)] 147 lb 4.3 oz (66.8 kg) (11/10 0350) Physical Exam: General: 70 yo F in hospital bed, NGT and [place] in place HENT: NCAT, oropharynx clear PULM: mild upper airway sound, normal air movement and normal respiratory effort CV: RRR, no MRG GI: Belly soft, nontender, ND, +bs MSK: normal bulk and tone, no edema or tenderness  Neuro: AAOx3,  Follows commands speech clear and coherent. EOMI intact.  Laboratory:  Recent Labs Lab 10/21/16 0209 10/22/16 0205 10/24/16 0445  WBC 8.3 8.9 6.7  HGB 10.6* 10.8* 10.7*  HCT 35.9* 37.7 37.2  PLT 393 365 322    Recent Labs Lab 10/22/16 0205 10/23/16 0214 10/24/16 0445  NA 152* 152* 154*  K 4.7 3.9 3.4*  CL 111 109 111  CO2 32 33* 35*  BUN 72* 73* 61*  CREATININE 1.51* 1.37* 1.47*  CALCIUM 10.3 10.3 9.8  GLUCOSE 203* 180* 195*   Imaging/Diagnostic Tests:  Ct Head Wo Contrast  Result Date: 10/21/2016 CLINICAL DATA:  Seizure.  Nonresponsiveness EXAM: CT HEAD WITHOUT CONTRAST TECHNIQUE: Contiguous axial images were obtained from the base of the skull through the vertex without intravenous contrast. COMPARISON:  July 15, 2016 FINDINGS: Brain: The ventricles are normal in size and configuration. There is mild frontal and parietal lobe atrophy bilaterally. There is no intracranial mass, hemorrhage, extra-axial fluid collection, or midline shift gray-white compartments appear unremarkable. No acute infarct evident. Vascular: No hyperdense vessel evident. There is calcification in both carotid siphon regions. There is also calcification in the distal left vertebral artery. Skull: The bony calvarium appears intact. Sinuses/Orbits: There is extensive opacification with retention  cysts and polyps in both maxillary antra. There is mild mucosal thickening in multiple ethmoid air cells bilaterally. Orbits appear symmetric bilaterally. Other: Mastoid air cells are clear. IMPRESSION: Mild frontal and parietal lobe atrophy bilaterally. No intracranial mass hemorrhage, or extra-axial fluid collection. No focal gray - white compartment lesions. There is calcification in both carotid siphon regions. There is extensive opacity in both maxillary antra with much milder mucosal thickening in several ethmoid air cells bilaterally. Electronically Signed   By: Lowella Grip III M.D.   On: 10/21/2016 10:27   Dg Chest Port 1 View  Result Date: 10/21/2016 CLINICAL DATA:  Stridor and dyspnea EXAM: PORTABLE CHEST 1 VIEW COMPARISON:  October 17, 2016 FINDINGS: Enteric tube tip is in the stomach. Pacemaker leads are attached to the right atrium and right ventricle. No pneumothorax. There is atelectatic change in the left lower lobe. Lungs elsewhere are clear. Heart is mildly enlarged with pulmonary vascularity within normal limits. No adenopathy. There is atherosclerotic calcification in the aorta. IMPRESSION: Enteric tube tip in stomach. Atelectasis left base. No edema or consolidation. Stable cardiac silhouette. There is aortic atherosclerosis. Electronically Signed  By: Lowella Grip III M.D.   On: 10/21/2016 10:22   Dg Abd Portable 1v  Result Date: 10/21/2016 CLINICAL DATA:  Feeding tube placement EXAM: PORTABLE ABDOMEN - 1 VIEW COMPARISON:  10/17/2016 FINDINGS: There is motion artifact which limits this examination. The feeding tube tip appears folded back upon itself within the distal stomach. Additional a esophageal tube tip projects over the left sub diaphragm. Nonobstructed bowel gas pattern. Calcified phleboliths in the pelvis. IMPRESSION: Feeding tube is folded back upon itself distally over the distal stomach. Electronically Signed   By: Donavan Foil M.D.   On: 10/21/2016 16:54    Lovenia Kim, MD 10/24/2016, 9:57 AM PGY-1, [place] Intern pager: [phone], text pages welcome   ------------------------------------------------------------------------------------  FMTS Attending Daily Note:  Chrisandra Netters MD Personal pager:  [phone] FPTS Service Pager:  [phone]  I have seen and examined this patient and have reviewed their chart. I have discussed this patient with the resident. I agree with the resident's findings, assessment and care plan.  Additionally:  Mild stridor on exam this afternoon but conversant. Per SLP eval, cannot take PO at this time. Plan is to replace her feeding tube. In the meantime, give her gentle fluids since she has no intake & is hypernatremic. Still mildly confused - discussed concept of ICU delirium with son.  Leeanne Rio, MD 10/23/2016

## 2016-10-24 NOTE — Patient Outreach (Signed)
   Chart review for clinical progression. This RNCM will follow up with [facility] next week for disposition information.

## 2016-10-24 NOTE — Progress Notes (Signed)
FPTS Interim Progress Note  Received page from RN that patient had desatted to mid 67s on 6L O2 per [place] with worsening stridor.  Patient seen by me at 11 PM.   Rapid response was called.  Patient placed on 15 L NRB mask and satting 100%.  Portable CXR ordered and ABG. RT will insert nasal trumpet and attempt to suction secretions. ABG with pH 7.26, pCO2 78.4, pO2 320, Bicarb 34.1.  CXR showed stable cardiomegaly, with no active cardiopulmonary processes.   Discussed with CCM, Dr. Oletta Darter. Dr. Oletta Darter suspects likely due to airway obstruction from OHS given her body habitus as well as sedating meds.   Will trial Bipap and continue to monitor in stepdown unit for now.  Will use caution when suctioning, per recommendations.  Will recheck ABG in 1 hour and we will monitor closely overnight.   Lovenia Kim, MD 10/24/2016, 11:44 PM PGY-1, [place] Medicine Service pager [phone]

## 2016-10-24 NOTE — Progress Notes (Signed)
   We will sign off.  Please call us if there are further questions.

## 2016-10-24 NOTE — Progress Notes (Signed)
Nutrition Follow-up  DOCUMENTATION CODES:   Not applicable  INTERVENTION:    Diet advancement as able per SLP.   If unable to advance diet safely, recommend replace Cortrak tube and resume TF with Jevity 1.2 at 55 ml/h with Pro-stat 30 ml once daily to provide 1684 kcal, 88 gm protein, 1069 ml free water daily.  NUTRITION DIAGNOSIS:   Inadequate oral intake related to inability to eat as evidenced by NPO status.  Ongoing  GOAL:   Patient will meet greater than or equal to 90% of their needs  Unmet  MONITOR:   Diet advancement, PO intake, Skin, I & O's, Labs, Weight trends  ASSESSMENT:   70yo female with multiple medical problems including dCHF, GERD, hx ETOH, AFib, CKD, angioedema with chronic upper airway narrowing after an intubation with recent laryngoscopy by ENT with removal of granulation tissue 3 weeks prior to admit but has continued to have dyspnea and stridor.  She presented 11/1 to ER with ongoing dyspnea and stridor.  She initially improved with steroids, BD and ativan and was seen by ENT who scheduled direct laryngoscopy for 11/2 but wanted cardiology clearance first r/t troponin 0.57.  Over time she became increasingly stridorous, tripoding, diaphoretic and required intubation in ER.   Patient was extubated on 11/6.  Cortrak tube was placed, but patient pulled it out, so TF has been off. S/P MBS with SLP today, no results in chart yet. If unable to safely advance diet, will need Cortrak tube re-placed. Labs reviewed; sodium elevated, potassium low. Medications reviewed and include Decadron, Miralax.  Diet Order:   NPO  Skin:  Reviewed, no issues  Last BM:  11/7  Height:   Ht Readings from Last 1 Encounters:  10/15/16 5\' 2"  (1.575 m)    Weight:   Wt Readings from Last 1 Encounters:  10/24/16 147 lb 4.3 oz (66.8 kg)    Ideal Body Weight:  50 kg  BMI:  Body mass index is 26.94 kg/m.  Estimated Nutritional Needs:   Kcal:   1500-1700  Protein:  80-95 gm  Fluid:  2 L  EDUCATION NEEDS:   No education needs identified at this time  Molli Barrows, [place], [place], [place] Pager [phone] After Hours Pager [phone]

## 2016-10-24 NOTE — Progress Notes (Signed)
FPTS Interim Progress Note  Paged to room for a rapid response due to increased stridor in patient and tachypnea. Patient has become increasingly worse and more agitated per nurse.  O: BP (!) 159/101 (BP Location: Right Arm)   Pulse 73   Temp 97.6 F (36.4 C) (Axillary)   Resp (!) 29   Ht 5\' 2"  (1.575 m)   Wt 147 lb 4.3 oz (66.8 kg)   SpO2 99%   BMI 26.94 kg/m    Upon entrance into room patient is delirious and agitated. She has some audible stridor and mild increase work of breathing; tachypneic. She does not appear to be in respiratory distress. Lungs are clear to ausculation.   A/P: Patient with increased agitation and delirium. Has had a prolonged hospital course with ICU placment. No h/o dementia. Do not believe stridor is due to respiratory distress or worsening airway compromise but more of agitation and anxiousness.  -will consult CCM to take a look at patient to see if they can monitor her respiratory status  -was on home Xanax prn at bedtime for anxiety -will start CIWA protocol on patient; she may be withdrawing some from home Xanax -will give one time dose of Ativan to help with agitation; consider adding Seroquel -CXR ordered without acute process -monitor respiratory status closely -low threshold to consult CCM and re intubate.  -reorient patient as much as possible    Katheren Shams, DO 10/24/2016, 3:08 PM PGY-3, [facility] Medicine Service pager [phone]

## 2016-10-25 LAB — POCT I-STAT 3, ART BLOOD GAS (G3+)
ACID-BASE EXCESS: 7 mmol/L — AB (ref 0.0–2.0)
BICARBONATE: 31.7 mmol/L — AB (ref 20.0–28.0)
O2 SAT: 99 %
PO2 ART: 153 mmHg — AB (ref 83.0–108.0)
Patient temperature: 98.6
TCO2: 33 mmol/L (ref 0–100)
pCO2 arterial: 42.4 mmHg (ref 32.0–48.0)
pH, Arterial: 7.482 — ABNORMAL HIGH (ref 7.350–7.450)

## 2016-10-25 LAB — GLUCOSE, CAPILLARY
GLUCOSE-CAPILLARY: 189 mg/dL — AB (ref 65–99)
GLUCOSE-CAPILLARY: 138 mg/dL — AB (ref 65–99)
Glucose-Capillary: 91 mg/dL (ref 65–99)
Glucose-Capillary: 130 mg/dL — ABNORMAL HIGH (ref 65–99)
Glucose-Capillary: 143 mg/dL — ABNORMAL HIGH (ref 65–99)
Glucose-Capillary: 161 mg/dL — ABNORMAL HIGH (ref 65–99)

## 2016-10-25 LAB — CBC
HEMATOCRIT: 36.9 % (ref 36.0–46.0)
Hemoglobin: 10.4 g/dL — ABNORMAL LOW (ref 12.0–15.0)
MCH: 25.7 pg — ABNORMAL LOW (ref 26.0–34.0)
MCHC: 28.2 g/dL — AB (ref 30.0–36.0)
MCV: 91.3 fL (ref 78.0–100.0)
PLATELETS: 306 10*3/uL (ref 150–400)
RBC: 4.04 MIL/uL (ref 3.87–5.11)
RDW: 15.8 % — AB (ref 11.5–15.5)
WBC: 8.7 10*3/uL (ref 4.0–10.5)

## 2016-10-25 LAB — BLOOD GAS, ARTERIAL
Acid-Base Excess: 7.6 mmol/L — ABNORMAL HIGH (ref 0.0–2.0)
Bicarbonate: 32 mmol/L — ABNORMAL HIGH (ref 20.0–28.0)
Delivery systems: POSITIVE
Drawn by: 41977
EXPIRATORY PAP: 6
FIO2: 40
INSPIRATORY PAP: 12
O2 SAT: 99 %
PH ART: 7.434 (ref 7.350–7.450)
PO2 ART: 159 mmHg — AB (ref 83.0–108.0)
Patient temperature: 98.6
pCO2 arterial: 48.6 mmHg — ABNORMAL HIGH (ref 32.0–48.0)

## 2016-10-25 LAB — BASIC METABOLIC PANEL
Anion gap: 8 (ref 5–15)
BUN: 51 mg/dL — AB (ref 6–20)
CALCIUM: 9.7 mg/dL (ref 8.9–10.3)
CHLORIDE: 110 mmol/L (ref 101–111)
CO2: 34 mmol/L — ABNORMAL HIGH (ref 22–32)
CREATININE: 1.42 mg/dL — AB (ref 0.44–1.00)
GFR, EST AFRICAN AMERICAN: 42 mL/min — AB (ref >60)
GFR, EST NON AFRICAN AMERICAN: 36 mL/min — AB (ref >60)
Glucose, Bld: 101 mg/dL — ABNORMAL HIGH (ref 65–99)
Potassium: 4.1 mmol/L (ref 3.5–5.1)
SODIUM: 152 mmol/L — AB (ref 135–145)

## 2016-10-25 LAB — MAGNESIUM: MAGNESIUM: 2.4 mg/dL (ref 1.7–2.4)

## 2016-10-25 MED ORDER — CHLORHEXIDINE GLUCONATE 0.12 % MT SOLN
15.0000 mL | Freq: Two times a day (BID) | OROMUCOSAL | Status: DC
  Administered 2016-10-25 – 2016-11-04 (×17): 15 mL via OROMUCOSAL
  Filled 2016-10-25 (×11): qty 15

## 2016-10-25 MED ORDER — FAMOTIDINE IN NACL 20-0.9 MG/50ML-% IV SOLN: 20.0000 mg | Freq: Two times a day (BID) | INTRAVENOUS | Status: DC

## 2016-10-25 MED ORDER — DEXTROSE-NACL 5-0.2 % IV SOLN: INTRAVENOUS | Status: DC

## 2016-10-25 MED ORDER — DEXAMETHASONE SODIUM PHOSPHATE 4 MG/ML IJ SOLN
4.0000 mg | Freq: Two times a day (BID) | INTRAMUSCULAR | Status: DC
  Administered 2016-10-25: 4 mg via INTRAVENOUS
  Filled 2016-10-25: qty 1

## 2016-10-25 MED ORDER — HALOPERIDOL LACTATE 5 MG/ML IJ SOLN
1.0000 mg | Freq: Once | INTRAMUSCULAR | Status: AC
  Administered 2016-10-25: 1 mg via INTRAVENOUS
  Filled 2016-10-25: qty 1

## 2016-10-25 MED ORDER — WHITE PETROLATUM GEL
Status: AC
  Administered 2016-10-25: 1
  Filled 2016-10-25: qty 1

## 2016-10-25 MED ORDER — ORAL CARE MOUTH RINSE
15.0000 mL | Freq: Two times a day (BID) | OROMUCOSAL | Status: DC
  Administered 2016-10-26 – 2016-11-03 (×9): 15 mL via OROMUCOSAL

## 2016-10-25 MED ORDER — SODIUM CHLORIDE 0.9 % IV SOLN
10.0000 mg | Freq: Two times a day (BID) | INTRAVENOUS | Status: DC
  Administered 2016-10-25 – 2016-10-28 (×7): 10 mg via INTRAVENOUS
  Filled 2016-10-25 (×9): qty 1

## 2016-10-25 MED ORDER — DEXAMETHASONE SODIUM PHOSPHATE 4 MG/ML IJ SOLN
4.0000 mg | Freq: Four times a day (QID) | INTRAMUSCULAR | Status: DC
  Administered 2016-10-26 – 2016-10-29 (×14): 4 mg via INTRAVENOUS
  Filled 2016-10-25 (×15): qty 1

## 2016-10-25 NOTE — Procedures (Signed)
Pt was taken off of bipap and placed on about 20 mins later due to increased work of breathing. RT will continue to monitor.

## 2016-10-25 NOTE — Progress Notes (Signed)
Notified MD of BP 81/65.  MD said to continue monitoring and no interventions are needed as long as MAP >60. Will continue to monitor patient.

## 2016-10-25 NOTE — Progress Notes (Signed)
RT note: RT called to bedside for patient desat. Patient in respiratory distress. RT placed patient back on BIPAP. Patient WOB and sats improved to 100%. MD aware CCM has been called for consult.

## 2016-10-25 NOTE — Progress Notes (Addendum)
Stridor increasing, sats dropping to 87-88% on 2L [place]. Rapid Response notified. Oxygen increased to 6L, sats remaining mid 80s.  MD notified arrived at bedside.  CXR and ABG ordered, patient placed on Bipap due to ABG results. Will continue to monitor.

## 2016-10-25 NOTE — Significant Event (Addendum)
Rapid Response Event Note RN called for increase stridor and O2 sats dropping to 87-88% 2L [place] Overview: Time Called: 2248 Arrival Time: 2250 Event Type: Respiratory  Initial Focused Assessment: MD's at bedside on my arrival, pt lethargic, easily aroused, follows commands, no use of accessory muscles, expiratory stridor noted to upper airway. Ptplaced on NRB, ABG and PCXR obtained prior to RRT arrival. BP 106/66, HR 75 V paced, RR 21, 100% NRB.   Interventions: RT at bedside to suction upper airway, thick copious secretions suctioned, pt repositioned in bed. Started on [place] (if not transferred): Monitor VS, call as needed, RRT will continue to follow. Hand off to [employer]  Event Summary: Name of Physician Notified: Dr.  at  (pta RRT )  Name of Consulting Physician Notified: Dr. Learta Codding - PCCM Elink  at    Outcome: Stayed in room and stabalized     [name], [name]

## 2016-10-25 NOTE — Progress Notes (Signed)
Called by RN that Pt was noted to be in respiratory distress after coming off BiPAP. Pt also noted to be agitated. We spoke with Dr. Lake Bells earlier in the day who recommended a trial off BiPAP. If Pt failed the trial, we should call CCM for intubation. Pt placed back on BiPAP and back in soft restraints, as she keeps trying to pull off the BiPAP. Have paged CCM x 1. Will page again.  Hyman Bible, MD PGY-2

## 2016-10-25 NOTE — Progress Notes (Signed)
Trial off of BiPAP. Pt off BiPAP for about 40 minutes. Desatted to low 80s with increased work of breathing. Respiratory called and immediately put pt back on BiPAP upon assessment. MD called. Pt work of breathing decreased and pt O2 sats came up to 100. Family, RN, Respiratory, and MD at bedside.

## 2016-10-25 NOTE — Progress Notes (Signed)
PULMONARY / CRITICAL CARE MEDICINE   Name: [name] MRN: [id number] DOB: 12/31/1945    ADMISSION DATE:  10/15/2016  REFERRING MD:  Lita Mains - EDP.  CHIEF COMPLAINT:  Dyspnea, stridor   Brief:    70yo female with multiple medical problems including dCHF, GERD, hx ETOH, AFib, CKD, angioedema with chronic upper airway narrowing after an intubation with recent laryngoscopy by ENT with removal of granulation tissue 3 weeks prior to admit but has continued to have dyspnea and stridor.  She presented 11/1 to ER with ongoing dyspnea and stridor.  She initially improved with steroids, BD and ativan and was seen by ENT who scheduled direct laryngoscopy for 11/2 but wanted cardiology clearance first r/t troponin 0.57.  Over time she became increasingly stridorous, tripoding, diaphoretic and required intubation in ER. PCCM called to admit.   Patient was hospitalized in early September for CHF requiring intubation. October presented to hospital with stridor and dyspnea, worked up by ENT for intubation related granuloma posterior subglottic larynx. On that admission underwent direct laryngoscopy and removal of granuloma.   SIGNIFICANT EVENTS: 10/05: Direct Laryngoscopy with Biopsy and Bronchoscopy for granulation tissue in the posterior subglottis bridging from side to side. 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 THERE IS NO EVIDENCE OF MALIGNANCY. 11/1 > admitted with stridor / respiratory distress requiring intubation. 11/2 > MICRODIRECTLARYNGOSCOPY WITH LASER ABLATION AND KENLOG INJECTION - failed extubaiton in OR and re-intubated 11/ 6 Extubated in OR, stridor after extubation ICU, complains of chest pain, notable confusion 11/7: chest pain. Trop 1.09>1.06>1.0. Card not impressed. unexplained encephalopathy neuro on board. EEG not suggestive for epilepsy. CT negative for acute process  SUBJECTIVE/OVERNIGHT/INTERVAL HX Extubated 11/6, transferred to stepdown on 11/8.   Developed worsening stridor and increased work of breathing with CO2 retention on 11/10 requiring bipap.  Stable on bipap throughout the day 11/11 but worsened again when given a trial off bipap.    VITAL SIGNS: BP (!) 144/90 (BP Location: Right Arm)   Pulse 77   Temp 97.9 F (36.6 C) (Axillary)   Resp 20   Ht 5\' 2"  (1.575 m)   Wt 68.8 kg (151 lb 10.8 oz)   SpO2 100%   BMI 27.74 kg/m   HEMODYNAMICS:  Not on pressors  VENTILATOR SETTINGS: FiO2 (%):  [40 %] 40 %Extubated 11/6  INTAKE / OUTPUT: I/O last 3 completed shifts: In: 2325 [I.V.:2225; IV Piggyback:100] Out: 350 [Urine:350]  PHYSICAL EXAMINATION: General: appears stable, NGT and [place] in place HENT: NCAT, OP clear PULM: mild upper airway sound, normal air movement and normal respiratory effort, speaking in full sentences CV: RRR, no mgr GI: Belly soft, nontender MSK: normal bulk and tone Neuro: awake, oriented to self, place and situation but not time. Follows commands speech clear and coherent. Pupil round and mildly reactive to light. EOMI intact. Motor intact.   LABS:  PULMONARY  Recent Labs Lab 10/24/16 2342 10/25/16 0133  PHART 7.261* 7.434  PCO2ART 78.4* 48.6*  PO2ART 320* 159*  HCO3 34.1* 32.0*  O2SAT 99.2 99.0    CBC  Recent Labs Lab 10/22/16 0205 10/24/16 0445 10/25/16 0329  HGB 10.8* 10.7* 10.4*  HCT 37.7 37.2 36.9  WBC 8.9 6.7 8.7  PLT 365 322 306    COAGULATION No results for input(s): INR in the last 168 hours.  CARDIAC    Recent Labs Lab 10/21/16 0843 10/21/16 1317 10/21/16 1602  TROPONINI 1.09* 1.06* 1.00*   No results for input(s): PROBNP in the last 168 hours.  CHEMISTRY  Recent Labs Lab 10/19/16 0315 10/20/16 0256 10/21/16 0209 10/22/16 0205 10/23/16 0214 10/24/16 0445 10/25/16 0329  NA 146* 146* 147* 152* 152* 154* 152*  K 4.2 3.8 3.2* 4.7 3.9 3.4* 4.1  CL 108 104 103 111 109 111 110  CO2 29 31 31  32 33* 35* 34*  GLUCOSE 155* 171* 234* 203* 180* 195*  101*  BUN 49* 48* 52* 72* 73* 61* 51*  CREATININE 1.59* 1.37* 1.40* 1.51* 1.37* 1.47* 1.42*  CALCIUM 9.6 10.0 10.4* 10.3 10.3 9.8 9.7  MG 2.3 2.2 2.2 2.4 2.4 2.4 2.4  PHOS 3.3 1.8* 2.7 3.0  --   --   --    Estimated Creatinine Clearance: 32.6 mL/min (by C-G formula based on SCr of 1.42 mg/dL (H)).   LIVER  Recent Labs Lab 10/24/16 1055  ALBUMIN 2.9*     INFECTIOUS No results for input(s): LATICACIDVEN, PROCALCITON in the last 168 hours.   ENDOCRINE CBG (last 3)   Recent Labs  10/25/16 1114 10/25/16 1531 10/25/16 2006  GLUCAP 143* 189* 138*    11/7 EKG> paced rhythm  ASSESSMENT / PLAN:  PULM A: Acute hypoxic respiratory failure - due to stridor from laryngeal stenosis following intubation s/p laryngoscopy with laser ablation > increased stridor and WOB 11/10 requiring BiPap Hx OSA, Former smoker  P:   ENT following - will need tracheostomy if requires re-intubation Albuterol PRN Decadron 4mg  q6h 11/1>11/8 Decadron 4mg  q12h 11/8>11/10 Increase decadron to 4Q6 11/11 Trial of high flow nasal canula - if unable to wean or does not tolerate will need to reintubate  CARDIOVASCULAR A:  Recurrent chest pain 11/7. Troponin leak - demand - down-trending dCHF - RHC and LHC 08/19/16 with normal coronary arteries, severe diastolic dysfunction or significant MR, normal LV. Hx HCM, HTN, HLD, CHB, Afib (CHAD-VAsc 4. Not on anticoagulation due to H/O GIB)   P:  Appreciate cardiology Tele monitoring Consider holding home Lasix for elevated BUN Continue home crestor  Hydralazine 5mg  q6h > 10-40 mg q4h as needed SBP>160 PRN Labetalol for Systolic >893  Add back PPI for chest pain - currently on pepcid IV due to NPO status and national shortage of IV PPI  RENAL A:   CKD b/l Cr 1.3-1.5   P:   Monitor BMET and UOP Replace electrolytes as needed Consider holding home lasix   GASTROINTESTINAL A:   GI prophylaxis Nutrition. s/p cortrak 10/17/16 Aspiration of all  consistencies on modified barium swallow P:   SUP: pepcid Will need to have Cortrak replaced    HEMATOLOGIC A:   VTE Prophylaxis P:  SCD's  SQ Heparin   INFECTIOUS A:   Leukocytosis (current steroid regimen)/ improving   P:   Monitor WBC and Fever Curve   ENDOCRINE A:   Hyperglycemia - no hx DM P:   SSI q4H glucose checks   NEUROLOGIC A:   Acute encephalopathy with myoclonus eyelids and upward gaze 11/7, medication related? Seizure? Hx RLS, panic attacks, ETOH abuse (quit 2012), DDD EEG with diffuse cerebral dysfunction, nonspecific for seizure Head CT with mild frontal and parietal lobe atrophy bilaterally. No acute IC process  P:   Neurology following ?MRI  Family updated: son, daughter updated bedside 11/7. No family at bedside today  Interdisciplinary Family Meeting v Palliative Care Meeting:  Due by: 10/21/16.  Reginia Forts, MD, PhD [facility] PCCM

## 2016-10-25 NOTE — Progress Notes (Signed)
Family Medicine Teaching Service Daily Progress Note Intern Pager: [phone]  Patient name: [name] Medical record number: [id number] Date of birth: 01/20/1946 Age: 70 y.o. Gender: female  Primary Care Provider: Steve Rattler, DO Consultants: CCM Code Status: FULL  Pt Overview and Major Events to Date:  10/5 direct laryngoscopy with biopsy and bronchoscopy for granulation tissue 11/1 Admit to CCM, intubated 11/2 ablation of subepiglottic granulation tissue, failed extubation in OR, re-intubated 11/6 extubated in OR   Assessment and Plan: 70 yo female with PMH of CHF, GERD, hx ETOH, AFib, CKD, angioedema with chronic upper airway narrowing after an intubation with recent laryngoscopy by ENT with removal of granulation tissue 3 weeks prior to admit but has continued to have dyspnea and stridor.     #Acute hypoxic respiratory failure 2/2 to stridor from laryngeal stenosis.  Improving.  S/p ablation of subepiglottic granulation tissue 11/2.  ENT following, appreciate recs.  - SLP deemed patient unable to swallow independently on 11/9.   -Modified barium swallow 11/10 - recommend keeping NPO given severe aspiration risk (severe pharyngeal sensorimotor dysphagia)  -po meds switched  to IV (IV pepcid instead of protonix)  -continue Decadron 4mg  q12h -PT - recc HH PT 24 h supervision -OT to see -Overnight patient on 2L per  satting 100%.  Desat to mid 38s on 6L.  Placed on NRB 15L satting 100% but with worsening stridor.  ABG was collected and portable CXR performed.  CCM paged.  ABG with pH 7.26, pCO2 78.  Per CCM recs, placed on Bipap.  Continue to monitor on Bipap in SDU.  Repeat ABG with pH 7.43, pCO2 48.6.  Will continue to monitor. Low threshold to transfer to ICU.  -discuss with CCM this AM, appreciate Dr. Anastasia Pall assistance--> trial off bipap, if signs of respiratory distress, will intubate. Keep Decadron 4mg  Q12  #encephalopathy likely 2/2 to delirium.  Overnight  patient took out 3 IV lines.  Placed on soft restraints.  Family at bedside. Unlikely due to infection at this time as she is afebrile and has no white count.   EEG previously in ICU normal.  Head CT with mild frontal and parietal lobe atrophy bilaterally.  No acute intracranial process.  -Will ask family to help orient the patient -consider sitter if at risk of pulling out IV again, as IV is the only line to give her meds (not able to swallow).  - take off soft restraints if patient does not appear agitated  -continue to monitor  #Recurrent chest pain 11/7. Resolved.  Troponins downtrending 11/1-11/7.  Right heart cath and left heart cath on 08/19/2016 with normal coronary arteries, severe diastolic dysfunction, normal LV.   -monitor on tele -cards following -Aspirin 81mg  daily -Nitroglycerin prn -Telemetry  #Afib, rate controlled. CHAD-VAsc score of 4.  Patient not on anticoagulation given history of GI bleeding. Pacemaker in place. -cardiology recs -Continue Aspirin 81 mg daily -monitor on tele  #Hypernatremia.  On admission Na+ 140.  This AM [id number].   -Monitor BMET and urine output  -changed fluids from D5 1/2 to D5 1/4 NS on 11/10  -albumin 2.9  -Replace electrolytes as needed  #CKD III.  Creatinine this AM 1.37, improved from 1.5 yesterday.  Baseline ~1.3.  BUN uptrended to 73 likely due to Lasix. Lasix held and BUN improved to 73>61. -AM BMET -Avoid nephrotoxic meds  #Leukocytosis.  Resolved, WBC 8.9. Patient has been afebrile.   -Daily CBCs  #Hypertension. Stable. Only on Lasix 20mg  at home. BP  136/95 this morning. -2.5 mg IV Metoprolol Q6 h  -Hydralazine prn  #COPD, moderate. Albuterol prn at home. -Albuterol neb q6hrs prn  #HLD.  - Continue home Crestor 20mg  daily  #CHF. Stable.  On Lasix 20mg  at home. -Coreg 6.25mg  bid started this admission -Daily weights  #Type 2 Diabetes, new diagnosis: A1c 6.9% on 11/1. Not on any diabetes meds at home. -inform patient  she has diabetes when mental status improves -CBGs every 4 hours -Sensitive SSI  FEN/GI: NG tube  PPx: IV pepcid, SCDs, Heparin   Disposition: Continue to monitor in SDU.  Dispo pending clinical improvement.   Subjective:  Patient put on bipap last night due to worsening stridor. Is not able to swallow/tolerate po on her own as of yet.  Son at bedside concerned about increasing agitation and mom not getting nutrition for several days.  Will discuss plan with CCM as she cannot have NG tube feeds if still requiring Bipap.   Objective: Temp:  [97.5 F (36.4 C)-98.2 F (36.8 C)] 98.1 F (36.7 C) (11/11 1100) Pulse Rate:  [70-78] 75 (11/11 1216) Resp:  [15-25] 21 (11/11 1216) BP: (81-155)/(62-117) 95/72 (11/11 1200) SpO2:  [98 %-100 %] 100 % (11/11 1216) FiO2 (%):  [40 %] 40 % (11/11 1216) Weight:  [151 lb 10.8 oz (68.8 kg)] 151 lb 10.8 oz (68.8 kg) (11/11 0310) Physical Exam: General: 70 yo F in hospital bed, with Bipap mask and audible stridor HENT: NCAT, oropharynx clear, vaseline on lips, mouth dry PULM: moderate inspiratory effort of breathing, stridor present CV: RRR, no MRG  GI: Belly soft, nontender, ND, +bs MSK: normal bulk and tone, no edema or tenderness  Neuro: Not oriented but is arousable,  follows commands speech clear and coherent  Laboratory:  Recent Labs Lab 10/22/16 0205 10/24/16 0445 10/25/16 0329  WBC 8.9 6.7 8.7  HGB 10.8* 10.7* 10.4*  HCT 37.7 37.2 36.9  PLT 365 322 306    Recent Labs Lab 10/23/16 0214 10/24/16 0445 10/25/16 0329  NA 152* 154* 152*  K 3.9 3.4* 4.1  CL 109 111 110  CO2 33* 35* 34*  BUN 73* 61* 51*  CREATININE 1.37* 1.47* 1.42*  CALCIUM 10.3 9.8 9.7  GLUCOSE 180* 195* 101*   Imaging/Diagnostic Tests:  Dg Chest Port 1 View  Result Date: 10/24/2016 CLINICAL DATA:  70 y/o  F; stridor. EXAM: PORTABLE CHEST 1 VIEW COMPARISON:  10/24/2016 chest radiograph FINDINGS: Moderate cardiomegaly stable given projection and technique.  Linear opacities at the lung bases probably represents atelectasis or scarring. No focal consolidation. No pneumothorax or pleural effusion. No acute osseous abnormality is evident. IMPRESSION: No active disease.  Stable cardiomegaly. Electronically Signed   By: Kristine Garbe M.D.   On: 10/24/2016 23:37   Dg Chest Port 1 View  Result Date: 10/24/2016 CLINICAL DATA:  Stridor today. EXAM: PORTABLE CHEST 1 VIEW COMPARISON:  Single-view of the chest 10/21/2016. PA and lateral chest 02/04/2016. FINDINGS: There is cardiomegaly without edema. Pacing device is again seen. No pneumothorax or pleural effusion. No focal bony abnormality. Aortic atherosclerosis noted. IMPRESSION: Cardiomegaly without acute disease. Atherosclerosis. Electronically Signed   By: Inge Rise M.D.   On: 10/24/2016 14:52   Dg Abd Portable 1v  Result Date: 10/21/2016 CLINICAL DATA:  Feeding tube placement EXAM: PORTABLE ABDOMEN - 1 VIEW COMPARISON:  10/17/2016 FINDINGS: There is motion artifact which limits this examination. The feeding tube tip appears folded back upon itself within the distal stomach. Additional a esophageal tube tip projects  over the left sub diaphragm. Nonobstructed bowel gas pattern. Calcified phleboliths in the pelvis. IMPRESSION: Feeding tube is folded back upon itself distally over the distal stomach. Electronically Signed   By: Donavan Foil M.D.   On: 10/21/2016 16:54   Dg Swallowing Func-speech Pathology  Result Date: 10/24/2016 Objective Swallowing Evaluation: Type of Study: MBS-Modified Barium Swallow Study Patient Details Name: Epiphany Seltzer MRN: [id number] Date of Birth: 01-14-1946 Today's Date: 10/24/2016 Time: SLP Start Time (ACUTE ONLY): 1300-SLP Stop Time (ACUTE ONLY): 1330 SLP Time Calculation (min) (ACUTE ONLY): 30 min Past Medical History: Past Medical History: Diagnosis Date . Anemia  . Angina  . Angioedema   2/2 ACE . Arteriovenous malformation of stomach  . Arthritis  .  Asthma  . AVM (arteriovenous malformation) of colon   small intestine; stomach . Blood transfusion  . CHF (congestive heart failure) ([place])  . Complete heart block ([place])   s/p PPM 1998 . Complication of anesthesia   Difficult airway; anaphylaxis and swelling with propofol . DDD (degenerative disc disease)  . Depression  . Diastolic heart failure  . Fatty liver 07/26/10 . GERD (gastroesophageal reflux disease)  . GI bleed  . Headache  . History of alcohol abuse Stopped Fall 2012 . History of tobacco use Quit Fall 2012 . Hx of cardiovascular stress test   a. Lexiscan Myoview (10/15):  Small inferolateral and apical defect c/w scar and poss soft tissue attenuation, no ischemia, EF 42% . Hx of colonic polyp 08/13/10  adenomatous . Hx of colonoscopy  . Hyperlipidemia  . Hyperlipidemia  . Hypertension  . Hypertrophic cardiomyopathy ([place])   dx by Dr [name] 2009 . Iron deficiency anemia  . Panic attack  . Panic attacks  . Permanent atrial fibrillation ([place])  . Pneumonia  . Renal failure   baseline creatinine 1.6 . Right arm pain 01/08/2012 . RLS (restless legs syndrome)   Dx 06/2007 . Shortness of breath   sob on exertation . Sleep apnea  Past Surgical History: Past Surgical History: Procedure Laterality Date . CARDIAC CATHETERIZATION   . CARDIAC CATHETERIZATION N/A 08/19/2016  Procedure: Right/Left Heart Cath and Coronary Angiography;  Surgeon: Jolaine Artist, MD;  Location: [place];  Service: Cardiovascular;  Laterality: N/A; . DIRECT LARYNGOSCOPY N/A 09/18/2016  Procedure: DIRECT LARYNGOSCOPY, BRONCHOSCOPY, REMOVAL OF INTUBATION GRANULOMA;  Surgeon: [name], MD;  Location: [facility] OR;  Service: ENT;  Laterality: N/A; . DIRECT LARYNGOSCOPY N/A 10/20/2016  Procedure: EXTUBATION AND FLEXIBLE LARYNGOSCOPE;  Surgeon: [name], MD;  Location: [place];  Service: ENT;  Laterality: N/A; . ESOPHAGOGASTRODUODENOSCOPY  12/23/2011  Procedure: ESOPHAGOGASTRODUODENOSCOPY (EGD);  Surgeon: [name], MD;  Location: [facility];  Service: Endoscopy;  Laterality: N/A; . EXTUBATION (ENDOTRACHEAL) IN OR N/A 07/21/2016  Procedure: EXTUBATION (ENDOTRACHEAL) IN OR;  Surgeon: [name], MD;  Location: [place];  Service: ENT;  Laterality: N/A; . GIVENS CAPSULE STUDY  12/23/2011  Procedure: GIVENS CAPSULE STUDY;  Surgeon: [name], MD;  Location: [facility];  Service: Endoscopy;  Laterality: N/A; . MICROLARYNGOSCOPY WITH LASER N/A 10/16/2016  Procedure: MICRODIRECTLARYNGOSCOPY WITH LASER ABLATION AND KENLOG INJECTION;  Surgeon: [name], MD;  Location: [place];  Service: ENT;  Laterality: N/A; . PACEMAKER INSERTION  1998  st jude, most recent gen change by [name] 4/12 . TUBAL LIGATION  04/01/2000 HPI: 70 yo female with multiple medical problems including dCHF, GERD, hx ETOH, AFib, CKD, angioedema with chronic upper airway narrowing after an intubation with recent laryngoscopy by ENT with removal  of granulation tissue 3 weeks prior to admit but has continued to have dyspnea and stridor. She presented 11/1 to ER with ongoing dyspnea and stridor. She initially improved with steroids, BD and ativan and was seen by ENT who scheduled direct laryngoscopy for 11/2 but wanted cardiology clearance first r/t troponin 0.57. Over time she became increasingly stridorous, tripoding, diaphoretic and required intubation in ER. PCCM called to admit. Pt was intubated 11/2 and extubated 11/6. Subjective: pt alert, cooperative Assessment / Plan / Recommendation CHL IP CLINICAL IMPRESSIONS 10/24/2016 Therapy Diagnosis Moderate oral phase dysphagia;Severe pharyngeal phase dysphagia Clinical Impression Pt presents with moderate oral phase and severe pharyngeal sensorimotor dysphagia with decreased bolus cohesion, premature spillage of bolus with delayed swallow initiation to pyriform sinuses, and penetration with nectar and thin liquids. Penetrates could not be cleared. Purees were not penetrated during this study. Pt with moderate base of tongue,  vallecular and poster pharyngeal wall residue throughout study. Pt with significant cognitive deficits and uanble to follow Max A multimodal cues for use of compensatory swallow strategies. Given sensorimotor dysphagia, cognitive deficits and inspiratory deficits, pt is at high risk for aspiration. Therefore, ST recommends pt remain NPO.  Impact on safety and function Severe aspiration risk   CHL IP TREATMENT RECOMMENDATION 10/24/2016 Treatment Recommendations Therapy as outlined in treatment plan below   Prognosis 10/24/2016 Prognosis for Safe Diet Advancement Fair Barriers to Reach Goals Cognitive deficits Barriers/Prognosis Comment -- CHL IP DIET RECOMMENDATION 10/24/2016 SLP Diet Recommendations NPO Liquid Administration via -- Medication Administration Via alternative means Compensations -- Postural Changes --   CHL IP OTHER RECOMMENDATIONS 10/24/2016 Recommended Consults -- Oral Care Recommendations Oral care QID Other Recommendations Remove water pitcher   CHL IP FOLLOW UP RECOMMENDATIONS 10/24/2016 Follow up Recommendations Other (comment)   CHL IP FREQUENCY AND DURATION 10/24/2016 Speech Therapy Frequency (ACUTE ONLY) min 2x/week Treatment Duration 2 weeks      CHL IP ORAL PHASE 10/24/2016 Oral Phase Impaired Oral - Pudding Teaspoon -- Oral - Pudding Cup -- Oral - Honey Teaspoon -- Oral - Honey Cup -- Oral - Nectar Teaspoon -- Oral - Nectar Cup -- Oral - Nectar Straw -- Oral - Thin Teaspoon -- Oral - Thin Cup -- Oral - Thin Straw -- Oral - Puree Weak lingual manipulation;Delayed oral transit;Decreased bolus cohesion;Premature spillage Oral - Mech Soft -- Oral - Regular -- Oral - Multi-Consistency -- Oral - Pill -- Oral Phase - Comment --  CHL IP PHARYNGEAL PHASE 10/24/2016 Pharyngeal Phase Impaired Pharyngeal- Pudding Teaspoon -- Pharyngeal -- Pharyngeal- Pudding Cup -- Pharyngeal -- Pharyngeal- Honey Teaspoon -- Pharyngeal -- Pharyngeal- Honey Cup -- Pharyngeal -- Pharyngeal- Nectar Teaspoon Delayed  swallow initiation-pyriform sinuses;Reduced tongue base retraction;Penetration/Aspiration before swallow;Pharyngeal residue - valleculae;Pharyngeal residue - posterior pharnyx;Compensatory strategies attempted (with notebox);Reduced pharyngeal peristalsis;Pharyngeal residue - pyriform Pharyngeal Material enters airway, remains ABOVE vocal cords and not ejected out Pharyngeal- Nectar Cup Delayed swallow initiation-pyriform sinuses;Reduced pharyngeal peristalsis;Penetration/Aspiration before swallow;Pharyngeal residue - valleculae;Pharyngeal residue - posterior pharnyx;Compensatory strategies attempted (with notebox);Pharyngeal residue - pyriform Pharyngeal Material enters airway, remains ABOVE vocal cords and not ejected out Pharyngeal- Nectar Straw -- Pharyngeal -- Pharyngeal- Thin Teaspoon Delayed swallow initiation-pyriform sinuses;Reduced pharyngeal peristalsis;Reduced tongue base retraction;Penetration/Aspiration before swallow;Pharyngeal residue - valleculae;Pharyngeal residue - posterior pharnyx;Compensatory strategies attempted (with notebox);Pharyngeal residue - pyriform Pharyngeal Material enters airway, remains ABOVE vocal cords and not ejected out Pharyngeal- Thin Cup -- Pharyngeal -- Pharyngeal- Thin Straw -- Pharyngeal -- Pharyngeal- Puree Reduced pharyngeal peristalsis;Pharyngeal residue - pyriform;Pharyngeal residue - valleculae;Pharyngeal residue -  posterior pharnyx;Delayed swallow initiation-vallecula;Reduced tongue base retraction Pharyngeal -- Pharyngeal- Mechanical Soft -- Pharyngeal -- Pharyngeal- Regular -- Pharyngeal -- Pharyngeal- Multi-consistency -- Pharyngeal -- Pharyngeal- Pill -- Pharyngeal -- Pharyngeal Comment --  CHL IP CERVICAL ESOPHAGEAL PHASE 10/24/2016 Cervical Esophageal Phase WFL Pudding Teaspoon -- Pudding Cup -- Honey Teaspoon -- Honey Cup -- Nectar Teaspoon -- Nectar Cup -- Nectar Straw -- Thin Teaspoon -- Thin Cup -- Thin Straw -- Puree -- Mechanical Soft -- Regular --  Multi-consistency -- Pill -- Cervical Esophageal Comment -- No flowsheet data found. Happi Overton 10/24/2016, 3:34 PM              Lovenia Kim, MD 10/25/2016, 1:22 PM PGY-1, [place] Intern pager: [phone], text pages welcome

## 2016-10-26 LAB — GLUCOSE, CAPILLARY
GLUCOSE-CAPILLARY: 135 mg/dL — AB (ref 65–99)
GLUCOSE-CAPILLARY: 144 mg/dL — AB (ref 65–99)
GLUCOSE-CAPILLARY: 148 mg/dL — AB (ref 65–99)
GLUCOSE-CAPILLARY: 174 mg/dL — AB (ref 65–99)

## 2016-10-26 LAB — URINE CULTURE

## 2016-10-26 LAB — BASIC METABOLIC PANEL
ANION GAP: 10 (ref 5–15)
BUN: 32 mg/dL — AB (ref 6–20)
CO2: 26 mmol/L (ref 22–32)
Calcium: 9.3 mg/dL (ref 8.9–10.3)
Chloride: 109 mmol/L (ref 101–111)
Creatinine, Ser: 1.19 mg/dL — ABNORMAL HIGH (ref 0.44–1.00)
GFR, EST AFRICAN AMERICAN: 52 mL/min — AB (ref >60)
GFR, EST NON AFRICAN AMERICAN: 44 mL/min — AB (ref >60)
Glucose, Bld: 223 mg/dL — ABNORMAL HIGH (ref 65–99)
POTASSIUM: 3.4 mmol/L — AB (ref 3.5–5.1)
SODIUM: 145 mmol/L (ref 135–145)

## 2016-10-26 LAB — CBC
HCT: 35.2 % — ABNORMAL LOW (ref 36.0–46.0)
HEMOGLOBIN: 10.5 g/dL — AB (ref 12.0–15.0)
MCH: 26.3 pg (ref 26.0–34.0)
MCHC: 29.8 g/dL — AB (ref 30.0–36.0)
MCV: 88 fL (ref 78.0–100.0)
Platelets: 248 10*3/uL (ref 150–400)
RBC: 4 MIL/uL (ref 3.87–5.11)
RDW: 15.7 % — ABNORMAL HIGH (ref 11.5–15.5)
WBC: 6.1 10*3/uL (ref 4.0–10.5)

## 2016-10-26 LAB — MAGNESIUM: MAGNESIUM: 2.1 mg/dL (ref 1.7–2.4)

## 2016-10-26 MED ORDER — HALOPERIDOL LACTATE 5 MG/ML IJ SOLN
1.0000 mg | INTRAMUSCULAR | Status: DC | PRN
  Administered 2016-10-26 (×3): 2 mg via INTRAVENOUS
  Filled 2016-10-26 (×3): qty 1

## 2016-10-26 MED ORDER — INSULIN ASPART 100 UNIT/ML ~~LOC~~ SOLN
0.0000 [IU] | SUBCUTANEOUS | Status: DC
  Administered 2016-10-26 – 2016-10-27 (×4): 1 [IU] via SUBCUTANEOUS
  Administered 2016-10-27: 2 [IU] via SUBCUTANEOUS

## 2016-10-26 NOTE — Progress Notes (Signed)
Received pt 10/25/16 at 2130 from 3S in respiratory distress.  Pt anxious, labored, and tachypneic, with continued inspiratory and expiratory stridor.  Supraclavicular retractions noted to increase when talking and on exertion.  E- link video surveillance throughout the shift.  Writer requested pt be intubated on arrival to 40M, no new orders received.  Pt refusing Bipap, preferring to stay on [place] currently at 30lpm, 30% Fi02.  Vital signs have been WNL, and ABG noncritical despite pt's increased WOB.  Will continue to monitor.

## 2016-10-26 NOTE — Progress Notes (Signed)
FPTS Social Note  Went to socially visit patient. She looks much better. A&O. Not in any distress currently. Speaking in short sentences. If respiratory status worsens it appears patient will be intubated. Will likely end up with a tracheostomy.   FPTS will continue to follow socially. We would like to thank CCM for taking care of our patient. We will be glad to accept back when medically stable.   Luiz Blare, DO PGY-3, [facility]

## 2016-10-26 NOTE — Progress Notes (Signed)
   Subjective:    Patient ID: [name], female    DOB: 12/19/1945, 70 y.o.   MRN: [id number]  Called by CCM regarding her return to ICU due to worsened stridor.  Had required BiPAP and now on nasal cannula oxygen.  She reports that she feels she is taking comfortable breaths but breathing is noisy.      Review of Systems     Objective:   Physical Exam Sleeping when I arrived, no stridor.  Nasal cannula oxygen, O2 sat 100%. Upon waking, starts with soft inspiratory stridor.  No tachypnea.  Some hoarseness.     Assessment & Plan:  Glottic stenosis, stridor Stridor seems to have worsened somewhat but she is not in distress currently.  I agree with ICU monitoring and supplemental oxygen.  I will discuss her case with Dr. Erik Obey regarding possible need for further intervention, either endoscopically and/or with tracheostomy.  If her condition worsens in the meantime, she could be intubated.

## 2016-10-26 NOTE — Progress Notes (Signed)
PULMONARY / CRITICAL CARE MEDICINE   Name: [name] MRN: [id number] DOB: 1946-06-09    ADMISSION DATE:  10/15/2016  REFERRING MD:  Lita Mains - EDP.  CHIEF COMPLAINT:  Dyspnea, stridor   Brief:    70yo female with multiple medical problems including dCHF, GERD, hx ETOH, AFib, CKD, angioedema with chronic upper airway narrowing after an intubation with recent laryngoscopy by ENT with removal of granulation tissue 3 weeks prior to admit but has continued to have dyspnea and stridor.  She presented 11/1 to ER with ongoing dyspnea and stridor.  She initially improved with steroids, BD and ativan and was seen by ENT who scheduled direct laryngoscopy for 11/2 but wanted cardiology clearance first r/t troponin 0.57.  Over time she became increasingly stridorous, tripoding, diaphoretic and required intubation in ER. PCCM called to admit.   Patient was hospitalized in early September for CHF requiring intubation. October presented to hospital with stridor and dyspnea, worked up by ENT for intubation related granuloma posterior subglottic larynx. On that admission underwent direct laryngoscopy and removal of granuloma.   SIGNIFICANT EVENTS: 10/05: Direct Laryngoscopy with Biopsy and Bronchoscopy for granulation tissue in the posterior subglottis bridging from side to side. 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 THERE IS NO EVIDENCE OF MALIGNANCY. 11/1 > admitted with stridor / respiratory distress requiring intubation. 11/2 > MICRODIRECTLARYNGOSCOPY WITH LASER ABLATION AND KENLOG INJECTION - failed extubaiton in OR and re-intubated 11/ 6 Extubated in OR, stridor after extubation ICU, complains of chest pain, notable confusion 11/7: chest pain. Trop 1.09>1.06>1.0. Card not impressed. unexplained encephalopathy neuro on board. EEG not suggestive for epilepsy. CT negative for acute process 11/11 brought back to ICU for stridor, treated with high flow  O2  SUBJECTIVE/OVERNIGHT/INTERVAL HX Extubated 11/6, transferred to stepdown on 11/8.  Developed worsening stridor and increased work of breathing with CO2 retention on 11/10 requiring bipap.  Stable on bipap throughout the day 11/11 but worsened again when given a trial off bipap.   Overnight 11/11> 11/12 treated with high flow O2 and her respiratory status improved Confusion persists 11/12  VITAL SIGNS: BP (!) 160/98   Pulse 74   Temp 97.4 F (36.3 C) (Oral)   Resp (!) 21   Ht 5\' 2"  (1.575 m)   Wt 71.5 kg (157 lb 10.1 oz)   SpO2 100%   BMI 28.83 kg/m   HEMODYNAMICS:  Not on pressors  VENTILATOR SETTINGS: FiO2 (%):  [30 %-40 %] 30 %Extubated 11/6  INTAKE / OUTPUT: I/O last 3 completed shifts: In: 2595 [I.V.:3425; IV Piggyback:125] Out: 550 [Urine:550]  PHYSICAL EXAMINATION: General: comfortable, voice strong, audible stridor HENT: NCAT OP clear PULM: upper airway stridor increased slightly from when I examined her last, but no respiratory distress, speaking in full sentences CV: RRR, no mgr GI: BS+, soft, nontender MSK: normal bulk and tone Neuro: Awake, alert, follows commands and oriented, but not long after I left she started pulling off her O2 and pulling at IV's  LABS:  PULMONARY  Recent Labs Lab 10/24/16 2342 10/25/16 0133 10/25/16 2150  PHART 7.261* 7.434 7.482*  PCO2ART 78.4* 48.6* 42.4  PO2ART 320* 159* 153.0*  HCO3 34.1* 32.0* 31.7*  TCO2  --   --  33  O2SAT 99.2 99.0 99.0    CBC  Recent Labs Lab 10/24/16 0445 10/25/16 0329 10/26/16 0248  HGB 10.7* 10.4* 10.5*  HCT 37.2 36.9 35.2*  WBC 6.7 8.7 6.1  PLT 322 306 248    COAGULATION No results for  input(s): INR in the last 168 hours.  CARDIAC    Recent Labs Lab 10/21/16 0843 10/21/16 1317 10/21/16 1602  TROPONINI 1.09* 1.06* 1.00*   No results for input(s): PROBNP in the last 168 hours.   CHEMISTRY  Recent Labs Lab 10/20/16 0256 10/21/16 0209 10/22/16 0205  10/23/16 0214 10/24/16 0445 10/25/16 0329 10/26/16 0248  NA 146* 147* 152* 152* 154* 152* 145  K 3.8 3.2* 4.7 3.9 3.4* 4.1 3.4*  CL 104 103 111 109 111 110 109  CO2 31 31 32 33* 35* 34* 26  GLUCOSE 171* 234* 203* 180* 195* 101* 223*  BUN 48* 52* 72* 73* 61* 51* 32*  CREATININE 1.37* 1.40* 1.51* 1.37* 1.47* 1.42* 1.19*  CALCIUM 10.0 10.4* 10.3 10.3 9.8 9.7 9.3  MG 2.2 2.2 2.4 2.4 2.4 2.4 2.1  PHOS 1.8* 2.7 3.0  --   --   --   --    Estimated Creatinine Clearance: 39.6 mL/min (by C-G formula based on SCr of 1.19 mg/dL (H)).   LIVER  Recent Labs Lab 10/24/16 1055  ALBUMIN 2.9*     INFECTIOUS No results for input(s): LATICACIDVEN, PROCALCITON in the last 168 hours.   ENDOCRINE CBG (last 3)   Recent Labs  10/25/16 2006 10/25/16 2128 10/25/16 2336  GLUCAP 138* 161* 174*    11/7 EKG> paced rhythm  ASSESSMENT / PLAN:  PULM A: Acute hypoxic respiratory failure - due to stridor from laryngeal stenosis following intubation s/p laryngoscopy with laser ablation > increased stridor and WOB 11/11 As of 11/12 currently stable, but will need to go back to OR Hx OSA, Former smoker  P:   ENT following - discussed with Dr. Redmond Baseman this morning.  Will monitor in ICU, intubate if respiratory distress.  Suspect she will need a tracheostomy this morning Albuterol PRN Decadron 4mg  q6h  Continue high flow nasal canula - if unable to wean or does not tolerate will need to reintubate  CARDIOVASCULAR A:  Chest pain > GERD related? NSTEMI dCHF -  Hx HCM, HTN, HLD, CHB, Afib (CHAD-VAsc 4. Not on anticoagulation due to H/O GIB)   P:  Appreciate cardiology Tele monitoring Hold lasix Continue home crestor  Hydralazine 5mg  q6h > 10-40 mg q4h as needed SBP>160  RENAL A:   CKD b/l Cr 1.3-1.5 P:   Monitor BMET and UOP Replace electrolytes as needed   GASTROINTESTINAL A:   GI prophylaxis Nutrition. s/p cortrak 10/17/16 Aspiration of all consistencies on modified barium  swallow P:   SUP: pepcid Will need to have Cortrak replaced   HEMATOLOGIC A:   VTE Prophylaxis P:  SCD's  SQ Heparin   INFECTIOUS A:   Leukocytosis (current steroid regimen)/ improving   P:   Monitor WBC and Fever Curve   ENDOCRINE A:   Hyperglycemia - no hx DM P:   SSI q4H glucose checks   NEUROLOGIC A:   Acute encephalopathy with myoclonus eyelids and upward gaze 11/7, medication related? Seizure? > resolved Some intermittent agitation in ICU, suspect decadron related Hx RLS, panic attacks, ETOH abuse (quit 2012), DDD EEG with diffuse cerebral dysfunction, nonspecific for seizure Head CT with mild frontal and parietal lobe atrophy bilaterally. No acute IC process  P:   Minimize restraints Frequent orientation, lights on during daytime, sit up Haldol prn Wean decadron as able  Family updated: son, daughter updated bedside 11/7. No family at bedside today  Interdisciplinary Family Meeting v Palliative Care Meeting:  Due by: 10/21/16.  Needs close monitoring  in ICU  CC time 32 minutes  Roselie Awkward, MD [place] PCCM Pager: [phone] Cell: [phone] After 3pm or if no response, call [phone]

## 2016-10-27 ENCOUNTER — Inpatient Hospital Stay (HOSPITAL_COMMUNITY): Payer: Medicare Other

## 2016-10-27 ENCOUNTER — Ambulatory Visit: Payer: Self-pay

## 2016-10-27 LAB — GLUCOSE, CAPILLARY
GLUCOSE-CAPILLARY: 122 mg/dL — AB (ref 65–99)
GLUCOSE-CAPILLARY: 132 mg/dL — AB (ref 65–99)
GLUCOSE-CAPILLARY: 178 mg/dL — AB (ref 65–99)
GLUCOSE-CAPILLARY: 189 mg/dL — AB (ref 65–99)
GLUCOSE-CAPILLARY: 208 mg/dL — AB (ref 65–99)
Glucose-Capillary: 206 mg/dL — ABNORMAL HIGH (ref 65–99)
Glucose-Capillary: 136 mg/dL — ABNORMAL HIGH (ref 65–99)
Glucose-Capillary: 133 mg/dL — ABNORMAL HIGH (ref 65–99)
Glucose-Capillary: 135 mg/dL — ABNORMAL HIGH (ref 65–99)

## 2016-10-27 LAB — BASIC METABOLIC PANEL
Anion gap: 6 (ref 5–15)
BUN: 33 mg/dL — AB (ref 6–20)
CALCIUM: 9.6 mg/dL (ref 8.9–10.3)
CHLORIDE: 110 mmol/L (ref 101–111)
CO2: 30 mmol/L (ref 22–32)
CREATININE: 1.13 mg/dL — AB (ref 0.44–1.00)
GFR, EST AFRICAN AMERICAN: 55 mL/min — AB (ref >60)
GFR, EST NON AFRICAN AMERICAN: 47 mL/min — AB (ref >60)
Glucose, Bld: 149 mg/dL — ABNORMAL HIGH (ref 65–99)
Potassium: 3.6 mmol/L (ref 3.5–5.1)
SODIUM: 146 mmol/L — AB (ref 135–145)

## 2016-10-27 LAB — MAGNESIUM: MAGNESIUM: 2.2 mg/dL (ref 1.7–2.4)

## 2016-10-27 MED ORDER — RESOURCE THICKENUP CLEAR PO POWD
ORAL | Status: DC | PRN
  Filled 2016-10-27 (×3): qty 125

## 2016-10-27 MED ORDER — INSULIN ASPART 100 UNIT/ML ~~LOC~~ SOLN
0.0000 [IU] | Freq: Three times a day (TID) | SUBCUTANEOUS | Status: DC
  Administered 2016-10-27 – 2016-10-28 (×6): 2 [IU] via SUBCUTANEOUS
  Administered 2016-10-29 – 2016-11-01 (×6): 1 [IU] via SUBCUTANEOUS
  Administered 2016-11-01: 2 [IU] via SUBCUTANEOUS
  Administered 2016-11-01 – 2016-11-02 (×2): 1 [IU] via SUBCUTANEOUS
  Administered 2016-11-02: 2 [IU] via SUBCUTANEOUS
  Administered 2016-11-02 (×2): 1 [IU] via SUBCUTANEOUS
  Administered 2016-11-03: 2 [IU] via SUBCUTANEOUS
  Administered 2016-11-03: 1 [IU] via SUBCUTANEOUS

## 2016-10-27 MED ORDER — CHLORHEXIDINE GLUCONATE CLOTH 2 % EX PADS: 6.0000 | MEDICATED_PAD | Freq: Once | CUTANEOUS | Status: DC

## 2016-10-27 MED ORDER — DEXTROSE 5 % IV SOLN
INTRAVENOUS | Status: DC
  Administered 2016-10-27: 13:00:00 via INTRAVENOUS

## 2016-10-27 MED ORDER — FREE WATER: 200.0000 mL | Freq: Four times a day (QID) | Status: DC

## 2016-10-27 NOTE — Progress Notes (Signed)
Modified Barium Swallow Progress Note  Patient Details  Name: Tinleigh Whitmire MRN: [id number] Date of Birth: 1946-07-02  Today's Date: 10/27/2016  Modified Barium Swallow completed.  Full report located under Chart Review in the Imaging Section.  Brief recommendations include the following:  Clinical Impression  Pt presents with moderate oral and severe pharyngeal dysphagia. She displayed weak lingual manipulation and decreased bolus cohesion for all PO intake. A delay in swallow initation to the pyriform sinuses was observed for all liquid consistencies. Given pureed and regular solids, a delay in swallow initation to the valleculae was seen, but pt had with adequate airway protection. Silent aspiration was seen with thin liquids and silent pentration occurred given nectar thick liquids. Despite being cued, pt's weak cough was unable to clear penetrates. Honey thick liquids resulted in residue throughout the pharynx that required cues for an additional swallow to minimize. Risk remains for aspiration before and after the swallow with this consistency given her delay in swallow initation and diffuse residue. Lingual residue as well as vallecular residue were consistently viewed following all PO trials. The lingual residue often resulted in the pt spontaneously triggering a secondary, delayed swallow which aided in minimizing residue. Recommend Dys 1 diet with close supervision for second swallow cues. SLP plans to offer honey thick liquids in therapy. Will continue to follow for diet tolerance.   Swallow Evaluation Recommendations       SLP Diet Recommendations: Dysphagia 1 (Puree) solids;Pudding thick liquid   Liquid Administration via: Spoon   Medication Administration: Crushed with puree   Supervision: Patient able to self feed;Full supervision/cueing for compensatory strategies   Compensations: Minimize environmental distractions;Slow rate;Small sips/bites;Other (Comment);Multiple  dry swallows after each bite/sip (cue for second swallow)   Postural Changes: Seated upright at 90 degrees;Remain semi-upright after after feeds/meals (Comment)   Oral Care Recommendations: Oral care BID   Other Recommendations: Order thickener from pharmacy;Prohibited food (jello, ice cream, thin soups);Remove water pitcher   Ezekiel Slocumb, Student SLP  Shela Leff 10/27/2016,10:57 AM

## 2016-10-27 NOTE — Evaluation (Signed)
Occupational Therapy Evaluation Patient Details Name: [name] MRN: [id number] DOB: Mar 13, 1946 Today's Date: 10/27/2016    History of Present Illness 70 yo female with multiple medical problems including dCHF, GERD, hx ETOH, AFib, CKD, angioedema with chronic upper airway narrowing after an intubation with recent laryngoscopy by ENT with removal of granulation tissue 3 weeks prior to admit but has continued to have dyspnea and stridor.  She presented 11/1 to ER with ongoing dyspnea and stridor. Pt was intubated 11/2 and extubated 11/6.   Clinical Impression   Pt admitted with above. She demonstrates the below listed deficits and will benefit from continued OT to maximize safety and independence with BADLs.  Pt demonstrates generalized weakness, decreased activity tolerance, impaired balance as she fatigues.  She requires min A, overall with ADLs.  02 sats remained >96% on 10L HFNC.        Follow Up Recommendations  Home health OT;Supervision/Assistance - 24 hour    Equipment Recommendations  None recommended by OT    Recommendations for Other Services       Precautions / Restrictions Precautions Precautions: Fall Restrictions Weight Bearing Restrictions: No      Mobility Bed Mobility Overal bed mobility: Needs Assistance Bed Mobility: Sit to Supine     Supine to sit: Supervision     General bed mobility comments: Pt fatigued and required supervision for safety   Transfers Overall transfer level: Needs assistance Equipment used: Rolling walker (2 wheeled) Transfers: Sit to/from [employer] Transfers Sit to Stand: Min assist;Mod assist Stand pivot transfers: Min guard       General transfer comment: Pt initially required min guard assist to stand from recliner, but fatigued quickly requiring mod A to stand from [facility] over commode     Balance Overall balance assessment: Needs assistance Sitting-balance support: Feet supported Sitting  balance-Leahy Scale: Fair     Standing balance support: Bilateral upper extremity supported Standing balance-Leahy Scale: Poor Standing balance comment: required min A as she fatigued                             ADL Overall ADL's : Needs assistance/impaired     Grooming: Wash/dry hands;Wash/dry face;Oral care;Brushing hair;Set up;Sitting   Upper Body Bathing: Set up;Sitting   Lower Body Bathing: Minimal assistance;Sit to/from stand   Upper Body Dressing : Set up;Supervision/safety;Sitting   Lower Body Dressing: Minimal assistance;Sit to/from stand   Toilet Transfer: Moderate assistance;Ambulation;Comfort height toilet;Grab bars;RW;BSC Toilet Transfer Details (indicate cue type and reason): Pt fatigued and required mod A to move sit to stand.  She had difficulty controlling descent onto commode due to fatigue  Toileting- Clothing Manipulation and Hygiene: Minimal assistance;Sit to/from stand       Functional mobility during ADLs: Minimal assistance;Moderate assistance;Rolling walker       Vision     Perception     Praxis      Pertinent Vitals/Pain Pain Assessment: No/denies pain     Hand Dominance     Extremity/Trunk Assessment Upper Extremity Assessment Upper Extremity Assessment: Generalized weakness   Lower Extremity Assessment Lower Extremity Assessment: Defer to PT evaluation   Cervical / Trunk Assessment Cervical / Trunk Assessment: Normal   Communication Communication Communication: No difficulties   Cognition Arousal/Alertness: Awake/alert Behavior During Therapy: WFL for tasks assessed/performed Overall Cognitive Status: No family/caregiver present to determine baseline cognitive functioning (? mild judgement deficits )  General Comments       Exercises       Shoulder Instructions      Home Living Family/patient expects to be discharged to:: Private residence Living Arrangements: Children Available  Help at Discharge: Family;Available PRN/intermittently;Available 24 hours/day Type of Home: House Home Access: Stairs to enter [employer] of Steps: 4 or 13 steps Entrance Stairs-Rails: Right Home Layout: One level     Bathroom Shower/Tub: Tub/shower unit;Curtain Shower/tub characteristics: Architectural technologist: Standard     Home Equipment: Environmental consultant - 2 wheels;Bedside commode;Tub bench          Prior Functioning/Environment Level of Independence: Needs assistance  Gait / Transfers Assistance Needed: mod I ADL's / Homemaking Assistance Needed: Pt reports she had recently just began performing ADLs mod I.  She requires assist for IADLs    Comments: Recently discharged from [facility] to home.        OT Problem List: Decreased strength;Decreased activity tolerance;Impaired balance (sitting and/or standing);Decreased cognition;Decreased safety awareness;Decreased knowledge of use of DME or AE;Cardiopulmonary status limiting activity   OT Treatment/Interventions: Self-care/ADL training;Therapeutic exercise;Energy conservation;DME and/or AE instruction;Therapeutic activities;Patient/family education;Balance training;Cognitive remediation/compensation    OT Goals(Current goals can be found in the care plan section) Acute Rehab OT Goals Patient Stated Goal: to get stronger  OT Goal Formulation: With patient Time For Goal Achievement: 11/10/16 Potential to Achieve Goals: Good ADL Goals Pt Will Perform Grooming: with supervision;standing Pt Will Perform Lower Body Bathing: with supervision;sit to/from stand Pt Will Perform Lower Body Dressing: with supervision;sit to/from stand Pt Will Transfer to Toilet: with supervision;ambulating;regular height toilet;bedside commode;grab bars Pt Will Perform Tub/Shower Transfer: Tub transfer;with min guard assist;ambulating;tub bench;grab bars;rolling walker Pt/caregiver will Perform Home Exercise Program: Increased  strength;Both right and left upper extremity;With Supervision;With written HEP provided Additional ADL Goal #1: Pt will be independent with energy conservation technqiues during ADLs  OT Frequency: Min 2X/week   Barriers to D/C:            Co-evaluation              End of Session Equipment Utilized During Treatment: Gait belt;Rolling walker;Oxygen Nurse Communication: Mobility status  Activity Tolerance: Patient limited by fatigue Patient left: in bed;with call bell/phone within reach   Time: 1454-1527 OT Time Calculation (min): 33 min Charges:  OT General Charges $OT Visit: 1 Procedure OT Evaluation $OT Eval Moderate Complexity: 1 Procedure OT Treatments $Self Care/Home Management : 8-22 mins G-Codes:    Leomia Blake M 11/14/2016, 6:12 PM

## 2016-10-27 NOTE — Progress Notes (Signed)
10/27/2016 1:18 PM  [name], [name] [id number]  Post-Op Day 7    Temp:  [97.4 F (36.3 C)-97.7 F (36.5 C)] 97.4 F (36.3 C) (11/13 1137) Pulse Rate:  [70-148] 74 (11/13 1000) Resp:  [15-27] 19 (11/13 1000) BP: (83-164)/(55-102) 130/90 (11/13 1000) SpO2:  [99 %-100 %] 100 % (11/13 1000) FiO2 (%):  [30 %] 30 % (11/13 0300) Weight:  [69 kg (152 lb 1.9 oz)] 69 kg (152 lb 1.9 oz) (11/13 0356),     Intake/Output Summary (Last 24 hours) at 10/27/16 1318 Last data filed at 10/27/16 5465  Gross per 24 hour  Intake               75 ml  Output              550 ml  Net             -475 ml    Results for orders placed or performed during the hospital encounter of 10/15/16 (from the past 24 hour(s))  Glucose, capillary     Status: Abnormal   Collection Time: 10/26/16  1:57 PM  Result Value Ref Range   Glucose-Capillary 135 (H) 65 - 99 mg/dL  Glucose, capillary     Status: Abnormal   Collection Time: 10/26/16  4:15 PM  Result Value Ref Range   Glucose-Capillary 148 (H) 65 - 99 mg/dL  Glucose, capillary     Status: Abnormal   Collection Time: 10/26/16  8:24 PM  Result Value Ref Range   Glucose-Capillary 144 (H) 65 - 99 mg/dL   Comment 1 Notify RN   Basic metabolic panel     Status: Abnormal   Collection Time: 10/27/16  2:13 AM  Result Value Ref Range   Sodium 146 (H) 135 - 145 mmol/L   Potassium 3.6 3.5 - 5.1 mmol/L   Chloride 110 101 - 111 mmol/L   CO2 30 22 - 32 mmol/L   Glucose, Bld 149 (H) 65 - 99 mg/dL   BUN 33 (H) 6 - 20 mg/dL   Creatinine, Ser 1.13 (H) 0.44 - 1.00 mg/dL   Calcium 9.6 8.9 - 10.3 mg/dL   GFR calc non Af Amer 47 (L) >60 mL/min   GFR calc Af Amer 55 (L) >60 mL/min   Anion gap 6 5 - 15  Magnesium     Status: None   Collection Time: 10/27/16  2:13 AM  Result Value Ref Range   Magnesium 2.2 1.7 - 2.4 mg/dL    SUBJECTIVE:  Increasing stridor and work of breathing.    OBJECTIVE:  Louder stridor.  Voice good.  No labored respirations  yet.  IMPRESSION:  Progressive airway difficulty, probably due to re-stenosis.  PLAN:  Discussed with pt.  I would like to place a tracheostomy to help her breathe more freely and get stronger.  May be able to address the larynx more substantively when trach is in place.    [name]

## 2016-10-27 NOTE — Progress Notes (Signed)
Speech Language Pathology Treatment: Dysphagia  Patient Details Name: [name] MRN: [id number] DOB: 14-Sep-1946 Today's Date: 10/27/2016 Time: 8099-8338 SLP Time Calculation (min) (ACUTE ONLY): 11 min  Assessment / Plan / Recommendation Clinical Impression  Pt was upright in the chair. She consumed cup sips of thin liquid, ice chips, and pureed solids. Her mentation for following 1-step commands and attending to simple conversation was improved from last visit. Pt appeared to have an increase in rate of breathing upon completion of PO intake, but her O2 remained at 100. Delayed coughing was also observed after trials. Pt's cough remains weak. Recommend proceeding with MBS for further assessment of her swallow function.   HPI HPI: 70 yo female with multiple medical problems including dCHF, GERD, hx ETOH, AFib, CKD, angioedema with chronic upper airway narrowing after an intubation with recent laryngoscopy by ENT with removal of granulation tissue 3 weeks prior to admit but has continued to have dyspnea and stridor. She presented 11/1 to ER with ongoing dyspnea and stridor. She initially improved with steroids, BD and ativan and was seen by ENT who scheduled direct laryngoscopy for 11/2 but wanted cardiology clearance first r/t troponin 0.57. Over time she became increasingly stridorous, tripoding, diaphoretic and required intubation in ER. PCCM called to admit. Pt was intubated 11/2 and extubated 11/6.      SLP Plan  MBS     Recommendations  Diet recommendations: NPO Medication Administration: Via alternative means Compensations: Minimize environmental distractions;Slow rate;Small sips/bites;Other (Comment);Multiple dry swallows after each bite/sip (cue for second swallow)                Oral Care Recommendations: Oral care QID Follow up Recommendations: Home health SLP;24 hour supervision/assistance Plan: [place], Student  SLP  [name] 10/27/2016, 10:47 AM

## 2016-10-27 NOTE — Progress Notes (Signed)
Physical Therapy Treatment Patient Details Name: [name] MRN: [id number] DOB: 1946/05/14 Today's Date: 10/27/2016    History of Present Illness 70 yo female with multiple medical problems including dCHF, GERD, hx ETOH, AFib, CKD, angioedema with chronic upper airway narrowing after an intubation with recent laryngoscopy by ENT with removal of granulation tissue 3 weeks prior to admit but has continued to have dyspnea and stridor.  She presented 11/1 to ER with ongoing dyspnea and stridor. Pt was intubated 11/2 and extubated 11/6.    PT Comments    Pt pleasant and eager to move and eat. Pt able to ambulate today with sats maintained on 10L HFNC and back to 8L end of session with sats 99%. Pt educated for HEP, transfers and progression with encouragement to increase activity with nursing staff. Will continue to follow.   Sats 98% 8L HR 70 BP 140/83   Follow Up Recommendations  Home health PT;Supervision/Assistance - 24 hour     Equipment Recommendations       Recommendations for Other Services       Precautions / Restrictions Precautions Precautions: Fall    Mobility  Bed Mobility Overal bed mobility: Modified Independent                Transfers Overall transfer level: Needs assistance   Transfers: Sit to/from Stand Sit to Stand: Min guard         General transfer comment: cues for hand placement, sequence and safety  Ambulation/Gait Ambulation/Gait assistance: Min guard Ambulation Distance (Feet): 80 Feet Assistive device: Rolling walker (2 wheeled) Gait Pattern/deviations: Step-through pattern;Decreased stride length;Trunk flexed   Gait velocity interpretation: Below normal speed for age/gender General Gait Details: cues for posture, position in RW and breathing technique   Stairs            Wheelchair Mobility    Modified Rankin (Stroke Patients Only)       Balance                                     Cognition Arousal/Alertness: Awake/alert Behavior During Therapy: WFL for tasks assessed/performed Overall Cognitive Status: Within Functional Limits for tasks assessed                      Exercises General Exercises - Lower Extremity Long Arc Quad: AROM;Both;15 reps;Seated Hip ABduction/ADduction: AROM;15 reps;Both;Seated Hip Flexion/Marching: AROM;15 reps;Both;Seated Toe Raises: AROM;15 reps;Both;Seated    General Comments        Pertinent Vitals/Pain Pain Assessment: No/denies pain    Home Living                      Prior Function            PT Goals (current goals can now be found in the care plan section) Progress towards PT goals: Progressing toward goals    Frequency           PT Plan Current plan remains appropriate    Co-evaluation             End of Session Equipment Utilized During Treatment: Gait belt;Oxygen Activity Tolerance: Patient tolerated treatment well Patient left: in chair;with chair alarm set;with call bell/phone within reach     Time: 0803-0827 PT Time Calculation (min) (ACUTE ONLY): 24 min  Charges:  $Gait Training: 8-22 mins $Therapeutic Exercise: 8-22 mins  G CodesMelford Aase 11-21-2016, 8:30 AM  Elwyn Reach, [place]

## 2016-10-27 NOTE — Care Management Important Message (Signed)
Important Message  Patient Details  Name: [name] MRN: [id number] Date of Birth: [birth year]   Medicare Important Message Given:  Yes    Nathen May 10/27/2016, 10:19 AM

## 2016-10-27 NOTE — Progress Notes (Signed)
PULMONARY / CRITICAL CARE MEDICINE   Name: [name] MRN: [id number] DOB: 01/16/1946    ADMISSION DATE:  10/15/2016  REFERRING MD:  Lita Mains - EDP.  CHIEF COMPLAINT:  Dyspnea, stridor   Brief:    70yo female with multiple medical problems including dCHF, GERD, hx ETOH, AFib, CKD, angioedema with chronic upper airway narrowing after an intubation with recent laryngoscopy by ENT with removal of granulation tissue 3 weeks prior to admit but has continued to have dyspnea and stridor.  She presented 11/1 to ER with ongoing dyspnea and stridor.  She initially improved with steroids, BD and ativan and was seen by ENT who scheduled direct laryngoscopy for 11/2 but wanted cardiology clearance first r/t troponin 0.57.  Over time she became increasingly stridorous, tripoding, diaphoretic and required intubation in ER. PCCM called to admit.   Patient was hospitalized in early September for CHF requiring intubation. October presented to hospital with stridor and dyspnea, worked up by ENT for intubation related granuloma posterior subglottic larynx. On that admission underwent direct laryngoscopy and removal of granuloma.    SIGNIFICANT EVENTS: 10/05: Direct Laryngoscopy with Biopsy and Bronchoscopy for granulation tissue in the posterior subglottis bridging from side to side. 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 THERE IS NO EVIDENCE OF MALIGNANCY. 11/1 > admitted with stridor / respiratory distress requiring intubation. 11/2 > MICRODIRECTLARYNGOSCOPY WITH LASER ABLATION AND KENLOG INJECTION - failed extubaiton in OR and re-intubated 11/ 6 Extubated in OR, stridor after extubation ICU, complains of chest pain, notable confusion 11/7: chest pain. Trop 1.09>1.06>1.0. Card not impressed. unexplained encephalopathy neuro on board. EEG not suggestive for epilepsy. CT negative for acute process 11/11 brought back to ICU for stridor, treated with high flow  O2  SUBJECTIVE/OVERNIGHT/INTERVAL HX Patient on HFNC. She is AAO x3. Denie chest pain. Reports mild shortness of breath.   VITAL SIGNS: BP 125/89 (BP Location: Right Arm)   Pulse 71   Temp 97.6 F (36.4 C) (Oral)   Resp 18   Ht 5\' 2"  (1.575 m)   Wt 69 kg (152 lb 1.9 oz)   SpO2 100%   BMI 27.82 kg/m   HEMODYNAMICS:  Not on pressors  VENTILATOR SETTINGS: FiO2 (%):  [30 %] 30 %  Extubated 11/6  INTAKE / OUTPUT: I/O last 3 completed shifts: In: 1325 [P.O.:50; I.V.:1200; IV Piggyback:75] Out: 1250 [Urine:1250]  PHYSICAL EXAMINATION: General: comfortable, voice strong, audible stridor HENT: NCAT OP clear PULM: mild upper airway stridor, but no respiratory distress, speaking in full sentences on HFNC CV: RRR, no mgr GI: BS+, soft, nontender MSK: normal bulk and tone Neuro: AAO x3, moves ext without problem  LABS:  PULMONARY  Recent Labs Lab 10/24/16 2342 10/25/16 0133 10/25/16 2150  PHART 7.261* 7.434 7.482*  PCO2ART 78.4* 48.6* 42.4  PO2ART 320* 159* 153.0*  HCO3 34.1* 32.0* 31.7*  TCO2  --   --  33  O2SAT 99.2 99.0 99.0    CBC  Recent Labs Lab 10/24/16 0445 10/25/16 0329 10/26/16 0248  HGB 10.7* 10.4* 10.5*  HCT 37.2 36.9 35.2*  WBC 6.7 8.7 6.1  PLT 322 306 248    COAGULATION No results for input(s): INR in the last 168 hours.  CARDIAC    Recent Labs Lab 10/21/16 0843 10/21/16 1317 10/21/16 1602  TROPONINI 1.09* 1.06* 1.00*   No results for input(s): PROBNP in the last 168 hours.   CHEMISTRY  Recent Labs Lab 10/21/16 0209 10/22/16 0205 10/23/16 0214 10/24/16 0445 10/25/16 0329 10/26/16 0248 10/27/16  0213  NA 147* 152* 152* 154* 152* 145 146*  K 3.2* 4.7 3.9 3.4* 4.1 3.4* 3.6  CL 103 111 109 111 110 109 110  CO2 31 32 33* 35* 34* 26 30  GLUCOSE 234* 203* 180* 195* 101* 223* 149*  BUN 52* 72* 73* 61* 51* 32* 33*  CREATININE 1.40* 1.51* 1.37* 1.47* 1.42* 1.19* 1.13*  CALCIUM 10.4* 10.3 10.3 9.8 9.7 9.3 9.6  MG 2.2 2.4 2.4  2.4 2.4 2.1 2.2  PHOS 2.7 3.0  --   --   --   --   --    Estimated Creatinine Clearance: 41 mL/min (by C-G formula based on SCr of 1.13 mg/dL (H)).   LIVER  Recent Labs Lab 10/24/16 1055  ALBUMIN 2.9*     INFECTIOUS No results for input(s): LATICACIDVEN, PROCALCITON in the last 168 hours.   ENDOCRINE CBG (last 3)   Recent Labs  10/26/16 1357 10/26/16 1615 10/26/16 2024  GLUCAP 135* 148* 144*    11/7 EKG> paced rhythm  ASSESSMENT / PLAN:  PULM A: Acute hypoxic respiratory failure - due to stridor from laryngeal stenosis following intubation s/p laryngoscopy with laser ablation > increased stridor and WOB 11/11 As of 11/12 currently stable, but will need to go back to OR Hx OSA, Former smoker  P:   ENT following Albuterol PRN Decadron 4mg  q6h 11/01>11/8, the q12hr to 11/11, then q6h 11/12>> Continue high flow nasal canula - if unable to wean or does not tolerate will need to reintubate with possible trach  CARDIOVASCULAR A:  Chest pain > GERD related? NSTEMI dCHF - Echo 11/2 with EF 55-60%, severe LAE, PAPP 40 Hx HCM, HTN, HLD, CHB, Afib (CHAD-VAsc 4. Not on anticoagulation due to H/O GIB)   P:  Appreciate cardiology Tele monitoring Hold lasix Continue home crestor  Metoprolol IV 2.5 mg q6h Hydralazine 5mg  q6h > 10-40 mg q4h as needed SBP>160  RENAL A:   CKD b/l Cr 1.3-1.5. Cr 1.13 HyperNa P:   Add d5w at 30, avoid NGT fo rnow with airway issues Monitor BMET and UOP Replace electrolytes as needed  GASTROINTESTINAL A:   GI prophylaxis dysphagia Aspiration of all consistencies on modified barium swallow P:   Pepcid Place NGT maybe, want ENt assessment prior SLP on board  HEMATOLOGIC A:   VTE Prophylaxis P:  SCD's  SQ Heparin   INFECTIOUS A:   Leukocytosis: resolved Anemia: stable at 10.5 P:   Limit phelbotomy  ENDOCRINE A:   Hyperglycemia - no hx DM P:   SSI-thin q4H glucose checks  Adding d5 , monitor  trend  NEUROLOGIC A:   Acute encephalopathy with myoclonus eyelids and upward gaze 11/7, medication related? Seizure? > resolved Some intermittent agitation in ICU, suspect decadron related Hx RLS, panic attacks, ETOH abuse (quit 2012), DDD EEG with diffuse cerebral dysfunction, nonspecific for seizure Head CT with mild frontal and parietal lobe atrophy bilaterally. No acute IC process  P:   Minimize restraints Frequent orientation, lights on during daytime, sit up Haldol prn Wean decadron as able  Family updated: son, daughter updated bedside 11/7. No family at bedside today  Interdisciplinary Family Meeting v Palliative Care Meeting:  Due by: 10/21/16.  STAFF NOTE: I, Merrie Roof, MD FACP have personally reviewed patient's available data, including medical history, events of note, physical examination and test results as part of my evaluation. I have discussed with resident/NP and other care providers such as pharmacist, RN and RRT. In addition, I personally evaluated  patient and elicited key findings [id number], in chair , no distress, mild stridor, no distress, speaking well, A O x 3, CTA, with exertions still no distress, SLP today, add d5w low dose for na as no oral access, will look for ent note plan, steroids seem to have improved her, chem in in for na, glu controlled, NO ACEI, did well with haldol, to sdu back to [employer]. Titus Mould, MD, [place] Pgr: [facility] 10/27/2016 9:07 AM

## 2016-10-28 LAB — GLUCOSE, CAPILLARY
GLUCOSE-CAPILLARY: 164 mg/dL — AB (ref 65–99)
GLUCOSE-CAPILLARY: 170 mg/dL — AB (ref 65–99)
GLUCOSE-CAPILLARY: 190 mg/dL — AB (ref 65–99)
GLUCOSE-CAPILLARY: 181 mg/dL — AB (ref 65–99)

## 2016-10-28 LAB — BASIC METABOLIC PANEL
ANION GAP: 7 (ref 5–15)
BUN: 41 mg/dL — ABNORMAL HIGH (ref 6–20)
CALCIUM: 9.2 mg/dL (ref 8.9–10.3)
CO2: 29 mmol/L (ref 22–32)
Chloride: 109 mmol/L (ref 101–111)
Creatinine, Ser: 1.08 mg/dL — ABNORMAL HIGH (ref 0.44–1.00)
GFR, EST AFRICAN AMERICAN: 58 mL/min — AB (ref >60)
GFR, EST NON AFRICAN AMERICAN: 50 mL/min — AB (ref >60)
GLUCOSE: 180 mg/dL — AB (ref 65–99)
POTASSIUM: 3.4 mmol/L — AB (ref 3.5–5.1)
SODIUM: 145 mmol/L (ref 135–145)

## 2016-10-28 MED ORDER — POTASSIUM CHLORIDE 20 MEQ/15ML (10%) PO SOLN
40.0000 meq | Freq: Every day | ORAL | Status: DC
  Administered 2016-10-28: 40 meq via ORAL
  Filled 2016-10-28 (×2): qty 30

## 2016-10-28 MED ORDER — POTASSIUM CHLORIDE CRYS ER 20 MEQ PO TBCR
20.0000 meq | EXTENDED_RELEASE_TABLET | ORAL | Status: AC
  Administered 2016-10-28: 20 meq via ORAL
  Filled 2016-10-28: qty 1

## 2016-10-28 NOTE — Progress Notes (Signed)
SLP Cancellation Note  Patient Details Name: [name] MRN: [id number] DOB: 06-16-1946   Cancelled treatment:       Reason Eval/Treat Not Completed: Medical issues which prohibited therapy. Per RN, pt going for trach and needs to be NPO. Recommend post-procedure that pt remain NPO pending SLP reassessment. May wish to consider PMV evaluation when appropriate too in order to maximize swallowing function. Will change order to reflect NPO status.   Germain Osgood 10/28/2016, 8:58 AM  Germain Osgood, M.A. CCC-SLP [phone]

## 2016-10-28 NOTE — Progress Notes (Signed)
10/28/2016 9:31 AM  [name], [name] [id number]  Post-Op Day 8    Temp:  [97 F (36.1 C)-97.8 F (36.6 C)] 97.8 F (36.6 C) (11/14 0759) Pulse Rate:  [71-77] 75 (11/14 0900) Resp:  [13-21] 21 (11/14 0900) BP: (81-143)/(66-97) 136/87 (11/14 0900) SpO2:  [97 %-100 %] 99 % (11/14 0900) Weight:  [70 kg (154 lb 5.2 oz)] 70 kg (154 lb 5.2 oz) (11/14 0500),     Intake/Output Summary (Last 24 hours) at 10/28/16 0931 Last data filed at 10/28/16 0900  Gross per 24 hour  Intake              795 ml  Output                0 ml  Net              795 ml    Results for orders placed or performed during the hospital encounter of 10/15/16 (from the past 24 hour(s))  Glucose, capillary     Status: Abnormal   Collection Time: 10/27/16 11:38 AM  Result Value Ref Range   Glucose-Capillary 189 (H) 65 - 99 mg/dL   Comment 1 Notify RN    Comment 2 Document in Chart   Glucose, capillary     Status: Abnormal   Collection Time: 10/27/16  4:36 PM  Result Value Ref Range   Glucose-Capillary 133 (H) 65 - 99 mg/dL   Comment 1 Notify RN    Comment 2 Document in Chart   Glucose, capillary     Status: Abnormal   Collection Time: 10/27/16  6:05 PM  Result Value Ref Range   Glucose-Capillary 135 (H) 65 - 99 mg/dL   Comment 1 Notify RN   Glucose, capillary     Status: Abnormal   Collection Time: 10/27/16  9:34 PM  Result Value Ref Range   Glucose-Capillary 178 (H) 65 - 99 mg/dL   Comment 1 Notify RN    Comment 2 Document in Chart   Basic metabolic panel     Status: Abnormal   Collection Time: 10/28/16  2:12 AM  Result Value Ref Range   Sodium 145 135 - 145 mmol/L   Potassium 3.4 (L) 3.5 - 5.1 mmol/L   Chloride 109 101 - 111 mmol/L   CO2 29 22 - 32 mmol/L   Glucose, Bld 180 (H) 65 - 99 mg/dL   BUN 41 (H) 6 - 20 mg/dL   Creatinine, Ser 1.08 (H) 0.44 - 1.00 mg/dL   Calcium 9.2 8.9 - 10.3 mg/dL   GFR calc non Af Amer 50 (L) >60 mL/min   GFR calc Af Amer 58 (L) >60 mL/min   Anion gap 7 5  - 15  Glucose, capillary     Status: Abnormal   Collection Time: 10/28/16  7:55 AM  Result Value Ref Range   Glucose-Capillary 164 (H) 65 - 99 mg/dL   Comment 1 Document in Chart     SUBJECTIVE:  Voice works well.  Breathing feeling somewhat labored.    OBJECTIVE:  Slowly increasing stridor  IMPRESSION:  Glottic stenosis  PLAN:  For tracheostomy, direct laryngoscopy tomorrow early afternoon. Discussed with patient and son today.  [name]

## 2016-10-28 NOTE — Progress Notes (Signed)
Family Medicine Teaching Service Daily Progress Note Intern Pager: [phone]  Patient name: [name] Medical record number: [id number] Date of birth: September 19, 1946 Age: 70 y.o. Gender: female  Primary Care Provider: Steve Rattler, DO Consultants: CCM Code Status: FULL  Pt Overview and Major Events to Date:  10/5 direct laryngoscopy with biopsy and bronchoscopy for granulation tissue 11/1 Admit to CCM, intubated 11/2 ablation of subepiglottic granulation tissue, failed extubation in OR, re-intubated 11/6 extubated in OR  11/9 CCM>FMTS 11/11 FMTS>CCM  11/14 CCM>FMTS  Assessment and Plan: 70 yo female with PMH of CHF, GERD, hx ETOH, AFib, CKD, angioedema with chronic upper airway narrowing after an intubation with recent laryngoscopy by ENT with removal of granulation tissue 3 weeks prior to admit but has continued to have dyspnea and stridor.    #Acute hypoxic respiratory failure 2/2 to stridor from laryngeal stenosis.  On RA however with slowly increasing stridor and slightly labored breathing.  S/p ablation of subepiglottic granulation tissue 11/2.  ENT following.  Plan for ENT to place tracheostomy with direct laryngoscopy early afternoon tomorrow (Dr. Erik Obey).   - SLP recommended dysphagia 1 diet of pureed solids via spoon   -continue Decadron 4mg  q6h   -PT+OT - recc HH PT 24 h supervision  -Patient doing well on RA, satting 100%.  In ICU but FPTS resuming care  #encephalopathy likely 2/2 to delirium.  Resolved.  Patient AAOx3 this AM with son at bedside, stated she did well overnight and has not been agitated  -continue to monitor   #Recurrent chest pain 11/7. Resolved.  Troponins downtrending 11/1-11/7.  Right heart cath and left heart cath on 08/19/2016 with normal coronary arteries, severe diastolic dysfunction, normal LV.   -continue to monitor   #Afib, rate controlled. CHAD-VAsc score of 4.  Patient not on anticoagulation given history of GI bleeding. Pacemaker  in place. -cardiology recs -Continue Aspirin 81 mg daily -monitor on tele  #Hypernatremia.  On admission Na+ 140.  This AM 152>154>152>145.   -Monitor BMET and urine output  -changed fluids from D5 1/2 to D5 1/4 NS on 11/10  -Replace electrolytes as needed  #CKD III.  Creatinine this AM 1.08. Baseline ~1.3.  BUN uptrended to 73 likely due to Lasix. Lasix held and BUN improved to 73>61.  -AM BMET  -Avoid nephrotoxic meds   #Leukocytosis.  Resolved, WBC 8.9. Patient has been afebrile.   -Daily CBCs  #Hypertension. Stable. Only on Lasix 20mg  at home. BP 136/95 this morning. -2.5 mg IV Metoprolol Q6 h  -Hydralazine prn  #COPD, moderate. Albuterol prn at home. -Albuterol neb q6hrs prn  #HLD.  - Continue home Crestor 20mg  daily  #CHF. Stable.  On Lasix 20mg  at home. -Coreg 6.25mg  bid started this admission -Daily weights  #Type 2 Diabetes, new diagnosis: A1c 6.9% on 11/1. Not on any diabetes meds at home. -inform patient she has diabetes when mental status improves -CBGs every 4 hours -Sensitive SSI  FEN/GI: NPO PPx:  SCDs, Heparin   Disposition: Continue to monitor.  Dispo pending clinical improvement.   Subjective:  Patient alert and conversant this AM.  Not agitated.  On RA and satting well.  Son at bedside.  Plan for ENT surgery tomorrow, no concerns at this time.   Objective: Temp:  [97 F (36.1 C)-97.8 F (36.6 C)] 97.7 F (36.5 C) (11/14 1151) Pulse Rate:  [71-77] 73 (11/14 1000) Resp:  [13-23] 23 (11/14 1000) BP: (81-143)/(66-114) 133/114 (11/14 1000) SpO2:  [97 %-100 %] 99 % (11/14  1000) Weight:  [154 lb 5.2 oz (70 kg)] 154 lb 5.2 oz (70 kg) (11/14 0500) Physical Exam: General: awake, alert, in NAD, son at bedside Cardiovascular: RRR, no MRG, 2+ pedal pulses intact Respiratory: audible stridor, mild increased WOB Abdomen: soft, NT, ND +bs Extremities: warm, well perfused, no edema Psych: mood appropriate  Laboratory:  Recent Labs Lab  10/24/16 0445 10/25/16 0329 10/26/16 0248  WBC 6.7 8.7 6.1  HGB 10.7* 10.4* 10.5*  HCT 37.2 36.9 35.2*  PLT 322 306 248    Recent Labs Lab 10/26/16 0248 10/27/16 0213 10/28/16 0212  NA 145 146* 145  K 3.4* 3.6 3.4*  CL 109 110 109  CO2 26 30 29   BUN 32* 33* 41*  CREATININE 1.19* 1.13* 1.08*  CALCIUM 9.3 9.6 9.2  GLUCOSE 223* 149* 180*   Imaging/Diagnostic Tests: No results found. Lovenia Kim, MD 10/28/2016, 12:40 PM PGY-1, [place] Intern pager: [phone], text pages welcome

## 2016-10-28 NOTE — Progress Notes (Signed)
Speech Language Pathology Treatment: Dysphagia  Patient Details Name: [name] MRN: [id number] DOB: 1946-05-22 Today's Date: 10/28/2016 Time: 6808-8110 SLP Time Calculation (min) (ACUTE ONLY): 16 min  Assessment / Plan / Recommendation Clinical Impression  Pt consumed trials of pureed solids with moderate cues for secondary swallows for each bite. SLP provided education to pt and family present about the importance of additional swallows given results from previous MBS. Pt displayed intermediate delayed throat clearing and coughing during trials. Of note, penetration and aspiration were both silent during MBS. Per chart review, pt scheduled for tracheostomy tomorrow. Recommend continuing with Dys 1 diet for today, but for her to remain NPO following tomorrow's procedure. MD may wish to consider new swallow and PMV orders prior to reinitiating diet. Will continue to follow acutely.    HPI HPI: 70 yo female with multiple medical problems including dCHF, GERD, hx ETOH, AFib, CKD, angioedema with chronic upper airway narrowing after an intubation with recent laryngoscopy by ENT with removal of granulation tissue 3 weeks prior to admit but has continued to have dyspnea and stridor. She presented 11/1 to ER with ongoing dyspnea and stridor. She initially improved with steroids, BD and ativan and was seen by ENT who scheduled direct laryngoscopy for 11/2 but wanted cardiology clearance first r/t troponin 0.57. Over time she became increasingly stridorous, tripoding, diaphoretic and required intubation in ER. PCCM called to admit. Pt was intubated 11/2 and extubated 11/6.      SLP Plan  Continue with current plan of care     Recommendations  Diet recommendations: Dysphagia 1 (puree) Medication Administration: Crushed with puree Supervision: Full supervision/cueing for compensatory strategies;Staff to assist with self feeding Compensations: Minimize environmental distractions;Small  sips/bites;Slow rate;Multiple dry swallows after each bite/sip Postural Changes and/or Swallow Maneuvers: Seated upright 90 degrees;Upright 30-60 min after meal                Oral Care Recommendations: Oral care BID Follow up Recommendations: Home health SLP;24 hour supervision/assistance Plan: Continue with current plan of care       [place], Student SLP  Shela Leff 10/28/2016, 1:37 PM

## 2016-10-28 NOTE — Care Management Note (Addendum)
Case Management Note  Patient Details  Name: [name] MRN: [id number] Date of Birth: Aug 11, 1946  Subjective/Objective:     Pt admitted with worsening dyspnea and stridor - hc of laryngeal stenosis          Action/Plan:   PTA pt from home with adult son.  Pt ventilated during assessment - daughter and son at bedside.  Per son pt was independent prior to admit - recently [facility].  Pt would benefit from [facility] for disease management.    Expected Discharge Date:                  Expected Discharge Plan:     In-House Referral:     Discharge planning Services  CM Consult  Post Acute Care Choice:    Choice offered to:     DME Arranged:    DME Agency:     HH Arranged:    HH Agency:     Status of Service:  In process, will continue to follow  If discussed at Long Length of Stay Meetings, dates discussed:    Additional Comments: 10/28/2016  Pt transferred back to ICU 10/25/16 due to continued stridor.  Discussed in LOS 10/28/16; Pt remains appropriate for continued stay.  Plan is for pt to return to OR tomorrow for trach.   CM met at bedside with pt (alert and oriented) and daughter Verdene Lennert - explained that a post acute facility may be warranted dependent upon oxygen needs and overall progression.  Both pt and daughter are open to both [facility] ad SNF if needed, post PT re eval post trach placement..  IF recommendations continue to be HH with 24 hour supervision - family will work on plan to potentially provide 24 hour supervision - very supportive family (4 total children)   CM will continue to follow for discharge needs   10/23/16 Discussed in LOS 10/23/16; pt remains appropriate for continued stay.  Pt is on [place] however continues to have persistant mild stridor.  Per neuro; mental status continues to improve, pt failed swallow test so still on tube feeds via ng tube in addition to IV decadron.  PT eval ordered  10/20/16 Pt extubated today.  Family at  [place], [place], RN 10/28/2016, 9:38 AM

## 2016-10-29 ENCOUNTER — Other Ambulatory Visit: Payer: Self-pay

## 2016-10-29 ENCOUNTER — Inpatient Hospital Stay (HOSPITAL_COMMUNITY): Payer: Medicare Other | Admitting: Certified Registered Nurse Anesthetist

## 2016-10-29 ENCOUNTER — Encounter (HOSPITAL_COMMUNITY)
Admission: EM | Disposition: A | Discharge status: Home Health | Payer: Self-pay | Source: Home / Self Care | Attending: Family Medicine

## 2016-10-29 ENCOUNTER — Encounter (HOSPITAL_COMMUNITY): Payer: Self-pay | Admitting: Certified Registered Nurse Anesthetist

## 2016-10-29 HISTORY — PX: TRACHEOSTOMY TUBE PLACEMENT: SHX814

## 2016-10-29 HISTORY — PX: DIRECT LARYNGOSCOPY: SHX5326

## 2016-10-29 LAB — CBC
HCT: 34.3 % — ABNORMAL LOW (ref 36.0–46.0)
Hemoglobin: 10.3 g/dL — ABNORMAL LOW (ref 12.0–15.0)
MCH: 25.9 pg — AB (ref 26.0–34.0)
MCHC: 30 g/dL (ref 30.0–36.0)
MCV: 86.2 fL (ref 78.0–100.0)
PLATELETS: 232 10*3/uL (ref 150–400)
RBC: 3.98 MIL/uL (ref 3.87–5.11)
RDW: 15.4 % (ref 11.5–15.5)
WBC: 8.9 10*3/uL (ref 4.0–10.5)

## 2016-10-29 LAB — BASIC METABOLIC PANEL
Anion gap: 7 (ref 5–15)
BUN: 43 mg/dL — ABNORMAL HIGH (ref 6–20)
CALCIUM: 9.6 mg/dL (ref 8.9–10.3)
CO2: 29 mmol/L (ref 22–32)
CREATININE: 1.23 mg/dL — AB (ref 0.44–1.00)
Chloride: 109 mmol/L (ref 101–111)
GFR, EST AFRICAN AMERICAN: 50 mL/min — AB (ref >60)
GFR, EST NON AFRICAN AMERICAN: 43 mL/min — AB (ref >60)
Glucose, Bld: 152 mg/dL — ABNORMAL HIGH (ref 65–99)
Potassium: 4.2 mmol/L (ref 3.5–5.1)
SODIUM: 145 mmol/L (ref 135–145)

## 2016-10-29 LAB — GLUCOSE, CAPILLARY
GLUCOSE-CAPILLARY: 150 mg/dL — AB (ref 65–99)
GLUCOSE-CAPILLARY: 124 mg/dL — AB (ref 65–99)
Glucose-Capillary: 110 mg/dL — ABNORMAL HIGH (ref 65–99)

## 2016-10-29 SURGERY — CREATION, TRACHEOSTOMY
Anesthesia: General

## 2016-10-29 MED ORDER — EPINEPHRINE PF 1 MG/ML IJ SOLN
INTRAMUSCULAR | Status: AC
Start: 2016-10-29 — End: 2016-10-29
  Filled 2016-10-29: qty 1

## 2016-10-29 MED ORDER — LIDOCAINE-EPINEPHRINE 1 %-1:100000 IJ SOLN
INTRAMUSCULAR | Status: DC | PRN
Start: 2016-10-29 — End: 2016-10-29
  Administered 2016-10-29: 20 mL

## 2016-10-29 MED ORDER — SUGAMMADEX SODIUM 200 MG/2ML IV SOLN
INTRAVENOUS | Status: DC | PRN
  Administered 2016-10-29: 150 mg via INTRAVENOUS

## 2016-10-29 MED ORDER — HYDROMORPHONE HCL 1 MG/ML IJ SOLN: 0.2500 mg | INTRAMUSCULAR | Status: DC | PRN

## 2016-10-29 MED ORDER — PHENYLEPHRINE HCL 10 MG/ML IJ SOLN
INTRAVENOUS | Status: DC | PRN
  Administered 2016-10-29: 20 ug/min via INTRAVENOUS

## 2016-10-29 MED ORDER — EPHEDRINE SULFATE 50 MG/ML IJ SOLN
INTRAMUSCULAR | Status: DC | PRN
  Administered 2016-10-29: 15 mg via INTRAVENOUS

## 2016-10-29 MED ORDER — LIDOCAINE 2% (20 MG/ML) 5 ML SYRINGE
INTRAMUSCULAR | Status: AC
  Filled 2016-10-29: qty 5

## 2016-10-29 MED ORDER — LIDOCAINE-EPINEPHRINE 1 %-1:100000 IJ SOLN
INTRAMUSCULAR | Status: AC
  Filled 2016-10-29: qty 1

## 2016-10-29 MED ORDER — ROCURONIUM BROMIDE 100 MG/10ML IV SOLN
INTRAVENOUS | Status: DC | PRN
  Administered 2016-10-29: 30 mg via INTRAVENOUS

## 2016-10-29 MED ORDER — MORPHINE SULFATE (PF) 2 MG/ML IV SOLN
1.0000 mg | INTRAVENOUS | Status: DC | PRN
  Administered 2016-10-29 – 2016-10-30 (×3): 1 mg via INTRAVENOUS
  Filled 2016-10-29 (×3): qty 1

## 2016-10-29 MED ORDER — HYDROMORPHONE HCL 1 MG/ML IJ SOLN
INTRAMUSCULAR | Status: AC
  Filled 2016-10-29: qty 0.5

## 2016-10-29 MED ORDER — SUCCINYLCHOLINE CHLORIDE 200 MG/10ML IV SOSY
PREFILLED_SYRINGE | INTRAVENOUS | Status: AC
  Filled 2016-10-29: qty 10

## 2016-10-29 MED ORDER — ETOMIDATE 2 MG/ML IV SOLN
INTRAVENOUS | Status: DC | PRN
  Administered 2016-10-29: 16 mg via INTRAVENOUS

## 2016-10-29 MED ORDER — SUCCINYLCHOLINE CHLORIDE 20 MG/ML IJ SOLN
INTRAMUSCULAR | Status: DC | PRN
  Administered 2016-10-29: 120 mg via INTRAVENOUS

## 2016-10-29 MED ORDER — FENTANYL CITRATE (PF) 100 MCG/2ML IJ SOLN
INTRAMUSCULAR | Status: AC
  Filled 2016-10-29: qty 2

## 2016-10-29 MED ORDER — INSULIN ASPART 100 UNIT/ML ~~LOC~~ SOLN
0.0000 [IU] | Freq: Three times a day (TID) | SUBCUTANEOUS | Status: DC
  Administered 2016-10-31 (×3): 1 [IU] via SUBCUTANEOUS

## 2016-10-29 MED ORDER — FENTANYL CITRATE (PF) 100 MCG/2ML IJ SOLN
INTRAMUSCULAR | Status: DC | PRN
  Administered 2016-10-29: 50 ug via INTRAVENOUS

## 2016-10-29 MED ORDER — ONDANSETRON HCL 4 MG/2ML IJ SOLN
INTRAMUSCULAR | Status: AC
  Filled 2016-10-29: qty 2

## 2016-10-29 MED ORDER — LACTATED RINGERS IV SOLN
INTRAVENOUS | Status: DC
  Administered 2016-10-29: 50 mL/h via INTRAVENOUS
  Administered 2016-10-30: 04:00:00 via INTRAVENOUS

## 2016-10-29 MED ORDER — ETOMIDATE 2 MG/ML IV SOLN
INTRAVENOUS | Status: AC
  Filled 2016-10-29: qty 10

## 2016-10-29 MED ORDER — PHENYLEPHRINE HCL 10 MG/ML IJ SOLN
INTRAMUSCULAR | Status: DC | PRN
  Administered 2016-10-29: 120 ug via INTRAVENOUS
  Administered 2016-10-29 (×2): 80 ug via INTRAVENOUS

## 2016-10-29 MED ORDER — ONDANSETRON HCL 4 MG/2ML IJ SOLN
INTRAMUSCULAR | Status: DC | PRN
  Administered 2016-10-29: 4 mg via INTRAVENOUS

## 2016-10-29 MED ORDER — 0.9 % SODIUM CHLORIDE (POUR BTL) OPTIME
TOPICAL | Status: DC | PRN
  Administered 2016-10-29: 1000 mL

## 2016-10-29 MED ORDER — HYDROCODONE-ACETAMINOPHEN 7.5-325 MG/15ML PO SOLN
5.0000 mL | ORAL | Status: DC | PRN
  Administered 2016-11-02: 10 mL
  Filled 2016-10-29: qty 15

## 2016-10-29 MED ORDER — LIDOCAINE HCL (CARDIAC) 20 MG/ML IV SOLN
INTRAVENOUS | Status: DC | PRN
  Administered 2016-10-29: 80 mg via INTRAVENOUS

## 2016-10-29 MED ORDER — PROPOFOL 10 MG/ML IV BOLUS
INTRAVENOUS | Status: DC | PRN
Start: 2016-10-29 — End: 2016-10-29

## 2016-10-29 MED ORDER — HYDROMORPHONE HCL 1 MG/ML IJ SOLN
0.5000 mg | INTRAMUSCULAR | Status: DC | PRN
  Administered 2016-10-29: 0.5 mg via INTRAVENOUS

## 2016-10-29 MED ORDER — SUGAMMADEX SODIUM 200 MG/2ML IV SOLN
INTRAVENOUS | Status: AC
  Filled 2016-10-29: qty 2

## 2016-10-29 MED ORDER — OXYMETAZOLINE HCL 0.05 % NA SOLN
NASAL | Status: AC
  Filled 2016-10-29: qty 15

## 2016-10-29 MED ORDER — FAMOTIDINE 20 MG PO TABS: 10.0000 mg | ORAL_TABLET | Freq: Every day | ORAL | Status: DC

## 2016-10-29 MED ORDER — DEXAMETHASONE SODIUM PHOSPHATE 10 MG/ML IJ SOLN
INTRAMUSCULAR | Status: AC
  Filled 2016-10-29: qty 1

## 2016-10-29 MED ORDER — TRIAMCINOLONE ACETONIDE 40 MG/ML IJ SUSP
INTRAMUSCULAR | Status: AC
Start: 2016-10-29 — End: 2016-10-29
  Filled 2016-10-29: qty 5

## 2016-10-29 SURGICAL SUPPLY — 43 items
BALLN PULM 15 16.5 18 X 75CM (BALLOONS)
BALLN PULM 15 16.5 18X75 (BALLOONS)
BALLOON PULM 15 16.5 18X75 (BALLOONS) IMPLANT
BLADE SURG 15 STRL LF DISP TIS (BLADE) ×1 IMPLANT
BLADE SURG 15 STRL SS (BLADE) ×2
BLADE SURG ROTATE 9660 (MISCELLANEOUS) IMPLANT
CANISTER SUCTION 2500CC (MISCELLANEOUS) ×3 IMPLANT
CLEANER TIP ELECTROSURG 2X2 (MISCELLANEOUS) ×3 IMPLANT
CONT SPEC 4OZ CLIKSEAL STRL BL (MISCELLANEOUS) IMPLANT
COVER SURGICAL LIGHT HANDLE (MISCELLANEOUS) ×3 IMPLANT
COVER TABLE BACK 60X90 (DRAPES) ×3 IMPLANT
DECANTER SPIKE VIAL GLASS SM (MISCELLANEOUS) ×3 IMPLANT
DRAPE PROXIMA HALF (DRAPES) ×3 IMPLANT
ELECT COATED BLADE 2.86 ST (ELECTRODE) ×3 IMPLANT
ELECT REM PT RETURN 9FT ADLT (ELECTROSURGICAL) ×3
ELECTRODE REM PT RTRN 9FT ADLT (ELECTROSURGICAL) ×1 IMPLANT
GLOVE BIOGEL PI IND STRL 7.0 (GLOVE) ×1 IMPLANT
GLOVE BIOGEL PI INDICATOR 7.0 (GLOVE) ×2
GLOVE ECLIPSE 8.0 STRL XLNG CF (GLOVE) ×6 IMPLANT
GLOVE SURG SS PI 6.5 STRL IVOR (GLOVE) ×3 IMPLANT
GOWN STRL REUS W/ TWL LRG LVL3 (GOWN DISPOSABLE) ×2 IMPLANT
GOWN STRL REUS W/ TWL XL LVL3 (GOWN DISPOSABLE) ×2 IMPLANT
GOWN STRL REUS W/TWL LRG LVL3 (GOWN DISPOSABLE) ×4
GOWN STRL REUS W/TWL XL LVL3 (GOWN DISPOSABLE) ×4
GUARD TEETH (MISCELLANEOUS) ×3 IMPLANT
KIT BASIN OR (CUSTOM PROCEDURE TRAY) ×3 IMPLANT
KIT ROOM TURNOVER OR (KITS) ×3 IMPLANT
NEEDLE HYPO 25GX1X1/2 BEV (NEEDLE) ×3 IMPLANT
NS IRRIG 1000ML POUR BTL (IV SOLUTION) ×3 IMPLANT
PAD ARMBOARD 7.5X6 YLW CONV (MISCELLANEOUS) ×6 IMPLANT
PENCIL BUTTON HOLSTER BLD 10FT (ELECTRODE) ×3 IMPLANT
SOLUTION ANTI FOG 6CC (MISCELLANEOUS) IMPLANT
SPONGE DRAIN TRACH 4X4 STRL 2S (GAUZE/BANDAGES/DRESSINGS) ×3 IMPLANT
SUT CHROMIC 2 0 SH (SUTURE) ×3 IMPLANT
SUT ETHILON 2 0 FS 18 (SUTURE) IMPLANT
SUT SILK 2 0 SH CR/8 (SUTURE) ×3 IMPLANT
SYR INFLATE BILIARY GAUGE (MISCELLANEOUS) IMPLANT
TOWEL OR 17X24 6PK STRL BLUE (TOWEL DISPOSABLE) ×3 IMPLANT
TRAY ENT MC OR (CUSTOM PROCEDURE TRAY) ×3 IMPLANT
TUBE CONNECTING 12'X1/4 (SUCTIONS) ×1
TUBE CONNECTING 12X1/4 (SUCTIONS) ×2 IMPLANT
TUBE TRACH SHILEY 4 DIST CUF (TUBING) ×3 IMPLANT
WATER STERILE IRR 1000ML POUR (IV SOLUTION) ×3 IMPLANT

## 2016-10-29 NOTE — Op Note (Signed)
10/29/2016 3:13 PM  [name],  [name] [id number]  Pre-Op Dx: glottic stenosis  Post-Op Dx:   same  Proc:  Tracheostomy, Direct Laryngoscopy  Surg:  [name]  Anes:  GOT  EBL:  min  Comp:   none  Findings:  Small caliber trachea.  Small thyroid isthmus divided in midline.  Posterior glottic scar tissue below cords. Polypoid changes of cords.  Procedure:  The patient was brought from the intensive care unit to the operating room and transferred to an operating table.  GOT anesthesia was induced without difficulty.   The patient was placed in a slight reverse Trendelenburg.  Neck extension was achieved as possible.  The lower neck was palpated with the findings as described above.  1% Xylocaine with 1:100,000 epinephrine, 10 cc's, was infiltrated into the surgical field for intraoperative hemostasis.  Several minutes were allowed for this to take effect.  A Hibiclens sterile preparation  of the lower neck and upper chest was performed in the standard fashion.  Sterile draping was accomplished in the standard fashion.  A  4 cm transverse incision was made sharply approximately halfway between the sternal notch and cricoid cartilage and extended through skin and subcutaneous fat.  Using cautery, the superficial layer of the deep cervical fascia was lysed.  Additional dissection revealed the strap muscles.  The midline raphe was divided in two layers and the muscles retracted laterally.  The pretracheal plane was visualized.  This was entered bluntly.  The thyroid isthmus was isolated between hemostats, divided, and controlled with 2-0 silk suture ligatures.  The thyroid gland was retracted to either side.  The anterior face of the trachea was cleared.  In the  2-3 interspace, a transverse incision was made between cartilage rings into the tracheal lumen. The lower cartilage ring was secured to the lower wound with a 2-0 chromic suture.  A previously tested  # 4 Shiley cuffed  tracheostomy tube was brought into the field.  With the endotracheal tube under direct visualization through the tracheostomy, it was gently backed up.  The tracheostomy tube was inserted into the tracheal lumen.  Hemostasis was observed. The cuff was inflated and observed to be intact and containing pressure. The inner cannula was placed and ventilation assumed per tracheostomy tube.  Good chest wall motion was observed, and CO2 was documented per anesthesia.  The trach tube was secured in the standard fashion with cotton twill ties.  Hemostasis was observed again.  When satisfactory ventilation was assured, the orotracheal tube was removed.    A rubber tooth guard was placed.  The anterior commissure laryngoscope was introduced and the larynx visualized.  The 0 degree Hopkins rod telescope was placed into the subglottic area with the findings as described above.  No further intervention was felt necessary.    At this point the procedure was completed.  The patient was returned to anesthesia, awakened as possible, and transferred back to the intensive care unit in stable condition.  Comment: 70 y.o. bf with subglottic stenosis was the indication for today's procedure.  Anticipate a routine postoperative recovery including standard tracheal hygiene.  We will change the trach ties at four days but not use Velcro ties until seven days.  When the patient no longer requires ventilator or pressure support, the cuff should be deflated.  Change to an uncuffed tube and downsizing will be according to the clinical condition of the patient.

## 2016-10-29 NOTE — Anesthesia Preprocedure Evaluation (Addendum)
Anesthesia Evaluation  Patient identified by MRN, date of birth, ID band Patient awake    Reviewed: Allergy & Precautions, NPO status , Patient's Chart, lab work & pertinent test results  Airway Mallampati: II  TM Distance: >3 FB     Dental   Pulmonary asthma , sleep apnea , pneumonia, COPD, former smoker,    breath sounds clear to auscultation       Cardiovascular hypertension, + angina +CHF  + dysrhythmias + pacemaker  Rhythm:Regular Rate:Normal     Neuro/Psych  Headaches,    GI/Hepatic Neg liver ROS, GERD  ,  Endo/Other    Renal/GU Renal disease     Musculoskeletal  (+) Arthritis ,   Abdominal   Peds  Hematology  (+) anemia ,   Anesthesia Other Findings   Reproductive/Obstetrics                            Anesthesia Physical Anesthesia Plan  ASA: III  Anesthesia Plan: General   Post-op Pain Management:    Induction: Intravenous  Airway Management Planned: Oral ETT  Additional Equipment:   Intra-op Plan:   Post-operative Plan: Possible Post-op intubation/ventilation  Informed Consent: I have reviewed the patients History and Physical, chart, labs and discussed the procedure including the risks, benefits and alternatives for the proposed anesthesia with the patient or authorized representative who has indicated his/her understanding and acceptance.   Dental advisory given  Plan Discussed with: CRNA, Anesthesiologist and Surgeon  Anesthesia Plan Comments:         Anesthesia Quick Evaluation

## 2016-10-29 NOTE — Progress Notes (Signed)
PT Cancellation Note  Patient Details Name: [name] MRN: [id number] DOB: [birth year]   Cancelled Treatment:      Reason Eval/Treat Not Completed: Patient at procedure or test/ unavailable.  Will reattempt   Duncan Dull 10/29/2016, 3:42 PM Alben Deeds, [place] DPT  [phone]

## 2016-10-29 NOTE — Discharge Summary (Signed)
[facility] Discharge Summary  Patient name: [name] Medical record number: [id number] Date of birth: 11-Nov-1946 Age: 70 y.o. Gender: female Date of Admission: 10/15/2016  Date of Discharge: 11/04/16 Admitting Physician: Javier Glazier, MD  Primary Care Provider: Steve Rattler, DO Consultants: ENT, CCM  Indication for Hospitalization: respiratory distress/stridor  Discharge Diagnoses/Problem List:  Patient Active Problem List   Diagnosis Date Noted  . 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 Laryngeal stridor   . Respiratory distress   . Acute kidney injury superimposed on chronic kidney disease ([place])   . Permanent atrial fibrillation ([place])   . Laryngeal stenosis 09/18/2016  . Elevated troponin   . SOB (shortness of breath) 08/17/2016  . Acute encephalopathy   . Esophageal abnormality   . Endotracheal tube present   . Somnolence   . Respiratory failure requiring intubation ([place]) 07/11/2016  . Persistent atrial fibrillation ([place]) 07/10/2016  . Acute respiratory failure with hypoxia ([place])   . Hypotension due to drugs   . Demand ischemia ([place])   . Chest discomfort 02/05/2016  . COPD exacerbation ([place])   . Palpitations   . Chest pain 11/25/2015  . Dyspnea on exertion   . Polymyalgia rheumatica ([place]) 04/10/2015  . Low back pain 03/28/2015  . Hypertrophic cardiomyopathy ([place]) 02/15/2015  . Eczema 09/18/2014  . Environmental allergies 09/18/2014  . Pre-diabetes 11/26/2013  . COPD, moderate ([place]) 11/01/2013  . Chronic renal failure, stage 3 (moderate) 08/28/2012  . Preventative health care 08/18/2012  . Complete heart block ([place]) 04/03/2011  . Anemia 02/17/2011  . AVM (arteriovenous malformation) 02/13/2011  . PACEMAKER-[place] 11/28/2010  . CHRONIC KIDNEY DISEASE STAGE III (MODERATE) 09/24/2010  . Acute on chronic congestive heart failure ([place]) 08/30/2010  . ARTHRITIS 07/24/2010  . Generalized anxiety disorder 07/23/2010  . Atrial  fibrillation ([place]) 07/16/2010  . Hyperlipidemia 02/11/2007  . Essential hypertension 02/11/2007  . APNEA, SLEEP 02/11/2007     Disposition: home with home health PT/OT  Discharge Condition: stable, improved  Discharge Exam:  General: elderly pleasant lady, sitting up in bedside chair, in no acute distress Cardiovascular: RRR, no MRG Respiratory: no increased work of breathing, lungs clear bilaterally, trach in place  Abdomen: soft, NT, ND +bs Extremities: warm, well perfused, compression stockings in place, +1 edema in lower extremities bilaterally  Brief Hospital Course:  [name] is a 70 year old female with a PMH of CHF, GERD, hx ETOH, AFib, CKD, angioedema with chronic upper airway narrowing after an intubation with recent laryngoscopy by ENT.  She presented to [facility] ED on 10/15/16 with worsening dyspnea and stridor requiring intubation. She was admitted to [facility] service. She had a direct laryngoscopy with ablation of subepiglottic granulation tissue on 10/16/16 and failed extubation at that time. She was successfully extubated in the OR on 10/20/16 and was transferred out of the unit to FPTS care on 10/23/16. On 10/25/16 she failed a trial off bibpap and was increasingly agitated, requiring re-intubation and transfer back to CCM's service. She was managed in the ICU until 10/28/16 where she was transferred to step down unit. She had a trach placed on 10/29/16. She was evaluated by SLP and cleared for a dysphagia diet. She had her trach replaced to cuffless trach on 11/03/16 and was discharged home on 11/04/16 with home health PT, OT, and RN to continue trach care and supportive management.  Issues for Follow Up:  1. Follow up with ENT (Dr. Erik Obey) 2 weeks after discharge 2. New diagnosis  of diabetes with A1C 6.9 in the hospital, follow up with glycemic control as outpatient  Significant Procedures:  Ablation of subepiglottic granulation tissue 10/16/16 tracheostomy placed  10/29/16  Significant Labs and Imaging:   Recent Labs Lab 10/30/16 0329 10/31/16 0357  WBC 10.6* 12.0*  HGB 10.9* 10.0*  HCT 35.9* 33.7*  PLT 199 160    Recent Labs Lab 10/31/16 0357 11/01/16 0230 11/02/16 0359 11/03/16 0224 11/04/16 0220  NA 146* 149* 143 138 140  K 3.5 3.5 3.2* 3.0* 3.3*  CL 112* 114* 110 107 105  CO2 25 28 25 23 27   GLUCOSE 167* 143* 151* 149* 106*  BUN 28* 22* 19 16 15   CREATININE 1.11* 1.15* 1.07* 1.07* 1.24*  CALCIUM 9.2 9.3 8.8* 8.8* 9.2    Results/Tests Pending at Time of Discharge: none  Discharge Medications:    Medication List    TAKE these medications   acetaminophen 325 MG tablet Commonly known as:  TYLENOL Take 325 mg by mouth daily as needed (pain).   albuterol (2.5 MG/3ML) 0.083% nebulizer solution Commonly known as:  PROVENTIL Take 3 mLs (2.5 mg total) by nebulization every 6 (six) hours as needed for wheezing or shortness of breath.   ALPRAZolam 0.5 MG tablet Commonly known as:  XANAX Take 1 tablet (0.5 mg total) by mouth at bedtime as needed for anxiety.   aspirin EC 81 MG tablet Take 81 mg by mouth every morning.   CALCIUM PO Take 1 tablet by mouth once a week.   DULCOLAX 10 MG suppository Generic drug:  bisacodyl Place 10 mg rectally daily as needed for mild constipation.   ferrous sulfate 325 (65 FE) MG tablet TAKE 1 TABLET BY MOUTH TWICE DAILY WITH MEALS TO KEEP BLOOD COUNT UP What changed:  how much to take  how to take this  when to take this  additional instructions   furosemide 20 MG tablet Commonly known as:  LASIX Take 20 mg by mouth as directed. TAKE 1 TABLET BY MOUTH AS NEEDED FOR WEIGHT GAIN (2 LBS IN 24 HRS OR 5 LBS IN 1 WEEK)   furosemide 40 MG tablet Commonly known as:  LASIX Take 40 mg by mouth daily.   nitroGLYCERIN 0.4 MG SL tablet Commonly known as:  NITROSTAT Place 1 tablet (0.4 mg total) under the tongue every 5 (five) minutes as needed for chest pain.   omeprazole 20 MG  capsule Commonly known as:  PRILOSEC Take 1 capsule (20 mg total) by mouth daily.   polyethylene glycol packet Commonly known as:  MIRALAX / GLYCOLAX Take 17 g by mouth daily.   rosuvastatin 20 MG tablet Commonly known as:  CRESTOR Take 1 tablet (20 mg total) by mouth daily.   senna 8.6 MG tablet Commonly known as:  SENOKOT Take 1 tablet by mouth at bedtime as needed for constipation.   trolamine salicylate 10 % cream Commonly known as:  ASPERCREME Apply 1 application topically 2 (two) times daily as needed for muscle pain.       Discharge Instructions: Please refer to Patient Instructions section of EMR for full details.  Patient was counseled important signs and symptoms that should prompt return to medical care, changes in medications, dietary instructions, activity restrictions, and follow up appointments.   Follow-Up Appointments: Follow-up Information    Steve Rattler, DO. Go on 11/13/2016.   Why:  at 3:30 pm Contact information: [place] [id number] [phone]        [facility] Follow up.   Why:  Home Health RN, Physial Therapy and Occupational Therapy Contact information: [address] [place] [address] [place], DO 11/05/2016, 2:20 PM PGY-1, [place]

## 2016-10-29 NOTE — Progress Notes (Signed)
Family Medicine Teaching Service Daily Progress Note Intern Pager: [phone]  Patient name: [name] Medical record number: [id number] Date of birth: 1946-12-07 Age: 70 y.o. Gender: female  Primary Care Provider: Steve Rattler, DO Consultants: CCM Code Status: FULL  Pt Overview and Major Events to Date:  10/5 direct laryngoscopy with biopsy and bronchoscopy for granulation tissue 11/1 Admit to CCM, intubated 11/2 ablation of subepiglottic granulation tissue, failed extubation in OR, re-intubated 11/6 extubated in OR  11/9 CCM>FMTS 11/11 FMTS>CCM  11/14 CCM>FMTS  Assessment and Plan: 70 yo female with PMH of CHF, GERD, hx ETOH, AFib, CKD, angioedema with chronic upper airway narrowing after an intubation with recent laryngoscopy by ENT with removal of granulation tissue 3 weeks prior to admit but has continued to have dyspnea and stridor.    #Acute hypoxic respiratory failure 2/2 to stridor from laryngeal stenosis.  On RA however with slowly increasing stridor and slightly labored breathing.  S/p ablation of subepiglottic granulation tissue 11/2.  ENT following.  Plan for ENT to place tracheostomy with direct laryngoscopy this afternoon (Dr. Erik Obey).   - SLP recommended dysphagia 1 diet of pureed solids via spoon .  NPO till surgery.   - continue Decadron 4mg  q6h   - PT+OT - recc HH PT 24 h supervision  - Patient doing well on RA, satting 100%.  Continue to monitor in ICU.    #encephalopathy likely 2/2 to delirium.  Resolved.  Patient AAOx3 this AM with son at bedside, stated she did well overnight and has not been agitated  -continue to monitor   #Recurrent chest pain 11/7. Resolved.  Troponins downtrending 11/1-11/7.  Right heart cath and left heart cath on 08/19/2016 with normal coronary arteries, severe diastolic dysfunction, normal LV.   -continue to monitor   #Afib, rate controlled. CHAD-VAsc score of 4.  Patient not on anticoagulation given history of GI  bleeding. Pacemaker in place. -Continue Aspirin 81 mg daily -monitor on tele  #Hypernatremia.  On admission Na+ 140.  This AM 152>154>152>145.   -Monitor BMET -Replace electrolytes as needed  #CKD III.  Creatinine this AM 1.08. Baseline ~1.3.  BUN uptrended to 73 likely due to Lasix. -AM BMET  -Avoid nephrotoxic meds   #Leukocytosis.  Resolved, WBC 8.9. Patient has been afebrile.   -Daily CBCs  #Hypertension. Stable. Only on Lasix 20mg  at home. BP 131/97 this morning.  -2.5 mg IV Metoprolol Q6 h  -Hydralazine prn  #COPD, moderate. Albuterol prn at home.  -Albuterol neb q6hrs prn  #HLD.  - Continue home Crestor 20mg  daily  #CHF. Stable.  On Lasix 20mg  at home. -Coreg 6.25mg  bid started this admission -Daily weights  #Type 2 Diabetes, new diagnosis: A1c 6.9% on 11/1. Not on any diabetes meds at home. -Had a discussion with patient regarding new diagnosis of diabetes -Sensitive SSI   FEN/GI: NPO PPx:  SCDs, Heparin   Disposition: Continue to monitor.  Dispo pending clinical improvement.    Subjective:  Patient alert and conversant this morning , not agitated overnight.  No concerns at this time. Plan for OR this afternoon for tracheostomy.  Daughter and son at bedside and updated.   Objective: Temp:  [97.6 F (36.4 C)-98.1 F (36.7 C)] 97.7 F (36.5 C) (11/15 0753) Pulse Rate:  [69-78] 78 (11/15 0800) Resp:  [14-23] 14 (11/15 0800) BP: (102-146)/(60-114) 133/99 (11/15 0800) SpO2:  [77 %-100 %] 100 % (11/15 0800) Weight:  [152 lb 8.9 oz (69.2 kg)] 152 lb 8.9 oz (69.2 kg) (11/15  0500)   Physical Exam: General: awake, alert, in NAD, son and daughter at bedside  Cardiovascular: RRR, no MRG Respiratory: stridor present, mild increased WOB Abdomen: soft, NT, ND +bs Extremities: warm, well perfused, no edema Psych: mood appropriate  Laboratory:  Recent Labs Lab 10/25/16 0329 10/26/16 0248 10/29/16 0219  WBC 8.7 6.1 8.9  HGB 10.4* 10.5* 10.3*  HCT  36.9 35.2* 34.3*  PLT 306 248 232    Recent Labs Lab 10/27/16 0213 10/28/16 0212 10/29/16 0219  NA 146* 145 145  K 3.6 3.4* 4.2  CL 110 109 109  CO2 30 29 29   BUN 33* 41* 43*  CREATININE 1.13* 1.08* 1.23*  CALCIUM 9.6 9.2 9.6  GLUCOSE 149* 180* 152*   Imaging/Diagnostic Tests: No results found. Lovenia Kim, MD 10/29/2016, 9:19 AM PGY-1, [place] Intern pager: [phone], text pages welcome

## 2016-10-29 NOTE — Progress Notes (Signed)
Site was soiled, bloody gauze with oozing secretions wet pink tinged secretions noted. Trach site cleansed with NS, dried and clean dressing applied. No distress noted. Pt is stable at this time sats are 97% on 21% RA humidity.

## 2016-10-29 NOTE — Transfer of Care (Signed)
Immediate Anesthesia Transfer of Care Note  Patient: [name]  Procedure(s) Performed: Procedure(s): TRACHEOSTOMY (N/A) DIRECT LARYNGOSCOPY (N/A)  Patient Location: PACU  Anesthesia Type:General  Level of Consciousness: awake and alert   Airway & Oxygen Therapy: Patient Spontanous Breathing and Patient connected to tracheostomy mask oxygen  Post-op Assessment: Report given to RN, Post -op Vital signs reviewed and stable and Patient moving all extremities X 4  Post vital signs: Reviewed and stable  Last Vitals:  Vitals:   10/29/16 0900 10/29/16 1019  BP: (!) 133/120 (!) 123/92  Pulse: 79 73  Resp: 17 16  Temp:  36.3 C    Last Pain:  Vitals:   10/29/16 1019  TempSrc: Oral  PainSc:          Complications: No apparent anesthesia complications

## 2016-10-29 NOTE — Progress Notes (Signed)
OT Cancellation Note  Patient Details Name: [name] MRN: [id number] DOB: 09-29-1946   Cancelled Treatment:    Reason Eval/Treat Not Completed: Patient at procedure or test/ unavailable.  Will reattempt.  [place], OTR/L [phone]   Lucille Passy M 10/29/2016, 2:10 PM

## 2016-10-29 NOTE — Anesthesia Postprocedure Evaluation (Signed)
Anesthesia Post Note  Patient: [name]  Procedure(s) Performed: Procedure(s) (LRB): TRACHEOSTOMY (N/A) DIRECT LARYNGOSCOPY (N/A)  Patient location during evaluation: PACU Anesthesia Type: General Level of consciousness: awake Pain management: pain level controlled Respiratory status: patient connected to tracheostomy mask oxygen Anesthetic complications: no    Last Vitals:  Vitals:   10/29/16 1615 10/29/16 1630  BP: 121/73 130/83  Pulse: 75 75  Resp: 15 14  Temp:      Last Pain:  Vitals:   10/29/16 1615  TempSrc:   PainSc: Asleep                 EDWARDS,Jet Armbrust

## 2016-10-29 NOTE — Anesthesia Procedure Notes (Signed)
Procedure Name: Intubation Date/Time: 10/29/2016 2:23 PM Performed by: Rejeana Brock L Pre-anesthesia Checklist: Patient identified, Emergency Drugs available, Suction available and Patient being monitored Patient Re-evaluated:Patient Re-evaluated prior to inductionOxygen Delivery Method: Circle System Utilized Preoxygenation: Pre-oxygenation with 100% oxygen Intubation Type: IV induction Ventilation: Mask ventilation without difficulty Laryngoscope Size: Glidescope and 4 Grade View: Grade I Tube type: Oral Tube size: 6.5 mm Number of attempts: 1 Airway Equipment and Method: Stylet and Rigid stylet Placement Confirmation: ETT inserted through vocal cords under direct vision,  positive ETCO2 and breath sounds checked- equal and bilateral Secured at: 21 cm Tube secured with: Tape Dental Injury: Teeth and Oropharynx as per pre-operative assessment

## 2016-10-30 ENCOUNTER — Encounter (HOSPITAL_COMMUNITY): Payer: Self-pay | Admitting: Otolaryngology

## 2016-10-30 LAB — BASIC METABOLIC PANEL
ANION GAP: 4 — AB (ref 5–15)
BUN: 39 mg/dL — ABNORMAL HIGH (ref 6–20)
CO2: 32 mmol/L (ref 22–32)
Calcium: 9.5 mg/dL (ref 8.9–10.3)
Chloride: 110 mmol/L (ref 101–111)
Creatinine, Ser: 1.25 mg/dL — ABNORMAL HIGH (ref 0.44–1.00)
GFR calc Af Amer: 49 mL/min — ABNORMAL LOW (ref >60)
GFR, EST NON AFRICAN AMERICAN: 42 mL/min — AB (ref >60)
GLUCOSE: 132 mg/dL — AB (ref 65–99)
POTASSIUM: 3.7 mmol/L (ref 3.5–5.1)
Sodium: 146 mmol/L — ABNORMAL HIGH (ref 135–145)

## 2016-10-30 LAB — GLUCOSE, CAPILLARY
GLUCOSE-CAPILLARY: 132 mg/dL — AB (ref 65–99)
GLUCOSE-CAPILLARY: 161 mg/dL — AB (ref 65–99)
Glucose-Capillary: 144 mg/dL — ABNORMAL HIGH (ref 65–99)
Glucose-Capillary: 130 mg/dL — ABNORMAL HIGH (ref 65–99)

## 2016-10-30 LAB — CBC
HEMATOCRIT: 35.9 % — AB (ref 36.0–46.0)
Hemoglobin: 10.9 g/dL — ABNORMAL LOW (ref 12.0–15.0)
MCH: 26.5 pg (ref 26.0–34.0)
MCHC: 30.4 g/dL (ref 30.0–36.0)
MCV: 87.3 fL (ref 78.0–100.0)
PLATELETS: 199 10*3/uL (ref 150–400)
RBC: 4.11 MIL/uL (ref 3.87–5.11)
RDW: 15.8 % — ABNORMAL HIGH (ref 11.5–15.5)
WBC: 10.6 10*3/uL — AB (ref 4.0–10.5)

## 2016-10-30 MED ORDER — DEXTROSE-NACL 5-0.9 % IV SOLN
INTRAVENOUS | Status: DC
  Administered 2016-10-30 – 2016-10-31 (×2): via INTRAVENOUS

## 2016-10-30 MED ORDER — ONDANSETRON HCL 4 MG/2ML IJ SOLN: 4.0000 mg | Freq: Three times a day (TID) | INTRAMUSCULAR | Status: DC | PRN

## 2016-10-30 MED ORDER — PANTOPRAZOLE SODIUM 40 MG IV SOLR
40.0000 mg | INTRAVENOUS | Status: DC
  Administered 2016-10-30 – 2016-11-01 (×3): 40 mg via INTRAVENOUS
  Filled 2016-10-30 (×3): qty 40

## 2016-10-30 NOTE — Progress Notes (Signed)
New ATC set up Room Air humidity

## 2016-10-30 NOTE — Progress Notes (Signed)
10/30/2016 11:32 AM  [place], [name]  Post-Op Day 1    Temp:  [97.6 F (36.4 C)-98.2 F (36.8 C)] 98.2 F (36.8 C) (11/16 0816) Pulse Rate:  [72-75] 72 (11/16 1000) Resp:  [11-16] 16 (11/16 1000) BP: (90-132)/(56-83) 106/75 (11/16 1000) SpO2:  [95 %-100 %] 96 % (11/16 1000) FiO2 (%):  [21 %-28 %] 21 % (11/16 0752) Weight:  [69.9 kg (154 lb 1.6 oz)] 69.9 kg (154 lb 1.6 oz) (11/16 0411),     Intake/Output Summary (Last 24 hours) at 10/30/16 1132 Last data filed at 10/30/16 0900  Gross per 24 hour  Intake          1383.33 ml  Output              430 ml  Net           953.33 ml    Results for orders placed or performed during the hospital encounter of 10/15/16 (from the past 24 hour(s))  Glucose, capillary     Status: Abnormal   Collection Time: 10/29/16 12:58 PM  Result Value Ref Range   Glucose-Capillary 124 (H) 65 - 99 mg/dL  Glucose, capillary     Status: Abnormal   Collection Time: 10/29/16  9:54 PM  Result Value Ref Range   Glucose-Capillary 110 (H) 65 - 99 mg/dL   Comment 1 Notify RN   CBC     Status: Abnormal   Collection Time: 10/30/16  3:29 AM  Result Value Ref Range   WBC 10.6 (H) 4.0 - 10.5 K/uL   RBC 4.11 3.87 - 5.11 MIL/uL   Hemoglobin 10.9 (L) 12.0 - 15.0 g/dL   HCT 35.9 (L) 36.0 - 46.0 %   MCV 87.3 78.0 - 100.0 fL   MCH 26.5 26.0 - 34.0 pg   MCHC 30.4 30.0 - 36.0 g/dL   RDW 15.8 (H) 11.5 - 15.5 %   Platelets 199 150 - 400 K/uL  Basic metabolic panel     Status: Abnormal   Collection Time: 10/30/16  3:29 AM  Result Value Ref Range   Sodium 146 (H) 135 - 145 mmol/L   Potassium 3.7 3.5 - 5.1 mmol/L   Chloride 110 101 - 111 mmol/L   CO2 32 22 - 32 mmol/L   Glucose, Bld 132 (H) 65 - 99 mg/dL   BUN 39 (H) 6 - 20 mg/dL   Creatinine, Ser 1.25 (H) 0.44 - 1.00 mg/dL   Calcium 9.5 8.9 - 10.3 mg/dL   GFR calc non Af Amer 42 (L) >60 mL/min   GFR calc Af Amer 49 (L) >60 mL/min   Anion gap 4 (L) 5 - 15  Glucose, capillary     Status:  Abnormal   Collection Time: 10/30/16  8:14 AM  Result Value Ref Range   Glucose-Capillary 144 (H) 65 - 99 mg/dL    SUBJECTIVE:  Some pain at surgical site.  Breathing OK  OBJECTIVE:  Trach sl bloody but secure.  IMPRESSION:  Satisfactory check  PLAN:  Trach cuff down.  Speech assist with PMV. I will plan to place a cuffless trach early next week.  Home Health teaching and preparations.  [name]

## 2016-10-30 NOTE — Progress Notes (Signed)
Physical Therapy Treatment Patient Details Name: [name] MRN: [id number] DOB: 06-Jul-1946 Today's Date: 11/27/16    History of Present Illness 70 yo female with multiple medical problems including dCHF, GERD, hx ETOH, AFib, CKD, angioedema with chronic upper airway narrowing after an intubation with recent laryngoscopy by ENT with removal of granulation tissue 3 weeks prior to admit but has continued to have dyspnea and stridor.  She presented 11/1 to ER with ongoing dyspnea and stridor. Pt was intubated 11/2 and extubated 11/6. Trach placed 10/29/16.    PT Comments    Pt doing well s/p trach. Continue to work toward incr activity tolerance and incr independence with mobility.  Follow Up Recommendations  Home health PT;Supervision/Assistance - 24 hour     Equipment Recommendations  None recommended by PT    Recommendations for Other Services       Precautions / Restrictions Precautions Precautions: Fall Restrictions Weight Bearing Restrictions: No    Mobility  Bed Mobility               General bed mobility comments: Pt up in chair  Transfers Overall transfer level: Needs assistance Equipment used: Rolling walker (2 wheeled) Transfers: Sit to/from Stand Sit to Stand: Min assist         General transfer comment: Assist to bring hips up from chair.  Ambulation/Gait Ambulation/Gait assistance: Min guard Ambulation Distance (Feet): 120 Feet Assistive device: 4-wheeled walker Gait Pattern/deviations: Step-through pattern;Decreased stride length;Trunk flexed Gait velocity: decr Gait velocity interpretation: Below normal speed for age/gender General Gait Details: Assist for safety. Pt amb on RA with SpO2 98-100%.   Stairs            Wheelchair Mobility    Modified Rankin (Stroke Patients Only)       Balance Overall balance assessment: Needs assistance Sitting-balance support: No upper extremity supported;Feet supported Sitting  balance-Leahy Scale: Fair     Standing balance support: Bilateral upper extremity supported Standing balance-Leahy Scale: Poor Standing balance comment: rollator and supervision                    Cognition Arousal/Alertness: Awake/alert Behavior During Therapy: WFL for tasks assessed/performed Overall Cognitive Status: Within Functional Limits for tasks assessed                      Exercises      General Comments        Pertinent Vitals/Pain Pain Assessment: No/denies pain    Home Living                      Prior Function            PT Goals (current goals can now be found in the care plan section) Progress towards PT goals: Progressing toward goals    Frequency    Min 3X/week      PT Plan Current plan remains appropriate    Co-evaluation             End of Session Equipment Utilized During Treatment: Gait belt Activity Tolerance: Patient tolerated treatment well Patient left: in chair;with call bell/phone within reach;with family/visitor present     Time: 3532-9924 PT Time Calculation (min) (ACUTE ONLY): 21 min  Charges:  $Gait Training: 8-22 mins                    G Codes:      [name] [facility] 2016-11-27, 2:09 PM [employer] PT  [phone]   

## 2016-10-30 NOTE — Progress Notes (Signed)
Pt c/o stomach pain & nausea. MD made aware, new orders are to be put in by MD for IV protonix and nausea medicine.

## 2016-10-30 NOTE — Progress Notes (Signed)
SLP Cancellation Note  Patient Details Name: [name] MRN: [id number] DOB: Jul 28, 1946   Cancelled treatment:       Reason Eval/Treat Not Completed: Medical issues which prohibited therapy. Patient is less than 24 hours s/p tracheostomy, fresh blood at trach site per RN. Given extent of dysphagia prior to trach with potential impact further s/p tracheostomy, recommend holding pos until PMSV evaluation (potentially 11/17?) and swallow can be reassessed.   [place], CCC-SLP [phone]    [name] 10/30/2016, 9:05 AM

## 2016-10-30 NOTE — Progress Notes (Signed)
Trach care done on pt per RRT. Site was soiled, site was cleansed and new clean dressing applied. New IC inserted and locked back into place no distress noted. Pt trach site is tender and sore per pt states.

## 2016-10-30 NOTE — Care Management Important Message (Signed)
Important Message  Patient Details  Name: [name] MRN: [id number] Date of Birth: 09-06-1946   Medicare Important Message Given:  Yes    Kimarie Coor Abena 10/30/2016, 9:50 AM

## 2016-10-30 NOTE — Progress Notes (Signed)
Family Medicine Teaching Service Daily Progress Note Intern Pager: [phone]  Patient name: [name] Medical record number: [id number] Date of birth: [birth year] Age: 70 y.o. Gender: female  Primary Care Provider: Steve Rattler, DO Consultants: CCM, ENT Code Status: FULL  Pt Overview and Major Events to Date:  10/5 direct laryngoscopy with biopsy and bronchoscopy for granulation tissue 11/1 Admit to CCM, intubated 11/2 ablation of subepiglottic granulation tissue, failed extubation in OR, re-intubated 11/6 extubated in OR  11/9 CCM>FMTS 11/11 FMTS>CCM  11/14 CCM>FMTS 11/15 Trach placed  Assessment and Plan: 70 yo female with PMH of CHF, GERD, hx ETOH, AFib, CKD, angioedema with chronic upper airway narrowing after an intubation with recent laryngoscopy by ENT with removal of granulation tissue 3 weeks prior to admit but has continued to have dyspnea and stridor.    #Acute hypoxic respiratory failure 2/2 to stridor from laryngeal stenosis.  On RA however with slowly increasing stridor and slightly labored breathing.  S/p ablation of subepiglottic granulation tissue 11/2.  ENT following.  Tracheostomy with direct laryngoscopy placed on 73/71 by Dr. Erik Obey.   -PMVS/swallow study per SLP tomorrow 11/17 -plan for cuffless trach per ENT 11/20 or 11/21 -PT+OT - recommending HH PT 24 h supervision after d/c  #encephalopathy likely 2/2 to delirium.  Resolved.  Patient AAOx3 this AM -continue to monitor   #Recurrent chest pain Resolved. Troponins downtrending 11/1-11/7.  Right heart cath and left heart cath on 08/19/2016 with normal coronary arteries, severe diastolic dysfunction, normal LV.   -continue to monitor   #Afib, rate controlled. CHAD-VAsc score of 4.  Patient not on anticoagulation given history of GI bleeding. Pacemaker in place. -Continue Aspirin 81 mg daily, hold today due to NPO -continue to monitor on tele  #Hypernatremia.  Resolved. On admission Na+  140 -Monitor BMET -Replace electrolytes as needed  #CKD III.  Creatinine this AM 1.25. Baseline ~1.3. -Avoid nephrotoxic meds   #Leukocytosis. WBC  10.6. Patient has been afebrile.   -Daily CBCs  #Hypertension. Stable. BP 108/71 this morning.  -holding home Lasix -2.5 mg IV Metoprolol Q6 h  -Hydralazine prn  #COPD, moderate. Albuterol prn at home.  -Albuterol neb q6hrs prn  #HLD.  - Hold home Crestor 20mg  today due to patient NPO  #CHF. Stable. On Lasix 20mg  at home, held this admission -Daily weights  #Type 2 Diabetes, new diagnosis: A1c 6.9% on 11/1. Not on any diabetes meds at home. Sugars running 110-150's -CBG AC/HS -Sensitive SSI   FEN/GI: NPO per SLP, PMSV eval possibly 11/17 PPx:  SCDs, Heparin   Disposition: Continue to monitor.  Dispo pending clinical improvement, will likely need SNF    Subjective:  Ms. Delcid is doing well this morning, mostly communicating by nodding or shaking head. She is adjusting to the trach. Denies pain anywhere.   Objective: Temp:  [97.3 F (36.3 C)-98.2 F (36.8 C)] 98.2 F (36.8 C) (11/16 0816) Pulse Rate:  [73-75] 75 (11/16 0816) Resp:  [11-16] 15 (11/16 0816) BP: (90-132)/(56-92) 108/71 (11/16 0816) SpO2:  [95 %-100 %] 98 % (11/16 0816) FiO2 (%):  [21 %-28 %] 21 % (11/16 0752) Weight:  [154 lb 1.6 oz (69.9 kg)] 154 lb 1.6 oz (69.9 kg) (11/16 0411)   Physical Exam: General: sitting up in bedside chair Cardiovascular: RRR, no MRG Respiratory: no increased work of breathing, lungs clear bilaterally. Trach in place Abdomen: soft, NT, ND +bs Extremities: warm, well perfused, no edema  Laboratory:  Recent Labs Lab 10/26/16 0248 10/29/16 0219 10/30/16  0329  WBC 6.1 8.9 10.6*  HGB 10.5* 10.3* 10.9*  HCT 35.2* 34.3* 35.9*  PLT 248 232 199    Recent Labs Lab 10/28/16 0212 10/29/16 0219 10/30/16 0329  NA 145 145 146*  K 3.4* 4.2 3.7  CL 109 109 110  CO2 29 29 32  BUN 41* 43* 39*  CREATININE  1.08* 1.23* 1.25*  CALCIUM 9.2 9.6 9.5  GLUCOSE 180* 152* 132*   Imaging/Diagnostic Tests:  No results found.   Steve Rattler, DO 10/30/2016, 9:12 AM PGY-1, [place] Intern pager: [phone], text pages welcome

## 2016-10-31 ENCOUNTER — Ambulatory Visit: Payer: Medicare Other | Admitting: Cardiovascular Disease

## 2016-10-31 LAB — GLUCOSE, CAPILLARY
GLUCOSE-CAPILLARY: 150 mg/dL — AB (ref 65–99)
GLUCOSE-CAPILLARY: 98 mg/dL (ref 65–99)
Glucose-Capillary: 144 mg/dL — ABNORMAL HIGH (ref 65–99)
Glucose-Capillary: 117 mg/dL — ABNORMAL HIGH (ref 65–99)

## 2016-10-31 LAB — BASIC METABOLIC PANEL
ANION GAP: 9 (ref 5–15)
BUN: 28 mg/dL — ABNORMAL HIGH (ref 6–20)
CALCIUM: 9.2 mg/dL (ref 8.9–10.3)
CHLORIDE: 112 mmol/L — AB (ref 101–111)
CO2: 25 mmol/L (ref 22–32)
Creatinine, Ser: 1.11 mg/dL — ABNORMAL HIGH (ref 0.44–1.00)
GFR calc non Af Amer: 48 mL/min — ABNORMAL LOW (ref >60)
GFR, EST AFRICAN AMERICAN: 56 mL/min — AB (ref >60)
GLUCOSE: 167 mg/dL — AB (ref 65–99)
POTASSIUM: 3.5 mmol/L (ref 3.5–5.1)
Sodium: 146 mmol/L — ABNORMAL HIGH (ref 135–145)

## 2016-10-31 LAB — CBC
HEMATOCRIT: 33.7 % — AB (ref 36.0–46.0)
HEMOGLOBIN: 10 g/dL — AB (ref 12.0–15.0)
MCH: 25.8 pg — AB (ref 26.0–34.0)
MCHC: 29.7 g/dL — AB (ref 30.0–36.0)
MCV: 87.1 fL (ref 78.0–100.0)
Platelets: 160 10*3/uL (ref 150–400)
RBC: 3.87 MIL/uL (ref 3.87–5.11)
RDW: 16.3 % — ABNORMAL HIGH (ref 11.5–15.5)
WBC: 12 10*3/uL — ABNORMAL HIGH (ref 4.0–10.5)

## 2016-10-31 MED ORDER — METOPROLOL TARTRATE 5 MG/5ML IV SOLN: 2.5000 mg | Freq: Four times a day (QID) | INTRAVENOUS | Status: DC

## 2016-10-31 MED ORDER — DEXTROSE-NACL 5-0.45 % IV SOLN
INTRAVENOUS | Status: DC
  Administered 2016-10-31: 10:00:00 via INTRAVENOUS
  Administered 2016-11-01: 125 mL/h via INTRAVENOUS
  Administered 2016-11-01: 20:00:00 via INTRAVENOUS

## 2016-10-31 NOTE — Progress Notes (Signed)
Changed trach ties. Pt tolerated well.

## 2016-10-31 NOTE — Evaluation (Signed)
Passy-Muir Speaking Valve - Evaluation Patient Details  Name: [name] MRN: [id number] Date of Birth: [birth year]  Today's Date: 10/31/2016 Time: 0086-7619 SLP Time Calculation (min) (ACUTE ONLY): 41 min  Past Medical History:  Past Medical History:  Diagnosis Date  . Anemia   . Angina   . Angioedema    2/2 ACE  . Arteriovenous malformation of stomach   . Arthritis   . Asthma   . AVM (arteriovenous malformation) of colon    small intestine; stomach  . Blood transfusion   . CHF (congestive heart failure) ([place])   . Complete heart block ([place])    s/p PPM 1998  . Complication of anesthesia    Difficult airway; anaphylaxis and swelling with propofol  . DDD (degenerative disc disease)   . Depression   . Diastolic heart failure   . Fatty liver 07/26/10  . GERD (gastroesophageal reflux disease)   . GI bleed   . Headache   . History of alcohol abuse Stopped Fall 2012  . History of tobacco use Quit Fall 2012  . Hx of cardiovascular stress test    a. Lexiscan Myoview (10/15):  Small inferolateral and apical defect c/w scar and poss soft tissue attenuation, no ischemia, EF 42%  . Hx of colonic polyp 08/13/10   adenomatous  . Hx of colonoscopy   . Hyperlipidemia   . Hyperlipidemia   . Hypertension   . Hypertrophic cardiomyopathy ([place])    dx by Dr [name] 2009  . Iron deficiency anemia   . Panic attack   . Panic attacks   . Permanent atrial fibrillation ([place])   . Pneumonia   . Renal failure    baseline creatinine 1.6  . Right arm pain 01/08/2012  . RLS (restless legs syndrome)    Dx 06/2007  . Shortness of breath    sob on exertation  . Sleep apnea    Past Surgical History:  Past Surgical History:  Procedure Laterality Date  . CARDIAC CATHETERIZATION    . CARDIAC CATHETERIZATION N/A 08/19/2016   Procedure: Right/Left Heart Cath and Coronary Angiography;  Surgeon: Jolaine Artist, MD;  Location: [facility];  Service: Cardiovascular;  Laterality: N/A;   . DIRECT LARYNGOSCOPY N/A 09/18/2016   Procedure: DIRECT LARYNGOSCOPY, BRONCHOSCOPY, REMOVAL OF INTUBATION GRANULOMA;  Surgeon: [name], MD;  Location: [facility];  Service: ENT;  Laterality: N/A;  . DIRECT LARYNGOSCOPY N/A 10/20/2016   Procedure: EXTUBATION AND FLEXIBLE LARYNGOSCOPE;  Surgeon: [name], MD;  Location: [facility];  Service: ENT;  Laterality: N/A;  . DIRECT LARYNGOSCOPY N/A 10/29/2016   Procedure: DIRECT LARYNGOSCOPY;  Surgeon: [name], MD;  Location: [facility] OR;  Service: ENT;  Laterality: N/A;  . ESOPHAGOGASTRODUODENOSCOPY  12/23/2011   Procedure: ESOPHAGOGASTRODUODENOSCOPY (EGD);  Surgeon: [name], MD;  Location: [facility];  Service: Endoscopy;  Laterality: N/A;  . EXTUBATION (ENDOTRACHEAL) IN OR N/A 07/21/2016   Procedure: EXTUBATION (ENDOTRACHEAL) IN OR;  Surgeon: [name], MD;  Location: [facility];  Service: ENT;  Laterality: N/A;  . GIVENS CAPSULE STUDY  12/23/2011   Procedure: GIVENS CAPSULE STUDY;  Surgeon: [name], MD;  Location: [facility];  Service: Endoscopy;  Laterality: N/A;  . MICROLARYNGOSCOPY WITH LASER N/A 10/16/2016   Procedure: MICRODIRECTLARYNGOSCOPY WITH LASER ABLATION AND KENLOG INJECTION;  Surgeon: [name], MD;  Location: [facility];  Service: ENT;  Laterality: N/A;  . PACEMAKER INSERTION  1998   st jude, most recent gen change by [name] 4/12  . TRACHEOSTOMY TUBE  PLACEMENT N/A 10/29/2016   Procedure: TRACHEOSTOMY;  Surgeon: [name], MD;  Location: [place] [facility];  Service: ENT;  Laterality: N/A;  . TUBAL LIGATION  04/01/2000   HPI:  70 yo female with multiple medical problems including dCHF, GERD, hx ETOH, AFib, CKD, angioedema with chronic upper airway narrowing after an intubation with recent laryngoscopy by ENT with removal of granulation tissue 3 weeks prior to admit but has continued to have dyspnea and stridor. She presented 11/1 to ER with ongoing dyspnea and stridor. She initially improved with steroids, BD and ativan and was seen by ENT  who scheduled direct laryngoscopy for 11/2 but wanted cardiology clearance first r/t troponin 0.57. Over time she became increasingly stridorous, tripoding, diaphoretic and required intubation in ER. Pt was intubated 11/2-11/6. Two MBS studies (11/10 and 11/13) revealed moderate-severe oropharyngeal dysphagia with silent aspiration, diffuse residue, and recs for conservative diet of dysphagia 1 with pudding-thick liquids.  Pt subsequently underwent tracheotomy on 11/15 due to increasing stridor and labored breathing.    Assessment / Plan / Recommendation Clinical Impression  Pt presented with excellent toleration of PMV.  Cuff deflated at baseline (plan is to change to cuffless in several days).  Upper airway is sufficiently patent for use of valve, with no signs of air trapping.  Pt's vital signs remained stable throughout use.  She was able to achieve low volume, hoarse voicing which improved to baseline by end of session.  She expectorated secretions orally.  Pt and son were shown pictures depicting tracheostomy; we reviewed its function, the function of the PMV, and they were taught how to place, remove, and clean valve.  Recommend that pt use valve all waking hours; remove with sleep.  Regarding swallow, plan will be to repeat MBS next date.  D/W pt, son, Therapist, sports.     SLP Assessment  Patient needs continued Speech Language Pathology Services    Follow Up Recommendations  Home health SLP  Repeat MBS next date   Frequency and Duration min 3x week  2 weeks    PMSV Trial PMSV was placed for: 30 minutes Able to redirect subglottic air through upper airway: Yes Able to Attain Phonation: Yes Voice Quality: Hoarse;Low vocal intensity Able to Expectorate Secretions: Yes Level of Secretion Expectoration with PMSV: Oral Breath Support for Phonation: Adequate Intelligibility: Intelligible Respirations During Trial: 22 SpO2 During Trial: 96 % Pulse During Trial: 76 Behavior:  Alert;Controlled;Cooperative   Tracheostomy Tube  Additional Tracheostomy Tube Assessment Fenestrated: No  #4 cuff deflated  Vent Dependency  Vent Dependent: No    Cuff Deflation Trial  GO Tolerated Cuff Deflation: Yes        Assunta Curtis 10/31/2016, 1:27 PM

## 2016-10-31 NOTE — Progress Notes (Signed)
MD paged about pt dietary needs due to NPO status.

## 2016-10-31 NOTE — Progress Notes (Signed)
Family Medicine Teaching Service Daily Progress Note Intern Pager: [phone]  Patient name: [name] Medical record number: [id number] Date of birth: [birth year] Age: 70 y.o. Gender: female  Primary Care Provider: Steve Rattler, DO Consultants: CCM, ENT Code Status: FULL  Pt Overview and Major Events to Date:  10/5 direct laryngoscopy with biopsy and bronchoscopy for granulation tissue 11/1 Admit to CCM, intubated 11/2 ablation of subepiglottic granulation tissue, failed extubation in OR, re-intubated 11/6 extubated in OR  11/9 CCM>FMTS 11/11 FMTS>CCM  11/14 CCM>FMTS 11/15 Trach placed  Assessment and Plan: 70 yo female with PMH of CHF, GERD, hx ETOH, AFib, CKD, angioedema with chronic upper airway narrowing after an intubation with recent laryngoscopy by ENT with removal of granulation tissue 3 weeks prior to admit but had continued to have dyspnea and stridor.    #Acute hypoxic respiratory failure 2/2 to stridor from laryngeal stenosis. S/p ablation of subepiglottic granulation tissue 11/2.  ENT following. Tracheostomy with direct laryngoscopy placed on 48/01 by Dr. Erik Obey.   -PMVS/swallow study per SLP today -Spoke with Dr. Erik Obey, plan for cuffless trach per ENT 11/20 or 11/21 prior to discharge -PT+OT - recommending HH PT 24 h supervision after d/c  #Recurrent chest pain Resolved. Troponins downtrending 11/1-11/7.  Right heart cath and left heart cath on 08/19/2016 with normal coronary arteries, severe diastolic dysfunction, normal LV.   -continue to monitor   #Afib, rate controlled. CHAD-VAsc score of 4.  Patient not on anticoagulation given history of GI bleeding. Pacemaker in place. -on Aspirin 81 mg daily, hold today due to NPO -continue to monitor on tele  #Hypernatremia.  Sodium today 146 -Monitor BMET daily -Replace electrolytes as needed -reduce fluids to half NS today  #CKD III.  Creatinine this AM 1.25. Baseline ~1.3. -Avoid nephrotoxic meds    #Leukocytosis. WBC tending up from 10.6 to 12.0 today. Patient continues to be afebrile. No need to follow CBCs daily. -Monitor for signs of infection  #Hypertension. Stable. BP 104/57 this morning.  -holding home Lasix -2.5 mg IV Metoprolol Q6 h  -Hydralazine prn  #COPD, moderate. Stable. Albuterol prn at home.  -Albuterol neb q6hrs prn  #HLD.  - Hold home Crestor 20mg  today due to patient NPO  #CHF. Stable. On Lasix 20mg  at home, held this admission -Daily weights  #Type 2 Diabetes, new diagnosis: A1c 6.9% on 11/1. Not on any diabetes meds at home. Sugars running 110-150's. Fasting CBG this am 161.  -CBG AC/HS -Sensitive SSI   FEN/GI: NPO per SLP, PMSV eval today. D5 1/2NS at 125/hr PPx:  SCDs, Heparin    Disposition: Continue to monitor. Dispo pending clinical improvement  Subjective:  [name] is doing well today, she is hungry and also requesting water. She endorses pain at her trach site. Denies other complaints or concerns.  Objective: Temp:  [98.2 F (36.8 C)-99.1 F (37.3 C)] 98.3 F (36.8 C) (11/17 0824) Pulse Rate:  [72-79] 75 (11/17 0912) Resp:  [11-23] 18 (11/17 0912) BP: (90-137)/(38-103) 119/70 (11/17 0824) SpO2:  [93 %-100 %] 93 % (11/17 0912) FiO2 (%):  [21 %] 21 % (11/16 2353) Weight:  [155 lb 6.8 oz (70.5 kg)] 155 lb 6.8 oz (70.5 kg) (11/17 0500)   Physical Exam: General: sitting up in bedside chair, in no acute distress Cardiovascular: RRR, no MRG Respiratory: no increased work of breathing, lungs clear bilaterally. Trach in place Abdomen: soft, NT, ND +bs Extremities: warm, well perfused, no edema  Laboratory:  Recent Labs Lab 10/29/16 0219 10/30/16 0329  10/31/16 0357  WBC 8.9 10.6* 12.0*  HGB 10.3* 10.9* 10.0*  HCT 34.3* 35.9* 33.7*  PLT 232 199 160    Recent Labs Lab 10/29/16 0219 10/30/16 0329 10/31/16 0357  NA 145 146* 146*  K 4.2 3.7 3.5  CL 109 110 112*  CO2 29 32 25  BUN 43* 39* 28*  CREATININE  1.23* 1.25* 1.11*  CALCIUM 9.6 9.5 9.2  GLUCOSE 152* 132* 167*   Imaging/Diagnostic Tests:  No results found.   Steve Rattler, DO 10/31/2016, 9:17 AM PGY-1, [place] Intern pager: [phone], text pages welcome

## 2016-10-31 NOTE — Progress Notes (Signed)
Occupational Therapy Treatment Patient Details Name: [name] MRN: [id number] DOB: [birth year] Today's Date: 10/31/2016    History of present illness 70 yo female with multiple medical problems including dCHF, GERD, hx ETOH, AFib, CKD, angioedema with chronic upper airway narrowing after an intubation with recent laryngoscopy by ENT with removal of granulation tissue 3 weeks prior to admit but has continued to have dyspnea and stridor.  She presented 11/1 to ER with ongoing dyspnea and stridor. Pt was intubated 11/2 and extubated 11/6. Trach placed 10/29/16.   OT comments  Focus of session on UB exercise with level 2 theraband. Pt fatigues easily, requiring rest break after 5 repetitions of each exercise. Pt able to repeat demonstration of exercises so she may continue performing outside of therapy. Pt continues to have some confusion with memory deficits and tangential comments. Will continue to follow.  Follow Up Recommendations  Home health OT;Supervision/Assistance - 24 hour    Equipment Recommendations  None recommended by OT    Recommendations for Other Services      Precautions / Restrictions Precautions Precautions: Fall Restrictions Weight Bearing Restrictions: No       Mobility Bed Mobility               General bed mobility comments: Pt up in chair  Transfers   Balance Overall balance assessment: Needs assistance Sitting-balance support: No upper extremity supported Sitting balance-Leahy Scale: Fair                        ADL                                                Vision                     Perception     Praxis      Cognition   Behavior During Therapy: WFL for tasks assessed/performed Overall Cognitive Status: Within Functional Limits for tasks assessed Area of Impairment: Memory     Memory: Decreased short-term memory          General Comments: some tangential conversation     Extremity/Trunk Assessment               Exercises General Exercises - Upper Extremity Shoulder Flexion: Strengthening;Both;5 reps;Seated;Theraband (x 2) Theraband Level (Shoulder Flexion): Level 2 (Red) Shoulder Extension: Strengthening;5 reps;Seated;Theraband (x2) Theraband Level (Shoulder Extension): Level 2 (Red) Shoulder Horizontal ABduction: Strengthening;Both;5 reps;Seated;Theraband (x2) Theraband Level (Shoulder Horizontal Abduction): Level 2 (Red) Elbow Flexion: Strengthening;Both;5 reps;Seated;Theraband (x2) Theraband Level (Elbow Flexion): Level 2 (Red) Elbow Extension: Strengthening;Both;5 reps;Seated;Theraband (x2) Theraband Level (Elbow Extension): Level 2 (Red)   Shoulder Instructions       General Comments      Pertinent Vitals/ Pain       Pain Assessment: No/denies pain  Home Living                                          Prior Functioning/Environment              Frequency  Min 2X/week        Progress Toward Goals  OT Goals(current goals can now be found in the care plan section)  Progress towards OT goals:  Progressing toward goals  Acute Rehab OT Goals Patient Stated Goal: to get stronger  Time For Goal Achievement: 11/10/16 Potential to Achieve Goals: Good  Plan Discharge plan remains appropriate    Co-evaluation                 End of Session     Activity Tolerance Patient limited by fatigue   Patient Left in chair;with call bell/phone within reach;with family/visitor present   Nurse Communication          Time: 6770-3403 OT Time Calculation (min): 19 min  Charges: OT General Charges $OT Visit: 1 Procedure OT Treatments $Therapeutic Exercise: 8-22 mins  Malka So 10/31/2016, 3:45 PM  [phone]

## 2016-10-31 NOTE — Progress Notes (Signed)
Nutrition Follow-up  DOCUMENTATION CODES:   Not applicable  INTERVENTION:    Diet advancement as able per SLP.   If unable to advance diet safely, recommend replace CORTRAK tube and resume TF with Jevity 1.2 at 55 ml/h with Pro-stat 30 ml once daily to provide 1684 kcal, 88 gm protein, 1069 ml free water daily.  NUTRITION DIAGNOSIS:   Inadequate oral intake related to inability to eat as evidenced by NPO status.  Ongoing  GOAL:   Patient will meet greater than or equal to 90% of their needs  Unmet  MONITOR:   Diet advancement, PO intake, Skin, I & O's, Labs, Weight trends  ASSESSMENT:   70yo female with multiple medical problems including dCHF, GERD, hx ETOH, AFib, CKD, angioedema with chronic upper airway narrowing after an intubation with recent laryngoscopy by ENT with removal of granulation tissue 3 weeks prior to admit but has continued to have dyspnea and stridor.  She presented 11/1 to ER with ongoing dyspnea and stridor.  She initially improved with steroids, BD and ativan and was seen by ENT who scheduled direct laryngoscopy for 11/2 but wanted cardiology clearance first r/t troponin 0.57.  Over time she became increasingly stridorous, tripoding, diaphoretic and required intubation in ER.   Patient was extubated 11/6.  CORTRAK feeding tube was placed 11/7, but patient pulled it out 11/10. S/p two MBS studies (11/10 and 11/13) >> advanced to Dys 1-pudding thick liquid diet. S/p tracheostomy placement 11/15. For repeat MBSS next date available. CBG's [id number].  Diet Order:   NPO  Skin:  Reviewed, no issues  Last BM:  11/15  Height:   Ht Readings from Last 1 Encounters:  10/15/16 5\' 2"  (1.575 m)    Weight:   Wt Readings from Last 1 Encounters:  10/31/16 155 lb 6.8 oz (70.5 kg)    Ideal Body Weight:  50 kg  BMI:  Body mass index is 28.43 kg/m.  Estimated Nutritional Needs:   Kcal:  1500-1700  Protein:  80-95 gm  Fluid:  2 L  EDUCATION  NEEDS:   No education needs identified at this time  Arthur Holms, RD, LDN Pager #: [phone] After-Hours Pager #: [phone]

## 2016-10-31 NOTE — Plan of Care (Signed)
Problem: Activity: Goal: Risk for activity intolerance will decrease Outcome: Progressing Pt ambulating in room well.

## 2016-10-31 NOTE — Progress Notes (Signed)
Physical Therapy Treatment Patient Details Name: Leeyah Heather MRN: [id number] DOB: March 07, 1946 Today's Date: 11-03-16    History of Present Illness 70 yo female with multiple medical problems including dCHF, GERD, hx ETOH, AFib, CKD, angioedema with chronic upper airway narrowing after an intubation with recent laryngoscopy by ENT with removal of granulation tissue 3 weeks prior to admit but has continued to have dyspnea and stridor.  She presented 11/1 to ER with ongoing dyspnea and stridor. Pt was intubated 11/2 and extubated 11/6. Trach placed 10/29/16.    PT Comments    Pt making steady progress. Fatigues quickly.  Follow Up Recommendations  Home health PT;Supervision/Assistance - 24 hour     Equipment Recommendations  None recommended by PT    Recommendations for Other Services       Precautions / Restrictions Precautions Precautions: Fall Restrictions Weight Bearing Restrictions: No    Mobility  Bed Mobility               General bed mobility comments: Pt up in chair  Transfers Overall transfer level: Needs assistance Equipment used: 4-wheeled walker Transfers: Sit to/from Stand Sit to Stand: Min assist         General transfer comment: Assist to bring hips up from chair.  Ambulation/Gait Ambulation/Gait assistance: Min guard Ambulation Distance (Feet): 130 Feet (Pt with one sitting rest break on rollator at 65'.) Assistive device: 4-wheeled walker Gait Pattern/deviations: Step-through pattern;Decreased stride length;Trunk flexed Gait velocity: decr Gait velocity interpretation: Below normal speed for age/gender General Gait Details: Assist for safety. Pt amb on RA with Passy Muir valve with SpO2 93-95%.   Stairs            Wheelchair Mobility    Modified Rankin (Stroke Patients Only)       Balance Overall balance assessment: Needs assistance Sitting-balance support: No upper extremity supported Sitting balance-Leahy Scale:  Fair     Standing balance support: Bilateral upper extremity supported Standing balance-Leahy Scale: Poor Standing balance comment: rollator and min guard for static standing                    Cognition Arousal/Alertness: Awake/alert Behavior During Therapy: WFL for tasks assessed/performed Overall Cognitive Status: Within Functional Limits for tasks assessed                      Exercises      General Comments        Pertinent Vitals/Pain Pain Assessment: No/denies pain    Home Living                      Prior Function            PT Goals (current goals can now be found in the care plan section) Progress towards PT goals: Progressing toward goals    Frequency    Min 3X/week      PT Plan Current plan remains appropriate    Co-evaluation             End of Session Equipment Utilized During Treatment: Gait belt Activity Tolerance: Patient tolerated treatment well Patient left: in chair;with call bell/phone within reach     Time: 1349-1407 PT Time Calculation (min) (ACUTE ONLY): 18 min  Charges:  $Gait Training: 8-22 mins                    G Codes:      Shary Decamp [facility] 11/03/16, 2:27 PM [employer]  [place]

## 2016-10-31 NOTE — Progress Notes (Signed)
In between rounds I noted ATC is off/on. Ask patient why not wearing ATC. States she doesn't want to wear it right now.Pt doesn't want to wear her ATC humidity. Education given to the patient in the importance of the humidity administration to prevent dry out and mucous plug off which can potential lead to a host of other problems. Nodded her head for understanding.  Pleasant woman, states that she will place it on once she is ready to wear it. RN aware. Pt is stable at this time no distress noted.

## 2016-10-31 NOTE — Progress Notes (Signed)
MD paged and made aware of pts medications being PO and not able to be taken by pt.

## 2016-11-01 ENCOUNTER — Inpatient Hospital Stay (HOSPITAL_COMMUNITY): Payer: Medicare Other

## 2016-11-01 LAB — GLUCOSE, CAPILLARY
GLUCOSE-CAPILLARY: 189 mg/dL — AB (ref 65–99)
Glucose-Capillary: 123 mg/dL — ABNORMAL HIGH (ref 65–99)
Glucose-Capillary: 134 mg/dL — ABNORMAL HIGH (ref 65–99)
Glucose-Capillary: 140 mg/dL — ABNORMAL HIGH (ref 65–99)

## 2016-11-01 LAB — BASIC METABOLIC PANEL
ANION GAP: 7 (ref 5–15)
BUN: 22 mg/dL — AB (ref 6–20)
CHLORIDE: 114 mmol/L — AB (ref 101–111)
CO2: 28 mmol/L (ref 22–32)
Calcium: 9.3 mg/dL (ref 8.9–10.3)
Creatinine, Ser: 1.15 mg/dL — ABNORMAL HIGH (ref 0.44–1.00)
GFR calc Af Amer: 54 mL/min — ABNORMAL LOW (ref >60)
GFR calc non Af Amer: 46 mL/min — ABNORMAL LOW (ref >60)
GLUCOSE: 143 mg/dL — AB (ref 65–99)
POTASSIUM: 3.5 mmol/L (ref 3.5–5.1)
Sodium: 149 mmol/L — ABNORMAL HIGH (ref 135–145)

## 2016-11-01 MED ORDER — RESOURCE THICKENUP CLEAR PO POWD: ORAL | Status: DC | PRN

## 2016-11-01 NOTE — Progress Notes (Signed)
Family Medicine Teaching Service Daily Progress Note Intern Pager: [phone]  Patient name: [name] Medical record number: [id number] Date of birth: 09-13-1946 Age: 70 y.o. Gender: female  Primary Care Provider: Steve Rattler, DO Consultants: CCM, ENT Code Status: FULL  Pt Overview and Major Events to Date:  10/5 direct laryngoscopy with biopsy and bronchoscopy for granulation tissue 11/1 Admit to CCM, intubated 11/2 ablation of subepiglottic granulation tissue, failed extubation in OR, re-intubated 11/6 extubated in OR  11/9 CCM>FMTS 11/11 FMTS>CCM  11/14 CCM>FMTS 11/15 Trach placed  Assessment and Plan: 70 yo female with PMH of CHF, GERD, hx ETOH, AFib, CKD, angioedema with chronic upper airway narrowing after an intubation with recent laryngoscopy by ENT with removal of granulation tissue 3 weeks prior to admit but had continued to have dyspnea and stridor.    #Acute Hypoxic Respiratory Failure 2/2 to Stridor from Laryngeal Stenosis, Improved:  S/p ablation of subepiglottic granulation tissue 11/2. ENT following. Tracheostomy with direct laryngoscopy placed on 50/27 by Dr. Erik Obey who plans for cuffless trach per ENT 11/20 or 11/21 prior to discharge. PT/OT recommending HH PT 24 h supervision after d/c. --PMVS/swallow study per SLP 11/18, day 2 of NPO, if unable to swallow, will consult [facility] team --Monitor respiratory status --ENT consulting, appreciate recs  #Recurrent Chest Pain, Acute, Resolved: Troponins downtrending 11/1-11/7.  Right heart cath and left heart cath on 08/19/2016 with normal coronary arteries, severe diastolic dysfunction, normal LV.   --Continue to monitor for CP --Telemetry  #Atrial Fibrillation, Chronic, Rate-Controlled: CHAD-VAsc score of 4.  Patient not on anticoagulation given history of GI bleeding. Pacemaker in place. --Aspirin 81 mg QD, hold today due to NPO --Telemetry  #Hypernatremia, Acute, Worsening:   Sodium 146>149  11/18. --Monitor BMET daily --Replace electrolytes as needed --D51/2NS @125 /hr  #Chronic Kidney Disease Stage III, Stable:  Creatinine this AM 1.15. Baseline ~1.3. --Avoid nephrotoxic meds   #Mild Leukocytosis, Acute, Stable: WBC tending up from 10.6 to 12.0 11/17. Patient continues to be afebrile. No need to follow CBCs daily. --Monitor for signs of infection  #Hypertension, Chronic, Controlled: BP 151/84 overnight.  --Holding home Lasix --Hydralazine PRN  #Moderate COPD, Chronic, Stable: Albuterol prn at home.  --Albuterol neb q6hrs prn  #HLD, Chronic, Stable: --Hold home Crestor 20 mg today due to patient NPO  #CHF, Stable: Does not appear to be in fluid overload. On Lasix 20mg  at home, held this admission. --Daily weights  #Type 2 Diabetes, New Diagnosis:  A1c 6.9% on 11/1. Not on any diabetes meds at home. Sugars running 110-150's. Fasting CBG this am 123.  --CBG AC/HS --Sensitive SSI   FEN/GI: NPO per SLP evaluation 11/17, PMSV eval today, D51/2NS @125 /hr PPx: SCDs, heparin SQ   Disposition: Continue to monitor on trach, pending wean off O2 and swallow evaluation today  Subjective:  Patient is doing well this AM. Comfortable with no dyspnea on trach. Denies chest pain, nausea, vomiting. Is hopeful to get wean off O2 over next few days.  Objective: Temp:  [97.8 F (36.6 C)-98.4 F (36.9 C)] 97.8 F (36.6 C) (11/18 0402) Pulse Rate:  [74-79] 75 (11/18 0809) Resp:  [15-20] 20 (11/18 0809) BP: (112-151)/(18-90) 137/18 (11/18 0809) SpO2:  [92 %-100 %] 98 % (11/18 0809) FiO2 (%):  [21 %] 21 % (11/18 0809) Weight:  [163 lb 9.3 oz (74.2 kg)] 163 lb 9.3 oz (74.2 kg) (11/18 7412)   Physical Exam: General: sitting up in bedside chair, in no acute distress Cardiovascular: RRR, no MRG Respiratory: no  increased work of breathing, lungs clear bilaterally, trach in place  Abdomen: soft, NT, ND +bs Extremities: warm, well perfused, no  edema  Laboratory:  Recent Labs Lab 10/29/16 0219 10/30/16 0329 10/31/16 0357  WBC 8.9 10.6* 12.0*  HGB 10.3* 10.9* 10.0*  HCT 34.3* 35.9* 33.7*  PLT 232 199 160    Recent Labs Lab 10/30/16 0329 10/31/16 0357 11/01/16 0230  NA 146* 146* 149*  K 3.7 3.5 3.5  CL 110 112* 114*  CO2 32 25 28  BUN 39* 28* 22*  CREATININE 1.25* 1.11* 1.15*  CALCIUM 9.5 9.2 9.3  GLUCOSE 132* 167* 143*   Imaging/Diagnostic Tests:  No results found.   Hilo Bing, DO 11/01/2016, 9:23 AM PGY-1, [place] Intern pager: [phone], text pages welcome

## 2016-11-01 NOTE — Progress Notes (Signed)
Pt continuously take off her trach collar. Education has been given on the importance of humidity when by-passing the upper airway to a trach. Pt states that she wears it when she gets ready. She takes it off and on throughout the night. Pt is stable at this time no distress or complications noted. Pt is currently resting sitting up in bed.

## 2016-11-02 LAB — GLUCOSE, CAPILLARY
GLUCOSE-CAPILLARY: 132 mg/dL — AB (ref 65–99)
Glucose-Capillary: 176 mg/dL — ABNORMAL HIGH (ref 65–99)
Glucose-Capillary: 138 mg/dL — ABNORMAL HIGH (ref 65–99)
Glucose-Capillary: 134 mg/dL — ABNORMAL HIGH (ref 65–99)

## 2016-11-02 LAB — BASIC METABOLIC PANEL
ANION GAP: 8 (ref 5–15)
BUN: 19 mg/dL (ref 6–20)
CALCIUM: 8.8 mg/dL — AB (ref 8.9–10.3)
CO2: 25 mmol/L (ref 22–32)
Chloride: 110 mmol/L (ref 101–111)
Creatinine, Ser: 1.07 mg/dL — ABNORMAL HIGH (ref 0.44–1.00)
GFR calc Af Amer: 59 mL/min — ABNORMAL LOW (ref >60)
GFR, EST NON AFRICAN AMERICAN: 51 mL/min — AB (ref >60)
GLUCOSE: 151 mg/dL — AB (ref 65–99)
Potassium: 3.2 mmol/L — ABNORMAL LOW (ref 3.5–5.1)
SODIUM: 143 mmol/L (ref 135–145)

## 2016-11-02 MED ORDER — POTASSIUM CHLORIDE CRYS ER 20 MEQ PO TBCR
20.0000 meq | EXTENDED_RELEASE_TABLET | Freq: Every day | ORAL | Status: DC
  Administered 2016-11-03 – 2016-11-04 (×2): 20 meq via ORAL
  Filled 2016-11-02 (×2): qty 1

## 2016-11-02 MED ORDER — POTASSIUM CHLORIDE CRYS ER 20 MEQ PO TBCR: 20.0000 meq | EXTENDED_RELEASE_TABLET | Freq: Every day | ORAL | Status: DC

## 2016-11-02 MED ORDER — PANTOPRAZOLE SODIUM 40 MG PO PACK
40.0000 mg | PACK | Freq: Every day | ORAL | Status: DC
  Administered 2016-11-02 – 2016-11-03 (×2): 40 mg
  Filled 2016-11-02: qty 20

## 2016-11-02 NOTE — Progress Notes (Signed)
Family Medicine Teaching Service Daily Progress Note Intern Pager: [phone]  Patient name: [name] Medical record number: [id number] Date of birth: 12-Aug-1946 Age: 70 y.o. Gender: female  Primary Care Provider: Steve Rattler, DO Consultants: CCM, ENT Code Status: FULL  Pt Overview and Major Events to Date:  10/5 direct laryngoscopy with biopsy and bronchoscopy for granulation tissue 11/1 Admit to CCM, intubated 11/2 ablation of subepiglottic granulation tissue, failed extubation in OR, re-intubated 11/6 extubated in OR  11/9 CCM>FMTS 11/11 FMTS>CCM  11/14 CCM>FMTS 11/15 Trach placed  Assessment and Plan: 70 yo female with PMH of CHF, GERD, hx ETOH, AFib, CKD, angioedema with chronic upper airway narrowing after an intubation with recent laryngoscopy by ENT with removal of granulation tissue 3 weeks prior to admit but had continued to have dyspnea and stridor.    #Acute Hypoxic Respiratory Failure 2/2 to Stridor from Laryngeal Stenosis, Improved:  S/p ablation of subepiglottic granulation tissue 11/2. ENT following. Tracheostomy with direct laryngoscopy placed on 26/94 by Dr. Erik [name] who plans for cuffless trach per ENT 11/20 or 11/21 prior to discharge. PT/OT recommending HH PT 24 h supervision after d/c. -passed swallow study per SLP, dysphagia diet with thick liquids -Monitor respiratory status -ENT consulting, appreciate recs  #Recurrent Chest Pain, Acute, Resolved: Troponins downtrending 11/1-11/7.  Right heart cath and left heart cath on 08/19/2016 with normal coronary arteries, severe diastolic dysfunction, normal LV.   -Continue to monitor for CP -Telemetry  #Atrial Fibrillation, Chronic, Rate-Controlled: CHAD-VAsc score of 4.  Patient not on anticoagulation given history of GI bleeding. Pacemaker in place. -holding Aspirin 81 mg daily, will add back as diet is advanced -Telemetry  #Hypernatremia, Acute, improving:  Sodium [phone] -Monitor BMET  daily -Replace electrolytes as needed -decrease D51/2NS to 75/hr  #Chronic Kidney Disease Stage III, Stable:  Creatinine this AM 1.15. Baseline ~1.3. -Avoid nephrotoxic meds   #Mild Leukocytosis, Acute, Stable: WBC tending up from 10.6 to 12.0 11/17. Patient continues to be afebrile. No need to follow CBCs daily. -Monitor for signs of infection  #Hypertension, Chronic, Controlled: BP 151/84 overnight.  -Holding home Lasix -Hydralazine PRN  #Moderate COPD, Chronic, Stable: Albuterol prn at home.  -Albuterol neb q6hrs prn  #HLD, Chronic, Stable: -Will add back home Crestor 20mg  as diet is advanced  #CHF, Stable: Does not appear to be in fluid overload. On Lasix 20mg  at home, held this admission. -Daily weights  #Type 2 Diabetes, New Diagnosis:  A1c 6.9% on 11/1. Not on any diabetes meds at home. Sugars running 110-150's. Fasting CBG this am 151.  -CBG AC/HS -Sensitive SSI   FEN/GI: advancing diet, D51/2NS @125 /hr PPx: SCDs, heparin SQ   Disposition: Continue to monitor on trach, pending wean off O2 and swallow evaluation today  Subjective:  [name] is doing very well today, she is sitting up in bed and able to talk. She still has soreness at the trach site but is overall pleased with progression.   Objective: Temp:  [97.8 F (36.6 C)-98.3 F (36.8 C)] 98.1 F (36.7 C) (11/19 0657) Pulse Rate:  [73-81] 75 (11/19 0734) Resp:  [15-22] 22 (11/19 0734) BP: (110-141)/(67-107) 138/89 (11/19 0734) SpO2:  [94 %-100 %] 99 % (11/19 0734) FiO2 (%):  [21 %-28 %] 21 % (11/19 0734) Weight:  [156 lb 4.9 oz (70.9 kg)] 156 lb 4.9 oz (70.9 kg) (11/19 0612)   Physical Exam: General: sitting up in bedside chair, in no acute distress Cardiovascular: RRR, no MRG Respiratory: no increased work of breathing, lungs  clear bilaterally, trach in place  Abdomen: soft, NT, ND +bs Extremities: warm, well perfused, no edema  Laboratory:  Recent Labs Lab 10/29/16 0219  10/30/16 0329 10/31/16 0357  WBC 8.9 10.6* 12.0*  HGB 10.3* 10.9* 10.0*  HCT 34.3* 35.9* 33.7*  PLT 232 199 160    Recent Labs Lab 10/31/16 0357 11/01/16 0230 11/02/16 0359  NA 146* 149* 143  K 3.5 3.5 3.2*  CL 112* 114* 110  CO2 25 28 25   BUN 28* 22* 19  CREATININE 1.11* 1.15* 1.07*  CALCIUM 9.2 9.3 8.8*  GLUCOSE 167* 143* 151*   Imaging/Diagnostic Tests:  Dg Swallowing Func-speech Pathology  Result Date: 11/01/2016 Objective Swallowing Evaluation: Type of Study: MBS-Modified Barium Swallow Study Patient Details Name: [name] MRN: [id number] Date of Birth: 09-Jan-1946 Today's Date: 11/01/2016 Time: SLP Start Time (ACUTE ONLY): 1145-SLP Stop Time (ACUTE ONLY): 1205 SLP Time Calculation (min) (ACUTE ONLY): 20 min Past Medical History: Past Medical History: Diagnosis Date . Anemia  . Angina  . Angioedema   2/2 ACE . Arteriovenous malformation of stomach  . Arthritis  . Asthma  . AVM (arteriovenous malformation) of colon   small intestine; stomach . Blood transfusion  . CHF (congestive heart failure) ([place])  . Complete heart block ([place])   s/p PPM 1998 . Complication of anesthesia   Difficult airway; anaphylaxis and swelling with propofol . DDD (degenerative disc disease)  . Depression  . Diastolic heart failure  . Fatty liver 07/26/10 . GERD (gastroesophageal reflux disease)  . GI bleed  . Headache  . History of alcohol abuse Stopped Fall 2012 . History of tobacco use Quit Fall 2012 . Hx of cardiovascular stress test   a. Lexiscan Myoview (10/15):  Small inferolateral and apical defect c/w scar and poss soft tissue attenuation, no ischemia, EF 42% . Hx of colonic polyp 08/13/10  adenomatous . Hx of colonoscopy  . Hyperlipidemia  . Hyperlipidemia  . Hypertension  . Hypertrophic cardiomyopathy ([place])   dx by Dr [name] 2009 . Iron deficiency anemia  . Panic attack  . Panic attacks  . Permanent atrial fibrillation ([place])  . Pneumonia  . Renal failure   baseline creatinine 1.6 . Right arm  pain 01/08/2012 . RLS (restless legs syndrome)   Dx 06/2007 . Shortness of breath   sob on exertation . Sleep apnea  Past Surgical History: Past Surgical History: Procedure Laterality Date . CARDIAC CATHETERIZATION   . CARDIAC CATHETERIZATION N/A 08/19/2016  Procedure: Right/Left Heart Cath and Coronary Angiography;  Surgeon: Jolaine Artist, MD;  Location: [place] [facility];  Service: Cardiovascular;  Laterality: N/A; . DIRECT LARYNGOSCOPY N/A 09/18/2016  Procedure: DIRECT LARYNGOSCOPY, BRONCHOSCOPY, REMOVAL OF INTUBATION GRANULOMA;  Surgeon: [name], MD;  Location: [facility] OR;  Service: ENT;  Laterality: N/A; . DIRECT LARYNGOSCOPY N/A 10/20/2016  Procedure: EXTUBATION AND FLEXIBLE LARYNGOSCOPE;  Surgeon: [name], MD;  Location: [place];  Service: ENT;  Laterality: N/A; . DIRECT LARYNGOSCOPY N/A 10/29/2016  Procedure: DIRECT LARYNGOSCOPY;  Surgeon: [name], MD;  Location: [facility] OR;  Service: ENT;  Laterality: N/A; . ESOPHAGOGASTRODUODENOSCOPY  12/23/2011  Procedure: ESOPHAGOGASTRODUODENOSCOPY (EGD);  Surgeon: [name], MD;  Location: [facility];  Service: Endoscopy;  Laterality: N/A; . EXTUBATION (ENDOTRACHEAL) IN OR N/A 07/21/2016  Procedure: EXTUBATION (ENDOTRACHEAL) IN OR;  Surgeon: [name], MD;  Location: [place];  Service: ENT;  Laterality: N/A; . GIVENS CAPSULE STUDY  12/23/2011  Procedure: GIVENS CAPSULE STUDY;  Surgeon: [name], MD;  Location: [facility];  Service: Endoscopy;  Laterality: N/A; . MICROLARYNGOSCOPY WITH LASER N/A 10/16/2016  Procedure: MICRODIRECTLARYNGOSCOPY WITH LASER ABLATION AND KENLOG INJECTION;  Surgeon: [name], MD;  Location: [place];  Service: ENT;  Laterality: N/A; . PACEMAKER INSERTION  1998  st jude, most recent gen change by [name] 4/12 . TRACHEOSTOMY TUBE PLACEMENT N/A 10/29/2016  Procedure: TRACHEOSTOMY;  Surgeon: [name], MD;  Location: [place];  Service: ENT;  Laterality: N/A; . TUBAL LIGATION  04/01/2000 HPI: 70 yo female with multiple medical problems  including dCHF, GERD, hx ETOH, AFib, CKD, angioedema with chronic upper airway narrowing after an intubation with recent laryngoscopy by ENT with removal of granulation tissue 3 weeks prior to admit but has continued to have dyspnea and stridor. She presented 11/1 to ER with ongoing dyspnea and stridor. She initially improved with steroids, BD and ativan and was seen by ENT who scheduled direct laryngoscopy for 11/2 but wanted cardiology clearance first r/t troponin 0.57. Over time she became increasingly stridorous, tripoding, diaphoretic and required intubation in ER. Pt was intubated 11/2-11/6. Two MBS studies (11/10 and 11/13) revealed moderate-severe oropharyngeal dysphagia with silent aspiration, diffuse residue, and recs for conservative diet of dysphagia 1 with pudding-thick liquids.  Pt subsequently underwent tracheotomy on 11/15 due to increasing stridor and labored breathing.  Subjective: pt alert, cooperative Assessment / Plan / Recommendation CHL IP CLINICAL IMPRESSIONS 11/01/2016 Therapy Diagnosis Moderate oral phase dysphagia;Severe pharyngeal phase dysphagia;Mild oral phase dysphagia Clinical Impression Patient presents with a mild-moderate oral and severe pharyngeal dysphagia. Despite now having a trach with PMV in place during the study, patient's swallow function appears to be somewhat improved as compared to previous two MBS's (during which she did not have trach). Patient exhibits swallow initiation delays, moderate amount of residuals of puree and regular texture solids in vallecular sinus and flash penetration with thin liquids. Chin tuck was successful to prevent flash penetration, however SLP was not confident that patient would be able to recall and consistently perform. Patient was able to clear minimal amount of vallecular sinus residuals with each cued dry swallow as well as with chin tuck,  but full clearance of vallecular sinus was not achieved. Patient remains at moderate risk of  aspiration with her dysphagia and current respiratory status. Impact on safety and function Moderate aspiration risk   CHL IP TREATMENT RECOMMENDATION 11/01/2016 Treatment Recommendations Therapy as outlined in treatment plan below   Prognosis 11/01/2016 Prognosis for Safe Diet Advancement Good Barriers to Reach Goals Cognitive deficits Barriers/Prognosis Comment -- CHL IP DIET RECOMMENDATION 11/01/2016 SLP Diet Recommendations Dysphagia 2 (Fine chop) solids;Nectar thick liquid Liquid Administration via Cup;No straw Medication Administration Crushed with puree Compensations Minimize environmental distractions;Small sips/bites;Slow rate;Multiple dry swallows after each bite/sip;Chin tuck Postural Changes Seated upright at 90 degrees   CHL IP OTHER RECOMMENDATIONS 11/01/2016 Recommended Consults -- Oral Care Recommendations Oral care BID Other Recommendations Order thickener from pharmacy;Prohibited food (jello, ice cream, thin soups);Remove water pitcher   CHL IP FOLLOW UP RECOMMENDATIONS 11/01/2016 Follow up Recommendations Home health SLP;Skilled Nursing facility;24 hour supervision/assistance;Inpatient Rehab   CHL IP FREQUENCY AND DURATION 11/01/2016 Speech Therapy Frequency (ACUTE ONLY) min 3x week Treatment Duration 2 weeks      CHL IP ORAL PHASE 11/01/2016 Oral Phase Impaired Oral - Pudding Teaspoon -- Oral - Pudding Cup -- Oral - Honey Teaspoon -- Oral - Honey Cup NT Oral - Nectar Teaspoon -- Oral - Nectar Cup WFL Oral - Nectar Straw -- Oral - Thin Teaspoon -- Oral - Thin Cup  WFL Oral - Thin Straw -- Oral - Puree Delayed oral transit Oral - Mech Soft -- Oral - Regular Weak lingual manipulation;Impaired mastication;Delayed oral transit Oral - Multi-Consistency -- Oral - Pill -- Oral Phase - Comment --  CHL IP PHARYNGEAL PHASE 11/01/2016 Pharyngeal Phase Impaired Pharyngeal- Pudding Teaspoon -- Pharyngeal -- Pharyngeal- Pudding Cup -- Pharyngeal -- Pharyngeal- Honey Teaspoon -- Pharyngeal -- Pharyngeal- Honey  Cup NT Pharyngeal -- Pharyngeal- Nectar Teaspoon NT Pharyngeal -- Pharyngeal- Nectar Cup Pharyngeal residue - valleculae;Delayed swallow initiation-vallecula Pharyngeal Material does not enter airway Pharyngeal- Nectar Straw -- Pharyngeal -- Pharyngeal- Thin Teaspoon NT Pharyngeal -- Pharyngeal- Thin Cup Delayed swallow initiation-vallecula;Delayed swallow initiation-pyriform sinuses;Penetration/Aspiration during swallow;Reduced airway/laryngeal closure;Reduced epiglottic inversion;Compensatory strategies attempted (with notebox) Pharyngeal Material enters airway, remains ABOVE vocal cords then ejected out Pharyngeal- Thin Straw -- Pharyngeal -- Pharyngeal- Puree Pharyngeal residue - valleculae;Delayed swallow initiation-vallecula;Reduced epiglottic inversion Pharyngeal -- Pharyngeal- Mechanical Soft -- Pharyngeal -- Pharyngeal- Regular Delayed swallow initiation-vallecula;Pharyngeal residue - valleculae Pharyngeal -- Pharyngeal- Multi-consistency -- Pharyngeal -- Pharyngeal- Pill -- Pharyngeal -- Pharyngeal Comment --  CHL IP CERVICAL ESOPHAGEAL PHASE 11/01/2016 Cervical Esophageal Phase WFL Pudding Teaspoon -- Pudding Cup -- Honey Teaspoon -- Honey Cup -- Nectar Teaspoon -- Nectar Cup -- Nectar Straw -- Thin Teaspoon -- Thin Cup -- Thin Straw -- Puree -- Mechanical Soft -- Regular -- Multi-consistency -- Pill -- Cervical Esophageal Comment -- No flowsheet data found.  Sonia Baller, MA, CCC-SLP 11/01/16 2:42 PM               [name] Rattler, DO 11/02/2016, 8:59 AM PGY-1, [place] Intern pager: [phone], text pages welcome

## 2016-11-02 NOTE — Progress Notes (Signed)
RT walked in to room to find patient sitting up at the edge of the bed in distress.  Patient had audible coarse crackles due to secretions that she could not clear.  Deep suctioning preformed x 2 with resistance to catheter felt.  Inner canula exchanged with immediate improvement.  Patient was not wearing trach collar at the time.  RT found patient without trach collar at the beginning of the shift as well and applied it after educating her on the importance of humidification. After patient was stabilized, she was again educated on the need to keep trach collar on while in hospital.  Patient verbalized understanding.  RT will continue to monitor.

## 2016-11-03 LAB — BASIC METABOLIC PANEL
Anion gap: 8 (ref 5–15)
BUN: 16 mg/dL (ref 6–20)
CHLORIDE: 107 mmol/L (ref 101–111)
CO2: 23 mmol/L (ref 22–32)
CREATININE: 1.07 mg/dL — AB (ref 0.44–1.00)
Calcium: 8.8 mg/dL — ABNORMAL LOW (ref 8.9–10.3)
GFR calc Af Amer: 59 mL/min — ABNORMAL LOW (ref >60)
GFR calc non Af Amer: 51 mL/min — ABNORMAL LOW (ref >60)
Glucose, Bld: 149 mg/dL — ABNORMAL HIGH (ref 65–99)
POTASSIUM: 3 mmol/L — AB (ref 3.5–5.1)
Sodium: 138 mmol/L (ref 135–145)

## 2016-11-03 LAB — GLUCOSE, CAPILLARY
GLUCOSE-CAPILLARY: 87 mg/dL (ref 65–99)
Glucose-Capillary: 143 mg/dL — ABNORMAL HIGH (ref 65–99)
Glucose-Capillary: 104 mg/dL — ABNORMAL HIGH (ref 65–99)
Glucose-Capillary: 157 mg/dL — ABNORMAL HIGH (ref 65–99)

## 2016-11-03 MED ORDER — FUROSEMIDE 10 MG/ML IJ SOLN
40.0000 mg | Freq: Once | INTRAMUSCULAR | Status: AC
  Administered 2016-11-03: 40 mg via INTRAVENOUS
  Filled 2016-11-03: qty 4

## 2016-11-03 NOTE — Progress Notes (Signed)
Speech Language Pathology Treatment: Dysphagia;Passy Muir Speaking valve  Patient Details Name: Chemika Nightengale MRN: [id number] DOB: 19-Apr-1946 Today's Date: 11/03/2016 Time: 4496-7591 SLP Time Calculation (min) (ACUTE ONLY): 16 min  Assessment / Plan / Recommendation Clinical Impression  Dysphagia treatment focused on potential diet upgrade and use of compensatory strategies. Patient utilizing chin tuck and multiple swallows to assist with clearance of residue and airway protection with min-mod verbal cueing. Trials of thin liquid provided without overt s/s of aspiration. Patient may advance to thin liquids with strict use of chin tuck to decrease aspiration risk.   PMSV in place upon arrival to room. Trach changed to 4.0 cuffless shiley. Patient phonating well with 100% intelligibility, mild-moderate hoarse quality (? Baseline). Patient able to return demonstration of placement and removal of valve with supervision cueing. Educated patient on PMSV precautions and cleaning procedure. She verbalized understanding.   Recommend f/u in am for education with family prior to discharge regarding swallow and PMSV use.    HPI HPI: 70 yo female with multiple medical problems including dCHF, GERD, hx ETOH, AFib, CKD, angioedema with chronic upper airway narrowing after an intubation with recent laryngoscopy by ENT with removal of granulation tissue 3 weeks prior to admit but has continued to have dyspnea and stridor. She presented 11/1 to ER with ongoing dyspnea and stridor. She initially improved with steroids, BD and ativan and was seen by ENT who scheduled direct laryngoscopy for 11/2 but wanted cardiology clearance first r/t troponin 0.57. Over time she became increasingly stridorous, tripoding, diaphoretic and required intubation in ER. Pt was intubated 11/2-11/6. Two MBS studies (11/10 and 11/13) revealed moderate-severe oropharyngeal dysphagia with silent aspiration, diffuse residue, and recs for  conservative diet of dysphagia 1 with pudding-thick liquids.  Pt subsequently underwent tracheotomy on 11/15 due to increasing stridor and labored breathing.       SLP Plan  Continue with current plan of care     Recommendations  Diet recommendations: Dysphagia 2 (fine chop);Thin liquid Liquids provided via: Cup;No straw Medication Administration: Crushed with puree Supervision: Full supervision/cueing for compensatory strategies;Staff to assist with self feeding Compensations: Minimize environmental distractions;Small sips/bites;Slow rate;Multiple dry swallows after each bite/sip;Chin tuck Postural Changes and/or Swallow Maneuvers: Seated upright 90 degrees;Upright 30-60 min after meal      Patient may use Passy-Muir Speech Valve: During all waking hours (remove during sleep) PMSV Supervision: Intermittent         Oral Care Recommendations: Oral care BID Follow up Recommendations: Home health SLP Plan: Continue with current plan of care       [place], [place] [phone]    Gabriel Rainwater Meryl 11/03/2016, 3:48 PM

## 2016-11-03 NOTE — Progress Notes (Signed)
Occupational Therapy Treatment Patient Details Name: [name] MRN: [id number] DOB: 1945/12/22 Today's Date: 11/03/2016    History of present illness 70 yo female with multiple medical problems including dCHF, GERD, hx ETOH, AFib, CKD, angioedema with chronic upper airway narrowing after an intubation with recent laryngoscopy by ENT with removal of granulation tissue 3 weeks prior to admit but has continued to have dyspnea and stridor.  She presented 11/1 to ER with ongoing dyspnea and stridor. Pt was intubated 11/2 and extubated 11/6. Trach placed 10/29/16.   OT comments  Pt making progress with activity tolerance during adls as well as independence with basic adls. Pt was able to complete 3 adls in a row in a 30 min session today with 2 rest breaks.  Family in asking a lot of questions re: d/c and trach care.  Nursing aware.  Will continue to follow.  Follow Up Recommendations  Home health OT;Supervision/Assistance - 24 hour    Equipment Recommendations  None recommended by OT    Recommendations for Other Services      Precautions / Restrictions Precautions Precautions: Fall Restrictions Weight Bearing Restrictions: No       Mobility Bed Mobility               General bed mobility comments: Pt up in chair  Transfers Overall transfer level: Needs assistance Equipment used: Rolling walker (2 wheeled) Transfers: Sit to/from Stand Sit to Stand: Min guard Stand pivot transfers: Min guard       General transfer comment: Pt did not need physical assist to stand x2 from chair and toilet.  Pt did require cues for hand placement each time she sat down.    Balance Overall balance assessment: Needs assistance Sitting-balance support: Feet supported Sitting balance-Leahy Scale: Good     Standing balance support: Bilateral upper extremity supported;During functional activity Standing balance-Leahy Scale: Fair Standing balance comment: Pt stood at sink to groom  and at toilet.  Pt able to let go of walker for very short amounts of time.  Pt fatigues quickly, therefore recommend she has walker with her at all times.                   ADL Overall ADL's : Needs assistance/impaired     Grooming: Wash/dry hands;Oral care;Brushing hair;Supervision/safety;Sitting;Standing Grooming Details (indicate cue type and reason): pt began tasks sitting at sink but ended all grooming while standing at the sink.  No LOB shile standing.         Upper Body Dressing : Set up;Supervision/safety;Sitting   Lower Body Dressing: Min guard;Sit to/from stand Lower Body Dressing Details (indicate cue type and reason): Pt able to reach feet to donn and doff socks and shoes.  Min guard provided only when on her feet for safety.  Toilet Transfer: Min guard;Ambulation;Comfort height toilet;RW;Grab bars Toilet Transfer Details (indicate cue type and reason): Pt walked to bathroom and transferred on and off commode with min guard after grooming at the sink.   Toileting- Water quality scientist and Hygiene: Min guard;Sit to/from stand Toileting - Clothing Manipulation Details (indicate cue type and reason): cues to rest before standing to return to chair.     Functional mobility during ADLs: Min guard;Rolling walker General ADL Comments: Pt doing better wtih adls; able to reach overhead to comb hair and able to do several adls in a row (brushing teeth, LE dressing and toileting which is an improvement in activity tolerance.      Vision  Perception     Praxis      Cognition   Behavior During Therapy: WFL for tasks assessed/performed Overall Cognitive Status: Within Functional Limits for tasks assessed Area of Impairment: Memory     Memory: Decreased short-term memory               Extremity/Trunk Assessment               Exercises     Shoulder Instructions       General Comments      Pertinent Vitals/ Pain        Pain Assessment: No/denies pain  Home Living                                          Prior Functioning/Environment              Frequency  Min 2X/week        Progress Toward Goals  OT Goals(current goals can now be found in the care plan section)  Progress towards OT goals: Progressing toward goals  Acute Rehab OT Goals Patient Stated Goal: to get stronger  OT Goal Formulation: With patient Time For Goal Achievement: 11/10/16 Potential to Achieve Goals: Good ADL Goals Pt Will Perform Grooming: with supervision;standing Pt Will Perform Lower Body Bathing: with supervision;sit to/from stand Pt Will Perform Lower Body Dressing: with supervision;sit to/from stand Pt Will Transfer to Toilet: with supervision;ambulating;regular height toilet;bedside commode;grab bars Pt Will Perform Tub/Shower Transfer: Tub transfer;with min guard assist;ambulating;tub bench;grab bars;rolling walker Pt/caregiver will Perform Home Exercise Program: Increased strength;Both right and left upper extremity;With Supervision;With written HEP provided Additional ADL Goal #1: Pt will be independent with energy conservation technqiues during ADLs  Plan Discharge plan remains appropriate    Co-evaluation                 End of Session Equipment Utilized During Treatment: Rolling walker;Oxygen   Activity Tolerance Patient tolerated treatment well   Patient Left in chair;with call bell/phone within reach;with family/visitor present   Nurse Communication Mobility status        Time: 8295-6213 OT Time Calculation (min): 28 min  Charges: OT General Charges $OT Visit: 1 Procedure OT Treatments $Self Care/Home Management : 23-37 mins  Glenford Peers 11/03/2016, 11:25 AM  [phone]

## 2016-11-03 NOTE — Care Management Note (Addendum)
Case Management Note  Patient Details  Name: [name] MRN: [id number] Date of Birth: 06/26/1946  Subjective/Objective:   Acute Hypoxic Resp Failure s/t stridor and laryngeal stenosis, Afib,  CHF, COPD, HTN, Leukocytosis                 Action/Plan: Discharge Planning: NCM spoke to son, Dalia Jollie # [phone] via phone. Family will provided 24 hours assistance at home. Offered choice for HH/list left in room. Son states pt has Rollator, bedside commode, and shower chair at home. Son agreeable to [facility] for [facility]. Contacted [facility] Liaison with new referral. Pt scheduled to dc home 11/04/2016 with trach. Notified attending for [facility] RN, PT, OT and aide with F2F orders.   PCP Lucila Maine C MD    11/04/2016 1045 NCM spoke to son, Marta Antu and he did receive his teaching on trach care. [facility] did speak to him about delivery of supplies to home. [place] Liaison notified pt dc home today with HHPT, OT and RN. Pt ambulating sats 96% per PT last note.   Expected Discharge Date:  11/04/2016                Expected Discharge Plan:  [place]  In-House Referral:  NA  Discharge planning Services  CM Consult  Post Acute Care Choice:  Home Health Choice offered to:  Adult Children  DME Arranged:  Trach supplies DME Agency:  [place] Arranged:  RN, PT, OT, Nurse's Aide [place] Agency:  [place]  Status of Service:  Completed, signed off  If discussed at [place] of Stay Meetings, dates discussed:    Additional Comments:  Erenest Rasher, RN 11/03/2016, 1:42 PM

## 2016-11-03 NOTE — Progress Notes (Signed)
11/03/2016 8:37 AM  [place], [name] [phone]  Post-Op Day 5    Temp:  [98.1 F (36.7 C)-99.3 F (37.4 C)] 98.1 F (36.7 C) (11/20 0742) Pulse Rate:  [70-82] 79 (11/20 0742) Resp:  [13-19] 18 (11/20 0742) BP: (107-127)/(76-95) 127/90 (11/20 0742) SpO2:  [97 %-100 %] 100 % (11/20 0742) FiO2 (%):  [21 %] 21 % (11/20 0344) Weight:  [72.8 kg (160 lb 7.9 oz)] 72.8 kg (160 lb 7.9 oz) (11/20 0342),     Intake/Output Summary (Last 24 hours) at 11/03/16 0837 Last data filed at 11/03/16 0700  Gross per 24 hour  Intake             2100 ml  Output                0 ml  Net             2100 ml    Results for orders placed or performed during the hospital encounter of 10/15/16 (from the past 24 hour(s))  Glucose, capillary     Status: Abnormal   Collection Time: 11/02/16 12:37 PM  Result Value Ref Range   Glucose-Capillary 138 (H) 65 - 99 mg/dL  Glucose, capillary     Status: Abnormal   Collection Time: 11/02/16  5:01 PM  Result Value Ref Range   Glucose-Capillary 132 (H) 65 - 99 mg/dL  Glucose, capillary     Status: Abnormal   Collection Time: 11/02/16  9:46 PM  Result Value Ref Range   Glucose-Capillary 134 (H) 65 - 99 mg/dL   Comment 1 Notify RN   Basic metabolic panel     Status: Abnormal   Collection Time: 11/03/16  2:24 AM  Result Value Ref Range   Sodium 138 135 - 145 mmol/L   Potassium 3.0 (L) 3.5 - 5.1 mmol/L   Chloride 107 101 - 111 mmol/L   CO2 23 22 - 32 mmol/L   Glucose, Bld 149 (H) 65 - 99 mg/dL   BUN 16 6 - 20 mg/dL   Creatinine, Ser 1.07 (H) 0.44 - 1.00 mg/dL   Calcium 8.8 (L) 8.9 - 10.3 mg/dL   GFR calc non Af Amer 51 (L) >60 mL/min   GFR calc Af Amer 59 (L) >60 mL/min   Anion gap 8 5 - 15    SUBJECTIVE:  Less pain.  Using voice with PMV well.  OBJECTIVE:  Trach stoma clean.  Changed to #4 cuffless trach tube.  tol well.  IMPRESSION:  Satisfactory check  PLAN:  Continue pt and family home teaching.  Home health arrangements.  OK for discharge  when OK per IM.  Recheck my office 2 weeks please.  [name]

## 2016-11-03 NOTE — Progress Notes (Signed)
Physical Therapy Treatment Patient Details Name: [name] MRN: [id number] DOB: Feb 14, 1946 Today's Date: 11-06-16    History of Present Illness 70 yo female with multiple medical problems including dCHF, GERD, hx ETOH, AFib, CKD, angioedema with chronic upper airway narrowing after an intubation with recent laryngoscopy by ENT with removal of granulation tissue 3 weeks prior to admit but has continued to have dyspnea and stridor.  She presented 11/1 to ER with ongoing dyspnea and stridor. Pt was intubated 11/2 and extubated 11/6. Trach placed 10/29/16.    PT Comments    Pt making steady progress.   Follow Up Recommendations  Home health PT;Supervision/Assistance - 24 hour     Equipment Recommendations  None recommended by PT    Recommendations for Other Services       Precautions / Restrictions Precautions Precautions: Fall Restrictions Weight Bearing Restrictions: No    Mobility  Bed Mobility               General bed mobility comments: Pt up in chair  Transfers Overall transfer level: Needs assistance Equipment used: Rolling walker (2 wheeled) Transfers: Sit to/from Stand Sit to Stand: Min guard         General transfer comment: Assist for safety  Ambulation/Gait Ambulation/Gait assistance: Supervision Ambulation Distance (Feet): 125 Feet Assistive device: 4-wheeled walker Gait Pattern/deviations: Step-through pattern;Decreased stride length Gait velocity: decr Gait velocity interpretation: Below normal speed for age/gender General Gait Details: assist for safey. Pt with amb with RA with passy-muir valve on and SpO2 >96%   Stairs            Wheelchair Mobility    Modified Rankin (Stroke Patients Only)       Balance Overall balance assessment: Needs assistance Sitting-balance support: No upper extremity supported;Feet supported Sitting balance-Leahy Scale: Good     Standing balance support: Bilateral upper extremity  supported Standing balance-Leahy Scale: Poor Standing balance comment: UE support                    Cognition Arousal/Alertness: Awake/alert Behavior During Therapy: WFL for tasks assessed/performed Overall Cognitive Status: Within Functional Limits for tasks assessed Area of Impairment: Memory     Memory: Decreased short-term memory              Exercises      General Comments        Pertinent Vitals/Pain Pain Assessment: No/denies pain    Home Living                      Prior Function            PT Goals (current goals can now be found in the care plan section) Progress towards PT goals: Progressing toward goals    Frequency    Min 3X/week      PT Plan Current plan remains appropriate    Co-evaluation             End of Session Equipment Utilized During Treatment: Gait belt Activity Tolerance: Patient tolerated treatment well Patient left: in chair;with call bell/phone within reach     Time: 1535-1544 PT Time Calculation (min) (ACUTE ONLY): 9 min  Charges:  $Gait Training: 8-22 mins                    G CodesShary Decamp [place] 06-Nov-2016, 4:10 PM [employer] PT [phone]

## 2016-11-03 NOTE — Care Management Important Message (Signed)
Important Message  Patient Details  Name: [name] MRN: [id number] Date of Birth: 1946-08-12   Medicare Important Message Given:  Yes    Nathen May 11/03/2016, 10:45 AM

## 2016-11-03 NOTE — Progress Notes (Signed)
Family Medicine Teaching Service Daily Progress Note Intern Pager: [phone]  Patient name: [name] Medical record number: [id number] Date of birth: June 29, 1946 Age: 70 y.o. Gender: female  Primary Care Provider: Steve Rattler, DO Consultants: CCM, ENT Code Status: FULL  Pt Overview and Major Events to Date:  10/5 direct laryngoscopy with biopsy and bronchoscopy for granulation tissue 11/1 Admit to CCM, intubated 11/2 ablation of subepiglottic granulation tissue, failed extubation in OR, re-intubated 11/6 extubated in OR  11/9 CCM>FMTS 11/11 FMTS>CCM  11/14 CCM>FMTS 11/15 Trach placed  Assessment and Plan: 70 yo female with PMH of CHF, GERD, hx ETOH, AFib, CKD, angioedema with chronic upper airway narrowing after an intubation with recent laryngoscopy by ENT with removal of granulation tissue 3 weeks prior to admit but had continued to have dyspnea and stridor.    #Acute Hypoxic Respiratory Failure 2/2 to Stridor from Laryngeal Stenosis, Improved:  S/p ablation of subepiglottic granulation tissue 11/2. ENT following. Tracheostomy with direct laryngoscopy placed on 51/88 by Dr. Erik Obey who plans for cuffless trach per ENT 11/20 or 11/21 prior to discharge.  -PT/OT recommending HH PT/OT 24 h supervision after d/c. -passed swallow study per SLP, dysphagia diet with thick liquids -Monitor respiratory status -ENT consulting, appreciate recs. Changed to cuffless trach today  #Recurrent Chest Pain, Acute, Resolved: Troponins downtrending 11/1-11/7.  Right heart cath and left heart cath on 08/19/2016 with normal coronary arteries, severe diastolic dysfunction, normal LV.   -Continue to monitor for CP -Telemetry  #Atrial Fibrillation, Chronic, Rate-Controlled: CHAD-VAsc score of 4.  Patient not on anticoagulation given history of GI bleeding. Pacemaker in place. -continue Aspirin 81 mg daily -Telemetry  #Hypernatremia, Acute, improving:  Sodium [phone] -Monitor  BMET daily -Replace electrolytes as needed  #Chronic Kidney Disease Stage III, Stable:  Creatinine this AM 1.15. Baseline ~1.3. -Avoid nephrotoxic meds   #Mild Leukocytosis, Acute, Stable: WBC tending up from 10.6 to 12.0 11/17. Patient continues to be afebrile. No need to follow CBCs daily. -Monitor for signs of infection  #Hypertension, Chronic, Controlled: BP 127/90 overnight.  -Holding home Lasix -Hydralazine prn, has not required any  #Moderate COPD, Chronic, Stable: Albuterol prn at home.  -Albuterol neb q6hrs prn  #HLD, Chronic, Stable: -Continue home Crestor 20mg    #CHF, Stable: Does not appear to be in fluid overload. On Lasix 20mg  at home, held this admission. -Daily weights  #Type 2 Diabetes, New Diagnosis:  A1c 6.9% on 11/1. Not on any diabetes meds at home. Sugars running 110-150's. Fasting CBG this am 149 -CBG AC/HS -Sensitive SSI   FEN/GI: advancing diet, D51/2NS @125 /hr PPx: SCDs, heparin SQ   Disposition: Continue to monitor on trach, pending wean off O2 and swallow evaluation today  Subjective:  [name] is feeling well today, she does not quite feel ready to go home yet but feels better with cuffless trach in place.   Objective: Temp:  [98.1 F (36.7 C)-99.3 F (37.4 C)] 98.2 F (36.8 C) (11/20 0334) Pulse Rate:  [70-82] 82 (11/20 0344) Resp:  [13-22] 16 (11/20 0344) BP: (107-138)/(76-95) 116/82 (11/20 0334) SpO2:  [97 %-100 %] 97 % (11/20 0344) FiO2 (%):  [21 %] 21 % (11/20 0344) Weight:  [160 lb 7.9 oz (72.8 kg)] 160 lb 7.9 oz (72.8 kg) (11/20 0342)   Physical Exam: General: sitting up in bedside chair, in no acute distress Cardiovascular: RRR, no MRG Respiratory: no increased work of breathing, lungs clear bilaterally, trach in place  Abdomen: soft, NT, ND +bs Extremities: warm, well perfused,  no edema  Laboratory:  Recent Labs Lab 10/29/16 0219 10/30/16 0329 10/31/16 0357  WBC 8.9 10.6* 12.0*  HGB 10.3* 10.9*  10.0*  HCT 34.3* 35.9* 33.7*  PLT 232 199 160    Recent Labs Lab 11/01/16 0230 11/02/16 0359 11/03/16 0224  NA 149* 143 138  K 3.5 3.2* 3.0*  CL 114* 110 107  CO2 28 25 23   BUN 22* 19 16  CREATININE 1.15* 1.07* 1.07*  CALCIUM 9.3 8.8* 8.8*  GLUCOSE 143* 151* 149*   Imaging/Diagnostic Tests:  No results found.   Steve Rattler, DO 11/03/2016, 7:21 AM PGY-1, [place] Intern pager: [phone], text pages welcome

## 2016-11-04 DIAGNOSIS — I482 Chronic atrial fibrillation: Secondary | ICD-10-CM | POA: Diagnosis not present

## 2016-11-04 DIAGNOSIS — Z48813 Encounter for surgical aftercare following surgery on the respiratory system: Secondary | ICD-10-CM | POA: Diagnosis not present

## 2016-11-04 DIAGNOSIS — I509 Heart failure, unspecified: Secondary | ICD-10-CM | POA: Diagnosis not present

## 2016-11-04 DIAGNOSIS — D72829 Elevated white blood cell count, unspecified: Secondary | ICD-10-CM | POA: Diagnosis not present

## 2016-11-04 DIAGNOSIS — Z430 Encounter for attention to tracheostomy: Secondary | ICD-10-CM | POA: Diagnosis not present

## 2016-11-04 DIAGNOSIS — E785 Hyperlipidemia, unspecified: Secondary | ICD-10-CM | POA: Diagnosis not present

## 2016-11-04 DIAGNOSIS — I130 Hypertensive heart and chronic kidney disease with heart failure and stage 1 through stage 4 chronic kidney disease, or unspecified chronic kidney disease: Secondary | ICD-10-CM | POA: Diagnosis not present

## 2016-11-04 DIAGNOSIS — J449 Chronic obstructive pulmonary disease, unspecified: Secondary | ICD-10-CM | POA: Diagnosis not present

## 2016-11-04 DIAGNOSIS — Z23 Encounter for immunization: Secondary | ICD-10-CM | POA: Diagnosis not present

## 2016-11-04 DIAGNOSIS — Z7951 Long term (current) use of inhaled steroids: Secondary | ICD-10-CM | POA: Diagnosis not present

## 2016-11-04 DIAGNOSIS — N183 Chronic kidney disease, stage 3 (moderate): Secondary | ICD-10-CM | POA: Diagnosis not present

## 2016-11-04 DIAGNOSIS — E1122 Type 2 diabetes mellitus with diabetic chronic kidney disease: Secondary | ICD-10-CM | POA: Diagnosis not present

## 2016-11-04 LAB — BASIC METABOLIC PANEL
Anion gap: 8 (ref 5–15)
BUN: 15 mg/dL (ref 6–20)
CALCIUM: 9.2 mg/dL (ref 8.9–10.3)
CHLORIDE: 105 mmol/L (ref 101–111)
CO2: 27 mmol/L (ref 22–32)
CREATININE: 1.24 mg/dL — AB (ref 0.44–1.00)
GFR calc non Af Amer: 42 mL/min — ABNORMAL LOW (ref >60)
GFR, EST AFRICAN AMERICAN: 49 mL/min — AB (ref >60)
GLUCOSE: 106 mg/dL — AB (ref 65–99)
Potassium: 3.3 mmol/L — ABNORMAL LOW (ref 3.5–5.1)
Sodium: 140 mmol/L (ref 135–145)

## 2016-11-04 LAB — GLUCOSE, CAPILLARY: GLUCOSE-CAPILLARY: 97 mg/dL (ref 65–99)

## 2016-11-04 MED ORDER — FUROSEMIDE 40 MG PO TABS
40.0000 mg | ORAL_TABLET | Freq: Every day | ORAL | Status: DC
  Administered 2016-11-04: 40 mg via ORAL
  Filled 2016-11-04: qty 1

## 2016-11-04 NOTE — Progress Notes (Signed)
Speech Language Pathology Treatment: Dysphagia;Passy Muir Speaking valve  Patient Details Name: [name] MRN: [id number] DOB: 1946/11/19 Today's Date: 11/04/2016 Time: 1030-1047 SLP Time Calculation (min) (ACUTE ONLY): 17 min  Assessment / Plan / Recommendation Clinical Impression  Pt seen for education regarding PMSV and safe swallow precautions. Pt wearing PMSV when SLP entered the room. Pt was able to demonstrate donning and doffing of valve independently. Trace dried secretions noted lining the interior of the valve, which were removed when SLP cleaned valve. Tether was attached to valve, and pt received education regarding use of the tether, when to remove PMSV, and nightly cleaning of valve. Pt was encouraged to check valve carefully for dried secretions, to avoid difficulty breathing or communicating effectively. Pt was offered the opportunity to ask questions regarding use and care of PMSV, and had none. Pt was observed drinking thin liquid (water) vai cup sip. Visual and verbal cues required to remind pt to tuck chin with liquids. Pt was encouraged to avoid straw use, and continue to follow posted precautions. Education regarding swallow safety and PMSV completed at this level of care.   Recommend home health ST to follow up to insure carryover of information and provide additional information to family if necessary (none present at this time).    HPI HPI: 70 yo female with multiple medical problems including dCHF, GERD, hx ETOH, AFib, CKD, angioedema with chronic upper airway narrowing after an intubation with recent laryngoscopy by ENT with removal of granulation tissue 3 weeks prior to admit but has continued to have dyspnea and stridor. She presented 11/1 to ER with ongoing dyspnea and stridor. She initially improved with steroids, BD and ativan and was seen by ENT who scheduled direct laryngoscopy for 11/2 but wanted cardiology clearance first r/t troponin 0.57. Over time  she became increasingly stridorous, tripoding, diaphoretic and required intubation in ER. Pt was intubated 11/2-11/6. Two MBS studies (11/10 and 11/13) revealed moderate-severe oropharyngeal dysphagia with silent aspiration, diffuse residue, and recs for conservative diet of dysphagia 1 with pudding-thick liquids.  Pt subsequently underwent tracheotomy on 11/15 due to increasing stridor and labored breathing.       SLP Plan  Pt scheduled for Discharge this date. Home health ST is recommended for follow up at home.    Recommendations  Diet recommendations: Dysphagia 2 (fine chop);Thin liquid (chin tuck with thin liquids) Liquids provided via: No straw;Cup Medication Administration: Crushed with puree Supervision: Full supervision/cueing for compensatory strategies;Staff to assist with self feeding Compensations: Minimize environmental distractions;Small sips/bites;Slow rate;Multiple dry swallows after each bite/sip;Chin tuck Postural Changes and/or Swallow Maneuvers: Seated upright 90 degrees;Upright 30-60 min after meal;Out of bed for meals      Patient may use Passy-Muir Speech Valve: During all waking hours (remove during sleep) PMSV Supervision: Intermittent         Oral Care Recommendations: Oral care BID Follow up Recommendations: Home health SLP Plan: Continue with current plan of care       [name] 11/04/2016, 10:52 AM  [name] [facility], [place] [phone]

## 2016-11-04 NOTE — Progress Notes (Signed)
Family Medicine Teaching Service Daily Progress Note Intern Pager: [phone]  Patient name: [name] Medical record number: [id number] Date of birth: Sep 06, 1946 Age: 70 y.o. Gender: female  Primary Care Provider: Steve Rattler, DO Consultants: CCM, ENT Code Status: FULL  Pt Overview and Major Events to Date:  10/5 direct laryngoscopy with biopsy and bronchoscopy for granulation tissue 11/1 Admit to CCM, intubated 11/2 ablation of subepiglottic granulation tissue, failed extubation in OR, re-intubated 11/6 extubated in OR  11/9 CCM>FMTS 11/11 FMTS>CCM  11/14 CCM>FMTS 11/15 Trach placed  Assessment and Plan: 70 yo female with PMH of CHF, GERD, hx ETOH, AFib, CKD, angioedema with chronic upper airway narrowing after an intubation with recent laryngoscopy by ENT with removal of granulation tissue 3 weeks prior to admit but had continued to have dyspnea and stridor.    #Acute Hypoxic Respiratory Failure 2/2 to Stridor from Laryngeal Stenosis, Improved:  S/p ablation of subepiglottic granulation tissue 11/2. ENT following. Tracheostomy with direct laryngoscopy placed on 13/08 by Dr. Erik Obey who plans for cuffless trach per ENT 11/20 or 11/21 prior to discharge.  -PT/OT recommending HH PT/OT 24 h supervision after d/c. -continue dysphagia diet with thick liquids -ENT cleared patient for d/c from their perspective with f/up in 2 weeks outpatient  #Recurrent Chest Pain, Acute, Resolved: Troponins downtrending 11/1-11/7.  Right heart cath and left heart cath on 08/19/2016 with normal coronary arteries, severe diastolic dysfunction, normal LV.   -Continue to monitor for CP -Telemetry  #Atrial Fibrillation, Chronic, Rate-Controlled: CHAD-VAsc score of 4.  Patient not on anticoagulation given history of GI bleeding. Pacemaker in place. -continue Aspirin 81 mg daily -Telemetry  #Hypernatremia, Acute, improving:  Sodium 146>149>143>138>140 -Monitor BMET  -Replace electrolytes  as needed  #Chronic Kidney Disease Stage III, Stable:  Creatinine this AM 1.15. Baseline ~1.3. -Avoid nephrotoxic meds   #Mild Leukocytosis, Acute, Stable: WBC tending up from 10.6 to 12.0 11/17. Patient continues to be afebrile. No need to follow CBCs daily. -Monitor for signs of infection  #Hypertension, Chronic, Controlled: BP 127/90 overnight.  -Restarted home Lasix  #Moderate COPD, Chronic, Stable: Albuterol prn at home.  -Albuterol neb q6hrs prn  #HLD, Chronic, Stable: -Continue home Crestor 20mg    #CHF, Stable: On Lasix 20mg  at home, held this admission. -s/p one dose of IV lasix yesterday for LE edema, output 750mL -will restart home Lasix today prior to d/c -Daily weights  #Type 2 Diabetes, New Diagnosis:  A1c 6.9% on 11/1. Not on any diabetes meds at home. Sugars running 110-150's. Fasting CBG this am 106 -CBG AC/HS -Sensitive SSI   FEN/GI: advancing diet, D51/2NS @125 /hr PPx: SCDs, heparin SQ   Disposition: Discharge home today, home health PT/OT/RN  Subjective:  [name] is doing very well today. She is excited to go home for the holidays. Her swelling in her legs has improved some but is still more edematous since we have been holding lasix in the hospital. She is anxious about trach care but glad she will have home health care.  Objective: Temp:  [97.3 F (36.3 C)-98.9 F (37.2 C)] 97.8 F (36.6 C) (11/21 0808) Pulse Rate:  [74-75] 74 (11/21 0808) Resp:  [14-22] 15 (11/21 0808) BP: (109-129)/(77-97) 119/78 (11/21 0808) SpO2:  [92 %-100 %] 100 % (11/21 0808) FiO2 (%):  [21 %] 21 % (11/21 0400) Weight:  [160 lb 15 oz (73 kg)] 160 lb 15 oz (73 kg) (11/21 0500)   Physical Exam: General: elderly pleasant lady, sitting up in bedside chair, in no acute  distress Cardiovascular: RRR, no MRG Respiratory: no increased work of breathing, lungs clear bilaterally, trach in place  Abdomen: soft, NT, ND +bs Extremities: warm, well perfused,  compression stockings in place, +1 edema in lower extremities bilaterally  Laboratory:  Recent Labs Lab 10/29/16 0219 10/30/16 0329 10/31/16 0357  WBC 8.9 10.6* 12.0*  HGB 10.3* 10.9* 10.0*  HCT 34.3* 35.9* 33.7*  PLT 232 199 160    Recent Labs Lab 11/02/16 0359 11/03/16 0224 11/04/16 0220  NA 143 138 140  K 3.2* 3.0* 3.3*  CL 110 107 105  CO2 25 23 27   BUN 19 16 15   CREATININE 1.07* 1.07* 1.24*  CALCIUM 8.8* 8.8* 9.2  GLUCOSE 151* 149* 106*   Imaging/Diagnostic Tests:  No results found.   Steve Rattler, DO 11/04/2016, 9:26 AM PGY-1, [place] Intern pager: [phone], text pages welcome

## 2016-11-04 NOTE — Progress Notes (Signed)
Call received from [facility] and order for trach supplies modified to cuffless/uncuffed. Jonnie Finner RN CCM Case Mgmt phone [phone]

## 2016-11-04 NOTE — Progress Notes (Signed)
Occupational Therapy Treatment Patient Details Name: [name] MRN: [id number] DOB: September 12, 1946 Today's Date: 11/04/2016    History of present illness 70 yo female with multiple medical problems including dCHF, GERD, hx ETOH, AFib, CKD, angioedema with chronic upper airway narrowing after an intubation with recent laryngoscopy by ENT with removal of granulation tissue 3 weeks prior to admit but has continued to have dyspnea and stridor.  She presented 11/1 to ER with ongoing dyspnea and stridor. Pt was intubated 11/2 and extubated 11/6. Trach placed 10/29/16.   OT comments  This 70 yo female making progress with basic ADLs from a physical and endurance standpoint. She will continue to benefit from OT at home with A from family prn.  Follow Up Recommendations  Home health OT;Supervision/Assistance - 24 hour    Equipment Recommendations  None recommended by OT       Precautions / Restrictions Precautions Precautions: Fall Precaution Comments: trach collar Restrictions Weight Bearing Restrictions: No       Mobility Bed Mobility               General bed mobility comments: Pt up in chair upon my arrival  Transfers Overall transfer level: Needs assistance Equipment used: Rolling walker (2 wheeled) Transfers: Sit to/from Stand Sit to Stand: Min guard              Balance Overall balance assessment: Needs assistance Sitting-balance support: No upper extremity supported;Feet supported Sitting balance-Leahy Scale: Good     Standing balance support: Bilateral upper extremity supported Standing balance-Leahy Scale: Poor Standing balance comment: UE support on RW or other surface                   ADL Overall ADL's : Needs assistance/impaired     Grooming: Wash/dry hands;Wash/dry face;Oral care;Min guard;Standing (at sink)   Upper Body Bathing: Set up;Supervision/ safety;Sitting   Lower Body Bathing: Minimal assistance (min guard A sit<>stand at  sink)   Upper Body Dressing : Set up;Supervision/safety;Sitting   Lower Body Dressing: Minimal assistance (min guard A sit<>stand (pants a little snug at legs )   Toilet Transfer: Min guard;Ambulation;RW;BSC (in bathroom)   Toileting- Clothing Manipulation and Hygiene: Min guard;Sit to/from stand         General ADL Comments: Pt reported that she did not think she needed to practice the shower stall transfer--felt she knew how to do this from a prior SNF rehab stay                Cognition   Behavior During Therapy: [facility] for tasks assessed/performed Overall Cognitive Status: Within Functional Limits for tasks assessed                                    Pertinent Vitals/ Pain       Pain Assessment: No/denies pain Pain Score: 0-No pain Faces Pain Scale: No hurt         Frequency  Min 2X/week        Progress Toward Goals  OT Goals(current goals can now be found in the care plan section)  Progress towards OT goals: Progressing toward goals     Plan Discharge plan remains appropriate       End of Session Equipment Utilized During Treatment: Rolling walker   Activity Tolerance Patient tolerated treatment well   Patient Left in chair;with call bell/phone within reach;with family/visitor present   Nurse Communication  (  pt needed some drain sponges for trach)        Time: 5277-8242 OT Time Calculation (min): 52 min  Charges: OT General Charges $OT Visit: 1 Procedure OT Treatments $Self Care/Home Management : 38-52 mins  Almon Register [phone] 11/04/2016, 12:08 PM

## 2016-11-04 NOTE — Discharge Instructions (Signed)
Follow up with ENT Dr. Erik Obey in 2 weeks Follow up with your PCP 11/30 at 3:30pm   How to Clean a Tracheostomy Tube, Adult A tracheostomy tube, or trach tube, allows a person to breathe without using his or her nose or mouth. A trach tube may be made up of one tube called an outer cannula. Or, it may be made up of an outer cannula and a second tube called an inner cannula. Keeping a trach tube clean helps prevent infection and helps make breathing easier. Supplies needed:  Towel.  Suction supplies.  Sterile trach care kit. The kit should contain:  4 x 4 inch (10 x 10 cm) gauze pads.  Cotton-tipped swabs.  Trach bandage (dressing).  Container.  0.9% saline solution.  Small trach brush or disposable inner cannula.  Roll of twill tape, trach ties, or a hook and loop trach holder.  Scissors.  Clean gloves. How to clean a trach tube 1. Have all supplies ready and available. 2. Suction the trach tube as needed. 3. Wash your hands well. 4. Put on a clean pair of gloves. 5. Fill the container with 0.9% saline solution. 6. Give oxygen as needed. 7. If the inner cannula is nondisposable:  Touch only the outer part of the trach tube as you unlock and remove the inner cannula.  Drop the inner cannula into 0.9% saline solution.  Use a small brush to clean the inside and outside of the inner cannula.  Hold the inner cannula over the container, and rinse the cannula with 0.9% saline solution.  Pat the inner cannula with gauze to help it get dry.  Replace the inner cannula.  Lock the cannula into position.  Give oxygen as needed. 8. If the inner cannula is disposable:  Remove the new cannula from the packaging.  Touch only the outer part of the trach tube as you remove the inner cannula.  Replace the old cannula with the new cannula.  Lock the cannula into position.  Throw away the old cannula.  Give oxygen as needed. 9. Use gauze or cotton swabs to clean the  outer cannula surfaces, extending 2-4 inches (5-10 cm) in all directions under the neck plate. 10. Use a cotton swab to clean the opening in the neck in a circular motion. Start from the opening and go outward. 11. Dry the skin and the outer cannula by gently patting the area with a dry gauze pad. 12. Secure the trach tube with twill tape, trach ties, or a trach holder. 13. Place a dressing around the trach site. 14. Give oxygen as needed. 15. Throw away any used supplies. 16. Remove your gloves. Seltzer your hands. This information is not intended to replace advice given to you by your health care provider. Make sure you discuss any questions you have with your health care provider. Document Released: 01/13/2007 Document Revised: 08/25/2016 Document Reviewed: 08/25/2016 Elsevier Interactive Patient Education  2017 [employer].

## 2016-11-05 DIAGNOSIS — I509 Heart failure, unspecified: Secondary | ICD-10-CM | POA: Diagnosis not present

## 2016-11-05 DIAGNOSIS — Z430 Encounter for attention to tracheostomy: Secondary | ICD-10-CM | POA: Diagnosis not present

## 2016-11-05 DIAGNOSIS — I482 Chronic atrial fibrillation: Secondary | ICD-10-CM | POA: Diagnosis not present

## 2016-11-05 DIAGNOSIS — Z7951 Long term (current) use of inhaled steroids: Secondary | ICD-10-CM | POA: Diagnosis not present

## 2016-11-05 DIAGNOSIS — E1122 Type 2 diabetes mellitus with diabetic chronic kidney disease: Secondary | ICD-10-CM | POA: Diagnosis not present

## 2016-11-05 DIAGNOSIS — E785 Hyperlipidemia, unspecified: Secondary | ICD-10-CM | POA: Diagnosis not present

## 2016-11-05 DIAGNOSIS — N183 Chronic kidney disease, stage 3 (moderate): Secondary | ICD-10-CM | POA: Diagnosis not present

## 2016-11-05 DIAGNOSIS — I130 Hypertensive heart and chronic kidney disease with heart failure and stage 1 through stage 4 chronic kidney disease, or unspecified chronic kidney disease: Secondary | ICD-10-CM | POA: Diagnosis not present

## 2016-11-05 DIAGNOSIS — J449 Chronic obstructive pulmonary disease, unspecified: Secondary | ICD-10-CM | POA: Diagnosis not present

## 2016-11-05 DIAGNOSIS — D72829 Elevated white blood cell count, unspecified: Secondary | ICD-10-CM | POA: Diagnosis not present

## 2016-11-05 DIAGNOSIS — Z48813 Encounter for surgical aftercare following surgery on the respiratory system: Secondary | ICD-10-CM | POA: Diagnosis not present

## 2016-11-05 DIAGNOSIS — E119 Type 2 diabetes mellitus without complications: Secondary | ICD-10-CM

## 2016-11-07 ENCOUNTER — Other Ambulatory Visit: Payer: Self-pay

## 2016-11-07 NOTE — Patient Outreach (Signed)
    Unsuccessful attempt made to contact patient via telephone for transition of care. HIPPA compliant message left for patient.  Plan: Will request this RNCM's coverage person to attempt contact with patient for transition of care.

## 2016-11-10 ENCOUNTER — Telehealth: Payer: Self-pay | Admitting: Internal Medicine

## 2016-11-10 ENCOUNTER — Telehealth: Payer: Self-pay | Admitting: Family Medicine

## 2016-11-10 ENCOUNTER — Other Ambulatory Visit: Payer: Self-pay

## 2016-11-10 DIAGNOSIS — N183 Chronic kidney disease, stage 3 (moderate): Secondary | ICD-10-CM | POA: Diagnosis not present

## 2016-11-10 DIAGNOSIS — D72829 Elevated white blood cell count, unspecified: Secondary | ICD-10-CM | POA: Diagnosis not present

## 2016-11-10 DIAGNOSIS — Z430 Encounter for attention to tracheostomy: Secondary | ICD-10-CM | POA: Diagnosis not present

## 2016-11-10 DIAGNOSIS — I509 Heart failure, unspecified: Secondary | ICD-10-CM | POA: Diagnosis not present

## 2016-11-10 DIAGNOSIS — Z48813 Encounter for surgical aftercare following surgery on the respiratory system: Secondary | ICD-10-CM | POA: Diagnosis not present

## 2016-11-10 DIAGNOSIS — E785 Hyperlipidemia, unspecified: Secondary | ICD-10-CM | POA: Diagnosis not present

## 2016-11-10 DIAGNOSIS — E1122 Type 2 diabetes mellitus with diabetic chronic kidney disease: Secondary | ICD-10-CM | POA: Diagnosis not present

## 2016-11-10 DIAGNOSIS — I482 Chronic atrial fibrillation: Secondary | ICD-10-CM | POA: Diagnosis not present

## 2016-11-10 DIAGNOSIS — J449 Chronic obstructive pulmonary disease, unspecified: Secondary | ICD-10-CM | POA: Diagnosis not present

## 2016-11-10 DIAGNOSIS — Z7951 Long term (current) use of inhaled steroids: Secondary | ICD-10-CM | POA: Diagnosis not present

## 2016-11-10 DIAGNOSIS — I130 Hypertensive heart and chronic kidney disease with heart failure and stage 1 through stage 4 chronic kidney disease, or unspecified chronic kidney disease: Secondary | ICD-10-CM | POA: Diagnosis not present

## 2016-11-10 NOTE — Patient Outreach (Signed)
[facility]) Care Management  11/10/16  Alyce Inscore 1946/05/02  [id number]   RNCM covering for primary nurse, Erenest Rasher.  Successful outreach completed with patient. Patient identification verified. Patient stated that she had called and was wondering if RNCM was coming out today to check her collar. She reported that it the band is too small and she needed someone to come out and clean it and help her figure out how to put the new one. RNCM advised that RNCM was with [place] Management and explained that I was filling in for her primary nurse, Loni Muse. Assessed if patient has nursing coming out via home health and she stated that she has not had anyone come out at all since she was discharged. RNCM advised that per her discharge instructions, she was supposed to have home health nursing coming out for trach care and offered to call to see if anyone was coming out. RNCM successfully outreached to [place] and spoke with Vaughan Basta out of the [place] office. She confirmed that nursing had opened patient and made an initial visit on 11/04/2016, as well as OT. She stated that they also had a home visit on 11/05/2016 and were scheduled to go out to see patient today. Advised patient was requesting assistance with her trach care and she reassured that someone would be out there today. Returned call to patient and identification verified. RNCM advised patient that her home health nurse was scheduled to come out today from [place] and patient was appreciative. Patient was preoccupied with needing her trach changed and it was difficult to discuss anything else during the initial assessment. Patient stated that she is doing ok since discharge. Currently denies any pain and stated that she only has some soreness from her surgery, but it is not bad.  She stated that she did not think that there were any changes to her medications. She stated tat her kids are  giving her all of her medications as prescribed. She stated that she has a pill box and that they fill it for her so she really does not know what is in there.  She reported that she does have a follow up appointment scheduled with Dr. [name] on 11/13/2016. Patient stated that she has not started weighing herself since discharge home. She stated that her feet are swollen and she has trouble walking and standing, making it difficult to weigh. She reports that she does have some shortness of breath with movement, but she stated that she just stops and rests and it resolves. She reported that she also gets short of breath when she lays down. She reported that she does plan to begin weighing herself and that she hopes that things will be better once she is healed from her surgery. RNCM encouraged to weigh when she was able and to watch for signs and symptoms of heart failure, including the swelling in her feet and ankles and shortness of breath. RNCM educated that this could be signs of fluid building up and she indicated that this has been her normal since surgery.  Patient stated that currently, her only concern is getting her trach cleaned and the band changed. She continued to state that the pieces that she was given in a small box were too small and she needs someone to help her.  RNCM reassured patient that the RN from [facility] would be out to assist her today. RNCM also advised that her primary RNCM will  be reaching out to schedule a home visit. RNCM will communicate with primary RNCM and advise of patient status via in basket. [place] R. Semaj Kham, RN, BSN, [place] Management Coordinator [phone]

## 2016-11-10 NOTE — Telephone Encounter (Signed)
Will forward to Dr Oval Linsey

## 2016-11-10 NOTE — Telephone Encounter (Signed)
Left message to call back  

## 2016-11-10 NOTE — Telephone Encounter (Signed)
Will forward to PCP. Looks like patient is asking for home health nurse.  Derl Barrow, RN

## 2016-11-10 NOTE — Telephone Encounter (Signed)
Pt states the piece that goes around her neck is too small and she needs someone to come out to wash and clean her neck/area around the trach. Please advise. Thanks! ep

## 2016-11-10 NOTE — Telephone Encounter (Signed)
°  New Prob   Requesting verbal order for OT:  Once a week for 1 week  And then two times a week for 4 weeks

## 2016-11-10 NOTE — Telephone Encounter (Signed)
Spoke with [facility] [facility], confirmed a nurse has been out there twice last week and is scheduled to go there today to help patient with trach care.   Attempted to reach out to patient on 3 different numbers with no answer.

## 2016-11-11 ENCOUNTER — Telehealth: Payer: Self-pay | Admitting: Family Medicine

## 2016-11-11 DIAGNOSIS — Z7951 Long term (current) use of inhaled steroids: Secondary | ICD-10-CM | POA: Diagnosis not present

## 2016-11-11 DIAGNOSIS — I482 Chronic atrial fibrillation: Secondary | ICD-10-CM | POA: Diagnosis not present

## 2016-11-11 DIAGNOSIS — I130 Hypertensive heart and chronic kidney disease with heart failure and stage 1 through stage 4 chronic kidney disease, or unspecified chronic kidney disease: Secondary | ICD-10-CM | POA: Diagnosis not present

## 2016-11-11 DIAGNOSIS — E1122 Type 2 diabetes mellitus with diabetic chronic kidney disease: Secondary | ICD-10-CM | POA: Diagnosis not present

## 2016-11-11 DIAGNOSIS — Z48813 Encounter for surgical aftercare following surgery on the respiratory system: Secondary | ICD-10-CM | POA: Diagnosis not present

## 2016-11-11 DIAGNOSIS — J449 Chronic obstructive pulmonary disease, unspecified: Secondary | ICD-10-CM | POA: Diagnosis not present

## 2016-11-11 DIAGNOSIS — N183 Chronic kidney disease, stage 3 (moderate): Secondary | ICD-10-CM | POA: Diagnosis not present

## 2016-11-11 DIAGNOSIS — E785 Hyperlipidemia, unspecified: Secondary | ICD-10-CM | POA: Diagnosis not present

## 2016-11-11 DIAGNOSIS — Z430 Encounter for attention to tracheostomy: Secondary | ICD-10-CM | POA: Diagnosis not present

## 2016-11-11 DIAGNOSIS — D72829 Elevated white blood cell count, unspecified: Secondary | ICD-10-CM | POA: Diagnosis not present

## 2016-11-11 DIAGNOSIS — I509 Heart failure, unspecified: Secondary | ICD-10-CM | POA: Diagnosis not present

## 2016-11-11 NOTE — Telephone Encounter (Signed)
Diane would like verbal orders for OT therapy once a week for one week, then twice a week for four weeks. Please advise. Thanks! ep

## 2016-11-11 NOTE — Telephone Encounter (Signed)
Spoke with Diane and advised Dr Oval Linsey would not be handling her Home Health needs, she will contact patients PCP

## 2016-11-12 ENCOUNTER — Encounter (HOSPITAL_COMMUNITY): Payer: Self-pay

## 2016-11-12 ENCOUNTER — Observation Stay (HOSPITAL_COMMUNITY)
Admission: EM | Admit: 2016-11-12 | Discharge: 2016-11-14 | Disposition: A | Discharge status: Home / Self Care | Payer: Medicare Other | Attending: Family Medicine | Admitting: Family Medicine

## 2016-11-12 ENCOUNTER — Emergency Department (HOSPITAL_COMMUNITY): Payer: Medicare Other

## 2016-11-12 DIAGNOSIS — E1122 Type 2 diabetes mellitus with diabetic chronic kidney disease: Secondary | ICD-10-CM | POA: Insufficient documentation

## 2016-11-12 DIAGNOSIS — I130 Hypertensive heart and chronic kidney disease with heart failure and stage 1 through stage 4 chronic kidney disease, or unspecified chronic kidney disease: Secondary | ICD-10-CM | POA: Insufficient documentation

## 2016-11-12 DIAGNOSIS — I5032 Chronic diastolic (congestive) heart failure: Secondary | ICD-10-CM | POA: Diagnosis not present

## 2016-11-12 DIAGNOSIS — E876 Hypokalemia: Secondary | ICD-10-CM | POA: Insufficient documentation

## 2016-11-12 DIAGNOSIS — D500 Iron deficiency anemia secondary to blood loss (chronic): Secondary | ICD-10-CM | POA: Diagnosis not present

## 2016-11-12 DIAGNOSIS — F329 Major depressive disorder, single episode, unspecified: Secondary | ICD-10-CM | POA: Insufficient documentation

## 2016-11-12 DIAGNOSIS — M1990 Unspecified osteoarthritis, unspecified site: Secondary | ICD-10-CM | POA: Insufficient documentation

## 2016-11-12 DIAGNOSIS — R531 Weakness: Secondary | ICD-10-CM | POA: Insufficient documentation

## 2016-11-12 DIAGNOSIS — D649 Anemia, unspecified: Secondary | ICD-10-CM | POA: Diagnosis present

## 2016-11-12 DIAGNOSIS — R42 Dizziness and giddiness: Secondary | ICD-10-CM | POA: Insufficient documentation

## 2016-11-12 DIAGNOSIS — G2581 Restless legs syndrome: Secondary | ICD-10-CM | POA: Insufficient documentation

## 2016-11-12 DIAGNOSIS — I481 Persistent atrial fibrillation: Secondary | ICD-10-CM | POA: Insufficient documentation

## 2016-11-12 DIAGNOSIS — Z823 Family history of stroke: Secondary | ICD-10-CM | POA: Insufficient documentation

## 2016-11-12 DIAGNOSIS — K760 Fatty (change of) liver, not elsewhere classified: Secondary | ICD-10-CM | POA: Diagnosis not present

## 2016-11-12 DIAGNOSIS — K5520 Angiodysplasia of colon without hemorrhage: Secondary | ICD-10-CM | POA: Diagnosis not present

## 2016-11-12 DIAGNOSIS — K219 Gastro-esophageal reflux disease without esophagitis: Secondary | ICD-10-CM | POA: Diagnosis not present

## 2016-11-12 DIAGNOSIS — I482 Chronic atrial fibrillation: Secondary | ICD-10-CM | POA: Insufficient documentation

## 2016-11-12 DIAGNOSIS — J449 Chronic obstructive pulmonary disease, unspecified: Secondary | ICD-10-CM | POA: Insufficient documentation

## 2016-11-12 DIAGNOSIS — G4730 Sleep apnea, unspecified: Secondary | ICD-10-CM | POA: Diagnosis not present

## 2016-11-12 DIAGNOSIS — R05 Cough: Secondary | ICD-10-CM | POA: Diagnosis not present

## 2016-11-12 DIAGNOSIS — F410 Panic disorder [episodic paroxysmal anxiety] without agoraphobia: Secondary | ICD-10-CM | POA: Insufficient documentation

## 2016-11-12 DIAGNOSIS — I442 Atrioventricular block, complete: Secondary | ICD-10-CM | POA: Diagnosis not present

## 2016-11-12 DIAGNOSIS — Z87891 Personal history of nicotine dependence: Secondary | ICD-10-CM | POA: Insufficient documentation

## 2016-11-12 DIAGNOSIS — Z86010 Personal history of colonic polyps: Secondary | ICD-10-CM | POA: Insufficient documentation

## 2016-11-12 DIAGNOSIS — E785 Hyperlipidemia, unspecified: Secondary | ICD-10-CM | POA: Insufficient documentation

## 2016-11-12 DIAGNOSIS — Z7982 Long term (current) use of aspirin: Secondary | ICD-10-CM | POA: Insufficient documentation

## 2016-11-12 DIAGNOSIS — F411 Generalized anxiety disorder: Secondary | ICD-10-CM | POA: Insufficient documentation

## 2016-11-12 DIAGNOSIS — R0602 Shortness of breath: Secondary | ICD-10-CM | POA: Diagnosis not present

## 2016-11-12 DIAGNOSIS — M353 Polymyalgia rheumatica: Secondary | ICD-10-CM | POA: Diagnosis not present

## 2016-11-12 DIAGNOSIS — Z884 Allergy status to anesthetic agent status: Secondary | ICD-10-CM | POA: Insufficient documentation

## 2016-11-12 DIAGNOSIS — Z950 Presence of cardiac pacemaker: Secondary | ICD-10-CM | POA: Insufficient documentation

## 2016-11-12 DIAGNOSIS — Z888 Allergy status to other drugs, medicaments and biological substances status: Secondary | ICD-10-CM | POA: Insufficient documentation

## 2016-11-12 DIAGNOSIS — R51 Headache: Secondary | ICD-10-CM | POA: Diagnosis not present

## 2016-11-12 DIAGNOSIS — R195 Other fecal abnormalities: Secondary | ICD-10-CM | POA: Insufficient documentation

## 2016-11-12 DIAGNOSIS — K922 Gastrointestinal hemorrhage, unspecified: Secondary | ICD-10-CM | POA: Diagnosis not present

## 2016-11-12 DIAGNOSIS — Z930 Tracheostomy status: Secondary | ICD-10-CM | POA: Diagnosis not present

## 2016-11-12 DIAGNOSIS — N183 Chronic kidney disease, stage 3 (moderate): Secondary | ICD-10-CM | POA: Insufficient documentation

## 2016-11-12 DIAGNOSIS — R4182 Altered mental status, unspecified: Secondary | ICD-10-CM | POA: Diagnosis present

## 2016-11-12 DIAGNOSIS — I422 Other hypertrophic cardiomyopathy: Secondary | ICD-10-CM | POA: Diagnosis not present

## 2016-11-12 DIAGNOSIS — J320 Chronic maxillary sinusitis: Secondary | ICD-10-CM | POA: Insufficient documentation

## 2016-11-12 DIAGNOSIS — I700 Atherosclerosis of aorta: Secondary | ICD-10-CM | POA: Insufficient documentation

## 2016-11-12 DIAGNOSIS — R404 Transient alteration of awareness: Secondary | ICD-10-CM | POA: Diagnosis not present

## 2016-11-12 LAB — CBC WITH DIFFERENTIAL/PLATELET
BASOS ABS: 0 10*3/uL (ref 0.0–0.1)
BASOS PCT: 0 %
EOS ABS: 0 10*3/uL (ref 0.0–0.7)
EOS PCT: 0 %
HCT: 27.3 % — ABNORMAL LOW (ref 36.0–46.0)
Hemoglobin: 8.3 g/dL — ABNORMAL LOW (ref 12.0–15.0)
Lymphocytes Relative: 27 %
Lymphs Abs: 1.3 10*3/uL (ref 0.7–4.0)
MCH: 25.9 pg — AB (ref 26.0–34.0)
MCHC: 30.4 g/dL (ref 30.0–36.0)
MCV: 85.3 fL (ref 78.0–100.0)
Monocytes Absolute: 0.6 10*3/uL (ref 0.1–1.0)
Monocytes Relative: 12 %
NEUTROS PCT: 61 %
Neutro Abs: 3 10*3/uL (ref 1.7–7.7)
PLATELETS: 312 10*3/uL (ref 150–400)
RBC: 3.2 MIL/uL — AB (ref 3.87–5.11)
RDW: 16.3 % — ABNORMAL HIGH (ref 11.5–15.5)
WBC: 5 10*3/uL (ref 4.0–10.5)

## 2016-11-12 LAB — COMPREHENSIVE METABOLIC PANEL
ALK PHOS: 76 U/L (ref 38–126)
ALT: 26 U/L (ref 14–54)
ANION GAP: 12 (ref 5–15)
AST: 33 U/L (ref 15–41)
Albumin: 2 g/dL — ABNORMAL LOW (ref 3.5–5.0)
BUN: 6 mg/dL (ref 6–20)
CALCIUM: 8 mg/dL — AB (ref 8.9–10.3)
CO2: 34 mmol/L — ABNORMAL HIGH (ref 22–32)
CREATININE: 1.08 mg/dL — AB (ref 0.44–1.00)
Chloride: 90 mmol/L — ABNORMAL LOW (ref 101–111)
GFR, EST AFRICAN AMERICAN: 58 mL/min — AB (ref >60)
GFR, EST NON AFRICAN AMERICAN: 50 mL/min — AB (ref >60)
Glucose, Bld: 137 mg/dL — ABNORMAL HIGH (ref 65–99)
Potassium: 2.4 mmol/L — CL (ref 3.5–5.1)
Sodium: 136 mmol/L (ref 135–145)
Total Bilirubin: 1 mg/dL (ref 0.3–1.2)
Total Protein: 5 g/dL — ABNORMAL LOW (ref 6.5–8.1)

## 2016-11-12 LAB — POTASSIUM: POTASSIUM: 2.4 mmol/L — AB (ref 3.5–5.1)

## 2016-11-12 LAB — TROPONIN I
TROPONIN I: 0.46 ng/mL — AB (ref <0.03)
TROPONIN I: 0.48 ng/mL — AB (ref <0.03)

## 2016-11-12 LAB — HEMOGLOBIN AND HEMATOCRIT, BLOOD
HCT: 26 % — ABNORMAL LOW (ref 36.0–46.0)
Hemoglobin: 8.1 g/dL — ABNORMAL LOW (ref 12.0–15.0)

## 2016-11-12 LAB — GLUCOSE, CAPILLARY: Glucose-Capillary: 130 mg/dL — ABNORMAL HIGH (ref 65–99)

## 2016-11-12 LAB — PREPARE RBC (CROSSMATCH)

## 2016-11-12 MED ORDER — ALPRAZOLAM 0.5 MG PO TABS: 0.5000 mg | ORAL_TABLET | Freq: Every evening | ORAL | Status: DC | PRN

## 2016-11-12 MED ORDER — NITROGLYCERIN 0.4 MG SL SUBL: 0.4000 mg | SUBLINGUAL_TABLET | SUBLINGUAL | Status: DC | PRN

## 2016-11-12 MED ORDER — SENNA 8.6 MG PO TABS: 1.0000 | ORAL_TABLET | Freq: Every evening | ORAL | Status: DC | PRN

## 2016-11-12 MED ORDER — POTASSIUM CHLORIDE CRYS ER 20 MEQ PO TBCR
40.0000 meq | EXTENDED_RELEASE_TABLET | Freq: Once | ORAL | Status: AC
  Administered 2016-11-12: 40 meq via ORAL
  Filled 2016-11-12: qty 2

## 2016-11-12 MED ORDER — ENOXAPARIN SODIUM 30 MG/0.3ML ~~LOC~~ SOLN: 30.0000 mg | SUBCUTANEOUS | Status: DC

## 2016-11-12 MED ORDER — ALBUTEROL SULFATE (2.5 MG/3ML) 0.083% IN NEBU: 2.5000 mg | INHALATION_SOLUTION | Freq: Four times a day (QID) | RESPIRATORY_TRACT | Status: DC | PRN

## 2016-11-12 MED ORDER — ALBUTEROL SULFATE (2.5 MG/3ML) 0.083% IN NEBU
2.5000 mg | INHALATION_SOLUTION | Freq: Once | RESPIRATORY_TRACT | Status: AC
  Administered 2016-11-12: 2.5 mg via RESPIRATORY_TRACT
  Filled 2016-11-12: qty 3

## 2016-11-12 MED ORDER — FERROUS SULFATE 325 (65 FE) MG PO TABS
325.0000 mg | ORAL_TABLET | Freq: Two times a day (BID) | ORAL | Status: DC
  Administered 2016-11-13 – 2016-11-14 (×3): 325 mg via ORAL
  Filled 2016-11-12 (×3): qty 1

## 2016-11-12 MED ORDER — ROSUVASTATIN CALCIUM 20 MG PO TABS
20.0000 mg | ORAL_TABLET | Freq: Every day | ORAL | Status: DC
  Administered 2016-11-13 – 2016-11-14 (×2): 20 mg via ORAL
  Filled 2016-11-12 (×2): qty 1

## 2016-11-12 MED ORDER — ACETAMINOPHEN 650 MG RE SUPP: 650.0000 mg | Freq: Four times a day (QID) | RECTAL | Status: DC | PRN

## 2016-11-12 MED ORDER — ASPIRIN EC 81 MG PO TBEC: 81.0000 mg | DELAYED_RELEASE_TABLET | ORAL | Status: DC

## 2016-11-12 MED ORDER — SODIUM CHLORIDE 0.9 % IV SOLN
Freq: Once | INTRAVENOUS | Status: AC
  Administered 2016-11-12: 23:00:00 via INTRAVENOUS

## 2016-11-12 MED ORDER — PANTOPRAZOLE SODIUM 40 MG PO TBEC
40.0000 mg | DELAYED_RELEASE_TABLET | Freq: Every day | ORAL | Status: DC
  Administered 2016-11-13 – 2016-11-14 (×2): 40 mg via ORAL
  Filled 2016-11-12 (×2): qty 1

## 2016-11-12 MED ORDER — SENNOSIDES 8.6 MG PO TABS: 1.0000 | ORAL_TABLET | Freq: Every evening | ORAL | Status: DC | PRN

## 2016-11-12 MED ORDER — POTASSIUM CHLORIDE CRYS ER 20 MEQ PO TBCR
40.0000 meq | EXTENDED_RELEASE_TABLET | Freq: Two times a day (BID) | ORAL | Status: DC
  Administered 2016-11-12: 40 meq via ORAL
  Filled 2016-11-12: qty 2

## 2016-11-12 MED ORDER — ACETAMINOPHEN 325 MG PO TABS
650.0000 mg | ORAL_TABLET | Freq: Four times a day (QID) | ORAL | Status: DC | PRN
  Administered 2016-11-13: 650 mg via ORAL
  Filled 2016-11-12: qty 2

## 2016-11-12 MED ORDER — POLYETHYLENE GLYCOL 3350 17 G PO PACK: 17.0000 g | PACK | Freq: Every day | ORAL | Status: DC | PRN

## 2016-11-12 MED ORDER — FAMOTIDINE IN NACL 20-0.9 MG/50ML-% IV SOLN
20.0000 mg | Freq: Once | INTRAVENOUS | Status: AC
  Administered 2016-11-12: 20 mg via INTRAVENOUS
  Filled 2016-11-12: qty 50

## 2016-11-12 MED ORDER — BISACODYL 10 MG RE SUPP: 10.0000 mg | Freq: Every day | RECTAL | Status: DC | PRN

## 2016-11-12 NOTE — ED Triage Notes (Signed)
Pt arrives EMS from home with c/o dizziness and near syncope. Pt had hx of recent cardiac arrest during procedure. Arrives With trach laced after arrest and intubation.

## 2016-11-12 NOTE — ED Notes (Signed)
Pt up to bedpan but voids on bed.Linen changed.

## 2016-11-12 NOTE — ED Provider Notes (Signed)
[facility] DEPT Provider Note   CSN: [id number] Arrival date & time: 11/12/16  1323     History   Chief Complaint Chief Complaint  Patient presents with  . Altered Mental Status    HPI [name] is a 70 y.o. female.  Pt presents to the ED today with weakness and dizziness.  She was d/c'd on 11/21 after a 3 week stay.  Summary of pt's recent stay by Dr. [name] ([place] resident):  She presented to [facility] ED on 10/15/16 with worsening dyspnea and stridor requiring intubation. She was admitted to [facility] service. She had a direct laryngoscopy with ablation of subepiglottic granulation tissue on 10/16/16 and failed extubation at that time. She was successfully extubated in the OR on 10/20/16 and was transferred out of the unit to FPTS care on 10/23/16. On 10/25/16 she failed a trial off bibpap and was increasingly agitated, requiring re-intubation and transfer back to CCM's service. She was managed in the ICU until 10/28/16 where she was transferred to step down unit. She had a trach placed on 10/29/16. She was evaluated by SLP and cleared for a dysphagia diet. She had her trach replaced to cuffless trach on 11/03/16 and was discharged home on 11/04/16 with home health PT, OT, and RN to continue trach care and supportive management.  Pt has been doing well until today when she felt weak and dizzy.  She called her pcp's office and told them her bp was low.  Pt denies any pain.      Past Medical History:  Diagnosis Date  . Anemia   . Angina   . Angioedema    2/2 ACE  . Arteriovenous malformation of stomach   . Arthritis   . Asthma   . AVM (arteriovenous malformation) of colon    small intestine; stomach  . Blood transfusion   . CHF (congestive heart failure) ([place])   . Complete heart block ([place])    s/p PPM 1998  . Complication of anesthesia    Difficult airway; anaphylaxis and swelling with propofol  . DDD (degenerative disc disease)   . Depression   . Diastolic  heart failure   . Fatty liver 07/26/10  . GERD (gastroesophageal reflux disease)   . GI bleed   . Headache   . History of alcohol abuse Stopped Fall 2012  . History of tobacco use Quit Fall 2012  . Hx of cardiovascular stress test    a. Lexiscan Myoview (10/15):  Small inferolateral and apical defect c/w scar and poss soft tissue attenuation, no ischemia, EF 42%  . Hx of colonic polyp 08/13/10   adenomatous  . Hx of colonoscopy   . Hyperlipidemia   . Hyperlipidemia   . Hypertension   . Hypertrophic cardiomyopathy ([place])    dx by Dr [name] 2009  . Iron deficiency anemia   . Panic attack   . Panic attacks   . Permanent atrial fibrillation ([place])   . Pneumonia   . Renal failure    baseline creatinine 1.6  . Right arm pain 01/08/2012  . RLS (restless legs syndrome)    Dx 06/2007  . Shortness of breath    sob on exertation  . Sleep apnea     Patient Active Problem List   Diagnosis Date Noted  . Diabetes mellitus, type 2 ([place]) 11/05/2016  . 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 Laryngeal stridor   . Respiratory distress   . Acute kidney injury superimposed on chronic kidney disease ([place])   .  Permanent atrial fibrillation ([place])   . Laryngeal stenosis 09/18/2016  . Elevated troponin   . SOB (shortness of breath) 08/17/2016  . Acute encephalopathy   . Esophageal abnormality   . Endotracheal tube present   . Somnolence   . Respiratory failure requiring intubation ([place]) 07/11/2016  . Persistent atrial fibrillation ([place]) 07/10/2016  . Acute respiratory failure with hypoxia ([place])   . Hypotension due to drugs   . Demand ischemia ([place])   . Chest discomfort 02/05/2016  . COPD exacerbation ([place])   . Palpitations   . Chest pain 11/25/2015  . Dyspnea on exertion   . Polymyalgia rheumatica ([place]) 04/10/2015  . Low back pain 03/28/2015  . Hypertrophic cardiomyopathy ([place]) 02/15/2015  . Eczema 09/18/2014  . Environmental allergies 09/18/2014  . Pre-diabetes 11/26/2013  . COPD, moderate  ([place]) 11/01/2013  . Chronic renal failure, stage 3 (moderate) 08/28/2012  . Preventative health care 08/18/2012  . Complete heart block ([place]) 04/03/2011  . Anemia 02/17/2011  . AVM (arteriovenous malformation) 02/13/2011  . PACEMAKER-[place] 11/28/2010  . CHRONIC KIDNEY DISEASE STAGE III (MODERATE) 09/24/2010  . Acute on chronic congestive heart failure ([place]) 08/30/2010  . ARTHRITIS 07/24/2010  . Generalized anxiety disorder 07/23/2010  . Atrial fibrillation ([place]) 07/16/2010  . Hyperlipidemia 02/11/2007  . Essential hypertension 02/11/2007  . APNEA, SLEEP 02/11/2007    Past Surgical History:  Procedure Laterality Date  . CARDIAC CATHETERIZATION    . CARDIAC CATHETERIZATION N/A 08/19/2016   Procedure: Right/Left Heart Cath and Coronary Angiography;  Surgeon: Jolaine Artist, MD;  Location: [facility];  Service: Cardiovascular;  Laterality: N/A;  . DIRECT LARYNGOSCOPY N/A 09/18/2016   Procedure: DIRECT LARYNGOSCOPY, BRONCHOSCOPY, REMOVAL OF INTUBATION GRANULOMA;  Surgeon: [name], MD;  Location: [facility] OR;  Service: ENT;  Laterality: N/A;  . DIRECT LARYNGOSCOPY N/A 10/20/2016   Procedure: EXTUBATION AND FLEXIBLE LARYNGOSCOPE;  Surgeon: [name], MD;  Location: [facility];  Service: ENT;  Laterality: N/A;  . DIRECT LARYNGOSCOPY N/A 10/29/2016   Procedure: DIRECT LARYNGOSCOPY;  Surgeon: [name], MD;  Location: [facility] OR;  Service: ENT;  Laterality: N/A;  . ESOPHAGOGASTRODUODENOSCOPY  12/23/2011   Procedure: ESOPHAGOGASTRODUODENOSCOPY (EGD);  Surgeon: [name], MD;  Location: [facility];  Service: Endoscopy;  Laterality: N/A;  . EXTUBATION (ENDOTRACHEAL) IN OR N/A 07/21/2016   Procedure: EXTUBATION (ENDOTRACHEAL) IN OR;  Surgeon: [name], MD;  Location: [facility];  Service: ENT;  Laterality: N/A;  . GIVENS CAPSULE STUDY  12/23/2011   Procedure: GIVENS CAPSULE STUDY;  Surgeon: [name], MD;  Location: [facility];  Service: Endoscopy;  Laterality: N/A;  .  MICROLARYNGOSCOPY WITH LASER N/A 10/16/2016   Procedure: MICRODIRECTLARYNGOSCOPY WITH LASER ABLATION AND KENLOG INJECTION;  Surgeon: [name], MD;  Location: [facility];  Service: ENT;  Laterality: N/A;  . PACEMAKER INSERTION  1998   [facility], most recent gen change by [name] 4/12  . TRACHEOSTOMY TUBE PLACEMENT N/A 10/29/2016   Procedure: TRACHEOSTOMY;  Surgeon: [name], MD;  Location: [facility];  Service: ENT;  Laterality: N/A;  . TUBAL LIGATION  04/01/2000    OB History    No data available       Home Medications    Prior to Admission medications   Medication Sig Start Date End Date Taking? Authorizing Provider  acetaminophen (TYLENOL) 325 MG tablet Take 325 mg by mouth daily as needed (pain).    Historical Provider, MD  albuterol (PROVENTIL) (2.5 MG/3ML) 0.083% nebulizer solution Take 3 mLs (2.5 mg  total) by nebulization every 6 (six) hours as needed for wheezing or shortness of breath. 08/11/16   Dinah C Ngetich, NP  ALPRAZolam (XANAX) 0.5 MG tablet Take 1 tablet (0.5 mg total) by mouth at bedtime as needed for anxiety. 07/26/16   Domenic Polite, MD  aspirin EC 81 MG tablet Take 81 mg by mouth every morning.     Historical Provider, MD  bisacodyl (DULCOLAX) 10 MG suppository Place 10 mg rectally daily as needed for mild constipation.     Historical Provider, MD  CALCIUM PO Take 1 tablet by mouth once a week.     Historical Provider, MD  ferrous sulfate 325 (65 FE) MG tablet TAKE 1 TABLET BY MOUTH TWICE DAILY WITH MEALS TO KEEP BLOOD COUNT UP Patient taking differently: Take 325 mg by mouth 2 (two) times daily with a meal. TO KEEP BLOOD COUNT UP 08/26/16   Steve Rattler, DO  furosemide (LASIX) 20 MG tablet Take 20 mg by mouth as directed. TAKE 1 TABLET BY MOUTH AS NEEDED FOR WEIGHT GAIN (2 LBS IN 24 HRS OR 5 LBS IN 1 WEEK)    Historical Provider, MD  nitroGLYCERIN (NITROSTAT) 0.4 MG SL tablet Place 1 tablet (0.4 mg total) under the tongue every 5 (five) minutes as needed for chest pain.  01/18/15   Eileen Stanford, PA-C  omeprazole (PRILOSEC) 20 MG capsule Take 1 capsule (20 mg total) by mouth daily. 08/26/16   Steve Rattler, DO  polyethylene glycol (MIRALAX / GLYCOLAX) packet Take 17 g by mouth daily. 07/26/16   Domenic Polite, MD  rosuvastatin (CRESTOR) 20 MG tablet Take 1 tablet (20 mg total) by mouth daily. 08/26/16   Steve Rattler, DO  senna (SENOKOT) 8.6 MG tablet Take 1 tablet by mouth at bedtime as needed for constipation.     Historical Provider, MD  trolamine salicylate (ASPERCREME) 10 % cream Apply 1 application topically 2 (two) times daily as needed for muscle pain.     Historical Provider, MD    Family History Family History  Problem Relation Age of Onset  . Hypertension Mother   . Stroke Mother   . Heart disease Mother   . Aneurysm Mother   . CVA Mother   . Hypertension Father   . Stroke Father   . Heart attack Father   . Alcohol abuse Father   . Hypertension Sister   . Hypertension Brother   . Colon cancer Brother   . Diabetes Sister   . Diabetes Brother   . Cancer Brother   . Heart attack Brother   . Heart disease Sister   . Alcohol abuse Brother     Social History Social History  Substance Use Topics  . Smoking status: Former Smoker    Packs/day: 0.10    Years: 50.00    Types: Cigarettes    Quit date: 08/16/2011  . Smokeless tobacco: Never Used     Comment: quit 2012  . Alcohol use 0.0 oz/week     Comment: quit 08/16/2011     Allergies   Ace inhibitors; Other; and Propofol   Review of Systems Review of Systems  Neurological: Positive for dizziness and weakness.  All other systems reviewed and are negative.    Physical Exam Updated Vital Signs BP 119/65   Pulse 70   Temp 98.6 F (37 C) (Oral)   Resp 18   Ht 5\' 5"  (1.651 m)   Wt 160 lb (72.6 kg)   SpO2 99%  BMI 26.63 kg/m   Physical Exam  Constitutional: She is oriented to person, place, and time. She appears well-developed and well-nourished.  HENT:  Head:  Normocephalic and atraumatic.  Right Ear: External ear normal.  Left Ear: External ear normal.  Nose: Nose normal.  Mouth/Throat: Oropharynx is clear and moist.  Eyes: Conjunctivae and EOM are normal. Pupils are equal, round, and reactive to light.  Neck: Normal range of motion. Neck supple.    Cardiovascular: Normal rate, regular rhythm, normal heart sounds and intact distal pulses.   Pulmonary/Chest: Effort normal and breath sounds normal.  Abdominal: Soft. Bowel sounds are normal.  Genitourinary: Rectal exam shows guaiac positive stool.  Musculoskeletal: Normal range of motion.  Neurological: She is alert and oriented to person, place, and time.  Skin: Skin is warm.  Psychiatric: She has a normal mood and affect. Her behavior is normal. Judgment and thought content normal.  Nursing note and vitals reviewed.    ED Treatments / Results  Labs (all labs ordered are listed, but only abnormal results are displayed) Labs Reviewed  COMPREHENSIVE METABOLIC PANEL - Abnormal; Notable for the following:       Result Value   Potassium 2.4 (*)    Chloride 90 (*)    CO2 34 (*)    Glucose, Bld 137 (*)    Creatinine, Ser 1.08 (*)    Calcium 8.0 (*)    Total Protein 5.0 (*)    Albumin 2.0 (*)    GFR calc non Af Amer 50 (*)    GFR calc Af Amer 58 (*)    All other components within normal limits  TROPONIN I - Abnormal; Notable for the following:    Troponin I 0.46 (*)    All other components within normal limits  CBC WITH DIFFERENTIAL/PLATELET - Abnormal; Notable for the following:    RBC 3.20 (*)    Hemoglobin 8.3 (*)    HCT 27.3 (*)    MCH 25.9 (*)    RDW 16.3 (*)    All other components within normal limits  URINALYSIS, ROUTINE W REFLEX MICROSCOPIC (NOT AT [facility])  POC OCCULT BLOOD, ED  TYPE AND SCREEN    EKG  EKG Interpretation  Date/Time:  Wednesday November 12 2016 13:30:14 EST Ventricular Rate:  70 PR Interval:    QRS Duration: 136 QT Interval:  542 QTC  Calculation: 585 R Axis:   0 Text Interpretation:  Accelerated junctional rhythm Left bundle branch block Baseline wander in lead(s) II III aVL aVF V1 No significant change since last tracing Confirmed by [facility] MD, Jayland Null ([id number]) on 11/12/2016 1:36:04 PM       Radiology Ct Head Wo Contrast  Result Date: 11/12/2016 CLINICAL DATA:  Frontal headache and dizziness inch yesterday. EXAM: CT HEAD WITHOUT CONTRAST TECHNIQUE: Contiguous axial images were obtained from the base of the skull through the vertex without intravenous contrast. COMPARISON:  10/21/2016 FINDINGS: Brain: Mild cerebral atrophy is unchanged. There is no evidence of acute cortical infarct, intracranial hemorrhage, mass, midline shift, or extra-axial fluid collection. Vascular: Mild carotid siphon atherosclerosis. No hyperdense vessel. Skull: No fracture or osseous lesion. Sinuses/Orbits: Chronic bilateral maxillary sinusitis, with increased sinus opacification on the left and decreased opacification on the right compared to the prior study. Small chronic left mastoid effusion is unchanged. Orbits are unremarkable. Other: None. IMPRESSION: 1. No evidence of acute intracranial abnormality. 2. Chronic bilateral maxillary sinusitis, possibly with superimposed acute sinusitis on the left. Electronically Signed   By: Logan Bores  M.D.   On: 11/12/2016 14:33   Dg Chest Portable 1 View  Result Date: 11/12/2016 CLINICAL DATA:  Shortness of breath with cough and dizziness EXAM: PORTABLE CHEST 1 VIEW COMPARISON:  October 24, 2016 FINDINGS: Tracheostomy catheter tip is 6.9 cm above the carina. Pacemaker leads are attached to the right atrium and right ventricle. No pneumothorax. There is no edema or consolidation. Heart is upper normal in size with pulmonary vascularity within normal limits. No adenopathy. There is atherosclerotic calcification in the aorta. IMPRESSION: No edema or consolidation. No pneumothorax. Heart upper normal in size. Aortic  atherosclerosis. Electronically Signed   By: Lowella Grip III M.D.   On: 11/12/2016 13:50    Procedures Procedures (including critical care time)  Medications Ordered in ED Medications  famotidine (PEPCID) IVPB 20 mg premix (not administered)  potassium chloride SA (K-DUR,KLOR-CON) CR tablet 40 mEq (not administered)     Initial Impression / Assessment and Plan / ED Course  I have reviewed the triage vital signs and the nursing notes.  Pertinent labs & imaging results that were available during my care of the patient were reviewed by me and considered in my medical decision making (see chart for details).  Clinical Course    Hgb 10.0 12 days ago.  Stool is + for guaiac.  Pt given iv pepcid.  Pt also hypokalemic.  She was given oral potassium as iv potassium is critically short.  The pt d/w FP for admission.  Pt [facility] be [facility] to [facility] for observation.  Final Clinical Impressions(s) / ED Diagnoses   Final diagnoses:  Hypokalemia  Gastrointestinal hemorrhage, unspecified gastrointestinal hemorrhage type    New Prescriptions New Prescriptions   No medications on file     Isla Pence, MD 11/12/16 1541

## 2016-11-12 NOTE — H&P (Signed)
[facility] Admission History and Physical Service Pager: [phone]  Patient name: [name] Medical record number: [id number] Date of birth: 08-09-1946 Age: 70 y.o. Gender: female  Primary Care Provider: Steve Rattler, DO Consultants: GI Code Status: full  Chief Complaint: weakness  Assessment and Plan: Spirit Wernli is a 70 y.o. female presenting with weakness . PMH is significant for anemia, CKD, HTN, CHF, T2DM, anxiety, HLD  Anemia- has had history of chronic GI blood loss secondary to gastric AVMs. Takes 325mg  ferrous sulfate BID as outpatient. Has required blood and iron transfusions in the past. Hemoglobin at time of discharge 10, today noted to be 8.3. Positive FOBT in ED. No tarry stools or bright red blood per rectum reported. Last colonoscopy per patient was 2-3 years ago with several polyps removed. Follows with [facility] and was last seen 06/2013.  -admit to med-surg for observation, attending Dr. Mingo Amber -vitals per floor -will consult GI, history of chronic blood loss due to gastric AVM -repeat CBC this evening, transfusion threshold <8 -repeat CBC, BMP in morning -NPO at midnight in anticipation of GI procedure likely tomorrow -continue home iron -cardiac monitoring -continue iron supplementation -bowel regimen with iron   Weakness - Subjective in nature. Patient with good strength on exam.  -consult PT/OT -orthostatic vitals  Hypotension, in setting of diagnosis of hypertension- noted to be 95/70 by home health nurse today, 101/64 in ED. Patient taking 20mg  lasix as outpatient. -hold home lasix -monitor blood pressure -fluids as needed, will hold off for now as patient is taking good PO  Hypokalemia . Potassium 2.4 on admit. Most likely 2/2 diuretic use. Not on supplementation as an outpatient.  -repleted in ED with 30meq -recheck K this evening and then in AM  COPD- patient with wheezing auscultated on  admission -albuterol nebulizer now in ED -albuterol neb q6 as needed  Trach- received during recent hospitalization, has been having difficulty taking care of her trach at home -trach care per nursing -reinforce trach teaching for patient  Atrial fibrillation, chronic, rate controlled- patient with pacemaker, takes 81 mg aspirin daily. Not on anticoagulation due to bleed risk.  -continue aspirin  CHF- takes lasix 20mg  daily. Last echo November 2017 with EF 55-60% demonstrated cardiomyopathy -holding home lasix due to low blood pressure  CKD stage 3- creatinine on admission 1.04, at her baseline. -monitor creatinine -avoid nephrotoxic medications  HLD, Chronic: -Continue home Crestor 20mg    Anxiety- stable -continue home xanax at night  Type 2 diabetes, new diagnosis- not currently on any medication. A1c 6.9% on 11/1. CBG 137 on admission.  -CBG AC/HS -sensitive SSI  FEN/GI: heart healthy/carb modified diet, NPO at midnight, Pepcid Prophylaxis: SCDs in setting of bleed  Disposition: admit for observation  History of Present Illness:  [name] is a 70 y.o. female presenting with weakness.   Was recently discharged from hospital with new tracheostomy. Has been doing well, has some difficulty caring for trach but home health nurse has been coming and helping her. Beginning yesterday has felt weak, like she was going to pass out. Was having difficulty walking around room, would have to sit to rest. Reports some light headedness and feelings of heart fluttering. Her home health nurse checked her BP and it was low yesterday. Today she felt worse so called PCP office and was told to come to ED. She has not fallen, she felt like she was going to pass out so she sat down. She feels better when laying  down. She denies fevers, chills, nausea, vomiting. Has not noticed any blood in her stool. Has a long history of anemia. She has been eating and drinking okay. Still feels weak  since coming into ED.  Review Of Systems: Per HPI with the following additions:  Review of Systems  Constitutional: Negative for chills and fever.  Eyes: Negative for blurred vision.  Respiratory: Negative for cough and shortness of breath.   Cardiovascular: Negative for chest pain.  Gastrointestinal: Positive for abdominal pain and diarrhea. Negative for blood in stool, melena, nausea and vomiting.  Genitourinary: Positive for frequency. Negative for dysuria.  Musculoskeletal: Negative for falls.  Neurological: Positive for dizziness and headaches.    Patient Active Problem List   Diagnosis Date Noted  . Diabetes mellitus, type 2 ([place]) 11/05/2016  . 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 Laryngeal stridor   . Respiratory distress   . Acute kidney injury superimposed on chronic kidney disease ([place])   . Permanent atrial fibrillation ([employer])   . Laryngeal stenosis 09/18/2016  . Elevated troponin   . SOB (shortness of breath) 08/17/2016  . Acute encephalopathy   . Esophageal abnormality   . Endotracheal tube present   . Somnolence   . Respiratory failure requiring intubation ([place]) 07/11/2016  . Persistent atrial fibrillation ([place]) 07/10/2016  . Acute respiratory failure with hypoxia ([place])   . Hypotension due to drugs   . Demand ischemia ([place])   . Chest discomfort 02/05/2016  . COPD exacerbation ([employer])   . Palpitations   . Chest pain 11/25/2015  . Dyspnea on exertion   . Polymyalgia rheumatica ([place]) 04/10/2015  . Low back pain 03/28/2015  . Hypertrophic cardiomyopathy ([place]) 02/15/2015  . Eczema 09/18/2014  . Environmental allergies 09/18/2014  . Pre-diabetes 11/26/2013  . COPD, moderate ([place]) 11/01/2013  . Chronic renal failure, stage 3 (moderate) 08/28/2012  . Preventative health care 08/18/2012  . Complete heart block ([place]) 04/03/2011  . Anemia 02/17/2011  . AVM (arteriovenous malformation) 02/13/2011  . PACEMAKER-[facility] 11/28/2010  . CHRONIC KIDNEY DISEASE STAGE III  (MODERATE) 09/24/2010  . Acute on chronic congestive heart failure ([place]) 08/30/2010  . ARTHRITIS 07/24/2010  . Generalized anxiety disorder 07/23/2010  . Atrial fibrillation ([place]) 07/16/2010  . Hyperlipidemia 02/11/2007  . Essential hypertension 02/11/2007  . APNEA, SLEEP 02/11/2007    Past Medical History: Past Medical History:  Diagnosis Date  . Anemia   . Angina   . Angioedema    2/2 ACE  . Arteriovenous malformation of stomach   . Arthritis   . Asthma   . AVM (arteriovenous malformation) of colon    small intestine; stomach  . Blood transfusion   . CHF (congestive heart failure) ([place])   . Complete heart block ([place])    s/p PPM 1998  . Complication of anesthesia    Difficult airway; anaphylaxis and swelling with propofol  . DDD (degenerative disc disease)   . Depression   . Diastolic heart failure   . Fatty liver 07/26/10  . GERD (gastroesophageal reflux disease)   . GI bleed   . Headache   . History of alcohol abuse Stopped Fall 2012  . History of tobacco use Quit Fall 2012  . Hx of cardiovascular stress test    a. Lexiscan Myoview (10/15):  Small inferolateral and apical defect c/w scar and poss soft tissue attenuation, no ischemia, EF 42%  . Hx of colonic polyp 08/13/10   adenomatous  . Hx of colonoscopy   . Hyperlipidemia   . Hyperlipidemia   .  Hypertension   . Hypertrophic cardiomyopathy ([place])    dx by Dr [name] 2009  . Iron deficiency anemia   . Panic attack   . Panic attacks   . Permanent atrial fibrillation ([place])   . Pneumonia   . Renal failure    baseline creatinine 1.6  . Right arm pain 01/08/2012  . RLS (restless legs syndrome)    Dx 06/2007  . Shortness of breath    sob on exertation  . Sleep apnea     Past Surgical History: Past Surgical History:  Procedure Laterality Date  . CARDIAC CATHETERIZATION    . CARDIAC CATHETERIZATION N/A 08/19/2016   Procedure: Right/Left Heart Cath and Coronary Angiography;  Surgeon: Jolaine Artist, MD;   Location: [facility];  Service: Cardiovascular;  Laterality: N/A;  . DIRECT LARYNGOSCOPY N/A 09/18/2016   Procedure: DIRECT LARYNGOSCOPY, BRONCHOSCOPY, REMOVAL OF INTUBATION GRANULOMA;  Surgeon: [name], MD;  Location: [facility] OR;  Service: ENT;  Laterality: N/A;  . DIRECT LARYNGOSCOPY N/A 10/20/2016   Procedure: EXTUBATION AND FLEXIBLE LARYNGOSCOPE;  Surgeon: [name], MD;  Location: [facility];  Service: ENT;  Laterality: N/A;  . DIRECT LARYNGOSCOPY N/A 10/29/2016   Procedure: DIRECT LARYNGOSCOPY;  Surgeon: [name], MD;  Location[facility];  Service: ENT;  Laterality: N/A;  . ESOPHAGOGASTRODUODENOSCOPY  12/23/2011   Procedure: ESOPHAGOGASTRODUODENOSCOPY (EGD);  Surgeon: [name], MD;  Location: [facility];  Service: Endoscopy;  Laterality: N/A;  . EXTUBATION (ENDOTRACHEAL) IN OR N/A 07/21/2016   Procedure: EXTUBATION (ENDOTRACHEAL) IN OR;  Surgeon: [name], MD;  Location: [facility];  Service: ENT;  Laterality: N/A;  . GIVENS CAPSULE STUDY  12/23/2011   Procedure: GIVENS CAPSULE STUDY;  Surgeon: [name], MD;  Location: [facility];  Service: Endoscopy;  Laterality: N/A;  . MICROLARYNGOSCOPY WITH LASER N/A 10/16/2016   Procedure: MICRODIRECTLARYNGOSCOPY WITH LASER ABLATION AND KENLOG INJECTION;  Surgeon: [name], MD;  Location: [place] [facility];  Service: ENT;  Laterality: N/A;  . PACEMAKER INSERTION  1998   st jude, most recent gen change by [name] 4/12  . TRACHEOSTOMY TUBE PLACEMENT N/A 10/29/2016   Procedure: TRACHEOSTOMY;  Surgeon: [name], MD;  Location: [facility] OR;  Service: ENT;  Laterality: N/A;  . TUBAL LIGATION  04/01/2000    Social History: Social History  Substance Use Topics  . Smoking status: Former Smoker    Packs/day: 0.10    Years: 50.00    Types: Cigarettes    Quit date: 08/16/2011  . Smokeless tobacco: Never Used     Comment: quit 2012  . Alcohol use 0.0 oz/week     Comment: quit 08/16/2011   Additional social history: lives with son and daughter Please  also refer to relevant sections of EMR.  Family History: Family History  Problem Relation Age of Onset  . Hypertension Mother   . Stroke Mother   . Heart disease Mother   . Aneurysm Mother   . CVA Mother   . Hypertension Father   . Stroke Father   . Heart attack Father   . Alcohol abuse Father   . Hypertension Sister   . Hypertension Brother   . Colon cancer Brother   . Diabetes Sister   . Diabetes Brother   . Cancer Brother   . Heart attack Brother   . Heart disease Sister   . Alcohol abuse Brother     Allergies and Medications: Allergies  Allergen Reactions  . Ace Inhibitors Swelling    ANGIOEDEMA  .  Other Anaphylaxis and Swelling    UNSPECIFIED ANESTHESIA  . Propofol Anaphylaxis and Swelling   No current facility-administered medications on file prior to encounter.    Current Outpatient Prescriptions on File Prior to Encounter  Medication Sig Dispense Refill  . acetaminophen (TYLENOL) 325 MG tablet Take 325 mg by mouth daily as needed (pain).    [name] albuterol (PROVENTIL) (2.5 MG/3ML) 0.083% nebulizer solution Take 3 mLs (2.5 mg total) by nebulization every 6 (six) hours as needed for wheezing or shortness of breath. 150 mL 1  . ALPRAZolam (XANAX) 0.5 MG tablet Take 1 tablet (0.5 mg total) by mouth at bedtime as needed for anxiety. 15 tablet 0  . aspirin EC 81 MG tablet Take 81 mg by mouth every morning.     . bisacodyl (DULCOLAX) 10 MG suppository Place 10 mg rectally daily as needed for mild constipation.     [name] CALCIUM PO Take 1 tablet by mouth once a week.     . ferrous sulfate 325 (65 FE) MG tablet TAKE 1 TABLET BY MOUTH TWICE DAILY WITH MEALS TO KEEP BLOOD COUNT UP (Patient taking differently: Take 325 mg by mouth 2 (two) times daily with a meal. TO KEEP BLOOD COUNT UP) 60 tablet 11  . furosemide (LASIX) 20 MG tablet Take 20 mg by mouth as directed. TAKE 1 TABLET BY MOUTH AS NEEDED FOR WEIGHT GAIN (2 LBS IN 24 HRS OR 5 LBS IN 1 WEEK)    . nitroGLYCERIN (NITROSTAT)  0.4 MG SL tablet Place 1 tablet (0.4 mg total) under the tongue every 5 (five) minutes as needed for chest pain. 25 tablet 1  . omeprazole (PRILOSEC) 20 MG capsule Take 1 capsule (20 mg total) by mouth daily. (Patient taking differently: Take 20 mg by mouth daily as needed (heartburn). ) 30 capsule 6  . polyethylene glycol (MIRALAX / GLYCOLAX) packet Take 17 g by mouth daily. (Patient taking differently: Take 17 g by mouth daily as needed for mild constipation. ) 14 each 0  . rosuvastatin (CRESTOR) 20 MG tablet Take 1 tablet (20 mg total) by mouth daily. 90 tablet 3  . senna (SENOKOT) 8.6 MG tablet Take 1 tablet by mouth at bedtime as needed for constipation.     . trolamine salicylate (ASPERCREME) 10 % cream Apply 1 application topically 2 (two) times daily as needed for muscle pain.       Objective: BP 101/64   Pulse 75   Temp 98.6 F (37 C) (Oral)   Resp 25   Ht 5\' 5"  (1.651 m)   Wt 160 lb (72.6 kg)   SpO2 100%   BMI 26.63 kg/m  Exam: General: elderly lady, laying in bed, in NAD Eyes: EOMI, PERRL ENTM: dry mucous membranes Neck: trach in place Cardiovascular: RRR no murmurs rubs or gallops Respiratory: diffuse expiratory wheezing auscultated, no increased work of breathing Gastrointestinal: +BS, soft, non-tender, non-distended, no masses or organomegaly MSK: no edema or cyanosis Derm: skin warm, dry, no rashes noted Neuro: A&Ox3, no focal deficit, strength 5/5 in upper and lower extremities bilaterally Psych: appropriate mood and affect   Labs and Imaging: Results for orders placed or performed during the hospital encounter of 11/12/16 (from the past 24 hour(s))  Comprehensive metabolic panel     Status: Abnormal   Collection Time: 11/12/16  1:28 PM  Result Value Ref Range   Sodium 136 135 - 145 mmol/L   Potassium 2.4 (LL) 3.5 - 5.1 mmol/L   Chloride 90 (L) 101 -  111 mmol/L   CO2 34 (H) 22 - 32 mmol/L   Glucose, Bld 137 (H) 65 - 99 mg/dL   BUN 6 6 - 20 mg/dL    Creatinine, Ser 1.08 (H) 0.44 - 1.00 mg/dL   Calcium 8.0 (L) 8.9 - 10.3 mg/dL   Total Protein 5.0 (L) 6.5 - 8.1 g/dL   Albumin 2.0 (L) 3.5 - 5.0 g/dL   AST 33 15 - 41 U/L   ALT 26 14 - 54 U/L   Alkaline Phosphatase 76 38 - 126 U/L   Total Bilirubin 1.0 0.3 - 1.2 mg/dL   GFR calc non Af Amer 50 (L) >60 mL/min   GFR calc Af Amer 58 (L) >60 mL/min   Anion gap 12 5 - 15  Troponin I     Status: Abnormal   Collection Time: 11/12/16  1:28 PM  Result Value Ref Range   Troponin I 0.46 (HH) <0.03 ng/mL  CBC with Differential     Status: Abnormal   Collection Time: 11/12/16  1:28 PM  Result Value Ref Range   WBC 5.0 4.0 - 10.5 K/uL   RBC 3.20 (L) 3.87 - 5.11 MIL/uL   Hemoglobin 8.3 (L) 12.0 - 15.0 g/dL   HCT 27.3 (L) 36.0 - 46.0 %   MCV 85.3 78.0 - 100.0 fL   MCH 25.9 (L) 26.0 - 34.0 pg   MCHC 30.4 30.0 - 36.0 g/dL   RDW 16.3 (H) 11.5 - 15.5 %   Platelets 312 150 - 400 K/uL   Neutrophils Relative % 61 %   Neutro Abs 3.0 1.7 - 7.7 K/uL   Lymphocytes Relative 27 %   Lymphs Abs 1.3 0.7 - 4.0 K/uL   Monocytes Relative 12 %   Monocytes Absolute 0.6 0.1 - 1.0 K/uL   Eosinophils Relative 0 %   Eosinophils Absolute 0.0 0.0 - 0.7 K/uL   Basophils Relative 0 %   Basophils Absolute 0.0 0.0 - 0.1 K/uL  Type and screen [place]     Status: None (Preliminary result)   Collection Time: 11/12/16  3:51 PM  Result Value Ref Range   ABO/RH(D) O POS    Antibody Screen PENDING    Sample Expiration 11/15/2016    Ct Head Wo Contrast  Result Date: 11/12/2016 CLINICAL DATA:  Frontal headache and dizziness inch yesterday. EXAM: CT HEAD WITHOUT CONTRAST TECHNIQUE: Contiguous axial images were obtained from the base of the skull through the vertex without intravenous contrast. COMPARISON:  10/21/2016 FINDINGS: Brain: Mild cerebral atrophy is unchanged. There is no evidence of acute cortical infarct, intracranial hemorrhage, mass, midline shift, or extra-axial fluid collection. Vascular:  Mild carotid siphon atherosclerosis. No hyperdense vessel. Skull: No fracture or osseous lesion. Sinuses/Orbits: Chronic bilateral maxillary sinusitis, with increased sinus opacification on the left and decreased opacification on the right compared to the prior study. Small chronic left mastoid effusion is unchanged. Orbits are unremarkable. Other: None. IMPRESSION: 1. No evidence of acute intracranial abnormality. 2. Chronic bilateral maxillary sinusitis, possibly with superimposed acute sinusitis on the left. Electronically Signed   By: Logan Bores M.D.   On: 11/12/2016 14:33   Dg Chest Portable 1 View  Result Date: 11/12/2016 CLINICAL DATA:  Shortness of breath with cough and dizziness EXAM: PORTABLE CHEST 1 VIEW COMPARISON:  October 24, 2016 FINDINGS: Tracheostomy catheter tip is 6.9 cm above the carina. Pacemaker leads are attached to the right atrium and right ventricle. No pneumothorax. There is no edema or consolidation. Heart is  upper normal in size with pulmonary vascularity within normal limits. No adenopathy. There is atherosclerotic calcification in the aorta. IMPRESSION: No edema or consolidation. No pneumothorax. Heart upper normal in size. Aortic atherosclerosis. Electronically Signed   By: Lowella Grip III M.D.   On: 11/12/2016 13:50    Steve Rattler, DO 11/12/2016, 4:31 PM PGY-1, [place] Intern pager: [phone], text pages welcome  FPTS Upper-Level Resident Addendum  I have independently interviewed and examined the patient. I have discussed the above with the original author and agree with their documentation. My edits for correction/addition/clarification are in pink. Please see also any attending notes.   Katheren Shams, DO PGY-3, [place] Service pager: [phone] (text pages welcome through [facility])

## 2016-11-12 NOTE — ED Notes (Signed)
EDP aware of critical lab values of Troponin 0.46 and Potassium 2.4.

## 2016-11-12 NOTE — Telephone Encounter (Signed)
[name] from [facility] left a message on the medical records line that pts bp was low.  95/70. She didn't request a return call

## 2016-11-12 NOTE — Progress Notes (Signed)
Patient had near-syncopal episode after sitting up to bedside commode. Patient states she became lightheaded and fell back into the bed. Patient states she did not hit her head or any part of her body. Vital signs obtained and are WDL. Notified Family Medicine Resident of episode. No further orders given at this time. Will continue to monitor patient. ED nurse performed NIH scale in room. Bed alarm turned on; informed patient to call nurse or nurse tech if she needs anything and not to get out of bed without assistance.

## 2016-11-12 NOTE — Progress Notes (Signed)
Inpatient Service and Attending for [facility] Medicine Residency text paged that patient has arrived to the unit.

## 2016-11-12 NOTE — Telephone Encounter (Signed)
Will forward to MD to make aware. Zohair Epp,CMA  

## 2016-11-12 NOTE — ED Notes (Addendum)
Admitting MD at bedside.

## 2016-11-12 NOTE — ED Notes (Signed)
Pt requested po meds given in  Apple sauce. Same done.

## 2016-11-13 ENCOUNTER — Other Ambulatory Visit: Payer: Self-pay

## 2016-11-13 ENCOUNTER — Inpatient Hospital Stay: Payer: Medicare Other | Admitting: Family Medicine

## 2016-11-13 ENCOUNTER — Telehealth: Payer: Self-pay | Admitting: Family Medicine

## 2016-11-13 DIAGNOSIS — E876 Hypokalemia: Secondary | ICD-10-CM | POA: Diagnosis not present

## 2016-11-13 DIAGNOSIS — D500 Iron deficiency anemia secondary to blood loss (chronic): Secondary | ICD-10-CM | POA: Diagnosis not present

## 2016-11-13 DIAGNOSIS — D638 Anemia in other chronic diseases classified elsewhere: Secondary | ICD-10-CM | POA: Diagnosis not present

## 2016-11-13 DIAGNOSIS — R195 Other fecal abnormalities: Secondary | ICD-10-CM | POA: Diagnosis not present

## 2016-11-13 LAB — TROPONIN I
Troponin I: 0.47 ng/mL (ref <0.03)
Troponin I: 0.43 ng/mL (ref <0.03)

## 2016-11-13 LAB — TYPE AND SCREEN
Blood Product Expiration Date: 201712182359
ISSUE DATE / TIME: 201711292258
UNIT TYPE AND RH: 5100

## 2016-11-13 LAB — BASIC METABOLIC PANEL WITH GFR
Anion gap: 11 (ref 5–15)
BUN: 5 mg/dL — ABNORMAL LOW (ref 6–20)
CO2: 34 mmol/L — ABNORMAL HIGH (ref 22–32)
Calcium: 7.9 mg/dL — ABNORMAL LOW (ref 8.9–10.3)
Chloride: 94 mmol/L — ABNORMAL LOW (ref 101–111)
Creatinine, Ser: 1.1 mg/dL — ABNORMAL HIGH (ref 0.44–1.00)
GFR calc Af Amer: 57 mL/min — ABNORMAL LOW (ref >60)
GFR calc non Af Amer: 49 mL/min — ABNORMAL LOW (ref >60)
Glucose, Bld: 113 mg/dL — ABNORMAL HIGH (ref 65–99)
Potassium: 2.8 mmol/L — ABNORMAL LOW (ref 3.5–5.1)
Sodium: 139 mmol/L (ref 135–145)

## 2016-11-13 LAB — BASIC METABOLIC PANEL
Anion gap: 9 (ref 5–15)
BUN: 6 mg/dL (ref 6–20)
CALCIUM: 7.9 mg/dL — AB (ref 8.9–10.3)
CHLORIDE: 92 mmol/L — AB (ref 101–111)
CO2: 32 mmol/L (ref 22–32)
CREATININE: 1.11 mg/dL — AB (ref 0.44–1.00)
GFR calc non Af Amer: 48 mL/min — ABNORMAL LOW (ref >60)
GFR, EST AFRICAN AMERICAN: 56 mL/min — AB (ref >60)
Glucose, Bld: 124 mg/dL — ABNORMAL HIGH (ref 65–99)
Potassium: 3.3 mmol/L — ABNORMAL LOW (ref 3.5–5.1)
SODIUM: 133 mmol/L — AB (ref 135–145)

## 2016-11-13 LAB — GLUCOSE, CAPILLARY
GLUCOSE-CAPILLARY: 114 mg/dL — AB (ref 65–99)
GLUCOSE-CAPILLARY: 151 mg/dL — AB (ref 65–99)
Glucose-Capillary: 163 mg/dL — ABNORMAL HIGH (ref 65–99)
Glucose-Capillary: 138 mg/dL — ABNORMAL HIGH (ref 65–99)

## 2016-11-13 LAB — CBC
HEMATOCRIT: 29.4 % — AB (ref 36.0–46.0)
Hemoglobin: 9.3 g/dL — ABNORMAL LOW (ref 12.0–15.0)
MCH: 27 pg (ref 26.0–34.0)
MCHC: 31.6 g/dL (ref 30.0–36.0)
MCV: 85.2 fL (ref 78.0–100.0)
PLATELETS: 273 10*3/uL (ref 150–400)
RBC: 3.45 MIL/uL — AB (ref 3.87–5.11)
RDW: 15.9 % — ABNORMAL HIGH (ref 11.5–15.5)
WBC: 5.2 10*3/uL (ref 4.0–10.5)

## 2016-11-13 LAB — MAGNESIUM
MAGNESIUM: 1.2 mg/dL — AB (ref 1.7–2.4)
Magnesium: 2.1 mg/dL (ref 1.7–2.4)

## 2016-11-13 MED ORDER — POTASSIUM CHLORIDE CRYS ER 20 MEQ PO TBCR
40.0000 meq | EXTENDED_RELEASE_TABLET | Freq: Two times a day (BID) | ORAL | Status: AC
  Administered 2016-11-13 (×2): 40 meq via ORAL
  Filled 2016-11-13 (×2): qty 2

## 2016-11-13 MED ORDER — MAGNESIUM SULFATE 50 % IJ SOLN
2.0000 g | Freq: Once | INTRAMUSCULAR | Status: DC
  Filled 2016-11-13: qty 4

## 2016-11-13 MED ORDER — ENSURE ENLIVE PO LIQD
237.0000 mL | Freq: Two times a day (BID) | ORAL | Status: DC
  Administered 2016-11-14 (×2): 237 mL via ORAL

## 2016-11-13 MED ORDER — MAGNESIUM SULFATE 2 GM/50ML IV SOLN
2.0000 g | Freq: Once | INTRAVENOUS | Status: AC
  Administered 2016-11-13: 2 g via INTRAVENOUS
  Filled 2016-11-13: qty 50

## 2016-11-13 NOTE — Telephone Encounter (Signed)
Diane would like verbal orders for OT once a week for one week and then twice a week for four weeks. Please advise. Thanks! ep

## 2016-11-13 NOTE — Progress Notes (Signed)
Blood transfusion completed at 0200. CBC already ordered for 0500.

## 2016-11-13 NOTE — Patient Outreach (Signed)
[facility]) Care Management  11/13/2016  [name] 12-May-1946 [id number]   RNCM arrived at patient's home for initial home visit. There was no answer to knocks at door. After several attempts at knocking, RNCM made call to patient's home number. Female answered the telephone, advised this RNCM his mother was admitted to hospital last night.  Plan: Attempt to re-engage patient for community care coordination once she is discharged from acute care.

## 2016-11-13 NOTE — Progress Notes (Signed)
CRITICAL VALUE ALERT  Critical value received:  Potassium 2.4  Date of notification:  11/12/16  Time of notification:  2056  Critical value read back:Yes.    Nurse who received alert:  Judge Stall, RN  MD notified (1st page):  Family Medicine Resident  Time of first page:  2110  MD notified (2nd page):  Time of second page:  Responding MD:  Family Medicine Resident  Time MD responded:  2120

## 2016-11-13 NOTE — Discharge Summary (Signed)
[facility] Discharge Summary  Patient name: [name] Medical record number: [id number] Date of birth: [birth year] Age: 70 y.o. Gender: female Date of Admission: 11/12/2016  Date of Discharge: 11/14/16  Admitting Physician: Alveda Reasons, MD  Primary Care Provider: Steve Rattler, DO Consultants: GI  Indication for Hospitalization: Weakness  Discharge Diagnoses/Problem List:  Patient Active Problem List   Diagnosis Date Noted  . Symptomatic anemia 11/12/2016  . Diabetes mellitus, type 2 ([place]) 11/05/2016  . 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 Laryngeal stridor   . Respiratory distress   . Acute kidney injury superimposed on chronic kidney disease ([place])   . Permanent atrial fibrillation ([place])   . Laryngeal stenosis 09/18/2016  . Elevated troponin   . SOB (shortness of breath) 08/17/2016  . Acute encephalopathy   . Esophageal abnormality   . Endotracheal tube present   . Somnolence   . Respiratory failure requiring intubation ([place]) 07/11/2016  . Persistent atrial fibrillation ([place]) 07/10/2016  . Acute respiratory failure with hypoxia ([place])   . Hypotension due to drugs   . Demand ischemia ([place])   . Chest discomfort 02/05/2016  . COPD exacerbation ([place])   . Palpitations   . Chest pain 11/25/2015  . Dyspnea on exertion   . Polymyalgia rheumatica ([place]) 04/10/2015  . Low back pain 03/28/2015  . Hypertrophic cardiomyopathy ([place]) 02/15/2015  . Eczema 09/18/2014  . Environmental allergies 09/18/2014  . Pre-diabetes 11/26/2013  . COPD, moderate ([place]) 11/01/2013  . Chronic renal failure, stage 3 (moderate) 08/28/2012  . Preventative health care 08/18/2012  . Complete heart block ([place]) 04/03/2011  . Anemia 02/17/2011  . AVM (arteriovenous malformation) 02/13/2011  . PACEMAKER-[place] 11/28/2010  . CHRONIC KIDNEY DISEASE STAGE III (MODERATE) 09/24/2010  . Acute on chronic congestive heart failure ([place]) 08/30/2010  . ARTHRITIS 07/24/2010   . Generalized anxiety disorder 07/23/2010  . Atrial fibrillation ([place]) 07/16/2010  . Hyperlipidemia 02/11/2007  . Essential hypertension 02/11/2007  . APNEA, SLEEP 02/11/2007     Disposition: Home  Discharge Condition: Stable  Discharge Exam:  General: NAD, rests comfortably in her chair, trach in place clean and dry Cardiovascular: RRR, no m/r/g Respiratory: CTAB no W/R/R Abdomen: Soft, nontender, nondistended, normoactive BS Extremities: warm and well-perfused  Brief Hospital Course:  Patient is a 70 year old female with a PMH of CHF, GERD, hx ETOH, AFib, CKD, angioedema with chronic upper airway narrowing after an intubation with recent laryngoscopy by ENT who was previously admitted for trach placement and discharged on 11/04/2016.   One week later, the patient presented with complaints of vague fatigue and feeling as though she may pass out. No syncopal episodes, just felt weak.  She was found to have a +FOBT per report from ED and was anemic to 8.3.  Patient does have history of gastric AVM and previously followed with GI.  The patient was admitted and transfused 1uPRBC.  Her Hgb improved to 9.3 after transfusion, and remained stable throughout duration of stay. Her symptoms also improved.  GI saw the patient while she was hospitalized and their repeat FOBT was negative. They recommended continuing her PPI long term, continuing her iron, and following up out patient. No intervention required this hospitalization.  While she was hospitalized she underwent further trach education by nursing for cleaning and caring for the trach. She was discharged with home health assistance or trach care.  Issues for Follow Up:  1. Patient should follow up with GI as outpatient for history of gastric AVM,  although GI recommended no follow up from weakness standpoint.  2. Patient discharged with home health care for trach care.  3. Consider further anemia workup with B12 and folate studies.   4. Recheck BMP and weight. Sent patient with lasix 20mg  QD with Kdur 74meQ daily until hospital follow up appointment. Consider resuming prior to admission med of 20mg  Lasix PRN for weight gain, could consider adding kdur PRN with each lasix dose.   Significant Procedures: none  Significant Labs and Imaging:   Recent Labs Lab 11/12/16 1328 11/12/16 2026 11/13/16 0414 11/14/16 0435  WBC 5.0  --  5.2 4.9  HGB 8.3* 8.1* 9.3* 9.4*  HCT 27.3* 26.0* 29.4* 30.4*  PLT 312  --  273 271    Recent Labs Lab 11/12/16 1328  11/13/16 0414 11/13/16 1452 11/13/16 2247 11/14/16 0435  NA 136  --  139  --  133* 137  K 2.4*  < > 2.8*  --  3.3* 3.5  CL 90*  --  94*  --  92* 95*  CO2 34*  --  34*  --  32 30  GLUCOSE 137*  --  113*  --  124* 108*  BUN 6  --  5*  --  6 6  CREATININE 1.08*  --  1.10*  --  1.11* 1.20*  CALCIUM 8.0*  --  7.9*  --  7.9* 8.3*  MG  --   --  1.2* 2.1  --   --   ALKPHOS 76  --   --   --   --   --   AST 33  --   --   --   --   --   ALT 26  --   --   --   --   --   ALBUMIN 2.0*  --   --   --   --   --   < > = values in this interval not displayed.   Results/Tests Pending at Time of Discharge: none  Discharge Medications:    Medication List    STOP taking these medications   senna 8.6 MG tablet Commonly known as:  SENOKOT     TAKE these medications   acetaminophen 325 MG tablet Commonly known as:  TYLENOL Take 325 mg by mouth daily as needed (pain).   albuterol (2.5 MG/3ML) 0.083% nebulizer solution Commonly known as:  PROVENTIL Take 3 mLs (2.5 mg total) by nebulization every 6 (six) hours as needed for wheezing or shortness of breath.   ALPRAZolam 0.5 MG tablet Commonly known as:  XANAX Take 1 tablet (0.5 mg total) by mouth at bedtime as needed for anxiety.   aspirin EC 81 MG tablet Take 81 mg by mouth every morning.   CALCIUM PO Take 1 tablet by mouth once a week.   DULCOLAX 10 MG suppository Generic drug:  bisacodyl Place 10 mg rectally  daily as needed for mild constipation.   feeding supplement (ENSURE ENLIVE) Liqd Take 237 mLs by mouth 2 (two) times daily between meals. Start taking on:  11/15/2016   ferrous sulfate 325 (65 FE) MG tablet TAKE 1 TABLET BY MOUTH TWICE DAILY WITH MEALS TO KEEP BLOOD COUNT UP What changed:  how much to take  how to take this  when to take this  additional instructions   furosemide 20 MG tablet Commonly known as:  LASIX Take 1 tablet (20 mg total) by mouth as directed. Until 12/5, take 1 tablet EVERY DAY. Then, TAKE  1 TABLET AS NEEDED FOR WEIGHT GAIN What changed:  additional instructions   nitroGLYCERIN 0.4 MG SL tablet Commonly known as:  NITROSTAT Place 1 tablet (0.4 mg total) under the tongue every 5 (five) minutes as needed for chest pain.   omeprazole 20 MG capsule Commonly known as:  PRILOSEC Take 1 capsule (20 mg total) by mouth daily. What changed:  when to take this  reasons to take this   polyethylene glycol packet Commonly known as:  MIRALAX / GLYCOLAX Take 17 g by mouth daily. What changed:  when to take this  reasons to take this   potassium chloride 10 MEQ tablet Commonly known as:  K-DUR Take 4 tablets (40 mEq total) by mouth daily.   rosuvastatin 20 MG tablet Commonly known as:  CRESTOR Take 1 tablet (20 mg total) by mouth daily.   trolamine salicylate 10 % cream Commonly known as:  ASPERCREME Apply 1 application topically 2 (two) times daily as needed for muscle pain.       Discharge Instructions: Please refer to Patient Instructions section of EMR for full details.  Patient was counseled important signs and symptoms that should prompt return to medical care, changes in medications, dietary instructions, activity restrictions, and follow up appointments.   Follow-Up Appointments: Follow-up Information    [name], MD. Go on 11/20/2016.   Specialty:  Otolaryngology Why:  1:40pm to check on tracheostomy  Contact information: [address] [place] [address] [phone]        Marjie Skiff, MD. Go on 11/18/2016.   Specialty:  Family Medicine Why:  10:30am for hospital follow up Contact information: [place] [address] [phone]           Sela Hilding, MD 11/14/2016, 3:41 PM PGY-1, [place]

## 2016-11-13 NOTE — Care Management Note (Addendum)
Case Management Note  Patient Details  Name: [name] MRN: [id number] Date of Birth: January 17, 1946  Subjective/Objective:                    Action/Plan:  Home health referral for RN,PT,OT, and aide given to [facility] per patient request. Left a message with [facility] office to schedule more trach teaching . Returned call, [facility] can see her tomorrow for teaching.  Also spoke with bedside nurse [name] who will review trach care with patient.  Spoke with patient at bedside, recently discharged to home with trach . Patient's son [name] [phone] lives with patient . Patient already has bedside commode , rollator , shower chair and gets trach supplies through [facility].  Patient active with [facility] HHRN,PT,OT, and aide. Will need resumption of care orders for home health at discharge. Expected Discharge Date:                  Expected Discharge Plan:  [place]  In-House Referral:     Discharge planning Services  CM Consult  Post Acute Care Choice:  Home Health Choice offered to:  Patient  DME Arranged:    DME Agency:     HH Arranged:    [place] Agency:     Status of Service:  In process, will continue to follow  If discussed at Long Length of Stay Meetings, dates discussed:    Additional Comments:  Marilu Favre, RN 11/13/2016, 10:35 AM

## 2016-11-13 NOTE — Care Management Obs Status (Signed)
[place] NOTIFICATION   Patient Details  Name: [name] MRN: [id number] Date of Birth: 1946/04/28   Medicare Observation Status Notification Given:  Yes    Marilu Favre, RN 11/13/2016, 10:33 AM

## 2016-11-13 NOTE — Consult Note (Signed)
Referring Provider:  Dr. Burr Medico, Family Medicine  Primary Care Physician:  Steve Rattler, DO Primary Gastroenterologist:  Dr. [name]  Reason for Consultation:  Anemia and heme positive stool  HPI: [name] is a 70 y.o. female presenting with weakness.   Was recently discharged from hospital with new tracheostomy due to chronic upper airway narrowing.  Has multiple medical problems as listed below.  Started feeling weak a couple of days ago, like she was going to pass out. Was having difficulty walking around room, would have to sit to rest. Reports some light headedness and feelings of heart fluttering. Her home health nurse checked her BP and it was low. Yesterday she felt worse so called PCP office and was told to come to ED. She has not fallen, she felt like she was going to pass out so she sat down. She feels better when laying down. She denies fevers, chills, nausea, vomiting. Has not noticed any blood in her stool or black stools. Has a long history of anemia for which she underwent extensive GI evaluation in the past. She has been eating and drinking okay.  Hgb was 8.1 grams on admission so was transfused with one unit PRBC's and increased to 9.3 grams.  Tends to remain in the 10 gram range.  She takes PPI only prn at home. Not on anticoagulation due to GI blood loss issues/anemia in the past.  Last colonoscopy by Dr. [name] 07/2010 showed four polyps in the colon (a couple were tubular adenomas) and the others were benign colonic mucosa with intramucosal lymphoid aggregates and prolapse change.  Last EGD 12/2011 with gastric AVM and capsule endo was placed.  Capsule showed a couple of small erosions in the proximal small bowel, but no source of bleeding identified.  Visualization was fair to poor.  Has undergone multiple procedures prior to those listed above as well.  Other capsule endoscopies negative or with poor prep/visualization.  Just of note, she has a difficult airway  and had bad reaction to propofol in the past.  Past Medical History:  Diagnosis Date  . Anemia   . Angina   . Angioedema    2/2 ACE  . Arteriovenous malformation of stomach   . Arthritis   . Asthma   . AVM (arteriovenous malformation) of colon    small intestine; stomach  . Blood transfusion   . CHF (congestive heart failure) ([place])   . Complete heart block ([place])    s/p PPM 1998  . Complication of anesthesia    Difficult airway; anaphylaxis and swelling with propofol  . DDD (degenerative disc disease)   . Depression   . Diastolic heart failure   . Fatty liver 07/26/10  . GERD (gastroesophageal reflux disease)   . GI bleed   . Headache   . History of alcohol abuse Stopped Fall 2012  . History of tobacco use Quit Fall 2012  . Hx of cardiovascular stress test    a. Lexiscan Myoview (10/15):  Small inferolateral and apical defect c/w scar and poss soft tissue attenuation, no ischemia, EF 42%  . Hx of colonic polyp 08/13/10   adenomatous  . Hx of colonoscopy   . Hyperlipidemia   . Hyperlipidemia   . Hypertension   . Hypertrophic cardiomyopathy ([place])    dx by Dr [name] 2009  . Iron deficiency anemia   . Panic attack   . Panic attacks   . Permanent atrial fibrillation ([place])   . Pneumonia   . Renal  failure    baseline creatinine 1.6  . Right arm pain 01/08/2012  . RLS (restless legs syndrome)    Dx 06/2007  . Shortness of breath    sob on exertation  . Sleep apnea     Past Surgical History:  Procedure Laterality Date  . CARDIAC CATHETERIZATION    . CARDIAC CATHETERIZATION N/A 08/19/2016   Procedure: Right/Left Heart Cath and Coronary Angiography;  Surgeon: Jolaine Artist, MD;  Location: [facility];  Service: Cardiovascular;  Laterality: N/A;  . DIRECT LARYNGOSCOPY N/A 09/18/2016   Procedure: DIRECT LARYNGOSCOPY, BRONCHOSCOPY, REMOVAL OF INTUBATION GRANULOMA;  Surgeon: [name], MD;  Location: [facility] OR;  Service: ENT;  Laterality: N/A;  . DIRECT LARYNGOSCOPY  N/A 10/20/2016   Procedure: EXTUBATION AND FLEXIBLE LARYNGOSCOPE;  Surgeon: [name], MD;  Location: [facility];  Service: ENT;  Laterality: N/A;  . DIRECT LARYNGOSCOPY N/A 10/29/2016   Procedure: DIRECT LARYNGOSCOPY;  Surgeon: [name], MD;  Location: [facility] OR;  Service: ENT;  Laterality: N/A;  . ESOPHAGOGASTRODUODENOSCOPY  12/23/2011   Procedure: ESOPHAGOGASTRODUODENOSCOPY (EGD);  Surgeon: [name], MD;  Location: [facility];  Service: Endoscopy;  Laterality: N/A;  . EXTUBATION (ENDOTRACHEAL) IN OR N/A 07/21/2016   Procedure: EXTUBATION (ENDOTRACHEAL) IN OR;  Surgeon: [name], MD;  Location: [facility];  Service: ENT;  Laterality: N/A;  . GIVENS CAPSULE STUDY  12/23/2011   Procedure: GIVENS CAPSULE STUDY;  Surgeon: [name], MD;  Location: [facility];  Service: Endoscopy;  Laterality: N/A;  . MICROLARYNGOSCOPY WITH LASER N/A 10/16/2016   Procedure: MICRODIRECTLARYNGOSCOPY WITH LASER ABLATION AND KENLOG INJECTION;  Surgeon: [name], MD;  Location: [facility];  Service: ENT;  Laterality: N/A;  . PACEMAKER INSERTION  1998   st jude, most recent gen change by [name] 4/12  . TRACHEOSTOMY TUBE PLACEMENT N/A 10/29/2016   Procedure: TRACHEOSTOMY;  Surgeon: [name], MD;  Location: [facility];  Service: ENT;  Laterality: N/A;  . TUBAL LIGATION  04/01/2000    Prior to Admission medications   Medication Sig Start Date End Date Taking? Authorizing Provider  acetaminophen (TYLENOL) 325 MG tablet Take 325 mg by mouth daily as needed (pain).   Yes Historical Provider, MD  albuterol (PROVENTIL) (2.5 MG/3ML) 0.083% nebulizer solution Take 3 mLs (2.5 mg total) by nebulization every 6 (six) hours as needed for wheezing or shortness of breath. 08/11/16  Yes Dinah C Ngetich, NP  ALPRAZolam (XANAX) 0.5 MG tablet Take 1 tablet (0.5 mg total) by mouth at bedtime as needed for anxiety. 07/26/16  Yes Domenic Polite, MD  aspirin EC 81 MG tablet Take 81 mg by mouth every morning.    Yes Historical Provider, MD    bisacodyl (DULCOLAX) 10 MG suppository Place 10 mg rectally daily as needed for mild constipation.    Yes Historical Provider, MD  CALCIUM PO Take 1 tablet by mouth once a week.    Yes Historical Provider, MD  ferrous sulfate 325 (65 FE) MG tablet TAKE 1 TABLET BY MOUTH TWICE DAILY WITH MEALS TO KEEP BLOOD COUNT UP Patient taking differently: Take 325 mg by mouth 2 (two) times daily with a meal. TO KEEP BLOOD COUNT UP 08/26/16  Yes Gardiner Rhyme Riccio, DO  furosemide (LASIX) 20 MG tablet Take 20 mg by mouth as directed. TAKE 1 TABLET BY MOUTH AS NEEDED FOR WEIGHT GAIN (2 LBS IN 24 HRS OR 5 LBS IN 1 WEEK)   Yes Historical Provider, MD  nitroGLYCERIN (NITROSTAT) 0.4 MG SL tablet  Place 1 tablet (0.4 mg total) under the tongue every 5 (five) minutes as needed for chest pain. 01/18/15  Yes Eileen Stanford, PA-C  omeprazole (PRILOSEC) 20 MG capsule Take 1 capsule (20 mg total) by mouth daily. Patient taking differently: Take 20 mg by mouth daily as needed (heartburn).  08/26/16  Yes Steve Rattler, DO  polyethylene glycol (MIRALAX / GLYCOLAX) packet Take 17 g by mouth daily. Patient taking differently: Take 17 g by mouth daily as needed for mild constipation.  07/26/16  Yes Domenic Polite, MD  rosuvastatin (CRESTOR) 20 MG tablet Take 1 tablet (20 mg total) by mouth daily. 08/26/16  Yes Steve Rattler, DO  senna (SENOKOT) 8.6 MG tablet Take 1 tablet by mouth at bedtime as needed for constipation.    Yes Historical Provider, MD  trolamine salicylate (ASPERCREME) 10 % cream Apply 1 application topically 2 (two) times daily as needed for muscle pain.    Yes Historical Provider, MD    Current Facility-Administered Medications  Medication Dose Route Frequency Provider Last Rate Last Dose  . acetaminophen (TYLENOL) tablet 650 mg  650 mg Oral Q6H PRN Steve Rattler, DO       Or  . acetaminophen (TYLENOL) suppository 650 mg  650 mg Rectal Q6H PRN Steve Rattler, DO      . albuterol (PROVENTIL) (2.5 MG/3ML)  0.083% nebulizer solution 2.5 mg  2.5 mg Nebulization Q6H PRN Steve Rattler, DO      . ALPRAZolam Duanne Moron) tablet 0.5 mg  0.5 mg Oral QHS PRN Steve Rattler, DO      . bisacodyl (DULCOLAX) suppository 10 mg  10 mg Rectal Daily PRN Steve Rattler, DO      . ferrous sulfate tablet 325 mg  325 mg Oral BID WC Angela C Riccio, DO      . magnesium sulfate (IV Push/IM) injection 2 g  2 g Intravenous Once Everrett Coombe, MD      . nitroGLYCERIN (NITROSTAT) SL tablet 0.4 mg  0.4 mg Sublingual Q5 min PRN Steve Rattler, DO      . pantoprazole (PROTONIX) EC tablet 40 mg  40 mg Oral Daily Steve Rattler, DO      . polyethylene glycol (MIRALAX / GLYCOLAX) packet 17 g  17 g Oral Daily PRN Steve Rattler, DO      . potassium chloride SA (K-DUR,KLOR-CON) CR tablet 40 mEq  40 mEq Oral BID Everrett Coombe, MD      . rosuvastatin (CRESTOR) tablet 20 mg  20 mg Oral Daily Steve Rattler, DO      . senna (SENOKOT) tablet 8.6 mg  1 tablet Oral QHS PRN Steve Rattler, DO        Allergies as of 11/12/2016 - Review Complete 11/12/2016  Allergen Reaction Noted  . Ace inhibitors Swelling 03/15/2008  . Other Anaphylaxis and Swelling 10/15/2016  . Propofol Anaphylaxis and Swelling 07/15/2016    Family History  Problem Relation Age of Onset  . Hypertension Mother   . Stroke Mother   . Heart disease Mother   . Aneurysm Mother   . CVA Mother   . Hypertension Father   . Stroke Father   . Heart attack Father   . Alcohol abuse Father   . Hypertension Sister   . Hypertension Brother   . Colon cancer Brother   . Diabetes Sister   . Diabetes Brother   . Cancer Brother   . Heart attack Brother   .  Heart disease Sister   . Alcohol abuse Brother     Social History   Social History  . Marital status: Divorced    Spouse name: N/A  . Number of children: 4  . Years of education: 9   Occupational History  . disabled Unemployed  . previously- house cleaning    Social History Main Topics  . Smoking status:  Former Smoker    Packs/day: 0.10    Years: 50.00    Types: Cigarettes    Quit date: 08/16/2011  . Smokeless tobacco: Never Used     Comment: quit 2012  . Alcohol use 0.0 oz/week     Comment: quit 08/16/2011  . Drug use: No  . Sexual activity: No   Other Topics Concern  . Not on file   Social History Narrative   On disability for heart failure/pacemaker.     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    4 grown children,  8 grandchildren.     Drinks alcohol a few times a week socially. At one time, the most she reports drinking is about 3 mixed drinks.   Lives with daughter [name] and son. Will be moving 04/2011 to different house because concerned children are taking advantage of her financial situation.    No illicit drugs.   Smokes few cigarettes daily, especially when stressed. Started smoking @ age 45. Quit January 2012 2/2 health but started smoking again 02/2011.       Health Care POA:    Emergency Contact: daughter, Verdene Lennert [phone]   End of Life Plan: gave pt AD info pamphlet   Who lives with you: no one- lives in section 8 housing   Any pets: none   Diet: Pt has a varied diet of protein, starch and vegetables   Exercise: Pt has no regular exercise routine.   Seatbelts: Pt reports wearing seatbelt when in vehicles.    Hobbies: dancing   Review of Systems: Ten point ROS is O/W negative except as mentioned in HPI.  Physical Exam: Vital signs in last 24 hours: Temp:  [98.4 F (36.9 C)-100 F (37.8 C)] 98.8 F (37.1 C) (11/30 0442) Pulse Rate:  [68-77] 75 (11/30 0700) Resp:  [12-25] 18 (11/30 0700) BP: (90-138)/(53-107) 90/70 (11/30 0442) SpO2:  [91 %-100 %] 100 % (11/30 0700) FiO2 (%):  [28 %] 28 % (11/30 0700) Weight:  [160 lb (72.6 kg)] 160 lb (72.6 kg) (11/29 1330) Last BM Date: 11/12/16 General:  Alert, resting comfortably in bed, pleasant and cooperative in NAD Head:  Normocephalic and atraumatic. Eyes:  Sclera clear, no icterus.  Conjunctiva  pink. Ears:  Normal auditory acuity. Mouth:  No deformity or lesions.  Neck:  Trach in place. Lungs:  Some wheezing noted.  No increased WOB.  Heart:  Regular rate and rhythm; no murmurs, clicks, rubs, or gallops. Abdomen:  Soft, non-distended.  BS present.  Non-tender. Rectal:  Light brown heme negative stool.  Large firm external hemorrhoid identified.  Msk:  Symmetrical without gross deformities. Pulses:  Normal pulses noted. Extremities:  Without clubbing or edema. Neurologic:  Alert and oriented x 4;  grossly normal neurologically. Skin:  Intact without significant lesions or rashes. Psych:  Alert and cooperative. Normal mood and affect.  Intake/Output from previous day: 11/29 0701 - 11/30 0700 In: 335 [Blood:335] Out: 600 [Urine:600] Intake/Output this shift: Total I/O In: -  Out: 700 [Urine:700]  Lab Results:  Recent Labs  11/12/16 1328 11/12/16 2026 11/13/16 0414  WBC  5.0  --  5.2  HGB 8.3* 8.1* 9.3*  HCT 27.3* 26.0* 29.4*  PLT 312  --  273   BMET  Recent Labs  11/12/16 1328 11/12/16 2026 11/13/16 0414  NA 136  --  139  K 2.4* 2.4* 2.8*  CL 90*  --  94*  CO2 34*  --  34*  GLUCOSE 137*  --  113*  BUN 6  --  5*  CREATININE 1.08*  --  1.10*  CALCIUM 8.0*  --  7.9*   LFT  Recent Labs  11/12/16 1328  PROT 5.0*  ALBUMIN 2.0*  AST 33  ALT 26  ALKPHOS 76  BILITOT 1.0   Studies/Results: Ct Head Wo Contrast  Result Date: 11/12/2016 CLINICAL DATA:  Frontal headache and dizziness inch yesterday. EXAM: CT HEAD WITHOUT CONTRAST TECHNIQUE: Contiguous axial images were obtained from the base of the skull through the vertex without intravenous contrast. COMPARISON:  10/21/2016 FINDINGS: Brain: Mild cerebral atrophy is unchanged. There is no evidence of acute cortical infarct, intracranial hemorrhage, mass, midline shift, or extra-axial fluid collection. Vascular: Mild carotid siphon atherosclerosis. No hyperdense vessel. Skull: No fracture or osseous lesion.  Sinuses/Orbits: Chronic bilateral maxillary sinusitis, with increased sinus opacification on the left and decreased opacification on the right compared to the prior study. Small chronic left mastoid effusion is unchanged. Orbits are unremarkable. Other: None. IMPRESSION: 1. No evidence of acute intracranial abnormality. 2. Chronic bilateral maxillary sinusitis, possibly with superimposed acute sinusitis on the left. Electronically Signed   By: Logan Bores M.D.   On: 11/12/2016 14:33   Dg Chest Portable 1 View  Result Date: 11/12/2016 CLINICAL DATA:  Shortness of breath with cough and dizziness EXAM: PORTABLE CHEST 1 VIEW COMPARISON:  October 24, 2016 FINDINGS: Tracheostomy catheter tip is 6.9 cm above the carina. Pacemaker leads are attached to the right atrium and right ventricle. No pneumothorax. There is no edema or consolidation. Heart is upper normal in size with pulmonary vascularity within normal limits. No adenopathy. There is atherosclerotic calcification in the aorta. IMPRESSION: No edema or consolidation. No pneumothorax. Heart upper normal in size. Aortic atherosclerosis. Electronically Signed   By: Lowella Grip III M.D.   On: 11/12/2016 13:50   IMPRESSION:  -70 year old female with chronic anemia, Hgb usually in 10 gram range, who had a drop in Hgb to 8 grams and reported heme positive stool.  Had history of extensive GI evaluation for GI blood loss in the past with some gastric AVM's, but no other source found.  Last evaluation 2013.  Not on PPI regularly.  Not on anticoagulation.  Had light brown, heme NEGATIVE stool on my exam today. -Acute on chronic anemia:  Hgb improved after one unit PRBC's. -Trach due to chronic upper airway narrowing and complications related to that -Hypokalemia:  Was 2.4 and now 2.8.  Being replaced by primary service.  PLAN: -Will discuss with Dr. Henrene Pastor regarding any repeat evaluation.  If anything, maybe EGD.  She had a reaction to propofol in the  past. -Otherwise monitor Hgb and transfuse prn. -Should be on PPI daily long-term. -Continue iron supplements, ferrous sulfate 325 mg BID.  ZEHR, JESSICA D.  11/13/2016, 8:52 AM  Pager number [phone]  GI ATTENDING  History, laboratories, prior extensive endoscopy evaluations reviewed. Agree with comprehensive consultation note as outlined above. Patient has chronic anemia which is multifactorial. Presents now with weakness. Significantly hypokalemic on admission. NO evidence for GI bleeding by history or physical exam. Stool Hemoccult  NEGATIVE on GI exam today. Anemia normocytic. No indication for repeat GI evaluations. I recommend that you correct her profound hypokalemia as this may improve her weakness. As well, her blood counts are now near baseline. Iron supplements are okay if there is a component of iron deficiency. Could check iron studies, B12, and folate if not already done. It may be wise to have hematology see her regarding other potential contributors to her anemia that may be corrected. No GI follow-up required. Will sign off.  Docia Chuck. Geri Seminole., M.D. [facility] Division of Gastroenterology

## 2016-11-13 NOTE — Progress Notes (Signed)
Family Medicine Teaching Service Daily Progress Note Intern Pager: [phone]  Patient name: [name] Medical record number: [id number] Date of birth: [birth year] Age: 70 y.o. Gender: female  Primary Care Provider: Steve Rattler, DO Consultants: GI Code Status: Full  Pt Overview and Major Events to Date:  11/30: Admit to FPTS  Assessment and Plan: [name] is a 70 y.o. female presenting with weakness . PMH is significant for anemia, CKD, HTN, CHF, T2DM, anxiety, HLD   Symptomatic Anemia- in setting of hx of chronic GI blood loss secondary to gastric AVMs requiring previous transfusions. FOBT positive this admission.  Hgb 8.3 on admission from 10 at previous discharge, now s/p 1u PRBC overnight. Last colonoscopy per patient was 2-3 years ago with several polyps removed. Follows with [facility] and was last seen 06/2013.  - will call GI today - post transfusion H/H 9.3/29.4 - NPO pending GI recs - continue home iron - monitor on tele  - follow CBC - trend troponin 0.46>0.48>0.47, looks like this may be her baseline  Weakness - Subjective in nature. Patient with good strength on exam.  -consult PT/OT -orthostatic vitals  Hypotension, in setting of diagnosis of hypertension - hypotensive at 90/70, MAP 77 this morning. Patient taking 20mg  lasix as outpatient. -hold home lasix -monitor blood pressure -fluids as needed, will hold off for now as patient is taking good PO  Hypomagnesemia  Mag low at 1.2 today, repleted with 2 mg IV Mag - 2pm mag recheck, replete as needed  Hypokalemia . Potassium 2.8 from 2.4 on admit after repletion. Most likely 2/2 diuretic use. Not on supplementation as an outpatient.  -repleted in ED with 32meq, repeat with 40mg  KDUR x2 -recheck K with PM BMP  COPD- patient with wheezing auscultated on admission -albuterol nebulizer now in ED -albuterol neb q6 as needed  Trach- received during recent hospitalization, has been having  difficulty taking care of her trach at home -trach care per nursing -reinforce trach teaching for patient  Atrial fibrillation, chronic, rate controlled- patient with pacemaker, takes 81 mg aspirin daily. Not on anticoagulation due to bleed risk.  -continue aspirin  CHF- takes lasix 20mg  daily. Last echo November 2017 with EF 55-60% demonstrated cardiomyopathy -holding home lasix due to low blood pressure  CKD stage 3- creatinine on admission 1.04 > 1.10, at her baseline. -monitor creatinine -avoid nephrotoxic medications  HLD, Chronic: -Continue home Crestor 20mg    Anxiety- stable -continue home xanax at night  Type 2 diabetes, new diagnosis- not currently on any medication. A1c 6.9% on 11/1. CBG 137 on admission.  -CBG AC/HS -sensitive SSI  FEN/GI: heart healthy/carb modified diet, NPO at midnight, Pepcid Prophylaxis: SCDs in setting of bleed  Disposition: admit for observation  Subjective:  Patient states she felt weak and light-headed last night but these symptoms are improved this morning. Would like nursing to help her learn more about trach care. No acute events overnight. Denies N/V/D/C. Has not noticed any black or bloody stools.  Objective: Temp:  [98.4 F (36.9 C)-100 F (37.8 C)] 98.8 F (37.1 C) (11/30 0442) Pulse Rate:  [68-77] 75 (11/30 0700) Resp:  [12-25] 18 (11/30 0700) BP: (90-138)/(53-107) 90/70 (11/30 0442) SpO2:  [91 %-100 %] 100 % (11/30 0700) FiO2 (%):  [28 %] 28 % (11/30 0700) Weight:  [160 lb (72.6 kg)] 160 lb (72.6 kg) (11/29 1330) Physical Exam: General: NAD, rests comfortably in her chair, trach in place clean and dry Cardiovascular: RRR, no m/r/g Respiratory: CTA bil no  W/R/R Abdomen: Soft, nontender, nondistended, normoactive BS Extremities: warm and well-perfused  Laboratory:  Recent Labs Lab 11/12/16 1328 11/12/16 2026 11/13/16 0414  WBC 5.0  --  5.2  HGB 8.3* 8.1* 9.3*  HCT 27.3* 26.0* 29.4*  PLT 312  --  273     Recent Labs Lab 11/12/16 1328 11/12/16 2026 11/13/16 0414  NA 136  --  139  K 2.4* 2.4* 2.8*  CL 90*  --  94*  CO2 34*  --  34*  BUN 6  --  5*  CREATININE 1.08*  --  1.10*  CALCIUM 8.0*  --  7.9*  PROT 5.0*  --   --   BILITOT 1.0  --   --   ALKPHOS 76  --   --   ALT 26  --   --   AST 33  --   --   GLUCOSE 137*  --  113*      Imaging/Diagnostic Tests:   Everrett Coombe, MD 11/13/2016, 9:42 AM PGY-1, [place] Intern pager: [phone], text pages welcome

## 2016-11-14 ENCOUNTER — Other Ambulatory Visit: Payer: Self-pay | Admitting: Family Medicine

## 2016-11-14 DIAGNOSIS — D500 Iron deficiency anemia secondary to blood loss (chronic): Secondary | ICD-10-CM | POA: Diagnosis not present

## 2016-11-14 LAB — BASIC METABOLIC PANEL
Anion gap: 12 (ref 5–15)
BUN: 6 mg/dL (ref 6–20)
CO2: 30 mmol/L (ref 22–32)
Calcium: 8.3 mg/dL — ABNORMAL LOW (ref 8.9–10.3)
Chloride: 95 mmol/L — ABNORMAL LOW (ref 101–111)
Creatinine, Ser: 1.2 mg/dL — ABNORMAL HIGH (ref 0.44–1.00)
GFR calc Af Amer: 51 mL/min — ABNORMAL LOW (ref >60)
GFR, EST NON AFRICAN AMERICAN: 44 mL/min — AB (ref >60)
GLUCOSE: 108 mg/dL — AB (ref 65–99)
POTASSIUM: 3.5 mmol/L (ref 3.5–5.1)
Sodium: 137 mmol/L (ref 135–145)

## 2016-11-14 LAB — CBC
HCT: 30.4 % — ABNORMAL LOW (ref 36.0–46.0)
Hemoglobin: 9.4 g/dL — ABNORMAL LOW (ref 12.0–15.0)
MCH: 26.3 pg (ref 26.0–34.0)
MCHC: 30.9 g/dL (ref 30.0–36.0)
MCV: 85.2 fL (ref 78.0–100.0)
PLATELETS: 271 10*3/uL (ref 150–400)
RBC: 3.57 MIL/uL — ABNORMAL LOW (ref 3.87–5.11)
RDW: 16.3 % — AB (ref 11.5–15.5)
WBC: 4.9 10*3/uL (ref 4.0–10.5)

## 2016-11-14 LAB — GLUCOSE, CAPILLARY
Glucose-Capillary: 111 mg/dL — ABNORMAL HIGH (ref 65–99)
Glucose-Capillary: 154 mg/dL — ABNORMAL HIGH (ref 65–99)

## 2016-11-14 MED ORDER — ENSURE ENLIVE PO LIQD: 237.0000 mL | Freq: Two times a day (BID) | ORAL | 12 refills | Status: DC

## 2016-11-14 MED ORDER — POTASSIUM CHLORIDE ER 10 MEQ PO TBCR: 40.0000 meq | EXTENDED_RELEASE_TABLET | Freq: Every day | ORAL | 0 refills | Status: DC

## 2016-11-14 MED ORDER — FUROSEMIDE 20 MG PO TABS: 20.0000 mg | ORAL_TABLET | ORAL | 0 refills | Status: DC

## 2016-11-14 NOTE — Progress Notes (Signed)
11/14/2016- Respiratory care note- Pt instructed on trach care.  Pt demonstrated cleaning of the site, changing of the drain sponge, and cleaning of the inner cannula.  Pt given a free-standing mirror to use at home along with basic trach supplies.  Daughter also in the room for instruction.  Trach care booklet left with patient.  Patient and family both very receptive of education.   s Cale Decarolis rrt. rcp

## 2016-11-14 NOTE — Progress Notes (Signed)
Initial Nutrition Assessment  DOCUMENTATION CODES:   Not applicable  INTERVENTION:    Continue Ensure Enlive PO BID, each supplement provides 350 kcal and 20 grams of protein  NUTRITION DIAGNOSIS:   Inadequate oral intake related to poor appetite as evidenced by overall meal completion < 50%, 6% weight loss in the past 3 months.  GOAL:   Patient will meet greater than or equal to 90% of their needs    MONITOR:   PO intake, Supplement acceptance  REASON FOR ASSESSMENT:   Malnutrition Screening Tool    ASSESSMENT:   70 y.o. female presenting with weakness. She was recently discharged from hospital with new tracheostomy due to chronic upper airway narrowing.   Patient is consuming 25-75% of meals at this time. She has lost ~10 lbs over the past 3 months, which is not significant for the time frame, but does reflect a decrease in intake. Patient is receiving Ensure supplements BID. Labs reviewed. Medications reviewed and include ferrous sulfate.  Diet Order:  Diet heart healthy/carb modified Room service appropriate? Yes; Fluid consistency: Thin Diet - low sodium heart healthy  Skin:  Reviewed, no issues  Last BM:  11/29  Height:   Ht Readings from Last 1 Encounters:  11/12/16 5\' 5"  (1.651 m)    Weight:   Wt Readings from Last 1 Encounters:  11/12/16 160 lb (72.6 kg)    Ideal Body Weight:  56.8 kg  BMI:  Body mass index is 26.63 kg/m.  Estimated Nutritional Needs:   Kcal:  1600-1800  Protein:  80-95 gm  Fluid:  1.8 L  EDUCATION NEEDS:   No education needs identified at this time  Molli Barrows, [place], [place], [place] Pager [phone] After Hours Pager [phone]

## 2016-11-14 NOTE — Progress Notes (Signed)
Went over discharge with daughter and patient, removed IV ,Hand out given and instructions on medication pickup and changes on discharge and MD follow up . Patient and family knows to call 37 or get to ER if need to

## 2016-11-17 DIAGNOSIS — N183 Chronic kidney disease, stage 3 (moderate): Secondary | ICD-10-CM | POA: Diagnosis not present

## 2016-11-17 DIAGNOSIS — E1122 Type 2 diabetes mellitus with diabetic chronic kidney disease: Secondary | ICD-10-CM | POA: Diagnosis not present

## 2016-11-17 DIAGNOSIS — D72829 Elevated white blood cell count, unspecified: Secondary | ICD-10-CM | POA: Diagnosis not present

## 2016-11-17 DIAGNOSIS — I509 Heart failure, unspecified: Secondary | ICD-10-CM | POA: Diagnosis not present

## 2016-11-17 DIAGNOSIS — J449 Chronic obstructive pulmonary disease, unspecified: Secondary | ICD-10-CM | POA: Diagnosis not present

## 2016-11-17 DIAGNOSIS — I130 Hypertensive heart and chronic kidney disease with heart failure and stage 1 through stage 4 chronic kidney disease, or unspecified chronic kidney disease: Secondary | ICD-10-CM | POA: Diagnosis not present

## 2016-11-17 DIAGNOSIS — E785 Hyperlipidemia, unspecified: Secondary | ICD-10-CM | POA: Diagnosis not present

## 2016-11-17 DIAGNOSIS — I482 Chronic atrial fibrillation: Secondary | ICD-10-CM | POA: Diagnosis not present

## 2016-11-17 DIAGNOSIS — Z48813 Encounter for surgical aftercare following surgery on the respiratory system: Secondary | ICD-10-CM | POA: Diagnosis not present

## 2016-11-17 DIAGNOSIS — Z7951 Long term (current) use of inhaled steroids: Secondary | ICD-10-CM | POA: Diagnosis not present

## 2016-11-17 DIAGNOSIS — Z430 Encounter for attention to tracheostomy: Secondary | ICD-10-CM | POA: Diagnosis not present

## 2016-11-18 ENCOUNTER — Ambulatory Visit (INDEPENDENT_AMBULATORY_CARE_PROVIDER_SITE_OTHER): Payer: Medicare Other | Admitting: Family Medicine

## 2016-11-18 ENCOUNTER — Encounter: Payer: Self-pay | Admitting: Family Medicine

## 2016-11-18 VITALS — BP 98/62 | HR 70 | Temp 97.9°F | Ht 65.0 in | Wt 148.8 lb

## 2016-11-18 DIAGNOSIS — Z09 Encounter for follow-up examination after completed treatment for conditions other than malignant neoplasm: Secondary | ICD-10-CM

## 2016-11-18 DIAGNOSIS — J449 Chronic obstructive pulmonary disease, unspecified: Secondary | ICD-10-CM | POA: Diagnosis not present

## 2016-11-18 DIAGNOSIS — I509 Heart failure, unspecified: Secondary | ICD-10-CM | POA: Diagnosis not present

## 2016-11-18 DIAGNOSIS — E785 Hyperlipidemia, unspecified: Secondary | ICD-10-CM | POA: Diagnosis not present

## 2016-11-18 DIAGNOSIS — Z430 Encounter for attention to tracheostomy: Secondary | ICD-10-CM | POA: Diagnosis not present

## 2016-11-18 DIAGNOSIS — Z48813 Encounter for surgical aftercare following surgery on the respiratory system: Secondary | ICD-10-CM | POA: Diagnosis not present

## 2016-11-18 DIAGNOSIS — N183 Chronic kidney disease, stage 3 (moderate): Secondary | ICD-10-CM | POA: Diagnosis not present

## 2016-11-18 DIAGNOSIS — D72829 Elevated white blood cell count, unspecified: Secondary | ICD-10-CM | POA: Diagnosis not present

## 2016-11-18 DIAGNOSIS — I130 Hypertensive heart and chronic kidney disease with heart failure and stage 1 through stage 4 chronic kidney disease, or unspecified chronic kidney disease: Secondary | ICD-10-CM | POA: Diagnosis not present

## 2016-11-18 DIAGNOSIS — I482 Chronic atrial fibrillation: Secondary | ICD-10-CM | POA: Diagnosis not present

## 2016-11-18 DIAGNOSIS — Z7951 Long term (current) use of inhaled steroids: Secondary | ICD-10-CM | POA: Diagnosis not present

## 2016-11-18 DIAGNOSIS — E1122 Type 2 diabetes mellitus with diabetic chronic kidney disease: Secondary | ICD-10-CM | POA: Diagnosis not present

## 2016-11-18 NOTE — Patient Instructions (Addendum)
It was great seeing you today! We have addressed the following issues today  1. We have been doing ok since your discharge, your breathing has improved, your weight is down so you will not need to take your furosemide and your potassium (KDUR) anymore. Please take your omeprazole and iron (ferrous sulfate) pill as prescribed. 2. Please follow up with Dr Erik Obey on Thursday November 20, 2016 and with your cardiologist (heart doctor) on December 18.    If we did any lab work today, and the results require attention, either me or my nurse will get in touch with you. If everything is normal, you will get a letter in mail. If you don't hear from [place] in two weeks, please give [place] a call. Otherwise, we look forward to seeing you again at your next visit. If you have any questions or concerns before then, please call the clinic at [phone].   Please bring all your medications to every doctors visit   Sign up for My Chart to have easy access to your labs results, and communication with your Primary care physician.     Please check-out at the front desk before leaving the clinic.    Take Care,

## 2016-11-18 NOTE — Progress Notes (Signed)
  HPI:  [name] presents for hospital follow up. Patient was hospitalized from 11/29 to 12/1 with weakness in the settings of low hemoglobin secondary to a GI bleed.   Patient now reports that she is feeling much better than before she was hospitalized, she feels less tired but still struggling at times with her breathing. Patient recently had her trach replaced to a cuffless one (11/21). Patient has been taking her medications, and does not feel as swollen as before.  ROS: See HPI.  [place]:   Past Medical History:  Diagnosis Date  . Anemia   . Angina   . Angioedema   . Arteriovenous malformation of stomach   . Arthritis   . Asthma   . AVM (arteriovenous malformation) of colon   . Blood transfusion   . CHF (congestive heart failure) ([place])   . Complete heart block ([place])   . Complication of anesthesia   . DDD (degenerative disc disease)   . Depression   . Diastolic heart failure   . Fatty liver 07/26/10  . GERD (gastroesophageal reflux disease)   . GI bleed   . Headache   . History of alcohol abuse Stopped Fall 2012  . History of tobacco use Quit Fall 2012  . Hx of cardiovascular stress test   . Hx of colonic polyp 08/13/10  . Hx of colonoscopy   . Hyperlipidemia   . Hyperlipidemia   . Hypertension   . Hypertrophic cardiomyopathy ([place])   . Iron deficiency anemia   . Panic attack   . Panic attacks   . Permanent atrial fibrillation ([place])   . Pneumonia   . Renal failure   . Right arm pain 01/08/2012  . RLS (restless legs syndrome)   . Shortness of breath   . Sleep apnea     PHYSICAL EXAM: BP 98/62   Pulse 70   Temp 97.9 F (36.6 C) (Oral)   Ht 5\' 5"  (1.651 m)   Wt 148 lb 12.8 oz (67.5 kg)   BMI 24.76 kg/m  Gen: Patient is sitting in her wheel chair, in NAD, able to answer question and participate in exam Cardiac: RRR, normal heart sounds, no murmurs. 2+ radial and PT pulses bilaterally Respiratory: CTAB, normal effort, No wheezes, rales or  rhonchi Abdomen: soft, nontender, nondistended, no hepatic or splenomegaly, +BS Extremities: no edema or cyanosis. WWP. Skin: warm and dry, no rashes noted Neuro: alert and oriented x4, no focal deficits Psych: Normal affect and mood  ASSESSMENT/PLAN:  Patient denies any blood in her stool since discharge. Will hold off on GI follow up while patient continue to improve post discharge. Patient weight is down about 12 lbs since her discharge from the hospitl. Patient was discharged on furosemide 20 mg and Kdur. On exam, patient does not have any extremities swelling or crackles. Respiratory status has improved with patient denying shortness of breath. Patient has not been taking her PPI (omeprazole)  because she is not able to crush pill. We discussed open the capsule --Will stop taking furosemide and potassium and reevaluate volume status at next visit --Resume omeprazole 20 mg by mouth daily in addition to 325 mg ferrous sulfate daily --Follow up on repeat BMP  FOLLOW UP: Follow up with PCP in a few days after pulmonary appointments (12/7) and cardiology (12/18). Consider GI follow up for AVM bleed once patient is more stable.  Will continue to monitor hemoglobin  Marjie Skiff, MD [place]

## 2016-11-19 DIAGNOSIS — I482 Chronic atrial fibrillation: Secondary | ICD-10-CM | POA: Diagnosis not present

## 2016-11-19 DIAGNOSIS — J449 Chronic obstructive pulmonary disease, unspecified: Secondary | ICD-10-CM | POA: Diagnosis not present

## 2016-11-19 DIAGNOSIS — Z7951 Long term (current) use of inhaled steroids: Secondary | ICD-10-CM | POA: Diagnosis not present

## 2016-11-19 DIAGNOSIS — D72829 Elevated white blood cell count, unspecified: Secondary | ICD-10-CM | POA: Diagnosis not present

## 2016-11-19 DIAGNOSIS — N183 Chronic kidney disease, stage 3 (moderate): Secondary | ICD-10-CM | POA: Diagnosis not present

## 2016-11-19 DIAGNOSIS — I509 Heart failure, unspecified: Secondary | ICD-10-CM | POA: Diagnosis not present

## 2016-11-19 DIAGNOSIS — Z430 Encounter for attention to tracheostomy: Secondary | ICD-10-CM | POA: Diagnosis not present

## 2016-11-19 DIAGNOSIS — Z48813 Encounter for surgical aftercare following surgery on the respiratory system: Secondary | ICD-10-CM | POA: Diagnosis not present

## 2016-11-19 DIAGNOSIS — E785 Hyperlipidemia, unspecified: Secondary | ICD-10-CM | POA: Diagnosis not present

## 2016-11-19 DIAGNOSIS — I130 Hypertensive heart and chronic kidney disease with heart failure and stage 1 through stage 4 chronic kidney disease, or unspecified chronic kidney disease: Secondary | ICD-10-CM | POA: Diagnosis not present

## 2016-11-19 DIAGNOSIS — E1122 Type 2 diabetes mellitus with diabetic chronic kidney disease: Secondary | ICD-10-CM | POA: Diagnosis not present

## 2016-11-19 LAB — BASIC METABOLIC PANEL WITH GFR
BUN: 11 mg/dL (ref 7–25)
CALCIUM: 8.8 mg/dL (ref 8.6–10.4)
CO2: 27 mmol/L (ref 20–31)
CREATININE: 1.2 mg/dL — AB (ref 0.60–0.93)
Chloride: 99 mmol/L (ref 98–110)
GFR, EST AFRICAN AMERICAN: 52 mL/min — AB (ref >=60)
GFR, EST NON AFRICAN AMERICAN: 45 mL/min — AB (ref >=60)
GLUCOSE: 82 mg/dL (ref 65–99)
Potassium: 5 mmol/L (ref 3.5–5.3)
Sodium: 136 mmol/L (ref 135–146)

## 2016-11-20 ENCOUNTER — Telehealth: Payer: Self-pay | Admitting: Family Medicine

## 2016-11-20 ENCOUNTER — Telehealth: Payer: Self-pay | Admitting: Internal Medicine

## 2016-11-20 DIAGNOSIS — J449 Chronic obstructive pulmonary disease, unspecified: Secondary | ICD-10-CM | POA: Diagnosis not present

## 2016-11-20 DIAGNOSIS — I509 Heart failure, unspecified: Secondary | ICD-10-CM | POA: Diagnosis not present

## 2016-11-20 NOTE — Telephone Encounter (Signed)
New message  Nurse is asking for verbal orders to see pt for additional visits  Next week 2x week, then taper down on visits  Some home issues  Please call back

## 2016-11-20 NOTE — Telephone Encounter (Signed)
OT with [facility];  Pt cancelled visit today because she had a drs appt today and felt it would be too much.

## 2016-11-20 NOTE — Telephone Encounter (Signed)
[name] to obtain order form PCP as she was admitted under them

## 2016-11-20 NOTE — Telephone Encounter (Signed)
Will forward to MD to make aware. Jazmin Hartsell,CMA  

## 2016-11-21 ENCOUNTER — Other Ambulatory Visit: Payer: Self-pay

## 2016-11-21 DIAGNOSIS — D72829 Elevated white blood cell count, unspecified: Secondary | ICD-10-CM | POA: Diagnosis not present

## 2016-11-21 DIAGNOSIS — Z48813 Encounter for surgical aftercare following surgery on the respiratory system: Secondary | ICD-10-CM | POA: Diagnosis not present

## 2016-11-21 DIAGNOSIS — N183 Chronic kidney disease, stage 3 (moderate): Secondary | ICD-10-CM | POA: Diagnosis not present

## 2016-11-21 DIAGNOSIS — Z430 Encounter for attention to tracheostomy: Secondary | ICD-10-CM | POA: Diagnosis not present

## 2016-11-21 DIAGNOSIS — I130 Hypertensive heart and chronic kidney disease with heart failure and stage 1 through stage 4 chronic kidney disease, or unspecified chronic kidney disease: Secondary | ICD-10-CM | POA: Diagnosis not present

## 2016-11-21 DIAGNOSIS — J449 Chronic obstructive pulmonary disease, unspecified: Secondary | ICD-10-CM | POA: Diagnosis not present

## 2016-11-21 DIAGNOSIS — I482 Chronic atrial fibrillation: Secondary | ICD-10-CM | POA: Diagnosis not present

## 2016-11-21 DIAGNOSIS — Z7951 Long term (current) use of inhaled steroids: Secondary | ICD-10-CM | POA: Diagnosis not present

## 2016-11-21 DIAGNOSIS — E785 Hyperlipidemia, unspecified: Secondary | ICD-10-CM | POA: Diagnosis not present

## 2016-11-21 DIAGNOSIS — I509 Heart failure, unspecified: Secondary | ICD-10-CM | POA: Diagnosis not present

## 2016-11-21 DIAGNOSIS — E1122 Type 2 diabetes mellitus with diabetic chronic kidney disease: Secondary | ICD-10-CM | POA: Diagnosis not present

## 2016-11-21 NOTE — Patient Outreach (Signed)
[facility]) Care Management  11/21/2016   [name] 1946/02/02 [id number]  Subjective:  I cannot talk long, I have this tach  Objective:  Telephonic encounter  Current Medications:  Current Outpatient Prescriptions  Medication Sig Dispense Refill  . acetaminophen (TYLENOL) 325 MG tablet Take 325 mg by mouth daily as needed (pain).    [name] albuterol (PROVENTIL) (2.5 MG/3ML) 0.083% nebulizer solution Take 3 mLs (2.5 mg total) by nebulization every 6 (six) hours as needed for wheezing or shortness of breath. 150 mL 1  . ALPRAZolam (XANAX) 0.5 MG tablet Take 1 tablet (0.5 mg total) by mouth at bedtime as needed for anxiety. 15 tablet 0  . aspirin EC 81 MG tablet Take 81 mg by mouth every morning.     . bisacodyl (DULCOLAX) 10 MG suppository Place 10 mg rectally daily as needed for mild constipation.     [name] CALCIUM PO Take 1 tablet by mouth once a week.     . feeding supplement, ENSURE ENLIVE, (ENSURE ENLIVE) LIQD Take 237 mLs by mouth 2 (two) times daily between meals. 237 mL 12  . ferrous sulfate 325 (65 FE) MG tablet TAKE 1 TABLET BY MOUTH TWICE DAILY WITH MEALS TO KEEP BLOOD COUNT UP (Patient taking differently: Take 325 mg by mouth 2 (two) times daily with a meal. TO KEEP BLOOD COUNT UP) 60 tablet 11  . furosemide (LASIX) 20 MG tablet TAKE 1 TABLET BY MOUTH DAILY UNTIL 12/5 THEN AS NEEDED FOR WEIGHT GAIN THEREAFTER 90 tablet 0  . nitroGLYCERIN (NITROSTAT) 0.4 MG SL tablet Place 1 tablet (0.4 mg total) under the tongue every 5 (five) minutes as needed for chest pain. 25 tablet 1  . omeprazole (PRILOSEC) 20 MG capsule Take 1 capsule (20 mg total) by mouth daily. (Patient taking differently: Take 20 mg by mouth daily as needed (heartburn). ) 30 capsule 6  . polyethylene glycol (MIRALAX / GLYCOLAX) packet Take 17 g by mouth daily. (Patient taking differently: Take 17 g by mouth daily as needed for mild constipation. ) 14 each 0  . potassium chloride (K-DUR) 10 MEQ  tablet TAKE 4 TABLETS BY MOUTH DAILY 450 tablet 0  . rosuvastatin (CRESTOR) 20 MG tablet Take 1 tablet (20 mg total) by mouth daily. 90 tablet 3  . trolamine salicylate (ASPERCREME) 10 % cream Apply 1 application topically 2 (two) times daily as needed for muscle pain.      No current facility-administered medications for this visit.     Functional Status:  In your present state of health, do you have any difficulty performing the following activities: 11/13/2016 10/23/2016  Hearing? N N  Vision? Y N  Difficulty concentrating or making decisions? Tempie Donning  Walking or climbing stairs? Y Y  Dressing or bathing? Y Y  Doing errands, shopping? Y Y  Preparing Food and eating ? - -  Using the Toilet? - -  In the past six months, have you accidently leaked urine? - -  Do you have problems with loss of bowel control? - -  Managing your Medications? - -  Managing your Finances? - -  Housekeeping or managing your Housekeeping? - -  Some recent data might be hidden    Fall/Depression Screening: PHQ 2/9 Scores 11/18/2016 09/29/2016 09/22/2016 08/27/2016 08/26/2016 02/07/2016 12/31/2015  PHQ - 2 Score 0 0 0 0 0 0 0      Assessment:  Patient recently discharged from acute care with a trach, readmitted due to GI bleed. Patient reports  being able to care for [place]. Paitent also reports compliance with medication and medical appointment attendance  Plan:   Home visit later this month to assess community care coordination needs.

## 2016-11-24 DIAGNOSIS — E785 Hyperlipidemia, unspecified: Secondary | ICD-10-CM | POA: Diagnosis not present

## 2016-11-24 DIAGNOSIS — I482 Chronic atrial fibrillation: Secondary | ICD-10-CM | POA: Diagnosis not present

## 2016-11-24 DIAGNOSIS — Z7951 Long term (current) use of inhaled steroids: Secondary | ICD-10-CM | POA: Diagnosis not present

## 2016-11-24 DIAGNOSIS — Z48813 Encounter for surgical aftercare following surgery on the respiratory system: Secondary | ICD-10-CM | POA: Diagnosis not present

## 2016-11-24 DIAGNOSIS — I130 Hypertensive heart and chronic kidney disease with heart failure and stage 1 through stage 4 chronic kidney disease, or unspecified chronic kidney disease: Secondary | ICD-10-CM | POA: Diagnosis not present

## 2016-11-24 DIAGNOSIS — D72829 Elevated white blood cell count, unspecified: Secondary | ICD-10-CM | POA: Diagnosis not present

## 2016-11-24 DIAGNOSIS — Z430 Encounter for attention to tracheostomy: Secondary | ICD-10-CM | POA: Diagnosis not present

## 2016-11-24 DIAGNOSIS — E1122 Type 2 diabetes mellitus with diabetic chronic kidney disease: Secondary | ICD-10-CM | POA: Diagnosis not present

## 2016-11-24 DIAGNOSIS — J449 Chronic obstructive pulmonary disease, unspecified: Secondary | ICD-10-CM | POA: Diagnosis not present

## 2016-11-24 DIAGNOSIS — I509 Heart failure, unspecified: Secondary | ICD-10-CM | POA: Diagnosis not present

## 2016-11-24 DIAGNOSIS — N183 Chronic kidney disease, stage 3 (moderate): Secondary | ICD-10-CM | POA: Diagnosis not present

## 2016-11-25 DIAGNOSIS — I509 Heart failure, unspecified: Secondary | ICD-10-CM | POA: Diagnosis not present

## 2016-11-25 DIAGNOSIS — N183 Chronic kidney disease, stage 3 (moderate): Secondary | ICD-10-CM | POA: Diagnosis not present

## 2016-11-25 DIAGNOSIS — E1122 Type 2 diabetes mellitus with diabetic chronic kidney disease: Secondary | ICD-10-CM | POA: Diagnosis not present

## 2016-11-25 DIAGNOSIS — Z430 Encounter for attention to tracheostomy: Secondary | ICD-10-CM | POA: Diagnosis not present

## 2016-11-25 DIAGNOSIS — D72829 Elevated white blood cell count, unspecified: Secondary | ICD-10-CM | POA: Diagnosis not present

## 2016-11-25 DIAGNOSIS — Z48813 Encounter for surgical aftercare following surgery on the respiratory system: Secondary | ICD-10-CM | POA: Diagnosis not present

## 2016-11-25 DIAGNOSIS — Z7951 Long term (current) use of inhaled steroids: Secondary | ICD-10-CM | POA: Diagnosis not present

## 2016-11-25 DIAGNOSIS — E785 Hyperlipidemia, unspecified: Secondary | ICD-10-CM | POA: Diagnosis not present

## 2016-11-25 DIAGNOSIS — J449 Chronic obstructive pulmonary disease, unspecified: Secondary | ICD-10-CM | POA: Diagnosis not present

## 2016-11-25 DIAGNOSIS — I130 Hypertensive heart and chronic kidney disease with heart failure and stage 1 through stage 4 chronic kidney disease, or unspecified chronic kidney disease: Secondary | ICD-10-CM | POA: Diagnosis not present

## 2016-11-25 DIAGNOSIS — I482 Chronic atrial fibrillation: Secondary | ICD-10-CM | POA: Diagnosis not present

## 2016-11-27 DIAGNOSIS — E1122 Type 2 diabetes mellitus with diabetic chronic kidney disease: Secondary | ICD-10-CM | POA: Diagnosis not present

## 2016-11-27 DIAGNOSIS — J449 Chronic obstructive pulmonary disease, unspecified: Secondary | ICD-10-CM | POA: Diagnosis not present

## 2016-11-27 DIAGNOSIS — Z7951 Long term (current) use of inhaled steroids: Secondary | ICD-10-CM | POA: Diagnosis not present

## 2016-11-27 DIAGNOSIS — E785 Hyperlipidemia, unspecified: Secondary | ICD-10-CM | POA: Diagnosis not present

## 2016-11-27 DIAGNOSIS — I130 Hypertensive heart and chronic kidney disease with heart failure and stage 1 through stage 4 chronic kidney disease, or unspecified chronic kidney disease: Secondary | ICD-10-CM | POA: Diagnosis not present

## 2016-11-27 DIAGNOSIS — I482 Chronic atrial fibrillation: Secondary | ICD-10-CM | POA: Diagnosis not present

## 2016-11-27 DIAGNOSIS — I509 Heart failure, unspecified: Secondary | ICD-10-CM | POA: Diagnosis not present

## 2016-11-27 DIAGNOSIS — Z48813 Encounter for surgical aftercare following surgery on the respiratory system: Secondary | ICD-10-CM | POA: Diagnosis not present

## 2016-11-27 DIAGNOSIS — D72829 Elevated white blood cell count, unspecified: Secondary | ICD-10-CM | POA: Diagnosis not present

## 2016-11-27 DIAGNOSIS — N183 Chronic kidney disease, stage 3 (moderate): Secondary | ICD-10-CM | POA: Diagnosis not present

## 2016-11-27 DIAGNOSIS — Z430 Encounter for attention to tracheostomy: Secondary | ICD-10-CM | POA: Diagnosis not present

## 2016-11-28 ENCOUNTER — Telehealth: Payer: Self-pay | Admitting: *Deleted

## 2016-11-28 ENCOUNTER — Emergency Department (HOSPITAL_COMMUNITY)
Admission: EM | Admit: 2016-11-28 | Discharge: 2016-11-28 | Disposition: A | Discharge status: Home / Self Care | Payer: Medicare Other | Attending: Emergency Medicine | Admitting: Emergency Medicine

## 2016-11-28 ENCOUNTER — Emergency Department (HOSPITAL_COMMUNITY): Payer: Medicare Other

## 2016-11-28 ENCOUNTER — Other Ambulatory Visit: Payer: Self-pay

## 2016-11-28 ENCOUNTER — Encounter (HOSPITAL_COMMUNITY): Payer: Self-pay

## 2016-11-28 DIAGNOSIS — I5033 Acute on chronic diastolic (congestive) heart failure: Secondary | ICD-10-CM | POA: Diagnosis not present

## 2016-11-28 DIAGNOSIS — E1122 Type 2 diabetes mellitus with diabetic chronic kidney disease: Secondary | ICD-10-CM | POA: Diagnosis not present

## 2016-11-28 DIAGNOSIS — Z87891 Personal history of nicotine dependence: Secondary | ICD-10-CM | POA: Diagnosis not present

## 2016-11-28 DIAGNOSIS — R05 Cough: Secondary | ICD-10-CM | POA: Diagnosis not present

## 2016-11-28 DIAGNOSIS — Z950 Presence of cardiac pacemaker: Secondary | ICD-10-CM | POA: Insufficient documentation

## 2016-11-28 DIAGNOSIS — N3000 Acute cystitis without hematuria: Secondary | ICD-10-CM

## 2016-11-28 DIAGNOSIS — N183 Chronic kidney disease, stage 3 (moderate): Secondary | ICD-10-CM | POA: Insufficient documentation

## 2016-11-28 DIAGNOSIS — J449 Chronic obstructive pulmonary disease, unspecified: Secondary | ICD-10-CM | POA: Insufficient documentation

## 2016-11-28 DIAGNOSIS — Z7982 Long term (current) use of aspirin: Secondary | ICD-10-CM | POA: Insufficient documentation

## 2016-11-28 DIAGNOSIS — R531 Weakness: Secondary | ICD-10-CM | POA: Diagnosis present

## 2016-11-28 DIAGNOSIS — I130 Hypertensive heart and chronic kidney disease with heart failure and stage 1 through stage 4 chronic kidney disease, or unspecified chronic kidney disease: Secondary | ICD-10-CM | POA: Diagnosis not present

## 2016-11-28 LAB — BASIC METABOLIC PANEL
ANION GAP: 9 (ref 5–15)
BUN: 6 mg/dL (ref 6–20)
CHLORIDE: 108 mmol/L (ref 101–111)
CO2: 25 mmol/L (ref 22–32)
Calcium: 9.1 mg/dL (ref 8.9–10.3)
Creatinine, Ser: 1.07 mg/dL — ABNORMAL HIGH (ref 0.44–1.00)
GFR calc non Af Amer: 51 mL/min — ABNORMAL LOW (ref >60)
GFR, EST AFRICAN AMERICAN: 59 mL/min — AB (ref >60)
Glucose, Bld: 98 mg/dL (ref 65–99)
POTASSIUM: 4.1 mmol/L (ref 3.5–5.1)
SODIUM: 142 mmol/L (ref 135–145)

## 2016-11-28 LAB — URINALYSIS, ROUTINE W REFLEX MICROSCOPIC
Bilirubin Urine: NEGATIVE
GLUCOSE, UA: NEGATIVE mg/dL
KETONES UR: NEGATIVE mg/dL
Nitrite: NEGATIVE
PROTEIN: NEGATIVE mg/dL
Specific Gravity, Urine: 1.002 — ABNORMAL LOW (ref 1.005–1.030)
pH: 7 (ref 5.0–8.0)

## 2016-11-28 LAB — CBC
HEMATOCRIT: 30.5 % — AB (ref 36.0–46.0)
HEMOGLOBIN: 9.6 g/dL — AB (ref 12.0–15.0)
MCH: 27 pg (ref 26.0–34.0)
MCHC: 31.5 g/dL (ref 30.0–36.0)
MCV: 85.7 fL (ref 78.0–100.0)
PLATELETS: 496 10*3/uL — AB (ref 150–400)
RBC: 3.56 MIL/uL — AB (ref 3.87–5.11)
RDW: 17.6 % — ABNORMAL HIGH (ref 11.5–15.5)
WBC: 5.6 10*3/uL (ref 4.0–10.5)

## 2016-11-28 LAB — CBG MONITORING, ED: GLUCOSE-CAPILLARY: 88 mg/dL (ref 65–99)

## 2016-11-28 MED ORDER — CEPHALEXIN 500 MG PO CAPS: 500.0000 mg | ORAL_CAPSULE | Freq: Two times a day (BID) | ORAL | 0 refills | Status: DC

## 2016-11-28 MED ORDER — MAGNESIUM CITRATE PO SOLN
1.0000 | Freq: Once | ORAL | Status: AC
  Administered 2016-11-28: 1 via ORAL
  Filled 2016-11-28: qty 296

## 2016-11-28 NOTE — ED Notes (Signed)
Patient states she is having weakness but primary complaint is she has notice blood in Rectum when we wipes. Patient states she is not on any blood thinners. Patient states dark stool but is on iron.

## 2016-11-28 NOTE — Discharge Instructions (Signed)
We evaluated you today for cough and dizziness. Your blood work and chest imaging looks good and is not concerning for pneumonia, and your blood levels are stable. We did find that you have a urinary tract infection which could be contributing to your dizziness.   I have prescribed you an antibiotic for your infection. Take one pill of Keflex every 12 hours until you run out of pills.   For your constipation, take stool softeners like dulcolax and miralax daily so that you have a bowel movement every day or every other day.

## 2016-11-28 NOTE — Telephone Encounter (Signed)
Patient came to the nurse clinic this morning. Unfortunately there was no openings for her to be seen. I went in to see patient with Tamika. She C/O rectal and vaginal bleeding that started yesterday. Seems to be mild from her history. Denies other GU or GI symptoms. She has been feeling dizzy since yesterday and this persisted till now.   She endorsed increase mucus production from her trach with cough and chest pain. She stated this is typical of her pneumonia symptoms.  As discussed with patient and her son; given that she was recently discharged from the hospital following complicated medical hx, I recommended ED assessment.  She will need CBC, Bmet and chest xray. She had blood transfusion during last admission. It help to r/o acute anemia following bleeding and now presenting with dizziness.  Patient and her son agreed to go to the ED. I recommended that our staff wheel her over since she appears stable. Patient however insisted that her son will drive her over to the ED.

## 2016-11-28 NOTE — Telephone Encounter (Signed)
Patient brought into clinic by son reporting vaginal/rectal bleeding, dizziness and bad cough.  Patient recently discharge with Trach.  Patient noticed the bleeding yesterday, bright red blood.  Small amount of vaginal and rectal bleeding when she wiped.  Patient also complained of dizziness yesterday.  Precept with Dr. Gwendlyn Deutscher; patient should be seen in ED due recent hospitalization and current symptoms.  Mali, Camera operator at [employer] ED aware that patient will arrive ASAP.  Patient was offered for one of clinic staff at [facility] take her to ED, patient declined and wanted her son "D" to drive her to ED entrance.  Will forward to Dr. Gwendlyn Deutscher.

## 2016-11-28 NOTE — ED Notes (Signed)
Pt ambulatory to restroom

## 2016-11-28 NOTE — ED Notes (Signed)
ED Provider at bedside. 

## 2016-11-28 NOTE — Patient Outreach (Signed)
    Unsuccessful attempt made to contact patient via telephone for community care coordination.  Plan:  Home visit previously scheduled later this month

## 2016-11-28 NOTE — ED Triage Notes (Signed)
Pt. Reports having developed weakness and not feeling well over 1 week ago. She denies any pain.  She does report having intermittent nausea and also she noticed blood in her stool.  She is constipated and used a suppository.  She also has trach.  She is not having any sob at the moment.  Skin is warm and dry ECG completed in triage.  Alert and oriented X4

## 2016-11-28 NOTE — ED Provider Notes (Signed)
[facility] DEPT Provider Note   CSN: [id number] Arrival date & time: 11/28/16  1030   History   Chief Complaint Chief Complaint  Patient presents with  . Weakness    HPI [name] is a 70 y.o. female.  Patient with capped trach, PMH of anemia, GI AVM's, recent hospitalizations requiring RBC transfusions, laryngeal stenosis 2/2 prior intubation, CHF, afib, complete heart block s/p St. Jude Pacemaker presents with one day history of not feeling well, 2 week history of productive cough, and two episodes of dizziness without syncope or fall. Patient was recently discharged from hospital due to respiratory failure 2/2 laryngeal stenosis during which time she had a trach placed. Patient states that a HHRN visits twice a week to clean secretions but family and patient do not regularly clean secretions due to not being comfortable with it. Patient contacted PCP and is requesting more frequent visits from [facility]. Patient has had a productive cough that began about a week after discharge; she denies fevers, chills, shortness of breath or chest pain. She endorses one episode of blood streaks on toilet paper last week with associated constipation. Patient has not had a BM since then (~5 days) and has not taken home stool softeners since last BM. She denies further episodes of bleeding, melena, vaginal bleeding or hematuria.       Past Medical History:  Diagnosis Date  . Anemia   . Angina   . Angioedema    2/2 ACE  . Arteriovenous malformation of stomach   . Arthritis   . Asthma   . AVM (arteriovenous malformation) of colon    small intestine; stomach  . Blood transfusion   . CHF (congestive heart failure) ([place])   . Complete heart block ([place])    s/p PPM 1998  . Complication of anesthesia    Difficult airway; anaphylaxis and swelling with propofol  . DDD (degenerative disc disease)   . Depression   . Diastolic heart failure   . Fatty liver 07/26/10  . GERD (gastroesophageal  reflux disease)   . GI bleed   . Headache   . History of alcohol abuse Stopped Fall 2012  . History of tobacco use Quit Fall 2012  . Hx of cardiovascular stress test    a. Lexiscan Myoview (10/15):  Small inferolateral and apical defect c/w scar and poss soft tissue attenuation, no ischemia, EF 42%  . Hx of colonic polyp 08/13/10   adenomatous  . Hx of colonoscopy   . Hyperlipidemia   . Hyperlipidemia   . Hypertension   . Hypertrophic cardiomyopathy ([place])    dx by Dr [name] 2009  . Iron deficiency anemia   . Panic attack   . Panic attacks   . Permanent atrial fibrillation ([place])   . Pneumonia   . Renal failure    baseline creatinine 1.6  . Right arm pain 01/08/2012  . RLS (restless legs syndrome)    Dx 06/2007  . Shortness of breath    sob on exertation  . Sleep apnea     Patient Active Problem List   Diagnosis Date Noted  . Symptomatic anemia 11/12/2016  . Diabetes mellitus, type 2 ([place]) 11/05/2016  . 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 Laryngeal stridor   . Respiratory distress   . Acute kidney injury superimposed on chronic kidney disease ([place])   . Permanent atrial fibrillation ([place])   . Laryngeal stenosis 09/18/2016  . Elevated troponin   . SOB (shortness of breath) 08/17/2016  . Acute encephalopathy   .  Esophageal abnormality   . Endotracheal tube present   . Somnolence   . Respiratory failure requiring intubation ([place]) 07/11/2016  . Persistent atrial fibrillation ([place]) 07/10/2016  . Acute respiratory failure with hypoxia ([place])   . Hypotension due to drugs   . Demand ischemia ([place])   . Chest discomfort 02/05/2016  . COPD exacerbation ([place])   . Palpitations   . Chest pain 11/25/2015  . Dyspnea on exertion   . Polymyalgia rheumatica ([place]) 04/10/2015  . Low back pain 03/28/2015  . Hypertrophic cardiomyopathy ([place]) 02/15/2015  . Eczema 09/18/2014  . Environmental allergies 09/18/2014  . Pre-diabetes 11/26/2013  . COPD, moderate ([place]) 11/01/2013  . Chronic  renal failure, stage 3 (moderate) 08/28/2012  . Preventative health care 08/18/2012  . Complete heart block ([place]) 04/03/2011  . Anemia 02/17/2011  . AVM (arteriovenous malformation) 02/13/2011  . PACEMAKER-St.Jude 11/28/2010  . CHRONIC KIDNEY DISEASE STAGE III (MODERATE) 09/24/2010  . Acute on chronic congestive heart failure ([place]) 08/30/2010  . ARTHRITIS 07/24/2010  . Generalized anxiety disorder 07/23/2010  . Atrial fibrillation ([place]) 07/16/2010  . Hyperlipidemia 02/11/2007  . Essential hypertension 02/11/2007  . APNEA, SLEEP 02/11/2007    Past Surgical History:  Procedure Laterality Date  . CARDIAC CATHETERIZATION    . CARDIAC CATHETERIZATION N/A 08/19/2016   Procedure: Right/Left Heart Cath and Coronary Angiography;  Surgeon: Jolaine Artist, MD;  Location: [facility];  Service: Cardiovascular;  Laterality: N/A;  . DIRECT LARYNGOSCOPY N/A 09/18/2016   Procedure: DIRECT LARYNGOSCOPY, BRONCHOSCOPY, REMOVAL OF INTUBATION GRANULOMA;  Surgeon: [name], MD;  Location: [facility] OR;  Service: ENT;  Laterality: N/A;  . DIRECT LARYNGOSCOPY N/A 10/20/2016   Procedure: EXTUBATION AND FLEXIBLE LARYNGOSCOPE;  Surgeon: [name], MD;  Location: [facility];  Service: ENT;  Laterality: N/A;  . DIRECT LARYNGOSCOPY N/A 10/29/2016   Procedure: DIRECT LARYNGOSCOPY;  Surgeon: [name], MD;  Location[facility];  Service: ENT;  Laterality: N/A;  . ESOPHAGOGASTRODUODENOSCOPY  12/23/2011   Procedure: ESOPHAGOGASTRODUODENOSCOPY (EGD);  Surgeon: [name], MD;  Location: [facility];  Service: Endoscopy;  Laterality: N/A;  . EXTUBATION (ENDOTRACHEAL) IN OR N/A 07/21/2016   Procedure: EXTUBATION (ENDOTRACHEAL) IN OR;  Surgeon: [name], MD;  Location: [facility];  Service: ENT;  Laterality: N/A;  . GIVENS CAPSULE STUDY  12/23/2011   Procedure: GIVENS CAPSULE STUDY;  Surgeon: [name], MD;  Location: [facility];  Service: Endoscopy;  Laterality: N/A;  . MICROLARYNGOSCOPY WITH LASER N/A 10/16/2016    Procedure: MICRODIRECTLARYNGOSCOPY WITH LASER ABLATION AND KENLOG INJECTION;  Surgeon: [name], MD;  Location: [facility];  Service: ENT;  Laterality: N/A;  . PACEMAKER INSERTION  1998   st jude, most recent gen change by [name] 4/12  . TRACHEOSTOMY TUBE PLACEMENT N/A 10/29/2016   Procedure: TRACHEOSTOMY;  Surgeon: [name], MD;  Location: [facility];  Service: ENT;  Laterality: N/A;  . TUBAL LIGATION  04/01/2000    OB History    No data available       Home Medications    Prior to Admission medications   Medication Sig Start Date End Date Taking? Authorizing Provider  acetaminophen (TYLENOL) 325 MG tablet Take 325 mg by mouth daily as needed (pain).    Historical Provider, MD  albuterol (PROVENTIL) (2.5 MG/3ML) 0.083% nebulizer solution Take 3 mLs (2.5 mg total) by nebulization every 6 (six) hours as needed for wheezing or shortness of breath. 08/11/16   Dinah C Ngetich, NP  ALPRAZolam (XANAX) 0.5 MG tablet Take  1 tablet (0.5 mg total) by mouth at bedtime as needed for anxiety. 07/26/16   Domenic Polite, MD  aspirin EC 81 MG tablet Take 81 mg by mouth every morning.     Historical Provider, MD  bisacodyl (DULCOLAX) 10 MG suppository Place 10 mg rectally daily as needed for mild constipation.     Historical Provider, MD  CALCIUM PO Take 1 tablet by mouth once a week.     Historical Provider, MD  cephALEXin (KEFLEX) 500 MG capsule Take 1 capsule (500 mg total) by mouth 2 (two) times daily. 11/28/16   Alphonzo Grieve, MD  feeding supplement, ENSURE ENLIVE, (ENSURE ENLIVE) LIQD Take 237 mLs by mouth 2 (two) times daily between meals. 11/15/16   Sela Hilding, MD  ferrous sulfate 325 (65 FE) MG tablet TAKE 1 TABLET BY MOUTH TWICE DAILY WITH MEALS TO KEEP BLOOD COUNT UP Patient taking differently: Take 325 mg by mouth 2 (two) times daily with a meal. TO KEEP BLOOD COUNT UP 08/26/16   Steve Rattler, DO  furosemide (LASIX) 20 MG tablet TAKE 1 TABLET BY MOUTH DAILY UNTIL 12/5 THEN AS NEEDED FOR  WEIGHT GAIN THEREAFTER 11/14/16   Steve Rattler, DO  nitroGLYCERIN (NITROSTAT) 0.4 MG SL tablet Place 1 tablet (0.4 mg total) under the tongue every 5 (five) minutes as needed for chest pain. 01/18/15   Eileen [place], PA-C  omeprazole (PRILOSEC) 20 MG capsule Take 1 capsule (20 mg total) by mouth daily. Patient taking differently: Take 20 mg by mouth daily as needed (heartburn).  08/26/16   Steve Rattler, DO  polyethylene glycol (MIRALAX / GLYCOLAX) packet Take 17 g by mouth daily. Patient taking differently: Take 17 g by mouth daily as needed for mild constipation.  07/26/16   Domenic Polite, MD  potassium chloride (K-DUR) 10 MEQ tablet TAKE 4 TABLETS BY MOUTH DAILY 11/14/16   Steve Rattler, DO  rosuvastatin (CRESTOR) 20 MG tablet Take 1 tablet (20 mg total) by mouth daily. 08/26/16   Steve Rattler, DO  trolamine salicylate (ASPERCREME) 10 % cream Apply 1 application topically 2 (two) times daily as needed for muscle pain.     Historical Provider, MD    Family History Family History  Problem Relation Age of Onset  . Hypertension Mother   . Stroke Mother   . Heart disease Mother   . Aneurysm Mother   . CVA Mother   . Hypertension Father   . Stroke Father   . Heart attack Father   . Alcohol abuse Father   . Hypertension Sister   . Hypertension Brother   . Colon cancer Brother   . Diabetes Sister   . Diabetes Brother   . Cancer Brother   . Heart attack Brother   . Heart disease Sister   . Alcohol abuse Brother     Social History Social History  Substance Use Topics  . Smoking status: Former Smoker    Packs/day: 0.10    Years: 50.00    Types: Cigarettes    Quit date: 08/16/2011  . Smokeless tobacco: Never Used     Comment: quit 2012  . Alcohol use 0.0 oz/week     Comment: quit 08/16/2011     Allergies   Ace inhibitors; Other; and Propofol   Review of Systems Review of Systems  Constitutional: Negative for activity change, appetite change, chills, diaphoresis,  fatigue and fever.  HENT: Positive for congestion, postnasal drip and rhinorrhea. Negative for nosebleeds.   Eyes:  Negative for visual disturbance.  Respiratory: Positive for cough. Negative for choking, chest tightness, shortness of breath, wheezing and stridor.   Cardiovascular: Negative for chest pain, palpitations and leg swelling.  Gastrointestinal: Positive for anal bleeding and constipation. Negative for abdominal distention, abdominal pain, blood in stool, diarrhea, nausea, rectal pain and vomiting.  Endocrine: Negative for polyuria.  Genitourinary: Negative for decreased urine volume, difficulty urinating, dysuria, flank pain, hematuria and vaginal bleeding.  Musculoskeletal: Negative for arthralgias and myalgias.  Skin: Negative for pallor and rash.  Neurological: Positive for dizziness, weakness and light-headedness. Negative for syncope, facial asymmetry, numbness and headaches.  Hematological: Negative for adenopathy.  Psychiatric/Behavioral: Negative for agitation, confusion and decreased concentration.     Physical Exam Updated Vital Signs BP 132/71   Pulse 71   Temp 97.2 F (36.2 C) (Oral)   Resp 22   Ht 5\' 5"  (1.651 m)   Wt 67.1 kg   SpO2 100%   BMI 24.63 kg/m   Physical Exam  Constitutional: She is oriented to person, place, and time. She appears well-developed and well-nourished. No distress.  HENT:  Head: Normocephalic and atraumatic.  Right Ear: External ear normal.  Left Ear: External ear normal.  Mouth/Throat: Oropharynx is clear and moist. No oropharyngeal exudate.  Eyes: Conjunctivae and EOM are normal. Pupils are equal, round, and reactive to light. No scleral icterus.  Neck: Normal range of motion. Neck supple. No thyromegaly present.  Cardiovascular: Normal rate and regular rhythm.  Exam reveals no gallop and no friction rub.   No murmur heard. Pulmonary/Chest: Effort normal and breath sounds normal. No stridor. No respiratory distress. She has no  wheezes. She has no rales. She exhibits no tenderness.    Abdominal: Soft. Bowel sounds are normal. She exhibits no distension. There is no tenderness. There is no rebound and no guarding.  Genitourinary: Rectal exam shows external hemorrhoid. Rectal exam shows no fissure, no tenderness and anal tone normal.    Musculoskeletal: Normal range of motion. She exhibits no edema, tenderness or deformity.  Lymphadenopathy:    She has no cervical adenopathy.  Neurological: She is alert and oriented to person, place, and time. No cranial nerve deficit.  Skin: Skin is warm and dry. Capillary refill takes less than 2 seconds. No rash noted. She is not diaphoretic. No erythema.  Psychiatric: She has a normal mood and affect. Her behavior is normal. Judgment and thought content normal.     ED Treatments / Results  Labs (all labs ordered are listed, but only abnormal results are displayed) Labs Reviewed  BASIC METABOLIC PANEL - Abnormal; Notable for the following:       Result Value   Creatinine, Ser 1.07 (*)    GFR calc non Af Amer 51 (*)    GFR calc Af Amer 59 (*)    All other components within normal limits  CBC - Abnormal; Notable for the following:    RBC 3.56 (*)    Hemoglobin 9.6 (*)    HCT 30.5 (*)    RDW 17.6 (*)    Platelets 496 (*)    All other components within normal limits  URINALYSIS, ROUTINE W REFLEX MICROSCOPIC - Abnormal; Notable for the following:    Color, Urine STRAW (*)    Specific Gravity, Urine 1.002 (*)    Hgb urine dipstick SMALL (*)    Leukocytes, UA LARGE (*)    Bacteria, UA RARE (*)    Squamous Epithelial / LPF 0-5 (*)    All  other components within normal limits  URINE CULTURE  CBG MONITORING, ED    EKG  EKG Interpretation  Date/Time:  Friday November 28 2016 10:55:00 EST Ventricular Rate:  70 PR Interval:    QRS Duration: 210 QT Interval:  502 QTC Calculation: 542 R Axis:   -25 Text Interpretation:  Ventricular-paced rhythm Abnormal ECG No  significant change since last tracing Confirmed by ISAACS MD, Lysbeth Galas [phone]) on 11/28/2016 12:42:28 PM       Radiology Dg Chest 2 View  Result Date: 11/28/2016 CLINICAL DATA:  Pt. Reports having developed weakness and not feeling well over 1 week ago. She does report having intermittent nausea, cough and also noticed blood in her stool. She is constipated and used a suppository. She also has trach i.*comment was truncated* EXAM: CHEST  2 VIEW COMPARISON:  Chest radiograph 11/12/2016 FINDINGS: Tracheostomy tube and RIGHT-sided pacemaker unchanged. Stable mild enlarged cardiac silhouette. There is chronic bronchitic markings centrally. No effusion, infiltrate pneumothorax. IMPRESSION: Chronic bronchitic markings.  Cardiomegaly. No acute cardiopulmonary process. Electronically Signed   By: Suzy Bouchard M.D.   On: 11/28/2016 12:58    Procedures Procedures (including critical care time)  Medications Ordered in ED Medications  magnesium citrate solution 1 Bottle (1 Bottle Oral Given 11/28/16 1315)     Initial Impression / Assessment and Plan / ED Course  I have reviewed the triage vital signs and the nursing notes.  Pertinent labs & imaging results that were available during my care of the patient were reviewed by me and considered in my medical decision making (see chart for details).  Clinical Course    [name] is a 70yo female with PMH of truncated trach presence, a fib, complete heart block s/p pacemaker, COPD, CKD stage 3, anemia, GI AVMs, DMT2, and CHF presenting with feelings of dizziness, weakness and cough. On arrival, patient was afebrile, normotensive, normocardic and saturating well on room air. Orthostatic vital signs were unremarkable. Patient was not in distress. Cardiac exam revealed regular rate and rhythm, with intact pulses. Pulmonary exam was negative for wheezing, crackles or rhonchi and patient was breathing comfortably. Abdominal exam was unremarkable.  Rectal exam was negative for frank blood or fissures; it did reveal a polyp vs thrombosed hemorrhoid that was nontender and nonbleeding; she had external hemorrhoids and hard stool in her rectal vault. CBC showed stable anemia with Hgb of 9.6 (9.4 previous), no leukocytosis. BMP showed normal electrolytes and stable CKD. UA was positive for bacteria, and too many to count WBC consistent with UTI. Prior Urine cultures yielded pan-sensitive E coli. CXR was negative for infiltrates or consolidations concerning for pneumonia. FOBT testing was not obtained as patient known to have + FOBT in past with no cause and patient on iron replacement therapy which would yield a positive result.   Patient's symptoms of weakness likely from UTI and cough from trach and inadequate clearing of secretions. Patient to follow up with PCP about increasing [place] for help with trach maintenance. Patient was given Mag Citrate in ED for constipation and advised about stool softener use daily if needed; small amount of bleeding patient visualized on toilet paper a week prior was likely hemorrhoidal vs fissure.   For UTI, urine culture was obtained and patient started on Keflex as she had tolerated this treatment well before.   Strict return precautions discussed and agreed upon by patient such as chest pain, shortness of breath, fever, difficulty urinating, rectal bleeding, further dizziness and syncope. Patient agrees to plan and  has no further questions or concerns.   Final Clinical Impressions(s) / ED Diagnoses   Final diagnoses:  Acute cystitis without hematuria    New Prescriptions New Prescriptions   CEPHALEXIN (KEFLEX) 500 MG CAPSULE    Take 1 capsule (500 mg total) by mouth 2 (two) times daily.     Alphonzo Grieve, MD 11/30/16 1433    Duffy Bruce, MD 11/30/16 [phone]

## 2016-11-28 NOTE — ED Notes (Signed)
Pt family coming to pick her up from ED waiting room. Pt. Wheeled to waiting room to wait. NAD. VSS.

## 2016-11-30 LAB — URINE CULTURE

## 2016-12-01 ENCOUNTER — Telehealth: Payer: Self-pay | Admitting: Internal Medicine

## 2016-12-01 ENCOUNTER — Telehealth (HOSPITAL_BASED_OUTPATIENT_CLINIC_OR_DEPARTMENT_OTHER): Payer: Self-pay | Admitting: Emergency Medicine

## 2016-12-01 ENCOUNTER — Ambulatory Visit (INDEPENDENT_AMBULATORY_CARE_PROVIDER_SITE_OTHER): Payer: Medicare Other | Admitting: *Deleted

## 2016-12-01 DIAGNOSIS — Z7951 Long term (current) use of inhaled steroids: Secondary | ICD-10-CM | POA: Diagnosis not present

## 2016-12-01 DIAGNOSIS — Z48813 Encounter for surgical aftercare following surgery on the respiratory system: Secondary | ICD-10-CM | POA: Diagnosis not present

## 2016-12-01 DIAGNOSIS — I509 Heart failure, unspecified: Secondary | ICD-10-CM | POA: Diagnosis not present

## 2016-12-01 DIAGNOSIS — Z950 Presence of cardiac pacemaker: Secondary | ICD-10-CM | POA: Diagnosis not present

## 2016-12-01 DIAGNOSIS — J449 Chronic obstructive pulmonary disease, unspecified: Secondary | ICD-10-CM | POA: Diagnosis not present

## 2016-12-01 DIAGNOSIS — Z430 Encounter for attention to tracheostomy: Secondary | ICD-10-CM | POA: Diagnosis not present

## 2016-12-01 DIAGNOSIS — I482 Chronic atrial fibrillation: Secondary | ICD-10-CM | POA: Diagnosis not present

## 2016-12-01 DIAGNOSIS — E785 Hyperlipidemia, unspecified: Secondary | ICD-10-CM | POA: Diagnosis not present

## 2016-12-01 DIAGNOSIS — I130 Hypertensive heart and chronic kidney disease with heart failure and stage 1 through stage 4 chronic kidney disease, or unspecified chronic kidney disease: Secondary | ICD-10-CM | POA: Diagnosis not present

## 2016-12-01 DIAGNOSIS — D72829 Elevated white blood cell count, unspecified: Secondary | ICD-10-CM | POA: Diagnosis not present

## 2016-12-01 DIAGNOSIS — E1122 Type 2 diabetes mellitus with diabetic chronic kidney disease: Secondary | ICD-10-CM | POA: Diagnosis not present

## 2016-12-01 DIAGNOSIS — N183 Chronic kidney disease, stage 3 (moderate): Secondary | ICD-10-CM | POA: Diagnosis not present

## 2016-12-01 NOTE — Telephone Encounter (Signed)
Tried to call patient on both numbers.  She was seen in the device clinic today and left an AVS from 11/28/16.  I will leave up front for her

## 2016-12-01 NOTE — Progress Notes (Signed)
Pacemaker check in clinic. Normal device function. Threshold, sensing, impedance consistent with previous measurements. Device programmed to maximize longevity. No high ventricular rates noted. Device programmed at appropriate safety margins. Histogram distribution appropriate for patient activity level. Device programmed to optimize intrinsic conduction. Estimated longevity 7.25 years. ROV w/ JA 05/2017. Patient education completed.

## 2016-12-01 NOTE — Telephone Encounter (Signed)
New message  Pt is calling/thinks she left some papers here at the office  Please call back

## 2016-12-01 NOTE — Telephone Encounter (Signed)
Post ED Visit - Positive Culture Follow-up  Culture report reviewed by antimicrobial stewardship pharmacist:  [x]  Elenor Quinones, Pharm.D. []  Heide Guile, Pharm.D., BCPS []  Parks Neptune, Pharm.D. []  Alycia Rossetti, Pharm.D., BCPS []  [place], Pharm.D., BCPS, AAHIVP []  Legrand Como, Pharm.D., BCPS, AAHIVP []  Milus Glazier, Pharm.D. []  Stephens November, [place].D.  Positive urine culture Treated with cephalexin, organism sensitive to the same and no further patient follow-up is required at this time.  Hazle Nordmann 12/01/2016, 10:22 AM

## 2016-12-02 DIAGNOSIS — I509 Heart failure, unspecified: Secondary | ICD-10-CM | POA: Diagnosis not present

## 2016-12-02 DIAGNOSIS — D649 Anemia, unspecified: Secondary | ICD-10-CM | POA: Diagnosis not present

## 2016-12-02 DIAGNOSIS — Z930 Tracheostomy status: Secondary | ICD-10-CM | POA: Diagnosis not present

## 2016-12-02 DIAGNOSIS — I4891 Unspecified atrial fibrillation: Secondary | ICD-10-CM | POA: Diagnosis not present

## 2016-12-02 DIAGNOSIS — J398 Other specified diseases of upper respiratory tract: Secondary | ICD-10-CM | POA: Diagnosis not present

## 2016-12-03 DIAGNOSIS — E785 Hyperlipidemia, unspecified: Secondary | ICD-10-CM | POA: Diagnosis not present

## 2016-12-03 DIAGNOSIS — N183 Chronic kidney disease, stage 3 (moderate): Secondary | ICD-10-CM | POA: Diagnosis not present

## 2016-12-03 DIAGNOSIS — I130 Hypertensive heart and chronic kidney disease with heart failure and stage 1 through stage 4 chronic kidney disease, or unspecified chronic kidney disease: Secondary | ICD-10-CM | POA: Diagnosis not present

## 2016-12-03 DIAGNOSIS — Z430 Encounter for attention to tracheostomy: Secondary | ICD-10-CM | POA: Diagnosis not present

## 2016-12-03 DIAGNOSIS — Z48813 Encounter for surgical aftercare following surgery on the respiratory system: Secondary | ICD-10-CM | POA: Diagnosis not present

## 2016-12-03 DIAGNOSIS — I482 Chronic atrial fibrillation: Secondary | ICD-10-CM | POA: Diagnosis not present

## 2016-12-03 DIAGNOSIS — Z7951 Long term (current) use of inhaled steroids: Secondary | ICD-10-CM | POA: Diagnosis not present

## 2016-12-03 DIAGNOSIS — D72829 Elevated white blood cell count, unspecified: Secondary | ICD-10-CM | POA: Diagnosis not present

## 2016-12-03 DIAGNOSIS — J449 Chronic obstructive pulmonary disease, unspecified: Secondary | ICD-10-CM | POA: Diagnosis not present

## 2016-12-03 DIAGNOSIS — E1122 Type 2 diabetes mellitus with diabetic chronic kidney disease: Secondary | ICD-10-CM | POA: Diagnosis not present

## 2016-12-03 DIAGNOSIS — I509 Heart failure, unspecified: Secondary | ICD-10-CM | POA: Diagnosis not present

## 2016-12-04 ENCOUNTER — Other Ambulatory Visit: Payer: Self-pay

## 2016-12-04 DIAGNOSIS — Z930 Tracheostomy status: Secondary | ICD-10-CM | POA: Diagnosis not present

## 2016-12-04 DIAGNOSIS — N183 Chronic kidney disease, stage 3 (moderate): Secondary | ICD-10-CM | POA: Diagnosis not present

## 2016-12-04 DIAGNOSIS — E785 Hyperlipidemia, unspecified: Secondary | ICD-10-CM | POA: Diagnosis not present

## 2016-12-04 DIAGNOSIS — E1122 Type 2 diabetes mellitus with diabetic chronic kidney disease: Secondary | ICD-10-CM | POA: Diagnosis not present

## 2016-12-04 DIAGNOSIS — D72829 Elevated white blood cell count, unspecified: Secondary | ICD-10-CM | POA: Diagnosis not present

## 2016-12-04 DIAGNOSIS — Z430 Encounter for attention to tracheostomy: Secondary | ICD-10-CM | POA: Diagnosis not present

## 2016-12-04 DIAGNOSIS — I482 Chronic atrial fibrillation: Secondary | ICD-10-CM | POA: Diagnosis not present

## 2016-12-04 DIAGNOSIS — J449 Chronic obstructive pulmonary disease, unspecified: Secondary | ICD-10-CM | POA: Diagnosis not present

## 2016-12-04 DIAGNOSIS — Z7951 Long term (current) use of inhaled steroids: Secondary | ICD-10-CM | POA: Diagnosis not present

## 2016-12-04 DIAGNOSIS — I509 Heart failure, unspecified: Secondary | ICD-10-CM | POA: Diagnosis not present

## 2016-12-04 DIAGNOSIS — Z48813 Encounter for surgical aftercare following surgery on the respiratory system: Secondary | ICD-10-CM | POA: Diagnosis not present

## 2016-12-04 DIAGNOSIS — I130 Hypertensive heart and chronic kidney disease with heart failure and stage 1 through stage 4 chronic kidney disease, or unspecified chronic kidney disease: Secondary | ICD-10-CM | POA: Diagnosis not present

## 2016-12-04 NOTE — Patient Outreach (Signed)
[name]) Care Management  12/04/2016  [name] November 02, 1946 [id number]   Telephone cals x3 l made to patient to remind her of today's home visit. Call made to remind due to 2 no shows in the past. RNCM received no answer, left HIPPA compliant message with contact information for this RNCM and request to return call  Plan: Make another attempt to contact patient via telephone within the next 30 days.

## 2016-12-09 DIAGNOSIS — Z7951 Long term (current) use of inhaled steroids: Secondary | ICD-10-CM | POA: Diagnosis not present

## 2016-12-09 DIAGNOSIS — I482 Chronic atrial fibrillation: Secondary | ICD-10-CM | POA: Diagnosis not present

## 2016-12-09 DIAGNOSIS — J449 Chronic obstructive pulmonary disease, unspecified: Secondary | ICD-10-CM | POA: Diagnosis not present

## 2016-12-09 DIAGNOSIS — I509 Heart failure, unspecified: Secondary | ICD-10-CM | POA: Diagnosis not present

## 2016-12-09 DIAGNOSIS — E1122 Type 2 diabetes mellitus with diabetic chronic kidney disease: Secondary | ICD-10-CM | POA: Diagnosis not present

## 2016-12-09 DIAGNOSIS — N183 Chronic kidney disease, stage 3 (moderate): Secondary | ICD-10-CM | POA: Diagnosis not present

## 2016-12-09 DIAGNOSIS — E785 Hyperlipidemia, unspecified: Secondary | ICD-10-CM | POA: Diagnosis not present

## 2016-12-09 DIAGNOSIS — Z430 Encounter for attention to tracheostomy: Secondary | ICD-10-CM | POA: Diagnosis not present

## 2016-12-09 DIAGNOSIS — I130 Hypertensive heart and chronic kidney disease with heart failure and stage 1 through stage 4 chronic kidney disease, or unspecified chronic kidney disease: Secondary | ICD-10-CM | POA: Diagnosis not present

## 2016-12-09 DIAGNOSIS — D72829 Elevated white blood cell count, unspecified: Secondary | ICD-10-CM | POA: Diagnosis not present

## 2016-12-09 DIAGNOSIS — Z48813 Encounter for surgical aftercare following surgery on the respiratory system: Secondary | ICD-10-CM | POA: Diagnosis not present

## 2016-12-15 DIAGNOSIS — N183 Chronic kidney disease, stage 3 (moderate): Secondary | ICD-10-CM | POA: Diagnosis not present

## 2016-12-15 DIAGNOSIS — E785 Hyperlipidemia, unspecified: Secondary | ICD-10-CM | POA: Diagnosis not present

## 2016-12-15 DIAGNOSIS — I130 Hypertensive heart and chronic kidney disease with heart failure and stage 1 through stage 4 chronic kidney disease, or unspecified chronic kidney disease: Secondary | ICD-10-CM | POA: Diagnosis not present

## 2016-12-15 DIAGNOSIS — J449 Chronic obstructive pulmonary disease, unspecified: Secondary | ICD-10-CM | POA: Diagnosis not present

## 2016-12-15 DIAGNOSIS — Z7951 Long term (current) use of inhaled steroids: Secondary | ICD-10-CM | POA: Diagnosis not present

## 2016-12-15 DIAGNOSIS — D72829 Elevated white blood cell count, unspecified: Secondary | ICD-10-CM | POA: Diagnosis not present

## 2016-12-15 DIAGNOSIS — I509 Heart failure, unspecified: Secondary | ICD-10-CM | POA: Diagnosis not present

## 2016-12-15 DIAGNOSIS — I482 Chronic atrial fibrillation: Secondary | ICD-10-CM | POA: Diagnosis not present

## 2016-12-15 DIAGNOSIS — E1122 Type 2 diabetes mellitus with diabetic chronic kidney disease: Secondary | ICD-10-CM | POA: Diagnosis not present

## 2016-12-15 DIAGNOSIS — Z430 Encounter for attention to tracheostomy: Secondary | ICD-10-CM | POA: Diagnosis not present

## 2016-12-15 DIAGNOSIS — Z48813 Encounter for surgical aftercare following surgery on the respiratory system: Secondary | ICD-10-CM | POA: Diagnosis not present

## 2016-12-17 ENCOUNTER — Other Ambulatory Visit: Payer: Self-pay | Admitting: Family Medicine

## 2016-12-17 ENCOUNTER — Ambulatory Visit (INDEPENDENT_AMBULATORY_CARE_PROVIDER_SITE_OTHER): Payer: Medicare Other | Admitting: Internal Medicine

## 2016-12-17 ENCOUNTER — Encounter: Payer: Self-pay | Admitting: Internal Medicine

## 2016-12-17 VITALS — BP 138/70 | HR 70 | Temp 97.8°F | Ht 65.0 in | Wt 158.8 lb

## 2016-12-17 DIAGNOSIS — I5033 Acute on chronic diastolic (congestive) heart failure: Secondary | ICD-10-CM | POA: Diagnosis not present

## 2016-12-17 MED ORDER — FUROSEMIDE 20 MG PO TABS: ORAL_TABLET | ORAL | 0 refills | Status: DC

## 2016-12-17 NOTE — Patient Instructions (Signed)
Schedule a follow up appointment for Friday, Jan 5. It is important that you be seen again.   Take Furosemide (Lasix) 40 mg daily until told otherwise. Do not restart your Potassium pill yet.   We will check your electrolytes today and if you Potassium is low we will let you know to start that medication.

## 2016-12-17 NOTE — Assessment & Plan Note (Addendum)
Symptoms consistent with acute CHF exacerbation. Patient is up 10 lbs since discontinuation of Lasix and has evidence of volume overload on exam. Doubt PNA as patient has equal breath sounds on exam, non-productive cough, no hypoxia, and is afebrile. Well's score 0 so low suspicion for PE.  -start Lasix 40 mg daily  -hold potassium as values have been labile in the recent past, will check BMET today  -f/u in 2 days  -return precautions for earlier evaluation discussed

## 2016-12-17 NOTE — Addendum Note (Signed)
Addended by: [place] Pink on: 12/17/2016 09:40 AM   Modules accepted: Orders

## 2016-12-17 NOTE — Progress Notes (Signed)
Subjective:    [name] - 71 y.o. female MRN [id number]  Date of birth: [birth year]  HPI  [name] is here for SDA for lower extremity edema.  Lower Extremity Edema: Has been present for about a week and worsening now to the point that she had to force her feet into her shoes this morning. Is endorsing a non-productive cough present for a couple weeks. She has had DOE and PND. She admits to orthopnea. Denies significant SOB at rest and chest pain. She has been afebrile at home. She has not been taking her Lasix or Potassium since 12/5. She was instructed to follow up shortly after discontinuation of this medication and failed to do so. She presents with Lasix bottle with Sig that reads "take one tablet daily. Take two tablets on Monday, Wednesday and Friday". At her pacemaker check on 12/18 she was told to follow up with PCP about possibly resuming these medications.    -  reports that she quit smoking about 5 years ago. Her smoking use included Cigarettes. She has a 5.00 pack-year smoking history. She has never used smokeless tobacco. - Review of Systems: Per HPI. - Past Medical History: Patient Active Problem List   Diagnosis Date Noted  . Symptomatic anemia 11/12/2016  . Diabetes mellitus, type 2 ([place]) 11/05/2016  . 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 Laryngeal stridor   . Respiratory distress   . Acute kidney injury superimposed on chronic kidney disease ([place])   . Permanent atrial fibrillation ([place])   . Laryngeal stenosis 09/18/2016  . Elevated troponin   . SOB (shortness of breath) 08/17/2016  . Acute encephalopathy   . Esophageal abnormality   . Endotracheal tube present   . Somnolence   . Respiratory failure requiring intubation ([place]) 07/11/2016  . Persistent atrial fibrillation ([place]) 07/10/2016  . Acute respiratory failure with hypoxia ([place])   . Hypotension due to drugs   . Demand ischemia ([place])   . Chest discomfort 02/05/2016  . COPD exacerbation  ([place])   . Palpitations   . Chest pain 11/25/2015  . Dyspnea on exertion   . Polymyalgia rheumatica ([place]) 04/10/2015  . Low back pain 03/28/2015  . Hypertrophic cardiomyopathy ([place]) 02/15/2015  . Eczema 09/18/2014  . Environmental allergies 09/18/2014  . Pre-diabetes 11/26/2013  . COPD, moderate ([place]) 11/01/2013  . Chronic renal failure, stage 3 (moderate) 08/28/2012  . Preventative health care 08/18/2012  . Complete heart block ([place]) 04/03/2011  . Anemia 02/17/2011  . AVM (arteriovenous malformation) 02/13/2011  . PACEMAKER-St.Jude 11/28/2010  . CHRONIC KIDNEY DISEASE STAGE III (MODERATE) 09/24/2010  . Acute on chronic congestive heart failure ([place]) 08/30/2010  . ARTHRITIS 07/24/2010  . Generalized anxiety disorder 07/23/2010  . Atrial fibrillation ([place]) 07/16/2010  . Hyperlipidemia 02/11/2007  . Essential hypertension 02/11/2007  . APNEA, SLEEP 02/11/2007   - Medications: reviewed and updated   Objective:   Physical Exam BP 138/70   Pulse 70   Temp 97.8 F (36.6 C) (Oral)   Ht 5\' 5"  (1.651 m)   Wt 158 lb 12.8 oz (72 kg)   BMI 26.43 kg/m , O2 97% at rest, O2 94-96% with walking  Gen: NAD, alert, cooperative with exam HEENT: trach in place without excessive secretions, MMM  CV: RRR, good S1/S2, no murmur, 2+ edema to knees bilaterally  Resp: no retractions, no increased WOB at rest, crackles in bases bilaterally, equal breath sounds bilaterally     Assessment & Plan:   Acute on chronic congestive heart failure (  [place]) Symptoms consistent with acute CHF exacerbation. Patient is up 10 lbs since discontinuation of Lasix and has evidence of volume overload on exam. Doubt PNA as patient has equal breath sounds on exam, non-productive cough, no hypoxia, and is afebrile. Well's score 0 so low suspicion for PE.  -start Lasix 40 mg daily  -hold potassium as values have been labile in the recent past, will check BMET today  -f/u in 2 days  -return precautions for earlier  evaluation discussed     [name], D.O. 12/17/2016, 9:32 AM PGY-2, [place]

## 2016-12-18 ENCOUNTER — Other Ambulatory Visit: Payer: Self-pay

## 2016-12-18 NOTE — Patient Outreach (Signed)
      Unsuccessful attempt made to contact patient for assessment of community care coordination needs. Will make another attempt later this month to reach patient via telephone.

## 2016-12-19 ENCOUNTER — Ambulatory Visit: Payer: Medicare Other | Admitting: Internal Medicine

## 2016-12-21 DIAGNOSIS — I509 Heart failure, unspecified: Secondary | ICD-10-CM | POA: Diagnosis not present

## 2016-12-21 DIAGNOSIS — J449 Chronic obstructive pulmonary disease, unspecified: Secondary | ICD-10-CM | POA: Diagnosis not present

## 2016-12-22 ENCOUNTER — Telehealth: Payer: Self-pay | Admitting: Family Medicine

## 2016-12-22 DIAGNOSIS — Z430 Encounter for attention to tracheostomy: Secondary | ICD-10-CM | POA: Diagnosis not present

## 2016-12-22 DIAGNOSIS — J386 Stenosis of larynx: Secondary | ICD-10-CM | POA: Diagnosis not present

## 2016-12-22 DIAGNOSIS — Z87891 Personal history of nicotine dependence: Secondary | ICD-10-CM | POA: Diagnosis not present

## 2016-12-22 DIAGNOSIS — Z930 Tracheostomy status: Secondary | ICD-10-CM | POA: Diagnosis not present

## 2016-12-22 NOTE — Telephone Encounter (Signed)
Pt needs refill on alprazolam.  [facility] on [address]

## 2016-12-23 DIAGNOSIS — E785 Hyperlipidemia, unspecified: Secondary | ICD-10-CM | POA: Diagnosis not present

## 2016-12-23 DIAGNOSIS — Z430 Encounter for attention to tracheostomy: Secondary | ICD-10-CM | POA: Diagnosis not present

## 2016-12-23 DIAGNOSIS — E1122 Type 2 diabetes mellitus with diabetic chronic kidney disease: Secondary | ICD-10-CM | POA: Diagnosis not present

## 2016-12-23 DIAGNOSIS — Z7951 Long term (current) use of inhaled steroids: Secondary | ICD-10-CM | POA: Diagnosis not present

## 2016-12-23 DIAGNOSIS — I130 Hypertensive heart and chronic kidney disease with heart failure and stage 1 through stage 4 chronic kidney disease, or unspecified chronic kidney disease: Secondary | ICD-10-CM | POA: Diagnosis not present

## 2016-12-23 DIAGNOSIS — N183 Chronic kidney disease, stage 3 (moderate): Secondary | ICD-10-CM | POA: Diagnosis not present

## 2016-12-23 DIAGNOSIS — J449 Chronic obstructive pulmonary disease, unspecified: Secondary | ICD-10-CM | POA: Diagnosis not present

## 2016-12-23 DIAGNOSIS — Z48813 Encounter for surgical aftercare following surgery on the respiratory system: Secondary | ICD-10-CM | POA: Diagnosis not present

## 2016-12-23 DIAGNOSIS — I509 Heart failure, unspecified: Secondary | ICD-10-CM | POA: Diagnosis not present

## 2016-12-23 DIAGNOSIS — I482 Chronic atrial fibrillation: Secondary | ICD-10-CM | POA: Diagnosis not present

## 2016-12-23 DIAGNOSIS — D72829 Elevated white blood cell count, unspecified: Secondary | ICD-10-CM | POA: Diagnosis not present

## 2016-12-23 NOTE — Telephone Encounter (Signed)
Xanax rx refilled.

## 2016-12-25 ENCOUNTER — Other Ambulatory Visit: Payer: Self-pay

## 2016-12-26 ENCOUNTER — Ambulatory Visit (INDEPENDENT_AMBULATORY_CARE_PROVIDER_SITE_OTHER): Payer: Medicare Other | Admitting: Internal Medicine

## 2016-12-26 ENCOUNTER — Encounter: Payer: Self-pay | Admitting: Internal Medicine

## 2016-12-26 DIAGNOSIS — I509 Heart failure, unspecified: Secondary | ICD-10-CM

## 2016-12-26 MED ORDER — FUROSEMIDE 20 MG PO TABS: 40.0000 mg | ORAL_TABLET | Freq: Every day | ORAL | 0 refills | Status: DC

## 2016-12-26 MED ORDER — POTASSIUM CHLORIDE ER 10 MEQ PO TBCR: 20.0000 meq | EXTENDED_RELEASE_TABLET | Freq: Every day | ORAL | 0 refills | Status: DC

## 2016-12-26 NOTE — Patient Instructions (Addendum)
Continue with your Lasix as is (40 mg daily). Start Potassium 20 daily.   I want you to see your PCP, Dr. [name], in one month.   Take Care,   Dr. Juleen China

## 2016-12-26 NOTE — Assessment & Plan Note (Addendum)
Improved significantly from prior office visit. No significant signs of volume overload on exam and patient is down 11 lbs from 1/3 and appears to be back at her dry weight. Will continue Lasix 40 mg daily. BMET from 1/3 showed K of 4.8. Patient does not have significantly impaired kidney function so will start her on Kdur 20 meq daily given increase in lasix. Follow up with PCP in one month.

## 2016-12-26 NOTE — Progress Notes (Signed)
   Subjective:    [name] - 71 y.o. female MRN [id number]  Date of birth: 1946/09/10  HPI  [name] is here for follow up of CHF exacerbation.  Acute CHF: Patient has been compliant with Lasix 40 mg daily. Reports her LE edema has improved significantly. She has not had significant SOB or DOE. No longer coughing.   -  reports that she quit smoking about 5 years ago. Her smoking use included Cigarettes. She has a 5.00 pack-year smoking history. She has never used smokeless tobacco. - Review of Systems: Per HPI. - Past Medical History: Patient Active Problem List   Diagnosis Date Noted  . Symptomatic anemia 11/12/2016  . Diabetes mellitus, type 2 ([place]) 11/05/2016  . 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 Laryngeal stridor   . Respiratory distress   . Acute kidney injury superimposed on chronic kidney disease ([place])   . Permanent atrial fibrillation ([place])   . Laryngeal stenosis 09/18/2016  . Elevated troponin   . SOB (shortness of breath) 08/17/2016  . Acute encephalopathy   . Esophageal abnormality   . Endotracheal tube present   . Somnolence   . Respiratory failure requiring intubation ([place]) 07/11/2016  . Persistent atrial fibrillation ([place]) 07/10/2016  . Acute respiratory failure with hypoxia ([place])   . Hypotension due to drugs   . Demand ischemia ([place])   . Chest discomfort 02/05/2016  . COPD exacerbation ([place])   . Palpitations   . Chest pain 11/25/2015  . Dyspnea on exertion   . Polymyalgia rheumatica ([place]) 04/10/2015  . Low back pain 03/28/2015  . Hypertrophic cardiomyopathy ([place]) 02/15/2015  . Eczema 09/18/2014  . Environmental allergies 09/18/2014  . Pre-diabetes 11/26/2013  . COPD, moderate ([place]) 11/01/2013  . Chronic renal failure, stage 3 (moderate) 08/28/2012  . Preventative health care 08/18/2012  . Complete heart block ([place]) 04/03/2011  . Anemia 02/17/2011  . AVM (arteriovenous malformation) 02/13/2011  . PACEMAKER-St.Jude 11/28/2010  .  CHRONIC KIDNEY DISEASE STAGE III (MODERATE) 09/24/2010  . Acute on chronic congestive heart failure ([place]) 08/30/2010  . ARTHRITIS 07/24/2010  . Generalized anxiety disorder 07/23/2010  . Atrial fibrillation ([place]) 07/16/2010  . Hyperlipidemia 02/11/2007  . Essential hypertension 02/11/2007  . APNEA, SLEEP 02/11/2007   - Medications: reviewed and updated   Objective:   Physical Exam BP 122/74   Pulse 87   Temp 97.7 F (36.5 C) (Oral)   Ht 5\' 5"  (1.651 m)   Wt 147 lb (66.7 kg)   SpO2 98%   BMI 24.46 kg/m  Gen: NAD, alert, cooperative with exam, well-appearing CV: RRR, good S1/S2, no murmur, trace LE edema to mid shins  Resp: CTABL, no wheezes, non-labored  Assessment & Plan:   Acute on chronic congestive heart failure (HCC) Improved significantly from prior office visit. No significant signs of volume overload on exam and patient is down 11 lbs from 1/3 and appears to be back at her dry weight. Will continue Lasix 40 mg daily. BMET from 1/3 showed K of 4.8. Patient does not have significantly impaired kidney function so will start her on Kdur 20 meq daily given increase in lasix. Follow up with PCP in one month.     Phill Myron, D.O. 12/26/2016, 3:35 PM PGY-2, [place]

## 2016-12-26 NOTE — Patient Outreach (Signed)
[facility]) Care Management  12/26/2016   [name] [birth year] [id number]  Subjective: I am doing better, my doctor wants me to go to a hospital in [place]. I am going to talk to him more the next time I see him. I need help with bathing and dressing.     Objective:  Telephonic contact  Current Medications:  Current Outpatient Prescriptions  Medication Sig Dispense Refill  . acetaminophen (TYLENOL) 325 MG tablet Take 325 mg by mouth daily as needed (pain).    [name] albuterol (PROVENTIL) (2.5 MG/3ML) 0.083% nebulizer solution Take 3 mLs (2.5 mg total) by nebulization every 6 (six) hours as needed for wheezing or shortness of breath. 150 mL 1  . ALPRAZolam (XANAX) 0.5 MG tablet Take 1 tablet (0.5 mg total) by mouth at bedtime as needed for anxiety. 15 tablet 0  . aspirin EC 81 MG tablet Take 81 mg by mouth every morning.     . bisacodyl (DULCOLAX) 10 MG suppository Place 10 mg rectally daily as needed for mild constipation.     [name] CALCIUM PO Take 1 tablet by mouth once a week.     . cephALEXin (KEFLEX) 500 MG capsule Take 1 capsule (500 mg total) by mouth 2 (two) times daily. 14 capsule 0  . feeding supplement, ENSURE ENLIVE, (ENSURE ENLIVE) LIQD Take 237 mLs by mouth 2 (two) times daily between meals. 237 mL 12  . ferrous sulfate 325 (65 FE) MG tablet TAKE 1 TABLET BY MOUTH TWICE DAILY WITH MEALS TO KEEP BLOOD COUNT UP (Patient taking differently: Take 325 mg by mouth 2 (two) times daily with a meal. TO KEEP BLOOD COUNT UP) 60 tablet 11  . furosemide (LASIX) 20 MG tablet Take 2 tablets (40 mg total) by mouth daily. Take one tablet daily 90 tablet 0  . nitroGLYCERIN (NITROSTAT) 0.4 MG SL tablet Place 1 tablet (0.4 mg total) under the tongue every 5 (five) minutes as needed for chest pain. 25 tablet 1  . omeprazole (PRILOSEC) 20 MG capsule Take 1 capsule (20 mg total) by mouth daily. (Patient taking differently: Take 20 mg by mouth daily as needed (heartburn).  ) 30 capsule 6  . polyethylene glycol (MIRALAX / GLYCOLAX) packet Take 17 g by mouth daily. (Patient taking differently: Take 17 g by mouth daily as needed for mild constipation. ) 14 each 0  . potassium chloride (K-DUR) 10 MEQ tablet Take 2 tablets (20 mEq total) by mouth daily. 180 tablet 0  . rosuvastatin (CRESTOR) 20 MG tablet Take 1 tablet (20 mg total) by mouth daily. 90 tablet 3  . trolamine salicylate (ASPERCREME) 10 % cream Apply 1 application topically 2 (two) times daily as needed for muscle pain.      No current facility-administered medications for this visit.     Functional Status:  In your present state of health, do you have any difficulty performing the following activities: 12/25/2016 11/13/2016  Hearing? N N  Vision? Y Y  Difficulty concentrating or making decisions? Tempie Donning  Walking or climbing stairs? Y Y  Dressing or bathing? N Y  Doing errands, shopping? Tempie Donning  Preparing Food and eating ? Y -  Using the Toilet? N -  In the past six months, have you accidently leaked urine? N -  Do you have problems with loss of bowel control? N -  Managing your Medications? N -  Managing your Finances? N -  Housekeeping or managing your Housekeeping? Y -  Some recent data  might be hidden    Fall/Depression Screening: PHQ 2/9 Scores 12/26/2016 12/25/2016 12/17/2016 11/18/2016 09/29/2016 09/22/2016 08/27/2016  PHQ - 2 Score 0 0 0 0 0 0 0   THN CM Care Plan Problem One   Flowsheet Row Most Recent Value  Care Plan Problem One  patient had several acute care admissions in the last 6 months  Role Documenting the Problem One  Care Management [place] for Problem One  Active  THN Long Term Goal (31-90 days)  Patient will have no acute care admission for the next 31 days  THN Long Term Goal Start Date  12/24/16  Interventions for Problem One Long Term Goal  assessmen tof community care coordination needs.   THN CM Short Term Goal #1 (0-30 days)  In the next 28 days, patient will  have been assessed for [place] Medicaid PCS Program   [facility] CM Short Term Goal #1 Start Date  12/24/16  Interventions for Short Term Goal #1  During this home visit, patient states she needs assistance with ADLs, IADLs     Assessment:  Successful in making telephonic contact after several unsuccessful attempt. Patient reports feeling better, however, she states she needs assistance with ADLs, IADLs. Patient reports having full medicaid, making her eligible for [facility] PCS Program.  RNCM reviewed progress used by the [facility] [place] will assist patient by facilitating her application for the [facility] PCS Program.  Plan:  Assist with completion of the [place] Medicaid PCS Program. Telephone contact later this month.

## 2016-12-30 DIAGNOSIS — J449 Chronic obstructive pulmonary disease, unspecified: Secondary | ICD-10-CM | POA: Diagnosis not present

## 2016-12-30 DIAGNOSIS — Z7951 Long term (current) use of inhaled steroids: Secondary | ICD-10-CM | POA: Diagnosis not present

## 2016-12-30 DIAGNOSIS — E1122 Type 2 diabetes mellitus with diabetic chronic kidney disease: Secondary | ICD-10-CM | POA: Diagnosis not present

## 2016-12-30 DIAGNOSIS — I509 Heart failure, unspecified: Secondary | ICD-10-CM | POA: Diagnosis not present

## 2016-12-30 DIAGNOSIS — N183 Chronic kidney disease, stage 3 (moderate): Secondary | ICD-10-CM | POA: Diagnosis not present

## 2016-12-30 DIAGNOSIS — Z48813 Encounter for surgical aftercare following surgery on the respiratory system: Secondary | ICD-10-CM | POA: Diagnosis not present

## 2016-12-30 DIAGNOSIS — I130 Hypertensive heart and chronic kidney disease with heart failure and stage 1 through stage 4 chronic kidney disease, or unspecified chronic kidney disease: Secondary | ICD-10-CM | POA: Diagnosis not present

## 2016-12-30 DIAGNOSIS — Z430 Encounter for attention to tracheostomy: Secondary | ICD-10-CM | POA: Diagnosis not present

## 2016-12-30 DIAGNOSIS — D72829 Elevated white blood cell count, unspecified: Secondary | ICD-10-CM | POA: Diagnosis not present

## 2016-12-30 DIAGNOSIS — I482 Chronic atrial fibrillation: Secondary | ICD-10-CM | POA: Diagnosis not present

## 2016-12-30 DIAGNOSIS — E785 Hyperlipidemia, unspecified: Secondary | ICD-10-CM | POA: Diagnosis not present

## 2017-01-01 ENCOUNTER — Other Ambulatory Visit: Payer: Self-pay

## 2017-01-01 NOTE — Patient Outreach (Signed)
[facility]) Care Management  [place]  01/01/2017   Shambria Camerer [birth year] [id number]  Subjective:   Objective:   Encounter Medications:  Outpatient Encounter Prescriptions as of 01/01/2017  Medication Sig  . acetaminophen (TYLENOL) 325 MG tablet Take 325 mg by mouth daily as needed (pain).  [name] albuterol (PROVENTIL) (2.5 MG/3ML) 0.083% nebulizer solution Take 3 mLs (2.5 mg total) by nebulization every 6 (six) hours as needed for wheezing or shortness of breath.  . ALPRAZolam (XANAX) 0.5 MG tablet Take 1 tablet (0.5 mg total) by mouth at bedtime as needed for anxiety.  [name] aspirin EC 81 MG tablet Take 81 mg by mouth every morning.   . bisacodyl (DULCOLAX) 10 MG suppository Place 10 mg rectally daily as needed for mild constipation.   [name] CALCIUM PO Take 1 tablet by mouth once a week.   . cephALEXin (KEFLEX) 500 MG capsule Take 1 capsule (500 mg total) by mouth 2 (two) times daily.  . feeding supplement, ENSURE ENLIVE, (ENSURE ENLIVE) LIQD Take 237 mLs by mouth 2 (two) times daily between meals.  . ferrous sulfate 325 (65 FE) MG tablet TAKE 1 TABLET BY MOUTH TWICE DAILY WITH MEALS TO KEEP BLOOD COUNT UP (Patient taking differently: Take 325 mg by mouth 2 (two) times daily with a meal. TO KEEP BLOOD COUNT UP)  . furosemide (LASIX) 20 MG tablet Take 2 tablets (40 mg total) by mouth daily. Take one tablet daily  . nitroGLYCERIN (NITROSTAT) 0.4 MG SL tablet Place 1 tablet (0.4 mg total) under the tongue every 5 (five) minutes as needed for chest pain.  [name] omeprazole (PRILOSEC) 20 MG capsule Take 1 capsule (20 mg total) by mouth daily. (Patient taking differently: Take 20 mg by mouth daily as needed (heartburn). )  . polyethylene glycol (MIRALAX / GLYCOLAX) packet Take 17 g by mouth daily. (Patient taking differently: Take 17 g by mouth daily as needed for mild constipation. )  . potassium chloride (K-DUR) 10 MEQ tablet Take 2 tablets (20 mEq total) by mouth daily.   . rosuvastatin (CRESTOR) 20 MG tablet Take 1 tablet (20 mg total) by mouth daily.  [name] trolamine salicylate (ASPERCREME) 10 % cream Apply 1 application topically 2 (two) times daily as needed for muscle pain.    No facility-administered encounter medications on file as of 01/01/2017.     Functional Status:  In your present state of health, do you have any difficulty performing the following activities: 12/25/2016 11/13/2016  Hearing? N N  Vision? Y Y  Difficulty concentrating or making decisions? Tempie Donning  Walking or climbing stairs? Y Y  Dressing or bathing? N Y  Doing errands, shopping? Tempie Donning  Preparing Food and eating ? Y -  Using the Toilet? N -  In the past six months, have you accidently leaked urine? N -  Do you have problems with loss of bowel control? N -  Managing your Medications? N -  Managing your Finances? N -  Housekeeping or managing your Housekeeping? Y -  Some recent data might be hidden    Fall/Depression Screening: PHQ 2/9 Scores 12/26/2016 12/25/2016 12/17/2016 11/18/2016 09/29/2016 09/22/2016 08/27/2016  PHQ - 2 Score 0 0 0 0 0 0 0   THN CM Care Plan Problem One   Flowsheet Row Most Recent Value  Care Plan Problem One  patient had several acute care admissions in the last 6 months  Role Documenting the Problem One  Care Management [place] for  Problem One  Active  THN Long Term Goal (31-90 days)  Patient will have no acute care admission for the next 31 days  THN Long Term Goal Start Date  12/24/16  Interventions for Problem One Long Term Goal  assessmen tof community care coordination needs.   THN CM Short Term Goal #1 (0-30 days)  In the next 28 days, patient will have been assessed for [place] Medicaid PCS Program   [facility] CM Short Term Goal #1 Start Date  12/24/16  Interventions for Short Term Goal #1  telephone contact with patient to assess her need for community care coordination.     Assessment:   Plan:

## 2017-01-02 ENCOUNTER — Encounter: Payer: Self-pay | Admitting: Licensed Clinical Social Worker

## 2017-01-02 NOTE — Progress Notes (Signed)
LCSW contacted by Launa Grill RN care manager for coordination of care. Patient needs personal care services in her home to help with ADL's.  Per Pam patient now has Medicaid, which is the first step of eligibility for PCS. LCSW completed section A of the PCS application.  Provided PCP with an update on the request.  Dr. [name] agrees and completed [facility] request.  PCS application faxed to [facility]. [facility] care manager notified.    Plan: LCSW will follow up on application in two business days.  Casimer Lanius, LCSW Licensed Clinical Social Worker [place]   [phone] 4:19 PM

## 2017-01-04 ENCOUNTER — Emergency Department (HOSPITAL_COMMUNITY)
Admission: EM | Admit: 2017-01-04 | Discharge: 2017-01-04 | Disposition: A | Discharge status: Home / Self Care | Payer: Medicare Other | Attending: Emergency Medicine | Admitting: Emergency Medicine

## 2017-01-04 ENCOUNTER — Other Ambulatory Visit: Payer: Self-pay

## 2017-01-04 ENCOUNTER — Emergency Department (HOSPITAL_COMMUNITY): Payer: Medicare Other

## 2017-01-04 ENCOUNTER — Encounter (HOSPITAL_COMMUNITY): Payer: Self-pay | Admitting: Emergency Medicine

## 2017-01-04 DIAGNOSIS — I130 Hypertensive heart and chronic kidney disease with heart failure and stage 1 through stage 4 chronic kidney disease, or unspecified chronic kidney disease: Secondary | ICD-10-CM | POA: Diagnosis not present

## 2017-01-04 DIAGNOSIS — J189 Pneumonia, unspecified organism: Secondary | ICD-10-CM | POA: Diagnosis not present

## 2017-01-04 DIAGNOSIS — R05 Cough: Secondary | ICD-10-CM | POA: Diagnosis not present

## 2017-01-04 DIAGNOSIS — J181 Lobar pneumonia, unspecified organism: Secondary | ICD-10-CM | POA: Insufficient documentation

## 2017-01-04 DIAGNOSIS — Z87891 Personal history of nicotine dependence: Secondary | ICD-10-CM | POA: Insufficient documentation

## 2017-01-04 DIAGNOSIS — I509 Heart failure, unspecified: Secondary | ICD-10-CM | POA: Diagnosis not present

## 2017-01-04 DIAGNOSIS — Z930 Tracheostomy status: Secondary | ICD-10-CM | POA: Diagnosis not present

## 2017-01-04 DIAGNOSIS — E1122 Type 2 diabetes mellitus with diabetic chronic kidney disease: Secondary | ICD-10-CM | POA: Diagnosis not present

## 2017-01-04 DIAGNOSIS — Z79899 Other long term (current) drug therapy: Secondary | ICD-10-CM | POA: Insufficient documentation

## 2017-01-04 DIAGNOSIS — N183 Chronic kidney disease, stage 3 (moderate): Secondary | ICD-10-CM | POA: Insufficient documentation

## 2017-01-04 DIAGNOSIS — R0602 Shortness of breath: Secondary | ICD-10-CM | POA: Diagnosis present

## 2017-01-04 DIAGNOSIS — Z7982 Long term (current) use of aspirin: Secondary | ICD-10-CM | POA: Diagnosis not present

## 2017-01-04 DIAGNOSIS — J449 Chronic obstructive pulmonary disease, unspecified: Secondary | ICD-10-CM | POA: Diagnosis not present

## 2017-01-04 LAB — CBC WITH DIFFERENTIAL/PLATELET
Basophils Absolute: 0 10*3/uL (ref 0.0–0.1)
Basophils Relative: 1 %
Eosinophils Absolute: 0.1 10*3/uL (ref 0.0–0.7)
Eosinophils Relative: 2 %
HEMATOCRIT: 27.4 % — AB (ref 36.0–46.0)
HEMOGLOBIN: 8.2 g/dL — AB (ref 12.0–15.0)
LYMPHS ABS: 1.9 10*3/uL (ref 0.7–4.0)
Lymphocytes Relative: 41 %
MCH: 26.6 pg (ref 26.0–34.0)
MCHC: 29.9 g/dL — AB (ref 30.0–36.0)
MCV: 89 fL (ref 78.0–100.0)
MONO ABS: 0.4 10*3/uL (ref 0.1–1.0)
Monocytes Relative: 8 %
NEUTROS ABS: 2.2 10*3/uL (ref 1.7–7.7)
NEUTROS PCT: 48 %
Platelets: 280 10*3/uL (ref 150–400)
RBC: 3.08 MIL/uL — ABNORMAL LOW (ref 3.87–5.11)
RDW: 18.3 % — AB (ref 11.5–15.5)
WBC: 4.6 10*3/uL (ref 4.0–10.5)

## 2017-01-04 LAB — BRAIN NATRIURETIC PEPTIDE: B NATRIURETIC PEPTIDE 5: 298 pg/mL — AB (ref 0.0–100.0)

## 2017-01-04 LAB — COMPREHENSIVE METABOLIC PANEL
ALK PHOS: 75 U/L (ref 38–126)
ALT: 9 U/L — ABNORMAL LOW (ref 14–54)
ANION GAP: 9 (ref 5–15)
AST: 23 U/L (ref 15–41)
Albumin: 3.1 g/dL — ABNORMAL LOW (ref 3.5–5.0)
BILIRUBIN TOTAL: 0.4 mg/dL (ref 0.3–1.2)
BUN: 14 mg/dL (ref 6–20)
CALCIUM: 9.2 mg/dL (ref 8.9–10.3)
CO2: 28 mmol/L (ref 22–32)
Chloride: 105 mmol/L (ref 101–111)
Creatinine, Ser: 1.28 mg/dL — ABNORMAL HIGH (ref 0.44–1.00)
GFR calc non Af Amer: 41 mL/min — ABNORMAL LOW (ref >60)
GFR, EST AFRICAN AMERICAN: 47 mL/min — AB (ref >60)
Glucose, Bld: 102 mg/dL — ABNORMAL HIGH (ref 65–99)
Potassium: 3.2 mmol/L — ABNORMAL LOW (ref 3.5–5.1)
Sodium: 142 mmol/L (ref 135–145)
TOTAL PROTEIN: 6.4 g/dL — AB (ref 6.5–8.1)

## 2017-01-04 MED ORDER — ALBUTEROL SULFATE (2.5 MG/3ML) 0.083% IN NEBU
5.0000 mg | INHALATION_SOLUTION | Freq: Once | RESPIRATORY_TRACT | Status: AC
  Administered 2017-01-04: 5 mg via RESPIRATORY_TRACT

## 2017-01-04 MED ORDER — ALBUTEROL SULFATE (2.5 MG/3ML) 0.083% IN NEBU
INHALATION_SOLUTION | RESPIRATORY_TRACT | Status: AC
  Filled 2017-01-04: qty 6

## 2017-01-04 MED ORDER — AZITHROMYCIN 250 MG PO TABS
500.0000 mg | ORAL_TABLET | Freq: Once | ORAL | Status: AC
  Administered 2017-01-04: 500 mg via ORAL
  Filled 2017-01-04: qty 2

## 2017-01-04 MED ORDER — AZITHROMYCIN 250 MG PO TABS: 250.0000 mg | ORAL_TABLET | Freq: Every day | ORAL | 0 refills | Status: DC

## 2017-01-04 NOTE — Progress Notes (Signed)
This encounter was created in error - please disregard.

## 2017-01-04 NOTE — ED Notes (Signed)
Unable to get temp at this time due to pt getting a breathing treatment

## 2017-01-04 NOTE — ED Triage Notes (Signed)
Pt. reports SOB / orthopnea with productive cough and chest congestion onset this morning , denies fever or chills , she has a tracheostomy .

## 2017-01-04 NOTE — ED Provider Notes (Signed)
[facility] DEPT Provider Note   CSN: [id number] Arrival date & time: 01/04/17  0346     History   Chief Complaint Chief Complaint  Patient presents with  . Shortness of Breath    Cough    HPI [name] is a 71 y.o. female past medical history of asthma, trach, presenting today with respiratory symptoms. Patient states she's had worsening shortness of breath and congestion for the past 2 weeks. This suddenly got worse yesterday. She denies any fevers or chills. She states she's had some mild wheezing. She was evaluated and her son did some deep suctioning and he got purulent return. She is concerned for infection. She is otherwise been in her normal state of health. There are no further complaints.  10 Systems reviewed and are negative for acute change except as noted in the HPI.    HPI  Past Medical History:  Diagnosis Date  . Anemia   . Angina   . Angioedema    2/2 ACE  . Arteriovenous malformation of stomach   . Arthritis   . Asthma   . AVM (arteriovenous malformation) of colon    small intestine; stomach  . Blood transfusion   . CHF (congestive heart failure) ([place])   . Complete heart block ([place])    s/p PPM 1998  . Complication of anesthesia    Difficult airway; anaphylaxis and swelling with propofol  . DDD (degenerative disc disease)   . Depression   . Diastolic heart failure   . Fatty liver 07/26/10  . GERD (gastroesophageal reflux disease)   . GI bleed   . Headache   . History of alcohol abuse Stopped Fall 2012  . History of tobacco use Quit Fall 2012  . Hx of cardiovascular stress test    a. Lexiscan Myoview (10/15):  Small inferolateral and apical defect c/w scar and poss soft tissue attenuation, no ischemia, EF 42%  . Hx of colonic polyp 08/13/10   adenomatous  . Hx of colonoscopy   . Hyperlipidemia   . Hyperlipidemia   . Hypertension   . Hypertrophic cardiomyopathy ([place])    dx by Dr [name] 2009  . Iron deficiency anemia   . Panic  attack   . Panic attacks   . Permanent atrial fibrillation ([place])   . Pneumonia   . Renal failure    baseline creatinine 1.6  . Right arm pain 01/08/2012  . RLS (restless legs syndrome)    Dx 06/2007  . Shortness of breath    sob on exertation  . Sleep apnea     Patient Active Problem List   Diagnosis Date Noted  . Symptomatic anemia 11/12/2016  . Diabetes mellitus, type 2 ([place]) 11/05/2016  . 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 Laryngeal stridor   . Respiratory distress   . Acute kidney injury superimposed on chronic kidney disease ([place])   . Permanent atrial fibrillation ([place])   . Laryngeal stenosis 09/18/2016  . Elevated troponin   . SOB (shortness of breath) 08/17/2016  . Acute encephalopathy   . Esophageal abnormality   . Endotracheal tube present   . Somnolence   . Respiratory failure requiring intubation ([place]) 07/11/2016  . Persistent atrial fibrillation ([place]) 07/10/2016  . Acute respiratory failure with hypoxia ([place])   . Hypotension due to drugs   . Demand ischemia ([place])   . Chest discomfort 02/05/2016  . COPD exacerbation ([place])   . Palpitations   . Chest pain 11/25/2015  . Dyspnea on exertion   .  Polymyalgia rheumatica ([place]) 04/10/2015  . Low back pain 03/28/2015  . Hypertrophic cardiomyopathy ([place]) 02/15/2015  . Eczema 09/18/2014  . Environmental allergies 09/18/2014  . Pre-diabetes 11/26/2013  . COPD, moderate ([place]) 11/01/2013  . Chronic renal failure, stage 3 (moderate) 08/28/2012  . Preventative health care 08/18/2012  . Complete heart block ([place]) 04/03/2011  . Anemia 02/17/2011  . AVM (arteriovenous malformation) 02/13/2011  . PACEMAKER-St.Jude 11/28/2010  . CHRONIC KIDNEY DISEASE STAGE III (MODERATE) 09/24/2010  . Acute on chronic congestive heart failure ([place]) 08/30/2010  . ARTHRITIS 07/24/2010  . Generalized anxiety disorder 07/23/2010  . Atrial fibrillation ([place]) 07/16/2010  . Hyperlipidemia 02/11/2007  . Essential hypertension 02/11/2007  .  APNEA, SLEEP 02/11/2007    Past Surgical History:  Procedure Laterality Date  . CARDIAC CATHETERIZATION    . CARDIAC CATHETERIZATION N/A 08/19/2016   Procedure: Right/Left Heart Cath and Coronary Angiography;  Surgeon: Jolaine Artist, MD;  Location: [facility];  Service: Cardiovascular;  Laterality: N/A;  . DIRECT LARYNGOSCOPY N/A 09/18/2016   Procedure: DIRECT LARYNGOSCOPY, BRONCHOSCOPY, REMOVAL OF INTUBATION GRANULOMA;  Surgeon: [name], MD;  Location: [facility] OR;  Service: ENT;  Laterality: N/A;  . DIRECT LARYNGOSCOPY N/A 10/20/2016   Procedure: EXTUBATION AND FLEXIBLE LARYNGOSCOPE;  Surgeon: [name], MD;  Location: [facility];  Service: ENT;  Laterality: N/A;  . DIRECT LARYNGOSCOPY N/A 10/29/2016   Procedure: DIRECT LARYNGOSCOPY;  Surgeon: [name], MD;  Location: [facility] OR;  Service: ENT;  Laterality: N/A;  . ESOPHAGOGASTRODUODENOSCOPY  12/23/2011   Procedure: ESOPHAGOGASTRODUODENOSCOPY (EGD);  Surgeon: [name], MD;  Location: [facility];  Service: Endoscopy;  Laterality: N/A;  . EXTUBATION (ENDOTRACHEAL) IN OR N/A 07/21/2016   Procedure: EXTUBATION (ENDOTRACHEAL) IN OR;  Surgeon: [name], MD;  Location: [facility];  Service: ENT;  Laterality: N/A;  . GIVENS CAPSULE STUDY  12/23/2011   Procedure: GIVENS CAPSULE STUDY;  Surgeon: [name], MD;  Location: [facility];  Service: Endoscopy;  Laterality: N/A;  . MICROLARYNGOSCOPY WITH LASER N/A 10/16/2016   Procedure: MICRODIRECTLARYNGOSCOPY WITH LASER ABLATION AND KENLOG INJECTION;  Surgeon: [name], MD;  Location: [facility];  Service: ENT;  Laterality: N/A;  . PACEMAKER INSERTION  1998   st jude, most recent gen change by [name] 4/12  . TRACHEOSTOMY TUBE PLACEMENT N/A 10/29/2016   Procedure: TRACHEOSTOMY;  Surgeon: [name], MD;  Location: [facility];  Service: ENT;  Laterality: N/A;  . TUBAL LIGATION  04/01/2000    OB History    No data available       Home Medications    Prior to Admission medications     Medication Sig Start Date End Date Taking? Authorizing Provider  acetaminophen (TYLENOL) 325 MG tablet Take 325 mg by mouth daily as needed (pain).    Historical Provider, MD  albuterol (PROVENTIL) (2.5 MG/3ML) 0.083% nebulizer solution Take 3 mLs (2.5 mg total) by nebulization every 6 (six) hours as needed for wheezing or shortness of breath. 08/11/16   Dinah C Ngetich, NP  ALPRAZolam (XANAX) 0.5 MG tablet Take 1 tablet (0.5 mg total) by mouth at bedtime as needed for anxiety. 07/26/16   Domenic Polite, MD  aspirin EC 81 MG tablet Take 81 mg by mouth every morning.     Historical Provider, MD  azithromycin (ZITHROMAX) 250 MG tablet Take 1 tablet (250 mg total) by mouth daily. 01/04/17   Everlene Balls, MD  bisacodyl (DULCOLAX) 10 MG suppository Place 10 mg rectally daily as needed for mild constipation.  Historical Provider, MD  CALCIUM PO Take 1 tablet by mouth once a week.     Historical Provider, MD  cephALEXin (KEFLEX) 500 MG capsule Take 1 capsule (500 mg total) by mouth 2 (two) times daily. 11/28/16   Alphonzo Grieve, MD  feeding supplement, ENSURE ENLIVE, (ENSURE ENLIVE) LIQD Take 237 mLs by mouth 2 (two) times daily between meals. 11/15/16   Sela Hilding, MD  ferrous sulfate 325 (65 FE) MG tablet TAKE 1 TABLET BY MOUTH TWICE DAILY WITH MEALS TO KEEP BLOOD COUNT UP Patient taking differently: Take 325 mg by mouth 2 (two) times daily with a meal. TO KEEP BLOOD COUNT UP 08/26/16   Steve Rattler, DO  furosemide (LASIX) 20 MG tablet Take 2 tablets (40 mg total) by mouth daily. Take one tablet daily 12/26/16   Nicolette Bang, DO  nitroGLYCERIN (NITROSTAT) 0.4 MG SL tablet Place 1 tablet (0.4 mg total) under the tongue every 5 (five) minutes as needed for chest pain. 01/18/15   Eileen Stanford, PA-C  omeprazole (PRILOSEC) 20 MG capsule Take 1 capsule (20 mg total) by mouth daily. Patient taking differently: Take 20 mg by mouth daily as needed (heartburn).  08/26/16   Steve Rattler, DO   polyethylene glycol (MIRALAX / GLYCOLAX) packet Take 17 g by mouth daily. Patient taking differently: Take 17 g by mouth daily as needed for mild constipation.  07/26/16   Domenic Polite, MD  potassium chloride (K-DUR) 10 MEQ tablet Take 2 tablets (20 mEq total) by mouth daily. 12/26/16   Nicolette Bang, DO  rosuvastatin (CRESTOR) 20 MG tablet Take 1 tablet (20 mg total) by mouth daily. 08/26/16   Steve Rattler, DO  trolamine salicylate (ASPERCREME) 10 % cream Apply 1 application topically 2 (two) times daily as needed for muscle pain.     Historical Provider, MD    Family History Family History  Problem Relation Age of Onset  . Hypertension Mother   . Stroke Mother   . Heart disease Mother   . Aneurysm Mother   . CVA Mother   . Hypertension Father   . Stroke Father   . Heart attack Father   . Alcohol abuse Father   . Hypertension Sister   . Hypertension Brother   . Colon cancer Brother   . Diabetes Sister   . Diabetes Brother   . Cancer Brother   . Heart attack Brother   . Heart disease Sister   . Alcohol abuse Brother     Social History Social History  Substance Use Topics  . Smoking status: Former Smoker    Packs/day: 0.10    Years: 50.00    Types: Cigarettes    Quit date: 08/16/2011  . Smokeless tobacco: Never Used     Comment: quit 2012  . Alcohol use 0.0 oz/week     Comment: quit 08/16/2011     Allergies   Ace inhibitors; Other; and Propofol   Review of Systems Review of Systems   Physical Exam Updated Vital Signs BP 145/87 (BP Location: Right Arm)   Pulse 70   Temp 98.1 F (36.7 C) (Oral)   Resp 20   SpO2 100%   Physical Exam  Constitutional: She is oriented to person, place, and time. She appears well-developed and well-nourished. No distress.  HENT:  Head: Normocephalic and atraumatic.  Nose: Nose normal.  Mouth/Throat: Oropharynx is clear and moist. No oropharyngeal exudate.  Lurline Idol was speaking valve is in place. No surrounding  cellulitis.  No drainage.  Eyes: Conjunctivae and EOM are normal. Pupils are equal, round, and reactive to light. No scleral icterus.  Neck: Normal range of motion. Neck supple. No JVD present. No tracheal deviation present. No thyromegaly present.  Cardiovascular: Normal rate, regular rhythm and normal heart sounds.  Exam reveals no gallop and no friction rub.   No murmur heard. Pulmonary/Chest: Effort normal and breath sounds normal. No respiratory distress. She has no wheezes. She exhibits no tenderness.  Abdominal: Soft. Bowel sounds are normal. She exhibits no distension and no mass. There is no tenderness. There is no rebound and no guarding.  Musculoskeletal: Normal range of motion. She exhibits no edema or tenderness.  Lymphadenopathy:    She has no cervical adenopathy.  Neurological: She is alert and oriented to person, place, and time. No cranial nerve deficit. She exhibits normal muscle tone.  Skin: Skin is warm and dry. No rash noted. No erythema. No pallor.  Nursing note and vitals reviewed.    ED Treatments / Results  Labs (all labs ordered are listed, but only abnormal results are displayed) Labs Reviewed  CBC WITH DIFFERENTIAL/PLATELET - Abnormal; Notable for the following:       Result Value   RBC 3.08 (*)    Hemoglobin 8.2 (*)    HCT 27.4 (*)    MCHC 29.9 (*)    RDW 18.3 (*)    All other components within normal limits  COMPREHENSIVE METABOLIC PANEL - Abnormal; Notable for the following:    Potassium 3.2 (*)    Glucose, Bld 102 (*)    Creatinine, Ser 1.28 (*)    Total Protein 6.4 (*)    Albumin 3.1 (*)    ALT 9 (*)    GFR calc non Af Amer 41 (*)    GFR calc Af Amer 47 (*)    All other components within normal limits  BRAIN NATRIURETIC PEPTIDE - Abnormal; Notable for the following:    B Natriuretic Peptide 298.0 (*)    All other components within normal limits    EKG  EKG Interpretation  Date/Time:  Sunday January 04 2017 04:07:21 EST Ventricular Rate:    70 PR Interval:    QRS Duration: 222 QT Interval:  534 QTC Calculation: 576 R Axis:   -28 Text Interpretation:  Ventricular-paced rhythm no scarbossa No significant change since last tracing Confirmed by Glynn Octave [phone]) on 01/04/2017 5:08:00 AM       Radiology Dg Chest 2 View  Result Date: 01/04/2017 CLINICAL DATA:  Productive cough and chest congestion. Shortness of breath. EXAM: CHEST  2 VIEW COMPARISON:  11/28/2016 FINDINGS: Tracheostomy tube at the thoracic inlet. Dual lead right-sided pacemaker in place. Stable cardiomegaly. Increased interstitial opacities from prior exam. Small right pleural effusion. No confluent airspace disease. No pneumothorax. Stable osseous structures. IMPRESSION: Increased interstitial opacities from prior exam, may be interstitial edema or acute interstitial pneumonia superimposed on chronic change. Small right pleural effusion. Electronically Signed   By: Jeb Levering M.D.   On: 01/04/2017 05:06    Procedures Procedures (including critical care time)  Medications Ordered in ED Medications  azithromycin (ZITHROMAX) tablet 500 mg (not administered)  albuterol (PROVENTIL) (2.5 MG/3ML) 0.083% nebulizer solution 5 mg (5 mg Nebulization Given 01/04/17 0406)     Initial Impression / Assessment and Plan / ED Course  I have reviewed the triage vital signs and the nursing notes.  Pertinent labs & imaging results that were available during my care of the patient were  reviewed by me and considered in my medical decision making (see chart for details).     Patient presents to the emergency department for respiratory symptoms. She was given breathing treatment in triage and states she feels much better.  I obtained a chest x-ray which reveals a right lower lobe pneumonia. Will treat with 5 days of azithromycin. First dose given in the emergency department. She demonstrates good understanding of the plan, primary care follow-up advised in 3 days.  Strict return precautions given. Vital signs were within her normal limits and she is safe for discharge.  Final Clinical Impressions(s) / ED Diagnoses   Final diagnoses:  Community acquired pneumonia of right lower lobe of lung (HCC)    New Prescriptions New Prescriptions   AZITHROMYCIN (ZITHROMAX) 250 MG TABLET    Take 1 tablet (250 mg total) by mouth daily.     Everlene Balls, MD 01/04/17 [phone]

## 2017-01-04 NOTE — ED Notes (Signed)
Patient Alert and oriented X4. Stable and ambulatory. Patient verbalized understanding of the discharge instructions.  Patient belongings were taken by the patient.  

## 2017-01-06 ENCOUNTER — Encounter: Payer: Self-pay | Admitting: Family Medicine

## 2017-01-09 ENCOUNTER — Other Ambulatory Visit: Payer: Self-pay

## 2017-01-09 ENCOUNTER — Encounter: Payer: Self-pay | Admitting: Internal Medicine

## 2017-01-09 ENCOUNTER — Ambulatory Visit (INDEPENDENT_AMBULATORY_CARE_PROVIDER_SITE_OTHER): Payer: Medicare Other | Admitting: Internal Medicine

## 2017-01-09 ENCOUNTER — Encounter: Payer: Self-pay | Admitting: Licensed Clinical Social Worker

## 2017-01-09 DIAGNOSIS — I5023 Acute on chronic systolic (congestive) heart failure: Secondary | ICD-10-CM

## 2017-01-09 NOTE — Progress Notes (Signed)
   [facility] Phone: [phone]  Subjective:  [name] is a 71 year old female presenting to clinic for follow-up of pneumonia. She was seen in the ED on 1/21 with shortness of breath. CXR showed increased interstitial opacities, may be interstitial edema or acute interstitial pneumonia, also with small right pleural effusion. BNP was 298, which is below her baseline of ~400. She was discharged home with a 5 day course of Azithromycin.   Since being discharged from the ED, Pt states that she feels "okay". She has had some mild dry cough. No sputum. No fevers or chills. She states her legs have been swelling. She endorses orthopnea and says she has to sleep propped up on one of her big couch pillows. She has been taking Lasix 20mg  in the AM and 20mg  in the PM and she has been urinating a lot.  ROS: See HPI for pertinent positives and negatives  Past Medical History- HTN, a-fib, hypertrophic cardiomyopathy w/ St. Jude's pacemaker, HFpEF, COPD, sleep apnea, laryngeal stenosis s/p trach, T2DM  Family history reviewed for today's visit. No changes.  Social history- patient is a former smoker. Quit in 2012.  Objective: BP 122/64   Pulse 68   Temp 97.7 F (36.5 C) (Oral)   Wt 148 lb (67.1 kg)   SpO2 99%   BMI 24.63 kg/m  Gen: NAD, alert, cooperative with exam HEENT: NCAT, EOMI, MMM Neck: FROM, supple, trach in place, site is clean without signs of infection, mild JVD. CV: Irregularly irregular rhythm, normal rate, no murmur Resp: Normal work of breathing, mild crackles auscultated in the lung bases bilaterally, otherwise lungs are clear with normal air movement, no wheezes Msk: 1+ symmetric edema halfway up to the knee  Assessment/Plan: Acute on Chronic Congestive Heart Failure: Pt was recently treated for pneumonia with a 5 day course of Azithromycin. She denies any fevers, chills, productive cough, or purulent tracheal secretions. She is now endorsing dry cough,  orthopnea, and increased lower extremity edema. Weight is 148, up 6lbs from 1/19, although previous weights have been 147-158 in the last month. Per chart review, her dry weight was believed to be around 147. However, she does appear to be mildly fluid overloaded on exam today, with mild JVD, mild bibasilar crackles, and 1+ symmetric pitting edema. She is currently taking Lasix 20mg  in the morning and 20mg  in the evening, along with K-dur 24mEq in the morning and 25mEq in the evening.  - Will increase Lasix to 40mg  bid x 3 days - Will also increase K-dur to 25mEq bid x 3 days - Follow-up in clinic in 3 days for reassessment - Will check a BMP at follow-up visit.   Hyman Bible, MD PGY-2

## 2017-01-09 NOTE — Patient Instructions (Addendum)
It was so nice to see you!  Please take the Furosemide (Lasix) 2 tablets (40mg ) twice a day and Potassium 2 tablets (20mg ) twice a day for 3 days. Then go back to Lasix 2 tablets (40mg ) once a day in the morning and Potassium 2 tablets (20mg ) once a day in the morning.  We will see you back at the beginning of next week to check your fluid levels and recheck some labs.  -Dr. Brett Albino

## 2017-01-09 NOTE — Progress Notes (Addendum)
LCSW called [facility] [phone] to verify receipt and process of patient's PCS application.   Per Tammy at [facility] patient was denied do to her Medicaid.  She has [place] Medicaid which does not qualify for [place]. They will notify patient.   LCSW informed Pam, [facility] Care Manager, she will also communicate this to patient.  Casimer Lanius, LCSW Licensed Clinical Social Worker [facility]   [phone] 3:27 PM

## 2017-01-09 NOTE — Patient Outreach (Signed)
    Telephone call from Veatrice Bourbon, LCSW Family Practice. [name] reports having received information from the [facility] PCS In Home Service Program advising that patient was not meet the criteria for the program due to not having full medicaid. This RNCM made call to patent who stated she had been given the information as well, states she called her [facility], was told her eligibility would be reviewed in April   Plan: Home visit is planned with this patient is scheduled within the next 30 days for case closure, assessment of further need for community care coordination

## 2017-01-11 NOTE — Assessment & Plan Note (Signed)
Pt was recently treated for pneumonia with a 5 day course of Azithromycin. She denies any fevers, chills, productive cough, or purulent tracheal secretions. She is now endorsing dry cough, orthopnea, and increased lower extremity edema. Weight is 148, up 6lbs from 1/19, although previous weights have been 147-158 in the last month. Per chart review, her dry weight was believed to be around 147. However, she does appear to be mildly fluid overloaded on exam today, with mild JVD, mild bibasilar crackles, and 1+ symmetric pitting edema. She is currently taking Lasix 20mg  in the morning and 20mg  in the evening, along with K-dur 86mEq in the morning and 38mEq in the evening.  - Will increase Lasix to 40mg  bid x 3 days - Will also increase K-dur to 71mEq bid x 3 days - Follow-up in clinic in 3 days for reassessment - Will check a BMP at follow-up visit.

## 2017-01-12 ENCOUNTER — Ambulatory Visit: Payer: Medicare Other | Admitting: Internal Medicine

## 2017-01-12 DIAGNOSIS — I4891 Unspecified atrial fibrillation: Secondary | ICD-10-CM | POA: Diagnosis not present

## 2017-01-12 DIAGNOSIS — Z930 Tracheostomy status: Secondary | ICD-10-CM | POA: Diagnosis not present

## 2017-01-12 DIAGNOSIS — I509 Heart failure, unspecified: Secondary | ICD-10-CM | POA: Diagnosis not present

## 2017-01-12 DIAGNOSIS — J398 Other specified diseases of upper respiratory tract: Secondary | ICD-10-CM | POA: Diagnosis not present

## 2017-01-12 DIAGNOSIS — D649 Anemia, unspecified: Secondary | ICD-10-CM | POA: Diagnosis not present

## 2017-01-13 ENCOUNTER — Ambulatory Visit: Payer: Medicare Other | Admitting: Internal Medicine

## 2017-01-14 ENCOUNTER — Ambulatory Visit (INDEPENDENT_AMBULATORY_CARE_PROVIDER_SITE_OTHER): Payer: Medicare Other | Admitting: Obstetrics and Gynecology

## 2017-01-14 ENCOUNTER — Other Ambulatory Visit: Payer: Self-pay | Admitting: Family Medicine

## 2017-01-14 VITALS — BP 102/60 | HR 75 | Temp 98.0°F | Wt 149.0 lb

## 2017-01-14 DIAGNOSIS — R600 Localized edema: Secondary | ICD-10-CM | POA: Diagnosis not present

## 2017-01-14 DIAGNOSIS — N183 Chronic kidney disease, stage 3 unspecified: Secondary | ICD-10-CM

## 2017-01-14 DIAGNOSIS — R0600 Dyspnea, unspecified: Secondary | ICD-10-CM

## 2017-01-14 MED ORDER — SCOPOLAMINE 1 MG/3DAYS TD PT72: 1.0000 | MEDICATED_PATCH | TRANSDERMAL | 1 refills | Status: DC

## 2017-01-14 MED ORDER — DM-GUAIFENESIN ER 30-600 MG PO TB12: 1.0000 | ORAL_TABLET | Freq: Two times a day (BID) | ORAL | 2 refills | Status: DC | PRN

## 2017-01-14 NOTE — Patient Instructions (Addendum)
Continue taking Lasix 40mg  daily (two pills) Take two potassium pills daily (total of 31meq)  Will get blood work today to check you kidney and potassium levels No need for CXR Follow-up if symtpoms worsen or not improved

## 2017-01-14 NOTE — Progress Notes (Signed)
     Subjective: Chief Complaint  Patient presents with  . Edema     HPI: [name] is a 71 y.o. presenting to clinic today to discuss the following:  #Dyspnea  Completed recent course of Abx for pneumonia Was also seen in clinic for CHF and treated Have issues with increased mucous especially at night Some associated wheezing No fevers   #Edema Swelling comes and goes Took the extra Lasix but didn't go to the bathroom anymore than usual  Wears compresion stockings Sleeps with 2-3 pillows at night; states she does it because of trach and not due to orthopnea  #AoCKD Had worsening of renal function on last labs with hypokalemia Needs recheck  ROS noted in HPI.  Past Medical, Surgical, Social, and Family History Reviewed & Updated per EMR. History  Smoking Status  . Former Smoker  . Packs/day: 0.10  . Years: 50.00  . Types: Cigarettes  . Quit date: 08/16/2011  Smokeless Tobacco  . Never Used    Comment: quit 2012    Objective: BP 102/60   Pulse 75   Temp 98 F (36.7 C) (Oral)   Wt 149 lb (67.6 kg)   SpO2 97%   BMI 24.79 kg/m  Vitals and nursing notes reviewed  Physical Exam Gen: NAD, alert, cooperative with exam HEENT: NCAT, EOMI, MMM Neck: FROM, supple, trach in place, site is clean without signs of infection CV:  normal rate Resp: Normal work of breathing, lungs are clear with normal air movement, no wheezes Msk: 1+ symmetric edema of feet  Assessment/Plan: 1. Dyspnea, unspecified type No signs of increased work of breathing on exam. Recent trach placement for acute respiratory failure. Completed all treatments for pneumonia and HF. No signs of either of these causing dyspnea. Will give Rx for mucinex to help with secretions. Follow-up with PCP.  2. CHRONIC KIDNEY DISEASE STAGE III (MODERATE) Will recheck BMP since she had an AKI on last check.  - Basic Metabolic Panel; Future  3. Localized edema Continues to have LE edema. Continue  Lasix at 40mg  BID and compression stockings.   PATIENT EDUCATION PROVIDED: See AVS    Orders Placed This Encounter  Procedures  . Basic Metabolic Panel    Standing Status:   Future    Standing Expiration Date:   01/14/2018    Luiz Blare, DO 01/14/2017, 2:29 PM PGY-3, [place]

## 2017-01-21 DIAGNOSIS — I509 Heart failure, unspecified: Secondary | ICD-10-CM | POA: Diagnosis not present

## 2017-01-21 DIAGNOSIS — J449 Chronic obstructive pulmonary disease, unspecified: Secondary | ICD-10-CM | POA: Diagnosis not present

## 2017-01-22 ENCOUNTER — Other Ambulatory Visit: Payer: Self-pay

## 2017-01-22 NOTE — Patient Outreach (Signed)
**Note [name]-Identified via Obfuscation** [facility]) Care Management   01/22/2017  Bev Drennen Jan 29, 1946 [id number]  [name] is an 71 y.o. female  Subjective:  I am doing a lot better.  Objective:   ROS  Elderly lady with natural hair style and tracheostomy   Physical Exam  ROS  Encounter Medications:   Outpatient Encounter Prescriptions as of 01/22/2017  Medication Sig  . acetaminophen (TYLENOL) 325 MG tablet Take 325 mg by mouth daily as needed (pain).  [name] albuterol (PROVENTIL) (2.5 MG/3ML) 0.083% nebulizer solution Take 3 mLs (2.5 mg total) by nebulization every 6 (six) hours as needed for wheezing or shortness of breath.  . ALPRAZolam (XANAX) 0.5 MG tablet Take 1 tablet (0.5 mg total) by mouth at bedtime as needed for anxiety.  [name] aspirin EC 81 MG tablet Take 81 mg by mouth every morning.   [name] azithromycin (ZITHROMAX) 250 MG tablet Take 1 tablet (250 mg total) by mouth daily.  . bisacodyl (DULCOLAX) 10 MG suppository Place 10 mg rectally daily as needed for mild constipation.   [name] CALCIUM PO Take 1 tablet by mouth once a week.   . cephALEXin (KEFLEX) 500 MG capsule Take 1 capsule (500 mg total) by mouth 2 (two) times daily.  [name] dextromethorphan-guaiFENesin (MUCINEX DM) 30-600 MG 12hr tablet Take 1 tablet by mouth 2 (two) times daily as needed for cough.  . feeding supplement, ENSURE ENLIVE, (ENSURE ENLIVE) LIQD Take 237 mLs by mouth 2 (two) times daily between meals.  . ferrous sulfate 325 (65 FE) MG tablet TAKE 1 TABLET BY MOUTH TWICE DAILY WITH MEALS TO KEEP BLOOD COUNT UP (Patient taking differently: Take 325 mg by mouth 2 (two) times daily with a meal. TO KEEP BLOOD COUNT UP)  . furosemide (LASIX) 20 MG tablet Take 2 tablets (40 mg total) by mouth daily. Take one tablet daily  . nitroGLYCERIN (NITROSTAT) 0.4 MG SL tablet Place 1 tablet (0.4 mg total) under the tongue every 5 (five) minutes as needed for chest pain.  [name] omeprazole (PRILOSEC) 20 MG capsule Take 1 capsule (20 mg  total) by mouth daily. (Patient taking differently: Take 20 mg by mouth daily as needed (heartburn). )  . polyethylene glycol (MIRALAX / GLYCOLAX) packet Take 17 g by mouth daily. (Patient taking differently: Take 17 g by mouth daily as needed for mild constipation. )  . potassium chloride (K-DUR) 10 MEQ tablet Take 2 tablets (20 mEq total) by mouth daily.  . rosuvastatin (CRESTOR) 20 MG tablet Take 1 tablet (20 mg total) by mouth daily.  [name] trolamine salicylate (ASPERCREME) 10 % cream Apply 1 application topically 2 (two) times daily as needed for muscle pain.    No facility-administered encounter medications on file as of 01/22/2017.     Functional Status:   In your present state of health, do you have any difficulty performing the following activities: 12/25/2016 11/13/2016  Hearing? N N  Vision? Y Y  Difficulty concentrating or making decisions? Tempie Donning  Walking or climbing stairs? Y Y  Dressing or bathing? N Y  Doing errands, shopping? Tempie Donning  Preparing Food and eating ? Y -  Using the Toilet? N -  In the past six months, have you accidently leaked urine? N -  Do you have problems with loss of bowel control? N -  Managing your Medications? N -  Managing your Finances? N -  Housekeeping or managing your Housekeeping? Y -  Some recent data might be hidden    Fall/Depression Screening:  PHQ 2/9 Scores 01/14/2017 01/09/2017 12/26/2016 12/25/2016 12/17/2016 11/18/2016 09/29/2016  PHQ - 2 Score 0 0 0 0 0 0 0   [facility] CM Care Plan Problem One   Flowsheet Row Most Recent Value  Care Plan Problem One  patient had several acute care admissions in the last 6 months  Role Documenting the Problem One  Care Management [place] for Problem One  Active  THN Long Term Goal (31-90 days)  Patient will have no acute care admission for the next 31 days  [facility]N Long Term Goal Start Date  12/24/16  [facility] Long Term Goal Met Date  01/22/17  Interventions for Problem One Long Term Goal  met wtih patent in  home for community care coordination  [facility] CM Short Term Goal #1 (0-30 days)  In the next 28 days, patient will have been assessed for [place] Medicaid PCS Program   [facility] CM Short Term Goal #1 Start Date  12/24/16  [facility] CM Short Term Goal #1 Met Date  01/22/17  Interventions for Short Term Goal #1  home visit to assess patient's progression.        Assessment:   Discharge home visit. Patient has demonstrated proficient knowledge in caring for her tracheostomy. Patient has good support, sister was present during this home visit.  Sister states she visits with patient often and they talk over the phone daily. Patient denies need for any additional case management needs at this time. Patient is awaiting decision on medicaid, if approved, patient will contact this RNCM for assistance wit completion of the [facility] PCS Program. In the meantime, patient's states her family will continue to take turns assisting with her IADLs.    Plan:   Discharge from caseload as patient has met her case management goals

## 2017-01-23 ENCOUNTER — Ambulatory Visit: Payer: Medicare Other | Admitting: Internal Medicine

## 2017-01-26 ENCOUNTER — Encounter: Payer: Self-pay | Admitting: Family Medicine

## 2017-01-26 ENCOUNTER — Ambulatory Visit (INDEPENDENT_AMBULATORY_CARE_PROVIDER_SITE_OTHER): Payer: Medicare Other | Admitting: Family Medicine

## 2017-01-26 ENCOUNTER — Other Ambulatory Visit: Payer: Self-pay | Admitting: Family Medicine

## 2017-01-26 VITALS — BP 118/68 | HR 70 | Temp 98.3°F | Ht 65.0 in | Wt 141.0 lb

## 2017-01-26 DIAGNOSIS — R634 Abnormal weight loss: Secondary | ICD-10-CM | POA: Diagnosis not present

## 2017-01-26 DIAGNOSIS — I5023 Acute on chronic systolic (congestive) heart failure: Secondary | ICD-10-CM

## 2017-01-26 MED ORDER — FUROSEMIDE 40 MG PO TABS
40.0000 mg | ORAL_TABLET | Freq: Every day | ORAL | Status: DC
Start: 2017-01-26 — End: 2017-01-27

## 2017-01-26 MED ORDER — ALPRAZOLAM 0.5 MG PO TABS: 0.5000 mg | ORAL_TABLET | Freq: Every evening | ORAL | 2 refills | Status: DC | PRN

## 2017-01-26 NOTE — Progress Notes (Signed)
   Subjective:    Patient ID: [name], female    DOB: Apr 28, 1946, 71 y.o.   MRN: [id number]   CC: medication refills, trach issues, weight loss  HPI: [name] is doing okay today, she has a lot of worries about her medications and trach, and is worried about if she is doing things properly. Needs refills on "nerve pills" (Xanax) which she takes as needed maybe 2-3 times a week, especially if she needs to go out in public. She also needs refill on Lasix and is confused about the dosage. She is unsure ifs he should be taking potassium still.  Her weight loss is concerning her greatly. She reports decreased appetite. She does not usually eat breakfast because she isn't hungry. Will usually then eat lunch around 4 pm that she describes as a solid meal. She will have a small snack before bed. She has lost about 40 pounds. She is concerned but chalks this up to being very sick and in/out of the hospital for the past 6 months, plus was recently treated for pneumonia. She reports hair loss, dry itchy skin, constipation (chronic and takes iron as well which does not help), nausea. She has noticed some BRB on the toilet paper after straining to have a bowel movement. She was told to drink 2 ensure a day during past hospitalization which she tries to do but these are difficult to afford. She no longer has a home health nurse or aide coming to the house so she is trying to manage her medications and trach on her own. Her son is staying with her but he is scared of taking care of the trach and helping with suctioning. She is left to do these things herself. She feels overwhelmed.    Smoking status reviewed- former smoker  Review of Systems- see HPI   Objective:  BP 118/68 (BP Location: Right Arm, Patient Position: Sitting, Cuff Size: Normal)   Pulse 70   Temp 98.3 F (36.8 C) (Oral)   Ht 5\' 5"  (1.651 m)   Wt 141 lb (64 kg)   SpO2 99%   BMI 23.46 kg/m  Vitals and nursing note  reviewed  General: elderly thin lady, trach in place, in NAD Cardiac: RRR, clear S1 and S2, no murmurs, rubs, or gallops Respiratory: clear to auscultation bilaterally, no increased work of breathing Abdomen: soft, nontender, nondistended Extremities: no edema or cyanosis. Skin: warm and dry, no rashes noted Neuro: alert and oriented, no focal deficits   Assessment & Plan:    Loss of weight  Unclear cause. Concern for underlying malignancy. Up to date on mammogram and colonoscopies. Reports BRB per rectum but likely due to hemorrhoids. Former smoker with small lung nodule on last chest CT in august 2017 with recommendation to repeat in 1 year.   -will check TSH today -recommended continuing ensure twice a day -follow up in 2 weeks  Acute on chronic congestive heart failure [facility])  Patient taking 40 mg Lasix daily along with potassium daily  -refilled lasix -continue potassium -follow up 2 weeks will check BMP at that time -[facility] nurse ordered to help patient with medications, trach care    Return in about 2 weeks (around 02/09/2017).   Lucila Maine, DO Family Medicine Resident PGY-1

## 2017-01-26 NOTE — Assessment & Plan Note (Signed)
  Patient taking 40 mg Lasix daily along with potassium daily  -refilled lasix -continue potassium -follow up 2 weeks will check BMP at that time -[facility] nurse ordered to help patient with medications, trach care

## 2017-01-26 NOTE — Patient Instructions (Addendum)
  It was great seeing you today!  Please take 40 mg lasix every day.  We will check your thyroid level today.   I have reordered a home health nurse for you to come to the house.  If you have trouble with the trach and your daughter would like to learn how to change the strap, you can make a nurse visit appointment.

## 2017-01-26 NOTE — Assessment & Plan Note (Signed)
  Unclear cause. Concern for underlying malignancy. Up to date on mammogram and colonoscopies. Reports BRB per rectum but likely due to hemorrhoids. Former smoker with small lung nodule on last chest CT in august 2017 with recommendation to repeat in 1 year.   -will check TSH today -recommended continuing ensure twice a day -follow up in 2 weeks

## 2017-01-27 ENCOUNTER — Other Ambulatory Visit: Payer: Self-pay | Admitting: *Deleted

## 2017-01-27 MED ORDER — FUROSEMIDE 40 MG PO TABS: 40.0000 mg | ORAL_TABLET | Freq: Every day | ORAL | 3 refills | Status: DC

## 2017-01-28 ENCOUNTER — Other Ambulatory Visit: Payer: Self-pay | Admitting: Family Medicine

## 2017-01-28 MED ORDER — FUROSEMIDE 40 MG PO TABS: 40.0000 mg | ORAL_TABLET | Freq: Every day | ORAL | 3 refills | Status: DC

## 2017-01-28 NOTE — Telephone Encounter (Signed)
Needs refill on her lasix. [facility] at [address]

## 2017-01-29 ENCOUNTER — Encounter: Payer: Self-pay | Admitting: Family Medicine

## 2017-01-29 DIAGNOSIS — Z430 Encounter for attention to tracheostomy: Secondary | ICD-10-CM | POA: Diagnosis not present

## 2017-01-29 DIAGNOSIS — I110 Hypertensive heart disease with heart failure: Secondary | ICD-10-CM | POA: Diagnosis not present

## 2017-01-29 DIAGNOSIS — Z87891 Personal history of nicotine dependence: Secondary | ICD-10-CM | POA: Diagnosis not present

## 2017-01-29 DIAGNOSIS — J449 Chronic obstructive pulmonary disease, unspecified: Secondary | ICD-10-CM | POA: Diagnosis not present

## 2017-01-29 DIAGNOSIS — I5023 Acute on chronic systolic (congestive) heart failure: Secondary | ICD-10-CM | POA: Diagnosis not present

## 2017-01-29 DIAGNOSIS — Z950 Presence of cardiac pacemaker: Secondary | ICD-10-CM | POA: Diagnosis not present

## 2017-02-02 DIAGNOSIS — I5023 Acute on chronic systolic (congestive) heart failure: Secondary | ICD-10-CM | POA: Diagnosis not present

## 2017-02-02 DIAGNOSIS — I110 Hypertensive heart disease with heart failure: Secondary | ICD-10-CM | POA: Diagnosis not present

## 2017-02-02 DIAGNOSIS — J449 Chronic obstructive pulmonary disease, unspecified: Secondary | ICD-10-CM | POA: Diagnosis not present

## 2017-02-02 DIAGNOSIS — Z430 Encounter for attention to tracheostomy: Secondary | ICD-10-CM | POA: Diagnosis not present

## 2017-02-02 DIAGNOSIS — Z87891 Personal history of nicotine dependence: Secondary | ICD-10-CM | POA: Diagnosis not present

## 2017-02-02 DIAGNOSIS — Z950 Presence of cardiac pacemaker: Secondary | ICD-10-CM | POA: Diagnosis not present

## 2017-02-04 DIAGNOSIS — Z930 Tracheostomy status: Secondary | ICD-10-CM | POA: Diagnosis not present

## 2017-02-04 DIAGNOSIS — I509 Heart failure, unspecified: Secondary | ICD-10-CM | POA: Diagnosis not present

## 2017-02-05 DIAGNOSIS — I110 Hypertensive heart disease with heart failure: Secondary | ICD-10-CM | POA: Diagnosis not present

## 2017-02-05 DIAGNOSIS — I5023 Acute on chronic systolic (congestive) heart failure: Secondary | ICD-10-CM | POA: Diagnosis not present

## 2017-02-05 DIAGNOSIS — J449 Chronic obstructive pulmonary disease, unspecified: Secondary | ICD-10-CM | POA: Diagnosis not present

## 2017-02-05 DIAGNOSIS — Z87891 Personal history of nicotine dependence: Secondary | ICD-10-CM | POA: Diagnosis not present

## 2017-02-05 DIAGNOSIS — Z430 Encounter for attention to tracheostomy: Secondary | ICD-10-CM | POA: Diagnosis not present

## 2017-02-05 DIAGNOSIS — Z950 Presence of cardiac pacemaker: Secondary | ICD-10-CM | POA: Diagnosis not present

## 2017-02-09 ENCOUNTER — Encounter: Payer: Self-pay | Admitting: Family Medicine

## 2017-02-09 ENCOUNTER — Ambulatory Visit (INDEPENDENT_AMBULATORY_CARE_PROVIDER_SITE_OTHER): Payer: Medicare Other | Admitting: Family Medicine

## 2017-02-09 VITALS — BP 124/60 | HR 73 | Temp 98.3°F | Ht 65.0 in | Wt 144.0 lb

## 2017-02-09 DIAGNOSIS — R05 Cough: Secondary | ICD-10-CM

## 2017-02-09 DIAGNOSIS — R634 Abnormal weight loss: Secondary | ICD-10-CM

## 2017-02-09 DIAGNOSIS — R059 Cough, unspecified: Secondary | ICD-10-CM

## 2017-02-09 MED ORDER — DEXTROMETHORPHAN HBR 15 MG/5ML PO SYRP: 10.0000 mL | ORAL_SOLUTION | Freq: Four times a day (QID) | ORAL | 0 refills | Status: DC | PRN

## 2017-02-09 NOTE — Progress Notes (Signed)
    Subjective:    Patient ID: [name], female    DOB: 12-Mar-1946, 71 y.o.   MRN: [id number]   CC: here for follow up on weight loss  Continues to lose weight. Appetite is poor especially in morning. Will eat first meal around 4pm then have a snack sized meal before bed. Will sometimes try to eat an egg and a piece of toast in the morning but still little appetite. Has been trying to get Ensure but difficulty affording it. She will occasionally eat soups but worried they have too much sodium in them and also notes cost is high. Often eats rice/noodles.   Worried about trach still. Has pain in her throat. Is coughing up secretions still. Does not get much out with suctioning. Stopped taking mucinex because saw little improvement with taking it. [employer] nurse has come out to see her but she feels didn't do much.  She is planning on getting mammogram in next few months, had one last year. Last colonoscopy was about 5 years ago.   Smoking status reviewed- former smoker  Review of Systems- denies fevers, chills, chest pain.  Endorses shortness of breath that has been ongoing since hospitalization. Has throat pain associated with trach site. Is coughing frequently. Has secretions that have been improving.    Objective:  BP 124/60   Pulse 73   Temp 98.3 F (36.8 C) (Oral)   Ht 5\' 5"  (1.651 m)   Wt 144 lb (65.3 kg)   SpO2 98%   BMI 23.96 kg/m  Vitals and nursing note reviewed  General: well nourished, in no acute distress, trach in place Cardiac: RRR, clear S1 and S2, no murmurs, rubs, or gallops Respiratory: clear to auscultation bilaterally, no increased work of breathing Abdomen: soft, nontender, nondistended, +BS Extremities: no edema or cyanosis. Neuro: alert and oriented, no focal deficits   Assessment & Plan:    Loss of weight  Patient concerned by this. Has not had a good appetite since hospitalization in the fall. Diet sounds poor and patient is likely not  getting enough protein. Occasionally uses ensure but this is limited by cost. TSH normal.  -recommended getting Carnation breakfast supplements to increase protein/nutrition in diet as this would be more affordable -patient to call Dr. Jenne Campus (RD) for appointment to discuss nutrition -patient to schedule mammogram  Cough  Patient has had chronic cough over past 2-3 months. Afebrile. Non-productive but trach in place and patient has secretions. Feels throat is irritated.  -dextromethorphan syrup given for cough -schedule tylenol 1 pill morning and evening for throat pain -follow up with ENT as scheduled next week    Return in about 4 weeks (around 03/09/2017).   Lucila Maine, DO Family Medicine Resident PGY-1

## 2017-02-09 NOTE — Patient Instructions (Addendum)
  It was good seeing you!  Let's try scheduling tylenol. Take 1 pill every morning and 1 pill every evening. This should help with your pain.  I'll give you a cough syrup to help with cough and throat irritation.  Please try to pick up [employer]- this is a good replacement for Ensure and should be more affordable.

## 2017-02-09 NOTE — Assessment & Plan Note (Addendum)
  [name] concerned by this. Has not had a good appetite since hospitalization in the fall. Diet sounds poor and [name] is likely not getting enough protein. Occasionally uses ensure but this is limited by cost. TSH normal.  -recommended getting Carnation breakfast supplements to increase protein/nutrition in diet as this would be more affordable -[name] to call Dr. Jenne Campus (RD) for appointment to discuss nutrition -[name] to schedule mammogram

## 2017-02-09 NOTE — Assessment & Plan Note (Signed)
  Patient has had chronic cough over past 2-3 months. Afebrile. Non-productive but trach in place and patient has secretions. Feels throat is irritated.  -dextromethorphan syrup given for cough -schedule tylenol 1 pill morning and evening for throat pain -follow up with ENT as scheduled next week

## 2017-02-10 DIAGNOSIS — J449 Chronic obstructive pulmonary disease, unspecified: Secondary | ICD-10-CM | POA: Diagnosis not present

## 2017-02-10 DIAGNOSIS — I5023 Acute on chronic systolic (congestive) heart failure: Secondary | ICD-10-CM | POA: Diagnosis not present

## 2017-02-10 DIAGNOSIS — Z950 Presence of cardiac pacemaker: Secondary | ICD-10-CM | POA: Diagnosis not present

## 2017-02-10 DIAGNOSIS — Z430 Encounter for attention to tracheostomy: Secondary | ICD-10-CM | POA: Diagnosis not present

## 2017-02-10 DIAGNOSIS — Z87891 Personal history of nicotine dependence: Secondary | ICD-10-CM | POA: Diagnosis not present

## 2017-02-10 DIAGNOSIS — I110 Hypertensive heart disease with heart failure: Secondary | ICD-10-CM | POA: Diagnosis not present

## 2017-02-12 LAB — BASIC METABOLIC PANEL
BUN / CREAT RATIO: 16 (ref 12–28)
BUN / CREAT RATIO: 17 (ref 12–28)
BUN: 14 mg/dL (ref 8–27)
BUN: 20 mg/dL (ref 8–27)
CHLORIDE: 102 mmol/L (ref 96–106)
CO2: 26 mmol/L (ref 18–29)
CO2: 31 mmol/L — ABNORMAL HIGH (ref 18–29)
CREATININE: 0.86 mg/dL (ref 0.57–1.00)
CREATININE: 1.17 mg/dL — AB (ref 0.57–1.00)
Calcium: 9.3 mg/dL (ref 8.7–10.3)
Calcium: 9.7 mg/dL (ref 8.7–10.3)
Chloride: 100 mmol/L (ref 96–106)
GFR calc Af Amer: 78 mL/min/{1.73_m2} (ref >59)
GFR calc Af Amer: 53 mL/min/{1.73_m2} — ABNORMAL LOW (ref >59)
GFR calc non Af Amer: 68 mL/min/{1.73_m2} (ref >59)
GFR, EST NON AFRICAN AMERICAN: 46 mL/min/{1.73_m2} — AB (ref >59)
GLUCOSE: 98 mg/dL (ref 65–99)
GLUCOSE: 99 mg/dL (ref 65–99)
POTASSIUM: 3.8 mmol/L (ref 3.5–5.2)
Potassium: 4.1 mmol/L (ref 3.5–5.2)
SODIUM: 142 mmol/L (ref 134–144)
Sodium: 140 mmol/L (ref 134–144)

## 2017-02-12 LAB — TSH: TSH: 1.94 u[IU]/mL (ref 0.450–4.500)

## 2017-02-13 ENCOUNTER — Telehealth: Payer: Self-pay | Admitting: Family Medicine

## 2017-02-13 NOTE — Telephone Encounter (Signed)
[place];  She tried to reach the pt today for a visit but pt didn't answer her phone. This will considered a no show

## 2017-02-13 NOTE — Telephone Encounter (Signed)
Will forward to MD to make aware. Deshay Kirstein,CMA  

## 2017-02-16 DIAGNOSIS — J449 Chronic obstructive pulmonary disease, unspecified: Secondary | ICD-10-CM | POA: Diagnosis not present

## 2017-02-16 DIAGNOSIS — Z950 Presence of cardiac pacemaker: Secondary | ICD-10-CM | POA: Diagnosis not present

## 2017-02-16 DIAGNOSIS — Z87891 Personal history of nicotine dependence: Secondary | ICD-10-CM | POA: Diagnosis not present

## 2017-02-16 DIAGNOSIS — I5023 Acute on chronic systolic (congestive) heart failure: Secondary | ICD-10-CM | POA: Diagnosis not present

## 2017-02-16 DIAGNOSIS — Z430 Encounter for attention to tracheostomy: Secondary | ICD-10-CM | POA: Diagnosis not present

## 2017-02-16 DIAGNOSIS — I110 Hypertensive heart disease with heart failure: Secondary | ICD-10-CM | POA: Diagnosis not present

## 2017-02-17 DIAGNOSIS — Z430 Encounter for attention to tracheostomy: Secondary | ICD-10-CM | POA: Diagnosis not present

## 2017-02-17 DIAGNOSIS — J386 Stenosis of larynx: Secondary | ICD-10-CM | POA: Diagnosis not present

## 2017-02-18 DIAGNOSIS — J449 Chronic obstructive pulmonary disease, unspecified: Secondary | ICD-10-CM | POA: Diagnosis not present

## 2017-02-18 DIAGNOSIS — I509 Heart failure, unspecified: Secondary | ICD-10-CM | POA: Diagnosis not present

## 2017-02-19 DIAGNOSIS — Z950 Presence of cardiac pacemaker: Secondary | ICD-10-CM | POA: Diagnosis not present

## 2017-02-19 DIAGNOSIS — Z87891 Personal history of nicotine dependence: Secondary | ICD-10-CM | POA: Diagnosis not present

## 2017-02-19 DIAGNOSIS — J449 Chronic obstructive pulmonary disease, unspecified: Secondary | ICD-10-CM | POA: Diagnosis not present

## 2017-02-19 DIAGNOSIS — I5023 Acute on chronic systolic (congestive) heart failure: Secondary | ICD-10-CM | POA: Diagnosis not present

## 2017-02-19 DIAGNOSIS — I110 Hypertensive heart disease with heart failure: Secondary | ICD-10-CM | POA: Diagnosis not present

## 2017-02-19 DIAGNOSIS — Z430 Encounter for attention to tracheostomy: Secondary | ICD-10-CM | POA: Diagnosis not present

## 2017-02-23 DIAGNOSIS — Z430 Encounter for attention to tracheostomy: Secondary | ICD-10-CM | POA: Diagnosis not present

## 2017-02-23 DIAGNOSIS — J386 Stenosis of larynx: Secondary | ICD-10-CM | POA: Diagnosis not present

## 2017-02-23 DIAGNOSIS — R0683 Snoring: Secondary | ICD-10-CM | POA: Diagnosis not present

## 2017-02-24 DIAGNOSIS — Z430 Encounter for attention to tracheostomy: Secondary | ICD-10-CM | POA: Diagnosis not present

## 2017-02-24 DIAGNOSIS — I110 Hypertensive heart disease with heart failure: Secondary | ICD-10-CM | POA: Diagnosis not present

## 2017-02-24 DIAGNOSIS — Z950 Presence of cardiac pacemaker: Secondary | ICD-10-CM | POA: Diagnosis not present

## 2017-02-24 DIAGNOSIS — I5023 Acute on chronic systolic (congestive) heart failure: Secondary | ICD-10-CM | POA: Diagnosis not present

## 2017-02-24 DIAGNOSIS — J449 Chronic obstructive pulmonary disease, unspecified: Secondary | ICD-10-CM | POA: Diagnosis not present

## 2017-02-24 DIAGNOSIS — Z87891 Personal history of nicotine dependence: Secondary | ICD-10-CM | POA: Diagnosis not present

## 2017-02-25 DIAGNOSIS — J449 Chronic obstructive pulmonary disease, unspecified: Secondary | ICD-10-CM | POA: Diagnosis not present

## 2017-02-25 DIAGNOSIS — I4891 Unspecified atrial fibrillation: Secondary | ICD-10-CM | POA: Diagnosis not present

## 2017-02-25 DIAGNOSIS — D649 Anemia, unspecified: Secondary | ICD-10-CM | POA: Diagnosis not present

## 2017-02-25 DIAGNOSIS — J398 Other specified diseases of upper respiratory tract: Secondary | ICD-10-CM | POA: Diagnosis not present

## 2017-02-25 DIAGNOSIS — Z930 Tracheostomy status: Secondary | ICD-10-CM | POA: Diagnosis not present

## 2017-02-26 DIAGNOSIS — Z950 Presence of cardiac pacemaker: Secondary | ICD-10-CM | POA: Diagnosis not present

## 2017-02-26 DIAGNOSIS — Z87891 Personal history of nicotine dependence: Secondary | ICD-10-CM | POA: Diagnosis not present

## 2017-02-26 DIAGNOSIS — J449 Chronic obstructive pulmonary disease, unspecified: Secondary | ICD-10-CM | POA: Diagnosis not present

## 2017-02-26 DIAGNOSIS — I110 Hypertensive heart disease with heart failure: Secondary | ICD-10-CM | POA: Diagnosis not present

## 2017-02-26 DIAGNOSIS — Z430 Encounter for attention to tracheostomy: Secondary | ICD-10-CM | POA: Diagnosis not present

## 2017-02-26 DIAGNOSIS — I5023 Acute on chronic systolic (congestive) heart failure: Secondary | ICD-10-CM | POA: Diagnosis not present

## 2017-03-04 DIAGNOSIS — Z430 Encounter for attention to tracheostomy: Secondary | ICD-10-CM | POA: Diagnosis not present

## 2017-03-04 DIAGNOSIS — J449 Chronic obstructive pulmonary disease, unspecified: Secondary | ICD-10-CM | POA: Diagnosis not present

## 2017-03-04 DIAGNOSIS — Z87891 Personal history of nicotine dependence: Secondary | ICD-10-CM | POA: Diagnosis not present

## 2017-03-04 DIAGNOSIS — I5023 Acute on chronic systolic (congestive) heart failure: Secondary | ICD-10-CM | POA: Diagnosis not present

## 2017-03-04 DIAGNOSIS — Z950 Presence of cardiac pacemaker: Secondary | ICD-10-CM | POA: Diagnosis not present

## 2017-03-04 DIAGNOSIS — I110 Hypertensive heart disease with heart failure: Secondary | ICD-10-CM | POA: Diagnosis not present

## 2017-03-04 DIAGNOSIS — Z930 Tracheostomy status: Secondary | ICD-10-CM | POA: Diagnosis not present

## 2017-03-06 ENCOUNTER — Telehealth: Payer: Self-pay | Admitting: Family Medicine

## 2017-03-06 NOTE — Telephone Encounter (Signed)
Services are complete, goals are met, and issues are resolved. Pt is discharged from home health services. ep

## 2017-03-09 NOTE — Telephone Encounter (Signed)
Ok, thanks for letting me know!

## 2017-03-09 NOTE — Telephone Encounter (Signed)
Will forward to MD to make sure this has been acknowledged before calling home health agency back. Zolton Dowson,CMA

## 2017-03-09 NOTE — Telephone Encounter (Signed)
[name] needs to be called back so she can know the dr had been notified.  Phone number   [phone]

## 2017-03-10 NOTE — Telephone Encounter (Signed)
Spoke with Rich Brave and let her know that provider is aware that patient has been discharged from their care.  Patient has an appointment next week to see PCP. Salem Lembke,CMA

## 2017-03-12 ENCOUNTER — Other Ambulatory Visit (HOSPITAL_BASED_OUTPATIENT_CLINIC_OR_DEPARTMENT_OTHER): Payer: Self-pay

## 2017-03-12 DIAGNOSIS — G4730 Sleep apnea, unspecified: Secondary | ICD-10-CM

## 2017-03-19 ENCOUNTER — Ambulatory Visit (INDEPENDENT_AMBULATORY_CARE_PROVIDER_SITE_OTHER): Payer: Medicare Other | Admitting: Family Medicine

## 2017-03-19 ENCOUNTER — Encounter: Payer: Self-pay | Admitting: Family Medicine

## 2017-03-19 VITALS — BP 102/66 | HR 70 | Temp 97.9°F | Ht 65.0 in | Wt 141.4 lb

## 2017-03-19 DIAGNOSIS — K5904 Chronic idiopathic constipation: Secondary | ICD-10-CM | POA: Insufficient documentation

## 2017-03-19 DIAGNOSIS — R634 Abnormal weight loss: Secondary | ICD-10-CM | POA: Diagnosis not present

## 2017-03-19 DIAGNOSIS — E118 Type 2 diabetes mellitus with unspecified complications: Secondary | ICD-10-CM | POA: Diagnosis not present

## 2017-03-19 LAB — POCT GLYCOSYLATED HEMOGLOBIN (HGB A1C): HEMOGLOBIN A1C: 5.8

## 2017-03-19 MED ORDER — SENNA 8.6 MG PO TABS: 1.0000 | ORAL_TABLET | Freq: Every day | ORAL | 2 refills | Status: DC

## 2017-03-19 NOTE — Assessment & Plan Note (Addendum)
  Has been using suppositories and colace without much success. Suspect poor diet is contributing.  -Recommended using miralax 1 to 2 capfuls daily with whatever drink she prefers -will give rx for senna to take every night

## 2017-03-19 NOTE — Progress Notes (Signed)
    Subjective:    Patient ID: Rudean Curt, female    DOB: [birth year], 71 y.o.   MRN: [id number]   CC: follow up weight loss, constipation,  Weight today 141 pounds, stable from last 2 visits. She is still worried she is losing weight. She endorses poor appetite, eating 2 small meals a day consisting mostly of carbs or sweets, cakes, cookies. She has been trying to drink a carnation breakfast every morning. She did not call to make an appointment with Dr. Jenne Campus because she forgot why she wanted me to see nutritionist. She is hesitant to start any medications for appetite stimulation at this time.   She has been constipated and reports her stools are very hard. She uses colace and a suppository as needed but stools are still very hard.   She is hoping to get trach removed in future and goes to get sleep study per Dr. Erik Obey end of the month. She is unsure when her next follow up with him is.  She is still worried overall about her general state of health. She reports a lot of anxiety since her prolonged hospitalization. She has frequent cough which worries her. No fevers or chills. No chest pain.   Smoking status reviewed- former smoker 55 pack years  Review of Systems- see HPI   Objective:  BP 102/66   Pulse 70   Temp 97.9 F (36.6 C) (Oral)   Ht 5\' 5"  (1.651 m)   Wt 141 lb 6.4 oz (64.1 kg)   SpO2 99%   BMI 23.53 kg/m  Vitals and nursing note reviewed  General: elderly lady, well nourished, in no acute distress HEENT: normocephalic, no scleral icterus or conjunctival pallor, no nasal discharge, moist mucous membranes Cardiac: RRR, clear S1 and S2, no murmurs, rubs, or gallops, +2 radial pulses bilaterally Respiratory: clear to auscultation bilaterally, no increased work of breathing Abdomen: soft, nontender, nondistended, no masses or organomegaly. Bowel sounds present Extremities: no edema or cyanosis Neuro: alert and oriented, no focal deficits  Assessment & Plan:    Diabetes mellitus, type 2 (HCC)  Last A1C 6.9 in November 2017, today down to 5.8  -check A1C every 3 months -patient to see RD -no medication at this time  Chronic idiopathic constipation  Has been using suppositories and colace without much success. Suspect poor diet is contributing.  -Recommended using miralax 1 to 2 capfuls daily with whatever drink she prefers -will give rx for senna to take every night   Loss of weight  Weight today stable at 141 pounds. Patient still reports inadequate caloric intake. Has been doing maybe 1 carnation supplement a day  -encouraged to drink 2 carnation supplements a day -encouraged to increase amount of protein in diet -patient to see RD to follow up on nutrition -will follow up in 3 months to assess progress, if still poor appetite/more weight loss can consider appetite stimulant    Return in about 3 months (around 06/18/2017).   Lucila Maine, DO Family Medicine Resident PGY-1

## 2017-03-19 NOTE — Patient Instructions (Signed)
   Please drink 2 carnation breakfasts a day to help with weight gain.  Take 1 capful of miralax today and if that does not soften the stool go up to 2 capfuls daily until bowel movements are soft.   Take senna every night to help your bowels move.

## 2017-03-19 NOTE — Assessment & Plan Note (Addendum)
  Last A1C 6.9 in November 2017, today down to 5.8  -check A1C every 3 months -patient to see RD -no medication at this time

## 2017-03-19 NOTE — Assessment & Plan Note (Signed)
  Weight today stable at 141 pounds. Patient still reports inadequate caloric intake. Has been doing maybe 1 carnation supplement a day  -encouraged to drink 2 carnation supplements a day -encouraged to increase amount of protein in diet -patient to see RD to follow up on nutrition -will follow up in 3 months to assess progress, if still poor appetite/more weight loss can consider appetite stimulant

## 2017-03-21 DIAGNOSIS — M6281 Muscle weakness (generalized): Secondary | ICD-10-CM | POA: Diagnosis not present

## 2017-03-21 DIAGNOSIS — J449 Chronic obstructive pulmonary disease, unspecified: Secondary | ICD-10-CM | POA: Diagnosis not present

## 2017-03-22 ENCOUNTER — Other Ambulatory Visit: Payer: Self-pay

## 2017-03-22 ENCOUNTER — Emergency Department (HOSPITAL_COMMUNITY)
Admission: EM | Admit: 2017-03-22 | Discharge: 2017-03-22 | Disposition: A | Discharge status: Home / Self Care | Payer: Medicare Other | Attending: Emergency Medicine | Admitting: Emergency Medicine

## 2017-03-22 ENCOUNTER — Emergency Department (HOSPITAL_COMMUNITY): Payer: Medicare Other

## 2017-03-22 ENCOUNTER — Encounter (HOSPITAL_COMMUNITY): Payer: Self-pay

## 2017-03-22 DIAGNOSIS — J40 Bronchitis, not specified as acute or chronic: Secondary | ICD-10-CM

## 2017-03-22 DIAGNOSIS — Z950 Presence of cardiac pacemaker: Secondary | ICD-10-CM | POA: Diagnosis not present

## 2017-03-22 DIAGNOSIS — R05 Cough: Secondary | ICD-10-CM | POA: Diagnosis not present

## 2017-03-22 DIAGNOSIS — J449 Chronic obstructive pulmonary disease, unspecified: Secondary | ICD-10-CM | POA: Diagnosis not present

## 2017-03-22 DIAGNOSIS — Z87891 Personal history of nicotine dependence: Secondary | ICD-10-CM | POA: Diagnosis not present

## 2017-03-22 DIAGNOSIS — Z7982 Long term (current) use of aspirin: Secondary | ICD-10-CM | POA: Insufficient documentation

## 2017-03-22 DIAGNOSIS — I110 Hypertensive heart disease with heart failure: Secondary | ICD-10-CM | POA: Insufficient documentation

## 2017-03-22 DIAGNOSIS — Z79899 Other long term (current) drug therapy: Secondary | ICD-10-CM | POA: Diagnosis not present

## 2017-03-22 DIAGNOSIS — I5030 Unspecified diastolic (congestive) heart failure: Secondary | ICD-10-CM | POA: Insufficient documentation

## 2017-03-22 DIAGNOSIS — R0602 Shortness of breath: Secondary | ICD-10-CM | POA: Diagnosis not present

## 2017-03-22 LAB — BASIC METABOLIC PANEL
ANION GAP: 9 (ref 5–15)
BUN: 42 mg/dL — ABNORMAL HIGH (ref 6–20)
CHLORIDE: 105 mmol/L (ref 101–111)
CO2: 27 mmol/L (ref 22–32)
Calcium: 9.9 mg/dL (ref 8.9–10.3)
Creatinine, Ser: 1.84 mg/dL — ABNORMAL HIGH (ref 0.44–1.00)
GFR calc Af Amer: 30 mL/min — ABNORMAL LOW (ref >60)
GFR calc non Af Amer: 26 mL/min — ABNORMAL LOW (ref >60)
GLUCOSE: 102 mg/dL — AB (ref 65–99)
POTASSIUM: 4.6 mmol/L (ref 3.5–5.1)
Sodium: 141 mmol/L (ref 135–145)

## 2017-03-22 LAB — CBC WITH DIFFERENTIAL/PLATELET
BASOS ABS: 0 10*3/uL (ref 0.0–0.1)
Basophils Relative: 1 %
Eosinophils Absolute: 0.1 10*3/uL (ref 0.0–0.7)
Eosinophils Relative: 2 %
HEMATOCRIT: 26.2 % — AB (ref 36.0–46.0)
Hemoglobin: 8 g/dL — ABNORMAL LOW (ref 12.0–15.0)
LYMPHS ABS: 1.5 10*3/uL (ref 0.7–4.0)
LYMPHS PCT: 37 %
MCH: 26.8 pg (ref 26.0–34.0)
MCHC: 30.5 g/dL (ref 30.0–36.0)
MCV: 87.6 fL (ref 78.0–100.0)
Monocytes Absolute: 0.3 10*3/uL (ref 0.1–1.0)
Monocytes Relative: 8 %
NEUTROS ABS: 2.2 10*3/uL (ref 1.7–7.7)
Neutrophils Relative %: 52 %
Platelets: 315 10*3/uL (ref 150–400)
RBC: 2.99 MIL/uL — AB (ref 3.87–5.11)
RDW: 14.3 % (ref 11.5–15.5)
WBC: 4.2 10*3/uL (ref 4.0–10.5)

## 2017-03-22 LAB — I-STAT TROPONIN, ED: Troponin i, poc: 0.17 ng/mL (ref 0.00–0.08)

## 2017-03-22 MED ORDER — DOXYCYCLINE HYCLATE 100 MG PO CAPS: 100.0000 mg | ORAL_CAPSULE | Freq: Two times a day (BID) | ORAL | 0 refills | Status: DC

## 2017-03-22 MED ORDER — DOXYCYCLINE HYCLATE 100 MG PO TABS
100.0000 mg | ORAL_TABLET | Freq: Once | ORAL | Status: AC
  Administered 2017-03-22: 100 mg via ORAL
  Filled 2017-03-22: qty 1

## 2017-03-22 NOTE — ED Triage Notes (Signed)
Patient complains of increased chest pain with cough and congestion. Patient has trach since November and states that the mucus is heavy and now has left anterior CP with the congestion and bodyaches, denies fever

## 2017-03-22 NOTE — Discharge Instructions (Signed)
Take doxycycline as prescribed. Return if you are worse at any time.   Call Dr. Erik Obey tomorrow for recheck this week.

## 2017-03-22 NOTE — ED Provider Notes (Signed)
[facility] DEPT Provider Note   CSN: [id number] Arrival date & time: 03/22/17  1131     History   Chief Complaint Chief Complaint  Patient presents with  . Shortness of Breath    HPI [name] is a 71 y.o. female.  HPI  This is a 71 year old female history of the HF, anemia, heart block, GERD, status post tracheostomy presents today complaining of dyspnea. She has had some coughing since the trach was placed. She states that she became more dyspneic. Her cough is nonproductive. She has some generalized body aches. She has some sharp anterior chest pain. She denies headache, head injury, throat pain, abdominal pain, or swelling in her legs.  Past Medical History:  Diagnosis Date  . Anemia   . Angina   . Angioedema    2/2 ACE  . Arteriovenous malformation of stomach   . Arthritis   . Asthma   . AVM (arteriovenous malformation) of colon    small intestine; stomach  . Blood transfusion   . CHF (congestive heart failure) ([place])   . Complete heart block ([place])    s/p PPM 1998  . Complication of anesthesia    Difficult airway; anaphylaxis and swelling with propofol  . DDD (degenerative disc disease)   . Depression   . Diastolic heart failure   . Fatty liver 07/26/10  . GERD (gastroesophageal reflux disease)   . GI bleed   . Headache   . History of alcohol abuse Stopped Fall 2012  . History of tobacco use Quit Fall 2012  . Hx of cardiovascular stress test    a. Lexiscan Myoview (10/15):  Small inferolateral and apical defect c/w scar and poss soft tissue attenuation, no ischemia, EF 42%  . Hx of colonic polyp 08/13/10   adenomatous  . Hx of colonoscopy   . Hyperlipidemia   . Hyperlipidemia   . Hypertension   . Hypertrophic cardiomyopathy ([place])    dx by Dr [name] 2009  . Iron deficiency anemia   . Panic attack   . Panic attacks   . Permanent atrial fibrillation ([place])   . Pneumonia   . Renal failure    baseline creatinine 1.6  . Right arm pain  01/08/2012  . RLS (restless legs syndrome)    Dx 06/2007  . Shortness of breath    sob on exertation  . Sleep apnea     Patient Active Problem List   Diagnosis Date Noted  . Chronic idiopathic constipation 03/19/2017  . Loss of weight 01/26/2017  . Diabetes mellitus, type 2 ([place]) 11/05/2016  . 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 Permanent atrial fibrillation ([place])   . Laryngeal stenosis 09/18/2016  . Somnolence   . Acute respiratory failure with hypoxia ([place])   . Palpitations   . Dyspnea on exertion   . Polymyalgia rheumatica ([place]) 04/10/2015  . Low back pain 03/28/2015  . Hypertrophic cardiomyopathy ([place]) 02/15/2015  . Eczema 09/18/2014  . Environmental allergies 09/18/2014  . Pre-diabetes 11/26/2013  . COPD, moderate ([place]) 11/01/2013  . Cough 07/15/2013  . Preventative health care 08/18/2012  . Complete heart block ([place]) 04/03/2011  . Anemia 02/17/2011  . AVM (arteriovenous malformation) 02/13/2011  . PACEMAKER-[place] 11/28/2010  . CHRONIC KIDNEY DISEASE STAGE III (MODERATE) 09/24/2010  . Acute on chronic congestive heart failure ([place]) 08/30/2010  . ARTHRITIS 07/24/2010  . Generalized anxiety disorder 07/23/2010  . Hyperlipidemia 02/11/2007  . Essential hypertension 02/11/2007  . APNEA, SLEEP 02/11/2007    Past Surgical History:  Procedure Laterality  Date  . CARDIAC CATHETERIZATION    . CARDIAC CATHETERIZATION N/A 08/19/2016   Procedure: Right/Left Heart Cath and Coronary Angiography;  Surgeon: Jolaine Artist, MD;  Location: [facility];  Service: Cardiovascular;  Laterality: N/A;  . DIRECT LARYNGOSCOPY N/A 09/18/2016   Procedure: DIRECT LARYNGOSCOPY, BRONCHOSCOPY, REMOVAL OF INTUBATION GRANULOMA;  Surgeon: [name], MD;  Location: [facility] OR;  Service: ENT;  Laterality: N/A;  . DIRECT LARYNGOSCOPY N/A 10/20/2016   Procedure: EXTUBATION AND FLEXIBLE LARYNGOSCOPE;  Surgeon: [name], MD;  Location: [facility];  Service: ENT;  Laterality: N/A;  . DIRECT  LARYNGOSCOPY N/A 10/29/2016   Procedure: DIRECT LARYNGOSCOPY;  Surgeon: [name], MD;  Location: [facility];  Service: ENT;  Laterality: N/A;  . ESOPHAGOGASTRODUODENOSCOPY  12/23/2011   Procedure: ESOPHAGOGASTRODUODENOSCOPY (EGD);  Surgeon: [name], MD;  Location: [facility];  Service: Endoscopy;  Laterality: N/A;  . EXTUBATION (ENDOTRACHEAL) IN OR N/A 07/21/2016   Procedure: EXTUBATION (ENDOTRACHEAL) IN OR;  Surgeon: [name], MD;  Location: [facility];  Service: ENT;  Laterality: N/A;  . GIVENS CAPSULE STUDY  12/23/2011   Procedure: GIVENS CAPSULE STUDY;  Surgeon: [name], MD;  Location: [facility];  Service: Endoscopy;  Laterality: N/A;  . MICROLARYNGOSCOPY WITH LASER N/A 10/16/2016   Procedure: MICRODIRECTLARYNGOSCOPY WITH LASER ABLATION AND KENLOG INJECTION;  Surgeon: [name], MD;  Location: [facility];  Service: ENT;  Laterality: N/A;  . PACEMAKER INSERTION  1998   st jude, most recent gen change by [name] 4/12  . TRACHEOSTOMY TUBE PLACEMENT N/A 10/29/2016   Procedure: TRACHEOSTOMY;  Surgeon: [name], MD;  Location: [facility];  Service: ENT;  Laterality: N/A;  . TUBAL LIGATION  04/01/2000    OB History    No data available       Home Medications    Prior to Admission medications   Medication Sig Start Date End Date Taking? Authorizing Provider  acetaminophen (TYLENOL) 325 MG tablet Take 325 mg by mouth daily as needed (pain).    Historical Provider, MD  albuterol (PROVENTIL) (2.5 MG/3ML) 0.083% nebulizer solution Take 3 mLs (2.5 mg total) by nebulization every 6 (six) hours as needed for wheezing or shortness of breath. 08/11/16   Dinah C Ngetich, NP  ALPRAZolam (XANAX) 0.5 MG tablet Take 1 tablet (0.5 mg total) by mouth at bedtime as needed for anxiety. 01/26/17   Steve Rattler, DO  aspirin EC 81 MG tablet Take 81 mg by mouth every morning.     Historical Provider, MD  azithromycin (ZITHROMAX) 250 MG tablet Take 1 tablet (250 mg total) by mouth daily. 01/04/17   Everlene Balls, MD  bisacodyl (DULCOLAX) 10 MG suppository Place 10 mg rectally daily as needed for mild constipation.     Historical Provider, MD  CALCIUM PO Take 1 tablet by mouth once a week.     Historical Provider, MD  cephALEXin (KEFLEX) 500 MG capsule Take 1 capsule (500 mg total) by mouth 2 (two) times daily. 11/28/16   Alphonzo Grieve, MD  dextromethorphan 15 MG/5ML syrup Take 10 mLs (30 mg total) by mouth 4 (four) times daily as needed for cough. 02/09/17   Steve Rattler, DO  dextromethorphan-guaiFENesin (MUCINEX DM) 30-600 MG 12hr tablet Take 1 tablet by mouth 2 (two) times daily as needed for cough. 01/14/17   Katheren Shams, DO  feeding supplement, ENSURE ENLIVE, (ENSURE ENLIVE) LIQD Take 237 mLs by mouth 2 (two) times daily between meals. 11/15/16   Sela Hilding, MD  ferrous sulfate 325 (65 FE) MG tablet TAKE 1 TABLET BY MOUTH TWICE DAILY WITH MEALS TO KEEP BLOOD COUNT UP Patient taking differently: Take 325 mg by mouth 2 (two) times daily with a meal. TO KEEP BLOOD COUNT UP 08/26/16   Steve Rattler, DO  furosemide (LASIX) 40 MG tablet Take 1 tablet (40 mg total) by mouth daily. Take one tablet daily 01/28/17   Steve Rattler, DO  nitroGLYCERIN (NITROSTAT) 0.4 MG SL tablet Place 1 tablet (0.4 mg total) under the tongue every 5 (five) minutes as needed for chest pain. 01/18/15   Eileen Stanford, PA-C  omeprazole (PRILOSEC) 20 MG capsule Take 1 capsule (20 mg total) by mouth daily. Patient taking differently: Take 20 mg by mouth daily as needed (heartburn).  08/26/16   Steve Rattler, DO  polyethylene glycol (MIRALAX / GLYCOLAX) packet Take 17 g by mouth daily. Patient taking differently: Take 17 g by mouth daily as needed for mild constipation.  07/26/16   Domenic Polite, MD  potassium chloride (K-DUR) 10 MEQ tablet Take 2 tablets (20 mEq total) by mouth daily. 12/26/16   Nicolette Bang, DO  rosuvastatin (CRESTOR) 20 MG tablet Take 1 tablet (20 mg total) by mouth daily. 08/26/16    Steve Rattler, DO  senna (SENOKOT) 8.6 MG TABS tablet Take 1 tablet (8.6 mg total) by mouth at bedtime. 03/19/17   Steve Rattler, DO  trolamine salicylate (ASPERCREME) 10 % cream Apply 1 application topically 2 (two) times daily as needed for muscle pain.     Historical Provider, MD    Family History Family History  Problem Relation Age of Onset  . Hypertension Mother   . Stroke Mother   . Heart disease Mother   . Aneurysm Mother   . CVA Mother   . Hypertension Father   . Stroke Father   . Heart attack Father   . Alcohol abuse Father   . Hypertension Sister   . Hypertension Brother   . Colon cancer Brother   . Diabetes Sister   . Diabetes Brother   . Cancer Brother   . Heart attack Brother   . Heart disease Sister   . Alcohol abuse Brother     Social History Social History  Substance Use Topics  . Smoking status: Former Smoker    Packs/day: 0.10    Years: 50.00    Types: Cigarettes    Quit date: 08/16/2011  . Smokeless tobacco: Never Used     Comment: quit 2012  . Alcohol use 0.0 oz/week     Comment: quit 08/16/2011     Allergies   Ace inhibitors; Other; and Propofol   Review of Systems Review of Systems  All other systems reviewed and are negative.    Physical Exam Updated Vital Signs BP 110/72   Pulse 70   Temp 97.9 F (36.6 C) (Oral)   Resp 20   SpO2 100%   Physical Exam  Constitutional: She appears well-developed and well-nourished.  HENT:  Head: Normocephalic and atraumatic.  Eyes: Pupils are equal, round, and reactive to light.  Neck: Normal range of motion.  Tracheostomy in place with no surrounding erythema or discharge.  Cardiovascular: Normal rate and regular rhythm.   Pulmonary/Chest: Effort normal and breath sounds normal.  Abdominal: Soft. Bowel sounds are normal.  Musculoskeletal: Normal range of motion.  Nursing note and vitals reviewed.    ED Treatments / Results  Labs (all labs ordered are listed, but only abnormal  results  are displayed) Labs Reviewed  CBC WITH DIFFERENTIAL/PLATELET - Abnormal; Notable for the following:       Result Value   RBC 2.99 (*)    Hemoglobin 8.0 (*)    HCT 26.2 (*)    All other components within normal limits  BASIC METABOLIC PANEL - Abnormal; Notable for the following:    Glucose, Bld 102 (*)    BUN 42 (*)    Creatinine, Ser 1.84 (*)    GFR calc non Af Amer 26 (*)    GFR calc Af Amer 30 (*)    All other components within normal limits  I-STAT TROPOININ, ED - Abnormal; Notable for the following:    Troponin i, poc 0.17 (*)    All other components within normal limits    EKG  EKG Interpretation None       Radiology Dg Chest 2 View  Result Date: 03/22/2017 CLINICAL DATA:  Shortness of breath and cough. EXAM: CHEST  2 VIEW COMPARISON:  January 04, 2017 FINDINGS: Pacemaker leads are stable. Tracheostomy tube is stable. Cardiomegaly, unchanged. No pneumothorax. No pulmonary nodules, masses, or focal infiltrates. IMPRESSION: No active cardiopulmonary disease. Electronically Signed   By: Dorise Bullion III M.D   On: 03/22/2017 12:49    Procedures Procedures (including critical care time)  Medications Ordered in ED Medications - No data to display   Initial Impression / Assessment and Plan / ED Course  I have reviewed the triage vital signs and the nursing notes.  Pertinent labs & imaging results that were available during my care of the patient were reviewed by me and considered in my medical decision making (see chart for details).     71 year old female history of tracheostomy comes in today complaining of some body aches and dyspnea. Evaluation here reveals no evidence of fever. Blood work reveals stable anemia with no leukocytosis. Troponin is slightly elevated but review reveals chronic elevation of troponin. Tracheostomy site appears clear without erythema but patient reports some yellow sputum and discharge. Patient remained hemodynamically stable here with  oxygen saturations in the mid to upper 90s on room air. She is given albuterol and was placed on antibiotics. We have discussed return precautions and need for follow-up and she voices understanding.  Final Clinical Impressions(s) / ED Diagnoses   Final diagnoses:  Bronchitis    New Prescriptions New Prescriptions   No medications on file     Pattricia Boss, MD 03/22/17 1535

## 2017-03-22 NOTE — ED Notes (Signed)
Pt is in stable condition upon d/c and ambulates from ED. 

## 2017-03-31 DIAGNOSIS — G4733 Obstructive sleep apnea (adult) (pediatric): Secondary | ICD-10-CM | POA: Diagnosis not present

## 2017-03-31 DIAGNOSIS — Z430 Encounter for attention to tracheostomy: Secondary | ICD-10-CM | POA: Diagnosis not present

## 2017-03-31 DIAGNOSIS — Z9889 Other specified postprocedural states: Secondary | ICD-10-CM | POA: Diagnosis not present

## 2017-03-31 DIAGNOSIS — J386 Stenosis of larynx: Secondary | ICD-10-CM | POA: Diagnosis not present

## 2017-04-04 DIAGNOSIS — Z930 Tracheostomy status: Secondary | ICD-10-CM | POA: Diagnosis not present

## 2017-04-04 DIAGNOSIS — J449 Chronic obstructive pulmonary disease, unspecified: Secondary | ICD-10-CM | POA: Diagnosis not present

## 2017-04-06 ENCOUNTER — Encounter: Payer: Self-pay | Admitting: Internal Medicine

## 2017-04-07 ENCOUNTER — Ambulatory Visit (INDEPENDENT_AMBULATORY_CARE_PROVIDER_SITE_OTHER): Payer: Medicare Other | Admitting: Internal Medicine

## 2017-04-07 ENCOUNTER — Other Ambulatory Visit: Payer: Self-pay | Admitting: Internal Medicine

## 2017-04-07 ENCOUNTER — Encounter: Payer: Self-pay | Admitting: Internal Medicine

## 2017-04-07 VITALS — BP 108/68 | HR 75 | Ht 65.0 in | Wt 138.4 lb

## 2017-04-07 DIAGNOSIS — D649 Anemia, unspecified: Secondary | ICD-10-CM

## 2017-04-07 DIAGNOSIS — I442 Atrioventricular block, complete: Secondary | ICD-10-CM

## 2017-04-07 DIAGNOSIS — Z950 Presence of cardiac pacemaker: Secondary | ICD-10-CM

## 2017-04-07 DIAGNOSIS — I4821 Permanent atrial fibrillation: Secondary | ICD-10-CM

## 2017-04-07 DIAGNOSIS — Z8719 Personal history of other diseases of the digestive system: Secondary | ICD-10-CM

## 2017-04-07 DIAGNOSIS — I5032 Chronic diastolic (congestive) heart failure: Secondary | ICD-10-CM

## 2017-04-07 DIAGNOSIS — I482 Chronic atrial fibrillation: Secondary | ICD-10-CM | POA: Diagnosis not present

## 2017-04-07 DIAGNOSIS — R002 Palpitations: Secondary | ICD-10-CM

## 2017-04-07 MED ORDER — METOPROLOL TARTRATE 25 MG PO TABS: ORAL_TABLET | ORAL | 1 refills | Status: DC

## 2017-04-07 NOTE — Progress Notes (Signed)
PCP: Steve Rattler, DO Primary Cardiologist:  Dr Arita Miss is a 71 y.o. female who presents today for electrophysiology followup.  She has made slow but steady progress since her hospital discharge.  She is followed by ENT for tracheal stenosis following TEE by Dr Johnsie Cancel which was performed as screening for watchman.  She has lost 50 lbs over the past year.  She is concerned with ongoing weight loss.  This is being evaluated by primary care.  She states that she has tried ensure/ boost.  She has rare palpitations.  Today, she denies symptoms of CP, lower extremity edema, dizziness, presyncope, or syncope.  She remains quite anemic.  She has not seen GI since Dr [name] retired.  The patient is otherwise without complaint today.   Past Medical History:  Diagnosis Date  . Anemia   . Angina   . Angioedema    2/2 ACE  . Arteriovenous malformation of stomach   . Arthritis   . Asthma   . AVM (arteriovenous malformation) of colon    small intestine; stomach  . Blood transfusion   . CHF (congestive heart failure) ([place])   . Complete heart block ([place])    s/p PPM 1998  . Complication of anesthesia    Difficult airway; anaphylaxis and swelling with propofol  . DDD (degenerative disc disease)   . Depression   . Diastolic heart failure   . Fatty liver 07/26/10  . GERD (gastroesophageal reflux disease)   . GI bleed   . Headache   . History of alcohol abuse Stopped Fall 2012  . History of tobacco use Quit Fall 2012  . Hx of cardiovascular stress test    a. Lexiscan Myoview (10/15):  Small inferolateral and apical defect c/w scar and poss soft tissue attenuation, no ischemia, EF 42%  . Hx of colonic polyp 08/13/10   adenomatous  . Hx of colonoscopy   . Hyperlipidemia   . Hyperlipidemia   . Hypertension   . Hypertrophic cardiomyopathy ([place])    dx by Dr [name] 2009  . Iron deficiency anemia   . Panic attack   . Panic attacks   . Permanent atrial fibrillation ([place])    . Pneumonia   . Renal failure    baseline creatinine 1.6  . Right arm pain 01/08/2012  . RLS (restless legs syndrome)    Dx 06/2007  . Shortness of breath    sob on exertation  . Sleep apnea    Past Surgical History:  Procedure Laterality Date  . CARDIAC CATHETERIZATION    . CARDIAC CATHETERIZATION N/A 08/19/2016   Procedure: Right/Left Heart Cath and Coronary Angiography;  Surgeon: Jolaine Artist, MD;  Location: [facility];  Service: Cardiovascular;  Laterality: N/A;  . DIRECT LARYNGOSCOPY N/A 09/18/2016   Procedure: DIRECT LARYNGOSCOPY, BRONCHOSCOPY, REMOVAL OF INTUBATION GRANULOMA;  Surgeon: [name], MD;  Location: [facility] OR;  Service: ENT;  Laterality: N/A;  . DIRECT LARYNGOSCOPY N/A 10/20/2016   Procedure: EXTUBATION AND FLEXIBLE LARYNGOSCOPE;  Surgeon: [name], MD;  Location: [facility];  Service: ENT;  Laterality: N/A;  . DIRECT LARYNGOSCOPY N/A 10/29/2016   Procedure: DIRECT LARYNGOSCOPY;  Surgeon: [name], MD;  Location:  [facility] OR;  Service: ENT;  Laterality: N/A;  . ESOPHAGOGASTRODUODENOSCOPY  12/23/2011   Procedure: ESOPHAGOGASTRODUODENOSCOPY (EGD);  Surgeon: [name], MD;  Location: [facility];  Service: Endoscopy;  Laterality: N/A;  . EXTUBATION (ENDOTRACHEAL) IN OR N/A 07/21/2016   Procedure: EXTUBATION (ENDOTRACHEAL) IN  OR;  Surgeon: [name], MD;  Location: [facility];  Service: ENT;  Laterality: N/A;  . GIVENS CAPSULE STUDY  12/23/2011   Procedure: GIVENS CAPSULE STUDY;  Surgeon: [name], MD;  [facility]: [facility];  Service: Endoscopy;  Laterality: N/A;  . MICROLARYNGOSCOPY WITH LASER N/A 10/16/2016   Procedure: MICRODIRECTLARYNGOSCOPY WITH LASER ABLATION AND KENLOG INJECTION;  Surgeon: [name], MD;  Location: [facility];  Service: ENT;  Laterality: N/A;  . PACEMAKER INSERTION  1998   st jude, most recent gen change by [name] 4/12  . TRACHEOSTOMY TUBE PLACEMENT N/A 10/29/2016   Procedure: TRACHEOSTOMY;  Surgeon: [name], MD;  Location: [facility];   Service: ENT;  Laterality: N/A;  . TUBAL LIGATION  04/01/2000    Current Outpatient Prescriptions  Medication Sig Dispense Refill  . acetaminophen (TYLENOL) 325 MG tablet Take 325 mg by mouth daily as needed (pain).    [name] albuterol (PROVENTIL) (2.5 MG/3ML) 0.083% nebulizer solution Take 3 mLs (2.5 mg total) by nebulization every 6 (six) hours as needed for wheezing or shortness of breath. 150 mL 1  . ALPRAZolam (XANAX) 0.5 MG tablet Take 1 tablet (0.5 mg total) by mouth at bedtime as needed for anxiety. 15 tablet 2  . aspirin EC 81 MG tablet Take 81 mg by mouth every morning.     . bisacodyl (DULCOLAX) 10 MG suppository Place 10 mg rectally daily as needed for mild constipation.     [name] dextromethorphan-guaiFENesin (MUCINEX DM) 30-600 MG 12hr tablet Take 1 tablet by mouth 2 (two) times daily as needed for cough. 30 tablet 2  . feeding supplement, ENSURE ENLIVE, (ENSURE ENLIVE) LIQD Take 237 mLs by mouth 2 (two) times daily between meals. 237 mL 12  . ferrous sulfate 325 (65 FE) MG tablet TAKE 1 TABLET BY MOUTH TWICE DAILY WITH MEALS TO KEEP BLOOD COUNT UP (Patient taking differently: Take 325 mg by mouth 2 (two) times daily with a meal. TO KEEP BLOOD COUNT UP) 60 tablet 11  . furosemide (LASIX) 40 MG tablet Take 1 tablet (40 mg total) by mouth daily. Take one tablet daily 90 tablet 3  . nitroGLYCERIN (NITROSTAT) 0.4 MG SL tablet Place 1 tablet (0.4 mg total) under the tongue every 5 (five) minutes as needed for chest pain. 25 tablet 1  . omeprazole (PRILOSEC) 20 MG capsule Take 1 capsule (20 mg total) by mouth daily. 30 capsule 6  . polyethylene glycol (MIRALAX / GLYCOLAX) packet Take 17 g by mouth daily. 14 each 0  . potassium chloride (K-DUR) 10 MEQ tablet Take 40 mEq by mouth daily.    . rosuvastatin (CRESTOR) 20 MG tablet Take 1 tablet (20 mg total) by mouth daily. 90 tablet 3  . senna (SENOKOT) 8.6 MG TABS tablet Take 1 tablet (8.6 mg total) by mouth at bedtime. 30 each 2  . trolamine  salicylate (ASPERCREME) 10 % cream Apply 1 application topically 2 (two) times daily as needed for muscle pain.     [name] CALCIUM PO Take 1 tablet by mouth once a week.      No current facility-administered medications for this visit.    ROS- all systems are reviewed and negative except as per HPI above  Physical Exam: Vitals:   04/07/17 1459  BP: 108/68  Pulse: 75  SpO2: 99%  Weight: 138 lb 6.4 oz (62.8 kg)  Height: 5\' 5"  (1.651 m)    GEN- The patient is well appearing, she has lost a lot of weight, alert and  oriented x 3 today.   Head- normocephalic, atraumatic Eyes-  Sclera clear, conjunctiva pink Ears- hearing intact Oropharynx- clear Neck- no stridor Lungs- Clear to ausculation bilaterally, normal work of breathing Chest- pacemaker pocket is well healed Heart- Regular rate and rhythm, (paced) GI- soft, NT, ND, + BS Extremities- no clubbing, cyanosis, or edema  Pacemaker interrogation- personally reviewed in detail today,  See PACEART report  Assessment and Plan:  1. Complete heart block Normal pacemaker function See Pace Art report 100% V paced.    2. Permanent afib Not a candidate for long term anticoagulation due to prior GI bleeding or for watchman given recent difficulty with screening TEE.  I would favor a conservative approach going forward.  I have given prn metoprolol for palpitations (these are like due to pvcs)  3. HTN Stable No change required today  4. Atypical chest pain (chronic) Prior cath revealed normal cors  5. Chronic systolic dysfunction/ hocm- Stable No change required today  5. Chronic blood loss anemia/ iron deficiency Not a candidate for long term anticoagulation Labs from April are reviewed She needs to re-establish with GI  6. Tracheal stenosis Appreciate ENTs assistance.  Follow-up with Chanetta Marshall NP in 3 months Follow-up with Dr Oval Linsey as scheduled   Thompson Grayer MD, [facility] 04/07/2017 3:22 PM

## 2017-04-07 NOTE — Patient Instructions (Addendum)
Medication Instructions:  Your physician has recommended you make the following change in your medication:  1) Take Metoprolol 25 mg 1 tablet by mouth every 6 hours as needed for heart palpitaions   Labwork: None ordered   Testing/Procedures: None ordered   Follow-Up:  You have been referred to [facility]---previously seen by Dr [name]  Your physician recommends that you schedule a follow-up appointment in: 3 months with Chanetta Marshall   Any Other Special Instructions Will Be Listed Below (If Applicable).     If you need a refill on your cardiac medications before your next appointment, please call your pharmacy.

## 2017-04-09 ENCOUNTER — Ambulatory Visit (INDEPENDENT_AMBULATORY_CARE_PROVIDER_SITE_OTHER): Payer: Medicare Other | Admitting: Gastroenterology

## 2017-04-09 ENCOUNTER — Encounter: Payer: Self-pay | Admitting: Gastroenterology

## 2017-04-09 ENCOUNTER — Other Ambulatory Visit (INDEPENDENT_AMBULATORY_CARE_PROVIDER_SITE_OTHER): Payer: Medicare Other

## 2017-04-09 VITALS — BP 122/70 | HR 70 | Ht 65.0 in | Wt 138.8 lb

## 2017-04-09 DIAGNOSIS — K5900 Constipation, unspecified: Secondary | ICD-10-CM

## 2017-04-09 DIAGNOSIS — D649 Anemia, unspecified: Secondary | ICD-10-CM | POA: Diagnosis not present

## 2017-04-09 DIAGNOSIS — Z800 Family history of malignant neoplasm of digestive organs: Secondary | ICD-10-CM

## 2017-04-09 DIAGNOSIS — R634 Abnormal weight loss: Secondary | ICD-10-CM | POA: Diagnosis not present

## 2017-04-09 LAB — CBC WITH DIFFERENTIAL/PLATELET
BASOS ABS: 0 10*3/uL (ref 0.0–0.1)
Basophils Relative: 0.6 % (ref 0.0–3.0)
Eosinophils Absolute: 0.1 10*3/uL (ref 0.0–0.7)
Eosinophils Relative: 1.7 % (ref 0.0–5.0)
HEMATOCRIT: 28.8 % — AB (ref 36.0–46.0)
Hemoglobin: 9.5 g/dL — ABNORMAL LOW (ref 12.0–15.0)
LYMPHS ABS: 1.8 10*3/uL (ref 0.7–4.0)
Lymphocytes Relative: 39.5 % (ref 12.0–46.0)
MCHC: 33.1 g/dL (ref 30.0–36.0)
MCV: 82.8 fl (ref 78.0–100.0)
MONOS PCT: 10.7 % (ref 3.0–12.0)
Monocytes Absolute: 0.5 10*3/uL (ref 0.1–1.0)
NEUTROS PCT: 47.5 % (ref 43.0–77.0)
Neutro Abs: 2.2 10*3/uL (ref 1.4–7.7)
Platelets: 242 10*3/uL (ref 150.0–400.0)
RBC: 3.48 Mil/uL — AB (ref 3.87–5.11)
RDW: 14.7 % (ref 11.5–15.5)
WBC: 4.6 10*3/uL (ref 4.0–10.5)

## 2017-04-09 LAB — FOLATE: Folate: >24 ng/mL (ref >5.9)

## 2017-04-09 LAB — IBC PANEL
Iron: 24 ug/dL — ABNORMAL LOW (ref 42–145)
SATURATION RATIOS: 6.4 % — AB (ref 20.0–50.0)
Transferrin: 269 mg/dL (ref 212.0–360.0)

## 2017-04-09 LAB — VITAMIN B12: Vitamin B-12: 435 pg/mL (ref 211–911)

## 2017-04-09 LAB — FERRITIN: Ferritin: 81.6 ng/mL (ref 10.0–291.0)

## 2017-04-09 NOTE — Progress Notes (Addendum)
[name]    [id number]    04-12-1946  Primary Care Physician:[name] Pricilla Riffle, DO  Referring Physician: Steve Rattler, DO [place], [place] [address]  Chief complaint:  Anemia   HPI: 71 year old African-American female previously followed by [name] with known gastric AVMs here to reestablish care with worsening anemia. She was last seen in office 07/11/2013 by [name].  Patient has multiple medical problems including atrial fibrillation, congestive heart failure, chronic kidney disease, status post pacemaker, tracheostomy.  Last EGD in January 2013 with gastric AVM status post APC. History of adenomatous colon polyps and family history of colon cancer (brother diagnosed with colon cancer in his 36s). Last colonoscopy in August 2011 with removal of 4 small polyps is due for surveillance colonoscopy in 2016. Capsule endoscopy January 2013 was poor prep with no significant lesion to explain iron deficiency anemia. Inpatient GI team was consulted when patient was admitted in November 2017 for chronic iron deficiency anemia, she was noted to be fecal Hemoccult negative at the time. She continues to have progressive decline in hemoglobin, most recent hemoglobin 8. Patient complains of loss of appetite and progressive weight loss. No dysphagia, vomiting, abdominal pain, melena or blood per rectum.   Outpatient Encounter Prescriptions as of 04/09/2017  Medication Sig  . acetaminophen (TYLENOL) 325 MG tablet Take 325 mg by mouth daily as needed (pain).  [name] albuterol (PROVENTIL) (2.5 MG/3ML) 0.083% nebulizer solution Take 3 mLs (2.5 mg total) by nebulization every 6 (six) hours as needed for wheezing or shortness of breath.  . ALPRAZolam (XANAX) 0.5 MG tablet Take 1 tablet (0.5 mg total) by mouth at bedtime as needed for anxiety.  [name] aspirin EC 81 MG tablet Take 81 mg by mouth every morning.   . bisacodyl (DULCOLAX) 10 MG suppository Place 10 mg rectally  daily as needed for mild constipation.   [name] CALCIUM PO Take 1 tablet by mouth once a week.   . feeding supplement, ENSURE ENLIVE, (ENSURE ENLIVE) LIQD Take 237 mLs by mouth 2 (two) times daily between meals.  . ferrous sulfate 325 (65 FE) MG tablet TAKE 1 TABLET BY MOUTH TWICE DAILY WITH MEALS TO KEEP BLOOD COUNT UP (Patient taking differently: Take 325 mg by mouth 2 (two) times daily with a meal. TO KEEP BLOOD COUNT UP)  . furosemide (LASIX) 40 MG tablet Take 1 tablet (40 mg total) by mouth daily. Take one tablet daily  . metoprolol tartrate (LOPRESSOR) 25 MG tablet TAKE 1 TABLET BY MOUTH EVERY 6 HOURS AS NEEDED FOR HEART FLUTTERING  . nitroGLYCERIN (NITROSTAT) 0.4 MG SL tablet Place 1 tablet (0.4 mg total) under the tongue every 5 (five) minutes as needed for chest pain.  [name] omeprazole (PRILOSEC) 20 MG capsule Take 1 capsule (20 mg total) by mouth daily.  . polyethylene glycol (MIRALAX / GLYCOLAX) packet Take 17 g by mouth daily.  . potassium chloride (K-DUR) 10 MEQ tablet Take 40 mEq by mouth daily.  . rosuvastatin (CRESTOR) 20 MG tablet Take 1 tablet (20 mg total) by mouth daily.  [name] senna (SENOKOT) 8.6 MG TABS tablet Take 1 tablet (8.6 mg total) by mouth at bedtime.  . trolamine salicylate (ASPERCREME) 10 % cream Apply 1 application topically 2 (two) times daily as needed for muscle pain.   . [DISCONTINUED] dextromethorphan-guaiFENesin (MUCINEX DM) 30-600 MG 12hr tablet Take 1 tablet by mouth 2 (two) times daily as needed for cough. (Patient not  taking: Reported on 04/09/2017)   No facility-administered encounter medications on file as of 04/09/2017.     Allergies as of 04/09/2017 - Review Complete 04/09/2017  Allergen Reaction Noted  . Ace inhibitors Swelling 03/15/2008  . Other Anaphylaxis and Swelling 10/15/2016  . Propofol Anaphylaxis and Swelling 07/15/2016    Past Medical History:  Diagnosis Date  . Anemia   . Angina   . Angioedema    2/2 ACE  . Arteriovenous malformation of  stomach   . Arthritis   . Asthma   . AVM (arteriovenous malformation) of colon    small intestine; stomach  . Blood transfusion   . CHF (congestive heart failure) ([place])   . Complete heart block ([place])    s/p PPM 1998  . Complication of anesthesia    Difficult airway; anaphylaxis and swelling with propofol  . DDD (degenerative disc disease)   . Depression   . Diastolic heart failure   . Fatty liver 07/26/10  . GERD (gastroesophageal reflux disease)   . GI bleed   . Headache   . History of alcohol abuse Stopped Fall 2012  . History of tobacco use Quit Fall 2012  . Hx of cardiovascular stress test    a. Lexiscan Myoview (10/15):  Small inferolateral and apical defect c/w scar and poss soft tissue attenuation, no ischemia, EF 42%  . Hx of colonic polyp 08/13/10   adenomatous  . Hx of colonoscopy   . Hyperlipidemia   . Hyperlipidemia   . Hypertension   . Hypertrophic cardiomyopathy ([place])    dx by Dr Olevia [name] 2009  . Iron deficiency anemia   . Panic attack   . Panic attacks   . Permanent atrial fibrillation ([place])   . Pneumonia   . Renal failure    baseline creatinine 1.6  . Right arm pain 01/08/2012  . RLS (restless legs syndrome)    Dx 06/2007  . Shortness of breath    sob on exertation  . Sleep apnea     Past Surgical History:  Procedure Laterality Date  . CARDIAC CATHETERIZATION    . CARDIAC CATHETERIZATION N/A 08/19/2016   Procedure: Right/Left Heart Cath and Coronary Angiography;  Surgeon: [name] Artist, MD;  Location: [facility];  Service: Cardiovascular;  Laterality: N/A;  . DIRECT LARYNGOSCOPY N/A 09/18/2016   Procedure: DIRECT LARYNGOSCOPY, BRONCHOSCOPY, REMOVAL OF INTUBATION GRANULOMA;  Surgeon: [name] Marble, MD;  Location: [facility] OR;  Service: ENT;  Laterality: N/A;  . DIRECT LARYNGOSCOPY N/A 10/20/2016   Procedure: EXTUBATION AND FLEXIBLE LARYNGOSCOPE;  Surgeon: [name] Marble, MD;  Location: [facility];  Service: ENT;  Laterality: N/A;  . DIRECT LARYNGOSCOPY N/A  10/29/2016   Procedure: DIRECT LARYNGOSCOPY;  Surgeon: [name] Marble, MD;  Location: [facility] OR;  Service: ENT;  Laterality: N/A;  . ESOPHAGOGASTRODUODENOSCOPY  12/23/2011   Procedure: ESOPHAGOGASTRODUODENOSCOPY (EGD);  Surgeon: [name], MD;  Location: [facility];  Service: Endoscopy;  Laterality: N/A;  . EXTUBATION (ENDOTRACHEAL) IN OR N/A 07/21/2016   Procedure: EXTUBATION (ENDOTRACHEAL) IN OR;  Surgeon: [name] Marble, MD;  Location: [facility];  Service: ENT;  Laterality: N/A;  . GIVENS CAPSULE STUDY  12/23/2011   Procedure: GIVENS CAPSULE STUDY;  Surgeon: [name], MD;  Location: [facility];  Service: Endoscopy;  Laterality: N/A;  . MICROLARYNGOSCOPY WITH LASER N/A 10/16/2016   Procedure: MICRODIRECTLARYNGOSCOPY WITH LASER ABLATION AND KENLOG INJECTION;  Surgeon: [name] Marble, MD;  Location: [facility];  Service: ENT;  Laterality: N/A;  . PACEMAKER INSERTION  1998   st jude, most recent gen change by [name] 4/12  . TRACHEOSTOMY TUBE PLACEMENT N/A 10/29/2016   Procedure: TRACHEOSTOMY;  Surgeon: [name] Marble, MD;  Location: [facility];  Service: ENT;  Laterality: N/A;  . TUBAL LIGATION  04/01/2000    Family History  Problem Relation Age of Onset  . Hypertension Mother   . Stroke Mother   . Heart disease Mother   . Aneurysm Mother   . CVA Mother   . Hypertension Father   . Stroke Father   . Heart attack Father   . Alcohol abuse Father   . Hypertension Sister   . Hypertension Brother   . Colon cancer Brother   . Diabetes Sister   . Diabetes Brother   . Cancer Brother   . Heart attack Brother   . Heart disease Sister   . Alcohol abuse Brother     Social History   Social History  . Marital status: Divorced    Spouse name: N/A  . Number of children: 4  . Years of education: 9   Occupational History  . disabled Unemployed  . previously- house cleaning    Social History Main Topics  . Smoking status: Former Smoker    Packs/day: 0.10    Years: 50.00    Types: Cigarettes    Quit  date: 08/16/2011  . Smokeless tobacco: Never Used     Comment: quit 2012  . Alcohol use 0.0 oz/week     Comment: quit 08/16/2011  . Drug use: No  . Sexual activity: No   Other Topics Concern  . Not on file   Social History Narrative   On disability for heart failure/pacemaker.     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    4 grown children,  8 grandchildren.     Drinks alcohol a few times a week socially. At one time, the most she reports drinking is about 3 mixed drinks.   Lives with daughter [name] and son. Will be moving 04/2011 to different house because concerned children are taking advantage of her financial situation.    No illicit drugs.   Smokes few cigarettes daily, especially when stressed. Started smoking @ age 30. Quit January 2012 2/2 health but started smoking again 02/2011.       Health Care POA:    Emergency Contact: daughter, Verdene Lennert [phone]   End of Life Plan: gave pt AD info pamphlet   Who lives with you: no one- lives in section 8 housing   Any pets: none   Diet: Pt has a varied diet of protein, starch and vegetables   Exercise: Pt has no regular exercise routine.   Seatbelts: Pt reports wearing seatbelt when in vehicles.    Hobbies: dancing      Review of systems: Review of Systems  Constitutional: Negative for fever and chills.  positive for weight loss HENT: Negative.   Eyes: Negative for blurred vision.  Respiratory: Negative for cough, shortness of breath and wheezing.   Cardiovascular: Negative for chest pain and palpitations.  Gastrointestinal: as per HPI Genitourinary: Negative for dysuria, urgency, frequency and hematuria.  Musculoskeletal: Negative for myalgias, back pain and joint pain.  Skin: Negative for itching and rash.  Neurological: Negative for dizziness, tremors, focal weakness, seizures and loss of consciousness.  positive for frequent headaches Endo/Heme/Allergies: Positive for seasonal allergies.    Psychiatric/Behavioral: Negative for depression, suicidal ideas and hallucinations.  positive for anxiety All other systems reviewed and are negative.  Physical Exam: Vitals:   04/09/17 0833  BP: 122/70  Pulse: 70   Body mass index is 23.1 kg/m. Gen:      No acute distress HEENT:  EOMI, sclera anicteric, trach Neck:     No masses; no thyromegaly Lungs:    Clear to auscultation bilaterally; normal respiratory effort CV:         Regular rate and rhythm; no murmurs Abd:      + bowel sounds; soft, non-tender; no palpable masses, no distension Ext:    No edema; adequate peripheral perfusion Skin:      Warm and dry; no rash Neuro: alert and oriented x 3 Psych: normal mood and affect Rectal exam: Normal anal sphincter tone, no anal fissure or external hemorrhoids Anoscopy: Small internal hemorrhoids, no active bleeding, normal dentate line, no visible nodules  Data Reviewed:  Reviewed labs, radiology imaging, old records and pertinent past GI work up   Assessment and Plan/Recommendations:  71 year old female with multiple comorbidities including status post trach, A. fib, CHF, status post pacemaker ,CKD, chronic anemia, known gastric AVMs status post APC and family history of colon cancer here with complaints of loss of appetite, weight loss and worsening anemia We'll schedule for EGD and colonoscopy for evaluation of worsening anemia and also colorectal cancer screening The risks and benefits as well as alternatives of endoscopic procedure(s) have been discussed and reviewed. All questions answered. The patient agrees to proceed.  Chronic constipation: Continue MiraLAX one capful daily Increase dietary fiber and fluid intake  Follow-up CBC, CMP, iron panel, ferritin, B12 and folate  Return as needed   K. Denzil Magnuson , MD [phone] Mon-Fri 8a-5p [phone] after 5p, weekends, holidays  CC: [name] Rattler, DO

## 2017-04-09 NOTE — Patient Instructions (Signed)
We will contact you with your appointment at [facility] for your colonoscopy /endoscopy , we have already given you instructions , you will need to fill out date and times of appointment when we contact you  Go to the basement for labs today  Continue to use Miralax 1 capsul daily

## 2017-04-10 ENCOUNTER — Telehealth: Payer: Self-pay | Admitting: *Deleted

## 2017-04-10 NOTE — Telephone Encounter (Signed)
Dr Levy Pupa pt procedure on 5/1 at [facility]- pt stated she cant do 5/1 due to her daughter is out of town and she has appointments scheduled next week to close together she wants it to be scheduled after 5/17. Told her I would keep an eye out for cancellations and let her know.[name]

## 2017-04-10 NOTE — Telephone Encounter (Signed)
ok 

## 2017-04-12 ENCOUNTER — Ambulatory Visit (HOSPITAL_BASED_OUTPATIENT_CLINIC_OR_DEPARTMENT_OTHER): Payer: Medicare Other | Attending: Otolaryngology | Admitting: Internal Medicine

## 2017-04-12 VITALS — Ht 65.0 in | Wt 143.0 lb

## 2017-04-12 DIAGNOSIS — Z930 Tracheostomy status: Secondary | ICD-10-CM | POA: Diagnosis not present

## 2017-04-12 DIAGNOSIS — G4730 Sleep apnea, unspecified: Secondary | ICD-10-CM | POA: Diagnosis present

## 2017-04-12 DIAGNOSIS — Z950 Presence of cardiac pacemaker: Secondary | ICD-10-CM | POA: Insufficient documentation

## 2017-04-12 DIAGNOSIS — G4733 Obstructive sleep apnea (adult) (pediatric): Secondary | ICD-10-CM | POA: Diagnosis not present

## 2017-04-12 DIAGNOSIS — G4700 Insomnia, unspecified: Secondary | ICD-10-CM | POA: Diagnosis not present

## 2017-04-16 ENCOUNTER — Ambulatory Visit (INDEPENDENT_AMBULATORY_CARE_PROVIDER_SITE_OTHER): Payer: Medicare Other | Admitting: *Deleted

## 2017-04-16 ENCOUNTER — Encounter: Payer: Self-pay | Admitting: *Deleted

## 2017-04-16 VITALS — BP 108/68 | HR 75 | Temp 98.3°F | Ht 65.0 in | Wt 138.8 lb

## 2017-04-16 DIAGNOSIS — Z0000 Encounter for general adult medical examination without abnormal findings: Secondary | ICD-10-CM | POA: Diagnosis not present

## 2017-04-16 NOTE — Progress Notes (Signed)
[name] was seen in clinic on 04/16/17 for an annual wellness visit. Please see Lauren Ducatte's note.   I have reviewed this visit and discussed with Howell Rucks, RN, BSN, and agree with her documentation.   Lucila Maine, DO PGY-1, [facility] 04/16/2017 4:48 PM

## 2017-04-16 NOTE — Progress Notes (Signed)
Subjective:   [name] is a 71 y.o. female who presents for Medicare Annual (Subsequent) preventive examination.  Cardiac Risk Factors include: advanced age (>64men, >71 women);hypertension;dyslipidemia;smoking/ tobacco exposure;sedentary lifestyle     Objective:     Vitals: BP 108/68 (BP Location: Left Arm, Patient Position: Sitting, Cuff Size: Normal)   Pulse 75   Temp 98.3 F (36.8 C) (Oral)   SpO2 99%   There is no height or weight on file to calculate BMI. WT 138.8 lb HT 5'5" BMI 23.1  Tobacco History  Smoking Status  . Former Smoker  . Packs/day: 0.10  . Years: 50.00  . Types: Cigarettes  . Quit date: 08/16/2011  Smokeless Tobacco  . Never Used    Comment: quit 2012     Counseling given: Yes Patient is former smoker with no plans to restart   Past Medical History:  Diagnosis Date  . Anemia   . Angina   . Angioedema    2/2 ACE  . Arteriovenous malformation of stomach   . Arthritis   . Asthma   . AVM (arteriovenous malformation) of colon    small intestine; stomach  . Blood transfusion   . CHF (congestive heart failure) ([place])   . Complete heart block ([place])    s/p PPM 1998  . Complication of anesthesia    Difficult airway; anaphylaxis and swelling with propofol  . DDD (degenerative disc disease)   . Depression   . Diastolic heart failure   . Fatty liver 07/26/10  . GERD (gastroesophageal reflux disease)   . GI bleed   . Headache   . History of alcohol abuse Stopped Fall 2012  . History of tobacco use Quit Fall 2012  . Hx of cardiovascular stress test    a. Lexiscan Myoview (10/15):  Small inferolateral and apical defect c/w scar and poss soft tissue attenuation, no ischemia, EF 42%  . Hx of colonic polyp 08/13/10   adenomatous  . Hx of colonoscopy   . Hyperlipidemia   . Hyperlipidemia   . Hypertension   . Hypertrophic cardiomyopathy ([place])    dx by Dr [name] 2009  . Iron deficiency anemia   . Panic attack   . Panic attacks   .  Permanent atrial fibrillation ([place])   . Pneumonia   . Renal failure    baseline creatinine 1.6  . Right arm pain 01/08/2012  . RLS (restless legs syndrome)    Dx 06/2007  . Shortness of breath    sob on exertation  . Sleep apnea    Past Surgical History:  Procedure Laterality Date  . CARDIAC CATHETERIZATION    . CARDIAC CATHETERIZATION N/A 08/19/2016   Procedure: Right/Left Heart Cath and Coronary Angiography;  Surgeon: Jolaine Artist, MD;  Location: [facility];  Service: Cardiovascular;  Laterality: N/A;  . DIRECT LARYNGOSCOPY N/A 09/18/2016   Procedure: DIRECT LARYNGOSCOPY, BRONCHOSCOPY, REMOVAL OF INTUBATION GRANULOMA;  Surgeon: [name], MD;  Location: [facility] OR;  Service: ENT;  Laterality: N/A;  . DIRECT LARYNGOSCOPY N/A 10/20/2016   Procedure: EXTUBATION AND FLEXIBLE LARYNGOSCOPE;  Surgeon: [name], MD;  Location: [facility];  Service: ENT;  Laterality: N/A;  . DIRECT LARYNGOSCOPY N/A 10/29/2016   Procedure: DIRECT LARYNGOSCOPY;  Surgeon: [name], MD;  Location: [facility] OR;  Service: ENT;  Laterality: N/A;  . ESOPHAGOGASTRODUODENOSCOPY  12/23/2011   Procedure: ESOPHAGOGASTRODUODENOSCOPY (EGD);  Surgeon: [name], MD;  Location: [facility];  Service: Endoscopy;  Laterality: N/A;  . EXTUBATION (ENDOTRACHEAL)  IN OR N/A 07/21/2016   Procedure: EXTUBATION (ENDOTRACHEAL) IN OR;  Surgeon: [name], MD;  Location: [facility];  Service: ENT;  Laterality: N/A;  . GIVENS CAPSULE STUDY  12/23/2011   Procedure: GIVENS CAPSULE STUDY;  Surgeon: [name], MD;  [facility];  Service: Endoscopy;  Laterality: N/A;  . MICROLARYNGOSCOPY WITH LASER N/A 10/16/2016   Procedure: MICRODIRECTLARYNGOSCOPY WITH LASER ABLATION AND KENLOG INJECTION;  Surgeon: [name], MD;  Location: [facility];  Service: ENT;  Laterality: N/A;  . PACEMAKER INSERTION  1998   st jude, most recent gen change by [name] 4/12  . TRACHEOSTOMY TUBE PLACEMENT N/A 10/29/2016   Procedure: TRACHEOSTOMY;  Surgeon:  [name], MD;  Location: [facility];  Service: ENT;  Laterality: N/A;  . TUBAL LIGATION  04/01/2000   Family History  Problem Relation Age of Onset  . Hypertension Mother   . Stroke Mother   . Heart disease Mother   . Aneurysm Mother   . CVA Mother   . Hypertension Father   . Stroke Father   . Heart attack Father   . Alcohol abuse Father   . Hypertension Sister   . Hypertension Brother   . Colon cancer Brother   . Cancer Brother   . Diabetes Sister   . Diabetes Brother   . Heart attack Brother   . Heart attack Brother   . Heart disease Sister   . Alcohol abuse Brother   . Heart disease Daughter    History  Sexual Activity  . Sexual activity: No    Outpatient Encounter Prescriptions as of 04/16/2017  Medication Sig  . acetaminophen (TYLENOL) 325 MG tablet Take 325 mg by mouth daily as needed (pain).  [name] albuterol (PROVENTIL) (2.5 MG/3ML) 0.083% nebulizer solution Take 3 mLs (2.5 mg total) by nebulization every 6 (six) hours as needed for wheezing or shortness of breath.  . ALPRAZolam (XANAX) 0.5 MG tablet Take 1 tablet (0.5 mg total) by mouth at bedtime as needed for anxiety.  [name] aspirin EC 81 MG tablet Take 81 mg by mouth every morning.   . bisacodyl (DULCOLAX) 10 MG suppository Place 10 mg rectally daily as needed for mild constipation.   . feeding supplement, ENSURE ENLIVE, (ENSURE ENLIVE) LIQD Take 237 mLs by mouth 2 (two) times daily between meals.  . ferrous sulfate 325 (65 FE) MG tablet TAKE 1 TABLET BY MOUTH TWICE DAILY WITH MEALS TO KEEP BLOOD COUNT UP (Patient taking differently: Take 325 mg by mouth 2 (two) times daily with a meal. TO KEEP BLOOD COUNT UP)  . furosemide (LASIX) 40 MG tablet Take 1 tablet (40 mg total) by mouth daily. Take one tablet daily  . metoprolol tartrate (LOPRESSOR) 25 MG tablet TAKE 1 TABLET BY MOUTH EVERY 6 HOURS AS NEEDED FOR HEART FLUTTERING  . nitroGLYCERIN (NITROSTAT) 0.4 MG SL tablet Place 1 tablet (0.4 mg total) under the tongue every 5  (five) minutes as needed for chest pain.  [name] omeprazole (PRILOSEC) 20 MG capsule Take 1 capsule (20 mg total) by mouth daily.  . polyethylene glycol (MIRALAX / GLYCOLAX) packet Take 17 g by mouth daily.  . potassium chloride (K-DUR) 10 MEQ tablet Take 40 mEq by mouth daily.  . rosuvastatin (CRESTOR) 20 MG tablet Take 1 tablet (20 mg total) by mouth daily.  [name] senna (SENOKOT) 8.6 MG TABS tablet Take 1 tablet (8.6 mg total) by mouth at bedtime.  [name] CALCIUM PO Take 1 tablet by mouth once a week.   [name]  trolamine salicylate (ASPERCREME) 10 % cream Apply 1 application topically 2 (two) times daily as needed for muscle pain.    No facility-administered encounter medications on file as of 04/16/2017.     Activities of Daily Living In your present state of health, do you have any difficulty performing the following activities: 04/16/2017 12/25/2016  Hearing? Y N  Vision? Y Y  Difficulty concentrating or making decisions? Tempie Donning  Walking or climbing stairs? Y Y  Dressing or bathing? N N  Doing errands, shopping? N Y  Conservation officer, nature and eating ? N Y  Using the Toilet? N N  In the past six months, have you accidently leaked urine? Y N  Do you have problems with loss of bowel control? N N  Managing your Medications? Y N  Managing your Finances? Y N  Housekeeping or managing your Housekeeping? N Y  Some recent data might be hidden   Home Safety:  My home has a working smoke alarm:  Yes X 3           My home throw rugs have been fastened down to the floor or removed:  Non-slip backs or taped down I have non-slip mats in the bathtub and shower:  Yes and shower chair         All my home's stairs have railings or bannisters: One level home with 6 outside steps with handrails           My home's floors, stairs and hallways are free from clutter, wires and cords:  Yes     I wear seatbelts consistently:  Yes   Patient Care Team: Steve Rattler, DO as PCP - General Thompson Grayer, MD (Cardiology) Mauri Pole, MD as Consulting Physician (Gastroenterology)    Assessment:     Exercise Activities and Dietary recommendations Current Exercise Habits: Home exercise routine, Type of exercise: stretching;walking, Time (Minutes): 20, Frequency (Times/Week): 2, Weekly Exercise (Minutes/Week): 40, Intensity: Mild, Exercise limited by: respiratory conditions(s);cardiac condition(s) (Fatigue/ Pt has trach)  Goals    . Eat more fruits and vegetables    . Exercise 3x per week (30 min per time)    . Increase water intake      Fall Risk Fall Risk  04/16/2017 03/19/2017 03/19/2017 02/09/2017 01/26/2017  Falls in the past year? Yes No Yes Yes Yes  Number falls in past yr: 1 - 1 1 1   Injury with Fall? No - No No No  Risk for fall due to : - - - - -  Follow up Falls evaluation completed;Education provided;Falls prevention discussed - Falls prevention discussed - -   TUG Test:  Done in 12 seconds.   Cognitive Function: Mini-Cog  Passed with score 3/5   Depression Screen PHQ 2/9 Scores 04/16/2017 03/19/2017 03/19/2017 02/09/2017  PHQ - 2 Score 0 0 0 1     Cognitive Function MMSE - Mini Mental State Exam 04/04/2014  Orientation to time 5  Orientation to Place 5  Registration 3  Attention/ Calculation 0  Recall 0  Language- name 2 objects 2  Language- repeat 1  Language- follow 3 step command 3  Language- read & follow direction 1  Write a sentence 1  Copy design 1  Total score 22        Immunization History  Administered Date(s) Administered  . Influenza Split 01/08/2012  . Influenza,inj,Quad PF,36+ Mos 11/26/2015, 10/19/2016  . PPD Test 07/26/2016  . Pneumococcal Conjugate-13 04/27/2014  . Pneumococcal Polysaccharide-23 12/23/2010  .  Td 03/15/2004   Screening Tests Health Maintenance  Topic Date Due  . FOOT EXAM  01/10/1956  . OPHTHALMOLOGY EXAM  01/10/1956  . URINE MICROALBUMIN  01/10/1956  . LIPID PANEL  03/27/2016  . TETANUS/TDAP  08/11/2017 (Originally 03/15/2014)  . INFLUENZA  VACCINE  07/15/2017  . HEMOGLOBIN A1C  09/18/2017  . MAMMOGRAM  04/11/2018  . COLONOSCOPY  08/24/2021  . DEXA SCAN  Completed  . Hepatitis C Screening  Completed  . PNA vac Low Risk Adult  Completed  Patient will obtain TDaP at local pharmacy Hgb A1c 5.8 on 03/19/2017 Patient is prediabetic and not on meds for T2DM so DM HM  not performed today.     Plan:      I have personally reviewed and noted the following in the patient's chart:   . Medical and social history . Use of alcohol, tobacco or illicit drugs  . Current medications and supplements . Functional ability and status . Nutritional status . Physical activity . Advanced directives . List of other physicians . Vitals . Screenings to include cognitive, depression, and falls . Referrals and appointments  In addition, I have reviewed and discussed with patient certain preventive protocols, quality metrics, and best practice recommendations. A written personalized care plan for preventive services as well as general preventive health recommendations were provided to patient.     Velora Heckler, RN  04/16/2017

## 2017-04-16 NOTE — Patient Instructions (Signed)
Fall Prevention in the Home Falls can cause injuries. They can happen to people of all ages. There are many things you can do to make your home safe and to help prevent falls. What can I do on the outside of my home?  Regularly fix the edges of walkways and driveways and fix any cracks.  Remove anything that might make you trip as you walk through a door, such as a raised step or threshold.  Trim any bushes or trees on the path to your home.  Use bright outdoor lighting.  Clear any walking paths of anything that might make someone trip, such as rocks or tools.  Regularly check to see if handrails are loose or broken. Make sure that both sides of any steps have handrails.  Any raised decks and porches should have guardrails on the edges.  Have any leaves, snow, or ice cleared regularly.  Use sand or salt on walking paths during winter.  Clean up any spills in your garage right away. This includes oil or grease spills. What can I do in the bathroom?  Use night lights.  Install grab bars by the toilet and in the tub and shower. Do not use towel bars as grab bars.  Use non-skid mats or decals in the tub or shower.  If you need to sit down in the shower, use a plastic, non-slip stool.  Keep the floor dry. Clean up any water that spills on the floor as soon as it happens.  Remove soap buildup in the tub or shower regularly.  Attach bath mats securely with double-sided non-slip rug tape.  Do not have throw rugs and other things on the floor that can make you trip. What can I do in the bedroom?  Use night lights.  Make sure that you have a light by your bed that is easy to reach.  Do not use any sheets or blankets that are too big for your bed. They should not hang down onto the floor.  Have a firm chair that has side arms. You can use this for support while you get dressed.  Do not have throw rugs and other things on the floor that can make you trip. What can I do in  the kitchen?  Clean up any spills right away.  Avoid walking on wet floors.  Keep items that you use a lot in easy-to-reach places.  If you need to reach something above you, use a strong step stool that has a grab bar.  Keep electrical cords out of the way.  Do not use floor polish or wax that makes floors slippery. If you must use wax, use non-skid floor wax.  Do not have throw rugs and other things on the floor that can make you trip. What can I do with my stairs?  Do not leave any items on the stairs.  Make sure that there are handrails on both sides of the stairs and use them. Fix handrails that are broken or loose. Make sure that handrails are as long as the stairways.  Check any carpeting to make sure that it is firmly attached to the stairs. Fix any carpet that is loose or worn.  Avoid having throw rugs at the top or bottom of the stairs. If you do have throw rugs, attach them to the floor with carpet tape.  Make sure that you have a light switch at the top of the stairs and the bottom of the stairs. If you  do not have them, ask someone to add them for you. What else can I do to help prevent falls?  Wear shoes that:  Do not have high heels.  Have rubber bottoms.  Are comfortable and fit you well.  Are closed at the toe. Do not wear sandals.  If you use a stepladder:  Make sure that it is fully opened. Do not climb a closed stepladder.  Make sure that both sides of the stepladder are locked into place.  Ask someone to hold it for you, if possible.  Clearly mark and make sure that you can see:  Any grab bars or handrails.  First and last steps.  Where the edge of each step is.  Use tools that help you move around (mobility aids) if they are needed. These include:  Canes.  Walkers.  Scooters.  Crutches.  Turn on the lights when you go into a dark area. Replace any light bulbs as soon as they burn out.  Set up your furniture so you have a clear  path. Avoid moving your furniture around.  If any of your floors are uneven, fix them.  If there are any pets around you, be aware of where they are.  Review your medicines with your doctor. Some medicines can make you feel dizzy. This can increase your chance of falling. Ask your doctor what other things that you can do to help prevent falls. This information is not intended to replace advice given to you by your health care provider. Make sure you discuss any questions you have with your health care provider. Document Released: 09/27/2009 Document Revised: 05/08/2016 Document Reviewed: 01/05/2015 Elsevier Interactive Patient Education  2017 [place] Maintenance, Female Adopting a healthy lifestyle and getting preventive care can go a long way to promote health and wellness. Talk with your health care provider about what schedule of regular examinations is right for you. This is a good chance for you to check in with your provider about disease prevention and staying healthy. In between checkups, there are plenty of things you can do on your own. Experts have done a lot of research about which lifestyle changes and preventive measures are most likely to keep you healthy. Ask your health care provider for more information. Weight and diet Eat a healthy diet  Be sure to include plenty of vegetables, fruits, low-fat dairy products, and lean protein.  Do not eat a lot of foods high in solid fats, added sugars, or salt.  Get regular exercise. This is one of the most important things you can do for your health.  Most adults should exercise for at least 150 minutes each week. The exercise should increase your heart rate and make you sweat (moderate-intensity exercise).  Most adults should also do strengthening exercises at least twice a week. This is in addition to the moderate-intensity exercise. Maintain a healthy weight  Body mass index (BMI) is a measurement that can be used  to identify possible weight problems. It estimates body fat based on height and weight. Your health care provider can help determine your BMI and help you achieve or maintain a healthy weight.  For females 45 years of age and older:  A BMI below 18.5 is considered underweight.  A BMI of 18.5 to 24.9 is normal.  A BMI of 25 to 29.9 is considered overweight.  A BMI of 30 and above is considered obese. Watch levels of cholesterol and blood lipids  You should start having  your blood tested for lipids and cholesterol at 73 years of age, then have this test every 5 years.  You may need to have your cholesterol levels checked more often if:  Your lipid or cholesterol levels are high.  You are older than 73 years of age.  You are at high risk for heart disease. Cancer screening Lung Cancer  Lung cancer screening is recommended for adults 55-80 years old who are at high risk for lung cancer because of a history of smoking.  A yearly low-dose CT scan of the lungs is recommended for people who:  Currently smoke.  Have quit within the past 15 years.  Have at least a 30-pack-year history of smoking. A pack year is smoking an average of one pack of cigarettes a day for 1 year.  Yearly screening should continue until it has been 15 years since you quit.  Yearly screening should stop if you develop a health problem that would prevent you from having lung cancer treatment. Breast Cancer  Practice breast self-awareness. This means understanding how your breasts normally appear and feel.  It also means doing regular breast self-exams. Let your health care provider know about any changes, no matter how small.  If you are in your 20s or 30s, you should have a clinical breast exam (CBE) by a health care provider every 1-3 years as part of a regular health exam.  If you are 40 or older, have a CBE every year. Also consider having a breast X-ray (mammogram) every year.  If you have a family  history of breast cancer, talk to your health care provider about genetic screening.  If you are at high risk for breast cancer, talk to your health care provider about having an MRI and a mammogram every year.  Breast cancer gene (BRCA) assessment is recommended for women who have family members with BRCA-related cancers. BRCA-related cancers include:  Breast.  Ovarian.  Tubal.  Peritoneal cancers.  Results of the assessment will determine the need for genetic counseling and BRCA1 and BRCA2 testing. Cervical Cancer  Your health care provider may recommend that you be screened regularly for cancer of the pelvic organs (ovaries, uterus, and vagina). This screening involves a pelvic examination, including checking for microscopic changes to the surface of your cervix (Pap test). You may be encouraged to have this screening done every 3 years, beginning at age 21.  For women ages 30-65, health care providers may recommend pelvic exams and Pap testing every 3 years, or they may recommend the Pap and pelvic exam, combined with testing for human papilloma virus (HPV), every 5 years. Some types of HPV increase your risk of cervical cancer. Testing for HPV may also be done on women of any age with unclear Pap test results.  Other health care providers may not recommend any screening for nonpregnant women who are considered low risk for pelvic cancer and who do not have symptoms. Ask your health care provider if a screening pelvic exam is right for you.  If you have had past treatment for cervical cancer or a condition that could lead to cancer, you need Pap tests and screening for cancer for at least 20 years after your treatment. If Pap tests have been discontinued, your risk factors (such as having a new sexual partner) need to be reassessed to determine if screening should resume. Some women have medical problems that increase the chance of getting cervical cancer. In these cases, your health care  provider may   recommend more frequent screening and Pap tests. Colorectal Cancer  This type of cancer can be detected and often prevented.  Routine colorectal cancer screening usually begins at 73 years of age and continues through 73 years of age.  Your health care provider may recommend screening at an earlier age if you have risk factors for colon cancer.  Your health care provider may also recommend using home test kits to check for hidden blood in the stool.  A small camera at the end of a tube can be used to examine your colon directly (sigmoidoscopy or colonoscopy). This is done to check for the earliest forms of colorectal cancer.  Routine screening usually begins at age 50.  Direct examination of the colon should be repeated every 5-10 years through 73 years of age. However, you may need to be screened more often if early forms of precancerous polyps or small growths are found. Skin Cancer  Check your skin from head to toe regularly.  Tell your health care provider about any new moles or changes in moles, especially if there is a change in a mole's shape or color.  Also tell your health care provider if you have a mole that is larger than the size of a pencil eraser.  Always use sunscreen. Apply sunscreen liberally and repeatedly throughout the day.  Protect yourself by wearing long sleeves, pants, a wide-brimmed hat, and sunglasses whenever you are outside. Heart disease, diabetes, and high blood pressure  High blood pressure causes heart disease and increases the risk of stroke. High blood pressure is more likely to develop in:  People who have blood pressure in the high end of the normal range (130-139/85-89 mm Hg).  People who are overweight or obese.  People who are African American.  If you are 18-39 years of age, have your blood pressure checked every 3-5 years. If you are 40 years of age or older, have your blood pressure checked every year. You should have your  blood pressure measured twice-once when you are at a hospital or clinic, and once when you are not at a hospital or clinic. Record the average of the two measurements. To check your blood pressure when you are not at a hospital or clinic, you can use:  An automated blood pressure machine at a pharmacy.  A home blood pressure monitor.  If you are between 55 years and 79 years old, ask your health care provider if you should take aspirin to prevent strokes.  Have regular diabetes screenings. This involves taking a blood sample to check your fasting blood sugar level.  If you are at a normal weight and have a low risk for diabetes, have this test once every three years after 73 years of age.  If you are overweight and have a high risk for diabetes, consider being tested at a younger age or more often. Preventing infection Hepatitis B  If you have a higher risk for hepatitis B, you should be screened for this virus. You are considered at high risk for hepatitis B if:  You were born in a country where hepatitis B is common. Ask your health care provider which countries are considered high risk.  Your parents were born in a high-risk country, and you have not been immunized against hepatitis B (hepatitis B vaccine).  You have HIV or AIDS.  You use needles to inject street drugs.  You live with someone who has hepatitis B.  You have had sex with someone who   has hepatitis B.  You get hemodialysis treatment.  You take certain medicines for conditions, including cancer, organ transplantation, and autoimmune conditions. Hepatitis C  Blood testing is recommended for:  Everyone born from [birth year] through 1967.  Anyone with known risk factors for hepatitis C. Sexually transmitted infections (STIs)  You should be screened for sexually transmitted infections (STIs) including gonorrhea and chlamydia if:  You are sexually active and are younger than 73 years of age.  You are older than 73  years of age and your health care provider tells you that you are at risk for this type of infection.  Your sexual activity has changed since you were last screened and you are at an increased risk for chlamydia or gonorrhea. Ask your health care provider if you are at risk.  If you do not have HIV, but are at risk, it may be recommended that you take a prescription medicine daily to prevent HIV infection. This is called pre-exposure prophylaxis (PrEP). You are considered at risk if:  You are sexually active and do not regularly use condoms or know the HIV status of your partner(s).  You take drugs by injection.  You are sexually active with a partner who has HIV. Talk with your health care provider about whether you are at high risk of being infected with HIV. If you choose to begin PrEP, you should first be tested for HIV. You should then be tested every 3 months for as long as you are taking PrEP. Pregnancy  If you are premenopausal and you may become pregnant, ask your health care provider about preconception counseling.  If you may become pregnant, take 400 to 800 micrograms (mcg) of folic acid every day.  If you want to prevent pregnancy, talk to your health care provider about birth control (contraception). Osteoporosis and menopause  Osteoporosis is a disease in which the bones lose minerals and strength with aging. This can result in serious bone fractures. Your risk for osteoporosis can be identified using a bone density scan.  If you are 64 years of age or older, or if you are at risk for osteoporosis and fractures, ask your health care provider if you should be screened.  Ask your health care provider whether you should take a calcium or vitamin D supplement to lower your risk for osteoporosis.  Menopause may have certain physical symptoms and risks.  Hormone replacement therapy may reduce some of these symptoms and risks. Talk to your health care provider about whether  hormone replacement therapy is right for you. Follow these instructions at home:  Schedule regular health, dental, and eye exams.  Stay current with your immunizations.  Do not use any tobacco products including cigarettes, chewing tobacco, or electronic cigarettes.  If you are pregnant, do not drink alcohol.  If you are breastfeeding, limit how much and how often you drink alcohol.  Limit alcohol intake to no more than 1 drink per day for nonpregnant women. One drink equals 12 ounces of beer, 5 ounces of wine, or 1 ounces of hard liquor.  Do not use street drugs.  Do not share needles.  Ask your health care provider for help if you need support or information about quitting drugs.  Tell your health care provider if you often feel depressed.  Tell your health care provider if you have ever been abused or do not feel safe at home. This information is not intended to replace advice given to you by your health care provider. Make  sure you discuss any questions you have with your health care provider. Document Released: 06/16/2011 Document Revised: 05/08/2016 Document Reviewed: 09/04/2015 Elsevier Interactive Patient Education  2017 [employer]  

## 2017-04-18 DIAGNOSIS — G4730 Sleep apnea, unspecified: Secondary | ICD-10-CM | POA: Diagnosis not present

## 2017-04-18 NOTE — Procedures (Signed)
  Patient Name: Vi, Biddinger Date: 04/12/2017 Gender: Female D.O.B: [birth year] Age (years): 81 Referring Provider: [name] Height (inches): 29 Interpreting Physician: Baird Lyons MD, ABSM Weight (lbs): 143 RPSGT: Madelon Lips BMI: 24 MRN: [id number] Neck Size: 13.50 CLINICAL INFORMATION Sleep Study Type: NPSG  Indication for sleep study: OSA  Epworth Sleepiness Score: 4  SLEEP STUDY TECHNIQUE As per the AASM Manual for the Scoring of Sleep and Associated Events v2.3 (April 2016) with a hypopnea requiring 4% desaturations.  The channels recorded and monitored were frontal, central and occipital EEG, electrooculogram (EOG), submentalis EMG (chin), nasal and oral airflow, thoracic and abdominal wall motion, anterior tibialis EMG, snore microphone, electrocardiogram, and pulse oximetry.  MEDICATIONS Medications self-administered by patient taken the night of the study : [place] The study was initiated at 10:12:13 PM and ended at 4:24:16 AM.  Sleep onset time was 54.4 minutes and the sleep efficiency was 13.2%. The total sleep time was 49.0 minutes.  Stage REM latency was 313.0 minutes.  The patient spent 35.71% of the night in stage N1 sleep, 55.10% in stage N2 sleep, 0.00% in stage N3 and 9.18% in REM.  Alpha intrusion was absent.  Supine sleep was 5.10%.  RESPIRATORY PARAMETERS The overall apnea/hypopnea index (AHI) was 15.9 per hour. There were 7 total apneas, including 4 obstructive, 3 central and 0 mixed apneas. There were 6 hypopneas and 3 RERAs.  The AHI during Stage REM sleep was 0.0 per hour.  AHI while supine was 24.0 per hour.  The mean oxygen saturation was 92.71%. The minimum SpO2 during sleep was 82.00%.  Moderate snoring was noted during this study.  Study performed with tracheostomy capped.  CARDIAC DATA The 2 lead EKG demonstrated sinus rhythm, pacemaker generated. The mean heart rate was 69.81  beats per minute. Other EKG findings include: None.  LEG MOVEMENT DATA The total PLMS were 10 with a resulting PLMS index of 12.24. Associated arousal with leg movement index was 0.0 .  IMPRESSIONS - Marked difficulty initiating and maintaining sleep. - Study performed with tracheostomy capped. - Moderate obstructive sleep apnea occurred during this study (AHI = 15.9/h). - Insufficient early sleep and events to meet protocol requirements for CPAP titration - No significant central sleep apnea occurred during this study (CAI = 3.7/h). - Mild oxygen desaturation was noted during this study (Min O2 = 82.00%, Mean 92.7%). - The patient snored with Moderate snoring volume. - No cardiac abnormalities were noted during this study. - Mild periodic limb movements of sleep occurred during the study. No significant associated arousals.  DIAGNOSIS - Obstructive Sleep Apnea (327.23 [G47.33 ICD-10]) - Insomnia ( G47.00)  RECOMMENDATIONS - Therapeutic CPAP titration to determine optimal pressure required to alleviate sleep disordered breathing. Other options based on clinical judgment. - Be careful with alcohol, sedatives and other CNS depressants that may worsen sleep apnea and disrupt normal sleep architecture. - Sleep hygiene should be reviewed to assess factors that may improve sleep quality. - Weight management and regular exercise should be initiated or continued if appropriate.  [Electronically signed] 04/18/2017 11:38 AM  Baird Lyons MD, ABSM Diplomate, American Board of Sleep Medicine   NPI: [id number]  [place], American Board of Sleep Medicine  ELECTRONICALLY SIGNED ON:  04/18/2017, 11:38 AM [place] PH: [phone]   FX: [phone] [place]

## 2017-04-20 ENCOUNTER — Ambulatory Visit (INDEPENDENT_AMBULATORY_CARE_PROVIDER_SITE_OTHER): Payer: Medicare Other | Admitting: Family Medicine

## 2017-04-20 ENCOUNTER — Encounter: Payer: Self-pay | Admitting: Family Medicine

## 2017-04-20 DIAGNOSIS — R634 Abnormal weight loss: Secondary | ICD-10-CM | POA: Diagnosis not present

## 2017-04-20 DIAGNOSIS — M6281 Muscle weakness (generalized): Secondary | ICD-10-CM | POA: Diagnosis not present

## 2017-04-20 DIAGNOSIS — N183 Chronic kidney disease, stage 3 unspecified: Secondary | ICD-10-CM

## 2017-04-20 DIAGNOSIS — R7303 Prediabetes: Secondary | ICD-10-CM

## 2017-04-20 DIAGNOSIS — J449 Chronic obstructive pulmonary disease, unspecified: Secondary | ICD-10-CM | POA: Diagnosis not present

## 2017-04-20 NOTE — Patient Instructions (Addendum)
Recommendations:  1. Eat at least 3 REAL meals and 1 snack per day.  Aim for no more than 5 hours between eating.  Eat breakfast within one hour of getting up.   - A REAL meal for lunch or dinner includes at least some protein, some starch, and    vegetables and/or fruit.    - OR: Would you serve this to a guest in your home, and call it a meal?  - Lunch or dinner can be a [place] sandwich with some fruit or apple sauce.     - Breakfast might be 1-2 eggs with grits OR 1-2 slices toast OR breakfast could be    just a bottle of Ensure.     2. Make sure you get some protein foods at each meal.    - Protein foods: Meat, fish, poultry, eggs, dairy foods, and beans such as       pinto and kidney beans (beans also provide carbohydrate).     If you are using beans as your main protein food, you need at least a full cup of    beans to get enough protein.   3. Continue to get at least 2 bottles of nutritional drink (Ensure) per day.  A good generic brand can be found at [facility].  (Also can get good prices on these drinks at [employer] or [employer].)  Ask your daughter to check prices and availability online.  Suggestions: - When you use canned soup, choose the lowest sodium you can find, and ALWAYS first heat some (fresh or frozen) vegetables, then add soup, and reheat.  The more vegetables and fruit you get, the more potassium you eat, which is good for your blood pressure.   - When you do cook, make a lot at one time, and freeze leftovers.   - Make use of frozen vegetables, which are both easy and good for you.  Janne Napoleon clear of sauces and seasonings, which are usually full of sodium.) - The frozen meals are usually VERY HIGH IN SODIUM.   - For now, while your appetite is not great, there will be times you will have to eat beyond comfort.   - Feel free to use olive oil liberally.  This is going to be the BEST fat source for you.   - Honey and sugar have about the same effect on your blood sugar, so you  want to minimize both. - Most fruit is fine to eat in normal portion sizes, but fruit juice is NOT good for your blood sugar!  All sweet drinks will drive up blood sugar levels.    - Follow up with an appt with Dr. [name] in early July. - Your follow-up appt with Dr. Jenne Campus for nutrition is on June 12 at 1:30 PM.  If you need to change appt time for nutrition, or have a question, call Dr. Jenne Campus: [phone].

## 2017-04-20 NOTE — Progress Notes (Signed)
Medical Nutrition Therapy:  Appt start time: 1430 end time:  7035. Gastroenterologist: Leta Jungling, MD PCP: Lucila Maine, DO  NOTE: Poor reading level; daughter helps her with this.   Assessment:  Primary concerns today: CKD dietary management, BG control, and unintentional weight loss (E11.8; R63.4). Ms. Puello has lost 11 lb since January.  Appetite is very poor, so she is using Ensure drinks as meal replacements.  Sounds like she cooks about once a week, uses canned soup about once weekly, and has sandwiches for dinner several nights.  Her daughter also cooks sometimes.    When asked if she has a glucometer at home, she said, "I don't think so."  Dr. [name] had recommended increasing protein sources, but she is not really sure what foods these are.  Reviewed these today.  Also reviewed a few basic principles of diet for BG control (limit sweet drinks; eat vs. drink fruit; eat at least every 5 hrs; balance macro's at meals), but I told her that I'd prefer she not limit carb's too much right now in light of recent weight loss as well as decreased A1c (5.8).    Learning Readiness: Ready  Usual eating pattern includes 1 meal and 2-3 snacks per day. Frequent foods and beverages include fruit, cucumbers, tomatoes, chx, chx noodle soup, water, lemonade or orange juice. Usual physical activity includes none since hospitalization in April.  24-hr recall: (Up at 9 AM) B (9:30 AM)-   8 oz Ensure Snk ( AM)-   8 oz Ensure L (1 PM)-  1 rotisserie chx leg,  1/2 c crm'd spinach, water Snk ( PM)-  1 small cream-filled donut, water D (9:30 PM)-  1-2 oz rotisserie chx breast, water Snk ( PM)-  --- Typical day? Yes.    Progress Towards Goal(s):  In progress.   Nutritional Diagnosis:  NB-1.1 Food and nutrition-related knowledge deficit As related to fundamental nutrition.  As evidenced by patient's confusion as to which foods provide protein.    Intervention:  Nutrition  education  Handouts given during visit include:  AVS, large print  Demonstrated degree of understanding via:  Teach Back  Barriers to learning/adherence to lifestyle change: Poor appetite  Monitoring/Evaluation:  Dietary intake, exercise, and body weight in 5 week(s).

## 2017-04-21 LAB — CUP PACEART INCLINIC DEVICE CHECK
Date Time Interrogation Session: 20180629090741
Implantable Lead Implant Date: 19860725
Implantable Lead Serial Number: 652511
Implantable Pulse Generator Implant Date: 20120424
MDC IDC LEAD IMPLANT DT: 19860725
MDC IDC LEAD LOCATION: 753859
MDC IDC LEAD LOCATION: 753860
MDC IDC PG SERIAL: 2406848

## 2017-04-24 DIAGNOSIS — Z930 Tracheostomy status: Secondary | ICD-10-CM | POA: Diagnosis not present

## 2017-04-24 DIAGNOSIS — J449 Chronic obstructive pulmonary disease, unspecified: Secondary | ICD-10-CM | POA: Diagnosis not present

## 2017-04-24 DIAGNOSIS — D649 Anemia, unspecified: Secondary | ICD-10-CM | POA: Diagnosis not present

## 2017-04-24 DIAGNOSIS — J398 Other specified diseases of upper respiratory tract: Secondary | ICD-10-CM | POA: Diagnosis not present

## 2017-04-24 DIAGNOSIS — I4891 Unspecified atrial fibrillation: Secondary | ICD-10-CM | POA: Diagnosis not present

## 2017-04-28 ENCOUNTER — Telehealth: Payer: Self-pay | Admitting: Gastroenterology

## 2017-04-29 NOTE — Telephone Encounter (Signed)
[name],  The pt states you are working on an appt for her, she has an appt tomorrow but you can leave a message.

## 2017-04-30 ENCOUNTER — Ambulatory Visit: Payer: Medicare Other | Admitting: Family Medicine

## 2017-04-30 DIAGNOSIS — G4733 Obstructive sleep apnea (adult) (pediatric): Secondary | ICD-10-CM | POA: Diagnosis not present

## 2017-04-30 DIAGNOSIS — Z430 Encounter for attention to tracheostomy: Secondary | ICD-10-CM | POA: Diagnosis not present

## 2017-04-30 DIAGNOSIS — J386 Stenosis of larynx: Secondary | ICD-10-CM | POA: Diagnosis not present

## 2017-04-30 NOTE — Telephone Encounter (Signed)
Did you already have a date in mind? If not, I will take it from here. I see July 3 as the first available at [facility]

## 2017-05-01 ENCOUNTER — Other Ambulatory Visit: Payer: Self-pay

## 2017-05-01 DIAGNOSIS — Z1211 Encounter for screening for malignant neoplasm of colon: Secondary | ICD-10-CM

## 2017-05-01 DIAGNOSIS — I864 Gastric varices: Secondary | ICD-10-CM

## 2017-05-01 NOTE — Telephone Encounter (Signed)
Called back to the patient. No answer. Left a message. Dates are pre-visit 05/29/17 and 06/16/17. Letter mailed with this information.

## 2017-05-01 NOTE — Telephone Encounter (Signed)
[name], Im at Neuro this morning   July is fine, Thanks

## 2017-05-04 DIAGNOSIS — J449 Chronic obstructive pulmonary disease, unspecified: Secondary | ICD-10-CM | POA: Diagnosis not present

## 2017-05-04 DIAGNOSIS — Z930 Tracheostomy status: Secondary | ICD-10-CM | POA: Diagnosis not present

## 2017-05-14 ENCOUNTER — Emergency Department (HOSPITAL_COMMUNITY): Payer: Medicare Other

## 2017-05-14 ENCOUNTER — Encounter (HOSPITAL_COMMUNITY): Payer: Self-pay

## 2017-05-14 ENCOUNTER — Emergency Department (HOSPITAL_COMMUNITY)
Admission: EM | Admit: 2017-05-14 | Discharge: 2017-05-14 | Disposition: A | Discharge status: Home / Self Care | Payer: Medicare Other | Attending: Emergency Medicine | Admitting: Emergency Medicine

## 2017-05-14 DIAGNOSIS — I130 Hypertensive heart and chronic kidney disease with heart failure and stage 1 through stage 4 chronic kidney disease, or unspecified chronic kidney disease: Secondary | ICD-10-CM | POA: Insufficient documentation

## 2017-05-14 DIAGNOSIS — Z79899 Other long term (current) drug therapy: Secondary | ICD-10-CM | POA: Insufficient documentation

## 2017-05-14 DIAGNOSIS — Z7982 Long term (current) use of aspirin: Secondary | ICD-10-CM | POA: Insufficient documentation

## 2017-05-14 DIAGNOSIS — I509 Heart failure, unspecified: Secondary | ICD-10-CM | POA: Diagnosis not present

## 2017-05-14 DIAGNOSIS — R0602 Shortness of breath: Secondary | ICD-10-CM | POA: Diagnosis present

## 2017-05-14 DIAGNOSIS — N183 Chronic kidney disease, stage 3 (moderate): Secondary | ICD-10-CM | POA: Diagnosis not present

## 2017-05-14 DIAGNOSIS — M791 Myalgia, unspecified site: Secondary | ICD-10-CM

## 2017-05-14 DIAGNOSIS — J441 Chronic obstructive pulmonary disease with (acute) exacerbation: Secondary | ICD-10-CM | POA: Insufficient documentation

## 2017-05-14 DIAGNOSIS — Z87891 Personal history of nicotine dependence: Secondary | ICD-10-CM | POA: Insufficient documentation

## 2017-05-14 DIAGNOSIS — R05 Cough: Secondary | ICD-10-CM | POA: Diagnosis not present

## 2017-05-14 LAB — COMPREHENSIVE METABOLIC PANEL
ALBUMIN: 3.8 g/dL (ref 3.5–5.0)
ALT: 9 U/L — AB (ref 14–54)
AST: 23 U/L (ref 15–41)
Alkaline Phosphatase: 55 U/L (ref 38–126)
Anion gap: 12 (ref 5–15)
BUN: 46 mg/dL — AB (ref 6–20)
CHLORIDE: 103 mmol/L (ref 101–111)
CO2: 23 mmol/L (ref 22–32)
CREATININE: 2.02 mg/dL — AB (ref 0.44–1.00)
Calcium: 10.5 mg/dL — ABNORMAL HIGH (ref 8.9–10.3)
GFR calc Af Amer: 27 mL/min — ABNORMAL LOW (ref >60)
GFR calc non Af Amer: 23 mL/min — ABNORMAL LOW (ref >60)
GLUCOSE: 108 mg/dL — AB (ref 65–99)
POTASSIUM: 4.3 mmol/L (ref 3.5–5.1)
Sodium: 138 mmol/L (ref 135–145)
Total Bilirubin: 0.3 mg/dL (ref 0.3–1.2)
Total Protein: 7.8 g/dL (ref 6.5–8.1)

## 2017-05-14 LAB — CBC WITH DIFFERENTIAL/PLATELET
Basophils Absolute: 0 10*3/uL (ref 0.0–0.1)
Basophils Relative: 0 %
EOS PCT: 2 %
Eosinophils Absolute: 0.1 10*3/uL (ref 0.0–0.7)
HCT: 31.3 % — ABNORMAL LOW (ref 36.0–46.0)
Hemoglobin: 9.7 g/dL — ABNORMAL LOW (ref 12.0–15.0)
LYMPHS ABS: 1.9 10*3/uL (ref 0.7–4.0)
LYMPHS PCT: 35 %
MCH: 25.5 pg — AB (ref 26.0–34.0)
MCHC: 31 g/dL (ref 30.0–36.0)
MCV: 82.2 fL (ref 78.0–100.0)
MONO ABS: 0.5 10*3/uL (ref 0.1–1.0)
MONOS PCT: 8 %
Neutro Abs: 3.1 10*3/uL (ref 1.7–7.7)
Neutrophils Relative %: 55 %
Platelets: 307 10*3/uL (ref 150–400)
RBC: 3.81 MIL/uL — AB (ref 3.87–5.11)
RDW: 14.7 % (ref 11.5–15.5)
WBC: 5.6 10*3/uL (ref 4.0–10.5)

## 2017-05-14 LAB — I-STAT CG4 LACTIC ACID, ED
LACTIC ACID, VENOUS: 1.81 mmol/L (ref 0.5–1.9)
Lactic Acid, Venous: 1.4 mmol/L (ref 0.5–1.9)

## 2017-05-14 LAB — I-STAT TROPONIN, ED
TROPONIN I, POC: 0.14 ng/mL — AB (ref 0.00–0.08)
Troponin i, poc: 0.14 ng/mL (ref 0.00–0.08)
Troponin i, poc: 0.18 ng/mL (ref 0.00–0.08)

## 2017-05-14 LAB — CK: CK TOTAL: 61 U/L (ref 38–234)

## 2017-05-14 MED ORDER — PREDNISONE 20 MG PO TABS
60.0000 mg | ORAL_TABLET | Freq: Once | ORAL | Status: AC
  Administered 2017-05-14: 60 mg via ORAL
  Filled 2017-05-14: qty 3

## 2017-05-14 MED ORDER — ALBUTEROL SULFATE (2.5 MG/3ML) 0.083% IN NEBU
INHALATION_SOLUTION | RESPIRATORY_TRACT | Status: AC
  Filled 2017-05-14: qty 6

## 2017-05-14 MED ORDER — ALBUTEROL SULFATE (2.5 MG/3ML) 0.083% IN NEBU
5.0000 mg | INHALATION_SOLUTION | Freq: Once | RESPIRATORY_TRACT | Status: AC
  Administered 2017-05-14: 5 mg via RESPIRATORY_TRACT

## 2017-05-14 MED ORDER — PREDNISONE 20 MG PO TABS: ORAL_TABLET | ORAL | 0 refills | Status: DC

## 2017-05-14 MED ORDER — SODIUM CHLORIDE 0.9 % IV BOLUS (SEPSIS)
500.0000 mL | Freq: Once | INTRAVENOUS | Status: AC
  Administered 2017-05-14: 500 mL via INTRAVENOUS

## 2017-05-14 MED ORDER — ALBUTEROL SULFATE HFA 108 (90 BASE) MCG/ACT IN AERS: 1.0000 | INHALATION_SPRAY | Freq: Four times a day (QID) | RESPIRATORY_TRACT | 0 refills | Status: DC | PRN

## 2017-05-14 NOTE — ED Notes (Signed)
Pt given sandwich bag to eat.

## 2017-05-14 NOTE — ED Triage Notes (Signed)
Pt states generalized body aches and SOB X1 week. She has trach that was placed in October. Some wheezing noted. Pt afebrile.

## 2017-05-14 NOTE — ED Notes (Signed)
Pt verbalized understanding of prednisone use and to stop taking crestor and has no further questions. Pt stable and NAD. VSS

## 2017-05-14 NOTE — ED Provider Notes (Signed)
[facility] DEPT Provider Note   CSN: [id number] Arrival date & time: 05/14/17  1223     History   Chief Complaint Chief Complaint  Patient presents with  . Generalized Body Aches  . Shortness of Breath    HPI [name] is a 71 y.o. female.  HPI   71 year old female with history of COPD, CHF, difficult airway presenting for evaluation of shortness of breath and generalized body aches. Patient report for more than a month she has had progressive worsening generalized body aches as well as having trouble breathing. States she is hurting everywhere including chest, arms and legs and trunk. Sometimes she can walk it off but pain is present both with exertion and with rest. Endorse having congestion, occasional sneezing, and cough that is nonproductive. Denies having fever, headache, back pain, nausea vomiting diarrhea, dysuria, or rash. She has a trach placed in October to help with breathing. It has helped. She denies any recent medication changes. She however is on a statin medication.  Past Medical History:  Diagnosis Date  . Anemia   . Angina   . Angioedema    2/2 ACE  . Arteriovenous malformation of stomach   . Arthritis   . Asthma   . AVM (arteriovenous malformation) of colon    small intestine; stomach  . Blood transfusion   . CHF (congestive heart failure) ([place])   . Complete heart block ([place])    s/p PPM 1998  . Complication of anesthesia    Difficult airway; anaphylaxis and swelling with propofol  . DDD (degenerative disc disease)   . Depression   . Diastolic heart failure   . Fatty liver 07/26/10  . GERD (gastroesophageal reflux disease)   . GI bleed   . Headache   . History of alcohol abuse Stopped Fall 2012  . History of tobacco use Quit Fall 2012  . Hx of cardiovascular stress test    a. Lexiscan Myoview (10/15):  Small inferolateral and apical defect c/w scar and poss soft tissue attenuation, no ischemia, EF 42%  . Hx of colonic polyp 08/13/10     adenomatous  . Hx of colonoscopy   . Hyperlipidemia   . Hyperlipidemia   . Hypertension   . Hypertrophic cardiomyopathy ([place])    dx by Dr [name] 2009  . Iron deficiency anemia   . Panic attack   . Panic attacks   . Permanent atrial fibrillation ([place])   . Pneumonia   . Renal failure    baseline creatinine 1.6  . Right arm pain 01/08/2012  . RLS (restless legs syndrome)    Dx 06/2007  . Shortness of breath    sob on exertation  . Sleep apnea     Patient Active Problem List   Diagnosis Date Noted  . Chronic idiopathic constipation 03/19/2017  . Loss of weight 01/26/2017  . 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 Permanent atrial fibrillation ([place])   . Laryngeal stenosis 09/18/2016  . Somnolence   . Acute respiratory failure with hypoxia ([place])   . Palpitations   . Dyspnea on exertion   . Polymyalgia rheumatica ([place]) 04/10/2015  . Low back pain 03/28/2015  . Hypertrophic cardiomyopathy ([place]) 02/15/2015  . Eczema 09/18/2014  . Environmental allergies 09/18/2014  . Pre-diabetes 11/26/2013  . COPD, moderate ([place]) 11/01/2013  . Cough 07/15/2013  . Preventative health care 08/18/2012  . Complete heart block ([place]) 04/03/2011  . Anemia 02/17/2011  . AVM (arteriovenous malformation) 02/13/2011  . PACEMAKER-[place] 11/28/2010  . CHRONIC KIDNEY  DISEASE STAGE III (MODERATE) 09/24/2010  . Acute on chronic congestive heart failure ([place]) 08/30/2010  . ARTHRITIS 07/24/2010  . Generalized anxiety disorder 07/23/2010  . Hyperlipidemia 02/11/2007  . Essential hypertension 02/11/2007  . APNEA, SLEEP 02/11/2007    Past Surgical History:  Procedure Laterality Date  . CARDIAC CATHETERIZATION    . CARDIAC CATHETERIZATION N/A 08/19/2016   Procedure: Right/Left Heart Cath and Coronary Angiography;  Surgeon: Jolaine Artist, MD;  Location: [facility];  Service: Cardiovascular;  Laterality: N/A;  . DIRECT LARYNGOSCOPY N/A 09/18/2016   Procedure: DIRECT LARYNGOSCOPY, BRONCHOSCOPY,  REMOVAL OF INTUBATION GRANULOMA;  Surgeon: [name], MD;  Location: [facility] OR;  Service: ENT;  Laterality: N/A;  . DIRECT LARYNGOSCOPY N/A 10/20/2016   Procedure: EXTUBATION AND FLEXIBLE LARYNGOSCOPE;  Surgeon: [name], MD;  Location: [facility];  Service: ENT;  Laterality: N/A;  . DIRECT LARYNGOSCOPY N/A 10/29/2016   Procedure: DIRECT LARYNGOSCOPY;  Surgeon: [name], MD;  Location: [facility] OR;  Service: ENT;  Laterality: N/A;  . ESOPHAGOGASTRODUODENOSCOPY  12/23/2011   Procedure: ESOPHAGOGASTRODUODENOSCOPY (EGD);  Surgeon: [name], MD;  Location: [facility];  Service: Endoscopy;  Laterality: N/A;  . EXTUBATION (ENDOTRACHEAL) IN OR N/A 07/21/2016   Procedure: EXTUBATION (ENDOTRACHEAL) IN OR;  Surgeon: [name], MD;  Location: [facility];  Service: ENT;  Laterality: N/A;  . GIVENS CAPSULE STUDY  12/23/2011   Procedure: GIVENS CAPSULE STUDY;  Surgeon: [name], MD;  Location: [facility];  Service: Endoscopy;  Laterality: N/A;  . MICROLARYNGOSCOPY WITH LASER N/A 10/16/2016   Procedure: MICRODIRECTLARYNGOSCOPY WITH LASER ABLATION AND KENLOG INJECTION;  Surgeon: [name], MD;  Location: [facility];  Service: ENT;  Laterality: N/A;  . PACEMAKER INSERTION  1998   st jude, most recent gen change by [name] 4/12  . TRACHEOSTOMY TUBE PLACEMENT N/A 10/29/2016   Procedure: TRACHEOSTOMY;  Surgeon: [name], MD;  Location: [facility];  Service: ENT;  Laterality: N/A;  . TUBAL LIGATION  04/01/2000    OB History    No data available       Home Medications    Prior to Admission medications   Medication Sig Start Date End Date Taking? Authorizing Provider  acetaminophen (TYLENOL) 325 MG tablet Take 325 mg by mouth daily as needed (pain).    [provider]  albuterol (PROVENTIL) (2.5 MG/3ML) 0.083% nebulizer solution Take 3 mLs (2.5 mg total) by nebulization every 6 (six) hours as needed for wheezing or shortness of breath. Patient not taking: Reported on 04/20/2017 08/11/16   Ngetich, Nelda Bucks, NP  ALPRAZolam (XANAX) 0.5 MG tablet Take 1 tablet (0.5 mg total) by mouth at bedtime as needed for anxiety. 01/26/17   Steve Rattler, DO  aspirin EC 81 MG tablet Take 81 mg by mouth every morning.     [provider]  bisacodyl (DULCOLAX) 10 MG suppository Place 10 mg rectally daily as needed for mild constipation.     [provider]  CALCIUM PO Take 1 tablet by mouth once a week.     [provider]  feeding supplement, ENSURE ENLIVE, (ENSURE ENLIVE) LIQD Take 237 mLs by mouth 2 (two) times daily between meals. 11/15/16   Sela Hilding, MD  ferrous sulfate 325 (65 FE) MG tablet TAKE 1 TABLET BY MOUTH TWICE DAILY WITH MEALS TO KEEP BLOOD COUNT UP Patient taking differently: Take 325 mg by mouth 3 (three) times daily. TO KEEP BLOOD COUNT UP 08/26/16   Steve Rattler, DO  furosemide (LASIX) 40 MG tablet Take 1 tablet (40 mg total) by mouth daily. Take one tablet daily 01/28/17   Lucila Maine C, DO  metoprolol tartrate (LOPRESSOR) 25 MG tablet TAKE 1 TABLET BY MOUTH EVERY 6 HOURS AS NEEDED FOR HEART FLUTTERING 04/08/17   Allred, Jeneen Rinks, MD  nitroGLYCERIN (NITROSTAT) 0.4 MG SL tablet Place 1 tablet (0.4 mg total) under the tongue every 5 (five) minutes as needed for chest pain. Patient not taking: Reported on 04/20/2017 01/18/15   Eileen Stanford, PA-C  omeprazole (PRILOSEC) 20 MG capsule Take 1 capsule (20 mg total) by mouth daily. Patient not taking: Reported on 04/20/2017 08/26/16   Lucila Maine C, DO  polyethylene glycol (MIRALAX / GLYCOLAX) packet Take 17 g by mouth daily. 07/26/16   Domenic Polite, MD  potassium chloride (K-DUR) 10 MEQ tablet Take 40 mEq by mouth daily.    [provider]  rosuvastatin (CRESTOR) 20 MG tablet Take 1 tablet (20 mg total) by mouth daily. 08/26/16   Steve Rattler, DO  senna (SENOKOT) 8.6 MG TABS tablet Take 1 tablet (8.6 mg total) by mouth at bedtime. 03/19/17   Steve Rattler, DO  trolamine salicylate (ASPERCREME) 10  % cream Apply 1 application topically 2 (two) times daily as needed for muscle pain.     [provider]    Family History Family History  Problem Relation Age of Onset  . Hypertension Mother   . Stroke Mother   . Heart disease Mother   . Aneurysm Mother   . CVA Mother   . Hypertension Father   . Stroke Father   . Heart attack Father   . Alcohol abuse Father   . Hypertension Sister   . Hypertension Brother   . Colon cancer Brother   . Cancer Brother   . Diabetes Sister   . Diabetes Brother   . Heart attack Brother   . Heart attack Brother   . Heart disease Sister   . Alcohol abuse Brother   . Heart disease Daughter     Social History Social History  Substance Use Topics  . Smoking status: Former Smoker    Packs/day: 0.10    Years: 50.00    Types: Cigarettes    Quit date: 08/16/2011  . Smokeless tobacco: Never Used     Comment: quit 2012  . Alcohol use No     Comment: quit 08/16/2011     Allergies   Ace inhibitors; Other; and Propofol   Review of Systems Review of Systems  All other systems reviewed and are negative.    Physical Exam Updated Vital Signs BP 111/73 (BP Location: Right Arm)   Pulse 69   Temp 97.3 F (36.3 C) (Oral)   Resp (!) 42   SpO2 100%   Physical Exam  Constitutional: She is oriented to person, place, and time. She appears well-developed and well-nourished. No distress.  HENT:  Head: Atraumatic.  Eyes: Conjunctivae are normal.  Neck: Neck supple.  Lurline Idol is in place with Passy-Muir valve, appears to be in good functioning order.  Cardiovascular: Normal rate, regular rhythm and intact distal pulses.   Pulmonary/Chest: Effort normal and breath sounds normal.  Faint scattered rhonchi without any wheezes, rales  Abdominal: Soft.  Musculoskeletal: She exhibits no edema.  Neurological: She is alert and oriented to person, place, and time.  Skin: No rash noted.  Psychiatric: She has a normal mood and affect.  Nursing note and  vitals reviewed.    ED  Treatments / Results  Labs (all labs ordered are listed, but only abnormal results are displayed) Labs Reviewed  COMPREHENSIVE METABOLIC PANEL - Abnormal; Notable for the following:       Result Value   Glucose, Bld 108 (*)    BUN 46 (*)    Creatinine, Ser 2.02 (*)    Calcium 10.5 (*)    ALT 9 (*)    GFR calc non Af Amer 23 (*)    GFR calc Af Amer 27 (*)    All other components within normal limits  CBC WITH DIFFERENTIAL/PLATELET - Abnormal; Notable for the following:    RBC 3.81 (*)    Hemoglobin 9.7 (*)    HCT 31.3 (*)    MCH 25.5 (*)    All other components within normal limits  I-STAT TROPOININ, ED - Abnormal; Notable for the following:    Troponin i, poc 0.14 (*)    All other components within normal limits  CK  I-STAT CG4 LACTIC ACID, ED    EKG  EKG Interpretation  Date/Time:  Thursday May 14 2017 12:35:19 EDT Ventricular Rate:  70 PR Interval:    QRS Duration: 232 QT Interval:  518 QTC Calculation: 559 R Axis:   -19 Text Interpretation:  Sinus rhythm with complete heart block and Ventricular-paced rhythm Abnormal ECG No significant change since last tracing Confirmed by Thomasene Lot, [place] [phone]) on 05/14/2017 12:39:03 PM Also confirmed by Thomasene Lot, [place] [phone]), editor Drema Pry [phone])  on 05/14/2017 1:19:50 PM       Radiology Dg Chest 2 View  Result Date: 05/14/2017 CLINICAL DATA:  Cough, chest soreness, weakness. Tracheostomy to patient. EXAM: CHEST  2 VIEW COMPARISON:  PA and lateral chest x-ray of March 22, 2017 FINDINGS: The lungs are well-expanded. The interstitial markings are mildly prominent though stable. The heart is mildly enlarged but stable. The pulmonary vascularity is normal. The mediastinum is normal in width. There is calcification in the wall of the aortic arch. There is no pleural effusion. The bony thorax exhibits no acute abnormality. The ICD is in stable position. The tracheostomy tube tip projects  approximately 8 mm above the superior margin of the clavicular heads. IMPRESSION: No acute cardiopulmonary abnormality. Mild chronic bronchitic changes, stable. Thoracic aortic atherosclerosis. Electronically Signed   By: David  [name] M.D.   On: 05/14/2017 13:08    Procedures Procedures (including critical care time)  Medications Ordered in ED Medications  sodium chloride 0.9 % bolus 500 mL (not administered)  predniSONE (DELTASONE) tablet 60 mg (not administered)  albuterol (PROVENTIL) (2.5 MG/3ML) 0.083% nebulizer solution 5 mg (5 mg Nebulization Given 05/14/17 1240)     Initial Impression / Assessment and Plan / ED Course  I have reviewed the triage vital signs and the nursing notes.  Pertinent labs & imaging results that were available during my care of the patient were reviewed by me and considered in my medical decision making (see chart for details).     BP 122/74   Pulse 70   Temp 97.3 F (36.3 C) (Oral)   Resp 20   SpO2 100%    Final Clinical Impressions(s) / ED Diagnoses   Final diagnoses:  Myalgia  Bronchitis    New Prescriptions New Prescriptions   ALBUTEROL (PROVENTIL HFA;VENTOLIN HFA) 108 (90 BASE) MCG/ACT INHALER    Inhale 1-2 puffs into the lungs every 6 (six) hours as needed for wheezing or shortness of breath.   PREDNISONE (DELTASONE) 20 MG TABLET    3 tabs po  day one, then 2 tabs daily x 4 days   Chronic SOB and now c/o month long myalgias.  Work up [facility].  Pt on statin. Pt otherwise well appearing.  Her CXR showing no acute changes.  EKG is unchanged.  Normal lactic acid.  Elevated trop of 0.14, however it appears to be trending down from prior.  She does endorse CP along with her generalized body aches.  Sxs doubtful for ACS.  Cr mildly elevated to 2.02 from 1.84 a month ago. Will give a small bolus (543ml) of NS as hydration.  Hgb of 9.7, near baseline.  Pt initially has mild wheezing on exam which improves with albuterol nebs.  Will check O2 on  ambulation.  Hx of CHF, with EF 55-60%.  Does not appears fluid overload (no JVD, pedal edema, wet lung).    Pt sign out to oncoming provider Dr. Oleta Mouse who will f/u on repeat troponin and CK.  IF elevated, will consider hydration and d/c Crestor as it may be causative factor of her myalgias. Suspect COPD exacerbation.  pt will benefit from steroid at discharge.  Care discussed with Dr. Billy Fischer.    3:45 PM Mildly elevated delta troponin of 0.18 from 0.14.  Pt has prior cath (08/19/16) which revealed normal coronary arteries.  I discussed this with DR. Schlossman.  We have low suspicion for ACS, since pt has elevated trop in the past with normal coronary arteries on cath.  Will repeat another troponin at 5p.  Low suspicion for PE.     Domenic Moras, PA-C 05/14/17 1559    Gareth Morgan, MD 05/18/17 [phone]

## 2017-05-14 NOTE — ED Notes (Signed)
I stat troponin results given to Dr. Billy Fischer

## 2017-05-14 NOTE — Discharge Instructions (Signed)
You have been evaluated for your shortness of breath.  This is likely a COPD exacerbation.  Please take steroid and use albuterol inhaler as prescribed.  Your muscle aches are likely due to reaction from taking Crestor.  Please discontinue this medication and follow up with your doctor for further care.  Return if you have any concerns.

## 2017-05-14 NOTE — ED Provider Notes (Signed)
Please see previous physicians note regarding patient's presenting history and physical, initial ED course, and associated medical decision making.   In short this is a 71 year old female with history of atrial fibrillation, asthma, diastolic heart failure who presents with 1 month of generalized body aches with shortness of breath felt to be consistent with acute COPD/asthma exacerbation.  At time of sign out she is pending repeat troponin and CK. With chronically elevated troponin, and troponin today is lower than where it has been elevated in the past. Records reviewed. She does have prior echo showing normal EF but evidence of diastolic heart failure. She had a left heart catheterization in September 2017 showing normal coronaries. Normal CK. Troponin downtrending to 0.14, and chronically elevated. Breathing comfortably. No hypoxia or dyspnea. Feels improved. Felt stable for discharge home.   Strict return and follow-up instructions reviewed. She expressed understanding of all discharge instructions and felt comfortable with the plan of care.    Forde Dandy, MD 05/14/17 [phone]

## 2017-05-21 DIAGNOSIS — J449 Chronic obstructive pulmonary disease, unspecified: Secondary | ICD-10-CM | POA: Diagnosis not present

## 2017-05-21 DIAGNOSIS — M6281 Muscle weakness (generalized): Secondary | ICD-10-CM | POA: Diagnosis not present

## 2017-05-22 ENCOUNTER — Telehealth: Payer: Self-pay | Admitting: *Deleted

## 2017-05-22 NOTE — Telephone Encounter (Signed)
Pt. Was seen in the ED on 5/31 with abnormal Troponin levels after seeing you in April in the office.  She is scheduled for ECL on 7/3.  Do you think she will need another office visit with you due to the recent changes?  Please advise.     Pamala Hurry PV

## 2017-05-22 NOTE — Telephone Encounter (Signed)
She will need cardiac clearance prior to proceeding with Endo and colonoscopy. Thanks

## 2017-05-22 NOTE — Telephone Encounter (Signed)
Dr. Janey Greaser, Reviewing patient's chart today for pv 6/15. Pt. Is scheduled for a double with Propofol at [facility]. Chart states she has allergy to propofol of anaphylaxis. Please advise.  Pamala Hurry PV

## 2017-05-26 ENCOUNTER — Ambulatory Visit: Payer: Medicare Other | Admitting: Family Medicine

## 2017-05-26 DIAGNOSIS — J449 Chronic obstructive pulmonary disease, unspecified: Secondary | ICD-10-CM | POA: Diagnosis not present

## 2017-05-26 DIAGNOSIS — Z930 Tracheostomy status: Secondary | ICD-10-CM | POA: Diagnosis not present

## 2017-05-26 DIAGNOSIS — I4891 Unspecified atrial fibrillation: Secondary | ICD-10-CM | POA: Diagnosis not present

## 2017-05-26 DIAGNOSIS — D649 Anemia, unspecified: Secondary | ICD-10-CM | POA: Diagnosis not present

## 2017-05-26 DIAGNOSIS — J398 Other specified diseases of upper respiratory tract: Secondary | ICD-10-CM | POA: Diagnosis not present

## 2017-05-29 ENCOUNTER — Ambulatory Visit (AMBULATORY_SURGERY_CENTER): Payer: Self-pay

## 2017-05-29 VITALS — Ht 64.0 in | Wt 137.0 lb

## 2017-05-29 DIAGNOSIS — D509 Iron deficiency anemia, unspecified: Secondary | ICD-10-CM

## 2017-05-29 DIAGNOSIS — Z860101 Personal history of adenomatous and serrated colon polyps: Secondary | ICD-10-CM

## 2017-05-29 DIAGNOSIS — Z86010 Personal history of colonic polyps: Secondary | ICD-10-CM

## 2017-05-29 MED ORDER — SUPREP BOWEL PREP KIT 17.5-3.13-1.6 GM/177ML PO SOLN: 1.0000 | Freq: Once | ORAL | 0 refills | Status: AC

## 2017-05-29 NOTE — Progress Notes (Signed)
No allergies to eggs or soy No diet meds No home oxygen  Severe situation related to Propofol Now has trach  Declined emmi

## 2017-06-02 ENCOUNTER — Encounter: Payer: Self-pay | Admitting: Family Medicine

## 2017-06-02 ENCOUNTER — Ambulatory Visit (INDEPENDENT_AMBULATORY_CARE_PROVIDER_SITE_OTHER): Payer: Medicare Other | Admitting: Family Medicine

## 2017-06-02 VITALS — BP 110/70 | HR 70 | Temp 98.3°F | Ht 64.0 in | Wt 142.0 lb

## 2017-06-02 DIAGNOSIS — J398 Other specified diseases of upper respiratory tract: Secondary | ICD-10-CM

## 2017-06-02 DIAGNOSIS — N183 Chronic kidney disease, stage 3 unspecified: Secondary | ICD-10-CM

## 2017-06-02 DIAGNOSIS — E440 Moderate protein-calorie malnutrition: Secondary | ICD-10-CM | POA: Diagnosis not present

## 2017-06-02 DIAGNOSIS — R634 Abnormal weight loss: Secondary | ICD-10-CM

## 2017-06-02 MED ORDER — GUAIFENESIN-DM 100-10 MG/5ML PO SYRP: 5.0000 mL | ORAL_SOLUTION | ORAL | 2 refills | Status: DC | PRN

## 2017-06-02 NOTE — Assessment & Plan Note (Signed)
  Trach in place, patient complaining of secretions. Did not want to give robinul as this may thicken and worsen secretions  -rx given for robitussin DM cough syrup  -advised patient to continue to use humidifier due to [facility] drying out air -follow up with ENT as scheduled

## 2017-06-02 NOTE — Patient Instructions (Addendum)
-   Look for a generic brand of a nutritional drink like Ensure.  For example, try [employer] or [employer].  Bring an empty Ensure bottle, and ask the pharmacist for a product that is similar.  You can also ask if there is a lactose-free version.    - Another way you can tolerate foods with lactose (without experiencing gas) is to take a pill or use drops of Lactaid.  A pharmacist can also tell you about this.   - If the other nutritional drinks are too expensive, try using 12 oz of milk, with some Ovaltine mixed in.  This is usually found on the baking supplies aisle in the grocery store.   Specific Goals:  - Aim for 3 real meals each day, and at least one snack each day.    - To make this happen, it will be helpful if you plan the night before.    - Do this BEFORE dinner, and write it down! - Make sure you get some protein foods at each meal.            - Protein foods: Meat, fish, poultry, eggs, dairy foods, and beans such as               pinto and kidney beans (beans also provide carbohydrate).             If you are using beans as your main protein food, you need at least a full cup of            beans to get enough protein.  - Be sure to drink at least one large glass (12-16 oz) of water in-between meals.    - Talk with Ander Purpura, our Health Coach, about getting a home health aide: [phone].

## 2017-06-02 NOTE — Progress Notes (Signed)
    Subjective:    Patient ID: [name], female    DOB: 12-03-1946, 71 y.o.   MRN: [id number]   CC: follow up chronic medical issues  Weight loss Weight has been stable, she reports she fluctuates several pounds up and down.. Eating is verygood in the afternoon but in the mornings she only does ensure, maybe some boiled eggs or grits. Does not eat much at dinner. Trying to drink more water. Meeting with dietician today.   Lurline Idol Very stressed about the trach and wants to get it out. Follows up with ENT in early august. Has had persistent secretions issues. reports a lot of mucous, cannot suction it out, feels like she is choking on it at night. Has tried mucinex in the past. Has used humidifier but the replacement "wicks" are expensive and her ENT told her to stop in the summer. She has an [facility] window unit she uses. She tries to cough up secretions but it does not work. Reports a lot of anxiety over this and her breathing at night. Denies SOB or CP.   Body aches/ED visit Was seen in ED for this recently, was told she was dehydrated and to stop Crestor. She did this. No improvement in body aches. She was given a 5 day course of steroids for COPD exacerbation at that time as well. Felt steroids helped energy a lot. Was using inhaler with no relief, used nebulizer with a lot of relief. Still reports body aches. No fevers or chills. No recent illnesses.   Smoking status reviewed- former smoker  Review of Systems- see HPI  Objective:  BP 110/70   Pulse 70   Temp 98.3 F (36.8 C) (Oral)   Ht 5\' 4"  (1.626 m)   Wt 142 lb (64.4 kg)   SpO2 98%   BMI 24.37 kg/m  Vitals and nursing note reviewed  General: well nourished, in no acute distress Cardiac: RRR, clear S1 and S2, no murmurs, rubs, or gallops Respiratory: clear to auscultation bilaterally, no increased work of breathing Extremities: no edema or cyanosis Neuro: alert and oriented, no focal deficits   Assessment & Plan:     Loss of weight  Weight improved from last visit, 147 pounds today. Secondary to poor PO intake  -continue ensure supplements -continue to follow with RD Dr. Jenne Campus -follow up 3 months  Tracheal stenosis  Trach in place, patient complaining of secretions. Did not want to give robinul as this may thicken and worsen secretions  -rx given for robitussin DM cough syrup  -advised patient to continue to use humidifier due to [facility] drying out air -follow up with ENT as scheduled  Body aches Unclear etiology, possibly due to deconditioning in general, CK normal in ED  -advised patient to resume statin to reduce CV risk as stopping this did not improve body aches -follow up if symptoms worsen  Return in about 3 months (around 09/02/2017).   Lucila Maine, DO Family Medicine Resident PGY-1

## 2017-06-02 NOTE — Patient Instructions (Signed)
  Please try using the cough syrup 5 mL every 4 hours as you need it.  Then use the humidifier as the air conditioner you have can dry out the air.  If you have questions or concerns please do not hesitate to call at [phone].  Lucila Maine, DO PGY-1, [facility] 06/02/2017 2:16 PM

## 2017-06-02 NOTE — Progress Notes (Signed)
Medical Nutrition Therapy:  Appt start time: 1430 end time:  4827. Gastroenterologist: Leta Jungling, MD PCP: Lucila Maine, DO  NOTE: Poor reading level; daughter helps her with this.   Assessment:  Primary concerns today: CKD dietary management, BG control, and unintentional weight loss (N18.3, E11.8; R63.4). Ms. Muradyan was recently in the ED with dehydration.  She has not made many (any?) changes since her first nutrition appt in early May.  She still says she is just not hungry.    Learning Readiness: Ready  Usual eating pattern includes 1 meal and 2-3 snacks per day. Frequent foods and beverages include fruit, cucumbers, tomatoes, chx, frzn chx pot pie, eggs, Oodles of Noodles, water, and 1 Ensure/day.  Usual physical activity includes ~10-15 min of calisthenics 2-3 X wk at home by herself.    24-hr recall:  (Up at 9 AM) B (10 AM)-  1 Ensure, 1 toast with 1 T peanut butter Snk ( AM)-  --- L (1 PM)-  1 slc sweet potato pie, water Snk ( PM)-  ? D (5 PM)-  1 slc sweet potato pie, water Snk ( PM)-  ? Typical day? No. Usually has breakfast of 1 egg and an Ensure, plus one other meal in the day, with a snack or two.    Progress Towards Goal(s):  In progress.   Nutritional Diagnosis:  NB-1.1 Food and nutrition-related knowledge deficit As related to fundamental nutrition.  As evidenced by patient's confusion as to which foods provide protein.    Intervention:  Nutrition education  Handouts given during visit include:  AVS, large print  Demonstrated degree of understanding via:  Teach Back  Barriers to learning/adherence to lifestyle change: Poor appetite  Monitoring/Evaluation:  Dietary intake, exercise, and body weight in 5 week(s).

## 2017-06-02 NOTE — Assessment & Plan Note (Signed)
  Weight improved from last visit, 147 pounds today. Secondary to poor PO intake  -continue ensure supplements -continue to follow with RD Dr. Jenne Campus -follow up 3 months

## 2017-06-04 DIAGNOSIS — Z930 Tracheostomy status: Secondary | ICD-10-CM | POA: Diagnosis not present

## 2017-06-04 DIAGNOSIS — J449 Chronic obstructive pulmonary disease, unspecified: Secondary | ICD-10-CM | POA: Diagnosis not present

## 2017-06-10 DIAGNOSIS — Z430 Encounter for attention to tracheostomy: Secondary | ICD-10-CM | POA: Diagnosis not present

## 2017-06-10 DIAGNOSIS — Z930 Tracheostomy status: Secondary | ICD-10-CM | POA: Diagnosis not present

## 2017-06-10 DIAGNOSIS — J386 Stenosis of larynx: Secondary | ICD-10-CM | POA: Diagnosis not present

## 2017-06-10 DIAGNOSIS — G4733 Obstructive sleep apnea (adult) (pediatric): Secondary | ICD-10-CM | POA: Diagnosis not present

## 2017-06-10 DIAGNOSIS — Z87891 Personal history of nicotine dependence: Secondary | ICD-10-CM | POA: Diagnosis not present

## 2017-06-11 ENCOUNTER — Encounter (HOSPITAL_COMMUNITY): Payer: Self-pay | Admitting: *Deleted

## 2017-06-14 ENCOUNTER — Emergency Department (HOSPITAL_COMMUNITY): Payer: Medicare Other

## 2017-06-14 ENCOUNTER — Other Ambulatory Visit: Payer: Self-pay

## 2017-06-14 ENCOUNTER — Encounter (HOSPITAL_COMMUNITY): Payer: Self-pay | Admitting: Emergency Medicine

## 2017-06-14 ENCOUNTER — Emergency Department (HOSPITAL_COMMUNITY)
Admission: EM | Admit: 2017-06-14 | Discharge: 2017-06-14 | Disposition: A | Discharge status: Home / Self Care | Payer: Medicare Other | Attending: Emergency Medicine | Admitting: Emergency Medicine

## 2017-06-14 DIAGNOSIS — Z87891 Personal history of nicotine dependence: Secondary | ICD-10-CM | POA: Diagnosis not present

## 2017-06-14 DIAGNOSIS — I509 Heart failure, unspecified: Secondary | ICD-10-CM | POA: Insufficient documentation

## 2017-06-14 DIAGNOSIS — I4891 Unspecified atrial fibrillation: Secondary | ICD-10-CM | POA: Diagnosis not present

## 2017-06-14 DIAGNOSIS — D649 Anemia, unspecified: Secondary | ICD-10-CM | POA: Diagnosis not present

## 2017-06-14 DIAGNOSIS — F419 Anxiety disorder, unspecified: Secondary | ICD-10-CM | POA: Diagnosis not present

## 2017-06-14 DIAGNOSIS — R05 Cough: Secondary | ICD-10-CM | POA: Diagnosis present

## 2017-06-14 DIAGNOSIS — R0602 Shortness of breath: Secondary | ICD-10-CM | POA: Diagnosis not present

## 2017-06-14 DIAGNOSIS — M79643 Pain in unspecified hand: Secondary | ICD-10-CM | POA: Diagnosis not present

## 2017-06-14 DIAGNOSIS — J45909 Unspecified asthma, uncomplicated: Secondary | ICD-10-CM | POA: Diagnosis not present

## 2017-06-14 DIAGNOSIS — I10 Essential (primary) hypertension: Secondary | ICD-10-CM | POA: Diagnosis not present

## 2017-06-14 DIAGNOSIS — J441 Chronic obstructive pulmonary disease with (acute) exacerbation: Secondary | ICD-10-CM | POA: Insufficient documentation

## 2017-06-14 DIAGNOSIS — Z79899 Other long term (current) drug therapy: Secondary | ICD-10-CM | POA: Insufficient documentation

## 2017-06-14 DIAGNOSIS — Z950 Presence of cardiac pacemaker: Secondary | ICD-10-CM | POA: Diagnosis not present

## 2017-06-14 DIAGNOSIS — J410 Simple chronic bronchitis: Secondary | ICD-10-CM | POA: Diagnosis not present

## 2017-06-14 DIAGNOSIS — I110 Hypertensive heart disease with heart failure: Secondary | ICD-10-CM | POA: Diagnosis not present

## 2017-06-14 LAB — CBC WITH DIFFERENTIAL/PLATELET
Basophils Absolute: 0 10*3/uL (ref 0.0–0.1)
Basophils Relative: 0 %
Eosinophils Absolute: 0.1 10*3/uL (ref 0.0–0.7)
Eosinophils Relative: 2 %
HCT: 28.9 % — ABNORMAL LOW (ref 36.0–46.0)
HEMOGLOBIN: 9 g/dL — AB (ref 12.0–15.0)
LYMPHS ABS: 1.7 10*3/uL (ref 0.7–4.0)
Lymphocytes Relative: 33 %
MCH: 25.6 pg — AB (ref 26.0–34.0)
MCHC: 31.1 g/dL (ref 30.0–36.0)
MCV: 82.1 fL (ref 78.0–100.0)
MONOS PCT: 11 %
Monocytes Absolute: 0.6 10*3/uL (ref 0.1–1.0)
NEUTROS ABS: 2.8 10*3/uL (ref 1.7–7.7)
NEUTROS PCT: 54 %
Platelets: 348 10*3/uL (ref 150–400)
RBC: 3.52 MIL/uL — ABNORMAL LOW (ref 3.87–5.11)
RDW: 16.6 % — ABNORMAL HIGH (ref 11.5–15.5)
WBC: 5.2 10*3/uL (ref 4.0–10.5)

## 2017-06-14 LAB — HEPATIC FUNCTION PANEL
ALT: 10 U/L — ABNORMAL LOW (ref 14–54)
AST: 22 U/L (ref 15–41)
Albumin: 3.4 g/dL — ABNORMAL LOW (ref 3.5–5.0)
Alkaline Phosphatase: 58 U/L (ref 38–126)
BILIRUBIN TOTAL: 0.5 mg/dL (ref 0.3–1.2)
Total Protein: 6.8 g/dL (ref 6.5–8.1)

## 2017-06-14 LAB — I-STAT CHEM 8, ED
BUN: 35 mg/dL — AB (ref 6–20)
CALCIUM ION: 1.27 mmol/L (ref 1.15–1.40)
CREATININE: 1.7 mg/dL — AB (ref 0.44–1.00)
Chloride: 104 mmol/L (ref 101–111)
Glucose, Bld: 106 mg/dL — ABNORMAL HIGH (ref 65–99)
HCT: 29 % — ABNORMAL LOW (ref 36.0–46.0)
Hemoglobin: 9.9 g/dL — ABNORMAL LOW (ref 12.0–15.0)
Potassium: 3.9 mmol/L (ref 3.5–5.1)
Sodium: 140 mmol/L (ref 135–145)
TCO2: 26 mmol/L (ref 0–100)

## 2017-06-14 LAB — PROTIME-INR
INR: 1.05
PROTHROMBIN TIME: 13.7 s (ref 11.4–15.2)

## 2017-06-14 MED ORDER — FUROSEMIDE 10 MG/ML IJ SOLN
40.0000 mg | Freq: Once | INTRAMUSCULAR | Status: AC
  Administered 2017-06-14: 40 mg via INTRAVENOUS
  Filled 2017-06-14: qty 4

## 2017-06-14 MED ORDER — IPRATROPIUM BROMIDE 0.02 % IN SOLN
0.5000 mg | Freq: Once | RESPIRATORY_TRACT | Status: AC
  Administered 2017-06-14: 0.5 mg via RESPIRATORY_TRACT
  Filled 2017-06-14: qty 2.5

## 2017-06-14 MED ORDER — METOCLOPRAMIDE HCL 5 MG/ML IJ SOLN
10.0000 mg | Freq: Once | INTRAMUSCULAR | Status: AC
  Administered 2017-06-14: 10 mg via INTRAVENOUS
  Filled 2017-06-14: qty 2

## 2017-06-14 MED ORDER — ALBUTEROL (5 MG/ML) CONTINUOUS INHALATION SOLN
10.0000 mg/h | INHALATION_SOLUTION | Freq: Once | RESPIRATORY_TRACT | Status: AC
  Administered 2017-06-14: 10 mg/h via RESPIRATORY_TRACT
  Filled 2017-06-14: qty 20

## 2017-06-14 MED ORDER — ACETAMINOPHEN 325 MG PO TABS
650.0000 mg | ORAL_TABLET | Freq: Once | ORAL | Status: AC
  Administered 2017-06-14: 650 mg via ORAL
  Filled 2017-06-14: qty 2

## 2017-06-14 MED ORDER — METHYLPREDNISOLONE SODIUM SUCC 125 MG IJ SOLR
125.0000 mg | Freq: Once | INTRAMUSCULAR | Status: AC
  Administered 2017-06-14: 125 mg via INTRAVENOUS
  Filled 2017-06-14: qty 2

## 2017-06-14 NOTE — ED Triage Notes (Signed)
Received pt from home with c/o shortness of breath and B/L hands and feet pain. Pt had a trach placed in Nov or Dec and had the inner cannula changed this past Wednesday. Per EMS pt is having a hard time adjusting to having a trach and pt nodded her head in agreement. Pt has anxiety medication at home but did not take any today.

## 2017-06-14 NOTE — ED Provider Notes (Signed)
[facility] DEPT Provider Note   CSN: [id number] Arrival date & time:        History   Chief Complaint Chief Complaint  Patient presents with  . Anxiety  . Foot Pain  . Hand Pain    HPI [name] is a 71 y.o. female.  HPI Patient with capped trach, PMH of anemia, GI AVM's, recent hospitalizations requiring RBC transfusions, laryngeal stenosis 2/2 prior intubation, CHF, afib, complete heart block s/p St. Jude Pacemaker presents with cc of weakness, cough, shortness of breath and achy pain. Pt has had the pain and the weaknessfor 1 year. Pt has had increased cough and yellow sputum and associated dib for the past few days. Pt has no fevers, + chills. Pt also has wheezing.   Past Medical History:  Diagnosis Date  . Anemia   . Angina   . Angioedema    2/2 ACE  . Arteriovenous malformation of stomach   . Arthritis   . Asthma   . AVM (arteriovenous malformation) of colon    small intestine; stomach  . Blood transfusion   . CHF (congestive heart failure) ([place])   . Complete heart block ([place])    s/p PPM 1998  . Complication of anesthesia    Difficult airway; anaphylaxis and swelling with propofol  . DDD (degenerative disc disease)   . Depression   . Diastolic heart failure   . Fatty liver 07/26/10  . GERD (gastroesophageal reflux disease)   . GI bleed   . Headache   . History of alcohol abuse Stopped Fall 2012  . History of tobacco use Quit Fall 2012  . Hx of cardiovascular stress test    a. Lexiscan Myoview (10/15):  Small inferolateral and apical defect c/w scar and poss soft tissue attenuation, no ischemia, EF 42%  . Hx of colonic polyp 08/13/10   adenomatous  . Hx of colonoscopy   . Hyperlipidemia   . Hyperlipidemia   . Hypertension   . Hypertrophic cardiomyopathy ([place])    dx by Dr [name] 2009  . Iron deficiency anemia   . Myocardial infarction ([place])   . Panic attack   . Panic attacks   . Permanent atrial fibrillation ([place])   . Pneumonia     . Renal failure    baseline creatinine 1.6  . Right arm pain 01/08/2012  . RLS (restless legs syndrome)    Dx 06/2007  . Shortness of breath    sob on exertation  . Sleep apnea     Patient Active Problem List   Diagnosis Date Noted  . Moderate protein-calorie malnutrition ([place]) 06/02/2017  . Chronic idiopathic constipation 03/19/2017  . Loss of weight 01/26/2017  . 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 Permanent atrial fibrillation ([place])   . Laryngeal stenosis 09/18/2016  . Acute respiratory failure with hypoxia ([place])   . Dyspnea on exertion   . Polymyalgia rheumatica ([place]) 04/10/2015  . Low back pain 03/28/2015  . Hypertrophic cardiomyopathy ([place]) 02/15/2015  . Eczema 09/18/2014  . Environmental allergies 09/18/2014  . Pre-diabetes 11/26/2013  . COPD, moderate ([place]) 11/01/2013  . Cough 07/15/2013  . Preventative health care 08/18/2012  . Complete heart block ([place]) 04/03/2011  . Anemia 02/17/2011  . AVM (arteriovenous malformation) 02/13/2011  . PACEMAKER-St.Jude 11/28/2010  . CHRONIC KIDNEY DISEASE STAGE III (MODERATE) 09/24/2010  . Acute on chronic congestive heart failure ([place]) 08/30/2010  . ARTHRITIS 07/24/2010  . Generalized anxiety disorder 07/23/2010  . Hyperlipidemia 02/11/2007  . Essential hypertension 02/11/2007  . APNEA,  SLEEP 02/11/2007    Past Surgical History:  Procedure Laterality Date  . CARDIAC CATHETERIZATION    . CARDIAC CATHETERIZATION N/A 08/19/2016   Procedure: Right/Left Heart Cath and Coronary Angiography;  Surgeon: Jolaine Artist, MD;  Location: [facility];  Service: Cardiovascular;  Laterality: N/A;  . DIRECT LARYNGOSCOPY N/A 09/18/2016   Procedure: DIRECT LARYNGOSCOPY, BRONCHOSCOPY, REMOVAL OF INTUBATION GRANULOMA;  Surgeon: [name], MD;  Location: [facility] OR;  Service: ENT;  Laterality: N/A;  . DIRECT LARYNGOSCOPY N/A 10/20/2016   Procedure: EXTUBATION AND FLEXIBLE LARYNGOSCOPE;  Surgeon: [name], MD;  Location: [facility];  Service:  ENT;  Laterality: N/A;  . DIRECT LARYNGOSCOPY N/A 10/29/2016   Procedure: DIRECT LARYNGOSCOPY;  Surgeon: [name], MD;  Location: [facility] OR;  Service: ENT;  Laterality: N/A;  . ESOPHAGOGASTRODUODENOSCOPY  12/23/2011   Procedure: ESOPHAGOGASTRODUODENOSCOPY (EGD);  Surgeon: [name], MD;  Location: [facility];  Service: Endoscopy;  Laterality: N/A;  . EXTUBATION (ENDOTRACHEAL) IN OR N/A 07/21/2016   Procedure: EXTUBATION (ENDOTRACHEAL) IN OR;  Surgeon: [name], MD;  Location: [facility];  Service: ENT;  Laterality: N/A;  . GIVENS CAPSULE STUDY  12/23/2011   Procedure: GIVENS CAPSULE STUDY;  Surgeon: [name], MD;  Location: [facility];  Service: Endoscopy;  Laterality: N/A;  . MICROLARYNGOSCOPY WITH LASER N/A 10/16/2016   Procedure: MICRODIRECTLARYNGOSCOPY WITH LASER ABLATION AND KENLOG INJECTION;  Surgeon: [name], MD;  Location: [facility];  Service: ENT;  Laterality: N/A;  . PACEMAKER INSERTION  1998   st jude, most recent gen change by [name] 4/12  . TRACHEOSTOMY TUBE PLACEMENT N/A 10/29/2016   Procedure: TRACHEOSTOMY;  Surgeon: [name], MD;  Location: [facility];  Service: ENT;  Laterality: N/A;  . TUBAL LIGATION  04/01/2000    OB History    No data available       Home Medications    Prior to Admission medications   Medication Sig Start Date End Date Taking? Authorizing Provider  albuterol (PROVENTIL HFA;VENTOLIN HFA) 108 (90 Base) MCG/ACT inhaler Inhale 1-2 puffs into the lungs every 6 (six) hours as needed for wheezing or shortness of breath. 05/14/17   Domenic Moras, PA-C  ALPRAZolam Duanne Moron) 0.5 MG tablet Take 1 tablet (0.5 mg total) by mouth at bedtime as needed for anxiety. 01/26/17   Steve Rattler, DO  aspirin EC 81 MG tablet Take 81 mg by mouth every morning.     [provider]  bisacodyl (DULCOLAX) 10 MG suppository Place 10 mg rectally daily as needed for mild constipation.     [provider]  Cholecalciferol (VITAMIN D3) 5000 units CAPS Take  5,000 Units by mouth daily.    [provider]  ferrous sulfate 325 (65 FE) MG tablet TAKE 1 TABLET BY MOUTH TWICE DAILY WITH MEALS TO KEEP BLOOD COUNT UP Patient taking differently: Take 325 mg by mouth 3 (three) times daily. TO KEEP BLOOD COUNT UP 08/26/16   Lucila Maine C, DO  furosemide (LASIX) 40 MG tablet Take 1 tablet (40 mg total) by mouth daily. Take one tablet daily 01/28/17   Lucila Maine C, DO  metoprolol tartrate (LOPRESSOR) 25 MG tablet TAKE 1 TABLET BY MOUTH EVERY 6 HOURS AS NEEDED FOR HEART FLUTTERING 04/08/17   Allred, Jeneen Rinks, MD  nitroGLYCERIN (NITROSTAT) 0.4 MG SL tablet Place 1 tablet (0.4 mg total) under the tongue every 5 (five) minutes as needed for chest pain. 01/18/15   Eileen Stanford, PA-C  omeprazole (PRILOSEC) 20 MG capsule  Take 1 capsule (20 mg total) by mouth daily. Patient taking differently: Take 20 mg by mouth daily as needed (heartburn).  08/26/16   Steve Rattler, DO  polyethylene glycol (MIRALAX / GLYCOLAX) packet Take 17 g by mouth daily. 07/26/16   Domenic Polite, MD  potassium chloride (K-DUR) 10 MEQ tablet Take 40 mEq by mouth daily.    [provider]  rosuvastatin (CRESTOR) 20 MG tablet Take 20 mg by mouth daily.    [provider]  senna (SENOKOT) 8.6 MG TABS tablet Take 1 tablet (8.6 mg total) by mouth at bedtime. 03/19/17   Steve Rattler, DO    Family History Family History  Problem Relation Age of Onset  . Hypertension Mother   . Stroke Mother   . Heart disease Mother   . Aneurysm Mother   . CVA Mother   . Hypertension Father   . Stroke Father   . Heart attack Father   . Alcohol abuse Father   . Hypertension Sister   . Hypertension Brother   . Colon cancer Brother   . Cancer Brother   . Diabetes Sister   . Diabetes Brother   . Heart attack Brother   . Heart attack Brother   . Heart disease Sister   . Alcohol abuse Brother   . Heart disease Daughter     Social History Social History  Substance Use  Topics  . Smoking status: Former Smoker    Packs/day: 0.10    Years: 50.00    Types: Cigarettes    Quit date: 08/16/2011  . Smokeless tobacco: Never Used     Comment: quit 2012  . Alcohol use No     Comment: quit 08/16/2011     Allergies   Ace inhibitors; Other; and Propofol   Review of Systems Review of Systems  All other systems reviewed and are negative.    Physical Exam Updated Vital Signs BP (!) 148/81   Pulse 70   Temp 98.4 F (36.9 C) (Oral)   Resp (!) 22   Ht 5\' 4"  (1.626 m)   Wt 64.4 kg (142 lb)   SpO2 100%   BMI 24.37 kg/m   Physical Exam  Constitutional: She is oriented to person, place, and time. She appears well-developed.  HENT:  Head: Atraumatic.  Eyes: EOM are normal.  Neck: No JVD present.  Cardiovascular: Normal rate, regular rhythm and normal heart sounds.   Pulmonary/Chest: Effort normal. No respiratory distress. She has wheezes. She has no rales.  Slightly diminished on the R lower lung field. Pt has fine wheezing  Abdominal: Soft. She exhibits no distension. There is no tenderness.  Musculoskeletal: She exhibits no edema.  Neurological: She is alert and oriented to person, place, and time.  Skin: Skin is warm and dry.  Nursing note and vitals reviewed.    ED Treatments / Results  Labs (all labs ordered are listed, but only abnormal results are displayed) Labs Reviewed  CBC WITH DIFFERENTIAL/PLATELET  HEPATIC FUNCTION PANEL  PROTIME-INR  I-STAT CHEM 8, ED    EKG  EKG Interpretation  Date/Time:  Sunday June 14 2017 14:23:54 EDT Ventricular Rate:  70 PR Interval:    QRS Duration: 213 QT Interval:  519 QTC Calculation: 561 R Axis:   -87 Text Interpretation:  Sinus rhythm Borderline prolonged PR interval IVCD, consider atypical RBBB LVH with IVCD, LAD and secondary repol abnrm Prolonged QT interval No acute changes Confirmed by Varney Biles [phone]) on 06/14/2017 2:33:27 PM  Radiology Dg Chest Port 1 View  Result  Date: 06/14/2017 CLINICAL DATA:  Weakness for 1 month, history atrial fibrillation, diastolic heart failure EXAM: PORTABLE CHEST 1 VIEW COMPARISON:  Portable exam 1341 hours compared to 05/14/2017 FINDINGS: Tracheostomy tube stable. RIGHT subclavian sequential pacemaker with leads projecting over RIGHT atrium and RIGHT ventricle. Enlargement of cardiac silhouette with pulmonary vascular congestion. Elongation of thoracic aorta. Mild chronic peribronchial thickening without infiltrate, pleural effusion, or pneumothorax. Bones unremarkable. IMPRESSION: Enlargement of cardiac silhouette with pulmonary vascular congestion and pacemaker. Chronic bronchitic changes without infiltrate. Electronically Signed   By: Lavonia Dana M.D.   On: 06/14/2017 13:52    Procedures Procedures (including critical care time)  Medications Ordered in ED Medications  methylPREDNISolone sodium succinate (SOLU-MEDROL) 125 mg/2 mL injection 125 mg (not administered)  metoCLOPramide (REGLAN) injection 10 mg (not administered)  acetaminophen (TYLENOL) tablet 650 mg (not administered)  albuterol (PROVENTIL,VENTOLIN) solution continuous neb (10 mg/hr Nebulization Given 06/14/17 1418)  ipratropium (ATROVENT) nebulizer solution 0.5 mg (0.5 mg Nebulization Given 06/14/17 1418)     Initial Impression / Assessment and Plan / ED Course  I have reviewed the triage vital signs and the nursing notes.  Pertinent labs & imaging results that were available during my care of the patient were reviewed by me and considered in my medical decision making (see chart for details).    Pt comes in with multiple complaints. She primarily has dib, cough, and some orthopnea. Pt has CHF hx. Lungs had fine wheezing. No clear signs of overload, despite orthopnea like symptoms. Suspicion for PE is low. Pt is on trach and ventilating well on room air.  CXR ordered. R/o PNA. Nebs started. Interrogation of her device started to ensure the intermittent dyspnea  is not somehow related to intermittent dysrhythmia.    Final Clinical Impressions(s) / ED Diagnoses   Final diagnoses:  None    New Prescriptions New Prescriptions   No medications on file     Varney Biles, MD 06/14/17 1438

## 2017-06-14 NOTE — Discharge Instructions (Signed)
We suspect that your symptoms are related to heart failure. We ask you to double the lasix dose and see your primary doctor in 2 days.

## 2017-06-14 NOTE — ED Notes (Signed)
Ambulated pt around nurses station

## 2017-06-14 NOTE — ED Notes (Signed)
Placed pt on bedpan, tolerated well. 

## 2017-06-14 NOTE — ED Notes (Signed)
Attempted x 3 to interrogate st. Jude pacer, unsuccessful to transmit

## 2017-06-16 ENCOUNTER — Encounter (HOSPITAL_COMMUNITY)
Admission: RE | Disposition: A | Discharge status: Home / Self Care | Payer: Self-pay | Source: Ambulatory Visit | Attending: Gastroenterology

## 2017-06-16 ENCOUNTER — Encounter (HOSPITAL_COMMUNITY): Payer: Self-pay

## 2017-06-16 ENCOUNTER — Inpatient Hospital Stay: Payer: Medicare Other | Admitting: Student in an Organized Health Care Education/Training Program

## 2017-06-16 ENCOUNTER — Ambulatory Visit (HOSPITAL_COMMUNITY)
Admission: RE | Admit: 2017-06-16 | Discharge: 2017-06-16 | Disposition: A | Discharge status: Home / Self Care | Payer: Medicare Other | Source: Ambulatory Visit | Attending: Gastroenterology | Admitting: Gastroenterology

## 2017-06-16 ENCOUNTER — Ambulatory Visit (HOSPITAL_COMMUNITY): Payer: Medicare Other | Admitting: Anesthesiology

## 2017-06-16 DIAGNOSIS — D128 Benign neoplasm of rectum: Secondary | ICD-10-CM | POA: Diagnosis not present

## 2017-06-16 DIAGNOSIS — Z800 Family history of malignant neoplasm of digestive organs: Secondary | ICD-10-CM | POA: Diagnosis not present

## 2017-06-16 DIAGNOSIS — Z1211 Encounter for screening for malignant neoplasm of colon: Secondary | ICD-10-CM | POA: Diagnosis not present

## 2017-06-16 DIAGNOSIS — K219 Gastro-esophageal reflux disease without esophagitis: Secondary | ICD-10-CM | POA: Insufficient documentation

## 2017-06-16 DIAGNOSIS — Z79899 Other long term (current) drug therapy: Secondary | ICD-10-CM | POA: Diagnosis not present

## 2017-06-16 DIAGNOSIS — D122 Benign neoplasm of ascending colon: Secondary | ICD-10-CM | POA: Diagnosis not present

## 2017-06-16 DIAGNOSIS — K635 Polyp of colon: Secondary | ICD-10-CM

## 2017-06-16 DIAGNOSIS — I5032 Chronic diastolic (congestive) heart failure: Secondary | ICD-10-CM | POA: Diagnosis not present

## 2017-06-16 DIAGNOSIS — Z7982 Long term (current) use of aspirin: Secondary | ICD-10-CM | POA: Insufficient documentation

## 2017-06-16 DIAGNOSIS — D649 Anemia, unspecified: Secondary | ICD-10-CM | POA: Diagnosis not present

## 2017-06-16 DIAGNOSIS — Z86010 Personal history of colonic polyps: Secondary | ICD-10-CM | POA: Insufficient documentation

## 2017-06-16 DIAGNOSIS — I110 Hypertensive heart disease with heart failure: Secondary | ICD-10-CM | POA: Insufficient documentation

## 2017-06-16 DIAGNOSIS — D124 Benign neoplasm of descending colon: Secondary | ICD-10-CM | POA: Diagnosis not present

## 2017-06-16 DIAGNOSIS — K621 Rectal polyp: Secondary | ICD-10-CM | POA: Diagnosis not present

## 2017-06-16 DIAGNOSIS — R1310 Dysphagia, unspecified: Secondary | ICD-10-CM | POA: Insufficient documentation

## 2017-06-16 DIAGNOSIS — D123 Benign neoplasm of transverse colon: Secondary | ICD-10-CM | POA: Insufficient documentation

## 2017-06-16 DIAGNOSIS — D120 Benign neoplasm of cecum: Secondary | ICD-10-CM

## 2017-06-16 DIAGNOSIS — I864 Gastric varices: Secondary | ICD-10-CM

## 2017-06-16 HISTORY — PX: ESOPHAGOGASTRODUODENOSCOPY (EGD) WITH PROPOFOL: SHX5813

## 2017-06-16 HISTORY — PX: COLONOSCOPY WITH PROPOFOL: SHX5780

## 2017-06-16 SURGERY — ESOPHAGOGASTRODUODENOSCOPY (EGD) WITH PROPOFOL
Anesthesia: Monitor Anesthesia Care

## 2017-06-16 SURGERY — COLONOSCOPY WITH PROPOFOL
Anesthesia: Monitor Anesthesia Care

## 2017-06-16 MED ORDER — LIDOCAINE 2% (20 MG/ML) 5 ML SYRINGE
INTRAMUSCULAR | Status: DC | PRN
  Administered 2017-06-16: 50 mg via INTRAVENOUS

## 2017-06-16 MED ORDER — PROPOFOL 500 MG/50ML IV EMUL
INTRAVENOUS | Status: DC | PRN
  Administered 2017-06-16: 50 ug/kg/min via INTRAVENOUS

## 2017-06-16 MED ORDER — PROPOFOL 10 MG/ML IV BOLUS
INTRAVENOUS | Status: DC | PRN
  Administered 2017-06-16: 50 mg via INTRAVENOUS
  Administered 2017-06-16 (×2): 20 mg via INTRAVENOUS

## 2017-06-16 MED ORDER — PHENYLEPHRINE 40 MCG/ML (10ML) SYRINGE FOR IV PUSH (FOR BLOOD PRESSURE SUPPORT)
PREFILLED_SYRINGE | INTRAVENOUS | Status: DC | PRN
  Administered 2017-06-16 (×3): 80 ug via INTRAVENOUS

## 2017-06-16 MED ORDER — LIDOCAINE 2% (20 MG/ML) 5 ML SYRINGE
INTRAMUSCULAR | Status: AC
  Filled 2017-06-16: qty 5

## 2017-06-16 MED ORDER — GLUCAGON HCL RDNA (DIAGNOSTIC) 1 MG IJ SOLR
INTRAMUSCULAR | Status: DC | PRN
  Administered 2017-06-16: .5 mg via INTRAVENOUS

## 2017-06-16 MED ORDER — GLUCAGON HCL RDNA (DIAGNOSTIC) 1 MG IJ SOLR
INTRAMUSCULAR | Status: AC
  Filled 2017-06-16: qty 1

## 2017-06-16 MED ORDER — SODIUM CHLORIDE 0.9 % IV SOLN: INTRAVENOUS | Status: DC

## 2017-06-16 MED ORDER — LACTATED RINGERS IV SOLN
INTRAVENOUS | Status: DC
  Administered 2017-06-16: 1000 mL via INTRAVENOUS

## 2017-06-16 MED ORDER — PROPOFOL 10 MG/ML IV BOLUS
INTRAVENOUS | Status: AC
  Filled 2017-06-16: qty 40

## 2017-06-16 SURGICAL SUPPLY — 24 items

## 2017-06-16 NOTE — Op Note (Signed)
[facility] Patient Name: [name] Procedure Date: 06/16/2017 MRN: [id number] Attending MD: Mauri Pole , MD Date of Birth: 09/09/1946 CSN: [id number] Age: 71 Admit Type: Outpatient Procedure:                Colonoscopy Indications:              High risk colon cancer surveillance: Personal                            history of colonic polyps, Last colonoscopy: 2011 Providers:                Mauri Pole, MD, Burtis Junes, RN, Corliss Parish, Technician Referring MD:              Medicines:                Monitored Anesthesia Care Complications:            No immediate complications. Estimated Blood Loss:     Estimated blood loss was minimal. Procedure:                Pre-Anesthesia Assessment:                           - Prior to the procedure, a History and Physical                            was performed, and patient medications and                            allergies were reviewed. The patient's tolerance of                            previous anesthesia was also reviewed. The risks                            and benefits of the procedure and the sedation                            options and risks were discussed with the patient.                            All questions were answered, and informed consent                            was obtained. Prior Anticoagulants: The patient has                            taken no previous anticoagulant or antiplatelet                            agents. ASA Grade Assessment: III - A patient with  severe systemic disease. After reviewing the risks                            and benefits, the patient was deemed in                            satisfactory condition to undergo the procedure.                           After obtaining informed consent, the colonoscope                            was passed under direct vision. Throughout the                 procedure, the patient's blood pressure, pulse, and                            oxygen saturations were monitored continuously. The                            was introduced through the anus and advanced to the                            the cecum, identified by appendiceal orifice and                            ileocecal valve. The colonoscopy was performed                            without difficulty. The patient tolerated the                            procedure well. The quality of the bowel                            preparation was excellent. The ileocecal valve,                            appendiceal orifice, and rectum were photographed. Scope In: 9:50:12 AM Scope Out: 10:22:14 AM Scope Withdrawal Time: 0 hours 21 minutes 44 seconds  Total Procedure Duration: 0 hours 32 minutes 2 seconds  Findings:      The perianal and digital rectal examinations were normal.      Three sessile polyps were found in the rectum, transverse colon and       cecum. The polyps were 1 to 3 mm in size. These polyps were removed with       a cold biopsy forceps. Resection and retrieval were complete.      Four sessile polyps were found in the transverse colon, ascending colon       and cecum. The polyps were 4 to 8 mm in size. These polyps were removed       with a cold snare. Resection and retrieval were complete.      A 15 mm polyp was found in the descending colon. The polyp was  pedunculated. The polyp was removed with a hot snare. Resection and       retrieval were complete. Impression:               - Three 1 to 3 mm polyps in the rectum, in the                            transverse colon and in the cecum, removed with a                            cold biopsy forceps. Resected and retrieved.                           - Four 4 to 8 mm polyps in the transverse colon, in                            the ascending colon and in the cecum, removed with                            a cold  snare. Resected and retrieved.                           - One 15 mm polyp in the descending colon, removed                            with a hot snare. Resected and retrieved. Moderate Sedation:      N/A- Per Anesthesia Care Recommendation:           - Patient has a contact number available for                            emergencies. The signs and symptoms of potential                            delayed complications were discussed with the                            patient. Return to normal activities tomorrow.                            Written discharge instructions were provided to the                            patient.                           - Resume previous diet.                           - Continue present medications.                           - Await pathology results.                           -  Repeat colonoscopy in 3 years for surveillance                            based on pathology results.                           - No ibuprofen, naproxen, or other non-steroidal                            anti-inflammatory drugs for 2 weeks after polyp                            removal. Procedure Code(s):        --- Professional ---                           [phone], 22, Colonoscopy, flexible; with removal of                            tumor(s), polyp(s), or other lesion(s) by snare                            technique                           45380, 59, Colonoscopy, flexible; with biopsy,                            single or multiple Diagnosis Code(s):        --- Professional ---                           Z86.010, Personal history of colonic polyps                           K62.1, Rectal polyp                           D12.3, Benign neoplasm of transverse colon (hepatic                            flexure or splenic flexure)                           D12.2, Benign neoplasm of ascending colon                           D12.0, Benign neoplasm of cecum                            D12.4, Benign neoplasm of descending colon CPT copyright 2016 [employer]. All rights reserved. The codes documented in this report are preliminary and upon coder review may  be revised to meet current compliance requirements. Mauri Pole, MD 06/16/2017 10:30:46 AM This report has been signed electronically. Number of Addenda: 0

## 2017-06-16 NOTE — Transfer of Care (Signed)
Immediate Anesthesia Transfer of Care Note  Patient: [name]  Procedure(s) Performed: Procedure(s): COLONOSCOPY WITH PROPOFOL (N/A) ESOPHAGOGASTRODUODENOSCOPY (EGD) WITH PROPOFOL (N/A)  Patient Location: PACU  Anesthesia Type:MAC  Level of Consciousness: awake, alert  and oriented  Airway & Oxygen Therapy: Patient Spontanous Breathing and Patient connected to face mask oxygen  Post-op Assessment: Report given to RN and Post -op Vital signs reviewed and stable  Post vital signs: Reviewed and stable  Last Vitals:  Vitals:   06/16/17 0842  BP: (!) 145/85  Pulse: 70  Resp: 15  Temp: 36.6 C    Last Pain:  Vitals:   06/16/17 0842  TempSrc: Oral  PainSc: 0-No pain         Complications: No apparent anesthesia complications

## 2017-06-16 NOTE — Anesthesia Postprocedure Evaluation (Signed)
Anesthesia Post Note  Patient: Jenan Harkless-Polk  Procedure(s) Performed: Procedure(s) (LRB): COLONOSCOPY WITH PROPOFOL (N/A) ESOPHAGOGASTRODUODENOSCOPY (EGD) WITH PROPOFOL (N/A)     Patient location during evaluation: PACU Anesthesia Type: MAC Level of consciousness: awake and alert and oriented Pain management: pain level controlled Vital Signs Assessment: post-procedure vital signs reviewed and stable Respiratory status: spontaneous breathing, nonlabored ventilation and respiratory function stable Cardiovascular status: stable and blood pressure returned to baseline Postop Assessment: no signs of nausea or vomiting Anesthetic complications: no    Last Vitals:  Vitals:   06/16/17 1040 06/16/17 1050  BP: (!) 135/112 135/81  Pulse: 72 70  Resp: 20 19  Temp:      Last Pain:  Vitals:   06/16/17 1032  TempSrc: Oral  PainSc:                  Colten Desroches A.

## 2017-06-16 NOTE — Anesthesia Preprocedure Evaluation (Addendum)
Anesthesia Evaluation  Patient identified by MRN, date of birth, ID band Patient awake    Reviewed: Allergy & Precautions, NPO status , Patient's Chart, lab work & pertinent test results, reviewed documented beta blocker date and time   Airway Mallampati: Trach       Dental no notable dental hx. (+) Poor Dentition   Pulmonary shortness of breath, with exertion, at rest and lying, asthma , sleep apnea , pneumonia, resolved, COPD,  COPD inhaler, former smoker,    Pulmonary exam normal breath sounds clear to auscultation       Cardiovascular hypertension, Pt. on medications and Pt. on home beta blockers + angina with exertion + Past MI and +CHF  Normal cardiovascular exam+ dysrhythmias Atrial Fibrillation + pacemaker  Rhythm:Irregular Rate:Normal  Hypertrophic CM   Neuro/Psych  Headaches, Restless legs syndrome    GI/Hepatic GERD  Medicated and Controlled,(+)     substance abuse  alcohol use, Hx/o ETOH abuse- quit 2012   Endo/Other  Hyperlipidemia  Renal/GU Renal InsufficiencyRenal disease     Musculoskeletal  (+) Arthritis , Osteoarthritis,  DDD Lumbar spine Polymyalgia rheumatica   Abdominal   Peds  Hematology  (+) anemia , Hx/o Angioedema   Anesthesia Other Findings   Reproductive/Obstetrics                             Anesthesia Physical Anesthesia Plan  ASA: III  Anesthesia Plan: MAC   Post-op Pain Management:    Induction: Intravenous  PONV Risk Score and Plan: 2 and Propofol and Midazolam  Airway Management Planned: Natural Airway and Nasal Cannula  Additional Equipment:   Intra-op Plan:   Post-operative Plan:   Informed Consent: I have reviewed the patients History and Physical, chart, labs and discussed the procedure including the risks, benefits and alternatives for the proposed anesthesia with the patient or authorized representative who has indicated his/her  understanding and acceptance.     Plan Discussed with: Anesthesiologist, CRNA and Surgeon  Anesthesia Plan Comments: (Discussed with patient. She was told her allergy to propofol was arrhythmia. She has had propofol since then without problems.  I suspect she developed arrhythmia due to a drop in her afterload after propofol administration, which would be a side effect as opposed to a true allergy. Will attempt to use propofol and proceed with caution.)       Anesthesia Quick Evaluation

## 2017-06-16 NOTE — H&P (Addendum)
[facility] History and Physical   Primary Care Physician:  Steve Rattler, DO   Reason for Procedure:  Anemia, dysphagia, family h/o colon cancer, personal history of polyps Plan:    EGD and Colonoscopy The risks and benefits as well as alternatives of endoscopic procedure(s) have been discussed and reviewed. All questions answered. The patient agrees to proceed.     HPI: [name] is a 71 y.o. female here for evaluation of worsening anemia, intermittent dysphagia, surveillance colonoscopy with h/o polyps and family history of colon cancer. Denies any nausea, vomiting, abdominal pain, melena or bright red blood per rectum    Past Medical History:  Diagnosis Date  . Anemia   . Angina   . Angioedema    2/2 ACE  . Arteriovenous malformation of stomach   . Arthritis   . Asthma   . AVM (arteriovenous malformation) of colon    small intestine; stomach  . Blood transfusion   . CHF (congestive heart failure) ([place])   . Complete heart block ([place])    s/p PPM 1998  . Complication of anesthesia    Difficult airway; anaphylaxis and swelling with propofol  . DDD (degenerative disc disease)   . Depression   . Diastolic heart failure   . Fatty liver 07/26/10  . GERD (gastroesophageal reflux disease)   . GI bleed   . Headache   . History of alcohol abuse Stopped Fall 2012  . History of tobacco use Quit Fall 2012  . Hx of cardiovascular stress test    a. Lexiscan Myoview (10/15):  Small inferolateral and apical defect c/w scar and poss soft tissue attenuation, no ischemia, EF 42%  . Hx of colonic polyp 08/13/10   adenomatous  . Hx of colonoscopy   . Hyperlipidemia   . Hyperlipidemia   . Hypertension   . Hypertrophic cardiomyopathy ([place])    dx by Dr [name] 2009  . Iron deficiency anemia   . Myocardial infarction ([place])   . Panic attack   . Panic attacks   . Permanent atrial fibrillation ([place])   . Pneumonia   . Renal failure    baseline creatinine 1.6   . Right arm pain 01/08/2012  . RLS (restless legs syndrome)    Dx 06/2007  . Shortness of breath    sob on exertation  . Sleep apnea     Past Surgical History:  Procedure Laterality Date  . CARDIAC CATHETERIZATION    . CARDIAC CATHETERIZATION N/A 08/19/2016   Procedure: Right/Left Heart Cath and Coronary Angiography;  Surgeon: Jolaine Artist, MD;  Location: [facility];  Service: Cardiovascular;  Laterality: N/A;  . DIRECT LARYNGOSCOPY N/A 09/18/2016   Procedure: DIRECT LARYNGOSCOPY, BRONCHOSCOPY, REMOVAL OF INTUBATION GRANULOMA;  Surgeon: [name], MD;  Location: [facility] OR;  Service: ENT;  Laterality: N/A;  . DIRECT LARYNGOSCOPY N/A 10/20/2016   Procedure: EXTUBATION AND FLEXIBLE LARYNGOSCOPE;  Surgeon: [name], MD;  Location: [place];  Service: ENT;  Laterality: N/A;  . DIRECT LARYNGOSCOPY N/A 10/29/2016   Procedure: DIRECT LARYNGOSCOPY;  Surgeon: [name], MD;  Location: [facility] OR;  Service: ENT;  Laterality: N/A;  . ESOPHAGOGASTRODUODENOSCOPY  12/23/2011   Procedure: ESOPHAGOGASTRODUODENOSCOPY (EGD);  Surgeon: [name], MD;  Location: [facility];  Service: Endoscopy;  Laterality: N/A;  . EXTUBATION (ENDOTRACHEAL) IN OR N/A 07/21/2016   Procedure: EXTUBATION (ENDOTRACHEAL) IN OR;  Surgeon: [name], MD;  Location: [place];  Service: ENT;  Laterality: N/A;  . GIVENS CAPSULE STUDY  12/23/2011   Procedure: GIVENS CAPSULE STUDY;  Surgeon: [name], MD;  Location: [facility];  Service: Endoscopy;  Laterality: N/A;  . MICROLARYNGOSCOPY WITH LASER N/A 10/16/2016   Procedure: MICRODIRECTLARYNGOSCOPY WITH LASER ABLATION AND KENLOG INJECTION;  Surgeon: [name], MD;  Location: [place];  Service: ENT;  Laterality: N/A;  . PACEMAKER INSERTION  1998   st jude, most recent gen change by [name] 4/12  . TRACHEOSTOMY TUBE PLACEMENT N/A 10/29/2016   Procedure: TRACHEOSTOMY;  Surgeon: [name], MD;  Location: [place];  Service: ENT;  Laterality: N/A;  . TUBAL LIGATION   04/01/2000    Prior to Admission medications   Medication Sig Start Date End Date Taking? Authorizing Provider  albuterol (PROVENTIL HFA;VENTOLIN HFA) 108 (90 Base) MCG/ACT inhaler Inhale 1-2 puffs into the lungs every 6 (six) hours as needed for wheezing or shortness of breath. 05/14/17  Yes Domenic Moras, PA-C  albuterol (PROVENTIL) (2.5 MG/3ML) 0.083% nebulizer solution Take 2.5 mg by nebulization every 6 (six) hours as needed for wheezing or shortness of breath.   Yes [provider]  ALPRAZolam Duanne Moron) 0.5 MG tablet Take 1 tablet (0.5 mg total) by mouth at bedtime as needed for anxiety. Patient taking differently: Take 0.5 mg by mouth daily as needed for anxiety.  01/26/17  Yes Steve Rattler, DO  aspirin EC 81 MG tablet Take 81 mg by mouth daily.    Yes [provider]  bisacodyl (DULCOLAX) 10 MG suppository Place 10 mg rectally daily as needed for mild constipation.    Yes [provider]  Cholecalciferol (VITAMIN D3) 5000 units CAPS Take 5,000 Units by mouth daily.   Yes [provider]  ferrous sulfate 325 (65 FE) MG tablet TAKE 1 TABLET BY MOUTH TWICE DAILY WITH MEALS TO KEEP BLOOD COUNT UP Patient taking differently: Take 325 mg by mouth 3 (three) times daily. TO KEEP BLOOD COUNT UP 08/26/16  Yes Riccio, Angela C, DO  furosemide (LASIX) 40 MG tablet Take 1 tablet (40 mg total) by mouth daily. Take one tablet daily Patient taking differently: Take 40 mg by mouth 2 (two) times daily.  01/28/17  Yes Riccio, Angela C, DO  metoprolol tartrate (LOPRESSOR) 25 MG tablet TAKE 1 TABLET BY MOUTH EVERY 6 HOURS AS NEEDED FOR HEART FLUTTERING 04/08/17  Yes Allred, Jeneen Rinks, MD  nitroGLYCERIN (NITROSTAT) 0.4 MG SL tablet Place 1 tablet (0.4 mg total) under the tongue every 5 (five) minutes as needed for chest pain. 01/18/15  Yes Eileen Stanford, PA-C  omeprazole (PRILOSEC) 20 MG capsule Take 1 capsule (20 mg total) by mouth daily. Patient taking differently: Take 20 mg by  mouth daily as needed (heartburn).  08/26/16  Yes Riccio, Levada Dy C, DO  polyethylene glycol (MIRALAX / GLYCOLAX) packet Take 17 g by mouth daily. Patient taking differently: Take 17 g by mouth See admin instructions. Mix 1 capful (17 g) in a bottle of water and drink over the course of 2 days 07/26/16  Yes Domenic Polite, MD  potassium chloride (K-DUR) 10 MEQ tablet Take 10 mEq by mouth daily.    Yes [provider]  rosuvastatin (CRESTOR) 20 MG tablet Take 20 mg by mouth daily.   Yes [provider]  senna (SENOKOT) 8.6 MG TABS tablet Take 1 tablet (8.6 mg total) by mouth at bedtime. 03/19/17  Yes Steve Rattler, DO    Current Facility-Administered Medications  Medication Dose Route Frequency Provider Last Rate Last Dose  . 0.9 %  sodium chloride infusion   Intravenous Continuous Juell Radney V, MD      . 0.9 %  sodium chloride infusion   Intravenous Continuous Nimesh Riolo V, MD      . lactated ringers infusion   Intravenous Continuous Ozetta Flatley V, MD 125 mL/hr at 06/16/17 0900 1,000 mL at 06/16/17 0900    Allergies as of 05/01/2017 - Review Complete 04/16/2017  Allergen Reaction Noted  . Ace inhibitors Swelling 03/15/2008  . Other Anaphylaxis and Swelling 10/15/2016  . Propofol Anaphylaxis and Swelling 07/15/2016    Family History  Problem Relation Age of Onset  . Hypertension Mother   . Stroke Mother   . Heart disease Mother   . Aneurysm Mother   . CVA Mother   . Hypertension Father   . Stroke Father   . Heart attack Father   . Alcohol abuse Father   . Hypertension Sister   . Hypertension Brother   . Colon cancer Brother   . Cancer Brother   . Diabetes Sister   . Diabetes Brother   . Heart attack Brother   . Heart attack Brother   . Heart disease Sister   . Alcohol abuse Brother   . Heart disease Daughter     Social History   Social History  . Marital status: Divorced    Spouse name: N/A  . Number of children: 4  . Years of  education: 9   Occupational History  . disabled Unemployed  . previously- house cleaning    Social History Main Topics  . Smoking status: Former Smoker    Packs/day: 0.10    Years: 50.00    Types: Cigarettes    Quit date: 08/16/2011  . Smokeless tobacco: Never Used     Comment: quit 2012  . Alcohol use No     Comment: quit 08/16/2011  . Drug use: No  . Sexual activity: No   Other Topics Concern  . Not on file   Social History Narrative   On disability for heart failure/pacemaker.     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    4 grown children,  8 grandchildren.     Drinks alcohol a few times a week socially. At one time, the most she reports drinking is about 3 mixed drinks.   Lives with daughter [name] and son. Will be moving 04/2011 to different house because concerned children are taking advantage of her financial situation.    No illicit drugs.   Smokes few cigarettes daily, especially when stressed. Started smoking @ age 34. Quit January 2012 2/2 health but started smoking again 02/2011.       Health Care POA:    Emergency Contact: daughter, Verdene Lennert [phone]   End of Life Plan: gave pt AD info pamphlet   Who lives with you: no one- lives in section 8 housing   Any pets: none   Diet: Pt has a varied diet of protein, starch and vegetables   Exercise: Pt has no regular exercise routine.   Seatbelts: Pt reports wearing seatbelt when in vehicles.    Hobbies: dancing    Review of Systems:  All other review of systems negative except as mentioned in the HPI.  Physical Exam: Vital signs in last 24 hours: Temp:  [97.9 F (36.6 C)] 97.9 F (36.6 C) (07/03 0842) Pulse Rate:  [70] 70 (07/03 0842) Resp:  [15] 15 (07/03 0842) BP: (145)/(85) 145/85 (07/03 0842) SpO2:  [99 %] 99 % (07/03 0842)  Weight:  [142 lb (64.4 kg)] 142 lb (64.4 kg) (07/03 0842)   General:   Alert,  Well-developed, well-nourished, pleasant and cooperative in NAD Lungs:  Clear  throughout to auscultation.   Heart:  Regular rate and rhythm; no murmurs, clicks, rubs,  or gallops. Abdomen:  Soft, nontender and nondistended. Normal bowel sounds.   Neuro/Psych:  Alert and cooperative. Normal mood and affect. A and O x 3   @K .Denzil Magnuson, MD [phone] Mon-Fri 8a-5p [phone] after 5p, weekends, holidays 06/16/2017 9:10 AM@  Addendum Allergy to Propofol entered in error and was mis categorized. She has received in Oct 2017 without any anaphylaxis. Per chart review patient had  arrhythmia in August 2017 secondary to cardiomyopathy.  Deleted Propofol from allergy list

## 2017-06-16 NOTE — Op Note (Signed)
[facility] Patient Name: [name] Procedure Date: 06/16/2017 MRN: [id number] Attending MD: Mauri Pole , MD Date of Birth: January 18, 1946 CSN: [id number] Age: 71 Admit Type: Outpatient Procedure:                Upper GI endoscopy Indications:              Dysphagia Providers:                Mauri Pole, MD, Burtis Junes, RN, Corliss Parish, Technician Referring MD:              Medicines:                Monitored Anesthesia Care Complications:            No immediate complications. Estimated Blood Loss:     Estimated blood loss: none. Procedure:                Pre-Anesthesia Assessment:                           - Prior to the procedure, a History and Physical                            was performed, and patient medications and                            allergies were reviewed. The patient's tolerance of                            previous anesthesia was also reviewed. The risks                            and benefits of the procedure and the sedation                            options and risks were discussed with the patient.                            All questions were answered, and informed consent                            was obtained. Prior Anticoagulants: The patient has                            taken no previous anticoagulant or antiplatelet                            agents. ASA Grade Assessment: III - A patient with                            severe systemic disease. After reviewing the risks  and benefits, the patient was deemed in                            satisfactory condition to undergo the procedure.                           After obtaining informed consent, the endoscope was                            passed under direct vision. Throughout the                            procedure, the patient's blood pressure, pulse, and                            oxygen saturations  were monitored continuously. The                            [id number]-[id number] ([id number]) scope was introduced through the                            mouth, and advanced to the second part of duodenum.                            The upper GI endoscopy was accomplished without                            difficulty. The patient tolerated the procedure                            well. Scope In: Scope Out: Findings:      The esophagus was normal. No gross lesions or stricture      The stomach was normal.      The examined duodenum was normal. Impression:               - Normal esophagus.                           - Normal stomach.                           - Normal examined duodenum.                           - No specimens collected. Moderate Sedation:      N/A- Per Anesthesia Care Recommendation:           - Patient has a contact number available for                            emergencies. The signs and symptoms of potential                            delayed complications were discussed with the  patient. Return to normal activities tomorrow.                            Written discharge instructions were provided to the                            patient.                           - Resume previous diet.                           - Continue present medications.                           - No repeat upper endoscopy.                           - Return to GI office PRN. Procedure Code(s):        --- Professional ---                           [phone], Esophagogastroduodenoscopy, flexible,                            transoral; diagnostic, including collection of                            specimen(s) by brushing or washing, when performed                            (separate procedure) Diagnosis Code(s):        --- Professional ---                           R13.10, Dysphagia, unspecified CPT copyright 2016 American Medical Association. All rights reserved. The codes  documented in this report are preliminary and upon coder review may  be revised to meet current compliance requirements. Mauri Pole, MD 06/16/2017 10:32:25 AM This report has been signed electronically. Number of Addenda: 0

## 2017-06-18 ENCOUNTER — Encounter (HOSPITAL_COMMUNITY): Payer: Self-pay | Admitting: Gastroenterology

## 2017-06-18 ENCOUNTER — Encounter: Payer: Self-pay | Admitting: Gastroenterology

## 2017-06-26 ENCOUNTER — Telehealth: Payer: Self-pay

## 2017-06-26 ENCOUNTER — Encounter: Payer: Self-pay | Admitting: Family Medicine

## 2017-06-26 ENCOUNTER — Ambulatory Visit (HOSPITAL_COMMUNITY)
Admission: RE | Admit: 2017-06-26 | Discharge: 2017-06-26 | Disposition: A | Discharge status: Home / Self Care | Payer: Medicare Other | Source: Ambulatory Visit | Attending: Family Medicine | Admitting: Family Medicine

## 2017-06-26 ENCOUNTER — Ambulatory Visit (INDEPENDENT_AMBULATORY_CARE_PROVIDER_SITE_OTHER): Payer: Medicare Other | Admitting: Family Medicine

## 2017-06-26 VITALS — BP 102/61 | HR 75 | Temp 98.2°F | Wt 137.0 lb

## 2017-06-26 DIAGNOSIS — R52 Pain, unspecified: Secondary | ICD-10-CM

## 2017-06-26 DIAGNOSIS — R634 Abnormal weight loss: Secondary | ICD-10-CM | POA: Diagnosis not present

## 2017-06-26 DIAGNOSIS — R05 Cough: Secondary | ICD-10-CM | POA: Diagnosis not present

## 2017-06-26 DIAGNOSIS — R059 Cough, unspecified: Secondary | ICD-10-CM

## 2017-06-26 MED ORDER — ALBUTEROL SULFATE (2.5 MG/3ML) 0.083% IN NEBU: 2.5000 mg | INHALATION_SOLUTION | Freq: Four times a day (QID) | RESPIRATORY_TRACT | 2 refills | Status: DC | PRN

## 2017-06-26 NOTE — Patient Instructions (Addendum)
  Please go to [facility] to get your outpatient chest x-ray done, this has been ordered already.    Please stop taking the Crestor.   Continue taking 2 Tylenol every 4-6 hours.  I'll call you with the results of the chest x-ray.   If you have questions or concerns please do not hesitate to call at [phone].  Lucila Maine, DO PGY-2, [facility] 06/26/2017 9:11 AM

## 2017-06-26 NOTE — Telephone Encounter (Signed)
Pt was called regarding her albuterol. Pt advised her medication is not out of date, the MD misread the expiration date. Pt can pick up albuterol rx (on my desk.) Pt did not answer, a VM was left explaining this.

## 2017-06-26 NOTE — Assessment & Plan Note (Signed)
  Chronic problem exacerbated by trach and patient's concerns for secretions being a lower airway problem. Lungs clear to auscultation, afebrile, normal VS.   -obtain 2 view CXR -refilled albuterol nebulizer

## 2017-06-26 NOTE — Assessment & Plan Note (Signed)
  Secondary to poor PO intake. Weight stable today, follows with RD.  -samples given for ensure today -follow up with RD as scheduled

## 2017-06-26 NOTE — Assessment & Plan Note (Signed)
  Diffuse, patient recently restarted Crestor. Unclear if body aches are related but she endorsed they worsened after restarting statin.   -stop crestor today -recommended scheduled tylenol -return precautions given

## 2017-06-26 NOTE — Progress Notes (Signed)
    Subjective:    Patient ID: [name], female    DOB: June 19, 1946, 71 y.o.   MRN: [id number]   CC: here to follow up after colonoscopy, complaining of body aches and chills  Colonoscopy went well. Was told to follow up with PCP so she made an appointment. Path from colonoscopy benign adenomas.  Body aches Body aches have returned after restarting the crestor. She reports she has pain that is worst in her hands making it difficult to grip things. Pain worse in morning and improves throughout the day. Has taken tylenol occasionally with some relief.   Cough She is concerned she has pneumonia because she is coughing up mucous, this has been a chronic problem for her. She recently went to ED and chest imaging was negative. Endorses chills. No fevers.  Weight Feels she lost some weight due to colonoscopy prep. Has otherwise been doing well. Following with Dr. Jenne Campus.  Smoking status reviewed- former smoker  Review of Systems- see HPI   Objective:  BP 102/61   Pulse 75   Temp 98.2 F (36.8 C) (Oral)   Wt 137 lb (62.1 kg)   SpO2 99%   BMI 23.52 kg/m  Vitals and nursing note reviewed  General: well nourished, in no acute distress Cardiac: RRR, clear S1 and S2, no murmurs, rubs, or gallops Respiratory: clear to auscultation bilaterally, no increased work of breathing Extremities: no edema or cyanosis Skin: warm and dry, no rashes noted Neuro: alert and oriented, no focal deficits  Assessment & Plan:    Cough  Chronic problem exacerbated by trach and patient's concerns for secretions being a lower airway problem. Lungs clear to auscultation, afebrile, normal VS.   -obtain 2 view CXR -refilled albuterol nebulizer   Loss of weight  Secondary to poor PO intake. Weight stable today, follows with RD.  -samples given for ensure today -follow up with RD as scheduled  Body aches  Diffuse, patient recently restarted Crestor. Unclear if body aches are related but  she endorsed they worsened after restarting statin.   -stop crestor today -recommended scheduled tylenol -return precautions given    Return if symptoms worsen or fail to improve.   Lucila Maine, DO Family Medicine Resident PGY-2

## 2017-06-29 ENCOUNTER — Telehealth: Payer: Self-pay | Admitting: Gastroenterology

## 2017-06-29 NOTE — Telephone Encounter (Signed)
Unable to get phone call to go through at pts home number. Left message to call back on cell number.  Pt called with multiple complaints that were not GI related. States she aches all over, doesn't feel good, has a cough, and her arm is swelling. Discussed with pt that she should contact her PCP as these are not related to her procedure that she had on 06/16/17. Pt states she saw her PCP on 06/26/17 and nothing was found. Encouraged her to call her PCP again.

## 2017-06-30 ENCOUNTER — Ambulatory Visit (INDEPENDENT_AMBULATORY_CARE_PROVIDER_SITE_OTHER): Payer: Self-pay | Admitting: *Deleted

## 2017-06-30 ENCOUNTER — Telehealth: Payer: Self-pay | Admitting: Internal Medicine

## 2017-06-30 DIAGNOSIS — I4821 Permanent atrial fibrillation: Secondary | ICD-10-CM

## 2017-06-30 DIAGNOSIS — I482 Chronic atrial fibrillation: Secondary | ICD-10-CM

## 2017-06-30 DIAGNOSIS — Z950 Presence of cardiac pacemaker: Secondary | ICD-10-CM

## 2017-06-30 NOTE — Telephone Encounter (Signed)
New Message   1. Has your device fired? no  2. Is you device beeping? No  3. Are you experiencing draining or swelling at device site? no  4. Are you calling to see if we received your device transmission? no  5. Have you passed out? No  Per pt states she is having really bad pain where her device is located. Pt would ike to know if she needs to go to the hospital?

## 2017-06-30 NOTE — Progress Notes (Signed)
Patient seen today with c/o of pain/aching at device site. Upon assessment patient free of redness, swelling or drainage. Incision was not hot to the touch.Patient reports chills. Temperature take today at 98.53F. Upon talking to patient and son patient reports that she recently saw her PCP who took her off crestor d/t generalized aching. She continues to report pain not only at incision but shoulder, hands and legs. Her Crestor was d/cd's Friday. GT assessed and encouraged patient to take pain medication as tolerated. Patient advised to return to PCP if aching does not resolve with discontinuation of crestor. Patient verbalized understanding.

## 2017-06-30 NOTE — Telephone Encounter (Signed)
Spoke with patient who reports that she has generalized body "soreness" for " a while". She states she went to her primary care where the d/c'd her crestor. She reports that today she woke up with significant pain at her pacemaker. She denies fever but reports that she has been having chills. Appointment for the DC made for today at 0930. Patient verbalized agreement. Spoke with son and confirmed appointment time.

## 2017-07-04 DIAGNOSIS — Z930 Tracheostomy status: Secondary | ICD-10-CM | POA: Diagnosis not present

## 2017-07-04 DIAGNOSIS — J449 Chronic obstructive pulmonary disease, unspecified: Secondary | ICD-10-CM | POA: Diagnosis not present

## 2017-07-09 ENCOUNTER — Encounter: Payer: Self-pay | Admitting: Family Medicine

## 2017-07-09 ENCOUNTER — Ambulatory Visit (INDEPENDENT_AMBULATORY_CARE_PROVIDER_SITE_OTHER): Payer: Medicare Other | Admitting: Family Medicine

## 2017-07-09 DIAGNOSIS — N183 Chronic kidney disease, stage 3 unspecified: Secondary | ICD-10-CM

## 2017-07-09 DIAGNOSIS — R634 Abnormal weight loss: Secondary | ICD-10-CM

## 2017-07-09 NOTE — Progress Notes (Signed)
Medical Nutrition Therapy:  Appt start time: 1430 end time:  2774. Gastroenterologist: Leta Jungling, MD PCP: Lucila Maine, DO  NOTE: Poor reading level; daughter helps her with this.   Assessment:  Primary concerns today: CKD dietary management, BG control, and unintentional weight loss (N18.3, E11.8; R63.4).  Weight is down 8 lb since appt 5 1/2 wks ago.   Ms. Carstens was accompanied by her daughter Daiva Eves and great-granddaughter Bernerd Limbo today.  She said she eats pretty well in afternoons, but mornings are difficult both b/c of poor appetite and discomfort.  She did get some store brand nutritional drinks, which she has been having 1-2 X day.     I have scheduled Nutrition follow-up for Ms. Legate-Polk for 7 weeks out, concerned that she will not be able to institute many nutritional recommendations until her pain is significantly lessened.  She was clearly uncomfortable today, and struggled when she had to move.  She will schedule an appt with Dr. [name] before leaving today to follow up on this.    Learning Readiness: Ready  Recent physical activity includes none b/c of pain in shoulders, legs, and hips, pretty much all over.  Also has swelling in hands and small bumps on hands and legs that started a couple weeks ago.   24-hr recall:  (Up at 10 AM) B (10 AM)-  1 Ensure Snk ( AM)-  --- L (12 PM)-  1 thin chx sandwich, 1/2 mayo, 1 c fruit salad, water Snk ( PM)-  2 glazed donuts, water D (5:30 PM)-  Frozen dinner: chicken, potatoes/mushrms, string beans, gravy, water  Snk ( PM)-  --- Typical day? No. Not feeling good yesterday; just wanted to sleep, and not interested in eating.    Progress Towards Goal(s):  In progress.   Nutritional Diagnosis:  NI-1.4 Inadequate energy intake As related to energy requirements.  As evidenced by poor appetite and weight loss of 8 lb in less than 6 weeks.    Intervention:  Nutrition education  Handouts given during visit include:  AVS,  large print  1 large-print copy of goals to post on refrigerator  Demonstrated degree of understanding via:  Teach Back (asked for examples of how she can add condiments to foods) Barriers to learning/adherence to lifestyle change: Poor appetite; pain.  Monitoring/Evaluation:  Dietary intake, exercise, and body weight in 5 week(s).

## 2017-07-09 NOTE — Patient Instructions (Addendum)
-   Make an appt with Dr. [name] to ask about your shoulders, hips, and thighs.    Goals: - Continue to have a full Ensure for breakfast, and whenever possible, add a boiled egg and a piece of fruit.  Adding some mayonnaise to your egg would be good for you.    - Lunch: Sandwich with meat and cheese OR peanut butter sandwich.  Also include some fruit and/or juice.    - Snacks: Choose fruit juice at least once a day instead of water all the time.    - Have an afternoon snack and a bedtime snack EVERY DAY.   - Dinner: Make sure you have   - Meat or fish or beans   - Starchy food (bread, potatoes, rice, pasta, corn, etc.)  - Vegetable or fruit or both  - Adding calories: Add butter, olive oil, salad dressing (regular, not light), mayonnaise, cheese whenever possible.  At every meal, before you eat, ask yourself, "What of these add-ons can I include to increase the calories?"  Call Dr. Jenne Campus if your Nutrition appt on Sept 10 does not work in your schedule:  [phone].

## 2017-07-16 ENCOUNTER — Encounter: Payer: Self-pay | Admitting: Family Medicine

## 2017-07-16 ENCOUNTER — Ambulatory Visit (INDEPENDENT_AMBULATORY_CARE_PROVIDER_SITE_OTHER): Payer: Medicare Other | Admitting: Family Medicine

## 2017-07-16 VITALS — BP 100/58 | HR 76 | Temp 97.7°F | Ht 64.0 in | Wt 135.0 lb

## 2017-07-16 DIAGNOSIS — M549 Dorsalgia, unspecified: Secondary | ICD-10-CM

## 2017-07-16 DIAGNOSIS — M353 Polymyalgia rheumatica: Secondary | ICD-10-CM | POA: Diagnosis not present

## 2017-07-16 DIAGNOSIS — R52 Pain, unspecified: Secondary | ICD-10-CM | POA: Diagnosis not present

## 2017-07-16 DIAGNOSIS — E7800 Pure hypercholesterolemia, unspecified: Secondary | ICD-10-CM | POA: Diagnosis not present

## 2017-07-16 LAB — POCT URINALYSIS DIP (MANUAL ENTRY)
BILIRUBIN UA: NEGATIVE
BILIRUBIN UA: NEGATIVE mg/dL
GLUCOSE UA: NEGATIVE mg/dL
Leukocytes, UA: NEGATIVE
NITRITE UA: NEGATIVE
PH UA: 5.5 (ref 5.0–8.0)
RBC UA: NEGATIVE
SPEC GRAV UA: 1.01 (ref 1.010–1.025)
Urobilinogen, UA: 0.2 E.U./dL

## 2017-07-16 MED ORDER — PREDNISONE 10 MG PO TABS: 10.0000 mg | ORAL_TABLET | Freq: Every day | ORAL | 0 refills | Status: DC

## 2017-07-16 NOTE — Assessment & Plan Note (Addendum)
  Last flare in 2016, resolved with several month course of 10 mg prednisone daily  -rx given for 10 mg prednisone to take daily -asked patient to call me in 1 week to see if steroid is working, if not will increase dose to 20 mg daily.  -will check ESR, CRP today and again at 2 week follow up  -refer to rheumatology based on labs

## 2017-07-16 NOTE — Assessment & Plan Note (Signed)
  Could not tolerate statin due to body aches  -check lipid panel today

## 2017-07-16 NOTE — Assessment & Plan Note (Signed)
  Likely secondary to PMR, see plan above  -additionally will check CMP today to r/o any electrolyte abnormalities -follow up 2 weeks

## 2017-07-16 NOTE — Progress Notes (Deleted)
PCP: Steve Rattler, DO Primary Cardiologist:  Dr Oval Linsey Electrophysiologist: [place]  [name] is a 71 y.o. female who presents today for electrophysiology followup.   She is seen today for routine EP follow up. She continues to have slow recovery from hospitalization. She has finished steroid taper and remains with hoarseness and a feeling that she can't cough up phlegm. Today, she denies symptoms of CP, lower extremity edema, dizziness, presyncope, or syncope.  The patient is otherwise without complaint today.   Device History: STJ dual chamber PPM implanted 1986 for CHB; gen change 1998, gen change 2004; gen change 2012  Past Medical History:  Diagnosis Date  . Anemia   . Angina   . Angioedema    2/2 ACE  . Arteriovenous malformation of stomach   . Arthritis   . Asthma   . AVM (arteriovenous malformation) of colon    small intestine; stomach  . Blood transfusion   . CHF (congestive heart failure) ([place])   . Complete heart block ([place])    s/p PPM 1998  . Complication of anesthesia    Difficult airway; anaphylaxis and swelling with propofol  . DDD (degenerative disc disease)   . Depression   . Diastolic heart failure   . Fatty liver 07/26/10  . GERD (gastroesophageal reflux disease)   . GI bleed   . Headache   . History of alcohol abuse Stopped Fall 2012  . History of tobacco use Quit Fall 2012  . Hx of cardiovascular stress test    a. Lexiscan Myoview (10/15):  Small inferolateral and apical defect c/w scar and poss soft tissue attenuation, no ischemia, EF 42%  . Hx of colonic polyp 08/13/10   adenomatous  . Hx of colonoscopy   . Hyperlipidemia   . Hyperlipidemia   . Hypertension   . Hypertrophic cardiomyopathy ([place])    dx by Dr [name] 2009  . Iron deficiency anemia   . Myocardial infarction ([place])   . Panic attack   . Panic attacks   . Permanent atrial fibrillation ([place])   . Pneumonia   . Renal failure    baseline creatinine 1.6  . Right arm  pain 01/08/2012  . RLS (restless legs syndrome)    Dx 06/2007  . Shortness of breath    sob on exertation  . Sleep apnea    Past Surgical History:  Procedure Laterality Date  . CARDIAC CATHETERIZATION    . CARDIAC CATHETERIZATION N/A 08/19/2016   Procedure: Right/Left Heart Cath and Coronary Angiography;  Surgeon: Jolaine Artist, MD;  Location: [facility];  Service: Cardiovascular;  Laterality: N/A;  . COLONOSCOPY WITH PROPOFOL N/A 06/16/2017   Procedure: COLONOSCOPY WITH PROPOFOL;  Surgeon: Mauri Pole, MD;  Location: [facility];  Service: Endoscopy;  Laterality: N/A;  . DIRECT LARYNGOSCOPY N/A 09/18/2016   Procedure: DIRECT LARYNGOSCOPY, BRONCHOSCOPY, REMOVAL OF INTUBATION GRANULOMA;  Surgeon: [name], MD;  Location[facility];  Service: ENT;  Laterality: N/A;  . DIRECT LARYNGOSCOPY N/A 10/20/2016   Procedure: EXTUBATION AND FLEXIBLE LARYNGOSCOPE;  Surgeon: [name], MD;  Location: [place];  Service: ENT;  Laterality: N/A;  . DIRECT LARYNGOSCOPY N/A 10/29/2016   Procedure: DIRECT LARYNGOSCOPY;  Surgeon: [name], MD;  Location: [facility] OR;  Service: ENT;  Laterality: N/A;  . ESOPHAGOGASTRODUODENOSCOPY  12/23/2011   Procedure: ESOPHAGOGASTRODUODENOSCOPY (EGD);  Surgeon: [name], MD;  Location: [facility];  Service: Endoscopy;  Laterality: N/A;  . ESOPHAGOGASTRODUODENOSCOPY (EGD) WITH PROPOFOL N/A 06/16/2017  Procedure: ESOPHAGOGASTRODUODENOSCOPY (EGD) WITH PROPOFOL;  Surgeon: Mauri Pole, MD;  Location: [facility];  Service: Endoscopy;  Laterality: N/A;  . EXTUBATION (ENDOTRACHEAL) IN OR N/A 07/21/2016   Procedure: EXTUBATION (ENDOTRACHEAL) IN OR;  Surgeon: [name], MD;  Location: [place];  Service: ENT;  Laterality: N/A;  . GIVENS CAPSULE STUDY  12/23/2011   Procedure: GIVENS CAPSULE STUDY;  Surgeon: [name], MD;  Location: [facility];  Service: Endoscopy;  Laterality: N/A;  . MICROLARYNGOSCOPY WITH LASER N/A 10/16/2016   Procedure:  MICRODIRECTLARYNGOSCOPY WITH LASER ABLATION AND KENLOG INJECTION;  Surgeon: [name], MD;  Location: [place];  Service: ENT;  Laterality: N/A;  . PACEMAKER INSERTION  1998   st jude, most recent gen change by [name] 4/12  . TRACHEOSTOMY TUBE PLACEMENT N/A 10/29/2016   Procedure: TRACHEOSTOMY;  Surgeon: [name], MD;  Location: [facility];  Service: ENT;  Laterality: N/A;  . TUBAL LIGATION  04/01/2000    Current Outpatient Prescriptions  Medication Sig Dispense Refill  . acetaminophen (TYLENOL) 325 MG tablet Take 325 mg by mouth every 6 (six) hours as needed.    [name] albuterol (PROVENTIL HFA;VENTOLIN HFA) 108 (90 Base) MCG/ACT inhaler Inhale 1-2 puffs into the lungs every 6 (six) hours as needed for wheezing or shortness of breath. (Patient not taking: Reported on 07/09/2017) 1 Inhaler 0  . albuterol (PROVENTIL) (2.5 MG/3ML) 0.083% nebulizer solution Take 3 mLs (2.5 mg total) by nebulization every 6 (six) hours as needed for wheezing or shortness of breath. (Patient not taking: Reported on 07/09/2017) 75 mL 2  . ALPRAZolam (XANAX) 0.5 MG tablet Take 1 tablet (0.5 mg total) by mouth at bedtime as needed for anxiety. (Patient not taking: Reported on 07/09/2017) 15 tablet 2  . aspirin EC 81 MG tablet Take 81 mg by mouth daily.     . bisacodyl (DULCOLAX) 10 MG suppository Place 10 mg rectally daily as needed for mild constipation.     . Cholecalciferol (VITAMIN D3) 5000 units CAPS Take 5,000 Units by mouth daily.    . ferrous sulfate 325 (65 FE) MG tablet TAKE 1 TABLET BY MOUTH TWICE DAILY WITH MEALS TO KEEP BLOOD COUNT UP (Patient taking differently: Take 325 mg by mouth 3 (three) times daily. TO KEEP BLOOD COUNT UP) 60 tablet 11  . furosemide (LASIX) 40 MG tablet Take 1 tablet (40 mg total) by mouth daily. Take one tablet daily (Patient taking differently: Take 40 mg by mouth 2 (two) times daily. ) 90 tablet 3  . ibuprofen (ADVIL,MOTRIN) 200 MG tablet Take 200 mg by mouth every 6 (six) hours as needed.      . metoprolol tartrate (LOPRESSOR) 25 MG tablet TAKE 1 TABLET BY MOUTH EVERY 6 HOURS AS NEEDED FOR HEART FLUTTERING 360 tablet 3  . nitroGLYCERIN (NITROSTAT) 0.4 MG SL tablet Place 1 tablet (0.4 mg total) under the tongue every 5 (five) minutes as needed for chest pain. 25 tablet 1  . omeprazole (PRILOSEC) 20 MG capsule Take 1 capsule (20 mg total) by mouth daily. (Patient not taking: Reported on 07/09/2017) 30 capsule 6  . polyethylene glycol (MIRALAX / GLYCOLAX) packet Take 17 g by mouth daily. (Patient taking differently: Take 17 g by mouth See admin instructions. Mix 1 capful (17 g) in a bottle of water and drink over the course of 2 days) 14 each 0  . potassium chloride (K-DUR) 10 MEQ tablet Take 10 mEq by mouth daily.     . predniSONE (DELTASONE) 10 MG tablet  Take 1 tablet (10 mg total) by mouth daily with breakfast. 30 tablet 0  . senna (SENOKOT) 8.6 MG TABS tablet Take 1 tablet (8.6 mg total) by mouth at bedtime. 30 each 2   No current facility-administered medications for this visit.    ROS- all systems are reviewed and negative except as per HPI above  Physical Exam: There were no vitals filed for this visit.  GEN- The patient is well appearing, alert and oriented x 3 today.   Head- normocephalic, atraumatic Eyes-  Sclera clear, conjunctiva pink Ears- hearing intact Oropharynx- clear Neck- no stridor Lungs- Clear to ausculation bilaterally, normal work of breathing Chest- pacemaker pocket is well healed Heart- Regular rate and rhythm, (paced) GI- soft, NT, ND, + BS Extremities- no clubbing, cyanosis, or edema  Pacemaker interrogation- reviewed in detail today,  See PACEART report  Assessment and Plan:  1. Complete heart block Recent normal pacemaker function See PaceArt report No changes today  2. Permanent afib Not a candidate for long term anticoagulation due to prior GI bleeding or for watchman given recent difficulty with screening TEE.  I would favor a conservative  approach going forward.  3. HTN Stable No change required today  4. Atypical chest pain (chronic) Prior cath revealed normal cors  5. Chronic systolic dysfunction  Euvolemic on exam Continue current therapy Follow up Dr Oval Linsey as scheduled   6. Chronic blood loss anemia Recent CBC stable Not a candidate for long term anticoagulation   Follow-up with Dr Oval Linsey 3 months, Dr Rayann Heman 6 months  Chanetta Marshall, NP  07/16/2017 8:34 PM

## 2017-07-16 NOTE — Patient Instructions (Addendum)
Please take 10 mg of Prednisone daily. Call me in 1 week to let me know if you're still having pain. If you are still having pain we will increase it to 20 mg daily (2 pills).  I'd like to see you back in 2 weeks to check your labs again.   Polymyalgia Rheumatica Polymyalgia rheumatica (PMR) is an inflammatory disorder that causes aching and stiffness in your muscles and joints. Sometimes, PMR leads to a more dangerous condition (temporal arteritis or giant cell arteritis), which can cause vision loss. What are the causes? The exact cause of PMR is not known. What increases the risk? This condition is more likely to develop in:  Females.  People who are 73 years of age or older.  Caucasians.  What are the signs or symptoms?  Pain and stiffness are the main symptoms of PMR. Symptoms may start slowly or suddenly. The symptoms:  May be worse after inactivity and in the morning.  May affect your: ? Hips, buttocks, and thighs. ? Neck, arms, and shoulders. This can make it hard to raise your arms above your head. ? Hands and wrists.  Other symptoms include:  Fever.  Tiredness.  Weakness.  Decreased appetite. This may lead to weight loss.  How is this diagnosed? This condition is diagnosed with a medical history and physical exam. You may need to see a health care provider who specializes in diseases of the joint, muscles, and bones (rheumatologist). You may also have tests, including:  Blood tests.  X-rays.  How is this treated? PMR usually goes away without treatment, but it may take years for that to happen. In the meantime, your health care provider may recommend low-dose steroids to help manage your symptoms of pain and stiffness. Regular exercise and rest will also help your symptoms. Follow these instructions at home:  Take over-the-counter and prescription medicines only as told by your health care provider.  Make sure to get enough rest and sleep.  Eat a  healthy and nutritious diet.  Try to exercise most days of the week. Ask your health care provider what type of exercise is best for you.  Keep all follow-up visits as told by your health are provider. This is important. Contact a health care provider if:  Your symptoms are not controlled with medicine.  You have side effects from steroids. These may include: ? Weight gain. ? Swelling. ? Insomnia. ? Mood changes. ? Bruising. ? High blood sugar readings, if you have diabetes. ? Higher than normal blood pressure readings, if you monitor your blood pressure. Get help right away if:  You develop symptoms of temporal arteritis, such as: ? A change in vision. ? Severe headache. ? Scalp pain. ? Jaw pain. This information is not intended to replace advice given to you by your health care provider. Make sure you discuss any questions you have with your health care provider. Document Released: 01/08/2005 Document Revised: 05/08/2016 Document Reviewed: 06/13/2015 Elsevier Interactive Patient Education  [employer].

## 2017-07-16 NOTE — Progress Notes (Signed)
    Subjective:    Patient ID: [name], female    DOB: [birth year], 71 y.o.   MRN: [id number]   CC: muscle pains all over  Has continued muscle pains, no longer taking Crestor. Has a new rash on her LE. She is in a lot of pain especially in shoulders or hips. She does not remember this happening before. She denies fevers or chills.  Smoking status reviewed- former smoker  Review of Systems- no CP, SOB. Endorses fatigue. Denies falls.   Objective:  BP (!) 100/58   Pulse 76   Temp 97.7 F (36.5 C) (Oral)   Ht '5\' 4"'$  (1.626 m)   Wt 135 lb (61.2 kg)   SpO2 98%   BMI 23.17 kg/m  Vitals and nursing note reviewed  General: well nourished, in no acute distress, trach in place Cardiac: RRR, clear S1 and S2, no murmurs, rubs, or gallops Respiratory: clear to auscultation bilaterally, no increased work of breathing MSK: tender to palpation of back diffusely, strength 5/5 in upper and lower extremities bilaterally  Skin: warm and dry, diffuse maculopapular rash over LE, no erythema or warmth over rash, no discharge Neuro: alert and oriented, no focal deficits   Assessment & Plan:    Polymyalgia rheumatica (HCC)  Last flare in 2016, resolved with several month course of 10 mg prednisone daily  -rx given for 10 mg prednisone to take daily -asked patient to call me in 1 week to see if steroid is working, if not will increase dose to 20 mg daily.  -will check ESR, CRP today and again at 2 week follow up  -refer to rheumatology based on labs  Hyperlipidemia  Could not tolerate statin due to body aches  -check lipid panel today  Body aches  Likely secondary to PMR, see plan above  -additionally will check CMP today to r/o any electrolyte abnormalities -follow up 2 weeks    Return in about 2 weeks (around 07/30/2017).   Lucila Maine, DO Family Medicine Resident PGY-2

## 2017-07-17 ENCOUNTER — Other Ambulatory Visit: Payer: Self-pay | Admitting: Family Medicine

## 2017-07-17 ENCOUNTER — Encounter: Payer: Medicare Other | Admitting: Nurse Practitioner

## 2017-07-17 DIAGNOSIS — M353 Polymyalgia rheumatica: Secondary | ICD-10-CM

## 2017-07-17 LAB — CMP14+EGFR
ALT: 9 IU/L (ref 0–32)
AST: 20 IU/L (ref 0–40)
Albumin/Globulin Ratio: 1.2 (ref 1.2–2.2)
Albumin: 4.1 g/dL (ref 3.5–4.8)
Alkaline Phosphatase: 60 IU/L (ref 39–117)
BUN / CREAT RATIO: 40 — AB (ref 12–28)
BUN: 72 mg/dL — ABNORMAL HIGH (ref 8–27)
Bilirubin Total: 0.3 mg/dL (ref 0.0–1.2)
CO2: 22 mmol/L (ref 20–29)
CREATININE: 1.79 mg/dL — AB (ref 0.57–1.00)
Calcium: 10 mg/dL (ref 8.7–10.3)
Chloride: 93 mmol/L — ABNORMAL LOW (ref 96–106)
GFR calc Af Amer: 32 mL/min/{1.73_m2} — ABNORMAL LOW (ref >59)
GFR, EST NON AFRICAN AMERICAN: 28 mL/min/{1.73_m2} — AB (ref >59)
GLUCOSE: 101 mg/dL — AB (ref 65–99)
Globulin, Total: 3.3 g/dL (ref 1.5–4.5)
Potassium: 4.9 mmol/L (ref 3.5–5.2)
SODIUM: 132 mmol/L — AB (ref 134–144)
TOTAL PROTEIN: 7.4 g/dL (ref 6.0–8.5)

## 2017-07-17 LAB — LIPID PANEL
Chol/HDL Ratio: 3.5 ratio (ref 0.0–4.4)
Cholesterol, Total: 168 mg/dL (ref 100–199)
HDL: 48 mg/dL (ref >39)
LDL CALC: 99 mg/dL (ref 0–99)
Triglycerides: 103 mg/dL (ref 0–149)
VLDL Cholesterol Cal: 21 mg/dL (ref 5–40)

## 2017-07-17 LAB — SEDIMENTATION RATE: Sed Rate: 91 mm/hr — ABNORMAL HIGH (ref 0–40)

## 2017-07-17 LAB — C-REACTIVE PROTEIN: CRP: 52.4 mg/L — AB (ref 0.0–4.9)

## 2017-07-17 NOTE — Progress Notes (Signed)
Referral made to rheumatology for polymalgia rheumatica.  Lucila Maine, DO PGY-2, [facility] 07/17/2017 1:45 PM

## 2017-07-19 ENCOUNTER — Encounter (HOSPITAL_COMMUNITY): Payer: Self-pay

## 2017-07-19 ENCOUNTER — Emergency Department (HOSPITAL_COMMUNITY)
Admission: EM | Admit: 2017-07-19 | Discharge: 2017-07-19 | Disposition: A | Discharge status: Home / Self Care | Payer: Medicare Other | Attending: Emergency Medicine | Admitting: Emergency Medicine

## 2017-07-19 ENCOUNTER — Emergency Department (HOSPITAL_COMMUNITY): Payer: Medicare Other

## 2017-07-19 DIAGNOSIS — Z87891 Personal history of nicotine dependence: Secondary | ICD-10-CM | POA: Diagnosis not present

## 2017-07-19 DIAGNOSIS — Z79899 Other long term (current) drug therapy: Secondary | ICD-10-CM | POA: Diagnosis not present

## 2017-07-19 DIAGNOSIS — M791 Myalgia: Secondary | ICD-10-CM | POA: Insufficient documentation

## 2017-07-19 DIAGNOSIS — R0602 Shortness of breath: Secondary | ICD-10-CM | POA: Diagnosis not present

## 2017-07-19 DIAGNOSIS — R079 Chest pain, unspecified: Secondary | ICD-10-CM | POA: Diagnosis present

## 2017-07-19 DIAGNOSIS — R1013 Epigastric pain: Secondary | ICD-10-CM | POA: Diagnosis not present

## 2017-07-19 DIAGNOSIS — N281 Cyst of kidney, acquired: Secondary | ICD-10-CM | POA: Diagnosis not present

## 2017-07-19 DIAGNOSIS — R1084 Generalized abdominal pain: Secondary | ICD-10-CM | POA: Diagnosis not present

## 2017-07-19 DIAGNOSIS — J45909 Unspecified asthma, uncomplicated: Secondary | ICD-10-CM | POA: Diagnosis not present

## 2017-07-19 DIAGNOSIS — R112 Nausea with vomiting, unspecified: Secondary | ICD-10-CM | POA: Insufficient documentation

## 2017-07-19 DIAGNOSIS — Z950 Presence of cardiac pacemaker: Secondary | ICD-10-CM | POA: Insufficient documentation

## 2017-07-19 DIAGNOSIS — N183 Chronic kidney disease, stage 3 (moderate): Secondary | ICD-10-CM | POA: Insufficient documentation

## 2017-07-19 DIAGNOSIS — R1030 Lower abdominal pain, unspecified: Secondary | ICD-10-CM | POA: Diagnosis not present

## 2017-07-19 DIAGNOSIS — K5900 Constipation, unspecified: Secondary | ICD-10-CM | POA: Diagnosis not present

## 2017-07-19 DIAGNOSIS — R5383 Other fatigue: Secondary | ICD-10-CM | POA: Insufficient documentation

## 2017-07-19 DIAGNOSIS — Z7982 Long term (current) use of aspirin: Secondary | ICD-10-CM | POA: Diagnosis not present

## 2017-07-19 DIAGNOSIS — I5030 Unspecified diastolic (congestive) heart failure: Secondary | ICD-10-CM | POA: Insufficient documentation

## 2017-07-19 DIAGNOSIS — K8020 Calculus of gallbladder without cholecystitis without obstruction: Secondary | ICD-10-CM | POA: Diagnosis not present

## 2017-07-19 DIAGNOSIS — J449 Chronic obstructive pulmonary disease, unspecified: Secondary | ICD-10-CM | POA: Diagnosis not present

## 2017-07-19 DIAGNOSIS — I130 Hypertensive heart and chronic kidney disease with heart failure and stage 1 through stage 4 chronic kidney disease, or unspecified chronic kidney disease: Secondary | ICD-10-CM | POA: Insufficient documentation

## 2017-07-19 DIAGNOSIS — R42 Dizziness and giddiness: Secondary | ICD-10-CM | POA: Insufficient documentation

## 2017-07-19 DIAGNOSIS — R1032 Left lower quadrant pain: Secondary | ICD-10-CM | POA: Diagnosis not present

## 2017-07-19 DIAGNOSIS — R002 Palpitations: Secondary | ICD-10-CM | POA: Insufficient documentation

## 2017-07-19 LAB — URINALYSIS, ROUTINE W REFLEX MICROSCOPIC
Bilirubin Urine: NEGATIVE
Glucose, UA: NEGATIVE mg/dL
HGB URINE DIPSTICK: NEGATIVE
Ketones, ur: NEGATIVE mg/dL
LEUKOCYTES UA: NEGATIVE
NITRITE: NEGATIVE
Protein, ur: NEGATIVE mg/dL
SPECIFIC GRAVITY, URINE: 1.01 (ref 1.005–1.030)
pH: 8 (ref 5.0–8.0)

## 2017-07-19 LAB — CBC
HEMATOCRIT: 28.1 % — AB (ref 36.0–46.0)
HEMOGLOBIN: 9 g/dL — AB (ref 12.0–15.0)
MCH: 25.7 pg — AB (ref 26.0–34.0)
MCHC: 32 g/dL (ref 30.0–36.0)
MCV: 80.3 fL (ref 78.0–100.0)
Platelets: 493 10*3/uL — ABNORMAL HIGH (ref 150–400)
RBC: 3.5 MIL/uL — ABNORMAL LOW (ref 3.87–5.11)
RDW: 16.2 % — AB (ref 11.5–15.5)
WBC: 8.3 10*3/uL (ref 4.0–10.5)

## 2017-07-19 LAB — I-STAT TROPONIN, ED: Troponin i, poc: 0.12 ng/mL (ref 0.00–0.08)

## 2017-07-19 LAB — BASIC METABOLIC PANEL
ANION GAP: 15 (ref 5–15)
BUN: 66 mg/dL — AB (ref 6–20)
CALCIUM: 9.7 mg/dL (ref 8.9–10.3)
CO2: 21 mmol/L — AB (ref 22–32)
Chloride: 100 mmol/L — ABNORMAL LOW (ref 101–111)
Creatinine, Ser: 1.65 mg/dL — ABNORMAL HIGH (ref 0.44–1.00)
GFR calc Af Amer: 34 mL/min — ABNORMAL LOW (ref >60)
GFR, EST NON AFRICAN AMERICAN: 30 mL/min — AB (ref >60)
GLUCOSE: 130 mg/dL — AB (ref 65–99)
POTASSIUM: 5.3 mmol/L — AB (ref 3.5–5.1)
Sodium: 136 mmol/L (ref 135–145)

## 2017-07-19 LAB — HEPATIC FUNCTION PANEL
ALBUMIN: 3.2 g/dL — AB (ref 3.5–5.0)
ALK PHOS: 69 U/L (ref 38–126)
ALT: 45 U/L (ref 14–54)
AST: 92 U/L — AB (ref 15–41)
BILIRUBIN DIRECT: 0.1 mg/dL (ref 0.1–0.5)
Indirect Bilirubin: 0.4 mg/dL (ref 0.3–0.9)
Total Bilirubin: 0.5 mg/dL (ref 0.3–1.2)
Total Protein: 7.2 g/dL (ref 6.5–8.1)

## 2017-07-19 LAB — LIPASE, BLOOD: Lipase: 35 U/L (ref 11–51)

## 2017-07-19 LAB — POC OCCULT BLOOD, ED: FECAL OCCULT BLD: NEGATIVE

## 2017-07-19 MED ORDER — ONDANSETRON HCL 4 MG/2ML IJ SOLN: 4.0000 mg | Freq: Once | INTRAMUSCULAR | Status: DC

## 2017-07-19 MED ORDER — MORPHINE SULFATE (PF) 4 MG/ML IV SOLN
4.0000 mg | Freq: Once | INTRAVENOUS | Status: AC
  Administered 2017-07-19: 4 mg via INTRAVENOUS
  Filled 2017-07-19: qty 1

## 2017-07-19 MED ORDER — SODIUM POLYSTYRENE SULFONATE 15 GM/60ML PO SUSP
30.0000 g | Freq: Once | ORAL | Status: AC
  Administered 2017-07-19: 30 g via ORAL
  Filled 2017-07-19: qty 120

## 2017-07-19 MED ORDER — SODIUM CHLORIDE 0.9 % IV BOLUS (SEPSIS)
500.0000 mL | Freq: Once | INTRAVENOUS | Status: AC
  Administered 2017-07-19: 500 mL via INTRAVENOUS

## 2017-07-19 MED ORDER — ASPIRIN 81 MG PO CHEW: 324.0000 mg | CHEWABLE_TABLET | Freq: Once | ORAL | Status: DC

## 2017-07-19 MED ORDER — NITROGLYCERIN 0.4 MG SL SUBL: 0.4000 mg | SUBLINGUAL_TABLET | SUBLINGUAL | Status: DC | PRN

## 2017-07-19 MED ORDER — IOPAMIDOL (ISOVUE-300) INJECTION 61%
INTRAVENOUS | Status: AC
  Filled 2017-07-19: qty 30

## 2017-07-19 MED ORDER — PROMETHAZINE HCL 25 MG/ML IJ SOLN
12.5000 mg | Freq: Once | INTRAMUSCULAR | Status: AC
  Administered 2017-07-19: 12.5 mg via INTRAVENOUS
  Filled 2017-07-19: qty 1

## 2017-07-19 NOTE — ED Provider Notes (Signed)
[facility] DEPT Provider Note   CSN: [id number] Arrival date & time: 07/19/17  1520     History   Chief Complaint Chief Complaint  Patient presents with  . Abdominal Pain  . Chest Pain    HPI [name] is a 71 y.o. female.  HPI   71 year old female with multiple comorbidities including history of alcohol abuse, GI bleed, tracheostomy, asthma, CHF, CAD, chronic A. fib with pacemaker brought in by EMS for evaluation of chest and abdominal pain. Patient states for the past 1-2 months she has not been feeling well, she complained of generalized fatigue, arthralgias, muscle stiffness. She was seen by her PCP 5 days ago for complaint. She was placed on steroid. The medication did help with her arthralgia however she lab complaining of pain to her upper abdomen and chest. Pain has been present since yesterday, she complained of heart palpitation, very nauseous, lightheadedness, dizziness, and has vomited multiple times. She also reports feeling constipated for the past for 5 days. Also endorsed having shortness of breath when pain is intense. She felt the symptoms started after she start taking prednisone. Patient had a [employer]. Jude pacemaker  Past Medical History:  Diagnosis Date  . Anemia   . Angina   . Angioedema    2/2 ACE  . Arteriovenous malformation of stomach   . Arthritis   . Asthma   . AVM (arteriovenous malformation) of colon    small intestine; stomach  . Blood transfusion   . CHF (congestive heart failure) ([place])   . Complete heart block ([place])    s/p PPM 1998  . Complication of anesthesia    Difficult airway; anaphylaxis and swelling with propofol  . DDD (degenerative disc disease)   . Depression   . Diastolic heart failure   . Fatty liver 07/26/10  . GERD (gastroesophageal reflux disease)   . GI bleed   . Headache   . History of alcohol abuse Stopped Fall 2012  . History of tobacco use Quit Fall 2012  . Hx of cardiovascular stress test    a. Lexiscan  Myoview (10/15):  Small inferolateral and apical defect c/w scar and poss soft tissue attenuation, no ischemia, EF 42%  . Hx of colonic polyp 08/13/10   adenomatous  . Hx of colonoscopy   . Hyperlipidemia   . Hyperlipidemia   . Hypertension   . Hypertrophic cardiomyopathy ([place])    dx by Dr [name] 2009  . Iron deficiency anemia   . Myocardial infarction ([place])   . Panic attack   . Panic attacks   . Permanent atrial fibrillation ([place])   . Pneumonia   . Renal failure    baseline creatinine 1.6  . Right arm pain 01/08/2012  . RLS (restless legs syndrome)    Dx 06/2007  . Shortness of breath    sob on exertation  . Sleep apnea     Patient Active Problem List   Diagnosis Date Noted  . Body aches 06/26/2017  . History of colonic polyps   . Polyp of cecum   . Polyp of ascending colon   . Polyp of transverse colon   . Polyp of descending colon   . Polyp of rectum   . Moderate protein-calorie malnutrition ([place]) 06/02/2017  . Chronic idiopathic constipation 03/19/2017  . Loss of weight 01/26/2017  . 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 Permanent atrial fibrillation ([place])   . Laryngeal stenosis 09/18/2016  . Esophageal dysphagia   . Acute respiratory failure with hypoxia ([place])   .  Dyspnea on exertion   . Polymyalgia rheumatica ([place]) 04/10/2015  . Low back pain 03/28/2015  . Hypertrophic cardiomyopathy ([place]) 02/15/2015  . Eczema 09/18/2014  . Environmental allergies 09/18/2014  . Pre-diabetes 11/26/2013  . COPD, moderate ([place]) 11/01/2013  . Cough 07/15/2013  . Preventative health care 08/18/2012  . Complete heart block ([place]) 04/03/2011  . Anemia 02/17/2011  . AVM (arteriovenous malformation) 02/13/2011  . PACEMAKER-St.Jude 11/28/2010  . CHRONIC KIDNEY DISEASE STAGE III (MODERATE) 09/24/2010  . Acute on chronic congestive heart failure ([place]) 08/30/2010  . ARTHRITIS 07/24/2010  . Generalized anxiety disorder 07/23/2010  . Hyperlipidemia 02/11/2007  . Essential hypertension  02/11/2007  . APNEA, SLEEP 02/11/2007    Past Surgical History:  Procedure Laterality Date  . CARDIAC CATHETERIZATION    . CARDIAC CATHETERIZATION N/A 08/19/2016   Procedure: Right/Left Heart Cath and Coronary Angiography;  Surgeon: Jolaine Artist, MD;  Location: [facility];  Service: Cardiovascular;  Laterality: N/A;  . COLONOSCOPY WITH PROPOFOL N/A 06/16/2017   Procedure: COLONOSCOPY WITH PROPOFOL;  Surgeon: Mauri Pole, MD;  Location: [facility];  Service: Endoscopy;  Laterality: N/A;  . DIRECT LARYNGOSCOPY N/A 09/18/2016   Procedure: DIRECT LARYNGOSCOPY, BRONCHOSCOPY, REMOVAL OF INTUBATION GRANULOMA;  Surgeon: [name], MD;  Location: [facility] OR;  Service: ENT;  Laterality: N/A;  . DIRECT LARYNGOSCOPY N/A 10/20/2016   Procedure: EXTUBATION AND FLEXIBLE LARYNGOSCOPE;  Surgeon: [name], MD;  Location: [facility];  Service: ENT;  Laterality: N/A;  . DIRECT LARYNGOSCOPY N/A 10/29/2016   Procedure: DIRECT LARYNGOSCOPY;  Surgeon: [name], MD;  Location: [facility] OR;  Service: ENT;  Laterality: N/A;  . ESOPHAGOGASTRODUODENOSCOPY  12/23/2011   Procedure: ESOPHAGOGASTRODUODENOSCOPY (EGD);  Surgeon: [name], MD;  Location: [facility];  Service: Endoscopy;  Laterality: N/A;  . ESOPHAGOGASTRODUODENOSCOPY (EGD) WITH PROPOFOL N/A 06/16/2017   Procedure: ESOPHAGOGASTRODUODENOSCOPY (EGD) WITH PROPOFOL;  Surgeon: Mauri Pole, MD;  Location: [facility];  Service: Endoscopy;  Laterality: N/A;  . EXTUBATION (ENDOTRACHEAL) IN OR N/A 07/21/2016   Procedure: EXTUBATION (ENDOTRACHEAL) IN OR;  Surgeon: [name], MD;  Location: [facility];  Service: ENT;  Laterality: N/A;  . GIVENS CAPSULE STUDY  12/23/2011   Procedure: GIVENS CAPSULE STUDY;  Surgeon: [name], MD;  Location: [facility];  Service: Endoscopy;  Laterality: N/A;  . MICROLARYNGOSCOPY WITH LASER N/A 10/16/2016   Procedure: MICRODIRECTLARYNGOSCOPY WITH LASER ABLATION AND KENLOG INJECTION;  Surgeon: [name], MD;   Location: [facility];  Service: ENT;  Laterality: N/A;  . PACEMAKER INSERTION  1998   st jude, most recent gen change by [name] 4/12  . TRACHEOSTOMY TUBE PLACEMENT N/A 10/29/2016   Procedure: TRACHEOSTOMY;  Surgeon: [name], MD;  Location: [facility];  Service: ENT;  Laterality: N/A;  . TUBAL LIGATION  04/01/2000    OB History    No data available       Home Medications    Prior to Admission medications   Medication Sig Start Date End Date Taking? Authorizing Provider  acetaminophen (TYLENOL) 325 MG tablet Take 325 mg by mouth every 6 (six) hours as needed.    [provider]  albuterol (PROVENTIL HFA;VENTOLIN HFA) 108 (90 Base) MCG/ACT inhaler Inhale 1-2 puffs into the lungs every 6 (six) hours as needed for wheezing or shortness of breath. Patient not taking: Reported on 07/09/2017 05/14/17   Domenic Moras, PA-C  albuterol (PROVENTIL) (2.5 MG/3ML) 0.083% nebulizer solution Take 3 mLs (2.5 mg total) by nebulization every 6 (six) hours as needed for  wheezing or shortness of breath. Patient not taking: Reported on 07/09/2017 06/26/17   Steve Rattler, DO  ALPRAZolam Duanne Moron) 0.5 MG tablet Take 1 tablet (0.5 mg total) by mouth at bedtime as needed for anxiety. Patient not taking: Reported on 07/09/2017 01/26/17   Steve Rattler, DO  aspirin EC 81 MG tablet Take 81 mg by mouth daily.     [provider]  bisacodyl (DULCOLAX) 10 MG suppository Place 10 mg rectally daily as needed for mild constipation.     [provider]  Cholecalciferol (VITAMIN D3) 5000 units CAPS Take 5,000 Units by mouth daily.    [provider]  ferrous sulfate 325 (65 FE) MG tablet TAKE 1 TABLET BY MOUTH TWICE DAILY WITH MEALS TO KEEP BLOOD COUNT UP Patient taking differently: Take 325 mg by mouth 3 (three) times daily. TO KEEP BLOOD COUNT UP 08/26/16   Lucila Maine C, DO  furosemide (LASIX) 40 MG tablet Take 1 tablet (40 mg total) by mouth daily. Take one tablet daily Patient taking  differently: Take 40 mg by mouth 2 (two) times daily.  01/28/17   Steve Rattler, DO  ibuprofen (ADVIL,MOTRIN) 200 MG tablet Take 200 mg by mouth every 6 (six) hours as needed.    [provider]  metoprolol tartrate (LOPRESSOR) 25 MG tablet TAKE 1 TABLET BY MOUTH EVERY 6 HOURS AS NEEDED FOR HEART FLUTTERING 04/08/17   Allred, Jeneen Rinks, MD  nitroGLYCERIN (NITROSTAT) 0.4 MG SL tablet Place 1 tablet (0.4 mg total) under the tongue every 5 (five) minutes as needed for chest pain. 01/18/15   Eileen Stanford, PA-C  omeprazole (PRILOSEC) 20 MG capsule Take 1 capsule (20 mg total) by mouth daily. Patient not taking: Reported on 07/09/2017 08/26/16   Lucila Maine C, DO  polyethylene glycol (MIRALAX / GLYCOLAX) packet Take 17 g by mouth daily. Patient taking differently: Take 17 g by mouth See admin instructions. Mix 1 capful (17 g) in a bottle of water and drink over the course of 2 days 07/26/16   Domenic Polite, MD  potassium chloride (K-DUR) 10 MEQ tablet Take 10 mEq by mouth daily.     [provider]  predniSONE (DELTASONE) 10 MG tablet Take 1 tablet (10 mg total) by mouth daily with breakfast. 07/16/17   Steve Rattler, DO  senna (SENOKOT) 8.6 MG TABS tablet Take 1 tablet (8.6 mg total) by mouth at bedtime. 03/19/17   Steve Rattler, DO    Family History Family History  Problem Relation Age of Onset  . Hypertension Mother   . Stroke Mother   . Heart disease Mother   . Aneurysm Mother   . CVA Mother   . Hypertension Father   . Stroke Father   . Heart attack Father   . Alcohol abuse Father   . Hypertension Sister   . Hypertension Brother   . Colon cancer Brother   . Cancer Brother   . Diabetes Sister   . Diabetes Brother   . Heart attack Brother   . Heart attack Brother   . Heart disease Sister   . Alcohol abuse Brother   . Heart disease Daughter     Social History Social History  Substance Use Topics  . Smoking status: Former Smoker    Packs/day: 0.10     Years: 50.00    Types: Cigarettes    Quit date: 08/16/2011  . Smokeless tobacco: Never Used     Comment: quit 2012  . Alcohol  use No     Comment: quit 08/16/2011     Allergies   Ace inhibitors and Other   Review of Systems Review of Systems  All other systems reviewed and are negative.    Physical Exam Updated Vital Signs Ht 5\' 4"  (1.626 m)   Wt 61.2 kg (135 lb)   BMI 23.17 kg/m   Physical Exam  Constitutional: She is oriented to person, place, and time.  Chronically ill-appearing female appears uncomfortable.  HENT:  Head: Atraumatic.  Eyes: Conjunctivae are normal.  Neck: Neck supple.  Tracheostomy tube with Passy-Muir valve in place  Cardiovascular:  Irregularly irregular heart rhythm without murmurs rubs or gallops  Pulmonary/Chest:  Scattered rhonchi without wheezes or rales heard  Abdominal: Soft. Bowel sounds are normal. She exhibits no distension. There is tenderness (Mild epigastric tenderness without guarding or rebound tenderness).  Genitourinary:  Genitourinary Comments: Chaperone present during exam.  Normal external rectum with non thrombosed external hemorrhoid.  No stool impaction, no mass, normal rectal tone.   Musculoskeletal: She exhibits no edema.  Neurological: She is alert and oriented to person, place, and time.  Skin: No rash noted.  Psychiatric: She has a normal mood and affect.  Nursing note and vitals reviewed.    ED Treatments / Results  Labs (all labs ordered are listed, but only abnormal results are displayed) Labs Reviewed  BASIC METABOLIC PANEL  CBC  I-STAT TROPONIN, ED    EKG  EKG Interpretation  Date/Time:  Sunday July 19 2017 15:29:57 EDT Ventricular Rate:  70 PR Interval:    QRS Duration: 209 QT Interval:  508 QTC Calculation: 549 R Axis:   -87 Text Interpretation:  Age not entered, assumed to be  71 years old for purpose of ECG interpretation VENTRICULAR PACED RHYTHM Abnormal ekg Confirmed by Carmin Muskrat  [phone]) on 07/19/2017 3:36:57 PM       Radiology No results found.  Procedures Procedures (including critical care time)  Medications Ordered in ED Medications - No data to display   Initial Impression / Assessment and Plan / ED Course  I have reviewed the triage vital signs and the nursing notes.  Pertinent labs & imaging results that were available during my care of the patient were reviewed by me and considered in my medical decision making (see chart for details).     BP 106/62 (BP Location: Right Arm)   Pulse 72   Temp 98.6 F (37 C) (Oral)   Resp 19   Ht 5\' 4"  (1.626 m)   Wt 61.2 kg (135 lb)   SpO2 99%   BMI 23.17 kg/m    Final Clinical Impressions(s) / ED Diagnoses   Final diagnoses:  Epigastric pain  Lower abdominal pain  Generalized abdominal pain    New Prescriptions Discharge Medication List as of 07/19/2017  9:58 PM     4:06 PM This patient has multiple comorbidities. She is complaining of epigastric abdominal pain. Her EKG is without acute ischemic changes. She does have an elevated troponin of 0.12 however, she has had persistent elevation of troponin around this level. She has a hard catheterization in onset, 2017 show normal coronary arteries. She was recently diagnosed with polymyalgia rheumatica and was treated with steroid. Prior to that she was complaining of muscle pain that is thought to be related to taking a statin in which her medication has been discontinued.  Her sxs likely acute on chronic as she has been seen multiple times in the ER for same.  6:48 PM Hemoccult negative, hepatic function panel showing mildly elevated AST of 92, higher than her baseline, K+ is 5.3, no obvious EKG changes, however will give lactulose.  Cr 1.65, near baseline.  Hemoglobin is 9.0, near baseline.  Acute abdominal series showing moderate retained large bowel stool, with normal bowel gas pattern.  This could be related to her constipation.  I performed rectal  exam, no impacted stool on exam. She has a pacemaker, will request rep to come and interrogate her pacemaker as pt report not feeling well, and currently her sxs is undifferentiated. Care discussed with Dr. Oleta Mouse who evaluated pt and felt she will benefit from abd/pelvis CT scan.  If normal, pt stable to be discharge with f/u with PCP.  She does have hx of recurrent constipation.  Encourage eating food high in fiber, continue with miralax and stool softener.   7:47 PM Pt felt better, resting comfortably. Currently await CT scan. Pt will also need interrogation of her pacemaker.  Pt sign out to oncoming provider to f/u on her CT scan, UA and pace maker result.  If all are normal, pt can f/lu with PCP for further care.  Pt voice understanding and agrees with plan.    Domenic Moras, PA-C 07/22/17 1188    Carmin Muskrat, MD 07/22/17 2032

## 2017-07-19 NOTE — ED Notes (Signed)
Per Aaron Edelman at [facility], he should arrive in approx 20-25 mins.

## 2017-07-19 NOTE — ED Notes (Signed)
ED Provider at bedside. 

## 2017-07-19 NOTE — Discharge Instructions (Signed)
Please follow up with your primary care provider for further evaluation of your abdominal discomfort and constipation.  Continue to take your stool softener and Miralax.  Return if your condition worsen or if you have other concerns.

## 2017-07-19 NOTE — ED Notes (Signed)
[employer] rep at the bedside.

## 2017-07-19 NOTE — ED Provider Notes (Signed)
Medical screening examination/treatment/procedure(s) were conducted as a shared visit with non-physician practitioner(s) and myself.  I personally evaluated the patient during the encounter.   EKG Interpretation  Date/Time:  Sunday July 19 2017 15:29:57 EDT Ventricular Rate:  70 PR Interval:    QRS Duration: 209 QT Interval:  508 QTC Calculation: 549 R Axis:   -87 Text Interpretation:  Age not entered, assumed to be  71 years old for purpose of ECG interpretation VENTRICULAR PACED RHYTHM Abnormal ekg Confirmed by Lockwood, Robert ([id number]) on 07/19/2017 3:36:57 PM      71  year old female who presents with generalized abdominal pain, worse in the epigastrium. States occasionally has abdominal pain associated with constipation, but pain is appears to be worse over the past 2 days. Has been more constipated and having nausea. Denies any vomiting. Has not had previous abdominal surgeries. No fevers or chills. Occasionally feels a fluttering in her chest, but currently denies any chest pain or difficulty breathing, syncope or near syncope.  She is well-appearing and in no acute distress. Her vitals are stable. Abdomen is overall very soft and nonsurgical, but was on some mild generalized tenderness. CT abdomen and pelvis visualized and shows no acute intra-abdominal processes. Her urine is not suggestive of UTI. She has a chronically elevated troponin, and her symptoms do not seem consistent with that of ACS. She also has left heart catheterization in 2017 that showed normal coronary arteries.  I discussed bowel regimen with patient. Strict return and follow-up instructions reviewed. She expressed understanding of all discharge instructions and felt comfortable with the plan of care.    Forde Dandy, MD 07/19/17 2200

## 2017-07-19 NOTE — ED Notes (Signed)
Attempted to interrogate pacemaker x 3 with no success. Roselyn Reef, charge RN to help this nurse troubleshoot device.

## 2017-07-19 NOTE — ED Notes (Addendum)
Attempted to f/u with [employer] about device transmission. Per representative, [employer] transmission machine does not work with pt device. This RN spoke with local [employer] rep, currently at [facility] with another pt but will hopefully be here within the hour to interrogate pacemaker. Gertie Fey, [place] aware. Pt and family updated.

## 2017-07-19 NOTE — ED Notes (Addendum)
Pt daughter leaving, requested this RN call when plan of care decided. [name], phone number located under Snapshot.

## 2017-07-19 NOTE — ED Notes (Signed)
Pt given [place] sandwich per Dr. Oleta Mouse

## 2017-07-19 NOTE — ED Triage Notes (Signed)
Pt from home via [facility] with abd pain, CP, SOB, and nausea. Per [facility], pt seen by PCP on Thurs for recent generalized joint aches and given prescription for prednisone. Pt reports since taking prednisone she has experienced abd pain, nausea, decreased appetite, and one episode of vomiting. Pt also reports increased SOB today and reports recurrent CP that began months ago but is worse today. Pt A&Ox4. Pt reports she did not take her lasix or BP medication today. Pt with [employer]. Jude pacemaker. [facility] VS 90/50, 70 bpm, 99% RA, 24 RR.

## 2017-07-19 NOTE — ED Notes (Signed)
Delay in medication administration due to [facility]. Jude rep at the bedside and pt finishing contrast for scan. Pt in CT at this time, will administer ordered kayexalate upon return.

## 2017-07-19 NOTE — ED Notes (Addendum)
Pt daughter called by this RN, on her way to pick up pt.

## 2017-07-19 NOTE — ED Notes (Signed)
Per main lab, will add on HFP and lipase.

## 2017-07-19 NOTE — ED Notes (Signed)
Report from pt pulse generator given to Dr. Oleta Mouse

## 2017-07-19 NOTE — ED Notes (Signed)
Patient transported to X-ray 

## 2017-07-21 ENCOUNTER — Encounter: Payer: Self-pay | Admitting: Nurse Practitioner

## 2017-07-23 DIAGNOSIS — J386 Stenosis of larynx: Secondary | ICD-10-CM | POA: Diagnosis not present

## 2017-07-23 DIAGNOSIS — Z430 Encounter for attention to tracheostomy: Secondary | ICD-10-CM | POA: Diagnosis not present

## 2017-07-23 DIAGNOSIS — G4733 Obstructive sleep apnea (adult) (pediatric): Secondary | ICD-10-CM | POA: Diagnosis not present

## 2017-07-27 DIAGNOSIS — Z930 Tracheostomy status: Secondary | ICD-10-CM | POA: Diagnosis not present

## 2017-07-27 DIAGNOSIS — I4891 Unspecified atrial fibrillation: Secondary | ICD-10-CM | POA: Diagnosis not present

## 2017-07-27 DIAGNOSIS — J449 Chronic obstructive pulmonary disease, unspecified: Secondary | ICD-10-CM | POA: Diagnosis not present

## 2017-07-27 DIAGNOSIS — J398 Other specified diseases of upper respiratory tract: Secondary | ICD-10-CM | POA: Diagnosis not present

## 2017-07-27 DIAGNOSIS — D649 Anemia, unspecified: Secondary | ICD-10-CM | POA: Diagnosis not present

## 2017-07-29 ENCOUNTER — Encounter: Payer: Self-pay | Admitting: Family Medicine

## 2017-07-29 ENCOUNTER — Ambulatory Visit (INDEPENDENT_AMBULATORY_CARE_PROVIDER_SITE_OTHER): Payer: Medicare Other | Admitting: Family Medicine

## 2017-07-29 VITALS — BP 104/58 | HR 70 | Temp 98.2°F | Ht 64.0 in | Wt 137.0 lb

## 2017-07-29 DIAGNOSIS — R42 Dizziness and giddiness: Secondary | ICD-10-CM

## 2017-07-29 DIAGNOSIS — M353 Polymyalgia rheumatica: Secondary | ICD-10-CM

## 2017-07-29 MED ORDER — PREDNISONE 20 MG PO TABS: 20.0000 mg | ORAL_TABLET | Freq: Every day | ORAL | 0 refills | Status: AC

## 2017-07-29 NOTE — Progress Notes (Signed)
    Subjective:    Patient ID: [name], female    DOB: 01/10/1946, 71 y.o.   MRN: [id number]   CC: follow up on PMR flare  PMR -muscle aches initially improved on 10 mg steroids but have been persistently bothering her, mostly in her shoulders and legs -she takes 10 mg every morning and tries to take it with ensure -overall does not feel back to normal yet however some improvement -no rash noted  Lightheadedness -worried this is related to steroid -she is not on any BP medications but takes toprol 25 mg for palpitations -endorses palpitations frequently and takes this medication frequently -history of poor PO intake -she is also on lasix BID for LE edema as well as xanax as needed for anxiety  Smoking status reviewed- former smoker 50 pack years  Review of Systems- see HPI. Additionally complains of palpitations and lightheadedness   Objective:  BP (!) 104/58   Pulse 70   Temp 98.2 F (36.8 C) (Oral)   Ht 5' 4" (1.626 m)   Wt 137 lb (62.1 kg)   SpO2 99%   BMI 23.52 kg/m  Vitals and nursing note reviewed  General: well nourished, in no acute distress Cardiac: RRR, clear S1 and S2, no murmurs, rubs, or gallops Respiratory: clear to auscultation bilaterally, no increased work of breathing Extremities: no edema or cyanosis Skin: warm and dry, no rashes noted Neuro: alert and oriented, no focal deficits  Assessment & Plan:    Polymyalgia rheumatica (HCC)  Improved but not resolved.  -increase steroid to 20 mg daily -follow up 2 weeks to see how patient is doing at that time -awaiting appointment with rheumatology Dr Deveshwar  -repeat ESR and CRP today to assess trend  Lightheadedness  Patient endorses lightheadedness today, she is mildly hypotensive today in clinic. She takes metoprolol as needed for palpitations and took it this morning and takes it frequently  -advised patient to cut lopressor in half if she needs to take it -consider  decreasing lasix to daily -encourage increasing oral intake -return precautions given -follow up 2 weeks    Return in about 2 weeks (around 08/12/2017).    , DO Family Medicine Resident PGY-2     

## 2017-07-29 NOTE — Assessment & Plan Note (Addendum)
  Patient endorses lightheadedness today, she is mildly hypotensive today in clinic. She takes metoprolol as needed for palpitations and took it this morning and takes it frequently  -advised patient to cut lopressor in half if she needs to take it -consider decreasing lasix to daily -encourage increasing oral intake -return precautions given -follow up 2 weeks

## 2017-07-29 NOTE — Assessment & Plan Note (Addendum)
  Improved but not resolved.  -increase steroid to 20 mg daily -follow up 2 weeks to see how patient is doing at that time -awaiting appointment with rheumatology Dr Estanislado Pandy  -repeat ESR and CRP today to assess trend

## 2017-07-29 NOTE — Patient Instructions (Addendum)
  Please take 20 mg of prednisone every morning for the next 2 weeks.   You can reach out to Dr. Arlean Hopping office at [phone] to ask about your appointment.   Please only take half of your metoprolol (12.5mg ) if you have heart flutters. This medicine can decrease your blood pressure and make you feel lightheaded.   If you have questions or concerns please do not hesitate to call at [phone].  Lucila Maine, DO PGY-2, [facility] 07/29/2017 3:42 PM

## 2017-07-30 LAB — SEDIMENTATION RATE: Sed Rate: 61 mm/hr — ABNORMAL HIGH (ref 0–40)

## 2017-07-30 LAB — C-REACTIVE PROTEIN: CRP: 24.3 mg/L — AB (ref 0.0–4.9)

## 2017-08-04 DIAGNOSIS — Z930 Tracheostomy status: Secondary | ICD-10-CM | POA: Diagnosis not present

## 2017-08-04 DIAGNOSIS — J449 Chronic obstructive pulmonary disease, unspecified: Secondary | ICD-10-CM | POA: Diagnosis not present

## 2017-08-11 ENCOUNTER — Ambulatory Visit (INDEPENDENT_AMBULATORY_CARE_PROVIDER_SITE_OTHER): Payer: Medicare Other | Admitting: Family Medicine

## 2017-08-11 VITALS — BP 120/74 | HR 70 | Temp 98.5°F | Ht 64.0 in | Wt 139.6 lb

## 2017-08-11 DIAGNOSIS — M353 Polymyalgia rheumatica: Secondary | ICD-10-CM

## 2017-08-11 MED ORDER — ALBUTEROL SULFATE HFA 108 (90 BASE) MCG/ACT IN AERS: 1.0000 | INHALATION_SPRAY | Freq: Four times a day (QID) | RESPIRATORY_TRACT | 6 refills | Status: DC | PRN

## 2017-08-11 MED ORDER — PREDNISONE 20 MG PO TABS
20.0000 mg | ORAL_TABLET | Freq: Every day | ORAL | 4 refills | Status: DC
Start: 2017-08-11 — End: 2017-12-16

## 2017-08-11 NOTE — Progress Notes (Signed)
    Subjective:    Patient ID: [name], female    DOB: 09-29-1946, 71 y.o.   MRN: [id number]   CC: follow up muscle pains/PMR flare  Has been taking 20 mg prednisone daily and feels much improved. Has persistent stiffness in hands. Her ROM has improved in her shoulders greatly. She can now get out of bed without much issue whereas before she had a hard time. Rheum referral declined for Dr. Estanislado Pandy. She is tolerating steroid well. No increased jittery-ness, no insomnia. Appetite is about the same. No LE edema.  She endorses she may have a hard time getting to rheumatology office further away from here. Requesting letter to her children to let them know it is important for her to get to appointments as they think she is "faking and attention seeking".  Smoking status reviewed- former smoker  Review of Systems- see HPI   Objective:  BP 120/74   Pulse 70   Temp 98.5 F (36.9 C)   Ht 5\' 4"  (1.626 m)   Wt 139 lb 9.6 oz (63.3 kg)   SpO2 99%   BMI 23.96 kg/m  Vitals and nursing note reviewed  General: well nourished, in no acute distress Cardiac: RRR, clear S1 and S2, no murmurs, rubs, or gallops Respiratory: clear to auscultation bilaterally, no increased work of breathing Extremities: no edema or cyanosis Skin: warm and dry, no rashes noted Neuro: alert and oriented, no focal deficits  Assessment & Plan:    Polymyalgia rheumatica (HCC)  Improving on steroids. Rheumatology referral pending to [facility] Rheumatology as Dr. Estanislado Pandy declined patient.  -refilled 20 mg prednisone x 30 days with 4 refills, anticipate patient will need several more months of steroids  -await rheum appointment -follow up 6 weeks    Return in about 6 weeks (around 09/22/2017).   Lucila Maine, DO Family Medicine Resident PGY-2

## 2017-08-11 NOTE — Patient Instructions (Addendum)
  To Ms. Harb-Polk's children:  It's important your mother see the specialist for her autoimmune disorder. She is on a medication to calm down her immune system and this has helped immensely with her muscle and joint pains. The specialist office will be calling within the next week or so to schedule an appointment. They are located at:  [address], [place], [place] [address]   If you have questions or concerns please do not hesitate to call at [phone].  Lucila Maine, DO PGY-2, [facility] 08/11/2017 2:04 PM

## 2017-08-11 NOTE — Assessment & Plan Note (Signed)
  Improving on steroids. Rheumatology referral pending to [facility] Rheumatology as Dr. Estanislado Pandy declined patient.  -refilled 20 mg prednisone x 30 days with 4 refills, anticipate patient will need several more months of steroids  -await rheum appointment -follow up 6 weeks

## 2017-08-18 DIAGNOSIS — H40023 Open angle with borderline findings, high risk, bilateral: Secondary | ICD-10-CM | POA: Diagnosis not present

## 2017-08-18 DIAGNOSIS — H5203 Hypermetropia, bilateral: Secondary | ICD-10-CM | POA: Diagnosis not present

## 2017-08-18 DIAGNOSIS — H25813 Combined forms of age-related cataract, bilateral: Secondary | ICD-10-CM | POA: Diagnosis not present

## 2017-08-18 DIAGNOSIS — H10413 Chronic giant papillary conjunctivitis, bilateral: Secondary | ICD-10-CM | POA: Diagnosis not present

## 2017-08-18 DIAGNOSIS — H35033 Hypertensive retinopathy, bilateral: Secondary | ICD-10-CM | POA: Diagnosis not present

## 2017-08-24 ENCOUNTER — Ambulatory Visit (INDEPENDENT_AMBULATORY_CARE_PROVIDER_SITE_OTHER): Payer: Medicare Other | Admitting: Family Medicine

## 2017-08-24 ENCOUNTER — Encounter: Payer: Self-pay | Admitting: Family Medicine

## 2017-08-24 ENCOUNTER — Other Ambulatory Visit: Payer: Self-pay | Admitting: Family Medicine

## 2017-08-24 DIAGNOSIS — N183 Chronic kidney disease, stage 3 unspecified: Secondary | ICD-10-CM

## 2017-08-24 DIAGNOSIS — R634 Abnormal weight loss: Secondary | ICD-10-CM

## 2017-08-24 NOTE — Progress Notes (Signed)
Medical Nutrition Therapy:  Appt start time: 1430 end time:  5625. Gastroenterologist: Leta Jungling, MD PCP: Lucila Maine, DO  Daughter: [name]  NOTE: Poor reading level; daughter helps her with this.   Assessment:  Primary concerns today: CKD dietary management, BG control, and unintentional weight loss (N18.3, E11.8; R63.4).  Weight is up 8 lb since appt 6 1/2 wks ago.   Ms. Marquard has been feeling better, as shown in her weight gain of ~8 lb since last seen for nutrition 6-7 weeks ago, appetite helped no doubt by the prednisone she has been taking since 8/28, which has diminished her joint pain almost entirely.   She has been eating 3 meals a day and frequent snacks, including sweets and chips.    Learning Readiness: Ready  Recent physical activity includes 5-10 min ~2 X wk exercises, either standing, holding onto her dresser, or while in bed in the morning.  She cooks a full meal about twice a meal.      24-hr recall:  (Up at 6 AM) B (7 AM)-  1 bottle Ensure, 4 oz cranberry juice, 1 c cooked cheese grits  Snk ( AM)-  water L (1 PM)-  1 c noodles with sauce, 4 boiled shrimp, 1 slice custard pie, water Snk (2 PM)-  1 c grapefruit juice Snk (4 PM)-  1 slc cake D ( PM)-  ?? Snk ( PM)-  ?? Typical day? Yes.  although she can't remember yesterday's intake.  Has recently been snacking frequently.  Eats some cucumbers and tomatoes most days, usually at dinner.    Progress Towards Goal(s):  In progress.   Nutritional Diagnosis:  Progress noted on: NI-1.4 Inadequate energy intake As related to energy requirements.  As evidenced by increased appetite and weight gain of 8 lb in ~6 weeks.    Intervention:  Nutrition education  Handouts given during visit include:  AVS, large print  Monitoring/Evaluation:  Dietary intake, exercise, and body weight in 9 week(s).  No appts available before.

## 2017-08-24 NOTE — Patient Instructions (Addendum)
-   You have an appointment with Dr. Shawna Orleans on Thursday, Sept 13 at 2:20 PM.  [facility] [phone]) will call you to schedule an appointment.    - If you eat lemons, rinse your mouth out well with water after.  (Do not brush right after eating lemon without rinsing well first.)  - Especially while you are taking prednisone, it's best to limit sweets, and even fruit juice  - It may help with constipation if you increase your vegetable intake:  Aim for getting vegetables at both lunch and dinner.  Feel free to eat as many vegetables as you want.    Goals:  1. Limit sweets to once a day, especially while you are taking prednisone.  2. Get at least one serving of vegetables at both lunch and dinner.  (No limit on size of serving.) 3. Increase your exercise routine at home to 5 times a week.    - Look into the exercise classes at [facility]: [phone].  - Call Dr. Jenne Campus if you have questions:  [phone].

## 2017-08-25 ENCOUNTER — Other Ambulatory Visit: Payer: Self-pay | Admitting: *Deleted

## 2017-08-25 DIAGNOSIS — D649 Anemia, unspecified: Secondary | ICD-10-CM | POA: Diagnosis not present

## 2017-08-25 DIAGNOSIS — J449 Chronic obstructive pulmonary disease, unspecified: Secondary | ICD-10-CM | POA: Diagnosis not present

## 2017-08-25 DIAGNOSIS — J398 Other specified diseases of upper respiratory tract: Secondary | ICD-10-CM | POA: Diagnosis not present

## 2017-08-25 DIAGNOSIS — I4891 Unspecified atrial fibrillation: Secondary | ICD-10-CM | POA: Diagnosis not present

## 2017-08-25 DIAGNOSIS — Z930 Tracheostomy status: Secondary | ICD-10-CM | POA: Diagnosis not present

## 2017-08-25 NOTE — Telephone Encounter (Signed)
Pt states that she has been taking 80mg  lasix and directed by MD.  She is now out and the pharmacy will not refill because it is too early.  She needs a new script sent to [facility] on [address].    She would like a call so she know when she may pick up.  Fleeger, Salome Spotted, CMA

## 2017-08-26 ENCOUNTER — Other Ambulatory Visit: Payer: Self-pay | Admitting: Family Medicine

## 2017-08-26 MED ORDER — FUROSEMIDE 40 MG PO TABS: 40.0000 mg | ORAL_TABLET | Freq: Two times a day (BID) | ORAL | 3 refills | Status: DC

## 2017-08-26 NOTE — Telephone Encounter (Signed)
Sent in prescription for lasix refill, also printed rx for xanax that patient can come in and pick up. Please inform her. Thanks

## 2017-08-26 NOTE — Telephone Encounter (Signed)
Pt contacted and informed of rx ready for pick up.

## 2017-08-27 ENCOUNTER — Encounter: Payer: Self-pay | Admitting: Family Medicine

## 2017-08-27 ENCOUNTER — Ambulatory Visit (INDEPENDENT_AMBULATORY_CARE_PROVIDER_SITE_OTHER): Payer: Medicare Other | Admitting: Family Medicine

## 2017-08-27 VITALS — BP 130/88 | HR 88 | Temp 98.0°F | Ht 64.0 in | Wt 143.0 lb

## 2017-08-27 DIAGNOSIS — J441 Chronic obstructive pulmonary disease with (acute) exacerbation: Secondary | ICD-10-CM

## 2017-08-27 DIAGNOSIS — J449 Chronic obstructive pulmonary disease, unspecified: Secondary | ICD-10-CM | POA: Diagnosis not present

## 2017-08-27 MED ORDER — AZITHROMYCIN 250 MG PO TABS: ORAL_TABLET | ORAL | 0 refills | Status: DC

## 2017-08-27 MED ORDER — PREDNISONE 20 MG PO TABS: 40.0000 mg | ORAL_TABLET | Freq: Every day | ORAL | 0 refills | Status: AC

## 2017-08-27 NOTE — Patient Instructions (Signed)
For your breathing issue, we will treat like a COPD exacerbation - Take antibiotic azithromycin for 5 days - Take prednisone 40mg  daily for 5 days and then go back to your previous dose  Please bring all of your medications with you to each visit.   Sign up for My Chart to have easy access to your labs results, and communication with your primary care physician.  Feel free to call with any questions or concerns at any time, at [phone].   Take care,  Dr. Bufford Lope, DO [facility]   Chronic Obstructive Pulmonary Disease Exacerbation Chronic obstructive pulmonary disease (COPD) is a common lung problem. In COPD, the flow of air from the lungs is limited. COPD exacerbations are times that breathing gets worse and you need extra treatment. Without treatment they can be life threatening. If they happen often, your lungs can become more damaged. If your COPD gets worse, your doctor may treat you with:  Medicines.  Oxygen.  Different ways to clear your airway, such as using a mask.  Follow these instructions at home:  Do not smoke.  Avoid tobacco smoke and other things that bother your lungs.  If given, take your antibiotic medicine as told. Finish the medicine even if you start to feel better.  Only take medicines as told by your doctor.  Drink enough fluids to keep your pee (urine) clear or pale yellow (unless your doctor has told you not to).  Use a cool mist machine (vaporizer).  If you use oxygen or a machine that turns liquid medicine into a mist (nebulizer), continue to use them as told.  Keep up with shots (vaccinations) as told by your doctor.  Exercise regularly.  Eat healthy foods.  Keep all doctor visits as told. Get help right away if:  You are very short of breath and it gets worse.  You have trouble talking.  You have bad chest pain.  You have blood in your spit (sputum).  You have a fever.  You keep throwing up  (vomiting).  You feel weak, or you pass out (faint).  You feel confused.  You keep getting worse. This information is not intended to replace advice given to you by your health care provider. Make sure you discuss any questions you have with your health care provider. Document Released: 11/20/2011 Document Revised: 05/08/2016 Document Reviewed: 08/05/2013 Elsevier Interactive Patient Education  2017 [employer].

## 2017-08-27 NOTE — Assessment & Plan Note (Signed)
Patient here with cough with perhaps more sputum production and increased dyspnea from baseline. She is well appearing on exam but has poor air movement on lung exam. No crackles or rhonchi so PNA unlikely. Will treat as COPD exacerbation with azithromycin and prednisone burst for 5 days.

## 2017-08-27 NOTE — Progress Notes (Signed)
    Subjective:  [name] is a 71 y.o. female who presents to the [facility] today with a chief complaint of cough  HPI:  Cough - Since the end of august - productive cough with white-slightly yellow sputum, originally thought it was mucus from trach but is more sputum than usual - wheezing with intermittently short of breath that may be slightly worse than her baseline - no fever/chills, no abd pain, no CP, no n/v, no diarrhea - states was heavy smoker in the past, quit 3-4 years ago   ROS: Per HPI  Objective:  Physical Exam: BP 130/88   Pulse 88   Temp 98 F (36.7 C) (Oral)   Ht 5\' 4"  (1.626 m)   Wt 143 lb (64.9 kg)   SpO2 99%   BMI 24.55 kg/m   Gen: NAD, resting comfortably CV: RRR with no murmurs appreciated Pulm: NWOB, poor air movement bilaterally with no crackles, wheezes, or rhonchi GI: Normal bowel sounds present. Soft, Nontender, Nondistended. MSK: no edema, cyanosis, or clubbing noted Skin: warm, dry Neuro: grossly normal, moves all extremities Psych: Normal affect and thought content   Assessment/Plan:  COPD, moderate (HCC) Patient here with cough with perhaps more sputum production and increased dyspnea from baseline. She is well appearing on exam but has poor air movement on lung exam. No crackles or rhonchi so PNA unlikely. Will treat as COPD exacerbation with azithromycin and prednisone burst for 5 days.    Bufford Lope, DO PGY-2, [facility] 08/27/2017 1:45 PM

## 2017-08-28 ENCOUNTER — Encounter: Payer: Medicare Other | Admitting: Nurse Practitioner

## 2017-08-30 NOTE — Progress Notes (Signed)
Cardiology Office Note Date:  09/02/2017  Patient ID:  [name], [name] [birth year], MRN [id number] PCP:  Steve Rattler, DO  Cardiologist:  [name] Electrophysiologist: Dr. Rayann Heman    Chief Complaint: planned f/u visit  History of Present Illness: [name] is a 71 y.o. female with history of CHB w/PPM, GIB 2/2 colonic AVMs, chronic anemia, HLD, HTN, CRI (III) permanent AFib, atypical CP (hx of cath with normal coronaries), nonobstructive HCM, tracheal stenosis >tracheostomy follows with Dr. Erik Obey, COPD, p.HTN with recurrent COPD exacerbations, pneumonia  The patient was being evaluated for [place] last year, had difficulties with screening TEE ultimately with respiratory failure requiring intubation, stunned LV that recovered, found with tracheal stenosis.  She comes today to be seen for Dr. Rayann Heman.  She last saw him in April, at that time, given above, felt conservative approach to her AF was best poc, suspected her palpitations were 2/2 PVCs and rx PRN metoprolol.  She has not felt the need to use her PRN lopressor in "a long time", she will feel occasionally flip/flop in her chest, no persistent palpitations or racing sensations.  No CP, near syncope or syncope.  She will get a little lightheaded when she has been standing a prolonged period of time, like washing dishes, will just take a break and get back at it, no falls, no syncope, not new.  She thinks she may be getting a little cold with a cough lately, she has discussed with her PMD, and sees her ENT on Monday, no fever or signs of illness.   Device information: MDT dual chamber PPM, programmed VVIR, implanted 07/08/1985, last gen change 04/08/11 Device dependent today   Past Medical History:  Diagnosis Date  . Anemia   . Angina   . Angioedema    2/2 ACE  . Arteriovenous malformation of stomach   . Arthritis   . Asthma   . AVM (arteriovenous malformation) of colon    small intestine; stomach    . Blood transfusion   . CHF (congestive heart failure) ([place])   . Complete heart block ([facility])    s/p PPM 1998  . Complication of anesthesia    Difficult airway; anaphylaxis and swelling with propofol  . DDD (degenerative disc disease)   . Depression   . Diastolic heart failure   . Fatty liver 07/26/10  . GERD (gastroesophageal reflux disease)   . GI bleed   . Headache   . History of alcohol abuse Stopped Fall 2012  . History of tobacco use Quit Fall 2012  . Hx of cardiovascular stress test    a. Lexiscan Myoview (10/15):  Small inferolateral and apical defect c/w scar and poss soft tissue attenuation, no ischemia, EF 42%  . Hx of colonic polyp 08/13/10   adenomatous  . Hx of colonoscopy   . Hyperlipidemia   . Hyperlipidemia   . Hypertension   . Hypertrophic cardiomyopathy ([employer])    dx by Dr [name] 2009  . Iron deficiency anemia   . Myocardial infarction ([place])   . Panic attack   . Panic attacks   . Permanent atrial fibrillation ([place])   . Pneumonia   . Renal failure    baseline creatinine 1.6  . Right arm pain 01/08/2012  . RLS (restless legs syndrome)    Dx 06/2007  . Shortness of breath    sob on exertation  . Sleep apnea     Past Surgical History:  Procedure Laterality Date  . CARDIAC CATHETERIZATION    .  CARDIAC CATHETERIZATION N/A 08/19/2016   Procedure: Right/Left Heart Cath and Coronary Angiography;  Surgeon: Jolaine Artist, MD;  Location: [facility];  Service: Cardiovascular;  Laterality: N/A;  . COLONOSCOPY WITH PROPOFOL N/A 06/16/2017   Procedure: COLONOSCOPY WITH PROPOFOL;  Surgeon: Mauri Pole, MD;  Location: [facility];  Service: Endoscopy;  Laterality: N/A;  . DIRECT LARYNGOSCOPY N/A 09/18/2016   Procedure: DIRECT LARYNGOSCOPY, BRONCHOSCOPY, REMOVAL OF INTUBATION GRANULOMA;  Surgeon: [name], MD;  Location: [facility] OR;  Service: ENT;  Laterality: N/A;  . DIRECT LARYNGOSCOPY N/A 10/20/2016   Procedure: EXTUBATION AND FLEXIBLE LARYNGOSCOPE;   Surgeon: [name], MD;  Location: [facility];  Service: ENT;  Laterality: N/A;  . DIRECT LARYNGOSCOPY N/A 10/29/2016   Procedure: DIRECT LARYNGOSCOPY;  Surgeon: [name], MD;  Location: [facility] OR;  Service: ENT;  Laterality: N/A;  . ESOPHAGOGASTRODUODENOSCOPY  12/23/2011   Procedure: ESOPHAGOGASTRODUODENOSCOPY (EGD);  Surgeon: [name], MD;  Location: [facility];  Service: Endoscopy;  Laterality: N/A;  . ESOPHAGOGASTRODUODENOSCOPY (EGD) WITH PROPOFOL N/A 06/16/2017   Procedure: ESOPHAGOGASTRODUODENOSCOPY (EGD) WITH PROPOFOL;  Surgeon: Mauri Pole, MD;  Location: [facility];  Service: Endoscopy;  Laterality: N/A;  . EXTUBATION (ENDOTRACHEAL) IN OR N/A 07/21/2016   Procedure: EXTUBATION (ENDOTRACHEAL) IN OR;  Surgeon: [name], MD;  Location: [facility];  Service: ENT;  Laterality: N/A;  . GIVENS CAPSULE STUDY  12/23/2011   Procedure: GIVENS CAPSULE STUDY;  Surgeon: [name], MD;  Location: [facility];  Service: Endoscopy;  Laterality: N/A;  . MICROLARYNGOSCOPY WITH LASER N/A 10/16/2016   Procedure: MICRODIRECTLARYNGOSCOPY WITH LASER ABLATION AND KENLOG INJECTION;  Surgeon: [name], MD;  Location: [facility];  Service: ENT;  Laterality: N/A;  . PACEMAKER INSERTION  1998   st jude, most recent gen change by [name] 4/12  . TRACHEOSTOMY TUBE PLACEMENT N/A 10/29/2016   Procedure: TRACHEOSTOMY;  Surgeon: [name], MD;  Location: [facility];  Service: ENT;  Laterality: N/A;  . TUBAL LIGATION  04/01/2000    Current Outpatient Prescriptions  Medication Sig Dispense Refill  . acetaminophen (TYLENOL) 325 MG tablet Take 325-650 mg by mouth every 6 (six) hours as needed.     [name] albuterol (PROVENTIL HFA;VENTOLIN HFA) 108 (90 Base) MCG/ACT inhaler Inhale 1-2 puffs into the lungs every 6 (six) hours as needed for wheezing or shortness of breath. 1 Inhaler 6  . albuterol (PROVENTIL) (2.5 MG/3ML) 0.083% nebulizer solution Take 3 mLs (2.5 mg total) by nebulization every 6 (six) hours as needed for  wheezing or shortness of breath. 75 mL 2  . ALPRAZolam (XANAX) 0.5 MG tablet TAKE 1 TABLET BY MOUTH AT BEDTIME AS NEEDED FOR ANXIETY 15 tablet 2  . aspirin EC 81 MG tablet Take 81 mg by mouth daily.     . bisacodyl (DULCOLAX) 10 MG suppository Place 10 mg rectally daily as needed for mild constipation.     . Cholecalciferol (VITAMIN D3) 5000 units CAPS Take 5,000 Units by mouth daily.    . ferrous sulfate 325 (65 FE) MG tablet TAKE 1 TABLET BY MOUTH TWICE DAILY WITH MEALS TO KEEP BLOOD COUNT UP (Patient taking differently: Take 325 mg by mouth 2 (two) times daily with a meal. TO KEEP BLOOD COUNT UP) 60 tablet 11  . furosemide (LASIX) 40 MG tablet Take 1 tablet (40 mg total) by mouth 2 (two) times daily. 180 tablet 3  . ibuprofen (ADVIL,MOTRIN) 200 MG tablet Take 200 mg by mouth every 6 (six) hours as needed.    [name]  metoprolol tartrate (LOPRESSOR) 25 MG tablet TAKE 1 TABLET BY MOUTH EVERY 6 HOURS AS NEEDED FOR HEART FLUTTERING 360 tablet 3  . nitroGLYCERIN (NITROSTAT) 0.4 MG SL tablet Place 1 tablet (0.4 mg total) under the tongue every 5 (five) minutes as needed for chest pain. 25 tablet 1  . omeprazole (PRILOSEC) 20 MG capsule Take 1 capsule (20 mg total) by mouth daily. 30 capsule 6  . polyethylene glycol (MIRALAX / GLYCOLAX) packet Take 17 g by mouth daily. (Patient taking differently: Take 17 g by mouth See admin instructions. Mix 1 capful (17 g) in a bottle of water and drink over the course of 2 days) 14 each 0  . potassium chloride (K-DUR) 10 MEQ tablet Take 10 mEq by mouth daily.     . predniSONE (DELTASONE) 20 MG tablet Take 1 tablet (20 mg total) by mouth daily with breakfast. 30 tablet 4  . senna (SENOKOT) 8.6 MG TABS tablet Take 1 tablet (8.6 mg total) by mouth at bedtime. 30 each 2   No current facility-administered medications for this visit.     Allergies:   Ace inhibitors and Other   Social History:  The patient  reports that she quit smoking about 6 years ago. Her smoking use  included Cigarettes. She has a 5.00 pack-year smoking history. She has never used smokeless tobacco. She reports that she does not drink alcohol or use drugs.   Family History:  The patient's family history includes Alcohol abuse in her brother and father; Aneurysm in her mother; CVA in her mother; Cancer in her brother; Colon cancer in her brother; Diabetes in her brother and sister; Heart attack in her brother, brother, and father; Heart disease in her daughter, mother, and sister; Hypertension in her brother, father, mother, and sister; Stroke in her father and mother.  ROS:  Please see the history of present illness.  All other systems are reviewed and otherwise negative.   PHYSICAL EXAM:  VS:  BP 123/75   Pulse 69   Ht 5\' 4"  (1.626 m)   Wt 143 lb (64.9 kg)   BMI 24.55 kg/m  BMI: Body mass index is 24.55 kg/m. Well nourished, well developed, in no acute distress  HEENT: normocephalic, atraumatic  Neck: no JVD, carotid bruits or masses (trach) Cardiac:  RRR (paced); no significant murmurs, no rubs, or gallops Lungs: CTA b/l, no wheezing, rhonchi or rales are appreciated today  Abd: soft, nontender MS: no deformity, age appropriate atrophy Ext: no edema  Skin: warm and dry, no rash Neuro:  No gross deficits appreciated Psych: euthymic mood, full affect  PPM site is stable, no tethering or discomfort   EKG:  Not done today 07/19/17 V paced PPM interrogation done today and reviewed by myself: battery and lead measurements are good, device dependent today  10/16/16: TTE Study Conclusions - Left ventricle: The cavity size was normal. Wall thickness was   increased in a pattern of mild LVH. There was moderate concentric   hypertrophy. Systolic function was normal. The estimated ejection   fraction was in the range of 55% to 60%. There was no dynamic   obstruction. Wall motion was normal; there were no regional wall   motion abnormalities. The study is not technically sufficient  to   allow evaluation of LV diastolic function. - Ventricular septum: Septal motion showed abnormal function,   dyssynergy, and paradox. These changes are consistent with right   ventricular pacing. - Mitral valve: There was mild to moderate regurgitation directed  centrally. - Left atrium: The atrium was moderately to severely dilated. - Pulmonary arteries: Systolic pressure was mildly increased. PA   peak pressure: 40 mm Hg (S).  08/19/16: R/LHC 1. Normal coronary arteries 2. Mild to moderate pulmonary HTN with normal PVR (mostly left-sided)  3. Prominent v-waves in PCWP tracing suggestive of severe diastolic dysfunction or significant mitral regurgitation  4. Normal LV function and cardiac output Plan/Discussion Continue medical therapy. Optimize diuretic regimen. Review echo to assess degree of MR.   Recent Labs: 11/13/2016: Magnesium 2.1 01/04/2017: B Natriuretic Peptide 298.0 01/26/2017: TSH 1.940 07/19/2017: ALT 45; BUN 66; Creatinine, Ser 1.65; Hemoglobin 9.0; Platelets 493; Potassium 5.3; Sodium 136  07/16/2017: Chol/HDL Ratio 3.5; Cholesterol, Total 168; HDL 48; LDL Calculated 99; Triglycerides 103   CrCl cannot be calculated (Patient's most recent lab result is older than the maximum 21 days allowed.).   Wt Readings from Last 3 Encounters:  09/02/17 143 lb (64.9 kg)  08/27/17 143 lb (64.9 kg)  08/24/17 141 lb 3.2 oz (64 kg)     Other studies reviewed: Additional studies/records reviewed today include: summarized above  ASSESSMENT AND PLAN:  1. PPM     Stable findings, no changes made  2. Permament Afib    CHA2DS2Vasc is at least 3, unable to maintain on a/c 2/2 recurrent GIB      3. HTN     Looks OK, no changes made  4. Tracheal stenosis      Follows with ENT  She mentions a slight cough, she has discussed this with her PMD and sees her ENT on Monday.  She is instructed that if this worsens or if she develops symptoms of fever/illness to seek attention sooner.   Lung fields are clear here today.   Disposition: F/u here in 6 months, sooner if needed.   Current medicines are reviewed at length with the patient today.  The patient did not have any concerns regarding medicines.  Venetia Night, PA-C 09/02/2017 1:43 PM     [address] [place] [address] [phone] (office)  [phone] (fax)

## 2017-09-02 ENCOUNTER — Ambulatory Visit (INDEPENDENT_AMBULATORY_CARE_PROVIDER_SITE_OTHER): Payer: Medicare Other | Admitting: Physician Assistant

## 2017-09-02 VITALS — BP 123/75 | HR 69 | Ht 64.0 in | Wt 143.0 lb

## 2017-09-02 DIAGNOSIS — Z950 Presence of cardiac pacemaker: Secondary | ICD-10-CM

## 2017-09-02 DIAGNOSIS — I10 Essential (primary) hypertension: Secondary | ICD-10-CM | POA: Diagnosis not present

## 2017-09-02 DIAGNOSIS — I482 Chronic atrial fibrillation: Secondary | ICD-10-CM

## 2017-09-02 DIAGNOSIS — K5904 Chronic idiopathic constipation: Secondary | ICD-10-CM

## 2017-09-02 DIAGNOSIS — I4821 Permanent atrial fibrillation: Secondary | ICD-10-CM

## 2017-09-02 NOTE — Patient Instructions (Signed)
Medication Instructions:   Your physician recommends that you continue on your current medications as directed. Please refer to the Current Medication list given to you today.   If you need a refill on your cardiac medications before your next appointment, please call your pharmacy.  Labwork: NONE ORDERED  TODAY   Testing/Procedures: NONE ORDERED  TODAY    Follow-Up:  Your physician wants you to follow-up in:  IN  [place] / [place]  You will receive a reminder letter in the mail two months in advance. If you don't receive a letter, please call our office to schedule the follow-up appointment.      Any Other Special Instructions Will Be Listed Below (If Applicable).

## 2017-09-07 DIAGNOSIS — J386 Stenosis of larynx: Secondary | ICD-10-CM | POA: Diagnosis not present

## 2017-09-07 DIAGNOSIS — Z430 Encounter for attention to tracheostomy: Secondary | ICD-10-CM | POA: Diagnosis not present

## 2017-09-07 DIAGNOSIS — G4733 Obstructive sleep apnea (adult) (pediatric): Secondary | ICD-10-CM | POA: Diagnosis not present

## 2017-09-10 DIAGNOSIS — Z930 Tracheostomy status: Secondary | ICD-10-CM | POA: Diagnosis not present

## 2017-09-10 DIAGNOSIS — J449 Chronic obstructive pulmonary disease, unspecified: Secondary | ICD-10-CM | POA: Diagnosis not present

## 2017-09-16 ENCOUNTER — Ambulatory Visit (INDEPENDENT_AMBULATORY_CARE_PROVIDER_SITE_OTHER): Payer: Medicare Other | Admitting: Internal Medicine

## 2017-09-16 ENCOUNTER — Ambulatory Visit
Admission: RE | Admit: 2017-09-16 | Discharge: 2017-09-16 | Disposition: A | Discharge status: Home / Self Care | Payer: Medicare Other | Source: Ambulatory Visit | Attending: Family Medicine | Admitting: Family Medicine

## 2017-09-16 ENCOUNTER — Encounter: Payer: Self-pay | Admitting: Internal Medicine

## 2017-09-16 VITALS — BP 130/82 | HR 73 | Temp 97.7°F | Wt 149.0 lb

## 2017-09-16 DIAGNOSIS — Z23 Encounter for immunization: Secondary | ICD-10-CM | POA: Diagnosis not present

## 2017-09-16 DIAGNOSIS — R05 Cough: Secondary | ICD-10-CM

## 2017-09-16 DIAGNOSIS — R059 Cough, unspecified: Secondary | ICD-10-CM

## 2017-09-16 DIAGNOSIS — I10 Essential (primary) hypertension: Secondary | ICD-10-CM

## 2017-09-16 MED ORDER — BENZONATATE 100 MG PO CAPS: 100.0000 mg | ORAL_CAPSULE | Freq: Two times a day (BID) | ORAL | 0 refills | Status: DC | PRN

## 2017-09-16 MED ORDER — FEXOFENADINE HCL 60 MG PO TABS: 60.0000 mg | ORAL_TABLET | Freq: Two times a day (BID) | ORAL | 0 refills | Status: DC

## 2017-09-16 NOTE — Patient Instructions (Signed)
I think your symptoms are allergy related.   Let's try allegra 1 tablet twice a day for allergies You can take tessalon as needed for cough   We will get a chest xray as well.

## 2017-09-16 NOTE — Progress Notes (Signed)
   [facility] Phone: [phone]   Date of Visit: 09/16/2017   HPI:  Cough and Shortness of Breath:  - patient reports of cough and shortness of breath for the past 3 days. Reports that sometimes she has yellow phlegm  - she also reports of some rhinorrhea, watery eyes/itchy eyes, some post-nasal drip  - she has been needing her albuterol more lately in the past few weeks. She might use it every 3 days or so on average - no fevers or chills - normal oral intake. She reports that her appetite has in fact improved and she has gained some weight  - no worsening lower extremity swelling. No orthopnea. She is taking Lasix 40mg  BID and urinating well with this dose.  - no sick contacts - she reports she feels that she has allergies but has not been on any medications for this  - no chest pain  ROS: See HPI.  [place]:  PMH: HTN  CHF COPD Afib, Complete Heart Block with pacer  OSA Tracheostomy  CKDIII  PHYSICAL EXAM: BP 130/82   Pulse 73   Temp 97.7 F (36.5 C) (Oral)   Wt 149 lb (67.6 kg)   SpO2 99%   BMI 25.58 kg/m  Gen: NAD, non-toxic appearing  HEENT: oropharynx normal, nasal turbinates pale bilaterally, sclerae clear,tracheostomy in place, no cervical lymphadenopathy Heart: systolic murmur, RRR Lungs: normal effort, CTAB  Neuro: grossly normal Ext: no lower extremity swelling   ASSESSMENT/PLAN:  Health maintenance:  - flu vaccine today  Cough: Patient is afebrile and has normal effort with lungs that are clear to auscultation. Oxygen saturation is normal. No sign of PNA. Symptoms most consistent with allergies.  With her history of PNA with her tracheostomy, will obtain CXR. Start Allegra 1 tab BID for allergies. Return precautions discussed. She has an appointment with PCP next week.   Smiley Houseman, MD PGY [address]

## 2017-09-25 ENCOUNTER — Encounter: Payer: Self-pay | Admitting: Family Medicine

## 2017-09-25 ENCOUNTER — Ambulatory Visit (INDEPENDENT_AMBULATORY_CARE_PROVIDER_SITE_OTHER): Payer: Self-pay | Admitting: Family Medicine

## 2017-09-25 VITALS — BP 100/60 | HR 70 | Temp 98.3°F | Ht 64.0 in | Wt 152.0 lb

## 2017-09-25 DIAGNOSIS — M353 Polymyalgia rheumatica: Secondary | ICD-10-CM

## 2017-09-25 DIAGNOSIS — R053 Chronic cough: Secondary | ICD-10-CM

## 2017-09-25 DIAGNOSIS — R05 Cough: Secondary | ICD-10-CM

## 2017-09-25 DIAGNOSIS — R634 Abnormal weight loss: Secondary | ICD-10-CM

## 2017-09-25 MED ORDER — BENZONATATE 100 MG PO CAPS: 100.0000 mg | ORAL_CAPSULE | Freq: Two times a day (BID) | ORAL | 2 refills | Status: DC | PRN

## 2017-09-25 NOTE — Progress Notes (Signed)
    Subjective:    Patient ID: [name], female    DOB: 1946-06-01, 71 y.o.   MRN: [id number]   CC: follow up cough, muscle pains  PMR- has appt with rheumatology on 10/29. Taking steroid 20 mg daily. Reports some muscle pains in lower legs and ?hand pain with decreased grip strength. She otherwise is doing well and feels much improved. Tolerating steroid well.  Cough- persistently has issues with trach, feeling like things are stuck in her through, coughing, feel like throat is dry. Follows with ENT monthly to assess ability to d/c trach. She is scared to get it out but also dislikes having it in. She recently had CXR which was negative. Patient frequently concerned she has pneumonia. Denies fevers or chills. Cough is non-productive.  Weight- appetite much improved, reports she grazes throughout the day. She is gaining weight, previously has been concerned about unintentional weight loss and poor appetite. She now expresses concern she is gaining too much weight. She follows with nutrition.  Smoking status reviewed- former smoker  Review of Systems- see HPI   Objective:  BP 100/60   Pulse 70   Temp 98.3 F (36.8 C) (Oral)   Ht 5\' 4"  (1.626 m)   Wt 152 lb (68.9 kg)   SpO2 98%   BMI 26.09 kg/m  Vitals and nursing note reviewed  General: well nourished, in no acute distress Cardiac: RRR, clear S1 and S2, no murmurs, rubs, or gallops Respiratory: clear to auscultation bilaterally, no increased work of breathing Extremities: no edema or cyanosis. Skin: warm and dry, no rashes noted Neuro: alert and oriented, no focal deficits  Assessment & Plan:    Polymyalgia rheumatica (HCC)  Stable, on 20mg  prednisone daily  -continue daily steroid -follow up w/ rheum as scheduled to determine length of steroids and/or possible additional medications   Chronic cough  Patient well appearing with stable vital signs, no concern for infection at this time. Patient with upper  airway symptoms chronically. Recent negative CXR.   -encouraged supportive measures -refilled tessalon to use as needed -follow with ENT as scheduled  Loss of weight  Improved- patient up 12 pounds. Improved appetite likely 2/2 to steroids  -reassured patient    Return in about 3 months (around 12/26/2017).   Lucila Maine, DO Family Medicine Resident PGY-2

## 2017-09-25 NOTE — Assessment & Plan Note (Signed)
  Patient well appearing with stable vital signs, no concern for infection at this time. Patient with upper airway symptoms chronically. Recent negative CXR.   -encouraged supportive measures -refilled tessalon to use as needed -follow with ENT as scheduled

## 2017-09-25 NOTE — Assessment & Plan Note (Signed)
  Stable, on 20mg  prednisone daily  -continue daily steroid -follow up w/ rheum as scheduled to determine length of steroids and/or possible additional medications

## 2017-09-25 NOTE — Patient Instructions (Signed)
   It was great seeing you today! I'm glad you're doing so well.   I'd like to see you back in 3 months, but if you need to come in sooner please call to make an appointment.  If you have questions or concerns please do not hesitate to call at [phone].  Lucila Maine, DO PGY-2, [facility] 09/25/2017 1:55 PM

## 2017-09-25 NOTE — Assessment & Plan Note (Signed)
  Improved- patient up 12 pounds. Improved appetite likely 2/2 to steroids  -reassured patient

## 2017-10-12 DIAGNOSIS — M353 Polymyalgia rheumatica: Secondary | ICD-10-CM | POA: Diagnosis not present

## 2017-10-12 DIAGNOSIS — M2550 Pain in unspecified joint: Secondary | ICD-10-CM | POA: Diagnosis not present

## 2017-10-12 DIAGNOSIS — R05 Cough: Secondary | ICD-10-CM | POA: Diagnosis not present

## 2017-10-12 DIAGNOSIS — R5382 Chronic fatigue, unspecified: Secondary | ICD-10-CM | POA: Diagnosis not present

## 2017-10-12 DIAGNOSIS — Z6826 Body mass index (BMI) 26.0-26.9, adult: Secondary | ICD-10-CM | POA: Diagnosis not present

## 2017-10-12 DIAGNOSIS — E663 Overweight: Secondary | ICD-10-CM | POA: Diagnosis not present

## 2017-10-15 ENCOUNTER — Ambulatory Visit: Payer: Medicare Other | Admitting: Family Medicine

## 2017-10-27 ENCOUNTER — Telehealth: Payer: Self-pay

## 2017-10-27 NOTE — Telephone Encounter (Signed)
Left message to remind patient that her appointment time had changed from 230 to 315 on 10/29/2017.[name]

## 2017-10-28 NOTE — Progress Notes (Signed)
   Subjective:    Patient ID: [name], female    DOB: 1946/11/21, 71 y.o.   MRN: [id number]   CC: cough + increased mucous   HPI: Cough w/ mucous production Patient today reports increased cough and mucous production x 1 week. States mucous is yellow/white in color and is thick. Patient reports that occasionally she will have a dry cough as well. Patient called the doctor on call at [facility] who informed her to come to [facility] clinic or go the ED. Patient endorses fever, chills, runny nose, ear itching, nausea, and sore throat. Patient denies muscle aches and vomiting. Patient has a history of tracheostomy in 2017. Patient has a PMHx of COPD and tobacco use, but quit 3-4 years ago.   Smoking status reviewed. Currently not smoking. Former tobacco use, quit 3-4 years ago.   Objective:  BP 92/62   Pulse 72   Temp 98.5 F (36.9 C) (Oral)   Ht 5\' 4"  (1.626 m)   Wt 156 lb 3.2 oz (70.9 kg)   SpO2 99%   BMI 26.81 kg/m  Vitals and nursing note reviewed  General: well nourished, in no acute distress HEENT: normocephalic, no scleral icterus or conjunctival pallor, no nasal discharge, moist mucous membranes, good dentition without erythema or discharge noted in posterior oropharynx Neck: supple, non-tender, without lymphadenopathy Cardiac: RRR, clear S1 and S2, no murmurs, rubs, or gallops Respiratory: crackles auscultated in right middle lung, no increased work of breathing Abdomen: soft, nontender, nondistended, no masses or organomegaly. Bowel sounds present Skin: warm and dry, no rashes noted Neuro: alert and oriented, no focal deficits  Assessment & Plan:   Cough Patient reports cough x 1week with associated mucous production. Given extensive PMHx of trach, COPD, CHF, etc. Will obtain 2 view CXR and order antibiotics. -CXR ordered at [facility] imaging, per patient preference. Patient aware and agreed to get x-ray today  -doxycycline 100mg  bid x7 days -follow up in  1 week if no improvement or sooner if symptoms persist or worsen   Return in about 1 week (around 11/05/2017), or if symptoms worsen or fail to improve.  [name] More, DO, PGY-1

## 2017-10-29 ENCOUNTER — Other Ambulatory Visit: Payer: Self-pay

## 2017-10-29 ENCOUNTER — Encounter: Payer: Self-pay | Admitting: Family Medicine

## 2017-10-29 ENCOUNTER — Ambulatory Visit
Admission: RE | Admit: 2017-10-29 | Discharge: 2017-10-29 | Disposition: A | Discharge status: Home / Self Care | Payer: Medicare Other | Source: Ambulatory Visit | Attending: Family Medicine | Admitting: Family Medicine

## 2017-10-29 ENCOUNTER — Ambulatory Visit (INDEPENDENT_AMBULATORY_CARE_PROVIDER_SITE_OTHER): Payer: Medicare Other | Admitting: Family Medicine

## 2017-10-29 VITALS — BP 92/62 | HR 72 | Temp 98.5°F | Ht 64.0 in | Wt 156.2 lb

## 2017-10-29 DIAGNOSIS — R059 Cough, unspecified: Secondary | ICD-10-CM

## 2017-10-29 DIAGNOSIS — R05 Cough: Secondary | ICD-10-CM

## 2017-10-29 MED ORDER — DOXYCYCLINE HYCLATE 100 MG PO TABS: 100.0000 mg | ORAL_TABLET | Freq: Two times a day (BID) | ORAL | 0 refills | Status: AC

## 2017-10-29 NOTE — Patient Instructions (Signed)
Cough, Adult A cough helps to clear your throat and lungs. A cough may last only 2-3 weeks (acute), or it may last longer than 8 weeks (chronic). Many different things can cause a cough. A cough may be a sign of an illness or another medical condition. Follow these instructions at home:  Pay attention to any changes in your cough.  Take medicines only as told by your doctor. ? If you were prescribed an antibiotic medicine, take it as told by your doctor. Do not stop taking it even if you start to feel better. ? Talk with your doctor before you try using a cough medicine.  Drink enough fluid to keep your pee (urine) clear or pale yellow.  If the air is dry, use a cold steam vaporizer or humidifier in your home.  Stay away from things that make you cough at work or at home.  If your cough is worse at night, try using extra pillows to raise your head up higher while you sleep.  Do not smoke, and try not to be around smoke. If you need help quitting, ask your doctor.  Do not have caffeine.  Do not drink alcohol.  Rest as needed. Contact a doctor if:  You have new problems (symptoms).  You cough up yellow fluid (pus).  Your cough does not get better after 2-3 weeks, or your cough gets worse.  Medicine does not help your cough and you are not sleeping well.  You have pain that gets worse or pain that is not helped with medicine.  You have a fever.  You are losing weight and you do not know why.  You have night sweats. Get help right away if:  You cough up blood.  You have trouble breathing.  Your heartbeat is very fast. This information is not intended to replace advice given to you by your health care provider. Make sure you discuss any questions you have with your health care provider. Document Released: 08/14/2011 Document Revised: 05/08/2016 Document Reviewed: 02/07/2015 Elsevier Interactive Patient Education  [employer].   It was a pleasure meeting you  today.   Today we discussed your cough. I have ordered a CXR to be done at [facility]. I would recommend you get this done today. I  Have also prescribed you doxycycline 100mg  to be taken twice a day for 7 days.   Please follow up in 7 days if you are not feeling better or sooner if symptoms persist or worsen. Please call the clinic immediately if you have concerns.   Our clinic's number is [phone]. Please call with questions or concerns.   Thank you,  [name], DO

## 2017-10-29 NOTE — Assessment & Plan Note (Signed)
Patient reports cough x 1week with associated mucous production. Given extensive PMHx of trach, COPD, CHF, etc. Will obtain 2 view CXR and order antibiotics. -CXR ordered at [facility] imaging, per patient preference. Patient aware and agreed to get x-ray today  -doxycycline 100mg  bid x7 days -follow up in 1 week if no improvement or sooner if symptoms persist or worsen

## 2017-10-30 ENCOUNTER — Encounter: Payer: Self-pay | Admitting: Family Medicine

## 2017-11-12 ENCOUNTER — Ambulatory Visit (INDEPENDENT_AMBULATORY_CARE_PROVIDER_SITE_OTHER): Payer: Medicare Other | Admitting: Family Medicine

## 2017-11-12 ENCOUNTER — Encounter: Payer: Self-pay | Admitting: Family Medicine

## 2017-11-12 DIAGNOSIS — N183 Chronic kidney disease, stage 3 unspecified: Secondary | ICD-10-CM

## 2017-11-12 NOTE — Patient Instructions (Addendum)
Go to your pharmacy, and show them the prednisone taper plan Leafy Kindle, PA gave you.  Starting on Jan 9, you will need a prescription for 1-mg tablets so you can dose properly.  Starting then, your dosage is 9 mg/day, which will be one 5-mg pill and four 1-mg pills.  Your pharmacist can check with Leafy Kindle, and confirm.    Goals: 1. Include at least 1 vegetable serving at both lunch and dinner at least 5 days a week.  The best vegetables for you are fresh or frozen.  The pickled vegetables are ok, but they have a lot of sodium, which is not great for your blood pressure.  (All canned foods tend to be high in sodium unless you get ones that specifically say there is no salt added.)  Limit canned or pickled vegetables to only once a day, but there is no limit to fresh or frozen vegetables.    2. Do your exercises at home at least 5 times a week.     - If it makes it easier, do them in front of the TV, and rest whenever you need to.         - Look into the exercise classes at [facility]: [phone].  - Your weight has reached a point where you don't need to gain more.  Because of this, it's a good idea to stop using the Ensure drink unless it is an emergency back-up plan for a meal replacement. (Whole, real food is always best!)    - In addition, it will be good to use a lower-fat milk, such as 2%, 1%, or even fat-free milk.      - Juices:  It is always better to EAT your fruit rather than to [facility] it.  Juices raises blood sugar a lot!   - Take advantage of some of the free foods in the fliers provided today.    - Call Dr. Jenne Campus if you need to change your follow-up nutrition appt on Jan 8 at 1:30 PM:  [phone].    Bring your Goals Sheet to follow-up!

## 2017-11-12 NOTE — Progress Notes (Signed)
Medical Nutrition Therapy:  Appt start time: 1430 end time:  7616. Gastroenterologist: Leta Jungling, MD PCP: Lucila Maine, DO  Daughter: Daiva Eves  NOTE: Poor reading level; daughter helps her with this.   Assessment:  Primary concerns today: CKD dietary management, BG control, and unintentional weight loss (N18.3, E11.8; R63.4).  Weight is up significantly in past few months.  Current BMI of >26 suggests it is time to halt weight gain.    Ms. Resendes said she has cut back on her intake of sweets; trying to keep fewer sweets in the house.  24-hr recall confirms that she is still consuming sweets.  Her daughter has bought some veg's, mostly pickled asparagus, pickled beets, turnip greens, canned green beans and corn, and it sounds like these canned and pickled veg's are the main types she is getting (high-Na).    I helped Ms. Maragh-Polk understand the prednisone taper provided by her Pulmonary PA yesterday.  She does not have the proper dosages of her prednisone Rx to follow the dosing as instructed.    Learning Readiness: Ready  Recent physical activity has been sporadic, but is mostly not doing exercises.  Did not look into ex classes at [facility].  (Was doing 5-10 min ~2 X wk exercises, either standing, holding onto her dresser, or while in bed in the morning.)     24-hr recall:  (Up at 8 AM) B (8 AM)-  1 1/2 c potato-cheese soup, 1/4 c oyster crackers, 1 c whole milk, 8 oz Ensure Snk ( AM)-  --- L (12 PM)-  1 c turnip greens, 2 oz calf liver, 1 c rice, 2 tbsp gravy, water Snk ( PM)-  1 small bag pork skins, water D (7 PM)-  1 c turnip greens, 2 oz calf liver, 1 c rice, 2 tbsp gravy, 1 pineapple slice, water Snk (9 PM)-  1 c corn chips, 4 small Snickers bars, 4 small Almond Joys, water Typical day? Yes.  except she doesn't usually eat liver; trying to avoid b/c of gout.    Progress Towards Goal(s):  In progress.   Nutritional Diagnosis:  Progress noted on: NI-1.4  Inadequate energy intake As related to energy requirements.  As evidenced by increased appetite and weight gain of 14 lb in past 2-3 months.    Intervention:  Nutrition education  Handouts given during visit include:  AVS, large print  Goals Sheet  Monitoring/Evaluation:  Dietary intake, exercise, and body weight in 6 week(s).

## 2017-11-13 IMAGING — DX DG CHEST 2V
2 series · 2 of 2 positions shown · non-contrast
Comparison: 11/25/2015

CLINICAL DATA: Dry cough and wheezing.

EXAM:
CHEST  2 VIEW

[chest pa]
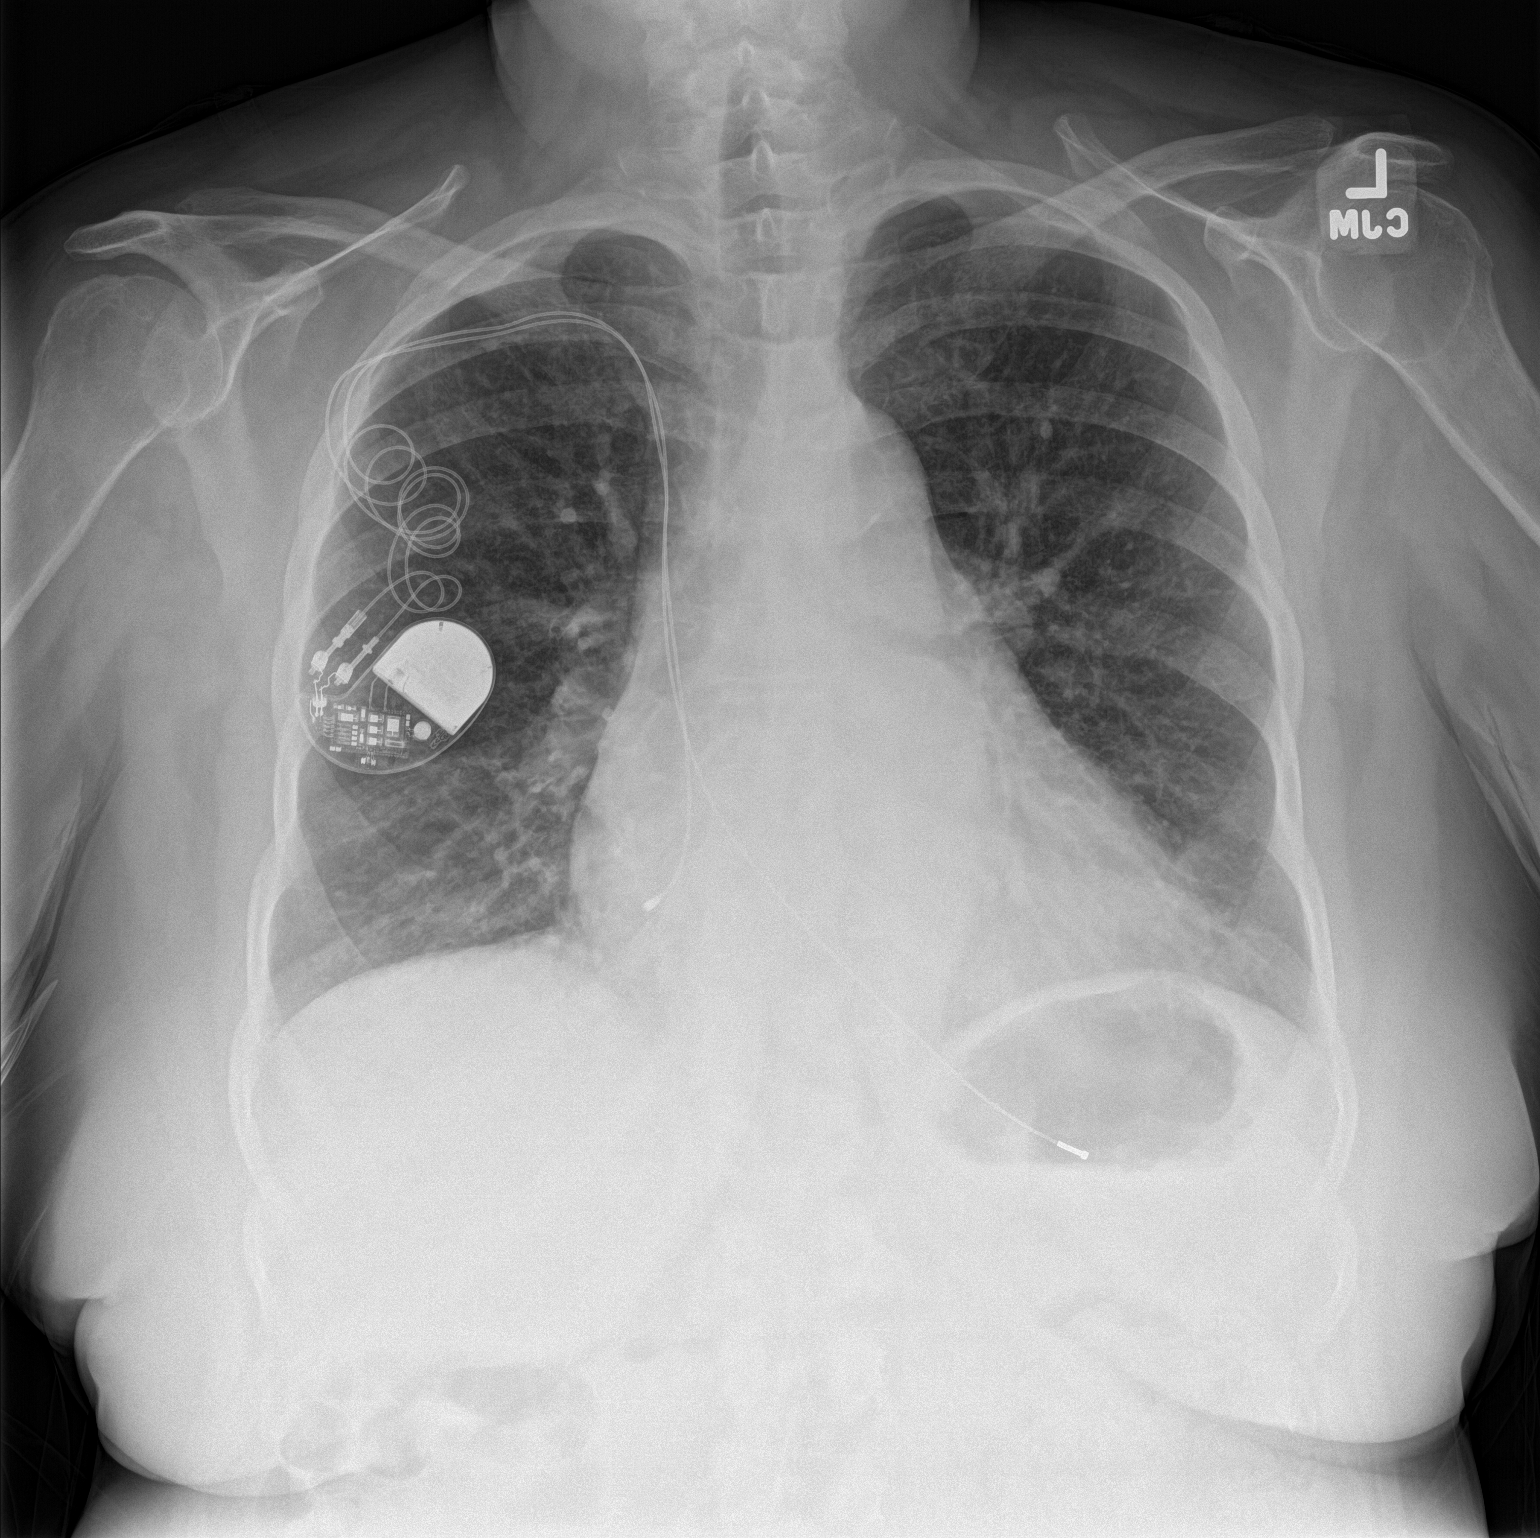

[chest lat]
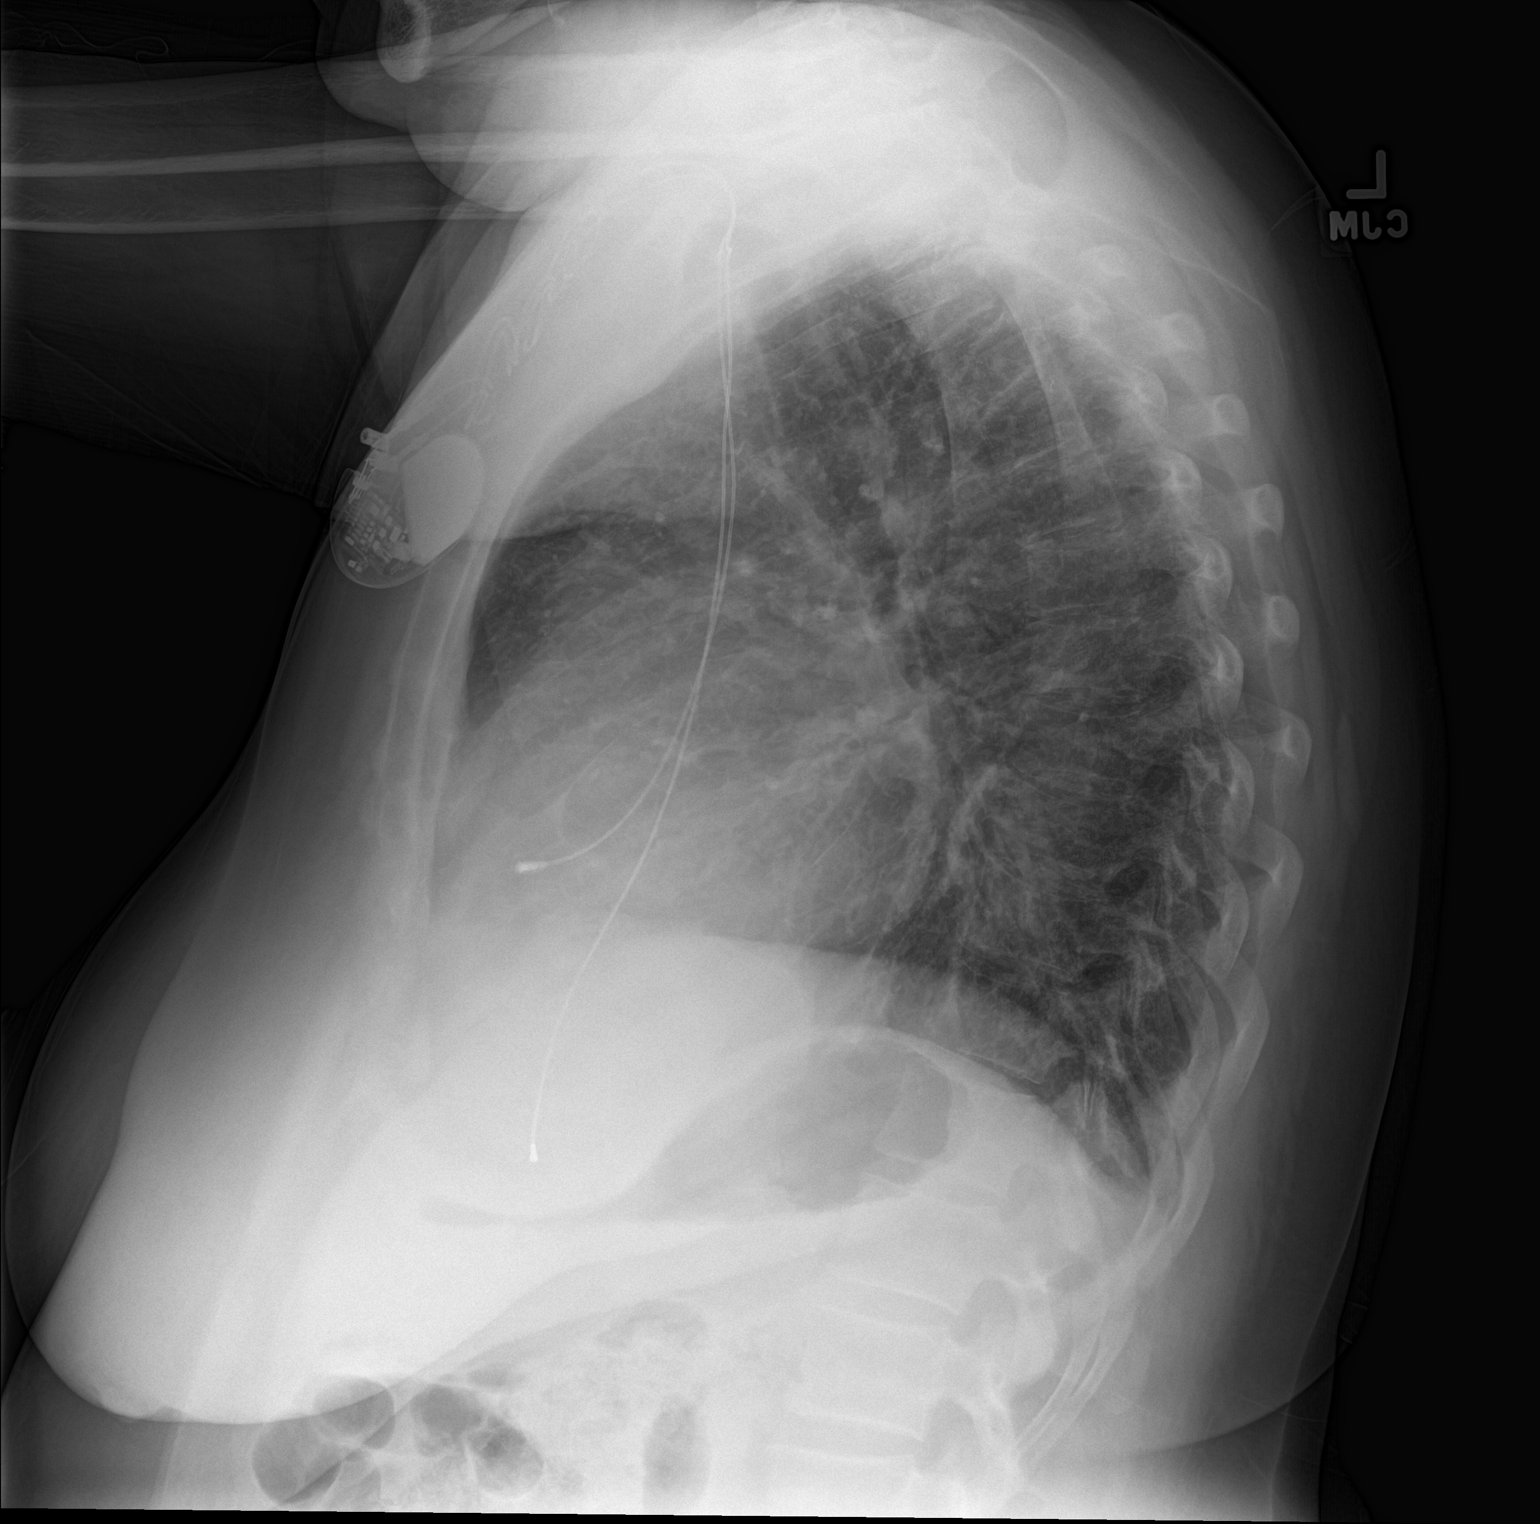

[2 of 2 positions shown; findings below may reference images not displayed]

FINDINGS: Right-sided pacemaker remains in place. Cardiomegaly again seen,
unchanged from prior. Improved pulmonary edema. No confluent
airspace disease. No pleural effusion or pneumothorax. Osseous
structures are intact.
IMPRESSION: Improved pulmonary edema.  Unchanged cardiomegaly

## 2017-11-18 ENCOUNTER — Other Ambulatory Visit: Payer: Self-pay

## 2017-11-18 ENCOUNTER — Encounter: Payer: Self-pay | Admitting: Family Medicine

## 2017-11-18 ENCOUNTER — Ambulatory Visit (INDEPENDENT_AMBULATORY_CARE_PROVIDER_SITE_OTHER): Payer: Medicare Other | Admitting: Family Medicine

## 2017-11-18 VITALS — BP 110/70 | HR 70 | Temp 97.7°F | Ht 64.0 in | Wt 158.0 lb

## 2017-11-18 DIAGNOSIS — R05 Cough: Secondary | ICD-10-CM

## 2017-11-18 DIAGNOSIS — R053 Chronic cough: Secondary | ICD-10-CM

## 2017-11-18 MED ORDER — GUAIFENESIN ER 600 MG PO TB12: 600.0000 mg | ORAL_TABLET | Freq: Two times a day (BID) | ORAL | 0 refills | Status: DC

## 2017-11-18 MED ORDER — CETIRIZINE HCL 10 MG PO TABS: 10.0000 mg | ORAL_TABLET | Freq: Every day | ORAL | 0 refills | Status: DC

## 2017-11-18 NOTE — Patient Instructions (Signed)
   It was great seeing you today!  Please take the allergy pill to help reduce mucous running down your throat Please take the mucinex to help thin mucous to cough it up easier.  If you get worse or start having fevers please return to be seen.  If you have questions or concerns please do not hesitate to call at [phone].  Lucila Maine, DO PGY-2, [facility] 11/18/2017 2:31 PM

## 2017-11-18 NOTE — Assessment & Plan Note (Signed)
  Patient with usual complaint of upper airway mucous and coughing. She is worried for pneumonia. She is well appearing, afebrile, with clear lungs. She was recently treated with a course of doxycycline and had a normal chest x-ray on 11/16. Suspect component of post nasal drip/allergies in combination with her anxiety about health and tracheostomy contributing to frequent visits for concerns of pneumonia  -rx for antihistamine and mucinex given to use over next 10 days -return precautions for fever, difficulty breathing -follow up with ENT as scheduled -return to be seen as needed

## 2017-11-18 NOTE — Progress Notes (Signed)
    Subjective:    Patient ID: [name], female    DOB: September 01, 1946, 71 y.o.   MRN: [id number]   CC: cough  Has been coughing for 3-4 days. Cough is: productive, sounds wet Sputum production: yellow Medications tried: none Taking blood pressure medications: tessalon- no relief, inhaler- no relief  Symptoms Runny nose: yes Mucous in back of throat: yes Throat burning or reflux: no Wheezing or asthma: no Fever: no Chest Pain: no Shortness of breath: no Leg swelling: no Hemoptysis: no Weight loss: no  ROS see HPI Smoking Status noted- former smoker  Objective:  BP 110/70   Pulse 70   Temp 97.7 F (36.5 C) (Oral)   Ht 5\' 4"  (1.626 m)   Wt 158 lb (71.7 kg)   SpO2 93%   BMI 27.12 kg/m  Vitals and nursing note reviewed  General: well nourished, in no acute distress HEENT: normocephalic, no scleral icterus or conjunctival pallor, moist mucous membranes, posterior oropharynx with cobblestoning, no erythema or discharge Neck: supple, non-tender, without lymphadenopathy, trach in place Cardiac: RRR, clear S1 and S2, no murmurs, rubs, or gallops Respiratory: clear to auscultation bilaterally, no increased work of breathing Extremities: no edema or cyanosis. Skin: warm and dry, no rashes noted Neuro: alert and oriented, no focal deficits Psych: mood is anxious. Affect appropriate.   Assessment & Plan:    Chronic cough  Patient with usual complaint of upper airway mucous and coughing. She is worried for pneumonia. She is well appearing, afebrile, with clear lungs. She was recently treated with a course of doxycycline and had a normal chest x-ray on 11/16. Suspect component of post nasal drip/allergies in combination with her anxiety about health and tracheostomy contributing to frequent visits for concerns of pneumonia  -rx for antihistamine and mucinex given to use over next 10 days -return precautions for fever, difficulty breathing -follow up with ENT as  scheduled -return to be seen as needed    Return if symptoms worsen or fail to improve.   Lucila Maine, DO Family Medicine Resident PGY-2

## 2017-11-24 ENCOUNTER — Telehealth: Payer: Self-pay | Admitting: *Deleted

## 2017-11-24 NOTE — Telephone Encounter (Signed)
Received message on nurse line from [place], RN Case Manager with [facility] [phone]) stating patient is taking mucinex and wants to know if she has fluid restrictions for CHF. Please call patient at [phone] to inform her if she has fluid restrictions or not. Hubbard Hartshorn, RN, BSN

## 2017-11-24 NOTE — Telephone Encounter (Signed)
Returned call to Ms. Lauderbaugh-Polk. No strict fluid restriction needed. She's drinking about 4 bottles of water a day. Denies LE swelling. If she has swelling in extremities she has lasix she can take. She is taking Mucinex and using humidifier at home as well as inhaler. No other questions/concerns.   Lucila Maine, DO PGY-2, [facility] 11/24/2017 5:43 PM

## 2017-12-04 ENCOUNTER — Emergency Department (HOSPITAL_COMMUNITY)
Admission: EM | Admit: 2017-12-04 | Discharge: 2017-12-05 | Disposition: A | Discharge status: Home / Self Care | Payer: Medicare Other | Attending: Emergency Medicine | Admitting: Emergency Medicine

## 2017-12-04 ENCOUNTER — Other Ambulatory Visit: Payer: Self-pay

## 2017-12-04 ENCOUNTER — Encounter (HOSPITAL_COMMUNITY): Payer: Self-pay | Admitting: *Deleted

## 2017-12-04 ENCOUNTER — Emergency Department (HOSPITAL_COMMUNITY): Payer: Medicare Other

## 2017-12-04 DIAGNOSIS — K8020 Calculus of gallbladder without cholecystitis without obstruction: Secondary | ICD-10-CM

## 2017-12-04 DIAGNOSIS — Z79899 Other long term (current) drug therapy: Secondary | ICD-10-CM | POA: Insufficient documentation

## 2017-12-04 DIAGNOSIS — J45909 Unspecified asthma, uncomplicated: Secondary | ICD-10-CM | POA: Diagnosis not present

## 2017-12-04 DIAGNOSIS — I130 Hypertensive heart and chronic kidney disease with heart failure and stage 1 through stage 4 chronic kidney disease, or unspecified chronic kidney disease: Secondary | ICD-10-CM | POA: Diagnosis not present

## 2017-12-04 DIAGNOSIS — Z87891 Personal history of nicotine dependence: Secondary | ICD-10-CM | POA: Diagnosis not present

## 2017-12-04 DIAGNOSIS — R778 Other specified abnormalities of plasma proteins: Secondary | ICD-10-CM

## 2017-12-04 DIAGNOSIS — D649 Anemia, unspecified: Secondary | ICD-10-CM | POA: Insufficient documentation

## 2017-12-04 DIAGNOSIS — R109 Unspecified abdominal pain: Secondary | ICD-10-CM | POA: Diagnosis present

## 2017-12-04 DIAGNOSIS — R7989 Other specified abnormal findings of blood chemistry: Secondary | ICD-10-CM

## 2017-12-04 DIAGNOSIS — I5032 Chronic diastolic (congestive) heart failure: Secondary | ICD-10-CM | POA: Insufficient documentation

## 2017-12-04 DIAGNOSIS — Z7982 Long term (current) use of aspirin: Secondary | ICD-10-CM | POA: Insufficient documentation

## 2017-12-04 DIAGNOSIS — N289 Disorder of kidney and ureter, unspecified: Secondary | ICD-10-CM

## 2017-12-04 DIAGNOSIS — J449 Chronic obstructive pulmonary disease, unspecified: Secondary | ICD-10-CM | POA: Diagnosis not present

## 2017-12-04 DIAGNOSIS — N183 Chronic kidney disease, stage 3 (moderate): Secondary | ICD-10-CM | POA: Insufficient documentation

## 2017-12-04 LAB — URINALYSIS, ROUTINE W REFLEX MICROSCOPIC
Bilirubin Urine: NEGATIVE
Glucose, UA: NEGATIVE mg/dL
Hgb urine dipstick: NEGATIVE
KETONES UR: NEGATIVE mg/dL
LEUKOCYTES UA: NEGATIVE
NITRITE: NEGATIVE
PH: 6 (ref 5.0–8.0)
PROTEIN: NEGATIVE mg/dL
Specific Gravity, Urine: 1.01 (ref 1.005–1.030)

## 2017-12-04 LAB — CBC
HEMATOCRIT: 34.3 % — AB (ref 36.0–46.0)
Hemoglobin: 10.7 g/dL — ABNORMAL LOW (ref 12.0–15.0)
MCH: 28.2 pg (ref 26.0–34.0)
MCHC: 31.2 g/dL (ref 30.0–36.0)
MCV: 90.5 fL (ref 78.0–100.0)
Platelets: 277 10*3/uL (ref 150–400)
RBC: 3.79 MIL/uL — AB (ref 3.87–5.11)
RDW: 15 % (ref 11.5–15.5)
WBC: 7.8 10*3/uL (ref 4.0–10.5)

## 2017-12-04 LAB — COMPREHENSIVE METABOLIC PANEL
ALT: 40 U/L (ref 14–54)
ANION GAP: 12 (ref 5–15)
AST: 98 U/L — AB (ref 15–41)
Albumin: 3.7 g/dL (ref 3.5–5.0)
Alkaline Phosphatase: 62 U/L (ref 38–126)
BILIRUBIN TOTAL: 1 mg/dL (ref 0.3–1.2)
BUN: 84 mg/dL — AB (ref 6–20)
CHLORIDE: 100 mmol/L — AB (ref 101–111)
CO2: 27 mmol/L (ref 22–32)
Calcium: 9.7 mg/dL (ref 8.9–10.3)
Creatinine, Ser: 2.06 mg/dL — ABNORMAL HIGH (ref 0.44–1.00)
GFR, EST AFRICAN AMERICAN: 26 mL/min — AB (ref >60)
GFR, EST NON AFRICAN AMERICAN: 23 mL/min — AB (ref >60)
Glucose, Bld: 211 mg/dL — ABNORMAL HIGH (ref 65–99)
POTASSIUM: 3.9 mmol/L (ref 3.5–5.1)
Sodium: 139 mmol/L (ref 135–145)
TOTAL PROTEIN: 7.3 g/dL (ref 6.5–8.1)

## 2017-12-04 LAB — LIPASE, BLOOD: Lipase: 31 U/L (ref 11–51)

## 2017-12-04 LAB — TROPONIN I: Troponin I: 0.16 ng/mL (ref <0.03)

## 2017-12-04 NOTE — ED Notes (Signed)
Pt vomited x 1 in room

## 2017-12-04 NOTE — ED Provider Notes (Signed)
[facility] EMERGENCY DEPARTMENT Provider Note   CSN: [id number] Arrival date & time: 12/04/17  2118     History   Chief Complaint Chief Complaint  Patient presents with  . Shortness of Breath  . Abdominal Pain    HPI [name] is a 71 y.o. female.  The history is provided by the patient.  She has a complicated history that includes angina, AV malformation of the colon, congestive heart failure, complete heart block status post pacemaker insertion, osteoarthritis, hyperlipidemia, hypertension, hypertrophic cardiomyopathy, atrial fibrillation, renal insufficiency, tracheostomy.  She comes in complaining of pain across her mid and upper abdomen with some radiation to the back and into the chest.  She rates pain at 9/10.  Nothing makes it better, nothing makes it worse.  She did vomit once and states pain did improve slightly after vomiting.  She denies fever or chills.  She has chronic constipation which is unchanged.  She relates her constipation to taking iron supplements.  She has chronic dyspnea which is unchanged.  She did see her ENT physician 2 days ago who changed her tracheostomy tube and breathing did seem to improve following that.  Past Medical History:  Diagnosis Date  . Anemia   . Angina   . Angioedema    2/2 ACE  . Arteriovenous malformation of stomach   . Arthritis   . Asthma   . AVM (arteriovenous malformation) of colon    small intestine; stomach  . Blood transfusion   . CHF (congestive heart failure) ([place])   . Complete heart block ([place])    s/p PPM 1998  . Complication of anesthesia    Difficult airway; anaphylaxis and swelling with propofol  . DDD (degenerative disc disease)   . Depression   . Diastolic heart failure   . Fatty liver 07/26/10  . GERD (gastroesophageal reflux disease)   . GI bleed   . Headache   . History of alcohol abuse Stopped Fall 2012  . History of tobacco use Quit Fall 2012  . Hx of cardiovascular  stress test    a. Lexiscan Myoview (10/15):  Small inferolateral and apical defect c/w scar and poss soft tissue attenuation, no ischemia, EF 42%  . Hx of colonic polyp 08/13/10   adenomatous  . Hx of colonoscopy   . Hyperlipidemia   . Hyperlipidemia   . Hypertension   . Hypertrophic cardiomyopathy ([place])    dx by Dr [name] 2009  . Iron deficiency anemia   . Myocardial infarction ([place])   . Panic attack   . Panic attacks   . Permanent atrial fibrillation ([place])   . Pneumonia   . Renal failure    baseline creatinine 1.6  . Right arm pain 01/08/2012  . RLS (restless legs syndrome)    Dx 06/2007  . Shortness of breath    sob on exertation  . Sleep apnea     Patient Active Problem List   Diagnosis Date Noted  . Chronic cough 09/25/2017  . History of colonic polyps   . Polyp of cecum   . Polyp of ascending colon   . Polyp of transverse colon   . Polyp of descending colon   . Polyp of rectum   . Moderate protein-calorie malnutrition ([place]) 06/02/2017  . Chronic idiopathic constipation 03/19/2017  . Loss of weight 01/26/2017  . 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 Permanent atrial fibrillation ([place])   . Laryngeal stenosis 09/18/2016  . Esophageal dysphagia   . Dyspnea on exertion   .  Polymyalgia rheumatica ([place]) 04/10/2015  . Low back pain 03/28/2015  . Hypertrophic cardiomyopathy ([place]) 02/15/2015  . Eczema 09/18/2014  . Environmental allergies 09/18/2014  . Pre-diabetes 11/26/2013  . COPD, moderate ([place]) 11/01/2013  . Cough 07/15/2013  . Complete heart block ([place]) 04/03/2011  . Anemia 02/17/2011  . AVM (arteriovenous malformation) 02/13/2011  . PACEMAKER-[place] 11/28/2010  . CHRONIC KIDNEY DISEASE STAGE III (MODERATE) 09/24/2010  . Acute on chronic congestive heart failure ([place]) 08/30/2010  . ARTHRITIS 07/24/2010  . Generalized anxiety disorder 07/23/2010  . Hyperlipidemia 02/11/2007  . Essential hypertension 02/11/2007  . APNEA, SLEEP 02/11/2007    Past Surgical  History:  Procedure Laterality Date  . CARDIAC CATHETERIZATION    . CARDIAC CATHETERIZATION N/A 08/19/2016   Procedure: Right/Left Heart Cath and Coronary Angiography;  Surgeon: Jolaine Artist, MD;  Location: [place] [facility];  Service: Cardiovascular;  Laterality: N/A;  . COLONOSCOPY WITH PROPOFOL N/A 06/16/2017   Procedure: COLONOSCOPY WITH PROPOFOL;  Surgeon: Mauri Pole, MD;  Location: [facility];  Service: Endoscopy;  Laterality: N/A;  . DIRECT LARYNGOSCOPY N/A 09/18/2016   Procedure: DIRECT LARYNGOSCOPY, BRONCHOSCOPY, REMOVAL OF INTUBATION GRANULOMA;  Surgeon: [name], MD;  Location: [facility];  Service: ENT;  Laterality: N/A;  . DIRECT LARYNGOSCOPY N/A 10/20/2016   Procedure: EXTUBATION AND FLEXIBLE LARYNGOSCOPE;  Surgeon: [name], MD;  Location: [place];  Service: ENT;  Laterality: N/A;  . DIRECT LARYNGOSCOPY N/A 10/29/2016   Procedure: DIRECT LARYNGOSCOPY;  Surgeon: [name], MD;  Location: [facility] OR;  Service: ENT;  Laterality: N/A;  . ESOPHAGOGASTRODUODENOSCOPY  12/23/2011   Procedure: ESOPHAGOGASTRODUODENOSCOPY (EGD);  Surgeon: [name], MD;  Location: [facility];  Service: Endoscopy;  Laterality: N/A;  . ESOPHAGOGASTRODUODENOSCOPY (EGD) WITH PROPOFOL N/A 06/16/2017   Procedure: ESOPHAGOGASTRODUODENOSCOPY (EGD) WITH PROPOFOL;  Surgeon: Mauri Pole, MD;  Location: [facility];  Service: Endoscopy;  Laterality: N/A;  . EXTUBATION (ENDOTRACHEAL) IN OR N/A 07/21/2016   Procedure: EXTUBATION (ENDOTRACHEAL) IN OR;  Surgeon: [name], MD;  Location: [place];  Service: ENT;  Laterality: N/A;  . GIVENS CAPSULE STUDY  12/23/2011   Procedure: GIVENS CAPSULE STUDY;  Surgeon: [name], MD;  Location: [facility];  Service: Endoscopy;  Laterality: N/A;  . MICROLARYNGOSCOPY WITH LASER N/A 10/16/2016   Procedure: MICRODIRECTLARYNGOSCOPY WITH LASER ABLATION AND KENLOG INJECTION;  Surgeon: [name], MD;  Location: [place];  Service: ENT;  Laterality: N/A;  .  PACEMAKER INSERTION  1998   [facility], most recent gen change by [name] 4/12  . TRACHEOSTOMY TUBE PLACEMENT N/A 10/29/2016   Procedure: TRACHEOSTOMY;  Surgeon: [name], MD;  Location: [place];  Service: ENT;  Laterality: N/A;  . TUBAL LIGATION  04/01/2000    OB History    No data available       Home Medications    Prior to Admission medications   Medication Sig Start Date End Date Taking? Authorizing Provider  acetaminophen (TYLENOL) 325 MG tablet Take 325-650 mg by mouth every 6 (six) hours as needed.     [provider]  albuterol (PROVENTIL HFA;VENTOLIN HFA) 108 (90 Base) MCG/ACT inhaler Inhale 1-2 puffs into the lungs every 6 (six) hours as needed for wheezing or shortness of breath. 08/11/17   Riccio, Angela C, DO  albuterol (PROVENTIL) (2.5 MG/3ML) 0.083% nebulizer solution Take 3 mLs (2.5 mg total) by nebulization every 6 (six) hours as needed for wheezing or shortness of breath. Patient not taking: Reported on 11/12/2017 06/26/17   Lucila Maine  C, DO  ALPRAZolam (XANAX) 0.5 MG tablet TAKE 1 TABLET BY MOUTH AT BEDTIME AS NEEDED FOR ANXIETY 08/26/17   Steve Rattler, DO  aspirin EC 81 MG tablet Take 81 mg by mouth daily.     [provider]  benzonatate (TESSALON) 100 MG capsule Take 1 capsule (100 mg total) by mouth 2 (two) times daily as needed for cough. 09/25/17   Steve Rattler, DO  bisacodyl (DULCOLAX) 10 MG suppository Place 10 mg rectally daily as needed for mild constipation.     [provider]  cetirizine (ZYRTEC) 10 MG tablet Take 1 tablet (10 mg total) by mouth daily. 11/18/17   Steve Rattler, DO  Cholecalciferol (VITAMIN D3) 5000 units CAPS Take 5,000 Units by mouth daily.    [provider]  ferrous sulfate 325 (65 FE) MG tablet TAKE 1 TABLET BY MOUTH TWICE DAILY WITH MEALS TO KEEP BLOOD COUNT UP Patient taking differently: Take 325 mg by mouth 2 (two) times daily with a meal. TO KEEP BLOOD COUNT UP 08/26/16   Lucila Maine C,  DO  furosemide (LASIX) 40 MG tablet Take 1 tablet (40 mg total) by mouth 2 (two) times daily. 08/26/17   Steve Rattler, DO  guaiFENesin (MUCINEX) 600 MG 12 hr tablet Take 1 tablet (600 mg total) by mouth 2 (two) times daily. 11/18/17   Steve Rattler, DO  ibuprofen (ADVIL,MOTRIN) 200 MG tablet Take 200 mg by mouth every 6 (six) hours as needed.    [provider]  metoprolol tartrate (LOPRESSOR) 25 MG tablet TAKE 1 TABLET BY MOUTH EVERY 6 HOURS AS NEEDED FOR HEART FLUTTERING 04/08/17   Allred, Jeneen Rinks, MD  nitroGLYCERIN (NITROSTAT) 0.4 MG SL tablet Place 1 tablet (0.4 mg total) under the tongue every 5 (five) minutes as needed for chest pain. Patient not taking: Reported on 11/12/2017 01/18/15   Eileen Stanford, PA-C  omeprazole (PRILOSEC) 20 MG capsule Take 1 capsule (20 mg total) by mouth daily. 08/26/16   Steve Rattler, DO  polyethylene glycol (MIRALAX / GLYCOLAX) packet Take 17 g by mouth daily. Patient taking differently: Take 17 g by mouth See admin instructions. Mix 1 capful (17 g) in a bottle of water and drink over the course of 2 days 07/26/16   Domenic Polite, MD  potassium chloride (K-DUR) 10 MEQ tablet Take 10 mEq by mouth daily.     [provider]  predniSONE (DELTASONE) 20 MG tablet Take 1 tablet (20 mg total) by mouth daily with breakfast. 08/11/17   Steve Rattler, DO  senna (SENOKOT) 8.6 MG TABS tablet Take 1 tablet (8.6 mg total) by mouth at bedtime. 03/19/17   Steve Rattler, DO    Family History Family History  Problem Relation Age of Onset  . Hypertension Mother   . Stroke Mother   . Heart disease Mother   . Aneurysm Mother   . CVA Mother   . Hypertension Father   . Stroke Father   . Heart attack Father   . Alcohol abuse Father   . Hypertension Sister   . Hypertension Brother   . Colon cancer Brother   . Cancer Brother   . Diabetes Sister   . Diabetes Brother   . Heart attack Brother   . Heart attack Brother   . Heart disease Sister     . Alcohol abuse Brother   . Heart disease Daughter     Social History Social History   Tobacco Use  .  Smoking status: Former Smoker    Packs/day: 0.10    Years: 50.00    Pack years: 5.00    Types: Cigarettes    Last attempt to quit: 08/16/2011    Years since quitting: 6.3  . Smokeless tobacco: Never Used  . Tobacco comment: quit 2012  Substance Use Topics  . Alcohol use: No    Alcohol/week: 0.0 oz    Comment: quit 08/16/2011  . Drug use: No     Allergies   Ace inhibitors and Other   Review of Systems Review of Systems  All other systems reviewed and are negative.    Physical Exam Updated Vital Signs BP 128/81 (BP Location: Right Arm)   Pulse 67   Temp 97.7 F (36.5 C) (Oral)   Resp 20   SpO2 99%   Physical Exam  Nursing note and vitals reviewed.  71 year old female, resting comfortably and in no acute distress. Vital signs are normal. Oxygen saturation is 99%, which is normal. Head is normocephalic and atraumatic. PERRLA, EOMI. Oropharynx is clear. Neck is nontender and supple without adenopathy or JVD.  Tracheostomy tube is in place. Back is nontender and there is no CVA tenderness. Lungs are clear without rales, wheezes, or rhonchi. Chest is nontender. Heart has regular rate and rhythm without murmur. Abdomen is soft, flat, with tenderness in the epigastric area and in the right upper quadrant with plus/minus Murphy sign.  There is no rebound or guarding.  There are no masses or hepatosplenomegaly and peristalsis is normoactive. Extremities have no cyanosis or edema, full range of motion is present. Skin is warm and dry without rash. Neurologic: Mental status is normal, cranial nerves are intact, there are no motor or sensory deficits.  ED Treatments / Results  Labs (all labs ordered are listed, but only abnormal results are displayed) Labs Reviewed  COMPREHENSIVE METABOLIC PANEL - Abnormal; Notable for the following components:      Result Value    Chloride 100 (*)    Glucose, Bld 211 (*)    BUN 84 (*)    Creatinine, Ser 2.06 (*)    AST 98 (*)    GFR calc non Af Amer 23 (*)    GFR calc Af Amer 26 (*)    All other components within normal limits  CBC - Abnormal; Notable for the following components:   RBC 3.79 (*)    Hemoglobin 10.7 (*)    HCT 34.3 (*)    All other components within normal limits  TROPONIN I - Abnormal; Notable for the following components:   Troponin I 0.16 (*)    All other components within normal limits  TROPONIN I - Abnormal; Notable for the following components:   Troponin I 0.17 (*)    All other components within normal limits  LIPASE, BLOOD  URINALYSIS, ROUTINE W REFLEX MICROSCOPIC    EKG  EKG Interpretation  Date/Time:  Friday December 04 2017 21:24:24 EST Ventricular Rate:  70 PR Interval:    QRS Duration: 208 QT Interval:  502 QTC Calculation: 542 R Axis:   -33 Text Interpretation:  Ventricular-paced rhythm Abnormal ECG When compared with ECG of 07/19/2017, No significant change was found Confirmed by Delora Fuel ([id number]) on 12/05/2017 12:19:11 AM       Radiology Dg Chest 2 View  Result Date: 12/04/2017 CLINICAL DATA:  71 year old female with shortness of breath. Abdominal pain. EXAM: CHEST  2 VIEW COMPARISON:  Chest radiograph dated 10/29/2017 FINDINGS: Tracheostomy tube above the carina  in similar position as before. The lungs are clear. There is no pleural effusion or pneumothorax. Stable mild cardiomegaly. No acute osseous pathology. Right pectoral pacemaker device. IMPRESSION: No active cardiopulmonary disease. Electronically Signed   By: Anner Crete M.D.   On: 12/04/2017 22:25   US Abdomen Complete  Result Date: 12/05/2017 CLINICAL DATA:  71 year old female with abdominal pain. EXAM: ABDOMEN ULTRASOUND COMPLETE COMPARISON:  Abdominal CT dated 07/19/2017 FINDINGS: Gallbladder: Small gallstones. No gallbladder wall thickening or pericholecystic fluid. Negative sonographic Murphy's  sign. Common bile duct: Diameter: 6 mm. Liver: The liver demonstrates a normal echotexture. Portal vein is patent on color Doppler imaging with normal direction of blood flow towards the liver. IVC: No abnormality visualized. Pancreas: Visualized portion unremarkable. Spleen: Size and appearance within normal limits. Right Kidney: Length: 10.3 cm. The right kidney is mildly atrophic. There is increased renal echogenicity. Multiple hypoechoic lesions measure up to 1.7 x 1.3 x 1.8 cm most likely cysts. No hydronephrosis or echogenic stone. Left Kidney: Length: 9.2 cm. The left kidney is echogenic and mildly atrophic. Multiple hypoechoic lesions, probable cysts measure up to 2.7 x 2.2 x 2.1 cm. No hydronephrosis or shadowing stone. Abdominal aorta: Poorly visualized. Other findings: None. IMPRESSION: 1. Cholelithiasis without sonographic evidence acute cholecystitis. 2. Echogenic and mildly atrophic kidneys. No hydronephrosis or shadowing stone. 3. Unremarkable liver. 4. Patent main portal vein with hepatopetal flow. Electronically Signed   By: Anner Crete M.D.   On: 12/05/2017 02:06    Procedures Procedures (including critical care time)  Medications Ordered in ED Medications  morphine 4 MG/ML injection 4 mg (4 mg Intravenous Given 12/05/17 0028)  ondansetron (ZOFRAN) injection 4 mg (4 mg Intravenous Given 12/05/17 0028)     Initial Impression / Assessment and Plan / ED Course  I have reviewed the triage vital signs and the nursing notes.  Pertinent labs & imaging results that were available during my care of the patient were reviewed by me and considered in my medical decision making (see chart for details).  Abdominal pain of uncertain cause.  Location of pain is suggestive of possible biliary colic.  She still has her gallbladder and will be sent for gallbladder ultrasound.  Troponin has come back mildly elevated, but it is noted that her troponin levels have been consistently elevated over  the last 4 years.  Pain is not really consistent with cardiac pain.  Will check delta troponin.  If this is not changing, we will not treat aggressively.  Moderate renal insufficiency is present and is slightly worse than baseline.  Mild anemia is present unchanged from baseline.  Mild elevation of AST is also unchanged from baseline.  Ultrasound is positive for cholelithiasis.  Repeat troponin is unchanged.  This does appear to be a chronic elevation and level is actually less than it had been recently.  I do not feel she needs any additional testing regarding her troponin.  She is discharged with prescriptions for oxycodone-acetaminophen and ondansetron and is referred to general surgery for evaluation for possible cholecystectomy.  Of note, with elevated troponin, likely will need cardiology clearance before any surgical procedure.  Final Clinical Impressions(s) / ED Diagnoses   Final diagnoses:  Calculus of gallbladder without cholecystitis without obstruction  Normochromic normocytic anemia  Renal insufficiency  Elevated troponin I level    ED Discharge Orders        Ordered    oxyCODONE-acetaminophen (PERCOCET) 5-325 MG tablet  Every 4 hours PRN     12/05/17 0344  ondansetron (ZOFRAN) 4 MG tablet  Every 6 hours PRN     45/14/60 [id number]       Delora Fuel, MD 87/21/58 [phone]

## 2017-12-04 NOTE — ED Notes (Signed)
Pt was still complaining of SOB. Pt was placed on 2lpm O2 by N/C

## 2017-12-04 NOTE — ED Notes (Signed)
Critical trop.called from lab.  Charge nurse Karolee Stamps, RN made aware.

## 2017-12-04 NOTE — ED Triage Notes (Signed)
The pt is c/o difficulty breathing since this am  Also c/o abd pain for one week  She saw her doctor yesterday who placed a new trach.  She is constipated  Her last bm was this am but ir was a hard stool

## 2017-12-05 ENCOUNTER — Emergency Department (HOSPITAL_COMMUNITY): Payer: Medicare Other

## 2017-12-05 DIAGNOSIS — K8020 Calculus of gallbladder without cholecystitis without obstruction: Secondary | ICD-10-CM | POA: Diagnosis not present

## 2017-12-05 LAB — TROPONIN I: Troponin I: 0.17 ng/mL (ref <0.03)

## 2017-12-05 MED ORDER — OXYCODONE-ACETAMINOPHEN 5-325 MG PO TABS: 1.0000 | ORAL_TABLET | ORAL | 0 refills | Status: DC | PRN

## 2017-12-05 MED ORDER — MORPHINE SULFATE (PF) 4 MG/ML IV SOLN
4.0000 mg | Freq: Once | INTRAVENOUS | Status: AC
  Administered 2017-12-05: 4 mg via INTRAVENOUS
  Filled 2017-12-05: qty 1

## 2017-12-05 MED ORDER — ONDANSETRON HCL 4 MG/2ML IJ SOLN
4.0000 mg | Freq: Once | INTRAMUSCULAR | Status: AC
  Administered 2017-12-05: 4 mg via INTRAVENOUS
  Filled 2017-12-05: qty 2

## 2017-12-05 MED ORDER — ONDANSETRON HCL 4 MG PO TABS: 4.0000 mg | ORAL_TABLET | Freq: Four times a day (QID) | ORAL | 0 refills | Status: DC | PRN

## 2017-12-05 NOTE — ED Notes (Signed)
Patient transported to Ultrasound 

## 2017-12-05 NOTE — Discharge Instructions (Signed)
Stay on a low fat diet. Return if pain comes back and is not controlled by the medication you have been given today. Call the surgeon to be evaluated for possible gallbladder surgery.

## 2017-12-15 NOTE — Progress Notes (Signed)
   [facility] Phone: [phone]   Date of Visit: 12/16/2017   HPI:  Patient was seen in the ED on 12/04/17 for mid and upper abdominal pain with radiation to the back and into chest. Improved slightly after vomiting. Symptoms thought to be possibly related to cholelithiasis. Troponin slightly elevated but has history of chronically elevated troponin. EKG ventricularly paced but no changes from prior. BMP with mild elevation of creatinine from baseline of about 1.7. CXR normal.   ED Follow up: symptomatic cholelithiasis - reports she has not had any symptoms since the day she went to the ED - we discussed about the recommendation of following up with surgery. Patient currently does not want to since she has not had further symptoms. We discussed that the recurrence of symptoms is unpredictable. She would like to manage with diet - her symptoms were mainly sharp pains in the epigastric region that radiate to the right flank. She did have associated nausea.   Cough:  - reports of productive cough for about 2-3 weeks.  - she has a tracheostomy and has been having yellow/dark mucous  - reports she has been wheezing as well  - no fevers  - she has been using albuterol more recently.    ROS: See HPI.  [place]:  PMH: Atrial Fibrillation  Complete Heart Block s/p pacemaker Hypertrophic Cardiomyopathy with CHF HTN CKD3 Pre-DM HLD COPD Laryngeal/Tracheal Stenosis s/p tracheostomy OSA GI AVM Colonic Polyps Arthritis Eczema Polymyalgia Rheumatica GAD  PHYSICAL EXAM: BP 104/64   Pulse 73   Temp 97.9 F (36.6 C) (Oral)   Wt 162 lb (73.5 kg)   SpO2 99%   BMI 27.81 kg/m  GEN: NAD CV: RRR, no murmurs, rubs, or gallops Abdomen: soft,NT, ND, normal bowel sounds.  PULM: CTAB, normal effort SKIN: No rash or cyanosis; warm and well-perfused EXTR: No lower extremity edema or calf tenderness PSYCH: Mood and affect euthymic, normal rate and volume of  speech NEURO: Awake, alert, no focal deficits grossly, normal speech   ASSESSMENT/PLAN:  Cough:  History is consistent with mild COPD exacerbation. She is well appearing and vitals are stable. Due to history of COPD, increased purulent mucous production, report of wheezing although none noted on exam today, will treat with Azithromycin and Prednisone 40mg  daily. CXR obtained in the ED was unremarkable and her lungs are clear to auscultation. Unlikely PNA. She is currently on 10mg  of Prednisone daily for polymyalgia rheumatica (followed by rheumatology); instructed her to hold this while she is on the 5 day course of higher dose prednisone then resume the low dose of prednisone after. Return precautions discussed. Follow up with PCP in 1 month.   Cholelithiasis:  Discussed treatment options. She would like to do diet management and hold off on surgery referral.   CKD3: elevated Creatinine noted on BMP at the ED. Repeat BMP today.    Smiley Houseman, MD PGY [place]

## 2017-12-16 ENCOUNTER — Ambulatory Visit (INDEPENDENT_AMBULATORY_CARE_PROVIDER_SITE_OTHER): Payer: Medicare Other | Admitting: Internal Medicine

## 2017-12-16 ENCOUNTER — Other Ambulatory Visit: Payer: Self-pay

## 2017-12-16 ENCOUNTER — Encounter: Payer: Self-pay | Admitting: Internal Medicine

## 2017-12-16 VITALS — BP 104/64 | HR 73 | Temp 97.9°F | Wt 162.0 lb

## 2017-12-16 DIAGNOSIS — J441 Chronic obstructive pulmonary disease with (acute) exacerbation: Secondary | ICD-10-CM | POA: Diagnosis not present

## 2017-12-16 DIAGNOSIS — N183 Chronic kidney disease, stage 3 unspecified: Secondary | ICD-10-CM

## 2017-12-16 DIAGNOSIS — K8020 Calculus of gallbladder without cholecystitis without obstruction: Secondary | ICD-10-CM

## 2017-12-16 MED ORDER — AZITHROMYCIN 250 MG PO TABS: ORAL_TABLET | ORAL | 0 refills | Status: DC

## 2017-12-16 MED ORDER — PREDNISONE 20 MG PO TABS: 40.0000 mg | ORAL_TABLET | Freq: Every day | ORAL | 0 refills | Status: AC

## 2017-12-16 NOTE — Patient Instructions (Addendum)
Cholelithiasis Cholelithiasis is also called "gallstones." It is a kind of gallbladder disease. The gallbladder is an organ that stores a liquid (bile) that helps you digest fat. Gallstones may not cause symptoms (may be silent gallstones) until they cause a blockage, and then they can cause pain (gallbladder attack). Follow these instructions at home:  Take over-the-counter and prescription medicines only as told by your doctor.  Stay at a healthy weight.  Eat healthy foods. This includes: ? Eating fewer fatty foods, like fried foods. ? Eating fewer refined carbs (refined carbohydrates). Refined carbs are breads and grains that are highly processed, like white bread and white rice. Instead, choose whole grains like whole-wheat bread and brown rice. ? Eating more fiber. Almonds, fresh fruit, and beans are healthy sources of fiber.  Keep all follow-up visits as told by your doctor. This is important. Contact a doctor if:  You have sudden pain in the upper right side of your belly (abdomen). Pain might spread to your right shoulder or your chest. This may be a sign of a gallbladder attack.  You feel sick to your stomach (are nauseous).  You throw up (vomit).  You have been diagnosed with gallstones that have no symptoms and you get: ? Belly pain. ? Discomfort, burning, or fullness in the upper part of your belly (indigestion). Get help right away if:  You have sudden pain in the upper right side of your belly, and it lasts for more than 2 hours.  You have belly pain that lasts for more than 5 hours.  You have a fever or chills.  You keep feeling sick to your stomach or you keep throwing up.  Your skin or the whites of your eyes turn yellow (jaundice).  You have dark-colored pee (urine).  You have light-colored poop (stool). Summary  Cholelithiasis is also called "gallstones."  The gallbladder is an organ that stores a liquid (bile) that helps you digest fat.  Silent  gallstones are gallstones that do not cause symptoms.  A gallbladder attack may cause sudden pain in the upper right side of your belly. Pain might spread to your right shoulder or your chest. If this happens, contact your doctor.  If you have sudden pain in the upper right side of your belly that lasts for more than 2 hours, get help right away. This information is not intended to replace advice given to you by your health care provider. Make sure you discuss any questions you have with your health care provider. Document Released: 05/19/2008 Document Revised: 08/17/2016 Document Reviewed: 08/17/2016 Elsevier Interactive Patient Education  2017 [employer].  We will treat your cough as a COPD flare: Take Azithromycin for 5 days and Prednisone 40mg  for 5 days.  Follow up with Dr. [name] in about 1 month.

## 2017-12-17 LAB — BASIC METABOLIC PANEL
BUN / CREAT RATIO: 39 — AB (ref 12–28)
BUN: 78 mg/dL (ref 8–27)
CO2: 25 mmol/L (ref 20–29)
Calcium: 10 mg/dL (ref 8.7–10.3)
Chloride: 97 mmol/L (ref 96–106)
Creatinine, Ser: 2.02 mg/dL — ABNORMAL HIGH (ref 0.57–1.00)
GFR calc Af Amer: 28 mL/min/{1.73_m2} — ABNORMAL LOW (ref >59)
GFR calc non Af Amer: 24 mL/min/{1.73_m2} — ABNORMAL LOW (ref >59)
GLUCOSE: 113 mg/dL — AB (ref 65–99)
POTASSIUM: 4.9 mmol/L (ref 3.5–5.2)
SODIUM: 139 mmol/L (ref 134–144)

## 2017-12-18 ENCOUNTER — Other Ambulatory Visit: Payer: Self-pay | Admitting: Internal Medicine

## 2017-12-18 ENCOUNTER — Telehealth: Payer: Self-pay | Admitting: Internal Medicine

## 2017-12-18 ENCOUNTER — Ambulatory Visit: Payer: Medicare Other | Admitting: Family Medicine

## 2017-12-18 DIAGNOSIS — N183 Chronic kidney disease, stage 3 unspecified: Secondary | ICD-10-CM

## 2017-12-18 NOTE — Telephone Encounter (Signed)
Patient scheduled for 12/22/17 at 12:30pm for her ultrasound.  She was informed and asked to drink water starting at 11:30am and continue until she gets her ultrasound. Jazmin Hartsell,CMA

## 2017-12-18 NOTE — Progress Notes (Signed)
Please schedule [place] for patient. Thank you

## 2017-12-18 NOTE — Telephone Encounter (Signed)
Called patient to discuss her creatinine and elevated BUN.   He has had an elevated BUN as least persistently since April 2018. Prior to this she had periods of elevated BUN. She has chronic kidney disease which seems to be slowly worsening over time. She does not have a nephrologist.   She drinks about 4-5 16oz bottles of water daily so unlikley this is due to dehydration. I would also not expect it to be chronically elevated in the setting of dehydration. She denies using NSAIDs. She does have a history of upper GI bleed but had an EGD done in 06/2017 which was normal. She reports of dark green stools for the past month or so but no black or bright red blood. She is on lasix but it does not look like dose has changed and she did not look hypovolemic on my exam at the appointment. She has no issues with urination.   Plan:  - I made an appointment for her with PCP on  12/24/16. I would recommend repeating CBC and obtaining FOBT. If FOBT is positive, I would refer her back to her GI physician - I will make a referral to nephrology due to her CKD. Additionally I will order a renal ultrasound so this would be available when she sees the nephrologist. I will enter this in an orders only entry.   I will route this note the the blue team staff. Please get US renal scheduled for patient. Thank you.

## 2017-12-22 ENCOUNTER — Ambulatory Visit (HOSPITAL_COMMUNITY)
Admission: RE | Admit: 2017-12-22 | Discharge: 2017-12-22 | Disposition: A | Discharge status: Home / Self Care | Payer: Medicare Other | Source: Ambulatory Visit | Attending: Family Medicine | Admitting: Family Medicine

## 2017-12-22 ENCOUNTER — Ambulatory Visit: Payer: Medicare Other | Admitting: Family Medicine

## 2017-12-22 DIAGNOSIS — N183 Chronic kidney disease, stage 3 unspecified: Secondary | ICD-10-CM

## 2017-12-22 DIAGNOSIS — R93421 Abnormal radiologic findings on diagnostic imaging of right kidney: Secondary | ICD-10-CM | POA: Diagnosis not present

## 2017-12-22 DIAGNOSIS — N281 Cyst of kidney, acquired: Secondary | ICD-10-CM | POA: Insufficient documentation

## 2017-12-22 DIAGNOSIS — R93422 Abnormal radiologic findings on diagnostic imaging of left kidney: Secondary | ICD-10-CM | POA: Insufficient documentation

## 2017-12-23 ENCOUNTER — Telehealth: Payer: Self-pay | Admitting: Internal Medicine

## 2017-12-23 NOTE — Telephone Encounter (Signed)
Called to inform patient about renal ultrasound. Questions answered.

## 2017-12-24 ENCOUNTER — Encounter: Payer: Self-pay | Admitting: Family Medicine

## 2017-12-24 ENCOUNTER — Other Ambulatory Visit: Payer: Self-pay

## 2017-12-24 ENCOUNTER — Ambulatory Visit (INDEPENDENT_AMBULATORY_CARE_PROVIDER_SITE_OTHER): Payer: Medicare Other | Admitting: Family Medicine

## 2017-12-24 VITALS — BP 121/80 | HR 70 | Temp 97.8°F | Wt 162.0 lb

## 2017-12-24 DIAGNOSIS — D500 Iron deficiency anemia secondary to blood loss (chronic): Secondary | ICD-10-CM | POA: Diagnosis not present

## 2017-12-24 DIAGNOSIS — N183 Chronic kidney disease, stage 3 unspecified: Secondary | ICD-10-CM

## 2017-12-24 DIAGNOSIS — K8020 Calculus of gallbladder without cholecystitis without obstruction: Secondary | ICD-10-CM | POA: Diagnosis not present

## 2017-12-24 NOTE — Assessment & Plan Note (Signed)
  Longstanding kidney disease with what appears to be a new baseline of 2.0, referral to nephrology is in process -follow up w/ nephro for new patient appointment

## 2017-12-24 NOTE — Assessment & Plan Note (Signed)
  Gallstones seen on RUQ [place] from 11/2017. Patient is asymptomatic. I discussed with her at length that unless the gallstones are causing frequent pain to leave them alone. She does not want a referral to surgery at this time. We agreed that if she experiences repeat "attacks" that we will refer to surgery in future and she can discuss cholecystectomy with surgeons.  -follow up as needed

## 2017-12-24 NOTE — Patient Instructions (Signed)
   It was great seeing you today!  I will let you know the results of your blood test to check on your anemia.  You will hear from [place] to schedule an appointment with the kidney doctor.  Please let me know if you continue to have pain related to your gallbladder and I am happy to refer you to a surgeon to discuss getting the gallbladder removed.  If you have questions or concerns please do not hesitate to call at [phone].  Lucila Maine, DO PGY-2, [facility] 12/24/2017 9:09 AM

## 2017-12-24 NOTE — Progress Notes (Signed)
    Subjective:    Patient ID: [name], female    DOB: 09/08/1946, 72 y.o.   MRN: [id number]   CC: follow up labs  [name] is here to follow up labs from her last appointment. She had elevated creatinine and BUN on labs. She was sent for renal US which she got yesterday. This showed some medical-renal kidney disease and bilateral simple cysts. Last provider was concerned about azotemia and asked her to come back in to ensure she does not have a GI bleed. [name] denies any blood in her stool. She takes iron supplementation so stools are always dark. She has had thorough GI work ups recently. She denies fatigue, palpitations. She does have DOE which is normal for her. She was unaware that her kidney function has been elevated for the past 6+ years but is agreeable to seeing the kidney doctors now that the function seems to have declined more.  Gallstones Was seen in ED 12/21 for RUQ abdominal pain that has since subsided. She had RUQ [place] that showed gallstones. She has not seen a Psychologist, sport and exercise. She is wondering what to do about the stones and if she will pass them on her own. She currently denies yellowing of skin, RUQ pain, fevers, chills, nausea, vomiting.   Smoking status reviewed- former smoker  Review of Systems- see HPI   Objective:  BP 121/80   Pulse 70   Temp 97.8 F (36.6 C) (Oral)   Wt 162 lb (73.5 kg)   SpO2 99%   BMI 27.81 kg/m  Vitals and nursing note reviewed  General: well nourished, in no acute distress Cardiac: RRR, clear S1 and S2, no murmurs, rubs, or gallops Respiratory: clear to auscultation bilaterally, no increased work of breathing Extremities: no edema or cyanosis. Skin: warm and dry, no rashes noted Neuro: alert and oriented, no focal deficits   Assessment & Plan:    Anemia  No overt GI bleed, patient taking iron. She has had thorough GI work up. Do not see an acute reason for referral back to GI. She has azotemia on labs secondary to  steroid usage. She is asymptomatic from an anemia standpoint -will check CBC today and follow up w/ patient regarding results  CHRONIC KIDNEY DISEASE STAGE III (MODERATE)  Longstanding kidney disease with what appears to be a new baseline of 2.0, referral to nephrology is in process -follow up w/ nephro for new patient appointment  Calculus of gallbladder without cholecystitis without obstruction  Gallstones seen on RUQ [place] from 11/2017. Patient is asymptomatic. I discussed with her at length that unless the gallstones are causing frequent pain to leave them alone. She does not want a referral to surgery at this time. We agreed that if she experiences repeat "attacks" that we will refer to surgery in future and she can discuss cholecystectomy with surgeons.  -follow up as needed     Return if symptoms worsen or fail to improve.   Lucila Maine, DO Family Medicine Resident PGY-2

## 2017-12-24 NOTE — Assessment & Plan Note (Signed)
  No overt GI bleed, patient taking iron. She has had thorough GI work up. Do not see an acute reason for referral back to GI. She has azotemia on labs secondary to steroid usage. She is asymptomatic from an anemia standpoint -will check CBC today and follow up w/ patient regarding results

## 2017-12-25 LAB — CBC
Hematocrit: 34.9 % (ref 34.0–46.6)
Hemoglobin: 10.8 g/dL — ABNORMAL LOW (ref 11.1–15.9)
MCH: 27.9 pg (ref 26.6–33.0)
MCHC: 30.9 g/dL — AB (ref 31.5–35.7)
MCV: 90 fL (ref 79–97)
PLATELETS: 324 10*3/uL (ref 150–379)
RBC: 3.87 x10E6/uL (ref 3.77–5.28)
RDW: 14.7 % (ref 12.3–15.4)
WBC: 5.8 10*3/uL (ref 3.4–10.8)

## 2017-12-28 ENCOUNTER — Telehealth: Payer: Self-pay | Admitting: Family Medicine

## 2017-12-28 NOTE — Telephone Encounter (Signed)
  LM for Ms. Forcier-Polk regarding her lab work. CBC was good; anemia is stable at her usual level. Asked her to call back with any questions. I see her 2/5 for follow up.  Lucila Maine, DO PGY-2, [facility] 12/28/2017 3:13 PM

## 2018-01-05 ENCOUNTER — Other Ambulatory Visit: Payer: Self-pay

## 2018-01-05 ENCOUNTER — Emergency Department (HOSPITAL_COMMUNITY): Payer: Medicare Other

## 2018-01-05 ENCOUNTER — Emergency Department (HOSPITAL_COMMUNITY)
Admission: EM | Admit: 2018-01-05 | Discharge: 2018-01-05 | Disposition: A | Discharge status: Home / Self Care | Payer: Medicare Other | Attending: Emergency Medicine | Admitting: Emergency Medicine

## 2018-01-05 ENCOUNTER — Encounter (HOSPITAL_COMMUNITY): Payer: Self-pay | Admitting: Emergency Medicine

## 2018-01-05 ENCOUNTER — Telehealth: Payer: Self-pay

## 2018-01-05 DIAGNOSIS — Z87891 Personal history of nicotine dependence: Secondary | ICD-10-CM | POA: Diagnosis not present

## 2018-01-05 DIAGNOSIS — J45909 Unspecified asthma, uncomplicated: Secondary | ICD-10-CM | POA: Insufficient documentation

## 2018-01-05 DIAGNOSIS — N183 Chronic kidney disease, stage 3 (moderate): Secondary | ICD-10-CM | POA: Insufficient documentation

## 2018-01-05 DIAGNOSIS — Z950 Presence of cardiac pacemaker: Secondary | ICD-10-CM | POA: Diagnosis not present

## 2018-01-05 DIAGNOSIS — R002 Palpitations: Secondary | ICD-10-CM | POA: Diagnosis present

## 2018-01-05 DIAGNOSIS — Z7982 Long term (current) use of aspirin: Secondary | ICD-10-CM | POA: Diagnosis not present

## 2018-01-05 DIAGNOSIS — R079 Chest pain, unspecified: Secondary | ICD-10-CM | POA: Diagnosis not present

## 2018-01-05 DIAGNOSIS — I130 Hypertensive heart and chronic kidney disease with heart failure and stage 1 through stage 4 chronic kidney disease, or unspecified chronic kidney disease: Secondary | ICD-10-CM | POA: Diagnosis not present

## 2018-01-05 DIAGNOSIS — R600 Localized edema: Secondary | ICD-10-CM | POA: Diagnosis not present

## 2018-01-05 DIAGNOSIS — Z79899 Other long term (current) drug therapy: Secondary | ICD-10-CM | POA: Diagnosis not present

## 2018-01-05 DIAGNOSIS — I5032 Chronic diastolic (congestive) heart failure: Secondary | ICD-10-CM | POA: Diagnosis not present

## 2018-01-05 LAB — BRAIN NATRIURETIC PEPTIDE: B Natriuretic Peptide: 175 pg/mL — ABNORMAL HIGH (ref 0.0–100.0)

## 2018-01-05 LAB — BASIC METABOLIC PANEL
Anion gap: 15 (ref 5–15)
BUN: 54 mg/dL — AB (ref 6–20)
CHLORIDE: 96 mmol/L — AB (ref 101–111)
CO2: 27 mmol/L (ref 22–32)
CREATININE: 1.89 mg/dL — AB (ref 0.44–1.00)
Calcium: 10 mg/dL (ref 8.9–10.3)
GFR calc Af Amer: 29 mL/min — ABNORMAL LOW (ref >60)
GFR, EST NON AFRICAN AMERICAN: 25 mL/min — AB (ref >60)
GLUCOSE: 125 mg/dL — AB (ref 65–99)
Potassium: 3.9 mmol/L (ref 3.5–5.1)
Sodium: 138 mmol/L (ref 135–145)

## 2018-01-05 LAB — I-STAT TROPONIN, ED
Troponin i, poc: 0.14 ng/mL (ref 0.00–0.08)
Troponin i, poc: 0.14 ng/mL (ref 0.00–0.08)

## 2018-01-05 LAB — CBC
HEMATOCRIT: 34.7 % — AB (ref 36.0–46.0)
Hemoglobin: 10.9 g/dL — ABNORMAL LOW (ref 12.0–15.0)
MCH: 28.5 pg (ref 26.0–34.0)
MCHC: 31.4 g/dL (ref 30.0–36.0)
MCV: 90.6 fL (ref 78.0–100.0)
PLATELETS: 290 10*3/uL (ref 150–400)
RBC: 3.83 MIL/uL — ABNORMAL LOW (ref 3.87–5.11)
RDW: 14.4 % (ref 11.5–15.5)
WBC: 4.3 10*3/uL (ref 4.0–10.5)

## 2018-01-05 NOTE — Discharge Instructions (Signed)
There did not appear to be any abnormalities with interrogation of the pacemaker.  Follow-up with your cardiologist tomorrow, as planned.  Return to the ED as needed.

## 2018-01-05 NOTE — ED Notes (Signed)
Informed RN Roselyn Reef of I-stat troponin 0.14

## 2018-01-05 NOTE — ED Triage Notes (Signed)
Pt states for the past month she has been having heart palpations intermittently. Pt states this has became more frequent and worse for the last few days. Pt currently has pain in the center of her chest with nausea. Pt also reports bilateral lower leg swelling for 1 week.

## 2018-01-05 NOTE — ED Provider Notes (Addendum)
[name] is a 72 y.o. female presenting to the ED with chest discomfort and palpitations for a month.  Patient presents to the emergency department with 1 month worth of palpitations and chest discomfort that is been worsening over the last few days.  The patient states that the palpitations have been intermittent.  She states nothing seemed to make the condition better or worse.  Patient states that she has not had any increasing shortness of breath.  The patient states that she did not take any new medications prior to arrival.  Patient states that she is also had some but she feels this congestion in the chest.  The patient denies  shortness of breath, headache,blurred vision, neck pain, fever,  weakness, numbness, dizziness, anorexia,  abdominal pain, nausea, vomiting, diarrhea, rash, back pain, dysuria, hematemesis, bloody stool, near syncope, or syncope.  Past Medical History:  Diagnosis Date  . Anemia   . Angina   . Angioedema    2/2 ACE  . Arteriovenous malformation of stomach   . Arthritis   . Asthma   . AVM (arteriovenous malformation) of colon    small intestine; stomach  . Blood transfusion   . CHF (congestive heart failure) ([place])   . Complete heart block ([place])    s/p PPM 1998  . Complication of anesthesia    Difficult airway; anaphylaxis and swelling with propofol  . DDD (degenerative disc disease)   . Depression   . Diastolic heart failure   . Fatty liver 07/26/10  . GERD (gastroesophageal reflux disease)   . GI bleed   . Headache   . History of alcohol abuse Stopped Fall 2012  . History of tobacco use Quit Fall 2012  . Hx of cardiovascular stress test    a. Lexiscan Myoview (10/15):  Small inferolateral and apical defect c/w scar and poss soft tissue attenuation, no ischemia, EF 42%  . Hx of colonic polyp 08/13/10   adenomatous  . Hx of colonoscopy   . Hyperlipidemia   . Hyperlipidemia   . Hypertension   . Hypertrophic cardiomyopathy ([place])    dx by Dr  [name] 2009  . Iron deficiency anemia   . Myocardial infarction ([place])   . Panic attack   . Panic attacks   . Permanent atrial fibrillation ([place])   . Pneumonia   . Renal failure    baseline creatinine 1.6  . Right arm pain 01/08/2012  . RLS (restless legs syndrome)    Dx 06/2007  . Shortness of breath    sob on exertation  . Sleep apnea     Physical Exam  BP 129/85   Pulse (!) 129   Resp 17   SpO2 100%   Physical Exam  Constitutional: She appears well-developed and well-nourished. No distress.  HENT:  Head: Normocephalic and atraumatic.  Eyes: Conjunctivae are normal. Pupils are equal, round, and reactive to light.  Neck: Neck supple.  Cardiovascular: Normal rate, regular rhythm, normal heart sounds and intact distal pulses.  Pulmonary/Chest: Effort normal and breath sounds normal. No tachypnea. No respiratory distress. She has no decreased breath sounds. She has no wheezes. She has no rhonchi. She has no rales.  No noted increased work of breathing.  Patient speaks in full sentences without difficulty.  Abdominal: Soft. There is no tenderness. There is no guarding.  Musculoskeletal: She exhibits no edema.       Right lower leg: She exhibits no edema.  Lymphadenopathy:    She has no cervical adenopathy.  Neurological: She is alert.  Skin: Skin is warm and dry. She is not diaphoretic. No cyanosis.  Psychiatric: She has a normal mood and affect. Her behavior is normal.  Nursing note and vitals reviewed.   ED Course/Procedures   Clinical Course as of Jan 07 40  Tue Jan 05, 2018  1940 RN Aaron Edelman states they were having trouble interrogating the patient's pacemaker with the equipment we have appear in the ED.  They made contact with [facility] who told them that the patient's pacemaker is too old to be read by the equipment we have here.  They are sending the rep to the ED with additional equipment to read patient's pacemaker.  [SJ]    Clinical Course User Index [SJ] Joy,  Shawn C, PA-C    Procedures   Labs Reviewed  BASIC METABOLIC PANEL - Abnormal; Notable for the following components:      Result Value   Chloride 96 (*)    Glucose, Bld 125 (*)    BUN 54 (*)    Creatinine, Ser 1.89 (*)    GFR calc non Af Amer 25 (*)    GFR calc Af Amer 29 (*)    All other components within normal limits  CBC - Abnormal; Notable for the following components:   RBC 3.83 (*)    Hemoglobin 10.9 (*)    HCT 34.7 (*)    All other components within normal limits  BRAIN NATRIURETIC PEPTIDE - Abnormal; Notable for the following components:   B Natriuretic Peptide 175.0 (*)    All other components within normal limits  I-STAT TROPONIN, ED - Abnormal; Notable for the following components:   Troponin i, poc 0.14 (*)    All other components within normal limits  I-STAT TROPONIN, ED   Dg Chest 2 View  Result Date: 01/05/2018 CLINICAL DATA:  Chest pain.  Cough and fever. EXAM: CHEST  2 VIEW COMPARISON:  12/04/2017. FINDINGS: Tracheostomy tube stable position. Cardiac pacer stable position. Cardiomegaly with normal pulmonary vascularity. No focal infiltrate. No pleural effusion or pneumothorax. No acute bony abnormality. IMPRESSION: 1. Tracheostomy tube noted stable position. No acute pulmonary disease. 2.  Cardiac pacer noted in stable position.  Stable cardiomegaly. Electronically Signed   By: Marcello Moores  Register   On: 01/05/2018 13:18   US Renal  Result Date: 12/22/2017 CLINICAL DATA:  72 year old hypertensive female chronic kidney disease stage 3. Initial encounter. EXAM: RENAL / URINARY TRACT ULTRASOUND COMPLETE COMPARISON:  07/19/2017 CT. FINDINGS: Right Kidney: Length: 9.4 cm. Increased echogenicity. No hydronephrosis. Renal cysts measuring up to 1.7 cm Left Kidney: Length: 10.1 cm. Increased echogenicity. No hydronephrosis. Renal cysts measuring up to 2.1 cm Bladder: Appears normal for degree of bladder distention. IMPRESSION: Bilateral increased renal parenchyma echogenicity  consistent with result of medical renal disease. Bilateral renal cysts largest measuring up to 2.1 cm. No hydronephrosis. Electronically Signed   By: Genia Del M.D.   On: 12/22/2017 16:20    MDM   With the patient symptoms ongoing for a month with no acute changes in her findings on EKG plus the patient has an appointment tomorrow morning with her cardiologist.  Patient has elevated troponins that are consistent with her previous.  The patient will need her pacemaker interrogated along with a second troponin.     Took patient care handoff report from [facility], PA-C. Plan: Second troponin pending.  Patient to undergo pacemaker interrogation.  If no acute abnormalities, discharge.  There was some delay in interrogating patient's  pacemaker, as noted in ED course.  No acute abnormalities noted on interrogation data.  Delta troponins stable.  Patient complaint-free at discharge.   Vitals:   01/05/18 1830 01/05/18 1900 01/05/18 2000 01/05/18 2100  BP: 118/70 105/71 104/65 111/76  Pulse: 72 70 70 70  Resp: 16 15 16 19   SpO2: 99% 100% 100% 99%      Dalia Heading, PA-C 01/09/18 [place], Kevin, MD 01/10/18 [phone]

## 2018-01-05 NOTE — ED Notes (Signed)
PT states understanding of care given, follow up care. PT ambulated from ED to car with a steady gait.  

## 2018-01-05 NOTE — ED Notes (Signed)
ED Provider at bedside. 

## 2018-01-05 NOTE — Telephone Encounter (Signed)
Late entry from 1155 this AM: Patient presented to the office (without appointment) and complained to check out staff of CP. Check out scheduled appointment tomorrow morning with EP. Check out called Triage, and writer spoke with patient.  Patient c/o intermittent stabbing CP that is becoming more and more frequent. She states she is also becoming more SOB. She has active CP now. Patient states she cannot wait until tomorrow for appointment as her symptoms are "very bad." Instructed patient to go to the ED for evaluation. She was grateful for assistance.

## 2018-01-05 NOTE — ED Notes (Signed)
Pt ambulated to RR with steady gait NAD

## 2018-01-05 NOTE — ED Provider Notes (Signed)
[facility] EMERGENCY DEPARTMENT Provider Note   CSN: [id number] Arrival date & time: 01/05/18  1205     History   Chief Complaint Chief Complaint  Patient presents with  . Palpitations  . Chest Pain    HPI [name] is a 72 y.o. female.  HPI Patient presents to the emergency department with 1 month worth of palpitations and chest discomfort that is been worsening over the last few days.  The patient states that the palpitations have been intermittent.  She states nothing seemed to make the condition better or worse.  Patient states that she has not had any increasing shortness of breath.  The patient states that she did not take any new medications prior to arrival.  Patient states that she is also had some but she feels this congestion in the chest.  The patient denies  shortness of breath, headache,blurred vision, neck pain, fever,  weakness, numbness, dizziness, anorexia,  abdominal pain, nausea, vomiting, diarrhea, rash, back pain, dysuria, hematemesis, bloody stool, near syncope, or syncope. Past Medical History:  Diagnosis Date  . Anemia   . Angina   . Angioedema    2/2 ACE  . Arteriovenous malformation of stomach   . Arthritis   . Asthma   . AVM (arteriovenous malformation) of colon    small intestine; stomach  . Blood transfusion   . CHF (congestive heart failure) ([place])   . Complete heart block ([place])    s/p PPM 1998  . Complication of anesthesia    Difficult airway; anaphylaxis and swelling with propofol  . DDD (degenerative disc disease)   . Depression   . Diastolic heart failure   . Fatty liver 07/26/10  . GERD (gastroesophageal reflux disease)   . GI bleed   . Headache   . History of alcohol abuse Stopped Fall 2012  . History of tobacco use Quit Fall 2012  . Hx of cardiovascular stress test    a. Lexiscan Myoview (10/15):  Small inferolateral and apical defect c/w scar and poss soft tissue attenuation, no ischemia, EF 42%  .  Hx of colonic polyp 08/13/10   adenomatous  . Hx of colonoscopy   . Hyperlipidemia   . Hyperlipidemia   . Hypertension   . Hypertrophic cardiomyopathy ([place])    dx by Dr [name] 2009  . Iron deficiency anemia   . Myocardial infarction ([place])   . Panic attack   . Panic attacks   . Permanent atrial fibrillation ([place])   . Pneumonia   . Renal failure    baseline creatinine 1.6  . Right arm pain 01/08/2012  . RLS (restless legs syndrome)    Dx 06/2007  . Shortness of breath    sob on exertation  . Sleep apnea     Patient Active Problem List   Diagnosis Date Noted  . Calculus of gallbladder without cholecystitis without obstruction 12/24/2017  . Chronic cough 09/25/2017  . History of colonic polyps   . Polyp of cecum   . Polyp of ascending colon   . Polyp of transverse colon   . Polyp of descending colon   . Polyp of rectum   . Moderate protein-calorie malnutrition ([place]) 06/02/2017  . Chronic idiopathic constipation 03/19/2017  . Loss of weight 01/26/2017  . 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 Permanent atrial fibrillation ([place])   . Laryngeal stenosis 09/18/2016  . Esophageal dysphagia   . Dyspnea on exertion   . Polymyalgia rheumatica ([place]) 04/10/2015  . Low back pain  03/28/2015  . Hypertrophic cardiomyopathy ([place]) 02/15/2015  . Eczema 09/18/2014  . Environmental allergies 09/18/2014  . Pre-diabetes 11/26/2013  . COPD, moderate ([place]) 11/01/2013  . Cough 07/15/2013  . Complete heart block ([place]) 04/03/2011  . Anemia 02/17/2011  . AVM (arteriovenous malformation) 02/13/2011  . PACEMAKER-[employer] 11/28/2010  . CHRONIC KIDNEY DISEASE STAGE III (MODERATE) 09/24/2010  . Acute on chronic congestive heart failure ([place]) 08/30/2010  . ARTHRITIS 07/24/2010  . Generalized anxiety disorder 07/23/2010  . Hyperlipidemia 02/11/2007  . Essential hypertension 02/11/2007  . APNEA, SLEEP 02/11/2007    Past Surgical History:  Procedure Laterality Date  . CARDIAC CATHETERIZATION    .  CARDIAC CATHETERIZATION N/A 08/19/2016   Procedure: Right/Left Heart Cath and Coronary Angiography;  Surgeon: Jolaine Artist, MD;  Location: [facility];  Service: Cardiovascular;  Laterality: N/A;  . COLONOSCOPY WITH PROPOFOL N/A 06/16/2017   Procedure: COLONOSCOPY WITH PROPOFOL;  Surgeon: Mauri Pole, MD;  Location: [facility];  Service: Endoscopy;  Laterality: N/A;  . DIRECT LARYNGOSCOPY N/A 09/18/2016   Procedure: DIRECT LARYNGOSCOPY, BRONCHOSCOPY, REMOVAL OF INTUBATION GRANULOMA;  Surgeon: [name], MD;  Location: [facility] OR;  Service: ENT;  Laterality: N/A;  . DIRECT LARYNGOSCOPY N/A 10/20/2016   Procedure: EXTUBATION AND FLEXIBLE LARYNGOSCOPE;  Surgeon: [name], MD;  Location: [facility];  Service: ENT;  Laterality: N/A;  . DIRECT LARYNGOSCOPY N/A 10/29/2016   Procedure: DIRECT LARYNGOSCOPY;  Surgeon: [name], MD;  Location: [facility] OR;  Service: ENT;  Laterality: N/A;  . ESOPHAGOGASTRODUODENOSCOPY  12/23/2011   Procedure: ESOPHAGOGASTRODUODENOSCOPY (EGD);  Surgeon: [name], MD;  Location: [facility];  Service: Endoscopy;  Laterality: N/A;  . ESOPHAGOGASTRODUODENOSCOPY (EGD) WITH PROPOFOL N/A 06/16/2017   Procedure: ESOPHAGOGASTRODUODENOSCOPY (EGD) WITH PROPOFOL;  Surgeon: Mauri Pole, MD;  Location: [facility];  Service: Endoscopy;  Laterality: N/A;  . EXTUBATION (ENDOTRACHEAL) IN OR N/A 07/21/2016   Procedure: EXTUBATION (ENDOTRACHEAL) IN OR;  Surgeon: [name], MD;  Location: [facility];  Service: ENT;  Laterality: N/A;  . GIVENS CAPSULE STUDY  12/23/2011   Procedure: GIVENS CAPSULE STUDY;  Surgeon: [name], MD;  Location: [facility];  Service: Endoscopy;  Laterality: N/A;  . MICROLARYNGOSCOPY WITH LASER N/A 10/16/2016   Procedure: MICRODIRECTLARYNGOSCOPY WITH LASER ABLATION AND KENLOG INJECTION;  Surgeon: [name], MD;  Location: [facility];  Service: ENT;  Laterality: N/A;  . PACEMAKER INSERTION  1998   [employer], most recent gen change by [name] 4/12  .  TRACHEOSTOMY TUBE PLACEMENT N/A 10/29/2016   Procedure: TRACHEOSTOMY;  Surgeon: [name], MD;  Location: [facility];  Service: ENT;  Laterality: N/A;  . TUBAL LIGATION  04/01/2000    OB History    No data available       Home Medications    Prior to Admission medications   Medication Sig Start Date End Date Taking? Authorizing Provider  acetaminophen (TYLENOL) 325 MG tablet Take 325-650 mg by mouth every 6 (six) hours as needed.    Yes [provider]  albuterol (PROVENTIL HFA;VENTOLIN HFA) 108 (90 Base) MCG/ACT inhaler Inhale 1-2 puffs into the lungs every 6 (six) hours as needed for wheezing or shortness of breath. 08/11/17  Yes Riccio, Angela C, DO  albuterol (PROVENTIL) (2.5 MG/3ML) 0.083% nebulizer solution Take 3 mLs (2.5 mg total) by nebulization every 6 (six) hours as needed for wheezing or shortness of breath. 06/26/17  Yes Steve Rattler, DO  allopurinol (ZYLOPRIM) 100 MG tablet Take 200 mg by mouth daily. 01/01/18  Yes [provider]  ALPRAZolam Duanne Moron) 0.5 MG tablet TAKE 1 TABLET BY MOUTH AT BEDTIME AS NEEDED FOR ANXIETY 08/26/17  Yes Steve Rattler, DO  aspirin EC 81 MG tablet Take 81 mg by mouth daily.    Yes [provider]  benzonatate (TESSALON) 100 MG capsule Take 1 capsule (100 mg total) by mouth 2 (two) times daily as needed for cough. 09/25/17  Yes Riccio, Angela C, DO  bisacodyl (DULCOLAX) 10 MG suppository Place 10 mg rectally daily as needed for mild constipation.    Yes [provider]  cetirizine (ZYRTEC) 10 MG tablet Take 1 tablet (10 mg total) by mouth daily. 11/18/17  Yes Steve Rattler, DO  Cholecalciferol (VITAMIN D3) 5000 units CAPS Take 5,000 Units by mouth daily.   Yes [provider]  ferrous sulfate 325 (65 FE) MG tablet TAKE 1 TABLET BY MOUTH TWICE DAILY WITH MEALS TO KEEP BLOOD COUNT UP Patient taking differently: Take 325 mg by mouth 2 (two) times daily with a meal. TO KEEP BLOOD COUNT UP 08/26/16  Yes  Riccio, Angela C, DO  furosemide (LASIX) 40 MG tablet Take 1 tablet (40 mg total) by mouth 2 (two) times daily. 08/26/17  Yes Riccio, Angela C, DO  metoprolol tartrate (LOPRESSOR) 25 MG tablet TAKE 1 TABLET BY MOUTH EVERY 6 HOURS AS NEEDED FOR HEART FLUTTERING 04/08/17  Yes Allred, Jeneen Rinks, MD  nitroGLYCERIN (NITROSTAT) 0.4 MG SL tablet Place 1 tablet (0.4 mg total) under the tongue every 5 (five) minutes as needed for chest pain. 01/18/15  Yes Eileen Stanford, PA-C  omeprazole (PRILOSEC) 20 MG capsule Take 1 capsule (20 mg total) by mouth daily. 08/26/16  Yes Riccio, Angela C, DO  ondansetron (ZOFRAN) 4 MG tablet Take 1 tablet (4 mg total) by mouth every 6 (six) hours as needed for nausea or vomiting. 46/50/35  Yes Delora Fuel, MD  oxyCODONE-acetaminophen (PERCOCET) 5-325 MG tablet Take 1 tablet by mouth every 4 (four) hours as needed for moderate pain. 46/56/81  Yes Delora Fuel, MD  polyethylene glycol [facility] / Floria Raveling) packet Take 17 g by mouth daily as needed.   Yes [provider]  potassium chloride (K-DUR) 10 MEQ tablet Take 10 mEq by mouth daily.    Yes [provider]  predniSONE (DELTASONE) 5 MG tablet Take 10 mg by mouth daily.  01/01/18  Yes [provider]  senna (SENOKOT) 8.6 MG TABS tablet Take 1 tablet by mouth daily as needed for mild constipation.   Yes [provider]    Family History Family History  Problem Relation Age of Onset  . Hypertension Mother   . Stroke Mother   . Heart disease Mother   . Aneurysm Mother   . CVA Mother   . Hypertension Father   . Stroke Father   . Heart attack Father   . Alcohol abuse Father   . Hypertension Sister   . Hypertension Brother   . Colon cancer Brother   . Cancer Brother   . Diabetes Sister   . Diabetes Brother   . Heart attack Brother   . Heart attack Brother   . Heart disease Sister   . Alcohol abuse Brother   . Heart disease Daughter     Social History Social History   Tobacco  Use  . Smoking status: Former Smoker    Packs/day: 0.10    Years: 50.00    Pack years: 5.00    Types: Cigarettes    Last  attempt to quit: 08/16/2011    Years since quitting: 6.3  . Smokeless tobacco: Never Used  . Tobacco comment: quit 2012  Substance Use Topics  . Alcohol use: No    Alcohol/week: 0.0 oz    Comment: quit 08/16/2011  . Drug use: No     Allergies   Ace inhibitors and Other   Review of Systems Review of Systems All other systems negative except as documented in the HPI. All pertinent positives and negatives as reviewed in the HPI.  Physical Exam Updated Vital Signs BP 129/85   Pulse (!) 129   Resp 17   SpO2 100%   Physical Exam  Constitutional: She is oriented to person, place, and time. She appears well-developed and well-nourished. No distress.  HENT:  Head: Normocephalic and atraumatic.  Mouth/Throat: Oropharynx is clear and moist.  Eyes: Pupils are equal, round, and reactive to light.  Neck: Normal range of motion. Neck supple.  Cardiovascular: Normal rate, regular rhythm and normal heart sounds. Exam reveals no gallop and no friction rub.  No murmur heard. Pulmonary/Chest: Effort normal and breath sounds normal. No respiratory distress. She has no wheezes.  Abdominal: Soft. Bowel sounds are normal. She exhibits no distension. There is no tenderness.  Musculoskeletal:       Right lower leg: She exhibits edema.       Left lower leg: She exhibits edema.  Neurological: She is alert and oriented to person, place, and time. She exhibits normal muscle tone. Coordination normal.  Skin: Skin is warm and dry. Capillary refill takes less than 2 seconds. No rash noted. No erythema.  Psychiatric: She has a normal mood and affect. Her behavior is normal.  Nursing note and vitals reviewed.    ED Treatments / Results  Labs (all labs ordered are listed, but only abnormal results are displayed) Labs Reviewed  BASIC METABOLIC PANEL - Abnormal; Notable for the  following components:      Result Value   Chloride 96 (*)    Glucose, Bld 125 (*)    BUN 54 (*)    Creatinine, Ser 1.89 (*)    GFR calc non Af Amer 25 (*)    GFR calc Af Amer 29 (*)    All other components within normal limits  CBC - Abnormal; Notable for the following components:   RBC 3.83 (*)    Hemoglobin 10.9 (*)    HCT 34.7 (*)    All other components within normal limits  I-STAT TROPONIN, ED - Abnormal; Notable for the following components:   Troponin i, poc 0.14 (*)    All other components within normal limits  BRAIN NATRIURETIC PEPTIDE  I-STAT TROPONIN, ED    EKG  EKG Interpretation None       Radiology Dg Chest 2 View  Result Date: 01/05/2018 CLINICAL DATA:  Chest pain.  Cough and fever. EXAM: CHEST  2 VIEW COMPARISON:  12/04/2017. FINDINGS: Tracheostomy tube stable position. Cardiac pacer stable position. Cardiomegaly with normal pulmonary vascularity. No focal infiltrate. No pleural effusion or pneumothorax. No acute bony abnormality. IMPRESSION: 1. Tracheostomy tube noted stable position. No acute pulmonary disease. 2.  Cardiac pacer noted in stable position.  Stable cardiomegaly. Electronically Signed   By: Marcello Moores  Register   On: 01/05/2018 13:18    Procedures Procedures (including critical care time)  Medications Ordered in ED Medications - No data to display   Initial Impression / Assessment and Plan / ED Course  I have reviewed the triage vital signs  and the nursing notes.  Pertinent labs & imaging results that were available during my care of the patient were reviewed by me and considered in my medical decision making (see chart for details).  Clinical Course as of Jan 10 1556  Tue Jan 05, 2018  1940 RN Aaron Edelman states they were having trouble interrogating the patient's pacemaker with the equipment we have appear in the ED.  They made contact with [facility] who told them that the patient's pacemaker is too old to be read by the equipment we have here.   They are sending the rep to the ED with additional equipment to read patient's pacemaker.  [SJ]    Clinical Course User Index [SJ] Joy, Shawn C, PA-C    With the patient symptoms ongoing for a month with no acute changes in her findings on EKG plus the patient has an appointment tomorrow morning with her cardiologist.  Patient has elevated troponins that are consistent with her previous.  Final Clinical Impressions(s) / ED Diagnoses   Final diagnoses:  None    ED Discharge Orders    None       Dalia Heading, PA-C 01/05/18 1647    Dalia Heading, PA-C 01/05/18 1651    Zaria Taha, Harrell Gave, PA-C 01/09/18 1557    Jola Schmidt, MD 01/10/18 [phone]

## 2018-01-05 NOTE — ED Notes (Signed)
[employer] pacemaker being interrogated

## 2018-01-05 NOTE — ED Notes (Signed)
This RN spoke with [employer] representative concerning inability to interrogate device.  Patient has a device that is not able to be interrogated by the [facility] at home device which is the only [employer] device we have in the emergency department.    [employer] is sending a representative to interrogate device in the emergency department.  This RN spoke with rep and they are on the way to the hospital at this time.

## 2018-01-06 ENCOUNTER — Ambulatory Visit: Payer: Medicare Other | Admitting: Nurse Practitioner

## 2018-01-19 ENCOUNTER — Ambulatory Visit
Admission: RE | Admit: 2018-01-19 | Discharge: 2018-01-19 | Disposition: A | Discharge status: Home / Self Care | Payer: Medicare Other | Source: Ambulatory Visit | Attending: Family Medicine | Admitting: Family Medicine

## 2018-01-19 ENCOUNTER — Ambulatory Visit (INDEPENDENT_AMBULATORY_CARE_PROVIDER_SITE_OTHER): Payer: Medicare Other | Admitting: Family Medicine

## 2018-01-19 ENCOUNTER — Other Ambulatory Visit: Payer: Self-pay

## 2018-01-19 VITALS — BP 110/60 | HR 77 | Temp 98.0°F | Ht 64.0 in | Wt 165.0 lb

## 2018-01-19 DIAGNOSIS — M25552 Pain in left hip: Secondary | ICD-10-CM | POA: Diagnosis not present

## 2018-01-19 MED ORDER — CEFDINIR 250 MG/5ML PO SUSR: 300.0000 mg | Freq: Two times a day (BID) | ORAL | 0 refills | Status: AC

## 2018-01-19 NOTE — Patient Instructions (Addendum)
I have ordered an X-ray of your hip. Please go to [facility] to get this done. I will call you results.   If you have questions or concerns please do not hesitate to call at [phone].  Lucila Maine, DO PGY-2, [facility] 01/19/2018 2:09 PM   Arthritis Arthritis is a term that is commonly used to refer to joint pain or joint disease. There are more than 100 types of arthritis. What are the causes? The most common cause of this condition is wear and tear of a joint. Other causes include:  Gout.  Inflammation of a joint.  An infection of a joint.  Sprains and other injuries near the joint.  A drug reaction or allergic reaction.  In some cases, the cause may not be known. What are the signs or symptoms? The main symptom of this condition is pain in the joint with movement. Other symptoms include:  Redness, swelling, or stiffness at a joint.  Warmth coming from the joint.  Fever.  Overall feeling of illness.  How is this diagnosed? This condition may be diagnosed with a physical exam and tests, including:  Blood tests.  Urine tests.  Imaging tests, such as MRI, X-rays, or a CT scan.  Sometimes, fluid is removed from a joint for testing. How is this treated? Treatment for this condition may involve:  Treatment of the cause, if it is known.  Rest.  Raising (elevating) the joint.  Applying cold or hot packs to the joint.  Medicines to improve symptoms and reduce inflammation.  Injections of a steroid such as cortisone into the joint to help reduce pain and inflammation.  Depending on the cause of your arthritis, you may need to make lifestyle changes to reduce stress on your joint. These changes may include exercising more and losing weight. Follow these instructions at home: Medicines  Take over-the-counter and prescription medicines only as told by your health care provider.  Do not take aspirin to relieve pain if gout is  suspected. Activity  Rest your joint if told by your health care provider. Rest is important when your disease is active and your joint feels painful, swollen, or stiff.  Avoid activities that make the pain worse. It is important to balance activity with rest.  Exercise your joint regularly with range-of-motion exercises as told by your health care provider. Try doing low-impact exercise, such as: ? Swimming. ? Water aerobics. ? Biking. ? Walking. Joint Care   If your joint is swollen, keep it elevated if told by your health care provider.  If your joint feels stiff in the morning, try taking a warm shower.  If directed, apply heat to the joint. If you have diabetes, do not apply heat without permission from your health care provider. ? Put a towel between the joint and the hot pack or heating pad. ? Leave the heat on the area for 20-30 minutes.  If directed, apply ice to the joint: ? Put ice in a plastic bag. ? Place a towel between your skin and the bag. ? Leave the ice on for 20 minutes, 2-3 times per day.  Keep all follow-up visits as told by your health care provider. This is important. Contact a health care provider if:  The pain gets worse.  You have a fever. Get help right away if:  You develop severe joint pain, swelling, or redness.  Many joints become painful and swollen.  You develop severe back pain.  You develop severe weakness  in your leg.  You cannot control your bladder or bowels. This information is not intended to replace advice given to you by your health care provider. Make sure you discuss any questions you have with your health care provider. Document Released: 01/08/2005 Document Revised: 05/08/2016 Document Reviewed: 02/26/2015 Elsevier Interactive Patient Education  [employer].

## 2018-01-19 NOTE — Progress Notes (Signed)
    Subjective:    Patient ID: [name], female    DOB: 1946-08-30, 72 y.o.   MRN: [id number]   CC: follow up ED visit  Was seen by ENT 1/31 and given omnicef to take for green sputum production. This has improved since then but she is having trouble swallowing the large pills. She is requesting liquid form of the medicine. No fevers, chills, shortness of breath, nausea, vomiting, or diarrhea.  Went to ED for chest pain 1/22. Work up was negative and she is seeing cardiology on Thursday. She reports the pain feels like a fluttering in her chest. She has a pacemaker. She feels this randomly, intermittently regardless of activity. She has no associated shortness of breath with these episodes. Resolves on its own.   L hip pain- started last Wednesday upon waking up, left hip had a sharp pain in it. Worse with walking. She has tried rubbing some ointment into it and this provides some relief. Resting helps as well. The pain is not like her typical aching pain associated with PMR. She has known arthritis in her R hip, spine, and knees. She does not take any pain medications really as she is afraid of interactions with her other meds. She denies trauma or fall.   Smoking status reviewed- former smoker  Review of Systems- see HPI   Objective:  BP 110/60   Pulse 77   Temp 98 F (36.7 C) (Oral)   Ht 5\' 4"  (1.626 m)   Wt 165 lb (74.8 kg)   SpO2 98%   BMI 28.32 kg/m  Vitals and nursing note reviewed  General: well nourished, in no acute distress Cardiac: RRR, clear S1 and S2, no murmurs, rubs, or gallops Respiratory: clear to auscultation bilaterally, no increased work of breathing Neuro: alert and oriented, no focal deficits  Assessment & Plan:   1. Left hip pain Degenerative changes seen on xray of R hip in jan 2016. Likely arthritis, doubt related to PMR. Patient on 10 mg prednisone daily. Has follow up with rheum in about 6 weeks. Will get Xray of hip to rule out occult  fx, rule in arthritis. Advised rest, ice or heat for pain. Tylenol as needed. Will call patient with results of Xray and go from there. She has stated adamantly she does not want injection into hip or hip replacement. - DG HIP UNILAT WITH PELVIS 2-3 VIEWS LEFT; Future  2. Intermittent Chest discomfort Does not sound consistent with ACS. Patient has follow up with Cards this week. Advised her to discuss this with them.   3. Increased sputum production Switched pill cefdinir to liquid form for ease of taking this medication. No concern for pneumonia. This medication was started by her ENT on 1/31.  Return if symptoms worsen or fail to improve.   Lucila Maine, DO Family Medicine Resident PGY-2

## 2018-01-20 ENCOUNTER — Telehealth: Payer: Self-pay | Admitting: Family Medicine

## 2018-01-20 DIAGNOSIS — M87052 Idiopathic aseptic necrosis of left femur: Secondary | ICD-10-CM

## 2018-01-20 NOTE — Telephone Encounter (Signed)
Call pt , has AVN , make her appt and I can see her after I am back in town thanks

## 2018-01-20 NOTE — Telephone Encounter (Signed)
Appt made

## 2018-01-20 NOTE — Progress Notes (Signed)
PCP: Steve Rattler, DO Primary Cardiologist:  Dr Oval Linsey Electrophysiologist: Allred  Nancee Brownrigg is a 72 y.o. female who presents today for electrophysiology followup.   She is seen today for routine EP follow up. She reports doing relatively well since last seen in clinic.  Today, she denies symptoms of lower extremity edema, dizziness, presyncope, or syncope.  The patient is otherwise without complaint today. She has chronic atypical chest pain that occurs at rest and last for seconds.  She does not have symptoms with exertion.  She is working with Dr Erik Obey on downsizing trach. Her weight has stabilized and appetite has improved. She has been referred to orthopedics for possible avascular necrosis seen on recent hip x-ray.   Past Medical History:  Diagnosis Date  . Anemia   . Angina   . Angioedema    2/2 ACE  . Arteriovenous malformation of stomach   . Arthritis   . Asthma   . AVM (arteriovenous malformation) of colon    small intestine; stomach  . Blood transfusion   . CHF (congestive heart failure) ([place])   . Complete heart block ([place])    s/p PPM 1998  . Complication of anesthesia    Difficult airway; anaphylaxis and swelling with propofol  . DDD (degenerative disc disease)   . Depression   . Diastolic heart failure   . Fatty liver 07/26/10  . GERD (gastroesophageal reflux disease)   . GI bleed   . Headache   . History of alcohol abuse Stopped Fall 2012  . History of tobacco use Quit Fall 2012  . Hx of cardiovascular stress test    a. Lexiscan Myoview (10/15):  Small inferolateral and apical defect c/w scar and poss soft tissue attenuation, no ischemia, EF 42%  . Hx of colonic polyp 08/13/10   adenomatous  . Hx of colonoscopy   . Hyperlipidemia   . Hyperlipidemia   . Hypertension   . Hypertrophic cardiomyopathy ([place])    dx by Dr [name] 2009  . Iron deficiency anemia   . Myocardial infarction ([place])   . Panic attack   . Panic attacks   . Permanent  atrial fibrillation ([place])   . Pneumonia   . Renal failure    baseline creatinine 1.6  . Right arm pain 01/08/2012  . RLS (restless legs syndrome)    Dx 06/2007  . Shortness of breath    sob on exertation  . Sleep apnea    Past Surgical History:  Procedure Laterality Date  . CARDIAC CATHETERIZATION    . CARDIAC CATHETERIZATION N/A 08/19/2016   Procedure: Right/Left Heart Cath and Coronary Angiography;  Surgeon: Jolaine Artist, MD;  Location: [facility];  Service: Cardiovascular;  Laterality: N/A;  . COLONOSCOPY WITH PROPOFOL N/A 06/16/2017   Procedure: COLONOSCOPY WITH PROPOFOL;  Surgeon: Mauri Pole, MD;  [facility];  Service: Endoscopy;  Laterality: N/A;  . DIRECT LARYNGOSCOPY N/A 09/18/2016   Procedure: DIRECT LARYNGOSCOPY, BRONCHOSCOPY, REMOVAL OF INTUBATION GRANULOMA;  Surgeon: [name], MD;  Location: [facility];  Service: ENT;  Laterality: N/A;  . DIRECT LARYNGOSCOPY N/A 10/20/2016   Procedure: EXTUBATION AND FLEXIBLE LARYNGOSCOPE;  Surgeon: [name], MD;  Location: [place];  Service: ENT;  Laterality: N/A;  . DIRECT LARYNGOSCOPY N/A 10/29/2016   Procedure: DIRECT LARYNGOSCOPY;  Surgeon: [name], MD;  Location: [facility];  Service: ENT;  Laterality: N/A;  . ESOPHAGOGASTRODUODENOSCOPY  12/23/2011   Procedure: ESOPHAGOGASTRODUODENOSCOPY (EGD);  Surgeon: [name], MD;  [facility];  Service: Endoscopy;  Laterality: N/A;  . ESOPHAGOGASTRODUODENOSCOPY (EGD) WITH PROPOFOL N/A 06/16/2017   Procedure: ESOPHAGOGASTRODUODENOSCOPY (EGD) WITH PROPOFOL;  Surgeon: Mauri Pole, MD;  [facility];  Service: Endoscopy;  Laterality: N/A;  . EXTUBATION (ENDOTRACHEAL) IN OR N/A 07/21/2016   Procedure: EXTUBATION (ENDOTRACHEAL) IN OR;  Surgeon: [name], MD;  Location: [place];  Service: ENT;  Laterality: N/A;  . GIVENS CAPSULE STUDY  12/23/2011   Procedure: GIVENS CAPSULE STUDY;  Surgeon: [name], MD;  [facility];  Service:  Endoscopy;  Laterality: N/A;  . MICROLARYNGOSCOPY WITH LASER N/A 10/16/2016   Procedure: MICRODIRECTLARYNGOSCOPY WITH LASER ABLATION AND KENLOG INJECTION;  Surgeon: [name], MD;  Location: [place];  Service: ENT;  Laterality: N/A;  . PACEMAKER INSERTION  1998   st jude, most recent gen change by [name] 4/12  . TRACHEOSTOMY TUBE PLACEMENT N/A 10/29/2016   Procedure: TRACHEOSTOMY;  Surgeon: [name], MD;  Location: [place];  Service: ENT;  Laterality: N/A;  . TUBAL LIGATION  04/01/2000    Current Outpatient Medications  Medication Sig Dispense Refill  . acetaminophen (TYLENOL) 325 MG tablet Take 325-650 mg by mouth every 6 (six) hours as needed.     [name] albuterol (PROVENTIL HFA;VENTOLIN HFA) 108 (90 Base) MCG/ACT inhaler Inhale 1-2 puffs into the lungs every 6 (six) hours as needed for wheezing or shortness of breath. 1 Inhaler 6  . albuterol (PROVENTIL) (2.5 MG/3ML) 0.083% nebulizer solution Take 3 mLs (2.5 mg total) by nebulization every 6 (six) hours as needed for wheezing or shortness of breath. 75 mL 2  . allopurinol (ZYLOPRIM) 100 MG tablet Take 200 mg by mouth daily.  2  . ALPRAZolam (XANAX) 0.5 MG tablet TAKE 1 TABLET BY MOUTH AT BEDTIME AS NEEDED FOR ANXIETY 15 tablet 2  . aspirin EC 81 MG tablet Take 81 mg by mouth daily.     . bisacodyl (DULCOLAX) 10 MG suppository Place 10 mg rectally daily as needed for mild constipation.     . cefdinir (OMNICEF) 250 MG/5ML suspension Take 6 mLs (300 mg total) by mouth 2 (two) times daily for 10 days. 120 mL 0  . Cholecalciferol (VITAMIN D3) 5000 units CAPS Take 5,000 Units by mouth daily.    . ferrous sulfate 325 (65 FE) MG tablet TAKE 1 TABLET BY MOUTH TWICE DAILY WITH MEALS TO KEEP BLOOD COUNT UP (Patient taking differently: Take 325 mg by mouth 2 (two) times daily with a meal. TO KEEP BLOOD COUNT UP) 60 tablet 11  . furosemide (LASIX) 40 MG tablet Take 1 tablet (40 mg total) by mouth 2 (two) times daily. 180 tablet 3  . metoprolol tartrate  (LOPRESSOR) 25 MG tablet TAKE 1 TABLET BY MOUTH EVERY 6 HOURS AS NEEDED FOR HEART FLUTTERING 360 tablet 3  . nitroGLYCERIN (NITROSTAT) 0.4 MG SL tablet Place 1 tablet (0.4 mg total) under the tongue every 5 (five) minutes as needed for chest pain. 25 tablet 1  . omeprazole (PRILOSEC) 20 MG capsule Take 1 capsule (20 mg total) by mouth daily. 30 capsule 6  . polyethylene glycol (MIRALAX / GLYCOLAX) packet Take 17 g by mouth daily as needed.    . potassium chloride (K-DUR) 10 MEQ tablet Take 10 mEq by mouth daily.     . predniSONE (DELTASONE) 5 MG tablet Take 10 mg by mouth daily.   3  . senna (SENOKOT) 8.6 MG TABS tablet Take 1 tablet by mouth daily as needed for mild  constipation.     No current facility-administered medications for this visit.    ROS- all systems are reviewed and negative except as per HPI above  Physical Exam: Vitals:   01/21/18 1032  BP: 110/62  Pulse: 72  Weight: 163 lb (73.9 kg)  Height: 5\' 4"  (1.626 m)    GEN- The patient is well appearing, alert and oriented x 3 today.   Head- normocephalic, atraumatic Eyes-  Sclera clear, conjunctiva pink Ears- hearing intact Oropharynx- clear Neck- +trach Lungs- Clear to ausculation bilaterally, normal work of breathing Chest- pacemaker pocket is well healed Heart- Regular rate and rhythm, (paced) GI- soft, NT, ND, + BS Extremities- no clubbing, cyanosis, or edema  Pacemaker interrogation- reviewed in detail today,  See PACEART report  Assessment and Plan:  1. Complete heart block Normal PPM function See paceart report No changes today   2. Permanent afib Not a candidate for long term anticoagulation due to prior GI bleeding or for watchman given recent difficulty with screening TEE.     3. HTN Stable No change required today  4. Atypical chest pain (chronic) Stable No change required today  5. Chronic systolic dysfunction  Euvolemic on exam Continue current therapy Follow up Dr Oval Linsey as scheduled       Follow-up with Dr Oval Linsey as scheduled, Dr Rayann Heman 6 months  Chanetta Marshall, NP  01/21/2018 11:06 AM

## 2018-01-20 NOTE — Telephone Encounter (Signed)
  Called Ms. Boateng-Polk to inform her of xray results. She did not answer, I left VM for her to call office back. Hip xray indicated possible AVN. She is at risk for this due to chronic steroid use. Discussed with Dr. Lorin Mercy at [facility] who recommended a walker to reduce weight bearing on that hip and he will see her in office to discuss next steps with her. Referral made to ortho.   Lucila Maine, DO PGY-2, [facility] 01/20/2018 11:50 AM

## 2018-01-20 NOTE — Telephone Encounter (Signed)
  Got in touch with Ms. AP and relayed results of hip xray. She has a walker at home to use, advised her to do so to reduce weight bearing on affected hip. She will follow up w/ Dr. Lorin Mercy once she is called to make appointment.   Lucila Maine, DO PGY-2, [facility] 01/20/2018 12:06 PM

## 2018-01-21 ENCOUNTER — Ambulatory Visit (INDEPENDENT_AMBULATORY_CARE_PROVIDER_SITE_OTHER): Payer: Medicare Other | Admitting: Nurse Practitioner

## 2018-01-21 ENCOUNTER — Encounter: Payer: Self-pay | Admitting: Nurse Practitioner

## 2018-01-21 VITALS — BP 110/62 | HR 72 | Ht 64.0 in | Wt 163.0 lb

## 2018-01-21 DIAGNOSIS — I5022 Chronic systolic (congestive) heart failure: Secondary | ICD-10-CM

## 2018-01-21 DIAGNOSIS — I4821 Permanent atrial fibrillation: Secondary | ICD-10-CM

## 2018-01-21 DIAGNOSIS — I482 Chronic atrial fibrillation: Secondary | ICD-10-CM

## 2018-01-21 DIAGNOSIS — I442 Atrioventricular block, complete: Secondary | ICD-10-CM

## 2018-01-21 DIAGNOSIS — I10 Essential (primary) hypertension: Secondary | ICD-10-CM

## 2018-01-21 NOTE — Patient Instructions (Addendum)
Medication Instructions:   Your physician recommends that you continue on your current medications as directed. Please refer to the Current Medication list given to you today.    If you need a refill on your cardiac medications before your next appointment, please call your pharmacy.  Labwork: NONE ORDERED  TODAY    Testing/Procedures: NONE ORDERED  TODAY    Follow-Up:  Your physician wants you to follow-up in:  IN  6 MONTHS WITH DR ALLRED   You will receive a reminder letter in the mail two months in advance. If you don't receive a letter, please call our office to schedule the follow-up appointment.      Any Other Special Instructions Will Be Listed Below (If Applicable).                                                                                                                                                   

## 2018-02-01 ENCOUNTER — Other Ambulatory Visit: Payer: Self-pay | Admitting: Nurse Practitioner

## 2018-02-01 ENCOUNTER — Other Ambulatory Visit: Payer: Self-pay | Admitting: Family Medicine

## 2018-02-01 NOTE — Telephone Encounter (Signed)
2nd request received from pharmacy. Hartford Maulden,CMA

## 2018-02-02 ENCOUNTER — Encounter (INDEPENDENT_AMBULATORY_CARE_PROVIDER_SITE_OTHER): Payer: Self-pay | Admitting: Orthopaedic Surgery

## 2018-02-02 ENCOUNTER — Ambulatory Visit (INDEPENDENT_AMBULATORY_CARE_PROVIDER_SITE_OTHER): Payer: Medicare Other | Admitting: Orthopaedic Surgery

## 2018-02-02 VITALS — BP 120/79 | HR 69 | Ht 64.0 in | Wt 165.0 lb

## 2018-02-02 DIAGNOSIS — M87052 Idiopathic aseptic necrosis of left femur: Secondary | ICD-10-CM | POA: Diagnosis not present

## 2018-02-02 NOTE — Progress Notes (Signed)
Office Visit Note   Patient: [name]           Date of Birth: 1946/05/30           MRN: [id number] Visit Date: 02/02/2018              Requested by: Steve Rattler, DO [place], [place] [id number] PCP: Steve Rattler, DO   Assessment & Plan: Visit Diagnoses:  1. Avascular necrosis of bone of left hip ([place])     Plan: I discussed with the patient that she should use her walker.  Her avascular necrosis may progress she may get collapse and may have to have a hip procedure.  She has multiple medical problems and would not be a great surgery risk due to her renal, cardiac, pulmonary and airway problems with tracheostomy.  She can revascularize the head without collapse she may be not require any surgical procedure.  Normally an MRI be obtained but she has a pacemaker.  Plan to recheck her in 2 months.  Follow-Up Instructions: Return in about 2 months (around 04/02/2018).   Orders:  No orders of the defined types were placed in this encounter.  No orders of the defined types were placed in this encounter.     Procedures: No procedures performed   Clinical Data: No additional findings.   Subjective: Chief Complaint  Patient presents with  . Left Hip - Pain    HPI 72 year old female referred from emergency room for left hip pain with plain radiograph showing evidence of avascular necrosis.  She has multiple medical problems including history of congestive heart failure acute on chronic, she has a pacemaker since 1986 and also had respiratory arrest during cardiac cath procedure requiring intubation and ultimate tracheostomy with pulmonary failure on a ventilator for an extended period of time.  She is unable to get an MRI due to her pacemaker.  X-rays demonstrated maintained hip space without acetabular changes.  Opposite hip does not hurt.  Retrospective review of a CT scan done of her abdomen in August 2018 also showed the AVN changes of the left  femoral head.  She is on a slow gradual prednisone wean over the next year.   Review of Systems positive for multisystem problems including generalized anxiety disorder, COPD, polymyalgia rheumatica, tracheostomy, acute on chronic congestive heart failure, stage III kidney disease, apnea, tracheostomy, pacemaker, laryngeal stenosis, tracheal stenosis, chronic low back pain otherwise negative as it pertains HPI.   Objective: Vital Signs: BP 120/79   Pulse 69   Ht 5\' 4"  (1.626 m)   Wt 165 lb (74.8 kg)   BMI 28.32 kg/m   Physical Exam  Constitutional: She is oriented to person, place, and time. She appears well-developed.  HENT:  Head: Normocephalic.  Right Ear: External ear normal.  Left Ear: External ear normal.  Eyes: Pupils are equal, round, and reactive to light.  Neck: No tracheal deviation present. No thyromegaly present.  Tracheostomy present.  Cardiovascular: Normal rate.  Pulmonary/Chest: Effort normal.  Abdominal: Soft.  Neurological: She is alert and oriented to person, place, and time.  Skin: Skin is warm and dry.  Psychiatric: She has a normal mood and affect. Her behavior is normal.    Ortho Exam patient has negative logroll to her hips palpable pulses no pitting edema no rash over exposed skin.  Anterior tib EHL is intact.  She has pain with weightbearing and has a slow deliberate gait without significant Trendelenburg hip limp.  Specialty Comments:  No specialty comments available.  Imaging: No results found.   PMFS History: Patient Active Problem List   Diagnosis Date Noted  . Calculus of gallbladder without cholecystitis without obstruction 12/24/2017  . Chronic cough 09/25/2017  . History of colonic polyps   . Polyp of cecum   . Polyp of ascending colon   . Polyp of transverse colon   . Polyp of descending colon   . Polyp of rectum   . Moderate protein-calorie malnutrition ([place]) 06/02/2017  . Chronic idiopathic constipation 03/19/2017  . Loss of  weight 01/26/2017  . 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 Permanent atrial fibrillation ([place])   . Laryngeal stenosis 09/18/2016  . Esophageal dysphagia   . Dyspnea on exertion   . Polymyalgia rheumatica ([place]) 04/10/2015  . Low back pain 03/28/2015  . Hypertrophic cardiomyopathy ([place]) 02/15/2015  . Eczema 09/18/2014  . Environmental allergies 09/18/2014  . Pre-diabetes 11/26/2013  . COPD, moderate ([place]) 11/01/2013  . Cough 07/15/2013  . Complete heart block ([place]) 04/03/2011  . Anemia 02/17/2011  . AVM (arteriovenous malformation) 02/13/2011  . PACEMAKER-[place] 11/28/2010  . CHRONIC KIDNEY DISEASE STAGE III (MODERATE) 09/24/2010  . Acute on chronic congestive heart failure ([place]) 08/30/2010  . ARTHRITIS 07/24/2010  . Generalized anxiety disorder 07/23/2010  . Hyperlipidemia 02/11/2007  . Essential hypertension 02/11/2007  . APNEA, SLEEP 02/11/2007   Past Medical History:  Diagnosis Date  . Anemia   . Angina   . Angioedema    2/2 ACE  . Arteriovenous malformation of stomach   . Arthritis   . Asthma   . AVM (arteriovenous malformation) of colon    small intestine; stomach  . Blood transfusion   . CHF (congestive heart failure) ([place])   . Complete heart block ([place])    s/p PPM 1998  . Complication of anesthesia    Difficult airway; anaphylaxis and swelling with propofol  . DDD (degenerative disc disease)   . Depression   . Diastolic heart failure   . Fatty liver 07/26/10  . GERD (gastroesophageal reflux disease)   . GI bleed   . Headache   . History of alcohol abuse Stopped Fall 2012  . History of tobacco use Quit Fall 2012  . Hx of cardiovascular stress test    a. Lexiscan Myoview (10/15):  Small inferolateral and apical defect c/w scar and poss soft tissue attenuation, no ischemia, EF 42%  . Hx of colonic polyp 08/13/10   adenomatous  . Hx of colonoscopy   . Hyperlipidemia   . Hyperlipidemia   . Hypertension   . Hypertrophic cardiomyopathy ([place])    dx by Dr [name]  2009  . Iron deficiency anemia   . Myocardial infarction ([place])   . Panic attack   . Panic attacks   . Permanent atrial fibrillation ([place])   . Pneumonia   . Renal failure    baseline creatinine 1.6  . Right arm pain 01/08/2012  . RLS (restless legs syndrome)    Dx 06/2007  . Shortness of breath    sob on exertation  . Sleep apnea     Family History  Problem Relation Age of Onset  . Hypertension Mother   . Stroke Mother   . Heart disease Mother   . Aneurysm Mother   . CVA Mother   . Hypertension Father   . Stroke Father   . Heart attack Father   . Alcohol abuse Father   . Hypertension Sister   . Hypertension Brother   .  Colon cancer Brother   . Cancer Brother   . Diabetes Sister   . Diabetes Brother   . Heart attack Brother   . Heart attack Brother   . Heart disease Sister   . Alcohol abuse Brother   . Heart disease Daughter     Past Surgical History:  Procedure Laterality Date  . CARDIAC CATHETERIZATION    . CARDIAC CATHETERIZATION N/A 08/19/2016   Procedure: Right/Left Heart Cath and Coronary Angiography;  Surgeon: Jolaine Artist, MD;  Location: [facility];  Service: Cardiovascular;  Laterality: N/A;  . COLONOSCOPY WITH PROPOFOL N/A 06/16/2017   Procedure: COLONOSCOPY WITH PROPOFOL;  Surgeon: Mauri Pole, MD;  [facility];  Service: Endoscopy;  Laterality: N/A;  . DIRECT LARYNGOSCOPY N/A 09/18/2016   Procedure: DIRECT LARYNGOSCOPY, BRONCHOSCOPY, REMOVAL OF INTUBATION GRANULOMA;  Surgeon: [name], MD;  Location: [facility] OR;  Service: ENT;  Laterality: N/A;  . DIRECT LARYNGOSCOPY N/A 10/20/2016   Procedure: EXTUBATION AND FLEXIBLE LARYNGOSCOPE;  Surgeon: [name], MD;  Location: [place];  Service: ENT;  Laterality: N/A;  . DIRECT LARYNGOSCOPY N/A 10/29/2016   Procedure: DIRECT LARYNGOSCOPY;  Surgeon: [name], MD;  Location: [facility] OR;  Service: ENT;  Laterality: N/A;  . ESOPHAGOGASTRODUODENOSCOPY  12/23/2011   Procedure:  ESOPHAGOGASTRODUODENOSCOPY (EGD);  Surgeon: [name], MD;  Location: [facility];  Service: Endoscopy;  Laterality: N/A;  . ESOPHAGOGASTRODUODENOSCOPY (EGD) WITH PROPOFOL N/A 06/16/2017   Procedure: ESOPHAGOGASTRODUODENOSCOPY (EGD) WITH PROPOFOL;  Surgeon: Mauri Pole, MD;  [facility];  Service: Endoscopy;  Laterality: N/A;  . EXTUBATION (ENDOTRACHEAL) IN OR N/A 07/21/2016   Procedure: EXTUBATION (ENDOTRACHEAL) IN OR;  Surgeon: [name], MD;  Location: [place];  Service: ENT;  Laterality: N/A;  . GIVENS CAPSULE STUDY  12/23/2011   Procedure: GIVENS CAPSULE STUDY;  Surgeon: [name], MD;  [facility];  Service: Endoscopy;  Laterality: N/A;  . MICROLARYNGOSCOPY WITH LASER N/A 10/16/2016   Procedure: MICRODIRECTLARYNGOSCOPY WITH LASER ABLATION AND KENLOG INJECTION;  Surgeon: [name], MD;  Location: [place];  Service: ENT;  Laterality: N/A;  . PACEMAKER INSERTION  1998   st jude, most recent gen change by [name] 4/12  . TRACHEOSTOMY TUBE PLACEMENT N/A 10/29/2016   Procedure: TRACHEOSTOMY;  Surgeon: [name], MD;  Location: [place];  Service: ENT;  Laterality: N/A;  . TUBAL LIGATION  04/01/2000   Social History   Occupational History  . Occupation: disabled    Fish farm manager: UNEMPLOYED  . Occupation: previously- Development worker, community  Tobacco Use  . Smoking status: Former Smoker    Packs/day: 0.10    Years: 50.00    Pack years: 5.00    Types: Cigarettes    Last attempt to quit: 08/16/2011    Years since quitting: 6.4  . Smokeless tobacco: Never Used  . Tobacco comment: quit 2012  Substance and Sexual Activity  . Alcohol use: No    Alcohol/week: 0.0 oz    Comment: quit 08/16/2011  . Drug use: No  . Sexual activity: No

## 2018-02-05 ENCOUNTER — Telehealth: Payer: Self-pay | Admitting: Family Medicine

## 2018-02-05 ENCOUNTER — Other Ambulatory Visit: Payer: Self-pay | Admitting: Family Medicine

## 2018-02-05 MED ORDER — POTASSIUM CHLORIDE ER 10 MEQ PO TBCR: 10.0000 meq | EXTENDED_RELEASE_TABLET | Freq: Every day | ORAL | 3 refills | Status: DC

## 2018-02-05 NOTE — Telephone Encounter (Signed)
Pt wants to know about her potassium chloride.  She needed it refilled and according to the pharmacy, it was denied by the dr. Please advise

## 2018-02-05 NOTE — Telephone Encounter (Signed)
Looked up the refill, just said "refill not appropriate." Please advise.

## 2018-02-05 NOTE — Telephone Encounter (Signed)
Refilled potassium  Lucila Maine, DO PGY-2, [facility] 02/05/2018 11:31 AM

## 2018-02-16 DIAGNOSIS — H25813 Combined forms of age-related cataract, bilateral: Secondary | ICD-10-CM | POA: Diagnosis not present

## 2018-02-16 DIAGNOSIS — H10413 Chronic giant papillary conjunctivitis, bilateral: Secondary | ICD-10-CM | POA: Diagnosis not present

## 2018-02-16 DIAGNOSIS — H35033 Hypertensive retinopathy, bilateral: Secondary | ICD-10-CM | POA: Diagnosis not present

## 2018-02-16 DIAGNOSIS — H40023 Open angle with borderline findings, high risk, bilateral: Secondary | ICD-10-CM | POA: Diagnosis not present

## 2018-02-25 ENCOUNTER — Other Ambulatory Visit: Payer: Self-pay | Admitting: Family Medicine

## 2018-02-25 DIAGNOSIS — J40 Bronchitis, not specified as acute or chronic: Secondary | ICD-10-CM | POA: Diagnosis not present

## 2018-02-25 DIAGNOSIS — G4733 Obstructive sleep apnea (adult) (pediatric): Secondary | ICD-10-CM | POA: Diagnosis not present

## 2018-02-25 DIAGNOSIS — J386 Stenosis of larynx: Secondary | ICD-10-CM | POA: Diagnosis not present

## 2018-02-25 DIAGNOSIS — Z430 Encounter for attention to tracheostomy: Secondary | ICD-10-CM | POA: Diagnosis not present

## 2018-03-02 ENCOUNTER — Ambulatory Visit (INDEPENDENT_AMBULATORY_CARE_PROVIDER_SITE_OTHER): Payer: Medicare Other | Admitting: Family Medicine

## 2018-03-02 ENCOUNTER — Encounter: Payer: Self-pay | Admitting: Family Medicine

## 2018-03-02 ENCOUNTER — Other Ambulatory Visit: Payer: Self-pay

## 2018-03-02 VITALS — BP 138/70 | HR 70 | Temp 97.6°F | Wt 167.0 lb

## 2018-03-02 DIAGNOSIS — J209 Acute bronchitis, unspecified: Secondary | ICD-10-CM

## 2018-03-02 NOTE — Progress Notes (Signed)
    Subjective:  [name] is a 72 y.o. female who presents to the [facility] today with a chief complaint of sough.   HPI:  Cough for the past 1 week, started with cold like symptoms. More phlegm out of trach with more yellow and dark looking Feeling more SOB with wheezing.  No fevers/chills/body aches No nausea/vomiting. Saw ENT on 02/25/18 and was found to have lingering bronchitis or sinusitis while on omnicef and so was switched to ciprofloxacin which the patient was only able to pick up yesterday.  ROS: Per HPI  Objective:  Physical Exam: BP 138/70   Pulse 70   Temp 97.6 F (36.4 C) (Oral)   Wt 167 lb (75.8 kg)   SpO2 99%   BMI 28.67 kg/m   Gen: NAD, resting comfortably HEENT: trach in place, area is c/d/i CV: RRR with no murmurs appreciated Pulm: NWOB, diffuse coarse breath sounds with good air movement, no wheezes GI: Normal bowel sounds present. Soft, Nontender, Nondistended. Skin: warm, dry Neuro: grossly normal, moves all extremities Psych: Normal affect and thought content  Assessment/Plan:  Acute bronchitis Patient is being treated by ENT as a bronchitis vs sinusitis. She has failed omnicef but only start ciprofloxacin yesterday. She is well appearing but does have coarse breath sounds with normal work of breathing. Continue ciprofloxacin per ENT and discussed return precautions.    Bufford Lope, DO PGY-2, [facility] 03/02/2018 11:05 AM

## 2018-03-02 NOTE — Patient Instructions (Addendum)
It was good to see you today!  Please continue taking your antibiotic ciprofloxacin, finish the entire course even if you start to feel better sooner. If you still feel bad after your finish your antibiotics then please come back and see [place] for re-evaluation.  Please make an appointment with your doctor whenever you like to talk about your other concerns.  Please bring all of your medications with you to each visit.   Sign up for My Chart to have easy access to your labs results, and communication with your primary care physician.  Feel free to call with any questions or concerns at any time, at [phone].   Take care,  Dr. Bufford Lope, [place]

## 2018-03-03 DIAGNOSIS — J209 Acute bronchitis, unspecified: Secondary | ICD-10-CM | POA: Insufficient documentation

## 2018-03-03 NOTE — Assessment & Plan Note (Addendum)
Patient is being treated by ENT as a bronchitis vs sinusitis. She has failed omnicef but only start ciprofloxacin yesterday. She is well appearing but does have coarse breath sounds with normal work of breathing. Continue ciprofloxacin per ENT and discussed return precautions.

## 2018-03-10 DIAGNOSIS — Z930 Tracheostomy status: Secondary | ICD-10-CM | POA: Diagnosis not present

## 2018-03-10 DIAGNOSIS — J449 Chronic obstructive pulmonary disease, unspecified: Secondary | ICD-10-CM | POA: Diagnosis not present

## 2018-03-11 DIAGNOSIS — Z930 Tracheostomy status: Secondary | ICD-10-CM | POA: Diagnosis not present

## 2018-03-11 DIAGNOSIS — D649 Anemia, unspecified: Secondary | ICD-10-CM | POA: Diagnosis not present

## 2018-03-11 DIAGNOSIS — J398 Other specified diseases of upper respiratory tract: Secondary | ICD-10-CM | POA: Diagnosis not present

## 2018-03-11 DIAGNOSIS — J449 Chronic obstructive pulmonary disease, unspecified: Secondary | ICD-10-CM | POA: Diagnosis not present

## 2018-03-11 DIAGNOSIS — I4891 Unspecified atrial fibrillation: Secondary | ICD-10-CM | POA: Diagnosis not present

## 2018-03-17 ENCOUNTER — Other Ambulatory Visit: Payer: Self-pay

## 2018-03-17 ENCOUNTER — Encounter: Payer: Self-pay | Admitting: Family Medicine

## 2018-03-17 ENCOUNTER — Ambulatory Visit (INDEPENDENT_AMBULATORY_CARE_PROVIDER_SITE_OTHER): Payer: Medicare Other | Admitting: Family Medicine

## 2018-03-17 VITALS — BP 118/68 | HR 69 | Temp 97.8°F | Ht 64.0 in | Wt 169.0 lb

## 2018-03-17 DIAGNOSIS — M353 Polymyalgia rheumatica: Secondary | ICD-10-CM

## 2018-03-17 DIAGNOSIS — L308 Other specified dermatitis: Secondary | ICD-10-CM | POA: Diagnosis not present

## 2018-03-17 DIAGNOSIS — R05 Cough: Secondary | ICD-10-CM

## 2018-03-17 DIAGNOSIS — R053 Chronic cough: Secondary | ICD-10-CM

## 2018-03-17 MED ORDER — CETIRIZINE HCL 10 MG PO TABS: 10.0000 mg | ORAL_TABLET | Freq: Every day | ORAL | 2 refills | Status: DC

## 2018-03-17 MED ORDER — TRIAMCINOLONE ACETONIDE 0.1 % EX OINT: TOPICAL_OINTMENT | Freq: Two times a day (BID) | CUTANEOUS | Status: DC

## 2018-03-17 MED ORDER — TRIAMCINOLONE ACETONIDE 0.1 % EX CREA: 1.0000 "application " | TOPICAL_CREAM | Freq: Two times a day (BID) | CUTANEOUS | 0 refills | Status: DC

## 2018-03-17 MED ORDER — GUAIFENESIN ER 1200 MG PO TB12: 1200.0000 mg | ORAL_TABLET | Freq: Two times a day (BID) | ORAL | 0 refills | Status: DC

## 2018-03-17 NOTE — Progress Notes (Signed)
    Subjective:    Patient ID: [name], female    DOB: 21-May-1946, 72 y.o.   MRN: [id number]   CC: pneumonia fup  Pna- Took cipro for 14 days did not report a big improvement.  She endorses mucous, dark phlegm. Denies fevers or chills.  Uses albuterol as needed but does not seem to benefit much from it in terms of breathing.  PMR- endorses increasing severity of body aches. On 10 mg prednisone. Has not followed up with rheum since January.   She is otherwise doing well. She has a small itchy spot on her right lower leg that is bothering her.   Smoking status reviewed- former smoker  Review of Systems- endorses chills and cough. No fevers.    Objective:  BP 118/68   Pulse 69   Temp 97.8 F (36.6 C) (Oral)   Ht 5\' 4"  (1.626 m)   Wt 169 lb (76.7 kg)   SpO2 99%   BMI 29.01 kg/m  Vitals and nursing note reviewed  General: well nourished, in no acute distress Cardiac: RRR, clear S1 and S2, no murmurs, rubs, or gallops Respiratory: clear to auscultation bilaterally, no increased work of breathing Extremities: no edema or cyanosis. Skin: warm and dry, no rashes noted. Small circular area of dry skin with evidence of excoriation on right lower anterior leg Neuro: alert and oriented, no focal deficits   Assessment & Plan:   1. Other eczema Area on right lower leg consistent with eczema. Does not appear to be fungal in nature. Rx for triamcinolone sent to pharmacy to use BID for area of concern. Asked patient to follow up if area fails to resolve.    2. Chronic cough This is chronic for her and likely related to her trach and upper respiratory stenosis, mucous. We discuss this at every appointment. She did not have improvement with antibiotics, has not had improvement with albuterol inhaler. I have reassured her that I do not think she has pneumonia or lung etiology for her cough. I have sent in rx for mucinex to help thin mucous and an antihistamine to help decrease  mucous now that pollen season is upon [place]. Follow up with ENT as scheduled.  3. Polymyalgia rheumatica (HCC) Continue 10 mg prednisone. Asked patient to make follow up appointment with rheumatology to discuss steroid taper and symptoms.    Return as needed.   Lucila Maine, DO Family Medicine Resident PGY-2

## 2018-03-17 NOTE — Patient Instructions (Addendum)
  Please call the breast center to schedule your mammogram.   Please call your rheumatologist to make a follow up appointment.  [place] [phone]  If you have questions or concerns please do not hesitate to call at [phone].  Lucila Maine, DO PGY-2, [facility] 03/17/2018 2:19 PM

## 2018-03-20 ENCOUNTER — Emergency Department (HOSPITAL_COMMUNITY): Payer: Medicare Other

## 2018-03-20 ENCOUNTER — Observation Stay (HOSPITAL_COMMUNITY)
Admission: EM | Admit: 2018-03-20 | Discharge: 2018-03-22 | Disposition: A | Discharge status: Home / Self Care | Payer: Medicare Other | Attending: Family Medicine | Admitting: Family Medicine

## 2018-03-20 ENCOUNTER — Emergency Department (HOSPITAL_BASED_OUTPATIENT_CLINIC_OR_DEPARTMENT_OTHER): Admit: 2018-03-20 | Discharge: 2018-03-20 | Disposition: A | Discharge status: Home / Self Care | Payer: Medicare Other

## 2018-03-20 ENCOUNTER — Encounter (HOSPITAL_COMMUNITY): Payer: Self-pay

## 2018-03-20 ENCOUNTER — Inpatient Hospital Stay (HOSPITAL_COMMUNITY): Admit: 2018-03-20 | Payer: Medicare Other

## 2018-03-20 DIAGNOSIS — N183 Chronic kidney disease, stage 3 unspecified: Secondary | ICD-10-CM

## 2018-03-20 DIAGNOSIS — F329 Major depressive disorder, single episode, unspecified: Secondary | ICD-10-CM | POA: Diagnosis not present

## 2018-03-20 DIAGNOSIS — G4730 Sleep apnea, unspecified: Secondary | ICD-10-CM | POA: Insufficient documentation

## 2018-03-20 DIAGNOSIS — I081 Rheumatic disorders of both mitral and tricuspid valves: Secondary | ICD-10-CM | POA: Insufficient documentation

## 2018-03-20 DIAGNOSIS — M109 Gout, unspecified: Secondary | ICD-10-CM | POA: Diagnosis not present

## 2018-03-20 DIAGNOSIS — D631 Anemia in chronic kidney disease: Secondary | ICD-10-CM | POA: Insufficient documentation

## 2018-03-20 DIAGNOSIS — Z888 Allergy status to other drugs, medicaments and biological substances status: Secondary | ICD-10-CM | POA: Diagnosis not present

## 2018-03-20 DIAGNOSIS — I5032 Chronic diastolic (congestive) heart failure: Secondary | ICD-10-CM | POA: Diagnosis not present

## 2018-03-20 DIAGNOSIS — J441 Chronic obstructive pulmonary disease with (acute) exacerbation: Secondary | ICD-10-CM | POA: Diagnosis not present

## 2018-03-20 DIAGNOSIS — Z950 Presence of cardiac pacemaker: Secondary | ICD-10-CM | POA: Diagnosis not present

## 2018-03-20 DIAGNOSIS — I482 Chronic atrial fibrillation: Secondary | ICD-10-CM | POA: Diagnosis not present

## 2018-03-20 DIAGNOSIS — I422 Other hypertrophic cardiomyopathy: Secondary | ICD-10-CM | POA: Diagnosis not present

## 2018-03-20 DIAGNOSIS — R7303 Prediabetes: Secondary | ICD-10-CM | POA: Diagnosis not present

## 2018-03-20 DIAGNOSIS — I442 Atrioventricular block, complete: Secondary | ICD-10-CM | POA: Diagnosis not present

## 2018-03-20 DIAGNOSIS — Z87891 Personal history of nicotine dependence: Secondary | ICD-10-CM | POA: Insufficient documentation

## 2018-03-20 DIAGNOSIS — M353 Polymyalgia rheumatica: Secondary | ICD-10-CM | POA: Insufficient documentation

## 2018-03-20 DIAGNOSIS — M1990 Unspecified osteoarthritis, unspecified site: Secondary | ICD-10-CM | POA: Insufficient documentation

## 2018-03-20 DIAGNOSIS — I252 Old myocardial infarction: Secondary | ICD-10-CM | POA: Diagnosis not present

## 2018-03-20 DIAGNOSIS — Z79899 Other long term (current) drug therapy: Secondary | ICD-10-CM | POA: Insufficient documentation

## 2018-03-20 DIAGNOSIS — R0789 Other chest pain: Secondary | ICD-10-CM | POA: Diagnosis not present

## 2018-03-20 DIAGNOSIS — R0602 Shortness of breath: Secondary | ICD-10-CM | POA: Diagnosis not present

## 2018-03-20 DIAGNOSIS — K219 Gastro-esophageal reflux disease without esophagitis: Secondary | ICD-10-CM | POA: Diagnosis not present

## 2018-03-20 DIAGNOSIS — R069 Unspecified abnormalities of breathing: Secondary | ICD-10-CM | POA: Diagnosis not present

## 2018-03-20 DIAGNOSIS — Z86010 Personal history of colonic polyps: Secondary | ICD-10-CM | POA: Insufficient documentation

## 2018-03-20 DIAGNOSIS — Z930 Tracheostomy status: Secondary | ICD-10-CM | POA: Diagnosis not present

## 2018-03-20 DIAGNOSIS — F411 Generalized anxiety disorder: Secondary | ICD-10-CM | POA: Diagnosis not present

## 2018-03-20 DIAGNOSIS — I130 Hypertensive heart and chronic kidney disease with heart failure and stage 1 through stage 4 chronic kidney disease, or unspecified chronic kidney disease: Secondary | ICD-10-CM | POA: Insufficient documentation

## 2018-03-20 DIAGNOSIS — E785 Hyperlipidemia, unspecified: Secondary | ICD-10-CM | POA: Insufficient documentation

## 2018-03-20 DIAGNOSIS — Z430 Encounter for attention to tracheostomy: Secondary | ICD-10-CM

## 2018-03-20 DIAGNOSIS — M79662 Pain in left lower leg: Secondary | ICD-10-CM | POA: Diagnosis not present

## 2018-03-20 DIAGNOSIS — M7989 Other specified soft tissue disorders: Secondary | ICD-10-CM | POA: Diagnosis not present

## 2018-03-20 DIAGNOSIS — N184 Chronic kidney disease, stage 4 (severe): Secondary | ICD-10-CM

## 2018-03-20 DIAGNOSIS — F410 Panic disorder [episodic paroxysmal anxiety] without agoraphobia: Secondary | ICD-10-CM | POA: Diagnosis not present

## 2018-03-20 LAB — I-STAT TROPONIN, ED
TROPONIN I, POC: 0.18 ng/mL — AB (ref 0.00–0.08)
TROPONIN I, POC: 0.13 ng/mL — AB (ref 0.00–0.08)

## 2018-03-20 LAB — BASIC METABOLIC PANEL
Anion gap: 15 (ref 5–15)
BUN: 52 mg/dL — ABNORMAL HIGH (ref 6–20)
CHLORIDE: 98 mmol/L — AB (ref 101–111)
CO2: 24 mmol/L (ref 22–32)
Calcium: 9.4 mg/dL (ref 8.9–10.3)
Creatinine, Ser: 1.7 mg/dL — ABNORMAL HIGH (ref 0.44–1.00)
GFR calc non Af Amer: 28 mL/min — ABNORMAL LOW (ref >60)
GFR, EST AFRICAN AMERICAN: 33 mL/min — AB (ref >60)
Glucose, Bld: 196 mg/dL — ABNORMAL HIGH (ref 65–99)
Potassium: 3.9 mmol/L (ref 3.5–5.1)
SODIUM: 137 mmol/L (ref 135–145)

## 2018-03-20 LAB — CBC WITH DIFFERENTIAL/PLATELET
Basophils Absolute: 0 10*3/uL (ref 0.0–0.1)
Basophils Relative: 0 %
Eosinophils Absolute: 0 10*3/uL (ref 0.0–0.7)
Eosinophils Relative: 0 %
HCT: 30.1 % — ABNORMAL LOW (ref 36.0–46.0)
HEMOGLOBIN: 8.9 g/dL — AB (ref 12.0–15.0)
LYMPHS ABS: 1.3 10*3/uL (ref 0.7–4.0)
LYMPHS PCT: 13 %
MCH: 26.3 pg (ref 26.0–34.0)
MCHC: 29.6 g/dL — ABNORMAL LOW (ref 30.0–36.0)
MCV: 88.8 fL (ref 78.0–100.0)
Monocytes Absolute: 0.7 10*3/uL (ref 0.1–1.0)
Monocytes Relative: 6 %
NEUTROS ABS: 8.6 10*3/uL — AB (ref 1.7–7.7)
Neutrophils Relative %: 81 %
Platelets: 313 10*3/uL (ref 150–400)
RBC: 3.39 MIL/uL — AB (ref 3.87–5.11)
RDW: 15.2 % (ref 11.5–15.5)
WBC: 10.6 10*3/uL — AB (ref 4.0–10.5)

## 2018-03-20 LAB — D-DIMER, QUANTITATIVE (NOT AT ARMC): D DIMER QUANT: 2.61 ug{FEU}/mL — AB (ref 0.00–0.50)

## 2018-03-20 LAB — BRAIN NATRIURETIC PEPTIDE: B NATRIURETIC PEPTIDE 5: 289.1 pg/mL — AB (ref 0.0–100.0)

## 2018-03-20 MED ORDER — FUROSEMIDE 10 MG/ML IJ SOLN
40.0000 mg | Freq: Once | INTRAMUSCULAR | Status: AC
  Administered 2018-03-20: 40 mg via INTRAVENOUS
  Filled 2018-03-20: qty 4

## 2018-03-20 NOTE — ED Notes (Signed)
Advised pt she is unable to eat yet per Dr. Ralene Bathe

## 2018-03-20 NOTE — ED Notes (Signed)
[phone] Home number for daughter

## 2018-03-20 NOTE — ED Triage Notes (Signed)
Pt arrived via [facility] from home c/o SOB since yesterday on duoneb treatment.  EMS gave 125mg  solumedrol.  Continuous paced rhythm.

## 2018-03-20 NOTE — ED Notes (Signed)
Vascular at bedside

## 2018-03-20 NOTE — Progress Notes (Signed)
VASCULAR LAB PRELIMINARY  PRELIMINARY  PRELIMINARY  PRELIMINARY  Left lower extremity venous duplex completed.    Preliminary report:  There is no DVT or SVT noted in the left lower extremity.  Gave report to Dr. Roni Bread, [facility] RVT 03/20/2018, 5:12 PM

## 2018-03-20 NOTE — ED Notes (Signed)
Report attempted 

## 2018-03-20 NOTE — ED Provider Notes (Signed)
[facility] EMERGENCY DEPARTMENT Provider Note   CSN: [id number] Arrival date & time: 03/20/18  1503     History   Chief Complaint Chief Complaint  Patient presents with  . Shortness of Breath    HPI [name] is a 72 y.o. female.  The history is provided by the patient and the EMS personnel. No language interpreter was used.  Shortness of Breath    [name] is a 72 y.o. female who presents to the Emergency Department complaining of sob. She reports several days of progressive's shortness of breath with associated chest discomfort. She feels like she needs to cough and clear secretions but is unable to clear them. No reports of fever. She does report some lower extremity swelling as well as left calf pain. She has been seen by her doctor for the symptoms with no clear diagnosis. She received Solu-Medrol and albuterol treatments by EMS prior to ED arrival and did have some partial improvement in her symptoms with these treatments. Past Medical History:  Diagnosis Date  . Anemia   . Angina   . Angioedema    2/2 ACE  . Arteriovenous malformation of stomach   . Arthritis   . Asthma   . AVM (arteriovenous malformation) of colon    small intestine; stomach  . Blood transfusion   . CHF (congestive heart failure) ([place])   . Complete heart block ([place])    s/p PPM 1998  . Complication of anesthesia    Difficult airway; anaphylaxis and swelling with propofol  . DDD (degenerative disc disease)   . Depression   . Diastolic heart failure   . Fatty liver 07/26/10  . GERD (gastroesophageal reflux disease)   . GI bleed   . Headache   . History of alcohol abuse Stopped Fall 2012  . History of tobacco use Quit Fall 2012  . Hx of cardiovascular stress test    a. Lexiscan Myoview (10/15):  Small inferolateral and apical defect c/w scar and poss soft tissue attenuation, no ischemia, EF 42%  . Hx of colonic polyp 08/13/10   adenomatous  . Hx of  colonoscopy   . Hyperlipidemia   . Hyperlipidemia   . Hypertension   . Hypertrophic cardiomyopathy ([place])    dx by Dr [name] 2009  . Iron deficiency anemia   . Myocardial infarction ([place])   . Panic attack   . Panic attacks   . Permanent atrial fibrillation ([place])   . Pneumonia   . Renal failure    baseline creatinine 1.6  . Right arm pain 01/08/2012  . RLS (restless legs syndrome)    Dx 06/2007  . Shortness of breath    sob on exertation  . Sleep apnea     Patient Active Problem List   Diagnosis Date Noted  . Shortness of breath 03/20/2018  . Acute bronchitis 03/03/2018  . Calculus of gallbladder without cholecystitis without obstruction 12/24/2017  . Chronic cough 09/25/2017  . History of colonic polyps   . Polyp of cecum   . Polyp of ascending colon   . Polyp of transverse colon   . Polyp of descending colon   . Polyp of rectum   . Moderate protein-calorie malnutrition ([place]) 06/02/2017  . Chronic idiopathic constipation 03/19/2017  . Loss of weight 01/26/2017  . 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 Permanent atrial fibrillation ([place])   . Laryngeal stenosis 09/18/2016  . Esophageal dysphagia   . Dyspnea on exertion   . Polymyalgia rheumatica ([place]) 04/10/2015  .  Low back pain 03/28/2015  . Hypertrophic cardiomyopathy ([place]) 02/15/2015  . Eczema 09/18/2014  . Environmental allergies 09/18/2014  . Pre-diabetes 11/26/2013  . COPD, moderate ([place]) 11/01/2013  . Cough 07/15/2013  . Complete heart block ([place]) 04/03/2011  . Anemia 02/17/2011  . AVM (arteriovenous malformation) 02/13/2011  . PACEMAKER-[facility] 11/28/2010  . CHRONIC KIDNEY DISEASE STAGE III (MODERATE) 09/24/2010  . Acute on chronic congestive heart failure ([place]) 08/30/2010  . ARTHRITIS 07/24/2010  . Generalized anxiety disorder 07/23/2010  . Hyperlipidemia 02/11/2007  . Essential hypertension 02/11/2007  . APNEA, SLEEP 02/11/2007    Past Surgical History:  Procedure Laterality Date  . CARDIAC  CATHETERIZATION    . CARDIAC CATHETERIZATION N/A 08/19/2016   Procedure: Right/Left Heart Cath and Coronary Angiography;  Surgeon: Jolaine Artist, MD;  Location: [facility] CV LAB;  Service: Cardiovascular;  Laterality: N/A;  . COLONOSCOPY WITH PROPOFOL N/A 06/16/2017   Procedure: COLONOSCOPY WITH PROPOFOL;  Surgeon: Mauri Pole, MD;  Location: [facility];  Service: Endoscopy;  Laterality: N/A;  . DIRECT LARYNGOSCOPY N/A 09/18/2016   Procedure: DIRECT LARYNGOSCOPY, BRONCHOSCOPY, REMOVAL OF INTUBATION GRANULOMA;  Surgeon: [name], MD;  Location: [facility];  Service: ENT;  Laterality: N/A;  . DIRECT LARYNGOSCOPY N/A 10/20/2016   Procedure: EXTUBATION AND FLEXIBLE LARYNGOSCOPE;  Surgeon: [name], MD;  Location: [facility];  Service: ENT;  Laterality: N/A;  . DIRECT LARYNGOSCOPY N/A 10/29/2016   Procedure: DIRECT LARYNGOSCOPY;  Surgeon: [name], MD;  Location: [facility] OR;  Service: ENT;  Laterality: N/A;  . ESOPHAGOGASTRODUODENOSCOPY  12/23/2011   Procedure: ESOPHAGOGASTRODUODENOSCOPY (EGD);  Surgeon: [name], MD;  Location: [facility];  Service: Endoscopy;  Laterality: N/A;  . ESOPHAGOGASTRODUODENOSCOPY (EGD) WITH PROPOFOL N/A 06/16/2017   Procedure: ESOPHAGOGASTRODUODENOSCOPY (EGD) WITH PROPOFOL;  Surgeon: Mauri Pole, MD;  Location: [facility];  Service: Endoscopy;  Laterality: N/A;  . EXTUBATION (ENDOTRACHEAL) IN OR N/A 07/21/2016   Procedure: EXTUBATION (ENDOTRACHEAL) IN OR;  Surgeon: [name], MD;  Location: [facility];  Service: ENT;  Laterality: N/A;  . GIVENS CAPSULE STUDY  12/23/2011   Procedure: GIVENS CAPSULE STUDY;  Surgeon: [name], MD;  Location: [facility];  Service: Endoscopy;  Laterality: N/A;  . MICROLARYNGOSCOPY WITH LASER N/A 10/16/2016   Procedure: MICRODIRECTLARYNGOSCOPY WITH LASER ABLATION AND KENLOG INJECTION;  Surgeon: [name], MD;  Location: [facility];  Service: ENT;  Laterality: N/A;  . PACEMAKER INSERTION  1998   [facility], most recent gen  change by [name] 4/12  . TRACHEOSTOMY TUBE PLACEMENT N/A 10/29/2016   Procedure: TRACHEOSTOMY;  Surgeon: [name], MD;  Location: [facility];  Service: ENT;  Laterality: N/A;  . TUBAL LIGATION  04/01/2000     OB History   None      Home Medications    Prior to Admission medications   Medication Sig Start Date End Date Taking? Authorizing Provider  acetaminophen (TYLENOL) 325 MG tablet Take 325-650 mg by mouth every 6 (six) hours as needed for mild pain, fever or headache.    Yes [provider]  albuterol (PROVENTIL HFA;VENTOLIN HFA) 108 (90 Base) MCG/ACT inhaler Inhale 1-2 puffs into the lungs every 6 (six) hours as needed for wheezing or shortness of breath. 08/11/17  Yes Riccio, Angela C, DO  albuterol (PROVENTIL) (2.5 MG/3ML) 0.083% nebulizer solution Take 3 mLs (2.5 mg total) by nebulization every 6 (six) hours as needed for wheezing or shortness of breath. 06/26/17  Yes Lucila Maine C, DO  allopurinol (ZYLOPRIM) 100 MG tablet Take  200 mg by mouth daily. 01/01/18  Yes [provider]  ALPRAZolam Duanne Moron) 0.5 MG tablet TAKE 1 TABLET BY MOUTH AT BEDTIME AS NEEDED FOR ANXIETY 02/25/18  Yes Steve Rattler, DO  aspirin EC 81 MG tablet Take 81 mg by mouth daily.    Yes [provider]  cetirizine (ZYRTEC) 10 MG tablet Take 1 tablet (10 mg total) by mouth daily. 03/17/18  Yes Steve Rattler, DO  Cholecalciferol (VITAMIN D3) 5000 units CAPS Take 5,000 Units by mouth daily.   Yes [provider]  ferrous sulfate 325 (65 FE) MG tablet TAKE 1 TABLET BY MOUTH TWICE DAILY WITH MEALS TO KEEP BLOOD COUNT UP Patient taking differently: Take 325 mg by mouth 2 (two) times daily with a meal. TO KEEP BLOOD COUNT UP 08/26/16  Yes Riccio, Angela C, DO  furosemide (LASIX) 40 MG tablet Take 1 tablet (40 mg total) by mouth 2 (two) times daily. 08/26/17  Yes Riccio, Angela C, DO  latanoprost (XALATAN) 0.005 % ophthalmic solution INT 1 GTT INTO OU HS 02/16/18  Yes [provider]  metoprolol tartrate (LOPRESSOR) 25 MG tablet TAKE 1 TABLET BY MOUTH EVERY 6 HOURS AS NEEDED FOR HEART FLUTTERING 04/08/17  Yes Allred, Jeneen Rinks, MD  nitroGLYCERIN (NITROSTAT) 0.4 MG SL tablet Place 1 tablet (0.4 mg total) under the tongue every 5 (five) minutes as needed for chest pain. 01/18/15  Yes Eileen Stanford, PA-C  Olopatadine HCl 0.2 % SOLN INT 1 TO 2 GTS IN OU D PRF ITCHING 02/17/18  Yes [provider]  omeprazole (PRILOSEC) 20 MG capsule TAKE ONE CAPSULE BY MOUTH DAILY 02/02/18  Yes Riccio, Angela C, DO  OVER THE COUNTER MEDICATION Take 2 tablets by mouth every 4 (four) hours as needed (cough). Guaifenesin 400mg  Phenylephrine 10mg    Yes [provider]  polyethylene glycol (MIRALAX / GLYCOLAX) packet Take 17 g by mouth daily as needed for mild constipation.    Yes [provider]  potassium chloride (K-DUR) 10 MEQ tablet Take 1 tablet (10 mEq total) by mouth daily. Patient taking differently: Take 20 mEq by mouth daily.  02/05/18  Yes Riccio, Angela C, DO  predniSONE (DELTASONE) 5 MG tablet Take 10 mg by mouth daily.  01/01/18  Yes [provider]  senna (SENOKOT) 8.6 MG TABS tablet Take 1 tablet by mouth daily as needed for mild constipation.   Yes [provider]  triamcinolone cream (KENALOG) 0.1 % Apply 1 application topically 2 (two) times daily. 03/17/18  Yes Riccio, Angela C, DO  Guaifenesin 1200 MG TB12 Take 1 tablet (1,200 mg total) by mouth 2 (two) times daily. Patient not taking: Reported on 03/20/2018 03/17/18   Steve Rattler, DO    Family History Family History  Problem Relation Age of Onset  . Hypertension Mother   . Stroke Mother   . Heart disease Mother   . Aneurysm Mother   . CVA Mother   . Hypertension Father   . Stroke Father   . Heart attack Father   . Alcohol abuse Father   . Hypertension Sister   . Hypertension Brother   . Colon cancer Brother   . Cancer Brother   . Diabetes Sister   . Diabetes  Brother   . Heart attack Brother   . Heart attack Brother   . Heart disease Sister   . Alcohol abuse Brother   . Heart disease Daughter     Social History Social History   Tobacco  Use  . Smoking status: Former Smoker    Packs/day: 0.10    Years: 50.00    Pack years: 5.00    Types: Cigarettes    Last attempt to quit: 08/16/2011    Years since quitting: 6.5  . Smokeless tobacco: Never Used  . Tobacco comment: quit 2012  Substance Use Topics  . Alcohol use: No    Alcohol/week: 0.0 oz    Comment: quit 08/16/2011  . Drug use: No     Allergies   Ace inhibitors and Other   Review of Systems Review of Systems  Respiratory: Positive for shortness of breath.   All other systems reviewed and are negative.    Physical Exam Updated Vital Signs BP 119/66   Pulse 69   Resp (!) 21   Ht 5\' 4"  (1.626 m)   Wt 76.7 kg (169 lb)   SpO2 98%   BMI 29.01 kg/m   Physical Exam  Constitutional: She is oriented to person, place, and time. She appears well-developed and well-nourished.  HENT:  Head: Normocephalic and atraumatic.  Neck:  Tracheostomy in anterior neck.  4-0 uncuffed shiley with passymuir valve.   Cardiovascular: Normal rate and regular rhythm.  No murmur heard. Pulmonary/Chest: Effort normal and breath sounds normal. No respiratory distress.  Abdominal: Soft. There is no tenderness. There is no rebound and no guarding.  Musculoskeletal: She exhibits no edema.  Mild left calf tenderness  Neurological: She is alert and oriented to person, place, and time.  Skin: Skin is warm and dry.  Psychiatric: She has a normal mood and affect. Her behavior is normal.  Nursing note and vitals reviewed.    ED Treatments / Results  Labs (all labs ordered are listed, but only abnormal results are displayed) Labs Reviewed  BASIC METABOLIC PANEL - Abnormal; Notable for the following components:      Result Value   Chloride 98 (*)    Glucose, Bld 196 (*)    BUN 52 (*)     Creatinine, Ser 1.70 (*)    GFR calc non Af Amer 28 (*)    GFR calc Af Amer 33 (*)    All other components within normal limits  CBC WITH DIFFERENTIAL/PLATELET - Abnormal; Notable for the following components:   WBC 10.6 (*)    RBC 3.39 (*)    Hemoglobin 8.9 (*)    HCT 30.1 (*)    MCHC 29.6 (*)    Neutro Abs 8.6 (*)    All other components within normal limits  BRAIN NATRIURETIC PEPTIDE - Abnormal; Notable for the following components:   B Natriuretic Peptide 289.1 (*)    All other components within normal limits  D-DIMER, QUANTITATIVE (NOT AT [facility]) - Abnormal; Notable for the following components:   D-Dimer, Quant 2.61 (*)    All other components within normal limits  I-STAT TROPONIN, ED - Abnormal; Notable for the following components:   Troponin i, poc 0.18 (*)    All other components within normal limits  I-STAT TROPONIN, ED - Abnormal; Notable for the following components:   Troponin i, poc 0.13 (*)    All other components within normal limits    EKG EKG Interpretation  Date/Time:  Saturday March 20 2018 15:10:01 EDT Ventricular Rate:  70 PR Interval:    QRS Duration: 193 QT Interval:  509 QTC Calculation: 550 R Axis:   -84 Text Interpretation:  Ventricular-paced rhythm No further analysis attempted due to paced rhythm Confirmed by Quintella Reichert [id number]) on  03/20/2018 3:23:39 PM   Radiology Dg Chest Port 1 View  Result Date: 03/20/2018 CLINICAL DATA:  Shortness of breath for 3 days. EXAM: PORTABLE CHEST 1 VIEW COMPARISON:  January 05, 2018 FINDINGS: The heart size and mediastinal contours are stable. The heart size is enlarged. Cardiac pacemaker is unchanged. Tracheostomy tube is seen in good position. There is mild bilateral pulmonary edema. There is no focal pneumonia or pleural effusion. The visualized skeletal structures are stable. IMPRESSION: Mild congestive heart failure. Electronically Signed   By: Abelardo Diesel M.D.   On: 03/20/2018 16:19     Procedures Procedures (including critical care time)  Medications Ordered in ED Medications  furosemide (LASIX) injection 40 mg (40 mg Intravenous Given 03/20/18 1930)     Initial Impression / Assessment and Plan / ED Course  I have reviewed the triage vital signs and the nursing notes.  Pertinent labs & imaging results that were available during my care of the patient were reviewed by me and considered in my medical decision making (see chart for details).     Patient here for sob, has hx/o COPD and tracheostomy.  Sxs improved following solumedrol and albuterol provided by EMS prior to ED arrival.  On exam in ED there is good air movement bilaterally with no wheezes.  Given patient's complaint of calf pain ddimer obtained and was elevated.  No evidence of dvt to lower extremity on ultrasound.  Patient with CKD, creatinine at baseline - plan to obtain VQ scan to rule out PE but cannot obtain tonight.  She does have some mild chf of chest xray - treated with lasix x 1.  Family medicine consulted for observation admission pending VQ scan.  Patient updated of findings of studies and she is in agreement with plan.    Final Clinical Impressions(s) / ED Diagnoses   Final diagnoses:  None    ED Discharge Orders    None       Quintella Reichert, MD 03/20/18 2202

## 2018-03-20 NOTE — ED Notes (Signed)
Pt requesting trach O2 to be removed because its bothering her.

## 2018-03-20 NOTE — ED Notes (Signed)
Family at bedside. 

## 2018-03-20 NOTE — H&P (Signed)
[facility] Admission History and Physical Service Pager: [phone]  Patient name: [name] Medical record number: [id number] Date of birth: 03-17-1946 Age: 72 y.o. Gender: female  Primary Care Provider: Steve Rattler, DO Consultants: None Code Status: FULL   Chief Complaint: SOB x2 days and calf pain   Assessment and Plan: [name] is a 72 y.o. female presenting with shortness of breath and calf pain. PMH is significant for COPD, polymyalgia rheumatica, hyperlipidemia, hypertension, GERD, CHF, CKD stage III, sleep apnea, tracheostomy, and GAD.  Shortness of breath Shortness of breath much improved following Solu-Medrol and albuterol given per EMS on route to ED.  On arrival to the ER, patient feeling better.  Tachypneic to RR 22, however satting 96% on room air and BP 125/71.  Labs notable for mildly elevated white count of 10.6, SCr 1.70 and troponin 0.18 later downtrending to 0.13.  BNP elevated to 289 and CXR revealed mild congestive heart failure.  1 dose of IV 40 mg Lasix given in ED.  D-dimer obtained given complaint of calf pain, which was elevated to 2.61.  Vascular ultrasound of left lower extremity negative for DVT.  Patient with CKD stage III, creatinine is at baseline.  Unable to perform CTA due to contrast.  Plan to obtain VQ scan to rule out PE however unable to obtain tonight.  Discussed with ED provider Dr. Ralene Bathe, will admit as obs pending VQ scan.   Of note, patient with recent history of pneumonia and completed 14 days of ciprofloxacin without much improvement.  Last seen by PCP Dr. [name] on 03/17/2018 and endorsing cough with dark phlegm at that time, without fever or chills. -Admit to MedSurg-obs status, attending Dr. Mingo Amber -Plan for V/Q scan tomorrow -Supplemental O2 as needed - Cardiac monitoring - S/p Solu-Medrol and albuterol - Albuterol Q6 as needed -Pulse ox with vitals -Vitals per unit routine  Tracheostomy.   Placed November 2017 due to chronic upper airway narrowing.  Had a direct laryngoscopy with ablation of subepiglottic granulation tissue on 10/16/16 and failed extubation at that time.   -Trach care ordered  CHF.  Last echo 10/2016 with EF 55-60%, no regional wall motion abnormalities.  At home on 40 mg Lasix BID and Lopressor 25 mg every 6 hours as needed.  Patient appears euvolemic without obvious evidence of fluid overload on exam.  BNP elevated to 289.1.  Per chart review, appears to be elevated at baseline. -Continue home Lasix -Reassess fluid status in AM  CKD stage III.  Creatinine on admission 1.70.  Baseline creatinine appears to be 1.7-2.0. - Continue to monitor  Polymyalgia rheumatica.  Stable.  At home on prednisone 10 mg p.o. daily.  Per chart review, has not followed up with rheumatology since January.  Per PCP note 03/17/2018, patient advised to follow-up with rheumatology to discuss steroid taper and symptoms of body aches. - Continuing home prednisone  Hypertension.  Normotensive on admission with BP 125/71.  At home on Lasix for CHF. -Continue to monitor  Hyperlipidemia.  Last lipid panel 07/2017 with LDL 99, HDL 48, TG 103.  Statin not listed on patient med list.  Gout.  Stable.  At home on Allopurinol 100 mg.  -continue home Allopurinol  Anemia.   Hemoglobin 8.9 on admission.  At home on supplemental iron -FE sulfate 325 mg. -Continue p.o. iron  GERD.  Stable.  At home on omeprazole 20 mg daily -Substitute with Protonix 40 mg  GAD.  Stable.  Per chart review,  patient taking 0.5 mg Xanax at bedtime prn for anxiety.  She states does not need this tonight, only in certain situations such as when going to the store. -Will hold home benzo for now   FEN/GI: diet heart healthy  Prophylaxis: heparin  Disposition: Admit to obs for V/Q scan tomorrow, attending Dr. Mingo Amber  History of Present Illness:  [name] is a 72 y.o. female presenting with shortness of  breath for several days associated with chest discomfort.  Pain is described as substernal, 3/4 out of 10 and worse with coughing.  She also reports a productive cough with clear secretions but is unable to clear them easily.  Has a tracheostomy in place.  No fever.  She endorses some lower extremity swelling and left calf pain.  Has had workup with outpatient doctor however no clear diagnosis.  En route via EMS, she received Solu-Medrol and albuterol treatment which provided some improvement in her symptoms.  Review Of Systems: Per HPI with the following additions:  Review of Systems  Constitutional: Negative for chills and fever.  HENT: Positive for congestion.   Respiratory: Positive for cough, sputum production and shortness of breath.   Cardiovascular: Positive for chest pain.  Gastrointestinal: Negative for abdominal pain, diarrhea, nausea and vomiting.    Patient Active Problem List   Diagnosis Date Noted  . Shortness of breath 03/20/2018  . Acute bronchitis 03/03/2018  . Calculus of gallbladder without cholecystitis without obstruction 12/24/2017  . Chronic cough 09/25/2017  . History of colonic polyps   . Polyp of cecum   . Polyp of ascending colon   . Polyp of transverse colon   . Polyp of descending colon   . Polyp of rectum   . Moderate protein-calorie malnutrition ([place]) 06/02/2017  . Chronic idiopathic constipation 03/19/2017  . Loss of weight 01/26/2017  . 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 Permanent atrial fibrillation ([place])   . Laryngeal stenosis 09/18/2016  . Esophageal dysphagia   . Dyspnea on exertion   . Polymyalgia rheumatica ([place]) 04/10/2015  . Low back pain 03/28/2015  . Hypertrophic cardiomyopathy ([place]) 02/15/2015  . Eczema 09/18/2014  . Environmental allergies 09/18/2014  . Pre-diabetes 11/26/2013  . COPD, moderate ([place]) 11/01/2013  . Cough 07/15/2013  . Complete heart block ([place]) 04/03/2011  . Anemia 02/17/2011  . AVM (arteriovenous malformation)  02/13/2011  . PACEMAKER-[place] 11/28/2010  . CHRONIC KIDNEY DISEASE STAGE III (MODERATE) 09/24/2010  . Acute on chronic congestive heart failure ([place]) 08/30/2010  . ARTHRITIS 07/24/2010  . Generalized anxiety disorder 07/23/2010  . Hyperlipidemia 02/11/2007  . Essential hypertension 02/11/2007  . APNEA, SLEEP 02/11/2007    Past Medical History: Past Medical History:  Diagnosis Date  . Anemia   . Angina   . Angioedema    2/2 ACE  . Arteriovenous malformation of stomach   . Arthritis   . Asthma   . AVM (arteriovenous malformation) of colon    small intestine; stomach  . Blood transfusion   . CHF (congestive heart failure) ([place])   . Complete heart block ([place])    s/p PPM 1998  . Complication of anesthesia    Difficult airway; anaphylaxis and swelling with propofol  . DDD (degenerative disc disease)   . Depression   . Diastolic heart failure   . Fatty liver 07/26/10  . GERD (gastroesophageal reflux disease)   . GI bleed   . Headache   . History of alcohol abuse Stopped Fall 2012  . History of tobacco use Quit Fall 2012  .  Hx of cardiovascular stress test    a. Lexiscan Myoview (10/15):  Small inferolateral and apical defect c/w scar and poss soft tissue attenuation, no ischemia, EF 42%  . Hx of colonic polyp 08/13/10   adenomatous  . Hx of colonoscopy   . Hyperlipidemia   . Hyperlipidemia   . Hypertension   . Hypertrophic cardiomyopathy ([place])    dx by Dr [name] 2009  . Iron deficiency anemia   . Myocardial infarction ([place])   . Panic attack   . Panic attacks   . Permanent atrial fibrillation ([place])   . Pneumonia   . Renal failure    baseline creatinine 1.6  . Right arm pain 01/08/2012  . RLS (restless legs syndrome)    Dx 06/2007  . Shortness of breath    sob on exertation  . Sleep apnea     Past Surgical History: Past Surgical History:  Procedure Laterality Date  . CARDIAC CATHETERIZATION    . CARDIAC CATHETERIZATION N/A 08/19/2016   Procedure: Right/Left  Heart Cath and Coronary Angiography;  Surgeon: Jolaine Artist, MD;  Location: [facility];  Service: Cardiovascular;  Laterality: N/A;  . COLONOSCOPY WITH PROPOFOL N/A 06/16/2017   Procedure: COLONOSCOPY WITH PROPOFOL;  Surgeon: Mauri Pole, MD;  Location: [facility];  Service: Endoscopy;  Laterality: N/A;  . DIRECT LARYNGOSCOPY N/A 09/18/2016   Procedure: DIRECT LARYNGOSCOPY, BRONCHOSCOPY, REMOVAL OF INTUBATION GRANULOMA;  Surgeon: [name], MD;  Location[facility] OR;  Service: ENT;  Laterality: N/A;  . DIRECT LARYNGOSCOPY N/A 10/20/2016   Procedure: EXTUBATION AND FLEXIBLE LARYNGOSCOPE;  Surgeon: [name], MD;  Location: [facility];  Service: ENT;  Laterality: N/A;  . DIRECT LARYNGOSCOPY N/A 10/29/2016   Procedure: DIRECT LARYNGOSCOPY;  Surgeon: [name], MD;  Location: [facility] OR;  Service: ENT;  Laterality: N/A;  . ESOPHAGOGASTRODUODENOSCOPY  12/23/2011   Procedure: ESOPHAGOGASTRODUODENOSCOPY (EGD);  Surgeon: [name], MD;  Location: [facility];  Service: Endoscopy;  Laterality: N/A;  . ESOPHAGOGASTRODUODENOSCOPY (EGD) WITH PROPOFOL N/A 06/16/2017   Procedure: ESOPHAGOGASTRODUODENOSCOPY (EGD) WITH PROPOFOL;  Surgeon: Mauri Pole, MD;  Location: [facility];  Service: Endoscopy;  Laterality: N/A;  . EXTUBATION (ENDOTRACHEAL) IN OR N/A 07/21/2016   Procedure: EXTUBATION (ENDOTRACHEAL) IN OR;  Surgeon: [name], MD;  Location: [facility];  Service: ENT;  Laterality: N/A;  . GIVENS CAPSULE STUDY  12/23/2011   Procedure: GIVENS CAPSULE STUDY;  Surgeon: [name], MD;  Location: [facility];  Service: Endoscopy;  Laterality: N/A;  . MICROLARYNGOSCOPY WITH LASER N/A 10/16/2016   Procedure: MICRODIRECTLARYNGOSCOPY WITH LASER ABLATION AND KENLOG INJECTION;  Surgeon: [name], MD;  Location: [facility];  Service: ENT;  Laterality: N/A;  . PACEMAKER INSERTION  1998   st jude, most recent gen change by [name] 4/12  . TRACHEOSTOMY TUBE PLACEMENT N/A 10/29/2016   Procedure:  TRACHEOSTOMY;  Surgeon: [name], MD;  Location: [facility] OR;  Service: ENT;  Laterality: N/A;  . TUBAL LIGATION  04/01/2000    Social History: Social History   Tobacco Use  . Smoking status: Former Smoker    Packs/day: 0.10    Years: 50.00    Pack years: 5.00    Types: Cigarettes    Last attempt to quit: 08/16/2011    Years since quitting: 6.6  . Smokeless tobacco: Never Used  . Tobacco comment: quit 2012  Substance Use Topics  . Alcohol use: No    Alcohol/week: 0.0 oz    Comment: quit 08/16/2011  . Drug  use: No   Additional social history: Lives by herself at home but son and daughter helping her.  H/o tobacco use, quit 3-4 years ago.  Former alcohol use but quit 3 years ago.  Denies illicit drug use.   Please also refer to relevant sections of EMR.  Family History: Family History  Problem Relation Age of Onset  . Hypertension Mother   . Stroke Mother   . Heart disease Mother   . Aneurysm Mother   . CVA Mother   . Hypertension Father   . Stroke Father   . Heart attack Father   . Alcohol abuse Father   . Hypertension Sister   . Hypertension Brother   . Colon cancer Brother   . Cancer Brother   . Diabetes Sister   . Diabetes Brother   . Heart attack Brother   . Heart attack Brother   . Heart disease Sister   . Alcohol abuse Brother   . Heart disease Daughter    Allergies and Medications: Allergies  Allergen Reactions  . Ace Inhibitors Swelling    angiodema  . Other Anaphylaxis and Swelling    Reaction to unspecified anesthesia    No current facility-administered medications on file prior to encounter.    Current Outpatient Medications on File Prior to Encounter  Medication Sig Dispense Refill  . acetaminophen (TYLENOL) 325 MG tablet Take 325-650 mg by mouth every 6 (six) hours as needed for mild pain, fever or headache.     . albuterol (PROVENTIL HFA;VENTOLIN HFA) 108 (90 Base) MCG/ACT inhaler Inhale 1-2 puffs into the lungs every 6 (six) hours as needed for  wheezing or shortness of breath. 1 Inhaler 6  . albuterol (PROVENTIL) (2.5 MG/3ML) 0.083% nebulizer solution Take 3 mLs (2.5 mg total) by nebulization every 6 (six) hours as needed for wheezing or shortness of breath. 75 mL 2  . allopurinol (ZYLOPRIM) 100 MG tablet Take 200 mg by mouth daily.  2  . ALPRAZolam (XANAX) 0.5 MG tablet TAKE 1 TABLET BY MOUTH AT BEDTIME AS NEEDED FOR ANXIETY 15 tablet 0  . aspirin EC 81 MG tablet Take 81 mg by mouth daily.     . cetirizine (ZYRTEC) 10 MG tablet Take 1 tablet (10 mg total) by mouth daily. 30 tablet 2  . Cholecalciferol (VITAMIN D3) 5000 units CAPS Take 5,000 Units by mouth daily.    . ferrous sulfate 325 (65 FE) MG tablet TAKE 1 TABLET BY MOUTH TWICE DAILY WITH MEALS TO KEEP BLOOD COUNT UP (Patient taking differently: Take 325 mg by mouth 2 (two) times daily with a meal. TO KEEP BLOOD COUNT UP) 60 tablet 11  . furosemide (LASIX) 40 MG tablet Take 1 tablet (40 mg total) by mouth 2 (two) times daily. 180 tablet 3  . latanoprost (XALATAN) 0.005 % ophthalmic solution INT 1 GTT INTO OU HS  3  . metoprolol tartrate (LOPRESSOR) 25 MG tablet TAKE 1 TABLET BY MOUTH EVERY 6 HOURS AS NEEDED FOR HEART FLUTTERING 360 tablet 3  . nitroGLYCERIN (NITROSTAT) 0.4 MG SL tablet Place 1 tablet (0.4 mg total) under the tongue every 5 (five) minutes as needed for chest pain. 25 tablet 1  . Olopatadine HCl 0.2 % SOLN INT 1 TO 2 GTS IN OU D PRF ITCHING  1  . omeprazole (PRILOSEC) 20 MG capsule TAKE ONE CAPSULE BY MOUTH DAILY 30 capsule 0  . OVER THE COUNTER MEDICATION Take 2 tablets by mouth every 4 (four) hours as needed (cough). Guaifenesin 400mg   Phenylephrine 10mg     . polyethylene glycol (MIRALAX / GLYCOLAX) packet Take 17 g by mouth daily as needed for mild constipation.     . potassium chloride (K-DUR) 10 MEQ tablet Take 1 tablet (10 mEq total) by mouth daily. (Patient taking differently: Take 20 mEq by mouth daily. ) 90 tablet 3  . predniSONE (DELTASONE) 5 MG tablet Take  10 mg by mouth daily.   3  . senna (SENOKOT) 8.6 MG TABS tablet Take 1 tablet by mouth daily as needed for mild constipation.    . triamcinolone cream (KENALOG) 0.1 % Apply 1 application topically 2 (two) times daily. 30 g 0  . Guaifenesin 1200 MG TB12 Take 1 tablet (1,200 mg total) by mouth 2 (two) times daily. (Patient not taking: Reported on 03/20/2018) 60 each 0    Objective: BP 129/72   Pulse 70   Resp (!) 25   Ht 5\' 4"  (1.626 m)   Wt 169 lb (76.7 kg)   SpO2 100%   BMI 29.01 kg/m  Exam: General: Pleasant 72 year old female, NAD Eyes: EOMI, PERRLA ENTM: MMM, oropharynx clear without tonsillar erythema or exudate Neck: Supple, no JVD, tracheostomy tube in anterior neck.  400 uncuffed [place] with Passy-Muir valve Cardiovascular: RRR no MRG, 2+ pedal pulses Respiratory: CTA B, no crackles or wheeze present Gastrointestinal: Soft, NT ND, no rebound or guarding present, positive bowel sounds MSK: Mild left calf tenderness to palpation, no erythema or warmth of lower leg.  No edema in bilateral lower extremities Derm: Warm, dry, brisk cap refill Neuro: Alert, oriented x3, no focal deficits, strength 5/5 bilaterally in upper and lower extremities, normal sensation Psych: Normal mood and affect  Labs and Imaging: CBC BMET  Recent Labs  Lab 03/20/18 1532  WBC 10.6*  HGB 8.9*  HCT 30.1*  PLT 313   Recent Labs  Lab 03/20/18 1532  NA 137  K 3.9  CL 98*  CO2 24  BUN 52*  CREATININE 1.70*  GLUCOSE 196*  CALCIUM 9.4     Dg Chest Port 1 View  Result Date: 03/20/2018 CLINICAL DATA:  Shortness of breath for 3 days. EXAM: PORTABLE CHEST 1 VIEW COMPARISON:  January 05, 2018 FINDINGS: The heart size and mediastinal contours are stable. The heart size is enlarged. Cardiac pacemaker is unchanged. Tracheostomy tube is seen in good position. There is mild bilateral pulmonary edema. There is no focal pneumonia or pleural effusion. The visualized skeletal structures are stable. IMPRESSION:  Mild congestive heart failure. Electronically Signed   By: Abelardo Diesel M.D.   On: 03/20/2018 16:19    Lovenia Kim, MD 03/21/2018, 12:14 AM PGY-2, [place] Intern pager: [phone], text pages welcome

## 2018-03-20 NOTE — ED Notes (Signed)
Called respiratory to request humidified trach collar for pt who wears supplemental oxygen at home.

## 2018-03-21 ENCOUNTER — Other Ambulatory Visit: Payer: Self-pay

## 2018-03-21 ENCOUNTER — Observation Stay (HOSPITAL_COMMUNITY): Payer: Medicare Other

## 2018-03-21 DIAGNOSIS — J441 Chronic obstructive pulmonary disease with (acute) exacerbation: Secondary | ICD-10-CM | POA: Diagnosis not present

## 2018-03-21 DIAGNOSIS — R0602 Shortness of breath: Secondary | ICD-10-CM

## 2018-03-21 DIAGNOSIS — M7989 Other specified soft tissue disorders: Secondary | ICD-10-CM | POA: Diagnosis not present

## 2018-03-21 DIAGNOSIS — I442 Atrioventricular block, complete: Secondary | ICD-10-CM | POA: Diagnosis not present

## 2018-03-21 DIAGNOSIS — N183 Chronic kidney disease, stage 3 (moderate): Secondary | ICD-10-CM | POA: Diagnosis not present

## 2018-03-21 DIAGNOSIS — Z430 Encounter for attention to tracheostomy: Secondary | ICD-10-CM | POA: Diagnosis not present

## 2018-03-21 DIAGNOSIS — I482 Chronic atrial fibrillation: Secondary | ICD-10-CM | POA: Diagnosis not present

## 2018-03-21 DIAGNOSIS — I422 Other hypertrophic cardiomyopathy: Secondary | ICD-10-CM | POA: Diagnosis not present

## 2018-03-21 DIAGNOSIS — M353 Polymyalgia rheumatica: Secondary | ICD-10-CM | POA: Diagnosis not present

## 2018-03-21 LAB — BASIC METABOLIC PANEL
Anion gap: 12 (ref 5–15)
BUN: 54 mg/dL — AB (ref 6–20)
CALCIUM: 9.6 mg/dL (ref 8.9–10.3)
CO2: 26 mmol/L (ref 22–32)
Chloride: 101 mmol/L (ref 101–111)
Creatinine, Ser: 1.78 mg/dL — ABNORMAL HIGH (ref 0.44–1.00)
GFR calc Af Amer: 31 mL/min — ABNORMAL LOW (ref >60)
GFR, EST NON AFRICAN AMERICAN: 27 mL/min — AB (ref >60)
Glucose, Bld: 150 mg/dL — ABNORMAL HIGH (ref 65–99)
POTASSIUM: 4.3 mmol/L (ref 3.5–5.1)
SODIUM: 139 mmol/L (ref 135–145)

## 2018-03-21 LAB — CBC
HCT: 29.4 % — ABNORMAL LOW (ref 36.0–46.0)
Hemoglobin: 8.7 g/dL — ABNORMAL LOW (ref 12.0–15.0)
MCH: 26.2 pg (ref 26.0–34.0)
MCHC: 29.6 g/dL — ABNORMAL LOW (ref 30.0–36.0)
MCV: 88.6 fL (ref 78.0–100.0)
PLATELETS: 313 10*3/uL (ref 150–400)
RBC: 3.32 MIL/uL — ABNORMAL LOW (ref 3.87–5.11)
RDW: 15.1 % (ref 11.5–15.5)
WBC: 6.5 10*3/uL (ref 4.0–10.5)

## 2018-03-21 LAB — TROPONIN I
TROPONIN I: 0.18 ng/mL — AB (ref <0.03)
TROPONIN I: 0.18 ng/mL — AB (ref <0.03)

## 2018-03-21 MED ORDER — POLYETHYLENE GLYCOL 3350 17 G PO PACK: 17.0000 g | PACK | Freq: Every day | ORAL | Status: DC | PRN

## 2018-03-21 MED ORDER — HEPARIN SODIUM (PORCINE) 5000 UNIT/ML IJ SOLN
5000.0000 [IU] | Freq: Three times a day (TID) | INTRAMUSCULAR | Status: DC
Start: 2018-03-21 — End: 2018-03-22
  Administered 2018-03-21 – 2018-03-22 (×5): 5000 [IU] via SUBCUTANEOUS
  Filled 2018-03-21 (×4): qty 1

## 2018-03-21 MED ORDER — PREDNISONE 50 MG PO TABS
50.0000 mg | ORAL_TABLET | Freq: Every day | ORAL | Status: DC
  Administered 2018-03-22: 50 mg via ORAL
  Filled 2018-03-21: qty 1

## 2018-03-21 MED ORDER — ONDANSETRON HCL 4 MG/2ML IJ SOLN: 4.0000 mg | Freq: Four times a day (QID) | INTRAMUSCULAR | Status: DC | PRN

## 2018-03-21 MED ORDER — ALBUTEROL SULFATE HFA 108 (90 BASE) MCG/ACT IN AERS: 1.0000 | INHALATION_SPRAY | Freq: Four times a day (QID) | RESPIRATORY_TRACT | Status: DC | PRN

## 2018-03-21 MED ORDER — POTASSIUM CHLORIDE CRYS ER 10 MEQ PO TBCR
20.0000 meq | EXTENDED_RELEASE_TABLET | Freq: Every day | ORAL | Status: DC
  Administered 2018-03-21 – 2018-03-22 (×2): 20 meq via ORAL
  Filled 2018-03-21 (×2): qty 2

## 2018-03-21 MED ORDER — FUROSEMIDE 40 MG PO TABS
40.0000 mg | ORAL_TABLET | Freq: Two times a day (BID) | ORAL | Status: DC
  Administered 2018-03-21 – 2018-03-22 (×4): 40 mg via ORAL
  Filled 2018-03-21 (×4): qty 1

## 2018-03-21 MED ORDER — TECHNETIUM TO 99M ALBUMIN AGGREGATED
4.4000 | Freq: Once | INTRAVENOUS | Status: AC | PRN
  Administered 2018-03-21: 4.4 via INTRAVENOUS

## 2018-03-21 MED ORDER — LEVOFLOXACIN 750 MG PO TABS
750.0000 mg | ORAL_TABLET | ORAL | Status: DC
Start: 2018-03-21 — End: 2018-03-22
  Administered 2018-03-21: 750 mg via ORAL
  Filled 2018-03-21: qty 1

## 2018-03-21 MED ORDER — ACETAMINOPHEN 325 MG PO TABS: 650.0000 mg | ORAL_TABLET | Freq: Four times a day (QID) | ORAL | Status: DC | PRN

## 2018-03-21 MED ORDER — ALPRAZOLAM 0.5 MG PO TABS
0.5000 mg | ORAL_TABLET | Freq: Every evening | ORAL | Status: DC | PRN
  Administered 2018-03-21: 0.5 mg via ORAL
  Filled 2018-03-21: qty 1

## 2018-03-21 MED ORDER — PANTOPRAZOLE SODIUM 40 MG PO TBEC
40.0000 mg | DELAYED_RELEASE_TABLET | Freq: Every day | ORAL | Status: DC
  Administered 2018-03-21 – 2018-03-22 (×2): 40 mg via ORAL
  Filled 2018-03-21 (×2): qty 1

## 2018-03-21 MED ORDER — PREDNISONE 20 MG PO TABS
40.0000 mg | ORAL_TABLET | Freq: Once | ORAL | Status: AC
  Administered 2018-03-21: 40 mg via ORAL
  Filled 2018-03-21: qty 2

## 2018-03-21 MED ORDER — ALLOPURINOL 100 MG PO TABS
200.0000 mg | ORAL_TABLET | Freq: Every day | ORAL | Status: DC
  Administered 2018-03-21 – 2018-03-22 (×2): 200 mg via ORAL
  Filled 2018-03-21 (×3): qty 2

## 2018-03-21 MED ORDER — ACETAMINOPHEN 650 MG RE SUPP: 650.0000 mg | Freq: Four times a day (QID) | RECTAL | Status: DC | PRN

## 2018-03-21 MED ORDER — PREDNISONE 10 MG PO TABS
10.0000 mg | ORAL_TABLET | Freq: Every day | ORAL | Status: DC
  Administered 2018-03-21: 10 mg via ORAL
  Filled 2018-03-21: qty 1

## 2018-03-21 MED ORDER — ONDANSETRON HCL 4 MG PO TABS: 4.0000 mg | ORAL_TABLET | Freq: Four times a day (QID) | ORAL | Status: DC | PRN

## 2018-03-21 MED ORDER — FERROUS SULFATE 325 (65 FE) MG PO TABS
325.0000 mg | ORAL_TABLET | Freq: Two times a day (BID) | ORAL | Status: DC
  Administered 2018-03-21 – 2018-03-22 (×4): 325 mg via ORAL
  Filled 2018-03-21 (×4): qty 1

## 2018-03-21 MED ORDER — IPRATROPIUM-ALBUTEROL 0.5-2.5 (3) MG/3ML IN SOLN
3.0000 mL | Freq: Four times a day (QID) | RESPIRATORY_TRACT | Status: DC | PRN
  Administered 2018-03-21: 3 mL via RESPIRATORY_TRACT
  Filled 2018-03-21: qty 3

## 2018-03-21 MED ORDER — ALBUTEROL SULFATE (2.5 MG/3ML) 0.083% IN NEBU: 2.5000 mg | INHALATION_SOLUTION | Freq: Four times a day (QID) | RESPIRATORY_TRACT | Status: DC | PRN

## 2018-03-21 MED ORDER — ASPIRIN EC 81 MG PO TBEC
81.0000 mg | DELAYED_RELEASE_TABLET | Freq: Every day | ORAL | Status: DC
  Administered 2018-03-21 – 2018-03-22 (×2): 81 mg via ORAL
  Filled 2018-03-21 (×2): qty 1

## 2018-03-21 NOTE — Progress Notes (Signed)
Critical Value MD made aware of a troponin of 0.18, no new orders placed. Will continue to monitor

## 2018-03-21 NOTE — Progress Notes (Signed)
Family Medicine Teaching Service Daily Progress Note Intern Pager: [phone]  Patient name: [name] Medical record number: [id number] Date of birth: March 01, 1946 Age: 72 y.o. Gender: female  Primary Care Provider: Steve Rattler, DO Consultants: None Code Status: FULL  Pt Overview and Major Events to Date:  4/6: Admit to FMTS for SOB   Assessment and Plan: [name] is a 72 y.o. female presenting with shortness of breath and calf pain. PMH is significant for COPD, polymyalgia rheumatica, hyperlipidemia, hypertension, GERD, CHF, CKD stage III, sleep apnea, tracheostomy, and GAD.  Shortness of breath: Likely secondary to COPD exacerbation due to wheezing, productive cough, and significant improvement in symptoms with solumedrol and albuterol given by EMS. V/Q scan 4/7 was a poor study because unable to do ventilation imaging with trach. No PE found, but cannot completely rule out. Wells score is 0 and LLE doppler negative for DVT. CXR with mild CHF. BNP 289.1 (BNP has been 100-400 in the past). Cardiac etiology less likely without chest pain or ST/T wave changes on EKG. I-stat trops 0.18>0.13. - Monitor respiratory status closely - ECHO ordered - On Prednisone 10mg  at home. Will increase to Prednisone 50mg  daily x 5 days - Start Levaquin 750mg  PO every other day x 5 days (due to renal impairment) - Continue home Lasix 40mg  PO bid - Duonebs q6hrs prn - Trend troponins - Supplemental O2 as needed - Cardiac monitoring - Albuterol Q6 as needed - Pulse ox with vitals - Vitals per unit routine  Tracheostomy.  Placed November 2017 due to chronic upper airway narrowing.  Had a direct laryngoscopy with ablation of subepiglottic granulation tissue on 10/16/16 and failed extubation at that time.   -Trach care ordered  CHF.  Last echo 10/2016 with EF 55-60%, no regional wall motion abnormalities.  -Continue home Lasix 40mg  bid -ECHO pending  CKD stage III.   Creatinine on admission 1.70 (at baseline) - Continue to monitor  Polymyalgia rheumatica.  Stable.  At home on prednisone 10 mg p.o. daily.  Per chart review, has not followed up with rheumatology since January.  Per PCP note 03/17/2018, patient advised to follow-up with rheumatology to discuss steroid taper and symptoms of body aches. - Increase Prednisone to 50mg  x 5 days, then plan to continue home prednisone 10mg  daily  Hypertension.  Normotensive this admission. At home on Lasix for CHF. -Continue to monitor  Hyperlipidemia.  Last lipid panel 07/2017 with LDL 99, HDL 48, TG 103.  Statin not listed on patient med list.  Gout.  Stable. At home on Allopurinol 100 mg.  -continue home Allopurinol  Normocytic anemia.  Hemoglobin 8.9 on admission, stable at 8.7 today.  At home on ferrous sulfate 325mg  bid -Continue p.o. Iron - Repeat iron studies as an outpatient  GERD.  Stable. At home on omeprazole 20 mg daily -Substitute with Protonix 40 mg  GAD.  Stable. Per chart review, patient taking 0.5 mg Xanax prn for anxiety. -Will hold home benzo for now  FEN/GI: diet heart healthy  Prophylaxis: heparin  Disposition: Pending improvement in symptoms. Anticipate discharge home in the next 1-2 days.  Subjective:  States she is feeling a little better this morning. States she is having yellow sputum coming out of her trach. No fevers.  Objective: Temp:  [97.6 F (36.4 C)-97.8 F (36.6 C)] 97.8 F (36.6 C) (04/07 0928) Pulse Rate:  [68-77] 70 (04/07 0928) Resp:  [14-25] 20 (04/07 0928) BP: (103-145)/(51-88) 126/71 (04/07 0928) SpO2:  [91 %-100 %] 100 % (  04/07 0928) Weight:  [169 lb (76.7 kg)-169 lb 1.5 oz (76.7 kg)] 169 lb 1.5 oz (76.7 kg) (04/07 0500) Physical Exam: General: Pleasant female, NAD HEENT: [place]/AT, EOMI, MMM Neck: trach in place Cardiovascular: RRR, no murmurs Respiratory: CTAB, normal work of breathing  Laboratory: Recent Labs  Lab 03/20/18 1532  03/21/18 0816  WBC 10.6* 6.5  HGB 8.9* 8.7*  HCT 30.1* 29.4*  PLT 313 313   Recent Labs  Lab 03/20/18 1532 03/21/18 0816  NA 137 139  K 3.9 4.3  CL 98* 101  CO2 24 26  BUN 52* 54*  CREATININE 1.70* 1.78*  CALCIUM 9.4 9.6  GLUCOSE 196* 150*    Imaging/Diagnostic Tests: V/Q scan: several perfusion defects of both lungs, unable to do ventilation imaging, cannot completely exclude PE  [name], Pete Pelt, MD 03/21/2018, 10:00 AM PGY-3, [place] Intern pager: [phone], text pages welcome

## 2018-03-21 NOTE — Care Management Obs Status (Signed)
[place] NOTIFICATION   Patient Details  Name: [name] MRN: [id number] Date of Birth: 1946-11-10   Medicare Observation Status Notification Given:  Yes    Dawayne Patricia, RN 03/21/2018, 2:20 PM

## 2018-03-21 NOTE — Progress Notes (Signed)
New Admission Note:  Arrival Method: By bed from ED around [place] Orientation: Alert and oriented x4 Telemetry: Box 20, CCMD notified Assessment: Completed Skin: Completed, refer to flowsheets IV: Left AC S.L. Pain: Denies Tubes: Trach pt since November 2017 Safety Measures: Safety Fall Prevention Plan was given, discussed Admission: Completed [facility] Orientation: Patient has been orientated to the room, unit and the staff. Family: None at bedside  Orders have been reviewed and implemented. Will continue to monitor the patient. Call light has been placed within reach.  Perry Mount, RN  Phone Number: [phone]

## 2018-03-22 ENCOUNTER — Observation Stay (HOSPITAL_BASED_OUTPATIENT_CLINIC_OR_DEPARTMENT_OTHER): Payer: Medicare Other

## 2018-03-22 DIAGNOSIS — R0600 Dyspnea, unspecified: Secondary | ICD-10-CM | POA: Diagnosis not present

## 2018-03-22 DIAGNOSIS — N183 Chronic kidney disease, stage 3 (moderate): Secondary | ICD-10-CM | POA: Diagnosis not present

## 2018-03-22 DIAGNOSIS — J441 Chronic obstructive pulmonary disease with (acute) exacerbation: Secondary | ICD-10-CM | POA: Diagnosis not present

## 2018-03-22 LAB — BASIC METABOLIC PANEL
ANION GAP: 12 (ref 5–15)
BUN: 51 mg/dL — ABNORMAL HIGH (ref 6–20)
CHLORIDE: 98 mmol/L — AB (ref 101–111)
CO2: 27 mmol/L (ref 22–32)
CREATININE: 1.83 mg/dL — AB (ref 0.44–1.00)
Calcium: 9.9 mg/dL (ref 8.9–10.3)
GFR calc non Af Amer: 26 mL/min — ABNORMAL LOW (ref >60)
GFR, EST AFRICAN AMERICAN: 30 mL/min — AB (ref >60)
Glucose, Bld: 181 mg/dL — ABNORMAL HIGH (ref 65–99)
Potassium: 4.5 mmol/L (ref 3.5–5.1)
SODIUM: 137 mmol/L (ref 135–145)

## 2018-03-22 LAB — CBC
HEMATOCRIT: 30.3 % — AB (ref 36.0–46.0)
HEMOGLOBIN: 9.2 g/dL — AB (ref 12.0–15.0)
MCH: 26.6 pg (ref 26.0–34.0)
MCHC: 30.4 g/dL (ref 30.0–36.0)
MCV: 87.6 fL (ref 78.0–100.0)
Platelets: 323 10*3/uL (ref 150–400)
RBC: 3.46 MIL/uL — AB (ref 3.87–5.11)
RDW: 15.1 % (ref 11.5–15.5)
WBC: 8.5 10*3/uL (ref 4.0–10.5)

## 2018-03-22 LAB — ECHOCARDIOGRAM COMPLETE
Height: 64 in
Weight: 2705.49 oz

## 2018-03-22 LAB — TROPONIN I: TROPONIN I: 0.17 ng/mL — AB (ref <0.03)

## 2018-03-22 MED ORDER — LEVOFLOXACIN 500 MG PO TABS: 500.0000 mg | ORAL_TABLET | ORAL | Status: DC

## 2018-03-22 MED ORDER — LEVOFLOXACIN 500 MG PO TABS: 500.0000 mg | ORAL_TABLET | ORAL | 0 refills | Status: AC

## 2018-03-22 MED ORDER — PREDNISONE 50 MG PO TABS: 50.0000 mg | ORAL_TABLET | Freq: Every day | ORAL | 0 refills | Status: AC

## 2018-03-22 NOTE — Discharge Instructions (Signed)
Chronic Obstructive Pulmonary Disease Exacerbation  Chronic obstructive pulmonary disease (COPD) is a common lung problem. In COPD, the flow of air from the lungs is limited. COPD exacerbations are times that breathing gets worse and you need extra treatment. Without treatment they can be life threatening. If they happen often, your lungs can become more damaged. If your COPD gets worse, your doctor may treat you with:  ? Medicines.  ? Oxygen.  ? Different ways to clear your airway, such as using a mask.    Follow these instructions at home:  ? Do not smoke.  ? Avoid tobacco smoke and other things that bother your lungs.  ? If given, take your antibiotic medicine as told. Finish the medicine even if you start to feel better.  ? Only take medicines as told by your doctor.  ? Drink enough fluids to keep your pee (urine) clear or pale yellow (unless your doctor has told you not to).  ? Use a cool mist machine (vaporizer).  ? If you use oxygen or a machine that turns liquid medicine into a mist (nebulizer), continue to use them as told.  ? Keep up with shots (vaccinations) as told by your doctor.  ? Exercise regularly.  ? Eat healthy foods.  ? Keep all doctor visits as told.  Get help right away if:  ? You are very short of breath and it gets worse.  ? You have trouble talking.  ? You have bad chest pain.  ? You have blood in your spit (sputum).  ? You have a fever.  ? You keep throwing up (vomiting).  ? You feel weak, or you pass out (faint).  ? You feel confused.  ? You keep getting worse.  This information is not intended to replace advice given to you by your health care provider. Make sure you discuss any questions you have with your health care provider.  Document Released: 11/20/2011 Document Revised: 05/08/2016 Document Reviewed: 08/05/2013  Elsevier Interactive Patient Education ? 2017 [employer]

## 2018-03-22 NOTE — Discharge Summary (Signed)
[facility] Discharge Summary  Patient name: [name] Medical record number: [id number] Date of birth: 1946/02/03 Age: 72 y.o. Gender: female Date of Admission: 03/20/2018  Date of Discharge: 03/22/2018  Admitting Physician: Alveda Reasons, MD  Primary Care Provider: Steve Rattler, DO Consultants: None  Indication for Hospitalization: COPD exacerbation  Discharge Diagnoses/Problem List:  Patient Active Problem List   Diagnosis Date Noted  . Tracheostomy care ([place])   . Shortness of breath 03/20/2018  . Acute bronchitis 03/03/2018  . Calculus of gallbladder without cholecystitis without obstruction 12/24/2017  . Chronic cough 09/25/2017  . History of colonic polyps   . Polyp of cecum   . Polyp of ascending colon   . Polyp of transverse colon   . Polyp of descending colon   . Polyp of rectum   . Moderate protein-calorie malnutrition ([place]) 06/02/2017  . Chronic idiopathic constipation 03/19/2017  . Loss of weight 01/26/2017  . 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 Permanent atrial fibrillation ([place])   . Laryngeal stenosis 09/18/2016  . Esophageal dysphagia   . COPD exacerbation ([place])   . Dyspnea on exertion   . Polymyalgia rheumatica ([place]) 04/10/2015  . Low back pain 03/28/2015  . Hypertrophic cardiomyopathy ([place]) 02/15/2015  . Eczema 09/18/2014  . Environmental allergies 09/18/2014  . Pre-diabetes 11/26/2013  . COPD, moderate ([place]) 11/01/2013  . Cough 07/15/2013  . Complete heart block ([place]) 04/03/2011  . Anemia 02/17/2011  . AVM (arteriovenous malformation) 02/13/2011  . PACEMAKER-[place] 11/28/2010  . CKD (chronic kidney disease) stage 3, GFR 30-59 ml/min ([facility]) 09/24/2010  . Acute on chronic congestive heart failure ([place]) 08/30/2010  . ARTHRITIS 07/24/2010  . Generalized anxiety disorder 07/23/2010  . Hyperlipidemia 02/11/2007  . Essential hypertension 02/11/2007  . APNEA, SLEEP 02/11/2007   Disposition: Home  Discharge  Condition: Improved   Discharge Exam:  General: NAD, well appearing CVS: RRR, no MRG Lungs: Bronchovesicular breath sounds throughout all lung fields without wheezing, no increased work of breathing Abdomen: Soft, nontender, nondistended MSK: No lower extremity edema, 2+ dp  Brief Hospital Course:  [name] is a 72 y.o. female who presented with shortness of breath cough and chest discomfort.  Patient had elevated troponin 0.18 on admission however this was well below her baseline of 0.5.  Chest x-ray showed mild congestive heart failure.  Patient was evaluated with a d-dimer was elevated 2.6.  Was unable to perform CTA due to contrast load counterindication in setting of chronic kidney failure.  VQ scan was attempted however the ventilation portion was disrupted by patient's tracheostomy tube.  Was consistent with COPD exacerbation.  Treatment with IV Solu-Medrol and IV Levaquin was initiated.  Patient was treated with albuterol nebs as needed and pulmonary support.  Patient was transitioned to oral prednisone 50 and Levaquin Every 48 hours to complete a full 5-day course of both antibiotics and steroids.  Patient is to resume regular dose of steroids of 10 mg a day after prednisone burst.    Issues for Follow Up:  1. Given patient's last echocardiogram was obtained in 2017 it was repeated in the hospital.  Results were pending at time of discharge.  Significant Procedures: None  Significant Labs and Imaging:  Recent Labs  Lab 03/20/18 1532 03/21/18 0816 03/22/18 0309  WBC 10.6* 6.5 8.5  HGB 8.9* 8.7* 9.2*  HCT 30.1* 29.4* 30.3*  PLT 313 313 323   Recent Labs  Lab 03/20/18 1532 03/21/18 0816 03/22/18 0309  NA 137 139 137  K 3.9 4.3 4.5  CL 98* 101 98*  CO2 24 26 27   GLUCOSE 196* 150* 181*  BUN 52* 54* 51*  CREATININE 1.70* 1.78* 1.83*  CALCIUM 9.4 9.6 9.9  Nm Pulmonary Perfusion  Addendum Date: 03/21/2018   ADDENDUM REPORT: 03/21/2018 10:01 ADDENDUM: These  findings were discussed with the referring physician, Dr. Isidoro Donning Electronically Signed   By: Dorise Bullion III M.D   On: 03/21/2018 10:01   Result Date: 03/21/2018 CLINICAL DATA:  Shortness of breath.  Lower extremity swelling. EXAM: NUCLEAR MEDICINE VENTILATION AND PERFUSION SCAN TECHNIQUE: Perfusion images were obtained in multiple projections after intravenous injection of radiopharmaceutical. Sequential dynamic ventilation images were obtained in standard planar projections following inhalation of radiopharmaceutical. RADIOPHARMACEUTICALS:  4.4 mCi Tc68m MAA-IV COMPARISON:  None. FINDINGS: The patient has a tracheostomy. As a result, the ventilation portion of the study could not be performed. The lack of ventilation imaging significantly limits evaluation. There is a small defect at the right apex on the right anterior oblique image a and a mild defect at the lateral aspect of the right posterior lung best seen on the right posterior oblique image. A few small and moderate-sized defects are seen on the left as well. IMPRESSION: There are several defects on the perfusion imaging of both lungs. Given the lack of ventilation imaging, it cannot be determined whether these defects are matched or unmatched. As a result, pulmonary emboli cannot be excluded. That being said, the study is not diagnostic of PE either given lack of ventilation imaging and the findings could be due to underlying airways disease. The findings will be called to the referring clinical team. Electronically Signed: By: Dorise Bullion III M.D On: 03/21/2018 09:32   Dg Chest Port 1 View  Result Date: 03/20/2018 CLINICAL DATA:  Shortness of breath for 3 days. EXAM: PORTABLE CHEST 1 VIEW COMPARISON:  January 05, 2018 FINDINGS: The heart size and mediastinal contours are stable. The heart size is enlarged. Cardiac pacemaker is unchanged. Tracheostomy tube is seen in good position. There is mild bilateral pulmonary edema. There is no  focal pneumonia or pleural effusion. The visualized skeletal structures are stable. IMPRESSION: Mild congestive heart failure. Electronically Signed   By: Abelardo Diesel M.D.   On: 03/20/2018 16:19    Results/Tests Pending at Time of Discharge:   Unresulted Labs (From admission, onward)   None       Discharge Medications:  Allergies as of 03/22/2018      Reactions   Ace Inhibitors Swelling   angiodema   Other Anaphylaxis, Swelling   Reaction to unspecified anesthesia       Medication List    TAKE these medications   acetaminophen 325 MG tablet Commonly known as:  TYLENOL Take 325-650 mg by mouth every 6 (six) hours as needed for mild pain, fever or headache.   albuterol (2.5 MG/3ML) 0.083% nebulizer solution Commonly known as:  PROVENTIL Take 3 mLs (2.5 mg total) by nebulization every 6 (six) hours as needed for wheezing or shortness of breath.   albuterol 108 (90 Base) MCG/ACT inhaler Commonly known as:  PROVENTIL HFA;VENTOLIN HFA Inhale 1-2 puffs into the lungs every 6 (six) hours as needed for wheezing or shortness of breath.   allopurinol 100 MG tablet Commonly known as:  ZYLOPRIM Take 200 mg by mouth daily.   ALPRAZolam 0.5 MG tablet Commonly known as:  XANAX TAKE 1 TABLET BY MOUTH AT BEDTIME AS NEEDED FOR ANXIETY   aspirin EC 81 MG  tablet Take 81 mg by mouth daily.   cetirizine 10 MG tablet Commonly known as:  ZYRTEC Take 1 tablet (10 mg total) by mouth daily.   ferrous sulfate 325 (65 FE) MG tablet TAKE 1 TABLET BY MOUTH TWICE DAILY WITH MEALS TO KEEP BLOOD COUNT UP What changed:    how much to take  how to take this  when to take this  additional instructions   furosemide 40 MG tablet Commonly known as:  LASIX Take 1 tablet (40 mg total) by mouth 2 (two) times daily.   Guaifenesin 1200 MG Tb12 Take 1 tablet (1,200 mg total) by mouth 2 (two) times daily.   latanoprost 0.005 % ophthalmic solution Commonly known as:  XALATAN INT 1 GTT INTO OU  HS   levofloxacin 500 MG tablet Commonly known as:  LEVAQUIN Take 1 tablet (500 mg total) by mouth every other day for 4 days.   metoprolol tartrate 25 MG tablet Commonly known as:  LOPRESSOR TAKE 1 TABLET BY MOUTH EVERY 6 HOURS AS NEEDED FOR HEART FLUTTERING   nitroGLYCERIN 0.4 MG SL tablet Commonly known as:  NITROSTAT Place 1 tablet (0.4 mg total) under the tongue every 5 (five) minutes as needed for chest pain.   Olopatadine HCl 0.2 % Soln INT 1 TO 2 GTS IN OU D PRF ITCHING   omeprazole 20 MG capsule Commonly known as:  PRILOSEC TAKE ONE CAPSULE BY MOUTH DAILY   OVER THE COUNTER MEDICATION Take 2 tablets by mouth every 4 (four) hours as needed (cough). Guaifenesin 400mg  Phenylephrine 10mg    polyethylene glycol packet Commonly known as:  MIRALAX / GLYCOLAX Take 17 g by mouth daily as needed for mild constipation.   potassium chloride 10 MEQ tablet Commonly known as:  K-DUR Take 1 tablet (10 mEq total) by mouth daily. What changed:  how much to take   predniSONE 50 MG tablet Commonly known as:  DELTASONE Take 1 tablet (50 mg total) by mouth daily with breakfast for 3 days. Then take 10 mg dose after completing this course. Start taking on:  03/23/2018 What changed:    medication strength  how much to take  when to take this  additional instructions   senna 8.6 MG Tabs tablet Commonly known as:  SENOKOT Take 1 tablet by mouth daily as needed for mild constipation.   triamcinolone cream 0.1 % Commonly known as:  KENALOG Apply 1 application topically 2 (two) times daily.   Vitamin D3 5000 units Caps Take 5,000 Units by mouth daily.       Discharge Instructions: Please refer to Patient Instructions section of EMR for full details.  Patient was counseled important signs and symptoms that should prompt return to medical care, changes in medications, dietary instructions, activity restrictions, and follow up appointments.   Follow-Up Appointments: Follow-up  Information    Steve Rattler, DO Follow up on 03/26/2018.   Specialty:  Family Medicine Why:  8:30AM Contact information: [place] [address] [phone]           Bonnita Hollow, MD 03/22/2018, 6:15 PM PGY-1, [place]

## 2018-03-22 NOTE — Progress Notes (Signed)
Patient discharged to home. Patient AVS reviewed and signed. Patient capable re-verbalizing medications and follow-up appointments. IV removed. Patient belongings sent with patient. Patient educated to return to the ED in the event of SOB, chest pain or dizziness.   Ryian Lynde, RN 

## 2018-03-22 NOTE — Progress Notes (Signed)
  Echocardiogram 2D Echocardiogram has been performed.  [name] 03/22/2018, 10:53 AM

## 2018-03-22 NOTE — Progress Notes (Signed)
Patient walked in hallway w/o oxygen with NT and O2 sat was 98% RA. Will continue to monitor.   De Nurse, RN

## 2018-03-22 NOTE — Progress Notes (Addendum)
PHARMACY NOTE:  ANTIMICROBIAL RENAL DOSAGE ADJUSTMENT  Current antimicrobial regimen includes a mismatch between antimicrobial dosage and estimated renal function.  As per policy approved by the Pharmacy & Therapeutics and Medical Executive Committees, the antimicrobial dosage will be adjusted accordingly.  Current antimicrobial dosage:  Levaquin 750 mg PO q48h  Indication: COPD exacerbation  Renal Function:  Estimated Creatinine Clearance: 27 mL/min (A) (by C-G formula based on SCr of 1.83 mg/dL (H)). []      On intermittent HD, scheduled: []      On CRRT    Antimicrobial dosage has been changed to:  Levaquin 500 mg PO q48h  Additional comments: No PCN allergy listed, consider changing to Augmentin in elderly patient and setting stop date of 5 days total abx   Thank you for allowing pharmacy to be a part of this patient's care.  Renold Genta, PharmD, BCPS Clinical Pharmacist Clinical phone for 03/22/2018 until 4p is [phone] After 4p, please call Main Rx at [phone] for assistance 03/22/2018 2:07 PM

## 2018-03-26 ENCOUNTER — Encounter: Payer: Self-pay | Admitting: Family Medicine

## 2018-03-26 ENCOUNTER — Ambulatory Visit (INDEPENDENT_AMBULATORY_CARE_PROVIDER_SITE_OTHER): Payer: Medicare Other | Admitting: Family Medicine

## 2018-03-26 VITALS — BP 120/70 | HR 74 | Temp 98.0°F | Wt 165.6 lb

## 2018-03-26 DIAGNOSIS — J449 Chronic obstructive pulmonary disease, unspecified: Secondary | ICD-10-CM

## 2018-03-26 DIAGNOSIS — M353 Polymyalgia rheumatica: Secondary | ICD-10-CM

## 2018-03-26 NOTE — Progress Notes (Signed)
    Subjective:    Patient ID: [name], female    DOB: September 29, 1946, 72 y.o.   MRN: [id number]   CC: hospital follow up SOB  Patient was discharged from hospital Monday after experiencing shortness of breath Saturday and being admitted to [facility]. She has 1 more dose of levaquin and 1 more day of increased steroids before she goes back to her daily 10 mg prednisone. She reports she is feeling much better. Her breathing is good. Denies fevers, chills, productive cough. She also reports her body aches/muscle stiffness has improved significantly. She has not been to see rheumatology again for follow up. She denies other complaints.   Smoking status reviewed- former smoker  Review of Systems- see HPI  Objective:  BP 120/70 (BP Location: Left Arm, Patient Position: Sitting, Cuff Size: Normal)   Pulse 74   Temp 98 F (36.7 C) (Oral)   Wt 165 lb 9.6 oz (75.1 kg)   SpO2 99%   BMI 28.43 kg/m  Vitals and nursing note reviewed  General: well nourished, in no acute distress HEENT: normocephalic, MMM Cardiac: RRR, clear S1 and S2, no murmurs, rubs, or gallops Respiratory: clear to auscultation bilaterally, no increased work of breathing Extremities: no edema or cyanosis. Neuro: alert and oriented, no focal deficits   Assessment & Plan:   1. COPD, moderate ([place]) Doing well since hospital discharge, will complete course of steroids and stress dose steroids today. Follow up as needed.   2. Polymyalgia rheumatica ([place]) Patient feeling better on stress dose steroids (50 mg). She will go back to 10 mg tomorrow. Instructed her to please call rheumatology for follow up appointment as she was having symptoms return on her 10 mg dose. She may need a higher dose of maintenance steroids. She agreed to call and make follow up appointment.    Return if symptoms worsen or fail to improve.   Lucila Maine, DO Family Medicine Resident PGY-2

## 2018-03-26 NOTE — Patient Instructions (Signed)
  I'm glad you're feeling better! Finish the antibiotic (one more pill) and continue your steroids.  Please call your Rheumatologist to schedule an appointment as soon as you can. Phone: [phone]

## 2018-03-27 ENCOUNTER — Other Ambulatory Visit: Payer: Self-pay

## 2018-03-27 ENCOUNTER — Encounter (HOSPITAL_COMMUNITY): Payer: Self-pay | Admitting: Emergency Medicine

## 2018-03-27 ENCOUNTER — Emergency Department (HOSPITAL_COMMUNITY): Payer: Medicare Other

## 2018-03-27 ENCOUNTER — Emergency Department (HOSPITAL_COMMUNITY)
Admission: EM | Admit: 2018-03-27 | Discharge: 2018-03-28 | Disposition: A | Discharge status: Home / Self Care | Payer: Medicare Other | Attending: Emergency Medicine | Admitting: Emergency Medicine

## 2018-03-27 DIAGNOSIS — R1011 Right upper quadrant pain: Secondary | ICD-10-CM | POA: Diagnosis not present

## 2018-03-27 DIAGNOSIS — I5032 Chronic diastolic (congestive) heart failure: Secondary | ICD-10-CM | POA: Diagnosis not present

## 2018-03-27 DIAGNOSIS — I130 Hypertensive heart and chronic kidney disease with heart failure and stage 1 through stage 4 chronic kidney disease, or unspecified chronic kidney disease: Secondary | ICD-10-CM | POA: Insufficient documentation

## 2018-03-27 DIAGNOSIS — Z79899 Other long term (current) drug therapy: Secondary | ICD-10-CM | POA: Diagnosis not present

## 2018-03-27 DIAGNOSIS — Z7982 Long term (current) use of aspirin: Secondary | ICD-10-CM | POA: Diagnosis not present

## 2018-03-27 DIAGNOSIS — N183 Chronic kidney disease, stage 3 (moderate): Secondary | ICD-10-CM | POA: Insufficient documentation

## 2018-03-27 DIAGNOSIS — Z87891 Personal history of nicotine dependence: Secondary | ICD-10-CM | POA: Insufficient documentation

## 2018-03-27 DIAGNOSIS — J45909 Unspecified asthma, uncomplicated: Secondary | ICD-10-CM | POA: Insufficient documentation

## 2018-03-27 DIAGNOSIS — Z950 Presence of cardiac pacemaker: Secondary | ICD-10-CM | POA: Insufficient documentation

## 2018-03-27 DIAGNOSIS — R0602 Shortness of breath: Secondary | ICD-10-CM | POA: Insufficient documentation

## 2018-03-27 DIAGNOSIS — K8020 Calculus of gallbladder without cholecystitis without obstruction: Secondary | ICD-10-CM | POA: Diagnosis not present

## 2018-03-27 DIAGNOSIS — K5900 Constipation, unspecified: Secondary | ICD-10-CM | POA: Diagnosis not present

## 2018-03-27 DIAGNOSIS — R110 Nausea: Secondary | ICD-10-CM | POA: Diagnosis not present

## 2018-03-27 DIAGNOSIS — R1013 Epigastric pain: Secondary | ICD-10-CM | POA: Diagnosis not present

## 2018-03-27 LAB — CBC
HCT: 33.9 % — ABNORMAL LOW (ref 36.0–46.0)
Hemoglobin: 10.6 g/dL — ABNORMAL LOW (ref 12.0–15.0)
MCH: 26.8 pg (ref 26.0–34.0)
MCHC: 31.3 g/dL (ref 30.0–36.0)
MCV: 85.8 fL (ref 78.0–100.0)
Platelets: 344 10*3/uL (ref 150–400)
RBC: 3.95 MIL/uL (ref 3.87–5.11)
RDW: 15.2 % (ref 11.5–15.5)
WBC: 8.1 10*3/uL (ref 4.0–10.5)

## 2018-03-27 LAB — URINALYSIS, ROUTINE W REFLEX MICROSCOPIC
BILIRUBIN URINE: NEGATIVE
Glucose, UA: NEGATIVE mg/dL
HGB URINE DIPSTICK: NEGATIVE
KETONES UR: NEGATIVE mg/dL
Leukocytes, UA: NEGATIVE
NITRITE: NEGATIVE
PH: 7 (ref 5.0–8.0)
Protein, ur: NEGATIVE mg/dL
Specific Gravity, Urine: 1.009 (ref 1.005–1.030)

## 2018-03-27 LAB — COMPREHENSIVE METABOLIC PANEL
ALT: 47 U/L (ref 14–54)
ANION GAP: 12 (ref 5–15)
AST: 86 U/L — ABNORMAL HIGH (ref 15–41)
Albumin: 3.7 g/dL (ref 3.5–5.0)
Alkaline Phosphatase: 52 U/L (ref 38–126)
BUN: 82 mg/dL — ABNORMAL HIGH (ref 6–20)
CHLORIDE: 92 mmol/L — AB (ref 101–111)
CO2: 29 mmol/L (ref 22–32)
CREATININE: 2.15 mg/dL — AB (ref 0.44–1.00)
Calcium: 9.8 mg/dL (ref 8.9–10.3)
GFR, EST AFRICAN AMERICAN: 25 mL/min — AB (ref >60)
GFR, EST NON AFRICAN AMERICAN: 21 mL/min — AB (ref >60)
Glucose, Bld: 228 mg/dL — ABNORMAL HIGH (ref 65–99)
POTASSIUM: 4.6 mmol/L (ref 3.5–5.1)
SODIUM: 133 mmol/L — AB (ref 135–145)
Total Bilirubin: 1 mg/dL (ref 0.3–1.2)
Total Protein: 7 g/dL (ref 6.5–8.1)

## 2018-03-27 LAB — LIPASE, BLOOD: LIPASE: 35 U/L (ref 11–51)

## 2018-03-27 MED ORDER — MORPHINE SULFATE (PF) 4 MG/ML IV SOLN
4.0000 mg | Freq: Once | INTRAVENOUS | Status: AC
  Administered 2018-03-27: 4 mg via INTRAVENOUS
  Filled 2018-03-27: qty 1

## 2018-03-27 MED ORDER — GI COCKTAIL ~~LOC~~: 30.0000 mL | Freq: Once | ORAL | Status: DC

## 2018-03-27 MED ORDER — ONDANSETRON HCL 4 MG/2ML IJ SOLN
4.0000 mg | Freq: Once | INTRAMUSCULAR | Status: AC
  Administered 2018-03-27: 4 mg via INTRAVENOUS
  Filled 2018-03-27: qty 2

## 2018-03-27 MED ORDER — SODIUM CHLORIDE 0.9 % IV BOLUS
500.0000 mL | Freq: Once | INTRAVENOUS | Status: AC
  Administered 2018-03-27: 500 mL via INTRAVENOUS

## 2018-03-27 NOTE — ED Notes (Signed)
Patient presents to ED for assessment of RUQ abdominal pain, very similar to patient's previous gallstone pain.  Patient states nausea, constipation ("I take iron pills"), and cramping.  States when pain is severe she has SOB.  Denies CP

## 2018-03-27 NOTE — ED Notes (Signed)
Patient transported to Ultrasound 

## 2018-03-28 MED ORDER — RANITIDINE HCL 150 MG PO TABS: 150.0000 mg | ORAL_TABLET | Freq: Every day | ORAL | 0 refills | Status: DC

## 2018-03-28 NOTE — Discharge Instructions (Addendum)
1. Medications: zantac, usual home medications 2. Treatment: rest, drink plenty of fluids, advance diet slowly 3. Follow Up: Please followup with your primary doctor in 2 days for discussion of your diagnoses and further evaluation after today's visit; if you do not have a primary care doctor use the resource guide provided to find one; Please return to the ER for persistent vomiting, high fevers or worsening symptoms

## 2018-03-28 NOTE — ED Provider Notes (Signed)
[facility]GENCY DEPARTMENT Provider Note   CSN: [id number] Arrival date & time: 03/27/18  2003     History   Chief Complaint Chief Complaint  Patient presents with  . Abdominal Pain    HPI [name] is a 72 y.o. female with a hx of comp located medical history and known gallstones presents to the Emergency Department complaining of gradual, persistent, progressively worsening upper abdominal pain onset around 630 this morning.  Reports she is had associated severe nausea without vomiting.  No diarrhea.  No fevers or chills, headache, neck pain, chest pain, dysuria, hematuria.  Patient denies known sick contacts.  She does endorse some shortness of breath however she reports this is normal for her.  She was recently admitted to the hospital for COPD exacerbation and given steroids.  She was also discharged home with the steroids.  She reports no dyspnea on exertion or swelling in her legs.  Patient does have a tracheostomy but does not use a ventilator.  She reports some decreased p.o. intake today because of her pain and nausea.  No alleviating factors.  Patient is taking omeprazole and reports she has been compliant with this.  The history is provided by the patient, medical records and a relative. No language interpreter was used.    Past Medical History:  Diagnosis Date  . Anemia   . Angina   . Angioedema    2/2 ACE  . Arteriovenous malformation of stomach   . Arthritis   . Asthma   . AVM (arteriovenous malformation) of colon    small intestine; stomach  . Blood transfusion   . CHF (congestive heart failure) ([place])   . Complete heart block ([place])    s/p PPM 1998  . Complication of anesthesia    Difficult airway; anaphylaxis and swelling with propofol  . DDD (degenerative disc disease)   . Depression   . Diastolic heart failure   . Fatty liver 07/26/10  . GERD (gastroesophageal reflux disease)   . GI bleed   . Headache   . History of  alcohol abuse Stopped Fall 2012  . History of tobacco use Quit Fall 2012  . Hx of cardiovascular stress test    a. Lexiscan Myoview (10/15):  Small inferolateral and apical defect c/w scar and poss soft tissue attenuation, no ischemia, EF 42%  . Hx of colonic polyp 08/13/10   adenomatous  . Hx of colonoscopy   . Hyperlipidemia   . Hyperlipidemia   . Hypertension   . Hypertrophic cardiomyopathy ([place])    dx by Dr [name] 2009  . Iron deficiency anemia   . Myocardial infarction ([place])   . Panic attack   . Panic attacks   . Permanent atrial fibrillation ([place])   . Pneumonia   . Renal failure    baseline creatinine 1.6  . Right arm pain 01/08/2012  . RLS (restless legs syndrome)    Dx 06/2007  . Shortness of breath    sob on exertation  . Sleep apnea     Patient Active Problem List   Diagnosis Date Noted  . Tracheostomy care ([place])   . Shortness of breath 03/20/2018  . Calculus of gallbladder without cholecystitis without obstruction 12/24/2017  . Chronic cough 09/25/2017  . History of colonic polyps   . Polyp of cecum   . Polyp of ascending colon   . Polyp of transverse colon   . Polyp of descending colon   . Polyp of rectum   .  Moderate protein-calorie malnutrition ([place]) 06/02/2017  . Chronic idiopathic constipation 03/19/2017  . Loss of weight 01/26/2017  . 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 Permanent atrial fibrillation ([place])   . Laryngeal stenosis 09/18/2016  . Esophageal dysphagia   . COPD exacerbation ([place])   . Dyspnea on exertion   . Polymyalgia rheumatica ([place]) 04/10/2015  . Low back pain 03/28/2015  . Hypertrophic cardiomyopathy ([place]) 02/15/2015  . Eczema 09/18/2014  . Environmental allergies 09/18/2014  . Pre-diabetes 11/26/2013  . COPD, moderate ([place]) 11/01/2013  . Cough 07/15/2013  . Complete heart block ([place]) 04/03/2011  . Anemia 02/17/2011  . AVM (arteriovenous malformation) 02/13/2011  . PACEMAKER-[facility] 11/28/2010  . CKD (chronic kidney disease) stage  3, GFR 30-59 ml/min ([facility]) 09/24/2010  . Acute on chronic congestive heart failure ([place]) 08/30/2010  . ARTHRITIS 07/24/2010  . Generalized anxiety disorder 07/23/2010  . Hyperlipidemia 02/11/2007  . Essential hypertension 02/11/2007  . APNEA, SLEEP 02/11/2007    Past Surgical History:  Procedure Laterality Date  . CARDIAC CATHETERIZATION    . CARDIAC CATHETERIZATION N/A 08/19/2016   Procedure: Right/Left Heart Cath and Coronary Angiography;  Surgeon: Jolaine Artist, MD;  Location: [facility];  Service: Cardiovascular;  Laterality: N/A;  . COLONOSCOPY WITH PROPOFOL N/A 06/16/2017   Procedure: COLONOSCOPY WITH PROPOFOL;  Surgeon: Mauri Pole, MD;  Location: [facility];  Service: Endoscopy;  Laterality: N/A;  . DIRECT LARYNGOSCOPY N/A 09/18/2016   Procedure: DIRECT LARYNGOSCOPY, BRONCHOSCOPY, REMOVAL OF INTUBATION GRANULOMA;  Surgeon: [name], MD;  Location: [facility];  Service: ENT;  Laterality: N/A;  . DIRECT LARYNGOSCOPY N/A 10/20/2016   Procedure: EXTUBATION AND FLEXIBLE LARYNGOSCOPE;  Surgeon: [name], MD;  Location: [facility];  Service: ENT;  Laterality: N/A;  . DIRECT LARYNGOSCOPY N/A 10/29/2016   Procedure: DIRECT LARYNGOSCOPY;  Surgeon: [name], MD;  Location: [facility] OR;  Service: ENT;  Laterality: N/A;  . ESOPHAGOGASTRODUODENOSCOPY  12/23/2011   Procedure: ESOPHAGOGASTRODUODENOSCOPY (EGD);  Surgeon: [name], MD;  Location: [facility];  Service: Endoscopy;  Laterality: N/A;  . ESOPHAGOGASTRODUODENOSCOPY (EGD) WITH PROPOFOL N/A 06/16/2017   Procedure: ESOPHAGOGASTRODUODENOSCOPY (EGD) WITH PROPOFOL;  Surgeon: Mauri Pole, MD;  Location: [facility];  Service: Endoscopy;  Laterality: N/A;  . EXTUBATION (ENDOTRACHEAL) IN OR N/A 07/21/2016   Procedure: EXTUBATION (ENDOTRACHEAL) IN OR;  Surgeon: [name], MD;  Location: [facility];  Service: ENT;  Laterality: N/A;  . GIVENS CAPSULE STUDY  12/23/2011   Procedure: GIVENS CAPSULE STUDY;  Surgeon: [name],  MD;  Location: [facility];  Service: Endoscopy;  Laterality: N/A;  . MICROLARYNGOSCOPY WITH LASER N/A 10/16/2016   Procedure: MICRODIRECTLARYNGOSCOPY WITH LASER ABLATION AND KENLOG INJECTION;  Surgeon: [name], MD;  Location: [facility];  Service: ENT;  Laterality: N/A;  . PACEMAKER INSERTION  1998   [facility], most recent gen change by [name] 4/12  . TRACHEOSTOMY TUBE PLACEMENT N/A 10/29/2016   Procedure: TRACHEOSTOMY;  Surgeon: [name], MD;  Location: [facility];  Service: ENT;  Laterality: N/A;  . TUBAL LIGATION  04/01/2000     OB History   None      Home Medications    Prior to Admission medications   Medication Sig Start Date End Date Taking? Authorizing Provider  acetaminophen (TYLENOL) 325 MG tablet Take 325-650 mg by mouth every 6 (six) hours as needed for mild pain, fever or headache.    Yes [provider]  albuterol (PROVENTIL HFA;VENTOLIN HFA) 108 (90 Base) MCG/ACT inhaler Inhale 1-2 puffs into  the lungs every 6 (six) hours as needed for wheezing or shortness of breath. 08/11/17  Yes Riccio, Angela C, DO  albuterol (PROVENTIL) (2.5 MG/3ML) 0.083% nebulizer solution Take 3 mLs (2.5 mg total) by nebulization every 6 (six) hours as needed for wheezing or shortness of breath. 06/26/17  Yes Steve Rattler, DO  allopurinol (ZYLOPRIM) 100 MG tablet Take 200 mg by mouth daily. 01/01/18  Yes [provider]  ALPRAZolam Duanne Moron) 0.5 MG tablet TAKE 1 TABLET BY MOUTH AT BEDTIME AS NEEDED FOR ANXIETY 02/25/18  Yes Steve Rattler, DO  aspirin EC 81 MG tablet Take 81 mg by mouth daily.    Yes [provider]  cetirizine (ZYRTEC) 10 MG tablet Take 1 tablet (10 mg total) by mouth daily. 03/17/18  Yes Steve Rattler, DO  Cholecalciferol (VITAMIN D3) 5000 units CAPS Take 5,000 Units by mouth daily.   Yes [provider]  ferrous sulfate 325 (65 FE) MG tablet TAKE 1 TABLET BY MOUTH TWICE DAILY WITH MEALS TO KEEP BLOOD COUNT UP Patient taking differently: Take 325  mg by mouth 2 (two) times daily with a meal. TO KEEP BLOOD COUNT UP 08/26/16  Yes Riccio, Angela C, DO  furosemide (LASIX) 40 MG tablet Take 1 tablet (40 mg total) by mouth 2 (two) times daily. 08/26/17  Yes Riccio, Angela C, DO  Guaifenesin 1200 MG TB12 Take 1 tablet (1,200 mg total) by mouth 2 (two) times daily. 03/17/18  Yes Riccio, Angela C, DO  latanoprost (XALATAN) 0.005 % ophthalmic solution INT 1 GTT INTO OU HS 02/16/18  Yes [provider]  metoprolol tartrate (LOPRESSOR) 25 MG tablet TAKE 1 TABLET BY MOUTH EVERY 6 HOURS AS NEEDED FOR HEART FLUTTERING 04/08/17  Yes Allred, Jeneen Rinks, MD  nitroGLYCERIN (NITROSTAT) 0.4 MG SL tablet Place 1 tablet (0.4 mg total) under the tongue every 5 (five) minutes as needed for chest pain. 01/18/15  Yes Eileen Stanford, PA-C  Olopatadine HCl 0.2 % SOLN INT 1 TO 2 GTS IN OU D PRF ITCHING 02/17/18  Yes [provider]  omeprazole (PRILOSEC) 20 MG capsule TAKE ONE CAPSULE BY MOUTH DAILY 02/02/18  Yes Riccio, Angela C, DO  OVER THE COUNTER MEDICATION Take 2 tablets by mouth every 4 (four) hours as needed (cough). Guaifenesin 400mg  Phenylephrine 10mg    Yes [provider]  polyethylene glycol (MIRALAX / GLYCOLAX) packet Take 17 g by mouth daily as needed for mild constipation.    Yes [provider]  potassium chloride (K-DUR) 10 MEQ tablet Take 1 tablet (10 mEq total) by mouth daily. Patient taking differently: Take 20 mEq by mouth daily.  02/05/18  Yes Steve Rattler, DO  senna (SENOKOT) 8.6 MG TABS tablet Take 1 tablet by mouth daily as needed for mild constipation.   Yes [provider]  triamcinolone cream (KENALOG) 0.1 % Apply 1 application topically 2 (two) times daily. 03/17/18  Yes Riccio, Angela C, DO  ranitidine (ZANTAC) 150 MG tablet Take 1 tablet (150 mg total) by mouth daily. 03/28/18   Cydni Reddoch, Jarrett Soho, PA-C    Family History Family History  Problem Relation Age of Onset  . Hypertension Mother   . Stroke  Mother   . Heart disease Mother   . Aneurysm Mother   . CVA Mother   . Hypertension Father   . Stroke Father   . Heart attack Father   . Alcohol abuse Father   . Hypertension Sister   . Hypertension Brother   .  Colon cancer Brother   . Cancer Brother   . Diabetes Sister   . Diabetes Brother   . Heart attack Brother   . Heart attack Brother   . Heart disease Sister   . Alcohol abuse Brother   . Heart disease Daughter     Social History Social History   Tobacco Use  . Smoking status: Former Smoker    Packs/day: 0.10    Years: 50.00    Pack years: 5.00    Types: Cigarettes    Last attempt to quit: 08/16/2011    Years since quitting: 6.6  . Smokeless tobacco: Never Used  . Tobacco comment: quit 2012  Substance Use Topics  . Alcohol use: No    Alcohol/week: 0.0 oz    Comment: quit 08/16/2011  . Drug use: No     Allergies   Ace inhibitors and Other   Review of Systems Review of Systems  Constitutional: Negative for appetite change, diaphoresis, fatigue, fever and unexpected weight change.  HENT: Negative for mouth sores.   Eyes: Negative for visual disturbance.  Respiratory: Positive for shortness of breath ( baseline). Negative for cough, chest tightness and wheezing.   Cardiovascular: Negative for chest pain.  Gastrointestinal: Positive for abdominal pain, constipation ( chronic) and nausea. Negative for diarrhea and vomiting.  Endocrine: Negative for polydipsia, polyphagia and polyuria.  Genitourinary: Negative for dysuria, frequency, hematuria and urgency.  Musculoskeletal: Negative for back pain and neck stiffness.  Skin: Negative for rash.  Allergic/Immunologic: Negative for immunocompromised state.  Neurological: Negative for syncope, light-headedness and headaches.  Hematological: Does not bruise/bleed easily.  Psychiatric/Behavioral: Negative for sleep disturbance. The patient is not nervous/anxious.      Physical Exam Updated Vital Signs BP 119/74    Pulse 69   Temp 97.8 F (36.6 C)   Resp 17   SpO2 98%   Physical Exam  Constitutional: She appears well-developed and well-nourished. No distress.  Awake, alert, nontoxic appearance  HENT:  Head: Normocephalic and atraumatic.  Mouth/Throat: Oropharynx is clear and moist. No oropharyngeal exudate.  Eyes: Conjunctivae are normal. No scleral icterus.  Neck: Normal range of motion. Neck supple.  Trach in place  Cardiovascular: Normal rate, regular rhythm and intact distal pulses.  Pulmonary/Chest: Effort normal and breath sounds normal. No respiratory distress. She has no wheezes.  Equal chest expansion  Abdominal: Soft. Bowel sounds are normal. She exhibits no mass. There is tenderness in the right upper quadrant and epigastric area. There is no rigidity, no rebound, no guarding and no tenderness at McBurney's point.  Generalized abdominal tenderness worse in the epigastrium and right upper quadrant.  No specific Murphy sign.  Musculoskeletal: Normal range of motion. She exhibits no edema.  Neurological: She is alert.  Speech is clear and goal oriented Moves extremities without ataxia  Skin: Skin is warm and dry. She is not diaphoretic.  Psychiatric: She has a normal mood and affect.  Nursing note and vitals reviewed.    ED Treatments / Results  Labs (all labs ordered are listed, but only abnormal results are displayed) Labs Reviewed  COMPREHENSIVE METABOLIC PANEL - Abnormal; Notable for the following components:      Result Value   Sodium 133 (*)    Chloride 92 (*)    Glucose, Bld 228 (*)    BUN 82 (*)    Creatinine, Ser 2.15 (*)    AST 86 (*)    GFR calc non Af Amer 21 (*)    GFR calc Af  Amer 25 (*)    All other components within normal limits  CBC - Abnormal; Notable for the following components:   Hemoglobin 10.6 (*)    HCT 33.9 (*)    All other components within normal limits  LIPASE, BLOOD  URINALYSIS, ROUTINE W REFLEX MICROSCOPIC    EKG EKG  Interpretation  Date/Time:  Sunday March 28 2018 00:07:45 EDT Ventricular Rate:  70 PR Interval:    QRS Duration: 189 QT Interval:  683 QTC Calculation: 738 R Axis:   -86 Text Interpretation:  Ventricular-paced rhythm No further analysis attempted due to paced rhythm No significant change was found Confirmed by Ezequiel Essex [id number]) on 03/28/2018 12:19:09 AM   Radiology US Abdomen Limited  Result Date: 03/27/2018 CLINICAL DATA:  Right upper quadrant abdominal pain x1 day EXAM: ULTRASOUND ABDOMEN LIMITED RIGHT UPPER QUADRANT COMPARISON:  Abdominal ultrasound dated 12/05/2017 FINDINGS: Gallbladder: Layering small gallstones measuring up to 4 mm. No gallbladder wall thickening or pericholecystic fluid. Common bile duct: Diameter: 5 mm Liver: No focal lesion identified. Within normal limits in parenchymal echogenicity. Portal vein is patent on color Doppler imaging with normal direction of blood flow towards the liver. IMPRESSION: Cholelithiasis, without associated inflammatory changes to suggest acute cholecystitis. Electronically Signed   By: Julian Hy M.D.   On: 03/27/2018 23:28   Dg Abd Acute W/chest  Result Date: 03/27/2018 CLINICAL DATA:  Right upper quadrant abdominal pain, shortness of breath EXAM: DG ABDOMEN ACUTE W/ 1V CHEST COMPARISON:  03/20/2018 FINDINGS: Tracheostomy in satisfactory position. Lungs are essentially clear.  No pleural effusion or pneumothorax. Cardiomegaly. Right subclavian pacemaker. Nonobstructive bowel gas pattern. No evidence of free air under the diaphragm on the upright view. Visualized osseous structures are within normal limits. IMPRESSION: Tracheostomy in satisfactory position. No evidence of acute cardiopulmonary disease. No evidence of small bowel obstruction or free air. Electronically Signed   By: Julian Hy M.D.   On: 03/27/2018 23:36    Procedures Procedures (including critical care time)  Medications Ordered in ED Medications  gi  cocktail (Maalox,Lidocaine,Donnatal) (30 mLs Oral Refused 03/28/18 0002)  ondansetron (ZOFRAN) injection 4 mg (4 mg Intravenous Given 03/27/18 2357)  morphine 4 MG/ML injection 4 mg (4 mg Intravenous Given 03/27/18 2357)  sodium chloride 0.9 % bolus 500 mL (0 mLs Intravenous Stopped 03/28/18 0045)     Initial Impression / Assessment and Plan / ED Course  I have reviewed the triage vital signs and the nursing notes.  Pertinent labs & imaging results that were available during my care of the patient were reviewed by me and considered in my medical decision making (see chart for details).  Clinical Course as of Mar 28 105  Sun Mar 28, 2018  0106 Patient reports significant improvement in her pain after GI cocktail.  On repeat exam, her abdomen remains soft without significant tenderness.  No rebound or guarding.   [HM]    Clinical Course User Index [HM] Cotina Freedman, Jarrett Soho, [place]    Patient presents with epigastric and right upper quadrant abdominal pain with known gallstones.  Slightly elevated AST giving some concern for possible cholecystitis.  No elevation in lipase or ALT.  Less likely to be choledocholithiasis.  No leukocytosis.  Patient is afebrile without tachycardia or hypotension.  No evidence of sepsis.  Urinalysis is without evidence of urinary tract infection.  Of note, patient's creatinine is worse today at 2.15 from 1.83 on March 22, 2018.  Additionally, her BUN is elevated at 82 today up from 51 on  April 8.  I suspect this is prerenal as patient has had little p.o. intake today.  500 mL fluid bolus given as she does have a history of CHF.  Hemoglobin 10.6, anemia appears to be baseline.  Patient does endorse some shortness of breath however this is baseline for her.  She is without hypoxia here in the emergency department.  She is not tachypneic.  She is afebrile.  Chest x-ray without evidence of pneumonia, pulmonary edema or pneumothorax.  No evidence of congestive heart failure or  COPD exacerbation today.  Clear and equal breath sounds.  Ultrasound without evidence of cholecystitis however cholelithiasis is noted.  Acute abdominal films are without evidence of small bowel obstruction or free air.  I personally evaluated these films.  On repeat exam, abdomen remains soft and minimally tender.  She is without rebound or guarding.  Patient may have some component of gastritis as she has been taking prednisone for recent COPD exacerbation.  She is on omeprazole.  I will add Zantac.  Symptoms have improved here.  Will discharge to home with close primary care follow-up.  Discussed reasons to return immediately to the emergency department.  Patient states understanding and is in agreement with the plan.  The patient was discussed with and seen by Dr. Ellender Hose who agrees with the treatment plan.   Final Clinical Impressions(s) / ED Diagnoses   Final diagnoses:  Nausea  Epigastric pain    ED Discharge Orders        Ordered    ranitidine (ZANTAC) 150 MG tablet  Daily     03/28/18 0105       Kiira Brach, Jarrett Soho, PA-C 03/28/18 0106    Duffy Bruce, MD 03/28/18 [phone]

## 2018-04-01 DIAGNOSIS — M2550 Pain in unspecified joint: Secondary | ICD-10-CM | POA: Diagnosis not present

## 2018-04-01 DIAGNOSIS — M1A09X0 Idiopathic chronic gout, multiple sites, without tophus (tophi): Secondary | ICD-10-CM | POA: Diagnosis not present

## 2018-04-01 DIAGNOSIS — M353 Polymyalgia rheumatica: Secondary | ICD-10-CM | POA: Diagnosis not present

## 2018-04-01 DIAGNOSIS — R5382 Chronic fatigue, unspecified: Secondary | ICD-10-CM | POA: Diagnosis not present

## 2018-04-06 ENCOUNTER — Encounter (INDEPENDENT_AMBULATORY_CARE_PROVIDER_SITE_OTHER): Payer: Self-pay | Admitting: Orthopaedic Surgery

## 2018-04-06 ENCOUNTER — Ambulatory Visit (INDEPENDENT_AMBULATORY_CARE_PROVIDER_SITE_OTHER): Payer: Medicare Other | Admitting: Orthopaedic Surgery

## 2018-04-06 VITALS — BP 124/76 | HR 76 | Ht 64.0 in | Wt 165.0 lb

## 2018-04-06 DIAGNOSIS — M87052 Idiopathic aseptic necrosis of left femur: Secondary | ICD-10-CM | POA: Diagnosis not present

## 2018-04-06 NOTE — Progress Notes (Signed)
Office Visit Note   Patient: [name]           Date of Birth: December 19, 1945           MRN: [id number] Visit Date: 04/06/2018              Requested by: Steve Rattler, DO [place], [place] [id number] PCP: Steve Rattler, DO   Assessment & Plan: Visit Diagnoses:  1. Avascular necrosis of bone of left hip (HCC)     Plan: Stable AVN the left hip.  Complex medical history with Multi- organ system problems.  If she has increased hip symptoms she can resume using her walker.  I plan to recheck her in 3 months and will obtain a single standing AP x-ray of both hips on return visit.  Follow-Up Instructions: Return in about 3 months (around 07/06/2018).   Orders:  No orders of the defined types were placed in this encounter.  No orders of the defined types were placed in this encounter.     Procedures: No procedures performed   Clinical Data: No additional findings.   Subjective: Chief Complaint  Patient presents with  . Left Hip - Follow-up    HPI patient returns states her left hip is doing better she is not using her walker and is not been limping.  Previously she had left groin pain and thigh pain.  Previous radiographs showed AVN without femoral head collapse.  Recent visit to the ER with problems with gallstones.  She also was noted to have stage III kidney disease.  She is had acute on chronic heart failure and has some wheezing noted today.  Moderate COPD.  She is on a gradual prednisone wean and brought in today her paper describing the slow gradual wean of prednisone.  She has been on allopurinol 100 mg asked about continuing this but I do not have any records of her hyperuricemia and she can talk with Dr.Michelle Young about continuing the allopurinol.  Currently AVN of her hip is not giving her current symptoms.  Review of Systems 14 point systems updated unchanged other than as mentioned above.  She has active pulmonary renal problems as well  as cardiac problems with heart failure.  AVN of the left hip without collapse.  Previous respiratory arrest during cardiac catheterization with tracheostomy pulmonary failure extended ventilator use.  Patient has pacemaker.  14 point review of systems otherwise negative.   Objective: Vital Signs: BP 124/76 (BP Location: Left Arm, Patient Position: Sitting, Cuff Size: Normal)   Pulse 76   Ht 5\' 4"  (1.626 m)   Wt 165 lb (74.8 kg)   BMI 28.32 kg/m   Physical Exam  Constitutional: She is oriented to person, place, and time. She appears well-developed.  HENT:  Head: Normocephalic.  Right Ear: External ear normal.  Left Ear: External ear normal.  Eyes: Pupils are equal, round, and reactive to light.  Neck: No tracheal deviation present. No thyromegaly present.  Cardiovascular: Normal rate.  Pulmonary/Chest: Effort normal.  Abdominal: Soft.  Neurological: She is alert and oriented to person, place, and time.  Skin: Skin is warm and dry.  Psychiatric: She has a normal mood and affect. Her behavior is normal.    Ortho Exam patient has mild discomfort in internal and external rotation left hip.  She is ambulatory with minimal limp.  She denies pain in the groin.  Full knee extension.  No quad atrophy.  Specialty Comments:  No specialty  comments available.  Imaging: No results found.   PMFS History: Patient Active Problem List   Diagnosis Date Noted  . Tracheostomy care ([place])   . Shortness of breath 03/20/2018  . Calculus of gallbladder without cholecystitis without obstruction 12/24/2017  . Chronic cough 09/25/2017  . History of colonic polyps   . Polyp of cecum   . Polyp of ascending colon   . Polyp of transverse colon   . Polyp of descending colon   . Polyp of rectum   . Moderate protein-calorie malnutrition ([place]) 06/02/2017  . Chronic idiopathic constipation 03/19/2017  . Loss of weight 01/26/2017  . 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 Permanent atrial fibrillation ([place])     . Laryngeal stenosis 09/18/2016  . Esophageal dysphagia   . COPD exacerbation ([place])   . Dyspnea on exertion   . Polymyalgia rheumatica ([place]) 04/10/2015  . Low back pain 03/28/2015  . Hypertrophic cardiomyopathy ([place]) 02/15/2015  . Eczema 09/18/2014  . Environmental allergies 09/18/2014  . Pre-diabetes 11/26/2013  . COPD, moderate ([place]) 11/01/2013  . Cough 07/15/2013  . Complete heart block ([place]) 04/03/2011  . Anemia 02/17/2011  . AVM (arteriovenous malformation) 02/13/2011  . PACEMAKER-[facility] 11/28/2010  . CKD (chronic kidney disease) stage 3, GFR 30-59 ml/min (HCC) 09/24/2010  . Acute on chronic congestive heart failure ([place]) 08/30/2010  . ARTHRITIS 07/24/2010  . Generalized anxiety disorder 07/23/2010  . Hyperlipidemia 02/11/2007  . Essential hypertension 02/11/2007  . APNEA, SLEEP 02/11/2007   Past Medical History:  Diagnosis Date  . Anemia   . Angina   . Angioedema    2/2 ACE  . Arteriovenous malformation of stomach   . Arthritis   . Asthma   . AVM (arteriovenous malformation) of colon    small intestine; stomach  . Blood transfusion   . CHF (congestive heart failure) ([place])   . Complete heart block ([place])    s/p PPM 1998  . Complication of anesthesia    Difficult airway; anaphylaxis and swelling with propofol  . DDD (degenerative disc disease)   . Depression   . Diastolic heart failure   . Fatty liver 07/26/10  . GERD (gastroesophageal reflux disease)   . GI bleed   . Headache   . History of alcohol abuse Stopped Fall 2012  . History of tobacco use Quit Fall 2012  . Hx of cardiovascular stress test    a. Lexiscan Myoview (10/15):  Small inferolateral and apical defect c/w scar and poss soft tissue attenuation, no ischemia, EF 42%  . Hx of colonic polyp 08/13/10   adenomatous  . Hx of colonoscopy   . Hyperlipidemia   . Hyperlipidemia   . Hypertension   . Hypertrophic cardiomyopathy ([place])    dx by Dr [name] 2009  . Iron deficiency anemia   . Myocardial  infarction ([place])   . Panic attack   . Panic attacks   . Permanent atrial fibrillation ([place])   . Pneumonia   . Renal failure    baseline creatinine 1.6  . Right arm pain 01/08/2012  . RLS (restless legs syndrome)    Dx 06/2007  . Shortness of breath    sob on exertation  . Sleep apnea     Family History  Problem Relation Age of Onset  . Hypertension Mother   . Stroke Mother   . Heart disease Mother   . Aneurysm Mother   . CVA Mother   . Hypertension Father   . Stroke Father   . Heart attack Father   .  Alcohol abuse Father   . Hypertension Sister   . Hypertension Brother   . Colon cancer Brother   . Cancer Brother   . Diabetes Sister   . Diabetes Brother   . Heart attack Brother   . Heart attack Brother   . Heart disease Sister   . Alcohol abuse Brother   . Heart disease Daughter     Past Surgical History:  Procedure Laterality Date  . CARDIAC CATHETERIZATION    . CARDIAC CATHETERIZATION N/A 08/19/2016   Procedure: Right/Left Heart Cath and Coronary Angiography;  Surgeon: Jolaine Artist, MD;  Location: [facility];  Service: Cardiovascular;  Laterality: N/A;  . COLONOSCOPY WITH PROPOFOL N/A 06/16/2017   Procedure: COLONOSCOPY WITH PROPOFOL;  Surgeon: Mauri Pole, MD;  Location: [facility];  Service: Endoscopy;  Laterality: N/A;  . DIRECT LARYNGOSCOPY N/A 09/18/2016   Procedure: DIRECT LARYNGOSCOPY, BRONCHOSCOPY, REMOVAL OF INTUBATION GRANULOMA;  Surgeon: [name], MD;  Location[facility] OR;  Service: ENT;  Laterality: N/A;  . DIRECT LARYNGOSCOPY N/A 10/20/2016   Procedure: EXTUBATION AND FLEXIBLE LARYNGOSCOPE;  Surgeon: [name], MD;  Location: [facility];  Service: ENT;  Laterality: N/A;  . DIRECT LARYNGOSCOPY N/A 10/29/2016   Procedure: DIRECT LARYNGOSCOPY;  Surgeon: [name], MD;  Location: [facility] OR;  Service: ENT;  Laterality: N/A;  . ESOPHAGOGASTRODUODENOSCOPY  12/23/2011   Procedure: ESOPHAGOGASTRODUODENOSCOPY (EGD);  Surgeon: [name], MD;   Location: [facility];  Service: Endoscopy;  Laterality: N/A;  . ESOPHAGOGASTRODUODENOSCOPY (EGD) WITH PROPOFOL N/A 06/16/2017   Procedure: ESOPHAGOGASTRODUODENOSCOPY (EGD) WITH PROPOFOL;  Surgeon: Mauri Pole, MD;  Location: [facility];  Service: Endoscopy;  Laterality: N/A;  . EXTUBATION (ENDOTRACHEAL) IN OR N/A 07/21/2016   Procedure: EXTUBATION (ENDOTRACHEAL) IN OR;  Surgeon: [name], MD;  Location: [facility];  Service: ENT;  Laterality: N/A;  . GIVENS CAPSULE STUDY  12/23/2011   Procedure: GIVENS CAPSULE STUDY;  Surgeon: [name], MD;  Location: [facility];  Service: Endoscopy;  Laterality: N/A;  . MICROLARYNGOSCOPY WITH LASER N/A 10/16/2016   Procedure: MICRODIRECTLARYNGOSCOPY WITH LASER ABLATION AND KENLOG INJECTION;  Surgeon: [name], MD;  Location: [facility];  Service: ENT;  Laterality: N/A;  . PACEMAKER INSERTION  1998   [facility], most recent gen change by [name] 4/12  . TRACHEOSTOMY TUBE PLACEMENT N/A 10/29/2016   Procedure: TRACHEOSTOMY;  Surgeon: [name], MD;  Location: [facility];  Service: ENT;  Laterality: N/A;  . TUBAL LIGATION  04/01/2000   Social History   Occupational History  . Occupation: disabled    Fish farm manager: UNEMPLOYED  . Occupation: previously- Development worker, community  Tobacco Use  . Smoking status: Former Smoker    Packs/day: 0.10    Years: 50.00    Pack years: 5.00    Types: Cigarettes    Last attempt to quit: 08/16/2011    Years since quitting: 6.6  . Smokeless tobacco: Never Used  . Tobacco comment: quit 2012  Substance and Sexual Activity  . Alcohol use: No    Alcohol/week: 0.0 oz    Comment: quit 08/16/2011  . Drug use: No  . Sexual activity: Never

## 2018-04-10 DIAGNOSIS — Z930 Tracheostomy status: Secondary | ICD-10-CM | POA: Diagnosis not present

## 2018-04-10 DIAGNOSIS — J449 Chronic obstructive pulmonary disease, unspecified: Secondary | ICD-10-CM | POA: Diagnosis not present

## 2018-04-12 DIAGNOSIS — G4733 Obstructive sleep apnea (adult) (pediatric): Secondary | ICD-10-CM | POA: Diagnosis not present

## 2018-04-12 DIAGNOSIS — Z430 Encounter for attention to tracheostomy: Secondary | ICD-10-CM | POA: Diagnosis not present

## 2018-04-12 DIAGNOSIS — J386 Stenosis of larynx: Secondary | ICD-10-CM | POA: Diagnosis not present

## 2018-04-14 DIAGNOSIS — D631 Anemia in chronic kidney disease: Secondary | ICD-10-CM | POA: Diagnosis not present

## 2018-04-14 DIAGNOSIS — I129 Hypertensive chronic kidney disease with stage 1 through stage 4 chronic kidney disease, or unspecified chronic kidney disease: Secondary | ICD-10-CM | POA: Diagnosis not present

## 2018-04-14 DIAGNOSIS — N184 Chronic kidney disease, stage 4 (severe): Secondary | ICD-10-CM | POA: Diagnosis not present

## 2018-04-15 ENCOUNTER — Telehealth (INDEPENDENT_AMBULATORY_CARE_PROVIDER_SITE_OTHER): Payer: Self-pay | Admitting: Orthopaedic Surgery

## 2018-04-15 NOTE — Telephone Encounter (Signed)
Please advise 

## 2018-04-15 NOTE — Telephone Encounter (Signed)
Patient called to see if Dr. Lorin Mercy could call pain medication into the pharmacy for her, she is in a lot of pain due to her hip. She uses [facility] [address] Patients # [phone]

## 2018-04-15 NOTE — Telephone Encounter (Signed)
I called no answer , left message will try again tomorrow

## 2018-04-16 ENCOUNTER — Other Ambulatory Visit: Payer: Self-pay | Admitting: Family Medicine

## 2018-04-19 ENCOUNTER — Telehealth: Payer: Self-pay | Admitting: *Deleted

## 2018-04-19 ENCOUNTER — Other Ambulatory Visit: Payer: Self-pay | Admitting: Family Medicine

## 2018-04-19 MED ORDER — ALPRAZOLAM 0.5 MG PO TABS: ORAL_TABLET | ORAL | 0 refills | Status: DC

## 2018-04-19 NOTE — Telephone Encounter (Signed)
Patient informed.  Bow Buntyn,CMA  

## 2018-04-19 NOTE — Telephone Encounter (Signed)
-----   Message from Steve Rattler, DO sent at 04/19/2018  1:19 PM EDT ----- Regarding: rx refilled  Printed rx for xanax, it's at front desk.

## 2018-04-19 NOTE — Telephone Encounter (Signed)
Hip worse. She needs to use her walker , not use narcotic meds, see her medical doc and cardiologist /pulm to get in as good of shape as they can get her. Will need total hip for AVN . I called and discussed. She will make appt with medical providers for followup and any needed cardiac tests, pulmonary tests etc.

## 2018-04-19 NOTE — Telephone Encounter (Signed)
noted 

## 2018-05-03 DIAGNOSIS — Z430 Encounter for attention to tracheostomy: Secondary | ICD-10-CM | POA: Diagnosis not present

## 2018-05-03 DIAGNOSIS — J386 Stenosis of larynx: Secondary | ICD-10-CM | POA: Diagnosis not present

## 2018-05-03 DIAGNOSIS — G4733 Obstructive sleep apnea (adult) (pediatric): Secondary | ICD-10-CM | POA: Diagnosis not present

## 2018-05-10 DIAGNOSIS — J449 Chronic obstructive pulmonary disease, unspecified: Secondary | ICD-10-CM | POA: Diagnosis not present

## 2018-05-10 DIAGNOSIS — Z930 Tracheostomy status: Secondary | ICD-10-CM | POA: Diagnosis not present

## 2018-05-11 ENCOUNTER — Other Ambulatory Visit: Payer: Self-pay | Admitting: Family Medicine

## 2018-05-13 NOTE — Telephone Encounter (Signed)
Will leave printed rx up front for patient to pick up.

## 2018-05-13 NOTE — Telephone Encounter (Signed)
Patient informed.  Braniyah Besse,CMA  

## 2018-05-22 ENCOUNTER — Inpatient Hospital Stay (HOSPITAL_COMMUNITY)
Admission: EM | Admit: 2018-05-22 | Discharge: 2018-05-27 | DRG: 191 | Disposition: A | Discharge status: Home Health | Payer: Medicare Other | Attending: Family Medicine | Admitting: Family Medicine

## 2018-05-22 ENCOUNTER — Encounter (HOSPITAL_COMMUNITY): Payer: Self-pay | Admitting: *Deleted

## 2018-05-22 ENCOUNTER — Emergency Department (HOSPITAL_COMMUNITY): Payer: Medicare Other

## 2018-05-22 DIAGNOSIS — K5900 Constipation, unspecified: Secondary | ICD-10-CM | POA: Diagnosis present

## 2018-05-22 DIAGNOSIS — I252 Old myocardial infarction: Secondary | ICD-10-CM | POA: Diagnosis not present

## 2018-05-22 DIAGNOSIS — Z888 Allergy status to other drugs, medicaments and biological substances status: Secondary | ICD-10-CM

## 2018-05-22 DIAGNOSIS — I482 Chronic atrial fibrillation: Secondary | ICD-10-CM | POA: Diagnosis not present

## 2018-05-22 DIAGNOSIS — I248 Other forms of acute ischemic heart disease: Secondary | ICD-10-CM | POA: Diagnosis present

## 2018-05-22 DIAGNOSIS — Z87891 Personal history of nicotine dependence: Secondary | ICD-10-CM | POA: Diagnosis not present

## 2018-05-22 DIAGNOSIS — J811 Chronic pulmonary edema: Secondary | ICD-10-CM | POA: Diagnosis not present

## 2018-05-22 DIAGNOSIS — Z8249 Family history of ischemic heart disease and other diseases of the circulatory system: Secondary | ICD-10-CM

## 2018-05-22 DIAGNOSIS — E785 Hyperlipidemia, unspecified: Secondary | ICD-10-CM | POA: Diagnosis not present

## 2018-05-22 DIAGNOSIS — Z8701 Personal history of pneumonia (recurrent): Secondary | ICD-10-CM

## 2018-05-22 DIAGNOSIS — Z8619 Personal history of other infectious and parasitic diseases: Secondary | ICD-10-CM

## 2018-05-22 DIAGNOSIS — J441 Chronic obstructive pulmonary disease with (acute) exacerbation: Secondary | ICD-10-CM | POA: Diagnosis not present

## 2018-05-22 DIAGNOSIS — I130 Hypertensive heart and chronic kidney disease with heart failure and stage 1 through stage 4 chronic kidney disease, or unspecified chronic kidney disease: Secondary | ICD-10-CM | POA: Diagnosis present

## 2018-05-22 DIAGNOSIS — K219 Gastro-esophageal reflux disease without esophagitis: Secondary | ICD-10-CM | POA: Diagnosis not present

## 2018-05-22 DIAGNOSIS — I340 Nonrheumatic mitral (valve) insufficiency: Secondary | ICD-10-CM | POA: Diagnosis not present

## 2018-05-22 DIAGNOSIS — I361 Nonrheumatic tricuspid (valve) insufficiency: Secondary | ICD-10-CM | POA: Diagnosis not present

## 2018-05-22 DIAGNOSIS — G2581 Restless legs syndrome: Secondary | ICD-10-CM | POA: Diagnosis not present

## 2018-05-22 DIAGNOSIS — M353 Polymyalgia rheumatica: Secondary | ICD-10-CM | POA: Diagnosis present

## 2018-05-22 DIAGNOSIS — K760 Fatty (change of) liver, not elsewhere classified: Secondary | ICD-10-CM | POA: Diagnosis not present

## 2018-05-22 DIAGNOSIS — Z950 Presence of cardiac pacemaker: Secondary | ICD-10-CM | POA: Diagnosis not present

## 2018-05-22 DIAGNOSIS — I5032 Chronic diastolic (congestive) heart failure: Secondary | ICD-10-CM | POA: Diagnosis not present

## 2018-05-22 DIAGNOSIS — R0602 Shortness of breath: Secondary | ICD-10-CM | POA: Diagnosis not present

## 2018-05-22 DIAGNOSIS — F1011 Alcohol abuse, in remission: Secondary | ICD-10-CM | POA: Diagnosis present

## 2018-05-22 DIAGNOSIS — F411 Generalized anxiety disorder: Secondary | ICD-10-CM | POA: Diagnosis present

## 2018-05-22 DIAGNOSIS — N183 Chronic kidney disease, stage 3 (moderate): Secondary | ICD-10-CM | POA: Diagnosis not present

## 2018-05-22 DIAGNOSIS — M109 Gout, unspecified: Secondary | ICD-10-CM | POA: Diagnosis present

## 2018-05-22 DIAGNOSIS — G4730 Sleep apnea, unspecified: Secondary | ICD-10-CM | POA: Diagnosis present

## 2018-05-22 DIAGNOSIS — Z79899 Other long term (current) drug therapy: Secondary | ICD-10-CM

## 2018-05-22 DIAGNOSIS — I422 Other hypertrophic cardiomyopathy: Secondary | ICD-10-CM | POA: Diagnosis present

## 2018-05-22 DIAGNOSIS — Z930 Tracheostomy status: Secondary | ICD-10-CM

## 2018-05-22 DIAGNOSIS — I509 Heart failure, unspecified: Secondary | ICD-10-CM | POA: Diagnosis not present

## 2018-05-22 DIAGNOSIS — D631 Anemia in chronic kidney disease: Secondary | ICD-10-CM | POA: Diagnosis not present

## 2018-05-22 DIAGNOSIS — Z811 Family history of alcohol abuse and dependence: Secondary | ICD-10-CM

## 2018-05-22 DIAGNOSIS — Z86010 Personal history of colonic polyps: Secondary | ICD-10-CM

## 2018-05-22 DIAGNOSIS — F418 Other specified anxiety disorders: Secondary | ICD-10-CM

## 2018-05-22 DIAGNOSIS — F410 Panic disorder [episodic paroxysmal anxiety] without agoraphobia: Secondary | ICD-10-CM | POA: Diagnosis present

## 2018-05-22 DIAGNOSIS — Z7982 Long term (current) use of aspirin: Secondary | ICD-10-CM

## 2018-05-22 DIAGNOSIS — Z833 Family history of diabetes mellitus: Secondary | ICD-10-CM

## 2018-05-22 LAB — COMPREHENSIVE METABOLIC PANEL
ALT: 15 U/L (ref 14–54)
ANION GAP: 14 (ref 5–15)
AST: 26 U/L (ref 15–41)
Albumin: 3.6 g/dL (ref 3.5–5.0)
Alkaline Phosphatase: 48 U/L (ref 38–126)
BILIRUBIN TOTAL: 0.5 mg/dL (ref 0.3–1.2)
BUN: 62 mg/dL — ABNORMAL HIGH (ref 6–20)
CHLORIDE: 96 mmol/L — AB (ref 101–111)
CO2: 25 mmol/L (ref 22–32)
Calcium: 9.3 mg/dL (ref 8.9–10.3)
Creatinine, Ser: 1.96 mg/dL — ABNORMAL HIGH (ref 0.44–1.00)
GFR, EST AFRICAN AMERICAN: 28 mL/min — AB (ref >60)
GFR, EST NON AFRICAN AMERICAN: 24 mL/min — AB (ref >60)
Glucose, Bld: 176 mg/dL — ABNORMAL HIGH (ref 65–99)
POTASSIUM: 4 mmol/L (ref 3.5–5.1)
Sodium: 135 mmol/L (ref 135–145)
TOTAL PROTEIN: 6.9 g/dL (ref 6.5–8.1)

## 2018-05-22 LAB — CBC WITH DIFFERENTIAL/PLATELET
Abs Immature Granulocytes: 0.1 10*3/uL (ref 0.0–0.1)
BASOS PCT: 0 %
Basophils Absolute: 0 10*3/uL (ref 0.0–0.1)
EOS PCT: 0 %
Eosinophils Absolute: 0 10*3/uL (ref 0.0–0.7)
HEMATOCRIT: 29.6 % — AB (ref 36.0–46.0)
Hemoglobin: 8.8 g/dL — ABNORMAL LOW (ref 12.0–15.0)
Immature Granulocytes: 1 %
LYMPHS ABS: 2.1 10*3/uL (ref 0.7–4.0)
Lymphocytes Relative: 22 %
MCH: 26.4 pg (ref 26.0–34.0)
MCHC: 29.7 g/dL — AB (ref 30.0–36.0)
MCV: 88.9 fL (ref 78.0–100.0)
MONOS PCT: 7 %
Monocytes Absolute: 0.7 10*3/uL (ref 0.1–1.0)
NEUTROS PCT: 70 %
Neutro Abs: 6.8 10*3/uL (ref 1.7–7.7)
PLATELETS: 331 10*3/uL (ref 150–400)
RBC: 3.33 MIL/uL — ABNORMAL LOW (ref 3.87–5.11)
RDW: 16 % — AB (ref 11.5–15.5)
WBC: 9.6 10*3/uL (ref 4.0–10.5)

## 2018-05-22 LAB — RETICULOCYTES
RBC.: 3.28 MIL/uL — AB (ref 3.87–5.11)
RETIC CT PCT: 1.9 % (ref 0.4–3.1)
Retic Count, Absolute: 62.3 10*3/uL (ref 19.0–186.0)

## 2018-05-22 LAB — I-STAT TROPONIN, ED: TROPONIN I, POC: 0.16 ng/mL — AB (ref 0.00–0.08)

## 2018-05-22 LAB — BRAIN NATRIURETIC PEPTIDE: B NATRIURETIC PEPTIDE 5: 230.1 pg/mL — AB (ref 0.0–100.0)

## 2018-05-22 MED ORDER — LATANOPROST 0.005 % OP SOLN
1.0000 [drp] | Freq: Every day | OPHTHALMIC | Status: DC
  Administered 2018-05-23 – 2018-05-25 (×3): 1 [drp] via OPHTHALMIC
  Filled 2018-05-22: qty 2.5

## 2018-05-22 MED ORDER — ASPIRIN EC 81 MG PO TBEC
81.0000 mg | DELAYED_RELEASE_TABLET | Freq: Every day | ORAL | Status: DC
  Administered 2018-05-23 – 2018-05-27 (×5): 81 mg via ORAL
  Filled 2018-05-22 (×5): qty 1

## 2018-05-22 MED ORDER — FERROUS SULFATE 325 (65 FE) MG PO TABS
325.0000 mg | ORAL_TABLET | Freq: Two times a day (BID) | ORAL | Status: DC
  Administered 2018-05-23 – 2018-05-27 (×9): 325 mg via ORAL
  Filled 2018-05-22 (×9): qty 1

## 2018-05-22 MED ORDER — HEPARIN SODIUM (PORCINE) 5000 UNIT/ML IJ SOLN
5000.0000 [IU] | Freq: Three times a day (TID) | INTRAMUSCULAR | Status: DC
  Administered 2018-05-22 – 2018-05-27 (×14): 5000 [IU] via SUBCUTANEOUS
  Filled 2018-05-22 (×15): qty 1

## 2018-05-22 MED ORDER — ALLOPURINOL 100 MG PO TABS
200.0000 mg | ORAL_TABLET | Freq: Every day | ORAL | Status: DC
  Administered 2018-05-23 – 2018-05-27 (×5): 200 mg via ORAL
  Filled 2018-05-22 (×5): qty 2

## 2018-05-22 MED ORDER — ALBUTEROL SULFATE (2.5 MG/3ML) 0.083% IN NEBU
5.0000 mg | INHALATION_SOLUTION | Freq: Once | RESPIRATORY_TRACT | Status: AC
  Administered 2018-05-22: 5 mg via RESPIRATORY_TRACT
  Filled 2018-05-22: qty 6

## 2018-05-22 MED ORDER — POLYETHYLENE GLYCOL 3350 17 G PO PACK
17.0000 g | PACK | Freq: Every day | ORAL | Status: DC | PRN
  Administered 2018-05-23 – 2018-05-24 (×2): 17 g via ORAL
  Filled 2018-05-22 (×2): qty 1

## 2018-05-22 MED ORDER — NITROGLYCERIN 0.4 MG SL SUBL: 0.4000 mg | SUBLINGUAL_TABLET | SUBLINGUAL | Status: DC | PRN

## 2018-05-22 MED ORDER — IPRATROPIUM BROMIDE 0.02 % IN SOLN
0.5000 mg | Freq: Once | RESPIRATORY_TRACT | Status: AC
  Administered 2018-05-22: 0.5 mg via RESPIRATORY_TRACT
  Filled 2018-05-22: qty 2.5

## 2018-05-22 MED ORDER — DOXYCYCLINE HYCLATE 100 MG PO TABS
100.0000 mg | ORAL_TABLET | Freq: Two times a day (BID) | ORAL | Status: DC
  Administered 2018-05-23 – 2018-05-27 (×9): 100 mg via ORAL
  Filled 2018-05-22 (×9): qty 1

## 2018-05-22 MED ORDER — ALPRAZOLAM 0.5 MG PO TABS
0.5000 mg | ORAL_TABLET | Freq: Once | ORAL | Status: AC
  Administered 2018-05-22: 0.5 mg via ORAL
  Filled 2018-05-22: qty 1

## 2018-05-22 MED ORDER — PROCHLORPERAZINE MALEATE 10 MG PO TABS: 10.0000 mg | ORAL_TABLET | Freq: Four times a day (QID) | ORAL | Status: DC | PRN

## 2018-05-22 MED ORDER — SENNA 8.6 MG PO TABS
1.0000 | ORAL_TABLET | Freq: Every day | ORAL | Status: DC | PRN
  Administered 2018-05-22 – 2018-05-24 (×3): 8.6 mg via ORAL
  Filled 2018-05-22 (×3): qty 1

## 2018-05-22 MED ORDER — IPRATROPIUM-ALBUTEROL 0.5-2.5 (3) MG/3ML IN SOLN
3.0000 mL | RESPIRATORY_TRACT | Status: DC
  Administered 2018-05-23 – 2018-05-25 (×13): 3 mL via RESPIRATORY_TRACT
  Filled 2018-05-22 (×14): qty 3

## 2018-05-22 MED ORDER — METHYLPREDNISOLONE SODIUM SUCC 125 MG IJ SOLR
125.0000 mg | Freq: Once | INTRAMUSCULAR | Status: AC
  Administered 2018-05-22: 125 mg via INTRAVENOUS
  Filled 2018-05-22: qty 2

## 2018-05-22 MED ORDER — ACETAMINOPHEN 325 MG PO TABS
650.0000 mg | ORAL_TABLET | Freq: Four times a day (QID) | ORAL | Status: DC | PRN
  Administered 2018-05-23 – 2018-05-26 (×6): 650 mg via ORAL
  Filled 2018-05-22 (×7): qty 2

## 2018-05-22 MED ORDER — PANTOPRAZOLE SODIUM 40 MG PO TBEC
40.0000 mg | DELAYED_RELEASE_TABLET | Freq: Every day | ORAL | Status: DC
  Administered 2018-05-23 – 2018-05-27 (×5): 40 mg via ORAL
  Filled 2018-05-22 (×5): qty 1

## 2018-05-22 MED ORDER — ACETAMINOPHEN 650 MG RE SUPP: 650.0000 mg | Freq: Four times a day (QID) | RECTAL | Status: DC | PRN

## 2018-05-22 MED ORDER — PREDNISONE 50 MG PO TABS
50.0000 mg | ORAL_TABLET | Freq: Every day | ORAL | Status: AC
  Administered 2018-05-23 – 2018-05-27 (×5): 50 mg via ORAL
  Filled 2018-05-22 (×5): qty 1

## 2018-05-22 MED ORDER — FUROSEMIDE 10 MG/ML IJ SOLN
40.0000 mg | Freq: Once | INTRAMUSCULAR | Status: AC
  Administered 2018-05-22: 40 mg via INTRAVENOUS
  Filled 2018-05-22: qty 4

## 2018-05-22 MED ORDER — ACETAMINOPHEN 325 MG PO TABS
650.0000 mg | ORAL_TABLET | Freq: Once | ORAL | Status: AC
  Administered 2018-05-22: 650 mg via ORAL
  Filled 2018-05-22: qty 2

## 2018-05-22 NOTE — ED Provider Notes (Signed)
[facility] EMERGENCY DEPARTMENT Provider Note   CSN: [id number] Arrival date & time: 05/22/18  1613     History   Chief Complaint Chief Complaint  Patient presents with  . Shortness of Breath    HPI [name] is a 72 y.o. female hx of trach after surgery, diastolic heart failure, here with SOB. Patient states that for the last week, she has worsening shortness of breath. She noticed more secretions from her trach. She also noticed some wheezing as well. Patient denies any fevers or productive cough. Patient has chronic leg swelling that is unchanged. Patient was admitted for COPD exacerbation about 2 months ago.   The history is provided by the patient.    Past Medical History:  Diagnosis Date  . Anemia   . Angina   . Angioedema    2/2 ACE  . Arteriovenous malformation of stomach   . Arthritis   . Asthma   . AVM (arteriovenous malformation) of colon    small intestine; stomach  . Blood transfusion   . CHF (congestive heart failure) ([place])   . Complete heart block ([place])    s/p PPM 1998  . Complication of anesthesia    Difficult airway; anaphylaxis and swelling with propofol  . DDD (degenerative disc disease)   . Depression   . Diastolic heart failure   . Fatty liver 07/26/10  . GERD (gastroesophageal reflux disease)   . GI bleed   . Headache   . History of alcohol abuse Stopped Fall 2012  . History of tobacco use Quit Fall 2012  . Hx of cardiovascular stress test    a. Lexiscan Myoview (10/15):  Small inferolateral and apical defect c/w scar and poss soft tissue attenuation, no ischemia, EF 42%  . Hx of colonic polyp 08/13/10   adenomatous  . Hx of colonoscopy   . Hyperlipidemia   . Hyperlipidemia   . Hypertension   . Hypertrophic cardiomyopathy ([place])    dx by Dr [name] 2009  . Iron deficiency anemia   . Myocardial infarction ([place])   . Panic attack   . Panic attacks   . Permanent atrial fibrillation ([place])   . Pneumonia   .  Renal failure    baseline creatinine 1.6  . Right arm pain 01/08/2012  . RLS (restless legs syndrome)    Dx 06/2007  . Shortness of breath    sob on exertation  . Sleep apnea     Patient Active Problem List   Diagnosis Date Noted  . Tracheostomy care ([place])   . Shortness of breath 03/20/2018  . Calculus of gallbladder without cholecystitis without obstruction 12/24/2017  . Chronic cough 09/25/2017  . History of colonic polyps   . Polyp of cecum   . Polyp of ascending colon   . Polyp of transverse colon   . Polyp of descending colon   . Polyp of rectum   . Moderate protein-calorie malnutrition ([place]) 06/02/2017  . Chronic idiopathic constipation 03/19/2017  . Loss of weight 01/26/2017  . 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 Permanent atrial fibrillation ([place])   . Laryngeal stenosis 09/18/2016  . Esophageal dysphagia   . COPD exacerbation ([place])   . Dyspnea on exertion   . Polymyalgia rheumatica ([place]) 04/10/2015  . Low back pain 03/28/2015  . Hypertrophic cardiomyopathy ([place]) 02/15/2015  . Eczema 09/18/2014  . Environmental allergies 09/18/2014  . Pre-diabetes 11/26/2013  . COPD, moderate ([place]) 11/01/2013  . Cough 07/15/2013  . Complete heart block ([place]) 04/03/2011  .  Anemia 02/17/2011  . AVM (arteriovenous malformation) 02/13/2011  . PACEMAKER-[facility] 11/28/2010  . CKD (chronic kidney disease) stage 3, GFR 30-59 ml/min ([facility]) 09/24/2010  . Acute on chronic congestive heart failure ([place]) 08/30/2010  . ARTHRITIS 07/24/2010  . Generalized anxiety disorder 07/23/2010  . Hyperlipidemia 02/11/2007  . Essential hypertension 02/11/2007  . APNEA, SLEEP 02/11/2007    Past Surgical History:  Procedure Laterality Date  . CARDIAC CATHETERIZATION    . CARDIAC CATHETERIZATION N/A 08/19/2016   Procedure: Right/Left Heart Cath and Coronary Angiography;  Surgeon: Jolaine Artist, MD;  Location: [facility];  Service: Cardiovascular;  Laterality: N/A;  . COLONOSCOPY WITH PROPOFOL  N/A 06/16/2017   Procedure: COLONOSCOPY WITH PROPOFOL;  Surgeon: Mauri Pole, MD;  Location: [facility];  Service: Endoscopy;  Laterality: N/A;  . DIRECT LARYNGOSCOPY N/A 09/18/2016   Procedure: DIRECT LARYNGOSCOPY, BRONCHOSCOPY, REMOVAL OF INTUBATION GRANULOMA;  Surgeon: [name], MD;  Location[facility] OR;  Service: ENT;  Laterality: N/A;  . DIRECT LARYNGOSCOPY N/A 10/20/2016   Procedure: EXTUBATION AND FLEXIBLE LARYNGOSCOPE;  Surgeon: [name], MD;  Location: [facility];  Service: ENT;  Laterality: N/A;  . DIRECT LARYNGOSCOPY N/A 10/29/2016   Procedure: DIRECT LARYNGOSCOPY;  Surgeon: [name], MD;  Location[facility] OR;  Service: ENT;  Laterality: N/A;  . ESOPHAGOGASTRODUODENOSCOPY  12/23/2011   Procedure: ESOPHAGOGASTRODUODENOSCOPY (EGD);  Surgeon: [name], MD;  Location: [facility];  Service: Endoscopy;  Laterality: N/A;  . ESOPHAGOGASTRODUODENOSCOPY (EGD) WITH PROPOFOL N/A 06/16/2017   Procedure: ESOPHAGOGASTRODUODENOSCOPY (EGD) WITH PROPOFOL;  Surgeon: Mauri Pole, MD;  Location: [facility];  Service: Endoscopy;  Laterality: N/A;  . EXTUBATION (ENDOTRACHEAL) IN OR N/A 07/21/2016   Procedure: EXTUBATION (ENDOTRACHEAL) IN OR;  Surgeon: [name], MD;  Location: [facility];  Service: ENT;  Laterality: N/A;  . GIVENS CAPSULE STUDY  12/23/2011   Procedure: GIVENS CAPSULE STUDY;  Surgeon: [name], MD;  Location: [facility];  Service: Endoscopy;  Laterality: N/A;  . MICROLARYNGOSCOPY WITH LASER N/A 10/16/2016   Procedure: MICRODIRECTLARYNGOSCOPY WITH LASER ABLATION AND KENLOG INJECTION;  Surgeon: [name], MD;  Location: [facility];  Service: ENT;  Laterality: N/A;  . PACEMAKER INSERTION  1998   [facility], most recent gen change by [name] 4/12  . TRACHEOSTOMY TUBE PLACEMENT N/A 10/29/2016   Procedure: TRACHEOSTOMY;  Surgeon: [name], MD;  Location: [facility];  Service: ENT;  Laterality: N/A;  . TUBAL LIGATION  04/01/2000     OB History   None      Home Medications     Prior to Admission medications   Medication Sig Start Date End Date Taking? Authorizing Provider  acetaminophen (TYLENOL) 325 MG tablet Take 325-650 mg by mouth every 6 (six) hours as needed for mild pain, fever or headache.     [provider]  albuterol (PROVENTIL HFA;VENTOLIN HFA) 108 (90 Base) MCG/ACT inhaler Inhale 1-2 puffs into the lungs every 6 (six) hours as needed for wheezing or shortness of breath. 08/11/17   Riccio, Angela C, DO  albuterol (PROVENTIL) (2.5 MG/3ML) 0.083% nebulizer solution Take 3 mLs (2.5 mg total) by nebulization every 6 (six) hours as needed for wheezing or shortness of breath. 06/26/17   Steve Rattler, DO  allopurinol (ZYLOPRIM) 100 MG tablet Take 200 mg by mouth daily. 01/01/18   [provider]  ALPRAZolam Duanne Moron) 0.5 MG tablet TAKE 1 TABLET BY MOUTH AT BEDTIME AS NEEDED FOR ANXIETY 05/13/18   Lucila Maine C, DO  aspirin EC  81 MG tablet Take 81 mg by mouth daily.     [provider]  cetirizine (ZYRTEC) 10 MG tablet Take 1 tablet (10 mg total) by mouth daily. 03/17/18   Steve Rattler, DO  Cholecalciferol (VITAMIN D3) 5000 units CAPS Take 5,000 Units by mouth daily.    [provider]  ferrous sulfate 325 (65 FE) MG tablet TAKE 1 TABLET BY MOUTH TWICE DAILY WITH MEALS TO KEEP BLOOD COUNT UP Patient taking differently: Take 325 mg by mouth 2 (two) times daily with a meal. TO KEEP BLOOD COUNT UP 08/26/16   Lucila Maine C, DO  furosemide (LASIX) 40 MG tablet Take 1 tablet (40 mg total) by mouth 2 (two) times daily. 08/26/17   Steve Rattler, DO  Guaifenesin 1200 MG TB12 Take 1 tablet (1,200 mg total) by mouth 2 (two) times daily. 03/17/18   Steve Rattler, DO  latanoprost (XALATAN) 0.005 % ophthalmic solution INT 1 GTT INTO OU HS 02/16/18   [provider]  metoprolol tartrate (LOPRESSOR) 25 MG tablet TAKE 1 TABLET BY MOUTH EVERY 6 HOURS AS NEEDED FOR HEART FLUTTERING 04/08/17   Allred, Jeneen Rinks, MD  nitroGLYCERIN  (NITROSTAT) 0.4 MG SL tablet Place 1 tablet (0.4 mg total) under the tongue every 5 (five) minutes as needed for chest pain. 01/18/15   Eileen Stanford, PA-C  Olopatadine HCl 0.2 % SOLN INT 1 TO 2 GTS IN OU D PRF ITCHING 02/17/18   [provider]  omeprazole (PRILOSEC) 20 MG capsule TAKE ONE CAPSULE BY MOUTH DAILY 02/02/18   Lucila Maine C, DO  OVER THE COUNTER MEDICATION Take 2 tablets by mouth every 4 (four) hours as needed (cough). Guaifenesin 400mg  Phenylephrine 10mg     [provider]  polyethylene glycol (MIRALAX / GLYCOLAX) packet Take 17 g by mouth daily as needed for mild constipation.     [provider]  potassium chloride (K-DUR) 10 MEQ tablet Take 1 tablet (10 mEq total) by mouth daily. Patient taking differently: Take 20 mEq by mouth daily.  02/05/18   Steve Rattler, DO  ranitidine (ZANTAC) 150 MG tablet Take 1 tablet (150 mg total) by mouth daily. 03/28/18   Muthersbaugh, Jarrett Soho, PA-C  senna (SENOKOT) 8.6 MG TABS tablet Take 1 tablet by mouth daily as needed for mild constipation.    [provider]  triamcinolone cream (KENALOG) 0.1 % Apply 1 application topically 2 (two) times daily. 03/17/18   Steve Rattler, DO    Family History Family History  Problem Relation Age of Onset  . Hypertension Mother   . Stroke Mother   . Heart disease Mother   . Aneurysm Mother   . CVA Mother   . Hypertension Father   . Stroke Father   . Heart attack Father   . Alcohol abuse Father   . Hypertension Sister   . Hypertension Brother   . Colon cancer Brother   . Cancer Brother   . Diabetes Sister   . Diabetes Brother   . Heart attack Brother   . Heart attack Brother   . Heart disease Sister   . Alcohol abuse Brother   . Heart disease Daughter     Social History Social History   Tobacco Use  . Smoking status: Former Smoker    Packs/day: 0.10    Years: 50.00    Pack years: 5.00    Types: Cigarettes    Last attempt to quit: 08/16/2011     Years since quitting:  6.7  . Smokeless tobacco: Never Used  . Tobacco comment: quit 2012  Substance Use Topics  . Alcohol use: No    Alcohol/week: 0.0 oz    Comment: quit 08/16/2011  . Drug use: No     Allergies   Ace inhibitors and Other   Review of Systems Review of Systems  Respiratory: Positive for shortness of breath.   All other systems reviewed and are negative.    Physical Exam Updated Vital Signs BP 135/72   Pulse 70   Temp 98.5 F (36.9 C) (Oral)   Resp (!) 21   SpO2 100%   Physical Exam  Constitutional:  Chronically ill   HENT:  Head: Normocephalic.  Mouth/Throat: Oropharynx is clear and moist.  Eyes: Pupils are equal, round, and reactive to light. EOM are normal.  Neck: Normal range of motion.  Trach in place   Cardiovascular: Normal rate.  Pulmonary/Chest:  Tachypneic, mild wheezing throughout. Mild retractions   Abdominal: Soft. Bowel sounds are normal.  Musculoskeletal:  1+ edema bilaterally   Neurological: She is alert.  Skin: Skin is warm. Capillary refill takes less than 2 seconds.  Psychiatric: She has a normal mood and affect. Her behavior is normal.  Nursing note and vitals reviewed.    ED Treatments / Results  Labs (all labs ordered are listed, but only abnormal results are displayed) Labs Reviewed  CBC WITH DIFFERENTIAL/PLATELET - Abnormal; Notable for the following components:      Result Value   RBC 3.33 (*)    Hemoglobin 8.8 (*)    HCT 29.6 (*)    MCHC 29.7 (*)    RDW 16.0 (*)    All other components within normal limits  COMPREHENSIVE METABOLIC PANEL - Abnormal; Notable for the following components:   Chloride 96 (*)    Glucose, Bld 176 (*)    BUN 62 (*)    Creatinine, Ser 1.96 (*)    GFR calc non Af Amer 24 (*)    GFR calc Af Amer 28 (*)    All other components within normal limits  BRAIN NATRIURETIC PEPTIDE - Abnormal; Notable for the following components:   B Natriuretic Peptide 230.1 (*)    All other components  within normal limits  I-STAT TROPONIN, ED - Abnormal; Notable for the following components:   Troponin i, poc 0.16 (*)    All other components within normal limits    EKG EKG Interpretation  Date/Time:  Saturday May 22 2018 16:21:23 EDT Ventricular Rate:  76 PR Interval:    QRS Duration: 200 QT Interval:  484 QTC Calculation: 544 R Axis:   -10 Text Interpretation:  Ventricular-paced rhythm Abnormal ECG No significant change since last tracing Confirmed by Wandra Arthurs [phone]) on 05/22/2018 5:12:11 PM   Radiology Dg Chest Port 1 View  Result Date: 05/22/2018 CLINICAL DATA:  Progressive worsening dyspnea for 1 week EXAM: PORTABLE CHEST 1 VIEW COMPARISON:  March 27, 2018 FINDINGS: The heart size and mediastinal contours are stable. Heart size is enlarged. Cardiac pacemaker is unchanged. Tracheostomy tube is in good position. There is pulmonary edema. There is no focal pneumonia or pleural effusion. The visualized skeletal structures are unremarkable. IMPRESSION: Congestive heart failure. Electronically Signed   By: Abelardo Diesel M.D.   On: 05/22/2018 18:12    Procedures Procedures (including critical care time)  Medications Ordered in ED Medications  acetaminophen (TYLENOL) tablet 650 mg (has no administration in time range)  ALPRAZolam (XANAX) tablet 0.5 mg (has no administration  in time range)  albuterol (PROVENTIL) (2.5 MG/3ML) 0.083% nebulizer solution 5 mg (5 mg Nebulization Given 05/22/18 1635)  methylPREDNISolone sodium succinate (SOLU-MEDROL) 125 mg/2 mL injection 125 mg (125 mg Intravenous Given 05/22/18 1837)  albuterol (PROVENTIL) (2.5 MG/3ML) 0.083% nebulizer solution 5 mg (5 mg Nebulization Given 05/22/18 1836)  ipratropium (ATROVENT) nebulizer solution 0.5 mg (0.5 mg Nebulization Given 05/22/18 1836)     Initial Impression / Assessment and Plan / ED Course  I have reviewed the triage vital signs and the nursing notes.  Pertinent labs & imaging results that were available  during my care of the patient were reviewed by me and considered in my medical decision making (see chart for details).     Devika Dragovich is a 72 y.o. female here with cough, secretions from trach. Likely COPD exacerbation vs pneumonia vs CHF. Will get labs, BNP, CXR.   7:59 PM Trop 0.16, similar to previous. No chest pain currently. Unclear if she has demand ischemia. CXR showed no pneumonia. BNP baseline. Given nebs, steroids. More wheezing now. Also required some suctioning of the trach. Will admit for COPD exacerbation.    Final Clinical Impressions(s) / ED Diagnoses   Final diagnoses:  None    ED Discharge Orders    None       Drenda Freeze, MD 05/22/18 2000

## 2018-05-22 NOTE — ED Triage Notes (Signed)
Pt to ER for progressively worsening dyspnea over the last week - patient has trach in place, appears distressed on assessment, SpO2 however 100%

## 2018-05-22 NOTE — ED Notes (Signed)
ED Provider at bedside. 

## 2018-05-22 NOTE — H&P (Addendum)
[facility] Admission History and Physical Service Pager: [phone]  Patient name: [name] Medical record number: [id number] Date of birth: September 23, 1946 Age: 72 y.o. Gender: female  Primary Care Provider: Steve Rattler, DO Consultants: None Code Status: Full  Chief Complaint: Shortness of breath, coughing  Assessment and Plan: [name] is a 72 y.o. female presenting with shortness of breath. PMH is significant for COPD, polymyalgia rheumatica, hyperlipidemia, hypertension, GERD, CHF, CKD stage III, sleep apnea, tracheostomy, and GAD.   Shortness of breath Shortness of breath following a previous viral illness. Respiratory rate is mostly normal ranging from 15-22, with O2 sats 96-100% on room air.  Normotensive since arrival with BP range 119/65 - 137-65 with home Metoprolol. Status post Albuterol neb x 3 and Solumedrol x 1 in ED. WBC normal at 9.6 and CXR was notable for pulmonary edema but did not show any focal consolidation or pleural effusion. PNA is considered but given no fever, chills, and no elevated WBC count she does not appear acutely ill with infectious process. BNP elevated to 230.11 but breathing comfortably on room air and this appears elevated at baseline. BNP at last admission was elevated to 290. I-stat Troponin elevated at 0.16 but per chart review she appears to be elevated at baseline. Most likely her troponin is elevated due to demand ischemia. Of note, patient with recent history of pneumonia and completed 14 days of ciprofloxacin without much improvement. Recently hospitalized in 03/2018 due to COPD exacerbation. Wells score 0 making PE less likely and was ruled out 2 months ago during ED visit.  -Admit to Telemetry, attending Dr. Andria [name] -Supplemental O2 as needed - Cardiac monitoring for 48hrs - S/p Solu-Medrol and albuterol x 3 in ED - Duonebs q4 - Doxycycline 100mg  BID (6/8-) for total of 5 days treatment. EKG  shows Vent. Paced rhythm - Prednisone 50mg  daily (6/8-) for total of 5 days of treatment. -Pulse ox with vitals -Vitals per unit routine - Trend Troponin x 3 - if increased work of breathing, or new oxygen requirement, fever, etc. Consider abx coverage for psuedomonas  Tracheostomy.  Placed November 2017 due to chronic upper airway narrowing.  Had a direct laryngoscopy with ablation of subepiglottic granulation tissue on 10/16/16 and failed extubation at that time.   -Trach care ordered  CHF.  Last echo 10/2016 with EF 55-60%, no regional wall motion abnormalities.  At home on 40 mg Lasix BID and Lopressor 25 mg every 6 hours as needed.  Patient appears euvolemic without obvious evidence of fluid overload on exam but CXR was concerning for pulmonary edema.  BNP elevated to 230.11  Per chart review, appears to be elevated at baseline. - One dose of IV Lasix 40mg  tonight, restart home lasix dose of 40mg  BID tomorrow - Consider repeat CXR tomorrow to check for improvement in pulmonary edema - Reassess fluid status in AM  CKD stage III.  Creatinine on admission 1.96.  Baseline creatinine appears to be 1.7-2.0. - Continue to monitor with daily am BMP  Polymyalgia rheumatica.  Stable.  At home on prednisone 10 mg p.o. daily.  Per PCP note patient advised to follow-up with rheumatology to discuss steroid taper and symptoms of body aches. - appears to be on 10mg  prednisone per chart review. Giving 50mg  prednisone for 5 days for COPD exacerbation  Hypertension.  Normotensive on admission with BP 125/71.  At home on Lasix for CHF. - Continue to monitor  Hyperlipidemia.  Last lipid panel 07/2017 with LDL  99, HDL 48, TG 103.  Statin not listed on patient med list.  Gout.  Stable.  At home on Allopurinol 100 mg.  - continue home Allopurinol  Anemia of Chronic Disease.  Hemoglobin 8.9 on admission.  At home on supplemental iron FE sulfate 325 mg. Last iron panel was on 04/09/2017. -Continue p.o.  Iron - daily CBC - Repeat Iron panel and Ferritin  GERD.  Stable.  At home on omeprazole 20 mg daily - Substitute with Protonix 40 mg  GAD.  Stable.  Per chart review, patient taking 0.5 mg Xanax at bedtime prn for anxiety.  She states does not need this tonight, only in certain situations such as when going to the store. - Will hold home benzo for now   FEN/GI: renal diet Prophylaxis: Heparin  Disposition: admit to inpatient, telemetry  History of Present Illness:  [name] is a 72 y.o. female presenting with shortness of breath and cough. Per patient she recently had a "head cold" and this is when the coughing, congestion, runny nose began to start but she could not be sure exactly when. She states sometime after that she began to get short of breath and she had difficulty keeping her trach clear. She states she went to the doctor to get something but that "it didn't help" with the secretions coming from her trach tube. Patient states she is having some mild chest pain and "aches all over my body". She denies fever, chills, or feeling very sick. She states she has some mild nausea and abdominal upset. She believes it is coming from the iron pills she has been taking.   Review Of Systems: Per HPI with the following additions:   Review of Systems  Constitutional: Positive for malaise/fatigue. Negative for chills and fever.  HENT: Positive for congestion. Negative for sore throat.   Eyes: Negative for blurred vision and double vision.  Respiratory: Positive for cough, sputum production and shortness of breath. Negative for wheezing.   Cardiovascular: Positive for chest pain. Negative for palpitations, leg swelling and PND.  Gastrointestinal: Positive for abdominal pain, constipation and nausea. Negative for diarrhea and vomiting.  Genitourinary: Negative for dysuria.  Musculoskeletal: Positive for myalgias. Negative for neck pain.  Neurological: Positive for tingling.  Negative for sensory change, speech change and focal weakness.   Patient Active Problem List   Diagnosis Date Noted  . Tracheostomy care ([place])   . Shortness of breath 03/20/2018  . Calculus of gallbladder without cholecystitis without obstruction 12/24/2017  . Chronic cough 09/25/2017  . History of colonic polyps   . Polyp of cecum   . Polyp of ascending colon   . Polyp of transverse colon   . Polyp of descending colon   . Polyp of rectum   . Moderate protein-calorie malnutrition ([place]) 06/02/2017  . Chronic idiopathic constipation 03/19/2017  . Loss of weight 01/26/2017  . 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 Permanent atrial fibrillation ([place])   . Laryngeal stenosis 09/18/2016  . Esophageal dysphagia   . COPD exacerbation ([place])   . Dyspnea on exertion   . Polymyalgia rheumatica ([place]) 04/10/2015  . Low back pain 03/28/2015  . Hypertrophic cardiomyopathy ([place]) 02/15/2015  . Eczema 09/18/2014  . Environmental allergies 09/18/2014  . Pre-diabetes 11/26/2013  . COPD, moderate ([place]) 11/01/2013  . Cough 07/15/2013  . Complete heart block ([place]) 04/03/2011  . Anemia 02/17/2011  . AVM (arteriovenous malformation) 02/13/2011  . PACEMAKER-[facility] 11/28/2010  . CKD (chronic kidney disease) stage 3, GFR 30-59 ml/min (  [place]) 09/24/2010  . Acute on chronic congestive heart failure ([place]) 08/30/2010  . ARTHRITIS 07/24/2010  . Generalized anxiety disorder 07/23/2010  . Hyperlipidemia 02/11/2007  . Essential hypertension 02/11/2007  . APNEA, SLEEP 02/11/2007    Past Medical History: Past Medical History:  Diagnosis Date  . Anemia   . Angina   . Angioedema    2/2 ACE  . Arteriovenous malformation of stomach   . Arthritis   . Asthma   . AVM (arteriovenous malformation) of colon    small intestine; stomach  . Blood transfusion   . CHF (congestive heart failure) ([place])   . Complete heart block ([place])    s/p PPM 1998  . Complication of anesthesia    Difficult airway; anaphylaxis and  swelling with propofol  . DDD (degenerative disc disease)   . Depression   . Diastolic heart failure   . Fatty liver 07/26/10  . GERD (gastroesophageal reflux disease)   . GI bleed   . Headache   . History of alcohol abuse Stopped Fall 2012  . History of tobacco use Quit Fall 2012  . Hx of cardiovascular stress test    a. Lexiscan Myoview (10/15):  Small inferolateral and apical defect c/w scar and poss soft tissue attenuation, no ischemia, EF 42%  . Hx of colonic polyp 08/13/10   adenomatous  . Hx of colonoscopy   . Hyperlipidemia   . Hyperlipidemia   . Hypertension   . Hypertrophic cardiomyopathy ([place])    dx by Dr [name] 2009  . Iron deficiency anemia   . Myocardial infarction ([place])   . Panic attack   . Panic attacks   . Permanent atrial fibrillation ([place])   . Pneumonia   . Renal failure    baseline creatinine 1.6  . Right arm pain 01/08/2012  . RLS (restless legs syndrome)    Dx 06/2007  . Shortness of breath    sob on exertation  . Sleep apnea     Past Surgical History: Past Surgical History:  Procedure Laterality Date  . CARDIAC CATHETERIZATION    . CARDIAC CATHETERIZATION N/A 08/19/2016   Procedure: Right/Left Heart Cath and Coronary Angiography;  Surgeon: Jolaine Artist, MD;  Location: [facility];  Service: Cardiovascular;  Laterality: N/A;  . COLONOSCOPY WITH PROPOFOL N/A 06/16/2017   Procedure: COLONOSCOPY WITH PROPOFOL;  Surgeon: Mauri Pole, MD;  Location: [facility];  Service: Endoscopy;  Laterality: N/A;  . DIRECT LARYNGOSCOPY N/A 09/18/2016   Procedure: DIRECT LARYNGOSCOPY, BRONCHOSCOPY, REMOVAL OF INTUBATION GRANULOMA;  Surgeon: [name], MD;  Location: [facility] OR;  Service: ENT;  Laterality: N/A;  . DIRECT LARYNGOSCOPY N/A 10/20/2016   Procedure: EXTUBATION AND FLEXIBLE LARYNGOSCOPE;  Surgeon: [name], MD;  Location: [facility];  Service: ENT;  Laterality: N/A;  . DIRECT LARYNGOSCOPY N/A 10/29/2016   Procedure: DIRECT LARYNGOSCOPY;   Surgeon: [name], MD;  Location: [facility] OR;  Service: ENT;  Laterality: N/A;  . ESOPHAGOGASTRODUODENOSCOPY  12/23/2011   Procedure: ESOPHAGOGASTRODUODENOSCOPY (EGD);  Surgeon: [name], MD;  Location: [facility];  Service: Endoscopy;  Laterality: N/A;  . ESOPHAGOGASTRODUODENOSCOPY (EGD) WITH PROPOFOL N/A 06/16/2017   Procedure: ESOPHAGOGASTRODUODENOSCOPY (EGD) WITH PROPOFOL;  Surgeon: Mauri Pole, MD;  Location: [facility];  Service: Endoscopy;  Laterality: N/A;  . EXTUBATION (ENDOTRACHEAL) IN OR N/A 07/21/2016   Procedure: EXTUBATION (ENDOTRACHEAL) IN OR;  Surgeon: [name], MD;  Location: [facility];  Service: ENT;  Laterality: N/A;  . GIVENS CAPSULE STUDY  12/23/2011  Procedure: GIVENS CAPSULE STUDY;  Surgeon: [name], MD;  Location: [facility];  Service: Endoscopy;  Laterality: N/A;  . MICROLARYNGOSCOPY WITH LASER N/A 10/16/2016   Procedure: MICRODIRECTLARYNGOSCOPY WITH LASER ABLATION AND KENLOG INJECTION;  Surgeon: [name], MD;  Location: [facility];  Service: ENT;  Laterality: N/A;  . PACEMAKER INSERTION  1998   st jude, most recent gen change by [name] 4/12  . TRACHEOSTOMY TUBE PLACEMENT N/A 10/29/2016   Procedure: TRACHEOSTOMY;  Surgeon: [name], MD;  Location: [facility] OR;  Service: ENT;  Laterality: N/A;  . TUBAL LIGATION  04/01/2000    Social History: Social History   Tobacco Use  . Smoking status: Former Smoker    Packs/day: 0.10    Years: 50.00    Pack years: 5.00    Types: Cigarettes    Last attempt to quit: 08/16/2011    Years since quitting: 6.7  . Smokeless tobacco: Never Used  . Tobacco comment: quit 2012  Substance Use Topics  . Alcohol use: No    Alcohol/week: 0.0 oz    Comment: quit 08/16/2011  . Drug use: No   Additional social history:   Please also refer to relevant sections of EMR.  Family History: Family History  Problem Relation Age of Onset  . Hypertension Mother   . Stroke Mother   . Heart disease Mother   . Aneurysm Mother   .  CVA Mother   . Hypertension Father   . Stroke Father   . Heart attack Father   . Alcohol abuse Father   . Hypertension Sister   . Hypertension Brother   . Colon cancer Brother   . Cancer Brother   . Diabetes Sister   . Diabetes Brother   . Heart attack Brother   . Heart attack Brother   . Heart disease Sister   . Alcohol abuse Brother   . Heart disease Daughter    Allergies and Medications: Allergies  Allergen Reactions  . Ace Inhibitors Swelling    angiodema  . Other Anaphylaxis and Swelling    Reaction to unspecified anesthesia    No current facility-administered medications on file prior to encounter.    Current Outpatient Medications on File Prior to Encounter  Medication Sig Dispense Refill  . acetaminophen (TYLENOL) 325 MG tablet Take 325-650 mg by mouth every 6 (six) hours as needed for mild pain, fever or headache.     . albuterol (PROVENTIL HFA;VENTOLIN HFA) 108 (90 Base) MCG/ACT inhaler Inhale 1-2 puffs into the lungs every 6 (six) hours as needed for wheezing or shortness of breath. 1 Inhaler 6  . albuterol (PROVENTIL) (2.5 MG/3ML) 0.083% nebulizer solution Take 3 mLs (2.5 mg total) by nebulization every 6 (six) hours as needed for wheezing or shortness of breath. 75 mL 2  . allopurinol (ZYLOPRIM) 100 MG tablet Take 200 mg by mouth daily.  2  . ALPRAZolam (XANAX) 0.5 MG tablet TAKE 1 TABLET BY MOUTH AT BEDTIME AS NEEDED FOR ANXIETY 15 tablet 0  . aspirin EC 81 MG tablet Take 81 mg by mouth daily.     . cetirizine (ZYRTEC) 10 MG tablet Take 1 tablet (10 mg total) by mouth daily. 30 tablet 2  . Cholecalciferol (VITAMIN D3) 5000 units CAPS Take 5,000 Units by mouth daily.    . ferrous sulfate 325 (65 FE) MG tablet TAKE 1 TABLET BY MOUTH TWICE DAILY WITH MEALS TO KEEP BLOOD COUNT UP (Patient taking differently: Take 325 mg by mouth 2 (two) times daily  with a meal. "TO KEEP BLOOD COUNT UP") 60 tablet 11  . furosemide (LASIX) 40 MG tablet Take 1 tablet (40 mg total) by  mouth 2 (two) times daily. 180 tablet 3  . Guaifenesin 1200 MG TB12 Take 1 tablet (1,200 mg total) by mouth 2 (two) times daily. 60 each 0  . latanoprost (XALATAN) 0.005 % ophthalmic solution INT 1 GTT INTO OU HS  3  . metoprolol tartrate (LOPRESSOR) 25 MG tablet TAKE 1 TABLET BY MOUTH EVERY 6 HOURS AS NEEDED FOR HEART FLUTTERING 360 tablet 3  . nitroGLYCERIN (NITROSTAT) 0.4 MG SL tablet Place 1 tablet (0.4 mg total) under the tongue every 5 (five) minutes as needed for chest pain. 25 tablet 1  . Olopatadine HCl 0.2 % SOLN INT 1 TO 2 GTS IN OU D PRF ITCHING  1  . omeprazole (PRILOSEC) 20 MG capsule TAKE ONE CAPSULE BY MOUTH DAILY 30 capsule 0  . OVER THE COUNTER MEDICATION Take 2 tablets by mouth every 4 (four) hours as needed (cough). Guaifenesin 400mg  Phenylephrine 10mg     . polyethylene glycol (MIRALAX / GLYCOLAX) packet Take 17 g by mouth daily as needed for mild constipation.     . potassium chloride (K-DUR) 10 MEQ tablet Take 1 tablet (10 mEq total) by mouth daily. (Patient taking differently: Take 20 mEq by mouth daily. ) 90 tablet 3  . ranitidine (ZANTAC) 150 MG tablet Take 1 tablet (150 mg total) by mouth daily. 15 tablet 0  . senna (SENOKOT) 8.6 MG TABS tablet Take 1 tablet by mouth daily as needed for mild constipation.    . triamcinolone cream (KENALOG) 0.1 % Apply 1 application topically 2 (two) times daily. 30 g 0    Objective: BP 137/65   Pulse 70   Temp 98.5 F (36.9 C) (Oral)   Resp 19   SpO2 98%  Exam: General: A&O x 3; NAD HEENT: Normocephalic, atraumatic, PERRLA, EOMI, non-swollen, non-erythematous turbinates, non-erythematous pharyngeal mucosa, no exudates Neck: trachea midline, no thyroidmegaly, no LAD, trach tube in place CV: RRR, no murmurs, +2 pulses dorsalis pedis bilaterally, no JVD Resp: decreased breath sounds diffusely, no wheezing, rales, or rhonchi, comfortable work of breathing on room air Abd: non-distended, non-tender, soft, +bs in all four  quadrants MSK: Moves in all four extremities Ext: no clubbing, cyanosis, trace bipedal edema Neuro: CN II-XII grossly intact, no focal deficits Skin: warm, dry, intact, no rashes  Labs and Imaging: CBC BMET  Recent Labs  Lab 05/22/18 1733  WBC 9.6  HGB 8.8*  HCT 29.6*  PLT 331   Recent Labs  Lab 05/22/18 1733  NA 135  K 4.0  CL 96*  CO2 25  BUN 62*  CREATININE 1.96*  GLUCOSE 176*  CALCIUM 9.3     BNP: 230.1 I-stat Troponin: 0.13 Troponin I x 3: pending  CXR: FINDINGS: The heart size and mediastinal contours are stable. Heart size is enlarged. Cardiac pacemaker is unchanged. Tracheostomy tube is in good position. There is pulmonary edema. There is no focal pneumonia or pleural effusion. The visualized skeletal structures are unremarkable.  IMPRESSION: Congestive heart failure.  Nuala Alpha, DO 05/22/2018, 9:10 PM PGY-1, [place] Intern pager: [phone], text pages welcome  Upper Level Addendum: I have seen and evaluated this patient along with Dr. Garlan Fillers and reviewed the above note, making necessary revisions in blue.  Harriet [place], [place], PGY-2

## 2018-05-23 ENCOUNTER — Other Ambulatory Visit: Payer: Self-pay

## 2018-05-23 DIAGNOSIS — Z930 Tracheostomy status: Secondary | ICD-10-CM

## 2018-05-23 DIAGNOSIS — F418 Other specified anxiety disorders: Secondary | ICD-10-CM

## 2018-05-23 DIAGNOSIS — J441 Chronic obstructive pulmonary disease with (acute) exacerbation: Principal | ICD-10-CM

## 2018-05-23 LAB — IRON AND TIBC
Iron: 16 ug/dL — ABNORMAL LOW (ref 28–170)
SATURATION RATIOS: 4 % — AB (ref 10.4–31.8)
TIBC: 421 ug/dL (ref 250–450)
UIBC: 405 ug/dL

## 2018-05-23 LAB — BASIC METABOLIC PANEL
ANION GAP: 15 (ref 5–15)
BUN: 58 mg/dL — ABNORMAL HIGH (ref 6–20)
CHLORIDE: 97 mmol/L — AB (ref 101–111)
CO2: 24 mmol/L (ref 22–32)
CREATININE: 2.15 mg/dL — AB (ref 0.44–1.00)
Calcium: 9.7 mg/dL (ref 8.9–10.3)
GFR calc non Af Amer: 21 mL/min — ABNORMAL LOW (ref >60)
GFR, EST AFRICAN AMERICAN: 25 mL/min — AB (ref >60)
GLUCOSE: 423 mg/dL — AB (ref 65–99)
Potassium: 3.9 mmol/L (ref 3.5–5.1)
Sodium: 136 mmol/L (ref 135–145)

## 2018-05-23 LAB — CBC WITH DIFFERENTIAL/PLATELET
Abs Immature Granulocytes: 0.1 10*3/uL (ref 0.0–0.1)
Basophils Absolute: 0 10*3/uL (ref 0.0–0.1)
Basophils Relative: 0 %
EOS ABS: 0 10*3/uL (ref 0.0–0.7)
EOS PCT: 0 %
HEMATOCRIT: 28.3 % — AB (ref 36.0–46.0)
HEMOGLOBIN: 8.4 g/dL — AB (ref 12.0–15.0)
Immature Granulocytes: 1 %
LYMPHS PCT: 4 %
Lymphs Abs: 0.3 10*3/uL — ABNORMAL LOW (ref 0.7–4.0)
MCH: 26.3 pg (ref 26.0–34.0)
MCHC: 29.7 g/dL — AB (ref 30.0–36.0)
MCV: 88.4 fL (ref 78.0–100.0)
MONO ABS: 0.2 10*3/uL (ref 0.1–1.0)
Monocytes Relative: 2 %
Neutro Abs: 7.7 10*3/uL (ref 1.7–7.7)
Neutrophils Relative %: 93 %
Platelets: 319 10*3/uL (ref 150–400)
RBC: 3.2 MIL/uL — AB (ref 3.87–5.11)
RDW: 16.3 % — ABNORMAL HIGH (ref 11.5–15.5)
WBC: 8.2 10*3/uL (ref 4.0–10.5)

## 2018-05-23 LAB — TROPONIN I
TROPONIN I: 0.16 ng/mL — AB (ref <0.03)
TROPONIN I: 0.57 ng/mL — AB (ref <0.03)
Troponin I: 0.43 ng/mL (ref <0.03)
Troponin I: 0.54 ng/mL (ref <0.03)

## 2018-05-23 LAB — PHOSPHORUS: PHOSPHORUS: 3.9 mg/dL (ref 2.5–4.6)

## 2018-05-23 LAB — MAGNESIUM: Magnesium: 2.1 mg/dL (ref 1.7–2.4)

## 2018-05-23 LAB — FERRITIN: FERRITIN: 114 ng/mL (ref 11–307)

## 2018-05-23 MED ORDER — METOPROLOL TARTRATE 25 MG PO TABS: 25.0000 mg | ORAL_TABLET | Freq: Every day | ORAL | Status: DC | PRN

## 2018-05-23 MED ORDER — ALPRAZOLAM 0.5 MG PO TABS
0.5000 mg | ORAL_TABLET | Freq: Every evening | ORAL | Status: DC | PRN
  Administered 2018-05-23 – 2018-05-27 (×4): 0.5 mg via ORAL
  Filled 2018-05-23 (×5): qty 1

## 2018-05-23 NOTE — Progress Notes (Signed)
Pt asking if xanax if ordered for bedtime, I advised it was not and paged md a pt requested

## 2018-05-23 NOTE — Progress Notes (Signed)
Educated pt about ATC and the importance of humidification.  Pt declined the use of ATC.  RT will continue to monitor pt.

## 2018-05-23 NOTE — Progress Notes (Signed)
Troponin 0.57.paged to Winfrey PA on call

## 2018-05-23 NOTE — Progress Notes (Signed)
Central monitor tech called me to inform that pt was having SVT up to 140's HR, went in to the pt.'s room and pt was rubbing her chest, pt stated " I'm having a heart flutter", I have this feeling even at home, I just rubbed my chest and it will go away. I take Xanax and baby Aspirin and it will help me." VS and EKG done.  MD paged. Awaiting for call back. Will continue to monitor pt.   05/23/18 0101  Vitals  Temp 98.5 F (36.9 C)  Temp Source Oral  BP (!) 126/53  MAP (mmHg) 74  BP Method Automatic  Patient Position (if appropriate) Lying  Pulse Rate 70  Pulse Rate Source Monitor  Resp 20  Oxygen Therapy  SpO2 98 %  O2 Device Room Air

## 2018-05-23 NOTE — Progress Notes (Signed)
CRITICAL VALUE ALERT  Critical Value:  Troponin 0.43  Date & Time Notied:  05/23/18 0700  Provider Notified: Garlan Fillers  Orders Received/Actions taken: protocol,no new orders given

## 2018-05-23 NOTE — Progress Notes (Signed)
Family Medicine Teaching Service Daily Progress Note Intern Pager: [phone]  Patient name: [name] Medical record number: [id number] Date of birth: 1946-05-23 Age: 72 y.o. Gender: female  Primary Care Provider: Steve Rattler, DO Consultants: None Code Status: Full  Pt Overview and Major Events to Date:  [name] is a 72 y.o. female presenting with shortness of breath. PMH is significant for COPD, polymyalgia rheumatica, hyperlipidemia, hypertension, GERD, CHF, CKD stage III, sleep apnea, tracheostomy, and GAD.  Assessment and Plan:  Shortness of breath now with chest pain Acute on Chronic. Slightly improved this am but patient is wheezing on exam. Most likely a COPD exacerbation following a viral illness causing shortness of breath. However, BNP is elevated and CXR was significant for pulmonary edema so CHF could be playing a role. Vitals remain wnl and WBC normal at 9.6>8.2. Troponin appears to be elevated at baseline at 0.16 however, due to chest pain this am got another and it was elevated at 0.43. I suspect demand ischemia due to CHF but I will trend Troponin x 3. Of note, patient with recent history of pneumonia and completed 14 days of ciprofloxacinwithout much improvement. Recently hospitalized in 03/2018 due to COPD exacerbation.  - Supplemental O2 as needed - Cardiac monitoring for 48hrs - F/u Troponin; consult Cardiology if they continue to increase - F/u repeat EKG - Duonebs q4 - Doxycycline 100mg  BID (6/8-) for total of 5 days treatment. EKG shows Vent. Paced rhythm - Prednisone 50mg  daily (6/8-) for total of 5 days of treatment. - if increased work of breathing, or new oxygen requirement, fever, etc. Consider abx coverage for psuedomonas - daily weights, strict I/O's  Tracheostomy.PlacedNovember 2017due to chronic upper airway narrowing.Had a direct laryngoscopy with ablation of subepiglottic granulation tissue on 10/16/16 and failed  extubation at that time. -Trach care ordered  CHF.Last echo 10/2016 with EF 55-60%, no regional wall motion abnormalities. Trace edema in BLE. At home on 40 mg Lasix BIDand Lopressor 25 mg every 6 hours as needed.Patient appears euvolemic without obvious evidence of fluid overload on exam but CXR was concerning for pulmonary edema.BNP elevated to 230.11 Per chart review, appears to be elevated at baseline. - S/p one dose of IV Lasix 40mg  on 6/8; holding home Lasix for now - Consider repeat CXR tomorrow to check for improvement in pulmonary edema  CKD stage III.Creatinine on admission 1.96 now 2.15. Baseline creatinine appears to be 1.7-2.0. - Hold home Lasix for today - Continue to monitor with daily am BMP  Polymyalgia rheumatica.Stable. At home on prednisone 10 mg p.o. daily. Per PCP note patient advised to follow-up with rheumatology to discuss steroid taperand symptoms of body aches. - appears to be on 10mg  prednisone per chart review. Giving 50mg  prednisone for 5 days for COPD exacerbation  Hypertension.Largely normotensive with some mild elevation since admission with SBP 119-143 and DBP 54-71. At home on Lasix for CHF. - Continue to monitor - holding home Lasix  Hyperlipidemia.Last lipid panel 07/2017 with LDL 99, HDL 48, TG 103.Statin not listed on patient med list. - consider repeat lipid panel  Gout. Stable. At home on Allopurinol 100 mg.  - continue home Allopurinol  Anemia of Chronic Disease. Chronic. Stable. Hemoglobin 8.9 on admission, on 8.4 this am (6/9). At home on supplemental iron FE sulfate 325 mg. Last iron panel was on 04/09/2017. -Continue p.o. Iron - daily CBC - Repeat Iron panel and Ferritin  GERD. Stable. At home on omeprazole 20 mg daily - Substitute with Protonix  40 mg  GAD.Stable. Per chart review, patient taking 0.5 mg Xanax at bedtime prnfor anxiety.She states does not need this tonight, only in certain  situationssuch aswhen going to the store. - Will holdhome benzo for now  FEN/GI: renal diet PPx: Heparin  Disposition: home  Subjective:  This am patient states she feels better concerning her breathing. She is no longer complaining of nausea or abdominal discomfort. She states she did have some chest pain overnight and it "feels like there is something in my chest". She denies any pressure on her chest or radiating pain.  Objective: Temp:  [98 F (36.7 C)-98.5 F (36.9 C)] 98.5 F (36.9 C) (06/09 0428) Pulse Rate:  [67-77] 70 (06/09 0446) Resp:  [15-22] 20 (06/09 0446) BP: (119-143)/(53-91) 139/54 (06/09 0428) SpO2:  [96 %-100 %] 97 % (06/09 0446) Weight:  [170 lb 9.6 oz (77.4 kg)-171 lb 8 oz (77.8 kg)] 170 lb 9.6 oz (77.4 kg) (06/09 0428) Physical Exam: Gen: Alert and Oriented x 3, NAD HEENT: Normocephalic, atraumatic, PERRLA, EOMI CV: RRR, no murmurs, normal S1, S2 split, +2 pulses dorsalis pedis bilaterally Resp: CTAB, diffuse wheezing bilaterally, rales, or rhonchi, comfortable work of breathing Abd: non-distended, non-tender, soft, +bs in all four quadrants MSK: Moves all four extremities Ext: no clubbing, cyanosis, trace edema bilaterally Skin: warm, dry, intact, no rashes  Laboratory: Recent Labs  Lab 05/22/18 1733 05/23/18 0521  WBC 9.6 8.2  HGB 8.8* 8.4*  HCT 29.6* 28.3*  PLT 331 319   Recent Labs  Lab 05/22/18 1733 05/23/18 0521  NA 135 136  K 4.0 3.9  CL 96* 97*  CO2 25 24  BUN 62* 58*  CREATININE 1.96* 2.15*  CALCIUM 9.3 9.7  PROT 6.9  --   BILITOT 0.5  --   ALKPHOS 48  --   ALT 15  --   AST 26  --   GLUCOSE 176* 423*    BNP: 230.1 I-stat Troponin: 0.13 Troponin I x 3: 0.16 > 0.43 >  EKG: pending  Imaging/Diagnostic Tests: CXR: FINDINGS: The heart size and mediastinal contours are stable. Heart size is enlarged. Cardiac pacemaker is unchanged. Tracheostomy tube is in good position. There is pulmonary edema. There is no focal  pneumonia or pleural effusion. The visualized skeletal structures are unremarkable.  IMPRESSION: Congestive heart failure.   Nuala Alpha, DO 05/23/2018, 7:06 AM PGY-1, [place] Intern pager: [phone], text pages welcome

## 2018-05-24 ENCOUNTER — Inpatient Hospital Stay (HOSPITAL_COMMUNITY): Payer: Medicare Other

## 2018-05-24 DIAGNOSIS — I361 Nonrheumatic tricuspid (valve) insufficiency: Secondary | ICD-10-CM

## 2018-05-24 LAB — CBC
HCT: 28.8 % — ABNORMAL LOW (ref 36.0–46.0)
Hemoglobin: 8.6 g/dL — ABNORMAL LOW (ref 12.0–15.0)
MCH: 26.3 pg (ref 26.0–34.0)
MCHC: 29.9 g/dL — AB (ref 30.0–36.0)
MCV: 88.1 fL (ref 78.0–100.0)
PLATELETS: 320 10*3/uL (ref 150–400)
RBC: 3.27 MIL/uL — ABNORMAL LOW (ref 3.87–5.11)
RDW: 16.1 % — ABNORMAL HIGH (ref 11.5–15.5)
WBC: 15.1 10*3/uL — ABNORMAL HIGH (ref 4.0–10.5)

## 2018-05-24 LAB — LIPID PANEL
CHOL/HDL RATIO: 3.2 ratio
Cholesterol: 222 mg/dL — ABNORMAL HIGH (ref 0–200)
HDL: 69 mg/dL (ref >40)
LDL CALC: 106 mg/dL — AB (ref 0–99)
Triglycerides: 235 mg/dL — ABNORMAL HIGH (ref <150)
VLDL: 47 mg/dL — AB (ref 0–40)

## 2018-05-24 LAB — TROPONIN I
TROPONIN I: 0.27 ng/mL — AB (ref <0.03)
Troponin I: 0.52 ng/mL (ref <0.03)
Troponin I: 0.43 ng/mL (ref <0.03)
Troponin I: 0.38 ng/mL (ref <0.03)
Troponin I: 0.35 ng/mL (ref <0.03)

## 2018-05-24 LAB — BASIC METABOLIC PANEL
Anion gap: 8 (ref 5–15)
BUN: 52 mg/dL — AB (ref 6–20)
CALCIUM: 9.7 mg/dL (ref 8.9–10.3)
CO2: 28 mmol/L (ref 22–32)
CREATININE: 1.64 mg/dL — AB (ref 0.44–1.00)
Chloride: 102 mmol/L (ref 101–111)
GFR calc Af Amer: 34 mL/min — ABNORMAL LOW (ref >60)
GFR calc non Af Amer: 30 mL/min — ABNORMAL LOW (ref >60)
GLUCOSE: 131 mg/dL — AB (ref 65–99)
Potassium: 4.9 mmol/L (ref 3.5–5.1)
Sodium: 138 mmol/L (ref 135–145)

## 2018-05-24 LAB — ECHOCARDIOGRAM COMPLETE
Height: 64 in
WEIGHTICAEL: 2731.2 [oz_av]

## 2018-05-24 MED ORDER — SIMETHICONE 80 MG PO CHEW
80.0000 mg | CHEWABLE_TABLET | Freq: Once | ORAL | Status: AC
  Administered 2018-05-24: 80 mg via ORAL
  Filled 2018-05-24: qty 1

## 2018-05-24 MED ORDER — ROSUVASTATIN CALCIUM 20 MG PO TABS
20.0000 mg | ORAL_TABLET | Freq: Every day | ORAL | Status: DC
  Administered 2018-05-24 – 2018-05-27 (×4): 20 mg via ORAL
  Filled 2018-05-24 (×4): qty 1

## 2018-05-24 MED ORDER — FUROSEMIDE 40 MG PO TABS
40.0000 mg | ORAL_TABLET | Freq: Two times a day (BID) | ORAL | Status: DC
  Administered 2018-05-24 – 2018-05-27 (×6): 40 mg via ORAL
  Filled 2018-05-24 (×6): qty 1

## 2018-05-24 NOTE — Progress Notes (Signed)
Pt c/o sharp cramping pains in belly, paged md

## 2018-05-24 NOTE — Care Management Note (Signed)
Case Management Note  Patient Details  Name: [name] MRN: [id number] Date of Birth: December 30, 1945  Subjective/Objective:    COPD               Action/Plan: Patient lives at home with her son and daughter; Primary Care Provider: Steve Rattler, DO; has private insurance with [facility] with prescription drug coverage; pharmacy of choice is [facility]; she has a chronic trach. CM observed patient performing trach care/ not using sterile technique; consult placed for RT for ongoing trach care education; DME - home oxygen through [facility], nebulizer machine, cane, rollater (rolling walker with seat). Patient is agreeable to have [place] services at discharge; [place] choice offered, she chose [place]; Dan with [facility] called for arrangements; CM will continue to follow for progression of care.  Expected Discharge Date:    possibly 05/28/2018              Expected Discharge Plan:  [place]  In-House Referral:   [facility] program for COPD  Discharge planning Services  CM Consult  Choice offered to:  Patient  HH Arranged:  RN [facility] Agency:  [place]  Status of Service:  In process, will continue to follow  Sherrilyn Rist [phone] 05/24/2018, 11:24 AM

## 2018-05-24 NOTE — Progress Notes (Signed)
  Echocardiogram 2D Echocardiogram has been performed.  [name] 05/24/2018, 9:32 AM

## 2018-05-24 NOTE — Progress Notes (Signed)
Family Medicine Teaching Service Daily Progress Note Intern Pager: [phone]  Patient name: [name] Medical record number: [id number] Date of birth: November 26, 1946 Age: 72 y.o. Gender: female  Primary Care Provider: Steve Rattler, DO Consultants: None Code Status: Full  Pt Overview and Major Events to Date:  [name] is a 72 y.o. female presenting with shortness of breath. PMH is significant for COPD, polymyalgia rheumatica, hyperlipidemia, hypertension, GERD, CHF, CKD stage III, sleep apnea, tracheostomy, and GAD.   Assessment and Plan:  Shortness of breath now with chest pain Objectively has improved significantly. On room air, without any wheezing. Subjectively she does not feel at her baseline and feels like she is still having difficulty taking breaths. No signs of volume overload. BNP is elevated but at her baseline, no edema. Troponins elevated but downtrending. Initially 0.57 but down to 0.38. Troponins chronically with mild elevation. Echo performed which showed LVEF 65-70%. Patient without any controller medication. Could benefit from [place] prior to discharge. Her trach makes this more difficult but should be able to still use. - Supplemental O2 as needed - Cardiac monitoring for 24 more hours - trend troponins - Duonebs q4 prn - Doxycycline 100mg  BID (6/8-) for total of 5 days treatment - Prednisone 50mg  daily (6/8-) for total of 5 days of treatment. - daily weights, strict I/O's  Abdominal pain 2/2 likely constipation Has not had bowel movements for a couple of weeks. Will place on bowel regimen. - miralax 17g daily - senna 8.6mg  prn  Tracheostomy PlacedNovember 2017due to chronic upper airway narrowing.Had a direct laryngoscopy with ablation of subepiglottic granulation tissue on 10/16/16 and failed extubation at that time. Lurline Idol care ordered  HFpEF CHF Echo 65-70% which is improved from 55-60% on 10/2016. None-trace edema appreciated  BLE. Will restart home lasix. Lipid panel elevated with LDL 106. Will restart home crestor. Chronically elevated BNP. - continue home lasix - conitnue home crestor  CKD stage IIIB Creatinine improved from 2.15 to 1.64. Baseline creatinine appears to be 1.7-2.0. - restarting home Lasix  - Continue to monitor with daily am BMP  Polymyalgia rheumatica.Stable. At home on prednisone 10 mg p.o. daily. Per PCP note patient advised to follow-up with rheumatology to discuss steroid taperand symptoms of body aches. - appears to be on 10mg  prednisone per chart review. Giving 50mg  prednisone for 5 days for COPD exacerbation  Hypertension.Largely normotensive with some mild elevation since admission with SBP 119-143 and DBP 54-71. At home on Lasix for CHF. - Continue to monitor - holding home Lasix  Hyperlipidemia Total cholesterol and LDL mildly elevated. Restart home crestor 20mg , could consider increase as outpatient.  Gout. Stable. At home on Allopurinol 100 mg.  - continue home Allopurinol  Anemia of Chronic Disease. Chronic. Stable. Hemoglobin 8.9 on admission, on 8.4 this am (6/9). At home on supplemental iron FE sulfate 325 mg. Last iron panel was on 04/09/2017. -Continue p.o. Iron - daily CBC - Repeat Iron panel and Ferritin  GERD. Stable. At home on omeprazole 20 mg daily - Substitute with Protonix 40 mg  GAD.Stable. Per chart review, patient taking 0.5 mg Xanax at bedtime prnfor anxiety.She states does not need this tonight, only in certain situationssuch aswhen going to the store. - Will holdhome benzo for now  FEN/GI: renal diet PPx: Heparin  Disposition: home  Subjective:  Feeling a little better this morning but not back to baseline. Complaining of some abdominal pain.  Objective: Temp:  [98 F (36.7 C)-98.4 F (36.9 C)] 98 F (  36.7 C) (06/10 0556) Pulse Rate:  [69-77] 70 (06/10 1142) Resp:  [18-20] 18 (06/10 1142) BP: (131-136)/(70-94)  136/94 (06/10 1142) SpO2:  [97 %-100 %] 100 % (06/10 1142) Weight:  [170 lb 11.2 oz (77.4 kg)] 170 lb 11.2 oz (77.4 kg) (06/10 0556) Physical Exam: Gen: Alert and Oriented x 3, NAD HEENT: Normocephalic, atraumatic, PERRLA, EOMI CV: Regular Rate Rhythm, no m/r/g Resp: Clear to Ausculation Bilaterally, diffuse wheezing bilaterally, rales, or rhonchi, comfortable work of breathing Abd: non-distended, non-tender, soft, +bs in all four quadrants MSK: Moves all four extremities Ext: no clubbing, cyanosis, trace edema bilaterally Skin: warm, dry, intact, no rashes  Laboratory: Recent Labs  Lab 05/22/18 1733 05/23/18 0521 05/24/18 0544  WBC 9.6 8.2 15.1*  HGB 8.8* 8.4* 8.6*  HCT 29.6* 28.3* 28.8*  PLT 331 319 320   Recent Labs  Lab 05/22/18 1733 05/23/18 0521 05/24/18 0544  NA 135 136 138  K 4.0 3.9 4.9  CL 96* 97* 102  CO2 25 24 28   BUN 62* 58* 52*  CREATININE 1.96* 2.15* 1.64*  CALCIUM 9.3 9.7 9.7  PROT 6.9  --   --   BILITOT 0.5  --   --   ALKPHOS 48  --   --   ALT 15  --   --   AST 26  --   --   GLUCOSE 176* 423* 131*    BNP: 230.1 I-stat Troponin: 0.13 Troponin I x 3: 0.16 > 0.43 >  EKG: pending  Imaging/Diagnostic Tests: CXR: FINDINGS: The heart size and mediastinal contours are stable. Heart size is enlarged. Cardiac pacemaker is unchanged. Tracheostomy tube is in good position. There is pulmonary edema. There is no focal pneumonia or pleural effusion. The visualized skeletal structures are unremarkable.  IMPRESSION: Congestive heart failure.   Guadalupe Dawn, MD 05/24/2018, 2:49 PM PGY-1, [place] Intern pager: [phone], text pages welcome

## 2018-05-25 LAB — COMPREHENSIVE METABOLIC PANEL
ALT: 12 U/L — ABNORMAL LOW (ref 14–54)
ANION GAP: 9 (ref 5–15)
AST: 19 U/L (ref 15–41)
Albumin: 3 g/dL — ABNORMAL LOW (ref 3.5–5.0)
Alkaline Phosphatase: 49 U/L (ref 38–126)
BILIRUBIN TOTAL: 0.4 mg/dL (ref 0.3–1.2)
BUN: 51 mg/dL — ABNORMAL HIGH (ref 6–20)
CHLORIDE: 102 mmol/L (ref 101–111)
CO2: 28 mmol/L (ref 22–32)
Calcium: 9.8 mg/dL (ref 8.9–10.3)
Creatinine, Ser: 1.86 mg/dL — ABNORMAL HIGH (ref 0.44–1.00)
GFR, EST AFRICAN AMERICAN: 30 mL/min — AB (ref >60)
GFR, EST NON AFRICAN AMERICAN: 26 mL/min — AB (ref >60)
Glucose, Bld: 145 mg/dL — ABNORMAL HIGH (ref 65–99)
POTASSIUM: 4.7 mmol/L (ref 3.5–5.1)
Sodium: 139 mmol/L (ref 135–145)
TOTAL PROTEIN: 6.2 g/dL — AB (ref 6.5–8.1)

## 2018-05-25 LAB — CBC WITH DIFFERENTIAL/PLATELET
Abs Immature Granulocytes: 0.1 10*3/uL (ref 0.0–0.1)
BASOS PCT: 0 %
Basophils Absolute: 0 10*3/uL (ref 0.0–0.1)
EOS ABS: 0 10*3/uL (ref 0.0–0.7)
EOS PCT: 0 %
HCT: 29.4 % — ABNORMAL LOW (ref 36.0–46.0)
Hemoglobin: 8.7 g/dL — ABNORMAL LOW (ref 12.0–15.0)
IMMATURE GRANULOCYTES: 1 %
Lymphocytes Relative: 6 %
Lymphs Abs: 0.7 10*3/uL (ref 0.7–4.0)
MCH: 26.4 pg (ref 26.0–34.0)
MCHC: 29.6 g/dL — AB (ref 30.0–36.0)
MCV: 89.4 fL (ref 78.0–100.0)
MONOS PCT: 5 %
Monocytes Absolute: 0.6 10*3/uL (ref 0.1–1.0)
NEUTROS PCT: 88 %
Neutro Abs: 10.8 10*3/uL — ABNORMAL HIGH (ref 1.7–7.7)
PLATELETS: 308 10*3/uL (ref 150–400)
RBC: 3.29 MIL/uL — ABNORMAL LOW (ref 3.87–5.11)
RDW: 16.3 % — AB (ref 11.5–15.5)
WBC: 12.2 10*3/uL — AB (ref 4.0–10.5)

## 2018-05-25 LAB — TROPONIN I: Troponin I: 0.27 ng/mL (ref <0.03)

## 2018-05-25 MED ORDER — SENNOSIDES-DOCUSATE SODIUM 8.6-50 MG PO TABS
1.0000 | ORAL_TABLET | Freq: Two times a day (BID) | ORAL | Status: DC
  Administered 2018-05-25 – 2018-05-27 (×5): 1 via ORAL
  Filled 2018-05-25 (×5): qty 1

## 2018-05-25 MED ORDER — FLEET ENEMA 7-19 GM/118ML RE ENEM
1.0000 | ENEMA | Freq: Once | RECTAL | Status: AC
  Administered 2018-05-25: 1 via RECTAL
  Filled 2018-05-25 (×2): qty 1

## 2018-05-25 MED ORDER — POLYETHYLENE GLYCOL 3350 17 G PO PACK
17.0000 g | PACK | Freq: Every day | ORAL | Status: DC
  Administered 2018-05-25: 17 g via ORAL
  Filled 2018-05-25: qty 1

## 2018-05-25 MED ORDER — ALBUTEROL SULFATE (2.5 MG/3ML) 0.083% IN NEBU
2.5000 mg | INHALATION_SOLUTION | RESPIRATORY_TRACT | Status: DC | PRN
  Administered 2018-05-25 – 2018-05-26 (×2): 2.5 mg via RESPIRATORY_TRACT
  Filled 2018-05-25 (×2): qty 3

## 2018-05-25 MED ORDER — IPRATROPIUM-ALBUTEROL 0.5-2.5 (3) MG/3ML IN SOLN
3.0000 mL | Freq: Two times a day (BID) | RESPIRATORY_TRACT | Status: DC
  Administered 2018-05-25 – 2018-05-27 (×4): 3 mL via RESPIRATORY_TRACT
  Filled 2018-05-25 (×4): qty 3

## 2018-05-25 MED ORDER — FAMOTIDINE 20 MG PO TABS
10.0000 mg | ORAL_TABLET | Freq: Every day | ORAL | Status: DC
  Administered 2018-05-25 – 2018-05-26 (×2): 10 mg via ORAL
  Filled 2018-05-25 (×2): qty 1

## 2018-05-25 NOTE — Care Management Important Message (Signed)
Important Message  Patient Details  Name: [name] MRN: [id number] Date of Birth: November 19, 1946   Medicare Important Message Given:  Yes    Barb Merino Torrin Crihfield 05/25/2018, 3:44 PM

## 2018-05-25 NOTE — Plan of Care (Signed)
  Problem: Health Behavior/Discharge Planning: Goal: Ability to manage health-related needs will improve Outcome: Progressing   Problem: Clinical Measurements: Goal: Ability to maintain clinical measurements within normal limits will improve Outcome: Progressing Goal: Will remain free from infection Outcome: Progressing   

## 2018-05-25 NOTE — Progress Notes (Signed)
Family Medicine Teaching Service Daily Progress Note Intern Pager: [phone]  Patient name: [name] Medical record number: [id number] Date of birth: [birth year] Age: 72 y.o. Gender: female  Primary Care Provider: Steve Rattler, DO Consultants: None Code Status: Full  Pt Overview and Major Events to Date:  [name] is a 72 y.o. female presenting with shortness of breath. PMH is significant for COPD, polymyalgia rheumatica, hyperlipidemia, hypertension, GERD, CHF, CKD stage III, sleep apnea, tracheostomy, and GAD.   Assessment and Plan:  COPD Exacerbation Objectively has improved significantly. Remains on room air, with very mild bibasilar wheezing. Subjectively she feels her breathing is improved and has no more SOB. No signs of volume overload. BNP is elevated but at her baseline, no edema. Troponins elevated but downtrending. Initially 0.57 but down to 0.27. EKGs remain unchanged. Echo performed which showed LVEF 52-77% grade 2 diastolic dysfunction and mitral valve regurg. Patient without any controller medication. Could benefit from [place] prior to discharge. Her trach makes this more difficult but should be able to still use.  - Supplemental O2 as needed - discont to trend troponins - Cont Duonebs q4 prn - Doxycycline 100mg  BID (6/8-) for total of 5 days treatment, day 4 - Prednisone 50mg  daily (6/8-) for total of 5 days of treatment, day 4. - daily weights, strict I/O's - Start Tiotropium prior to discharge  Abdominal pain 2/2 likely constipation Has not had bowel movements for a couple of weeks. Will place on bowel regimen. - senna 8.6mg  prn - Fleece enema  Tracheostomy PlacedNovember 2017due to chronic upper airway narrowing.Had a direct laryngoscopy with ablation of subepiglottic granulation tissue on 10/16/16 and failed extubation at that time. Lurline Idol care ordered  HFpEF CHF Echo 65-70% which is improved from 55-60% on 10/2016. None-trace  edema appreciated BLE. Will restart home lasix. Lipid panel elevated with LDL 106. Will restart home crestor. Chronically elevated BNP. - continue home lasix - conitnue home crestor  CKD stage IIIB Creatinine improved from 2.15>1.64>1.86. Baseline creatinine appears to be 1.7-2.0. - restarting home Lasix  - Continue to monitor with daily am BMP  Polymyalgia rheumatica.Stable. At home on prednisone 10 mg p.o. daily. Per PCP note patient advised to follow-up with rheumatology to discuss steroid taperand symptoms of body aches. - appears to be on 10mg  prednisone per chart review. Giving 50mg  prednisone for 5 days for COPD exacerbation. Day 4 of 5 of 50mg   Hypertension.Normotensive within past 24hrs with SBP 125-136 and DBP 76-94. On home Lasix for CHF. On metoprolol prn for palpitations at home. - Continue to monitor - Cont home Lasix 40mg  BID  Hyperlipidemia Total cholesterol and LDL mildly elevated. Restart home crestor 20mg , could consider increase as outpatient.  Gout. Stable. At home on Allopurinol 100 mg.  - continue home Allopurinol  Anemia of Chronic Disease. Chronic. Stable. Hemoglobin 8.9 on admission, 8.7 this am (6/11). At home on supplemental iron FE sulfate 325 mg. Last iron panel was on 04/09/2017. Iron low at 16 with sat of 4% and normal TIBC of 421, Ferritin nl at 114 - Continue p.o. Iron - daily CBC  GERD. Stable. At home on omeprazole 20 mg daily - Substitute with Protonix 40 mg  GAD.Stable. Per chart review, patient taking 0.5 mg Xanax at bedtime prnfor anxiety.She states does not need this tonight, only in certain situationssuch aswhen going to the store. - Xanax 0.tmg prn at bedtime  FEN/GI: renal diet PPx: Heparin  Disposition: home  Subjective:  Patient states she feels improved. She  still endorses a productive cough but is no longer short of breath. Her main concern this morning is epigastric and abdominal pain that she says  radiates to her sides and to her chest. She states that sometimes the pain also radiates to her left arm but without SOB, nausea, vomiting, or any other symptoms.  Objective: Temp:  [97.6 F (36.4 C)] 97.6 F (36.4 C) (06/11 0500) Pulse Rate:  [70] 70 (06/11 0500) Resp:  [16-20] 19 (06/11 0500) BP: (125-136)/(76-94) 125/76 (06/11 0500) SpO2:  [96 %-100 %] 98 % (06/11 0500) Weight:  [169 lb 14.4 oz (77.1 kg)] 169 lb 14.4 oz (77.1 kg) (06/11 0500) Physical Exam:  Gen: Alert and Oriented x 3, NAD HEENT: Normocephalic, atraumatic, PERRLA, EOMI CV: RRR, no murmurs, normal S1, S2 split, +2 pulses dorsalis pedis bilaterally Resp: CTAB, mild bibasilar wheezing, rales, or rhonchi, comfortable work of breathing Abd: non-distended, tender to palpation in epigastric, RUQ and LUQ, soft, +bs in all four quadrants Ext: no clubbing, cyanosis, or edema Skin: warm, dry, intact, no rashes  Laboratory: Recent Labs  Lab 05/23/18 0521 05/24/18 0544 05/25/18 0332  WBC 8.2 15.1* 12.2*  HGB 8.4* 8.6* 8.7*  HCT 28.3* 28.8* 29.4*  PLT 319 320 308   Recent Labs  Lab 05/22/18 1733 05/23/18 0521 05/24/18 0544 05/25/18 0332  NA 135 136 138 139  K 4.0 3.9 4.9 4.7  CL 96* 97* 102 102  CO2 25 24 28 28   BUN 62* 58* 52* 51*  CREATININE 1.96* 2.15* 1.64* 1.86*  CALCIUM 9.3 9.7 9.7 9.8  PROT 6.9  --   --  6.2*  BILITOT 0.5  --   --  0.4  ALKPHOS 48  --   --  49  ALT 15  --   --  12*  AST 26  --   --  19  GLUCOSE 176* 423* 131* 145*    BNP: 230.1 I-stat Troponin: 0.13 Troponin I x 3: 0.16 > 0.43 > 0.38 > 0.35 > 0.27 > 0.27 EKG: paced sinus rhythm, possible RVH, poor quality due to atrial pacemaker  Imaging/Diagnostic Tests: CXR: FINDINGS: The heart size and mediastinal contours are stable. Heart size is enlarged. Cardiac pacemaker is unchanged. Tracheostomy tube is in good position. There is pulmonary edema. There is no focal pneumonia or pleural effusion. The visualized skeletal structures  are unremarkable.  IMPRESSION: Congestive heart failure.  ECHO: LVEF 65-70%, LV cavity size normal with mild concentric hypertrophy; no wall motion abnormalities, grade 2 diastolic dysfunction.   Nuala Alpha, DO 05/25/2018, 7:02 AM PGY-1, [place] Intern pager: [phone], text pages welcome

## 2018-05-26 LAB — CBC
HCT: 29.1 % — ABNORMAL LOW (ref 36.0–46.0)
Hemoglobin: 8.6 g/dL — ABNORMAL LOW (ref 12.0–15.0)
MCH: 26.4 pg (ref 26.0–34.0)
MCHC: 29.6 g/dL — ABNORMAL LOW (ref 30.0–36.0)
MCV: 89.3 fL (ref 78.0–100.0)
PLATELETS: 306 10*3/uL (ref 150–400)
RBC: 3.26 MIL/uL — ABNORMAL LOW (ref 3.87–5.11)
RDW: 16.3 % — ABNORMAL HIGH (ref 11.5–15.5)
WBC: 8.9 10*3/uL (ref 4.0–10.5)

## 2018-05-26 LAB — BASIC METABOLIC PANEL
ANION GAP: 9 (ref 5–15)
BUN: 62 mg/dL — ABNORMAL HIGH (ref 6–20)
CALCIUM: 9.8 mg/dL (ref 8.9–10.3)
CO2: 28 mmol/L (ref 22–32)
Chloride: 103 mmol/L (ref 101–111)
Creatinine, Ser: 1.96 mg/dL — ABNORMAL HIGH (ref 0.44–1.00)
GFR, EST AFRICAN AMERICAN: 28 mL/min — AB (ref >60)
GFR, EST NON AFRICAN AMERICAN: 24 mL/min — AB (ref >60)
Glucose, Bld: 150 mg/dL — ABNORMAL HIGH (ref 65–99)
Potassium: 4.3 mmol/L (ref 3.5–5.1)
Sodium: 140 mmol/L (ref 135–145)

## 2018-05-26 MED ORDER — TIOTROPIUM BROMIDE MONOHYDRATE 1.25 MCG/ACT IN AERS: 2.0000 | INHALATION_SPRAY | Freq: Every day | RESPIRATORY_TRACT | 0 refills | Status: DC

## 2018-05-26 MED ORDER — SORBITOL 70 % SOLN
960.0000 mL | TOPICAL_OIL | Freq: Once | ORAL | Status: AC
  Administered 2018-05-26: 960 mL via RECTAL
  Filled 2018-05-26 (×2): qty 473

## 2018-05-26 MED ORDER — FLEET ENEMA 7-19 GM/118ML RE ENEM
1.0000 | ENEMA | Freq: Once | RECTAL | Status: DC
  Filled 2018-05-26: qty 1

## 2018-05-26 NOTE — Progress Notes (Signed)
RN called to bedside by pt. States she feels like there is something in her throat. RN suctioned pt but nothing came up. RT called to bedside. Rt suctioned with little mucus return. Pt states she feels the same.  Provider notified. Stated to give pt another albuterol treatment and she will round on pt in the morning. Will continue to monitor.

## 2018-05-26 NOTE — Progress Notes (Signed)
Visit made to patients room again she is feeling like something is stuck and wont go down.  Suctioned only got a small amount pt still states feeling the same.  Lavaged and suctioned with no relief on patients complaint.   SPO2  remains at 100% WOB is normal as well.  RN calling doctor.  Some of this seems to be that patient is anxious.  She also changed her trach herself today (same size 4) I verified placement with end tidal CO2 detector positive color change was seen.

## 2018-05-26 NOTE — Progress Notes (Addendum)
Family Medicine Teaching Service Daily Progress Note Intern Pager: [phone]  Patient name: [name] Medical record number: [id number] Date of birth: Sep 17, 1946 Age: 72 y.o. Gender: female  Primary Care Provider: Steve Rattler, DO Consultants: None Code Status: Full  Pt Overview and Major Events to Date:  [name] is a 72 y.o. female presenting with shortness of breath. PMH is significant for COPD, polymyalgia rheumatica, hyperlipidemia, hypertension, GERD, CHF, CKD stage III, sleep apnea, tracheostomy, and GAD.   Assessment and Plan:  COPD Exacerbation Satting 100% on room air. Wheezing resolved this am. Now objectively ok for discharge. Had suctioning overnight which did resolve some SOB issues. Will send home on LAMA as controller medication. - Supplemental O2 as needed - Cont Duonebs q4 prn - Doxycycline 100mg  BID (6/8-) for total of 5 days treatment, day 5 - Prednisone 50mg  daily (6/8-) for total of 5 days of treatment, day 5. - daily weights, strict I/O's - Start Tiotropium prior to discharge  Abdominal pain 2/2 likely constipation Received Fleets x1, with good output. Will given SMOG enema this am. Can discharge with bowel regimen. - docusate, miralax as outpt - smog enema  Tracheostomy PlacedNovember 2017due to chronic upper airway narrowing.Had a direct laryngoscopy with ablation of subepiglottic granulation tissue on 10/16/16 and failed extubation at that time. Lurline Idol care ordered  HFpEF CHF Echo 65-70% which is improved from 55-60% on 10/2016. None-trace edema appreciated BLE. Will restart home lasix. Lipid panel elevated with LDL 106. Will restart home crestor. Chronically elevated BNP. - continue home lasix - conitnue home crestor  CKD stage IIIB Creatinine improved from 2.15>1.64>1.86>1.96. Baseline creatinine appears to be 1.7-2.0. Slight bump in cr, still within baseline. - continue home Lasix  - Continue to monitor with  daily am BMP  Polymyalgia rheumatica.Stable. At home on prednisone 10 mg p.o. daily. Per PCP note patient advised to follow-up with rheumatology to discuss steroid taperand symptoms of body aches. - appears to be on 10mg  prednisone per chart review. Giving 50mg  prednisone for 5 days for COPD exacerbation. Day 5 of 5 of 50mg   Hypertension.Normotensive within past 24hrs with SBP 125-136 and DBP 76-94. On home Lasix for CHF. On metoprolol prn for palpitations at home. - Continue to monitor - Cont home Lasix 40mg  BID  Hyperlipidemia Total cholesterol and LDL mildly elevated. Restart home crestor 20mg , could consider increase as outpatient.  Gout. Stable. At home on Allopurinol 100 mg.  - continue home Allopurinol  Anemia of Chronic Disease. Chronic. Stable. Hemoglobin 8.9 on admission, 8.7 this am (6/11). At home on supplemental iron FE sulfate 325 mg. Last iron panel was on 04/09/2017. Iron low at 16 with sat of 4% and normal TIBC of 421, Ferritin nl at 114 - Continue p.o. Iron - daily CBC  GERD. Stable. At home on omeprazole 20 mg daily - Substitute with Protonix 40 mg  GAD.Stable. Per chart review, patient taking 0.5 mg Xanax at bedtime prnfor anxiety.She states does not need this tonight, only in certain situationssuch aswhen going to the store. - Xanax 0.tmg prn at bedtime  FEN/GI: renal diet PPx: Heparin  Disposition: home  Subjective:  Feeling ok this am. Wants another enema for constipation.  Objective: Temp:  [98.1 F (36.7 C)] 98.1 F (36.7 C) (06/12 0537) Pulse Rate:  [70-83] 70 (06/12 1224) Resp:  [14-20] 20 (06/12 0537) BP: (129-135)/(70-77) 129/70 (06/12 0537) SpO2:  [98 %-100 %] 100 % (06/12 1224) Weight:  [170 lb 14.4 oz (77.5 kg)] 170 lb 14.4 oz (  77.5 kg) (06/12 0534) Physical Exam: Gen: Alert and Oriented x 3, No Acute Distress HEENT: [place], AT, PERRLA, EOMI CV: Regular Rate Rhythm, no m/r/g, +2 pulses dorsalis pedis  bilaterally Resp: Clear to ausculation bilaterally Abd: non-distended, tender to palpation in epigastric, RUQ and LUQ, soft, +bs in all four quadrants Ext: no clubbing, cyanosis, or edema Skin: warm, dry, intact, no rashes  Laboratory: Recent Labs  Lab 05/24/18 0544 05/25/18 0332 05/26/18 0450  WBC 15.1* 12.2* 8.9  HGB 8.6* 8.7* 8.6*  HCT 28.8* 29.4* 29.1*  PLT 320 308 306   Recent Labs  Lab 05/22/18 1733  05/24/18 0544 05/25/18 0332 05/26/18 0450  NA 135   < > 138 139 140  K 4.0   < > 4.9 4.7 4.3  CL 96*   < > 102 102 103  CO2 25   < > 28 28 28   BUN 62*   < > 52* 51* 62*  CREATININE 1.96*   < > 1.64* 1.86* 1.96*  CALCIUM 9.3   < > 9.7 9.8 9.8  PROT 6.9  --   --  6.2*  --   BILITOT 0.5  --   --  0.4  --   ALKPHOS 48  --   --  49  --   ALT 15  --   --  12*  --   AST 26  --   --  19  --   GLUCOSE 176*   < > 131* 145* 150*   < > = values in this interval not displayed.    BNP: 230.1 I-stat Troponin: 0.13 Troponin I x 3: 0.16 > 0.43 > 0.38 > 0.35 > 0.27 > 0.27 EKG: paced sinus rhythm, possible RVH, poor quality due to atrial pacemaker  Imaging/Diagnostic Tests: CXR: FINDINGS: The heart size and mediastinal contours are stable. Heart size is enlarged. Cardiac pacemaker is unchanged. Tracheostomy tube is in good position. There is pulmonary edema. There is no focal pneumonia or pleural effusion. The visualized skeletal structures are unremarkable.  IMPRESSION: Congestive heart failure.  ECHO: LVEF 65-70%, LV cavity size normal with mild concentric hypertrophy; no wall motion abnormalities, grade 2 diastolic dysfunction.   Guadalupe Dawn, MD 05/26/2018, 2:10 PM PGY-1, [place] Intern pager: [phone], text pages welcome

## 2018-05-26 NOTE — Progress Notes (Signed)
Upon entering patients room she stated she felt like it was getting hard to breathe and that she thinks she has pneumonia. Patient stated she was afraid to lie down. RN notified RT. RT suctioned pt and removed several mucus plugs. PRN albuterol treatment given and pt placed aerosol trach collar for humidification with 28% fIO2. Will continue to monitor pt.

## 2018-05-26 NOTE — Progress Notes (Signed)
Patient given smog enema with large results.

## 2018-05-26 NOTE — Progress Notes (Signed)
Visited made to patients room RN at bedside pt worked up in distress stating she feels something is restricting her breathing.  I lavaged and suctioned patient X 2 after checking inner cannula and verifying that it wasn't clogged.  Suctioned revealed 2 small yellowish plugs.  I advised patient to keep PMV off for a while and throughout the night and I added aerosol trach collar as well for humidification  with 28% fIO2 for O2 support.  RT and RN will continue to monitor patient throughout the night.  Patient was given a PRN Albuterol treatment.

## 2018-05-27 LAB — CBC WITH DIFFERENTIAL/PLATELET
Abs Immature Granulocytes: 0.1 10*3/uL (ref 0.0–0.1)
BASOS ABS: 0 10*3/uL (ref 0.0–0.1)
BASOS PCT: 0 %
EOS ABS: 0 10*3/uL (ref 0.0–0.7)
Eosinophils Relative: 0 %
HCT: 28.3 % — ABNORMAL LOW (ref 36.0–46.0)
Hemoglobin: 8.3 g/dL — ABNORMAL LOW (ref 12.0–15.0)
IMMATURE GRANULOCYTES: 1 %
Lymphocytes Relative: 15 %
Lymphs Abs: 1.1 10*3/uL (ref 0.7–4.0)
MCH: 26 pg (ref 26.0–34.0)
MCHC: 29.3 g/dL — ABNORMAL LOW (ref 30.0–36.0)
MCV: 88.7 fL (ref 78.0–100.0)
MONOS PCT: 7 %
Monocytes Absolute: 0.5 10*3/uL (ref 0.1–1.0)
NEUTROS PCT: 77 %
Neutro Abs: 5.6 10*3/uL (ref 1.7–7.7)
PLATELETS: 301 10*3/uL (ref 150–400)
RBC: 3.19 MIL/uL — AB (ref 3.87–5.11)
RDW: 15.9 % — AB (ref 11.5–15.5)
WBC: 7.3 10*3/uL (ref 4.0–10.5)

## 2018-05-27 LAB — BASIC METABOLIC PANEL
ANION GAP: 11 (ref 5–15)
BUN: 63 mg/dL — ABNORMAL HIGH (ref 6–20)
CALCIUM: 10.1 mg/dL (ref 8.9–10.3)
CO2: 30 mmol/L (ref 22–32)
Chloride: 99 mmol/L — ABNORMAL LOW (ref 101–111)
Creatinine, Ser: 2.01 mg/dL — ABNORMAL HIGH (ref 0.44–1.00)
GFR calc Af Amer: 27 mL/min — ABNORMAL LOW (ref >60)
GFR, EST NON AFRICAN AMERICAN: 23 mL/min — AB (ref >60)
GLUCOSE: 118 mg/dL — AB (ref 65–99)
Potassium: 4.2 mmol/L (ref 3.5–5.1)
SODIUM: 140 mmol/L (ref 135–145)

## 2018-05-27 NOTE — Discharge Instructions (Signed)
You were admitted for a COPD exacerbation, likely 2/2 a viral URI. You were treated with steroids and antibiotics for 5 days. You are being discharged on a controller medication called spiriva which should help prevent COPD exacerbations.  Chronic Obstructive Pulmonary Disease Exacerbation Chronic obstructive pulmonary disease (COPD) is a common lung problem. In COPD, the flow of air from the lungs is limited. COPD exacerbations are times that breathing gets worse and you need extra treatment. Without treatment they can be life threatening. If they happen often, your lungs can become more damaged. If your COPD gets worse, your doctor may treat you with:  Medicines.  Oxygen.  Different ways to clear your airway, such as using a mask.  Follow these instructions at home:  Do not smoke.  Avoid tobacco smoke and other things that bother your lungs.  If given, take your antibiotic medicine as told. Finish the medicine even if you start to feel better.  Only take medicines as told by your doctor.  Drink enough fluids to keep your pee (urine) clear or pale yellow (unless your doctor has told you not to).  Use a cool mist machine (vaporizer).  If you use oxygen or a machine that turns liquid medicine into a mist (nebulizer), continue to use them as told.  Keep up with shots (vaccinations) as told by your doctor.  Exercise regularly.  Eat healthy foods.  Keep all doctor visits as told. Get help right away if:  You are very short of breath and it gets worse.  You have trouble talking.  You have bad chest pain.  You have blood in your spit (sputum).  You have a fever.  You keep throwing up (vomiting).  You feel weak, or you pass out (faint).  You feel confused.  You keep getting worse. This information is not intended to replace advice given to you by your health care provider. Make sure you discuss any questions you have with your health care provider. Document Released:  11/20/2011 Document Revised: 05/08/2016 Document Reviewed: 08/05/2013 Elsevier Interactive Patient Education  2017 [employer].

## 2018-05-27 NOTE — Progress Notes (Signed)
Pt ready for discharge from unit to home. All d/c instructions reviewed with pt. All personal belongings with patient.  Medications and follow up appts reviewed with pt. Pt provides her own tach care and is very proficient in doing so.  Pt is alert and oriented and in no distress at this time of discharge.

## 2018-05-27 NOTE — Plan of Care (Signed)
  Problem: Activity: Goal: Risk for activity intolerance will decrease Outcome: Completed/Met   Problem: Elimination: Goal: Will not experience complications related to bowel motility Outcome: Completed/Met

## 2018-05-27 NOTE — Consult Note (Signed)
   [facility] CM Inpatient Consult   05/27/2018  [name] December 02, 1946 [id number]  Patient admitted with COPD exacerbation with long term tracheostomy. Patient is in the [employer].  Patient assessed for post hospital follow up with high risk for unplanned readmission (25%).  Spoke with the patient regarding restart services with [place] [facility].  Patient has an active consent on file. Patient states she would like some follow up.  Patient will also have [facility] for home health per inpatient RNCM.  Patient was assessed for [place] [facility] for community services. Patient was previously active with [place] [facility].   Of note, [facility] Care Management services does not replace or interfere with any services that are arranged by inpatient case management or social work. For additional questions or referrals please contact:  Natividad Brood, RN BSN [facility] Liaison  [phone] business mobile phone Toll free office [phone]

## 2018-05-27 NOTE — Discharge Summary (Signed)
[facility] Discharge Summary  Patient name: [name] Medical record number: [id number] Date of birth: [birth year] Age: 72 y.o. Gender: female Date of Admission: 05/22/2018  Date of Discharge: 05/27/2018 Admitting Physician: Zenia Resides, MD  Primary Care Provider: Steve Rattler, DO Consultants: none  Indication for Hospitalization: COPD exacerbation  Discharge Diagnoses/Problem List:  COPD exacerbation Constipation Tracheostomy HFpEF chf ckd stage IIIb Polymyalgia rheumatica hypertensino Hyperlipidemia Gout Anemia of chronic disease gerd gad  Disposition: home  Discharge Condition: stable  Discharge Exam: Gen: Alert and Oriented x 3, No Acute Distress HEENT: [place], AT, PERRLA, EOMI CV: Regular Rate Rhythm, no m/r/g, +2 pulses dorsalis pedis bilaterally Resp: Clear to ausculation bilaterally Abd: non-distended, tender to palpation in epigastric, RUQ and LUQ, soft, +bs in all four quadrants Ext: no clubbing, cyanosis, or edema Skin: warm, dry, intact, no rashes  Brief Hospital Course:  72 year old female who presented on 6/8. She had shortness of breath, wheezing, consistent with copd exacerbation. She received a 5 day course of prednisone and doxycycline. She continued to slowly improve and was back on room air prior to discharge. Patient was discharged with a LAMA as a controller medication. Education was provided to ensure patient understood that her spiriva was for daily use and albuterol only for exacerbations.  Patient also had abdominal pain 2/2 constipation. She received a fleets enema x1 and a SMOG enema x1. Had a very large amount of stool output. Abdominal pain resolved.  Issues for Follow Up:  1. Monitor respiratory status, o2 sats 2. Discuss if LAMA is good long-term option for controller  Significant Procedures: none  Significant Labs and Imaging:  Recent Labs  Lab 05/25/18 0332 05/26/18 0450 05/27/18 0607   WBC 12.2* 8.9 7.3  HGB 8.7* 8.6* 8.3*  HCT 29.4* 29.1* 28.3*  PLT 308 306 301   Recent Labs  Lab 05/22/18 1733 05/22/18 2314 05/23/18 0521 05/24/18 0544 05/25/18 0332 05/26/18 0450 05/27/18 0607  NA 135  --  136 138 139 140 140  K 4.0  --  3.9 4.9 4.7 4.3 4.2  CL 96*  --  97* 102 102 103 99*  CO2 25  --  24 28 28 28 30   GLUCOSE 176*  --  423* 131* 145* 150* 118*  BUN 62*  --  58* 52* 51* 62* 63*  CREATININE 1.96*  --  2.15* 1.64* 1.86* 1.96* 2.01*  CALCIUM 9.3  --  9.7 9.7 9.8 9.8 10.1  MG  --  2.1  --   --   --   --   --   PHOS  --  3.9  --   --   --   --   --   ALKPHOS 48  --   --   --  49  --   --   AST 26  --   --   --  19  --   --   ALT 15  --   --   --  12*  --   --   ALBUMIN 3.6  --   --   --  3.0*  --   --       Results/Tests Pending at Time of Discharge:  Discharge Medications:  Allergies as of 05/27/2018      Reactions   Ace Inhibitors Swelling   angiodema   Other Anaphylaxis, Swelling   Reaction to unspecified anesthesia       Medication List    TAKE  these medications   acetaminophen 325 MG tablet Commonly known as:  TYLENOL Take 325-650 mg by mouth every 6 (six) hours as needed for mild pain, fever or headache.   albuterol (2.5 MG/3ML) 0.083% nebulizer solution Commonly known as:  PROVENTIL Take 3 mLs (2.5 mg total) by nebulization every 6 (six) hours as needed for wheezing or shortness of breath.   albuterol 108 (90 Base) MCG/ACT inhaler Commonly known as:  PROVENTIL HFA;VENTOLIN HFA Inhale 1-2 puffs into the lungs every 6 (six) hours as needed for wheezing or shortness of breath.   allopurinol 100 MG tablet Commonly known as:  ZYLOPRIM Take 200 mg by mouth daily.   ALPRAZolam 0.5 MG tablet Commonly known as:  XANAX TAKE 1 TABLET BY MOUTH AT BEDTIME AS NEEDED FOR ANXIETY   aspirin EC 81 MG tablet Take 81 mg by mouth daily.   cetirizine 10 MG tablet Commonly known as:  ZYRTEC Take 1 tablet (10 mg total) by mouth daily.   ferrous  sulfate 325 (65 FE) MG tablet TAKE 1 TABLET BY MOUTH TWICE DAILY WITH MEALS TO KEEP BLOOD COUNT UP What changed:    how much to take  how to take this  when to take this  additional instructions   furosemide 40 MG tablet Commonly known as:  LASIX Take 1 tablet (40 mg total) by mouth 2 (two) times daily.   Guaifenesin 1200 MG Tb12 Take 1 tablet (1,200 mg total) by mouth 2 (two) times daily.   latanoprost 0.005 % ophthalmic solution Commonly known as:  XALATAN Place one drop in both eyes at bedtime.   metoprolol tartrate 25 MG tablet Commonly known as:  LOPRESSOR TAKE 1 TABLET BY MOUTH EVERY 6 HOURS AS NEEDED FOR HEART FLUTTERING   nitroGLYCERIN 0.4 MG SL tablet Commonly known as:  NITROSTAT Place 1 tablet (0.4 mg total) under the tongue every 5 (five) minutes as needed for chest pain.   Olopatadine HCl 0.2 % Soln Place one to two drops in each once daily as needed for itching.   omeprazole 20 MG capsule Commonly known as:  PRILOSEC TAKE ONE CAPSULE BY MOUTH DAILY   polyethylene glycol packet Commonly known as:  MIRALAX / GLYCOLAX Take 17 g by mouth daily as needed for mild constipation.   potassium chloride 10 MEQ tablet Commonly known as:  K-DUR Take 1 tablet (10 mEq total) by mouth daily.   predniSONE 1 MG tablet Commonly known as:  DELTASONE Take 4 mg by mouth daily with breakfast.   ranitidine 150 MG tablet Commonly known as:  ZANTAC Take 1 tablet (150 mg total) by mouth daily.   rosuvastatin 20 MG tablet Commonly known as:  CRESTOR Take 20 mg by mouth daily.   senna 8.6 MG Tabs tablet Commonly known as:  SENOKOT Take 1 tablet by mouth daily as needed for mild constipation.   Tiotropium Bromide Monohydrate 1.25 MCG/ACT Aers Commonly known as:  SPIRIVA RESPIMAT Inhale 2 puffs into the lungs daily at 6 (six) AM.   triamcinolone cream 0.1 % Commonly known as:  KENALOG Apply 1 application topically 2 (two) times daily.   Vitamin D (Ergocalciferol)  50000 units Caps capsule Commonly known as:  DRISDOL Take 50,000 Units by mouth See admin instructions. Take one capsule by mouth every 7 days for eight weeks, then take one capsule once a month.       Discharge Instructions: Please refer to Patient Instructions section of EMR for full details.  Patient was counseled important signs  and symptoms that should prompt return to medical care, changes in medications, dietary instructions, activity restrictions, and follow up appointments.   Follow-Up Appointments: Follow-up [place].   Why:  A home health care nurse will see you at your home Contact information: [address] [place] [address] [phone]           Guadalupe Dawn, MD 05/27/2018, 6:13 PM PGY-1, [place]

## 2018-05-28 ENCOUNTER — Other Ambulatory Visit: Payer: Self-pay

## 2018-05-28 NOTE — Patient Outreach (Signed)
[facility]) Care Management  05/28/2018  [name] 1946/02/09 [id number]   Referral received 05/28/18 from hospital liaison. Client's primary care provider to complete transition of care. RNCM following for care management needs.  72 year old with history of COPD, tracheostomy. history of heart failure, generalized anxiety disorder, depression, chronic cough, polymyalgia rheumatic, HTN, CKD, pacemaker. Admitted 05/22/18-05/27/18 with COPD exacerbation.   RNCM called to follow up. No answer on either home or mobile number. RNCM left voice messages on each voicemail.  Plan: send outreach letter and wait 3-4 business days to follow up.  Thea Silversmith, RN, MSN, [place] Coordinator Cell: [phone]

## 2018-05-30 DIAGNOSIS — M353 Polymyalgia rheumatica: Secondary | ICD-10-CM | POA: Diagnosis not present

## 2018-05-30 DIAGNOSIS — D631 Anemia in chronic kidney disease: Secondary | ICD-10-CM | POA: Diagnosis not present

## 2018-05-30 DIAGNOSIS — Z7952 Long term (current) use of systemic steroids: Secondary | ICD-10-CM | POA: Diagnosis not present

## 2018-05-30 DIAGNOSIS — J441 Chronic obstructive pulmonary disease with (acute) exacerbation: Secondary | ICD-10-CM | POA: Diagnosis not present

## 2018-05-30 DIAGNOSIS — N183 Chronic kidney disease, stage 3 (moderate): Secondary | ICD-10-CM | POA: Diagnosis not present

## 2018-05-30 DIAGNOSIS — Z930 Tracheostomy status: Secondary | ICD-10-CM | POA: Diagnosis not present

## 2018-05-30 DIAGNOSIS — M109 Gout, unspecified: Secondary | ICD-10-CM | POA: Diagnosis not present

## 2018-05-30 DIAGNOSIS — I482 Chronic atrial fibrillation: Secondary | ICD-10-CM | POA: Diagnosis not present

## 2018-05-30 DIAGNOSIS — I5030 Unspecified diastolic (congestive) heart failure: Secondary | ICD-10-CM | POA: Diagnosis not present

## 2018-05-30 DIAGNOSIS — E1122 Type 2 diabetes mellitus with diabetic chronic kidney disease: Secondary | ICD-10-CM | POA: Diagnosis not present

## 2018-05-30 DIAGNOSIS — I130 Hypertensive heart and chronic kidney disease with heart failure and stage 1 through stage 4 chronic kidney disease, or unspecified chronic kidney disease: Secondary | ICD-10-CM | POA: Diagnosis not present

## 2018-05-30 DIAGNOSIS — Z87891 Personal history of nicotine dependence: Secondary | ICD-10-CM | POA: Diagnosis not present

## 2018-05-31 ENCOUNTER — Inpatient Hospital Stay: Payer: Medicare Other | Admitting: Family Medicine

## 2018-06-01 ENCOUNTER — Other Ambulatory Visit: Payer: Self-pay

## 2018-06-01 NOTE — Patient Outreach (Signed)
[facility]) Care Management  06/01/2018  [name] May 05, 1946 [id number]   Referral received 05/28/18 from hospital liaison. Client's primary care provider to complete transition of care. RNCM following for care management needs.  72 year old with history of COPD, tracheostomy. history of heart failure, generalized anxiety disorder, depression, chronic cough, polymyalgia rheumatic, HTN, CKD, pacemaker. Admitted 05/22/18-05/27/18 with COPD exacerbation  No answer. HIPPA compliant message left on voicemail. Outreach letter sent 05/28/18.  Plan: follow up call within 3-4 business days.  Thea Silversmith, RN, MSN, [place] Coordinator Cell: [phone]

## 2018-06-01 NOTE — Patient Outreach (Signed)
[facility]) Care Management  06/01/2018  Liviah Cake 1946-10-28 [id number]  EMMI Red flag: "questions problems with meds"  Referral received 05/28/18 from hospital liaison. Client's primary care provider to complete transition of care. RNCM following for care management needs.  72 year old with history of COPD, tracheostomy. history of heart failure, generalized anxiety disorder, depression, chronic cough, polymyalgia rheumatic, HTN, CKD, pacemaker. Admitted 05/22/18-05/27/18 with COPD exacerbation  RNCM received EMMI red flag: RNCM attempted to call client again on her mobile phone. Client answered the phone. Identified two patient identifiers. She reports she has been talking with advanced home care regarding humidified settings on her oxygen. She states she is at her "eye doctor" appointment now and cannot talk any longer. Request RNCM call back.  Plan: RNCM will call back later today.  Thea Silversmith, RN, MSN, [place] Coordinator Cell: [phone]

## 2018-06-01 NOTE — Patient Outreach (Signed)
Patient triggered Red on [facility] COPD Dashboard, notification sent to:  Thea Silversmith, RN

## 2018-06-01 NOTE — Patient Outreach (Signed)
[facility]) Care Management  06/01/2018  Ineta Sinning 02/04/1946 [id number]  Care Coordination. RNCM called [facility] respiratory department and spoke with Amber. Amber reports that client's previous respiratory therapist has retired. RNCM request a therapist call and make home visit regarding equipment. RNCM's contact number provided.  Plan: continue to follow.  Thea Silversmith, RN, MSN, [place] Coordinator Cell: [phone]

## 2018-06-01 NOTE — Patient Outreach (Signed)
[facility]) Care Management  06/01/2018  [name] 06-05-1946 [id number]  Referral received 05/28/18 from hospital liaison. Client's primary care provider to complete transition of care. RNCM following for care management needs.  Subjective: "it is getting the padd that is around my neck wet".  72 year old with history of COPD, tracheostomy. history of heart failure, generalized anxiety disorder, depression, chronic cough, polymyalgia rheumatic, HTN, CKD, pacemaker. Admitted 05/22/18-05/27/18 with COPD exacerbation  RNCM called to discuss care management needs. Client with concerns regarding humidifier to trach issues. She reports the home health nurse came out to see her on today, but she was at her "eye doctor's" appointment. Per chart [facility] was ordered at discharge.   Upcoming appointment discussed: Appointment with primary care reschedule for June 20 th at 23.  Dr. Erik Obey trach doctor-scheduled for July 2nd.    No other issues identified.  Plan: RNCM will call home health respiratory department. Home visit next week.  Thea Silversmith, RN, MSN, [place] Coordinator Cell: [phone]

## 2018-06-02 ENCOUNTER — Telehealth: Payer: Self-pay

## 2018-06-02 DIAGNOSIS — J441 Chronic obstructive pulmonary disease with (acute) exacerbation: Secondary | ICD-10-CM | POA: Diagnosis not present

## 2018-06-02 DIAGNOSIS — M353 Polymyalgia rheumatica: Secondary | ICD-10-CM | POA: Diagnosis not present

## 2018-06-02 DIAGNOSIS — I482 Chronic atrial fibrillation: Secondary | ICD-10-CM | POA: Diagnosis not present

## 2018-06-02 DIAGNOSIS — E1122 Type 2 diabetes mellitus with diabetic chronic kidney disease: Secondary | ICD-10-CM | POA: Diagnosis not present

## 2018-06-02 DIAGNOSIS — Z7952 Long term (current) use of systemic steroids: Secondary | ICD-10-CM | POA: Diagnosis not present

## 2018-06-02 DIAGNOSIS — Z930 Tracheostomy status: Secondary | ICD-10-CM | POA: Diagnosis not present

## 2018-06-02 DIAGNOSIS — D631 Anemia in chronic kidney disease: Secondary | ICD-10-CM | POA: Diagnosis not present

## 2018-06-02 DIAGNOSIS — I5030 Unspecified diastolic (congestive) heart failure: Secondary | ICD-10-CM | POA: Diagnosis not present

## 2018-06-02 DIAGNOSIS — Z87891 Personal history of nicotine dependence: Secondary | ICD-10-CM | POA: Diagnosis not present

## 2018-06-02 DIAGNOSIS — I130 Hypertensive heart and chronic kidney disease with heart failure and stage 1 through stage 4 chronic kidney disease, or unspecified chronic kidney disease: Secondary | ICD-10-CM | POA: Diagnosis not present

## 2018-06-02 DIAGNOSIS — M109 Gout, unspecified: Secondary | ICD-10-CM | POA: Diagnosis not present

## 2018-06-02 DIAGNOSIS — N183 Chronic kidney disease, stage 3 (moderate): Secondary | ICD-10-CM | POA: Diagnosis not present

## 2018-06-02 NOTE — Telephone Encounter (Signed)
Laquan from [facility] called for verbal orders:  Continue skilled nursing 3x/week x 1, 2x/week x 2  HH aide 2x/week x 2.  Call back is [phone]  Danley Danker, RN [facility] [place])

## 2018-06-03 ENCOUNTER — Ambulatory Visit (INDEPENDENT_AMBULATORY_CARE_PROVIDER_SITE_OTHER): Payer: Medicare Other | Admitting: Family Medicine

## 2018-06-03 ENCOUNTER — Other Ambulatory Visit: Payer: Self-pay

## 2018-06-03 ENCOUNTER — Encounter: Payer: Self-pay | Admitting: Family Medicine

## 2018-06-03 DIAGNOSIS — J441 Chronic obstructive pulmonary disease with (acute) exacerbation: Secondary | ICD-10-CM

## 2018-06-03 DIAGNOSIS — F418 Other specified anxiety disorders: Secondary | ICD-10-CM

## 2018-06-03 DIAGNOSIS — I422 Other hypertrophic cardiomyopathy: Secondary | ICD-10-CM

## 2018-06-03 DIAGNOSIS — I509 Heart failure, unspecified: Secondary | ICD-10-CM | POA: Diagnosis not present

## 2018-06-03 MED ORDER — ALPRAZOLAM 0.5 MG PO TABS: 0.5000 mg | ORAL_TABLET | Freq: Every day | ORAL | 0 refills | Status: DC | PRN

## 2018-06-03 MED ORDER — DOXYCYCLINE HYCLATE 100 MG PO TABS: 100.0000 mg | ORAL_TABLET | Freq: Two times a day (BID) | ORAL | 0 refills | Status: DC

## 2018-06-03 MED ORDER — ALLOPURINOL 100 MG PO TABS: 200.0000 mg | ORAL_TABLET | Freq: Every day | ORAL | 3 refills | Status: DC

## 2018-06-03 MED ORDER — ROSUVASTATIN CALCIUM 20 MG PO TABS: 20.0000 mg | ORAL_TABLET | Freq: Every day | ORAL | 3 refills | Status: DC

## 2018-06-03 MED ORDER — FUROSEMIDE 40 MG PO TABS: 40.0000 mg | ORAL_TABLET | Freq: Two times a day (BID) | ORAL | 3 refills | Status: DC

## 2018-06-03 MED ORDER — CETIRIZINE HCL 10 MG PO TABS: 10.0000 mg | ORAL_TABLET | Freq: Every day | ORAL | 3 refills | Status: DC

## 2018-06-03 MED ORDER — TRIAMCINOLONE ACETONIDE 0.1 % EX CREA: 1.0000 "application " | TOPICAL_CREAM | Freq: Two times a day (BID) | CUTANEOUS | 2 refills | Status: DC

## 2018-06-03 NOTE — Patient Outreach (Signed)
[facility]) Care Management  06/03/2018  [name] 1946/04/16 [id number]    EMMI red re: used rescue inhaler 3 times in 24 hours; questions/problems with medication.   72 year old with history of COPD, tracheostomy. history of heart failure, generalized anxiety disorder, depression, chronic cough, polymyalgia rheumatic, HTN, CKD, pacemaker. Admitted 05/22/18-05/27/18 with COPD exacerbation.  RNCM called to follow up. No answer on either home or mobile number. HIPPA compliant message left. Per chart, client seen at primary care office today.  Plan: await return call. Home visit scheduled for next week.  Thea Silversmith, RN, MSN, [place] Coordinator Cell: [phone]

## 2018-06-03 NOTE — Telephone Encounter (Signed)
Verbal orders given to [place].  Lucila Maine, DO PGY-2, [facility] 06/03/2018 4:06 PM

## 2018-06-03 NOTE — Patient Instructions (Addendum)
OK to stop omeprazole and just take ranitidine. I refilled your furosemide, rosuvastin, and ceterizine I generally treat people with antibiotics for 10 days.  I sent in a prescription for an antibiotic, doxycycline.   I will send a small prescription for your nerve pill, alprazolam.  That is a controlled substance.  I want your regular doctor, Dr. [name], to figure out how much you should be taking. You should see Dr. [name] in 2 weeks to make sure you are getting better. Take your rosuvastatin at night, it works better

## 2018-06-04 ENCOUNTER — Encounter: Payer: Self-pay | Admitting: Family Medicine

## 2018-06-04 DIAGNOSIS — M353 Polymyalgia rheumatica: Secondary | ICD-10-CM | POA: Diagnosis not present

## 2018-06-04 DIAGNOSIS — Z87891 Personal history of nicotine dependence: Secondary | ICD-10-CM | POA: Diagnosis not present

## 2018-06-04 DIAGNOSIS — I5030 Unspecified diastolic (congestive) heart failure: Secondary | ICD-10-CM | POA: Diagnosis not present

## 2018-06-04 DIAGNOSIS — E1122 Type 2 diabetes mellitus with diabetic chronic kidney disease: Secondary | ICD-10-CM | POA: Diagnosis not present

## 2018-06-04 DIAGNOSIS — Z7952 Long term (current) use of systemic steroids: Secondary | ICD-10-CM | POA: Diagnosis not present

## 2018-06-04 DIAGNOSIS — I482 Chronic atrial fibrillation: Secondary | ICD-10-CM | POA: Diagnosis not present

## 2018-06-04 DIAGNOSIS — D631 Anemia in chronic kidney disease: Secondary | ICD-10-CM | POA: Diagnosis not present

## 2018-06-04 DIAGNOSIS — J441 Chronic obstructive pulmonary disease with (acute) exacerbation: Secondary | ICD-10-CM | POA: Diagnosis not present

## 2018-06-04 DIAGNOSIS — I130 Hypertensive heart and chronic kidney disease with heart failure and stage 1 through stage 4 chronic kidney disease, or unspecified chronic kidney disease: Secondary | ICD-10-CM | POA: Diagnosis not present

## 2018-06-04 DIAGNOSIS — N183 Chronic kidney disease, stage 3 (moderate): Secondary | ICD-10-CM | POA: Diagnosis not present

## 2018-06-04 DIAGNOSIS — Z930 Tracheostomy status: Secondary | ICD-10-CM | POA: Diagnosis not present

## 2018-06-04 DIAGNOSIS — M109 Gout, unspecified: Secondary | ICD-10-CM | POA: Diagnosis not present

## 2018-06-04 NOTE — Assessment & Plan Note (Signed)
Refilled meds

## 2018-06-04 NOTE — Assessment & Plan Note (Signed)
[place] refill of alprazolam.  Recommend that she follow up in 2 weeks with her PCP for a long term plan for this controled substance.

## 2018-06-04 NOTE — Assessment & Plan Note (Signed)
Seems to have a mild reflair of COPD.  Given long steroid taper, I am reluctant to redose with steroids.  Given that my typical rx with doxy is 10 days and she only received 5 days, I think a good middle ground is to re prescribe doxy. Verified that she is taking her spiriva

## 2018-06-04 NOTE — Progress Notes (Signed)
   Subjective:    Patient ID: [name], female    DOB: 04/29/1946, 72 y.o.   MRN: [id number]  HPI   Hospital follow up for COPD exacerbation.  Patient was markedly improved at discharge.  However, seems to be slightly worse now.  She was treated with 5 day steroid burst and 5 days of doxy.  She is on a long, slow steroid taper (I am not sure why)  Current dose is 9 mg per day of pred.  Visiting nurse thought she heard wheezing yesterday.  Denies fever.  Some cough and some increase in tracheal secretions.  Also needed refills of several meds.  She specifically requested a refill of alprazolam, saying her breathing problems have made her very anxious.   Review of Systems     Objective:   Physical Exam VS. Nl RR not on O2 and no resp distress at rest. Lungs No wheeze.  Mildly prolonged exp phase.        Assessment & Plan:

## 2018-06-07 ENCOUNTER — Other Ambulatory Visit: Payer: Self-pay

## 2018-06-07 DIAGNOSIS — Z87891 Personal history of nicotine dependence: Secondary | ICD-10-CM | POA: Diagnosis not present

## 2018-06-07 DIAGNOSIS — J441 Chronic obstructive pulmonary disease with (acute) exacerbation: Secondary | ICD-10-CM | POA: Diagnosis not present

## 2018-06-07 NOTE — Patient Outreach (Signed)
[facility]) Care Management  06/07/2018  [name] [birth year] [id number]   72 year old with history of COPD, tracheostomy. history of heart failure, generalized anxiety disorder, depression, chronic cough, polymyalgia rheumatic, HTN, CKD, pacemaker. Admitted 05/22/18-05/27/18 with COPD exacerbation.  RNCM called to follow up and reschedule home visit. Client reports someone from [place] care has been out to look at her humidifier, but does not seem to be better. She reports they are supposed to send someone else to follow up. She reports the home health nurse is also aware. She reports she is still taking antibiotic prescribed by primary care at last office visit.  Plan: home visit rescheduled.  Thea Silversmith, RN, MSN, [place] Coordinator Cell: [phone]

## 2018-06-08 ENCOUNTER — Other Ambulatory Visit: Payer: Self-pay

## 2018-06-08 DIAGNOSIS — N183 Chronic kidney disease, stage 3 (moderate): Secondary | ICD-10-CM | POA: Diagnosis not present

## 2018-06-08 DIAGNOSIS — D649 Anemia, unspecified: Secondary | ICD-10-CM | POA: Diagnosis not present

## 2018-06-08 DIAGNOSIS — J449 Chronic obstructive pulmonary disease, unspecified: Secondary | ICD-10-CM | POA: Diagnosis not present

## 2018-06-08 DIAGNOSIS — I5030 Unspecified diastolic (congestive) heart failure: Secondary | ICD-10-CM | POA: Diagnosis not present

## 2018-06-08 DIAGNOSIS — I130 Hypertensive heart and chronic kidney disease with heart failure and stage 1 through stage 4 chronic kidney disease, or unspecified chronic kidney disease: Secondary | ICD-10-CM | POA: Diagnosis not present

## 2018-06-08 DIAGNOSIS — M353 Polymyalgia rheumatica: Secondary | ICD-10-CM | POA: Diagnosis not present

## 2018-06-08 DIAGNOSIS — I4891 Unspecified atrial fibrillation: Secondary | ICD-10-CM | POA: Diagnosis not present

## 2018-06-08 DIAGNOSIS — M109 Gout, unspecified: Secondary | ICD-10-CM | POA: Diagnosis not present

## 2018-06-08 DIAGNOSIS — J441 Chronic obstructive pulmonary disease with (acute) exacerbation: Secondary | ICD-10-CM | POA: Diagnosis not present

## 2018-06-08 DIAGNOSIS — E1122 Type 2 diabetes mellitus with diabetic chronic kidney disease: Secondary | ICD-10-CM | POA: Diagnosis not present

## 2018-06-08 DIAGNOSIS — I482 Chronic atrial fibrillation: Secondary | ICD-10-CM | POA: Diagnosis not present

## 2018-06-08 DIAGNOSIS — Z930 Tracheostomy status: Secondary | ICD-10-CM | POA: Diagnosis not present

## 2018-06-08 DIAGNOSIS — Z7952 Long term (current) use of systemic steroids: Secondary | ICD-10-CM | POA: Diagnosis not present

## 2018-06-08 DIAGNOSIS — D631 Anemia in chronic kidney disease: Secondary | ICD-10-CM | POA: Diagnosis not present

## 2018-06-08 DIAGNOSIS — Z87891 Personal history of nicotine dependence: Secondary | ICD-10-CM | POA: Diagnosis not present

## 2018-06-08 DIAGNOSIS — J398 Other specified diseases of upper respiratory tract: Secondary | ICD-10-CM | POA: Diagnosis not present

## 2018-06-08 NOTE — Patient Outreach (Addendum)
[facility]) Care Management   06/08/2018  [name] 06/10/1946 [id number]  [name] is an 72 y.o. female  Subjective: client reports feeling better since admission.  Objective:  BP 128/80   Pulse 70   Resp 20   SpO2 98%   Review of Systems  Respiratory:       Lung sounds clear on inspiration. Distant course sound noted expiration, but cleared with breathing.  Cardiovascular:       S1S2 noted, regular    Physical Exam respirations even unlabored.  Encounter Medications:   Outpatient Encounter Medications as of 06/08/2018  Medication Sig Note  . acetaminophen (TYLENOL) 325 MG tablet Take 325-650 mg by mouth every 6 (six) hours as needed for mild pain, fever or headache.    . albuterol (PROVENTIL HFA;VENTOLIN HFA) 108 (90 Base) MCG/ACT inhaler Inhale 1-2 puffs into the lungs every 6 (six) hours as needed for wheezing or shortness of breath. 05/24/2018: LF 02-25-18 25DS  . albuterol (PROVENTIL) (2.5 MG/3ML) 0.083% nebulizer solution Take 3 mLs (2.5 mg total) by nebulization every 6 (six) hours as needed for wheezing or shortness of breath. 05/24/2018: LF 06-26-17 UNK DS  . allopurinol (ZYLOPRIM) 100 MG tablet Take 2 tablets (200 mg total) by mouth daily.   [name] ALPRAZolam (XANAX) 0.5 MG tablet Take 1 tablet (0.5 mg total) by mouth daily as needed for anxiety.   [name] aspirin EC 81 MG tablet Take 81 mg by mouth daily.    . cetirizine (ZYRTEC) 10 MG tablet Take 1 tablet (10 mg total) by mouth daily.   [name] doxycycline (VIBRA-TABS) 100 MG tablet Take 1 tablet (100 mg total) by mouth 2 (two) times daily.   . ferrous sulfate 325 (65 FE) MG tablet TAKE 1 TABLET BY MOUTH TWICE DAILY WITH MEALS TO KEEP BLOOD COUNT UP (Patient taking differently: Take 325 mg by mouth 2 (two) times daily with a meal. ) 05/24/2018: LF 05-12-18 30DS  . furosemide (LASIX) 40 MG tablet Take 1 tablet (40 mg total) by mouth 2 (two) times daily.   . Guaifenesin 1200 MG TB12 Take 1 tablet  (1,200 mg total) by mouth 2 (two) times daily.   [name] latanoprost (XALATAN) 0.005 % ophthalmic solution Place one drop in both eyes at bedtime. 05/24/2018: LF 02-17-18 68DS  . metoprolol tartrate (LOPRESSOR) 25 MG tablet TAKE 1 TABLET BY MOUTH EVERY 6 HOURS AS NEEDED FOR HEART FLUTTERING 05/24/2018: LF 04-16-18 UNK DS  . nitroGLYCERIN (NITROSTAT) 0.4 MG SL tablet Place 1 tablet (0.4 mg total) under the tongue every 5 (five) minutes as needed for chest pain.   [name] Olopatadine HCl 0.2 % SOLN Place one to two drops in each once daily as needed for itching. 05/24/2018: LF 02-17-18 13DS  . polyethylene glycol (MIRALAX / GLYCOLAX) packet Take 17 g by mouth daily as needed for mild constipation.    . potassium chloride (K-DUR) 10 MEQ tablet Take 1 tablet (10 mEq total) by mouth daily. 05/24/2018: LF 04-30-18 90DS  . predniSONE (DELTASONE) 1 MG tablet Take 4 mg by mouth daily with breakfast. 05/24/2018: LF 04-30-18 30DS  . ranitidine (ZANTAC) 150 MG tablet Take 1 tablet (150 mg total) by mouth daily.   . rosuvastatin (CRESTOR) 20 MG tablet Take 1 tablet (20 mg total) by mouth daily.   [name] senna (SENOKOT) 8.6 MG TABS tablet Take 1 tablet by mouth daily as needed for mild constipation.   . Tiotropium Bromide Monohydrate (SPIRIVA RESPIMAT) 1.25 MCG/ACT AERS Inhale 2 puffs into  the lungs daily at 6 (six) AM.   . triamcinolone cream (KENALOG) 0.1 % Apply 1 application topically 2 (two) times daily.   . Vitamin D, Ergocalciferol, (DRISDOL) 50000 units CAPS capsule Take 50,000 Units by mouth See admin instructions. Take one capsule by mouth every 7 days for eight weeks, then take one capsule once a month. 05/24/2018: Original fill date 04-20-18 28DS, refilled 05-12-18 28DS, then patient switches to one capsule monthly.   No facility-administered encounter medications on file as of 06/08/2018.     Functional Status:   In your present state of health, do you have any difficulty performing the following activities: 06/08/2018 05/22/2018   Hearing? N N  Vision? N N  Difficulty concentrating or making decisions? Y Y  Comment "forgetful sometimes like names" at times  Walking or climbing stairs? Y N  Comment "I  get tired" -  Dressing or bathing? N N  Doing errands, shopping? N N  Comment son take her to the doctor office., she is still driving short distances. Facilities manager and eating ? N -  Using the Toilet? N -  In the past six months, have you accidently leaked urine? N -  Do you have problems with loss of bowel control? N -  Managing your Medications? N -  Managing your Finances? N -  Housekeeping or managing your Housekeeping? N -  Some recent data might be hidden    Fall/Depression Screening:    Fall Risk  06/08/2018 06/03/2018 03/26/2018  Falls in the past year? No No No  Number falls in past yr: - - -  Comment - - -  Injury with Fall? - - -  Risk for fall due to : - - -  Follow up - - -   PHQ 2/9 Scores 06/08/2018 06/03/2018 03/26/2018 03/17/2018 03/02/2018 01/19/2018 12/24/2017  PHQ - 2 Score 0 0 0 0 0 0 0    Assessment: Referral received 05/28/18 from hospital liaison. RNCM following for care management needs.  72 year old with history of COPD, tracheostomy. history of heart failure, generalized anxiety disorder, depression, chronic cough, polymyalgia rheumatic, HTN, CKD, pacemaker. Admitted 05/22/18-05/27/18 with COPD .  Home visit completed. Client daughter, [name], and a great granddaughter was home, but not in room during visit. Client reports feeling better since discharge. She reports home health nurse came to see her today. She reports that the humidifier continues to wet her trach gauze so she reports she does not use much. Client has room humidifier operating in room. She reports she brings up some mucous and states sometimes it is thicker than others. She denies any problems at this time.  Medications reviewed: Discrepancy noted: medication listed in chart Guiaifenisin 1231m twice a day. Client has been  taking a combination pill with Guiafenisin 400 mg and phenylephrine 147m RNCM informed client that she was not getting the correct amount of Guaifenesin and that she was getting an additional medicine (phenylephrine) that pill that is not prescribed. Client to go to speak with her pharmacist and take her after visit summary to get assistance in getting the prescribed amount of Guiaifenisin.  Plan: continue to follow. Home Visit next month.  [facility] Problem [facility]     Most Recent Value  Care Plan Problem One  at risk for readmission  Role Documenting the Problem One  Care Management [place] Problem One  Active  [facility]   client will not be  readmiited within the next 31 days.  THN Long Term Goal Start Date  06/01/18  Interventions for Problem One Long Term Goal  reviewed medications, provided [facility] calender organizer, encouraged client to call RNCM as needed, encouraged client to use 24 hour nurse advice line also as needed.  THN CM Short Term Goal #1   client will verbalize contact with the respiratory therapist within the next week.  THN CM Short Term Goal #1 Start Date  06/01/18  THN CM Short Term Goal #1 Met Date  06/08/18  THN CM Short Term Goal #2   client will attend follow up appointments as scheduled within the next 30 days.  THN CM Short Term Goal #2 Start Date  06/01/18  Interventions for Short Term Goal #2  upcoming appointments reviewed.  THN CM Short Term Goal #3  client will take medications as prescribed within the next 30 days.  THN CM Short Term Goal #3 Start Date  06/08/18  Interventions for Short Tern Goal #3  medications reviewed, informed client of the difference in plain guiafenisin and quiafenisin with phenylephrine, assisted client with marking calender for Vitamin D dosing.     Thea Silversmith, RN, MSN, [place] Coordinator Cell: [phone]

## 2018-06-09 ENCOUNTER — Other Ambulatory Visit: Payer: Self-pay

## 2018-06-09 NOTE — Patient Outreach (Signed)
[facility]) Care Management  06/09/2018  Anaija Wissink [birth year] [id number]  Subjective: "I'm ok now".  EMMI red call late yesterday afternoon: feeling worse overall, harder to breathe, coughing more yesterday, more mucus or thicker mucus, mucus change of color.  Assessment: RNCM followed up regarding EMMI call around 4 pm yesterday. RNCM Spoke with client who states that she is up and better now.   She reports that she went to her pharmacy and the pharmacist helped her to get the prescribed Guiafenesin, but states she is not able to swallow the pill because the pill is too big. She reports she cut the medication yesterday, but ask if she can cut or crush this medication. Client to follow up with her pharmacist regarding alternatives. She states she feels as if the medication is helping thin her secretions.  She reports she did not sleep well last night and that she is having a little thicker secretion that sometime has streaks of blood, but states this has been an ongoing problem prior to hospitalization. She also reports mucous has been yellow, but sometimes it is a little darker yellow.   She acknowledges some anxiety associated with her condition/breathing. Cient states that Dr.Wolicki manages her tracheostomy. She has an upcoming appointment with Dr. Erik Obey on July 2nd. RNCM placed call to Dr. Noreene Filbert office request a sooner appointment or if possible for someone to call client to discuss client's concerns. Mrs. Alden-Polk aware.  24 hour nurse advice line reinforced. Also reinforced and encouraged client to call RNCM as needed. RNCM also reinforce home health nurse is also resource to call as needed.  Plan: continue to follow. Home visit next month.  Thea Silversmith, RN, MSN, [place] Coordinator Cell: [phone]

## 2018-06-10 ENCOUNTER — Ambulatory Visit: Payer: Medicare Other

## 2018-06-10 DIAGNOSIS — Z930 Tracheostomy status: Secondary | ICD-10-CM | POA: Diagnosis not present

## 2018-06-10 DIAGNOSIS — J449 Chronic obstructive pulmonary disease, unspecified: Secondary | ICD-10-CM | POA: Diagnosis not present

## 2018-06-14 ENCOUNTER — Other Ambulatory Visit: Payer: Self-pay

## 2018-06-14 NOTE — Patient Outreach (Signed)
[facility]) Care Management  06/14/2018  [name] [birth year] [id number]   EMMI red flag: received notification, client does not know when her follow up appointment is scheduled. RNCM called and reviewed follow up appointments noted in Epic. RNCM reinforced she has a follow up appoinement scheduled with Dr. Mare Loan tomorrow and Primary care on July 11th.   Client expressed thought of going to rehabilitation and states she is going to talk with Dr. Warren Danes about it.  Plan: social work consult, follow up within 1-2 weeks.  Thea Silversmith, RN, MSN, [place] Coordinator Cell: [phone]

## 2018-06-15 DIAGNOSIS — Z430 Encounter for attention to tracheostomy: Secondary | ICD-10-CM | POA: Diagnosis not present

## 2018-06-16 DIAGNOSIS — I129 Hypertensive chronic kidney disease with stage 1 through stage 4 chronic kidney disease, or unspecified chronic kidney disease: Secondary | ICD-10-CM | POA: Diagnosis not present

## 2018-06-16 DIAGNOSIS — D631 Anemia in chronic kidney disease: Secondary | ICD-10-CM | POA: Diagnosis not present

## 2018-06-16 DIAGNOSIS — N184 Chronic kidney disease, stage 4 (severe): Secondary | ICD-10-CM | POA: Diagnosis not present

## 2018-06-16 DIAGNOSIS — N2581 Secondary hyperparathyroidism of renal origin: Secondary | ICD-10-CM | POA: Diagnosis not present

## 2018-06-16 DIAGNOSIS — H10413 Chronic giant papillary conjunctivitis, bilateral: Secondary | ICD-10-CM | POA: Diagnosis not present

## 2018-06-16 DIAGNOSIS — H40023 Open angle with borderline findings, high risk, bilateral: Secondary | ICD-10-CM | POA: Diagnosis not present

## 2018-06-16 DIAGNOSIS — H40053 Ocular hypertension, bilateral: Secondary | ICD-10-CM | POA: Diagnosis not present

## 2018-06-16 DIAGNOSIS — H35033 Hypertensive retinopathy, bilateral: Secondary | ICD-10-CM | POA: Diagnosis not present

## 2018-06-16 DIAGNOSIS — H25813 Combined forms of age-related cataract, bilateral: Secondary | ICD-10-CM | POA: Diagnosis not present

## 2018-06-21 ENCOUNTER — Encounter (HOSPITAL_COMMUNITY): Payer: Self-pay | Admitting: Emergency Medicine

## 2018-06-21 ENCOUNTER — Emergency Department (HOSPITAL_COMMUNITY): Payer: Medicare Other

## 2018-06-21 ENCOUNTER — Emergency Department (HOSPITAL_COMMUNITY)
Admission: EM | Admit: 2018-06-21 | Discharge: 2018-06-21 | Disposition: A | Discharge status: Home / Self Care | Payer: Medicare Other | Attending: Emergency Medicine | Admitting: Emergency Medicine

## 2018-06-21 DIAGNOSIS — J45909 Unspecified asthma, uncomplicated: Secondary | ICD-10-CM | POA: Diagnosis not present

## 2018-06-21 DIAGNOSIS — R0602 Shortness of breath: Secondary | ICD-10-CM | POA: Diagnosis not present

## 2018-06-21 DIAGNOSIS — N183 Chronic kidney disease, stage 3 (moderate): Secondary | ICD-10-CM | POA: Insufficient documentation

## 2018-06-21 DIAGNOSIS — I130 Hypertensive heart and chronic kidney disease with heart failure and stage 1 through stage 4 chronic kidney disease, or unspecified chronic kidney disease: Secondary | ICD-10-CM | POA: Diagnosis not present

## 2018-06-21 DIAGNOSIS — J449 Chronic obstructive pulmonary disease, unspecified: Secondary | ICD-10-CM | POA: Diagnosis not present

## 2018-06-21 DIAGNOSIS — Z7982 Long term (current) use of aspirin: Secondary | ICD-10-CM | POA: Insufficient documentation

## 2018-06-21 DIAGNOSIS — Z87891 Personal history of nicotine dependence: Secondary | ICD-10-CM | POA: Diagnosis not present

## 2018-06-21 DIAGNOSIS — F419 Anxiety disorder, unspecified: Secondary | ICD-10-CM | POA: Diagnosis not present

## 2018-06-21 DIAGNOSIS — Z79899 Other long term (current) drug therapy: Secondary | ICD-10-CM | POA: Insufficient documentation

## 2018-06-21 DIAGNOSIS — I252 Old myocardial infarction: Secondary | ICD-10-CM | POA: Insufficient documentation

## 2018-06-21 DIAGNOSIS — I5030 Unspecified diastolic (congestive) heart failure: Secondary | ICD-10-CM | POA: Diagnosis not present

## 2018-06-21 LAB — CBC
HEMATOCRIT: 29.7 % — AB (ref 36.0–46.0)
Hemoglobin: 8.4 g/dL — ABNORMAL LOW (ref 12.0–15.0)
MCH: 26.3 pg (ref 26.0–34.0)
MCHC: 28.3 g/dL — AB (ref 30.0–36.0)
MCV: 93.1 fL (ref 78.0–100.0)
Platelets: 218 10*3/uL (ref 150–400)
RBC: 3.19 MIL/uL — ABNORMAL LOW (ref 3.87–5.11)
RDW: 17.5 % — AB (ref 11.5–15.5)
WBC: 7.6 10*3/uL (ref 4.0–10.5)

## 2018-06-21 LAB — BASIC METABOLIC PANEL
ANION GAP: 10 (ref 5–15)
BUN: 50 mg/dL — ABNORMAL HIGH (ref 8–23)
CO2: 27 mmol/L (ref 22–32)
Calcium: 9.1 mg/dL (ref 8.9–10.3)
Chloride: 99 mmol/L (ref 98–111)
Creatinine, Ser: 1.64 mg/dL — ABNORMAL HIGH (ref 0.44–1.00)
GFR, EST AFRICAN AMERICAN: 34 mL/min — AB (ref >60)
GFR, EST NON AFRICAN AMERICAN: 30 mL/min — AB (ref >60)
Glucose, Bld: 186 mg/dL — ABNORMAL HIGH (ref 70–99)
POTASSIUM: 4.4 mmol/L (ref 3.5–5.1)
SODIUM: 136 mmol/L (ref 135–145)

## 2018-06-21 LAB — I-STAT TROPONIN, ED: Troponin i, poc: 0.11 ng/mL (ref 0.00–0.08)

## 2018-06-21 LAB — BRAIN NATRIURETIC PEPTIDE: B Natriuretic Peptide: 330.2 pg/mL — ABNORMAL HIGH (ref 0.0–100.0)

## 2018-06-21 MED ORDER — ALPRAZOLAM 0.25 MG PO TABS
0.5000 mg | ORAL_TABLET | Freq: Once | ORAL | Status: AC
  Administered 2018-06-21: 0.5 mg via ORAL
  Filled 2018-06-21: qty 2

## 2018-06-21 NOTE — ED Triage Notes (Signed)
Pt presents with difficulty breathing, cough, mucous production and problems with her trach that increasingly worsened today; pt has had the trach since 4237 and Dr. Erik Obey cares for her; pt states she has been taking mucomyst and thinning agents without improvement, lung sounds clear in triage

## 2018-06-21 NOTE — Discharge Instructions (Signed)
Seen today for shortness of breath.  Your work-up is reassuring.  Your trach appears in place and her chest x-ray is reassuring.  Follow-up closely with Dr. Erik Obey.  Make sure that you are suctioning appropriately at home.

## 2018-06-21 NOTE — ED Notes (Signed)
While pt was ambulating back from the bathroom, pt became extremely SOB (short of breath). Pt informed  that she needs to stay in bed.

## 2018-06-21 NOTE — ED Notes (Signed)
Dr. Laverta Baltimore, triage & NF notified of elevated trop

## 2018-06-21 NOTE — ED Notes (Signed)
Pt ambulated with steady gait to the bathroom.

## 2018-06-21 NOTE — Progress Notes (Signed)
RT note: RT bagged lavaged patients trach and suctioned no secretions. BLBs clear. Patient coughed post suction and moved small clear secretions. Vital sign stable at this time. MD aware.

## 2018-06-21 NOTE — ED Provider Notes (Signed)
[place] EMERGENCY DEPARTMENT Provider Note   CSN: [id number] Arrival date & time: 06/21/18  1458     History   Chief Complaint Chief Complaint  Patient presents with  . Respiratory Distress  . Trach problems  . Cough    HPI [name] is a 72 y.o. female.  HPI  This is a 72 year old female with a history of multiple medical problems including CHF, heart block, trach placement, chronic shortness of breath, anxiety who presents with shortness of breath.  Patient reports intermittent episodes of shortness of breath worsening over the last 3 weeks.  She was seen and evaluated 3 weeks ago for the same.  She states that since that time she saw Dr. Erik Obey, ENT, in the office.  At that time she was feeling well.  She has not had any recent downsizing of her trach.  Patient reports that she occasionally she has a productive cough and feels more short of breath because "something feels stuck."  She denies any fevers.  She has been suctioning at home.  She denies any chest pain, abdominal pain, lower extremity swelling.  Denies orthopnea.  Past Medical History:  Diagnosis Date  . Anemia   . Angina   . Angioedema    2/2 ACE  . Arteriovenous malformation of stomach   . Arthritis   . Asthma   . AVM (arteriovenous malformation) of colon    small intestine; stomach  . Blood transfusion   . CHF (congestive heart failure) ([place])   . Complete heart block ([place])    s/p PPM 1998  . Complication of anesthesia    Difficult airway; anaphylaxis and swelling with propofol  . DDD (degenerative disc disease)   . Depression   . Diastolic heart failure   . Fatty liver 07/26/10  . GERD (gastroesophageal reflux disease)   . GI bleed   . Headache   . History of alcohol abuse Stopped Fall 2012  . History of tobacco use Quit Fall 2012  . Hx of cardiovascular stress test    a. Lexiscan Myoview (10/15):  Small inferolateral and apical defect c/w scar and poss soft tissue  attenuation, no ischemia, EF 42%  . Hx of colonic polyp 08/13/10   adenomatous  . Hx of colonoscopy   . Hyperlipidemia   . Hyperlipidemia   . Hypertension   . Hypertrophic cardiomyopathy ([place])    dx by Dr [name] 2009  . Iron deficiency anemia   . Myocardial infarction ([place])   . Panic attack   . Panic attacks   . Permanent atrial fibrillation ([place])   . Pneumonia   . Renal failure    baseline creatinine 1.6  . Right arm pain 01/08/2012  . RLS (restless legs syndrome)    Dx 06/2007  . Shortness of breath    sob on exertation  . Sleep apnea     Patient Active Problem List   Diagnosis Date Noted  . Depression with anxiety   . Tracheostomy in place [facility])   . Tracheostomy care ([place])   . Shortness of breath 03/20/2018  . Calculus of gallbladder without cholecystitis without obstruction 12/24/2017  . Chronic cough 09/25/2017  . History of colonic polyps   . Polyp of cecum   . Polyp of ascending colon   . Polyp of transverse colon   . Polyp of descending colon   . Polyp of rectum   . Moderate protein-calorie malnutrition ([place]) 06/02/2017  . Chronic idiopathic constipation 03/19/2017  . Loss  of weight 01/26/2017  . 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 Permanent atrial fibrillation ([place])   . Laryngeal stenosis 09/18/2016  . Esophageal dysphagia   . COPD exacerbation ([place])   . Dyspnea on exertion   . Polymyalgia rheumatica ([place]) 04/10/2015  . Low back pain 03/28/2015  . Hypertrophic cardiomyopathy ([place]) 02/15/2015  . Eczema 09/18/2014  . Environmental allergies 09/18/2014  . Pre-diabetes 11/26/2013  . COPD, moderate ([place]) 11/01/2013  . Cough 07/15/2013  . Complete heart block ([place]) 04/03/2011  . Anemia 02/17/2011  . AVM (arteriovenous malformation) 02/13/2011  . PACEMAKER-[place] 11/28/2010  . CKD (chronic kidney disease) stage 3, GFR 30-59 ml/min (HCC) 09/24/2010  . Acute on chronic congestive heart failure ([place]) 08/30/2010  . ARTHRITIS 07/24/2010  . Generalized anxiety  disorder 07/23/2010  . Hyperlipidemia 02/11/2007  . Essential hypertension 02/11/2007  . APNEA, SLEEP 02/11/2007    Past Surgical History:  Procedure Laterality Date  . CARDIAC CATHETERIZATION    . CARDIAC CATHETERIZATION N/A 08/19/2016   Procedure: Right/Left Heart Cath and Coronary Angiography;  Surgeon: Jolaine Artist, MD;  Location: [place] CV LAB;  Service: Cardiovascular;  Laterality: N/A;  . COLONOSCOPY WITH PROPOFOL N/A 06/16/2017   Procedure: COLONOSCOPY WITH PROPOFOL;  Surgeon: Mauri Pole, MD;  Location: [facility] ENDOSCOPY;  Service: Endoscopy;  Laterality: N/A;  . DIRECT LARYNGOSCOPY N/A 09/18/2016   Procedure: DIRECT LARYNGOSCOPY, BRONCHOSCOPY, REMOVAL OF INTUBATION GRANULOMA;  Surgeon: [name], MD;  Location: [facility] OR;  Service: ENT;  Laterality: N/A;  . DIRECT LARYNGOSCOPY N/A 10/20/2016   Procedure: EXTUBATION AND FLEXIBLE LARYNGOSCOPE;  Surgeon: [name], MD;  Location: [place];  Service: ENT;  Laterality: N/A;  . DIRECT LARYNGOSCOPY N/A 10/29/2016   Procedure: DIRECT LARYNGOSCOPY;  Surgeon: [name], MD;  Location: [facility] OR;  Service: ENT;  Laterality: N/A;  . ESOPHAGOGASTRODUODENOSCOPY  12/23/2011   Procedure: ESOPHAGOGASTRODUODENOSCOPY (EGD);  Surgeon: [name], MD;  Location: [facility];  Service: Endoscopy;  Laterality: N/A;  . ESOPHAGOGASTRODUODENOSCOPY (EGD) WITH PROPOFOL N/A 06/16/2017   Procedure: ESOPHAGOGASTRODUODENOSCOPY (EGD) WITH PROPOFOL;  Surgeon: Mauri Pole, MD;  Location: [facility];  Service: Endoscopy;  Laterality: N/A;  . EXTUBATION (ENDOTRACHEAL) IN OR N/A 07/21/2016   Procedure: EXTUBATION (ENDOTRACHEAL) IN OR;  Surgeon: [name], MD;  Location: [place];  Service: ENT;  Laterality: N/A;  . GIVENS CAPSULE STUDY  12/23/2011   Procedure: GIVENS CAPSULE STUDY;  Surgeon: [name], MD;  Location: [facility]OS[facility];  Service: Endoscopy;  Laterality: N/A;  . MICROLARYNGOSCOPY WITH LASER N/A 10/16/2016   Procedure:  MICRODIRECTLARYNGOSCOPY WITH LASER ABLATION AND KENLOG INJECTION;  Surgeon: [name], MD;  Location: [place];  Service: ENT;  Laterality: N/A;  . PACEMAKER INSERTION  1998   st jude, most recent gen change by [name] 4/12  . TRACHEOSTOMY TUBE PLACEMENT N/A 10/29/2016   Procedure: TRACHEOSTOMY;  Surgeon: [name], MD;  Location: [place];  Service: ENT;  Laterality: N/A;  . TUBAL LIGATION  04/01/2000     OB History   None      Home Medications    Prior to Admission medications   Medication Sig Start Date End Date Taking? Authorizing Provider  acetaminophen (TYLENOL) 325 MG tablet Take 325-650 mg by mouth every 6 (six) hours as needed for mild pain, fever or headache.     [provider]  albuterol (PROVENTIL HFA;VENTOLIN HFA) 108 (90 Base) MCG/ACT inhaler Inhale 1-2 puffs into the lungs every 6 (six) hours as needed for wheezing or shortness of breath.  08/11/17   Lucila Maine C, DO  albuterol (PROVENTIL) (2.5 MG/3ML) 0.083% nebulizer solution Take 3 mLs (2.5 mg total) by nebulization every 6 (six) hours as needed for wheezing or shortness of breath. 06/26/17   Steve Rattler, DO  allopurinol (ZYLOPRIM) 100 MG tablet Take 2 tablets (200 mg total) by mouth daily. 06/03/18   Zenia Resides, MD  ALPRAZolam Duanne Moron) 0.5 MG tablet Take 1 tablet (0.5 mg total) by mouth daily as needed for anxiety. 06/03/18   Zenia Resides, MD  aspirin EC 81 MG tablet Take 81 mg by mouth daily.     [provider]  cetirizine (ZYRTEC) 10 MG tablet Take 1 tablet (10 mg total) by mouth daily. 06/03/18   Zenia Resides, MD  doxycycline (VIBRA-TABS) 100 MG tablet Take 1 tablet (100 mg total) by mouth 2 (two) times daily. 06/03/18   Zenia Resides, MD  ferrous sulfate 325 (65 FE) MG tablet TAKE 1 TABLET BY MOUTH TWICE DAILY WITH MEALS TO KEEP BLOOD COUNT UP Patient taking differently: Take 325 mg by mouth 2 (two) times daily with a meal.  08/26/16   Riccio, Gardiner Rhyme, DO  furosemide (LASIX) 40  MG tablet Take 1 tablet (40 mg total) by mouth 2 (two) times daily. 06/03/18   Zenia Resides, MD  Guaifenesin 1200 MG TB12 Take 1 tablet (1,200 mg total) by mouth 2 (two) times daily. 03/17/18   Steve Rattler, DO  latanoprost (XALATAN) 0.005 % ophthalmic solution Place one drop in both eyes at bedtime. 02/16/18   [provider]  metoprolol tartrate (LOPRESSOR) 25 MG tablet TAKE 1 TABLET BY MOUTH EVERY 6 HOURS AS NEEDED FOR HEART FLUTTERING 04/08/17   Allred, Jeneen Rinks, MD  nitroGLYCERIN (NITROSTAT) 0.4 MG SL tablet Place 1 tablet (0.4 mg total) under the tongue every 5 (five) minutes as needed for chest pain. 01/18/15   Eileen Stanford, PA-C  Olopatadine HCl 0.2 % SOLN Place one to two drops in each once daily as needed for itching. 02/17/18   [provider]  polyethylene glycol (MIRALAX / GLYCOLAX) packet Take 17 g by mouth daily as needed for mild constipation.     [provider]  potassium chloride (K-DUR) 10 MEQ tablet Take 1 tablet (10 mEq total) by mouth daily. 02/05/18   Steve Rattler, DO  predniSONE (DELTASONE) 1 MG tablet Take 4 mg by mouth daily with breakfast.    [provider]  ranitidine (ZANTAC) 150 MG tablet Take 1 tablet (150 mg total) by mouth daily. 03/28/18   Muthersbaugh, Jarrett Soho, PA-C  rosuvastatin (CRESTOR) 20 MG tablet Take 1 tablet (20 mg total) by mouth daily. 06/03/18   Zenia Resides, MD  senna (SENOKOT) 8.6 MG TABS tablet Take 1 tablet by mouth daily as needed for mild constipation.    [provider]  Tiotropium Bromide Monohydrate (SPIRIVA RESPIMAT) 1.25 MCG/ACT AERS Inhale 2 puffs into the lungs daily at 6 (six) AM. 05/26/18   Guadalupe Dawn, MD  triamcinolone cream (KENALOG) 0.1 % Apply 1 application topically 2 (two) times daily. 06/03/18   Zenia Resides, MD  Vitamin D, Ergocalciferol, (DRISDOL) 50000 units CAPS capsule Take 50,000 Units by mouth See admin instructions. Take one capsule by mouth every 7 days for eight  weeks, then take one capsule once a month.    [provider]    Family History Family History  Problem Relation Age of Onset  . Hypertension Mother   . Stroke  Mother   . Heart disease Mother   . Aneurysm Mother   . CVA Mother   . Hypertension Father   . Stroke Father   . Heart attack Father   . Alcohol abuse Father   . Hypertension Sister   . Hypertension Brother   . Colon cancer Brother   . Cancer Brother   . Diabetes Sister   . Diabetes Brother   . Heart attack Brother   . Heart attack Brother   . Heart disease Sister   . Alcohol abuse Brother   . Heart disease Daughter     Social History Social History   Tobacco Use  . Smoking status: Former Smoker    Packs/day: 0.10    Years: 50.00    Pack years: 5.00    Types: Cigarettes    Last attempt to quit: 08/16/2011    Years since quitting: 6.8  . Smokeless tobacco: Never Used  . Tobacco comment: quit 2012  Substance Use Topics  . Alcohol use: No    Comment: quit 08/16/2011  . Drug use: No     Allergies   Ace inhibitors and Other   Review of Systems Review of Systems  Constitutional: Negative for fever.  Respiratory: Positive for cough and shortness of breath.   Cardiovascular: Negative for chest pain.  Gastrointestinal: Negative for abdominal pain, nausea and vomiting.  Genitourinary: Negative for dysuria.  All other systems reviewed and are negative.    Physical Exam Updated Vital Signs BP (!) 150/102   Pulse 70   Temp 98.4 F (36.9 C) (Oral)   Resp 17   Ht 5\' 4"  (1.626 m)   Wt 78 kg (172 lb)   SpO2 98%   BMI 29.52 kg/m   Physical Exam  Constitutional: She is oriented to person, place, and time.  Chronically ill-appearing but nontoxic  HENT:  Head: Normocephalic and atraumatic.  Eyes: Pupils are equal, round, and reactive to light.  Neck: Neck supple.  4-0 trach in place with Passy-Muir valve  Cardiovascular: Normal rate, regular rhythm and normal heart sounds.  No murmur  heard. Pulmonary/Chest: Effort normal and breath sounds normal. No respiratory distress. She has no wheezes. She has no rales.  Palpated pacemaker  Abdominal: Soft. Bowel sounds are normal. There is no tenderness.  Musculoskeletal: She exhibits no edema.  Neurological: She is alert and oriented to person, place, and time.  Skin: Skin is warm and dry.  Psychiatric: She has a normal mood and affect.  Nursing note and vitals reviewed.    ED Treatments / Results  Labs (all labs ordered are listed, but only abnormal results are displayed) Labs Reviewed  BASIC METABOLIC PANEL - Abnormal; Notable for the following components:      Result Value   Glucose, Bld 186 (*)    BUN 50 (*)    Creatinine, Ser 1.64 (*)    GFR calc non Af Amer 30 (*)    GFR calc Af Amer 34 (*)    All other components within normal limits  CBC - Abnormal; Notable for the following components:   RBC 3.19 (*)    Hemoglobin 8.4 (*)    HCT 29.7 (*)    MCHC 28.3 (*)    RDW 17.5 (*)    All other components within normal limits  BRAIN NATRIURETIC PEPTIDE - Abnormal; Notable for the following components:   B Natriuretic Peptide 330.2 (*)    All other components within normal limits  I-STAT TROPONIN, ED - Abnormal;  Notable for the following components:   Troponin i, poc 0.11 (*)    All other components within normal limits    EKG EKG Interpretation  Date/Time:  Monday June 21 2018 15:05:12 EDT Ventricular Rate:  70 PR Interval:    QRS Duration: 206 QT Interval:  490 QTC Calculation: 529 R Axis:   -29 Text Interpretation:  Ventricular-paced rhythm Abnormal ECG Similar to last tracing.  Confirmed by Nanda Quinton [phone]) on 06/21/2018 3:43:12 PM   Radiology Dg Chest 2 View  Result Date: 06/21/2018 CLINICAL DATA:  Dyspnea. Shortness of breath. History of tracheostomy since 2017, CHF, pneumonia. EXAM: CHEST - 2 VIEW COMPARISON:  Chest radiograph May 22, 2018 FINDINGS: Stable cardiomegaly. Mildly calcified aortic  arch. Mild interstitial prominence similar to prior examination without pleural effusion or focal consolidation. No pneumothorax. Dual lead RIGHT cardiac pacemaker in situ. Tracheostomy tube tip projects 5.5 cm above the carina, unchanged. Soft tissue planes and included osseous structures are non suspicious. IMPRESSION: Stable cardiomegaly. Similar interstitial prominence most consistent with pulmonary edema. Aortic Atherosclerosis (ICD10-I70.0). Electronically Signed   By: Elon Alas M.D.   On: 06/21/2018 15:41    Procedures Procedures (including critical care time)  Medications Ordered in ED Medications  ALPRAZolam Duanne Moron) tablet 0.5 mg (0.5 mg Oral Given 06/21/18 1946)     Initial Impression / Assessment and Plan / ED Course  I have reviewed the triage vital signs and the nursing notes.  Pertinent labs & imaging results that were available during my care of the patient were reviewed by me and considered in my medical decision making (see chart for details).     Patient presents with shortness of breath.  She is overall nontoxic appearing.  Chronically ill-appearing.  She does have a tracheostomy.  This is cared for by ENT.  Her exam is largely reassuring including vital signs which are within normal limits.  SPO2 100% without any supplemental oxygen.  She is in no respiratory distress.  She is afebrile.  Her breath sounds are clear.  She does have a recent hospitalization for COPD; however, no signs of wheezing.  She denies orthopnea.  She does not appear clinically volume overloaded.  Her chest x-ray appears stable from prior.  No pneumonia.  No significant volume overload.  Son is at the bedside and endorses that many times these episodes appear to be related to anxiety.  Patient is requesting her Xanax.  She was prescribed Xanax at home and has not taken any today.  Patient was suctioned by respiratory therapy with minimal mucus production.  At this time, no obvious source of shortness  of breath.  Doubt CHF or COPD.  Recommend close follow-up with ENT and primary physician.  After history, exam, and medical workup I feel the patient has been appropriately medically screened and is safe for discharge home. Pertinent diagnoses were discussed with the patient. Patient was given return precautions.   Final Clinical Impressions(s) / ED Diagnoses   Final diagnoses:  SOB (shortness of breath)    ED Discharge Orders    None       Horton, Barbette Hair, MD 06/21/18 [phone]

## 2018-06-23 DIAGNOSIS — D631 Anemia in chronic kidney disease: Secondary | ICD-10-CM | POA: Diagnosis not present

## 2018-06-24 ENCOUNTER — Other Ambulatory Visit: Payer: Self-pay

## 2018-06-24 ENCOUNTER — Encounter: Payer: Self-pay | Admitting: Family Medicine

## 2018-06-24 ENCOUNTER — Ambulatory Visit (INDEPENDENT_AMBULATORY_CARE_PROVIDER_SITE_OTHER): Payer: Medicare Other | Admitting: Family Medicine

## 2018-06-24 DIAGNOSIS — F418 Other specified anxiety disorders: Secondary | ICD-10-CM | POA: Diagnosis not present

## 2018-06-24 DIAGNOSIS — J449 Chronic obstructive pulmonary disease, unspecified: Secondary | ICD-10-CM

## 2018-06-24 DIAGNOSIS — Z79899 Other long term (current) drug therapy: Secondary | ICD-10-CM | POA: Diagnosis not present

## 2018-06-24 MED ORDER — IPRATROPIUM-ALBUTEROL 0.5-2.5 (3) MG/3ML IN SOLN: 3.0000 mL | Freq: Four times a day (QID) | RESPIRATORY_TRACT | 12 refills | Status: DC

## 2018-06-24 MED ORDER — ALPRAZOLAM 0.5 MG PO TABS: 0.5000 mg | ORAL_TABLET | Freq: Every day | ORAL | 2 refills | Status: DC | PRN

## 2018-06-24 NOTE — Patient Outreach (Addendum)
[facility]) Care Management  06/24/2018  Gracie Gupta Oct 05, 1946 [id number]   72 year old with history of COPD, tracheostomy. history of heart failure, generalized anxiety disorder, depression, chronic cough, polymyalgia rheumatic, HTN, CKD, pacemaker. Admitted 05/22/18-05/27/18 with COPD exacerbation. Emergency room visit on 05/22/18 for shortness of breath.  RNCM called to follow up. Line is busy. RNCM noted follow up appointment today with primary care.  Plan: RNCM will follow up within 3-4 business days. Home visit previously for July 23rd.  Thea Silversmith, RN, MSN, [place] Coordinator Cell: [phone]

## 2018-06-24 NOTE — Patient Instructions (Addendum)
I am going to give you a new nebulizer drug, duoneb, that will take the place of the nebulizer you have and the spiriva.  I want you to give yourself a nebulizer treatment at least three times per day, but better would be four times a day.   See me in two weeks

## 2018-06-25 ENCOUNTER — Encounter: Payer: Self-pay | Admitting: Family Medicine

## 2018-06-25 DIAGNOSIS — Z79899 Other long term (current) drug therapy: Secondary | ICD-10-CM | POA: Insufficient documentation

## 2018-06-25 NOTE — Assessment & Plan Note (Signed)
Did the best I could to clear up.  I think she would benefit from a [facility] case manager.

## 2018-06-25 NOTE — Progress Notes (Signed)
   Subjective:    Patient ID: [name], female    DOB: October 10, 1946, 72 y.o.   MRN: [id number]  HPI [name] seems a bit overwhelmed by the complexity of there illness.  She has a host of medications and several after visit summaries and is trying hard to follow medical directions.  Specific issues: 1. Pulmonary/COPD.  She complains that the spiriva is both expensive and infective.  She is also puzzled by the mechanism and not sure she is doing it correctly.  She feels much better with the albuterol inhaler.  Note that she also has a nebulizer at home.   2. Anxiety:  Requests a refill on her alprazolam.   3. Polypharmacy: I did a thorough med review with the patient and we sorted out her medications while I tried to insure that her med list was correct.   4. At risk for rehosptialization.  States she almost went to ER twice because of SOB and feeling very anxious.    Review of Systems     Objective:   Physical Exam Gen: LOL in NAD Lungs mild end exp wheeze and prolonged exp phase Cardiac RRR without m or g Ext no edeam  Had her demonstrate inhaler use.  When she uses MDI she is too short of breath to cover her tach.  It seems that little or non of her oral inhalor is getting into the lungs.  Greater than 50% of visit was counseling and duration of visit was 25 minutes.       Assessment & Plan:

## 2018-06-25 NOTE — Assessment & Plan Note (Signed)
Refilled her alprazolam.  I am certain that her anxiety is contributing to her SOB.

## 2018-06-25 NOTE — Assessment & Plan Note (Signed)
DC spiriva.  Not surprising that it is ineffective with trach.  I prescribed regularly dosed duonebs as a LAMA/LABA equivalent.

## 2018-06-30 ENCOUNTER — Other Ambulatory Visit: Payer: Self-pay

## 2018-06-30 NOTE — Patient Outreach (Signed)
[facility]) Care Management  06/30/2018  Fallan Mccarey 1946-05-17 [id number]  Care coordination: RNCM received voice message from Dr. Andria Frames requesting assistance for medication management: Client was seen at provider office last week and brought in bags of medications with multiple list of after visit summaries. Request to assist client with the management and organization of her medications and dispose of non-pertinent medications and help with organizing medications as client seemed very overwhelmed by all her medications.   Plan: RNCN will follow up with client, [facility] pharmacy referral completed.  Thea Silversmith, RN, MSN, [place] Coordinator Cell: [phone]

## 2018-06-30 NOTE — Patient Outreach (Signed)
[facility]) Care Management  06/30/2018  Naiyah Klostermann 24-Apr-1946 [id number]  72 year old with history of COPD, tracheostomy. history of heart failure, generalized anxiety disorder, depression, chronic cough, polymyalgia rheumatic, HTN, CKD, pacemaker. Admitted 05/22/18-05/27/18 with COPD exacerbation. Emergency room visit on 05/22/18 for shortness of breath.  RNCM called to follow up. Client is without questions at this time. RNCM discussed last appointment with primary care and informed client of recommendation for [facility] pharmacy assistance. Client is in agreement. RNCM asked about client's daughter, she reports daughter is out of town, but states will be back in a week or two.  Upcoming Appointments: Dr. Lorin Mercy July 23 Leafy Kindle PA-C orthopedic nephrologist PA  Plan: continue to follow. Home visit scheduled for next week.  Thea Silversmith, RN, MSN, [place] Coordinator Cell: [phone]

## 2018-07-01 ENCOUNTER — Telehealth: Payer: Self-pay | Admitting: Pharmacist

## 2018-07-01 NOTE — Patient Outreach (Signed)
[facility]) Care Management  07/01/2018  Pasha Gadison [birth year] [id number]   Patient was called regarding medication management and assistance. HIPAA identifiers were obtained.  Patient is a 72 year old female with history of COPD, tracheostomy. history of heart failure, generalized anxiety disorder, depression, chronic cough, polymyalgia rheumatic, HTN, CKD, pacemaker. Admitted 05/22/18-05/27/18 with COPD exacerbation and visited the emergency room visit on 05/22/18 for shortness of breath.  The referral for the patient stated she took "bags of medications" to her provider's visit and needed help with medication management.    As such, a home visit was scheduled for Monday July 05, 2018 at 1:00pm   Elayne Guerin, PharmD, [place] Clinical Pharmacist [phone]

## 2018-07-05 ENCOUNTER — Other Ambulatory Visit: Payer: Self-pay | Admitting: Pharmacist

## 2018-07-05 ENCOUNTER — Telehealth: Payer: Self-pay | Admitting: *Deleted

## 2018-07-05 NOTE — Patient Outreach (Signed)
[name]) Care Management  [place]   07/05/2018  [name] 26-Jul-1946 [id number]  Subjective: Home visit completed at patient's home. HIPAA identifiers were obtained. Patient is a 72 year old history of COPD, tracheostomy. history of heart failure, generalized anxiety disorder, depression, chronic cough, polymyalgia rheumatica, HTN, CKD, pacemaker. Admitted 05/22/18-05/27/18 with COPD exacerbation. Emergency room visit on 06/21/18 for shortness of breath.  Patient said she was previously confused about her medications but [name] separated her medications for her at her last visit and she reported feeling "better" about her medications.  Objective:   Encounter Medications: Outpatient Encounter Medications as of 07/05/2018  Medication Sig Note  . allopurinol (ZYLOPRIM) 100 MG tablet Take 2 tablets (200 mg total) by mouth daily.   [name] ALPRAZolam (XANAX) 0.5 MG tablet Take 1 tablet (0.5 mg total) by mouth daily as needed for anxiety.   [name] aspirin EC 81 MG tablet Take 81 mg by mouth daily.    . ferrous sulfate 325 (65 FE) MG tablet TAKE 1 TABLET BY MOUTH TWICE DAILY WITH MEALS TO KEEP BLOOD COUNT UP (Patient taking differently: Take 325 mg by mouth 2 (two) times daily with a meal. ) 05/24/2018: LF 05-12-18 30DS  . furosemide (LASIX) 40 MG tablet Take 1 tablet (40 mg total) by mouth 2 (two) times daily.   . Guaifenesin 1200 MG TB12 Take 1 tablet (1,200 mg total) by mouth 2 (two) times daily.   [name] ipratropium-albuterol (DUONEB) 0.5-2.5 (3) MG/3ML SOLN Take 3 mLs by nebulization every 6 (six) hours.   [name] latanoprost (XALATAN) 0.005 % ophthalmic solution Place one drop in both eyes at bedtime. 05/24/2018: LF 02-17-18 68DS  . metoprolol tartrate (LOPRESSOR) 25 MG tablet TAKE 1 TABLET BY MOUTH EVERY 6 HOURS AS NEEDED FOR HEART FLUTTERING 07/05/2018: Takes PRN  . nitroGLYCERIN (NITROSTAT) 0.4 MG SL tablet Place 1 tablet (0.4 mg total) under the tongue every 5 (five) minutes  as needed for chest pain.   [name] Olopatadine HCl 0.2 % SOLN Place one to two drops in each once daily as needed for itching. 05/24/2018: LF 02-17-18 13DS  . polyethylene glycol (MIRALAX / GLYCOLAX) packet Take 17 g by mouth daily as needed for mild constipation.    . potassium chloride (K-DUR) 10 MEQ tablet Take 1 tablet (10 mEq total) by mouth daily. 05/24/2018: LF 04-30-18 90DS  . predniSONE (DELTASONE) 1 MG tablet Take 4 mg by mouth daily with breakfast. 05/24/2018: LF 04-30-18 30DS  . ranitidine (ZANTAC) 150 MG tablet Take 1 tablet (150 mg total) by mouth daily.   . rosuvastatin (CRESTOR) 20 MG tablet Take 1 tablet (20 mg total) by mouth daily.   [name] senna (SENOKOT) 8.6 MG TABS tablet Take 1 tablet by mouth daily as needed for mild constipation.   . Vitamin D, Ergocalciferol, (DRISDOL) 50000 units CAPS capsule Take 50,000 Units by mouth every 30 (thirty) days. Take one capsule by mouth every 7 days for eight weeks, then take one capsule once a month.    [name] acetaminophen (TYLENOL) 325 MG tablet Take 325-650 mg by mouth every 6 (six) hours as needed for mild pain, fever or headache.    . albuterol (PROVENTIL HFA;VENTOLIN HFA) 108 (90 Base) MCG/ACT inhaler Inhale 1-2 puffs into the lungs every 6 (six) hours as needed for wheezing or shortness of breath. 05/24/2018: LF 02-25-18 25DS  . albuterol (PROVENTIL) (2.5 MG/3ML) 0.083% nebulizer solution Take 3 mLs (2.5 mg total) by nebulization every 6 (six) hours as needed  for wheezing or shortness of breath. 05/24/2018: LF 06-26-17 UNK DS  . cetirizine (ZYRTEC) 10 MG tablet Take 1 tablet (10 mg total) by mouth daily.   [name] triamcinolone cream (KENALOG) 0.1 % Apply 1 application topically 2 (two) times daily.   . [DISCONTINUED] doxycycline (VIBRA-TABS) 100 MG tablet Take 1 tablet (100 mg total) by mouth 2 (two) times daily. (Patient not taking: Reported on 07/05/2018)    No facility-administered encounter medications on file as of 07/05/2018.     Functional Status: In your  present state of health, do you have any difficulty performing the following activities: 06/08/2018 05/22/2018  Hearing? N N  Vision? N N  Difficulty concentrating or making decisions? Y Y  Comment "forgetful sometimes like names" at times  Walking or climbing stairs? Y N  Comment "I  get tired" -  Dressing or bathing? N N  Doing errands, shopping? N N  Comment son take her to the doctor office., she is still driving short distances. Facilities manager and eating ? N -  Using the Toilet? N -  In the past six months, have you accidently leaked urine? N -  Do you have problems with loss of bowel control? N -  Managing your Medications? N -  Managing your Finances? N -  Housekeeping or managing your Housekeeping? N -  Some recent data might be hidden    Fall/Depression Screening: Fall Risk  06/24/2018 06/08/2018 06/03/2018  Falls in the past year? No No No  Number falls in past yr: - - -  Comment - - -  Injury with Fall? - - -  Risk for fall due to : - - -  Follow up - - -   PHQ 2/9 Scores 06/24/2018 06/08/2018 06/03/2018 03/26/2018 03/17/2018 03/02/2018 01/19/2018  PHQ - 2 Score 0 0 0 0 0 0 0      Assessment: Patient's medications were reviewed in her home.   Patient was recently discharged from hospital and all medications have been reviewed.   Drugs sorted by system:  Neurologic/Psychologic: Alprazolam  Cardiovascular: Furosemide Metoprolol Nitroglycerin Aspirin Rosuvastatin  Pulmonary/Allergy: ProAir Ipratropium/Albuterol  Gastrointestinal: Ranitidine Potassium Polyethylene Glycol Sennosides  Topical: Triamcinolone  Pain: Acetaminophen Prednisone   Vitamins/Minerals: Ferrous Sulfate Ergocalciferol Potassium  Miscellaneous: Allopurinol Olopatadine Latanoprost   Medications to avoid in the elderly:  Alprazolam   Other issues noted:  Dose discrepancy-prednisone. Med list instructions state: Prednisone 1mg  4 tablets daily. Patient had both prednisone  5mg  and 1 mg tablets in her possession and reported taking 9 mg daily.  Dose clarification- metoprolol--med list states 1 tablet every 6 hours prn heart fluttering  Patient had several old medication lists and after visit summaries. She was encouraged to use the most recent medication list as the source of truth.  Patient does not use a pill box and said they get her confused. After spending > 2 hours with the patient, some learning deficiencies were observed. Cognitively, a pill box would not be a good idea.  Pill packing MIGHT be an option but great care and concern would have to be taken to be sure the patient knows how to use the packs and that all providers understand medication changes should be handled through the pharmacy providing the packs.  Medication Assistance -patient has both Medicare Part D and Medicaid -As such, she does not qualify for medication assistance.   Plan: -called Dr. Osvaldo Angst for refill on Nitroglycerin and Ranitidine -called Dr. Adair Laundry office -[facility]  Care to inquire about their OTC program -Called [facility] to reorder:  Ranitidine 150mg , nitrostat (it was expired), and Prednisone 1mg  -Route note to PCP -Follow up with patient in 3-5 business days.   Elayne Guerin, PharmD, [place] Clinical Pharmacist [phone]

## 2018-07-05 NOTE — Telephone Encounter (Signed)
That's accurate. Thanks!

## 2018-07-05 NOTE — Telephone Encounter (Signed)
[name] visited pt today (see documentation).  Pt told her she was taking 9mg  of prednisone/day which is not what her current med list says.  She wanted to inform PCP in case she wanted to taper or change pts meds. Johan Antonacci, Salome Spotted, CMA

## 2018-07-06 ENCOUNTER — Encounter (INDEPENDENT_AMBULATORY_CARE_PROVIDER_SITE_OTHER): Payer: Self-pay | Admitting: Orthopaedic Surgery

## 2018-07-06 ENCOUNTER — Ambulatory Visit (INDEPENDENT_AMBULATORY_CARE_PROVIDER_SITE_OTHER): Payer: Medicare Other | Admitting: Orthopaedic Surgery

## 2018-07-06 ENCOUNTER — Ambulatory Visit: Payer: Medicare Other

## 2018-07-06 ENCOUNTER — Encounter (INDEPENDENT_AMBULATORY_CARE_PROVIDER_SITE_OTHER): Payer: Self-pay

## 2018-07-06 ENCOUNTER — Ambulatory Visit (INDEPENDENT_AMBULATORY_CARE_PROVIDER_SITE_OTHER): Payer: Medicare Other

## 2018-07-06 VITALS — BP 129/82 | HR 70 | Ht 66.0 in | Wt 170.0 lb

## 2018-07-06 DIAGNOSIS — M87052 Idiopathic aseptic necrosis of left femur: Secondary | ICD-10-CM | POA: Diagnosis not present

## 2018-07-06 NOTE — Progress Notes (Signed)
Office Visit Note   Patient: [name]           Date of Birth: 1946-06-04           MRN: [id number] Visit Date: 07/06/2018              Requested by: Steve Rattler, DO [place], [place] [address] PCP: Steve Rattler, DO   Assessment & Plan: Visit Diagnoses:  1. Avascular necrosis of bone of left hip (HCC)     Plan: Have re-vascularized the portion and she has not had any collapse and now currently is not having any hip pain she can return if she has increased symptoms.  Follow-Up Instructions: No follow-ups on file.   Orders:  Orders Placed This Encounter  Procedures  . XR Pelvis 1-2 Views   No orders of the defined types were placed in this encounter.     Procedures: No procedures performed   Clinical Data: No additional findings.   Subjective: Chief Complaint  Patient presents with  . Left Hip - Pain, Follow-up    HPI 72 year old female returns.  She had a recent visit to the ER last week due to some shortness of breath.  She states phlegm is been a little bit better she has a tracheostomy.  Patient states her hips are doing much better she has a cane but has not had to use it.  She has been amatory in the community without groin pain.  Review of Systems  She been on long-term prednisone, COPD moderate, generalized anxiety disorder, complete heart block with pacemaker.  Hypertrophic cardiomyopathy.  Avascular necrosis left hip.      Objective: Vital Signs: BP 129/82   Pulse 70   Ht 5\' 6"  (1.676 m)   Wt 170 lb (77.1 kg)   BMI 27.44 kg/m   Physical Exam  Constitutional: She is oriented to person, place, and time. She appears well-developed.  HENT:  Head: Normocephalic.  Right Ear: External ear normal.  Left Ear: External ear normal.  Eyes: Pupils are equal, round, and reactive to light.  Neck: No tracheal deviation present. No thyromegaly present.  Tracheostomy.  Cardiovascular: Normal rate.  Pulmonary/Chest: Effort  normal.  Abdominal: Soft.  Neurological: She is alert and oriented to person, place, and time.  Skin: Skin is warm and dry.  Psychiatric: She has a normal mood and affect. Her behavior is normal.    Ortho Exam Patient ambulates with negative Trendelenburg gait logroll the hips are negative knee reach full extension.  No hip flexion contracture. Specialty Comments:  No specialty comments available.  Imaging: No results found.   PMFS History: Patient Active Problem List   Diagnosis Date Noted  . Polypharmacy 06/25/2018  . Depression with anxiety   . Tracheostomy in place [facility])   . Tracheostomy care ([place])   . Shortness of breath 03/20/2018  . Calculus of gallbladder without cholecystitis without obstruction 12/24/2017  . Chronic cough 09/25/2017  . History of colonic polyps   . Polyp of cecum   . Polyp of ascending colon   . Polyp of transverse colon   . Polyp of descending colon   . Polyp of rectum   . Moderate protein-calorie malnutrition ([place]) 06/02/2017  . Chronic idiopathic constipation 03/19/2017  . Loss of weight 01/26/2017  . 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 Permanent atrial fibrillation ([place])   . Laryngeal stenosis 09/18/2016  . Esophageal dysphagia   . COPD exacerbation ([place])   . Dyspnea on  exertion   . Polymyalgia rheumatica ([place]) 04/10/2015  . Low back pain 03/28/2015  . Hypertrophic cardiomyopathy ([place]) 02/15/2015  . Eczema 09/18/2014  . Environmental allergies 09/18/2014  . Pre-diabetes 11/26/2013  . COPD, moderate ([place]) 11/01/2013  . Cough 07/15/2013  . Complete heart block ([place]) 04/03/2011  . Anemia 02/17/2011  . AVM (arteriovenous malformation) 02/13/2011  . PACEMAKER-[facility] 11/28/2010  . CKD (chronic kidney disease) stage 3, GFR 30-59 ml/min (HCC) 09/24/2010  . Acute on chronic congestive heart failure ([place]) 08/30/2010  . ARTHRITIS 07/24/2010  . Generalized anxiety disorder 07/23/2010  . Hyperlipidemia 02/11/2007  . Essential hypertension  02/11/2007  . APNEA, SLEEP 02/11/2007   Past Medical History:  Diagnosis Date  . Anemia   . Angina   . Angioedema    2/2 ACE  . Arteriovenous malformation of stomach   . Arthritis   . Asthma   . AVM (arteriovenous malformation) of colon    small intestine; stomach  . Blood transfusion   . CHF (congestive heart failure) ([place])   . Complete heart block ([place])    s/p PPM 1998  . Complication of anesthesia    Difficult airway; anaphylaxis and swelling with propofol  . DDD (degenerative disc disease)   . Depression   . Diastolic heart failure   . Fatty liver 07/26/10  . GERD (gastroesophageal reflux disease)   . GI bleed   . Headache   . History of alcohol abuse Stopped Fall 2012  . History of tobacco use Quit Fall 2012  . Hx of cardiovascular stress test    a. Lexiscan Myoview (10/15):  Small inferolateral and apical defect c/w scar and poss soft tissue attenuation, no ischemia, EF 42%  . Hx of colonic polyp 08/13/10   adenomatous  . Hx of colonoscopy   . Hyperlipidemia   . Hyperlipidemia   . Hypertension   . Hypertrophic cardiomyopathy ([place])    dx by Dr [name] 2009  . Iron deficiency anemia   . Myocardial infarction ([place])   . Panic attack   . Panic attacks   . Permanent atrial fibrillation ([place])   . Pneumonia   . Renal failure    baseline creatinine 1.6  . Right arm pain 01/08/2012  . RLS (restless legs syndrome)    Dx 06/2007  . Shortness of breath    sob on exertation  . Sleep apnea     Family History  Problem Relation Age of Onset  . Hypertension Mother   . Stroke Mother   . Heart disease Mother   . Aneurysm Mother   . CVA Mother   . Hypertension Father   . Stroke Father   . Heart attack Father   . Alcohol abuse Father   . Hypertension Sister   . Hypertension Brother   . Colon cancer Brother   . Cancer Brother   . Diabetes Sister   . Diabetes Brother   . Heart attack Brother   . Heart attack Brother   . Heart disease Sister   . Alcohol abuse  Brother   . Heart disease Daughter     Past Surgical History:  Procedure Laterality Date  . CARDIAC CATHETERIZATION    . CARDIAC CATHETERIZATION N/A 08/19/2016   Procedure: Right/Left Heart Cath and Coronary Angiography;  Surgeon: Jolaine Artist, MD;  Location: [place] CV LAB;  Service: Cardiovascular;  Laterality: N/A;  . COLONOSCOPY WITH PROPOFOL N/A 06/16/2017   Procedure: COLONOSCOPY WITH PROPOFOL;  Surgeon: Mauri Pole, MD;  Location: [facility];  Service: Endoscopy;  Laterality: N/A;  . DIRECT LARYNGOSCOPY N/A 09/18/2016   Procedure: DIRECT LARYNGOSCOPY, BRONCHOSCOPY, REMOVAL OF INTUBATION GRANULOMA;  Surgeon: [name], MD;  Location: [facility] OR;  Service: ENT;  Laterality: N/A;  . DIRECT LARYNGOSCOPY N/A 10/20/2016   Procedure: EXTUBATION AND FLEXIBLE LARYNGOSCOPE;  Surgeon: [name], MD;  Location: [place];  Service: ENT;  Laterality: N/A;  . DIRECT LARYNGOSCOPY N/A 10/29/2016   Procedure: DIRECT LARYNGOSCOPY;  Surgeon: [name], MD;  Location: [facility] OR;  Service: ENT;  Laterality: N/A;  . ESOPHAGOGASTRODUODENOSCOPY  12/23/2011   Procedure: ESOPHAGOGASTRODUODENOSCOPY (EGD);  Surgeon: [name], MD;  Location: [facility];  Service: Endoscopy;  Laterality: N/A;  . ESOPHAGOGASTRODUODENOSCOPY (EGD) WITH PROPOFOL N/A 06/16/2017   Procedure: ESOPHAGOGASTRODUODENOSCOPY (EGD) WITH PROPOFOL;  Surgeon: Mauri Pole, MD;  Location: [facility] ENDOSCOPY;  Service: Endoscopy;  Laterality: N/A;  . EXTUBATION (ENDOTRACHEAL) IN OR N/A 07/21/2016   Procedure: EXTUBATION (ENDOTRACHEAL) IN OR;  Surgeon: [name], MD;  Location: [place];  Service: ENT;  Laterality: N/A;  . GIVENS CAPSULE STUDY  12/23/2011   Procedure: GIVENS CAPSULE STUDY;  Surgeon: [name], MD;  Location: [facility] ENDOSCOPY;  Service: Endoscopy;  Laterality: N/A;  . MICROLARYNGOSCOPY WITH LASER N/A 10/16/2016   Procedure: MICRODIRECTLARYNGOSCOPY WITH LASER ABLATION AND KENLOG INJECTION;  Surgeon: [name], MD;   Location: [place];  Service: ENT;  Laterality: N/A;  . PACEMAKER INSERTION  1998   st jude, most recent gen change by [name] 4/12  . TRACHEOSTOMY TUBE PLACEMENT N/A 10/29/2016   Procedure: TRACHEOSTOMY;  Surgeon: [name], MD;  Location: [place];  Service: ENT;  Laterality: N/A;  . TUBAL LIGATION  04/01/2000   Social History   Occupational History  . Occupation: disabled    Fish farm manager: UNEMPLOYED  . Occupation: previously- Development worker, community  Tobacco Use  . Smoking status: Former Smoker    Packs/day: 0.10    Years: 50.00    Pack years: 5.00    Types: Cigarettes    Last attempt to quit: 08/16/2011    Years since quitting: 6.8  . Smokeless tobacco: Never Used  . Tobacco comment: quit 2012  Substance and Sexual Activity  . Alcohol use: No    Comment: quit 08/16/2011  . Drug use: No  . Sexual activity: Never

## 2018-07-09 DIAGNOSIS — R5382 Chronic fatigue, unspecified: Secondary | ICD-10-CM | POA: Diagnosis not present

## 2018-07-09 DIAGNOSIS — R05 Cough: Secondary | ICD-10-CM | POA: Diagnosis not present

## 2018-07-09 DIAGNOSIS — M2550 Pain in unspecified joint: Secondary | ICD-10-CM | POA: Diagnosis not present

## 2018-07-09 DIAGNOSIS — M1A09X0 Idiopathic chronic gout, multiple sites, without tophus (tophi): Secondary | ICD-10-CM | POA: Diagnosis not present

## 2018-07-09 DIAGNOSIS — M353 Polymyalgia rheumatica: Secondary | ICD-10-CM | POA: Diagnosis not present

## 2018-07-12 ENCOUNTER — Other Ambulatory Visit: Payer: Self-pay | Admitting: Family Medicine

## 2018-07-12 ENCOUNTER — Other Ambulatory Visit: Payer: Self-pay

## 2018-07-12 NOTE — Patient Outreach (Signed)
[facility]) Care Management   07/12/2018  [name] September 30, 1946 [id number]  [name] is an 72 y.o. female  Subjective: reports she feels better, "coming and going".  Objective:  BP 118/76   Pulse 75   Resp 20   Wt 170 lb (77.1 kg) Comment: patient reported.  SpO2 97%   BMI 27.44 kg/m   Review of Systems  Respiratory: Positive for cough. Negative for sputum production, shortness of breath and wheezing.        Decreased breath sounds. Trach in place with covering scarf.  Cardiovascular:       S1S2 noted.    Physical Exam skin warm dry, color within normal limits.  Encounter Medications:   Outpatient Encounter Medications as of 07/12/2018  Medication Sig Note  . acetaminophen (TYLENOL) 325 MG tablet Take 325-650 mg by mouth every 6 (six) hours as needed for mild pain, fever or headache.    . albuterol (PROVENTIL HFA;VENTOLIN HFA) 108 (90 Base) MCG/ACT inhaler Inhale 1-2 puffs into the lungs every 6 (six) hours as needed for wheezing or shortness of breath. 05/24/2018: LF 02-25-18 25DS  . albuterol (PROVENTIL) (2.5 MG/3ML) 0.083% nebulizer solution Take 3 mLs (2.5 mg total) by nebulization every 6 (six) hours as needed for wheezing or shortness of breath. (Patient not taking: Reported on 07/05/2018) 07/05/2018: Expired  . allopurinol (ZYLOPRIM) 100 MG tablet Take 2 tablets (200 mg total) by mouth daily.   [name] ALPRAZolam (XANAX) 0.5 MG tablet Take 1 tablet (0.5 mg total) by mouth daily as needed for anxiety.   [name] aspirin EC 81 MG tablet Take 81 mg by mouth daily.    . cetirizine (ZYRTEC) 10 MG tablet Take 10 mg by mouth daily.   . ferrous sulfate 325 (65 FE) MG tablet TAKE 1 TABLET BY MOUTH TWICE DAILY WITH MEALS TO KEEP BLOOD COUNT UP (Patient taking differently: Take 325 mg by mouth 2 (two) times daily with a meal. ) 05/24/2018: LF 05-12-18 30DS  . furosemide (LASIX) 40 MG tablet Take 1 tablet (40 mg total) by mouth 2 (two) times daily.   .  Guaifenesin 1200 MG TB12 Take 1 tablet (1,200 mg total) by mouth 2 (two) times daily.   [name] ipratropium-albuterol (DUONEB) 0.5-2.5 (3) MG/3ML SOLN Take 3 mLs by nebulization every 6 (six) hours.   [name] latanoprost (XALATAN) 0.005 % ophthalmic solution Place one drop in both eyes at bedtime. 05/24/2018: LF 02-17-18 68DS  . magnesium hydroxide (MILK OF MAGNESIA) 400 MG/5ML suspension Take 60 mLs by mouth once as needed for mild constipation.   . metoprolol tartrate (LOPRESSOR) 25 MG tablet TAKE 1 TABLET BY MOUTH EVERY 6 HOURS AS NEEDED FOR HEART FLUTTERING 07/05/2018: Takes PRN  . nitroGLYCERIN (NITROSTAT) 0.4 MG SL tablet Place 1 tablet (0.4 mg total) under the tongue every 5 (five) minutes as needed for chest pain.   [name] Olopatadine HCl 0.2 % SOLN Place one to two drops in each once daily as needed for itching. 05/24/2018: LF 02-17-18 13DS  . polyethylene glycol (MIRALAX / GLYCOLAX) packet Take 17 g by mouth daily as needed for mild constipation.    . potassium chloride (K-DUR) 10 MEQ tablet Take 1 tablet (10 mEq total) by mouth daily. 05/24/2018: LF 04-30-18 90DS  . predniSONE (DELTASONE) 1 MG tablet Take 4 mg by mouth daily with breakfast. 05/24/2018: LF 04-30-18 30DS  . ranitidine (ZANTAC) 150 MG tablet Take 1 tablet (150 mg total) by mouth daily.   . rosuvastatin (CRESTOR) 20 MG tablet  Take 1 tablet (20 mg total) by mouth daily.   [name] senna (SENOKOT) 8.6 MG TABS tablet Take 1 tablet by mouth daily as needed for mild constipation.   . sodium chloride (OCEAN) 0.65 % SOLN nasal spray Place 1 spray into both nostrils as needed for congestion.   . triamcinolone cream (KENALOG) 0.1 % Apply 1 application topically 2 (two) times daily.   . Vitamin D, Ergocalciferol, (DRISDOL) 50000 units CAPS capsule Take 50,000 Units by mouth every 30 (thirty) days. Take one capsule by mouth every 7 days for eight weeks, then take one capsule once a month.     No facility-administered encounter medications on file as of 07/12/2018.      Functional Status:   In your present state of health, do you have any difficulty performing the following activities: 06/08/2018 05/22/2018  Hearing? N N  Vision? N N  Difficulty concentrating or making decisions? Y Y  Comment "forgetful sometimes like names" at times  Walking or climbing stairs? Y N  Comment "I  get tired" -  Dressing or bathing? N N  Doing errands, shopping? N N  Comment son take her to the doctor office., she is still driving short distances. Facilities manager and eating ? N -  Using the Toilet? N -  In the past six months, have you accidently leaked urine? N -  Do you have problems with loss of bowel control? N -  Managing your Medications? N -  Managing your Finances? N -  Housekeeping or managing your Housekeeping? N -  Some recent data might be hidden    Fall/Depression Screening:    Fall Risk  06/24/2018 06/08/2018 06/03/2018  Falls in the past year? No No No  Number falls in past yr: - - -  Comment - - -  Injury with Fall? - - -  Risk for fall due to : - - -  Follow up - - -   PHQ 2/9 Scores 06/24/2018 06/08/2018 06/03/2018 03/26/2018 03/17/2018 03/02/2018 01/19/2018  PHQ - 2 Score 0 0 0 0 0 0 0    Assessment: 72 year old with history of COPD, tracheostomy. history of heart failure, generalized anxiety disorder, depression, chronic cough, polymyalgia rheumatic, HTN, CKD, pacemaker. Admitted 05/22/18-05/27/18 with COPD exacerbation.Emergency room visit on 05/22/18 for shortness of breath.  Routine home visit completed. Client was up ambulating, gait steady, no shortness of breath noted. Client reports generally feeling better. She reports her daughter is still out of town and that her son is there with her.  RNCM sat at the kitchen table with client and assisted with organizing after visit summaries from past office visits. Client did not want to dispose of the old after visit summaries, but She allowed [facility] to separate out and place her latest up to date after visit  summary from each provider in her [facility] Calender/organizer.  Client acknowledges that she is forgetful and could benefit from someone help her with her health care management. RNCM discussed with client and son importance of family helping her with her health care; appointments and medications. Client's son states he wants to help, but that his mother does not want him to. [name] reports she wants him to but that he just stays in the care and when he goes into the office he ask too many questions and she does not like that.   Medications reviewed. Client showed RNCM a bag of medications that have been stopped. She states she Is using  the duonebulizer, RNCM reinforced that she could use four times/day per doctor instructions.  Client reports she saw nephrologist on 06/15/18 Dr. Carolin Sicks. During home visit, his office called and provided instructions for receiving injections and iv medication. Date/time placed on calender. Next appointment is 08/30/18 at 10:30.   Per client Dr. Leafy Kindle, [place] [facility] is weaning her Prednisone. She states she is now taking prednisone 5mg  tablet bottle take one tablet along with prednisone 1mg  bottle 3 tablets for a total of 8mg  daily for a month (started today).  Plan: update [facility] pharmacist. Home visit next month.  Thea Silversmith, RN, MSN, [place] Coordinator Cell: [phone]

## 2018-07-19 ENCOUNTER — Ambulatory Visit (INDEPENDENT_AMBULATORY_CARE_PROVIDER_SITE_OTHER): Payer: Medicare Other | Admitting: Family Medicine

## 2018-07-19 ENCOUNTER — Other Ambulatory Visit: Payer: Self-pay

## 2018-07-19 ENCOUNTER — Encounter: Payer: Self-pay | Admitting: Family Medicine

## 2018-07-19 VITALS — BP 118/70 | HR 70 | Temp 98.1°F | Wt 173.0 lb

## 2018-07-19 DIAGNOSIS — R7309 Other abnormal glucose: Secondary | ICD-10-CM | POA: Diagnosis not present

## 2018-07-19 DIAGNOSIS — J441 Chronic obstructive pulmonary disease with (acute) exacerbation: Secondary | ICD-10-CM | POA: Diagnosis not present

## 2018-07-19 LAB — POCT GLYCOSYLATED HEMOGLOBIN (HGB A1C): HbA1c, POC (controlled diabetic range): 6.5 % (ref 0.0–7.0)

## 2018-07-19 MED ORDER — DOXYCYCLINE HYCLATE 100 MG PO TABS: 100.0000 mg | ORAL_TABLET | Freq: Two times a day (BID) | ORAL | 0 refills | Status: DC

## 2018-07-19 NOTE — Progress Notes (Signed)
    Subjective:  [name] is a 72 y.o. female who presents to the [facility] today with a chief complaint of shortness of breath.   HPI:  Patient is here with concerns of shortness of breath.    She has been having some  increased shortness of breath compared to baseline since the end of June, she was seen for this in the ED on 06/21/2018 at which time she was told to follow-up with her PCP as it was determined that she did not have a COPD exacerbation.   She went to her regularly scheduled follow-up at rheumatology for polymyalgia rheumatica on 07/09/2018. They have her on a very long steroid taper going down by 1 mg every month, currently on 8mg  a day.  She told them that she was worried about pneumonia at that time because she had persistent dyspnea and increased sputum production with some purulence.  They did a chest x-ray upon her request that showed a questionable infiltrate to follow-up with her PCP.   She is here today stating that she has been having difficulty with her breathing, she normally is able to walk to the bathroom and she now gets short of breath.  She has had no fever at home recently.  She does still have increased sputum production but no purulence as it is now "skin colored chunks"  ROS: Per [facility]IN[facility]  Objective:  Physical Exam: BP 118/70   Pulse 70   Temp 98.1 F (36.7 C) (Oral)   Wt 173 lb (78.5 kg)   SpO2 93%   BMI 27.92 kg/m   Gen: NAD, resting comfortably CV: RRR with no murmurs appreciated Pulm: NWOB through trach, CTAB with no crackles, wheezes, or rhonchi in lower lung fields. Some slight transmitted upper airway sounds. GI: Normal bowel sounds present. Soft, Nontender, Nondistended. MSK: no edema, cyanosis, or clubbing noted Skin: warm, dry Neuro: grossly normal, moves all extremities Psych: Normal affect and thought content   Assessment/Plan:  COPD exacerbation (HCC) Precepted with Dr Doren Custard. Patient lung exam is better than in the past however she  is at high risk so treat with doxycycline for 5 days. No redosing with steroids given the long taper she is on. Follow up in 1 week, if no improvement with antibiotics then perhaps patient shortness of breath is due to the decrease in prednisone in which case would need to discuss with rheumatology.   Bufford Lope, DO PGY-3, [facility] 07/19/2018 3:09 PM

## 2018-07-19 NOTE — Patient Instructions (Signed)
Take antibiotic doxycycline twice a day for 5 days.   If you are not better in 1 week, then talk with your rheumatologist and Dr. [name] about the need for steroids.  You can reschedule your appointment with Dr. [name] for next week.

## 2018-07-19 NOTE — Assessment & Plan Note (Signed)
Precepted with Dr Doren Custard. Patient lung exam is better than in the past however she is at high risk so treat with doxycycline for 5 days. No redosing with steroids given the long taper she is on. Follow up in 1 week, if no improvement with antibiotics then perhaps patient shortness of breath is due to the decrease in prednisone in which case would need to discuss with rheumatology.

## 2018-07-20 ENCOUNTER — Ambulatory Visit (HOSPITAL_COMMUNITY)
Admission: RE | Admit: 2018-07-20 | Discharge: 2018-07-20 | Disposition: A | Discharge status: Home / Self Care | Payer: Medicare Other | Source: Ambulatory Visit | Attending: Nephrology | Admitting: Nephrology

## 2018-07-20 VITALS — BP 119/71 | HR 69 | Temp 98.7°F | Resp 15 | Ht 65.0 in | Wt 170.0 lb

## 2018-07-20 DIAGNOSIS — N183 Chronic kidney disease, stage 3 unspecified: Secondary | ICD-10-CM

## 2018-07-20 LAB — POCT HEMOGLOBIN-HEMACUE: Hemoglobin: 9.3 g/dL — ABNORMAL LOW (ref 12.0–15.0)

## 2018-07-20 MED ORDER — EPOETIN ALFA-EPBX 10000 UNIT/ML IJ SOLN
10000.0000 [IU] | INTRAMUSCULAR | Status: DC
  Administered 2018-07-20: 10000 [IU] via SUBCUTANEOUS
  Filled 2018-07-20: qty 1

## 2018-07-21 ENCOUNTER — Other Ambulatory Visit: Payer: Self-pay | Admitting: Pharmacist

## 2018-07-21 ENCOUNTER — Encounter: Payer: Self-pay | Admitting: Internal Medicine

## 2018-07-21 NOTE — Patient Outreach (Signed)
[facility]) Care Management  07/21/2018  [name] 08/10/1946 [id number]   Patient was called to follow up. HIPAA identifiers were obtained. Patient reported that she was taking all her medications as prescribed and was able to get the antibiotic (Doxycyline) that was recently prescribed for COPD exacerbation.  Medications Reviewed Today    Reviewed by Elayne Guerin, [facility] (Pharmacist) on 07/21/18 at 1100  Med List Status: <None>  Medication Order Taking? Sig Documenting Provider Last Dose Status Informant  acetaminophen (TYLENOL) 325 MG tablet [id number] Yes Take 325-650 mg by mouth every 6 (six) hours as needed for mild pain, fever or headache.  [provider] Taking Active Multiple Informants  albuterol (PROVENTIL HFA;VENTOLIN HFA) 108 (90 Base) MCG/ACT inhaler [id number] Yes Inhale 1-2 puffs into the lungs every 6 (six) hours as needed for wheezing or shortness of breath. Steve Rattler, DO Taking Active Multiple Informants           Med Note Nash Mantis, TIFFANI S   Mon May 24, 2018 11:54 AM) LF 02-25-18 25DS  albuterol (PROVENTIL) (2.5 MG/3ML) 0.083% nebulizer solution [id number] No Take 3 mLs (2.5 mg total) by nebulization every 6 (six) hours as needed for wheezing or shortness of breath. Steve Rattler, DO Unknown Active Multiple Informants           Med Note Corinna Lines Jul 05, 2018  1:48 PM) Expired  allopurinol (ZYLOPRIM) 100 MG tablet [id number] Yes Take 2 tablets (200 mg total) by mouth daily. Zenia Resides, MD Taking Active   ALPRAZolam Duanne Moron) 0.5 MG tablet [id number] Yes Take 1 tablet (0.5 mg total) by mouth daily as needed for anxiety. Zenia Resides, MD Taking Active   aspirin EC 81 MG tablet [id number] Yes Take 81 mg by mouth daily.  [provider] Taking Active Multiple Informants  cetirizine (ZYRTEC) 10 MG tablet [id number] Yes Take 10 mg by mouth daily. [provider] Taking Active   doxycycline  (VIBRA-TABS) 100 MG tablet [id number] Yes Take 1 tablet (100 mg total) by mouth 2 (two) times daily. Bufford Lope, DO Taking Active   ferrous sulfate 325 (65 FE) MG tablet [id number] Yes TAKE 1 TABLET BY MOUTH TWICE DAILY WITH MEALS TO KEEP BLOOD COUNT UP  Patient taking differently:  Take 325 mg by mouth 2 (two) times daily with a meal.    Steve Rattler, DO Taking Active Multiple Informants           Med Note Nash Mantis, TIFFANI S   Mon May 24, 2018 11:56 AM) LF 05-12-18 30DS  furosemide (LASIX) 40 MG tablet [id number] Yes Take 1 tablet (40 mg total) by mouth 2 (two) times daily. Zenia Resides, MD Taking Active   Guaifenesin 1200 MG TB12 [id number] Yes Take 1 tablet (1,200 mg total) by mouth 2 (two) times daily. Steve Rattler, DO Taking Active Multiple Informants  ipratropium-albuterol (DUONEB) 0.5-2.5 (3) MG/3ML SOLN [id number] Yes Take 3 mLs by nebulization every 6 (six) hours. Zenia Resides, MD Taking Active   latanoprost (XALATAN) 0.005 % ophthalmic solution [id number] Yes Place one drop in both eyes at bedtime. [provider] Taking Active Multiple Informants           Med Note Nash Mantis, TIFFANI S   Mon May 24, 2018 11:58 AM) LF 02-17-18 68DS  magnesium hydroxide (MILK OF MAGNESIA) 400 MG/5ML suspension [id number] Yes Take 60 mLs by mouth once as needed for mild constipation. [provider] Taking  Active   metoprolol tartrate (LOPRESSOR) 25 MG tablet [id number] Yes TAKE 1 TABLET BY MOUTH EVERY 6 HOURS AS NEEDED FOR HEART FLUTTERING Allred, Jeneen Rinks, MD Taking Active Multiple Informants           Med Note Elayne Guerin   Mon Jul 05, 2018  1:32 PM) Takes PRN  nitroGLYCERIN (NITROSTAT) 0.4 MG SL tablet [id number] Yes Place 1 tablet (0.4 mg total) under the tongue every 5 (five) minutes as needed for chest pain. Eileen Stanford, PA-C Taking Active Multiple Informants           Med Note Nash Mantis, TIFFANI S   Sat Mar 20, 2018  5:35 PM)    Olopatadine HCl 0.2 % SOLN [id number] Yes  Place one to two drops in each once daily as needed for itching. [provider] Taking Active Multiple Informants           Med Note Gershon Cull May 24, 2018 11:59 AM) LF 02-17-18 13DS  polyethylene glycol (MIRALAX / GLYCOLAX) packet [id number] Yes Take 17 g by mouth daily as needed for mild constipation.  [provider] Taking Active Multiple Informants  potassium chloride (K-DUR) 10 MEQ tablet [id number] Yes Take 1 tablet (10 mEq total) by mouth daily. Steve Rattler, DO Taking Active Multiple Informants           Med Note Nash Mantis, TIFFANI S   Mon May 24, 2018 11:53 AM) LF 04-30-18 90DS  predniSONE (DELTASONE) 1 MG tablet [id number] Yes Take 8 mg by mouth daily with breakfast. [provider] Taking Active Multiple Informants           Med Note Nash Mantis, TIFFANI S   Mon May 24, 2018 11:53 AM) LF 04-30-18 30DS  ranitidine (ZANTAC) 150 MG tablet [id number] Yes Take 1 tablet (150 mg total) by mouth daily. Muthersbaugh, Jarrett Soho, PA-C Taking Active Multiple Informants  rosuvastatin (CRESTOR) 20 MG tablet [id number] Yes Take 1 tablet (20 mg total) by mouth daily. Zenia Resides, MD Taking Active   senna (SENOKOT) 8.6 MG TABS tablet [id number] Yes Take 1 tablet by mouth daily as needed for mild constipation. [provider] Taking Active Multiple Informants  sodium chloride (OCEAN) 0.65 % SOLN nasal spray [id number] Yes Place 1 spray into both nostrils as needed for congestion. [provider] Taking Active   triamcinolone cream (KENALOG) 0.1 % [id number] Yes Apply 1 application topically 2 (two) times daily. Zenia Resides, MD Taking Active   Vitamin D, Ergocalciferol, (DRISDOL) 50000 units CAPS capsule [id number] Yes Take 50,000 Units by mouth every 30 (thirty) days. Take one capsule by mouth every 7 days for eight weeks, then take one capsule once a month.  [provider] Taking Active Multiple Informants           Med Note Corinna Lines Jul 05, 2018  1:33 PM)             Patient said she is interested in being assessed for pill packing.   Plan: Home visit scheduled for 08/02/18 for pill packing assessment.   Elayne Guerin, PharmD, [place] Clinical Pharmacist [phone]

## 2018-07-22 ENCOUNTER — Ambulatory Visit: Payer: Medicare Other | Admitting: Family Medicine

## 2018-07-22 ENCOUNTER — Other Ambulatory Visit: Payer: Self-pay | Admitting: *Deleted

## 2018-07-23 ENCOUNTER — Other Ambulatory Visit: Payer: Self-pay | Admitting: Family Medicine

## 2018-07-28 ENCOUNTER — Ambulatory Visit: Payer: Medicare Other | Admitting: Family Medicine

## 2018-08-02 ENCOUNTER — Other Ambulatory Visit: Payer: Self-pay

## 2018-08-02 ENCOUNTER — Other Ambulatory Visit: Payer: Self-pay | Admitting: Pharmacist

## 2018-08-02 NOTE — Patient Outreach (Addendum)
[facility]) Care Management  [place]   08/02/2018  [name] 1946/09/29 [id number]  Subjective: Home visit was completed at the patient's home to assess for pill packing. HIPAA identifiers were obtained.  Patient is a 72 year old female with a past medical history significant for:  COPD, tracheostomy, history of heart failure, generalized anxiety disorder, depression, chronic cough, polymyalgia rheumatica, HTN, CKD, pacemaker. Admitted 05/22/18-05/27/18 with COPD exacerbation. Emergency room visit on 06/21/18 for shortness of breath.  Patient manages her medications on her on with some cognitive delay.  Objective:   Encounter Medications: Outpatient Encounter Medications as of 08/02/2018  Medication Sig Note  . acetaminophen (TYLENOL) 325 MG tablet Take 325-650 mg by mouth every 6 (six) hours as needed for mild pain, fever or headache.    . albuterol (PROVENTIL HFA;VENTOLIN HFA) 108 (90 Base) MCG/ACT inhaler Inhale 1-2 puffs into the lungs every 6 (six) hours as needed for wheezing or shortness of breath. 08/02/2018: PROAIR  . allopurinol (ZYLOPRIM) 100 MG tablet Take 2 tablets (200 mg total) by mouth daily.   [name] ALPRAZolam (XANAX) 0.5 MG tablet Take 1 tablet (0.5 mg total) by mouth daily as needed for anxiety.   [name] aspirin EC 81 MG tablet Take 81 mg by mouth daily.    . cetirizine (ZYRTEC) 10 MG tablet Take 10 mg by mouth daily.   . ferrous sulfate 325 (65 FE) MG tablet TAKE 1 TABLET BY MOUTH TWICE DAILY WITH MEALS TO KEEP BLOOD COUNT UP (Patient taking differently: Take 325 mg by mouth 2 (two) times daily with a meal. ) 05/24/2018: LF 05-12-18 30DS  . furosemide (LASIX) 40 MG tablet Take 1 tablet (40 mg total) by mouth 2 (two) times daily.   [name] ipratropium-albuterol (DUONEB) 0.5-2.5 (3) MG/3ML SOLN Take 3 mLs by nebulization every 6 (six) hours.   [name] latanoprost (XALATAN) 0.005 % ophthalmic solution Place one drop in both eyes at bedtime. 05/24/2018: LF 02-17-18  68DS  . magnesium hydroxide (MILK OF MAGNESIA) 400 MG/5ML suspension Take 60 mLs by mouth once as needed for mild constipation.   . metoprolol tartrate (LOPRESSOR) 25 MG tablet TAKE 1 TABLET BY MOUTH EVERY 6 HOURS AS NEEDED FOR HEART FLUTTERING 07/05/2018: Takes PRN  . nitroGLYCERIN (NITROSTAT) 0.4 MG SL tablet Place 1 tablet (0.4 mg total) under the tongue every 5 (five) minutes as needed for chest pain.   [name] Olopatadine HCl 0.2 % SOLN Place one to two drops in each once daily as needed for itching. 05/24/2018: LF 02-17-18 13DS  . Phenylephrine-DM-GG (ROBAFEN CF MULTI-SYMPTOM COLD) 5-10-100 MG/5ML LIQD Take 10 mLs by mouth daily.   . polyethylene glycol (MIRALAX / GLYCOLAX) packet Take 17 g by mouth daily as needed for mild constipation.    . potassium chloride (K-DUR) 10 MEQ tablet Take 1 tablet (10 mEq total) by mouth daily. 05/24/2018: LF 04-30-18 90DS  . predniSONE (DELTASONE) 1 MG tablet Take 8 mg by mouth daily with breakfast. 05/24/2018: LF 04-30-18 30DS  . ranitidine (ZANTAC) 150 MG tablet Take 1 tablet (150 mg total) by mouth daily.   . rosuvastatin (CRESTOR) 20 MG tablet Take 1 tablet (20 mg total) by mouth daily.   . sodium chloride (OCEAN) 0.65 % SOLN nasal spray Place 1 spray into both nostrils as needed for congestion.   . triamcinolone cream (KENALOG) 0.1 % Apply 1 application topically 2 (two) times daily.   . Vitamin D, Ergocalciferol, (DRISDOL) 50000 units CAPS capsule Take 50,000 Units by mouth every  30 (thirty) days. Take one capsule by mouth every 7 days for eight weeks, then take one capsule once a month.    . senna (SENOKOT) 8.6 MG TABS tablet Take 1 tablet by mouth daily as needed for mild constipation.   . [DISCONTINUED] albuterol (PROVENTIL) (2.5 MG/3ML) 0.083% nebulizer solution Take 3 mLs (2.5 mg total) by nebulization every 6 (six) hours as needed for wheezing or shortness of breath. (Patient not taking: Reported on 08/02/2018) 07/05/2018: Expired  . [DISCONTINUED] doxycycline  (VIBRA-TABS) 100 MG tablet Take 1 tablet (100 mg total) by mouth 2 (two) times daily.   . [DISCONTINUED] Guaifenesin 1200 MG TB12 Take 1 tablet (1,200 mg total) by mouth 2 (two) times daily.    No facility-administered encounter medications on file as of 08/02/2018.     Functional Status: In your present state of health, do you have any difficulty performing the following activities: 06/08/2018 05/22/2018  Hearing? N N  Vision? N N  Difficulty concentrating or making decisions? Y Y  Comment "forgetful sometimes like names" at times  Walking or climbing stairs? Y N  Comment "I  get tired" -  Dressing or bathing? N N  Doing errands, shopping? N N  Comment son take her to the doctor office., she is still driving short distances. Facilities manager and eating ? N -  Using the Toilet? N -  In the past six months, have you accidently leaked urine? N -  Do you have problems with loss of bowel control? N -  Managing your Medications? N -  Managing your Finances? N -  Housekeeping or managing your Housekeeping? N -  Some recent data might be hidden    Fall/Depression Screening: Fall Risk  06/24/2018 06/08/2018 06/03/2018  Falls in the past year? No No No  Number falls in past yr: - - -  Comment - - -  Injury with Fall? - - -  Risk for fall due to : - - -  Follow up - - -   PHQ 2/9 Scores 06/24/2018 06/08/2018 06/03/2018 03/26/2018 03/17/2018 03/02/2018 01/19/2018  PHQ - 2 Score 0 0 0 0 0 0 0      Assessment: Patient's medications were reviewed while in her home.  Drugs sorted by system:  Neurologic/Psychologic: Alprazolam  Cardiovascular: Furosemide Metoprolol Nitroglycerin Aspirin Rosuvastatin  Pulmonary/Allergy: ProAir Ipratropium/Albuterol Guaifenesin/Phenylephrine/Dextromethorphan Saline nasal Spray  Gastrointestinal: Ranitidine Polyethylene Glycol Sennosides  Magnesium-Oxide    Topical: Triamcinolone  Pain: Acetaminophen Prednisone   Vitamins/Minerals: Ferrous Sulfate Ergocalciferol Potassium  Miscellaneous: Allopurinol Olopatadine Latanoprost  Medication Review Findings:  Medications to avoid in the elderly:  Alprazolam  Nitroglycerin tablets were expired-pharmacy and provider's office were called for refills  Adherence  Patient was shown photos of several different pill packing systems and she decided to continue to manage her medications on her own. Patient was afraid she would get confused. After visiting with her today, I agree with her due to her cognitive delay.  Although not ideal, she has a system of putting rubber bands around certain medications and then putting others in a zippered bag.    Pill packing can be revisited in the future at the patient's request.   Plan/Interventions -rescheduled patient's cardiology appointment. -reviewed patient's medications -researched various pill packing options -requested refills on Nitroglycerin -will close patient's pharmacy case--she is still being followed by [facility].   [name], PharmD, [place] Clinical Pharmacist [phone]

## 2018-08-03 ENCOUNTER — Ambulatory Visit (HOSPITAL_COMMUNITY)
Admission: RE | Admit: 2018-08-03 | Discharge: 2018-08-03 | Disposition: A | Discharge status: Home / Self Care | Payer: Medicare Other | Source: Ambulatory Visit | Attending: Nephrology | Admitting: Nephrology

## 2018-08-03 VITALS — BP 141/93 | HR 69 | Temp 98.3°F | Ht 65.0 in | Wt 170.0 lb

## 2018-08-03 DIAGNOSIS — N183 Chronic kidney disease, stage 3 unspecified: Secondary | ICD-10-CM

## 2018-08-03 LAB — POCT HEMOGLOBIN-HEMACUE: Hemoglobin: 9 g/dL — ABNORMAL LOW (ref 12.0–15.0)

## 2018-08-03 MED ORDER — EPOETIN ALFA-EPBX 10000 UNIT/ML IJ SOLN
10000.0000 [IU] | INTRAMUSCULAR | Status: DC
  Administered 2018-08-03: 10000 [IU] via SUBCUTANEOUS
  Filled 2018-08-03: qty 1

## 2018-08-04 ENCOUNTER — Encounter: Payer: Self-pay | Admitting: Family Medicine

## 2018-08-04 ENCOUNTER — Other Ambulatory Visit: Payer: Self-pay

## 2018-08-04 ENCOUNTER — Ambulatory Visit (INDEPENDENT_AMBULATORY_CARE_PROVIDER_SITE_OTHER): Payer: Medicare Other | Admitting: Family Medicine

## 2018-08-04 VITALS — BP 120/75 | HR 80 | Temp 98.2°F | Wt 176.0 lb

## 2018-08-04 DIAGNOSIS — R053 Chronic cough: Secondary | ICD-10-CM

## 2018-08-04 DIAGNOSIS — R05 Cough: Secondary | ICD-10-CM | POA: Diagnosis not present

## 2018-08-04 DIAGNOSIS — Z79899 Other long term (current) drug therapy: Secondary | ICD-10-CM | POA: Diagnosis not present

## 2018-08-04 MED ORDER — NITROGLYCERIN 0.4 MG SL SUBL: 0.4000 mg | SUBLINGUAL_TABLET | SUBLINGUAL | 1 refills | Status: DC | PRN

## 2018-08-04 MED ORDER — ALBUTEROL SULFATE HFA 108 (90 BASE) MCG/ACT IN AERS: 1.0000 | INHALATION_SPRAY | Freq: Four times a day (QID) | RESPIRATORY_TRACT | 6 refills | Status: DC | PRN

## 2018-08-04 MED ORDER — GUAIFENESIN 300 MG/15ML PO SOLN: 300.0000 mg | Freq: Two times a day (BID) | ORAL | 3 refills | Status: DC

## 2018-08-04 NOTE — Patient Instructions (Addendum)
  Please call your rheumatologist to make an appointment at the end of October.  It was great seeing you today! I'm sorry about your congestion. Please continue your medications as you have been.  Please return to be seen if your symptoms worsen or if you develop fevers or chills.   If you have questions or concerns please do not hesitate to call at [phone].  Lucila Maine, DO PGY-3, [facility] 08/04/2018 3:45 PM

## 2018-08-04 NOTE — Progress Notes (Signed)
    Subjective:    Patient ID: [name], female    DOB: 1946-11-02, 72 y.o.   MRN: [id number]   CC: congestion, concern for pneumonia  Congestion- chronic cough with mucous worse at night, she cannot cough it up. Worried she has "a touch of pneumonia". Had a CXR ordered by rheumatologist that said she might have pneumonia. She denies fevers or chills. Denies increase in sputum. Denies worsening cough. She was recently seen and diagnosed with COPD exacerbation and given doxycycline. She reports no improvement with the doxycycline. She has the sensation that there is mucous in her throat around trach. She suctions herself but is unable to get relief. Follows w/ ENT regarding trach. She may need trach rest of her life.   Med refills- reports she needs refill on albuterol and her liquid mucinex. She endorses a lot of confusion over her medications. She has them with her in various plastic bags for review.   Smoking status reviewed- former smoker  Review of Systems- see HPI   Objective:  BP 120/75   Pulse 80   Temp 98.2 F (36.8 C) (Oral)   Wt 176 lb (79.8 kg)   SpO2 98%   BMI 29.29 kg/m  Vitals and nursing note reviewed  General: well nourished, in no acute distress HEENT: normocephalic, MMM Neck: trach in place Cardiac: RRR, clear S1 and S2, no murmurs, rubs, or gallops Respiratory: clear to auscultation bilaterally, no increased work of breathing Extremities: no edema or cyanosis Neuro: alert and oriented, no focal deficits   Assessment & Plan:    Chronic cough  Patient frequently comes in concerned for pneumonia. I have reassured her she does not clinically appear to be sick or have an infection. She has chronic cough and sensation of mucous being stuck in throat. She uses mucinex and various humidifiers with some relief. I have reviewed the recent CXR ordered by rheumatology. Although there is "hazy opacity" seen her lungs are clear, she is afebrile, in no  respiratory distress, well appearing. Will not treat for pneumonia at this time. Discussed reasons to return to be seen. Follow up as needed  Polypharmacy Patient has a lot of confusion surrounding her medications. She has been seen by [facility] pharmacist as outpatient but case was closed. I do think pill packaging would benefit her but patient is reluctant to change her current regimen. She seems overwhelmed with amount of medicines and what medication is for what condition. [facility] community manager continues to follow. I did not make any medication changes today, just reviewed medications with her and answered questions.     Return as needed.   Lucila Maine, DO Family Medicine Resident PGY-3

## 2018-08-05 ENCOUNTER — Encounter: Payer: Self-pay | Admitting: Family Medicine

## 2018-08-05 ENCOUNTER — Other Ambulatory Visit: Payer: Self-pay

## 2018-08-05 NOTE — Assessment & Plan Note (Signed)
Patient has a lot of confusion surrounding her medications. She has been seen by [facility] pharmacist as outpatient but case was closed. I do think pill packaging would benefit her but patient is reluctant to change her current regimen. She seems overwhelmed with amount of medicines and what medication is for what condition. [facility] community manager continues to follow. I did not make any medication changes today, just reviewed medications with her and answered questions.

## 2018-08-05 NOTE — Patient Outreach (Signed)
[facility]) Care Management   08/05/2018  [name] [birth year] [id number]  [name] is an 72 y.o. female  Subjective: client with no complaints today.  Objective:  BP 122/76   Pulse 70   Resp 20   SpO2 96%   Review of Systems  Respiratory:       Lungs clear, tracheostomy intake. Respirations even/unlabored.  Cardiovascular:       Heart rate regular.    Physical Exam skin warm dry, color within normal limits.  Encounter Medications:   Outpatient Encounter Medications as of 08/05/2018  Medication Sig Note  . acetaminophen (TYLENOL) 325 MG tablet Take 325-650 mg by mouth every 6 (six) hours as needed for mild pain, fever or headache.    . albuterol (PROVENTIL HFA;VENTOLIN HFA) 108 (90 Base) MCG/ACT inhaler Inhale 1-2 puffs into the lungs every 6 (six) hours as needed for wheezing or shortness of breath.   . allopurinol (ZYLOPRIM) 100 MG tablet Take 2 tablets (200 mg total) by mouth daily.   [name] ALPRAZolam (XANAX) 0.5 MG tablet Take 1 tablet (0.5 mg total) by mouth daily as needed for anxiety.   [name] aspirin EC 81 MG tablet Take 81 mg by mouth daily.    . cetirizine (ZYRTEC) 10 MG tablet Take 10 mg by mouth daily.   . ferrous sulfate 325 (65 FE) MG tablet TAKE 1 TABLET BY MOUTH TWICE DAILY WITH MEALS TO KEEP BLOOD COUNT UP (Patient taking differently: Take 325 mg by mouth 2 (two) times daily with a meal. ) 05/24/2018: LF 05-12-18 30DS  . furosemide (LASIX) 40 MG tablet Take 1 tablet (40 mg total) by mouth 2 (two) times daily.   [name] guaiFENesin 300 MG/15ML SOLN Take 300 mg by mouth 2 (two) times daily.   [name] ipratropium-albuterol (DUONEB) 0.5-2.5 (3) MG/3ML SOLN Take 3 mLs by nebulization every 6 (six) hours.   [name] latanoprost (XALATAN) 0.005 % ophthalmic solution Place one drop in both eyes at bedtime. 05/24/2018: LF 02-17-18 68DS  . magnesium hydroxide (MILK OF MAGNESIA) 400 MG/5ML suspension Take 60 mLs by mouth once as needed for mild constipation.   .  metoprolol tartrate (LOPRESSOR) 25 MG tablet TAKE 1 TABLET BY MOUTH EVERY 6 HOURS AS NEEDED FOR HEART FLUTTERING 07/05/2018: Takes PRN  . nitroGLYCERIN (NITROSTAT) 0.4 MG SL tablet Place 1 tablet (0.4 mg total) under the tongue every 5 (five) minutes as needed for chest pain.   [name] Olopatadine HCl 0.2 % SOLN Place one to two drops in each once daily as needed for itching. 05/24/2018: LF 02-17-18 13DS  . Phenylephrine-DM-GG (ROBAFEN CF MULTI-SYMPTOM COLD) 5-10-100 MG/5ML LIQD Take 10 mLs by mouth daily.   . polyethylene glycol (MIRALAX / GLYCOLAX) packet Take 17 g by mouth daily as needed for mild constipation.    . potassium chloride (K-DUR) 10 MEQ tablet Take 1 tablet (10 mEq total) by mouth daily. 05/24/2018: LF 04-30-18 90DS  . predniSONE (DELTASONE) 1 MG tablet Take 8 mg by mouth daily with breakfast. 05/24/2018: LF 04-30-18 30DS  . ranitidine (ZANTAC) 150 MG tablet Take 1 tablet (150 mg total) by mouth daily.   . rosuvastatin (CRESTOR) 20 MG tablet Take 1 tablet (20 mg total) by mouth daily.   [name] senna (SENOKOT) 8.6 MG TABS tablet Take 1 tablet by mouth daily as needed for mild constipation.   . sodium chloride (OCEAN) 0.65 % SOLN nasal spray Place 1 spray into both nostrils as needed for congestion.   . triamcinolone cream (KENALOG) 0.1 %  Apply 1 application topically 2 (two) times daily.   . Vitamin D, Ergocalciferol, (DRISDOL) 50000 units CAPS capsule Take 50,000 Units by mouth every 30 (thirty) days. Take one capsule by mouth every 7 days for eight weeks, then take one capsule once a month.     No facility-administered encounter medications on file as of 08/05/2018.     Functional Status:   In your present state of health, do you have any difficulty performing the following activities: 06/08/2018 05/22/2018  Hearing? N N  Vision? N N  Difficulty concentrating or making decisions? Y Y  Comment "forgetful sometimes like names" at times  Walking or climbing stairs? Y N  Comment "I  get tired" -   Dressing or bathing? N N  Doing errands, shopping? N N  Comment son take her to the doctor office., she is still driving short distances. Facilities manager and eating ? N -  Using the Toilet? N -  In the past six months, have you accidently leaked urine? N -  Do you have problems with loss of bowel control? N -  Managing your Medications? N -  Managing your Finances? N -  Housekeeping or managing your Housekeeping? N -  Some recent data might be hidden    Fall/Depression Screening:    Fall Risk  06/24/2018 06/08/2018 06/03/2018  Falls in the past year? No No No  Number falls in past yr: - - -  Comment - - -  Injury with Fall? - - -  Risk for fall due to : - - -  Follow up - - -   PHQ 2/9 Scores 08/04/2018 06/24/2018 06/08/2018 06/03/2018 03/26/2018 03/17/2018 03/02/2018  PHQ - 2 Score 0 0 0 0 0 0 0    Assessment:  72 year old with history of COPD, tracheostomy. history of heart failure, generalized anxiety disorder, depression, chronic cough, polymyalgia rheumatic, HTN, CKD, pacemaker. Admitted 05/22/18-05/27/18 with COPD exacerbation.Emergency room visit on 05/22/18 for shortness of breath.  RNCM completed home visit. Client with no complaints or issues at this time. She reports her daughter is home now. RNCM reviewed upcoming appointments and placed all upcoming appointment on client's calender.  RNCM discussed reinforcement of COPD action plan. But could really benefit from added support/palliative care. Client is agreeable.   Plan: referral to care connection program. RNCM will continue to follow.  Thea Silversmith, RN, MSN, [place] Coordinator Cell: [phone]

## 2018-08-05 NOTE — Assessment & Plan Note (Signed)
  Patient frequently comes in concerned for pneumonia. I have reassured her she does not clinically appear to be sick or have an infection. She has chronic cough and sensation of mucous being stuck in throat. She uses mucinex and various humidifiers with some relief. I have reviewed the recent CXR ordered by rheumatology. Although there is "hazy opacity" seen her lungs are clear, she is afebrile, in no respiratory distress, well appearing. Will not treat for pneumonia at this time. Discussed reasons to return to be seen. Follow up as needed

## 2018-08-09 DIAGNOSIS — Z430 Encounter for attention to tracheostomy: Secondary | ICD-10-CM | POA: Diagnosis not present

## 2018-08-10 ENCOUNTER — Telehealth: Payer: Self-pay

## 2018-08-10 NOTE — Telephone Encounter (Signed)
[name] with Hospice and Palliative Care, needs verbal orders:  Palliative care services, in home, includes RN and social worker visits - referred by [facility].  Needs verbal order to start.  [name] call back is [phone]  Danley Danker, RN [facility] RN)

## 2018-08-11 NOTE — Telephone Encounter (Signed)
Verbal orders given to [place].  Lucila Maine, DO PGY-3, [facility] 08/11/2018 11:35 AM

## 2018-08-23 ENCOUNTER — Other Ambulatory Visit: Payer: Self-pay

## 2018-08-23 NOTE — Patient Outreach (Signed)
[facility]) Care Management  08/23/2018  [name] May 14, 1946 [id number]   RNCM spoke with Ronnell [place] re: follow up on referral for Care Connection program. [name] states she has been having trouble reaching client. RNCM called both numbers and client returned RNCM's call. RNCM reinforced that Ms. Mervyn Skeeters has been trying to reach her and encouraged her to answer/return [name] call.   RNCM returned call to care connections confirming contact number.  Plan: follow up to confirm that care has been established with care connection program.  Thea Silversmith, RN, MSN, [place] Coordinator Cell: [phone]

## 2018-08-30 DIAGNOSIS — Z23 Encounter for immunization: Secondary | ICD-10-CM | POA: Diagnosis not present

## 2018-08-30 DIAGNOSIS — N184 Chronic kidney disease, stage 4 (severe): Secondary | ICD-10-CM | POA: Diagnosis not present

## 2018-08-30 DIAGNOSIS — D631 Anemia in chronic kidney disease: Secondary | ICD-10-CM | POA: Diagnosis not present

## 2018-08-30 DIAGNOSIS — N2581 Secondary hyperparathyroidism of renal origin: Secondary | ICD-10-CM | POA: Diagnosis not present

## 2018-08-30 DIAGNOSIS — I129 Hypertensive chronic kidney disease with stage 1 through stage 4 chronic kidney disease, or unspecified chronic kidney disease: Secondary | ICD-10-CM | POA: Diagnosis not present

## 2018-09-02 ENCOUNTER — Encounter: Payer: Medicare Other | Admitting: Internal Medicine

## 2018-09-02 ENCOUNTER — Telehealth: Payer: Self-pay

## 2018-09-02 NOTE — Telephone Encounter (Signed)
[name], RN with Care connection saw patient today. Patient complains of pain all over her body and joints. Patient thinks it is related to her Rosuvastatin. Was on this in past and stopped due to same reason. Stopped this med 2 days ago.  Patient would like PCP to know and know if there is something else to do.  Call back is [phone]  Danley Danker, RN [facility])

## 2018-09-02 NOTE — Telephone Encounter (Signed)
Please ask patient to see her rheumatologist to discuss this- she has polymyalgia rheumatica and likely needs to go back up on steroid dose per their recommendations. I doubt it was related to the statin but ok. Thank you!

## 2018-09-03 NOTE — Telephone Encounter (Signed)
Patient aware and will contact Dr. Janee Morn office.  Jazmin Hartsell,CMA

## 2018-09-10 ENCOUNTER — Other Ambulatory Visit: Payer: Self-pay

## 2018-09-10 NOTE — Patient Outreach (Signed)
[facility]) Care Management  09/10/2018  [name] 1946/03/20 [id number]   RNCM called to follow up regarding care connections start of care. Client reports they have started care with her and that she has the contact number.   RNCM discussed closing out of case as she will be managed by Care Connections program. Client is agreeable.  Plan: close case.  Thea Silversmith, RN, MSN, [place] Coordinator Cell: [phone]

## 2018-09-15 ENCOUNTER — Ambulatory Visit: Payer: Self-pay

## 2018-09-17 ENCOUNTER — Other Ambulatory Visit: Payer: Self-pay

## 2018-09-17 NOTE — Patient Outreach (Signed)
[name]) Care Management  09/17/2018  [name] 03-08-1946 [id number]   RNCM received voice message from [place] at [facility] that client called, but client is not a patient with them.   Client is active with Care connections Program with [facility] [place]. RNCM called Care connections program to notify. RNCM was told that clients care manager with the care connections program would call client.   Thea Silversmith, RN, MSN, [place] Coordinator Cell: [phone]

## 2018-09-20 ENCOUNTER — Other Ambulatory Visit (HOSPITAL_COMMUNITY): Payer: Self-pay

## 2018-09-20 ENCOUNTER — Other Ambulatory Visit: Payer: Self-pay | Admitting: Family Medicine

## 2018-09-20 ENCOUNTER — Telehealth: Payer: Self-pay

## 2018-09-20 DIAGNOSIS — J441 Chronic obstructive pulmonary disease with (acute) exacerbation: Secondary | ICD-10-CM

## 2018-09-20 NOTE — Telephone Encounter (Signed)
I ordered a CXR, patient can get this at hospital tomorrow. Please let her know.

## 2018-09-20 NOTE — Telephone Encounter (Signed)
[name], a nurse working with patient today, LVM on nurse line stating the patient has been coughing "pretty bad" yellow dark sputum coming up. The pt is going to [place] tomorrow for an injection and wants to know if her doctor can put in for a chest xray.   You can either call her directly or tracy. Tracy's number is [phone].

## 2018-09-21 ENCOUNTER — Other Ambulatory Visit: Payer: Self-pay | Admitting: Family Medicine

## 2018-09-21 ENCOUNTER — Ambulatory Visit (HOSPITAL_COMMUNITY)
Admission: RE | Admit: 2018-09-21 | Discharge: 2018-09-21 | Disposition: A | Discharge status: Home / Self Care | Payer: Medicare Other | Source: Ambulatory Visit | Attending: Nephrology | Admitting: Nephrology

## 2018-09-21 ENCOUNTER — Ambulatory Visit (HOSPITAL_COMMUNITY)
Admission: RE | Admit: 2018-09-21 | Discharge: 2018-09-21 | Disposition: A | Discharge status: Home / Self Care | Payer: Medicare Other | Source: Ambulatory Visit | Attending: Family Medicine | Admitting: Family Medicine

## 2018-09-21 VITALS — BP 110/79 | HR 70 | Temp 98.6°F | Resp 20

## 2018-09-21 DIAGNOSIS — J441 Chronic obstructive pulmonary disease with (acute) exacerbation: Secondary | ICD-10-CM | POA: Insufficient documentation

## 2018-09-21 DIAGNOSIS — J449 Chronic obstructive pulmonary disease, unspecified: Secondary | ICD-10-CM

## 2018-09-21 DIAGNOSIS — N183 Chronic kidney disease, stage 3 unspecified: Secondary | ICD-10-CM

## 2018-09-21 DIAGNOSIS — D631 Anemia in chronic kidney disease: Secondary | ICD-10-CM | POA: Insufficient documentation

## 2018-09-21 DIAGNOSIS — R05 Cough: Secondary | ICD-10-CM | POA: Diagnosis not present

## 2018-09-21 LAB — POCT HEMOGLOBIN-HEMACUE: Hemoglobin: 9 g/dL — ABNORMAL LOW (ref 12.0–15.0)

## 2018-09-21 MED ORDER — FERUMOXYTOL INJECTION 510 MG/17 ML
510.0000 mg | Freq: Once | INTRAVENOUS | Status: AC
  Administered 2018-09-21: 510 mg via INTRAVENOUS
  Filled 2018-09-21: qty 17

## 2018-09-21 MED ORDER — EPOETIN ALFA-EPBX 10000 UNIT/ML IJ SOLN
10000.0000 [IU] | INTRAMUSCULAR | Status: DC
  Administered 2018-09-21: 10000 [IU] via SUBCUTANEOUS
  Filled 2018-09-21: qty 1

## 2018-09-21 NOTE — Telephone Encounter (Signed)
LVM for [name] informing her of CXR order in and patient is free to get whenever convenient.

## 2018-09-22 ENCOUNTER — Other Ambulatory Visit: Payer: Self-pay

## 2018-09-22 ENCOUNTER — Telehealth: Payer: Self-pay

## 2018-09-22 ENCOUNTER — Encounter (HOSPITAL_COMMUNITY): Payer: Self-pay | Admitting: Emergency Medicine

## 2018-09-22 ENCOUNTER — Emergency Department (HOSPITAL_COMMUNITY)
Admission: EM | Admit: 2018-09-22 | Discharge: 2018-09-23 | Disposition: A | Discharge status: Home / Self Care | Payer: Medicare Other | Attending: Emergency Medicine | Admitting: Emergency Medicine

## 2018-09-22 DIAGNOSIS — Z7901 Long term (current) use of anticoagulants: Secondary | ICD-10-CM | POA: Diagnosis not present

## 2018-09-22 DIAGNOSIS — Z950 Presence of cardiac pacemaker: Secondary | ICD-10-CM | POA: Insufficient documentation

## 2018-09-22 DIAGNOSIS — J449 Chronic obstructive pulmonary disease, unspecified: Secondary | ICD-10-CM | POA: Diagnosis not present

## 2018-09-22 DIAGNOSIS — Z79899 Other long term (current) drug therapy: Secondary | ICD-10-CM | POA: Diagnosis not present

## 2018-09-22 DIAGNOSIS — Z7982 Long term (current) use of aspirin: Secondary | ICD-10-CM | POA: Insufficient documentation

## 2018-09-22 DIAGNOSIS — I130 Hypertensive heart and chronic kidney disease with heart failure and stage 1 through stage 4 chronic kidney disease, or unspecified chronic kidney disease: Secondary | ICD-10-CM | POA: Insufficient documentation

## 2018-09-22 DIAGNOSIS — J019 Acute sinusitis, unspecified: Secondary | ICD-10-CM

## 2018-09-22 DIAGNOSIS — R9431 Abnormal electrocardiogram [ECG] [EKG]: Secondary | ICD-10-CM | POA: Diagnosis not present

## 2018-09-22 DIAGNOSIS — N183 Chronic kidney disease, stage 3 (moderate): Secondary | ICD-10-CM | POA: Diagnosis not present

## 2018-09-22 DIAGNOSIS — Z87891 Personal history of nicotine dependence: Secondary | ICD-10-CM | POA: Insufficient documentation

## 2018-09-22 DIAGNOSIS — R0602 Shortness of breath: Secondary | ICD-10-CM | POA: Diagnosis not present

## 2018-09-22 DIAGNOSIS — I509 Heart failure, unspecified: Secondary | ICD-10-CM | POA: Diagnosis not present

## 2018-09-22 DIAGNOSIS — I252 Old myocardial infarction: Secondary | ICD-10-CM | POA: Insufficient documentation

## 2018-09-22 LAB — I-STAT TROPONIN, ED: TROPONIN I, POC: 0.15 ng/mL — AB (ref 0.00–0.08)

## 2018-09-22 LAB — BASIC METABOLIC PANEL
ANION GAP: 9 (ref 5–15)
BUN: 36 mg/dL — AB (ref 8–23)
CO2: 26 mmol/L (ref 22–32)
Calcium: 9.9 mg/dL (ref 8.9–10.3)
Chloride: 104 mmol/L (ref 98–111)
Creatinine, Ser: 1.68 mg/dL — ABNORMAL HIGH (ref 0.44–1.00)
GFR calc Af Amer: 33 mL/min — ABNORMAL LOW (ref >60)
GFR calc non Af Amer: 29 mL/min — ABNORMAL LOW (ref >60)
GLUCOSE: 106 mg/dL — AB (ref 70–99)
POTASSIUM: 3.9 mmol/L (ref 3.5–5.1)
Sodium: 139 mmol/L (ref 135–145)

## 2018-09-22 LAB — CBC
HEMATOCRIT: 31.5 % — AB (ref 36.0–46.0)
HEMOGLOBIN: 8.9 g/dL — AB (ref 12.0–15.0)
MCH: 25.6 pg — AB (ref 26.0–34.0)
MCHC: 28.3 g/dL — AB (ref 30.0–36.0)
MCV: 90.8 fL (ref 80.0–100.0)
Platelets: 297 10*3/uL (ref 150–400)
RBC: 3.47 MIL/uL — AB (ref 3.87–5.11)
RDW: 16.5 % — ABNORMAL HIGH (ref 11.5–15.5)
WBC: 7.5 10*3/uL (ref 4.0–10.5)
nRBC: 0.8 % — ABNORMAL HIGH (ref 0.0–0.2)

## 2018-09-22 MED ORDER — IPRATROPIUM-ALBUTEROL 0.5-2.5 (3) MG/3ML IN SOLN
3.0000 mL | Freq: Once | RESPIRATORY_TRACT | Status: AC
  Administered 2018-09-23: 3 mL via RESPIRATORY_TRACT
  Filled 2018-09-22: qty 3

## 2018-09-22 NOTE — ED Notes (Signed)
Informed Caitlynn in triage and CN Tori of troponin results .15

## 2018-09-22 NOTE — Telephone Encounter (Signed)
LVM for patient to call me back to inform of normal results.

## 2018-09-22 NOTE — Progress Notes (Signed)
Please let [name] know her chest x-ray was normal, no signs of pneumonia.

## 2018-09-22 NOTE — ED Triage Notes (Addendum)
Pt presents to ED for assessment of SOB/difficulty breathing through her trach for 2 weeks.  States she was seen at PCP and had an xray, results show negative.  Pt states she has had cold-like symptoms and feels like she can't empty out the mucous in her trach.  Lung sounds clear.  Pt c/o intermittent left chest pain x 3 days, c/o nausea, denies diaphoresis, c/o light-headedness, denies radiation

## 2018-09-22 NOTE — Telephone Encounter (Signed)
-----   Message from Steve Rattler, DO sent at 09/22/2018 10:01 AM EDT ----- Please let [name] know her chest x-ray was normal, no signs of pneumonia.

## 2018-09-23 ENCOUNTER — Encounter: Payer: Self-pay | Admitting: Internal Medicine

## 2018-09-23 ENCOUNTER — Ambulatory Visit (INDEPENDENT_AMBULATORY_CARE_PROVIDER_SITE_OTHER): Payer: Medicare Other | Admitting: Internal Medicine

## 2018-09-23 VITALS — BP 126/76 | HR 70 | Ht 65.0 in | Wt 171.8 lb

## 2018-09-23 DIAGNOSIS — Z950 Presence of cardiac pacemaker: Secondary | ICD-10-CM

## 2018-09-23 DIAGNOSIS — I5032 Chronic diastolic (congestive) heart failure: Secondary | ICD-10-CM | POA: Diagnosis not present

## 2018-09-23 DIAGNOSIS — I10 Essential (primary) hypertension: Secondary | ICD-10-CM | POA: Diagnosis not present

## 2018-09-23 DIAGNOSIS — I4821 Permanent atrial fibrillation: Secondary | ICD-10-CM

## 2018-09-23 DIAGNOSIS — I442 Atrioventricular block, complete: Secondary | ICD-10-CM

## 2018-09-23 LAB — I-STAT TROPONIN, ED: TROPONIN I, POC: 0.19 ng/mL — AB (ref 0.00–0.08)

## 2018-09-23 LAB — BRAIN NATRIURETIC PEPTIDE: B Natriuretic Peptide: 533.1 pg/mL — ABNORMAL HIGH (ref 0.0–100.0)

## 2018-09-23 MED ORDER — DOXYCYCLINE HYCLATE 100 MG PO CAPS: 100.0000 mg | ORAL_CAPSULE | Freq: Two times a day (BID) | ORAL | 0 refills | Status: AC

## 2018-09-23 MED ORDER — DOXYCYCLINE HYCLATE 100 MG PO TABS
100.0000 mg | ORAL_TABLET | Freq: Once | ORAL | Status: AC
  Administered 2018-09-23: 100 mg via ORAL
  Filled 2018-09-23: qty 1

## 2018-09-23 NOTE — ED Notes (Signed)
Pt departed in NAD.  

## 2018-09-23 NOTE — Patient Instructions (Addendum)
Medication Instructions:  Your physician recommends that you continue on your current medications as directed. Please refer to the Current Medication list given to you today.  Labwork: None ordered.  Testing/Procedures: None ordered.  Follow-Up:  Your physician wants you to follow-up in: 6  months with [name].   You will receive a reminder letter in the mail two months in advance. If you don't receive a letter, please call our office to schedule the follow-up appointment.   Your physician wants you to follow-up in: one year with Chanetta Marshall, NP.   You will receive a reminder letter in the mail two months in advance. If you don't receive a letter, please call our office to schedule the follow-up appointment.   Any Other Special Instructions Will Be Listed Below (If Applicable).  If you need a refill on your cardiac medications before your next appointment, please call your pharmacy.

## 2018-09-23 NOTE — ED Provider Notes (Signed)
Medical screening examination/treatment/procedure(s) were conducted as a shared visit with non-physician practitioner(s) and myself.  I personally evaluated the patient during the encounter.  EKG Interpretation  Date/Time:  Wednesday September 22 2018 22:05:27 EDT Ventricular Rate:  70 PR Interval:    QRS Duration: 206 QT Interval:  504 QTC Calculation: 544 R Axis:   -25 Text Interpretation:  VENTRICULAR PACED RHYTHM Abnormal ECG No significant change since last tracing Confirmed by Pryor Curia [phone]) on 09/22/2018 11:40:28 PM   Patient is a 72 year old female with history of COPD, trach who presents to the emergency department 2 weeks of shortness of breath worse with exertion.  Has had chest "fluttering" but no significant chest pain.  Reports some yellow drainage from her trach and nonproductive cough.  No fever.  Labs here appear to be at her baseline.  She always has a slightly elevated troponin.  No significant change x2.  EKG shows paced rhythm.  Chest x-ray done by her PCP today showed no sign of volume overload or pneumonia.  She has follow-up with cardiology in the morning.  She would like discharge home.  Will discharge with prescription of doxycycline given infectious symptoms.  Patient comfortable with this plan.   Riely Baskett, Delice Bison, DO 09/23/18 [phone]

## 2018-09-23 NOTE — Discharge Instructions (Addendum)
Please use your breathing treatments every 4 hours.  If your shortness of breath or chest pain worsens please seek additional medical care.  Please make sure that your primary care doctor and cardiologist know about your ED visit.  As we discussed the antibiotics are for the sinus infection given that it has been going on for more than 10 days.  This has the added benefit of treating pneumonia, even though you do not appear to have one today.  Please make sure that you keep taking your prednisone.

## 2018-09-23 NOTE — ED Notes (Signed)
Dr Leonides Schanz informed of troponin results .[place]

## 2018-09-23 NOTE — ED Provider Notes (Signed)
[facility] EMERGENCY DEPARTMENT Provider Note   CSN: [id number] Arrival date & time: 09/22/18  2156     History   Chief Complaint Chief Complaint  Patient presents with  . Shortness of Breath    HPI [name] is a 72 y.o. female with past medical history of CHF, complete heart block with pacer present, tracheostomy, CKD, hypertension, who presents today for evaluation of 2 weeks of shortness of breath.  She reports that she has had cold-like symptoms for the past 2 weeks that are not getting better, and with that has had gradually worsening chest congestion.  She reports that she is deep suctioning frequently and occasionally using her breathing treatments.  She reports minimal mucus is able to be suctioned out.  She also reports intermittent left chest pain for the past 3 days.  She states that it comes and goes randomly and is not associated with nausea, diaphoresis, or vomiting.  She denies doing lightheaded or dizzy.  She reports sinus congestion, runny nose, stuffy nose.  HPI  Past Medical History:  Diagnosis Date  . Anemia   . Angina   . Angioedema    2/2 ACE  . Arteriovenous malformation of stomach   . Arthritis   . Asthma   . AVM (arteriovenous malformation) of colon    small intestine; stomach  . Blood transfusion   . CHF (congestive heart failure) ([place])   . Complete heart block ([place])    s/p PPM 1998  . Complication of anesthesia    Difficult airway; anaphylaxis and swelling with propofol  . DDD (degenerative disc disease)   . Depression   . Diastolic heart failure   . Fatty liver 07/26/10  . GERD (gastroesophageal reflux disease)   . GI bleed   . Headache   . History of alcohol abuse Stopped Fall 2012  . History of tobacco use Quit Fall 2012  . Hx of cardiovascular stress test    a. Lexiscan Myoview (10/15):  Small inferolateral and apical defect c/w scar and poss soft tissue attenuation, no ischemia, EF 42%  . Hx of colonic  polyp 08/13/10   adenomatous  . Hx of colonoscopy   . Hyperlipidemia   . Hyperlipidemia   . Hypertension   . Hypertrophic cardiomyopathy ([place])    dx by Dr [name] 2009  . Iron deficiency anemia   . Myocardial infarction ([place])   . Panic attack   . Panic attacks   . Permanent atrial fibrillation   . Pneumonia   . Renal failure    baseline creatinine 1.6  . Right arm pain 01/08/2012  . RLS (restless legs syndrome)    Dx 06/2007  . Shortness of breath    sob on exertation  . Sleep apnea     Patient Active Problem List   Diagnosis Date Noted  . Polypharmacy 06/25/2018  . Depression with anxiety   . Tracheostomy in place [facility])   . Tracheostomy care ([place])   . Calculus of gallbladder without cholecystitis without obstruction 12/24/2017  . Chronic cough 09/25/2017  . History of colonic polyps   . Polyp of cecum   . Polyp of ascending colon   . Polyp of transverse colon   . Polyp of descending colon   . Polyp of rectum   . Moderate protein-calorie malnutrition ([place]) 06/02/2017  . Chronic idiopathic constipation 03/19/2017  . Loss of weight 01/26/2017  . 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 Permanent atrial fibrillation   . Laryngeal stenosis 09/18/2016  .  Esophageal dysphagia   . COPD exacerbation ([place])   . Polymyalgia rheumatica ([place]) 04/10/2015  . Low back pain 03/28/2015  . Hypertrophic cardiomyopathy ([place]) 02/15/2015  . Eczema 09/18/2014  . Environmental allergies 09/18/2014  . Pre-diabetes 11/26/2013  . COPD, moderate ([place]) 11/01/2013  . Complete heart block ([place]) 04/03/2011  . Anemia 02/17/2011  . AVM (arteriovenous malformation) 02/13/2011  . PACEMAKER-[facility] 11/28/2010  . CKD (chronic kidney disease) stage 3, GFR 30-59 ml/min (HCC) 09/24/2010  . Acute on chronic congestive heart failure ([place]) 08/30/2010  . ARTHRITIS 07/24/2010  . Generalized anxiety disorder 07/23/2010  . Hyperlipidemia 02/11/2007  . Essential hypertension 02/11/2007  . APNEA, SLEEP 02/11/2007     Past Surgical History:  Procedure Laterality Date  . CARDIAC CATHETERIZATION    . CARDIAC CATHETERIZATION N/A 08/19/2016   Procedure: Right/Left Heart Cath and Coronary Angiography;  Surgeon: Jolaine Artist, MD;  Location: [facility] CV LAB;  Service: Cardiovascular;  Laterality: N/A;  . COLONOSCOPY WITH PROPOFOL N/A 06/16/2017   Procedure: COLONOSCOPY WITH PROPOFOL;  Surgeon: Mauri Pole, MD;  Location: [facility] ENDOSCOPY;  Service: Endoscopy;  Laterality: N/A;  . DIRECT LARYNGOSCOPY N/A 09/18/2016   Procedure: DIRECT LARYNGOSCOPY, BRONCHOSCOPY, REMOVAL OF INTUBATION GRANULOMA;  Surgeon: [name], MD;  Location: [facility] OR;  Service: ENT;  Laterality: N/A;  . DIRECT LARYNGOSCOPY N/A 10/20/2016   Procedure: EXTUBATION AND FLEXIBLE LARYNGOSCOPE;  Surgeon: [name], MD;  Location: [facility];  Service: ENT;  Laterality: N/A;  . DIRECT LARYNGOSCOPY N/A 10/29/2016   Procedure: DIRECT LARYNGOSCOPY;  Surgeon: [name], MD;  Location: [facility] OR;  Service: ENT;  Laterality: N/A;  . ESOPHAGOGASTRODUODENOSCOPY  12/23/2011   Procedure: ESOPHAGOGASTRODUODENOSCOPY (EGD);  Surgeon: [name], MD;  Location: [facility];  Service: Endoscopy;  Laterality: N/A;  . ESOPHAGOGASTRODUODENOSCOPY (EGD) WITH PROPOFOL N/A 06/16/2017   Procedure: ESOPHAGOGASTRODUODENOSCOPY (EGD) WITH PROPOFOL;  Surgeon: Mauri Pole, MD;  Location: [facility];  Service: Endoscopy;  Laterality: N/A;  . EXTUBATION (ENDOTRACHEAL) IN OR N/A 07/21/2016   Procedure: EXTUBATION (ENDOTRACHEAL) IN OR;  Surgeon: [name], MD;  Location: [facility];  Service: ENT;  Laterality: N/A;  . GIVENS CAPSULE STUDY  12/23/2011   Procedure: GIVENS CAPSULE STUDY;  Surgeon: [name], MD;  Location: [facility] ENDOS[facility];  Service: Endoscopy;  Laterality: N/A;  . MICROLARYNGOSCOPY WITH LASER N/A 10/16/2016   Procedure: MICRODIRECTLARYNGOSCOPY WITH LASER ABLATION AND KENLOG INJECTION;  Surgeon: [name], MD;  Location: [facility];  Service: ENT;   Laterality: N/A;  . PACEMAKER INSERTION  1998   [facility], most recent gen change by [name] 4/12  . TRACHEOSTOMY TUBE PLACEMENT N/A 10/29/2016   Procedure: TRACHEOSTOMY;  Surgeon: [name], MD;  Location: [facility];  Service: ENT;  Laterality: N/A;  . TUBAL LIGATION  04/01/2000     OB History   None      Home Medications    Prior to Admission medications   Medication Sig Start Date End Date Taking? Authorizing Provider  acetaminophen (TYLENOL) 325 MG tablet Take 325-650 mg by mouth every 6 (six) hours as needed for mild pain, fever or headache.    Yes [provider]  albuterol (PROVENTIL HFA;VENTOLIN HFA) 108 (90 Base) MCG/ACT inhaler Inhale 1-2 puffs into the lungs every 6 (six) hours as needed for wheezing or shortness of breath. 08/04/18  Yes Riccio, Angela C, DO  allopurinol (ZYLOPRIM) 100 MG tablet Take 2 tablets (200 mg total) by mouth daily. 06/03/18  Yes Hensel, Jamal Collin, MD  ALPRAZolam Duanne Moron)  0.5 MG tablet Take 1 tablet (0.5 mg total) by mouth daily as needed for anxiety. 06/24/18  Yes Zenia Resides, MD  aspirin EC 81 MG tablet Take 81 mg by mouth daily.    Yes [provider]  cetirizine (ZYRTEC) 10 MG tablet Take 10 mg by mouth daily.   Yes [provider]  ferrous sulfate 325 (65 FE) MG tablet TAKE 1 TABLET BY MOUTH TWICE DAILY WITH MEALS TO KEEP BLOOD COUNT UP Patient taking differently: Take 325 mg by mouth 2 (two) times daily with a meal.  08/26/16  Yes Riccio, Angela C, DO  furosemide (LASIX) 40 MG tablet Take 1 tablet (40 mg total) by mouth 2 (two) times daily. 06/03/18  Yes Hensel, Jamal Collin, MD  guaiFENesin 300 MG/15ML SOLN Take 300 mg by mouth 2 (two) times daily. 08/04/18  Yes Riccio, Angela C, DO  ipratropium-albuterol (DUONEB) 0.5-2.5 (3) MG/3ML SOLN USE 3 ML VIA NEBULIZER EVERY 6 HOURS Patient taking differently: Inhale 3 mLs into the lungs every 6 (six) hours.  09/21/18  Yes Riccio, Angela C, DO  latanoprost (XALATAN) 0.005 % ophthalmic  solution Place one drop in both eyes at bedtime. 02/16/18  Yes [provider]  magnesium hydroxide (MILK OF MAGNESIA) 400 MG/5ML suspension Take 60 mLs by mouth daily as needed for mild constipation.    Yes [provider]  metoprolol tartrate (LOPRESSOR) 25 MG tablet TAKE 1 TABLET BY MOUTH EVERY 6 HOURS AS NEEDED FOR HEART FLUTTERING Patient taking differently: Take 25 mg by mouth every 6 (six) hours as needed (heart fluttering). Take 1 tablet by mouth every 6 hours as needed for heart fluttering 04/08/17  Yes Allred, Jeneen Rinks, MD  nitroGLYCERIN (NITROSTAT) 0.4 MG SL tablet Place 1 tablet (0.4 mg total) under the tongue every 5 (five) minutes as needed for chest pain. 08/04/18  Yes Riccio, Angela C, DO  Olopatadine HCl 0.2 % SOLN Place one to two drops in each once daily as needed for itching. 02/17/18  Yes [provider]  Phenylephrine-DM-GG (ROBAFEN CF MULTI-SYMPTOM COLD) 5-10-100 MG/5ML LIQD Take 10 mLs by mouth daily as needed (cold).    Yes [provider]  polyethylene glycol (MIRALAX / GLYCOLAX) packet Take 17 g by mouth daily as needed for mild constipation.    Yes [provider]  potassium chloride (K-DUR) 10 MEQ tablet Take 1 tablet (10 mEq total) by mouth daily. 02/05/18  Yes Riccio, Angela C, DO  predniSONE (DELTASONE) 1 MG tablet Take 7 mg by mouth daily with breakfast.    Yes [provider]  ranitidine (ZANTAC) 150 MG tablet Take 1 tablet (150 mg total) by mouth daily. 03/28/18  Yes Muthersbaugh, Jarrett Soho, PA-C  rosuvastatin (CRESTOR) 20 MG tablet Take 1 tablet (20 mg total) by mouth daily. 06/03/18  Yes Hensel, Jamal Collin, MD  senna (SENOKOT) 8.6 MG TABS tablet Take 1 tablet by mouth daily as needed for mild constipation.   Yes [provider]  sodium chloride (OCEAN) 0.65 % SOLN nasal spray Place 1 spray into both nostrils daily as needed for congestion.    Yes [provider]  triamcinolone cream (KENALOG) 0.1 % Apply 1  application topically 2 (two) times daily. Patient taking differently: Apply 1 application topically 2 (two) times daily as needed (rash).  06/03/18  Yes Hensel, Jamal Collin, MD  doxycycline (VIBRAMYCIN) 100 MG capsule Take 1 capsule (100 mg total) by mouth 2 (two) times daily for 7 days. 09/23/18 09/30/18  Lorin Glass,  PA-C    Family History Family History  Problem Relation Age of Onset  . Hypertension Mother   . Stroke Mother   . Heart disease Mother   . Aneurysm Mother   . CVA Mother   . Hypertension Father   . Stroke Father   . Heart attack Father   . Alcohol abuse Father   . Hypertension Sister   . Hypertension Brother   . Colon cancer Brother   . Cancer Brother   . Diabetes Sister   . Diabetes Brother   . Heart attack Brother   . Heart attack Brother   . Heart disease Sister   . Alcohol abuse Brother   . Heart disease Daughter     Social History Social History   Tobacco Use  . Smoking status: Former Smoker    Packs/day: 0.10    Years: 50.00    Pack years: 5.00    Types: Cigarettes    Last attempt to quit: 08/16/2011    Years since quitting: 7.1  . Smokeless tobacco: Never Used  . Tobacco comment: quit 2012  Substance Use Topics  . Alcohol use: No    Comment: quit 08/16/2011  . Drug use: No     Allergies   Ace inhibitors and Other   Review of Systems Review of Systems  Constitutional: Negative for chills and fever.  HENT: Positive for congestion, postnasal drip, rhinorrhea, sinus pressure and sinus pain. Negative for dental problem, drooling, ear discharge, ear pain, facial swelling, sore throat, trouble swallowing and voice change.   Respiratory: Positive for cough (Non productive), chest tightness and shortness of breath.   Cardiovascular: Positive for chest pain. Negative for palpitations and leg swelling.  Gastrointestinal: Negative for abdominal pain, diarrhea, nausea and vomiting.  Genitourinary: Negative for difficulty urinating.    Musculoskeletal: Negative for back pain, neck pain and neck stiffness.  Skin: Negative for color change and wound.  Neurological: Negative for dizziness, weakness and headaches.  All other systems reviewed and are negative.    Physical Exam Updated Vital Signs BP 136/80   Pulse 70   Temp 98.6 F (37 C) (Oral)   Resp 19   SpO2 93%   Physical Exam  Constitutional: She is oriented to person, place, and time. She appears well-developed and well-nourished.  Non-toxic appearance. No distress.  HENT:  Head: Normocephalic and atraumatic.  Mouth/Throat: Oropharynx is clear and moist. No posterior oropharyngeal edema.  Eyes: Conjunctivae are normal.  Neck: Normal range of motion. Neck supple. No tracheal deviation present.  Trach in place, no abnormal exudate or leaking from ostomy.   Cardiovascular: Normal rate and regular rhythm.  No murmur heard. Pulmonary/Chest: Effort normal. No respiratory distress. She has wheezes (Mild, diffuse). She exhibits no mass, no tenderness, no laceration and no crepitus.  Abdominal: Soft. Bowel sounds are normal. She exhibits no distension. There is no tenderness.  Musculoskeletal: Normal range of motion. She exhibits no edema.       Right lower leg: Normal. She exhibits no tenderness and no edema.       Left lower leg: Normal. She exhibits no tenderness and no edema.  Neurological: She is alert and oriented to person, place, and time.  Skin: Skin is warm and dry.  Psychiatric: She has a normal mood and affect.  Nursing note and vitals reviewed.    ED Treatments / Results  Labs (all labs ordered are listed, but only abnormal results are displayed) Labs Reviewed  BASIC METABOLIC PANEL - Abnormal;  Notable for the following components:      Result Value   Glucose, Bld 106 (*)    BUN 36 (*)    Creatinine, Ser 1.68 (*)    GFR calc non Af Amer 29 (*)    GFR calc Af Amer 33 (*)    All other components within normal limits  CBC - Abnormal; Notable  for the following components:   RBC 3.47 (*)    Hemoglobin 8.9 (*)    HCT 31.5 (*)    MCH 25.6 (*)    MCHC 28.3 (*)    RDW 16.5 (*)    nRBC 0.8 (*)    All other components within normal limits  BRAIN NATRIURETIC PEPTIDE - Abnormal; Notable for the following components:   B Natriuretic Peptide 533.1 (*)    All other components within normal limits  I-STAT TROPONIN, ED - Abnormal; Notable for the following components:   Troponin i, poc 0.15 (*)    All other components within normal limits  I-STAT TROPONIN, ED - Abnormal; Notable for the following components:   Troponin i, poc 0.19 (*)    All other components within normal limits  I-STAT TROPONIN, ED    EKG EKG Interpretation  Date/Time:  Wednesday September 22 2018 22:05:27 EDT Ventricular Rate:  70 PR Interval:    QRS Duration: 206 QT Interval:  504 QTC Calculation: 544 R Axis:   -25 Text Interpretation:  VENTRICULAR PACED RHYTHM Abnormal ECG No significant change since last tracing Confirmed by Pryor Curia [phone]) on 09/22/2018 11:40:28 PM   Radiology Dg Chest 2 View  Result Date: 09/21/2018 CLINICAL DATA:  Cough and COPD exacerbation EXAM: CHEST - 2 VIEW COMPARISON:  06/21/2018 FINDINGS: Tracheostomy tube is noted in satisfactory position. Pacing device is again seen and stable. Cardiac shadow remains enlarged. The lungs are clear. No focal infiltrate or sizable effusion is noted. No bony abnormality is noted. IMPRESSION: No active cardiopulmonary disease. Electronically Signed   By: Inez Catalina M.D.   On: 09/21/2018 16:35    Procedures Procedures (including critical care time)  Medications Ordered in ED Medications  doxycycline (VIBRA-TABS) tablet 100 mg (has no administration in time range)  ipratropium-albuterol (DUONEB) 0.5-2.5 (3) MG/3ML nebulizer solution 3 mL (3 mLs Nebulization Given 09/23/18 0043)     Initial Impression / Assessment and Plan / ED Course  I have reviewed the triage vital signs and the  nursing notes.  Pertinent labs & imaging results that were available during my care of the patient were reviewed by me and considered in my medical decision making (see chart for details).  Clinical Course as of Sep 23 318  Thu Sep 23, 2018  0300 Chronically elevated  Troponin i, poc(!!): 0.19 [EH]    Clinical Course User Index [EH] Lorin Glass, PA-C   Patient presents today for evaluation of chest pain and shortness of breath with associated URI-like symptoms for the past 2 weeks.  She was seen at her PCPs office yesterday, chest x-ray from then is available for review, was normal.  Her white count is not elevated.  Her creatinine, GFR, and hemoglobin all appear at her baseline level.  She had very mild wheezes diffusely.  She was given a DuoNeb, after which they improved and she reported feeling significantly better.  Her troponin is consistently elevated upon chart review, troponin x2 was essentially unchanged without significant elevation between the first and the second, appears consistent with her baseline.  Discussed with patient possible admission for  continued treatment, however she is adamant that she wishes to go home today and follow-up as an outpatient.  She has cardiology follow-up tomorrow morning and has a PCP for which close follow-up is available.  Her chest pain and shortness of breath do not appear to be an anginal equivalent.  PE was considered, however given her history of URI like symptoms with gradual nature PE is not consistent with her illness course.  Given that she has had URI-like symptoms consistent with sinusitis for more than 10 days concern for bacterial secondary infection but mostly started as a viral sinusitis contributing to her symptoms.  Her QTC is slightly prolonged, again her baseline, on her EKG, therefore will treat with doxycycline instead of azithromycin.  This patient was seen as a shared visit with Dr. Leonides Schanz who evlauted the patient and voiced  agreeance with the plan.  Patient is already on prednisone, instructed to continue that and increase neb frequency to q4hprn.   Return precautions were discussed with patient who states their understanding.  At the time of discharge patient denied any unaddressed complaints or concerns.  Patient is agreeable for discharge home.   Final Clinical Impressions(s) / ED Diagnoses   Final diagnoses:  Shortness of breath  Acute sinusitis, recurrence not specified, unspecified location    ED Discharge Orders         Ordered    doxycycline (VIBRAMYCIN) 100 MG capsule  2 times daily     09/23/18 0309           Lorin Glass, [place] 09/23/18 [phone]

## 2018-09-23 NOTE — Progress Notes (Signed)
PCP: Steve Rattler, DO Primary Cardiologist: Dr Oval Linsey Primary EP:  Dr Rayann Heman  [name] is a 72 y.o. female who presents today for routine electrophysiology followup.  Since last being seen in our clinic, the patient reports doing reasonably well.  She continues to have issues with tracheal stenosis.  Today, she denies symptoms of palpitations, chest pain,   lower extremity edema, dizziness, presyncope, or syncope.  The patient is otherwise without complaint today.   Past Medical History:  Diagnosis Date  . Anemia   . Angina   . Angioedema    2/2 ACE  . Arteriovenous malformation of stomach   . Arthritis   . Asthma   . AVM (arteriovenous malformation) of colon    small intestine; stomach  . Blood transfusion   . CHF (congestive heart failure) ([place])   . Complete heart block ([place])    s/p PPM 1998  . Complication of anesthesia    Difficult airway; anaphylaxis and swelling with propofol  . DDD (degenerative disc disease)   . Depression   . Diastolic heart failure   . Fatty liver 07/26/10  . GERD (gastroesophageal reflux disease)   . GI bleed   . Headache   . History of alcohol abuse Stopped Fall 2012  . History of tobacco use Quit Fall 2012  . Hx of cardiovascular stress test    a. Lexiscan Myoview (10/15):  Small inferolateral and apical defect c/w scar and poss soft tissue attenuation, no ischemia, EF 42%  . Hx of colonic polyp 08/13/10   adenomatous  . Hx of colonoscopy   . Hyperlipidemia   . Hyperlipidemia   . Hypertension   . Hypertrophic cardiomyopathy ([place])    dx by Dr [name] 2009  . Iron deficiency anemia   . Myocardial infarction ([place])   . Panic attack   . Panic attacks   . Permanent atrial fibrillation   . Pneumonia   . Renal failure    baseline creatinine 1.6  . Right arm pain 01/08/2012  . RLS (restless legs syndrome)    Dx 06/2007  . Shortness of breath    sob on exertation  . Sleep apnea    Past Surgical History:  Procedure  Laterality Date  . CARDIAC CATHETERIZATION    . CARDIAC CATHETERIZATION N/A 08/19/2016   Procedure: Right/Left Heart Cath and Coronary Angiography;  Surgeon: Jolaine Artist, MD;  Location: [facility];  Service: Cardiovascular;  Laterality: N/A;  . COLONOSCOPY WITH PROPOFOL N/A 06/16/2017   Procedure: COLONOSCOPY WITH PROPOFOL;  Surgeon: Mauri Pole, MD;  Location: [facility];  Service: Endoscopy;  Laterality: N/A;  . DIRECT LARYNGOSCOPY N/A 09/18/2016   Procedure: DIRECT LARYNGOSCOPY, BRONCHOSCOPY, REMOVAL OF INTUBATION GRANULOMA;  Surgeon: [name], MD;  Location: [facility] OR;  Service: ENT;  Laterality: N/A;  . DIRECT LARYNGOSCOPY N/A 10/20/2016   Procedure: EXTUBATION AND FLEXIBLE LARYNGOSCOPE;  Surgeon: [name], MD;  Location: [place];  Service: ENT;  Laterality: N/A;  . DIRECT LARYNGOSCOPY N/A 10/29/2016   Procedure: DIRECT LARYNGOSCOPY;  Surgeon: [name], MD;  Location: [facility] OR;  Service: ENT;  Laterality: N/A;  . ESOPHAGOGASTRODUODENOSCOPY  12/23/2011   Procedure: ESOPHAGOGASTRODUODENOSCOPY (EGD);  Surgeon: [name], MD;  Location: [facility];  Service: Endoscopy;  Laterality: N/A;  . ESOPHAGOGASTRODUODENOSCOPY (EGD) WITH PROPOFOL N/A 06/16/2017   Procedure: ESOPHAGOGASTRODUODENOSCOPY (EGD) WITH PROPOFOL;  Surgeon: Mauri Pole, MD;  Location: [facility];  Service: Endoscopy;  Laterality: N/A;  . EXTUBATION (ENDOTRACHEAL) IN  OR N/A 07/21/2016   Procedure: EXTUBATION (ENDOTRACHEAL) IN OR;  Surgeon: [name], MD;  Location: [place];  Service: ENT;  Laterality: N/A;  . GIVENS CAPSULE STUDY  12/23/2011   Procedure: GIVENS CAPSULE STUDY;  Surgeon: [name], MD;  Location: [facility];  Service: Endoscopy;  Laterality: N/A;  . MICROLARYNGOSCOPY WITH LASER N/A 10/16/2016   Procedure: MICRODIRECTLARYNGOSCOPY WITH LASER ABLATION AND KENLOG INJECTION;  Surgeon: [name], MD;  Location: [place];  Service: ENT;  Laterality: N/A;  . PACEMAKER INSERTION  1998     st jude, most recent gen change by [name] 4/12  . TRACHEOSTOMY TUBE PLACEMENT N/A 10/29/2016   Procedure: TRACHEOSTOMY;  Surgeon: [name], MD;  Location: [place];  Service: ENT;  Laterality: N/A;  . TUBAL LIGATION  04/01/2000    ROS- all systems are reviewed and negative except as per HPI above  Current Outpatient Medications  Medication Sig Dispense Refill  . acetaminophen (TYLENOL) 325 MG tablet Take 325-650 mg by mouth every 6 (six) hours as needed for mild pain, fever or headache.     . albuterol (PROVENTIL HFA;VENTOLIN HFA) 108 (90 Base) MCG/ACT inhaler Inhale 1-2 puffs into the lungs every 6 (six) hours as needed for wheezing or shortness of breath. 1 Inhaler 6  . allopurinol (ZYLOPRIM) 100 MG tablet Take 2 tablets (200 mg total) by mouth daily. 180 tablet 3  . ALPRAZolam (XANAX) 0.5 MG tablet Take 1 tablet (0.5 mg total) by mouth daily as needed for anxiety. 90 tablet 2  . aspirin EC 81 MG tablet Take 81 mg by mouth daily.     . cetirizine (ZYRTEC) 10 MG tablet Take 10 mg by mouth daily.    [name] doxycycline (VIBRAMYCIN) 100 MG capsule Take 1 capsule (100 mg total) by mouth 2 (two) times daily for 7 days. 14 capsule 0  . ferrous sulfate 325 (65 FE) MG tablet TAKE 1 TABLET BY MOUTH TWICE DAILY WITH MEALS TO KEEP BLOOD COUNT UP (Patient taking differently: Take 325 mg by mouth 2 (two) times daily with a meal. ) 60 tablet 11  . furosemide (LASIX) 40 MG tablet Take 1 tablet (40 mg total) by mouth 2 (two) times daily. 180 tablet 3  . guaiFENesin 300 MG/15ML SOLN Take 300 mg by mouth 2 (two) times daily. 1000 mL 3  . ipratropium-albuterol (DUONEB) 0.5-2.5 (3) MG/3ML SOLN USE 3 ML VIA NEBULIZER EVERY 6 HOURS (Patient taking differently: Inhale 3 mLs into the lungs every 6 (six) hours. ) 1080 mL 0  . latanoprost (XALATAN) 0.005 % ophthalmic solution Place one drop in both eyes at bedtime.  3  . magnesium hydroxide (MILK OF MAGNESIA) 400 MG/5ML suspension Take 60 mLs by mouth daily as needed for  mild constipation.     . metoprolol tartrate (LOPRESSOR) 25 MG tablet TAKE 1 TABLET BY MOUTH EVERY 6 HOURS AS NEEDED FOR HEART FLUTTERING (Patient taking differently: Take 25 mg by mouth every 6 (six) hours as needed (heart fluttering). Take 1 tablet by mouth every 6 hours as needed for heart fluttering) 360 tablet 3  . nitroGLYCERIN (NITROSTAT) 0.4 MG SL tablet Place 1 tablet (0.4 mg total) under the tongue every 5 (five) minutes as needed for chest pain. 25 tablet 1  . Olopatadine HCl 0.2 % SOLN Place one to two drops in each once daily as needed for itching.  1  . Phenylephrine-DM-GG (ROBAFEN CF MULTI-SYMPTOM COLD) 5-10-100 MG/5ML LIQD Take 10 mLs by mouth daily as needed (cold).     [name]  polyethylene glycol (MIRALAX / GLYCOLAX) packet Take 17 g by mouth daily as needed for mild constipation.     . potassium chloride (K-DUR) 10 MEQ tablet Take 1 tablet (10 mEq total) by mouth daily. 90 tablet 3  . predniSONE (DELTASONE) 1 MG tablet Take 7 mg by mouth daily with breakfast.     . ranitidine (ZANTAC) 150 MG tablet Take 1 tablet (150 mg total) by mouth daily. 15 tablet 0  . rosuvastatin (CRESTOR) 20 MG tablet Take 1 tablet (20 mg total) by mouth daily. 90 tablet 3  . senna (SENOKOT) 8.6 MG TABS tablet Take 1 tablet by mouth daily as needed for mild constipation.    . sodium chloride (OCEAN) 0.65 % SOLN nasal spray Place 1 spray into both nostrils daily as needed for congestion.     . triamcinolone cream (KENALOG) 0.1 % Apply 1 application topically 2 (two) times daily. (Patient taking differently: Apply 1 application topically 2 (two) times daily as needed (rash). ) 30 g 2   No current facility-administered medications for this visit.     Physical Exam: Vitals:   09/23/18 1113  BP: 126/76  Pulse: 70  SpO2: 97%  Weight: 171 lb 12.8 oz (77.9 kg)  Height: 5\' 5"  (1.651 m)    GEN- The patient is well appearing, alert and oriented x 3 today.   Head- normocephalic, atraumatic Eyes-  Sclera clear,  conjunctiva pink Ears- hearing intact Oropharynx- clear Lungs- Clear to ausculation bilaterally, normal work of breathing Chest- pacemaker pocket is well healed Heart- Regular rate and rhythm, no murmurs, rubs or gallops, PMI not laterally displaced GI- soft, NT, ND, + BS Extremities- no clubbing, cyanosis, or edema  Pacemaker interrogation- reviewed in detail today,  See PACEART report  ekg tracing ordered today is personally reviewed and shows afib, V pacing  Assessment and Plan:  1. Symptomatic complete heart block Normal pacemaker function See Pace Art report No changes today  2. Permanent afib Not a candidate for anticoagulation due to GI bleeding  3. HTN Stable No change required today  4. Chronic atypical chest pain Normal cors by cath  5. Chronic diastolic dysfunction/ HCM Stable EF 65% No change required today  Return to see EP NP in a year Follow-up with Dr Oval Linsey as scheduled  Thompson Grayer MD, [facility] 09/23/2018 11:37 AM

## 2018-09-24 DIAGNOSIS — H40023 Open angle with borderline findings, high risk, bilateral: Secondary | ICD-10-CM | POA: Diagnosis not present

## 2018-09-24 DIAGNOSIS — H40053 Ocular hypertension, bilateral: Secondary | ICD-10-CM | POA: Diagnosis not present

## 2018-09-24 DIAGNOSIS — H35033 Hypertensive retinopathy, bilateral: Secondary | ICD-10-CM | POA: Diagnosis not present

## 2018-09-24 NOTE — Telephone Encounter (Signed)
LM for patient ok per DPR that xray was normal.  Alexa Golebiewski,CMA

## 2018-10-06 DIAGNOSIS — Z430 Encounter for attention to tracheostomy: Secondary | ICD-10-CM | POA: Diagnosis not present

## 2018-10-06 DIAGNOSIS — G4733 Obstructive sleep apnea (adult) (pediatric): Secondary | ICD-10-CM | POA: Diagnosis not present

## 2018-10-06 DIAGNOSIS — J386 Stenosis of larynx: Secondary | ICD-10-CM | POA: Diagnosis not present

## 2018-10-11 DIAGNOSIS — M1A09X0 Idiopathic chronic gout, multiple sites, without tophus (tophi): Secondary | ICD-10-CM | POA: Diagnosis not present

## 2018-10-11 DIAGNOSIS — M2550 Pain in unspecified joint: Secondary | ICD-10-CM | POA: Diagnosis not present

## 2018-10-11 DIAGNOSIS — M353 Polymyalgia rheumatica: Secondary | ICD-10-CM | POA: Diagnosis not present

## 2018-10-19 ENCOUNTER — Ambulatory Visit (HOSPITAL_COMMUNITY)
Admission: RE | Admit: 2018-10-19 | Discharge: 2018-10-19 | Disposition: A | Discharge status: Home / Self Care | Payer: Medicare Other | Source: Ambulatory Visit | Attending: Nephrology | Admitting: Nephrology

## 2018-10-19 VITALS — BP 136/78 | HR 70 | Temp 97.8°F | Resp 20

## 2018-10-19 DIAGNOSIS — N183 Chronic kidney disease, stage 3 unspecified: Secondary | ICD-10-CM

## 2018-10-19 LAB — IRON AND TIBC
IRON: 43 ug/dL (ref 28–170)
SATURATION RATIOS: 11 % (ref 10.4–31.8)
TIBC: 379 ug/dL (ref 250–450)
UIBC: 336 ug/dL

## 2018-10-19 LAB — POCT HEMOGLOBIN-HEMACUE: Hemoglobin: 10.6 g/dL — ABNORMAL LOW (ref 12.0–15.0)

## 2018-10-19 LAB — FERRITIN: Ferritin: 275 ng/mL (ref 11–307)

## 2018-10-19 MED ORDER — EPOETIN ALFA-EPBX 10000 UNIT/ML IJ SOLN
10000.0000 [IU] | INTRAMUSCULAR | Status: DC
  Administered 2018-10-19: 10000 [IU] via SUBCUTANEOUS
  Filled 2018-10-19: qty 1

## 2018-10-20 ENCOUNTER — Other Ambulatory Visit: Payer: Self-pay | Admitting: Internal Medicine

## 2018-10-20 LAB — CUP PACEART INCLINIC DEVICE CHECK
Battery Voltage: 2.78 V
Brady Statistic RV Percent Paced: 99 %
Date Time Interrogation Session: 20191106140419
Implantable Lead Implant Date: 19860725
Implantable Lead Location: 753860
Lead Channel Impedance Value: 429 Ohm
Lead Channel Setting Pacing Pulse Width: 0.5 ms
Lead Channel Setting Sensing Sensitivity: 8 mV
MDC IDC LEAD IMPLANT DT: 19860725
MDC IDC LEAD LOCATION: 753859
MDC IDC LEAD SERIAL: 652511
MDC IDC MSMT BATTERY REMAINING LONGEVITY: 78 mo
MDC IDC MSMT LEADCHNL RV PACING THRESHOLD AMPLITUDE: 1 V
MDC IDC MSMT LEADCHNL RV PACING THRESHOLD PULSEWIDTH: 0.5 ms
MDC IDC PG IMPLANT DT: 20120424
MDC IDC SET LEADCHNL RV PACING AMPLITUDE: 2.5 V
Pulse Gen Serial Number: 2406848

## 2018-10-21 ENCOUNTER — Other Ambulatory Visit: Payer: Self-pay | Admitting: Family Medicine

## 2018-10-21 DIAGNOSIS — J449 Chronic obstructive pulmonary disease, unspecified: Secondary | ICD-10-CM

## 2018-10-25 DIAGNOSIS — Z930 Tracheostomy status: Secondary | ICD-10-CM | POA: Diagnosis not present

## 2018-10-25 DIAGNOSIS — J398 Other specified diseases of upper respiratory tract: Secondary | ICD-10-CM | POA: Diagnosis not present

## 2018-10-25 DIAGNOSIS — D649 Anemia, unspecified: Secondary | ICD-10-CM | POA: Diagnosis not present

## 2018-10-25 DIAGNOSIS — I4891 Unspecified atrial fibrillation: Secondary | ICD-10-CM | POA: Diagnosis not present

## 2018-10-25 DIAGNOSIS — J449 Chronic obstructive pulmonary disease, unspecified: Secondary | ICD-10-CM | POA: Diagnosis not present

## 2018-11-05 DIAGNOSIS — H40023 Open angle with borderline findings, high risk, bilateral: Secondary | ICD-10-CM | POA: Diagnosis not present

## 2018-11-05 DIAGNOSIS — H40053 Ocular hypertension, bilateral: Secondary | ICD-10-CM | POA: Diagnosis not present

## 2018-11-16 ENCOUNTER — Ambulatory Visit (HOSPITAL_COMMUNITY)
Admission: RE | Admit: 2018-11-16 | Discharge: 2018-11-16 | Disposition: A | Discharge status: Home / Self Care | Payer: Medicare Other | Source: Ambulatory Visit | Attending: Nephrology | Admitting: Nephrology

## 2018-11-16 VITALS — BP 115/73 | HR 70 | Temp 97.7°F | Resp 20

## 2018-11-16 DIAGNOSIS — N183 Chronic kidney disease, stage 3 unspecified: Secondary | ICD-10-CM

## 2018-11-16 DIAGNOSIS — D631 Anemia in chronic kidney disease: Secondary | ICD-10-CM | POA: Insufficient documentation

## 2018-11-16 DIAGNOSIS — N184 Chronic kidney disease, stage 4 (severe): Secondary | ICD-10-CM | POA: Insufficient documentation

## 2018-11-16 LAB — IRON AND TIBC
Iron: 58 ug/dL (ref 28–170)
Saturation Ratios: 17 % (ref 10.4–31.8)
TIBC: 333 ug/dL (ref 250–450)
UIBC: 275 ug/dL

## 2018-11-16 LAB — POCT HEMOGLOBIN-HEMACUE: Hemoglobin: 9.6 g/dL — ABNORMAL LOW (ref 12.0–15.0)

## 2018-11-16 LAB — FERRITIN: Ferritin: 118 ng/mL (ref 11–307)

## 2018-11-16 MED ORDER — EPOETIN ALFA-EPBX 10000 UNIT/ML IJ SOLN
10000.0000 [IU] | INTRAMUSCULAR | Status: DC
  Filled 2018-11-16: qty 1

## 2018-11-17 DIAGNOSIS — J386 Stenosis of larynx: Secondary | ICD-10-CM | POA: Diagnosis not present

## 2018-11-17 DIAGNOSIS — Z430 Encounter for attention to tracheostomy: Secondary | ICD-10-CM | POA: Diagnosis not present

## 2018-11-17 DIAGNOSIS — G4733 Obstructive sleep apnea (adult) (pediatric): Secondary | ICD-10-CM | POA: Diagnosis not present

## 2018-11-17 DIAGNOSIS — J40 Bronchitis, not specified as acute or chronic: Secondary | ICD-10-CM | POA: Diagnosis not present

## 2018-11-24 DIAGNOSIS — N184 Chronic kidney disease, stage 4 (severe): Secondary | ICD-10-CM | POA: Diagnosis not present

## 2018-11-26 ENCOUNTER — Ambulatory Visit (INDEPENDENT_AMBULATORY_CARE_PROVIDER_SITE_OTHER): Payer: Medicare Other | Admitting: Family Medicine

## 2018-11-26 ENCOUNTER — Other Ambulatory Visit: Payer: Self-pay

## 2018-11-26 ENCOUNTER — Encounter: Payer: Self-pay | Admitting: Family Medicine

## 2018-11-26 VITALS — BP 138/61 | HR 69 | Temp 97.9°F | Wt 177.0 lb

## 2018-11-26 DIAGNOSIS — F418 Other specified anxiety disorders: Secondary | ICD-10-CM

## 2018-11-26 DIAGNOSIS — K5904 Chronic idiopathic constipation: Secondary | ICD-10-CM

## 2018-11-26 DIAGNOSIS — R7309 Other abnormal glucose: Secondary | ICD-10-CM

## 2018-11-26 DIAGNOSIS — Z23 Encounter for immunization: Secondary | ICD-10-CM | POA: Diagnosis not present

## 2018-11-26 LAB — POCT GLYCOSYLATED HEMOGLOBIN (HGB A1C): HbA1c, POC (controlled diabetic range): 6.4 % (ref 0.0–7.0)

## 2018-11-26 MED ORDER — ALPRAZOLAM 0.5 MG PO TABS: 0.5000 mg | ORAL_TABLET | Freq: Every day | ORAL | 2 refills | Status: DC | PRN

## 2018-11-26 MED ORDER — POLYETHYLENE GLYCOL 3350 17 G PO PACK: 17.0000 g | PACK | Freq: Every day | ORAL | 1 refills | Status: DC | PRN

## 2018-11-26 NOTE — Assessment & Plan Note (Signed)
  Hx of pre-diabetes and patient on chronic steroid. A1C today <6.5. continue to monitor.

## 2018-11-26 NOTE — Progress Notes (Signed)
    Subjective:    Patient ID: [name], female    DOB: 1946-03-05, 72 y.o.   MRN: [id number]   CC: follow up   HPI: Patient is here today for flu shot and to follow up on several issues.  Concerns: how much senna to use, how much decongestant to use, and worried she has pneumonia  She was given a zpack last week by ENT. She reports she completed this course. Reports feeling of sputum stuck in her throat. This is a chronic issue and distressing for her. No SOB, no fevers or chills.   On 7 mg prednisone daily per rheum now Doing well over all. [facility] nurse comes out to house to check on her.  Smoking status reviewed- former smoker  Review of Systems- see HPI   Objective:  BP 138/61   Pulse 69   Temp 97.9 F (36.6 C) (Oral)   Wt 177 lb (80.3 kg)   SpO2 100%   BMI 29.45 kg/m  Vitals and nursing note reviewed  General: well nourished, in no acute distress HEENT: normocephalic, MMM Neck: trach in place Cardiac: RRR, clear S1 and S2, no murmurs, rubs, or gallops Respiratory: clear to auscultation bilaterally, no increased work of breathing Extremities: no edema or cyanosis Neuro: alert and oriented, no focal deficits   Assessment & Plan:    Depression with anxiety  Refilled xanax, patient uses this sparingly.   Chronic idiopathic constipation  Patient taking senna every night and drinking prune juice. She has stopped miralax and is no longer having daily BM. Advised her to restart miralax. rx sent in.  Other abnormal glucose  Hx of pre-diabetes and patient on chronic steroid. A1C today <6.5. continue to monitor.   Flu shot given today  Return in about 3 months (around 02/25/2019), or as needed.   Lucila Maine, DO Family Medicine Resident PGY-3

## 2018-11-26 NOTE — Patient Instructions (Signed)
  Great to see you today! I am not making any medication changes.  Your sugar is in normal range.  I sent in miralax prescription to pharmacy please take 1 capful a day for diarrhea.  If you have questions or concerns please do not hesitate to call at [phone].  Lucila Maine, DO PGY-3, [facility] 11/26/2018 2:37 PM

## 2018-11-26 NOTE — Assessment & Plan Note (Signed)
  Patient taking senna every night and drinking prune juice. She has stopped miralax and is no longer having daily BM. Advised her to restart miralax. rx sent in.

## 2018-11-26 NOTE — Assessment & Plan Note (Signed)
  Refilled xanax, patient uses this sparingly.

## 2018-12-02 DIAGNOSIS — N184 Chronic kidney disease, stage 4 (severe): Secondary | ICD-10-CM | POA: Diagnosis not present

## 2018-12-02 DIAGNOSIS — D631 Anemia in chronic kidney disease: Secondary | ICD-10-CM | POA: Diagnosis not present

## 2018-12-02 DIAGNOSIS — I129 Hypertensive chronic kidney disease with stage 1 through stage 4 chronic kidney disease, or unspecified chronic kidney disease: Secondary | ICD-10-CM | POA: Diagnosis not present

## 2018-12-02 DIAGNOSIS — N2581 Secondary hyperparathyroidism of renal origin: Secondary | ICD-10-CM | POA: Diagnosis not present

## 2018-12-06 ENCOUNTER — Other Ambulatory Visit (HOSPITAL_COMMUNITY): Payer: Self-pay | Admitting: *Deleted

## 2018-12-07 ENCOUNTER — Ambulatory Visit (HOSPITAL_COMMUNITY)
Admission: RE | Admit: 2018-12-07 | Discharge: 2018-12-07 | Disposition: A | Discharge status: Home / Self Care | Payer: Medicare Other | Source: Ambulatory Visit | Attending: Nephrology | Admitting: Nephrology

## 2018-12-07 DIAGNOSIS — N183 Chronic kidney disease, stage 3 (moderate): Secondary | ICD-10-CM | POA: Diagnosis not present

## 2018-12-07 MED ORDER — SODIUM CHLORIDE 0.9 % IV SOLN
510.0000 mg | INTRAVENOUS | Status: DC
  Administered 2018-12-07: 510 mg via INTRAVENOUS
  Filled 2018-12-07: qty 510

## 2018-12-10 ENCOUNTER — Emergency Department (HOSPITAL_COMMUNITY)
Admission: EM | Admit: 2018-12-10 | Discharge: 2018-12-10 | Disposition: A | Discharge status: Home / Self Care | Payer: Medicare Other | Attending: Emergency Medicine | Admitting: Emergency Medicine

## 2018-12-10 ENCOUNTER — Emergency Department (HOSPITAL_COMMUNITY): Payer: Medicare Other

## 2018-12-10 ENCOUNTER — Encounter (HOSPITAL_COMMUNITY): Payer: Self-pay

## 2018-12-10 ENCOUNTER — Other Ambulatory Visit: Payer: Self-pay

## 2018-12-10 DIAGNOSIS — Z79899 Other long term (current) drug therapy: Secondary | ICD-10-CM | POA: Insufficient documentation

## 2018-12-10 DIAGNOSIS — I5032 Chronic diastolic (congestive) heart failure: Secondary | ICD-10-CM | POA: Diagnosis not present

## 2018-12-10 DIAGNOSIS — N183 Chronic kidney disease, stage 3 (moderate): Secondary | ICD-10-CM | POA: Insufficient documentation

## 2018-12-10 DIAGNOSIS — J069 Acute upper respiratory infection, unspecified: Secondary | ICD-10-CM | POA: Diagnosis not present

## 2018-12-10 DIAGNOSIS — R05 Cough: Secondary | ICD-10-CM | POA: Diagnosis not present

## 2018-12-10 DIAGNOSIS — R0602 Shortness of breath: Secondary | ICD-10-CM | POA: Insufficient documentation

## 2018-12-10 DIAGNOSIS — R059 Cough, unspecified: Secondary | ICD-10-CM

## 2018-12-10 DIAGNOSIS — I129 Hypertensive chronic kidney disease with stage 1 through stage 4 chronic kidney disease, or unspecified chronic kidney disease: Secondary | ICD-10-CM | POA: Insufficient documentation

## 2018-12-10 DIAGNOSIS — J449 Chronic obstructive pulmonary disease, unspecified: Secondary | ICD-10-CM

## 2018-12-10 DIAGNOSIS — Z930 Tracheostomy status: Secondary | ICD-10-CM

## 2018-12-10 DIAGNOSIS — Z87891 Personal history of nicotine dependence: Secondary | ICD-10-CM | POA: Diagnosis not present

## 2018-12-10 LAB — COMPREHENSIVE METABOLIC PANEL
ALT: 16 U/L (ref 0–44)
AST: 23 U/L (ref 15–41)
Albumin: 3.7 g/dL (ref 3.5–5.0)
Alkaline Phosphatase: 45 U/L (ref 38–126)
Anion gap: 9 (ref 5–15)
BUN: 49 mg/dL — ABNORMAL HIGH (ref 8–23)
CHLORIDE: 100 mmol/L (ref 98–111)
CO2: 32 mmol/L (ref 22–32)
Calcium: 9.9 mg/dL (ref 8.9–10.3)
Creatinine, Ser: 1.8 mg/dL — ABNORMAL HIGH (ref 0.44–1.00)
GFR calc Af Amer: 32 mL/min — ABNORMAL LOW (ref >60)
GFR calc non Af Amer: 27 mL/min — ABNORMAL LOW (ref >60)
Glucose, Bld: 124 mg/dL — ABNORMAL HIGH (ref 70–99)
Potassium: 4 mmol/L (ref 3.5–5.1)
Sodium: 141 mmol/L (ref 135–145)
Total Bilirubin: 0.9 mg/dL (ref 0.3–1.2)
Total Protein: 6.5 g/dL (ref 6.5–8.1)

## 2018-12-10 LAB — CBC WITH DIFFERENTIAL/PLATELET
Abs Immature Granulocytes: 0.04 10*3/uL (ref 0.00–0.07)
Basophils Absolute: 0 10*3/uL (ref 0.0–0.1)
Basophils Relative: 0 %
Eosinophils Absolute: 0.2 10*3/uL (ref 0.0–0.5)
Eosinophils Relative: 2 %
HCT: 32.9 % — ABNORMAL LOW (ref 36.0–46.0)
Hemoglobin: 9.4 g/dL — ABNORMAL LOW (ref 12.0–15.0)
Immature Granulocytes: 1 %
LYMPHS ABS: 0.9 10*3/uL (ref 0.7–4.0)
Lymphocytes Relative: 10 %
MCH: 26.9 pg (ref 26.0–34.0)
MCHC: 28.6 g/dL — ABNORMAL LOW (ref 30.0–36.0)
MCV: 94.3 fL (ref 80.0–100.0)
MONOS PCT: 10 %
Monocytes Absolute: 0.9 10*3/uL (ref 0.1–1.0)
Neutro Abs: 6.5 10*3/uL (ref 1.7–7.7)
Neutrophils Relative %: 77 %
Platelets: 233 10*3/uL (ref 150–400)
RBC: 3.49 MIL/uL — ABNORMAL LOW (ref 3.87–5.11)
RDW: 17.2 % — ABNORMAL HIGH (ref 11.5–15.5)
WBC: 8.5 10*3/uL (ref 4.0–10.5)
nRBC: 0.2 % (ref 0.0–0.2)

## 2018-12-10 MED ORDER — PREDNISONE 20 MG PO TABS
60.0000 mg | ORAL_TABLET | Freq: Once | ORAL | Status: AC
  Administered 2018-12-10: 60 mg via ORAL
  Filled 2018-12-10: qty 3

## 2018-12-10 MED ORDER — AZITHROMYCIN 250 MG PO TABS: 250.0000 mg | ORAL_TABLET | Freq: Every day | ORAL | 0 refills | Status: DC

## 2018-12-10 MED ORDER — BENZONATATE 100 MG PO CAPS: 100.0000 mg | ORAL_CAPSULE | Freq: Three times a day (TID) | ORAL | 0 refills | Status: DC | PRN

## 2018-12-10 MED ORDER — ALBUTEROL SULFATE (2.5 MG/3ML) 0.083% IN NEBU
INHALATION_SOLUTION | RESPIRATORY_TRACT | Status: AC
  Administered 2018-12-10: 2.5 mg
  Filled 2018-12-10: qty 3

## 2018-12-10 MED ORDER — IPRATROPIUM-ALBUTEROL 0.5-2.5 (3) MG/3ML IN SOLN
3.0000 mL | Freq: Once | RESPIRATORY_TRACT | Status: AC
  Administered 2018-12-10: 3 mL via RESPIRATORY_TRACT
  Filled 2018-12-10: qty 3

## 2018-12-10 MED ORDER — PREDNISONE 20 MG PO TABS: ORAL_TABLET | ORAL | 0 refills | Status: DC

## 2018-12-10 MED ORDER — AZITHROMYCIN 250 MG PO TABS
500.0000 mg | ORAL_TABLET | Freq: Once | ORAL | Status: AC
  Administered 2018-12-10: 500 mg via ORAL
  Filled 2018-12-10: qty 2

## 2018-12-10 NOTE — ED Provider Notes (Signed)
[facility] EMERGENCY DEPARTMENT Provider Note   CSN: [id number] Arrival date & time: 12/10/18  1017     History   Chief Complaint Chief Complaint  Patient presents with  . URI  . Shortness of Breath    HPI [name] is a 72 y.o. female.   URI   This is a recurrent problem. The current episode started more than 2 days ago. The problem has been gradually worsening. There has been no fever. Associated symptoms include cough. Pertinent negatives include no chest pain. She has tried nothing for the symptoms.  Shortness of Breath  Associated symptoms include cough. Pertinent negatives include no chest pain.  Cough  This is a recurrent problem. The cough is productive of sputum. Associated symptoms include shortness of breath. Pertinent negatives include no chest pain.    Past Medical History:  Diagnosis Date  . Anemia   . Angina   . Angioedema    2/2 ACE  . Arteriovenous malformation of stomach   . Arthritis   . Asthma   . AVM (arteriovenous malformation) of colon    small intestine; stomach  . Blood transfusion   . CHF (congestive heart failure) ([place])   . Complete heart block ([place])    s/p PPM 1998  . Complication of anesthesia    Difficult airway; anaphylaxis and swelling with propofol  . DDD (degenerative disc disease)   . Depression   . Diastolic heart failure   . Fatty liver 07/26/10  . GERD (gastroesophageal reflux disease)   . GI bleed   . Headache   . History of alcohol abuse Stopped Fall 2012  . History of tobacco use Quit Fall 2012  . Hx of cardiovascular stress test    a. Lexiscan Myoview (10/15):  Small inferolateral and apical defect c/w scar and poss soft tissue attenuation, no ischemia, EF 42%  . Hx of colonic polyp 08/13/10   adenomatous  . Hx of colonoscopy   . Hyperlipidemia   . Hyperlipidemia   . Hypertension   . Hypertrophic cardiomyopathy ([place])    dx by Dr [name] 2009  . Iron deficiency anemia   . Myocardial  infarction ([place])   . Panic attack   . Panic attacks   . Permanent atrial fibrillation   . Pneumonia   . Renal failure    baseline creatinine 1.6  . Right arm pain 01/08/2012  . RLS (restless legs syndrome)    Dx 06/2007  . Shortness of breath    sob on exertation  . Sleep apnea     Patient Active Problem List   Diagnosis Date Noted  . Other abnormal glucose 11/26/2018  . Polypharmacy 06/25/2018  . Depression with anxiety   . Tracheostomy in place [facility])   . Tracheostomy care ([place])   . Calculus of gallbladder without cholecystitis without obstruction 12/24/2017  . Chronic cough 09/25/2017  . History of colonic polyps   . Moderate protein-calorie malnutrition ([place]) 06/02/2017  . Chronic idiopathic constipation 03/19/2017  . 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 Permanent atrial fibrillation   . Laryngeal stenosis 09/18/2016  . Esophageal dysphagia   . Polymyalgia rheumatica ([place]) 04/10/2015  . Hypertrophic cardiomyopathy ([place]) 02/15/2015  . Eczema 09/18/2014  . Environmental allergies 09/18/2014  . Pre-diabetes 11/26/2013  . COPD, moderate ([place]) 11/01/2013  . Complete heart block ([place]) 04/03/2011  . Anemia 02/17/2011  . AVM (arteriovenous malformation) 02/13/2011  . PACEMAKER-[facility] 11/28/2010  . CKD (chronic kidney disease) stage 3, GFR 30-59 ml/min ([facility]) 09/24/2010  .  Acute on chronic congestive heart failure ([place]) 08/30/2010  . ARTHRITIS 07/24/2010  . Generalized anxiety disorder 07/23/2010  . Hyperlipidemia 02/11/2007  . Essential hypertension 02/11/2007  . APNEA, SLEEP 02/11/2007    Past Surgical History:  Procedure Laterality Date  . CARDIAC CATHETERIZATION    . CARDIAC CATHETERIZATION N/A 08/19/2016   Procedure: Right/Left Heart Cath and Coronary Angiography;  Surgeon: Jolaine Artist, MD;  Location: [facility] CV LAB;  Service: Cardiovascular;  Laterality: N/A;  . COLONOSCOPY WITH PROPOFOL N/A 06/16/2017   Procedure: COLONOSCOPY WITH PROPOFOL;  Surgeon:  Mauri Pole, MD;  Location: [facility] ENDOSCOPY;  Service: Endoscopy;  Laterality: N/A;  . DIRECT LARYNGOSCOPY N/A 09/18/2016   Procedure: DIRECT LARYNGOSCOPY, BRONCHOSCOPY, REMOVAL OF INTUBATION GRANULOMA;  Surgeon: [name], MD;  Location: [facility] [place] OR;  Service: ENT;  Laterality: N/A;  . DIRECT LARYNGOSCOPY N/A 10/20/2016   Procedure: EXTUBATION AND FLEXIBLE LARYNGOSCOPE;  Surgeon: [name], MD;  Location: [facility];  Service: ENT;  Laterality: N/A;  . DIRECT LARYNGOSCOPY N/A 10/29/2016   Procedure: DIRECT LARYNGOSCOPY;  Surgeon: [name], MD;  Location: [facility] OR;  Service: ENT;  Laterality: N/A;  . ESOPHAGOGASTRODUODENOSCOPY  12/23/2011   Procedure: ESOPHAGOGASTRODUODENOSCOPY (EGD);  Surgeon: [name], MD;  Location: [facility];  Service: Endoscopy;  Laterality: N/A;  . ESOPHAGOGASTRODUODENOSCOPY (EGD) WITH PROPOFOL N/A 06/16/2017   Procedure: ESOPHAGOGASTRODUODENOSCOPY (EGD) WITH PROPOFOL;  Surgeon: Mauri Pole, MD;  Location: [facility] ENDOS[facility];  Service: Endoscopy;  Laterality: N/A;  . EXTUBATION (ENDOTRACHEAL) IN OR N/A 07/21/2016   Procedure: EXTUBATION (ENDOTRACHEAL) IN OR;  Surgeon: [name], MD;  Location: [facility];  Service: ENT;  Laterality: N/A;  . GIVENS CAPSULE STUDY  12/23/2011   Procedure: GIVENS CAPSULE STUDY;  Surgeon: [name], MD;  Location: [facility] ENDOS[facility];  Service: Endoscopy;  Laterality: N/A;  . MICROLARYNGOSCOPY WITH LASER N/A 10/16/2016   Procedure: MICRODIRECTLARYNGOSCOPY WITH LASER ABLATION AND KENLOG INJECTION;  Surgeon: [name], MD;  Location: [facility];  Service: ENT;  Laterality: N/A;  . PACEMAKER INSERTION  1998   [facility], most recent gen change by [name] 4/12  . TRACHEOSTOMY TUBE PLACEMENT N/A 10/29/2016   Procedure: TRACHEOSTOMY;  Surgeon: [name], MD;  Location: [facility];  Service: ENT;  Laterality: N/A;  . TUBAL LIGATION  04/01/2000     OB History   No obstetric history on file.      Home Medications    Prior to Admission medications     Medication Sig Start Date End Date Taking? Authorizing Provider  acetaminophen (TYLENOL) 325 MG tablet Take 325-650 mg by mouth every 6 (six) hours as needed for mild pain, fever or headache.     [provider]  albuterol (PROVENTIL HFA;VENTOLIN HFA) 108 (90 Base) MCG/ACT inhaler Inhale 1-2 puffs into the lungs every 6 (six) hours as needed for wheezing or shortness of breath. 08/04/18   Riccio, Gardiner Rhyme, DO  allopurinol (ZYLOPRIM) 100 MG tablet Take 2 tablets (200 mg total) by mouth daily. 06/03/18   Zenia Resides, MD  ALPRAZolam Duanne Moron) 0.5 MG tablet Take 1 tablet (0.5 mg total) by mouth daily as needed for anxiety. 11/26/18   Steve Rattler, DO  aspirin EC 81 MG tablet Take 81 mg by mouth daily.     [provider]  azithromycin (ZITHROMAX) 250 MG tablet Take 1 tablet (250 mg total) by mouth daily. Take 1 every day until finished. 12/10/18   Anjolie Majer, Corene Cornea, MD  benzonatate (TESSALON) 100 MG capsule Take 1  capsule (100 mg total) by mouth 3 (three) times daily as needed for cough. 12/10/18   Kaytlyn Din, Corene Cornea, MD  cetirizine (ZYRTEC) 10 MG tablet Take 10 mg by mouth daily.    [provider]  ferrous sulfate 325 (65 FE) MG tablet TAKE 1 TABLET BY MOUTH TWICE DAILY WITH MEALS TO KEEP BLOOD COUNT UP Patient taking differently: Take 325 mg by mouth 2 (two) times daily with a meal.  08/26/16   Riccio, Gardiner Rhyme, DO  furosemide (LASIX) 40 MG tablet Take 1 tablet (40 mg total) by mouth 2 (two) times daily. 06/03/18   Zenia Resides, MD  guaiFENesin 300 MG/15ML SOLN Take 300 mg by mouth 2 (two) times daily. 08/04/18   Steve Rattler, DO  ipratropium-albuterol (DUONEB) 0.5-2.5 (3) MG/3ML SOLN USE 3 ML VIA NEBULIZER EVERY 6 HOURS 10/21/18   Riccio, Gardiner Rhyme, DO  latanoprost (XALATAN) 0.005 % ophthalmic solution Place one drop in both eyes at bedtime. 02/16/18   [provider]  magnesium hydroxide (MILK OF MAGNESIA) 400 MG/5ML suspension Take 60 mLs by mouth daily as needed  for mild constipation.     [provider]  metoprolol tartrate (LOPRESSOR) 25 MG tablet TAKE 1 TABLET BY MOUTH EVERY 6 HOURS AS NEEDED FOR HEART FLUTTERING 10/20/18   Allred, Jeneen Rinks, MD  nitroGLYCERIN (NITROSTAT) 0.4 MG SL tablet Place 1 tablet (0.4 mg total) under the tongue every 5 (five) minutes as needed for chest pain. 08/04/18   Steve Rattler, DO  Olopatadine HCl 0.2 % SOLN Place one to two drops in each once daily as needed for itching. 02/17/18   [provider]  Phenylephrine-DM-GG (ROBAFEN CF MULTI-SYMPTOM COLD) 5-10-100 MG/5ML LIQD Take 10 mLs by mouth daily as needed (cold).     [provider]  polyethylene glycol (MIRALAX / GLYCOLAX) packet Take 17 g by mouth daily as needed for mild constipation. 11/26/18   Steve Rattler, DO  potassium chloride (K-DUR) 10 MEQ tablet Take 1 tablet (10 mEq total) by mouth daily. 02/05/18   Steve Rattler, DO  potassium chloride (K-DUR,KLOR-CON) 10 MEQ tablet TAKE 1 TABLET BY MOUTH DAILY 10/21/18   Lucila Maine C, DO  predniSONE (DELTASONE) 1 MG tablet Take 7 mg by mouth daily with breakfast.     [provider]  predniSONE (DELTASONE) 20 MG tablet 2 tabs po daily x 4 days 12/10/18   Lidwina Kaner, Corene Cornea, MD  ranitidine (ZANTAC) 150 MG tablet Take 1 tablet (150 mg total) by mouth daily. 03/28/18   Muthersbaugh, Jarrett Soho, PA-C  rosuvastatin (CRESTOR) 20 MG tablet Take 1 tablet (20 mg total) by mouth daily. 06/03/18   Zenia Resides, MD  senna (SENOKOT) 8.6 MG TABS tablet Take 1 tablet by mouth daily as needed for mild constipation.    [provider]  sodium chloride (OCEAN) 0.65 % SOLN nasal spray Place 1 spray into both nostrils daily as needed for congestion.     [provider]  triamcinolone cream (KENALOG) 0.1 % Apply 1 application topically 2 (two) times daily. Patient taking differently: Apply 1 application topically 2 (two) times daily as needed (rash).  06/03/18   Zenia Resides, MD    Family  History Family History  Problem Relation Age of Onset  . Hypertension Mother   . Stroke Mother   . Heart disease Mother   . Aneurysm Mother   . CVA Mother   . Hypertension Father   . Stroke Father   . Heart attack  Father   . Alcohol abuse Father   . Hypertension Sister   . Hypertension Brother   . Colon cancer Brother   . Cancer Brother   . Diabetes Sister   . Diabetes Brother   . Heart attack Brother   . Heart attack Brother   . Heart disease Sister   . Alcohol abuse Brother   . Heart disease Daughter     Social History Social History   Tobacco Use  . Smoking status: Former Smoker    Packs/day: 0.10    Years: 50.00    Pack years: 5.00    Types: Cigarettes    Last attempt to quit: 08/16/2011    Years since quitting: 7.3  . Smokeless tobacco: Never Used  . Tobacco comment: quit 2012  Substance Use Topics  . Alcohol use: No    Comment: quit 08/16/2011  . Drug use: No     Allergies   Ace inhibitors and Other   Review of Systems Review of Systems  Respiratory: Positive for cough and shortness of breath.   Cardiovascular: Negative for chest pain.  All other systems reviewed and are negative.    Physical Exam Updated Vital Signs BP 126/72   Pulse 70   Temp 99.5 F (37.5 C) (Oral)   Resp (!) 24   SpO2 94%   Physical Exam Vitals signs and nursing note reviewed.  Constitutional:      Appearance: She is well-developed.  HENT:     Head: Normocephalic and atraumatic.  Neck:     Musculoskeletal: Normal range of motion.  Cardiovascular:     Rate and Rhythm: Normal rate and regular rhythm.  Pulmonary:     Effort: Tachypnea present. No respiratory distress.     Breath sounds: No stridor. Decreased breath sounds, wheezing and rhonchi present.  Abdominal:     General: There is no distension.  Musculoskeletal: Normal range of motion.     Right lower leg: No edema.     Left lower leg: No edema.  Skin:    General: Skin is warm and dry.  Neurological:      General: No focal deficit present.     Mental Status: She is alert.      ED Treatments / Results  Labs (all labs ordered are listed, but only abnormal results are displayed) Labs Reviewed  COMPREHENSIVE METABOLIC PANEL - Abnormal; Notable for the following components:      Result Value   Glucose, Bld 124 (*)    BUN 49 (*)    Creatinine, Ser 1.80 (*)    GFR calc non Af Amer 27 (*)    GFR calc Af Amer 32 (*)    All other components within normal limits  CBC WITH DIFFERENTIAL/PLATELET - Abnormal; Notable for the following components:   RBC 3.49 (*)    Hemoglobin 9.4 (*)    HCT 32.9 (*)    MCHC 28.6 (*)    RDW 17.2 (*)    All other components within normal limits    EKG EKG Interpretation  Date/Time:  Friday December 10 2018 10:28:15 EST Ventricular Rate:  70 PR Interval:    QRS Duration: 204 QT Interval:  498 QTC Calculation: 537 R Axis:   -82 Text Interpretation:  Electronic ventricular pacemaker Confirmed by Merrily Pew [phone]) on 12/10/2018 11:07:30 AM   Radiology Dg Chest 2 View  Result Date: 12/10/2018 CLINICAL DATA:  Cough and congestion EXAM: CHEST - 2 VIEW COMPARISON:  September 21, 2018 FINDINGS: Tracheostomy catheter  tip is 7.6 cm above the carina. Pacemaker present with lead tips attached to right atrium and middle cardiac vein. A safety pin overlies the anterior chest. No pneumothorax. There is no edema or consolidation. Heart is enlarged with pulmonary vascularity normal. There is aortic atherosclerosis. No bone lesions evident. IMPRESSION: Tracheostomy as described without pneumothorax. No edema or consolidation. Cardiomegaly with pacemaker leads attached to right atrium and middle cardiac vein. There is aortic atherosclerosis. Aortic Atherosclerosis (ICD10-I70.0). Electronically Signed   By: Lowella Grip III M.D.   On: 12/10/2018 12:00    Procedures Procedures (including critical care time)  Medications Ordered in ED Medications  albuterol  (PROVENTIL) (2.5 MG/3ML) 0.083% nebulizer solution (2.5 mg  Given 12/10/18 1055)  ipratropium-albuterol (DUONEB) 0.5-2.5 (3) MG/3ML nebulizer solution 3 mL (3 mLs Nebulization Given 12/10/18 1623)  azithromycin (ZITHROMAX) tablet 500 mg (500 mg Oral Given 12/10/18 1615)  predniSONE (DELTASONE) tablet 60 mg (60 mg Oral Given 12/10/18 1616)     Initial Impression / Assessment and Plan / ED Course  I have reviewed the triage vital signs and the nursing notes.  Pertinent labs & imaging results that were available during my care of the patient were reviewed by me and considered in my medical decision making (see chart for details).     Trach dependent patient with history of bronchitis sounds that she is here with an acute episode of bronchitis.  She is had a cough recently.  She had difficulty breathing today through her trach.  After a couple duo nebs, antibiotics and steroids patient has significant improvement in her symptoms.  She is not hypoxic.  She states that her breathing seems to be better.  Her lung exam is much improved from above.  Will discharge on same with close PCP follow-up and strict return precautions.  Final Clinical Impressions(s) / ED Diagnoses   Final diagnoses:  Cough    ED Discharge Orders         Ordered    azithromycin (ZITHROMAX) 250 MG tablet  Daily     12/10/18 1606    predniSONE (DELTASONE) 20 MG tablet     12/10/18 1606    benzonatate (TESSALON) 100 MG capsule  3 times daily PRN     12/10/18 1606           Masyn Rostro, Corene Cornea, MD 12/10/18 1708

## 2018-12-10 NOTE — ED Triage Notes (Signed)
Pt states cough, chills, congestion X3 days. Pt does have trach, sounds to have some secretions. No distress noted SPO2% 94. Skin warm and dry.

## 2018-12-14 ENCOUNTER — Ambulatory Visit (HOSPITAL_COMMUNITY)
Admission: RE | Admit: 2018-12-14 | Discharge: 2018-12-14 | Disposition: A | Discharge status: Home / Self Care | Payer: Medicare Other | Source: Ambulatory Visit | Attending: Nephrology | Admitting: Nephrology

## 2018-12-14 VITALS — BP 115/78 | HR 70 | Temp 98.6°F | Resp 20 | Ht 66.0 in | Wt 173.0 lb

## 2018-12-14 DIAGNOSIS — N183 Chronic kidney disease, stage 3 unspecified: Secondary | ICD-10-CM

## 2018-12-14 LAB — POCT HEMOGLOBIN-HEMACUE: Hemoglobin: 9.8 g/dL — ABNORMAL LOW (ref 12.0–15.0)

## 2018-12-14 MED ORDER — EPOETIN ALFA-EPBX 10000 UNIT/ML IJ SOLN
10000.0000 [IU] | INTRAMUSCULAR | Status: DC
  Administered 2018-12-14: 10000 [IU] via SUBCUTANEOUS
  Filled 2018-12-14: qty 1

## 2018-12-14 MED ORDER — SODIUM CHLORIDE 0.9 % IV SOLN
510.0000 mg | INTRAVENOUS | Status: DC
  Administered 2018-12-14: 510 mg via INTRAVENOUS
  Filled 2018-12-14: qty 17

## 2018-12-16 DIAGNOSIS — M353 Polymyalgia rheumatica: Secondary | ICD-10-CM | POA: Diagnosis not present

## 2018-12-16 DIAGNOSIS — M1A09X0 Idiopathic chronic gout, multiple sites, without tophus (tophi): Secondary | ICD-10-CM | POA: Diagnosis not present

## 2018-12-16 DIAGNOSIS — M2550 Pain in unspecified joint: Secondary | ICD-10-CM | POA: Diagnosis not present

## 2018-12-21 DIAGNOSIS — I4891 Unspecified atrial fibrillation: Secondary | ICD-10-CM | POA: Diagnosis not present

## 2018-12-21 DIAGNOSIS — D649 Anemia, unspecified: Secondary | ICD-10-CM | POA: Diagnosis not present

## 2018-12-21 DIAGNOSIS — J398 Other specified diseases of upper respiratory tract: Secondary | ICD-10-CM | POA: Diagnosis not present

## 2018-12-21 DIAGNOSIS — Z930 Tracheostomy status: Secondary | ICD-10-CM | POA: Diagnosis not present

## 2018-12-21 DIAGNOSIS — J449 Chronic obstructive pulmonary disease, unspecified: Secondary | ICD-10-CM | POA: Diagnosis not present

## 2018-12-27 DIAGNOSIS — J386 Stenosis of larynx: Secondary | ICD-10-CM | POA: Diagnosis not present

## 2018-12-27 DIAGNOSIS — Z430 Encounter for attention to tracheostomy: Secondary | ICD-10-CM | POA: Diagnosis not present

## 2019-01-06 ENCOUNTER — Encounter: Payer: Self-pay | Admitting: Family Medicine

## 2019-01-11 ENCOUNTER — Encounter (HOSPITAL_COMMUNITY)
Admission: RE | Admit: 2019-01-11 | Discharge: 2019-01-11 | Disposition: A | Discharge status: Home / Self Care | Payer: Medicare Other | Source: Ambulatory Visit | Attending: Nephrology | Admitting: Nephrology

## 2019-01-11 VITALS — BP 119/73 | HR 68

## 2019-01-11 DIAGNOSIS — D631 Anemia in chronic kidney disease: Secondary | ICD-10-CM | POA: Diagnosis not present

## 2019-01-11 DIAGNOSIS — N183 Chronic kidney disease, stage 3 unspecified: Secondary | ICD-10-CM

## 2019-01-11 DIAGNOSIS — N189 Chronic kidney disease, unspecified: Secondary | ICD-10-CM | POA: Insufficient documentation

## 2019-01-11 LAB — IRON AND TIBC
Iron: 96 ug/dL (ref 28–170)
Saturation Ratios: 27 % (ref 10.4–31.8)
TIBC: 361 ug/dL (ref 250–450)
UIBC: 265 ug/dL

## 2019-01-11 LAB — FERRITIN: Ferritin: 767 ng/mL — ABNORMAL HIGH (ref 11–307)

## 2019-01-11 LAB — POCT HEMOGLOBIN-HEMACUE: Hemoglobin: 11.3 g/dL — ABNORMAL LOW (ref 12.0–15.0)

## 2019-01-11 MED ORDER — EPOETIN ALFA-EPBX 10000 UNIT/ML IJ SOLN
10000.0000 [IU] | INTRAMUSCULAR | Status: DC
  Administered 2019-01-11: 10000 [IU] via SUBCUTANEOUS
  Filled 2019-01-11: qty 1

## 2019-01-27 DIAGNOSIS — J449 Chronic obstructive pulmonary disease, unspecified: Secondary | ICD-10-CM | POA: Diagnosis not present

## 2019-01-27 DIAGNOSIS — I4891 Unspecified atrial fibrillation: Secondary | ICD-10-CM | POA: Diagnosis not present

## 2019-01-27 DIAGNOSIS — J398 Other specified diseases of upper respiratory tract: Secondary | ICD-10-CM | POA: Diagnosis not present

## 2019-01-27 DIAGNOSIS — Z930 Tracheostomy status: Secondary | ICD-10-CM | POA: Diagnosis not present

## 2019-01-27 DIAGNOSIS — D649 Anemia, unspecified: Secondary | ICD-10-CM | POA: Diagnosis not present

## 2019-02-07 DIAGNOSIS — J40 Bronchitis, not specified as acute or chronic: Secondary | ICD-10-CM | POA: Diagnosis not present

## 2019-02-07 DIAGNOSIS — J386 Stenosis of larynx: Secondary | ICD-10-CM | POA: Diagnosis not present

## 2019-02-07 DIAGNOSIS — G4733 Obstructive sleep apnea (adult) (pediatric): Secondary | ICD-10-CM | POA: Diagnosis not present

## 2019-02-08 ENCOUNTER — Encounter (HOSPITAL_COMMUNITY)
Admission: RE | Admit: 2019-02-08 | Discharge: 2019-02-08 | Disposition: A | Discharge status: Home / Self Care | Payer: Medicare Other | Source: Ambulatory Visit | Attending: Family Medicine | Admitting: Family Medicine

## 2019-02-08 VITALS — BP 127/77 | HR 70 | Temp 98.3°F | Resp 20

## 2019-02-08 DIAGNOSIS — N183 Chronic kidney disease, stage 3 unspecified: Secondary | ICD-10-CM

## 2019-02-08 LAB — FERRITIN: Ferritin: 390 ng/mL — ABNORMAL HIGH (ref 11–307)

## 2019-02-08 LAB — IRON AND TIBC
Iron: 90 ug/dL (ref 28–170)
Saturation Ratios: 25 % (ref 10.4–31.8)
TIBC: 358 ug/dL (ref 250–450)
UIBC: 268 ug/dL

## 2019-02-08 LAB — POCT HEMOGLOBIN-HEMACUE: Hemoglobin: 11 g/dL — ABNORMAL LOW (ref 12.0–15.0)

## 2019-02-08 MED ORDER — EPOETIN ALFA-EPBX 10000 UNIT/ML IJ SOLN
10000.0000 [IU] | INTRAMUSCULAR | Status: DC
  Administered 2019-02-08: 10000 [IU] via SUBCUTANEOUS
  Filled 2019-02-08: qty 1

## 2019-03-07 ENCOUNTER — Other Ambulatory Visit: Payer: Self-pay

## 2019-03-08 ENCOUNTER — Ambulatory Visit (HOSPITAL_COMMUNITY)
Admission: RE | Admit: 2019-03-08 | Discharge: 2019-03-08 | Disposition: A | Discharge status: Home / Self Care | Payer: Medicare Other | Source: Ambulatory Visit | Attending: Nephrology | Admitting: Nephrology

## 2019-03-08 ENCOUNTER — Other Ambulatory Visit: Payer: Self-pay

## 2019-03-08 VITALS — BP 128/78 | HR 70 | Temp 98.3°F | Resp 20

## 2019-03-08 DIAGNOSIS — N183 Chronic kidney disease, stage 3 unspecified: Secondary | ICD-10-CM

## 2019-03-08 DIAGNOSIS — D631 Anemia in chronic kidney disease: Secondary | ICD-10-CM | POA: Insufficient documentation

## 2019-03-08 LAB — IRON AND TIBC
Iron: 74 ug/dL (ref 28–170)
Saturation Ratios: 20 % (ref 10.4–31.8)
TIBC: 375 ug/dL (ref 250–450)
UIBC: 301 ug/dL

## 2019-03-08 LAB — FERRITIN: Ferritin: 292 ng/mL (ref 11–307)

## 2019-03-08 LAB — POCT HEMOGLOBIN-HEMACUE: Hemoglobin: 9.3 g/dL — ABNORMAL LOW (ref 12.0–15.0)

## 2019-03-08 MED ORDER — EPOETIN ALFA-EPBX 10000 UNIT/ML IJ SOLN
10000.0000 [IU] | INTRAMUSCULAR | Status: DC
  Administered 2019-03-08: 10000 [IU] via SUBCUTANEOUS
  Filled 2019-03-08: qty 1

## 2019-03-12 ENCOUNTER — Emergency Department (HOSPITAL_COMMUNITY): Payer: Medicare Other

## 2019-03-12 ENCOUNTER — Encounter (HOSPITAL_COMMUNITY): Payer: Self-pay | Admitting: Emergency Medicine

## 2019-03-12 ENCOUNTER — Other Ambulatory Visit: Payer: Self-pay

## 2019-03-12 ENCOUNTER — Emergency Department (HOSPITAL_COMMUNITY)
Admission: EM | Admit: 2019-03-12 | Discharge: 2019-03-12 | Disposition: A | Discharge status: Home / Self Care | Payer: Medicare Other | Attending: Emergency Medicine | Admitting: Emergency Medicine

## 2019-03-12 DIAGNOSIS — Z7982 Long term (current) use of aspirin: Secondary | ICD-10-CM | POA: Insufficient documentation

## 2019-03-12 DIAGNOSIS — Z79899 Other long term (current) drug therapy: Secondary | ICD-10-CM | POA: Insufficient documentation

## 2019-03-12 DIAGNOSIS — R479 Unspecified speech disturbances: Secondary | ICD-10-CM

## 2019-03-12 DIAGNOSIS — R4789 Other speech disturbances: Secondary | ICD-10-CM | POA: Diagnosis not present

## 2019-03-12 DIAGNOSIS — Z950 Presence of cardiac pacemaker: Secondary | ICD-10-CM | POA: Insufficient documentation

## 2019-03-12 DIAGNOSIS — J449 Chronic obstructive pulmonary disease, unspecified: Secondary | ICD-10-CM | POA: Insufficient documentation

## 2019-03-12 DIAGNOSIS — R4781 Slurred speech: Secondary | ICD-10-CM | POA: Diagnosis not present

## 2019-03-12 DIAGNOSIS — I129 Hypertensive chronic kidney disease with stage 1 through stage 4 chronic kidney disease, or unspecified chronic kidney disease: Secondary | ICD-10-CM | POA: Insufficient documentation

## 2019-03-12 DIAGNOSIS — N183 Chronic kidney disease, stage 3 (moderate): Secondary | ICD-10-CM | POA: Diagnosis not present

## 2019-03-12 DIAGNOSIS — J9811 Atelectasis: Secondary | ICD-10-CM | POA: Diagnosis not present

## 2019-03-12 DIAGNOSIS — R0602 Shortness of breath: Secondary | ICD-10-CM | POA: Diagnosis not present

## 2019-03-12 LAB — COMPREHENSIVE METABOLIC PANEL
ALT: 10 U/L (ref 0–44)
ANION GAP: 9 (ref 5–15)
AST: 20 U/L (ref 15–41)
Albumin: 3.7 g/dL (ref 3.5–5.0)
Alkaline Phosphatase: 47 U/L (ref 38–126)
BUN: 43 mg/dL — ABNORMAL HIGH (ref 8–23)
CO2: 28 mmol/L (ref 22–32)
Calcium: 9.6 mg/dL (ref 8.9–10.3)
Chloride: 98 mmol/L (ref 98–111)
Creatinine, Ser: 1.66 mg/dL — ABNORMAL HIGH (ref 0.44–1.00)
GFR calc Af Amer: 35 mL/min — ABNORMAL LOW (ref >60)
GFR calc non Af Amer: 30 mL/min — ABNORMAL LOW (ref >60)
Glucose, Bld: 110 mg/dL — ABNORMAL HIGH (ref 70–99)
POTASSIUM: 3.7 mmol/L (ref 3.5–5.1)
Sodium: 135 mmol/L (ref 135–145)
Total Bilirubin: 0.3 mg/dL (ref 0.3–1.2)
Total Protein: 6.4 g/dL — ABNORMAL LOW (ref 6.5–8.1)

## 2019-03-12 LAB — RAPID URINE DRUG SCREEN, HOSP PERFORMED
AMPHETAMINES: NOT DETECTED
BENZODIAZEPINES: NOT DETECTED
Barbiturates: NOT DETECTED
Cocaine: NOT DETECTED
Opiates: NOT DETECTED
Tetrahydrocannabinol: NOT DETECTED

## 2019-03-12 LAB — URINALYSIS, ROUTINE W REFLEX MICROSCOPIC
Bilirubin Urine: NEGATIVE
Glucose, UA: NEGATIVE mg/dL
Hgb urine dipstick: NEGATIVE
Ketones, ur: NEGATIVE mg/dL
Leukocytes,Ua: NEGATIVE
Nitrite: NEGATIVE
Protein, ur: NEGATIVE mg/dL
Specific Gravity, Urine: 1.004 — ABNORMAL LOW (ref 1.005–1.030)
pH: 6 (ref 5.0–8.0)

## 2019-03-12 LAB — PROTIME-INR
INR: 1 (ref 0.8–1.2)
Prothrombin Time: 13.4 seconds (ref 11.4–15.2)

## 2019-03-12 LAB — DIFFERENTIAL
Abs Immature Granulocytes: 0.02 10*3/uL (ref 0.00–0.07)
Basophils Absolute: 0 10*3/uL (ref 0.0–0.1)
Basophils Relative: 1 %
EOS ABS: 0.1 10*3/uL (ref 0.0–0.5)
Eosinophils Relative: 1 %
IMMATURE GRANULOCYTES: 0 %
Lymphocytes Relative: 21 %
Lymphs Abs: 1.2 10*3/uL (ref 0.7–4.0)
Monocytes Absolute: 0.6 10*3/uL (ref 0.1–1.0)
Monocytes Relative: 12 %
Neutro Abs: 3.6 10*3/uL (ref 1.7–7.7)
Neutrophils Relative %: 65 %

## 2019-03-12 LAB — CBC
HCT: 30.5 % — ABNORMAL LOW (ref 36.0–46.0)
Hemoglobin: 8.9 g/dL — ABNORMAL LOW (ref 12.0–15.0)
MCH: 28.3 pg (ref 26.0–34.0)
MCHC: 29.2 g/dL — ABNORMAL LOW (ref 30.0–36.0)
MCV: 96.8 fL (ref 80.0–100.0)
Platelets: 273 10*3/uL (ref 150–400)
RBC: 3.15 MIL/uL — ABNORMAL LOW (ref 3.87–5.11)
RDW: 15.8 % — ABNORMAL HIGH (ref 11.5–15.5)
WBC: 5.6 10*3/uL (ref 4.0–10.5)
nRBC: 0.5 % — ABNORMAL HIGH (ref 0.0–0.2)

## 2019-03-12 LAB — APTT: aPTT: 27 seconds (ref 24–36)

## 2019-03-12 MED ORDER — ASPIRIN EC 81 MG PO TBEC: 325.0000 mg | DELAYED_RELEASE_TABLET | Freq: Every day | ORAL | 0 refills | Status: AC

## 2019-03-12 NOTE — Discharge Instructions (Signed)
You have been seen by Neurology for possible mini-stroke.  They are recommending outpatient follow up with Neurology, increasing aspirin.

## 2019-03-12 NOTE — ED Triage Notes (Signed)
Pt in from home with c/o sob, worsened x few days, also began having aphasia and slurred speech yesterday around 1200 while talking to daughter on the phone. States she also had bilateral hand tingling at that time. Reports improved speech today, denies any weakness/numbness. Has trach present, reports some incr coughing, denies any fevers or recent travel.

## 2019-03-12 NOTE — ED Notes (Signed)
yanelli, zapanta patient's daughter wanted to leave her number for contact [phone]

## 2019-03-12 NOTE — Consult Note (Addendum)
NEURO HOSPITALIST  CONSULT   Requesting Physician: Dr. Billy Fischer  Chief Complaint: SOB/ aphasia/ slurred speech   History obtained from:  Patient     HPI:                                                                                                                                         [name] is an 73 y.o. female  With PMH of MI, A. Fib ( permanent pacemaker, not on anticoagulation), HTN, HLD, GI bleed, CHF, chronic tracheostomy due to chronic upper airway narrowing placed in 2017, who presented to [facility] ED  With c/o SOB/ aphasia/ and slurred speech that started yesterday.  Per patient yesterday about noon she had some word finding difficulty. She could understand what her daughter was saying, but could not get the words to come out of her mouth. She denies any CP, vision changes, weakness, difficulty walking.  She states that she had some SOB but it was not any worse than her usual SOB that she has had since her trach placement in 2017.  Later on, she said that her shortness of breath was somewhat worse and she had a mild headache also when her symptoms started. She also stated that she had bilateral hand numbness, and bilateral feet numbness. She says that she has had the numbness intermittently for over a month. It was not worse yesterday, and has resolved today.  She felt that it was better this morning, but not completely resolved.  At this tim, she feels that her symptoms are nearly resolved although not at 100% baseline.   \Denies any prior stroke history, ETOH, smoking, or drug abuse, fevers. She does take a daily baby aspirin.   Of note, she is a patient of: [facility] and has been deemed to be not a candidate for either anticoagulation or watchman device.  ED course:  CTH: no hemorrhage MRI not possible due to old pacemaker BP: 122/81  Date last known well:03/11/2019 Time last known well:  1200 tPA Given: No: outside of window  Modified Rankin: Rankin Score=1 NIHSS:4  1a Level of Conscious:0 1b LOC Questions: 1 1c LOC Commands: 0 2 Best Gaze: 0 3 Visual: 0 4 Facial Palsy: 1 (baseline) 5a Motor Arm - left: 0 5b Motor Arm - Right: 0 6a Motor Leg - Left: 1  (baseline) 6b Motor Leg - Right: 1 (baseline) 7 Limb Ataxia: 0 8 Sensory: 0 9 Best Language: 0 10 Dysarthria:0 11 Extinct. and Inattention:0 TOTAL: 4 (delta NIH 1)  Past Medical History:  Diagnosis Date  . Anemia   . Angina   .  Angioedema    2/2 ACE  . Arteriovenous malformation of stomach   . Arthritis   . Asthma   . AVM (arteriovenous malformation) of colon    small intestine; stomach  . Blood transfusion   . CHF (congestive heart failure) ([place])   . Complete heart block ([place])    s/p PPM 1998  . Complication of anesthesia    Difficult airway; anaphylaxis and swelling with propofol  . DDD (degenerative disc disease)   . Depression   . Diastolic heart failure   . Fatty liver 07/26/10  . GERD (gastroesophageal reflux disease)   . GI bleed   . Headache   . History of alcohol abuse Stopped Fall 2012  . History of tobacco use Quit Fall 2012  . Hx of cardiovascular stress test    a. Lexiscan Myoview (10/15):  Small inferolateral and apical defect c/w scar and poss soft tissue attenuation, no ischemia, EF 42%  . Hx of colonic polyp 08/13/10   adenomatous  . Hx of colonoscopy   . Hyperlipidemia   . Hyperlipidemia   . Hypertension   . Hypertrophic cardiomyopathy ([place])    dx by Dr [name] 2009  . Iron deficiency anemia   . Myocardial infarction ([place])   . Panic attack   . Panic attacks   . Permanent atrial fibrillation   . Pneumonia   . Renal failure    baseline creatinine 1.6  . Right arm pain 01/08/2012  . RLS (restless legs syndrome)    Dx 06/2007  . Shortness of breath    sob on exertation  . Sleep apnea     Past Surgical History:  Procedure Laterality Date  . CARDIAC CATHETERIZATION     . CARDIAC CATHETERIZATION N/A 08/19/2016   Procedure: Right/Left Heart Cath and Coronary Angiography;  Surgeon: Jolaine Artist, MD;  Location: [place] [facility];  Service: Cardiovascular;  Laterality: N/A;  . COLONOSCOPY WITH PROPOFOL N/A 06/16/2017   Procedure: COLONOSCOPY WITH PROPOFOL;  Surgeon: Mauri Pole, MD;  Location: [facility];  Service: Endoscopy;  Laterality: N/A;  . DIRECT LARYNGOSCOPY N/A 09/18/2016   Procedure: DIRECT LARYNGOSCOPY, BRONCHOSCOPY, REMOVAL OF INTUBATION GRANULOMA;  Surgeon: [name], MD;  Location[facility] OR;  Service: ENT;  Laterality: N/A;  . DIRECT LARYNGOSCOPY N/A 10/20/2016   Procedure: EXTUBATION AND FLEXIBLE LARYNGOSCOPE;  Surgeon: [name], MD;  Location: [place];  Service: ENT;  Laterality: N/A;  . DIRECT LARYNGOSCOPY N/A 10/29/2016   Procedure: DIRECT LARYNGOSCOPY;  Surgeon: [name], MD;  Location: [facility] OR;  Service: ENT;  Laterality: N/A;  . ESOPHAGOGASTRODUODENOSCOPY  12/23/2011   Procedure: ESOPHAGOGASTRODUODENOSCOPY (EGD);  Surgeon: [name], MD;  Location: [facility];  Service: Endoscopy;  Laterality: N/A;  . ESOPHAGOGASTRODUODENOSCOPY (EGD) WITH PROPOFOL N/A 06/16/2017   Procedure: ESOPHAGOGASTRODUODENOSCOPY (EGD) WITH PROPOFOL;  Surgeon: Mauri Pole, MD;  Location: [facility];  Service: Endoscopy;  Laterality: N/A;  . EXTUBATION (ENDOTRACHEAL) IN OR N/A 07/21/2016   Procedure: EXTUBATION (ENDOTRACHEAL) IN OR;  Surgeon: [name], MD;  Location: [place];  Service: ENT;  Laterality: N/A;  . GIVENS CAPSULE STUDY  12/23/2011   Procedure: GIVENS CAPSULE STUDY;  Surgeon: [name], MD;  Location: [facility];  Service: Endoscopy;  Laterality: N/A;  . MICROLARYNGOSCOPY WITH LASER N/A 10/16/2016   Procedure: MICRODIRECTLARYNGOSCOPY WITH LASER ABLATION AND KENLOG INJECTION;  Surgeon: [name], MD;  Location: [place];  Service: ENT;  Laterality: N/A;  . [place], most  recent gen change by [name] 4/12   . TRACHEOSTOMY TUBE PLACEMENT N/A 10/29/2016   Procedure: TRACHEOSTOMY;  Surgeon: [name], MD;  Location: [place];  Service: ENT;  Laterality: N/A;  . TUBAL LIGATION  04/01/2000    Family History  Problem Relation Age of Onset  . Hypertension Mother   . Stroke Mother   . Heart disease Mother   . Aneurysm Mother   . CVA Mother   . Hypertension Father   . Stroke Father   . Heart attack Father   . Alcohol abuse Father   . Hypertension Sister   . Hypertension Brother   . Colon cancer Brother   . Cancer Brother   . Diabetes Sister   . Diabetes Brother   . Heart attack Brother   . Heart attack Brother   . Heart disease Sister   . Alcohol abuse Brother   . Heart disease Daughter          Social History:  reports that she quit smoking about 7 years ago. Her smoking use included cigarettes. She has a 5.00 pack-year smoking history. She has never used smokeless tobacco. She reports that she does not drink alcohol or use drugs.  Allergies:  Allergies  Allergen Reactions  . Ace Inhibitors Swelling    angiodema  . Other Anaphylaxis and Swelling    Reaction to unspecified anesthesia     Medications:                                                                                                                           No current facility-administered medications for this encounter.    Current Outpatient Medications  Medication Sig Dispense Refill  . acetaminophen (TYLENOL) 325 MG tablet Take 325-650 mg by mouth every 6 (six) hours as needed for mild pain, fever or headache.     . albuterol (PROVENTIL HFA;VENTOLIN HFA) 108 (90 Base) MCG/ACT inhaler Inhale 1-2 puffs into the lungs every 6 (six) hours as needed for wheezing or shortness of breath. 1 Inhaler 6  . allopurinol (ZYLOPRIM) 100 MG tablet Take 2 tablets (200 mg total) by mouth daily. 180 tablet 3  . ALPRAZolam (XANAX) 0.5 MG tablet Take 1 tablet (0.5 mg total) by mouth daily as needed for anxiety. 90 tablet 2  .  aspirin EC 81 MG tablet Take 81 mg by mouth daily.     [name] azithromycin (ZITHROMAX) 250 MG tablet Take 1 tablet (250 mg total) by mouth daily. Take 1 every day until finished. 4 tablet 0  . benzonatate (TESSALON) 100 MG capsule Take 1 capsule (100 mg total) by mouth 3 (three) times daily as needed for cough. 21 capsule 0  . cetirizine (ZYRTEC) 10 MG tablet Take 10 mg by mouth daily.    . ferrous sulfate 325 (65 FE) MG tablet TAKE 1 TABLET BY MOUTH TWICE DAILY WITH MEALS TO KEEP BLOOD COUNT UP (Patient taking differently: Take 325 mg by mouth 2 (two) times  daily with a meal. ) 60 tablet 11  . furosemide (LASIX) 40 MG tablet Take 1 tablet (40 mg total) by mouth 2 (two) times daily. 180 tablet 3  . guaiFENesin 300 MG/15ML SOLN Take 300 mg by mouth 2 (two) times daily. 1000 mL 3  . ipratropium-albuterol (DUONEB) 0.5-2.5 (3) MG/3ML SOLN USE 3 ML VIA NEBULIZER EVERY 6 HOURS 360 mL 0  . latanoprost (XALATAN) 0.005 % ophthalmic solution Place one drop in both eyes at bedtime.  3  . magnesium hydroxide (MILK OF MAGNESIA) 400 MG/5ML suspension Take 60 mLs by mouth daily as needed for mild constipation.     . metoprolol tartrate (LOPRESSOR) 25 MG tablet TAKE 1 TABLET BY MOUTH EVERY 6 HOURS AS NEEDED FOR HEART FLUTTERING 360 tablet 3  . nitroGLYCERIN (NITROSTAT) 0.4 MG SL tablet Place 1 tablet (0.4 mg total) under the tongue every 5 (five) minutes as needed for chest pain. 25 tablet 1  . Olopatadine HCl 0.2 % SOLN Place one to two drops in each once daily as needed for itching.  1  . Phenylephrine-DM-GG (ROBAFEN CF MULTI-SYMPTOM COLD) 5-10-100 MG/5ML LIQD Take 10 mLs by mouth daily as needed (cold).     . polyethylene glycol (MIRALAX / GLYCOLAX) packet Take 17 g by mouth daily as needed for mild constipation. 100 each 1  . potassium chloride (K-DUR) 10 MEQ tablet Take 1 tablet (10 mEq total) by mouth daily. 90 tablet 3  . potassium chloride (K-DUR,KLOR-CON) 10 MEQ tablet TAKE 1 TABLET BY MOUTH DAILY 90 tablet 0   . predniSONE (DELTASONE) 1 MG tablet Take 7 mg by mouth daily with breakfast.     . predniSONE (DELTASONE) 20 MG tablet 2 tabs po daily x 4 days 8 tablet 0  . ranitidine (ZANTAC) 150 MG tablet Take 1 tablet (150 mg total) by mouth daily. 15 tablet 0  . rosuvastatin (CRESTOR) 20 MG tablet Take 1 tablet (20 mg total) by mouth daily. 90 tablet 3  . senna (SENOKOT) 8.6 MG TABS tablet Take 1 tablet by mouth daily as needed for mild constipation.    . sodium chloride (OCEAN) 0.65 % SOLN nasal spray Place 1 spray into both nostrils daily as needed for congestion.     . triamcinolone cream (KENALOG) 0.1 % Apply 1 application topically 2 (two) times daily. (Patient taking differently: Apply 1 application topically 2 (two) times daily as needed (rash). ) 30 g 2     ROS:                                                                                                                                       ROS was performed and is negative except as noted in HPI    General Examination:  Blood pressure 122/81, pulse 70, temperature 97.6 F (36.4 C), temperature source Oral, resp. rate 14, weight 78.5 kg, SpO2 100 %.  HEENT-  Normocephalic, no lesions, without obvious abnormality.  Normal external eye and conjunctiva. Lungs-no rhonchi or wheezing noted, no excessive working breathing.  Saturations within normal limits on RA Extremities- Warm, dry and intact Musculoskeletal-no joint tenderness, deformity or swelling Skin-warm and dry, intact  Neurological Examination Mental Status: Alert, oriented name/age. Stated the year was 71 and the month was April. thought content appropriate.  Speech fluent without evidence of aphasia.  Able to follow commands without difficulty.  Cranial Nerves: II:  Visual fields grossly normal,  III,IV, VI: ptosis not present, extra-ocular motions intact bilaterally, pupils  equal, round, reactive to light and accommodation V,VII: smile asymmetric, slight right facial droop likely baseline looking at prior notes, facial light touch sensation normal bilaterally VIII: hearing normal bilaterally IX,X: uvula rises midline XI: bilateral shoulder shrug XII: midline tongue extension Motor: Right : Upper extremity   4/5  Left:     Upper extremity   4/5  Lower extremity   5/5   Lower extremity   5/5 Tone and bulk:normal tone throughout; no atrophy noted Sensory: light touch intact throughout, bilaterally Deep Tendon Reflexes: 2+ and symmetric biceps and patella Plantars: Right: downgoing   Left: downgoing Cerebellar: normal finger-to-nose,  normal heel-to-shin test Gait: deferred   Lab Results: Basic Metabolic Panel: Recent Labs  Lab 03/12/19 1344  NA 135  K 3.7  CL 98  CO2 28  GLUCOSE 110*  BUN 43*  CREATININE 1.66*  CALCIUM 9.6   CBC: Recent Labs  Lab 03/08/19 1201  HGB 9.3*    Imaging: Dg Chest 2 View  Result Date: 03/12/2019 CLINICAL DATA:  Shortness of breath. EXAM: CHEST - 2 VIEW COMPARISON:  Radiographs of December 10, 2018. FINDINGS: Stable cardiomegaly. Right-sided pacemaker is unchanged in position. No pneumothorax or pleural effusion is noted. Tracheostomy tube is in grossly good position. Minimal left lingular scarring or subsegmental atelectasis is noted. Right lung is clear. Bony thorax is unremarkable. IMPRESSION: Minimal left lingular subsegmental atelectasis or scarring. Stable cardiomegaly. No other significant abnormality seen. Electronically Signed   By: Marijo Conception, M.D.   On: 03/12/2019 15:44   Ct Head Wo Contrast  Result Date: 03/12/2019 CLINICAL DATA:  Aphasia and slurred speech. EXAM: CT HEAD WITHOUT CONTRAST TECHNIQUE: Contiguous axial images were obtained from the base of the skull through the vertex without intravenous contrast. COMPARISON:  11/12/2016. FINDINGS: Brain: No evidence for acute infarction, hemorrhage,  mass lesion, hydrocephalus, or extra-axial fluid. Mild atrophy. Hypoattenuation of white matter, likely small vessel disease. Vascular: Calcification of the cavernous internal carotid arteries consistent with cerebrovascular atherosclerotic disease. No signs of intracranial large vessel occlusion. Skull: Intact. Sinuses/Orbits: BILATERAL maxillary sinus opacities, greater on the RIGHT. Widening of the RIGHT maxillary ostium, possible RIGHT antrochoanal polyp. Frontal, ethmoid, and sphenoid sinuses clear. Negative orbits. Other: None. Compared with priors, similar appearance. IMPRESSION: Atrophy and small vessel disease. No acute intracranial findings. BILATERAL maxillary sinus opacities, greater on the RIGHT. Possible RIGHT antrochoanal polyp. Electronically Signed   By: Staci Righter M.D.   On: 03/12/2019 15:50     Laurey Morale, MSN, NP-C [facility] [phone]  03/12/2019, 2:02 PM   Attending physician note to follow with Assessment and plan .  Attending addendum Patient seen and examined. Agree with the history and physical documented above that I have also performed independently. I have made the edits in  the note as needed.  Assessment: 73 y.o. female With PMH of MI, A. Fib (permanent pacemaker, not on anticoagulation- not a candidate per her cardiologist, not a candidate for watchman device), HTN, HLD, GI bleed, CHF who presented to [facility] ED  With c/o SOB/ aphasia/ and slurred speech that started yesterday.  TPA not given d/t presenting outside of the window. CH: no hemorrhage This could be a small stroke or a TIA but due to noncompatible pacemaker, unable to do MRI to confirm or rule out. The patient's symptoms have nearly resolved and she has had work-up in the past and has reliable follow-up with cardiology. I had a detailed discussion about the stroke work-up and things that we would do including an echocardiogram and head and neck vessel studies at this time if she were  to be admitted but both her and daughter had agreed that given the current concern for the ongoing COVID pandemic, she would rather follow outpatient and not be admitted inpatient for further work-up. . Stroke Risk Factors - atrial fibrillation, hyperlipidemia and hypertension  Impression #Speech disturbance- question whether this could be a small stroke or TIA but unable to do an MRI to confirm or rule out versus hypoxia/hypercarbia as she had headache around the time the symptoms happen..  Patient currently nearly back to baseline. Delta NIH maybe 1 - not concerning for a large stroke.  #Atrial fibrillation not a candidate for anticoagulation per cardiology.  Ideally anticoagulation would be preferred but that has been ruled out per the cardiology note review    Recommendations: CT of the head with no acute changes. No evidence of bleed We will increase the aspirin from 81 mg to 325 mg p.o. Follow-up with outpatient neurology in 4 weeks Follow-up with outpatient cardiology - revisit if anticoagulation is possible.  Detailed discussion about risks and benefits of work-up and hospital versus outpatient work-up in the current scenario of the COVID pandemic was held with patient and daughet Aida over the phone and they are both of the opinion that outpatient work up would be their choice.  I am in agreement and would recommend outpatient follow-up.  I discussed this plan in detail with Dr. Billy Fischer in the ER as well.   -- Amie Portland, MD [facility] Pager: [phone] If 7pm to 7am, please call on call as listed on AMION.

## 2019-03-12 NOTE — ED Provider Notes (Signed)
[facility] EMERGENCY DEPARTMENT Provider Note   CSN: [id number] Arrival date & time: 03/12/19  1248    History   Chief Complaint Chief Complaint  Patient presents with   Shortness of Breath   Aphasia    HPI [name] is a 73 y.o. female.     HPI   Seemed to have trouble finding words yesterday around 12 Has improved today but still not able to get out what she wants to say Denies numbness or weakness Daughter reports it seemed she was having difficulty saying numbers, seemed to have trouble saying words It seemed to get better Did have headache at time Reports shortness of breath, cough which are chronic since she had trach    Past Medical History:  Diagnosis Date   Anemia    Angina    Angioedema    2/2 ACE   Arteriovenous malformation of stomach    Arthritis    Asthma    AVM (arteriovenous malformation) of colon    small intestine; stomach   Blood transfusion    CHF (congestive heart failure) ([place])    Complete heart block ([place])    s/p PPM 0160   Complication of anesthesia    Difficult airway; anaphylaxis and swelling with propofol   DDD (degenerative disc disease)    Depression    Diastolic heart failure    Fatty liver 07/26/10   GERD (gastroesophageal reflux disease)    GI bleed    Headache    History of alcohol abuse Stopped Fall 2012   History of tobacco use Quit Fall 2012   Hx of cardiovascular stress test    a. Lexiscan Myoview (10/15):  Small inferolateral and apical defect c/w scar and poss soft tissue attenuation, no ischemia, EF 42%   Hx of colonic polyp 08/13/10   adenomatous   Hx of colonoscopy    Hyperlipidemia    Hyperlipidemia    Hypertension    Hypertrophic cardiomyopathy ([place])    dx by Dr [name] 2009   Iron deficiency anemia    Myocardial infarction [facility])    Panic attack    Panic attacks    Permanent atrial fibrillation    Pneumonia    Renal failure    baseline  creatinine 1.6   Right arm pain 01/08/2012   RLS (restless legs syndrome)    Dx 06/2007   Shortness of breath    sob on exertation   Sleep apnea     Patient Active Problem List   Diagnosis Date Noted   Other abnormal glucose 11/26/2018   Polypharmacy 06/25/2018   Depression with anxiety    Tracheostomy in place [facility])    Tracheostomy care ([place])    Calculus of gallbladder without cholecystitis without obstruction 12/24/2017   Chronic cough 09/25/2017   History of colonic polyps    Moderate protein-calorie malnutrition ([place]) 06/02/2017   Chronic idiopathic constipation 03/19/2017   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   Permanent atrial fibrillation    Laryngeal stenosis 09/18/2016   Esophageal dysphagia    Polymyalgia rheumatica ([place]) 04/10/2015   Hypertrophic cardiomyopathy ([place]) 02/15/2015   Eczema 09/18/2014   Environmental allergies 09/18/2014   Pre-diabetes 11/26/2013   COPD, moderate ([place]) 11/01/2013   Complete heart block ([place]) 04/03/2011   Anemia 02/17/2011   AVM (arteriovenous malformation) 02/13/2011   PACEMAKER-[place] 11/28/2010   CKD (chronic kidney disease) stage 3, GFR 30-59 ml/min ([place]) 09/24/2010   Acute on chronic congestive heart failure ([place]) 08/30/2010   ARTHRITIS 07/24/2010  Generalized anxiety disorder 07/23/2010   Hyperlipidemia 02/11/2007   Essential hypertension 02/11/2007   APNEA, SLEEP 02/11/2007    Past Surgical History:  Procedure Laterality Date   CARDIAC CATHETERIZATION     CARDIAC CATHETERIZATION N/A 08/19/2016   Procedure: Right/Left Heart Cath and Coronary Angiography;  Surgeon: Jolaine Artist, MD;  Location: [facility] CV LAB;  Service: Cardiovascular;  Laterality: N/A;   COLONOSCOPY WITH PROPOFOL N/A 06/16/2017   Procedure: COLONOSCOPY WITH PROPOFOL;  Surgeon: Mauri Pole, MD;  Location: [facility] ENDOSCOPY;  Service: Endoscopy;  Laterality: N/A;   DIRECT LARYNGOSCOPY N/A 09/18/2016   Procedure:  DIRECT LARYNGOSCOPY, BRONCHOSCOPY, REMOVAL OF INTUBATION GRANULOMA;  Surgeon: [name], MD;  Location[facility] OR;  Service: ENT;  Laterality: N/A;   DIRECT LARYNGOSCOPY N/A 10/20/2016   Procedure: EXTUBATION AND FLEXIBLE LARYNGOSCOPE;  Surgeon: [name], MD;  Location[facility];  Service: ENT;  Laterality: N/A;   DIRECT LARYNGOSCOPY N/A 10/29/2016   Procedure: DIRECT LARYNGOSCOPY;  Surgeon: [name], MD;  Location: [facility] OR;  Service: ENT;  Laterality: N/A;   ESOPHAGOGASTRODUODENOSCOPY  12/23/2011   Procedure: ESOPHAGOGASTRODUODENOSCOPY (EGD);  Surgeon: [name], MD;  Location: [facility];  Service: Endoscopy;  Laterality: N/A;   ESOPHAGOGASTRODUODENOSCOPY (EGD) WITH PROPOFOL N/A 06/16/2017   Procedure: ESOPHAGOGASTRODUODENOSCOPY (EGD) WITH PROPOFOL;  Surgeon: Mauri Pole, MD;  Location: [facility]OSCOPY;  Service: Endoscopy;  Laterality: N/A;   EXTUBATION (ENDOTRACHEAL) IN OR N/A 07/21/2016   Procedure: EXTUBATION (ENDOTRACHEAL) IN OR;  Surgeon: [name], MD;  Location: [place];  Service: ENT;  Laterality: N/A;   [place] STUDY  12/23/2011   Procedure: GIVENS CAPSULE STUDY;  Surgeon: [name], MD;  Location: [facility]OSCOPY;  Service: Endoscopy;  Laterality: N/A;   MICROLARYNGOSCOPY WITH LASER N/A 10/16/2016   Procedure: MICRODIRECTLARYNGOSCOPY WITH LASER ABLATION AND KENLOG INJECTION;  Surgeon: [name], MD;  Location: [place];  Service: ENT;  Laterality: N/A;   [place], most recent gen change by JA 4/12   TRACHEOSTOMY TUBE PLACEMENT N/A 10/29/2016   Procedure: TRACHEOSTOMY;  Surgeon: [name], MD;  Location: [place];  Service: ENT;  Laterality: N/A;   TUBAL LIGATION  04/01/2000     OB History   No obstetric history on file.      Home Medications    Prior to Admission medications   Medication Sig Start Date End Date Taking? Authorizing Provider  acetaminophen (TYLENOL) 325 MG tablet Take 325-650 mg by mouth every 6 (six)  hours as needed for mild pain, fever or headache.     [provider]  albuterol (PROVENTIL HFA;VENTOLIN HFA) 108 (90 Base) MCG/ACT inhaler Inhale 1-2 puffs into the lungs every 6 (six) hours as needed for wheezing or shortness of breath. 08/04/18   Riccio, Gardiner Rhyme, DO  allopurinol (ZYLOPRIM) 100 MG tablet Take 2 tablets (200 mg total) by mouth daily. 06/03/18   Zenia Resides, MD  ALPRAZolam Duanne Moron) 0.5 MG tablet Take 1 tablet (0.5 mg total) by mouth daily as needed for anxiety. 11/26/18   Steve Rattler, DO  aspirin EC 81 MG tablet Take 4 tablets (325 mg total) by mouth daily for 30 days. 03/12/19 04/11/19  Gareth Morgan, MD  azithromycin (ZITHROMAX) 250 MG tablet Take 1 tablet (250 mg total) by mouth daily. Take 1 every day until finished. 12/10/18   Mesner, Corene Cornea, MD  benzonatate (TESSALON) 100 MG capsule Take 1 capsule (100 mg total) by mouth 3 (three) times daily  as needed for cough. 12/10/18   Mesner, Corene Cornea, MD  cetirizine (ZYRTEC) 10 MG tablet Take 10 mg by mouth daily.    [provider]  ferrous sulfate 325 (65 FE) MG tablet TAKE 1 TABLET BY MOUTH TWICE DAILY WITH MEALS TO KEEP BLOOD COUNT UP Patient taking differently: Take 325 mg by mouth 2 (two) times daily with a meal.  08/26/16   Riccio, Gardiner Rhyme, DO  furosemide (LASIX) 40 MG tablet Take 1 tablet (40 mg total) by mouth 2 (two) times daily. 06/03/18   Zenia Resides, MD  guaiFENesin 300 MG/15ML SOLN Take 300 mg by mouth 2 (two) times daily. 08/04/18   Steve Rattler, DO  ipratropium-albuterol (DUONEB) 0.5-2.5 (3) MG/3ML SOLN USE 3 ML VIA NEBULIZER EVERY 6 HOURS 10/21/18   Riccio, Gardiner Rhyme, DO  latanoprost (XALATAN) 0.005 % ophthalmic solution Place one drop in both eyes at bedtime. 02/16/18   [provider]  magnesium hydroxide (MILK OF MAGNESIA) 400 MG/5ML suspension Take 60 mLs by mouth daily as needed for mild constipation.     [provider]  metoprolol tartrate (LOPRESSOR) 25 MG tablet TAKE  1 TABLET BY MOUTH EVERY 6 HOURS AS NEEDED FOR HEART FLUTTERING 10/20/18   Allred, Jeneen Rinks, MD  nitroGLYCERIN (NITROSTAT) 0.4 MG SL tablet Place 1 tablet (0.4 mg total) under the tongue every 5 (five) minutes as needed for chest pain. 08/04/18   Steve Rattler, DO  Olopatadine HCl 0.2 % SOLN Place one to two drops in each once daily as needed for itching. 02/17/18   [provider]  Phenylephrine-DM-GG (ROBAFEN CF MULTI-SYMPTOM COLD) 5-10-100 MG/5ML LIQD Take 10 mLs by mouth daily as needed (cold).     [provider]  polyethylene glycol (MIRALAX / GLYCOLAX) packet Take 17 g by mouth daily as needed for mild constipation. 11/26/18   Steve Rattler, DO  potassium chloride (K-DUR) 10 MEQ tablet Take 1 tablet (10 mEq total) by mouth daily. 02/05/18   Steve Rattler, DO  potassium chloride (K-DUR,KLOR-CON) 10 MEQ tablet TAKE 1 TABLET BY MOUTH DAILY 10/21/18   Lucila Maine C, DO  predniSONE (DELTASONE) 1 MG tablet Take 7 mg by mouth daily with breakfast.     [provider]  predniSONE (DELTASONE) 20 MG tablet 2 tabs po daily x 4 days 12/10/18   Mesner, Corene Cornea, MD  ranitidine (ZANTAC) 150 MG tablet Take 1 tablet (150 mg total) by mouth daily. 03/28/18   Muthersbaugh, Jarrett Soho, PA-C  rosuvastatin (CRESTOR) 20 MG tablet Take 1 tablet (20 mg total) by mouth daily. 06/03/18   Zenia Resides, MD  senna (SENOKOT) 8.6 MG TABS tablet Take 1 tablet by mouth daily as needed for mild constipation.    [provider]  sodium chloride (OCEAN) 0.65 % SOLN nasal spray Place 1 spray into both nostrils daily as needed for congestion.     [provider]  triamcinolone cream (KENALOG) 0.1 % Apply 1 application topically 2 (two) times daily. Patient taking differently: Apply 1 application topically 2 (two) times daily as needed (rash).  06/03/18   Zenia Resides, MD    Family History Family History  Problem Relation Age of Onset   Hypertension Mother    Stroke Mother     Heart disease Mother    Aneurysm Mother    CVA Mother    Hypertension Father    Stroke Father    Heart attack Father    Alcohol abuse Father  Hypertension Sister    Hypertension Brother    Colon cancer Brother    Cancer Brother    Diabetes Sister    Diabetes Brother    Heart attack Brother    Heart attack Brother    Heart disease Sister    Alcohol abuse Brother    Heart disease Daughter     Social History Social History   Tobacco Use   Smoking status: Former Smoker    Packs/day: 0.10    Years: 50.00    Pack years: 5.00    Types: Cigarettes    Last attempt to quit: 08/16/2011    Years since quitting: 7.5   Smokeless tobacco: Never Used   Tobacco comment: quit 2012  Substance Use Topics   Alcohol use: No    Comment: quit 08/16/2011   Drug use: No     Allergies   Ace inhibitors and Other   Review of Systems Review of Systems  Constitutional: Negative for fever.  HENT: Negative for sore throat.   Eyes: Negative for visual disturbance.  Respiratory: Positive for cough (chronic) and shortness of breath.   Cardiovascular: Negative for chest pain.  Gastrointestinal: Negative for abdominal pain, nausea and vomiting.  Genitourinary: Negative for difficulty urinating.  Musculoskeletal: Negative for back pain and neck pain.  Skin: Negative for rash.  Neurological: Positive for speech difficulty and numbness (tingling bilateral upper and lower ext hands and feet). Negative for dizziness, syncope, facial asymmetry, weakness, light-headedness and headaches.     Physical Exam Updated Vital Signs BP 121/72    Pulse 70    Temp 97.6 F (36.4 C) (Oral)    Resp 16    Wt 78.5 kg    SpO2 98%    BMI 27.93 kg/m   Physical Exam Vitals signs and nursing note reviewed.  Constitutional:      General: She is not in acute distress.    Appearance: She is well-developed. She is not diaphoretic.  HENT:     Head: Normocephalic and atraumatic.  Eyes:      General: No visual field deficit.    Conjunctiva/sclera: Conjunctivae normal.  Neck:     Musculoskeletal: Normal range of motion.  Cardiovascular:     Rate and Rhythm: Normal rate and regular rhythm.     Heart sounds: Normal heart sounds. No murmur. No friction rub. No gallop.   Pulmonary:     Effort: Pulmonary effort is normal. No respiratory distress.     Breath sounds: Normal breath sounds. No wheezing or rales.  Abdominal:     General: There is no distension.     Palpations: Abdomen is soft.     Tenderness: There is no abdominal tenderness. There is no guarding.  Musculoskeletal:        General: No tenderness.  Skin:    General: Skin is warm and dry.     Findings: No erythema or rash.  Neurological:     Mental Status: She is alert and oriented to person, place, and time.     GCS: GCS eye subscore is 4. GCS verbal subscore is 5. GCS motor subscore is 6.     Cranial Nerves: Cranial nerves are intact. No dysarthria.     Sensory: Sensation is intact.     Motor: Pronator drift (right upper extremity but reports pain in shoulder limiting strength) present.      ED Treatments / Results  Labs (all labs ordered are listed, but only abnormal results are displayed) Labs Reviewed  CBC -  Abnormal; Notable for the following components:      Result Value   RBC 3.15 (*)    Hemoglobin 8.9 (*)    HCT 30.5 (*)    MCHC 29.2 (*)    RDW 15.8 (*)    nRBC 0.5 (*)    All other components within normal limits  COMPREHENSIVE METABOLIC PANEL - Abnormal; Notable for the following components:   Glucose, Bld 110 (*)    BUN 43 (*)    Creatinine, Ser 1.66 (*)    Total Protein 6.4 (*)    GFR calc non Af Amer 30 (*)    GFR calc Af Amer 35 (*)    All other components within normal limits  URINALYSIS, ROUTINE W REFLEX MICROSCOPIC - Abnormal; Notable for the following components:   Color, Urine STRAW (*)    Specific Gravity, Urine 1.004 (*)    All other components within normal limits    PROTIME-INR  APTT  DIFFERENTIAL  RAPID URINE DRUG SCREEN, HOSP PERFORMED    EKG None  Radiology Dg Chest 2 View  Result Date: 03/12/2019 CLINICAL DATA:  Shortness of breath. EXAM: CHEST - 2 VIEW COMPARISON:  Radiographs of December 10, 2018. FINDINGS: Stable cardiomegaly. Right-sided pacemaker is unchanged in position. No pneumothorax or pleural effusion is noted. Tracheostomy tube is in grossly good position. Minimal left lingular scarring or subsegmental atelectasis is noted. Right lung is clear. Bony thorax is unremarkable. IMPRESSION: Minimal left lingular subsegmental atelectasis or scarring. Stable cardiomegaly. No other significant abnormality seen. Electronically Signed   By: Marijo Conception, M.D.   On: 03/12/2019 15:44   Ct Head Wo Contrast  Result Date: 03/12/2019 CLINICAL DATA:  Aphasia and slurred speech. EXAM: CT HEAD WITHOUT CONTRAST TECHNIQUE: Contiguous axial images were obtained from the base of the skull through the vertex without intravenous contrast. COMPARISON:  11/12/2016. FINDINGS: Brain: No evidence for acute infarction, hemorrhage, mass lesion, hydrocephalus, or extra-axial fluid. Mild atrophy. Hypoattenuation of white matter, likely small vessel disease. Vascular: Calcification of the cavernous internal carotid arteries consistent with cerebrovascular atherosclerotic disease. No signs of intracranial large vessel occlusion. Skull: Intact. Sinuses/Orbits: BILATERAL maxillary sinus opacities, greater on the RIGHT. Widening of the RIGHT maxillary ostium, possible RIGHT antrochoanal polyp. Frontal, ethmoid, and sphenoid sinuses clear. Negative orbits. Other: None. Compared with priors, similar appearance. IMPRESSION: Atrophy and small vessel disease. No acute intracranial findings. BILATERAL maxillary sinus opacities, greater on the RIGHT. Possible RIGHT antrochoanal polyp. Electronically Signed   By: Staci Righter M.D.   On: 03/12/2019 15:50    Procedures Procedures  (including critical care time)  Medications Ordered in ED Medications - No data to display   Initial Impression / Assessment and Plan / ED Course  I have reviewed the triage vital signs and the nursing notes.  Pertinent labs & imaging results that were available during my care of the patient were reviewed by me and considered in my medical decision making (see chart for details).         73 year old female with a history of CHF, complete heart block with pacemaker in place, who presents with word finding difficulties yesterday that have improved today.  On my history, reports that her cough and shortness of breath are unchanged since she had her prior tracheostomy placed.  Chest x-ray was completed showed no evidence of pneumonia.  She is afebrile.  History concerning for TIA or CVA on my exam.  CT head without acute abnormalities.  Labs show no significant abnormalities.  Neurology was consulted, who came to bedside and evaluated the patient.  Dr. Rory Percy recommends increase of her aspirin to 325 mg and outpatient neurology follow-up.  Possible TIA, less likely small stroke, possible other speech disturbance.  Per Neurology, concern for risks coming into hospital and low suspicion for significant abnormality.  Discussed with daughter. Patient discharged in stable condition with understanding of reasons to return.    Final Clinical Impressions(s) / ED Diagnoses   Final diagnoses:  Speech disturbance, unspecified type    ED Discharge Orders         Ordered    aspirin EC 81 MG tablet  Daily     03/12/19 1641           Gareth Morgan, MD 03/12/19 1656

## 2019-03-14 ENCOUNTER — Telehealth: Payer: Self-pay | Admitting: Family Medicine

## 2019-03-14 ENCOUNTER — Telehealth: Payer: Self-pay | Admitting: *Deleted

## 2019-03-14 NOTE — Telephone Encounter (Signed)
[name] wanted to touch base with Dr. [name].  She went to the ED with stroke symptoms on Friday and was told to f/u with neurologist.  I do not see a referral in the chart.   [name] wanted to let the MD know that she spoke with the patient today and her speech is better and she feels better.  Will forward to MD to place referral if appropriate. Christen Bame, CMA

## 2019-03-14 NOTE — Telephone Encounter (Signed)
I'm in clinic Wednesday and can call the patient then. There are also other physicians available tomorrow to have a virtual visit with if she would like to do it before Wednesday.

## 2019-03-14 NOTE — Telephone Encounter (Signed)
Pt's appt was cancelled for this coming Thursday. Pt would like a virtual visit to speak with Dr. [name]. Please call pt to discuss.

## 2019-03-15 NOTE — Telephone Encounter (Signed)
[name] informed. Christen Bame, CMA

## 2019-03-15 NOTE — Telephone Encounter (Signed)
[name] informed and she will relay message to pt today when she calls that [name] will call on Wednesday. Christen Bame, CMA

## 2019-03-15 NOTE — Telephone Encounter (Signed)
I feel it's reasonable to hold off for now on referral.

## 2019-03-16 ENCOUNTER — Telehealth (INDEPENDENT_AMBULATORY_CARE_PROVIDER_SITE_OTHER): Payer: Medicare Other | Admitting: Family Medicine

## 2019-03-16 ENCOUNTER — Other Ambulatory Visit: Payer: Self-pay

## 2019-03-16 DIAGNOSIS — R471 Dysarthria and anarthria: Secondary | ICD-10-CM | POA: Diagnosis not present

## 2019-03-16 NOTE — Progress Notes (Addendum)
NEUROLOGY CONSULTATION NOTE  [name] MRN: [id number] DOB: [birth year]  Referring provider: Jola Schmidt, MD (ED referral) Primary care provider: Lucila Maine, DO  Reason for consult:  Transient ischemic attack  HISTORY OF PRESENT ILLNESS: [name] is a 73 year old right-handed black woman with CHF, complete heart block and a fib status post PPM 1998, chronic atypical chest pain, hypertension, hyperlipidemia, Neri artery disease, depression, history of alcohol and tobacco abuse who presents for possible transient ischemic attack.  History supplemented by ED note.  On 03/11/19, she developed both word-finding difficulty as well as difficulty getting words out.  She was able to understand her daughter talking to her.  This was associated with headache, dizziness, shortness of breath and diffuse body pain.  She did not have visual disturbance or unilateral numbness and weakness.  By the next day, she continued to have speech difficulty but other symptoms resolved.  She presented to the ED for further evaluation.   CT head was personally reviewed and showed atrophy and small vessel ischemic changes but no acute ischemic or hemorrhagic stroke.  She has known atrial fibrillation with PPM who is followed by cardiology but not a candidate for anticoagulation due to prior GI bleed secondary to AVM of stomach and not a candidate for watchman.  She was evaluated by neurology who increased ASA from 81mg  to 325mg  daily.  Speech has improved but she still has trouble getting words out.    Last echocardiogram performed on 05/24/18 showed EF 65-70% with mild concentric hypertrophy, grade 2 diastolic dysfunction.  Last pacemaker device check from 09/23/18 was normal with no episodes recorded.  Hgb A1c from 11/26/18 was 6.4.  Lipid panel from 05/24/18 showed chol 222, TG 235, HDL 69 and LDL 106.  She takes Crestor 20mg , Lasix and metoprolol.  PAST MEDICAL HISTORY: Past Medical History:   Diagnosis Date  . Anemia   . Angina   . Angioedema    2/2 ACE  . Arteriovenous malformation of stomach   . Arthritis   . Asthma   . AVM (arteriovenous malformation) of colon    small intestine; stomach  . Blood transfusion   . CHF (congestive heart failure) ([name])   . Complete heart block ([place])    s/p PPM 1998  . Complication of anesthesia    Difficult airway; anaphylaxis and swelling with propofol  . DDD (degenerative disc disease)   . Depression   . Diastolic heart failure   . Fatty liver 07/26/10  . GERD (gastroesophageal reflux disease)   . GI bleed   . Headache   . History of alcohol abuse Stopped Fall 2012  . History of tobacco use Quit Fall 2012  . Hx of cardiovascular stress test    a. Lexiscan Myoview (10/15):  Small inferolateral and apical defect c/w scar and poss soft tissue attenuation, no ischemia, EF 42%  . Hx of colonic polyp 08/13/10   adenomatous  . Hx of colonoscopy   . Hyperlipidemia   . Hyperlipidemia   . Hypertension   . Hypertrophic cardiomyopathy ([place])    dx by Dr [name] 2009  . Iron deficiency anemia   . Myocardial infarction ([place])   . Panic attack   . Panic attacks   . Permanent atrial fibrillation   . Pneumonia   . Renal failure    baseline creatinine 1.6  . Right arm pain 01/08/2012  . RLS (restless legs syndrome)    Dx 06/2007  . Shortness of breath  sob on exertation  . Sleep apnea     PAST SURGICAL HISTORY: Past Surgical History:  Procedure Laterality Date  . CARDIAC CATHETERIZATION    . CARDIAC CATHETERIZATION N/A 08/19/2016   Procedure: Right/Left Heart Cath and Coronary Angiography;  Surgeon: Jolaine Artist, MD;  Location: [facility];  Service: Cardiovascular;  Laterality: N/A;  . COLONOSCOPY WITH PROPOFOL N/A 06/16/2017   Procedure: COLONOSCOPY WITH PROPOFOL;  Surgeon: Mauri Pole, MD;  Location: [facility];  Service: Endoscopy;  Laterality: N/A;  . DIRECT LARYNGOSCOPY N/A 09/18/2016   Procedure: DIRECT  LARYNGOSCOPY, BRONCHOSCOPY, REMOVAL OF INTUBATION GRANULOMA;  Surgeon: [name], MD;  Location: [facility] OR;  Service: ENT;  Laterality: N/A;  . DIRECT LARYNGOSCOPY N/A 10/20/2016   Procedure: EXTUBATION AND FLEXIBLE LARYNGOSCOPE;  Surgeon: [name], MD;  Location: [facility];  Service: ENT;  Laterality: N/A;  . DIRECT LARYNGOSCOPY N/A 10/29/2016   Procedure: DIRECT LARYNGOSCOPY;  Surgeon: [name], MD;  Location: [facility];  Service: ENT;  Laterality: N/A;  . ESOPHAGOGASTRODUODENOSCOPY  12/23/2011   Procedure: ESOPHAGOGASTRODUODENOSCOPY (EGD);  Surgeon: [name], MD;  Location: [facility];  Service: Endoscopy;  Laterality: N/A;  . ESOPHAGOGASTRODUODENOSCOPY (EGD) WITH PROPOFOL N/A 06/16/2017   Procedure: ESOPHAGOGASTRODUODENOSCOPY (EGD) WITH PROPOFOL;  Surgeon: Mauri Pole, MD;  Location: [facility];  Service: Endoscopy;  Laterality: N/A;  . EXTUBATION (ENDOTRACHEAL) IN OR N/A 07/21/2016   Procedure: EXTUBATION (ENDOTRACHEAL) IN OR;  Surgeon: [name], MD;  Location: [place];  Service: ENT;  Laterality: N/A;  . GIVENS CAPSULE STUDY  12/23/2011   Procedure: GIVENS CAPSULE STUDY;  Surgeon: [name], MD;  Location: [facility];  Service: Endoscopy;  Laterality: N/A;  . MICROLARYNGOSCOPY WITH LASER N/A 10/16/2016   Procedure: MICRODIRECTLARYNGOSCOPY WITH LASER ABLATION AND KENLOG INJECTION;  Surgeon: [name], MD;  Location: [facility];  Service: ENT;  Laterality: N/A;  . PACEMAKER INSERTION  1998   [facility], most recent gen change by [name] 4/12  . TRACHEOSTOMY TUBE PLACEMENT N/A 10/29/2016   Procedure: TRACHEOSTOMY;  Surgeon: [name], MD;  Location: [facility];  Service: ENT;  Laterality: N/A;  . TUBAL LIGATION  04/01/2000    MEDICATIONS: Current Outpatient Medications on File Prior to Visit  Medication Sig Dispense Refill  . acetaminophen (TYLENOL) 325 MG tablet Take 325-650 mg by mouth every 6 (six) hours as needed for mild pain, fever or headache.     . albuterol (PROVENTIL  HFA;VENTOLIN HFA) 108 (90 Base) MCG/ACT inhaler Inhale 1-2 puffs into the lungs every 6 (six) hours as needed for wheezing or shortness of breath. 1 Inhaler 6  . allopurinol (ZYLOPRIM) 100 MG tablet Take 2 tablets (200 mg total) by mouth daily. 180 tablet 3  . ALPRAZolam (XANAX) 0.5 MG tablet Take 1 tablet (0.5 mg total) by mouth daily as needed for anxiety. 90 tablet 2  . aspirin EC 81 MG tablet Take 4 tablets (325 mg total) by mouth daily for 30 days. 120 tablet 0  . azithromycin (ZITHROMAX) 250 MG tablet Take 1 tablet (250 mg total) by mouth daily. Take 1 every day until finished. 4 tablet 0  . benzonatate (TESSALON) 100 MG capsule Take 1 capsule (100 mg total) by mouth 3 (three) times daily as needed for cough. 21 capsule 0  . cetirizine (ZYRTEC) 10 MG tablet Take 10 mg by mouth daily.    . ferrous sulfate 325 (65 FE) MG tablet TAKE 1 TABLET BY MOUTH TWICE DAILY WITH MEALS TO KEEP BLOOD  COUNT UP (Patient taking differently: Take 325 mg by mouth 2 (two) times daily with a meal. ) 60 tablet 11  . furosemide (LASIX) 40 MG tablet Take 1 tablet (40 mg total) by mouth 2 (two) times daily. 180 tablet 3  . guaiFENesin 300 MG/15ML SOLN Take 300 mg by mouth 2 (two) times daily. 1000 mL 3  . ipratropium-albuterol (DUONEB) 0.5-2.5 (3) MG/3ML SOLN USE 3 ML VIA NEBULIZER EVERY 6 HOURS 360 mL 0  . latanoprost (XALATAN) 0.005 % ophthalmic solution Place one drop in both eyes at bedtime.  3  . magnesium hydroxide (MILK OF MAGNESIA) 400 MG/5ML suspension Take 60 mLs by mouth daily as needed for mild constipation.     . metoprolol tartrate (LOPRESSOR) 25 MG tablet TAKE 1 TABLET BY MOUTH EVERY 6 HOURS AS NEEDED FOR HEART FLUTTERING 360 tablet 3  . nitroGLYCERIN (NITROSTAT) 0.4 MG SL tablet Place 1 tablet (0.4 mg total) under the tongue every 5 (five) minutes as needed for chest pain. 25 tablet 1  . Olopatadine HCl 0.2 % SOLN Place one to two drops in each once daily as needed for itching.  1  . Phenylephrine-DM-GG  (ROBAFEN CF MULTI-SYMPTOM COLD) 5-10-100 MG/5ML LIQD Take 10 mLs by mouth daily as needed (cold).     . polyethylene glycol (MIRALAX / GLYCOLAX) packet Take 17 g by mouth daily as needed for mild constipation. 100 each 1  . potassium chloride (K-DUR) 10 MEQ tablet Take 1 tablet (10 mEq total) by mouth daily. 90 tablet 3  . potassium chloride (K-DUR,KLOR-CON) 10 MEQ tablet TAKE 1 TABLET BY MOUTH DAILY 90 tablet 0  . predniSONE (DELTASONE) 1 MG tablet Take 7 mg by mouth daily with breakfast.     . predniSONE (DELTASONE) 20 MG tablet 2 tabs po daily x 4 days 8 tablet 0  . ranitidine (ZANTAC) 150 MG tablet Take 1 tablet (150 mg total) by mouth daily. 15 tablet 0  . rosuvastatin (CRESTOR) 20 MG tablet Take 1 tablet (20 mg total) by mouth daily. 90 tablet 3  . senna (SENOKOT) 8.6 MG TABS tablet Take 1 tablet by mouth daily as needed for mild constipation.    . sodium chloride (OCEAN) 0.65 % SOLN nasal spray Place 1 spray into both nostrils daily as needed for congestion.     . triamcinolone cream (KENALOG) 0.1 % Apply 1 application topically 2 (two) times daily. (Patient taking differently: Apply 1 application topically 2 (two) times daily as needed (rash). ) 30 g 2   No current facility-administered medications on file prior to visit.     ALLERGIES: Allergies  Allergen Reactions  . Ace Inhibitors Swelling    angiodema  . Other Anaphylaxis and Swelling    Reaction to unspecified anesthesia     FAMILY HISTORY: Family History  Problem Relation Age of Onset  . Hypertension Mother   . Stroke Mother   . Heart disease Mother   . Aneurysm Mother   . CVA Mother   . Hypertension Father   . Stroke Father   . Heart attack Father   . Alcohol abuse Father   . Hypertension Sister   . Hypertension Brother   . Colon cancer Brother   . Cancer Brother   . Diabetes Sister   . Diabetes Brother   . Heart attack Brother   . Heart attack Brother   . Heart disease Sister   . Alcohol abuse Brother   .  Heart disease Daughter     SOCIAL  HISTORY: Social History   Socioeconomic History  . Marital status: Divorced    Spouse name: Not on file  . Number of children: 4  . Years of education: 9  . Highest education level: Not on file  Occupational History  . Occupation: disabled    Fish farm manager: UNEMPLOYED  . Occupation: previously- Development worker, community  Social Needs  . Financial resource strain: Not on file  . Food insecurity:    Worry: Not on file    Inability: Not on file  . Transportation needs:    Medical: Not on file    Non-medical: Not on file  Tobacco Use  . Smoking status: Former Smoker    Packs/day: 0.10    Years: 50.00    Pack years: 5.00    Types: Cigarettes    Last attempt to quit: 08/16/2011    Years since quitting: 7.5  . Smokeless tobacco: Never Used  . Tobacco comment: quit 2012  Substance and Sexual Activity  . Alcohol use: No    Comment: quit 08/16/2011  . Drug use: No  . Sexual activity: Never  Lifestyle  . Physical activity:    Days per week: Not on file    Minutes per session: Not on file  . Stress: Not on file  Relationships  . Social connections:    Talks on phone: Not on file    Gets together: Not on file    Attends religious service: Not on file    Active member of club or organization: Not on file    Attends meetings of clubs or organizations: Not on file    Relationship status: Not on file  . Intimate partner violence:    Fear of current or ex partner: Not on file    Emotionally abused: Not on file    Physically abused: Not on file    Forced sexual activity: Not on file  Other Topics Concern  . Not on file  Social History Narrative   On disability for heart failure/pacemaker.     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    4 grown children,  8 grandchildren.     Drinks alcohol a few times a week socially. At one time, the most she reports drinking is about 3 mixed drinks.   Lives with daughter [name] and son. Will be moving  04/2011 to different house because concerned children are taking advantage of her financial situation.    No illicit drugs.   Smokes few cigarettes daily, especially when stressed. Started smoking @ age 47. Quit January 2012 2/2 health but started smoking again 02/2011.       Health Care POA:    Emergency Contact: daughter, Verdene Lennert [phone]   End of Life Plan: gave pt AD info pamphlet   Who lives with you: no one- lives in section 8 housing   Any pets: none   Diet: Pt has a varied diet of protein, starch and vegetables   Exercise: Pt has no regular exercise routine.   Seatbelts: Pt reports wearing seatbelt when in vehicles.    Hobbies: dancing    REVIEW OF SYSTEMS: Constitutional: No fevers, chills, or sweats, no generalized fatigue, change in appetite Eyes: No visual changes, double vision, eye pain Ear, nose and throat: No hearing loss, ear pain, nasal congestion, sore throat Cardiovascular: No chest pain, palpitations Respiratory:  With tracheostomy, some shortness of breath GastrointestinaI: No nausea, vomiting, diarrhea, abdominal pain, fecal incontinence Genitourinary:  No dysuria, urinary retention or frequency Musculoskeletal:  No neck pain, back pain Integumentary: No rash, pruritus, skin lesions Neurological: as above Psychiatric: No depression, insomnia, anxiety Endocrine: No palpitations, fatigue, diaphoresis, mood swings, change in appetite, change in weight, increased thirst Hematologic/Lymphatic:  No purpura, petechiae. Allergic/Immunologic: no itchy/runny eyes, nasal congestion, recent allergic reactions, rashes  PHYSICAL EXAM: Blood pressure 116/78, pulse 70, temperature 98.3 F (36.8 C), weight 174 lb (78.9 kg), SpO2 99 %. General: No acute distress.  Patient appears well-groomed.  Head:  Normocephalic/atraumatic Eyes:  fundi examined but not visualized Neck: supple, no paraspinal tenderness, full range of motion Back: No paraspinal tenderness Heart: regular  rate and rhythm Lungs: Clear to auscultation bilaterally. Vascular: No carotid bruits. Neurological Exam: Mental status: alert and oriented to person, place, and time, recent and remote memory intact, fund of knowledge intact, attention and concentration intact, speech with some hesitancy when talking and naming.  Able to follow commands. Cranial nerves: CN I: not tested CN II: pupils equal, round and reactive to light, visual fields intact CN III, IV, VI:  full range of motion, no nystagmus, no ptosis CN V: facial sensation intact CN VII: Mild right lower facial droop which reportedly is chronic as per prior notes. CN VIII: hearing intact CN IX, X: gag intact, uvula midline CN XI: sternocleidomastoid and trapezius muscles intact CN XII: tongue midline Bulk & Tone: normal, no fasciculations. Motor:  5/5 throughout Sensation:  Light touch sensation intact. Deep Tendon Reflexes:  2+ throughout, toes downgoing. Finger to nose testing:  Without dysmetria.  Heel to shin:  Without dysmetria.  Gait:  Mildy wide based but steady.  Able to turn but unable to tandem walk. Romberg negative.  IMPRESSION: 1.  Mild expressive aphasia, improved but still residual.  No unilateral right sided weakness noted.  Possible stroke suspected. Unable to have MRI due to PPM.  Stroke risk factors include atrial fibrillation, hypertension, hyperlipidemia and former smoker and alcohol abuse.  Ideal secondary stroke management would be anticoagulation but reportedly not candidate due to prior GI bleed. 2.  Atrial fibrillation and complete heart block s/p a fib.  3.  Hypertension 4.  Hyperlipidemia.  PLAN: 1.  Continue ASA 325mg  daily for secondary stroke prevention. 2.  I would like to check CTA of head and neck to evaluate for any significant left MCA or ICA stenosis that may correspond with aphasia.  If present, would need to consider risks and benefits of short-term Plavix with ASA (for 3 months), or evaluation  by vascular for CEA if significant ICA stenosis noted (may not be candidate due to comorbidities). ADDENDUM:  CTA of head and neck performed today reveal no significant intracranial or extracranial arterial stenosis.  Continue ASA 325mg  daily for secondary stroke prevention. 3.  Continue Crestor 20mg  daily for now.  LDL goal should be less than 70.  Will repeat lipid panel 4.  She is to follow up with [name] of cardiology.  Consider repeat 2D echocardiogram. 5.  Continue optimized blood pressure management. 6.  Follow up in 3 months.  Thank you for allowing me to take part in the care of this patient.  Metta Clines, DO  CC:  Lucila Maine, DO  Skeet Latch, MD

## 2019-03-16 NOTE — Telephone Encounter (Signed)
Yes please

## 2019-03-16 NOTE — Progress Notes (Signed)
  [facility] Telemedicine Visit  Patient consented to have visit conducted via telephone.  Encounter participants: Patient: [name]  Provider: Steve Rattler  Others (if applicable): none  Chief Complaint: ED follow up   HPI:  Patient was seen in ED 3/28 with some dysarthria that had been going on for >24 hours. No other neuro deficits. A head CT was negative. Neuro was consulted and evaluated patient. They increased aspirin to 325 mg and recommended outpatient neuro visit. Cataract nurse reported speech was back to normal.   Patient reports she feels her speech is still off, she cannot get words out. It is not getting worse. No focal weakness. She has pain in shoulders from PMR. She reports her McKenzie got her an appointment with neurology. Checking the appt tab this is for tomorrow.   ROS: no fevers, chills. No facial droop, new weakness. Difficulty finding words.   Pertinent PMHx: HLD, HTN, T2DM  Exam:  Respiratory: speaks in full sentences Neuro: staccato speech  Assessment/Plan:  1. Dysarthria Continue asa 325 and statin. Follow up w/ neuro tomorrow. ED precautions reviewed. - Ambulatory referral to Neurology  Time spent on phone with patient: 5 minutes   Lucila Maine, DO PGY-3, [facility] 03/16/2019 12:44 PM

## 2019-03-16 NOTE — Telephone Encounter (Signed)
Unable to schedule a telemedicine visit this afternoon with Riccio due to no slots available. Do you want me to double book her this afternoon in the telemedicine?

## 2019-03-16 NOTE — Telephone Encounter (Signed)
Can we add her to the telehealth schedule for this afternoon somehow

## 2019-03-17 ENCOUNTER — Ambulatory Visit
Admission: RE | Admit: 2019-03-17 | Discharge: 2019-03-17 | Disposition: A | Discharge status: Home / Self Care | Payer: Medicare Other | Source: Ambulatory Visit | Attending: Neurology | Admitting: Neurology

## 2019-03-17 ENCOUNTER — Telehealth: Payer: Self-pay

## 2019-03-17 ENCOUNTER — Ambulatory Visit: Payer: Medicare Other | Admitting: Family Medicine

## 2019-03-17 ENCOUNTER — Ambulatory Visit (INDEPENDENT_AMBULATORY_CARE_PROVIDER_SITE_OTHER): Payer: Medicare Other | Admitting: Neurology

## 2019-03-17 ENCOUNTER — Other Ambulatory Visit: Payer: Self-pay

## 2019-03-17 ENCOUNTER — Encounter: Payer: Self-pay | Admitting: Neurology

## 2019-03-17 VITALS — BP 116/78 | HR 70 | Temp 98.3°F | Wt 174.0 lb

## 2019-03-17 DIAGNOSIS — I10 Essential (primary) hypertension: Secondary | ICD-10-CM

## 2019-03-17 DIAGNOSIS — R4701 Aphasia: Secondary | ICD-10-CM | POA: Diagnosis not present

## 2019-03-17 DIAGNOSIS — E7800 Pure hypercholesterolemia, unspecified: Secondary | ICD-10-CM

## 2019-03-17 DIAGNOSIS — I4821 Permanent atrial fibrillation: Secondary | ICD-10-CM | POA: Diagnosis not present

## 2019-03-17 DIAGNOSIS — I69320 Aphasia following cerebral infarction: Secondary | ICD-10-CM | POA: Diagnosis not present

## 2019-03-17 DIAGNOSIS — R202 Paresthesia of skin: Secondary | ICD-10-CM | POA: Diagnosis not present

## 2019-03-17 MED ORDER — IOPAMIDOL (ISOVUE-300) INJECTION 61%
75.0000 mL | Freq: Once | INTRAVENOUS | Status: AC | PRN
  Administered 2019-03-17: 75 mL via INTRAVENOUS

## 2019-03-17 NOTE — Telephone Encounter (Signed)
-----   Message from Pieter Partridge, DO sent at 03/17/2019  2:09 PM EDT ----- Reviewed CTA.  No significant narrowing of the arteries in the head and neck that could have caused stroke.

## 2019-03-17 NOTE — Patient Instructions (Addendum)
1.  I will try to get CTA of head and neck 2.  Continue aspirin 325mg  daily 3.  We will check a lipid panel. 4.  Follow up with [name] of cardiology 5.  Follow up with me in 3 months.  We have sent a referral to [facility] for your MRI and they will call you directly to schedule your appt. They are located at [place]. If you need to contact them directly please call [phone].

## 2019-03-17 NOTE — Telephone Encounter (Signed)
Called Pt and advised her of results

## 2019-03-24 ENCOUNTER — Other Ambulatory Visit: Payer: Self-pay | Admitting: Family Medicine

## 2019-04-04 DIAGNOSIS — N184 Chronic kidney disease, stage 4 (severe): Secondary | ICD-10-CM | POA: Diagnosis not present

## 2019-04-04 DIAGNOSIS — N189 Chronic kidney disease, unspecified: Secondary | ICD-10-CM | POA: Diagnosis not present

## 2019-04-05 ENCOUNTER — Encounter (HOSPITAL_COMMUNITY): Payer: Medicare Other

## 2019-04-05 IMAGING — DX DG CHEST 2V
2 series · 2 of 2 positions shown · non-contrast
Comparison: 06/14/2017

CLINICAL DATA: Productive cough for 1 week

EXAM:
CHEST  2 VIEW

[chest pa]
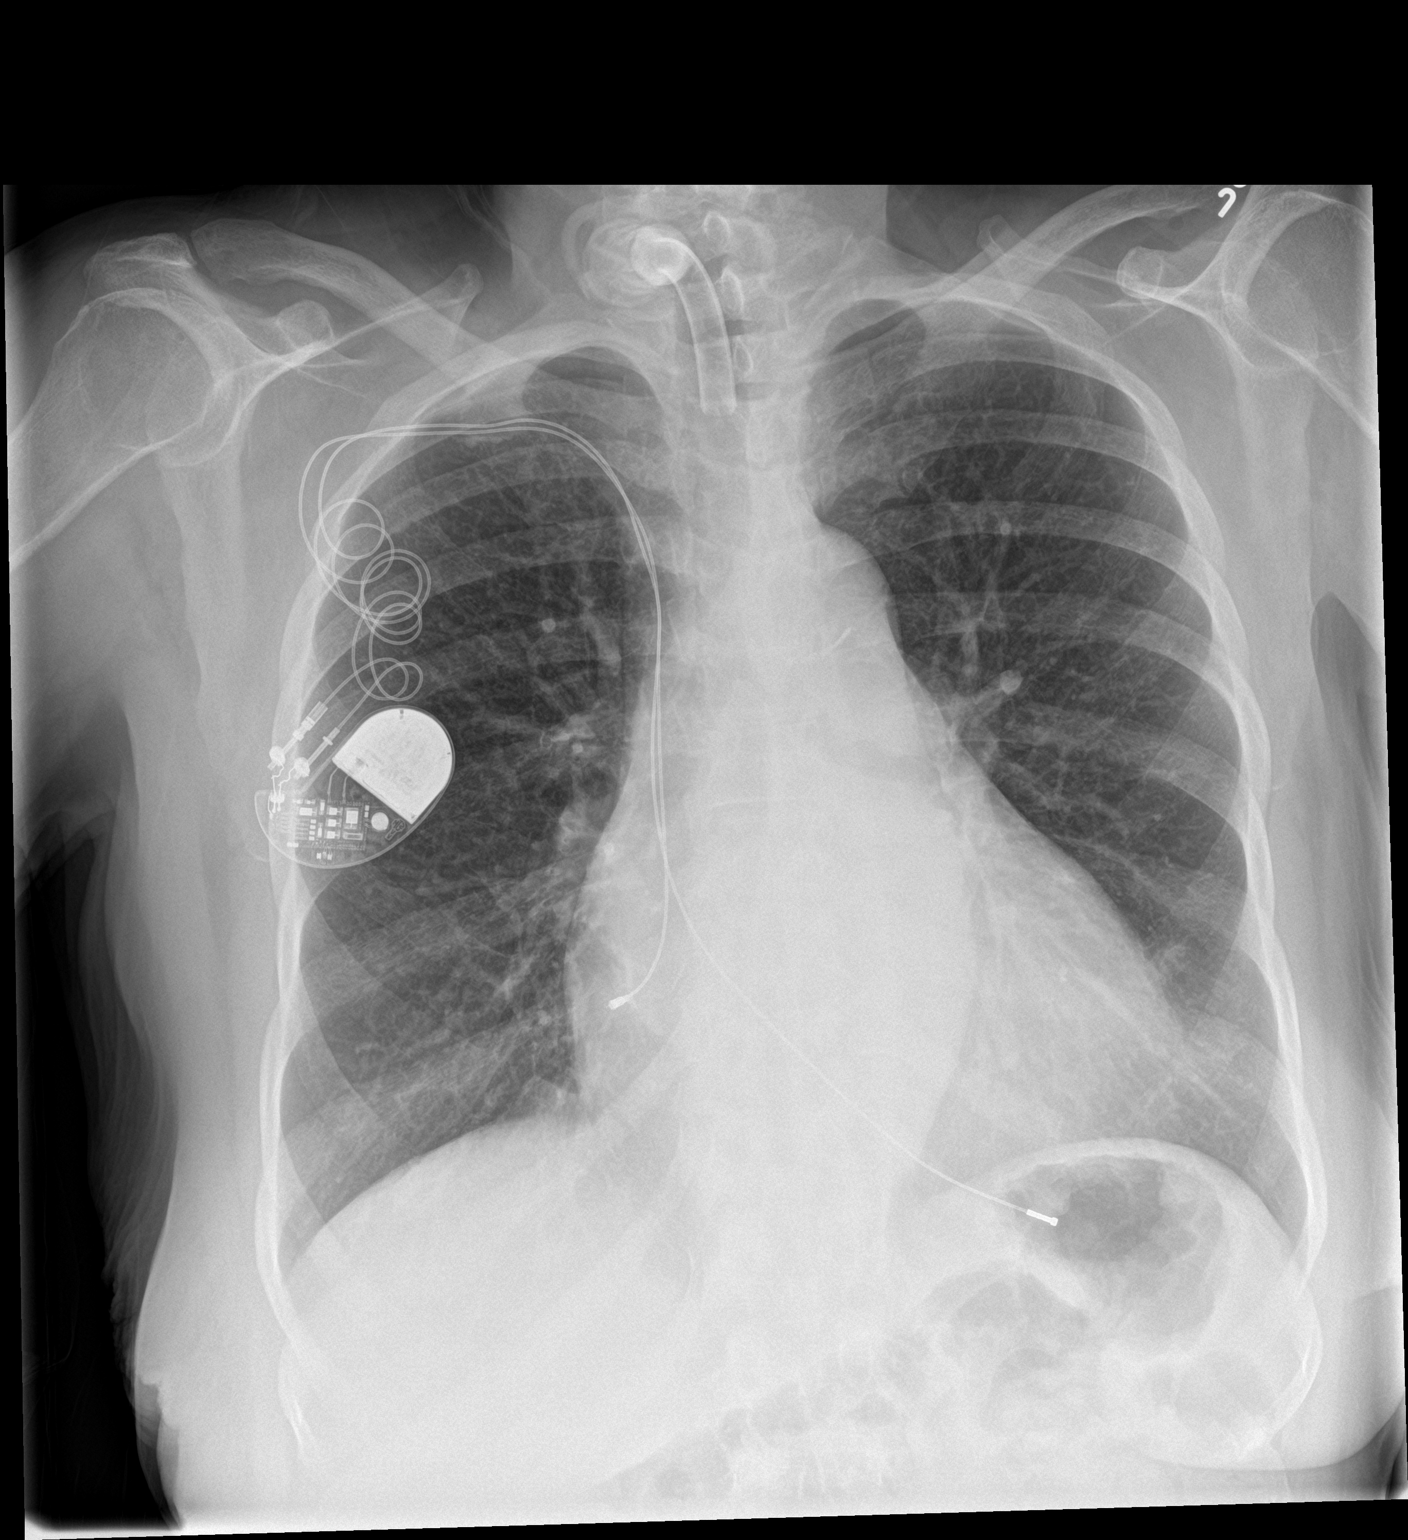

[chest lat]
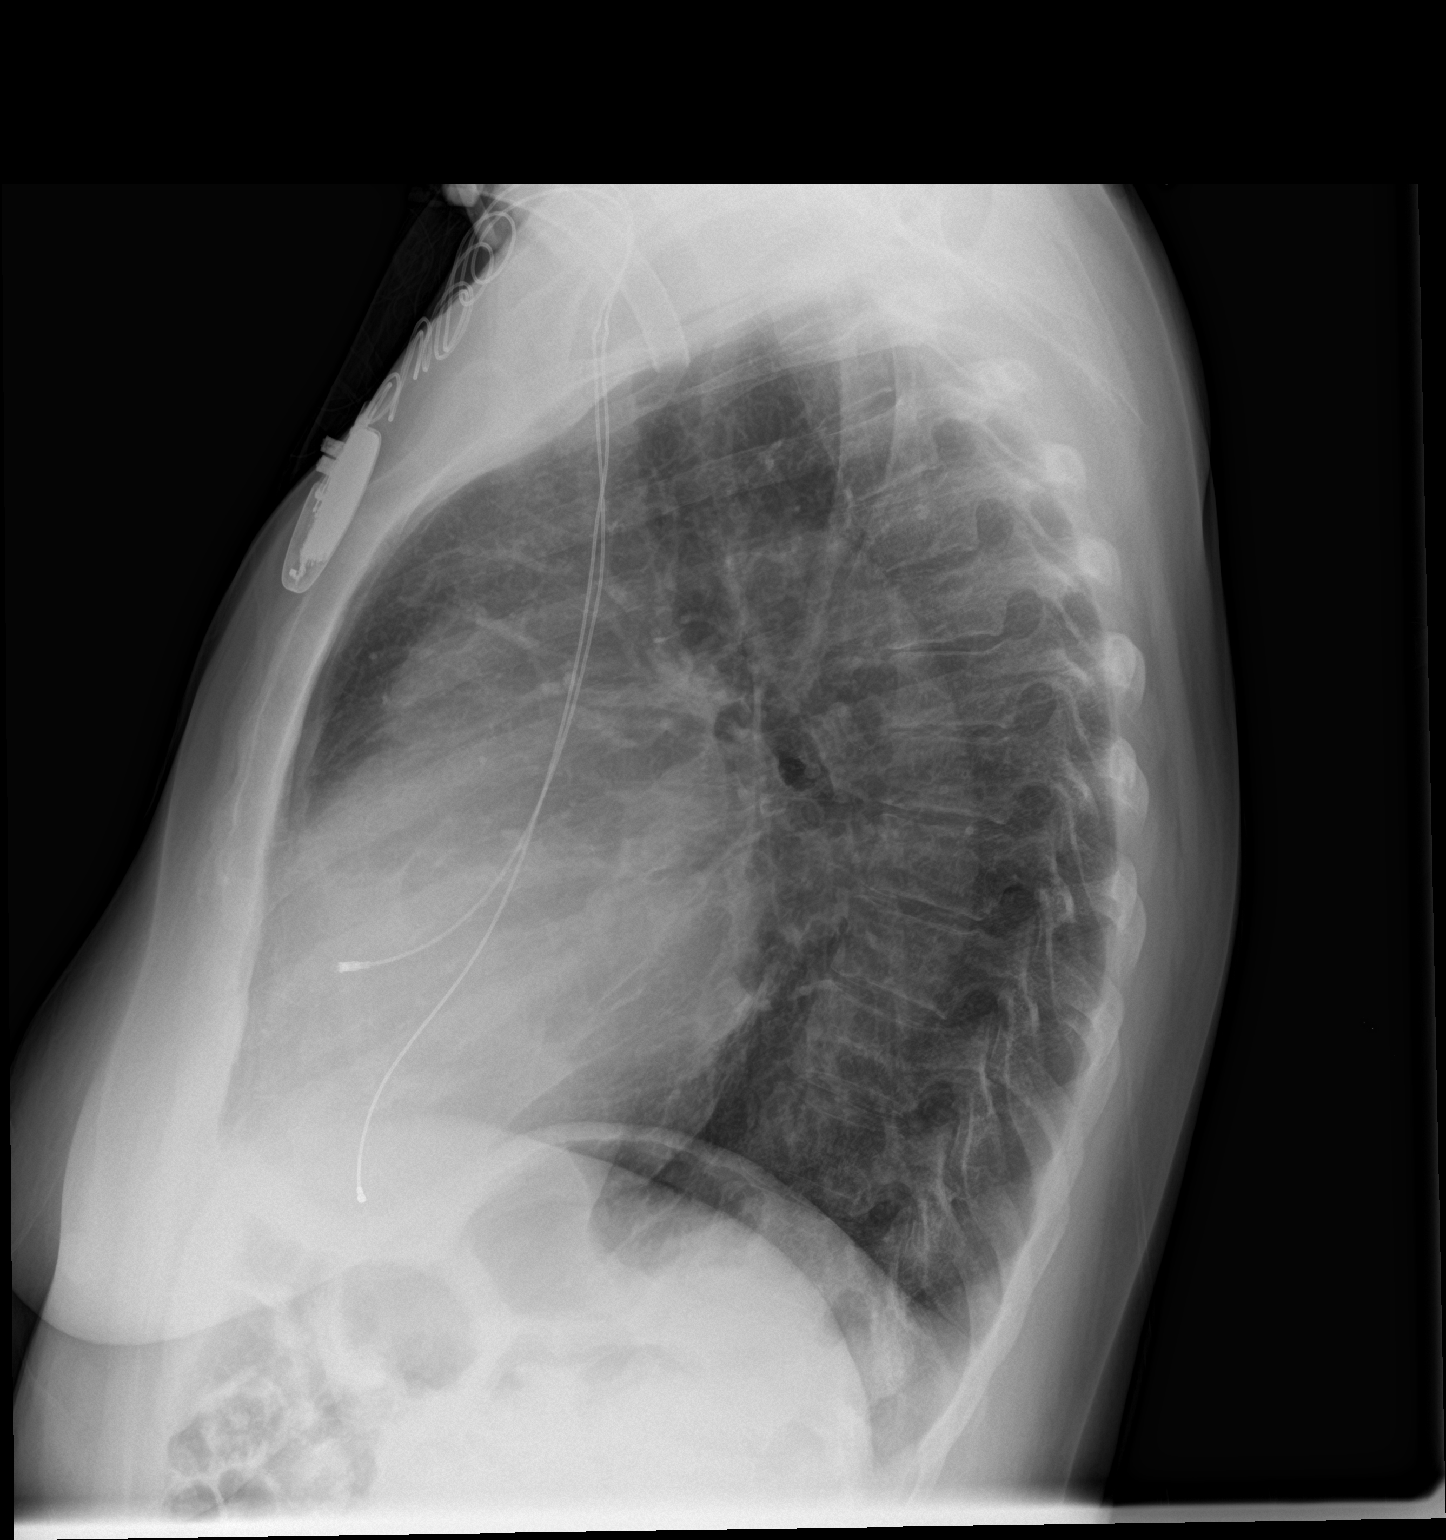

[2 of 2 positions shown; findings below may reference images not displayed]

FINDINGS: Tracheostomy and right pacer remain in place, unchanged. Stable
cardiomegaly. Lungs clear. No effusions or acute bony abnormality.
IMPRESSION: Cardiomegaly.  No active disease.

## 2019-04-10 ENCOUNTER — Emergency Department (HOSPITAL_BASED_OUTPATIENT_CLINIC_OR_DEPARTMENT_OTHER): Payer: Medicare Other

## 2019-04-10 ENCOUNTER — Encounter (HOSPITAL_COMMUNITY): Payer: Self-pay | Admitting: Emergency Medicine

## 2019-04-10 ENCOUNTER — Emergency Department (HOSPITAL_COMMUNITY)
Admission: EM | Admit: 2019-04-10 | Discharge: 2019-04-10 | Disposition: A | Discharge status: Home / Self Care | Payer: Medicare Other | Attending: Emergency Medicine | Admitting: Emergency Medicine

## 2019-04-10 ENCOUNTER — Emergency Department (HOSPITAL_COMMUNITY): Payer: Medicare Other

## 2019-04-10 ENCOUNTER — Other Ambulatory Visit: Payer: Self-pay

## 2019-04-10 DIAGNOSIS — I110 Hypertensive heart disease with heart failure: Secondary | ICD-10-CM | POA: Insufficient documentation

## 2019-04-10 DIAGNOSIS — I5030 Unspecified diastolic (congestive) heart failure: Secondary | ICD-10-CM | POA: Insufficient documentation

## 2019-04-10 DIAGNOSIS — M79604 Pain in right leg: Secondary | ICD-10-CM | POA: Diagnosis present

## 2019-04-10 DIAGNOSIS — R7989 Other specified abnormal findings of blood chemistry: Secondary | ICD-10-CM | POA: Diagnosis not present

## 2019-04-10 DIAGNOSIS — F329 Major depressive disorder, single episode, unspecified: Secondary | ICD-10-CM | POA: Diagnosis not present

## 2019-04-10 DIAGNOSIS — J45909 Unspecified asthma, uncomplicated: Secondary | ICD-10-CM | POA: Insufficient documentation

## 2019-04-10 DIAGNOSIS — R778 Other specified abnormalities of plasma proteins: Secondary | ICD-10-CM

## 2019-04-10 DIAGNOSIS — M79605 Pain in left leg: Secondary | ICD-10-CM | POA: Insufficient documentation

## 2019-04-10 DIAGNOSIS — R079 Chest pain, unspecified: Secondary | ICD-10-CM | POA: Diagnosis not present

## 2019-04-10 DIAGNOSIS — Z87891 Personal history of nicotine dependence: Secondary | ICD-10-CM | POA: Diagnosis not present

## 2019-04-10 DIAGNOSIS — M7989 Other specified soft tissue disorders: Secondary | ICD-10-CM

## 2019-04-10 DIAGNOSIS — R0602 Shortness of breath: Secondary | ICD-10-CM | POA: Diagnosis not present

## 2019-04-10 LAB — CBC
HCT: 30 % — ABNORMAL LOW (ref 36.0–46.0)
Hemoglobin: 9 g/dL — ABNORMAL LOW (ref 12.0–15.0)
MCH: 28.8 pg (ref 26.0–34.0)
MCHC: 30 g/dL (ref 30.0–36.0)
MCV: 96.2 fL (ref 80.0–100.0)
Platelets: 271 10*3/uL (ref 150–400)
RBC: 3.12 MIL/uL — ABNORMAL LOW (ref 3.87–5.11)
RDW: 15 % (ref 11.5–15.5)
WBC: 5.2 10*3/uL (ref 4.0–10.5)
nRBC: 0 % (ref 0.0–0.2)

## 2019-04-10 LAB — COMPREHENSIVE METABOLIC PANEL
ALT: 12 U/L (ref 0–44)
AST: 21 U/L (ref 15–41)
Albumin: 3.8 g/dL (ref 3.5–5.0)
Alkaline Phosphatase: 44 U/L (ref 38–126)
Anion gap: 11 (ref 5–15)
BUN: 42 mg/dL — ABNORMAL HIGH (ref 8–23)
CO2: 26 mmol/L (ref 22–32)
Calcium: 10 mg/dL (ref 8.9–10.3)
Chloride: 101 mmol/L (ref 98–111)
Creatinine, Ser: 1.74 mg/dL — ABNORMAL HIGH (ref 0.44–1.00)
GFR calc Af Amer: 33 mL/min — ABNORMAL LOW (ref >60)
GFR calc non Af Amer: 28 mL/min — ABNORMAL LOW (ref >60)
Glucose, Bld: 100 mg/dL — ABNORMAL HIGH (ref 70–99)
Potassium: 3.9 mmol/L (ref 3.5–5.1)
Sodium: 138 mmol/L (ref 135–145)
Total Bilirubin: 0.5 mg/dL (ref 0.3–1.2)
Total Protein: 6.7 g/dL (ref 6.5–8.1)

## 2019-04-10 LAB — TROPONIN I: Troponin I: 0.16 ng/mL (ref <0.03)

## 2019-04-10 LAB — D-DIMER, QUANTITATIVE: D-Dimer, Quant: 0.67 ug/mL-FEU — ABNORMAL HIGH (ref 0.00–0.50)

## 2019-04-10 LAB — BRAIN NATRIURETIC PEPTIDE: B Natriuretic Peptide: 254.7 pg/mL — ABNORMAL HIGH (ref 0.0–100.0)

## 2019-04-10 MED ORDER — ASPIRIN 81 MG PO CHEW
324.0000 mg | CHEWABLE_TABLET | Freq: Once | ORAL | Status: DC
  Filled 2019-04-10: qty 4

## 2019-04-10 NOTE — ED Provider Notes (Addendum)
[facility] Emergency Department Provider Note MRN:  [id number]  Arrival date & time: 04/10/19     Chief Complaint   Leg pain, chest pain History of Present Illness   [name] is a 73 y.o. year-old female with a history of COPD, CHF, tracheostomy tube dependence, DVT presenting to the ED with chief complaint of leg pain, chest pain.  Gradual onset bilateral leg pain yesterday, constant, moderate, no exacerbating relieving factors.  Followed soon after by left-sided chest pain, described as a dull ache, also constant.  Denies dizziness or lightheadedness, no nausea, no vomiting, no shortness of breath, no abdominal pain.  Feels similar to prior DVT.  Review of Systems  A complete 10 system review of systems was obtained and all systems are negative except as noted in the HPI and PMH.   Patient's Health History    Past Medical History:  Diagnosis Date  . Anemia   . Angina   . Angioedema    2/2 ACE  . Arteriovenous malformation of stomach   . Arthritis   . Asthma   . AVM (arteriovenous malformation) of colon    small intestine; stomach  . Blood transfusion   . CHF (congestive heart failure) ([place])   . Complete heart block ([place])    s/p PPM 1998  . Complication of anesthesia    Difficult airway; anaphylaxis and swelling with propofol  . DDD (degenerative disc disease)   . Depression   . Diastolic heart failure   . Fatty liver 07/26/10  . GERD (gastroesophageal reflux disease)   . GI bleed   . Headache   . History of alcohol abuse Stopped Fall 2012  . History of tobacco use Quit Fall 2012  . Hx of cardiovascular stress test    a. Lexiscan Myoview (10/15):  Small inferolateral and apical defect c/w scar and poss soft tissue attenuation, no ischemia, EF 42%  . Hx of colonic polyp 08/13/10   adenomatous  . Hx of colonoscopy   . Hyperlipidemia   . Hyperlipidemia   . Hypertension   . Hypertrophic cardiomyopathy ([place])    dx by Dr [name] 2009  .  Iron deficiency anemia   . Myocardial infarction ([place])   . Panic attack   . Panic attacks   . Permanent atrial fibrillation   . Pneumonia   . Renal failure    baseline creatinine 1.6  . Right arm pain 01/08/2012  . RLS (restless legs syndrome)    Dx 06/2007  . Shortness of breath    sob on exertation  . Sleep apnea     Past Surgical History:  Procedure Laterality Date  . CARDIAC CATHETERIZATION    . CARDIAC CATHETERIZATION N/A 08/19/2016   Procedure: Right/Left Heart Cath and Coronary Angiography;  Surgeon: Jolaine Artist, MD;  Location: [place] [facility];  Service: Cardiovascular;  Laterality: N/A;  . COLONOSCOPY WITH PROPOFOL N/A 06/16/2017   Procedure: COLONOSCOPY WITH PROPOFOL;  Surgeon: Mauri Pole, MD;  Location: [facility];  Service: Endoscopy;  Laterality: N/A;  . DIRECT LARYNGOSCOPY N/A 09/18/2016   Procedure: DIRECT LARYNGOSCOPY, BRONCHOSCOPY, REMOVAL OF INTUBATION GRANULOMA;  Surgeon: [name], MD;  Location: [facility] OR;  Service: ENT;  Laterality: N/A;  . DIRECT LARYNGOSCOPY N/A 10/20/2016   Procedure: EXTUBATION AND FLEXIBLE LARYNGOSCOPE;  Surgeon: [name], MD;  Location: [place];  Service: ENT;  Laterality: N/A;  . DIRECT LARYNGOSCOPY N/A 10/29/2016   Procedure: DIRECT LARYNGOSCOPY;  Surgeon: [name], MD;  Location: [facility]  OR;  Service: ENT;  Laterality: N/A;  . ESOPHAGOGASTRODUODENOSCOPY  12/23/2011   Procedure: ESOPHAGOGASTRODUODENOSCOPY (EGD);  Surgeon: [name], MD;  Location: [facility];  Service: Endoscopy;  Laterality: N/A;  . ESOPHAGOGASTRODUODENOSCOPY (EGD) WITH PROPOFOL N/A 06/16/2017   Procedure: ESOPHAGOGASTRODUODENOSCOPY (EGD) WITH PROPOFOL;  Surgeon: Mauri Pole, MD;  Location: [facility];  Service: Endoscopy;  Laterality: N/A;  . EXTUBATION (ENDOTRACHEAL) IN OR N/A 07/21/2016   Procedure: EXTUBATION (ENDOTRACHEAL) IN OR;  Surgeon: [name], MD;  Location: [place];  Service: ENT;  Laterality: N/A;  . GIVENS CAPSULE STUDY   12/23/2011   Procedure: GIVENS CAPSULE STUDY;  Surgeon: [name], MD;  Location: [facility];  Service: Endoscopy;  Laterality: N/A;  . MICROLARYNGOSCOPY WITH LASER N/A 10/16/2016   Procedure: MICRODIRECTLARYNGOSCOPY WITH LASER ABLATION AND KENLOG INJECTION;  Surgeon: [name], MD;  Location: [place];  Service: ENT;  Laterality: N/A;  . PACEMAKER INSERTION  1998   st jude, most recent gen change by [name] 4/12  . TRACHEOSTOMY TUBE PLACEMENT N/A 10/29/2016   Procedure: TRACHEOSTOMY;  Surgeon: [name], MD;  Location: [place];  Service: ENT;  Laterality: N/A;  . TUBAL LIGATION  04/01/2000    Family History  Problem Relation Age of Onset  . Hypertension Mother   . Stroke Mother   . Heart disease Mother   . Aneurysm Mother   . CVA Mother   . Hypertension Father   . Stroke Father   . Heart attack Father   . Alcohol abuse Father   . Hypertension Sister   . Hypertension Brother   . Colon cancer Brother   . Cancer Brother   . Diabetes Sister   . Diabetes Brother   . Heart attack Brother   . Heart attack Brother   . Heart disease Sister   . Alcohol abuse Brother   . Heart disease Daughter     Social History   Socioeconomic History  . Marital status: Divorced    Spouse name: Not on file  . Number of children: 4  . Years of education: 9  . Highest education level: Not on file  Occupational History  . Occupation: disabled    Fish farm manager: UNEMPLOYED  . Occupation: previously- Development worker, community  Social Needs  . Financial resource strain: Not on file  . Food insecurity:    Worry: Not on file    Inability: Not on file  . Transportation needs:    Medical: Not on file    Non-medical: Not on file  Tobacco Use  . Smoking status: Former Smoker    Packs/day: 0.10    Years: 50.00    Pack years: 5.00    Types: Cigarettes    Last attempt to quit: 08/16/2011    Years since quitting: 7.6  . Smokeless tobacco: Never Used  . Tobacco comment: quit 2012  Substance and Sexual Activity  .  Alcohol use: No    Comment: quit 08/16/2011  . Drug use: No  . Sexual activity: Never  Lifestyle  . Physical activity:    Days per week: Not on file    Minutes per session: Not on file  . Stress: Not on file  Relationships  . Social connections:    Talks on phone: Not on file    Gets together: Not on file    Attends religious service: Not on file    Active member of club or organization: Not on file    Attends meetings of clubs or organizations: Not  on file    Relationship status: Not on file  . Intimate partner violence:    Fear of current or ex partner: Not on file    Emotionally abused: Not on file    Physically abused: Not on file    Forced sexual activity: Not on file  Other Topics Concern  . Not on file  Social History Narrative   On disability for heart failure/pacemaker.     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    4 grown children,  8 grandchildren.     Drinks alcohol a few times a week socially. At one time, the most she reports drinking is about 3 mixed drinks.   Lives with daughter [name] and son. Will be moving 04/2011 to different house because concerned children are taking advantage of her financial situation.    No illicit drugs.   Smokes few cigarettes daily, especially when stressed. Started smoking @ age 69. Quit January 2012 2/2 health but started smoking again 02/2011.       Health Care POA:    Emergency Contact: daughter, Verdene Lennert [phone]   End of Life Plan: gave pt AD info pamphlet   Who lives with you: no one- lives in section 8 housing   Any pets: none   Diet: Pt has a varied diet of protein, starch and vegetables   Exercise: Pt has no regular exercise routine.   Seatbelts: Pt reports wearing seatbelt when in vehicles.    Hobbies: dancing     Physical Exam  Vital Signs and Nursing Notes reviewed Vitals:   04/10/19 1515 04/10/19 1537  BP: 137/67   Pulse: 71 76  Resp: 19 19  Temp:    SpO2: 100% 100%    CONSTITUTIONAL:  Chronically ill-appearing, NAD NEURO:  Alert and oriented x 3, no focal deficits EYES:  eyes equal and reactive ENT/NECK:  no LAD, no JVD; tracheostomy tube in place CARDIO: Regular rate, well-perfused, normal S1 and S2 PULM:  CTAB no wheezing or rhonchi GI/GU:  normal bowel sounds, non-distended, non-tender MSK/SPINE:  No gross deformities, no edema SKIN:  no rash, atraumatic PSYCH:  Appropriate speech and behavior  Diagnostic and Interventional Summary    EKG Interpretation  Date/Time:  Sunday April 10 2019 13:10:29 EDT Ventricular Rate:  70 PR Interval:    QRS Duration: 207 QT Interval:  491 QTC Calculation: 530 R Axis:   -90 Text Interpretation:  VENTRICULAR PACED RHYTHM no change from prior Confirmed by Gerlene Fee [phone]) on 04/10/2019 1:13:56 PM      Labs Reviewed  CBC - Abnormal; Notable for the following components:      Result Value   RBC 3.12 (*)    Hemoglobin 9.0 (*)    HCT 30.0 (*)    All other components within normal limits  COMPREHENSIVE METABOLIC PANEL - Abnormal; Notable for the following components:   Glucose, Bld 100 (*)    BUN 42 (*)    Creatinine, Ser 1.74 (*)    GFR calc non Af Amer 28 (*)    GFR calc Af Amer 33 (*)    All other components within normal limits  TROPONIN I - Abnormal; Notable for the following components:   Troponin I 0.16 (*)    All other components within normal limits  D-DIMER, QUANTITATIVE (NOT AT [facility]) - Abnormal; Notable for the following components:   D-Dimer, Quant 0.67 (*)    All other components within normal limits  BRAIN NATRIURETIC PEPTIDE - Abnormal; Notable for  the following components:   B Natriuretic Peptide 254.7 (*)    All other components within normal limits    VAS [place] LOWER EXTREMITY VENOUS (DVT) (ONLY [facility])      DG Chest Port 1 View  Final Result      Medications  aspirin chewable tablet 324 mg (0 mg Oral Hold 04/10/19 1614)     Procedures Critical Care  ED Course and Medical Decision  Making  I have reviewed the triage vital signs and the nursing notes.  Pertinent labs & imaging results that were available during my care of the patient were reviewed by me and considered in my medical decision making (see below for details).  Considering DVT/PE in this 73 year old female with history of DVT, not on anticoagulation.  Also considering ACS, COPD, pneumonia.  Patient is with chronic kidney disease, will begin with ultrasound of the legs and await labs to determine appropriateness of CTA imaging of the chest.  D-dimer is weakly positive, but is able to be age-adjusted and is therefore not a concern.  Patient's DVT ultrasound studies are negative.  No longer an indication for CTA imaging, which is likely not feasible given patient's creatinine.  Patient continues to have normal vital signs, no increased work of breathing.  Patient's troponin is 0.16, discussed with cardiology who given patient's complex medical history recommends hospitalist admission and trending of troponins.  Family medicine teaching service evaluating the patient, patient has had multiple elevated troponins in the past.  Family medicine to discuss case with cardiology to determine necessity of admission.  If both cardiology and family medicine are comfortable with discharge and can facilitate close follow-up, this would be an appropriate plan.  Case discussed with both cardiology and family medicine, given patient's chronic troponin elevation, she is appropriate for outpatient management.  Patient is agreeable to this plan and prefers discharge.  After the discussed management above, the patient was determined to be safe for discharge.  The patient was in agreement with this plan and all questions regarding their care were answered.  ED return precautions were discussed and the patient will return to the ED with any significant worsening of condition.  Barth Kirks. Sedonia Small, MD [place] [email or web address]  Final Clinical Impressions(s) / ED Diagnoses     ICD-10-CM   1. Elevated troponin R79.89   2. Chest pain R07.9 DG Chest [facility] 1 View    DG Chest [place] 1 View    ED Discharge Orders    None         Maudie Flakes, MD 04/10/19 1652    Maudie Flakes, MD 04/10/19 [phone]

## 2019-04-10 NOTE — ED Notes (Signed)
Pt ambulated with steady gait to the bathroom.

## 2019-04-10 NOTE — ED Notes (Signed)
Patient verbalizes understanding of discharge instructions. Opportunity for questioning and answers were provided. Armband removed by staff, pt discharged from ED via wheelchair to home.  

## 2019-04-10 NOTE — Consult Note (Signed)
Family Medicine Teaching Service Consult Note  Patient name: [name] Medical record number: [id number] Date of birth: 02-12-1946 Age: 73 y.o. Gender: female Date of Consult: 04/10/2019    Primary Care Provider: Steve Rattler, DO Consultants: FPTS, Cardiology  Indication for Consult: Bilateral Leg Pain  Consult Diagnoses/Problem List:  Bilateral Leg Pain Elevated Troponin  Disposition: home  Discharge Condition: stable/improved  Discharge Exam:  Physical Exam: General: 73 y.o. female in NAD, sitting up in bed, able to climb to edge of bed Cardio: HR 80, RRR Lungs: breathing comfortably on RA Skin: warm and dry Extremities: No edema, no TTP b/l calves  Brief Hospital Course:  [name] is a 73 y.o. female who presented to ED with bilateral leg pain.  She believed her pain to be similar to previous DVT that she had around 1980, per her report.  She reported that she had been having this pain for a few months, but this morning, she felt as though it worsening and she was afraid it was a DVT, so she came in.  She also noted intermittent chest pain that she describes as lasting a few seconds, comes and goes throughout the day at random times, is not associated with SOB or diaphoresis, and has been occurring for years.  Her EKG was without ischemic changes, troponin was 0.16, VSS without tachycardia, tachypnea, or oxygen desaturation.  BLE [place] negative for DVT.  On chart review, it appears that the patient had not had a negative troponin since 2013, and that most recent troponin was 0.27 in June 2019.  0.16 appears to be low for patient's baseline.  Cardiology had originally been consulted by ED physician, but given patient's history of elevated troponin, and lack of chest pain at present, discussed again with cardiologist Dr. Margaretann Loveless who agreed that patient was stable for discharge.  Wells Score with suspected DVT included is 1.5, low risk for PE.  Pain very  atypical in nature and appears chronic and unchanged per patient report with troponin not elevated from baseline, therefore doubt ACS.  Patient voiced that she would like to go home and felt comfortable with discharge.  She was discharged from the ED.  Issues for Follow Up:  1. Ensure chest pain does not change in nature 2. Follow up with cardiologist, PCP, and pulmonologist as scheduled.  Significant Procedures: none  Significant Labs and Imaging:  Recent Labs  Lab 04/10/19 1422  WBC 5.2  HGB 9.0*  HCT 30.0*  PLT 271   Recent Labs  Lab 04/10/19 1422  NA 138  K 3.9  CL 101  CO2 26  GLUCOSE 100*  BUN 42*  CREATININE 1.74*  CALCIUM 10.0  ALKPHOS 44  AST 21  ALT 12  ALBUMIN 3.8    Dg Chest Port 1 View  Result Date: 04/10/2019 CLINICAL DATA:  Shortness of breath and possible DVT. EXAM: PORTABLE CHEST 1 VIEW COMPARISON:  03/12/2019 FINDINGS: Stable mild to moderate cardiac enlargement. Stable appearance of pacemaker. Stable appearance of tracheostomy. There is no evidence of pulmonary edema, consolidation, pneumothorax, nodule or pleural fluid. IMPRESSION: Stable cardiac enlargement.  No acute findings. Electronically Signed   By: Aletta Edouard M.D.   On: 04/10/2019 14:37   Vas [place] Lower Extremity Venous (dvt) (only [facility])  Result Date: 04/10/2019  Lower Venous Study Indications: Swelling.  Comparison Study: Prior study from 03/20/18 is available for comparison  Examination Guidelines: A complete evaluation includes B-mode imaging, spectral Doppler, color Doppler, and power Doppler  as needed of all accessible portions of each vessel. Bilateral testing is considered an integral part of a complete examination. Limited examinations for reoccurring indications may be performed as noted.  +---------+---------------+---------+-----------+----------+-------+ RIGHT    CompressibilityPhasicitySpontaneityPropertiesSummary  +---------+---------------+---------+-----------+----------+-------+ CFV      Full           Yes      Yes                          +---------+---------------+---------+-----------+----------+-------+ SFJ      Full                                                 +---------+---------------+---------+-----------+----------+-------+ FV Prox  Full                                                 +---------+---------------+---------+-----------+----------+-------+ FV Mid   Full                                                 +---------+---------------+---------+-----------+----------+-------+ FV DistalFull                                                 +---------+---------------+---------+-----------+----------+-------+ PFV      Full                                                 +---------+---------------+---------+-----------+----------+-------+ POP      Full           Yes      Yes                          +---------+---------------+---------+-----------+----------+-------+ PTV      Full                                                 +---------+---------------+---------+-----------+----------+-------+ PERO     Full                                                 +---------+---------------+---------+-----------+----------+-------+   +---------+---------------+---------+-----------+----------+-------+ LEFT     CompressibilityPhasicitySpontaneityPropertiesSummary +---------+---------------+---------+-----------+----------+-------+ CFV      Full           Yes      Yes                          +---------+---------------+---------+-----------+----------+-------+ SFJ      Full                                                 +---------+---------------+---------+-----------+----------+-------+  FV Prox  Full                                                 +---------+---------------+---------+-----------+----------+-------+ FV Mid   Full                                                  +---------+---------------+---------+-----------+----------+-------+ FV DistalFull                                                 +---------+---------------+---------+-----------+----------+-------+ PFV      Full                                                 +---------+---------------+---------+-----------+----------+-------+ POP      Full           Yes      Yes                          +---------+---------------+---------+-----------+----------+-------+ PTV      Full                                                 +---------+---------------+---------+-----------+----------+-------+ PERO     Full                                                 +---------+---------------+---------+-----------+----------+-------+     Summary: Right: There is no evidence of deep vein thrombosis in the lower extremity. Left: There is no evidence of deep vein thrombosis in the lower extremity.  *See table(s) above for measurements and observations.    Preliminary      Results/Tests Pending at Time of Discharge: None  Discharge Medications:  Per ED Provider  Discharge Instructions: Per ED Provider  Follow-Up Appointments: Patient advised to follow up with cardiologist and PCP  Meccariello, Bernita Raisin, DO 04/10/2019, 5:46 PM PGY-1, [place]

## 2019-04-10 NOTE — ED Triage Notes (Signed)
Pt here with complaints of possible dvt to right leg and SOB. Pt has history of blood clots.

## 2019-04-10 NOTE — Discharge Instructions (Addendum)
You were evaluated in the Emergency Department and after careful evaluation, we did not find any emergent condition requiring admission or further testing in the hospital. ° °Please return to the Emergency Department if you experience any worsening of your condition.  We encourage you to follow up with a primary care provider.  Thank you for allowing us to be a part of your care. °

## 2019-04-10 NOTE — Progress Notes (Signed)
VASCULAR LAB PRELIMINARY  PRELIMINARY  PRELIMINARY  PRELIMINARY  Bilateral lower extremity venous duplex completed.    Preliminary report:  See CV proc for preliminary report.  Gave Dr. Sedonia Small report.   Rechy Bost, RVT 04/10/2019, 3:39 PM

## 2019-04-11 ENCOUNTER — Other Ambulatory Visit (HOSPITAL_COMMUNITY): Payer: Self-pay | Admitting: *Deleted

## 2019-04-11 DIAGNOSIS — I129 Hypertensive chronic kidney disease with stage 1 through stage 4 chronic kidney disease, or unspecified chronic kidney disease: Secondary | ICD-10-CM | POA: Diagnosis not present

## 2019-04-11 DIAGNOSIS — N184 Chronic kidney disease, stage 4 (severe): Secondary | ICD-10-CM | POA: Diagnosis not present

## 2019-04-11 DIAGNOSIS — N2581 Secondary hyperparathyroidism of renal origin: Secondary | ICD-10-CM | POA: Diagnosis not present

## 2019-04-11 DIAGNOSIS — D631 Anemia in chronic kidney disease: Secondary | ICD-10-CM | POA: Diagnosis not present

## 2019-04-12 ENCOUNTER — Ambulatory Visit (HOSPITAL_COMMUNITY)
Admission: RE | Admit: 2019-04-12 | Discharge: 2019-04-12 | Disposition: A | Discharge status: Home / Self Care | Payer: Medicare Other | Source: Ambulatory Visit | Attending: Nephrology | Admitting: Nephrology

## 2019-04-12 ENCOUNTER — Other Ambulatory Visit: Payer: Self-pay

## 2019-04-12 VITALS — BP 125/69 | HR 70 | Temp 98.6°F | Resp 70 | Ht 63.0 in | Wt 170.0 lb

## 2019-04-12 DIAGNOSIS — N183 Chronic kidney disease, stage 3 unspecified: Secondary | ICD-10-CM

## 2019-04-12 MED ORDER — SODIUM CHLORIDE 0.9 % IV SOLN
510.0000 mg | INTRAVENOUS | Status: DC
  Administered 2019-04-12: 510 mg via INTRAVENOUS
  Filled 2019-04-12: qty 510

## 2019-04-12 MED ORDER — EPOETIN ALFA-EPBX 10000 UNIT/ML IJ SOLN
10000.0000 [IU] | INTRAMUSCULAR | Status: DC
  Administered 2019-04-12: 10000 [IU] via SUBCUTANEOUS
  Filled 2019-04-12: qty 1

## 2019-04-13 ENCOUNTER — Telehealth: Payer: Self-pay | Admitting: *Deleted

## 2019-04-13 DIAGNOSIS — R471 Dysarthria and anarthria: Secondary | ICD-10-CM

## 2019-04-13 LAB — POCT HEMOGLOBIN-HEMACUE: Hemoglobin: 9.1 g/dL — ABNORMAL LOW (ref 12.0–15.0)

## 2019-04-13 NOTE — Telephone Encounter (Signed)
  Referral made for speech Patient does have chronic cough/mucous, would not treat unless she has a fever or new oxygen requirement. She could come in to be seen if she gets worse in any way.  Lucila Maine, DO PGY-3, [facility] 04/13/2019 8:16 PM

## 2019-04-13 NOTE — Telephone Encounter (Signed)
[name] wanted to call and report 2 things:  1. pt is having cough and congestion with yellow mucus.  No fever that she is aware of.  She was not sure of MD wanted to Rx her something.  2. Still having difficulty expressing words and she would like a referral to speech therapy  Christen Bame, CMA

## 2019-04-18 ENCOUNTER — Other Ambulatory Visit (HOSPITAL_COMMUNITY): Payer: Self-pay | Admitting: *Deleted

## 2019-04-18 ENCOUNTER — Other Ambulatory Visit: Payer: Self-pay

## 2019-04-18 DIAGNOSIS — J386 Stenosis of larynx: Secondary | ICD-10-CM | POA: Diagnosis not present

## 2019-04-18 DIAGNOSIS — G4733 Obstructive sleep apnea (adult) (pediatric): Secondary | ICD-10-CM | POA: Diagnosis not present

## 2019-04-18 DIAGNOSIS — Z430 Encounter for attention to tracheostomy: Secondary | ICD-10-CM | POA: Diagnosis not present

## 2019-04-18 DIAGNOSIS — Z930 Tracheostomy status: Secondary | ICD-10-CM | POA: Diagnosis not present

## 2019-04-19 ENCOUNTER — Ambulatory Visit (HOSPITAL_COMMUNITY)
Admission: RE | Admit: 2019-04-19 | Discharge: 2019-04-19 | Disposition: A | Discharge status: Home / Self Care | Payer: Medicare Other | Source: Ambulatory Visit | Attending: Nephrology | Admitting: Nephrology

## 2019-04-19 DIAGNOSIS — Z1159 Encounter for screening for other viral diseases: Secondary | ICD-10-CM | POA: Insufficient documentation

## 2019-04-19 DIAGNOSIS — D631 Anemia in chronic kidney disease: Secondary | ICD-10-CM | POA: Diagnosis not present

## 2019-04-19 DIAGNOSIS — N184 Chronic kidney disease, stage 4 (severe): Secondary | ICD-10-CM | POA: Insufficient documentation

## 2019-04-19 MED ORDER — SODIUM CHLORIDE 0.9 % IV SOLN
510.0000 mg | Freq: Once | INTRAVENOUS | Status: AC
  Administered 2019-04-19: 510 mg via INTRAVENOUS
  Filled 2019-04-19: qty 510

## 2019-04-20 ENCOUNTER — Other Ambulatory Visit: Payer: Self-pay

## 2019-04-20 ENCOUNTER — Other Ambulatory Visit: Payer: Self-pay | Admitting: Family Medicine

## 2019-04-20 ENCOUNTER — Emergency Department (HOSPITAL_COMMUNITY)
Admission: EM | Admit: 2019-04-20 | Discharge: 2019-04-20 | Disposition: A | Discharge status: Home / Self Care | Payer: Medicare Other | Attending: Emergency Medicine | Admitting: Emergency Medicine

## 2019-04-20 ENCOUNTER — Encounter (HOSPITAL_COMMUNITY): Payer: Self-pay | Admitting: Emergency Medicine

## 2019-04-20 ENCOUNTER — Emergency Department (HOSPITAL_COMMUNITY): Payer: Medicare Other

## 2019-04-20 ENCOUNTER — Telehealth: Payer: Self-pay | Admitting: *Deleted

## 2019-04-20 DIAGNOSIS — Z87891 Personal history of nicotine dependence: Secondary | ICD-10-CM | POA: Insufficient documentation

## 2019-04-20 DIAGNOSIS — Z950 Presence of cardiac pacemaker: Secondary | ICD-10-CM | POA: Insufficient documentation

## 2019-04-20 DIAGNOSIS — I252 Old myocardial infarction: Secondary | ICD-10-CM | POA: Insufficient documentation

## 2019-04-20 DIAGNOSIS — Z930 Tracheostomy status: Secondary | ICD-10-CM | POA: Diagnosis not present

## 2019-04-20 DIAGNOSIS — Z0389 Encounter for observation for other suspected diseases and conditions ruled out: Secondary | ICD-10-CM | POA: Diagnosis not present

## 2019-04-20 DIAGNOSIS — Z79899 Other long term (current) drug therapy: Secondary | ICD-10-CM | POA: Diagnosis not present

## 2019-04-20 DIAGNOSIS — I5032 Chronic diastolic (congestive) heart failure: Secondary | ICD-10-CM | POA: Insufficient documentation

## 2019-04-20 DIAGNOSIS — N183 Chronic kidney disease, stage 3 (moderate): Secondary | ICD-10-CM | POA: Insufficient documentation

## 2019-04-20 DIAGNOSIS — R0989 Other specified symptoms and signs involving the circulatory and respiratory systems: Secondary | ICD-10-CM | POA: Diagnosis not present

## 2019-04-20 DIAGNOSIS — R0602 Shortness of breath: Secondary | ICD-10-CM | POA: Diagnosis not present

## 2019-04-20 DIAGNOSIS — I130 Hypertensive heart and chronic kidney disease with heart failure and stage 1 through stage 4 chronic kidney disease, or unspecified chronic kidney disease: Secondary | ICD-10-CM | POA: Diagnosis not present

## 2019-04-20 DIAGNOSIS — J386 Stenosis of larynx: Secondary | ICD-10-CM | POA: Diagnosis not present

## 2019-04-20 MED ORDER — ALBUTEROL SULFATE HFA 108 (90 BASE) MCG/ACT IN AERS
4.0000 | INHALATION_SPRAY | Freq: Once | RESPIRATORY_TRACT | Status: AC
  Administered 2019-04-20: 4 via RESPIRATORY_TRACT
  Filled 2019-04-20: qty 6.7

## 2019-04-20 MED ORDER — IPRATROPIUM-ALBUTEROL 0.5-2.5 (3) MG/3ML IN SOLN
RESPIRATORY_TRACT | Status: AC
  Filled 2019-04-20: qty 6

## 2019-04-20 NOTE — ED Notes (Signed)
Patient verbalizes understanding of discharge instructions. Opportunity for questioning and answers were provided. Armband removed by staff, pt discharged from ED in wheelchair.  

## 2019-04-20 NOTE — Discharge Instructions (Addendum)
Your xray was normal today.  You were evaluated in the Emergency Department by the ENT physician and he looked with a camera in your nose and tracheostomy - there were no blockages.  You can take mucinex, available over the counter for congestion.  You can use your albuterol inhaler as needed at home.  Get rechecked if you have any new or concerning symptoms.

## 2019-04-20 NOTE — Procedures (Signed)
Anesth: Topical with 4% lidocaine Compl: None Findings: Normal trachea and primary bronchi.  Normal pharynx.  Larynx with some decreased motion of left side but otherwise normal.  Can not visualize subglottis. Description:  After discussing risks, benefits, and alternatives, the patient was placed in a seated position and the right nasal passage was packed with cotton soaked with topical anesthetic.  The fiberoptic scope was passed first through the tracheostomy tube to visualize the trachea and primary bronchi.  The scope was then passed through the right nasal passage to view the pharynx and larynx.  Findings are noted above.  The scope was then removed and he was returned to nursing care in stable condition.

## 2019-04-20 NOTE — ED Notes (Signed)
ENT Dr. Redmond Baseman @ Bedside

## 2019-04-20 NOTE — ED Triage Notes (Signed)
Patient arrived POV. Complaints of shortness of breath after eating chicken. Patient had trach. Hypotensive upon arrival to room. EDP at bedisde.

## 2019-04-20 NOTE — Telephone Encounter (Signed)
[name] wanted to let MD know that pt is having continued difficulty with aphasia.  She wants to know what MD thinks next steps should be.  She is requesting a call back form MD Christen Bame, CMA

## 2019-04-20 NOTE — ED Notes (Signed)
[name] pts son [phone] wants an update when one is available, he dropped the pt off here at the ED

## 2019-04-20 NOTE — Telephone Encounter (Signed)
Patient saw neuro in April, their recommendations were to take 325 mg aspirin daily and continue cholesterol medication, they wanted to see her back in 3 months (July). I put in referral to speech therapy so that would be the next steps to meet with them and work with speech. If there are new deficits that come up she should go to the ED. Thanks!

## 2019-04-20 NOTE — Consult Note (Signed)
Reason for Consult: Airway concern Referring Physician: ER  [name] is an 73 y.o. female.  HPI: 73 year old female with long-standing tracheostomy related to laryngeal stenosis last seen in our office two days ago for a routine trach tube change.  This evening, about one hour after eating, she had an acute episode of difficulty breathing and tried to suction herself without full relief.  She felt extra mucus in her airway.  She was brought to the ER by family and has been stable here without desaturation and with easy suctioning.  Her voice was hoarse around the event but has returned to baseline.  She still feels like there is something in her airway.  Past Medical History:  Diagnosis Date  . Anemia   . Angina   . Angioedema    2/2 ACE  . Arteriovenous malformation of stomach   . Arthritis   . Asthma   . AVM (arteriovenous malformation) of colon    small intestine; stomach  . Blood transfusion   . CHF (congestive heart failure) ([place])   . Complete heart block ([place])    s/p PPM 1998  . Complication of anesthesia    Difficult airway; anaphylaxis and swelling with propofol  . DDD (degenerative disc disease)   . Depression   . Diastolic heart failure   . Fatty liver 07/26/10  . GERD (gastroesophageal reflux disease)   . GI bleed   . Headache   . History of alcohol abuse Stopped Fall 2012  . History of tobacco use Quit Fall 2012  . Hx of cardiovascular stress test    a. Lexiscan Myoview (10/15):  Small inferolateral and apical defect c/w scar and poss soft tissue attenuation, no ischemia, EF 42%  . Hx of colonic polyp 08/13/10   adenomatous  . Hx of colonoscopy   . Hyperlipidemia   . Hyperlipidemia   . Hypertension   . Hypertrophic cardiomyopathy ([place])    dx by Dr [name] 2009  . Iron deficiency anemia   . Myocardial infarction ([place])   . Panic attack   . Panic attacks   . Permanent atrial fibrillation   . Pneumonia   . Renal failure    baseline creatinine 1.6  .  Right arm pain 01/08/2012  . RLS (restless legs syndrome)    Dx 06/2007  . Shortness of breath    sob on exertation  . Sleep apnea     Past Surgical History:  Procedure Laterality Date  . CARDIAC CATHETERIZATION    . CARDIAC CATHETERIZATION N/A 08/19/2016   Procedure: Right/Left Heart Cath and Coronary Angiography;  Surgeon: Jolaine Artist, MD;  Location: [facility];  Service: Cardiovascular;  Laterality: N/A;  . COLONOSCOPY WITH PROPOFOL N/A 06/16/2017   Procedure: COLONOSCOPY WITH PROPOFOL;  Surgeon: Mauri Pole, MD;  Location: [facility];  Service: Endoscopy;  Laterality: N/A;  . DIRECT LARYNGOSCOPY N/A 09/18/2016   Procedure: DIRECT LARYNGOSCOPY, BRONCHOSCOPY, REMOVAL OF INTUBATION GRANULOMA;  Surgeon: [name], MD;  Location: [facility];  Service: ENT;  Laterality: N/A;  . DIRECT LARYNGOSCOPY N/A 10/20/2016   Procedure: EXTUBATION AND FLEXIBLE LARYNGOSCOPE;  Surgeon: [name], MD;  Location: [facility];  Service: ENT;  Laterality: N/A;  . DIRECT LARYNGOSCOPY N/A 10/29/2016   Procedure: DIRECT LARYNGOSCOPY;  Surgeon: [name], MD;  Location: [facility] OR;  Service: ENT;  Laterality: N/A;  . ESOPHAGOGASTRODUODENOSCOPY  12/23/2011   Procedure: ESOPHAGOGASTRODUODENOSCOPY (EGD);  Surgeon: [name], MD;  Location: [facility];  Service: Endoscopy;  Laterality: N/A;  . ESOPHAGOGASTRODUODENOSCOPY (EGD) WITH PROPOFOL N/A 06/16/2017   Procedure: ESOPHAGOGASTRODUODENOSCOPY (EGD) WITH PROPOFOL;  Surgeon: Mauri Pole, MD;  [facility];  Service: Endoscopy;  Laterality: N/A;  . EXTUBATION (ENDOTRACHEAL) IN OR N/A 07/21/2016   Procedure: EXTUBATION (ENDOTRACHEAL) IN OR;  Surgeon: [name], MD;  Location: [place];  Service: ENT;  Laterality: N/A;  . GIVENS CAPSULE STUDY  12/23/2011   Procedure: GIVENS CAPSULE STUDY;  Surgeon: [name], MD;  [facility];  Service: Endoscopy;  Laterality: N/A;  . MICROLARYNGOSCOPY WITH LASER N/A 10/16/2016   Procedure:  MICRODIRECTLARYNGOSCOPY WITH LASER ABLATION AND KENLOG INJECTION;  Surgeon: [name], MD;  Location: [place];  Service: ENT;  Laterality: N/A;  . PACEMAKER INSERTION  1998   st jude, most recent gen change by [name] 4/12  . TRACHEOSTOMY TUBE PLACEMENT N/A 10/29/2016   Procedure: TRACHEOSTOMY;  Surgeon: [name], MD;  Location: [place];  Service: ENT;  Laterality: N/A;  . TUBAL LIGATION  04/01/2000    Family History  Problem Relation Age of Onset  . Hypertension Mother   . Stroke Mother   . Heart disease Mother   . Aneurysm Mother   . CVA Mother   . Hypertension Father   . Stroke Father   . Heart attack Father   . Alcohol abuse Father   . Hypertension Sister   . Hypertension Brother   . Colon cancer Brother   . Cancer Brother   . Diabetes Sister   . Diabetes Brother   . Heart attack Brother   . Heart attack Brother   . Heart disease Sister   . Alcohol abuse Brother   . Heart disease Daughter     Social History:  reports that she quit smoking about 7 years ago. Her smoking use included cigarettes. She has a 5.00 pack-year smoking history. She has never used smokeless tobacco. She reports that she does not drink alcohol or use drugs.  Allergies:  Allergies  Allergen Reactions  . Ace Inhibitors Swelling    angiodema  . Other Anaphylaxis and Swelling    Reaction to unspecified anesthesia     Medications: I have reviewed the patient's current medications.  No results found. However, due to the size of the patient record, not all encounters were searched. Please check Results Review for a complete set of results.  Dg Chest Port 1 View  Result Date: 04/20/2019 CLINICAL DATA:  Possible foreign body aspiration EXAM: PORTABLE CHEST 1 VIEW COMPARISON:  04/10/2019 FINDINGS: Cardiac shadow is stable. Tracheostomy tube is noted in satisfactory position. Pacing device is again seen and stable. The lungs are well aerated. No focal infiltrate is seen. No radiopaque foreign body is  noted on these films. IMPRESSION: No acute abnormality noted. Electronically Signed   By: Inez Catalina M.D.   On: 04/20/2019 19:38    Review of Systems  Respiratory: Positive for shortness of breath.   All other systems reviewed and are negative.  Blood pressure 97/85, pulse 70, temperature 98.1 F (36.7 C), temperature source Oral, resp. rate 18, height 5\' 3"  (1.6 m), weight 78 kg, SpO2 99 %. Physical Exam  Constitutional: She is oriented to person, place, and time. She appears well-developed and well-nourished. No distress.  HENT:  Head: Normocephalic and atraumatic.  Right Ear: External ear normal.  Left Ear: External ear normal.  Nose: Nose normal.  Mouth/Throat: Oropharynx is clear and moist.  Eyes: Pupils are equal, round, and reactive to light. Conjunctivae  and EOM are normal.  Neck: Normal range of motion. Neck supple.  #6 Shiley trach tube in place with Passy-Muir valve.  Breathing easy.  Mildly hoarse voice.  No stridor.  Cardiovascular: Normal rate.  Respiratory: Effort normal.  Musculoskeletal: Normal range of motion.  Neurological: She is alert and oriented to person, place, and time. No cranial nerve deficit.  Skin: Skin is warm and dry.  Psychiatric: She has a normal mood and affect. Her behavior is normal. Judgment and thought content normal.    Assessment/Plan: Tracheostomy status, shortness of breath  Fiberoptic exam was performed of the trachea and pharyngolarynx at the bedside demonstrating no foreign body, inflammation, or lesion.  See procedure note.  It may be that she had some mucus plugging that has now cleared.  She was reassured.  She appears to be safely at baseline anatomically at this point.  [name] 04/20/2019, 9:10 PM

## 2019-04-20 NOTE — ED Notes (Signed)
Respiratory Therapist at bediside.

## 2019-04-20 NOTE — ED Provider Notes (Signed)
[facility] EMERGENCY DEPARTMENT Provider Note   CSN: [id number] Arrival date & time: 04/20/19  1857    History   Chief Complaint Chief Complaint  Patient presents with  . Shortness of Breath    HPI Hetty Linhart is a 73 y.o. female.     The history is provided by the patient and medical records. No language interpreter was used.  Shortness of Breath   Birdena Kingma is a 73 y.o. female who presents to the Emergency Department complaining of sob. Presents to the emergency department complaining of shortness of breath and difficulty breathing that began just prior to ED arrival. History is limited due to respiratory difficulties. She has a history of tracheostomy and has a 4-0 uncuffed shiley in place. She reports three weeks of shortness of breath and difficulty breathing at times with increased congestion/drainage. Today she was eating chicken when she felt like something became lodged in her throat near her trach. She is tried to suction the trach with no significant improvement in her symptoms. She is on oxygen at home. No prior similar symptoms. She denies any fevers, chest pain.  Additional hx provided by patient's family member, Marta Antu.  Patient was in her routine state of health and eating chicken this afternoon when she developed difficulty breathing and felt like there may be chicken skin caught on her tracheostomy.   Past Medical History:  Diagnosis Date  . Anemia   . Angina   . Angioedema    2/2 ACE  . Arteriovenous malformation of stomach   . Arthritis   . Asthma   . AVM (arteriovenous malformation) of colon    small intestine; stomach  . Blood transfusion   . CHF (congestive heart failure) ([place])   . Complete heart block ([place])    s/p PPM 1998  . Complication of anesthesia    Difficult airway; anaphylaxis and swelling with propofol  . DDD (degenerative disc disease)   . Depression   . Diastolic heart failure   . Fatty liver  07/26/10  . GERD (gastroesophageal reflux disease)   . GI bleed   . Headache   . History of alcohol abuse Stopped Fall 2012  . History of tobacco use Quit Fall 2012  . Hx of cardiovascular stress test    a. Lexiscan Myoview (10/15):  Small inferolateral and apical defect c/w scar and poss soft tissue attenuation, no ischemia, EF 42%  . Hx of colonic polyp 08/13/10   adenomatous  . Hx of colonoscopy   . Hyperlipidemia   . Hyperlipidemia   . Hypertension   . Hypertrophic cardiomyopathy ([place])    dx by Dr [name] 2009  . Iron deficiency anemia   . Myocardial infarction ([place])   . Panic attack   . Panic attacks   . Permanent atrial fibrillation   . Pneumonia   . Renal failure    baseline creatinine 1.6  . Right arm pain 01/08/2012  . RLS (restless legs syndrome)    Dx 06/2007  . Shortness of breath    sob on exertation  . Sleep apnea     Patient Active Problem List   Diagnosis Date Noted  . Other abnormal glucose 11/26/2018  . Polypharmacy 06/25/2018  . Depression with anxiety   . Tracheostomy in place [facility])   . Tracheostomy care ([place])   . Calculus of gallbladder without cholecystitis without obstruction 12/24/2017  . Chronic cough 09/25/2017  . History of colonic polyps   . Moderate protein-calorie malnutrition ([place])  06/02/2017  . Chronic idiopathic constipation 03/19/2017  . 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 Permanent atrial fibrillation   . Laryngeal stenosis 09/18/2016  . Esophageal dysphagia   . Polymyalgia rheumatica ([place]) 04/10/2015  . Hypertrophic cardiomyopathy ([place]) 02/15/2015  . Eczema 09/18/2014  . Environmental allergies 09/18/2014  . Pre-diabetes 11/26/2013  . COPD, moderate ([place]) 11/01/2013  . Complete heart block ([place]) 04/03/2011  . Anemia 02/17/2011  . AVM (arteriovenous malformation) 02/13/2011  . PACEMAKER-[place] 11/28/2010  . CKD (chronic kidney disease) stage 3, GFR 30-59 ml/min ([facility]) 09/24/2010  . Acute on chronic congestive heart failure ([place])  08/30/2010  . ARTHRITIS 07/24/2010  . Generalized anxiety disorder 07/23/2010  . Hyperlipidemia 02/11/2007  . Essential hypertension 02/11/2007  . APNEA, SLEEP 02/11/2007    Past Surgical History:  Procedure Laterality Date  . CARDIAC CATHETERIZATION    . CARDIAC CATHETERIZATION N/A 08/19/2016   Procedure: Right/Left Heart Cath and Coronary Angiography;  Surgeon: Jolaine Artist, MD;  Location: [facility];  Service: Cardiovascular;  Laterality: N/A;  . COLONOSCOPY WITH PROPOFOL N/A 06/16/2017   Procedure: COLONOSCOPY WITH PROPOFOL;  Surgeon: Mauri Pole, MD;  [facility];  Service: Endoscopy;  Laterality: N/A;  . DIRECT LARYNGOSCOPY N/A 09/18/2016   Procedure: DIRECT LARYNGOSCOPY, BRONCHOSCOPY, REMOVAL OF INTUBATION GRANULOMA;  Surgeon: [name], MD;  Location: [facility] OR;  Service: ENT;  Laterality: N/A;  . DIRECT LARYNGOSCOPY N/A 10/20/2016   Procedure: EXTUBATION AND FLEXIBLE LARYNGOSCOPE;  Surgeon: [name], MD;  Location: [place];  Service: ENT;  Laterality: N/A;  . DIRECT LARYNGOSCOPY N/A 10/29/2016   Procedure: DIRECT LARYNGOSCOPY;  Surgeon: [name], MD;  Location: [facility] OR;  Service: ENT;  Laterality: N/A;  . ESOPHAGOGASTRODUODENOSCOPY  12/23/2011   Procedure: ESOPHAGOGASTRODUODENOSCOPY (EGD);  Surgeon: [name], MD;  [facility];  Service: Endoscopy;  Laterality: N/A;  . ESOPHAGOGASTRODUODENOSCOPY (EGD) WITH PROPOFOL N/A 06/16/2017   Procedure: ESOPHAGOGASTRODUODENOSCOPY (EGD) WITH PROPOFOL;  Surgeon: Mauri Pole, MD;  [facility];  Service: Endoscopy;  Laterality: N/A;  . EXTUBATION (ENDOTRACHEAL) IN OR N/A 07/21/2016   Procedure: EXTUBATION (ENDOTRACHEAL) IN OR;  Surgeon: [name], MD;  Location: [place];  Service: ENT;  Laterality: N/A;  . GIVENS CAPSULE STUDY  12/23/2011   Procedure: GIVENS CAPSULE STUDY;  Surgeon: [name], MD;  [facility];  Service: Endoscopy;  Laterality: N/A;  .  MICROLARYNGOSCOPY WITH LASER N/A 10/16/2016   Procedure: MICRODIRECTLARYNGOSCOPY WITH LASER ABLATION AND KENLOG INJECTION;  Surgeon: [name], MD;  Location: [place];  Service: ENT;  Laterality: N/A;  . PACEMAKER INSERTION  1998   st jude, most recent gen change by [name] 4/12  . TRACHEOSTOMY TUBE PLACEMENT N/A 10/29/2016   Procedure: TRACHEOSTOMY;  Surgeon: [name], MD;  Location: [place];  Service: ENT;  Laterality: N/A;  . TUBAL LIGATION  04/01/2000     OB History   No obstetric history on file.      Home Medications    Prior to Admission medications   Medication Sig Start Date End Date Taking? Authorizing Provider  acetaminophen (TYLENOL) 325 MG tablet Take 325-650 mg by mouth every 6 (six) hours as needed for mild pain, fever or headache.     [provider]  albuterol (PROVENTIL HFA;VENTOLIN HFA) 108 (90 Base) MCG/ACT inhaler Inhale 1-2 puffs into the lungs every 6 (six) hours as needed for wheezing or shortness of breath. 08/04/18   Steve Rattler, DO  allopurinol (ZYLOPRIM) 100 MG tablet Take 2  tablets (200 mg total) by mouth daily. 06/03/18   Zenia Resides, MD  ALPRAZolam Duanne Moron) 0.5 MG tablet TAKE 1 TABLET(0.5 MG) BY MOUTH DAILY AS NEEDED FOR ANXIETY 03/24/19   Riccio, Gardiner Rhyme, DO  azithromycin (ZITHROMAX) 250 MG tablet Take 1 tablet (250 mg total) by mouth daily. Take 1 every day until finished. 12/10/18   Mesner, Corene Cornea, MD  benzonatate (TESSALON) 100 MG capsule Take 1 capsule (100 mg total) by mouth 3 (three) times daily as needed for cough. 12/10/18   Mesner, Corene Cornea, MD  cetirizine (ZYRTEC) 10 MG tablet Take 10 mg by mouth daily.    [provider]  ferrous sulfate 325 (65 FE) MG tablet TAKE 1 TABLET BY MOUTH TWICE DAILY WITH MEALS TO KEEP BLOOD COUNT UP Patient taking differently: Take 325 mg by mouth 2 (two) times daily with a meal.  08/26/16   Riccio, Gardiner Rhyme, DO  furosemide (LASIX) 40 MG tablet Take 1 tablet (40 mg total) by mouth 2 (two) times  daily. 06/03/18   Zenia Resides, MD  guaiFENesin 300 MG/15ML SOLN Take 300 mg by mouth 2 (two) times daily. 08/04/18   Steve Rattler, DO  ipratropium-albuterol (DUONEB) 0.5-2.5 (3) MG/3ML SOLN USE 3 ML VIA NEBULIZER EVERY 6 HOURS 10/21/18   Riccio, Gardiner Rhyme, DO  latanoprost (XALATAN) 0.005 % ophthalmic solution Place one drop in both eyes at bedtime. 02/16/18   [provider]  magnesium hydroxide (MILK OF MAGNESIA) 400 MG/5ML suspension Take 60 mLs by mouth daily as needed for mild constipation.     [provider]  metoprolol tartrate (LOPRESSOR) 25 MG tablet TAKE 1 TABLET BY MOUTH EVERY 6 HOURS AS NEEDED FOR HEART FLUTTERING 10/20/18   Allred, Jeneen Rinks, MD  nitroGLYCERIN (NITROSTAT) 0.4 MG SL tablet Place 1 tablet (0.4 mg total) under the tongue every 5 (five) minutes as needed for chest pain. 08/04/18   Steve Rattler, DO  Olopatadine HCl 0.2 % SOLN Place one to two drops in each once daily as needed for itching. 02/17/18   [provider]  Phenylephrine-DM-GG (ROBAFEN CF MULTI-SYMPTOM COLD) 5-10-100 MG/5ML LIQD Take 10 mLs by mouth daily as needed (cold).     [provider]  polyethylene glycol (MIRALAX / GLYCOLAX) packet Take 17 g by mouth daily as needed for mild constipation. 11/26/18   Steve Rattler, DO  potassium chloride (K-DUR) 10 MEQ tablet Take 1 tablet (10 mEq total) by mouth daily. 02/05/18   Steve Rattler, DO  potassium chloride (K-DUR,KLOR-CON) 10 MEQ tablet TAKE 1 TABLET BY MOUTH DAILY 10/21/18   Lucila Maine C, DO  predniSONE (DELTASONE) 1 MG tablet Take 7 mg by mouth daily with breakfast.     [provider]  predniSONE (DELTASONE) 20 MG tablet 2 tabs po daily x 4 days 12/10/18   Mesner, Corene Cornea, MD  ranitidine (ZANTAC) 150 MG tablet Take 1 tablet (150 mg total) by mouth daily. 03/28/18   Muthersbaugh, Jarrett Soho, PA-C  rosuvastatin (CRESTOR) 20 MG tablet Take 1 tablet (20 mg total) by mouth daily. 06/03/18   Zenia Resides, MD  senna  (SENOKOT) 8.6 MG TABS tablet Take 1 tablet by mouth daily as needed for mild constipation.    [provider]  sodium chloride (OCEAN) 0.65 % SOLN nasal spray Place 1 spray into both nostrils daily as needed for congestion.     [provider]  triamcinolone cream (KENALOG) 0.1 % Apply 1 application topically 2 (two) times daily. Patient taking  differently: Apply 1 application topically 2 (two) times daily as needed (rash).  06/03/18   Zenia Resides, MD    Family History Family History  Problem Relation Age of Onset  . Hypertension Mother   . Stroke Mother   . Heart disease Mother   . Aneurysm Mother   . CVA Mother   . Hypertension Father   . Stroke Father   . Heart attack Father   . Alcohol abuse Father   . Hypertension Sister   . Hypertension Brother   . Colon cancer Brother   . Cancer Brother   . Diabetes Sister   . Diabetes Brother   . Heart attack Brother   . Heart attack Brother   . Heart disease Sister   . Alcohol abuse Brother   . Heart disease Daughter     Social History Social History   Tobacco Use  . Smoking status: Former Smoker    Packs/day: 0.10    Years: 50.00    Pack years: 5.00    Types: Cigarettes    Last attempt to quit: 08/16/2011    Years since quitting: 7.6  . Smokeless tobacco: Never Used  . Tobacco comment: quit 2012  Substance Use Topics  . Alcohol use: No    Comment: quit 08/16/2011  . Drug use: No     Allergies   Ace inhibitors and Other   Review of Systems Review of Systems  Respiratory: Positive for shortness of breath.   All other systems reviewed and are negative.    Physical Exam Updated Vital Signs BP 136/83   Pulse 69   Temp 98.1 F (36.7 C) (Oral)   Resp 18   Ht 5\' 3"  (1.6 m)   Wt 78 kg   SpO2 98%   BMI 30.46 kg/m   Physical Exam Vitals signs and nursing note reviewed.  Constitutional:      Appearance: She is well-developed.  HENT:     Head: Normocephalic and atraumatic.      Mouth/Throat:     Mouth: Mucous membranes are moist.     Pharynx: No oropharyngeal exudate or posterior oropharyngeal erythema.  Cardiovascular:     Rate and Rhythm: Normal rate and regular rhythm.     Heart sounds: No murmur.  Pulmonary:     Effort: Pulmonary effort is normal. No respiratory distress.     Breath sounds: Normal breath sounds.     Comments: 4-0 uncuffed shiley in anterior neck.  Mild hoarseness.   Abdominal:     Palpations: Abdomen is soft.     Tenderness: There is no abdominal tenderness. There is no guarding or rebound.  Musculoskeletal:        General: No swelling or tenderness.  Skin:    General: Skin is warm and dry.  Neurological:     Mental Status: She is alert and oriented to person, place, and time.  Psychiatric:        Behavior: Behavior normal.      ED Treatments / Results  Labs (all labs ordered are listed, but only abnormal results are displayed) Labs Reviewed - No data to display  EKG None  Radiology Dg Chest [facility] 1 View  Result Date: 04/20/2019 CLINICAL DATA:  Possible foreign body aspiration EXAM: PORTABLE CHEST 1 VIEW COMPARISON:  04/10/2019 FINDINGS: Cardiac shadow is stable. Tracheostomy tube is noted in satisfactory position. Pacing device is again seen and stable. The lungs are well aerated. No focal infiltrate is seen. No radiopaque foreign body is  noted on these films. IMPRESSION: No acute abnormality noted. Electronically Signed   By: Inez Catalina M.D.   On: 04/20/2019 19:38    Procedures Procedures (including critical care time)  Medications Ordered in ED Medications  albuterol (VENTOLIN HFA) 108 (90 Base) MCG/ACT inhaler 4 puff (4 puffs Inhalation Given 04/20/19 2159)     Initial Impression / Assessment and Plan / ED Course  I have reviewed the triage vital signs and the nursing notes.  Pertinent labs & imaging results that were available during my care of the patient were reviewed by me and considered in my medical decision  making (see chart for details).        Patient with history of tracheostomy here for evaluation of choking and cessation of foreign body in her airway. She is not in respiratory distress on ED arrival. Discussed with Dr. Redmond Baseman with ENT, who evaluated the patient at the bedside and performed bedside scope. There was no evidence of obstruction on his evaluation. She was also provided a dose of albuterol. She is feeling improved on repeat assessment. Discussed with patient home care for choking episode. Discussed the case with patient's son, at the bedside. Plan to discharge home with outpatient follow-up and return precautions.  Final Clinical Impressions(s) / ED Diagnoses   Final diagnoses:  Choking episode    ED Discharge Orders    None       Quintella Reichert, MD 04/20/19 2324

## 2019-04-22 ENCOUNTER — Other Ambulatory Visit: Payer: Self-pay

## 2019-04-22 ENCOUNTER — Emergency Department (HOSPITAL_COMMUNITY): Payer: Medicare Other

## 2019-04-22 ENCOUNTER — Emergency Department (HOSPITAL_COMMUNITY)
Admission: EM | Admit: 2019-04-22 | Discharge: 2019-04-22 | Disposition: A | Discharge status: Home / Self Care | Payer: Medicare Other | Attending: Emergency Medicine | Admitting: Emergency Medicine

## 2019-04-22 DIAGNOSIS — N183 Chronic kidney disease, stage 3 (moderate): Secondary | ICD-10-CM | POA: Insufficient documentation

## 2019-04-22 DIAGNOSIS — M79601 Pain in right arm: Secondary | ICD-10-CM | POA: Insufficient documentation

## 2019-04-22 DIAGNOSIS — R0602 Shortness of breath: Secondary | ICD-10-CM | POA: Diagnosis not present

## 2019-04-22 DIAGNOSIS — R202 Paresthesia of skin: Secondary | ICD-10-CM | POA: Insufficient documentation

## 2019-04-22 DIAGNOSIS — J069 Acute upper respiratory infection, unspecified: Secondary | ICD-10-CM | POA: Diagnosis not present

## 2019-04-22 DIAGNOSIS — I129 Hypertensive chronic kidney disease with stage 1 through stage 4 chronic kidney disease, or unspecified chronic kidney disease: Secondary | ICD-10-CM | POA: Diagnosis not present

## 2019-04-22 DIAGNOSIS — Z20828 Contact with and (suspected) exposure to other viral communicable diseases: Secondary | ICD-10-CM | POA: Insufficient documentation

## 2019-04-22 DIAGNOSIS — M79602 Pain in left arm: Secondary | ICD-10-CM | POA: Insufficient documentation

## 2019-04-22 DIAGNOSIS — Z03818 Encounter for observation for suspected exposure to other biological agents ruled out: Secondary | ICD-10-CM | POA: Diagnosis not present

## 2019-04-22 DIAGNOSIS — Z87891 Personal history of nicotine dependence: Secondary | ICD-10-CM | POA: Diagnosis not present

## 2019-04-22 DIAGNOSIS — Z79899 Other long term (current) drug therapy: Secondary | ICD-10-CM | POA: Diagnosis not present

## 2019-04-22 DIAGNOSIS — Z950 Presence of cardiac pacemaker: Secondary | ICD-10-CM | POA: Insufficient documentation

## 2019-04-22 DIAGNOSIS — J449 Chronic obstructive pulmonary disease, unspecified: Secondary | ICD-10-CM | POA: Diagnosis not present

## 2019-04-22 LAB — CBC WITH DIFFERENTIAL/PLATELET
Abs Immature Granulocytes: 0.03 10*3/uL (ref 0.00–0.07)
Basophils Absolute: 0 10*3/uL (ref 0.0–0.1)
Basophils Relative: 0 %
Eosinophils Absolute: 0 10*3/uL (ref 0.0–0.5)
Eosinophils Relative: 0 %
HCT: 31.5 % — ABNORMAL LOW (ref 36.0–46.0)
Hemoglobin: 9.9 g/dL — ABNORMAL LOW (ref 12.0–15.0)
Immature Granulocytes: 1 %
Lymphocytes Relative: 16 %
Lymphs Abs: 0.9 10*3/uL (ref 0.7–4.0)
MCH: 29.6 pg (ref 26.0–34.0)
MCHC: 31.4 g/dL (ref 30.0–36.0)
MCV: 94.3 fL (ref 80.0–100.0)
Monocytes Absolute: 0.3 10*3/uL (ref 0.1–1.0)
Monocytes Relative: 5 %
Neutro Abs: 4.3 10*3/uL (ref 1.7–7.7)
Neutrophils Relative %: 78 %
Platelets: 242 10*3/uL (ref 150–400)
RBC: 3.34 MIL/uL — ABNORMAL LOW (ref 3.87–5.11)
RDW: 15.7 % — ABNORMAL HIGH (ref 11.5–15.5)
WBC: 5.5 10*3/uL (ref 4.0–10.5)
nRBC: 0 % (ref 0.0–0.2)

## 2019-04-22 LAB — BASIC METABOLIC PANEL
Anion gap: 13 (ref 5–15)
BUN: 37 mg/dL — ABNORMAL HIGH (ref 8–23)
CO2: 24 mmol/L (ref 22–32)
Calcium: 10.2 mg/dL (ref 8.9–10.3)
Chloride: 90 mmol/L — ABNORMAL LOW (ref 98–111)
Creatinine, Ser: 1.58 mg/dL — ABNORMAL HIGH (ref 0.44–1.00)
GFR calc Af Amer: 37 mL/min — ABNORMAL LOW (ref >60)
GFR calc non Af Amer: 32 mL/min — ABNORMAL LOW (ref >60)
Glucose, Bld: 134 mg/dL — ABNORMAL HIGH (ref 70–99)
Potassium: 4.9 mmol/L (ref 3.5–5.1)
Sodium: 127 mmol/L — ABNORMAL LOW (ref 135–145)

## 2019-04-22 LAB — TROPONIN I: Troponin I: 0.12 ng/mL (ref <0.03)

## 2019-04-22 MED ORDER — BENZONATATE 100 MG PO CAPS: 100.0000 mg | ORAL_CAPSULE | Freq: Three times a day (TID) | ORAL | 0 refills | Status: DC

## 2019-04-22 NOTE — ED Provider Notes (Signed)
[place] EMERGENCY DEPARTMENT Provider Note   CSN: [id number] Arrival date & time: 04/22/19  1820    History   Chief Complaint Chief Complaint  Patient presents with  . Chest Pain  . Shortness of Breath    HPI [name] is a 73 y.o. female.     73 yo F with a chief complaint of shortness of breath cough chest pain and now bilateral upper extremity tingling.  This been going on for past week or so.  She started taking Mucinex tablets and then choked on one requiring emergency department visit 2 days ago.  At that time there was some thought of reactive airway disease and she was given an inhaler and started on burst of steroids.  Patient has had no significant improvement.  Continues to have coughing and chest pain.  Describing tingling sensation diffusely about both arms.  Seems to come and go.  Has had that chronically in both her legs as well.  She describes the chest pain is also a tingling sensation.  She denies any productive sputum.  Denies fevers or chills.  The history is provided by the patient.  Illness  Severity:  Mild Onset quality:  Gradual Duration:  2 days Timing:  Constant Progression:  Worsening Chronicity:  New Associated symptoms: chest pain, congestion, cough and shortness of breath   Associated symptoms: no fever, no headaches, no myalgias, no nausea, no rhinorrhea, no vomiting and no wheezing     Past Medical History:  Diagnosis Date  . Anemia   . Angina   . Angioedema    2/2 ACE  . Arteriovenous malformation of stomach   . Arthritis   . Asthma   . AVM (arteriovenous malformation) of colon    small intestine; stomach  . Blood transfusion   . CHF (congestive heart failure) ([place])   . Complete heart block ([place])    s/p PPM 1998  . Complication of anesthesia    Difficult airway; anaphylaxis and swelling with propofol  . DDD (degenerative disc disease)   . Depression   . Diastolic heart failure   . Fatty liver  07/26/10  . GERD (gastroesophageal reflux disease)   . GI bleed   . Headache   . History of alcohol abuse Stopped Fall 2012  . History of tobacco use Quit Fall 2012  . Hx of cardiovascular stress test    a. Lexiscan Myoview (10/15):  Small inferolateral and apical defect c/w scar and poss soft tissue attenuation, no ischemia, EF 42%  . Hx of colonic polyp 08/13/10   adenomatous  . Hx of colonoscopy   . Hyperlipidemia   . Hyperlipidemia   . Hypertension   . Hypertrophic cardiomyopathy ([place])    dx by Dr [name] 2009  . Iron deficiency anemia   . Myocardial infarction ([place])   . Panic attack   . Panic attacks   . Permanent atrial fibrillation   . Pneumonia   . Renal failure    baseline creatinine 1.6  . Right arm pain 01/08/2012  . RLS (restless legs syndrome)    Dx 06/2007  . Shortness of breath    sob on exertation  . Sleep apnea     Patient Active Problem List   Diagnosis Date Noted  . Other abnormal glucose 11/26/2018  . Polypharmacy 06/25/2018  . Depression with anxiety   . Tracheostomy in place [facility])   . Tracheostomy care ([place])   . Calculus of gallbladder without cholecystitis without obstruction 12/24/2017  .  Chronic cough 09/25/2017  . History of colonic polyps   . Moderate protein-calorie malnutrition ([place]) 06/02/2017  . Chronic idiopathic constipation 03/19/2017  . 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 Permanent atrial fibrillation   . Laryngeal stenosis 09/18/2016  . Esophageal dysphagia   . Polymyalgia rheumatica ([place]) 04/10/2015  . Hypertrophic cardiomyopathy ([place]) 02/15/2015  . Eczema 09/18/2014  . Environmental allergies 09/18/2014  . Pre-diabetes 11/26/2013  . COPD, moderate ([place]) 11/01/2013  . Complete heart block ([place]) 04/03/2011  . Anemia 02/17/2011  . AVM (arteriovenous malformation) 02/13/2011  . PACEMAKER-[facility] 11/28/2010  . CKD (chronic kidney disease) stage 3, GFR 30-59 ml/min (HCC) 09/24/2010  . Acute on chronic congestive heart failure ([place])  08/30/2010  . ARTHRITIS 07/24/2010  . Generalized anxiety disorder 07/23/2010  . Hyperlipidemia 02/11/2007  . Essential hypertension 02/11/2007  . APNEA, SLEEP 02/11/2007    Past Surgical History:  Procedure Laterality Date  . CARDIAC CATHETERIZATION    . CARDIAC CATHETERIZATION N/A 08/19/2016   Procedure: Right/Left Heart Cath and Coronary Angiography;  Surgeon: Jolaine Artist, MD;  Location: [facility];  Service: Cardiovascular;  Laterality: N/A;  . COLONOSCOPY WITH PROPOFOL N/A 06/16/2017   Procedure: COLONOSCOPY WITH PROPOFOL;  Surgeon: Mauri Pole, MD;  Location: [facility];  Service: Endoscopy;  Laterality: N/A;  . DIRECT LARYNGOSCOPY N/A 09/18/2016   Procedure: DIRECT LARYNGOSCOPY, BRONCHOSCOPY, REMOVAL OF INTUBATION GRANULOMA;  Surgeon: [name], MD;  Location: [facility] OR;  Service: ENT;  Laterality: N/A;  . DIRECT LARYNGOSCOPY N/A 10/20/2016   Procedure: EXTUBATION AND FLEXIBLE LARYNGOSCOPE;  Surgeon: [name], MD;  Location: [facility];  Service: ENT;  Laterality: N/A;  . DIRECT LARYNGOSCOPY N/A 10/29/2016   Procedure: DIRECT LARYNGOSCOPY;  Surgeon: [name], MD;  Location: [facility] OR;  Service: ENT;  Laterality: N/A;  . ESOPHAGOGASTRODUODENOSCOPY  12/23/2011   Procedure: ESOPHAGOGASTRODUODENOSCOPY (EGD);  Surgeon: [name], MD;  Location: [facility];  Service: Endoscopy;  Laterality: N/A;  . ESOPHAGOGASTRODUODENOSCOPY (EGD) WITH PROPOFOL N/A 06/16/2017   Procedure: ESOPHAGOGASTRODUODENOSCOPY (EGD) WITH PROPOFOL;  Surgeon: Mauri Pole, MD;  [facility]: [facility];  Service: Endoscopy;  Laterality: N/A;  . EXTUBATION (ENDOTRACHEAL) IN OR N/A 07/21/2016   Procedure: EXTUBATION (ENDOTRACHEAL) IN OR;  Surgeon: [name], MD;  Location: [facility];  Service: ENT;  Laterality: N/A;  . GIVENS CAPSULE STUDY  12/23/2011   Procedure: GIVENS CAPSULE STUDY;  Surgeon: [name], MD;  Location: [facility];  Service: Endoscopy;  Laterality: N/A;  .  MICROLARYNGOSCOPY WITH LASER N/A 10/16/2016   Procedure: MICRODIRECTLARYNGOSCOPY WITH LASER ABLATION AND KENLOG INJECTION;  Surgeon: [name], MD;  Location: [place];  Service: ENT;  Laterality: N/A;  . PACEMAKER INSERTION  1998   [facility] jud[facility], most recent gen change by [name] 4/12  . TRACHEOSTOMY TUBE PLACEMENT N/A 10/29/2016   Procedure: TRACHEOSTOMY;  Surgeon: [name], MD;  Location: [place];  Service: ENT;  Laterality: N/A;  . TUBAL LIGATION  04/01/2000     OB History   No obstetric history on file.      Home Medications    Prior to Admission medications   Medication Sig Start Date End Date Taking? Authorizing Provider  acetaminophen (TYLENOL) 325 MG tablet Take 325-650 mg by mouth every 6 (six) hours as needed for mild pain, fever or headache.     [provider]  albuterol (PROVENTIL HFA;VENTOLIN HFA) 108 (90 Base) MCG/ACT inhaler Inhale 1-2 puffs into the lungs every 6 (six) hours as needed for wheezing or shortness of  breath. 08/04/18   Steve Rattler, DO  allopurinol (ZYLOPRIM) 100 MG tablet Take 2 tablets (200 mg total) by mouth daily. 06/03/18   Zenia Resides, MD  ALPRAZolam Duanne Moron) 0.5 MG tablet TAKE 1 TABLET(0.5 MG) BY MOUTH DAILY AS NEEDED FOR ANXIETY 03/24/19   Riccio, Gardiner Rhyme, DO  azithromycin (ZITHROMAX) 250 MG tablet Take 1 tablet (250 mg total) by mouth daily. Take 1 every day until finished. 12/10/18   Mesner, Corene Cornea, MD  benzonatate (TESSALON) 100 MG capsule Take 1 capsule (100 mg total) by mouth 3 (three) times daily as needed for cough. 12/10/18   Mesner, Corene Cornea, MD  benzonatate (TESSALON) 100 MG capsule Take 1 capsule (100 mg total) by mouth every 8 (eight) hours. 04/22/19   Deno Etienne, DO  cetirizine (ZYRTEC) 10 MG tablet Take 10 mg by mouth daily.    [provider]  ferrous sulfate 325 (65 FE) MG tablet TAKE 1 TABLET BY MOUTH TWICE DAILY WITH MEALS TO KEEP BLOOD COUNT UP Patient taking differently: Take 325 mg by mouth 2 (two) times daily with a  meal.  08/26/16   Riccio, Gardiner Rhyme, DO  furosemide (LASIX) 40 MG tablet Take 1 tablet (40 mg total) by mouth 2 (two) times daily. 06/03/18   Zenia Resides, MD  guaiFENesin 300 MG/15ML SOLN Take 300 mg by mouth 2 (two) times daily. 08/04/18   Steve Rattler, DO  ipratropium-albuterol (DUONEB) 0.5-2.5 (3) MG/3ML SOLN USE 3 ML VIA NEBULIZER EVERY 6 HOURS 10/21/18   Riccio, Gardiner Rhyme, DO  latanoprost (XALATAN) 0.005 % ophthalmic solution Place one drop in both eyes at bedtime. 02/16/18   [provider]  magnesium hydroxide (MILK OF MAGNESIA) 400 MG/5ML suspension Take 60 mLs by mouth daily as needed for mild constipation.     [provider]  metoprolol tartrate (LOPRESSOR) 25 MG tablet TAKE 1 TABLET BY MOUTH EVERY 6 HOURS AS NEEDED FOR HEART FLUTTERING 10/20/18   Allred, Jeneen Rinks, MD  nitroGLYCERIN (NITROSTAT) 0.4 MG SL tablet Place 1 tablet (0.4 mg total) under the tongue every 5 (five) minutes as needed for chest pain. 08/04/18   Steve Rattler, DO  Olopatadine HCl 0.2 % SOLN Place one to two drops in each once daily as needed for itching. 02/17/18   [provider]  Phenylephrine-DM-GG (ROBAFEN CF MULTI-SYMPTOM COLD) 5-10-100 MG/5ML LIQD Take 10 mLs by mouth daily as needed (cold).     [provider]  polyethylene glycol (MIRALAX / GLYCOLAX) packet Take 17 g by mouth daily as needed for mild constipation. 11/26/18   Steve Rattler, DO  potassium chloride (K-DUR) 10 MEQ tablet Take 1 tablet (10 mEq total) by mouth daily. 02/05/18   Steve Rattler, DO  potassium chloride (K-DUR) 10 MEQ tablet TAKE 1 TABLET BY MOUTH DAILY 04/21/19   Lucila Maine C, DO  predniSONE (DELTASONE) 1 MG tablet Take 7 mg by mouth daily with breakfast.     [provider]  predniSONE (DELTASONE) 20 MG tablet 2 tabs po daily x 4 days 12/10/18   Mesner, Corene Cornea, MD  ranitidine (ZANTAC) 150 MG tablet Take 1 tablet (150 mg total) by mouth daily. 03/28/18   Muthersbaugh, Jarrett Soho, PA-C   rosuvastatin (CRESTOR) 20 MG tablet Take 1 tablet (20 mg total) by mouth daily. 06/03/18   Zenia Resides, MD  senna (SENOKOT) 8.6 MG TABS tablet Take 1 tablet by mouth daily as needed for mild constipation.    [provider]  sodium chloride (  OCEAN) 0.65 % SOLN nasal spray Place 1 spray into both nostrils daily as needed for congestion.     [provider]  triamcinolone cream (KENALOG) 0.1 % Apply 1 application topically 2 (two) times daily. Patient taking differently: Apply 1 application topically 2 (two) times daily as needed (rash).  06/03/18   Zenia Resides, MD    Family History Family History  Problem Relation Age of Onset  . Hypertension Mother   . Stroke Mother   . Heart disease Mother   . Aneurysm Mother   . CVA Mother   . Hypertension Father   . Stroke Father   . Heart attack Father   . Alcohol abuse Father   . Hypertension Sister   . Hypertension Brother   . Colon cancer Brother   . Cancer Brother   . Diabetes Sister   . Diabetes Brother   . Heart attack Brother   . Heart attack Brother   . Heart disease Sister   . Alcohol abuse Brother   . Heart disease Daughter     Social History Social History   Tobacco Use  . Smoking status: Former Smoker    Packs/day: 0.10    Years: 50.00    Pack years: 5.00    Types: Cigarettes    Last attempt to quit: 08/16/2011    Years since quitting: 7.6  . Smokeless tobacco: Never Used  . Tobacco comment: quit 2012  Substance Use Topics  . Alcohol use: No    Comment: quit 08/16/2011  . Drug use: No     Allergies   Ace inhibitors and Other   Review of Systems Review of Systems  Constitutional: Negative for chills and fever.  HENT: Positive for congestion. Negative for rhinorrhea.   Eyes: Negative for redness and visual disturbance.  Respiratory: Positive for cough and shortness of breath. Negative for wheezing.   Cardiovascular: Positive for chest pain. Negative for palpitations.   Gastrointestinal: Negative for nausea and vomiting.  Genitourinary: Negative for dysuria and urgency.  Musculoskeletal: Negative for arthralgias and myalgias.  Skin: Negative for pallor and wound.  Neurological: Positive for numbness (paresthesias, to bilateral upper extremities. ). Negative for dizziness and headaches.     Physical Exam Updated Vital Signs BP 122/87   Pulse 72   Resp 18   Ht 5\' 3"  (1.6 m)   Wt 78 kg   SpO2 100%   BMI 30.46 kg/m   Physical Exam Vitals signs and nursing note reviewed.  Constitutional:      General: She is not in acute distress.    Appearance: She is well-developed. She is not diaphoretic.  HENT:     Head: Normocephalic and atraumatic.     Comments: Tracheostomy in place Eyes:     Pupils: Pupils are equal, round, and reactive to light.  Neck:     Musculoskeletal: Normal range of motion and neck supple.  Cardiovascular:     Rate and Rhythm: Normal rate and regular rhythm.     Heart sounds: No murmur. No friction rub. No gallop.   Pulmonary:     Effort: Pulmonary effort is normal.     Breath sounds: No wheezing or rales.     Comments: Slightly diminished in all fields Abdominal:     General: There is no distension.     Palpations: Abdomen is soft.     Tenderness: There is no abdominal tenderness. There is no guarding.  Musculoskeletal:        General: No  tenderness.  Skin:    General: Skin is warm and dry.  Neurological:     Mental Status: She is alert and oriented to person, place, and time.  Psychiatric:        Behavior: Behavior normal.      ED Treatments / Results  Labs (all labs ordered are listed, but only abnormal results are displayed) Labs Reviewed  CBC WITH DIFFERENTIAL/PLATELET - Abnormal; Notable for the following components:      Result Value   RBC 3.34 (*)    Hemoglobin 9.9 (*)    HCT 31.5 (*)    RDW 15.7 (*)    All other components within normal limits  BASIC METABOLIC PANEL - Abnormal; Notable for the  following components:   Sodium 127 (*)    Chloride 90 (*)    Glucose, Bld 134 (*)    BUN 37 (*)    Creatinine, Ser 1.58 (*)    GFR calc non Af Amer 32 (*)    GFR calc Af Amer 37 (*)    All other components within normal limits  TROPONIN I - Abnormal; Notable for the following components:   Troponin I 0.12 (*)    All other components within normal limits  NOVEL CORONAVIRUS, NAA (HOSPITAL ORDER, SEND-OUT TO REF LAB)    EKG EKG Interpretation  Date/Time:  Friday Apr 22 2019 18:39:37 EDT Ventricular Rate:  70 PR Interval:    QRS Duration: 215 QT Interval:  499 QTC Calculation: 539 R Axis:   -83 Text Interpretation:  Sinus rhythm Short PR interval IVCD, consider atypical RBBB LVH with IVCD, LAD and secondary repol abnrm Prolonged QT interval No significant change since last tracing Confirmed by Deno Etienne [phone]) on 04/22/2019 6:42:24 PM   Radiology Dg Chest Port 1 View  Result Date: 04/22/2019 CLINICAL DATA:  Shortness of breath over the last 2-3 days. Chest pain. EXAM: PORTABLE CHEST 1 VIEW COMPARISON:  04/20/2019 FINDINGS: Dual lead pacer unchanged.  Tracheostomy tube noted. Atherosclerotic calcification of the aortic arch. Mild enlargement of the cardiopericardial silhouette. Mild upper zone pulmonary vascular prominence without overt interstitial or airspace opacity. IMPRESSION: 1. Mild enlargement of the cardiopericardial silhouette, with upper zone pulmonary vascular prominence suggesting pulmonary venous hypertension, but no overt edema. 2. Dual lead pacer noted. Tracheostomy tube is positioned over the tracheal air column. Electronically Signed   By: Van Clines M.D.   On: 04/22/2019 19:17    Procedures Procedures (including critical care time)  Medications Ordered in ED Medications - No data to display   Initial Impression / Assessment and Plan / ED Course  I have reviewed the triage vital signs and the nursing notes.  Pertinent labs & imaging results that were  available during my care of the patient were reviewed by me and considered in my medical decision making (see chart for details).        73 yo F with a chief complaint of bilateral upper extremity paresthesias.  This been going on for the past few days.  Patient has had a upper respiratory illness she was seen a couple days ago here after she had aspirated on a Mucinex tablet.  She has been crushing them up.  She denies any improvement with her starting steroids and butyryl inhaler at home.  She is clear lung sounds for me though diminished.  Will obtain a chest x-ray EKG and lab work.  Seems unlikely to be ACS as it is completely atypical will obtain one screening troponin.  Troponin is positive but below her typical chest x-ray viewed by me without focal infiltrate or pneumothorax.  Lab work without significant electrolyte abnormality.  We will have her follow-up with her family doctor.  Will swab for covid.   Nike Serena-Polk was evaluated in Emergency Department on 04/22/2019 for the symptoms described in the history of present illness. He/she was evaluated in the context of the global COVID-19 pandemic, which necessitated consideration that the patient might be at risk for infection with the SARS-CoV-2 virus that causes COVID-19. Institutional protocols and algorithms that pertain to the evaluation of patients at risk for COVID-19 are in a state of rapid change based on information released by regulatory bodies including the CDC and federal and state organizations. These policies and algorithms were followed during the patient's care in the ED.   10:34 PM:  I have discussed the diagnosis/risks/treatment options with the patient and believe the pt to be eligible for discharge home to follow-up with PCP. We also discussed returning to the ED immediately if new or worsening sx occur. We discussed the sx which are most concerning (e.g., sudden worsening pain, fever, inability to tolerate by mouth)  that necessitate immediate return. Medications administered to the patient during their visit and any new prescriptions provided to the patient are listed below.  Medications given during this visit Medications - No data to display   The patient appears reasonably screen and/or stabilized for discharge and I doubt any other medical condition or other [facility] requiring further screening, evaluation, or treatment in the ED at this time prior to discharge.      Final Clinical Impressions(s) / ED Diagnoses   Final diagnoses:  URI with cough and congestion  Paresthesia and pain of both upper extremities    ED Discharge Orders         Ordered    benzonatate (TESSALON) 100 MG capsule  Every 8 hours     04/22/19 Benjamin Perez, [place], DO 04/22/19 2234

## 2019-04-22 NOTE — ED Triage Notes (Signed)
Pt presents to the ED with shortness of breath, bilateral arm pain and chest pain. Pt also reports a productive cough but states she has difficulty coughing out the mucus she brings up. Pt has a chronic trach. Pt SpO2 of 100% on room air upon arrival to the ED.

## 2019-04-22 NOTE — Telephone Encounter (Signed)
LMOVM informing tracy of plan.  LMOVM of pt to inform Christen Bame, CMA

## 2019-04-22 NOTE — ED Notes (Signed)
Patient verbalizes understanding of discharge instructions. Opportunity for questioning and answers were provided. Armband removed by staff, pt discharged from ED via wheelchair to home.  

## 2019-04-22 NOTE — Discharge Instructions (Signed)
Follow up with your family doc. Return for worsening symptoms.  °

## 2019-04-23 ENCOUNTER — Telehealth: Payer: Self-pay | Admitting: *Deleted

## 2019-04-23 NOTE — Telephone Encounter (Signed)
CM spoke to patient and she has [facility]. She has RW and bedside commode. Will call [facility] to follow up. Dtr comes to assist her in the home. Son takes her to MD appointments. Jonnie Finner RN CCM Case Mgmt phone [phone]

## 2019-04-24 LAB — NOVEL CORONAVIRUS, NAA (HOSP ORDER, SEND-OUT TO REF LAB; TAT 18-24 HRS): SARS-CoV-2, NAA: NOT DETECTED

## 2019-04-26 DIAGNOSIS — Z930 Tracheostomy status: Secondary | ICD-10-CM | POA: Diagnosis not present

## 2019-04-26 DIAGNOSIS — D649 Anemia, unspecified: Secondary | ICD-10-CM | POA: Diagnosis not present

## 2019-04-26 DIAGNOSIS — I4891 Unspecified atrial fibrillation: Secondary | ICD-10-CM | POA: Diagnosis not present

## 2019-04-26 DIAGNOSIS — M6281 Muscle weakness (generalized): Secondary | ICD-10-CM | POA: Diagnosis not present

## 2019-04-26 DIAGNOSIS — J449 Chronic obstructive pulmonary disease, unspecified: Secondary | ICD-10-CM | POA: Diagnosis not present

## 2019-04-28 DIAGNOSIS — I4891 Unspecified atrial fibrillation: Secondary | ICD-10-CM | POA: Diagnosis not present

## 2019-04-28 DIAGNOSIS — J449 Chronic obstructive pulmonary disease, unspecified: Secondary | ICD-10-CM | POA: Diagnosis not present

## 2019-04-28 DIAGNOSIS — M6281 Muscle weakness (generalized): Secondary | ICD-10-CM | POA: Diagnosis not present

## 2019-04-28 DIAGNOSIS — D649 Anemia, unspecified: Secondary | ICD-10-CM | POA: Diagnosis not present

## 2019-05-02 ENCOUNTER — Other Ambulatory Visit: Payer: Self-pay

## 2019-05-02 NOTE — Patient Outreach (Signed)
[facility]) Care Management  05/02/2019  [name] 1946-10-02 [id number]  TELEPHONE SCREENING Referral date: 04/27/19 Referral source: utilization management Referral reason: Resources for patient Insurance: [employer]   Attempt #1  Telephone call to patients son and designated party release, Database administrator. Unable to reach. HIPAA compliant voice message left with call back phone number.   PLAN: RNCM will attempt 2nd telephone call to patient within 4 business day.  RNCM will send patient outreach letter to attempt contact.   Quinn Plowman RN,BSN,CCM [facility] Telephonic  [phone]

## 2019-05-03 ENCOUNTER — Telehealth: Payer: Self-pay | Admitting: Neurology

## 2019-05-03 DIAGNOSIS — H40023 Open angle with borderline findings, high risk, bilateral: Secondary | ICD-10-CM | POA: Diagnosis not present

## 2019-05-03 DIAGNOSIS — H25813 Combined forms of age-related cataract, bilateral: Secondary | ICD-10-CM | POA: Diagnosis not present

## 2019-05-03 DIAGNOSIS — H40053 Ocular hypertension, bilateral: Secondary | ICD-10-CM | POA: Diagnosis not present

## 2019-05-03 NOTE — Telephone Encounter (Signed)
[name] (Nurse) called regarding patient and her having a hard time expressing her words. She seems confused as well. [name] asked her if she was having a hard time recalling or Expressing. She said they have asked her PCP about a Speech Eval. Please Call [phone]. Thanks

## 2019-05-04 NOTE — Telephone Encounter (Signed)
Called and spoke with [name], RN. We discussed since the PCP had suggested the speech eval, they should really order that. [name] said it was scheduled for this Friday at [facility]. She wanted to confirm the dose of ASA, advised her was 325 mg.

## 2019-05-04 NOTE — Patient Outreach (Signed)
[facility]) Care Management  05/04/2019  [name] 01/03/1946 [id number]   TELEPHONE SCREENING Referral date: 04/27/19 Referral source: utilization management Referral reason: Resources for patient Insurance: [employer] care   Attempt #2  Telephone call to patient regarding utilization management referral. Unable to reach patient. HIPAA compliant voice message left with call back phone number.   PLAN; RNCM will attempt 3rd telephone call to patient within 4 business days.   Quinn Plowman RN,BSN,CCM [facility] Telephonic  [phone]

## 2019-05-05 NOTE — Telephone Encounter (Signed)
This encounter was created in error - please disregard.

## 2019-05-06 ENCOUNTER — Other Ambulatory Visit: Payer: Self-pay

## 2019-05-06 ENCOUNTER — Ambulatory Visit: Payer: Medicare Other

## 2019-05-08 ENCOUNTER — Other Ambulatory Visit: Payer: Self-pay

## 2019-05-08 ENCOUNTER — Emergency Department (HOSPITAL_COMMUNITY): Payer: Medicare Other

## 2019-05-08 ENCOUNTER — Inpatient Hospital Stay (HOSPITAL_COMMUNITY)
Admission: EM | Admit: 2019-05-08 | Discharge: 2019-05-11 | DRG: 065 | Disposition: A | Discharge status: Home Health | Payer: Medicare Other | Attending: Family Medicine | Admitting: Family Medicine

## 2019-05-08 DIAGNOSIS — G4733 Obstructive sleep apnea (adult) (pediatric): Secondary | ICD-10-CM | POA: Diagnosis present

## 2019-05-08 DIAGNOSIS — K219 Gastro-esophageal reflux disease without esophagitis: Secondary | ICD-10-CM | POA: Diagnosis not present

## 2019-05-08 DIAGNOSIS — Z8719 Personal history of other diseases of the digestive system: Secondary | ICD-10-CM | POA: Diagnosis not present

## 2019-05-08 DIAGNOSIS — M353 Polymyalgia rheumatica: Secondary | ICD-10-CM | POA: Diagnosis present

## 2019-05-08 DIAGNOSIS — Z20828 Contact with and (suspected) exposure to other viral communicable diseases: Secondary | ICD-10-CM | POA: Diagnosis present

## 2019-05-08 DIAGNOSIS — N179 Acute kidney failure, unspecified: Secondary | ICD-10-CM | POA: Diagnosis not present

## 2019-05-08 DIAGNOSIS — Z950 Presence of cardiac pacemaker: Secondary | ICD-10-CM

## 2019-05-08 DIAGNOSIS — G8929 Other chronic pain: Secondary | ICD-10-CM | POA: Diagnosis present

## 2019-05-08 DIAGNOSIS — Z9851 Tubal ligation status: Secondary | ICD-10-CM

## 2019-05-08 DIAGNOSIS — Z930 Tracheostomy status: Secondary | ICD-10-CM | POA: Diagnosis not present

## 2019-05-08 DIAGNOSIS — I63412 Cerebral infarction due to embolism of left middle cerebral artery: Secondary | ICD-10-CM | POA: Diagnosis not present

## 2019-05-08 DIAGNOSIS — I130 Hypertensive heart and chronic kidney disease with heart failure and stage 1 through stage 4 chronic kidney disease, or unspecified chronic kidney disease: Secondary | ICD-10-CM | POA: Diagnosis present

## 2019-05-08 DIAGNOSIS — E785 Hyperlipidemia, unspecified: Secondary | ICD-10-CM | POA: Diagnosis not present

## 2019-05-08 DIAGNOSIS — I4821 Permanent atrial fibrillation: Secondary | ICD-10-CM | POA: Diagnosis not present

## 2019-05-08 DIAGNOSIS — Z87892 Personal history of anaphylaxis: Secondary | ICD-10-CM

## 2019-05-08 DIAGNOSIS — K760 Fatty (change of) liver, not elsewhere classified: Secondary | ICD-10-CM | POA: Diagnosis not present

## 2019-05-08 DIAGNOSIS — Z888 Allergy status to other drugs, medicaments and biological substances status: Secondary | ICD-10-CM

## 2019-05-08 DIAGNOSIS — I5032 Chronic diastolic (congestive) heart failure: Secondary | ICD-10-CM | POA: Diagnosis not present

## 2019-05-08 DIAGNOSIS — G2581 Restless legs syndrome: Secondary | ICD-10-CM | POA: Diagnosis present

## 2019-05-08 DIAGNOSIS — J386 Stenosis of larynx: Secondary | ICD-10-CM | POA: Diagnosis not present

## 2019-05-08 DIAGNOSIS — R29818 Other symptoms and signs involving the nervous system: Secondary | ICD-10-CM | POA: Diagnosis not present

## 2019-05-08 DIAGNOSIS — R4701 Aphasia: Secondary | ICD-10-CM | POA: Diagnosis not present

## 2019-05-08 DIAGNOSIS — J449 Chronic obstructive pulmonary disease, unspecified: Secondary | ICD-10-CM | POA: Diagnosis present

## 2019-05-08 DIAGNOSIS — I252 Old myocardial infarction: Secondary | ICD-10-CM

## 2019-05-08 DIAGNOSIS — Z8249 Family history of ischemic heart disease and other diseases of the circulatory system: Secondary | ICD-10-CM

## 2019-05-08 DIAGNOSIS — R079 Chest pain, unspecified: Secondary | ICD-10-CM | POA: Diagnosis not present

## 2019-05-08 DIAGNOSIS — Z6830 Body mass index (BMI) 30.0-30.9, adult: Secondary | ICD-10-CM

## 2019-05-08 DIAGNOSIS — I442 Atrioventricular block, complete: Secondary | ICD-10-CM | POA: Diagnosis not present

## 2019-05-08 DIAGNOSIS — K31819 Angiodysplasia of stomach and duodenum without bleeding: Secondary | ICD-10-CM | POA: Diagnosis present

## 2019-05-08 DIAGNOSIS — Z87898 Personal history of other specified conditions: Secondary | ICD-10-CM

## 2019-05-08 DIAGNOSIS — I422 Other hypertrophic cardiomyopathy: Secondary | ICD-10-CM | POA: Diagnosis present

## 2019-05-08 DIAGNOSIS — E876 Hypokalemia: Secondary | ICD-10-CM | POA: Diagnosis present

## 2019-05-08 DIAGNOSIS — I361 Nonrheumatic tricuspid (valve) insufficiency: Secondary | ICD-10-CM | POA: Diagnosis not present

## 2019-05-08 DIAGNOSIS — Z823 Family history of stroke: Secondary | ICD-10-CM

## 2019-05-08 DIAGNOSIS — R0602 Shortness of breath: Secondary | ICD-10-CM | POA: Diagnosis not present

## 2019-05-08 DIAGNOSIS — I6310 Cerebral infarction due to embolism of unspecified precerebral artery: Secondary | ICD-10-CM | POA: Diagnosis not present

## 2019-05-08 DIAGNOSIS — R0600 Dyspnea, unspecified: Secondary | ICD-10-CM | POA: Diagnosis not present

## 2019-05-08 DIAGNOSIS — N183 Chronic kidney disease, stage 3 (moderate): Secondary | ICD-10-CM | POA: Diagnosis present

## 2019-05-08 DIAGNOSIS — D631 Anemia in chronic kidney disease: Secondary | ICD-10-CM | POA: Diagnosis not present

## 2019-05-08 DIAGNOSIS — M1A9XX0 Chronic gout, unspecified, without tophus (tophi): Secondary | ICD-10-CM | POA: Diagnosis present

## 2019-05-08 DIAGNOSIS — F411 Generalized anxiety disorder: Secondary | ICD-10-CM | POA: Diagnosis present

## 2019-05-08 DIAGNOSIS — R221 Localized swelling, mass and lump, neck: Secondary | ICD-10-CM | POA: Diagnosis not present

## 2019-05-08 DIAGNOSIS — I6349 Cerebral infarction due to embolism of other cerebral artery: Secondary | ICD-10-CM | POA: Diagnosis not present

## 2019-05-08 DIAGNOSIS — R29702 NIHSS score 2: Secondary | ICD-10-CM | POA: Diagnosis not present

## 2019-05-08 DIAGNOSIS — M1990 Unspecified osteoarthritis, unspecified site: Secondary | ICD-10-CM | POA: Diagnosis present

## 2019-05-08 DIAGNOSIS — R471 Dysarthria and anarthria: Secondary | ICD-10-CM | POA: Diagnosis present

## 2019-05-08 DIAGNOSIS — Z811 Family history of alcohol abuse and dependence: Secondary | ICD-10-CM

## 2019-05-08 DIAGNOSIS — Z79899 Other long term (current) drug therapy: Secondary | ICD-10-CM

## 2019-05-08 DIAGNOSIS — Z87891 Personal history of nicotine dependence: Secondary | ICD-10-CM

## 2019-05-08 DIAGNOSIS — I6340 Cerebral infarction due to embolism of unspecified cerebral artery: Secondary | ICD-10-CM

## 2019-05-08 DIAGNOSIS — Z7982 Long term (current) use of aspirin: Secondary | ICD-10-CM

## 2019-05-08 DIAGNOSIS — I480 Paroxysmal atrial fibrillation: Secondary | ICD-10-CM | POA: Diagnosis not present

## 2019-05-08 DIAGNOSIS — K5900 Constipation, unspecified: Secondary | ICD-10-CM | POA: Diagnosis not present

## 2019-05-08 DIAGNOSIS — I4891 Unspecified atrial fibrillation: Secondary | ICD-10-CM | POA: Diagnosis not present

## 2019-05-08 DIAGNOSIS — Z8701 Personal history of pneumonia (recurrent): Secondary | ICD-10-CM

## 2019-05-08 DIAGNOSIS — I340 Nonrheumatic mitral (valve) insufficiency: Secondary | ICD-10-CM | POA: Diagnosis not present

## 2019-05-08 LAB — CBC WITH DIFFERENTIAL/PLATELET
Abs Immature Granulocytes: 0.02 10*3/uL (ref 0.00–0.07)
Basophils Absolute: 0 10*3/uL (ref 0.0–0.1)
Basophils Relative: 1 %
Eosinophils Absolute: 0.1 10*3/uL (ref 0.0–0.5)
Eosinophils Relative: 1 %
HCT: 32.5 % — ABNORMAL LOW (ref 36.0–46.0)
Hemoglobin: 10 g/dL — ABNORMAL LOW (ref 12.0–15.0)
Immature Granulocytes: 0 %
Lymphocytes Relative: 17 %
Lymphs Abs: 1 10*3/uL (ref 0.7–4.0)
MCH: 29.9 pg (ref 26.0–34.0)
MCHC: 30.8 g/dL (ref 30.0–36.0)
MCV: 97 fL (ref 80.0–100.0)
Monocytes Absolute: 0.6 10*3/uL (ref 0.1–1.0)
Monocytes Relative: 9 %
Neutro Abs: 4.4 10*3/uL (ref 1.7–7.7)
Neutrophils Relative %: 72 %
Platelets: 207 10*3/uL (ref 150–400)
RBC: 3.35 MIL/uL — ABNORMAL LOW (ref 3.87–5.11)
RDW: 15.3 % (ref 11.5–15.5)
WBC: 6.1 10*3/uL (ref 4.0–10.5)
nRBC: 0 % (ref 0.0–0.2)

## 2019-05-08 LAB — COMPREHENSIVE METABOLIC PANEL
ALT: 10 U/L (ref 0–44)
AST: 22 U/L (ref 15–41)
Albumin: 3.8 g/dL (ref 3.5–5.0)
Alkaline Phosphatase: 48 U/L (ref 38–126)
Anion gap: 11 (ref 5–15)
BUN: 41 mg/dL — ABNORMAL HIGH (ref 8–23)
CO2: 26 mmol/L (ref 22–32)
Calcium: 9.8 mg/dL (ref 8.9–10.3)
Chloride: 102 mmol/L (ref 98–111)
Creatinine, Ser: 1.94 mg/dL — ABNORMAL HIGH (ref 0.44–1.00)
GFR calc Af Amer: 29 mL/min — ABNORMAL LOW (ref >60)
GFR calc non Af Amer: 25 mL/min — ABNORMAL LOW (ref >60)
Glucose, Bld: 125 mg/dL — ABNORMAL HIGH (ref 70–99)
Potassium: 3.7 mmol/L (ref 3.5–5.1)
Sodium: 139 mmol/L (ref 135–145)
Total Bilirubin: 0.5 mg/dL (ref 0.3–1.2)
Total Protein: 6.1 g/dL — ABNORMAL LOW (ref 6.5–8.1)

## 2019-05-08 LAB — SARS CORONAVIRUS 2 BY RT PCR (HOSPITAL ORDER, PERFORMED IN ~~LOC~~ HOSPITAL LAB): SARS Coronavirus 2: NEGATIVE

## 2019-05-08 LAB — D-DIMER, QUANTITATIVE: D-Dimer, Quant: 0.63 ug/mL-FEU — ABNORMAL HIGH (ref 0.00–0.50)

## 2019-05-08 MED ORDER — ACETAMINOPHEN 160 MG/5ML PO SOLN: 650.0000 mg | ORAL | Status: DC | PRN

## 2019-05-08 MED ORDER — PREDNISONE 5 MG PO TABS
7.0000 mg | ORAL_TABLET | Freq: Every day | ORAL | Status: DC
  Administered 2019-05-09 – 2019-05-11 (×3): 7 mg via ORAL
  Filled 2019-05-08 (×3): qty 2

## 2019-05-08 MED ORDER — FERROUS SULFATE 325 (65 FE) MG PO TABS
325.0000 mg | ORAL_TABLET | Freq: Two times a day (BID) | ORAL | Status: DC
  Administered 2019-05-09 – 2019-05-11 (×5): 325 mg via ORAL
  Filled 2019-05-08 (×5): qty 1

## 2019-05-08 MED ORDER — POLYETHYLENE GLYCOL 3350 17 G PO PACK: 17.0000 g | PACK | Freq: Every day | ORAL | Status: DC | PRN

## 2019-05-08 MED ORDER — ALLOPURINOL 100 MG PO TABS
200.0000 mg | ORAL_TABLET | Freq: Every day | ORAL | Status: DC
  Administered 2019-05-09 – 2019-05-11 (×3): 200 mg via ORAL
  Filled 2019-05-08 (×3): qty 2

## 2019-05-08 MED ORDER — LATANOPROST 0.005 % OP SOLN
1.0000 [drp] | Freq: Every day | OPHTHALMIC | Status: DC
  Administered 2019-05-08 – 2019-05-09 (×2): 1 [drp] via OPHTHALMIC
  Filled 2019-05-08: qty 2.5

## 2019-05-08 MED ORDER — ALPRAZOLAM 0.5 MG PO TABS
0.5000 mg | ORAL_TABLET | Freq: Every day | ORAL | Status: DC | PRN
  Administered 2019-05-09 – 2019-05-10 (×2): 0.5 mg via ORAL
  Filled 2019-05-08 (×2): qty 1

## 2019-05-08 MED ORDER — ACETAMINOPHEN 325 MG PO TABS: 650.0000 mg | ORAL_TABLET | ORAL | Status: DC | PRN

## 2019-05-08 MED ORDER — STROKE: EARLY STAGES OF RECOVERY BOOK
Freq: Once | Status: AC
  Administered 2019-05-08: 23:00:00
  Filled 2019-05-08: qty 1

## 2019-05-08 MED ORDER — ACETAMINOPHEN 650 MG RE SUPP: 650.0000 mg | RECTAL | Status: DC | PRN

## 2019-05-08 NOTE — ED Notes (Addendum)
Report given to 3W RN. All questions answered

## 2019-05-08 NOTE — ED Provider Notes (Signed)
[facility] DE[facility] Provider Note   CSN: [id number] Arrival date & time: 05/08/19  1216    History   Chief Complaint Chief Complaint  Patient presents with  . Shortness of Breath    HPI [name] is a 73 y.o. female.     HPI 73 yo female today complaining of dyspnea.  Patient has trach in place.  She states she has been having increased shortness of breath over the past several weeks.  Discussed with daughter via phone.  She states that the shortness of breath has been increasing over the past week.  She has not noted any fever but has had ongoing coughing.  Has been productive of whitish sputum.  She has not had any known sick contacts or COVID exposure or travel.  She denies any chest pain.  She has had some difficulties with word finding and speech.  This is been being evaluated by neurology.  She had no other focal deficits. Patient has also been complaining of some pressure and fullness in her chin and neck area. Past Medical History:  Diagnosis Date  . Anemia   . Angina   . Angioedema    2/2 ACE  . Arteriovenous malformation of stomach   . Arthritis   . Asthma   . AVM (arteriovenous malformation) of colon    small intestine; stomach  . Blood transfusion   . CHF (congestive heart failure) ([place])   . Complete heart block ([place])    s/p PPM 1998  . Complication of anesthesia    Difficult airway; anaphylaxis and swelling with propofol  . DDD (degenerative disc disease)   . Depression   . Diastolic heart failure   . Fatty liver 07/26/10  . GERD (gastroesophageal reflux disease)   . GI bleed   . Headache   . History of alcohol abuse Stopped Fall 2012  . History of tobacco use Quit Fall 2012  . Hx of cardiovascular stress test    a. Lexiscan Myoview (10/15):  Small inferolateral and apical defect c/w scar and poss soft tissue attenuation, no ischemia, EF 42%  . Hx of colonic polyp 08/13/10   adenomatous  . Hx of colonoscopy    . Hyperlipidemia   . Hyperlipidemia   . Hypertension   . Hypertrophic cardiomyopathy ([place])    dx by Dr [name] 2009  . Iron deficiency anemia   . Myocardial infarction ([place])   . Panic attack   . Panic attacks   . Permanent atrial fibrillation   . Pneumonia   . Renal failure    baseline creatinine 1.6  . Right arm pain 01/08/2012  . RLS (restless legs syndrome)    Dx 06/2007  . Shortness of breath    sob on exertation  . Sleep apnea     Patient Active Problem List   Diagnosis Date Noted  . Other abnormal glucose 11/26/2018  . Polypharmacy 06/25/2018  . Depression with anxiety   . Tracheostomy in place [facility])   . Tracheostomy care ([place])   . Calculus of gallbladder without cholecystitis without obstruction 12/24/2017  . Chronic cough 09/25/2017  . History of colonic polyps   . Moderate protein-calorie malnutrition ([place]) 06/02/2017  . Chronic idiopathic constipation 03/19/2017  . 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 Permanent atrial fibrillation   . Laryngeal stenosis 09/18/2016  . Esophageal dysphagia   . Polymyalgia rheumatica ([place]) 04/10/2015  . Hypertrophic cardiomyopathy ([place]) 02/15/2015  . Eczema 09/18/2014  . Environmental allergies 09/18/2014  . Pre-diabetes  11/26/2013  . COPD, moderate ([place]) 11/01/2013  . Complete heart block ([place]) 04/03/2011  . Anemia 02/17/2011  . AVM (arteriovenous malformation) 02/13/2011  . PACEMAKER-[facility] 11/28/2010  . CKD (chronic kidney disease) stage 3, GFR 30-59 ml/min (H[facility]) 09/24/2010  . Acute on chronic congestive heart failure ([place]) 08/30/2010  . ARTHRITIS 07/24/2010  . Generalized anxiety disorder 07/23/2010  . Hyperlipidemia 02/11/2007  . Essential hypertension 02/11/2007  . APNEA, SLEEP 02/11/2007    Past Surgical History:  Procedure Laterality Date  . CARDIAC CATHETERIZATION    . CARDIAC CATHETERIZATION N/A 08/19/2016   Procedure: Right/Left Heart Cath and Coronary Angiography;  Surgeon: Jolaine Artist, MD;  Location: [facility];  Service: Cardiovascular;  Laterality: N/A;  . COLONOSCOPY WITH PROPOFOL N/A 06/16/2017   Procedure: COLONOSCOPY WITH PROPOFOL;  Surgeon: Mauri Pole, MD;  Location: [facility];  Service: Endoscopy;  Laterality: N/A;  . DIRECT LARYNGOSCOPY N/A 09/18/2016   Procedure: DIRECT LARYNGOSCOPY, BRONCHOSCOPY, REMOVAL OF INTUBATION GRANULOMA;  Surgeon: [name], MD;  Location: [facility] OR;  Service: ENT;  Laterality: N/A;  . DIRECT LARYNGOSCOPY N/A 10/20/2016   Procedure: EXTUBATION AND FLEXIBLE LARYNGOSCOPE;  Surgeon: [name], MD;  Location: [facility];  Service: ENT;  Laterality: N/A;  . DIRECT LARYNGOSCOPY N/A 10/29/2016   Procedure: DIRECT LARYNGOSCOPY;  Surgeon: [name], MD;  Location: [facility] OR;  Service: ENT;  Laterality: N/A;  . ESOPHAGOGASTRODUODENOSCOPY  12/23/2011   Procedure: ESOPHAGOGASTRODUODENOSCOPY (EGD);  Surgeon: [name], MD;  Location: [facility];  Service: Endoscopy;  Laterality: N/A;  . ESOPHAGOGASTRODUODENOSCOPY (EGD) WITH PROPOFOL N/A 06/16/2017   Procedure: ESOPHAGOGASTRODUODENOSCOPY (EGD) WITH PROPOFOL;  Surgeon: Mauri Pole, MD;  Location: [facility];  Service: Endoscopy;  Laterality: N/A;  . EXTUBATION (ENDOTRACHEAL) IN OR N/A 07/21/2016   Procedure: EXTUBATION (ENDOTRACHEAL) IN OR;  Surgeon: [name], MD;  Location: [facility];  Service: ENT;  Laterality: N/A;  . GIVENS CAPSULE STUDY  12/23/2011   Procedure: GIVENS CAPSULE STUDY;  Surgeon: [name], MD;  Location: [facility];  Service: Endoscopy;  Laterality: N/A;  . MICROLARYNGOSCOPY WITH LASER N/A 10/16/2016   Procedure: MICRODIRECTLARYNGOSCOPY WITH LASER ABLATION AND KENLOG INJECTION;  Surgeon: [name], MD;  Location: [facility];  Service: ENT;  Laterality: N/A;  . PACEMAKER INSERTION  1998   [facility], most recent gen change by [name] 4/12  . TRACHEOSTOMY TUBE PLACEMENT N/A 10/29/2016   Procedure: TRACHEOSTOMY;  Surgeon: [name], MD;  Location: [facility];  Service: ENT;  Laterality:  N/A;  . TUBAL LIGATION  04/01/2000     OB History   No obstetric history on file.      Home Medications    Prior to Admission medications   Medication Sig Start Date End Date Taking? Authorizing Provider  acetaminophen (TYLENOL) 325 MG tablet Take 325-650 mg by mouth every 6 (six) hours as needed for mild pain, fever or headache.     [provider]  albuterol (PROVENTIL HFA;VENTOLIN HFA) 108 (90 Base) MCG/ACT inhaler Inhale 1-2 puffs into the lungs every 6 (six) hours as needed for wheezing or shortness of breath. 08/04/18   Riccio, Gardiner Rhyme, DO  allopurinol (ZYLOPRIM) 100 MG tablet Take 2 tablets (200 mg total) by mouth daily. 06/03/18   Zenia Resides, MD  ALPRAZolam Duanne Moron) 0.5 MG tablet TAKE 1 TABLET(0.5 MG) BY MOUTH DAILY AS NEEDED FOR ANXIETY 03/24/19   Riccio, Gardiner Rhyme, DO  azithromycin (ZITHROMAX) 250 MG tablet Take 1 tablet (250 mg total) by mouth  daily. Take 1 every day until finished. 12/10/18   Mesner, Corene Cornea, MD  benzonatate (TESSALON) 100 MG capsule Take 1 capsule (100 mg total) by mouth 3 (three) times daily as needed for cough. 12/10/18   Mesner, Corene Cornea, MD  benzonatate (TESSALON) 100 MG capsule Take 1 capsule (100 mg total) by mouth every 8 (eight) hours. 04/22/19   Deno Etienne, DO  cetirizine (ZYRTEC) 10 MG tablet Take 10 mg by mouth daily.    [provider]  ferrous sulfate 325 (65 FE) MG tablet TAKE 1 TABLET BY MOUTH TWICE DAILY WITH MEALS TO KEEP BLOOD COUNT UP Patient taking differently: Take 325 mg by mouth 2 (two) times daily with a meal.  08/26/16   Riccio, Gardiner Rhyme, DO  furosemide (LASIX) 40 MG tablet Take 1 tablet (40 mg total) by mouth 2 (two) times daily. 06/03/18   Zenia Resides, MD  guaiFENesin 300 MG/15ML SOLN Take 300 mg by mouth 2 (two) times daily. 08/04/18   Steve Rattler, DO  ipratropium-albuterol (DUONEB) 0.5-2.5 (3) MG/3ML SOLN USE 3 ML VIA NEBULIZER EVERY 6 HOURS 10/21/18   Riccio, Gardiner Rhyme, DO  latanoprost (XALATAN) 0.005 %  ophthalmic solution Place one drop in both eyes at bedtime. 02/16/18   [provider]  magnesium hydroxide (MILK OF MAGNESIA) 400 MG/5ML suspension Take 60 mLs by mouth daily as needed for mild constipation.     [provider]  metoprolol tartrate (LOPRESSOR) 25 MG tablet TAKE 1 TABLET BY MOUTH EVERY 6 HOURS AS NEEDED FOR HEART FLUTTERING 10/20/18   Allred, Jeneen Rinks, MD  nitroGLYCERIN (NITROSTAT) 0.4 MG SL tablet Place 1 tablet (0.4 mg total) under the tongue every 5 (five) minutes as needed for chest pain. 08/04/18   Steve Rattler, DO  Olopatadine HCl 0.2 % SOLN Place one to two drops in each once daily as needed for itching. 02/17/18   [provider]  Phenylephrine-DM-GG (ROBAFEN CF MULTI-SYMPTOM COLD) 5-10-100 MG/5ML LIQD Take 10 mLs by mouth daily as needed (cold).     [provider]  polyethylene glycol (MIRALAX / GLYCOLAX) packet Take 17 g by mouth daily as needed for mild constipation. 11/26/18   Steve Rattler, DO  potassium chloride (K-DUR) 10 MEQ tablet Take 1 tablet (10 mEq total) by mouth daily. 02/05/18   Steve Rattler, DO  potassium chloride (K-DUR) 10 MEQ tablet TAKE 1 TABLET BY MOUTH DAILY 04/21/19   Lucila Maine C, DO  predniSONE (DELTASONE) 1 MG tablet Take 7 mg by mouth daily with breakfast.     [provider]  predniSONE (DELTASONE) 20 MG tablet 2 tabs po daily x 4 days 12/10/18   Mesner, Corene Cornea, MD  ranitidine (ZANTAC) 150 MG tablet Take 1 tablet (150 mg total) by mouth daily. 03/28/18   Muthersbaugh, Jarrett Soho, PA-C  rosuvastatin (CRESTOR) 20 MG tablet Take 1 tablet (20 mg total) by mouth daily. 06/03/18   Zenia Resides, MD  senna (SENOKOT) 8.6 MG TABS tablet Take 1 tablet by mouth daily as needed for mild constipation.    [provider]  sodium chloride (OCEAN) 0.65 % SOLN nasal spray Place 1 spray into both nostrils daily as needed for congestion.     [provider]  triamcinolone cream (KENALOG) 0.1 % Apply 1  application topically 2 (two) times daily. Patient taking differently: Apply 1 application topically 2 (two) times daily as needed (rash).  06/03/18   Zenia Resides, MD    Family History Family History  Problem Relation Age of Onset  . Hypertension Mother   . Stroke Mother   . Heart disease Mother   . Aneurysm Mother   . CVA Mother   . Hypertension Father   . Stroke Father   . Heart attack Father   . Alcohol abuse Father   . Hypertension Sister   . Hypertension Brother   . Colon cancer Brother   . Cancer Brother   . Diabetes Sister   . Diabetes Brother   . Heart attack Brother   . Heart attack Brother   . Heart disease Sister   . Alcohol abuse Brother   . Heart disease Daughter     Social History Social History   Tobacco Use  . Smoking status: Former Smoker    Packs/day: 0.10    Years: 50.00    Pack years: 5.00    Types: Cigarettes    Last attempt to quit: 08/16/2011    Years since quitting: 7.7  . Smokeless tobacco: Never Used  . Tobacco comment: quit 2012  Substance Use Topics  . Alcohol use: No    Comment: quit 08/16/2011  . Drug use: No     Allergies   Ace inhibitors and Other   Review of Systems Review of Systems  All other systems reviewed and are negative.    Physical Exam Updated Vital Signs BP 126/70   Pulse 76   Temp 98.6 F (37 C) (Oral)   Resp 20   Ht 1.6 m (5\' 3" )   Wt 78 kg   SpO2 100%   BMI 30.46 kg/m   Physical Exam Vitals signs and nursing note reviewed.  Constitutional:      General: She is not in acute distress.    Appearance: She is well-developed. She is obese. She is not ill-appearing.  HENT:     Head: Normocephalic.     Mouth/Throat:     Mouth: Mucous membranes are moist.  Eyes:     Pupils: Pupils are equal, round, and reactive to light.  Neck:     Musculoskeletal: Normal range of motion and neck supple.     Comments: Trach in place with no surrounding erythema, redness, induration or significant discharge  Cardiovascular:     Rate and Rhythm: Normal rate.     Heart sounds: Normal heart sounds.  Pulmonary:     Effort: Pulmonary effort is normal.     Breath sounds: Normal breath sounds.  Abdominal:     General: Abdomen is flat.     Palpations: Abdomen is soft.  Musculoskeletal: Normal range of motion.     Right lower leg: No edema.     Left lower leg: No edema.  Skin:    General: Skin is warm.     Capillary Refill: Capillary refill takes less than 2 seconds.  Neurological:     General: No focal deficit present.     Mental Status: She is alert and oriented to person, place, and time.     Cranial Nerves: No cranial nerve deficit.     Sensory: Sensory deficit present.     Coordination: Coordination normal.     Deep Tendon Reflexes: Reflexes normal.     Comments: Some aphasia  Psychiatric:        Mood and Affect: Mood normal.        Behavior: Behavior normal.      ED Treatments / Results  Labs (all labs ordered are listed, but only abnormal results are displayed) Labs Reviewed  CBC WITH DIFFERENTIAL/PLATELET - Abnormal; Notable for the following components:      Result Value   RBC 3.35 (*)    Hemoglobin 10.0 (*)    HCT 32.5 (*)    All other components within normal limits  COMPREHENSIVE METABOLIC PANEL - Abnormal; Notable for the following components:   Glucose, Bld 125 (*)    BUN 41 (*)    Creatinine, Ser 1.94 (*)    Total Protein 6.1 (*)    GFR calc non Af Amer 25 (*)    GFR calc Af Amer 29 (*)    All other components within normal limits  SARS CORONAVIRUS 2 (HOSPITAL ORDER, [facility])    EKG EKG Interpretation  Date/Time:  Sunday May 08 2019 12:38:09 EDT Ventricular Rate:  70 PR Interval:    QRS Duration: 53 QT Interval:  295 QTC Calculation: 319 R Axis:   0 Text Interpretation:  Sinus rhythm Atrial premature complexes Biatrial enlargement No significant change since last tracing Confirmed by Deno Etienne [phone]) on 05/08/2019 3:07:51  PM   Radiology Dg Chest Port 1 View  Result Date: 05/08/2019 CLINICAL DATA:  Shortness of breath EXAM: PORTABLE CHEST 1 VIEW COMPARISON:  04/22/2019 FINDINGS: Tracheostomy. No acute abnormality of the lungs. Cardiomegaly with right chest multi lead pacer. IMPRESSION: Tracheostomy without acute abnormality of the lungs in AP portable projection. Cardiomegaly. Electronically Signed   By: Eddie Candle M.D.   On: 05/08/2019 14:33    Procedures Procedures (including critical care time)  Medications Ordered in ED Medications - No data to display   Initial Impression / Assessment and Plan / ED Course  I have reviewed the triage vital signs and the nursing notes.  Pertinent labs & imaging results that were available during my care of the patient were reviewed by me and considered in my medical decision making (see chart for details).      Patient's chief complaint today is dyspnea. Vital signs are stable with normal pulse oximetry Chest x-Dalicia Kisner significant for tracheostomy but is otherwise clear Head CT, neck, and chest CT are pending.  Patient has had recent speech problems and head CT will evaluate for stroke she is also complained of some neck fullness and will evaluate with CT CT chest to evaluate for dyspnea..  Discussed with Dr. Tyrone Nine If above are normal, patient can be considered for discharge home  Final Clinical Impressions(s) / ED Diagnoses   Final diagnoses:  Dyspnea, unspecified type  Aphasia    ED Discharge Orders    None       Pattricia Boss, MD 05/08/19 [id number]

## 2019-05-08 NOTE — ED Notes (Signed)
ED TO INPATIENT HANDOFF REPORT  ED Nurse Name and Phone #: [phone] Lucita Ferrara Name/Age/Gender [name] 73 y.o. female Room/Bed: 034C/034C  Code Status   Code Status: Prior  Home/SNF/Other Home Patient oriented to: self, place, time and situation Is this baseline? Yes   Triage Complete: Triage complete  Chief Complaint sob  Triage Note Pt presents to the ED with shortness of breath that has been going on for a few weeks patient reports that the shortness of breath worsened today and that caused her to come to the ED. Pt also reports intermittent chest pain that she reports has been going on for a few weeks as well. Pt does have some difficulty getting her words out and reports that has been going on for a few weeks and she is following up with speech outpatient for that. Pt is alert and oriented x4 at present time. Pt denies any fevers at home. Pt does have a trach.    Allergies Allergies  Allergen Reactions  . Ace Inhibitors Swelling    angiodema  . Other Anaphylaxis and Swelling    Reaction to unspecified anesthesia     Level of Care/Admitting Diagnosis ED Disposition    ED Disposition Condition [facility] Area: [place] [100100]  Level of Care: Telemetry Medical [104]  Covid Evaluation: N/A  Diagnosis: Expressive aphasia [[id number]]  Admitting Physician: Martyn Malay [[id number]]  Attending Physician: Martyn Malay [[id number]]  Estimated length of stay: past midnight tomorrow  Certification:: I certify this patient will need inpatient services for at least 2 midnights  PT Class (Do Not Modify): Inpatient [101]  PT Acc Code (Do Not Modify): Private [1]       B Medical/Surgery History Past Medical History:  Diagnosis Date  . Anemia   . Angina   . Angioedema    2/2 ACE  . Arteriovenous malformation of stomach   . Arthritis   . Asthma   . AVM (arteriovenous malformation) of colon    small intestine; stomach  .  Blood transfusion   . CHF (congestive heart failure) ([place])   . Complete heart block ([place])    s/p PPM 1998  . Complication of anesthesia    Difficult airway; anaphylaxis and swelling with propofol  . DDD (degenerative disc disease)   . Depression   . Diastolic heart failure   . Fatty liver 07/26/10  . GERD (gastroesophageal reflux disease)   . GI bleed   . Headache   . History of alcohol abuse Stopped Fall 2012  . History of tobacco use Quit Fall 2012  . Hx of cardiovascular stress test    a. Lexiscan Myoview (10/15):  Small inferolateral and apical defect c/w scar and poss soft tissue attenuation, no ischemia, EF 42%  . Hx of colonic polyp 08/13/10   adenomatous  . Hx of colonoscopy   . Hyperlipidemia   . Hyperlipidemia   . Hypertension   . Hypertrophic cardiomyopathy ([place])    dx by Dr [name] 2009  . Iron deficiency anemia   . Myocardial infarction ([place])   . Panic attack   . Panic attacks   . Permanent atrial fibrillation   . Pneumonia   . Renal failure    baseline creatinine 1.6  . Right arm pain 01/08/2012  . RLS (restless legs syndrome)    Dx 06/2007  . Shortness of breath    sob on exertation  . Sleep apnea    Past Surgical  History:  Procedure Laterality Date  . CARDIAC CATHETERIZATION    . CARDIAC CATHETERIZATION N/A 08/19/2016   Procedure: Right/Left Heart Cath and Coronary Angiography;  Surgeon: Jolaine Artist, MD;  Location: [facility];  Service: Cardiovascular;  Laterality: N/A;  . COLONOSCOPY WITH PROPOFOL N/A 06/16/2017   Procedure: COLONOSCOPY WITH PROPOFOL;  Surgeon: Mauri Pole, MD;  Location: [facility];  Service: Endoscopy;  Laterality: N/A;  . DIRECT LARYNGOSCOPY N/A 09/18/2016   Procedure: DIRECT LARYNGOSCOPY, BRONCHOSCOPY, REMOVAL OF INTUBATION GRANULOMA;  Surgeon: [name], MD;  Location: [facility] OR;  Service: ENT;  Laterality: N/A;  . DIRECT LARYNGOSCOPY N/A 10/20/2016   Procedure: EXTUBATION AND FLEXIBLE LARYNGOSCOPE;  Surgeon:  [name], MD;  Location: [facility];  Service: ENT;  Laterality: N/A;  . DIRECT LARYNGOSCOPY N/A 10/29/2016   Procedure: DIRECT LARYNGOSCOPY;  Surgeon: [name], MD;  Location: [facility] OR;  Service: ENT;  Laterality: N/A;  . ESOPHAGOGASTRODUODENOSCOPY  12/23/2011   Procedure: ESOPHAGOGASTRODUODENOSCOPY (EGD);  Surgeon: [name], MD;  Location: [facility];  Service: Endoscopy;  Laterality: N/A;  . ESOPHAGOGASTRODUODENOSCOPY (EGD) WITH PROPOFOL N/A 06/16/2017   Procedure: ESOPHAGOGASTRODUODENOSCOPY (EGD) WITH PROPOFOL;  Surgeon: Mauri Pole, MD;  Location: [facility];  Service: Endoscopy;  Laterality: N/A;  . EXTUBATION (ENDOTRACHEAL) IN OR N/A 07/21/2016   Procedure: EXTUBATION (ENDOTRACHEAL) IN OR;  Surgeon: [name], MD;  Location: [facility];  Service: ENT;  Laterality: N/A;  . GIVENS CAPSULE STUDY  12/23/2011   Procedure: GIVENS CAPSULE STUDY;  Surgeon: [name], MD;  Location: [facility];  Service: Endoscopy;  Laterality: N/A;  . MICROLARYNGOSCOPY WITH LASER N/A 10/16/2016   Procedure: MICRODIRECTLARYNGOSCOPY WITH LASER ABLATION AND KENLOG INJECTION;  Surgeon: [name], MD;  Location: [facility];  Service: ENT;  Laterality: N/A;  . PACEMAKER INSERTION  1998   st jude, most recent gen change by [name] 4/12  . TRACHEOSTOMY TUBE PLACEMENT N/A 10/29/2016   Procedure: TRACHEOSTOMY;  Surgeon: [name], MD;  Location: [facility];  Service: ENT;  Laterality: N/A;  . TUBAL LIGATION  04/01/2000     A IV Location/Drains/Wounds Patient Lines/Drains/Airways Status   Active Line/Drains/Airways    Name:   Placement date:   Placement time:   Site:   Days:   Peripheral IV 05/08/19 Right Antecubital   05/08/19    1419    Antecubital   less than 1   Incision (Closed) 09/18/16 N/A   09/18/16    0749     962   Incision (Closed) 10/29/16 Neck Other (Comment)   10/29/16    1441     921   Tracheostomy [place] 4 mm Uncuffed   05/22/18    -    4 mm   351          Intake/Output Last 24 hours No  intake or output data in the 24 hours ending 05/08/19 1954  Labs/Imaging Results for orders placed or performed during the hospital encounter of 05/08/19 (from the past 48 hour(s))  SARS Coronavirus 2 (CEPHEID - Performed in [facility] hospital lab), Hosp Order     Status: None   Collection Time: 05/08/19  1:48 PM  Result Value Ref Range   SARS Coronavirus 2 NEGATIVE NEGATIVE    Comment: (NOTE) If result is NEGATIVE SARS-CoV-2 target nucleic acids are NOT DETECTED. The SARS-CoV-2 RNA is generally detectable in upper and lower  respiratory specimens during the acute phase of infection. The lowest  concentration of SARS-CoV-2 viral copies this  assay can detect is 250  copies / mL. A negative result does not preclude SARS-CoV-2 infection  and should not be used as the sole basis for treatment or other  patient management decisions.  A negative result may occur with  improper specimen collection / handling, submission of specimen other  than nasopharyngeal swab, presence of viral mutation(s) within the  areas targeted by this assay, and inadequate number of viral copies  (<250 copies / mL). A negative result must be combined with clinical  observations, patient history, and epidemiological information. If result is POSITIVE SARS-CoV-2 target nucleic acids are DETECTED. The SARS-CoV-2 RNA is generally detectable in upper and lower  respiratory specimens dur ing the acute phase of infection.  Positive  results are indicative of active infection with SARS-CoV-2.  Clinical  correlation with patient history and other diagnostic information is  necessary to determine patient infection status.  Positive results do  not rule out bacterial infection or co-infection with other viruses. If result is PRESUMPTIVE POSTIVE SARS-CoV-2 nucleic acids MAY BE PRESENT.   A presumptive positive result was obtained on the submitted specimen  and confirmed on repeat testing.  While 2019 novel coronavirus   (SARS-CoV-2) nucleic acids may be present in the submitted sample  additional confirmatory testing may be necessary for epidemiological  and / or clinical management purposes  to differentiate between  SARS-CoV-2 and other Sarbecovirus currently known to infect humans.  If clinically indicated additional testing with an alternate test  methodology [phone]) is advised. The SARS-CoV-2 RNA is generally  detectable in upper and lower respiratory sp ecimens during the acute  phase of infection. The expected result is Negative. Fact Sheet for Patients:  StrictlyIdeas.no Fact Sheet for Healthcare Providers: BankingDealers.co.za This test is not yet approved or cleared by the [place] FDA and has been authorized for detection and/or diagnosis of SARS-CoV-2 by FDA under an Emergency Use Authorization (EUA).  This EUA will remain in effect (meaning this test can be used) for the duration of the COVID-19 declaration under Section 564(b)(1) of the Act, 21 U.S.C. section 360bbb-3(b)(1), unless the authorization is terminated or revoked sooner. Performed at [facility], [place] [address], [place], [place] [address]   CBC with Differential/Platelet     Status: Abnormal   Collection Time: 05/08/19  2:24 PM  Result Value Ref Range   WBC 6.1 4.0 - 10.5 K/uL   RBC 3.35 (L) 3.87 - 5.11 MIL/uL   Hemoglobin 10.0 (L) 12.0 - 15.0 g/dL   HCT 32.5 (L) 36.0 - 46.0 %   MCV 97.0 80.0 - 100.0 fL   MCH 29.9 26.0 - 34.0 pg   MCHC 30.8 30.0 - 36.0 g/dL   RDW 15.3 11.5 - 15.5 %   Platelets 207 150 - 400 K/uL   nRBC 0.0 0.0 - 0.2 %   Neutrophils Relative % 72 %   Neutro Abs 4.4 1.7 - 7.7 K/uL   Lymphocytes Relative 17 %   Lymphs Abs 1.0 0.7 - 4.0 K/uL   Monocytes Relative 9 %   Monocytes Absolute 0.6 0.1 - 1.0 K/uL   Eosinophils Relative 1 %   Eosinophils Absolute 0.1 0.0 - 0.5 K/uL   Basophils Relative 1 %   Basophils Absolute 0.0 0.0 - 0.1 K/uL    Immature Granulocytes 0 %   Abs Immature Granulocytes 0.02 0.00 - 0.07 K/uL    Comment: Performed at [facility], [address], [place], [place] [address]  Comprehensive metabolic  panel     Status: Abnormal   Collection Time: 05/08/19  2:24 PM  Result Value Ref Range   Sodium 139 135 - 145 mmol/L   Potassium 3.7 3.5 - 5.1 mmol/L   Chloride 102 98 - 111 mmol/L   CO2 26 22 - 32 mmol/L   Glucose, Bld 125 (H) 70 - 99 mg/dL   BUN 41 (H) 8 - 23 mg/dL   Creatinine, Ser 1.94 (H) 0.44 - 1.00 mg/dL   Calcium 9.8 8.9 - 10.3 mg/dL   Total Protein 6.1 (L) 6.5 - 8.1 g/dL   Albumin 3.8 3.5 - 5.0 g/dL   AST 22 15 - 41 U/L   ALT 10 0 - 44 U/L   Alkaline Phosphatase 48 38 - 126 U/L   Total Bilirubin 0.5 0.3 - 1.2 mg/dL   GFR calc non Af Amer 25 (L) >60 mL/min   GFR calc Af Amer 29 (L) >60 mL/min   Anion gap 11 5 - 15    Comment: Performed at [facility], [address], [place], [place] [address]  D-dimer, quantitative (not at [facility])     Status: Abnormal   Collection Time: 05/08/19  6:12 PM  Result Value Ref Range   D-Dimer, Quant 0.63 (H) 0.00 - 0.50 ug/mL-FEU    Comment: (NOTE) At the manufacturer cut-off of 0.50 ug/mL FEU, this assay has been documented to exclude PE with a sensitivity and negative predictive value of 97 to 99%.  At this time, this assay has not been approved by the FDA to exclude DVT/VTE. Results should be correlated with clinical presentation. Performed at [facility], [place] [address], [place], [place] [address]    *Note: Due to a large number of results and/or encounters for the requested time period, some results have not been displayed. A complete set of results can be found in Results Review.   Ct Head Wo Contrast  Result Date: 05/08/2019 CLINICAL DATA:  Shortness of breath, neuro deficit. Stroke suspected. EXAM: CT HEAD WITHOUT CONTRAST TECHNIQUE: Contiguous axial images were obtained from the base of the skull through the vertex without  intravenous contrast. COMPARISON:  Head CT dated 03/17/2019. FINDINGS: Brain: Ventricles are normal in size and configuration. Subtle new low-density focus within the lower LEFT parietal lobe white matter. No parenchymal or extra-axial hemorrhage. Vascular: Chronic calcified atherosclerotic changes of the large vessels at the skull base. No unexpected hyperdense vessel. Skull: Normal. Negative for fracture or focal lesion. Sinuses/Orbits: Chronic bilateral maxillary sinus disease. Periorbital and retro-orbital soft tissues are unremarkable. Other: None. IMPRESSION: 1. Subtle new low-density area within the lower LEFT parietal lobe white matter, suspicious for acute to subacute infarct. Consider MRI for confirmation. 2. No evidence of parenchymal or extra-axial hemorrhage. 3. Chronic bilateral maxillary sinus disease. These results were called by telephone at the time of interpretation on 05/08/2019 at 4:34 pm to the emergency room physician. Electronically Signed   By: Franki Cabot M.D.   On: 05/08/2019 16:35   Ct Soft Tissue Neck Wo Contrast  Result Date: 05/08/2019 CLINICAL DATA:  Shortness of breath for several weeks. Tracheostomy in place. Neck fullness. EXAM: CT NECK WITHOUT CONTRAST TECHNIQUE: Multidetector CT imaging of the neck was performed following the standard protocol without intravenous contrast. COMPARISON:  Head and neck CTA 03/17/2019 FINDINGS: Pharynx and larynx: No evidence of pharyngeal mass or swelling on this unenhanced study. Patent airway. Tracheostomy tube remains in place and appears well seated. Salivary glands: No inflammation, mass, or  stone. Thyroid: Unremarkable. Lymph nodes: No enlarged or suspicious lymph nodes in the neck. Vascular: Limited assessment in the absence of IV contrast. Retropharyngeal course of the distal common and proximal internal carotid arteries bilaterally. Limited intracranial: More fully evaluated on separate head CT. Visualized orbits: Unremarkable.  Mastoids and visualized paranasal sinuses: Near, minimally improved complete opacification of the right maxillary sinus from prior. Similar appearance of left maxillary sinus polypoid mucosal thickening. Small left mastoid effusion. Skeleton: Scattered mild periapical dental lucencies. Severe disc space narrowing at C5-6. Upper chest: Reported separately. Other: None. IMPRESSION: No acute abnormality or mass identified in the neck. Electronically Signed   By: Logan Bores M.D.   On: 05/08/2019 17:15   Ct Chest Wo Contrast  Result Date: 05/08/2019 CLINICAL DATA:  Shortness of breath for several weeks, worsening today. Intermittent chest pain. Tracheostomy patient. EXAM: CT CHEST WITHOUT CONTRAST TECHNIQUE: Multidetector CT imaging of the chest was performed following the standard protocol without IV contrast. COMPARISON:  Radiographs 05/08/2019.  Abdominal CT 07/19/2017. FINDINGS: Cardiovascular: Atherosclerosis of the aorta and great vessels. Right subclavian pacemaker leads extend into the right atrium and right ventricle. There is mild cardiomegaly with a small pericardial effusion versus mild pericardial thickening. Mediastinum/Nodes: There are no enlarged mediastinal, hilar or axillary lymph nodes.Hilar assessment is limited by the lack of intravenous contrast. The patient has a tracheostomy which appears well positioned. The trachea and esophagus appear unremarkable. Lungs/Pleura: There is no pleural effusion or pneumothorax. Mild atelectasis is present at both lung bases. The lungs are otherwise clear. Upper abdomen: No acute findings are seen within the visualized upper abdomen. There are small calcified gallstones. There are bilateral renal cysts. Musculoskeletal/Chest wall: There is no chest wall mass or suspicious osseous finding. IMPRESSION: 1. No acute findings or explanation for the patient's symptoms. 2. Cardiomegaly and Aortic Atherosclerosis (ICD10-I70.0). 3. Neck findings dictated separately.  Electronically Signed   By: Richardean Sale M.D.   On: 05/08/2019 16:39   Dg Chest Port 1 View  Result Date: 05/08/2019 CLINICAL DATA:  Shortness of breath EXAM: PORTABLE CHEST 1 VIEW COMPARISON:  04/22/2019 FINDINGS: Tracheostomy. No acute abnormality of the lungs. Cardiomegaly with right chest multi lead pacer. IMPRESSION: Tracheostomy without acute abnormality of the lungs in AP portable projection. Cardiomegaly. Electronically Signed   By: Eddie Candle M.D.   On: 05/08/2019 14:33    Pending Labs Unresulted Labs (From admission, onward)   None      Vitals/Pain Today's Vitals   05/08/19 1645 05/08/19 1700 05/08/19 1900 05/08/19 1948  BP: 124/70 123/70  (!) 122/93  Pulse: 70 71 70 69  Resp: 19 20 18 14   Temp:      TempSrc:      SpO2: 98% 95% 99% 99%  Weight:      Height:      PainSc:        Isolation Precautions No active isolations  Medications Medications - No data to display  Mobility walks with person assist Low fall risk   Focused Assessments Pulmonary Assessment Handoff:  Lung sounds: L Breath Sounds: Clear R Breath Sounds: Clear O2 Device: Room Air        R Recommendations: See Admitting Provider Note  Report given to:   Additional Notes: N/A

## 2019-05-08 NOTE — Progress Notes (Signed)
Family Medicine Teaching Service Daily Progress Note Intern Pager: [phone]  Patient name: [name] Medical record number: [id number] Date of birth: [birth year] Age: 73 y.o. Gender: female  Primary Care Provider: Steve Rattler, DO Consultants: neurology Code Status: full  Pt Overview and Major Events to Date:  5/24 admitted to FPTS  Assessment and Plan: [name] is a 73 y.o. female presenting with dyspnea and worsening expressive aphasia. PMH is significant for complete heart block and a fib s/p PPM 1998, COPD, polymyalgia rheumatica, HLD, HTN, Gout, GERD, CHF, CKD III, Anemia with AVMs of stomach, sleep apnea, tracheostomy, remote hx alcohol abuse, and GAD.    Expressive Aphasia  First noted 3/27 per chart review.  Seen by neurologist, Dr, [name], on 4/2, who performed CTA head and neck that showed no significant intracranial or extracranial arterial stenosis.  Told to continue ASA 325mg  QD and continue Crestor 20mg  QD with LDL goal <70.  CT head on admission with new low-density area within lower left parietal lobe suspicious for acute to subacute infarct.  Patient this AM continues to be neurologically intact with expressive aphasia. LDL 56 A1c pending. - neuro following; appreciate recs - cont crestor 20mg  QD - consider discussion with GI regarding tx AVMs - monitor on telemetry - echo - allowed normotension - PT/OT/SLP - cont home ASA 325mg  QD - SCDs for DVT PPx given high risk of bleed  Shortness of Breath Likely 2/2 trach mucus.  Stable chronic chest pain.  CT chest wnl, no pneumonia on pulm edea on CXR. Trach not suctioned overnight.  Patient continues to feel mucus in the area that needs suctioned.  Nurse made aware. - trach care per RT - monitor resp status - could consider V/Q scan if SOB does not improve with suctioning of trach  Hypokalemia K 3.3 this AM. -Check Mag - K-Dur 80mEq x1 - f/u AM BMP  Tracheostomy Secondary to OSA and  laryngeal stenosis, seen by ENT at [facility], most recently 04/18/2019.  Clean and in place on exam. - trach care per RT  Atrial Fibrillation and complete heart block s/p PPM 1998 Follows with cardiology, not on anticoagulation given anemia and prior GI bleed 2/2 gastric AVM.  CHADS2-VASC 5, HAS-BLED 3, high risk of major bleed.  Last pacer check on 09/2018. WNL. EKG on admission with pacing spikes, no clear P waves, no changes from previous EKGs.  On metoprolol q6h prn at home.  - cardiac telemetry - holding metoprolol pending med rec as is prn  HFpEF Last Echo 05/2018 with EF 65-70%, mild concentric hypertrophy, G2DD. On Lasix 40mg  BID and metoprolol 25mg  q6h prn palpitations.  Does not appear fluid overloaded on exam.  CXR without acute changes. Held lasix dose last PM given increase in Cr, back to baseline this AM. - cont Lasix 40mg  BID - daily weights - strict I/O  Acute on CKD Stage III: Resolved GFR 29 on admission, stage IV range.  Cr 1.94 on admission, baseline appears ~1.7. Today 1.51.  Euvolemic on exam.  No ACE/ARB.  Does take lasix 40mg  BID.   - monitor Cr - consider nephro consult if no improvement  Polymyalgia rheumatica: Chronic, stable On prednisone taper at home, but unclear dosing.  Per chart review, patient sees Leafy Kindle, [place] at [facility].  No dose found in chart recently.  Note from 11/26/2018 stated patient was on 7mg  QD at that time.  Also notes stating that she ws on taper.   - will need to  vierfy dosing with rheumatologist - would opt to continue pred at 7mg  as do not want to give lower dose than she is on and this is last confirmed dose in chart  HTN: Chronic, stable BP 138/81 on admission.  Takes metoprolol prn at home.  Pending med rec. -  Hold prn metop   HLD On Crestor 20mg  QD at home.  Last lipid panel 05/2018 with LDL 106, goal LDL <70.   - cont statin per above  Gout: Chronic, stable Takes allopurinol daily.  No acute gouty  flare. - cont allopurinol  Anemia of Chronic Disease: Stable Also has history of Gastric AVMs noted in chart.  Receives fereheme from Dr. Carolin Sicks, most recently 5/5, also appears to be scheduled for injection 5/26. Hgb 9.7 today.  Baseline per chart review 9-10. - due for feraheme on 5/26 - monitor CBC  GAD Xanax 0.5 mg prn qd. Has been taking more recently, up to BID. - cont xanax qd prn - consider avoiding on discharge given age  FEN/GI: NPO pending swallow eval Prophylaxis: SCDs given high bleed risk  Disposition: pending PT/OT recs and completion of stroke work-up  Subjective:  Patient continues to endorse neededing trach suctioned and having difficulty finding words. No other complaints or acute events overnight.  Objective: Temp:  [98.5 F (36.9 C)-98.7 F (37.1 C)] 98.7 F (37.1 C) (05/25 0120) Pulse Rate:  [69-76] 70 (05/25 0120) Resp:  [14-24] 18 (05/25 0120) BP: (105-138)/(59-93) 109/60 (05/25 0120) SpO2:  [93 %-100 %] 100 % (05/25 0120) Weight:  [78 kg] 78 kg (05/24 1237)  Physical Exam:  General: 73 y.o. female in NAD Cardio: RRR HEENT: trach in place Lungs: CTAB, no wheezing, no rhonchi, no crackles, no IWOB on RA Abdomen: Soft, non-tender to palpation, non-distended, positive bowel sounds Skin: warm and dry Extremities: No edema   Laboratory: Recent Labs  Lab 05/08/19 1424  WBC 6.1  HGB 10.0*  HCT 32.5*  PLT 207   Recent Labs  Lab 05/08/19 1424  NA 139  K 3.7  CL 102  CO2 26  BUN 41*  CREATININE 1.94*  CALCIUM 9.8  PROT 6.1*  BILITOT 0.5  ALKPHOS 48  ALT 10  AST 22  GLUCOSE 125*    Imaging/Diagnostic Tests: Ct Head Wo Contrast  Result Date: 05/08/2019 CLINICAL DATA:  Shortness of breath, neuro deficit. Stroke suspected. EXAM: CT HEAD WITHOUT CONTRAST TECHNIQUE: Contiguous axial images were obtained from the base of the skull through the vertex without intravenous contrast. COMPARISON:  Head CT dated 03/17/2019. FINDINGS: Brain:  Ventricles are normal in size and configuration. Subtle new low-density focus within the lower LEFT parietal lobe white matter. No parenchymal or extra-axial hemorrhage. Vascular: Chronic calcified atherosclerotic changes of the large vessels at the skull base. No unexpected hyperdense vessel. Skull: Normal. Negative for fracture or focal lesion. Sinuses/Orbits: Chronic bilateral maxillary sinus disease. Periorbital and retro-orbital soft tissues are unremarkable. Other: None. IMPRESSION: 1. Subtle new low-density area within the lower LEFT parietal lobe white matter, suspicious for acute to subacute infarct. Consider MRI for confirmation. 2. No evidence of parenchymal or extra-axial hemorrhage. 3. Chronic bilateral maxillary sinus disease. These results were called by telephone at the time of interpretation on 05/08/2019 at 4:34 pm to the emergency room physician. Electronically Signed   By: Franki Cabot M.D.   On: 05/08/2019 16:35   Ct Soft Tissue Neck Wo Contrast  Result Date: 05/08/2019 CLINICAL DATA:  Shortness of breath for several weeks. Tracheostomy in place. Neck  fullness. EXAM: CT NECK WITHOUT CONTRAST TECHNIQUE: Multidetector CT imaging of the neck was performed following the standard protocol without intravenous contrast. COMPARISON:  Head and neck CTA 03/17/2019 FINDINGS: Pharynx and larynx: No evidence of pharyngeal mass or swelling on this unenhanced study. Patent airway. Tracheostomy tube remains in place and appears well seated. Salivary glands: No inflammation, mass, or stone. Thyroid: Unremarkable. Lymph nodes: No enlarged or suspicious lymph nodes in the neck. Vascular: Limited assessment in the absence of IV contrast. Retropharyngeal course of the distal common and proximal internal carotid arteries bilaterally. Limited intracranial: More fully evaluated on separate head CT. Visualized orbits: Unremarkable. Mastoids and visualized paranasal sinuses: Near, minimally improved complete  opacification of the right maxillary sinus from prior. Similar appearance of left maxillary sinus polypoid mucosal thickening. Small left mastoid effusion. Skeleton: Scattered mild periapical dental lucencies. Severe disc space narrowing at C5-6. Upper chest: Reported separately. Other: None. IMPRESSION: No acute abnormality or mass identified in the neck. Electronically Signed   By: Logan Bores M.D.   On: 05/08/2019 17:15   Ct Chest Wo Contrast  Result Date: 05/08/2019 CLINICAL DATA:  Shortness of breath for several weeks, worsening today. Intermittent chest pain. Tracheostomy patient. EXAM: CT CHEST WITHOUT CONTRAST TECHNIQUE: Multidetector CT imaging of the chest was performed following the standard protocol without IV contrast. COMPARISON:  Radiographs 05/08/2019.  Abdominal CT 07/19/2017. FINDINGS: Cardiovascular: Atherosclerosis of the aorta and great vessels. Right subclavian pacemaker leads extend into the right atrium and right ventricle. There is mild cardiomegaly with a small pericardial effusion versus mild pericardial thickening. Mediastinum/Nodes: There are no enlarged mediastinal, hilar or axillary lymph nodes.Hilar assessment is limited by the lack of intravenous contrast. The patient has a tracheostomy which appears well positioned. The trachea and esophagus appear unremarkable. Lungs/Pleura: There is no pleural effusion or pneumothorax. Mild atelectasis is present at both lung bases. The lungs are otherwise clear. Upper abdomen: No acute findings are seen within the visualized upper abdomen. There are small calcified gallstones. There are bilateral renal cysts. Musculoskeletal/Chest wall: There is no chest wall mass or suspicious osseous finding. IMPRESSION: 1. No acute findings or explanation for the patient's symptoms. 2. Cardiomegaly and Aortic Atherosclerosis (ICD10-I70.0). 3. Neck findings dictated separately. Electronically Signed   By: Richardean Sale M.D.   On: 05/08/2019 16:39   Dg  Chest Port 1 View  Result Date: 05/08/2019 CLINICAL DATA:  Shortness of breath EXAM: PORTABLE CHEST 1 VIEW COMPARISON:  04/22/2019 FINDINGS: Tracheostomy. No acute abnormality of the lungs. Cardiomegaly with right chest multi lead pacer. IMPRESSION: Tracheostomy without acute abnormality of the lungs in AP portable projection. Cardiomegaly. Electronically Signed   By: Eddie Candle M.D.   On: 05/08/2019 14:33    Zyir Gassert, Bernita Raisin, DO 05/09/2019, 2:16 AM PGY-1, Agency Intern pager: [phone], text pages welcome

## 2019-05-08 NOTE — H&P (Addendum)
[facility] Admission History and Physical Service Pager: [phone]  Patient name: [name] Medical record number: [id number] Date of birth: [birth year] Age: 73 y.o. Gender: female  Primary Care Provider: Steve Rattler, DO Consultants: Neurology Code Status: FULL (confirmed on admission)   Preferred Emergency Contact: Daughter, [name] [phone]  Chief Complaint: Dyspnea and difficulty finding words  Assessment and Plan: [name] is a 73 y.o. female presenting with dyspnea and worsening expressive aphasia. PMH is significant for complete heart block and a fib s/p PPM 1998, COPD, polymyalgia rheumatica, HLD, HTN, Gout, GERD, CHF, CKD III, Anemia with AVMs of stomach, sleep apnea, tracheostomy, remote hx alcohol abuse, and GAD.    Expressive Aphasia  First noted 3/27 per chart review.  Seen by neurologist, Dr, [name], on 4/2, who performed CTA head and neck that showed no significant intracranial or extracranial arterial stenosis.  Told to continue ASA 325mg  QD and continue Crestor 20mg  QD with LDL goal <70.  CT head today with new low-density area within lower left parietal lobe suspicious for acute to subacute infarct.  Patient has pacemaker and therefore MRI not ordered.  Neurology consulted by ED, but has not seen yet.  Patient with expressive aphasia on exam, otherwise neurologically intact.  Does express understanding of what is said to her.  CVA is leading differential, no LOC, no seizure-like activity.  No bleed noted on CT scan.  Risk factors for stroke include HLD, HTN, OSA, A Fib not on anticoagulation.  Will admit for stroke work up. - admit as tele, attending Dr. Owens Shark - vitals per floor protocol - neuro following; appreciate recs - monitor on telemetry - echo in AM - permissive hypertension x24 hours - AM BMP, CBC - risk strat labs: hgb A1C, lipid - NPO pending swallow eval - PT/OT/SLP - cont crestor 20 pending neuro  recs - cont home ASA 325mg  QD - SCDs for DVT PPx given high risk of bleed  Shortness of Breath Notes some difficulty breathing 2/2 trach mucus.  Denies dyspnea on exertion.  Stable chronic chest pain.  CTA could not be performed in ED given GFR <30. CT chest wnl. Low suspicison for PE as no change in chest pain, no tachycardia.  Patient notes not acute in onset, has been occurring over the past few weeks-months.  Wells score 0.  D-dimer 0.63, age adjusts as low risk, less than cut off.  No pneumonia or pulmonary edema noted on CXR.  Patient's lungs clear on exam and breathing comfortably on RA.  WBC 6.1, no fever, therefore very low suspicion for infectious cause.    - trach care per RT - monitor resp status - could consider V/Q scan if SOB does not improve with suctioning of trach  Tracheostomy Secondary to OSA and laryngeal stenosis, seen by ENT at [facility], most recently 04/18/2019.  Clean and in place on exam. - trach care per RT  Atrial Fibrillation and complete heart block s/p PPM 1998 Follows with cardiology, not on anticoagulation given anemia and prior GI bleed 2/2 gastric AVM.  CHADS2-VASC 5, HAS-BLED 3, high risk of major bleed.  Last pacer check on 09/2018. WNL. EKG on admission with pacing spikes, no clear P waves, no changes from previous EKGs.  On metoprolol q6h prn at home. Med rec status pending. Last pacer check 09/2018, was wnl.  - cardiac telemetry - holding metoprolol pending med rec as is prn  HFpEF Last Echo 05/2018 with EF 65-70%, mild concentric hypertrophy, G2DD.  On Lasix 40mg  BID and metoprolol 25mg  q6h prn palpitations.  Does not appear fluid overloaded on exam.  CXR without acute changes. - hold lasix tonight given increase in Cr - reassess volume status in AM  Acute on CKD Stage III GFR now 29, stage IV range.  Cr 1.94 on admission, baseline appears ~1.7.  Euvolemic on exam.  Did not receive contrast for imaging.  No ACE/ARB.  Does take lasix 40mg  BID.   -  monitor Cr - consider nephro consult if no improvement - hold Lasix this PM, reassess Cr and fluid status in AM  Polymyalgia rheumatica: Chronic, stable On prednisone taper at home, but unclear dosing.  Per chart review, patient sees Leafy Kindle, [place] at [facility].  No dose found in chart recently.  Note from 11/26/2018 stated patient was on 7mg  QD at that time.  Also notes stating that she ws on taper.   - will need to vierfy dosing with rheumatologist - would opt to continue pred at 7mg  as do not want to give lower dose than she is on and this is last confirmed dose in chart  HTN: Chronic, stable BP 138/81 on admission.  Takes metoprolol prn at home.  Pending med rec. -  Hold prn metop pending med rec  HLD On Crestor 20mg  QD at home.  Last lipid panel 05/2018 with LDL 106, goal LDL <70.   - cont statin per above  Gout: Chronic, stable Takes allopurinol daily.  No acute gouty flare. - cont allopurinol  Anemia of Chronic Disease: Stable Also has history of Gastric AVMs noted in chart.  Receives fereheme from Dr. Carolin Sicks, most recently 5/5, also appears to be scheduled for injection 5/26. Hgb 10.0 today.  Baseline per chart review 9-10. - due for feraheme on 5/26 - monitor CBC  GAD Xanax 0.5 mg prn qd. Has been taking more recently, up to BID. - cont xanax qd prn - consider avoiding on discharge given age  FEN/GI: NPO pending swallow eval Prophylaxis: SCDs given high bleed risk  Disposition: admit to telemetry, pending PT/OT eval  History of Present Illness:  [name] is a 73 y.o. female presenting with difficulty with word finding for 1 month and possible stroke in ED. patient states that she initially came into the ED this morning because she was having difficulty breathing.  She states that she has a lot of mucus production at her trach site and that this is caused her to have difficulty breathing.  She states that this was not an acute change, and  has been going on for quite some time.  She states that she believes her shortness of breath is caused from her trach, and denies difficulty breathing in her lungs.  Also states that she has been having progressively worsening expressive aphasia for the last month.  She states "I am having trouble finding my words."  Patient also endorses chest pain which she states is chronic, left-sided, occasionally radiates to the back, neck, arm.  She states that this is not exertional chest pain and occurs randomly.  She does not endorse any pain at present.  She also notes that she has been having a headache more frequently and has had change in vision, but shows that she has been on glaucoma drops for this.  In ED, patient received CT head with possible stroke in lower left parietal lobe.  Neurology was consulted and recommended admission for further work-up.   Review Of Systems: Per HPI with the  following additions:   Review of Systems  Constitutional: Negative for chills and fever.  HENT:       Mucus at trach site  Eyes: Positive for blurred vision.  Respiratory: Positive for shortness of breath. Negative for cough and sputum production.   Cardiovascular: Positive for chest pain (chronic, intermittent, non-exertional). Negative for leg swelling.  Gastrointestinal: Negative for abdominal pain, diarrhea, nausea and vomiting.  Genitourinary: Negative for dysuria.  Neurological: Positive for speech change and headaches. Negative for focal weakness, seizures and loss of consciousness.    Patient Active Problem List   Diagnosis Date Noted  . Expressive aphasia 05/08/2019  . Other abnormal glucose 11/26/2018  . Polypharmacy 06/25/2018  . Depression with anxiety   . Tracheostomy in place [facility])   . Tracheostomy care ([place])   . Calculus of gallbladder without cholecystitis without obstruction 12/24/2017  . Chronic cough 09/25/2017  . History of colonic polyps   . Moderate protein-calorie malnutrition  ([place]) 06/02/2017  . Chronic idiopathic constipation 03/19/2017  . 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 Permanent atrial fibrillation   . Laryngeal stenosis 09/18/2016  . Esophageal dysphagia   . Polymyalgia rheumatica ([place]) 04/10/2015  . Hypertrophic cardiomyopathy ([place]) 02/15/2015  . Eczema 09/18/2014  . Environmental allergies 09/18/2014  . Pre-diabetes 11/26/2013  . COPD, moderate ([place]) 11/01/2013  . Complete heart block ([place]) 04/03/2011  . Anemia 02/17/2011  . AVM (arteriovenous malformation) 02/13/2011  . PACEMAKER-[facility] 11/28/2010  . CKD (chronic kidney disease) stage 3, GFR 30-59 ml/min (HCC) 09/24/2010  . Acute on chronic congestive heart failure ([place]) 08/30/2010  . ARTHRITIS 07/24/2010  . Generalized anxiety disorder 07/23/2010  . Hyperlipidemia 02/11/2007  . Essential hypertension 02/11/2007  . APNEA, SLEEP 02/11/2007    Past Medical History: Past Medical History:  Diagnosis Date  . Anemia   . Angina   . Angioedema    2/2 ACE  . Arteriovenous malformation of stomach   . Arthritis   . Asthma   . AVM (arteriovenous malformation) of colon    small intestine; stomach  . Blood transfusion   . CHF (congestive heart failure) ([place])   . Complete heart block ([place])    s/p PPM 1998  . Complication of anesthesia    Difficult airway; anaphylaxis and swelling with propofol  . DDD (degenerative disc disease)   . Depression   . Diastolic heart failure   . Fatty liver 07/26/10  . GERD (gastroesophageal reflux disease)   . GI bleed   . Headache   . History of alcohol abuse Stopped Fall 2012  . History of tobacco use Quit Fall 2012  . Hx of cardiovascular stress test    a. Lexiscan Myoview (10/15):  Small inferolateral and apical defect c/w scar and poss soft tissue attenuation, no ischemia, EF 42%  . Hx of colonic polyp 08/13/10   adenomatous  . Hx of colonoscopy   . Hyperlipidemia   . Hyperlipidemia   . Hypertension   . Hypertrophic cardiomyopathy ([place])    dx by Dr  [name] 2009  . Iron deficiency anemia   . Myocardial infarction ([place])   . Panic attack   . Panic attacks   . Permanent atrial fibrillation   . Pneumonia   . Renal failure    baseline creatinine 1.6  . Right arm pain 01/08/2012  . RLS (restless legs syndrome)    Dx 06/2007  . Shortness of breath    sob on exertation  . Sleep apnea     Past Surgical History: Past  Surgical History:  Procedure Laterality Date  . CARDIAC CATHETERIZATION    . CARDIAC CATHETERIZATION N/A 08/19/2016   Procedure: Right/Left Heart Cath and Coronary Angiography;  Surgeon: Jolaine Artist, MD;  Location: [facility];  Service: Cardiovascular;  Laterality: N/A;  . COLONOSCOPY WITH PROPOFOL N/A 06/16/2017   Procedure: COLONOSCOPY WITH PROPOFOL;  Surgeon: Mauri Pole, MD;  Location: [facility];  Service: Endoscopy;  Laterality: N/A;  . DIRECT LARYNGOSCOPY N/A 09/18/2016   Procedure: DIRECT LARYNGOSCOPY, BRONCHOSCOPY, REMOVAL OF INTUBATION GRANULOMA;  Surgeon: [name], MD;  Location: [facility] OR;  Service: ENT;  Laterality: N/A;  . DIRECT LARYNGOSCOPY N/A 10/20/2016   Procedure: EXTUBATION AND FLEXIBLE LARYNGOSCOPE;  Surgeon: [name], MD;  Location: [facility];  Service: ENT;  Laterality: N/A;  . DIRECT LARYNGOSCOPY N/A 10/29/2016   Procedure: DIRECT LARYNGOSCOPY;  Surgeon: [name], MD;  Location: [facility] OR;  Service: ENT;  Laterality: N/A;  . ESOPHAGOGASTRODUODENOSCOPY  12/23/2011   Procedure: ESOPHAGOGASTRODUODENOSCOPY (EGD);  Surgeon: [name], MD;  Location: [facility];  Service: Endoscopy;  Laterality: N/A;  . ESOPHAGOGASTRODUODENOSCOPY (EGD) WITH PROPOFOL N/A 06/16/2017   Procedure: ESOPHAGOGASTRODUODENOSCOPY (EGD) WITH PROPOFOL;  Surgeon: Mauri Pole, MD;  Location: [facility];  Service: Endoscopy;  Laterality: N/A;  . EXTUBATION (ENDOTRACHEAL) IN OR N/A 07/21/2016   Procedure: EXTUBATION (ENDOTRACHEAL) IN OR;  Surgeon: [name], MD;  Location: [facility];  Service: ENT;   Laterality: N/A;  . GIVENS CAPSULE STUDY  12/23/2011   Procedure: GIVENS CAPSULE STUDY;  Surgeon: [name], MD;  Location: [facility];  Service: Endoscopy;  Laterality: N/A;  . MICROLARYNGOSCOPY WITH LASER N/A 10/16/2016   Procedure: MICRODIRECTLARYNGOSCOPY WITH LASER ABLATION AND KENLOG INJECTION;  Surgeon: [name], MD;  Location: [facility];  Service: ENT;  Laterality: N/A;  . PACEMAKER INSERTION  1998   st jude, most recent gen change by [name] 4/12  . TRACHEOSTOMY TUBE PLACEMENT N/A 10/29/2016   Procedure: TRACHEOSTOMY;  Surgeon: [name], MD;  Location: [facility] OR;  Service: ENT;  Laterality: N/A;  . TUBAL LIGATION  04/01/2000    Social History: Social History   Tobacco Use  . Smoking status: Former Smoker    Packs/day: 0.10    Years: 50.00    Pack years: 5.00    Types: Cigarettes    Last attempt to quit: 08/16/2011    Years since quitting: 7.7  . Smokeless tobacco: Never Used  . Tobacco comment: quit 2012  Substance Use Topics  . Alcohol use: No    Comment: quit 08/16/2011  . Drug use: No   Additional social history: lives with daughter and son Please also refer to relevant sections of EMR.  Family History: Family History  Problem Relation Age of Onset  . Hypertension Mother   . Stroke Mother   . Heart disease Mother   . Aneurysm Mother   . CVA Mother   . Hypertension Father   . Stroke Father   . Heart attack Father   . Alcohol abuse Father   . Hypertension Sister   . Hypertension Brother   . Colon cancer Brother   . Cancer Brother   . Diabetes Sister   . Diabetes Brother   . Heart attack Brother   . Heart attack Brother   . Heart disease Sister   . Alcohol abuse Brother   . Heart disease Daughter     Allergies and Medications: Allergies  Allergen Reactions  . Ace Inhibitors Swelling    angiodema  .  Other Anaphylaxis and Swelling    Reaction to unspecified anesthesia    No current facility-administered medications on file prior to encounter.     Current Outpatient Medications on File Prior to Encounter  Medication Sig Dispense Refill  . acetaminophen (TYLENOL) 325 MG tablet Take 325-650 mg by mouth every 6 (six) hours as needed for mild pain, fever or headache.     . albuterol (PROVENTIL HFA;VENTOLIN HFA) 108 (90 Base) MCG/ACT inhaler Inhale 1-2 puffs into the lungs every 6 (six) hours as needed for wheezing or shortness of breath. 1 Inhaler 6  . allopurinol (ZYLOPRIM) 100 MG tablet Take 2 tablets (200 mg total) by mouth daily. 180 tablet 3  . ALPRAZolam (XANAX) 0.5 MG tablet TAKE 1 TABLET(0.5 MG) BY MOUTH DAILY AS NEEDED FOR ANXIETY 90 tablet 0  . azithromycin (ZITHROMAX) 250 MG tablet Take 1 tablet (250 mg total) by mouth daily. Take 1 every day until finished. 4 tablet 0  . benzonatate (TESSALON) 100 MG capsule Take 1 capsule (100 mg total) by mouth 3 (three) times daily as needed for cough. 21 capsule 0  . benzonatate (TESSALON) 100 MG capsule Take 1 capsule (100 mg total) by mouth every 8 (eight) hours. 21 capsule 0  . cetirizine (ZYRTEC) 10 MG tablet Take 10 mg by mouth daily.    . ferrous sulfate 325 (65 FE) MG tablet TAKE 1 TABLET BY MOUTH TWICE DAILY WITH MEALS TO KEEP BLOOD COUNT UP (Patient taking differently: Take 325 mg by mouth 2 (two) times daily with a meal. ) 60 tablet 11  . furosemide (LASIX) 40 MG tablet Take 1 tablet (40 mg total) by mouth 2 (two) times daily. 180 tablet 3  . guaiFENesin 300 MG/15ML SOLN Take 300 mg by mouth 2 (two) times daily. 1000 mL 3  . ipratropium-albuterol (DUONEB) 0.5-2.5 (3) MG/3ML SOLN USE 3 ML VIA NEBULIZER EVERY 6 HOURS 360 mL 0  . latanoprost (XALATAN) 0.005 % ophthalmic solution Place one drop in both eyes at bedtime.  3  . magnesium hydroxide (MILK OF MAGNESIA) 400 MG/5ML suspension Take 60 mLs by mouth daily as needed for mild constipation.     . metoprolol tartrate (LOPRESSOR) 25 MG tablet TAKE 1 TABLET BY MOUTH EVERY 6 HOURS AS NEEDED FOR HEART FLUTTERING 360 tablet 3  .  nitroGLYCERIN (NITROSTAT) 0.4 MG SL tablet Place 1 tablet (0.4 mg total) under the tongue every 5 (five) minutes as needed for chest pain. 25 tablet 1  . Olopatadine HCl 0.2 % SOLN Place one to two drops in each once daily as needed for itching.  1  . Phenylephrine-DM-GG (ROBAFEN CF MULTI-SYMPTOM COLD) 5-10-100 MG/5ML LIQD Take 10 mLs by mouth daily as needed (cold).     . polyethylene glycol (MIRALAX / GLYCOLAX) packet Take 17 g by mouth daily as needed for mild constipation. 100 each 1  . potassium chloride (K-DUR) 10 MEQ tablet Take 1 tablet (10 mEq total) by mouth daily. 90 tablet 3  . potassium chloride (K-DUR) 10 MEQ tablet TAKE 1 TABLET BY MOUTH DAILY 90 tablet 0  . predniSONE (DELTASONE) 1 MG tablet Take 7 mg by mouth daily with breakfast.     . predniSONE (DELTASONE) 20 MG tablet 2 tabs po daily x 4 days 8 tablet 0  . ranitidine (ZANTAC) 150 MG tablet Take 1 tablet (150 mg total) by mouth daily. 15 tablet 0  . rosuvastatin (CRESTOR) 20 MG tablet Take 1 tablet (20 mg total) by mouth daily. 90 tablet 3  .  senna (SENOKOT) 8.6 MG TABS tablet Take 1 tablet by mouth daily as needed for mild constipation.    . sodium chloride (OCEAN) 0.65 % SOLN nasal spray Place 1 spray into both nostrils daily as needed for congestion.     . triamcinolone cream (KENALOG) 0.1 % Apply 1 application topically 2 (two) times daily. (Patient taking differently: Apply 1 application topically 2 (two) times daily as needed (rash). ) 30 g 2    Objective: BP 123/70   Pulse 70   Temp 98.6 F (37 C) (Oral)   Resp 18   Ht 5\' 3"  (1.6 m)   Wt 78 kg   SpO2 99%   BMI 30.46 kg/m   Physical Exam Constitutional:      General: She is not in acute distress.    Appearance: She is well-developed.  HENT:     Head: Normocephalic and atraumatic.     Mouth/Throat:     Mouth: Mucous membranes are moist.  Eyes:     Pupils: Pupils are equal, round, and reactive to light.  Neck:     Comments: Trach in place, clean  area Cardiovascular:     Rate and Rhythm: Normal rate and regular rhythm.     Pulses: No decreased pulses.  Pulmonary:     Effort: Pulmonary effort is normal.     Breath sounds: Normal breath sounds. No wheezing, rhonchi or rales.  Abdominal:     General: Bowel sounds are normal.     Palpations: Abdomen is soft.     Tenderness: There is no abdominal tenderness.  Musculoskeletal:     Right lower leg: She exhibits no tenderness. No edema.     Left lower leg: She exhibits no tenderness. No edema.  Skin:    General: Skin is warm and dry.  Neurological:     Mental Status: She is alert.     Cranial Nerves: No cranial nerve deficit.     Motor: No weakness.     Comments: Expressive aphasia, understands commands, answers questions appropriately with "yes" and "no", sensation intact throughout  Psychiatric:        Mood and Affect: Mood normal.        Behavior: Behavior normal.      Labs and Imaging: CBC BMET  Recent Labs  Lab 05/08/19 1424  WBC 6.1  HGB 10.0*  HCT 32.5*  PLT 207   Recent Labs  Lab 05/08/19 1424  NA 139  K 3.7  CL 102  CO2 26  BUN 41*  CREATININE 1.94*  GLUCOSE 125*  CALCIUM 9.8     Ct Head Wo Contrast  Result Date: 05/08/2019 CLINICAL DATA:  Shortness of breath, neuro deficit. Stroke suspected. EXAM: CT HEAD WITHOUT CONTRAST TECHNIQUE: Contiguous axial images were obtained from the base of the skull through the vertex without intravenous contrast. COMPARISON:  Head CT dated 03/17/2019. FINDINGS: Brain: Ventricles are normal in size and configuration. Subtle new low-density focus within the lower LEFT parietal lobe white matter. No parenchymal or extra-axial hemorrhage. Vascular: Chronic calcified atherosclerotic changes of the large vessels at the skull base. No unexpected hyperdense vessel. Skull: Normal. Negative for fracture or focal lesion. Sinuses/Orbits: Chronic bilateral maxillary sinus disease. Periorbital and retro-orbital soft tissues are  unremarkable. Other: None. IMPRESSION: 1. Subtle new low-density area within the lower LEFT parietal lobe white matter, suspicious for acute to subacute infarct. Consider MRI for confirmation. 2. No evidence of parenchymal or extra-axial hemorrhage. 3. Chronic bilateral maxillary sinus disease. These results were  called by telephone at the time of interpretation on 05/08/2019 at 4:34 pm to the emergency room physician. Electronically Signed   By: Franki Cabot M.D.   On: 05/08/2019 16:35   Ct Soft Tissue Neck Wo Contrast  Result Date: 05/08/2019 CLINICAL DATA:  Shortness of breath for several weeks. Tracheostomy in place. Neck fullness. EXAM: CT NECK WITHOUT CONTRAST TECHNIQUE: Multidetector CT imaging of the neck was performed following the standard protocol without intravenous contrast. COMPARISON:  Head and neck CTA 03/17/2019 FINDINGS: Pharynx and larynx: No evidence of pharyngeal mass or swelling on this unenhanced study. Patent airway. Tracheostomy tube remains in place and appears well seated. Salivary glands: No inflammation, mass, or stone. Thyroid: Unremarkable. Lymph nodes: No enlarged or suspicious lymph nodes in the neck. Vascular: Limited assessment in the absence of IV contrast. Retropharyngeal course of the distal common and proximal internal carotid arteries bilaterally. Limited intracranial: More fully evaluated on separate head CT. Visualized orbits: Unremarkable. Mastoids and visualized paranasal sinuses: Near, minimally improved complete opacification of the right maxillary sinus from prior. Similar appearance of left maxillary sinus polypoid mucosal thickening. Small left mastoid effusion. Skeleton: Scattered mild periapical dental lucencies. Severe disc space narrowing at C5-6. Upper chest: Reported separately. Other: None. IMPRESSION: No acute abnormality or mass identified in the neck. Electronically Signed   By: Logan Bores M.D.   On: 05/08/2019 17:15   Ct Chest Wo Contrast  Result  Date: 05/08/2019 CLINICAL DATA:  Shortness of breath for several weeks, worsening today. Intermittent chest pain. Tracheostomy patient. EXAM: CT CHEST WITHOUT CONTRAST TECHNIQUE: Multidetector CT imaging of the chest was performed following the standard protocol without IV contrast. COMPARISON:  Radiographs 05/08/2019.  Abdominal CT 07/19/2017. FINDINGS: Cardiovascular: Atherosclerosis of the aorta and great vessels. Right subclavian pacemaker leads extend into the right atrium and right ventricle. There is mild cardiomegaly with a small pericardial effusion versus mild pericardial thickening. Mediastinum/Nodes: There are no enlarged mediastinal, hilar or axillary lymph nodes.Hilar assessment is limited by the lack of intravenous contrast. The patient has a tracheostomy which appears well positioned. The trachea and esophagus appear unremarkable. Lungs/Pleura: There is no pleural effusion or pneumothorax. Mild atelectasis is present at both lung bases. The lungs are otherwise clear. Upper abdomen: No acute findings are seen within the visualized upper abdomen. There are small calcified gallstones. There are bilateral renal cysts. Musculoskeletal/Chest wall: There is no chest wall mass or suspicious osseous finding. IMPRESSION: 1. No acute findings or explanation for the patient's symptoms. 2. Cardiomegaly and Aortic Atherosclerosis (ICD10-I70.0). 3. Neck findings dictated separately. Electronically Signed   By: Richardean Sale M.D.   On: 05/08/2019 16:39   Dg Chest Port 1 View  Result Date: 05/08/2019 CLINICAL DATA:  Shortness of breath EXAM: PORTABLE CHEST 1 VIEW COMPARISON:  04/22/2019 FINDINGS: Tracheostomy. No acute abnormality of the lungs. Cardiomegaly with right chest multi lead pacer. IMPRESSION: Tracheostomy without acute abnormality of the lungs in AP portable projection. Cardiomegaly. Electronically Signed   By: Eddie Candle M.D.   On: 05/08/2019 14:33   Meccariello, Bernita Raisin, DO 05/08/2019, 7:50  PM PGY-1,  [place] Intern pager: [phone], text pages welcome  [place]   I have seen and examined this patient.    I have discussed the findings and exam with the intern and agree with the above note, which I have edited appropriately in [place]. I helped develop the management plan that is described in the resident's note, and I agree with the content.  [place], DO 05/08/2019, 7:50 PM PGY-1, [place] Intern pager: [phone], text pages welcome

## 2019-05-08 NOTE — ED Provider Notes (Signed)
Received the patient in signout from Dr. Jeanell Sparrow, briefly the patient is a 73 year old female with a chief complaint of shortness of breath.  Going on for the past few weeks.  She feels that her throat is tight.  Plan is for CT scan of the chest soft tissue neck and head as the patient has had continued problems with word finding over the past month or longer.  CT scan of the head returned with a new infarct acute versus subacute.  I discussed this with Dr. Malen Gauze who recommended medical admission.  CT scan of the chest was performed without contrast due to worsening of her renal function with a GFR that was below our available protocol.  Had Noncon CT of the chest was negative for pneumonia.  CT scan of the soft tissue neck was without significant abnormality.  I obtained a d-dimer as we are unable to get an angiogram of the chest which is age-adjusted negative.  I feel that pulmonary embolism is unlikely.  Will discuss with family medicine residency for admission.   Deno Etienne, DO 05/08/19 2243

## 2019-05-08 NOTE — ED Triage Notes (Signed)
Pt presents to the ED with shortness of breath that has been going on for a few weeks patient reports that the shortness of breath worsened today and that caused her to come to the ED. Pt also reports intermittent chest pain that she reports has been going on for a few weeks as well. Pt does have some difficulty getting her words out and reports that has been going on for a few weeks and she is following up with speech outpatient for that. Pt is alert and oriented x4 at present time. Pt denies any fevers at home. Pt does have a trach.

## 2019-05-08 NOTE — Consult Note (Signed)
Requesting Physician: Dr. Tyrone Nine    Chief Complaint: shortness of breath, word finding difficulty x 59month  History obtained from: Patient and Chart    HPI:                                                                                                                                       [name] is a 73 y.o. female with past medical history of atrial fibrillation status post pacemaker not on anticoagulation, anemia with GI AVMs, COPD, polymyalgia rheumatica, hypertension, hyperlipidemia, CHF, CKD,  Tracheostomy, sleep apnea, alcohol abuse presents to [facility] emergency room with shortness of breath.  Also noted to have expressive aphasia which patient states stroke happened about 1 month ago.  Shortness of breath thought secondary due to mucous.  Patient had a CT chest which showed no evidence of pneumonia.  CT PE not performed due to patient's CKD however primary team felt low likelihood that she is having pulmonary embolism.  Patient has been complaining of expressive aphasia for greater than 1 month.  A CT head was performed today in the emergency room which showed a subacute left parietal lobe infarct.  Neurology was consulted regarding further recommendations.  Regarding her expressive aphasia, patient presented earlier with this symptom on 3/28 to [facility].   She was evaluated by neurology in the emergency room.  Her symptoms had nearly resolved, unable to confirm whether she had a stroke as MRI brain could not be performed due to noncompatible pacemaker.  Neurology at the time recommended admission for vascular studies however since this was during [place], patient and her family decided to take her home with outpatient work-up.    She followed with Dr. [name] on 4/2 who performed CT of the head and neck which revealed no intracranial or extracranial stenosis.  Patient was started on aspirin 325 mg daily and advised to continue Crestor 20 mg daily.  She advised to follow-up with her  cardiologist to repeat a echocardiogram, which however was not completed.  She has known atrial fibrillation, but cardiology has ruled her out for anticoagulation due to her GI AVMs.  Patient was asked to continue aspirin and Crestor which she has been taking.  Patient is a poor historian and it is unclear if her aphasia worsened.  I reviewed Dr. Georgie Chard note on 4/2 at the time patient noted to have mild aphasia.    Past Medical History:  Diagnosis Date  . Anemia   . Angina   . Angioedema    2/2 ACE  . Arteriovenous malformation of stomach   . Arthritis   . Asthma   . AVM (arteriovenous malformation) of colon    small intestine; stomach  . Blood transfusion   . CHF (congestive heart failure) ([place])   . Complete heart block ([place])    s/p PPM 1998  . Complication of anesthesia    Difficult airway; anaphylaxis and swelling  with propofol  . DDD (degenerative disc disease)   . Depression   . Diastolic heart failure   . Fatty liver 07/26/10  . GERD (gastroesophageal reflux disease)   . GI bleed   . Headache   . History of alcohol abuse Stopped Fall 2012  . History of tobacco use Quit Fall 2012  . Hx of cardiovascular stress test    a. Lexiscan Myoview (10/15):  Small inferolateral and apical defect c/w scar and poss soft tissue attenuation, no ischemia, EF 42%  . Hx of colonic polyp 08/13/10   adenomatous  . Hx of colonoscopy   . Hyperlipidemia   . Hyperlipidemia   . Hypertension   . Hypertrophic cardiomyopathy ([place])    dx by Dr [name] 2009  . Iron deficiency anemia   . Myocardial infarction ([place])   . Panic attack   . Panic attacks   . Permanent atrial fibrillation   . Pneumonia   . Renal failure    baseline creatinine 1.6  . Right arm pain 01/08/2012  . RLS (restless legs syndrome)    Dx 06/2007  . Shortness of breath    sob on exertation  . Sleep apnea     Past Surgical History:  Procedure Laterality Date  . CARDIAC CATHETERIZATION    . CARDIAC CATHETERIZATION N/A  08/19/2016   Procedure: Right/Left Heart Cath and Coronary Angiography;  Surgeon: Jolaine Artist, MD;  Location: [place] [facility];  Service: Cardiovascular;  Laterality: N/A;  . COLONOSCOPY WITH PROPOFOL N/A 06/16/2017   Procedure: COLONOSCOPY WITH PROPOFOL;  Surgeon: Mauri Pole, MD;  Location: [facility];  Service: Endoscopy;  Laterality: N/A;  . DIRECT LARYNGOSCOPY N/A 09/18/2016   Procedure: DIRECT LARYNGOSCOPY, BRONCHOSCOPY, REMOVAL OF INTUBATION GRANULOMA;  Surgeon: [name], MD;  Location: [facility] OR;  Service: ENT;  Laterality: N/A;  . DIRECT LARYNGOSCOPY N/A 10/20/2016   Procedure: EXTUBATION AND FLEXIBLE LARYNGOSCOPE;  Surgeon: [name], MD;  Location: [place];  Service: ENT;  Laterality: N/A;  . DIRECT LARYNGOSCOPY N/A 10/29/2016   Procedure: DIRECT LARYNGOSCOPY;  Surgeon: [name], MD;  Location[facility] OR;  Service: ENT;  Laterality: N/A;  . ESOPHAGOGASTRODUODENOSCOPY  12/23/2011   Procedure: ESOPHAGOGASTRODUODENOSCOPY (EGD);  Surgeon: [name], MD;  Location: [facility];  Service: Endoscopy;  Laterality: N/A;  . ESOPHAGOGASTRODUODENOSCOPY (EGD) WITH PROPOFOL N/A 06/16/2017   Procedure: ESOPHAGOGASTRODUODENOSCOPY (EGD) WITH PROPOFOL;  Surgeon: Mauri Pole, MD;  Location: [facility];  Service: Endoscopy;  Laterality: N/A;  . EXTUBATION (ENDOTRACHEAL) IN OR N/A 07/21/2016   Procedure: EXTUBATION (ENDOTRACHEAL) IN OR;  Surgeon: [name], MD;  Location: [place];  Service: ENT;  Laterality: N/A;  . GIVENS CAPSULE STUDY  12/23/2011   Procedure: GIVENS CAPSULE STUDY;  Surgeon: [name], MD;  Location: [facility];  Service: Endoscopy;  Laterality: N/A;  . MICROLARYNGOSCOPY WITH LASER N/A 10/16/2016   Procedure: MICRODIRECTLARYNGOSCOPY WITH LASER ABLATION AND KENLOG INJECTION;  Surgeon: [name], MD;  Location: [place];  Service: ENT;  Laterality: N/A;  . PACEMAKER INSERTION  1998   st jude, most recent gen change by [name] 4/12  . TRACHEOSTOMY TUBE PLACEMENT  N/A 10/29/2016   Procedure: TRACHEOSTOMY;  Surgeon: [name], MD;  Location: [place];  Service: ENT;  Laterality: N/A;  . TUBAL LIGATION  04/01/2000    Family History  Problem Relation Age of Onset  . Hypertension Mother   . Stroke Mother   . Heart disease Mother   . Aneurysm Mother   .  CVA Mother   . Hypertension Father   . Stroke Father   . Heart attack Father   . Alcohol abuse Father   . Hypertension Sister   . Hypertension Brother   . Colon cancer Brother   . Cancer Brother   . Diabetes Sister   . Diabetes Brother   . Heart attack Brother   . Heart attack Brother   . Heart disease Sister   . Alcohol abuse Brother   . Heart disease Daughter    Social History:  reports that she quit smoking about 7 years ago. Her smoking use included cigarettes. She has a 5.00 pack-year smoking history. She has never used smokeless tobacco. She reports that she does not drink alcohol or use drugs.  Allergies:  Allergies  Allergen Reactions  . Ace Inhibitors Swelling    angiodema  . Other Anaphylaxis and Swelling    Reaction to unspecified anesthesia     Medications:                                                                                                                        I reviewed home medications   ROS:                                                                                                                                     14 systems reviewed and negative except above   Examination:                                                                                                      General: Appears well-developed  Psych: Affect appropriate to situation Eyes: No scleral injection HENT: No OP obstrucion Head: Normocephalic.  Cardiovascular: regular rate and rhythm.  Respiratory: Effort normal, trach in place GI: Soft.  No distension. There is no tenderness.  Skin: WDI    Neurological Examination Mental Status: Alert, oriented to place, person.  Difficulty with expression, Repition and comprehension intact. Dysarthria.  II: Visual  fields grossly normal,  III,IV, VI: ptosis not present, extra-ocular motions intact bilaterally, pupils equal, round, reactive to light and accommodation V,VII: smile symmetric, facial light touch sensation normal bilaterally VIII: hearing normal bilaterally IX,X: uvula rises symmetrically XI: bilateral shoulder shrug XII: midline tongue extension Motor: Right : Upper extremity   5/5    Left:     Upper extremity   5/5  Lower extremity   5/5     Lower extremity   5/5 Tone and bulk:normal tone throughout; no atrophy noted Sensory: Pinprick and light touch intact and symmetric on both sides  Deep Tendon Reflexes: 2+ over both patella Plantars: Right: downgoing   Left: downgoing Cerebellar: normal finger-to-nose Gait: not assessed      Lab Results: Basic Metabolic Panel: Recent Labs  Lab 05/08/19 1424  NA 139  K 3.7  CL 102  CO2 26  GLUCOSE 125*  BUN 41*  CREATININE 1.94*  CALCIUM 9.8    CBC: Recent Labs  Lab 05/08/19 1424  WBC 6.1  NEUTROABS 4.4  HGB 10.0*  HCT 32.5*  MCV 97.0  PLT 207    Coagulation Studies: No results for input(s): LABPROT, INR in the last 72 hours.  Imaging: Ct Head Wo Contrast  Result Date: 05/08/2019 CLINICAL DATA:  Shortness of breath, neuro deficit. Stroke suspected. EXAM: CT HEAD WITHOUT CONTRAST TECHNIQUE: Contiguous axial images were obtained from the base of the skull through the vertex without intravenous contrast. COMPARISON:  Head CT dated 03/17/2019. FINDINGS: Brain: Ventricles are normal in size and configuration. Subtle new low-density focus within the lower LEFT parietal lobe white matter. No parenchymal or extra-axial hemorrhage. Vascular: Chronic calcified atherosclerotic changes of the large vessels at the skull base. No unexpected hyperdense vessel. Skull: Normal. Negative for fracture or focal lesion. Sinuses/Orbits: Chronic bilateral  maxillary sinus disease. Periorbital and retro-orbital soft tissues are unremarkable. Other: None. IMPRESSION: 1. Subtle new low-density area within the lower LEFT parietal lobe white matter, suspicious for acute to subacute infarct. Consider MRI for confirmation. 2. No evidence of parenchymal or extra-axial hemorrhage. 3. Chronic bilateral maxillary sinus disease. These results were called by telephone at the time of interpretation on 05/08/2019 at 4:34 pm to the emergency room physician. Electronically Signed   By: Franki Cabot M.D.   On: 05/08/2019 16:35   Ct Soft Tissue Neck Wo Contrast  Result Date: 05/08/2019 CLINICAL DATA:  Shortness of breath for several weeks. Tracheostomy in place. Neck fullness. EXAM: CT NECK WITHOUT CONTRAST TECHNIQUE: Multidetector CT imaging of the neck was performed following the standard protocol without intravenous contrast. COMPARISON:  Head and neck CTA 03/17/2019 FINDINGS: Pharynx and larynx: No evidence of pharyngeal mass or swelling on this unenhanced study. Patent airway. Tracheostomy tube remains in place and appears well seated. Salivary glands: No inflammation, mass, or stone. Thyroid: Unremarkable. Lymph nodes: No enlarged or suspicious lymph nodes in the neck. Vascular: Limited assessment in the absence of IV contrast. Retropharyngeal course of the distal common and proximal internal carotid arteries bilaterally. Limited intracranial: More fully evaluated on separate head CT. Visualized orbits: Unremarkable. Mastoids and visualized paranasal sinuses: Near, minimally improved complete opacification of the right maxillary sinus from prior. Similar appearance of left maxillary sinus polypoid mucosal thickening. Small left mastoid effusion. Skeleton: Scattered mild periapical dental lucencies. Severe disc space narrowing at C5-6. Upper chest: Reported separately. Other: None. IMPRESSION: No acute abnormality or mass identified in the neck. Electronically Signed   By:  Logan Bores M.D.   On: 05/08/2019  17:15   Ct Chest Wo Contrast  Result Date: 05/08/2019 CLINICAL DATA:  Shortness of breath for several weeks, worsening today. Intermittent chest pain. Tracheostomy patient. EXAM: CT CHEST WITHOUT CONTRAST TECHNIQUE: Multidetector CT imaging of the chest was performed following the standard protocol without IV contrast. COMPARISON:  Radiographs 05/08/2019.  Abdominal CT 07/19/2017. FINDINGS: Cardiovascular: Atherosclerosis of the aorta and great vessels. Right subclavian pacemaker leads extend into the right atrium and right ventricle. There is mild cardiomegaly with a small pericardial effusion versus mild pericardial thickening. Mediastinum/Nodes: There are no enlarged mediastinal, hilar or axillary lymph nodes.Hilar assessment is limited by the lack of intravenous contrast. The patient has a tracheostomy which appears well positioned. The trachea and esophagus appear unremarkable. Lungs/Pleura: There is no pleural effusion or pneumothorax. Mild atelectasis is present at both lung bases. The lungs are otherwise clear. Upper abdomen: No acute findings are seen within the visualized upper abdomen. There are small calcified gallstones. There are bilateral renal cysts. Musculoskeletal/Chest wall: There is no chest wall mass or suspicious osseous finding. IMPRESSION: 1. No acute findings or explanation for the patient's symptoms. 2. Cardiomegaly and Aortic Atherosclerosis (ICD10-I70.0). 3. Neck findings dictated separately. Electronically Signed   By: Richardean Sale M.D.   On: 05/08/2019 16:39   Dg Chest Port 1 View  Result Date: 05/08/2019 CLINICAL DATA:  Shortness of breath EXAM: PORTABLE CHEST 1 VIEW COMPARISON:  04/22/2019 FINDINGS: Tracheostomy. No acute abnormality of the lungs. Cardiomegaly with right chest multi lead pacer. IMPRESSION: Tracheostomy without acute abnormality of the lungs in AP portable projection. Cardiomegaly. Electronically Signed   By: Eddie Candle  M.D.   On: 05/08/2019 14:33     ASSESSMENT AND PLAN   73 y.o. female with past medical history of atrial fibrillation status post pacemaker not on anticoagulation, anemia with GI AVMs, COPD, polymyalgia rheumatica, hypertension, hyperlipidemia, CHF, CKD,  Tracheostomy, sleep apnea, alcohol abuse expressive aphasia x 2 months. I reviewed the CT head from 4/2 and 3/28  there was no obvious infarct on CT scan. Smaller infarcts can often missed on earlier CTs, I do expect some changes to show up in 5 days.  Upon reviewing current CT, it is difficult to ascertain time this may have occurred, could be anywhere between 48 hours to few months. MRI was not available to confirm stroke on 3/28 and due to permanent pacemaker that is not compatible, a MRI of the brain cannot be performed now.  Unfortunately, CT scans are not the best study in establishing a timeline, particularly subcortical infarcts.   It is possible that current CT findings may be reflective of the stroke that occurred in March missed on earlier CTs vs  possible expansion of that the infarct vs new embolic event. Patient denies another episode of worsening speech to suggest this is a new infarct.  CTA done on 4/2 did not show any evidence of intracranial atherosclerotic disease.  So likely etiology of her stroke would be cardioembolic disease.  Subacute Left Parietal White Matter infarction Transcortical Motor Aphasia   Risk factors; HTN, HLD, CHF,CKD, Atrial fibrillation Etiology: ? cardioembolic  Recommendations  #Transthoracic Echo  # PCM  Interrogation  # Continue ASA, consider discussion with GI regarding treatment for AVMs.  #Continue 20 mg of Crestor # BP goal: normotension  # HBAIC and Lipid profile # Telemetry monitoring # Frequent neuro checks # Stroke swallow screen  Please page stroke NP  Or  PA  Or MD from 8am -4 pm  as this patient  from this time will be  followed by the stroke.   You can look them up on  [email or web address]  Password [facility]   [name] [facility] Pager Number [phone]

## 2019-05-09 ENCOUNTER — Inpatient Hospital Stay (HOSPITAL_COMMUNITY): Payer: Medicare Other

## 2019-05-09 ENCOUNTER — Other Ambulatory Visit (HOSPITAL_COMMUNITY): Payer: Medicare Other

## 2019-05-09 DIAGNOSIS — I361 Nonrheumatic tricuspid (valve) insufficiency: Secondary | ICD-10-CM

## 2019-05-09 DIAGNOSIS — I6310 Cerebral infarction due to embolism of unspecified precerebral artery: Secondary | ICD-10-CM

## 2019-05-09 DIAGNOSIS — I6340 Cerebral infarction due to embolism of unspecified cerebral artery: Secondary | ICD-10-CM

## 2019-05-09 DIAGNOSIS — I480 Paroxysmal atrial fibrillation: Secondary | ICD-10-CM

## 2019-05-09 DIAGNOSIS — N183 Chronic kidney disease, stage 3 (moderate): Secondary | ICD-10-CM

## 2019-05-09 DIAGNOSIS — R0600 Dyspnea, unspecified: Secondary | ICD-10-CM

## 2019-05-09 DIAGNOSIS — N179 Acute kidney failure, unspecified: Secondary | ICD-10-CM

## 2019-05-09 DIAGNOSIS — I63412 Cerebral infarction due to embolism of left middle cerebral artery: Secondary | ICD-10-CM

## 2019-05-09 DIAGNOSIS — R4701 Aphasia: Secondary | ICD-10-CM

## 2019-05-09 DIAGNOSIS — I340 Nonrheumatic mitral (valve) insufficiency: Secondary | ICD-10-CM

## 2019-05-09 DIAGNOSIS — Z930 Tracheostomy status: Secondary | ICD-10-CM

## 2019-05-09 LAB — CBC
HCT: 30.7 % — ABNORMAL LOW (ref 36.0–46.0)
Hemoglobin: 9.7 g/dL — ABNORMAL LOW (ref 12.0–15.0)
MCH: 30 pg (ref 26.0–34.0)
MCHC: 31.6 g/dL (ref 30.0–36.0)
MCV: 95 fL (ref 80.0–100.0)
Platelets: 211 10*3/uL (ref 150–400)
RBC: 3.23 MIL/uL — ABNORMAL LOW (ref 3.87–5.11)
RDW: 15.3 % (ref 11.5–15.5)
WBC: 3.5 10*3/uL — ABNORMAL LOW (ref 4.0–10.5)
nRBC: 0 % (ref 0.0–0.2)

## 2019-05-09 LAB — LIPID PANEL
Cholesterol: 136 mg/dL (ref 0–200)
HDL: 60 mg/dL (ref >40)
LDL Cholesterol: 56 mg/dL (ref 0–99)
Total CHOL/HDL Ratio: 2.3 RATIO
Triglycerides: 99 mg/dL (ref <150)
VLDL: 20 mg/dL (ref 0–40)

## 2019-05-09 LAB — BASIC METABOLIC PANEL
Anion gap: 9 (ref 5–15)
BUN: 28 mg/dL — ABNORMAL HIGH (ref 8–23)
CO2: 30 mmol/L (ref 22–32)
Calcium: 9.7 mg/dL (ref 8.9–10.3)
Chloride: 102 mmol/L (ref 98–111)
Creatinine, Ser: 1.51 mg/dL — ABNORMAL HIGH (ref 0.44–1.00)
GFR calc Af Amer: 39 mL/min — ABNORMAL LOW (ref >60)
GFR calc non Af Amer: 33 mL/min — ABNORMAL LOW (ref >60)
Glucose, Bld: 111 mg/dL — ABNORMAL HIGH (ref 70–99)
Potassium: 3.3 mmol/L — ABNORMAL LOW (ref 3.5–5.1)
Sodium: 141 mmol/L (ref 135–145)

## 2019-05-09 LAB — ECHOCARDIOGRAM COMPLETE
Height: 63 in
Weight: 2751.34 oz

## 2019-05-09 LAB — MAGNESIUM: Magnesium: 1.8 mg/dL (ref 1.7–2.4)

## 2019-05-09 MED ORDER — ROSUVASTATIN CALCIUM 20 MG PO TABS
20.0000 mg | ORAL_TABLET | Freq: Every day | ORAL | Status: DC
  Administered 2019-05-09 – 2019-05-10 (×2): 20 mg via ORAL
  Filled 2019-05-09 (×2): qty 1

## 2019-05-09 MED ORDER — ALPRAZOLAM 0.5 MG PO TABS
0.5000 mg | ORAL_TABLET | Freq: Once | ORAL | Status: AC
  Administered 2019-05-09: 0.5 mg via ORAL
  Filled 2019-05-09: qty 1

## 2019-05-09 MED ORDER — ATORVASTATIN CALCIUM 80 MG PO TABS: 80.0000 mg | ORAL_TABLET | Freq: Every day | ORAL | Status: DC

## 2019-05-09 MED ORDER — APIXABAN 2.5 MG PO TABS
2.5000 mg | ORAL_TABLET | Freq: Two times a day (BID) | ORAL | Status: DC
  Administered 2019-05-10: 2.5 mg via ORAL
  Filled 2019-05-09: qty 1

## 2019-05-09 MED ORDER — ENOXAPARIN SODIUM 30 MG/0.3ML ~~LOC~~ SOLN
30.0000 mg | SUBCUTANEOUS | Status: DC
  Administered 2019-05-09: 30 mg via SUBCUTANEOUS
  Filled 2019-05-09: qty 0.3

## 2019-05-09 MED ORDER — FUROSEMIDE 40 MG PO TABS
40.0000 mg | ORAL_TABLET | Freq: Two times a day (BID) | ORAL | Status: DC
  Administered 2019-05-09 – 2019-05-11 (×5): 40 mg via ORAL
  Filled 2019-05-09 (×5): qty 1

## 2019-05-09 MED ORDER — POTASSIUM CHLORIDE CRYS ER 20 MEQ PO TBCR
40.0000 meq | EXTENDED_RELEASE_TABLET | Freq: Once | ORAL | Status: AC
  Administered 2019-05-09: 40 meq via ORAL
  Filled 2019-05-09: qty 2

## 2019-05-09 MED ORDER — APIXABAN 2.5 MG PO TABS: 2.5000 mg | ORAL_TABLET | Freq: Two times a day (BID) | ORAL | Status: DC

## 2019-05-09 NOTE — Evaluation (Signed)
Physical Therapy Evaluation Patient Details Name: [name] MRN: [id number] DOB: 03-16-1946 Today's Date: 05/09/2019   History of Present Illness  Pt is a 73 y/o female who presents with dyspnea and worsening expressive aphasia. PMH significant for complete heart block and a-fib s/p PPM 1998, COPD, polymyalgia rheumatica, HTN, Gout, CHF, CKD III, Anemia with AVMs of stomach, tracheostomy. CT revealed new low-density area within the lower left-parietal lobe suspicious for acute to subacute infarct.   Clinical Impression  Pt admitted with above diagnosis. Pt currently with functional limitations due to the deficits listed below (see PT Problem List). At the time of PT eval pt was able to perform transfers and ambulation with gross min assist for balance support and safety. Feel pt would benefit from RW use at this time for energy conservation and to decrease risk for falls. Recommending CIR consult to maximize functional independence and return home with children. Although tolerance for functional activity is decreased at this time, feel she could tolerate the increased intensity of CIR level rehab. Acutely, pt will benefit from skilled PT to increase their independence and safety with mobility to allow discharge to the venue listed below.       Follow Up Recommendations CIR    Equipment Recommendations  None recommended by PT    Recommendations for Other Services Rehab consult     Precautions / Restrictions Precautions Precautions: Fall Precaution Comments: Chronic trach with PMV safety-pinned to gown Restrictions Weight Bearing Restrictions: No      Mobility  Bed Mobility Overal bed mobility: Needs Assistance Bed Mobility: Supine to Sit     Supine to sit: Min guard     General bed mobility comments: Close guard for safety as pt transitions to EOB.   Transfers Overall transfer level: Needs assistance Equipment used: None Transfers: Sit to/from Stand Sit to Stand:  Min guard         General transfer comment: Hands on guarding as pt powered up to full standing position. Initially appears steady however when weight shifting to initiate ambulation requires HHA.   Ambulation/Gait Ambulation/Gait assistance: Min assist Gait Distance (Feet): 150 Feet Assistive device: 1 person hand held assist Gait Pattern/deviations: Step-through pattern;Decreased stride length;Trunk flexed Gait velocity: Decreased Gait velocity interpretation: <1.8 ft/sec, indicate of risk for recurrent falls General Gait Details: Attempted without RW initially however pt requiring HHA and railing use for balance support and enregy conservation. 1 standing rest break after ~75' and pt with SOB.   Stairs            Wheelchair Mobility    Modified Rankin (Stroke Patients Only) Modified Rankin (Stroke Patients Only) Pre-Morbid Rankin Score: Slight disability Modified Rankin: Moderately severe disability     Balance Overall balance assessment: Needs assistance Sitting-balance support: Feet supported;No upper extremity supported Sitting balance-Leahy Scale: Fair     Standing balance support: No upper extremity supported;During functional activity Standing balance-Leahy Scale: Poor Standing balance comment: Requires at least 1 HHA                              Pertinent Vitals/Pain Pain Assessment: Faces Faces Pain Scale: No hurt    Home Living Family/patient expects to be discharged to:: Private residence Living Arrangements: Children Available Help at Discharge: Family;Available 24 hours/day Type of Home: House Home Access: Stairs to enter Entrance Stairs-Rails: Right Entrance Stairs-Number of Steps: "a lot" Home Layout: One level Home Equipment: Walker - 2 wheels;Bedside commode  Prior Function Level of Independence: Needs assistance   Gait / Transfers Assistance Needed: independent with mobility   ADL's / Homemaking Assistance Needed:  Reports independent with ADLs. Pt states she can typically ambulate around the grocery store and get everything she needs without AD.         Hand Dominance   Dominant Hand: Right    Extremity/Trunk Assessment   Upper Extremity Assessment Upper Extremity Assessment: Defer to OT evaluation    Lower Extremity Assessment Lower Extremity Assessment: Generalized weakness    Cervical / Trunk Assessment Cervical / Trunk Assessment: Normal  Communication   Communication: Expressive difficulties  Cognition Arousal/Alertness: Awake/alert Behavior During Therapy: WFL for tasks assessed/performed Overall Cognitive Status: Difficult to assess                                 General Comments: Oriented, but expressive aphasia is limiting full cognitive evaluation      General Comments      Exercises     Assessment/Plan    PT Assessment Patient needs continued PT services  PT Problem List Decreased strength;Decreased activity tolerance;Decreased balance;Decreased mobility;Decreased knowledge of use of DME;Decreased safety awareness;Decreased knowledge of precautions;Cardiopulmonary status limiting activity       PT Treatment Interventions DME instruction;Gait training;Stair training;Functional mobility training;Therapeutic activities;Therapeutic exercise;Neuromuscular re-education;Patient/family education    PT Goals (Current goals can be found in the Care Plan section)  Acute Rehab PT Goals Patient Stated Goal: Return home with children PT Goal Formulation: With patient Time For Goal Achievement: 05/23/19 Potential to Achieve Goals: Good    Frequency Min 4X/week   Barriers to discharge        Co-evaluation               AM-PAC PT "6 Clicks" Mobility  Outcome Measure Help needed turning from your back to your side while in a flat bed without using bedrails?: None Help needed moving from lying on your back to sitting on the side of a flat bed without  using bedrails?: None Help needed moving to and from a bed to a chair (including a wheelchair)?: A Little Help needed standing up from a chair using your arms (e.g., wheelchair or bedside chair)?: A Little Help needed to walk in hospital room?: A Little Help needed climbing 3-5 steps with a railing? : A Little 6 Click Score: 20    End of Session Equipment Utilized During Treatment: Gait belt Activity Tolerance: Patient tolerated treatment well Patient left: in chair;with call bell/phone within reach;with chair alarm set Nurse Communication: Mobility status PT Visit Diagnosis: Unsteadiness on feet (R26.81);Other symptoms and signs involving the nervous system (R29.898)    Time: 1610-9604 PT Time Calculation (min) (ACUTE ONLY): 30 min   Charges:   PT Evaluation $PT Eval Moderate Complexity: 1 Mod PT Treatments $Gait Training: 8-22 mins        Rolinda Roan, PT, DPT Acute Rehabilitation Services Pager: [phone] Office: [place] 05/09/2019, 9:34 AM

## 2019-05-09 NOTE — Progress Notes (Signed)
Rehab Admissions Coordinator Note:  Patient was screened by Jhonnie Garner for appropriateness for an Inpatient Acute Rehab Consult.  At this time, we are recommending Inpatient Rehab consult. AC will contact MD to request order.  Jhonnie Garner 05/09/2019, 9:44 AM  I can be reached at [phone].

## 2019-05-09 NOTE — Evaluation (Signed)
Occupational Therapy Evaluation Patient Details Name: [name] MRN: [id number] DOB: June 17, 1946 Today's Date: 05/09/2019    History of Present Illness Pt is a 73 y/o female who presents with dyspnea and worsening expressive aphasia. PMH significant for complete heart block and a-fib s/p PPM 1998, COPD, polymyalgia rheumatica, HTN, Gout, CHF, CKD III, Anemia with AVMs of stomach, tracheostomy. CT revealed new low-density area within the lower left-parietal lobe suspicious for acute to subacute infarct.    Clinical Impression   Pt admitted for above and limited by problem list below, including impaired balance, generalized weakness, expressive aphasia, and decreased activity tolerance.  Patient able to complete transfers with min guard assist, in room mobility with min guard assist, LB ADLs with min assist and grooming at sink with min guard assist.  Patient reports decreased independence for the past several months due to expressive difficulties, resulting in decreased activity tolerance.  Believe she will best benefit from intensive CIR Level rehab in order to maximize independence and safety with ADLs, mobility and IADLs before returning home with family support.     Follow Up Recommendations  CIR;Supervision/Assistance - 24 hour    Equipment Recommendations  3 in 1 bedside commode    Recommendations for Other Services Rehab consult     Precautions / Restrictions Precautions Precautions: Fall Precaution Comments: Chronic trach with PMV safety-pinned to gown Restrictions Weight Bearing Restrictions: No      Mobility Bed Mobility Overal bed mobility: Needs Assistance Bed Mobility: Supine to Sit     Supine to sit: Min guard     General bed mobility comments: Close guard for safety as pt transitions to EOB.   Transfers Overall transfer level: Needs assistance Equipment used: None Transfers: Sit to/from Stand Sit to Stand: Min guard         General transfer  comment: min guard for safety and balance     Balance Overall balance assessment: Needs assistance Sitting-balance support: Feet supported;No upper extremity supported Sitting balance-Leahy Scale: Fair     Standing balance support: No upper extremity supported;During functional activity Standing balance-Leahy Scale: Poor Standing balance comment: Requires at least 1 HHA                            ADL either performed or assessed with clinical judgement   ADL Overall ADL's : Needs assistance/impaired     Grooming: Min guard;Standing Grooming Details (indicate cue type and reason): min guard for safety and balance  Upper Body Bathing: Supervision/ safety;Set up;Sitting   Lower Body Bathing: Min guard;Sit to/from stand   Upper Body Dressing : Set up;Sitting   Lower Body Dressing: Minimal assistance;Sit to/from stand   Toilet Transfer: Min guard;Ambulation Toilet Transfer Details (indicate cue type and reason): simulated in room         Functional mobility during ADLs: Min guard General ADL Comments: pt limited by generalized weakness, impaired balance, decreased act tolerance and expressive aphasia     Vision   Vision Assessment?: No apparent visual deficits     Perception     Praxis      Pertinent Vitals/Pain Pain Assessment: Faces Faces Pain Scale: No hurt     Hand Dominance Right   Extremity/Trunk Assessment Upper Extremity Assessment Upper Extremity Assessment: Generalized weakness   Lower Extremity Assessment Lower Extremity Assessment: Defer to PT evaluation   Cervical / Trunk Assessment Cervical / Trunk Assessment: Normal   Communication Communication Communication: Expressive difficulties   Cognition  Arousal/Alertness: Awake/alert Behavior During Therapy: WFL for tasks assessed/performed Overall Cognitive Status: Difficult to assess                                 General Comments: Oriented, but expressive aphasia is  limiting full cognitive evaluation   General Comments       Exercises     Shoulder Instructions      Home Living Family/patient expects to be discharged to:: Private residence Living Arrangements: Children Available Help at Discharge: Family;Available 24 hours/day Type of Home: House Home Access: Stairs to enter [employer] of Steps: "a lot" Entrance Stairs-Rails: Right Home Layout: One level     Bathroom Shower/Tub: Teacher, early years/pre: Standard     Home Equipment: Environmental consultant - 2 wheels;Bedside commode          Prior Functioning/Environment Level of Independence: Needs assistance  Gait / Transfers Assistance Needed: independent with mobility  ADL's / Homemaking Assistance Needed: Reports independent with ADLs. Pt states she can typically ambulate around the grocery store and get everything she needs without AD.  Communication / Swallowing Assistance Needed: Reports word finding difficulty for "months" however came to the hospital with acute worsening.           OT Problem List: Decreased strength;Decreased activity tolerance;Impaired balance (sitting and/or standing);Decreased safety awareness;Decreased knowledge of use of DME or AE;Other (comment)(imapired communication)      OT Treatment/Interventions: Self-care/ADL training;Therapeutic exercise;DME and/or AE instruction;Therapeutic activities;Cognitive remediation/compensation;Patient/family education;Balance training    OT Goals(Current goals can be found in the care plan section) Acute Rehab OT Goals Patient Stated Goal: Return home with children OT Goal Formulation: With patient Time For Goal Achievement: 05/23/19 Potential to Achieve Goals: Good  OT Frequency: Min 3X/week   Barriers to D/C:            Co-evaluation              AM-PAC OT "6 Clicks" Daily Activity     Outcome Measure Help from another person eating meals?: None Help from another person taking care of  personal grooming?: A Little Help from another person toileting, which includes using toliet, bedpan, or urinal?: A Little Help from another person bathing (including washing, rinsing, drying)?: A Little Help from another person to put on and taking off regular upper body clothing?: None Help from another person to put on and taking off regular lower body clothing?: A Little 6 Click Score: 20   End of Session Equipment Utilized During Treatment: Gait belt Nurse Communication: Mobility status  Activity Tolerance: Patient tolerated treatment well Patient left: Other (comment)(with PT)  OT Visit Diagnosis: Muscle weakness (generalized) (M62.81);Unsteadiness on feet (R26.81);Cognitive communication deficit (R41.841) Symptoms and signs involving cognitive functions: Cerebral infarction                Time: 2800-3491 OT Time Calculation (min): 20 min Charges:  OT General Charges $OT Visit: 1 Visit OT Evaluation $OT Eval Moderate Complexity: 1 Mod  Delight Stare, OT Acute Rehabilitation Services Pager [phone] Office [phone]    Delight Stare 05/09/2019, 11:31 AM

## 2019-05-09 NOTE — Progress Notes (Signed)
  Speech Language Pathology Treatment: Cognitive-Linquistic(Aphasia )  Patient Details Name: [name] MRN: [id number] DOB: 08-Jul-1946 Today's Date: 05/09/2019 Time: 9702-6378 SLP Time Calculation (min) (ACUTE ONLY): 20 min  Assessment / Plan / Recommendation Clinical Impression  Pt was seen for aphasia treatment and was cooperative throughout the session. She was able to participate in simple conversation with min-mod support from this SLP. She achieved 40% accuracy with sentence completion increasing to 100% accuracy with phonemic cues. She demonstrated 50% accuracy confrontational naming increasing to 100% accuracy with mod cues. She answered complex yes/no questions with 80% accuracy when additional processing time was provided. SLP will continue to follow pt.    HPI HPI: Pt is a 73 y.o. female who presented with dyspnea and worsening expressive aphasia. PMH is significant for complete heart block and a fib s/p PPM 1998, COPD, polymyalgia rheumatica, HLD, HTN, Gout, GERD, CHF, CKD III, Anemia with AVMs of stomach, sleep apnea, tracheostomy, remote hx alcohol abuse, trach with [place] Speaking Valve since 2017, and GAD. CT of the head showed subtle new low-density area within the lower LEFT parietal lobe white matter, suspicious for acute to subacute infarct.      SLP Plan  Continue with current plan of care  Patient needs continued Speech Lanaguage Pathology Services    Recommendations                   Follow up Recommendations: Inpatient Rehab SLP Visit Diagnosis: Aphasia (R47.01) Plan: Continue with current plan of care       [name] I. Hardin Negus, [place], [place] Office number [phone] Pager [phone]                 Horton Marshall 05/09/2019, 1:02 PM

## 2019-05-09 NOTE — Progress Notes (Signed)
  Echocardiogram 2D Echocardiogram has been performed.  [name] M 05/09/2019, 3:48 PM

## 2019-05-09 NOTE — Progress Notes (Signed)
STROKE TEAM PROGRESS NOTE   HISTORY OF PRESENT ILLNESS (per record) [name] is a 73 y.o. female with past medical history of atrial fibrillation status post pacemaker not on anticoagulation, anemia with GI AVMs, COPD, polymyalgia rheumatica, hypertension, hyperlipidemia, CHF, CKD,  Tracheostomy, sleep apnea, alcohol abuse presents to [facility] emergency room with shortness of breath.  Also noted to have expressive aphasia which patient states stroke happened about 1 month ago.  Shortness of breath thought secondary due to mucous.  Patient had a CT chest which showed no evidence of pneumonia.  CT PE not performed due to patient's CKD however primary team felt low likelihood that she is having pulmonary embolism.  Patient has been complaining of expressive aphasia for greater than 1 month.  A CT head was performed today in the emergency room which showed a subacute left parietal lobe infarct.  Neurology was consulted regarding further recommendations.  Regarding her expressive aphasia, patient presented earlier with this symptom on 3/28 to [facility] and was evaluated by neurology in the emergency room.  Her symptoms had nearly resolved, unable to confirm whether she had a stroke as MRI brain could not be performed due to noncompatible pacemaker.  Neurology at the time recommended admission for vascular studies however since this was during [place], patient and her family decided to take her home with outpatient work-up.    She followed with Dr. [name] on 4/2 who performed CT of the head and neck which revealed no intracranial or extracranial stenosis.  Patient was started on aspirin 325 mg daily and advised to continue Crestor 20 mg daily.  She advised to follow-up with her cardiologist to repeat a echocardiogram, which however was not completed.  She has known atrial fibrillation, but cardiology has ruled her out for anticoagulation due to her GI AVMs.  Patient was asked to continue aspirin  and Crestor which she has been taking.  Patient is a poor historian and it is unclear if her aphasia worsened.  I reviewed Dr. Georgie Chard note on 4/2 at the time patient noted to have mild aphasia.   SUBJECTIVE (INTERVAL HISTORY) Her family is not at bedside.  Patient has significant expressive aphasia, speech is at bedside.  States that it has been going on for about a month.    OBJECTIVE Vitals:   05/08/19 2000 05/08/19 2146 05/09/19 0120 05/09/19 0505  BP: 138/81 123/72 109/60 103/65  Pulse: 70 70 70 69  Resp: 17 18 18 20   Temp:  98.5 F (36.9 C) 98.7 F (37.1 C) 98.6 F (37 C)  TempSrc:  Oral Oral Oral  SpO2: 100% 93% 100% 95%  Weight:      Height:        CBC:  Recent Labs  Lab 05/08/19 1424 05/09/19 0402  WBC 6.1 3.5*  NEUTROABS 4.4  --   HGB 10.0* 9.7*  HCT 32.5* 30.7*  MCV 97.0 95.0  PLT 207 665    Basic Metabolic Panel:  Recent Labs  Lab 05/08/19 1424 05/09/19 0402  NA 139 141  K 3.7 3.3*  CL 102 102  CO2 26 30  GLUCOSE 125* 111*  BUN 41* 28*  CREATININE 1.94* 1.51*  CALCIUM 9.8 9.7    Lipid Panel:     Component Value Date/Time   CHOL 136 05/09/2019 0402   CHOL 168 07/16/2017 1555   TRIG 99 05/09/2019 0402   HDL 60 05/09/2019 0402   HDL 48 07/16/2017 1555   CHOLHDL 2.3 05/09/2019 0402   VLDL 20  05/09/2019 0402   LDLCALC 56 05/09/2019 0402   LDLCALC 99 07/16/2017 1555   HgbA1c:  Lab Results  Component Value Date   HGBA1C 6.4 11/26/2018   Urine Drug Screen:     Component Value Date/Time   LABOPIA NONE DETECTED 03/12/2019 1458   COCAINSCRNUR NONE DETECTED 03/12/2019 1458   COCAINSCRNUR NEGATIVE 05/12/2008 1000   LABBENZ NONE DETECTED 03/12/2019 1458   LABBENZ NEGATIVE 05/12/2008 1000   AMPHETMU NONE DETECTED 03/12/2019 1458   THCU NONE DETECTED 03/12/2019 1458   LABBARB NONE DETECTED 03/12/2019 1458    Alcohol Level No results found for: ETH  IMAGING  Ct Head Wo Contrast 05/08/2019 IMPRESSION:  1. Subtle new low-density area  within the lower LEFT parietal lobe white matter, suspicious for acute to subacute infarct. Consider MRI for confirmation.  2. No evidence of parenchymal or extra-axial hemorrhage.  3. Chronic bilateral maxillary sinus disease.    Ct Soft Tissue Neck Wo Contrast  05/08/2019 IMPRESSION:  No acute abnormality or mass identified in the neck.    Ct Chest Wo Contrast 05/08/2019 IMPRESSION:  1. No acute findings or explanation for the patient's symptoms.  2. Cardiomegaly and Aortic Atherosclerosis (ICD10-I70.0).  3. Neck findings dictated separately.   CTA Head and Neck 03/17/2019 IMPRESSION:  No stenotic vascular disease. Carotid arteries in the neck are tortuous, the bifurcations being located in the midline behind the supraglottic region. Minimal carotid bifurcation atherosclerosis without stenosis. Mild atherosclerotic calcification in the carotid siphon regions without significant narrowing. No intracranial stenosis or occlusion. No posterior circulation vascular pathology.  Right maxillary sinusitis. Mucosal thickening and retention cyst at the floor of the left maxillary sinus.    Dg Chest Port 1 View  05/08/2019 IMPRESSION:  Tracheostomy without acute abnormality of the lungs in AP portable projection. Cardiomegaly.    EKG - Paced rate 70 BPM. (See cardiology reading for complete details)   PHYSICAL EXAM Blood pressure 103/65, pulse 69, temperature 98.6 F (37 C), temperature source Oral, resp. rate 20, height 5\' 3"  (1.6 m), weight 78 kg, SpO2 95 %.   Patient is alert, sitting up in bed, slightly anxious, oriented to person, month, year however difficult to understand speech due to expressive aphasia, comprehension appears intact, also dysarthric, blinks to threat in all fields, face symmetric, extraocular motions intact bilaterally, pupils round and reactive to light, face appears symmetric, hearing intact to voice, tongue midline, all extremities antigravity without  drift, tone and bulk normal, intact to light touch, toes equivocal, no ataxia or dysmetria noted.   ASSESSMENT/PLAN [name] is a 73 y.o. female with history of sub acute stroke, atrial fibrillation, not anticoagulated secondary to Gi AVMs, PPM (not MRI compatible), asthma, CHF, angioedema with ACE I, Hld, Htn, panic attacks, Gi bleed, PMR (prednisone), hypertrophic cardiomyopathy, CAD with Mi, OSA, and hx of previous ETOH and tobacco use presenting with dyspnea and aphasia from sub acute stroke. She did not receive IV t-PA due to late presentation (>4.5 hours from time of onset).  Stroke:  lower LEFT parietal lobe white matter subacute infarct, likely due to A. fib not on anticoagulation.  Resultant expressive aphasia and dysarthria  CT head - Subtle new low-density area within the lower LEFT parietal lobe white matter, suspicious for acute to subacute infarct.    MRI head - PPM  MRA head - PPM  CTA H&N - 03/17/2019 - no significant stenosis  Carotid Doppler - CTA neck performed - carotid dopplers not indicated.  2D Echo - to  be performed out-patient at her cardiologist's office  [facility] Virus 2 - negative  LDL - 56  HgbA1c - 6.4  UDS - not performed  VTE prophylaxis - Lovenox  Diet  - Heart healthy with thin liquids.  aspirin 81 mg daily prior to admission, now on Eliquis  Patient counseled to be compliant with her antithrombotic medications  Ongoing aggressive stroke risk factor management  Therapy recommendations:  CIR  Disposition:  Pending  Hypertension  Stable . Permissive hypertension (OK if < 220/120) but gradually normalize in 5-7 days . Long-term BP goal normotensive  Hyperlipidemia  Lipid lowering medication PTA: Crestor 20 mg daily  LDL 56, goal < 70  Current lipid lowering medication: Crestor 20 mg daily  Continue statin at discharge  Other Stroke Risk Factors  Advanced age  Former cigarette smoker - quit 7 years  ago.  Obesity, Body mass index is 30.46 kg/m., recommend weight loss, diet and exercise as appropriate   Family hx stroke (mother and father)  Coronary artery disease  Obstructive sleep apnea  Other Active Problems  Hypokalemia - 3.3 - supplemented  Anemia - 9.7  Creatinine - 1.94->1.51   Hospital day # 1  Stroke likely due to atrial fibrillation not on anticoagulation.  Patient is now on Eliquis.  Stroke will sign off at this time.  Personally examined patient and images, and have participated in and made any corrections needed to history, physical, neuro exam,assessment and plan as stated above.  I have personally obtained the history, evaluated lab date, reviewed imaging studies and agree with radiology interpretations.    Sarina Ill, MD Stroke Neurology   A total of 35 minutes was spent for the care of this patient, spent on counseling patient and family on different diagnostic and therapeutic options, counseling and coordination of care, riskd ans benefits of management, compliance, or risk factor reduction and education.   To contact Stroke Continuity provider, please refer to [email or web address] After hours, contact General Neurology

## 2019-05-09 NOTE — Progress Notes (Signed)
Spoke with son and daughter [name] on the phone today to get an update about the mother's care.  They noted that the family would prefer to not do CIR or inpatient rehab and would prefer to have home health resources so that the children could visit with her mother while she recovers.  We also had discussion about the pros and cons of anticoagulation in this complicated patient, they understand that there are risks in both directions but have agreed with specialist advice to start anticoagulation for mom.  Dr. Criss Rosales

## 2019-05-09 NOTE — Evaluation (Signed)
Speech Language Pathology Evaluation Patient Details Name: [name] MRN: [id number] DOB: [birth year] Today's Date: 05/09/2019 Time: 7939-0300 SLP Time Calculation (min) (ACUTE ONLY): 28 min  Problem List:  Patient Active Problem List   Diagnosis Date Noted  . Expressive aphasia 05/08/2019  . Other abnormal glucose 11/26/2018  . Polypharmacy 06/25/2018  . Depression with anxiety   . Tracheostomy in place [facility])   . Tracheostomy care ([place])   . Calculus of gallbladder without cholecystitis without obstruction 12/24/2017  . Chronic cough 09/25/2017  . History of colonic polyps   . Moderate protein-calorie malnutrition ([place]) 06/02/2017  . Chronic idiopathic constipation 03/19/2017  . 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 Permanent atrial fibrillation   . Laryngeal stenosis 09/18/2016  . Esophageal dysphagia   . Polymyalgia rheumatica ([place]) 04/10/2015  . Hypertrophic cardiomyopathy ([place]) 02/15/2015  . Eczema 09/18/2014  . Environmental allergies 09/18/2014  . Pre-diabetes 11/26/2013  . COPD, moderate ([place]) 11/01/2013  . Complete heart block ([place]) 04/03/2011  . Anemia 02/17/2011  . AVM (arteriovenous malformation) 02/13/2011  . PACEMAKER-St.Jude 11/28/2010  . CKD (chronic kidney disease) stage 3, GFR 30-59 ml/min (HCC) 09/24/2010  . Acute on chronic congestive heart failure ([place]) 08/30/2010  . ARTHRITIS 07/24/2010  . Generalized anxiety disorder 07/23/2010  . Hyperlipidemia 02/11/2007  . Essential hypertension 02/11/2007  . APNEA, SLEEP 02/11/2007   Past Medical History:  Past Medical History:  Diagnosis Date  . Anemia   . Angina   . Angioedema    2/2 ACE  . Arteriovenous malformation of stomach   . Arthritis   . Asthma   . AVM (arteriovenous malformation) of colon    small intestine; stomach  . Blood transfusion   . CHF (congestive heart failure) ([place])   . Complete heart block ([place])    s/p PPM 1998  . Complication of anesthesia    Difficult airway;  anaphylaxis and swelling with propofol  . DDD (degenerative disc disease)   . Depression   . Diastolic heart failure   . Fatty liver 07/26/10  . GERD (gastroesophageal reflux disease)   . GI bleed   . Headache   . History of alcohol abuse Stopped Fall 2012  . History of tobacco use Quit Fall 2012  . Hx of cardiovascular stress test    a. Lexiscan Myoview (10/15):  Small inferolateral and apical defect c/w scar and poss soft tissue attenuation, no ischemia, EF 42%  . Hx of colonic polyp 08/13/10   adenomatous  . Hx of colonoscopy   . Hyperlipidemia   . Hyperlipidemia   . Hypertension   . Hypertrophic cardiomyopathy ([place])    dx by Dr [name] 2009  . Iron deficiency anemia   . Myocardial infarction ([place])   . Panic attack   . Panic attacks   . Permanent atrial fibrillation   . Pneumonia   . Renal failure    baseline creatinine 1.6  . Right arm pain 01/08/2012  . RLS (restless legs syndrome)    Dx 06/2007  . Shortness of breath    sob on exertation  . Sleep apnea    Past Surgical History:  Past Surgical History:  Procedure Laterality Date  . CARDIAC CATHETERIZATION    . CARDIAC CATHETERIZATION N/A 08/19/2016   Procedure: Right/Left Heart Cath and Coronary Angiography;  Surgeon: Jolaine Artist, MD;  Location: [facility];  Service: Cardiovascular;  Laterality: N/A;  . COLONOSCOPY WITH PROPOFOL N/A 06/16/2017   Procedure: COLONOSCOPY WITH PROPOFOL;  Surgeon: Mauri Pole, MD;  Location: [facility];  Service: Endoscopy;  Laterality: N/A;  . DIRECT LARYNGOSCOPY N/A 09/18/2016   Procedure: DIRECT LARYNGOSCOPY, BRONCHOSCOPY, REMOVAL OF INTUBATION GRANULOMA;  Surgeon: [name], MD;  Location: [facility] OR;  Service: ENT;  Laterality: N/A;  . DIRECT LARYNGOSCOPY N/A 10/20/2016   Procedure: EXTUBATION AND FLEXIBLE LARYNGOSCOPE;  Surgeon: [name], MD;  Location: [place];  Service: ENT;  Laterality: N/A;  . DIRECT LARYNGOSCOPY N/A 10/29/2016   Procedure: DIRECT  LARYNGOSCOPY;  Surgeon: [name], MD;  Location: [facility] OR;  Service: ENT;  Laterality: N/A;  . ESOPHAGOGASTRODUODENOSCOPY  12/23/2011   Procedure: ESOPHAGOGASTRODUODENOSCOPY (EGD);  Surgeon: [name], MD;  Location: [facility];  Service: Endoscopy;  Laterality: N/A;  . ESOPHAGOGASTRODUODENOSCOPY (EGD) WITH PROPOFOL N/A 06/16/2017   Procedure: ESOPHAGOGASTRODUODENOSCOPY (EGD) WITH PROPOFOL;  Surgeon: Mauri Pole, MD;  [facility];  Service: Endoscopy;  Laterality: N/A;  . EXTUBATION (ENDOTRACHEAL) IN OR N/A 07/21/2016   Procedure: EXTUBATION (ENDOTRACHEAL) IN OR;  Surgeon: [name], MD;  Location: [place];  Service: ENT;  Laterality: N/A;  . GIVENS CAPSULE STUDY  12/23/2011   Procedure: GIVENS CAPSULE STUDY;  Surgeon: [name], MD;  [facility];  Service: Endoscopy;  Laterality: N/A;  . MICROLARYNGOSCOPY WITH LASER N/A 10/16/2016   Procedure: MICRODIRECTLARYNGOSCOPY WITH LASER ABLATION AND KENLOG INJECTION;  Surgeon: [name], MD;  Location: [place];  Service: ENT;  Laterality: N/A;  . PACEMAKER INSERTION  1998   st jude, most recent gen change by [name] 4/12  . TRACHEOSTOMY TUBE PLACEMENT N/A 10/29/2016   Procedure: TRACHEOSTOMY;  Surgeon: [name], MD;  Location: [place];  Service: ENT;  Laterality: N/A;  . TUBAL LIGATION  04/01/2000   HPI:  Pt is a 73 y.o. female who presented with dyspnea and worsening expressive aphasia. PMH is significant for complete heart block and a fib s/p PPM 1998, COPD, polymyalgia rheumatica, HLD, HTN, Gout, GERD, CHF, CKD III, Anemia with AVMs of stomach, sleep apnea, tracheostomy, remote hx alcohol abuse, trach with [place]y Muir Speaking Valve since 2017, and GAD. CT of the head showed subtle new low-density area within the lower LEFT parietal lobe white matter, suspicious for acute to subacute infarct.   Assessment / Plan / Recommendation Clinical Impression  Pt reported that she currently resides with her son and daughter.  Per the pt, her daughter works but her son is home with her "most of the time". She denied any baseline deficits in speech, language or cognition but, per MD, her family has reported language deficits for approximately 1 month. The pt subsequently agreed that she did have language difficulty prior to admission but indicated it is now worse. The pt indicated that she is now having more difficulty with verbal expression but denied any motor speech or cognitive deficits. She presents mild to moderate non-fluent aphasia characterized by deficts in receptive and expressive language with more pronounced difficulty with verbal expression. She was able to follow 2-step commands and answer simple yes/no questions but she exhibited difficulty with 3-step commands, complex yes/no questions, and paragraph-level material. Continued skilled SLP serviced are clinically indicated at this time to remediate aphasia.     SLP Assessment  SLP Recommendation/Assessment: Patient needs continued Speech Lanaguage Pathology Services SLP Visit Diagnosis: Aphasia (R47.01)    Follow Up Recommendations  Inpatient Rehab    Frequency and Duration min 2x/week  2 weeks      SLP Evaluation Cognition  Overall Cognitive Status: Difficult to assess(Due to aphasia) Arousal/Alertness: Awake/alert  Attention: Focused;Sustained Focused Attention: Appears intact Sustained Attention: Appears intact Awareness: Appears intact       Comprehension  Auditory Comprehension Overall Auditory Comprehension: Impaired Yes/No Questions: Impaired Basic Biographical Questions: (5/5) Complex Questions: (3/5) Paragraph Comprehension (via yes/no questions): (2/4) Commands: Impaired Two Step Basic Commands: (4/4) Multistep Basic Commands: (0/4) Reading Comprehension Reading Status: Not tested    Expression Expression Primary Mode of Expression: Verbal Verbal Expression Overall Verbal Expression: Impaired Initiation: Impaired Automatic  Speech: Counting;Day of week;Month of year(Counting: 2/10; Days: 6/7; Months: 3/12) Level of Generative/Spontaneous Verbalization: Sentence Repetition: Impaired Level of Impairment: Sentence level(1/5) Naming: Impairment Responsive: (4/5) Confrontation: Impaired(4/10) Convergent: (Sentence completion: 3/5) Divergent: Not tested Verbal Errors: Phonemic paraphasias Written Expression Dominant Hand: Right   Oral / Motor  Oral Motor/Sensory Function Overall Oral Motor/Sensory Function: Within functional limits Motor Speech Overall Motor Speech: Appears within functional limits for tasks assessed Respiration: Within functional limits Phonation: Normal Resonance: Within functional limits Articulation: Within functional limitis Intelligibility: Intelligible Motor Planning: Witnin functional limits Motor Speech Errors: Not applicable   Colbi Schiltz I. Hardin Negus, [place], [place] Office number [phone] Pager [phone]                   Horton Marshall 05/09/2019, 12:49 PM

## 2019-05-09 NOTE — Progress Notes (Addendum)
CALL PAGER [phone] for any questions or notifications regarding this patient   FMTS Attending Daily Note: [name] Singh, MD  Pager [phone]  Office [phone] I have seen and examined this patient, reviewed their chart. I have discussed this patient with the resident. I agree with the resident's findings, assessment and care plan. - Addendum to below- discontinue aspirin as started apixaban - Consider ramelteon at night  [name] today  - Pacemaker requires interrogation   Family Medicine Teaching Service Daily Progress Note Intern Pager: [phone]  Patient name: [name] Medical record number: [id number] Date of birth: June 06, 1946 Age: 73 y.o. Gender: female  Primary Care Provider: Steve Rattler, DO Consultants: neurology Code Status: FULL  Pt Overview and Major Events to Date:  5/24- Admitted for aphasia, found to have new stroke on CT in lower left parietal area   5/25- Discussed with GI, Family and Neurology -> Started anticoagulation for known atrial fibrillation (history of gastric AVM)   Assessment and Plan: [name] a 74 y.o.femalepresenting with dyspnea and worsening expressive aphasia. PMH is significant forcomplete heart block and a fib s/p PPM 1998, COPD, polymyalgia rheumatica, HLD, HTN, Gout, GERD, CHF, CKD III, Anemia with AVMs of stomach, sleep apnea, tracheostomy, remote hx alcohol abuse, and GAD.   Expressive Aphasia  Still having occasional word finding difficulty. First noted 3/27per chart review. Seen by neurologist, Dr, [name], on 4/2, who performed CTA head and neck that showed no significant intracranial or extracranial arterial stenosis. Told to continue ASA 325mg  QD and continue Crestor 20mg  QD with LDL goal <70. CT head on admission with new low-density area within lower left parietal lobe suspicious for acute to subacute infarct, cannot get MRI d/t pacemaker.  GI says okay to start [facility].  - neuro following; appreciate  recs - cont crestor 20mg  QD - started eliquis 5 mg BID  - monitor on telemetry - PT/OT/SLP   Shortness of Breath Likely 2/2 trach mucus.  Stable chronic chest pain.  CT chest wnl, no pneumonia on pulm edea on CXR. - trach care per RT - monitor resp status - could consider V/Q scan if SOB does not improve with suctioning of trach  Hypokalemia K 3.4 this AM. - K-Dur 56mEq x1 - AM BMP  Tracheostomy Secondary to OSA and laryngeal stenosis, seen by ENT at [facility], most recently 04/18/2019.Clean and in place on exam. - trach care per RT  Atrial Fibrillation and complete heart block s/p PPM 1998 Follows with cardiology. Spoke with Dr [name] with GI who states it's okay to start her back on DOAC in the context of afib and time since previous bleed. CHADS2-VASC 5, HAS-BLED 3, high risk of major bleed.Last pacer check on 09/2018. WNL.EKG on admission with pacing spikes, no clear P waves, no changes from previous EKGs. On metoprolol q6h prn at home.  - restarted eliquis.  - cardiac telemetry - holding metoprolol pending med rec as is prn - pantoprazole 40mg  daily   HFpEF LVEF > 65% on echo.  severy increased LV thickness. Hypertrophic cardiomyopathy. On Lasix 40mg  BID and metoprolol 25mg  q6h prn palpitations. CXR without acute changes. Weight incrased by 5 lbs over past two days.   - cont Lasix 40mg  BID - daily weights - strict I/O  Acute onCKD Stage III: Resolved GFR 29 on admission, stage IV range. Cr 1.94 on admission, baseline appears ~1.7. Today 1.56. Euvolemic on exam. No ACE/ARB. Does take lasix 40mg  BID.  - monitor Cr - BMP  Polymyalgia rheumatica:  Chronic, stable On prednisone taper at home, but unclear dosing. Per chart review, patient sees [name], [place] at [facility]. No dose found in chart recently. Note from 11/26/2018 stated patient was on 7mg  QD at that time. Also notes stating that she ws on taper.  -will need to vierfy  dosing with rheumatologist - would opt to continue pred at 7mg  as do not want to give lower dose than she is on and this is last confirmed dose in chart  HTN: Chronic, stable BP 138/81 on admission. Normotensive today.Takes metoprolol prn at home. Pending med rec. - Hold prn metop   HLD On Crestor 20mg  QD at home. Last lipid panel 05/2018 with LDL 106, goal LDL <70.  - cont statin per above  Gout: Chronic, stable Takes allopurinol daily. No acute gouty flare. - cont allopurinol  Anemia of Chronic Disease: Stable Also has history of Gastric AVMs noted in chart. Receives fereheme from Dr. Carolin [name], most recently 5/5, also appears to be scheduled for injection 5/26. at baseline.  - feraheme today - monitor CBC  GAD Xanax 0.5 mg prn qd. Has beentakingmore recently, up to BID. - cont xanax qd prn - consider avoiding on discharge given age  FEN/GI: Heart healthy, thin  Prophylaxis:Apixaban   Disposition: home w/ HHPT  Subjective:  Patient states she has a lot of mucous in her throat, especially in the morning.  She says she is constipated and has trouble having a BM when she tries to go to the bathroom.  She wanted to know if they plan on taking her trach out while she's here.    Objective: Temp:  [97.7 F (36.5 C)-98.7 F (37.1 C)] 98.7 F (37.1 C) (05/26 1541) Pulse Rate:  [68-73] 70 (05/26 1541) Resp:  [10-20] 16 (05/26 1541) BP: (110-124)/(63-80) 124/80 (05/26 1541) SpO2:  [96 %-100 %] 99 % (05/26 1541) Weight:  [80.2 kg] 80.2 kg (05/26 0500) Physical Exam: General: alert and oriented. Having some word finding difficulty.   Cardiovascular: regular rhythm. Normal rate.  No murmur.   Respiratory: LCTAB except for course upper airway sounds.  Wet cough.  Tracheostomy site clean, dry, improved from yesterday  Abdomen: soft, mild TTP on the left side. NBS.    Laboratory: Recent Labs  Lab 05/08/19 1424 05/09/19 0402 05/10/19 0402  WBC 6.1 3.5* 3.6*   HGB 10.0* 9.7* 9.9*  HCT 32.5* 30.7* 31.1*  PLT 207 211 198   Recent Labs  Lab 05/08/19 1424 05/09/19 0402 05/10/19 0402  NA 139 141 139  K 3.7 3.3* 3.4*  CL 102 102 99  CO2 26 30 28   BUN 41* 28* 30*  CREATININE 1.94* 1.51* 1.56*  CALCIUM 9.8 9.7 10.1  PROT 6.1*  --   --   BILITOT 0.5  --   --   ALKPHOS 48  --   --   ALT 10  --   --   AST 22  --   --   GLUCOSE 125* 111* 118*    Martyn Malay, MD 05/10/2019, 4:25 PM PGY-1, [place] Intern pager: [phone], text pages welcome

## 2019-05-10 ENCOUNTER — Encounter (HOSPITAL_COMMUNITY): Payer: Self-pay

## 2019-05-10 ENCOUNTER — Inpatient Hospital Stay (HOSPITAL_COMMUNITY): Admission: RE | Admit: 2019-05-10 | Payer: Medicare Other | Source: Ambulatory Visit

## 2019-05-10 DIAGNOSIS — I4891 Unspecified atrial fibrillation: Secondary | ICD-10-CM

## 2019-05-10 DIAGNOSIS — Z8719 Personal history of other diseases of the digestive system: Secondary | ICD-10-CM

## 2019-05-10 LAB — BASIC METABOLIC PANEL
Anion gap: 12 (ref 5–15)
BUN: 30 mg/dL — ABNORMAL HIGH (ref 8–23)
CO2: 28 mmol/L (ref 22–32)
Calcium: 10.1 mg/dL (ref 8.9–10.3)
Chloride: 99 mmol/L (ref 98–111)
Creatinine, Ser: 1.56 mg/dL — ABNORMAL HIGH (ref 0.44–1.00)
GFR calc Af Amer: 37 mL/min — ABNORMAL LOW (ref >60)
GFR calc non Af Amer: 32 mL/min — ABNORMAL LOW (ref >60)
Glucose, Bld: 118 mg/dL — ABNORMAL HIGH (ref 70–99)
Potassium: 3.4 mmol/L — ABNORMAL LOW (ref 3.5–5.1)
Sodium: 139 mmol/L (ref 135–145)

## 2019-05-10 LAB — CBC
HCT: 31.1 % — ABNORMAL LOW (ref 36.0–46.0)
Hemoglobin: 9.9 g/dL — ABNORMAL LOW (ref 12.0–15.0)
MCH: 29.9 pg (ref 26.0–34.0)
MCHC: 31.8 g/dL (ref 30.0–36.0)
MCV: 94 fL (ref 80.0–100.0)
Platelets: 198 10*3/uL (ref 150–400)
RBC: 3.31 MIL/uL — ABNORMAL LOW (ref 3.87–5.11)
RDW: 14.9 % (ref 11.5–15.5)
WBC: 3.6 10*3/uL — ABNORMAL LOW (ref 4.0–10.5)
nRBC: 0 % (ref 0.0–0.2)

## 2019-05-10 MED ORDER — APIXABAN 5 MG PO TABS
5.0000 mg | ORAL_TABLET | Freq: Once | ORAL | Status: AC
  Administered 2019-05-10: 5 mg via ORAL
  Filled 2019-05-10: qty 1

## 2019-05-10 MED ORDER — POTASSIUM CHLORIDE CRYS ER 20 MEQ PO TBCR
40.0000 meq | EXTENDED_RELEASE_TABLET | Freq: Once | ORAL | Status: AC
  Administered 2019-05-10: 40 meq via ORAL
  Filled 2019-05-10: qty 2

## 2019-05-10 MED ORDER — APIXABAN 5 MG PO TABS
5.0000 mg | ORAL_TABLET | Freq: Two times a day (BID) | ORAL | Status: DC
  Administered 2019-05-11: 5 mg via ORAL
  Filled 2019-05-10: qty 1

## 2019-05-10 MED ORDER — SODIUM CHLORIDE 0.9 % IV SOLN
510.0000 mg | Freq: Once | INTRAVENOUS | Status: AC
  Administered 2019-05-10: 510 mg via INTRAVENOUS
  Filled 2019-05-10: qty 17

## 2019-05-10 MED ORDER — APIXABAN 5 MG PO TABS: 5.0000 mg | ORAL_TABLET | Freq: Two times a day (BID) | ORAL | Status: DC

## 2019-05-10 NOTE — TOC Initial Note (Signed)
Transition of Care [facility]) - Initial/Assessment Note    Patient Details  Name: [name] MRN: [id number] Date of Birth: 1946-03-07  [facility] CM/SW Contact:    Pollie Friar, RN Phone Number: 05/10/2019, 3:27 PM  Clinical Narrative:                 Recommendations are for CIR. Pt and family are refusing and want home with [facility] services. Daughter feels patient has been active with [facility] recently. Awaiting MD orders and timing of d/c. CM following.  Expected Discharge Plan: IP Rehab Facility Barriers to Discharge: No Barriers Identified   Patient Goals and CMS Choice Patient states their goals for this hospitalization and ongoing recovery are:: to get home CMS Medicare.gov Compare Post Acute Care list provided to:: Patient Represenative (must comment) Choice offered to / list presented to : Adult Children(daughter)  Expected Discharge Plan and Services Expected Discharge Plan: [place]   Discharge Planning Services: CM Consult Post Acute Care Choice: [place] arrangements for the past 2 months: Single Family Home                           HH Arranged: PT, OT, Speech Therapy HH Agency: [facility]        Prior Living Arrangements/Services Living arrangements for the past 2 months: Single Family Home Lives with:: Adult Children(son and daughter) Patient language and need for interpreter reviewed:: Yes(no needs) Do you feel safe going back to the place where you live?: Yes      Need for Family Participation in Patient Care: Yes (Comment)(24 hour supervision) Care giver support system in place?: Yes (comment)(son and daughter provide 24 hour supervison) Current home services: DME(suction, rollator, cane, walker, wheelchair, 3 in 1) Criminal Activity/Legal Involvement Pertinent to Current Situation/Hospitalization: No - Comment as needed  Activities of Daily Living Home Assistive Devices/Equipment: None ADL  Screening (condition at time of admission) Patient's cognitive ability adequate to safely complete daily activities?: Yes Is the patient deaf or have difficulty hearing?: No Does the patient have difficulty seeing, even when wearing glasses/contacts?: No Does the patient have difficulty concentrating, remembering, or making decisions?: No Patient able to express need for assistance with ADLs?: Yes Does the patient have difficulty dressing or bathing?: No Independently performs ADLs?: Yes (appropriate for developmental age) Does the patient have difficulty walking or climbing stairs?: No Weakness of Legs: None Weakness of Arms/Hands: None  Permission Sought/Granted                  Emotional Assessment Appearance:: Appears stated age Attitude/Demeanor/Rapport: Engaged Affect (typically observed): Accepting, Appropriate, Pleasant Orientation: : Oriented to Self, Oriented to Place, Oriented to  Time, Oriented to Situation   Psych Involvement: No (comment)  Admission diagnosis:  Aphasia [R47.01] Dyspnea, unspecified type [R06.00] Patient Active Problem List   Diagnosis Date Noted  . Cerebral embolism with cerebral infarction 05/09/2019  . Expressive aphasia 05/08/2019  . Other abnormal glucose 11/26/2018  . Polypharmacy 06/25/2018  . Depression with anxiety   . Tracheostomy in place [facility])   . Tracheostomy care ([place])   . Calculus of gallbladder without cholecystitis without obstruction 12/24/2017  . Chronic cough 09/25/2017  . History of colonic polyps   . Moderate protein-calorie malnutrition ([place]) 06/02/2017  . Chronic idiopathic constipation 03/19/2017  . 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 Permanent atrial fibrillation   . Laryngeal stenosis 09/18/2016  . Esophageal dysphagia   .  Polymyalgia rheumatica ([place]) 04/10/2015  . Hypertrophic cardiomyopathy ([place]) 02/15/2015  . Eczema 09/18/2014  . Environmental allergies 09/18/2014  . Pre-diabetes 11/26/2013  . COPD, moderate  ([place]) 11/01/2013  . Complete heart block ([place]) 04/03/2011  . Anemia 02/17/2011  . AVM (arteriovenous malformation) 02/13/2011  . PACEMAKER-St.Jude 11/28/2010  . CKD (chronic kidney disease) stage 3, GFR 30-59 ml/min (HCC) 09/24/2010  . Acute on chronic congestive heart failure ([place]) 08/30/2010  . ARTHRITIS 07/24/2010  . Generalized anxiety disorder 07/23/2010  . Hyperlipidemia 02/11/2007  . Essential hypertension 02/11/2007  . APNEA, SLEEP 02/11/2007   PCP:  Steve Rattler, DO Pharmacy:   [facility] [place] [address] [place] [address] Phone: [phone] Fax: [phone]     Social Determinants of Health (SDOH) Interventions    Readmission Risk Interventions No flowsheet data found.

## 2019-05-10 NOTE — Progress Notes (Signed)
Physical Therapy Treatment Patient Details Name: [name] MRN: [id number] DOB: 1946/09/13 Today's Date: 05/10/2019    History of Present Illness Pt is a 73 y/o female who presents with dyspnea and worsening expressive aphasia. PMH significant for complete heart block and a-fib s/p PPM 1998, COPD, polymyalgia rheumatica, HTN, Gout, CHF, CKD III, Anemia with AVMs of stomach, tracheostomy. CT revealed new low-density area within the lower left-parietal lobe suspicious for acute to subacute infarct.     PT Comments    Discussed updated discharge recommendations per pt/family request and the plan is now for return home with family support. Recommend 24 hour supervision initially with HHPT to follow-up. Today, pt with difficulty following multi-step commands and we were not able to complete the DGI because of this. Tolerance for functional activity appears improved and pt was able to ambulate ~200' without an AD.    Follow Up Recommendations  Home health PT;Supervision/Assistance - 24 hour     Equipment Recommendations  None recommended by PT    Recommendations for Other Services       Precautions / Restrictions Precautions Precautions: Fall Precaution Comments: Chronic trach with PMV safety-pinned to gown Restrictions Weight Bearing Restrictions: No    Mobility  Bed Mobility Overal bed mobility: Needs Assistance Bed Mobility: Supine to Sit     Supine to sit: Supervision     General bed mobility comments: Close supervision for safety as pt transitions to EOB.   Transfers Overall transfer level: Needs assistance Equipment used: None Transfers: Sit to/from Stand Sit to Stand: Min guard;Supervision         General transfer comment: Close supervision to light min guard for safety and balance. Pt required increased time to gain/maintain balance prior to initiating gait training.   Ambulation/Gait Ambulation/Gait assistance: Min guard Gait Distance (Feet): 200  Feet Assistive device: 1 person hand held assist;None Gait Pattern/deviations: Step-through pattern;Decreased stride length;Trunk flexed Gait velocity: Decreased Gait velocity interpretation: <1.8 ft/sec, indicate of risk for recurrent falls General Gait Details: Pt declined RW use. No assist required during ambulation, however when cognitive task added in pt required occasional HHA for balance support and safety.    Stairs             Wheelchair Mobility    Modified Rankin (Stroke Patients Only) Modified Rankin (Stroke Patients Only) Pre-Morbid Rankin Score: Slight disability Modified Rankin: Moderately severe disability     Balance Overall balance assessment: Needs assistance Sitting-balance support: Feet supported;No upper extremity supported Sitting balance-Leahy Scale: Fair     Standing balance support: No upper extremity supported;During functional activity Standing balance-Leahy Scale: Poor Standing balance comment: Requires at least 1 HHA              High level balance activites: Backward walking;Direction changes;Turns;Sudden stops;Head turns High Level Balance Comments: Pt unable to follow multiple step commands. Attempted DGI components however pt not able to complete tasks even after multiple explanation attempts and demonstration. Occasional min assist during backwards walking and head turns.            Cognition Arousal/Alertness: Awake/alert Behavior During Therapy: WFL for tasks assessed/performed Overall Cognitive Status: Difficult to assess(Due to aphasia)                                 General Comments: Oriented, but expressive aphasia is limiting full cognitive evaluation      Exercises      General Comments  Pertinent Vitals/Pain Pain Assessment: No/denies pain Faces Pain Scale: No hurt    Home Living                      Prior Function            PT Goals (current goals can now be found in  the care plan section) Acute Rehab PT Goals Patient Stated Goal: Return home with children PT Goal Formulation: With patient Time For Goal Achievement: 05/23/19 Potential to Achieve Goals: Good Progress towards PT goals: Progressing toward goals    Frequency    Min 4X/week      PT Plan Discharge plan needs to be updated    Co-evaluation              AM-PAC PT "6 Clicks" Mobility   Outcome Measure  Help needed turning from your back to your side while in a flat bed without using bedrails?: None Help needed moving from lying on your back to sitting on the side of a flat bed without using bedrails?: None Help needed moving to and from a bed to a chair (including a wheelchair)?: A Little Help needed standing up from a chair using your arms (e.g., wheelchair or bedside chair)?: A Little Help needed to walk in hospital room?: A Little Help needed climbing 3-5 steps with a railing? : A Little 6 Click Score: 20    End of Session Equipment Utilized During Treatment: Gait belt Activity Tolerance: Patient tolerated treatment well Patient left: in chair;with call bell/phone within reach;with chair alarm set Nurse Communication: Mobility status PT Visit Diagnosis: Unsteadiness on feet (R26.81);Other symptoms and signs involving the nervous system (R29.898)     Time: 1011-1040 PT Time Calculation (min) (ACUTE ONLY): 29 min  Charges:  $Gait Training: 23-37 mins                     Rolinda Roan, PT, DPT Acute Rehabilitation Services Pager: [phone] Office: [phone]    Thelma Comp 05/10/2019, 1:04 PM

## 2019-05-10 NOTE — Progress Notes (Signed)
Inpatient Rehab Admissions:  Inpatient Rehab Consult received.  I met with pt at the bedside for rehabilitation assessment. Pt is progressing very well, ambulating without an AD today. Noted updated recommendations by PT for [facility] therapy. AC in agreement with HH at this time. AC spoke with pt and her daughter who verbalize understanding of recommendation. Per daughter and pt, she will have 24/7 supervision at home. CM/SW aware.  AC will sign off.   Jhonnie Garner, OTR/L  Rehab Admissions Coordinator  [phone] 05/10/2019 1:44 PM

## 2019-05-10 NOTE — Progress Notes (Signed)
  Speech Language Pathology Treatment: Cognitive-Linquistic(Aphasia)  Patient Details Name: [name] MRN: [id number] DOB: [birth year] Today's Date: 05/10/2019 Time: 3716-9678 SLP Time Calculation (min) (ACUTE ONLY): 26 min  Assessment / Plan / Recommendation Clinical Impression  Pt reported that she feels that her speech is "going and coming" but stated that it is slightly improved compared to yesterday.  She demonstrated 70% accuracy with confrontational naming with self-correction increasing to 90% accuracy with min-mod cues. She achieved 100% accuracy with responsive naming of higher-frequency and 40% accuracy with lower-frequency words increasing to 100% accuracy with mod cues. She demonstrated 60% accuracy with sentence completion increasing to 100% accuracy with phonemic cues. The session was terminated prematurely due to the pt needing to perform trach care. SLP will continue to follow pt.    HPI HPI: Pt is a 73 y.o. female who presented with dyspnea and worsening expressive aphasia. PMH is significant for complete heart block and a fib s/p PPM 1998, COPD, polymyalgia rheumatica, HLD, HTN, Gout, GERD, CHF, CKD III, Anemia with AVMs of stomach, sleep apnea, tracheostomy, remote hx alcohol abuse, trach with [place] Speaking Valve since 2017, and GAD. CT of the head showed subtle new low-density area within the lower LEFT parietal lobe white matter, suspicious for acute to subacute infarct.      SLP Plan  Continue with current plan of care       Recommendations                   Follow up Recommendations: Inpatient Rehab SLP Visit Diagnosis: Aphasia (R47.01) Plan: Continue with current plan of care       Akoni Parton I. Hardin Negus, [place], [place] Office number [phone] Pager [phone]                 Horton Marshall 05/10/2019, 11:28 AM

## 2019-05-10 NOTE — Progress Notes (Signed)
Visit made to patients room to do trach check, trach looks fine and in tach patient states she would like for me to suction, I suctioned and didn't retrieve any secretions.  Patient keeps PMV in use most of the time, I advised she needs to remove this while sleeping and wear aerosol trach collar for humidification to help with moisture and to help generate secretions.. Patient has a good cough it is just dry.  I set up 21% ATC and advised patient how it works Social research officer, government.

## 2019-05-11 ENCOUNTER — Other Ambulatory Visit: Payer: Self-pay

## 2019-05-11 DIAGNOSIS — I6349 Cerebral infarction due to embolism of other cerebral artery: Secondary | ICD-10-CM

## 2019-05-11 LAB — CBC WITH DIFFERENTIAL/PLATELET
Abs Immature Granulocytes: 0.02 10*3/uL (ref 0.00–0.07)
Basophils Absolute: 0 10*3/uL (ref 0.0–0.1)
Basophils Relative: 1 %
Eosinophils Absolute: 0.1 10*3/uL (ref 0.0–0.5)
Eosinophils Relative: 2 %
HCT: 31 % — ABNORMAL LOW (ref 36.0–46.0)
Hemoglobin: 10 g/dL — ABNORMAL LOW (ref 12.0–15.0)
Immature Granulocytes: 1 %
Lymphocytes Relative: 30 %
Lymphs Abs: 1.2 10*3/uL (ref 0.7–4.0)
MCH: 30.3 pg (ref 26.0–34.0)
MCHC: 32.3 g/dL (ref 30.0–36.0)
MCV: 93.9 fL (ref 80.0–100.0)
Monocytes Absolute: 0.4 10*3/uL (ref 0.1–1.0)
Monocytes Relative: 11 %
Neutro Abs: 2.2 10*3/uL (ref 1.7–7.7)
Neutrophils Relative %: 55 %
Platelets: 213 10*3/uL (ref 150–400)
RBC: 3.3 MIL/uL — ABNORMAL LOW (ref 3.87–5.11)
RDW: 14.8 % (ref 11.5–15.5)
WBC: 4 10*3/uL (ref 4.0–10.5)
nRBC: 0 % (ref 0.0–0.2)

## 2019-05-11 LAB — BASIC METABOLIC PANEL
Anion gap: 13 (ref 5–15)
BUN: 38 mg/dL — ABNORMAL HIGH (ref 8–23)
CO2: 27 mmol/L (ref 22–32)
Calcium: 10.2 mg/dL (ref 8.9–10.3)
Chloride: 98 mmol/L (ref 98–111)
Creatinine, Ser: 1.82 mg/dL — ABNORMAL HIGH (ref 0.44–1.00)
GFR calc Af Amer: 31 mL/min — ABNORMAL LOW (ref >60)
GFR calc non Af Amer: 27 mL/min — ABNORMAL LOW (ref >60)
Glucose, Bld: 99 mg/dL (ref 70–99)
Potassium: 3.5 mmol/L (ref 3.5–5.1)
Sodium: 138 mmol/L (ref 135–145)

## 2019-05-11 MED ORDER — PREDNISONE 2.5 MG PO TABS: 7.5000 mg | ORAL_TABLET | Freq: Every day | ORAL | 0 refills | Status: DC

## 2019-05-11 MED ORDER — APIXABAN 5 MG PO TABS: 5.0000 mg | ORAL_TABLET | Freq: Two times a day (BID) | ORAL | 0 refills | Status: DC

## 2019-05-11 MED ORDER — POTASSIUM CHLORIDE CRYS ER 20 MEQ PO TBCR
40.0000 meq | EXTENDED_RELEASE_TABLET | Freq: Once | ORAL | Status: AC
  Administered 2019-05-11: 40 meq via ORAL
  Filled 2019-05-11: qty 2

## 2019-05-11 NOTE — Patient Outreach (Signed)
[facility]) Care Management  05/11/2019  Charon Smedberg 26-Sep-1946 [id number]  TELEPHONE SCREENING Referral date: 04/27/19 Referral source: utilization management Referral reason: Resources for patient Insurance: [employer] care   Telephone call to patient's son/ designated party release, Ariya Bohannon.  HIPAA verified by son for patient.  Patients daughter/ designated party release, Lossie Kalp on the phone with son at time of call. Son states patient was admitted to the hospital on 05/08/19 due to a stroke. Son states patient is still in the hospital but will possibly be discharged on today. Son states patient has some speech difficulty from the stroke and will be discharged home with [facility].  Daughter states due to [place] [address] the family was not able to be with the patient at bedside. Daughter states patient has only the speech deficit from the stroke that she is aware of.  RNCM discussed [facility] care management services. Provided contact phone number for [facility] and 24 hour nurse advise line. Advised son and daughter to contact [facility] care management if additional services are needed. Daughter and son voiced understanding and appreciation of call. Daughter confirmed patients mailing address. Agreed to [facility] mailing [facility] care management brochure and magnet to patient.   PLAN: RNCM will close due to patient being assessed and having no further needs at this time.    Quinn Plowman RN,BSN,CCM [facility] Telephonic  [phone]

## 2019-05-11 NOTE — TOC Benefit Eligibility Note (Signed)
Transition of Care [facility]) Benefit Eligibility Note    Patient Details  Name: Kennidy Lamke MRN: [id number] Date of Birth: 1946/09/02   Medication/Dose: Arne Cleveland  5 MG  Covered?: Yes     Prescription Coverage Preferred Pharmacy: YES([facility] AND  [facility]  M/O. 90 DAY SUPPLY FOR M/O $8.95)  Spoke with Person/Company/Phone Number:: [employer]([facility] RX # [phone])  Co-Pay: $8.95  Prior Approval: No     Additional Notes: SECONDARY INS : MEDICAID OF [place] , CO-PAY- $3.90 FOR EACH PRESCRIPTION    Memory Argue Phone Number: 05/11/2019, 10:17 AM

## 2019-05-11 NOTE — Progress Notes (Signed)
Patient discharged with both bags of home medication from pharmacy, forms signed on chart.

## 2019-05-11 NOTE — Plan of Care (Signed)
Adequate for discharge.

## 2019-05-11 NOTE — Discharge Instructions (Signed)
Please continue to take the medications listed below as prescribed.  Your follow-up appointment with the family medicine Center on June 1 please come to this appointment 15 minutes early.  We have referred you for home health speech therapy and physical therapy.  You will be getting a call from them soon.  We started you back on Eliquis, which can help prevent clots that lead to things like stroke.  Even though you had some stomach bleeding in 2012 it has been largely stable since that time and you have not had any more bleeding episodes.  The the benefits of taking Eliquis outweigh the risks of bleeding associated with it.  However if you do notice nosebleeding, blood in your urine, bright red blood in your stool or black tarry stool please call your doctor and let them know.  If you start to this worsening in your speech or more difficulty speaking, or if you have difficulty walking or weakness in any of your arms or legs please come back to the emergency department to get reevaluated for possible stroke.    Apixaban oral tablets What is this medicine? APIXABAN (a PIX a ban) is an anticoagulant (blood thinner). It is used to lower the chance of stroke in people with a medical condition called atrial fibrillation. It is also used to treat or prevent blood clots in the lungs or in the veins. This medicine may be used for other purposes; ask your health care provider or pharmacist if you have questions. COMMON BRAND NAME(S): Eliquis What should I tell my health care provider before I take this medicine? They need to know if you have any of these conditions: -bleeding disorders -bleeding in the brain -blood in your stools (black or tarry stools) or if you have blood in your vomit -history of stomach bleeding -kidney disease -liver disease -mechanical heart valve -an unusual or allergic reaction to apixaban, other medicines, foods, dyes, or preservatives -pregnant or trying to get  pregnant -breast-feeding How should I use this medicine? Take this medicine by mouth with a glass of water. Follow the directions on the prescription label. You can take it with or without food. If it upsets your stomach, take it with food. Take your medicine at regular intervals. Do not take it more often than directed. Do not stop taking except on your doctor's advice. Stopping this medicine may increase your risk of a blood clot. Be sure to refill your prescription before you run out of medicine. Talk to your pediatrician regarding the use of this medicine in children. Special care may be needed. Overdosage: If you think you have taken too much of this medicine contact a poison control center or emergency room at once. NOTE: This medicine is only for you. Do not share this medicine with others. What if I miss a dose? If you miss a dose, take it as soon as you can. If it is almost time for your next dose, take only that dose. Do not take double or extra doses. What may interact with this medicine? This medicine may interact with the following: -aspirin and aspirin-like medicines -certain medicines for fungal infections like ketoconazole and itraconazole -certain medicines for seizures like carbamazepine and phenytoin -certain medicines that treat or prevent blood clots like warfarin, enoxaparin, and dalteparin -clarithromycin -NSAIDs, medicines for pain and inflammation, like ibuprofen or naproxen -rifampin -ritonavir -St. John's wort This list may not describe all possible interactions. Give your health care provider a list of all the medicines, herbs,  non-prescription drugs, or dietary supplements you use. Also tell them if you smoke, drink alcohol, or use illegal drugs. Some items may interact with your medicine. What should I watch for while using this medicine? Visit your healthcare professional for regular checks on your progress. You may need blood work done while you are taking this  medicine. Your condition will be monitored carefully while you are receiving this medicine. It is important not to miss any appointments. Avoid sports and activities that might cause injury while you are using this medicine. Severe falls or injuries can cause unseen bleeding. Be careful when using sharp tools or knives. Consider using an Copy. Take special care brushing or flossing your teeth. Report any injuries, bruising, or red spots on the skin to your healthcare professional. If you are going to need surgery or other procedure, tell your healthcare professional that you are taking this medicine. Wear a medical ID bracelet or chain. Carry a card that describes your disease and details of your medicine and dosage times. What side effects may I notice from receiving this medicine? Side effects that you should report to your doctor or health care professional as soon as possible: -allergic reactions like skin rash, itching or hives, swelling of the face, lips, or tongue -signs and symptoms of bleeding such as bloody or black, tarry stools; red or dark-brown urine; spitting up blood or brown material that looks like coffee grounds; red spots on the skin; unusual bruising or bleeding from the eye, gums, or nose -signs and symptoms of a blood clot such as chest pain; shortness of breath; pain, swelling, or warmth in the leg -signs and symptoms of a stroke such as changes in vision; confusion; trouble speaking or understanding; severe headaches; sudden numbness or weakness of the face, arm or leg; trouble walking; dizziness; loss of coordination This list may not describe all possible side effects. Call your doctor for medical advice about side effects. You may report side effects to FDA at 1-800-FDA-1088. Where should I keep my medicine? Keep out of the reach of children. Store at room temperature between 20 and 25 degrees C (68 and 77 degrees F). Throw away any unused medicine after the  expiration date. NOTE: This sheet is a summary. It may not cover all possible information. If you have questions about this medicine, talk to your doctor, pharmacist, or health care provider.  2019 Elsevier/Gold Standard (2017-11-26 11:20:07)

## 2019-05-11 NOTE — Progress Notes (Addendum)
Family Medicine Teaching Service Daily Progress Note Intern Pager: [phone]  Patient name: [name] Medical record number: [id number] Date of birth: [birth year] Age: 73 y.o. Gender: female  Primary Care Provider: Steve Rattler, DO Consultants: neurology Code Status: FULL  Pt Overview and Major Events to Date:  5/24- Admitted for aphasia, found to have new stroke on CT in lower left parietal area   5/25- Discussed with GI, Family and Neurology -> Started anticoagulation for known atrial fibrillation (history of gastric AVM)   Assessment and Plan: [name] a 73 y.o.femalepresenting with dyspnea and worsening expressive aphasia. PMH is significant forcomplete heart block and a fib s/p PPM 1998, COPD, polymyalgia rheumatica, HLD, HTN, Gout, GERD, CHF, CKD III, Anemia with AVMs of stomach, sleep apnea, tracheostomy, remote hx alcohol abuse, and GAD.   Expressive Aphasia  Aphasia still present but appears improved from yesterday. Minimal word finding difficulty. First noted 3/27per chart review. Seen by neurologist, Dr, [name], on 4/2, who performed CTA head and neck that showed no significant intracranial or extracranial arterial stenosis. Told to continue ASA 325mg  QD and continue Crestor 20mg  QD with LDL goal <70. CT head on admission with new low-density area within lower left parietal lobe suspicious for acute to subacute infarct, cannot get MRI d/t pacemaker.   - neuro following; appreciate recs - cont crestor 20mg  QD - started eliquis 5 mg BID  - monitor on telemetry - PT/OT/SLP  Shortness of Breath Likely 2/2 trach mucus.  100 % on room air.  Could be more psychological SOB than physiological. Stable chronic chest pain.  CT chest wnl, no pneumonia on pulm edea on CXR. - trach care per RT - monitor resp status - encourage pt to take out PMV when not using it to speak.   Hypokalemia K 3.4 this AM. - K-Dur 49mEq x1 - AM  BMP  Tracheostomy Secondary to OSA and laryngeal stenosis, seen by ENT at [facility], most recently 04/18/2019.Clean and in place on exam. - trach care per RT  Atrial Fibrillation and complete heart block s/p PPM 1998 Follows with cardiology. Spoke with Dr Benson Norway with GI who states it's okay to start her back on DOAC in the context of afib and time since previous bleed. CHADS2-VASC 5, HAS-BLED 3, high risk of major bleed.Last pacer check on 09/2018. WNL.EKG on admission with pacing spikes, no clear P waves, no changes from previous EKGs. On metoprolol q6h prn at home.  - pacemaker requires interrogation - restarted eliquis.  - cardiac telemetry - holding metoprolol pending med rec as is prn - pantoprazole 40mg  daily   HFpEF LVEF > 65% on echo.  severy increased LV thickness. Hypertrophic cardiomyopathy. On Lasix 40mg  BID and metoprolol 25mg  q6h prn palpitations. CXR without acute changes. Weight incrased from 172 to 179 lbs since admission  - cont Lasix 40mg  BID - daily weights - strict I/O  Acute onCKD Stage III: Resolved GFR 29 on admission, stage IV range. Cr 1.94 on admission, baseline appears ~1.7. Today 1.56. Euvolemic on exam. No ACE/ARB. Does take lasix 40mg  BID.  - monitor Cr - BMP  Polymyalgia rheumatica: Chronic, stable On prednisone taper at home, but unclear dosing. Per chart review, patient sees Leafy Kindle, [place] at [facility]. No dose found in chart recently. Note from 11/26/2018 stated patient was on 7mg  QD at that time. Also notes stating that she ws on taper.  -will need to vierfy dosing with rheumatologist - would opt to continue pred at 7mg   as do not want to give lower dose than she is on and this is last confirmed dose in chart  HTN: Chronic, stable BP 138/81 on admission. Normotensive today.Takes metoprolol prn at home. Pending med rec. - Hold prn metop   HLD On Crestor 20mg  QD at home. Last lipid panel 05/2018 with  LDL 106, goal LDL <70.  - cont statin per above  Gout: Chronic, stable Takes allopurinol daily. No acute gouty flare. - cont allopurinol  Anemia of Chronic Disease: Stable Also has history of Gastric AVMs noted in chart. Received fereheme yesterday.  - monitor CBC  GAD Xanax 0.5 mg prn qd. Has beentakingmore recently, up to BID. - cont xanax qd prn - consider avoiding on discharge given age  FEN/GI: Heart healthy, thin  Prophylaxis:Apixaban   Disposition: home w/ HHPT  Subjective:  Patient states she feels 'not great' but wouldn't offer specifics when asked.  She says it's not her breathing that is worse.  She is wanting [place] to call her daughter to update her on the patient's status. .    Objective: Temp:  [97.7 F (36.5 C)-98.7 F (37.1 C)] 97.9 F (36.6 C) (05/27 0342) Pulse Rate:  [69-70] 70 (05/27 0342) Resp:  [11-20] 12 (05/27 0342) BP: (96-124)/(59-80) 96/59 (05/27 0342) SpO2:  [97 %-100 %] 100 % (05/27 0342) Weight:  [81 kg] 81 kg (05/27 0046) Physical Exam: General: alert and oriented.   Cardiovascular: regular rhythm. Normal rate.  No murmur.   Respiratory: LCTAB .  Abdomen: soft, mild TTP on the left side. NBS.   Psych: word finding difficulty only noticed once today.   Laboratory: Recent Labs  Lab 05/09/19 0402 05/10/19 0402 05/11/19 0352  WBC 3.5* 3.6* 4.0  HGB 9.7* 9.9* 10.0*  HCT 30.7* 31.1* 31.0*  PLT 211 198 213   Recent Labs  Lab 05/08/19 1424 05/09/19 0402 05/10/19 0402  NA 139 141 139  K 3.7 3.3* 3.4*  CL 102 102 99  CO2 26 30 28   BUN 41* 28* 30*  CREATININE 1.94* 1.51* 1.56*  CALCIUM 9.8 9.7 10.1  PROT 6.1*  --   --   BILITOT 0.5  --   --   ALKPHOS 48  --   --   ALT 10  --   --   AST 22  --   --   GLUCOSE 125* 111* 118*    Benay Pike, MD 05/11/2019, 6:37 AM PGY-1, [facility] Intern pager: [phone], text pages welcome

## 2019-05-11 NOTE — Consult Note (Signed)
   [facility] CM Inpatient Consult   05/11/2019  [name] 08/19/1946 [id number]  Patient screened for high risk score for unplanned readmission score  and/or for hospitalizations to check if potential [facility] services are needed. Patient has had 5 ED visits in the past 6 months.    Review of patient's medical record reveals patient is from MD history and physical on 05/08/2019 as follows:  [name] is a 73 y.o. female presenting with dyspnea and worsening expressive aphasia. PMH is significant for complete heart block and a fib s/p PPM 1998, COPD, polymyalgia rheumatica, HLD, HTN, Gout, GERD, CHF, CKD III, Anemia with AVMs of stomach, sleep apnea, tracheostomy, remote hx alcohol abuse, and GAD.    Primary Care Provider is  Lucila Maine, DO with [facility].    Plan:  Patient has been assigned to Stroke EMMI by inpatient staff and [facility] staff has out reached. For questions contact:   Natividad Brood, RN BSN [facility] Liaison  [phone] business mobile phone Toll free office [phone]  Fax number: [phone] [email or web address]

## 2019-05-11 NOTE — Discharge Summary (Signed)
[facility] Discharge Summary  Patient name: [name] Medical record number: [id number] Date of birth: [birth year] Age: 73 y.o. Gender: female Date of Admission: 05/08/2019  Date of Discharge: 05/11/2019 Admitting Physician: Martyn Malay, MD  Primary Care Provider: Steve Rattler, DO Consultants: neurology  Indication for Hospitalization: expressive aphasia.   Discharge Diagnoses/Problem List:  espressive aphasia SOB Tracheostomy Afib HFpEF CKD-II polymylagia rheumatica HTN HLD Gout Anemia of chronic disease GAD  Disposition: home  Discharge Condition: improved,stable  Discharge Exam:  BP 124/77 (BP Location: Left Arm)   Pulse 70   Temp 98.7 F (37.1 C) (Oral)   Resp 16   Ht 5\' 3"  (1.6 m)   Wt 81 kg   SpO2 99%   BMI 31.63 kg/m  General: alert and oriented.   Cardiovascular: regular rhythm. Normal rate.  No murmur.   Respiratory: LCTAB .  Abdomen: soft, mild TTP on the left side. NBS.   Psych: word finding difficulty only noticed once today.   Brief Hospital Course:  Pt presented to ED with SOB and one month of word finding difficulty. She attributes the SOB to mucous buildup in her tracheostomy.  In the ED a head CT showed possible stroke in lower left parietal lobe.  Unable to obtain MRI d/t pacemaker.  Neurology was consulted and patient was admitted to family medicine teaching service.   Aphasia: neurology assessed pt and recommended TTE, pacemaker interrogation, continuing her aspirin, crestor. Echo showed >65% EF, severely increased LV thickness consistent with hypertrophic cardiomyopathy. Patient was assessed by Speech therapy.  GI was called in regards to restarting anticoagulation given her history of GI bleed in 2012.  They stated it was ok to start [facility] and pt was subsequently started on eliquis along with pantoprazole daily.  Aphasia appeared to improved throughout admission.      SOB: CXR did not show pneumonia.  Pt was satting welling on room air.  Lung exam was normal.  Respiratory therapy was consulted for trach care. Pt has passy muir valve, which she wears even when not talking which could be contributing to her SOB. RT did not find significant mucous buildup when suctioning.   Afib - pt started on eliquis.  Home metoprolol was held as it was listed as PRN.   All other conditions were chronic, and managed with home meds.    Issues for Follow Up:  1. Expressive aphasia - thought to be secondary to subacute cardioembolic stroke. Started on eliquis.  Assess for bleeding.  Obtain CBC weekly for 1 month to check for hgb stability.  HHPT/speech ordered.  2. SOB/tracheostomy - encourage pt to remove PMV when not speaking for better air exchange.   3. Polymyalgia rheumatica - unclear what dosing pt should be on based on notes.  Appears she was taking 7 1mg  tabs of prednisone.  Changed dose to 7.5, (3x2.5mg ).  4. CKD - BMP at follow up 5. Anxiety - pt on xanax at home.  Increasing usage from QD to BID.  Consider alternative anxiolytic with less side effect/dependence concerns.   Significant Procedures: none  Significant Labs and Imaging:  Recent Labs  Lab 05/09/19 0402 05/10/19 0402 05/11/19 0352  WBC 3.5* 3.6* 4.0  HGB 9.7* 9.9* 10.0*  HCT 30.7* 31.1* 31.0*  PLT 211 198 213   Recent Labs  Lab 05/08/19 1424 05/09/19 0402 05/10/19 0402 05/11/19 0352  NA 139 141 139 138  K 3.7 3.3* 3.4* 3.5  CL 102 102 99 98  CO2 26 30 28 27   GLUCOSE 125* 111* 118* 99  BUN 41* 28* 30* 38*  CREATININE 1.94* 1.51* 1.56* 1.82*  CALCIUM 9.8 9.7 10.1 10.2  MG  --  1.8  --   --   ALKPHOS 48  --   --   --   AST 22  --   --   --   ALT 10  --   --   --   ALBUMIN 3.8  --   --   --       1. The left ventricle has hyperdynamic systolic function, with an ejection fraction of >65%. The cavity size was normal. There is severely increased left ventricular wall thickness. Findings are consistent with hypertrophic  cardiomyopathy. Left  ventricular diastolic Doppler parameters are consistent with pseudonormalization.  2. The right ventricle has normal systolic function. The cavity was normal. There is no increase in right ventricular wall thickness.  3. Left atrial size was moderately dilated.  4. Right atrial size was moderately dilated.  5. Mitral valve regurgitation is moderate by color flow Doppler.  6. The aortic valve is tricuspid. Mild thickening of the aortic valve. Mild calcification of the aortic valve.  Results/Tests Pending at Time of Discharge: home w/hhpt/speech  Discharge Medications:  Allergies as of 05/11/2019      Reactions   Ace Inhibitors Swelling   angiodema   Other Anaphylaxis, Swelling   Reaction to unspecified anesthesia       Medication List    STOP taking these medications   benzonatate 100 MG capsule Commonly known as:  TESSALON   guaiFENesin 300 MG/15ML Soln   metoprolol tartrate 25 MG tablet Commonly known as:  LOPRESSOR   potassium chloride 10 MEQ tablet Commonly known as:  K-DUR     TAKE these medications   acetaminophen 650 MG CR tablet Commonly known as:  TYLENOL Take 650-1,300 mg by mouth every 8 (eight) hours as needed for pain.   albuterol 108 (90 Base) MCG/ACT inhaler Commonly known as:  VENTOLIN HFA Inhale 1-2 puffs into the lungs every 6 (six) hours as needed for wheezing or shortness of breath.   allopurinol 100 MG tablet Commonly known as:  ZYLOPRIM Take 2 tablets (200 mg total) by mouth daily.   ALPRAZolam 0.5 MG tablet Commonly known as:  XANAX TAKE 1 TABLET(0.5 MG) BY MOUTH DAILY AS NEEDED FOR ANXIETY What changed:  See the new instructions.   apixaban 5 MG Tabs tablet Commonly known as:  ELIQUIS Take 1 tablet (5 mg total) by mouth 2 (two) times daily.   aspirin EC 81 MG tablet Take 81 mg by mouth daily.   bimatoprost 0.01 % Soln Commonly known as:  LUMIGAN Place 1 drop into both eyes at bedtime.   cetirizine 10 MG  tablet Commonly known as:  ZYRTEC Take 10 mg by mouth daily.   ferrous sulfate 325 (65 FE) MG tablet TAKE 1 TABLET BY MOUTH TWICE DAILY WITH MEALS TO KEEP BLOOD COUNT UP What changed:    how much to take  how to take this  when to take this  additional instructions   furosemide 40 MG tablet Commonly known as:  LASIX Take 1 tablet (40 mg total) by mouth 2 (two) times daily.   guaiFENesin-dextromethorphan 100-10 MG/5ML syrup Commonly known as:  ROBITUSSIN DM Take 5 mLs by mouth every 4 (four) hours as needed for cough.   ipratropium-albuterol 0.5-2.5 (3) MG/3ML Soln Commonly known as:  DUONEB USE 3 ML VIA  NEBULIZER EVERY 6 HOURS What changed:    how to take this  when to take this  reasons to take this  additional instructions   magnesium hydroxide 400 MG/5ML suspension Commonly known as:  MILK OF MAGNESIA Take 60 mLs by mouth daily as needed for mild constipation.   nitroGLYCERIN 0.4 MG SL tablet Commonly known as:  NITROSTAT Place 1 tablet (0.4 mg total) under the tongue every 5 (five) minutes as needed for chest pain.   Olopatadine HCl 0.2 % Soln Place 1-2 drops into both eyes daily as needed (itching).   polyethylene glycol 17 g packet Commonly known as:  MIRALAX / GLYCOLAX Take 17 g by mouth daily as needed for mild constipation.   potassium chloride 10 MEQ tablet Commonly known as:  K-DUR TAKE 1 TABLET BY MOUTH DAILY   predniSONE 2.5 MG tablet Commonly known as:  DELTASONE Take 3 tablets (7.5 mg total) by mouth daily with breakfast. Start taking on:  May 12, 2019 What changed:    medication strength  how much to take  when to take this  Another medication with the same name was removed. Continue taking this medication, and follow the directions you see here.   rosuvastatin 20 MG tablet Commonly known as:  CRESTOR Take 1 tablet (20 mg total) by mouth daily. What changed:  when to take this   sodium chloride 0.65 % Soln nasal  spray Commonly known as:  OCEAN Place 1 spray into both nostrils daily as needed for congestion.   triamcinolone cream 0.1 % Commonly known as:  KENALOG Apply 1 application topically 2 (two) times daily. What changed:    when to take this  reasons to take this   Vitamin D 50 MCG (2000 UT) tablet Take 2,000 Units by mouth daily.       Discharge Instructions: Please refer to Patient Instructions section of EMR for full details.  Patient was counseled important signs and symptoms that should prompt return to medical care, changes in medications, dietary instructions, activity restrictions, and follow up appointments.   Follow-Up Appointments: Follow-up Information    [place]. Go on 05/16/2019.   Why:  please go to your follow up appointment on june 1st at the family medicine center.  Contact information: [place]          Benay Pike, MD 05/11/2019, 3:38 PM PGY-1, [place]

## 2019-05-11 NOTE — TOC Transition Note (Signed)
Transition of Care [facility]) - CM/SW Discharge Note   Patient Details  Name: [name] MRN: [id number] Date of Birth: [birth year]  [facility]) CM/SW Contact:  Pollie Friar, RN Phone Number: 05/11/2019, 1:49 PM   Clinical Narrative:    Pt discharging home. Son notified and will provide transport home. Pt provided coupon for Eliquis. She is comfortable with the cost per month.   Final next level of care: [place] Barriers to Discharge: No Barriers Identified   Patient Goals and CMS Choice Patient states their goals for this hospitalization and ongoing recovery are:: to get home CMS Medicare.gov Compare Post Acute Care list provided to:: Patient Represenative (must comment) Choice offered to / list presented to : Adult Children(daughter)  Discharge Placement                       Discharge Plan and Services   Discharge Planning Services: CM Consult Post Acute Care Choice: Home Health                    HH Arranged: PT, OT, Speech Therapy [place]: [place] ([facility]) Date [place]: 05/11/19 Time [place]: [place] Representative spoke with at [place]: [place] ([place]) Interventions     Readmission Risk Interventions No flowsheet data found.

## 2019-05-11 NOTE — Progress Notes (Signed)
Patient discharged home with HH 

## 2019-05-11 NOTE — Progress Notes (Signed)
Physical Therapy Treatment Patient Details Name: [name] MRN: [id number] DOB: 1946/08/05 Today's Date: 05/11/2019    History of Present Illness Pt is a 73 y/o female who presents with dyspnea and worsening expressive aphasia. PMH significant for complete heart block and a-fib s/p PPM 1998, COPD, polymyalgia rheumatica, HTN, Gout, CHF, CKD III, Anemia with AVMs of stomach, tracheostomy. CT revealed new low-density area within the lower left-parietal lobe suspicious for acute to subacute infarct.     PT Comments    Pt is progressing well with mobility and was able to ambulate in halls this session with [facility] and supervision for safety. Pt anticipates d/c home today. Will continue to follow and progress as able per POC.     Follow Up Recommendations  Home health PT;Supervision/Assistance - 24 hour     Equipment Recommendations  None recommended by PT    Recommendations for Other Services Rehab consult     Precautions / Restrictions Precautions Precautions: Fall Precaution Comments: Chronic trach with PMV safety-pinned to gown Restrictions Weight Bearing Restrictions: No    Mobility  Bed Mobility               General bed mobility comments: Pt received ambulating around room without staff present.   Transfers Overall transfer level: Modified independent Equipment used: None Transfers: Sit to/from Stand           General transfer comment: No assist required. Pt transferred to/from standing without difficulty.   Ambulation/Gait Ambulation/Gait assistance: Supervision Gait Distance (Feet): 300 Feet Assistive device: None;Straight cane Gait Pattern/deviations: Step-through pattern;Decreased stride length;Trunk flexed Gait velocity: Decreased Gait velocity interpretation: 1.31 - 2.62 ft/sec, indicative of limited community ambulator General Gait Details: Pt ambulating in room without an AD, however noted she was reaching for sink and other external  objects for balance. In hallway, uitilized cane. Increased time to get used to the sequencing of the cane, but overall pt appeared steady and with increased safety with the [facility].    Stairs             Wheelchair Mobility    Modified Rankin (Stroke Patients Only) Modified Rankin (Stroke Patients Only) Pre-Morbid Rankin Score: Slight disability Modified Rankin: Moderately severe disability     Balance Overall balance assessment: Needs assistance Sitting-balance support: Feet supported;No upper extremity supported Sitting balance-Leahy Scale: Fair     Standing balance support: No upper extremity supported;During functional activity Standing balance-Leahy Scale: Fair                 High Level Balance Comments: Pt unable to follow multiple step commands. Attempted DGI components for a second time today however pt not able to complete tasks even after multiple explanation attempts and demonstration. Occasional min assist during backwards walking and head turns.            Cognition Arousal/Alertness: Awake/alert Behavior During Therapy: WFL for tasks assessed/performed Overall Cognitive Status: Within Functional Limits for tasks assessed                                        Exercises      General Comments        Pertinent Vitals/Pain Pain Assessment: No/denies pain Faces Pain Scale: No hurt    Home Living                      Prior Function  PT Goals (current goals can now be found in the care plan section) Acute Rehab PT Goals Patient Stated Goal: Return home with children PT Goal Formulation: With patient Time For Goal Achievement: 05/23/19 Potential to Achieve Goals: Good Progress towards PT goals: Progressing toward goals    Frequency    Min 4X/week      PT Plan Discharge plan needs to be updated    Co-evaluation              AM-PAC PT "6 Clicks" Mobility   Outcome Measure  Help needed  turning from your back to your side while in a flat bed without using bedrails?: None Help needed moving from lying on your back to sitting on the side of a flat bed without using bedrails?: None Help needed moving to and from a bed to a chair (including a wheelchair)?: A Little Help needed standing up from a chair using your arms (e.g., wheelchair or bedside chair)?: A Little Help needed to walk in hospital room?: A Little Help needed climbing 3-5 steps with a railing? : A Little 6 Click Score: 20    End of Session Equipment Utilized During Treatment: Gait belt Activity Tolerance: Patient tolerated treatment well Patient left: in chair;with call bell/phone within reach;with chair alarm set Nurse Communication: Mobility status PT Visit Diagnosis: Unsteadiness on feet (R26.81);Other symptoms and signs involving the nervous system (D47.185)     Time: 5015-8682 PT Time Calculation (min) (ACUTE ONLY): 23 min  Charges:  $Gait Training: 23-37 mins                     Rolinda Roan, PT, DPT Acute Rehabilitation Services Pager: [phone] Office: [phone]    Thelma Comp 05/11/2019, 2:06 PM

## 2019-05-12 ENCOUNTER — Ambulatory Visit: Payer: Medicare Other | Admitting: Cardiovascular Disease

## 2019-05-12 DIAGNOSIS — E785 Hyperlipidemia, unspecified: Secondary | ICD-10-CM | POA: Diagnosis not present

## 2019-05-12 DIAGNOSIS — G4733 Obstructive sleep apnea (adult) (pediatric): Secondary | ICD-10-CM | POA: Diagnosis not present

## 2019-05-12 DIAGNOSIS — I5032 Chronic diastolic (congestive) heart failure: Secondary | ICD-10-CM | POA: Diagnosis not present

## 2019-05-12 DIAGNOSIS — J449 Chronic obstructive pulmonary disease, unspecified: Secondary | ICD-10-CM | POA: Diagnosis not present

## 2019-05-12 DIAGNOSIS — R1310 Dysphagia, unspecified: Secondary | ICD-10-CM | POA: Diagnosis not present

## 2019-05-12 DIAGNOSIS — I130 Hypertensive heart and chronic kidney disease with heart failure and stage 1 through stage 4 chronic kidney disease, or unspecified chronic kidney disease: Secondary | ICD-10-CM | POA: Diagnosis not present

## 2019-05-12 DIAGNOSIS — I69320 Aphasia following cerebral infarction: Secondary | ICD-10-CM | POA: Diagnosis not present

## 2019-05-12 DIAGNOSIS — M1990 Unspecified osteoarthritis, unspecified site: Secondary | ICD-10-CM | POA: Diagnosis not present

## 2019-05-12 DIAGNOSIS — I422 Other hypertrophic cardiomyopathy: Secondary | ICD-10-CM | POA: Diagnosis not present

## 2019-05-12 DIAGNOSIS — I700 Atherosclerosis of aorta: Secondary | ICD-10-CM | POA: Diagnosis not present

## 2019-05-12 DIAGNOSIS — I442 Atrioventricular block, complete: Secondary | ICD-10-CM | POA: Diagnosis not present

## 2019-05-12 DIAGNOSIS — R7303 Prediabetes: Secondary | ICD-10-CM | POA: Diagnosis not present

## 2019-05-12 DIAGNOSIS — M353 Polymyalgia rheumatica: Secondary | ICD-10-CM | POA: Diagnosis not present

## 2019-05-12 DIAGNOSIS — M1A9XX0 Chronic gout, unspecified, without tophus (tophi): Secondary | ICD-10-CM | POA: Diagnosis not present

## 2019-05-12 DIAGNOSIS — J386 Stenosis of larynx: Secondary | ICD-10-CM | POA: Diagnosis not present

## 2019-05-12 DIAGNOSIS — I209 Angina pectoris, unspecified: Secondary | ICD-10-CM | POA: Diagnosis not present

## 2019-05-12 DIAGNOSIS — Q2733 Arteriovenous malformation of digestive system vessel: Secondary | ICD-10-CM | POA: Diagnosis not present

## 2019-05-12 DIAGNOSIS — I252 Old myocardial infarction: Secondary | ICD-10-CM | POA: Diagnosis not present

## 2019-05-12 DIAGNOSIS — D631 Anemia in chronic kidney disease: Secondary | ICD-10-CM | POA: Diagnosis not present

## 2019-05-12 DIAGNOSIS — N183 Chronic kidney disease, stage 3 (moderate): Secondary | ICD-10-CM | POA: Diagnosis not present

## 2019-05-12 DIAGNOSIS — D508 Other iron deficiency anemias: Secondary | ICD-10-CM | POA: Diagnosis not present

## 2019-05-12 DIAGNOSIS — I4821 Permanent atrial fibrillation: Secondary | ICD-10-CM | POA: Diagnosis not present

## 2019-05-12 DIAGNOSIS — K219 Gastro-esophageal reflux disease without esophagitis: Secondary | ICD-10-CM | POA: Diagnosis not present

## 2019-05-12 LAB — HEMOGLOBIN A1C
Hgb A1c MFr Bld: 6 % — ABNORMAL HIGH (ref 4.8–5.6)
Mean Plasma Glucose: 126 mg/dL

## 2019-05-13 ENCOUNTER — Telehealth: Payer: Self-pay

## 2019-05-13 DIAGNOSIS — I69320 Aphasia following cerebral infarction: Secondary | ICD-10-CM | POA: Diagnosis not present

## 2019-05-13 DIAGNOSIS — J386 Stenosis of larynx: Secondary | ICD-10-CM | POA: Diagnosis not present

## 2019-05-13 DIAGNOSIS — Q2733 Arteriovenous malformation of digestive system vessel: Secondary | ICD-10-CM | POA: Diagnosis not present

## 2019-05-13 DIAGNOSIS — I252 Old myocardial infarction: Secondary | ICD-10-CM | POA: Diagnosis not present

## 2019-05-13 DIAGNOSIS — M1990 Unspecified osteoarthritis, unspecified site: Secondary | ICD-10-CM | POA: Diagnosis not present

## 2019-05-13 DIAGNOSIS — I5032 Chronic diastolic (congestive) heart failure: Secondary | ICD-10-CM | POA: Diagnosis not present

## 2019-05-13 DIAGNOSIS — I442 Atrioventricular block, complete: Secondary | ICD-10-CM | POA: Diagnosis not present

## 2019-05-13 DIAGNOSIS — R7303 Prediabetes: Secondary | ICD-10-CM | POA: Diagnosis not present

## 2019-05-13 DIAGNOSIS — I700 Atherosclerosis of aorta: Secondary | ICD-10-CM | POA: Diagnosis not present

## 2019-05-13 DIAGNOSIS — E785 Hyperlipidemia, unspecified: Secondary | ICD-10-CM | POA: Diagnosis not present

## 2019-05-13 DIAGNOSIS — J449 Chronic obstructive pulmonary disease, unspecified: Secondary | ICD-10-CM | POA: Diagnosis not present

## 2019-05-13 DIAGNOSIS — N183 Chronic kidney disease, stage 3 (moderate): Secondary | ICD-10-CM | POA: Diagnosis not present

## 2019-05-13 DIAGNOSIS — D631 Anemia in chronic kidney disease: Secondary | ICD-10-CM | POA: Diagnosis not present

## 2019-05-13 DIAGNOSIS — I422 Other hypertrophic cardiomyopathy: Secondary | ICD-10-CM | POA: Diagnosis not present

## 2019-05-13 DIAGNOSIS — G4733 Obstructive sleep apnea (adult) (pediatric): Secondary | ICD-10-CM | POA: Diagnosis not present

## 2019-05-13 DIAGNOSIS — D508 Other iron deficiency anemias: Secondary | ICD-10-CM | POA: Diagnosis not present

## 2019-05-13 DIAGNOSIS — I4821 Permanent atrial fibrillation: Secondary | ICD-10-CM | POA: Diagnosis not present

## 2019-05-13 DIAGNOSIS — R1310 Dysphagia, unspecified: Secondary | ICD-10-CM | POA: Diagnosis not present

## 2019-05-13 DIAGNOSIS — I209 Angina pectoris, unspecified: Secondary | ICD-10-CM | POA: Diagnosis not present

## 2019-05-13 DIAGNOSIS — K219 Gastro-esophageal reflux disease without esophagitis: Secondary | ICD-10-CM | POA: Diagnosis not present

## 2019-05-13 DIAGNOSIS — M353 Polymyalgia rheumatica: Secondary | ICD-10-CM | POA: Diagnosis not present

## 2019-05-13 DIAGNOSIS — M1A9XX0 Chronic gout, unspecified, without tophus (tophi): Secondary | ICD-10-CM | POA: Diagnosis not present

## 2019-05-13 DIAGNOSIS — I130 Hypertensive heart and chronic kidney disease with heart failure and stage 1 through stage 4 chronic kidney disease, or unspecified chronic kidney disease: Secondary | ICD-10-CM | POA: Diagnosis not present

## 2019-05-13 NOTE — Telephone Encounter (Signed)
Ebony Hail SLP called nurse line requesting verbal orders for speech:  1x a week for 1 week 2x a week for 2 weeks  Will recert if needed.   You can leave verbals on her VM [phone]

## 2019-05-15 ENCOUNTER — Observation Stay (HOSPITAL_COMMUNITY)
Admission: EM | Admit: 2019-05-15 | Discharge: 2019-05-17 | Disposition: A | Discharge status: Home / Self Care | Payer: Medicare Other | Attending: Family Medicine | Admitting: Family Medicine

## 2019-05-15 ENCOUNTER — Other Ambulatory Visit: Payer: Self-pay

## 2019-05-15 ENCOUNTER — Telehealth: Payer: Self-pay | Admitting: Family Medicine

## 2019-05-15 ENCOUNTER — Encounter (HOSPITAL_COMMUNITY): Payer: Self-pay

## 2019-05-15 ENCOUNTER — Emergency Department (HOSPITAL_COMMUNITY): Payer: Medicare Other

## 2019-05-15 DIAGNOSIS — M6281 Muscle weakness (generalized): Secondary | ICD-10-CM | POA: Diagnosis not present

## 2019-05-15 DIAGNOSIS — Z930 Tracheostomy status: Secondary | ICD-10-CM | POA: Diagnosis not present

## 2019-05-15 DIAGNOSIS — I4821 Permanent atrial fibrillation: Secondary | ICD-10-CM | POA: Insufficient documentation

## 2019-05-15 DIAGNOSIS — E785 Hyperlipidemia, unspecified: Secondary | ICD-10-CM | POA: Insufficient documentation

## 2019-05-15 DIAGNOSIS — K31819 Angiodysplasia of stomach and duodenum without bleeding: Secondary | ICD-10-CM | POA: Insufficient documentation

## 2019-05-15 DIAGNOSIS — D631 Anemia in chronic kidney disease: Secondary | ICD-10-CM | POA: Insufficient documentation

## 2019-05-15 DIAGNOSIS — M353 Polymyalgia rheumatica: Secondary | ICD-10-CM | POA: Insufficient documentation

## 2019-05-15 DIAGNOSIS — R0789 Other chest pain: Secondary | ICD-10-CM | POA: Diagnosis not present

## 2019-05-15 DIAGNOSIS — G4733 Obstructive sleep apnea (adult) (pediatric): Secondary | ICD-10-CM | POA: Diagnosis not present

## 2019-05-15 DIAGNOSIS — Z1159 Encounter for screening for other viral diseases: Secondary | ICD-10-CM | POA: Insufficient documentation

## 2019-05-15 DIAGNOSIS — M1A9XX0 Chronic gout, unspecified, without tophus (tophi): Secondary | ICD-10-CM | POA: Diagnosis not present

## 2019-05-15 DIAGNOSIS — F329 Major depressive disorder, single episode, unspecified: Secondary | ICD-10-CM | POA: Insufficient documentation

## 2019-05-15 DIAGNOSIS — I130 Hypertensive heart and chronic kidney disease with heart failure and stage 1 through stage 4 chronic kidney disease, or unspecified chronic kidney disease: Secondary | ICD-10-CM | POA: Insufficient documentation

## 2019-05-15 DIAGNOSIS — N183 Chronic kidney disease, stage 3 (moderate): Secondary | ICD-10-CM | POA: Diagnosis not present

## 2019-05-15 DIAGNOSIS — Z8249 Family history of ischemic heart disease and other diseases of the circulatory system: Secondary | ICD-10-CM | POA: Insufficient documentation

## 2019-05-15 DIAGNOSIS — R079 Chest pain, unspecified: Secondary | ICD-10-CM | POA: Diagnosis present

## 2019-05-15 DIAGNOSIS — R2681 Unsteadiness on feet: Secondary | ICD-10-CM | POA: Insufficient documentation

## 2019-05-15 DIAGNOSIS — Z7951 Long term (current) use of inhaled steroids: Secondary | ICD-10-CM | POA: Insufficient documentation

## 2019-05-15 DIAGNOSIS — I69320 Aphasia following cerebral infarction: Secondary | ICD-10-CM | POA: Insufficient documentation

## 2019-05-15 DIAGNOSIS — G8929 Other chronic pain: Secondary | ICD-10-CM | POA: Diagnosis not present

## 2019-05-15 DIAGNOSIS — I5032 Chronic diastolic (congestive) heart failure: Secondary | ICD-10-CM | POA: Insufficient documentation

## 2019-05-15 DIAGNOSIS — K219 Gastro-esophageal reflux disease without esophagitis: Secondary | ICD-10-CM | POA: Insufficient documentation

## 2019-05-15 DIAGNOSIS — R0600 Dyspnea, unspecified: Secondary | ICD-10-CM | POA: Diagnosis not present

## 2019-05-15 DIAGNOSIS — Z888 Allergy status to other drugs, medicaments and biological substances status: Secondary | ICD-10-CM | POA: Insufficient documentation

## 2019-05-15 DIAGNOSIS — G2581 Restless legs syndrome: Secondary | ICD-10-CM | POA: Insufficient documentation

## 2019-05-15 DIAGNOSIS — R41841 Cognitive communication deficit: Secondary | ICD-10-CM | POA: Diagnosis not present

## 2019-05-15 DIAGNOSIS — J449 Chronic obstructive pulmonary disease, unspecified: Secondary | ICD-10-CM | POA: Insufficient documentation

## 2019-05-15 DIAGNOSIS — R29898 Other symptoms and signs involving the musculoskeletal system: Secondary | ICD-10-CM | POA: Diagnosis not present

## 2019-05-15 DIAGNOSIS — K5909 Other constipation: Secondary | ICD-10-CM | POA: Insufficient documentation

## 2019-05-15 DIAGNOSIS — I422 Other hypertrophic cardiomyopathy: Secondary | ICD-10-CM | POA: Diagnosis not present

## 2019-05-15 DIAGNOSIS — R05 Cough: Secondary | ICD-10-CM | POA: Diagnosis not present

## 2019-05-15 DIAGNOSIS — Z950 Presence of cardiac pacemaker: Secondary | ICD-10-CM | POA: Diagnosis not present

## 2019-05-15 DIAGNOSIS — R0602 Shortness of breath: Secondary | ICD-10-CM

## 2019-05-15 DIAGNOSIS — Z7901 Long term (current) use of anticoagulants: Secondary | ICD-10-CM | POA: Insufficient documentation

## 2019-05-15 DIAGNOSIS — J386 Stenosis of larynx: Secondary | ICD-10-CM | POA: Diagnosis not present

## 2019-05-15 DIAGNOSIS — Z87891 Personal history of nicotine dependence: Secondary | ICD-10-CM | POA: Insufficient documentation

## 2019-05-15 DIAGNOSIS — R7303 Prediabetes: Secondary | ICD-10-CM | POA: Insufficient documentation

## 2019-05-15 DIAGNOSIS — F411 Generalized anxiety disorder: Secondary | ICD-10-CM | POA: Insufficient documentation

## 2019-05-15 DIAGNOSIS — Z833 Family history of diabetes mellitus: Secondary | ICD-10-CM | POA: Insufficient documentation

## 2019-05-15 DIAGNOSIS — Z79899 Other long term (current) drug therapy: Secondary | ICD-10-CM | POA: Insufficient documentation

## 2019-05-15 DIAGNOSIS — Z7952 Long term (current) use of systemic steroids: Secondary | ICD-10-CM | POA: Insufficient documentation

## 2019-05-15 DIAGNOSIS — F410 Panic disorder [episodic paroxysmal anxiety] without agoraphobia: Secondary | ICD-10-CM | POA: Insufficient documentation

## 2019-05-15 DIAGNOSIS — I252 Old myocardial infarction: Secondary | ICD-10-CM | POA: Insufficient documentation

## 2019-05-15 LAB — CBC WITH DIFFERENTIAL/PLATELET
Abs Immature Granulocytes: 0.05 10*3/uL (ref 0.00–0.07)
Basophils Absolute: 0 10*3/uL (ref 0.0–0.1)
Basophils Relative: 1 %
Eosinophils Absolute: 0 10*3/uL (ref 0.0–0.5)
Eosinophils Relative: 0 %
HCT: 31.1 % — ABNORMAL LOW (ref 36.0–46.0)
Hemoglobin: 9.6 g/dL — ABNORMAL LOW (ref 12.0–15.0)
Immature Granulocytes: 1 %
Lymphocytes Relative: 12 %
Lymphs Abs: 0.9 10*3/uL (ref 0.7–4.0)
MCH: 29.7 pg (ref 26.0–34.0)
MCHC: 30.9 g/dL (ref 30.0–36.0)
MCV: 96.3 fL (ref 80.0–100.0)
Monocytes Absolute: 0.3 10*3/uL (ref 0.1–1.0)
Monocytes Relative: 4 %
Neutro Abs: 6 10*3/uL (ref 1.7–7.7)
Neutrophils Relative %: 82 %
Platelets: 242 10*3/uL (ref 150–400)
RBC: 3.23 MIL/uL — ABNORMAL LOW (ref 3.87–5.11)
RDW: 14.8 % (ref 11.5–15.5)
WBC: 7.3 10*3/uL (ref 4.0–10.5)
nRBC: 0 % (ref 0.0–0.2)

## 2019-05-15 LAB — BASIC METABOLIC PANEL
Anion gap: 16 — ABNORMAL HIGH (ref 5–15)
BUN: 52 mg/dL — ABNORMAL HIGH (ref 8–23)
CO2: 24 mmol/L (ref 22–32)
Calcium: 10.8 mg/dL — ABNORMAL HIGH (ref 8.9–10.3)
Chloride: 95 mmol/L — ABNORMAL LOW (ref 98–111)
Creatinine, Ser: 1.62 mg/dL — ABNORMAL HIGH (ref 0.44–1.00)
GFR calc Af Amer: 36 mL/min — ABNORMAL LOW (ref >60)
GFR calc non Af Amer: 31 mL/min — ABNORMAL LOW (ref >60)
Glucose, Bld: 147 mg/dL — ABNORMAL HIGH (ref 70–99)
Potassium: 4.2 mmol/L (ref 3.5–5.1)
Sodium: 135 mmol/L (ref 135–145)

## 2019-05-15 LAB — D-DIMER, QUANTITATIVE: D-Dimer, Quant: 0.39 ug/mL-FEU (ref 0.00–0.50)

## 2019-05-15 LAB — SARS CORONAVIRUS 2 BY RT PCR (HOSPITAL ORDER, PERFORMED IN ~~LOC~~ HOSPITAL LAB): SARS Coronavirus 2: NEGATIVE

## 2019-05-15 LAB — BRAIN NATRIURETIC PEPTIDE: B Natriuretic Peptide: 158.6 pg/mL — ABNORMAL HIGH (ref 0.0–100.0)

## 2019-05-15 LAB — TROPONIN I: Troponin I: 0.14 ng/mL (ref <0.03)

## 2019-05-15 MED ORDER — LORATADINE 10 MG PO TABS
10.0000 mg | ORAL_TABLET | Freq: Every day | ORAL | Status: DC
  Administered 2019-05-16 – 2019-05-17 (×2): 10 mg via ORAL
  Filled 2019-05-15 (×2): qty 1

## 2019-05-15 MED ORDER — ONDANSETRON HCL 4 MG/2ML IJ SOLN: 4.0000 mg | Freq: Four times a day (QID) | INTRAMUSCULAR | Status: DC | PRN

## 2019-05-15 MED ORDER — PREDNISONE 5 MG PO TABS
7.5000 mg | ORAL_TABLET | Freq: Every day | ORAL | Status: DC
  Administered 2019-05-16 – 2019-05-17 (×2): 7.5 mg via ORAL
  Filled 2019-05-15 (×2): qty 1

## 2019-05-15 MED ORDER — ASPIRIN 81 MG PO CHEW: 324.0000 mg | CHEWABLE_TABLET | ORAL | Status: DC

## 2019-05-15 MED ORDER — ACETAMINOPHEN 325 MG PO TABS: 650.0000 mg | ORAL_TABLET | ORAL | Status: DC | PRN

## 2019-05-15 MED ORDER — FERROUS SULFATE 325 (65 FE) MG PO TABS
325.0000 mg | ORAL_TABLET | Freq: Two times a day (BID) | ORAL | Status: DC
  Administered 2019-05-16 – 2019-05-17 (×3): 325 mg via ORAL
  Filled 2019-05-15 (×3): qty 1

## 2019-05-15 MED ORDER — APIXABAN 5 MG PO TABS
5.0000 mg | ORAL_TABLET | Freq: Two times a day (BID) | ORAL | Status: DC
  Administered 2019-05-15 – 2019-05-17 (×4): 5 mg via ORAL
  Filled 2019-05-15 (×4): qty 1

## 2019-05-15 MED ORDER — ROSUVASTATIN CALCIUM 20 MG PO TABS
20.0000 mg | ORAL_TABLET | Freq: Every day | ORAL | Status: DC
  Administered 2019-05-15 – 2019-05-16 (×2): 20 mg via ORAL
  Filled 2019-05-15 (×2): qty 1

## 2019-05-15 MED ORDER — POTASSIUM CHLORIDE CRYS ER 10 MEQ PO TBCR
10.0000 meq | EXTENDED_RELEASE_TABLET | Freq: Every day | ORAL | Status: DC
  Administered 2019-05-16 – 2019-05-17 (×2): 10 meq via ORAL
  Filled 2019-05-15 (×2): qty 1

## 2019-05-15 MED ORDER — FUROSEMIDE 40 MG PO TABS
40.0000 mg | ORAL_TABLET | Freq: Two times a day (BID) | ORAL | Status: DC
  Administered 2019-05-16 – 2019-05-17 (×3): 40 mg via ORAL
  Filled 2019-05-15 (×3): qty 1

## 2019-05-15 MED ORDER — ASPIRIN 81 MG PO CHEW
324.0000 mg | CHEWABLE_TABLET | Freq: Once | ORAL | Status: DC
  Filled 2019-05-15: qty 4

## 2019-05-15 MED ORDER — ALPRAZOLAM 0.5 MG PO TABS
0.5000 mg | ORAL_TABLET | Freq: Every evening | ORAL | Status: DC | PRN
  Administered 2019-05-15 – 2019-05-16 (×2): 0.5 mg via ORAL
  Filled 2019-05-15 (×2): qty 1

## 2019-05-15 MED ORDER — ASPIRIN 300 MG RE SUPP: 300.0000 mg | RECTAL | Status: DC

## 2019-05-15 MED ORDER — ALBUTEROL SULFATE HFA 108 (90 BASE) MCG/ACT IN AERS
4.0000 | INHALATION_SPRAY | Freq: Once | RESPIRATORY_TRACT | Status: AC
  Administered 2019-05-15: 18:00:00 4 via RESPIRATORY_TRACT
  Filled 2019-05-15: qty 6.7

## 2019-05-15 MED ORDER — ALLOPURINOL 100 MG PO TABS
200.0000 mg | ORAL_TABLET | Freq: Every day | ORAL | Status: DC
  Administered 2019-05-16 – 2019-05-17 (×2): 200 mg via ORAL
  Filled 2019-05-15 (×2): qty 2

## 2019-05-15 MED ORDER — NITROGLYCERIN 0.4 MG SL SUBL: 0.4000 mg | SUBLINGUAL_TABLET | SUBLINGUAL | Status: DC | PRN

## 2019-05-15 NOTE — H&P (Addendum)
[facility] Admission History and Physical Service Pager: [phone]  Patient name: [name] Medical record number: [id number] Date of birth: [birth year] Age: 73 y.o. Gender: female  Primary Care Provider: Steve Rattler, DO Consultants: None Code Status: Full Code Emergency Contact: [name] [phone]   Chief Complaint: Chest pain and worsening SOB  Assessment and Plan: [name] is a 73 y.o. female presenting with worsening SOB and chest pain. PMH is significant for recent CVA on 5/24 with residual expressive aphasia, a-fib s/p PPM 1998, COPD, PMR, HLD, HTN, gout, GERD, CHF, CKDIII, Anemia with AVMs of stomach, OSA, traceostomy, remote history of alcohol abuse, and GAD,   Unstable Angina: Patient endorses both intermittent exertional and nonexertional "squeezing" chest pain and SOB that is relieved with rest and occasional bilateral UE numbness/tingling. Heart score of 5-6 given age and comorbidities. Of note, per patient and chart review atypical chest pain does appear to be chronic and unchanged. She denies any current chest pain. Initial troponin elevated at 0.14, but actually stable from prior admission (0.12 at discharge). EKG with paced rhythm and appears chronic ST depression in V1-V2, otherwise no new ST changes notable. Last R/L heart cath in 2017 with normal coronary arteries. Does not appear to be 2/2 to CHF exacerbation given BNP of 158, clear lungs and no LE edema. Also unlikely PE given normal D-dimer and currently on Eliquis.  In the ED, patient is hemodynamically stable on room air without any current chest pain. Electrolytes and hemoglobin stable.  Given her history, believe ACS rule-out is warranted. Appears she had a cardiology appointment on 5/28 that was canceled and never rescheduled. - Admit to obs-tele, attending Dr. Mingo [name] - trend troponins now then q6 hours x 3 - continuous cardiac monitor, pulse ox - AM EKG -  PRN Nitro for chest pain - Heart healthy diet - AM CBC, BMP - Tylenol and zofran PRN - vitals per floor  - Continue home Crestor and Elqiuis  - Per chart review, hold ASA given currently on Eliquis with history of GI bleed  SOB  Tracheostomy: Tracheostomy secondary to OSA and laryngeal stenosis, seen by ENT at [facility], most recently on 5/4 in which trach tube was replaced. Next appointment scheduled for 6/30. Endorses chronic SOB she attributes to trach mucus. She notes self-suction but unable to obtain anything significant. Does use PMV consistently which was thought to be contributing to her SOB and was encouraged to remove when able.Tubing inspected on admission and appeared clean without any notable clogs. CXR negative on admission and lungs clear on exam. Hemoglobin at baseline. Breathing comfortably with 100% O2 sats on room air on admission. Does have history of A-fib currently with pacemaker with HR of 70's, thus unlikely contributing.  - Respiratory Therapy for trach care - albuterol prn  History of CVA with residual Expressive Aphasia: Admitted for cardioembolic stroke of the lower left parietal lobe on 5/24. Started on Eliquis 5mg  BID. Has residual expressive aphasia. Does have history of GI bleed. Hgb stbale at 9.6 (baseline 9-10). No new focal neurological deficits appreciated on exam. - Continue Eliquis 5mg  BID  Atrial Fibrillation and complete heart block s/p PPM 1998 Follows with cardiology. Last pacemaker check 09/2018 that was WNL. Home meds: Eliquis 5mg  BID.  - cardiac telemetry  HFpEF: chronic, stable Last echo (05/09/19) notable for EF >65%, severely increased LV wall thickness, and moderate mitral valve regurg. Home mesd: Lasix 40mg  BID and Potassium 13mEq QD. Appears euvolemic on exam. -  continue home meds  CKD Stage IIIb Cr  1.62 which appears to be at baseline and improved since discharge on 5/27. GFR 36. - monitor Cr, daily BMP - Continue home Lasix 40mg   BID  Polymyalgia rheumatica: Chronic, stable Discharged on 5/27 on Prednisone 7.5mg  QD. Follows with [name] Kindle, PA at [facility]. - continue home meds  HTN: Chronic, stable BP on admission 131/85. History of taking Metoprolol PRN, held at last discharge. - continue to monitor - will continue to hold Metoprolol  HLD Last lipid panel 5/25: Chol 136, HDL 60, LDL 56, Trig 99. Home meds: Rosuvastatin 20mg  QD - continue home meds  Gout: Chronic, stable Home meds: Allopurinol 200mg  QD.  No acute gouty flare. - contineu home meds  Anemia of Chronic Disease: Stable Endorsed darker stools but is currently on iron supplement. Has a history of gastric AVMs per chart review and is currently on Eliquis for recent cardioembolic stroke. Received IV feraheme during last admission.  Hgb stable at 9.6 (baseline 9-10). Home meds: Iron 325mg  QD. Last colonoscopy in 2018 notable for several polyps with recommendation for repeat in 3 years. - FOBT to rule out GI bleed - monitor CBC  GAD Home meds: Xanax 0.5 mg prn qd. Discussion at discharge of transitioning to new anxiolytic. Can consider at follow up outpatient visit. - cont xanax qHS prn  FEN/GI: Heart Healthy Prophylaxis: Eliquis   Disposition: Obs for ACS rule-out  History of Present Illness:  [name] is a 73 y.o. female presenting with worsening SOB and intermittent chest pain. She notes the chest pain comes and goes. She notes it is located on the left side and sometimes substernal, lasts ~30 secs at most, feels like a "squeeze". Notes intermittent bilaterally UE numbness that comes and goes but does not seem related to her chest pain. She also notes occasional feelin gof her heart "Skipping a beat". Chest pain occurs when she lays down, sometimes wakes up with chest pain, improves when she sits up. Does get some chest pain with exertion at times, goes away when she sits down. She notes sometimes she experiences SOB  and diaphoresis with the chest pain as well. Has not tried taking nitroglycerin. She notes she had this similar chest pain during her last admission and it has not changed since. She also notes worsening SOB especially with exertion. She and her son try to suction but unable to obtain anything. Was given albuterol in the ED without much improvement. Denies any current chest pain. Notes similar SOB as usual. Feels her trach needs to be suctioned out.  Of note, patient states the reason she came to the ER today was because she was having difficulty managing her secretions and felt short of breath. She did have chest pain this morning that woke her out of sleep that self resolved by the time she arrived to the hospital.   Review Of Systems: Per HPI with the following additions:  Review of Systems  Constitutional: Positive for diaphoresis. Negative for chills, fever and weight loss.  HENT: Negative for congestion and sore throat.   Respiratory: Positive for shortness of breath. Negative for cough and sputum production.   Cardiovascular: Positive for chest pain. Negative for palpitations, orthopnea and leg swelling.  Gastrointestinal: Positive for diarrhea and melena (Does have dark stools but currently on iron). Negative for abdominal pain, blood in stool, constipation, nausea and vomiting.  Genitourinary: Negative for dysuria and hematuria.  Neurological: Positive for dizziness, tingling, speech change, weakness and  headaches.    Patient Active Problem List   Diagnosis Date Noted  . Cerebral embolism with cerebral infarction 05/09/2019  . Expressive aphasia 05/08/2019  . Other abnormal glucose 11/26/2018  . Polypharmacy 06/25/2018  . Depression with anxiety   . Tracheostomy in place [facility])   . Tracheostomy care ([place])   . Calculus of gallbladder without cholecystitis without obstruction 12/24/2017  . Chronic cough 09/25/2017  . History of colonic polyps   . Moderate protein-calorie  malnutrition ([place]) 06/02/2017  . Chronic idiopathic constipation 03/19/2017  . 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 Permanent atrial fibrillation   . Laryngeal stenosis 09/18/2016  . Esophageal dysphagia   . Polymyalgia rheumatica ([place]) 04/10/2015  . Hypertrophic cardiomyopathy ([place]) 02/15/2015  . Eczema 09/18/2014  . Environmental allergies 09/18/2014  . Pre-diabetes 11/26/2013  . COPD, moderate ([place]) 11/01/2013  . Complete heart block ([place]) 04/03/2011  . Anemia 02/17/2011  . AVM (arteriovenous malformation) 02/13/2011  . PACEMAKER-[facility] 11/28/2010  . CKD (chronic kidney disease) stage 3, GFR 30-59 ml/min (HCC) 09/24/2010  . Acute on chronic congestive heart failure ([place]) 08/30/2010  . ARTHRITIS 07/24/2010  . Generalized anxiety disorder 07/23/2010  . Hyperlipidemia 02/11/2007  . Essential hypertension 02/11/2007  . APNEA, SLEEP 02/11/2007    Past Medical History: Past Medical History:  Diagnosis Date  . Anemia   . Angina   . Angioedema    2/2 ACE  . Arteriovenous malformation of stomach   . Arthritis   . Asthma   . AVM (arteriovenous malformation) of colon    small intestine; stomach  . Blood transfusion   . CHF (congestive heart failure) ([place])   . Complete heart block ([place])    s/p PPM 1998  . Complication of anesthesia    Difficult airway; anaphylaxis and swelling with propofol  . DDD (degenerative disc disease)   . Depression   . Diastolic heart failure   . Fatty liver 07/26/10  . GERD (gastroesophageal reflux disease)   . GI bleed   . Headache   . History of alcohol abuse Stopped Fall 2012  . History of tobacco use Quit Fall 2012  . Hx of cardiovascular stress test    a. Lexiscan Myoview (10/15):  Small inferolateral and apical defect c/w scar and poss soft tissue attenuation, no ischemia, EF 42%  . Hx of colonic polyp 08/13/10   adenomatous  . Hx of colonoscopy   . Hyperlipidemia   . Hyperlipidemia   . Hypertension   . Hypertrophic cardiomyopathy ([place])     dx by Dr [name] 2009  . Iron deficiency anemia   . Myocardial infarction ([place])   . Panic attack   . Panic attacks   . Permanent atrial fibrillation   . Pneumonia   . Renal failure    baseline creatinine 1.6  . Right arm pain 01/08/2012  . RLS (restless legs syndrome)    Dx 06/2007  . Shortness of breath    sob on exertation  . Sleep apnea     Past Surgical History: Past Surgical History:  Procedure Laterality Date  . CARDIAC CATHETERIZATION    . CARDIAC CATHETERIZATION N/A 08/19/2016   Procedure: Right/Left Heart Cath and Coronary Angiography;  Surgeon: Jolaine Artist, MD;  Location: [facility];  Service: Cardiovascular;  Laterality: N/A;  . COLONOSCOPY WITH PROPOFOL N/A 06/16/2017   Procedure: COLONOSCOPY WITH PROPOFOL;  Surgeon: Mauri Pole, MD;  Location: [facility];  Service: Endoscopy;  Laterality: N/A;  . DIRECT LARYNGOSCOPY N/A 09/18/2016  Procedure: DIRECT LARYNGOSCOPY, BRONCHOSCOPY, REMOVAL OF INTUBATION GRANULOMA;  Surgeon: [name], MD;  Location: [facility];  Service: ENT;  Laterality: N/A;  . DIRECT LARYNGOSCOPY N/A 10/20/2016   Procedure: EXTUBATION AND FLEXIBLE LARYNGOSCOPE;  Surgeon: [name], MD;  Location: [place];  Service: ENT;  Laterality: N/A;  . DIRECT LARYNGOSCOPY N/A 10/29/2016   Procedure: DIRECT LARYNGOSCOPY;  Surgeon: [name], MD;  Location[facility];  Service: ENT;  Laterality: N/A;  . ESOPHAGOGASTRODUODENOSCOPY  12/23/2011   Procedure: ESOPHAGOGASTRODUODENOSCOPY (EGD);  Surgeon: [name], MD;  Location: [facility];  Service: Endoscopy;  Laterality: N/A;  . ESOPHAGOGASTRODUODENOSCOPY (EGD) WITH PROPOFOL N/A 06/16/2017   Procedure: ESOPHAGOGASTRODUODENOSCOPY (EGD) WITH PROPOFOL;  Surgeon: Mauri Pole, MD;  Location: [facility];  Service: Endoscopy;  Laterality: N/A;  . EXTUBATION (ENDOTRACHEAL) IN OR N/A 07/21/2016   Procedure: EXTUBATION (ENDOTRACHEAL) IN OR;  Surgeon: [name], MD;  Location: [place];  Service: ENT;   Laterality: N/A;  . GIVENS CAPSULE STUDY  12/23/2011   Procedure: GIVENS CAPSULE STUDY;  Surgeon: [name], MD;  Location: [facility];  Service: Endoscopy;  Laterality: N/A;  . MICROLARYNGOSCOPY WITH LASER N/A 10/16/2016   Procedure: MICRODIRECTLARYNGOSCOPY WITH LASER ABLATION AND KENLOG INJECTION;  Surgeon: [name], MD;  Location: [place];  Service: ENT;  Laterality: N/A;  . PACEMAKER INSERTION  1998   st jude, most recent gen change by [name] 4/12  . TRACHEOSTOMY TUBE PLACEMENT N/A 10/29/2016   Procedure: TRACHEOSTOMY;  Surgeon: [name], MD;  Location[facility];  Service: ENT;  Laterality: N/A;  . TUBAL LIGATION  04/01/2000    Social History: Social History   Tobacco Use  . Smoking status: Former Smoker    Packs/day: 0.10    Years: 50.00    Pack years: 5.00    Types: Cigarettes    Last attempt to quit: 08/16/2011    Years since quitting: 7.7  . Smokeless tobacco: Never Used  . Tobacco comment: quit 2012  Substance Use Topics  . Alcohol use: No    Comment: quit 08/16/2011  . Drug use: No   Additional social history: Denies current alcohol use. Last drank 5-6 years ago. Drank 1 pint of liquor over a week since 73 years old. Smoked 1 PPD x 60 years.  Denies any illicit drugs. Please also refer to relevant sections of EMR.  Family History: Family History  Problem Relation Age of Onset  . Hypertension Mother   . Stroke Mother   . Heart disease Mother   . Aneurysm Mother   . CVA Mother   . Hypertension Father   . Stroke Father   . Heart attack Father   . Alcohol abuse Father   . Hypertension Sister   . Hypertension Brother   . Colon cancer Brother   . Cancer Brother   . Diabetes Sister   . Diabetes Brother   . Heart attack Brother   . Heart attack Brother   . Heart disease Sister   . Alcohol abuse Brother   . Heart disease Daughter     Allergies and Medications: Allergies  Allergen Reactions  . Ace Inhibitors Swelling    angiodema  . Other Anaphylaxis  and Swelling    Reaction to unspecified anesthesia    No current facility-administered medications on file prior to encounter.    Current Outpatient Medications on File Prior to Encounter  Medication Sig Dispense Refill  . acetaminophen (TYLENOL) 650 MG CR tablet Take 650-1,300 mg by mouth every  8 (eight) hours as needed for pain.    [name] albuterol (PROVENTIL HFA;VENTOLIN HFA) 108 (90 Base) MCG/ACT inhaler Inhale 1-2 puffs into the lungs every 6 (six) hours as needed for wheezing or shortness of breath. 1 Inhaler 6  . allopurinol (ZYLOPRIM) 100 MG tablet Take 2 tablets (200 mg total) by mouth daily. 180 tablet 3  . ALPRAZolam (XANAX) 0.5 MG tablet TAKE 1 TABLET(0.5 MG) BY MOUTH DAILY AS NEEDED FOR ANXIETY (Patient taking differently: Take 0.5 mg by mouth daily. ) 90 tablet 0  . apixaban (ELIQUIS) 5 MG TABS tablet Take 1 tablet (5 mg total) by mouth 2 (two) times daily. 60 tablet 0  . bimatoprost (LUMIGAN) 0.01 % SOLN Place 1 drop into both eyes at bedtime.    . cetirizine (ZYRTEC) 10 MG tablet Take 10 mg by mouth daily.    . Cholecalciferol (VITAMIN D) 50 MCG (2000 UT) tablet Take 2,000 Units by mouth daily.    . ferrous sulfate 325 (65 FE) MG tablet TAKE 1 TABLET BY MOUTH TWICE DAILY WITH MEALS TO KEEP BLOOD COUNT UP (Patient taking differently: Take 325 mg by mouth 2 (two) times daily with a meal. ) 60 tablet 11  . furosemide (LASIX) 40 MG tablet Take 1 tablet (40 mg total) by mouth 2 (two) times daily. 180 tablet 3  . guaiFENesin-dextromethorphan (ROBITUSSIN DM) 100-10 MG/5ML syrup Take 5 mLs by mouth every 4 (four) hours as needed for cough.    [name] ipratropium-albuterol (DUONEB) 0.5-2.5 (3) MG/3ML SOLN USE 3 ML VIA NEBULIZER EVERY 6 HOURS (Patient taking differently: Take 3 mLs by nebulization every 6 (six) hours as needed (shortness of breath). ) 360 mL 0  . magnesium hydroxide (MILK OF MAGNESIA) 400 MG/5ML suspension Take 60 mLs by mouth daily as needed for mild constipation.     . nitroGLYCERIN  (NITROSTAT) 0.4 MG SL tablet Place 1 tablet (0.4 mg total) under the tongue every 5 (five) minutes as needed for chest pain. 25 tablet 1  . Olopatadine HCl 0.2 % SOLN Place 1-2 drops into both eyes daily as needed (itching).   1  . polyethylene glycol (MIRALAX / GLYCOLAX) packet Take 17 g by mouth daily as needed for mild constipation. 100 each 1  . potassium chloride (K-DUR) 10 MEQ tablet TAKE 1 TABLET BY MOUTH DAILY (Patient taking differently: Take 10 mEq by mouth daily. ) 90 tablet 0  . predniSONE (DELTASONE) 2.5 MG tablet Take 3 tablets (7.5 mg total) by mouth daily with breakfast. 14 tablet 0  . rosuvastatin (CRESTOR) 20 MG tablet Take 1 tablet (20 mg total) by mouth daily. (Patient taking differently: Take 20 mg by mouth at bedtime. ) 90 tablet 3  . sodium chloride (OCEAN) 0.65 % SOLN nasal spray Place 1 spray into both nostrils daily as needed for congestion.     . triamcinolone cream (KENALOG) 0.1 % Apply 1 application topically 2 (two) times daily. (Patient taking differently: Apply 1 application topically 2 (two) times daily as needed (rash). ) 30 g 2    Objective: BP 112/78   Pulse 70   Temp 98.6 F (37 C) (Oral)   Resp (!) 24   Ht 5\' 3"  (1.6 m)   Wt 81 kg   SpO2 100%   BMI 31.63 kg/m  Exam: General: pleasant older lady, well nourished, well developed, in no acute distress with non-toxic appearance, sitting up comfortably in bed with trach and PMV in place  HEENT: normocephalic, atraumatic, moist mucous membranes, oropharynx clear  without erythema or exudate Neck: supple, normal ROM CV: regular rate and rhythm, no murmur appreciated, no lower extremity edema, 2+ radial and pedal pulses bilaterally Lungs: clear to auscultation bilaterally with normal work of breathing on room air Abdomen: soft, non-tender, non-distended, normoactive bowel sounds Skin: warm, dry Extremities: warm and well perfused Neuro: Alert and oriented, intermittent expressive aphasia, no focal  neurological deficits   Labs and Imaging: CBC BMET  Recent Labs  Lab 05/15/19 1645  WBC 7.3  HGB 9.6*  HCT 31.1*  PLT 242   Recent Labs  Lab 05/15/19 1645  NA 135  K 4.2  CL 95*  CO2 24  BUN 52*  CREATININE 1.62*  GLUCOSE 147*  CALCIUM 10.8*     Troponin: 0.14 D-dimer: 0.39 BNP: 159.6 COVID: pending  Dg Chest Portable 1 View  Result Date: 05/15/2019 CLINICAL DATA:  Acute onset dyspnea today. EXAM: PORTABLE CHEST 1 VIEW COMPARISON:  CT chest and single view of the chest 05/08/2019. FINDINGS: Lungs clear. There is cardiomegaly without edema. No pneumothorax or pleural effusion. Pacing device and tracheostomy tube again seen. IMPRESSION: No acute disease. Electronically Signed   By: Inge Rise M.D.   On: 05/15/2019 17:18    Danna Hefty, DO 05/15/2019, 7:37 PM PGY-1, [facility] pager: [phone], text pages welcome  FPTS Upper-Level Resident Addendum  I have independently interviewed and examined the patient. I have discussed the above with the original author and agree with their documentation. My edits for correction/addition/clarification are in blue. Please see also any attending notes.   Bufford Lope, DO PGY-3, [place] 05/15/2019 10:21 PM  [facility] pager: [phone] (text pages welcome through [place])

## 2019-05-15 NOTE — ED Triage Notes (Signed)
Pt from home with shortness of breath that started to increas this morning pt was just discharged on Wednesday for a stroke.

## 2019-05-15 NOTE — ED Notes (Signed)
Updated family.

## 2019-05-15 NOTE — ED Notes (Signed)
ED TO INPATIENT HANDOFF REPORT  ED Nurse Name and Phone #:  Rod Holler [phone]  S Name/Age/Gender [name] 73 y.o. female Room/Bed: 032C/032C  Code Status   Code Status: Full Code  Home/SNF/Other Home Patient oriented to: self, place, time and situation Is this baseline? Yes   Triage Complete: Triage complete  Chief Complaint SOB  Triage Note Pt from home with shortness of breath that started to increas this morning pt was just discharged on Wednesday for a stroke.    Allergies Allergies  Allergen Reactions  . Ace Inhibitors Swelling    angiodema  . Other Anaphylaxis and Swelling    Reaction to unspecified anesthesia     Level of Care/Admitting Diagnosis ED Disposition    ED Disposition Condition [facility] Area: [place] [[id number]]  Level of Care: Telemetry Cardiac [103]  Covid Evaluation: Screening Protocol (No Symptoms)  Diagnosis: Chest pain on exertion [924268]  Admitting Physician: Danna Hefty [[id number]]  Attending Physician: Mingo Amber, JEFFREY H [[id number]]  PT Class (Do Not Modify): Observation [104]  PT Acc Code (Do Not Modify): Observation [10022]       B Medical/Surgery History Past Medical History:  Diagnosis Date  . Anemia   . Angina   . Angioedema    2/2 ACE  . Arteriovenous malformation of stomach   . Arthritis   . Asthma   . AVM (arteriovenous malformation) of colon    small intestine; stomach  . Blood transfusion   . CHF (congestive heart failure) ([place])   . Complete heart block ([place])    s/p PPM 1998  . Complication of anesthesia    Difficult airway; anaphylaxis and swelling with propofol  . DDD (degenerative disc disease)   . Depression   . Diastolic heart failure   . Fatty liver 07/26/10  . GERD (gastroesophageal reflux disease)   . GI bleed   . Headache   . History of alcohol abuse Stopped Fall 2012  . History of tobacco use Quit Fall 2012  . Hx of cardiovascular stress test    a.  Lexiscan Myoview (10/15):  Small inferolateral and apical defect c/w scar and poss soft tissue attenuation, no ischemia, EF 42%  . Hx of colonic polyp 08/13/10   adenomatous  . Hx of colonoscopy   . Hyperlipidemia   . Hyperlipidemia   . Hypertension   . Hypertrophic cardiomyopathy ([place])    dx by Dr [name] 2009  . Iron deficiency anemia   . Myocardial infarction ([place])   . Panic attack   . Panic attacks   . Permanent atrial fibrillation   . Pneumonia   . Renal failure    baseline creatinine 1.6  . Right arm pain 01/08/2012  . RLS (restless legs syndrome)    Dx 06/2007  . Shortness of breath    sob on exertation  . Sleep apnea    Past Surgical History:  Procedure Laterality Date  . CARDIAC CATHETERIZATION    . CARDIAC CATHETERIZATION N/A 08/19/2016   Procedure: Right/Left Heart Cath and Coronary Angiography;  Surgeon: Jolaine Artist, MD;  Location: [facility];  Service: Cardiovascular;  Laterality: N/A;  . COLONOSCOPY WITH PROPOFOL N/A 06/16/2017   Procedure: COLONOSCOPY WITH PROPOFOL;  Surgeon: Mauri Pole, MD;  Location: [facility];  Service: Endoscopy;  Laterality: N/A;  . DIRECT LARYNGOSCOPY N/A 09/18/2016   Procedure: DIRECT LARYNGOSCOPY, BRONCHOSCOPY, REMOVAL OF INTUBATION GRANULOMA;  Surgeon: [name], MD;  Location: [facility];  Service: ENT;  Laterality: N/A;  . DIRECT LARYNGOSCOPY N/A 10/20/2016   Procedure: EXTUBATION AND FLEXIBLE LARYNGOSCOPE;  Surgeon: [name], MD;  Location: [facility];  Service: ENT;  Laterality: N/A;  . DIRECT LARYNGOSCOPY N/A 10/29/2016   Procedure: DIRECT LARYNGOSCOPY;  Surgeon: [name], MD;  Location: [facility] OR;  Service: ENT;  Laterality: N/A;  . ESOPHAGOGASTRODUODENOSCOPY  12/23/2011   Procedure: ESOPHAGOGASTRODUODENOSCOPY (EGD);  Surgeon: [name], MD;  Location: [facility];  Service: Endoscopy;  Laterality: N/A;  . ESOPHAGOGASTRODUODENOSCOPY (EGD) WITH PROPOFOL N/A 06/16/2017   Procedure: ESOPHAGOGASTRODUODENOSCOPY (EGD)  WITH PROPOFOL;  Surgeon: Mauri Pole, MD;  [facility]: [facility];  Service: Endoscopy;  Laterality: N/A;  . EXTUBATION (ENDOTRACHEAL) IN OR N/A 07/21/2016   Procedure: EXTUBATION (ENDOTRACHEAL) IN OR;  Surgeon: [name], MD;  Location: [facility];  Service: ENT;  Laterality: N/A;  . GIVENS CAPSULE STUDY  12/23/2011   Procedure: GIVENS CAPSULE STUDY;  Surgeon: [name], MD;  [facility];  Service: Endoscopy;  Laterality: N/A;  . MICROLARYNGOSCOPY WITH LASER N/A 10/16/2016   Procedure: MICRODIRECTLARYNGOSCOPY WITH LASER ABLATION AND KENLOG INJECTION;  Surgeon: [name], MD;  Location: [place];  Service: ENT;  Laterality: N/A;  . PACEMAKER INSERTION  1998   st jude, most recent gen change by [name] 4/12  . TRACHEOSTOMY TUBE PLACEMENT N/A 10/29/2016   Procedure: TRACHEOSTOMY;  Surgeon: [name], MD;  Location: [facility];  Service: ENT;  Laterality: N/A;  . TUBAL LIGATION  04/01/2000     A IV Location/Drains/Wounds Patient Lines/Drains/Airways Status   Active Line/Drains/Airways    Name:   Placement date:   Placement time:   Site:   Days:   Peripheral IV 05/15/19 Left Forearm   05/15/19    1729    Forearm   less than 1   Incision (Closed) 09/18/16 N/A   09/18/16    0749     969   Incision (Closed) 10/29/16 Neck Other (Comment)   10/29/16    1441     928   Tracheostomy [place] 4 mm Uncuffed   05/22/18    -    4 mm   358          Intake/Output Last 24 hours No intake or output data in the 24 hours ending 05/15/19 2103  Labs/Imaging Results for orders placed or performed during the hospital encounter of 05/15/19 (from the past 48 hour(s))  Basic metabolic panel     Status: Abnormal   Collection Time: 05/15/19  4:45 PM  Result Value Ref Range   Sodium 135 135 - 145 mmol/L   Potassium 4.2 3.5 - 5.1 mmol/L   Chloride 95 (L) 98 - 111 mmol/L   CO2 24 22 - 32 mmol/L   Glucose, Bld 147 (H) 70 - 99 mg/dL   BUN 52 (H) 8 - 23 mg/dL   Creatinine, Ser 1.62 (H) 0.44 - 1.00  mg/dL   Calcium 10.8 (H) 8.9 - 10.3 mg/dL   GFR calc non Af Amer 31 (L) >60 mL/min   GFR calc Af Amer 36 (L) >60 mL/min   Anion gap 16 (H) 5 - 15    Comment: Performed at [facility], [address], [place], [place] [address]  Troponin I - Once     Status: Abnormal   Collection Time: 05/15/19  4:45 PM  Result Value Ref Range   Troponin I 0.14 (HH) <0.03 ng/mL    Comment: CRITICAL RESULT CALLED TO, READ BACK  BY AND VERIFIED WITH: [email or web address],[facility] @ [id number] 05/15/2019 [place] Performed at [facility], [place] [address] [place], [place] [address]   CBC with Differential     Status: Abnormal   Collection Time: 05/15/19  4:45 PM  Result Value Ref Range   WBC 7.3 4.0 - 10.5 K/uL   RBC 3.23 (L) 3.87 - 5.11 MIL/uL   Hemoglobin 9.6 (L) 12.0 - 15.0 g/dL   HCT 31.1 (L) 36.0 - 46.0 %   MCV 96.3 80.0 - 100.0 fL   MCH 29.7 26.0 - 34.0 pg   MCHC 30.9 30.0 - 36.0 g/dL   RDW 14.8 11.5 - 15.5 %   Platelets 242 150 - 400 K/uL   nRBC 0.0 0.0 - 0.2 %   Neutrophils Relative % 82 %   Neutro Abs 6.0 1.7 - 7.7 K/uL   Lymphocytes Relative 12 %   Lymphs Abs 0.9 0.7 - 4.0 K/uL   Monocytes Relative 4 %   Monocytes Absolute 0.3 0.1 - 1.0 K/uL   Eosinophils Relative 0 %   Eosinophils Absolute 0.0 0.0 - 0.5 K/uL   Basophils Relative 1 %   Basophils Absolute 0.0 0.0 - 0.1 K/uL   Immature Granulocytes 1 %   Abs Immature Granulocytes 0.05 0.00 - 0.07 K/uL    Comment: Performed at [facility] [address] [place], [place] [address]  D-dimer, quantitative     Status: None   Collection Time: 05/15/19  4:45 PM  Result Value Ref Range   D-Dimer, Quant 0.39 0.00 - 0.50 ug/mL-FEU    Comment: (NOTE) At the manufacturer cut-off of 0.50 ug/mL FEU, this assay has been documented to exclude PE with a sensitivity and negative predictive value of 97 to 99%.  At this time, this assay has not been approved by the FDA to exclude DVT/VTE. Results should be correlated with clinical  presentation. Performed at [facility], [place] [address], [place], [place] [address]   Brain natriuretic peptide     Status: Abnormal   Collection Time: 05/15/19  4:45 PM  Result Value Ref Range   B Natriuretic Peptide 158.6 (H) 0.0 - 100.0 pg/mL    Comment: Performed at [facility] [address] [place], [place] [address]   *Note: Due to a large number of results and/or encounters for the requested time period, some results have not been displayed. A complete set of results can be found in Results Review.   Dg Chest Portable 1 View  Result Date: 05/15/2019 CLINICAL DATA:  Acute onset dyspnea today. EXAM: PORTABLE CHEST 1 VIEW COMPARISON:  CT chest and single view of the chest 05/08/2019. FINDINGS: Lungs clear. There is cardiomegaly without edema. No pneumothorax or pleural effusion. Pacing device and tracheostomy tube again seen. IMPRESSION: No acute disease. Electronically Signed   By: Inge Rise M.D.   On: 05/15/2019 17:18    Pending Labs Unresulted Labs (From admission, onward)    Start     Ordered   05/16/19 4163  Basic metabolic panel  Tomorrow morning,   R     05/15/19 2050   05/16/19 0500  CBC  Tomorrow morning,   R     05/15/19 2050   05/16/19 0500  Protime-INR  Tomorrow morning,   R     05/15/19 2050   05/15/19 2050  Troponin I - Now Then Q6H  Now then every 6 hours,   STAT     05/15/19 2050   05/15/19  2050  Occult blood card to lab, stool RN will collect  Once,   R    Question:  Specimen to be collected by?  Answer:  RN will collect   05/15/19 2050   05/15/19 1857  SARS Coronavirus 2 (CEPHEID - Performed in [place] hospital lab), Hosp Order  (Asymptomatic Patients Labs)  Once,   R    Question:  Rule Out  Answer:  Yes   05/15/19 1856          Vitals/Pain Today's Vitals   05/15/19 2015 05/15/19 2030 05/15/19 2049 05/15/19 2100  BP:      Pulse: 78 69 73 69  Resp: 19 (!) 25 (!) 21 19  Temp:      TempSrc:      SpO2: (!) 64% 100% 100% 100%   Weight:      Height:      PainSc:        Isolation Precautions No active isolations  Medications Medications  allopurinol (ZYLOPRIM) tablet 200 mg (has no administration in time range)  furosemide (LASIX) tablet 40 mg (has no administration in time range)  rosuvastatin (CRESTOR) tablet 20 mg (has no administration in time range)  predniSONE (DELTASONE) tablet 7.5 mg (has no administration in time range)  apixaban (ELIQUIS) tablet 5 mg (has no administration in time range)  ferrous sulfate tablet 325 mg (has no administration in time range)  potassium chloride (K-DUR) CR tablet 10 mEq (has no administration in time range)  loratadine (CLARITIN) tablet 10 mg (has no administration in time range)  aspirin chewable tablet 324 mg (has no administration in time range)    Or  aspirin suppository 300 mg (has no administration in time range)  nitroGLYCERIN (NITROSTAT) SL tablet 0.4 mg (has no administration in time range)  acetaminophen (TYLENOL) tablet 650 mg (has no administration in time range)  ondansetron (ZOFRAN) injection 4 mg (has no administration in time range)  albuterol (VENTOLIN HFA) 108 (90 Base) MCG/ACT inhaler 4 puff (4 puffs Inhalation Given 05/15/19 1743)    Mobility walks with device Low fall risk   Focused Assessments Cardiac Assessment Handoff:    Lab Results  Component Value Date   CKTOTAL 61 05/14/2017   CKMB 2.7 08/27/2012   TROPONINI 0.14 (HH) 05/15/2019   Lab Results  Component Value Date   DDIMER 0.39 05/15/2019   Does the Patient currently have chest pain? No     R Recommendations: See Admitting Provider Note  Report given to:   Additional Notes:

## 2019-05-15 NOTE — ED Notes (Signed)
Date and time results received: 05/15/19 5:32 PM   Test: Troponin Critical Value: 0.14  Name of Provider Notified: Regenia Skeeter MD

## 2019-05-15 NOTE — ED Notes (Signed)
[name], daughter- [phone]

## 2019-05-15 NOTE — ED Provider Notes (Signed)
[place] EMERGENCY DEPARTMENT Provider Note   CSN: [id number] Arrival date & time: 05/15/19  1600    History   Chief Complaint Chief Complaint  Patient presents with   Shortness of Breath    HPI [name] is a 73 y.o. female.     HPI  73 year old female presents with acute dyspnea.  Started this morning.  The history is somewhat limited as the patient still has a little bit of word finding difficulty from a recent stroke.  History is taken from patient and her daughter [name].  Patient was doing well after discharge a few days ago.  This morning woke up with the dyspnea.  Has some chronic cough and clearing of secretions but no new symptoms.  No fevers.  Patient reports no leg swelling. She has some chest pain that comes and goes throughout the day. Hard to describe the pain.  Past Medical History:  Diagnosis Date   Anemia    Angina    Angioedema    2/2 ACE   Arteriovenous malformation of stomach    Arthritis    Asthma    AVM (arteriovenous malformation) of colon    small intestine; stomach   Blood transfusion    CHF (congestive heart failure) (HCC)    Complete heart block (HCC)    s/p PPM 7253   Complication of anesthesia    Difficult airway; anaphylaxis and swelling with propofol   DDD (degenerative disc disease)    Depression    Diastolic heart failure    Fatty liver 07/26/10   GERD (gastroesophageal reflux disease)    GI bleed    Headache    History of alcohol abuse Stopped Fall 2012   History of tobacco use Quit Fall 2012   Hx of cardiovascular stress test    a. Lexiscan Myoview (10/15):  Small inferolateral and apical defect c/w scar and poss soft tissue attenuation, no ischemia, EF 42%   Hx of colonic polyp 08/13/10   adenomatous   Hx of colonoscopy    Hyperlipidemia    Hyperlipidemia    Hypertension    Hypertrophic cardiomyopathy ([place])    dx by Dr [name] 2009   Iron deficiency anemia     Myocardial infarction [facility])    Panic attack    Panic attacks    Permanent atrial fibrillation    Pneumonia    Renal failure    baseline creatinine 1.6   Right arm pain 01/08/2012   RLS (restless legs syndrome)    Dx 06/2007   Shortness of breath    sob on exertation   Sleep apnea     Patient Active Problem List   Diagnosis Date Noted   Cerebral embolism with cerebral infarction 05/09/2019   Expressive aphasia 05/08/2019   Other abnormal glucose 11/26/2018   Polypharmacy 06/25/2018   Depression with anxiety    Tracheostomy in place [facility])    Tracheostomy care ([place])    Calculus of gallbladder without cholecystitis without obstruction 12/24/2017   Chronic cough 09/25/2017   History of colonic polyps    Moderate protein-calorie malnutrition ([place]) 06/02/2017   Chronic idiopathic constipation 03/19/2017   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   Permanent atrial fibrillation    Laryngeal stenosis 09/18/2016   Esophageal dysphagia    Polymyalgia rheumatica ([place]) 04/10/2015   Hypertrophic cardiomyopathy ([place]) 02/15/2015   Eczema 09/18/2014   Environmental allergies 09/18/2014   Pre-diabetes 11/26/2013   COPD, moderate ([place]) 11/01/2013   Complete heart block ([place]) 04/03/2011  Anemia 02/17/2011   AVM (arteriovenous malformation) 02/13/2011   PACEMAKER-[place] 11/28/2010   CKD (chronic kidney disease) stage 3, GFR 30-59 ml/min (HCC) 09/24/2010   Acute on chronic congestive heart failure ([place]) 08/30/2010   ARTHRITIS 07/24/2010   Generalized anxiety disorder 07/23/2010   Hyperlipidemia 02/11/2007   Essential hypertension 02/11/2007   APNEA, SLEEP 02/11/2007    Past Surgical History:  Procedure Laterality Date   CARDIAC CATHETERIZATION     CARDIAC CATHETERIZATION N/A 08/19/2016   Procedure: Right/Left Heart Cath and Coronary Angiography;  Surgeon: Jolaine Artist, MD;  Location: [place] CV LAB;  Service: Cardiovascular;  Laterality: N/A;    COLONOSCOPY WITH PROPOFOL N/A 06/16/2017   Procedure: COLONOSCOPY WITH PROPOFOL;  Surgeon: Mauri Pole, MD;  Location: [facility] ENDOSCOPY;  Service: Endoscopy;  Laterality: N/A;   DIRECT LARYNGOSCOPY N/A 09/18/2016   Procedure: DIRECT LARYNGOSCOPY, BRONCHOSCOPY, REMOVAL OF INTUBATION GRANULOMA;  Surgeon: [name], MD;  Location: [facility] OR;  Service: ENT;  Laterality: N/A;   DIRECT LARYNGOSCOPY N/A 10/20/2016   Procedure: EXTUBATION AND FLEXIBLE LARYNGOSCOPE;  Surgeon: [name], MD;  Location: [facility] OR;  Service: ENT;  Laterality: N/A;   DIRECT LARYNGOSCOPY N/A 10/29/2016   Procedure: DIRECT LARYNGOSCOPY;  Surgeon: [name], MD;  Location:  OR;  Service: ENT;  Laterality: N/A;   ESOPHAGOGASTRODUODENOSCOPY  12/23/2011   Procedure: ESOPHAGOGASTRODUODENOSCOPY (EGD);  Surgeon: [name], MD;  Location: [facility];  Service: Endoscopy;  Laterality: N/A;   ESOPHAGOGASTRODUODENOSCOPY (EGD) WITH PROPOFOL N/A 06/16/2017   Procedure: ESOPHAGOGASTRODUODENOSCOPY (EGD) WITH PROPOFOL;  Surgeon: Mauri Pole, MD;  Location: [facility] ENDOSCOPY;  Service: Endoscopy;  Laterality: N/A;   EXTUBATION (ENDOTRACHEAL) IN OR N/A 07/21/2016   Procedure: EXTUBATION (ENDOTRACHEAL) IN OR;  Surgeon: [name], MD;  Location: [place];  Service: ENT;  Laterality: N/A;   [place] STUDY  12/23/2011   Procedure: GIVENS CAPSULE STUDY;  Surgeon: [name], MD;  Location: [facility] ENDOSCOPY;  Service: Endoscopy;  Laterality: N/A;   MICROLARYNGOSCOPY WITH LASER N/A 10/16/2016   Procedure: MICRODIRECTLARYNGOSCOPY WITH LASER ABLATION AND KENLOG INJECTION;  Surgeon: [name], MD;  Location: [place];  Service: ENT;  Laterality: N/A;   [place], most recent gen change by JA 4/12   TRACHEOSTOMY TUBE PLACEMENT N/A 10/29/2016   Procedure: TRACHEOSTOMY;  Surgeon: [name], MD;  Location: [place];  Service: ENT;  Laterality: N/A;   TUBAL LIGATION  04/01/2000     OB History   No  obstetric history on file.      Home Medications    Prior to Admission medications   Medication Sig Start Date End Date Taking? Authorizing Provider  acetaminophen (TYLENOL) 650 MG CR tablet Take 650-1,300 mg by mouth every 8 (eight) hours as needed for pain.    [provider]  albuterol (PROVENTIL HFA;VENTOLIN HFA) 108 (90 Base) MCG/ACT inhaler Inhale 1-2 puffs into the lungs every 6 (six) hours as needed for wheezing or shortness of breath. 08/04/18   Riccio, Gardiner Rhyme, DO  allopurinol (ZYLOPRIM) 100 MG tablet Take 2 tablets (200 mg total) by mouth daily. 06/03/18   Zenia Resides, MD  ALPRAZolam (XANAX) 0.5 MG tablet TAKE 1 TABLET(0.5 MG) BY MOUTH DAILY AS NEEDED FOR ANXIETY Patient taking differently: Take 0.5 mg by mouth daily.  03/24/19   Steve Rattler, DO  apixaban (ELIQUIS) 5 MG TABS tablet Take 1 tablet (5 mg total) by mouth 2 (two) times daily. 05/11/19   Addison Naegeli  K, MD  bimatoprost (LUMIGAN) 0.01 % SOLN Place 1 drop into both eyes at bedtime.    [provider]  cetirizine (ZYRTEC) 10 MG tablet Take 10 mg by mouth daily.    [provider]  Cholecalciferol (VITAMIN D) 50 MCG (2000 UT) tablet Take 2,000 Units by mouth daily.    [provider]  ferrous sulfate 325 (65 FE) MG tablet TAKE 1 TABLET BY MOUTH TWICE DAILY WITH MEALS TO KEEP BLOOD COUNT UP Patient taking differently: Take 325 mg by mouth 2 (two) times daily with a meal.  08/26/16   Riccio, Gardiner Rhyme, DO  furosemide (LASIX) 40 MG tablet Take 1 tablet (40 mg total) by mouth 2 (two) times daily. 06/03/18   Zenia Resides, MD  guaiFENesin-dextromethorphan (ROBITUSSIN DM) 100-10 MG/5ML syrup Take 5 mLs by mouth every 4 (four) hours as needed for cough.    [provider]  ipratropium-albuterol (DUONEB) 0.5-2.5 (3) MG/3ML SOLN USE 3 ML VIA NEBULIZER EVERY 6 HOURS Patient taking differently: Take 3 mLs by nebulization every 6 (six) hours as needed (shortness of breath).  10/21/18    Steve Rattler, DO  magnesium hydroxide (MILK OF MAGNESIA) 400 MG/5ML suspension Take 60 mLs by mouth daily as needed for mild constipation.     [provider]  nitroGLYCERIN (NITROSTAT) 0.4 MG SL tablet Place 1 tablet (0.4 mg total) under the tongue every 5 (five) minutes as needed for chest pain. 08/04/18   Steve Rattler, DO  Olopatadine HCl 0.2 % SOLN Place 1-2 drops into both eyes daily as needed (itching).  02/17/18   [provider]  polyethylene glycol (MIRALAX / GLYCOLAX) packet Take 17 g by mouth daily as needed for mild constipation. 11/26/18   Steve Rattler, DO  potassium chloride (K-DUR) 10 MEQ tablet TAKE 1 TABLET BY MOUTH DAILY Patient taking differently: Take 10 mEq by mouth daily.  04/21/19   Steve Rattler, DO  predniSONE (DELTASONE) 2.5 MG tablet Take 3 tablets (7.5 mg total) by mouth daily with breakfast. 05/12/19   Benay Pike, MD  rosuvastatin (CRESTOR) 20 MG tablet Take 1 tablet (20 mg total) by mouth daily. Patient taking differently: Take 20 mg by mouth at bedtime.  06/03/18   Zenia Resides, MD  sodium chloride (OCEAN) 0.65 % SOLN nasal spray Place 1 spray into both nostrils daily as needed for congestion.     [provider]  triamcinolone cream (KENALOG) 0.1 % Apply 1 application topically 2 (two) times daily. Patient taking differently: Apply 1 application topically 2 (two) times daily as needed (rash).  06/03/18   Zenia Resides, MD    Family History Family History  Problem Relation Age of Onset   Hypertension Mother    Stroke Mother    Heart disease Mother    Aneurysm Mother    CVA Mother    Hypertension Father    Stroke Father    Heart attack Father    Alcohol abuse Father    Hypertension Sister    Hypertension Brother    Colon cancer Brother    Cancer Brother    Diabetes Sister    Diabetes Brother    Heart attack Brother    Heart attack Brother    Heart disease Sister    Alcohol abuse  Brother    Heart disease Daughter     Social History Social History   Tobacco Use   Smoking status: Former Smoker    Packs/day: 0.10  Years: 50.00    Pack years: 5.00    Types: Cigarettes    Last attempt to quit: 08/16/2011    Years since quitting: 7.7   Smokeless tobacco: Never Used   Tobacco comment: quit 2012  Substance Use Topics   Alcohol use: No    Comment: quit 08/16/2011   Drug use: No     Allergies   Ace inhibitors and Other   Review of Systems Review of Systems  Constitutional: Negative for fever.  Respiratory: Positive for cough (chronic, unchanged) and shortness of breath.   Cardiovascular: Negative for leg swelling.  All other systems reviewed and are negative.    Physical Exam Updated Vital Signs Pulse 70    Temp 98.6 F (37 C) (Oral)    Ht 5\' 3"  (1.6 m)    Wt 81 kg    SpO2 100%    BMI 31.63 kg/m   Physical Exam Vitals signs and nursing note reviewed.  Constitutional:      General: She is not in acute distress.    Appearance: She is well-developed. She is not ill-appearing or diaphoretic.  HENT:     Head: Normocephalic and atraumatic.     Right Ear: External ear normal.     Left Ear: External ear normal.     Nose: Nose normal.  Eyes:     General:        Right eye: No discharge.        Left eye: No discharge.  Cardiovascular:     Rate and Rhythm: Normal rate and regular rhythm.     Heart sounds: Normal heart sounds.  Pulmonary:     Effort: Pulmonary effort is normal. Tachypnea present. No accessory muscle usage or respiratory distress.     Breath sounds: Rhonchi present.  Abdominal:     Palpations: Abdomen is soft.     Tenderness: There is no abdominal tenderness.  Musculoskeletal:     Right lower leg: No edema.     Left lower leg: No edema.  Skin:    General: Skin is warm and dry.  Neurological:     Mental Status: She is alert.  Psychiatric:        Mood and Affect: Mood is not anxious.      ED Treatments / Results   Labs (all labs ordered are listed, but only abnormal results are displayed) Labs Reviewed  [place]  TROPONIN I  CBC WITH DIFFERENTIAL/PLATELET  D-DIMER, QUANTITATIVE (NOT AT [facility])    EKG EKG Interpretation  Date/Time:  Sunday May 15 2019 16:28:14 EDT Ventricular Rate:  70 PR Interval:    QRS Duration: 92 QT Interval:  395 QTC Calculation: 427 R Axis:   -72 Text Interpretation:  A-V dual-paced rhythm with some inhibition No further analysis attempted due to paced rhythm no significant change since May 08 2019 Confirmed by Sherwood Gambler [phone]) on 05/15/2019 4:36:06 PM   Radiology No results found.  Procedures Procedures (including critical care time)  Medications Ordered in ED Medications  albuterol (VENTOLIN HFA) 108 (90 Base) MCG/ACT inhaler 4 puff (has no administration in time range)     Initial Impression / Assessment and Plan / ED Course  I have reviewed the triage vital signs and the nursing notes.  Pertinent labs & imaging results that were available during my care of the patient were reviewed by me and considered in my medical decision making (see chart for details).  The history is somewhat limited given the patient's dysarthria.  However she has been having on and off chest pain.  Initial troponin 0.14 which is similar to prior.  However with her on and off chest pain and significant risk factors/comorbidities she will be admitted for ACS rule out and observation.  Family practice to admit.  I think PE is unlikely with a negative d-dimer in this setting.  Albuterol did not change her shortness of breath.  Final Clinical Impressions(s) / ED Diagnoses   Final diagnoses:  None    ED Discharge Orders    None       Sherwood Gambler, MD 05/16/19 [phone]

## 2019-05-15 NOTE — Telephone Encounter (Signed)
Patient now on Eliquis which is needed for anticoagulation, but as she is also a risk for bleeding we are advising that she stop aspirin at this time.  Patient called and notified with her son also on the phone.  They understand and will stop taking aspirin immediately.  No new complaints.  -Dr. Criss Rosales

## 2019-05-16 ENCOUNTER — Ambulatory Visit: Payer: Medicare Other | Admitting: Family Medicine

## 2019-05-16 ENCOUNTER — Other Ambulatory Visit: Payer: Self-pay

## 2019-05-16 DIAGNOSIS — I69320 Aphasia following cerebral infarction: Secondary | ICD-10-CM | POA: Diagnosis not present

## 2019-05-16 DIAGNOSIS — R0789 Other chest pain: Secondary | ICD-10-CM | POA: Diagnosis not present

## 2019-05-16 DIAGNOSIS — G8929 Other chronic pain: Secondary | ICD-10-CM | POA: Diagnosis not present

## 2019-05-16 DIAGNOSIS — Z1159 Encounter for screening for other viral diseases: Secondary | ICD-10-CM | POA: Diagnosis not present

## 2019-05-16 DIAGNOSIS — R0602 Shortness of breath: Secondary | ICD-10-CM | POA: Diagnosis not present

## 2019-05-16 DIAGNOSIS — I4821 Permanent atrial fibrillation: Secondary | ICD-10-CM

## 2019-05-16 DIAGNOSIS — Z930 Tracheostomy status: Secondary | ICD-10-CM | POA: Diagnosis not present

## 2019-05-16 DIAGNOSIS — R079 Chest pain, unspecified: Secondary | ICD-10-CM | POA: Diagnosis not present

## 2019-05-16 LAB — CBC
HCT: 28.6 % — ABNORMAL LOW (ref 36.0–46.0)
Hemoglobin: 9.1 g/dL — ABNORMAL LOW (ref 12.0–15.0)
MCH: 29.4 pg (ref 26.0–34.0)
MCHC: 31.8 g/dL (ref 30.0–36.0)
MCV: 92.3 fL (ref 80.0–100.0)
Platelets: 239 10*3/uL (ref 150–400)
RBC: 3.1 MIL/uL — ABNORMAL LOW (ref 3.87–5.11)
RDW: 14.8 % (ref 11.5–15.5)
WBC: 5.7 10*3/uL (ref 4.0–10.5)
nRBC: 0 % (ref 0.0–0.2)

## 2019-05-16 LAB — BASIC METABOLIC PANEL
Anion gap: 13 (ref 5–15)
BUN: 55 mg/dL — ABNORMAL HIGH (ref 8–23)
CO2: 26 mmol/L (ref 22–32)
Calcium: 10.1 mg/dL (ref 8.9–10.3)
Chloride: 98 mmol/L (ref 98–111)
Creatinine, Ser: 1.63 mg/dL — ABNORMAL HIGH (ref 0.44–1.00)
GFR calc Af Amer: 35 mL/min — ABNORMAL LOW (ref >60)
GFR calc non Af Amer: 31 mL/min — ABNORMAL LOW (ref >60)
Glucose, Bld: 107 mg/dL — ABNORMAL HIGH (ref 70–99)
Potassium: 3.8 mmol/L (ref 3.5–5.1)
Sodium: 137 mmol/L (ref 135–145)

## 2019-05-16 LAB — TROPONIN I
Troponin I: 0.14 ng/mL (ref <0.03)
Troponin I: 0.15 ng/mL (ref <0.03)
Troponin I: 0.13 ng/mL (ref <0.03)

## 2019-05-16 LAB — PROTIME-INR
INR: 1.5 — ABNORMAL HIGH (ref 0.8–1.2)
Prothrombin Time: 17.9 seconds — ABNORMAL HIGH (ref 11.4–15.2)

## 2019-05-16 NOTE — Evaluation (Signed)
Physical Therapy Evaluation Patient Details Name: [name] MRN: [id number] DOB: 1946/01/14 Today's Date: 05/16/2019   History of Present Illness  73 y.o. female presenting with worsening SOB and chest pain. PMH is significant for recent CVA on 5/24 with residual expressive aphasia, a-fib s/p PPM 1998, COPD, PMR, HLD, HTN, gout, GERD, CHF, CKDIII, Anemia with AVMs of stomach, OSA, traceostomy, remote history of alcohol abuse  Clinical Impression  Pt pleasant and reports limited activity tolerance and feeling poorly. Pt with decreased gait and balance with limited cognitive eval due to aphasia. Pt with varied reports of home environment and function during session and would benefit from further balance assessment and stair training to maximize independence and decrease fall risk. Pt moving well in room and hall but limitations of balance evident with pt reaching for environmental support.      Follow Up Recommendations Home health PT;Supervision/Assistance - 24 hour    Equipment Recommendations  None recommended by PT    Recommendations for Other Services       Precautions / Restrictions Precautions Precautions: Fall Precaution Comments: Chronic trach with PMV safety-pinned to gown Restrictions Weight Bearing Restrictions: No      Mobility  Bed Mobility Overal bed mobility: Modified Independent             General bed mobility comments: Pt received ambulating around room without staff present.   Transfers Overall transfer level: Modified independent                  Ambulation/Gait Ambulation/Gait assistance: Supervision Gait Distance (Feet): 250 Feet Assistive device: None Gait Pattern/deviations: Step-through pattern;Decreased stride length   Gait velocity interpretation: >2.62 ft/sec, indicative of community ambulatory General Gait Details: pt at times reaching out for environmental support but did appear to truly need it vs habit. Directional  cues to return to room. generally steady with hall ambulation  Stairs            Wheelchair Mobility    Modified Rankin (Stroke Patients Only)       Balance Overall balance assessment: Needs assistance Sitting-balance support: Feet supported;No upper extremity supported Sitting balance-Leahy Scale: Good     Standing balance support: No upper extremity supported;During functional activity Standing balance-Leahy Scale: Good                               Pertinent Vitals/Pain Pain Assessment: No/denies pain    Home Living Family/patient expects to be discharged to:: Private residence Living Arrangements: Children Available Help at Discharge: Family;Available 24 hours/day Type of Home: House Home Access: Level entry     Home Layout: One level Home Equipment: Walker - 2 wheels;Bedside commode Additional Comments: pt reports there are stairs from the road but that her son drives her to the door    Prior Function Level of Independence: Needs assistance   Gait / Transfers Assistance Needed: independent with mobility   ADL's / Homemaking Assistance Needed: Reports independent with ADLs. Pt states she can typically ambulate around the grocery store and get everything she needs without AD.         Hand Dominance        Extremity/Trunk Assessment   Upper Extremity Assessment Upper Extremity Assessment: Overall WFL for tasks assessed    Lower Extremity Assessment Lower Extremity Assessment: Overall WFL for tasks assessed    Cervical / Trunk Assessment Cervical / Trunk Assessment: Normal  Communication   Communication: Expressive difficulties  Cognition Arousal/Alertness: Awake/alert Behavior During Therapy: WFL for tasks assessed/performed Overall Cognitive Status: Difficult to assess                                 General Comments: Oriented, but expressive aphasia is limiting full cognitive evaluation      General Comments       Exercises     Assessment/Plan    PT Assessment Patient needs continued PT services  PT Problem List Decreased strength;Decreased activity tolerance;Decreased balance;Decreased mobility;Decreased knowledge of use of DME;Decreased safety awareness;Decreased knowledge of precautions;Cardiopulmonary status limiting activity       PT Treatment Interventions DME instruction;Gait training;Stair training;Functional mobility training;Therapeutic activities;Therapeutic exercise;Neuromuscular re-education;Patient/family education    PT Goals (Current goals can be found in the Care Plan section)  Acute Rehab PT Goals Patient Stated Goal: go home PT Goal Formulation: With patient Time For Goal Achievement: 05/30/19 Potential to Achieve Goals: Good    Frequency Min 3X/week   Barriers to discharge        Co-evaluation               AM-PAC PT "6 Clicks" Mobility  Outcome Measure Help needed turning from your back to your side while in a flat bed without using bedrails?: None Help needed moving from lying on your back to sitting on the side of a flat bed without using bedrails?: None Help needed moving to and from a bed to a chair (including a wheelchair)?: None Help needed standing up from a chair using your arms (e.g., wheelchair or bedside chair)?: A Little Help needed to walk in hospital room?: A Little Help needed climbing 3-5 steps with a railing? : A Little 6 Click Score: 21    End of Session Equipment Utilized During Treatment: Gait belt Activity Tolerance: Patient tolerated treatment well Patient left: in chair;with call bell/phone within reach Nurse Communication: Mobility status PT Visit Diagnosis: Unsteadiness on feet (R26.81);Other symptoms and signs involving the nervous system (R29.898)    Time: 3646-8032 PT Time Calculation (min) (ACUTE ONLY): 10 min   Charges:   PT Evaluation $PT Eval Moderate Complexity: 1 Mod          [place], PT Acute  Rehabilitation Services Pager: [phone] Office: [phone]   Sandy Salaam Maris Abascal 05/16/2019, 12:37 PM

## 2019-05-16 NOTE — Progress Notes (Signed)
Spoke with patient's son Madalina Rosman with pt's permission. Updated Mike Gip on pt's plan of care.  questions and concerns were answered.

## 2019-05-16 NOTE — Patient Outreach (Signed)
[facility]) Care Management  05/16/2019  Inessa Wardrop [birth year] [phone]  EMMI: stroke red alert Referral date: 05/16/19 Referral reason: new problems walking/ talking/ speaking/ seeing: Yes Insurance:  [employer] Day # 1  Telephone call to patient's son and designated party release Enis Leatherwood.  HIPAA verified by son for patient.  RNCM explained reason for call. Son states patient has not had any new problems walking/ talking/ speaking or seeing. Son states patient was taken to the hospital on yesterday and admitted due to breathing difficulties. Son states her heart is being monitored.  RNCM offered to contact son and/or patient within 4 business days for follow up. Son verbally agreed.   Son also verbally agreed to discontinue to [facility] stroke calls at this time.   PLAN: RNCM will follow up with patient within 4 business days.  RNCM will have EMMI stroke automated phone calls discontinued due to patient being readmitted to the hospital.   Quinn Plowman RN,BSN,CCM [facility] Telephonic  [phone]

## 2019-05-16 NOTE — Consult Note (Addendum)
Cardiology Consultation:   Patient ID: [name] MRN: [id number]; DOB: 03-01-1946  Admit date: 05/15/2019 Date of Consult: 05/16/2019  Primary Care Provider: Steve Rattler, DO Primary Cardiologist: Skeet Latch, MD  Primary Electrophysiologist:  Thompson Grayer, MD    Patient Profile:   [name] is a 73 y.o. female with a hx of CHB s/p PPM, Permanent afib, HTN, diastolic HF, depression, GI bleed, HL, OSA/laryngeal stenosis s/p trach who is being seen today for the evaluation of chest pain at the request of Dr. Mingo Amber.  History of Present Illness:   [name] is a 73 yo female with PMH noted above. She is followed by [name] and Dr. Rayann Heman as an outpatient. Underwent cardiac cath back in 2017 with normal coronaries and mild to moderate pulmonary HTN. Dx with CHB back in 1998 and had a PPM placed. Also with known hx of permanent Afib, but has not been on [place] in that past 2/2 to GI bleed. She was last seen in the office on 10/19 with Dr. Rayann Heman. Pacemaker was functioning normal, and no changes were made to her medications. Last echo was 05/09/19 with normal EF, severely increased left ventricular wall thickness consistent with hypertrophic cardiomyopathy.   She was recently admitted several weeks prior to this admission with a cardioembolic stroke and placed on Eliquis 5mg  BID. Noted to have residual expressive aphasia during that admission.   Presented back and admitted on 05/15/19 with reports of shortness of breath and chest pain. Troponin was noted to be elevated and admitted for observation. In talking with the patient it is difficult to get a good history as a result of her recent CVA with aphasia. She does report some episodes of chest pain but describes as intermittent in several different areas of her upper and lower chest. Not associated with rest or activity. Main complaint seems to be her trach and troubling breathing. In review of labs, troponin  is chronically elevated and actually noted lower then it was around the time of her cath back in 2017. EKG shows paced rhythm. Primary has been attempting to alleviate respiratory issues with suctioning.   Past Medical History:  Diagnosis Date  . Anemia   . Angina   . Angioedema    2/2 ACE  . Arteriovenous malformation of stomach   . Arthritis   . Asthma   . AVM (arteriovenous malformation) of colon    small intestine; stomach  . Blood transfusion   . CHF (congestive heart failure) ([place])   . Complete heart block ([place])    s/p PPM 1998  . Complication of anesthesia    Difficult airway; anaphylaxis and swelling with propofol  . DDD (degenerative disc disease)   . Depression   . Diastolic heart failure   . Fatty liver 07/26/10  . GERD (gastroesophageal reflux disease)   . GI bleed   . Headache   . History of alcohol abuse Stopped Fall 2012  . History of tobacco use Quit Fall 2012  . Hx of cardiovascular stress test    a. Lexiscan Myoview (10/15):  Small inferolateral and apical defect c/w scar and poss soft tissue attenuation, no ischemia, EF 42%  . Hx of colonic polyp 08/13/10   adenomatous  . Hx of colonoscopy   . Hyperlipidemia   . Hyperlipidemia   . Hypertension   . Hypertrophic cardiomyopathy ([place])    dx by Dr [name] 2009  . Iron deficiency anemia   . Myocardial infarction ([place])   . Panic  attack   . Panic attacks   . Permanent atrial fibrillation   . Pneumonia   . Renal failure    baseline creatinine 1.6  . Right arm pain 01/08/2012  . RLS (restless legs syndrome)    Dx 06/2007  . Shortness of breath    sob on exertation  . Sleep apnea     Past Surgical History:  Procedure Laterality Date  . CARDIAC CATHETERIZATION    . CARDIAC CATHETERIZATION N/A 08/19/2016   Procedure: Right/Left Heart Cath and Coronary Angiography;  Surgeon: Jolaine Artist, MD;  Location: [facility];  Service: Cardiovascular;  Laterality: N/A;  . COLONOSCOPY WITH PROPOFOL N/A  06/16/2017   Procedure: COLONOSCOPY WITH PROPOFOL;  Surgeon: Mauri Pole, MD;  Location: [facility];  Service: Endoscopy;  Laterality: N/A;  . DIRECT LARYNGOSCOPY N/A 09/18/2016   Procedure: DIRECT LARYNGOSCOPY, BRONCHOSCOPY, REMOVAL OF INTUBATION GRANULOMA;  Surgeon: [name], MD;  [facility];  Service: ENT;  Laterality: N/A;  . DIRECT LARYNGOSCOPY N/A 10/20/2016   Procedure: EXTUBATION AND FLEXIBLE LARYNGOSCOPE;  Surgeon: [name], MD;  Location: [place];  Service: ENT;  Laterality: N/A;  . DIRECT LARYNGOSCOPY N/A 10/29/2016   Procedure: DIRECT LARYNGOSCOPY;  Surgeon: [name], MD;  Location: [facility] OR;  Service: ENT;  Laterality: N/A;  . ESOPHAGOGASTRODUODENOSCOPY  12/23/2011   Procedure: ESOPHAGOGASTRODUODENOSCOPY (EGD);  Surgeon: [name], MD;  Location: [facility];  Service: Endoscopy;  Laterality: N/A;  . ESOPHAGOGASTRODUODENOSCOPY (EGD) WITH PROPOFOL N/A 06/16/2017   Procedure: ESOPHAGOGASTRODUODENOSCOPY (EGD) WITH PROPOFOL;  Surgeon: Mauri Pole, MD;  Location: [facility];  Service: Endoscopy;  Laterality: N/A;  . EXTUBATION (ENDOTRACHEAL) IN OR N/A 07/21/2016   Procedure: EXTUBATION (ENDOTRACHEAL) IN OR;  Surgeon: [name], MD;  Location: [facility];  Service: ENT;  Laterality: N/A;  . GIVENS CAPSULE STUDY  12/23/2011   Procedure: GIVENS CAPSULE STUDY;  Surgeon: [name], MD;  Location: [facility];  Service: Endoscopy;  Laterality: N/A;  . MICROLARYNGOSCOPY WITH LASER N/A 10/16/2016   Procedure: MICRODIRECTLARYNGOSCOPY WITH LASER ABLATION AND KENLOG INJECTION;  Surgeon: [name], MD;  Location: [place];  Service: ENT;  Laterality: N/A;  . PACEMAKER INSERTION  1998   st jude, most recent gen change by [name] 4/12  . TRACHEOSTOMY TUBE PLACEMENT N/A 10/29/2016   Procedure: TRACHEOSTOMY;  Surgeon: [name], MD;  Location: [place];  Service: ENT;  Laterality: N/A;  . TUBAL LIGATION  04/01/2000     Home Medications:  Prior to Admission medications    Medication Sig Start Date End Date Taking? Authorizing Provider  acetaminophen (TYLENOL) 650 MG CR tablet Take 650-1,300 mg by mouth every 8 (eight) hours as needed for pain.   Yes [provider]  albuterol (PROVENTIL HFA;VENTOLIN HFA) 108 (90 Base) MCG/ACT inhaler Inhale 1-2 puffs into the lungs every 6 (six) hours as needed for wheezing or shortness of breath. 08/04/18  Yes Riccio, Angela C, DO  allopurinol (ZYLOPRIM) 100 MG tablet Take 2 tablets (200 mg total) by mouth daily. 06/03/18  Yes Hensel, Jamal Collin, MD  ALPRAZolam (XANAX) 0.5 MG tablet TAKE 1 TABLET(0.5 MG) BY MOUTH DAILY AS NEEDED FOR ANXIETY Patient taking differently: Take 0.5 mg by mouth daily.  03/24/19  Yes Riccio, Gardiner Rhyme, DO  apixaban (ELIQUIS) 5 MG TABS tablet Take 1 tablet (5 mg total) by mouth 2 (two) times daily. 05/11/19  Yes Benay Pike, MD  bimatoprost (LUMIGAN) 0.01 % SOLN Place 1 drop into both eyes at  bedtime.   Yes [provider]  cetirizine (ZYRTEC) 10 MG tablet Take 10 mg by mouth daily.   Yes [provider]  Cholecalciferol (VITAMIN D) 50 MCG (2000 UT) tablet Take 2,000 Units by mouth daily.   Yes [provider]  ferrous sulfate 325 (65 FE) MG tablet TAKE 1 TABLET BY MOUTH TWICE DAILY WITH MEALS TO KEEP BLOOD COUNT UP Patient taking differently: Take 325 mg by mouth 2 (two) times daily with a meal.  08/26/16  Yes Riccio, Angela C, DO  furosemide (LASIX) 40 MG tablet Take 1 tablet (40 mg total) by mouth 2 (two) times daily. 06/03/18  Yes Hensel, Jamal Collin, MD  guaiFENesin-dextromethorphan (ROBITUSSIN DM) 100-10 MG/5ML syrup Take 5 mLs by mouth every 4 (four) hours as needed for cough.   Yes [provider]  ipratropium-albuterol (DUONEB) 0.5-2.5 (3) MG/3ML SOLN USE 3 ML VIA NEBULIZER EVERY 6 HOURS Patient taking differently: Take 3 mLs by nebulization every 6 (six) hours as needed (shortness of breath).  10/21/18  Yes Riccio, Levada Dy C, DO  magnesium hydroxide (MILK OF  MAGNESIA) 400 MG/5ML suspension Take 60 mLs by mouth daily as needed for mild constipation.    Yes [provider]  nitroGLYCERIN (NITROSTAT) 0.4 MG SL tablet Place 1 tablet (0.4 mg total) under the tongue every 5 (five) minutes as needed for chest pain. 08/04/18  Yes Riccio, Angela C, DO  Olopatadine HCl 0.2 % SOLN Place 1-2 drops into both eyes daily as needed (itching).  02/17/18  Yes [provider]  polyethylene glycol (MIRALAX / GLYCOLAX) packet Take 17 g by mouth daily as needed for mild constipation. 11/26/18  Yes Riccio, Angela C, DO  potassium chloride (K-DUR) 10 MEQ tablet TAKE 1 TABLET BY MOUTH DAILY Patient taking differently: Take 10 mEq by mouth daily.  04/21/19  Yes Steve Rattler, DO  predniSONE (DELTASONE) 2.5 MG tablet Take 3 tablets (7.5 mg total) by mouth daily with breakfast. 05/12/19  Yes Benay Pike, MD  rosuvastatin (CRESTOR) 20 MG tablet Take 1 tablet (20 mg total) by mouth daily. Patient taking differently: Take 20 mg by mouth at bedtime.  06/03/18  Yes Hensel, Jamal Collin, MD  sodium chloride (OCEAN) 0.65 % SOLN nasal spray Place 1 spray into both nostrils daily as needed for congestion.    Yes [provider]  triamcinolone cream (KENALOG) 0.1 % Apply 1 application topically 2 (two) times daily. Patient taking differently: Apply 1 application topically 2 (two) times daily as needed (rash).  06/03/18  Yes Zenia Resides, MD    Inpatient Medications: Scheduled Meds: . allopurinol  200 mg Oral Daily  . apixaban  5 mg Oral BID  . ferrous sulfate  325 mg Oral BID WC  . furosemide  40 mg Oral BID  . loratadine  10 mg Oral Daily  . potassium chloride  10 mEq Oral Daily  . predniSONE  7.5 mg Oral Q breakfast  . rosuvastatin  20 mg Oral QHS   Continuous Infusions:  PRN Meds: acetaminophen, ALPRAZolam, nitroGLYCERIN, ondansetron (ZOFRAN) IV  Allergies:    Allergies  Allergen Reactions  . Ace Inhibitors Swelling    angiodema  . Other  Anaphylaxis and Swelling    Reaction to unspecified anesthesia     Social History:   Social History   Socioeconomic History  . Marital status: Divorced    Spouse name: Not on file  . Number of children: 4  . Years of education: 9  .  Highest education level: Not on file  Occupational History  . Occupation: disabled    Fish farm manager: UNEMPLOYED  . Occupation: previously- Development worker, community  Social Needs  . Financial resource strain: Not on file  . Food insecurity:    Worry: Not on file    Inability: Not on file  . Transportation needs:    Medical: Not on file    Non-medical: Not on file  Tobacco Use  . Smoking status: Former Smoker    Packs/day: 0.10    Years: 50.00    Pack years: 5.00    Types: Cigarettes    Last attempt to quit: 08/16/2011    Years since quitting: 7.7  . Smokeless tobacco: Never Used  . Tobacco comment: quit 2012  Substance and Sexual Activity  . Alcohol use: No    Comment: quit 08/16/2011  . Drug use: No  . Sexual activity: Never  Lifestyle  . Physical activity:    Days per week: Not on file    Minutes per session: Not on file  . Stress: Not on file  Relationships  . Social connections:    Talks on phone: Not on file    Gets together: Not on file    Attends religious service: Not on file    Active member of club or organization: Not on file    Attends meetings of clubs or organizations: Not on file    Relationship status: Not on file  . Intimate partner violence:    Fear of current or ex partner: Not on file    Emotionally abused: Not on file    Physically abused: Not on file    Forced sexual activity: Not on file  Other Topics Concern  . Not on file  Social History Narrative   On disability for heart failure/pacemaker.     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    4 grown children,  8 grandchildren.     Drinks alcohol a few times a week socially. At one time, the most she reports drinking is about 3 mixed drinks.   Lives with  daughter [name] and son. Will be moving 04/2011 to different house because concerned children are taking advantage of her financial situation.    No illicit drugs.   Smokes few cigarettes daily, especially when stressed. Started smoking @ age 60. Quit January 2012 2/2 health but started smoking again 02/2011.       Health Care POA:    Emergency Contact: daughter, Verdene Lennert [phone]   End of Life Plan: gave pt AD info pamphlet   Who lives with you: no one- lives in section 8 housing   Any pets: none   Diet: Pt has a varied diet of protein, starch and vegetables   Exercise: Pt has no regular exercise routine.   Seatbelts: Pt reports wearing seatbelt when in vehicles.    Hobbies: dancing    Family History:    Family History  Problem Relation Age of Onset  . Hypertension Mother   . Stroke Mother   . Heart disease Mother   . Aneurysm Mother   . CVA Mother   . Hypertension Father   . Stroke Father   . Heart attack Father   . Alcohol abuse Father   . Hypertension Sister   . Hypertension Brother   . Colon cancer Brother   . Cancer Brother   . Diabetes Sister   . Diabetes Brother   . Heart attack Brother   . Heart attack  Brother   . Heart disease Sister   . Alcohol abuse Brother   . Heart disease Daughter      ROS:  Please see the history of present illness.   All other ROS reviewed and negative.     Physical Exam/Data:   Vitals:   05/16/19 0920 05/16/19 0942 05/16/19 1100 05/16/19 1216  BP: 114/75   119/79  Pulse: 68 73 71 70  Resp:  18 18 18   Temp:    97.6 F (36.4 C)  TempSrc:    Oral  SpO2:  96% 99% 100%  Weight:      Height:        Intake/Output Summary (Last 24 hours) at 05/16/2019 1515 Last data filed at 05/16/2019 1236 Gross per 24 hour  Intake 720 ml  Output 600 ml  Net 120 ml   Last 3 Weights 05/16/2019 05/15/2019 05/15/2019  Weight (lbs) 156 lb 3.2 oz 157 lb 178 lb 9.2 oz  Weight (kg) 70.852 kg 71.215 kg 81 kg     Body mass index is 27.67 kg/m.   General:  Well nourished, well developed, in no acute distress. HEENT: trach Neck: no JVD Endocrine:  No thryomegaly Vascular: No carotid bruits; FA pulses 2+ bilaterally without bruits  Cardiac:  normal S1, S2; RRR; + murmur  Lungs:  clear to auscultation bilaterally, no wheezing, rhonchi or rales  Abd: soft, nontender, no hepatomegaly  Ext: no edema Musculoskeletal:  No deformities, BUE and BLE strength normal and equal Skin: warm and dry  Neuro:  CNs 2-12 intact, no focal abnormalities noted Psych:  Normal affect   EKG:  The EKG was personally reviewed and demonstrates:  V paced  Relevant CV Studies:  Cath: 9/17  Findings:  Ao = 145/79 (105) LV = 139/10/18 RA = 7 RV = 68/9 PA = 64/18 (38) PCW = 22 (v waves to 37) Fick cardiac output/index = 5.4/2.9 PVR = 3.0 WU Ao sat = 94% PA sat = 59%, 59%  Assessment: 1. Normal coronary arteries 2. Mild to moderate pulmonary HTN with normal PVR (mostly left-sided)  3. Prominent v-waves in PCWP tracing suggestive of severe diastolic dysfunction or significant mitral regurgitation  4. Normal LV function and cardiac output  Plan/Discussion:  Continue medical therapy. Optimize diuretic regimen. Review echo to assess degree of MR.   Bensimhon, Daniel,MD 1:22 PM  Echo: 05/09/19  IMPRESSIONS   1. The left ventricle has hyperdynamic systolic function, with an ejection fraction of >65%. The cavity size was normal. There is severely increased left ventricular wall thickness. Findings are consistent with hypertrophic cardiomyopathy. Left  ventricular diastolic Doppler parameters are consistent with pseudonormalization.  2. The right ventricle has normal systolic function. The cavity was normal. There is no increase in right ventricular wall thickness.  3. Left atrial size was moderately dilated.  4. Right atrial size was moderately dilated.  5. Mitral valve regurgitation is moderate by color flow Doppler.  6. The aortic valve  is tricuspid. Mild thickening of the aortic valve. Mild calcification of the aortic valve.  Laboratory Data:  Chemistry Recent Labs  Lab 05/11/19 0352 05/15/19 1645 05/16/19 0259  NA 138 135 137  K 3.5 4.2 3.8  CL 98 95* 98  CO2 27 24 26   GLUCOSE 99 147* 107*  BUN 38* 52* 55*  CREATININE 1.82* 1.62* 1.63*  CALCIUM 10.2 10.8* 10.1  GFRNONAA 27* 31* 31*  GFRAA 31* 36* 35*  ANIONGAP 13 16* 13    No results for input(s):  PROT, ALBUMIN, AST, ALT, ALKPHOS, BILITOT in the last 168 hours. Hematology Recent Labs  Lab 05/11/19 0352 05/15/19 1645 05/16/19 0259  WBC 4.0 7.3 5.7  RBC 3.30* 3.23* 3.10*  HGB 10.0* 9.6* 9.1*  HCT 31.0* 31.1* 28.6*  MCV 93.9 96.3 92.3  MCH 30.3 29.7 29.4  MCHC 32.3 30.9 31.8  RDW 14.8 14.8 14.8  PLT 213 242 239   Cardiac Enzymes Recent Labs  Lab 05/15/19 1645 05/15/19 2306 05/16/19 0259 05/16/19 0840  TROPONINI 0.14* 0.14* 0.15* 0.13*   No results for input(s): TROPIPOC in the last 168 hours.  BNP Recent Labs  Lab 05/15/19 1645  BNP 158.6*    DDimer  Recent Labs  Lab 05/15/19 1645  DDIMER 0.39    Radiology/Studies:  Dg Chest Portable 1 View  Result Date: 05/15/2019 CLINICAL DATA:  Acute onset dyspnea today. EXAM: PORTABLE CHEST 1 VIEW COMPARISON:  CT chest and single view of the chest 05/08/2019. FINDINGS: Lungs clear. There is cardiomegaly without edema. No pneumothorax or pleural effusion. Pacing device and tracheostomy tube again seen. IMPRESSION: No acute disease. Electronically Signed   By: Inge Rise M.D.   On: 05/15/2019 17:18    Assessment and Plan:   Totiana Everson is a 73 y.o. female with a hx of CBH s/p PPM, Permanent afib, HTN, diastolic HF, depression, GI bleed, HL, OSA/laryngeal stenosis s/p trach who is being seen today for the evaluation of chest pain at the request of Dr. Mingo Amber.  1. Chest pain: pain seems very atypical in nature. Troponin has been chronically elevated since the time of her cath back  in 2017. Noted to have normal coronaries at that time. EKG with v pacing. Most recent echo with normal EF, and moderate mitral regurg. At this time, would not anticipate further cardiac work up.   2. Dyspnea/Trach: reports she has been struggling with her trach for awhile. Followed by ENT and they have been attempting to downsize. Ddimer negative on admission. She reports suctioning at home but does not get much relief with this. Primary managing.   3. CVA: recent admission with new CVA. Placed on Eliquis 5mg  BID with H/H stable.   4. Permanent Afib: known hx of the same. Has not OAC historically 2/2 hx of GI bleed. Currently tolerating addition of [place].   5. CHB s/p PPM: followed by Dr. Rayann Heman.    For questions or updates, please contact [place] Please consult [email or web address] for contact info under     Signed, Reino Bellis, NP  05/16/2019 3:15 PM   I have personally seen and examined this patient with Reino Bellis, NP. I agree with the assessment and plan as outlined above. [name] is admitted with chest pain and dyspnea. She has a chronically elevated troponin for the last 3 years. Normal cardiac cath in 2017 with no evidence of CAD. Echo 1 week ago with normal LV systolic function. EKG is paced.  Her pain is located at different spots over her right and left chest wall.  My exam: General: Well developed, well nourished, NAD  HEENT: OP clear, mucus membranes moist  SKIN: warm, dry. No rashes. Neuro: No focal deficits  Musculoskeletal: Muscle strength 5/5 all ext  Psychiatric: Mood and affect normal  Neck: No JVD, no carotid bruits, no thyromegaly, no lymphadenopathy.  Lungs:Clear bilaterally, no wheezes, rhonci, crackles Cardiovascular: Regular rate and rhythm. No murmurs, gallops or rubs. Abdomen:Soft. Bowel sounds present. Non-tender.  Extremities: No lower extremity edema. Pulses are 2 + in the bilateral  DP/PT.  Plan: Her pain is atypical. Her troponin has been  elevated for years, actually lower over the past 2 days than over the past 3 years. Recent normal LVEF by echo last week. It is unlikely that she has developed obstructive CAD since her normal cath in 2017. No further ischemic workup. OK to d/c home from a cardiac perspective.   Lauree Chandler 05/16/2019 4:47 PM

## 2019-05-16 NOTE — Progress Notes (Signed)
PT Cancellation Note  Patient Details Name: [name] MRN: [id number] DOB: 06/05/1946   Cancelled Treatment:    Reason Eval/Treat Not Completed: Patient declined, no reason specified(pt reported fatigue and unwilling to mobilize at this time with pending EKG. Will reatempt)   [name] B [name] 05/16/2019, 9:00 AM  Elwyn Reach, PT Acute Rehabilitation Services Pager: [phone] Office: [phone]

## 2019-05-16 NOTE — Progress Notes (Signed)
PIV consult: Arrived to room, pt stated she no longer needs IV since she is going home. Discussed with Velia, RN: nurse will place stat consult if IV meds are needed.

## 2019-05-16 NOTE — Progress Notes (Signed)
Called patient's son Breeanna Galgano to let him know that patient was not going home at this moment.  Respiratory therapist aware that patients needs to be suction.  Will page MD to let him know how patient feels after suctioning.  Patient is currently in the bathroom.  Patients bottle of medicine is in patients belongings bag: Eliquis and prednisone, glasses , notebooks and discharge packet.  Patient states that wants to keep medication in her bag instead of taking it to pharmacy.

## 2019-05-16 NOTE — Discharge Summary (Signed)
[name] Discharge Summary  Patient name: [name] Medical record number: [id number] Date of birth: 14-Nov-1946 Age: 73 y.o. Gender: female Date of Admission: 05/15/2019  Date of Discharge: 05/17/2019 Admitting Physician: [name] Reasons, MD  Primary Care Provider: Steve Rattler, DO Consultants: Cardiology  Indication for Hospitalization: Chest pain and shortness of breath  Discharge Diagnoses/Problem List:  Atypical chest pain Shortness of breath/tracheostomy History of CVA with residual expressive aphasia Atrial fibrillation and complete heart block s/p PPM 1998 HFpEF CKD stage IIIb Polymyalgia rheumatica Hypertension Hyperlipidemia Gout Anemia of chronic disease GAD  Disposition: home  Discharge Condition: stable, improved  Discharge Exam:   Physical Exam:  General: 73 y.o. female in NAD Neck: trach in place, clean Cardio: RRR no m/r/g Lungs: CTAB, no wheezing, no rhonchi, no crackles, no IWOB on RA Abdomen: Soft, non-tender to palpation, non-distended, positive bowel sounds Skin: warm and dry Extremities: No edema   Brief [name] Course:  [name] Buxbaum-Polkis a 73 y.o.femalepresenting with worsening SOB and chest pain. PMH is significant forrecent CVA on 5/24 with residual expressive aphasia, a-fib s/p PPM 1998, COPD, PMR, HLD, HTN, gout, GERD, CHF, CKDIII, Anemia with AVMs of stomach, OSA, traceostomy, remote history of alcohol abuse, and GAD.  Her [name] course as outlined below.  Chest pain Patient has a history of chronic chest pain for which she sees cardiology.  She had a right left heart cath in 2017 with normal coronary arteries.  During her admission, patient received EKG which was without acute changes and troponins trended which were lower than baseline, 0.14>0.15>0.13.  On 6/1, patient plan to be discharged, but then started to note occurrence of chest pain again.  Cardiology was consulted, notes that  pain is atypical and no further work-up needed while inpatient.  Patient was scheduled for follow-up with cardiology as outpatient on 05/19/2019.  At the time of discharge, patient was not having any chest pain, vital signs are stable.  Shortness of breath Patient has chronic tracheostomy secondary to OSA and laryngeal stenosis for which she follows with ENT at [facility].  She has complained of shortness of breath secondary to trach mucus during this hospitalization as well as 1 in the last week.  Chest x-ray negative, d-dimer not suggestive of PE.  Patient had improvement in her shortness of breath with suctioning of mucus and coughing.  She remained on room air during her hospitalization.  A follow-up appointment was made for her with ENT at [name] for 6/10 due to her frequent hospitalizations.  At the time of discharge, patient was breathing comfortably on room air without increased work of breathing and stable vital signs.  Issues for Follow Up:  1. Patient will follow-up with ENT for trach care 2. Patient should follow-up with cardiology regarding atypical chest pain 3. Patient had been on metoprolol prior to most recent hospitalization.  This was held at last discharge.  Her blood pressure throughout her hospitalization remained normotensive and on the lower side.  Continue to hold metoprolol on discharge.  Significant Procedures: None  Significant Labs and Imaging:  Recent Labs  Lab 05/15/19 1645 05/16/19 0259 05/17/19 0645  WBC 7.3 5.7 5.1  HGB 9.6* 9.1* 9.1*  HCT 31.1* 28.6* 29.1*  PLT 242 239 253   Recent Labs  Lab 05/11/19 0352 05/15/19 1645 05/16/19 0259 05/17/19 0645  NA 138 135 137 138  K 3.5 4.2 3.8 3.7  CL 98 95* 98 97*  CO2 27 24 26  29  GLUCOSE 99 147* 107* 104*  BUN 38* 52* 55* 61*  CREATININE 1.82* 1.62* 1.63* 1.86*  CALCIUM 10.2 10.8* 10.1 10.2    Dg Chest Portable 1 View  Result Date: 05/15/2019 CLINICAL DATA:  Acute onset dyspnea today. EXAM:  PORTABLE CHEST 1 VIEW COMPARISON:  CT chest and single view of the chest 05/08/2019. FINDINGS: Lungs clear. There is cardiomegaly without edema. No pneumothorax or pleural effusion. Pacing device and tracheostomy tube again seen. IMPRESSION: No acute disease. Electronically Signed   By: Inge Rise M.D.   On: 05/15/2019 17:18   Results/Tests Pending at Time of Discharge: None  Discharge Medications:  Allergies as of 05/17/2019      Reactions   Ace Inhibitors Swelling   angiodema   Other Anaphylaxis, Swelling   Reaction to unspecified anesthesia       Medication List    STOP taking these medications   guaiFENesin-dextromethorphan 100-10 MG/5ML syrup Commonly known as:  ROBITUSSIN DM     TAKE these medications   acetaminophen 650 MG CR tablet Commonly known as:  TYLENOL Take 650-1,300 mg by mouth every 8 (eight) hours as needed for pain.   albuterol 108 (90 Base) MCG/ACT inhaler Commonly known as:  VENTOLIN HFA Inhale 1-2 puffs into the lungs every 6 (six) hours as needed for wheezing or shortness of breath.   allopurinol 100 MG tablet Commonly known as:  ZYLOPRIM Take 2 tablets (200 mg total) by mouth daily.   ALPRAZolam 0.5 MG tablet Commonly known as:  XANAX TAKE 1 TABLET(0.5 MG) BY MOUTH DAILY AS NEEDED FOR ANXIETY What changed:  See the new instructions.   apixaban 5 MG Tabs tablet Commonly known as:  ELIQUIS Take 1 tablet (5 mg total) by mouth 2 (two) times daily.   bimatoprost 0.01 % Soln Commonly known as:  LUMIGAN Place 1 drop into both eyes at bedtime.   cetirizine 10 MG tablet Commonly known as:  ZYRTEC Take 10 mg by mouth daily.   ferrous sulfate 325 (65 FE) MG tablet TAKE 1 TABLET BY MOUTH TWICE DAILY WITH MEALS TO KEEP BLOOD COUNT UP What changed:    how much to take  how to take this  when to take this  additional instructions   furosemide 40 MG tablet Commonly known as:  LASIX Take 1 tablet (40 mg total) by mouth 2 (two) times daily.    ipratropium-albuterol 0.5-2.5 (3) MG/3ML Soln Commonly known as:  DUONEB USE 3 ML VIA NEBULIZER EVERY 6 HOURS What changed:    how to take this  when to take this  [name] to take this  additional instructions   magnesium hydroxide 400 MG/5ML suspension Commonly known as:  MILK OF MAGNESIA Take 60 mLs by mouth daily as needed for mild constipation.   nitroGLYCERIN 0.4 MG SL tablet Commonly known as:  NITROSTAT Place 1 tablet (0.4 mg total) under the tongue every 5 (five) minutes as needed for chest pain.   Olopatadine HCl 0.2 % Soln Place 1-2 drops into both eyes daily as needed (itching).   polyethylene glycol 17 g packet Commonly known as:  MIRALAX / GLYCOLAX Take 17 g by mouth daily as needed for mild constipation.   potassium chloride 10 MEQ tablet Commonly known as:  K-DUR TAKE 1 TABLET BY MOUTH DAILY   predniSONE 2.5 MG tablet Commonly known as:  DELTASONE Take 3 tablets (7.5 mg total) by mouth daily with breakfast.   rosuvastatin 20 MG tablet Commonly known as:  CRESTOR Take 1  tablet (20 mg total) by mouth daily. What changed:  when to take this   sodium chloride 0.65 % Soln nasal spray Commonly known as:  OCEAN Place 1 spray into both nostrils daily as needed for congestion.   triamcinolone cream 0.1 % Commonly known as:  KENALOG Apply 1 application topically 2 (two) times daily. What changed:    when to take this  [name] to take this   Vitamin D 50 MCG (2000 UT) tablet Take 2,000 Units by mouth daily.       Discharge Instructions: Please refer to Patient Instructions section of EMR for full details.  Patient was counseled important signs and symptoms that should prompt return to medical care, changes in medications, dietary instructions, activity restrictions, and follow up appointments.   Follow-Up Appointments: Follow-up Information    Skeet Latch, MD. Go on 05/19/2019.   Specialty:  Cardiology Why:  @8AM   Contact information: [address] [place] [address] [phone]        [facility]. Go on 05/25/2019.   Why:  @11AM        Matilde Haymaker, MD. Schedule an appointment as soon as possible for a visit on 05/20/2019.   Specialty:  Family Medicine Why:  @1 :45PM (please arrive at least 15 min prior to appointment time) Contact information: [place] [address] [phone]        [facility]-Home Follow up.   Specialty:  [place] Why:  They will continue to do your home health care at your home          Cleophas Dunker, DO 05/17/2019, 10:08 AM PGY-1, [place]

## 2019-05-16 NOTE — Progress Notes (Addendum)
Family Medicine Teaching Service Daily Progress Note Intern Pager: [phone]  Patient name: [name] Medical record number: [id number] Date of birth: 1946/12/13 Age: 73 y.o. Gender: female  Primary Care Provider: Steve Rattler, DO Consultants: None Code Status: Full  Pt Overview and Major Events to Date:  5/31 Admitted to FPTS  Assessment and Plan: [name] is a 73 y.o. female presenting with worsening SOB and chest pain. PMH is significant for recent CVA on 5/24 with residual expressive aphasia, a-fib s/p PPM 1998, COPD, PMR, HLD, HTN, gout, GERD, CHF, CKDIII, Anemia with AVMs of stomach, OSA, traceostomy, remote history of alcohol abuse, and GAD,   Unstable Angina: EKG on admission with paced rhythm and appears chronic ST depression in V1-V2, otherwise no new ST changes notable. Last R/L heart cath in 2017 with normal coronary arteries. Troponins 0.14>0.14>0.15.  EKG this AM unchanged.  Patient denies CP this AM.  - trend troponins  - continuous cardiac monitor, pulse ox - AM EKG - PRN Nitro for chest pain - AM CBC, BMP - Tylenol and zofran PRN - vitals per floor  - Continue home Crestor and Elqiuis  - Per chart review, hold ASA given currently on Eliquis with history of GI bleed - will ensure cardiology follow up as outpatient  SOB  Tracheostomy: Tracheostomy secondary to OSA and laryngeal stenosis, seen by ENT at [facility], most recently on 5/4 in which trach tube was replaced. Next appointment scheduled for 6/30. Endorses chronic SOB she attributes to trach mucus. Notes that SOB improved with suction and denies SOB this AM.  States that this SOB is very chronic and she was not expecting to be admitted to the hospital. - Respiratory Therapy for trach care - albuterol prn - will attempt to move up ENT appointment  History of CVA with residual Expressive Aphasia: Admitted for cardioembolic stroke of the lower left parietal lobe on 5/24. Started  on Eliquis 5mg  BID. Has residual expressive aphasia. Does have history of GI bleed. Hgb stbale at 9.6 (baseline 9-10). No new focal neurological deficits appreciated on exam. - Continue Eliquis 5mg  BID  Atrial Fibrillation and complete heart block s/p PPM 1998 Follows with cardiology. Last pacemaker check 09/2018 that was WNL. Home meds: Eliquis 5mg  BID.  - cardiac telemetry  HFpEF: chronic, stable Last echo (05/09/19) notable for EF >65%, severely increased LV wall thickness, and moderate mitral valve regurg. Home mesd: Lasix 40mg  BID and Potassium 26mEq QD. Appears euvolemic on exam. - continue home meds  CKD Stage IIIb Cr  1.63 which appears to be at baseline and improved since discharge on 5/27. GFR 35. - monitor Cr, daily BMP - Continue home Lasix 40mg  BID  Polymyalgia rheumatica: Chronic, stable Discharged on 5/27 on Prednisone 7.5mg  QD. Follows with Leafy Kindle, PA at [facility]. - continue home meds  HTN: Chronic, stable BP 97/69 this AM.  History of taking Metoprolol PRN, held at last discharge. - continue to monitor - will continue to hold Metoprolol  HLD Last lipid panel 5/25: Chol 136, HDL 60, LDL 56, Trig 99. Home meds: Rosuvastatin 20mg  QD - continue home meds  Gout: Chronic, stable Home meds: Allopurinol 200mg  QD. No acute gouty flare. - contineu home meds  Anemia of Chronic Disease: Stable Endorsed darker stools but is currently on iron supplement. Has a history of gastric AVMs per chart review and is currently on Eliquis for recent cardioembolic stroke. Received IV feraheme during last admission.  Hgb stable at 9.6 (baseline 9-10). Home  meds: Iron 325mg  QD. Last colonoscopy in 2018 notable for several polyps with recommendation for repeat in 3 years. - FOBT to rule out GI bleed - monitor CBC  GAD Home meds: Xanax 0.5 mg prn qd. Discussion at discharge of transitioning to new anxiolytic. Can consider at follow up outpatient visit. -  cont xanax qHS prn  FEN/GI: Heart Healthy PPx: Eliquis  Disposition: likely home today  Subjective:  Patient notes that she does not have CP or SOB this AM.  Notes improvement in SOB with suction and was able to rest overnight.  Objective: Temp:  [97.8 F (36.6 C)-98.6 F (37 C)] 98.2 F (36.8 C) (06/01 0542) Pulse Rate:  [68-78] 70 (06/01 0542) Resp:  [13-27] 20 (06/01 0542) BP: (97-133)/(69-95) 97/69 (06/01 0542) SpO2:  [64 %-100 %] 97 % (06/01 0542) FiO2 (%):  [28 %] 28 % (05/31 2049) Weight:  [70.9 kg-81 kg] 70.9 kg (06/01 0542)  Physical Exam:  General: 73 y.o. female in NAD Neck: trach in place, clean Cardio: RRR  Lungs: CTAB, no wheezing, no rhonchi, no crackles, no IWOB on RA Abdomen: Soft, non-tender to palpation, non-distended, positive bowel sounds Skin: warm and dry Extremities: No edema   Laboratory: Recent Labs  Lab 05/11/19 0352 05/15/19 1645 05/16/19 0259  WBC 4.0 7.3 5.7  HGB 10.0* 9.6* 9.1*  HCT 31.0* 31.1* 28.6*  PLT 213 242 239   Recent Labs  Lab 05/11/19 0352 05/15/19 1645 05/16/19 0259  NA 138 135 137  K 3.5 4.2 3.8  CL 98 95* 98  CO2 27 24 26   BUN 38* 52* 55*  CREATININE 1.82* 1.62* 1.63*  CALCIUM 10.2 10.8* 10.1  GLUCOSE 99 147* 107*     Imaging/Diagnostic Tests: Dg Chest Portable 1 View  Result Date: 05/15/2019 CLINICAL DATA:  Acute onset dyspnea today. EXAM: PORTABLE CHEST 1 VIEW COMPARISON:  CT chest and single view of the chest 05/08/2019. FINDINGS: Lungs clear. There is cardiomegaly without edema. No pneumothorax or pleural effusion. Pacing device and tracheostomy tube again seen. IMPRESSION: No acute disease. Electronically Signed   By: Inge Rise M.D.   On: 05/15/2019 17:18    , Bernita Raisin, DO 05/16/2019, 7:41 AM PGY-1, [place] Intern pager: [phone], text pages welcome

## 2019-05-16 NOTE — Discharge Instructions (Signed)
You were admitted to the hospital for chest pain and shortness of breath.  Your heart seems to be fine, but you should follow-up with cardiology.  You were made a sooner appointment this week.  You also made an earlier appointment with the ear nose and throat doctor to take care of your trach.  Please make sure that you follow-up with these appointments.  If you start having worsening chest pain, worsening shortness of breath, inability to speak due to shortness of breath, please be seen.

## 2019-05-16 NOTE — Progress Notes (Signed)
Spoke with family medicine MD to let them know that patient was suctioned by respiratory therapist and it was well tolerated by patient. Per RT secretions were thin and clear. Patient was instructed to cough and was well tolerated.  Patient will be kept until tomorrow for observation. Nurse tech to place cardiac monitor back on. Patient is currently lying in bed with eyes close. Call bell within reach.

## 2019-05-16 NOTE — Care Management Obs Status (Signed)
[place] NOTIFICATION   Patient Details  Name: Ilana Prezioso MRN: [id number] Date of Birth: [birth year]   Medicare Observation Status Notification Given:  Yes(Pt refused to sign letter)    Royston Bake, RN 05/16/2019, 4:27 PM

## 2019-05-16 NOTE — TOC Initial Note (Signed)
Transition of Care [facility]) - Initial/Assessment Note    Patient Details  Name: [name] MRN: [id number] Date of Birth: 1946/01/27  Transition of Care [facility]) CM/SW Contact:    Sherrilyn Rist Phone Number: [phone] 05/16/2019, 4:23 PM  Clinical Narrative:                 Patient lives at home with family; she is active with [facility] [place] as prior to admission for HHPT/OT/ST; Dan with [facility] is aware of admission.   Expected Discharge Plan: [place] Barriers to Discharge: No Barriers Identified   Patient Goals and CMS Choice Patient states their goals for this hospitalization and ongoing recovery are:: to stay at home CMS Medicare.gov Compare Post Acute Care list provided to:: Patient    Expected Discharge Plan and Services Expected Discharge Plan: [place] In-house Referral: NA Discharge Planning Services: CM Consult Post Acute Care Choice: Resumption of Svcs/PTA Provider Living arrangements for the past 2 months: Single Family Home Expected Discharge Date: 05/16/19               DME Arranged: N/A DME Agency: NA       HH Arranged: PT, OT, Speech Therapy [place] Agency: [place] ([place]) Date HH Agency Contacted: 05/16/19 Time [place]: 1622 Representative spoke with at [place]: Hydrographic surveyor  Prior Living Arrangements/Services Living arrangements for the past 2 months: [place] with:: Adult Children Patient language and need for interpreter reviewed:: Yes Do you feel safe going back to the place where you live?: Yes      Need for Family Participation in Patient Care: Yes (Comment) Care giver support system in place?: Yes (comment) Current home services: Home PT, Home RN, Homehealth aide Criminal Activity/Legal Involvement Pertinent to Current Situation/Hospitalization: Yes - Comment as needed  Activities of Daily Living Home Assistive Devices/Equipment:  Cane (specify quad or straight), Walker (specify type) ADL Screening (condition at time of admission) Patient's cognitive ability adequate to safely complete daily activities?: Yes Is the patient deaf or have difficulty hearing?: No Does the patient have difficulty seeing, even when wearing glasses/contacts?: No Does the patient have difficulty concentrating, remembering, or making decisions?: Yes Patient able to express need for assistance with ADLs?: Yes Does the patient have difficulty dressing or bathing?: No Independently performs ADLs?: Yes (appropriate for developmental age) Does the patient have difficulty walking or climbing stairs?: No Weakness of Legs: Both Weakness of Arms/Hands: None  Permission Sought/Granted Permission sought to share information with : Case Manager Permission granted to share information with : Yes, Verbal Permission Granted     Permission granted to share info w AGENCY: [place] agency        Emotional Assessment Appearance:: Developmentally appropriate Attitude/Demeanor/Rapport: Gracious Affect (typically observed): Accepting Orientation: : Oriented to Self, Oriented to  Time, Oriented to Place, Oriented to Situation Alcohol / Substance Use: Not Applicable Psych Involvement: No (comment)  Admission diagnosis:  SOB (shortness of breath) [R06.02] Atypical chest pain [R07.89] Patient Active Problem List   Diagnosis Date Noted  . Atypical chest pain   . Chest pain on exertion 05/15/2019  . Cerebral embolism with cerebral infarction 05/09/2019  . Expressive aphasia 05/08/2019  . Other abnormal glucose 11/26/2018  . Polypharmacy 06/25/2018  . Depression with anxiety   . Tracheostomy in place [facility])   . Tracheostomy care ([place])   . Calculus of gallbladder without cholecystitis without obstruction 12/24/2017  . Chronic cough  09/25/2017  . History of colonic polyps   . Moderate protein-calorie malnutrition ([place]) 06/02/2017  . Chronic idiopathic  constipation 03/19/2017  . 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 Permanent atrial fibrillation   . Laryngeal stenosis 09/18/2016  . Esophageal dysphagia   . Polymyalgia rheumatica ([place]) 04/10/2015  . Hypertrophic cardiomyopathy ([place]) 02/15/2015  . Eczema 09/18/2014  . Environmental allergies 09/18/2014  . Pre-diabetes 11/26/2013  . COPD, moderate ([place]) 11/01/2013  . Complete heart block ([place]) 04/03/2011  . Anemia 02/17/2011  . AVM (arteriovenous malformation) 02/13/2011  . PACEMAKER-[place] 11/28/2010  . CKD (chronic kidney disease) stage 3, GFR 30-59 ml/min (HCC) 09/24/2010  . Acute on chronic congestive heart failure ([place]) 08/30/2010  . ARTHRITIS 07/24/2010  . Generalized anxiety disorder 07/23/2010  . Hyperlipidemia 02/11/2007  . Essential hypertension 02/11/2007  . APNEA, SLEEP 02/11/2007   PCP:  Steve Rattler, DO Pharmacy:   [facility] [place], [address] [place] [address] Phone: [phone] Fax: [phone]     Social Determinants of Health (SDOH) Interventions    Readmission Risk Interventions No flowsheet data found.

## 2019-05-16 NOTE — Progress Notes (Signed)
OT Cancellation Note  Patient Details Name: [name] MRN: [id number] DOB: 1946/03/29   Cancelled Treatment:    Reason Eval/Treat Not Completed: Patient declined, no reason specified. Pt reports fatigue and not wanting to work with therapy currently, also pending EKG. OT will continue to follow for evaluation.  [name] 05/16/2019, 9:16 AM   Hulda Humphrey OTR/L Acute Rehabilitation Services Pager: [phone] Office: [phone]

## 2019-05-16 NOTE — Evaluation (Signed)
Occupational Therapy Evaluation and defer to [facility] Patient Details Name: [name] MRN: [id number] DOB: [birth year] Today's Date: 05/16/2019    History of Present Illness 73 y.o. female presenting with worsening SOB and chest pain. 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 Pt today is overall supervision for UB and LB dressing, ambulation to bathroom for toilet transfer/peri care, and sink level grooming. Pt expressed concerns about medicine management, and managing her appointment schedule. Pt will benefit from continued HHOT to focus on safety and independence in the home managing ADL and IADL. OT to sign off acutely and defer to [place]. Thank you for the opportunity to serve this patient.     Follow Up Recommendations  Home health OT    Equipment Recommendations  None recommended by OT    Recommendations for Other Services       Precautions / Restrictions Precautions Precautions: Fall Precaution Comments: Chronic trach with PMV safety-pinned to gown Restrictions Weight Bearing Restrictions: No      Mobility Bed Mobility Overal bed mobility: Modified Independent             General bed mobility comments: Pt received ambulating around room without staff present.   Transfers Overall transfer level: Modified independent               General transfer comment: supervision for safety, but no physical assist required    Balance Overall balance assessment: Mild deficits observed, not formally tested Sitting-balance support: Feet supported;No upper extremity supported Sitting balance-Leahy Scale: Good     Standing balance support: No upper extremity supported;During functional activity Standing balance-Leahy  Scale: Good                             ADL either performed or assessed with clinical judgement   ADL Overall ADL's : Needs assistance/impaired Eating/Feeding: Modified independent;Sitting   Grooming: Standing;Wash/dry hands;Supervision/safety Grooming Details (indicate cue type and reason): supervision for safety and balance  Upper Body Bathing: Supervision/ safety;Set up;Sitting   Lower Body Bathing: Supervison/ safety   Upper Body Dressing : Modified independent   Lower Body Dressing: Supervision/safety;Sit to/from stand   Toilet Transfer: Supervision/safety;Ambulation Toilet Transfer Details (indicate cue type and reason): reaches for furniture [place] and Hygiene: Supervision/safety;Sit to/from stand       Functional mobility during ADLs: Supervision/safety General ADL Comments: Pt expressed concerns about managing medicines, and MD appointments. "How can I keep up with all of this?"     Vision   Vision Assessment?: No apparent visual deficits     Perception     Praxis      Pertinent Vitals/Pain Pain Assessment: No/denies pain Faces Pain Scale: No hurt     Hand Dominance Right   Extremity/Trunk Assessment Upper Extremity Assessment Upper Extremity Assessment: Overall WFL for tasks assessed   Lower Extremity Assessment Lower Extremity Assessment: Defer to PT evaluation   Cervical / Trunk Assessment Cervical / Trunk Assessment: Normal   Communication Communication Communication: Expressive difficulties(aphasia from recent CVA)   Cognition Arousal/Alertness: Awake/alert Behavior During Therapy: WFL for tasks assessed/performed Overall Cognitive Status: No family/caregiver present to determine baseline cognitive functioning  General Comments: Oriented, but expressive aphasia is limiting full cognitive evaluation   General Comments  Pt seems anxious about IADL/medicine  management    Exercises     Shoulder Instructions      Home Living Family/patient expects to be discharged to:: Private residence Living Arrangements: Children(son and daughter (adult)) Available Help at Discharge: Family;Available 24 hours/day Type of Home: House Home Access: Level entry Entrance Stairs-Number of Steps: "a lot" Entrance Stairs-Rails: Right Home Layout: One level     Bathroom Shower/Tub: Teacher, early years/pre: Standard     Home Equipment: Environmental consultant - 2 wheels;Bedside commode   Additional Comments: pt reports there are stairs from the road but that her son drives her to the door  Lives With: Son    Prior Functioning/Environment Level of Independence: Needs assistance  Gait / Transfers Assistance Needed: independent with mobility  ADL's / Homemaking Assistance Needed: Reports independent with ADLs. Pt states she can typically ambulate around the grocery store and get everything she needs without AD.             OT Problem List: Decreased strength;Decreased activity tolerance;Impaired balance (sitting and/or standing);Decreased safety awareness;Decreased knowledge of use of DME or AE;Other (comment)(impaired communication)      OT Treatment/Interventions:      OT Goals(Current goals can be found in the care plan section) Acute Rehab OT Goals Patient Stated Goal: go home OT Goal Formulation: With patient Time For Goal Achievement: 05/30/19 Potential to Achieve Goals: Good  OT Frequency:     Barriers to D/C:            Co-evaluation              AM-PAC OT "6 Clicks" Daily Activity     Outcome Measure Help from another person eating meals?: None Help from another person taking care of personal grooming?: A Little Help from another person toileting, which includes using toliet, bedpan, or urinal?: None Help from another person bathing (including washing, rinsing, drying)?: A Little Help from another person to put on and taking off  regular upper body clothing?: None Help from another person to put on and taking off regular lower body clothing?: None 6 Click Score: 22   End of Session Equipment Utilized During Treatment: Gait belt Nurse Communication: Mobility status  Activity Tolerance: Patient tolerated treatment well Patient left: in bed;with call bell/phone within reach  OT Visit Diagnosis: Muscle weakness (generalized) (M62.81);Unsteadiness on feet (R26.81);Cognitive communication deficit (R41.841) Symptoms and signs involving cognitive functions: Cerebral infarction                Time: 0355-9741 OT Time Calculation (min): 16 min Charges:  OT General Charges $OT Visit: 1 Visit OT Evaluation $OT Eval Moderate Complexity: [place] OTR/L Acute Rehabilitation Services Pager: [phone] Office: [place] 05/16/2019, 3:56 PM

## 2019-05-16 NOTE — Progress Notes (Addendum)
Patient refused to have IV put in by IV team.  Charge nurse aware. If IV required put in a STAT IV consult.  Paged family medicine to make sure that patient did not required an IV overnight. MD stated that it was not required since pt will be discharge tomorrow.

## 2019-05-17 ENCOUNTER — Encounter (HOSPITAL_COMMUNITY): Payer: Medicare Other

## 2019-05-17 ENCOUNTER — Telehealth: Payer: Self-pay | Admitting: Physician Assistant

## 2019-05-17 DIAGNOSIS — R0789 Other chest pain: Secondary | ICD-10-CM | POA: Diagnosis not present

## 2019-05-17 DIAGNOSIS — R0602 Shortness of breath: Secondary | ICD-10-CM

## 2019-05-17 LAB — BASIC METABOLIC PANEL
Anion gap: 12 (ref 5–15)
BUN: 61 mg/dL — ABNORMAL HIGH (ref 8–23)
CO2: 29 mmol/L (ref 22–32)
Calcium: 10.2 mg/dL (ref 8.9–10.3)
Chloride: 97 mmol/L — ABNORMAL LOW (ref 98–111)
Creatinine, Ser: 1.86 mg/dL — ABNORMAL HIGH (ref 0.44–1.00)
GFR calc Af Amer: 30 mL/min — ABNORMAL LOW (ref >60)
GFR calc non Af Amer: 26 mL/min — ABNORMAL LOW (ref >60)
Glucose, Bld: 104 mg/dL — ABNORMAL HIGH (ref 70–99)
Potassium: 3.7 mmol/L (ref 3.5–5.1)
Sodium: 138 mmol/L (ref 135–145)

## 2019-05-17 LAB — CBC
HCT: 29.1 % — ABNORMAL LOW (ref 36.0–46.0)
Hemoglobin: 9.1 g/dL — ABNORMAL LOW (ref 12.0–15.0)
MCH: 29.3 pg (ref 26.0–34.0)
MCHC: 31.3 g/dL (ref 30.0–36.0)
MCV: 93.6 fL (ref 80.0–100.0)
Platelets: 253 10*3/uL (ref 150–400)
RBC: 3.11 MIL/uL — ABNORMAL LOW (ref 3.87–5.11)
RDW: 14.9 % (ref 11.5–15.5)
WBC: 5.1 10*3/uL (ref 4.0–10.5)
nRBC: 0 % (ref 0.0–0.2)

## 2019-05-17 MED ORDER — ALBUTEROL SULFATE (2.5 MG/3ML) 0.083% IN NEBU
2.5000 mg | INHALATION_SOLUTION | Freq: Four times a day (QID) | RESPIRATORY_TRACT | Status: DC | PRN
  Filled 2019-05-17: qty 3

## 2019-05-17 NOTE — Progress Notes (Signed)
Pt asleep at time of scheduled trach check. Pt in no obvious respiratory distress at this time. RT Will check back later.

## 2019-05-17 NOTE — Progress Notes (Signed)
Pt given oral and written discharge instructions. Assisted to lobby by staff for discharge.

## 2019-05-17 NOTE — Plan of Care (Signed)
?  Problem: Education: ?Goal: Ability to demonstrate management of disease process will improve ?Outcome: Progressing ?  ?Problem: Activity: ?Goal: Capacity to carry out activities will improve ?Outcome: Progressing ?  ?Problem: Cardiac: ?Goal: Ability to achieve and maintain adequate cardiopulmonary perfusion will improve ?Outcome: Progressing ?  ?

## 2019-05-17 NOTE — Telephone Encounter (Signed)
called to pre reg, spoke with daughter, patient is in the hospital, may return home tomorrow before tele visit on 05/19/19...call back to confirm appt. on 05/18/19 admin staff was informed.[name] KitchenMarland [name]

## 2019-05-17 NOTE — Consult Note (Signed)
   [facility] CM Inpatient Consult   05/17/2019  [name] February 15, 1946 [id number]    Patient was reviewed for readmission back to the hospital within 7 days and a readmission within 30 days, with 2 hospitalizations and 5 ED visits in the last 6 months. She has 28% high risk score for unplanned readmission, and to check for possible needs for [facility] care management services as benefit from her [employer]. Patient was previously active with [facility] community care management coordinator and [place] pharmacist in the past. Patient/son has been recently outreached by [facility] Coordinator who will follow-up patient post discharge (per Outreach note 6/1).   Patient has active [facility]- CM consent on file.  Review of patient's medical record reveals that: [name] a 73 y.o.femalepresenting with worsening SOB and chest pain. PMH is significant forrecent CVA on 5/24 with residual expressive aphasia, a-fib s/p PPM 1998, COPD, PMR, HLD, HTN, gout, GERD, CHF, CKDIII, Anemia with AVMs of stomach, OSA, tracheostomy, remote history of alcohol abuse, and GAD . Patient presented with shortness of breath and chest pain. She has a history of chronic chest pain for which she sees cardiology. She also has chronic tracheostomy secondary to OSA and laryngeal stenosis for which she follows with ENT at [place] Provider is Lucila Maine, DO with [facility], listed as providing transition of care.  Review of transition of care CM note shows that patient lives at home with family and disposition is Home with [place]. She is active with [facility] for [facility] PT/ OT/ ST and will resume services post discharge.   Will notify [facility] Telephonic CM coordinator of patient's current discharge disposition for follow-up. Patient will receive a [facility]-CM post hospital call & will be evaluated for assessments and disease process  education.  Inpatient transition of care CM will be made aware of it.   For questions and additional information, please call:  Khair Chasteen A. Dera Vanaken, BSN, RN-BC [facility] Liaison Cell: [phone]

## 2019-05-17 NOTE — Progress Notes (Signed)
Pt asleep at time of trach check. No obvious respiratory distress noted at this time. Earlier, Pt had requested breathing tx prior to bed, but no treatments ordered. Albuterol PRN was order per RT protocol, but both times RT went to check on pt, she was alseep. RN aware to notify RT if pt wakes up and wants a neb tx. RT will continue to monitor.

## 2019-05-18 ENCOUNTER — Other Ambulatory Visit: Payer: Self-pay | Admitting: Family Medicine

## 2019-05-18 ENCOUNTER — Telehealth: Payer: Self-pay | Admitting: Internal Medicine

## 2019-05-18 ENCOUNTER — Encounter (HOSPITAL_COMMUNITY): Payer: Medicare Other

## 2019-05-18 DIAGNOSIS — I422 Other hypertrophic cardiomyopathy: Secondary | ICD-10-CM

## 2019-05-18 NOTE — Telephone Encounter (Signed)
Left message for tracy to call back if needed. Appointment tomorrow.

## 2019-05-18 NOTE — Telephone Encounter (Signed)
Spoke with daughter/son will be present for tele-visit/call home phone/ consent from daughter/ my chart via text to daughter/pre reg completed

## 2019-05-18 NOTE — Progress Notes (Signed)
Virtual Visit via Telephone Note   This visit type was conducted due to national recommendations for restrictions regarding the COVID-19 Pandemic (e.g. social distancing) in an effort to limit this patient's exposure and mitigate transmission in our community.  Due to her co-morbid illnesses, this patient is at least at moderate risk for complications without adequate follow up.  This format is felt to be most appropriate for this patient at this time.  The patient did not have access to video technology/had technical difficulties with video requiring transitioning to audio format only (telephone).  All issues noted in this document were discussed and addressed.  No physical exam could be performed with this format.  Please refer to the patient's chart for her  consent to telehealth for [facility].   Date:  05/19/2019   ID:  [name], DOB 1946/08/05, MRN [id number]  Patient Location: Home Provider Location: Other:  [facility]  PCP:  Steve Rattler, DO  Cardiologist:  Skeet Latch, MD  Electrophysiologist:  Thompson Grayer, MD   Evaluation Performed:  Follow-Up Visit  Chief Complaint:  Follow up of recent stroke and new eliquis  History of Present Illness:    [name] is a 73 y.o. female with hypertension, hyperlipidemia, anemia of chronic disease, chronic diastolic heart failure, CKD stage II, permanent atrial fibrillation and complete heart block status post pacemaker placement in 1998, COPD, GERD, OSA and laryngeal stenosis status post trach, and polymyalgia rheumatica.  She has a history of a GI bleed in 2012 from gastric AVMs, and was therefore not on anticoagulation for atrial fibrillation.  She has normal coronary arteries by heart cath in 08/2016.  Echocardiogram 05/09/19 with a preserved EF 60 to 65%, but severely increased left ventricular wall thickness consistent with hypertrophic cardiomyopathy.  She was last seen in clinic on 09/23/2018 by  Dr. Rayann Heman.  She was doing well at that time.  Pacemaker function was appropriate.  Since that visit she has had multiple ER visits.   She was recently hospitalized from 05/08/2019 through 05/11/2019 for new onset stroke with expressive aphasia.  She initially presented for shortness of breath which she had attributed to a buildup of mucus in her tracheostomy.  CT head showed possible stroke in the lower left parietal lobe.  MRI was not obtained due to pacemaker placement.  She does have a history of GI bleed in 2012.  GI was consulted and she was deemed a candidate for anticoagulation.  She was started on Eliquis 5 mg twice daily.  Unfortunately she returned to the ER with chest pain and shortness of breath on 05/15/2019 and was admitted for further work-up.  Troponins were found mildly elevated and flat, inconsistent with ACS.  EKG was without acute changes when compared to prior tracings.  She was preparing to discharge but had another episode of chest pain.  Cardiology was consulted.  It was noted that troponin is chronically elevated.  It was felt no further cardiac work-up was needed.  Patient presents today for follow-up. Since discharge 05/17/19, she has noticed some blood in her stool yesterday. She states it was "bright red."  She states she noticed it one or two times but states "its not that bad." She is not dizzy or has not had syncope.   She denies chest pain and shortness of breath. No lower extremity edema or orthopnea. She has not other complaints at this time. Aside from the blood in her stool and speech difficult secondary to recent stroke,  it sounds like she is generally doing well and near her baseline  The patient does not have symptoms concerning for COVID-19 infection (fever, chills, cough, or new shortness of breath). She tested negative for COVID-19 on May 15, 2019.   Past Medical History:  Diagnosis Date   Anemia    Angina    Angioedema    2/2 ACE   Arteriovenous  malformation of stomach    Arthritis    Asthma    AVM (arteriovenous malformation) of colon    small intestine; stomach   Blood transfusion    CHF (congestive heart failure) (HCC)    Complete heart block (HCC)    s/p PPM 9833   Complication of anesthesia    Difficult airway; anaphylaxis and swelling with propofol   DDD (degenerative disc disease)    Depression    Diastolic heart failure    Fatty liver 07/26/10   GERD (gastroesophageal reflux disease)    GI bleed    Headache    History of alcohol abuse Stopped Fall 2012   History of tobacco use Quit Fall 2012   Hx of cardiovascular stress test    a. Lexiscan Myoview (10/15):  Small inferolateral and apical defect c/w scar and poss soft tissue attenuation, no ischemia, EF 42%   Hx of colonic polyp 08/13/10   adenomatous   Hx of colonoscopy    Hyperlipidemia    Hyperlipidemia    Hypertension    Hypertrophic cardiomyopathy ([place])    dx by Dr [name] 2009   Iron deficiency anemia    Myocardial infarction [facility])    Panic attack    Panic attacks    Permanent atrial fibrillation    Pneumonia    Renal failure    baseline creatinine 1.6   Right arm pain 01/08/2012   RLS (restless legs syndrome)    Dx 06/2007   Shortness of breath    sob on exertation   Sleep apnea    Past Surgical History:  Procedure Laterality Date   CARDIAC CATHETERIZATION     CARDIAC CATHETERIZATION N/A 08/19/2016   Procedure: Right/Left Heart Cath and Coronary Angiography;  Surgeon: Jolaine Artist, MD;  Location: [place] [facility];  Service: Cardiovascular;  Laterality: N/A;   COLONOSCOPY WITH PROPOFOL N/A 06/16/2017   Procedure: COLONOSCOPY WITH PROPOFOL;  Surgeon: Mauri Pole, MD;  Location: [facility];  Service: Endoscopy;  Laterality: N/A;   DIRECT LARYNGOSCOPY N/A 09/18/2016   Procedure: DIRECT LARYNGOSCOPY, BRONCHOSCOPY, REMOVAL OF INTUBATION GRANULOMA;  Surgeon: [name], MD;  Location: [facility] OR;   Service: ENT;  Laterality: N/A;   DIRECT LARYNGOSCOPY N/A 10/20/2016   Procedure: EXTUBATION AND FLEXIBLE LARYNGOSCOPE;  Surgeon: [name], MD;  Location: [facility] OR;  Service: ENT;  Laterality: N/A;   DIRECT LARYNGOSCOPY N/A 10/29/2016   Procedure: DIRECT LARYNGOSCOPY;  Surgeon: [name], MD;  Location: [facility] OR;  Service: ENT;  Laterality: N/A;   ESOPHAGOGASTRODUODENOSCOPY  12/23/2011   Procedure: ESOPHAGOGASTRODUODENOSCOPY (EGD);  Surgeon: [name], MD;  Location: [facility];  Service: Endoscopy;  Laterality: N/A;   ESOPHAGOGASTRODUODENOSCOPY (EGD) WITH PROPOFOL N/A 06/16/2017   Procedure: ESOPHAGOGASTRODUODENOSCOPY (EGD) WITH PROPOFOL;  Surgeon: Mauri Pole, MD;  Location: [facility];  Service: Endoscopy;  Laterality: N/A;   EXTUBATION (ENDOTRACHEAL) IN OR N/A 07/21/2016   Procedure: EXTUBATION (ENDOTRACHEAL) IN OR;  Surgeon: [name], MD;  Location: [place];  Service: ENT;  Laterality: N/A;   GIVENS CAPSULE STUDY  12/23/2011   Procedure: GIVENS CAPSULE STUDY;  Surgeon: [name], MD;  Location: [facility];  Service: Endoscopy;  Laterality: N/A;   MICROLARYNGOSCOPY WITH LASER N/A 10/16/2016   Procedure: MICRODIRECTLARYNGOSCOPY WITH LASER ABLATION AND KENLOG INJECTION;  Surgeon: [name], MD;  Location: [place];  Service: ENT;  Laterality: N/A;   [place], most recent gen change by JA 4/12   TRACHEOSTOMY TUBE PLACEMENT N/A 10/29/2016   Procedure: TRACHEOSTOMY;  Surgeon: [name], MD;  Location: [place];  Service: ENT;  Laterality: N/A;   TUBAL LIGATION  04/01/2000     Current Meds  Medication Sig   acetaminophen (TYLENOL) 650 MG CR tablet Take 650-1,300 mg by mouth every 8 (eight) hours as needed for pain.   albuterol (PROVENTIL HFA;VENTOLIN HFA) 108 (90 Base) MCG/ACT inhaler Inhale 1-2 puffs into the lungs every 6 (six) hours as needed for wheezing or shortness of breath.   allopurinol (ZYLOPRIM) 100 MG tablet TAKE 2  TABLETS(200 MG) BY MOUTH DAILY   ALPRAZolam (XANAX) 0.5 MG tablet TAKE 1 TABLET(0.5 MG) BY MOUTH DAILY AS NEEDED FOR ANXIETY (Patient taking differently: Take 0.5 mg by mouth daily. )   apixaban (ELIQUIS) 5 MG TABS tablet Take 1 tablet (5 mg total) by mouth 2 (two) times daily.   bimatoprost (LUMIGAN) 0.01 % SOLN Place 1 drop into both eyes at bedtime.   cetirizine (ZYRTEC) 10 MG tablet Take 10 mg by mouth daily.   Cholecalciferol (VITAMIN D) 50 MCG (2000 UT) tablet Take 2,000 Units by mouth daily.   ferrous sulfate 325 (65 FE) MG tablet TAKE 1 TABLET BY MOUTH TWICE DAILY WITH MEALS TO KEEP BLOOD COUNT UP (Patient taking differently: Take 325 mg by mouth 2 (two) times daily with a meal. )   furosemide (LASIX) 40 MG tablet TAKE 1 TABLET BY MOUTH TWICE DAILY   ipratropium-albuterol (DUONEB) 0.5-2.5 (3) MG/3ML SOLN USE 3 ML VIA NEBULIZER EVERY 6 HOURS (Patient taking differently: Take 3 mLs by nebulization every 6 (six) hours as needed (shortness of breath). )   magnesium hydroxide (MILK OF MAGNESIA) 400 MG/5ML suspension Take 60 mLs by mouth daily as needed for mild constipation.    nitroGLYCERIN (NITROSTAT) 0.4 MG SL tablet Place 1 tablet (0.4 mg total) under the tongue every 5 (five) minutes as needed for chest pain.   Olopatadine HCl 0.2 % SOLN Place 1-2 drops into both eyes daily as needed (itching).    polyethylene glycol (MIRALAX / GLYCOLAX) packet Take 17 g by mouth daily as needed for mild constipation.   potassium chloride (K-DUR) 10 MEQ tablet Take 1 tablet (10 mEq total) by mouth daily.   rosuvastatin (CRESTOR) 20 MG tablet Take 1 tablet (20 mg total) by mouth daily. (Patient taking differently: Take 20 mg by mouth at bedtime. )   sodium chloride (OCEAN) 0.65 % SOLN nasal spray Place 1 spray into both nostrils daily as needed for congestion.    triamcinolone cream (KENALOG) 0.1 % Apply 1 application topically 2 (two) times daily. (Patient taking differently: Apply 1  application topically 2 (two) times daily as needed (rash). )     Allergies:   Ace inhibitors and Other   Social History   Tobacco Use   Smoking status: Former Smoker    Packs/day: 0.10    Years: 50.00    Pack years: 5.00    Types: Cigarettes    Last attempt to quit: 08/16/2011    Years since quitting: 7.7   Smokeless tobacco: Never Used  Tobacco comment: quit 2012  Substance Use Topics   Alcohol use: No    Comment: quit 08/16/2011   Drug use: No     Family Hx: The patient's family history includes Alcohol abuse in her brother and father; Aneurysm in her mother; CVA in her mother; Cancer in her brother; Colon cancer in her brother; Diabetes in her brother and sister; Heart attack in her brother, brother, and father; Heart disease in her daughter, mother, and sister; Hypertension in her brother, father, mother, and sister; Stroke in her father and mother.  ROS:   Please see the history of present illness.     All other systems reviewed and are negative.   Prior CV studies:   The following studies were reviewed today:  Echo 05/09/19:  1. The left ventricle has hyperdynamic systolic function, with an ejection fraction of >65%. The cavity size was normal. There is severely increased left ventricular wall thickness. Findings are consistent with hypertrophic cardiomyopathy. Left  ventricular diastolic Doppler parameters are consistent with pseudonormalization.  2. The right ventricle has normal systolic function. The cavity was normal. There is no increase in right ventricular wall thickness.  3. Left atrial size was moderately dilated.  4. Right atrial size was moderately dilated.  5. Mitral valve regurgitation is moderate by color flow Doppler.  6. The aortic valve is tricuspid. Mild thickening of the aortic valve. Mild calcification of the aortic valve.   Labs/Other Tests and Data Reviewed:    EKG:  An ECG dated 05/16/19 was personally reviewed today and demonstrated:   V-paced rhythm, heart rate 70 - appears similar to prior tracings  Recent Labs: 05/08/2019: ALT 10 05/09/2019: Magnesium 1.8 05/15/2019: B Natriuretic Peptide 158.6 05/17/2019: BUN 61; Creatinine, Ser 1.86; Hemoglobin 9.1; Platelets 253; Potassium 3.7; Sodium 138   Recent Lipid Panel Lab Results  Component Value Date/Time   CHOL 136 05/09/2019 04:02 AM   CHOL 168 07/16/2017 03:55 PM   TRIG 99 05/09/2019 04:02 AM   HDL 60 05/09/2019 04:02 AM   HDL 48 07/16/2017 03:55 PM   CHOLHDL 2.3 05/09/2019 04:02 AM   LDLCALC 56 05/09/2019 04:02 AM   LDLCALC 99 07/16/2017 03:55 PM   LDLDIRECT 161.8 02/21/2013 02:16 PM    Wt Readings from Last 3 Encounters:  05/19/19 158 lb (71.7 kg)  05/17/19 158 lb 1.1 oz (71.7 kg)  05/11/19 178 lb 9.2 oz (81 kg)     Objective:    Vital Signs:  BP 90/60    Pulse 70    Ht 5\' 3"  (1.6 m)    Wt 158 lb (71.7 kg)    BMI 27.99 kg/m    VITAL SIGNS:  reviewed RESPIRATORY:  normal respiratory effort, symmetric expansion MUSCULOSKELETAL:  no lower extremity edema NEURO:  alert and oriented x 3, no obvious focal deficit PSYCH:  normal affect  ASSESSMENT & PLAN:    1. Permanent A. fib 2. Complete heart block 3. Pacemaker present 4. Hx of GI bleed in 2012 5. Bright red blood in stool last night x 2 - new eliquis 5 mg BID started in May 2020 - she noticed blood in her stool last night x 2 - repeat CBC today and urgent GI evaluation for bleed - This patients CHA2DS2-VASc Score and unadjusted Ischemic Stroke Rate (% per year) is equal to 11.2 % stroke rate/year from a score of 7 CHF, HTN, female, age2, stroke2)  I called and spoke with Dr. Silverio Decamp, who saw the pt in 2018, and relayed  her problems with bleeding. She agreed with a repeat CBC today. Appears her baseline Hb is 9. If she has a greater than 1 gram drop or if the bleeding gets worse, she should go to the ER. If Hb is stable, Dr. Silverio Decamp is OK with continuing eliquis and will make her an appt to be seen in  the office. I relayed the message and the patient's son expressed understanding. Pt also has a PCP appt tomorrow that I instructed her to keep.   6. Chronic diastolic heart failure 7. Hypertrophic cardiomyopathy - on 40 mg lasix BID and 10 mEq potassium - she sounds euvolemic based on pt and son/daughter descriptions - no change in diuretic regimen - repeat BMP to check renal function and potassium   8. Tracheostomy present - patient expresses desire for trach to be removed   9. History of recent stroke - speech is still altered from her stroke, but her mobility hasn't changed, per her son and daughter   67. Hyperlipidemia - continue ASA, statin - 05/09/2019: Cholesterol 136; HDL 60; LDL Cholesterol 56; Triglycerides 99; VLDL 20   Labs: - CBC - BMP  Appts: - Dr. Silverio Decamp with GI - follow up with [facility] in 3-4 weeks  Instructions: - if more bleeding or Hb is 8 or less, she will go to the ER for urgent evaluation   COVID-19 Education: The signs and symptoms of COVID-19 were discussed with the patient and how to seek care for testing (follow up with PCP or arrange E-visit). The importance of social distancing was discussed today.  Time:   Today, I have spent 51 minutes with the patient with telehealth technology discussing the above problems.     Medication Adjustments/Labs and Tests Ordered: Current medicines are reviewed at length with the patient today.  Concerns regarding medicines are outlined above.   Tests Ordered: Orders Placed This Encounter  Procedures   Ambulatory referral to Gastroenterology    Medication Changes: No orders of the defined types were placed in this encounter.   Disposition:  Follow up in 3 week(s)  Signed, Ledora Bottcher, PA  05/19/2019 9:45 AM    [place]

## 2019-05-18 NOTE — Telephone Encounter (Signed)
[name] from [facility] is a Palliative Care Nurse. She was calling to clarify medications that the patient should and should not be on. The patient had a couple recent hospitalizations, and there is some discrepancy in medication orders

## 2019-05-18 NOTE — Telephone Encounter (Signed)
VO given to [place].

## 2019-05-18 NOTE — Telephone Encounter (Signed)
Pt has app tomorrow 05/19/2019 @ [facility] office, pt has been seen for EP in past 09/23/2018. I Will forward msg.

## 2019-05-19 ENCOUNTER — Other Ambulatory Visit: Payer: Self-pay

## 2019-05-19 ENCOUNTER — Other Ambulatory Visit: Payer: Medicare Other

## 2019-05-19 ENCOUNTER — Telehealth: Payer: Self-pay | Admitting: *Deleted

## 2019-05-19 ENCOUNTER — Ambulatory Visit: Payer: Medicare Other | Admitting: Physician Assistant

## 2019-05-19 ENCOUNTER — Encounter: Payer: Self-pay | Admitting: Physician Assistant

## 2019-05-19 ENCOUNTER — Other Ambulatory Visit: Payer: Medicare Other | Admitting: *Deleted

## 2019-05-19 ENCOUNTER — Telehealth (INDEPENDENT_AMBULATORY_CARE_PROVIDER_SITE_OTHER): Payer: Medicare Other | Admitting: Physician Assistant

## 2019-05-19 ENCOUNTER — Other Ambulatory Visit: Payer: Self-pay | Admitting: Physician Assistant

## 2019-05-19 VITALS — BP 90/60 | HR 70 | Ht 63.0 in | Wt 158.0 lb

## 2019-05-19 DIAGNOSIS — I442 Atrioventricular block, complete: Secondary | ICD-10-CM

## 2019-05-19 DIAGNOSIS — Z86010 Personal history of colonic polyps: Secondary | ICD-10-CM

## 2019-05-19 DIAGNOSIS — I4821 Permanent atrial fibrillation: Secondary | ICD-10-CM | POA: Diagnosis not present

## 2019-05-19 DIAGNOSIS — K922 Gastrointestinal hemorrhage, unspecified: Secondary | ICD-10-CM | POA: Diagnosis not present

## 2019-05-19 DIAGNOSIS — I10 Essential (primary) hypertension: Secondary | ICD-10-CM

## 2019-05-19 DIAGNOSIS — Z950 Presence of cardiac pacemaker: Secondary | ICD-10-CM

## 2019-05-19 DIAGNOSIS — I421 Obstructive hypertrophic cardiomyopathy: Secondary | ICD-10-CM

## 2019-05-19 DIAGNOSIS — I5032 Chronic diastolic (congestive) heart failure: Secondary | ICD-10-CM

## 2019-05-19 DIAGNOSIS — E7800 Pure hypercholesterolemia, unspecified: Secondary | ICD-10-CM

## 2019-05-19 DIAGNOSIS — N183 Chronic kidney disease, stage 3 unspecified: Secondary | ICD-10-CM

## 2019-05-19 DIAGNOSIS — Z8719 Personal history of other diseases of the digestive system: Secondary | ICD-10-CM

## 2019-05-19 LAB — BASIC METABOLIC PANEL
BUN/Creatinine Ratio: 27 (ref 12–28)
BUN: 54 mg/dL — ABNORMAL HIGH (ref 8–27)
CO2: 27 mmol/L (ref 20–29)
Calcium: 10.5 mg/dL — ABNORMAL HIGH (ref 8.7–10.3)
Chloride: 96 mmol/L (ref 96–106)
Creatinine, Ser: 2.02 mg/dL — ABNORMAL HIGH (ref 0.57–1.00)
GFR calc Af Amer: 27 mL/min/{1.73_m2} — ABNORMAL LOW (ref >59)
GFR calc non Af Amer: 24 mL/min/{1.73_m2} — ABNORMAL LOW (ref >59)
Glucose: 113 mg/dL — ABNORMAL HIGH (ref 65–99)
Potassium: 3.7 mmol/L (ref 3.5–5.2)
Sodium: 138 mmol/L (ref 134–144)

## 2019-05-19 LAB — CBC
Hematocrit: 27.1 % — ABNORMAL LOW (ref 34.0–46.6)
Hemoglobin: 8.7 g/dL — ABNORMAL LOW (ref 11.1–15.9)
MCH: 30.7 pg (ref 26.6–33.0)
MCHC: 32.1 g/dL (ref 31.5–35.7)
MCV: 96 fL (ref 79–97)
Platelets: 266 10*3/uL (ref 150–450)
RBC: 2.83 x10E6/uL — ABNORMAL LOW (ref 3.77–5.28)
RDW: 14.6 % (ref 11.7–15.4)
WBC: 8.3 10*3/uL (ref 3.4–10.8)

## 2019-05-19 NOTE — Patient Instructions (Addendum)
Medication Instructions:  Your physician recommends that you continue on your current medications as directed. Please refer to the Current Medication list given to you today.  If you need a refill on your cardiac medications before your next appointment, please call your pharmacy.   Lab work: Your physician recommends that you return for lab work TODAY AT 11:30 am AT [place] [facility], [address].  If you have labs (blood work) drawn today and your tests are completely normal, you will receive your results only by: [name] MyChart Message (if you have MyChart) OR . A paper copy in the mail If you have any lab test that is abnormal or we need to change your treatment, we will call you to review the results.  Follow-Up: At [facility], you and your health needs are our priority.  As part of our continuing mission to provide you with exceptional heart care, we have created designated Provider Care Teams.  These Care Teams include your primary Cardiologist (physician) and Advanced Practice Providers (APPs -  Physician Assistants and Nurse Practitioners) who all work together to provide you with the care you need, when you need it. . You have been scheduled for a follow-up telephone appointment with Fabian Sharp, [place] in Monday, 06/06/19 at 8:45 AM.  . You have been scheduled for a follow-up IN OFFICE visit with Dr. Silverio Decamp on Wednesday, 06/08/19 at 8:30 AM. Please make sure to wear a mask when you go in for this appointment. She is located at [facility] ([place] in [place]).  Any Other Special Instructions Will Be Listed Below (If Applicable). If your experience more or increased rectal bleeding, please go to the emergency room for urgent evaluation.

## 2019-05-19 NOTE — Telephone Encounter (Signed)
Patient had appt today

## 2019-05-19 NOTE — Patient Outreach (Signed)
[facility]) Care Management  05/19/2019  [name] 22-Feb-1946 [id number]    Referral date: 05/17/19 Referral source: hospital liaison Insurance: [employer] Patients listed primary MD office completes transition of care  Attempt #1  Telephone call to patient's son, [name] for follow up.  Unable to reach. HIPAA compliant voice message left with call back phone number.    PLAN:  RNCM will attempt 2nd telephone call to patient within 4 business days.   Quinn Plowman RN,BSN,CCM [facility] Telephonic  [phone]

## 2019-05-19 NOTE — Telephone Encounter (Signed)
    COVID-19 Pre-Screening Questions:  . In the past 7 to 10 days have you had a cough,  shortness of breath, headache, congestion, fever (100 or greater) body aches, chills, sore throat, or sudden loss of taste or sense of smell? . Have you been around anyone with known Covid 19. . Have you been around anyone who is awaiting Covid 19 test results in the past 7 to 10 days? . Have you been around anyone who has been exposed to Covid 19, or has mentioned symptoms of Covid 19 within the past 7 to 10 days?  If you have any concerns/questions about symptoms patients report during screening (either on the phone or at [facility]). Contact the provider seeing the patient or DOD for further guidance.  If neither are available contact a member of the leadership team.          Contacted patient via phone call. No to covid 19 qiestions. Has a mask.KB

## 2019-05-20 ENCOUNTER — Telehealth: Payer: Self-pay | Admitting: Physician Assistant

## 2019-05-20 ENCOUNTER — Other Ambulatory Visit: Payer: Self-pay

## 2019-05-20 ENCOUNTER — Other Ambulatory Visit: Payer: Self-pay | Admitting: Family Medicine

## 2019-05-20 ENCOUNTER — Ambulatory Visit (INDEPENDENT_AMBULATORY_CARE_PROVIDER_SITE_OTHER): Payer: Medicare Other | Admitting: Family Medicine

## 2019-05-20 ENCOUNTER — Encounter: Payer: Self-pay | Admitting: Family Medicine

## 2019-05-20 ENCOUNTER — Other Ambulatory Visit: Payer: Self-pay | Admitting: Physician Assistant

## 2019-05-20 DIAGNOSIS — Z930 Tracheostomy status: Secondary | ICD-10-CM | POA: Diagnosis not present

## 2019-05-20 DIAGNOSIS — E785 Hyperlipidemia, unspecified: Secondary | ICD-10-CM | POA: Diagnosis not present

## 2019-05-20 DIAGNOSIS — R7989 Other specified abnormal findings of blood chemistry: Secondary | ICD-10-CM

## 2019-05-20 DIAGNOSIS — N183 Chronic kidney disease, stage 3 unspecified: Secondary | ICD-10-CM

## 2019-05-20 DIAGNOSIS — I10 Essential (primary) hypertension: Secondary | ICD-10-CM | POA: Diagnosis not present

## 2019-05-20 DIAGNOSIS — M1990 Unspecified osteoarthritis, unspecified site: Secondary | ICD-10-CM | POA: Diagnosis not present

## 2019-05-20 DIAGNOSIS — R0789 Other chest pain: Secondary | ICD-10-CM

## 2019-05-20 DIAGNOSIS — I130 Hypertensive heart and chronic kidney disease with heart failure and stage 1 through stage 4 chronic kidney disease, or unspecified chronic kidney disease: Secondary | ICD-10-CM | POA: Diagnosis not present

## 2019-05-20 DIAGNOSIS — J386 Stenosis of larynx: Secondary | ICD-10-CM | POA: Diagnosis not present

## 2019-05-20 DIAGNOSIS — I5032 Chronic diastolic (congestive) heart failure: Secondary | ICD-10-CM | POA: Diagnosis not present

## 2019-05-20 DIAGNOSIS — Q2733 Arteriovenous malformation of digestive system vessel: Secondary | ICD-10-CM | POA: Diagnosis not present

## 2019-05-20 DIAGNOSIS — R7303 Prediabetes: Secondary | ICD-10-CM | POA: Diagnosis not present

## 2019-05-20 DIAGNOSIS — I700 Atherosclerosis of aorta: Secondary | ICD-10-CM | POA: Diagnosis not present

## 2019-05-20 DIAGNOSIS — I209 Angina pectoris, unspecified: Secondary | ICD-10-CM | POA: Diagnosis not present

## 2019-05-20 DIAGNOSIS — I252 Old myocardial infarction: Secondary | ICD-10-CM | POA: Diagnosis not present

## 2019-05-20 DIAGNOSIS — D631 Anemia in chronic kidney disease: Secondary | ICD-10-CM | POA: Diagnosis not present

## 2019-05-20 DIAGNOSIS — M353 Polymyalgia rheumatica: Secondary | ICD-10-CM | POA: Diagnosis not present

## 2019-05-20 DIAGNOSIS — J449 Chronic obstructive pulmonary disease, unspecified: Secondary | ICD-10-CM | POA: Diagnosis not present

## 2019-05-20 DIAGNOSIS — I442 Atrioventricular block, complete: Secondary | ICD-10-CM | POA: Diagnosis not present

## 2019-05-20 DIAGNOSIS — I422 Other hypertrophic cardiomyopathy: Secondary | ICD-10-CM | POA: Diagnosis not present

## 2019-05-20 DIAGNOSIS — R1310 Dysphagia, unspecified: Secondary | ICD-10-CM | POA: Diagnosis not present

## 2019-05-20 DIAGNOSIS — I69320 Aphasia following cerebral infarction: Secondary | ICD-10-CM | POA: Diagnosis not present

## 2019-05-20 DIAGNOSIS — M1A9XX0 Chronic gout, unspecified, without tophus (tophi): Secondary | ICD-10-CM | POA: Diagnosis not present

## 2019-05-20 DIAGNOSIS — D508 Other iron deficiency anemias: Secondary | ICD-10-CM | POA: Diagnosis not present

## 2019-05-20 DIAGNOSIS — K219 Gastro-esophageal reflux disease without esophagitis: Secondary | ICD-10-CM | POA: Diagnosis not present

## 2019-05-20 DIAGNOSIS — G4733 Obstructive sleep apnea (adult) (pediatric): Secondary | ICD-10-CM | POA: Diagnosis not present

## 2019-05-20 DIAGNOSIS — I4821 Permanent atrial fibrillation: Secondary | ICD-10-CM | POA: Diagnosis not present

## 2019-05-20 NOTE — Progress Notes (Signed)
Subjective:  [name] is a 73 y.o. female who presents to the [facility] today with a chief complaint of hospital follow-up.   HPI: Chest pain She continues to report ongoing chest pain since her hospital admission.  This is not new.  Her chest pain is described as a sharp pain starting in the center of her chest and moving upwards toward her throat.  She notices this pain with activity and at rest.  There is no radiation to her shoulders back or jaw.  She tries to relax to alleviate the pain.  She is previously been seen by cardiology during her recent hospitalization at which time she was told to follow-up on a outpatient basis.  Mucus/phlegm production Has experienced significant mucus/phlegm production from her chest for the past month.  Seems to be 1 of the main problems led to her recent hospitalization.  She does not think that this problem has significantly improved or worsened since her discharge from the hospital.  She reports that she will sometimes cough up sputum with her tracheostomy which will leak down onto her chest.  She also reports that she sometimes awakens in the night due to discomfort which is related to her tracheostomy becoming plugged.  Again, it sounds like this issue has been stable since hospital discharge and has not worsened.  Blood pressure Her metoprolol was discontinued during her hospitalization.  She does not report any dizziness or lightheadedness.  Chief Complaint noted Review of Symptoms - see HPI PMH - Smoking status noted.    Objective:  Physical Exam: BP 118/60   Pulse 70   SpO2 98%    Gen: NAD, resting comfortably in the chair in the exam room.  She is able to demonstrate how to remove her tracheostomy tube and then replace it. CV: RRR with no murmurs appreciated Pulm: NWOB, CTAB with no crackles, wheezes, or rhonchi.  Breathing comfortably on room air Psych: Normal affect and thought content  Results for orders placed or performed  in visit on 05/19/19 (from the past 72 hour(s))  CBC     Status: Abnormal   Collection Time: 05/19/19 11:33 AM  Result Value Ref Range   WBC 8.3 3.4 - 10.8 x10E3/uL   RBC 2.83 (L) 3.77 - 5.28 x10E6/uL   Hemoglobin 8.7 (L) 11.1 - 15.9 g/dL   Hematocrit 27.1 (L) 34.0 - 46.6 %   MCV 96 79 - 97 fL   MCH 30.7 26.6 - 33.0 pg   MCHC 32.1 31.5 - 35.7 g/dL   RDW 14.6 11.7 - 15.4 %   Platelets 266 150 - 450 C16S0/YT  Basic metabolic panel     Status: Abnormal   Collection Time: 05/19/19 11:33 AM  Result Value Ref Range   Glucose 113 (H) 65 - 99 mg/dL   BUN 54 (H) 8 - 27 mg/dL   Creatinine, Ser 2.02 (H) 0.57 - 1.00 mg/dL   GFR calc non Af Amer 24 (L) >59 mL/min/1.73   GFR calc Af Amer 27 (L) >59 mL/min/1.73   BUN/Creatinine Ratio 27 12 - 28   Sodium 138 134 - 144 mmol/L   Potassium 3.7 3.5 - 5.2 mmol/L   Chloride 96 96 - 106 mmol/L   CO2 27 20 - 29 mmol/L   Calcium 10.5 (H) 8.7 - 10.3 mg/dL   *Note: Due to a large number of results and/or encounters for the requested time period, some results have not been displayed. A complete set of results can be found  in Results Review.     Assessment/Plan:  Essential hypertension Blood pressure stable today's visit.  We will continue off metoprolol.  Tracheostomy in place [facility]) She was encouraged to follow-up with ENT regarding the best way to prevent mucus/phlegm buildup in her tracheostomy for general trach management.  Next appointment with ENT is 6/12.  She was also told that there may be an element of her chest pain which radiates upward toward her throat that may be esophageal or related to discomfort from her tracheostomy.  Atypical chest pain Previous inpatient work-up was unremarkable.  No new symptoms since being seen in the hospital.  She is encouraged to continue following with cardiology.  Her next appointment with cardiology is on 6/22.

## 2019-05-20 NOTE — Telephone Encounter (Signed)
Repeat orders placed and released. Will have lab slips mailed to pt.

## 2019-05-20 NOTE — Progress Notes (Signed)
  Recent lab work demonstrates worsening creatinine. Will refer to nephro.

## 2019-05-20 NOTE — Patient Outreach (Signed)
[facility]) Care Management  05/20/2019  [name] 1946/03/07 [id number]  Referral date: 05/17/19 Referral source: hospital liaison Insurance: [employer] Patients listed primary MD office completes transition of care  Telephone call to patient regarding transition of care referral. HIPAA verified with patients son and designated party release.  Son states patient is doing well.  Son states doctor advised patient and/ or family to monitor patient for bleeding. Son states patient is on a blood thinner. Son reports patient has a nurse that comes to the home 1-2 times a week and fills patients medication box.  Son states patient was having speech therapy prior to being readmitted to the hospital . [facility] confirmed son has contact phone number for speech therapy.  Son advised to call home health company and inform them patient was discharged from the hospital and request services to resume. Son verbalized understanding. Son reports patient had a follow up visit with her primary MD today. Son states patients daughter accompanies her to her appointments.  He reports patient is taking her medications as prescribed. RNCM discussed and offered ongoing follow up.  Son declines ongoing follow up but agreeable to follow up call in 1 week.  RNCM reviewed signs/ symptoms of bleeding. Advised son to contact patients doctor for these symptoms.  RNCM advised patient to notify MD of any changes in condition prior to scheduled appointment. Contact number for  24 hour nurse advise line [phone] given to son in previous discussion. [name]  RNCM verified patient aware of 911 services for urgent/ emergent needs.  PLAN: RNCM will follow up within 1 week.   Quinn Plowman RN,BSN,CCM [facility] Telephonic  [phone]    [phone]

## 2019-05-20 NOTE — Telephone Encounter (Signed)
Result Notes for Basic metabolic panel and CBC   Notes recorded by Therisa Doyne on 05/20/2019 at 1:18 PM EDT Left detail message with results, ok per DPR, and to call back.  ------  Notes recorded by Ledora Bottcher, PA on 05/20/2019 at 11:49 AM EDT Hemoglobin is within range per Dr. Silverio Decamp. Follow up with GI as scheduled, unless bleeding worsens, then go to the ER. I am more concerned about her renal function. Please let your PCP know the creatinine level yesterday. I will reach out to pharmacy concerning eliquis - keep taking it for now. Would like to repeat your labs in 1 week (BMP, CBC). Please ask her if she has a nephrologist. If so, she needs to be seen ASAP. If not, needs ambulatory referral to nephrology.

## 2019-05-20 NOTE — Patient Instructions (Addendum)
It was great to meet you today [name].  It sounds like your mucus/phlegm production has not really changed since your hospitalization and that it seems to be clogging your trach tube at times.  I think that It will be very helpful for you to be seen by your Ear Nose and Throat doctor.  I think that it will also be helpful to be seen by your cardiologist to monitor your chest pain and heart medication.  You do not need to be on Metoprolol for blood pressure.  Make sure that you have gotten rid of this medication.  You have an appointment with ENT on 6/10 with Dr. Blenda Nicely  Other upcoming appointments can be found below.

## 2019-05-20 NOTE — Assessment & Plan Note (Signed)
Previous inpatient work-up was unremarkable.  No new symptoms since being seen in the hospital.  She is encouraged to continue following with cardiology.  Her next appointment with cardiology is on 6/22.

## 2019-05-20 NOTE — Assessment & Plan Note (Addendum)
She was encouraged to follow-up with ENT regarding the best way to prevent mucus/phlegm buildup in her tracheostomy for general trach management.  Next appointment with ENT is 6/12.  She was also told that there may be an element of her chest pain which radiates upward toward her throat that may be esophageal or related to discomfort from her tracheostomy.

## 2019-05-20 NOTE — Assessment & Plan Note (Signed)
Blood pressure stable today's visit.  We will continue off metoprolol.

## 2019-05-21 ENCOUNTER — Encounter (HOSPITAL_COMMUNITY): Payer: Self-pay | Admitting: Emergency Medicine

## 2019-05-21 ENCOUNTER — Ambulatory Visit (HOSPITAL_COMMUNITY)
Admission: EM | Admit: 2019-05-21 | Discharge: 2019-05-21 | Disposition: A | Discharge status: Home / Self Care | Payer: Medicare Other | Attending: Family Medicine | Admitting: Family Medicine

## 2019-05-21 ENCOUNTER — Other Ambulatory Visit: Payer: Self-pay

## 2019-05-21 DIAGNOSIS — R040 Epistaxis: Secondary | ICD-10-CM

## 2019-05-21 MED ORDER — OXYMETAZOLINE HCL 0.05 % NA SOLN: 1.0000 | Freq: Two times a day (BID) | NASAL | 0 refills | Status: DC

## 2019-05-21 NOTE — ED Triage Notes (Signed)
Patient had a nosebleed this morning, patient is on blood thinner.  Patient was blowing blood from nose.

## 2019-05-21 NOTE — Discharge Instructions (Signed)
Use afrin as needed for nose bleeds Return if you cannot control your bleeding with direct pressure and afrin nasal spray

## 2019-05-21 NOTE — ED Provider Notes (Signed)
[facility]    CSN: [id number] Arrival date & time: 05/21/19  1444     History   Chief Complaint Chief Complaint  Patient presents with  . Epistaxis    HPI Conner Neiss is a 73 y.o. female with history of diastolic heart failure, trach dependent, status post stroke 2 weeks ago presenting for epistaxis.  Patient states that it started this morning, resolved on its own without direct pressure or medications.  Patient wanted to get checked out "make sure everything was okay ".  Patient has multiple healthcare providers including PCP and ENT (appointment on 6/10).  Patient is now on Eliquis 5 mg twice daily since her stroke.  Patient denies gum bleeding, melena, hematochezia, bright red blood on toilet paper, hematuria.  Wondering what she should use in case she develops nosebleed again.  Patient denies headache, lightheadedness/dizziness, fatigue.   Past Medical History:  Diagnosis Date  . Anemia   . Angina   . Angioedema    2/2 ACE  . Arteriovenous malformation of stomach   . Arthritis   . Asthma   . AVM (arteriovenous malformation) of colon    small intestine; stomach  . Blood transfusion   . CHF (congestive heart failure) ([place])   . Complete heart block ([place])    s/p PPM 1998  . Complication of anesthesia    Difficult airway; anaphylaxis and swelling with propofol  . DDD (degenerative disc disease)   . Depression   . Diastolic heart failure   . Fatty liver 07/26/10  . GERD (gastroesophageal reflux disease)   . GI bleed   . Headache   . History of alcohol abuse Stopped Fall 2012  . History of tobacco use Quit Fall 2012  . Hx of cardiovascular stress test    a. Lexiscan Myoview (10/15):  Small inferolateral and apical defect c/w scar and poss soft tissue attenuation, no ischemia, EF 42%  . Hx of colonic polyp 08/13/10   adenomatous  . Hx of colonoscopy   . Hyperlipidemia   . Hyperlipidemia   . Hypertension   . Hypertrophic cardiomyopathy ([place])    dx  by Dr [name] 2009  . Iron deficiency anemia   . Myocardial infarction ([place])   . Panic attack   . Panic attacks   . Permanent atrial fibrillation   . Pneumonia   . Renal failure    baseline creatinine 1.6  . Right arm pain 01/08/2012  . RLS (restless legs syndrome)    Dx 06/2007  . Shortness of breath    sob on exertation  . Sleep apnea     Patient Active Problem List   Diagnosis Date Noted  . SOB (shortness of breath)   . Atypical chest pain   . Chest pain on exertion 05/15/2019  . Cerebral embolism with cerebral infarction 05/09/2019  . Expressive aphasia 05/08/2019  . Other abnormal glucose 11/26/2018  . Polypharmacy 06/25/2018  . Depression with anxiety   . Tracheostomy in place [facility])   . Tracheostomy care ([place])   . Calculus of gallbladder without cholecystitis without obstruction 12/24/2017  . Chronic cough 09/25/2017  . History of colonic polyps   . Moderate protein-calorie malnutrition ([place]) 06/02/2017  . Chronic idiopathic constipation 03/19/2017  . 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 Permanent atrial fibrillation   . Laryngeal stenosis 09/18/2016  . Esophageal dysphagia   . Polymyalgia rheumatica ([place]) 04/10/2015  . Hypertrophic cardiomyopathy ([place]) 02/15/2015  . Eczema 09/18/2014  . Environmental allergies 09/18/2014  . Pre-diabetes 11/26/2013  .  COPD, moderate ([place]) 11/01/2013  . Complete heart block ([place]) 04/03/2011  . Anemia 02/17/2011  . AVM (arteriovenous malformation) 02/13/2011  . PACEMAKER-[facility] 11/28/2010  . CKD (chronic kidney disease) stage 3, GFR 30-59 ml/min (H[facility]) 09/24/2010  . Acute on chronic congestive heart failure ([place]) 08/30/2010  . ARTHRITIS 07/24/2010  . Generalized anxiety disorder 07/23/2010  . Hyperlipidemia 02/11/2007  . Essential hypertension 02/11/2007  . APNEA, SLEEP 02/11/2007    Past Surgical History:  Procedure Laterality Date  . CARDIAC CATHETERIZATION    . CARDIAC CATHETERIZATION N/A 08/19/2016   Procedure: Right/Left  Heart Cath and Coronary Angiography;  Surgeon: Jolaine Artist, MD;  Location: [facility];  Service: Cardiovascular;  Laterality: N/A;  . COLONOSCOPY WITH PROPOFOL N/A 06/16/2017   Procedure: COLONOSCOPY WITH PROPOFOL;  Surgeon: Mauri Pole, MD;  Location: [facility] END[facility]COPY;  Service: Endoscopy;  Laterality: N/A;  . DIRECT LARYNGOSCOPY N/A 09/18/2016   Procedure: DIRECT LARYNGOSCOPY, BRONCHOSCOPY, REMOVAL OF INTUBATION GRANULOMA;  Surgeon: [name], MD;  [facility];  Service: ENT;  Laterality: N/A;  . DIRECT LARYNGOSCOPY N/A 10/20/2016   Procedure: EXTUBATION AND FLEXIBLE LARYNGOSCOPE;  Surgeon: [name], MD;  Location: [facility];  Service: ENT;  Laterality: N/A;  . DIRECT LARYNGOSCOPY N/A 10/29/2016   Procedure: DIRECT LARYNGOSCOPY;  Surgeon: [name], MD;  Location[facility] OR;  Service: ENT;  Laterality: N/A;  . ESOPHAGOGASTRODUODENOSCOPY  12/23/2011   Procedure: ESOPHAGOGASTRODUODENOSCOPY (EGD);  Surgeon: [name], MD;  Location: [facility];  Service: Endoscopy;  Laterality: N/A;  . ESOPHAGOGASTRODUODENOSCOPY (EGD) WITH PROPOFOL N/A 06/16/2017   Procedure: ESOPHAGOGASTRODUODENOSCOPY (EGD) WITH PROPOFOL;  Surgeon: Mauri Pole, MD;  Location: [facility];  Service: Endoscopy;  Laterality: N/A;  . EXTUBATION (ENDOTRACHEAL) IN OR N/A 07/21/2016   Procedure: EXTUBATION (ENDOTRACHEAL) IN OR;  Surgeon: [name], MD;  Location: [facility];  Service: ENT;  Laterality: N/A;  . GIVENS CAPSULE STUDY  12/23/2011   Procedure: GIVENS CAPSULE STUDY;  Surgeon: [name], MD;  Location: [facility];  Service: Endoscopy;  Laterality: N/A;  . MICROLARYNGOSCOPY WITH LASER N/A 10/16/2016   Procedure: MICRODIRECTLARYNGOSCOPY WITH LASER ABLATION AND KENLOG INJECTION;  Surgeon: [name], MD;  Location: [place] [facility];  Service: ENT;  Laterality: N/A;  . PACEMAKER INSERTION  1998   [facility], most recent gen change by [name] 4/12  . TRACHEOSTOMY TUBE PLACEMENT N/A 10/29/2016   Procedure:  TRACHEOSTOMY;  Surgeon: [name], MD;  Location: [facility];  Service: ENT;  Laterality: N/A;  . TUBAL LIGATION  04/01/2000    OB History   No obstetric history on file.      Home Medications    Prior to Admission medications   Medication Sig Start Date End Date Taking? Authorizing Provider  acetaminophen (TYLENOL) 650 MG CR tablet Take 650-1,300 mg by mouth every 8 (eight) hours as needed for pain.    [provider]  albuterol (PROVENTIL HFA;VENTOLIN HFA) 108 (90 Base) MCG/ACT inhaler Inhale 1-2 puffs into the lungs every 6 (six) hours as needed for wheezing or shortness of breath. 08/04/18   Riccio, Gardiner Rhyme, DO  allopurinol (ZYLOPRIM) 100 MG tablet TAKE 2 TABLETS(200 MG) BY MOUTH DAILY 05/18/19   Riccio, Gardiner Rhyme, DO  ALPRAZolam (XANAX) 0.5 MG tablet TAKE 1 TABLET(0.5 MG) BY MOUTH DAILY AS NEEDED FOR ANXIETY Patient taking differently: Take 0.5 mg by mouth daily.  03/24/19   Steve Rattler, DO  apixaban (ELIQUIS) 5 MG TABS tablet Take 1 tablet (5 mg total) by  mouth 2 (two) times daily. 05/11/19   Benay Pike, MD  bimatoprost (LUMIGAN) 0.01 % SOLN Place 1 drop into both eyes at bedtime.    [provider]  cetirizine (ZYRTEC) 10 MG tablet Take 10 mg by mouth daily.    [provider]  Cholecalciferol (VITAMIN D) 50 MCG (2000 UT) tablet Take 2,000 Units by mouth daily.    [provider]  ferrous sulfate 325 (65 FE) MG tablet TAKE 1 TABLET BY MOUTH TWICE DAILY WITH MEALS TO KEEP BLOOD COUNT UP Patient taking differently: Take 325 mg by mouth 2 (two) times daily with a meal.  08/26/16   Riccio, Angela C, DO  furosemide (LASIX) 40 MG tablet TAKE 1 TABLET BY MOUTH TWICE DAILY 05/18/19   Riccio, Angela C, DO  ipratropium-albuterol (DUONEB) 0.5-2.5 (3) MG/3ML SOLN USE 3 ML VIA NEBULIZER EVERY 6 HOURS Patient taking differently: Take 3 mLs by nebulization every 6 (six) hours as needed (shortness of breath).  10/21/18   Steve Rattler, DO  magnesium hydroxide  (MILK OF MAGNESIA) 400 MG/5ML suspension Take 60 mLs by mouth daily as needed for mild constipation.     [provider]  nitroGLYCERIN (NITROSTAT) 0.4 MG SL tablet Place 1 tablet (0.4 mg total) under the tongue every 5 (five) minutes as needed for chest pain. 08/04/18   Steve Rattler, DO  Olopatadine HCl 0.2 % SOLN Place 1-2 drops into both eyes daily as needed (itching).  02/17/18   [provider]  oxymetazoline (AFRIN NASAL SPRAY) 0.05 % nasal spray Place 1 spray into both nostrils 2 (two) times daily. 05/21/19   Hall-Potvin, [place], PA-C  polyethylene glycol (MIRALAX / GLYCOLAX) packet Take 17 g by mouth daily as needed for mild constipation. 11/26/18   Steve Rattler, DO  potassium chloride (K-DUR) 10 MEQ tablet Take 1 tablet (10 mEq total) by mouth daily. 05/18/19   Steve Rattler, DO  predniSONE (DELTASONE) 2.5 MG tablet Take 3 tablets (7.5 mg total) by mouth daily with breakfast. Patient not taking: Reported on 05/19/2019 05/12/19   Benay Pike, MD  rosuvastatin (CRESTOR) 20 MG tablet Take 1 tablet (20 mg total) by mouth daily. Patient taking differently: Take 20 mg by mouth at bedtime.  06/03/18   Zenia Resides, MD  sodium chloride (OCEAN) 0.65 % SOLN nasal spray Place 1 spray into both nostrils daily as needed for congestion.     [provider]  triamcinolone cream (KENALOG) 0.1 % Apply 1 application topically 2 (two) times daily. Patient taking differently: Apply 1 application topically 2 (two) times daily as needed (rash).  06/03/18   Zenia Resides, MD    Family History Family History  Problem Relation Age of Onset  . Hypertension Mother   . Stroke Mother   . Heart disease Mother   . Aneurysm Mother   . CVA Mother   . Hypertension Father   . Stroke Father   . Heart attack Father   . Alcohol abuse Father   . Hypertension Sister   . Hypertension Brother   . Colon cancer Brother   . Cancer Brother   . Diabetes Sister   . Diabetes Brother    . Heart attack Brother   . Heart attack Brother   . Heart disease Sister   . Alcohol abuse Brother   . Heart disease Daughter     Social History Social History   Tobacco Use  . Smoking status: Former Smoker  Packs/day: 0.10    Years: 50.00    Pack years: 5.00    Types: Cigarettes    Last attempt to quit: 08/16/2011    Years since quitting: 7.7  . Smokeless tobacco: Never Used  . Tobacco comment: quit 2012  Substance Use Topics  . Alcohol use: No    Comment: quit 08/16/2011  . Drug use: No     Allergies   Ace inhibitors and Other   Review of Systems As per HPI   Physical Exam Triage Vital Signs ED Triage Vitals  Enc Vitals Group     BP 05/21/19 1517 129/80     Pulse Rate 05/21/19 1517 70     Resp 05/21/19 1517 (!) 22     Temp 05/21/19 1517 98.1 F (36.7 C)     Temp Source 05/21/19 1517 Oral     SpO2 05/21/19 1517 98 %     Weight --      Height --      Head Circumference --      Peak Flow --      Pain Score 05/21/19 1515 0     Pain Loc --      Pain Edu? --      Excl. in [place]? --    No data found.  Updated Vital Signs BP 129/80 (BP Location: Left Arm)   Pulse 70   Temp 98.1 F (36.7 C) (Oral)   Resp (!) 22   SpO2 98%   Visual Acuity Right Eye Distance:   Left Eye Distance:   Bilateral Distance:    Right Eye Near:   Left Eye Near:    Bilateral Near:     Physical Exam Constitutional:      General: She is not in acute distress. HENT:     Head: Normocephalic and atraumatic.     Right Ear: Tympanic membrane, ear canal and external ear normal.     Left Ear: Tympanic membrane, ear canal and external ear normal.     Nose:     Comments: Nose without obvious deformity.  No septal deviation.  Right nare with small, anterior septal lesion consistent with nail laceration.  Scant amount of bright red blood, no active bleeding, clots, foreign body identified.    Mouth/Throat:     Mouth: Mucous membranes are moist.     Pharynx: Oropharynx is clear. No  oropharyngeal exudate or posterior oropharyngeal erythema.     Comments: No blood noted in posterior pharynx Eyes:     General: No scleral icterus.    Conjunctiva/sclera: Conjunctivae normal.     Pupils: Pupils are equal, round, and reactive to light.  Neck:     Musculoskeletal: Neck supple. No muscular tenderness.  Cardiovascular:     Rate and Rhythm: Normal rate.  Pulmonary:     Effort: Pulmonary effort is normal.  Lymphadenopathy:     Cervical: No cervical adenopathy.  Skin:    Coloration: Skin is not jaundiced or pale.  Neurological:     Mental Status: She is alert and oriented to person, place, and time.      UC Treatments / Results  Labs (all labs ordered are listed, but only abnormal results are displayed) Labs Reviewed - No data to display  EKG None  Radiology No results found.  Procedures Procedures (including critical care time)  Medications Ordered in UC Medications - No data to display  Initial Impression / Assessment and Plan / UC Course  I have reviewed the triage vital signs  and the nursing notes.  Pertinent labs & imaging results that were available during my care of the patient were reviewed by me and considered in my medical decision making (see chart for details).     Pleasant 73 year old female with history of heart failure, trach dependency, recent CVA on Eliquis presenting for anterior nosebleed.  Issue was self-limited.  Will use Afrin as needed.  Patient to keep follow-up with ENT on 6/10.  Return precautions discussed, patient verbalized understanding. Final Clinical Impressions(s) / UC Diagnoses   Final diagnoses:  Anterior epistaxis     Discharge Instructions     Use afrin as needed for nose bleeds Return if you cannot control your bleeding with direct pressure and afrin nasal spray    ED Prescriptions    Medication Sig Dispense Auth. Provider   oxymetazoline (AFRIN NASAL SPRAY) 0.05 % nasal spray Place 1 spray into both  nostrils 2 (two) times daily. 30 mL Hall-Potvin, [place], PA-C     Controlled Substance Prescriptions [place] Controlled Substance Registry consulted? Not Applicable   Quincy Sheehan, [place] 05/21/19 1604

## 2019-05-23 ENCOUNTER — Telehealth (HOSPITAL_COMMUNITY): Payer: Self-pay | Admitting: Emergency Medicine

## 2019-05-23 ENCOUNTER — Emergency Department (HOSPITAL_COMMUNITY)
Admission: EM | Admit: 2019-05-23 | Discharge: 2019-05-24 | Disposition: A | Discharge status: Home / Self Care | Payer: Medicare Other | Attending: Emergency Medicine | Admitting: Emergency Medicine

## 2019-05-23 ENCOUNTER — Other Ambulatory Visit: Payer: Self-pay

## 2019-05-23 ENCOUNTER — Emergency Department (HOSPITAL_COMMUNITY): Payer: Medicare Other

## 2019-05-23 ENCOUNTER — Encounter (HOSPITAL_COMMUNITY): Payer: Self-pay | Admitting: *Deleted

## 2019-05-23 DIAGNOSIS — Z950 Presence of cardiac pacemaker: Secondary | ICD-10-CM | POA: Insufficient documentation

## 2019-05-23 DIAGNOSIS — I130 Hypertensive heart and chronic kidney disease with heart failure and stage 1 through stage 4 chronic kidney disease, or unspecified chronic kidney disease: Secondary | ICD-10-CM | POA: Insufficient documentation

## 2019-05-23 DIAGNOSIS — R05 Cough: Secondary | ICD-10-CM | POA: Diagnosis not present

## 2019-05-23 DIAGNOSIS — Z79899 Other long term (current) drug therapy: Secondary | ICD-10-CM | POA: Insufficient documentation

## 2019-05-23 DIAGNOSIS — N183 Chronic kidney disease, stage 3 (moderate): Secondary | ICD-10-CM | POA: Insufficient documentation

## 2019-05-23 DIAGNOSIS — J449 Chronic obstructive pulmonary disease, unspecified: Secondary | ICD-10-CM | POA: Diagnosis not present

## 2019-05-23 DIAGNOSIS — Z7901 Long term (current) use of anticoagulants: Secondary | ICD-10-CM | POA: Diagnosis not present

## 2019-05-23 DIAGNOSIS — J45909 Unspecified asthma, uncomplicated: Secondary | ICD-10-CM | POA: Diagnosis not present

## 2019-05-23 DIAGNOSIS — R0602 Shortness of breath: Secondary | ICD-10-CM | POA: Diagnosis not present

## 2019-05-23 DIAGNOSIS — Z87891 Personal history of nicotine dependence: Secondary | ICD-10-CM | POA: Insufficient documentation

## 2019-05-23 DIAGNOSIS — R059 Cough, unspecified: Secondary | ICD-10-CM

## 2019-05-23 DIAGNOSIS — I5032 Chronic diastolic (congestive) heart failure: Secondary | ICD-10-CM | POA: Insufficient documentation

## 2019-05-23 DIAGNOSIS — I252 Old myocardial infarction: Secondary | ICD-10-CM | POA: Diagnosis not present

## 2019-05-23 NOTE — ED Notes (Signed)
[name] pts daughter Cell [phone] house [phone] --update as needed  Brother, [phone]

## 2019-05-23 NOTE — ED Notes (Signed)
Daughter- ada, keeps calling. If and when pt can, call her

## 2019-05-23 NOTE — Telephone Encounter (Signed)
Patient was seen recently for blood in trach.  Patient told to return if no better.  Today patient is also having increase effort in breathing.  Patient says breathing is different today and noticed this when she woke this morning.  Patient also mentions having seen blood again today as she did several days ago.    Patient has adult family member with her, agreeable to going to ED.  Dr Meda Coffee made aware of patient and agreed.

## 2019-05-23 NOTE — ED Triage Notes (Signed)
Pt in c/o continued shortness of breath and noted blood in her trach a few days ago and again today, pt was seen at that time, pt also noted to be coughing, no distress noted

## 2019-05-24 DIAGNOSIS — E785 Hyperlipidemia, unspecified: Secondary | ICD-10-CM | POA: Diagnosis not present

## 2019-05-24 DIAGNOSIS — J449 Chronic obstructive pulmonary disease, unspecified: Secondary | ICD-10-CM | POA: Diagnosis not present

## 2019-05-24 DIAGNOSIS — I442 Atrioventricular block, complete: Secondary | ICD-10-CM | POA: Diagnosis not present

## 2019-05-24 DIAGNOSIS — N183 Chronic kidney disease, stage 3 (moderate): Secondary | ICD-10-CM | POA: Diagnosis not present

## 2019-05-24 DIAGNOSIS — M1A9XX0 Chronic gout, unspecified, without tophus (tophi): Secondary | ICD-10-CM | POA: Diagnosis not present

## 2019-05-24 DIAGNOSIS — D508 Other iron deficiency anemias: Secondary | ICD-10-CM | POA: Diagnosis not present

## 2019-05-24 DIAGNOSIS — I700 Atherosclerosis of aorta: Secondary | ICD-10-CM | POA: Diagnosis not present

## 2019-05-24 DIAGNOSIS — M353 Polymyalgia rheumatica: Secondary | ICD-10-CM | POA: Diagnosis not present

## 2019-05-24 DIAGNOSIS — K219 Gastro-esophageal reflux disease without esophagitis: Secondary | ICD-10-CM | POA: Diagnosis not present

## 2019-05-24 DIAGNOSIS — I69320 Aphasia following cerebral infarction: Secondary | ICD-10-CM | POA: Diagnosis not present

## 2019-05-24 DIAGNOSIS — I422 Other hypertrophic cardiomyopathy: Secondary | ICD-10-CM | POA: Diagnosis not present

## 2019-05-24 DIAGNOSIS — G4733 Obstructive sleep apnea (adult) (pediatric): Secondary | ICD-10-CM | POA: Diagnosis not present

## 2019-05-24 DIAGNOSIS — I5032 Chronic diastolic (congestive) heart failure: Secondary | ICD-10-CM | POA: Diagnosis not present

## 2019-05-24 DIAGNOSIS — J386 Stenosis of larynx: Secondary | ICD-10-CM | POA: Diagnosis not present

## 2019-05-24 DIAGNOSIS — D631 Anemia in chronic kidney disease: Secondary | ICD-10-CM | POA: Diagnosis not present

## 2019-05-24 DIAGNOSIS — I252 Old myocardial infarction: Secondary | ICD-10-CM | POA: Diagnosis not present

## 2019-05-24 DIAGNOSIS — I209 Angina pectoris, unspecified: Secondary | ICD-10-CM | POA: Diagnosis not present

## 2019-05-24 DIAGNOSIS — R1310 Dysphagia, unspecified: Secondary | ICD-10-CM | POA: Diagnosis not present

## 2019-05-24 DIAGNOSIS — M1990 Unspecified osteoarthritis, unspecified site: Secondary | ICD-10-CM | POA: Diagnosis not present

## 2019-05-24 DIAGNOSIS — R7303 Prediabetes: Secondary | ICD-10-CM | POA: Diagnosis not present

## 2019-05-24 DIAGNOSIS — I4821 Permanent atrial fibrillation: Secondary | ICD-10-CM | POA: Diagnosis not present

## 2019-05-24 DIAGNOSIS — I130 Hypertensive heart and chronic kidney disease with heart failure and stage 1 through stage 4 chronic kidney disease, or unspecified chronic kidney disease: Secondary | ICD-10-CM | POA: Diagnosis not present

## 2019-05-24 DIAGNOSIS — Q2733 Arteriovenous malformation of digestive system vessel: Secondary | ICD-10-CM | POA: Diagnosis not present

## 2019-05-24 NOTE — ED Provider Notes (Signed)
[facility] EMERGENCY DEPARTMENT Provider Note   CSN: [id number] Arrival date & time: 05/23/19  1824    History   Chief Complaint Chief Complaint  Patient presents with   Shortness of Breath    HPI [name] is a 73 y.o. female.     The history is provided by the patient.  Cough  Cough characteristics:  Productive Severity:  Moderate Onset quality:  Gradual Timing:  Intermittent Progression:  Unchanged Chronicity:  Recurrent Relieved by:  Nothing Worsened by:  Nothing Associated symptoms: no fever   Patient with history of multiple medical conditions including trach presents with increasing cough and reportedly having bloody mucus from her trach.  She also report recent shortness of breath.  No fevers or vomiting. She is post to see her ear nose and throat specialist on June 10  Past Medical History:  Diagnosis Date   Anemia    Angina    Angioedema    2/2 ACE   Arteriovenous malformation of stomach    Arthritis    Asthma    AVM (arteriovenous malformation) of colon    small intestine; stomach   Blood transfusion    CHF (congestive heart failure) (HCC)    Complete heart block (HCC)    s/p PPM 0932   Complication of anesthesia    Difficult airway; anaphylaxis and swelling with propofol   DDD (degenerative disc disease)    Depression    Diastolic heart failure    Fatty liver 07/26/10   GERD (gastroesophageal reflux disease)    GI bleed    Headache    History of alcohol abuse Stopped Fall 2012   History of tobacco use Quit Fall 2012   Hx of cardiovascular stress test    a. Lexiscan Myoview (10/15):  Small inferolateral and apical defect c/w scar and poss soft tissue attenuation, no ischemia, EF 42%   Hx of colonic polyp 08/13/10   adenomatous   Hx of colonoscopy    Hyperlipidemia    Hyperlipidemia    Hypertension    Hypertrophic cardiomyopathy ([place])    dx by Dr [name] 2009   Iron deficiency  anemia    Myocardial infarction [facility])    Panic attack    Panic attacks    Permanent atrial fibrillation    Pneumonia    Renal failure    baseline creatinine 1.6   Right arm pain 01/08/2012   RLS (restless legs syndrome)    Dx 06/2007   Shortness of breath    sob on exertation   Sleep apnea     Patient Active Problem List   Diagnosis Date Noted   SOB (shortness of breath)    Atypical chest pain    Chest pain on exertion 05/15/2019   Cerebral embolism with cerebral infarction 05/09/2019   Expressive aphasia 05/08/2019   Other abnormal glucose 11/26/2018   Polypharmacy 06/25/2018   Depression with anxiety    Tracheostomy in place [facility])    Tracheostomy care ([place])    Calculus of gallbladder without cholecystitis without obstruction 12/24/2017   Chronic cough 09/25/2017   History of colonic polyps    Moderate protein-calorie malnutrition ([place]) 06/02/2017   Chronic idiopathic constipation 03/19/2017   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   Permanent atrial fibrillation    Laryngeal stenosis 09/18/2016   Esophageal dysphagia    Polymyalgia rheumatica ([place]) 04/10/2015   Hypertrophic cardiomyopathy ([place]) 02/15/2015   Eczema 09/18/2014   Environmental allergies 09/18/2014   Pre-diabetes 11/26/2013   COPD, moderate ([place])  11/01/2013   Complete heart block (HCC) 04/03/2011   Anemia 02/17/2011   AVM (arteriovenous malformation) 02/13/2011   PACEMAKER-[place] 11/28/2010   CKD (chronic kidney disease) stage 3, GFR 30-59 ml/min (HCC) 09/24/2010   Acute on chronic congestive heart failure ([place]) 08/30/2010   ARTHRITIS 07/24/2010   Generalized anxiety disorder 07/23/2010   Hyperlipidemia 02/11/2007   Essential hypertension 02/11/2007   APNEA, SLEEP 02/11/2007    Past Surgical History:  Procedure Laterality Date   CARDIAC CATHETERIZATION     CARDIAC CATHETERIZATION N/A 08/19/2016   Procedure: Right/Left Heart Cath and Coronary Angiography;   Surgeon: Jolaine Artist, MD;  Location: [facility] LAB;  Service: Cardiovascular;  Laterality: N/A;   COLONOSCOPY WITH PROPOFOL N/A 06/16/2017   Procedure: COLONOSCOPY WITH PROPOFOL;  Surgeon: Mauri Pole, MD;  Location: [facility];  Service: Endoscopy;  Laterality: N/A;   DIRECT LARYNGOSCOPY N/A 09/18/2016   Procedure: DIRECT LARYNGOSCOPY, BRONCHOSCOPY, REMOVAL OF INTUBATION GRANULOMA;  Surgeon: [name], MD;  Location: [facility] OR;  Service: ENT;  Laterality: N/A;   DIRECT LARYNGOSCOPY N/A 10/20/2016   Procedure: EXTUBATION AND FLEXIBLE LARYNGOSCOPE;  Surgeon: [name], MD;  Location: [facility] OR;  Service: ENT;  Laterality: N/A;   DIRECT LARYNGOSCOPY N/A 10/29/2016   Procedure: DIRECT LARYNGOSCOPY;  Surgeon: [name], MD;  Location: [facility] OR;  Service: ENT;  Laterality: N/A;   ESOPHAGOGASTRODUODENOSCOPY  12/23/2011   Procedure: ESOPHAGOGASTRODUODENOSCOPY (EGD);  Surgeon: [name], MD;  Location: [facility];  Service: Endoscopy;  Laterality: N/A;   ESOPHAGOGASTRODUODENOSCOPY (EGD) WITH PROPOFOL N/A 06/16/2017   Procedure: ESOPHAGOGASTRODUODENOSCOPY (EGD) WITH PROPOFOL;  Surgeon: Mauri Pole, MD;  Location: [facility];  Service: Endoscopy;  Laterality: N/A;   EXTUBATION (ENDOTRACHEAL) IN OR N/A 07/21/2016   Procedure: EXTUBATION (ENDOTRACHEAL) IN OR;  Surgeon: [name], MD;  Location: [facility];  Service: ENT;  Laterality: N/A;   [place] STUDY  12/23/2011   Procedure: GIVENS CAPSULE STUDY;  Surgeon: [name], MD;  Location: [facility];  Service: Endoscopy;  Laterality: N/A;   MICROLARYNGOSCOPY WITH LASER N/A 10/16/2016   Procedure: MICRODIRECTLARYNGOSCOPY WITH LASER ABLATION AND KENLOG INJECTION;  Surgeon: [name], MD;  Location: [facility];  Service: ENT;  Laterality: N/A;   [place], most recent gen change by JA 4/12   TRACHEOSTOMY TUBE PLACEMENT N/A 10/29/2016   Procedure: TRACHEOSTOMY;  Surgeon: [name], MD;   Location: [facility];  Service: ENT;  Laterality: N/A;   TUBAL LIGATION  04/01/2000     OB History   No obstetric history on file.      Home Medications    Prior to Admission medications   Medication Sig Start Date End Date Taking? Authorizing Provider  acetaminophen (TYLENOL) 650 MG CR tablet Take 650-1,300 mg by mouth every 8 (eight) hours as needed for pain.    [provider]  albuterol (PROVENTIL HFA;VENTOLIN HFA) 108 (90 Base) MCG/ACT inhaler Inhale 1-2 puffs into the lungs every 6 (six) hours as needed for wheezing or shortness of breath. 08/04/18   Riccio, Gardiner Rhyme, DO  allopurinol (ZYLOPRIM) 100 MG tablet TAKE 2 TABLETS(200 MG) BY MOUTH DAILY 05/18/19   Riccio, Gardiner Rhyme, DO  ALPRAZolam (XANAX) 0.5 MG tablet TAKE 1 TABLET(0.5 MG) BY MOUTH DAILY AS NEEDED FOR ANXIETY Patient taking differently: Take 0.5 mg by mouth daily.  03/24/19   Steve Rattler, DO  apixaban (ELIQUIS) 5 MG TABS tablet Take 1 tablet (5 mg total) by mouth 2 (  two) times daily. 05/11/19   Benay Pike, MD  bimatoprost (LUMIGAN) 0.01 % SOLN Place 1 drop into both eyes at bedtime.    [provider]  cetirizine (ZYRTEC) 10 MG tablet Take 10 mg by mouth daily.    [provider]  Cholecalciferol (VITAMIN D) 50 MCG (2000 UT) tablet Take 2,000 Units by mouth daily.    [provider]  ferrous sulfate 325 (65 FE) MG tablet TAKE 1 TABLET BY MOUTH TWICE DAILY WITH MEALS TO KEEP BLOOD COUNT UP Patient taking differently: Take 325 mg by mouth 2 (two) times daily with a meal.  08/26/16   Riccio, Angela C, DO  furosemide (LASIX) 40 MG tablet TAKE 1 TABLET BY MOUTH TWICE DAILY 05/18/19   Riccio, Angela C, DO  ipratropium-albuterol (DUONEB) 0.5-2.5 (3) MG/3ML SOLN USE 3 ML VIA NEBULIZER EVERY 6 HOURS Patient taking differently: Take 3 mLs by nebulization every 6 (six) hours as needed (shortness of breath).  10/21/18   Steve Rattler, DO  magnesium hydroxide (MILK OF MAGNESIA) 400 MG/5ML suspension  Take 60 mLs by mouth daily as needed for mild constipation.     [provider]  nitroGLYCERIN (NITROSTAT) 0.4 MG SL tablet Place 1 tablet (0.4 mg total) under the tongue every 5 (five) minutes as needed for chest pain. 08/04/18   Steve Rattler, DO  Olopatadine HCl 0.2 % SOLN Place 1-2 drops into both eyes daily as needed (itching).  02/17/18   [provider]  oxymetazoline (AFRIN NASAL SPRAY) 0.05 % nasal spray Place 1 spray into both nostrils 2 (two) times daily. 05/21/19   Hall-Potvin, [place], PA-C  polyethylene glycol (MIRALAX / GLYCOLAX) packet Take 17 g by mouth daily as needed for mild constipation. 11/26/18   Steve Rattler, DO  potassium chloride (K-DUR) 10 MEQ tablet Take 1 tablet (10 mEq total) by mouth daily. 05/18/19   Steve Rattler, DO  predniSONE (DELTASONE) 2.5 MG tablet Take 3 tablets (7.5 mg total) by mouth daily with breakfast. Patient not taking: Reported on 05/19/2019 05/12/19   Benay Pike, MD  rosuvastatin (CRESTOR) 20 MG tablet Take 1 tablet (20 mg total) by mouth daily. Patient taking differently: Take 20 mg by mouth at bedtime.  06/03/18   Zenia Resides, MD  sodium chloride (OCEAN) 0.65 % SOLN nasal spray Place 1 spray into both nostrils daily as needed for congestion.     [provider]  triamcinolone cream (KENALOG) 0.1 % Apply 1 application topically 2 (two) times daily. Patient taking differently: Apply 1 application topically 2 (two) times daily as needed (rash).  06/03/18   Zenia Resides, MD    Family History Family History  Problem Relation Age of Onset   Hypertension Mother    Stroke Mother    Heart disease Mother    Aneurysm Mother    CVA Mother    Hypertension Father    Stroke Father    Heart attack Father    Alcohol abuse Father    Hypertension Sister    Hypertension Brother    Colon cancer Brother    Cancer Brother    Diabetes Sister    Diabetes Brother    Heart attack Brother    Heart  attack Brother    Heart disease Sister    Alcohol abuse Brother    Heart disease Daughter     Social History Social History   Tobacco Use   Smoking status: Former Smoker    Packs/day:  0.10    Years: 50.00    Pack years: 5.00    Types: Cigarettes    Last attempt to quit: 08/16/2011    Years since quitting: 7.7   Smokeless tobacco: Never Used   Tobacco comment: quit 2012  Substance Use Topics   Alcohol use: No    Comment: quit 08/16/2011   Drug use: No     Allergies   Ace inhibitors and Other   Review of Systems Review of Systems  Constitutional: Negative for fever.  Respiratory: Positive for cough.   Gastrointestinal: Negative for vomiting.  All other systems reviewed and are negative.    Physical Exam Updated Vital Signs BP 127/84 (BP Location: Right Arm)    Pulse 61    Temp 98.5 F (36.9 C) (Oral)    Resp 18    SpO2 100%   Physical Exam CONSTITUTIONAL: elderly, no distress HEAD: Normocephalic/atraumatic EYES: EOMI/PERRL ENMT: Mucous membranes moist, no blood noted in either nare No blood in oropharynx NECK: supple no meningeal signs, trach in place, no blood noted around the trach, no discharge, no blood noted in the trach CV: S1/S2 noted LUNGS: Lungs are clear to auscultation bilaterally, no apparent distress ABDOMEN: soft, nontender NEURO: Pt is awake/alert/appropriate EXTREMITIES: pulses normal/equal, full ROM SKIN: warm, color normal PSYCH: no abnormalities of mood noted, alert and oriented to situation   ED Treatments / Results  Labs (all labs ordered are listed, but only abnormal results are displayed) Labs Reviewed - No data to display  EKG EKG Interpretation  Date/Time:  Monday May 23 2019 18:38:48 EDT Ventricular Rate:  70 PR Interval:    QRS Duration: 212 QT Interval:  508 QTC Calculation: 548 R Axis:   -82 Text Interpretation:  Ventricular-paced rhythm Abnormal ECG No significant change since last tracing Confirmed by  Ripley Fraise [phone]) on 05/24/2019 3:36:22 AM   Radiology Dg Chest 2 View  Result Date: 05/23/2019 CLINICAL DATA:  73 year old female with shortness of breath and recurrent blood in tracheostomy. EXAM: CHEST - 2 VIEW COMPARISON:  05/15/2019 and earlier. FINDINGS: Tracheostomy tube appears stable with no adverse features. Chronic right chest cardiac pacemaker is stable. Stable cardiomegaly and mediastinal contours. Lung volumes at baseline. Pulmonary vascularity appears to be at baseline. No pneumothorax, pleural effusion, acute or confluent opacity. No acute osseous abnormality identified. Negative visible bowel gas pattern. IMPRESSION: No acute cardiopulmonary abnormality. Electronically Signed   By: Genevie Ann M.D.   On: 05/23/2019 19:22    Procedures Procedures  Medications Ordered in ED Medications - No data to display   Initial Impression / Assessment and Plan / ED Course  I have reviewed the triage vital signs and the nursing notes.  Pertinent imaging results that were available during my care of the patient were reviewed by me and considered in my medical decision making (see chart for details).        Patient Presents for ninth ER visit in 6 months.  She recently was seen for epistaxis, now reporting blood from her trach.  She has been suctioned here without any difficulty, no bloody secretions noted.  Chest x-ray is negative.  No acute distress, no hypoxia.  She has an ENT follow-up within 24 hours. Will d/c home 5:46 AM Patient stable, no hypoxia. Multiple attempts to contact family have been unsuccessful.  However I feel she is appropriate for discharge home without any decompensation over the past 11 hours in the ER Final Clinical Impressions(s) / ED Diagnoses   Final diagnoses:  Cough    ED Discharge Orders    None       Ripley Fraise, MD 05/24/19 [phone]

## 2019-05-25 DIAGNOSIS — G4733 Obstructive sleep apnea (adult) (pediatric): Secondary | ICD-10-CM | POA: Diagnosis not present

## 2019-05-25 DIAGNOSIS — J386 Stenosis of larynx: Secondary | ICD-10-CM | POA: Diagnosis not present

## 2019-05-25 DIAGNOSIS — Z430 Encounter for attention to tracheostomy: Secondary | ICD-10-CM | POA: Diagnosis not present

## 2019-05-25 DIAGNOSIS — R040 Epistaxis: Secondary | ICD-10-CM | POA: Diagnosis not present

## 2019-05-26 DIAGNOSIS — J449 Chronic obstructive pulmonary disease, unspecified: Secondary | ICD-10-CM | POA: Diagnosis not present

## 2019-05-26 DIAGNOSIS — M1A9XX0 Chronic gout, unspecified, without tophus (tophi): Secondary | ICD-10-CM | POA: Diagnosis not present

## 2019-05-26 DIAGNOSIS — I130 Hypertensive heart and chronic kidney disease with heart failure and stage 1 through stage 4 chronic kidney disease, or unspecified chronic kidney disease: Secondary | ICD-10-CM | POA: Diagnosis not present

## 2019-05-26 DIAGNOSIS — I700 Atherosclerosis of aorta: Secondary | ICD-10-CM | POA: Diagnosis not present

## 2019-05-26 DIAGNOSIS — I5032 Chronic diastolic (congestive) heart failure: Secondary | ICD-10-CM | POA: Diagnosis not present

## 2019-05-26 DIAGNOSIS — Q2733 Arteriovenous malformation of digestive system vessel: Secondary | ICD-10-CM | POA: Diagnosis not present

## 2019-05-26 DIAGNOSIS — I209 Angina pectoris, unspecified: Secondary | ICD-10-CM | POA: Diagnosis not present

## 2019-05-26 DIAGNOSIS — R7303 Prediabetes: Secondary | ICD-10-CM | POA: Diagnosis not present

## 2019-05-26 DIAGNOSIS — E785 Hyperlipidemia, unspecified: Secondary | ICD-10-CM | POA: Diagnosis not present

## 2019-05-26 DIAGNOSIS — I252 Old myocardial infarction: Secondary | ICD-10-CM | POA: Diagnosis not present

## 2019-05-26 DIAGNOSIS — D631 Anemia in chronic kidney disease: Secondary | ICD-10-CM | POA: Diagnosis not present

## 2019-05-26 DIAGNOSIS — I442 Atrioventricular block, complete: Secondary | ICD-10-CM | POA: Diagnosis not present

## 2019-05-26 DIAGNOSIS — I422 Other hypertrophic cardiomyopathy: Secondary | ICD-10-CM | POA: Diagnosis not present

## 2019-05-26 DIAGNOSIS — N183 Chronic kidney disease, stage 3 (moderate): Secondary | ICD-10-CM | POA: Diagnosis not present

## 2019-05-26 DIAGNOSIS — M353 Polymyalgia rheumatica: Secondary | ICD-10-CM | POA: Diagnosis not present

## 2019-05-26 DIAGNOSIS — R1310 Dysphagia, unspecified: Secondary | ICD-10-CM | POA: Diagnosis not present

## 2019-05-26 DIAGNOSIS — I69320 Aphasia following cerebral infarction: Secondary | ICD-10-CM | POA: Diagnosis not present

## 2019-05-26 DIAGNOSIS — J386 Stenosis of larynx: Secondary | ICD-10-CM | POA: Diagnosis not present

## 2019-05-26 DIAGNOSIS — G4733 Obstructive sleep apnea (adult) (pediatric): Secondary | ICD-10-CM | POA: Diagnosis not present

## 2019-05-26 DIAGNOSIS — I4821 Permanent atrial fibrillation: Secondary | ICD-10-CM | POA: Diagnosis not present

## 2019-05-26 DIAGNOSIS — K219 Gastro-esophageal reflux disease without esophagitis: Secondary | ICD-10-CM | POA: Diagnosis not present

## 2019-05-26 DIAGNOSIS — D508 Other iron deficiency anemias: Secondary | ICD-10-CM | POA: Diagnosis not present

## 2019-05-26 DIAGNOSIS — M1990 Unspecified osteoarthritis, unspecified site: Secondary | ICD-10-CM | POA: Diagnosis not present

## 2019-05-27 ENCOUNTER — Other Ambulatory Visit: Payer: Self-pay

## 2019-05-27 NOTE — Patient Outreach (Signed)
[facility]) Care Management  05/27/2019  [name] [birth year] [id number]  Referral date:05/17/19 Referral source:hospital liaison Insurance:[employer] Patients listed primary MD office completes transition of care  Telephone call to patient's son, [name].  HIPAA verified for patient by son.  Son states patient is doing great.  He states home health speech therapist was contacted for patient.  Son states patients home speech therapy visits have resumed.  Son states patient had a small amount of bleeding from her trachea on yesterday. Son states the bleeding has stopped. Son states patient saw the ENT doctor on 05/25/19 to assess her trachea.   Son denies any further needs or concerns for patient. Son verbalized appreciation of RNCM follow up call.   Son states patient received the [facility] care management brochure/ magnet.  RNCM advised son to contact 24 hour nurse call line if and / or when needed.  Son verbalized understanding.  RNCM advised patient to notify MD of any changes in condition prior to scheduled appointment. RNCM verified patient aware of 911 services for urgent/ emergent needs.  PLAN; RNCM will close case due to patient being assessed and having no further needs.   Quinn Plowman RN,BSN,CCM [facility] Telephonic  [phone]

## 2019-05-31 DIAGNOSIS — I130 Hypertensive heart and chronic kidney disease with heart failure and stage 1 through stage 4 chronic kidney disease, or unspecified chronic kidney disease: Secondary | ICD-10-CM | POA: Diagnosis not present

## 2019-05-31 DIAGNOSIS — J449 Chronic obstructive pulmonary disease, unspecified: Secondary | ICD-10-CM | POA: Diagnosis not present

## 2019-05-31 DIAGNOSIS — D631 Anemia in chronic kidney disease: Secondary | ICD-10-CM | POA: Diagnosis not present

## 2019-05-31 DIAGNOSIS — Q2733 Arteriovenous malformation of digestive system vessel: Secondary | ICD-10-CM | POA: Diagnosis not present

## 2019-05-31 DIAGNOSIS — M1990 Unspecified osteoarthritis, unspecified site: Secondary | ICD-10-CM | POA: Diagnosis not present

## 2019-05-31 DIAGNOSIS — I700 Atherosclerosis of aorta: Secondary | ICD-10-CM | POA: Diagnosis not present

## 2019-05-31 DIAGNOSIS — I209 Angina pectoris, unspecified: Secondary | ICD-10-CM | POA: Diagnosis not present

## 2019-05-31 DIAGNOSIS — I442 Atrioventricular block, complete: Secondary | ICD-10-CM | POA: Diagnosis not present

## 2019-05-31 DIAGNOSIS — I5032 Chronic diastolic (congestive) heart failure: Secondary | ICD-10-CM | POA: Diagnosis not present

## 2019-05-31 DIAGNOSIS — K219 Gastro-esophageal reflux disease without esophagitis: Secondary | ICD-10-CM | POA: Diagnosis not present

## 2019-05-31 DIAGNOSIS — E785 Hyperlipidemia, unspecified: Secondary | ICD-10-CM | POA: Diagnosis not present

## 2019-05-31 DIAGNOSIS — R7303 Prediabetes: Secondary | ICD-10-CM | POA: Diagnosis not present

## 2019-05-31 DIAGNOSIS — J386 Stenosis of larynx: Secondary | ICD-10-CM | POA: Diagnosis not present

## 2019-05-31 DIAGNOSIS — I69320 Aphasia following cerebral infarction: Secondary | ICD-10-CM | POA: Diagnosis not present

## 2019-05-31 DIAGNOSIS — N183 Chronic kidney disease, stage 3 (moderate): Secondary | ICD-10-CM | POA: Diagnosis not present

## 2019-05-31 DIAGNOSIS — G4733 Obstructive sleep apnea (adult) (pediatric): Secondary | ICD-10-CM | POA: Diagnosis not present

## 2019-05-31 DIAGNOSIS — D508 Other iron deficiency anemias: Secondary | ICD-10-CM | POA: Diagnosis not present

## 2019-05-31 DIAGNOSIS — I252 Old myocardial infarction: Secondary | ICD-10-CM | POA: Diagnosis not present

## 2019-05-31 DIAGNOSIS — M353 Polymyalgia rheumatica: Secondary | ICD-10-CM | POA: Diagnosis not present

## 2019-05-31 DIAGNOSIS — I422 Other hypertrophic cardiomyopathy: Secondary | ICD-10-CM | POA: Diagnosis not present

## 2019-05-31 DIAGNOSIS — I4821 Permanent atrial fibrillation: Secondary | ICD-10-CM | POA: Diagnosis not present

## 2019-05-31 DIAGNOSIS — R1310 Dysphagia, unspecified: Secondary | ICD-10-CM | POA: Diagnosis not present

## 2019-05-31 DIAGNOSIS — M1A9XX0 Chronic gout, unspecified, without tophus (tophi): Secondary | ICD-10-CM | POA: Diagnosis not present

## 2019-05-31 NOTE — Telephone Encounter (Signed)
LVM for pre reg, my pending, no email

## 2019-06-02 ENCOUNTER — Telehealth: Payer: Self-pay | Admitting: Cardiovascular Disease

## 2019-06-02 ENCOUNTER — Telehealth: Payer: Self-pay | Admitting: Physician Assistant

## 2019-06-02 ENCOUNTER — Telehealth: Payer: Self-pay | Admitting: *Deleted

## 2019-06-02 DIAGNOSIS — M1990 Unspecified osteoarthritis, unspecified site: Secondary | ICD-10-CM | POA: Diagnosis not present

## 2019-06-02 DIAGNOSIS — G4733 Obstructive sleep apnea (adult) (pediatric): Secondary | ICD-10-CM | POA: Diagnosis not present

## 2019-06-02 DIAGNOSIS — K219 Gastro-esophageal reflux disease without esophagitis: Secondary | ICD-10-CM | POA: Diagnosis not present

## 2019-06-02 DIAGNOSIS — E785 Hyperlipidemia, unspecified: Secondary | ICD-10-CM | POA: Diagnosis not present

## 2019-06-02 DIAGNOSIS — J449 Chronic obstructive pulmonary disease, unspecified: Secondary | ICD-10-CM | POA: Diagnosis not present

## 2019-06-02 DIAGNOSIS — J386 Stenosis of larynx: Secondary | ICD-10-CM | POA: Diagnosis not present

## 2019-06-02 DIAGNOSIS — I69320 Aphasia following cerebral infarction: Secondary | ICD-10-CM | POA: Diagnosis not present

## 2019-06-02 DIAGNOSIS — I422 Other hypertrophic cardiomyopathy: Secondary | ICD-10-CM | POA: Diagnosis not present

## 2019-06-02 DIAGNOSIS — N183 Chronic kidney disease, stage 3 (moderate): Secondary | ICD-10-CM | POA: Diagnosis not present

## 2019-06-02 DIAGNOSIS — I700 Atherosclerosis of aorta: Secondary | ICD-10-CM | POA: Diagnosis not present

## 2019-06-02 DIAGNOSIS — I442 Atrioventricular block, complete: Secondary | ICD-10-CM | POA: Diagnosis not present

## 2019-06-02 DIAGNOSIS — M1A9XX0 Chronic gout, unspecified, without tophus (tophi): Secondary | ICD-10-CM | POA: Diagnosis not present

## 2019-06-02 DIAGNOSIS — Q2733 Arteriovenous malformation of digestive system vessel: Secondary | ICD-10-CM | POA: Diagnosis not present

## 2019-06-02 DIAGNOSIS — R1310 Dysphagia, unspecified: Secondary | ICD-10-CM | POA: Diagnosis not present

## 2019-06-02 DIAGNOSIS — I4821 Permanent atrial fibrillation: Secondary | ICD-10-CM | POA: Diagnosis not present

## 2019-06-02 DIAGNOSIS — D631 Anemia in chronic kidney disease: Secondary | ICD-10-CM | POA: Diagnosis not present

## 2019-06-02 DIAGNOSIS — D508 Other iron deficiency anemias: Secondary | ICD-10-CM | POA: Diagnosis not present

## 2019-06-02 DIAGNOSIS — M353 Polymyalgia rheumatica: Secondary | ICD-10-CM | POA: Diagnosis not present

## 2019-06-02 DIAGNOSIS — I252 Old myocardial infarction: Secondary | ICD-10-CM | POA: Diagnosis not present

## 2019-06-02 DIAGNOSIS — I209 Angina pectoris, unspecified: Secondary | ICD-10-CM | POA: Diagnosis not present

## 2019-06-02 DIAGNOSIS — I130 Hypertensive heart and chronic kidney disease with heart failure and stage 1 through stage 4 chronic kidney disease, or unspecified chronic kidney disease: Secondary | ICD-10-CM | POA: Diagnosis not present

## 2019-06-02 DIAGNOSIS — I5032 Chronic diastolic (congestive) heart failure: Secondary | ICD-10-CM | POA: Diagnosis not present

## 2019-06-02 DIAGNOSIS — R7303 Prediabetes: Secondary | ICD-10-CM | POA: Diagnosis not present

## 2019-06-02 NOTE — Telephone Encounter (Signed)
New message     *STAT* If patient is at the pharmacy, call can be transferred to refill team.   1. Which medications need to be refilled? (please list name of each medication and dose if known) apixaban (ELIQUIS) 5 MG TABS tabletapixaban (ELIQUIS) 5 MG TABS tablet  2. Which pharmacy/location (including street and city if local pharmacy) is medication to be sent to?[facility] on [address], [place], [place]  3. Do they need a 30 day or 90 day supply? [place]

## 2019-06-02 NOTE — Telephone Encounter (Signed)
VO left on VM.  °

## 2019-06-02 NOTE — Telephone Encounter (Signed)
Spoke to [facility] advised pt has a virtual appointment with Fabian Sharp PA 06/06/19 at 8:45 am and GI appt with Dr.Nandigam 6/24/at 8:30 am.

## 2019-06-02 NOTE — Telephone Encounter (Signed)
New message   Patient wants to verify does need an an appointment for a GI doctor that was setup?  Please call.

## 2019-06-02 NOTE — Telephone Encounter (Signed)
Ebony Hail from [facility] calling for SP verbal orders as follows:  2 time(s) weekly for 4 week(s)  You can leave verbal orders on confidential voicemail.  Christen Bame, CMA

## 2019-06-02 NOTE — Telephone Encounter (Signed)
call home phone/ consent/ my chart/ pre reg completed °

## 2019-06-03 ENCOUNTER — Telehealth: Payer: Self-pay | Admitting: Physician Assistant

## 2019-06-03 DIAGNOSIS — D649 Anemia, unspecified: Secondary | ICD-10-CM | POA: Diagnosis not present

## 2019-06-03 DIAGNOSIS — Z930 Tracheostomy status: Secondary | ICD-10-CM | POA: Diagnosis not present

## 2019-06-03 DIAGNOSIS — I4891 Unspecified atrial fibrillation: Secondary | ICD-10-CM | POA: Diagnosis not present

## 2019-06-03 DIAGNOSIS — J449 Chronic obstructive pulmonary disease, unspecified: Secondary | ICD-10-CM | POA: Diagnosis not present

## 2019-06-03 DIAGNOSIS — M6281 Muscle weakness (generalized): Secondary | ICD-10-CM | POA: Diagnosis not present

## 2019-06-03 NOTE — Telephone Encounter (Signed)
LVM for patient on both home and cell phone regarding 06-06-19 appt.

## 2019-06-05 NOTE — Progress Notes (Signed)
Virtual Visit via Telephone Note   This visit type was conducted due to national recommendations for restrictions regarding the COVID-19 Pandemic (e.g. social distancing) in an effort to limit this patient's exposure and mitigate transmission in our community.  Due to her co-morbid illnesses, this patient is at least at moderate risk for complications without adequate follow up.  This format is felt to be most appropriate for this patient at this time.  The patient did not have access to video technology/had technical difficulties with video requiring transitioning to audio format only (telephone).  All issues noted in this document were discussed and addressed.  No physical exam could be performed with this format.  Please refer to the patient's chart for her  consent to telehealth for [facility].   Date:  06/06/2019   ID:  [name], DOB 03-Oct-1946, MRN [id number]  Patient Location: Home Provider Location: Office  PCP:  Steve Rattler, DO  Cardiologist:  Skeet Latch, MD  Electrophysiologist:  Thompson Grayer, MD   Evaluation Performed:  Follow-Up Visit  Chief Complaint:  Follow up, eliquis  History of Present Illness:    [name] is a 73 y.o. female with hypertension, hyperlipidemia, anemia of chronic disease, chronic diastolic heart failure, CKD stage II, permanent atrial fibrillation and complete heart block status post pacemaker placement in 1998, COPD, GERD, OSA and laryngeal stenosis status post trach, and polymyalgia rheumatica.  She has a history of a GI bleed in 2012 from gastric AVMs, and was therefore not on anticoagulation for atrial fibrillation.  She has normal coronary arteries by heart cath in 08/2016.  Echocardiogram 05/09/19 with a preserved EF 60 to 65%, but severely increased left ventricular wall thickness consistent with hypertrophic cardiomyopathy.  She was last seen in clinic on 09/23/2018 by Dr. Rayann Heman.  She was doing well at that  time.  Pacemaker function was appropriate.  Since that visit she has had multiple ER visits.   She was recently hospitalized from 05/08/2019 through 05/11/2019 for new onset stroke with expressive aphasia.  She initially presented for shortness of breath which she had attributed to a buildup of mucus in her tracheostomy.  CT head showed possible stroke in the lower left parietal lobe.  MRI was not obtained due to pacemaker placement.  She does have a history of GI bleed in 2012.  GI was consulted and she was deemed a candidate for anticoagulation.  She was started on Eliquis 5 mg twice daily. Since that time, she had a negative work up in the ER 05/15/19 for CP and SOB. Cardiology was consulted and no further workup deemed necessary, she was discharged on 05/17/19.  I saw her in follow up 05/19/19. At that time, she reported blood in her stool. Given her baseline anemia of chronic disease with a baseline Hb of 9, I contacted Dr. Silverio Decamp, her GI doctor. Per her instructions, repeat CBC was obtained and her Hb was stable. However, BMP with elevated creatinine. She was asked to follow up with her nephrologist ASAP.  Since then, she had an urgent care evaluation for epistaxis, resolved with afrin, and ER visit for blood in her trach, which was successfully suctions in the ER without blood.   She presents today for follow up. She is still taking eliquis 5 mg BID. She never followed up with nephrology. The daughter, [name], states her mother has had a stroke and her memory is not great so we shouldn't be relaying messages directly to the patient.. I asked  her if I could relay messages through her in addition to the patient, but [name] said its difficult to reach her on the phone. She advised me to call the patient's mobile number listed in Epic.  We will call and get an appt with Dr. Louie Boston office for her renal function. She is scheduled to see Dr. Silverio Decamp on Wed, 6/24. I reminded her of the appt.   From a cardiac  standpoint, she seems to be doing well. She denies lower extremity swelling and is doing well on daily lasix. She denies any further bleeding problems.    The patient does not have symptoms concerning for COVID-19 infection (fever, chills, cough, or new shortness of breath).     Past Medical History:  Diagnosis Date  . Anemia   . Angina   . Angioedema    2/2 ACE  . Arteriovenous malformation of stomach   . Arthritis   . Asthma   . AVM (arteriovenous malformation) of colon    small intestine; stomach  . Blood transfusion   . CHF (congestive heart failure) ([place])   . Complete heart block ([place])    s/p PPM 1998  . Complication of anesthesia    Difficult airway; anaphylaxis and swelling with propofol  . DDD (degenerative disc disease)   . Depression   . Diastolic heart failure   . Fatty liver 07/26/10  . GERD (gastroesophageal reflux disease)   . GI bleed   . Headache   . History of alcohol abuse Stopped Fall 2012  . History of tobacco use Quit Fall 2012  . Hx of cardiovascular stress test    a. Lexiscan Myoview (10/15):  Small inferolateral and apical defect c/w scar and poss soft tissue attenuation, no ischemia, EF 42%  . Hx of colonic polyp 08/13/10   adenomatous  . Hx of colonoscopy   . Hyperlipidemia   . Hyperlipidemia   . Hypertension   . Hypertrophic cardiomyopathy ([place])    dx by Dr [name] 2009  . Iron deficiency anemia   . Myocardial infarction ([place])   . Panic attack   . Panic attacks   . Permanent atrial fibrillation   . Pneumonia   . Renal failure    baseline creatinine 1.6  . Right arm pain 01/08/2012  . RLS (restless legs syndrome)    Dx 06/2007  . Shortness of breath    sob on exertation  . Sleep apnea    Past Surgical History:  Procedure Laterality Date  . CARDIAC CATHETERIZATION    . CARDIAC CATHETERIZATION N/A 08/19/2016   Procedure: Right/Left Heart Cath and Coronary Angiography;  Surgeon: Jolaine Artist, MD;  Location: [place] [facility];   Service: Cardiovascular;  Laterality: N/A;  . COLONOSCOPY WITH PROPOFOL N/A 06/16/2017   Procedure: COLONOSCOPY WITH PROPOFOL;  Surgeon: Mauri Pole, MD;  Location: [facility];  Service: Endoscopy;  Laterality: N/A;  . DIRECT LARYNGOSCOPY N/A 09/18/2016   Procedure: DIRECT LARYNGOSCOPY, BRONCHOSCOPY, REMOVAL OF INTUBATION GRANULOMA;  Surgeon: [name], MD;  Location[facility];  Service: ENT;  Laterality: N/A;  . DIRECT LARYNGOSCOPY N/A 10/20/2016   Procedure: EXTUBATION AND FLEXIBLE LARYNGOSCOPE;  Surgeon: [name], MD;  Location: [place];  Service: ENT;  Laterality: N/A;  . DIRECT LARYNGOSCOPY N/A 10/29/2016   Procedure: DIRECT LARYNGOSCOPY;  Surgeon: [name], MD;  Location: [facility] OR;  Service: ENT;  Laterality: N/A;  . ESOPHAGOGASTRODUODENOSCOPY  12/23/2011   Procedure: ESOPHAGOGASTRODUODENOSCOPY (EGD);  Surgeon: [name], MD;  Location: [facility];  Service: Endoscopy;  Laterality: N/A;  . ESOPHAGOGASTRODUODENOSCOPY (EGD) WITH PROPOFOL N/A 06/16/2017   Procedure: ESOPHAGOGASTRODUODENOSCOPY (EGD) WITH PROPOFOL;  Surgeon: Mauri Pole, MD;  Location: [facility];  Service: Endoscopy;  Laterality: N/A;  . EXTUBATION (ENDOTRACHEAL) IN OR N/A 07/21/2016   Procedure: EXTUBATION (ENDOTRACHEAL) IN OR;  Surgeon: [name], MD;  Location: [place];  Service: ENT;  Laterality: N/A;  . GIVENS CAPSULE STUDY  12/23/2011   Procedure: GIVENS CAPSULE STUDY;  Surgeon: [name], MD;  Location: [facility];  Service: Endoscopy;  Laterality: N/A;  . MICROLARYNGOSCOPY WITH LASER N/A 10/16/2016   Procedure: MICRODIRECTLARYNGOSCOPY WITH LASER ABLATION AND KENLOG INJECTION;  Surgeon: [name], MD;  Location: [place];  Service: ENT;  Laterality: N/A;  . PACEMAKER INSERTION  1998   st jude, most recent gen change by [name] 4/12  . TRACHEOSTOMY TUBE PLACEMENT N/A 10/29/2016   Procedure: TRACHEOSTOMY;  Surgeon: [name], MD;  Location: [place];  Service: ENT;  Laterality: N/A;  . TUBAL  LIGATION  04/01/2000     Current Meds  Medication Sig  . acetaminophen (TYLENOL) 650 MG CR tablet Take 650-1,300 mg by mouth every 8 (eight) hours as needed for pain.  [name] albuterol (PROVENTIL HFA;VENTOLIN HFA) 108 (90 Base) MCG/ACT inhaler Inhale 1-2 puffs into the lungs every 6 (six) hours as needed for wheezing or shortness of breath.  . allopurinol (ZYLOPRIM) 100 MG tablet TAKE 2 TABLETS(200 MG) BY MOUTH DAILY  . ALPRAZolam (XANAX) 0.5 MG tablet TAKE 1 TABLET(0.5 MG) BY MOUTH DAILY AS NEEDED FOR ANXIETY (Patient taking differently: Take 0.5 mg by mouth daily. )  . apixaban (ELIQUIS) 5 MG TABS tablet Take 1 tablet (5 mg total) by mouth 2 (two) times daily.  . bimatoprost (LUMIGAN) 0.01 % SOLN Place 1 drop into both eyes at bedtime.  . cetirizine (ZYRTEC) 10 MG tablet Take 10 mg by mouth daily.  . Cholecalciferol (VITAMIN D) 50 MCG (2000 UT) tablet Take 2,000 Units by mouth daily.  . ferrous sulfate 325 (65 FE) MG tablet TAKE 1 TABLET BY MOUTH TWICE DAILY WITH MEALS TO KEEP BLOOD COUNT UP (Patient taking differently: Take 325 mg by mouth 2 (two) times daily with a meal. )  . furosemide (LASIX) 40 MG tablet TAKE 1 TABLET BY MOUTH TWICE DAILY  . ipratropium-albuterol (DUONEB) 0.5-2.5 (3) MG/3ML SOLN USE 3 ML VIA NEBULIZER EVERY 6 HOURS (Patient taking differently: Take 3 mLs by nebulization every 6 (six) hours as needed (shortness of breath). )  . magnesium hydroxide (MILK OF MAGNESIA) 400 MG/5ML suspension Take 60 mLs by mouth daily as needed for mild constipation.   . nitroGLYCERIN (NITROSTAT) 0.4 MG SL tablet Place 1 tablet (0.4 mg total) under the tongue every 5 (five) minutes as needed for chest pain.  [name] Olopatadine HCl 0.2 % SOLN Place 1-2 drops into both eyes daily as needed (itching).   [name] oxymetazoline (AFRIN NASAL SPRAY) 0.05 % nasal spray Place 1 spray into both nostrils 2 (two) times daily.  . polyethylene glycol (MIRALAX / GLYCOLAX) packet Take 17 g by mouth daily as needed for mild  constipation.  . potassium chloride (K-DUR) 10 MEQ tablet Take 1 tablet (10 mEq total) by mouth daily.  . rosuvastatin (CRESTOR) 20 MG tablet Take 1 tablet (20 mg total) by mouth daily. (Patient taking differently: Take 20 mg by mouth at bedtime. )  . sodium chloride (OCEAN) 0.65 % SOLN nasal spray Place 1 spray into both nostrils daily as needed for congestion.   [name]  triamcinolone cream (KENALOG) 0.1 % Apply 1 application topically 2 (two) times daily. (Patient taking differently: Apply 1 application topically 2 (two) times daily as needed (rash). )     Allergies:   Ace inhibitors and Other   Social History   Tobacco Use  . Smoking status: Former Smoker    Packs/day: 0.10    Years: 50.00    Pack years: 5.00    Types: Cigarettes    Quit date: 08/16/2011    Years since quitting: 7.8  . Smokeless tobacco: Never Used  . Tobacco comment: quit 2012  Substance Use Topics  . Alcohol use: No    Comment: quit 08/16/2011  . Drug use: No     Family Hx: The patient's family history includes Alcohol abuse in her brother and father; Aneurysm in her mother; CVA in her mother; Cancer in her brother; Colon cancer in her brother; Diabetes in her brother and sister; Heart attack in her brother, brother, and father; Heart disease in her daughter, mother, and sister; Hypertension in her brother, father, mother, and sister; Stroke in her father and mother.  ROS:   Please see the history of present illness.     All other systems reviewed and are negative.   Prior CV studies:   The following studies were reviewed today:  Echo 05/09/19: 1. The left ventricle has hyperdynamic systolic function, with an ejection fraction of >65%. The cavity size was normal. There is severely increased left ventricular wall thickness. Findings are consistent with hypertrophic cardiomyopathy. Left  ventricular diastolic Doppler parameters are consistent with pseudonormalization. 2. The right ventricle has normal systolic  function. The cavity was normal. There is no increase in right ventricular wall thickness. 3. Left atrial size was moderately dilated. 4. Right atrial size was moderately dilated. 5. Mitral valve regurgitation is moderate by color flow Doppler. 6. The aortic valve is tricuspid. Mild thickening of the aortic valve. Mild calcification of the aortic valve.  Labs/Other Tests and Data Reviewed:    EKG:  An ECG dated 05/16/19 was personally reviewed today and demonstrated:  V-paced rhythm, heart rate 70, appears similar to prior tracings  Recent Labs: 05/08/2019: ALT 10 05/09/2019: Magnesium 1.8 05/15/2019: B Natriuretic Peptide 158.6 05/19/2019: BUN 54; Creatinine, Ser 2.02; Hemoglobin 8.7; Platelets 266; Potassium 3.7; Sodium 138   Recent Lipid Panel Lab Results  Component Value Date/Time   CHOL 136 05/09/2019 04:02 AM   CHOL 168 07/16/2017 03:55 PM   TRIG 99 05/09/2019 04:02 AM   HDL 60 05/09/2019 04:02 AM   HDL 48 07/16/2017 03:55 PM   CHOLHDL 2.3 05/09/2019 04:02 AM   LDLCALC 56 05/09/2019 04:02 AM   LDLCALC 99 07/16/2017 03:55 PM   LDLDIRECT 161.8 02/21/2013 02:16 PM    Wt Readings from Last 3 Encounters:  06/06/19 159 lb (72.1 kg)  05/19/19 158 lb (71.7 kg)  05/17/19 158 lb 1.1 oz (71.7 kg)     Objective:    Vital Signs:  BP 106/61   Pulse 65   Ht 5\' 5"  (1.651 m)   Wt 159 lb (72.1 kg)   BMI 26.46 kg/m    VITAL SIGNS:  reviewed GEN:  no acute distress RESPIRATORY:  normal respiratory effort, symmetric expansion NEURO:  alert and oriented, answers questions PSYCH:  normal affect  ASSESSMENT & PLAN:     1. Permanent Afib 2. Complete heart block 3. Pacemaker present 4. Hx of Gi bleed (2012) 5. Recent stroke 6. Chronic anticoagulation with eliquis She seems to be  doing well on eliquis. It is imperative that she follows up with GI and nephrology. We will attempt to make an appt for her. I will leave repeat CBC to GI.   7. Chronic diastolic heart failure 8.  Hypertrophic cardiomyopathy - pt does not complain of signs and symptoms of volume overload - continue daily lasix   9. Tracheostomy present - stable   10. Renal insufficiency at least stage III - will make her an appt with Dr. Carolin Sicks - baseline sCr may be near 1.6 - I will ask nephrology to repeat labs so she does not have to come into our office to reduce exposures   Follow up with [name] in 3 months. It would be more appropriate for her to have an in-office visit, virtual is difficult given the patient's baseline.     COVID-19 Education: The signs and symptoms of COVID-19 were discussed with the patient and how to seek care for testing (follow up with PCP or arrange E-visit).  The importance of social distancing was discussed today.  Time:   Today, I have spent 19 minutes with the patient with telehealth technology discussing the above problems.     Medication Adjustments/Labs and Tests Ordered: Current medicines are reviewed at length with the patient today.  Concerns regarding medicines are outlined above.   Tests Ordered: Orders Placed This Encounter  Procedures  . Ambulatory referral to Nephrology    Medication Changes: No orders of the defined types were placed in this encounter.   Follow Up:  In Person in 3 month(s)  Signed, Ledora Bottcher, [place]  06/06/2019 9:06 AM    [place]

## 2019-06-06 ENCOUNTER — Encounter: Payer: Self-pay | Admitting: Physician Assistant

## 2019-06-06 ENCOUNTER — Telehealth (INDEPENDENT_AMBULATORY_CARE_PROVIDER_SITE_OTHER): Payer: Medicare Other | Admitting: Physician Assistant

## 2019-06-06 VITALS — BP 106/61 | HR 65 | Ht 65.0 in | Wt 159.0 lb

## 2019-06-06 DIAGNOSIS — N183 Chronic kidney disease, stage 3 unspecified: Secondary | ICD-10-CM

## 2019-06-06 DIAGNOSIS — I442 Atrioventricular block, complete: Secondary | ICD-10-CM

## 2019-06-06 DIAGNOSIS — I5032 Chronic diastolic (congestive) heart failure: Secondary | ICD-10-CM | POA: Diagnosis not present

## 2019-06-06 DIAGNOSIS — I421 Obstructive hypertrophic cardiomyopathy: Secondary | ICD-10-CM

## 2019-06-06 DIAGNOSIS — I10 Essential (primary) hypertension: Secondary | ICD-10-CM

## 2019-06-06 DIAGNOSIS — Z950 Presence of cardiac pacemaker: Secondary | ICD-10-CM

## 2019-06-06 DIAGNOSIS — I4821 Permanent atrial fibrillation: Secondary | ICD-10-CM | POA: Diagnosis not present

## 2019-06-06 MED ORDER — APIXABAN 5 MG PO TABS: 5.0000 mg | ORAL_TABLET | Freq: Two times a day (BID) | ORAL | 1 refills | Status: DC

## 2019-06-06 NOTE — Addendum Note (Signed)
Addended by: Zenovia Jarred on: 06/06/2019 07:46 AM   Modules accepted: Orders

## 2019-06-06 NOTE — Patient Instructions (Signed)
Medication Instructions:  Your physician recommends that you continue on your current medications as directed. Please refer to the Current Medication list given to you today.  If you need a refill on your cardiac medications before your next appointment, please call your pharmacy.    Follow-Up: At [facility], you and your health needs are our priority.  As part of our continuing mission to provide you with exceptional heart care, we have created designated Provider Care Teams.  These Care Teams include your primary Cardiologist (physician) and Advanced Practice Providers (APPs -  Physician Assistants and Nurse Practitioners) who all work together to provide you with the care you need, when you need it. You will need a follow up appointment in 3 months (September).  Please call our office in advance to schedule this appointment.  You may see Skeet Latch, MD or one of the following Advanced Practice Providers on your designated Care Team:   Kerin Ransom, PA-C Roby Lofts, [place] . Sande Rives, PA-C  Any Other Special Instructions Will Be Listed Below (If Applicable). Dr. Louie Boston office will call you to schedule an appointment.

## 2019-06-06 NOTE — Telephone Encounter (Signed)
Pt is a 73 yr old female. Her last wt. 78.9Kg. SCr is 2.02 on 05/19/19. Pt had a telemed appt on 05/19/19 with PA. Will refill Eliquis 5mg  BID.

## 2019-06-07 ENCOUNTER — Encounter: Payer: Self-pay | Admitting: *Deleted

## 2019-06-07 ENCOUNTER — Encounter (HOSPITAL_COMMUNITY): Payer: Medicare Other

## 2019-06-07 DIAGNOSIS — N183 Chronic kidney disease, stage 3 (moderate): Secondary | ICD-10-CM | POA: Diagnosis not present

## 2019-06-07 DIAGNOSIS — K219 Gastro-esophageal reflux disease without esophagitis: Secondary | ICD-10-CM | POA: Diagnosis not present

## 2019-06-07 DIAGNOSIS — I442 Atrioventricular block, complete: Secondary | ICD-10-CM | POA: Diagnosis not present

## 2019-06-07 DIAGNOSIS — I422 Other hypertrophic cardiomyopathy: Secondary | ICD-10-CM | POA: Diagnosis not present

## 2019-06-07 DIAGNOSIS — I252 Old myocardial infarction: Secondary | ICD-10-CM | POA: Diagnosis not present

## 2019-06-07 DIAGNOSIS — I700 Atherosclerosis of aorta: Secondary | ICD-10-CM | POA: Diagnosis not present

## 2019-06-07 DIAGNOSIS — D631 Anemia in chronic kidney disease: Secondary | ICD-10-CM | POA: Diagnosis not present

## 2019-06-07 DIAGNOSIS — R1310 Dysphagia, unspecified: Secondary | ICD-10-CM | POA: Diagnosis not present

## 2019-06-07 DIAGNOSIS — M1A9XX0 Chronic gout, unspecified, without tophus (tophi): Secondary | ICD-10-CM | POA: Diagnosis not present

## 2019-06-07 DIAGNOSIS — I209 Angina pectoris, unspecified: Secondary | ICD-10-CM | POA: Diagnosis not present

## 2019-06-07 DIAGNOSIS — I69320 Aphasia following cerebral infarction: Secondary | ICD-10-CM | POA: Diagnosis not present

## 2019-06-07 DIAGNOSIS — E785 Hyperlipidemia, unspecified: Secondary | ICD-10-CM | POA: Diagnosis not present

## 2019-06-07 DIAGNOSIS — J449 Chronic obstructive pulmonary disease, unspecified: Secondary | ICD-10-CM | POA: Diagnosis not present

## 2019-06-07 DIAGNOSIS — M1990 Unspecified osteoarthritis, unspecified site: Secondary | ICD-10-CM | POA: Diagnosis not present

## 2019-06-07 DIAGNOSIS — Q2733 Arteriovenous malformation of digestive system vessel: Secondary | ICD-10-CM | POA: Diagnosis not present

## 2019-06-07 DIAGNOSIS — D508 Other iron deficiency anemias: Secondary | ICD-10-CM | POA: Diagnosis not present

## 2019-06-07 DIAGNOSIS — I130 Hypertensive heart and chronic kidney disease with heart failure and stage 1 through stage 4 chronic kidney disease, or unspecified chronic kidney disease: Secondary | ICD-10-CM | POA: Diagnosis not present

## 2019-06-07 DIAGNOSIS — R7303 Prediabetes: Secondary | ICD-10-CM | POA: Diagnosis not present

## 2019-06-07 DIAGNOSIS — I5032 Chronic diastolic (congestive) heart failure: Secondary | ICD-10-CM | POA: Diagnosis not present

## 2019-06-07 DIAGNOSIS — J386 Stenosis of larynx: Secondary | ICD-10-CM | POA: Diagnosis not present

## 2019-06-07 DIAGNOSIS — G4733 Obstructive sleep apnea (adult) (pediatric): Secondary | ICD-10-CM | POA: Diagnosis not present

## 2019-06-07 DIAGNOSIS — M353 Polymyalgia rheumatica: Secondary | ICD-10-CM | POA: Diagnosis not present

## 2019-06-07 DIAGNOSIS — I4821 Permanent atrial fibrillation: Secondary | ICD-10-CM | POA: Diagnosis not present

## 2019-06-08 ENCOUNTER — Ambulatory Visit (INDEPENDENT_AMBULATORY_CARE_PROVIDER_SITE_OTHER): Payer: Medicare Other | Admitting: Gastroenterology

## 2019-06-08 ENCOUNTER — Encounter: Payer: Self-pay | Admitting: Gastroenterology

## 2019-06-08 VITALS — Ht 64.0 in | Wt 161.0 lb

## 2019-06-08 DIAGNOSIS — I4891 Unspecified atrial fibrillation: Secondary | ICD-10-CM | POA: Diagnosis not present

## 2019-06-08 DIAGNOSIS — D649 Anemia, unspecified: Secondary | ICD-10-CM | POA: Diagnosis not present

## 2019-06-08 DIAGNOSIS — M6281 Muscle weakness (generalized): Secondary | ICD-10-CM | POA: Diagnosis not present

## 2019-06-08 DIAGNOSIS — D500 Iron deficiency anemia secondary to blood loss (chronic): Secondary | ICD-10-CM | POA: Diagnosis not present

## 2019-06-08 DIAGNOSIS — Z930 Tracheostomy status: Secondary | ICD-10-CM | POA: Diagnosis not present

## 2019-06-08 DIAGNOSIS — J449 Chronic obstructive pulmonary disease, unspecified: Secondary | ICD-10-CM | POA: Diagnosis not present

## 2019-06-08 NOTE — Patient Instructions (Addendum)
Schedule small bowel video capsule for further evaluation of iron deficiency anemia  Continue oral iron  Follow-up virtual visit in 6 to 8 weeks  I appreciate the  opportunity to care for you  Thank You   Harl Bowie , MD

## 2019-06-08 NOTE — Progress Notes (Signed)
[name]    [id number]    April 12, 1946  Primary Care Physician:Riccio, Gardiner Rhyme, DO  Referring Physician: Steve Rattler, DO [place],  [place] [id number]  This service was provided via audio and video telemedicine (Doximity) due to [place] 19 pandemic.  Patient location: Home Provider location: Office Used 2 patient identifiers to confirm the correct person. Explained the limitations in evaluation and management via telemedicine. Patient is aware of potential medical charges for this visit.  Patient consented to this virtual visit.  The persons participating in this telemedicine service were myself, her daughter and the patient  Interactive audio and video telecommunications were attempted between this provider and patient, however failed, due to patient having technical difficulties OR patient did not have access to video capability. We continued and completed visit with audio only.  Time spent on call:14 minutes  Chief complaint:  Rectal bleeding, anemia  HPI: 73 year old African-American female with chronic anemia, gastric AVMs here for follow-up visit  Relevant GI history:EGD in January 2013 with gastric AVM status post APC. History of adenomatous colon polyps and family history of colon cancer (brother diagnosed with colon cancer in his 39s). Last colonoscopy in August 2011 with removal of 4 small polyps is due for surveillance colonoscopy in 2016. Capsule endoscopy January 2013 was poor prep with no significant lesion to explain iron deficiency anemia.  EGD June 16, 2017: Normal Colonoscopy June 16, 2017 removal of 8 polyps, tubular adenomas  She has chronic anemia with hemoglobin ranging from 9-10, most recent hemoglobin 8.7 on May 19, 2019  She has history of A. fib on chronic anticoagulation, congestive heart failure, status post pacemaker, tracheostomy and chronic kidney disease.  Denies any odynophagia, dysphagia, vomiting, abdominal  pain or change in bowel habits.  She was having intermittent bright red blood per rectum from hemorrhoids that has since resolved CBC    Component Value Date/Time   WBC 8.3 05/19/2019 1133   WBC 5.1 05/17/2019 0645   RBC 2.83 (L) 05/19/2019 1133   RBC 3.11 (L) 05/17/2019 0645   HGB 8.7 (L) 05/19/2019 1133   HCT 27.1 (L) 05/19/2019 1133   PLT 266 05/19/2019 1133   MCV 96 05/19/2019 1133   MCH 30.7 05/19/2019 1133   MCH 29.3 05/17/2019 0645   MCHC 32.1 05/19/2019 1133   MCHC 31.3 05/17/2019 0645   RDW 14.6 05/19/2019 1133   LYMPHSABS 0.9 05/15/2019 1645   MONOABS 0.3 05/15/2019 1645   EOSABS 0.0 05/15/2019 1645   BASOSABS 0.0 05/15/2019 1645   Iron/TIBC/Ferritin/ %Sat    Component Value Date/Time   IRON 74 03/08/2019 1210   TIBC 375 03/08/2019 1210   FERRITIN 292 03/08/2019 1210   IRONPCTSAT 20 03/08/2019 1210   IRONPCTSAT 17 (L) 02/06/2014 1500    Outpatient Encounter Medications as of 06/08/2019  Medication Sig  . acetaminophen (TYLENOL) 650 MG CR tablet Take 650-1,300 mg by mouth every 8 (eight) hours as needed for pain.  [name] albuterol (PROVENTIL HFA;VENTOLIN HFA) 108 (90 Base) MCG/ACT inhaler Inhale 1-2 puffs into the lungs every 6 (six) hours as needed for wheezing or shortness of breath.  . allopurinol (ZYLOPRIM) 100 MG tablet TAKE 2 TABLETS(200 MG) BY MOUTH DAILY  . ALPRAZolam (XANAX) 0.5 MG tablet TAKE 1 TABLET(0.5 MG) BY MOUTH DAILY AS NEEDED FOR ANXIETY (Patient taking differently: Take 0.5 mg by mouth daily. )  . apixaban (ELIQUIS) 5 MG TABS tablet Take 1 tablet (5  mg total) by mouth 2 (two) times daily.  . bimatoprost (LUMIGAN) 0.01 % SOLN Place 1 drop into both eyes at bedtime.  . cetirizine (ZYRTEC) 10 MG tablet Take 10 mg by mouth daily.  . Cholecalciferol (VITAMIN D) 50 MCG (2000 UT) tablet Take 2,000 Units by mouth daily.  . ferrous sulfate 325 (65 FE) MG tablet TAKE 1 TABLET BY MOUTH TWICE DAILY WITH MEALS TO KEEP BLOOD COUNT UP (Patient taking differently: Take  325 mg by mouth 2 (two) times daily with a meal. )  . furosemide (LASIX) 40 MG tablet TAKE 1 TABLET BY MOUTH TWICE DAILY  . ipratropium-albuterol (DUONEB) 0.5-2.5 (3) MG/3ML SOLN USE 3 ML VIA NEBULIZER EVERY 6 HOURS (Patient taking differently: Take 3 mLs by nebulization every 6 (six) hours as needed (shortness of breath). )  . magnesium hydroxide (MILK OF MAGNESIA) 400 MG/5ML suspension Take 60 mLs by mouth daily as needed for mild constipation.   . nitroGLYCERIN (NITROSTAT) 0.4 MG SL tablet Place 1 tablet (0.4 mg total) under the tongue every 5 (five) minutes as needed for chest pain.  [name] Olopatadine HCl 0.2 % SOLN Place 1-2 drops into both eyes daily as needed (itching).   [name] oxymetazoline (AFRIN NASAL SPRAY) 0.05 % nasal spray Place 1 spray into both nostrils 2 (two) times daily.  . polyethylene glycol (MIRALAX / GLYCOLAX) packet Take 17 g by mouth daily as needed for mild constipation.  . potassium chloride (K-DUR) 10 MEQ tablet Take 1 tablet (10 mEq total) by mouth daily.  . rosuvastatin (CRESTOR) 20 MG tablet Take 1 tablet (20 mg total) by mouth daily. (Patient taking differently: Take 20 mg by mouth at bedtime. )  . sodium chloride (OCEAN) 0.65 % SOLN nasal spray Place 1 spray into both nostrils daily as needed for congestion.   . triamcinolone cream (KENALOG) 0.1 % Apply 1 application topically 2 (two) times daily. (Patient taking differently: Apply 1 application topically 2 (two) times daily as needed (rash). )   No facility-administered encounter medications on file as of 06/08/2019.     Allergies as of 06/08/2019 - Review Complete 06/06/2019  Allergen Reaction Noted  . Ace inhibitors Swelling 03/15/2008  . Other Anaphylaxis and Swelling 10/15/2016    Past Medical History:  Diagnosis Date  . Anemia   . Angina   . Angioedema    2/2 ACE  . Arteriovenous malformation of stomach   . Arthritis   . Asthma   . AVM (arteriovenous malformation) of colon    small intestine; stomach  .  Blood transfusion   . CHF (congestive heart failure) ([place])   . Complete heart block ([place])    s/p PPM 1998  . Complication of anesthesia    Difficult airway; anaphylaxis and swelling with propofol  . DDD (degenerative disc disease)   . Depression   . Diastolic heart failure   . Fatty liver 07/26/10  . GERD (gastroesophageal reflux disease)   . GI bleed   . Headache   . History of alcohol abuse Stopped Fall 2012  . History of tobacco use Quit Fall 2012  . Hx of cardiovascular stress test    a. Lexiscan Myoview (10/15):  Small inferolateral and apical defect c/w scar and poss soft tissue attenuation, no ischemia, EF 42%  . Hx of colonic polyp 08/13/10   adenomatous  . Hx of colonoscopy   . Hyperlipidemia   . Hyperlipidemia   . Hypertension   . Hypertrophic cardiomyopathy ([place])    dx by  Dr [name] 2009  . Iron deficiency anemia   . Myocardial infarction ([place])   . Panic attack   . Panic attacks   . Permanent atrial fibrillation   . Pneumonia   . Renal failure    baseline creatinine 1.6  . Right arm pain 01/08/2012  . RLS (restless legs syndrome)    Dx 06/2007  . Shortness of breath    sob on exertation  . Sleep apnea     Past Surgical History:  Procedure Laterality Date  . CARDIAC CATHETERIZATION    . CARDIAC CATHETERIZATION N/A 08/19/2016   Procedure: Right/Left Heart Cath and Coronary Angiography;  Surgeon: Jolaine Artist, MD;  Location: [facility];  Service: Cardiovascular;  Laterality: N/A;  . COLONOSCOPY WITH PROPOFOL N/A 06/16/2017   Procedure: COLONOSCOPY WITH PROPOFOL;  Surgeon: Mauri Pole, MD;  Location: [facility];  Service: Endoscopy;  Laterality: N/A;  . DIRECT LARYNGOSCOPY N/A 09/18/2016   Procedure: DIRECT LARYNGOSCOPY, BRONCHOSCOPY, REMOVAL OF INTUBATION GRANULOMA;  Surgeon: [name], MD;  Location: [facility] OR;  Service: ENT;  Laterality: N/A;  . DIRECT LARYNGOSCOPY N/A 10/20/2016   Procedure: EXTUBATION AND FLEXIBLE LARYNGOSCOPE;  Surgeon:  [name], MD;  Location: [facility];  Service: ENT;  Laterality: N/A;  . DIRECT LARYNGOSCOPY N/A 10/29/2016   Procedure: DIRECT LARYNGOSCOPY;  Surgeon: [name], MD;  [facility];  Service: ENT;  Laterality: N/A;  . ESOPHAGOGASTRODUODENOSCOPY  12/23/2011   Procedure: ESOPHAGOGASTRODUODENOSCOPY (EGD);  Surgeon: [name], MD;  Location: [facility];  Service: Endoscopy;  Laterality: N/A;  . ESOPHAGOGASTRODUODENOSCOPY (EGD) WITH PROPOFOL N/A 06/16/2017   Procedure: ESOPHAGOGASTRODUODENOSCOPY (EGD) WITH PROPOFOL;  Surgeon: Mauri Pole, MD;  [facility];  Service: Endoscopy;  Laterality: N/A;  . EXTUBATION (ENDOTRACHEAL) IN OR N/A 07/21/2016   Procedure: EXTUBATION (ENDOTRACHEAL) IN OR;  Surgeon: [name], MD;  Location: [facility];  Service: ENT;  Laterality: N/A;  . GIVENS CAPSULE STUDY  12/23/2011   Procedure: GIVENS CAPSULE STUDY;  Surgeon: [name], MD;  Location: [facility];  Service: Endoscopy;  Laterality: N/A;  . MICROLARYNGOSCOPY WITH LASER N/A 10/16/2016   Procedure: MICRODIRECTLARYNGOSCOPY WITH LASER ABLATION AND KENLOG INJECTION;  Surgeon: [name], MD;  Location: [facility];  Service: ENT;  Laterality: N/A;  . PACEMAKER INSERTION  1998   st jude, most recent gen change by [name] 4/12  . TRACHEOSTOMY TUBE PLACEMENT N/A 10/29/2016   Procedure: TRACHEOSTOMY;  Surgeon: [name], MD;  Location: [facility];  Service: ENT;  Laterality: N/A;  . TUBAL LIGATION  04/01/2000    Family History  Problem Relation Age of Onset  . Hypertension Mother   . Stroke Mother   . Heart disease Mother   . Aneurysm Mother   . CVA Mother   . Hypertension Father   . Stroke Father   . Heart attack Father   . Alcohol abuse Father   . Hypertension Sister   . Hypertension Brother   . Colon cancer Brother   . Cancer Brother   . Diabetes Sister   . Diabetes Brother   . Heart attack Brother   . Heart attack Brother   . Heart disease Sister   . Alcohol abuse Brother   . Heart  disease Daughter     Social History   Socioeconomic History  . Marital status: Divorced    Spouse name: Not on file  . Number of children: 4  . Years of education: 9  . Highest education level: Not  on file  Occupational History  . Occupation: disabled    Fish farm manager: UNEMPLOYED  . Occupation: previously- Development worker, community  Social Needs  . Financial resource strain: Not on file  . Food insecurity    Worry: Not on file    Inability: Not on file  . Transportation needs    Medical: Not on file    Non-medical: Not on file  Tobacco Use  . Smoking status: Former Smoker    Packs/day: 0.10    Years: 50.00    Pack years: 5.00    Types: Cigarettes    Quit date: 08/16/2011    Years since quitting: 7.8  . Smokeless tobacco: Never Used  . Tobacco comment: quit 2012  Substance and Sexual Activity  . Alcohol use: No    Comment: quit 08/16/2011  . Drug use: No  . Sexual activity: Never  Lifestyle  . Physical activity    Days per week: Not on file    Minutes per session: Not on file  . Stress: Not on file  Relationships  . Social Herbalist on phone: Not on file    Gets together: Not on file    Attends religious service: Not on file    Active member of club or organization: Not on file    Attends meetings of clubs or organizations: Not on file    Relationship status: Not on file  . Intimate partner violence    Fear of current or ex partner: Not on file    Emotionally abused: Not on file    Physically abused: Not on file    Forced sexual activity: Not on file  Other Topics Concern  . Not on file  Social History Narrative   On disability for heart failure/pacemaker.     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    4 grown children,  8 grandchildren.     Drinks alcohol a few times a week socially. At one time, the most she reports drinking is about 3 mixed drinks.   Lives with daughter [name] and son. Will be moving 04/2011 to different house because concerned  children are taking advantage of her financial situation.    No illicit drugs.   Smokes few cigarettes daily, especially when stressed. Started smoking @ age 53. Quit January 2012 2/2 health but started smoking again 02/2011.       Health Care POA:    Emergency Contact: daughter, [name] [phone]   End of Life Plan: gave pt AD info pamphlet   Who lives with you: no one- lives in section 8 housing   Any pets: none   Diet: Pt has a varied diet of protein, starch and vegetables   Exercise: Pt has no regular exercise routine.   Seatbelts: Pt reports wearing seatbelt when in vehicles.    Hobbies: dancing      Review of systems: Review of Systems as per HPI All other systems reviewed and are negative.   Physical Exam: Vitals were not taken and physical exam was not performed during this virtual visit.  Data Reviewed:  Reviewed labs, radiology imaging, old records and pertinent past GI work up   Assessment and Plan/Recommendations:  73 year old female with multiple comorbidities including laryngeal tracheal stenosis status post tracheostomy, AV malformation, complete heart block status post pacemaker, COPD, CHF, hypertension, A. fib on chronic anticoagulation Chronic anemia likely multifactorial but will need to exclude chronic GI blood loss History of gastric AVM in the past, none on  EGD in 2018.  Small bowel video capsule was poor prep.  Schedule small bowel video capsule Continue oral iron  Follow-up in 6 to 8 weeks    K. Denzil Magnuson , MD   CC: Steve Rattler, DO

## 2019-06-09 ENCOUNTER — Telehealth: Payer: Self-pay | Admitting: *Deleted

## 2019-06-09 DIAGNOSIS — I209 Angina pectoris, unspecified: Secondary | ICD-10-CM | POA: Diagnosis not present

## 2019-06-09 DIAGNOSIS — I4821 Permanent atrial fibrillation: Secondary | ICD-10-CM | POA: Diagnosis not present

## 2019-06-09 DIAGNOSIS — E785 Hyperlipidemia, unspecified: Secondary | ICD-10-CM | POA: Diagnosis not present

## 2019-06-09 DIAGNOSIS — R7303 Prediabetes: Secondary | ICD-10-CM | POA: Diagnosis not present

## 2019-06-09 DIAGNOSIS — M1990 Unspecified osteoarthritis, unspecified site: Secondary | ICD-10-CM | POA: Diagnosis not present

## 2019-06-09 DIAGNOSIS — Q2733 Arteriovenous malformation of digestive system vessel: Secondary | ICD-10-CM | POA: Diagnosis not present

## 2019-06-09 DIAGNOSIS — M353 Polymyalgia rheumatica: Secondary | ICD-10-CM | POA: Diagnosis not present

## 2019-06-09 DIAGNOSIS — D631 Anemia in chronic kidney disease: Secondary | ICD-10-CM | POA: Diagnosis not present

## 2019-06-09 DIAGNOSIS — I130 Hypertensive heart and chronic kidney disease with heart failure and stage 1 through stage 4 chronic kidney disease, or unspecified chronic kidney disease: Secondary | ICD-10-CM | POA: Diagnosis not present

## 2019-06-09 DIAGNOSIS — N183 Chronic kidney disease, stage 3 (moderate): Secondary | ICD-10-CM | POA: Diagnosis not present

## 2019-06-09 DIAGNOSIS — I700 Atherosclerosis of aorta: Secondary | ICD-10-CM | POA: Diagnosis not present

## 2019-06-09 DIAGNOSIS — M1A9XX0 Chronic gout, unspecified, without tophus (tophi): Secondary | ICD-10-CM | POA: Diagnosis not present

## 2019-06-09 DIAGNOSIS — J449 Chronic obstructive pulmonary disease, unspecified: Secondary | ICD-10-CM | POA: Diagnosis not present

## 2019-06-09 DIAGNOSIS — I422 Other hypertrophic cardiomyopathy: Secondary | ICD-10-CM | POA: Diagnosis not present

## 2019-06-09 DIAGNOSIS — I442 Atrioventricular block, complete: Secondary | ICD-10-CM | POA: Diagnosis not present

## 2019-06-09 DIAGNOSIS — I5032 Chronic diastolic (congestive) heart failure: Secondary | ICD-10-CM | POA: Diagnosis not present

## 2019-06-09 DIAGNOSIS — R1310 Dysphagia, unspecified: Secondary | ICD-10-CM | POA: Diagnosis not present

## 2019-06-09 DIAGNOSIS — J386 Stenosis of larynx: Secondary | ICD-10-CM | POA: Diagnosis not present

## 2019-06-09 DIAGNOSIS — K219 Gastro-esophageal reflux disease without esophagitis: Secondary | ICD-10-CM | POA: Diagnosis not present

## 2019-06-09 DIAGNOSIS — D508 Other iron deficiency anemias: Secondary | ICD-10-CM | POA: Diagnosis not present

## 2019-06-09 DIAGNOSIS — I252 Old myocardial infarction: Secondary | ICD-10-CM | POA: Diagnosis not present

## 2019-06-09 DIAGNOSIS — G4733 Obstructive sleep apnea (adult) (pediatric): Secondary | ICD-10-CM | POA: Diagnosis not present

## 2019-06-09 DIAGNOSIS — I69320 Aphasia following cerebral infarction: Secondary | ICD-10-CM | POA: Diagnosis not present

## 2019-06-09 NOTE — Telephone Encounter (Signed)
Mailed instructions to the patient today  CAPSOCAM CAPSULE ENDOSCOPY PATIENT INSTRUCTION SHEET  [name] September 14, 1946 [id number]   1. 7/3 Seven (7) days prior to capsule endoscopy stop taking iron supplements and carafate.  2. 7/8 Two (2) days prior to capsule endoscopy stop taking aspirin or any arthritis drugs.  3. 7/9 Day before capsule endoscopy purchase a 238 gram bottle of Miralax from the laxative section of your drug store, and a 32 oz. bottle of Gatorade (no red).    4. 7/9 One (1) day prior to capsule endoscopy: a) Stop smoking. b) Eat a regular diet until 12:00 Noon. c) After 12:00 Noon take only the following: Black coffee  Jell-O (no fruit or red Jell-o) Water   Bouillon (chicken or beef) 7-Up   Cranberry Juice Tea   Kool-Aid Popsicle (not red) Sprite   Coke Ginger Ale  Pepsi Mountain Dew Gatorade d) At 6:00 pm the evening before your appointment, drink 7 capfuls (105 grams) of Miralax with 32 oz. Gatorade. Drink 8 oz every 15 minutes until gone. e) Nothing to eat or drink after midnight except medications with a sip of water.  5. 7/10 Day of capsule endoscopy:            Do not have anything to eat or drink after midnight No medications for 2 hours prior to your test.  6. Please arrive at [facility]  3rd floor patient registration area by 8:30am on: 06/24/2019   For any questions: Call [facility] at [phone] and ask to speak with one of the capsule endoscopy nurses.      The above instructions have been reviewed and explained to me by________________   Patient signature:_________________________________________     Date:________________        What you should know.   You have been scheduled for a Capsule Endoscopy with Capsocam.  You will swallow a vitamin size pill that contains cameras that will take pictures of your small intestine (bowel).  Your small bowel connects to your stomach on one end and the large intestine or  bowel on the other. The capsule moves through the gastrointestinal tract taking pictures along the way. The images are stored on the capsule.  1-5 days after the capsule ingestion you will retrieve the capsule and mail it in a prepaid envelope to [employer] to download the images.  Your physician will be provided a copy of the images to review.   Who should have a capsule endoscopy?  You may need a small bowel capsule endoscopy if you have symptoms, such as blood in your stool, chronic pain, diarrhea, or unexplained anemia. The small intestine is a hollow organ that cannot be easily visualized with a scope due to its length.  The capsule endoscopy allows your physician to directly visualize the intestine.  The pictures may show intestine growths, inflammation, or bleeding in the small intestine.   What are the risks with a capsule endoscopy?  The pictures may not be clear or give [place] a clear cause of your symptoms The capsule may become trapped in the esophagus or intestines.  You may need surgery or endoscopic procedures to remove the capsule. You may not have a MRI until the capsule endoscopy has been retrieved.   How do I retrieve the capsule?  You will be provided a kit with step-by-step instructions for capsule retrieval and mailing.  You should expect to retrieve the capsule in 1-5 days after ingestion.  This will depend on your individual bowel  habits.

## 2019-06-16 ENCOUNTER — Telehealth: Payer: Self-pay | Admitting: Physician Assistant

## 2019-06-16 NOTE — Telephone Encounter (Signed)
Spoke with Linus Orn who reports patient had been taking prednisone 8mg  before last hospitalization by Dr. Annamaria Boots and was then Rx'ed 7.5mg  at discharge. Prednisone was removed from med list during 6/22 telemedicine visit with notes "patient note taking - as of 05/19/2019. Explained that PA did not discontinue medication. Patient will resume prednisone as she had been taking (added to med list). Routed to PA as FYI in case there are any concerns about her taking this with other meds/condition.

## 2019-06-16 NOTE — Telephone Encounter (Signed)
[name] from [facility] called to discuss the AVS from the patient's appt with [name] 06/06/19.  Dr. Annamaria Boots put the patient on 7.5mg  of prednisone daily for RA management. [name] put in the AVS to stop taking the medication, but stopping the medication was not discussed. The patient has stopped taking the prednisone, but since stopping has started to feel achy and uncomfortable in her shoulder and upper arms.   [name] would just like to know the reasoning as to why [name] stopped the prednisone, and possibly discuss putting the patient back on the medication.

## 2019-06-16 NOTE — Telephone Encounter (Signed)
Please let them know that I didn't stop her prednisone. I wonder if this was erroneously recorded by the [facility]. We didn't discuss her steroids.

## 2019-06-16 NOTE — Telephone Encounter (Signed)
error 

## 2019-06-21 DIAGNOSIS — I69320 Aphasia following cerebral infarction: Secondary | ICD-10-CM | POA: Diagnosis not present

## 2019-06-21 DIAGNOSIS — N183 Chronic kidney disease, stage 3 (moderate): Secondary | ICD-10-CM | POA: Diagnosis not present

## 2019-06-21 DIAGNOSIS — M1A9XX0 Chronic gout, unspecified, without tophus (tophi): Secondary | ICD-10-CM | POA: Diagnosis not present

## 2019-06-21 DIAGNOSIS — R7303 Prediabetes: Secondary | ICD-10-CM | POA: Diagnosis not present

## 2019-06-21 DIAGNOSIS — I4821 Permanent atrial fibrillation: Secondary | ICD-10-CM | POA: Diagnosis not present

## 2019-06-21 DIAGNOSIS — D508 Other iron deficiency anemias: Secondary | ICD-10-CM | POA: Diagnosis not present

## 2019-06-21 DIAGNOSIS — I5032 Chronic diastolic (congestive) heart failure: Secondary | ICD-10-CM | POA: Diagnosis not present

## 2019-06-21 DIAGNOSIS — J386 Stenosis of larynx: Secondary | ICD-10-CM | POA: Diagnosis not present

## 2019-06-21 DIAGNOSIS — Q2733 Arteriovenous malformation of digestive system vessel: Secondary | ICD-10-CM | POA: Diagnosis not present

## 2019-06-21 DIAGNOSIS — I442 Atrioventricular block, complete: Secondary | ICD-10-CM | POA: Diagnosis not present

## 2019-06-21 DIAGNOSIS — G4733 Obstructive sleep apnea (adult) (pediatric): Secondary | ICD-10-CM | POA: Diagnosis not present

## 2019-06-21 DIAGNOSIS — I252 Old myocardial infarction: Secondary | ICD-10-CM | POA: Diagnosis not present

## 2019-06-21 DIAGNOSIS — D631 Anemia in chronic kidney disease: Secondary | ICD-10-CM | POA: Diagnosis not present

## 2019-06-21 DIAGNOSIS — I700 Atherosclerosis of aorta: Secondary | ICD-10-CM | POA: Diagnosis not present

## 2019-06-21 DIAGNOSIS — I209 Angina pectoris, unspecified: Secondary | ICD-10-CM | POA: Diagnosis not present

## 2019-06-21 DIAGNOSIS — E785 Hyperlipidemia, unspecified: Secondary | ICD-10-CM | POA: Diagnosis not present

## 2019-06-21 DIAGNOSIS — J449 Chronic obstructive pulmonary disease, unspecified: Secondary | ICD-10-CM | POA: Diagnosis not present

## 2019-06-21 DIAGNOSIS — I422 Other hypertrophic cardiomyopathy: Secondary | ICD-10-CM | POA: Diagnosis not present

## 2019-06-21 DIAGNOSIS — K219 Gastro-esophageal reflux disease without esophagitis: Secondary | ICD-10-CM | POA: Diagnosis not present

## 2019-06-21 DIAGNOSIS — M1990 Unspecified osteoarthritis, unspecified site: Secondary | ICD-10-CM | POA: Diagnosis not present

## 2019-06-21 DIAGNOSIS — I130 Hypertensive heart and chronic kidney disease with heart failure and stage 1 through stage 4 chronic kidney disease, or unspecified chronic kidney disease: Secondary | ICD-10-CM | POA: Diagnosis not present

## 2019-06-21 DIAGNOSIS — M353 Polymyalgia rheumatica: Secondary | ICD-10-CM | POA: Diagnosis not present

## 2019-06-21 DIAGNOSIS — R1310 Dysphagia, unspecified: Secondary | ICD-10-CM | POA: Diagnosis not present

## 2019-06-22 ENCOUNTER — Telehealth: Payer: Self-pay

## 2019-06-22 NOTE — Telephone Encounter (Signed)
Spoke with pt and she will come to the office tomorrow to see if she thinks she will be able to swallow the capsule. Pt to ask for [facility] when she gets to the office.

## 2019-06-22 NOTE — Telephone Encounter (Signed)
Called pt and reminded her to bring applesauce with her to capsule endo appt on Friday to help her swallow the capsule. Pt wanted to know if the capsule was big, explained it is a little larger than a potassium pill. Pt states she usually does ok with the applesauce and she breaks the pill into several pieces. Explained to pt that she cannot break this capsule up. Pt concerned she will not be able to swallow the capsule. Please advise as pt would be charged for the capsule if she tried to swallow the capsule and is not able to get it down.

## 2019-06-22 NOTE — Telephone Encounter (Signed)
Discussed with Dr. Silverio Decamp.  We will have patient come in and look at the capsule prior to opening the capsule package.  If she does not feel she can swallow it we will cancel and she will no be charged.

## 2019-06-23 ENCOUNTER — Other Ambulatory Visit: Payer: Self-pay

## 2019-06-23 DIAGNOSIS — D500 Iron deficiency anemia secondary to blood loss (chronic): Secondary | ICD-10-CM

## 2019-06-23 NOTE — Telephone Encounter (Signed)
Patient came in the office and was shown a sample of the capsule that she would have to swallow for the VCE. Pt stated that she will not be able to swallow the capsule. Appt cancelled. Pt wants to know what the plan is now since she cannot proceed with VCE. Please advise.

## 2019-06-23 NOTE — Telephone Encounter (Signed)
Given she cannot swallow the capsule, the next best option would be either a small bowel follow-through or a CT enterography but I am not sure if patient will be able to swallow the contrast.  CT enterography involves less contrast as compared to his small bowel follow-through study.  Please send request to radiology for CT enterography.  Thank you

## 2019-06-23 NOTE — Telephone Encounter (Signed)
Pt scheduled for CT entero of a/p at [facility] 06/30/19. Pt to arrive there at 9:15am to drink contrast there at [facility]. Pt to be NPO after midnight. Appt info and instructions given to pts son and letter mailed.

## 2019-06-24 ENCOUNTER — Telehealth: Payer: Self-pay | Admitting: Gastroenterology

## 2019-06-24 DIAGNOSIS — D508 Other iron deficiency anemias: Secondary | ICD-10-CM | POA: Diagnosis not present

## 2019-06-24 DIAGNOSIS — I422 Other hypertrophic cardiomyopathy: Secondary | ICD-10-CM | POA: Diagnosis not present

## 2019-06-24 DIAGNOSIS — Q2733 Arteriovenous malformation of digestive system vessel: Secondary | ICD-10-CM | POA: Diagnosis not present

## 2019-06-24 DIAGNOSIS — M1990 Unspecified osteoarthritis, unspecified site: Secondary | ICD-10-CM | POA: Diagnosis not present

## 2019-06-24 DIAGNOSIS — I252 Old myocardial infarction: Secondary | ICD-10-CM | POA: Diagnosis not present

## 2019-06-24 DIAGNOSIS — D631 Anemia in chronic kidney disease: Secondary | ICD-10-CM | POA: Diagnosis not present

## 2019-06-24 DIAGNOSIS — I5032 Chronic diastolic (congestive) heart failure: Secondary | ICD-10-CM | POA: Diagnosis not present

## 2019-06-24 DIAGNOSIS — G4733 Obstructive sleep apnea (adult) (pediatric): Secondary | ICD-10-CM | POA: Diagnosis not present

## 2019-06-24 DIAGNOSIS — E785 Hyperlipidemia, unspecified: Secondary | ICD-10-CM | POA: Diagnosis not present

## 2019-06-24 DIAGNOSIS — I442 Atrioventricular block, complete: Secondary | ICD-10-CM | POA: Diagnosis not present

## 2019-06-24 DIAGNOSIS — R1310 Dysphagia, unspecified: Secondary | ICD-10-CM | POA: Diagnosis not present

## 2019-06-24 DIAGNOSIS — M1A9XX0 Chronic gout, unspecified, without tophus (tophi): Secondary | ICD-10-CM | POA: Diagnosis not present

## 2019-06-24 DIAGNOSIS — R7303 Prediabetes: Secondary | ICD-10-CM | POA: Diagnosis not present

## 2019-06-24 DIAGNOSIS — I209 Angina pectoris, unspecified: Secondary | ICD-10-CM | POA: Diagnosis not present

## 2019-06-24 DIAGNOSIS — J449 Chronic obstructive pulmonary disease, unspecified: Secondary | ICD-10-CM | POA: Diagnosis not present

## 2019-06-24 DIAGNOSIS — N183 Chronic kidney disease, stage 3 (moderate): Secondary | ICD-10-CM | POA: Diagnosis not present

## 2019-06-24 DIAGNOSIS — I130 Hypertensive heart and chronic kidney disease with heart failure and stage 1 through stage 4 chronic kidney disease, or unspecified chronic kidney disease: Secondary | ICD-10-CM | POA: Diagnosis not present

## 2019-06-24 DIAGNOSIS — I4821 Permanent atrial fibrillation: Secondary | ICD-10-CM | POA: Diagnosis not present

## 2019-06-24 DIAGNOSIS — I700 Atherosclerosis of aorta: Secondary | ICD-10-CM | POA: Diagnosis not present

## 2019-06-24 DIAGNOSIS — J386 Stenosis of larynx: Secondary | ICD-10-CM | POA: Diagnosis not present

## 2019-06-24 DIAGNOSIS — I69320 Aphasia following cerebral infarction: Secondary | ICD-10-CM | POA: Diagnosis not present

## 2019-06-24 DIAGNOSIS — K219 Gastro-esophageal reflux disease without esophagitis: Secondary | ICD-10-CM | POA: Diagnosis not present

## 2019-06-24 DIAGNOSIS — M353 Polymyalgia rheumatica: Secondary | ICD-10-CM | POA: Diagnosis not present

## 2019-06-24 NOTE — Telephone Encounter (Signed)
Care connections Almyra Free [phone]  Patient was advise to hold her meds for the capsule but was not completed. She is asking if she needs to still hold her meds for the CT that she will be having 7-16.

## 2019-06-24 NOTE — Telephone Encounter (Signed)
Left a message for the nurse Almyra Free. Patient does not have to hold any medications. She must fast for 4 hours prior to the appointment. Call back if she needs further clarification.

## 2019-06-29 ENCOUNTER — Telehealth: Payer: Self-pay

## 2019-06-29 DIAGNOSIS — N183 Chronic kidney disease, stage 3 (moderate): Secondary | ICD-10-CM | POA: Diagnosis not present

## 2019-06-29 DIAGNOSIS — J386 Stenosis of larynx: Secondary | ICD-10-CM | POA: Diagnosis not present

## 2019-06-29 DIAGNOSIS — I252 Old myocardial infarction: Secondary | ICD-10-CM | POA: Diagnosis not present

## 2019-06-29 DIAGNOSIS — J449 Chronic obstructive pulmonary disease, unspecified: Secondary | ICD-10-CM | POA: Diagnosis not present

## 2019-06-29 DIAGNOSIS — I209 Angina pectoris, unspecified: Secondary | ICD-10-CM | POA: Diagnosis not present

## 2019-06-29 DIAGNOSIS — Q2733 Arteriovenous malformation of digestive system vessel: Secondary | ICD-10-CM | POA: Diagnosis not present

## 2019-06-29 DIAGNOSIS — I130 Hypertensive heart and chronic kidney disease with heart failure and stage 1 through stage 4 chronic kidney disease, or unspecified chronic kidney disease: Secondary | ICD-10-CM | POA: Diagnosis not present

## 2019-06-29 DIAGNOSIS — I69320 Aphasia following cerebral infarction: Secondary | ICD-10-CM | POA: Diagnosis not present

## 2019-06-29 DIAGNOSIS — R7303 Prediabetes: Secondary | ICD-10-CM | POA: Diagnosis not present

## 2019-06-29 DIAGNOSIS — R1310 Dysphagia, unspecified: Secondary | ICD-10-CM | POA: Diagnosis not present

## 2019-06-29 DIAGNOSIS — I5032 Chronic diastolic (congestive) heart failure: Secondary | ICD-10-CM | POA: Diagnosis not present

## 2019-06-29 DIAGNOSIS — I442 Atrioventricular block, complete: Secondary | ICD-10-CM | POA: Diagnosis not present

## 2019-06-29 DIAGNOSIS — I700 Atherosclerosis of aorta: Secondary | ICD-10-CM | POA: Diagnosis not present

## 2019-06-29 DIAGNOSIS — M1A9XX0 Chronic gout, unspecified, without tophus (tophi): Secondary | ICD-10-CM | POA: Diagnosis not present

## 2019-06-29 DIAGNOSIS — D631 Anemia in chronic kidney disease: Secondary | ICD-10-CM | POA: Diagnosis not present

## 2019-06-29 DIAGNOSIS — I4821 Permanent atrial fibrillation: Secondary | ICD-10-CM | POA: Diagnosis not present

## 2019-06-29 DIAGNOSIS — K219 Gastro-esophageal reflux disease without esophagitis: Secondary | ICD-10-CM | POA: Diagnosis not present

## 2019-06-29 DIAGNOSIS — I422 Other hypertrophic cardiomyopathy: Secondary | ICD-10-CM | POA: Diagnosis not present

## 2019-06-29 DIAGNOSIS — M353 Polymyalgia rheumatica: Secondary | ICD-10-CM | POA: Diagnosis not present

## 2019-06-29 DIAGNOSIS — E785 Hyperlipidemia, unspecified: Secondary | ICD-10-CM | POA: Diagnosis not present

## 2019-06-29 DIAGNOSIS — M1990 Unspecified osteoarthritis, unspecified site: Secondary | ICD-10-CM | POA: Diagnosis not present

## 2019-06-29 DIAGNOSIS — D508 Other iron deficiency anemias: Secondary | ICD-10-CM | POA: Diagnosis not present

## 2019-06-29 DIAGNOSIS — G4733 Obstructive sleep apnea (adult) (pediatric): Secondary | ICD-10-CM | POA: Diagnosis not present

## 2019-06-29 NOTE — Telephone Encounter (Signed)
Ok please cancel CT. Schedule next available virtual visit to discuss further work up and management. Thanks

## 2019-06-29 NOTE — Telephone Encounter (Signed)
Ebony Hail, SLP with [facility], LVM on nurse line requesting VO for home health.   2x a week for 1 week 1x a week for 1 week  Ebony Hail [phone]

## 2019-06-29 NOTE — Telephone Encounter (Signed)
Pt is scheduled for CT enterography tomorrow at hospital. Received call from radiology that pts creatinine is to high to give her the IV contrast. They want to know if Dr. Silverio Decamp wants to order a different test. Please advise.

## 2019-06-29 NOTE — Telephone Encounter (Signed)
Called in verbal orders as requested below. Thank you

## 2019-06-29 NOTE — Telephone Encounter (Signed)
CT cancelled. Left message for the patient to call me back. Appointment scheduled in August.

## 2019-06-30 ENCOUNTER — Other Ambulatory Visit: Payer: Self-pay

## 2019-06-30 ENCOUNTER — Telehealth: Payer: Self-pay | Admitting: Gastroenterology

## 2019-06-30 ENCOUNTER — Ambulatory Visit (HOSPITAL_COMMUNITY): Admission: RE | Admit: 2019-06-30 | Payer: Medicare Other | Source: Ambulatory Visit

## 2019-06-30 ENCOUNTER — Emergency Department (HOSPITAL_COMMUNITY)
Admission: EM | Admit: 2019-06-30 | Discharge: 2019-06-30 | Discharge status: Absent without leave | Payer: Medicare Other

## 2019-06-30 NOTE — Telephone Encounter (Signed)
Spoke with the son face to face in the office. He is trying to help his mother coordinate her care. Takes her to her appointments. Patient seems a little confused through out the conversation. Unsure of her upcoming appointments and of her doctors. Explained the reason for canceling the CT.   The patient is scheduled to see her nephrologist 07/15/19. Mr Wubben aware. She has an order for a repeat CBC with her cardiologist.  He will assist his mother to get these things taken care of. Thanks me for my help with this.

## 2019-06-30 NOTE — Telephone Encounter (Signed)
Patient son called and was not aware that the ct was canceled today for the patient. He has some questions that he would like to discuss with the nurse regarding the next step. Please call [phone]

## 2019-07-01 DIAGNOSIS — J386 Stenosis of larynx: Secondary | ICD-10-CM | POA: Diagnosis not present

## 2019-07-01 DIAGNOSIS — R040 Epistaxis: Secondary | ICD-10-CM | POA: Diagnosis not present

## 2019-07-01 DIAGNOSIS — Z430 Encounter for attention to tracheostomy: Secondary | ICD-10-CM | POA: Diagnosis not present

## 2019-07-04 ENCOUNTER — Other Ambulatory Visit: Payer: Self-pay | Admitting: *Deleted

## 2019-07-04 DIAGNOSIS — I10 Essential (primary) hypertension: Secondary | ICD-10-CM

## 2019-07-04 NOTE — Patient Outreach (Signed)
Member assessed for potential [facility] Care Management needs. Per Patient [name], [name] has has had frequent ED visits. Most recent ED visit was on 06/30/19.  Telephone call made to member at [phone]. [name] answered the phone. However, she handed the phone to her son, [name]. Patient identifiers confirmed.  Explained [place] Management services to [place]. Explained that it appears, due to frequent ED visits, that [name] could benefit from [facility] Management follow up.   [name] is agreeable but quickly got off the phone. States " we just got in from the grocery store and is hot. My brain is fried." Writer explained that [place] will follow up at another time. [name] is agreeable. [name] confirms that he and his mother live together.   Member has medical history of HTN, COPD, CHF, trach, HLD.  Will place [facility] referral.    Marthenia Rolling, MSN-Ed, RN,BSN [place] Acute Care Coordinator [phone] [facility]) [phone]  (Toll free office)

## 2019-07-05 ENCOUNTER — Other Ambulatory Visit: Payer: Self-pay | Admitting: *Deleted

## 2019-07-05 DIAGNOSIS — I209 Angina pectoris, unspecified: Secondary | ICD-10-CM | POA: Diagnosis not present

## 2019-07-05 DIAGNOSIS — G4733 Obstructive sleep apnea (adult) (pediatric): Secondary | ICD-10-CM | POA: Diagnosis not present

## 2019-07-05 DIAGNOSIS — D631 Anemia in chronic kidney disease: Secondary | ICD-10-CM | POA: Diagnosis not present

## 2019-07-05 DIAGNOSIS — E785 Hyperlipidemia, unspecified: Secondary | ICD-10-CM | POA: Diagnosis not present

## 2019-07-05 DIAGNOSIS — R7303 Prediabetes: Secondary | ICD-10-CM | POA: Diagnosis not present

## 2019-07-05 DIAGNOSIS — R1310 Dysphagia, unspecified: Secondary | ICD-10-CM | POA: Diagnosis not present

## 2019-07-05 DIAGNOSIS — I69320 Aphasia following cerebral infarction: Secondary | ICD-10-CM | POA: Diagnosis not present

## 2019-07-05 DIAGNOSIS — M1A9XX0 Chronic gout, unspecified, without tophus (tophi): Secondary | ICD-10-CM | POA: Diagnosis not present

## 2019-07-05 DIAGNOSIS — K219 Gastro-esophageal reflux disease without esophagitis: Secondary | ICD-10-CM | POA: Diagnosis not present

## 2019-07-05 DIAGNOSIS — I252 Old myocardial infarction: Secondary | ICD-10-CM | POA: Diagnosis not present

## 2019-07-05 DIAGNOSIS — D508 Other iron deficiency anemias: Secondary | ICD-10-CM | POA: Diagnosis not present

## 2019-07-05 DIAGNOSIS — M1990 Unspecified osteoarthritis, unspecified site: Secondary | ICD-10-CM | POA: Diagnosis not present

## 2019-07-05 DIAGNOSIS — I442 Atrioventricular block, complete: Secondary | ICD-10-CM | POA: Diagnosis not present

## 2019-07-05 DIAGNOSIS — I4821 Permanent atrial fibrillation: Secondary | ICD-10-CM | POA: Diagnosis not present

## 2019-07-05 DIAGNOSIS — J449 Chronic obstructive pulmonary disease, unspecified: Secondary | ICD-10-CM | POA: Diagnosis not present

## 2019-07-05 DIAGNOSIS — M353 Polymyalgia rheumatica: Secondary | ICD-10-CM | POA: Diagnosis not present

## 2019-07-05 DIAGNOSIS — J386 Stenosis of larynx: Secondary | ICD-10-CM | POA: Diagnosis not present

## 2019-07-05 DIAGNOSIS — I422 Other hypertrophic cardiomyopathy: Secondary | ICD-10-CM | POA: Diagnosis not present

## 2019-07-05 DIAGNOSIS — N183 Chronic kidney disease, stage 3 (moderate): Secondary | ICD-10-CM | POA: Diagnosis not present

## 2019-07-05 DIAGNOSIS — Q2733 Arteriovenous malformation of digestive system vessel: Secondary | ICD-10-CM | POA: Diagnosis not present

## 2019-07-05 DIAGNOSIS — I5032 Chronic diastolic (congestive) heart failure: Secondary | ICD-10-CM | POA: Diagnosis not present

## 2019-07-05 DIAGNOSIS — I130 Hypertensive heart and chronic kidney disease with heart failure and stage 1 through stage 4 chronic kidney disease, or unspecified chronic kidney disease: Secondary | ICD-10-CM | POA: Diagnosis not present

## 2019-07-05 DIAGNOSIS — I700 Atherosclerosis of aorta: Secondary | ICD-10-CM | POA: Diagnosis not present

## 2019-07-05 NOTE — Patient Outreach (Signed)
Made aware by [facility] that member is actually active with [facility]. Writer confirmed this with Cheri with [facility] as well.   Cheri reports member has been getting iron infusions and the like. States she is followed closely by GI.   No further [facility] Care Management needs were assessed since member is active with [facility] (outpatient palliatve care program administered by [facility]).   Writer to make note of this for future P[facility] high ulitization list notes. Also made Kelly social worker aware at [place].  Marthenia Rolling, MSN-Ed, RN,BSN [place] Acute Care Coordinator [phone] [facility]) [phone]  (Toll free office)

## 2019-07-05 NOTE — Patient Outreach (Signed)
[name]) Care Management  07/05/2019  [name] [birth year] [id number]   Referral received from post acute care coordinator as member has had multiple ED visits over the last 6 months.  Upon reviewing chart, noted that member was involved with [facility].  Call placed to [facility], spoke with [name], confirmed member has been active with palliative care program since September 2019.  Will not open case at this time.    [name], [place], MSN [place] [phone]

## 2019-07-06 ENCOUNTER — Other Ambulatory Visit: Payer: Self-pay

## 2019-07-07 ENCOUNTER — Telehealth: Payer: Self-pay | Admitting: *Deleted

## 2019-07-07 DIAGNOSIS — I130 Hypertensive heart and chronic kidney disease with heart failure and stage 1 through stage 4 chronic kidney disease, or unspecified chronic kidney disease: Secondary | ICD-10-CM | POA: Diagnosis not present

## 2019-07-07 DIAGNOSIS — I700 Atherosclerosis of aorta: Secondary | ICD-10-CM | POA: Diagnosis not present

## 2019-07-07 DIAGNOSIS — I69320 Aphasia following cerebral infarction: Secondary | ICD-10-CM | POA: Diagnosis not present

## 2019-07-07 DIAGNOSIS — I252 Old myocardial infarction: Secondary | ICD-10-CM | POA: Diagnosis not present

## 2019-07-07 DIAGNOSIS — J386 Stenosis of larynx: Secondary | ICD-10-CM | POA: Diagnosis not present

## 2019-07-07 DIAGNOSIS — R7303 Prediabetes: Secondary | ICD-10-CM | POA: Diagnosis not present

## 2019-07-07 DIAGNOSIS — J449 Chronic obstructive pulmonary disease, unspecified: Secondary | ICD-10-CM | POA: Diagnosis not present

## 2019-07-07 DIAGNOSIS — I442 Atrioventricular block, complete: Secondary | ICD-10-CM | POA: Diagnosis not present

## 2019-07-07 DIAGNOSIS — D508 Other iron deficiency anemias: Secondary | ICD-10-CM | POA: Diagnosis not present

## 2019-07-07 DIAGNOSIS — I422 Other hypertrophic cardiomyopathy: Secondary | ICD-10-CM | POA: Diagnosis not present

## 2019-07-07 DIAGNOSIS — E785 Hyperlipidemia, unspecified: Secondary | ICD-10-CM | POA: Diagnosis not present

## 2019-07-07 DIAGNOSIS — N183 Chronic kidney disease, stage 3 (moderate): Secondary | ICD-10-CM | POA: Diagnosis not present

## 2019-07-07 DIAGNOSIS — I209 Angina pectoris, unspecified: Secondary | ICD-10-CM | POA: Diagnosis not present

## 2019-07-07 DIAGNOSIS — K219 Gastro-esophageal reflux disease without esophagitis: Secondary | ICD-10-CM | POA: Diagnosis not present

## 2019-07-07 DIAGNOSIS — I4821 Permanent atrial fibrillation: Secondary | ICD-10-CM | POA: Diagnosis not present

## 2019-07-07 DIAGNOSIS — M1990 Unspecified osteoarthritis, unspecified site: Secondary | ICD-10-CM | POA: Diagnosis not present

## 2019-07-07 DIAGNOSIS — G4733 Obstructive sleep apnea (adult) (pediatric): Secondary | ICD-10-CM | POA: Diagnosis not present

## 2019-07-07 DIAGNOSIS — R1310 Dysphagia, unspecified: Secondary | ICD-10-CM | POA: Diagnosis not present

## 2019-07-07 DIAGNOSIS — M1A9XX0 Chronic gout, unspecified, without tophus (tophi): Secondary | ICD-10-CM | POA: Diagnosis not present

## 2019-07-07 DIAGNOSIS — I5032 Chronic diastolic (congestive) heart failure: Secondary | ICD-10-CM | POA: Diagnosis not present

## 2019-07-07 DIAGNOSIS — Q2733 Arteriovenous malformation of digestive system vessel: Secondary | ICD-10-CM | POA: Diagnosis not present

## 2019-07-07 DIAGNOSIS — D631 Anemia in chronic kidney disease: Secondary | ICD-10-CM | POA: Diagnosis not present

## 2019-07-07 DIAGNOSIS — M353 Polymyalgia rheumatica: Secondary | ICD-10-CM | POA: Diagnosis not present

## 2019-07-07 MED ORDER — ALPRAZOLAM 0.5 MG PO TABS: 0.5000 mg | ORAL_TABLET | Freq: Every evening | ORAL | 0 refills | Status: DC | PRN

## 2019-07-07 NOTE — Telephone Encounter (Signed)
Ebony Hail from [facility] calling for SP verbal orders as follows:  2 time(s) weekly for 3 week(s).  You can leave verbal orders on confidential voicemail.  Christen Bame, CMA

## 2019-07-11 NOTE — Telephone Encounter (Signed)
Completed.

## 2019-07-12 DIAGNOSIS — D631 Anemia in chronic kidney disease: Secondary | ICD-10-CM | POA: Diagnosis not present

## 2019-07-12 DIAGNOSIS — K219 Gastro-esophageal reflux disease without esophagitis: Secondary | ICD-10-CM | POA: Diagnosis not present

## 2019-07-12 DIAGNOSIS — R1310 Dysphagia, unspecified: Secondary | ICD-10-CM | POA: Diagnosis not present

## 2019-07-12 DIAGNOSIS — M353 Polymyalgia rheumatica: Secondary | ICD-10-CM | POA: Diagnosis not present

## 2019-07-12 DIAGNOSIS — I252 Old myocardial infarction: Secondary | ICD-10-CM | POA: Diagnosis not present

## 2019-07-12 DIAGNOSIS — J449 Chronic obstructive pulmonary disease, unspecified: Secondary | ICD-10-CM | POA: Diagnosis not present

## 2019-07-12 DIAGNOSIS — D508 Other iron deficiency anemias: Secondary | ICD-10-CM | POA: Diagnosis not present

## 2019-07-12 DIAGNOSIS — M1A9XX0 Chronic gout, unspecified, without tophus (tophi): Secondary | ICD-10-CM | POA: Diagnosis not present

## 2019-07-12 DIAGNOSIS — I5032 Chronic diastolic (congestive) heart failure: Secondary | ICD-10-CM | POA: Diagnosis not present

## 2019-07-12 DIAGNOSIS — E785 Hyperlipidemia, unspecified: Secondary | ICD-10-CM | POA: Diagnosis not present

## 2019-07-12 DIAGNOSIS — I442 Atrioventricular block, complete: Secondary | ICD-10-CM | POA: Diagnosis not present

## 2019-07-12 DIAGNOSIS — I700 Atherosclerosis of aorta: Secondary | ICD-10-CM | POA: Diagnosis not present

## 2019-07-12 DIAGNOSIS — I209 Angina pectoris, unspecified: Secondary | ICD-10-CM | POA: Diagnosis not present

## 2019-07-12 DIAGNOSIS — M1990 Unspecified osteoarthritis, unspecified site: Secondary | ICD-10-CM | POA: Diagnosis not present

## 2019-07-12 DIAGNOSIS — N183 Chronic kidney disease, stage 3 (moderate): Secondary | ICD-10-CM | POA: Diagnosis not present

## 2019-07-12 DIAGNOSIS — R7303 Prediabetes: Secondary | ICD-10-CM | POA: Diagnosis not present

## 2019-07-12 DIAGNOSIS — G4733 Obstructive sleep apnea (adult) (pediatric): Secondary | ICD-10-CM | POA: Diagnosis not present

## 2019-07-12 DIAGNOSIS — I422 Other hypertrophic cardiomyopathy: Secondary | ICD-10-CM | POA: Diagnosis not present

## 2019-07-12 DIAGNOSIS — Q2733 Arteriovenous malformation of digestive system vessel: Secondary | ICD-10-CM | POA: Diagnosis not present

## 2019-07-12 DIAGNOSIS — I130 Hypertensive heart and chronic kidney disease with heart failure and stage 1 through stage 4 chronic kidney disease, or unspecified chronic kidney disease: Secondary | ICD-10-CM | POA: Diagnosis not present

## 2019-07-12 DIAGNOSIS — I4821 Permanent atrial fibrillation: Secondary | ICD-10-CM | POA: Diagnosis not present

## 2019-07-12 DIAGNOSIS — I69320 Aphasia following cerebral infarction: Secondary | ICD-10-CM | POA: Diagnosis not present

## 2019-07-12 DIAGNOSIS — J386 Stenosis of larynx: Secondary | ICD-10-CM | POA: Diagnosis not present

## 2019-07-14 DIAGNOSIS — I700 Atherosclerosis of aorta: Secondary | ICD-10-CM | POA: Diagnosis not present

## 2019-07-14 DIAGNOSIS — K219 Gastro-esophageal reflux disease without esophagitis: Secondary | ICD-10-CM | POA: Diagnosis not present

## 2019-07-14 DIAGNOSIS — M1990 Unspecified osteoarthritis, unspecified site: Secondary | ICD-10-CM | POA: Diagnosis not present

## 2019-07-14 DIAGNOSIS — J449 Chronic obstructive pulmonary disease, unspecified: Secondary | ICD-10-CM | POA: Diagnosis not present

## 2019-07-14 DIAGNOSIS — I130 Hypertensive heart and chronic kidney disease with heart failure and stage 1 through stage 4 chronic kidney disease, or unspecified chronic kidney disease: Secondary | ICD-10-CM | POA: Diagnosis not present

## 2019-07-14 DIAGNOSIS — I422 Other hypertrophic cardiomyopathy: Secondary | ICD-10-CM | POA: Diagnosis not present

## 2019-07-14 DIAGNOSIS — I252 Old myocardial infarction: Secondary | ICD-10-CM | POA: Diagnosis not present

## 2019-07-14 DIAGNOSIS — I4821 Permanent atrial fibrillation: Secondary | ICD-10-CM | POA: Diagnosis not present

## 2019-07-14 DIAGNOSIS — I209 Angina pectoris, unspecified: Secondary | ICD-10-CM | POA: Diagnosis not present

## 2019-07-14 DIAGNOSIS — D631 Anemia in chronic kidney disease: Secondary | ICD-10-CM | POA: Diagnosis not present

## 2019-07-14 DIAGNOSIS — I69320 Aphasia following cerebral infarction: Secondary | ICD-10-CM | POA: Diagnosis not present

## 2019-07-14 DIAGNOSIS — I5032 Chronic diastolic (congestive) heart failure: Secondary | ICD-10-CM | POA: Diagnosis not present

## 2019-07-14 DIAGNOSIS — R1310 Dysphagia, unspecified: Secondary | ICD-10-CM | POA: Diagnosis not present

## 2019-07-14 DIAGNOSIS — E785 Hyperlipidemia, unspecified: Secondary | ICD-10-CM | POA: Diagnosis not present

## 2019-07-14 DIAGNOSIS — M1A9XX0 Chronic gout, unspecified, without tophus (tophi): Secondary | ICD-10-CM | POA: Diagnosis not present

## 2019-07-14 DIAGNOSIS — J386 Stenosis of larynx: Secondary | ICD-10-CM | POA: Diagnosis not present

## 2019-07-14 DIAGNOSIS — I442 Atrioventricular block, complete: Secondary | ICD-10-CM | POA: Diagnosis not present

## 2019-07-14 DIAGNOSIS — G4733 Obstructive sleep apnea (adult) (pediatric): Secondary | ICD-10-CM | POA: Diagnosis not present

## 2019-07-14 DIAGNOSIS — D508 Other iron deficiency anemias: Secondary | ICD-10-CM | POA: Diagnosis not present

## 2019-07-14 DIAGNOSIS — M353 Polymyalgia rheumatica: Secondary | ICD-10-CM | POA: Diagnosis not present

## 2019-07-14 DIAGNOSIS — Q2733 Arteriovenous malformation of digestive system vessel: Secondary | ICD-10-CM | POA: Diagnosis not present

## 2019-07-14 DIAGNOSIS — R7303 Prediabetes: Secondary | ICD-10-CM | POA: Diagnosis not present

## 2019-07-14 DIAGNOSIS — N183 Chronic kidney disease, stage 3 (moderate): Secondary | ICD-10-CM | POA: Diagnosis not present

## 2019-07-14 NOTE — Progress Notes (Signed)
NEUROLOGY FOLLOW UP OFFICE NOTE  [name] [id number]  HISTORY OF PRESENT ILLNESS: [name] is a 73 year old right-handed black woman with laryngeal stenosis, CHF, complete heart block and a fib status post PPM 1998, chronic atypical chest pain, hypertension, hyperlipidemia, Neri artery disease, depression, history of alcohol and tobacco abuse who follows up for stroke presenting as expressive aphasia.  She is accompanied by her son who supplements history.  UPDATE: Current medications:  Eliquis; Crestor 20mg ; Lasix  She was admitted to the hospital on 05/08/19 for shortness of breath and worsening aphasia.  CT of head showed subacute left parietal infarct.  She was restarted on Eliquis.  She has undergone speech therapy.  She has had some improvement but still has trouble getting words out.     HISTORY: On 03/11/19, she developed both word-finding difficulty as well as difficulty getting words out.  She was able to understand her daughter talking to her.  This was associated with headache, dizziness, shortness of breath and diffuse body pain.  She did not have visual disturbance or unilateral numbness and weakness.  By the next day, she continued to have speech difficulty but other symptoms resolved.  She presented to the ED for further evaluation.   CT head was personally reviewed and showed atrophy and small vessel ischemic changes but no acute ischemic or hemorrhagic stroke.  She has known atrial fibrillation with PPM who is followed by cardiology but not a candidate for anticoagulation due to prior GI bleed secondary to AVM of stomach and not a candidate for watchman.  She was evaluated by neurology who increased ASA from 81mg  to 325mg  daily.  Speech has improved but she still has trouble getting words out.  CTA of head and neck from 03/17/19 showed no emergent initracranial or extracranial large vessel occlusion or significant stenosis.  Last echocardiogram performed  on 05/24/18 showed EF 65-70% with mild concentric hypertrophy, grade 2 diastolic dysfunction.  Last pacemaker device check from 09/23/18 was normal with no episodes recorded.  Hgb A1c from 11/26/18 was 6.4.  Lipid panel from 05/24/18 showed chol 222, TG 235, HDL 69 and LDL 106.  She takes Crestor 20mg , Lasix and metoprolol.  PAST MEDICAL HISTORY: Past Medical History:  Diagnosis Date  . Anemia   . Angina   . Angioedema    2/2 ACE  . Arteriovenous malformation of stomach   . Arthritis   . Asthma   . AVM (arteriovenous malformation) of colon    small intestine; stomach  . Blood transfusion   . CHF (congestive heart failure) ([place])   . Complete heart block ([place])    s/p PPM 1998  . Complication of anesthesia    Difficult airway; anaphylaxis and swelling with propofol  . DDD (degenerative disc disease)   . Depression   . Diastolic heart failure   . Fatty liver 07/26/10  . GERD (gastroesophageal reflux disease)   . GI bleed   . Headache   . History of alcohol abuse Stopped Fall 2012  . History of tobacco use Quit Fall 2012  . Hx of cardiovascular stress test    a. Lexiscan Myoview (10/15):  Small inferolateral and apical defect c/w scar and poss soft tissue attenuation, no ischemia, EF 42%  . Hx of colonic polyp 08/13/10   adenomatous  . Hx of colonoscopy   . Hyperlipidemia   . Hyperlipidemia   . Hypertension   . Hypertrophic cardiomyopathy ([place])    dx by Dr [name] 2009  .  Iron deficiency anemia   . Myocardial infarction ([place])   . Panic attack   . Panic attacks   . Permanent atrial fibrillation   . Pneumonia   . Renal failure    baseline creatinine 1.6  . Right arm pain 01/08/2012  . RLS (restless legs syndrome)    Dx 06/2007  . Shortness of breath    sob on exertation  . Sleep apnea     MEDICATIONS: Current Outpatient Medications on File Prior to Visit  Medication Sig Dispense Refill  . acetaminophen (TYLENOL) 650 MG CR tablet Take 650-1,300 mg by mouth every 8  (eight) hours as needed for pain.    [name] albuterol (PROVENTIL HFA;VENTOLIN HFA) 108 (90 Base) MCG/ACT inhaler Inhale 1-2 puffs into the lungs every 6 (six) hours as needed for wheezing or shortness of breath. 1 Inhaler 6  . allopurinol (ZYLOPRIM) 100 MG tablet TAKE 2 TABLETS(200 MG) BY MOUTH DAILY 180 tablet 3  . ALPRAZolam (XANAX) 0.5 MG tablet Take 1 tablet (0.5 mg total) by mouth at bedtime as needed for anxiety. 30 tablet 0  . apixaban (ELIQUIS) 5 MG TABS tablet Take 1 tablet (5 mg total) by mouth 2 (two) times daily. 180 tablet 1  . bimatoprost (LUMIGAN) 0.01 % SOLN Place 1 drop into both eyes at bedtime.    . cetirizine (ZYRTEC) 10 MG tablet Take 10 mg by mouth daily.    . Cholecalciferol (VITAMIN D) 50 MCG (2000 UT) tablet Take 2,000 Units by mouth daily.    . ferrous sulfate 325 (65 FE) MG tablet TAKE 1 TABLET BY MOUTH TWICE DAILY WITH MEALS TO KEEP BLOOD COUNT UP (Patient taking differently: Take 325 mg by mouth 2 (two) times daily with a meal. ) 60 tablet 11  . furosemide (LASIX) 40 MG tablet TAKE 1 TABLET BY MOUTH TWICE DAILY 180 tablet 3  . ipratropium-albuterol (DUONEB) 0.5-2.5 (3) MG/3ML SOLN USE 3 ML VIA NEBULIZER EVERY 6 HOURS (Patient taking differently: Take 3 mLs by nebulization every 6 (six) hours as needed (shortness of breath). ) 360 mL 0  . magnesium hydroxide (MILK OF MAGNESIA) 400 MG/5ML suspension Take 60 mLs by mouth daily as needed for mild constipation.     . nitroGLYCERIN (NITROSTAT) 0.4 MG SL tablet Place 1 tablet (0.4 mg total) under the tongue every 5 (five) minutes as needed for chest pain. 25 tablet 1  . Olopatadine HCl 0.2 % SOLN Place 1-2 drops into both eyes daily as needed (itching).   1  . oxymetazoline (AFRIN NASAL SPRAY) 0.05 % nasal spray Place 1 spray into both nostrils 2 (two) times daily. 30 mL 0  . polyethylene glycol (MIRALAX / GLYCOLAX) packet Take 17 g by mouth daily as needed for mild constipation. 100 each 1  . potassium chloride (K-DUR) 10 MEQ tablet  Take 1 tablet (10 mEq total) by mouth daily. 90 tablet 0  . PREDNISONE PO Take 7.5 mg by mouth.    . rosuvastatin (CRESTOR) 20 MG tablet Take 1 tablet (20 mg total) by mouth daily. (Patient taking differently: Take 20 mg by mouth at bedtime. ) 90 tablet 3  . sodium chloride (OCEAN) 0.65 % SOLN nasal spray Place 1 spray into both nostrils daily as needed for congestion.     . triamcinolone cream (KENALOG) 0.1 % Apply 1 application topically 2 (two) times daily. (Patient taking differently: Apply 1 application topically 2 (two) times daily as needed (rash). ) 30 g 2   No current facility-administered medications on  file prior to visit.     ALLERGIES: Allergies  Allergen Reactions  . Ace Inhibitors Swelling    angiodema  . Other Anaphylaxis and Swelling    Reaction to unspecified anesthesia     FAMILY HISTORY: Family History  Problem Relation Age of Onset  . Hypertension Mother   . Stroke Mother   . Heart disease Mother   . Aneurysm Mother   . CVA Mother   . Hypertension Father   . Stroke Father   . Heart attack Father   . Alcohol abuse Father   . Hypertension Sister   . Hypertension Brother   . Colon cancer Brother   . Cancer Brother   . Diabetes Sister   . Diabetes Brother   . Heart attack Brother   . Heart attack Brother   . Heart disease Sister   . Alcohol abuse Brother   . Heart disease Daughter     SOCIAL HISTORY: Social History   Socioeconomic History  . Marital status: Divorced    Spouse name: Not on file  . Number of children: 4  . Years of education: 9  . Highest education level: Not on file  Occupational History  . Occupation: disabled    Fish farm manager: UNEMPLOYED  . Occupation: previously- Development worker, community  Social Needs  . Financial resource strain: Not on file  . Food insecurity    Worry: Not on file    Inability: Not on file  . Transportation needs    Medical: Not on file    Non-medical: Not on file  Tobacco Use  . Smoking status: Former Smoker     Packs/day: 0.10    Years: 50.00    Pack years: 5.00    Types: Cigarettes    Quit date: 08/16/2011    Years since quitting: 7.9  . Smokeless tobacco: Never Used  . Tobacco comment: quit 2012  Substance and Sexual Activity  . Alcohol use: No    Comment: quit 08/16/2011  . Drug use: No  . Sexual activity: Never  Lifestyle  . Physical activity    Days per week: Not on file    Minutes per session: Not on file  . Stress: Not on file  Relationships  . Social Herbalist on phone: Not on file    Gets together: Not on file    Attends religious service: Not on file    Active member of club or organization: Not on file    Attends meetings of clubs or organizations: Not on file    Relationship status: Not on file  . Intimate partner violence    Fear of current or ex partner: Not on file    Emotionally abused: Not on file    Physically abused: Not on file    Forced sexual activity: Not on file  Other Topics Concern  . Not on file  Social History Narrative   On disability for heart failure/pacemaker.     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    4 grown children,  8 grandchildren.     Drinks alcohol a few times a week socially. At one time, the most she reports drinking is about 3 mixed drinks.   Lives with daughter [name] and son. Will be moving 04/2011 to different house because concerned children are taking advantage of her financial situation.    No illicit drugs.   Smokes few cigarettes daily, especially when stressed. Started smoking @ age 61. Quit January 2012 2/2  health but started smoking again 02/2011.       Health Care POA:    Emergency Contact: daughter, Verdene Lennert [phone]   End of Life Plan: gave pt AD info pamphlet   Who lives with you: no one- lives in section 8 housing   Any pets: none   Diet: Pt has a varied diet of protein, starch and vegetables   Exercise: Pt has no regular exercise routine.   Seatbelts: Pt reports wearing seatbelt when in  vehicles.    Hobbies: dancing    REVIEW OF SYSTEMS: Constitutional: No fevers, chills, or sweats, no generalized fatigue, change in appetite Eyes: No visual changes, double vision, eye pain Ear, nose and throat: mucus build up at trach Cardiovascular: No chest pain, palpitations Respiratory:  No shortness of breath at rest or with exertion, wheezes GastrointestinaI: No nausea, vomiting, diarrhea, abdominal pain, fecal incontinence Genitourinary:  No dysuria, urinary retention or frequency Musculoskeletal:  No neck pain, back pain Integumentary: No rash, pruritus, skin lesions Neurological: as above Psychiatric: No depression, insomnia, anxiety Endocrine: No palpitations, fatigue, diaphoresis, mood swings, change in appetite, change in weight, increased thirst Hematologic/Lymphatic:  No purpura, petechiae. Allergic/Immunologic: no itchy/runny eyes, nasal congestion, recent allergic reactions, rashes  PHYSICAL EXAM: Blood pressure 114/73, pulse 69, temperature 99.1 F (37.3 C), height 5' 3.5" (1.613 m), weight 162 lb (73.5 kg), SpO2 100 %. General: No acute distress.  Patient appears well-groomed.   Head:  Normocephalic/atraumatic Eyes:  Fundi examined but not visualized Neck: supple, no paraspinal tenderness, full range of motion Heart:  Regular rate and rhythm Lungs:  Clear to auscultation bilaterally Back: No paraspinal tenderness Neurological Exam: alert and oriented to person, place, and time. Attention span and concentration fair, recent and remote memory intact, fund of knowledge intact.  Dysfluency of speech.  Able to follow commands. CN II-XII intact. Bulk and tone normal, muscle strength 5/5 throughout.  Sensation to light touch intact.  Deep tendon reflexes 1+ throughout.  Finger to nose testing intact.  Wide-based gait.   IMPRESSION: 1.  Aphasia as late effect of CVA/left parietal infarct 2.  Atrial fibrillation with complete heart block 3.  Hypertension 4.   Hyperlipidemia  PLAN: 1.  Eliquis for secondary stroke prevention 2.  Crestor (LDL goal less than 70) 3.  Blood pressure management 4.  Mediterranean diet 5.  Follow up in 6 months.  26 minutes spent face to face with patient, over 50% spent discussing management.   Metta Clines, DO

## 2019-07-15 ENCOUNTER — Encounter: Payer: Self-pay | Admitting: Neurology

## 2019-07-15 ENCOUNTER — Ambulatory Visit (INDEPENDENT_AMBULATORY_CARE_PROVIDER_SITE_OTHER): Payer: Medicare Other | Admitting: Neurology

## 2019-07-15 ENCOUNTER — Other Ambulatory Visit: Payer: Self-pay

## 2019-07-15 VITALS — BP 114/73 | HR 69 | Temp 99.1°F | Ht 63.5 in | Wt 162.0 lb

## 2019-07-15 DIAGNOSIS — E7800 Pure hypercholesterolemia, unspecified: Secondary | ICD-10-CM | POA: Diagnosis not present

## 2019-07-15 DIAGNOSIS — I4821 Permanent atrial fibrillation: Secondary | ICD-10-CM

## 2019-07-15 DIAGNOSIS — I69320 Aphasia following cerebral infarction: Secondary | ICD-10-CM

## 2019-07-15 DIAGNOSIS — I10 Essential (primary) hypertension: Secondary | ICD-10-CM | POA: Diagnosis not present

## 2019-07-15 NOTE — Patient Instructions (Signed)
1.  Continue Eliquis 2.  Continue Crestor 3.  Blood pressure control 4.  Follow up in 6 months

## 2019-07-19 DIAGNOSIS — J386 Stenosis of larynx: Secondary | ICD-10-CM | POA: Diagnosis not present

## 2019-07-19 DIAGNOSIS — R1310 Dysphagia, unspecified: Secondary | ICD-10-CM | POA: Diagnosis not present

## 2019-07-19 DIAGNOSIS — K219 Gastro-esophageal reflux disease without esophagitis: Secondary | ICD-10-CM | POA: Diagnosis not present

## 2019-07-19 DIAGNOSIS — J449 Chronic obstructive pulmonary disease, unspecified: Secondary | ICD-10-CM | POA: Diagnosis not present

## 2019-07-19 DIAGNOSIS — I252 Old myocardial infarction: Secondary | ICD-10-CM | POA: Diagnosis not present

## 2019-07-19 DIAGNOSIS — M1A9XX0 Chronic gout, unspecified, without tophus (tophi): Secondary | ICD-10-CM | POA: Diagnosis not present

## 2019-07-19 DIAGNOSIS — D508 Other iron deficiency anemias: Secondary | ICD-10-CM | POA: Diagnosis not present

## 2019-07-19 DIAGNOSIS — N183 Chronic kidney disease, stage 3 (moderate): Secondary | ICD-10-CM | POA: Diagnosis not present

## 2019-07-19 DIAGNOSIS — I69320 Aphasia following cerebral infarction: Secondary | ICD-10-CM | POA: Diagnosis not present

## 2019-07-19 DIAGNOSIS — D631 Anemia in chronic kidney disease: Secondary | ICD-10-CM | POA: Diagnosis not present

## 2019-07-19 DIAGNOSIS — I4821 Permanent atrial fibrillation: Secondary | ICD-10-CM | POA: Diagnosis not present

## 2019-07-19 DIAGNOSIS — I5032 Chronic diastolic (congestive) heart failure: Secondary | ICD-10-CM | POA: Diagnosis not present

## 2019-07-19 DIAGNOSIS — M353 Polymyalgia rheumatica: Secondary | ICD-10-CM | POA: Diagnosis not present

## 2019-07-19 DIAGNOSIS — I422 Other hypertrophic cardiomyopathy: Secondary | ICD-10-CM | POA: Diagnosis not present

## 2019-07-19 DIAGNOSIS — M1990 Unspecified osteoarthritis, unspecified site: Secondary | ICD-10-CM | POA: Diagnosis not present

## 2019-07-19 DIAGNOSIS — R7303 Prediabetes: Secondary | ICD-10-CM | POA: Diagnosis not present

## 2019-07-19 DIAGNOSIS — I130 Hypertensive heart and chronic kidney disease with heart failure and stage 1 through stage 4 chronic kidney disease, or unspecified chronic kidney disease: Secondary | ICD-10-CM | POA: Diagnosis not present

## 2019-07-19 DIAGNOSIS — I700 Atherosclerosis of aorta: Secondary | ICD-10-CM | POA: Diagnosis not present

## 2019-07-19 DIAGNOSIS — E785 Hyperlipidemia, unspecified: Secondary | ICD-10-CM | POA: Diagnosis not present

## 2019-07-19 DIAGNOSIS — I209 Angina pectoris, unspecified: Secondary | ICD-10-CM | POA: Diagnosis not present

## 2019-07-19 DIAGNOSIS — I442 Atrioventricular block, complete: Secondary | ICD-10-CM | POA: Diagnosis not present

## 2019-07-19 DIAGNOSIS — Q2733 Arteriovenous malformation of digestive system vessel: Secondary | ICD-10-CM | POA: Diagnosis not present

## 2019-07-19 DIAGNOSIS — G4733 Obstructive sleep apnea (adult) (pediatric): Secondary | ICD-10-CM | POA: Diagnosis not present

## 2019-07-21 DIAGNOSIS — I209 Angina pectoris, unspecified: Secondary | ICD-10-CM | POA: Diagnosis not present

## 2019-07-21 DIAGNOSIS — R7303 Prediabetes: Secondary | ICD-10-CM | POA: Diagnosis not present

## 2019-07-21 DIAGNOSIS — I5032 Chronic diastolic (congestive) heart failure: Secondary | ICD-10-CM | POA: Diagnosis not present

## 2019-07-21 DIAGNOSIS — J449 Chronic obstructive pulmonary disease, unspecified: Secondary | ICD-10-CM | POA: Diagnosis not present

## 2019-07-21 DIAGNOSIS — M353 Polymyalgia rheumatica: Secondary | ICD-10-CM | POA: Diagnosis not present

## 2019-07-21 DIAGNOSIS — I422 Other hypertrophic cardiomyopathy: Secondary | ICD-10-CM | POA: Diagnosis not present

## 2019-07-21 DIAGNOSIS — E785 Hyperlipidemia, unspecified: Secondary | ICD-10-CM | POA: Diagnosis not present

## 2019-07-21 DIAGNOSIS — D508 Other iron deficiency anemias: Secondary | ICD-10-CM | POA: Diagnosis not present

## 2019-07-21 DIAGNOSIS — N183 Chronic kidney disease, stage 3 (moderate): Secondary | ICD-10-CM | POA: Diagnosis not present

## 2019-07-21 DIAGNOSIS — I252 Old myocardial infarction: Secondary | ICD-10-CM | POA: Diagnosis not present

## 2019-07-21 DIAGNOSIS — I442 Atrioventricular block, complete: Secondary | ICD-10-CM | POA: Diagnosis not present

## 2019-07-21 DIAGNOSIS — K219 Gastro-esophageal reflux disease without esophagitis: Secondary | ICD-10-CM | POA: Diagnosis not present

## 2019-07-21 DIAGNOSIS — M1990 Unspecified osteoarthritis, unspecified site: Secondary | ICD-10-CM | POA: Diagnosis not present

## 2019-07-21 DIAGNOSIS — I69320 Aphasia following cerebral infarction: Secondary | ICD-10-CM | POA: Diagnosis not present

## 2019-07-21 DIAGNOSIS — R1310 Dysphagia, unspecified: Secondary | ICD-10-CM | POA: Diagnosis not present

## 2019-07-21 DIAGNOSIS — J386 Stenosis of larynx: Secondary | ICD-10-CM | POA: Diagnosis not present

## 2019-07-21 DIAGNOSIS — M1A9XX0 Chronic gout, unspecified, without tophus (tophi): Secondary | ICD-10-CM | POA: Diagnosis not present

## 2019-07-21 DIAGNOSIS — I130 Hypertensive heart and chronic kidney disease with heart failure and stage 1 through stage 4 chronic kidney disease, or unspecified chronic kidney disease: Secondary | ICD-10-CM | POA: Diagnosis not present

## 2019-07-21 DIAGNOSIS — G4733 Obstructive sleep apnea (adult) (pediatric): Secondary | ICD-10-CM | POA: Diagnosis not present

## 2019-07-21 DIAGNOSIS — I4821 Permanent atrial fibrillation: Secondary | ICD-10-CM | POA: Diagnosis not present

## 2019-07-21 DIAGNOSIS — Q2733 Arteriovenous malformation of digestive system vessel: Secondary | ICD-10-CM | POA: Diagnosis not present

## 2019-07-21 DIAGNOSIS — I700 Atherosclerosis of aorta: Secondary | ICD-10-CM | POA: Diagnosis not present

## 2019-07-21 DIAGNOSIS — D631 Anemia in chronic kidney disease: Secondary | ICD-10-CM | POA: Diagnosis not present

## 2019-07-26 ENCOUNTER — Other Ambulatory Visit: Payer: Self-pay | Admitting: *Deleted

## 2019-07-26 NOTE — Telephone Encounter (Signed)
Spoke with patient and she is only taking them medication once a day as needed and I made her aware that this request is too early and she should have enough to last until the 22nd of this month.  Patient voiced understanding and will call back at the end of next week for a fill.  Also made aware that if she is using more than this amount or feels like her anxiety is worse to please make an appointment so this can be discussed. [name],CMA

## 2019-07-26 NOTE — Telephone Encounter (Signed)
LM with son ok per DPR, asked him if patient had been using her xanax as prescribed or more frequently.  He was unsure and will have patient call me back.  She was in bed still.  Will wait to hear from patient.  Orlean Holtrop,CMA

## 2019-07-26 NOTE — Telephone Encounter (Signed)
It is too early for patient to have refill of her Xanax.  Please call to determine if patient may need to be seen in person or virtually for her increased use of this medication for her anxiety.

## 2019-07-27 DIAGNOSIS — I252 Old myocardial infarction: Secondary | ICD-10-CM | POA: Diagnosis not present

## 2019-07-27 DIAGNOSIS — N183 Chronic kidney disease, stage 3 (moderate): Secondary | ICD-10-CM | POA: Diagnosis not present

## 2019-07-27 DIAGNOSIS — J386 Stenosis of larynx: Secondary | ICD-10-CM | POA: Diagnosis not present

## 2019-07-27 DIAGNOSIS — M1A9XX0 Chronic gout, unspecified, without tophus (tophi): Secondary | ICD-10-CM | POA: Diagnosis not present

## 2019-07-27 DIAGNOSIS — I4821 Permanent atrial fibrillation: Secondary | ICD-10-CM | POA: Diagnosis not present

## 2019-07-27 DIAGNOSIS — I442 Atrioventricular block, complete: Secondary | ICD-10-CM | POA: Diagnosis not present

## 2019-07-27 DIAGNOSIS — R1310 Dysphagia, unspecified: Secondary | ICD-10-CM | POA: Diagnosis not present

## 2019-07-27 DIAGNOSIS — D508 Other iron deficiency anemias: Secondary | ICD-10-CM | POA: Diagnosis not present

## 2019-07-27 DIAGNOSIS — I5032 Chronic diastolic (congestive) heart failure: Secondary | ICD-10-CM | POA: Diagnosis not present

## 2019-07-27 DIAGNOSIS — R7303 Prediabetes: Secondary | ICD-10-CM | POA: Diagnosis not present

## 2019-07-27 DIAGNOSIS — D631 Anemia in chronic kidney disease: Secondary | ICD-10-CM | POA: Diagnosis not present

## 2019-07-27 DIAGNOSIS — E785 Hyperlipidemia, unspecified: Secondary | ICD-10-CM | POA: Diagnosis not present

## 2019-07-27 DIAGNOSIS — J449 Chronic obstructive pulmonary disease, unspecified: Secondary | ICD-10-CM | POA: Diagnosis not present

## 2019-07-27 DIAGNOSIS — K219 Gastro-esophageal reflux disease without esophagitis: Secondary | ICD-10-CM | POA: Diagnosis not present

## 2019-07-27 DIAGNOSIS — G4733 Obstructive sleep apnea (adult) (pediatric): Secondary | ICD-10-CM | POA: Diagnosis not present

## 2019-07-27 DIAGNOSIS — I130 Hypertensive heart and chronic kidney disease with heart failure and stage 1 through stage 4 chronic kidney disease, or unspecified chronic kidney disease: Secondary | ICD-10-CM | POA: Diagnosis not present

## 2019-07-27 DIAGNOSIS — I69320 Aphasia following cerebral infarction: Secondary | ICD-10-CM | POA: Diagnosis not present

## 2019-07-27 DIAGNOSIS — I422 Other hypertrophic cardiomyopathy: Secondary | ICD-10-CM | POA: Diagnosis not present

## 2019-07-27 DIAGNOSIS — Q2733 Arteriovenous malformation of digestive system vessel: Secondary | ICD-10-CM | POA: Diagnosis not present

## 2019-07-27 DIAGNOSIS — I209 Angina pectoris, unspecified: Secondary | ICD-10-CM | POA: Diagnosis not present

## 2019-07-27 DIAGNOSIS — M1990 Unspecified osteoarthritis, unspecified site: Secondary | ICD-10-CM | POA: Diagnosis not present

## 2019-07-27 DIAGNOSIS — M353 Polymyalgia rheumatica: Secondary | ICD-10-CM | POA: Diagnosis not present

## 2019-07-27 DIAGNOSIS — I700 Atherosclerosis of aorta: Secondary | ICD-10-CM | POA: Diagnosis not present

## 2019-07-28 ENCOUNTER — Other Ambulatory Visit: Payer: Self-pay

## 2019-07-28 ENCOUNTER — Ambulatory Visit (HOSPITAL_COMMUNITY)
Admission: RE | Admit: 2019-07-28 | Discharge: 2019-07-28 | Disposition: A | Discharge status: Home / Self Care | Payer: Medicare Other | Source: Ambulatory Visit | Attending: Nephrology | Admitting: Nephrology

## 2019-07-28 VITALS — BP 124/73 | HR 70 | Temp 96.4°F | Resp 20

## 2019-07-28 DIAGNOSIS — D631 Anemia in chronic kidney disease: Secondary | ICD-10-CM | POA: Insufficient documentation

## 2019-07-28 DIAGNOSIS — N184 Chronic kidney disease, stage 4 (severe): Secondary | ICD-10-CM | POA: Diagnosis not present

## 2019-07-28 DIAGNOSIS — N183 Chronic kidney disease, stage 3 unspecified: Secondary | ICD-10-CM

## 2019-07-28 LAB — CBC WITH DIFFERENTIAL/PLATELET
Abs Immature Granulocytes: 0.02 10*3/uL (ref 0.00–0.07)
Basophils Absolute: 0 10*3/uL (ref 0.0–0.1)
Basophils Relative: 0 %
Eosinophils Absolute: 0 10*3/uL (ref 0.0–0.5)
Eosinophils Relative: 0 %
HCT: 27.4 % — ABNORMAL LOW (ref 36.0–46.0)
Hemoglobin: 8.4 g/dL — ABNORMAL LOW (ref 12.0–15.0)
Immature Granulocytes: 0 %
Lymphocytes Relative: 17 %
Lymphs Abs: 1 10*3/uL (ref 0.7–4.0)
MCH: 29.1 pg (ref 26.0–34.0)
MCHC: 30.7 g/dL (ref 30.0–36.0)
MCV: 94.8 fL (ref 80.0–100.0)
Monocytes Absolute: 0.3 10*3/uL (ref 0.1–1.0)
Monocytes Relative: 5 %
Neutro Abs: 4.9 10*3/uL (ref 1.7–7.7)
Neutrophils Relative %: 78 %
Platelets: 304 10*3/uL (ref 150–400)
RBC: 2.89 MIL/uL — ABNORMAL LOW (ref 3.87–5.11)
RDW: 15 % (ref 11.5–15.5)
WBC: 6.3 10*3/uL (ref 4.0–10.5)
nRBC: 0 % (ref 0.0–0.2)

## 2019-07-28 LAB — IRON AND TIBC
Iron: 44 ug/dL (ref 28–170)
Saturation Ratios: 12 % (ref 10.4–31.8)
TIBC: 358 ug/dL (ref 250–450)
UIBC: 314 ug/dL

## 2019-07-28 LAB — RENAL FUNCTION PANEL
Albumin: 3.9 g/dL (ref 3.5–5.0)
Anion gap: 15 (ref 5–15)
BUN: 45 mg/dL — ABNORMAL HIGH (ref 8–23)
CO2: 27 mmol/L (ref 22–32)
Calcium: 9.8 mg/dL (ref 8.9–10.3)
Chloride: 96 mmol/L — ABNORMAL LOW (ref 98–111)
Creatinine, Ser: 2.06 mg/dL — ABNORMAL HIGH (ref 0.44–1.00)
GFR calc Af Amer: 27 mL/min — ABNORMAL LOW (ref >60)
GFR calc non Af Amer: 23 mL/min — ABNORMAL LOW (ref >60)
Glucose, Bld: 146 mg/dL — ABNORMAL HIGH (ref 70–99)
Phosphorus: 3.1 mg/dL (ref 2.5–4.6)
Potassium: 3.4 mmol/L — ABNORMAL LOW (ref 3.5–5.1)
Sodium: 138 mmol/L (ref 135–145)

## 2019-07-28 LAB — POCT HEMOGLOBIN-HEMACUE: Hemoglobin: 8.7 g/dL — ABNORMAL LOW (ref 12.0–15.0)

## 2019-07-28 LAB — FERRITIN: Ferritin: 369 ng/mL — ABNORMAL HIGH (ref 11–307)

## 2019-07-28 MED ORDER — EPOETIN ALFA-EPBX 10000 UNIT/ML IJ SOLN
10000.0000 [IU] | INTRAMUSCULAR | Status: DC
  Administered 2019-07-28: 10000 [IU] via SUBCUTANEOUS
  Filled 2019-07-28: qty 1

## 2019-07-29 ENCOUNTER — Telehealth: Payer: Self-pay

## 2019-07-29 LAB — PTH, INTACT AND CALCIUM
Calcium, Total (PTH): 9.9 mg/dL (ref 8.7–10.3)
PTH: 67 pg/mL — ABNORMAL HIGH (ref 15–65)

## 2019-07-29 LAB — VITAMIN D 25 HYDROXY (VIT D DEFICIENCY, FRACTURES): Vit D, 25-Hydroxy: 57 ng/mL (ref 30.0–100.0)

## 2019-07-29 NOTE — Telephone Encounter (Signed)
Called in orders.

## 2019-07-29 NOTE — Telephone Encounter (Signed)
Ebony Hail, [name], calls nurse line requesting verbal orders to continue speech therapy.  2x a week for the next 2 weeks  [phone] you may leave a VM

## 2019-08-01 DIAGNOSIS — N183 Chronic kidney disease, stage 3 (moderate): Secondary | ICD-10-CM | POA: Diagnosis not present

## 2019-08-01 DIAGNOSIS — I422 Other hypertrophic cardiomyopathy: Secondary | ICD-10-CM | POA: Diagnosis not present

## 2019-08-01 DIAGNOSIS — M353 Polymyalgia rheumatica: Secondary | ICD-10-CM | POA: Diagnosis not present

## 2019-08-01 DIAGNOSIS — E785 Hyperlipidemia, unspecified: Secondary | ICD-10-CM | POA: Diagnosis not present

## 2019-08-01 DIAGNOSIS — I252 Old myocardial infarction: Secondary | ICD-10-CM | POA: Diagnosis not present

## 2019-08-01 DIAGNOSIS — D631 Anemia in chronic kidney disease: Secondary | ICD-10-CM | POA: Diagnosis not present

## 2019-08-01 DIAGNOSIS — K219 Gastro-esophageal reflux disease without esophagitis: Secondary | ICD-10-CM | POA: Diagnosis not present

## 2019-08-01 DIAGNOSIS — I4821 Permanent atrial fibrillation: Secondary | ICD-10-CM | POA: Diagnosis not present

## 2019-08-01 DIAGNOSIS — I69320 Aphasia following cerebral infarction: Secondary | ICD-10-CM | POA: Diagnosis not present

## 2019-08-01 DIAGNOSIS — J386 Stenosis of larynx: Secondary | ICD-10-CM | POA: Diagnosis not present

## 2019-08-01 DIAGNOSIS — I5032 Chronic diastolic (congestive) heart failure: Secondary | ICD-10-CM | POA: Diagnosis not present

## 2019-08-01 DIAGNOSIS — I130 Hypertensive heart and chronic kidney disease with heart failure and stage 1 through stage 4 chronic kidney disease, or unspecified chronic kidney disease: Secondary | ICD-10-CM | POA: Diagnosis not present

## 2019-08-01 DIAGNOSIS — G4733 Obstructive sleep apnea (adult) (pediatric): Secondary | ICD-10-CM | POA: Diagnosis not present

## 2019-08-01 DIAGNOSIS — D508 Other iron deficiency anemias: Secondary | ICD-10-CM | POA: Diagnosis not present

## 2019-08-01 DIAGNOSIS — Q2733 Arteriovenous malformation of digestive system vessel: Secondary | ICD-10-CM | POA: Diagnosis not present

## 2019-08-01 DIAGNOSIS — R7303 Prediabetes: Secondary | ICD-10-CM | POA: Diagnosis not present

## 2019-08-01 DIAGNOSIS — R1310 Dysphagia, unspecified: Secondary | ICD-10-CM | POA: Diagnosis not present

## 2019-08-01 DIAGNOSIS — I700 Atherosclerosis of aorta: Secondary | ICD-10-CM | POA: Diagnosis not present

## 2019-08-01 DIAGNOSIS — M1A9XX0 Chronic gout, unspecified, without tophus (tophi): Secondary | ICD-10-CM | POA: Diagnosis not present

## 2019-08-01 DIAGNOSIS — I442 Atrioventricular block, complete: Secondary | ICD-10-CM | POA: Diagnosis not present

## 2019-08-01 DIAGNOSIS — M1990 Unspecified osteoarthritis, unspecified site: Secondary | ICD-10-CM | POA: Diagnosis not present

## 2019-08-01 DIAGNOSIS — I209 Angina pectoris, unspecified: Secondary | ICD-10-CM | POA: Diagnosis not present

## 2019-08-01 DIAGNOSIS — J449 Chronic obstructive pulmonary disease, unspecified: Secondary | ICD-10-CM | POA: Diagnosis not present

## 2019-08-02 ENCOUNTER — Other Ambulatory Visit: Payer: Self-pay

## 2019-08-02 ENCOUNTER — Telehealth (INDEPENDENT_AMBULATORY_CARE_PROVIDER_SITE_OTHER): Payer: Medicare Other | Admitting: Family Medicine

## 2019-08-02 DIAGNOSIS — R042 Hemoptysis: Secondary | ICD-10-CM | POA: Insufficient documentation

## 2019-08-02 NOTE — Progress Notes (Signed)
156lb BP- N/A T- NO FEVER  [id number] - [place], [place] [address]

## 2019-08-02 NOTE — Progress Notes (Signed)
[place] Telemedicine Visit  Patient consented to have virtual visit. Method of visit: Telephone  Encounter participants: Patient: [name] - located at home Provider: Daisy Floro - located at clinic Others (if applicable): Patient's son Kristopher Delk  Chief Complaint: blood-tinged sputum  HPI: About 1 week ago the patient had an episode of coughing that then became productive for bloody mucus. The patient kept coughing and the blood cleared after 15-20 minutes and turned to a yellow/white mucus. She did not take any medicine to reduce her cough, but says she is allowed to take a liquid medicine, thinks it's Mucinex. She denies fevers, shortness of breath, sneezing, runny eyes, night sweats, and weight loss.   I also spoke with the patient's son.  He stated she was unaware that his mother was having this problem, usually when it is a big problem she will talk to him about it.  The son was given strict return precautions for the patient to be seen in clinic or go to the emergency department, including fever, myalgias, worsening bloody cough, worsening shortness of breath, fatigue, and altered mentation.  The patient's son voiced understanding of these guidelines.  ROS: per HPI  Pertinent PMHx: CHF, CVA, on Eliquis for A.fib  Exam:  Respiratory: speaking in complete sentences, has aphagia  Assessment/Plan: Cough with hemoptysis Patient states she has had 2.3 minor episodes of cough producing blood-tinged sputum.  Patient has tracheostomy, otherwise has no systemic symptoms concerning for infection, no history of trauma. -Strict return precautions given (see note above).  Time spent during visit with patient: 25:32 minutes  Milus Banister, [place], PGY-2 08/02/2019 3:31 PM

## 2019-08-02 NOTE — Assessment & Plan Note (Signed)
Patient states she has had 2.3 minor episodes of cough producing blood-tinged sputum.  Patient has tracheostomy, otherwise has no systemic symptoms concerning for infection, no history of trauma. -Strict return precautions given (see note above).

## 2019-08-03 DIAGNOSIS — J386 Stenosis of larynx: Secondary | ICD-10-CM | POA: Diagnosis not present

## 2019-08-03 DIAGNOSIS — D631 Anemia in chronic kidney disease: Secondary | ICD-10-CM | POA: Diagnosis not present

## 2019-08-03 DIAGNOSIS — R1310 Dysphagia, unspecified: Secondary | ICD-10-CM | POA: Diagnosis not present

## 2019-08-03 DIAGNOSIS — M1990 Unspecified osteoarthritis, unspecified site: Secondary | ICD-10-CM | POA: Diagnosis not present

## 2019-08-03 DIAGNOSIS — I4821 Permanent atrial fibrillation: Secondary | ICD-10-CM | POA: Diagnosis not present

## 2019-08-03 DIAGNOSIS — N183 Chronic kidney disease, stage 3 (moderate): Secondary | ICD-10-CM | POA: Diagnosis not present

## 2019-08-03 DIAGNOSIS — H35033 Hypertensive retinopathy, bilateral: Secondary | ICD-10-CM | POA: Diagnosis not present

## 2019-08-03 DIAGNOSIS — I422 Other hypertrophic cardiomyopathy: Secondary | ICD-10-CM | POA: Diagnosis not present

## 2019-08-03 DIAGNOSIS — I209 Angina pectoris, unspecified: Secondary | ICD-10-CM | POA: Diagnosis not present

## 2019-08-03 DIAGNOSIS — I442 Atrioventricular block, complete: Secondary | ICD-10-CM | POA: Diagnosis not present

## 2019-08-03 DIAGNOSIS — D508 Other iron deficiency anemias: Secondary | ICD-10-CM | POA: Diagnosis not present

## 2019-08-03 DIAGNOSIS — H40053 Ocular hypertension, bilateral: Secondary | ICD-10-CM | POA: Diagnosis not present

## 2019-08-03 DIAGNOSIS — I700 Atherosclerosis of aorta: Secondary | ICD-10-CM | POA: Diagnosis not present

## 2019-08-03 DIAGNOSIS — M353 Polymyalgia rheumatica: Secondary | ICD-10-CM | POA: Diagnosis not present

## 2019-08-03 DIAGNOSIS — H25813 Combined forms of age-related cataract, bilateral: Secondary | ICD-10-CM | POA: Diagnosis not present

## 2019-08-03 DIAGNOSIS — I5032 Chronic diastolic (congestive) heart failure: Secondary | ICD-10-CM | POA: Diagnosis not present

## 2019-08-03 DIAGNOSIS — J449 Chronic obstructive pulmonary disease, unspecified: Secondary | ICD-10-CM | POA: Diagnosis not present

## 2019-08-03 DIAGNOSIS — R7303 Prediabetes: Secondary | ICD-10-CM | POA: Diagnosis not present

## 2019-08-03 DIAGNOSIS — I252 Old myocardial infarction: Secondary | ICD-10-CM | POA: Diagnosis not present

## 2019-08-03 DIAGNOSIS — H10413 Chronic giant papillary conjunctivitis, bilateral: Secondary | ICD-10-CM | POA: Diagnosis not present

## 2019-08-03 DIAGNOSIS — E785 Hyperlipidemia, unspecified: Secondary | ICD-10-CM | POA: Diagnosis not present

## 2019-08-03 DIAGNOSIS — I69320 Aphasia following cerebral infarction: Secondary | ICD-10-CM | POA: Diagnosis not present

## 2019-08-03 DIAGNOSIS — Q2733 Arteriovenous malformation of digestive system vessel: Secondary | ICD-10-CM | POA: Diagnosis not present

## 2019-08-03 DIAGNOSIS — H40023 Open angle with borderline findings, high risk, bilateral: Secondary | ICD-10-CM | POA: Diagnosis not present

## 2019-08-03 DIAGNOSIS — G4733 Obstructive sleep apnea (adult) (pediatric): Secondary | ICD-10-CM | POA: Diagnosis not present

## 2019-08-03 DIAGNOSIS — M1A9XX0 Chronic gout, unspecified, without tophus (tophi): Secondary | ICD-10-CM | POA: Diagnosis not present

## 2019-08-03 DIAGNOSIS — K219 Gastro-esophageal reflux disease without esophagitis: Secondary | ICD-10-CM | POA: Diagnosis not present

## 2019-08-03 DIAGNOSIS — I130 Hypertensive heart and chronic kidney disease with heart failure and stage 1 through stage 4 chronic kidney disease, or unspecified chronic kidney disease: Secondary | ICD-10-CM | POA: Diagnosis not present

## 2019-08-08 DIAGNOSIS — M353 Polymyalgia rheumatica: Secondary | ICD-10-CM | POA: Diagnosis not present

## 2019-08-08 DIAGNOSIS — N2581 Secondary hyperparathyroidism of renal origin: Secondary | ICD-10-CM | POA: Diagnosis not present

## 2019-08-08 DIAGNOSIS — D631 Anemia in chronic kidney disease: Secondary | ICD-10-CM | POA: Diagnosis not present

## 2019-08-08 DIAGNOSIS — N184 Chronic kidney disease, stage 4 (severe): Secondary | ICD-10-CM | POA: Diagnosis not present

## 2019-08-08 DIAGNOSIS — I129 Hypertensive chronic kidney disease with stage 1 through stage 4 chronic kidney disease, or unspecified chronic kidney disease: Secondary | ICD-10-CM | POA: Diagnosis not present

## 2019-08-09 ENCOUNTER — Ambulatory Visit (INDEPENDENT_AMBULATORY_CARE_PROVIDER_SITE_OTHER): Payer: Medicare Other | Admitting: Gastroenterology

## 2019-08-09 ENCOUNTER — Encounter: Payer: Self-pay | Admitting: Gastroenterology

## 2019-08-09 ENCOUNTER — Other Ambulatory Visit: Payer: Self-pay

## 2019-08-09 ENCOUNTER — Telehealth: Payer: Self-pay | Admitting: *Deleted

## 2019-08-09 VITALS — BP 110/64 | HR 70 | Ht 63.5 in | Wt 158.0 lb

## 2019-08-09 DIAGNOSIS — D500 Iron deficiency anemia secondary to blood loss (chronic): Secondary | ICD-10-CM

## 2019-08-09 NOTE — Progress Notes (Signed)
[name]    [id number]    12/09/1946  Primary [place], [name], DO  Referring Physician: [name], [name], [address] [place],  [place] [address]   Chief complaint:  Chronic anemia  HPI:  73 year old African-American female with history of chronic A. fib on anticoagulation, congestive heart failure status post pacemaker, tracheostomy and chronic kidney disease Accompanied by her son  Chronic anemia with baseline hemoglobin 9-10, further decline to 8 since June 2020. No overt GI bleed. Fecal Hemoccult negative in 2018.  Patient was given stool cards at last visit but she did not send them back. Attempted to do small bowel video capsule  for further evaluation of possible occult GI blood loss patient was unable to swallow the capsule.  Could not undergo MR or CT enterography due to chronic kidney disease and low GFR  She has chronic fatigue with decreased energy but otherwise doing well  Relevant GI history:EGD in January 2013 with gastric AVM status post APC.History of adenomatous colon polyps and family history of colon cancer(brother diagnosed with colon cancer in his 68s).Last colonoscopy in August 2011 with removal of 4 small polyps is due for surveillance colonoscopy in 2016.Capsule endoscopy January 2013 was poor prep with no significant lesion to explain iron deficiency anemia.  EGD June 16, 2017: Normal Colonoscopy June 16, 2017 removal of 8 polyps, tubular adenomas   Outpatient Encounter Medications as of 08/09/2019  Medication Sig   acetaminophen (TYLENOL) 650 MG CR tablet Take 650-1,300 mg by mouth every 8 (eight) hours as needed for pain.   albuterol (PROVENTIL HFA;VENTOLIN HFA) 108 (90 Base) MCG/ACT inhaler Inhale 1-2 puffs into the lungs every 6 (six) hours as needed for wheezing or shortness of breath.   allopurinol (ZYLOPRIM) 100 MG tablet TAKE 2 TABLETS(200 MG) BY MOUTH DAILY   ALPRAZolam (XANAX) 0.5 MG  tablet Take 1 tablet (0.5 mg total) by mouth at bedtime as needed for anxiety.   apixaban (ELIQUIS) 5 MG TABS tablet Take 1 tablet (5 mg total) by mouth 2 (two) times daily.   bimatoprost (LUMIGAN) 0.01 % SOLN Place 1 drop into both eyes at bedtime.   cetirizine (ZYRTEC) 10 MG tablet Take 10 mg by mouth daily.   Cholecalciferol (VITAMIN D) 50 MCG (2000 UT) tablet Take 2,000 Units by mouth daily.   ferrous sulfate 325 (65 FE) MG tablet TAKE 1 TABLET BY MOUTH TWICE DAILY WITH MEALS TO KEEP BLOOD COUNT UP (Patient taking differently: Take 325 mg by mouth 2 (two) times daily with a meal. )   furosemide (LASIX) 40 MG tablet TAKE 1 TABLET BY MOUTH TWICE DAILY   ipratropium-albuterol (DUONEB) 0.5-2.5 (3) MG/3ML SOLN USE 3 ML VIA NEBULIZER EVERY 6 HOURS (Patient taking differently: Take 3 mLs by nebulization every 6 (six) hours as needed (shortness of breath). )   magnesium hydroxide (MILK OF MAGNESIA) 400 MG/5ML suspension Take 60 mLs by mouth daily as needed for mild constipation.    nitroGLYCERIN (NITROSTAT) 0.4 MG SL tablet Place 1 tablet (0.4 mg total) under the tongue every 5 (five) minutes as needed for chest pain.   Olopatadine HCl 0.2 % SOLN Place 1-2 drops into both eyes daily as needed (itching).    oxymetazoline (AFRIN NASAL SPRAY) 0.05 % nasal spray Place 1 spray into both nostrils 2 (two) times daily.   polyethylene glycol (MIRALAX / GLYCOLAX) packet Take 17 g by mouth daily as needed for mild constipation.   potassium  chloride (K-DUR) 10 MEQ tablet Take 1 tablet (10 mEq total) by mouth daily.   PREDNISONE PO Take 7.5 mg by mouth.   rosuvastatin (CRESTOR) 20 MG tablet Take 1 tablet (20 mg total) by mouth daily. (Patient taking differently: Take 20 mg by mouth at bedtime. )   sodium chloride (OCEAN) 0.65 % SOLN nasal spray Place 1 spray into both nostrils daily as needed for congestion.    triamcinolone cream (KENALOG) 0.1 % Apply 1 application topically 2 (two) times daily.  (Patient taking differently: Apply 1 application topically 2 (two) times daily as needed (rash). )   No facility-administered encounter medications on file as of 08/09/2019.     Allergies as of 08/09/2019 - Review Complete 08/09/2019  Allergen Reaction Noted   Ace inhibitors Swelling 03/15/2008   Other Anaphylaxis and Swelling 10/15/2016    Past Medical History:  Diagnosis Date   Anemia    Angina    Angioedema    2/2 ACE   Arteriovenous malformation of stomach    Arthritis    Asthma    AVM (arteriovenous malformation) of colon    small intestine; stomach   Blood transfusion    CHF (congestive heart failure) (HCC)    Complete heart block (HCC)    s/p PPM 5329   Complication of anesthesia    Difficult airway; anaphylaxis and swelling with propofol   DDD (degenerative disc disease)    Depression    Diastolic heart failure    Fatty liver 07/26/10   GERD (gastroesophageal reflux disease)    GI bleed    Headache    History of alcohol abuse Stopped Fall 2012   History of tobacco use Quit Fall 2012   Hx of cardiovascular stress test    a. Lexiscan Myoview (10/15):  Small inferolateral and apical defect c/w scar and poss soft tissue attenuation, no ischemia, EF 42%   Hx of colonic polyp 08/13/10   adenomatous   Hx of colonoscopy    Hyperlipidemia    Hyperlipidemia    Hypertension    Hypertrophic cardiomyopathy ([place])    dx by Dr [name] 2009   Iron deficiency anemia    Myocardial infarction [facility])    Panic attack    Panic attacks    Permanent atrial fibrillation    Pneumonia    Renal failure    baseline creatinine 1.6   Right arm pain 01/08/2012   RLS (restless legs syndrome)    Dx 06/2007   Shortness of breath    sob on exertation   Sleep apnea     Past Surgical History:  Procedure Laterality Date   CARDIAC CATHETERIZATION     CARDIAC CATHETERIZATION N/A 08/19/2016   Procedure: Right/Left Heart Cath and Coronary Angiography;   Surgeon: Jolaine Artist, MD;  Location: [facility];  Service: Cardiovascular;  Laterality: N/A;   COLONOSCOPY WITH PROPOFOL N/A 06/16/2017   Procedure: COLONOSCOPY WITH PROPOFOL;  Surgeon: Mauri Pole, MD;  Location: [facility];  Service: Endoscopy;  Laterality: N/A;   DIRECT LARYNGOSCOPY N/A 09/18/2016   Procedure: DIRECT LARYNGOSCOPY, BRONCHOSCOPY, REMOVAL OF INTUBATION GRANULOMA;  Surgeon: [name], MD;  Location[facility];  Service: ENT;  Laterality: N/A;   DIRECT LARYNGOSCOPY N/A 10/20/2016   Procedure: EXTUBATION AND FLEXIBLE LARYNGOSCOPE;  Surgeon: [name], MD;  Location: [facility];  Service: ENT;  Laterality: N/A;   DIRECT LARYNGOSCOPY N/A 10/29/2016   Procedure: DIRECT LARYNGOSCOPY;  Surgeon: [name], MD;  Location: [place] [facility];  Service: ENT;  Laterality: N/A;   ESOPHAGOGASTRODUODENOSCOPY  12/23/2011   Procedure: ESOPHAGOGASTRODUODENOSCOPY (EGD);  Surgeon: [name], MD;  Location: [facility];  Service: Endoscopy;  Laterality: N/A;   ESOPHAGOGASTRODUODENOSCOPY (EGD) WITH PROPOFOL N/A 06/16/2017   Procedure: ESOPHAGOGASTRODUODENOSCOPY (EGD) WITH PROPOFOL;  Surgeon: Mauri Pole, MD;  Location: [facility];  Service: Endoscopy;  Laterality: N/A;   EXTUBATION (ENDOTRACHEAL) IN OR N/A 07/21/2016   Procedure: EXTUBATION (ENDOTRACHEAL) IN OR;  Surgeon: [name], MD;  Location: [facility];  Service: ENT;  Laterality: N/A;   [place] STUDY  12/23/2011   Procedure: GIVENS CAPSULE STUDY;  Surgeon: [name], MD;  Location: [facility];  Service: Endoscopy;  Laterality: N/A;   MICROLARYNGOSCOPY WITH LASER N/A 10/16/2016   Procedure: [id number] WITH LASER ABLATION AND KENLOG INJECTION;  Surgeon: [name], MD;  Location: [facility];  Service: ENT;  Laterality: N/A;   [place], most recent gen change by JA 4/12   TRACHEOSTOMY TUBE PLACEMENT N/A 10/29/2016   Procedure: TRACHEOSTOMY;  Surgeon: [name], MD;   Location: [facility];  Service: ENT;  Laterality: N/A;   TUBAL LIGATION  04/01/2000    Family History  Problem Relation Age of Onset   Hypertension Mother    Stroke Mother    Heart disease Mother    Aneurysm Mother    CVA Mother    Hypertension Father    Stroke Father    Heart attack Father    Alcohol abuse Father    Hypertension Sister    Hypertension Brother    Colon cancer Brother    Cancer Brother    Diabetes Sister    Diabetes Brother    Heart attack Brother    Heart attack Brother    Heart disease Sister    Alcohol abuse Brother    Heart disease Daughter     Social History   Socioeconomic History   Marital status: Divorced    Spouse name: Not on file   Number of children: 4   Years of education: 9   Highest education level: Not on file  Occupational History   Occupation: disabled    Fish farm manager: UNEMPLOYED   Occupation: previously- Engineering geologist strain: Not on file   Food insecurity    Worry: Not on file    Inability: Not on file   Transportation needs    Medical: Not on file    Non-medical: Not on file  Tobacco Use   Smoking status: Former Smoker    Packs/day: 0.10    Years: 50.00    Pack years: 5.00    Types: Cigarettes    Quit date: 08/16/2011    Years since quitting: 7.9   Smokeless tobacco: Never Used   Tobacco comment: quit 2012  Substance and Sexual Activity   Alcohol use: No    Comment: quit 08/16/2011   Drug use: No   Sexual activity: Never  Lifestyle   Physical activity    Days per week: Not on file    Minutes per session: Not on file   Stress: Not on file  Relationships   Social connections    Talks on phone: Not on file    Gets together: Not on file    Attends religious service: Not on file    Active member of club or organization: Not on file    Attends meetings of clubs or organizations: Not on file    Relationship status: Not  on file   Intimate partner  violence    Fear of current or ex partner: Not on file    Emotionally abused: Not on file    Physically abused: Not on file    Forced sexual activity: Not on file  Other Topics Concern   Not on file  Social History Narrative   On disability for heart failure/pacemaker.     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    4 grown children,  8 grandchildren.     Drinks alcohol a few times a week socially. At one time, the most she reports drinking is about 3 mixed drinks.   Lives with daughter [name] and son. Will be moving 04/2011 to different house because concerned children are taking advantage of her financial situation.    No illicit drugs.   Smokes few cigarettes daily, especially when stressed. Started smoking @ age 33. Quit January 2012 2/2 health but started smoking again 02/2011.       Health Care POA:    Emergency Contact: daughter, Verdene Lennert [phone]   End of Life Plan: gave pt AD info pamphlet   Who lives with you: no one- lives in section 8 housing   Any pets: none   Diet: Pt has a varied diet of protein, starch and vegetables   Exercise: Pt has no regular exercise routine.   Seatbelts: Pt reports wearing seatbelt when in vehicles.    Hobbies: dancing      Review of systems: Review of Systems  Constitutional: Negative for fever and chills.  HENT: Negative.   Eyes: Negative for blurred vision.  Respiratory: Positive for cough, shortness of breath and wheezing.   Cardiovascular: Negative for chest pain and palpitations.  Gastrointestinal: as per HPI Genitourinary: Negative for dysuria, urgency, frequency and hematuria.  Musculoskeletal: Positive for myalgias, back pain and joint pain.  Skin: Negative for itching and rash.  Neurological: Negative for dizziness, tremors, focal weakness, seizures and loss of consciousness.  Endo/Heme/Allergies: Negative Psychiatric/Behavioral: Negative for depression, suicidal ideas and hallucinations.  All other systems  reviewed and are negative.   Physical Exam: Vitals:   08/09/19 0853  BP: 110/64  Pulse: 70   Body mass index is 27.55 kg/m. Gen:      No acute distress HEENT:  EOMI, sclera anicteric, trach Neck:     No masses; no thyromegaly Lungs:    Clear to auscultation bilaterally; normal respiratory effort CV:         Regular rate and rhythm; no murmurs Abd:      + bowel sounds; soft, non-tender; no palpable masses, no distension Ext:    No edema; adequate peripheral perfusion Skin:      Warm and dry; no rash Neuro: alert and oriented x 3 Psych: normal mood and affect  Data Reviewed:  Reviewed labs, radiology imaging, old records and pertinent past GI work up   Assessment and Plan/Recommendations:  73 year old female with multiple comorbidities including laryngeal tracheal stenosis status post tracheostomy, AV malformation brain, complete heart block status post pacemaker, COPD, CHF, A. fib on chronic anticoagulation and chronic kidney disease  Chronic anemia multifactorial  No overt GI bleeding but cannot exclude on and off small-volume occult GI blood loss  She is up-to-date with colorectal cancer screening EGD 2018 unremarkable  Given her multiple comorbidities, increased risk for potential procedure and anesthesia related complications with repeat endoscopic evaluation in the absence of overt GI bleeding  Advised patient to follow-up with PMD and nephrologist with routine  monitoring of CBC, iron panel with IV iron infusion as needed Continue oral iron supplements  Follow-up as needed  40 minutes was spent face-to-face with the patient and her family. Greater than 50% of the time used for counseling as well as treatment plan and follow-up. She had multiple questions which were answered to her satisfaction  K. Denzil Magnuson , MD    CC: [name], [name], DO

## 2019-08-09 NOTE — Patient Instructions (Signed)
Call or go to ER if you notice black tarry stools or large volume of blood per rectum  Follow up with routine labs with primary care doctor and kidney specialist   Follow up as needed  I appreciate the  opportunity to care for you  Thank You   Harl Bowie , MD

## 2019-08-09 NOTE — Telephone Encounter (Signed)
-----   Message from Corinna Lines sent at 07/27/2019  1:30 PM EDT ----- Regarding: referral   [place]  This patient has seen Dr. Carolin Sicks on 04-11-19 and has another appointment scheduled for 08-08-19.  [facility]

## 2019-08-10 DIAGNOSIS — J386 Stenosis of larynx: Secondary | ICD-10-CM | POA: Diagnosis not present

## 2019-08-10 DIAGNOSIS — G4733 Obstructive sleep apnea (adult) (pediatric): Secondary | ICD-10-CM | POA: Diagnosis not present

## 2019-08-10 DIAGNOSIS — I252 Old myocardial infarction: Secondary | ICD-10-CM | POA: Diagnosis not present

## 2019-08-10 DIAGNOSIS — Q2733 Arteriovenous malformation of digestive system vessel: Secondary | ICD-10-CM | POA: Diagnosis not present

## 2019-08-10 DIAGNOSIS — M353 Polymyalgia rheumatica: Secondary | ICD-10-CM | POA: Diagnosis not present

## 2019-08-10 DIAGNOSIS — I4821 Permanent atrial fibrillation: Secondary | ICD-10-CM | POA: Diagnosis not present

## 2019-08-10 DIAGNOSIS — I130 Hypertensive heart and chronic kidney disease with heart failure and stage 1 through stage 4 chronic kidney disease, or unspecified chronic kidney disease: Secondary | ICD-10-CM | POA: Diagnosis not present

## 2019-08-10 DIAGNOSIS — R1310 Dysphagia, unspecified: Secondary | ICD-10-CM | POA: Diagnosis not present

## 2019-08-10 DIAGNOSIS — E785 Hyperlipidemia, unspecified: Secondary | ICD-10-CM | POA: Diagnosis not present

## 2019-08-10 DIAGNOSIS — N183 Chronic kidney disease, stage 3 (moderate): Secondary | ICD-10-CM | POA: Diagnosis not present

## 2019-08-10 DIAGNOSIS — M1A09X0 Idiopathic chronic gout, multiple sites, without tophus (tophi): Secondary | ICD-10-CM | POA: Diagnosis not present

## 2019-08-10 DIAGNOSIS — I69320 Aphasia following cerebral infarction: Secondary | ICD-10-CM | POA: Diagnosis not present

## 2019-08-10 DIAGNOSIS — I700 Atherosclerosis of aorta: Secondary | ICD-10-CM | POA: Diagnosis not present

## 2019-08-10 DIAGNOSIS — M1990 Unspecified osteoarthritis, unspecified site: Secondary | ICD-10-CM | POA: Diagnosis not present

## 2019-08-10 DIAGNOSIS — M1A9XX0 Chronic gout, unspecified, without tophus (tophi): Secondary | ICD-10-CM | POA: Diagnosis not present

## 2019-08-10 DIAGNOSIS — K219 Gastro-esophageal reflux disease without esophagitis: Secondary | ICD-10-CM | POA: Diagnosis not present

## 2019-08-10 DIAGNOSIS — I209 Angina pectoris, unspecified: Secondary | ICD-10-CM | POA: Diagnosis not present

## 2019-08-10 DIAGNOSIS — I5032 Chronic diastolic (congestive) heart failure: Secondary | ICD-10-CM | POA: Diagnosis not present

## 2019-08-10 DIAGNOSIS — I442 Atrioventricular block, complete: Secondary | ICD-10-CM | POA: Diagnosis not present

## 2019-08-10 DIAGNOSIS — D508 Other iron deficiency anemias: Secondary | ICD-10-CM | POA: Diagnosis not present

## 2019-08-10 DIAGNOSIS — J449 Chronic obstructive pulmonary disease, unspecified: Secondary | ICD-10-CM | POA: Diagnosis not present

## 2019-08-10 DIAGNOSIS — I422 Other hypertrophic cardiomyopathy: Secondary | ICD-10-CM | POA: Diagnosis not present

## 2019-08-10 DIAGNOSIS — D631 Anemia in chronic kidney disease: Secondary | ICD-10-CM | POA: Diagnosis not present

## 2019-08-10 DIAGNOSIS — R7303 Prediabetes: Secondary | ICD-10-CM | POA: Diagnosis not present

## 2019-08-10 DIAGNOSIS — M2550 Pain in unspecified joint: Secondary | ICD-10-CM | POA: Diagnosis not present

## 2019-08-11 ENCOUNTER — Encounter: Payer: Self-pay | Admitting: Gastroenterology

## 2019-08-11 DIAGNOSIS — I442 Atrioventricular block, complete: Secondary | ICD-10-CM | POA: Diagnosis not present

## 2019-08-11 DIAGNOSIS — I130 Hypertensive heart and chronic kidney disease with heart failure and stage 1 through stage 4 chronic kidney disease, or unspecified chronic kidney disease: Secondary | ICD-10-CM | POA: Diagnosis not present

## 2019-08-11 DIAGNOSIS — I700 Atherosclerosis of aorta: Secondary | ICD-10-CM | POA: Diagnosis not present

## 2019-08-11 DIAGNOSIS — I252 Old myocardial infarction: Secondary | ICD-10-CM | POA: Diagnosis not present

## 2019-08-11 DIAGNOSIS — I5032 Chronic diastolic (congestive) heart failure: Secondary | ICD-10-CM | POA: Diagnosis not present

## 2019-08-11 DIAGNOSIS — D508 Other iron deficiency anemias: Secondary | ICD-10-CM | POA: Diagnosis not present

## 2019-08-11 DIAGNOSIS — Q2733 Arteriovenous malformation of digestive system vessel: Secondary | ICD-10-CM | POA: Diagnosis not present

## 2019-08-11 DIAGNOSIS — M1A9XX0 Chronic gout, unspecified, without tophus (tophi): Secondary | ICD-10-CM | POA: Diagnosis not present

## 2019-08-11 DIAGNOSIS — I209 Angina pectoris, unspecified: Secondary | ICD-10-CM | POA: Diagnosis not present

## 2019-08-11 DIAGNOSIS — R7303 Prediabetes: Secondary | ICD-10-CM | POA: Diagnosis not present

## 2019-08-11 DIAGNOSIS — I422 Other hypertrophic cardiomyopathy: Secondary | ICD-10-CM | POA: Diagnosis not present

## 2019-08-11 DIAGNOSIS — M1990 Unspecified osteoarthritis, unspecified site: Secondary | ICD-10-CM | POA: Diagnosis not present

## 2019-08-11 DIAGNOSIS — M353 Polymyalgia rheumatica: Secondary | ICD-10-CM | POA: Diagnosis not present

## 2019-08-11 DIAGNOSIS — K219 Gastro-esophageal reflux disease without esophagitis: Secondary | ICD-10-CM | POA: Diagnosis not present

## 2019-08-11 DIAGNOSIS — E785 Hyperlipidemia, unspecified: Secondary | ICD-10-CM | POA: Diagnosis not present

## 2019-08-11 DIAGNOSIS — G4733 Obstructive sleep apnea (adult) (pediatric): Secondary | ICD-10-CM | POA: Diagnosis not present

## 2019-08-11 DIAGNOSIS — J386 Stenosis of larynx: Secondary | ICD-10-CM | POA: Diagnosis not present

## 2019-08-11 DIAGNOSIS — N183 Chronic kidney disease, stage 3 (moderate): Secondary | ICD-10-CM | POA: Diagnosis not present

## 2019-08-11 DIAGNOSIS — I4821 Permanent atrial fibrillation: Secondary | ICD-10-CM | POA: Diagnosis not present

## 2019-08-11 DIAGNOSIS — D631 Anemia in chronic kidney disease: Secondary | ICD-10-CM | POA: Diagnosis not present

## 2019-08-11 DIAGNOSIS — J449 Chronic obstructive pulmonary disease, unspecified: Secondary | ICD-10-CM | POA: Diagnosis not present

## 2019-08-11 DIAGNOSIS — R1310 Dysphagia, unspecified: Secondary | ICD-10-CM | POA: Diagnosis not present

## 2019-08-11 DIAGNOSIS — I69320 Aphasia following cerebral infarction: Secondary | ICD-10-CM | POA: Diagnosis not present

## 2019-08-15 ENCOUNTER — Other Ambulatory Visit: Payer: Self-pay

## 2019-08-15 MED ORDER — ALPRAZOLAM 0.5 MG PO TABS: 0.5000 mg | ORAL_TABLET | Freq: Every evening | ORAL | 0 refills | Status: DC | PRN

## 2019-08-17 ENCOUNTER — Telehealth: Payer: Self-pay

## 2019-08-17 DIAGNOSIS — I69320 Aphasia following cerebral infarction: Secondary | ICD-10-CM

## 2019-08-17 NOTE — Telephone Encounter (Signed)
Referral placed.

## 2019-08-17 NOTE — Telephone Encounter (Signed)
[name], [name], calls nurse line stating the patient is being discharged next week from home health, out of visits. [name] is requesting a referral be put in for [facility], so patient can continue speech therapy. Please advise.

## 2019-08-19 ENCOUNTER — Other Ambulatory Visit: Payer: Self-pay | Admitting: Family Medicine

## 2019-08-19 ENCOUNTER — Other Ambulatory Visit: Payer: Self-pay

## 2019-08-19 MED ORDER — POTASSIUM CHLORIDE CRYS ER 10 MEQ PO TBCR: 10.0000 meq | EXTENDED_RELEASE_TABLET | Freq: Every day | ORAL | 0 refills | Status: DC

## 2019-08-23 ENCOUNTER — Telehealth: Payer: Self-pay | Admitting: Gastroenterology

## 2019-08-23 NOTE — Telephone Encounter (Signed)
She was last seen here 08/09/19. Office note was sent to the PMD. Please advise. Send the note to [place] nurse practitioner?

## 2019-08-23 NOTE — Telephone Encounter (Signed)
Yes, please forward the note to [facility] nurse practitioner.  Do not recommend any further GI work-up for anemia if she does not have any signs of overt GI bleed (melena, blood per rectum or hematochezia)

## 2019-08-24 ENCOUNTER — Other Ambulatory Visit (HOSPITAL_COMMUNITY): Payer: Self-pay | Admitting: *Deleted

## 2019-08-24 NOTE — Telephone Encounter (Signed)
No answer. Voicemail cannot accept messages. I need a fax number to send the note to.

## 2019-08-25 ENCOUNTER — Ambulatory Visit (HOSPITAL_COMMUNITY)
Admission: RE | Admit: 2019-08-25 | Discharge: 2019-08-25 | Disposition: A | Discharge status: Home / Self Care | Payer: Medicare (Managed Care) | Source: Ambulatory Visit | Attending: Nephrology | Admitting: Nephrology

## 2019-08-25 ENCOUNTER — Other Ambulatory Visit: Payer: Self-pay

## 2019-08-25 VITALS — BP 130/68 | HR 68 | Temp 97.1°F | Resp 20 | Ht 66.0 in | Wt 163.0 lb

## 2019-08-25 DIAGNOSIS — N183 Chronic kidney disease, stage 3 unspecified: Secondary | ICD-10-CM

## 2019-08-25 MED ORDER — CLONIDINE HCL 0.1 MG PO TABS: 0.1000 mg | ORAL_TABLET | Freq: Once | ORAL | Status: DC | PRN

## 2019-08-25 MED ORDER — EPOETIN ALFA-EPBX 10000 UNIT/ML IJ SOLN
15000.0000 [IU] | INTRAMUSCULAR | Status: DC
  Administered 2019-08-25: 15000 [IU] via SUBCUTANEOUS
  Filled 2019-08-25: qty 2

## 2019-08-25 MED ORDER — SODIUM CHLORIDE 0.9 % IV SOLN
510.0000 mg | INTRAVENOUS | Status: DC
  Administered 2019-08-25: 510 mg via INTRAVENOUS
  Filled 2019-08-25: qty 17

## 2019-08-26 DIAGNOSIS — Z87891 Personal history of nicotine dependence: Secondary | ICD-10-CM | POA: Diagnosis not present

## 2019-08-26 DIAGNOSIS — J386 Stenosis of larynx: Secondary | ICD-10-CM | POA: Diagnosis not present

## 2019-08-26 DIAGNOSIS — G4733 Obstructive sleep apnea (adult) (pediatric): Secondary | ICD-10-CM | POA: Diagnosis not present

## 2019-08-26 DIAGNOSIS — Z930 Tracheostomy status: Secondary | ICD-10-CM | POA: Diagnosis not present

## 2019-08-26 LAB — POCT HEMOGLOBIN-HEMACUE: Hemoglobin: 7.7 g/dL — ABNORMAL LOW (ref 12.0–15.0)

## 2019-08-31 ENCOUNTER — Other Ambulatory Visit (HOSPITAL_COMMUNITY): Payer: Self-pay | Admitting: *Deleted

## 2019-09-01 ENCOUNTER — Other Ambulatory Visit: Payer: Self-pay

## 2019-09-01 ENCOUNTER — Ambulatory Visit (HOSPITAL_COMMUNITY)
Admission: RE | Admit: 2019-09-01 | Discharge: 2019-09-01 | Disposition: A | Discharge status: Home / Self Care | Payer: Medicare (Managed Care) | Source: Ambulatory Visit | Attending: Nephrology | Admitting: Nephrology

## 2019-09-01 DIAGNOSIS — D631 Anemia in chronic kidney disease: Secondary | ICD-10-CM | POA: Insufficient documentation

## 2019-09-01 MED ORDER — SODIUM CHLORIDE 0.9 % IV SOLN
510.0000 mg | Freq: Once | INTRAVENOUS | Status: AC
  Administered 2019-09-01: 510 mg via INTRAVENOUS
  Filled 2019-09-01: qty 510

## 2019-09-01 NOTE — Telephone Encounter (Signed)
Forwarded the note to her PCP

## 2019-09-16 ENCOUNTER — Other Ambulatory Visit: Payer: Self-pay | Admitting: Family Medicine

## 2019-09-16 ENCOUNTER — Ambulatory Visit
Admission: RE | Admit: 2019-09-16 | Discharge: 2019-09-16 | Disposition: A | Discharge status: Home / Self Care | Payer: Medicare Other | Source: Ambulatory Visit | Attending: Family Medicine | Admitting: Family Medicine

## 2019-09-16 DIAGNOSIS — R0602 Shortness of breath: Secondary | ICD-10-CM

## 2019-09-22 ENCOUNTER — Other Ambulatory Visit: Payer: Self-pay

## 2019-09-22 ENCOUNTER — Ambulatory Visit (HOSPITAL_COMMUNITY)
Admission: RE | Admit: 2019-09-22 | Discharge: 2019-09-22 | Disposition: A | Discharge status: Home / Self Care | Payer: Medicare (Managed Care) | Source: Ambulatory Visit | Attending: Nephrology | Admitting: Nephrology

## 2019-09-22 VITALS — BP 105/65 | HR 70 | Resp 18

## 2019-09-22 DIAGNOSIS — N183 Chronic kidney disease, stage 3 unspecified: Secondary | ICD-10-CM

## 2019-09-22 LAB — POCT HEMOGLOBIN-HEMACUE: Hemoglobin: 8.3 g/dL — ABNORMAL LOW (ref 12.0–15.0)

## 2019-09-22 MED ORDER — EPOETIN ALFA-EPBX 10000 UNIT/ML IJ SOLN
15000.0000 [IU] | INTRAMUSCULAR | Status: DC
  Administered 2019-09-22: 15000 [IU] via SUBCUTANEOUS
  Filled 2019-09-22: qty 2

## 2019-09-29 ENCOUNTER — Encounter (HOSPITAL_COMMUNITY): Payer: Medicare Other

## 2019-09-30 ENCOUNTER — Ambulatory Visit (INDEPENDENT_AMBULATORY_CARE_PROVIDER_SITE_OTHER): Payer: Medicare (Managed Care) | Admitting: Cardiovascular Disease

## 2019-09-30 ENCOUNTER — Encounter: Payer: Self-pay | Admitting: Cardiovascular Disease

## 2019-09-30 ENCOUNTER — Other Ambulatory Visit: Payer: Self-pay

## 2019-09-30 VITALS — BP 127/78 | HR 70 | Temp 97.3°F | Ht 66.0 in | Wt 171.0 lb

## 2019-09-30 DIAGNOSIS — I10 Essential (primary) hypertension: Secondary | ICD-10-CM

## 2019-09-30 DIAGNOSIS — I421 Obstructive hypertrophic cardiomyopathy: Secondary | ICD-10-CM

## 2019-09-30 DIAGNOSIS — Z7901 Long term (current) use of anticoagulants: Secondary | ICD-10-CM

## 2019-09-30 DIAGNOSIS — Z8673 Personal history of transient ischemic attack (TIA), and cerebral infarction without residual deficits: Secondary | ICD-10-CM

## 2019-09-30 DIAGNOSIS — I482 Chronic atrial fibrillation, unspecified: Secondary | ICD-10-CM | POA: Diagnosis not present

## 2019-09-30 MED ORDER — METOPROLOL TARTRATE 25 MG PO TABS: 25.0000 mg | ORAL_TABLET | Freq: Two times a day (BID) | ORAL | 5 refills | Status: DC

## 2019-09-30 NOTE — Patient Instructions (Addendum)
Medication Instructions:  Please start taking metoprolol tartrate 25mg  twice daily.  We will send a prescription to the pharmacy.  Please pick this up and take to [facility] so that they can add it to your pill packs.  Lab Work: NONE   Testing/Procedures: NONE   Follow-Up: At [facility], you and your health needs are our priority.  As part of our continuing mission to provide you with exceptional heart care, we have created designated Provider Care Teams.  These Care Teams include your primary Cardiologist (physician) and Advanced Practice Providers (APPs -  Physician Assistants and Nurse Practitioners) who all work together to provide you with the care you need, when you need it.  Your physician recommends that you schedule a follow-up appointment in: WITH DR Lisbon 2-3 MONTHS  Your physician recommends that you schedule a follow-up appointment in: AT [place] [address]

## 2019-09-30 NOTE — Progress Notes (Signed)
Cardiology Office Note   Date:  09/30/2019   ID:  [name], DOB 03/09/1946, MRN [id number]  PCP:  [name], [name], DO  Cardiologist:   Skeet Latch, MD  Electrophysiologist: Thompson Grayer, MD   No chief complaint on file.   Patient ID: [name] is a 73 y.o. female with hypertrophic cardiomyopathy, hypertension, hyperlipidemia, CKD IV, chronic atrial fibrillation, (not anticoagulated due to GI bleed), chronic diastolic heart failure, complete heart block status post pacemaker ([employer]), and COPD who presents for follow up.    History of Present Illness: [name] was first seen on 12/14/15 as a referral from Dr. Rayann Heman. She was referred to general cardiology to establish care. She had been admitted to the hospital earlier that month with a complaint of chest pain and was noted to be mildly volume overloaded. Her BNP was 436 and echo revealed normal systolic function with grade 2 diastolic dysfunction. She was diuresed with IV Lasix and developed acute renal failure. Troponin was mildly elevated to 0.19.  Lexiscan Cardiolite was negative for ischemia.  [name] continued to struggle with palpitations and dizziness.  She has been in chronic atrial fibrillation and has not been on anticoagulation due to a GI bleed in 2012. She has known AVMs. She was referred for consideration of a Watchman device 02/7901, which was complicated by acute respiratory distress during the TEE that required intubation.  She developed acute biventricular failure that eventually normalized.  She was discharged to a SNF and was then readmitted with volume overload.  She has struggled with laryngeal stenosis managed by her ENT in [facility]  She is not a candidate for removal of her trach due to residual stenosis.  She has also been admitted with stroke 04/2019 with residual expressive aphasia.  She was subsequently started on Eliquis despite her GI bleed history.  The  following month see noted blood in her stool but h/h was stable.  She was noted to have acute on chronic renal failure and was referred to nephrology.  She last saw [name] 05/2019 and was doing well from a cardiac standpoint.  She continues to be stable.  She struggles with getting her words out and is frustrated by this.  She has occasional pain in her L breast that is non-exertional.  She continues to have shortness of breath with minimal exertion such as walking to her mailbox.  She [facility]ends [facility] weekly and is trying to exercise to increase her endurance.  She is frustrated that she cannot get her trach out.     Past Medical History:  Diagnosis Date   Anemia    Angina    Angioedema    2/2 ACE   Arteriovenous malformation of stomach    Arthritis    Asthma    AVM (arteriovenous malformation) of colon    small intestine; stomach   Blood transfusion    CHF (congestive heart failure) (HCC)    Complete heart block (HCC)    s/p PPM 4097   Complication of anesthesia    Difficult airway; anaphylaxis and swelling with propofol   DDD (degenerative disc disease)    Depression    Diastolic heart failure    Fatty liver 07/26/10   GERD (gastroesophageal reflux disease)    GI bleed    Headache    History of alcohol abuse Stopped Fall 2012   History of tobacco use Quit Fall 2012   Hx of cardiovascular stress test    a. Lexiscan  Myoview (10/15):  Small inferolateral and apical defect c/w scar and poss soft tissue attenuation, no ischemia, EF 42%   Hx of colonic polyp 08/13/10   adenomatous   Hx of colonoscopy    Hyperlipidemia    Hyperlipidemia    Hypertension    Hypertrophic cardiomyopathy ([place])    dx by Dr [name] 2009   Iron deficiency anemia    Myocardial infarction [facility])    Panic attack    Panic attacks    Permanent atrial fibrillation (HCC)    Pneumonia    Renal failure    baseline creatinine 1.6   Right arm pain 01/08/2012   RLS  (restless legs syndrome)    Dx 06/2007   Shortness of breath    sob on exertation   Sleep apnea     Past Surgical History:  Procedure Laterality Date   CARDIAC CATHETERIZATION     CARDIAC CATHETERIZATION N/A 08/19/2016   Procedure: Right/Left Heart Cath and Coronary Angiography;  Surgeon: Jolaine Artist, MD;  Location: [place] [facility];  Service: Cardiovascular;  Laterality: N/A;   COLONOSCOPY WITH PROPOFOL N/A 06/16/2017   Procedure: COLONOSCOPY WITH PROPOFOL;  Surgeon: Mauri Pole, MD;  Location: [facility];  Service: Endoscopy;  Laterality: N/A;   DIRECT LARYNGOSCOPY N/A 09/18/2016   Procedure: DIRECT LARYNGOSCOPY, BRONCHOSCOPY, REMOVAL OF INTUBATION GRANULOMA;  Surgeon: [name], MD;  Location: [facility] OR;  Service: ENT;  Laterality: N/A;   DIRECT LARYNGOSCOPY N/A 10/20/2016   Procedure: EXTUBATION AND FLEXIBLE LARYNGOSCOPE;  Surgeon: [name], MD;  Location: [facility] OR;  Service: ENT;  Laterality: N/A;   DIRECT LARYNGOSCOPY N/A 10/29/2016   Procedure: DIRECT LARYNGOSCOPY;  Surgeon: [name], MD;  Location: [facility] OR;  Service: ENT;  Laterality: N/A;   ESOPHAGOGASTRODUODENOSCOPY  12/23/2011   Procedure: ESOPHAGOGASTRODUODENOSCOPY (EGD);  Surgeon: [name], MD;  Location: [facility];  Service: Endoscopy;  Laterality: N/A;   ESOPHAGOGASTRODUODENOSCOPY (EGD) WITH PROPOFOL N/A 06/16/2017   Procedure: ESOPHAGOGASTRODUODENOSCOPY (EGD) WITH PROPOFOL;  Surgeon: Mauri Pole, MD;  Location: [facility];  Service: Endoscopy;  Laterality: N/A;   EXTUBATION (ENDOTRACHEAL) IN OR N/A 07/21/2016   Procedure: EXTUBATION (ENDOTRACHEAL) IN OR;  Surgeon: [name], MD;  Location: [place];  Service: ENT;  Laterality: N/A;   [place] STUDY  12/23/2011   Procedure: GIVENS CAPSULE STUDY;  Surgeon: [name], MD;  Location: [facility];  Service: Endoscopy;  Laterality: N/A;   MICROLARYNGOSCOPY WITH LASER N/A 10/16/2016   Procedure: MICRODIRECTLARYNGOSCOPY WITH  LASER ABLATION AND KENLOG INJECTION;  Surgeon: [name], MD;  Location: [place];  Service: ENT;  Laterality: N/A;   [place], most recent gen change by JA 4/12   TRACHEOSTOMY TUBE PLACEMENT N/A 10/29/2016   Procedure: TRACHEOSTOMY;  Surgeon: [name], MD;  Location: [place];  Service: ENT;  Laterality: N/A;   TUBAL LIGATION  04/01/2000     Current Outpatient Medications  Medication Sig Dispense Refill   acetaminophen (TYLENOL) 650 MG CR tablet Take 650-1,300 mg by mouth every 8 (eight) hours as needed for pain.     albuterol (PROVENTIL HFA;VENTOLIN HFA) 108 (90 Base) MCG/ACT inhaler Inhale 1-2 puffs into the lungs every 6 (six) hours as needed for wheezing or shortness of breath. 1 Inhaler 6   allopurinol (ZYLOPRIM) 100 MG tablet TAKE 2 TABLETS(200 MG) BY MOUTH DAILY 180 tablet 3   ALPRAZolam (XANAX) 0.5 MG tablet Take 1 tablet (0.5 mg total) by mouth at bedtime as  needed for anxiety. 30 tablet 0   apixaban (ELIQUIS) 5 MG TABS tablet Take 1 tablet (5 mg total) by mouth 2 (two) times daily. 180 tablet 1   bimatoprost (LUMIGAN) 0.01 % SOLN Place 1 drop into both eyes at bedtime.     cetirizine (ZYRTEC) 10 MG tablet Take 10 mg by mouth daily. prn     Cholecalciferol (VITAMIN D) 50 MCG (2000 UT) tablet Take 2,000 Units by mouth daily.     ferrous sulfate 325 (65 FE) MG tablet TAKE 1 TABLET BY MOUTH TWICE DAILY WITH MEALS TO KEEP BLOOD COUNT UP (Patient taking differently: Take 325 mg by mouth 2 (two) times daily with a meal. ) 60 tablet 11   furosemide (LASIX) 40 MG tablet TAKE 1 TABLET BY MOUTH TWICE DAILY 180 tablet 3   ipratropium-albuterol (DUONEB) 0.5-2.5 (3) MG/3ML SOLN USE 3 ML VIA NEBULIZER EVERY 6 HOURS (Patient taking differently: Take 3 mLs by nebulization every 6 (six) hours as needed (shortness of breath). ) 360 mL 0   magnesium hydroxide (MILK OF MAGNESIA) 400 MG/5ML suspension Take 60 mLs by mouth daily as needed for mild constipation.  prn     nitroGLYCERIN (NITROSTAT) 0.4 MG SL tablet Place 1 tablet (0.4 mg total) under the tongue every 5 (five) minutes as needed for chest pain. (Patient taking differently: Place 0.4 mg under the tongue every 5 (five) minutes as needed for chest pain. prn) 25 tablet 1   Olopatadine HCl 0.2 % SOLN Place 1-2 drops into both eyes daily as needed (itching).   1   oxymetazoline (AFRIN NASAL SPRAY) 0.05 % nasal spray Place 1 spray into both nostrils 2 (two) times daily. 30 mL 0   polyethylene glycol (MIRALAX / GLYCOLAX) packet Take 17 g by mouth daily as needed for mild constipation. 100 each 1   potassium chloride (K-DUR) 10 MEQ tablet Take 1 tablet (10 mEq total) by mouth daily. 90 tablet 0   predniSONE (DELTASONE) 1 MG tablet TK 4 TS PO ONCE A DAY     rosuvastatin (CRESTOR) 20 MG tablet Take 1 tablet (20 mg total) by mouth at bedtime. 90 tablet 3   sodium chloride (OCEAN) 0.65 % SOLN nasal spray Place 1 spray into both nostrils daily as needed for congestion.      triamcinolone cream (KENALOG) 0.1 % Apply 1 application topically 2 (two) times daily. (Patient taking differently: Apply 1 application topically 2 (two) times daily as needed (rash). ) 30 g 2   No current facility-administered medications for this visit.     Allergies:   Ace inhibitors and Other    Social History:  The patient  reports that she quit smoking about 8 years ago. Her smoking use included cigarettes. She has a 5.00 pack-year smoking history. She has never used smokeless tobacco. She reports that she does not drink alcohol or use drugs.   Family History:  The patient's family history includes Alcohol abuse in her brother and father; Aneurysm in her mother; CVA in her mother; Cancer in her brother; Colon cancer in her brother; Diabetes in her brother and sister; Heart attack in her brother, brother, and father; Heart disease in her daughter, mother, and sister; Hypertension in her brother, father, mother, and sister;  Stroke in her father and mother.    ROS:  Please see the history of present illness.   Otherwise, review of systems are positive for constipation.   All other systems are reviewed and negative.    PHYSICAL  EXAM: VS:  BP 127/78    Pulse 70    Temp (!) 97.3 F (36.3 C)    Ht 5\' 6"  (1.676 m)    Wt 171 lb (77.6 kg)    SpO2 96%    BMI 27.60 kg/m  , BMI Body mass index is 27.6 kg/m. GENERAL:  Chronically ill-appearing HEENT:  Pupils equal round and reactive, fundi not visualized, oral mucosa unremarkable.   NECK:  No jugular venous distention, waveform within normal limits, carotid upstroke brisk and symmetric, no bruits.  Trach in place. LUNGS:  Clear to auscultation bilaterally HEART:  Irregularly irregular.  PMI not displaced or sustained,S1 and S2 within normal limits, no S3, no S4, no clicks, no rubs, II/VI systolic murmur at LLSB CHEST: L chest wall tender to palpation ABD:  Flat, positive bowel sounds normal in frequency in pitch, no bruits, no rebound, no guarding, no midline pulsatile mass, no hepatomegaly, no splenomegaly EXT:  2 plus pulses throughout, no edema, no cyanosis no clubbing SKIN:  No rashes no nodules NEURO:  Cranial nerves II through XII grossly intact, motor grossly intact throughout PSYCH:  Cognitively intact, oriented to person place and time   EKG:  EKG is not ordered today. The ekg ordered 01/25/16 demonstrates ventricularly paced at 70 bpm. Likely atrial fibrillation.  Echo 05/09/19: IMPRESSIONS    1. The left ventricle has hyperdynamic systolic function, with an ejection fraction of >65%. The cavity size was normal. There is severely increased left ventricular wall thickness. Findings are consistent with hypertrophic cardiomyopathy. Left  ventricular diastolic Doppler parameters are consistent with pseudonormalization.  2. The right ventricle has normal systolic function. The cavity was normal. There is no increase in right ventricular wall thickness.  3.  Left atrial size was moderately dilated.  4. Right atrial size was moderately dilated.  5. Mitral valve regurgitation is moderate by color flow Doppler.  6. The aortic valve is tricuspid. Mild thickening of the aortic valve. Mild calcification of the aortic valve.  Echo 11/26/15: Study Conclusions  - Left ventricle: The cavity size was normal. There was severe concentric hypertrophy. Systolic function was vigorous. The estimated ejection fraction was in the range of 65% to 70%. Wall motion was normal; there were no regional wall motion abnormalities. Features are consistent with a pseudonormal left ventricular filling pattern, with concomitant abnormal relaxation and increased filling pressure (grade 2 diastolic dysfunction). - Aortic valve: Trileaflet; normal thickness, mildly calcified leaflets. - Mitral valve: There was trivial regurgitation. - Left atrium: The atrium was mildly dilated. - Atrial septum: There was increased thickness of the septum, consistent with lipomatous hypertrophy. - Tricuspid valve: There was trivial regurgitation.  ICD Interrogation 01/25/16:  [employer]. Just single chamber pacemaker Battery 2.78 V Presenting rhythm is atrial fibrillation Intermittently paced at 70 bpm. Underlying rhythm atrial fibrillation with complete heart block Threshold: 1V @ 0.82ms Sensing: not performed due to complete heart block. Impedance: 436 Ohms  No events noted   Recent Labs: 05/08/2019: ALT 10 05/09/2019: Magnesium 1.8 05/15/2019: B Natriuretic Peptide 158.6 07/28/2019: BUN 45; Creatinine, Ser 2.06; Platelets 304; Potassium 3.4; Sodium 138 09/22/2019: Hemoglobin 8.3    Lipid Panel    Component Value Date/Time   CHOL 136 05/09/2019 0402   CHOL 168 07/16/2017 1555   TRIG 99 05/09/2019 0402   HDL 60 05/09/2019 0402   HDL 48 07/16/2017 1555   CHOLHDL 2.3 05/09/2019 0402   VLDL 20 05/09/2019 0402   LDLCALC 56 05/09/2019 0402   LDLCALC 99 07/16/2017  1555     LDLDIRECT 161.8 02/21/2013 1416      Wt Readings from Last 3 Encounters:  09/30/19 171 lb (77.6 kg)  09/01/19 163 lb (73.9 kg)  08/25/19 163 lb (73.9 kg)      ASSESSMENT AND PLAN:  # Hypertension: Blood pressure is well-controlled.  She has exertional dyspnea and palpitations.  Given her HCM, we will try adding metoprolol 25mg  bid to see if that helps.    # Hyperlipidemia: Continue rosuvastatin.  LDL 56 on 04/2019.  # Chronic atrial fibrillation, PPM, CHB:  Managed by Dr. Rayann Heman. She has a history of GI bleed and AVMs. However she is on ELiquis due to stroke She is not a candidate for a Watchman due to complications of the screening TEE. This patients CHA2DS2-VASc Score and unadjusted Ischemic Stroke Rate (% per year) is equal to 9.7 % stroke rate/year from a score of 6  Above score calculated as 1 point each if present [CHF, HTN, DM, Vascular=MI/PAD/Aortic Plaque, Age if 65-74, or Female] Above score calculated as 2 points each if present [Age > 75, or Stroke/TIA/TE]  # Chronic diastolic heart failure,  HCM, demand ischemia:  Ms. Yott is euvolemic on exam today but has exertional dyspnea.  Continue lasix and add metoprolol as above.  Current medicines are reviewed at length with the patient today.  The patient does not have concerns regarding medicines.  The following changes have been made:  none  Labs/ tests ordered today include:   No orders of the defined types were placed in this encounter.   Disposition:   FU with Brayln Duque C. Oval Linsey, MD, [facility] in 2-3 months   Signed, Shakevia Sarris C. Oval Linsey, MD, [facility]  09/30/2019 3:40 PM    [place] she is she's grosslysince there is

## 2019-10-06 NOTE — Progress Notes (Deleted)
Electrophysiology Office Note Date: 10/06/2019  ID:  [name], [name] 10-16-1946, MRN [id number]  PCP: [name], [name], DO Primary Cardiologist: Skeet Latch, MD Electrophysiologist: Thompson Grayer, MD  CC: Pacemaker follow-up  [name] is a 73 y.o. female seen today for ***. she presents today for routine electrophysiology followup.  Since last being seen in our clinic, the patient reports doing very well.  she denies chest pain, palpitations, dyspnea, PND, orthopnea, nausea, vomiting, dizziness, syncope, edema, weight gain, or early satiety.  Device History: {Blank single:19197::"Medtronic","St. Jude","Boston Scientific","Biotronik"} {Blank single:19197::"Dual Chamber","Single Chamber","BiV"} PPM implanted *** for ***  Past Medical History:  Diagnosis Date  . Anemia   . Angina   . Angioedema    2/2 ACE  . Arteriovenous malformation of stomach   . Arthritis   . Asthma   . AVM (arteriovenous malformation) of colon    small intestine; stomach  . Blood transfusion   . CHF (congestive heart failure) ([place])   . Complete heart block ([place])    s/p PPM 1998  . Complication of anesthesia    Difficult airway; anaphylaxis and swelling with propofol  . DDD (degenerative disc disease)   . Depression   . Diastolic heart failure   . Fatty liver 07/26/10  . GERD (gastroesophageal reflux disease)   . GI bleed   . Headache   . History of alcohol abuse Stopped Fall 2012  . History of tobacco use Quit Fall 2012  . Hx of cardiovascular stress test    a. Lexiscan Myoview (10/15):  Small inferolateral and apical defect c/w scar and poss soft tissue attenuation, no ischemia, EF 42%  . Hx of colonic polyp 08/13/10   adenomatous  . Hx of colonoscopy   . Hyperlipidemia   . Hyperlipidemia   . Hypertension   . Hypertrophic cardiomyopathy ([place])    dx by Dr [name] 2009  . Iron deficiency anemia   . Myocardial infarction ([place])   . Panic attack   . Panic attacks   .  Permanent atrial fibrillation ([place])   . Pneumonia   . Renal failure    baseline creatinine 1.6  . Right arm pain 01/08/2012  . RLS (restless legs syndrome)    Dx 06/2007  . Shortness of breath    sob on exertation  . Sleep apnea    Past Surgical History:  Procedure Laterality Date  . CARDIAC CATHETERIZATION    . CARDIAC CATHETERIZATION N/A 08/19/2016   Procedure: Right/Left Heart Cath and Coronary Angiography;  Surgeon: Jolaine Artist, MD;  Location: [facility];  Service: Cardiovascular;  Laterality: N/A;  . COLONOSCOPY WITH PROPOFOL N/A 06/16/2017   Procedure: COLONOSCOPY WITH PROPOFOL;  Surgeon: Mauri Pole, MD;  Location: [facility];  Service: Endoscopy;  Laterality: N/A;  . DIRECT LARYNGOSCOPY N/A 09/18/2016   Procedure: DIRECT LARYNGOSCOPY, BRONCHOSCOPY, REMOVAL OF INTUBATION GRANULOMA;  Surgeon: [name], MD;  Location: [facility] OR;  Service: ENT;  Laterality: N/A;  . DIRECT LARYNGOSCOPY N/A 10/20/2016   Procedure: EXTUBATION AND FLEXIBLE LARYNGOSCOPE;  Surgeon: [name], MD;  Location: [facility];  Service: ENT;  Laterality: N/A;  . DIRECT LARYNGOSCOPY N/A 10/29/2016   Procedure: DIRECT LARYNGOSCOPY;  Surgeon: [name], MD;  Location: [facility];  Service: ENT;  Laterality: N/A;  . ESOPHAGOGASTRODUODENOSCOPY  12/23/2011   Procedure: ESOPHAGOGASTRODUODENOSCOPY (EGD);  Surgeon: [name], MD;  Location: [facility];  Service: Endoscopy;  Laterality: N/A;  . ESOPHAGOGASTRODUODENOSCOPY (EGD) WITH PROPOFOL N/A 06/16/2017   Procedure: ESOPHAGOGASTRODUODENOSCOPY (EGD) WITH  PROPOFOL;  Surgeon: Mauri Pole, MD;  Location: [facility];  Service: Endoscopy;  Laterality: N/A;  . EXTUBATION (ENDOTRACHEAL) IN OR N/A 07/21/2016   Procedure: EXTUBATION (ENDOTRACHEAL) IN OR;  Surgeon: [name], MD;  Location: [facility];  Service: ENT;  Laterality: N/A;  . GIVENS CAPSULE STUDY  12/23/2011   Procedure: GIVENS CAPSULE STUDY;  Surgeon: [name], MD;  Location: [facility];   Service: Endoscopy;  Laterality: N/A;  . MICROLARYNGOSCOPY WITH LASER N/A 10/16/2016   Procedure: MICRODIRECTLARYNGOSCOPY WITH LASER ABLATION AND KENLOG INJECTION;  Surgeon: [name], MD;  Location: [facility];  Service: ENT;  Laterality: N/A;  . PACEMAKER INSERTION  1998   st jude, most recent gen change by [name] 4/12  . TRACHEOSTOMY TUBE PLACEMENT N/A 10/29/2016   Procedure: TRACHEOSTOMY;  Surgeon: [name], MD;  Location: [facility];  Service: ENT;  Laterality: N/A;  . TUBAL LIGATION  04/01/2000    Current Outpatient Medications  Medication Sig Dispense Refill  . acetaminophen (TYLENOL) 650 MG CR tablet Take 650-1,300 mg by mouth every 8 (eight) hours as needed for pain.    [name] albuterol (PROVENTIL HFA;VENTOLIN HFA) 108 (90 Base) MCG/ACT inhaler Inhale 1-2 puffs into the lungs every 6 (six) hours as needed for wheezing or shortness of breath. 1 Inhaler 6  . allopurinol (ZYLOPRIM) 100 MG tablet TAKE 2 TABLETS(200 MG) BY MOUTH DAILY 180 tablet 3  . ALPRAZolam (XANAX) 0.5 MG tablet Take 1 tablet (0.5 mg total) by mouth at bedtime as needed for anxiety. 30 tablet 0  . apixaban (ELIQUIS) 5 MG TABS tablet Take 1 tablet (5 mg total) by mouth 2 (two) times daily. 180 tablet 1  . bimatoprost (LUMIGAN) 0.01 % SOLN Place 1 drop into both eyes at bedtime.    . cetirizine (ZYRTEC) 10 MG tablet Take 10 mg by mouth daily. prn    . Cholecalciferol (VITAMIN D) 50 MCG (2000 UT) tablet Take 2,000 Units by mouth daily.    . ferrous sulfate 325 (65 FE) MG tablet TAKE 1 TABLET BY MOUTH TWICE DAILY WITH MEALS TO KEEP BLOOD COUNT UP (Patient taking differently: Take 325 mg by mouth 2 (two) times daily with a meal. ) 60 tablet 11  . furosemide (LASIX) 40 MG tablet TAKE 1 TABLET BY MOUTH TWICE DAILY 180 tablet 3  . ipratropium-albuterol (DUONEB) 0.5-2.5 (3) MG/3ML SOLN USE 3 ML VIA NEBULIZER EVERY 6 HOURS (Patient taking differently: Take 3 mLs by nebulization every 6 (six) hours as needed (shortness of breath). ) 360 mL  0  . magnesium hydroxide (MILK OF MAGNESIA) 400 MG/5ML suspension Take 60 mLs by mouth daily as needed for mild constipation. prn    . metoprolol tartrate (LOPRESSOR) 25 MG tablet Take 1 tablet (25 mg total) by mouth 2 (two) times daily. 60 tablet 5  . nitroGLYCERIN (NITROSTAT) 0.4 MG SL tablet Place 1 tablet (0.4 mg total) under the tongue every 5 (five) minutes as needed for chest pain. (Patient taking differently: Place 0.4 mg under the tongue every 5 (five) minutes as needed for chest pain. prn) 25 tablet 1  . Olopatadine HCl 0.2 % SOLN Place 1-2 drops into both eyes daily as needed (itching).   1  . oxymetazoline (AFRIN NASAL SPRAY) 0.05 % nasal spray Place 1 spray into both nostrils 2 (two) times daily. 30 mL 0  . polyethylene glycol (MIRALAX / GLYCOLAX) packet Take 17 g by mouth daily as needed for mild constipation. 100 each 1  . potassium chloride (  K-DUR) 10 MEQ tablet Take 1 tablet (10 mEq total) by mouth daily. 90 tablet 0  . predniSONE (DELTASONE) 1 MG tablet TK 4 TS PO ONCE A DAY    . rosuvastatin (CRESTOR) 20 MG tablet Take 1 tablet (20 mg total) by mouth at bedtime. 90 tablet 3  . sodium chloride (OCEAN) 0.65 % SOLN nasal spray Place 1 spray into both nostrils daily as needed for congestion.     . triamcinolone cream (KENALOG) 0.1 % Apply 1 application topically 2 (two) times daily. (Patient taking differently: Apply 1 application topically 2 (two) times daily as needed (rash). ) 30 g 2   No current facility-administered medications for this visit.     Allergies:   Ace inhibitors and Other   Social History: Social History   Socioeconomic History  . Marital status: Divorced    Spouse name: Not on file  . Number of children: 4  . Years of education: 9  . Highest education level: Not on file  Occupational History  . Occupation: disabled    Fish farm manager: UNEMPLOYED  . Occupation: previously- Development worker, community  Social Needs  . Financial resource strain: Not on file  . Food  insecurity    Worry: Not on file    Inability: Not on file  . Transportation needs    Medical: Not on file    Non-medical: Not on file  Tobacco Use  . Smoking status: Former Smoker    Packs/day: 0.10    Years: 50.00    Pack years: 5.00    Types: Cigarettes    Quit date: 08/16/2011    Years since quitting: 8.1  . Smokeless tobacco: Never Used  . Tobacco comment: quit 2012  Substance and Sexual Activity  . Alcohol use: No    Comment: quit 08/16/2011  . Drug use: No  . Sexual activity: Never  Lifestyle  . Physical activity    Days per week: Not on file    Minutes per session: Not on file  . Stress: Not on file  Relationships  . Social Herbalist on phone: Not on file    Gets together: Not on file    Attends religious service: Not on file    Active member of club or organization: Not on file    Attends meetings of clubs or organizations: Not on file    Relationship status: Not on file  . Intimate partner violence    Fear of current or ex partner: Not on file    Emotionally abused: Not on file    Physically abused: Not on file    Forced sexual activity: Not on file  Other Topics Concern  . Not on file  Social History Narrative   On disability for heart failure/pacemaker.     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    4 grown children,  8 grandchildren.     Drinks alcohol a few times a week socially. At one time, the most she reports drinking is about 3 mixed drinks.   Lives with daughter [name] and son. Will be moving 04/2011 to different house because concerned children are taking advantage of her financial situation.    No illicit drugs.   Smokes few cigarettes daily, especially when stressed. Started smoking @ age 52. Quit January 2012 2/2 health but started smoking again 02/2011.       Health Care POA:    Emergency Contact: daughter, Verdene Lennert [phone]   End of Life Plan: gave  pt AD info pamphlet   Who lives with you: no one- lives in [address] housing   Any pets: none   Diet: Pt has a varied diet of protein, starch and vegetables   Exercise: Pt has no regular exercise routine.   Seatbelts: Pt reports wearing seatbelt when in vehicles.    Hobbies: dancing    Family History: Family History  Problem Relation Age of Onset  . Hypertension Mother   . Stroke Mother   . Heart disease Mother   . Aneurysm Mother   . CVA Mother   . Hypertension Father   . Stroke Father   . Heart attack Father   . Alcohol abuse Father   . Hypertension Sister   . Hypertension Brother   . Colon cancer Brother   . Cancer Brother   . Diabetes Sister   . Diabetes Brother   . Heart attack Brother   . Heart attack Brother   . Heart disease Sister   . Alcohol abuse Brother   . Heart disease Daughter      Review of Systems: All other systems reviewed and are otherwise negative except as noted above.  Physical Exam: There were no vitals filed for this visit.   GEN- The patient is well appearing, alert and oriented x 3 today.   HEENT: normocephalic, atraumatic; sclera clear, conjunctiva pink; hearing intact; oropharynx clear; neck supple  Lungs- Clear to ausculation bilaterally, normal work of breathing.  No wheezes, rales, rhonchi Heart- Regular rate and rhythm, no murmurs, rubs or gallops  GI- soft, non-tender, non-distended, bowel sounds present  Extremities- no clubbing, cyanosis, or edema  MS- no significant deformity or atrophy Skin- warm and dry, no rash or lesion; PPM pocket well healed Psych- euthymic mood, full affect Neuro- strength and sensation are intact  PPM Interrogation- reviewed in detail today,  See PACEART report  EKG:  EKG {ACTION; IS/IS [id number]} ordered today. The ekg ordered today shows ***  Recent Labs: 05/08/2019: ALT 10 05/09/2019: Magnesium 1.8 05/15/2019: B Natriuretic Peptide 158.6 07/28/2019: BUN 45; Creatinine, Ser 2.06; Platelets 304; Potassium 3.4; Sodium 138 09/22/2019: Hemoglobin 8.3   Wt  Readings from Last 3 Encounters:  09/30/19 171 lb (77.6 kg)  09/01/19 163 lb (73.9 kg)  08/25/19 163 lb (73.9 kg)     Other studies Reviewed: Additional studies/ records that were reviewed today include: Echo *** shows LVEF ***, Previous EP office notes, Previous remote checks, Most recent labwork.   Assessment and Plan:  1.  Symptomatic bradycardia due to CHB s/p St. Jude PPM  Normal PPM function See Pace Art report No changes today  2. Permanent Afib Not candidate for [place] due to recurrent GI bleeding  3. HTN Continue current regimen. [name] follows  4. Atypical chest pain Normal cors by previous cath  5. Chronic diastolic CHF  Echo 8/46/65 LVEF >65%  Current medicines are reviewed at length with the patient today.   The patient does not have concerns regarding her medicines.  The following changes were made today:  none  Labs/ tests ordered today include: *** No orders of the defined types were placed in this encounter.    Disposition:   Follow up with Dr. Rayann Heman in 12 months.    Jacalyn Lefevre, PA-C  10/06/2019 3:36 PM  [place] [phone] [phone] (office) [phone] (fax)

## 2019-10-07 ENCOUNTER — Ambulatory Visit: Payer: Medicare (Managed Care) | Admitting: Student

## 2019-10-20 ENCOUNTER — Encounter (HOSPITAL_COMMUNITY): Payer: Medicare (Managed Care)

## 2019-10-26 DIAGNOSIS — G4733 Obstructive sleep apnea (adult) (pediatric): Secondary | ICD-10-CM | POA: Diagnosis not present

## 2019-10-26 DIAGNOSIS — J386 Stenosis of larynx: Secondary | ICD-10-CM | POA: Diagnosis not present

## 2019-10-28 ENCOUNTER — Ambulatory Visit (HOSPITAL_COMMUNITY)
Admission: RE | Admit: 2019-10-28 | Discharge: 2019-10-28 | Disposition: A | Discharge status: Home / Self Care | Payer: Medicare (Managed Care) | Source: Ambulatory Visit | Attending: Nephrology | Admitting: Nephrology

## 2019-10-28 ENCOUNTER — Other Ambulatory Visit: Payer: Self-pay

## 2019-10-28 ENCOUNTER — Encounter: Payer: Self-pay | Admitting: Cardiovascular Disease

## 2019-10-28 VITALS — BP 107/78 | HR 69 | Temp 96.1°F | Resp 20

## 2019-10-28 DIAGNOSIS — N183 Chronic kidney disease, stage 3 unspecified: Secondary | ICD-10-CM | POA: Diagnosis not present

## 2019-10-28 LAB — PREPARE RBC (CROSSMATCH)

## 2019-10-28 MED ORDER — EPOETIN ALFA-EPBX 10000 UNIT/ML IJ SOLN: 15000.0000 [IU] | INTRAMUSCULAR | Status: DC

## 2019-10-28 MED ORDER — EPOETIN ALFA-EPBX 10000 UNIT/ML IJ SOLN
20000.0000 [IU] | Freq: Once | INTRAMUSCULAR | Status: AC
  Administered 2019-10-28: 20000 [IU] via SUBCUTANEOUS
  Filled 2019-10-28: qty 2

## 2019-10-28 NOTE — Progress Notes (Signed)
Hemocue today 6.6.  Pt stated she has seen dark stools and is tired and short of breath.  Pt stated she has not felt well the last couple of days.  Called and reported the above to [employer] at [facility].  Orders received to increase her retacrit to 20000 units weekly and to set up a transfusion for next week.  Made patient appointment cards for both appointments for next week and made copies for pace per her request.

## 2019-10-31 LAB — POCT HEMOGLOBIN-HEMACUE: Hemoglobin: 6.6 g/dL — CL (ref 12.0–15.0)

## 2019-11-01 ENCOUNTER — Other Ambulatory Visit: Payer: Self-pay

## 2019-11-01 ENCOUNTER — Encounter (HOSPITAL_COMMUNITY)
Admission: RE | Admit: 2019-11-01 | Discharge: 2019-11-01 | Disposition: A | Discharge status: Home / Self Care | Payer: Medicare (Managed Care) | Source: Ambulatory Visit | Attending: Nephrology | Admitting: Nephrology

## 2019-11-01 DIAGNOSIS — N183 Chronic kidney disease, stage 3 unspecified: Secondary | ICD-10-CM | POA: Diagnosis not present

## 2019-11-01 MED ORDER — SODIUM CHLORIDE 0.9% IV SOLUTION: Freq: Once | INTRAVENOUS | Status: DC

## 2019-11-02 LAB — TYPE AND SCREEN
ABO/RH(D): O POS
Antibody Screen: NEGATIVE
Unit division: 0

## 2019-11-02 LAB — BPAM RBC
Blood Product Expiration Date: 202012162359
ISSUE DATE / TIME: 202011170840
Unit Type and Rh: 5100

## 2019-11-04 ENCOUNTER — Encounter (HOSPITAL_COMMUNITY)
Admission: RE | Admit: 2019-11-04 | Discharge: 2019-11-04 | Disposition: A | Discharge status: Home / Self Care | Payer: Medicare (Managed Care) | Source: Ambulatory Visit | Attending: Nephrology | Admitting: Nephrology

## 2019-11-04 ENCOUNTER — Other Ambulatory Visit: Payer: Self-pay

## 2019-11-04 VITALS — BP 123/76 | HR 71 | Temp 97.3°F | Resp 20

## 2019-11-04 DIAGNOSIS — N183 Chronic kidney disease, stage 3 unspecified: Secondary | ICD-10-CM | POA: Diagnosis not present

## 2019-11-04 LAB — POCT HEMOGLOBIN-HEMACUE: Hemoglobin: 8.2 g/dL — ABNORMAL LOW (ref 12.0–15.0)

## 2019-11-04 MED ORDER — EPOETIN ALFA-EPBX 10000 UNIT/ML IJ SOLN
20000.0000 [IU] | INTRAMUSCULAR | Status: DC
  Administered 2019-11-04: 20000 [IU] via SUBCUTANEOUS
  Filled 2019-11-04: qty 2

## 2019-11-14 ENCOUNTER — Encounter (HOSPITAL_COMMUNITY): Payer: Medicare (Managed Care)

## 2019-11-17 ENCOUNTER — Encounter (HOSPITAL_COMMUNITY): Payer: Medicare (Managed Care)

## 2019-11-17 ENCOUNTER — Telehealth: Payer: Self-pay | Admitting: Cardiovascular Disease

## 2019-11-17 NOTE — Telephone Encounter (Signed)
Pt c/o swelling: STAT is pt has developed SOB within 24 hours  1) How much weight have you gained and in what time span? 3lbs in a week  2) If swelling, where is the swelling located? Hands and Legs  3) Are you currently taking a fluid pill? Yes   4) Are you currently SOB? No  5) Do you have a log of your daily weights (if so, list)? No  6) Have you gained 3 pounds in a day or 5 pounds in a week? No  7) Have you traveled recently? No   Beverly from [facility] is calling stating patient is having problems with swelling. She states the swelling was more on the left then the right and the patients daughter called today stating she now has swelling in hands as well. She feels patients fluid pill needs to be adjusted and would like to speak with a nurse in regards what changes need to be made.

## 2019-11-17 NOTE — Telephone Encounter (Signed)
I returned call to [facility]. She is unable to talk now and will call back. We became disconnected while I was giving her the call back number.

## 2019-11-17 NOTE — Telephone Encounter (Signed)
I spoke with Rise Paganini from [facility]. She reports at last office visit with Dr Oval Linsey pt's lasix was decreased to 20 mg daily as needed for shortness of breath, weight gain greater than 2 lbs in a day or 5 lbs in a week. Pt only weighs on Monday.  Weight this past Monday was up 3 lbs so she took lasix. On Tuesday Beverly saw pt who had swelling in both lower legs.  Left greater than right. Took prn lasix that day. Today pt's daughter called to let Rise Paganini know pt had swelling in both lower legs and hands. Is going to take prn lasix. Rise Paganini is calling to see if any changes need to be made to pt's medication due to swelling.

## 2019-11-18 NOTE — Telephone Encounter (Signed)
Spoke with [employer]. Patient had Lasix 20 mg Monday and Tuesday, Thursday she had 20 mg am and 40 mg afternoon. Her weight is was down 2 pounds yesterday. Rise Paganini spoke with nephrologist who had increased her Retacrit. Per [facility] nephrologist wants to keep patient at current dose of Retacrit and have patient go back on Lasix 40 mg twice a day, BMET 1 week. Rise Paganini wanted for Dr Oval Linsey to be aware of changes. Will forward for review

## 2019-11-21 ENCOUNTER — Encounter (HOSPITAL_COMMUNITY): Payer: Medicare (Managed Care)

## 2019-11-25 ENCOUNTER — Encounter (HOSPITAL_COMMUNITY): Payer: Medicare (Managed Care)

## 2019-11-29 ENCOUNTER — Other Ambulatory Visit (HOSPITAL_COMMUNITY): Payer: Self-pay | Admitting: *Deleted

## 2019-11-30 ENCOUNTER — Encounter (HOSPITAL_COMMUNITY)
Admission: RE | Admit: 2019-11-30 | Discharge: 2019-11-30 | Disposition: A | Discharge status: Home / Self Care | Payer: Medicare (Managed Care) | Source: Ambulatory Visit | Attending: Nephrology | Admitting: Nephrology

## 2019-11-30 ENCOUNTER — Other Ambulatory Visit: Payer: Self-pay

## 2019-11-30 DIAGNOSIS — N189 Chronic kidney disease, unspecified: Secondary | ICD-10-CM | POA: Insufficient documentation

## 2019-11-30 DIAGNOSIS — D631 Anemia in chronic kidney disease: Secondary | ICD-10-CM | POA: Diagnosis not present

## 2019-11-30 MED ORDER — SODIUM CHLORIDE 0.9 % IV SOLN
510.0000 mg | INTRAVENOUS | Status: DC
  Administered 2019-11-30: 510 mg via INTRAVENOUS
  Filled 2019-11-30: qty 17

## 2019-12-07 ENCOUNTER — Other Ambulatory Visit: Payer: Self-pay

## 2019-12-07 ENCOUNTER — Ambulatory Visit (HOSPITAL_COMMUNITY)
Admission: RE | Admit: 2019-12-07 | Discharge: 2019-12-07 | Disposition: A | Discharge status: Home / Self Care | Payer: Medicare (Managed Care) | Source: Ambulatory Visit | Attending: Nephrology | Admitting: Nephrology

## 2019-12-07 DIAGNOSIS — N189 Chronic kidney disease, unspecified: Secondary | ICD-10-CM | POA: Insufficient documentation

## 2019-12-07 DIAGNOSIS — D631 Anemia in chronic kidney disease: Secondary | ICD-10-CM | POA: Diagnosis not present

## 2019-12-07 MED ORDER — SODIUM CHLORIDE 0.9 % IV SOLN
510.0000 mg | INTRAVENOUS | Status: DC
  Administered 2019-12-07: 13:00:00 510 mg via INTRAVENOUS
  Filled 2019-12-07: qty 17

## 2019-12-13 ENCOUNTER — Emergency Department (HOSPITAL_COMMUNITY): Payer: Medicare (Managed Care)

## 2019-12-13 ENCOUNTER — Emergency Department (HOSPITAL_COMMUNITY)
Admission: EM | Admit: 2019-12-13 | Discharge: 2019-12-13 | Disposition: A | Discharge status: Home / Self Care | Payer: Medicare (Managed Care) | Attending: Emergency Medicine | Admitting: Emergency Medicine

## 2019-12-13 ENCOUNTER — Other Ambulatory Visit: Payer: Self-pay

## 2019-12-13 ENCOUNTER — Encounter (HOSPITAL_COMMUNITY): Payer: Self-pay | Admitting: Emergency Medicine

## 2019-12-13 DIAGNOSIS — R10819 Abdominal tenderness, unspecified site: Secondary | ICD-10-CM | POA: Diagnosis not present

## 2019-12-13 DIAGNOSIS — R0602 Shortness of breath: Secondary | ICD-10-CM | POA: Diagnosis present

## 2019-12-13 DIAGNOSIS — Z87891 Personal history of nicotine dependence: Secondary | ICD-10-CM | POA: Insufficient documentation

## 2019-12-13 DIAGNOSIS — I509 Heart failure, unspecified: Secondary | ICD-10-CM | POA: Diagnosis not present

## 2019-12-13 DIAGNOSIS — J449 Chronic obstructive pulmonary disease, unspecified: Secondary | ICD-10-CM | POA: Insufficient documentation

## 2019-12-13 DIAGNOSIS — Z20828 Contact with and (suspected) exposure to other viral communicable diseases: Secondary | ICD-10-CM | POA: Diagnosis not present

## 2019-12-13 DIAGNOSIS — Z79899 Other long term (current) drug therapy: Secondary | ICD-10-CM | POA: Insufficient documentation

## 2019-12-13 DIAGNOSIS — N183 Chronic kidney disease, stage 3 unspecified: Secondary | ICD-10-CM | POA: Insufficient documentation

## 2019-12-13 DIAGNOSIS — Z7901 Long term (current) use of anticoagulants: Secondary | ICD-10-CM | POA: Insufficient documentation

## 2019-12-13 DIAGNOSIS — Z930 Tracheostomy status: Secondary | ICD-10-CM | POA: Insufficient documentation

## 2019-12-13 LAB — PROTIME-INR
INR: 1.6 — ABNORMAL HIGH (ref 0.8–1.2)
Prothrombin Time: 19.2 seconds — ABNORMAL HIGH (ref 11.4–15.2)

## 2019-12-13 LAB — BASIC METABOLIC PANEL
Anion gap: 13 (ref 5–15)
BUN: 15 mg/dL (ref 8–23)
CO2: 26 mmol/L (ref 22–32)
Calcium: 9.7 mg/dL (ref 8.9–10.3)
Chloride: 99 mmol/L (ref 98–111)
Creatinine, Ser: 1.5 mg/dL — ABNORMAL HIGH (ref 0.44–1.00)
GFR calc Af Amer: 39 mL/min — ABNORMAL LOW (ref >60)
GFR calc non Af Amer: 34 mL/min — ABNORMAL LOW (ref >60)
Glucose, Bld: 126 mg/dL — ABNORMAL HIGH (ref 70–99)
Potassium: 4.4 mmol/L (ref 3.5–5.1)
Sodium: 138 mmol/L (ref 135–145)

## 2019-12-13 LAB — CBC
HCT: 37.6 % (ref 36.0–46.0)
Hemoglobin: 10.9 g/dL — ABNORMAL LOW (ref 12.0–15.0)
MCH: 27.2 pg (ref 26.0–34.0)
MCHC: 29 g/dL — ABNORMAL LOW (ref 30.0–36.0)
MCV: 93.8 fL (ref 80.0–100.0)
Platelets: 230 10*3/uL (ref 150–400)
RBC: 4.01 MIL/uL (ref 3.87–5.11)
RDW: 20 % — ABNORMAL HIGH (ref 11.5–15.5)
WBC: 7.6 10*3/uL (ref 4.0–10.5)
nRBC: 0 % (ref 0.0–0.2)

## 2019-12-13 LAB — SARS CORONAVIRUS 2 (TAT 6-24 HRS): SARS Coronavirus 2: NEGATIVE

## 2019-12-13 LAB — BRAIN NATRIURETIC PEPTIDE: B Natriuretic Peptide: 347.3 pg/mL — ABNORMAL HIGH (ref 0.0–100.0)

## 2019-12-13 MED ORDER — GUAIFENESIN ER 600 MG PO TB12: 600.0000 mg | ORAL_TABLET | Freq: Two times a day (BID) | ORAL | 1 refills | Status: DC | PRN

## 2019-12-13 MED ORDER — ALBUTEROL SULFATE HFA 108 (90 BASE) MCG/ACT IN AERS
4.0000 | INHALATION_SPRAY | Freq: Once | RESPIRATORY_TRACT | Status: AC
  Administered 2019-12-13: 4 via RESPIRATORY_TRACT
  Filled 2019-12-13: qty 6.7

## 2019-12-13 MED ORDER — LORATADINE 10 MG PO TABS: 10.0000 mg | ORAL_TABLET | Freq: Every day | ORAL | 0 refills | Status: DC

## 2019-12-13 NOTE — ED Notes (Signed)
ED Provider at bedside. 

## 2019-12-13 NOTE — ED Notes (Signed)
Dedrica, nurse from [place], to be updated at [phone].

## 2019-12-13 NOTE — ED Notes (Signed)
[name] [phone] for updates

## 2019-12-13 NOTE — ED Notes (Signed)
Respiratory at the bedside

## 2019-12-13 NOTE — Discharge Instructions (Addendum)
1.  Your chest x-ray did not show any pneumonia or congestion.  Your blood counts are within your typical range. 2.  Your tracheostomy was suctioned by the respiratory therapist.  There were no secretions in the lungs.  All of your mucus appears to be coming from your upper airways such as your sinuses and nasal passages.  Please start Mucinex and Claritin as prescribed. 3.  Make a follow-up appointment with your doctor soon as possible for recheck. 4.  The results of your Covid testing will be available within the next 6 to 24 hours.  At this time, your symptoms do not appear to be due to Covid although you should continue to isolate and be very careful at all times.

## 2019-12-13 NOTE — ED Triage Notes (Signed)
Reports bleeding around trach this morning and blood in secretions when trach was suctioned.  SOB and productive cough with yellow phlegm for approx 1 week.

## 2019-12-13 NOTE — ED Notes (Signed)
Respiratory Therapist called for trach suction and assessment.

## 2019-12-13 NOTE — ED Provider Notes (Signed)
[facility]Y DEPARTMENT Provider Note   CSN: [id number] Arrival date & time: 12/13/19  0747     History Chief Complaint  Patient presents with   Shortness of Breath   bleeding around trach    [name] is a 73 y.o. female.  HPI Patient reports that she has chest congestion.  She feels like she needs to cough up sputum but cannot get it coughed up to her tracheostomy.  She reports that she has had some bleeding around the tracheostomy.  When asked if she does home suctioning and inhalers, she endorsed that she does but had difficulty recalling when and under what circumstances, she suggested I would talk to her son and Claudia Desanctis of [facility] to get a better history on the pattern of suctioning and inhaler use.  She denies she had any fever.  No chest pain.  She does perceive she has had some swelling of her feet and hands.  Endorses generalized abdominal discomfort.    Past Medical History:  Diagnosis Date   Anemia    Angina    Angioedema    2/2 ACE   Arteriovenous malformation of stomach    Arthritis    Asthma    AVM (arteriovenous malformation) of colon    small intestine; stomach   Blood transfusion    CHF (congestive heart failure) (HCC)    Complete heart block (HCC)    s/p PPM 1027   Complication of anesthesia    Difficult airway; anaphylaxis and swelling with propofol   DDD (degenerative disc disease)    Depression    Diastolic heart failure    Fatty liver 07/26/10   GERD (gastroesophageal reflux disease)    GI bleed    Headache    History of alcohol abuse Stopped Fall 2012   History of tobacco use Quit Fall 2012   Hx of cardiovascular stress test    a. Lexiscan Myoview (10/15):  Small inferolateral and apical defect c/w scar and poss soft tissue attenuation, no ischemia, EF 42%   Hx of colonic polyp 08/13/10   adenomatous   Hx of colonoscopy    Hyperlipidemia    Hyperlipidemia    Hypertension     Hypertrophic cardiomyopathy ([place])    dx by Dr [name] 2009   Iron deficiency anemia    Myocardial infarction [facility])    Panic attack    Panic attacks    Permanent atrial fibrillation (HCC)    Pneumonia    Renal failure    baseline creatinine 1.6   Right arm pain 01/08/2012   RLS (restless legs syndrome)    Dx 06/2007   Shortness of breath    sob on exertation   Sleep apnea     Patient Active Problem List   Diagnosis Date Noted   Cough with hemoptysis 08/02/2019   SOB (shortness of breath)    Atypical chest pain    Chest pain on exertion 05/15/2019   Cerebral embolism with cerebral infarction 05/09/2019   Expressive aphasia 05/08/2019   Other abnormal glucose 11/26/2018   Polypharmacy 06/25/2018   Depression with anxiety    Tracheostomy in place [facility])    Tracheostomy care ([place])    Calculus of gallbladder without cholecystitis without obstruction 12/24/2017   Chronic cough 09/25/2017   History of colonic polyps    Moderate protein-calorie malnutrition ([place]) 06/02/2017   Chronic idiopathic constipation 03/19/2017   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   Permanent atrial fibrillation (HCC)    Laryngeal stenosis 09/18/2016  Esophageal dysphagia    Polymyalgia rheumatica ([place]) 04/10/2015   Hypertrophic cardiomyopathy ([place]) 02/15/2015   Eczema 09/18/2014   Environmental allergies 09/18/2014   Pre-diabetes 11/26/2013   COPD, moderate ([place]) 11/01/2013   Complete heart block ([place]) 04/03/2011   Anemia 02/17/2011   AVM (arteriovenous malformation) 02/13/2011   PACEMAKER-[place] 11/28/2010   CKD (chronic kidney disease) stage 3, GFR 30-59 ml/min (HCC) 09/24/2010   Acute on chronic congestive heart failure ([place]) 08/30/2010   ARTHRITIS 07/24/2010   Generalized anxiety disorder 07/23/2010   Hyperlipidemia 02/11/2007   Essential hypertension 02/11/2007   APNEA, SLEEP 02/11/2007    Past Surgical History:  Procedure Laterality Date    CARDIAC CATHETERIZATION     CARDIAC CATHETERIZATION N/A 08/19/2016   Procedure: Right/Left Heart Cath and Coronary Angiography;  Surgeon: Jolaine Artist, MD;  Location: [facility] CV LAB;  Service: Cardiovascular;  Laterality: N/A;   COLONOSCOPY WITH PROPOFOL N/A 06/16/2017   Procedure: COLONOSCOPY WITH PROPOFOL;  Surgeon: Mauri Pole, MD;  Location: [facility] ENDOSCOPY;  Service: Endoscopy;  Laterality: N/A;   DIRECT LARYNGOSCOPY N/A 09/18/2016   Procedure: DIRECT LARYNGOSCOPY, BRONCHOSCOPY, REMOVAL OF INTUBATION GRANULOMA;  Surgeon: [name], MD;  Location: [facility] OR;  Service: ENT;  Laterality: N/A;   DIRECT LARYNGOSCOPY N/A 10/20/2016   Procedure: EXTUBATION AND FLEXIBLE LARYNGOSCOPE;  Surgeon: [name], MD;  Location: [facility] OR;  Service: ENT;  Laterality: N/A;   DIRECT LARYNGOSCOPY N/A 10/29/2016   Procedure: DIRECT LARYNGOSCOPY;  Surgeon: [name], MD;  Location: [facility] OR;  Service: ENT;  Laterality: N/A;   ESOPHAGOGASTRODUODENOSCOPY  12/23/2011   Procedure: ESOPHAGOGASTRODUODENOSCOPY (EGD);  Surgeon: [name], MD;  Location: [facility];  Service: Endoscopy;  Laterality: N/A;   ESOPHAGOGASTRODUODENOSCOPY (EGD) WITH PROPOFOL N/A 06/16/2017   Procedure: ESOPHAGOGASTRODUODENOSCOPY (EGD) WITH PROPOFOL;  Surgeon: Mauri Pole, MD;  Location: [facility]OS[facility];  Service: Endoscopy;  Laterality: N/A;   EXTUBATION (ENDOTRACHEAL) IN OR N/A 07/21/2016   Procedure: EXTUBATION (ENDOTRACHEAL) IN OR;  Surgeon: [name], MD;  Location: [facility];  Service: ENT;  Laterality: N/A;   [place] STUDY  12/23/2011   Procedure: GIVENS CAPSULE STUDY;  Surgeon: [name], MD;  Location: [facility]OS[facility];  Service: Endoscopy;  Laterality: N/A;   MICROLARYNGOSCOPY WITH LASER N/A 10/16/2016   Procedure: MICRODIRECTLARYNGOSCOPY WITH LASER ABLATION AND KENLOG INJECTION;  Surgeon: [name], MD;  Location: [facility];  Service: ENT;  Laterality: N/A;   [place], most  recent gen change by JA 4/12   TRACHEOSTOMY TUBE PLACEMENT N/A 10/29/2016   Procedure: TRACHEOSTOMY;  Surgeon: [name], MD;  Location: [facility] [place];  Service: ENT;  Laterality: N/A;   TUBAL LIGATION  04/01/2000     OB History   No obstetric history on file.     Family History  Problem Relation Age of Onset   Hypertension Mother    Stroke Mother    Heart disease Mother    Aneurysm Mother    CVA Mother    Hypertension Father    Stroke Father    Heart attack Father    Alcohol abuse Father    Hypertension Sister    Hypertension Brother    Colon cancer Brother    Cancer Brother    Diabetes Sister    Diabetes Brother    Heart attack Brother    Heart attack Brother    Heart disease Sister    Alcohol abuse Brother    Heart disease Daughter  Social History   Tobacco Use   Smoking status: Former Smoker    Packs/day: 0.10    Years: 50.00    Pack years: 5.00    Types: Cigarettes    Quit date: 08/16/2011    Years since quitting: 8.3   Smokeless tobacco: Never Used   Tobacco comment: quit 2012  Substance Use Topics   Alcohol use: No    Comment: quit 08/16/2011   Drug use: No    Home Medications Prior to Admission medications   Medication Sig Start Date End Date Taking? Authorizing Provider  acetaminophen (TYLENOL) 650 MG CR tablet Take 650-1,300 mg by mouth every 8 (eight) hours as needed for pain.   Yes [provider]  albuterol (PROVENTIL HFA;VENTOLIN HFA) 108 (90 Base) MCG/ACT inhaler Inhale 1-2 puffs into the lungs every 6 (six) hours as needed for wheezing or shortness of breath. 08/04/18  Yes Riccio, Angela C, DO  allopurinol (ZYLOPRIM) 100 MG tablet TAKE 2 TABLETS(200 MG) BY MOUTH DAILY Patient taking differently: Take 200 mg by mouth daily.  05/18/19  Yes Riccio, Gardiner Rhyme, DO  ALPRAZolam (XANAX) 0.5 MG tablet Take 1 tablet (0.5 mg total) by mouth at bedtime as needed for anxiety. 08/15/19  Yes Enid Derry, [name], DO  apixaban  (ELIQUIS) 5 MG TABS tablet Take 1 tablet (5 mg total) by mouth 2 (two) times daily. 06/06/19  Yes Skeet Latch, MD  bimatoprost (LUMIGAN) 0.01 % SOLN Place 1 drop into both eyes at bedtime.   Yes [provider]  Cholecalciferol (VITAMIN D) 50 MCG (2000 UT) tablet Take 2,000 Units by mouth daily.   Yes [provider]  donepezil (ARICEPT ODT) 10 MG disintegrating tablet Take 10 mg by mouth at bedtime. Take with 5 mg to equal a total dose of 15 mg   Yes [provider]  donepezil (ARICEPT ODT) 5 MG disintegrating tablet Take 5 mg by mouth at bedtime. Pt takes with 10 mg to equal a total dose of 15 mg   Yes [provider]  Epoetin Alfa-epbx (RETACRIT IJ) Inject as directed.   Yes [provider]  ferrous sulfate 325 (65 FE) MG tablet TAKE 1 TABLET BY MOUTH TWICE DAILY WITH MEALS TO KEEP BLOOD COUNT UP Patient taking differently: Take 325 mg by mouth 2 (two) times daily with a meal.  08/26/16  Yes Riccio, Angela C, DO  fexofenadine (ALLEGRA) 180 MG tablet Take 180 mg by mouth daily.   Yes [provider]  furosemide (LASIX) 20 MG tablet Take 20 mg by mouth daily.   Yes [provider]  guaiFENesin (MUCINEX) 600 MG 12 hr tablet Take 600 mg by mouth 2 (two) times daily as needed.   Yes [provider]  ipratropium-albuterol (DUONEB) 0.5-2.5 (3) MG/3ML SOLN USE 3 ML VIA NEBULIZER EVERY 6 HOURS Patient taking differently: Take 3 mLs by nebulization every 6 (six) hours as needed (shortness of breath).  10/21/18  Yes Riccio, Levada Dy C, DO  magnesium hydroxide (MILK OF MAGNESIA) 400 MG/5ML suspension Take 60 mLs by mouth daily as needed for mild constipation. prn   Yes [provider]  metoprolol tartrate (LOPRESSOR) 25 MG tablet Take 1 tablet (25 mg total) by mouth 2 (two) times daily. 09/30/19  Yes Skeet Latch, MD  nitroGLYCERIN (NITROSTAT) 0.4 MG SL tablet Place 1 tablet (0.4 mg total) under the tongue every 5 (five)  minutes as needed for chest pain. Patient taking differently: Place 0.4 mg under the tongue every 5 (five) minutes  as needed for chest pain. prn 08/04/18  Yes Riccio, Angela C, DO  Olopatadine HCl 0.2 % SOLN Place 1-2 drops into both eyes daily as needed (itching).  02/17/18  Yes [provider]  oxymetazoline (AFRIN NASAL SPRAY) 0.05 % nasal spray Place 1 spray into both nostrils 2 (two) times daily. 05/21/19  Yes Hall-Potvin, [place], PA-C  polyethylene glycol (MIRALAX / GLYCOLAX) packet Take 17 g by mouth daily as needed for mild constipation. 11/26/18  Yes Riccio, Levada Dy C, DO  potassium chloride (K-DUR) 10 MEQ tablet Take 1 tablet (10 mEq total) by mouth daily. 08/19/19  Yes Enid Derry, [name], DO  predniSONE (DELTASONE) 1 MG tablet Take 4 mg by mouth daily with breakfast. Patient started on 12-06-19 for 4 week therapy. 08/10/19  Yes [provider]  rosuvastatin (CRESTOR) 20 MG tablet Take 1 tablet (20 mg total) by mouth at bedtime. 08/19/19  Yes Enid Derry, [name], DO  sodium chloride (OCEAN) 0.65 % SOLN nasal spray Place 1 spray into both nostrils daily as needed for congestion.    Yes [provider]  triamcinolone cream (KENALOG) 0.1 % Apply 1 application topically 2 (two) times daily. Patient taking differently: Apply 1 application topically 2 (two) times daily as needed (rash).  06/03/18  Yes Hensel, Jamal Collin, MD  cetirizine (ZYRTEC) 10 MG tablet Take 10 mg by mouth daily. prn    [provider]  furosemide (LASIX) 40 MG tablet TAKE 1 TABLET BY MOUTH TWICE DAILY Patient not taking: Reported on 12/13/2019 05/18/19   Steve Rattler, DO  guaiFENesin (MUCINEX) 600 MG 12 hr tablet Take 1 tablet (600 mg total) by mouth 2 (two) times daily as needed for to loosen phlegm. 12/13/19   Charlesetta Shanks, MD  loratadine (CLARITIN) 10 MG tablet Take 1 tablet (10 mg total) by mouth daily. One po daily x 5 days 12/13/19   Charlesetta Shanks, MD    Allergies    Ace inhibitors and  Other  Review of Systems   Review of Systems 10 Systems reviewed and are negative for acute change except as noted in the HPI.  Physical Exam Updated Vital Signs BP (!) 132/99    Pulse 77    Temp 99 F (37.2 C) (Oral)    Resp (!) 26    SpO2 98%   Physical Exam Constitutional:      Comments: Patient is alert and nontoxic.  No respiratory distress at rest.  Clinically well in appearance.  Occasional moist cough.  HENT:     Head: Normocephalic and atraumatic.     Nose:     Comments: Small amount of clear nasal drainage.    Mouth/Throat:     Mouth: Mucous membranes are moist.     Pharynx: Oropharynx is clear.     Comments: Mucous membranes are moist.  Airway is widely patent. Eyes:     Extraocular Movements: Extraocular movements intact.  Neck:     Comments: Tracheostomy in place.  Clean and dry.  No drainage or bleeding around it. Cardiovascular:     Rate and Rhythm: Normal rate and regular rhythm.     Comments: Paced rhythm on monitor. Pulmonary:     Effort: Pulmonary effort is normal.     Breath sounds: Normal breath sounds.  Abdominal:     General: There is no distension.     Palpations: Abdomen is soft.     Tenderness: There is abdominal tenderness. There is no guarding.     Comments: Patient's abdomen is soft.  She endorses some discomfort to palpation.  No localizing and no guarding.  Musculoskeletal:     Comments: No peripheral edema.  Patient does perceive swelling but at this time I do not appreciate any edema of the upper or lower extremities.  Calves are soft and pliable.  Skin:    General: Skin is warm and dry.  Neurological:     Comments: Patient is alert and interactive.  Her speech is clear.  Content is normal and situationally appropriate.  She is moving all extremities at will.  No gross focal motor deficit.  At this time patient is not ambulated or examined for finer neurologic function.  Psychiatric:        Mood and Affect: Mood normal.     ED Results /  Procedures / Treatments   Labs (all labs ordered are listed, but only abnormal results are displayed) Labs Reviewed  BASIC METABOLIC PANEL - Abnormal; Notable for the following components:      Result Value   Glucose, Bld 126 (*)    Creatinine, Ser 1.50 (*)    GFR calc non Af Amer 34 (*)    GFR calc Af Amer 39 (*)    All other components within normal limits  CBC - Abnormal; Notable for the following components:   Hemoglobin 10.9 (*)    MCHC 29.0 (*)    RDW 20.0 (*)    All other components within normal limits  BRAIN NATRIURETIC PEPTIDE - Abnormal; Notable for the following components:   B Natriuretic Peptide 347.3 (*)    All other components within normal limits  PROTIME-INR - Abnormal; Notable for the following components:   Prothrombin Time 19.2 (*)    INR 1.6 (*)    All other components within normal limits  SARS CORONAVIRUS 2 (TAT 6-24 HRS)    EKG EKG Interpretation  Date/Time:  Tuesday December 13 2019 07:55:05 EST Ventricular Rate:  71 PR Interval:    QRS Duration: 206 QT Interval:  486 QTC Calculation: 528 R Axis:   -101 Text Interpretation: Ventricular-paced rhythm Abnormal ECG no change Confirmed by Charlesetta Shanks [phone]) on 12/13/2019 1:49:14 PM   Radiology DG Chest 2 View  Result Date: 12/13/2019 CLINICAL DATA:  Shortness of breath, cough EXAM: CHEST - 2 VIEW COMPARISON:  09/16/2019 FINDINGS: Tracheostomy device is present and overlies the trachea. There is no new consolidation or edema. Stable cardiomediastinal contours with mild cardiomegaly. No pleural effusion or pneumothorax. Right chest wall dual lead pacemaker is present. IMPRESSION: No acute process in the chest. Electronically Signed   By: Macy Mis M.D.   On: 12/13/2019 08:44    Procedures Procedures (including critical care time)  Medications Ordered in ED Medications  albuterol (VENTOLIN HFA) 108 (90 Base) MCG/ACT inhaler 4 puff (4 puffs Inhalation Given 12/13/19 1006)    ED Course    I have reviewed the triage vital signs and the nursing notes.  Pertinent labs & imaging results that were available during my care of the patient were reviewed by me and considered in my medical decision making (see chart for details).    MDM Rules/Calculators/A&P                      Patient presents with chest congestion and concern for bleeding around tracheostomy.  Clinically, patient is well in appearance.  She does not have any respiratory distress.  Chest x-ray is clear.  Oxygen saturations are stable.  Hemoglobin is slightly above her normal baseline  today and no leukocytosis.  Patient was suctioned by respiratory, there were no secretions within the lower airways.  There was noted to be some upper secretion and drainage above the trach.  Findings consistent with URI with sinus and nasal drainage.  I do not suspect bacterial sinusitis at this time.  Patient is well without fever.  Will recommend Claritin and Mucinex for upper airway secretions.  Patient's Covid test will be pending.  At this time I have lower suspicion for Covid as etiology of symptoms.  Chest is clear on x-ray without hypoxia and patient does not endorse fever, myalgia or generalized symptomology suggestive of a systemic viral illness.  At this time I feel she is stable for discharge.  Patient's case coordinator at [place], [place] was contacted and the plan was reviewed. She will contact the patient's son. Final Clinical Impression(s) / ED Diagnoses Final diagnoses:  None    Rx / DC Orders ED Discharge Orders         Ordered    guaiFENesin (MUCINEX) 600 MG 12 hr tablet  2 times daily PRN     12/13/19 1337    loratadine (CLARITIN) 10 MG tablet  Daily     12/13/19 1337           Charlesetta Shanks, MD 12/13/19 1352

## 2019-12-14 ENCOUNTER — Encounter: Payer: Self-pay | Admitting: Cardiovascular Disease

## 2019-12-14 ENCOUNTER — Ambulatory Visit (INDEPENDENT_AMBULATORY_CARE_PROVIDER_SITE_OTHER): Payer: Medicare (Managed Care) | Admitting: Cardiovascular Disease

## 2019-12-14 VITALS — BP 108/66 | HR 70 | Ht 66.0 in | Wt 151.0 lb

## 2019-12-14 DIAGNOSIS — E7800 Pure hypercholesterolemia, unspecified: Secondary | ICD-10-CM | POA: Diagnosis not present

## 2019-12-14 DIAGNOSIS — I5032 Chronic diastolic (congestive) heart failure: Secondary | ICD-10-CM | POA: Diagnosis not present

## 2019-12-14 DIAGNOSIS — I10 Essential (primary) hypertension: Secondary | ICD-10-CM

## 2019-12-14 DIAGNOSIS — I422 Other hypertrophic cardiomyopathy: Secondary | ICD-10-CM

## 2019-12-14 MED ORDER — FUROSEMIDE 40 MG PO TABS: 40.0000 mg | ORAL_TABLET | Freq: Two times a day (BID) | ORAL | 3 refills | Status: DC

## 2019-12-14 MED ORDER — FUROSEMIDE 20 MG PO TABS: 20.0000 mg | ORAL_TABLET | Freq: Every day | ORAL | 6 refills | Status: DC

## 2019-12-14 NOTE — Progress Notes (Signed)
Cardiology Office Note   Date:  12/14/2019   ID:  Shaylee, Stanislawski [birth year], MRN [id number]  PCP:  [name], [name], DO  Cardiologist:   Skeet Latch, MD  Electrophysiologist: Thompson Grayer, MD   No chief complaint on file.   Patient ID: [name] is a 73 y.o. female with hypertrophic cardiomyopathy, hypertension, hyperlipidemia, CKD IV, chronic atrial fibrillation, (not anticoagulated due to GI bleed), chronic diastolic heart failure, complete heart block status post pacemaker (St. Jude), and COPD who presents for follow up.    History of Present Illness: [name] was first seen on 12/14/15 as a referral from Dr. Rayann Heman. She was referred to general cardiology to establish care. She had been admitted to the hospital earlier that month with a complaint of chest pain and was noted to be mildly volume overloaded. Her BNP was 436 and echo revealed normal systolic function with grade 2 diastolic dysfunction. She was diuresed with IV Lasix and developed acute renal failure. Troponin was mildly elevated to 0.19.  Lexiscan Cardiolite was negative for ischemia.  [name] continued to struggle with palpitations and dizziness.  She has been in chronic atrial fibrillation and has not been on anticoagulation due to a GI bleed in 2012. She has known AVMs. She was referred for consideration of a Watchman device 06/8468, which was complicated by acute respiratory distress during the TEE that required intubation.  She developed acute biventricular failure that eventually normalized.  She was discharged to a SNF and was then readmitted with volume overload.  She has struggled with laryngeal stenosis managed by her ENT in [facility].  She is not a candidate for removal of her trach due to residual stenosis.  She has also been admitted with stroke 04/2019 with residual expressive aphasia.  She was subsequently started on Eliquis despite her GI bleed history.  The  following month see noted blood in her stool but h/h was stable.  She was noted to have acute on chronic renal failure and was referred to nephrology.  She last saw [name] 05/2019 and was doing well from a cardiac standpoint.  She continues to be stable.  She struggles with getting her words out and is frustrated by this.  She has occasional pain in her L breast that is non-exertional.  She continues to have shortness of breath with minimal exertion such as walking to her mailbox.  She attends [facility] weekly and is trying to exercise to increase her endurance.  She remains frustrated that she cannot get her trach out.    Since her last appointment [name] has been doing OK.  At that appointment metoprolol was added due to HCM, palpitations, and dyspnea.  She thinks that this has helped.  She is noticing fewer palpitations.  She still gets short of breath with activities.  She denies orthopnea, edema, or PND at this time but does note some mild edema at times.  She has no chest pain or pressure.  She was seen in the ED 12/29 with bleeding around her trach and chest congestion.  She has noted increased secretions.  She denies fever or chills.  She has otherwise been well.  CXR showed no acute process.  BNP was elevated to 347  Past Medical History:  Diagnosis Date  . Anemia   . Angina   . Angioedema    2/2 ACE  . Arteriovenous malformation of stomach   . Arthritis   . Asthma   . AVM (arteriovenous  malformation) of colon    small intestine; stomach  . Blood transfusion   . CHF (congestive heart failure) ([place])   . Complete heart block ([place])    s/p PPM 1998  . Complication of anesthesia    Difficult airway; anaphylaxis and swelling with propofol  . DDD (degenerative disc disease)   . Depression   . Diastolic heart failure   . Fatty liver 07/26/10  . GERD (gastroesophageal reflux disease)   . GI bleed   . Headache   . History of alcohol abuse Stopped Fall 2012  . History of  tobacco use Quit Fall 2012  . Hx of cardiovascular stress test    a. Lexiscan Myoview (10/15):  Small inferolateral and apical defect c/w scar and poss soft tissue attenuation, no ischemia, EF 42%  . Hx of colonic polyp 08/13/10   adenomatous  . Hx of colonoscopy   . Hyperlipidemia   . Hyperlipidemia   . Hypertension   . Hypertrophic cardiomyopathy ([place])    dx by Dr [name] 2009  . Iron deficiency anemia   . Myocardial infarction ([place])   . Panic attack   . Panic attacks   . Permanent atrial fibrillation ([place])   . Pneumonia   . Renal failure    baseline creatinine 1.6  . Right arm pain 01/08/2012  . RLS (restless legs syndrome)    Dx 06/2007  . Shortness of breath    sob on exertation  . Sleep apnea     Past Surgical History:  Procedure Laterality Date  . CARDIAC CATHETERIZATION    . CARDIAC CATHETERIZATION N/A 08/19/2016   Procedure: Right/Left Heart Cath and Coronary Angiography;  Surgeon: Jolaine Artist, MD;  Location: [place] [facility];  Service: Cardiovascular;  Laterality: N/A;  . COLONOSCOPY WITH PROPOFOL N/A 06/16/2017   Procedure: COLONOSCOPY WITH PROPOFOL;  Surgeon: Mauri Pole, MD;  Location: [facility];  Service: Endoscopy;  Laterality: N/A;  . DIRECT LARYNGOSCOPY N/A 09/18/2016   Procedure: DIRECT LARYNGOSCOPY, BRONCHOSCOPY, REMOVAL OF INTUBATION GRANULOMA;  Surgeon: [name], MD;  Location: [facility] OR;  Service: ENT;  Laterality: N/A;  . DIRECT LARYNGOSCOPY N/A 10/20/2016   Procedure: EXTUBATION AND FLEXIBLE LARYNGOSCOPE;  Surgeon: [name], MD;  Location: [place];  Service: ENT;  Laterality: N/A;  . DIRECT LARYNGOSCOPY N/A 10/29/2016   Procedure: DIRECT LARYNGOSCOPY;  Surgeon: [name], MD;  Location: [facility] OR;  Service: ENT;  Laterality: N/A;  . ESOPHAGOGASTRODUODENOSCOPY  12/23/2011   Procedure: ESOPHAGOGASTRODUODENOSCOPY (EGD);  Surgeon: [name], MD;  Location: [facility];  Service: Endoscopy;  Laterality: N/A;  . ESOPHAGOGASTRODUODENOSCOPY  (EGD) WITH PROPOFOL N/A 06/16/2017   Procedure: ESOPHAGOGASTRODUODENOSCOPY (EGD) WITH PROPOFOL;  Surgeon: Mauri Pole, MD;  Location: [facility];  Service: Endoscopy;  Laterality: N/A;  . EXTUBATION (ENDOTRACHEAL) IN OR N/A 07/21/2016   Procedure: EXTUBATION (ENDOTRACHEAL) IN OR;  Surgeon: [name], MD;  Location: [place];  Service: ENT;  Laterality: N/A;  . GIVENS CAPSULE STUDY  12/23/2011   Procedure: GIVENS CAPSULE STUDY;  Surgeon: [name], MD;  Location: [facility];  Service: Endoscopy;  Laterality: N/A;  . MICROLARYNGOSCOPY WITH LASER N/A 10/16/2016   Procedure: MICRODIRECTLARYNGOSCOPY WITH LASER ABLATION AND KENLOG INJECTION;  Surgeon: [name], MD;  Location: [place];  Service: ENT;  Laterality: N/A;  . PACEMAKER INSERTION  1998   st jude, most recent gen change by [name] 4/12  . TRACHEOSTOMY TUBE PLACEMENT N/A 10/29/2016   Procedure: TRACHEOSTOMY;  Surgeon: [name], MD;  Location: [facility] OR;  Service: ENT;  Laterality: N/A;  . TUBAL LIGATION  04/01/2000     Current Outpatient Medications  Medication Sig Dispense Refill  . acetaminophen (TYLENOL) 650 MG CR tablet Take 650-1,300 mg by mouth every 8 (eight) hours as needed for pain.    [name] albuterol (PROVENTIL HFA;VENTOLIN HFA) 108 (90 Base) MCG/ACT inhaler Inhale 1-2 puffs into the lungs every 6 (six) hours as needed for wheezing or shortness of breath. 1 Inhaler 6  . allopurinol (ZYLOPRIM) 100 MG tablet TAKE 2 TABLETS(200 MG) BY MOUTH DAILY (Patient taking differently: Take 200 mg by mouth daily. ) 180 tablet 3  . ALPRAZolam (XANAX) 0.5 MG tablet Take 1 tablet (0.5 mg total) by mouth at bedtime as needed for anxiety. 30 tablet 0  . apixaban (ELIQUIS) 5 MG TABS tablet Take 1 tablet (5 mg total) by mouth 2 (two) times daily. 180 tablet 1  . bimatoprost (LUMIGAN) 0.01 % SOLN Place 1 drop into both eyes at bedtime.    . cetirizine (ZYRTEC) 10 MG tablet Take 10 mg by mouth daily. prn    . Cholecalciferol (VITAMIN D) 50 MCG  (2000 UT) tablet Take 2,000 Units by mouth daily.    [name] donepezil (ARICEPT ODT) 10 MG disintegrating tablet Take 10 mg by mouth at bedtime. Take with 5 mg to equal a total dose of 15 mg    . donepezil (ARICEPT ODT) 5 MG disintegrating tablet Take 5 mg by mouth at bedtime. Pt takes with 10 mg to equal a total dose of 15 mg    . Epoetin Alfa-epbx (RETACRIT IJ) Inject as directed.    . ferrous sulfate 325 (65 FE) MG tablet TAKE 1 TABLET BY MOUTH TWICE DAILY WITH MEALS TO KEEP BLOOD COUNT UP (Patient taking differently: Take 325 mg by mouth 2 (two) times daily with a meal. ) 60 tablet 11  . fexofenadine (ALLEGRA) 180 MG tablet Take 180 mg by mouth daily.    . furosemide (LASIX) 20 MG tablet Take 20 mg by mouth daily.    . furosemide (LASIX) 40 MG tablet TAKE 1 TABLET BY MOUTH TWICE DAILY 180 tablet 3  . guaiFENesin (MUCINEX) 600 MG 12 hr tablet Take 600 mg by mouth 2 (two) times daily as needed.    [name] guaiFENesin (MUCINEX) 600 MG 12 hr tablet Take 1 tablet (600 mg total) by mouth 2 (two) times daily as needed for to loosen phlegm. 20 tablet 1  . ipratropium-albuterol (DUONEB) 0.5-2.5 (3) MG/3ML SOLN USE 3 ML VIA NEBULIZER EVERY 6 HOURS (Patient taking differently: Take 3 mLs by nebulization every 6 (six) hours as needed (shortness of breath). ) 360 mL 0  . loratadine (CLARITIN) 10 MG tablet Take 1 tablet (10 mg total) by mouth daily. One po daily x 5 days 14 tablet 0  . magnesium hydroxide (MILK OF MAGNESIA) 400 MG/5ML suspension Take 60 mLs by mouth daily as needed for mild constipation. prn    . metoprolol tartrate (LOPRESSOR) 25 MG tablet Take 1 tablet (25 mg total) by mouth 2 (two) times daily. 60 tablet 5  . nitroGLYCERIN (NITROSTAT) 0.4 MG SL tablet Place 1 tablet (0.4 mg total) under the tongue every 5 (five) minutes as needed for chest pain. (Patient taking differently: Place 0.4 mg under the tongue every 5 (five) minutes as needed for chest pain. prn) 25 tablet 1  . Olopatadine HCl 0.2 % SOLN Place  1-2 drops into both eyes daily as needed (itching).   1  .  oxymetazoline (AFRIN NASAL SPRAY) 0.05 % nasal spray Place 1 spray into both nostrils 2 (two) times daily. 30 mL 0  . polyethylene glycol (MIRALAX / GLYCOLAX) packet Take 17 g by mouth daily as needed for mild constipation. 100 each 1  . potassium chloride (K-DUR) 10 MEQ tablet Take 1 tablet (10 mEq total) by mouth daily. 90 tablet 0  . predniSONE (DELTASONE) 1 MG tablet Take 4 mg by mouth daily with breakfast. Patient started on 12-06-19 for 4 week therapy.    . rosuvastatin (CRESTOR) 20 MG tablet Take 1 tablet (20 mg total) by mouth at bedtime. 90 tablet 3  . sodium chloride (OCEAN) 0.65 % SOLN nasal spray Place 1 spray into both nostrils daily as needed for congestion.     . triamcinolone cream (KENALOG) 0.1 % Apply 1 application topically 2 (two) times daily. (Patient taking differently: Apply 1 application topically 2 (two) times daily as needed (rash). ) 30 g 2   No current facility-administered medications for this visit.    Allergies:   Ace inhibitors and Other    Social History:  The patient  reports that she quit smoking about 8 years ago. Her smoking use included cigarettes. She has a 5.00 pack-year smoking history. She has never used smokeless tobacco. She reports that she does not drink alcohol or use drugs.   Family History:  The patient's family history includes Alcohol abuse in her brother and father; Aneurysm in her mother; CVA in her mother; Cancer in her brother; Colon cancer in her brother; Diabetes in her brother and sister; Heart attack in her brother, brother, and father; Heart disease in her daughter, mother, and sister; Hypertension in her brother, father, mother, and sister; Stroke in her father and mother.    ROS:  Please see the history of present illness.   Otherwise, review of systems are positive for constipation.   All other systems are reviewed and negative.    PHYSICAL EXAM: VS:  BP 108/66   Pulse 70    Ht 5\' 6"  (1.676 m)   Wt 151 lb (68.5 kg)   SpO2 97%   BMI 24.37 kg/m  , BMI Body mass index is 24.37 kg/m. GENERAL:  Chronically ill-appearing HEENT:  Pupils equal round and reactive, fundi not visualized, oral mucosa unremarkable.   NECK:  No jugular venous distention, waveform within normal limits, carotid upstroke brisk and symmetric, no bruits.  Trach in place. LUNGS:  Clear to auscultation bilaterally HEART:  Irregularly irregular.  PMI not displaced or sustained,S1 and S2 within normal limits, no S3, no S4, no clicks, no rubs, II/VI systolic murmur at LLSB CHEST: L chest wall tender to palpation ABD:  Flat, positive bowel sounds normal in frequency in pitch, no bruits, no rebound, no guarding, no midline pulsatile mass, no hepatomegaly, no splenomegaly EXT:  2 plus pulses throughout, no edema, no cyanosis no clubbing SKIN:  No rashes no nodules NEURO:  Cranial nerves II through XII grossly intact, motor grossly intact throughout PSYCH:  Cognitively intact, oriented to person place and time   EKG:  EKG is not ordered today. The ekg ordered 01/25/16 demonstrates ventricularly paced at 70 bpm. Likely atrial fibrillation.  Echo 05/09/19: IMPRESSIONS    1. The left ventricle has hyperdynamic systolic function, with an ejection fraction of >65%. The cavity size was normal. There is severely increased left ventricular wall thickness. Findings are consistent with hypertrophic cardiomyopathy. Left  ventricular diastolic Doppler parameters are consistent with pseudonormalization.  2. The  right ventricle has normal systolic function. The cavity was normal. There is no increase in right ventricular wall thickness.  3. Left atrial size was moderately dilated.  4. Right atrial size was moderately dilated.  5. Mitral valve regurgitation is moderate by color flow Doppler.  6. The aortic valve is tricuspid. Mild thickening of the aortic valve. Mild calcification of the aortic valve.  Echo  11/26/15: Study Conclusions  - Left ventricle: The cavity size was normal. There was severe concentric hypertrophy. Systolic function was vigorous. The estimated ejection fraction was in the range of 65% to 70%. Wall motion was normal; there were no regional wall motion abnormalities. Features are consistent with a pseudonormal left ventricular filling pattern, with concomitant abnormal relaxation and increased filling pressure (grade 2 diastolic dysfunction). - Aortic valve: Trileaflet; normal thickness, mildly calcified leaflets. - Mitral valve: There was trivial regurgitation. - Left atrium: The atrium was mildly dilated. - Atrial septum: There was increased thickness of the septum, consistent with lipomatous hypertrophy. - Tricuspid valve: There was trivial regurgitation.  ICD Interrogation 01/25/16:  St. Just single chamber pacemaker Battery 2.78 V Presenting rhythm is atrial fibrillation Intermittently paced at 70 bpm. Underlying rhythm atrial fibrillation with complete heart block Threshold: 1V @ 0.49ms Sensing: not performed due to complete heart block. Impedance: 436 Ohms  No events noted   Recent Labs: 05/08/2019: ALT 10 05/09/2019: Magnesium 1.8 12/13/2019: B Natriuretic Peptide 347.3; BUN 15; Creatinine, Ser 1.50; Hemoglobin 10.9; Platelets 230; Potassium 4.4; Sodium 138    Lipid Panel    Component Value Date/Time   CHOL 136 05/09/2019 0402   CHOL 168 07/16/2017 1555   TRIG 99 05/09/2019 0402   HDL 60 05/09/2019 0402   HDL 48 07/16/2017 1555   CHOLHDL 2.3 05/09/2019 0402   VLDL 20 05/09/2019 0402   LDLCALC 56 05/09/2019 0402   LDLCALC 99 07/16/2017 1555   LDLDIRECT 161.8 02/21/2013 1416      Wt Readings from Last 3 Encounters:  12/14/19 151 lb (68.5 kg)  09/30/19 171 lb (77.6 kg)  09/01/19 163 lb (73.9 kg)      ASSESSMENT AND PLAN:  #HCM:  # Hypertension:  Blood pressure is well-controlled.  Exertional dyspnea and palpitations  improved with metoprolol.    # Hyperlipidemia: Continue rosuvastatin.  LDL 56 on 04/2019.  # Chronic atrial fibrillation, PPM, CHB:  Managed by Dr. Rayann Heman. She has a history of GI bleed and AVMs. However she is on Eliquis due to stroke She is not a candidate for a Watchman due to complications of the screening TEE. This patients CHA2DS2-VASc Score and unadjusted Ischemic Stroke Rate (% per year) is equal to 9.7 % stroke rate/year from a score of 6  Above score calculated as 1 point each if present [CHF, HTN, DM, Vascular=MI/PAD/Aortic Plaque, Age if 65-74, or Female] Above score calculated as 2 points each if present [Age > 75, or Stroke/TIA/TE]  # Chronic diastolic heart failure,  HCM, demand ischemia:  [name] is euvolemic on exam today but has exertional dyspnea.  Continue lasix and add metoprolol as above.  She can take an extra lasix 20mg  daily as needed for edema or shortness of breath.  Current medicines are reviewed at length with the patient today.  The patient does not have concerns regarding medicines.  The following changes have been made:  none  Labs/ tests ordered today include:   No orders of the defined types were placed in this encounter.   Disposition:   FU with  Loula Marcella C. Oval Linsey, MD, [facility] in 3 months   Signed, Bobbiejo Ishikawa C. Oval Linsey, MD, [facility]  12/14/2019 3:08 PM    [facility]

## 2019-12-14 NOTE — Patient Instructions (Addendum)
Medication Instructions:  Lasix (furosemide) 40mg  take 1 tablet twice a day  If needed for weight gain of 2lbs in a day or 5lbs in a week may take Lasix (furosemide) 20mg  once a day  Both prescriptions sent into the pharmacy  *If you need a refill on your cardiac medications before your next appointment, please call your pharmacy*  Lab Work: NONE NEEDED   Testing/Procedures: NONE NEEDED  Follow-Up: At [facility], you and your health needs are our priority.  As part of our continuing mission to provide you with exceptional heart care, we have created designated Provider Care Teams.  These Care Teams include your primary Cardiologist (physician) and Advanced Practice Providers (APPs -  Physician Assistants and Nurse Practitioners) who all work together to provide you with the care you need, when you need it.  Your next appointment:   3 month(s)  The format for your next appointment:   In Person  Provider:   Skeet Latch, MD

## 2019-12-30 ENCOUNTER — Encounter: Payer: Self-pay | Admitting: Family Medicine

## 2020-01-06 ENCOUNTER — Encounter: Payer: Self-pay | Admitting: Cardiovascular Disease

## 2020-01-12 ENCOUNTER — Encounter: Payer: Self-pay | Admitting: Neurology

## 2020-01-15 NOTE — Progress Notes (Signed)
Virtual Visit via Video Note The purpose of this virtual visit is to provide medical care while limiting exposure to the novel coronavirus.    Consent was obtained for video visit:  Yes Answered questions that patient had about telehealth interaction:  Yes I discussed the limitations, risks, security and privacy concerns of performing an evaluation and management service by telemedicine. I also discussed with the patient that there may be a patient responsible charge related to this service. The patient expressed understanding and agreed to proceed.  Pt location: Home Physician Location: office Name of referring provider:  [name], [name], DO I connected with [name] at patients initiation/request on 01/16/2020 at  3:30 PM EST by video enabled telemedicine application and verified that I am speaking with the correct person using two identifiers. Pt MRN:  [id number] Pt DOB:  [birth year] Video Participants:  [name]; her daughter.   History of Present Illness:  [name] is a 74 year oldright-handed black woman withlaryngeal stenosis, CHF, complete heart blockand a fibstatus post PPM 1998,chronic atypical chest pain,hypertension, hyperlipidemia, Neri artery disease, depression, history of alcohol and tobacco abuse who follows up for stroke presenting as expressive aphasia.   UPDATE: Current medications:  Eliquis; Crestor 20mg ; Lasix   She still gets some speech therapy [facility].  Overall, speech is unchanged.    HISTORY: On 03/11/19, she developed both word-finding difficulty as well as difficulty getting words out. She was able to understand her daughter talking to her. This was associated with headache, dizziness, shortness of breath and diffuse body pain. She did not have visual disturbance or unilateral numbness and weakness. By the next day, she continued to have speech difficulty but other symptoms resolved. She presented  to the EDfor further evaluation. CT head was personally reviewed and showed atrophy and small vessel ischemic changes but no acute ischemic or hemorrhagic stroke. She has known atrial fibrillation with PPM who is followed by cardiology but not a candidate for anticoagulation due to prior GI bleed secondary to AVM of stomach and not a candidate for watchman.She was evaluated by neurology who increased ASA from 81mg  to 325mg  daily.Speech has improved but she still has trouble getting words out.  CTA of head and neck from 03/17/19 showed no emergent initracranial or extracranial large vessel occlusion or significant stenosis.  She was admitted to the hospital on 05/08/19 for shortness of breath and worsening aphasia.  CT of head showed subacute left parietal infarct.  She was restarted on Eliquis.  Echocardiogram showed EF >65% with no evidence of atrial shunt.   Past Medical History: Past Medical History:  Diagnosis Date  . Anemia   . Angina   . Angioedema    2/2 ACE  . Arteriovenous malformation of stomach   . Arthritis   . Asthma   . AVM (arteriovenous malformation) of colon    small intestine; stomach  . Blood transfusion   . CHF (congestive heart failure) ([place])   . Complete heart block ([place])    s/p PPM 1998  . Complication of anesthesia    Difficult airway; anaphylaxis and swelling with propofol  . DDD (degenerative disc disease)   . Depression   . Diastolic heart failure   . Fatty liver 07/26/10  . GERD (gastroesophageal reflux disease)   . GI bleed   . Headache   . History of alcohol abuse Stopped Fall 2012  . History of tobacco use Quit Fall 2012  . Hx of cardiovascular stress test  a. Lexiscan Myoview (10/15):  Small inferolateral and apical defect c/w scar and poss soft tissue attenuation, no ischemia, EF 42%  . Hx of colonic polyp 08/13/10   adenomatous  . Hx of colonoscopy   . Hyperlipidemia   . Hyperlipidemia   . Hypertension   . Hypertrophic cardiomyopathy  ([place])    dx by Dr [name] 2009  . Iron deficiency anemia   . Myocardial infarction ([place])   . Panic attack   . Panic attacks   . Permanent atrial fibrillation ([place])   . Pneumonia   . Renal failure    baseline creatinine 1.6  . Right arm pain 01/08/2012  . RLS (restless legs syndrome)    Dx 06/2007  . Shortness of breath    sob on exertation  . Sleep apnea     Medications: Outpatient Encounter Medications as of 01/16/2020  Medication Sig Note  . acetaminophen (TYLENOL) 650 MG CR tablet Take 650-1,300 mg by mouth every 8 (eight) hours as needed for pain.   [name] albuterol (PROVENTIL HFA;VENTOLIN HFA) 108 (90 Base) MCG/ACT inhaler Inhale 1-2 puffs into the lungs every 6 (six) hours as needed for wheezing or shortness of breath.   . allopurinol (ZYLOPRIM) 100 MG tablet TAKE 2 TABLETS(200 MG) BY MOUTH DAILY (Patient taking differently: Take 200 mg by mouth daily. )   . ALPRAZolam (XANAX) 0.5 MG tablet Take 1 tablet (0.5 mg total) by mouth at bedtime as needed for anxiety.   [name] apixaban (ELIQUIS) 5 MG TABS tablet Take 1 tablet (5 mg total) by mouth 2 (two) times daily.   . bimatoprost (LUMIGAN) 0.01 % SOLN Place 1 drop into both eyes at bedtime.   . cetirizine (ZYRTEC) 10 MG tablet Take 10 mg by mouth daily. prn   . Cholecalciferol (VITAMIN D) 50 MCG (2000 UT) tablet Take 2,000 Units by mouth daily.   [name] donepezil (ARICEPT ODT) 10 MG disintegrating tablet Take 10 mg by mouth at bedtime. Take with 5 mg to equal a total dose of 15 mg   . donepezil (ARICEPT ODT) 5 MG disintegrating tablet Take 5 mg by mouth at bedtime. Pt takes with 10 mg to equal a total dose of 15 mg   . Epoetin Alfa-epbx (RETACRIT IJ) Inject as directed. 12/13/2019: Unknown for dose of injection   . ferrous sulfate 325 (65 FE) MG tablet TAKE 1 TABLET BY MOUTH TWICE DAILY WITH MEALS TO KEEP BLOOD COUNT UP (Patient taking differently: Take 325 mg by mouth 2 (two) times daily with a meal. )   . fexofenadine (ALLEGRA) 180 MG tablet Take  180 mg by mouth daily.   . furosemide (LASIX) 20 MG tablet Take 1 tablet (20 mg total) by mouth daily. Daily as needed for weight gain of 2lbs in a day or 5lbs in a week   . furosemide (LASIX) 40 MG tablet Take 1 tablet (40 mg total) by mouth 2 (two) times daily.   [name] guaiFENesin (MUCINEX) 600 MG 12 hr tablet Take 600 mg by mouth 2 (two) times daily as needed.   [name] guaiFENesin (MUCINEX) 600 MG 12 hr tablet Take 1 tablet (600 mg total) by mouth 2 (two) times daily as needed for to loosen phlegm.   [name] ipratropium-albuterol (DUONEB) 0.5-2.5 (3) MG/3ML SOLN USE 3 ML VIA NEBULIZER EVERY 6 HOURS (Patient taking differently: Take 3 mLs by nebulization every 6 (six) hours as needed (shortness of breath). )   . loratadine (CLARITIN) 10 MG tablet Take 1 tablet (10 mg  total) by mouth daily. One po daily x 5 days   . magnesium hydroxide (MILK OF MAGNESIA) 400 MG/5ML suspension Take 60 mLs by mouth daily as needed for mild constipation. prn   . metoprolol tartrate (LOPRESSOR) 25 MG tablet Take 1 tablet (25 mg total) by mouth 2 (two) times daily.   . nitroGLYCERIN (NITROSTAT) 0.4 MG SL tablet Place 1 tablet (0.4 mg total) under the tongue every 5 (five) minutes as needed for chest pain. (Patient taking differently: Place 0.4 mg under the tongue every 5 (five) minutes as needed for chest pain. prn)   . Olopatadine HCl 0.2 % SOLN Place 1-2 drops into both eyes daily as needed (itching).    [name] oxymetazoline (AFRIN NASAL SPRAY) 0.05 % nasal spray Place 1 spray into both nostrils 2 (two) times daily.   . polyethylene glycol (MIRALAX / GLYCOLAX) packet Take 17 g by mouth daily as needed for mild constipation.   . potassium chloride (K-DUR) 10 MEQ tablet Take 1 tablet (10 mEq total) by mouth daily.   . predniSONE (DELTASONE) 1 MG tablet Take 4 mg by mouth daily with breakfast. Patient started on 12-06-19 for 4 week therapy.   . rosuvastatin (CRESTOR) 20 MG tablet Take 1 tablet (20 mg total) by mouth at bedtime.   . sodium  chloride (OCEAN) 0.65 % SOLN nasal spray Place 1 spray into both nostrils daily as needed for congestion.    . triamcinolone cream (KENALOG) 0.1 % Apply 1 application topically 2 (two) times daily. (Patient taking differently: Apply 1 application topically 2 (two) times daily as needed (rash). )    No facility-administered encounter medications on file as of 01/16/2020.    Allergies: Allergies  Allergen Reactions  . Ace Inhibitors Swelling    angiodema  . Other Anaphylaxis and Swelling    Reaction to unspecified anesthesia     Family History: Family History  Problem Relation Age of Onset  . Hypertension Mother   . Stroke Mother   . Heart disease Mother   . Aneurysm Mother   . CVA Mother   . Hypertension Father   . Stroke Father   . Heart attack Father   . Alcohol abuse Father   . Hypertension Sister   . Hypertension Brother   . Colon cancer Brother   . Cancer Brother   . Diabetes Sister   . Diabetes Brother   . Heart attack Brother   . Heart attack Brother   . Heart disease Sister   . Alcohol abuse Brother   . Heart disease Daughter     Social History: Social History   Socioeconomic History  . Marital status: Divorced    Spouse name: Not on file  . Number of children: 4  . Years of education: 9  . Highest education level: Not on file  Occupational History  . Occupation: disabled    Fish farm manager: UNEMPLOYED  . Occupation: previously- Development worker, community  Tobacco Use  . Smoking status: Former Smoker    Packs/day: 0.10    Years: 50.00    Pack years: 5.00    Types: Cigarettes    Quit date: 08/16/2011    Years since quitting: 8.4  . Smokeless tobacco: Never Used  . Tobacco comment: quit 2012  Substance and Sexual Activity  . Alcohol use: No    Comment: quit 08/16/2011  . Drug use: No  . Sexual activity: Never  Other Topics Concern  . Not on file  Social History Narrative   On disability  for heart failure/pacemaker.     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    4 grown children,  8 grandchildren.     Drinks alcohol a few times a week socially. At one time, the most she reports drinking is about 3 mixed drinks.   Lives with daughter [name] and son. Will be moving 04/2011 to different house because concerned children are taking advantage of her financial situation.    No illicit drugs.   Smokes few cigarettes daily, especially when stressed. Started smoking @ age 15. Quit January 2012 2/2 health but started smoking again 02/2011.       Health Care POA:    Emergency Contact: daughter, Verdene Lennert [phone]   End of Life Plan: gave pt AD info pamphlet   Who lives with you: no one- lives in section 8 housing   Any pets: none   Diet: Pt has a varied diet of protein, starch and vegetables   Exercise: Pt has no regular exercise routine.   Seatbelts: Pt reports wearing seatbelt when in vehicles.    Hobbies: dancing   Social Determinants of Health   Financial Resource Strain:   . Difficulty of Paying Living Expenses: Not on file  Food Insecurity:   . Worried About Charity fundraiser in the Last Year: Not on file  . Ran Out of Food in the Last Year: Not on file  Transportation Needs:   . Lack of Transportation (Medical): Not on file  . Lack of Transportation (Non-Medical): Not on file  Physical Activity:   . Days of Exercise per Week: Not on file  . Minutes of Exercise per Session: Not on file  Stress:   . Feeling of Stress : Not on file  Social Connections:   . Frequency of Communication with Friends and Family: Not on file  . Frequency of Social Gatherings with Friends and Family: Not on file  . Attends Religious Services: Not on file  . Active Member of Clubs or Organizations: Not on file  . Attends Archivist Meetings: Not on file  . Marital Status: Not on file  Intimate Partner Violence:   . Fear of Current or Ex-Partner: Not on file  . Emotionally Abused: Not on file  . Physically Abused: Not on file  .  Sexually Abused: Not on file    Observations/Objective:   Height 5\' 4"  (1.626 m), weight 165 lb (74.8 kg). No acute distress.  Alert and oriented.  Speech dysfluent.  Assessment and Plan:   1.  Aphasia as late effect of left parietal infarct, likely embolic secondary to atrial fibrillation. 2.  Atrial fibrillation 3.  Hypertension 4.  Hyperlipidemia  Secondary stroke prevention as per PCP and cardiology. 1.  Eliquis for secondary stroke prevention 2.  Crestor (LDL goal less than 70) 3.  Blood pressure management 4.  Mediterranean diet 5.  Follow up in 6 months.  Follow Up Instructions:    -I discussed the assessment and treatment plan with the patient. The patient was provided an opportunity to ask questions and all were answered. The patient agreed with the plan and demonstrated an understanding of the instructions.   The patient was advised to call back or seek an in-person evaluation if the symptoms worsen or if the condition fails to improve as anticipated.    Total Time spent in visit with the patient was:  14 minutes    Dudley Major, DO

## 2020-01-16 ENCOUNTER — Encounter: Payer: Self-pay | Admitting: Neurology

## 2020-01-16 ENCOUNTER — Other Ambulatory Visit: Payer: Self-pay

## 2020-01-16 ENCOUNTER — Telehealth (INDEPENDENT_AMBULATORY_CARE_PROVIDER_SITE_OTHER): Payer: Medicare (Managed Care) | Admitting: Neurology

## 2020-01-16 VITALS — Ht 64.0 in | Wt 165.0 lb

## 2020-01-16 DIAGNOSIS — I4821 Permanent atrial fibrillation: Secondary | ICD-10-CM | POA: Diagnosis not present

## 2020-01-16 DIAGNOSIS — E7800 Pure hypercholesterolemia, unspecified: Secondary | ICD-10-CM

## 2020-01-16 DIAGNOSIS — I63412 Cerebral infarction due to embolism of left middle cerebral artery: Secondary | ICD-10-CM | POA: Diagnosis not present

## 2020-01-16 DIAGNOSIS — I10 Essential (primary) hypertension: Secondary | ICD-10-CM

## 2020-01-16 DIAGNOSIS — I69320 Aphasia following cerebral infarction: Secondary | ICD-10-CM | POA: Diagnosis not present

## 2020-01-20 ENCOUNTER — Emergency Department (HOSPITAL_COMMUNITY): Payer: Medicare (Managed Care)

## 2020-01-20 ENCOUNTER — Emergency Department (HOSPITAL_COMMUNITY)
Admission: EM | Admit: 2020-01-20 | Discharge: 2020-01-20 | Disposition: A | Discharge status: Home / Self Care | Payer: Medicare (Managed Care) | Attending: Emergency Medicine | Admitting: Emergency Medicine

## 2020-01-20 ENCOUNTER — Other Ambulatory Visit: Payer: Self-pay

## 2020-01-20 ENCOUNTER — Encounter (HOSPITAL_COMMUNITY): Payer: Self-pay | Admitting: *Deleted

## 2020-01-20 DIAGNOSIS — R609 Edema, unspecified: Secondary | ICD-10-CM | POA: Insufficient documentation

## 2020-01-20 DIAGNOSIS — R0609 Other forms of dyspnea: Secondary | ICD-10-CM | POA: Diagnosis present

## 2020-01-20 DIAGNOSIS — J441 Chronic obstructive pulmonary disease with (acute) exacerbation: Secondary | ICD-10-CM | POA: Diagnosis not present

## 2020-01-20 DIAGNOSIS — Z930 Tracheostomy status: Secondary | ICD-10-CM | POA: Diagnosis not present

## 2020-01-20 DIAGNOSIS — R062 Wheezing: Secondary | ICD-10-CM | POA: Diagnosis not present

## 2020-01-20 LAB — BASIC METABOLIC PANEL
Anion gap: 12 (ref 5–15)
BUN: 21 mg/dL (ref 8–23)
CO2: 27 mmol/L (ref 22–32)
Calcium: 10.1 mg/dL (ref 8.9–10.3)
Chloride: 103 mmol/L (ref 98–111)
Creatinine, Ser: 1.49 mg/dL — ABNORMAL HIGH (ref 0.44–1.00)
GFR calc Af Amer: 39 mL/min — ABNORMAL LOW (ref >60)
GFR calc non Af Amer: 34 mL/min — ABNORMAL LOW (ref >60)
Glucose, Bld: 156 mg/dL — ABNORMAL HIGH (ref 70–99)
Potassium: 3.9 mmol/L (ref 3.5–5.1)
Sodium: 142 mmol/L (ref 135–145)

## 2020-01-20 LAB — CBC WITH DIFFERENTIAL/PLATELET
Abs Immature Granulocytes: 0.02 10*3/uL (ref 0.00–0.07)
Basophils Absolute: 0 10*3/uL (ref 0.0–0.1)
Basophils Relative: 1 %
Eosinophils Absolute: 0 10*3/uL (ref 0.0–0.5)
Eosinophils Relative: 0 %
HCT: 37 % (ref 36.0–46.0)
Hemoglobin: 10.6 g/dL — ABNORMAL LOW (ref 12.0–15.0)
Immature Granulocytes: 0 %
Lymphocytes Relative: 18 %
Lymphs Abs: 0.9 10*3/uL (ref 0.7–4.0)
MCH: 26.4 pg (ref 26.0–34.0)
MCHC: 28.6 g/dL — ABNORMAL LOW (ref 30.0–36.0)
MCV: 92 fL (ref 80.0–100.0)
Monocytes Absolute: 0.5 10*3/uL (ref 0.1–1.0)
Monocytes Relative: 10 %
Neutro Abs: 3.4 10*3/uL (ref 1.7–7.7)
Neutrophils Relative %: 71 %
Platelets: 356 10*3/uL (ref 150–400)
RBC: 4.02 MIL/uL (ref 3.87–5.11)
RDW: 19.9 % — ABNORMAL HIGH (ref 11.5–15.5)
WBC: 4.8 10*3/uL (ref 4.0–10.5)
nRBC: 0.4 % — ABNORMAL HIGH (ref 0.0–0.2)

## 2020-01-20 LAB — BRAIN NATRIURETIC PEPTIDE: B Natriuretic Peptide: 820.9 pg/mL — ABNORMAL HIGH (ref 0.0–100.0)

## 2020-01-20 MED ORDER — ALBUTEROL SULFATE HFA 108 (90 BASE) MCG/ACT IN AERS
4.0000 | INHALATION_SPRAY | Freq: Once | RESPIRATORY_TRACT | Status: AC
  Administered 2020-01-20: 4 via RESPIRATORY_TRACT
  Filled 2020-01-20: qty 6.7

## 2020-01-20 MED ORDER — PREDNISONE 20 MG PO TABS: ORAL_TABLET | ORAL | 0 refills | Status: DC

## 2020-01-20 MED ORDER — PREDNISONE 20 MG PO TABS
60.0000 mg | ORAL_TABLET | Freq: Once | ORAL | Status: AC
  Administered 2020-01-20: 60 mg via ORAL
  Filled 2020-01-20: qty 3

## 2020-01-20 NOTE — ED Provider Notes (Signed)
[facility] EMERGENCY DEPARTMENT Provider Note   CSN: [id number] Arrival date & time: 01/20/20  1246     History Chief Complaint  Patient presents with  . Tracheostomy Tube Change    [name] is a 74 y.o. female.  HPI 74 year old female presents with dyspnea. Patient has been having dyspnea for about 4 weeks. Includes when she was seen by Dr. Erik Obey in the office with a trach change.  Has been having yellow, brown and occasionally bloody sputum.  No fevers.  She states she has been using home treatments for her dyspnea.  No chest pain.  Past Medical History:  Diagnosis Date  . Anemia   . Angina   . Angioedema    2/2 ACE  . Arteriovenous malformation of stomach   . Arthritis   . Asthma   . AVM (arteriovenous malformation) of colon    small intestine; stomach  . Blood transfusion   . CHF (congestive heart failure) ([place])   . Complete heart block ([place])    s/p PPM 1998  . Complication of anesthesia    Difficult airway; anaphylaxis and swelling with propofol  . DDD (degenerative disc disease)   . Depression   . Diastolic heart failure   . Fatty liver 07/26/10  . GERD (gastroesophageal reflux disease)   . GI bleed   . Headache   . History of alcohol abuse Stopped Fall 2012  . History of tobacco use Quit Fall 2012  . Hx of cardiovascular stress test    a. Lexiscan Myoview (10/15):  Small inferolateral and apical defect c/w scar and poss soft tissue attenuation, no ischemia, EF 42%  . Hx of colonic polyp 08/13/10   adenomatous  . Hx of colonoscopy   . Hyperlipidemia   . Hyperlipidemia   . Hypertension   . Hypertrophic cardiomyopathy ([place])    dx by Dr [name] 2009  . Iron deficiency anemia   . Myocardial infarction ([place])   . Panic attack   . Panic attacks   . Permanent atrial fibrillation ([place])   . Pneumonia   . Renal failure    baseline creatinine 1.6  . Right arm pain 01/08/2012  . RLS (restless legs syndrome)    Dx 06/2007  .  Shortness of breath    sob on exertation  . Sleep apnea     Patient Active Problem List   Diagnosis Date Noted  . Cough with hemoptysis 08/02/2019  . SOB (shortness of breath)   . Atypical chest pain   . Chest pain on exertion 05/15/2019  . Cerebral embolism with cerebral infarction 05/09/2019  . Expressive aphasia 05/08/2019  . Other abnormal glucose 11/26/2018  . Polypharmacy 06/25/2018  . Depression with anxiety   . Tracheostomy in place [facility])   . Tracheostomy care ([place])   . Calculus of gallbladder without cholecystitis without obstruction 12/24/2017  . Chronic cough 09/25/2017  . History of colonic polyps   . Moderate protein-calorie malnutrition ([place]) 06/02/2017  . Chronic idiopathic constipation 03/19/2017  . 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 Permanent atrial fibrillation ([place])   . Laryngeal stenosis 09/18/2016  . Esophageal dysphagia   . Polymyalgia rheumatica ([place]) 04/10/2015  . Hypertrophic cardiomyopathy ([place]) 02/15/2015  . Eczema 09/18/2014  . Environmental allergies 09/18/2014  . Pre-diabetes 11/26/2013  . COPD, moderate ([place]) 11/01/2013  . Complete heart block ([place]) 04/03/2011  . Anemia 02/17/2011  . AVM (arteriovenous malformation) 02/13/2011  . PACEMAKER-[facility] 11/28/2010  . CKD (chronic kidney disease) stage 3,  GFR 30-59 ml/min (H[facility]) 09/24/2010  . Acute on chronic congestive heart failure ([place]) 08/30/2010  . ARTHRITIS 07/24/2010  . Generalized anxiety disorder 07/23/2010  . Hyperlipidemia 02/11/2007  . Essential hypertension 02/11/2007  . APNEA, SLEEP 02/11/2007    Past Surgical History:  Procedure Laterality Date  . CARDIAC CATHETERIZATION    . CARDIAC CATHETERIZATION N/A 08/19/2016   Procedure: Right/Left Heart Cath and Coronary Angiography;  Surgeon: Jolaine Artist, MD;  Location: [facility] CV LAB;  Service: Cardiovascular;  Laterality: N/A;  . COLONOSCOPY WITH PROPOFOL N/A 06/16/2017   Procedure: COLONOSCOPY WITH PROPOFOL;  Surgeon: Mauri Pole, MD;  Location: [facility] ENDOSCOPY;  Service: Endoscopy;  Laterality: N/A;  . DIRECT LARYNGOSCOPY N/A 09/18/2016   Procedure: DIRECT LARYNGOSCOPY, BRONCHOSCOPY, REMOVAL OF INTUBATION GRANULOMA;  Surgeon: [name], MD;  Location: [facility] OR;  Service: ENT;  Laterality: N/A;  . DIRECT LARYNGOSCOPY N/A 10/20/2016   Procedure: EXTUBATION AND FLEXIBLE LARYNGOSCOPE;  Surgeon: [name], MD;  Location: [facility];  Service: ENT;  Laterality: N/A;  . DIRECT LARYNGOSCOPY N/A 10/29/2016   Procedure: DIRECT LARYNGOSCOPY;  Surgeon: [name], MD;  Location: [facility] OR;  Service: ENT;  Laterality: N/A;  . ESOPHAGOGASTRODUODENOSCOPY  12/23/2011   Procedure: ESOPHAGOGASTRODUODENOSCOPY (EGD);  Surgeon: [name], MD;  Location: [facility];  Service: Endoscopy;  Laterality: N/A;  . ESOPHAGOGASTRODUODENOSCOPY (EGD) WITH PROPOFOL N/A 06/16/2017   Procedure: ESOPHAGOGASTRODUODENOSCOPY (EGD) WITH PROPOFOL;  Surgeon: Mauri Pole, MD;  Location: [facility];  Service: Endoscopy;  Laterality: N/A;  . EXTUBATION (ENDOTRACHEAL) IN OR N/A 07/21/2016   Procedure: EXTUBATION (ENDOTRACHEAL) IN OR;  Surgeon: [name], MD;  Location: [facility];  Service: ENT;  Laterality: N/A;  . GIVENS CAPSULE STUDY  12/23/2011   Procedure: GIVENS CAPSULE STUDY;  Surgeon: [name], MD;  Location: [facility];  Service: Endoscopy;  Laterality: N/A;  . MICROLARYNGOSCOPY WITH LASER N/A 10/16/2016   Procedure: MICRODIRECTLARYNGOSCOPY WITH LASER ABLATION AND KENLOG INJECTION;  Surgeon: [name], MD;  Location: [facility];  Service: ENT;  Laterality: N/A;  . PACEMAKER INSERTION  1998   st jude, most recent gen change by [name] 4/12  . TRACHEOSTOMY TUBE PLACEMENT N/A 10/29/2016   Procedure: TRACHEOSTOMY;  Surgeon: [name], MD;  Location: [facility];  Service: ENT;  Laterality: N/A;  . TUBAL LIGATION  04/01/2000     OB History   No obstetric history on file.     Family History  Problem Relation Age of Onset  . Hypertension Mother    . Stroke Mother   . Heart disease Mother   . Aneurysm Mother   . CVA Mother   . Hypertension Father   . Stroke Father   . Heart attack Father   . Alcohol abuse Father   . Hypertension Sister   . Hypertension Brother   . Colon cancer Brother   . Cancer Brother   . Diabetes Sister   . Diabetes Brother   . Heart attack Brother   . Heart attack Brother   . Heart disease Sister   . Alcohol abuse Brother   . Heart disease Daughter     Social History   Tobacco Use  . Smoking status: Former Smoker    Packs/day: 0.10    Years: 50.00    Pack years: 5.00    Types: Cigarettes    Quit date: 08/16/2011    Years since quitting: 8.4  . Smokeless tobacco: Never Used  . Tobacco comment: quit 2012  Substance Use Topics  .  Alcohol use: No    Comment: quit 08/16/2011  . Drug use: No    Home Medications Prior to Admission medications   Medication Sig Start Date End Date Taking? Authorizing Provider  acetaminophen (TYLENOL) 650 MG CR tablet Take 650-1,300 mg by mouth every 8 (eight) hours as needed for pain.    [provider]  albuterol (PROVENTIL HFA;VENTOLIN HFA) 108 (90 Base) MCG/ACT inhaler Inhale 1-2 puffs into the lungs every 6 (six) hours as needed for wheezing or shortness of breath. 08/04/18   Riccio, Gardiner Rhyme, DO  allopurinol (ZYLOPRIM) 100 MG tablet TAKE 2 TABLETS(200 MG) BY MOUTH DAILY Patient taking differently: Take 200 mg by mouth daily.  05/18/19   Steve Rattler, DO  ALPRAZolam (XANAX) 0.5 MG tablet Take 1 tablet (0.5 mg total) by mouth at bedtime as needed for anxiety. 08/15/19   [name], [name], DO  apixaban (ELIQUIS) 5 MG TABS tablet Take 1 tablet (5 mg total) by mouth 2 (two) times daily. 06/06/19   Skeet Latch, MD  bimatoprost (LUMIGAN) 0.01 % SOLN Place 1 drop into both eyes at bedtime.    [provider]  cetirizine (ZYRTEC) 10 MG tablet Take 10 mg by mouth daily. prn    [provider]  Cholecalciferol (VITAMIN D) 50 MCG (2000 UT)  tablet Take 2,000 Units by mouth daily.    [provider]  donepezil (ARICEPT ODT) 10 MG disintegrating tablet Take 10 mg by mouth at bedtime. Take with 5 mg to equal a total dose of 15 mg    [provider]  donepezil (ARICEPT ODT) 5 MG disintegrating tablet Take 5 mg by mouth at bedtime. Pt takes with 10 mg to equal a total dose of 15 mg    [provider]  Epoetin Alfa-epbx (RETACRIT IJ) Inject as directed.    [provider]  ferrous sulfate 325 (65 FE) MG tablet TAKE 1 TABLET BY MOUTH TWICE DAILY WITH MEALS TO KEEP BLOOD COUNT UP Patient taking differently: Take 325 mg by mouth 2 (two) times daily with a meal.  08/26/16   Riccio, Gardiner Rhyme, DO  fexofenadine (ALLEGRA) 180 MG tablet Take 180 mg by mouth daily.    [provider]  furosemide (LASIX) 20 MG tablet Take 1 tablet (20 mg total) by mouth daily. Daily as needed for weight gain of 2lbs in a day or 5lbs in a week 12/14/19   Skeet Latch, MD  furosemide (LASIX) 40 MG tablet Take 1 tablet (40 mg total) by mouth 2 (two) times daily. 12/14/19   Skeet Latch, MD  guaiFENesin (MUCINEX) 600 MG 12 hr tablet Take 600 mg by mouth 2 (two) times daily as needed.    [provider]  guaiFENesin (MUCINEX) 600 MG 12 hr tablet Take 1 tablet (600 mg total) by mouth 2 (two) times daily as needed for to loosen phlegm. 12/13/19   Charlesetta Shanks, MD  ipratropium-albuterol (DUONEB) 0.5-2.5 (3) MG/3ML SOLN USE 3 ML VIA NEBULIZER EVERY 6 HOURS Patient taking differently: Take 3 mLs by nebulization every 6 (six) hours as needed (shortness of breath).  10/21/18   Steve Rattler, DO  loratadine (CLARITIN) 10 MG tablet Take 1 tablet (10 mg total) by mouth daily. One po daily x 5 days 12/13/19   Charlesetta Shanks, MD  magnesium hydroxide (MILK OF MAGNESIA) 400 MG/5ML suspension Take 60 mLs by mouth daily as needed for mild constipation. prn    [provider]  metoprolol tartrate (LOPRESSOR) 25 MG  tablet Take 1 tablet (25 mg total) by mouth 2 (two) times daily. 09/30/19   Skeet Latch, MD  nitroGLYCERIN (NITROSTAT) 0.4 MG SL tablet Place 1 tablet (0.4 mg total) under the tongue every 5 (five) minutes as needed for chest pain. Patient taking differently: Place 0.4 mg under the tongue every 5 (five) minutes as needed for chest pain. prn 08/04/18   Steve Rattler, DO  Olopatadine HCl 0.2 % SOLN Place 1-2 drops into both eyes daily as needed (itching).  02/17/18   [provider]  oxymetazoline (AFRIN NASAL SPRAY) 0.05 % nasal spray Place 1 spray into both nostrils 2 (two) times daily. 05/21/19   Hall-Potvin, [place], PA-C  polyethylene glycol (MIRALAX / GLYCOLAX) packet Take 17 g by mouth daily as needed for mild constipation. 11/26/18   Steve Rattler, DO  potassium chloride (K-DUR) 10 MEQ tablet Take 1 tablet (10 mEq total) by mouth daily. 08/19/19   [name], [name], DO  predniSONE (DELTASONE) 1 MG tablet Take 4 mg by mouth daily with breakfast. Patient started on 12-06-19 for 4 week therapy. 08/10/19   [provider]  predniSONE (DELTASONE) 20 MG tablet 2 tabs po daily x 4 days 01/21/20   Sherwood Gambler, MD  rosuvastatin (CRESTOR) 20 MG tablet Take 1 tablet (20 mg total) by mouth at bedtime. 08/19/19   [name], [name], DO  sodium chloride (OCEAN) 0.65 % SOLN nasal spray Place 1 spray into both nostrils daily as needed for congestion.     [provider]  triamcinolone cream (KENALOG) 0.1 % Apply 1 application topically 2 (two) times daily. Patient taking differently: Apply 1 application topically 2 (two) times daily as needed (rash).  06/03/18   Zenia Resides, MD    Allergies    Ace inhibitors and Other  Review of Systems   Review of Systems  Constitutional: Negative for fever.  Respiratory: Positive for cough and shortness of breath.   Cardiovascular: Negative for chest pain.  All other systems reviewed and are negative.   Physical Exam Updated Vital  Signs BP 139/89   Pulse 71   Temp 98.3 F (36.8 C) (Oral)   Resp (!) 21   Ht 5\' 6"  (1.676 m)   Wt 77.1 kg   SpO2 95%   BMI 27.44 kg/m   Physical Exam Vitals and nursing note reviewed.  Constitutional:      General: She is not in acute distress.    Appearance: She is well-developed. She is not ill-appearing or diaphoretic.  HENT:     Head: Normocephalic and atraumatic.     Right Ear: External ear normal.     Left Ear: External ear normal.     Nose: Nose normal.  Eyes:     General:        Right eye: No discharge.        Left eye: No discharge.  Cardiovascular:     Rate and Rhythm: Normal rate and regular rhythm.     Heart sounds: Normal heart sounds.  Pulmonary:     Effort: Pulmonary effort is normal. No tachypnea or accessory muscle usage.     Breath sounds: Wheezing present.     Comments: No increased WOB. Speaks in clear/full sentences Abdominal:     Palpations: Abdomen is soft.     Tenderness: There is no abdominal tenderness.  Skin:    General: Skin is warm and dry.  Neurological:     Mental Status: She is alert.  Psychiatric:  Mood and Affect: Mood is not anxious.     ED Results / Procedures / Treatments   Labs (all labs ordered are listed, but only abnormal results are displayed) Labs Reviewed  BASIC METABOLIC PANEL - Abnormal; Notable for the following components:      Result Value   Glucose, Bld 156 (*)    Creatinine, Ser 1.49 (*)    GFR calc non Af Amer 34 (*)    GFR calc Af Amer 39 (*)    All other components within normal limits  BRAIN NATRIURETIC PEPTIDE - Abnormal; Notable for the following components:   B Natriuretic Peptide 820.9 (*)    All other components within normal limits  CBC WITH DIFFERENTIAL/PLATELET - Abnormal; Notable for the following components:   Hemoglobin 10.6 (*)    MCHC 28.6 (*)    RDW 19.9 (*)    nRBC 0.4 (*)    All other components within normal limits    EKG EKG Interpretation  Date/Time:  Friday January 20 2020 19:06:51 EST Ventricular Rate:  70 PR Interval:    QRS Duration: 214 QT Interval:  544 QTC Calculation: 588 R Axis:   -83 Text Interpretation: Paced Rhythm LVH with IVCD, LAD and secondary repol abnrm Prolonged QT interval no significant change since 2020 Confirmed by Sherwood Gambler [phone]) on 01/20/2020 7:09:18 PM   Radiology DG Chest 2 View  Result Date: 01/20/2020 CLINICAL DATA:  Shortness of breath EXAM: CHEST - 2 VIEW COMPARISON:  12/13/2019 FINDINGS: Cardiomegaly with right chest multi lead pacer defibrillator. Tracheostomy. Both lungs are clear. The visualized skeletal structures are unremarkable. IMPRESSION: 1.  Tracheostomy without acute abnormality of the lungs. 2.  Cardiomegaly. Electronically Signed   By: Eddie Candle M.D.   On: 01/20/2020 13:38    Procedures Procedures (including critical care time)  Medications Ordered in ED Medications  predniSONE (DELTASONE) tablet 60 mg (60 mg Oral Given 01/20/20 1943)  albuterol (VENTOLIN HFA) 108 (90 Base) MCG/ACT inhaler 4 puff (4 puffs Inhalation Given 01/20/20 1944)    ED Course  I have reviewed the triage vital signs and the nursing notes.  Pertinent labs & imaging results that were available during my care of the patient were reviewed by me and considered in my medical decision making (see chart for details).    MDM Rules/Calculators/A&P                      Patient feels better with albuterol and suctioning.  I think suctioning did the most benefit.  Vitals are okay.  Could be mild COPD/chronic respiratory problems.  Her BNP is more elevated than typical and she reports chronic leg swelling.  I will increase her Lasix for a couple days.  Potassium is okay.  Appears stable for discharge home. Final Clinical Impression(s) / ED Diagnoses Final diagnoses:  COPD exacerbation ([place])    Rx / DC Orders ED Discharge Orders         Ordered    predniSONE (DELTASONE) 20 MG tablet     01/20/20 2244           Sherwood Gambler, MD 01/20/20 2354

## 2020-01-20 NOTE — Progress Notes (Signed)
RT note: RT called to bedside to access patient trach. Patient had PMV in place and trach in good prosthion. RT suctioned patient trach thick white grey secretions.Patient tolerated well, inner canula clean at this time. Patient stated she is breathing much easier. Vital signs stable HR 70 sats 100% on room air.

## 2020-01-20 NOTE — Discharge Instructions (Addendum)
Use your albuterol inhaler 2 puffs every 4 hours as needed for cough or shortness of breath.  Start the steroid prescription tomorrow.  Increase your Lasix/furosemide from 2 times per day to 3 times per day for the next 3 days.  Follow-up with your primary care physician.

## 2020-01-20 NOTE — ED Notes (Signed)
[phone] Pts daughter [name], wants an update

## 2020-01-20 NOTE — ED Triage Notes (Signed)
States she went to Dr. Mare Loan office today and was sent to the Ed for further eval. States her trach feels like it is clogged. Able to speak in complete sentences.

## 2020-01-23 ENCOUNTER — Emergency Department (HOSPITAL_COMMUNITY)
Admission: EM | Admit: 2020-01-23 | Discharge: 2020-01-23 | Disposition: A | Discharge status: Home / Self Care | Payer: Medicare (Managed Care) | Attending: Emergency Medicine | Admitting: Emergency Medicine

## 2020-01-23 ENCOUNTER — Other Ambulatory Visit: Payer: Self-pay

## 2020-01-23 ENCOUNTER — Emergency Department (HOSPITAL_COMMUNITY): Payer: Medicare (Managed Care)

## 2020-01-23 DIAGNOSIS — J449 Chronic obstructive pulmonary disease, unspecified: Secondary | ICD-10-CM | POA: Diagnosis not present

## 2020-01-23 DIAGNOSIS — Z87891 Personal history of nicotine dependence: Secondary | ICD-10-CM | POA: Diagnosis not present

## 2020-01-23 DIAGNOSIS — Z20822 Contact with and (suspected) exposure to covid-19: Secondary | ICD-10-CM | POA: Insufficient documentation

## 2020-01-23 DIAGNOSIS — N184 Chronic kidney disease, stage 4 (severe): Secondary | ICD-10-CM | POA: Insufficient documentation

## 2020-01-23 DIAGNOSIS — I5032 Chronic diastolic (congestive) heart failure: Secondary | ICD-10-CM | POA: Insufficient documentation

## 2020-01-23 DIAGNOSIS — Z79899 Other long term (current) drug therapy: Secondary | ICD-10-CM | POA: Diagnosis not present

## 2020-01-23 DIAGNOSIS — Z950 Presence of cardiac pacemaker: Secondary | ICD-10-CM | POA: Insufficient documentation

## 2020-01-23 DIAGNOSIS — Z7901 Long term (current) use of anticoagulants: Secondary | ICD-10-CM | POA: Insufficient documentation

## 2020-01-23 DIAGNOSIS — I130 Hypertensive heart and chronic kidney disease with heart failure and stage 1 through stage 4 chronic kidney disease, or unspecified chronic kidney disease: Secondary | ICD-10-CM | POA: Diagnosis not present

## 2020-01-23 DIAGNOSIS — R0602 Shortness of breath: Secondary | ICD-10-CM | POA: Insufficient documentation

## 2020-01-23 DIAGNOSIS — J9500 Unspecified tracheostomy complication: Secondary | ICD-10-CM | POA: Insufficient documentation

## 2020-01-23 LAB — CBC WITH DIFFERENTIAL/PLATELET
Abs Immature Granulocytes: 0.02 10*3/uL (ref 0.00–0.07)
Basophils Absolute: 0 10*3/uL (ref 0.0–0.1)
Basophils Relative: 1 %
Eosinophils Absolute: 0.1 10*3/uL (ref 0.0–0.5)
Eosinophils Relative: 2 %
HCT: 35.2 % — ABNORMAL LOW (ref 36.0–46.0)
Hemoglobin: 10.2 g/dL — ABNORMAL LOW (ref 12.0–15.0)
Immature Granulocytes: 0 %
Lymphocytes Relative: 22 %
Lymphs Abs: 1 10*3/uL (ref 0.7–4.0)
MCH: 27.2 pg (ref 26.0–34.0)
MCHC: 29 g/dL — ABNORMAL LOW (ref 30.0–36.0)
MCV: 93.9 fL (ref 80.0–100.0)
Monocytes Absolute: 0.5 10*3/uL (ref 0.1–1.0)
Monocytes Relative: 12 %
Neutro Abs: 2.8 10*3/uL (ref 1.7–7.7)
Neutrophils Relative %: 63 %
Platelets: 316 10*3/uL (ref 150–400)
RBC: 3.75 MIL/uL — ABNORMAL LOW (ref 3.87–5.11)
RDW: 20.4 % — ABNORMAL HIGH (ref 11.5–15.5)
WBC: 4.5 10*3/uL (ref 4.0–10.5)
nRBC: 1.1 % — ABNORMAL HIGH (ref 0.0–0.2)

## 2020-01-23 LAB — BASIC METABOLIC PANEL
Anion gap: 14 (ref 5–15)
BUN: 30 mg/dL — ABNORMAL HIGH (ref 8–23)
CO2: 23 mmol/L (ref 22–32)
Calcium: 9.9 mg/dL (ref 8.9–10.3)
Chloride: 104 mmol/L (ref 98–111)
Creatinine, Ser: 1.73 mg/dL — ABNORMAL HIGH (ref 0.44–1.00)
GFR calc Af Amer: 33 mL/min — ABNORMAL LOW (ref >60)
GFR calc non Af Amer: 28 mL/min — ABNORMAL LOW (ref >60)
Glucose, Bld: 107 mg/dL — ABNORMAL HIGH (ref 70–99)
Potassium: 3.4 mmol/L — ABNORMAL LOW (ref 3.5–5.1)
Sodium: 141 mmol/L (ref 135–145)

## 2020-01-23 LAB — POC SARS CORONAVIRUS 2 AG -  ED: SARS Coronavirus 2 Ag: NEGATIVE

## 2020-01-23 LAB — BRAIN NATRIURETIC PEPTIDE: B Natriuretic Peptide: 623.6 pg/mL — ABNORMAL HIGH (ref 0.0–100.0)

## 2020-01-23 MED ORDER — ALBUTEROL SULFATE HFA 108 (90 BASE) MCG/ACT IN AERS
2.0000 | INHALATION_SPRAY | Freq: Once | RESPIRATORY_TRACT | Status: AC
  Administered 2020-01-23: 2 via RESPIRATORY_TRACT
  Filled 2020-01-23: qty 6.7

## 2020-01-23 MED ORDER — AZITHROMYCIN 250 MG PO TABS: 250.0000 mg | ORAL_TABLET | Freq: Every day | ORAL | 0 refills | Status: DC

## 2020-01-23 NOTE — ED Notes (Signed)
Pt ambulated with pulse ox. Pt's spo2 maintained at 95% or greater with ambulation.

## 2020-01-23 NOTE — Progress Notes (Signed)
Trach changed. Continued # 4 Shiley. Patient tolerated well. ETCO2 - color change.

## 2020-01-23 NOTE — Discharge Instructions (Addendum)
As discussed, someone from Dr. Noreene Filbert office should be calling you to schedule an appointment for this afternoon. If you do not hear from them by lunchtime, call to schedule an appointment. I have included the number. I am sending you home with an antibiotic. Hold the antibiotics and ask the ENT about their recommendations after they take a look at your tracheostomy. Return to the ER for new or worsening symptoms.

## 2020-01-23 NOTE — ED Notes (Signed)
Pt trach suctioned per [facility]. Pt experiences some relief after suctioning.

## 2020-01-23 NOTE — ED Provider Notes (Signed)
[facility] EMERGENCY DEPARTMENT Provider Note   CSN: [id number] Arrival date & time: 01/23/20  5366     History Chief Complaint  Patient presents with  . Shortness of Breath    [name] is a 74 y.o. female with a past medical history significant for hypertrophic cardiomyopathy, hypertension, hyperlipidemia, chronic kidney disease stage IV, chronic A. Fib (not on anticoagulants due to GI bleed), chronic diastolic heart failure, complete heart block status post pacemaker, COPD, and chronic tracheostomy tube who presents to the ED due to progressively worsening shortness of breath.  Chart reviewed.  Patient was seen on 01/20/2020 and was diagnosed with a mild COPD exacerbation and prescribed prednisone.  Per patient, prednisone prescription was not filled. Patient notes she tried trach suction and a breathing treatment prior to arrival with no relief. Patient denies associative chest pain, nausea, vomiting, diarrhea, and abdominal pain. She notes she has had a cough for some time. Denies increase in lower extremity edema. Denies fever and chills. Spoke to [facility] who was unaware that patient was in the ED, but notes that they had a few positive COVID cases last week. RN notes that patient tested negative last Wednesday.   Daughter at bedside. Patient does have a history of newly diagnosed dementia,    Chart reviewed. Patient sees [facility]NT, Dr. Erik Obey for tracheostomy care. Per his last note, patient has continuous secretions from her tracheostomy which intermittent shortness of breath while coughing up secretions.   Past Medical History:  Diagnosis Date  . Anemia   . Angina   . Angioedema    2/2 ACE  . Arteriovenous malformation of stomach   . Arthritis   . Asthma   . AVM (arteriovenous malformation) of colon    small intestine; stomach  . Blood transfusion   . CHF (congestive heart failure) ([place])   . Complete heart block ([place])    s/p PPM 1998  .  Complication of anesthesia    Difficult airway; anaphylaxis and swelling with propofol  . DDD (degenerative disc disease)   . Depression   . Diastolic heart failure   . Fatty liver 07/26/10  . GERD (gastroesophageal reflux disease)   . GI bleed   . Headache   . History of alcohol abuse Stopped Fall 2012  . History of tobacco use Quit Fall 2012  . Hx of cardiovascular stress test    a. Lexiscan Myoview (10/15):  Small inferolateral and apical defect c/w scar and poss soft tissue attenuation, no ischemia, EF 42%  . Hx of colonic polyp 08/13/10   adenomatous  . Hx of colonoscopy   . Hyperlipidemia   . Hyperlipidemia   . Hypertension   . Hypertrophic cardiomyopathy ([place])    dx by Dr [name] 2009  . Iron deficiency anemia   . Myocardial infarction ([place])   . Panic attack   . Panic attacks   . Permanent atrial fibrillation ([place])   . Pneumonia   . Renal failure    baseline creatinine 1.6  . Right arm pain 01/08/2012  . RLS (restless legs syndrome)    Dx 06/2007  . Shortness of breath    sob on exertation  . Sleep apnea     Patient Active Problem List   Diagnosis Date Noted  . Cough with hemoptysis 08/02/2019  . SOB (shortness of breath)   . Atypical chest pain   . Chest pain on exertion 05/15/2019  . Cerebral embolism with cerebral infarction 05/09/2019  .  Expressive aphasia 05/08/2019  . Other abnormal glucose 11/26/2018  . Polypharmacy 06/25/2018  . Depression with anxiety   . Tracheostomy in place [facility])   . Tracheostomy care ([place])   . Calculus of gallbladder without cholecystitis without obstruction 12/24/2017  . Chronic cough 09/25/2017  . History of colonic polyps   . Moderate protein-calorie malnutrition ([place]) 06/02/2017  . Chronic idiopathic constipation 03/19/2017  . 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 Permanent atrial fibrillation ([place])   . Laryngeal stenosis 09/18/2016  . Esophageal dysphagia   . Polymyalgia rheumatica ([place]) 04/10/2015  . Hypertrophic  cardiomyopathy ([place]) 02/15/2015  . Eczema 09/18/2014  . Environmental allergies 09/18/2014  . Pre-diabetes 11/26/2013  . COPD, moderate ([place]) 11/01/2013  . Complete heart block ([place]) 04/03/2011  . Anemia 02/17/2011  . AVM (arteriovenous malformation) 02/13/2011  . PACEMAKER-[facility] 11/28/2010  . CKD (chronic kidney disease) stage 3, GFR 30-59 ml/min (HCC) 09/24/2010  . Acute on chronic congestive heart failure ([place]) 08/30/2010  . ARTHRITIS 07/24/2010  . Generalized anxiety disorder 07/23/2010  . Hyperlipidemia 02/11/2007  . Essential hypertension 02/11/2007  . APNEA, SLEEP 02/11/2007    Past Surgical History:  Procedure Laterality Date  . CARDIAC CATHETERIZATION    . CARDIAC CATHETERIZATION N/A 08/19/2016   Procedure: Right/Left Heart Cath and Coronary Angiography;  Surgeon: Jolaine Artist, MD;  Location: [facility];  Service: Cardiovascular;  Laterality: N/A;  . COLONOSCOPY WITH PROPOFOL N/A 06/16/2017   Procedure: COLONOSCOPY WITH PROPOFOL;  Surgeon: Mauri Pole, MD;  Location: [facility];  Service: Endoscopy;  Laterality: N/A;  . DIRECT LARYNGOSCOPY N/A 09/18/2016   Procedure: DIRECT LARYNGOSCOPY, BRONCHOSCOPY, REMOVAL OF INTUBATION GRANULOMA;  Surgeon: [name], MD;  Location: [facility] OR;  Service: ENT;  Laterality: N/A;  . DIRECT LARYNGOSCOPY N/A 10/20/2016   Procedure: EXTUBATION AND FLEXIBLE LARYNGOSCOPE;  Surgeon: [name], MD;  Location: [place];  Service: ENT;  Laterality: N/A;  . DIRECT LARYNGOSCOPY N/A 10/29/2016   Procedure: DIRECT LARYNGOSCOPY;  Surgeon: [name], MD;  [facility] OR;  Service: ENT;  Laterality: N/A;  . ESOPHAGOGASTRODUODENOSCOPY  12/23/2011   Procedure: ESOPHAGOGASTRODUODENOSCOPY (EGD);  Surgeon: [name], MD;  Location: [facility];  Service: Endoscopy;  Laterality: N/A;  . ESOPHAGOGASTRODUODENOSCOPY (EGD) WITH PROPOFOL N/A 06/16/2017   Procedure: ESOPHAGOGASTRODUODENOSCOPY (EGD) WITH PROPOFOL;  Surgeon: Mauri Pole, MD;  Location: [facility];  Service: Endoscopy;  Laterality: N/A;  . EXTUBATION (ENDOTRACHEAL) IN OR N/A 07/21/2016   Procedure: EXTUBATION (ENDOTRACHEAL) IN OR;  Surgeon: [name], MD;  Location: [place];  Service: ENT;  Laterality: N/A;  . GIVENS CAPSULE STUDY  12/23/2011   Procedure: GIVENS CAPSULE STUDY;  Surgeon: [name], MD;  Location: [facility];  Service: Endoscopy;  Laterality: N/A;  . MICROLARYNGOSCOPY WITH LASER N/A 10/16/2016   Procedure: MICRODIRECTLARYNGOSCOPY WITH LASER ABLATION AND KENLOG INJECTION;  Surgeon: [name], MD;  Location: [place];  Service: ENT;  Laterality: N/A;  . PACEMAKER INSERTION  1998   st jude, most recent gen change by [name] 4/12  . TRACHEOSTOMY TUBE PLACEMENT N/A 10/29/2016   Procedure: TRACHEOSTOMY;  Surgeon: [name], MD;  Location: [place];  Service: ENT;  Laterality: N/A;  . TUBAL LIGATION  04/01/2000     OB History   No obstetric history on file.     Family History  Problem Relation Age of Onset  . Hypertension Mother   . Stroke Mother   . Heart disease Mother   . Aneurysm Mother   . CVA Mother   .  Hypertension Father   . Stroke Father   . Heart attack Father   . Alcohol abuse Father   . Hypertension Sister   . Hypertension Brother   . Colon cancer Brother   . Cancer Brother   . Diabetes Sister   . Diabetes Brother   . Heart attack Brother   . Heart attack Brother   . Heart disease Sister   . Alcohol abuse Brother   . Heart disease Daughter     Social History   Tobacco Use  . Smoking status: Former Smoker    Packs/day: 0.10    Years: 50.00    Pack years: 5.00    Types: Cigarettes    Quit date: 08/16/2011    Years since quitting: 8.4  . Smokeless tobacco: Never Used  . Tobacco comment: quit 2012  Substance Use Topics  . Alcohol use: No    Comment: quit 08/16/2011  . Drug use: No    Home Medications Prior to Admission medications   Medication Sig Start Date End Date Taking? Authorizing Provider    acetaminophen (TYLENOL) 650 MG CR tablet Take 650-1,300 mg by mouth every 8 (eight) hours as needed for pain.   Yes [provider]  albuterol (PROVENTIL HFA;VENTOLIN HFA) 108 (90 Base) MCG/ACT inhaler Inhale 1-2 puffs into the lungs every 6 (six) hours as needed for wheezing or shortness of breath. 08/04/18  Yes Riccio, Angela C, DO  allopurinol (ZYLOPRIM) 100 MG tablet TAKE 2 TABLETS(200 MG) BY MOUTH DAILY Patient taking differently: Take 200 mg by mouth daily.  05/18/19  Yes Riccio, Gardiner Rhyme, DO  ALPRAZolam (XANAX) 0.5 MG tablet Take 1 tablet (0.5 mg total) by mouth at bedtime as needed for anxiety. 08/15/19  Yes Enid Derry, [name], DO  apixaban (ELIQUIS) 5 MG TABS tablet Take 1 tablet (5 mg total) by mouth 2 (two) times daily. 06/06/19  Yes Skeet Latch, MD  bimatoprost (LUMIGAN) 0.01 % SOLN Place 1 drop into both eyes at bedtime.   Yes [provider]  cetirizine (ZYRTEC) 10 MG tablet Take 10 mg by mouth daily. prn   Yes [provider]  Cholecalciferol (VITAMIN D) 50 MCG (2000 UT) tablet Take 2,000 Units by mouth daily.   Yes [provider]  donepezil (ARICEPT ODT) 10 MG disintegrating tablet Take 10 mg by mouth at bedtime. Take with 5 mg to equal a total dose of 15 mg   Yes [provider]  donepezil (ARICEPT ODT) 5 MG disintegrating tablet Take 5 mg by mouth at bedtime. Pt takes with 10 mg to equal a total dose of 15 mg   Yes [provider]  Epoetin Alfa-epbx (RETACRIT IJ) Inject as directed.   Yes [provider]  ferrous sulfate 325 (65 FE) MG tablet TAKE 1 TABLET BY MOUTH TWICE DAILY WITH MEALS TO KEEP BLOOD COUNT UP Patient taking differently: Take 325 mg by mouth 2 (two) times daily with a meal.  08/26/16  Yes Riccio, Angela C, DO  fexofenadine (ALLEGRA) 180 MG tablet Take 180 mg by mouth daily.   Yes [provider]  furosemide (LASIX) 20 MG tablet Take 1 tablet (20 mg total) by mouth daily. Daily as needed for  weight gain of 2lbs in a day or 5lbs in a week 12/14/19  Yes Skeet Latch, MD  furosemide (LASIX) 40 MG tablet Take 1 tablet (40 mg total) by mouth 2 (two) times daily. 12/14/19  Yes Skeet Latch, MD  guaiFENesin (MUCINEX) 600 MG 12 hr tablet  Take 1 tablet (600 mg total) by mouth 2 (two) times daily as needed for to loosen phlegm. 12/13/19  Yes Pfeiffer, Jeannie Done, MD  ipratropium-albuterol (DUONEB) 0.5-2.5 (3) MG/3ML SOLN USE 3 ML VIA NEBULIZER EVERY 6 HOURS Patient taking differently: Take 3 mLs by nebulization every 6 (six) hours as needed (shortness of breath).  10/21/18  Yes Riccio, Levada Dy C, DO  magnesium hydroxide (MILK OF MAGNESIA) 400 MG/5ML suspension Take 60 mLs by mouth daily as needed for mild constipation. prn   Yes [provider]  metoprolol tartrate (LOPRESSOR) 25 MG tablet Take 1 tablet (25 mg total) by mouth 2 (two) times daily. 09/30/19  Yes Skeet Latch, MD  nitroGLYCERIN (NITROSTAT) 0.4 MG SL tablet Place 1 tablet (0.4 mg total) under the tongue every 5 (five) minutes as needed for chest pain. 08/04/18  Yes Riccio, Angela C, DO  Olopatadine HCl 0.2 % SOLN Place 1-2 drops into both eyes daily as needed (itching).  02/17/18  Yes [provider]  polyethylene glycol (MIRALAX / GLYCOLAX) packet Take 17 g by mouth daily as needed for mild constipation. 11/26/18  Yes Riccio, Levada Dy C, DO  potassium chloride (K-DUR) 10 MEQ tablet Take 1 tablet (10 mEq total) by mouth daily. 08/19/19  Yes Enid Derry, [name], DO  predniSONE (DELTASONE) 5 MG tablet Take 5 mg by mouth daily with breakfast.   Yes [provider]  rosuvastatin (CRESTOR) 20 MG tablet Take 1 tablet (20 mg total) by mouth at bedtime. 08/19/19  Yes Enid Derry, [name], DO  sodium chloride (OCEAN) 0.65 % SOLN nasal spray Place 1 spray into both nostrils daily as needed for congestion.    Yes [provider]  triamcinolone cream (KENALOG) 0.1 % Apply 1 application topically 2 (two) times  daily. Patient taking differently: Apply 1 application topically 2 (two) times daily as needed (rash).  06/03/18  Yes Hensel, Jamal Collin, MD  azithromycin (ZITHROMAX) 250 MG tablet Take 1 tablet (250 mg total) by mouth daily. Take first 2 tablets together, then 1 every day until finished. 01/23/20   Suzy Bouchard, PA-C  loratadine (CLARITIN) 10 MG tablet Take 1 tablet (10 mg total) by mouth daily. One po daily x 5 days Patient not taking: Reported on 01/23/2020 12/13/19   Charlesetta Shanks, MD  oxymetazoline (AFRIN NASAL SPRAY) 0.05 % nasal spray Place 1 spray into both nostrils 2 (two) times daily. Patient not taking: Reported on 01/23/2020 05/21/19   Hall-Potvin, [place], PA-C  predniSONE (DELTASONE) 20 MG tablet 2 tabs po daily x 4 days 01/21/20   Sherwood Gambler, MD    Allergies    Ace inhibitors and Other  Review of Systems   Review of Systems  Unable to perform ROS: Dementia    Physical Exam Updated Vital Signs BP (!) 163/84   Pulse 70   Temp 97.7 F (36.5 C) (Oral)   Resp 16   SpO2 99%   Physical Exam Vitals and nursing note reviewed.  Constitutional:      General: She is not in acute distress.    Appearance: She is not toxic-appearing.  HENT:     Head: Normocephalic.  Eyes:     Pupils: Pupils are equal, round, and reactive to light.  Cardiovascular:     Rate and Rhythm: Normal rate and regular rhythm.     Pulses: Normal pulses.     Heart sounds: Normal heart sounds. No murmur. No friction rub. No gallop.   Pulmonary:     Effort: Pulmonary effort is normal.  Breath sounds: Normal breath sounds.     Comments: Respirations equal and unlabored, patient able to speak in full sentences, lungs clear to auscultation bilaterally. Tracheostomy in place Abdominal:     General: Abdomen is flat. Bowel sounds are normal. There is no distension.     Palpations: Abdomen is soft.     Tenderness: There is no abdominal tenderness. There is no guarding or rebound.  Musculoskeletal:      Cervical back: Neck supple.     Comments: Mild trace edema on bilateral ankles. Able to move all 4 extremities without difficulty.   Skin:    General: Skin is warm and dry.  Neurological:     General: No focal deficit present.     Mental Status: She is alert.  Psychiatric:        Mood and Affect: Mood normal.        Behavior: Behavior normal.     ED Results / Procedures / Treatments   Labs (all labs ordered are listed, but only abnormal results are displayed) Labs Reviewed  CBC WITH DIFFERENTIAL/PLATELET - Abnormal; Notable for the following components:      Result Value   RBC 3.75 (*)    Hemoglobin 10.2 (*)    HCT 35.2 (*)    MCHC 29.0 (*)    RDW 20.4 (*)    nRBC 1.1 (*)    All other components within normal limits  BASIC METABOLIC PANEL - Abnormal; Notable for the following components:   Potassium 3.4 (*)    Glucose, Bld 107 (*)    BUN 30 (*)    Creatinine, Ser 1.73 (*)    GFR calc non Af Amer 28 (*)    GFR calc Af Amer 33 (*)    All other components within normal limits  BRAIN NATRIURETIC PEPTIDE - Abnormal; Notable for the following components:   B Natriuretic Peptide 623.6 (*)    All other components within normal limits  NOVEL CORONAVIRUS, NAA (HOSP ORDER, SEND-OUT TO REF LAB; TAT 18-24 HRS)  POC SARS CORONAVIRUS 2 AG -  ED    EKG EKG Interpretation  Date/Time:  Monday January 23 2020 07:05:24 EST Ventricular Rate:  70 PR Interval:    QRS Duration: 91 QT Interval:  523 QTC Calculation: 565 R Axis:   -81 Text Interpretation: A-V dual-paced rhythm with some inhibition No further analysis attempted due to paced rhythm Confirmed by Dene Gentry [phone]) on 01/23/2020 7:12:36 AM   Radiology DG Chest Portable 1 View  Result Date: 01/23/2020 CLINICAL DATA:  75 year old female with shortness of breath cough and chest pain. EXAM: PORTABLE CHEST 1 VIEW COMPARISON:  Two-view chest radiographs 01/20/2020 and earlier. FINDINGS: Portable AP semi upright view at 0659  hours. Stable cardiomegaly and mediastinal contours. Stable dual lead right chest pacemaker. Stable tracheostomy with no adverse features. Stable lung volumes and pulmonary vascularity without overt edema. And otherwise when allowing for portable technique the lungs are clear. No acute osseous abnormality identified. IMPRESSION: Stable cardiomegaly. No acute cardiopulmonary abnormality. Electronically Signed   By: Genevie Ann M.D.   On: 01/23/2020 07:19    Procedures Procedures (including critical care time)  Medications Ordered in ED Medications  albuterol (VENTOLIN HFA) 108 (90 Base) MCG/ACT inhaler 2 puff (2 puffs Inhalation Given 01/23/20 1000)    ED Course  I have reviewed the triage vital signs and the nursing notes.  Pertinent labs & imaging results that were available during my care of the patient were reviewed by me  and considered in my medical decision making (see chart for details).  Clinical Course as of Jan 22 1000  Mon Jan 23, 2020  0930 Reassessed patient at bedside. She had the tubing in her trach removed. Once placed back in, patient was able to breath. She notes that she feels her trach is more clogged after RT suctioned it. Will have RT try again. Will also consult ENT to see if they have any further recommendations.    [CA]  [id number] Spoke to RT who is going to perform a trach change.   [CA]  [id number] Spoke to Dr. Blenda Nicely with ENT who is going to schedule patient for an appointment this afternoon with either Dr. Erik Obey or herself to evaluate tracheostomy tube further.    [CA]    Clinical Course User Index [CA] Suzy Bouchard, PA-C   MDM Rules/Calculators/A&P                     74 year old female presents to the ED due to shortness of breath.  Patient was seen on 12/20/2019 for the same complaint with reassuring work-up and was thought to have a mild COPD exacerbation.  Patient has a tracheostomy in place and follows Dr. Erik Obey with [facility]. Per Dr. Noreene Filbert note,  patient has chronic secretions with intermittent shortness of breath.  Stable vitals.  Patient afebrile, not tachycardic or hypoxic.  Patient in no acute distress and nontoxic-appearing.  Physical exam reassuring.  Lungs clear to auscultation bilaterally.  Very minimal trace edema of the lower extremities around her ankles. Will obtain routine labs, BNP given elevated recent BNP, and COVID test. [facility]oke to [facility] who states they had positive COVID cases last will.  Patient ambulated in ED and maintained O2 sats >95% without difficulty. Rapid COVID negative. Will send for PCR. BNP down trending from 12/20/19 (820-->623). Doubt CHF exacerbation given patient does not appear fluid overloaded on exam. CBC reassuring with no leukocytosis. BMP reassuring with mildly worsening renal function.  Chest x-ray personally reviewed which demonstrates stable cardiomegaly, but no signs of pneumonia or fluid overload. EKG personally reviewed which demonstrates paced rhythm with no signs of ischemia. RN suctioned tracheostomy with moderate improvement. Will have respiratory suction as well. Seems like shortness of breath is more of a chronic issue from previous notes given increased secretions through tracheostomy. Will treat with Z-pack. Discussed case with Dr. Francia Greaves who evaluated patient at bedside and agrees with assessment and plan. Tracheostomy changed by RT here in the ED. ENT will see patient today in office for further evaluation of tracheostomy. Advised patient to hold antibiotics until her ENT appointment this afternoon. Strict ED precautions discussed with patient. Patient states understanding and agrees to plan. Patient discharged home in no acute distress and stable vitals.  Final Clinical Impression(s) / ED Diagnoses Final diagnoses:  SOB (shortness of breath)  Tracheostomy complication, unspecified complication type [facility])    Rx / DC Orders ED Discharge Orders         Ordered    azithromycin  (ZITHROMAX) 250 MG tablet  Daily     01/23/20 0813           Suzy Bouchard, PA-C 01/23/20 1001    Valarie Merino, MD 01/27/20 1351

## 2020-01-23 NOTE — ED Triage Notes (Signed)
Pt arrives via EMS from home with chief complaint of SOB. Pt family reported the pt was seen on Friday and the pt was given prednisone and a prescription for prednisone that was never filled. Pt has a trach and tried suctioning and breathing treatments prior to calling EMS without relief.[name]

## 2020-01-23 NOTE — ED Notes (Signed)
RT at bedside.

## 2020-01-23 NOTE — ED Notes (Signed)
..  Patient verbalizes understanding of discharge instructions. Opportunity for questioning and answers were provided. Armband removed by staff, pt discharged from ED.  Instructions also given to patient's daughter. Pt's daughter verbalized understanding.

## 2020-01-24 LAB — NOVEL CORONAVIRUS, NAA (HOSP ORDER, SEND-OUT TO REF LAB; TAT 18-24 HRS): SARS-CoV-2, NAA: NOT DETECTED

## 2020-02-02 ENCOUNTER — Emergency Department (HOSPITAL_COMMUNITY)
Admission: EM | Admit: 2020-02-02 | Discharge: 2020-02-02 | Disposition: A | Discharge status: Home / Self Care | Payer: Medicare (Managed Care) | Attending: Emergency Medicine | Admitting: Emergency Medicine

## 2020-02-02 ENCOUNTER — Emergency Department (HOSPITAL_COMMUNITY): Payer: Medicare (Managed Care)

## 2020-02-02 ENCOUNTER — Other Ambulatory Visit: Payer: Self-pay

## 2020-02-02 DIAGNOSIS — Z7901 Long term (current) use of anticoagulants: Secondary | ICD-10-CM | POA: Diagnosis not present

## 2020-02-02 DIAGNOSIS — Z950 Presence of cardiac pacemaker: Secondary | ICD-10-CM | POA: Insufficient documentation

## 2020-02-02 DIAGNOSIS — I130 Hypertensive heart and chronic kidney disease with heart failure and stage 1 through stage 4 chronic kidney disease, or unspecified chronic kidney disease: Secondary | ICD-10-CM | POA: Diagnosis not present

## 2020-02-02 DIAGNOSIS — N183 Chronic kidney disease, stage 3 unspecified: Secondary | ICD-10-CM | POA: Insufficient documentation

## 2020-02-02 DIAGNOSIS — I509 Heart failure, unspecified: Secondary | ICD-10-CM | POA: Diagnosis not present

## 2020-02-02 DIAGNOSIS — R0600 Dyspnea, unspecified: Secondary | ICD-10-CM | POA: Diagnosis not present

## 2020-02-02 DIAGNOSIS — Z87891 Personal history of nicotine dependence: Secondary | ICD-10-CM | POA: Insufficient documentation

## 2020-02-02 DIAGNOSIS — Z930 Tracheostomy status: Secondary | ICD-10-CM | POA: Insufficient documentation

## 2020-02-02 DIAGNOSIS — Z79899 Other long term (current) drug therapy: Secondary | ICD-10-CM | POA: Insufficient documentation

## 2020-02-02 DIAGNOSIS — R0602 Shortness of breath: Secondary | ICD-10-CM | POA: Diagnosis present

## 2020-02-02 DIAGNOSIS — J45909 Unspecified asthma, uncomplicated: Secondary | ICD-10-CM | POA: Insufficient documentation

## 2020-02-02 LAB — CBC WITH DIFFERENTIAL/PLATELET
Abs Immature Granulocytes: 0.01 10*3/uL (ref 0.00–0.07)
Basophils Absolute: 0 10*3/uL (ref 0.0–0.1)
Basophils Relative: 1 %
Eosinophils Absolute: 0.1 10*3/uL (ref 0.0–0.5)
Eosinophils Relative: 3 %
HCT: 35.1 % — ABNORMAL LOW (ref 36.0–46.0)
Hemoglobin: 10 g/dL — ABNORMAL LOW (ref 12.0–15.0)
Immature Granulocytes: 0 %
Lymphocytes Relative: 27 %
Lymphs Abs: 1.1 10*3/uL (ref 0.7–4.0)
MCH: 26.8 pg (ref 26.0–34.0)
MCHC: 28.5 g/dL — ABNORMAL LOW (ref 30.0–36.0)
MCV: 94.1 fL (ref 80.0–100.0)
Monocytes Absolute: 0.5 10*3/uL (ref 0.1–1.0)
Monocytes Relative: 13 %
Neutro Abs: 2.2 10*3/uL (ref 1.7–7.7)
Neutrophils Relative %: 56 %
Platelets: 270 10*3/uL (ref 150–400)
RBC: 3.73 MIL/uL — ABNORMAL LOW (ref 3.87–5.11)
RDW: 20.4 % — ABNORMAL HIGH (ref 11.5–15.5)
WBC: 3.8 10*3/uL — ABNORMAL LOW (ref 4.0–10.5)
nRBC: 0 % (ref 0.0–0.2)

## 2020-02-02 LAB — BASIC METABOLIC PANEL
Anion gap: 10 (ref 5–15)
BUN: 16 mg/dL (ref 8–23)
CO2: 26 mmol/L (ref 22–32)
Calcium: 9.1 mg/dL (ref 8.9–10.3)
Chloride: 105 mmol/L (ref 98–111)
Creatinine, Ser: 1.48 mg/dL — ABNORMAL HIGH (ref 0.44–1.00)
GFR calc Af Amer: 39 mL/min — ABNORMAL LOW (ref >60)
GFR calc non Af Amer: 34 mL/min — ABNORMAL LOW (ref >60)
Glucose, Bld: 106 mg/dL — ABNORMAL HIGH (ref 70–99)
Potassium: 3.7 mmol/L (ref 3.5–5.1)
Sodium: 141 mmol/L (ref 135–145)

## 2020-02-02 LAB — BRAIN NATRIURETIC PEPTIDE: B Natriuretic Peptide: 623.9 pg/mL — ABNORMAL HIGH (ref 0.0–100.0)

## 2020-02-02 NOTE — ED Provider Notes (Deleted)
[facility] EMERGENCY DEPARTMENT Provider Note   CSN: [id number] Arrival date & time: 02/02/20  4098     History Chief Complaint  Patient presents with  . Shortness of Breath    [name] is a 74 y.o. female.  HPI     Past Medical History:  Diagnosis Date  . Anemia   . Angina   . Angioedema    2/2 ACE  . Arteriovenous malformation of stomach   . Arthritis   . Asthma   . AVM (arteriovenous malformation) of colon    small intestine; stomach  . Blood transfusion   . CHF (congestive heart failure) ([place])   . Complete heart block ([place])    s/p PPM 1998  . Complication of anesthesia    Difficult airway; anaphylaxis and swelling with propofol  . DDD (degenerative disc disease)   . Depression   . Diastolic heart failure   . Fatty liver 07/26/10  . GERD (gastroesophageal reflux disease)   . GI bleed   . Headache   . History of alcohol abuse Stopped Fall 2012  . History of tobacco use Quit Fall 2012  . Hx of cardiovascular stress test    a. Lexiscan Myoview (10/15):  Small inferolateral and apical defect c/w scar and poss soft tissue attenuation, no ischemia, EF 42%  . Hx of colonic polyp 08/13/10   adenomatous  . Hx of colonoscopy   . Hyperlipidemia   . Hyperlipidemia   . Hypertension   . Hypertrophic cardiomyopathy ([place])    dx by Dr [name] 2009  . Iron deficiency anemia   . Myocardial infarction ([place])   . Panic attack   . Panic attacks   . Permanent atrial fibrillation ([place])   . Pneumonia   . Renal failure    baseline creatinine 1.6  . Right arm pain 01/08/2012  . RLS (restless legs syndrome)    Dx 06/2007  . Shortness of breath    sob on exertation  . Sleep apnea     Patient Active Problem List   Diagnosis Date Noted  . Cough with hemoptysis 08/02/2019  . SOB (shortness of breath)   . Atypical chest pain   . Chest pain on exertion 05/15/2019  . Cerebral embolism with cerebral infarction 05/09/2019  . Expressive aphasia  05/08/2019  . Other abnormal glucose 11/26/2018  . Polypharmacy 06/25/2018  . Depression with anxiety   . Tracheostomy in place [facility])   . Tracheostomy care ([place])   . Calculus of gallbladder without cholecystitis without obstruction 12/24/2017  . Chronic cough 09/25/2017  . History of colonic polyps   . Moderate protein-calorie malnutrition ([place]) 06/02/2017  . Chronic idiopathic constipation 03/19/2017  . 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 Permanent atrial fibrillation ([place])   . Laryngeal stenosis 09/18/2016  . Esophageal dysphagia   . Polymyalgia rheumatica ([place]) 04/10/2015  . Hypertrophic cardiomyopathy ([place]) 02/15/2015  . Eczema 09/18/2014  . Environmental allergies 09/18/2014  . Pre-diabetes 11/26/2013  . COPD, moderate ([place]) 11/01/2013  . Complete heart block ([place]) 04/03/2011  . Anemia 02/17/2011  . AVM (arteriovenous malformation) 02/13/2011  . PACEMAKER-[facility] 11/28/2010  . CKD (chronic kidney disease) stage 3, GFR 30-59 ml/min (HCC) 09/24/2010  . Acute on chronic congestive heart failure ([place]) 08/30/2010  . ARTHRITIS 07/24/2010  . Generalized anxiety disorder 07/23/2010  . Hyperlipidemia 02/11/2007  . Essential hypertension 02/11/2007  . APNEA, SLEEP 02/11/2007    Past Surgical History:  Procedure Laterality Date  . CARDIAC CATHETERIZATION    .  CARDIAC CATHETERIZATION N/A 08/19/2016   Procedure: Right/Left Heart Cath and Coronary Angiography;  Surgeon: Jolaine Artist, MD;  Location: [place] CV LAB;  Service: Cardiovascular;  Laterality: N/A;  . COLONOSCOPY WITH PROPOFOL N/A 06/16/2017   Procedure: COLONOSCOPY WITH PROPOFOL;  Surgeon: Mauri Pole, MD;  Location: [facility] ENDOSCOPY;  Service: Endoscopy;  Laterality: N/A;  . DIRECT LARYNGOSCOPY N/A 09/18/2016   Procedure: DIRECT LARYNGOSCOPY, BRONCHOSCOPY, REMOVAL OF INTUBATION GRANULOMA;  Surgeon: [name], MD;  Location[facility] OR;  Service: ENT;  Laterality: N/A;  . DIRECT LARYNGOSCOPY N/A 10/20/2016   Procedure:  EXTUBATION AND FLEXIBLE LARYNGOSCOPE;  Surgeon: [name], MD;  Location: [place];  Service: ENT;  Laterality: N/A;  . DIRECT LARYNGOSCOPY N/A 10/29/2016   Procedure: DIRECT LARYNGOSCOPY;  Surgeon: [name], MD;  Location: [facility] OR;  Service: ENT;  Laterality: N/A;  . ESOPHAGOGASTRODUODENOSCOPY  12/23/2011   Procedure: ESOPHAGOGASTRODUODENOSCOPY (EGD);  Surgeon: [name], MD;  Location: [facility];  Service: Endoscopy;  Laterality: N/A;  . ESOPHAGOGASTRODUODENOSCOPY (EGD) WITH PROPOFOL N/A 06/16/2017   Procedure: ESOPHAGOGASTRODUODENOSCOPY (EGD) WITH PROPOFOL;  Surgeon: Mauri Pole, MD;  Location: [facility];  Service: Endoscopy;  Laterality: N/A;  . EXTUBATION (ENDOTRACHEAL) IN OR N/A 07/21/2016   Procedure: EXTUBATION (ENDOTRACHEAL) IN OR;  Surgeon: [name], MD;  Location: [place];  Service: ENT;  Laterality: N/A;  . GIVENS CAPSULE STUDY  12/23/2011   Procedure: GIVENS CAPSULE STUDY;  Surgeon: [name], MD;  Location: [facility];  Service: Endoscopy;  Laterality: N/A;  . MICROLARYNGOSCOPY WITH LASER N/A 10/16/2016   Procedure: MICRODIRECTLARYNGOSCOPY WITH LASER ABLATION AND KENLOG INJECTION;  Surgeon: [name], MD;  Location: [place];  Service: ENT;  Laterality: N/A;  . PACEMAKER INSERTION  1998   st jude, most recent gen change by [name] 4/12  . TRACHEOSTOMY TUBE PLACEMENT N/A 10/29/2016   Procedure: TRACHEOSTOMY;  Surgeon: [name], MD;  Location: [place];  Service: ENT;  Laterality: N/A;  . TUBAL LIGATION  04/01/2000     OB History   No obstetric history on file.     Family History  Problem Relation Age of Onset  . Hypertension Mother   . Stroke Mother   . Heart disease Mother   . Aneurysm Mother   . CVA Mother   . Hypertension Father   . Stroke Father   . Heart attack Father   . Alcohol abuse Father   . Hypertension Sister   . Hypertension Brother   . Colon cancer Brother   . Cancer Brother   . Diabetes Sister   . Diabetes Brother   . Heart  attack Brother   . Heart attack Brother   . Heart disease Sister   . Alcohol abuse Brother   . Heart disease Daughter     Social History   Tobacco Use  . Smoking status: Former Smoker    Packs/day: 0.10    Years: 50.00    Pack years: 5.00    Types: Cigarettes    Quit date: 08/16/2011    Years since quitting: 8.4  . Smokeless tobacco: Never Used  . Tobacco comment: quit 2012  Substance Use Topics  . Alcohol use: No    Comment: quit 08/16/2011  . Drug use: No    Home Medications Prior to Admission medications   Medication Sig Start Date End Date Taking? Authorizing Provider  acetaminophen (TYLENOL) 650 MG CR tablet Take 650-1,300 mg by mouth every 8 (eight) hours as needed for pain.  [provider]  albuterol (PROVENTIL HFA;VENTOLIN HFA) 108 (90 Base) MCG/ACT inhaler Inhale 1-2 puffs into the lungs every 6 (six) hours as needed for wheezing or shortness of breath. 08/04/18   Riccio, Gardiner Rhyme, DO  allopurinol (ZYLOPRIM) 100 MG tablet TAKE 2 TABLETS(200 MG) BY MOUTH DAILY Patient taking differently: Take 200 mg by mouth daily.  05/18/19   Steve Rattler, DO  ALPRAZolam (XANAX) 0.5 MG tablet Take 1 tablet (0.5 mg total) by mouth at bedtime as needed for anxiety. 08/15/19   [name], [name], DO  apixaban (ELIQUIS) 5 MG TABS tablet Take 1 tablet (5 mg total) by mouth 2 (two) times daily. 06/06/19   Skeet Latch, MD  azithromycin (ZITHROMAX) 250 MG tablet Take 1 tablet (250 mg total) by mouth daily. Take first 2 tablets together, then 1 every day until finished. 01/23/20   Suzy Bouchard, PA-C  bimatoprost (LUMIGAN) 0.01 % SOLN Place 1 drop into both eyes at bedtime.    [provider]  cetirizine (ZYRTEC) 10 MG tablet Take 10 mg by mouth daily. prn    [provider]  Cholecalciferol (VITAMIN D) 50 MCG (2000 UT) tablet Take 2,000 Units by mouth daily.    [provider]  donepezil (ARICEPT ODT) 10 MG disintegrating tablet Take 10 mg by mouth at  bedtime. Take with 5 mg to equal a total dose of 15 mg    [provider]  donepezil (ARICEPT ODT) 5 MG disintegrating tablet Take 5 mg by mouth at bedtime. Pt takes with 10 mg to equal a total dose of 15 mg    [provider]  Epoetin Alfa-epbx (RETACRIT IJ) Inject as directed.    [provider]  ferrous sulfate 325 (65 FE) MG tablet TAKE 1 TABLET BY MOUTH TWICE DAILY WITH MEALS TO KEEP BLOOD COUNT UP Patient taking differently: Take 325 mg by mouth 2 (two) times daily with a meal.  08/26/16   Riccio, Gardiner Rhyme, DO  fexofenadine (ALLEGRA) 180 MG tablet Take 180 mg by mouth daily.    [provider]  furosemide (LASIX) 20 MG tablet Take 1 tablet (20 mg total) by mouth daily. Daily as needed for weight gain of 2lbs in a day or 5lbs in a week 12/14/19   Skeet Latch, MD  furosemide (LASIX) 40 MG tablet Take 1 tablet (40 mg total) by mouth 2 (two) times daily. 12/14/19   Skeet Latch, MD  guaiFENesin (MUCINEX) 600 MG 12 hr tablet Take 1 tablet (600 mg total) by mouth 2 (two) times daily as needed for to loosen phlegm. 12/13/19   Charlesetta Shanks, MD  ipratropium-albuterol (DUONEB) 0.5-2.5 (3) MG/3ML SOLN USE 3 ML VIA NEBULIZER EVERY 6 HOURS Patient taking differently: Take 3 mLs by nebulization every 6 (six) hours as needed (shortness of breath).  10/21/18   Steve Rattler, DO  loratadine (CLARITIN) 10 MG tablet Take 1 tablet (10 mg total) by mouth daily. One po daily x 5 days Patient not taking: Reported on 01/23/2020 12/13/19   Charlesetta Shanks, MD  magnesium hydroxide (MILK OF MAGNESIA) 400 MG/5ML suspension Take 60 mLs by mouth daily as needed for mild constipation. prn    [provider]  metoprolol tartrate (LOPRESSOR) 25 MG tablet Take 1 tablet (25 mg total) by mouth 2 (two) times daily. 09/30/19   Skeet Latch, MD  nitroGLYCERIN (NITROSTAT) 0.4 MG SL tablet Place 1 tablet (0.4 mg total) under the tongue every 5 (five) minutes as needed for  chest  pain. 08/04/18   Lucila Maine C, DO  Olopatadine HCl 0.2 % SOLN Place 1-2 drops into both eyes daily as needed (itching).  02/17/18   [provider]  oxymetazoline (AFRIN NASAL SPRAY) 0.05 % nasal spray Place 1 spray into both nostrils 2 (two) times daily. Patient not taking: Reported on 01/23/2020 05/21/19   Hall-Potvin, [place], PA-C  polyethylene glycol (MIRALAX / GLYCOLAX) packet Take 17 g by mouth daily as needed for mild constipation. 11/26/18   Steve Rattler, DO  potassium chloride (K-DUR) 10 MEQ tablet Take 1 tablet (10 mEq total) by mouth daily. 08/19/19   [name], [name], DO  predniSONE (DELTASONE) 20 MG tablet 2 tabs po daily x 4 days 01/21/20   Sherwood Gambler, MD  predniSONE (DELTASONE) 5 MG tablet Take 5 mg by mouth daily with breakfast.    [provider]  rosuvastatin (CRESTOR) 20 MG tablet Take 1 tablet (20 mg total) by mouth at bedtime. 08/19/19   [name], [name], DO  sodium chloride (OCEAN) 0.65 % SOLN nasal spray Place 1 spray into both nostrils daily as needed for congestion.     [provider]  triamcinolone cream (KENALOG) 0.1 % Apply 1 application topically 2 (two) times daily. Patient taking differently: Apply 1 application topically 2 (two) times daily as needed (rash).  06/03/18   Zenia Resides, MD    Allergies    Ace inhibitors and Other  Review of Systems   Review of Systems  Physical Exam Updated Vital Signs BP 118/87 (BP Location: Right Arm)   Pulse 70   Temp (!) 97.4 F (36.3 C) (Oral)   SpO2 100%   Physical Exam  ED Results / Procedures / Treatments   Labs (all labs ordered are listed, but only abnormal results are displayed) Labs Reviewed - No data to display  EKG None  Radiology No results found.  Procedures Procedures (including critical care time)  Medications Ordered in ED Medications - No data to display  ED Course  I have reviewed the triage vital signs and the nursing notes.  Pertinent labs &  imaging results that were available during my care of the patient were reviewed by me and considered in my medical decision making (see chart for details).    MDM Rules/Calculators/A&P                       Final Clinical Impression(s) / ED Diagnoses Final diagnoses:  None    Rx / DC Orders ED Discharge Orders    None       Virgel Manifold, MD 02/13/20 [phone]

## 2020-02-02 NOTE — ED Triage Notes (Signed)
Came in via EMS from home; Pt c/o of shortness of breathe and discomfort on trach site; reported recently changed on 01/30/2020 and has had some blockage since then and has been worsening. EMS endorsed patient on 10 L supplemental o2 and has Sp02 of 100%.

## 2020-02-02 NOTE — ED Notes (Signed)
Patient's supplemental o2 decreased to 2L; no changes in Sp02 or RR noteb. NAD. Sp02 100%

## 2020-02-02 NOTE — ED Notes (Signed)
Patient was asked general screening questions : "Do you feel safe at home?" Patient initially stated no, and re-iterated "is somebody harming you and you fear for your safety" Patient responded, "no, nothing like that" Patient then verbalized "I'm scared that I can't breathe" patients lung sounds are clear upon auscultation, minimum amount of secretions noted when suctioned. Comforted patient and  Sp02 is 100 % with good pleth.

## 2020-02-07 NOTE — ED Provider Notes (Signed)
[facility] EMERGENCY DEPARTMENT Provider Note   CSN: [id number] Arrival date & time: 02/02/20  1093     History Chief Complaint  Patient presents with  . Shortness of Breath    [name] is a 74 y.o. female.  HPI   74 year old female with dyspnea.  She feels she is not having great movement through her tracheostomy.  She is having some secretions.  No fevers or chills.  No acute pain.  No unusual swelling.  Past Medical History:  Diagnosis Date  . Anemia   . Angina   . Angioedema    2/2 ACE  . Arteriovenous malformation of stomach   . Arthritis   . Asthma   . AVM (arteriovenous malformation) of colon    small intestine; stomach  . Blood transfusion   . CHF (congestive heart failure) ([place])   . Complete heart block ([place])    s/p PPM 1998  . Complication of anesthesia    Difficult airway; anaphylaxis and swelling with propofol  . DDD (degenerative disc disease)   . Depression   . Diastolic heart failure   . Fatty liver 07/26/10  . GERD (gastroesophageal reflux disease)   . GI bleed   . Headache   . History of alcohol abuse Stopped Fall 2012  . History of tobacco use Quit Fall 2012  . Hx of cardiovascular stress test    a. Lexiscan Myoview (10/15):  Small inferolateral and apical defect c/w scar and poss soft tissue attenuation, no ischemia, EF 42%  . Hx of colonic polyp 08/13/10   adenomatous  . Hx of colonoscopy   . Hyperlipidemia   . Hyperlipidemia   . Hypertension   . Hypertrophic cardiomyopathy ([place])    dx by Dr [name] 2009  . Iron deficiency anemia   . Myocardial infarction ([place])   . Panic attack   . Panic attacks   . Permanent atrial fibrillation ([place])   . Pneumonia   . Renal failure    baseline creatinine 1.6  . Right arm pain 01/08/2012  . RLS (restless legs syndrome)    Dx 06/2007  . Shortness of breath    sob on exertation  . Sleep apnea     Patient Active Problem List   Diagnosis Date Noted  . Cough with  hemoptysis 08/02/2019  . SOB (shortness of breath)   . Atypical chest pain   . Chest pain on exertion 05/15/2019  . Cerebral embolism with cerebral infarction 05/09/2019  . Expressive aphasia 05/08/2019  . Other abnormal glucose 11/26/2018  . Polypharmacy 06/25/2018  . Depression with anxiety   . Tracheostomy in place [facility])   . Tracheostomy care ([place])   . Calculus of gallbladder without cholecystitis without obstruction 12/24/2017  . Chronic cough 09/25/2017  . History of colonic polyps   . Moderate protein-calorie malnutrition ([place]) 06/02/2017  . Chronic idiopathic constipation 03/19/2017  . 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 Permanent atrial fibrillation ([place])   . Laryngeal stenosis 09/18/2016  . Esophageal dysphagia   . Polymyalgia rheumatica ([place]) 04/10/2015  . Hypertrophic cardiomyopathy ([place]) 02/15/2015  . Eczema 09/18/2014  . Environmental allergies 09/18/2014  . Pre-diabetes 11/26/2013  . COPD, moderate ([place]) 11/01/2013  . Complete heart block ([place]) 04/03/2011  . Anemia 02/17/2011  . AVM (arteriovenous malformation) 02/13/2011  . PACEMAKER-[place] 11/28/2010  . CKD (chronic kidney disease) stage 3, GFR 30-59 ml/min (HCC) 09/24/2010  . Acute on chronic congestive heart failure ([place]) 08/30/2010  . ARTHRITIS 07/24/2010  . Generalized anxiety  disorder 07/23/2010  . Hyperlipidemia 02/11/2007  . Essential hypertension 02/11/2007  . APNEA, SLEEP 02/11/2007    Past Surgical History:  Procedure Laterality Date  . CARDIAC CATHETERIZATION    . CARDIAC CATHETERIZATION N/A 08/19/2016   Procedure: Right/Left Heart Cath and Coronary Angiography;  Surgeon: Jolaine Artist, MD;  Location: [place] CV LAB;  Service: Cardiovascular;  Laterality: N/A;  . COLONOSCOPY WITH PROPOFOL N/A 06/16/2017   Procedure: COLONOSCOPY WITH PROPOFOL;  Surgeon: Mauri Pole, MD;  Location: [facility];  Service: Endoscopy;  Laterality: N/A;  . DIRECT LARYNGOSCOPY N/A 09/18/2016   Procedure:  DIRECT LARYNGOSCOPY, BRONCHOSCOPY, REMOVAL OF INTUBATION GRANULOMA;  Surgeon: [name], MD;  Location: [facility] OR;  Service: ENT;  Laterality: N/A;  . DIRECT LARYNGOSCOPY N/A 10/20/2016   Procedure: EXTUBATION AND FLEXIBLE LARYNGOSCOPE;  Surgeon: [name], MD;  Location: [place];  Service: ENT;  Laterality: N/A;  . DIRECT LARYNGOSCOPY N/A 10/29/2016   Procedure: DIRECT LARYNGOSCOPY;  Surgeon: [name], MD;  Location[facility] OR;  Service: ENT;  Laterality: N/A;  . ESOPHAGOGASTRODUODENOSCOPY  12/23/2011   Procedure: ESOPHAGOGASTRODUODENOSCOPY (EGD);  Surgeon: [name], MD;  Location: [facility];  Service: Endoscopy;  Laterality: N/A;  . ESOPHAGOGASTRODUODENOSCOPY (EGD) WITH PROPOFOL N/A 06/16/2017   Procedure: ESOPHAGOGASTRODUODENOSCOPY (EGD) WITH PROPOFOL;  Surgeon: Mauri Pole, MD;  Location: [facility];  Service: Endoscopy;  Laterality: N/A;  . EXTUBATION (ENDOTRACHEAL) IN OR N/A 07/21/2016   Procedure: EXTUBATION (ENDOTRACHEAL) IN OR;  Surgeon: [name], MD;  Location: [place];  Service: ENT;  Laterality: N/A;  . GIVENS CAPSULE STUDY  12/23/2011   Procedure: GIVENS CAPSULE STUDY;  Surgeon: [name], MD;  Location: [facility];  Service: Endoscopy;  Laterality: N/A;  . MICROLARYNGOSCOPY WITH LASER N/A 10/16/2016   Procedure: MICRODIRECTLARYNGOSCOPY WITH LASER ABLATION AND KENLOG INJECTION;  Surgeon: [name], MD;  Location: [place];  Service: ENT;  Laterality: N/A;  . PACEMAKER INSERTION  1998   st jude, most recent gen change by [name] 4/12  . TRACHEOSTOMY TUBE PLACEMENT N/A 10/29/2016   Procedure: TRACHEOSTOMY;  Surgeon: [name], MD;  Location: [place];  Service: ENT;  Laterality: N/A;  . TUBAL LIGATION  04/01/2000     OB History   No obstetric history on file.     Family History  Problem Relation Age of Onset  . Hypertension Mother   . Stroke Mother   . Heart disease Mother   . Aneurysm Mother   . CVA Mother   . Hypertension Father   . Stroke Father   .  Heart attack Father   . Alcohol abuse Father   . Hypertension Sister   . Hypertension Brother   . Colon cancer Brother   . Cancer Brother   . Diabetes Sister   . Diabetes Brother   . Heart attack Brother   . Heart attack Brother   . Heart disease Sister   . Alcohol abuse Brother   . Heart disease Daughter     Social History   Tobacco Use  . Smoking status: Former Smoker    Packs/day: 0.10    Years: 50.00    Pack years: 5.00    Types: Cigarettes    Quit date: 08/16/2011    Years since quitting: 8.4  . Smokeless tobacco: Never Used  . Tobacco comment: quit 2012  Substance Use Topics  . Alcohol use: No    Comment: quit 08/16/2011  . Drug use: No    Home Medications Prior to  Admission medications   Medication Sig Start Date End Date Taking? Authorizing Provider  acetaminophen (TYLENOL) 650 MG CR tablet Take 650-1,300 mg by mouth every 8 (eight) hours as needed for pain.    [provider]  albuterol (PROVENTIL HFA;VENTOLIN HFA) 108 (90 Base) MCG/ACT inhaler Inhale 1-2 puffs into the lungs every 6 (six) hours as needed for wheezing or shortness of breath. 08/04/18   Riccio, Gardiner Rhyme, DO  allopurinol (ZYLOPRIM) 100 MG tablet TAKE 2 TABLETS(200 MG) BY MOUTH DAILY Patient taking differently: Take 200 mg by mouth daily.  05/18/19   Steve Rattler, DO  ALPRAZolam (XANAX) 0.5 MG tablet Take 1 tablet (0.5 mg total) by mouth at bedtime as needed for anxiety. 08/15/19   [name], [name], DO  apixaban (ELIQUIS) 5 MG TABS tablet Take 1 tablet (5 mg total) by mouth 2 (two) times daily. 06/06/19   Skeet Latch, MD  azithromycin (ZITHROMAX) 250 MG tablet Take 1 tablet (250 mg total) by mouth daily. Take first 2 tablets together, then 1 every day until finished. 01/23/20   Suzy Bouchard, PA-C  bimatoprost (LUMIGAN) 0.01 % SOLN Place 1 drop into both eyes at bedtime.    [provider]  cetirizine (ZYRTEC) 10 MG tablet Take 10 mg by mouth daily. prn    [provider]  Cholecalciferol (VITAMIN D) 50 MCG (2000 UT) tablet Take 2,000 Units by mouth daily.    [provider]  donepezil (ARICEPT ODT) 10 MG disintegrating tablet Take 10 mg by mouth at bedtime. Take with 5 mg to equal a total dose of 15 mg    [provider]  donepezil (ARICEPT ODT) 5 MG disintegrating tablet Take 5 mg by mouth at bedtime. Pt takes with 10 mg to equal a total dose of 15 mg    [provider]  Epoetin Alfa-epbx (RETACRIT IJ) Inject as directed.    [provider]  ferrous sulfate 325 (65 FE) MG tablet TAKE 1 TABLET BY MOUTH TWICE DAILY WITH MEALS TO KEEP BLOOD COUNT UP Patient taking differently: Take 325 mg by mouth 2 (two) times daily with a meal.  08/26/16   Riccio, Gardiner Rhyme, DO  fexofenadine (ALLEGRA) 180 MG tablet Take 180 mg by mouth daily.    [provider]  furosemide (LASIX) 20 MG tablet Take 1 tablet (20 mg total) by mouth daily. Daily as needed for weight gain of 2lbs in a day or 5lbs in a week 12/14/19   Skeet Latch, MD  furosemide (LASIX) 40 MG tablet Take 1 tablet (40 mg total) by mouth 2 (two) times daily. 12/14/19   Skeet Latch, MD  guaiFENesin (MUCINEX) 600 MG 12 hr tablet Take 1 tablet (600 mg total) by mouth 2 (two) times daily as needed for to loosen phlegm. 12/13/19   Charlesetta Shanks, MD  ipratropium-albuterol (DUONEB) 0.5-2.5 (3) MG/3ML SOLN USE 3 ML VIA NEBULIZER EVERY 6 HOURS Patient taking differently: Take 3 mLs by nebulization every 6 (six) hours as needed (shortness of breath).  10/21/18   Steve Rattler, DO  loratadine (CLARITIN) 10 MG tablet Take 1 tablet (10 mg total) by mouth daily. One po daily x 5 days Patient not taking: Reported on 01/23/2020 12/13/19   Charlesetta Shanks, MD  magnesium hydroxide (MILK OF MAGNESIA) 400 MG/5ML suspension Take 60 mLs by mouth daily as needed for mild constipation. prn    [provider]  metoprolol tartrate (LOPRESSOR) 25 MG tablet Take 1 tablet (25 mg  total)  by mouth 2 (two) times daily. 09/30/19   Skeet Latch, MD  nitroGLYCERIN (NITROSTAT) 0.4 MG SL tablet Place 1 tablet (0.4 mg total) under the tongue every 5 (five) minutes as needed for chest pain. 08/04/18   Steve Rattler, DO  Olopatadine HCl 0.2 % SOLN Place 1-2 drops into both eyes daily as needed (itching).  02/17/18   [provider]  oxymetazoline (AFRIN NASAL SPRAY) 0.05 % nasal spray Place 1 spray into both nostrils 2 (two) times daily. Patient not taking: Reported on 01/23/2020 05/21/19   Hall-Potvin, [place], PA-C  polyethylene glycol (MIRALAX / GLYCOLAX) packet Take 17 g by mouth daily as needed for mild constipation. 11/26/18   Steve Rattler, DO  potassium chloride (K-DUR) 10 MEQ tablet Take 1 tablet (10 mEq total) by mouth daily. 08/19/19   [name], [name], DO  predniSONE (DELTASONE) 20 MG tablet 2 tabs po daily x 4 days 01/21/20   Sherwood Gambler, MD  predniSONE (DELTASONE) 5 MG tablet Take 5 mg by mouth daily with breakfast.    [provider]  rosuvastatin (CRESTOR) 20 MG tablet Take 1 tablet (20 mg total) by mouth at bedtime. 08/19/19   [name], [name], DO  sodium chloride (OCEAN) 0.65 % SOLN nasal spray Place 1 spray into both nostrils daily as needed for congestion.     [provider]  triamcinolone cream (KENALOG) 0.1 % Apply 1 application topically 2 (two) times daily. Patient taking differently: Apply 1 application topically 2 (two) times daily as needed (rash).  06/03/18   Zenia Resides, MD    Allergies    Ace inhibitors and Other  Review of Systems   Review of Systems All systems reviewed and negative, other than as noted in HPI.  Physical Exam Updated Vital Signs BP 131/80   Pulse 70   Temp (!) 97.4 F (36.3 C) (Oral)   Resp 17   SpO2 100%   Physical Exam Vitals and nursing note reviewed.  Constitutional:      General: She is not in acute distress.    Appearance: She is well-developed. She is obese.  HENT:     Head:  Normocephalic and atraumatic.  Eyes:     General:        Right eye: No discharge.        Left eye: No discharge.     Conjunctiva/sclera: Conjunctivae normal.  Cardiovascular:     Rate and Rhythm: Normal rate and regular rhythm.     Heart sounds: Normal heart sounds. No murmur. No friction rub. No gallop.   Pulmonary:     Effort: Pulmonary effort is normal. No respiratory distress.     Breath sounds: Normal breath sounds.     Comments: Tracheostomy in place.  No significant secretions noted.  Good air movement. Abdominal:     General: There is no distension.     Palpations: Abdomen is soft.     Tenderness: There is no abdominal tenderness.  Musculoskeletal:        General: No tenderness.     Cervical back: Neck supple.  Skin:    General: Skin is warm and dry.  Neurological:     Mental Status: She is alert.  Psychiatric:        Behavior: Behavior normal.        Thought Content: Thought content normal.     ED Results / Procedures / Treatments   Labs (all labs ordered are listed, but only abnormal results are displayed) Labs Reviewed  CBC WITH DIFFERENTIAL/PLATELET - Abnormal; Notable for the following components:      Result Value   WBC 3.8 (*)    RBC 3.73 (*)    Hemoglobin 10.0 (*)    HCT 35.1 (*)    MCHC 28.5 (*)    RDW 20.4 (*)    All other components within normal limits  BRAIN NATRIURETIC PEPTIDE - Abnormal; Notable for the following components:   B Natriuretic Peptide 623.9 (*)    All other components within normal limits  BASIC METABOLIC PANEL - Abnormal; Notable for the following components:   Glucose, Bld 106 (*)    Creatinine, Ser 1.48 (*)    GFR calc non Af Amer 34 (*)    GFR calc Af Amer 39 (*)    All other components within normal limits    EKG EKG Interpretation  Date/Time:  Thursday February 02 2020 08:41:18 EST Ventricular Rate:  70 PR Interval:    QRS Duration: 211 QT Interval:  498 QTC Calculation: 538 R Axis:   -86 Text  Interpretation: AV dual-paced rhythm with occassional Premature ventricular complexes No significant change since last tracing Confirmed by Blanchie Dessert [phone]) on 02/03/2020 6:12:54 PM   Radiology No results found.   DG Chest 2 View  Result Date: 01/20/2020 CLINICAL DATA:  Shortness of breath EXAM: CHEST - 2 VIEW COMPARISON:  12/13/2019 FINDINGS: Cardiomegaly with right chest multi lead pacer defibrillator. Tracheostomy. Both lungs are clear. The visualized skeletal structures are unremarkable. IMPRESSION: 1.  Tracheostomy without acute abnormality of the lungs. 2.  Cardiomegaly. Electronically Signed   By: Eddie Candle M.D.   On: 01/20/2020 13:38   DG Chest Portable 1 View  Result Date: 02/02/2020 CLINICAL DATA:  Shortness of breath with cough and tachycardia EXAM: PORTABLE CHEST 1 VIEW COMPARISON:  January 23, 2020 FINDINGS: Endotracheal tube tip is 6.5 cm above the carina. There is a pacemaker present with lead tips attached to right atrium and right ventricle. No pneumothorax. There is mild bibasilar interstitial thickening. No edema or airspace opacity evident. There is cardiomegaly. No adenopathy. There is aortic atherosclerosis. No bone lesions. IMPRESSION: Tracheostomy as described without pneumothorax. Pacemaker leads attached to right atrium and right ventricle. Interstitial thickening in the bases may reflect a degree of underlying chronic inflammatory type change. No frank edema or consolidation. Stable cardiomegaly. Aortic Atherosclerosis (ICD10-I70.0). Electronically Signed   By: Lowella Grip III M.D.   On: 02/02/2020 09:35   DG Chest Portable 1 View  Result Date: 01/23/2020 CLINICAL DATA:  74 year old female with shortness of breath cough and chest pain. EXAM: PORTABLE CHEST 1 VIEW COMPARISON:  Two-view chest radiographs 01/20/2020 and earlier. FINDINGS: Portable AP semi upright view at 0659 hours. Stable cardiomegaly and mediastinal contours. Stable dual lead right chest  pacemaker. Stable tracheostomy with no adverse features. Stable lung volumes and pulmonary vascularity without overt edema. And otherwise when allowing for portable technique the lungs are clear. No acute osseous abnormality identified. IMPRESSION: Stable cardiomegaly. No acute cardiopulmonary abnormality. Electronically Signed   By: Genevie Ann M.D.   On: 01/23/2020 07:19    Procedures Procedures (including critical care time)  Medications Ordered in ED Medications - No data to display  ED Course  I have reviewed the triage vital signs and the nursing notes.  Pertinent labs & imaging results that were available during my care of the patient were reviewed by me and considered in my medical decision making (see chart for details).    MDM Rules/Calculators/A&P  74 year old female with dyspnea.  I think this is primarily some discomfort of her tracheostomy.  Objectively her exam is reassuring.  Lurline Idol seems to be working fine.  Lungs sound clear.  ED work-up fairly unremarkable.  Follow-up in trach clinic.    Final Clinical Impression(s) / ED Diagnoses Final diagnoses:  Dyspnea, unspecified type    Rx / DC Orders ED Discharge Orders    None       Virgel Manifold, MD 02/07/20 [phone]

## 2020-02-13 ENCOUNTER — Emergency Department (HOSPITAL_COMMUNITY): Payer: Medicare (Managed Care)

## 2020-02-13 ENCOUNTER — Encounter (HOSPITAL_COMMUNITY): Payer: Self-pay | Admitting: Emergency Medicine

## 2020-02-13 ENCOUNTER — Emergency Department (HOSPITAL_COMMUNITY)
Admission: EM | Admit: 2020-02-13 | Discharge: 2020-02-13 | Disposition: A | Discharge status: Home / Self Care | Payer: Medicare (Managed Care) | Attending: Emergency Medicine | Admitting: Emergency Medicine

## 2020-02-13 ENCOUNTER — Other Ambulatory Visit: Payer: Self-pay

## 2020-02-13 DIAGNOSIS — Z930 Tracheostomy status: Secondary | ICD-10-CM | POA: Insufficient documentation

## 2020-02-13 DIAGNOSIS — N183 Chronic kidney disease, stage 3 unspecified: Secondary | ICD-10-CM | POA: Insufficient documentation

## 2020-02-13 DIAGNOSIS — Z79899 Other long term (current) drug therapy: Secondary | ICD-10-CM | POA: Diagnosis not present

## 2020-02-13 DIAGNOSIS — I252 Old myocardial infarction: Secondary | ICD-10-CM | POA: Diagnosis not present

## 2020-02-13 DIAGNOSIS — I5032 Chronic diastolic (congestive) heart failure: Secondary | ICD-10-CM | POA: Insufficient documentation

## 2020-02-13 DIAGNOSIS — Z20822 Contact with and (suspected) exposure to covid-19: Secondary | ICD-10-CM | POA: Diagnosis not present

## 2020-02-13 DIAGNOSIS — R0602 Shortness of breath: Secondary | ICD-10-CM

## 2020-02-13 DIAGNOSIS — Z87891 Personal history of nicotine dependence: Secondary | ICD-10-CM | POA: Diagnosis not present

## 2020-02-13 DIAGNOSIS — Z950 Presence of cardiac pacemaker: Secondary | ICD-10-CM | POA: Insufficient documentation

## 2020-02-13 DIAGNOSIS — R05 Cough: Secondary | ICD-10-CM | POA: Diagnosis not present

## 2020-02-13 DIAGNOSIS — I130 Hypertensive heart and chronic kidney disease with heart failure and stage 1 through stage 4 chronic kidney disease, or unspecified chronic kidney disease: Secondary | ICD-10-CM | POA: Insufficient documentation

## 2020-02-13 DIAGNOSIS — R062 Wheezing: Secondary | ICD-10-CM | POA: Insufficient documentation

## 2020-02-13 DIAGNOSIS — R059 Cough, unspecified: Secondary | ICD-10-CM

## 2020-02-13 LAB — CBC WITH DIFFERENTIAL/PLATELET
Abs Immature Granulocytes: 0.02 10*3/uL (ref 0.00–0.07)
Basophils Absolute: 0 10*3/uL (ref 0.0–0.1)
Basophils Relative: 1 %
Eosinophils Absolute: 0.1 10*3/uL (ref 0.0–0.5)
Eosinophils Relative: 2 %
HCT: 34.6 % — ABNORMAL LOW (ref 36.0–46.0)
Hemoglobin: 9.9 g/dL — ABNORMAL LOW (ref 12.0–15.0)
Immature Granulocytes: 0 %
Lymphocytes Relative: 28 %
Lymphs Abs: 1.3 10*3/uL (ref 0.7–4.0)
MCH: 26.5 pg (ref 26.0–34.0)
MCHC: 28.6 g/dL — ABNORMAL LOW (ref 30.0–36.0)
MCV: 92.5 fL (ref 80.0–100.0)
Monocytes Absolute: 0.7 10*3/uL (ref 0.1–1.0)
Monocytes Relative: 16 %
Neutro Abs: 2.4 10*3/uL (ref 1.7–7.7)
Neutrophils Relative %: 53 %
Platelets: 349 10*3/uL (ref 150–400)
RBC: 3.74 MIL/uL — ABNORMAL LOW (ref 3.87–5.11)
RDW: 19.8 % — ABNORMAL HIGH (ref 11.5–15.5)
WBC: 4.6 10*3/uL (ref 4.0–10.5)
nRBC: 0.4 % — ABNORMAL HIGH (ref 0.0–0.2)

## 2020-02-13 LAB — POCT I-STAT EG7
Acid-Base Excess: 4 mmol/L — ABNORMAL HIGH (ref 0.0–2.0)
Bicarbonate: 27.7 mmol/L (ref 20.0–28.0)
Calcium, Ion: 1.28 mmol/L (ref 1.15–1.40)
HCT: 35 % — ABNORMAL LOW (ref 36.0–46.0)
Hemoglobin: 11.9 g/dL — ABNORMAL LOW (ref 12.0–15.0)
O2 Saturation: 97 %
Patient temperature: 98.7
Potassium: 4 mmol/L (ref 3.5–5.1)
Sodium: 137 mmol/L (ref 135–145)
TCO2: 29 mmol/L (ref 22–32)
pCO2, Ven: 36 mmHg — ABNORMAL LOW (ref 44.0–60.0)
pH, Ven: 7.495 — ABNORMAL HIGH (ref 7.250–7.430)
pO2, Ven: 84 mmHg — ABNORMAL HIGH (ref 32.0–45.0)

## 2020-02-13 LAB — COMPREHENSIVE METABOLIC PANEL
ALT: 17 U/L (ref 0–44)
AST: 27 U/L (ref 15–41)
Albumin: 3.4 g/dL — ABNORMAL LOW (ref 3.5–5.0)
Alkaline Phosphatase: 65 U/L (ref 38–126)
Anion gap: 14 (ref 5–15)
BUN: 21 mg/dL (ref 8–23)
CO2: 23 mmol/L (ref 22–32)
Calcium: 10 mg/dL (ref 8.9–10.3)
Chloride: 100 mmol/L (ref 98–111)
Creatinine, Ser: 1.49 mg/dL — ABNORMAL HIGH (ref 0.44–1.00)
GFR calc Af Amer: 39 mL/min — ABNORMAL LOW (ref >60)
GFR calc non Af Amer: 34 mL/min — ABNORMAL LOW (ref >60)
Glucose, Bld: 106 mg/dL — ABNORMAL HIGH (ref 70–99)
Potassium: 4.3 mmol/L (ref 3.5–5.1)
Sodium: 137 mmol/L (ref 135–145)
Total Bilirubin: 0.8 mg/dL (ref 0.3–1.2)
Total Protein: 6.3 g/dL — ABNORMAL LOW (ref 6.5–8.1)

## 2020-02-13 LAB — LIPASE, BLOOD: Lipase: 22 U/L (ref 11–51)

## 2020-02-13 LAB — SARS CORONAVIRUS 2 (TAT 6-24 HRS): SARS Coronavirus 2: NEGATIVE

## 2020-02-13 LAB — TROPONIN I (HIGH SENSITIVITY): Troponin I (High Sensitivity): 191 ng/L (ref <18)

## 2020-02-13 MED ORDER — ASPIRIN 81 MG PO CHEW
324.0000 mg | CHEWABLE_TABLET | Freq: Once | ORAL | Status: DC
  Administered 2020-02-13: 324 mg via ORAL
  Filled 2020-02-13: qty 4

## 2020-02-13 MED ORDER — LORAZEPAM 2 MG/ML IJ SOLN
1.0000 mg | Freq: Once | INTRAMUSCULAR | Status: AC
  Administered 2020-02-13: 03:00:00 1 mg via INTRAVENOUS
  Filled 2020-02-13: qty 1

## 2020-02-13 NOTE — Progress Notes (Signed)
Pt complaining of SOB Upon RT arrival SPO2 100% BBS clear/diminished. ETCO2 +. Pt bagged and lavaged because she felt like she had secretions minimal amount of white secretions noted. Pt on 50% TC.  Blood gas obtained 7.49/35/84/37 97%. CXR pending. RT will continue to monitor

## 2020-02-13 NOTE — ED Triage Notes (Signed)
Patient reports tracheostomy problem , SOB , " can't get enough air" this evening , occasional dry cough , denies fever .

## 2020-02-13 NOTE — ED Provider Notes (Signed)
[facility] Provider Note   CSN: [id number] Arrival date & time: 02/13/20  0136     History Chief Complaint  Patient presents with  . Tracheostomy /SOB   Level 5 caveat due to respiratory distress [name] is a 74 y.o. female.  The history is provided by the patient and a relative.  Shortness of Breath Severity:  Severe Onset quality:  Gradual Timing:  Constant Progression:  Worsening Chronicity:  Recurrent Relieved by:  Nothing Worsened by:  Nothing  Patient with history of CHF, heart block with pacemaker, laryngeal stenosis with trach in place presents with shortness of breath.  Patient reports increasing feeling of shortness of breath and cannot get enough air.  She also reports dry cough.  She also reports a sensation of pain in her chest and abdomen.   Son provides further history via phone.  He reports the patient felt that the trach was very dry and she was unable to expectorate any sputum.  No hemoptysis in the past 24 hours.  Son also reports that patient appeared to be very anxious.  She is scheduled to have an outpatient chest x-ray later today    Past Medical History:  Diagnosis Date  . Anemia   . Angina   . Angioedema    2/2 ACE  . Arteriovenous malformation of stomach   . Arthritis   . Asthma   . AVM (arteriovenous malformation) of colon    small intestine; stomach  . Blood transfusion   . CHF (congestive heart failure) ([place])   . Complete heart block ([place])    s/p PPM 1998  . Complication of anesthesia    Difficult airway; anaphylaxis and swelling with propofol  . DDD (degenerative disc disease)   . Depression   . Diastolic heart failure   . Fatty liver 07/26/10  . GERD (gastroesophageal reflux disease)   . GI bleed   . Headache   . History of alcohol abuse Stopped Fall 2012  . History of tobacco use Quit Fall 2012  . Hx of cardiovascular stress test    a. Lexiscan Myoview (10/15):  Small  inferolateral and apical defect c/w scar and poss soft tissue attenuation, no ischemia, EF 42%  . Hx of colonic polyp 08/13/10   adenomatous  . Hx of colonoscopy   . Hyperlipidemia   . Hyperlipidemia   . Hypertension   . Hypertrophic cardiomyopathy ([place])    dx by Dr [name] 2009  . Iron deficiency anemia   . Myocardial infarction ([place])   . Panic attack   . Panic attacks   . Permanent atrial fibrillation ([place])   . Pneumonia   . Renal failure    baseline creatinine 1.6  . Right arm pain 01/08/2012  . RLS (restless legs syndrome)    Dx 06/2007  . Shortness of breath    sob on exertation  . Sleep apnea     Patient Active Problem List   Diagnosis Date Noted  . Cough with hemoptysis 08/02/2019  . SOB (shortness of breath)   . Atypical chest pain   . Chest pain on exertion 05/15/2019  . Cerebral embolism with cerebral infarction 05/09/2019  . Expressive aphasia 05/08/2019  . Other abnormal glucose 11/26/2018  . Polypharmacy 06/25/2018  . Depression with anxiety   . Tracheostomy in place [facility])   . Tracheostomy care ([place])   . Calculus of gallbladder without cholecystitis without obstruction 12/24/2017  . Chronic cough 09/25/2017  . History of colonic  polyps   . Moderate protein-calorie malnutrition ([place]) 06/02/2017  . Chronic idiopathic constipation 03/19/2017  . 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 Permanent atrial fibrillation ([place])   . Laryngeal stenosis 09/18/2016  . Esophageal dysphagia   . Polymyalgia rheumatica ([place]) 04/10/2015  . Hypertrophic cardiomyopathy ([place]) 02/15/2015  . Eczema 09/18/2014  . Environmental allergies 09/18/2014  . Pre-diabetes 11/26/2013  . COPD, moderate ([place]) 11/01/2013  . Complete heart block ([place]) 04/03/2011  . Anemia 02/17/2011  . AVM (arteriovenous malformation) 02/13/2011  . PACEMAKER-[facility] 11/28/2010  . CKD (chronic kidney disease) stage 3, GFR 30-59 ml/min (H[facility]) 09/24/2010  . Acute on chronic congestive heart failure ([place]) 08/30/2010    . ARTHRITIS 07/24/2010  . Generalized anxiety disorder 07/23/2010  . Hyperlipidemia 02/11/2007  . Essential hypertension 02/11/2007  . APNEA, SLEEP 02/11/2007    Past Surgical History:  Procedure Laterality Date  . CARDIAC CATHETERIZATION    . CARDIAC CATHETERIZATION N/A 08/19/2016   Procedure: Right/Left Heart Cath and Coronary Angiography;  Surgeon: Jolaine Artist, MD;  Location: [facility] LAB;  Service: Cardiovascular;  Laterality: N/A;  . COLONOSCOPY WITH PROPOFOL N/A 06/16/2017   Procedure: COLONOSCOPY WITH PROPOFOL;  Surgeon: Mauri Pole, MD;  Location: [facility] ENDOS[facility];  Service: Endoscopy;  Laterality: N/A;  . DIRECT LARYNGOSCOPY N/A 09/18/2016   Procedure: DIRECT LARYNGOSCOPY, BRONCHOSCOPY, REMOVAL OF INTUBATION GRANULOMA;  Surgeon: [name], MD;  Location: [facility] OR;  Service: ENT;  Laterality: N/A;  . DIRECT LARYNGOSCOPY N/A 10/20/2016   Procedure: EXTUBATION AND FLEXIBLE LARYNGOSCOPE;  Surgeon: [name], MD;  Location: [facility];  Service: ENT;  Laterality: N/A;  . DIRECT LARYNGOSCOPY N/A 10/29/2016   Procedure: DIRECT LARYNGOSCOPY;  Surgeon: [name], MD;  Location: [facility] OR;  Service: ENT;  Laterality: N/A;  . ESOPHAGOGASTRODUODENOSCOPY  12/23/2011   Procedure: ESOPHAGOGASTRODUODENOSCOPY (EGD);  Surgeon: [name], MD;  Location: [facility];  Service: Endoscopy;  Laterality: N/A;  . ESOPHAGOGASTRODUODENOSCOPY (EGD) WITH PROPOFOL N/A 06/16/2017   Procedure: ESOPHAGOGASTRODUODENOSCOPY (EGD) WITH PROPOFOL;  Surgeon: Mauri Pole, MD;  Location: [facility];  Service: Endoscopy;  Laterality: N/A;  . EXTUBATION (ENDOTRACHEAL) IN OR N/A 07/21/2016   Procedure: EXTUBATION (ENDOTRACHEAL) IN OR;  Surgeon: [name], MD;  Location: [facility];  Service: ENT;  Laterality: N/A;  . GIVENS CAPSULE STUDY  12/23/2011   Procedure: GIVENS CAPSULE STUDY;  Surgeon: [name], MD;  Location: [facility];  Service: Endoscopy;  Laterality: N/A;  . MICROLARYNGOSCOPY WITH  LASER N/A 10/16/2016   Procedure: MICRODIRECTLARYNGOSCOPY WITH LASER ABLATION AND KENLOG INJECTION;  Surgeon: [name], MD;  Location: [facility];  Service: ENT;  Laterality: N/A;  . PACEMAKER INSERTION  1998   st jude, most recent gen change by [name] 4/12  . TRACHEOSTOMY TUBE PLACEMENT N/A 10/29/2016   Procedure: TRACHEOSTOMY;  Surgeon: [name], MD;  Location: [facility];  Service: ENT;  Laterality: N/A;  . TUBAL LIGATION  04/01/2000     OB History   No obstetric history on file.     Family History  Problem Relation Age of Onset  . Hypertension Mother   . Stroke Mother   . Heart disease Mother   . Aneurysm Mother   . CVA Mother   . Hypertension Father   . Stroke Father   . Heart attack Father   . Alcohol abuse Father   . Hypertension Sister   . Hypertension Brother   . Colon cancer Brother   . Cancer Brother   . Diabetes Sister   .  Diabetes Brother   . Heart attack Brother   . Heart attack Brother   . Heart disease Sister   . Alcohol abuse Brother   . Heart disease Daughter     Social History   Tobacco Use  . Smoking status: Former Smoker    Packs/day: 0.10    Years: 50.00    Pack years: 5.00    Types: Cigarettes    Quit date: 08/16/2011    Years since quitting: 8.5  . Smokeless tobacco: Never Used  . Tobacco comment: quit 2012  Substance Use Topics  . Alcohol use: No    Comment: quit 08/16/2011  . Drug use: No    Home Medications Prior to Admission medications   Medication Sig Start Date End Date Taking? Authorizing Provider  acetaminophen (TYLENOL) 650 MG CR tablet Take 650-1,300 mg by mouth every 8 (eight) hours as needed for pain.    [provider]  albuterol (PROVENTIL HFA;VENTOLIN HFA) 108 (90 Base) MCG/ACT inhaler Inhale 1-2 puffs into the lungs every 6 (six) hours as needed for wheezing or shortness of breath. 08/04/18   Riccio, Gardiner Rhyme, DO  allopurinol (ZYLOPRIM) 100 MG tablet TAKE 2 TABLETS(200 MG) BY MOUTH DAILY Patient taking differently:  Take 200 mg by mouth daily.  05/18/19   Steve Rattler, DO  ALPRAZolam (XANAX) 0.5 MG tablet Take 1 tablet (0.5 mg total) by mouth at bedtime as needed for anxiety. 08/15/19   [name], [name], DO  apixaban (ELIQUIS) 5 MG TABS tablet Take 1 tablet (5 mg total) by mouth 2 (two) times daily. 06/06/19   Skeet Latch, MD  azithromycin (ZITHROMAX) 250 MG tablet Take 1 tablet (250 mg total) by mouth daily. Take first 2 tablets together, then 1 every day until finished. 01/23/20   Suzy Bouchard, PA-C  bimatoprost (LUMIGAN) 0.01 % SOLN Place 1 drop into both eyes at bedtime.    [provider]  cetirizine (ZYRTEC) 10 MG tablet Take 10 mg by mouth daily. prn    [provider]  Cholecalciferol (VITAMIN D) 50 MCG (2000 UT) tablet Take 2,000 Units by mouth daily.    [provider]  donepezil (ARICEPT ODT) 10 MG disintegrating tablet Take 10 mg by mouth at bedtime. Take with 5 mg to equal a total dose of 15 mg    [provider]  donepezil (ARICEPT ODT) 5 MG disintegrating tablet Take 5 mg by mouth at bedtime. Pt takes with 10 mg to equal a total dose of 15 mg    [provider]  Epoetin Alfa-epbx (RETACRIT IJ) Inject as directed.    [provider]  ferrous sulfate 325 (65 FE) MG tablet TAKE 1 TABLET BY MOUTH TWICE DAILY WITH MEALS TO KEEP BLOOD COUNT UP Patient taking differently: Take 325 mg by mouth 2 (two) times daily with a meal.  08/26/16   Riccio, Gardiner Rhyme, DO  fexofenadine (ALLEGRA) 180 MG tablet Take 180 mg by mouth daily.    [provider]  furosemide (LASIX) 20 MG tablet Take 1 tablet (20 mg total) by mouth daily. Daily as needed for weight gain of 2lbs in a day or 5lbs in a week 12/14/19   Skeet Latch, MD  furosemide (LASIX) 40 MG tablet Take 1 tablet (40 mg total) by mouth 2 (two) times daily. 12/14/19   Skeet Latch, MD  guaiFENesin (MUCINEX) 600 MG 12 hr tablet Take 1 tablet (600 mg total) by mouth 2 (two) times daily  as needed for to  loosen phlegm. 12/13/19   Charlesetta Shanks, MD  ipratropium-albuterol (DUONEB) 0.5-2.5 (3) MG/3ML SOLN USE 3 ML VIA NEBULIZER EVERY 6 HOURS Patient taking differently: Take 3 mLs by nebulization every 6 (six) hours as needed (shortness of breath).  10/21/18   Steve Rattler, DO  loratadine (CLARITIN) 10 MG tablet Take 1 tablet (10 mg total) by mouth daily. One po daily x 5 days Patient not taking: Reported on 01/23/2020 12/13/19   Charlesetta Shanks, MD  magnesium hydroxide (MILK OF MAGNESIA) 400 MG/5ML suspension Take 60 mLs by mouth daily as needed for mild constipation. prn    [provider]  metoprolol tartrate (LOPRESSOR) 25 MG tablet Take 1 tablet (25 mg total) by mouth 2 (two) times daily. 09/30/19   Skeet Latch, MD  nitroGLYCERIN (NITROSTAT) 0.4 MG SL tablet Place 1 tablet (0.4 mg total) under the tongue every 5 (five) minutes as needed for chest pain. 08/04/18   Steve Rattler, DO  Olopatadine HCl 0.2 % SOLN Place 1-2 drops into both eyes daily as needed (itching).  02/17/18   [provider]  oxymetazoline (AFRIN NASAL SPRAY) 0.05 % nasal spray Place 1 spray into both nostrils 2 (two) times daily. Patient not taking: Reported on 01/23/2020 05/21/19   Hall-Potvin, [place], PA-C  polyethylene glycol (MIRALAX / GLYCOLAX) packet Take 17 g by mouth daily as needed for mild constipation. 11/26/18   Steve Rattler, DO  potassium chloride (K-DUR) 10 MEQ tablet Take 1 tablet (10 mEq total) by mouth daily. 08/19/19   [name], [name], DO  predniSONE (DELTASONE) 20 MG tablet 2 tabs po daily x 4 days 01/21/20   Sherwood Gambler, MD  predniSONE (DELTASONE) 5 MG tablet Take 5 mg by mouth daily with breakfast.    [provider]  rosuvastatin (CRESTOR) 20 MG tablet Take 1 tablet (20 mg total) by mouth at bedtime. 08/19/19   [name], [name], DO  sodium chloride (OCEAN) 0.65 % SOLN nasal spray Place 1 spray into both nostrils daily as needed for congestion.     [provider]  triamcinolone cream (KENALOG) 0.1 % Apply 1 application topically 2 (two) times daily. Patient taking differently: Apply 1 application topically 2 (two) times daily as needed (rash).  06/03/18   Zenia Resides, MD    Allergies    Ace inhibitors and Other  Review of Systems   Review of Systems  Unable to perform ROS: Severe respiratory distress  Respiratory: Positive for shortness of breath.     Physical Exam Updated Vital Signs BP 140/84 (BP Location: Right Arm)   Pulse 68   Temp 98 F (36.7 C) (Oral)   Resp 18   SpO2 98%   Physical Exam CONSTITUTIONAL: Well developed, anxious HEAD: Normocephalic/atraumatic EYES: EOMI/PERRL ENMT: Mucous membranes moist NECK: Trach in place.  No blood or discharge noted.  No stridor SPINE/BACK:entire spine nontender CV: S1/S2 noted, no murmurs/rubs/gallops noted LUNGS: Tachypneic, clear lung breath sounds bilaterally ABDOMEN: soft, nontender, no rebound or guarding, bowel sounds noted throughout abdomen GU:no cva tenderness NEURO: Pt is awake/alert/appropriate, moves all extremitiesx4.  No facial droop.   EXTREMITIES: pulses normal/equal, full ROM SKIN: warm, color normal PSYCH: Anxious ED Results / Procedures / Treatments   Labs (all labs ordered are listed, but only abnormal results are displayed) Labs Reviewed  CBC WITH DIFFERENTIAL/PLATELET - Abnormal; Notable for the following components:      Result Value   RBC 3.74 (*)    Hemoglobin 9.9 (*)    HCT  34.6 (*)    MCHC 28.6 (*)    RDW 19.8 (*)    nRBC 0.4 (*)    All other components within normal limits  COMPREHENSIVE METABOLIC PANEL - Abnormal; Notable for the following components:   Glucose, Bld 106 (*)    Creatinine, Ser 1.49 (*)    Total Protein 6.3 (*)    Albumin 3.4 (*)    GFR calc non Af Amer 34 (*)    GFR calc Af Amer 39 (*)    All other components within normal limits  POCT I-STAT EG7 - Abnormal; Notable for the following components:   pH, Ven  7.495 (*)    pCO2, Ven 36.0 (*)    pO2, Ven 84.0 (*)    Acid-Base Excess 4.0 (*)    HCT 35.0 (*)    Hemoglobin 11.9 (*)    All other components within normal limits  TROPONIN I (HIGH SENSITIVITY) - Abnormal; Notable for the following components:   Troponin I (High Sensitivity) 191 (*)    All other components within normal limits  SARS CORONAVIRUS 2 (TAT 6-24 HRS)  LIPASE, BLOOD    EKG EKG Interpretation  Date/Time:  Monday February 13 2020 03:04:19 EST Ventricular Rate:  70 PR Interval:    QRS Duration: 217 QT Interval:  553 QTC Calculation: 597 R Axis:   -83 Text Interpretation: Electronic ventricular pacemaker IVCD, consider atypical RBBB LVH with IVCD, LAD and secondary repol abnrm Prolonged QT interval No significant change since last tracing Confirmed by Ripley Fraise [phone]) on 02/13/2020 3:24:26 AM   Radiology DG Chest Port 1 View  Result Date: 02/13/2020 CLINICAL DATA:  Shortness of breath EXAM: PORTABLE CHEST 1 VIEW COMPARISON:  02/02/2020 FINDINGS: Cardiac shadow remains enlarged. Pacing device is again seen. Tracheostomy tube is noted in satisfactory position. The lungs are well aerated bilaterally. Mild vascular congestion is noted without interstitial edema. No focal infiltrate is seen. No bony abnormality is noted. IMPRESSION: Mild vascular congestion.  No other focal abnormality is noted. Electronically Signed   By: Inez Catalina M.D.   On: 02/13/2020 02:32    Procedures Procedures   Medications Ordered in ED Medications  LORazepam (ATIVAN) injection 1 mg (1 mg Intravenous Given 02/13/20 0245)    ED Course  I have reviewed the triage vital signs and the nursing notes.  Pertinent labs & imaging results that were available during my care of the patient were reviewed by me and considered in my medical decision making (see chart for details).    MDM Rules/Calculators/A&P                      2:14 AM Patient presented with shortness of breath as well as  concerned about her trach.  No signs of hemoptysis or mucus discharge.  Vital signs are appropriate.  X-ray and labs are pending at this time 3:20 AM Overall patient is improving.  She appears more calm.  Vitals are appropriate. 3:35 AM Troponin is elevated.  However when I review previous troponins she appears to have a chronically elevated troponin.  June 2020 reveals troponin at that time that would have been calculated at approximately 130 EKG reveals baseline pacemaker with no acute change.  Cardiac cath in 2017 revealed normal coronaries I have low suspicion for ACS at this time 4:46 AM Patient feels improved.  She is resting comfortably.  No hypoxia.  No acute distress.  No new pain complaints.  I feel she is appropriate for discharge  home.  She has otolaryngology follow-up later this week.  Discussed the case with her son via phone.  Patient is requesting medicines for anxiety, but she is already on Xanax at home Son will come pick her up. Final Clinical Impression(s) / ED Diagnoses Final diagnoses:  Cough  Shortness of breath    Rx / DC Orders ED Discharge Orders    None       Ripley Fraise, MD 02/13/20 [phone]

## 2020-02-14 ENCOUNTER — Ambulatory Visit: Payer: Medicare (Managed Care) | Admitting: Cardiovascular Disease

## 2020-03-04 NOTE — Progress Notes (Signed)
Cardiology Clinic Note   Patient Name: [name] Date of Encounter: 03/05/2020  Primary Care Provider:  [name], [name], DO Primary Cardiologist:  Skeet Latch, MD  Patient Profile    [name] 74 year old female presents today for follow-up evaluation of her essential hypertension, acute on chronic CHF, complete heart block status post pacemaker (St Jude), hypertrophic cardiomyopathy, permanent atrial fibrillation, CKD, HLD, shortness of breath and COPD.  Past Medical History    Past Medical History:  Diagnosis Date  . Anemia   . Angina   . Angioedema    2/2 ACE  . Arteriovenous malformation of stomach   . Arthritis   . Asthma   . AVM (arteriovenous malformation) of colon    small intestine; stomach  . Blood transfusion   . CHF (congestive heart failure) ([place])   . Complete heart block ([place])    s/p PPM 1998  . Complication of anesthesia    Difficult airway; anaphylaxis and swelling with propofol  . DDD (degenerative disc disease)   . Depression   . Diastolic heart failure   . Fatty liver 07/26/10  . GERD (gastroesophageal reflux disease)   . GI bleed   . Headache   . History of alcohol abuse Stopped Fall 2012  . History of tobacco use Quit Fall 2012  . Hx of cardiovascular stress test    a. Lexiscan Myoview (10/15):  Small inferolateral and apical defect c/w scar and poss soft tissue attenuation, no ischemia, EF 42%  . Hx of colonic polyp 08/13/10   adenomatous  . Hx of colonoscopy   . Hyperlipidemia   . Hyperlipidemia   . Hypertension   . Hypertrophic cardiomyopathy ([place])    dx by Dr [name] 2009  . Iron deficiency anemia   . Myocardial infarction ([place])   . Panic attack   . Panic attacks   . Permanent atrial fibrillation ([place])   . Pneumonia   . Renal failure    baseline creatinine 1.6  . Right arm pain 01/08/2012  . RLS (restless legs syndrome)    Dx 06/2007  . Shortness of breath    sob on exertation  . Sleep apnea    Past  Surgical History:  Procedure Laterality Date  . CARDIAC CATHETERIZATION    . CARDIAC CATHETERIZATION N/A 08/19/2016   Procedure: Right/Left Heart Cath and Coronary Angiography;  Surgeon: Jolaine Artist, MD;  Location: [facility];  Service: Cardiovascular;  Laterality: N/A;  . COLONOSCOPY WITH PROPOFOL N/A 06/16/2017   Procedure: COLONOSCOPY WITH PROPOFOL;  Surgeon: Mauri Pole, MD;  Location: [facility];  Service: Endoscopy;  Laterality: N/A;  . DIRECT LARYNGOSCOPY N/A 09/18/2016   Procedure: DIRECT LARYNGOSCOPY, BRONCHOSCOPY, REMOVAL OF INTUBATION GRANULOMA;  Surgeon: [name], MD;  Location: [facility] OR;  Service: ENT;  Laterality: N/A;  . DIRECT LARYNGOSCOPY N/A 10/20/2016   Procedure: EXTUBATION AND FLEXIBLE LARYNGOSCOPE;  Surgeon: [name], MD;  Location: [place];  Service: ENT;  Laterality: N/A;  . DIRECT LARYNGOSCOPY N/A 10/29/2016   Procedure: DIRECT LARYNGOSCOPY;  Surgeon: [name], MD;  Location: [facility];  Service: ENT;  Laterality: N/A;  . ESOPHAGOGASTRODUODENOSCOPY  12/23/2011   Procedure: ESOPHAGOGASTRODUODENOSCOPY (EGD);  Surgeon: [name], MD;  Location: [facility];  Service: Endoscopy;  Laterality: N/A;  . ESOPHAGOGASTRODUODENOSCOPY (EGD) WITH PROPOFOL N/A 06/16/2017   Procedure: ESOPHAGOGASTRODUODENOSCOPY (EGD) WITH PROPOFOL;  Surgeon: Mauri Pole, MD;  Location: [facility];  Service: Endoscopy;  Laterality: N/A;  . EXTUBATION (ENDOTRACHEAL) IN OR N/A 07/21/2016  Procedure: EXTUBATION (ENDOTRACHEAL) IN OR;  Surgeon: [name], MD;  Location: [place];  Service: ENT;  Laterality: N/A;  . GIVENS CAPSULE STUDY  12/23/2011   Procedure: GIVENS CAPSULE STUDY;  Surgeon: [name], MD;  Location: [facility];  Service: Endoscopy;  Laterality: N/A;  . MICROLARYNGOSCOPY WITH LASER N/A 10/16/2016   Procedure: MICRODIRECTLARYNGOSCOPY WITH LASER ABLATION AND KENLOG INJECTION;  Surgeon: [name], MD;  Location: [place];  Service: ENT;  Laterality: N/A;    . PACEMAKER INSERTION  1998   st jude, most recent gen change by [name] 4/12  . TRACHEOSTOMY TUBE PLACEMENT N/A 10/29/2016   Procedure: TRACHEOSTOMY;  Surgeon: [name], MD;  Location: [place];  Service: ENT;  Laterality: N/A;  . TUBAL LIGATION  04/01/2000    Allergies  Allergies  Allergen Reactions  . Ace Inhibitors Swelling    angiodema  . Other Anaphylaxis and Swelling    Reaction to unspecified anesthesia     History of Present Illness   [name] was initially seen and evaluated on December 2016 as a referral from EP.  She was referred to establish general cardiovascular care.  She was noted to be admitted to the hospital earlier in the month with complaints of chest pain and was mildly fluid volume overloaded.  Her BNP at that time was 436 and her echocardiogram showed normal systolic function with grade 2 diastolic dysfunction.  She was given IV diuresis and developed acute renal failure.  Her troponins were mildly elevated at 0.19.  A Lexiscan was negative for ischemia.  She continued to struggle with palpitations and dizziness.  She has been in a chronic A. fib and was not initially on anticoagulation due to history of GI bleed in 2012.  She also had known AVMs.  She was referred for a watchman device 7/17.  This was complicated by acute respiratory distress during the TEE which ultimately required intubation.  She developed acute biventricular failure that eventually normalized.  She was discharged to a SNF and was subsequently readmitted due to fluid volume overload.  She is struggled with laryngeal stenosis that is managed by an ENT physician in [place].  She is not considered a candidate for removal of her tracheostomy due to residual stenosis.  She was admitted to the hospital with CVA 5/20 and has residual expressive aphasia.  She was subsequently started on Eliquis despite her history of GI bleed.  The following month she noted blood in her stool however, her H&H  remained stable.  She was referred to nephrology for her acute on chronic renal failure.  She is noted to struggle with words at times which is frustrating to her.  She is also noted to have occasional left breast pain that is nonexertional.  She was noted to have shortness of breath with minimal exertion and had been exercising more regularly to improve her endurance.  She has attended pace twice weekly in the past.  She was last seen by [name] on 12/14/2019.  During that time she was doing well.  Metoprolol had been added due to HCM, palpitations and dyspnea.  This had improved her symptoms and she was noticing fewer episodes of palpitations.  She indicated that she still had episodes of shortness of breath with activity.  She denied chest pain, orthopnea, PND and pressure.  She was noted to have mild lower extremity edema.  She indicated that she had presented to the emergency department on 12/29 due to bleeding around her trach  and chest congestion.  She indicated that she had increased secretions but denied fever and chills.  Her chest x-ray at that time showed no acute significant processes.  Her BNP was elevated at 347.  She presents the clinic today for further evaluation and states she is concerned about her medications.  She presents with her daughter and daughter states that she is not getting all of her medications at pace and a controlled environment.  Her medications come in a blister pack which are then distributed in her medication cups and administered 2 times per day.  She has been intermittently participating in some of the group activities and has lost some weight.  Her daughter is concerned about her weight loss however her BMI is 22.7.  I have encouraged her to increase her caloric intake and maintain her p.o. fluid consumption.  Overall she is doing well from a cardiac standpoint and has no complaints of palpitations.  She has noticed brief periods of chest discomfort that lasts for  seconds and then dissipate.  I will have her follow-up in 3 months.  Today she denies chest pain, shortness of breath, lower extremity edema, fatigue, palpitations, melena, hematuria, hemoptysis, diaphoresis, weakness, presyncope, syncope, orthopnea, and PND.   Home Medications    Prior to Admission medications   Medication Sig Start Date End Date Taking? Authorizing Provider  acetaminophen (TYLENOL) 650 MG CR tablet Take 650-1,300 mg by mouth every 8 (eight) hours as needed for pain.    [provider]  albuterol (PROVENTIL HFA;VENTOLIN HFA) 108 (90 Base) MCG/ACT inhaler Inhale 1-2 puffs into the lungs every 6 (six) hours as needed for wheezing or shortness of breath. 08/04/18   Riccio, Gardiner Rhyme, DO  allopurinol (ZYLOPRIM) 100 MG tablet TAKE 2 TABLETS(200 MG) BY MOUTH DAILY Patient taking differently: Take 200 mg by mouth daily.  05/18/19   Steve Rattler, DO  ALPRAZolam (XANAX) 0.5 MG tablet Take 1 tablet (0.5 mg total) by mouth at bedtime as needed for anxiety. 08/15/19   [name], [name], DO  apixaban (ELIQUIS) 5 MG TABS tablet Take 1 tablet (5 mg total) by mouth 2 (two) times daily. 06/06/19   Skeet Latch, MD  azithromycin (ZITHROMAX) 250 MG tablet Take 1 tablet (250 mg total) by mouth daily. Take first 2 tablets together, then 1 every day until finished. 01/23/20   Suzy Bouchard, PA-C  bimatoprost (LUMIGAN) 0.01 % SOLN Place 1 drop into both eyes at bedtime.    [provider]  cetirizine (ZYRTEC) 10 MG tablet Take 10 mg by mouth daily. prn    [provider]  Cholecalciferol (VITAMIN D) 50 MCG (2000 UT) tablet Take 2,000 Units by mouth daily.    [provider]  donepezil (ARICEPT ODT) 10 MG disintegrating tablet Take 10 mg by mouth at bedtime. Take with 5 mg to equal a total dose of 15 mg    [provider]  donepezil (ARICEPT ODT) 5 MG disintegrating tablet Take 5 mg by mouth at bedtime. Pt takes with 10 mg to equal a total dose of 15 mg     [provider]  Epoetin Alfa-epbx (RETACRIT IJ) Inject as directed.    [provider]  ferrous sulfate 325 (65 FE) MG tablet TAKE 1 TABLET BY MOUTH TWICE DAILY WITH MEALS TO KEEP BLOOD COUNT UP Patient taking differently: Take 325 mg by mouth 2 (two) times daily with a meal.  08/26/16   Riccio, Gardiner Rhyme, DO  fexofenadine (ALLEGRA) 180 MG  tablet Take 180 mg by mouth daily.    [provider]  furosemide (LASIX) 20 MG tablet Take 1 tablet (20 mg total) by mouth daily. Daily as needed for weight gain of 2lbs in a day or 5lbs in a week 12/14/19   Skeet Latch, MD  furosemide (LASIX) 40 MG tablet Take 1 tablet (40 mg total) by mouth 2 (two) times daily. 12/14/19   Skeet Latch, MD  guaiFENesin (MUCINEX) 600 MG 12 hr tablet Take 1 tablet (600 mg total) by mouth 2 (two) times daily as needed for to loosen phlegm. 12/13/19   Charlesetta Shanks, MD  ipratropium-albuterol (DUONEB) 0.5-2.5 (3) MG/3ML SOLN USE 3 ML VIA NEBULIZER EVERY 6 HOURS Patient taking differently: Take 3 mLs by nebulization every 6 (six) hours as needed (shortness of breath).  10/21/18   Steve Rattler, DO  loratadine (CLARITIN) 10 MG tablet Take 1 tablet (10 mg total) by mouth daily. One po daily x 5 days Patient not taking: Reported on 01/23/2020 12/13/19   Charlesetta Shanks, MD  magnesium hydroxide (MILK OF MAGNESIA) 400 MG/5ML suspension Take 60 mLs by mouth daily as needed for mild constipation. prn    [provider]  metoprolol tartrate (LOPRESSOR) 25 MG tablet Take 1 tablet (25 mg total) by mouth 2 (two) times daily. 09/30/19   Skeet Latch, MD  nitroGLYCERIN (NITROSTAT) 0.4 MG SL tablet Place 1 tablet (0.4 mg total) under the tongue every 5 (five) minutes as needed for chest pain. 08/04/18   Steve Rattler, DO  Olopatadine HCl 0.2 % SOLN Place 1-2 drops into both eyes daily as needed (itching).  02/17/18   [provider]  oxymetazoline (AFRIN NASAL SPRAY) 0.05 % nasal spray  Place 1 spray into both nostrils 2 (two) times daily. Patient not taking: Reported on 01/23/2020 05/21/19   Hall-Potvin, [place], PA-C  polyethylene glycol (MIRALAX / GLYCOLAX) packet Take 17 g by mouth daily as needed for mild constipation. 11/26/18   Steve Rattler, DO  potassium chloride (K-DUR) 10 MEQ tablet Take 1 tablet (10 mEq total) by mouth daily. 08/19/19   [name], [name], DO  predniSONE (DELTASONE) 20 MG tablet 2 tabs po daily x 4 days 01/21/20   Sherwood Gambler, MD  predniSONE (DELTASONE) 5 MG tablet Take 5 mg by mouth daily with breakfast.    [provider]  rosuvastatin (CRESTOR) 20 MG tablet Take 1 tablet (20 mg total) by mouth at bedtime. 08/19/19   [name], [name], DO  sodium chloride (OCEAN) 0.65 % SOLN nasal spray Place 1 spray into both nostrils daily as needed for congestion.     [provider]  triamcinolone cream (KENALOG) 0.1 % Apply 1 application topically 2 (two) times daily. Patient taking differently: Apply 1 application topically 2 (two) times daily as needed (rash).  06/03/18   Zenia Resides, MD    Family History    Family History  Problem Relation Age of Onset  . Hypertension Mother   . Stroke Mother   . Heart disease Mother   . Aneurysm Mother   . CVA Mother   . Hypertension Father   . Stroke Father   . Heart attack Father   . Alcohol abuse Father   . Hypertension Sister   . Hypertension Brother   . Colon cancer Brother   . Cancer Brother   . Diabetes Sister   . Diabetes Brother   . Heart attack Brother   . Heart attack Brother   . Heart disease  Sister   . Alcohol abuse Brother   . Heart disease Daughter    She indicated that her mother is deceased. She indicated that her father is deceased. She indicated that three of her six sisters are alive. She indicated that two of her five brothers are alive. She indicated that her maternal grandmother is deceased. She indicated that her maternal grandfather is deceased. She indicated that  her paternal grandmother is deceased. She indicated that her paternal grandfather is deceased. She indicated that all of her three daughters are alive. She indicated that her son is alive.  Social History    Social History   Socioeconomic History  . Marital status: Divorced    Spouse name: Not on file  . Number of children: 4  . Years of education: 9  . Highest education level: Not on file  Occupational History  . Occupation: disabled    Fish farm manager: UNEMPLOYED  . Occupation: previously- Development worker, community  Tobacco Use  . Smoking status: Former Smoker    Packs/day: 0.10    Years: 50.00    Pack years: 5.00    Types: Cigarettes    Quit date: 08/16/2011    Years since quitting: 8.5  . Smokeless tobacco: Never Used  . Tobacco comment: quit 2012  Substance and Sexual Activity  . Alcohol use: No    Comment: quit 08/16/2011  . Drug use: No  . Sexual activity: Never  Other Topics Concern  . Not on file  Social History Narrative   On disability for heart failure/pacemaker.     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    4 grown children,  8 grandchildren.     Drinks alcohol a few times a week socially. At one time, the most she reports drinking is about 3 mixed drinks.   Lives with daughter [name] and son. Will be moving 04/2011 to different house because concerned children are taking advantage of her financial situation.    No illicit drugs.   Smokes few cigarettes daily, especially when stressed. Started smoking @ age 56. Quit January 2012 2/2 health but started smoking again 02/2011.       Health Care POA:    Emergency Contact: daughter, Verdene Lennert [phone]   End of Life Plan: gave pt AD info pamphlet   Who lives with you: no one- lives in section 8 housing   Any pets: none   Diet: Pt has a varied diet of protein, starch and vegetables   Exercise: Pt has no regular exercise routine.   Seatbelts: Pt reports wearing seatbelt when in vehicles.    Hobbies: dancing    Social Determinants of Health   Financial Resource Strain:   . Difficulty of Paying Living Expenses:   Food Insecurity:   . Worried About Charity fundraiser in the Last Year:   . Arboriculturist in the Last Year:   Transportation Needs:   . Film/video editor (Medical):   [name] Lack of Transportation (Non-Medical):   Physical Activity:   . Days of Exercise per Week:   . Minutes of Exercise per Session:   Stress:   . Feeling of Stress :   Social Connections:   . Frequency of Communication with Friends and Family:   . Frequency of Social Gatherings with Friends and Family:   . Attends Religious Services:   . Active Member of Clubs or Organizations:   . Attends Archivist Meetings:   [name] Marital Status:   Intimate  Partner Violence:   . Fear of Current or Ex-Partner:   . Emotionally Abused:   [name] Physically Abused:   . Sexually Abused:      Review of Systems    General:  No chills, fever, night sweats or weight changes.  Cardiovascular:  No chest pain, dyspnea on exertion, edema, orthopnea, palpitations, paroxysmal nocturnal dyspnea. Dermatological: No rash, lesions/masses Respiratory: No cough, dyspnea Urologic: No hematuria, dysuria Abdominal:   No nausea, vomiting, diarrhea, bright red blood per rectum, melena, or hematemesis Neurologic:  No visual changes, wkns, changes in mental status. All other systems reviewed and are otherwise negative except as noted above.  Physical Exam    VS:  BP 116/70 (BP Location: Left Arm, Patient Position: Sitting, Cuff Size: Normal)   Pulse 76   Temp (!) 97.2 F (36.2 C)   Wt 141 lb (64 kg)   BMI 22.76 kg/m  , BMI Body mass index is 22.76 kg/m. GEN: Well nourished, well developed, in no acute distress. HEENT: normal. Neck: Supple, no JVD, carotid bruits, or masses. Cardiac: RRR, no murmurs, rubs, or gallops. No clubbing, cyanosis, edema.  Radials/DP/PT 2+ and equal bilaterally.  Respiratory:  Respirations regular and  unlabored, clear to auscultation bilaterally. GI: Soft, nontender, nondistended, BS + x 4. MS: no deformity or atrophy. Skin: warm and dry, no rash. Neuro:  Strength and sensation are intact. Psych: Normal affect.  Accessory Clinical Findings    ECG personally reviewed by me today-none today   Echocardiogram 05/09/2019  IMPRESSIONS   1. The left ventricle has hyperdynamic systolic function, with an ejection fraction of >65%. The cavity size was normal. There is severely increased left ventricular wall thickness. Findings are consistent with hypertrophic cardiomyopathy. Left  ventricular diastolic Doppler parameters are consistent with pseudonormalization. 2. The right ventricle has normal systolic function. The cavity was normal. There is no increase in right ventricular wall thickness. 3. Left atrial size was moderately dilated. 4. Right atrial size was moderately dilated. 5. Mitral valve regurgitation is moderate by color flow Doppler. 6. The aortic valve is tricuspid. Mild thickening of the aortic valve. Mild calcification of the aortic valve.   ICD Interrogation 01/25/16:  St. Just single chamber pacemaker Battery 2.78 V Presenting rhythm is atrial fibrillation Intermittently paced at 70 bpm. Underlying rhythm atrial fibrillation with complete heart block Threshold: 1V @ 0.50ms Sensing: not performed due to complete heart block. Impedance: 436 Ohms   Assessment & Plan   1.  HCM/hypertension -BP today 116/70.metoprolol previously added due to dyspnea and palpitations which showed some improvement in symptoms. Continue metoprolol 25 mg twice daily Heart healthy low-sodium diet Increase physical activity as tolerated  Hyperlipidemia-LDL 56 05/09/2019 Continue rosuvastatin 20 mg daily Heart healthy low-sodium high-fiber diet Increase physical activity as tolerated-continue to participate in group exercise  Chronic diastolic CHF-euvolemic today, no increased work  of breathing.  Echocardiogram 5/20 showed EF greater than 65%. Continue furosemide 20 mg daily Continue metoprolol 25 mg twice daily   Chronic atrial fibrillation/complete heart block -heart rate today 76 PPM placed 1998 (Saint Jude) CHA2DS2-VASc 2 score 6 Continue Eliquis Followed by EP  Disposition: Follow-up with [name] in 3 months.  Jossie Ng. [facility] [address] Office [phone] Fax [phone]

## 2020-03-05 ENCOUNTER — Other Ambulatory Visit: Payer: Self-pay

## 2020-03-05 ENCOUNTER — Encounter: Payer: Self-pay | Admitting: General Practice

## 2020-03-05 ENCOUNTER — Ambulatory Visit (INDEPENDENT_AMBULATORY_CARE_PROVIDER_SITE_OTHER): Payer: Medicare (Managed Care) | Admitting: General Practice

## 2020-03-05 VITALS — BP 116/70 | HR 76 | Temp 97.2°F | Wt 141.0 lb

## 2020-03-05 DIAGNOSIS — I442 Atrioventricular block, complete: Secondary | ICD-10-CM

## 2020-03-05 DIAGNOSIS — I482 Chronic atrial fibrillation, unspecified: Secondary | ICD-10-CM

## 2020-03-05 DIAGNOSIS — E7800 Pure hypercholesterolemia, unspecified: Secondary | ICD-10-CM | POA: Diagnosis not present

## 2020-03-05 DIAGNOSIS — I422 Other hypertrophic cardiomyopathy: Secondary | ICD-10-CM

## 2020-03-05 DIAGNOSIS — I10 Essential (primary) hypertension: Secondary | ICD-10-CM | POA: Diagnosis not present

## 2020-03-05 DIAGNOSIS — I5032 Chronic diastolic (congestive) heart failure: Secondary | ICD-10-CM

## 2020-03-05 NOTE — Patient Instructions (Signed)
Medication Instructions:  The current medical regimen is effective;  continue present plan and medications as directed. Please refer to the Current Medication list given to you today.  *If you need a refill on your cardiac medications before your next appointment, please call your pharmacy*  Other Instructions PLEASE INCREASE PHYSICAL ACTIVITY AS TOLERATED  INCREASE CALORIC INTAKE   Follow-Up: Your next appointment:  3 month(s)  In Person with Skeet Latch, MD  At [facility], you and your health needs are our priority.  As part of our continuing mission to provide you with exceptional heart care, we have created designated Provider Care Teams.  These Care Teams include your primary Cardiologist (physician) and Advanced Practice Providers (APPs -  Physician Assistants and Nurse Practitioners) who all work together to provide you with the care you need, when you need it.  We recommend signing up for the patient portal called "MyChart".  Sign up information is provided on this After Visit Summary.  MyChart is used to connect with patients for Virtual Visits (Telemedicine).  Patients are able to view lab/test results, encounter notes, upcoming appointments, etc.  Non-urgent messages can be sent to your provider as well.   To learn more about what you can do with MyChart, go to [email or web address].

## 2020-04-22 ENCOUNTER — Inpatient Hospital Stay (HOSPITAL_COMMUNITY)
Admission: EM | Admit: 2020-04-22 | Discharge: 2020-04-23 | DRG: 683 | Disposition: A | Discharge status: Home / Self Care | Payer: Medicare (Managed Care) | Attending: Family Medicine | Admitting: Family Medicine

## 2020-04-22 ENCOUNTER — Other Ambulatory Visit: Payer: Self-pay

## 2020-04-22 ENCOUNTER — Encounter (HOSPITAL_COMMUNITY): Payer: Self-pay | Admitting: Emergency Medicine

## 2020-04-22 DIAGNOSIS — M353 Polymyalgia rheumatica: Secondary | ICD-10-CM | POA: Diagnosis present

## 2020-04-22 DIAGNOSIS — E860 Dehydration: Secondary | ICD-10-CM | POA: Diagnosis present

## 2020-04-22 DIAGNOSIS — Z7952 Long term (current) use of systemic steroids: Secondary | ICD-10-CM

## 2020-04-22 DIAGNOSIS — N179 Acute kidney failure, unspecified: Secondary | ICD-10-CM | POA: Diagnosis not present

## 2020-04-22 DIAGNOSIS — Z930 Tracheostomy status: Secondary | ICD-10-CM

## 2020-04-22 DIAGNOSIS — F410 Panic disorder [episodic paroxysmal anxiety] without agoraphobia: Secondary | ICD-10-CM | POA: Diagnosis present

## 2020-04-22 DIAGNOSIS — D649 Anemia, unspecified: Secondary | ICD-10-CM | POA: Diagnosis present

## 2020-04-22 DIAGNOSIS — I4821 Permanent atrial fibrillation: Secondary | ICD-10-CM | POA: Diagnosis present

## 2020-04-22 DIAGNOSIS — Z7951 Long term (current) use of inhaled steroids: Secondary | ICD-10-CM

## 2020-04-22 DIAGNOSIS — Z87891 Personal history of nicotine dependence: Secondary | ICD-10-CM

## 2020-04-22 DIAGNOSIS — Z79899 Other long term (current) drug therapy: Secondary | ICD-10-CM

## 2020-04-22 DIAGNOSIS — F0390 Unspecified dementia without behavioral disturbance: Secondary | ICD-10-CM | POA: Diagnosis present

## 2020-04-22 DIAGNOSIS — Z7901 Long term (current) use of anticoagulants: Secondary | ICD-10-CM

## 2020-04-22 DIAGNOSIS — R5383 Other fatigue: Secondary | ICD-10-CM

## 2020-04-22 DIAGNOSIS — E878 Other disorders of electrolyte and fluid balance, not elsewhere classified: Secondary | ICD-10-CM | POA: Diagnosis present

## 2020-04-22 DIAGNOSIS — Z20822 Contact with and (suspected) exposure to covid-19: Secondary | ICD-10-CM | POA: Diagnosis present

## 2020-04-22 DIAGNOSIS — M109 Gout, unspecified: Secondary | ICD-10-CM | POA: Diagnosis present

## 2020-04-22 DIAGNOSIS — G2581 Restless legs syndrome: Secondary | ICD-10-CM | POA: Diagnosis present

## 2020-04-22 DIAGNOSIS — K219 Gastro-esophageal reflux disease without esophagitis: Secondary | ICD-10-CM | POA: Diagnosis present

## 2020-04-22 DIAGNOSIS — E871 Hypo-osmolality and hyponatremia: Secondary | ICD-10-CM | POA: Diagnosis present

## 2020-04-22 DIAGNOSIS — I110 Hypertensive heart disease with heart failure: Secondary | ICD-10-CM | POA: Diagnosis present

## 2020-04-22 DIAGNOSIS — I5032 Chronic diastolic (congestive) heart failure: Secondary | ICD-10-CM | POA: Diagnosis present

## 2020-04-22 LAB — CBC
HCT: 28.8 % — ABNORMAL LOW (ref 36.0–46.0)
Hemoglobin: 8.3 g/dL — ABNORMAL LOW (ref 12.0–15.0)
MCH: 25.1 pg — ABNORMAL LOW (ref 26.0–34.0)
MCHC: 28.8 g/dL — ABNORMAL LOW (ref 30.0–36.0)
MCV: 87 fL (ref 80.0–100.0)
Platelets: 492 10*3/uL — ABNORMAL HIGH (ref 150–400)
RBC: 3.31 MIL/uL — ABNORMAL LOW (ref 3.87–5.11)
RDW: 17.2 % — ABNORMAL HIGH (ref 11.5–15.5)
WBC: 6.7 10*3/uL (ref 4.0–10.5)
nRBC: 0 % (ref 0.0–0.2)

## 2020-04-22 LAB — COMPREHENSIVE METABOLIC PANEL
ALT: 46 U/L — ABNORMAL HIGH (ref 0–44)
AST: 71 U/L — ABNORMAL HIGH (ref 15–41)
Albumin: 3 g/dL — ABNORMAL LOW (ref 3.5–5.0)
Alkaline Phosphatase: 103 U/L (ref 38–126)
Anion gap: 10 (ref 5–15)
BUN: 37 mg/dL — ABNORMAL HIGH (ref 8–23)
CO2: 26 mmol/L (ref 22–32)
Calcium: 9.9 mg/dL (ref 8.9–10.3)
Chloride: 97 mmol/L — ABNORMAL LOW (ref 98–111)
Creatinine, Ser: 1.71 mg/dL — ABNORMAL HIGH (ref 0.44–1.00)
GFR calc Af Amer: 33 mL/min — ABNORMAL LOW (ref >60)
GFR calc non Af Amer: 29 mL/min — ABNORMAL LOW (ref >60)
Glucose, Bld: 134 mg/dL — ABNORMAL HIGH (ref 70–99)
Potassium: 4.2 mmol/L (ref 3.5–5.1)
Sodium: 133 mmol/L — ABNORMAL LOW (ref 135–145)
Total Bilirubin: 0.5 mg/dL (ref 0.3–1.2)
Total Protein: 6.8 g/dL (ref 6.5–8.1)

## 2020-04-22 LAB — LIPASE, BLOOD: Lipase: 28 U/L (ref 11–51)

## 2020-04-22 MED ORDER — SODIUM CHLORIDE 0.9% FLUSH: 3.0000 mL | Freq: Once | INTRAVENOUS | Status: DC

## 2020-04-22 NOTE — ED Triage Notes (Signed)
Pt c/o generalized abd pain with nausea since this morning/

## 2020-04-23 ENCOUNTER — Emergency Department (HOSPITAL_COMMUNITY): Payer: Medicare (Managed Care)

## 2020-04-23 DIAGNOSIS — Z7951 Long term (current) use of inhaled steroids: Secondary | ICD-10-CM | POA: Diagnosis not present

## 2020-04-23 DIAGNOSIS — M353 Polymyalgia rheumatica: Secondary | ICD-10-CM | POA: Diagnosis present

## 2020-04-23 DIAGNOSIS — Z87891 Personal history of nicotine dependence: Secondary | ICD-10-CM | POA: Diagnosis not present

## 2020-04-23 DIAGNOSIS — F0390 Unspecified dementia without behavioral disturbance: Secondary | ICD-10-CM | POA: Diagnosis present

## 2020-04-23 DIAGNOSIS — D649 Anemia, unspecified: Secondary | ICD-10-CM | POA: Diagnosis present

## 2020-04-23 DIAGNOSIS — Z7901 Long term (current) use of anticoagulants: Secondary | ICD-10-CM | POA: Diagnosis not present

## 2020-04-23 DIAGNOSIS — F410 Panic disorder [episodic paroxysmal anxiety] without agoraphobia: Secondary | ICD-10-CM | POA: Diagnosis present

## 2020-04-23 DIAGNOSIS — I110 Hypertensive heart disease with heart failure: Secondary | ICD-10-CM | POA: Diagnosis present

## 2020-04-23 DIAGNOSIS — N179 Acute kidney failure, unspecified: Secondary | ICD-10-CM | POA: Diagnosis present

## 2020-04-23 DIAGNOSIS — Z930 Tracheostomy status: Secondary | ICD-10-CM | POA: Diagnosis not present

## 2020-04-23 DIAGNOSIS — I5032 Chronic diastolic (congestive) heart failure: Secondary | ICD-10-CM | POA: Diagnosis present

## 2020-04-23 DIAGNOSIS — Z79899 Other long term (current) drug therapy: Secondary | ICD-10-CM | POA: Diagnosis not present

## 2020-04-23 DIAGNOSIS — M109 Gout, unspecified: Secondary | ICD-10-CM | POA: Diagnosis present

## 2020-04-23 DIAGNOSIS — K219 Gastro-esophageal reflux disease without esophagitis: Secondary | ICD-10-CM | POA: Diagnosis present

## 2020-04-23 DIAGNOSIS — E871 Hypo-osmolality and hyponatremia: Secondary | ICD-10-CM | POA: Diagnosis present

## 2020-04-23 DIAGNOSIS — E860 Dehydration: Secondary | ICD-10-CM | POA: Diagnosis present

## 2020-04-23 DIAGNOSIS — G2581 Restless legs syndrome: Secondary | ICD-10-CM | POA: Diagnosis present

## 2020-04-23 DIAGNOSIS — Z7952 Long term (current) use of systemic steroids: Secondary | ICD-10-CM | POA: Diagnosis not present

## 2020-04-23 DIAGNOSIS — E878 Other disorders of electrolyte and fluid balance, not elsewhere classified: Secondary | ICD-10-CM | POA: Diagnosis present

## 2020-04-23 DIAGNOSIS — I4821 Permanent atrial fibrillation: Secondary | ICD-10-CM | POA: Diagnosis present

## 2020-04-23 DIAGNOSIS — Z20822 Contact with and (suspected) exposure to covid-19: Secondary | ICD-10-CM | POA: Diagnosis present

## 2020-04-23 LAB — COMPREHENSIVE METABOLIC PANEL
ALT: 45 U/L — ABNORMAL HIGH (ref 0–44)
AST: 52 U/L — ABNORMAL HIGH (ref 15–41)
Albumin: 2.8 g/dL — ABNORMAL LOW (ref 3.5–5.0)
Alkaline Phosphatase: 105 U/L (ref 38–126)
Anion gap: 13 (ref 5–15)
BUN: 32 mg/dL — ABNORMAL HIGH (ref 8–23)
CO2: 25 mmol/L (ref 22–32)
Calcium: 9.8 mg/dL (ref 8.9–10.3)
Chloride: 97 mmol/L — ABNORMAL LOW (ref 98–111)
Creatinine, Ser: 1.43 mg/dL — ABNORMAL HIGH (ref 0.44–1.00)
GFR calc Af Amer: 41 mL/min — ABNORMAL LOW (ref >60)
GFR calc non Af Amer: 35 mL/min — ABNORMAL LOW (ref >60)
Glucose, Bld: 95 mg/dL (ref 70–99)
Potassium: 3.9 mmol/L (ref 3.5–5.1)
Sodium: 135 mmol/L (ref 135–145)
Total Bilirubin: 1 mg/dL (ref 0.3–1.2)
Total Protein: 6 g/dL — ABNORMAL LOW (ref 6.5–8.1)

## 2020-04-23 LAB — TROPONIN I (HIGH SENSITIVITY)
Troponin I (High Sensitivity): 190 ng/L (ref <18)
Troponin I (High Sensitivity): 210 ng/L (ref <18)
Troponin I (High Sensitivity): 198 ng/L (ref <18)

## 2020-04-23 LAB — URINALYSIS, ROUTINE W REFLEX MICROSCOPIC
Bacteria, UA: NONE SEEN
Bilirubin Urine: NEGATIVE
Glucose, UA: NEGATIVE mg/dL
Hgb urine dipstick: NEGATIVE
Ketones, ur: NEGATIVE mg/dL
Leukocytes,Ua: NEGATIVE
Nitrite: NEGATIVE
Protein, ur: 100 mg/dL — AB
Specific Gravity, Urine: 1.018 (ref 1.005–1.030)
pH: 6 (ref 5.0–8.0)

## 2020-04-23 LAB — TYPE AND SCREEN
ABO/RH(D): O POS
Antibody Screen: NEGATIVE

## 2020-04-23 LAB — LACTIC ACID, PLASMA
Lactic Acid, Venous: 1 mmol/L (ref 0.5–1.9)
Lactic Acid, Venous: 0.8 mmol/L (ref 0.5–1.9)

## 2020-04-23 LAB — RESPIRATORY PANEL BY RT PCR (FLU A&B, COVID)
Influenza A by PCR: NEGATIVE
Influenza B by PCR: NEGATIVE
SARS Coronavirus 2 by RT PCR: NEGATIVE

## 2020-04-23 MED ORDER — SODIUM CHLORIDE 0.9 % IV BOLUS
250.0000 mL | Freq: Once | INTRAVENOUS | Status: AC
  Administered 2020-04-23: 01:00:00 250 mL via INTRAVENOUS

## 2020-04-23 MED ORDER — SODIUM CHLORIDE 0.9 % IV BOLUS: 500.0000 mL | Freq: Once | INTRAVENOUS | Status: DC

## 2020-04-23 MED ORDER — IOHEXOL 9 MG/ML PO SOLN
ORAL | Status: AC
  Filled 2020-04-23: qty 500

## 2020-04-23 MED ORDER — SODIUM CHLORIDE 0.9 % IV BOLUS
250.0000 mL | Freq: Once | INTRAVENOUS | Status: AC
  Administered 2020-04-23: 250 mL via INTRAVENOUS

## 2020-04-23 NOTE — Discharge Instructions (Addendum)
[name] was evaluated in the emergency room today for abdominal pain.  We did an ultrasound of her belly as well as a CT scan of her belly which showed that she does have some gallstones but no signs of infection.  This may need to be followed up if she has continued pain as an outpatient.  I have discussed with her primary care provider at [facility] and they will follow-up and continue to monitor this.  Patient was found to be a little dehydrated during her stay in the emergency room and was given fluids.  Please try to encourage patient to drink as many fluids as possible.  Please follow-up with her primary care provider at [facility] soon as you are able.  Do not hesitate to return if you have any further concerns.

## 2020-04-23 NOTE — ED Provider Notes (Signed)
[facility] EMERGENCY DEPARTMENT Provider Note   CSN: [id number] Arrival date & time: 04/22/20  2003     History Chief Complaint  Patient presents with  . Abdominal Pain    [name] is a 74 y.o. female with complicated medical history including dementia presents to the Emergency Department with her son after she was witnessed holding her abdomen like she was hurting this afternoon and evening.  Son reports that due to her dementia she has been unable to provide a history for approximately 6 months.  He reports that she recently had a minor stroke and they believe the dementia was related to this.  He also reports concerns about polypharmacy.  He reports between 3 and 4 weeks ago she was placed on an antidepressant and since that time has been sleeping most of the day.  He reports that she sleeps so much she is not eating or drinking much.  He reports concerns about this.  When he raised these concerns, the dose was lowered but the medication was not stopped.   Level 5 Caveat for Dementia.  History provided by the son and medical records.  The history is provided by a relative and medical records. The history is limited by the condition of the patient. No language interpreter was used.       Past Medical History:  Diagnosis Date  . Anemia   . Angina   . Angioedema    2/2 ACE  . Arteriovenous malformation of stomach   . Arthritis   . Asthma   . AVM (arteriovenous malformation) of colon    small intestine; stomach  . Blood transfusion   . CHF (congestive heart failure) ([place])   . Complete heart block ([place])    s/p PPM 1998  . Complication of anesthesia    Difficult airway; anaphylaxis and swelling with propofol  . DDD (degenerative disc disease)   . Depression   . Diastolic heart failure   . Fatty liver 07/26/10  . GERD (gastroesophageal reflux disease)   . GI bleed   . Headache   . History of alcohol abuse Stopped Fall 2012  . History of  tobacco use Quit Fall 2012  . Hx of cardiovascular stress test    a. Lexiscan Myoview (10/15):  Small inferolateral and apical defect c/w scar and poss soft tissue attenuation, no ischemia, EF 42%  . Hx of colonic polyp 08/13/10   adenomatous  . Hx of colonoscopy   . Hyperlipidemia   . Hyperlipidemia   . Hypertension   . Hypertrophic cardiomyopathy ([place])    dx by Dr [name] 2009  . Iron deficiency anemia   . Myocardial infarction ([place])   . Panic attack   . Panic attacks   . Permanent atrial fibrillation ([place])   . Pneumonia   . Renal failure    baseline creatinine 1.6  . Right arm pain 01/08/2012  . RLS (restless legs syndrome)    Dx 06/2007  . Shortness of breath    sob on exertation  . Sleep apnea     Patient Active Problem List   Diagnosis Date Noted  . Cough with hemoptysis 08/02/2019  . SOB (shortness of breath)   . Atypical chest pain   . Chest pain on exertion 05/15/2019  . Cerebral embolism with cerebral infarction 05/09/2019  . Expressive aphasia 05/08/2019  . Other abnormal glucose 11/26/2018  . Polypharmacy 06/25/2018  . Depression with anxiety   . Tracheostomy in place [facility])   .  Tracheostomy care ([place])   . Calculus of gallbladder without cholecystitis without obstruction 12/24/2017  . Chronic cough 09/25/2017  . History of colonic polyps   . Moderate protein-calorie malnutrition ([place]) 06/02/2017  . Chronic idiopathic constipation 03/19/2017  . 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 Permanent atrial fibrillation ([place])   . Laryngeal stenosis 09/18/2016  . Esophageal dysphagia   . Polymyalgia rheumatica ([place]) 04/10/2015  . Hypertrophic cardiomyopathy ([place]) 02/15/2015  . Eczema 09/18/2014  . Environmental allergies 09/18/2014  . Pre-diabetes 11/26/2013  . COPD, moderate ([place]) 11/01/2013  . Complete heart block ([place]) 04/03/2011  . Anemia 02/17/2011  . AVM (arteriovenous malformation) 02/13/2011  . PACEMAKER-[place] 11/28/2010  . CKD (chronic kidney disease)  stage 3, GFR 30-59 ml/min (HCC) 09/24/2010  . Acute on chronic congestive heart failure ([place]) 08/30/2010  . ARTHRITIS 07/24/2010  . Generalized anxiety disorder 07/23/2010  . Hyperlipidemia 02/11/2007  . Essential hypertension 02/11/2007  . APNEA, SLEEP 02/11/2007    Past Surgical History:  Procedure Laterality Date  . CARDIAC CATHETERIZATION    . CARDIAC CATHETERIZATION N/A 08/19/2016   Procedure: Right/Left Heart Cath and Coronary Angiography;  Surgeon: Jolaine Artist, MD;  Location: [place] [facility];  Service: Cardiovascular;  Laterality: N/A;  . COLONOSCOPY WITH PROPOFOL N/A 06/16/2017   Procedure: COLONOSCOPY WITH PROPOFOL;  Surgeon: Mauri Pole, MD;  Location: [facility];  Service: Endoscopy;  Laterality: N/A;  . DIRECT LARYNGOSCOPY N/A 09/18/2016   Procedure: DIRECT LARYNGOSCOPY, BRONCHOSCOPY, REMOVAL OF INTUBATION GRANULOMA;  Surgeon: [name], MD;  Location: [facility] OR;  Service: ENT;  Laterality: N/A;  . DIRECT LARYNGOSCOPY N/A 10/20/2016   Procedure: EXTUBATION AND FLEXIBLE LARYNGOSCOPE;  Surgeon: [name], MD;  Location: [place];  Service: ENT;  Laterality: N/A;  . DIRECT LARYNGOSCOPY N/A 10/29/2016   Procedure: DIRECT LARYNGOSCOPY;  Surgeon: [name], MD;  Location: [facility] OR;  Service: ENT;  Laterality: N/A;  . ESOPHAGOGASTRODUODENOSCOPY  12/23/2011   Procedure: ESOPHAGOGASTRODUODENOSCOPY (EGD);  Surgeon: [name], MD;  Location: [facility];  Service: Endoscopy;  Laterality: N/A;  . ESOPHAGOGASTRODUODENOSCOPY (EGD) WITH PROPOFOL N/A 06/16/2017   Procedure: ESOPHAGOGASTRODUODENOSCOPY (EGD) WITH PROPOFOL;  Surgeon: Mauri Pole, MD;  Location: [facility];  Service: Endoscopy;  Laterality: N/A;  . EXTUBATION (ENDOTRACHEAL) IN OR N/A 07/21/2016   Procedure: EXTUBATION (ENDOTRACHEAL) IN OR;  Surgeon: [name], MD;  Location: [place];  Service: ENT;  Laterality: N/A;  . GIVENS CAPSULE STUDY  12/23/2011   Procedure: GIVENS CAPSULE STUDY;  Surgeon: [name], MD;  Location: [facility];  Service: Endoscopy;  Laterality: N/A;  . MICROLARYNGOSCOPY WITH LASER N/A 10/16/2016   Procedure: MICRODIRECTLARYNGOSCOPY WITH LASER ABLATION AND KENLOG INJECTION;  Surgeon: [name], MD;  Location: [place];  Service: ENT;  Laterality: N/A;  . PACEMAKER INSERTION  1998   st jude, most recent gen change by [name] 4/12  . TRACHEOSTOMY TUBE PLACEMENT N/A 10/29/2016   Procedure: TRACHEOSTOMY;  Surgeon: [name], MD;  Location: [place];  Service: ENT;  Laterality: N/A;  . TUBAL LIGATION  04/01/2000     OB History   No obstetric history on file.     Family History  Problem Relation Age of Onset  . Hypertension Mother   . Stroke Mother   . Heart disease Mother   . Aneurysm Mother   . CVA Mother   . Hypertension Father   . Stroke Father   . Heart attack Father   . Alcohol abuse Father   . Hypertension Sister   .  Hypertension Brother   . Colon cancer Brother   . Cancer Brother   . Diabetes Sister   . Diabetes Brother   . Heart attack Brother   . Heart attack Brother   . Heart disease Sister   . Alcohol abuse Brother   . Heart disease Daughter     Social History   Tobacco Use  . Smoking status: Former Smoker    Packs/day: 0.10    Years: 50.00    Pack years: 5.00    Types: Cigarettes    Quit date: 08/16/2011    Years since quitting: 8.6  . Smokeless tobacco: Never Used  . Tobacco comment: quit 2012  Substance Use Topics  . Alcohol use: No    Comment: quit 08/16/2011  . Drug use: No    Home Medications Prior to Admission medications   Medication Sig Start Date End Date Taking? Authorizing Provider  acetaminophen (TYLENOL) 650 MG CR tablet Take 650-1,300 mg by mouth every 8 (eight) hours as needed for pain.   Yes [provider]  albuterol (PROVENTIL HFA;VENTOLIN HFA) 108 (90 Base) MCG/ACT inhaler Inhale 1-2 puffs into the lungs every 6 (six) hours as needed for wheezing or shortness of breath. 08/04/18  Yes Riccio, Angela C,  DO  allopurinol (ZYLOPRIM) 100 MG tablet TAKE 2 TABLETS(200 MG) BY MOUTH DAILY Patient taking differently: Take 200 mg by mouth daily.  05/18/19   Steve Rattler, DO  ALPRAZolam (XANAX) 0.5 MG tablet Take 1 tablet (0.5 mg total) by mouth at bedtime as needed for anxiety. 08/15/19   [name], [name], DO  apixaban (ELIQUIS) 5 MG TABS tablet Take 1 tablet (5 mg total) by mouth 2 (two) times daily. 06/06/19   Skeet Latch, MD  azithromycin (ZITHROMAX) 250 MG tablet Take 1 tablet (250 mg total) by mouth daily. Take first 2 tablets together, then 1 every day until finished. 01/23/20   Suzy Bouchard, PA-C  bimatoprost (LUMIGAN) 0.01 % SOLN Place 1 drop into both eyes at bedtime.    [provider]  cetirizine (ZYRTEC) 10 MG tablet Take 10 mg by mouth daily. prn    [provider]  Cholecalciferol (VITAMIN D) 50 MCG (2000 UT) tablet Take 2,000 Units by mouth daily.    [provider]  donepezil (ARICEPT ODT) 10 MG disintegrating tablet Take 10 mg by mouth at bedtime. Take with 5 mg to equal a total dose of 15 mg    [provider]  donepezil (ARICEPT ODT) 5 MG disintegrating tablet Take 5 mg by mouth at bedtime. Pt takes with 10 mg to equal a total dose of 15 mg    [provider]  Epoetin Alfa-epbx (RETACRIT IJ) Inject as directed.    [provider]  ferrous sulfate 325 (65 FE) MG tablet TAKE 1 TABLET BY MOUTH TWICE DAILY WITH MEALS TO KEEP BLOOD COUNT UP Patient taking differently: Take 325 mg by mouth 2 (two) times daily with a meal.  08/26/16   Riccio, Gardiner Rhyme, DO  fexofenadine (ALLEGRA) 180 MG tablet Take 180 mg by mouth daily.    [provider]  furosemide (LASIX) 20 MG tablet Take 1 tablet (20 mg total) by mouth daily. Daily as needed for weight gain of 2lbs in a day or 5lbs in a week 12/14/19   Skeet Latch, MD  furosemide (LASIX) 40 MG tablet Take 1 tablet (40 mg total) by mouth 2 (two) times daily. 12/14/19   Skeet Latch, MD  guaiFENesin [facility])  600 MG 12 hr tablet Take 1 tablet (600 mg total) by mouth 2 (two) times daily as needed for to loosen phlegm. 12/13/19   Charlesetta Shanks, MD  ipratropium-albuterol (DUONEB) 0.5-2.5 (3) MG/3ML SOLN USE 3 ML VIA NEBULIZER EVERY 6 HOURS Patient taking differently: Take 3 mLs by nebulization every 6 (six) hours as needed (shortness of breath).  10/21/18   Steve Rattler, DO  loratadine (CLARITIN) 10 MG tablet Take 1 tablet (10 mg total) by mouth daily. One po daily x 5 days 12/13/19   Charlesetta Shanks, MD  magnesium hydroxide (MILK OF MAGNESIA) 400 MG/5ML suspension Take 60 mLs by mouth daily as needed for mild constipation. prn    [provider]  metoprolol tartrate (LOPRESSOR) 25 MG tablet Take 1 tablet (25 mg total) by mouth 2 (two) times daily. 09/30/19   Skeet Latch, MD  nitroGLYCERIN (NITROSTAT) 0.4 MG SL tablet Place 1 tablet (0.4 mg total) under the tongue every 5 (five) minutes as needed for chest pain. 08/04/18   Steve Rattler, DO  Olopatadine HCl 0.2 % SOLN Place 1-2 drops into both eyes daily as needed (itching).  02/17/18   [provider]  oxymetazoline (AFRIN NASAL SPRAY) 0.05 % nasal spray Place 1 spray into both nostrils 2 (two) times daily. 05/21/19   Hall-Potvin, [place], PA-C  polyethylene glycol (MIRALAX / GLYCOLAX) packet Take 17 g by mouth daily as needed for mild constipation. 11/26/18   Steve Rattler, DO  potassium chloride (K-DUR) 10 MEQ tablet Take 1 tablet (10 mEq total) by mouth daily. 08/19/19   [name], [name], DO  predniSONE (DELTASONE) 20 MG tablet 2 tabs po daily x 4 days 01/21/20   Sherwood Gambler, MD  predniSONE (DELTASONE) 5 MG tablet Take 5 mg by mouth daily with breakfast.    [provider]  rosuvastatin (CRESTOR) 20 MG tablet Take 1 tablet (20 mg total) by mouth at bedtime. 08/19/19   [name], [name], DO  sodium chloride (OCEAN) 0.65 % SOLN nasal spray Place 1 spray into both nostrils daily as needed  for congestion.     [provider]  triamcinolone cream (KENALOG) 0.1 % Apply 1 application topically 2 (two) times daily. Patient taking differently: Apply 1 application topically 2 (two) times daily as needed (rash).  06/03/18   Zenia Resides, MD    Allergies    Ace inhibitors and Other  Review of Systems   Review of Systems  Unable to perform ROS: Dementia  Gastrointestinal: Positive for abdominal pain.    Physical Exam Updated Vital Signs BP 102/61 (BP Location: Right Arm)   Pulse 70   Temp 98.3 F (36.8 C) (Oral)   Resp 19   Ht 5\' 6"  (1.676 m)   Wt 64 kg   SpO2 100%   BMI 22.77 kg/m   Physical Exam Vitals and nursing note reviewed.  Constitutional:      General: She is not in acute distress.    Appearance: She is not diaphoretic.  HENT:     Head: Normocephalic.     Mouth/Throat:     Comments: Mucous membranes are somewhat dry Eyes:     General: No scleral icterus.    Conjunctiva/sclera: Conjunctivae normal.  Neck:     Comments: Trach in place.  Site clean and dry. Cardiovascular:     Rate and Rhythm: Normal rate and regular rhythm.     Pulses: Normal pulses.          Radial pulses are 2+  on the right side and 2+ on the left side.  Pulmonary:     Effort: No tachypnea, accessory muscle usage, prolonged expiration, respiratory distress or retractions.     Breath sounds: Normal breath sounds. No stridor.     Comments: Equal chest rise. No increased work of breathing. Abdominal:     General: There is no distension.     Palpations: Abdomen is soft.     Tenderness: There is no abdominal tenderness. There is no guarding or rebound.     Comments: Abdomen soft and nontender on my exam.  Musculoskeletal:     Cervical back: Normal range of motion.     Comments: Moves all extremities equally and without difficulty.  Skin:    General: Skin is warm and dry.     Capillary Refill: Capillary refill takes less than 2 seconds.  Neurological:     Mental  Status: She is alert.     GCS: GCS eye subscore is 4. GCS verbal subscore is 5. GCS motor subscore is 6.     Comments: Speech is clear without evidence of aphasia.  Psychiatric:        Mood and Affect: Mood normal.     ED Results / Procedures / Treatments   Labs (all labs ordered are listed, but only abnormal results are displayed) Labs Reviewed  COMPREHENSIVE METABOLIC PANEL - Abnormal; Notable for the following components:      Result Value   Sodium 133 (*)    Chloride 97 (*)    Glucose, Bld 134 (*)    BUN 37 (*)    Creatinine, Ser 1.71 (*)    Albumin 3.0 (*)    AST 71 (*)    ALT 46 (*)    GFR calc non Af Amer 29 (*)    GFR calc Af Amer 33 (*)    All other components within normal limits  CBC - Abnormal; Notable for the following components:   RBC 3.31 (*)    Hemoglobin 8.3 (*)    HCT 28.8 (*)    MCH 25.1 (*)    MCHC 28.8 (*)    RDW 17.2 (*)    Platelets 492 (*)    All other components within normal limits  URINALYSIS, ROUTINE W REFLEX MICROSCOPIC - Abnormal; Notable for the following components:   Protein, ur 100 (*)    All other components within normal limits  TROPONIN I (HIGH SENSITIVITY) - Abnormal; Notable for the following components:   Troponin I (High Sensitivity) 190 (*)    All other components within normal limits  RESPIRATORY PANEL BY RT PCR (FLU A&B, COVID)  LIPASE, BLOOD  LACTIC ACID, PLASMA  LACTIC ACID, PLASMA  POC OCCULT BLOOD, ED  POC OCCULT BLOOD, ED  TYPE AND SCREEN  TROPONIN I (HIGH SENSITIVITY)    EKG EKG Interpretation  Date/Time:  Sunday Apr 22 2020 20:07:45 EDT Ventricular Rate:  70 PR Interval:    QRS Duration: 212 QT Interval:  526 QTC Calculation: 568 R Axis:   -75 Text Interpretation: Ventricular-paced rhythm Abnormal ECG Confirmed by Aletta Edouard [phone]) on 04/22/2020 8:22:53 PM   Radiology CT ABDOMEN PELVIS WO CONTRAST  Result Date: 04/23/2020 CLINICAL DATA:  Abdominal pain. Nonlocalized. EXAM: CT ABDOMEN AND PELVIS  WITHOUT CONTRAST TECHNIQUE: Multidetector CT imaging of the abdomen and pelvis was performed following the standard protocol without IV contrast. COMPARISON:  Right upper quadrant ultrasound 04/23/2020. CT of the abdomen and pelvis 07/19/2017. FINDINGS: Lower chest: The lung bases are clear  without focal nodule, mass, or airspace disease. Heart is enlarged. Pacing wires are stable. Hepatobiliary: The liver is unremarkable. Gallstones layer dependently. No inflammatory changes are present. The common bile duct is within normal limits. Pancreas: Unremarkable. No pancreatic ductal dilatation or surrounding inflammatory changes. Spleen: Normal in size without focal abnormality. Adrenals/Urinary Tract: Low-density exophytic lesions bilaterally demonstrate some interval growth, consistent with simple cysts. Stomach/Bowel: Stomach and duodenum are within normal limits. Small bowel is unremarkable. Terminal ileum is normal. The appendix is visualized and within normal limits. The ascending and transverse colon are within normal limits. The descending and sigmoid colon are normal. Vascular/Lymphatic: Atherosclerotic calcifications are present in the aorta and branch vessels. No aneurysm is present. No significant retroperitoneal adenopathy is present. Reproductive: Uterus and bilateral adnexa are unremarkable. Other: No abdominal wall hernia or abnormality. No abdominopelvic ascites. Musculoskeletal: Degenerative disc disease is greatest at L5-S1 with central and bilateral foraminal stenosis. Moderate to severe foraminal stenosis is present at L4-5 and L5-S1. No focal lytic or blastic lesions are present. Anatomic pelvis is within normal limits. Hips are located and normal. IMPRESSION: 1. No acute or focal lesion to explain the patient's abdominal pain. 2. Cholelithiasis without cholecystitis. 3. Aortic Atherosclerosis (ICD10-I70.0). Electronically Signed   By: San Morelle M.D.   On: 04/23/2020 06:28   US Abdomen  Limited  Result Date: 04/23/2020 CLINICAL DATA:  Elevated LFTs, right upper quadrant pain, nausea EXAM: ULTRASOUND ABDOMEN LIMITED RIGHT UPPER QUADRANT COMPARISON:  03/27/2018 FINDINGS: Gallbladder: Gallstones and biliary sludge are identified. Largest calculus measures 7 mm. No evidence of acute cholecystitis. No gallbladder wall thickening. Common bile duct: Diameter: 5 mm Liver: No focal lesion identified. Within normal limits in parenchymal echogenicity. Portal vein is patent on color Doppler imaging with normal direction of blood flow towards the liver. Other: Multiple simple right renal cysts are incidentally noted. No free fluid. IMPRESSION: 1. Cholelithiasis with gallbladder sludge. No evidence of cholecystitis. Electronically Signed   By: Randa Ngo M.D.   On: 04/23/2020 01:08   DG Chest Portable 1 View  Result Date: 04/23/2020 CLINICAL DATA:  Chest pain, abdominal pain, nausea EXAM: PORTABLE CHEST 1 VIEW COMPARISON:  02/13/2020 FINDINGS: Single frontal view of the chest demonstrates stable dual lead pacer overlying right chest. Tracheostomy unchanged. Cardiac silhouette is enlarged. No airspace disease, effusion, or pneumothorax. IMPRESSION: 1. Stable enlarged cardiac silhouette. 2. No acute airspace disease. Electronically Signed   By: Randa Ngo M.D.   On: 04/23/2020 01:51    Procedures Procedures (including critical care time)  Medications Ordered in ED Medications  sodium chloride flush (NS) 0.9 % injection 3 mL (0 mLs Intravenous Hold 04/22/20 2346)  iohexol (OMNIPAQUE) 9 MG/ML oral solution (has no administration in time range)  sodium chloride 0.9 % bolus 250 mL (0 mLs Intravenous Stopped 04/23/20 0203)    ED Course  I have reviewed the triage vital signs and the nursing notes.  Pertinent labs & imaging results that were available during my care of the patient were reviewed by me and considered in my medical decision making (see chart for details).  Clinical Course as of  Apr 24 703  Mon Apr 23, 2020  0004 AKI noted.  Creatinine(!): 1.71 [HM]  0051 Elevated AST and ALT.  Records report history of cholelithiasis without cholecystitis.  No evidence of cholecystectomy.  Will obtain ultrasound.  ALT(!): 46 [HM]  0052 Anemia with approximately 3 g drop over the last 2 months.  Patient is anticoagulated on Eliquis.  Hemoglobin(!): 8.3 [  HM]  0052 Hyponatremia and hypochloremia likely secondary to dehydration and poor oral intake.  Sodium(!): 133 [HM]  0053 Noted.  Likely secondary to dehydration and poor oral intake.  Given significant history of congestive heart failure, will start with 250 mL bolus.  BUN(!): 37 [HM]    Clinical Course User Index [HM] [facility], Gwenlyn Perking   MDM Rules/Calculators/A&P                      Patient presents with son with handful of issues.  Initially he is concerned about her abdomen as she was holding it today as if she was in pain.  Mildly elevated LFTs.  Ultrasound shows Coley lithiasis without evidence of cholecystitis.  Patient's abdomen is soft and nontender on my exam.  As she is unable to give much history, CT scan was obtained.  Personally evaluated these images.  No acute findings.  CT had to be done without contrast as she has acute on chronic kidney failure.  Denies chest pain or shortness of breath.  Troponin is elevated however appears to be baseline for patient.  Additionally, she has had a 3 g drop in her hemoglobin.  Brown stool on DRE; no gross blood.  Additionally son concerned about possible polypharmacy overdose.  Patient has started on a new medication and is sleeping most of the time.  She has minimal oral intake because of this.  Some element of dehydration.  Small fluid bolus given secondary to her significant congestive heart failure.  She will need admission for dehydration, AKI and medication management.  The patient was discussed with and seen by Dr. Wyvonnia Dusky who agrees with the treatment  plan.   Final Clinical Impression(s) / ED Diagnoses Final diagnoses:  Dehydration  AKI (acute kidney injury) ([place])  Anemia, unspecified type  Polypharmacy  Fatigue, unspecified type    Rx / DC Orders ED Discharge Orders    None       Lamyiah Crawshaw, Gwenlyn Perking 04/23/20 5615    Ezequiel Essex, MD 04/23/20 2054

## 2020-04-23 NOTE — Consult Note (Signed)
FPTS consult note  S: Patient is a 74 year old female who presented to the ED by son with abdominal pain.  PMH significant for dementia, HTN, HFpEF, complete heart block s/p pacemaker, tracheostomy in place, hypertrophic cardiomyopathy, permanent atrial fibrillation, CKD, gout, OSA, polymyalgia rheumatica, depression, HLD and COPD.  I was called for admission of patient for AKI as well as hemoglobin of 8.3 with declining dementia.  Upon evaluation of patient she states that she is not currently in any pain or discomfort.  She is simply confused as to why she is here.  She told me that I should call her son and daughter in order to discuss with them what is going on with her.  She states she is not having any fevers, chills, headache, change in vision, chest pain, shortness of breath, abdominal pain, nausea, vomiting, diarrhea, constipation, change in vision, weakness.  She reports [facility]  Spoke with PCP Cletis Athens, NP who states that patient has been declining over the past 2 months.  She states that she has not been participating in group activities as prior and has been staying in bed more.  She believes this is due to her declining status with her dementia.  They have been adjusting her depression medications at home in order to decrease her sedation.  She states that she believes she has been eating and drinking less at home and they recently increased her Ensure to 3 times daily.  Patient does live at home with son and daughter who take care of her but they do work during the day.  Primary care provider reports that patient can receive PT and OT eval's and treatment through [facility] ED and does not need to be admitted for evaluation for SNF as she does not believe that this will benefit her.  She states that she will have close follow-up with her.  Called and discussed with daughter [name] who reports that her brother had brought patient into the emergency room for  abdominal pain.  He is currently at work in [employer] that he had no other concerns other than her grabbing her stomach and complaining of pain intermittently.  She states that if all of her labs and everything is normal here then she could follow-up outpatient for management given no urgent need for surgery.  O: BP 113/63   Pulse 70   Temp 98.3 F (36.8 C) (Oral)   Resp 15   Ht 5\' 6"  (1.676 m)   Wt 64 kg   SpO2 100%   BMI 22.77 kg/m   General: NAD, pleasant Eyes: PERRL, EOMI, no conjunctival pallor or injection ENTM: Moist mucous membranes, no pharyngeal erythema or exudate Neck: Supple, no LAD Cardiovascular: RRR, no m/r/g, no LE edema Respiratory: CTA BL, normal work of breathing Gastrointestinal: soft, nontender, nondistended, normoactive BS MSK: moves 4 extremities equally Derm: no rashes appreciated Neuro: CN II-XII grossly intact Psych: AO to self and place (intermittently), appropriate affect  A/P: Discussed plan with attending and repeated high-sensitivity troponin to ensure that it has not increased from patient's baseline of ~190 (due to history of HFpEF and hypertrophic cardiomyopathy).  Repeat labs showed improvement in AST/ALT as well as improvement of troponin from 2 10-1 98 and patient currently without any abdominal pain or chest pain on exam.  Patient appears to be more euvolemic after receiving 500 mL of normal saline.  Plan is to discharge patient with close follow-up with [facility] who is also in agreement with patient being sent home from the emergency room.  Hemoglobin noted to be 8.3 here in the hospital but reported baseline of 8-9 and is currently being seen by nephrology who has patient on Retacrit injections.  Overall patient confused but not off of baseline per report of daughter and PCP so we will discharge her with close home follow-up.  Karlei Waldo, [name], DO 04/23/2020, 11:56 AM PGY-3, [facility] pager [phone]

## 2020-04-23 NOTE — ED Notes (Signed)
Patient verbalizes understanding of discharge instructions. Opportunity for questioning and answers were provided. Armband removed by staff, pt discharged from ED ambulatory.   

## 2020-04-23 NOTE — ED Notes (Signed)
Pt transported to US

## 2020-05-08 ENCOUNTER — Other Ambulatory Visit (HOSPITAL_COMMUNITY): Payer: Self-pay | Admitting: *Deleted

## 2020-05-09 ENCOUNTER — Ambulatory Visit (HOSPITAL_COMMUNITY)
Admission: RE | Admit: 2020-05-09 | Discharge: 2020-05-09 | Disposition: A | Discharge status: Home / Self Care | Payer: Medicare (Managed Care) | Source: Ambulatory Visit | Attending: Nephrology | Admitting: Nephrology

## 2020-05-09 ENCOUNTER — Other Ambulatory Visit: Payer: Self-pay

## 2020-05-09 DIAGNOSIS — D631 Anemia in chronic kidney disease: Secondary | ICD-10-CM | POA: Diagnosis not present

## 2020-05-09 DIAGNOSIS — N183 Chronic kidney disease, stage 3 unspecified: Secondary | ICD-10-CM | POA: Diagnosis present

## 2020-05-09 MED ORDER — SODIUM CHLORIDE 0.9 % IV SOLN
510.0000 mg | INTRAVENOUS | Status: DC
  Administered 2020-05-09: 510 mg via INTRAVENOUS
  Filled 2020-05-09: qty 17

## 2020-05-15 ENCOUNTER — Ambulatory Visit: Payer: Medicare (Managed Care) | Admitting: Cardiovascular Disease

## 2020-05-15 NOTE — Progress Notes (Deleted)
error 

## 2020-05-16 ENCOUNTER — Other Ambulatory Visit: Payer: Self-pay

## 2020-05-16 ENCOUNTER — Ambulatory Visit (HOSPITAL_COMMUNITY)
Admission: RE | Admit: 2020-05-16 | Discharge: 2020-05-16 | Disposition: A | Discharge status: Home / Self Care | Payer: Medicare (Managed Care) | Source: Ambulatory Visit | Attending: Nephrology | Admitting: Nephrology

## 2020-05-16 DIAGNOSIS — D631 Anemia in chronic kidney disease: Secondary | ICD-10-CM | POA: Diagnosis present

## 2020-05-16 MED ORDER — SODIUM CHLORIDE 0.9 % IV SOLN
510.0000 mg | INTRAVENOUS | Status: DC
  Administered 2020-05-16: 510 mg via INTRAVENOUS
  Filled 2020-05-16: qty 17

## 2020-05-21 ENCOUNTER — Encounter: Payer: Self-pay | Admitting: Nurse Practitioner

## 2020-05-29 ENCOUNTER — Other Ambulatory Visit (HOSPITAL_COMMUNITY): Payer: Self-pay

## 2020-05-30 ENCOUNTER — Other Ambulatory Visit (HOSPITAL_COMMUNITY): Payer: Self-pay | Admitting: *Deleted

## 2020-05-30 ENCOUNTER — Telehealth: Payer: Self-pay | Admitting: Hematology and Oncology

## 2020-05-30 NOTE — Telephone Encounter (Signed)
Received a new hem referral from [facility] for anemia. [name] has been scheduled to see Dr. Alvy Bimler on 6/22 at 1:30pm. Appointment date and time has been provided to the pt's son and faxed to the referring office.

## 2020-05-31 ENCOUNTER — Encounter: Payer: Self-pay | Admitting: Nurse Practitioner

## 2020-05-31 ENCOUNTER — Ambulatory Visit (INDEPENDENT_AMBULATORY_CARE_PROVIDER_SITE_OTHER): Payer: Medicare (Managed Care) | Admitting: Nurse Practitioner

## 2020-05-31 ENCOUNTER — Ambulatory Visit (HOSPITAL_COMMUNITY)
Admission: RE | Admit: 2020-05-31 | Discharge: 2020-05-31 | Disposition: A | Discharge status: Home / Self Care | Payer: Medicare (Managed Care) | Source: Ambulatory Visit | Attending: Nurse Practitioner | Admitting: Nurse Practitioner

## 2020-05-31 ENCOUNTER — Other Ambulatory Visit: Payer: Self-pay

## 2020-05-31 ENCOUNTER — Encounter: Payer: Medicare (Managed Care) | Admitting: Nurse Practitioner

## 2020-05-31 VITALS — BP 98/62 | HR 70 | Ht 66.0 in | Wt 135.2 lb

## 2020-05-31 DIAGNOSIS — I4821 Permanent atrial fibrillation: Secondary | ICD-10-CM

## 2020-05-31 DIAGNOSIS — D500 Iron deficiency anemia secondary to blood loss (chronic): Secondary | ICD-10-CM | POA: Insufficient documentation

## 2020-05-31 DIAGNOSIS — I10 Essential (primary) hypertension: Secondary | ICD-10-CM | POA: Diagnosis not present

## 2020-05-31 DIAGNOSIS — I442 Atrioventricular block, complete: Secondary | ICD-10-CM | POA: Diagnosis not present

## 2020-05-31 LAB — CUP PACEART INCLINIC DEVICE CHECK
Battery Impedance: 1700 Ohm
Battery Voltage: 2.76 V
Date Time Interrogation Session: 20210617091029
Implantable Lead Implant Date: 19860725
Implantable Lead Implant Date: 19860725
Implantable Lead Location: 753859
Implantable Lead Location: 753860
Implantable Lead Serial Number: 652511
Implantable Pulse Generator Implant Date: 20120424
Lead Channel Impedance Value: 353 Ohm
Lead Channel Pacing Threshold Amplitude: 1.25 V
Lead Channel Pacing Threshold Pulse Width: 0.5 ms
Lead Channel Setting Pacing Amplitude: 2.5 V
Lead Channel Setting Pacing Pulse Width: 0.5 ms
Lead Channel Setting Sensing Sensitivity: 8 mV
Pulse Gen Serial Number: 2406848

## 2020-05-31 LAB — PREPARE RBC (CROSSMATCH)

## 2020-05-31 NOTE — Progress Notes (Signed)
Electrophysiology Office Note Date: 05/31/2020  ID:  [name], DOB 02/05/1946, MRN [id number]  PCP[facility] [place] Primary Cardiologist: Oval Linsey Electrophysiologist: All[place]  CC: Pacemaker follow-up  [name] is a 74 y.o. female seen today for Dr Rayann Heman.  She presents today for routine electrophysiology followup.  She has had anemia recently and is undergoing work up. This has caused fatigue and some confusion. She is seen with her daughter today.  She denies chest pain, palpitations, dyspnea, PND, orthopnea, nausea, vomiting, dizziness, syncope, edema, weight gain, or early satiety.  Device History: STJ dual chamber PPM implanted 1986 for complete heart block; gen change 2004; gen change 2012   Past Medical History:  Diagnosis Date  . Anemia   . Angioedema    2/2 ACE  . Arteriovenous malformation of stomach   . Arthritis   . Asthma   . AVM (arteriovenous malformation) of colon    small intestine; stomach  . Blood transfusion   . CHF (congestive heart failure) ([place])   . Complete heart block ([place])    s/p PPM 1998  . Complication of anesthesia    Difficult airway; anaphylaxis and swelling with propofol  . DDD (degenerative disc disease)   . Depression   . Diastolic heart failure   . Fatty liver 07/26/10  . GERD (gastroesophageal reflux disease)   . GI bleed   . History of alcohol abuse Stopped Fall 2012  . History of tobacco use Quit Fall 2012  . Hx of cardiovascular stress test    a. Lexiscan Myoview (10/15):  Small inferolateral and apical defect c/w scar and poss soft tissue attenuation, no ischemia, EF 42%  . Hx of colonic polyp 08/13/10   adenomatous  . Hx of colonoscopy   . Hyperlipidemia   . Hypertension   . Hypertrophic cardiomyopathy ([place])    dx by Dr [name] 2009  . Iron deficiency anemia   . Myocardial infarction ([place])   . Panic attacks   . Permanent atrial fibrillation ([place])   . Renal failure     baseline creatinine 1.6  . Right arm pain 01/08/2012  . RLS (restless legs syndrome)    Dx 06/2007  . Sleep apnea    Past Surgical History:  Procedure Laterality Date  . CARDIAC CATHETERIZATION    . CARDIAC CATHETERIZATION N/A 08/19/2016   Procedure: Right/Left Heart Cath and Coronary Angiography;  Surgeon: Jolaine Artist, MD;  Location: [facility];  Service: Cardiovascular;  Laterality: N/A;  . COLONOSCOPY WITH PROPOFOL N/A 06/16/2017   Procedure: COLONOSCOPY WITH PROPOFOL;  Surgeon: Mauri Pole, MD;  Location: [facility];  Service: Endoscopy;  Laterality: N/A;  . DIRECT LARYNGOSCOPY N/A 09/18/2016   Procedure: DIRECT LARYNGOSCOPY, BRONCHOSCOPY, REMOVAL OF INTUBATION GRANULOMA;  Surgeon: [name], MD;  Location: [facility] OR;  Service: ENT;  Laterality: N/A;  . DIRECT LARYNGOSCOPY N/A 10/20/2016   Procedure: EXTUBATION AND FLEXIBLE LARYNGOSCOPE;  Surgeon: [name], MD;  Location: [facility];  Service: ENT;  Laterality: N/A;  . DIRECT LARYNGOSCOPY N/A 10/29/2016   Procedure: DIRECT LARYNGOSCOPY;  Surgeon: [name], MD;  Location[facility];  Service: ENT;  Laterality: N/A;  . ESOPHAGOGASTRODUODENOSCOPY  12/23/2011   Procedure: ESOPHAGOGASTRODUODENOSCOPY (EGD);  Surgeon: [name], MD;  Location: [facility];  Service: Endoscopy;  Laterality: N/A;  . ESOPHAGOGASTRODUODENOSCOPY (EGD) WITH PROPOFOL N/A 06/16/2017   Procedure: ESOPHAGOGASTRODUODENOSCOPY (EGD) WITH PROPOFOL;  Surgeon: Mauri Pole, MD;  Location: [facility];  Service: Endoscopy;  Laterality: N/A;  . EXTUBATION (ENDOTRACHEAL) IN OR N/A 07/21/2016   Procedure: EXTUBATION (ENDOTRACHEAL) IN OR;  Surgeon: [name], MD;  Location: [facility];  Service: ENT;  Laterality: N/A;  . GIVENS CAPSULE STUDY  12/23/2011   Procedure: GIVENS CAPSULE STUDY;  Surgeon: [name], MD;  Location: [facility];  Service: Endoscopy;  Laterality: N/A;  . MICROLARYNGOSCOPY WITH LASER N/A 10/16/2016   Procedure:  MICRODIRECTLARYNGOSCOPY WITH LASER ABLATION AND KENLOG INJECTION;  Surgeon: [name], MD;  Location: [facility];  Service: ENT;  Laterality: N/A;  . PACEMAKER INSERTION  1998   st jude, most recent gen change by [name] 4/12  . TRACHEOSTOMY TUBE PLACEMENT N/A 10/29/2016   Procedure: TRACHEOSTOMY;  Surgeon: [name], MD;  Location: [facility];  Service: ENT;  Laterality: N/A;  . TUBAL LIGATION  04/01/2000    Current Outpatient Medications  Medication Sig Dispense Refill  . acetaminophen (TYLENOL) 650 MG CR tablet Take 650-1,300 mg by mouth every 8 (eight) hours as needed for pain.    [name] albuterol (PROVENTIL HFA;VENTOLIN HFA) 108 (90 Base) MCG/ACT inhaler Inhale 1-2 puffs into the lungs every 6 (six) hours as needed for wheezing or shortness of breath. 1 Inhaler 6  . allopurinol (ZYLOPRIM) 100 MG tablet TAKE 2 TABLETS(200 MG) BY MOUTH DAILY 180 tablet 3  . ALPRAZolam (XANAX) 0.5 MG tablet Take 1 tablet (0.5 mg total) by mouth at bedtime as needed for anxiety. 30 tablet 0  . apixaban (ELIQUIS) 5 MG TABS tablet Take 1 tablet (5 mg total) by mouth 2 (two) times daily. 180 tablet 1  . cetirizine (ZYRTEC) 10 MG tablet Take 10 mg by mouth daily as needed for allergies.     . Cholecalciferol (VITAMIN D) 50 MCG (2000 UT) tablet Take 2,000 Units by mouth daily.    [name] Epoetin Alfa-epbx (RETACRIT IJ) Inject as directed once a week.     . ferrous sulfate 325 (65 FE) MG tablet TAKE 1 TABLET BY MOUTH TWICE DAILY WITH MEALS TO KEEP BLOOD COUNT UP 60 tablet 11  . furosemide (LASIX) 20 MG tablet Take 1 tablet (20 mg total) by mouth daily. Daily as needed for weight gain of 2lbs in a day or 5lbs in a week 30 tablet 6  . guaiFENesin (MUCINEX) 600 MG 12 hr tablet Take 1 tablet (600 mg total) by mouth 2 (two) times daily as needed for to loosen phlegm. 20 tablet 1  . ipratropium-albuterol (DUONEB) 0.5-2.5 (3) MG/3ML SOLN USE 3 ML VIA NEBULIZER EVERY 6 HOURS 360 mL 0  . latanoprost (XALATAN) 0.005 % ophthalmic solution Place  1 drop into both eyes at bedtime.    [name] loratadine (CLARITIN) 10 MG tablet Take 1 tablet (10 mg total) by mouth daily. One po daily x 5 days 14 tablet 0  . metoprolol tartrate (LOPRESSOR) 25 MG tablet Take 1 tablet (25 mg total) by mouth 2 (two) times daily. 60 tablet 5  . nitroGLYCERIN (NITROSTAT) 0.4 MG SL tablet Place 1 tablet (0.4 mg total) under the tongue every 5 (five) minutes as needed for chest pain. 25 tablet 1  . Olopatadine HCl 0.2 % SOLN Place 1-2 drops into both eyes daily as needed (itching).   1  . oxymetazoline (AFRIN NASAL SPRAY) 0.05 % nasal spray Place 1 spray into both nostrils 2 (two) times daily. (Patient taking differently: Place 1 spray into both nostrils as needed. ) 30 mL 0  . potassium chloride (K-DUR) 10 MEQ tablet Take 1 tablet (10 mEq  total) by mouth daily. 90 tablet 0  . rosuvastatin (CRESTOR) 20 MG tablet Take 1 tablet (20 mg total) by mouth at bedtime. 90 tablet 3  . sodium chloride (OCEAN) 0.65 % SOLN nasal spray Place 1 spray into both nostrils daily as needed for congestion.     [name] STIMULANT LAXATIVE 8.6-50 MG tablet Take 2 tablets by mouth at bedtime as needed for mild constipation.     . triamcinolone cream (KENALOG) 0.1 % Apply 1 application topically 2 (two) times daily. 30 g 2   No current facility-administered medications for this visit.    Allergies:   Ace inhibitors and Other   Social History: Social History   Socioeconomic History  . Marital status: Divorced    Spouse name: Not on file  . Number of children: 4  . Years of education: 9  . Highest education level: Not on file  Occupational History  . Occupation: disabled    Fish farm manager: UNEMPLOYED  . Occupation: previously- Development worker, community  Tobacco Use  . Smoking status: Former Smoker    Packs/day: 0.10    Years: 50.00    Pack years: 5.00    Types: Cigarettes    Quit date: 08/16/2011    Years since quitting: 8.7  . Smokeless tobacco: Never Used  . Tobacco comment: quit 2012  Vaping Use  .  Vaping Use: Never used  Substance and Sexual Activity  . Alcohol use: No    Comment: quit 08/16/2011  . Drug use: No  . Sexual activity: Never  Other Topics Concern  . Not on file  Social History Narrative   On disability for heart failure/pacemaker.     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    4 grown children,  8 grandchildren.     Drinks alcohol a few times a week socially. At one time, the most she reports drinking is about 3 mixed drinks.   Lives with daughter [name] and son. Will be moving 04/2011 to different house because concerned children are taking advantage of her financial situation.    No illicit drugs.   Smokes few cigarettes daily, especially when stressed. Started smoking @ age 82. Quit January 2012 2/2 health but started smoking again 02/2011.       Health Care POA:    Emergency Contact: daughter, Verdene Lennert [phone]   End of Life Plan: gave pt AD info pamphlet   Who lives with you: no one- lives in section 8 housing   Any pets: none   Diet: Pt has a varied diet of protein, starch and vegetables   Exercise: Pt has no regular exercise routine.   Seatbelts: Pt reports wearing seatbelt when in vehicles.    Hobbies: dancing   Social Determinants of Health   Financial Resource Strain:   . Difficulty of Paying Living Expenses:   Food Insecurity:   . Worried About Charity fundraiser in the Last Year:   . Arboriculturist in the Last Year:   Transportation Needs:   . Film/video editor (Medical):   [name] Lack of Transportation (Non-Medical):   Physical Activity:   . Days of Exercise per Week:   . Minutes of Exercise per Session:   Stress:   . Feeling of Stress :   Social Connections:   . Frequency of Communication with Friends and Family:   . Frequency of Social Gatherings with Friends and Family:   . Attends Religious Services:   . Active Member of Clubs or  Organizations:   . Attends Archivist Meetings:   [name] Marital Status:     Intimate Partner Violence:   . Fear of Current or Ex-Partner:   . Emotionally Abused:   [name] Physically Abused:   . Sexually Abused:     Family History: Family History  Problem Relation Age of Onset  . Hypertension Mother   . Stroke Mother   . Heart disease Mother   . Aneurysm Mother   . CVA Mother   . Hypertension Father   . Stroke Father   . Heart attack Father   . Alcohol abuse Father   . Hypertension Sister   . Hypertension Brother   . Colon cancer Brother   . Cancer Brother   . Diabetes Sister   . Diabetes Brother   . Heart attack Brother   . Heart attack Brother   . Heart disease Sister   . Alcohol abuse Brother   . Heart disease Daughter      Review of Systems: All other systems reviewed and are otherwise negative except as noted above.   Physical Exam: VS:  BP 98/62   Pulse 70   Ht 5\' 6"  (1.676 m)   Wt 135 lb 3.2 oz (61.3 kg)   SpO2 99%   BMI 21.82 kg/m  , BMI Body mass index is 21.82 kg/m.  GEN- The patient is elderly and frail appearing, alert and oriented x 3 today.   HEENT: normocephalic, atraumatic; sclera clear, conjunctiva pink; hearing intact; oropharynx clear; neck supple  Lungs- Clear to ausculation bilaterally, normal work of breathing.  No wheezes, rales, rhonchi Heart- Regular rate and rhythm (paced) GI- soft, non-tender, non-distended, bowel sounds present  Extremities- no clubbing, cyanosis, or edema  MS- no significant deformity or atrophy Skin- warm and dry, no rash or lesion; PPM pocket well healed Psych- euthymic mood, full affect Neuro- strength and sensation are intact  PPM Interrogation- reviewed in detail today,  See [facility] report  EKG:  EKG is not ordered today.  Recent Labs: 02/02/2020: B Natriuretic Peptide 623.9 04/22/2020: Hemoglobin 8.3; Platelets 492 04/23/2020: ALT 45; BUN 32; Creatinine, Ser 1.43; Potassium 3.9; Sodium 135   Wt Readings from Last 3 Encounters:  05/31/20 135 lb 3.2 oz (61.3 kg)  04/22/20 141 lb  1.5 oz (64 kg)  03/05/20 141 lb (64 kg)     Other studies Reviewed: Additional studies/ records that were reviewed today include: Dr Blenda Mounts notes  Assessment and Plan:  1.  Complete heart block Normal PPM function See [facility] report No changes today She is dependent today. Her device is not remote capable.   2.  Permanent atrial fibrillation Not a candidate for long term anticoagulation with prior GI bleeding.  Not a canddiate for Watchman with difficulty with screening TEE  3.  HTN Stable No change required today  4.  Chronic systolic heart failure Stable No change required today She has close follow up with Dr Oval Linsey and her team    Current medicines are reviewed at length with the patient today.   The patient does not have concerns regarding her medicines.  The following changes were made today:  none  Labs/ tests ordered today include: none Orders Placed This Encounter  Procedures  . CUP [facility]ACE[facility] INCLINIC DEVICE CHECK     Disposition:   Follow up with Dr Rayann Heman 6 months      Signed, Chanetta Marshall, NP 05/31/2020 9:13 AM  [place]  [place] [address] [phone] (office) [phone] (fax)

## 2020-05-31 NOTE — Patient Instructions (Signed)
Medication Instructions:  *If you need a refill on your cardiac medications before your next appointment, please call your pharmacy*  Lab Work: If you have labs (blood work) drawn today and your tests are completely normal, you will receive your results only by: [name] MyChart Message (if you have MyChart) OR . A paper copy in the mail If you have any lab test that is abnormal or we need to change your treatment, we will call you to review the results.  Testing/Procedures: None  Follow-Up: At [facility], you and your health needs are our priority.  As part of our continuing mission to provide you with exceptional heart care, we have created designated Provider Care Teams.  These Care Teams include your primary Cardiologist (physician) and Advanced Practice Providers (APPs -  Physician Assistants and Nurse Practitioners) who all work together to provide you with the care you need, when you need it.  We recommend signing up for the patient portal called "MyChart".  Sign up information is provided on this After Visit Summary.  MyChart is used to connect with patients for Virtual Visits (Telemedicine).  Patients are able to view lab/test results, encounter notes, upcoming appointments, etc.  Non-urgent messages can be sent to your provider as well.   To learn more about what you can do with MyChart, go to [email or web address].    Your next appointment:   Your physician wants you to follow-up in: 12 MONTHS with Dr. Rayann Heman. You will receive a reminder letter in the mail two months in advance. If you don't receive a letter, please call our office to schedule the follow-up appointment.   The format for your next appointment:   In Person  Provider:   Thompson Grayer, MD

## 2020-06-01 ENCOUNTER — Encounter (HOSPITAL_COMMUNITY)
Admission: RE | Admit: 2020-06-01 | Discharge: 2020-06-01 | Disposition: A | Discharge status: Home / Self Care | Payer: Medicare (Managed Care) | Source: Ambulatory Visit | Attending: Nurse Practitioner | Admitting: Nurse Practitioner

## 2020-06-01 DIAGNOSIS — D500 Iron deficiency anemia secondary to blood loss (chronic): Secondary | ICD-10-CM | POA: Diagnosis not present

## 2020-06-01 MED ORDER — SODIUM CHLORIDE 0.9% IV SOLUTION: Freq: Once | INTRAVENOUS | Status: DC

## 2020-06-02 LAB — TYPE AND SCREEN
ABO/RH(D): O POS
Antibody Screen: NEGATIVE
Unit division: 0

## 2020-06-02 LAB — BPAM RBC
Blood Product Expiration Date: 202107122359
ISSUE DATE / TIME: 202106180855
Unit Type and Rh: 5100

## 2020-06-05 ENCOUNTER — Encounter: Payer: Self-pay | Admitting: Hematology and Oncology

## 2020-06-05 ENCOUNTER — Other Ambulatory Visit: Payer: Self-pay

## 2020-06-05 ENCOUNTER — Telehealth: Payer: Self-pay

## 2020-06-05 ENCOUNTER — Inpatient Hospital Stay: Payer: Medicare (Managed Care) | Attending: Hematology and Oncology | Admitting: Hematology and Oncology

## 2020-06-05 DIAGNOSIS — Q2730 Arteriovenous malformation, site unspecified: Secondary | ICD-10-CM | POA: Diagnosis not present

## 2020-06-05 DIAGNOSIS — Z87891 Personal history of nicotine dependence: Secondary | ICD-10-CM | POA: Diagnosis not present

## 2020-06-05 DIAGNOSIS — E785 Hyperlipidemia, unspecified: Secondary | ICD-10-CM | POA: Insufficient documentation

## 2020-06-05 DIAGNOSIS — N183 Chronic kidney disease, stage 3 unspecified: Secondary | ICD-10-CM

## 2020-06-05 DIAGNOSIS — I5032 Chronic diastolic (congestive) heart failure: Secondary | ICD-10-CM | POA: Diagnosis not present

## 2020-06-05 DIAGNOSIS — Z79899 Other long term (current) drug therapy: Secondary | ICD-10-CM | POA: Diagnosis not present

## 2020-06-05 DIAGNOSIS — I252 Old myocardial infarction: Secondary | ICD-10-CM | POA: Insufficient documentation

## 2020-06-05 DIAGNOSIS — Z7901 Long term (current) use of anticoagulants: Secondary | ICD-10-CM | POA: Insufficient documentation

## 2020-06-05 DIAGNOSIS — I63412 Cerebral infarction due to embolism of left middle cerebral artery: Secondary | ICD-10-CM

## 2020-06-05 DIAGNOSIS — I442 Atrioventricular block, complete: Secondary | ICD-10-CM | POA: Insufficient documentation

## 2020-06-05 DIAGNOSIS — K5909 Other constipation: Secondary | ICD-10-CM | POA: Insufficient documentation

## 2020-06-05 DIAGNOSIS — I4821 Permanent atrial fibrillation: Secondary | ICD-10-CM | POA: Insufficient documentation

## 2020-06-05 DIAGNOSIS — D500 Iron deficiency anemia secondary to blood loss (chronic): Secondary | ICD-10-CM | POA: Diagnosis present

## 2020-06-05 DIAGNOSIS — Z8673 Personal history of transient ischemic attack (TIA), and cerebral infarction without residual deficits: Secondary | ICD-10-CM

## 2020-06-05 DIAGNOSIS — K219 Gastro-esophageal reflux disease without esophagitis: Secondary | ICD-10-CM | POA: Insufficient documentation

## 2020-06-05 DIAGNOSIS — I130 Hypertensive heart and chronic kidney disease with heart failure and stage 1 through stage 4 chronic kidney disease, or unspecified chronic kidney disease: Secondary | ICD-10-CM | POA: Diagnosis not present

## 2020-06-05 DIAGNOSIS — I422 Other hypertrophic cardiomyopathy: Secondary | ICD-10-CM | POA: Diagnosis not present

## 2020-06-05 DIAGNOSIS — F329 Major depressive disorder, single episode, unspecified: Secondary | ICD-10-CM | POA: Diagnosis not present

## 2020-06-05 NOTE — Progress Notes (Signed)
[facility] CONSULT NOTE  Patient Care Team: [facility], [place] as PCP - Bethena Roys, MD as PCP - Electrophysiology (Cardiology) Skeet Latch, MD as PCP - Cardiology (Cardiology) Mauri Pole, MD as Consulting Physician (Gastroenterology) Kennith Center, RD as Dietitian (Family Medicine)  CHIEF COMPLAINTS/PURPOSE OF CONSULTATION:  Chronic iron deficiency anemia, known arteriovenous malformation, patient on chronic anticoagulation therapy due to history of stroke  HISTORY OF PRESENTING ILLNESS:  [name] 74 y.o. female is here because of chronic anemia I spoke with the daughter and reviewed her records extensively Her daughter, [name] is present I also spoke briefly with nurse practitioner from [facility] The patient is known to have arteriovenous malformation She has chronic atrial fibrillation, complete heart block and congestive heart failure, with chronic kidney disease She right recurrent hospitalization She has history of stroke The patient is a poor historian but she is able to talk okay today  Over the past few years, she has received intermittent doses of intravenous iron, erythropoietin stimulating agents as well as blood transfusion Initially, her nephrologist is managing her treatment but subsequently, the patient was told she needs to see a hematologist Currently, she is on the [facility] program  She denies recent chest pain on exertion but has shortness of breath on minimal exertion Her daughter noted intermittent bright red blood per rectum after bowel movement I verified various AVS paperwork that her daughter brought in confirm the patient is taking Eliquis Her last EGD and colonoscopy was in 2018 She denies any pica and eats a variety of diet. She has chronic constipation  MEDICAL HISTORY:  Past Medical History:  Diagnosis Date  . Anemia   . Angioedema    2/2 ACE  . Arteriovenous  malformation of stomach   . Arthritis   . Asthma   . AVM (arteriovenous malformation) of colon    small intestine; stomach  . Blood transfusion   . CHF (congestive heart failure) ([place])   . Complete heart block ([place])    s/p PPM 1998  . Complication of anesthesia    Difficult airway; anaphylaxis and swelling with propofol  . DDD (degenerative disc disease)   . Depression   . Diastolic heart failure   . Fatty liver 07/26/10  . GERD (gastroesophageal reflux disease)   . GI bleed   . History of alcohol abuse Stopped Fall 2012  . History of tobacco use Quit Fall 2012  . Hx of cardiovascular stress test    a. Lexiscan Myoview (10/15):  Small inferolateral and apical defect c/w scar and poss soft tissue attenuation, no ischemia, EF 42%  . Hx of colonic polyp 08/13/10   adenomatous  . Hx of colonoscopy   . Hyperlipidemia   . Hypertension   . Hypertrophic cardiomyopathy ([place])    dx by Dr [name] 2009  . Iron deficiency anemia   . Myocardial infarction ([name])   . Panic attacks   . Permanent atrial fibrillation ([place])   . Renal failure    baseline creatinine 1.6  . Right arm pain 01/08/2012  . RLS (restless legs syndrome)    Dx 06/2007  . Sleep apnea     SURGICAL HISTORY: Past Surgical History:  Procedure Laterality Date  . CARDIAC CATHETERIZATION    . CARDIAC CATHETERIZATION N/A 08/19/2016   Procedure: Right/Left Heart Cath and Coronary Angiography;  Surgeon: Jolaine Artist, MD;  Location: [facility];  Service: Cardiovascular;  Laterality: N/A;  . COLONOSCOPY  WITH PROPOFOL N/A 06/16/2017   Procedure: COLONOSCOPY WITH PROPOFOL;  Surgeon: Mauri Pole, MD;  Location: [facility];  Service: Endoscopy;  Laterality: N/A;  . DIRECT LARYNGOSCOPY N/A 09/18/2016   Procedure: DIRECT LARYNGOSCOPY, BRONCHOSCOPY, REMOVAL OF INTUBATION GRANULOMA;  Surgeon: [name], MD;  Location[facility];  Service: ENT;  Laterality: N/A;  . DIRECT LARYNGOSCOPY N/A 10/20/2016   Procedure:  EXTUBATION AND FLEXIBLE LARYNGOSCOPE;  Surgeon: [name], MD;  Location: [facility];  Service: ENT;  Laterality: N/A;  . DIRECT LARYNGOSCOPY N/A 10/29/2016   Procedure: DIRECT LARYNGOSCOPY;  Surgeon: [name], MD;  [facility];  Service: ENT;  Laterality: N/A;  . ESOPHAGOGASTRODUODENOSCOPY  12/23/2011   Procedure: ESOPHAGOGASTRODUODENOSCOPY (EGD);  Surgeon: [name], MD;  Location: [facility];  Service: Endoscopy;  Laterality: N/A;  . ESOPHAGOGASTRODUODENOSCOPY (EGD) WITH PROPOFOL N/A 06/16/2017   Procedure: ESOPHAGOGASTRODUODENOSCOPY (EGD) WITH PROPOFOL;  Surgeon: Mauri Pole, MD;  Location: [facility];  Service: Endoscopy;  Laterality: N/A;  . EXTUBATION (ENDOTRACHEAL) IN OR N/A 07/21/2016   Procedure: EXTUBATION (ENDOTRACHEAL) IN OR;  Surgeon: [name], MD;  Location: [employer];  Service: ENT;  Laterality: N/A;  . GIVENS CAPSULE STUDY  12/23/2011   Procedure: GIVENS CAPSULE STUDY;  Surgeon: [name], MD;  Location: [facility];  Service: Endoscopy;  Laterality: N/A;  . MICROLARYNGOSCOPY WITH LASER N/A 10/16/2016   Procedure: MICRODIRECTLARYNGOSCOPY WITH LASER ABLATION AND KENLOG INJECTION;  Surgeon: [name], MD;  Location: [facility];  Service: ENT;  Laterality: N/A;  . PACEMAKER INSERTION  1998   st jude, most recent gen change by [name] 4/12  . TRACHEOSTOMY TUBE PLACEMENT N/A 10/29/2016   Procedure: TRACHEOSTOMY;  Surgeon: [name], MD;  Location: [facility];  Service: ENT;  Laterality: N/A;  . TUBAL LIGATION  04/01/2000    SOCIAL HISTORY: Social History   Socioeconomic History  . Marital status: Divorced    Spouse name: Not on file  . Number of children: 4  . Years of education: 9  . Highest education level: Not on file  Occupational History  . Occupation: disabled    Fish farm manager: UNEMPLOYED  . Occupation: previously- Development worker, community  Tobacco Use  . Smoking status: Former Smoker    Packs/day: 0.10    Years: 50.00    Pack years: 5.00    Types: Cigarettes     Quit date: 08/16/2011    Years since quitting: 8.8  . Smokeless tobacco: Never Used  . Tobacco comment: quit 2012  Vaping Use  . Vaping Use: Never used  Substance and Sexual Activity  . Alcohol use: No    Comment: quit 08/16/2011  . Drug use: No  . Sexual activity: Never  Other Topics Concern  . Not on file  Social History Narrative   On disability for heart failure/pacemaker.     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    4 grown children,  8 grandchildren.     Drinks alcohol a few times a week socially. At one time, the most she reports drinking is about 3 mixed drinks.   Lives with daughter [name] and son. Will be moving 04/2011 to different house because concerned children are taking advantage of her financial situation.    No illicit drugs.   Smokes few cigarettes daily, especially when stressed. Started smoking @ age 26. Quit January 2012 2/2 health but started smoking again 02/2011.       Health Care POA:    Emergency Contact: daughter, Verdene Lennert [phone]  End of Life Plan: gave pt AD info pamphlet   Who lives with you: no one- lives in section 8 housing   Any pets: none   Diet: Pt has a varied diet of protein, starch and vegetables   Exercise: Pt has no regular exercise routine.   Seatbelts: Pt reports wearing seatbelt when in vehicles.    Hobbies: dancing   Social Determinants of Health   Financial Resource Strain:   . Difficulty of Paying Living Expenses:   Food Insecurity:   . Worried About Charity fundraiser in the Last Year:   . Arboriculturist in the Last Year:   Transportation Needs:   . Film/video editor (Medical):   [name] Lack of Transportation (Non-Medical):   Physical Activity:   . Days of Exercise per Week:   . Minutes of Exercise per Session:   Stress:   . Feeling of Stress :   Social Connections:   . Frequency of Communication with Friends and Family:   . Frequency of Social Gatherings with Friends and Family:   . Attends  Religious Services:   . Active Member of Clubs or Organizations:   . Attends Archivist Meetings:   [name] Marital Status:   Intimate Partner Violence:   . Fear of Current or Ex-Partner:   . Emotionally Abused:   [name] Physically Abused:   . Sexually Abused:     FAMILY HISTORY: Family History  Problem Relation Age of Onset  . Hypertension Mother   . Stroke Mother   . Heart disease Mother   . Aneurysm Mother   . CVA Mother   . Hypertension Father   . Stroke Father   . Heart attack Father   . Alcohol abuse Father   . Hypertension Sister   . Hypertension Brother   . Colon cancer Brother   . Cancer Brother   . Diabetes Sister   . Diabetes Brother   . Heart attack Brother   . Heart attack Brother   . Heart disease Sister   . Alcohol abuse Brother   . Heart disease Daughter     ALLERGIES:  is allergic to ace inhibitors and other.  MEDICATIONS:  Current Outpatient Medications  Medication Sig Dispense Refill  . acetaminophen (TYLENOL) 650 MG CR tablet Take 650-1,300 mg by mouth every 8 (eight) hours as needed for pain.    [name] albuterol (PROVENTIL HFA;VENTOLIN HFA) 108 (90 Base) MCG/ACT inhaler Inhale 1-2 puffs into the lungs every 6 (six) hours as needed for wheezing or shortness of breath. 1 Inhaler 6  . allopurinol (ZYLOPRIM) 100 MG tablet TAKE 2 TABLETS(200 MG) BY MOUTH DAILY 180 tablet 3  . ALPRAZolam (XANAX) 0.5 MG tablet Take 1 tablet (0.5 mg total) by mouth at bedtime as needed for anxiety. 30 tablet 0  . apixaban (ELIQUIS) 5 MG TABS tablet Take 1 tablet (5 mg total) by mouth 2 (two) times daily. 180 tablet 1  . cetirizine (ZYRTEC) 10 MG tablet Take 10 mg by mouth daily as needed for allergies.     . Cholecalciferol (VITAMIN D) 50 MCG (2000 UT) tablet Take 2,000 Units by mouth daily.    [name] Epoetin Alfa-epbx (RETACRIT IJ) Inject as directed once a week.     . ferrous sulfate 325 (65 FE) MG tablet TAKE 1 TABLET BY MOUTH TWICE DAILY WITH MEALS TO KEEP BLOOD COUNT UP 60  tablet 11  . furosemide (LASIX) 20 MG tablet Take 1 tablet (20 mg total) by  mouth daily. Daily as needed for weight gain of 2lbs in a day or 5lbs in a week 30 tablet 6  . ipratropium-albuterol (DUONEB) 0.5-2.5 (3) MG/3ML SOLN USE 3 ML VIA NEBULIZER EVERY 6 HOURS 360 mL 0  . latanoprost (XALATAN) 0.005 % ophthalmic solution Place 1 drop into both eyes at bedtime.    [name] loratadine (CLARITIN) 10 MG tablet Take 1 tablet (10 mg total) by mouth daily. One po daily x 5 days 14 tablet 0  . metoprolol tartrate (LOPRESSOR) 25 MG tablet Take 1 tablet (25 mg total) by mouth 2 (two) times daily. 60 tablet 5  . nitroGLYCERIN (NITROSTAT) 0.4 MG SL tablet Place 1 tablet (0.4 mg total) under the tongue every 5 (five) minutes as needed for chest pain. 25 tablet 1  . Olopatadine HCl 0.2 % SOLN Place 1-2 drops into both eyes daily as needed (itching).   1  . oxymetazoline (AFRIN NASAL SPRAY) 0.05 % nasal spray Place 1 spray into both nostrils 2 (two) times daily. (Patient taking differently: Place 1 spray into both nostrils as needed. ) 30 mL 0  . potassium chloride (K-DUR) 10 MEQ tablet Take 1 tablet (10 mEq total) by mouth daily. 90 tablet 0  . rosuvastatin (CRESTOR) 20 MG tablet Take 1 tablet (20 mg total) by mouth at bedtime. 90 tablet 3  . sodium chloride (OCEAN) 0.65 % SOLN nasal spray Place 1 spray into both nostrils daily as needed for congestion.     [name] STIMULANT LAXATIVE 8.6-50 MG tablet Take 2 tablets by mouth at bedtime as needed for mild constipation.     . triamcinolone cream (KENALOG) 0.1 % Apply 1 application topically 2 (two) times daily. 30 g 2   No current facility-administered medications for this visit.    REVIEW OF SYSTEMS:   Constitutional: Denies fevers, chills or abnormal night sweats Eyes: Denies blurriness of vision, double vision or watery eyes Ears, nose, mouth, throat, and face: Denies mucositis or sore throat Cardiovascular: Denies palpitation, chest discomfort or lower extremity  swelling Skin: Denies abnormal skin rashes Lymphatics: Denies new lymphadenopathy or easy bruising Neurological:Denies numbness, tingling or new weaknesses Behavioral/Psych: Mood is stable, no new changes  All other systems were reviewed with the patient and are negative.  PHYSICAL EXAMINATION: ECOG PERFORMANCE STATUS: 2 - Symptomatic, <50% confined to bed  Vitals:   06/05/20 1343  BP: 120/74  Pulse: 71  Resp: 18  Temp: 98.9 F (37.2 C)  SpO2: 97%   Filed Weights   06/05/20 1343  Weight: 132 lb 9.6 oz (60.1 kg)    GENERAL:alert, no distress and comfortable.  Limited examination is performed as the patient is sitting on the wheelchair SKIN: skin color, texture, turgor are normal, no rashes or significant lesions EYES: normal, conjunctiva are pink and non-injected, sclera clear OROPHARYNX:no exudate, no erythema and lips, buccal mucosa, and tongue normal  NECK: supple, thyroid normal size, non-tender, without nodularity LYMPH:  no palpable lymphadenopathy in the cervical, axillary or inguinal LUNGS: clear to auscultation and percussion with normal breathing effort HEART: regular rate & rhythm and no murmurs and no lower extremity edema ABDOMEN:abdomen soft, non-tender and normal bowel sounds Musculoskeletal:no cyanosis of digits and no clubbing  PSYCH: alert & oriented x 3 with fluent speech NEURO: no focal motor/sensory deficits  Lab Results  Component Value Date   WBC 6.7 04/22/2020   HGB 8.3 (L) 04/22/2020   HCT 28.8 (L) 04/22/2020   MCV 87.0 04/22/2020   PLT 492 (H) 04/22/2020  RADIOGRAPHIC STUDIES: I have personally reviewed the radiological images as listed and agreed with the findings in the report. CUP PACEART INCLINIC DEVICE CHECK  Result Date: 05/31/2020 Pacemaker check in clinic. Normal device function. Thresholds, sensing, impedances consistent with previous measurements. Device programmed to maximize longevity. No high ventricular rates noted. Device  programmed at appropriate safety margins. Histogram  distribution appropriate for patient activity level. Device programmed to optimize intrinsic conduction. Estimated longevity 5 years.  Patient education completed.   ASSESSMENT & PLAN:  Anemia The cause of her anemia is multifactorial She has iron deficiency anemia secondary to chronic GI bleed from known arteriovenous malformation The daughter has observed passage of bright red blood per rectum intermittently She also has chronic renal failure requiring chronic treatment with erythropoietin stimulating agent She is placed on oral anticoagulation therapy because of chronic atrial fibrillation and history of stroke According to some documentation seen in her chart, the risk of bleeding appears to outweighs the risk of benefit However, the patient remains on chronic anticoagulation therapy with Eliquis I told the daughter that the patient and family has to make informed decision about what to do with her chronic anticoagulation therapy Her risk of hemorrhagic bleeding is high given history of AVM The patient will likely need chronic intravenous iron therapy along with erythropoietin stimulating agent with or without chronic anticoagulation therapy [facility] is currently managing her treatment I have not made a return appointment for the patient to come back; she does not need any further hematology work-up  AVM (arteriovenous malformation) She is known to have history of arteriovenous malformation Her last EGD and colonoscopy was in 2018 She has appointment to follow-up with GI service The AVM is unlikely going to resolve I would defer to GI service to discuss the risk and benefits of repeating endoscopy evaluation with family  CKD (chronic kidney disease) stage 3, GFR 30-59 ml/min (HCC) She will benefit from chronic erythropoietin stimulating agent long-term This is currently managed through [facility] program  Cerebral embolism with cerebral  infarction She is on anticoagulation therapy due to chronic atrial fibrillation and history of stroke I discussed with the patient and her daughter that they need to make a decision weighing in the risk and benefits of chronic anticoagulation therapy    All questions were answered. The patient knows to call the clinic with any problems, questions or concerns.  The total time spent in the appointment was 55 minutes encounter with patients including review of chart and various tests results, discussions about plan of care and coordination of care plan  Heath Lark, MD 6/22/20213:03 PM       Heath Lark, MD 06/05/20 3:03 PM

## 2020-06-05 NOTE — Assessment & Plan Note (Signed)
She is known to have history of arteriovenous malformation Her last EGD and colonoscopy was in 2018 She has appointment to follow-up with GI service The AVM is unlikely going to resolve I would defer to GI service to discuss the risk and benefits of repeating endoscopy evaluation with family

## 2020-06-05 NOTE — Telephone Encounter (Signed)
Mailed out a copy of office note to daughter, [name].

## 2020-06-05 NOTE — Progress Notes (Signed)
Cardiology Clinic Note   Patient Name: [name] Date of Encounter: 06/06/2020  Primary Care Provider:  [facility] Cardiologist:  Skeet Latch, MD  Patient Profile    [name] 74 year old female presents today for follow-up evaluation of her essential hypertension, acute on chronic CHF, complete heart block status post pacemaker (St Jude), hypertrophic cardiomyopathy, permanent atrial fibrillation, CKD, HLD, shortness of breath and COPD.  Past Medical History    Past Medical History:  Diagnosis Date  . Anemia   . Angioedema    2/2 ACE  . Arteriovenous malformation of stomach   . Arthritis   . Asthma   . AVM (arteriovenous malformation) of colon    small intestine; stomach  . Blood transfusion   . CHF (congestive heart failure) ([place])   . Complete heart block ([place])    s/p PPM 1998  . Complication of anesthesia    Difficult airway; anaphylaxis and swelling with propofol  . DDD (degenerative disc disease)   . Depression   . Diastolic heart failure   . Fatty liver 07/26/10  . GERD (gastroesophageal reflux disease)   . GI bleed   . History of alcohol abuse Stopped Fall 2012  . History of tobacco use Quit Fall 2012  . Hx of cardiovascular stress test    a. Lexiscan Myoview (10/15):  Small inferolateral and apical defect c/w scar and poss soft tissue attenuation, no ischemia, EF 42%  . Hx of colonic polyp 08/13/10   adenomatous  . Hx of colonoscopy   . Hyperlipidemia   . Hypertension   . Hypertrophic cardiomyopathy ([place])    dx by Dr [name] 2009  . Iron deficiency anemia   . Myocardial infarction ([place])   . Panic attacks   . Permanent atrial fibrillation ([place])   . Renal failure    baseline creatinine 1.6  . Right arm pain 01/08/2012  . RLS (restless legs syndrome)    Dx 06/2007  . Sleep apnea    Past Surgical History:  Procedure Laterality Date  . CARDIAC CATHETERIZATION    . CARDIAC  CATHETERIZATION N/A 08/19/2016   Procedure: Right/Left Heart Cath and Coronary Angiography;  Surgeon: Jolaine Artist, MD;  Location: [facility];  Service: Cardiovascular;  Laterality: N/A;  . COLONOSCOPY WITH PROPOFOL N/A 06/16/2017   Procedure: COLONOSCOPY WITH PROPOFOL;  Surgeon: Mauri Pole, MD;  Location: [facility];  Service: Endoscopy;  Laterality: N/A;  . DIRECT LARYNGOSCOPY N/A 09/18/2016   Procedure: DIRECT LARYNGOSCOPY, BRONCHOSCOPY, REMOVAL OF INTUBATION GRANULOMA;  Surgeon: [name], MD;  Location: [facility] OR;  Service: ENT;  Laterality: N/A;  . DIRECT LARYNGOSCOPY N/A 10/20/2016   Procedure: EXTUBATION AND FLEXIBLE LARYNGOSCOPE;  Surgeon: [name], MD;  Location: [place] [facility];  Service: ENT;  Laterality: N/A;  . DIRECT LARYNGOSCOPY N/A 10/29/2016   Procedure: DIRECT LARYNGOSCOPY;  Surgeon: [name], MD;  Location: [facility];  Service: ENT;  Laterality: N/A;  . ESOPHAGOGASTRODUODENOSCOPY  12/23/2011   Procedure: ESOPHAGOGASTRODUODENOSCOPY (EGD);  Surgeon: [name], MD;  Location: [facility];  Service: Endoscopy;  Laterality: N/A;  . ESOPHAGOGASTRODUODENOSCOPY (EGD) WITH PROPOFOL N/A 06/16/2017   Procedure: ESOPHAGOGASTRODUODENOSCOPY (EGD) WITH PROPOFOL;  Surgeon: Mauri Pole, MD;  Location: [facility];  Service: Endoscopy;  Laterality: N/A;  . EXTUBATION (ENDOTRACHEAL) IN OR N/A 07/21/2016   Procedure: EXTUBATION (ENDOTRACHEAL) IN OR;  Surgeon: [name], MD;  Location: [place];  Service: ENT;  Laterality: N/A;  . GIVENS CAPSULE STUDY  12/23/2011   Procedure: GIVENS CAPSULE STUDY;  Surgeon: [name], MD;  Location: [facility];  Service: Endoscopy;  Laterality: N/A;  . MICROLARYNGOSCOPY WITH LASER N/A 10/16/2016   Procedure: MICRODIRECTLARYNGOSCOPY WITH LASER ABLATION AND KENLOG INJECTION;  Surgeon: [name], MD;  Location: [place];  Service: ENT;  Laterality: N/A;  . PACEMAKER INSERTION  1998   st jude, most recent gen change by [name] 4/12  .  TRACHEOSTOMY TUBE PLACEMENT N/A 10/29/2016   Procedure: TRACHEOSTOMY;  Surgeon: [name], MD;  Location: [place];  Service: ENT;  Laterality: N/A;  . TUBAL LIGATION  04/01/2000    Allergies  Allergies  Allergen Reactions  . Ace Inhibitors Swelling    angiodema  . Other Anaphylaxis and Swelling    Reaction to unspecified anesthesia     History of Present Illness    [name] was initially seen and evaluated on December 2016 as a referral from EP.  She was referred to establish general cardiovascular care.  She was noted to be admitted to the hospital earlier in the month with complaints of chest pain and was mildly fluid volume overloaded.  Her BNP at that time was 436 and her echocardiogram showed normal systolic function with grade 2 diastolic dysfunction.  She was given IV diuresis and developed acute renal failure.  Her troponins were mildly elevated at 0.19.  A Lexiscan was negative for ischemia.  She continued to struggle with palpitations and dizziness.  She has been in a chronic A. fib and was not initially on anticoagulation due to history of GI bleed in 2012.  She also had known AVMs.  She was referred for a watchman device 7/17.  This was complicated by acute respiratory distress during the TEE which ultimately required intubation.  She developed acute biventricular failure that eventually normalized.  She was discharged to a SNF and was subsequently readmitted due to fluid volume overload.  She is struggled with laryngeal stenosis that is managed by an ENT physician in [place].  She is not considered a candidate for removal of her tracheostomy due to residual stenosis.  She was admitted to the hospital with CVA 5/20 and has residual expressive aphasia.  She was subsequently started on Eliquis despite her history of GI bleed.  The following month she noted blood in her stool however, her H&H remained stable.  She was referred to nephrology for her acute on chronic renal  failure.  She is noted to struggle with words at times which is frustrating to her.  She is also noted to have occasional left breast pain that is nonexertional.  She was noted to have shortness of breath with minimal exertion and had been exercising more regularly to improve her endurance.  She has attended [facility] twice weekly in the past.  She was seen by [name] on 12/14/2019.  During that time she was doing well.  Metoprolol had been added due to HCM, palpitations and dyspnea.  This had improved her symptoms and she was noticing fewer episodes of palpitations.  She indicated that she still had episodes of shortness of breath with activity.  She denied chest pain, orthopnea, PND and pressure.  She was noted to have mild lower extremity edema.  She indicated that she had presented to the emergency department on 12/29 due to bleeding around her trach and chest congestion.  She indicated that she had increased secretions but denied fever and chills.  Her chest x-ray at that time showed no acute significant  processes.  Her BNP was elevated at 347.  She presented the clinic 03/05/2020 for further evaluation and stated she was concerned about her medications.  She presented with her daughter and daughter stated that she was  getting all of her medications at [facility] and a controlled environment.  Her medications come in a blister pack which are then distributed in her medication cups and administered 2 times per day.  She had been intermittently participating in some of the group activities and had lost some weight.  Her daughter was concerned about her weight loss however her BMI was 22.7.  I have encouraged her to increase her caloric intake and maintain her p.o. fluid consumption.  Overall she was doing well from a cardiac standpoint and had no complaints of palpitations.  She had noticed brief periods of chest discomfort that lasted for seconds and then dissipated.  I planned follow-up for 3 months.  She was  seen by Chanetta Marshall NP for EP evaluation.  She was noted to have a normal functioning PPM.  No changes were made at that time.  She is not a candidate for anticoagulation due to history of GI bleeding, also not a candidate for watchman.  She was being evaluated for anemia which was contributing to fatigue and some confusion.  She presents to the clinic today for follow-up evaluation and states she feels well.  She had a visit with oncology/hematology yesterday who recommended a review of her Eliquis dosing.  She currently has a 7.2% risk of CVA.  This information was shared with patient's daughter and patient.  With shared decision making family agreed that she should remain on Eliquis.  She has occasional bright red blood in stool from her AVMs.  She needs to have periodic iron infusions and erythropoietin injections as well.  She continues to receive her medication and continued care from [facility].  I will have her continue her current dose of Eliquis, and have her follow-up with [name] in 3 months.  Today she denies chest pain, shortness of breath, lower extremity edema, fatigue, palpitations, melena, hematuria, hemoptysis, diaphoresis, weakness, presyncope, syncope, orthopnea, and PND.  Home Medications    Prior to Admission medications   Medication Sig Start Date End Date Taking? Authorizing Provider  acetaminophen (TYLENOL) 650 MG CR tablet Take 650-1,300 mg by mouth every 8 (eight) hours as needed for pain.    [provider]  albuterol (PROVENTIL HFA;VENTOLIN HFA) 108 (90 Base) MCG/ACT inhaler Inhale 1-2 puffs into the lungs every 6 (six) hours as needed for wheezing or shortness of breath. 08/04/18   Riccio, Gardiner Rhyme, DO  allopurinol (ZYLOPRIM) 100 MG tablet TAKE 2 TABLETS(200 MG) BY MOUTH DAILY 05/18/19   [name], Gardiner Rhyme, DO  ALPRAZolam Duanne Moron) 0.5 MG tablet Take 1 tablet (0.5 mg total) by mouth at bedtime as needed for anxiety. 08/15/19   [name], [name], DO  apixaban (ELIQUIS) 5  MG TABS tablet Take 1 tablet (5 mg total) by mouth 2 (two) times daily. 06/06/19   Skeet Latch, MD  cetirizine (ZYRTEC) 10 MG tablet Take 10 mg by mouth daily as needed for allergies.     [provider]  Cholecalciferol (VITAMIN D) 50 MCG (2000 UT) tablet Take 2,000 Units by mouth daily.    [provider]  Epoetin Alfa-epbx (RETACRIT IJ) Inject as directed once a week.     [provider]  ferrous sulfate 325 (65 FE) MG tablet TAKE 1 TABLET BY MOUTH TWICE DAILY WITH MEALS  TO KEEP BLOOD COUNT UP 08/26/16   Lucila Maine C, DO  furosemide (LASIX) 20 MG tablet Take 1 tablet (20 mg total) by mouth daily. Daily as needed for weight gain of 2lbs in a day or 5lbs in a week 12/14/19   Skeet Latch, MD  guaiFENesin (MUCINEX) 600 MG 12 hr tablet Take 1 tablet (600 mg total) by mouth 2 (two) times daily as needed for to loosen phlegm. 12/13/19   Charlesetta Shanks, MD  ipratropium-albuterol (DUONEB) 0.5-2.5 (3) MG/3ML SOLN USE 3 ML VIA NEBULIZER EVERY 6 HOURS 10/21/18   Riccio, Gardiner Rhyme, DO  latanoprost (XALATAN) 0.005 % ophthalmic solution Place 1 drop into both eyes at bedtime. 11/24/19   [provider]  loratadine (CLARITIN) 10 MG tablet Take 1 tablet (10 mg total) by mouth daily. One po daily x 5 days 12/13/19   Charlesetta Shanks, MD  metoprolol tartrate (LOPRESSOR) 25 MG tablet Take 1 tablet (25 mg total) by mouth 2 (two) times daily. 09/30/19   Skeet Latch, MD  nitroGLYCERIN (NITROSTAT) 0.4 MG SL tablet Place 1 tablet (0.4 mg total) under the tongue every 5 (five) minutes as needed for chest pain. 08/04/18   Steve Rattler, DO  Olopatadine HCl 0.2 % SOLN Place 1-2 drops into both eyes daily as needed (itching).  02/17/18   [provider]  oxymetazoline (AFRIN NASAL SPRAY) 0.05 % nasal spray Place 1 spray into both nostrils 2 (two) times daily. Patient taking differently: Place 1 spray into both nostrils as needed.  05/21/19   Hall-Potvin, [place],  PA-C  potassium chloride (K-DUR) 10 MEQ tablet Take 1 tablet (10 mEq total) by mouth daily. 08/19/19   [name], [name], DO  rosuvastatin (CRESTOR) 20 MG tablet Take 1 tablet (20 mg total) by mouth at bedtime. 08/19/19   [name], [name], DO  sodium chloride (OCEAN) 0.65 % SOLN nasal spray Place 1 spray into both nostrils daily as needed for congestion.     [provider]  STIMULANT LAXATIVE 8.6-50 MG tablet Take 2 tablets by mouth at bedtime as needed for mild constipation.  11/22/19   [provider]  triamcinolone cream (KENALOG) 0.1 % Apply 1 application topically 2 (two) times daily. 06/03/18   Zenia Resides, MD    Family History    Family History  Problem Relation Age of Onset  . Hypertension Mother   . Stroke Mother   . Heart disease Mother   . Aneurysm Mother   . CVA Mother   . Hypertension Father   . Stroke Father   . Heart attack Father   . Alcohol abuse Father   . Hypertension Sister   . Hypertension Brother   . Colon cancer Brother   . Cancer Brother   . Diabetes Sister   . Diabetes Brother   . Heart attack Brother   . Heart attack Brother   . Heart disease Sister   . Alcohol abuse Brother   . Heart disease Daughter    She indicated that her mother is deceased. She indicated that her father is deceased. She indicated that three of her six sisters are alive. She indicated that two of her five brothers are alive. She indicated that her maternal grandmother is deceased. She indicated that her maternal grandfather is deceased. She indicated that her paternal grandmother is deceased. She indicated that her paternal grandfather is deceased. She indicated that all of her three daughters are alive. She indicated that her son is alive.  Social History  Social History   Socioeconomic History  . Marital status: Divorced    Spouse name: Not on file  . Number of children: 4  . Years of education: 9  . Highest education level: Not on file  Occupational  History  . Occupation: disabled    Fish farm manager: UNEMPLOYED  . Occupation: previously- Development worker, community  Tobacco Use  . Smoking status: Former Smoker    Packs/day: 0.10    Years: 50.00    Pack years: 5.00    Types: Cigarettes    Quit date: 08/16/2011    Years since quitting: 8.8  . Smokeless tobacco: Never Used  . Tobacco comment: quit 2012  Vaping Use  . Vaping Use: Never used  Substance and Sexual Activity  . Alcohol use: No    Comment: quit 08/16/2011  . Drug use: No  . Sexual activity: Never  Other Topics Concern  . Not on file  Social History Narrative   On disability for heart failure/pacemaker.     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    4 grown children,  8 grandchildren.     Drinks alcohol a few times a week socially. At one time, the most she reports drinking is about 3 mixed drinks.   Lives with daughter [name] and son. Will be moving 04/2011 to different house because concerned children are taking advantage of her financial situation.    No illicit drugs.   Smokes few cigarettes daily, especially when stressed. Started smoking @ age 64. Quit January 2012 2/2 health but started smoking again 02/2011.       Health Care POA:    Emergency Contact: daughter, Verdene Lennert [phone]   End of Life Plan: gave pt AD info pamphlet   Who lives with you: no one- lives in section 8 housing   Any pets: none   Diet: Pt has a varied diet of protein, starch and vegetables   Exercise: Pt has no regular exercise routine.   Seatbelts: Pt reports wearing seatbelt when in vehicles.    Hobbies: dancing   Social Determinants of Health   Financial Resource Strain:   . Difficulty of Paying Living Expenses:   Food Insecurity:   . Worried About Charity fundraiser in the Last Year:   . Arboriculturist in the Last Year:   Transportation Needs:   . Film/video editor (Medical):   [name] Lack of Transportation (Non-Medical):   Physical Activity:   . Days of Exercise per Week:     . Minutes of Exercise per Session:   Stress:   . Feeling of Stress :   Social Connections:   . Frequency of Communication with Friends and Family:   . Frequency of Social Gatherings with Friends and Family:   . Attends Religious Services:   . Active Member of Clubs or Organizations:   . Attends Archivist Meetings:   [name] Marital Status:   Intimate Partner Violence:   . Fear of Current or Ex-Partner:   . Emotionally Abused:   [name] Physically Abused:   . Sexually Abused:      Review of Systems    General:  No chills, fever, night sweats or weight changes.  Cardiovascular:  No chest pain, dyspnea on exertion, edema, orthopnea, palpitations, paroxysmal nocturnal dyspnea. Dermatological: No rash, lesions/masses Respiratory: No cough, dyspnea Urologic: No hematuria, dysuria Abdominal:   No nausea, vomiting, diarrhea, bright red blood per rectum, melena, or hematemesis Neurologic:  No visual changes, wkns, changes  in mental status. All other systems reviewed and are otherwise negative except as noted above.  Physical Exam    VS:  BP 122/80   Pulse 72   Temp (!) 97 F (36.1 C)   Ht 5\' 6"  (1.676 m)   Wt 130 lb 12.8 oz (59.3 kg)   SpO2 99%   BMI 21.11 kg/m  , BMI Body mass index is 21.11 kg/m. GEN: Well nourished, well developed, in no acute distress. HEENT: normal. Neck: Supple, no JVD, carotid bruits, or masses. Cardiac: RRR, no murmurs, rubs, or gallops. No clubbing, cyanosis, edema.  Radials/DP/PT 2+ and equal bilaterally.  Respiratory:  Respirations regular and unlabored, clear to auscultation bilaterally. GI: Soft, nontender, nondistended, BS + x 4. MS: no deformity or atrophy. Skin: warm and dry, no rash. Neuro:  Strength and sensation are intact. Psych: Normal affect.  Accessory Clinical Findings    ECG personally reviewed by me today-none today.  Echocardiogram 05/09/2019  IMPRESSIONS   1. The left ventricle has hyperdynamic systolic function, with  an ejection fraction of >65%. The cavity size was normal. There is severely increased left ventricular wall thickness. Findings are consistent with hypertrophic cardiomyopathy. Left  ventricular diastolic Doppler parameters are consistent with pseudonormalization. 2. The right ventricle has normal systolic function. The cavity was normal. There is no increase in right ventricular wall thickness. 3. Left atrial size was moderately dilated. 4. Right atrial size was moderately dilated. 5. Mitral valve regurgitation is moderate by color flow Doppler. 6. The aortic valve is tricuspid. Mild thickening of the aortic valve. Mild calcification of the aortic valve.   ICD Interrogation 05/31/2020:  St. Just single chamber pacemaker Battery 2.76 V Battery longevity 5-5.25 years Impedance: 1700 Ohms  Assessment & Plan   1.  HCM/hypertension -BP today 122/80. metoprolol previously added due to dyspnea and palpitations which showed some improvement in symptoms. Continue metoprolol 25 mg twice daily Heart healthy low-sodium diet Increase physical activity as tolerated  Hyperlipidemia-LDL 56 05/09/2019 Continue rosuvastatin 20 mg daily Heart healthy low-sodium high-fiber diet Increase physical activity as tolerated-continue to participate in group exercise  Chronic diastolic CHF-continues to be euvolemic today, no increased work of breathing.  Echocardiogram 5/20 showed EF greater than 65%. Continue furosemide 20 mg daily Continue metoprolol 25 mg twice daily   Chronic atrial fibrillation/complete heart block -heart rate today 72 PPM placed 1998 (Saint Jude) CHA2DS2-VASc 2 score 6  Continue Eliquis Followed by EP  Disposition: Follow-up with [name] in 3 months.  Jossie Ng. Meosha Castanon NP-C    06/06/2020, 2:52 PM [facility] [address] Office [phone] Fax [phone]

## 2020-06-05 NOTE — Assessment & Plan Note (Signed)
She is on anticoagulation therapy due to chronic atrial fibrillation and history of stroke I discussed with the patient and her daughter that they need to make a decision weighing in the risk and benefits of chronic anticoagulation therapy

## 2020-06-05 NOTE — Assessment & Plan Note (Signed)
She will benefit from chronic erythropoietin stimulating agent long-term This is currently managed through [facility] program

## 2020-06-05 NOTE — Assessment & Plan Note (Addendum)
The cause of her anemia is multifactorial She has iron deficiency anemia secondary to chronic GI bleed from known arteriovenous malformation The daughter has observed passage of bright red blood per rectum intermittently She also has chronic renal failure requiring chronic treatment with erythropoietin stimulating agent She is placed on oral anticoagulation therapy because of chronic atrial fibrillation and history of stroke According to some documentation seen in her chart, the risk of bleeding appears to outweighs the risk of benefit However, the patient remains on chronic anticoagulation therapy with Eliquis I told the daughter that the patient and family has to make informed decision about what to do with her chronic anticoagulation therapy Her risk of hemorrhagic bleeding is high given history of AVM The patient will likely need chronic intravenous iron therapy along with erythropoietin stimulating agent with or without chronic anticoagulation therapy [facility] is currently managing her treatment I have not made a return appointment for the patient to come back; she does not need any further hematology work-up

## 2020-06-05 NOTE — Telephone Encounter (Signed)
-----   Message from Heath Lark, MD sent at 06/05/2020  3:18 PM EDT ----- Regarding: pls mail a copy of the notes to her address, addressed to daughter [name]

## 2020-06-06 ENCOUNTER — Ambulatory Visit (INDEPENDENT_AMBULATORY_CARE_PROVIDER_SITE_OTHER): Payer: Medicare (Managed Care) | Admitting: General Practice

## 2020-06-06 ENCOUNTER — Encounter: Payer: Self-pay | Admitting: General Practice

## 2020-06-06 VITALS — BP 122/80 | HR 72 | Temp 97.0°F | Ht 66.0 in | Wt 130.8 lb

## 2020-06-06 DIAGNOSIS — I5032 Chronic diastolic (congestive) heart failure: Secondary | ICD-10-CM | POA: Diagnosis not present

## 2020-06-06 DIAGNOSIS — I4821 Permanent atrial fibrillation: Secondary | ICD-10-CM | POA: Diagnosis not present

## 2020-06-06 DIAGNOSIS — I422 Other hypertrophic cardiomyopathy: Secondary | ICD-10-CM | POA: Diagnosis not present

## 2020-06-06 DIAGNOSIS — I10 Essential (primary) hypertension: Secondary | ICD-10-CM | POA: Diagnosis not present

## 2020-06-06 DIAGNOSIS — I442 Atrioventricular block, complete: Secondary | ICD-10-CM

## 2020-06-06 NOTE — Patient Instructions (Signed)
Medication Instructions:  The current medical regimen is effective;  continue present plan and medications as directed. Please refer to the Current Medication list given to you today. *If you need a refill on your cardiac medications before your next appointment, please call your pharmacy*  Special Instructions PLEASE READ AND FOLLOW SALTY 6-ATTACHED  Follow-Up: Your next appointment:  3 month(s) In Person with Skeet Latch, MD  At [facility], you and your health needs are our priority.  As part of our continuing mission to provide you with exceptional heart care, we have created designated Provider Care Teams.  These Care Teams include your primary Cardiologist (physician) and Advanced Practice Providers (APPs -  Physician Assistants and Nurse Practitioners) who all work together to provide you with the care you need, when you need it.  We recommend signing up for the patient portal called "MyChart".  Sign up information is provided on this After Visit Summary.  MyChart is used to connect with patients for Virtual Visits (Telemedicine).  Patients are able to view lab/test results, encounter notes, upcoming appointments, etc.  Non-urgent messages can be sent to your provider as well.   To learn more about what you can do with MyChart, go to [email or web address].

## 2020-06-14 ENCOUNTER — Encounter: Payer: Self-pay | Admitting: Physician Assistant

## 2020-06-14 ENCOUNTER — Other Ambulatory Visit (INDEPENDENT_AMBULATORY_CARE_PROVIDER_SITE_OTHER): Payer: Medicare (Managed Care)

## 2020-06-14 ENCOUNTER — Ambulatory Visit (INDEPENDENT_AMBULATORY_CARE_PROVIDER_SITE_OTHER): Payer: Medicare (Managed Care) | Admitting: Physician Assistant

## 2020-06-14 VITALS — BP 118/70 | HR 72 | Ht 62.75 in | Wt 130.4 lb

## 2020-06-14 DIAGNOSIS — Z1211 Encounter for screening for malignant neoplasm of colon: Secondary | ICD-10-CM | POA: Diagnosis not present

## 2020-06-14 DIAGNOSIS — Z8774 Personal history of (corrected) congenital malformations of heart and circulatory system: Secondary | ICD-10-CM | POA: Diagnosis not present

## 2020-06-14 DIAGNOSIS — Z86010 Personal history of colonic polyps: Secondary | ICD-10-CM

## 2020-06-14 DIAGNOSIS — D509 Iron deficiency anemia, unspecified: Secondary | ICD-10-CM

## 2020-06-14 DIAGNOSIS — Z860101 Personal history of adenomatous and serrated colon polyps: Secondary | ICD-10-CM

## 2020-06-14 LAB — CBC WITH DIFFERENTIAL/PLATELET
Basophils Absolute: 0 10*3/uL (ref 0.0–0.1)
Basophils Relative: 0.3 % (ref 0.0–3.0)
Eosinophils Absolute: 0 10*3/uL (ref 0.0–0.7)
Eosinophils Relative: 0.5 % (ref 0.0–5.0)
HCT: 30.4 % — ABNORMAL LOW (ref 36.0–46.0)
Hemoglobin: 9.6 g/dL — ABNORMAL LOW (ref 12.0–15.0)
Lymphocytes Relative: 15.3 % (ref 12.0–46.0)
Lymphs Abs: 1.3 10*3/uL (ref 0.7–4.0)
MCHC: 31.5 g/dL (ref 30.0–36.0)
MCV: 83.6 fl (ref 78.0–100.0)
Monocytes Absolute: 0.9 10*3/uL (ref 0.1–1.0)
Monocytes Relative: 10.6 % (ref 3.0–12.0)
Neutro Abs: 6.3 10*3/uL (ref 1.4–7.7)
Neutrophils Relative %: 73.3 % (ref 43.0–77.0)
Platelets: 410 10*3/uL — ABNORMAL HIGH (ref 150.0–400.0)
RBC: 3.63 Mil/uL — ABNORMAL LOW (ref 3.87–5.11)
RDW: 21 % — ABNORMAL HIGH (ref 11.5–15.5)
WBC: 8.6 10*3/uL (ref 4.0–10.5)

## 2020-06-14 NOTE — Patient Instructions (Addendum)
Your provider has requested that you go to the basement level for lab work before leaving today. Press "B" on the elevator. The lab is located at the first door on the left as you exit the elevator.  It has been recommended to you by your physician that you have a(n) Colon/Endo at hospital with Dr.Nandigam completed.We did not schedule the procedure(s) today because we need to get clearance from your cardiologist to hold Eliquis x2 days. We also need medical clearance due to recent CVA in March of 2021. Our office will contact you once we have clearance.   If you are age 64 or older, your body mass index should be between 23-30. Your Body mass index is 23.28 kg/m. If this is out of the aforementioned range listed, please consider follow up with your Primary Care Provider.  If you are age 90 or younger, your body mass index should be between 19-25. Your Body mass index is 23.28 kg/m. If this is out of the aformentioned range listed, please consider follow up with your Primary Care Provider.    Due to recent changes in healthcare laws, you may see the results of your imaging and laboratory studies on MyChart before your provider has had a chance to review them.  We understand that in some cases there may be results that are confusing or concerning to you. Not all laboratory results come back in the same time frame and the provider may be waiting for multiple results in order to interpret others.  Please give [place] 48 hours in order for your provider to thoroughly review all the results before contacting the office for clarification of your results.   It was a pleasure to see you today!  Amy Esterwood-PA

## 2020-06-15 ENCOUNTER — Telehealth: Payer: Self-pay | Admitting: *Deleted

## 2020-06-15 ENCOUNTER — Encounter: Payer: Medicare (Managed Care) | Admitting: Student

## 2020-06-15 NOTE — Telephone Encounter (Signed)
Patient with diagnosis of afib on Eliquis for anticoagulation.    Procedure: Colon/Endo Date of procedure: TBD  CHADS2-VASc score of  8 (CHF, HTN, AGE, stroke/tia x 2, CAD, AGE, female)  Patient had a recent CVA in march 2021  CrCl 27 ml/min  Per office protocol, patient can hold Eliquis for 1.5 days prior to procedure.  (due to renal function) If longer hold is desired, will need MD input

## 2020-06-15 NOTE — Telephone Encounter (Signed)
[facility] Pre-operative Risk Assessment     Request for surgical clearance:     Endoscopy Procedure  What type of surgery is being performed?     Colon/Endo at [facility]  When is this surgery scheduled?     Not currently scheduled   What type of clearance is required ?   Pharmacy  Are there any medications that need to be held prior to surgery and how long? Eliquis   Practice name and name of physician performing surgery?      [facility]  What is your office phone and fax number?      Phone- [phone]  [phone]  Anesthesia type (None, local, MAC, general) ?       MAC  We also need to be sure that she is ok to have colon/endo sometime in September Hx of CVA and started Eliquis in 02/2020

## 2020-06-15 NOTE — Telephone Encounter (Signed)
   Primary Cardiologist: Skeet Latch, MD  Chart reviewed as part of pre-operative protocol coverage. Given past medical history and time since last visit, based on ACC/AHA guidelines, Magdeline Delcastillo-Polk would be at acceptable risk for the planned procedure without further cardiovascular testing.   Patient with diagnosis of afib on Eliquis for anticoagulation.    Procedure: Colon/Endo Date of procedure: TBD  CHADS2-VASc score of  8 (CHF, HTN, AGE, stroke/tia x 2, CAD, AGE, female)  Patient had a recent CVA in march 2021  CrCl 27 ml/min  Per office protocol, patient can hold Eliquis for 1.5 days prior to procedure.  (due to renal function)  I will route this recommendation to the requesting party via Epic fax function and remove from pre-op pool.  Please call with questions.  Jossie Ng. Norelle Runnion NP-C    06/15/2020, 3:31 PM [facility] [place] [address] Office [phone] Fax [phone]

## 2020-06-19 ENCOUNTER — Encounter: Payer: Self-pay | Admitting: Physician Assistant

## 2020-06-19 NOTE — Telephone Encounter (Signed)
[name], FYI  Looks like at this time patient will still be at risk for procedure...Marland KitchenMarland KitchenMarland Kitchen I said it would not be until September  Please advise

## 2020-06-19 NOTE — Progress Notes (Signed)
Subjective:    Patient ID: [name], female    DOB: [birth year], 74 y.o.   MRN: [id number]  HPI [name] is a pleasant 74 year old African-American female, established with Dr. Silverio Decamp, who is referred back today by [facility] for further evaluation of chronic iron deficiency anemia felt to be multifactorial and for consideration of endoscopic evaluation.  Patient has been on IV iron replacement and EPO over the past couple of years.  She has history of chronic GI blood loss secondary to previously documented AVMs, and requires chronic anticoagulation-Eliquis with history of atrial fibrillation. She required transfusion on 06/04/2020.  I do not have a follow-up hemoglobin posttransfusion. Patient also with chronic kidney disease stage III, polymyalgia rheumatica, history of tracheal and laryngeal stenosis status post trach, COPD, prior history of CVA, congestive heart failure with last EF of 65% but severe left ventricular wall thickening and hypertension. She has not been aware of any recent melena , may have had a small amount of blood on the tissue last week..  She denies any problems with abdominal pain, no heartburn or indigestion or dysphagia.  She says she does not feel any better post transfusion. Last EGD and colonoscopy were done in July 2018.  EGD was unremarkable and colonoscopy with finding of 7 polyps, the largest was 15 mm all removed.  Path consistent with tubular adenomas and follow-up in 3 years was recommended. She had undergone capsule endoscopy in 2013 , negative but with fair to poor prep. Most recent hemoglobin in our system 8.3 with hematocrit of 28.8 on 04/22/2020.    Review of Systems Pertinent positive and negative review of systems were noted in the above HPI section.  All other review of systems was otherwise negative.  Outpatient Encounter Medications as of 06/14/2020  Medication Sig  . acetaminophen (TYLENOL) 650 MG CR tablet Take 650-1,300 mg by mouth every 8  (eight) hours as needed for pain.  [name] albuterol (PROVENTIL HFA;VENTOLIN HFA) 108 (90 Base) MCG/ACT inhaler Inhale 1-2 puffs into the lungs every 6 (six) hours as needed for wheezing or shortness of breath.  . allopurinol (ZYLOPRIM) 100 MG tablet TAKE 2 TABLETS(200 MG) BY MOUTH DAILY  . ALPRAZolam (XANAX) 0.5 MG tablet Take 1 tablet (0.5 mg total) by mouth at bedtime as needed for anxiety.  [name] apixaban (ELIQUIS) 5 MG TABS tablet Take 1 tablet (5 mg total) by mouth 2 (two) times daily.  . cetirizine (ZYRTEC) 10 MG tablet Take 10 mg by mouth daily as needed for allergies.   . Cholecalciferol (VITAMIN D) 50 MCG (2000 UT) tablet Take 2,000 Units by mouth daily.  [name] Epoetin Alfa-epbx (RETACRIT IJ) Inject as directed once a week.   . ferrous sulfate 325 (65 FE) MG tablet TAKE 1 TABLET BY MOUTH TWICE DAILY WITH MEALS TO KEEP BLOOD COUNT UP  . furosemide (LASIX) 20 MG tablet Take 1 tablet (20 mg total) by mouth daily. Daily as needed for weight gain of 2lbs in a day or 5lbs in a week  . ipratropium-albuterol (DUONEB) 0.5-2.5 (3) MG/3ML SOLN USE 3 ML VIA NEBULIZER EVERY 6 HOURS  . latanoprost (XALATAN) 0.005 % ophthalmic solution Place 1 drop into both eyes at bedtime.  [name] loratadine (CLARITIN) 10 MG tablet Take 1 tablet (10 mg total) by mouth daily. One po daily x 5 days  . metoprolol tartrate (LOPRESSOR) 25 MG tablet Take 1 tablet (25 mg total) by mouth 2 (two) times daily.  . nitroGLYCERIN (NITROSTAT) 0.4 MG SL tablet Place 1 tablet (  0.4 mg total) under the tongue every 5 (five) minutes as needed for chest pain.  [name] Olopatadine HCl 0.2 % SOLN Place 1-2 drops into both eyes daily as needed (itching).   [name] oxymetazoline (AFRIN NASAL SPRAY) 0.05 % nasal spray Place 1 spray into both nostrils 2 (two) times daily. (Patient taking differently: Place 1 spray into both nostrils as needed. )  . potassium chloride (K-DUR) 10 MEQ tablet Take 1 tablet (10 mEq total) by mouth daily.  . rosuvastatin (CRESTOR) 20 MG tablet  Take 1 tablet (20 mg total) by mouth at bedtime.  . sodium chloride (OCEAN) 0.65 % SOLN nasal spray Place 1 spray into both nostrils daily as needed for congestion.   [name] STIMULANT LAXATIVE 8.6-50 MG tablet Take 2 tablets by mouth at bedtime as needed for mild constipation.   . triamcinolone cream (KENALOG) 0.1 % Apply 1 application topically 2 (two) times daily.   No facility-administered encounter medications on file as of 06/14/2020.   Allergies  Allergen Reactions  . Ace Inhibitors Swelling    angiodema  . Other Anaphylaxis and Swelling    Reaction to unspecified anesthesia    Patient Active Problem List   Diagnosis Date Noted  . AKI (acute kidney injury) ([place]) 04/23/2020  . Cough with hemoptysis 08/02/2019  . SOB (shortness of breath)   . Atypical chest pain   . Chest pain on exertion 05/15/2019  . Cerebral embolism with cerebral infarction 05/09/2019  . Expressive aphasia 05/08/2019  . Other abnormal glucose 11/26/2018  . Polypharmacy 06/25/2018  . Depression with anxiety   . Tracheostomy in place [facility])   . Tracheostomy care ([place])   . Calculus of gallbladder without cholecystitis without obstruction 12/24/2017  . Chronic cough 09/25/2017  . History of colonic polyps   . Moderate protein-calorie malnutrition ([place]) 06/02/2017  . Chronic idiopathic constipation 03/19/2017  . 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 Permanent atrial fibrillation ([place])   . Laryngeal stenosis 09/18/2016  . Esophageal dysphagia   . Polymyalgia rheumatica ([place]) 04/10/2015  . Hypertrophic cardiomyopathy ([place]) 02/15/2015  . Eczema 09/18/2014  . Environmental allergies 09/18/2014  . Pre-diabetes 11/26/2013  . COPD, moderate ([place]) 11/01/2013  . Complete heart block ([place]) 04/03/2011  . Anemia 02/17/2011  . AVM (arteriovenous malformation) 02/13/2011  . PACEMAKER-[place] 11/28/2010  . CKD (chronic kidney disease) stage 3, GFR 30-59 ml/min ([facility]) 09/24/2010  . Acute on chronic congestive heart failure ([place])  08/30/2010  . ARTHRITIS 07/24/2010  . Generalized anxiety disorder 07/23/2010  . Hyperlipidemia 02/11/2007  . Essential hypertension 02/11/2007  . APNEA, SLEEP 02/11/2007   Social History   Socioeconomic History  . Marital status: Divorced    Spouse name: Not on file  . Number of children: 4  . Years of education: 9  . Highest education level: Not on file  Occupational History  . Occupation: disabled    Fish farm manager: UNEMPLOYED  . Occupation: previously- Development worker, community  Tobacco Use  . Smoking status: Former Smoker    Packs/day: 0.10    Years: 50.00    Pack years: 5.00    Types: Cigarettes    Quit date: 08/16/2011    Years since quitting: 8.8  . Smokeless tobacco: Never Used  . Tobacco comment: quit 2012  Vaping Use  . Vaping Use: Never used  Substance and Sexual Activity  . Alcohol use: No    Comment: quit 08/16/2011  . Drug use: No  . Sexual activity: Never  Other Topics Concern  . Not on file  Social History Narrative   On disability for heart failure/pacemaker.     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    4 grown children,  8 grandchildren.     Drinks alcohol a few times a week socially. At one time, the most she reports drinking is about 3 mixed drinks.   Lives with daughter [name] and son. Will be moving 04/2011 to different house because concerned children are taking advantage of her financial situation.    No illicit drugs.   Smokes few cigarettes daily, especially when stressed. Started smoking @ age 61. Quit January 2012 2/2 health but started smoking again 02/2011.       Health Care POA:    Emergency Contact: daughter, Verdene Lennert [phone]   End of Life Plan: gave pt AD info pamphlet   Who lives with you: no one- lives in section 8 housing   Any pets: none   Diet: Pt has a varied diet of protein, starch and vegetables   Exercise: Pt has no regular exercise routine.   Seatbelts: Pt reports wearing seatbelt when in vehicles.    Hobbies: dancing     Social Determinants of Health   Financial Resource Strain:   . Difficulty of Paying Living Expenses:   Food Insecurity:   . Worried About Charity fundraiser in the Last Year:   . Arboriculturist in the Last Year:   Transportation Needs:   . Film/video editor (Medical):   [name] Lack of Transportation (Non-Medical):   Physical Activity:   . Days of Exercise per Week:   . Minutes of Exercise per Session:   Stress:   . Feeling of Stress :   Social Connections:   . Frequency of Communication with Friends and Family:   . Frequency of Social Gatherings with Friends and Family:   . Attends Religious Services:   . Active Member of Clubs or Organizations:   . Attends Archivist Meetings:   [name] Marital Status:   Intimate Partner Violence:   . Fear of Current or Ex-Partner:   . Emotionally Abused:   [name] Physically Abused:   . Sexually Abused:     Ms. Rosello family history includes Alcohol abuse in her brother and father; Aneurysm in her mother; CVA in her mother; Cancer in her brother; Colon cancer in her brother; Diabetes in her brother and sister; Heart attack in her brother, brother, and father; Heart disease in her daughter, mother, and sister; Hypertension in her brother, father, mother, and sister; Stroke in her father and mother.      Objective:    Vitals:   06/14/20 1001  BP: 118/70  Pulse: 72    Physical Exam Well-developed well-nourished  [place] AA female  in no acute distress. , in wheelchair, accompanied by family member Height, Weight130, BMI23  HEENT; nontraumatic normocephalic, EOMI, PER R LA, sclera anicteric. Oropharynx;not examined- trach present Neck; supple, no JVD Cardiovascular; regular rate and rhythm with S1-S2, no murmur rub or gallop Pulmonary; Clear bilaterally Abdomen; soft, nontender, nondistended, no palpable mass or hepatosplenomegaly, bowel sounds are active Rectal;not done today  Skin; benign exam, no jaundice rash or  appreciable lesions Extremities; no clubbing cyanosis or edema skin warm and dry Neuro/Psych; alert and oriented x4, grossly nonfocal mood and affect appropriate       Assessment & Plan:   #59 74 year old African-American female with chronic multifactoral anemia and iron deficiency in setting of chronic kidney disease, polymyalgia rheumatica, and prior  history of gastric AVMs. Patient is continued on intermittent IV iron therapy and EPO. Patient has required recent transfusions. No overt GI bleeding.  Given continued intermittent need for transfusions, and history of multiple adenomatous colon polyps found at colonoscopy 3 years ago, I do think endoscopic evaluation is indicated, for follow-up colonoscopy, and reevaluate her stomach to assess and treat any recurrent gastric AVMs.  #2 chronic anticoagulation-on Eliquis-patient daughter relates CVA was fairly recent #3 COPD 4.  Sleep apnea 5.  History of tracheal stenosis and laryngeal stenosis status post permanent trach 6.  Chronic kidney disease stage III 7.  Left ventricular hypertrophy  Plan; Patient will continue receiving intermittent IV iron and EPO injections via  [facility] Check CBC today We will need to continue serial CBCs at least monthly. Patient will be scheduled for colonoscopy and EGD with Dr. Silverio Decamp  procedures will need to be scheduled at the hospital given her debilitation and trach. Procedures were discussed in detail with the patient including indications risks and benefits and she is agreeable to proceed. She will need to hold Eliquis for 48 hours prior to procedures.  We will communicate with her physician at [facility] to assure this is reasonable for this patient.  [place] Wank S Dawsyn Zurn PA-C 06/19/2020   Cc: [employer], [place]

## 2020-06-20 ENCOUNTER — Other Ambulatory Visit (HOSPITAL_COMMUNITY): Payer: Self-pay

## 2020-06-20 ENCOUNTER — Other Ambulatory Visit: Payer: Self-pay | Admitting: *Deleted

## 2020-06-20 DIAGNOSIS — Z86010 Personal history of colonic polyps: Secondary | ICD-10-CM

## 2020-06-20 DIAGNOSIS — D509 Iron deficiency anemia, unspecified: Secondary | ICD-10-CM

## 2020-06-20 DIAGNOSIS — Z860101 Personal history of adenomatous and serrated colon polyps: Secondary | ICD-10-CM

## 2020-06-20 DIAGNOSIS — D500 Iron deficiency anemia secondary to blood loss (chronic): Secondary | ICD-10-CM

## 2020-06-20 DIAGNOSIS — Z8774 Personal history of (corrected) congenital malformations of heart and circulatory system: Secondary | ICD-10-CM

## 2020-06-20 NOTE — Telephone Encounter (Signed)
Spoke with [name], [phone]  patients daughter explained to her the date and times changes of both procedure and Previsit  Previsit rescheduled for 08/03/2020 at 11am

## 2020-06-20 NOTE — Telephone Encounter (Signed)
Per daughters request sent fax over to inform [facility] the information of when patients colon/egd will be

## 2020-06-20 NOTE — Telephone Encounter (Signed)
Lets get her scheduled for EGD/ Colon in September - off Eliquis x 1.5 days prior

## 2020-06-20 NOTE — Telephone Encounter (Signed)
Had to reschedule patient for her procedure at [facility]  It has been scheduled for 9/21 at 9:30am to arrive at 8am. Will call patient to inform the change  Patient will need to be Covid tested due to it being at [facility]

## 2020-06-20 NOTE — Telephone Encounter (Signed)
Patient is scheduled for EGD/Colon 08/23/2020 and a Previsit appointment on 07/11/2020 at 1:30pm  Patient and son aware of date and times  Patient was ok'd to hold Eliquis 1.5 days  See Phone note........ Patient has been vaccinated for COVID

## 2020-06-21 ENCOUNTER — Other Ambulatory Visit: Payer: Self-pay

## 2020-06-21 ENCOUNTER — Encounter (HOSPITAL_COMMUNITY)
Admission: RE | Admit: 2020-06-21 | Discharge: 2020-06-21 | Disposition: A | Discharge status: Home / Self Care | Payer: Medicare (Managed Care) | Source: Ambulatory Visit | Attending: Nurse Practitioner | Admitting: Nurse Practitioner

## 2020-06-21 DIAGNOSIS — D500 Iron deficiency anemia secondary to blood loss (chronic): Secondary | ICD-10-CM | POA: Insufficient documentation

## 2020-06-21 MED ORDER — SODIUM CHLORIDE 0.9 % IV SOLN
510.0000 mg | INTRAVENOUS | Status: DC
  Administered 2020-06-21: 510 mg via INTRAVENOUS
  Filled 2020-06-21: qty 17

## 2020-06-28 ENCOUNTER — Other Ambulatory Visit: Payer: Self-pay

## 2020-06-28 ENCOUNTER — Ambulatory Visit (HOSPITAL_COMMUNITY)
Admission: RE | Admit: 2020-06-28 | Discharge: 2020-06-28 | Disposition: A | Discharge status: Home / Self Care | Payer: Medicare (Managed Care) | Source: Ambulatory Visit | Attending: Nephrology | Admitting: Nephrology

## 2020-06-28 DIAGNOSIS — N189 Chronic kidney disease, unspecified: Secondary | ICD-10-CM | POA: Insufficient documentation

## 2020-06-28 DIAGNOSIS — D631 Anemia in chronic kidney disease: Secondary | ICD-10-CM | POA: Diagnosis not present

## 2020-06-28 MED ORDER — SODIUM CHLORIDE 0.9 % IV SOLN
510.0000 mg | INTRAVENOUS | Status: AC
  Administered 2020-06-28: 510 mg via INTRAVENOUS
  Filled 2020-06-28: qty 17

## 2020-07-13 NOTE — Progress Notes (Deleted)
NEUROLOGY FOLLOW UP OFFICE NOTE  [name] [id number]  HISTORY OF PRESENT ILLNESS: [name] is a 74 year oldright-handed black woman withlaryngeal stenosis,CHF, complete heart blockand a fibstatus post PPM 1998,chronic atypical chest pain,hypertension, hyperlipidemia, Neri artery disease, depression, history of alcohol and tobacco abuse whofollows up for stroke.  UPDATE: Current medications: Eliquis; Crestor 20mg ; Lasix  She still gets some speech therapy at [facility].  Overall, speech is unchanged.    HISTORY: On 03/11/19, she developed both word-finding difficulty as well as difficulty getting words out. She was able to understand her daughter talking to her. This was associated with headache, dizziness, shortness of breath and diffuse body pain. She did not have visual disturbance or unilateral numbness and weakness. By the next day, she continued to have speech difficulty but other symptoms resolved. She presented to the EDfor further evaluation. CT head was personally reviewed and showed atrophy and small vessel ischemic changes but no acute ischemic or hemorrhagic stroke. She has known atrial fibrillation with PPM who is followed by cardiology but not a candidate for anticoagulation due to prior GI bleed secondary to AVM of stomach and not a candidate for watchman.She was evaluated by neurology who increased ASA from 81mg  to 325mg  daily.Speech has improved but she still has trouble getting words out.CTA of head and neck from 03/17/19 showed no emergent initracranial or extracranial large vessel occlusion or significant stenosis.  She was admitted to the hospital on 05/08/19 for shortness of breath and worsening aphasia. CT of head showed subacute left parietal infarct. She was restarted on Eliquis. Echocardiogram showed EF >65% with no evidence of atrial shunt.  PAST MEDICAL HISTORY: Past Medical History:  Diagnosis Date  .  Anemia   . Angioedema    2/2 ACE  . Arteriovenous malformation of stomach   . Arthritis   . Asthma   . AVM (arteriovenous malformation) of colon    small intestine; stomach  . Blood transfusion   . CHF (congestive heart failure) ([place])   . Complete heart block ([place])    s/p PPM 1998  . Complication of anesthesia    Difficult airway; anaphylaxis and swelling with propofol  . DDD (degenerative disc disease)   . Depression   . Diastolic heart failure   . Fatty liver 07/26/10  . GERD (gastroesophageal reflux disease)   . GI bleed   . History of alcohol abuse Stopped Fall 2012  . History of tobacco use Quit Fall 2012  . Hx of cardiovascular stress test    a. Lexiscan Myoview (10/15):  Small inferolateral and apical defect c/w scar and poss soft tissue attenuation, no ischemia, EF 42%  . Hx of colonic polyp 08/13/10   adenomatous  . Hx of colonoscopy   . Hyperlipidemia   . Hypertension   . Hypertrophic cardiomyopathy ([place])    dx by Dr [name] 2009  . Iron deficiency anemia   . Myocardial infarction ([place])   . Panic attacks   . Permanent atrial fibrillation ([place])   . Renal failure    baseline creatinine 1.6  . Right arm pain 01/08/2012  . RLS (restless legs syndrome)    Dx 06/2007  . Sleep apnea     MEDICATIONS: Current Outpatient Medications on File Prior to Visit  Medication Sig Dispense Refill  . acetaminophen (TYLENOL) 650 MG CR tablet Take 650-1,300 mg by mouth every 8 (eight) hours as needed for pain.    [name] albuterol (PROVENTIL HFA;VENTOLIN HFA) 108 (90 Base) MCG/ACT  inhaler Inhale 1-2 puffs into the lungs every 6 (six) hours as needed for wheezing or shortness of breath. 1 Inhaler 6  . allopurinol (ZYLOPRIM) 100 MG tablet TAKE 2 TABLETS(200 MG) BY MOUTH DAILY 180 tablet 3  . ALPRAZolam (XANAX) 0.5 MG tablet Take 1 tablet (0.5 mg total) by mouth at bedtime as needed for anxiety. 30 tablet 0  . apixaban (ELIQUIS) 5 MG TABS tablet Take 1 tablet (5 mg total) by mouth 2 (two)  times daily. 180 tablet 1  . cetirizine (ZYRTEC) 10 MG tablet Take 10 mg by mouth daily as needed for allergies.     . Cholecalciferol (VITAMIN D) 50 MCG (2000 UT) tablet Take 2,000 Units by mouth daily.    [name] Epoetin Alfa-epbx (RETACRIT IJ) Inject as directed once a week.     . ferrous sulfate 325 (65 FE) MG tablet TAKE 1 TABLET BY MOUTH TWICE DAILY WITH MEALS TO KEEP BLOOD COUNT UP 60 tablet 11  . furosemide (LASIX) 20 MG tablet Take 1 tablet (20 mg total) by mouth daily. Daily as needed for weight gain of 2lbs in a day or 5lbs in a week 30 tablet 6  . ipratropium-albuterol (DUONEB) 0.5-2.5 (3) MG/3ML SOLN USE 3 ML VIA NEBULIZER EVERY 6 HOURS 360 mL 0  . latanoprost (XALATAN) 0.005 % ophthalmic solution Place 1 drop into both eyes at bedtime.    [name] loratadine (CLARITIN) 10 MG tablet Take 1 tablet (10 mg total) by mouth daily. One po daily x 5 days 14 tablet 0  . metoprolol tartrate (LOPRESSOR) 25 MG tablet Take 1 tablet (25 mg total) by mouth 2 (two) times daily. 60 tablet 5  . nitroGLYCERIN (NITROSTAT) 0.4 MG SL tablet Place 1 tablet (0.4 mg total) under the tongue every 5 (five) minutes as needed for chest pain. 25 tablet 1  . Olopatadine HCl 0.2 % SOLN Place 1-2 drops into both eyes daily as needed (itching).   1  . oxymetazoline (AFRIN NASAL SPRAY) 0.05 % nasal spray Place 1 spray into both nostrils 2 (two) times daily. (Patient taking differently: Place 1 spray into both nostrils as needed. ) 30 mL 0  . potassium chloride (K-DUR) 10 MEQ tablet Take 1 tablet (10 mEq total) by mouth daily. 90 tablet 0  . rosuvastatin (CRESTOR) 20 MG tablet Take 1 tablet (20 mg total) by mouth at bedtime. 90 tablet 3  . sodium chloride (OCEAN) 0.65 % SOLN nasal spray Place 1 spray into both nostrils daily as needed for congestion.     [name] STIMULANT LAXATIVE 8.6-50 MG tablet Take 2 tablets by mouth at bedtime as needed for mild constipation.     . triamcinolone cream (KENALOG) 0.1 % Apply 1 application topically 2  (two) times daily. 30 g 2   No current facility-administered medications on file prior to visit.    ALLERGIES: Allergies  Allergen Reactions  . Ace Inhibitors Swelling    angiodema  . Other Anaphylaxis and Swelling    Reaction to unspecified anesthesia     FAMILY HISTORY: Family History  Problem Relation Age of Onset  . Hypertension Mother   . Stroke Mother   . Heart disease Mother   . Aneurysm Mother   . CVA Mother   . Hypertension Father   . Stroke Father   . Heart attack Father   . Alcohol abuse Father   . Hypertension Sister   . Hypertension Brother   . Colon cancer Brother   . Cancer Brother   .  Diabetes Sister   . Diabetes Brother   . Heart attack Brother   . Heart attack Brother   . Heart disease Sister   . Alcohol abuse Brother   . Heart disease Daughter     SOCIAL HISTORY: Social History   Socioeconomic History  . Marital status: Divorced    Spouse name: Not on file  . Number of children: 4  . Years of education: 9  . Highest education level: Not on file  Occupational History  . Occupation: disabled    Fish farm manager: UNEMPLOYED  . Occupation: previously- Development worker, community  Tobacco Use  . Smoking status: Former Smoker    Packs/day: 0.10    Years: 50.00    Pack years: 5.00    Types: Cigarettes    Quit date: 08/16/2011    Years since quitting: 8.9  . Smokeless tobacco: Never Used  . Tobacco comment: quit 2012  Vaping Use  . Vaping Use: Never used  Substance and Sexual Activity  . Alcohol use: No    Comment: quit 08/16/2011  . Drug use: No  . Sexual activity: Never  Other Topics Concern  . Not on file  Social History Narrative   On disability for heart failure/pacemaker.     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    4 grown children,  8 grandchildren.     Drinks alcohol a few times a week socially. At one time, the most she reports drinking is about 3 mixed drinks.   Lives with daughter [name] and son. Will be moving 04/2011 to  different house because concerned children are taking advantage of her financial situation.    No illicit drugs.   Smokes few cigarettes daily, especially when stressed. Started smoking @ age 62. Quit January 2012 2/2 health but started smoking again 02/2011.       Health Care POA:    Emergency Contact: daughter, Verdene Lennert [phone]   End of Life Plan: gave pt AD info pamphlet   Who lives with you: no one- lives in section 8 housing   Any pets: none   Diet: Pt has a varied diet of protein, starch and vegetables   Exercise: Pt has no regular exercise routine.   Seatbelts: Pt reports wearing seatbelt when in vehicles.    Hobbies: dancing   Social Determinants of Health   Financial Resource Strain:   . Difficulty of Paying Living Expenses:   Food Insecurity:   . Worried About Charity fundraiser in the Last Year:   . Arboriculturist in the Last Year:   Transportation Needs:   . Film/video editor (Medical):   [name] Lack of Transportation (Non-Medical):   Physical Activity:   . Days of Exercise per Week:   . Minutes of Exercise per Session:   Stress:   . Feeling of Stress :   Social Connections:   . Frequency of Communication with Friends and Family:   . Frequency of Social Gatherings with Friends and Family:   . Attends Religious Services:   . Active Member of Clubs or Organizations:   . Attends Archivist Meetings:   [name] Marital Status:   Intimate Partner Violence:   . Fear of Current or Ex-Partner:   . Emotionally Abused:   [name] Physically Abused:   . Sexually Abused:      PHYSICAL EXAM: *** General: No acute distress.  Patient appears well-groomed.   Head:  Normocephalic/atraumatic Eyes:  Fundi examined but not visualized Neck:  supple, no paraspinal tenderness, full range of motion Heart:  Regular rate and rhythm Lungs:  Clear to auscultation bilaterally Back: No paraspinal tenderness Neurological Exam: alert and oriented to person, place, and time. Attention  span and concentration intact, recent and remote memory intact, fund of knowledge intact.  Speech fluent and not dysarthric, language intact.  CN II-XII intact. Bulk and tone normal, muscle strength 5/5 throughout.  Sensation to light touch, temperature and vibration intact.  Deep tendon reflexes 2+ throughout, toes downgoing.  Finger to nose and heel to shin testing intact.  Gait normal, Romberg negative.  IMPRESSION: 1.  Aphasia as late effect of left parietal infarct, likely embolic secondary to atrial fibrillation 2.  Atrial fibrillation 3.  Hypertension 4.  Hyperlipidemia  PLAN: 1.  Secondary stroke prevention as managed by PCP/cardiology  -  Eliquis  -  Crestor (LDL goal less than 70)  - Blood pressure management  -  Glycemic control (Hgb A1c goal less than 7)  -  Mediterranean diet 2.  Follow up as needed.  Metta Clines, DO  CC:  [facility]

## 2020-07-16 ENCOUNTER — Ambulatory Visit: Payer: Medicare (Managed Care) | Admitting: Neurology

## 2020-07-17 NOTE — Progress Notes (Signed)
Reviewed and agree with documentation and assessment and plan. K. Veena Biannca Scantlin , MD   

## 2020-07-27 ENCOUNTER — Encounter (INDEPENDENT_AMBULATORY_CARE_PROVIDER_SITE_OTHER): Payer: Medicare Other | Admitting: Ophthalmology

## 2020-08-17 ENCOUNTER — Ambulatory Visit (AMBULATORY_SURGERY_CENTER): Payer: Self-pay | Admitting: *Deleted

## 2020-08-17 ENCOUNTER — Other Ambulatory Visit: Payer: Self-pay

## 2020-08-17 VITALS — Ht 62.4 in | Wt 124.2 lb

## 2020-08-17 DIAGNOSIS — D509 Iron deficiency anemia, unspecified: Secondary | ICD-10-CM

## 2020-08-17 DIAGNOSIS — Z86010 Personal history of colonic polyps: Secondary | ICD-10-CM

## 2020-08-17 DIAGNOSIS — Z01818 Encounter for other preprocedural examination: Secondary | ICD-10-CM

## 2020-08-17 MED ORDER — NA SULFATE-K SULFATE-MG SULF 17.5-3.13-1.6 GM/177ML PO SOLN: 1.0000 | Freq: Once | ORAL | 0 refills | Status: AC

## 2020-08-17 NOTE — Progress Notes (Signed)
Suprep prescription faxed to [facility] [phone], received accepted notification off fax.

## 2020-08-17 NOTE — Progress Notes (Signed)
No egg or soy allergy known to patient  No issues with past sedation with any surgeries or procedures no intubation problems in the past  No FH of Malignant Hyperthermia No diet pills per patient No home 02 use per patient  No blood thinners per patient  Pt denies issues with constipation  No A fib or A flutter  EMMI video to pt or via [place] 19 guidelines implemented in PV today with Pt and RN    SEVERE REACTION TO PROPOFOL 3 YEARS AGO [place]   Due to the COVID-19 pandemic we are asking patients to follow these guidelines. Please only bring one care partner. Please be aware that your care partner may wait in the car in the parking lot or if they feel like they will be too hot to wait in the car, they may wait in the lobby on the 4th floor. All care partners are required to wear a mask the entire time (we do not have any that we can provide them), they need to practice social distancing, and we will do a Covid check for all patient's and care partners when you arrive. Also we will check their temperature and your temperature. If the care partner waits in their car they need to stay in the parking lot the entire time and we will call them on their cell phone when the patient is ready for discharge so they can bring the car to the front of the building. Also all patient's will need to wear a mask into building.

## 2020-08-21 ENCOUNTER — Ambulatory Visit: Payer: Medicare (Managed Care) | Admitting: Cardiovascular Disease

## 2020-08-23 ENCOUNTER — Encounter: Payer: Medicare (Managed Care) | Admitting: Gastroenterology

## 2020-08-23 NOTE — Progress Notes (Signed)
[facility] Note  08/28/2020     CHIEF COMPLAINT Patient presents for Retina Evaluation   HISTORY OF PRESENT ILLNESS: [name] is a 74 y.o. female who presents to the clinic today for:   HPI    Retina Evaluation    In left eye.  I, the attending physician,  performed the HPI with the patient and updated documentation appropriately.          Comments    Patient here for retina Evaluation. Referred by Dr. Herbert Deaner for poss macro aneurysm OS. Patient states vision much better that was before cat sx. Has eye pain.        Last edited by [name], MD on 08/28/2020  1:20 PM. (History)    pt is here on the referral of Dr. Herbert Deaner for concern of macro aneurysm, pt had cataract sx with Dr. Herbert Deaner OU (OS: 06.21, OD: 09.07), pts daughter states vision has improved in both eyes since sx, daughter states pt is using Ketorolac QID OS through this month, pt takes latanoprost for glaucoma, pt has an appt with Dr. 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 Herbert Deaner for ret eval   CURRENT MEDICATIONS: Current Outpatient Medications (Ophthalmic Drugs)  Medication Sig  . brimonidine (ALPHAGAN) 0.2 % ophthalmic solution Place 1 drop into the right eye 2 (two) times daily.  [name] ketorolac (ACULAR) 0.5 % ophthalmic solution Place 1 drop into the left eye 4 (four) times daily. Until 9/28  . latanoprost (XALATAN) 0.005 % ophthalmic solution Place 1 drop into both eyes at bedtime.  [name] ofloxacin (OCUFLOX) 0.3 % ophthalmic solution Place 1 drop into the right eye 4 (four) times daily.  . Olopatadine HCl 0.2 % SOLN Place 1-2 drops into both eyes daily as needed (itching).   . prednisoLONE acetate  (PRED FORTE) 1 % ophthalmic suspension Place 1 drop into the right eye 4 (four) times daily.   No current facility-administered medications for this visit. (Ophthalmic Drugs)   Current Outpatient Medications (Other)  Medication Sig  . acetaminophen (TYLENOL) 650 MG CR tablet Take 650 mg by mouth every 8 (eight) hours as needed for pain.   [name] albuterol (PROVENTIL HFA;VENTOLIN HFA) 108 (90 Base) MCG/ACT inhaler Inhale 1-2 puffs into the lungs every 6 (six) hours as needed for wheezing or shortness of breath.  . allopurinol (ZYLOPRIM) 100 MG tablet TAKE 2 TABLETS(200 MG) BY MOUTH DAILY (Patient taking differently: Take 200 mg by mouth daily. )  . ALPRAZolam (XANAX) 0.5 MG tablet Take 1 tablet (0.5 mg total) by mouth at bedtime as needed for anxiety.  [name] apixaban (ELIQUIS) 5 MG TABS tablet Take 1 tablet (5 mg total) by mouth 2 (two) times daily.  . cetirizine (ZYRTEC) 10 MG tablet Take 10 mg by mouth daily as needed for allergies.   . Cholecalciferol (VITAMIN D) 50 MCG (2000 UT) tablet Take 2,000 Units by mouth daily.  [name] Epoetin Alfa-epbx (RETACRIT IJ) Inject 1 Dose as directed once a week.   . ferrous sulfate 325 (65 FE) MG tablet TAKE 1 TABLET BY MOUTH TWICE DAILY WITH MEALS TO KEEP BLOOD COUNT UP (Patient taking differently: Take 325 mg by mouth 2 (two) times daily with  a meal. )  . furosemide (LASIX) 20 MG tablet Take 1 tablet (20 mg total) by mouth daily. Daily as needed for weight gain of 2lbs in a day or 5lbs in a week (Patient taking differently: Take 20 mg by mouth daily. )  . ipratropium-albuterol (DUONEB) 0.5-2.5 (3) MG/3ML SOLN USE 3 ML VIA NEBULIZER EVERY 6 HOURS (Patient taking differently: Inhale 3 mLs into the lungs every 6 (six) hours as needed (shortness of breath). )  . loratadine (CLARITIN) 10 MG tablet Take 1 tablet (10 mg total) by mouth daily. One po daily x 5 days (Patient not taking: Reported on 08/28/2020)  . metoprolol tartrate (LOPRESSOR) 25 MG tablet Take 1 tablet (25 mg  total) by mouth 2 (two) times daily.  . mirtazapine (REMERON) 7.5 MG tablet Take 7.5 mg by mouth at bedtime.  . nitroGLYCERIN (NITROSTAT) 0.4 MG SL tablet Place 1 tablet (0.4 mg total) under the tongue every 5 (five) minutes as needed for chest pain.  [name] oxymetazoline (AFRIN NASAL SPRAY) 0.05 % nasal spray Place 1 spray into both nostrils 2 (two) times daily. (Patient taking differently: Place 1 spray into both nostrils 2 (two) times daily as needed for congestion. )  . Polyethylene Glycol 3350 (MIRALAX PO) Take 118 g by mouth once. Part of colonoscopy 2 day prep  . potassium chloride (K-DUR) 10 MEQ tablet Take 1 tablet (10 mEq total) by mouth daily.  . rosuvastatin (CRESTOR) 20 MG tablet Take 1 tablet (20 mg total) by mouth at bedtime.  . sodium chloride (OCEAN) 0.65 % SOLN nasal spray Place 1 spray into both nostrils daily as needed for congestion.   [name] STIMULANT LAXATIVE 8.6-50 MG tablet Take 2 tablets by mouth at bedtime as needed for mild constipation.   . triamcinolone cream (KENALOG) 0.1 % Apply 1 application topically 2 (two) times daily. (Patient taking differently: Apply 1 application topically 2 (two) times daily as needed (lip rash). )   No current facility-administered medications for this visit. (Other)      REVIEW OF SYSTEMS: ROS    Positive for: Eyes   Last edited by Theodore Demark, COA on 08/28/2020  1:10 PM. (History)       ALLERGIES Allergies  Allergen Reactions  . Ace Inhibitors Swelling    angiodema  . Other Anaphylaxis and Swelling    Reaction to unspecified anesthesia   . Propofol Anaphylaxis    PAST MEDICAL HISTORY Past Medical History:  Diagnosis Date  . Anemia   . Angioedema    2/2 ACE  . Arteriovenous malformation of stomach   . Arthritis   . Asthma   . AVM (arteriovenous malformation) of colon    small intestine; stomach  . Blood transfusion   . CHF (congestive heart failure) ([place])   . Complete heart block ([place])    s/p PPM 1998  .  Complication of anesthesia    Difficult airway; anaphylaxis and swelling with propofol  . DDD (degenerative disc disease)   . Depression   . Diastolic heart failure   . Fatty liver 07/26/10  . GERD (gastroesophageal reflux disease)   . GI bleed   . History of alcohol abuse Stopped Fall 2012  . History of tobacco use Quit Fall 2012  . Hx of cardiovascular stress test    a. Lexiscan Myoview (10/15):  Small inferolateral and apical defect c/w scar and poss soft tissue attenuation, no ischemia, EF 42%  . Hx of colonic polyp 08/13/10   adenomatous  . Hx  of colonoscopy   . Hyperlipidemia   . Hypertension   . Hypertrophic cardiomyopathy ([place])    dx by Dr [name] 2009  . Iron deficiency anemia   . Myocardial infarction ([place])   . Pacemaker   . Panic attacks   . Permanent atrial fibrillation ([place])   . Renal failure    baseline creatinine 1.6  . Right arm pain 01/08/2012  . RLS (restless legs syndrome)    Dx 06/2007  . Sleep apnea    no cpap    Past Surgical History:  Procedure Laterality Date  . CARDIAC CATHETERIZATION    . CARDIAC CATHETERIZATION N/A 08/19/2016   Procedure: Right/Left Heart Cath and Coronary Angiography;  Surgeon: Jolaine Artist, MD;  Location: [facility];  Service: Cardiovascular;  Laterality: N/A;  . COLONOSCOPY WITH PROPOFOL N/A 06/16/2017   Procedure: COLONOSCOPY WITH PROPOFOL;  Surgeon: Mauri Pole, MD;  Location: [facility];  Service: Endoscopy;  Laterality: N/A;  . DIRECT LARYNGOSCOPY N/A 09/18/2016   Procedure: DIRECT LARYNGOSCOPY, BRONCHOSCOPY, REMOVAL OF INTUBATION GRANULOMA;  Surgeon: [name], MD;  [facility];  Service: ENT;  Laterality: N/A;  . DIRECT LARYNGOSCOPY N/A 10/20/2016   Procedure: EXTUBATION AND FLEXIBLE LARYNGOSCOPE;  Surgeon: [name], MD;  Location: [facility];  Service: ENT;  Laterality: N/A;  . DIRECT LARYNGOSCOPY N/A 10/29/2016   Procedure: DIRECT LARYNGOSCOPY;  Surgeon: [name], MD;  Location[facility];   Service: ENT;  Laterality: N/A;  . ESOPHAGOGASTRODUODENOSCOPY  12/23/2011   Procedure: ESOPHAGOGASTRODUODENOSCOPY (EGD);  Surgeon: [name], MD;  Location: [facility];  Service: Endoscopy;  Laterality: N/A;  . ESOPHAGOGASTRODUODENOSCOPY (EGD) WITH PROPOFOL N/A 06/16/2017   Procedure: ESOPHAGOGASTRODUODENOSCOPY (EGD) WITH PROPOFOL;  Surgeon: Mauri Pole, MD;  Location: [facility];  Service: Endoscopy;  Laterality: N/A;  . EXTUBATION (ENDOTRACHEAL) IN OR N/A 07/21/2016   Procedure: EXTUBATION (ENDOTRACHEAL) IN OR;  Surgeon: [name], MD;  Location: [facility];  Service: ENT;  Laterality: N/A;  . GIVENS CAPSULE STUDY  12/23/2011   Procedure: GIVENS CAPSULE STUDY;  Surgeon: [name], MD;  Location: [facility];  Service: Endoscopy;  Laterality: N/A;  . MICROLARYNGOSCOPY WITH LASER N/A 10/16/2016   Procedure: MICRODIRECTLARYNGOSCOPY WITH LASER ABLATION AND KENLOG INJECTION;  Surgeon: [name], MD;  Location: [facility];  Service: ENT;  Laterality: N/A;  . PACEMAKER INSERTION  1998   st jude, most recent gen change by [name] 4/12  . TRACHEOSTOMY TUBE PLACEMENT N/A 10/29/2016   Procedure: TRACHEOSTOMY;  Surgeon: [name], MD;  Location: [facility];  Service: ENT;  Laterality: N/A;  . TUBAL LIGATION  04/01/2000    FAMILY HISTORY Family History  Problem Relation Age of Onset  . Hypertension Mother   . Stroke Mother   . Heart disease Mother   . Aneurysm Mother   . CVA Mother   . Hypertension Father   . Stroke Father   . Heart attack Father   . Alcohol abuse Father   . Hypertension Sister   . Hypertension Brother   . Colon cancer Brother   . Cancer Brother   . Diabetes Sister   . Diabetes Brother   . Heart attack Brother   . Heart attack Brother   . Heart disease Sister   . Alcohol abuse Brother   . Heart disease Daughter   . Esophageal cancer Neg Hx   . Stomach cancer Neg Hx   . Rectal cancer Neg Hx     SOCIAL HISTORY Social History  Tobacco Use  . Smoking status:  Former Smoker    Packs/day: 0.10    Years: 50.00    Pack years: 5.00    Types: Cigarettes    Quit date: 08/16/2011    Years since quitting: 9.0  . Smokeless tobacco: Never Used  . Tobacco comment: quit 2012  Vaping Use  . Vaping Use: Never used  Substance Use Topics  . Alcohol use: No    Comment: quit 08/16/2011  . Drug use: No         OPHTHALMIC EXAM:  Base Eye Exam    Visual Acuity (Snellen - Linear)      Right Left   Dist [place] 20/40 +2 20/50 -2   Dist ph [place] 20/25 -2 20/30 -2       Tonometry (Tonopen, 1:06 PM)      Right Left   Pressure 10 07       Pupils      Dark Light Shape React APD   Right 3 2 Round Minimal None   Left 1 1 Round Minimal None       Visual Fields (Counting fingers)      Left Right    Full Full       Extraocular Movement      Right Left    Full, Ortho Full, Ortho       Neuro/Psych    Oriented x3: Yes   Mood/Affect: Normal       Dilation    Both eyes: 1.0% Mydriacyl, 2.5% Phenylephrine @ 1:06 PM        Slit Lamp and Fundus Exam    Slit Lamp Exam      Right Left   Lids/Lashes Dermatochalasis - upper lid Dermatochalasis - upper lid   Conjunctiva/Sclera Melanosis Melanosis, temporal pinguecula   Cornea arcus, trace Punctate epithelial erosions, well healed ST cataract wounds arcus, 2-3+Punctate epithelial erosions, well healed ST cataract wounds   Anterior Chamber deep, 0.5+cell/pigment Deep and quiet   Iris Round and dilated Round and dilated   Lens PC IOL in good position PC IOL in good position, trace Posterior capsular opacification   Vitreous Mild Vitreous syneresis Mild Vitreous syneresis       Fundus Exam      Right Left   Disc mild Pallor, Sharp rim, +PPP/PPA mild Pallor, Sharp rim, 360 PPA   C/D Ratio 0.55 0.65   Macula Flat, Blunted foveal reflex, mild RPE mottling, trace ERM Flat, Blunted foveal reflex, mild RPE mottling, trace ERM   Vessels Vascular attenuation, Tortuous, mild Copper wiring, mild AV crossing changes  Vascular attenuation, Tortuous, Copper wiring, macro aneurysms and beading along ST arteriole -- no heme or edema   Periphery Attached    Attached           Refraction    Manifest Refraction      Sphere Cylinder Axis Dist VA   Right +1.75 +1.25 025 20/40-2   Left -2.00 +0.50 150 20/40          IMAGING AND PROCEDURES  Imaging and Procedures for 08/28/2020  OCT, Retina - OU - Both Eyes       Right Eye Quality was good. Central Foveal Thickness: 229. Progression has no prior data. Findings include normal foveal contour, no IRF, no SRF (Partial PVD; focal druse nasal macula).   Left Eye Quality was good. Central Foveal Thickness: 225. Progression has no prior data. Findings include abnormal foveal contour, no IRF, no SRF (Foveal notch nasal fovea, partial  PVD).   Notes *Images captured and stored on drive  Diagnosis / Impression:  NFP, no IRF/SRF OU  Clinical management:  See below  Abbreviations: NFP - Normal foveal profile. CME - cystoid macular edema. PED - pigment epithelial detachment. IRF - intraretinal fluid. SRF - subretinal fluid. EZ - ellipsoid zone. ERM - epiretinal membrane. ORA - outer retinal atrophy. ORT - outer retinal tubulation. SRHM - subretinal hyper-reflective material. IRHM - intraretinal hyper-reflective material                 ASSESSMENT/PLAN:    ICD-10-CM   1. Hypertensive retinopathy of both eyes  H35.033   2. Essential hypertension  I10   3. Retinal edema  H35.81 OCT, Retina - OU - Both Eyes  4. Pseudophakia, both eyes  Z96.1   5. Primary open angle glaucoma of both eyes, unspecified glaucoma stage  H40.1130    1,2. Hypertensive retinopathy OU  - exam shows 3+ tortuous vessels OU + OS with beading / early macroaneurysms of sup temp arteriole  - no frank heme or surrounding edema - discussed importance of tight BP control - monitor - f/u 3-4 wks -- DFE/OCT, FA transit OS  3. No retinal edema on exam or OCT  4. Pseudophakia OU  -  s/p CE/IOL OU (Dr. Herbert Deaner, OS 6.1.21, OD 9.7.21)  - IOLs in good position, doing well  - monitor  5. Glaucoma  - under the expert management of Dr. Herbert Deaner  - IOP today 10,07  - on latanoprost QHS   Ophthalmic Meds Ordered this visit:  No orders of the defined types were placed in this encounter.      Return for 3-4 wks, Dilated Exam, OCT, Fluorescein Angiogram OS.  There are no Patient Instructions on file for this visit.   Explained the diagnoses, plan, and follow up with the patient and they expressed understanding.  Patient expressed understanding of the importance of proper follow up care.  This document serves as a record of services personally performed by Gardiner Sleeper, MD, PhD. It was created on their behalf by Estill Bakes, COT an ophthalmic technician. The creation of this record is the provider's dictation and/or activities during the visit.    Electronically signed by: Estill Bakes, COT 9.9.21 @ 4:15 PM   This document serves as a record of services personally performed by Gardiner Sleeper, MD, PhD. It was created on their behalf by San Jetty. Owens Shark, OA an ophthalmic technician. The creation of this record is the provider's dictation and/or activities during the visit.    Electronically signed by: San Jetty. Owens Shark, [place] 09.14.2021 4:15 PM   Gardiner Sleeper, M.D., Ph.D. Diseases & Surgery of the Retina and Vitreous [facility]  I have reviewed the above documentation for accuracy and completeness, and I agree with the above. Gardiner Sleeper, M.D., Ph.D. 08/28/20 4:15 PM   Abbreviations: M myopia (nearsighted); A astigmatism; H hyperopia (farsighted); P presbyopia; Mrx spectacle prescription;  CTL contact lenses; OD right eye; OS left eye; OU both eyes  XT exotropia; ET esotropia; PEK punctate epithelial keratitis; PEE punctate epithelial erosions; DES dry eye syndrome; MGD meibomian gland dysfunction; ATs artificial tears; PFAT's preservative  free artificial tears; [place] nuclear sclerotic cataract; PSC posterior subcapsular cataract; ERM epi-retinal membrane; PVD posterior vitreous detachment; RD retinal detachment; DM diabetes mellitus; DR diabetic retinopathy; NPDR non-proliferative diabetic retinopathy; PDR proliferative diabetic retinopathy; CSME clinically significant macular edema; DME diabetic macular edema; dbh dot blot  hemorrhages; CWS cotton wool spot; POAG primary open angle glaucoma; C/D cup-to-disc ratio; HVF humphrey visual field; GVF goldmann visual field; OCT optical coherence tomography; IOP intraocular pressure; BRVO Branch retinal vein occlusion; CRVO central retinal vein occlusion; CRAO central retinal artery occlusion; BRAO branch retinal artery occlusion; RT retinal tear; SB scleral buckle; PPV pars plana vitrectomy; VH Vitreous hemorrhage; PRP panretinal laser photocoagulation; IVK intravitreal kenalog; VMT vitreomacular traction; MH Macular hole;  NVD neovascularization of the disc; NVE neovascularization elsewhere; AREDS age related eye disease study; ARMD age related macular degeneration; POAG primary open angle glaucoma; EBMD epithelial/anterior basement membrane dystrophy; ACIOL anterior chamber intraocular lens; IOL intraocular lens; PCIOL posterior chamber intraocular lens; Phaco/IOL phacoemulsification with intraocular lens placement; [place] photorefractive keratectomy; LASIK laser assisted in situ keratomileusis; HTN hypertension; DM diabetes mellitus; COPD chronic obstructive pulmonary disease

## 2020-08-28 ENCOUNTER — Other Ambulatory Visit: Payer: Self-pay

## 2020-08-28 ENCOUNTER — Ambulatory Visit (INDEPENDENT_AMBULATORY_CARE_PROVIDER_SITE_OTHER): Payer: Medicare (Managed Care) | Admitting: Ophthalmology

## 2020-08-28 ENCOUNTER — Encounter (INDEPENDENT_AMBULATORY_CARE_PROVIDER_SITE_OTHER): Payer: Self-pay | Admitting: Ophthalmology

## 2020-08-28 DIAGNOSIS — H35033 Hypertensive retinopathy, bilateral: Secondary | ICD-10-CM

## 2020-08-28 DIAGNOSIS — Z961 Presence of intraocular lens: Secondary | ICD-10-CM

## 2020-08-28 DIAGNOSIS — H401130 Primary open-angle glaucoma, bilateral, stage unspecified: Secondary | ICD-10-CM

## 2020-08-28 DIAGNOSIS — I10 Essential (primary) hypertension: Secondary | ICD-10-CM | POA: Diagnosis not present

## 2020-08-28 DIAGNOSIS — H3581 Retinal edema: Secondary | ICD-10-CM | POA: Diagnosis not present

## 2020-08-31 ENCOUNTER — Other Ambulatory Visit (HOSPITAL_COMMUNITY)
Admission: RE | Admit: 2020-08-31 | Discharge: 2020-08-31 | Disposition: A | Discharge status: Home / Self Care | Payer: Medicare (Managed Care) | Source: Ambulatory Visit | Attending: Gastroenterology | Admitting: Gastroenterology

## 2020-08-31 DIAGNOSIS — Z20822 Contact with and (suspected) exposure to covid-19: Secondary | ICD-10-CM | POA: Diagnosis not present

## 2020-08-31 DIAGNOSIS — Z01812 Encounter for preprocedural laboratory examination: Secondary | ICD-10-CM | POA: Insufficient documentation

## 2020-09-01 LAB — SARS CORONAVIRUS 2 (TAT 6-24 HRS): SARS Coronavirus 2: NEGATIVE

## 2020-09-04 ENCOUNTER — Encounter (HOSPITAL_COMMUNITY)
Admission: RE | Disposition: A | Discharge status: Home / Self Care | Payer: Self-pay | Source: Home / Self Care | Attending: Gastroenterology

## 2020-09-04 ENCOUNTER — Ambulatory Visit (HOSPITAL_COMMUNITY): Payer: Medicare (Managed Care) | Admitting: Anesthesiology

## 2020-09-04 ENCOUNTER — Other Ambulatory Visit: Payer: Self-pay

## 2020-09-04 ENCOUNTER — Ambulatory Visit (HOSPITAL_COMMUNITY)
Admission: RE | Admit: 2020-09-04 | Discharge: 2020-09-04 | Disposition: A | Discharge status: Home / Self Care | Payer: Medicare (Managed Care) | Attending: Gastroenterology | Admitting: Gastroenterology

## 2020-09-04 ENCOUNTER — Encounter (HOSPITAL_COMMUNITY): Payer: Self-pay | Admitting: Gastroenterology

## 2020-09-04 DIAGNOSIS — J449 Chronic obstructive pulmonary disease, unspecified: Secondary | ICD-10-CM | POA: Insufficient documentation

## 2020-09-04 DIAGNOSIS — K648 Other hemorrhoids: Secondary | ICD-10-CM | POA: Diagnosis not present

## 2020-09-04 DIAGNOSIS — F329 Major depressive disorder, single episode, unspecified: Secondary | ICD-10-CM | POA: Insufficient documentation

## 2020-09-04 DIAGNOSIS — D123 Benign neoplasm of transverse colon: Secondary | ICD-10-CM | POA: Insufficient documentation

## 2020-09-04 DIAGNOSIS — Z87891 Personal history of nicotine dependence: Secondary | ICD-10-CM | POA: Insufficient documentation

## 2020-09-04 DIAGNOSIS — K259 Gastric ulcer, unspecified as acute or chronic, without hemorrhage or perforation: Secondary | ICD-10-CM | POA: Insufficient documentation

## 2020-09-04 DIAGNOSIS — F419 Anxiety disorder, unspecified: Secondary | ICD-10-CM | POA: Insufficient documentation

## 2020-09-04 DIAGNOSIS — K219 Gastro-esophageal reflux disease without esophagitis: Secondary | ICD-10-CM | POA: Insufficient documentation

## 2020-09-04 DIAGNOSIS — Z8774 Personal history of (corrected) congenital malformations of heart and circulatory system: Secondary | ICD-10-CM

## 2020-09-04 DIAGNOSIS — K760 Fatty (change of) liver, not elsewhere classified: Secondary | ICD-10-CM | POA: Insufficient documentation

## 2020-09-04 DIAGNOSIS — R519 Headache, unspecified: Secondary | ICD-10-CM | POA: Diagnosis not present

## 2020-09-04 DIAGNOSIS — G2581 Restless legs syndrome: Secondary | ICD-10-CM | POA: Insufficient documentation

## 2020-09-04 DIAGNOSIS — D125 Benign neoplasm of sigmoid colon: Secondary | ICD-10-CM | POA: Diagnosis not present

## 2020-09-04 DIAGNOSIS — Z791 Long term (current) use of non-steroidal anti-inflammatories (NSAID): Secondary | ICD-10-CM | POA: Insufficient documentation

## 2020-09-04 DIAGNOSIS — Z950 Presence of cardiac pacemaker: Secondary | ICD-10-CM | POA: Diagnosis not present

## 2020-09-04 DIAGNOSIS — I422 Other hypertrophic cardiomyopathy: Secondary | ICD-10-CM | POA: Insufficient documentation

## 2020-09-04 DIAGNOSIS — I209 Angina pectoris, unspecified: Secondary | ICD-10-CM | POA: Insufficient documentation

## 2020-09-04 DIAGNOSIS — R0602 Shortness of breath: Secondary | ICD-10-CM | POA: Insufficient documentation

## 2020-09-04 DIAGNOSIS — K31819 Angiodysplasia of stomach and duodenum without bleeding: Secondary | ICD-10-CM | POA: Diagnosis not present

## 2020-09-04 DIAGNOSIS — K6389 Other specified diseases of intestine: Secondary | ICD-10-CM | POA: Insufficient documentation

## 2020-09-04 DIAGNOSIS — Z79899 Other long term (current) drug therapy: Secondary | ICD-10-CM | POA: Insufficient documentation

## 2020-09-04 DIAGNOSIS — I110 Hypertensive heart disease with heart failure: Secondary | ICD-10-CM | POA: Diagnosis not present

## 2020-09-04 DIAGNOSIS — D509 Iron deficiency anemia, unspecified: Secondary | ICD-10-CM | POA: Diagnosis not present

## 2020-09-04 DIAGNOSIS — I5032 Chronic diastolic (congestive) heart failure: Secondary | ICD-10-CM | POA: Insufficient documentation

## 2020-09-04 DIAGNOSIS — K635 Polyp of colon: Secondary | ICD-10-CM | POA: Diagnosis not present

## 2020-09-04 DIAGNOSIS — Z8673 Personal history of transient ischemic attack (TIA), and cerebral infarction without residual deficits: Secondary | ICD-10-CM | POA: Insufficient documentation

## 2020-09-04 DIAGNOSIS — M1990 Unspecified osteoarthritis, unspecified site: Secondary | ICD-10-CM | POA: Insufficient documentation

## 2020-09-04 DIAGNOSIS — D500 Iron deficiency anemia secondary to blood loss (chronic): Secondary | ICD-10-CM

## 2020-09-04 DIAGNOSIS — Z8249 Family history of ischemic heart disease and other diseases of the circulatory system: Secondary | ICD-10-CM | POA: Insufficient documentation

## 2020-09-04 DIAGNOSIS — I080 Rheumatic disorders of both mitral and aortic valves: Secondary | ICD-10-CM | POA: Diagnosis not present

## 2020-09-04 DIAGNOSIS — Z800 Family history of malignant neoplasm of digestive organs: Secondary | ICD-10-CM | POA: Insufficient documentation

## 2020-09-04 DIAGNOSIS — I4891 Unspecified atrial fibrillation: Secondary | ICD-10-CM | POA: Diagnosis not present

## 2020-09-04 DIAGNOSIS — M5136 Other intervertebral disc degeneration, lumbar region: Secondary | ICD-10-CM | POA: Insufficient documentation

## 2020-09-04 DIAGNOSIS — I252 Old myocardial infarction: Secondary | ICD-10-CM | POA: Diagnosis not present

## 2020-09-04 DIAGNOSIS — Z811 Family history of alcohol abuse and dependence: Secondary | ICD-10-CM | POA: Insufficient documentation

## 2020-09-04 DIAGNOSIS — M353 Polymyalgia rheumatica: Secondary | ICD-10-CM | POA: Insufficient documentation

## 2020-09-04 DIAGNOSIS — D124 Benign neoplasm of descending colon: Secondary | ICD-10-CM | POA: Diagnosis not present

## 2020-09-04 DIAGNOSIS — I442 Atrioventricular block, complete: Secondary | ICD-10-CM | POA: Insufficient documentation

## 2020-09-04 DIAGNOSIS — I4821 Permanent atrial fibrillation: Secondary | ICD-10-CM | POA: Insufficient documentation

## 2020-09-04 DIAGNOSIS — Z888 Allergy status to other drugs, medicaments and biological substances status: Secondary | ICD-10-CM | POA: Insufficient documentation

## 2020-09-04 DIAGNOSIS — G4730 Sleep apnea, unspecified: Secondary | ICD-10-CM | POA: Insufficient documentation

## 2020-09-04 DIAGNOSIS — D649 Anemia, unspecified: Secondary | ICD-10-CM | POA: Insufficient documentation

## 2020-09-04 DIAGNOSIS — Z86010 Personal history of colonic polyps: Secondary | ICD-10-CM | POA: Insufficient documentation

## 2020-09-04 DIAGNOSIS — Z7901 Long term (current) use of anticoagulants: Secondary | ICD-10-CM | POA: Insufficient documentation

## 2020-09-04 DIAGNOSIS — Z1211 Encounter for screening for malignant neoplasm of colon: Secondary | ICD-10-CM

## 2020-09-04 DIAGNOSIS — Z833 Family history of diabetes mellitus: Secondary | ICD-10-CM | POA: Insufficient documentation

## 2020-09-04 DIAGNOSIS — Z860101 Personal history of adenomatous and serrated colon polyps: Secondary | ICD-10-CM

## 2020-09-04 DIAGNOSIS — Z823 Family history of stroke: Secondary | ICD-10-CM | POA: Insufficient documentation

## 2020-09-04 DIAGNOSIS — E785 Hyperlipidemia, unspecified: Secondary | ICD-10-CM | POA: Insufficient documentation

## 2020-09-04 DIAGNOSIS — K5520 Angiodysplasia of colon without hemorrhage: Secondary | ICD-10-CM

## 2020-09-04 HISTORY — PX: COLONOSCOPY WITH PROPOFOL: SHX5780

## 2020-09-04 HISTORY — PX: HOT HEMOSTASIS: SHX5433

## 2020-09-04 HISTORY — PX: POLYPECTOMY: SHX5525

## 2020-09-04 HISTORY — PX: BIOPSY: SHX5522

## 2020-09-04 HISTORY — PX: ESOPHAGOGASTRODUODENOSCOPY (EGD) WITH PROPOFOL: SHX5813

## 2020-09-04 SURGERY — COLONOSCOPY WITH PROPOFOL
Anesthesia: Monitor Anesthesia Care

## 2020-09-04 MED ORDER — PROPOFOL 500 MG/50ML IV EMUL
INTRAVENOUS | Status: DC | PRN
  Administered 2020-09-04: 120 ug/kg/min via INTRAVENOUS

## 2020-09-04 MED ORDER — LACTATED RINGERS IV SOLN: INTRAVENOUS | Status: DC | PRN

## 2020-09-04 MED ORDER — SODIUM CHLORIDE 0.9 % IV SOLN: INTRAVENOUS | Status: DC

## 2020-09-04 MED ORDER — PHENYLEPHRINE 40 MCG/ML (10ML) SYRINGE FOR IV PUSH (FOR BLOOD PRESSURE SUPPORT)
PREFILLED_SYRINGE | INTRAVENOUS | Status: DC | PRN
  Administered 2020-09-04 (×2): 80 ug via INTRAVENOUS
  Administered 2020-09-04: 120 ug via INTRAVENOUS
  Administered 2020-09-04: 80 ug via INTRAVENOUS
  Administered 2020-09-04: 160 ug via INTRAVENOUS
  Administered 2020-09-04 (×2): 120 ug via INTRAVENOUS

## 2020-09-04 MED ORDER — PROPOFOL 500 MG/50ML IV EMUL
INTRAVENOUS | Status: DC | PRN
  Administered 2020-09-04 (×2): 20 mg via INTRAVENOUS
  Administered 2020-09-04: 10 mg via INTRAVENOUS

## 2020-09-04 MED ORDER — LIDOCAINE HCL (CARDIAC) PF 100 MG/5ML IV SOSY
PREFILLED_SYRINGE | INTRAVENOUS | Status: DC | PRN
  Administered 2020-09-04: 50 mg via INTRATRACHEAL

## 2020-09-04 SURGICAL SUPPLY — 24 items

## 2020-09-04 NOTE — Op Note (Signed)
[facility] Patient Name: [name] Procedure Date: 09/04/2020 MRN: [id number] Attending MD: Mauri Pole , MD Date of Birth: [birth year] CSN: [id number] Age: 74 Admit Type: Outpatient Procedure:                Upper GI endoscopy Indications:              Suspected upper gastrointestinal bleeding in                            patient with unexplained iron deficiency anemia,                            Epigastric abdominal pain Providers:                Mauri Pole, MD, Doristine Johns, RN, Tyna Jaksch Technician, Dayle Points, CRNA Referring MD:              Medicines:                Monitored Anesthesia Care Complications:            No immediate complications. Estimated Blood Loss:     Estimated blood loss was minimal. Procedure:                Pre-Anesthesia Assessment:                           - Prior to the procedure, a History and Physical                            was performed, and patient medications and                            allergies were reviewed. The patient's tolerance of                            previous anesthesia was also reviewed. The risks                            and benefits of the procedure and the sedation                            options and risks were discussed with the patient.                            All questions were answered, and informed consent                            was obtained. Prior Anticoagulants: The patient                            last took Eliquis (apixaban) 2 days prior to the  procedure. ASA Grade Assessment: III - A patient                            with severe systemic disease. After reviewing the                            risks and benefits, the patient was deemed in                            satisfactory condition to undergo the procedure.                           After obtaining informed consent, the endoscope was                             passed under direct vision. Throughout the                            procedure, the patient's blood pressure, pulse, and                            oxygen saturations were monitored continuously. The                            GIF-H190 ([id number]) Olympus gastroscope was                            introduced through the mouth, and advanced to the                            second part of duodenum. The upper GI endoscopy was                            accomplished without difficulty. The patient                            tolerated the procedure well. Scope In: Scope Out: Findings:      The Z-line was regular and was found 35 cm from the incisors.      A few dispersed less than 1 mm erosions with no bleeding and no stigmata       of recent bleeding were found in the gastric antrum.      Two less than 1 mm angiodysplastic lesions with typical arborization       were found in the second portion of the duodenum. Coagulation for       hemostasis using argon plasma was successful.      The cardia and gastric fundus were normal on retroflexion.      The exam was otherwise without abnormality. Impression:               - Z-line regular, 35 cm from the incisors.                           - Erosive gastropathy with no bleeding and no  stigmata of recent bleeding.                           - Two angiodysplastic lesions in the duodenum.                            Treated with argon plasma coagulation (APC).                           - The examination was otherwise normal.                           - No specimens collected. Moderate Sedation:      Not Applicable - Patient had care per Anesthesia. Recommendation:           - Patient has a contact number available for                            emergencies. The signs and symptoms of potential                            delayed complications were discussed with the                            patient.  Return to normal activities tomorrow.                            Written discharge instructions were provided to the                            patient.                           - Resume previous diet.                           - Continue present medications.                           - See the other procedure note for documentation of                            additional recommendations. Procedure Code(s):        --- Professional ---                           [phone], Esophagogastroduodenoscopy, flexible,                            transoral; with control of bleeding, any method Diagnosis Code(s):        --- Professional ---                           K31.89, Other diseases of stomach and duodenum                           K31.819,  Angiodysplasia of stomach and duodenum                            without bleeding                           D50.9, Iron deficiency anemia, unspecified                           R10.13, Epigastric pain CPT copyright 2019 [employer]. All rights reserved. The codes documented in this report are preliminary and upon coder review may  be revised to meet current compliance requirements. Mauri Pole, MD 09/04/2020 10:35:52 AM This report has been signed electronically. Number of Addenda: 0

## 2020-09-04 NOTE — Op Note (Signed)
[facility] Patient Name: [name] Procedure Date: 09/04/2020 MRN: [id number] Attending MD: Mauri Pole , MD Date of Birth: Mar 15, 1946 CSN: [id number] Age: 74 Admit Type: Outpatient Procedure:                Colonoscopy Indications:              Unexplained iron deficiency anemia Providers:                Mauri Pole, MD, Doristine Johns, RN, Tyna Jaksch Technician, Dayle Points, CRNA Referring MD:              Medicines:                Monitored Anesthesia Care Complications:            No immediate complications. Estimated Blood Loss:     Estimated blood loss was minimal. Procedure:                Pre-Anesthesia Assessment:                           - Prior to the procedure, a History and Physical                            was performed, and patient medications and                            allergies were reviewed. The patient's tolerance of                            previous anesthesia was also reviewed. The risks                            and benefits of the procedure and the sedation                            options and risks were discussed with the patient.                            All questions were answered, and informed consent                            was obtained. Prior Anticoagulants: The patient                            last took Eliquis (apixaban) 2 days prior to the                            procedure. ASA Grade Assessment: III - A patient                            with severe systemic disease. After reviewing the  risks and benefits, the patient was deemed in                            satisfactory condition to undergo the procedure.                           After obtaining informed consent, the colonoscope                            was passed under direct vision. Throughout the                            procedure, the patient's blood pressure, pulse,  and                            oxygen saturations were monitored continuously. The                            PCF-[id number] ([id number]) Olympus pediatric colonscope                            was introduced through the anus and advanced to the                            the cecum, identified by appendiceal orifice and                            ileocecal valve. The colonoscopy was performed                            without difficulty. The patient tolerated the                            procedure well. The quality of the bowel                            preparation was good. The ileocecal valve,                            appendiceal orifice, and rectum were photographed. Scope In: 10:05:04 AM Scope Out: 10:26:03 AM Scope Withdrawal Time: 0 hours 15 minutes 7 seconds  Total Procedure Duration: 0 hours 20 minutes 59 seconds  Findings:      The perianal and digital rectal examinations were normal.      Five sessile polyps were found in the transverse colon. The polyps were       3 to 5 mm in size. These polyps were removed with a cold snare.       Resection and retrieval were complete.      Three sessile polyps were found in the sigmoid colon and descending       colon. The polyps were 1 to 2 mm in size. These polyps were removed with       a cold biopsy forceps. Resection and retrieval were complete.      A diffuse area of moderate melanosis was found in the  entire colon.      Non-bleeding internal hemorrhoids were found during retroflexion. The       hemorrhoids were medium-sized.      The exam was otherwise without abnormality. Impression:               - Five 3 to 5 mm polyps in the transverse colon,                            removed with a cold snare. Resected and retrieved.                           - Three 1 to 2 mm polyps in the sigmoid colon and                            in the descending colon, removed with a cold biopsy                            forceps. Resected and  retrieved.                           - Melanosis in the colon.                           - Non-bleeding internal hemorrhoids.                           - The examination was otherwise normal. Moderate Sedation:      Not Applicable - Patient had care per Anesthesia. Recommendation:           - Patient has a contact number available for                            emergencies. The signs and symptoms of potential                            delayed complications were discussed with the                            patient. Return to normal activities tomorrow.                            Written discharge instructions were provided to the                            patient.                           - Resume previous diet.                           - Continue present medications.                           - Resume Eliquis (apixaban) at prior dose in 2  days. Refer to managing physician for further                            adjustment of therapy.                           - Await pathology results.                           - Repeat colonoscopy in 3 - 5 years for                            surveillance based on pathology results.                           - Return to GI clinic in 3 months. Procedure Code(s):        --- Professional ---                           [phone], Colonoscopy, flexible; with removal of                            tumor(s), polyp(s), or other lesion(s) by snare                            technique                           45380, 88, Colonoscopy, flexible; with biopsy,                            single or multiple Diagnosis Code(s):        --- Professional ---                           K63.5, Polyp of colon                           K63.89, Other specified diseases of intestine                           K64.8, Other hemorrhoids                           D50.9, Iron deficiency anemia, unspecified CPT copyright 2019 American Medical Association. All  rights reserved. The codes documented in this report are preliminary and upon coder review may  be revised to meet current compliance requirements. Mauri Pole, MD 09/04/2020 10:41:30 AM This report has been signed electronically. Number of Addenda: 0

## 2020-09-04 NOTE — H&P (Signed)
[facility] History and Physical   Primary Care Physician:  [facility]   Reason for Procedure:  Iron deficiency anemia, GERD, abdominal pain  Plan:    EGD and colonoscopy     HPI: [name] is a 74 y.o. female on chronic anticoagulation here for evaluation of abdominal pain, iron deficiency anemia.   Past Medical History:  Diagnosis Date  . Anemia   . Angioedema    2/2 ACE  . Arteriovenous malformation of stomach   . Arthritis   . Asthma   . AVM (arteriovenous malformation) of colon    small intestine; stomach  . Blood transfusion   . CHF (congestive heart failure) ([place])   . Complete heart block ([place])    s/p PPM 1998  . Complication of anesthesia    Difficult airway; anaphylaxis and swelling with propofol  . DDD (degenerative disc disease)   . Depression   . Diastolic heart failure   . Fatty liver 07/26/10  . GERD (gastroesophageal reflux disease)   . GI bleed   . History of alcohol abuse Stopped Fall 2012  . History of tobacco use Quit Fall 2012  . Hx of cardiovascular stress test    a. Lexiscan Myoview (10/15):  Small inferolateral and apical defect c/w scar and poss soft tissue attenuation, no ischemia, EF 42%  . Hx of colonic polyp 08/13/10   adenomatous  . Hx of colonoscopy   . Hyperlipidemia   . Hypertension   . Hypertrophic cardiomyopathy ([place])    dx by Dr [name] 2009  . Iron deficiency anemia   . Myocardial infarction ([place])   . Pacemaker   . Panic attacks   . Permanent atrial fibrillation ([place])   . Renal failure    baseline creatinine 1.6  . Right arm pain 01/08/2012  . RLS (restless legs syndrome)    Dx 06/2007  . Sleep apnea    no cpap     Past Surgical History:  Procedure Laterality Date  . CARDIAC CATHETERIZATION    . CARDIAC CATHETERIZATION N/A 08/19/2016   Procedure: Right/Left Heart Cath and Coronary Angiography;  Surgeon: Jolaine Artist, MD;  Location: [facility];   Service: Cardiovascular;  Laterality: N/A;  . COLONOSCOPY WITH PROPOFOL N/A 06/16/2017   Procedure: COLONOSCOPY WITH PROPOFOL;  Surgeon: Mauri Pole, MD;  Location: [facility];  Service: Endoscopy;  Laterality: N/A;  . DIRECT LARYNGOSCOPY N/A 09/18/2016   Procedure: DIRECT LARYNGOSCOPY, BRONCHOSCOPY, REMOVAL OF INTUBATION GRANULOMA;  Surgeon: [name], MD;  Location: [facility] OR;  Service: ENT;  Laterality: N/A;  . DIRECT LARYNGOSCOPY N/A 10/20/2016   Procedure: EXTUBATION AND FLEXIBLE LARYNGOSCOPE;  Surgeon: [name], MD;  Location: [place];  Service: ENT;  Laterality: N/A;  . DIRECT LARYNGOSCOPY N/A 10/29/2016   Procedure: DIRECT LARYNGOSCOPY;  Surgeon: [name], MD;  Location: [facility] OR;  Service: ENT;  Laterality: N/A;  . ESOPHAGOGASTRODUODENOSCOPY  12/23/2011   Procedure: ESOPHAGOGASTRODUODENOSCOPY (EGD);  Surgeon: [name], MD;  Location: [facility];  Service: Endoscopy;  Laterality: N/A;  . ESOPHAGOGASTRODUODENOSCOPY (EGD) WITH PROPOFOL N/A 06/16/2017   Procedure: ESOPHAGOGASTRODUODENOSCOPY (EGD) WITH PROPOFOL;  Surgeon: Mauri Pole, MD;  Location: [facility];  Service: Endoscopy;  Laterality: N/A;  . EXTUBATION (ENDOTRACHEAL) IN OR N/A 07/21/2016   Procedure: EXTUBATION (ENDOTRACHEAL) IN OR;  Surgeon: [name], MD;  Location: [place];  Service: ENT;  Laterality: N/A;  . GIVENS CAPSULE STUDY  12/23/2011   Procedure: GIVENS CAPSULE STUDY;  Surgeon: [name], MD;  Location: [facility];  Service: Endoscopy;  Laterality: N/A;  . MICROLARYNGOSCOPY WITH LASER N/A 10/16/2016   Procedure: MICRODIRECTLARYNGOSCOPY WITH LASER ABLATION AND KENLOG INJECTION;  Surgeon: [name], MD;  Location: [place];  Service: ENT;  Laterality: N/A;  . PACEMAKER INSERTION  1998   [facility] jud[facility], most recent gen change by [name] 4/12  . TRACHEOSTOMY TUBE PLACEMENT N/A 10/29/2016   Procedure: TRACHEOSTOMY;  Surgeon: [name], MD;  Location: [place];  Service: ENT;  Laterality: N/A;  . TUBAL  LIGATION  04/01/2000    Prior to Admission medications   Medication Sig Start Date End Date Taking? Authorizing Provider  acetaminophen (TYLENOL) 650 MG CR tablet Take 650 mg by mouth every 8 (eight) hours as needed for pain.    Yes [provider]  allopurinol (ZYLOPRIM) 100 MG tablet TAKE 2 TABLETS(200 MG) BY MOUTH DAILY Patient taking differently: Take 200 mg by mouth daily.  05/18/19  Yes Riccio, Gardiner Rhyme, DO  ALPRAZolam (XANAX) 0.5 MG tablet Take 1 tablet (0.5 mg total) by mouth at bedtime as needed for anxiety. 08/15/19  Yes Enid Derry, [name], DO  apixaban (ELIQUIS) 5 MG TABS tablet Take 1 tablet (5 mg total) by mouth 2 (two) times daily. 06/06/19  Yes Skeet Latch, MD  brimonidine (ALPHAGAN) 0.2 % ophthalmic solution Place 1 drop into the right eye 2 (two) times daily. 08/22/20  Yes [provider]  Cholecalciferol (VITAMIN D) 50 MCG (2000 UT) tablet Take 2,000 Units by mouth daily.   Yes [provider]  Epoetin Alfa-epbx (RETACRIT IJ) Inject 1 Dose as directed once a week.    Yes [provider]  ferrous sulfate 325 (65 FE) MG tablet TAKE 1 TABLET BY MOUTH TWICE DAILY WITH MEALS TO KEEP BLOOD COUNT UP Patient taking differently: Take 325 mg by mouth 2 (two) times daily with a meal.  08/26/16  Yes Riccio, Angela C, DO  furosemide (LASIX) 20 MG tablet Take 1 tablet (20 mg total) by mouth daily. Daily as needed for weight gain of 2lbs in a day or 5lbs in a week Patient taking differently: Take 20 mg by mouth daily.  12/14/19  Yes Skeet Latch, MD  ketorolac (ACULAR) 0.5 % ophthalmic solution Place 1 drop into the left eye 4 (four) times daily. Until 9/28 08/22/20  Yes [provider]  latanoprost (XALATAN) 0.005 % ophthalmic solution Place 1 drop into both eyes at bedtime. 11/24/19  Yes [provider]  metoprolol tartrate (LOPRESSOR) 25 MG tablet Take 1 tablet (25 mg total) by mouth 2 (two) times daily. 09/30/19  Yes Skeet Latch, MD   mirtazapine (REMERON) 7.5 MG tablet Take 7.5 mg by mouth at bedtime.   Yes [provider]  ofloxacin (OCUFLOX) 0.3 % ophthalmic solution Place 1 drop into the right eye 4 (four) times daily. 08/22/20  Yes [provider]  Olopatadine HCl 0.2 % SOLN Place 1-2 drops into both eyes daily as needed (itching).  02/17/18  Yes [provider]  Polyethylene Glycol 3350 (MIRALAX PO) Take 118 g by mouth once. Part of colonoscopy 2 day prep   Yes [provider]  potassium chloride (K-DUR) 10 MEQ tablet Take 1 tablet (10 mEq total) by mouth daily. 08/19/19  Yes Enid Derry, [name], DO  prednisoLONE acetate (PRED FORTE) 1 % ophthalmic suspension Place 1 drop into the right eye 4 (four) times daily. 08/22/20  Yes [provider]  rosuvastatin (CRESTOR) 20 MG tablet Take 1 tablet (  20 mg total) by mouth at bedtime. 08/19/19  Yes Enid Derry, [name], DO  STIMULANT LAXATIVE 8.6-50 MG tablet Take 2 tablets by mouth at bedtime as needed for mild constipation.  11/22/19  Yes [provider]  triamcinolone cream (KENALOG) 0.1 % Apply 1 application topically 2 (two) times daily. Patient taking differently: Apply 1 application topically 2 (two) times daily as needed (lip rash).  06/03/18  Yes Hensel, Jamal Collin, MD  albuterol (PROVENTIL HFA;VENTOLIN HFA) 108 (90 Base) MCG/ACT inhaler Inhale 1-2 puffs into the lungs every 6 (six) hours as needed for wheezing or shortness of breath. 08/04/18   Riccio, Gardiner Rhyme, DO  cetirizine (ZYRTEC) 10 MG tablet Take 10 mg by mouth daily as needed for allergies.     [provider]  ipratropium-albuterol (DUONEB) 0.5-2.5 (3) MG/3ML SOLN USE 3 ML VIA NEBULIZER EVERY 6 HOURS Patient taking differently: Inhale 3 mLs into the lungs every 6 (six) hours as needed (shortness of breath).  10/21/18   Steve Rattler, DO  loratadine (CLARITIN) 10 MG tablet Take 1 tablet (10 mg total) by mouth daily. One po daily x 5 days Patient not taking: Reported on  08/28/2020 12/13/19   Charlesetta Shanks, MD  nitroGLYCERIN (NITROSTAT) 0.4 MG SL tablet Place 1 tablet (0.4 mg total) under the tongue every 5 (five) minutes as needed for chest pain. 08/04/18   Steve Rattler, DO  oxymetazoline (AFRIN NASAL SPRAY) 0.05 % nasal spray Place 1 spray into both nostrils 2 (two) times daily. Patient taking differently: Place 1 spray into both nostrils 2 (two) times daily as needed for congestion.  05/21/19   Hall-Potvin, [place], PA-C  sodium chloride (OCEAN) 0.65 % SOLN nasal spray Place 1 spray into both nostrils daily as needed for congestion.     [provider]    Current Facility-Administered Medications  Medication Dose Route Frequency Provider Last Rate Last Admin  . 0.9 %  sodium chloride infusion   Intravenous Continuous Ilea Hilton V, MD      . 0.9 %  sodium chloride infusion   Intravenous Continuous Juna Caban, Venia Minks, MD        Allergies as of 06/20/2020 - Review Complete 06/19/2020  Allergen Reaction Noted  . Ace inhibitors Swelling 03/15/2008  . Other Anaphylaxis and Swelling 10/15/2016    Family History  Problem Relation Age of Onset  . Hypertension Mother   . Stroke Mother   . Heart disease Mother   . Aneurysm Mother   . CVA Mother   . Hypertension Father   . Stroke Father   . Heart attack Father   . Alcohol abuse Father   . Hypertension Sister   . Hypertension Brother   . Colon cancer Brother   . Cancer Brother   . Diabetes Sister   . Diabetes Brother   . Heart attack Brother   . Heart attack Brother   . Heart disease Sister   . Alcohol abuse Brother   . Heart disease Daughter   . Esophageal cancer Neg Hx   . Stomach cancer Neg Hx   . Rectal cancer Neg Hx     Social History   Socioeconomic History  . Marital status: Divorced    Spouse name: Not on file  . Number of children: 4  . Years of education: 9  . Highest education level: Not on file  Occupational History  . Occupation: disabled    Fish farm manager:  UNEMPLOYED  . Occupation: previously- Development worker, community  Tobacco Use  .  Smoking status: Former Smoker    Packs/day: 0.10    Years: 50.00    Pack years: 5.00    Types: Cigarettes    Quit date: 08/16/2011    Years since quitting: 9.0  . Smokeless tobacco: Never Used  . Tobacco comment: quit 2012  Vaping Use  . Vaping Use: Never used  Substance and Sexual Activity  . Alcohol use: No    Comment: quit 08/16/2011  . Drug use: No  . Sexual activity: Never  Other Topics Concern  . Not on file  Social History Narrative   On disability for heart failure/pacemaker.     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    4 grown children,  8 grandchildren.     Drinks alcohol a few times a week socially. At one time, the most she reports drinking is about 3 mixed drinks.   Lives with daughter [name] and son. Will be moving 04/2011 to different house because concerned children are taking advantage of her financial situation.    No illicit drugs.   Smokes few cigarettes daily, especially when stressed. Started smoking @ age 46. Quit January 2012 2/2 health but started smoking again 02/2011.       Health Care POA:    Emergency Contact: daughter, Verdene Lennert [phone]   End of Life Plan: gave pt AD info pamphlet   Who lives with you: no one- lives in section 8 housing   Any pets: none   Diet: Pt has a varied diet of protein, starch and vegetables   Exercise: Pt has no regular exercise routine.   Seatbelts: Pt reports wearing seatbelt when in vehicles.    Hobbies: dancing   Social Determinants of Health   Financial Resource Strain:   . Difficulty of Paying Living Expenses: Not on file  Food Insecurity:   . Worried About Charity fundraiser in the Last Year: Not on file  . Ran Out of Food in the Last Year: Not on file  Transportation Needs:   . Lack of Transportation (Medical): Not on file  . Lack of Transportation (Non-Medical): Not on file  Physical Activity:   . Days of Exercise per  Week: Not on file  . Minutes of Exercise per Session: Not on file  Stress:   . Feeling of Stress : Not on file  Social Connections:   . Frequency of Communication with Friends and Family: Not on file  . Frequency of Social Gatherings with Friends and Family: Not on file  . Attends Religious Services: Not on file  . Active Member of Clubs or Organizations: Not on file  . Attends Archivist Meetings: Not on file  . Marital Status: Not on file  Intimate Partner Violence:   . Fear of Current or Ex-Partner: Not on file  . Emotionally Abused: Not on file  . Physically Abused: Not on file  . Sexually Abused: Not on file    Review of Systems:  All other review of systems negative except as mentioned in the HPI.  Physical Exam: Vital signs in last 24 hours: Temp:  [98.2 F (36.8 C)] 98.2 F (36.8 C) (09/21 0831) Pulse Rate:  [70] 70 (09/21 0831) Resp:  [16] 16 (09/21 0831) BP: (130)/(70) 130/70 (09/21 0831) SpO2:  [100 %] 100 % (09/21 0831) Weight:  [57.2 kg] 57.2 kg (09/21 0831)   General:   Alert and cooperative in NAD Lungs:  Trach collar, Clear throughout to auscultation.   Heart:  Regular  rate and rhythm; no murmurs, clicks, rubs,  or gallops. Abdomen:  Soft, nontender and nondistended. Normal bowel sounds.   Neuro/Psych:  Alert and cooperative. Normal mood and affect. A and O x 3   K. Denzil Magnuson , MD [phone]

## 2020-09-04 NOTE — Anesthesia Preprocedure Evaluation (Addendum)
Anesthesia Evaluation  Patient identified by MRN, date of birth, ID band Patient awake    Reviewed: Allergy & Precautions, NPO status , Patient's Chart, lab work & pertinent test results  Airway Mallampati: Trach   Neck ROM: Full    Dental  (+) Dental Advisory Given   Pulmonary shortness of breath, with exertion, at rest and lying, asthma , sleep apnea , pneumonia, resolved, COPD,  COPD inhaler, former smoker,    Pulmonary exam normal breath sounds clear to auscultation       Cardiovascular hypertension, Pt. on medications and Pt. on home beta blockers + angina with exertion + Past MI and +CHF  Normal cardiovascular exam+ dysrhythmias Atrial Fibrillation + pacemaker  Rhythm:Regular Rate:Normal  Echo 04/2019 1. The left ventricle has hyperdynamic systolic function, with an ejection fraction of >65%. The cavity size was normal. There is severely increased left ventricular wall thickness. Findings are consistent with hypertrophic cardiomyopathy. Left ventricular diastolic Doppler parameters are consistent with pseudonormalization.  2. The right ventricle has normal systolic function. The cavity was normal. There is no increase in right ventricular wall thickness.  3. Left atrial size was moderately dilated.  4. Right atrial size was moderately dilated.  5. Mitral valve regurgitation is moderate by color flow Doppler.  6. The aortic valve is tricuspid. Mild thickening of the aortic valve. Mild calcification of the aortic valve.    Neuro/Psych  Headaches, PSYCHIATRIC DISORDERS Anxiety Depression Restless legs syndrome CVA    GI/Hepatic GERD  Medicated and Controlled,(+)     substance abuse  alcohol use, Hx/o ETOH abuse- quit 2012   Endo/Other  Hyperlipidemia  Renal/GU Renal InsufficiencyRenal disease     Musculoskeletal  (+) Arthritis , Osteoarthritis,  DDD Lumbar spine Polymyalgia rheumatica   Abdominal   Peds   Hematology  (+) anemia , Hx/o Angioedema   Anesthesia Other Findings   Reproductive/Obstetrics                            Anesthesia Physical  Anesthesia Plan  ASA: IV  Anesthesia Plan: MAC   Post-op Pain Management:    Induction: Intravenous  PONV Risk Score and Plan: 2 and Propofol, Midazolam, Propofol infusion, TIVA and Treatment may vary due to age or medical condition  Airway Management Planned: Natural Airway and Nasal Cannula  Additional Equipment:   Intra-op Plan:   Post-operative Plan:   Informed Consent: I have reviewed the patients History and Physical, chart, labs and discussed the procedure including the risks, benefits and alternatives for the proposed anesthesia with the patient or authorized representative who has indicated his/her understanding and acceptance.     Dental advisory given  Plan Discussed with: CRNA  Anesthesia Plan Comments:        Anesthesia Quick Evaluation

## 2020-09-04 NOTE — Transfer of Care (Signed)
Immediate Anesthesia Transfer of Care Note  Patient: [name]  Procedure(s) Performed: COLONOSCOPY WITH PROPOFOL (N/A ) ESOPHAGOGASTRODUODENOSCOPY (EGD) WITH PROPOFOL (N/A ) HOT HEMOSTASIS (ARGON PLASMA COAGULATION/BICAP) (N/A ) POLYPECTOMY  Patient Location: Endoscopy Unit  Anesthesia Type:MAC  Level of Consciousness: drowsy, patient cooperative and responds to stimulation  Airway & Oxygen Therapy: Patient Spontanous Breathing and Patient connected to tracheostomy mask oxygen  Post-op Assessment: Report given to RN and Post -op Vital signs reviewed and stable  Post vital signs: Reviewed and stable  Last Vitals:  Vitals Value Taken Time  BP    Temp    Pulse    Resp    SpO2      Last Pain:  Vitals:   09/04/20 0831  TempSrc: Oral  PainSc: 0-No pain         Complications: No complications documented.

## 2020-09-04 NOTE — Discharge Instructions (Signed)
YOU HAD AN ENDOSCOPIC PROCEDURE TODAY: Refer to the procedure report and other information in the discharge instructions given to you for any specific questions about what was found during the examination. If this information does not answer your questions, please call [place] office at [phone] to clarify.  ° °YOU SHOULD EXPECT: Some feelings of bloating in the abdomen. Passage of more gas than usual. Walking can help get rid of the air that was put into your GI tract during the procedure and reduce the bloating. If you had a lower endoscopy (such as a colonoscopy or flexible sigmoidoscopy) you may notice spotting of blood in your stool or on the toilet paper. Some abdominal soreness may be present for a day or two, also. ° °DIET: Your first meal following the procedure should be a light meal and then it is ok to progress to your normal diet. A half-sandwich or bowl of soup is an example of a good first meal. Heavy or fried foods are harder to digest and may make you feel nauseous or bloated. Drink plenty of fluids but you should avoid alcoholic beverages for 24 hours. If you had a esophageal dilation, please see attached instructions for diet.   ° °ACTIVITY: Your care partner should take you home directly after the procedure. You should plan to take it easy, moving slowly for the rest of the day. You can resume normal activity the day after the procedure however YOU SHOULD NOT DRIVE, use power tools, machinery or perform tasks that involve climbing or major physical exertion for 24 hours (because of the sedation medicines used during the test).  ° °SYMPTOMS TO REPORT IMMEDIATELY: °A gastroenterologist can be reached at any hour. Please call [phone]  for any of the following symptoms:  °Following lower endoscopy (colonoscopy, flexible sigmoidoscopy) °Excessive amounts of blood in the stool  °Significant tenderness, worsening of abdominal pains  °Swelling of the abdomen that is new, acute  °Fever of 100° or  higher  °Following upper endoscopy (EGD, EUS, ERCP, esophageal dilation) °Vomiting of blood or coffee ground material  °New, significant abdominal pain  °New, significant chest pain or pain under the shoulder blades  °Painful or persistently difficult swallowing  °New shortness of breath  °Black, tarry-looking or red, bloody stools ° °FOLLOW UP:  °If any biopsies were taken you will be contacted by phone or by letter within the next 1-3 weeks. Call [phone]  if you have not heard about the biopsies in 3 weeks.  °Please also call with any specific questions about appointments or follow up tests. ° °

## 2020-09-05 ENCOUNTER — Encounter (HOSPITAL_COMMUNITY): Payer: Self-pay | Admitting: Gastroenterology

## 2020-09-05 LAB — SURGICAL PATHOLOGY

## 2020-09-06 ENCOUNTER — Other Ambulatory Visit (HOSPITAL_COMMUNITY): Payer: Self-pay | Admitting: *Deleted

## 2020-09-06 NOTE — Anesthesia Postprocedure Evaluation (Signed)
Anesthesia Post Note  Patient: [name]  Procedure(s) Performed: COLONOSCOPY WITH PROPOFOL (N/A ) ESOPHAGOGASTRODUODENOSCOPY (EGD) WITH PROPOFOL (N/A ) HOT HEMOSTASIS (ARGON PLASMA COAGULATION/BICAP) (N/A ) POLYPECTOMY BIOPSY     Patient location during evaluation: PACU Anesthesia Type: MAC Level of consciousness: awake and alert Pain management: pain level controlled Vital Signs Assessment: post-procedure vital signs reviewed and stable Respiratory status: spontaneous breathing Cardiovascular status: stable Anesthetic complications: no   No complications documented.  Last Vitals:  Vitals:   09/04/20 1035 09/04/20 1040  BP: 100/69   Pulse:    Resp:    Temp:  36.7 C  SpO2:      Last Pain:  Vitals:   09/05/20 1126  TempSrc:   PainSc: 0-No pain                 Nolon Nations

## 2020-09-07 ENCOUNTER — Ambulatory Visit: Payer: Medicare (Managed Care) | Admitting: Cardiovascular Disease

## 2020-09-07 ENCOUNTER — Ambulatory Visit (HOSPITAL_COMMUNITY)
Admission: RE | Admit: 2020-09-07 | Discharge: 2020-09-07 | Disposition: A | Discharge status: Home / Self Care | Payer: Medicare (Managed Care) | Source: Ambulatory Visit | Attending: Family Medicine | Admitting: Family Medicine

## 2020-09-07 ENCOUNTER — Other Ambulatory Visit: Payer: Self-pay

## 2020-09-07 DIAGNOSIS — D509 Iron deficiency anemia, unspecified: Secondary | ICD-10-CM | POA: Insufficient documentation

## 2020-09-07 DIAGNOSIS — D631 Anemia in chronic kidney disease: Secondary | ICD-10-CM | POA: Diagnosis not present

## 2020-09-07 DIAGNOSIS — N189 Chronic kidney disease, unspecified: Secondary | ICD-10-CM | POA: Diagnosis not present

## 2020-09-07 LAB — PREPARE RBC (CROSSMATCH)

## 2020-09-07 MED ORDER — SODIUM CHLORIDE 0.9% IV SOLUTION: Freq: Once | INTRAVENOUS | Status: DC

## 2020-09-08 LAB — TYPE AND SCREEN
ABO/RH(D): O POS
Antibody Screen: NEGATIVE
Unit division: 0

## 2020-09-08 LAB — BPAM RBC
Blood Product Expiration Date: 202110272359
ISSUE DATE / TIME: 202109240929
Unit Type and Rh: 5100

## 2020-09-11 ENCOUNTER — Encounter: Payer: Self-pay | Admitting: Gastroenterology

## 2020-09-24 ENCOUNTER — Encounter (INDEPENDENT_AMBULATORY_CARE_PROVIDER_SITE_OTHER): Payer: Medicare (Managed Care) | Admitting: Ophthalmology

## 2020-10-02 ENCOUNTER — Encounter (INDEPENDENT_AMBULATORY_CARE_PROVIDER_SITE_OTHER): Payer: Medicare (Managed Care) | Admitting: Ophthalmology

## 2020-10-04 ENCOUNTER — Encounter: Payer: Self-pay | Admitting: Cardiovascular Disease

## 2020-10-04 ENCOUNTER — Ambulatory Visit (INDEPENDENT_AMBULATORY_CARE_PROVIDER_SITE_OTHER): Payer: Medicare (Managed Care) | Admitting: Cardiovascular Disease

## 2020-10-04 ENCOUNTER — Other Ambulatory Visit: Payer: Self-pay

## 2020-10-04 VITALS — BP 110/70 | HR 70 | Ht 65.0 in | Wt 133.0 lb

## 2020-10-04 DIAGNOSIS — Z5181 Encounter for therapeutic drug level monitoring: Secondary | ICD-10-CM | POA: Diagnosis not present

## 2020-10-04 DIAGNOSIS — I10 Essential (primary) hypertension: Secondary | ICD-10-CM | POA: Diagnosis not present

## 2020-10-04 DIAGNOSIS — E7800 Pure hypercholesterolemia, unspecified: Secondary | ICD-10-CM | POA: Diagnosis not present

## 2020-10-04 NOTE — Progress Notes (Signed)
Cardiology Office Note  Date:  10/04/2020   ID:  [name], DOB 01-Jun-1946, MRN [id number]  PCP:  [facility] [place]  Cardiologist:   Skeet Latch, MD  Electrophysiologist: Thompson Grayer, MD   No chief complaint on file.   Patient ID: [name] is a 74 y.o. female with hypertrophic cardiomyopathy, hypertension, hyperlipidemia, CKD IV, chronic atrial fibrillation, (not anticoagulated due to GI bleed), chronic diastolic heart failure, complete heart block status post pacemaker (St. Jude), and COPD who presents for follow up.    History of Present Illness: [name] was first seen on 12/14/15 as a referral from Dr. Rayann Heman. She was referred to general cardiology to establish care. She had been admitted to the hospital earlier that month with a complaint of chest pain and was noted to be mildly volume overloaded. Her BNP was 436 and echo revealed normal systolic function with grade 2 diastolic dysfunction. She was diuresed with IV Lasix and developed acute renal failure. Troponin was mildly elevated to 0.19.  Lexiscan Cardiolite was negative for ischemia.  [name] continued to struggle with palpitations and dizziness.  She has been in chronic atrial fibrillation and has not been on anticoagulation due to a GI bleed in 2012. She has known AVMs. She was referred for consideration of a Watchman device 02/2950, which was complicated by acute respiratory distress during the TEE that required intubation.  She developed acute biventricular failure that eventually normalized.  She was discharged to a SNF and was then readmitted with volume overload.  She has struggled with laryngeal stenosis managed by her ENT in [facility]  She is not a candidate for removal of her trach due to residual stenosis.  She has also been admitted with stroke 04/2019 with residual expressive aphasia.  She was subsequently started on Eliquis despite her GI  bleed history.  The following month see noted blood in her stool but h/h was stable.  She was noted to have acute on chronic renal failure and was referred to nephrology.  She last saw [name] 05/2019 and was doing well from a cardiac standpoint.  She continues to be stable.  She struggles with getting her words out and is frustrated by this.  She has occasional pain in her L breast that is non-exertional.  She continues to have shortness of breath with minimal exertion such as walking to her mailbox.  She attends [facility] twice weekly and is trying to exercise to increase her endurance.  She remains frustrated that she cannot get her trach out.    [name] has been doing OK. Metoprolol was added due to HCM, palpitations, and dyspnea.  She is noticing fewer palpitations.  She saw ENT at [place] is thinking about haivng he trach removed.  She has not been getting gmuch exercise.  Her daugher notes that she does her own ADLs but doesn't exercise much outside of doing her routine activities.  She has no chest pain but her breathing is stable.  She has no LE edema, orthopnea or PND.  Her sister passed away and her ex-husband passed since her last appointment.  Past Medical History:  Diagnosis Date  . Anemia   . Angioedema    2/2 ACE  . Arteriovenous malformation of stomach   . Arthritis   . Asthma   . AVM (arteriovenous malformation) of colon    small intestine; stomach  . Blood transfusion   . CHF (congestive heart failure) ([place])   .  Complete heart block ([employer])    s/p PPM 1998  . Complication of anesthesia    Difficult airway; anaphylaxis and swelling with propofol  . DDD (degenerative disc disease)   . Depression   . Diastolic heart failure   . Fatty liver 07/26/10  . GERD (gastroesophageal reflux disease)   . GI bleed   . History of alcohol abuse Stopped Fall 2012  . History of tobacco use Quit Fall 2012  . Hx of cardiovascular stress test    a. Lexiscan Myoview (10/15):   Small inferolateral and apical defect c/w scar and poss soft tissue attenuation, no ischemia, EF 42%  . Hx of colonic polyp 08/13/10   adenomatous  . Hx of colonoscopy   . Hyperlipidemia   . Hypertension   . Hypertrophic cardiomyopathy ([place])    dx by Dr [name] 2009  . Iron deficiency anemia   . Myocardial infarction ([place])   . Pacemaker   . Panic attacks   . Permanent atrial fibrillation ([place])   . Renal failure    baseline creatinine 1.6  . Right arm pain 01/08/2012  . RLS (restless legs syndrome)    Dx 06/2007  . Sleep apnea    no cpap     Past Surgical History:  Procedure Laterality Date  . BIOPSY  09/04/2020   Procedure: BIOPSY;  Surgeon: Mauri Pole, MD;  Location: [facility];  Service: Endoscopy;;  . CARDIAC CATHETERIZATION    . CARDIAC CATHETERIZATION N/A 08/19/2016   Procedure: Right/Left Heart Cath and Coronary Angiography;  Surgeon: Jolaine Artist, MD;  Location: [place] CV LAB;  Service: Cardiovascular;  Laterality: N/A;  . COLONOSCOPY WITH PROPOFOL N/A 06/16/2017   Procedure: COLONOSCOPY WITH PROPOFOL;  Surgeon: Mauri Pole, MD;  Location: [facility];  Service: Endoscopy;  Laterality: N/A;  . COLONOSCOPY WITH PROPOFOL N/A 09/04/2020   Procedure: COLONOSCOPY WITH PROPOFOL;  Surgeon: Mauri Pole, MD;  Location: [facility];  Service: Endoscopy;  Laterality: N/A;  . DIRECT LARYNGOSCOPY N/A 09/18/2016   Procedure: DIRECT LARYNGOSCOPY, BRONCHOSCOPY, REMOVAL OF INTUBATION GRANULOMA;  Surgeon: [name], MD;  Location: [facility] OR;  Service: ENT;  Laterality: N/A;  . DIRECT LARYNGOSCOPY N/A 10/20/2016   Procedure: EXTUBATION AND FLEXIBLE LARYNGOSCOPE;  Surgeon: [name], MD;  Location: [place];  Service: ENT;  Laterality: N/A;  . DIRECT LARYNGOSCOPY N/A 10/29/2016   Procedure: DIRECT LARYNGOSCOPY;  Surgeon: [name], MD;  Location: [facility] OR;  Service: ENT;  Laterality: N/A;  . ESOPHAGOGASTRODUODENOSCOPY  12/23/2011   Procedure:  ESOPHAGOGASTRODUODENOSCOPY (EGD);  Surgeon: [name], MD;  Location: [facility];  Service: Endoscopy;  Laterality: N/A;  . ESOPHAGOGASTRODUODENOSCOPY (EGD) WITH PROPOFOL N/A 06/16/2017   Procedure: ESOPHAGOGASTRODUODENOSCOPY (EGD) WITH PROPOFOL;  Surgeon: Mauri Pole, MD;  [facility];  Service: Endoscopy;  Laterality: N/A;  . ESOPHAGOGASTRODUODENOSCOPY (EGD) WITH PROPOFOL N/A 09/04/2020   Procedure: ESOPHAGOGASTRODUODENOSCOPY (EGD) WITH PROPOFOL;  Surgeon: Mauri Pole, MD;  [facility];  Service: Endoscopy;  Laterality: N/A;  . EXTUBATION (ENDOTRACHEAL) IN OR N/A 07/21/2016   Procedure: EXTUBATION (ENDOTRACHEAL) IN OR;  Surgeon: [name], MD;  Location: [place];  Service: ENT;  Laterality: N/A;  . GIVENS CAPSULE STUDY  12/23/2011   Procedure: GIVENS CAPSULE STUDY;  Surgeon: [name], MD;  [facility]: [facility];  Service: Endoscopy;  Laterality: N/A;  . HOT HEMOSTASIS N/A 09/04/2020   Procedure: HOT HEMOSTASIS (ARGON PLASMA COAGULATION/BICAP);  Surgeon: Mauri Pole, MD;  Location: [facility];  Service: Endoscopy;  Laterality: N/A;  . MICROLARYNGOSCOPY WITH LASER N/A 10/16/2016   Procedure: MICRODIRECTLARYNGOSCOPY WITH LASER ABLATION AND KENLOG INJECTION;  Surgeon: [name], MD;  Location: [place];  Service: ENT;  Laterality: N/A;  . PACEMAKER INSERTION  1998   st jude, most recent gen change by [name] 4/12  . POLYPECTOMY  09/04/2020   Procedure: POLYPECTOMY;  Surgeon: Mauri Pole, MD;  Location: [facility];  Service: Endoscopy;;  . TRACHEOSTOMY TUBE PLACEMENT N/A 10/29/2016   Procedure: TRACHEOSTOMY;  Surgeon: [name], MD;  Location: [place];  Service: ENT;  Laterality: N/A;  . TUBAL LIGATION  04/01/2000     Current Outpatient Medications  Medication Sig Dispense Refill  . acetaminophen (TYLENOL) 650 MG CR tablet Take 650 mg by mouth every 8 (eight) hours as needed for pain.     [name] albuterol (PROVENTIL HFA;VENTOLIN HFA) 108  (90 Base) MCG/ACT inhaler Inhale 1-2 puffs into the lungs every 6 (six) hours as needed for wheezing or shortness of breath. 1 Inhaler 6  . allopurinol (ZYLOPRIM) 100 MG tablet TAKE 2 TABLETS(200 MG) BY MOUTH DAILY (Patient taking differently: Take 200 mg by mouth daily. ) 180 tablet 3  . ALPRAZolam (XANAX) 0.5 MG tablet Take 1 tablet (0.5 mg total) by mouth at bedtime as needed for anxiety. 30 tablet 0  . apixaban (ELIQUIS) 5 MG TABS tablet Take 1 tablet (5 mg total) by mouth 2 (two) times daily. 180 tablet 1  . brimonidine (ALPHAGAN) 0.2 % ophthalmic solution Place 1 drop into the right eye 2 (two) times daily.    . cetirizine (ZYRTEC) 10 MG tablet Take 10 mg by mouth daily as needed for allergies.     . Cholecalciferol (VITAMIN D) 50 MCG (2000 UT) tablet Take 2,000 Units by mouth daily.    [name] Epoetin Alfa-epbx (RETACRIT IJ) Inject 1 Dose as directed once a week.     . ferrous sulfate 325 (65 FE) MG tablet TAKE 1 TABLET BY MOUTH TWICE DAILY WITH MEALS TO KEEP BLOOD COUNT UP (Patient taking differently: Take 325 mg by mouth 2 (two) times daily with a meal. ) 60 tablet 11  . furosemide (LASIX) 20 MG tablet Take 1 tablet (20 mg total) by mouth daily. Daily as needed for weight gain of 2lbs in a day or 5lbs in a week (Patient taking differently: Take 20 mg by mouth daily. ) 30 tablet 6  . ipratropium-albuterol (DUONEB) 0.5-2.5 (3) MG/3ML SOLN USE 3 ML VIA NEBULIZER EVERY 6 HOURS (Patient taking differently: Inhale 3 mLs into the lungs every 6 (six) hours as needed (shortness of breath). ) 360 mL 0  . ketorolac (ACULAR) 0.5 % ophthalmic solution Place 1 drop into the left eye 4 (four) times daily. Until 9/28    . latanoprost (XALATAN) 0.005 % ophthalmic solution Place 1 drop into both eyes at bedtime.    . metoprolol tartrate (LOPRESSOR) 25 MG tablet Take 1 tablet (25 mg total) by mouth 2 (two) times daily. 60 tablet 5  . mirtazapine (REMERON) 7.5 MG tablet Take 7.5 mg by mouth at bedtime.    .  nitroGLYCERIN (NITROSTAT) 0.4 MG SL tablet Place 1 tablet (0.4 mg total) under the tongue every 5 (five) minutes as needed for chest pain. 25 tablet 1  . ofloxacin (OCUFLOX) 0.3 % ophthalmic solution Place 1 drop into the right eye 4 (four) times daily.    . Olopatadine HCl 0.2 % SOLN Place 1-2 drops into both eyes daily as needed (itching).   1  .  oxymetazoline (AFRIN NASAL SPRAY) 0.05 % nasal spray Place 1 spray into both nostrils 2 (two) times daily. (Patient taking differently: Place 1 spray into both nostrils 2 (two) times daily as needed for congestion. ) 30 mL 0  . potassium chloride (K-DUR) 10 MEQ tablet Take 1 tablet (10 mEq total) by mouth daily. 90 tablet 0  . prednisoLONE acetate (PRED FORTE) 1 % ophthalmic suspension Place 1 drop into the right eye 4 (four) times daily.    . rosuvastatin (CRESTOR) 20 MG tablet Take 1 tablet (20 mg total) by mouth at bedtime. 90 tablet 3  . sodium chloride (OCEAN) 0.65 % SOLN nasal spray Place 1 spray into both nostrils daily as needed for congestion.     [name] STIMULANT LAXATIVE 8.6-50 MG tablet Take 2 tablets by mouth at bedtime as needed for mild constipation.     . triamcinolone cream (KENALOG) 0.1 % Apply 1 application topically 2 (two) times daily. (Patient taking differently: Apply 1 application topically 2 (two) times daily as needed (lip rash). ) 30 g 2   No current facility-administered medications for this visit.    Allergies:   Ace inhibitors, Other, and Propofol    Social History:  The patient  reports that she quit smoking about 9 years ago. Her smoking use included cigarettes. She has a 5.00 pack-year smoking history. She has never used smokeless tobacco. She reports that she does not drink alcohol and does not use drugs.   Family History:  The patient's family history includes Alcohol abuse in her brother and father; Aneurysm in her mother; CVA in her mother; Cancer in her brother; Colon cancer in her brother; Diabetes in her brother and  sister; Heart attack in her brother, brother, and father; Heart disease in her daughter, mother, and sister; Hypertension in her brother, father, mother, and sister; Stroke in her father and mother.    ROS:  Please see the history of present illness.   Otherwise, review of systems are positive for constipation.   All other systems are reviewed and negative.    PHYSICAL EXAM: VS:  BP 110/70   Pulse 70   Ht 5\' 5"  (1.651 m)   Wt 133 lb (60.3 kg)   SpO2 96%   BMI 22.13 kg/m  , BMI Body mass index is 22.13 kg/m. GENERAL:  Chronically ill-appearing HEENT:  Pupils equal round and reactive, fundi not visualized, oral mucosa unremarkable.   NECK:  No jugular venous distention, waveform within normal limits, carotid upstroke brisk and symmetric, no bruits.  Trach in place. LUNGS:  Clear to auscultation bilaterally HEART:  RRR.  PMI not displaced or sustained,S1 and S2 within normal limits, no S3, no S4, no clicks, no rubs, II/VI systolic murmur at LLSB CHEST: L chest wall tender to palpation ABD:  Flat, positive bowel sounds normal in frequency in pitch, no bruits, no rebound, no guarding, no midline pulsatile mass, no hepatomegaly, no splenomegaly EXT:  2 plus pulses throughout, no edema, no cyanosis no clubbing SKIN:  No rashes no nodules NEURO:  Cranial nerves II through XII grossly intact, motor grossly intact throughout PSYCH:  Cognitively intact, oriented to person place and time  EKG:  EKG is not ordered today. The ekg ordered 01/25/16 demonstrates ventricularly paced at 70 bpm. Likely atrial fibrillation.  Echo 05/09/19:   1. The left ventricle has hyperdynamic systolic function, with an ejection fraction of >65%. The cavity size was normal. There is severely increased left ventricular wall thickness. Findings are consistent  with hypertrophic cardiomyopathy. Left  ventricular diastolic Doppler parameters are consistent with pseudonormalization.  2. The right ventricle has normal  systolic function. The cavity was normal. There is no increase in right ventricular wall thickness.  3. Left atrial size was moderately dilated.  4. Right atrial size was moderately dilated.  5. Mitral valve regurgitation is moderate by color flow Doppler.  6. The aortic valve is tricuspid. Mild thickening of the aortic valve. Mild calcification of the aortic valve.  Echo 11/26/15: Study Conclusions  - Left ventricle: The cavity size was normal. There was severe concentric hypertrophy. Systolic function was vigorous. The estimated ejection fraction was in the range of 65% to 70%. Wall motion was normal; there were no regional wall motion abnormalities. Features are consistent with a pseudonormal left ventricular filling pattern, with concomitant abnormal relaxation and increased filling pressure (grade 2 diastolic dysfunction). - Aortic valve: Trileaflet; normal thickness, mildly calcified leaflets. - Mitral valve: There was trivial regurgitation. - Left atrium: The atrium was mildly dilated. - Atrial septum: There was increased thickness of the septum, consistent with lipomatous hypertrophy. - Tricuspid valve: There was trivial regurgitation.  ICD Interrogation 01/25/16:  St. Just single chamber pacemaker Battery 2.78 V Presenting rhythm is atrial fibrillation Intermittently paced at 70 bpm. Underlying rhythm atrial fibrillation with complete heart block Threshold: 1V @ 0.51ms Sensing: not performed due to complete heart block. Impedance: 436 Ohms  No events noted   Recent Labs: 02/02/2020: B Natriuretic Peptide 623.9 04/23/2020: ALT 45; BUN 32; Creatinine, Ser 1.43; Potassium 3.9; Sodium 135 06/14/2020: Hemoglobin 9.6; Platelets 410.0    Lipid Panel    Component Value Date/Time   CHOL 136 05/09/2019 0402   CHOL 168 07/16/2017 1555   TRIG 99 05/09/2019 0402   HDL 60 05/09/2019 0402   HDL 48 07/16/2017 1555   CHOLHDL 2.3 05/09/2019 0402   VLDL 20  05/09/2019 0402   LDLCALC 56 05/09/2019 0402   LDLCALC 99 07/16/2017 1555   LDLDIRECT 161.8 02/21/2013 1416      Wt Readings from Last 3 Encounters:  10/04/20 133 lb (60.3 kg)  09/04/20 126 lb (57.2 kg)  08/17/20 124 lb 3.2 oz (56.3 kg)      ASSESSMENT AND PLAN:  # HCM:  # Hypertension:  Blood pressure is well-controlled.  Exertional dyspnea and palpitations improved with metoprolol.    # Hyperlipidemia: Continue rosuvastatin.  LDL 56 on 04/2019.  She will come for fasting lipids/CMP  # Chronic atrial fibrillation, PPM, CHB:  Managed by Dr. Rayann Heman. She has a history of GI bleed and AVMs. However she is on Eliquis due to stroke She is not a candidate for a Watchman due to complications of the screening TEE. This patients CHA2DS2-VASc Score and unadjusted Ischemic Stroke Rate (% per year) is equal to 9.7 % stroke rate/year from a score of 6  Above score calculated as 1 point each if present [CHF, HTN, DM, Vascular=MI/PAD/Aortic Plaque, Age if 65-74, or Female] Above score calculated as 2 points each if present [Age > 75, or Stroke/TIA/TE]  # Chronic diastolic heart failure,  HCM, demand ischemia:  Ms. Verhagen is euvolemic on exam today but has exertional dyspnea.  Continue lasix and add metoprolol as above.  She can take an extra lasix 20mg  daily as needed for edema or shortness of breath.  Current medicines are reviewed at length with the patient today.  The patient does not have concerns regarding medicines.  The following changes have been made:  none  Labs/  tests ordered today include:   Orders Placed This Encounter  Procedures  . Lipid panel  . Comprehensive metabolic panel    Disposition:   FU with Latrina Guttman C. Oval Linsey, MD, [facility] [place] in 3 months   Signed, Gabbi Whetstone C. Oval Linsey, MD, [facility]  10/04/2020 5:11 PM    [place]

## 2020-10-04 NOTE — Patient Instructions (Signed)
Medication Instructions:  Your physician recommends that you continue on your current medications as directed. Please refer to the Current Medication list given to you today.  *If you need a refill on your cardiac medications before your next appointment, please call your pharmacy*  Lab Work: FASTING LP/CMET SOON  If you have labs (blood work) drawn today and your tests are completely normal, you will receive your results only by: [name] MyChart Message (if you have MyChart) OR . A paper copy in the mail If you have any lab test that is abnormal or we need to change your treatment, we will call you to review the results.  Testing/Procedures: NONE   Follow-Up: At [facility], you and your health needs are our priority.  As part of our continuing mission to provide you with exceptional heart care, we have created designated Provider Care Teams.  These Care Teams include your primary Cardiologist (physician) and Advanced Practice Providers (APPs -  Physician Assistants and Nurse Practitioners) who all work together to provide you with the care you need, when you need it.  We recommend signing up for the patient portal called "MyChart".  Sign up information is provided on this After Visit Summary.  MyChart is used to connect with patients for Virtual Visits (Telemedicine).  Patients are able to view lab/test results, encounter notes, upcoming appointments, etc.  Non-urgent messages can be sent to your provider as well.   To learn more about what you can do with MyChart, go to [email or web address].    Your next appointment:   6 month(s)  The format for your next appointment:   In Person  Provider:   You may see Skeet Latch, MD or one of the following Advanced Practice Providers on your designated Care Team:    Kerin Ransom, PA-C  [place], [place]  Coletta Memos, [place]

## 2020-10-08 NOTE — Progress Notes (Signed)
[facility] Note  10/12/2020     CHIEF COMPLAINT Patient presents for Retina Follow Up   HISTORY OF PRESENT ILLNESS: [name] is a 74 y.o. female who presents to the clinic today for:   HPI    Retina Follow Up    Patient presents with  Other.  In both eyes.  This started 6.5 weeks ago.  I, the attending physician,  performed the HPI with the patient and updated documentation appropriately.          Comments    Patient here for 6.5 weeks retina follow up for htn ret OU. Patient states vision so, so. About the same. Sometimes has eye pain.        Last edited by [name], MD on 10/12/2020  3:38 PM. (History)    Patient states vision seems about the same from last visit.   Referring physician: [name], MD [place],  [name] [id number]  HISTORICAL INFORMATION:   Selected notes from the MEDICAL RECORD NUMBER Referred by Dr. Herbert Deaner for ret eval   CURRENT MEDICATIONS: Current Outpatient Medications (Ophthalmic Drugs)  Medication Sig  . brimonidine (ALPHAGAN) 0.2 % ophthalmic solution Place 1 drop into the right eye 2 (two) times daily.  [name] ketorolac (ACULAR) 0.5 % ophthalmic solution Place 1 drop into the left eye 4 (four) times daily. Until 9/28  . latanoprost (XALATAN) 0.005 % ophthalmic solution Place 1 drop into both eyes at bedtime.  [name] ofloxacin (OCUFLOX) 0.3 % ophthalmic solution Place 1 drop into the right eye 4 (four) times daily.  . Olopatadine HCl 0.2 % SOLN Place 1-2 drops into both eyes daily as needed (itching).   . prednisoLONE acetate (PRED FORTE) 1 % ophthalmic suspension Place 1 drop into the right eye 4 (four) times daily.   No current facility-administered medications for this visit. (Ophthalmic Drugs)   Current Outpatient Medications (Other)  Medication Sig  . acetaminophen (TYLENOL) 650 MG CR tablet Take 650 mg by mouth every 8 (eight) hours as needed for pain.   [name] albuterol  (PROVENTIL HFA;VENTOLIN HFA) 108 (90 Base) MCG/ACT inhaler Inhale 1-2 puffs into the lungs every 6 (six) hours as needed for wheezing or shortness of breath.  . allopurinol (ZYLOPRIM) 100 MG tablet TAKE 2 TABLETS(200 MG) BY MOUTH DAILY (Patient taking differently: Take 200 mg by mouth daily. )  . ALPRAZolam (XANAX) 0.5 MG tablet Take 1 tablet (0.5 mg total) by mouth at bedtime as needed for anxiety.  [name] apixaban (ELIQUIS) 5 MG TABS tablet Take 1 tablet (5 mg total) by mouth 2 (two) times daily.  . cetirizine (ZYRTEC) 10 MG tablet Take 10 mg by mouth daily as needed for allergies.   . Cholecalciferol (VITAMIN D) 50 MCG (2000 UT) tablet Take 2,000 Units by mouth daily.  [name] Epoetin Alfa-epbx (RETACRIT IJ) Inject 1 Dose as directed once a week.   . ferrous sulfate 325 (65 FE) MG tablet TAKE 1 TABLET BY MOUTH TWICE DAILY WITH MEALS TO KEEP BLOOD COUNT UP (Patient taking differently: Take 325 mg by mouth 2 (two) times daily with a meal. )  . furosemide (LASIX) 20 MG tablet Take 1 tablet (20 mg total) by mouth daily. Daily as needed for weight gain of 2lbs in a day or 5lbs in a week (Patient taking differently: Take 20 mg by mouth daily. )  . ipratropium-albuterol (DUONEB) 0.5-2.5 (3) MG/3ML SOLN USE 3 ML VIA NEBULIZER EVERY 6 HOURS (Patient taking  differently: Inhale 3 mLs into the lungs every 6 (six) hours as needed (shortness of breath). )  . metoprolol tartrate (LOPRESSOR) 25 MG tablet Take 1 tablet (25 mg total) by mouth 2 (two) times daily.  . mirtazapine (REMERON) 7.5 MG tablet Take 7.5 mg by mouth at bedtime.  . nitroGLYCERIN (NITROSTAT) 0.4 MG SL tablet Place 1 tablet (0.4 mg total) under the tongue every 5 (five) minutes as needed for chest pain.  [name] oxymetazoline (AFRIN NASAL SPRAY) 0.05 % nasal spray Place 1 spray into both nostrils 2 (two) times daily. (Patient taking differently: Place 1 spray into both nostrils 2 (two) times daily as needed for congestion. )  . potassium chloride (K-DUR) 10 MEQ  tablet Take 1 tablet (10 mEq total) by mouth daily.  . rosuvastatin (CRESTOR) 20 MG tablet Take 1 tablet (20 mg total) by mouth at bedtime.  . sodium chloride (OCEAN) 0.65 % SOLN nasal spray Place 1 spray into both nostrils daily as needed for congestion.   [name] STIMULANT LAXATIVE 8.6-50 MG tablet Take 2 tablets by mouth at bedtime as needed for mild constipation.   . triamcinolone cream (KENALOG) 0.1 % Apply 1 application topically 2 (two) times daily. (Patient taking differently: Apply 1 application topically 2 (two) times daily as needed (lip rash). )   No current facility-administered medications for this visit. (Other)      REVIEW OF SYSTEMS: ROS    Positive for: Eyes   Last edited by Theodore Demark, COA on 10/12/2020  2:35 PM. (History)       ALLERGIES Allergies  Allergen Reactions  . Ace Inhibitors Swelling    angiodema  . Other Anaphylaxis and Swelling    Reaction to unspecified anesthesia   . Propofol     Dysrhythmia likely to drop in afterload post induction with Propofol. Did not have anaphylactic symptoms with MAC in 2018    PAST MEDICAL HISTORY Past Medical History:  Diagnosis Date  . Anemia   . Angioedema    2/2 ACE  . Arteriovenous malformation of stomach   . Arthritis   . Asthma   . AVM (arteriovenous malformation) of colon    small intestine; stomach  . Blood transfusion   . CHF (congestive heart failure) ([place])   . Complete heart block ([place])    s/p PPM 1998  . Complication of anesthesia    Difficult airway; anaphylaxis and swelling with propofol  . DDD (degenerative disc disease)   . Depression   . Diastolic heart failure   . Fatty liver 07/26/10  . GERD (gastroesophageal reflux disease)   . GI bleed   . History of alcohol abuse Stopped Fall 2012  . History of tobacco use Quit Fall 2012  . Hx of cardiovascular stress test    a. Lexiscan Myoview (10/15):  Small inferolateral and apical defect c/w scar and poss soft tissue attenuation, no  ischemia, EF 42%  . Hx of colonic polyp 08/13/10   adenomatous  . Hx of colonoscopy   . Hyperlipidemia   . Hypertension   . Hypertrophic cardiomyopathy ([place])    dx by Dr [name] 2009  . Iron deficiency anemia   . Myocardial infarction ([place])   . Pacemaker   . Panic attacks   . Permanent atrial fibrillation ([place])   . Renal failure    baseline creatinine 1.6  . Right arm pain 01/08/2012  . RLS (restless legs syndrome)    Dx 06/2007  . Sleep apnea    no cpap  Past Surgical History:  Procedure Laterality Date  . BIOPSY  09/04/2020   Procedure: BIOPSY;  Surgeon: Mauri Pole, MD;  Location: [facility];  Service: Endoscopy;;  . CARDIAC CATHETERIZATION    . CARDIAC CATHETERIZATION N/A 08/19/2016   Procedure: Right/Left Heart Cath and Coronary Angiography;  Surgeon: Jolaine Artist, MD;  Location: [place] [facility];  Service: Cardiovascular;  Laterality: N/A;  . COLONOSCOPY WITH PROPOFOL N/A 06/16/2017   Procedure: COLONOSCOPY WITH PROPOFOL;  Surgeon: Mauri Pole, MD;  Location: [facility];  Service: Endoscopy;  Laterality: N/A;  . COLONOSCOPY WITH PROPOFOL N/A 09/04/2020   Procedure: COLONOSCOPY WITH PROPOFOL;  Surgeon: Mauri Pole, MD;  Location: [facility];  Service: Endoscopy;  Laterality: N/A;  . DIRECT LARYNGOSCOPY N/A 09/18/2016   Procedure: DIRECT LARYNGOSCOPY, BRONCHOSCOPY, REMOVAL OF INTUBATION GRANULOMA;  Surgeon: [name], MD;  [facility];  Service: ENT;  Laterality: N/A;  . DIRECT LARYNGOSCOPY N/A 10/20/2016   Procedure: EXTUBATION AND FLEXIBLE LARYNGOSCOPE;  Surgeon: [name], MD;  Location: [place];  Service: ENT;  Laterality: N/A;  . DIRECT LARYNGOSCOPY N/A 10/29/2016   Procedure: DIRECT LARYNGOSCOPY;  Surgeon: [name], MD;  Location: [facility] OR;  Service: ENT;  Laterality: N/A;  . ESOPHAGOGASTRODUODENOSCOPY  12/23/2011   Procedure: ESOPHAGOGASTRODUODENOSCOPY (EGD);  Surgeon: [name], MD;  Location[facility];  Service:  Endoscopy;  Laterality: N/A;  . ESOPHAGOGASTRODUODENOSCOPY (EGD) WITH PROPOFOL N/A 06/16/2017   Procedure: ESOPHAGOGASTRODUODENOSCOPY (EGD) WITH PROPOFOL;  Surgeon: Mauri Pole, MD;  Location: [facility];  Service: Endoscopy;  Laterality: N/A;  . ESOPHAGOGASTRODUODENOSCOPY (EGD) WITH PROPOFOL N/A 09/04/2020   Procedure: ESOPHAGOGASTRODUODENOSCOPY (EGD) WITH PROPOFOL;  Surgeon: Mauri Pole, MD;  Location: [facility];  Service: Endoscopy;  Laterality: N/A;  . EXTUBATION (ENDOTRACHEAL) IN OR N/A 07/21/2016   Procedure: EXTUBATION (ENDOTRACHEAL) IN OR;  Surgeon: [name], MD;  Location: [place];  Service: ENT;  Laterality: N/A;  . GIVENS CAPSULE STUDY  12/23/2011   Procedure: GIVENS CAPSULE STUDY;  Surgeon: [name], MD;  Location: [facility];  Service: Endoscopy;  Laterality: N/A;  . HOT HEMOSTASIS N/A 09/04/2020   Procedure: HOT HEMOSTASIS (ARGON PLASMA COAGULATION/BICAP);  Surgeon: Mauri Pole, MD;  Location: [facility];  Service: Endoscopy;  Laterality: N/A;  . MICROLARYNGOSCOPY WITH LASER N/A 10/16/2016   Procedure: MICRODIRECTLARYNGOSCOPY WITH LASER ABLATION AND KENLOG INJECTION;  Surgeon: [name], MD;  Location: [place];  Service: ENT;  Laterality: N/A;  . PACEMAKER INSERTION  1998   [facility] jude, most recent gen change by [name] 4/12  . POLYPECTOMY  09/04/2020   Procedure: POLYPECTOMY;  Surgeon: Mauri Pole, MD;  Location: [facility];  Service: Endoscopy;;  . TRACHEOSTOMY TUBE PLACEMENT N/A 10/29/2016   Procedure: TRACHEOSTOMY;  Surgeon: [name], MD;  Location: [place];  Service: ENT;  Laterality: N/A;  . TUBAL LIGATION  04/01/2000    FAMILY HISTORY Family History  Problem Relation Age of Onset  . Hypertension Mother   . Stroke Mother   . Heart disease Mother   . Aneurysm Mother   . CVA Mother   . Hypertension Father   . Stroke Father   . Heart attack Father   . Alcohol abuse Father   . Hypertension Sister   . Hypertension Brother   .  Colon cancer Brother   . Cancer Brother   . Diabetes Sister   . Diabetes Brother   . Heart attack Brother   . Heart attack Brother   . Heart  disease Sister   . Alcohol abuse Brother   . Heart disease Daughter   . Esophageal cancer Neg Hx   . Stomach cancer Neg Hx   . Rectal cancer Neg Hx     SOCIAL HISTORY Social History   Tobacco Use  . Smoking status: Former Smoker    Packs/day: 0.10    Years: 50.00    Pack years: 5.00    Types: Cigarettes    Quit date: 08/16/2011    Years since quitting: 9.1  . Smokeless tobacco: Never Used  . Tobacco comment: quit 2012  Vaping Use  . Vaping Use: Never used  Substance Use Topics  . Alcohol use: No    Comment: quit 08/16/2011  . Drug use: No         OPHTHALMIC EXAM:  Base Eye Exam    Visual Acuity (Snellen - Linear)      Right Left   Dist [place] 20/20 20/30 -2   Dist ph [place]  20/25 -2       Tonometry (Tonopen, 2:32 PM)      Right Left   Pressure 11 12       Pupils      Dark Light Shape React APD   Right 2 1 Round Minimal None   Left 1 1 Round Minimal None       Visual Fields (Counting fingers)      Left Right    Full Full       Extraocular Movement      Right Left    Full Full       Neuro/Psych    Oriented x3: Yes   Mood/Affect: Normal       Dilation    Both eyes: 1.0% Mydriacyl, 2.5% Phenylephrine @ 2:32 PM        Slit Lamp and Fundus Exam    Slit Lamp Exam      Right Left   Lids/Lashes Dermatochalasis - upper lid Dermatochalasis - upper lid   Conjunctiva/Sclera Melanosis Melanosis, temporal pinguecula   Cornea arcus, trace Punctate epithelial erosions, well healed ST cataract wounds arcus, 2-3+Punctate epithelial erosions, well healed ST cataract wounds   Anterior Chamber deep and clear Deep and clear   Iris Round and dilated Round and dilated   Lens PC IOL in good position PC IOL in good position, trace Posterior capsular opacification   Vitreous Mild Vitreous syneresis, PVD Mild Vitreous syneresis        Fundus Exam      Right Left   Disc mild Pallor, Sharp rim, +PPP/PPA mild Pallor, Sharp rim, 360 PPA   C/D Ratio 0.55 0.65   Macula Flat, Blunted foveal reflex, mild RPE mottling, trace ERM Flat, Blunted foveal reflex, mild RPE mottling, trace ERM   Vessels Vascular attenuation, Tortuous---arterioles greater than venoules, mild Copper wiring, mild AV crossing changes Vascular attenuation, Tortuous--arterioles greater than venoules, Copper wiring, macro aneurysms and beading along ST arteriole -- no heme or edema   Periphery Attached Attached, cluster of MA's and vascular attenuation temporal periphery          IMAGING AND PROCEDURES  Imaging and Procedures for 10/12/2020  OCT, Retina - OU - Both Eyes       Right Eye Quality was good. Central Foveal Thickness: 234. Progression has been stable. Findings include normal foveal contour, no IRF, no SRF (Interval release of partial PVD; focal druse nasal macula).   Left Eye Quality was good. Central Foveal Thickness: 223. Progression has been stable.  Findings include abnormal foveal contour, no IRF, no SRF (Foveal notch nasal fovea, partial PVD).   Notes *Images captured and stored on drive  Diagnosis / Impression:  NFP, no IRF/SRF OU  OD: Interval release of partial PVD  Clinical management:  See below  Abbreviations: NFP - Normal foveal profile. CME - cystoid macular edema. PED - pigment epithelial detachment. IRF - intraretinal fluid. SRF - subretinal fluid. EZ - ellipsoid zone. ERM - epiretinal membrane. ORA - outer retinal atrophy. ORT - outer retinal tubulation. SRHM - subretinal hyper-reflective material. IRHM - intraretinal hyper-reflective material        Fluorescein Angiography Optos (Transit OS)       Right Eye   Progression has no prior data. Early phase findings include normal observations. Mid/Late phase findings include vascular perfusion defect.   Left Eye   Progression has no prior data. Early phase  findings include normal observations. Mid/Late phase findings include vascular perfusion defect, microaneurysm, leakage.   Notes **Images stored on drive**  Impression: OD: mild peripheral vascular nonperfusion OS: peripheral vascular nonperfusion with MA and late perivascular leakage                  ASSESSMENT/PLAN:    ICD-10-CM   1. Hypertensive retinopathy of both eyes  H35.033 Fluorescein Angiography Optos (Transit OS)  2. Essential hypertension  I10   3. Retinal edema  H35.81 OCT, Retina - OU - Both Eyes  4. Posterior vitreous detachment of right eye  H43.811   5. Pseudophakia, both eyes  Z96.1   6. Primary open angle glaucoma of both eyes, unspecified glaucoma stage  H40.1130    1,2. Hypertensive retinopathy OU  - exam shows 3+ tortuous vessels OU + OS with beading / early macroaneurysms of sup temp arterioles  - no frank heme or surrounding edema  - FA 10.29.21 shows mild peripheral vascular nonperfusion OU, OS with mild perivascular leakage  - BCVA improved today, 20/20 OD, 20/25 OS - discussed importance of tight BP control - monitor - f/u 4 months, sooner prn -- DFE/OCT  3. No retinal edema on exam or OCT  4. PVD OD  - interval development of PVD OD  - OCT OD shows interval release of partial PVD from disc  - Discussed findings and prognosis  - No RT or RD on 360 peripheral exam  - Reviewed s/s of RT/RD  - Strict return precautions for any such RT/RD signs/symptoms  - f/u in 4 months -- DFE/OCT  5. Pseudophakia OU  - s/p CE/IOL OU (Dr. Herbert Deaner, OS 6.1.21, OD 9.7.21)  - IOLs in good position, doing well  - monitor  6. Glaucoma  - under the expert management of Dr. Herbert Deaner  - IOP today 11,12  - on latanoprost QHS   Ophthalmic Meds Ordered this visit:  No orders of the defined types were placed in this encounter.      Return in about 4 months (around 02/11/2021) for DFE, OCT.  There are no Patient Instructions on file for this  visit.   Explained the diagnoses, plan, and follow up with the patient and they expressed understanding.  Patient expressed understanding of the importance of proper follow up care.  This document serves as a record of services personally performed by Gardiner Sleeper, MD, PhD. It was created on their behalf by Roselee Nova, COMT. The creation of this record is the provider's dictation and/or activities during the visit.  Electronically signed by: Roselee Nova, COMT 10/12/20 4:22 PM  This document serves as a record of services personally performed by Gardiner Sleeper, MD, PhD. It was created on their behalf by San Jetty. Owens Shark, OA an ophthalmic technician. The creation of this record is the provider's dictation and/or activities during the visit.    Electronically signed by: San Jetty. Cook, [place] 10.25.2021 4:22 PM   Gardiner Sleeper, M.D., Ph.D. Diseases & Surgery of the Retina and Vitreous [facility]  I have reviewed the above documentation for accuracy and completeness, and I agree with the above. Gardiner Sleeper, M.D., Ph.D. 10/12/20 4:22 PM   Abbreviations: M myopia (nearsighted); A astigmatism; H hyperopia (farsighted); P presbyopia; Mrx spectacle prescription;  CTL contact lenses; OD right eye; OS left eye; OU both eyes  XT exotropia; ET esotropia; PEK punctate epithelial keratitis; PEE punctate epithelial erosions; DES dry eye syndrome; MGD meibomian gland dysfunction; ATs artificial tears; PFAT's preservative free artificial tears; [place] nuclear sclerotic cataract; PSC posterior subcapsular cataract; ERM epi-retinal membrane; PVD posterior vitreous detachment; RD retinal detachment; DM diabetes mellitus; DR diabetic retinopathy; NPDR non-proliferative diabetic retinopathy; PDR proliferative diabetic retinopathy; CSME clinically significant macular edema; DME diabetic macular edema; dbh dot blot hemorrhages; CWS cotton wool spot; POAG primary open angle glaucoma; C/D  cup-to-disc ratio; HVF humphrey visual field; GVF goldmann visual field; OCT optical coherence tomography; IOP intraocular pressure; BRVO Branch retinal vein occlusion; CRVO central retinal vein occlusion; CRAO central retinal artery occlusion; BRAO branch retinal artery occlusion; RT retinal tear; SB scleral buckle; PPV pars plana vitrectomy; VH Vitreous hemorrhage; PRP panretinal laser photocoagulation; IVK intravitreal kenalog; VMT vitreomacular traction; MH Macular hole;  NVD neovascularization of the disc; NVE neovascularization elsewhere; AREDS age related eye disease study; ARMD age related macular degeneration; POAG primary open angle glaucoma; EBMD epithelial/anterior basement membrane dystrophy; ACIOL anterior chamber intraocular lens; IOL intraocular lens; PCIOL posterior chamber intraocular lens; Phaco/IOL phacoemulsification with intraocular lens placement; [place] photorefractive keratectomy; LASIK laser assisted in situ keratomileusis; HTN hypertension; DM diabetes mellitus; COPD chronic obstructive pulmonary disease

## 2020-10-10 ENCOUNTER — Other Ambulatory Visit (HOSPITAL_COMMUNITY): Payer: Self-pay | Admitting: *Deleted

## 2020-10-11 ENCOUNTER — Ambulatory Visit (HOSPITAL_COMMUNITY)
Admission: RE | Admit: 2020-10-11 | Discharge: 2020-10-11 | Disposition: A | Discharge status: Home / Self Care | Payer: Medicare (Managed Care) | Source: Ambulatory Visit | Attending: Family Medicine | Admitting: Family Medicine

## 2020-10-11 ENCOUNTER — Other Ambulatory Visit: Payer: Self-pay

## 2020-10-11 DIAGNOSIS — D649 Anemia, unspecified: Secondary | ICD-10-CM | POA: Diagnosis not present

## 2020-10-11 LAB — PREPARE RBC (CROSSMATCH)

## 2020-10-11 MED ORDER — SODIUM CHLORIDE 0.9 % IV SOLN
510.0000 mg | INTRAVENOUS | Status: DC
  Administered 2020-10-11: 510 mg via INTRAVENOUS
  Filled 2020-10-11: qty 17

## 2020-10-11 MED ORDER — SODIUM CHLORIDE 0.9% IV SOLUTION: Freq: Once | INTRAVENOUS | Status: DC

## 2020-10-12 ENCOUNTER — Other Ambulatory Visit: Payer: Self-pay

## 2020-10-12 ENCOUNTER — Ambulatory Visit (INDEPENDENT_AMBULATORY_CARE_PROVIDER_SITE_OTHER): Payer: Medicare (Managed Care) | Admitting: Ophthalmology

## 2020-10-12 ENCOUNTER — Encounter (INDEPENDENT_AMBULATORY_CARE_PROVIDER_SITE_OTHER): Payer: Self-pay | Admitting: Ophthalmology

## 2020-10-12 DIAGNOSIS — H43811 Vitreous degeneration, right eye: Secondary | ICD-10-CM

## 2020-10-12 DIAGNOSIS — H35033 Hypertensive retinopathy, bilateral: Secondary | ICD-10-CM

## 2020-10-12 DIAGNOSIS — Z961 Presence of intraocular lens: Secondary | ICD-10-CM

## 2020-10-12 DIAGNOSIS — H3581 Retinal edema: Secondary | ICD-10-CM

## 2020-10-12 DIAGNOSIS — I10 Essential (primary) hypertension: Secondary | ICD-10-CM | POA: Diagnosis not present

## 2020-10-12 DIAGNOSIS — H401130 Primary open-angle glaucoma, bilateral, stage unspecified: Secondary | ICD-10-CM

## 2020-10-12 LAB — BPAM RBC
Blood Product Expiration Date: 202111272359
ISSUE DATE / TIME: 202110281027
Unit Type and Rh: 5100

## 2020-10-12 LAB — TYPE AND SCREEN
ABO/RH(D): O POS
Antibody Screen: NEGATIVE
Unit division: 0

## 2020-10-17 ENCOUNTER — Other Ambulatory Visit (HOSPITAL_COMMUNITY): Payer: Self-pay | Admitting: *Deleted

## 2020-10-17 MED ORDER — SODIUM CHLORIDE 0.9 % IV SOLN
510.0000 mg | INTRAVENOUS | Status: DC
Start: 2020-10-17 — End: 2020-10-17

## 2020-10-18 ENCOUNTER — Encounter (HOSPITAL_COMMUNITY)
Admission: RE | Admit: 2020-10-18 | Discharge: 2020-10-18 | Disposition: A | Discharge status: Home / Self Care | Payer: Medicare (Managed Care) | Source: Ambulatory Visit | Attending: Family Medicine | Admitting: Family Medicine

## 2020-10-18 DIAGNOSIS — D509 Iron deficiency anemia, unspecified: Secondary | ICD-10-CM | POA: Diagnosis present

## 2020-10-18 MED ORDER — SODIUM CHLORIDE 0.9 % IV SOLN
510.0000 mg | Freq: Once | INTRAVENOUS | Status: AC
  Administered 2020-10-18: 510 mg via INTRAVENOUS
  Filled 2020-10-18: qty 17

## 2020-10-19 LAB — COMPREHENSIVE METABOLIC PANEL
ALT: 15 IU/L (ref 0–32)
AST: 21 IU/L (ref 0–40)
Albumin/Globulin Ratio: 1.2 (ref 1.2–2.2)
Albumin: 3.8 g/dL (ref 3.7–4.7)
Alkaline Phosphatase: 62 IU/L (ref 44–121)
BUN/Creatinine Ratio: 17 (ref 12–28)
BUN: 30 mg/dL — ABNORMAL HIGH (ref 8–27)
Bilirubin Total: 0.4 mg/dL (ref 0.0–1.2)
CO2: 27 mmol/L (ref 20–29)
Calcium: 10.5 mg/dL — ABNORMAL HIGH (ref 8.7–10.3)
Chloride: 100 mmol/L (ref 96–106)
Creatinine, Ser: 1.77 mg/dL — ABNORMAL HIGH (ref 0.57–1.00)
GFR calc Af Amer: 32 mL/min/{1.73_m2} — ABNORMAL LOW (ref >59)
GFR calc non Af Amer: 28 mL/min/{1.73_m2} — ABNORMAL LOW (ref >59)
Globulin, Total: 3.2 g/dL (ref 1.5–4.5)
Glucose: 108 mg/dL — ABNORMAL HIGH (ref 65–99)
Potassium: 3.9 mmol/L (ref 3.5–5.2)
Sodium: 142 mmol/L (ref 134–144)
Total Protein: 7 g/dL (ref 6.0–8.5)

## 2020-10-19 LAB — LIPID PANEL
Chol/HDL Ratio: 2.7 ratio (ref 0.0–4.4)
Cholesterol, Total: 151 mg/dL (ref 100–199)
HDL: 55 mg/dL (ref >39)
LDL Chol Calc (NIH): 76 mg/dL (ref 0–99)
Triglycerides: 114 mg/dL (ref 0–149)
VLDL Cholesterol Cal: 20 mg/dL (ref 5–40)

## 2020-11-20 ENCOUNTER — Other Ambulatory Visit (HOSPITAL_COMMUNITY): Payer: Self-pay | Admitting: *Deleted

## 2020-11-21 ENCOUNTER — Other Ambulatory Visit: Payer: Self-pay

## 2020-11-21 ENCOUNTER — Ambulatory Visit (HOSPITAL_COMMUNITY)
Admission: RE | Admit: 2020-11-21 | Discharge: 2020-11-21 | Disposition: A | Discharge status: Home / Self Care | Payer: Medicare (Managed Care) | Source: Ambulatory Visit | Attending: Nephrology | Admitting: Nephrology

## 2020-11-21 DIAGNOSIS — D631 Anemia in chronic kidney disease: Secondary | ICD-10-CM | POA: Diagnosis not present

## 2020-11-21 DIAGNOSIS — N184 Chronic kidney disease, stage 4 (severe): Secondary | ICD-10-CM | POA: Insufficient documentation

## 2020-11-21 MED ORDER — SODIUM CHLORIDE 0.9 % IV SOLN
510.0000 mg | Freq: Once | INTRAVENOUS | Status: AC
  Administered 2020-11-21: 510 mg via INTRAVENOUS
  Filled 2020-11-21: qty 17

## 2020-12-03 ENCOUNTER — Ambulatory Visit (INDEPENDENT_AMBULATORY_CARE_PROVIDER_SITE_OTHER): Payer: Medicare (Managed Care) | Admitting: Internal Medicine

## 2020-12-03 ENCOUNTER — Other Ambulatory Visit: Payer: Self-pay

## 2020-12-03 VITALS — BP 112/68 | HR 70 | Ht 65.0 in | Wt 123.8 lb

## 2020-12-03 DIAGNOSIS — I4821 Permanent atrial fibrillation: Secondary | ICD-10-CM | POA: Diagnosis not present

## 2020-12-03 DIAGNOSIS — D6869 Other thrombophilia: Secondary | ICD-10-CM | POA: Diagnosis not present

## 2020-12-03 DIAGNOSIS — I442 Atrioventricular block, complete: Secondary | ICD-10-CM | POA: Diagnosis not present

## 2020-12-03 LAB — CUP PACEART INCLINIC DEVICE CHECK
Battery Impedance: 1900 Ohm
Battery Voltage: 2.76 V
Brady Statistic RV Percent Paced: 99 %
Date Time Interrogation Session: 20211220145322
Implantable Lead Implant Date: 19860725
Implantable Lead Implant Date: 19860725
Implantable Lead Location: 753859
Implantable Lead Location: 753860
Implantable Lead Serial Number: 652511
Implantable Pulse Generator Implant Date: 20120424
Lead Channel Impedance Value: 434 Ohm
Lead Channel Pacing Threshold Amplitude: 1 V
Lead Channel Pacing Threshold Pulse Width: 0.5 ms
Lead Channel Setting Pacing Amplitude: 2.5 V
Lead Channel Setting Pacing Pulse Width: 0.5 ms
Lead Channel Setting Sensing Sensitivity: 8 mV
Pulse Gen Serial Number: 2406848

## 2020-12-03 NOTE — Patient Instructions (Addendum)
Medication Instructions:  Your physician recommends that you continue on your current medications as directed. Please refer to the Current Medication list given to you today.  *If you need a refill on your cardiac medications before your next appointment, please call your pharmacy*  Lab Work: None ordered.  If you have labs (blood work) drawn today and your tests are completely normal, you will receive your results only by: [name] MyChart Message (if you have MyChart) OR . A paper copy in the mail If you have any lab test that is abnormal or we need to change your treatment, we will call you to review the results.  Testing/Procedures: None ordered.  Follow-Up: At [facility], you and your health needs are our priority.  As part of our continuing mission to provide you with exceptional heart care, we have created designated Provider Care Teams.  These Care Teams include your primary Cardiologist (physician) and Advanced Practice Providers (APPs -  Physician Assistants and Nurse Practitioners) who all work together to provide you with the care you need, when you need it.  We recommend signing up for the patient portal called "MyChart".  Sign up information is provided on this After Visit Summary.  MyChart is used to connect with patients for Virtual Visits (Telemedicine).  Patients are able to view lab/test results, encounter notes, upcoming appointments, etc.  Non-urgent messages can be sent to your provider as well.   To learn more about what you can do with MyChart, go to [email or web address].    Your next appointment:   Your physician wants you to follow-up in: 6 months with device clinic & 1 year with [facility]. You will receive a reminder letter in the mail two months in advance. If you don't receive a letter, please call our office to schedule the follow-up appointment.    Other Instructions:

## 2020-12-03 NOTE — Progress Notes (Signed)
PCP: [facility] [place] Primary Cardiologist: Dr Oval Linsey Primary EP:  Dr Rayann Heman  [name] is a 74 y.o. female who presents today for routine electrophysiology followup.  Since last being seen in our clinic, the patient reports doing very well.  Today, she denies symptoms of palpitations, chest pain, shortness of breath,  lower extremity edema, dizziness, presyncope, or syncope.  The patient is otherwise without complaint today.   Past Medical History:  Diagnosis Date  . Anemia   . Angioedema    2/2 ACE  . Arteriovenous malformation of stomach   . Arthritis   . Asthma   . AVM (arteriovenous malformation) of colon    small intestine; stomach  . Blood transfusion   . CHF (congestive heart failure) ([place])   . Complete heart block ([place])    s/p PPM 1998  . Complication of anesthesia    Difficult airway; anaphylaxis and swelling with propofol  . DDD (degenerative disc disease)   . Depression   . Diastolic heart failure   . Fatty liver 07/26/10  . GERD (gastroesophageal reflux disease)   . GI bleed   . History of alcohol abuse Stopped Fall 2012  . History of tobacco use Quit Fall 2012  . Hx of cardiovascular stress test    a. Lexiscan Myoview (10/15):  Small inferolateral and apical defect c/w scar and poss soft tissue attenuation, no ischemia, EF 42%  . Hx of colonic polyp 08/13/10   adenomatous  . Hx of colonoscopy   . Hyperlipidemia   . Hypertension   . Hypertrophic cardiomyopathy ([place])    dx by Dr [name] 2009  . Iron deficiency anemia   . Myocardial infarction ([place])   . Pacemaker   . Panic attacks   . Permanent atrial fibrillation ([place])   . Renal failure    baseline creatinine 1.6  . Right arm pain 01/08/2012  . RLS (restless legs syndrome)    Dx 06/2007  . Sleep apnea    no cpap    Past Surgical History:  Procedure Laterality Date  . BIOPSY  09/04/2020   Procedure: BIOPSY;  Surgeon: Mauri Pole, MD;  Location: [facility]  ENDOSCOPY;  Service: Endoscopy;;  . CARDIAC CATHETERIZATION    . CARDIAC CATHETERIZATION N/A 08/19/2016   Procedure: Right/Left Heart Cath and Coronary Angiography;  Surgeon: Jolaine Artist, MD;  Location: [place] [facility] LAB;  Service: Cardiovascular;  Laterality: N/A;  . COLONOSCOPY WITH PROPOFOL N/A 06/16/2017   Procedure: COLONOSCOPY WITH PROPOFOL;  Surgeon: Mauri Pole, MD;  Location: [facility] ENDOSCOPY;  Service: Endoscopy;  Laterality: N/A;  . COLONOSCOPY WITH PROPOFOL N/A 09/04/2020   Procedure: COLONOSCOPY WITH PROPOFOL;  Surgeon: Mauri Pole, MD;  Location: [facility] ENDOSCOPY;  Service: Endoscopy;  Laterality: N/A;  . DIRECT LARYNGOSCOPY N/A 09/18/2016   Procedure: DIRECT LARYNGOSCOPY, BRONCHOSCOPY, REMOVAL OF INTUBATION GRANULOMA;  Surgeon: [name], MD;  Location: [facility] OR;  Service: ENT;  Laterality: N/A;  . DIRECT LARYNGOSCOPY N/A 10/20/2016   Procedure: EXTUBATION AND FLEXIBLE LARYNGOSCOPE;  Surgeon: [name], MD;  Location: [place];  Service: ENT;  Laterality: N/A;  . DIRECT LARYNGOSCOPY N/A 10/29/2016   Procedure: DIRECT LARYNGOSCOPY;  Surgeon: [name], MD;  Location[facility] OR;  Service: ENT;  Laterality: N/A;  . ESOPHAGOGASTRODUODENOSCOPY  12/23/2011   Procedure: ESOPHAGOGASTRODUODENOSCOPY (EGD);  Surgeon: [name], MD;  Location: [facility];  Service: Endoscopy;  Laterality: N/A;  . ESOPHAGOGASTRODUODENOSCOPY (EGD) WITH PROPOFOL N/A 06/16/2017   Procedure:  ESOPHAGOGASTRODUODENOSCOPY (EGD) WITH PROPOFOL;  Surgeon: Mauri Pole, MD;  Location: [facility] ENDOS[facility];  Service: Endoscopy;  Laterality: N/A;  . ESOPHAGOGASTRODUODENOSCOPY (EGD) WITH PROPOFOL N/A 09/04/2020   Procedure: ESOPHAGOGASTRODUODENOSCOPY (EGD) WITH PROPOFOL;  Surgeon: Mauri Pole, MD;  Location: [facility] ENDOS[facility];  Service: Endoscopy;  Laterality: N/A;  . EXTUBATION (ENDOTRACHEAL) IN OR N/A 07/21/2016   Procedure: EXTUBATION (ENDOTRACHEAL) IN OR;  Surgeon: [name], MD;  Location: [place];   Service: ENT;  Laterality: N/A;  . GIVENS CAPSULE STUDY  12/23/2011   Procedure: GIVENS CAPSULE STUDY;  Surgeon: [name], MD;  Location: [facility] ENDOS[facility];  Service: Endoscopy;  Laterality: N/A;  . HOT HEMOSTASIS N/A 09/04/2020   Procedure: HOT HEMOSTASIS (ARGON PLASMA COAGULATION/BICAP);  Surgeon: Mauri Pole, MD;  Location: [facility];  Service: Endoscopy;  Laterality: N/A;  . MICROLARYNGOSCOPY WITH LASER N/A 10/16/2016   Procedure: MICRODIRECTLARYNGOSCOPY WITH LASER ABLATION AND KENLOG INJECTION;  Surgeon: [name], MD;  Location: [place];  Service: ENT;  Laterality: N/A;  . PACEMAKER INSERTION  1998   st jude, most recent gen change by [name] 4/12  . POLYPECTOMY  09/04/2020   Procedure: POLYPECTOMY;  Surgeon: Mauri Pole, MD;  Location: [facility];  Service: Endoscopy;;  . TRACHEOSTOMY TUBE PLACEMENT N/A 10/29/2016   Procedure: TRACHEOSTOMY;  Surgeon: [name], MD;  Location: [place];  Service: ENT;  Laterality: N/A;  . TUBAL LIGATION  04/01/2000    ROS- all systems are reviewed and negative except as per HPI above  Current Outpatient Medications  Medication Sig Dispense Refill  . acetaminophen (TYLENOL) 650 MG CR tablet Take 650 mg by mouth every 8 (eight) hours as needed for pain.     [name] albuterol (PROVENTIL HFA;VENTOLIN HFA) 108 (90 Base) MCG/ACT inhaler Inhale 1-2 puffs into the lungs every 6 (six) hours as needed for wheezing or shortness of breath. 1 Inhaler 6  . allopurinol (ZYLOPRIM) 100 MG tablet TAKE 2 TABLETS(200 MG) BY MOUTH DAILY 180 tablet 3  . ALPRAZolam (XANAX) 0.5 MG tablet Take 1 tablet (0.5 mg total) by mouth at bedtime as needed for anxiety. 30 tablet 0  . apixaban (ELIQUIS) 5 MG TABS tablet Take 1 tablet (5 mg total) by mouth 2 (two) times daily. 180 tablet 1  . brimonidine (ALPHAGAN) 0.2 % ophthalmic solution Place 1 drop into the right eye 2 (two) times daily.    . cetirizine (ZYRTEC) 10 MG tablet Take 10 mg by mouth daily as needed for allergies.      . Cholecalciferol (VITAMIN D) 50 MCG (2000 UT) tablet Take 2,000 Units by mouth daily.    [name] Epoetin Alfa-epbx (RETACRIT IJ) Inject 1 Dose as directed once a week.     . ferrous sulfate 325 (65 FE) MG tablet TAKE 1 TABLET BY MOUTH TWICE DAILY WITH MEALS TO KEEP BLOOD COUNT UP 60 tablet 11  . furosemide (LASIX) 20 MG tablet Take 1 tablet (20 mg total) by mouth daily. Daily as needed for weight gain of 2lbs in a day or 5lbs in a week 30 tablet 6  . ipratropium-albuterol (DUONEB) 0.5-2.5 (3) MG/3ML SOLN USE 3 ML VIA NEBULIZER EVERY 6 HOURS 360 mL 0  . latanoprost (XALATAN) 0.005 % ophthalmic solution Place 1 drop into both eyes at bedtime.    . metoprolol tartrate (LOPRESSOR) 25 MG tablet Take 1 tablet (25 mg total) by mouth 2 (two) times daily. 60 tablet 5  . mirtazapine (REMERON) 7.5 MG tablet Take 7.5 mg by mouth at bedtime.    [name]  nitroGLYCERIN (NITROSTAT) 0.4 MG SL tablet Place 1 tablet (0.4 mg total) under the tongue every 5 (five) minutes as needed for chest pain. 25 tablet 1  . Olopatadine HCl 0.2 % SOLN Place 1-2 drops into both eyes daily as needed (itching).   1  . oxymetazoline (AFRIN NASAL SPRAY) 0.05 % nasal spray Place 1 spray into both nostrils 2 (two) times daily. 30 mL 0  . potassium chloride (K-DUR) 10 MEQ tablet Take 1 tablet (10 mEq total) by mouth daily. 90 tablet 0  . rosuvastatin (CRESTOR) 20 MG tablet Take 1 tablet (20 mg total) by mouth at bedtime. 90 tablet 3  . sodium chloride (OCEAN) 0.65 % SOLN nasal spray Place 1 spray into both nostrils daily as needed for congestion.     [name] STIMULANT LAXATIVE 8.6-50 MG tablet Take 2 tablets by mouth at bedtime as needed for mild constipation.     . triamcinolone cream (KENALOG) 0.1 % Apply 1 application topically 2 (two) times daily. 30 g 2   No current facility-administered medications for this visit.    Physical Exam: Vitals:   12/03/20 1431  BP: 112/68  Pulse: 70  SpO2: (!) 7%  Weight: 123 lb 12.8 oz (56.2 kg)  Height: 5'  5" (1.651 m)    GEN- The patient is well appearing, alert and oriented x 3 today.   Head- normocephalic, atraumatic Eyes-  Sclera clear, conjunctiva pink Ears- hearing intact Oropharynx- clear Neck- trach collar Lungs- Clear to ausculation bilaterally, normal work of breathing Chest- pacemaker pocket is well healed Heart- Regular rate and rhythm, no murmurs, rubs or gallops, PMI not laterally displaced GI- soft, NT, ND, + BS Extremities- no clubbing, cyanosis, or edema  Pacemaker interrogation- reviewed in detail today,  See PACEART report  ekg tracing ordered today is personally reviewed and shows afib, V paced  Assessment and Plan:  1. Symptomatic complete heart block Normal pacemaker function See Pace Art report No changes today she is device dependant today Device is not remote capable  2. Permanent afib Rate controlled Has had prior GI bleeds.  Currently doing ok with eliquis but requiring tranfusion/ iron infusions Not a canddiate for Watchman due to prior difficulty with screening TEE  3. HTN Stable No change required today  4. Chronic diastolic dysfunction Stable No change required today   Risks, benefits and potential toxicities for medications prescribed and/or refilled reviewed with patient today.   Return device clinic in 6 months and to see EP PA in a year  Thompson Grayer MD, [facility] 12/03/2020 2:47 PM

## 2021-01-03 ENCOUNTER — Other Ambulatory Visit (HOSPITAL_COMMUNITY): Payer: Self-pay

## 2021-01-04 ENCOUNTER — Encounter (HOSPITAL_COMMUNITY)
Admission: RE | Admit: 2021-01-04 | Discharge: 2021-01-04 | Disposition: A | Discharge status: Home / Self Care | Payer: Medicare (Managed Care) | Source: Ambulatory Visit | Attending: Nurse Practitioner | Admitting: Nurse Practitioner

## 2021-01-04 ENCOUNTER — Other Ambulatory Visit: Payer: Self-pay

## 2021-01-04 ENCOUNTER — Other Ambulatory Visit (HOSPITAL_COMMUNITY): Payer: Self-pay | Admitting: *Deleted

## 2021-01-04 DIAGNOSIS — N189 Chronic kidney disease, unspecified: Secondary | ICD-10-CM | POA: Insufficient documentation

## 2021-01-04 DIAGNOSIS — D631 Anemia in chronic kidney disease: Secondary | ICD-10-CM | POA: Insufficient documentation

## 2021-01-04 DIAGNOSIS — D509 Iron deficiency anemia, unspecified: Secondary | ICD-10-CM | POA: Insufficient documentation

## 2021-01-04 LAB — PREPARE RBC (CROSSMATCH)

## 2021-01-07 ENCOUNTER — Encounter (HOSPITAL_COMMUNITY): Payer: Medicare (Managed Care)

## 2021-01-07 ENCOUNTER — Encounter (HOSPITAL_COMMUNITY)
Admission: RE | Admit: 2021-01-07 | Discharge: 2021-01-07 | Disposition: A | Discharge status: Home / Self Care | Payer: Medicare (Managed Care) | Source: Ambulatory Visit | Attending: Family Medicine | Admitting: Family Medicine

## 2021-01-07 ENCOUNTER — Other Ambulatory Visit: Payer: Self-pay

## 2021-01-07 DIAGNOSIS — N189 Chronic kidney disease, unspecified: Secondary | ICD-10-CM | POA: Diagnosis not present

## 2021-01-07 LAB — HEMOGLOBIN AND HEMATOCRIT, BLOOD
HCT: 27.9 % — ABNORMAL LOW (ref 36.0–46.0)
Hemoglobin: 8.1 g/dL — ABNORMAL LOW (ref 12.0–15.0)

## 2021-01-07 MED ORDER — SODIUM CHLORIDE 0.9 % IV SOLN
510.0000 mg | INTRAVENOUS | Status: DC
  Administered 2021-01-07: 510 mg via INTRAVENOUS
  Filled 2021-01-07: qty 17

## 2021-01-07 MED ORDER — SODIUM CHLORIDE 0.9% IV SOLUTION: Freq: Once | INTRAVENOUS | Status: DC

## 2021-01-07 NOTE — Progress Notes (Signed)
Pt here for blood transfusion today.  2 units ordered but due to blood shortage,Mediall Director of  blood bank requesting hgb after first unit.  That hgb was 8.1 and parameters not met to give 2nd unit.  Dr Cletis Athens notified of lab results and gave okay to just give one unit of PRBCs today. She will revisit if needed.  Daughter made aware

## 2021-01-08 LAB — TYPE AND SCREEN
ABO/RH(D): O POS
Antibody Screen: NEGATIVE
Unit division: 0

## 2021-01-08 LAB — BPAM RBC
Blood Product Expiration Date: 202202022359
ISSUE DATE / TIME: 202201240901
Unit Type and Rh: 5100

## 2021-01-14 ENCOUNTER — Encounter (HOSPITAL_COMMUNITY)
Admission: RE | Admit: 2021-01-14 | Discharge: 2021-01-14 | Disposition: A | Discharge status: Home / Self Care | Payer: Medicare (Managed Care) | Source: Ambulatory Visit | Attending: Nephrology | Admitting: Nephrology

## 2021-01-14 ENCOUNTER — Other Ambulatory Visit: Payer: Self-pay

## 2021-01-14 DIAGNOSIS — N189 Chronic kidney disease, unspecified: Secondary | ICD-10-CM | POA: Diagnosis not present

## 2021-01-14 MED ORDER — SODIUM CHLORIDE 0.9 % IV SOLN
510.0000 mg | INTRAVENOUS | Status: AC
  Administered 2021-01-14: 510 mg via INTRAVENOUS
  Filled 2021-01-14: qty 17

## 2021-01-15 ENCOUNTER — Encounter (HOSPITAL_COMMUNITY): Payer: Medicare (Managed Care)

## 2021-02-07 NOTE — Progress Notes (Addendum)
[facility] Note  02/11/2021     CHIEF COMPLAINT Patient presents for Retina Follow Up   HISTORY OF PRESENT ILLNESS: [name] is a 75 y.o. female who presents to the clinic today for:   HPI    Retina Follow Up    Patient presents with  Other.  In both eyes.  Duration of 4 months.  Since onset it is stable.  I, the attending physician,  performed the HPI with the patient and updated documentation appropriately.          Comments    4 month follow up HTN Retinopathy OU-  No new problems since last visit.  Poor historian, pt states she forgets things so her daughter helps take care of her.  She said she thinks her daughter only uses the drop at night and no other drop.  She should be on Latanoprost qhs OU and Brimonidine BID OD.       Last edited by [name], MD on 02/11/2021  4:11 PM. (History)    Patient states vision seems stable, she is using latanoprost QHS, pt has iron and blood transfusions twice a month due to blood loss, daughter states drs are unsure of where the blood is coming from  Referring physician: [name], MD [place],  [place] [address]  HISTORICAL INFORMATION:   Selected notes from the [facility] Referred by Dr. Herbert Deaner for ret eval   CURRENT MEDICATIONS: Current Outpatient Medications (Ophthalmic Drugs)  Medication Sig  . latanoprost (XALATAN) 0.005 % ophthalmic solution Place 1 drop into both eyes at bedtime.  . Olopatadine HCl 0.2 % SOLN Place 1-2 drops into both eyes daily as needed (itching).   . brimonidine (ALPHAGAN) 0.2 % ophthalmic solution Place 1 drop into the right eye 2 (two) times daily. (Patient not taking: Reported on 02/11/2021)   No current facility-administered medications for this visit. (Ophthalmic Drugs)   Current Outpatient Medications (Other)  Medication Sig  . acetaminophen (TYLENOL) 650 MG CR tablet Take 650 mg by mouth every 8 (eight) hours as  needed for pain.   [name] albuterol (PROVENTIL HFA;VENTOLIN HFA) 108 (90 Base) MCG/ACT inhaler Inhale 1-2 puffs into the lungs every 6 (six) hours as needed for wheezing or shortness of breath.  . allopurinol (ZYLOPRIM) 100 MG tablet TAKE 2 TABLETS(200 MG) BY MOUTH DAILY  . ALPRAZolam (XANAX) 0.5 MG tablet Take 1 tablet (0.5 mg total) by mouth at bedtime as needed for anxiety.  [name] apixaban (ELIQUIS) 5 MG TABS tablet Take 1 tablet (5 mg total) by mouth 2 (two) times daily.  . cetirizine (ZYRTEC) 10 MG tablet Take 10 mg by mouth daily as needed for allergies.   . Cholecalciferol (VITAMIN D) 50 MCG (2000 UT) tablet Take 2,000 Units by mouth daily.  [name] Epoetin Alfa-epbx (RETACRIT IJ) Inject 1 Dose as directed once a week.   . ferrous sulfate 325 (65 FE) MG tablet TAKE 1 TABLET BY MOUTH TWICE DAILY WITH MEALS TO KEEP BLOOD COUNT UP  . furosemide (LASIX) 20 MG tablet Take 1 tablet (20 mg total) by mouth daily. Daily as needed for weight gain of 2lbs in a day or 5lbs in a week  . ipratropium-albuterol (DUONEB) 0.5-2.5 (3) MG/3ML SOLN USE 3 ML VIA NEBULIZER EVERY 6 HOURS  . metoprolol tartrate (LOPRESSOR) 25 MG tablet Take 1 tablet (25 mg total) by mouth 2 (two) times daily.  . mirtazapine (REMERON) 7.5 MG tablet Take 7.5 mg  by mouth at bedtime.  . nitroGLYCERIN (NITROSTAT) 0.4 MG SL tablet Place 1 tablet (0.4 mg total) under the tongue every 5 (five) minutes as needed for chest pain.  [name] oxymetazoline (AFRIN NASAL SPRAY) 0.05 % nasal spray Place 1 spray into both nostrils 2 (two) times daily.  . potassium chloride (K-DUR) 10 MEQ tablet Take 1 tablet (10 mEq total) by mouth daily.  . rosuvastatin (CRESTOR) 20 MG tablet Take 1 tablet (20 mg total) by mouth at bedtime.  . sodium chloride (OCEAN) 0.65 % SOLN nasal spray Place 1 spray into both nostrils daily as needed for congestion.   [name] STIMULANT LAXATIVE 8.6-50 MG tablet Take 2 tablets by mouth at bedtime as needed for mild constipation.   . triamcinolone cream  (KENALOG) 0.1 % Apply 1 application topically 2 (two) times daily.   No current facility-administered medications for this visit. (Other)      REVIEW OF SYSTEMS: ROS    Positive for: Skin, Genitourinary, Musculoskeletal, Cardiovascular, Eyes, Respiratory, Psychiatric, Allergic/Imm   Negative for: Constitutional, Gastrointestinal, Neurological, HENT, Endocrine, Heme/Lymph   Last edited by Leonie Douglas, COA on 02/11/2021  2:19 PM. (History)       ALLERGIES Allergies  Allergen Reactions  . Ace Inhibitors Swelling    angiodema  . Other Anaphylaxis and Swelling    Reaction to unspecified anesthesia   . Propofol     Dysrhythmia likely to drop in afterload post induction with Propofol. Did not have anaphylactic symptoms with MAC in 2018    PAST MEDICAL HISTORY Past Medical History:  Diagnosis Date  . Anemia   . Angioedema    2/2 ACE  . Arteriovenous malformation of stomach   . Arthritis   . Asthma   . AVM (arteriovenous malformation) of colon    small intestine; stomach  . Blood transfusion   . CHF (congestive heart failure) ([place])   . Complete heart block ([place])    s/p PPM 1998  . Complication of anesthesia    Difficult airway; anaphylaxis and swelling with propofol  . DDD (degenerative disc disease)   . Depression   . Diastolic heart failure   . Fatty liver 07/26/10  . GERD (gastroesophageal reflux disease)   . GI bleed   . History of alcohol abuse Stopped Fall 2012  . History of tobacco use Quit Fall 2012  . Hx of cardiovascular stress test    a. Lexiscan Myoview (10/15):  Small inferolateral and apical defect c/w scar and poss soft tissue attenuation, no ischemia, EF 42%  . Hx of colonic polyp 08/13/10   adenomatous  . Hx of colonoscopy   . Hyperlipidemia   . Hypertension   . Hypertrophic cardiomyopathy ([place])    dx by Dr [name] 2009  . Iron deficiency anemia   . Myocardial infarction ([place])   . Pacemaker   . Panic attacks   . Permanent atrial fibrillation  ([place])   . Renal failure    baseline creatinine 1.6  . Right arm pain 01/08/2012  . RLS (restless legs syndrome)    Dx 06/2007  . Sleep apnea    no cpap    Past Surgical History:  Procedure Laterality Date  . BIOPSY  09/04/2020   Procedure: BIOPSY;  Surgeon: Mauri Pole, MD;  Location: [facility];  Service: Endoscopy;;  . CARDIAC CATHETERIZATION    . CARDIAC CATHETERIZATION N/A 08/19/2016   Procedure: Right/Left Heart Cath and Coronary Angiography;  Surgeon: Jolaine Artist, MD;  Location: [facility];  Service: Cardiovascular;  Laterality: N/A;  . COLONOSCOPY WITH PROPOFOL N/A 06/16/2017   Procedure: COLONOSCOPY WITH PROPOFOL;  Surgeon: Mauri Pole, MD;  Location: [facility];  Service: Endoscopy;  Laterality: N/A;  . COLONOSCOPY WITH PROPOFOL N/A 09/04/2020   Procedure: COLONOSCOPY WITH PROPOFOL;  Surgeon: Mauri Pole, MD;  Location: [facility];  Service: Endoscopy;  Laterality: N/A;  . DIRECT LARYNGOSCOPY N/A 09/18/2016   Procedure: DIRECT LARYNGOSCOPY, BRONCHOSCOPY, REMOVAL OF INTUBATION GRANULOMA;  Surgeon: [name], MD;  Location: [facility];  Service: ENT;  Laterality: N/A;  . DIRECT LARYNGOSCOPY N/A 10/20/2016   Procedure: EXTUBATION AND FLEXIBLE LARYNGOSCOPE;  Surgeon: [name], MD;  Location: [facility];  Service: ENT;  Laterality: N/A;  . DIRECT LARYNGOSCOPY N/A 10/29/2016   Procedure: DIRECT LARYNGOSCOPY;  Surgeon: [name], MD;  Location: [facility];  Service: ENT;  Laterality: N/A;  . ESOPHAGOGASTRODUODENOSCOPY  12/23/2011   Procedure: ESOPHAGOGASTRODUODENOSCOPY (EGD);  Surgeon: [name], MD;  Location: [facility];  Service: Endoscopy;  Laterality: N/A;  . ESOPHAGOGASTRODUODENOSCOPY (EGD) WITH PROPOFOL N/A 06/16/2017   Procedure: ESOPHAGOGASTRODUODENOSCOPY (EGD) WITH PROPOFOL;  Surgeon: Mauri Pole, MD;  [facility]: [facility];  Service: Endoscopy;  Laterality: N/A;  . ESOPHAGOGASTRODUODENOSCOPY (EGD) WITH PROPOFOL N/A 09/04/2020    Procedure: ESOPHAGOGASTRODUODENOSCOPY (EGD) WITH PROPOFOL;  Surgeon: Mauri Pole, MD;  Location: [facility];  Service: Endoscopy;  Laterality: N/A;  . EXTUBATION (ENDOTRACHEAL) IN OR N/A 07/21/2016   Procedure: EXTUBATION (ENDOTRACHEAL) IN OR;  Surgeon: [name], MD;  Location: [facility];  Service: ENT;  Laterality: N/A;  . GIVENS CAPSULE STUDY  12/23/2011   Procedure: GIVENS CAPSULE STUDY;  Surgeon: [name], MD;  Location: [facility];  Service: Endoscopy;  Laterality: N/A;  . HOT HEMOSTASIS N/A 09/04/2020   Procedure: HOT HEMOSTASIS (ARGON PLASMA COAGULATION/BICAP);  Surgeon: Mauri Pole, MD;  Location[facility];  Service: Endoscopy;  Laterality: N/A;  . MICROLARYNGOSCOPY WITH LASER N/A 10/16/2016   Procedure: MICRODIRECTLARYNGOSCOPY WITH LASER ABLATION AND KENLOG INJECTION;  Surgeon: [name], MD;  Location: [facility];  Service: ENT;  Laterality: N/A;  . PACEMAKER INSERTION  1998   st jude, most recent gen change by [name] 4/12  . POLYPECTOMY  09/04/2020   Procedure: POLYPECTOMY;  Surgeon: Mauri Pole, MD;  Location: [facility];  Service: Endoscopy;;  . TRACHEOSTOMY TUBE PLACEMENT N/A 10/29/2016   Procedure: TRACHEOSTOMY;  Surgeon: [name], MD;  Location: [facility];  Service: ENT;  Laterality: N/A;  . TUBAL LIGATION  04/01/2000    FAMILY HISTORY Family History  Problem Relation Age of Onset  . Hypertension Mother   . Stroke Mother   . Heart disease Mother   . Aneurysm Mother   . CVA Mother   . Hypertension Father   . Stroke Father   . Heart attack Father   . Alcohol abuse Father   . Hypertension Sister   . Hypertension Brother   . Colon cancer Brother   . Cancer Brother   . Diabetes Sister   . Diabetes Brother   . Heart attack Brother   . Heart attack Brother   . Heart disease Sister   . Alcohol abuse Brother   . Heart disease Daughter   . Esophageal cancer Neg Hx   . Stomach cancer Neg Hx   . Rectal cancer Neg Hx     SOCIAL  HISTORY Social History   Tobacco Use  . Smoking status: Former Smoker    Packs/day: 0.10    Years: 50.00  Pack years: 5.00    Types: Cigarettes    Quit date: 08/16/2011    Years since quitting: 9.4  . Smokeless tobacco: Never Used  . Tobacco comment: quit 2012  Vaping Use  . Vaping Use: Never used  Substance Use Topics  . Alcohol use: No    Comment: quit 08/16/2011  . Drug use: No         OPHTHALMIC EXAM:  Base Eye Exam    Visual Acuity (Snellen - Linear)      Right Left   Dist [place] 20/20 20/40   Dist ph [place]  20/30       Tonometry (Tonopen, 2:31 PM)      Right Left   Pressure 15 13       Pupils      Dark Light Shape React APD   Right 2 1 Round Minimal None   Left 2 1 Round Minimal None       Visual Fields (Counting fingers)      Left Right    Full Full       Extraocular Movement      Right Left    Full Full       Neuro/Psych    Oriented x3: Yes   Mood/Affect: Normal       Dilation    Both eyes: 1.0% Mydriacyl, 2.5% Phenylephrine @ 2:31 PM        Slit Lamp and Fundus Exam    Slit Lamp Exam      Right Left   Lids/Lashes Dermatochalasis - upper lid Dermatochalasis - upper lid   Conjunctiva/Sclera Melanosis Melanosis, temporal pinguecula   Cornea arcus, 1-2+ Punctate epithelial erosions, well healed ST cataract wounds arcus, 1-2+Punctate epithelial erosions, well healed ST cataract wounds   Anterior Chamber deep and clear Deep and clear   Iris Round and moderately dilated, mild focal atrophy at 1030, no NVI round and poorly dilated to 3.9m   Lens PC IOL in good position PC IOL in good position, trace Posterior capsular opacification   Vitreous Mild Vitreous syneresis, PVD, Weiss ring Mild Vitreous syneresis       Fundus Exam      Right Left   Disc mild Pallor, Sharp rim, +PPA mild Pallor, Sharp rim, 360 PPA/PPP, +cupping   C/D Ratio 0.5 0.6   Macula Flat, Blunted foveal reflex, mild RPE mottling, trace ERM Flat, Blunted foveal reflex, mild RPE  mottling, trace ERM   Vessels Vascular attenuation, Tortuous---arterioles greater than venoules, mild Copper wiring, mild AV crossing changes Vascular attenuation, Tortuous--arterioles greater than venoules, Copper wiring, macro aneurysms and beading along ST arteriole -- no heme or edema   Periphery Attached, No heme  Attached, No heme           IMAGING AND PROCEDURES  Imaging and Procedures for 02/11/2021  OCT, Retina - OU - Both Eyes       Right Eye Quality was good. Central Foveal Thickness: 231. Progression has been stable. Findings include normal foveal contour, no IRF, no SRF (focal druse nasal macula, interval improvement of vitreous opacities).   Left Eye Quality was good. Central Foveal Thickness: 222. Progression has been stable. Findings include abnormal foveal contour, no IRF, no SRF (Foveal notch nasal fovea, partial PVD).   Notes *Images captured and stored on drive  Diagnosis / Impression:  NFP, no IRF/SRF OU  OD: focal druse nasal macula, interval improvement of vitreous opacities  Clinical management:  See below  Abbreviations: NFP - Normal foveal  profile. CME - cystoid macular edema. PED - pigment epithelial detachment. IRF - intraretinal fluid. SRF - subretinal fluid. EZ - ellipsoid zone. ERM - epiretinal membrane. ORA - outer retinal atrophy. ORT - outer retinal tubulation. SRHM - subretinal hyper-reflective material. IRHM - intraretinal hyper-reflective material                 ASSESSMENT/PLAN:    ICD-10-CM   1. Hypertensive retinopathy of both eyes  H35.033   2. Essential hypertension  I10   3. Retinal edema  H35.81 OCT, Retina - OU - Both Eyes  4. Posterior vitreous detachment of right eye  H43.811   5. Pseudophakia, both eyes  Z96.1   6. Primary open angle glaucoma of both eyes, unspecified glaucoma stage  H40.1130    1,2. Hypertensive retinopathy OU  - exam shows 3+ tortuous vessels OU + OS with beading / early macroaneurysms of sup temp  arterioles  - no frank heme or surrounding edema  - FA 10.29.21 shows mild peripheral vascular nonperfusion OU, OS with mild perivascular leakage  - BCVA stable today, 20/20 OD, 20/30 OS - discussed importance of tight BP control - monitor - f/u 6 months, sooner prn -- DFE/OCT  3. No retinal edema on exam or OCT  4. PVD OD  - interval development of PVD OD on 10.29.21 visit  - OCT OD shows stable release of partial PVD from disc  - Discussed findings and prognosis  - No RT or RD on 360 peripheral exam  - Reviewed s/s of RT/RD  - Strict return precautions for any such RT/RD signs/symptoms  - f/u in 6 months -- DFE/OCT  5. Pseudophakia OU  - s/p CE/IOL OU (Dr. Herbert Deaner, OS 6.1.21, OD 9.7.21)  - IOLs in good position, doing well  - monitor  6. Glaucoma  - under the expert management of Dr. Herbert Deaner  - IOP today 15,13  - on latanoprost QHS   Ophthalmic Meds Ordered this visit:  No orders of the defined types were placed in this encounter.      Return in about 6 months (around 08/11/2021) for f/u hypertensive retinopathy, DFE, OCT.  There are no Patient Instructions on file for this visit.  This document serves as a record of services personally performed by Gardiner Sleeper, MD, PhD. It was created on their behalf by Leeann Must, [place], an ophthalmic technician. The creation of this record is the provider's dictation and/or activities during the visit.    Electronically signed by: Leeann Must, [place] 02.24.2022 4:14 PM   This document serves as a record of services personally performed by Gardiner Sleeper, MD, PhD. It was created on their behalf by San Jetty. Owens Shark, OA an ophthalmic technician. The creation of this record is the provider's dictation and/or activities during the visit.    Electronically signed by: San Jetty. Owens Shark, [place] 02.28.2022 4:14 PM  Gardiner Sleeper, M.D., Ph.D. Diseases & Surgery of the Retina and [place] 02/11/2021   I  have reviewed the above documentation for accuracy and completeness, and I agree with the above. Gardiner Sleeper, M.D., Ph.D. 02/11/21 4:14 PM    Abbreviations: M myopia (nearsighted); A astigmatism; H hyperopia (farsighted); P presbyopia; Mrx spectacle prescription;  CTL contact lenses; OD right eye; OS left eye; OU both eyes  XT exotropia; ET esotropia; PEK punctate epithelial keratitis; PEE punctate epithelial erosions; DES dry eye syndrome; MGD meibomian gland dysfunction; ATs artificial tears; PFAT's preservative free artificial  tears; [place] nuclear sclerotic cataract; PSC posterior subcapsular cataract; ERM epi-retinal membrane; PVD posterior vitreous detachment; RD retinal detachment; DM diabetes mellitus; DR diabetic retinopathy; NPDR non-proliferative diabetic retinopathy; PDR proliferative diabetic retinopathy; CSME clinically significant macular edema; DME diabetic macular edema; dbh dot blot hemorrhages; CWS cotton wool spot; POAG primary open angle glaucoma; C/D cup-to-disc ratio; HVF humphrey visual field; GVF goldmann visual field; OCT optical coherence tomography; IOP intraocular pressure; BRVO Branch retinal vein occlusion; CRVO central retinal vein occlusion; CRAO central retinal artery occlusion; BRAO branch retinal artery occlusion; RT retinal tear; SB scleral buckle; PPV pars plana vitrectomy; VH Vitreous hemorrhage; PRP panretinal laser photocoagulation; IVK intravitreal kenalog; VMT vitreomacular traction; MH Macular hole;  NVD neovascularization of the disc; NVE neovascularization elsewhere; AREDS age related eye disease study; ARMD age related macular degeneration; POAG primary open angle glaucoma; EBMD epithelial/anterior basement membrane dystrophy; ACIOL anterior chamber intraocular lens; IOL intraocular lens; PCIOL posterior chamber intraocular lens; Phaco/IOL phacoemulsification with intraocular lens placement; [place] photorefractive keratectomy; LASIK laser assisted in situ  keratomileusis; HTN hypertension; DM diabetes mellitus; COPD chronic obstructive pulmonary disease

## 2021-02-11 ENCOUNTER — Ambulatory Visit (INDEPENDENT_AMBULATORY_CARE_PROVIDER_SITE_OTHER): Payer: Medicare (Managed Care) | Admitting: Ophthalmology

## 2021-02-11 ENCOUNTER — Encounter (INDEPENDENT_AMBULATORY_CARE_PROVIDER_SITE_OTHER): Payer: Self-pay | Admitting: Ophthalmology

## 2021-02-11 ENCOUNTER — Other Ambulatory Visit: Payer: Self-pay

## 2021-02-11 DIAGNOSIS — H401130 Primary open-angle glaucoma, bilateral, stage unspecified: Secondary | ICD-10-CM

## 2021-02-11 DIAGNOSIS — H43811 Vitreous degeneration, right eye: Secondary | ICD-10-CM | POA: Diagnosis not present

## 2021-02-11 DIAGNOSIS — I10 Essential (primary) hypertension: Secondary | ICD-10-CM | POA: Diagnosis not present

## 2021-02-11 DIAGNOSIS — H35033 Hypertensive retinopathy, bilateral: Secondary | ICD-10-CM | POA: Diagnosis not present

## 2021-02-11 DIAGNOSIS — H3581 Retinal edema: Secondary | ICD-10-CM | POA: Diagnosis not present

## 2021-02-11 DIAGNOSIS — Z961 Presence of intraocular lens: Secondary | ICD-10-CM

## 2021-02-15 ENCOUNTER — Other Ambulatory Visit (HOSPITAL_COMMUNITY): Payer: Self-pay | Admitting: *Deleted

## 2021-02-18 ENCOUNTER — Encounter (HOSPITAL_COMMUNITY)
Admission: RE | Admit: 2021-02-18 | Discharge: 2021-02-18 | Disposition: A | Discharge status: Home / Self Care | Payer: Medicare (Managed Care) | Source: Ambulatory Visit | Attending: Nurse Practitioner | Admitting: Nurse Practitioner

## 2021-02-18 ENCOUNTER — Other Ambulatory Visit: Payer: Self-pay

## 2021-02-18 DIAGNOSIS — D631 Anemia in chronic kidney disease: Secondary | ICD-10-CM | POA: Diagnosis not present

## 2021-02-18 DIAGNOSIS — N189 Chronic kidney disease, unspecified: Secondary | ICD-10-CM | POA: Insufficient documentation

## 2021-02-18 LAB — PREPARE RBC (CROSSMATCH)

## 2021-02-18 MED ORDER — SODIUM CHLORIDE 0.9% IV SOLUTION: Freq: Once | INTRAVENOUS | Status: DC

## 2021-02-19 LAB — TYPE AND SCREEN
ABO/RH(D): O POS
Antibody Screen: NEGATIVE
Unit division: 0

## 2021-02-19 LAB — BPAM RBC
Blood Product Expiration Date: 202204042359
ISSUE DATE / TIME: 202203071044
Unit Type and Rh: 5100

## 2021-03-06 ENCOUNTER — Other Ambulatory Visit (HOSPITAL_COMMUNITY): Payer: Self-pay

## 2021-03-07 ENCOUNTER — Other Ambulatory Visit: Payer: Self-pay

## 2021-03-07 ENCOUNTER — Encounter (HOSPITAL_COMMUNITY)
Admission: RE | Admit: 2021-03-07 | Discharge: 2021-03-07 | Disposition: A | Discharge status: Home / Self Care | Payer: Medicare (Managed Care) | Source: Ambulatory Visit | Attending: Nurse Practitioner | Admitting: Nurse Practitioner

## 2021-03-07 DIAGNOSIS — N189 Chronic kidney disease, unspecified: Secondary | ICD-10-CM | POA: Diagnosis not present

## 2021-03-07 LAB — PREPARE RBC (CROSSMATCH)

## 2021-03-07 MED ORDER — SODIUM CHLORIDE 0.9% IV SOLUTION: Freq: Once | INTRAVENOUS | Status: DC

## 2021-03-08 LAB — TYPE AND SCREEN
ABO/RH(D): O POS
Antibody Screen: NEGATIVE
Unit division: 0

## 2021-03-08 LAB — BPAM RBC
Blood Product Expiration Date: 202204242359
ISSUE DATE / TIME: 202203240938
Unit Type and Rh: 5100

## 2021-04-01 ENCOUNTER — Other Ambulatory Visit: Payer: Self-pay

## 2021-04-01 ENCOUNTER — Non-Acute Institutional Stay (HOSPITAL_COMMUNITY)
Admission: RE | Admit: 2021-04-01 | Discharge: 2021-04-01 | Disposition: A | Discharge status: Home / Self Care | Payer: Medicare (Managed Care) | Source: Ambulatory Visit | Attending: Internal Medicine | Admitting: Internal Medicine

## 2021-04-01 DIAGNOSIS — D509 Iron deficiency anemia, unspecified: Secondary | ICD-10-CM | POA: Insufficient documentation

## 2021-04-01 DIAGNOSIS — N189 Chronic kidney disease, unspecified: Secondary | ICD-10-CM | POA: Insufficient documentation

## 2021-04-01 DIAGNOSIS — D631 Anemia in chronic kidney disease: Secondary | ICD-10-CM | POA: Diagnosis not present

## 2021-04-01 LAB — PREPARE RBC (CROSSMATCH)

## 2021-04-01 MED ORDER — SODIUM CHLORIDE 0.9 % IV SOLN
INTRAVENOUS | Status: DC | PRN
  Administered 2021-04-01: 250 mL via INTRAVENOUS

## 2021-04-01 MED ORDER — SODIUM CHLORIDE 0.9 % IV SOLN
510.0000 mg | Freq: Once | INTRAVENOUS | Status: AC
  Administered 2021-04-01: 510 mg via INTRAVENOUS
  Filled 2021-04-01: qty 510

## 2021-04-01 MED ORDER — SODIUM CHLORIDE 0.9% IV SOLUTION: Freq: Once | INTRAVENOUS | Status: AC

## 2021-04-01 NOTE — Progress Notes (Signed)
PATIENT CARE CENTER NOTE  Diagnosis: D50.9- Iron Deficiency Anemia    Provider: Cletis Athens, FNP   Procedure: 1 unit PRBC and Feraheme infusion    Note: Type and Screen drawn. Patient received Feraheme infusion followed by transfusion of 1 unit PRBC's. Patient tolerated both infusions well. Observed patient for 30 minutes following iron infusion before starting blood. No adverse reaction noted from iron infusion and vital signs wnl. Blood transfusion administered with no adverse reaction. Discharge instructions given. Patient to come back in 1 week for second Feraheme infusion. Patient alert, oriented and ambulatory with cane at discharge. Patient discharged home with daughter, [name].

## 2021-04-01 NOTE — Discharge Instructions (Signed)

## 2021-04-02 LAB — BPAM RBC
Blood Product Expiration Date: 202205122359
ISSUE DATE / TIME: 202204181206
Unit Type and Rh: 5100

## 2021-04-02 LAB — TYPE AND SCREEN
ABO/RH(D): O POS
Antibody Screen: NEGATIVE
Unit division: 0

## 2021-04-08 ENCOUNTER — Ambulatory Visit: Payer: Medicare (Managed Care) | Admitting: Cardiovascular Disease

## 2021-04-08 ENCOUNTER — Other Ambulatory Visit: Payer: Self-pay

## 2021-04-08 ENCOUNTER — Non-Acute Institutional Stay (HOSPITAL_COMMUNITY)
Admission: RE | Admit: 2021-04-08 | Discharge: 2021-04-08 | Disposition: A | Discharge status: Home / Self Care | Payer: Medicare (Managed Care) | Source: Ambulatory Visit | Attending: Internal Medicine | Admitting: Internal Medicine

## 2021-04-08 DIAGNOSIS — N189 Chronic kidney disease, unspecified: Secondary | ICD-10-CM | POA: Insufficient documentation

## 2021-04-08 DIAGNOSIS — D631 Anemia in chronic kidney disease: Secondary | ICD-10-CM | POA: Insufficient documentation

## 2021-04-08 MED ORDER — SODIUM CHLORIDE 0.9 % IV SOLN
INTRAVENOUS | Status: DC | PRN
  Administered 2021-04-08: 250 mL via INTRAVENOUS

## 2021-04-08 MED ORDER — SODIUM CHLORIDE 0.9 % IV SOLN
510.0000 mg | Freq: Once | INTRAVENOUS | Status: AC
  Administered 2021-04-08: 510 mg via INTRAVENOUS
  Filled 2021-04-08: qty 510

## 2021-04-08 NOTE — Discharge Instructions (Signed)
Ferumoxytol injection What is this medicine? FERUMOXYTOL is an iron complex. Iron is used to make healthy red blood cells, which carry oxygen and nutrients throughout the body. This medicine is used to treat iron deficiency anemia. This medicine may be used for other purposes; ask your health care provider or pharmacist if you have questions. COMMON BRAND NAME(S): Feraheme What should I tell my health care provider before I take this medicine? They need to know if you have any of these conditions:  anemia not caused by low iron levels  high levels of iron in the blood  magnetic resonance imaging (MRI) test scheduled  an unusual or allergic reaction to iron, other medicines, foods, dyes, or preservatives  pregnant or trying to get pregnant  breast-feeding How should I use this medicine? This medicine is for injection into a vein. It is given by a health care professional in a hospital or clinic setting. Talk to your pediatrician regarding the use of this medicine in children. Special care may be needed. Overdosage: If you think you have taken too much of this medicine contact a poison control center or emergency room at once. NOTE: This medicine is only for you. Do not share this medicine with others. What if I miss a dose? It is important not to miss your dose. Call your doctor or health care professional if you are unable to keep an appointment. What may interact with this medicine? This medicine may interact with the following medications:  other iron products This list may not describe all possible interactions. Give your health care provider a list of all the medicines, herbs, non-prescription drugs, or dietary supplements you use. Also tell them if you smoke, drink alcohol, or use illegal drugs. Some items may interact with your medicine. What should I watch for while using this medicine? Visit your doctor or healthcare professional regularly. Tell your doctor or healthcare  professional if your symptoms do not start to get better or if they get worse. You may need blood work done while you are taking this medicine. You may need to follow a special diet. Talk to your doctor. Foods that contain iron include: whole grains/cereals, dried fruits, beans, or peas, leafy green vegetables, and organ meats (liver, kidney). What side effects may I notice from receiving this medicine? Side effects that you should report to your doctor or health care professional as soon as possible:  allergic reactions like skin rash, itching or hives, swelling of the face, lips, or tongue  breathing problems  changes in blood pressure  feeling faint or lightheaded, falls  fever or chills  flushing, sweating, or hot feelings  swelling of the ankles or feet Side effects that usually do not require medical attention (report to your doctor or health care professional if they continue or are bothersome):  diarrhea  headache  nausea, vomiting  stomach pain This list may not describe all possible side effects. Call your doctor for medical advice about side effects. You may report side effects to FDA at 1-800-FDA-1088. Where should I keep my medicine? This drug is given in a hospital or clinic and will not be stored at home. NOTE: This sheet is a summary. It may not cover all possible information. If you have questions about this medicine, talk to your doctor, pharmacist, or health care provider.  2021 Elsevier/Gold Standard (2017-01-19 20:21:10)  

## 2021-04-08 NOTE — Progress Notes (Signed)
PATIENT CARE CENTER NOTE  Diagnosis: D50.9- Iron Deficiency Anemia    Provider: Cletis Athens, FNP   Procedure:  Feraheme infusion    Note:  Patient received second Feraheme infusion via PIV. Tolerated well with no adverse reaction. Observed patient for 30 minutes following iron infusion. Vital signs stable. Discharge instructions given. Patient alert, oriented and ambulatory with cane at discharge. Patient discharged home with daughter, [name].

## 2021-05-07 NOTE — Progress Notes (Signed)
Cardiology Office Note:    Date:  05/08/2021   ID:  [name], DOB [birth year], MRN [id number]  PCP:  [facility], [place]  Cardiologist:  Skeet Latch, MD   Referring MD: [facility]   Chief Complaint  Patient presents with  . Follow-up  HCM  History of Present Illness:    [name] is a 75 y.o. female with a hx of  hypertension, hyperlipidemia, anemia of chronic disease, chronic diastolic heart failure, CKD stage II, permanent atrial fibrillation and complete heart block status post pacemaker placement in 1998, COPD, GERD, OSA and laryngeal stenosis status post trach, and polymyalgia rheumatica.  She has a history of a GI bleed in 2012 from gastric AVMs, and was therefore not on anticoagulation for atrial fibrillation.  She had normal coronary arteries by heart cath in 08/2016. She was referred for watchman device in 6759, workup complicated by acute respiratory distress during TEE requiring intubation. She developed acute biventricular failure that eventually normalized.  She was seen in clinic on 09/23/2018 by Dr. Rayann Heman.  She was doing well at that time.  Pacemaker function was appropriate.  Since that visit she has had multiple ER visits.    She was hospitalized from 05/08/2019 through 05/11/2019 for new onset stroke with expressive aphasia.  She initially presented for shortness of breath which she had attributed to a buildup of mucus in her tracheostomy.  CT head showed possible stroke in the lower left parietal lobe.  MRI was not obtained due to pacemaker placement.  She does have a history of GI bleed in 2012.  GI was consulted and she was deemed a candidate for anticoagulation.  She was started on Eliquis 5 mg twice daily. Since that time, she had a negative work up in the ER 05/15/19 for CP and SOB. Cardiology was consulted and no further workup deemed necessary, she was discharged on 05/17/19.  I saw her in follow up 05/19/19.  At that time, she reported blood in her stool. Given her baseline anemia of chronic disease with a baseline Hb of 9, I contacted Dr. Silverio Decamp, her GI doctor. Per her instructions, repeat CBC was obtained and her Hb was stable. She last saw [name] 10/04/20. She has been frustrated by her limitations and trach in the past.   She is receiving feraheme infusions and blood transfusions monthly (for the past year or longer). Unclear etiology of anemia. She states she is still taking eliquis. Denies dyspnea, chest pains, falls, lower extremity swelling, and orthopnea. She is unable to have trach removed, told it was "too late." She follows with nephrology and they have discussed potentially needing HD in the future. She is mentally preparing for this. Overall, looks much stronger than the last time I saw her. No cardiac complaints. Hypotensive today, but she is due for a transfusion tomorrow.   Past Medical History:  Diagnosis Date  . Anemia   . Angioedema    2/2 ACE  . Arteriovenous malformation of stomach   . Arthritis   . Asthma   . AVM (arteriovenous malformation) of colon    small intestine; stomach  . Blood transfusion   . CHF (congestive heart failure) ([place])   . Complete heart block ([place])    s/p PPM 1998  . Complication of anesthesia    Difficult airway; anaphylaxis and swelling with propofol  . DDD (degenerative disc disease)   . Depression   . Diastolic heart failure   .  Fatty liver 07/26/10  . GERD (gastroesophageal reflux disease)   . GI bleed   . History of alcohol abuse Stopped Fall 2012  . History of tobacco use Quit Fall 2012  . Hx of cardiovascular stress test    a. Lexiscan Myoview (10/15):  Small inferolateral and apical defect c/w scar and poss soft tissue attenuation, no ischemia, EF 42%  . Hx of colonic polyp 08/13/10   adenomatous  . Hx of colonoscopy   . Hyperlipidemia   . Hypertension   . Hypertrophic cardiomyopathy ([place])    dx by Dr [name] 2009  . Iron  deficiency anemia   . Myocardial infarction ([place])   . Pacemaker   . Panic attacks   . Permanent atrial fibrillation ([place])   . Renal failure    baseline creatinine 1.6  . Right arm pain 01/08/2012  . RLS (restless legs syndrome)    Dx 06/2007  . Sleep apnea    no cpap     Past Surgical History:  Procedure Laterality Date  . BIOPSY  09/04/2020   Procedure: BIOPSY;  Surgeon: Mauri Pole, MD;  Location: [facility];  Service: Endoscopy;;  . CARDIAC CATHETERIZATION    . CARDIAC CATHETERIZATION N/A 08/19/2016   Procedure: Right/Left Heart Cath and Coronary Angiography;  Surgeon: Jolaine Artist, MD;  Location: [facility];  Service: Cardiovascular;  Laterality: N/A;  . COLONOSCOPY WITH PROPOFOL N/A 06/16/2017   Procedure: COLONOSCOPY WITH PROPOFOL;  Surgeon: Mauri Pole, MD;  Location: [facility];  Service: Endoscopy;  Laterality: N/A;  . COLONOSCOPY WITH PROPOFOL N/A 09/04/2020   Procedure: COLONOSCOPY WITH PROPOFOL;  Surgeon: Mauri Pole, MD;  Location: [facility];  Service: Endoscopy;  Laterality: N/A;  . DIRECT LARYNGOSCOPY N/A 09/18/2016   Procedure: DIRECT LARYNGOSCOPY, BRONCHOSCOPY, REMOVAL OF INTUBATION GRANULOMA;  Surgeon: [name], MD;  Location: [facility] OR;  Service: ENT;  Laterality: N/A;  . DIRECT LARYNGOSCOPY N/A 10/20/2016   Procedure: EXTUBATION AND FLEXIBLE LARYNGOSCOPE;  Surgeon: [name], MD;  Location: [facility];  Service: ENT;  Laterality: N/A;  . DIRECT LARYNGOSCOPY N/A 10/29/2016   Procedure: DIRECT LARYNGOSCOPY;  Surgeon: [name], MD;  Location: [facility];  Service: ENT;  Laterality: N/A;  . ESOPHAGOGASTRODUODENOSCOPY  12/23/2011   Procedure: ESOPHAGOGASTRODUODENOSCOPY (EGD);  Surgeon: [name], MD;  Location: [facility];  Service: Endoscopy;  Laterality: N/A;  . ESOPHAGOGASTRODUODENOSCOPY (EGD) WITH PROPOFOL N/A 06/16/2017   Procedure: ESOPHAGOGASTRODUODENOSCOPY (EGD) WITH PROPOFOL;  Surgeon: Mauri Pole, MD;  Location:  [facility];  Service: Endoscopy;  Laterality: N/A;  . ESOPHAGOGASTRODUODENOSCOPY (EGD) WITH PROPOFOL N/A 09/04/2020   Procedure: ESOPHAGOGASTRODUODENOSCOPY (EGD) WITH PROPOFOL;  Surgeon: Mauri Pole, MD;  [facility]: [facility];  Service: Endoscopy;  Laterality: N/A;  . EXTUBATION (ENDOTRACHEAL) IN OR N/A 07/21/2016   Procedure: EXTUBATION (ENDOTRACHEAL) IN OR;  Surgeon: [name], MD;  Location: [facility];  Service: ENT;  Laterality: N/A;  . GIVENS CAPSULE STUDY  12/23/2011   Procedure: GIVENS CAPSULE STUDY;  Surgeon: [name], MD;  Location: [facility];  Service: Endoscopy;  Laterality: N/A;  . HOT HEMOSTASIS N/A 09/04/2020   Procedure: HOT HEMOSTASIS (ARGON PLASMA COAGULATION/BICAP);  Surgeon: Mauri Pole, MD;  Location: [facility];  Service: Endoscopy;  Laterality: N/A;  . MICROLARYNGOSCOPY WITH LASER N/A 10/16/2016   Procedure: MICRODIRECTLARYNGOSCOPY WITH LASER ABLATION AND KENLOG INJECTION;  Surgeon: [name], MD;  Location: [place];  Service: ENT;  Laterality: N/A;  . [place]   [facility]  [facility], most recent gen change by [name] 4/12  . POLYPECTOMY  09/04/2020   Procedure: POLYPECTOMY;  Surgeon: Mauri Pole, MD;  Location: [facility];  Service: Endoscopy;;  . TRACHEOSTOMY TUBE PLACEMENT N/A 10/29/2016   Procedure: TRACHEOSTOMY;  Surgeon: [name], MD;  Location: [facility];  Service: ENT;  Laterality: N/A;  . TUBAL LIGATION  04/01/2000    Current Medications: Current Meds  Medication Sig  . acetaminophen (TYLENOL) 650 MG CR tablet Take 650 mg by mouth every 8 (eight) hours as needed for pain.   [name] albuterol (PROVENTIL HFA;VENTOLIN HFA) 108 (90 Base) MCG/ACT inhaler Inhale 1-2 puffs into the lungs every 6 (six) hours as needed for wheezing or shortness of breath.  . allopurinol (ZYLOPRIM) 100 MG tablet TAKE 2 TABLETS(200 MG) BY MOUTH DAILY  . ALPRAZolam (XANAX) 0.5 MG tablet Take 1 tablet (0.5 mg total) by mouth at bedtime as needed for anxiety.  [name]  apixaban (ELIQUIS) 5 MG TABS tablet Take 1 tablet (5 mg total) by mouth 2 (two) times daily.  . brimonidine (ALPHAGAN) 0.2 % ophthalmic solution Place 1 drop into the right eye 2 (two) times daily.  . cetirizine (ZYRTEC) 10 MG tablet Take 10 mg by mouth daily as needed for allergies.   [name] Epoetin Alfa-epbx (RETACRIT IJ) Inject 1 Dose as directed once a week.   . ferrous sulfate 325 (65 FE) MG tablet TAKE 1 TABLET BY MOUTH TWICE DAILY WITH MEALS TO KEEP BLOOD COUNT UP  . furosemide (LASIX) 20 MG tablet Take 1 tablet (20 mg total) by mouth daily. Daily as needed for weight gain of 2lbs in a day or 5lbs in a week  . ipratropium-albuterol (DUONEB) 0.5-2.5 (3) MG/3ML SOLN USE 3 ML VIA NEBULIZER EVERY 6 HOURS  . latanoprost (XALATAN) 0.005 % ophthalmic solution Place 1 drop into both eyes at bedtime.  . metoprolol tartrate (LOPRESSOR) 25 MG tablet Take 1 tablet (25 mg total) by mouth 2 (two) times daily.  . mirtazapine (REMERON) 7.5 MG tablet Take 7.5 mg by mouth at bedtime.  . nitroGLYCERIN (NITROSTAT) 0.4 MG SL tablet Place 1 tablet (0.4 mg total) under the tongue every 5 (five) minutes as needed for chest pain.  [name] Olopatadine HCl 0.2 % SOLN Place 1-2 drops into both eyes daily as needed (itching).   [name] oxymetazoline (AFRIN NASAL SPRAY) 0.05 % nasal spray Place 1 spray into both nostrils 2 (two) times daily.  . potassium chloride (K-DUR) 10 MEQ tablet Take 1 tablet (10 mEq total) by mouth daily.  . rosuvastatin (CRESTOR) 20 MG tablet Take 1 tablet (20 mg total) by mouth at bedtime.  . sodium chloride (OCEAN) 0.65 % SOLN nasal spray Place 1 spray into both nostrils daily as needed for congestion.   [name] STIMULANT LAXATIVE 8.6-50 MG tablet Take 2 tablets by mouth at bedtime as needed for mild constipation.   . triamcinolone cream (KENALOG) 0.1 % Apply 1 application topically 2 (two) times daily.     Allergies:   Ace inhibitors, Other, and Propofol   Social History   Socioeconomic History  . Marital  status: Divorced    Spouse name: Not on file  . Number of children: 4  . Years of education: 9  . Highest education level: Not on file  Occupational History  . Occupation: disabled    Fish farm manager: UNEMPLOYED  . Occupation: previously- Development worker, community  Tobacco Use  . Smoking status: Former Smoker    Packs/day: 0.10    Years: 50.00  Pack years: 5.00    Types: Cigarettes    Quit date: 08/16/2011    Years since quitting: 9.7  . Smokeless tobacco: Never Used  . Tobacco comment: quit 2012  Vaping Use  . Vaping Use: Never used  Substance and Sexual Activity  . Alcohol use: No    Comment: quit 08/16/2011  . Drug use: No  . Sexual activity: Never  Other Topics Concern  . Not on file  Social History Narrative   On disability for heart failure/pacemaker.     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    4 grown children,  8 grandchildren.     Drinks alcohol a few times a week socially. At one time, the most she reports drinking is about 3 mixed drinks.   Lives with daughter [name] and son. Will be moving 04/2011 to different house because concerned children are taking advantage of her financial situation.    No illicit drugs.   Smokes few cigarettes daily, especially when stressed. Started smoking @ age 54. Quit January 2012 2/2 health but started smoking again 02/2011.       Health Care POA:    Emergency Contact: daughter, Verdene Lennert [phone]   End of Life Plan: gave pt AD info pamphlet   Who lives with you: no one- lives in section 8 housing   Any pets: none   Diet: Pt has a varied diet of protein, starch and vegetables   Exercise: Pt has no regular exercise routine.   Seatbelts: Pt reports wearing seatbelt when in vehicles.    Hobbies: dancing   Social Determinants of Health   Financial Resource Strain: Not on file  Food Insecurity: Not on file  Transportation Needs: Not on file  Physical Activity: Not on file  Stress: Not on file  Social Connections: Not on file      Family History: The patient's family history includes Alcohol abuse in her brother and father; Aneurysm in her mother; CVA in her mother; Cancer in her brother; Colon cancer in her brother; Diabetes in her brother and sister; Heart attack in her brother, brother, and father; Heart disease in her daughter, mother, and sister; Hypertension in her brother, father, mother, and sister; Stroke in her father and mother. There is no history of Esophageal cancer, Stomach cancer, or Rectal cancer.  ROS:   Please see the history of present illness.     All other systems reviewed and are negative.  EKGs/Labs/Other Studies Reviewed:    The following studies were reviewed today:  Echo 2020 1. The left ventricle has hyperdynamic systolic function, with an  ejection fraction of >65%. The cavity size was normal. There is severely  increased left ventricular wall thickness. Findings are consistent with  hypertrophic cardiomyopathy. Left  ventricular diastolic Doppler parameters are consistent with  pseudonormalization.  2. The right ventricle has normal systolic function. The cavity was  normal. There is no increase in right ventricular wall thickness.  3. Left atrial size was moderately dilated.  4. Right atrial size was moderately dilated.  5. Mitral valve regurgitation is moderate by color flow Doppler.  6. The aortic valve is tricuspid. Mild thickening of the aortic valve.  Mild calcification of the aortic valve.   EKG:  EKG is not ordered today.    Recent Labs: 06/14/2020: Platelets 410.0 10/18/2020: ALT 15; BUN 30; Creatinine, Ser 1.77; Potassium 3.9; Sodium 142 01/07/2021: Hemoglobin 8.1  Recent Lipid Panel    Component Value Date/Time   CHOL 151  10/18/2020 1102   TRIG 114 10/18/2020 1102   HDL 55 10/18/2020 1102   CHOLHDL 2.7 10/18/2020 1102   CHOLHDL 2.3 05/09/2019 0402   VLDL 20 05/09/2019 0402   LDLCALC 76 10/18/2020 1102   LDLDIRECT 161.8 02/21/2013 1416    Physical  Exam:    VS:  BP 90/60   Pulse 70   Ht 5\' 5"  (1.651 m)   Wt 123 lb 6.4 oz (56 kg)   SpO2 99%   BMI 20.53 kg/m     Wt Readings from Last 3 Encounters:  05/08/21 123 lb 6.4 oz (56 kg)  02/18/21 126 lb (57.2 kg)  12/03/20 123 lb 12.8 oz (56.2 kg)     GEN: elderly female in NAD HEENT: Normal NECK: No JVD; No carotid bruits LYMPHATICS: No lymphadenopathy CARDIAC: RRR RESPIRATORY:  Clear to auscultation without rales, wheezing or rhonchi  ABDOMEN: Soft, non-tender, non-distended MUSCULOSKELETAL:  No edema; No deformity  SKIN: Warm and dry NEUROLOGIC:  Alert and oriented x 3 PSYCHIATRIC:  Normal affect   ASSESSMENT:    1. Hypertrophic cardiomyopathy ([place])   2. Essential hypertension   3. Permanent atrial fibrillation ([place])   4. Chronic diastolic heart failure ([place])   5. Complete heart block ([place])   6. History of gastrointestinal bleeding   7. Chronic anticoagulation   8. Stage 3b chronic kidney disease ([facility])    PLAN:    In order of problems listed above:  Hypertrophic cardiomyopathy Hypertension - hypotensive today, on BB - asymptomatic - no medication changes - due for a transfusion tomorrow, likely will improve pressure   Chronic atrial fibrillation CHB with PPM in place Chronic anticoagulation Hx of GI bleed - follows with Dr. Rayann Heman - doing well on eliquis following stroke - not a candidate for watchman due to complications on screening TEE - This patients CHA2DS2-VASc Score and unadjusted Ischemic Stroke Rate (% per year) is equal to 9.7 % stroke rate/year from a score of 6 (CHF, HTN, stroke, age, female)   Chronic diastolic heart failure - maintained on lasix and metoprolol - PRN lasix 20 mg   CKD III - follows with nephrology - has been discussing possible HD - sCr in Jan 2.9   Follow up with [name] in 4-5 months.   Medication Adjustments/Labs and Tests Ordered: Current medicines are reviewed at length with the patient today.  Concerns  regarding medicines are outlined above.  No orders of the defined types were placed in this encounter.  No orders of the defined types were placed in this encounter.   Signed, Ledora Bottcher, [place]  05/08/2021 3:37 PM    [facility]

## 2021-05-08 ENCOUNTER — Other Ambulatory Visit: Payer: Self-pay

## 2021-05-08 ENCOUNTER — Encounter: Payer: Self-pay | Admitting: Physician Assistant

## 2021-05-08 ENCOUNTER — Ambulatory Visit (INDEPENDENT_AMBULATORY_CARE_PROVIDER_SITE_OTHER): Payer: Medicare (Managed Care) | Admitting: Physician Assistant

## 2021-05-08 VITALS — BP 90/60 | HR 70 | Ht 65.0 in | Wt 123.4 lb

## 2021-05-08 DIAGNOSIS — I10 Essential (primary) hypertension: Secondary | ICD-10-CM

## 2021-05-08 DIAGNOSIS — I422 Other hypertrophic cardiomyopathy: Secondary | ICD-10-CM

## 2021-05-08 DIAGNOSIS — I5032 Chronic diastolic (congestive) heart failure: Secondary | ICD-10-CM

## 2021-05-08 DIAGNOSIS — Z8719 Personal history of other diseases of the digestive system: Secondary | ICD-10-CM

## 2021-05-08 DIAGNOSIS — Z7901 Long term (current) use of anticoagulants: Secondary | ICD-10-CM

## 2021-05-08 DIAGNOSIS — I442 Atrioventricular block, complete: Secondary | ICD-10-CM

## 2021-05-08 DIAGNOSIS — N1832 Chronic kidney disease, stage 3b: Secondary | ICD-10-CM

## 2021-05-08 DIAGNOSIS — I4821 Permanent atrial fibrillation: Secondary | ICD-10-CM | POA: Diagnosis not present

## 2021-05-08 NOTE — Patient Instructions (Signed)
Medication Instructions:  The current medical regimen is effective;  continue present plan and medications as directed. Please refer to the Current Medication list given to you today.  *If you need a refill on your cardiac medications before your next appointment, please call your pharmacy*  Lab Work:   Testing/Procedures:  NONE    NONE   Follow-Up: Your next appointment:  SEPTEMBER 20,2022 at 94PM In Person with Skeet Latch, MD or [name] PA-C   At [facility], you and your health needs are our priority.  As part of our continuing mission to provide you with exceptional heart care, we have created designated Provider Care Teams.  These Care Teams include your primary Cardiologist (physician) and Advanced Practice Providers (APPs -  Physician Assistants and Nurse Practitioners) who all work together to provide you with the care you need, when you need it.  We recommend signing up for the patient portal called "MyChart".  Sign up information is provided on this After Visit Summary.  MyChart is used to connect with patients for Virtual Visits (Telemedicine).  Patients are able to view lab/test results, encounter notes, upcoming appointments, etc.  Non-urgent messages can be sent to your provider as well.   To learn more about what you can do with MyChart, go to [email or web address].

## 2021-05-09 ENCOUNTER — Non-Acute Institutional Stay (HOSPITAL_COMMUNITY)
Admission: RE | Admit: 2021-05-09 | Discharge: 2021-05-09 | Disposition: A | Discharge status: Home / Self Care | Payer: Medicare (Managed Care) | Source: Ambulatory Visit | Attending: Internal Medicine | Admitting: Internal Medicine

## 2021-05-09 DIAGNOSIS — N189 Chronic kidney disease, unspecified: Secondary | ICD-10-CM | POA: Insufficient documentation

## 2021-05-09 DIAGNOSIS — D509 Iron deficiency anemia, unspecified: Secondary | ICD-10-CM | POA: Diagnosis not present

## 2021-05-09 DIAGNOSIS — D631 Anemia in chronic kidney disease: Secondary | ICD-10-CM | POA: Diagnosis not present

## 2021-05-09 LAB — PREPARE RBC (CROSSMATCH)

## 2021-05-09 MED ORDER — SODIUM CHLORIDE 0.9% IV SOLUTION: Freq: Once | INTRAVENOUS | Status: AC

## 2021-05-09 NOTE — Progress Notes (Signed)
PATIENT CARE CENTER NOTE  Diagnosis: D50.9- Iron Deficiency Anemia    Provider: Cletis Athens, FNP   Procedure: 1 unit PRBC   Note: Type and Screen drawn. Patient receiced transfusion of 1 unit PRBC's. Patient tolerated transfusion well with no adverse reaction. Discharge instructions given. Patient alert, oriented and ambulatory with cane at discharge. Patient discharged home with daughter, [name].

## 2021-05-09 NOTE — Discharge Instructions (Signed)

## 2021-05-10 LAB — BPAM RBC
Blood Product Expiration Date: 202206232359
ISSUE DATE / TIME: 202205261035
Unit Type and Rh: 5100

## 2021-05-10 LAB — TYPE AND SCREEN
ABO/RH(D): O POS
Antibody Screen: NEGATIVE
Unit division: 0

## 2021-05-21 ENCOUNTER — Other Ambulatory Visit: Payer: Self-pay

## 2021-05-21 ENCOUNTER — Non-Acute Institutional Stay (HOSPITAL_COMMUNITY)
Admission: RE | Admit: 2021-05-21 | Discharge: 2021-05-21 | Disposition: A | Discharge status: Home / Self Care | Payer: Medicare (Managed Care) | Source: Ambulatory Visit | Attending: Internal Medicine | Admitting: Internal Medicine

## 2021-05-21 DIAGNOSIS — D509 Iron deficiency anemia, unspecified: Secondary | ICD-10-CM | POA: Insufficient documentation

## 2021-05-21 DIAGNOSIS — N189 Chronic kidney disease, unspecified: Secondary | ICD-10-CM | POA: Diagnosis not present

## 2021-05-21 DIAGNOSIS — D631 Anemia in chronic kidney disease: Secondary | ICD-10-CM | POA: Insufficient documentation

## 2021-05-21 LAB — PREPARE RBC (CROSSMATCH)

## 2021-05-21 MED ORDER — SODIUM CHLORIDE 0.9% IV SOLUTION: Freq: Once | INTRAVENOUS | Status: AC

## 2021-05-21 NOTE — Discharge Instructions (Signed)

## 2021-05-21 NOTE — Progress Notes (Signed)
PATIENT CARE CENTER NOTE  Diagnosis:D50.9- Iron Deficiency Anemia   Provider:Brown, [place], FNP   Procedure:1 unit PRBC   Note:Type and Screen drawn. Patient received transfusion of 1 unit PRBC's via PIV. Patient tolerated transfusion well with no adverse reaction. Discharge instructions given.Patient alert, oriented and ambulatory with cane at discharge. Transported home with [facility] transportation service.

## 2021-05-22 LAB — TYPE AND SCREEN
ABO/RH(D): O POS
Antibody Screen: NEGATIVE
Unit division: 0

## 2021-05-22 LAB — BPAM RBC
Blood Product Expiration Date: 202206142359
ISSUE DATE / TIME: 202206071111
Unit Type and Rh: 5100

## 2021-06-12 ENCOUNTER — Non-Acute Institutional Stay (HOSPITAL_COMMUNITY)
Admission: RE | Admit: 2021-06-12 | Discharge: 2021-06-12 | Disposition: A | Discharge status: Home / Self Care | Payer: Medicare (Managed Care) | Source: Ambulatory Visit | Attending: Internal Medicine | Admitting: Internal Medicine

## 2021-06-12 ENCOUNTER — Other Ambulatory Visit: Payer: Self-pay

## 2021-06-12 DIAGNOSIS — D631 Anemia in chronic kidney disease: Secondary | ICD-10-CM | POA: Insufficient documentation

## 2021-06-12 DIAGNOSIS — N189 Chronic kidney disease, unspecified: Secondary | ICD-10-CM | POA: Diagnosis present

## 2021-06-12 LAB — PREPARE RBC (CROSSMATCH)

## 2021-06-12 MED ORDER — SODIUM CHLORIDE 0.9 % IV SOLN
INTRAVENOUS | Status: DC | PRN
  Administered 2021-06-12: 250 mL via INTRAVENOUS

## 2021-06-12 MED ORDER — SODIUM CHLORIDE 0.9 % IV SOLN
510.0000 mg | Freq: Once | INTRAVENOUS | Status: AC
  Administered 2021-06-12: 510 mg via INTRAVENOUS
  Filled 2021-06-12: qty 510

## 2021-06-12 MED ORDER — SODIUM CHLORIDE 0.9% IV SOLUTION: Freq: Once | INTRAVENOUS | Status: AC

## 2021-06-12 NOTE — Progress Notes (Signed)
PATIENT CARE CENTER NOTE   Diagnosis: D50.9- Iron Deficiency Anemia      Provider: Cletis Athens, FNP     Procedure: 1 unit PRBC and Feraheme infusion      Note: Type and Screen drawn. Patient received Feraheme infusion followed by transfusion of 1 unit PRBC's. Patient tolerated both infusions well. Observed patient for 30 minutes following iron infusion before starting blood. No adverse reaction noted from iron infusion and vital signs wnl. Blood transfusion administered with no adverse reaction. Discharge instructions given. Patient to come back in 1 week for second Feraheme infusion. Patient alert, oriented and ambulatory with cane at discharge. Patient discharged home with daughter, [name].

## 2021-06-13 LAB — TYPE AND SCREEN
ABO/RH(D): O POS
Antibody Screen: NEGATIVE
Unit division: 0

## 2021-06-13 LAB — BPAM RBC
Blood Product Expiration Date: 202207292359
ISSUE DATE / TIME: 202206291057
Unit Type and Rh: 5100

## 2021-06-19 ENCOUNTER — Other Ambulatory Visit: Payer: Self-pay

## 2021-06-19 ENCOUNTER — Non-Acute Institutional Stay (HOSPITAL_COMMUNITY)
Admission: RE | Admit: 2021-06-19 | Discharge: 2021-06-19 | Disposition: A | Discharge status: Home / Self Care | Payer: Medicare (Managed Care) | Source: Ambulatory Visit | Attending: Internal Medicine | Admitting: Internal Medicine

## 2021-06-19 DIAGNOSIS — D509 Iron deficiency anemia, unspecified: Secondary | ICD-10-CM | POA: Insufficient documentation

## 2021-06-19 MED ORDER — SODIUM CHLORIDE 0.9 % IV SOLN
510.0000 mg | Freq: Once | INTRAVENOUS | Status: AC
  Administered 2021-06-19: 510 mg via INTRAVENOUS
  Filled 2021-06-19: qty 510

## 2021-06-19 MED ORDER — SODIUM CHLORIDE 0.9 % IV SOLN
INTRAVENOUS | Status: DC | PRN
  Administered 2021-06-19: 250 mL via INTRAVENOUS

## 2021-06-19 NOTE — Progress Notes (Signed)
PATIENT CARE CENTER NOTE    Diagnosis: D50.9- Iron Deficiency Anemia      Provider: Cletis Athens, FNP     Procedure:  Feraheme infusion      Note:  Patient received second Feraheme infusion via PIV. Tolerated well with no adverse reaction. Observed patient for 30 minutes following iron infusion. Vital signs stable. Discharge instructions given. Patient alert, oriented and ambulatory with cane at discharge. Patient discharged home with daughter, [name].

## 2021-06-20 ENCOUNTER — Telehealth: Payer: Self-pay

## 2021-06-20 NOTE — Telephone Encounter (Signed)
Returned call to daughter. She is requesting a Wednesday or Friday appt. Appt scheduled for  7/15 at 1 pm, ask that a family member come to appt and arrive 20 mins early. Ask her to call the office back if this appt does not work.

## 2021-06-20 NOTE — Telephone Encounter (Signed)
Called and left a message asking daughter to call the office back to schedule appt for her Mom. Daughters phone # is the same as Ms. Ohair-Polk. Dr. Alvy Bimler would like to see her next week for 45 minutes.  Called Cletis Athens, NP and requested copies of lab results. Given fax #, she will faxed labs.

## 2021-06-28 ENCOUNTER — Encounter: Payer: Self-pay | Admitting: Hematology and Oncology

## 2021-06-28 ENCOUNTER — Other Ambulatory Visit: Payer: Self-pay

## 2021-06-28 ENCOUNTER — Inpatient Hospital Stay: Payer: Medicare (Managed Care) | Attending: Hematology and Oncology | Admitting: Hematology and Oncology

## 2021-06-28 DIAGNOSIS — Z7901 Long term (current) use of anticoagulants: Secondary | ICD-10-CM | POA: Diagnosis not present

## 2021-06-28 DIAGNOSIS — K5520 Angiodysplasia of colon without hemorrhage: Secondary | ICD-10-CM | POA: Diagnosis not present

## 2021-06-28 DIAGNOSIS — Z79899 Other long term (current) drug therapy: Secondary | ICD-10-CM | POA: Diagnosis not present

## 2021-06-28 DIAGNOSIS — I509 Heart failure, unspecified: Secondary | ICD-10-CM | POA: Insufficient documentation

## 2021-06-28 DIAGNOSIS — I482 Chronic atrial fibrillation, unspecified: Secondary | ICD-10-CM | POA: Insufficient documentation

## 2021-06-28 DIAGNOSIS — I442 Atrioventricular block, complete: Secondary | ICD-10-CM | POA: Diagnosis not present

## 2021-06-28 DIAGNOSIS — D649 Anemia, unspecified: Secondary | ICD-10-CM | POA: Diagnosis present

## 2021-06-28 DIAGNOSIS — I63412 Cerebral infarction due to embolism of left middle cerebral artery: Secondary | ICD-10-CM | POA: Diagnosis not present

## 2021-06-28 DIAGNOSIS — N183 Chronic kidney disease, stage 3 unspecified: Secondary | ICD-10-CM | POA: Insufficient documentation

## 2021-06-28 DIAGNOSIS — K5521 Angiodysplasia of colon with hemorrhage: Secondary | ICD-10-CM | POA: Insufficient documentation

## 2021-06-28 DIAGNOSIS — D500 Iron deficiency anemia secondary to blood loss (chronic): Secondary | ICD-10-CM | POA: Diagnosis not present

## 2021-06-28 DIAGNOSIS — Z8673 Personal history of transient ischemic attack (TIA), and cerebral infarction without residual deficits: Secondary | ICD-10-CM | POA: Diagnosis not present

## 2021-06-28 NOTE — Assessment & Plan Note (Addendum)
I will make an attempt to refer her back to Dr. Silverio Decamp from gastroenterology service to see if we can arrange for another EGD evaluation and possible argon treatment if she has signs of bleeding

## 2021-06-28 NOTE — Assessment & Plan Note (Signed)
The most likely cause of her ongoing need for blood transfusion and IV iron is chronic GI bleed secondary to chronic arteriovenous malformation From my previous visit, I have discussed with the patient and family consideration to stop anticoagulation therapy The blood transfusion support and IV iron is not keeping up with her blood loss I spoke with her care management team from [facility], [place] as well as reviewed the case with Dr. Carolin Sicks, her nephrologist I reviewed the danger of life-threatening bleeding I reviewed the risk of development alloantibodies causing high risk of transfusion reactions and difficulties obtaining blood products in the future in the setting of chronic GI bleed

## 2021-06-28 NOTE — Assessment & Plan Note (Signed)
The patient is on chronic anticoagulation therapy due to high risk of thromboembolic stroke However, this has to be balanced with the ongoing risk of GI bleed At the conclusion of our visit, it does not appear that the patient or family is willing to discontinue apixaban I recommend appointment to see cardiologist to discuss this further

## 2021-06-28 NOTE — Assessment & Plan Note (Signed)
She has been receiving erythropoietin stimulating agent under the care of Dr. Carolin Sicks I will defer to him for management

## 2021-06-28 NOTE — Progress Notes (Signed)
[name]  ASSESSMENT & PLAN:  AVM (arteriovenous malformation) of small bowel, acquired I will make an attempt to refer her back to Dr. Silverio Decamp from gastroenterology service to see if we can arrange for another EGD evaluation and possible argon treatment if she has signs of bleeding  Cerebral embolism with cerebral infarction The patient is on chronic anticoagulation therapy due to high risk of thromboembolic stroke However, this has to be balanced with the ongoing risk of GI bleed At the conclusion of our visit, it does not appear that the patient or family is willing to discontinue apixaban I recommend appointment to see cardiologist to discuss this further  CKD (chronic kidney disease) stage 3, GFR 30-59 ml/min ([place]) She has been receiving erythropoietin stimulating agent under the care of Dr. Carolin [name] I will defer to him for management  Anemia The most likely cause of her ongoing need for blood transfusion and IV iron is chronic GI bleed secondary to chronic arteriovenous malformation From my previous visit, I have discussed with the patient and family consideration to stop anticoagulation therapy The blood transfusion support and IV iron is not keeping up with her blood loss I spoke with her care management team from [facility], [place] as well as reviewed the case with Dr. Carolin [name], her nephrologist I reviewed the danger of life-threatening bleeding I reviewed the risk of development alloantibodies causing high risk of transfusion reactions and difficulties obtaining blood products in the future in the setting of chronic GI bleed  No orders of the defined types were placed in this encounter.   The total time spent in the appointment was 40 minutes encounter with patients including review of chart and various tests results, discussions about plan of care and coordination of care plan   All questions  were answered. The patient knows to call the clinic with any problems, questions or concerns. No barriers to learning was detected.    [name] Lark, MD 7/15/20221:48 PM  INTERVAL HISTORY: [name] 75 y.o. female returns for recurrent, chronic GI bleed I saw her a year ago At the time of evaluation, I recommend patient and family to consider discontinuation of apixaban The patient is known to have arteriovenous malformation of the duodenum, noted on prior upper endoscopy evaluation in September 2021 Unfortunately, over the past year, she continues taking anticoagulation therapy I have reviewed the case with her care management team, [name] as well as the nephrology team From talking to the patient and her care team members, the patient and family has not been willing to discontinue treatment for fear of having another embolic stroke  SUMMARY OF HEMATOLOGIC HISTORY: [name]  is here because of chronic anemia I spoke with the daughter and reviewed her records extensively Her daughter, [name] is present I also spoke briefly with nurse practitioner from [facility] The patient is known to have arteriovenous malformation She has chronic atrial fibrillation, complete heart block and congestive heart failure, with chronic kidney disease She right recurrent hospitalization She has history of stroke The patient is a poor historian but she is able to talk okay today  Over the past few years, she has received intermittent doses of intravenous iron, erythropoietin stimulating agents as well as blood transfusion Initially, her nephrologist is managing her treatment but subsequently, the patient was told she needs to see a hematologist Currently, she is on the [facility]  She denies recent chest pain on exertion  but has shortness of breath on minimal exertion Her daughter noted intermittent bright red blood per rectum after bowel movement I verified various AVS paperwork  that her daughter brought in confirm the patient is taking Eliquis Her last EGD and colonoscopy was in 2018 She denies any pica and eats a variety of diet. She has chronic constipation  I have reviewed the past medical history, past surgical history, social history and family history with the patient and they are unchanged from previous note.  ALLERGIES:  is allergic to ace inhibitors, other, and propofol.  MEDICATIONS:  Current Outpatient Medications  Medication Sig Dispense Refill   acetaminophen (TYLENOL) 650 MG CR tablet Take 650 mg by mouth every 8 (eight) hours as needed for pain.      albuterol (PROVENTIL HFA;VENTOLIN HFA) 108 (90 Base) MCG/ACT inhaler Inhale 1-2 puffs into the lungs every 6 (six) hours as needed for wheezing or shortness of breath. 1 Inhaler 6   allopurinol (ZYLOPRIM) 100 MG tablet TAKE 2 TABLETS(200 MG) BY MOUTH DAILY 180 tablet 3   ALPRAZolam (XANAX) 0.5 MG tablet Take 1 tablet (0.5 mg total) by mouth at bedtime as needed for anxiety. 30 tablet 0   apixaban (ELIQUIS) 5 MG TABS tablet Take 1 tablet (5 mg total) by mouth 2 (two) times daily. 180 tablet 1   cetirizine (ZYRTEC) 10 MG tablet Take 10 mg by mouth daily as needed for allergies.      Epoetin Alfa-epbx (RETACRIT IJ) Inject 1 Dose as directed once a week.      ferrous sulfate 325 (65 FE) MG tablet TAKE 1 TABLET BY MOUTH TWICE DAILY WITH MEALS TO KEEP BLOOD COUNT UP 60 tablet 11   furosemide (LASIX) 20 MG tablet Take 1 tablet (20 mg total) by mouth daily. Daily as needed for weight gain of 2lbs in a day or 5lbs in a week 30 tablet 6   ipratropium-albuterol (DUONEB) 0.5-2.5 (3) MG/3ML SOLN USE 3 ML VIA NEBULIZER EVERY 6 HOURS 360 mL 0   latanoprost (XALATAN) 0.005 % ophthalmic solution Place 1 drop into both eyes at bedtime.     metoprolol tartrate (LOPRESSOR) 25 MG tablet Take 1 tablet (25 mg total) by mouth 2 (two) times daily. 60 tablet 5   mirtazapine (REMERON) 7.5 MG tablet Take 7.5 mg by mouth at bedtime.      nitroGLYCERIN (NITROSTAT) 0.4 MG SL tablet Place 1 tablet (0.4 mg total) under the tongue every 5 (five) minutes as needed for chest pain. 25 tablet 1   Olopatadine HCl 0.2 % SOLN Place 1-2 drops into both eyes daily as needed (itching).   1   oxymetazoline (AFRIN NASAL SPRAY) 0.05 % nasal spray Place 1 spray into both nostrils 2 (two) times daily. 30 mL 0   potassium chloride (K-DUR) 10 MEQ tablet Take 1 tablet (10 mEq total) by mouth daily. 90 tablet 0   rosuvastatin (CRESTOR) 20 MG tablet Take 1 tablet (20 mg total) by mouth at bedtime. 90 tablet 3   sodium chloride (OCEAN) 0.65 % SOLN nasal spray Place 1 spray into both nostrils daily as needed for congestion.      STIMULANT LAXATIVE 8.6-50 MG tablet Take 2 tablets by mouth at bedtime as needed for mild constipation.      No current facility-administered medications for this visit.     REVIEW OF SYSTEMS:   Constitutional: Denies fevers, chills or night sweats Eyes: Denies blurriness of vision Ears, nose, mouth, throat, and face: Denies mucositis or sore  throat Respiratory: Denies cough, dyspnea or wheezes Cardiovascular: Denies palpitation, chest discomfort or lower extremity swelling Gastrointestinal:  Denies nausea, heartburn or change in bowel habits Skin: Denies abnormal skin rashes Lymphatics: Denies new lymphadenopathy or easy bruising Neurological:Denies numbness, tingling or new weaknesses Behavioral/Psych: Mood is stable, no new changes  All other systems were reviewed with the patient and are negative.  PHYSICAL EXAMINATION: ECOG PERFORMANCE STATUS: 1 - Symptomatic but completely ambulatory  Vitals:   06/28/21 1252  BP: 99/62  Pulse: 70  Resp: 17  Temp: 97.9 F (36.6 C)  SpO2: 100%   Filed Weights   06/28/21 1252  Weight: 124 lb 6.4 oz (56.4 kg)    GENERAL:alert, no distress and comfortable NEURO: alert but patient exhibit poor understanding  LABORATORY DATA:  I have reviewed the data as listed      Component Value Date/Time   NA 142 10/18/2020 1102   K 3.9 10/18/2020 1102   CL 100 10/18/2020 1102   CO2 27 10/18/2020 1102   GLUCOSE 108 (H) 10/18/2020 1102   GLUCOSE 95 04/23/2020 0957   BUN 30 (H) 10/18/2020 1102   CREATININE 1.77 (H) 10/18/2020 1102   CREATININE 1.20 (H) 11/18/2016 1233   CALCIUM 10.5 (H) 10/18/2020 1102   CALCIUM 9.9 07/28/2019 1311   PROT 7.0 10/18/2020 1102   ALBUMIN 3.8 10/18/2020 1102   AST 21 10/18/2020 1102   ALT 15 10/18/2020 1102   ALKPHOS 62 10/18/2020 1102   BILITOT 0.4 10/18/2020 1102   GFRNONAA 28 (L) 10/18/2020 1102   GFRNONAA 45 (L) 11/18/2016 1233   GFRAA 32 (L) 10/18/2020 1102   GFRAA 52 (L) 11/18/2016 1233    No results found for: SPEP, UPEP  Lab Results  Component Value Date   WBC 8.6 06/14/2020   NEUTROABS 6.3 06/14/2020   HGB 8.1 (L) 01/07/2021   HCT 27.9 (L) 01/07/2021   MCV 83.6 06/14/2020   PLT 410.0 (H) 06/14/2020      Chemistry      Component Value Date/Time   NA 142 10/18/2020 1102   K 3.9 10/18/2020 1102   CL 100 10/18/2020 1102   CO2 27 10/18/2020 1102   BUN 30 (H) 10/18/2020 1102   CREATININE 1.77 (H) 10/18/2020 1102   CREATININE 1.20 (H) 11/18/2016 1233   GLU 96 07/28/2016 0000      Component Value Date/Time   CALCIUM 10.5 (H) 10/18/2020 1102   CALCIUM 9.9 07/28/2019 1311   ALKPHOS 62 10/18/2020 1102   AST 21 10/18/2020 1102   ALT 15 10/18/2020 1102   BILITOT 0.4 10/18/2020 1102

## 2021-07-01 ENCOUNTER — Telehealth: Payer: Self-pay | Admitting: Cardiovascular Disease

## 2021-07-01 NOTE — Telephone Encounter (Signed)
Per oncology note on 7/15: "Anemia -The most likely cause of her ongoing need for blood transfusion and IV iron is chronic GI bleed secondary to chronic arteriovenous malformation. From my previous visit, I have discussed with the patient and family consideration to stop anticoagulation therapy. The blood transfusion support and IV iron is not keeping up with her blood loss."  Pt takes Eliquis for afib with CHADS2VASc score of 8 (age x2, sex, CHF, HTN, CVA, CAD noted on CT abdomen), also has pre-DM. She would be at very high risk of stroke off of anticoagulation. Eliquis carries lowest risk of bleeding compared to other DOACs or warfarin, so changing her to another anticoagulant would not be very helpful.  Complicated pt with elevated stroke risk as well as chronic GI bleeding - cardiologist will need to weigh in on appropriate course of therapy.

## 2021-07-01 NOTE — Telephone Encounter (Signed)
Pt c/o medication issue:  1. Name of Medication: apixaban (ELIQUIS) 5 MG TABS tablet  2. How are you currently taking this medication (dosage and times per day)? As directed   3. Are you having a reaction (difficulty breathing--STAT)?   4. What is your medication issue? Patient is anemic.Eliquis is exacerbating condition. Per NP patient has had to have transfusions 2x a month.    Hematologist requesting patient stop taking this and start an alternate medication. Per NP the patient's Hematologist is in the [facility] system. Please read last office visit note for more information

## 2021-07-02 NOTE — Telephone Encounter (Signed)
Yes, physician recommendation is to start with reducing her Eliquis to 2.5mg  to see if that helps. "If she doesn't stabilize her h/h on a reduced dose, I think she will have to stop Eliquis.  She can't just keep getting transfused and IV iron."

## 2021-07-02 NOTE — Telephone Encounter (Signed)
Hi just to be clear is the pt being reduced to 2.5mg  and still taking the Eliquis twice a day?

## 2021-07-03 NOTE — Telephone Encounter (Signed)
Called NP advised of message below, she stated she had already seen the notes below and made the changes.

## 2021-07-05 ENCOUNTER — Encounter (HOSPITAL_COMMUNITY): Payer: Medicare (Managed Care)

## 2021-07-05 ENCOUNTER — Telehealth: Payer: Self-pay

## 2021-07-05 NOTE — Telephone Encounter (Signed)
Spoke with patient's daughter [name] and scheduled an in-person Palliative Consult for 07/31/21 @ 10:30 AM with Dr. Hollace Kinnier. Documentation will be noted in [place].   COVID screening was negative. No pets in home. Patient lives with daughter.   Consent obtained; updated Outlook/Netsmart/Team List and Epic.   Family is aware they may be receiving a call from NP the day before or day of to confirm appointment.

## 2021-07-08 ENCOUNTER — Non-Acute Institutional Stay (HOSPITAL_COMMUNITY)
Admission: RE | Admit: 2021-07-08 | Discharge: 2021-07-08 | Disposition: A | Discharge status: Home / Self Care | Payer: Medicare (Managed Care) | Source: Ambulatory Visit | Attending: Internal Medicine | Admitting: Internal Medicine

## 2021-07-08 ENCOUNTER — Other Ambulatory Visit: Payer: Self-pay

## 2021-07-08 DIAGNOSIS — D649 Anemia, unspecified: Secondary | ICD-10-CM | POA: Diagnosis present

## 2021-07-08 LAB — PREPARE RBC (CROSSMATCH)

## 2021-07-08 MED ORDER — SODIUM CHLORIDE 0.9% IV SOLUTION: Freq: Once | INTRAVENOUS | Status: DC

## 2021-07-08 NOTE — Progress Notes (Signed)
Patient arrived for blood transfusion. Order had been faxed but was not signed by MD so it was unable to be used. Called [facility] and requested new order with MD signature. Spoke directly with Cletis Athens, NP and received verbal order to Transfuse 2 units PRBC.

## 2021-07-08 NOTE — Progress Notes (Signed)
PATIENT CARE CENTER NOTE   Diagnosis: D50.9- Iron Deficiency Anemia      Provider: Cletis Athens, FNP     Procedure: 2 units PRBC     Note: Type and Screen drawn. Patient received transfusion of 2 units PRBC's via PIV. Patient tolerated transfusion well with no adverse reaction. Discharge instructions given. Patient alert, oriented and ambulatory to wheelchair. Vital signs stable. Discharged home with daughter, [name].

## 2021-07-09 LAB — TYPE AND SCREEN
ABO/RH(D): O POS
Antibody Screen: NEGATIVE
Unit division: 0
Unit division: 0

## 2021-07-09 LAB — BPAM RBC
Blood Product Expiration Date: 202208262359
Blood Product Expiration Date: 202208262359
ISSUE DATE / TIME: 202207251235
ISSUE DATE / TIME: 202207251235
Unit Type and Rh: 5100
Unit Type and Rh: 5100

## 2021-07-23 ENCOUNTER — Telehealth: Payer: Self-pay

## 2021-07-23 ENCOUNTER — Encounter: Payer: Self-pay | Admitting: Nurse Practitioner

## 2021-07-23 ENCOUNTER — Ambulatory Visit (INDEPENDENT_AMBULATORY_CARE_PROVIDER_SITE_OTHER): Payer: Medicare (Managed Care) | Admitting: Nurse Practitioner

## 2021-07-23 VITALS — BP 98/60 | HR 70 | Ht 65.0 in | Wt 128.0 lb

## 2021-07-23 DIAGNOSIS — D509 Iron deficiency anemia, unspecified: Secondary | ICD-10-CM

## 2021-07-23 NOTE — Patient Instructions (Signed)
If you are age 77 or older, your body mass index should be between 23-30. Your Body mass index is 21.3 kg/m. If this is out of the aforementioned range listed, please consider follow up with your Primary Care Provider.  The [facility] providers would like to encourage you to use [facility] to communicate with providers for non-urgent requests or questions.  Due to long hold times on the telephone, sending your provider a message by [facility] may be faster and more efficient way to get a response. Please allow 48 business hours for a response.  Please remember that this is for non-urgent requests/questions.  Continue to follow up with your Hematologist.  Go to local emergency room if you develop chest pain,shortness of breath, or frequent loose black stool.  Once Dr. Silverio Decamp returns to the clinic we will call to get your procedure scheduled.  It was great seeing you today! Thank you for entrusting me with your care and choosing [facility]  Noralyn Pick, CRNP

## 2021-07-23 NOTE — Telephone Encounter (Signed)
Clinical pharmacist to review Eliquis 

## 2021-07-23 NOTE — Telephone Encounter (Signed)
Patient with diagnosis of afib on Eliquis for anticoagulation.    Procedure: Small bowel enterscopy Date of procedure: TBD  CHA2DS2-VASc Score = 7  This indicates a 11.2% annual risk of stroke. The patient's score is based upon: CHF History: Yes HTN History: Yes Diabetes History: No Stroke History: Yes Vascular Disease History: No Age Score: 2 Gender Score: 1     CrCl 23.9 ml/min  Patient has hx of both CVA and GI bleed. With patient's current renal function and recent needs for transfusions a 2 day hold seems very reasonable.   Per office protocol, patient can hold Eliquis for 2 days prior to procedure.

## 2021-07-23 NOTE — Telephone Encounter (Signed)
Request for surgical clearance:     Endoscopy Procedure  What type of surgery is being performed?     Small bowel enterscopy  When is this surgery scheduled?     TBD  What type of clearance is required ?   Pharmacy  Are there any medications that need to be held prior to surgery and how long? Eliquis 2 day hold  Practice name and name of physician performing surgery?      [facility]  What is your office phone and fax number?      Phone- [phone]  [phone]  Anesthesia type (None, local, MAC, general) ?       MAC

## 2021-07-23 NOTE — Progress Notes (Signed)
07/23/2021 [name] [id number] Apr 14, 1946   Chief Complaint: Anemia, constipation   History of Present Illness: [name] is a 75 year old female with a past medical history of hyperlipidemia, atrial fibrillation on Eliquis, chronic diastolic CHF, complete heart block s/p pacemaker, CVA, COPD, CKD stage 3b, laryngeal stenosis s/p tracheostomy, polymyalgia rheumatica, IDA, chronic GI blood loss secondary to AVMs.  She presents to our office today as referred by her hematologist for anemia follow-up and to consider repeat EGD due to persistent IDA requiring blood transfusions every other week (last transfused on 07/08/2021) and IV iron as needed. Dr. Alvy Bimler last saw the patient in office on 06/28/2021, at that time she was concerned the blood transfusions and IV iron were not keeping up with her blood loss, likely due to chronic bleeding small bowel AVMs. She is also receiving EPO per her nephrologist Dr. Carolin Sicks. Note, last Hg level was 8.1 in Epic dated 01/07/2021.   History of atrial fibrillation, Eliquis dose was recently reduced to 2.5 mg twice daily. She is passing a normal formed brown bowel movement every other day.  Her stool color is chronically black since she has been on oral iron.  Her stool is solid or pasty at times.  She denies passing frequent loose black tarry stools.  No bright red blood per the rectum.  She underwent an EGD and colonoscopy by Dr. Silverio Decamp 09/04/2020.  The EGD identified erosive gastropathy without stigmata of recent bleeding and to AVMs were in the duodenum which were treated with APC.  The colonoscopy identified 8 tubular adenomatous polyps which were removed from the colon without dysplasia, melanosis in the colon and nonbleeding internal hemorrhoids.  She complains of having excessive amount of white color mucus in her throat and trach which is difficult to cough/expel out.  She denies any difficulty swallowing pills, food or liquids.   She is scheduled to see her ENT Dr. Redmond Baseman who manages her tracheostomy on 08/20/2021.   EGD 09/04/2020: - Z-line regular, 35 cm from the incisors. - Erosive gastropathy with no bleeding and no stigmata of recent bleeding. - Two angiodysplastic lesions typical arborization in the duodenum. Treated with argon plasma coagulation (APC). - The examination was otherwise normal. - No specimens collected.  Colonoscopy 09/04/2020: - Five 3 to 5 mm polyps in the transverse colon, removed with a cold snare. Resected and retrieved. - Three 1 to 2 mm polyps in the sigmoid colon and in the descending colon, removed with a cold biopsy forceps. Resected and retrieved. - Melanosis in the colon. - Non-bleeding internal hemorrhoids. - The examination was otherwise normal. FINAL MICROSCOPIC DIAGNOSIS:   A. COLON, TRANSVERSE, POLYPECTOMY:  - Tubular adenoma (s).  - No high-grade dysplasia or carcinoma.   B. COLON, DESCENDING, SIGMOID, POLYPECTOMY:  - Tubular adenoma (s).  - No high-grade dysplasia or carcinoma.  EGD and colonoscopy July 2018.  EGD was unremarkable and colonoscopy with finding of 7 polyps, the largest was 15 mm all removed.  Path consistent with tubular adenomas and follow-up in 3 years was recommended.  Small bowel capsule endoscopy in 2013: Negative but with fair to poor prep Current Outpatient Medications on File Prior to Visit  Medication Sig Dispense Refill   acetaminophen (TYLENOL) 650 MG CR tablet Take 650 mg by mouth every 8 (eight) hours as needed for pain.      albuterol (PROVENTIL HFA;VENTOLIN HFA) 108 (90 Base) MCG/ACT inhaler Inhale 1-2 puffs into the lungs every 6 (six) hours as needed  for wheezing or shortness of breath. 1 Inhaler 6   allopurinol (ZYLOPRIM) 100 MG tablet TAKE 2 TABLETS(200 MG) BY MOUTH DAILY 180 tablet 3   ALPRAZolam (XANAX) 0.5 MG tablet Take 1 tablet (0.5 mg total) by mouth at bedtime as needed for anxiety. 30 tablet 0   apixaban (ELIQUIS) 5 MG TABS  tablet Take 1 tablet (5 mg total) by mouth 2 (two) times daily. (Patient taking differently: Take 2.5 mg by mouth 2 (two) times daily.) 180 tablet 1   bimatoprost (LUMIGAN) 0.01 % SOLN Place 1 drop into both eyes at bedtime.     brimonidine (ALPHAGAN) 0.2 % ophthalmic solution Place 1 drop into the right eye in the morning and at bedtime.     cetirizine (ZYRTEC) 10 MG tablet Take 10 mg by mouth daily as needed for allergies.      Epoetin Alfa-epbx (RETACRIT IJ) Inject 1 Dose as directed once a week.      ferrous sulfate 325 (65 FE) MG tablet TAKE 1 TABLET BY MOUTH TWICE DAILY WITH MEALS TO KEEP BLOOD COUNT UP 60 tablet 11   furosemide (LASIX) 20 MG tablet Take 1 tablet (20 mg total) by mouth daily. Daily as needed for weight gain of 2lbs in a day or 5lbs in a week 30 tablet 6   ipratropium-albuterol (DUONEB) 0.5-2.5 (3) MG/3ML SOLN USE 3 ML VIA NEBULIZER EVERY 6 HOURS 360 mL 0   latanoprost (XALATAN) 0.005 % ophthalmic solution Place 1 drop into both eyes at bedtime.     metoprolol tartrate (LOPRESSOR) 25 MG tablet Take 1 tablet (25 mg total) by mouth 2 (two) times daily. 60 tablet 5   mirtazapine (REMERON) 7.5 MG tablet Take 7.5 mg by mouth at bedtime.     nitroGLYCERIN (NITROSTAT) 0.4 MG SL tablet Place 1 tablet (0.4 mg total) under the tongue every 5 (five) minutes as needed for chest pain. 25 tablet 1   Olopatadine HCl 0.2 % SOLN Place 1-2 drops into both eyes daily as needed (itching).   1   oxymetazoline (AFRIN NASAL SPRAY) 0.05 % nasal spray Place 1 spray into both nostrils 2 (two) times daily. 30 mL 0   potassium chloride (K-DUR) 10 MEQ tablet Take 1 tablet (10 mEq total) by mouth daily. 90 tablet 0   rosuvastatin (CRESTOR) 20 MG tablet Take 1 tablet (20 mg total) by mouth at bedtime. 90 tablet 3   sodium chloride (OCEAN) 0.65 % SOLN nasal spray Place 1 spray into both nostrils daily as needed for congestion.      STIMULANT LAXATIVE 8.6-50 MG tablet Take 2 tablets by mouth at bedtime as  needed for mild constipation.      No current facility-administered medications on file prior to visit.    Current Medications, Allergies, Past Medical History, Past Surgical History, Family History and Social History were reviewed in [employer] record.   Review of Systems:   Constitutional: + Fatigue. Negative for fever, sweats, chills or weight loss.  Respiratory: Negative for shortness of breath.   Cardiovascular: Negative for chest pain, palpitations and leg swelling.  Gastrointestinal: See HPI.  Musculoskeletal: Negative for back pain or muscle aches.  Neurological: Negative for dizziness, headaches or paresthesias.    Physical Exam: BP (!) 80/48   Pulse 70   Ht 5\' 5"  (1.651 m)   Wt 128 lb (58.1 kg)   BMI 21.30 kg/m  Repeat BP 98/60   Pulse 70   Ht 5\' 5"  (1.651 m)  Wt 128 lb (58.1 kg)   BMI 21.30 kg/m   General: 75 year old female in no acute distress. Head: Normocephalic and atraumatic. Eyes: No scleral icterus. Conjunctiva pink . Ears: Normal auditory acuity. Mouth: Dentition intact. No ulcers or lesions.  Neck: Tracheostomy site intact. No sputum at trache site.  Lungs: Clear throughout to auscultation. Chest: Pacemaker right subclavian area.  Heart: Irregular rhythm. No murmur. Abdomen: Soft, nontender and nondistended. No masses or hepatomegaly. Normal bowel sounds x 4 quadrants.  Rectal: Deferred.  Musculoskeletal: Symmetrical with no gross deformities. Extremities: No edema. Neurological: Alert oriented x 4. No focal deficits.  Psychological: Alert and cooperative. Normal mood and affect  Assessment and Recommendations:  75 year old female with chronic IDA secondary to chronic GI blood loss secondary to small bowel AVMs on chronic anticoagulation.  Persistent IDA requiring PRBC transfusions about every 2 weeks and IV iron as needed and remains on Ferrous Sulfate 325 mg twice daily.  EGD 08/2020 unified 2 AVMs in the duodenum treated  with APC and nonbleeding erosive gastropathy.  No overt GI bleeding. -Small bowel enteroscopy at [facility] benefits and risks discussed including risk with sedation, risk of bleeding, perforation and infection  -Request copy of most recent CBC, iron studies from Dr. Alvy Bimler, last Hg level 8.1 in Epic was on 01/07/2021??  -Dr. Silverio Decamp to review today's consult prior to patient scheduling enteroscopy at [facility]  Atrial fibrillation on Eliquis -Our office will contact cardiologist Dr. Rayann Heman to verify Eliquis instructions prior to enteroscopy  History of tubular adenomatous polyps per colonoscopy  06/2017 and 08/2020 -Recall colonoscopy 08/2023  History of CHF. BP low today. No CP or SOB. -Patient to contact PCP/cardiologist for repeat BP  History of CHB s/p pacemaker  History of COPD  History of laryngeal stenosis s/p tracheostomy. Increased mucous production, thick, difficulty to cough out. -Patient instructed to contact ENT Dr. Redmond Baseman regarding increased tracheostomy secretions/mucus  CKD

## 2021-07-25 ENCOUNTER — Telehealth: Payer: Self-pay

## 2021-07-25 NOTE — Telephone Encounter (Signed)
-----   Message from [name] Pick, NP sent at 07/24/2021 10:59 AM EDT ----- [name], pls contact nephrologist for copy of most recent labs since her hematologist did not have any current results thx  ----- Message ----- From: [name] Lark, MD Sent: 07/24/2021   9:45 AM EDT To: [name] Pick, NP  No, I am not managing her anemia ----- Message ----- From: [name] Pick, NP Sent: 07/24/2021   8:32 AM EDT To: [name] Lark, MD  Good morning Dr. Alvy Bimler,  I saw [name] in the office yesterday, I saw a Hg level in Jan. 2022. Do you have any recent CBC or iron levels?  Thank you, [name]

## 2021-07-26 NOTE — Telephone Encounter (Signed)
Hold in my basket until Dr. Silverio Decamp reviews.

## 2021-07-26 NOTE — Telephone Encounter (Signed)
Request faxed

## 2021-08-08 ENCOUNTER — Non-Acute Institutional Stay (HOSPITAL_COMMUNITY)
Admission: RE | Admit: 2021-08-08 | Discharge: 2021-08-08 | Disposition: A | Discharge status: Home / Self Care | Payer: Medicare (Managed Care) | Source: Ambulatory Visit | Attending: Internal Medicine | Admitting: Internal Medicine

## 2021-08-08 ENCOUNTER — Other Ambulatory Visit: Payer: Self-pay

## 2021-08-08 DIAGNOSIS — D649 Anemia, unspecified: Secondary | ICD-10-CM | POA: Diagnosis not present

## 2021-08-08 LAB — PREPARE RBC (CROSSMATCH)

## 2021-08-08 MED ORDER — SODIUM CHLORIDE 0.9% IV SOLUTION: Freq: Once | INTRAVENOUS | Status: AC

## 2021-08-08 NOTE — Progress Notes (Signed)
Patient received via PIV 2 unit of packed red blood cells as ordered by Barney Drain MD. Type and screen was done before transfusion. Consent for blood products valid. Pre transfusion medications were not ordered.   Tolerated well, vitals stable, discharge instructions given, verbalized understanding. Patient alert, oriented and ambulatory with cane at the time of discharge, accompanied by daughter [name].

## 2021-08-09 LAB — TYPE AND SCREEN
ABO/RH(D): O POS
Antibody Screen: NEGATIVE
Unit division: 0
Unit division: 0

## 2021-08-09 LAB — BPAM RBC
Blood Product Expiration Date: 202209272359
Blood Product Expiration Date: 202209272359
ISSUE DATE / TIME: 202208251135
ISSUE DATE / TIME: 202208251135
Unit Type and Rh: 5100
Unit Type and Rh: 5100

## 2021-08-12 ENCOUNTER — Encounter (INDEPENDENT_AMBULATORY_CARE_PROVIDER_SITE_OTHER): Payer: Medicare (Managed Care) | Admitting: Ophthalmology

## 2021-08-12 ENCOUNTER — Other Ambulatory Visit: Payer: Self-pay

## 2021-08-12 ENCOUNTER — Encounter (INDEPENDENT_AMBULATORY_CARE_PROVIDER_SITE_OTHER): Payer: Self-pay

## 2021-08-12 DIAGNOSIS — Z8774 Personal history of (corrected) congenital malformations of heart and circulatory system: Secondary | ICD-10-CM

## 2021-08-12 DIAGNOSIS — D509 Iron deficiency anemia, unspecified: Secondary | ICD-10-CM

## 2021-08-12 DIAGNOSIS — H43811 Vitreous degeneration, right eye: Secondary | ICD-10-CM

## 2021-08-12 DIAGNOSIS — H401130 Primary open-angle glaucoma, bilateral, stage unspecified: Secondary | ICD-10-CM

## 2021-08-12 DIAGNOSIS — I10 Essential (primary) hypertension: Secondary | ICD-10-CM

## 2021-08-12 DIAGNOSIS — Z961 Presence of intraocular lens: Secondary | ICD-10-CM

## 2021-08-12 DIAGNOSIS — H35033 Hypertensive retinopathy, bilateral: Secondary | ICD-10-CM

## 2021-08-12 DIAGNOSIS — H3581 Retinal edema: Secondary | ICD-10-CM

## 2021-08-12 NOTE — Telephone Encounter (Signed)
Attempt to contact pt/pts daughter. No answer. LMTCB to clarify procedure

## 2021-08-12 NOTE — Telephone Encounter (Signed)
Patient with diagnosis of afib on Eliquis for anticoagulation.     Procedure: Small bowel enterscopy Date of procedure: TBD   CHA2DS2-VASc Score = 7  This indicates a 11.2% annual risk of stroke. The patient's score is based upon: CHF History: Yes HTN History: Yes Diabetes History: No Stroke History: Yes Vascular Disease History: No Age Score: 2 Gender Score: 1      CrCl 23.9 ml/min   Patient has hx of both CVA and GI bleed. With patient's current renal function and recent needs for transfusions a 2 day hold seems very reasonable.    Per office protocol, patient can hold Eliquis for 2 days prior to procedure.

## 2021-08-12 NOTE — Telephone Encounter (Signed)
Spoke with patients daughter, Cecelia Byars. Per Aida patient has tried to do capsule study before with no success. Daughter states she does not think patient would not be able to swallow pill as she previously could not.Please advise,thank you

## 2021-08-12 NOTE — Telephone Encounter (Signed)
Lauren, please contact the patient and schedule her for a small bowel capsule endoscopy with Dr. Silverio Decamp at [facility].  Refer to office visit 07/23/2021.   Melissa, proceed with obtaining Eliquis clearance and obtaining copy of labs (CBC and iron studies if done) likely from her nephrologist as Dr. Alvy Bimler is not following her anemia or ordering her transfusions. THX

## 2021-08-12 NOTE — Telephone Encounter (Signed)
-----   Message from Mauri Pole, MD sent at 08/12/2021  5:45 AM EDT ----- Probably its done at outside facility. She will need further evaluation given transfusion requirement, will have to schedule it at hospital.  Thanks ----- Message ----- From: Noralyn Pick, NP Sent: 07/24/2021   8:42 AM EDT To: Mauri Pole, MD  Dr. Silverio Decamp, I will forward any current lab results to you, I am not sure why I cannot access any recent lab results in Epic as she is getting frequent PRBc transfusions and IV iron. Let me know if you advise for patient to undergo an enteroscopy? Thx

## 2021-08-12 NOTE — Telephone Encounter (Signed)
Lauren, I did not order a small bowel capsule endoscopy. I ordered a small bowel enteroscopy which is similar to an upper endoscopy with a scope that passes down the esophagus, stomach, duodenum and further into the jejunum (small intestine). I am adding [name] to this message so she can further answer any questions about how to schedule this.   Please contact the patient and daughter, schedule her for a small bowel enteroscopy at [facility] with Dr. Silverio Decamp if the patient is willing due to her persistent anemia dependent on blood transfusions.   Refer to her last office consult   Let me know if you have any further questions

## 2021-08-13 ENCOUNTER — Other Ambulatory Visit: Payer: Self-pay

## 2021-08-13 DIAGNOSIS — K5520 Angiodysplasia of colon without hemorrhage: Secondary | ICD-10-CM

## 2021-08-13 DIAGNOSIS — D509 Iron deficiency anemia, unspecified: Secondary | ICD-10-CM

## 2021-08-13 NOTE — Telephone Encounter (Signed)
Thanks [place], my understanding is we are trying to do procedures based on need and availability for hospital cases and MD approval prior to booking the case is not necessary. It is good to be informed so we are prepared , I am cc'ing Dr [name]. Thanks

## 2021-08-13 NOTE — Telephone Encounter (Signed)
Lauren, I re-reviewed my prior message to you dated 08/12/2021 and it was MY error in messaging that requested a small bowel capsule endoscopy, this was not your error.  I meant to order a small bowel enteroscopy with Dr. Silverio Decamp at [facility] which is the correct procedure now ordered.   Dr. Silverio Decamp, patient is scheduled for a small bowel enteroscopy with Dr. [name] on 09/30/2021 at [facility]? Do you have an earlier available date for enteroscopy or is this date acceptable with Dr. [name]? Do we need to get approval from Dr. [name] to do her procedure?

## 2021-08-13 NOTE — Telephone Encounter (Signed)
No problem at all [facility] and I got her scheduled!

## 2021-08-13 NOTE — Telephone Encounter (Signed)
Scheduled for balloon enteroscopy at [facility] on 09/30/21. Arrive at 8:45 am. NPO after midnight. Instructions will be mailed. Procedure will be with Dr [name].  Message left to return the call to discuss.

## 2021-09-03 ENCOUNTER — Ambulatory Visit (INDEPENDENT_AMBULATORY_CARE_PROVIDER_SITE_OTHER): Payer: Medicare (Managed Care) | Admitting: Physician Assistant

## 2021-09-03 ENCOUNTER — Encounter: Payer: Self-pay | Admitting: Physician Assistant

## 2021-09-03 ENCOUNTER — Other Ambulatory Visit: Payer: Self-pay

## 2021-09-03 VITALS — BP 118/62 | HR 70 | Ht 62.0 in | Wt 127.3 lb

## 2021-09-03 DIAGNOSIS — Z7901 Long term (current) use of anticoagulants: Secondary | ICD-10-CM | POA: Diagnosis not present

## 2021-09-03 DIAGNOSIS — Z01810 Encounter for preprocedural cardiovascular examination: Secondary | ICD-10-CM | POA: Diagnosis not present

## 2021-09-03 DIAGNOSIS — I4821 Permanent atrial fibrillation: Secondary | ICD-10-CM

## 2021-09-03 DIAGNOSIS — I10 Essential (primary) hypertension: Secondary | ICD-10-CM

## 2021-09-03 DIAGNOSIS — Z8719 Personal history of other diseases of the digestive system: Secondary | ICD-10-CM

## 2021-09-03 DIAGNOSIS — I422 Other hypertrophic cardiomyopathy: Secondary | ICD-10-CM

## 2021-09-03 DIAGNOSIS — Z01818 Encounter for other preprocedural examination: Secondary | ICD-10-CM

## 2021-09-03 DIAGNOSIS — I442 Atrioventricular block, complete: Secondary | ICD-10-CM

## 2021-09-03 DIAGNOSIS — D649 Anemia, unspecified: Secondary | ICD-10-CM | POA: Diagnosis not present

## 2021-09-03 DIAGNOSIS — N184 Chronic kidney disease, stage 4 (severe): Secondary | ICD-10-CM

## 2021-09-03 MED ORDER — APIXABAN 2.5 MG PO TABS: 2.5000 mg | ORAL_TABLET | Freq: Two times a day (BID) | ORAL | 1 refills | Status: DC

## 2021-09-03 NOTE — Progress Notes (Signed)
Cardiology Office Note:    Date:  09/03/2021   ID:  Brylie Sneath, DOB 11-May-1946, MRN [id number]  PCP:  [facility]  Cardiologist:  Skeet Latch, MD   Referring MD: [facility]   Chief Complaint  Patient presents with   Pre-op Exam     History of Present Illness:    Kenza Munar is a 75 y.o. female with a hx of hypertension, hyperlipidemia, anemia of chronic disease, chronic diastolic heart failure, CKD stage II, permanent atrial fibrillation and complete heart block status post pacemaker placement in 1998, COPD, GERD, OSA and laryngeal stenosis status post trach, and polymyalgia rheumatica.  She has a history of a GI bleed in 2012 from gastric AVMs, and was therefore not on anticoagulation for atrial fibrillation.  She had normal coronary arteries by heart cath in 08/2016. She was referred for watchman device in 4650, workup complicated by acute respiratory distress during TEE requiring intubation. She developed acute biventricular failure that eventually normalized.  She was seen in clinic on 09/23/2018 by Dr. Rayann Heman.  She was doing well at that time.  Pacemaker function was appropriate.  Since that visit she has had multiple ER visits.    She was hospitalized from 05/08/2019 through 05/11/2019 for new onset stroke with expressive aphasia.  She initially presented for shortness of breath which she had attributed to a buildup of mucus in her tracheostomy.  CT head showed possible stroke in the lower left parietal lobe.  MRI was not obtained due to pacemaker placement.  She does have a history of GI bleed in 2012.  GI was consulted and she was deemed a candidate for anticoagulation.  She was started on Eliquis 5 mg twice daily. Since that time, she had a negative work up in the ER 05/15/19 for CP and SOB. Cardiology was consulted and no further workup deemed necessary, she was discharged on 05/17/19.  I saw her in follow up 05/19/19. At  that time, she reported blood in her stool. Given her baseline anemia of chronic disease with a baseline Hb of 9, I contacted Dr. Silverio Decamp, her GI doctor. Per her instructions, repeat CBC was obtained and her Hb was stable. She last saw [name] 10/04/20. She has been frustrated by her limitations and trach in the past. I saw her on 05/08/21 and she ws receiving feraheme infusions and blood transfusions monthly for the last year or longer.  She was unable to have her trach removed stating she was told it was too late.  She has followed with nephrology and they have discussed potentially needing HD in the future and she had been mentally preparing for this.  Overall she looks much stronger than the last time I saw her.    She presents back today for routine follow-up.  She is here with daughter. Again, looking a bit stronger. Not on O2, still with trach in place.  No cardiac complaints. They have moved and she is now in better spirits and going to [facility] once per week.   She still requires blood transfusions for anemia with suspicion for ongoing chronic GI bleed. Hematology has discussed stopping eliquis altogether, but she does not want to do this given her high stroke risk. They have instead reduced eliquis to 2.5 mg BID in an attempt to slow bleeding. This happened last month.  It appears GI may be planning small bowel enteroscopy, but has not scheduled yet.   Past Medical History:  Diagnosis Date   Anemia    Angioedema    2/2 ACE   Arteriovenous malformation of stomach    Arthritis    Asthma    AVM (arteriovenous malformation) of colon    small intestine; stomach   Blood transfusion    CHF (congestive heart failure) (HCC)    Complete heart block (HCC)    s/p PPM 4481   Complication of anesthesia    Difficult airway; anaphylaxis and swelling with propofol   DDD (degenerative disc disease)    Depression    Diastolic heart failure    Fatty liver 07/26/10   GERD (gastroesophageal reflux  disease)    GI bleed    History of alcohol abuse Stopped Fall 2012   History of tobacco use Quit Fall 2012   Hx of cardiovascular stress test    a. Lexiscan Myoview (10/15):  Small inferolateral and apical defect c/w scar and poss soft tissue attenuation, no ischemia, EF 42%   Hx of colonic polyp 08/13/10   adenomatous   Hx of colonoscopy    Hyperlipidemia    Hypertension    Hypertrophic cardiomyopathy ([place])    dx by Dr [name] 2009   Iron deficiency anemia    Myocardial infarction [facility])    Pacemaker    Panic attacks    Permanent atrial fibrillation ([place])    Renal failure    baseline creatinine 1.6   Right arm pain 01/08/2012   RLS (restless legs syndrome)    Dx 06/2007   Sleep apnea    no cpap     Past Surgical History:  Procedure Laterality Date   BIOPSY  09/04/2020   Procedure: BIOPSY;  Surgeon: Mauri Pole, MD;  Location: [facility];  Service: Endoscopy;;   CARDIAC CATHETERIZATION     CARDIAC CATHETERIZATION N/A 08/19/2016   Procedure: Right/Left Heart Cath and Coronary Angiography;  Surgeon: Jolaine Artist, MD;  Location: [facility];  Service: Cardiovascular;  Laterality: N/A;   COLONOSCOPY WITH PROPOFOL N/A 06/16/2017   Procedure: COLONOSCOPY WITH PROPOFOL;  Surgeon: Mauri Pole, MD;  Location: [facility];  Service: Endoscopy;  Laterality: N/A;   COLONOSCOPY WITH PROPOFOL N/A 09/04/2020   Procedure: COLONOSCOPY WITH PROPOFOL;  Surgeon: Mauri Pole, MD;  Location: [facility];  Service: Endoscopy;  Laterality: N/A;   DIRECT LARYNGOSCOPY N/A 09/18/2016   Procedure: DIRECT LARYNGOSCOPY, BRONCHOSCOPY, REMOVAL OF INTUBATION GRANULOMA;  Surgeon: [name], MD;  Location: [facility] OR;  Service: ENT;  Laterality: N/A;   DIRECT LARYNGOSCOPY N/A 10/20/2016   Procedure: EXTUBATION AND FLEXIBLE LARYNGOSCOPE;  Surgeon: [name], MD;  Location: [facility] OR;  Service: ENT;  Laterality: N/A;   DIRECT LARYNGOSCOPY N/A 10/29/2016   Procedure: DIRECT  LARYNGOSCOPY;  Surgeon: [name], MD;  Location: [facility] OR;  Service: ENT;  Laterality: N/A;   ESOPHAGOGASTRODUODENOSCOPY  12/23/2011   Procedure: ESOPHAGOGASTRODUODENOSCOPY (EGD);  Surgeon: [name], MD;  Location: [facility];  Service: Endoscopy;  Laterality: N/A;   ESOPHAGOGASTRODUODENOSCOPY (EGD) WITH PROPOFOL N/A 06/16/2017   Procedure: ESOPHAGOGASTRODUODENOSCOPY (EGD) WITH PROPOFOL;  Surgeon: Mauri Pole, MD;  [facility];  Service: Endoscopy;  Laterality: N/A;   ESOPHAGOGASTRODUODENOSCOPY (EGD) WITH PROPOFOL N/A 09/04/2020   Procedure: ESOPHAGOGASTRODUODENOSCOPY (EGD) WITH PROPOFOL;  Surgeon: Mauri Pole, MD;  [facility];  Service: Endoscopy;  Laterality: N/A;   EXTUBATION (ENDOTRACHEAL) IN OR N/A 07/21/2016   Procedure: EXTUBATION (ENDOTRACHEAL) IN OR;  Surgeon: [name], MD;  Location: [place];  Service: ENT;  Laterality:  N/A;   GIVENS CAPSULE STUDY  12/23/2011   Procedure: GIVENS CAPSULE STUDY;  Surgeon: [name], MD;  [facility]: [facility];  Service: Endoscopy;  Laterality: N/A;   HOT HEMOSTASIS N/A 09/04/2020   Procedure: HOT HEMOSTASIS (ARGON PLASMA COAGULATION/BICAP);  Surgeon: Mauri Pole, MD;  Location: [facility];  Service: Endoscopy;  Laterality: N/A;   MICROLARYNGOSCOPY WITH LASER N/A 10/16/2016   Procedure: MICRODIRECTLARYNGOSCOPY WITH LASER ABLATION AND KENLOG INJECTION;  Surgeon: [name], MD;  Location: [place];  Service: ENT;  Laterality: N/A;   [place], most recent gen change by [facility] 4/12   POLYPECTOMY  09/04/2020   Procedure: POLYPECTOMY;  Surgeon: Mauri Pole, MD;  [facility];  Service: Endoscopy;;   TRACHEOSTOMY TUBE PLACEMENT N/A 10/29/2016   Procedure: TRACHEOSTOMY;  Surgeon: [name], MD;  Location: [place];  Service: ENT;  Laterality: N/A;   TUBAL LIGATION  04/01/2000    Current Medications: Current Meds  Medication Sig   acetaminophen (TYLENOL) 650 MG  CR tablet Take 650 mg by mouth every 8 (eight) hours as needed for pain.    albuterol (PROVENTIL HFA;VENTOLIN HFA) 108 (90 Base) MCG/ACT inhaler Inhale 1-2 puffs into the lungs every 6 (six) hours as needed for wheezing or shortness of breath.   allopurinol (ZYLOPRIM) 100 MG tablet TAKE 2 TABLETS(200 MG) BY MOUTH DAILY   ALPRAZolam (XANAX) 0.5 MG tablet Take 1 tablet (0.5 mg total) by mouth at bedtime as needed for anxiety.   bimatoprost (LUMIGAN) 0.01 % SOLN Place 1 drop into both eyes at bedtime.   brimonidine (ALPHAGAN) 0.2 % ophthalmic solution Place 1 drop into the right eye in the morning and at bedtime.   cetirizine (ZYRTEC) 10 MG tablet Take 10 mg by mouth daily as needed for allergies.    Epoetin Alfa-epbx (RETACRIT IJ) Inject 1 Dose as directed once a week.    ferrous sulfate 325 (65 FE) MG tablet TAKE 1 TABLET BY MOUTH TWICE DAILY WITH MEALS TO KEEP BLOOD COUNT UP   furosemide (LASIX) 20 MG tablet Take 1 tablet (20 mg total) by mouth daily. Daily as needed for weight gain of 2lbs in a day or 5lbs in a week   ipratropium-albuterol (DUONEB) 0.5-2.5 (3) MG/3ML SOLN USE 3 ML VIA NEBULIZER EVERY 6 HOURS   latanoprost (XALATAN) 0.005 % ophthalmic solution Place 1 drop into both eyes at bedtime.   metoprolol tartrate (LOPRESSOR) 25 MG tablet Take 1 tablet (25 mg total) by mouth 2 (two) times daily.   mirtazapine (REMERON) 7.5 MG tablet Take 7.5 mg by mouth at bedtime.   nitroGLYCERIN (NITROSTAT) 0.4 MG SL tablet Place 1 tablet (0.4 mg total) under the tongue every 5 (five) minutes as needed for chest pain.   Olopatadine HCl 0.2 % SOLN Place 1-2 drops into both eyes daily as needed (itching).    oxymetazoline (AFRIN NASAL SPRAY) 0.05 % nasal spray Place 1 spray into both nostrils 2 (two) times daily.   potassium chloride (K-DUR) 10 MEQ tablet Take 1 tablet (10 mEq total) by mouth daily.   rosuvastatin (CRESTOR) 20 MG tablet Take 1 tablet (20 mg total) by mouth at bedtime.   sodium chloride  (OCEAN) 0.65 % SOLN nasal spray Place 1 spray into both nostrils daily as needed for congestion.    STIMULANT LAXATIVE 8.6-50 MG tablet Take 2 tablets by mouth at bedtime as needed for mild constipation.    [DISCONTINUED] apixaban (ELIQUIS) 5 MG TABS tablet  Take 1 tablet (5 mg total) by mouth 2 (two) times daily. (Patient taking differently: Take 2.5 mg by mouth 2 (two) times daily.)     Allergies:   Ace inhibitors, Other, and Propofol   Social History   Socioeconomic History   Marital status: Divorced    Spouse name: Not on file   Number of children: 4   Years of education: 9   Highest education level: Not on file  Occupational History   Occupation: disabled    Employer: UNEMPLOYED   Occupation: previously- Development worker, community  Tobacco Use   Smoking status: Former    Packs/day: 0.10    Years: 50.00    Pack years: 5.00    Types: Cigarettes    Quit date: 08/16/2011    Years since quitting: 10.0   Smokeless tobacco: Never   Tobacco comments:    quit 2012  Vaping Use   Vaping Use: Never used  Substance and Sexual Activity   Alcohol use: No    Comment: quit 08/16/2011   Drug use: No   Sexual activity: Not Currently  Other Topics Concern   Not on file  Social History Narrative   On disability for heart failure/pacemaker.     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    4 grown children,  8 grandchildren.     Drinks alcohol a few times a week socially. At one time, the most she reports drinking is about 3 mixed drinks.   Lives with daughter [name] and son. Will be moving 04/2011 to different house because concerned children are taking advantage of her financial situation.    No illicit drugs.   Smokes few cigarettes daily, especially when stressed. Started smoking @ age 1. Quit January 2012 2/2 health but started smoking again 02/2011.       Health Care POA:    Emergency Contact: daughter, Verdene Lennert [phone]   End of Life Plan: gave pt AD info pamphlet   Who lives  with you: no one- lives in section 8 housing   Any pets: none   Diet: Pt has a varied diet of protein, starch and vegetables   Exercise: Pt has no regular exercise routine.   Seatbelts: Pt reports wearing seatbelt when in vehicles.    Hobbies: dancing   Social Determinants of Health   Financial Resource Strain: Not on file  Food Insecurity: Not on file  Transportation Needs: Not on file  Physical Activity: Not on file  Stress: Not on file  Social Connections: Not on file     Family History: The patient's family history includes Alcohol abuse in her brother and father; Aneurysm in her mother; CVA in her mother; Cancer in her brother; Colon cancer in her brother; Diabetes in her brother and sister; Heart attack in her brother, brother, and father; Heart disease in her daughter, mother, and sister; Hypertension in her brother, father, mother, and sister; Stroke in her father and mother. There is no history of Esophageal cancer, Stomach cancer, or Rectal cancer.  ROS:   Please see the history of present illness.     All other systems reviewed and are negative.  EKGs/Labs/Other Studies Reviewed:    The following studies were reviewed today:  Echo 2020 1. The left ventricle has hyperdynamic systolic function, with an  ejection fraction of >65%. The cavity size was normal. There is severely  increased left ventricular wall thickness. Findings are consistent with  hypertrophic cardiomyopathy. Left  ventricular diastolic Doppler parameters are consistent  with  pseudonormalization.   2. The right ventricle has normal systolic function. The cavity was  normal. There is no increase in right ventricular wall thickness.   3. Left atrial size was moderately dilated.   4. Right atrial size was moderately dilated.   5. Mitral valve regurgitation is moderate by color flow Doppler.   6. The aortic valve is tricuspid. Mild thickening of the aortic valve.  Mild calcification of the aortic valve.    EKG:  EKG is  ordered today.  The ekg ordered today demonstrates ventricular paced rhythm HR 70  Recent Labs: 10/18/2020: ALT 15; BUN 30; Creatinine, Ser 1.77; Potassium 3.9; Sodium 142 01/07/2021: Hemoglobin 8.1  Recent Lipid Panel    Component Value Date/Time   CHOL 151 10/18/2020 1102   TRIG 114 10/18/2020 1102   HDL 55 10/18/2020 1102   CHOLHDL 2.7 10/18/2020 1102   CHOLHDL 2.3 05/09/2019 0402   VLDL 20 05/09/2019 0402   LDLCALC 76 10/18/2020 1102   LDLDIRECT 161.8 02/21/2013 1416    Physical Exam:    VS:  BP 118/62   Pulse 70   Ht 5\' 2"  (1.575 m)   Wt 127 lb 4.8 oz (57.7 kg)   SpO2 98%   BMI 23.28 kg/m     Wt Readings from Last 3 Encounters:  09/03/21 127 lb 4.8 oz (57.7 kg)  07/23/21 128 lb (58.1 kg)  06/28/21 124 lb 6.4 oz (56.4 kg)     GEN:  Well nourished, well developed in no acute distress HEENT: Normal NECK: No JVD; No carotid bruits LYMPHATICS: No lymphadenopathy CARDIAC: RRR, no murmurs, rubs, gallops RESPIRATORY:  Clear to auscultation without rales, wheezing or rhonchi , trach in place ABDOMEN: Soft, non-tender, non-distended MUSCULOSKELETAL:  No edema; No deformity  SKIN: Warm and dry NEUROLOGIC:  Alert and oriented x 3 PSYCHIATRIC:  Normal affect   ASSESSMENT:    1. Anemia, unspecified type   2. Chronic anticoagulation   3. History of gastrointestinal bleeding   4. Hypertrophic cardiomyopathy ([place])   5. Essential hypertension   6. Permanent atrial fibrillation ([place])   7. Complete heart block ([place])   8. Chronic kidney disease (CKD), stage IV (severe) ([place])   9. Preoperative clearance    PLAN:    In order of problems listed above:  Chronic anticoagulation Hx of GI bleed Anemia  - not a candidate for watchman due to complications on screening TEE - This patients CHA2DS2-VASc Score and unadjusted Ischemic Stroke Rate (% per year) is equal to 9.7 % stroke rate/year from a score of 6 (CHF, HTN, stroke, age, female) - hematology has  reduced her eliquis to 2.5 mg BID in an attempt to stop her bleeding in the setting of chronic GI bleeding - there was discussion regarding stopping anticoagulation altogether, but she did not want to stop completely - she is now taking 2.5 mg eliquis BID   Hypertrophic cardiomyopathy Hypertension Chronic diastolic heart failure - She has battled hypotension, maintained on beta-blocker  - hypotension improved after transfusions   Chronic atrial fibrillation CHB with PPM in place - follows with Dr. Rayann Heman   CKD stage IV - Follows with nephrology and HD has been discussed - she is agreeable to HD if its needed   Preoperative clearance for endoscopy Given her cardiomyopathy, renal function, and history of stroke, she has a high risk for any procedure (RCRI 11% risk of MACE). Heart cath in 2017 with angiographically normal coronaries. She is euvolemic on exam with no  unstable cardiac issues. She is aware of risks and agrees to proceed with endoscopy.   If a preop request is entered, I will reach out to the pharmD for eliquis hold.    She has an appt with [name] on Monday. I will send my note with questions regarding anticoagulation to [name] to see if its appropriate to move her appt out 1-2 months.  Wed afternoon appointments are best for her daughter.     Medication Adjustments/Labs and Tests Ordered: Current medicines are reviewed at length with the patient today.  Concerns regarding medicines are outlined above.  Orders Placed This Encounter  Procedures   EKG 12-Lead   Meds ordered this encounter  Medications   apixaban (ELIQUIS) 2.5 MG TABS tablet    Sig: Take 1 tablet (2.5 mg total) by mouth 2 (two) times daily.    Dispense:  60 tablet    Refill:  1    Signed, Ledora Bottcher, [place]  09/03/2021 4:04 PM    [facility]

## 2021-09-03 NOTE — Patient Instructions (Signed)
Medication Instructions:  No Changes *If you need a refill on your cardiac medications before your next appointment, please call your pharmacy*   Lab Work: No Labs If you have labs (blood work) drawn today and your tests are completely normal, you will receive your results only by: [place] (if you have MyChart) OR A paper copy in the mail If you have any lab test that is abnormal or we need to change your treatment, we will call you to review the results.   Testing/Procedures: No Testing   Follow-Up: At [facility], you and your health needs are our priority.  As part of our continuing mission to provide you with exceptional heart care, we have created designated Provider Care Teams.  These Care Teams include your primary Cardiologist (physician) and Advanced Practice Providers (APPs -  Physician Assistants and Nurse Practitioners) who all work together to provide you with the care you need, when you need it.  We recommend signing up for the patient portal called "MyChart".  Sign up information is provided on this After Visit Summary.  MyChart is used to connect with patients for Virtual Visits (Telemedicine).  Patients are able to view lab/test results, encounter notes, upcoming appointments, etc.  Non-urgent messages can be sent to your provider as well.   To learn more about what you can do with MyChart, go to [email or web address].    Your next appointment:   September 09, 2021 10:20 AM  The format for your next appointment:   In Person  Provider:   Skeet Latch, MD

## 2021-09-06 NOTE — Progress Notes (Signed)
Cardiology Office Note  Date:  09/09/2021   ID:  [name], DOB 1946/08/24, MRN [id number]  PCP:  Inc, [place]  Cardiologist:   [name] Latch, MD  Electrophysiologist: [name] Grayer, MD   No chief complaint on file.   Patient ID: [name] is a 75 y.o. female with hypertrophic cardiomyopathy, hypertension, hyperlipidemia, CKD IV, chronic atrial fibrillation, (not anticoagulated due to GI bleed), chronic diastolic heart failure, complete heart block status post pacemaker ([employer]. [employer]), and COPD who presents for follow up.    History of Present Illness: [name] was first seen on 12/14/15 as a referral from Dr. Rayann [name]. She was referred to general cardiology to establish care. She had been admitted to the hospital earlier that month with a complaint of chest pain and was noted to be mildly volume overloaded. Her BNP was 436 and echo revealed normal systolic function with grade 2 diastolic dysfunction. She was diuresed with IV Lasix and developed acute renal failure. Troponin was mildly elevated to 0.19.  Lexiscan Cardiolite was negative for ischemia.  [name] continued to struggle with palpitations and dizziness.  She has been in chronic atrial fibrillation and has not been on anticoagulation due to a GI bleed in 2012. She has known AVMs. She was referred for consideration of a Watchman device 05/736, which was complicated by acute respiratory distress during the TEE that required intubation.  She developed acute biventricular failure that eventually normalized.  She was discharged to a SNF and was then readmitted with volume overload.  She has struggled with laryngeal stenosis managed by her ENT in [facility]  She is not a candidate for removal of her trach due to residual stenosis.  She has also been admitted with stroke 04/2019 with residual expressive aphasia.  She was subsequently started on Eliquis despite her GI  bleed history.  The following month she noted blood in her stool but h/h was stable.  She was noted to have acute on chronic renal failure and was referred to nephrology.  She last saw [name] 05/2019 and was doing well from a cardiac standpoint, but has been frustrated by expressive aphasia.   Metoprolol was added due to HCM, palpitations, and dyspnea.  She was noticing fewer palpitations.  She saw ENT at [facility] and was thinking about haivng the trach removed. She followed up with [name] Adas, PA on 08/2021. There were continued questions about stopping Eliquis due to chronic GI bleeding requiring transfusions. Eliquis was reduced to 2.5 mg to try to lower her bleeding risk.  Today, she is accompanied by a family member. Overall, she is feeling okay. However, she does endorse occasional chest pain. At night she experiences tingling in her bilateral hands. About a month ago Eliquis was reduced to 2.5 mg. She has noticed some blood with sputum production "every now and then". Otherwise there have not been any bleeding issues. Sometimes she will also have bilateral LE edema to her calf or lower, but has not needed to take extra doses of furosemide. She believes the edema will improve by morning and with elevation. She is trying to participate in formal exercise regularly as well as continuing to dance, and will stay active by trying to move around at home. She denies any palpitations, or shortness of breath. No lightheadedness, headaches, syncope, orthopnea, or PND.   Past Medical History:  Diagnosis Date   Anemia    Angioedema    2/2 ACE   Arteriovenous  malformation of stomach    Arthritis    Asthma    AVM (arteriovenous malformation) of colon    small intestine; stomach   Blood transfusion    CHF (congestive heart failure) (HCC)    Complete heart block (HCC)    s/p PPM 7510   Complication of anesthesia    Difficult airway; anaphylaxis and swelling with propofol   DDD (degenerative disc  disease)    Depression    Diastolic heart failure    Fatty liver 07/26/10   GERD (gastroesophageal reflux disease)    GI bleed    History of alcohol abuse Stopped Fall 2012   History of tobacco use Quit Fall 2012   Hx of cardiovascular stress test    a. Lexiscan Myoview (10/15):  Small inferolateral and apical defect c/w scar and poss soft tissue attenuation, no ischemia, EF 42%   Hx of colonic polyp 08/13/10   adenomatous   Hx of colonoscopy    Hyperlipidemia    Hypertension    Hypertrophic cardiomyopathy ([place])    dx by Dr [name] 2009   Iron deficiency anemia    Myocardial infarction [facility])    Pacemaker    Panic attacks    Permanent atrial fibrillation ([place])    Renal failure    baseline creatinine 1.6   Right arm pain 01/08/2012   RLS (restless legs syndrome)    Dx 06/2007   Sleep apnea    no cpap     Past Surgical History:  Procedure Laterality Date   BIOPSY  09/04/2020   Procedure: BIOPSY;  Surgeon: Mauri Pole, MD;  Location: [facility];  Service: Endoscopy;;   CARDIAC CATHETERIZATION     CARDIAC CATHETERIZATION N/A 08/19/2016   Procedure: Right/Left Heart Cath and Coronary Angiography;  Surgeon: Jolaine Artist, MD;  Location: [place] [facility];  Service: Cardiovascular;  Laterality: N/A;   COLONOSCOPY WITH PROPOFOL N/A 06/16/2017   Procedure: COLONOSCOPY WITH PROPOFOL;  Surgeon: Mauri Pole, MD;  [facility]: [facility];  Service: Endoscopy;  Laterality: N/A;   COLONOSCOPY WITH PROPOFOL N/A 09/04/2020   Procedure: COLONOSCOPY WITH PROPOFOL;  Surgeon: Mauri Pole, MD;  [facility];  Service: Endoscopy;  Laterality: N/A;   DIRECT LARYNGOSCOPY N/A 09/18/2016   Procedure: DIRECT LARYNGOSCOPY, BRONCHOSCOPY, REMOVAL OF INTUBATION GRANULOMA;  Surgeon: [name], MD;  Location: [facility] OR;  Service: ENT;  Laterality: N/A;   DIRECT LARYNGOSCOPY N/A 10/20/2016   Procedure: EXTUBATION AND FLEXIBLE LARYNGOSCOPE;  Surgeon: [name], MD;  Location:  [facility] OR;  Service: ENT;  Laterality: N/A;   DIRECT LARYNGOSCOPY N/A 10/29/2016   Procedure: DIRECT LARYNGOSCOPY;  Surgeon: [name], MD;  Location: [facility] OR;  Service: ENT;  Laterality: N/A;   ESOPHAGOGASTRODUODENOSCOPY  12/23/2011   Procedure: ESOPHAGOGASTRODUODENOSCOPY (EGD);  Surgeon: [name], MD;  [facility];  Service: Endoscopy;  Laterality: N/A;   ESOPHAGOGASTRODUODENOSCOPY (EGD) WITH PROPOFOL N/A 06/16/2017   Procedure: ESOPHAGOGASTRODUODENOSCOPY (EGD) WITH PROPOFOL;  Surgeon: Mauri Pole, MD;  [facility];  Service: Endoscopy;  Laterality: N/A;   ESOPHAGOGASTRODUODENOSCOPY (EGD) WITH PROPOFOL N/A 09/04/2020   Procedure: ESOPHAGOGASTRODUODENOSCOPY (EGD) WITH PROPOFOL;  Surgeon: Mauri Pole, MD;  [facility];  Service: Endoscopy;  Laterality: N/A;   EXTUBATION (ENDOTRACHEAL) IN OR N/A 07/21/2016   Procedure: EXTUBATION (ENDOTRACHEAL) IN OR;  Surgeon: [name], MD;  Location: [place];  Service: ENT;  Laterality: N/A;   [place]  12/23/2011   Procedure: GIVENS CAPSULE STUDY;  Surgeon: Sydell Axon  Andris Baumann, MD;  Location: [facility];  Service: Endoscopy;  Laterality: N/A;   HOT HEMOSTASIS N/A 09/04/2020   Procedure: HOT HEMOSTASIS (ARGON PLASMA COAGULATION/BICAP);  Surgeon: Mauri Pole, MD;  Location: [facility];  Service: Endoscopy;  Laterality: N/A;   MICROLARYNGOSCOPY WITH LASER N/A 10/16/2016   Procedure: MICRODIRECTLARYNGOSCOPY WITH LASER ABLATION AND KENLOG INJECTION;  Surgeon: [name], MD;  Location: [place];  Service: ENT;  Laterality: N/A;   [place], most recent gen change by [facility] 4/12   POLYPECTOMY  09/04/2020   Procedure: POLYPECTOMY;  Surgeon: Mauri Pole, MD;  [facility];  Service: Endoscopy;;   TRACHEOSTOMY TUBE PLACEMENT N/A 10/29/2016   Procedure: TRACHEOSTOMY;  Surgeon: [name], MD;  Location: [place];  Service: ENT;  Laterality: N/A;   TUBAL LIGATION   04/01/2000     Current Outpatient Medications  Medication Sig Dispense Refill   acetaminophen (TYLENOL) 650 MG CR tablet Take 650 mg by mouth every 8 (eight) hours as needed for pain.      albuterol (PROVENTIL HFA;VENTOLIN HFA) 108 (90 Base) MCG/ACT inhaler Inhale 1-2 puffs into the lungs every 6 (six) hours as needed for wheezing or shortness of breath. 1 Inhaler 6   allopurinol (ZYLOPRIM) 100 MG tablet TAKE 2 TABLETS(200 MG) BY MOUTH DAILY 180 tablet 3   ALPRAZolam (XANAX) 0.5 MG tablet Take 1 tablet (0.5 mg total) by mouth at bedtime as needed for anxiety. 30 tablet 0   apixaban (ELIQUIS) 2.5 MG TABS tablet Take 1 tablet (2.5 mg total) by mouth 2 (two) times daily. 60 tablet 1   bimatoprost (LUMIGAN) 0.01 % SOLN Place 1 drop into both eyes at bedtime.     brimonidine (ALPHAGAN) 0.2 % ophthalmic solution Place 1 drop into the right eye in the morning and at bedtime.     cetirizine (ZYRTEC) 10 MG tablet Take 10 mg by mouth daily as needed for allergies.      Epoetin Alfa-epbx (RETACRIT IJ) Inject 1 Dose as directed once a week.      ferrous sulfate 325 (65 FE) MG tablet TAKE 1 TABLET BY MOUTH TWICE DAILY WITH MEALS TO KEEP BLOOD COUNT UP 60 tablet 11   furosemide (LASIX) 20 MG tablet Take 1 tablet (20 mg total) by mouth daily. Daily as needed for weight gain of 2lbs in a day or 5lbs in a week 30 tablet 6   ipratropium-albuterol (DUONEB) 0.5-2.5 (3) MG/3ML SOLN USE 3 ML VIA NEBULIZER EVERY 6 HOURS 360 mL 0   latanoprost (XALATAN) 0.005 % ophthalmic solution Place 1 drop into both eyes at bedtime.     metoprolol tartrate (LOPRESSOR) 25 MG tablet Take 1 tablet (25 mg total) by mouth 2 (two) times daily. 60 tablet 5   mirtazapine (REMERON) 7.5 MG tablet Take 7.5 mg by mouth at bedtime.     nitroGLYCERIN (NITROSTAT) 0.4 MG SL tablet Place 1 tablet (0.4 mg total) under the tongue every 5 (five) minutes as needed for chest pain. 25 tablet 1   Olopatadine HCl 0.2 % SOLN Place 1-2 drops into both eyes  daily as needed (itching).   1   oxymetazoline (AFRIN NASAL SPRAY) 0.05 % nasal spray Place 1 spray into both nostrils 2 (two) times daily. 30 mL 0   potassium chloride (K-DUR) 10 MEQ tablet Take 1 tablet (10 mEq total) by mouth daily. 90 tablet 0   rosuvastatin (CRESTOR) 20 MG tablet Take 1 tablet (20 mg total) by  mouth at bedtime. 90 tablet 3   sodium chloride (OCEAN) 0.65 % SOLN nasal spray Place 1 spray into both nostrils daily as needed for congestion.      STIMULANT LAXATIVE 8.6-50 MG tablet Take 2 tablets by mouth at bedtime as needed for mild constipation.      No current facility-administered medications for this visit.    Allergies:   Ace inhibitors, Other, and Propofol    Social History:  The patient  reports that she quit smoking about 10 years ago. Her smoking use included cigarettes. She has a 5.00 pack-year smoking history. She has never used smokeless tobacco. She reports that she does not drink alcohol and does not use drugs.   Family History:  The patient's family history includes Alcohol abuse in her brother and father; Aneurysm in her mother; CVA in her mother; Cancer in her brother; Colon cancer in her brother; Diabetes in her brother and sister; Heart attack in her brother, brother, and father; Heart disease in her daughter, mother, and sister; Hypertension in her brother, father, mother, and sister; Stroke in her father and mother.    ROS:  Please see the history of present illness. (+) Chest pain (+) Tingling sensation, bilateral hands (+) Sputum production, occasionally with bleeding (+) Bilateral LE edema All other systems are reviewed and negative.    PHYSICAL EXAM: VS:  BP 110/68   Pulse 70   Ht 5\' 2"  (1.575 m)   Wt 128 lb (58.1 kg)   BMI 23.41 kg/m  , BMI Body mass index is 23.41 kg/m. GENERAL:  Well-appearing HEENT:  Pupils equal round and reactive, fundi not visualized, oral mucosa unremarkable.   NECK:  No jugular venous distention, waveform within  normal limits, carotid upstroke brisk and symmetric, no bruits.  Trach in place. LUNGS:  Clear to auscultation bilaterally HEART:  RRR.  PMI not displaced or sustained,S1 and S2 within normal limits, no S3, no S4, no clicks, no rubs, II/VI holosystolic murmur at the apex ABD:  Flat, positive bowel sounds normal in frequency in pitch, no bruits, no rebound, no guarding, no midline pulsatile mass, no hepatomegaly, no splenomegaly EXT:  2 plus pulses throughout, no edema, no cyanosis no clubbing SKIN:  No rashes no nodules NEURO:  Cranial nerves II through XII grossly intact, motor grossly intact throughout [facility]:  Cognitively intact, oriented to person place and time  EKG:   09/09/2021: A-sensed, V-paced. Rate 70 bpm. 10/04/2020: EKG was not ordered. 01/25/16: ventricularly paced at 70 bpm. Likely atrial fibrillation.  Echo 05/09/19:  1. The left ventricle has hyperdynamic systolic function, with an ejection fraction of >65%. The cavity size was normal. There is severely increased left ventricular wall thickness. Findings are consistent with hypertrophic cardiomyopathy. Left  ventricular diastolic Doppler parameters are consistent with pseudonormalization.  2. The right ventricle has normal systolic function. The cavity was normal. There is no increase in right ventricular wall thickness.  3. Left atrial size was moderately dilated.  4. Right atrial size was moderately dilated.  5. Mitral valve regurgitation is moderate by color flow Doppler.  6. The aortic valve is tricuspid. Mild thickening of the aortic valve. Mild calcification of the aortic valve.  ICD Interrogation 01/25/16: St. Just single chamber pacemaker Battery 2.78 V Presenting rhythm is atrial fibrillation Intermittently paced at 70 bpm. Underlying rhythm atrial fibrillation with complete heart block Threshold: 1V @ 0.90ms Sensing: not performed due to complete heart block. Impedance: 436 Ohms  No events noted  Echo  11/26/15: Study Conclusions  - Left ventricle: The cavity size was normal. There was severe   concentric hypertrophy. Systolic function was vigorous. The   estimated ejection fraction was in the range of 65% to 70%. Wall   motion was normal; there were no regional wall motion   abnormalities. Features are consistent with a pseudonormal left   ventricular filling pattern, with concomitant abnormal relaxation   and increased filling pressure (grade 2 diastolic dysfunction). - Aortic valve: Trileaflet; normal thickness, mildly calcified   leaflets. - Mitral valve: There was trivial regurgitation. - Left atrium: The atrium was mildly dilated. - Atrial septum: There was increased thickness of the septum,   consistent with lipomatous hypertrophy. - Tricuspid valve: There was trivial regurgitation.   Recent Labs: 10/18/2020: ALT 15; BUN 30; Creatinine, Ser 1.77; Potassium 3.9; Sodium 142 01/07/2021: Hemoglobin 8.1    Lipid Panel    Component Value Date/Time   CHOL 151 10/18/2020 1102   TRIG 114 10/18/2020 1102   HDL 55 10/18/2020 1102   CHOLHDL 2.7 10/18/2020 1102   CHOLHDL 2.3 05/09/2019 0402   VLDL 20 05/09/2019 0402   LDLCALC 76 10/18/2020 1102   LDLDIRECT 161.8 02/21/2013 1416      Wt Readings from Last 3 Encounters:  09/09/21 128 lb (58.1 kg)  09/03/21 127 lb 4.8 oz (57.7 kg)  07/23/21 128 lb (58.1 kg)      ASSESSMENT AND PLAN: Hypertrophic cardiomyopathy ([place]) She is euvolemic and doing well.  BP well-controlled.  Continue metoprolol and lasix.  She also has moderate mitral regurgitation.  It has been a couple years since she had an echo.  She will come back for an echocardiogram to assess for mitral regurgitation, ventricular hypertrophy and outflow tract obstruction.  Permanent atrial fibrillation ([place]) She is on reduced dose of Eliquis due to recurrent bleeding from AVMs and at least monthly transfusions.  Her This patients CHA2DS2-VASc Score and unadjusted Ischemic  Stroke Rate (% per year) is equal to 10.8 % stroke rate/year from a score of 8  Above score calculated as 1 point each if present [CHF, HTN, DM, Vascular=MI/PAD/Aortic Plaque, Age if 65-74, or Female] Above score calculated as 2 points each if present [Age > 75, or Stroke/TIA/TE] There have been many conversations about her stroke risk versus bleeding risk.  For now, continue Eliquis at reduced dose.  She previously underwent an attempt at work-up for a watchman device, which is when she had a complication with laryngeal stenosis requiring tracheotomy.  Therefore, would not consider repursuing this option.  Continue Eliquis and metoprolol.   Complete heart block ([place]) Status post pacemaker.  She is followed by Dr. Rayann [name].  Hyperlipidemia Continue rosuvastatin.  Lipids are well-controlled.  She will come back for fasting lipids and a CMP.   Current medicines are reviewed at length with the patient today.  The patient does not have concerns regarding medicines.  The following changes have been made:  none  Labs/ tests ordered today include:   Orders Placed This Encounter  Procedures   Lipid panel   Comprehensive metabolic panel   EKG 30-QMVH   ECHOCARDIOGRAM COMPLETE     Disposition:   FU with Lynniah Janoski C. Oval Linsey, MD, [facility] in 6 months  I,Mathew Stumpf,acting as a Education administrator for [name] Latch, MD.,have documented all relevant documentation on the behalf of [name] Latch, MD,as directed by  [name] Latch, MD while in the presence of [name] Latch, MD.  I, [place] Oval Linsey, MD have reviewed all documentation for this  visit.  The documentation of the exam, diagnosis, procedures, and orders on 09/09/2021 are all accurate and complete.   Signed, Jaxson Keener C. Oval Linsey, MD, [facility]  09/09/2021 11:34 AM    [place]

## 2021-09-09 ENCOUNTER — Other Ambulatory Visit: Payer: Self-pay

## 2021-09-09 ENCOUNTER — Ambulatory Visit (INDEPENDENT_AMBULATORY_CARE_PROVIDER_SITE_OTHER): Payer: Medicare (Managed Care) | Admitting: Cardiovascular Disease

## 2021-09-09 ENCOUNTER — Telehealth: Payer: Self-pay | Admitting: Physician Assistant

## 2021-09-09 ENCOUNTER — Encounter (HOSPITAL_BASED_OUTPATIENT_CLINIC_OR_DEPARTMENT_OTHER): Payer: Self-pay | Admitting: Cardiovascular Disease

## 2021-09-09 ENCOUNTER — Telehealth: Payer: Self-pay

## 2021-09-09 VITALS — BP 110/68 | HR 70 | Ht 62.0 in | Wt 128.0 lb

## 2021-09-09 DIAGNOSIS — I422 Other hypertrophic cardiomyopathy: Secondary | ICD-10-CM | POA: Diagnosis not present

## 2021-09-09 DIAGNOSIS — Z5181 Encounter for therapeutic drug level monitoring: Secondary | ICD-10-CM

## 2021-09-09 DIAGNOSIS — I4821 Permanent atrial fibrillation: Secondary | ICD-10-CM

## 2021-09-09 DIAGNOSIS — E7800 Pure hypercholesterolemia, unspecified: Secondary | ICD-10-CM | POA: Diagnosis not present

## 2021-09-09 DIAGNOSIS — I10 Essential (primary) hypertension: Secondary | ICD-10-CM | POA: Diagnosis not present

## 2021-09-09 DIAGNOSIS — I442 Atrioventricular block, complete: Secondary | ICD-10-CM

## 2021-09-09 NOTE — Patient Instructions (Addendum)
Medication Instructions:  Your physician recommends that you continue on your current medications as directed. Please refer to the Current Medication list given to you today.   *If you need a refill on your cardiac medications before your next appointment, please call your pharmacy*   Lab Work: FASTING LP/CMET WHEN YOU RETURN FOR ECHO   If you have labs (blood work) drawn today and your tests are completely normal, you will receive your results only by: [place] (if you have MyChart) OR A paper copy in the mail If you have any lab test that is abnormal or we need to change your treatment, we will call you to review the results.   Testing/Procedures: Your physician has requested that you have an echocardiogram. Echocardiography is a painless test that uses sound waves to create images of your heart. It provides your doctor with information about the size and shape of your heart and how well your heart's chambers and valves are working. This procedure takes approximately one hour. There are no restrictions for this procedure.  MORNING APPOINMTMENT    Follow-Up: At [facility], you and your health needs are our priority.  As part of our continuing mission to provide you with exceptional heart care, we have created designated Provider Care Teams.  These Care Teams include your primary Cardiologist (physician) and Advanced Practice Providers (APPs -  Physician Assistants and Nurse Practitioners) who all work together to provide you with the care you need, when you need it.  We recommend signing up for the patient portal called "MyChart".  Sign up information is provided on this After Visit Summary.  MyChart is used to connect with patients for Virtual Visits (Telemedicine).  Patients are able to view lab/test results, encounter notes, upcoming appointments, etc.  Non-urgent messages can be sent to your provider as well.   To learn more about what you can do with MyChart, go to  [email or web address].    Your next appointment:   6 month(s)  The format for your next appointment:   In Person  Provider:   Skeet Latch, MD or Laurann Montana, NP   Other Instructions  YOU ARE CLEARED FROM A CARDIAC STANDPOINT TO HAVE YOUR UPCOMING PROCEDURE  OK TO HOLD YOUR ELIQUIS 3 DAYS PRIOR

## 2021-09-09 NOTE — Assessment & Plan Note (Addendum)
She is euvolemic and doing well.  BP well-controlled.  Continue metoprolol and lasix.  She also has moderate mitral regurgitation.  It has been a couple years since she had an echo.  She will come back for an echocardiogram to assess for mitral regurgitation, ventricular hypertrophy and outflow tract obstruction.

## 2021-09-09 NOTE — Telephone Encounter (Signed)
Dr [name] Please advise. Can we go forward with the balloon enteroscopy with a one day hold on Eliquis? Patient has IDA and AVM. She is receiving frequent transfusions. Actually a patient of Dr Woodward Ku.

## 2021-09-09 NOTE — Telephone Encounter (Signed)
Hi [name]! Can you help confirm if a 1 day hold is acceptable? We are also awaiting input from [name] about whether she needs echo completed prior to clearing.[name]

## 2021-09-09 NOTE — Telephone Encounter (Signed)
[facility] Pre-operative Risk Assessment     Request for surgical clearance:     Endoscopy Procedure  What type of surgery is being performed?     Enteroscopy   When is this surgery scheduled?     09/30/21  What type of clearance is required ?   Pharmacy  Are there any medications that need to be held prior to surgery and how long? Eliquis to be held for 2 days prior to procedure  Practice name and name of physician performing surgery?      [facility] Dr [name] What is your office phone and fax number?      Phone- [phone]  [phone]  Anesthesia type (None, local, MAC, general) ?       MAC

## 2021-09-09 NOTE — Assessment & Plan Note (Signed)
Continue rosuvastatin.  Lipids are well-controlled.  She will come back for fasting lipids and a CMP.

## 2021-09-09 NOTE — Assessment & Plan Note (Signed)
Status post pacemaker.  She is followed by Dr. Rayann Heman.

## 2021-09-09 NOTE — Telephone Encounter (Signed)
Covering preop today. Patient just saw [name] today at [facility] - felt to be doing well. 2D echo planned, scheduled for 09/25/21. Endoscopy date is 09/30/21. Will route to [name] to inquire whether we may clear her at this time or if we need to wait until echo results. [name] - Please route response to P CV DIV PREOP (the pre-op pool). Thank you.  Will also route to pharm for anticoag input.

## 2021-09-09 NOTE — Telephone Encounter (Signed)
Patient with diagnosis of afib on Eliquis for anticoagulation.    Procedure: enteroscopy Date of procedure: 09/30/21  CHA2DS2-VASc Score = 8  This indicates a 10.8% annual risk of stroke. The patient's score is based upon: CHF History: 1 HTN History: 1 Diabetes History: 0 Stroke History: 2 Vascular Disease History: 1 Age Score: 2 Gender Score: 1   CrCl 32mL/min Platelet count 410K  Would prefer 1 day Eliquis hold prior to procedure due to increased cardiovascular risk. If 2 day hold is required, will need MD clearance.

## 2021-09-09 NOTE — Assessment & Plan Note (Signed)
She is on reduced dose of Eliquis due to recurrent bleeding from AVMs and at least monthly transfusions.  Her This patients CHA2DS2-VASc Score and unadjusted Ischemic Stroke Rate (% per year) is equal to 10.8 % stroke rate/year from a score of 8  Above score calculated as 1 point each if present [CHF, HTN, DM, Vascular=MI/PAD/Aortic Plaque, Age if 65-74, or Female] Above score calculated as 2 points each if present [Age > 75, or Stroke/TIA/TE] There have been many conversations about her stroke risk versus bleeding risk.  For now, continue Eliquis at reduced dose.  She previously underwent an attempt at work-up for a watchman device, which is when she had a complication with laryngeal stenosis requiring tracheotomy.  Therefore, would not consider repursuing this option.  Continue Eliquis and metoprolol.

## 2021-09-10 ENCOUNTER — Telehealth: Payer: Self-pay

## 2021-09-10 NOTE — Telephone Encounter (Signed)
    Patient Name: [name]  DOB: Apr 16, 1946 MRN: [id number]  Primary Cardiologist: Skeet Latch, MD  Chart reviewed as part of pre-operative protocol coverage. Given past medical history and time since last visit, based on ACC/AHA guidelines, [name] would be at acceptable risk for the planned procedure without further cardiovascular testing.   Patient with diagnosis of afib on Eliquis for anticoagulation.     Procedure: enteroscopy Date of procedure: 09/30/21   CHA2DS2-VASc Score = 8  This indicates a 10.8% annual risk of stroke. The patient's score is based upon: CHF History: 1 HTN History: 1 Diabetes History: 0 Stroke History: 2 Vascular Disease History: 1 Age Score: 2 Gender Score: 1   CrCl 26mL/min Platelet count 410K   Initial plan was for a 1 day hold however this was re-examined by [name] and Dr. [name] who have agreed that a 2 day Eliquis hold will be reasonable. Despite a high CHA2DS2-VASC score plan was to not bridge her given her significant bleeding risk.   The patient was advised that if she develops new symptoms prior to surgery to contact our office to arrange for a follow-up visit, and she verbalized understanding.  Per GI team notes, the patient has been informed however I will route this recommendation to the requesting party via Epic fax function and remove from pre-op pool.  Please call with questions.  Kathyrn Drown, NP 09/10/2021, 1:14 PM

## 2021-09-10 NOTE — Telephone Encounter (Signed)
Daughter is advised. Written instructions mailed to her and faxed to the nurse at [facility].

## 2021-09-10 NOTE — Telephone Encounter (Signed)
Thanks for the follow up.  [name] can you clarify that this procedure is only a standard enteroscopy. Only [facility] and [facility] do balloon enteroscopy and we would need to reschedule with them if that is the case. From my review of the notes I think only standard enteroscopy is planned.  [name] given her CrCl and nature of the procedure planned for AVM ablation, she is at risk for bleeding with this procedure. Would it be acceptable to hold Eliquis for 2 days to reduce her bleeding risk? That would be preferred given her low CrCl. Thank you

## 2021-09-10 NOTE — Progress Notes (Signed)
[facility] Note  09/11/2021     CHIEF COMPLAINT Patient presents for Retina Follow Up   HISTORY OF PRESENT ILLNESS: [name] is a 75 y.o. female who presents to the clinic today for:   HPI     Retina Follow Up   Patient presents with  Other.  In both eyes.  This started months ago.  Severity is moderate.  Duration of months.  Since onset it is stable.  I, the attending physician,  performed the HPI with the patient and updated documentation appropriately.        Comments   Pt states vision is the same OU.  Pt denies eye pain or discomfort and denies any new or worsening floaters or fol OU.      Last edited by [name], MD on 09/12/2021  2:24 PM.     Referring physician: [name], MD [place],  [place] [address]  HISTORICAL INFORMATION:   Selected notes from the MEDICAL RECORD NUMBER Referred by Dr. Herbert Deaner for ret eval   CURRENT MEDICATIONS: Current Outpatient Medications (Ophthalmic Drugs)  Medication Sig   bimatoprost (LUMIGAN) 0.01 % SOLN Place 1 drop into both eyes at bedtime.   brimonidine (ALPHAGAN) 0.2 % ophthalmic solution Place 1 drop into the right eye in the morning and at bedtime.   latanoprost (XALATAN) 0.005 % ophthalmic solution Place 1 drop into both eyes at bedtime.   Olopatadine HCl 0.2 % SOLN Place 1-2 drops into both eyes daily as needed (itching).    No current facility-administered medications for this visit. (Ophthalmic Drugs)   Current Outpatient Medications (Other)  Medication Sig   acetaminophen (TYLENOL) 650 MG CR tablet Take 650 mg by mouth every 8 (eight) hours as needed for pain.    albuterol (PROVENTIL HFA;VENTOLIN HFA) 108 (90 Base) MCG/ACT inhaler Inhale 1-2 puffs into the lungs every 6 (six) hours as needed for wheezing or shortness of breath.   allopurinol (ZYLOPRIM) 100 MG tablet TAKE 2 TABLETS(200 MG) BY MOUTH DAILY   ALPRAZolam (XANAX) 0.5 MG tablet Take 1  tablet (0.5 mg total) by mouth at bedtime as needed for anxiety.   apixaban (ELIQUIS) 2.5 MG TABS tablet Take 1 tablet (2.5 mg total) by mouth 2 (two) times daily.   cetirizine (ZYRTEC) 10 MG tablet Take 10 mg by mouth daily as needed for allergies.    Epoetin Alfa-epbx (RETACRIT IJ) Inject 1 Dose as directed once a week.    ferrous sulfate 325 (65 FE) MG tablet TAKE 1 TABLET BY MOUTH TWICE DAILY WITH MEALS TO KEEP BLOOD COUNT UP   furosemide (LASIX) 20 MG tablet Take 1 tablet (20 mg total) by mouth daily. Daily as needed for weight gain of 2lbs in a day or 5lbs in a week   ipratropium-albuterol (DUONEB) 0.5-2.5 (3) MG/3ML SOLN USE 3 ML VIA NEBULIZER EVERY 6 HOURS   metoprolol tartrate (LOPRESSOR) 25 MG tablet Take 1 tablet (25 mg total) by mouth 2 (two) times daily.   mirtazapine (REMERON) 7.5 MG tablet Take 7.5 mg by mouth at bedtime.   nitroGLYCERIN (NITROSTAT) 0.4 MG SL tablet Place 1 tablet (0.4 mg total) under the tongue every 5 (five) minutes as needed for chest pain.   oxymetazoline (AFRIN NASAL SPRAY) 0.05 % nasal spray Place 1 spray into both nostrils 2 (two) times daily.   potassium chloride (K-DUR) 10 MEQ tablet Take 1 tablet (10 mEq total) by mouth daily.   rosuvastatin (CRESTOR) 20  MG tablet Take 1 tablet (20 mg total) by mouth at bedtime.   sodium chloride (OCEAN) 0.65 % SOLN nasal spray Place 1 spray into both nostrils daily as needed for congestion.    STIMULANT LAXATIVE 8.6-50 MG tablet Take 2 tablets by mouth at bedtime as needed for mild constipation.    No current facility-administered medications for this visit. (Other)      REVIEW OF SYSTEMS: ROS   Positive for: Skin, Genitourinary, Musculoskeletal, Cardiovascular, Eyes, Respiratory, Psychiatric, Allergic/Imm Negative for: Constitutional, Gastrointestinal, Neurological, HENT, Endocrine, Heme/Lymph Last edited by Doneen Poisson on 09/11/2021  1:39 PM.     ALLERGIES Allergies  Allergen Reactions   Ace Inhibitors  Swelling    angiodema   Other Anaphylaxis and Swelling    Reaction to unspecified anesthesia    Propofol     Dysrhythmia likely to drop in afterload post induction with Propofol. Did not have anaphylactic symptoms with MAC in 2018   PAST MEDICAL HISTORY Past Medical History:  Diagnosis Date   Anemia    Angioedema    2/2 ACE   Arteriovenous malformation of stomach    Arthritis    Asthma    AVM (arteriovenous malformation) of colon    small intestine; stomach   Blood transfusion    CHF (congestive heart failure) (HCC)    Complete heart block (HCC)    s/p PPM 5366   Complication of anesthesia    Difficult airway; anaphylaxis and swelling with propofol   DDD (degenerative disc disease)    Depression    Diastolic heart failure    Fatty liver 07/26/10   GERD (gastroesophageal reflux disease)    GI bleed    History of alcohol abuse Stopped Fall 2012   History of tobacco use Quit Fall 2012   Hx of cardiovascular stress test    a. Lexiscan Myoview (10/15):  Small inferolateral and apical defect c/w scar and poss soft tissue attenuation, no ischemia, EF 42%   Hx of colonic polyp 08/13/10   adenomatous   Hx of colonoscopy    Hyperlipidemia    Hypertension    Hypertrophic cardiomyopathy ([place])    dx by Dr [name] 2009   Iron deficiency anemia    Myocardial infarction [facility])    Pacemaker    Panic attacks    Permanent atrial fibrillation ([place])    Renal failure    baseline creatinine 1.6   Right arm pain 01/08/2012   RLS (restless legs syndrome)    Dx 06/2007   Sleep apnea    no cpap    Past Surgical History:  Procedure Laterality Date   BIOPSY  09/04/2020   Procedure: BIOPSY;  Surgeon: Mauri Pole, MD;  Location: [facility];  Service: Endoscopy;;   CARDIAC CATHETERIZATION     CARDIAC CATHETERIZATION N/A 08/19/2016   Procedure: Right/Left Heart Cath and Coronary Angiography;  Surgeon: Jolaine Artist, MD;  Location: [facility];  Service: Cardiovascular;   Laterality: N/A;   COLONOSCOPY WITH PROPOFOL N/A 06/16/2017   Procedure: COLONOSCOPY WITH PROPOFOL;  Surgeon: Mauri Pole, MD;  Location: [facility];  Service: Endoscopy;  Laterality: N/A;   COLONOSCOPY WITH PROPOFOL N/A 09/04/2020   Procedure: COLONOSCOPY WITH PROPOFOL;  Surgeon: Mauri Pole, MD;  Location: [facility];  Service: Endoscopy;  Laterality: N/A;   DIRECT LARYNGOSCOPY N/A 09/18/2016   Procedure: DIRECT LARYNGOSCOPY, BRONCHOSCOPY, REMOVAL OF INTUBATION GRANULOMA;  Surgeon: [name], MD;  Location: [facility];  Service: ENT;  Laterality: N/A;  DIRECT LARYNGOSCOPY N/A 10/20/2016   Procedure: EXTUBATION AND FLEXIBLE LARYNGOSCOPE;  Surgeon: [name], MD;  Location: [facility];  Service: ENT;  Laterality: N/A;   DIRECT LARYNGOSCOPY N/A 10/29/2016   Procedure: DIRECT LARYNGOSCOPY;  Surgeon: [name], MD;  Location: [facility] OR;  Service: ENT;  Laterality: N/A;   ESOPHAGOGASTRODUODENOSCOPY  12/23/2011   Procedure: ESOPHAGOGASTRODUODENOSCOPY (EGD);  Surgeon: [name], MD;  Location: [facility];  Service: Endoscopy;  Laterality: N/A;   ESOPHAGOGASTRODUODENOSCOPY (EGD) WITH PROPOFOL N/A 06/16/2017   Procedure: ESOPHAGOGASTRODUODENOSCOPY (EGD) WITH PROPOFOL;  Surgeon: Mauri Pole, MD;  [facility];  Service: Endoscopy;  Laterality: N/A;   ESOPHAGOGASTRODUODENOSCOPY (EGD) WITH PROPOFOL N/A 09/04/2020   Procedure: ESOPHAGOGASTRODUODENOSCOPY (EGD) WITH PROPOFOL;  Surgeon: Mauri Pole, MD;  [facility];  Service: Endoscopy;  Laterality: N/A;   EXTUBATION (ENDOTRACHEAL) IN OR N/A 07/21/2016   Procedure: EXTUBATION (ENDOTRACHEAL) IN OR;  Surgeon: [name], MD;  Location: [facility];  Service: ENT;  Laterality: N/A;   [place] STUDY  12/23/2011   Procedure: GIVENS CAPSULE STUDY;  Surgeon: [name], MD;  [facility];  Service: Endoscopy;  Laterality: N/A;   HOT HEMOSTASIS N/A 09/04/2020   Procedure: HOT HEMOSTASIS (ARGON PLASMA  COAGULATION/BICAP);  Surgeon: Mauri Pole, MD;  Location: [facility];  Service: Endoscopy;  Laterality: N/A;   MICROLARYNGOSCOPY WITH LASER N/A 10/16/2016   Procedure: FBPZWCHENIDPOEUMPNTIRWE WITH LASER ABLATION AND KENLOG INJECTION;  Surgeon: [name], MD;  Location: [facility] OR;  Service: ENT;  Laterality: N/A;   [place], most recent gen change by [facility] 4/12   POLYPECTOMY  09/04/2020   Procedure: POLYPECTOMY;  Surgeon: Mauri Pole, MD;  [facility];  Service: Endoscopy;;   TRACHEOSTOMY TUBE PLACEMENT N/A 10/29/2016   Procedure: TRACHEOSTOMY;  Surgeon: [name], MD;  Location: [facility];  Service: ENT;  Laterality: N/A;   TUBAL LIGATION  04/01/2000   FAMILY HISTORY Family History  Problem Relation Age of Onset   Hypertension Mother    Stroke Mother    Heart disease Mother    Aneurysm Mother    CVA Mother    Hypertension Father    Stroke Father    Heart attack Father    Alcohol abuse Father    Hypertension Sister    Hypertension Brother    Colon cancer Brother    Cancer Brother    Diabetes Sister    Diabetes Brother    Heart attack Brother    Heart attack Brother    Heart disease Sister    Alcohol abuse Brother    Heart disease Daughter    Esophageal cancer Neg Hx    Stomach cancer Neg Hx    Rectal cancer Neg Hx    SOCIAL HISTORY Social History   Tobacco Use   Smoking status: Former    Packs/day: 0.10    Years: 50.00    Pack years: 5.00    Types: Cigarettes    Quit date: 08/16/2011    Years since quitting: 10.0   Smokeless tobacco: Never   Tobacco comments:    quit 2012  Vaping Use   Vaping Use: Never used  Substance Use Topics   Alcohol use: No    Comment: quit 08/16/2011   Drug use: No       OPHTHALMIC EXAM:  Base Eye Exam     Visual Acuity (Snellen - Linear)       Right Left   Dist [place]  20/20 -2 20/40 -2   Dist ph [place]  20/30 +1    Correction: Glasses         Tonometry (Tonopen, 1:47 PM)        Right Left   Pressure 09 11         Pupils       Dark Light Shape React APD   Right 3 2 Round Brisk 0   Left 3 2 Round Brisk 0         Visual Fields       Left Right    Full Full         Extraocular Movement       Right Left    Full Full         Neuro/Psych     Oriented x3: Yes   Mood/Affect: Normal         Dilation     Both eyes: 1.0% Mydriacyl, 2.5% Phenylephrine @ 1:47 PM           Slit Lamp and Fundus Exam     Slit Lamp Exam       Right Left   Lids/Lashes Dermatochalasis - upper lid Dermatochalasis - upper lid   Conjunctiva/Sclera Melanosis Melanosis, temporal pinguecula   Cornea arcus, 1-2+ Punctate epithelial erosions, well healed ST cataract wounds arcus, 1+Punctate epithelial erosions, well healed ST cataract wounds   Anterior Chamber deep and clear Deep and clear   Iris Round and poorly dilated to 4.0, mild focal atrophy at 1030, no NVI round and poorly dilated to 3.58m   Lens PC IOL in good position PC IOL in good position, trace Posterior capsular opacification   Vitreous Mild Vitreous syneresis, PVD, Weiss ring Mild Vitreous syneresis         Fundus Exam       Right Left   Disc mild Pallor, Sharp rim, +PPA mild Pallor, Sharp rim, 360 PPA/PPP, +cupping   C/D Ratio 0.5 0.6   Macula Flat, Blunted foveal reflex, mild RPE mottling, trace ERM Flat, Blunted foveal reflex, mild RPE mottling, trace ERM   Vessels Vascular attenuation, Tortuous---arterioles greater than venoules, mild Copper wiring, mild AV crossing changes Vascular attenuation, Tortuous--arterioles greater than venoules, Copper wiring, macro aneurysms and beading along ST arteriole -- no heme or edema   Periphery Attached, No heme  Attached, No heme             IMAGING AND PROCEDURES  Imaging and Procedures for 09/11/2021  OCT, Retina - OU - Both Eyes       Right Eye Quality was good. Central Foveal Thickness: 235. Progression has been stable. Findings include  normal foveal contour, no IRF, no SRF (focal druse nasal macula, mild ERM).   Left Eye Quality was good. Central Foveal Thickness: 233. Progression has been stable. Findings include abnormal foveal contour, no IRF, no SRF (Foveal notch nasal fovea, partial PVD).   Notes *Images captured and stored on drive  Diagnosis / Impression:  NFP, no IRF/SRF OU  OD: focal druse nasal macula, mild ERM  Clinical management:  See below  Abbreviations: NFP - Normal foveal profile. CME - cystoid macular edema. PED - pigment epithelial detachment. IRF - intraretinal fluid. SRF - subretinal fluid. EZ - ellipsoid zone. ERM - epiretinal membrane. ORA - outer retinal atrophy. ORT - outer retinal tubulation. SRHM - subretinal hyper-reflective material. IRHM - intraretinal hyper-reflective material            ASSESSMENT/PLAN:    ICD-10-CM  1. Hypertensive retinopathy of both eyes  H35.033     2. Essential hypertension  I10     3. Retinal edema  H35.81 OCT, Retina - OU - Both Eyes    4. Posterior vitreous detachment of right eye  H43.811     5. Pseudophakia, both eyes  Z96.1     6. Primary open angle glaucoma of both eyes, unspecified glaucoma stage  H40.1130       1,2. Hypertensive retinopathy OU  - exam shows 3+ tortuous vessels OU + OS with beading / early macroaneurysms of sup temp arterioles  - no frank heme or surrounding edema  - FA 10.29.21 shows mild peripheral vascular nonperfusion OU, OS with mild perivascular leakage  - BCVA stable today, 20/20 OD, 20/30 OS - discussed importance of tight BP control - monitor - f/u 6-9 months, sooner prn -- DFE/OCT  3. No retinal edema on exam or OCT  4. PVD OD  - interval development of PVD OD on 10.29.21 visit  - OCT OD shows stable release of partial PVD from disc  - Discussed findings and prognosis  - No RT or RD on 360 peripheral exam  - Reviewed s/s of RT/RD  - Strict return precautions for any such RT/RD signs/symptoms  - f/u in  6 months -- DFE/OCT  5. Pseudophakia OU  - s/p CE/IOL OU (Dr. Herbert Deaner, OS 6.1.21, OD 9.7.21)  - IOLs in good position, doing well  - monitor  6. Glaucoma  - under the expert management of Dr. Herbert Deaner  - IOP today 09,11  - on latanoprost QHS   Ophthalmic Meds Ordered this visit:  No orders of the defined types were placed in this encounter.      Return for f/u 6-9 months, HTN ret OU, DFE, OCT.  There are no Patient Instructions on file for this visit.  This document serves as a record of services personally performed by Gardiner Sleeper, MD, PhD. It was created on their behalf by Orvan Falconer, an ophthalmic technician. The creation of this record is the provider's dictation and/or activities during the visit.    Electronically signed by: Orvan Falconer, OA, 09/12/21  2:30 PM   Gardiner Sleeper, M.D., Ph.D. Diseases & Surgery of the Retina and Vitreous [facility]  I have reviewed the above documentation for accuracy and completeness, and I agree with the above. Gardiner Sleeper, M.D., Ph.D. 09/12/21 2:30 PM   Abbreviations: M myopia (nearsighted); A astigmatism; H hyperopia (farsighted); P presbyopia; Mrx spectacle prescription;  CTL contact lenses; OD right eye; OS left eye; OU both eyes  XT exotropia; ET esotropia; PEK punctate epithelial keratitis; PEE punctate epithelial erosions; DES dry eye syndrome; MGD meibomian gland dysfunction; ATs artificial tears; PFAT's preservative free artificial tears; [place] nuclear sclerotic cataract; PSC posterior subcapsular cataract; ERM epi-retinal membrane; PVD posterior vitreous detachment; RD retinal detachment; DM diabetes mellitus; DR diabetic retinopathy; NPDR non-proliferative diabetic retinopathy; PDR proliferative diabetic retinopathy; CSME clinically significant macular edema; DME diabetic macular edema; dbh dot blot hemorrhages; CWS cotton wool spot; POAG primary open angle glaucoma; C/D cup-to-disc ratio; HVF  humphrey visual field; GVF goldmann visual field; OCT optical coherence tomography; IOP intraocular pressure; BRVO Branch retinal vein occlusion; CRVO central retinal vein occlusion; CRAO central retinal artery occlusion; BRAO branch retinal artery occlusion; RT retinal tear; SB scleral buckle; PPV pars plana vitrectomy; VH Vitreous hemorrhage; PRP panretinal laser photocoagulation; IVK intravitreal kenalog; VMT vitreomacular traction; MH Macular hole;  NVD  neovascularization of the disc; NVE neovascularization elsewhere; AREDS age related eye disease study; ARMD age related macular degeneration; POAG primary open angle glaucoma; EBMD epithelial/anterior basement membrane dystrophy; ACIOL anterior chamber intraocular lens; IOL intraocular lens; PCIOL posterior chamber intraocular lens; Phaco/IOL phacoemulsification with intraocular lens placement; [place] photorefractive keratectomy; LASIK laser assisted in situ keratomileusis; HTN hypertension; DM diabetes mellitus; COPD chronic obstructive pulmonary disease

## 2021-09-10 NOTE — Telephone Encounter (Signed)
Instructions printed and faxed to [facility]. Daughter is aware of the date for the procedure.

## 2021-09-11 ENCOUNTER — Other Ambulatory Visit: Payer: Self-pay

## 2021-09-11 ENCOUNTER — Ambulatory Visit (INDEPENDENT_AMBULATORY_CARE_PROVIDER_SITE_OTHER): Payer: Medicare (Managed Care) | Admitting: Ophthalmology

## 2021-09-11 DIAGNOSIS — I10 Essential (primary) hypertension: Secondary | ICD-10-CM | POA: Diagnosis not present

## 2021-09-11 DIAGNOSIS — H35033 Hypertensive retinopathy, bilateral: Secondary | ICD-10-CM

## 2021-09-11 DIAGNOSIS — Z961 Presence of intraocular lens: Secondary | ICD-10-CM

## 2021-09-11 DIAGNOSIS — H3581 Retinal edema: Secondary | ICD-10-CM | POA: Diagnosis not present

## 2021-09-11 DIAGNOSIS — H401130 Primary open-angle glaucoma, bilateral, stage unspecified: Secondary | ICD-10-CM

## 2021-09-11 DIAGNOSIS — H43811 Vitreous degeneration, right eye: Secondary | ICD-10-CM

## 2021-09-12 ENCOUNTER — Encounter (INDEPENDENT_AMBULATORY_CARE_PROVIDER_SITE_OTHER): Payer: Self-pay | Admitting: Ophthalmology

## 2021-09-19 ENCOUNTER — Non-Acute Institutional Stay (HOSPITAL_COMMUNITY)
Admission: RE | Admit: 2021-09-19 | Discharge: 2021-09-19 | Disposition: A | Discharge status: Home / Self Care | Payer: Medicare (Managed Care) | Source: Ambulatory Visit | Attending: Internal Medicine | Admitting: Internal Medicine

## 2021-09-19 ENCOUNTER — Other Ambulatory Visit: Payer: Self-pay

## 2021-09-19 ENCOUNTER — Encounter (HOSPITAL_COMMUNITY): Payer: Self-pay | Admitting: Gastroenterology

## 2021-09-19 DIAGNOSIS — N189 Chronic kidney disease, unspecified: Secondary | ICD-10-CM | POA: Insufficient documentation

## 2021-09-19 DIAGNOSIS — D631 Anemia in chronic kidney disease: Secondary | ICD-10-CM | POA: Insufficient documentation

## 2021-09-19 DIAGNOSIS — D509 Iron deficiency anemia, unspecified: Secondary | ICD-10-CM | POA: Diagnosis present

## 2021-09-19 LAB — COMPREHENSIVE METABOLIC PANEL
ALT: 13 IU/L (ref 0–32)
AST: 18 IU/L (ref 0–40)
Albumin/Globulin Ratio: 1.7 (ref 1.2–2.2)
Albumin: 4.4 g/dL (ref 3.7–4.7)
Alkaline Phosphatase: 90 IU/L (ref 44–121)
BUN/Creatinine Ratio: 20 (ref 12–28)
BUN: 16 mg/dL (ref 8–27)
Bilirubin Total: 0.4 mg/dL (ref 0.0–1.2)
CO2: 26 mmol/L (ref 20–29)
Calcium: 10.2 mg/dL (ref 8.7–10.3)
Chloride: 101 mmol/L (ref 96–106)
Creatinine, Ser: 0.8 mg/dL (ref 0.57–1.00)
Globulin, Total: 2.6 g/dL (ref 1.5–4.5)
Glucose: 96 mg/dL (ref 70–99)
Potassium: 3.6 mmol/L (ref 3.5–5.2)
Sodium: 141 mmol/L (ref 134–144)
Total Protein: 7 g/dL (ref 6.0–8.5)
eGFR: 76 mL/min/{1.73_m2} (ref >59)

## 2021-09-19 LAB — LIPID PANEL
Chol/HDL Ratio: 3.9 ratio (ref 0.0–4.4)
Cholesterol, Total: 208 mg/dL — ABNORMAL HIGH (ref 100–199)
HDL: 54 mg/dL (ref >39)
LDL Chol Calc (NIH): 128 mg/dL — ABNORMAL HIGH (ref 0–99)
Triglycerides: 148 mg/dL (ref 0–149)
VLDL Cholesterol Cal: 26 mg/dL (ref 5–40)

## 2021-09-19 MED ORDER — SODIUM CHLORIDE 0.9 % IV SOLN
INTRAVENOUS | Status: DC | PRN
  Administered 2021-09-19: 250 mL via INTRAVENOUS

## 2021-09-19 MED ORDER — SODIUM CHLORIDE 0.9 % IV SOLN
510.0000 mg | Freq: Once | INTRAVENOUS | Status: AC
  Administered 2021-09-19: 510 mg via INTRAVENOUS
  Filled 2021-09-19: qty 510

## 2021-09-19 NOTE — Progress Notes (Signed)
PATIENT CARE CENTER NOTE    Diagnosis: D50.9- Iron deficiency anemia unspecified, N18.9-Chronic kidney disease unspecified, D63.1- Anemia in chronic kidney disease   Provider: Barney Drain, MD   Procedure: Feraheme infusion    Note:  Patient received Feraheme infusion (1 of 2) via PIV. Tolerated well with no adverse reaction. Observed patient for 30 minutes post-infusion. Vital signs stable. Discharge instructions given. Patient to come back in 2 weeks for second infusion. Patient alert, oriented and ambulatory at discharge. Discharged home with daughter, [name].

## 2021-09-19 NOTE — Progress Notes (Signed)
Attempted to obtain medical history via telephone, unable to reach at this time. I left a voicemail on daughters listed number to return pre surgical testing department's phone call.

## 2021-09-25 ENCOUNTER — Other Ambulatory Visit: Payer: Self-pay

## 2021-09-25 ENCOUNTER — Ambulatory Visit (INDEPENDENT_AMBULATORY_CARE_PROVIDER_SITE_OTHER): Payer: Medicare (Managed Care)

## 2021-09-25 DIAGNOSIS — I422 Other hypertrophic cardiomyopathy: Secondary | ICD-10-CM | POA: Diagnosis not present

## 2021-09-25 DIAGNOSIS — E7800 Pure hypercholesterolemia, unspecified: Secondary | ICD-10-CM

## 2021-09-25 DIAGNOSIS — I10 Essential (primary) hypertension: Secondary | ICD-10-CM

## 2021-09-25 DIAGNOSIS — Z5181 Encounter for therapeutic drug level monitoring: Secondary | ICD-10-CM

## 2021-09-25 LAB — ECHOCARDIOGRAM COMPLETE
AR max vel: 1.3 cm2
AV Area VTI: 1.23 cm2
AV Area mean vel: 1.18 cm2
AV Mean grad: 3 mmHg
AV Peak grad: 5 mmHg
Ao pk vel: 1.11 m/s
Area-P 1/2: 5.04 cm2
MV M vel: 4.66 m/s
MV Peak grad: 86.7 mmHg
S' Lateral: 3.35 cm

## 2021-09-30 ENCOUNTER — Other Ambulatory Visit: Payer: Self-pay | Admitting: Gastroenterology

## 2021-09-30 ENCOUNTER — Encounter (HOSPITAL_COMMUNITY): Payer: Self-pay | Admitting: Gastroenterology

## 2021-09-30 ENCOUNTER — Ambulatory Visit (HOSPITAL_COMMUNITY): Payer: Medicare (Managed Care) | Admitting: Anesthesiology

## 2021-09-30 ENCOUNTER — Ambulatory Visit (HOSPITAL_COMMUNITY)
Admission: RE | Admit: 2021-09-30 | Discharge: 2021-09-30 | Disposition: A | Discharge status: Home / Self Care | Payer: Medicare (Managed Care) | Attending: Gastroenterology | Admitting: Gastroenterology

## 2021-09-30 ENCOUNTER — Encounter (HOSPITAL_COMMUNITY)
Admission: RE | Disposition: A | Discharge status: Home / Self Care | Payer: Self-pay | Source: Home / Self Care | Attending: Gastroenterology

## 2021-09-30 ENCOUNTER — Other Ambulatory Visit: Payer: Self-pay

## 2021-09-30 DIAGNOSIS — K760 Fatty (change of) liver, not elsewhere classified: Secondary | ICD-10-CM | POA: Diagnosis not present

## 2021-09-30 DIAGNOSIS — Z87891 Personal history of nicotine dependence: Secondary | ICD-10-CM | POA: Insufficient documentation

## 2021-09-30 DIAGNOSIS — I110 Hypertensive heart disease with heart failure: Secondary | ICD-10-CM | POA: Insufficient documentation

## 2021-09-30 DIAGNOSIS — K5520 Angiodysplasia of colon without hemorrhage: Secondary | ICD-10-CM | POA: Diagnosis not present

## 2021-09-30 DIAGNOSIS — N183 Chronic kidney disease, stage 3 unspecified: Secondary | ICD-10-CM

## 2021-09-30 DIAGNOSIS — Z950 Presence of cardiac pacemaker: Secondary | ICD-10-CM | POA: Insufficient documentation

## 2021-09-30 DIAGNOSIS — I5032 Chronic diastolic (congestive) heart failure: Secondary | ICD-10-CM | POA: Diagnosis not present

## 2021-09-30 DIAGNOSIS — K219 Gastro-esophageal reflux disease without esophagitis: Secondary | ICD-10-CM | POA: Insufficient documentation

## 2021-09-30 DIAGNOSIS — K31819 Angiodysplasia of stomach and duodenum without bleeding: Secondary | ICD-10-CM | POA: Insufficient documentation

## 2021-09-30 DIAGNOSIS — I4821 Permanent atrial fibrillation: Secondary | ICD-10-CM | POA: Diagnosis not present

## 2021-09-30 DIAGNOSIS — D509 Iron deficiency anemia, unspecified: Secondary | ICD-10-CM | POA: Diagnosis not present

## 2021-09-30 DIAGNOSIS — E785 Hyperlipidemia, unspecified: Secondary | ICD-10-CM | POA: Insufficient documentation

## 2021-09-30 DIAGNOSIS — G4730 Sleep apnea, unspecified: Secondary | ICD-10-CM | POA: Insufficient documentation

## 2021-09-30 DIAGNOSIS — I252 Old myocardial infarction: Secondary | ICD-10-CM | POA: Insufficient documentation

## 2021-09-30 HISTORY — PX: HOT HEMOSTASIS: SHX5433

## 2021-09-30 HISTORY — PX: ENTEROSCOPY: SHX5533

## 2021-09-30 HISTORY — PX: SUBMUCOSAL TATTOO INJECTION: SHX6856

## 2021-09-30 LAB — POCT I-STAT, CHEM 8
BUN: 65 mg/dL — ABNORMAL HIGH (ref 8–23)
Calcium, Ion: 1.27 mmol/L (ref 1.15–1.40)
Chloride: 105 mmol/L (ref 98–111)
Creatinine, Ser: 3 mg/dL — ABNORMAL HIGH (ref 0.44–1.00)
Glucose, Bld: 90 mg/dL (ref 70–99)
HCT: 26 % — ABNORMAL LOW (ref 36.0–46.0)
Hemoglobin: 8.8 g/dL — ABNORMAL LOW (ref 12.0–15.0)
Potassium: 3.8 mmol/L (ref 3.5–5.1)
Sodium: 140 mmol/L (ref 135–145)
TCO2: 26 mmol/L (ref 22–32)

## 2021-09-30 SURGERY — ENTEROSCOPY
Anesthesia: Monitor Anesthesia Care

## 2021-09-30 MED ORDER — SPOT INK MARKER SYRINGE KIT
PACK | SUBMUCOSAL | Status: AC
  Filled 2021-09-30: qty 5

## 2021-09-30 MED ORDER — PROPOFOL 500 MG/50ML IV EMUL
INTRAVENOUS | Status: DC | PRN
  Administered 2021-09-30: 80 ug/kg/min via INTRAVENOUS

## 2021-09-30 MED ORDER — SODIUM CHLORIDE 0.9 % IV SOLN: INTRAVENOUS | Status: DC

## 2021-09-30 MED ORDER — LIDOCAINE HCL (CARDIAC) PF 100 MG/5ML IV SOSY
PREFILLED_SYRINGE | INTRAVENOUS | Status: DC | PRN
  Administered 2021-09-30: 100 mg via INTRAVENOUS

## 2021-09-30 MED ORDER — LACTATED RINGERS IV SOLN: INTRAVENOUS | Status: DC

## 2021-09-30 MED ORDER — SPOT INK MARKER SYRINGE KIT
PACK | SUBMUCOSAL | Status: DC | PRN
  Administered 2021-09-30: 5 mL via SUBMUCOSAL
  Administered 2021-09-30: 2 mL via SUBMUCOSAL

## 2021-09-30 MED ORDER — PROPOFOL 10 MG/ML IV BOLUS
INTRAVENOUS | Status: DC | PRN
  Administered 2021-09-30: 20 mg via INTRAVENOUS

## 2021-09-30 NOTE — Anesthesia Postprocedure Evaluation (Signed)
Anesthesia Post Note  Patient: [name]  Procedure(s) Performed: ENTEROSCOPY HOT HEMOSTASIS (ARGON PLASMA COAGULATION/BICAP) SUBMUCOSAL TATTOO INJECTION     Patient location during evaluation: PACU Anesthesia Type: MAC Level of consciousness: awake Pain management: pain level controlled Vital Signs Assessment: post-procedure vital signs reviewed and stable Respiratory status: spontaneous breathing Cardiovascular status: stable Postop Assessment: no apparent nausea or vomiting Anesthetic complications: no   No notable events documented.  Last Vitals:  Vitals:   09/30/21 1110 09/30/21 1120  BP: 121/72 111/76  Pulse: 69 70  Resp: 18 16  Temp:  36.4 C  SpO2: 100% 100%    Last Pain:  Vitals:   09/30/21 1120  TempSrc:   PainSc: 0-No pain                 Renalda Locklin

## 2021-09-30 NOTE — Op Note (Signed)
[facility] Patient Name: [name] Procedure Date: 09/30/2021 MRN: [id number] Attending MD: Carlota Raspberry. [name] , MD Date of Birth: 12-Jan-1946 CSN: [id number] Age: 75 Admit Type: Outpatient Procedure:                Small bowel enteroscopy Indications:              Iron deficiency anemia, for therapy of suspected                            small bowel AVMs - patient with chronically dark                            stools, leading to multiple blood transfusions as                            outpatient. Last EGD 08/2020 with ablation of small                            bowel AVMs. Colonoscopy at that time without clear                            pathology Providers:                Remo Lipps P. [name], MD, Dulcy Fanny, Cherylynn Ridges, Technician, Luan Moore, Technician,                            Stephanie [place] CRNA Referring MD:              Medicines:                Monitored Anesthesia Care Complications:            No immediate complications. Estimated blood loss:                            Minimal. Estimated Blood Loss:     Estimated blood loss was minimal. Procedure:                Pre-Anesthesia Assessment:                           - Prior to the procedure, a History and Physical                            was performed, and patient medications and                            allergies were reviewed. The patient's tolerance of                            previous anesthesia was also reviewed. The risks                            and benefits of the  procedure and the sedation                            options and risks were discussed with the patient.                            All questions were answered, and informed consent                            was obtained. Prior Anticoagulants: The patient has                            taken Eliquis (apixaban), last dose was 2 days                            prior  to procedure. ASA Grade Assessment: III - A                            patient with severe systemic disease. After                            reviewing the risks and benefits, the patient was                            deemed in satisfactory condition to undergo the                            procedure.                           After obtaining informed consent, the endoscope was                            passed under direct vision. Throughout the                            procedure, the patient's blood pressure, pulse, and                            oxygen saturations were monitored continuously. The                            PCF-HQ190L ([id number]) Olympus colonoscope was                            introduced through the mouth and advanced to the                            proximal jejunum. The small bowel enteroscopy was                            accomplished without difficulty. The patient  tolerated the procedure well. Scope In: Scope Out: Findings:      The Z-line was regular.      The exam of the esophagus was otherwise normal.      A single small angiodysplastic lesion with no bleeding was found in the       gastric body. Fulguration to ablate the lesion to prevent bleeding by       argon plasma was successful (gastric setting).      A single diminutive angiodysplastic lesion with no bleeding was found in       the gastric antrum. Fulguration to ablate the lesion to prevent bleeding       by argon plasma was successful (gastric setting).      The exam of the stomach was otherwise normal.      Four angiodysplastic lesions with no bleeding were found in the second       portion of the duodenum. Adherent heme was noted next to one of these.       Fulguration to ablate the lesions to prevent bleeding by argon plasma       was successful (right colon setting).      The exam of the duodenum was otherwise normal.      A single angiodysplastic lesion with no  bleeding was found in the       proximal jejunum. Fulguration to ablate the lesion to prevent bleeding       by argon plasma was successful (right colon setting).      Exam of the jejunum was otherwise normal.      An area in the proximal jejunum was tattooed with an injection of Spot       (carbon black) at the distal most aspect of the bowel reached. After the       first tattoo placed, the scope was actually able to be advanced further       once a loop was reduced, and another tattoo was placed to mark the       distal extent reached.. Impression:               - Z-line regular.                           - Normal esophagus otherwise                           - A single non-bleeding angiodysplastic lesion in                            the stomach. Treated with argon plasma coagulation                            (APC).                           - A single non-bleeding angiodysplastic lesion in                            the stomach. Treated with argon plasma coagulation                            (APC).                           -  Normal stomach otherwise                           - Four non-bleeding angiodysplastic lesions in the                            duodenum. Treated with argon plasma coagulation                            (APC).                           - A single non-bleeding angiodysplastic lesion in                            the jejunum. Treated with argon plasma coagulation                            (APC).                           - An area in the distal most aspected of the                            jejunum was tattooed (see above, after initial                            tattoo placed, an additional tattoo was placed as                            the scope was able to be advanced further) Recommendation:           - Discharge patient to home.                           - Advance diet as tolerated.                           - Continue present medications.                            - Resume Eliquis tomorrow                           - Monitor Hgb as outpatient (patient follows with                            Hematology for this)                           - Hopefully treatment with APC will reduce need for                            transfusion moving forward. If anemia persists,                            consideration  for capsule endoscopy to assess                            distal small bowel. If high AVM burden, consider                            octreotide Procedure Code(s):        --- Professional ---                           [id number], Small intestinal endoscopy, enteroscopy                            beyond second portion of duodenum, not including                            ileum; with control of bleeding (eg, injection,                            bipolar cautery, unipolar cautery, laser, heater                            probe, stapler, plasma coagulator)                           44799, Unlisted procedure, small intestine Diagnosis Code(s):        --- Professional ---                           K31.819, Angiodysplasia of stomach and duodenum                            without bleeding                           D50.9, Iron deficiency anemia, unspecified                           K55.20, Angiodysplasia of colon without hemorrhage CPT copyright 2019 American Medical Association. All rights reserved. The codes documented in this report are preliminary and upon coder review may  be revised to meet current compliance requirements. Remo Lipps P. Dunbar Buras, MD 09/30/2021 11:08:04 AM This report has been signed electronically. Number of Addenda: 0

## 2021-09-30 NOTE — H&P (Signed)
[facility] History and Physical   Primary Care Physician:  [facility] [place]   Reason for Procedure:  Chronic anemia / Iron deficiency - small bowel AVMs  Plan:    Enteroscopy     HPI: [name] is a 75 y.o. female  here for enteroscopy today with possible ablation of AVMs with APC. Patient has a history of atrial fibrillation on Eliquis, chronic diastolic CHF, complete heart block s/p pacemaker, CVA, COPD, CKD stage 3b, laryngeal stenosis s/p tracheostomy, polymyalgia rheumatica, IDA, chronic GI blood loss secondary to AVMs. She has had ongoing persistent anemia followed by Dr. Alvy Bimler. She has required IV iron and blood transfusions over the past 6 months for worsening anemia. States she has been receiving blood transfusion every 2 weeks for the past few months. Stools chronically dark, she is on oral iron. Also on Eliquis. Last took it > 48 hours ago. She denies any new cardiopulmonary symptoms. Tracheostomy in place for 3 years. States it has been functioning okay without any new issues other than chronic mucous production. Otherwise denies abdominal pains or new complaints. She had an EGD and colonoscopy in 09/04/20 for this issue, had a few duodenal AVMs ablated and 8 small adenomas removed from her colon. No cause for anemia noted in the colon, thought to be related to small bowel AVMs. Prior capsule endoscopy remotely limited by poor prep.  Patient's daughter in pre-procedure area today, discussed the procedure with the patient and daughter, associated risks. They wish to proceed, further recommendations pending the results.    Past Medical History:  Diagnosis Date   Anemia    Angioedema    2/2 ACE   Arteriovenous malformation of stomach    Arthritis    Asthma    AVM (arteriovenous malformation) of colon    small intestine; stomach   Blood transfusion    CHF (congestive heart failure) (HCC)    Complete heart block (HCC)     s/p PPM 1194   Complication of anesthesia    Difficult airway; anaphylaxis and swelling with propofol   DDD (degenerative disc disease)    Depression    Diastolic heart failure    Fatty liver 07/26/2010   GERD (gastroesophageal reflux disease)    GI bleed    History of alcohol abuse Stopped Fall 2012   History of tobacco use Quit Fall 2012   Hx of cardiovascular stress test    a. Lexiscan Myoview (10/15):  Small inferolateral and apical defect c/w scar and poss soft tissue attenuation, no ischemia, EF 42%   Hx of colonic polyp 08/13/2010   adenomatous   Hx of colonoscopy    Hyperlipidemia    Hypertension    Hypertrophic cardiomyopathy ([place])    dx by Dr [name] 2009   Iron deficiency anemia    Myocardial infarction [facility])    Pacemaker    Panic attacks    Permanent atrial fibrillation ([place])    Renal failure    baseline creatinine 1.6   Right arm pain 01/08/2012   RLS (restless legs syndrome)    Dx 06/2007   Sleep apnea    no cpap     Past Surgical History:  Procedure Laterality Date   BIOPSY  09/04/2020   Procedure: BIOPSY;  Surgeon: Mauri Pole, MD;  Location: [facility];  Service: Endoscopy;;   CARDIAC CATHETERIZATION     CARDIAC CATHETERIZATION N/A 08/19/2016   Procedure: Right/Left Heart Cath and Coronary Angiography;  Surgeon: Shaune Pascal Bensimhon,  MD;  Location: [place] [facility] LAB;  Service: Cardiovascular;  Laterality: N/A;   COLONOSCOPY WITH PROPOFOL N/A 06/16/2017   Procedure: COLONOSCOPY WITH PROPOFOL;  Surgeon: Mauri Pole, MD;  Location: [facility];  Service: Endoscopy;  Laterality: N/A;   COLONOSCOPY WITH PROPOFOL N/A 09/04/2020   Procedure: COLONOSCOPY WITH PROPOFOL;  Surgeon: Mauri Pole, MD;  Location: [facility];  Service: Endoscopy;  Laterality: N/A;   DIRECT LARYNGOSCOPY N/A 09/18/2016   Procedure: DIRECT LARYNGOSCOPY, BRONCHOSCOPY, REMOVAL OF INTUBATION GRANULOMA;  Surgeon: [name], MD;  Location: [facility] OR;  Service: ENT;   Laterality: N/A;   DIRECT LARYNGOSCOPY N/A 10/20/2016   Procedure: EXTUBATION AND FLEXIBLE LARYNGOSCOPE;  Surgeon: [name], MD;  Location[facility];  Service: ENT;  Laterality: N/A;   DIRECT LARYNGOSCOPY N/A 10/29/2016   Procedure: DIRECT LARYNGOSCOPY;  Surgeon: [name], MD;  Location: [facility] OR;  Service: ENT;  Laterality: N/A;   ESOPHAGOGASTRODUODENOSCOPY  12/23/2011   Procedure: ESOPHAGOGASTRODUODENOSCOPY (EGD);  Surgeon: [name], MD;  Location: [facility];  Service: Endoscopy;  Laterality: N/A;   ESOPHAGOGASTRODUODENOSCOPY (EGD) WITH PROPOFOL N/A 06/16/2017   Procedure: ESOPHAGOGASTRODUODENOSCOPY (EGD) WITH PROPOFOL;  Surgeon: Mauri Pole, MD;  Location: [facility];  Service: Endoscopy;  Laterality: N/A;   ESOPHAGOGASTRODUODENOSCOPY (EGD) WITH PROPOFOL N/A 09/04/2020   Procedure: ESOPHAGOGASTRODUODENOSCOPY (EGD) WITH PROPOFOL;  Surgeon: Mauri Pole, MD;  Location: [facility];  Service: Endoscopy;  Laterality: N/A;   EXTUBATION (ENDOTRACHEAL) IN OR N/A 07/21/2016   Procedure: EXTUBATION (ENDOTRACHEAL) IN OR;  Surgeon: [name], MD;  Location: [place];  Service: ENT;  Laterality: N/A;   [place] STUDY  12/23/2011   Procedure: GIVENS CAPSULE STUDY;  Surgeon: [name], MD;  Location: [facility];  Service: Endoscopy;  Laterality: N/A;   HOT HEMOSTASIS N/A 09/04/2020   Procedure: HOT HEMOSTASIS (ARGON PLASMA COAGULATION/BICAP);  Surgeon: Mauri Pole, MD;  Location: [facility];  Service: Endoscopy;  Laterality: N/A;   MICROLARYNGOSCOPY WITH LASER N/A 10/16/2016   Procedure: MICRODIRECTLARYNGOSCOPY WITH LASER ABLATION AND KENLOG INJECTION;  Surgeon: [name], MD;  Location: [place];  Service: ENT;  Laterality: N/A;   [place], most recent gen change by [facility] 4/12   POLYPECTOMY  09/04/2020   Procedure: POLYPECTOMY;  Surgeon: Mauri Pole, MD;  Location: [facility];  Service: Endoscopy;;   TRACHEOSTOMY TUBE PLACEMENT  N/A 10/29/2016   Procedure: TRACHEOSTOMY;  Surgeon: [name], MD;  Location: [place];  Service: ENT;  Laterality: N/A;   TUBAL LIGATION  04/01/2000    Prior to Admission medications   Medication Sig Start Date End Date Taking? Authorizing Provider  acetaminophen (TYLENOL) 650 MG CR tablet Take 650 mg by mouth every 8 (eight) hours as needed for pain.    Yes [provider]  albuterol (PROVENTIL HFA;VENTOLIN HFA) 108 (90 Base) MCG/ACT inhaler Inhale 1-2 puffs into the lungs every 6 (six) hours as needed for wheezing or shortness of breath. 08/04/18  Yes Riccio, Angela C, DO  allopurinol (ZYLOPRIM) 100 MG tablet TAKE 2 TABLETS(200 MG) BY MOUTH DAILY Patient taking differently: Take 100 mg by mouth 2 (two) times daily. 05/18/19  Yes Riccio, Gardiner Rhyme, DO  ALPRAZolam (XANAX) 0.5 MG tablet Take 1 tablet (0.5 mg total) by mouth at bedtime as needed for anxiety. 08/15/19  Yes Enid Derry, [name], DO  apixaban (ELIQUIS) 2.5 MG TABS tablet Take 1 tablet (2.5 mg total) by mouth 2 (two) times daily. 09/03/21  Yes Fabian Sharp  Elmyra Ricks, PA  brimonidine (ALPHAGAN) 0.2 % ophthalmic solution Place 1 drop into the right eye in the morning and at bedtime.   Yes [provider]  cetirizine (ZYRTEC) 10 MG tablet Take 10 mg by mouth daily as needed for allergies.    Yes [provider]  donepezil (ARICEPT ODT) 10 MG disintegrating tablet Take 10 mg by mouth at bedtime.   Yes [provider]  Epoetin Alfa-epbx (RETACRIT IJ) Inject 1 Dose as directed once a week.    Yes [provider]  ferrous sulfate 325 (65 FE) MG tablet TAKE 1 TABLET BY MOUTH TWICE DAILY WITH MEALS TO KEEP BLOOD COUNT UP 08/26/16  Yes Riccio, Angela C, DO  furosemide (LASIX) 20 MG tablet Take 1 tablet (20 mg total) by mouth daily. Daily as needed for weight gain of 2lbs in a day or 5lbs in a week 12/14/19  Yes Skeet Latch, MD  ipratropium-albuterol (DUONEB) 0.5-2.5 (3) MG/3ML SOLN USE 3 ML VIA NEBULIZER  EVERY 6 HOURS Patient taking differently: Inhale 3 mLs into the lungs every 6 (six) hours as needed (wheezing/shortness of breath). 10/21/18  Yes Riccio, Angela C, DO  latanoprost (XALATAN) 0.005 % ophthalmic solution Place 1 drop into both eyes at bedtime. 11/24/19  Yes [provider]  metoprolol tartrate (LOPRESSOR) 25 MG tablet Take 1 tablet (25 mg total) by mouth 2 (two) times daily. 09/30/19  Yes Skeet Latch, MD  mirtazapine (REMERON) 7.5 MG tablet Take 7.5 mg by mouth at bedtime.   Yes [provider]  nitroGLYCERIN (NITROSTAT) 0.4 MG SL tablet Place 1 tablet (0.4 mg total) under the tongue every 5 (five) minutes as needed for chest pain. 08/04/18  Yes Riccio, Angela C, DO  Olopatadine HCl 0.2 % SOLN Place 1-2 drops into both eyes daily as needed (itching).  02/17/18  Yes [provider]  oxymetazoline (AFRIN NASAL SPRAY) 0.05 % nasal spray Place 1 spray into both nostrils 2 (two) times daily. Patient taking differently: Place 1 spray into both nostrils 2 (two) times daily as needed for congestion. 05/21/19  Yes Hall-Potvin, [place], PA-C  potassium chloride (K-DUR) 10 MEQ tablet Take 1 tablet (10 mEq total) by mouth daily. 08/19/19  Yes Enid Derry, [name], DO  rosuvastatin (CRESTOR) 20 MG tablet Take 1 tablet (20 mg total) by mouth at bedtime. 08/19/19  Yes Enid Derry, [name], DO  sodium chloride (OCEAN) 0.65 % SOLN nasal spray Place 1 spray into both nostrils daily as needed for congestion.    Yes [provider]  STIMULANT LAXATIVE 8.6-50 MG tablet Take 2 tablets by mouth at bedtime as needed for mild constipation.  11/22/19  Yes [provider]    Current Facility-Administered Medications  Medication Dose Route Frequency Provider Last Rate Last Admin   0.9 %  sodium chloride infusion   Intravenous Continuous Margarie Mcguirt, Carlota Raspberry, MD       lactated ringers infusion   Intravenous Continuous Arienna Benegas, Carlota Raspberry, MD        Allergies as of 08/12/2021 -  Review Complete 07/23/2021  Allergen Reaction Noted   Ace inhibitors Swelling 03/15/2008   Other Anaphylaxis and Swelling 10/15/2016   Propofol  07/15/2016    Family History  Problem Relation Age of Onset   Hypertension Mother    Stroke Mother    Heart disease Mother    Aneurysm Mother    CVA Mother    Hypertension Father    Stroke Father    Heart attack Father    Alcohol  abuse Father    Hypertension Sister    Hypertension Brother    Colon cancer Brother    Cancer Brother    Diabetes Sister    Diabetes Brother    Heart attack Brother    Heart attack Brother    Heart disease Sister    Alcohol abuse Brother    Heart disease Daughter    Esophageal cancer Neg Hx    Stomach cancer Neg Hx    Rectal cancer Neg Hx     Social History   Socioeconomic History   Marital status: Divorced    Spouse name: Not on file   Number of children: 4   Years of education: 9   Highest education level: Not on file  Occupational History   Occupation: disabled    Fish farm manager: UNEMPLOYED   Occupation: previously- Development worker, community  Tobacco Use   Smoking status: Former    Packs/day: 0.10    Years: 50.00    Pack years: 5.00    Types: Cigarettes    Quit date: 08/16/2011    Years since quitting: 10.1   Smokeless tobacco: Never   Tobacco comments:    quit 2012  Vaping Use   Vaping Use: Never used  Substance and Sexual Activity   Alcohol use: No    Comment: quit 08/16/2011   Drug use: No   Sexual activity: Not Currently  Other Topics Concern   Not on file  Social History Narrative   On disability for heart failure/pacemaker.     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    4 grown children,  8 grandchildren.     Drinks alcohol a few times a week socially. At one time, the most she reports drinking is about 3 mixed drinks.   Lives with daughter [name] and son. Will be moving 04/2011 to different house because concerned children are taking advantage of her financial situation.     No illicit drugs.   Smokes few cigarettes daily, especially when stressed. Started smoking @ age 71. Quit January 2012 2/2 health but started smoking again 02/2011.       Health Care POA:    Emergency Contact: daughter, Verdene Lennert [phone]   End of Life Plan: gave pt AD info pamphlet   Who lives with you: no one- lives in section 8 housing   Any pets: none   Diet: Pt has a varied diet of protein, starch and vegetables   Exercise: Pt has no regular exercise routine.   Seatbelts: Pt reports wearing seatbelt when in vehicles.    Hobbies: dancing   Social Determinants of Health   Financial Resource Strain: Not on file  Food Insecurity: Not on file  Transportation Needs: Not on file  Physical Activity: Not on file  Stress: Not on file  Social Connections: Not on file  Intimate Partner Violence: Not on file    Review of Systems: All other review of systems negative except as mentioned in the HPI.  Physical Exam: Vital signs BP 122/72   Pulse 70   Temp 98.4 F (36.9 C) (Oral)   Resp 17   Ht 5\' 2"  (1.575 m)   Wt 58.1 kg   SpO2 100%   BMI 23.41 kg/m   General:   Alert, pleasant and cooperative in NAD. Tracheostomy in place Lungs:  Clear throughout to auscultation.   Heart:  Regular rate and rhythm Abdomen:  Soft, nontender and nondistended.   Neuro/Psych:  Alert and cooperative. Normal mood and affect. A  and O x 3  Jolly Mango, MD [facility]

## 2021-09-30 NOTE — Transfer of Care (Signed)
Immediate Anesthesia Transfer of Care Note  Patient: Tonita Roca-Polk  Procedure(s) Performed: ENTEROSCOPY HOT HEMOSTASIS (ARGON PLASMA COAGULATION/BICAP) SUBMUCOSAL TATTOO INJECTION  Patient Location: PACU  Anesthesia Type:MAC  Level of Consciousness: awake  Airway & Oxygen Therapy: Patient Spontanous Breathing and Patient connected to face mask oxygen  Post-op Assessment: Report given to RN and Post -op Vital signs reviewed and stable  Post vital signs: Reviewed and stable  Last Vitals:  Vitals Value Taken Time  BP 115/60 09/30/21 1058  Temp 36.4 C 09/30/21 1058  Pulse 70 09/30/21 1105  Resp 15 09/30/21 1105  SpO2 100 % 09/30/21 1105  Vitals shown include unvalidated device data.  Last Pain:  Vitals:   09/30/21 1058  TempSrc:   PainSc: 0-No pain         Complications: No notable events documented.

## 2021-09-30 NOTE — Anesthesia Preprocedure Evaluation (Signed)
Anesthesia Evaluation  Patient identified by MRN, date of birth, ID band Patient awake    Reviewed: NPO status   Airway Mallampati: II       Dental   Pulmonary asthma , sleep apnea , COPD, former smoker,    breath sounds clear to auscultation       Cardiovascular hypertension, + Past MI and +CHF  + dysrhythmias + pacemaker  Rhythm:Regular Rate:Normal     Neuro/Psych Anxiety Depression CVA    GI/Hepatic Neg liver ROS, GERD  ,  Endo/Other    Renal/GU Renal disease     Musculoskeletal  (+) Arthritis ,   Abdominal   Peds  Hematology   Anesthesia Other Findings   Reproductive/Obstetrics                             Anesthesia Physical Anesthesia Plan  ASA: 3  Anesthesia Plan: MAC   Post-op Pain Management:    Induction: Intravenous  PONV Risk Score and Plan: 2 and Propofol infusion and Ondansetron  Airway Management Planned: Tracheostomy and Nasal Cannula  Additional Equipment:   Intra-op Plan:   Post-operative Plan:   Informed Consent: I have reviewed the patients History and Physical, chart, labs and discussed the procedure including the risks, benefits and alternatives for the proposed anesthesia with the patient or authorized representative who has indicated his/her understanding and acceptance.     Dental advisory given  Plan Discussed with: CRNA, Anesthesiologist and Surgeon  Anesthesia Plan Comments:         Anesthesia Quick Evaluation

## 2021-10-01 ENCOUNTER — Encounter (HOSPITAL_COMMUNITY): Payer: Self-pay | Admitting: Gastroenterology

## 2021-10-01 ENCOUNTER — Other Ambulatory Visit (INDEPENDENT_AMBULATORY_CARE_PROVIDER_SITE_OTHER): Payer: Medicare (Managed Care)

## 2021-10-01 ENCOUNTER — Telehealth (HOSPITAL_BASED_OUTPATIENT_CLINIC_OR_DEPARTMENT_OTHER): Payer: Self-pay | Admitting: *Deleted

## 2021-10-01 DIAGNOSIS — E7800 Pure hypercholesterolemia, unspecified: Secondary | ICD-10-CM

## 2021-10-01 DIAGNOSIS — N183 Chronic kidney disease, stage 3 unspecified: Secondary | ICD-10-CM | POA: Diagnosis not present

## 2021-10-01 DIAGNOSIS — Z5181 Encounter for therapeutic drug level monitoring: Secondary | ICD-10-CM

## 2021-10-01 DIAGNOSIS — I422 Other hypertrophic cardiomyopathy: Secondary | ICD-10-CM

## 2021-10-01 DIAGNOSIS — I10 Essential (primary) hypertension: Secondary | ICD-10-CM

## 2021-10-01 LAB — BASIC METABOLIC PANEL
BUN: 55 mg/dL — ABNORMAL HIGH (ref 6–23)
CO2: 28 mEq/L (ref 19–32)
Calcium: 9.8 mg/dL (ref 8.4–10.5)
Chloride: 105 mEq/L (ref 96–112)
Creatinine, Ser: 2.74 mg/dL — ABNORMAL HIGH (ref 0.40–1.20)
GFR: 16.19 mL/min — ABNORMAL LOW (ref >60.00)
Glucose, Bld: 90 mg/dL (ref 70–99)
Potassium: 3.6 mEq/L (ref 3.5–5.1)
Sodium: 143 mEq/L (ref 135–145)

## 2021-10-01 MED ORDER — ROSUVASTATIN CALCIUM 40 MG PO TABS: 40.0000 mg | ORAL_TABLET | Freq: Every day | ORAL | 3 refills | Status: DC

## 2021-10-01 NOTE — Telephone Encounter (Signed)
Advised daughter [name], ok per [facility]

## 2021-10-01 NOTE — Telephone Encounter (Signed)
-----   Message from Skeet Latch, MD sent at 09/30/2021  5:34 AM EDT ----- Echo shows that her heart muscle still very thick but now her heart seems to be a little weaker than in the past.  Recommend getting a cardiac MRI and get follow-up scheduled after to discuss.

## 2021-10-01 NOTE — Telephone Encounter (Signed)
-----   Message from Skeet Latch, MD sent at 09/30/2021  5:32 AM EDT ----- Cholesterol levels are too high.  Patient still continue rosuvastatin?  If so she needs to increase to 40 mg.  If not she needs to start back taking it and repeat labs either way in 2 to 3 months.

## 2021-10-03 ENCOUNTER — Non-Acute Institutional Stay (HOSPITAL_COMMUNITY)
Admission: RE | Admit: 2021-10-03 | Discharge: 2021-10-03 | Disposition: A | Discharge status: Home / Self Care | Payer: Medicare (Managed Care) | Source: Ambulatory Visit | Attending: Internal Medicine | Admitting: Internal Medicine

## 2021-10-03 ENCOUNTER — Other Ambulatory Visit: Payer: Self-pay

## 2021-10-03 DIAGNOSIS — D631 Anemia in chronic kidney disease: Secondary | ICD-10-CM | POA: Insufficient documentation

## 2021-10-03 DIAGNOSIS — N189 Chronic kidney disease, unspecified: Secondary | ICD-10-CM | POA: Diagnosis not present

## 2021-10-03 DIAGNOSIS — D509 Iron deficiency anemia, unspecified: Secondary | ICD-10-CM | POA: Insufficient documentation

## 2021-10-03 MED ORDER — SODIUM CHLORIDE 0.9 % IV SOLN
510.0000 mg | Freq: Once | INTRAVENOUS | Status: AC
  Administered 2021-10-03: 510 mg via INTRAVENOUS
  Filled 2021-10-03: qty 17

## 2021-10-03 MED ORDER — SODIUM CHLORIDE 0.9 % IV SOLN: INTRAVENOUS | Status: DC | PRN

## 2021-10-03 NOTE — Progress Notes (Signed)
PATIENT CARE CENTER NOTE       Diagnosis: D50.9- Iron deficiency anemia unspecified, N18.9-Chronic kidney disease unspecified, D63.1- Anemia in chronic kidney disease     Provider: Barney Drain, MD     Procedure: Feraheme infusion      Note:  Patient received Feraheme infusion (dose 2 of 2) via PIV. Tolerated well with no adverse reaction. Observed patient for 30 minutes post-infusion. Vital signs stable. Discharge instructions given. Patient alert, oriented and ambulatory at discharge. Discharged home with daughter, [name].

## 2021-10-28 ENCOUNTER — Encounter (HOSPITAL_BASED_OUTPATIENT_CLINIC_OR_DEPARTMENT_OTHER): Payer: Self-pay | Admitting: Cardiovascular Disease

## 2021-10-28 ENCOUNTER — Other Ambulatory Visit: Payer: Self-pay

## 2021-10-28 ENCOUNTER — Ambulatory Visit (INDEPENDENT_AMBULATORY_CARE_PROVIDER_SITE_OTHER): Payer: Medicare (Managed Care) | Admitting: Cardiovascular Disease

## 2021-10-28 VITALS — BP 138/92 | HR 69 | Ht 62.0 in | Wt 141.0 lb

## 2021-10-28 DIAGNOSIS — I422 Other hypertrophic cardiomyopathy: Secondary | ICD-10-CM

## 2021-10-28 DIAGNOSIS — E7800 Pure hypercholesterolemia, unspecified: Secondary | ICD-10-CM

## 2021-10-28 DIAGNOSIS — I10 Essential (primary) hypertension: Secondary | ICD-10-CM

## 2021-10-28 DIAGNOSIS — I421 Obstructive hypertrophic cardiomyopathy: Secondary | ICD-10-CM

## 2021-10-28 DIAGNOSIS — Z5181 Encounter for therapeutic drug level monitoring: Secondary | ICD-10-CM

## 2021-10-28 DIAGNOSIS — R0602 Shortness of breath: Secondary | ICD-10-CM | POA: Diagnosis not present

## 2021-10-28 DIAGNOSIS — N183 Chronic kidney disease, stage 3 unspecified: Secondary | ICD-10-CM

## 2021-10-28 NOTE — Progress Notes (Signed)
Cardiology Office Note  Date:  10/28/2021   ID:  [name], DOB 11/12/1946, MRN [id number]  PCP:  [facility], [place]  Cardiologist:   Skeet Latch, MD  Electrophysiologist: Thompson Grayer, MD   No chief complaint on file.   Patient ID: [name] is a 75 y.o. female with hypertrophic cardiomyopathy, hypertension, hyperlipidemia, CKD IV, chronic atrial fibrillation, (not anticoagulated due to GI bleed), chronic diastolic heart failure, complete heart block status post pacemaker ([employer]. Jude), and COPD who presents for follow up.    History of Present Illness: [name] was first seen on 12/14/15 as a referral from Dr. Rayann Heman. She was referred to general cardiology to establish care. She had been admitted to the hospital earlier that month with a complaint of chest pain and was noted to be mildly volume overloaded. Her BNP was 436 and echo revealed normal systolic function with grade 2 diastolic dysfunction. She was diuresed with IV Lasix and developed acute renal failure. Troponin was mildly elevated to 0.19.  Lexiscan Cardiolite was negative for ischemia.  [name] continued to struggle with palpitations and dizziness.  She has been in chronic atrial fibrillation and has not been on anticoagulation due to a GI bleed in 2012. She has known AVMs. She was referred for consideration of a Watchman device 01/5851, which was complicated by acute respiratory distress during the TEE that required intubation.  She developed acute biventricular failure that eventually normalized.  She was discharged to a SNF and was then readmitted with volume overload.  She has struggled with laryngeal stenosis managed by her ENT in [facility].  She is not a candidate for removal of her trach due to residual stenosis.  She has also been admitted with stroke 04/2019 with residual expressive aphasia.  She was subsequently started on Eliquis despite her GI  bleed history.  The following month she noted blood in her stool but h/h was stable.  She was noted to have acute on chronic renal failure and was referred to nephrology.  She last saw [name] 05/2019 and was doing well from a cardiac standpoint, but was frustrated by expressive aphasia.   Metoprolol was added due to HCM, palpitations, and dyspnea.  Her palpitations improved.  She saw ENT at [facility] and was thinking about haivng the trach removed. She followed up with [name], PA on 08/2021. There were continued questions about stopping Eliquis due to chronic GI bleeding requiring transfusions. Eliquis was reduced to 2.5 mg to try to lower her bleeding risk.  At her last appointment she reported tingling in bilateral hands and occasional chest pain.  She was referred for an echo which revealed LVEF 40 to 45% with severe concentric LVH and speckled myocardium concerning for infiltrative cardiomyopathy.  The right ventricle was speckled as well.  She was referred for cardiac MRI but could not get it because of her ICD.  She notes a strong family history of many family members with heart failure.  1 brother did die of sudden cardiac death.  She had small bowel enteroscopy with Dr. [name] on 09/30/2021.  She was found to have a single small angiodysplastic lesion in the gastric body.  It was not bleeding at the time.  He was treated with APC.  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  There were 4 lesions treated in the duodenum, 1 in the jejunum she was instructed to resume her Eliquis  the following day.  She reports being told to discontinue her lasix at that time.  She denies any lower extremity edema.  Her appetite has been much better in the last month and she has been eating a lot.  She thinks this is why she has gained weight.  She continues to have exertional dyspnea and some secretions from her trach at night.  She has no chest pain or  pressure.   Past Medical History:  Diagnosis Date   Anemia    Angioedema    2/2 ACE   Arteriovenous malformation of stomach    Arthritis    Asthma    AVM (arteriovenous malformation) of colon    small intestine; stomach   Blood transfusion    CHF (congestive heart failure) (HCC)    Complete heart block (HCC)    s/p PPM 4782   Complication of anesthesia    Difficult airway; anaphylaxis and swelling with propofol   DDD (degenerative disc disease)    Depression    Diastolic heart failure    Fatty liver 07/26/2010   GERD (gastroesophageal reflux disease)    GI bleed    History of alcohol abuse Stopped Fall 2012   History of tobacco use Quit Fall 2012   Hx of cardiovascular stress test    a. Lexiscan Myoview (10/15):  Small inferolateral and apical defect c/w scar and poss soft tissue attenuation, no ischemia, EF 42%   Hx of colonic polyp 08/13/2010   adenomatous   Hx of colonoscopy    Hyperlipidemia    Hypertension    Hypertrophic cardiomyopathy ([place])    dx by Dr [name] 2009   Iron deficiency anemia    Myocardial infarction [facility])    Pacemaker    Panic attacks    Permanent atrial fibrillation ([place])    Renal failure    baseline creatinine 1.6   Right arm pain 01/08/2012   RLS (restless legs syndrome)    Dx 06/2007   Sleep apnea    no cpap     Past Surgical History:  Procedure Laterality Date   BIOPSY  09/04/2020   Procedure: BIOPSY;  Surgeon: Mauri Pole, MD;  Location: [facility];  Service: Endoscopy;;   CARDIAC CATHETERIZATION     CARDIAC CATHETERIZATION N/A 08/19/2016   Procedure: Right/Left Heart Cath and Coronary Angiography;  Surgeon: Jolaine Artist, MD;  Location: [place] [facility];  Service: Cardiovascular;  Laterality: N/A;   COLONOSCOPY WITH PROPOFOL N/A 06/16/2017   Procedure: COLONOSCOPY WITH PROPOFOL;  Surgeon: Mauri Pole, MD;  [facility]: [facility];  Service: Endoscopy;  Laterality: N/A;   COLONOSCOPY WITH PROPOFOL N/A 09/04/2020    Procedure: COLONOSCOPY WITH PROPOFOL;  Surgeon: Mauri Pole, MD;  [facility];  Service: Endoscopy;  Laterality: N/A;   DIRECT LARYNGOSCOPY N/A 09/18/2016   Procedure: DIRECT LARYNGOSCOPY, BRONCHOSCOPY, REMOVAL OF INTUBATION GRANULOMA;  Surgeon: [name], MD;  Location: [facility] OR;  Service: ENT;  Laterality: N/A;   DIRECT LARYNGOSCOPY N/A 10/20/2016   Procedure: EXTUBATION AND FLEXIBLE LARYNGOSCOPE;  Surgeon: [name], MD;  Location: [place];  Service: ENT;  Laterality: N/A;   DIRECT LARYNGOSCOPY N/A 10/29/2016   Procedure: DIRECT LARYNGOSCOPY;  Surgeon: [name], MD;  Location: [place];  Service: ENT;  Laterality: N/A;   ENTEROSCOPY N/A 09/30/2021   Procedure: ENTEROSCOPY;  Surgeon: Yetta Flock, MD;  [facility];  Service: Gastroenterology;  Laterality: N/A;   ESOPHAGOGASTRODUODENOSCOPY  12/23/2011   Procedure: ESOPHAGOGASTRODUODENOSCOPY (EGD);  Surgeon: Sydell Axon  Andris Baumann, MD;  [facility];  Service: Endoscopy;  Laterality: N/A;   ESOPHAGOGASTRODUODENOSCOPY (EGD) WITH PROPOFOL N/A 06/16/2017   Procedure: ESOPHAGOGASTRODUODENOSCOPY (EGD) WITH PROPOFOL;  Surgeon: Mauri Pole, MD;  [facility];  Service: Endoscopy;  Laterality: N/A;   ESOPHAGOGASTRODUODENOSCOPY (EGD) WITH PROPOFOL N/A 09/04/2020   Procedure: ESOPHAGOGASTRODUODENOSCOPY (EGD) WITH PROPOFOL;  Surgeon: Mauri Pole, MD;  [facility];  Service: Endoscopy;  Laterality: N/A;   EXTUBATION (ENDOTRACHEAL) IN OR N/A 07/21/2016   Procedure: EXTUBATION (ENDOTRACHEAL) IN OR;  Surgeon: [name], MD;  Location: [place];  Service: ENT;  Laterality: N/A;   [place] STUDY  12/23/2011   Procedure: GIVENS CAPSULE STUDY;  Surgeon: [name], MD;  [facility];  Service: Endoscopy;  Laterality: N/A;   HOT HEMOSTASIS N/A 09/04/2020   Procedure: HOT HEMOSTASIS (ARGON PLASMA COAGULATION/BICAP);  Surgeon: Mauri Pole, MD;  [facility];   Service: Endoscopy;  Laterality: N/A;   HOT HEMOSTASIS N/A 09/30/2021   Procedure: HOT HEMOSTASIS (ARGON PLASMA COAGULATION/BICAP);  Surgeon: Yetta Flock, MD;  Location[facility];  Service: Gastroenterology;  Laterality: N/A;   MICROLARYNGOSCOPY WITH LASER N/A 10/16/2016   Procedure: MICRODIRECTLARYNGOSCOPY WITH LASER ABLATION AND KENLOG INJECTION;  Surgeon: [name], MD;  Location: [place];  Service: ENT;  Laterality: N/A;   [place], most recent gen change by [facility] 4/12   POLYPECTOMY  09/04/2020   Procedure: POLYPECTOMY;  Surgeon: Mauri Pole, MD;  [facility];  Service: Endoscopy;;   SUBMUCOSAL TATTOO INJECTION  09/30/2021   Procedure: SUBMUCOSAL TATTOO INJECTION;  Surgeon: Yetta Flock, MD;  Location[facility];  Service: Gastroenterology;;   TRACHEOSTOMY TUBE PLACEMENT N/A 10/29/2016   Procedure: TRACHEOSTOMY;  Surgeon: [name], MD;  Location: [place];  Service: ENT;  Laterality: N/A;   TUBAL LIGATION  04/01/2000     Current Outpatient Medications  Medication Sig Dispense Refill   acetaminophen (TYLENOL) 650 MG CR tablet Take 650 mg by mouth every 8 (eight) hours as needed for pain.      albuterol (PROVENTIL HFA;VENTOLIN HFA) 108 (90 Base) MCG/ACT inhaler Inhale 1-2 puffs into the lungs every 6 (six) hours as needed for wheezing or shortness of breath. 1 Inhaler 6   allopurinol (ZYLOPRIM) 100 MG tablet TAKE 2 TABLETS(200 MG) BY MOUTH DAILY (Patient taking differently: Take 100 mg by mouth 2 (two) times daily.) 180 tablet 3   ALPRAZolam (XANAX) 0.5 MG tablet Take 1 tablet (0.5 mg total) by mouth at bedtime as needed for anxiety. 30 tablet 0   apixaban (ELIQUIS) 2.5 MG TABS tablet Take 1 tablet (2.5 mg total) by mouth 2 (two) times daily. 60 tablet 1   bimatoprost (LUMIGAN) 0.01 % SOLN Place 1 drop into both eyes at bedtime.     brimonidine (ALPHAGAN) 0.2 % ophthalmic solution Place 1 drop into the right eye in the  morning and at bedtime.     cetirizine (ZYRTEC) 10 MG tablet Take 10 mg by mouth daily as needed for allergies.      donepezil (ARICEPT ODT) 10 MG disintegrating tablet Take 10 mg by mouth at bedtime.     Epoetin Alfa-epbx (RETACRIT IJ) Inject 1 Dose as directed once a week.      ferrous sulfate 325 (65 FE) MG tablet TAKE 1 TABLET BY MOUTH TWICE DAILY WITH MEALS TO KEEP BLOOD COUNT UP 60 tablet 11   ipratropium-albuterol (DUONEB) 0.5-2.5 (3) MG/3ML SOLN USE 3  ML VIA NEBULIZER EVERY 6 HOURS (Patient taking differently: Inhale 3 mLs into the lungs every 6 (six) hours as needed (wheezing/shortness of breath).) 360 mL 0   metoprolol tartrate (LOPRESSOR) 25 MG tablet Take 1 tablet (25 mg total) by mouth 2 (two) times daily. 60 tablet 5   mirtazapine (REMERON) 7.5 MG tablet Take 7.5 mg by mouth at bedtime.     nitroGLYCERIN (NITROSTAT) 0.4 MG SL tablet Place 1 tablet (0.4 mg total) under the tongue every 5 (five) minutes as needed for chest pain. 25 tablet 1   Olopatadine HCl 0.2 % SOLN Place 1-2 drops into both eyes daily as needed (itching).   1   oxymetazoline (AFRIN NASAL SPRAY) 0.05 % nasal spray Place 1 spray into both nostrils 2 (two) times daily. (Patient taking differently: Place 1 spray into both nostrils 2 (two) times daily as needed for congestion.) 30 mL 0   rosuvastatin (CRESTOR) 40 MG tablet Take 1 tablet (40 mg total) by mouth at bedtime. 90 tablet 3   sodium chloride (OCEAN) 0.65 % SOLN nasal spray Place 1 spray into both nostrils daily as needed for congestion.      STIMULANT LAXATIVE 8.6-50 MG tablet Take 2 tablets by mouth at bedtime as needed for mild constipation.      No current facility-administered medications for this visit.    Allergies:   Ace inhibitors, Other, and Propofol    Social History:  The patient  reports that she quit smoking about 10 years ago. Her smoking use included cigarettes. She has a 5.00 pack-year smoking history. She has never used smokeless tobacco. She  reports that she does not drink alcohol and does not use drugs.   Family History:  The patient's family history includes Alcohol abuse in her brother and father; Aneurysm in her mother; CVA in her mother; Cancer in her brother; Colon cancer in her brother; Diabetes in her brother and sister; Heart attack in her brother, brother, and father; Heart disease in her daughter, mother, and sister; Hypertension in her brother, father, mother, and sister; Stroke in her father and mother.    ROS:  Please see the history of present illness. (+) Chest pain (+) Tingling sensation, bilateral hands (+) Sputum production, occasionally with bleeding (+) Bilateral LE edema All other systems are reviewed and negative.    PHYSICAL EXAM: VS:  BP (!) 138/92 (BP Location: Left Arm, Patient Position: Sitting, Cuff Size: Normal)   Pulse 69   Ht 5\' 2"  (1.575 m)   Wt 141 lb (64 kg)   SpO2 97%   BMI 25.79 kg/m  , BMI Body mass index is 25.79 kg/m. GENERAL:  Well-appearing HEENT:  Pupils equal round and reactive, fundi not visualized, oral mucosa unremarkable.   NECK:  No jugular venous distention, waveform within normal limits, carotid upstroke brisk and symmetric, no bruits.  Trach in place. LUNGS:  Clear to auscultation bilaterally HEART:  RRR.  PMI not displaced or sustained,S1 and S2 within normal limits, no S3, no S4, no clicks, no rubs, II/VI holosystolic murmur at the apex ABD:  Flat, positive bowel sounds normal in frequency in pitch, no bruits, no rebound, no guarding, no midline pulsatile mass, no hepatomegaly, no splenomegaly EXT:  2 plus pulses throughout, no edema, no cyanosis no clubbing SKIN:  No rashes no nodules NEURO:  Cranial nerves II through XII grossly intact, motor grossly intact throughout [facility]:  Cognitively intact, oriented to person place and time  EKG:   09/09/2021: A-sensed, V-paced.  Rate 70 bpm. 10/04/2020: EKG was not ordered. 01/25/16: ventricularly paced at 70 bpm. Likely  atrial fibrillation.  Echo 05/09/19:  1. The left ventricle has hyperdynamic systolic function, with an ejection fraction of >65%. The cavity size was normal. There is severely increased left ventricular wall thickness. Findings are consistent with hypertrophic cardiomyopathy. Left  ventricular diastolic Doppler parameters are consistent with pseudonormalization.  2. The right ventricle has normal systolic function. The cavity was normal. There is no increase in right ventricular wall thickness.  3. Left atrial size was moderately dilated.  4. Right atrial size was moderately dilated.  5. Mitral valve regurgitation is moderate by color flow Doppler.  6. The aortic valve is tricuspid. Mild thickening of the aortic valve. Mild calcification of the aortic valve.  ICD Interrogation 01/25/16: St. Just single chamber pacemaker Battery 2.78 V Presenting rhythm is atrial fibrillation Intermittently paced at 70 bpm. Underlying rhythm atrial fibrillation with complete heart block Threshold: 1V @ 0.43ms Sensing: not performed due to complete heart block. Impedance: 436 Ohms  No events noted  Echo 11/26/15: Study Conclusions  - Left ventricle: The cavity size was normal. There was severe   concentric hypertrophy. Systolic function was vigorous. The   estimated ejection fraction was in the range of 65% to 70%. Wall   motion was normal; there were no regional wall motion   abnormalities. Features are consistent with a pseudonormal left   ventricular filling pattern, with concomitant abnormal relaxation   and increased filling pressure (grade 2 diastolic dysfunction). - Aortic valve: Trileaflet; normal thickness, mildly calcified   leaflets. - Mitral valve: There was trivial regurgitation. - Left atrium: The atrium was mildly dilated. - Atrial septum: There was increased thickness of the septum,   consistent with lipomatous hypertrophy. - Tricuspid valve: There was trivial  regurgitation.   Recent Labs: 09/18/2021: ALT 13 09/30/2021: Hemoglobin 8.8 10/01/2021: BUN 55; Creatinine, Ser 2.74; Potassium 3.6; Sodium 143    Lipid Panel    Component Value Date/Time   CHOL 208 (H) 09/18/2021 1154   TRIG 148 09/18/2021 1154   HDL 54 09/18/2021 1154   CHOLHDL 3.9 09/18/2021 1154   CHOLHDL 2.3 05/09/2019 0402   VLDL 20 05/09/2019 0402   LDLCALC 128 (H) 09/18/2021 1154   LDLDIRECT 161.8 02/21/2013 1416      Wt Readings from Last 3 Encounters:  10/28/21 141 lb (64 kg)  09/30/21 128 lb (58.1 kg)  09/09/21 128 lb (58.1 kg)      ASSESSMENT AND PLAN: Essential hypertension BP is elevated today.  We will check a BMP and BNP.  If renal function has improved, we will add an ARB for new systolic dysfunction.  CKD (chronic kidney disease) stage 3, GFR 30-59 ml/min (HCC) AKI when checked last month.  Check BMP today.  Hyperlipidemia Continue rosuvastatin.  Hypertrophic cardiomyopathy (HCC) HCM noted on echo in the last 1-2 years.  She reports several first degree relatives with heart failure.  One brother had SCD.  Her last echo showed speckled pattern concern for infiltrative cardiomyopathy.  We attempted to get a cardiac MRI but her PPM is not compatible.  Continue metoprolol.  We will get a PYP scan to assess for amyloidosis, especially given her neurologic complaints and renal dysfunction.  We will have her evaluated in ADV HF clinic and refer to genetics.  Recommended that her first degree relatives get an echo.    Current medicines are reviewed at length with the patient today.  The patient does not  have concerns regarding medicines.  The following changes have been made:  none  Labs/ tests ordered today include:   Orders Placed This Encounter  Procedures   Basic metabolic panel   B Nat Peptide   AMB referral to CHF clinic   Ambulatory referral to [facility] [facility]      Disposition:   FU with Hermen Mario C.  Oval Linsey, MD, [facility] in 6 months    Signed, Wang [place] C. Oval Linsey, MD, [facility]  10/28/2021 5:27 PM    [facility]

## 2021-10-28 NOTE — Assessment & Plan Note (Signed)
BP is elevated today.  We will check a BMP and BNP.  If renal function has improved, we will add an ARB for new systolic dysfunction.

## 2021-10-28 NOTE — Assessment & Plan Note (Signed)
HCM noted on echo in the last 1-2 years.  She reports several first degree relatives with heart failure.  One brother had SCD.  Her last echo showed speckled pattern concern for infiltrative cardiomyopathy.  We attempted to get a cardiac MRI but her PPM is not compatible.  Continue metoprolol.  We will get a PYP scan to assess for amyloidosis, especially given her neurologic complaints and renal dysfunction.  We will have her evaluated in ADV HF clinic and refer to genetics.  Recommended that her first degree relatives get an echo.

## 2021-10-28 NOTE — Assessment & Plan Note (Signed)
AKI when checked last month.  Check BMP today.

## 2021-10-28 NOTE — Assessment & Plan Note (Signed)
Continue rosuvastatin.  

## 2021-10-28 NOTE — Patient Instructions (Addendum)
Medication Instructions:  Your physician recommends that you continue on your current medications as directed. Please refer to the Current Medication list given to you today.   *If you need a refill on your cardiac medications before your next appointment, please call your pharmacy*  Lab Work: BMET/BNP TODAY   If you have labs (blood work) drawn today and your tests are completely normal, you will receive your results only by: [place] (if you have MyChart) OR A paper copy in the mail If you have any lab test that is abnormal or we need to change your treatment, we will call you to review the results.  Testing/Procedures: PYP SCAN   Follow-Up: At [facility], you and your health needs are our priority.  As part of our continuing mission to provide you with exceptional heart care, we have created designated Provider Care Teams.  These Care Teams include your primary Cardiologist (physician) and Advanced Practice Providers (APPs -  Physician Assistants and Nurse Practitioners) who all work together to provide you with the care you need, when you need it.  We recommend signing up for the patient portal called "MyChart".  Sign up information is provided on this After Visit Summary.  MyChart is used to connect with patients for Virtual Visits (Telemedicine).  Patients are able to view lab/test results, encounter notes, upcoming appointments, etc.  Non-urgent messages can be sent to your provider as well.   To learn more about what you can do with MyChart, go to [email or web address].    Your next appointment:     02/26/2022  AT 9:20 AM   You have been referred to [facility], THE OFFICE WILL CALL YOU TO SCHEDULE   You have been referred to [place]  IF YOU DO NOT HEAR FROM THE ABOVE IN 2 WEEKS PLEASE CALL THE OFFICE   Other Instructions  YOU IMMEDIATE RELATIVES NEED TO HAVE AN ECHO   Hypertrophic Cardiomyopathy Hypertrophic cardiomyopathy (HCM) is a  heart condition in which part of the heart muscle gets too thick. The condition can cause a dangerous and abnormal heart rhythm. It can also weaken the heart over time. The heart is divided into four chambers. The thickening usually affects the pumping chamber on the lower left (left ventricle). HCM may also affect the valve that lets blood flow into the left ventricle (mitral valve). Symptoms often begin when a person is about 76 years old. What are the causes? This condition is usually caused by abnormal genes that control heart muscle growth. These abnormal genes are passed down through families (are inherited). What increases the risk? You are more likely to develop this condition if you have a family history of HCM. What are the signs or symptoms? Symptoms of this condition include: Shortness of breath, especially after exercising or lying down. Chest pain. Dizziness. Feeling tired (fatigue). Irregular or fast heart rate. Fainting, especially after physical activity. Swelling in the legs, ankles, feet, abdomen, or neck veins. How is this diagnosed? This condition is diagnosed based on: A physical exam that involves checking for an abnormal heart sound (heart murmur). Tests, such as: An electrocardiogram (ECG). This is a test that records your heart's electrical activity. An echocardiogram. This test uses ultrasound to show whether your left ventricle is enlarged and whether it fills slowly. An exercise stress test. This test is done to collect information about how your heart functions during exercise. A transesophageal echocardiogram. A thin tube is placed down your esophagus to allow  detailed ultrasound pictures to be taken of your heart. A Doppler test. This is a test that shows irregular blood flow and pressure differences inside the heart. You may also have other tests, including: Genetic testing done on a blood sample. A chest X-ray to see whether your heart is enlarged. An  MRI. Holter monitor. This test is done to check for abnormal heart rhythms. How is this treated? Treatment for HCM depends on how severe your symptoms are. There are several options for treatment, including: Medicine. Medicines can be given to: Reduce the workload of your heart. Lower your blood pressure. Thin your blood and prevent clots. A device. Devices that can be used to treat this condition include: A pacemaker. This device helps to control your heartbeat. A defibrillator. This device restores a normal heart rhythm. Surgery. This may include a procedure to: Inject alcohol into the small blood vessels that supply your heart muscle (alcohol septal ablation).This is done during a type of procedure called cardiac catheterization. The goal is to cause the muscle to become thinner. Remove part of the wall that divides the right and left sides of the heart (septum) with a procedure called septal myectomy. Replace the mitral valve. Replace your heart with a healthy heart (heart transplant). Follow these instructions at home: Activity Avoid exercise and activities that take a lot of effort, such as shoveling snow. Do not participate in high-intensity activities, such as competitive sports. Get regular physical activity. Most people with this condition can participate in exercise at low to moderate intensity levels, such as walking. Ask your health care provider what activity level is safe for you. Lifestyle Maintain a healthy weight. If you need help with losing weight, ask your health care provider. Eat a heart-healthy diet. Do not drink alcohol if: Your health care provider tells you not to drink. You are pregnant, may be pregnant, or are planning to become pregnant. If you drink alcohol: Limit how much you have to: 0-1 drink a day for women. 0-2 drinks a day for men. Know how much alcohol is in your drink. In the [place] one drink equals one 12 oz bottle of beer (355 mL), one 5 oz glass  of wine (148 mL), or one 1 oz glass of hard liquor (44 mL). Do not use any products that contain nicotine or tobacco. These products include cigarettes, chewing tobacco, and vaping devices, such as e-cigarettes. If you need help quitting, ask your health care provider. General instructions Make sure the members of your household know how to do CPR in case of an emergency. Take over-the-counter and prescription medicines only as told by your health care provider. Keep all follow-up visits. This is important. Contact a health care provider if: You have new symptoms. Your symptoms get worse. Get help right away if: You have chest pain or shortness of breath, especially during or after exercise. You feel faint or you pass out. You have trouble breathing even at rest. Your feet or ankles swell. Your heartbeat seems irregular or seems faster than normal (palpitations). These symptoms may represent a serious problem that is an emergency. Do not wait to see if the symptoms will go away. Get medical help right away. Call your local emergency services (911 in the [place] Do not drive yourself to the hospital. Summary Hypertrophic cardiomyopathy (HCM) is a heart condition in which part of the heart muscle gets too thick. The condition can cause a dangerous and abnormal heart rhythm, and it can weaken the heart over  time. Avoid exercise and activities that take a lot of effort, such as shoveling snow. Make sure the members of your household know how to do CPR in case of an emergency. Keep all follow-up visits. This is important. This information is not intended to replace advice given to you by your health care provider. Make sure you discuss any questions you have with your health care provider. Document Revised: 05/27/2021 Document Reviewed: 05/27/2021 Elsevier Patient Education  [place].

## 2021-10-29 LAB — BASIC METABOLIC PANEL
BUN/Creatinine Ratio: 11 — ABNORMAL LOW (ref 12–28)
BUN: 23 mg/dL (ref 8–27)
CO2: 23 mmol/L (ref 20–29)
Calcium: 9.6 mg/dL (ref 8.7–10.3)
Chloride: 110 mmol/L — ABNORMAL HIGH (ref 96–106)
Creatinine, Ser: 2.19 mg/dL — ABNORMAL HIGH (ref 0.57–1.00)
Glucose: 106 mg/dL — ABNORMAL HIGH (ref 70–99)
Potassium: 3.8 mmol/L (ref 3.5–5.2)
Sodium: 148 mmol/L — ABNORMAL HIGH (ref 134–144)
eGFR: 23 mL/min/{1.73_m2} — ABNORMAL LOW (ref >59)

## 2021-10-29 LAB — BRAIN NATRIURETIC PEPTIDE: BNP: 447.6 pg/mL — ABNORMAL HIGH (ref 0.0–100.0)

## 2021-11-15 ENCOUNTER — Telehealth (HOSPITAL_BASED_OUTPATIENT_CLINIC_OR_DEPARTMENT_OTHER): Payer: Self-pay | Admitting: *Deleted

## 2021-11-15 DIAGNOSIS — I10 Essential (primary) hypertension: Secondary | ICD-10-CM

## 2021-11-15 DIAGNOSIS — Z5181 Encounter for therapeutic drug level monitoring: Secondary | ICD-10-CM

## 2021-11-15 DIAGNOSIS — R0602 Shortness of breath: Secondary | ICD-10-CM

## 2021-11-15 MED ORDER — FUROSEMIDE 20 MG PO TABS: 20.0000 mg | ORAL_TABLET | Freq: Every day | ORAL | 3 refills | Status: DC

## 2021-11-15 NOTE — Telephone Encounter (Signed)
Advised [name] daughter, ok per [facility]

## 2021-11-15 NOTE — Telephone Encounter (Signed)
-----   Message from Skeet Latch, MD sent at 11/15/2021  9:21 AM EST ----- Kidney function is going in the right direction, but it is still above her baseline.  Her heart failure labs were elevated. Recommend adding lasix 20mg  daily.  Then repeat BMP and BNP in a week.

## 2021-11-19 ENCOUNTER — Telehealth (HOSPITAL_COMMUNITY): Payer: Self-pay | Admitting: *Deleted

## 2021-11-19 NOTE — Telephone Encounter (Signed)
Close encounter 

## 2021-11-20 ENCOUNTER — Other Ambulatory Visit: Payer: Self-pay

## 2021-11-20 ENCOUNTER — Ambulatory Visit (HOSPITAL_COMMUNITY)
Admission: RE | Admit: 2021-11-20 | Discharge: 2021-11-20 | Disposition: A | Discharge status: Home / Self Care | Payer: Medicare (Managed Care) | Source: Ambulatory Visit | Attending: Cardiovascular Disease | Admitting: Cardiovascular Disease

## 2021-11-20 DIAGNOSIS — I421 Obstructive hypertrophic cardiomyopathy: Secondary | ICD-10-CM | POA: Diagnosis present

## 2021-11-20 DIAGNOSIS — R0602 Shortness of breath: Secondary | ICD-10-CM | POA: Insufficient documentation

## 2021-11-20 MED ORDER — TECHNETIUM TC 99M TETROFOSMIN IV KIT
21.7000 | PACK | Freq: Once | INTRAVENOUS | Status: AC | PRN
  Administered 2021-11-20: 21.7 via INTRAVENOUS
  Filled 2021-11-20: qty 22

## 2021-12-03 ENCOUNTER — Encounter (HOSPITAL_BASED_OUTPATIENT_CLINIC_OR_DEPARTMENT_OTHER): Payer: Self-pay

## 2021-12-24 ENCOUNTER — Ambulatory Visit: Payer: Medicare (Managed Care) | Admitting: Genetic Counselor

## 2021-12-24 ENCOUNTER — Other Ambulatory Visit: Payer: Self-pay

## 2021-12-25 NOTE — Progress Notes (Signed)
Pre Test GC  Referring Provider: Skeet Latch, MD   Referral Reason  [name] was referred for genetic consult and testing of hypertrophic cardiomyopathy.  Personal Medical Information [name] (II.5 on pedigree) is a 76 y.o. African American woman who is here today with her older daughter, [name] (III.5). [name] tells me that her mother had a pacemaker since the age of 69. Both she and her mother are not clear as to the reason for having a pacemaker and tells me that her maternal grandfather (I.1) had one as well as nearly all her mother's siblings, except an uncle (II.11).  [name] was diagnosed with HCM nearly 10 years ago, at age 39. She reports having symptoms of dyspnea, heart palpitations, fatigue, chest tightness and dizziness at the time of her diagnosis and tells me that her symptoms have not changed in intensity since then. Her recent echocardiogram detected severe concentric LVH with features suspicious of amyloidosis. However, her cardiac PYP screening was negative for amyloidosis.   Traditional Risk Factors [name] was diagnosed with hypertension in her 30s/40s. Her daughter tells me that her BP fluctuated in the first few years and is now controlled with medication.   Family history [name] (II.5) has three daughters, ages 61, 30 and 50. Her 54 y.o. daughter, [name] is with her today. The younger two daughters do not have the same father as [name]. [name] tells me that she and her half-sisters do not have any major health issues and have not been screened for HCM. [name] has a 58 y.o daughter (IV.1) and a 19 y.o. granddaughter (V.1)- both are in good health.  [name] is one of 12 children. She tells me that all her siblings, except her 18 y.o brother (II.11) have a pacemaker. She also reports a sister, [name] (II.10) who was found to have an enlarged heart in her 55s. She died of kidney failure in her 59s. Her son, age 42 (III.8) and his children are in good health. No reports of HCM or sudden death  amongst her other siblings. However, her brother's daughters (III.1, III.2) died in their sleep at age 73 and 1s. An autopsy was not conducted to determine the cause of death. Their father (II.2) died in his 53s from a heart attack or stroke. She has three living siblings- two sisters, ages 1 and 66 (II.3, II.38) and a brother, age 51 (II.11)  [name]'s father (I.1) died at 69 from a heart attack/stroke and mother (I.2) died of stroke in her 61s.  Genetics [name] was counseled on the genetics of hypertrophic cardiomyopathy (HCM). I explained to her that this is an autosomal dominant condition and hence her daughters have a 50% chance of inheriting HCM. I explained to her that her first degree-relatives, namely daughters should seek regular surveillance for HCM.  Clinical screening involves echocardiogram and EKG at regular intervals, frequency is typically determined by age, with children in their teens being screened every 15-18 months and those over the age of 74 getting screened every 3-5 years. They verbalized understanding of this.   I discussed penetrance of HCM being incomplete i.e. not all individuals harboring a HCM mutation will present clinically with HCM, and age-related penetrance where clinical presentation of HCM increases with advanced age. Based on her family history, it is highly unlikely that she has inherited HCM as both parents lived to their 73s and 87s without having clinical signs and symptoms of HCM. In light of age-related penetrance, it is reasonable to expect one of the parents with clinical  presentation of HCM. She verbalized understanding.   I also reviewed variable expression of HCM and emphasized that this condition can express at any age at any level of severity in the family. Hence, it is important for her first-degree relatives to stay vigilant and seek regular surveillance for HCM. She verbalized understanding of this. I informed her that some patients - about 8-10% can  have compound and digenic mutations for HCM. Also briefly discussed the inheritance pattern and treatment /management plans for the infiltrative cardiomyopathies that present as HCM phenocopies.   We walked through the process of genetic testing.   I explained to her that genetic testing is a probabilistic test dependent upon her age and severity of presentation, presence of risk factors for HCM and importantly family history of HCM or sudden death in first-degree relatives.  The potential outcomes of genetic testing and subsequent management of at-risk family members were discussed so as to manage expectations-  I explained to her that if a mutation is not identified, then all first-degree relatives should undergo regular screening for HCM. I emphasized that even if the genetic test is negative, it does not mean that she does not have HCM. A negative test result can be due to limitations of the genetic test. She verbalized understanding of this.  There is also the likelihood of identifying a Variant of unknown significance. This result means that the variant has not been detected in a statistically significant number of HCM patients and/or functional studies have not been performed to verify its pathogenicity. This VUS can be tested in the family to see if it segregates with disease. If a VUS is found, first-degree relatives should undergo regular clinical screening for HCM.  If a pathogenic variant is reported, then her first-degree family members can get tested for this variant. If they test positive, it is likely they will develop HCM. In light of variable expression and incomplete penetrance associated with HCM, it is not possible to predict when they will manifest clinically with HCM. It is recommended that family members that test positive for the familial pathogenic variant pursue clinical screening for HCM. Family members that test negative for the familial mutation need not pursue periodic  screening for HCM, but seek care if symptoms develop.   Impression  [name] was found to have cardiac wall thickness suspicious of HCM at age 77 but with underlying risk factors of HCM and concentric LVH suspicious of hypertensive heart disease. Additionally, there is no family history of sudden death and HCM in her first-degree relatives, though she mentions 2 second-degree relatives with sudden death. Genetic testing for HCM is recommended. This test should include the major genes for HCM as well as the HCM phenocopies of Fabry disease, Danon disease, Wolf-Parkinson White syndrome and FTA. Genetic testing will confirm if she has a sarcomeric gene mutation or a HCM phenocopy.  In addition, we discussed the protections afforded by the Genetic Information Non-Discrimination Act (GINA). I explained to her that GINA protects her from losing her employment or health insurance based on her genotype. However, these protections do not cover life insurance and disability. She verbalized understanding of this and states that her daughter has life insurance.  Please note that the patient has not been counseled in this visit on personal, cultural or ethical issues that she may face due to her heart condition.   Plan [name]'s insurance, Medicare will not cover her genetic testing for HCM.  I informed her of other labs that can perform  the test at a very reduced price or at no cost but that her genetic data may likely be shared with other entities. She declines genetic testing for HCM. [name] tells me that she and her half-sisters will undergo regular surveillance for HCM.   [name], Ph.D, [facility] Clinical Molecular Geneticist

## 2022-01-15 ENCOUNTER — Other Ambulatory Visit: Payer: Medicare (Managed Care) | Admitting: *Deleted

## 2022-01-15 ENCOUNTER — Other Ambulatory Visit: Payer: Self-pay

## 2022-01-15 ENCOUNTER — Encounter (HOSPITAL_COMMUNITY): Payer: Self-pay

## 2022-01-15 VITALS — BP 103/75 | HR 69 | Temp 97.6°F | Resp 18

## 2022-01-15 DIAGNOSIS — Z515 Encounter for palliative care: Secondary | ICD-10-CM

## 2022-01-15 NOTE — Progress Notes (Signed)
[facility] COMMUNITY PALLIATIVE CARE RN NOTE  PATIENT NAMEHawraa [name] DOB: Nov 18, 1946 MRN: [id number]  PRIMARY CARE PROVIDER: [facility] [place]  RESPONSIBLE PARTY: [name] (daughter) Acct ID - Guarantor Home Phone Work Phone Relationship Acct Type  [id number] [name] [phone]  Self P/F     [address], [place], [place] [address]   Covid-19 Pre-screening Negative  PLAN OF CARE and INTERVENTION:  ADVANCE CARE PLANNING/GOALS OF CARE: Goal is for patient to remain in her home as long as possible. She has a MOST form. PATIENT/CAREGIVER EDUCATION: Symptom management, safe mobility, s/s of infection DISEASE STATUS: RN completed palliative care follow up visit with patient today. Caregiver-[name] present in the home at time of visit. She is provided through [facility] and comes every day except Thursdays for a few hours each day. [facility]. Patient is alert and oriented x 2, able to answer simple questions and make her needs known. She does have some word-finding difficulties and is forgetful. She denies pain. She does experience shortness of breath at times with exertion but is able to recover quickly at rest. She has a tracheostomy. She has an occasional productive cough. Phlegm is white. She is mainly independent with ADLs. She ambulates using a cane when outside of her home for increased stability. She feels her appetite is fairly good. She is eating 2-3 meals/day.She is taking her medications whole with water. She does admit to sometimes coughing when she eats/drinks but is not excessive at this time. She denies any urinary and bowel issues at this time. She will occasional have constipation. No swelling noted. No skin issues. Overall patient feels that her condition is stable. Will continue to follow.   CODE STATUS: Full code ADVANCED DIRECTIVES: Y MOST FORM: yes PPS: 50%   PHYSICAL EXAM:   VITALS: Today's  Vitals   01/15/22 1151  BP: 103/75  Pulse: 69  Resp: 18  Temp: 97.6 F (36.4 C)  TempSrc: Temporal  SpO2: 100%  PainSc: 0-No pain    LUNGS: clear to auscultation  CARDIAC: Cor RRR EXTREMITIES: No edema SKIN:  Exposed skin is dry and intact   NEURO:  Alert and oriented to person/place, forgetful, word-finding difficulties, ambulatory    (Duration of visit and documentation 45 minutes)    Daryl Eastern, RN BSN

## 2022-02-06 NOTE — Telephone Encounter (Signed)
error 

## 2022-02-26 ENCOUNTER — Ambulatory Visit (HOSPITAL_BASED_OUTPATIENT_CLINIC_OR_DEPARTMENT_OTHER): Payer: Medicare (Managed Care) | Admitting: Cardiovascular Disease

## 2022-03-06 NOTE — Progress Notes (Signed)
?[facility] Note ? ?03/12/2022 ? ?  ? ?CHIEF COMPLAINT ?Patient presents for Retina Follow Up ? ?HISTORY OF PRESENT ILLNESS: ?[name] is a 76 y.o. female who presents to the clinic today for:  ? ?HPI   ? ? Retina Follow Up   ?Patient presents with  Other (HTN retinopathy).  In right eye.  Severity is moderate.  Duration of 6 months.  Course: OD fluctuates at times, OS stable.  I, the attending physician,  performed the HPI with the patient and updated documentation appropriately. ? ?  ?  ? ? Comments   ?Patient states vision fluctuates OD, especially when waking up in the morning. Vision OS seems stable. Using lumighan qhs OU and brimonidine bid OD.  ? ?  ?  ?Last edited by [name], MD on 03/12/2022  2:37 PM.  ?  ?Pt states no change in vision ? ?Referring physician: ?[facility], [place] ?[place],  [place] [address] ? ?HISTORICAL INFORMATION:  ? ?Selected notes from the [place] ?Referred by Dr. Herbert Deaner for ret eval  ? ?CURRENT MEDICATIONS: ?Current Outpatient Medications (Ophthalmic Drugs)  ?Medication Sig  ? bimatoprost (LUMIGAN) 0.01 % SOLN Place 1 drop into both eyes at bedtime.  ? brimonidine (ALPHAGAN) 0.2 % ophthalmic solution Place 1 drop into the right eye in the morning and at bedtime.  ? Olopatadine HCl 0.2 % SOLN Place 1-2 drops into both eyes daily as needed (itching).  (Patient not taking: Reported on 03/12/2022)  ? ?No current facility-administered medications for this visit. (Ophthalmic Drugs)  ? ?Current Outpatient Medications (Other)  ?Medication Sig  ? acetaminophen (TYLENOL) 650 MG CR tablet Take 650 mg by mouth every 8 (eight) hours as needed for pain.   ? albuterol (PROVENTIL HFA;VENTOLIN HFA) 108 (90 Base) MCG/ACT inhaler Inhale 1-2 puffs into the lungs every 6 (six) hours as needed for wheezing or shortness of breath.  ? ALPRAZolam (XANAX) 0.5 MG tablet Take 1 tablet (0.5 mg total) by mouth at bedtime  as needed for anxiety.  ? apixaban (ELIQUIS) 2.5 MG TABS tablet Take 1 tablet (2.5 mg total) by mouth 2 (two) times daily.  ? cetirizine (ZYRTEC) 10 MG tablet Take 10 mg by mouth daily as needed for allergies.   ? donepezil (ARICEPT ODT) 10 MG disintegrating tablet Take 10 mg by mouth at bedtime.  ? Epoetin Alfa-epbx (RETACRIT IJ) Inject 1 Dose as directed once a week.   ? ferrous sulfate 325 (65 FE) MG tablet TAKE 1 TABLET BY MOUTH TWICE DAILY WITH MEALS TO KEEP BLOOD COUNT UP  ? ipratropium-albuterol (DUONEB) 0.5-2.5 (3) MG/3ML SOLN USE 3 ML VIA NEBULIZER EVERY 6 HOURS (Patient taking differently: Inhale 3 mLs into the lungs every 6 (six) hours as needed (wheezing/shortness of breath).)  ? metoprolol tartrate (LOPRESSOR) 25 MG tablet Take 1 tablet (25 mg total) by mouth 2 (two) times daily.  ? mirtazapine (REMERON) 7.5 MG tablet Take 7.5 mg by mouth at bedtime.  ? nitroGLYCERIN (NITROSTAT) 0.4 MG SL tablet Place 1 tablet (0.4 mg total) under the tongue every 5 (five) minutes as needed for chest pain.  ? oxymetazoline (AFRIN NASAL SPRAY) 0.05 % nasal spray Place 1 spray into both nostrils 2 (two) times daily. (Patient taking differently: Place 1 spray into both nostrils 2 (two) times daily as needed for congestion.)  ? rosuvastatin (CRESTOR) 40 MG tablet Take 1 tablet (40 mg total) by mouth at bedtime.  ?  sodium chloride (OCEAN) 0.65 % SOLN nasal spray Place 1 spray into both nostrils daily as needed for congestion.   ? STIMULANT LAXATIVE 8.6-50 MG tablet Take 2 tablets by mouth at bedtime as needed for mild constipation.   ? allopurinol (ZYLOPRIM) 100 MG tablet TAKE 2 TABLETS(200 MG) BY MOUTH DAILY (Patient not taking: Reported on 03/12/2022)  ? furosemide (LASIX) 20 MG tablet Take 1 tablet (20 mg total) by mouth daily.  ? ?No current facility-administered medications for this visit. (Other)  ? ?REVIEW OF SYSTEMS: ?ROS   ?Positive for: Skin, Genitourinary, Musculoskeletal, Cardiovascular, Eyes, Respiratory,  Psychiatric, Allergic/Imm ?Negative for: Constitutional, Gastrointestinal, Neurological, HENT, Endocrine, Heme/Lymph ?Last edited by Roselee Nova D, COT on 03/12/2022  1:42 PM.  ?  ? ?ALLERGIES ?Allergies  ?Allergen Reactions  ? Ace Inhibitors Swelling  ?  angiodema  ? Other Anaphylaxis and Swelling  ?  Reaction to unspecified anesthesia   ? Propofol   ?  Dysrhythmia likely to drop in afterload post induction with Propofol. Did not have anaphylactic symptoms with MAC in 2018  ? ?PAST MEDICAL HISTORY ?Past Medical History:  ?Diagnosis Date  ? Anemia   ? Angioedema   ? 2/2 ACE  ? Arteriovenous malformation of stomach   ? Arthritis   ? Asthma   ? AVM (arteriovenous malformation) of colon   ? small intestine; stomach  ? Blood transfusion   ? CHF (congestive heart failure) ([place])   ? Complete heart block (HCC)   ? s/p PPM 1998  ? Complication of anesthesia   ? Difficult airway; anaphylaxis and swelling with propofol  ? DDD (degenerative disc disease)   ? Depression   ? Diastolic heart failure   ? Fatty liver 07/26/2010  ? GERD (gastroesophageal reflux disease)   ? GI bleed   ? History of alcohol abuse Stopped Fall 2012  ? History of tobacco use Quit Fall 2012  ? Hx of cardiovascular stress test   ? a. Lexiscan Myoview (10/15):  Small inferolateral and apical defect c/w scar and poss soft tissue attenuation, no ischemia, EF 42%  ? Hx of colonic polyp 08/13/2010  ? adenomatous  ? Hx of colonoscopy   ? Hyperlipidemia   ? Hypertension   ? Hypertrophic cardiomyopathy ([place])   ? dx by Dr [name] 2009  ? Iron deficiency anemia   ? Myocardial infarction [facility])   ? Pacemaker   ? Panic attacks   ? Permanent atrial fibrillation ([place])   ? Renal failure   ? baseline creatinine 1.6  ? Right arm pain 01/08/2012  ? RLS (restless legs syndrome)   ? Dx 06/2007  ? Sleep apnea   ? no cpap   ? ?Past Surgical History:  ?Procedure Laterality Date  ? BIOPSY  09/04/2020  ? Procedure: BIOPSY;  Surgeon: Mauri Pole, MD;  [facility];   Service: Endoscopy;;  ? CARDIAC CATHETERIZATION    ? CARDIAC CATHETERIZATION N/A 08/19/2016  ? Procedure: Right/Left Heart Cath and Coronary Angiography;  Surgeon: Jolaine Artist, MD;  Location: [place] [facility];  Service: Cardiovascular;  Laterality: N/A;  ? COLONOSCOPY WITH PROPOFOL N/A 06/16/2017  ? Procedure: COLONOSCOPY WITH PROPOFOL;  Surgeon: Mauri Pole, MD;  [facility];  Service: Endoscopy;  Laterality: N/A;  ? COLONOSCOPY WITH PROPOFOL N/A 09/04/2020  ? Procedure: COLONOSCOPY WITH PROPOFOL;  Surgeon: Mauri Pole, MD;  [facility];  Service: Endoscopy;  Laterality: N/A;  ? DIRECT LARYNGOSCOPY N/A 09/18/2016  ? Procedure: DIRECT LARYNGOSCOPY, BRONCHOSCOPY,  REMOVAL OF INTUBATION GRANULOMA;  Surgeon: [name], MD;  Location: [place];  Service: ENT;  Laterality: N/A;  ? DIRECT LARYNGOSCOPY N/A 10/20/2016  ? Procedure: EXTUBATION AND FLEXIBLE LARYNGOSCOPE;  Surgeon: [name], MD;  Location: [place];  Service: ENT;  Laterality: N/A;  ? DIRECT LARYNGOSCOPY N/A 10/29/2016  ? Procedure: DIRECT LARYNGOSCOPY;  Surgeon: [name], MD;  Location: [place];  Service: ENT;  Laterality: N/A;  ? ENTEROSCOPY N/A 09/30/2021  ? Procedure: ENTEROSCOPY;  Surgeon: Yetta Flock, MD;  [facility];  Service: Gastroenterology;  Laterality: N/A;  ? ESOPHAGOGASTRODUODENOSCOPY  12/23/2011  ? Procedure: ESOPHAGOGASTRODUODENOSCOPY (EGD);  Surgeon: [name], MD;  [facility];  Service: Endoscopy;  Laterality: N/A;  ? ESOPHAGOGASTRODUODENOSCOPY (EGD) WITH PROPOFOL N/A 06/16/2017  ? Procedure: ESOPHAGOGASTRODUODENOSCOPY (EGD) WITH PROPOFOL;  Surgeon: Mauri Pole, MD;  [facility];  Service: Endoscopy;  Laterality: N/A;  ? ESOPHAGOGASTRODUODENOSCOPY (EGD) WITH PROPOFOL N/A 09/04/2020  ? Procedure: ESOPHAGOGASTRODUODENOSCOPY (EGD) WITH PROPOFOL;  Surgeon: Mauri Pole, MD;  [facility];  Service: Endoscopy;  Laterality: N/A;  ?  EXTUBATION (ENDOTRACHEAL) IN OR N/A 07/21/2016  ? Procedure: EXTUBATION (ENDOTRACHEAL) IN OR;  Surgeon: [name], MD;  Location: [place];  Service: ENT;  Laterality: N/A;  ? GIVENS CAPSULE STUDY  12/23/2011  ? Pr

## 2022-03-12 ENCOUNTER — Ambulatory Visit (INDEPENDENT_AMBULATORY_CARE_PROVIDER_SITE_OTHER): Payer: Medicare (Managed Care) | Admitting: Ophthalmology

## 2022-03-12 ENCOUNTER — Encounter (INDEPENDENT_AMBULATORY_CARE_PROVIDER_SITE_OTHER): Payer: Self-pay | Admitting: Ophthalmology

## 2022-03-12 DIAGNOSIS — H401130 Primary open-angle glaucoma, bilateral, stage unspecified: Secondary | ICD-10-CM

## 2022-03-12 DIAGNOSIS — Z961 Presence of intraocular lens: Secondary | ICD-10-CM | POA: Diagnosis not present

## 2022-03-12 DIAGNOSIS — H35033 Hypertensive retinopathy, bilateral: Secondary | ICD-10-CM | POA: Diagnosis not present

## 2022-03-12 DIAGNOSIS — I10 Essential (primary) hypertension: Secondary | ICD-10-CM

## 2022-03-12 DIAGNOSIS — H43811 Vitreous degeneration, right eye: Secondary | ICD-10-CM

## 2022-03-19 ENCOUNTER — Encounter (HOSPITAL_BASED_OUTPATIENT_CLINIC_OR_DEPARTMENT_OTHER): Payer: Self-pay | Admitting: Cardiovascular Disease

## 2022-03-19 ENCOUNTER — Ambulatory Visit (INDEPENDENT_AMBULATORY_CARE_PROVIDER_SITE_OTHER): Payer: Medicare (Managed Care) | Admitting: Cardiovascular Disease

## 2022-03-19 VITALS — BP 100/64 | HR 71 | Ht 62.0 in | Wt 124.6 lb

## 2022-03-19 DIAGNOSIS — I10 Essential (primary) hypertension: Secondary | ICD-10-CM

## 2022-03-19 DIAGNOSIS — Z5181 Encounter for therapeutic drug level monitoring: Secondary | ICD-10-CM | POA: Diagnosis not present

## 2022-03-19 DIAGNOSIS — I509 Heart failure, unspecified: Secondary | ICD-10-CM

## 2022-03-19 DIAGNOSIS — I422 Other hypertrophic cardiomyopathy: Secondary | ICD-10-CM

## 2022-03-19 DIAGNOSIS — E7800 Pure hypercholesterolemia, unspecified: Secondary | ICD-10-CM

## 2022-03-19 DIAGNOSIS — I4821 Permanent atrial fibrillation: Secondary | ICD-10-CM

## 2022-03-19 NOTE — Patient Instructions (Addendum)
Medication Instructions:  ?Your physician recommends that you continue on your current medications as directed. Please refer to the Current Medication list given to you today.  ? ?*If you need a refill on your cardiac medications before your next appointment, please call your pharmacy* ? ?Lab Work: ?FASTING LP/CMET EARLY ONE MORNING  ?SOON AT PACE ? ?If you have labs (blood work) drawn today and your tests are completely normal, you will receive your results only by: ?MyChart Message (if you have MyChart) OR ?A paper copy in the mail ?If you have any lab test that is abnormal or we need to change your treatment, we will call you to review the results. ? ?Testing/Procedures: ?NONE ? ?Follow-Up: ?At [facility], you and your health needs are our priority.  As part of our continuing mission to provide you with exceptional heart care, we have created designated Provider Care Teams.  These Care Teams include your primary Cardiologist (physician) and Advanced Practice Providers (APPs -  Physician Assistants and Nurse Practitioners) who all work together to provide you with the care you need, when you need it. ? ?We recommend signing up for the patient portal called "MyChart".  Sign up information is provided on this After Visit Summary.  MyChart is used to connect with patients for Virtual Visits (Telemedicine).  Patients are able to view lab/test results, encounter notes, upcoming appointments, etc.  Non-urgent messages can be sent to your provider as well.   ?To learn more about what you can do with MyChart, go to [email or web address].   ? ?Your next appointment:   ?6 month(s) ? ?The format for your next appointment:   ?In Person ? ?Provider:   ?Skeet Latch, MD{ ? ?

## 2022-03-19 NOTE — Progress Notes (Signed)
? ? ?Cardiology Office Note ? ?Date:  03/19/2022  ? ?ID:  [name], DOB 12/21/1945, MRN [id number] ? ?[name]:  [facility], [place]  ?Cardiologist:   Skeet Latch, MD  ?Electrophysiologist: [name] Grayer, MD ? ? ?No chief complaint on file. ? ?Patient ID: ?[name] is a 76 y.o. female with hypertrophic cardiomyopathy, hypertension, hyperlipidemia, CKD IV, chronic atrial fibrillation, (not anticoagulated due to GI bleed), chronic diastolic heart failure, complete heart block status post pacemaker ([employer]), and COPD who presents for follow up.   ? ?History of Present Illness: [name] was first seen on 12/14/15 as a referral from [name]. She was referred to general cardiology to establish care. She had been admitted to the hospital earlier that month with a complaint of chest pain and was noted to be mildly volume overloaded. Her BNP was 436 and echo revealed normal systolic function with grade 2 diastolic dysfunction. She was diuresed with IV Lasix and developed acute renal failure. Troponin was mildly elevated to 0.19.  Lexiscan Cardiolite was negative for ischemia.  [name] continued to struggle with palpitations and dizziness.  She has been in chronic atrial fibrillation and has not been on anticoagulation due to a GI bleed in 2012. She has known AVMs. She was referred for consideration of a Watchman device 02/6143, which was complicated by acute respiratory distress during the TEE that required intubation.  She developed acute biventricular failure that eventually normalized.  She was discharged to a SNF and was then readmitted with volume overload.  She has struggled with laryngeal stenosis managed by her ENT in [facility].  She is not a candidate for removal of her trach due to residual stenosis.  She has also been admitted with stroke 04/2019 with residual expressive aphasia.  She was subsequently started on Eliquis despite her GI bleed history.   The following month she noted blood in her stool but h/h was stable.  She was noted to have acute on chronic renal failure and was referred to nephrology.  She last saw [name] 05/2019 and was doing well from a cardiac standpoint, but was frustrated by expressive aphasia.  ? ?Metoprolol was added due to HCM, palpitations, and dyspnea.  Her palpitations improved.  She saw ENT at [facility] and was thinking about haivng the trach removed. She followed up with [name] Adas, PA on 08/2021. There were continued questions about stopping Eliquis due to chronic GI bleeding requiring transfusions. Eliquis was reduced to 2.5 mg to try to lower her bleeding risk.  At her last appointment she reported tingling in bilateral hands and occasional chest pain.  She was referred for an echo which revealed LVEF 40 to 45% with severe concentric LVH and speckled myocardium concerning for infiltrative cardiomyopathy.  The right ventricle was speckled as well.  She was referred for cardiac MRI but could not get it because of her ICD.  She notes a strong family history of many family members with heart failure.  1 brother did die of sudden cardiac death. ? ?She had small bowel enteroscopy with [name] on 09/30/2021.  She was found to have a single small angiodysplastic lesion in the gastric body.  It was not bleeding at the time.  He was treated with APC.  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  There were 4 lesions treated in the duodenum, 1 in the jejunum she was instructed to resume her Eliquis the  following day.  She had a PYP scan 11/2021 that was negative for amyloid. BNP was elevated and she was started back on lasix. She saw our geneticist but declined genetic testing due to cost. She saw her retina sepcicalist and had severre hypertensive retinopathy. They recommended tight blood pressure control.  ? ?Today, she is accompanied by her daughter. ?She reports she has been very  fatigued recently. Her last blood count was low. Lasix and allopurinol was stopped by her [name]. She has not noticed any swelling since she stopped. She reports that while walking, she often feels short of breath and has to stop to rest. She also experiences chest pain when she is moving around and has a hard time getting her words out. ?Her blood pressure is low today and her daughter reports they  ?have been low at her last doctor visits. ?BP Readings from Last 3 Encounters:  ?03/19/22 100/64  ?01/15/22 103/75  ?10/28/21 (!) 138/92  ?  ?Her appetite is better than it was at the last visit but still not at goal. ?She is taking 40 mg Crestor to manage her cholesterol levels. ? ?Past Medical History:  ?Diagnosis Date  ? Anemia   ? Angioedema   ? 2/2 ACE  ? Arteriovenous malformation of stomach   ? Arthritis   ? Asthma   ? AVM (arteriovenous malformation) of colon   ? small intestine; stomach  ? Blood transfusion   ? CHF (congestive heart failure) ([place])   ? Complete heart block (HCC)   ? s/p PPM 1998  ? Complication of anesthesia   ? Difficult airway; anaphylaxis and swelling with propofol  ? DDD (degenerative disc disease)   ? Depression   ? Diastolic heart failure   ? Fatty liver 07/26/2010  ? GERD (gastroesophageal reflux disease)   ? GI bleed   ? History of alcohol abuse Stopped Fall 2012  ? History of tobacco use Quit Fall 2012  ? Hx of cardiovascular stress test   ? a. Lexiscan Myoview (10/15):  Small inferolateral and apical defect c/w scar and poss soft tissue attenuation, no ischemia, EF 42%  ? Hx of colonic polyp 08/13/2010  ? adenomatous  ? Hx of colonoscopy   ? Hyperlipidemia   ? Hypertension   ? Hypertrophic cardiomyopathy ([place])   ? dx by Dr [name] 2009  ? Iron deficiency anemia   ? Myocardial infarction [facility])   ? Pacemaker   ? Panic attacks   ? Permanent atrial fibrillation ([place])   ? Renal failure   ? baseline creatinine 1.6  ? Right arm pain 01/08/2012  ? RLS (restless legs syndrome)   ? Dx 06/2007  ?  Sleep apnea   ? no cpap   ? ? ?Past Surgical History:  ?Procedure Laterality Date  ? BIOPSY  09/04/2020  ? Procedure: BIOPSY;  Surgeon: Mauri Pole, MD;  [facility];  Service: Endoscopy;;  ? CARDIAC CATHETERIZATION    ? CARDIAC CATHETERIZATION N/A 08/19/2016  ? Procedure: Right/Left Heart Cath and Coronary Angiography;  Surgeon: Jolaine Artist, MD;  Location: [place] [facility];  Service: Cardiovascular;  Laterality: N/A;  ? COLONOSCOPY WITH PROPOFOL N/A 06/16/2017  ? Procedure: COLONOSCOPY WITH PROPOFOL;  Surgeon: Mauri Pole, MD;  [facility];  Service: Endoscopy;  Laterality: N/A;  ? COLONOSCOPY WITH PROPOFOL N/A 09/04/2020  ? Procedure: COLONOSCOPY WITH PROPOFOL;  Surgeon: Mauri Pole, MD;  [facility];  Service: Endoscopy;  Laterality: N/A;  ? DIRECT LARYNGOSCOPY  N/A 09/18/2016  ? Procedure: DIRECT LARYNGOSCOPY, BRONCHOSCOPY, REMOVAL OF INTUBATION GRANULOMA;  Surgeon: [name], MD;  Location: [place];  Service: ENT;  Laterality: N/A;  ? DIRECT LARYNGOSCOPY N/A 10/20/2016  ? Procedure: EXTUBATION AND FLEXIBLE LARYNGOSCOPE;  Surgeon: [name], MD;  Location: [place];  Service: ENT;  Laterality: N/A;  ? DIRECT LARYNGOSCOPY N/A 10/29/2016  ? Procedure: DIRECT LARYNGOSCOPY;  Surgeon: [name], MD;  Location: [place];  Service: ENT;  Laterality: N/A;  ? ENTEROSCOPY N/A 09/30/2021  ? Procedure: ENTEROSCOPY;  Surgeon: Yetta Flock, MD;  [facility];  Service: Gastroenterology;  Laterality: N/A;  ? ESOPHAGOGASTRODUODENOSCOPY  12/23/2011  ? Procedure: ESOPHAGOGASTRODUODENOSCOPY (EGD);  Surgeon: [name], MD;  [facility];  Service: Endoscopy;  Laterality: N/A;  ? ESOPHAGOGASTRODUODENOSCOPY (EGD) WITH PROPOFOL N/A 06/16/2017  ? Procedure: ESOPHAGOGASTRODUODENOSCOPY (EGD) WITH PROPOFOL;  Surgeon: Mauri Pole, MD;  [facility];  Service: Endoscopy;  Laterality: N/A;  ? ESOPHAGOGASTRODUODENOSCOPY (EGD) WITH  PROPOFOL N/A 09/04/2020  ? Procedure: ESOPHAGOGASTRODUODENOSCOPY (EGD) WITH PROPOFOL;  Surgeon: Mauri Pole, MD;  [facility];  Service: Endoscopy;  Laterality: N/A;  ? EXTUBATION (ENDOTRAC

## 2022-03-19 NOTE — Assessment & Plan Note (Signed)
She is euvolemic and doing well.  Furosemide was discontinued and volume status is stable.  Continue metoprolol. ?

## 2022-03-19 NOTE — Assessment & Plan Note (Addendum)
Doing well on metoprolol.  Volume and hemodynamics stable.  She was unable to complete genetic testing 2/2 cost.  Family has been screened by echo.  No amyloid on PYP scan.  ?

## 2022-03-19 NOTE — Assessment & Plan Note (Addendum)
She remains in atrial fibrillation.  Rates are controlled.  Continue Eliquis 2.5 mg bid.  Her dose is reduced due to recurrent GI bleeding from AVMs.  We attempted to get a Watchman device but she developed complications with TEE.  Continue current management.  Continue metoprolol.   ?

## 2022-03-19 NOTE — Assessment & Plan Note (Signed)
She is due for fasting lipids/CMP.  Continue rosuvastatin.  LDL goal <70. ?

## 2022-03-27 ENCOUNTER — Other Ambulatory Visit (HOSPITAL_COMMUNITY): Payer: Self-pay | Admitting: *Deleted

## 2022-03-28 ENCOUNTER — Encounter (HOSPITAL_COMMUNITY)
Admission: RE | Admit: 2022-03-28 | Discharge: 2022-03-28 | Disposition: A | Discharge status: Home / Self Care | Payer: Medicare (Managed Care) | Source: Ambulatory Visit | Attending: Family Medicine | Admitting: Family Medicine

## 2022-03-28 DIAGNOSIS — N189 Chronic kidney disease, unspecified: Secondary | ICD-10-CM | POA: Insufficient documentation

## 2022-03-28 DIAGNOSIS — D509 Iron deficiency anemia, unspecified: Secondary | ICD-10-CM | POA: Insufficient documentation

## 2022-03-28 DIAGNOSIS — D631 Anemia in chronic kidney disease: Secondary | ICD-10-CM | POA: Insufficient documentation

## 2022-03-28 MED ORDER — SODIUM CHLORIDE 0.9 % IV SOLN
510.0000 mg | INTRAVENOUS | Status: DC
  Administered 2022-03-28: 510 mg via INTRAVENOUS
  Filled 2022-03-28: qty 510

## 2022-04-04 ENCOUNTER — Ambulatory Visit (HOSPITAL_COMMUNITY): Payer: Medicare (Managed Care)

## 2022-04-11 ENCOUNTER — Encounter (HOSPITAL_COMMUNITY)
Admission: RE | Admit: 2022-04-11 | Discharge: 2022-04-11 | Disposition: A | Discharge status: Home / Self Care | Payer: Medicare (Managed Care) | Source: Ambulatory Visit | Attending: Family Medicine | Admitting: Family Medicine

## 2022-04-11 DIAGNOSIS — D509 Iron deficiency anemia, unspecified: Secondary | ICD-10-CM | POA: Diagnosis not present

## 2022-04-11 MED ORDER — SODIUM CHLORIDE 0.9 % IV SOLN
510.0000 mg | INTRAVENOUS | Status: DC
  Administered 2022-04-11: 510 mg via INTRAVENOUS
  Filled 2022-04-11: qty 510

## 2022-04-13 NOTE — Progress Notes (Signed)
? ?Cardiology Office Note ?Date:  04/13/2022  ?Patient ID:  [name], [name] 1946/08/10, MRN [id number] ?PCP:  [facility] [place]  ?Cardiologist:  [name] ?Electrophysiologist: Dr. Rayann Heman ? ?  ?Chief Complaint: 1 year ? ?History of Present Illness: ?[name] is a 76 y.o. 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 Redmond Baseman, COPD, p.HTN with recurrent COPD exacerbations, pneumonias, HCM ? ?The patient was being evaluated for Watchman l2017, had difficulties with screening TEE ultimately with respiratory failure requiring intubation, stunned LV that recovered, found with tracheal stenosis.   ? ?She comes in today to be seen for Dr. Rayann Heman, last seen by him Dec 2021, doing well at that time, was doing OK on Eliquis though mentioned need for intermittent transfusions/iron.  Not a Watchman candidate. ?Pacer not remote capable and planned for a 15movisit. ? ?She saw Dr. ROval Linsey4/5/23, feeling quite poorly, tired, significant DOE with exertion, CP as well.  Daughter mentioned low blood counts recently and lasix and allopurinol stopped by her PMD, no new swelling ?She was on low dose Eliquis given GIBs/AVMs Discussed HCM with neg PYP scan for amyloid, did see genetics though declined testing 2/2 cost, family had screening with echos ?  Felt to be euvolemic, no changes were made ? ?TODAY ?She is accompanied by her daughter ?Generally not much energy, otherwise denies any concerns.   ?She denies any CP, palpitations or cardiac awareness ?No overt DOE, no SOB ?No near syncope or syncope. ?No bleeding or signs of bleeding ? ? ?Device information: ?Abbott dual chamber PPM, programmed VVIR, implanted 07/08/1985, last gen change 04/08/11 ?Device dependent  ? ? ?Past Medical History:   ?Diagnosis Date  ? Anemia   ? Angioedema   ? 2/2 ACE  ? Arteriovenous malformation of stomach   ? Arthritis   ? Asthma   ? AVM (arteriovenous malformation) of colon   ? small intestine; stomach  ? Blood transfusion   ? CHF (congestive heart failure) ([facility]   ? Complete heart block (HCC)   ? s/p PPM 1998  ? Complication of anesthesia   ? Difficult airway; anaphylaxis and swelling with propofol  ? DDD (degenerative disc disease)   ? Depression   ? Diastolic heart failure   ? Fatty liver 07/26/2010  ? GERD (gastroesophageal reflux disease)   ? GI bleed   ? History of alcohol abuse Stopped Fall 2012  ? History of tobacco use Quit Fall 2012  ? Hx of cardiovascular stress test   ? a. Lexiscan Myoview (10/15):  Small inferolateral and apical defect c/w scar and poss soft tissue attenuation, no ischemia, EF 42%  ? Hx of colonic polyp 08/13/2010  ? adenomatous  ? Hx of colonoscopy   ? Hyperlipidemia   ? Hypertension   ? Hypertrophic cardiomyopathy ([facility]   ? dx by Dr BOlevia Perches2009  ? Iron deficiency anemia   ? My[facility] ([facility]   ? Pacemaker   ? Panic attacks   ? Permanent atrial fibrillation ([place]   ? Renal failure   ? baseline creatinine 1.6  ? Right arm pain 01/08/2012  ? RLS (restless legs syndrome)   ? Dx 06/2007  ? Sleep apnea   ? no cpap   ? ? ?Past Surgical History:  ?Procedure Laterality Date  ? BIOPSY  09/04/2020  ?  Procedure: BIOPSY;  Surgeon: Mauri Pole, MD;  [facility];  Service: Endoscopy;;  ? CARDIAC CATHETERIZATION    ? CARDIAC CATHETERIZATION N/A 08/19/2016  ? Procedure: Right/Left Heart Cath and Coronary Angiography;  Surgeon: Jolaine Artist, MD;  Location: [facility];  Service: Cardiovascular;  Laterality: N/A;  ? COLONOSCOPY WITH PROPOFOL N/A 06/16/2017  ? Procedure: COLONOSCOPY WITH PROPOFOL;  Surgeon: Mauri Pole, MD;  [facility];  Service: Endoscopy;  Laterality: N/A;  ? COLONOSCOPY WITH PROPOFOL N/A 09/04/2020  ? Procedure: COLONOSCOPY WITH  PROPOFOL;  Surgeon: Mauri Pole, MD;  [facility];  Service: Endoscopy;  Laterality: N/A;  ? DIRECT LARYNGOSCOPY N/A 09/18/2016  ? Procedure: DIRECT LARYNGOSCOPY, BRONCHOSCOPY, REMOVAL OF INTUBATION GRANULOMA;  Surgeon: [name], MD;  Location: [place];  Service: ENT;  Laterality: N/A;  ? DIRECT LARYNGOSCOPY N/A 10/20/2016  ? Procedure: EXTUBATION AND FLEXIBLE LARYNGOSCOPE;  Surgeon: [name], MD;  Location: [place];  Service: ENT;  Laterality: N/A;  ? DIRECT LARYNGOSCOPY N/A 10/29/2016  ? Procedure: DIRECT LARYNGOSCOPY;  Surgeon: [name], MD;  Location: [place];  Service: ENT;  Laterality: N/A;  ? ENTEROSCOPY N/A 09/30/2021  ? Procedure: ENTEROSCOPY;  Surgeon: Yetta Flock, MD;  [facility];  Service: Gastroenterology;  Laterality: N/A;  ? ESOPHAGOGASTRODUODENOSCOPY  12/23/2011  ? Procedure: ESOPHAGOGASTRODUODENOSCOPY (EGD);  Surgeon: [name], MD;  Location[facility];  Service: Endoscopy;  Laterality: N/A;  ? ESOPHAGOGASTRODUODENOSCOPY (EGD) WITH PROPOFOL N/A 06/16/2017  ? Procedure: ESOPHAGOGASTRODUODENOSCOPY (EGD) WITH PROPOFOL;  Surgeon: Mauri Pole, MD;  [facility];  Service: Endoscopy;  Laterality: N/A;  ? ESOPHAGOGASTRODUODENOSCOPY (EGD) WITH PROPOFOL N/A 09/04/2020  ? Procedure: ESOPHAGOGASTRODUODENOSCOPY (EGD) WITH PROPOFOL;  Surgeon: Mauri Pole, MD;  [facility];  Service: Endoscopy;  Laterality: N/A;  ? EXTUBATION (ENDOTRACHEAL) IN OR N/A 07/21/2016  ? Procedure: EXTUBATION (ENDOTRACHEAL) IN OR;  Surgeon: [name], MD;  Location: [place];  Service: ENT;  Laterality: N/A;  ? GIVENS CAPSULE STUDY  12/23/2011  ? Procedure: GIVENS CAPSULE STUDY;  Surgeon: [name], MD;  [facility];  Service: Endoscopy;  Laterality: N/A;  ? HOT HEMOSTASIS N/A 09/04/2020  ? Procedure: HOT HEMOSTASIS (ARGON PLASMA COAGULATION/BICAP);  Surgeon: Mauri Pole, MD;  [facility];  Service: Endoscopy;   Laterality: N/A;  ? HOT HEMOSTASIS N/A 09/30/2021  ? Procedure: HOT HEMOSTASIS (ARGON PLASMA COAGULATION/BICAP);  Surgeon: Yetta Flock, MD;  [facility];  Service: Gastroenterology;  Laterality: N/A;  ? MICROLARYNGOSCOPY WITH LASER N/A 10/16/2016  ? Procedure: MICRODIRECTLARYNGOSCOPY WITH LASER ABLATION AND KENLOG INJECTION;  Surgeon: [name], MD;  Location: [place];  Service: ENT;  Laterality: N/A;  ? [place]  ? [facility], most recent gen change by [facility] 4/12  ? POLYPECTOMY  09/04/2020  ? Procedure: POLYPECTOMY;  Surgeon: Mauri Pole, MD;  [facility];  Service: Endoscopy;;  ? SUBMUCOSAL TATTOO INJECTION  09/30/2021  ? Procedure: SUBMUCOSAL TATTOO INJECTION;  Surgeon: Yetta Flock, MD;  Location[facility];  Service: Gastroenterology;;  ? TRACHEOSTOMY TUBE PLACEMENT N/A 10/29/2016  ? Procedure: TRACHEOSTOMY;  Surgeon: [name], MD;  Location: [place];  Service: ENT;  Laterality: N/A;  ? TUBAL LIGATION  04/01/2000  ? ? ?Current Outpatient Medications  ?Medication Sig Dispense Refill  ? acetaminophen (TYLENOL) 650 MG CR tablet Take 650 mg by mouth every 8 (eight) hours as needed for pain.     ? albuterol (PROVENTIL HFA;VENTOLIN  HFA) 108 (90 Base) MCG/ACT inhaler Inhale 1-2 puffs into the lungs every 6 (six) hours as needed for wheezing or shortness of breath. 1 Inhaler 6  ? ALPRAZolam (XANAX) 0.5 MG tablet Take 1 tablet (0.5 mg total) by mouth at bedtime as needed for anxiety. 30 tablet 0  ? apixaban (ELIQUIS) 2.5 MG TABS tablet Take 1 tablet (2.5 mg total) by mouth 2 (two) times daily. 60 tablet 1  ? bimatoprost (LUMIGAN) 0.01 % SOLN Place 1 drop into both eyes at bedtime.    ? brimonidine (ALPHAGAN) 0.2 % ophthalmic solution Place 1 drop into the right eye in the morning and at bedtime.    ? cetirizine (ZYRTEC) 10 MG tablet Take 10 mg by mouth daily as needed for allergies.     ? donepezil (ARICEPT ODT) 10 MG disintegrating tablet Take 10 mg by  mouth at bedtime.    ? Epoetin Alfa-epbx (RETACRIT IJ) Inject 1 Dose as directed once a week.     ? ferrous sulfate 325 (65 FE) MG tablet TAKE 1 TABLET BY MOUTH TWICE DAILY WITH MEALS TO KEEP BLOOD COUNT UP 60 tablet 11  ? i

## 2022-04-16 ENCOUNTER — Ambulatory Visit (INDEPENDENT_AMBULATORY_CARE_PROVIDER_SITE_OTHER): Payer: Medicare (Managed Care) | Admitting: Physician Assistant

## 2022-04-16 ENCOUNTER — Encounter: Payer: Self-pay | Admitting: Physician Assistant

## 2022-04-16 VITALS — BP 95/60 | HR 70 | Ht 63.0 in | Wt 120.0 lb

## 2022-04-16 DIAGNOSIS — I4821 Permanent atrial fibrillation: Secondary | ICD-10-CM

## 2022-04-16 DIAGNOSIS — Z950 Presence of cardiac pacemaker: Secondary | ICD-10-CM | POA: Diagnosis not present

## 2022-04-16 DIAGNOSIS — I422 Other hypertrophic cardiomyopathy: Secondary | ICD-10-CM

## 2022-04-16 LAB — CUP PACEART INCLINIC DEVICE CHECK
Battery Impedance: 2800 Ohm
Battery Remaining Longevity: 42 mo
Battery Voltage: 2.75 V
Date Time Interrogation Session: 20230503175556
Implantable Lead Implant Date: 19860725
Implantable Lead Implant Date: 19860725
Implantable Lead Location: 753859
Implantable Lead Location: 753860
Implantable Lead Serial Number: 652511
Implantable Pulse Generator Implant Date: 20120424
Lead Channel Impedance Value: 307 Ohm
Lead Channel Pacing Threshold Amplitude: 1 V
Lead Channel Pacing Threshold Pulse Width: 0.5 ms
Lead Channel Setting Pacing Amplitude: 2.5 V
Lead Channel Setting Pacing Pulse Width: 0.5 ms
Lead Channel Setting Sensing Sensitivity: 8 mV
Pulse Gen Serial Number: 2406848

## 2022-04-16 MED ORDER — METOPROLOL TARTRATE 25 MG PO TABS: 12.5000 mg | ORAL_TABLET | Freq: Two times a day (BID) | ORAL | 2 refills | Status: DC

## 2022-04-16 NOTE — Patient Instructions (Addendum)
Medication Instructions:  ? ? ?START TAKING METOPROLOL 12.5 MG TWICE A DAY  ?. ?*If you need a refill on your cardiac medications before your next appointment, please call your pharmacy* ? ? ?Lab Work: NONE ORDERED  TODAY ? ? ?If you have labs (blood work) drawn today and your tests are completely normal, you will receive your results only by: ?MyChart Message (if you have MyChart) OR ?A paper copy in the mail ?If you have any lab test that is abnormal or we need to change your treatment, we will call you to review the results. ? ? ?Testing/Procedures: NONE ORDERED  TODAY ? ? ? ?Follow-Up: ?At [facility], you and your health needs are our priority.  As part of our continuing mission to provide you with exceptional heart care, we have created designated Provider Care Teams.  These Care Teams include your primary Cardiologist (physician) and Advanced Practice Providers (APPs -  Physician Assistants and Nurse Practitioners) who all work together to provide you with the care you need, when you need it. ? ?We recommend signing up for the patient portal called "MyChart".  Sign up information is provided on this After Visit Summary.  MyChart is used to connect with patients for Virtual Visits (Telemedicine).  Patients are able to view lab/test results, encounter notes, upcoming appointments, etc.  Non-urgent messages can be sent to your provider as well.   ?To learn more about what you can do with MyChart, go to [email or web address].   ? ?Your next appointment:   ?6 month(s)V WITH DEVICE CLINIC  ? ?AND 1 YEAR  ? ?The format for your next appointment:   ?In Person ? ?Provider:   ?You may see Thompson Grayer, MD or one of the following Advanced Practice Providers on your designated Care Team:   ?Tommye Standard, PA-C ? ? ?Other Instructions ? ? ?Important Information About Sugar ? ? ? ? ?  ?

## 2022-06-02 ENCOUNTER — Telehealth: Payer: Self-pay

## 2022-06-02 NOTE — Telephone Encounter (Signed)
(  2:55 pm) PC SW left a message for patient and her daughter-[name] requesting a call back to assess patient's status and provide support.

## 2022-07-02 ENCOUNTER — Other Ambulatory Visit: Payer: Self-pay | Admitting: *Deleted

## 2022-07-02 DIAGNOSIS — N183 Chronic kidney disease, stage 3 unspecified: Secondary | ICD-10-CM

## 2022-07-11 ENCOUNTER — Encounter: Payer: Self-pay | Admitting: Gastroenterology

## 2022-07-17 ENCOUNTER — Ambulatory Visit (HOSPITAL_COMMUNITY)
Admission: RE | Admit: 2022-07-17 | Discharge: 2022-07-17 | Disposition: A | Discharge status: Home / Self Care | Payer: Medicare (Managed Care) | Source: Ambulatory Visit | Attending: Vascular Surgery | Admitting: Vascular Surgery

## 2022-07-17 ENCOUNTER — Encounter: Payer: Self-pay | Admitting: Vascular Surgery

## 2022-07-17 ENCOUNTER — Ambulatory Visit (INDEPENDENT_AMBULATORY_CARE_PROVIDER_SITE_OTHER): Payer: Medicare (Managed Care) | Admitting: Vascular Surgery

## 2022-07-17 ENCOUNTER — Ambulatory Visit (INDEPENDENT_AMBULATORY_CARE_PROVIDER_SITE_OTHER)
Admission: RE | Admit: 2022-07-17 | Discharge: 2022-07-17 | Disposition: A | Discharge status: Home / Self Care | Payer: Medicare (Managed Care) | Source: Ambulatory Visit | Attending: Vascular Surgery | Admitting: Vascular Surgery

## 2022-07-17 VITALS — BP 115/75 | HR 70 | Temp 97.7°F | Resp 20 | Ht 63.0 in | Wt 117.0 lb

## 2022-07-17 DIAGNOSIS — N184 Chronic kidney disease, stage 4 (severe): Secondary | ICD-10-CM

## 2022-07-17 DIAGNOSIS — N183 Chronic kidney disease, stage 3 unspecified: Secondary | ICD-10-CM

## 2022-07-17 DIAGNOSIS — I129 Hypertensive chronic kidney disease with stage 1 through stage 4 chronic kidney disease, or unspecified chronic kidney disease: Secondary | ICD-10-CM | POA: Diagnosis not present

## 2022-07-17 NOTE — Progress Notes (Signed)
ASSESSMENT & PLAN   STAGE IV CHRONIC KIDNEY DISEASE: This patient is referred for hemodialysis access.  We were asked to place an AV fistula.  If we could not place an AV fistula, we were asked not to place an AV graft.  Based on her vein map she does not appear to be a candidate for a fistula in either arm.  She has a pacemaker on the right side so when she does ultimately require a graft I would recommend placing the graft in the left arm.  She has multiphasic signals in the left wrist however she does have a high bifurcation of the brachial artery based on my assessment with the SonoSite.  She may potentially require an upper arm loop graft.  I have explained the reason for waiting on an AV graft since she is not a candidate for a fistula.  The graft can be used in 1 month where as a fistula can take 3 months and sometimes much longer to be ready for access.  If she does ultimately require a graft we would have to hold her Eliquis for 48 hours prior to the procedure.  REASON FOR CONSULT:    To evaluate for hemodialysis access.  The consult is requested by Dr. Carolin [name].  HPI:   [name] is a 76 y.o. female who is referred for evaluation of hemodialysis access.  She is right-handed.  I have reviewed the notes from the referring office.  We were asked to place an AV fistula if possible.  If we could not place an AV fistula we were asked to wait before placing an AV graft.  She has stage IV chronic kidney disease.  This is presumably secondary to hypertension.  Of note she has a history of a laryngeal/tracheal stenosis and is status post a tracheostomy in 2017.  She has multiple other medical issues including cardiomyopathy, atrial fibrillation, polymyalgia rheumatica, secondary hyper parathyroidism, congestive heart failure, and some mild underlying dementia.  Her labs in June of this year showed a GFR of 16.  On my history, the patient denies any recent uremic symptoms.  She is right-handed.   She has a English as a second language teacher on the right side.  She is on Eliquis for A-fib.  Past Medical History:  Diagnosis Date   Anemia    Angioedema    2/2 ACE   Arteriovenous malformation of stomach    Arthritis    Asthma    AVM (arteriovenous malformation) of colon    small intestine; stomach   Blood transfusion    CHF (congestive heart failure) (HCC)    Complete heart block (HCC)    s/p PPM 0923   Complication of anesthesia    Difficult airway; anaphylaxis and swelling with propofol   DDD (degenerative disc disease)    Depression    Diastolic heart failure    Fatty liver 07/26/2010   GERD (gastroesophageal reflux disease)    GI bleed    History of alcohol abuse Stopped Fall 2012   History of tobacco use Quit Fall 2012   Hx of cardiovascular stress test    a. Lexiscan Myoview (10/15):  Small inferolateral and apical defect c/w scar and poss soft tissue attenuation, no ischemia, EF 42%   Hx of colonic polyp 08/13/2010   adenomatous   Hx of colonoscopy    Hyperlipidemia    Hypertension    Hypertrophic cardiomyopathy ([place])    dx by Dr [name] 2009   Iron deficiency anemia  Myocardial infarction [facility])    Pacemaker    Panic attacks    Permanent atrial fibrillation (HCC)    Renal failure    baseline creatinine 1.6   Right arm pain 01/08/2012   RLS (restless legs syndrome)    Dx 06/2007   Sleep apnea    no cpap     Family History  Problem Relation Age of Onset   Hypertension Mother    Stroke Mother    Heart disease Mother    Aneurysm Mother    CVA Mother    Hypertension Father    Stroke Father    Heart attack Father    Alcohol abuse Father    Hypertension Sister    Hypertension Brother    Colon cancer Brother    Cancer Brother    Diabetes Sister    Diabetes Brother    Heart attack Brother    Heart attack Brother    Heart disease Sister    Alcohol abuse Brother    Heart disease Daughter    Esophageal cancer Neg Hx    Stomach cancer Neg Hx    Rectal cancer  Neg Hx     SOCIAL HISTORY: Social History   Tobacco Use   Smoking status: Former    Packs/day: 0.10    Years: 50.00    Total pack years: 5.00    Types: Cigarettes    Quit date: 08/16/2011    Years since quitting: 10.9   Smokeless tobacco: Never   Tobacco comments:    quit 2012  Substance Use Topics   Alcohol use: No    Comment: quit 08/16/2011    Allergies  Allergen Reactions   Ace Inhibitors Swelling    angiodema   Other Anaphylaxis and Swelling    Reaction to unspecified anesthesia    Propofol     Dysrhythmia likely to drop in afterload post induction with Propofol. Did not have anaphylactic symptoms with MAC in 2018    Current Outpatient Medications  Medication Sig Dispense Refill   acetaminophen (TYLENOL) 650 MG CR tablet Take 650 mg by mouth every 8 (eight) hours as needed for pain.      albuterol (PROVENTIL HFA;VENTOLIN HFA) 108 (90 Base) MCG/ACT inhaler Inhale 1-2 puffs into the lungs every 6 (six) hours as needed for wheezing or shortness of breath. 1 Inhaler 6   ALPRAZolam (XANAX) 0.5 MG tablet Take 1 tablet (0.5 mg total) by mouth at bedtime as needed for anxiety. 30 tablet 0   apixaban (ELIQUIS) 2.5 MG TABS tablet Take 1 tablet (2.5 mg total) by mouth 2 (two) times daily. 60 tablet 1   bimatoprost (LUMIGAN) 0.01 % SOLN Place 1 drop into both eyes at bedtime.     brimonidine (ALPHAGAN) 0.2 % ophthalmic solution Place 1 drop into the right eye in the morning and at bedtime.     cetirizine (ZYRTEC) 10 MG tablet Take 10 mg by mouth daily as needed for allergies.      donepezil (ARICEPT ODT) 10 MG disintegrating tablet Take 10 mg by mouth at bedtime.     Epoetin Alfa-epbx (RETACRIT IJ) Inject 1 Dose as directed once a week.      ferrous sulfate 325 (65 FE) MG tablet TAKE 1 TABLET BY MOUTH TWICE DAILY WITH MEALS TO KEEP BLOOD COUNT UP 60 tablet 11   ipratropium-albuterol (DUONEB) 0.5-2.5 (3) MG/3ML SOLN USE 3 ML VIA NEBULIZER EVERY 6 HOURS (Patient taking differently:  Inhale 3 mLs into the lungs every 6 (six) hours  as needed (wheezing/shortness of breath).) 360 mL 0   metoprolol tartrate (LOPRESSOR) 25 MG tablet Take 0.5 tablets (12.5 mg total) by mouth 2 (two) times daily. 90 tablet 2   mirtazapine (REMERON) 7.5 MG tablet Take 7.5 mg by mouth at bedtime.     nitroGLYCERIN (NITROSTAT) 0.4 MG SL tablet Place 1 tablet (0.4 mg total) under the tongue every 5 (five) minutes as needed for chest pain. 25 tablet 1   Olopatadine HCl 0.2 % SOLN Place 1-2 drops into both eyes daily as needed (itching).  1   oxymetazoline (AFRIN NASAL SPRAY) 0.05 % nasal spray Place 1 spray into both nostrils 2 (two) times daily. (Patient taking differently: Place 1 spray into both nostrils 2 (two) times daily as needed for congestion.) 30 mL 0   rosuvastatin (CRESTOR) 40 MG tablet Take 1 tablet (40 mg total) by mouth at bedtime. 90 tablet 3   sodium chloride (OCEAN) 0.65 % SOLN nasal spray Place 1 spray into both nostrils daily as needed for congestion.      STIMULANT LAXATIVE 8.6-50 MG tablet Take 2 tablets by mouth at bedtime as needed for mild constipation.      No current facility-administered medications for this visit.    REVIEW OF SYSTEMS:  _0  denotes positive finding, _1  denotes negative finding Cardiac  Comments:  Chest pain or chest pressure:    Shortness of breath upon exertion: x   Short of breath when lying flat:    Irregular heart rhythm:        Vascular    Pain in calf, thigh, or hip brought on by ambulation:    Pain in feet at night that wakes you up from your sleep:     Blood clot in your veins:    Leg swelling:  x       Pulmonary    Oxygen at home:    Productive cough:     Wheezing:         Neurologic    Sudden weakness in arms or legs:     Sudden numbness in arms or legs:     Sudden onset of difficulty speaking or slurred speech:    Temporary loss of vision in one eye:     Problems with dizziness:         Gastrointestinal    Blood in stool:      Vomited blood:         Genitourinary    Burning when urinating:     Blood in urine:        Psychiatric    Major depression:         Hematologic    Bleeding problems:    Problems with blood clotting too easily:        Skin    Rashes or ulcers:        Constitutional    Fever or chills:    -  PHYSICAL EXAM:   Vitals:   07/17/22 1503  BP: 115/75  Pulse: 70  Resp: 20  Temp: 97.7 F (36.5 C)  SpO2: 99%  Weight: 117 lb (53.1 kg)  Height: _2  (1.6 m)   Body mass index is 20.73 kg/m. GENERAL: The patient is a well-nourished female, in no acute distress. The vital signs are documented above. CARDIAC: There is a regular rate and rhythm.  VASCULAR: I do not detect carotid bruits. She has palpable radial pulses bilaterally. Of note I did look at her upper extremity arteries on  the right myself with the SonoSite and she appears to have a high bifurcation of the brachial artery on the left. PULMONARY: There is good air exchange bilaterally without wheezing or rales. ABDOMEN: Soft and non-tender with normal pitched bowel sounds.  MUSCULOSKELETAL: There are no major deformities. NEUROLOGIC: No focal weakness or paresthesias are detected. SKIN: There are no ulcers or rashes noted. PSYCHIATRIC: The patient has a normal affect.  DATA:    BILATERAL UPPER EXTREMITY VEIN MAP: I have independently interpreted her upper extremity vein map today.  On the right side the forearm and upper arm cephalic vein do not appear adequate in size.  The basilic vein does not appear adequate in size.  On the left side the forearm and upper arm cephalic vein do not appear adequate in size.  The basilic vein does not appear adequate in size.  UPPER EXTREMITY ARTERIAL DUPLEX: I have independently interpreted her upper extremity arterial duplex.  On the right side there is a triphasic radial signal and a biphasic ulnar signal with the Doppler.  The brachial artery measures 4 mm in diameter.  On  the left side there is a triphasic radial and ulnar signal.  The brachial artery measures 2.6 mm in diameter.  Deitra [facility]

## 2022-07-31 ENCOUNTER — Ambulatory Visit (HOSPITAL_COMMUNITY)
Admission: RE | Admit: 2022-07-31 | Discharge: 2022-07-31 | Disposition: A | Discharge status: Home / Self Care | Payer: Medicare (Managed Care) | Source: Ambulatory Visit

## 2022-07-31 VITALS — BP 94/52 | HR 69 | Temp 97.3°F | Resp 18

## 2022-07-31 DIAGNOSIS — D649 Anemia, unspecified: Secondary | ICD-10-CM | POA: Insufficient documentation

## 2022-07-31 LAB — PREPARE RBC (CROSSMATCH)

## 2022-08-01 LAB — BPAM RBC
Blood Product Expiration Date: 202309142359
ISSUE DATE / TIME: 202308170936
Unit Type and Rh: 5100

## 2022-08-01 LAB — TYPE AND SCREEN
ABO/RH(D): O POS
Antibody Screen: NEGATIVE
Unit division: 0

## 2022-08-11 ENCOUNTER — Encounter (HOSPITAL_COMMUNITY): Payer: Self-pay | Admitting: Cardiology

## 2022-08-11 ENCOUNTER — Ambulatory Visit (HOSPITAL_COMMUNITY)
Admission: RE | Admit: 2022-08-11 | Discharge: 2022-08-11 | Disposition: A | Discharge status: Home / Self Care | Payer: Medicare (Managed Care) | Source: Ambulatory Visit | Attending: Cardiology | Admitting: Cardiology

## 2022-08-11 VITALS — BP 94/58 | HR 70 | Wt 114.0 lb

## 2022-08-11 DIAGNOSIS — E785 Hyperlipidemia, unspecified: Secondary | ICD-10-CM | POA: Insufficient documentation

## 2022-08-11 DIAGNOSIS — I5022 Chronic systolic (congestive) heart failure: Secondary | ICD-10-CM | POA: Insufficient documentation

## 2022-08-11 DIAGNOSIS — Z930 Tracheostomy status: Secondary | ICD-10-CM | POA: Diagnosis not present

## 2022-08-11 DIAGNOSIS — N184 Chronic kidney disease, stage 4 (severe): Secondary | ICD-10-CM | POA: Diagnosis not present

## 2022-08-11 DIAGNOSIS — Q2739 Arteriovenous malformation, other site: Secondary | ICD-10-CM | POA: Insufficient documentation

## 2022-08-11 DIAGNOSIS — Z8673 Personal history of transient ischemic attack (TIA), and cerebral infarction without residual deficits: Secondary | ICD-10-CM | POA: Insufficient documentation

## 2022-08-11 DIAGNOSIS — I422 Other hypertrophic cardiomyopathy: Secondary | ICD-10-CM

## 2022-08-11 DIAGNOSIS — Z79899 Other long term (current) drug therapy: Secondary | ICD-10-CM | POA: Insufficient documentation

## 2022-08-11 DIAGNOSIS — Z7901 Long term (current) use of anticoagulants: Secondary | ICD-10-CM | POA: Diagnosis not present

## 2022-08-11 DIAGNOSIS — I4821 Permanent atrial fibrillation: Secondary | ICD-10-CM | POA: Diagnosis not present

## 2022-08-11 LAB — BRAIN NATRIURETIC PEPTIDE: B Natriuretic Peptide: 272.6 pg/mL — ABNORMAL HIGH (ref 0.0–100.0)

## 2022-08-11 LAB — BASIC METABOLIC PANEL
Anion gap: 7 (ref 5–15)
BUN: 36 mg/dL — ABNORMAL HIGH (ref 8–23)
CO2: 22 mmol/L (ref 22–32)
Calcium: 9.3 mg/dL (ref 8.9–10.3)
Chloride: 111 mmol/L (ref 98–111)
Creatinine, Ser: 3.77 mg/dL — ABNORMAL HIGH (ref 0.44–1.00)
GFR, Estimated: 12 mL/min — ABNORMAL LOW (ref >60)
Glucose, Bld: 119 mg/dL — ABNORMAL HIGH (ref 70–99)
Potassium: 4.5 mmol/L (ref 3.5–5.1)
Sodium: 140 mmol/L (ref 135–145)

## 2022-08-11 MED ORDER — METOPROLOL SUCCINATE ER 25 MG PO TB24: 12.5000 mg | ORAL_TABLET | Freq: Every day | ORAL | 1 refills | Status: DC

## 2022-08-11 NOTE — Progress Notes (Signed)
Called [facility] of the Triage [phone]), transferred to VM of Nigel Bridgeman, left detailed mess regarding changing Metoprolol tartrate to Metoprolol succinate 12.5 mg Daily, advised new rx sent to [facility], call back for further questions

## 2022-08-11 NOTE — Patient Instructions (Signed)
STOP Metoprolol Tartrate  START Metoprolol Succinate 12.5 mg (1/2 tab) Daily, **prescription has been send to [facility] and message has been sent to [facility] regarding this change  Labs done today, your results will be available in MyChart, we will contact you for abnormal readings.  Genetic test has been done, this has to be sent to [place] to be processed and can take 1-2 weeks to get results back.  We will let you know the results.  Your physician recommends that you schedule a follow-up appointment in: 2 months  If you have any questions or concerns before your next appointment please send [place] a message through [place] or call our office at [phone].    TO LEAVE A MESSAGE FOR THE NURSE SELECT OPTION 2, PLEASE LEAVE A MESSAGE INCLUDING: YOUR NAME DATE OF BIRTH CALL BACK NUMBER REASON FOR CALL**this is important as we prioritize the call backs  YOU WILL RECEIVE A CALL BACK THE SAME DAY AS LONG AS YOU CALL BEFORE 4:00 PM  At the [facility], you and your health needs are our priority. As part of our continuing mission to provide you with exceptional heart care, we have created designated Provider Care Teams. These Care Teams include your primary Cardiologist (physician) and Advanced Practice Providers (APPs- Physician Assistants and Nurse Practitioners) who all work together to provide you with the care you need, when you need it.   You may see any of the following providers on your designated Care Team at your next follow up: Dr Glori Bickers Dr Loralie Champagne Dr. Roxana Hires, NP Lyda Jester, [place] [facility] [place], [place] Forestine Na, NP Audry Riles, PharmD   Please be sure to bring in all your medications bottles to every appointment.

## 2022-08-11 NOTE — Progress Notes (Signed)
PCP: [facility]: [name] HF Cardiology: Dr. Aundra Dubin  76 y.o. with history of HF with mid range EF, permanent atrial fibrillation, CHB with PPM, CVA in 5/20, and tracheal stenosis s/p tracheostomy presents for evaluation of CHF. Patient developed CHB in 1986 and had a St Jude pacemaker placed at that time.  She is pacemaker dependent.  She has CKD stage IV with last creatinine 2.19 from 11/22.  She has permanent atrial fibrillation and is on apixaban.  She has had GI bleeding from AVMs in the colon, small bowel, and stomach.  She has been deemed a poor Watchman candidate due to comorbidities.  Echo in 10/22 showed speckled myocardium, severe concentric LVH, EF 40-45%, mildly decreased RV systolic function with normal RV size, PASP 52 mmHg, mild-moderate MR.  She has had multiple family siblings with pacemakers as well as family members with CHF.  2 nieces had sudden cardiac death. Based on echo with LVH and family history, there was concern for a familial cardiomyopathy of some sort, ?hereditary ATTR cardiac amyloidosis versus nonobstructive hypertrophic cardiomyopathy.  She saw Dr. Broadus John for genetic counseling and [employer] gene testing was recommended.  PYP scan was not suggestive of ATTR cardiac amyloidosis.   Patient lives with her daughter.  She does her own ADLs.  She is short of breath and tired after walking 20-30 feet.  No chest pain.  No orthopnea/PND.  Occasional lightheadedness if she stands too fast but no falls.  She has not had BP room for GDMT titration.  She uses a cane for stability.   ECG (personally reviewed): Atrial fibrillation with V-pacing  Labs (11/22): K 3.8, creatinine 2.19  PMH: 1. Complete heart block: St Jude PPM placed 1986.  2. GI bleeding: Colonic, small bowel, gastric AVMs.  - Not candidate for Watchman due to comorbidities.  3. Fe deficiency anemia.  4. Hyperlipidemia 5. CKD stage 4 6. Atrial fibrillation:  Permanent 7. Tracheal stenosis: Has tracheostomy. 8. COPD 9. Angioedema with ACEI 10. LHC (9/17) with normal coronaries.  11. CVA (5/20): Expressive aphasia.  12. Chronic HF with mid range EF: Echo in 10/22 with speckled myocardium, severe concentric LVH, EF 40-45%, mildly decreased RV systolic function with normal RV size, PASP 52 mmHg, mild-moderate MR.  - PYP scan (12/22): grade 0, H/CL < 1.5.  Not suggestive of ATTR cardiac amyloidosis.  - Possible hypertrophic cardiomyopathy.   SH: Lives with daughter.  Nonsmoker, no ETOH.   FH: 2 nieces with suspected sudden death. Multiple siblings with pacemakers.   ROS: All systems reviewed and negative except as per HPI.   Current Outpatient Medications  Medication Sig Dispense Refill   acetaminophen (TYLENOL) 650 MG CR tablet Take 650 mg by mouth every 8 (eight) hours as needed for pain.      albuterol (PROVENTIL HFA;VENTOLIN HFA) 108 (90 Base) MCG/ACT inhaler Inhale 1-2 puffs into the lungs every 6 (six) hours as needed for wheezing or shortness of breath. 1 Inhaler 6   ALPRAZolam (XANAX) 0.5 MG tablet Take 1 tablet (0.5 mg total) by mouth at bedtime as needed for anxiety. 30 tablet 0   apixaban (ELIQUIS) 2.5 MG TABS tablet Take 1 tablet (2.5 mg total) by mouth 2 (two) times daily. 60 tablet 1   bimatoprost (LUMIGAN) 0.01 % SOLN Place 1 drop into both eyes at bedtime.     brimonidine (ALPHAGAN) 0.2 % ophthalmic solution Place 1 drop into the right eye in the morning and at bedtime.  cetirizine (ZYRTEC) 10 MG tablet Take 10 mg by mouth daily as needed for allergies.      donepezil (ARICEPT ODT) 10 MG disintegrating tablet Take 10 mg by mouth at bedtime.     Epoetin Alfa-epbx (RETACRIT IJ) Inject 1 Dose as directed once a week.      ferrous sulfate 325 (65 FE) MG tablet TAKE 1 TABLET BY MOUTH TWICE DAILY WITH MEALS TO KEEP BLOOD COUNT UP 60 tablet 11   metoprolol succinate (TOPROL XL) 25 MG 24 hr tablet Take 0.5 tablets (12.5 mg total) by  mouth daily. 45 tablet 1   mirtazapine (REMERON) 7.5 MG tablet Take 7.5 mg by mouth at bedtime.     nitroGLYCERIN (NITROSTAT) 0.4 MG SL tablet Place 1 tablet (0.4 mg total) under the tongue every 5 (five) minutes as needed for chest pain. 25 tablet 1   Olopatadine HCl 0.2 % SOLN Place 1-2 drops into both eyes daily as needed (itching).  1   Oxymetazoline HCl (AFRIN NASAL SPRAY NA) Place 1 spray into the nose as needed.     rosuvastatin (CRESTOR) 40 MG tablet Take 1 tablet (40 mg total) by mouth at bedtime. 90 tablet 3   sodium chloride (OCEAN) 0.65 % SOLN nasal spray Place 1 spray into both nostrils daily as needed for congestion.      STIMULANT LAXATIVE 8.6-50 MG tablet Take 2 tablets by mouth at bedtime as needed for mild constipation.      ipratropium-albuterol (DUONEB) 0.5-2.5 (3) MG/3ML SOLN USE 3 ML VIA NEBULIZER EVERY 6 HOURS 360 mL 0   No current facility-administered medications for this encounter.   BP (!) 94/58   Pulse 70   Wt 51.7 kg (114 lb)   SpO2 98%   BMI 20.19 kg/m  General: NAD Neck: No JVD, no thyromegaly or thyroid nodule. Tracheostomy present.  Lungs: Clear to auscultation bilaterally with normal respiratory effort. CV: Nondisplaced PMI.  Heart regular S1/S2, no S3/S4, no murmur.  No peripheral edema.  No carotid bruit.  Normal pedal pulses.  Abdomen: Soft, nontender, no hepatosplenomegaly, no distention.  Skin: Intact without lesions or rashes.  Neurologic: Alert and oriented x 3.  Psych: Normal affect. Extremities: No clubbing or cyanosis.  HEENT: Normal.   Assessment/Plan: 1. Chronic HF with mid-range EF: Echo in 10/22 showed speckled myocardium, severe concentric LVH, EF 40-45%, mildly decreased RV systolic function with normal RV size, PASP 52 mmHg, mild-moderate MR.  PYP scan was not suggestive of ATTR cardiac amyloidosis. She had 2 nieces with SCD and several siblings with pacemakers and CHF. Nonobstructive hypertrophic cardiomyopathy is certainly possible  here and could explain the family history.  She is not volume overloaded on exam though she has NYHA class III symptoms. Hard to sort out contribution from CHF versus deconditioning.  GDMT titration limited by CKD stage 4 and soft BP.  - Would stop metoprolol tartrate and start Toprol XL 12.5 mg daily.  - BMET/BNP today, consider SGLT2 inhibitor if GFR is not too low.  - She cannot get a cardiac MRI due to pacemaker.  - We discussed [employer] testing for hereditary cardiomyopathies to see if she carries a HCM gene.  We discussed how this may not help her but could be used to screen her family if her testing came back positive. She is interested in having the testing done, I will arrange.  2. CKD stage IV: Check BMET today.  3. H/o tracheal stenosis: Has tracheostomy.  4. Complete heart block: St Jude  PPM appears to function normally.  5. Atrial fibrillation: Chronic.  She has history of CVA.  No overt GI bleeding.  - Continue apixaban 2.5 mg bid.  6. GI bleeding: From AVMs.  Has been deemed poor Watchman candidate.   Followup 2 months.   Loralie Champagne 08/11/2022

## 2022-08-12 ENCOUNTER — Telehealth (HOSPITAL_COMMUNITY): Payer: Self-pay | Admitting: Surgery

## 2022-08-12 NOTE — Telephone Encounter (Signed)
-----   Message from Larey Dresser, MD sent at 08/11/2022 11:28 PM EDT ----- Creatinine higher than last year.  Make sure she has a nephrologist.  If not, needs referral.

## 2022-08-12 NOTE — Telephone Encounter (Signed)
I contacted patient to review results.  I spoke to her daughter who confirms that she has a nephrologist and a scheduled appt with them in September.

## 2022-08-29 ENCOUNTER — Telehealth: Payer: Self-pay

## 2022-08-29 NOTE — Telephone Encounter (Signed)
Amanda from [facility] called to schedule pt's AVG. I have sent a message to MD and have let Estill Bamberg know. I have tried to reach out to pt as well but was unable to make contact. I have left Estill Bamberg a VM to let her know we will touch base once we hear back from MD.

## 2022-09-03 ENCOUNTER — Other Ambulatory Visit: Payer: Self-pay

## 2022-09-03 DIAGNOSIS — I129 Hypertensive chronic kidney disease with stage 1 through stage 4 chronic kidney disease, or unspecified chronic kidney disease: Secondary | ICD-10-CM

## 2022-09-05 ENCOUNTER — Encounter (HOSPITAL_COMMUNITY): Payer: Self-pay | Admitting: Vascular Surgery

## 2022-09-05 ENCOUNTER — Other Ambulatory Visit: Payer: Self-pay

## 2022-09-05 NOTE — Progress Notes (Addendum)
[facility]W CALL  Patient was given pre-op instructions over the phone. The opportunity was given for the patient to ask questions. No further questions asked. Patient verbalized understanding of instructions given.   PCP - Calla Kicks Cardiologist - [place] Aundra Dubin  PPM/ICD - PPM, St Jude/Abbott Device Orders - IB to device clinic Rep Notified - yes spoke with Caryl Bis from [facility]  Chest x-ray - 2021 1 view EKG - 08/11/22 Stress Test - 11/21/21 ECHO - 09/25/21 Cardiac Cath - 2017  Sleep Study - 2018 CPAP - n/a  Blood Thinner Instructions: LD 09/05/22 Aspirin Instructions: denies   Patients daughter said that she prefers not to give her any medications the morning of procedure.  I encouraged her to at least give the metoprolol  Anesthesia review: yes, patient has trach with anaphylaxis to propofol requiring trach insertion  Patient denies shortness of breath, fever, cough and chest pain over the phone call   All instructions explained to the patient, with a verbal understanding of the material. Patient agrees to go over the instructions while at home for a better understanding.

## 2022-09-08 ENCOUNTER — Encounter: Payer: Self-pay | Admitting: Cardiology

## 2022-09-08 ENCOUNTER — Non-Acute Institutional Stay (HOSPITAL_COMMUNITY)
Admission: RE | Admit: 2022-09-08 | Discharge: 2022-09-08 | Disposition: A | Discharge status: Home / Self Care | Payer: Medicare (Managed Care) | Source: Ambulatory Visit | Attending: Internal Medicine | Admitting: Internal Medicine

## 2022-09-08 DIAGNOSIS — D649 Anemia, unspecified: Secondary | ICD-10-CM | POA: Insufficient documentation

## 2022-09-08 MED ORDER — SODIUM CHLORIDE 0.9 % IV SOLN: INTRAVENOUS | Status: DC | PRN

## 2022-09-08 MED ORDER — SODIUM CHLORIDE 0.9 % IV SOLN
510.0000 mg | Freq: Once | INTRAVENOUS | Status: AC
  Administered 2022-09-08: 510 mg via INTRAVENOUS
  Filled 2022-09-08: qty 17

## 2022-09-08 NOTE — Progress Notes (Signed)
Anesthesia Chart Review: [name]  Case: [id number] Date/Time: 09/09/22 0945   Procedure: INSERTION OF LEFT UPPER ARM ARTERIOVENOUS (AV) GORE-TEX GRAFT (Left)   Anesthesia type: Choice   Pre-op diagnosis: CKD IV   Location: [facility] OR ROOM 11 / [place] OR   Surgeons: Angelia Mould, MD       DISCUSSION: Patient is a 76 year old female scheduled for the above procedure.  History includes former smoker (quit 08/16/11), CAD (MI; normal coronaries 08/19/16), afib (not felt to be a good candidate for [facility] device 2017), chronic diastolic CHF, cardiomyopathy (possible hypertrophic CM; PYP scan 11/2021 did not suggest ATTR cardiac amyloidosis), PPM (St. 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   FOR ANESTHESIA HISTORY: DIFFICULT AIRWAY and anaphylaxis/swelling with propofol is listed. As above, on 07/10/26 there was difficult passage of TEE probe. She developed significant hypotension and increasing respiratory distress requiring intubation and pressors. By notes, EP Dr. Rayann Heman felt hyptoension and acutely reduced EF may be a result of propofol. She was an anticipated difficult intubation on 07/10/26 due to large tongue and airway edema was after traumatic TEE  probe insertion (Laryngoscope 3 and McGraph used). On 07/15/16 also difficult due to cord edema and could not pass 7.0 ETT so 6.5 ETT passed over glidescope stylet by PCCM NP. She did not have anaphylactic symptoms with MAC on 06/16/17, 09/04/20, 09/30/21 GI procedure when propofol was used.   Last cardiology visit was with Dr. Aundra Dubin at the HF clinic on 08/11/22.  Note reviewed. She was not volume overloaded although on exam had NYHA class III symptoms.  He noted, "Hard to sort out contribution from CHF versus deconditioning.  GDMT titration limited by CKD stage 4 and soft BP."  He is arranging hereditary cardiomyopathy genetic testing as it may be helpful with knowing if her family needs to be screened in the future. 2 month follow-up planned. Per EP, she is pacer dependent.   EP PPM Perioperative recommendations: Device Information: Clinic EP Physician:  Allegra Lai, MD   Device Type:  Pacemaker Manufacturer and Phone #:  St. Jude/Abbott: [phone] Pacemaker Dependent?:  Yes.   Date of Last Device Check:  04/16/2022         Normal Device Function?:  Yes.     Electrophysiologist's Recommendations: Have magnet available. Provide continuous ECG monitoring when magnet is used or reprogramming is to be performed.  Procedure will likely interfere with device function.  Device should be programmed:  Asynchronous pacing during procedure and returned to normal programming after procedure    Reported last dose of Eliquis 09/05/2022.  Per 09/03/22 ENT visit by Dr. Redmond Baseman, she has a #4 flexible cuffless Shiley trach in place with a Passy-Muir valve. The trach tube was changed without difficulty. Her daughter inquired about decannulation, but because she was due for dialysis access surgery, he recommended delaying consideration for decannulation until her follow-up in 3 months.   Anesthesia team  to evaluate on the day of surgery.   VS:  BP Readings from Last 3 Encounters:  09/08/22 122/70  08/11/22 (!)  94/58  07/31/22 (!) 94/52   Pulse Readings from Last 3 Encounters:  09/08/22 68  08/11/22 70  07/31/22 69      PROVIDERS: [facility], [place] is PCP  Loralie Champagne, MD is HF cardiologist Skeet Latch, MD is primary cardiologist. Last visit 03/19/22.  Thompson Grayer, MD is EP cardiologist. Last visit 04/16/22 with Tommye Standard, PA-C. She noted patient is device dependent.  Melida Quitter, MD is ENT Lawson Radar, MD is nephrologist Heath Lark, MD is HEM-ONC Harl Bowie, MD is GI   LABS: For day of surgery. Last results in [facility] include: Lab Results  Component Value Date   GLUCOSE 119 (H) 08/11/2022   NA 140 08/11/2022   K 4.5 08/11/2022   CL 111 08/11/2022   CREATININE 3.77 (H) 08/11/2022   BUN 36 (H) 08/11/2022   CO2 22 08/11/2022     EKG: 08/11/22: Ventricular-paced rhythm Abnormal ECG When compared with ECG of 22-Apr-2020 20:07, No significant change was found Confirmed by Gwyndolyn Kaufman [phone]) on 08/11/2022 2:21:09 PM   CV: Myocardial Amyloid Imaging Planar & SPECT 11/20/21:   By semi-quantitative assessment scan is consistent with no increased heart uptake-Grade 0. Heart to contralateral lung ratio is between 1-1.5, indeterminate for amyloid.   Study is equivocal for TTR amyloidosis (visual score of 1/ratio between 1-1.5).   Prior study not available for comparison. Ratio of 1.2 indeterminate but Rib uptake is very much more than myocardial uptake And study not likely representative of amyloid     Echo 09/25/21: IMPRESSIONS   1. Speckled appearance of the myocardium, concerning for possible  infiltrative cardiomyopathy such as amyloid (although patient carries  diagnosis of HCM) - no evidence for increased LVOT gradient.. Left  ventricular ejection fraction, by estimation, is  40 to 45%. The left ventricle has mildly decreased function. The left  ventricle demonstrates global hypokinesis. There is severe left  ventricular  hypertrophy. Left ventricular diastolic parameters are  consistent with Grade II diastolic dysfunction  (pseudonormalization). Elevated left ventricular end-diastolic pressure.  The E/e' is 22.   2. Abnormal RV free wall strain at -7.9%. RV myocardium is speckled.  Right ventricular systolic function is mildly reduced. The right  ventricular size is normal. Mildly increased right ventricular wall  thickness. There is moderately elevated pulmonary  artery systolic pressure. The estimated right ventricular systolic  pressure is 42.3 mmHg.   3. Left atrial size was massively dilated.   4. The pericardial effusion is posterior to the left ventricle.   5. The mitral valve is abnormal. Mild to moderate mitral valve  regurgitation. Moderate mitral annular calcification.   6. Tricuspid valve regurgitation is mild to moderate.   7. The aortic valve is tricuspid. Aortic valve regurgitation is not  visualized. Mild aortic valve sclerosis is present, with no evidence of  aortic valve stenosis.   8. Aortic dilatation noted. There is mild dilatation of the ascending  aorta, measuring 38 mm.   9. The inferior vena cava is dilated in size with >50% respiratory  variability, suggesting right atrial pressure of 8 mmHg.  - Comparison(s): Changes from prior study are noted. 05/09/2019: LVEF 65%.  - Conclusion(s)/Recommendation(s): Findings concerning for infiltrative vs  hypertrophic cardiomyopathy, would recommend Cardiac MRI for  clarification. [Could not get cMRI due to pacemaker.]   RHC/LHC 08/19/16: Findings: Ao = 145/79 (105)  LV = 139/10/18 RA = 7 RV = 68/9 PA = 64/18 (38) PCW = 22 (v waves to 37) Fick cardiac output/index = 5.4/2.9 PVR = 3.0 WU Ao sat = 94% PA sat = 59%, 59% Assessment: 1. Normal coronary arteries 2. Mild to moderate pulmonary HTN with normal PVR (mostly left-sided)  3. Prominent v-waves in PCWP tracing suggestive of severe diastolic dysfunction or significant mitral  regurgitation  4. Normal LV function and cardiac output Plan/Discussion: Continue medical therapy. Optimize diuretic regimen. Review echo to assess degree of MR.      Past Medical History:  Diagnosis Date   Anemia    Angioedema    2/2 ACE   Arteriovenous malformation of stomach    Arthritis    Asthma    AVM (arteriovenous malformation) of colon    small intestine; stomach   Blood transfusion    CHF (congestive heart failure) (HCC)    Complete heart block (HCC)    s/p PPM 0623   Complication of anesthesia    Difficult airway; anaphylaxis and swelling with propofol   DDD (degenerative disc disease)    Depression    Diastolic heart failure    Fatty liver 07/26/2010   GERD (gastroesophageal reflux disease)    GI bleed    History of alcohol abuse Stopped Fall 2012   History of tobacco use Quit Fall 2012   Hx of cardiovascular stress test    a. Lexiscan Myoview (10/15):  Small inferolateral and apical defect c/w scar and poss soft tissue attenuation, no ischemia, EF 42%   Hx of colonic polyp 08/13/2010   adenomatous   Hx of colonoscopy    Hyperlipidemia    Hypertension    Hypertrophic cardiomyopathy ([place])    dx by Dr [name] 2009   Iron deficiency anemia    Myocardial infarction [facility]    Pacemaker    Panic attacks    Permanent atrial fibrillation ([place])    Renal failure    baseline creatinine 1.6   Right arm pain 01/08/2012   RLS (restless legs syndrome)    Dx 06/2007   Sleep apnea    no cpap     Past Surgical History:  Procedure Laterality Date   BIOPSY  09/04/2020   Procedure: BIOPSY;  Surgeon: Mauri Pole, MD;  Location: [facility];  Service: Endoscopy;;   CARDIAC CATHETERIZATION     CARDIAC CATHETERIZATION N/A 08/19/2016   Procedure: Right/Left Heart Cath and Coronary Angiography;  Surgeon: Jolaine Artist, MD;  Location: [place] CV LAB;  Service: Cardiovascular;  Laterality: N/A;   COLONOSCOPY WITH PROPOFOL N/A 06/16/2017   Procedure: COLONOSCOPY  WITH PROPOFOL;  Surgeon: Mauri Pole, MD;  Location: [facility];  Service: Endoscopy;  Laterality: N/A;   COLONOSCOPY WITH PROPOFOL N/A 09/04/2020   Procedure: COLONOSCOPY WITH PROPOFOL;  Surgeon: Mauri Pole, MD;  Location: [facility];  Service: Endoscopy;  Laterality: N/A;   DIRECT LARYNGOSCOPY N/A 09/18/2016   Procedure: DIRECT LARYNGOSCOPY, BRONCHOSCOPY, REMOVAL OF INTUBATION GRANULOMA;  Surgeon: [name], MD;  Location: [facility] OR;  Service: ENT;  Laterality: N/A;   DIRECT LARYNGOSCOPY N/A 10/20/2016   Procedure: EXTUBATION AND FLEXIBLE LARYNGOSCOPE;  Surgeon: [name], MD;  Location: [place];  Service: ENT;  Laterality: N/A;   DIRECT LARYNGOSCOPY N/A 10/29/2016   Procedure: DIRECT LARYNGOSCOPY;  Surgeon: [name], MD;  Location: [place];  Service: ENT;  Laterality: N/A;   ENTEROSCOPY N/A 09/30/2021   Procedure: ENTEROSCOPY;  Surgeon: Yetta Flock, MD;  Location: [facility];  Service: Gastroenterology;  Laterality: N/A;   ESOPHAGOGASTRODUODENOSCOPY  12/23/2011   Procedure: ESOPHAGOGASTRODUODENOSCOPY (EGD);  Surgeon: [name], MD;  Location: [facility];  Service: Endoscopy;  Laterality: N/A;   ESOPHAGOGASTRODUODENOSCOPY (EGD) WITH PROPOFOL N/A 06/16/2017   Procedure: ESOPHAGOGASTRODUODENOSCOPY (EGD) WITH PROPOFOL;  Surgeon: Mauri Pole, MD;  Location: [facility];  Service: Endoscopy;  Laterality: N/A;   ESOPHAGOGASTRODUODENOSCOPY (EGD) WITH PROPOFOL N/A 09/04/2020   Procedure: ESOPHAGOGASTRODUODENOSCOPY (EGD) WITH PROPOFOL;  Surgeon: Mauri Pole, MD;  Location: [facility];  Service: Endoscopy;  Laterality: N/A;   EXTUBATION (ENDOTRACHEAL) IN OR N/A 07/21/2016   Procedure: EXTUBATION (ENDOTRACHEAL) IN OR;  Surgeon: [name], MD;  Location: [place];  Service: ENT;  Laterality: N/A;   [place] STUDY  12/23/2011   Procedure: GIVENS CAPSULE STUDY;  Surgeon: [name], MD;  Location: [facility];  Service: Endoscopy;  Laterality: N/A;    HOT HEMOSTASIS N/A 09/04/2020   Procedure: HOT HEMOSTASIS (ARGON PLASMA COAGULATION/BICAP);  Surgeon: Mauri Pole, MD;  Location: [facility];  Service: Endoscopy;  Laterality: N/A;   HOT HEMOSTASIS N/A 09/30/2021   Procedure: HOT HEMOSTASIS (ARGON PLASMA COAGULATION/BICAP);  Surgeon: Yetta Flock, MD;  Location: [facility];  Service: Gastroenterology;  Laterality: N/A;   MICROLARYNGOSCOPY WITH LASER N/A 10/16/2016   Procedure: MICRODIRECTLARYNGOSCOPY WITH LASER ABLATION AND KENLOG INJECTION;  Surgeon: [name], MD;  Location: [place];  Service: ENT;  Laterality: N/A;   [place], most recent gen change by [facility] 4/12   POLYPECTOMY  09/04/2020   Procedure: POLYPECTOMY;  Surgeon: Mauri Pole, MD;  Location: [facility];  Service: Endoscopy;;   SUBMUCOSAL TATTOO INJECTION  09/30/2021   Procedure: SUBMUCOSAL TATTOO INJECTION;  Surgeon: Yetta Flock, MD;  Location: [facility];  Service: Gastroenterology;;   TRACHEOSTOMY TUBE PLACEMENT N/A 10/29/2016   Procedure: TRACHEOSTOMY;  Surgeon: [name], MD;  Location: [place];  Service: ENT;  Laterality: N/A;   TUBAL LIGATION  04/01/2000    MEDICATIONS: No current facility-administered medications for this encounter.    acetaminophen (TYLENOL) 650 MG CR tablet   albuterol (PROVENTIL HFA;VENTOLIN HFA) 108 (90 Base) MCG/ACT inhaler   ALPRAZolam (XANAX) 0.5 MG tablet   apixaban (ELIQUIS) 2.5 MG TABS tablet   bimatoprost (LUMIGAN) 0.01 % SOLN   brimonidine (ALPHAGAN) 0.2 % ophthalmic solution   cetirizine (ZYRTEC) 10 MG tablet   donepezil (ARICEPT ODT) 10 MG disintegrating tablet   Epoetin Alfa-epbx (RETACRIT IJ)   ferrous sulfate 325 (65 FE) MG tablet   ipratropium-albuterol (DUONEB) 0.5-2.5 (3) MG/3ML SOLN   metoprolol succinate (TOPROL XL) 25 MG 24 hr tablet   mirtazapine (REMERON) 7.5 MG tablet   nitroGLYCERIN (NITROSTAT) 0.4 MG SL tablet   Olopatadine HCl 0.2 % SOLN    Oxymetazoline HCl (AFRIN NASAL SPRAY NA)   rosuvastatin (CRESTOR) 40 MG tablet   sodium chloride (OCEAN) 0.65 % SOLN nasal spray   STIMULANT LAXATIVE 8.6-50 MG tablet    0.9 %  sodium chloride infusion    Myra Gianotti, PA-C Surgical Short Stay/Anesthesiology [facility] Phone [phone] [facility] Phone [phone] 09/08/2022 4:25 PM

## 2022-09-08 NOTE — Anesthesia Preprocedure Evaluation (Addendum)
Anesthesia Evaluation  Patient identified by MRN, date of birth, ID band Patient awake    Reviewed: Allergy & Precautions, NPO status , Patient's Chart, lab work & pertinent test results  Airway Mallampati: III  TM Distance: >3 FB Neck ROM: Full    Dental  (+) Teeth Intact   Pulmonary asthma , sleep apnea , COPD, former smoker,     + decreased breath sounds  rales    Cardiovascular hypertension, +CHF  + dysrhythmias Atrial Fibrillation + pacemaker  Rhythm:Regular Rate:Normal     Neuro/Psych PSYCHIATRIC DISORDERS Anxiety Depression CVA    GI/Hepatic Neg liver ROS, GERD  ,  Endo/Other  negative endocrine ROS  Renal/GU Renal disease  negative genitourinary   Musculoskeletal  (+) Arthritis ,   Abdominal Normal abdominal exam  (+)   Peds  Hematology negative hematology ROS (+)   Anesthesia Other Findings Rales clear with cough  Reproductive/Obstetrics                           Anesthesia Physical Anesthesia Plan  ASA: 4  Anesthesia Plan: General   Post-op Pain Management:    Induction: Intravenous  PONV Risk Score and Plan: 4 or greater and Ondansetron and Treatment may vary due to age or medical condition  Airway Management Planned: Tracheostomy  Additional Equipment: None  Intra-op Plan:   Post-operative Plan:   Informed Consent: I have reviewed the patients History and Physical, chart, labs and discussed the procedure including the risks, benefits and alternatives for the proposed anesthesia with the patient or authorized representative who has indicated his/her understanding and acceptance.       Plan Discussed with: CRNA  Anesthesia Plan Comments: (See PAT note written 09/08/2022 by Shonna Chock, PA-C.  S/p Trach in 2017. Per 09/03/22 ENT visit by Dr. Jenne Pane, she has a #4 flexible cuffless Shiley trach in place with a Passy-Muir valve.   Will replace cuffless trach for  reinforced endotracheal tube. Will not attempt PNB due patient's chronic respiratory compromise and risk for phrenic nerve involvement.  )       Anesthesia Quick Evaluation

## 2022-09-08 NOTE — Progress Notes (Signed)
PERIOPERATIVE PRESCRIPTION FOR IMPLANTED CARDIAC DEVICE PROGRAMMING  Patient Information: Name:  [name]  DOB:  [birth year]  MRN:  [id number]    Planned Procedure:  left upper arm Av graft  Surgeon:  Dr Deitra Mayo  Date of Procedure:  09/09/22  Cautery will be used.  Position during surgery:  prone   Please send documentation back to:  [facility] (Fax # [phone])  Device Information:  Clinic EP Physician:  Allegra Lai, MD   Device Type:  Pacemaker Manufacturer and Phone #:  [employer]: [phone] Pacemaker Dependent?:  Yes.   Date of Last Device Check:  04/16/2022 Normal Device Function?:  Yes.    Electrophysiologist's Recommendations:  Have magnet available. Provide continuous ECG monitoring when magnet is used or reprogramming is to be performed.  Procedure will likely interfere with device function.  Device should be programmed:  Asynchronous pacing during procedure and returned to normal programming after procedure  Per Device Clinic Standing Orders, Simone Curia, RN  8:24 AM 09/08/2022

## 2022-09-08 NOTE — Progress Notes (Signed)
PATIENT CARE CENTER NOTE   Diagnosis: Anemia    Provider: Erlene Quan, NP   Procedure: Feraheme infusion   Note:  Patient received Feraheme infusion (dose # 1 of 2) via PIV. Patient tolerated infusion well with no adverse reaction. Observed patient for 30 minutes post infusion. Vital signs stable. Discharge instructions given. Patient to come back in 2 weeks for second infusion and will scheduled appointment at the front desk. Patient alert, oriented and ambulatory with cane at discharge.

## 2022-09-08 NOTE — Progress Notes (Addendum)
patient's daughter voiced understanding of new arrival time of 0730 tomorrow

## 2022-09-09 ENCOUNTER — Ambulatory Visit (HOSPITAL_COMMUNITY): Payer: Medicare (Managed Care) | Admitting: Vascular Surgery

## 2022-09-09 ENCOUNTER — Ambulatory Visit (HOSPITAL_BASED_OUTPATIENT_CLINIC_OR_DEPARTMENT_OTHER): Payer: Medicare (Managed Care) | Admitting: Vascular Surgery

## 2022-09-09 ENCOUNTER — Ambulatory Visit (HOSPITAL_COMMUNITY)
Admission: RE | Admit: 2022-09-09 | Discharge: 2022-09-09 | Disposition: A | Discharge status: Home / Self Care | Payer: Medicare (Managed Care) | Attending: Vascular Surgery | Admitting: Vascular Surgery

## 2022-09-09 ENCOUNTER — Encounter (HOSPITAL_COMMUNITY)
Admission: RE | Disposition: A | Discharge status: Home / Self Care | Payer: Self-pay | Source: Home / Self Care | Attending: Vascular Surgery

## 2022-09-09 ENCOUNTER — Other Ambulatory Visit: Payer: Self-pay

## 2022-09-09 ENCOUNTER — Encounter (HOSPITAL_COMMUNITY): Payer: Self-pay | Admitting: Vascular Surgery

## 2022-09-09 DIAGNOSIS — Z7901 Long term (current) use of anticoagulants: Secondary | ICD-10-CM | POA: Insufficient documentation

## 2022-09-09 DIAGNOSIS — Z950 Presence of cardiac pacemaker: Secondary | ICD-10-CM | POA: Diagnosis not present

## 2022-09-09 DIAGNOSIS — G4730 Sleep apnea, unspecified: Secondary | ICD-10-CM | POA: Insufficient documentation

## 2022-09-09 DIAGNOSIS — N185 Chronic kidney disease, stage 5: Secondary | ICD-10-CM

## 2022-09-09 DIAGNOSIS — Z8673 Personal history of transient ischemic attack (TIA), and cerebral infarction without residual deficits: Secondary | ICD-10-CM | POA: Insufficient documentation

## 2022-09-09 DIAGNOSIS — I421 Obstructive hypertrophic cardiomyopathy: Secondary | ICD-10-CM | POA: Insufficient documentation

## 2022-09-09 DIAGNOSIS — I132 Hypertensive heart and chronic kidney disease with heart failure and with stage 5 chronic kidney disease, or end stage renal disease: Secondary | ICD-10-CM

## 2022-09-09 DIAGNOSIS — N184 Chronic kidney disease, stage 4 (severe): Secondary | ICD-10-CM | POA: Insufficient documentation

## 2022-09-09 DIAGNOSIS — I130 Hypertensive heart and chronic kidney disease with heart failure and stage 1 through stage 4 chronic kidney disease, or unspecified chronic kidney disease: Secondary | ICD-10-CM | POA: Insufficient documentation

## 2022-09-09 DIAGNOSIS — D631 Anemia in chronic kidney disease: Secondary | ICD-10-CM | POA: Insufficient documentation

## 2022-09-09 DIAGNOSIS — F418 Other specified anxiety disorders: Secondary | ICD-10-CM | POA: Diagnosis not present

## 2022-09-09 DIAGNOSIS — Z87891 Personal history of nicotine dependence: Secondary | ICD-10-CM | POA: Diagnosis not present

## 2022-09-09 DIAGNOSIS — I5032 Chronic diastolic (congestive) heart failure: Secondary | ICD-10-CM | POA: Insufficient documentation

## 2022-09-09 DIAGNOSIS — I4821 Permanent atrial fibrillation: Secondary | ICD-10-CM | POA: Diagnosis not present

## 2022-09-09 DIAGNOSIS — I252 Old myocardial infarction: Secondary | ICD-10-CM | POA: Insufficient documentation

## 2022-09-09 DIAGNOSIS — F03A0 Unspecified dementia, mild, without behavioral disturbance, psychotic disturbance, mood disturbance, and anxiety: Secondary | ICD-10-CM | POA: Insufficient documentation

## 2022-09-09 DIAGNOSIS — I509 Heart failure, unspecified: Secondary | ICD-10-CM | POA: Diagnosis not present

## 2022-09-09 DIAGNOSIS — I129 Hypertensive chronic kidney disease with stage 1 through stage 4 chronic kidney disease, or unspecified chronic kidney disease: Secondary | ICD-10-CM

## 2022-09-09 HISTORY — PX: AV FISTULA PLACEMENT: SHX1204

## 2022-09-09 LAB — POCT I-STAT, CHEM 8
BUN: 25 mg/dL — ABNORMAL HIGH (ref 8–23)
Calcium, Ion: 1.24 mmol/L (ref 1.15–1.40)
Chloride: 115 mmol/L — ABNORMAL HIGH (ref 98–111)
Creatinine, Ser: 3.7 mg/dL — ABNORMAL HIGH (ref 0.44–1.00)
Glucose, Bld: 79 mg/dL (ref 70–99)
HCT: 35 % — ABNORMAL LOW (ref 36.0–46.0)
Hemoglobin: 11.9 g/dL — ABNORMAL LOW (ref 12.0–15.0)
Potassium: 5.1 mmol/L (ref 3.5–5.1)
Sodium: 142 mmol/L (ref 135–145)
TCO2: 19 mmol/L — ABNORMAL LOW (ref 22–32)

## 2022-09-09 SURGERY — INSERTION OF ARTERIOVENOUS (AV) GORE-TEX GRAFT ARM
Anesthesia: General | Site: Arm Upper | Laterality: Left

## 2022-09-09 MED ORDER — LIDOCAINE HCL (PF) 1 % IJ SOLN
INTRAMUSCULAR | Status: AC
  Filled 2022-09-09: qty 30

## 2022-09-09 MED ORDER — LIDOCAINE-EPINEPHRINE (PF) 1 %-1:200000 IJ SOLN
INTRAMUSCULAR | Status: AC
  Filled 2022-09-09: qty 30

## 2022-09-09 MED ORDER — ROCURONIUM BROMIDE 10 MG/ML (PF) SYRINGE
PREFILLED_SYRINGE | INTRAVENOUS | Status: DC | PRN
  Administered 2022-09-09: 30 mg via INTRAVENOUS
  Administered 2022-09-09: 10 mg via INTRAVENOUS

## 2022-09-09 MED ORDER — LIDOCAINE 2% (20 MG/ML) 5 ML SYRINGE
INTRAMUSCULAR | Status: DC | PRN
  Administered 2022-09-09: 40 mg via INTRAVENOUS

## 2022-09-09 MED ORDER — CHLORHEXIDINE GLUCONATE 0.12 % MT SOLN
15.0000 mL | OROMUCOSAL | Status: AC
  Administered 2022-09-09: 15 mL via OROMUCOSAL

## 2022-09-09 MED ORDER — FENTANYL CITRATE (PF) 250 MCG/5ML IJ SOLN
INTRAMUSCULAR | Status: AC
  Filled 2022-09-09: qty 5

## 2022-09-09 MED ORDER — PHENYLEPHRINE 80 MCG/ML (10ML) SYRINGE FOR IV PUSH (FOR BLOOD PRESSURE SUPPORT)
PREFILLED_SYRINGE | INTRAVENOUS | Status: DC | PRN
  Administered 2022-09-09 (×2): 80 ug via INTRAVENOUS

## 2022-09-09 MED ORDER — OXYCODONE-ACETAMINOPHEN 5-325 MG PO TABS: 1.0000 | ORAL_TABLET | ORAL | 0 refills | Status: DC | PRN

## 2022-09-09 MED ORDER — PROPOFOL 10 MG/ML IV BOLUS
INTRAVENOUS | Status: DC | PRN
  Administered 2022-09-09: 60 mg via INTRAVENOUS

## 2022-09-09 MED ORDER — DEXAMETHASONE SODIUM PHOSPHATE 10 MG/ML IJ SOLN
INTRAMUSCULAR | Status: DC | PRN
  Administered 2022-09-09: 10 mg via INTRAVENOUS

## 2022-09-09 MED ORDER — HEPARIN SODIUM (PORCINE) 1000 UNIT/ML IJ SOLN
INTRAMUSCULAR | Status: DC | PRN
  Administered 2022-09-09: 5000 [IU] via INTRAVENOUS

## 2022-09-09 MED ORDER — CHLORHEXIDINE GLUCONATE 4 % EX LIQD: 60.0000 mL | Freq: Once | CUTANEOUS | Status: DC

## 2022-09-09 MED ORDER — HEPARIN 6000 UNIT IRRIGATION SOLUTION
Status: AC
  Filled 2022-09-09: qty 500

## 2022-09-09 MED ORDER — PROTAMINE SULFATE 10 MG/ML IV SOLN
INTRAVENOUS | Status: DC | PRN
  Administered 2022-09-09: 30 mg via INTRAVENOUS

## 2022-09-09 MED ORDER — SUGAMMADEX SODIUM 200 MG/2ML IV SOLN
INTRAVENOUS | Status: DC | PRN
  Administered 2022-09-09: 200 mg via INTRAVENOUS

## 2022-09-09 MED ORDER — FENTANYL CITRATE (PF) 250 MCG/5ML IJ SOLN
INTRAMUSCULAR | Status: DC | PRN
  Administered 2022-09-09: 50 ug via INTRAVENOUS

## 2022-09-09 MED ORDER — ONDANSETRON HCL 4 MG/2ML IJ SOLN
INTRAMUSCULAR | Status: DC | PRN
  Administered 2022-09-09: 4 mg via INTRAVENOUS

## 2022-09-09 MED ORDER — 0.9 % SODIUM CHLORIDE (POUR BTL) OPTIME
TOPICAL | Status: DC | PRN
  Administered 2022-09-09: 1000 mL

## 2022-09-09 MED ORDER — HEPARIN 6000 UNIT IRRIGATION SOLUTION
Status: DC | PRN
  Administered 2022-09-09: 1

## 2022-09-09 MED ORDER — CEFAZOLIN SODIUM-DEXTROSE 2-4 GM/100ML-% IV SOLN
2.0000 g | INTRAVENOUS | Status: AC
  Administered 2022-09-09: 2 g via INTRAVENOUS

## 2022-09-09 MED ORDER — SODIUM CHLORIDE 0.9 % IV SOLN: INTRAVENOUS | Status: DC

## 2022-09-09 SURGICAL SUPPLY — 34 items
ADH SKN CLS APL DERMABOND .7 (GAUZE/BANDAGES/DRESSINGS) ×1
ARMBAND PINK RESTRICT EXTREMIT (MISCELLANEOUS) ×2 IMPLANT
BAG COUNTER SPONGE SURGICOUNT (BAG) ×1 IMPLANT
BAG SPNG CNTER NS LX DISP (BAG) ×1
CANISTER SUCT 3000ML PPV (MISCELLANEOUS) ×1 IMPLANT
CANNULA VESSEL 3MM 2 BLNT TIP (CANNULA) ×1 IMPLANT
CLIP VESOCCLUDE MED 6/CT (CLIP) ×1 IMPLANT
CLIP VESOCCLUDE SM WIDE 6/CT (CLIP) ×1 IMPLANT
DERMABOND ADVANCED .7 DNX12 (GAUZE/BANDAGES/DRESSINGS) ×1 IMPLANT
ELECT REM PT RETURN 9FT ADLT (ELECTROSURGICAL) ×1
ELECTRODE REM PT RTRN 9FT ADLT (ELECTROSURGICAL) ×1 IMPLANT
GLOVE BIO SURGEON STRL SZ7.5 (GLOVE) ×1 IMPLANT
GLOVE BIOGEL PI IND STRL 8 (GLOVE) ×1 IMPLANT
GLOVE SURG POLY ORTHO LF SZ7.5 (GLOVE) IMPLANT
GLOVE SURG UNDER LTX SZ8 (GLOVE) ×1 IMPLANT
GOWN STRL REUS W/ TWL LRG LVL3 (GOWN DISPOSABLE) ×3 IMPLANT
GOWN STRL REUS W/TWL LRG LVL3 (GOWN DISPOSABLE) ×3
KIT BASIN OR (CUSTOM PROCEDURE TRAY) ×1 IMPLANT
KIT TURNOVER KIT B (KITS) ×1 IMPLANT
NS IRRIG 1000ML POUR BTL (IV SOLUTION) ×1 IMPLANT
PACK CV ACCESS (CUSTOM PROCEDURE TRAY) ×1 IMPLANT
PAD ARMBOARD 7.5X6 YLW CONV (MISCELLANEOUS) ×2 IMPLANT
SLING ARM FOAM STRAP LRG (SOFTGOODS) IMPLANT
SLING ARM FOAM STRAP MED (SOFTGOODS) IMPLANT
SPIKE FLUID TRANSFER (MISCELLANEOUS) ×1 IMPLANT
SPONGE SURGIFOAM ABS GEL 100 (HEMOSTASIS) IMPLANT
SUT MNCRL AB 4-0 PS2 18 (SUTURE) ×2 IMPLANT
SUT PROLENE 6 0 BV (SUTURE) ×2 IMPLANT
SUT VIC AB 3-0 SH 27 (SUTURE) ×2
SUT VIC AB 3-0 SH 27X BRD (SUTURE) ×2 IMPLANT
SYR TOOMEY 50ML (SYRINGE) IMPLANT
TOWEL GREEN STERILE (TOWEL DISPOSABLE) ×1 IMPLANT
UNDERPAD 30X36 HEAVY ABSORB (UNDERPADS AND DIAPERS) ×1 IMPLANT
WATER STERILE IRR 1000ML POUR (IV SOLUTION) ×1 IMPLANT

## 2022-09-09 NOTE — Discharge Instructions (Signed)
[facility]  Discharge Instructions  AV Fistula or Graft Surgery for Dialysis Access  Please refer to the following instructions for your post-procedure care. Your surgeon or physician assistant will discuss any changes with you.  Activity  You may drive the day following your surgery, if you are comfortable and no longer taking prescription pain medication. Resume full activity as the soreness in your incision resolves.  Bathing/Showering  You may shower after you go home. Keep your incision dry for 48 hours. Do not soak in a bathtub, hot tub, or swim until the incision heals completely. You may not shower if you have a hemodialysis catheter.  Incision Care  Clean your incision with mild soap and water after 48 hours. Pat the area dry with a clean towel. You do not need a bandage unless otherwise instructed. Do not apply any ointments or creams to your incision. You may have skin glue on your incision. Do not peel it off. It will come off on its own in about one week. Your arm may swell a bit after surgery. To reduce swelling use pillows to elevate your arm so it is above your heart. Your doctor will tell you if you need to lightly wrap your arm with an ACE bandage.  Diet  Resume your normal diet. There are not special food restrictions following this procedure. In order to heal from your surgery, it is CRITICAL to get adequate nutrition. Your body requires vitamins, minerals, and protein. Vegetables are the best source of vitamins and minerals. Vegetables also provide the perfect balance of protein. Processed food has little nutritional value, so try to avoid this.  Medications  Resume taking all of your medications. If your incision is causing pain, you may take over-the counter pain relievers such as acetaminophen (Tylenol). If you were prescribed a stronger pain medication, please be aware these medications can cause nausea and constipation. Prevent  nausea by taking the medication with a snack or meal. Avoid constipation by drinking plenty of fluids and eating foods with high amount of fiber, such as fruits, vegetables, and grains.  Do not take Tylenol if you are taking prescription pain medications.  Follow up Your surgeon may want to see you in the office following your access surgery. If so, this will be arranged at the time of your surgery.  Please call us immediately for any of the following conditions:  Increased pain, redness, drainage (pus) from your incision site Fever of 101 degrees or higher Severe or worsening pain at your incision site Hand pain or numbness.  Reduce your risk of vascular disease:  Stop smoking. If you would like help, call [facility] at 1-800-QUIT-NOW 249-664-5856) or [place] [facility] [place] your cholesterol Maintain a desired weight Control your diabetes Keep your blood pressure down  Dialysis  It will take several weeks to several months for your new dialysis access to be ready for use. Your surgeon will determine when it is okay to use it. Your nephrologist will continue to direct your dialysis. You can continue to use your Permcath until your new access is ready for use.   09/09/2022 Enzley Kitchens [id number] August 31, 1946  Surgeon(s): Angelia Mould, MD  Procedure(s): INSERTION OF LEFT UPPER ARM ARTERIOVENOUS (AV) GORE-TEX GRAFT   May stick graft immediately   May stick graft on designated area only:   X Do not stick left AV graft for 6 weeks    If you have any questions, please call  the office at [phone].

## 2022-09-09 NOTE — Anesthesia Procedure Notes (Signed)
Procedure Name: Intubation Date/Time: 09/09/2022 10:42 AM  Performed by: Effie Berkshire, MDPre-anesthesia Checklist: Patient identified, Emergency Drugs available, Suction available and Patient being monitored Patient Re-evaluated:Patient Re-evaluated prior to induction Oxygen Delivery Method: Circle System Utilized Preoxygenation: Pre-oxygenation with 100% oxygen Induction Type: IV induction and Tracheostomy Ventilation: Mask ventilation without difficulty Tube type: Reinforced Tube size: 6.0 mm Number of attempts: 1 Placement Confirmation: positive ETCO2 and breath sounds checked- equal and bilateral Tube secured with: Tape Dental Injury: Teeth and Oropharynx as per pre-operative assessment  Comments: Pts trach removed and replaced with reinforced tube by MDA  Robert

## 2022-09-09 NOTE — Transfer of Care (Signed)
Immediate Anesthesia Transfer of Care Note  Patient: [name]  Procedure(s) Performed: LEFT BRACHEOCEPHALIC FISTULA CREATION (Left: Arm Upper)  Patient Location: PACU  Anesthesia Type:General  Level of Consciousness: drowsy and patient cooperative  Airway & Oxygen Therapy: Patient Spontanous Breathing  Post-op Assessment: Report given to RN and Post -op Vital signs reviewed and stable  Post vital signs: Reviewed and stable  Last Vitals:  Vitals Value Taken Time  BP 118/74 09/09/22 1245  Temp 36.6 C 09/09/22 1245  Pulse 70 09/09/22 1245  Resp 16 09/09/22 1245  SpO2 100 % 09/09/22 1245  Vitals shown include unvalidated device data.  Last Pain:  Vitals:   09/09/22 1215  TempSrc:   PainSc: 0-No pain      Patients Stated Pain Goal: 0 (45/99/77 4142)  Complications: No notable events documented.

## 2022-09-09 NOTE — Op Note (Signed)
    NAME: [name]    MRN: [id number] DOB: [birth year]    DATE OF OPERATION: 09/09/2022  PREOP DIAGNOSIS:    Stage V chronic kidney disease  POSTOP DIAGNOSIS:    Same  PROCEDURE:    Left ulnarcephalic AV fistula  SURGEON: Judeth Cornfield. Scot Dock, MD  ASSIST: Karoline Caldwell, PA  ANESTHESIA: General  EBL: Minimal  INDICATIONS:    [name] is a 76 y.o. female who is not on dialysis.  She presents for placement of an AV fistula.  If we could not place a fistula we were asked not to place an AV graft.  FINDINGS:   3 mm upper arm cephalic vein.  High bifurcation of the brachial artery.  The anastomosis was to the ulnar artery.  TECHNIQUE:   Patient was taken to the operating room and received a general anesthetic.  I looked at the left arm with the SonoSite.  The cephalic vein was small but I felt it might be reasonable for a fistula.  The basilic vein looks reasonable in size.  The left arm was prepped and draped in usual sterile fashion.  Transverse incision was made just above the antecubital level.  Here the ulnar artery was dissected free beneath the fascia.  The cephalic vein was dissected free and ligated distally.  It irrigated up with heparinized saline was a 3 mm vein.  The patient was heparinized.  The Ulnar artery was clamped proximally distally and a longitudinal arteriotomy was made.  The patient had a high bifurcation of the brachial artery.  The vein was sewn into side of the artery using continuous 6-0 Prolene suture.  The completion there was a good thrill in the fistula.  There was a brisk radial and ulnar signal with the Doppler.  Hemostasis was obtained in the wound.  The heparin was partially reversed with protamine.  The wound was closed with a deep layer of 3-0 Vicryl and the skin closed with 4-0 Monocryl.  Dermabond was applied.  The patient tolerated the procedure well was transferred to recovery room in stable condition.  All needle and sponge  counts were correct.  Given the complexity of the case,  the assistant was necessary in order to expedient the procedure and safely perform the technical aspects of the operation.  The assistant provided traction and countertraction to assist with exposure of the artery and vein.  They also assisted with suture ligation of multiple venous branches.  They played a critical role in the anastomosis. These skills, especially following the Prolene suture for the anastomosis, could not have been adequately performed by a scrub tech assistant.    Deitra Mayo, MD, FACS [facility]  DATE OF DICTATION:   09/09/2022

## 2022-09-09 NOTE — H&P (Signed)
ASSESSMENT & PLAN   STAGE IV CHRONIC KIDNEY DISEASE: I saw this patient in consultation on 07/17/2022 for new hemodialysis access.  We were asked to place an AV fistula.  If this were not possible we were asked not to place an AV graft.  Based on her vein map it looks like she is she would be a candidate for a fistula in either arm.  She has a pacemaker on the right so we recommended access in the left arm.  I did look at her artery myself with the SonoSite and she appears to have a high bifurcation of the brachial artery on the left.  REASON FOR VISIT:    For new hemodialysis access  HPI:   [name] is a 76 y.o. female who I saw in consultation on 07/17/2022.  She had stage IV chronic kidney disease.  She was right-handed.  We are asked to place an AV fistula if possible.  If we could not place a fistula we were asked to wait before placing an AV graft.  She has a history of a laryngeal and tracheal stenosis and is status post tracheostomy in 2017.  She has MULTIPLE other medical issues including cardiomyopathy, atrial fibrillation, and congestive heart failure.  She has some mild underlying dementia.  She has a English as a second language teacher on the right.  She is on Eliquis for A-fib.  Past Medical History:  Diagnosis Date   Anemia    Angioedema    2/2 ACE   Arteriovenous malformation of stomach    Arthritis    Asthma    AVM (arteriovenous malformation) of colon    small intestine; stomach   Blood transfusion    CHF (congestive heart failure) (HCC)    Complete heart block (HCC)    s/p PPM 6195   Complication of anesthesia    Difficult airway; anaphylaxis and swelling with propofol   DDD (degenerative disc disease)    Depression    Diastolic heart failure    Fatty liver 07/26/2010   GERD (gastroesophageal reflux disease)    GI bleed    History of alcohol abuse Stopped Fall 2012   History of tobacco use Quit Fall 2012   Hx of cardiovascular stress test    a. Lexiscan Myoview  (10/15):  Small inferolateral and apical defect c/w scar and poss soft tissue attenuation, no ischemia, EF 42%   Hx of colonic polyp 08/13/2010   adenomatous   Hx of colonoscopy    Hyperlipidemia    Hypertension    Hypertrophic cardiomyopathy (HCC)    dx by Dr [name] 2009   Iron deficiency anemia    Myocardial infarction [facility])    Pacemaker    Panic attacks    Permanent atrial fibrillation ([place])    Renal failure    baseline creatinine 1.6   Right arm pain 01/08/2012   RLS (restless legs syndrome)    Dx 06/2007   Sleep apnea    no cpap     Family History  Problem Relation Age of Onset   Hypertension Mother    Stroke Mother    Heart disease Mother    Aneurysm Mother    CVA Mother    Hypertension Father    Stroke Father    Heart attack Father    Alcohol abuse Father    Hypertension Sister    Hypertension Brother    Colon cancer Brother    Cancer Brother    Diabetes Sister    Diabetes Brother  Heart attack Brother    Heart attack Brother    Heart disease Sister    Alcohol abuse Brother    Heart disease Daughter    Esophageal cancer Neg Hx    Stomach cancer Neg Hx    Rectal cancer Neg Hx     SOCIAL HISTORY: Social History   Tobacco Use   Smoking status: Former    Packs/day: 0.10    Years: 50.00    Total pack years: 5.00    Types: Cigarettes    Quit date: 08/16/2011    Years since quitting: 11.0   Smokeless tobacco: Never   Tobacco comments:    quit 2012  Substance Use Topics   Alcohol use: No    Comment: quit 08/16/2011    Allergies  Allergen Reactions   Ace Inhibitors Swelling    angiodema   Other Anaphylaxis and Swelling    Reaction to unspecified anesthesia    Propofol     Dysrhythmia likely to drop in afterload post induction with Propofol. Did not have anaphylactic symptoms with MAC in 2018    Current Facility-Administered Medications  Medication Dose Route Frequency Provider Last Rate Last Admin   0.9 %  sodium chloride infusion    Intravenous Continuous Angelia Mould, MD 10 mL/hr at 09/09/22 1002 Continued from Pre-op at 09/09/22 1002   ceFAZolin (ANCEF) IVPB 2g/100 mL premix  2 g Intravenous To SS-Surg Angelia Mould, MD       chlorhexidine (HIBICLENS) 4 % liquid 4 Application  60 mL Topical Once Angelia Mould, MD       And   [START ON 09/10/2022] chlorhexidine (HIBICLENS) 4 % liquid 4 Application  60 mL Topical Once Angelia Mould, MD        REVIEW OF SYSTEMS:  _0  denotes positive finding, _1  denotes negative finding Cardiac  Comments:  Chest pain or chest pressure:    Shortness of breath upon exertion: x   Short of breath when lying flat:    Irregular heart rhythm:        Vascular    Pain in calf, thigh, or hip brought on by ambulation:    Pain in feet at night that wakes you up from your sleep:     Blood clot in your veins:    Leg swelling:  x       Pulmonary    Oxygen at home:    Productive cough:     Wheezing:         Neurologic    Sudden weakness in arms or legs:     Sudden numbness in arms or legs:     Sudden onset of difficulty speaking or slurred speech:    Temporary loss of vision in one eye:     Problems with dizziness:         Gastrointestinal    Blood in stool:     Vomited blood:         Genitourinary    Burning when urinating:     Blood in urine:        Psychiatric    Major depression:         Hematologic    Bleeding problems:    Problems with blood clotting too easily:        Skin    Rashes or ulcers:        Constitutional    Fever or chills:    -  PHYSICAL EXAM:   Vitals:  09/09/22 0747  BP: 122/78  Pulse: 69  Resp: 18  Temp: 97.8 F (36.6 C)  TempSrc: Oral  SpO2: 97%  Weight: 52.2 kg  Height: _0  (1.626 m)   Body mass index is 19.74 kg/m. GENERAL: The patient is a well-nourished female, in no acute distress. The vital signs are documented above. CARDIAC: There is a regular rate and rhythm.  VASCULAR: I do not detect  carotid bruits. She is palpable radial pulses bilaterally. PULMONARY: There is good air exchange bilaterally without wheezing or rales. ABDOMEN: Soft and non-tender with normal pitched bowel sounds.  MUSCULOSKELETAL: There are no major deformities. NEUROLOGIC: No focal weakness or paresthesias are detected. SKIN: There are no ulcers or rashes noted. PSYCHIATRIC: The patient has a normal affect.  DATA:    BILATERAL UPPER EXTREMITY VEIN MAP: I have independently interpreted her upper extremity vein map today.   On the right side the forearm and upper arm cephalic vein do not appear adequate in size.  The basilic vein does not appear adequate in size.   On the left side the forearm and upper arm cephalic vein do not appear adequate in size.  The basilic vein does not appear adequate in size.   UPPER EXTREMITY ARTERIAL DUPLEX: I have independently interpreted her upper extremity arterial duplex.   On the right side there is a triphasic radial signal and a biphasic ulnar signal with the Doppler.  The brachial artery measures 4 mm in diameter.   On the left side there is a triphasic radial and ulnar signal.  The brachial artery measures 2.6 mm in diameter.  Deitra Mayo [facility]

## 2022-09-09 NOTE — Anesthesia Postprocedure Evaluation (Signed)
Anesthesia Post Note  Patient: [name]  Procedure(s) Performed: LEFT BRACHEOCEPHALIC FISTULA CREATION (Left: Arm Upper)     Patient location during evaluation: PACU Anesthesia Type: General Level of consciousness: awake and alert Pain management: pain level controlled Vital Signs Assessment: post-procedure vital signs reviewed and stable Respiratory status: spontaneous breathing, nonlabored ventilation, respiratory function stable and patient connected to nasal cannula oxygen Cardiovascular status: blood pressure returned to baseline and stable Postop Assessment: no apparent nausea or vomiting Anesthetic complications: no   No notable events documented.  Last Vitals:  Vitals:   09/09/22 1230 09/09/22 1245  BP: 110/66 118/74  Pulse: 68 69  Resp: 17 17  Temp:  36.6 C  SpO2: 99% 98%    Last Pain:  Vitals:   09/09/22 1215  TempSrc:   PainSc: 0-No pain                 Effie Berkshire

## 2022-09-10 ENCOUNTER — Encounter (HOSPITAL_COMMUNITY): Payer: Self-pay | Admitting: Vascular Surgery

## 2022-09-10 ENCOUNTER — Telehealth: Payer: Self-pay | Admitting: Vascular Surgery

## 2022-09-10 NOTE — Telephone Encounter (Signed)
-----   Message from Angelia Mould, MD sent at 09/09/2022 12:17 PM EDT ----- Regarding: f/u This patient needs a follow-up visit in 6 weeks to check on the maturation of her left brachiocephalic fistula.  Thank you. CD

## 2022-09-10 NOTE — Telephone Encounter (Signed)
Appt has been scheduled.

## 2022-09-22 ENCOUNTER — Non-Acute Institutional Stay (HOSPITAL_COMMUNITY)
Admission: RE | Admit: 2022-09-22 | Discharge: 2022-09-22 | Disposition: A | Discharge status: Home / Self Care | Payer: Medicare (Managed Care) | Source: Ambulatory Visit | Attending: Internal Medicine | Admitting: Internal Medicine

## 2022-09-22 DIAGNOSIS — D649 Anemia, unspecified: Secondary | ICD-10-CM | POA: Diagnosis present

## 2022-09-22 MED ORDER — SODIUM CHLORIDE 0.9 % IV SOLN: INTRAVENOUS | Status: DC | PRN

## 2022-09-22 MED ORDER — SODIUM CHLORIDE 0.9 % IV SOLN
510.0000 mg | Freq: Once | INTRAVENOUS | Status: AC
  Administered 2022-09-22: 510 mg via INTRAVENOUS
  Filled 2022-09-22: qty 510

## 2022-09-22 NOTE — Progress Notes (Signed)
PATIENT CARE CENTER NOTE     Diagnosis: Anemia      Provider: Erlene Quan, NP     Procedure: Feraheme infusion     Note:  Patient received Feraheme infusion (dose # 2 of 2) via PIV. Patient tolerated infusion well with no adverse reaction. Observed patient for 30 minutes post infusion. Vital signs stable. Discharge instructions given. Patient alert, oriented and ambulatory with cane at discharge. Discharged home with daughter.

## 2022-09-23 ENCOUNTER — Ambulatory Visit (INDEPENDENT_AMBULATORY_CARE_PROVIDER_SITE_OTHER): Payer: Medicare (Managed Care) | Admitting: Cardiovascular Disease

## 2022-09-23 ENCOUNTER — Encounter (HOSPITAL_BASED_OUTPATIENT_CLINIC_OR_DEPARTMENT_OTHER): Payer: Self-pay | Admitting: Cardiovascular Disease

## 2022-09-23 DIAGNOSIS — I4821 Permanent atrial fibrillation: Secondary | ICD-10-CM

## 2022-09-23 DIAGNOSIS — I10 Essential (primary) hypertension: Secondary | ICD-10-CM

## 2022-09-23 DIAGNOSIS — I509 Heart failure, unspecified: Secondary | ICD-10-CM | POA: Diagnosis not present

## 2022-09-23 DIAGNOSIS — I422 Other hypertrophic cardiomyopathy: Secondary | ICD-10-CM

## 2022-09-23 DIAGNOSIS — N184 Chronic kidney disease, stage 4 (severe): Secondary | ICD-10-CM

## 2022-09-23 DIAGNOSIS — K5520 Angiodysplasia of colon without hemorrhage: Secondary | ICD-10-CM | POA: Diagnosis not present

## 2022-09-23 MED ORDER — FUROSEMIDE 20 MG PO TABS: 20.0000 mg | ORAL_TABLET | Freq: Every day | ORAL | 1 refills | Status: DC | PRN

## 2022-09-23 NOTE — Assessment & Plan Note (Signed)
History of small bowel AVMs.  She is stable on low-dose Eliquis.

## 2022-09-23 NOTE — Assessment & Plan Note (Addendum)
She had a rapid deterioration in her renal function over the last year.  She sees Dr. Carolin Sicks.  Testing was negative for ATTR amyloidosis.  She recently had an AV fistula placed.  Furosemide was stopped but we will add an as needed dose due to lower extremity edema as above.  Continue to avoid other nephrotoxins.  No acute HD indications at this time.

## 2022-09-23 NOTE — Progress Notes (Signed)
Cardiology Office Note  Date:  09/23/2022   ID:  [name], [name] 1946-01-17, MRN [id number]  PCP:  [facility] [place]  Cardiologist:   Skeet Latch, MD  Electrophysiologist: Thompson Grayer, MD  No chief complaint on file.  History of Present Illness: [name] is a 76 y.o. female with hypertrophic cardiomyopathy, hypertension, hyperlipidemia, CKD IV, chronic atrial fibrillation, (not anticoagulated due to GI bleed), chronic diastolic heart failure, complete heart block status post pacemaker ([employer]. [employer]), and COPD who presents for follow up. [name] was first seen on 12/14/15 as a referral from Dr. Rayann Heman. She was referred to general cardiology to establish care. She had been admitted to the hospital earlier that month with a complaint of chest pain and was noted to be mildly volume overloaded. Her BNP was 436 and echo revealed normal systolic function with grade 2 diastolic dysfunction. She was diuresed with IV Lasix and developed acute renal failure. Troponin was mildly elevated to 0.19.  Lexiscan Cardiolite was negative for ischemia.  [name] continued to struggle with palpitations and dizziness.  She has been in chronic atrial fibrillation and has not been on anticoagulation due to a GI bleed in 2012. She has known AVMs. She was referred for consideration of a Watchman device 04/3975, which was complicated by acute respiratory distress during the TEE that required intubation.  She developed acute biventricular failure that eventually normalized.  She was discharged to a SNF and was then readmitted with volume overload.  She has struggled with laryngeal stenosis managed by her ENT in [facility].  She is not a candidate for removal of her trach due to residual stenosis.  She has also been admitted with stroke 04/2019 with residual expressive aphasia.  She was subsequently started on Eliquis despite her GI bleed history.  The following  month she noted blood in her stool but h/h was stable.  She was noted to have acute on chronic renal failure and was referred to nephrology.  She last saw [name] 05/2019 and was doing well from a cardiac standpoint, but was frustrated by expressive aphasia.   Metoprolol was added due to HCM, palpitations, and dyspnea.  Her palpitations improved.  She saw ENT at [facility] and was thinking about haivng the trach removed. She followed up with [name], PA on 08/2021. There were continued questions about stopping Eliquis due to chronic GI bleeding requiring transfusions. Eliquis was reduced to 2.5 mg to try to lower her bleeding risk.  She reported tingling in bilateral hands and occasional chest pain.  She was referred for an echo which revealed LVEF 40 to 45% with severe concentric LVH and speckled myocardium concerning for infiltrative cardiomyopathy.  The right ventricle was speckled as well.  She was referred for cardiac MRI but could not get it because of her ICD.  She notes a strong family history of many family members with heart failure.  One brother did die of sudden cardiac death.  She had small bowel enteroscopy with Dr. [name] on 09/30/2021.  She was found to have a single small angiodysplastic lesion in the gastric body.  It was not bleeding at the time.  She was treated with APC.  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  There were 4 lesions treated in the duodenum, 1 in the jejunum; she was instructed to resume her Eliquis the following day.  She had a PYP scan 11/2021 that was  negative for amyloid. BNP was elevated and she was started back on lasix. She saw our geneticist but declined genetic testing due to cost. She saw her retina specialist and had severe hypertensive retinopathy. They recommended tight blood pressure control.   At the last appointment she remained in rate controlled atrial fibrillation. She was seen in EP clinic 04/2022  and felt fatigued, but was otherwise stable. She was seen by heart failure service 07/2022 and they switched metoprolol tartrate to succinate. They again discussed genetic testing and she agreed. She had an AV fistula placed 09/09/22. She recently saw her nephrologist and they stopped her furosemide. Since then she has gained 15 lbs.   Today, she is accompanied by a family member. Her main complaint today is having a lot of mucous/congestion that has been more difficult for her to cough up lately. She denies any significant shortness of breath. However she does become fatigued easily, such as when walking from the bedroom to the living room. She then needs to sit down and rest. Sometimes she feels a little dizzy while she is standing. Since Friday she has noticed a little swelling around her ankles. She does try to monitor her salt intake. Recently her appetite has increased and she is eating more. She denies any palpitations, headaches, syncope, orthopnea, or PND.  Past Medical History:  Diagnosis Date   Anemia    Angioedema    2/2 ACE   Arteriovenous malformation of stomach    Arthritis    Asthma    AVM (arteriovenous malformation) of colon    small intestine; stomach   Blood transfusion    CHF (congestive heart failure) (HCC)    CKD (chronic kidney disease) stage 4, GFR 15-29 ml/min ([place]) 09/24/2010   Baseline Cr 1.3-1.6   Complete heart block (HCC)    s/p PPM 4656   Complication of anesthesia    Difficult airway; anaphylaxis and swelling with propofol   DDD (degenerative disc disease)    Depression    Diabetes mellitus, type 2 ([place])    Diastolic heart failure    Fatty liver 07/26/2010   GERD (gastroesophageal reflux disease)    GI bleed    History of alcohol abuse Stopped Fall 2012   History of tobacco use Quit Fall 2012   Hx of cardiovascular stress test    a. Lexiscan Myoview (10/15):  Small inferolateral and apical defect c/w scar and poss soft tissue attenuation, no ischemia, EF  42%   Hx of colonic polyp 08/13/2010   adenomatous   Hx of colonoscopy    Hyperlipidemia    Hypertension    Hypertrophic cardiomyopathy ([place])    dx by Dr [name] 2009   Iron deficiency anemia    Myocardial infarction [facility])    Pacemaker    Panic attacks    Permanent atrial fibrillation ([place])    Renal failure    baseline creatinine 1.6   Right arm pain 01/08/2012   RLS (restless legs syndrome)    Dx 06/2007   Sleep apnea    no cpap     Past Surgical History:  Procedure Laterality Date   AV FISTULA PLACEMENT Left 09/09/2022   Procedure: LEFT BRACHEOCEPHALIC FISTULA CREATION;  Surgeon: Angelia Mould, MD;  Location: [place];  Service: Vascular;  Laterality: Left;   BIOPSY  09/04/2020   Procedure: BIOPSY;  Surgeon: Mauri Pole, MD;  Location: [facility];  Service: Endoscopy;;   CARDIAC CATHETERIZATION     CARDIAC CATHETERIZATION N/A 08/19/2016  Procedure: Right/Left Heart Cath and Coronary Angiography;  Surgeon: Jolaine Artist, MD;  Location: [place] CV LAB;  Service: Cardiovascular;  Laterality: N/A;   COLONOSCOPY WITH PROPOFOL N/A 06/16/2017   Procedure: COLONOSCOPY WITH PROPOFOL;  Surgeon: Mauri Pole, MD;  [facility];  Service: Endoscopy;  Laterality: N/A;   COLONOSCOPY WITH PROPOFOL N/A 09/04/2020   Procedure: COLONOSCOPY WITH PROPOFOL;  Surgeon: Mauri Pole, MD;  [facility];  Service: Endoscopy;  Laterality: N/A;   DIRECT LARYNGOSCOPY N/A 09/18/2016   Procedure: DIRECT LARYNGOSCOPY, BRONCHOSCOPY, REMOVAL OF INTUBATION GRANULOMA;  Surgeon: [name], MD;  Location: [facility] OR;  Service: ENT;  Laterality: N/A;   DIRECT LARYNGOSCOPY N/A 10/20/2016   Procedure: EXTUBATION AND FLEXIBLE LARYNGOSCOPE;  Surgeon: [name], MD;  Location: [place];  Service: ENT;  Laterality: N/A;   DIRECT LARYNGOSCOPY N/A 10/29/2016   Procedure: DIRECT LARYNGOSCOPY;  Surgeon: [name], MD;  Location: [place];  Service: ENT;  Laterality: N/A;    ENTEROSCOPY N/A 09/30/2021   Procedure: ENTEROSCOPY;  Surgeon: Yetta Flock, MD;  [facility];  Service: Gastroenterology;  Laterality: N/A;   ESOPHAGOGASTRODUODENOSCOPY  12/23/2011   Procedure: ESOPHAGOGASTRODUODENOSCOPY (EGD);  Surgeon: [name], MD;  [facility];  Service: Endoscopy;  Laterality: N/A;   ESOPHAGOGASTRODUODENOSCOPY (EGD) WITH PROPOFOL N/A 06/16/2017   Procedure: ESOPHAGOGASTRODUODENOSCOPY (EGD) WITH PROPOFOL;  Surgeon: Mauri Pole, MD;  [facility];  Service: Endoscopy;  Laterality: N/A;   ESOPHAGOGASTRODUODENOSCOPY (EGD) WITH PROPOFOL N/A 09/04/2020   Procedure: ESOPHAGOGASTRODUODENOSCOPY (EGD) WITH PROPOFOL;  Surgeon: Mauri Pole, MD;  [facility];  Service: Endoscopy;  Laterality: N/A;   EXTUBATION (ENDOTRACHEAL) IN OR N/A 07/21/2016   Procedure: EXTUBATION (ENDOTRACHEAL) IN OR;  Surgeon: [name], MD;  Location: [place];  Service: ENT;  Laterality: N/A;   [place] STUDY  12/23/2011   Procedure: GIVENS CAPSULE STUDY;  Surgeon: [name], MD;  [facility];  Service: Endoscopy;  Laterality: N/A;   HOT HEMOSTASIS N/A 09/04/2020   Procedure: HOT HEMOSTASIS (ARGON PLASMA COAGULATION/BICAP);  Surgeon: Mauri Pole, MD;  Location[facility];  Service: Endoscopy;  Laterality: N/A;   HOT HEMOSTASIS N/A 09/30/2021   Procedure: HOT HEMOSTASIS (ARGON PLASMA COAGULATION/BICAP);  Surgeon: Yetta Flock, MD;  Location[facility];  Service: Gastroenterology;  Laterality: N/A;   MICROLARYNGOSCOPY WITH LASER N/A 10/16/2016   Procedure: MICRODIRECTLARYNGOSCOPY WITH LASER ABLATION AND KENLOG INJECTION;  Surgeon: [name], MD;  Location: [place];  Service: ENT;  Laterality: N/A;   [place], most recent gen change by [facility] 4/12   POLYPECTOMY  09/04/2020   Procedure: POLYPECTOMY;  Surgeon: Mauri Pole, MD;  [facility];  Service: Endoscopy;;    SUBMUCOSAL TATTOO INJECTION  09/30/2021   Procedure: SUBMUCOSAL TATTOO INJECTION;  Surgeon: Yetta Flock, MD;  Location: [facility];  Service: Gastroenterology;;   TRACHEOSTOMY TUBE PLACEMENT N/A 10/29/2016   Procedure: TRACHEOSTOMY;  Surgeon: [name], MD;  Location: [place];  Service: ENT;  Laterality: N/A;   TUBAL LIGATION  04/01/2000     Current Outpatient Medications  Medication Sig Dispense Refill   acetaminophen (TYLENOL) 650 MG CR tablet Take 650 mg by mouth every 8 (eight) hours as needed for pain.      albuterol (PROVENTIL HFA;VENTOLIN HFA) 108 (90 Base) MCG/ACT inhaler Inhale 1-2 puffs into the lungs every 6 (six) hours as needed for wheezing or shortness of breath. 1 Inhaler 6  ALPRAZolam (XANAX) 0.5 MG tablet Take 1 tablet (0.5 mg total) by mouth at bedtime as needed for anxiety. 30 tablet 0   apixaban (ELIQUIS) 2.5 MG TABS tablet Take 1 tablet (2.5 mg total) by mouth 2 (two) times daily. 60 tablet 1   bimatoprost (LUMIGAN) 0.01 % SOLN Place 1 drop into both eyes at bedtime.     brimonidine (ALPHAGAN) 0.2 % ophthalmic solution Place 1 drop into the right eye in the morning and at bedtime.     cetirizine (ZYRTEC) 10 MG tablet Take 10 mg by mouth daily as needed for allergies.      donepezil (ARICEPT) 10 MG tablet Take 10 mg by mouth at bedtime.     Epoetin Alfa-epbx (RETACRIT IJ) Inject 1 Dose as directed once a week.      ferrous sulfate 325 (65 FE) MG tablet TAKE 1 TABLET BY MOUTH TWICE DAILY WITH MEALS TO KEEP BLOOD COUNT UP (Patient taking differently: Take 325 mg by mouth daily.) 60 tablet 11   ipratropium-albuterol (DUONEB) 0.5-2.5 (3) MG/3ML SOLN USE 3 ML VIA NEBULIZER EVERY 6 HOURS 360 mL 0   metoprolol succinate (TOPROL XL) 25 MG 24 hr tablet Take 0.5 tablets (12.5 mg total) by mouth daily. 45 tablet 1   mirtazapine (REMERON) 7.5 MG tablet Take 7.5 mg by mouth at bedtime.     nitroGLYCERIN (NITROSTAT) 0.4 MG SL tablet Place 1 tablet (0.4 mg total) under the  tongue every 5 (five) minutes as needed for chest pain. 25 tablet 1   oxymetazoline (AFRIN) 0.05 % nasal spray Place 1 spray into both nostrils 2 (two) times daily as needed for congestion.     potassium chloride SA (KLOR-CON M) 20 MEQ tablet Take 20 mEq by mouth daily.     predniSONE (DELTASONE) 5 MG tablet Take 5 mg by mouth daily.     rosuvastatin (CRESTOR) 40 MG tablet Take 1 tablet (40 mg total) by mouth at bedtime. 90 tablet 3   sodium chloride (OCEAN) 0.65 % SOLN nasal spray Place 1 spray into both nostrils daily as needed for congestion.      STIMULANT LAXATIVE 8.6-50 MG tablet Take 1 tablet by mouth at bedtime.     furosemide (LASIX) 20 MG tablet Take 1 tablet (20 mg total) by mouth daily as needed (FOR SWELLING OR SHORTNESS OF BREATH). 30 tablet 1   No current facility-administered medications for this visit.    Allergies:   Ace inhibitors, Other, and Propofol    Social History:  The patient  reports that she quit smoking about 11 years ago. Her smoking use included cigarettes. She has a 5.00 pack-year smoking history. She has never used smokeless tobacco. She reports that she does not drink alcohol and does not use drugs.   Family History:  The patient's family history includes Alcohol abuse in her brother and father; Aneurysm in her mother; CVA in her mother; Cancer in her brother; Colon cancer in her brother; Diabetes in her brother and sister; Heart attack in her brother, brother, and father; Heart disease in her daughter, mother, and sister; Hypertension in her brother, father, mother, and sister; Stroke in her father and mother.    ROS:  Please see the history of present illness. (+) Congestion (+) Fatigue (+) Bilateral ankle edema (+) Dizziness All other systems are reviewed and negative.    PHYSICAL EXAM: VS:  BP 123/76 (BP Location: Right Arm, Patient Position: Sitting, Cuff Size: Normal)   Pulse 69   Ht 5'  4" (1.626 m)   Wt 131 lb 14.4 oz (59.8 kg)   SpO2 99%    BMI 22.64 kg/m  , BMI Body mass index is 22.64 kg/m. GENERAL:  Well-appearing HEENT:  Pupils equal round and reactive, fundi not visualized, oral mucosa unremarkable.   NECK:  No jugular venous distention, waveform within normal limits, carotid upstroke brisk and symmetric, no bruits.  Trach in place. LUNGS:  Clear to auscultation bilaterally HEART:  RRR.  PMI not displaced or sustained,S1 and S2 within normal limits, no S3, no S4, no clicks, no rubs, II/VI holosystolic murmur at the apex ABD:  Flat, positive bowel sounds normal in frequency in pitch, no bruits, no rebound, no guarding, no midline pulsatile mass, no hepatomegaly, no splenomegaly EXT:  2 plus pulses throughout, Trace ankle edema bilaterally, no cyanosis no clubbing SKIN:  No rashes no nodules NEURO:  Cranial nerves II through XII grossly intact, motor grossly intact throughout PSYCH:  Cognitively intact, oriented to person place and time  EKG:   EKG is personally reviewed. 09/23/2022:  EKG was not ordered. 09/09/2021: A-sensed, V-paced. Rate 70 bpm. 10/04/2020: EKG was not ordered. 01/25/16: ventricularly paced at 70 bpm. Likely atrial fibrillation.   Myocardial Amyloid Imaging  11/20/2021:   By semi-quantitative assessment scan is consistent with no increased heart uptake-Grade 0. Heart to contralateral lung ratio is between 1-1.5, indeterminate for amyloid.   Study is equivocal for TTR amyloidosis (visual score of 1/ratio between 1-1.5).   Prior study not available for comparison.   Ratio of 1.2 indeterminate but Rib uptake is very much more than myocardial uptake And study not likely representative of amyloid   Echo  09/25/2021:  1. Speckled appearance of the myocardium, concerning for possible  infiltrative cardiomyopathy such as amyloid (although patient carries  diagnosis of HCM) - no evidence for increased LVOT gradient.. Left  ventricular ejection fraction, by estimation, is  40 to 45%. The left ventricle has  mildly decreased function. The left  ventricle demonstrates global hypokinesis. There is severe left  ventricular hypertrophy. Left ventricular diastolic parameters are  consistent with Grade II diastolic dysfunction  (pseudonormalization). Elevated left ventricular end-diastolic pressure.  The E/e' is 22.   2. Abnormal RV free wall strain at -7.9%. RV myocardium is speckled.  Right ventricular systolic function is mildly reduced. The right  ventricular size is normal. Mildly increased right ventricular wall  thickness. There is moderately elevated pulmonary  artery systolic pressure. The estimated right ventricular systolic  pressure is 25.4 mmHg.   3. Left atrial size was massively dilated.   4. The pericardial effusion is posterior to the left ventricle.   5. The mitral valve is abnormal. Mild to moderate mitral valve  regurgitation. Moderate mitral annular calcification.   6. Tricuspid valve regurgitation is mild to moderate.   7. The aortic valve is tricuspid. Aortic valve regurgitation is not  visualized. Mild aortic valve sclerosis is present, with no evidence of  aortic valve stenosis.   8. Aortic dilatation noted. There is mild dilatation of the ascending  aorta, measuring 38 mm.   9. The inferior vena cava is dilated in size with >50% respiratory  variability, suggesting right atrial pressure of 8 mmHg.   Comparison(s): Changes from prior study are noted. 05/09/2019: LVEF 65%.   Conclusion(s)/Recommendation(s): Findings concerning for infiltrative vs  hypertrophic cardiomyopathy, would recommend Cardiac MRI for  clarification.   Echo 05/09/19:  1. The left ventricle has hyperdynamic systolic function, with an ejection fraction of >65%.  The cavity size was normal. There is severely increased left ventricular wall thickness. Findings are consistent with hypertrophic cardiomyopathy. Left  ventricular diastolic Doppler parameters are consistent with pseudonormalization.  2. The  right ventricle has normal systolic function. The cavity was normal. There is no increase in right ventricular wall thickness.  3. Left atrial size was moderately dilated.  4. Right atrial size was moderately dilated.  5. Mitral valve regurgitation is moderate by color flow Doppler.  6. The aortic valve is tricuspid. Mild thickening of the aortic valve. Mild calcification of the aortic valve.  ICD Interrogation 01/25/16: St. Just single chamber pacemaker Battery 2.78 V Presenting rhythm is atrial fibrillation Intermittently paced at 70 bpm. Underlying rhythm atrial fibrillation with complete heart block Threshold: 1V @ 0.70m Sensing: not performed due to complete heart block. Impedance: 436 Ohms  No events noted  Echo 11/26/15: Study Conclusions  - Left ventricle: The cavity size was normal. There was severe   concentric hypertrophy. Systolic function was vigorous. The   estimated ejection fraction was in the range of 65% to 70%. Wall   motion was normal; there were no regional wall motion   abnormalities. Features are consistent with a pseudonormal left   ventricular filling pattern, with concomitant abnormal relaxation   and increased filling pressure (grade 2 diastolic dysfunction). - Aortic valve: Trileaflet; normal thickness, mildly calcified   leaflets. - Mitral valve: There was trivial regurgitation. - Left atrium: The atrium was mildly dilated. - Atrial septum: There was increased thickness of the septum,   consistent with lipomatous hypertrophy. - Tricuspid valve: There was trivial regurgitation.   Recent Labs: 08/11/2022: B Natriuretic Peptide 272.6 09/09/2022: BUN 25; Creatinine, Ser 3.70; Hemoglobin 11.9; Potassium 5.1; Sodium 142    Lipid Panel    Component Value Date/Time   CHOL 208 (H) 09/18/2021 1154   TRIG 148 09/18/2021 1154   HDL 54 09/18/2021 1154   CHOLHDL 3.9 09/18/2021 1154   CHOLHDL 2.3 05/09/2019 0402   VLDL 20 05/09/2019 0402   LDLCALC 128 (H)  09/18/2021 1154   LDLDIRECT 161.8 02/21/2013 1416      Wt Readings from Last 3 Encounters:  09/23/22 131 lb 14.4 oz (59.8 kg)  09/09/22 115 lb (52.2 kg)  08/11/22 114 lb (51.7 kg)      ASSESSMENT AND PLAN:  Acute on chronic congestive heart failure ([place] She has mild ankle edema.  Furosemide was stopped a couple weeks ago.  Her weight has increased by 15 pounds.  She notes that her appetite has been better and I think most of it is due to this.  However she does have some signs of volume overload.  We will give her furosemide 20 mg to take just as needed for edema and shortness of breath.  AVM (arteriovenous malformation) of small bowel, acquired History of small bowel AVMs.  She is stable on low-dose Eliquis.  Essential hypertension Blood pressure is very well controlled.  She is only on metoprolol.  Her LVH is not due to hypertensive cardiomyopathy.  Hypertrophic cardiomyopathy (HCC) Hypertrophic cardiomyopathy versus alterative cardiomyopathy.  Cardiac MRI was unrevealing.  She saw Dr. MAundra Dubinand genetic testing is pending.  Permanent atrial fibrillation ([place] She remains in atrial fibrillation.  Rates are controlled.  Continue metoprolol and Eliquis.  She is asymptomatic.  CKD (chronic kidney disease) stage 4, GFR 15-29 ml/min (HCC) She had a rapid deterioration in her renal function over the last year.  She sees Dr. BCarolin Sicks  Testing was negative for  ATTR amyloidosis.  She recently had an AV fistula placed.  Furosemide was stopped but we will add an as needed dose due to lower extremity edema as above.  Continue to avoid other nephrotoxins.  No acute HD indications at this time.   Current medicines are reviewed at length with the patient today.  The patient does not have concerns regarding medicines.  The following changes have been made:  none  Labs/ tests ordered today include:   No orders of the defined types were placed in this encounter.   Disposition: FU with  Adelae Yodice C. Oval Linsey, MD, [facility] in 6 months  I,Mathew Stumpf,acting as a Education administrator for Skeet Latch, MD.,have documented all relevant documentation on the behalf of Skeet Latch, MD,as directed by  Skeet Latch, MD while in the presence of Skeet Latch, MD.  I, Conway Oval Linsey, MD have reviewed all documentation for this visit.  The documentation of the exam, diagnosis, procedures, and orders on 09/23/2022 are all accurate and complete.  Signed, Gracemarie Skeet C. Oval Linsey, MD, [facility]  09/23/2022 1:46 PM    [facility]

## 2022-09-23 NOTE — Assessment & Plan Note (Signed)
She remains in atrial fibrillation.  Rates are controlled.  Continue metoprolol and Eliquis.  She is asymptomatic.

## 2022-09-23 NOTE — Assessment & Plan Note (Signed)
She has mild ankle edema.  Furosemide was stopped a couple weeks ago.  Her weight has increased by 15 pounds.  She notes that her appetite has been better and I think most of it is due to this.  However she does have some signs of volume overload.  We will give her furosemide 20 mg to take just as needed for edema and shortness of breath.

## 2022-09-23 NOTE — Assessment & Plan Note (Signed)
Hypertrophic cardiomyopathy versus alterative cardiomyopathy.  Cardiac MRI was unrevealing.  She saw Dr. Aundra Dubin and genetic testing is pending.

## 2022-09-23 NOTE — Assessment & Plan Note (Signed)
Blood pressure is very well controlled.  She is only on metoprolol.  Her LVH is not due to hypertensive cardiomyopathy.

## 2022-09-23 NOTE — Patient Instructions (Addendum)
Medication Instructions:  TAKE FUROSEMIDE 20 MG AS NEEDED FOR SWELLING OR SHORTNESS OF BREATH    *If you need a refill on your cardiac medications before your next appointment, please call your pharmacy*  Lab Work: NONE  Testing/Procedures: NONE  Follow-Up: At [facility], you and your health needs are our priority.  As part of our continuing mission to provide you with exceptional heart care, we have created designated Provider Care Teams.  These Care Teams include your primary Cardiologist (physician) and Advanced Practice Providers (APPs -  Physician Assistants and Nurse Practitioners) who all work together to provide you with the care you need, when you need it.  We recommend signing up for the patient portal called "MyChart".  Sign up information is provided on this After Visit Summary.  MyChart is used to connect with patients for Virtual Visits (Telemedicine).  Patients are able to view lab/test results, encounter notes, upcoming appointments, etc.  Non-urgent messages can be sent to your provider as well.   To learn more about what you can do with MyChart, go to [email or web address].    Your next appointment:   6 month(s)  The format for your next appointment:   In Person  Provider:   [facility]

## 2022-10-10 ENCOUNTER — Ambulatory Visit (HOSPITAL_COMMUNITY)
Admission: RE | Admit: 2022-10-10 | Discharge: 2022-10-10 | Disposition: A | Discharge status: Home / Self Care | Payer: Medicare (Managed Care) | Source: Ambulatory Visit | Attending: Cardiology | Admitting: Cardiology

## 2022-10-10 VITALS — BP 118/78 | HR 70 | Wt 131.0 lb

## 2022-10-10 DIAGNOSIS — N184 Chronic kidney disease, stage 4 (severe): Secondary | ICD-10-CM | POA: Insufficient documentation

## 2022-10-10 DIAGNOSIS — Z7901 Long term (current) use of anticoagulants: Secondary | ICD-10-CM | POA: Insufficient documentation

## 2022-10-10 DIAGNOSIS — Z8673 Personal history of transient ischemic attack (TIA), and cerebral infarction without residual deficits: Secondary | ICD-10-CM | POA: Insufficient documentation

## 2022-10-10 DIAGNOSIS — I5022 Chronic systolic (congestive) heart failure: Secondary | ICD-10-CM | POA: Diagnosis present

## 2022-10-10 DIAGNOSIS — Z930 Tracheostomy status: Secondary | ICD-10-CM | POA: Diagnosis not present

## 2022-10-10 DIAGNOSIS — I4821 Permanent atrial fibrillation: Secondary | ICD-10-CM | POA: Insufficient documentation

## 2022-10-10 DIAGNOSIS — I422 Other hypertrophic cardiomyopathy: Secondary | ICD-10-CM | POA: Diagnosis not present

## 2022-10-10 DIAGNOSIS — I442 Atrioventricular block, complete: Secondary | ICD-10-CM | POA: Insufficient documentation

## 2022-10-10 LAB — BASIC METABOLIC PANEL
Anion gap: 3 — ABNORMAL LOW (ref 5–15)
BUN: 29 mg/dL — ABNORMAL HIGH (ref 8–23)
CO2: 24 mmol/L (ref 22–32)
Calcium: 9 mg/dL (ref 8.9–10.3)
Chloride: 117 mmol/L — ABNORMAL HIGH (ref 98–111)
Creatinine, Ser: 2.87 mg/dL — ABNORMAL HIGH (ref 0.44–1.00)
GFR, Estimated: 16 mL/min — ABNORMAL LOW (ref >60)
Glucose, Bld: 110 mg/dL — ABNORMAL HIGH (ref 70–99)
Potassium: 4.5 mmol/L (ref 3.5–5.1)
Sodium: 144 mmol/L (ref 135–145)

## 2022-10-10 LAB — BRAIN NATRIURETIC PEPTIDE: B Natriuretic Peptide: 848.5 pg/mL — ABNORMAL HIGH (ref 0.0–100.0)

## 2022-10-10 MED ORDER — TORSEMIDE 20 MG PO TABS: 40.0000 mg | ORAL_TABLET | Freq: Every day | ORAL | 6 refills | Status: DC

## 2022-10-10 NOTE — Patient Instructions (Signed)
INCREASE Torsemide to 40 mg daily Labs today We will only contact you if something comes back abnormal or we need to make some changes. Otherwise no news is good news!  Labs needed in 10 days  You have been referred to [facility] with Dr Broadus John -they will be in contact with an appointment  Your physician wants you to follow-up in: 2 months with Dr Aundra Dubin and echo. You will receive a reminder letter in the mail two months in advance. If you don't receive a letter, please call our office to schedule the follow-up appointment.  Your physician has requested that you have an echocardiogram. Echocardiography is a painless test that uses sound waves to create images of your heart. It provides your doctor with information about the size and shape of your heart and how well your heart's chambers and valves are working. This procedure takes approximately one hour. There are no restrictions for this procedure. Please do NOT wear cologne, perfume, aftershave, or lotions (deodorant is allowed). Please arrive 15 minutes prior to your appointment time.   Do the following things EVERYDAY: Weigh yourself in the morning before breakfast. Write it down and keep it in a log. Take your medicines as prescribed Eat low salt foods--Limit salt (sodium) to 2000 mg per day.  Stay as active as you can everyday Limit all fluids for the day to less than 2 liters  At the [facility], you and your health needs are our priority. As part of our continuing mission to provide you with exceptional heart care, we have created designated Provider Care Teams. These Care Teams include your primary Cardiologist (physician) and Advanced Practice Providers (APPs- Physician Assistants and Nurse Practitioners) who all work together to provide you with the care you need, when you need it.   You may see any of the following providers on your designated Care Team at your next follow up: Dr Glori Bickers Dr  Loralie Champagne Dr. Roxana Hires, NP Lyda Jester, [place] [facility] [place], [place] Forestine Na, NP Audry Riles, PharmD   Please be sure to bring in all your medications bottles to every appointment.

## 2022-10-12 NOTE — Progress Notes (Signed)
PCP[facility]: [name] HF Cardiology: Dr. Aundra Dubin  76 y.o. with history of HF with mid range EF, permanent atrial fibrillation, CHB with PPM, CVA in 5/20, and tracheal stenosis s/p tracheostomy presents for evaluation of CHF. Patient developed CHB in 1986 and had a St Jude pacemaker placed at that time.  She is pacemaker dependent.  She has CKD stage IV with last creatinine 2.19 from 11/22.  She has permanent atrial fibrillation and is on apixaban.  She has had GI bleeding from AVMs in the colon, small bowel, and stomach.  She has been deemed a poor Watchman candidate due to comorbidities.  Echo in 10/22 showed speckled myocardium, severe concentric LVH, EF 40-45%, mildly decreased RV systolic function with normal RV size, PASP 52 mmHg, mild-moderate MR.  She has had multiple family siblings with pacemakers as well as family members with CHF.  2 nieces had sudden cardiac death. Based on echo with LVH and family history, there was concern for a familial cardiomyopathy of some sort, ?hereditary ATTR cardiac amyloidosis versus nonobstructive hypertrophic cardiomyopathy.  She saw Dr. Broadus John for genetic counseling and [employer] gene testing was recommended.  PYP scan was not suggestive of ATTR cardiac amyloidosis.   After I saw patient in the office the first time, I sent off [employer] gene testing.  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   Patient returns for followup of CHF.  Weight is up considerably, 17 lbs.  She uses a cane when walking.  She has mild dyspnea when she walks longer distances.  No dyspnea walking around her house.  No orthopnea/PND.  No lightheadedness.  No chest pain.  No syncope, occasional palpitations.  She now has an AV fistula.   ECG  (personally reviewed): Atrial fibrillation with V-pacing  Labs (11/22): K 3.8, creatinine 2.19 Labs (10/23): K 4.5, creatinine 3.77, BNP 272  PMH: 1. Complete heart block: St Jude PPM placed 1986.  2. GI bleeding: Colonic, small bowel, gastric AVMs.  - Not candidate for Watchman due to comorbidities.  3. Fe deficiency anemia.  4. Hyperlipidemia 5. CKD stage 4 6. Atrial fibrillation: Permanent 7. Tracheal stenosis: Has tracheostomy. 8. COPD 9. Angioedema with ACEI 10. LHC (9/17) with normal coronaries.  11. CVA (5/20): Expressive aphasia.  12. Chronic HF with mid range EF: Echo in 10/22 with speckled myocardium, severe concentric LVH, EF 40-45%, mildly decreased RV systolic function with normal RV size, PASP 52 mmHg, mild-moderate MR.  - PYP scan (12/22): grade 0, H/CL < 1.5.  Not suggestive of ATTR cardiac amyloidosis.  - PRKAG2 mutation heterozygote: Suspect she has PRKAG2 cardiac disease with hypertrophic cardiomyopathy picture and conduction abnormalities.   SH: Lives with daughter.  Nonsmoker, no ETOH.   FH: 2 nieces with suspected sudden death. Multiple siblings with pacemakers.   ROS: All systems reviewed and negative except as per HPI.   Current Outpatient Medications  Medication Sig Dispense Refill   acetaminophen (TYLENOL) 650 MG CR tablet Take 650 mg by mouth every 8 (eight) hours as needed for pain.      albuterol (PROVENTIL HFA;VENTOLIN HFA) 108 (90 Base) MCG/ACT inhaler Inhale 1-2 puffs into the lungs every 6 (six) hours as needed for wheezing or shortness of breath. 1 Inhaler 6   ALPRAZolam (XANAX) 0.5 MG tablet Take 1 tablet (0.5 mg total) by mouth  at bedtime as needed for anxiety. 30 tablet 0   apixaban (ELIQUIS) 2.5 MG TABS tablet Take 1 tablet (2.5 mg total) by mouth 2 (two) times daily. 60 tablet 1   bimatoprost (LUMIGAN) 0.01 % SOLN Place 1 drop into both eyes at bedtime.     brimonidine (ALPHAGAN) 0.2 % ophthalmic solution Place 1 drop into the right eye in the  morning and at bedtime.     cetirizine (ZYRTEC) 10 MG tablet Take 10 mg by mouth daily as needed for allergies.      donepezil (ARICEPT) 10 MG tablet Take 10 mg by mouth at bedtime.     Epoetin Alfa-epbx (RETACRIT IJ) Inject 1 Dose as directed once a week.      ferrous sulfate 325 (65 FE) MG tablet TAKE 1 TABLET BY MOUTH TWICE DAILY WITH MEALS TO KEEP BLOOD COUNT UP (Patient taking differently: Take 325 mg by mouth daily.) 60 tablet 11   furosemide (LASIX) 20 MG tablet Take 1 tablet (20 mg total) by mouth daily as needed (FOR SWELLING OR SHORTNESS OF BREATH). 30 tablet 1   ipratropium-albuterol (DUONEB) 0.5-2.5 (3) MG/3ML SOLN USE 3 ML VIA NEBULIZER EVERY 6 HOURS 360 mL 0   metoprolol succinate (TOPROL XL) 25 MG 24 hr tablet Take 0.5 tablets (12.5 mg total) by mouth daily. 45 tablet 1   mirtazapine (REMERON) 7.5 MG tablet Take 7.5 mg by mouth at bedtime.     nitroGLYCERIN (NITROSTAT) 0.4 MG SL tablet Place 1 tablet (0.4 mg total) under the tongue every 5 (five) minutes as needed for chest pain. 25 tablet 1   oxymetazoline (AFRIN) 0.05 % nasal spray Place 1 spray into both nostrils 2 (two) times daily as needed for congestion.     potassium chloride SA (KLOR-CON M) 20 MEQ tablet Take 20 mEq by mouth daily.     predniSONE (DELTASONE) 5 MG tablet Take 5 mg by mouth daily.     rosuvastatin (CRESTOR) 40 MG tablet Take 1 tablet (40 mg total) by mouth at bedtime. 90 tablet 3   sodium chloride (OCEAN) 0.65 % SOLN nasal spray Place 1 spray into both nostrils daily as needed for congestion.      STIMULANT LAXATIVE 8.6-50 MG tablet Take 1 tablet by mouth at bedtime.     torsemide (DEMADEX) 20 MG tablet Take 2 tablets (40 mg total) by mouth daily. 60 tablet 6   No current facility-administered medications for this encounter.   BP 118/78   Pulse 70   Wt 59.4 kg (131 lb)   SpO2 99%   BMI 22.49 kg/m  General: NAD Neck: JVP 9-10 cm, no thyromegaly or thyroid nodule.  Lungs: Clear to auscultation  bilaterally with normal respiratory effort. CV: Nondisplaced PMI.  Heart regular S1/S2, no S3/S4, no murmur.  No peripheral edema.  No carotid bruit.  Normal pedal pulses.  Abdomen: Soft, nontender, no hepatosplenomegaly, no distention.  Skin: Intact without lesions or rashes.  Neurologic: Alert and oriented x 3.  Psych: Normal affect. Extremities: No clubbing or cyanosis.  HEENT: Normal.   Assessment/Plan: 1. Chronic HF with mid-range EF: Echo in 10/22 showed speckled myocardium, severe concentric LVH, EF 40-45%, mildly decreased RV systolic function with normal RV size, PASP 52 mmHg, mild-moderate MR.  PYP scan was not suggestive of ATTR cardiac amyloidosis. She had 2 nieces with SCD and several siblings with pacemakers and CHF. Based on gene testing, it appears that she has PRKAG2 cardiac disease, this causes glycogen accumulation in cardiac tissue  leading to a hypertrophic cardiomypathy-type picture and conduction abnormalities.   Volume overloaded on exam today with NYHA class II-III symptoms.  This is complicated by CKD stage IV.  GDMT titration limited by CKD stage 4 and soft BP.  - Can continue Toprol XL 12.5 daily.  - Increase torsemide to 40 mg daily with BMET/BNP today and in 10 days.   - She cannot get a cardiac MRI due to her type of pacemaker.  - I will refer her back to Dr. Lattie Corns to discuss her genetic abnormality and for consideration of family screening.  - I will have her wear a 7 day Zio monitor to assess for PVCs, ventricular arrhythmias.  - Repeat echo at followup.  2. CKD stage IV: Check BMET today.  3. H/o tracheal stenosis: Has tracheostomy.  4. Complete heart block: St Jude PPM appears to function normally.  5. Atrial fibrillation: Chronic.  She has history of CVA.  No overt GI bleeding.  - Continue apixaban 2.5 mg bid.  6. GI bleeding: From AVMs.  Has been deemed poor Watchman candidate.   Followup 2 months with echo.   Loralie Champagne 10/12/2022

## 2022-10-22 ENCOUNTER — Ambulatory Visit: Payer: Medicare (Managed Care) | Attending: Cardiology

## 2022-10-22 DIAGNOSIS — I442 Atrioventricular block, complete: Secondary | ICD-10-CM | POA: Diagnosis not present

## 2022-10-22 DIAGNOSIS — Z950 Presence of cardiac pacemaker: Secondary | ICD-10-CM

## 2022-10-22 LAB — CUP PACEART INCLINIC DEVICE CHECK
Battery Impedance: 3500 Ohm
Battery Remaining Longevity: 33 mo
Battery Voltage: 2.75 V
Brady Statistic RV Percent Paced: 100 %
Date Time Interrogation Session: 20231108141659
Implantable Lead Connection Status: 753985
Implantable Lead Connection Status: 753985
Implantable Lead Implant Date: 19860725
Implantable Lead Implant Date: 19860725
Implantable Lead Location: 753859
Implantable Lead Location: 753860
Implantable Lead Serial Number: 652511
Implantable Pulse Generator Implant Date: 20120424
Lead Channel Impedance Value: 303 Ohm
Lead Channel Pacing Threshold Amplitude: 0.75 V
Lead Channel Pacing Threshold Pulse Width: 0.5 ms
Lead Channel Setting Pacing Amplitude: 2.5 V
Lead Channel Setting Pacing Pulse Width: 0.5 ms
Lead Channel Setting Sensing Sensitivity: 8 mV
Pulse Gen Serial Number: 2406848

## 2022-10-22 NOTE — Progress Notes (Signed)
Pacemaker check in clinic. Normal device function. Thresholds, sensing, impedances consistent with previous measurements. Device programmed to maximize longevity. No high ventricular rates noted. Device programmed at appropriate safety margins. Histogram distribution appropriate for patient activity level. Device programmed to optimize intrinsic conduction. Estimated longevity 2.75 years  ROV with [facility] 03/2023

## 2022-10-22 NOTE — Patient Instructions (Signed)
Pacemaker check in clinic. Normal device function. Threshold and impedances consistent with previous measurements. Device programmed to maximize longevity. No high ventricular rates noted. Device programmed at appropriate safety margins. Histogram distribution appropriate for patient activity level. Device programmed to optimize intrinsic conduction. Estimated longevity 2.75 years  ROV with [facility] 03/2023

## 2022-10-23 ENCOUNTER — Ambulatory Visit (INDEPENDENT_AMBULATORY_CARE_PROVIDER_SITE_OTHER): Payer: Medicare (Managed Care) | Admitting: Vascular Surgery

## 2022-10-23 ENCOUNTER — Encounter: Payer: Self-pay | Admitting: Vascular Surgery

## 2022-10-23 VITALS — BP 129/83 | HR 69 | Temp 97.9°F | Resp 20 | Ht 64.0 in | Wt 122.0 lb

## 2022-10-23 DIAGNOSIS — N184 Chronic kidney disease, stage 4 (severe): Secondary | ICD-10-CM

## 2022-10-23 DIAGNOSIS — I129 Hypertensive chronic kidney disease with stage 1 through stage 4 chronic kidney disease, or unspecified chronic kidney disease: Secondary | ICD-10-CM

## 2022-10-23 NOTE — Progress Notes (Signed)
Patient name: [name] MRN: [id number] DOB: [birth year] Sex: female  REASON FOR VISIT:   Follow-up after AV fistula  HPI:   [name] is a pleasant 76 y.o. female who presented for placement of an AV fistula.  She was not yet on dialysis.  If we were not able to place a fistula we were asked to not place an AV graft.  On 09/09/2022 she was taken to the operating room.  She was found to have a high bifurcation of her brachial artery.  She underwent a left ulnar cephalic fistula in the upper arm.  She comes in for a follow-up visit.  Today she has no specific complaints.  She has been using an exercise ball with her left hand to try to help the fistula mature.  Current Outpatient Medications  Medication Sig Dispense Refill   acetaminophen (TYLENOL) 650 MG CR tablet Take 650 mg by mouth every 8 (eight) hours as needed for pain.      albuterol (PROVENTIL HFA;VENTOLIN HFA) 108 (90 Base) MCG/ACT inhaler Inhale 1-2 puffs into the lungs every 6 (six) hours as needed for wheezing or shortness of breath. 1 Inhaler 6   ALPRAZolam (XANAX) 0.5 MG tablet Take 1 tablet (0.5 mg total) by mouth at bedtime as needed for anxiety. 30 tablet 0   apixaban (ELIQUIS) 2.5 MG TABS tablet Take 1 tablet (2.5 mg total) by mouth 2 (two) times daily. 60 tablet 1   bimatoprost (LUMIGAN) 0.01 % SOLN Place 1 drop into both eyes at bedtime.     brimonidine (ALPHAGAN) 0.2 % ophthalmic solution Place 1 drop into the right eye in the morning and at bedtime.     cetirizine (ZYRTEC) 10 MG tablet Take 10 mg by mouth daily as needed for allergies.      donepezil (ARICEPT) 10 MG tablet Take 10 mg by mouth at bedtime.     Epoetin Alfa-epbx (RETACRIT IJ) Inject 1 Dose as directed once a week.      ferrous sulfate 325 (65 FE) MG tablet TAKE 1 TABLET BY MOUTH TWICE DAILY WITH MEALS TO KEEP BLOOD COUNT UP (Patient taking differently: Take 325 mg by mouth daily.) 60 tablet 11   furosemide (LASIX) 20 MG tablet Take 1 tablet  (20 mg total) by mouth daily as needed (FOR SWELLING OR SHORTNESS OF BREATH). 30 tablet 1   ipratropium-albuterol (DUONEB) 0.5-2.5 (3) MG/3ML SOLN USE 3 ML VIA NEBULIZER EVERY 6 HOURS 360 mL 0   metoprolol succinate (TOPROL XL) 25 MG 24 hr tablet Take 0.5 tablets (12.5 mg total) by mouth daily. 45 tablet 1   mirtazapine (REMERON) 7.5 MG tablet Take 7.5 mg by mouth at bedtime.     nitroGLYCERIN (NITROSTAT) 0.4 MG SL tablet Place 1 tablet (0.4 mg total) under the tongue every 5 (five) minutes as needed for chest pain. 25 tablet 1   oxymetazoline (AFRIN) 0.05 % nasal spray Place 1 spray into both nostrils 2 (two) times daily as needed for congestion.     potassium chloride SA (KLOR-CON M) 20 MEQ tablet Take 20 mEq by mouth daily.     predniSONE (DELTASONE) 5 MG tablet Take 5 mg by mouth daily.     rosuvastatin (CRESTOR) 40 MG tablet Take 1 tablet (40 mg total) by mouth at bedtime. 90 tablet 3   sodium chloride (OCEAN) 0.65 % SOLN nasal spray Place 1 spray into both nostrils daily as needed for congestion.      STIMULANT LAXATIVE 8.6-50 MG tablet Take  1 tablet by mouth at bedtime.     torsemide (DEMADEX) 20 MG tablet Take 2 tablets (40 mg total) by mouth daily. 60 tablet 6   No current facility-administered medications for this visit.    REVIEW OF SYSTEMS:  '[X]'$  denotes positive finding, '[ ]'$  denotes negative finding Vascular    Leg swelling    Cardiac    Chest pain or chest pressure:    Shortness of breath upon exertion:    Short of breath when lying flat:    Irregular heart rhythm:    Constitutional    Fever or chills:     PHYSICAL EXAM:   Vitals:   10/23/22 1428  BP: 129/83  Pulse: 69  Resp: 20  Temp: 97.9 F (36.6 C)  SpO2: 97%  Weight: 122 lb (55.3 kg)  Height: '5\' 4"'$  (1.626 m)    GENERAL: The patient is a well-nourished female, in no acute distress. The vital signs are documented above. CARDIOVASCULAR: There is a regular rate and rhythm. PULMONARY: There is good air  exchange bilaterally without wheezing or rales. VASCULAR: She does have a palpable thrill although it is weak.  She has an audible bruit in her fistula.  The vein is small.  She has a palpable left radial pulse.  DATA:   No new data  MEDICAL ISSUES:   S/P LEFT AV FISTULA: This patient is not yet on dialysis.  We were asked to place a fistula.  If this were not possible we were asked to wait and not place an AV graft.  She was found to have a high bifurcation of the brachial artery.  She had an upper arm left ulnar cephalic fistula.  The vein is not maturing well but is patent.  We will bring her back in 6 weeks with an ultrasound.  Given that they do not want [place] to proceed with placement of a graft we can certainly give this fistula as much time as possible to see if this ultimately matures.  I will see her back in 6 weeks.  Deitra Mayo [facility] [place] [phone]

## 2022-10-24 ENCOUNTER — Ambulatory Visit (HOSPITAL_COMMUNITY)
Admission: RE | Admit: 2022-10-24 | Discharge: 2022-10-24 | Disposition: A | Discharge status: Home / Self Care | Payer: Medicare (Managed Care) | Source: Ambulatory Visit | Attending: Cardiology | Admitting: Cardiology

## 2022-10-24 DIAGNOSIS — I422 Other hypertrophic cardiomyopathy: Secondary | ICD-10-CM | POA: Diagnosis not present

## 2022-10-24 DIAGNOSIS — I5022 Chronic systolic (congestive) heart failure: Secondary | ICD-10-CM | POA: Insufficient documentation

## 2022-10-24 LAB — BASIC METABOLIC PANEL
Anion gap: 13 (ref 5–15)
BUN: 52 mg/dL — ABNORMAL HIGH (ref 8–23)
CO2: 27 mmol/L (ref 22–32)
Calcium: 9.8 mg/dL (ref 8.9–10.3)
Chloride: 103 mmol/L (ref 98–111)
Creatinine, Ser: 3.17 mg/dL — ABNORMAL HIGH (ref 0.44–1.00)
GFR, Estimated: 14 mL/min — ABNORMAL LOW (ref >60)
Glucose, Bld: 104 mg/dL — ABNORMAL HIGH (ref 70–99)
Potassium: 4.4 mmol/L (ref 3.5–5.1)
Sodium: 143 mmol/L (ref 135–145)

## 2022-11-20 ENCOUNTER — Other Ambulatory Visit: Payer: Self-pay | Admitting: *Deleted

## 2022-11-20 DIAGNOSIS — I129 Hypertensive chronic kidney disease with stage 1 through stage 4 chronic kidney disease, or unspecified chronic kidney disease: Secondary | ICD-10-CM

## 2022-11-27 ENCOUNTER — Ambulatory Visit (INDEPENDENT_AMBULATORY_CARE_PROVIDER_SITE_OTHER): Payer: Medicare (Managed Care) | Admitting: Vascular Surgery

## 2022-11-27 ENCOUNTER — Encounter: Payer: Self-pay | Admitting: Vascular Surgery

## 2022-11-27 ENCOUNTER — Ambulatory Visit (HOSPITAL_COMMUNITY)
Admission: RE | Admit: 2022-11-27 | Discharge: 2022-11-27 | Disposition: A | Discharge status: Home / Self Care | Payer: Medicare (Managed Care) | Source: Ambulatory Visit | Attending: Vascular Surgery | Admitting: Vascular Surgery

## 2022-11-27 VITALS — BP 122/77 | HR 69 | Temp 98.2°F | Resp 20 | Ht 64.0 in | Wt 120.0 lb

## 2022-11-27 DIAGNOSIS — N184 Chronic kidney disease, stage 4 (severe): Secondary | ICD-10-CM

## 2022-11-27 DIAGNOSIS — I129 Hypertensive chronic kidney disease with stage 1 through stage 4 chronic kidney disease, or unspecified chronic kidney disease: Secondary | ICD-10-CM | POA: Diagnosis present

## 2022-11-27 NOTE — Progress Notes (Signed)
Patient name: [name] MRN: [id number] DOB: 01-14-1946 Sex: female  REASON FOR VISIT:   Follow-up after AV fistula.  HPI:   [name] is a pleasant 76 y.o. female who was referred for evaluation for an AV fistula.  We were asked not to place a graft if a fistula were not possible.  She was taken to the operating room on 09/09/2022.  She was found to have a high bifurcation of her brachial artery.  She underwent a left ulnar cephalic fistula in the upper arm.  She comes in for 6-week follow-up visit.  She has no specific complaints.  She is not on dialysis.  Of note she has a pacemaker on the right side.  She has no pain or paresthesias in her left arm.  Current Outpatient Medications  Medication Sig Dispense Refill   acetaminophen (TYLENOL) 650 MG CR tablet Take 650 mg by mouth every 8 (eight) hours as needed for pain.      albuterol (PROVENTIL HFA;VENTOLIN HFA) 108 (90 Base) MCG/ACT inhaler Inhale 1-2 puffs into the lungs every 6 (six) hours as needed for wheezing or shortness of breath. 1 Inhaler 6   ALPRAZolam (XANAX) 0.5 MG tablet Take 1 tablet (0.5 mg total) by mouth at bedtime as needed for anxiety. 30 tablet 0   apixaban (ELIQUIS) 2.5 MG TABS tablet Take 1 tablet (2.5 mg total) by mouth 2 (two) times daily. 60 tablet 1   bimatoprost (LUMIGAN) 0.01 % SOLN Place 1 drop into both eyes at bedtime.     brimonidine (ALPHAGAN) 0.2 % ophthalmic solution Place 1 drop into the right eye in the morning and at bedtime.     cetirizine (ZYRTEC) 10 MG tablet Take 10 mg by mouth daily as needed for allergies.      donepezil (ARICEPT) 10 MG tablet Take 10 mg by mouth at bedtime.     Epoetin Alfa-epbx (RETACRIT IJ) Inject 1 Dose as directed once a week.      ferrous sulfate 325 (65 FE) MG tablet TAKE 1 TABLET BY MOUTH TWICE DAILY WITH MEALS TO KEEP BLOOD COUNT UP (Patient taking differently: Take 325 mg by mouth daily.) 60 tablet 11   furosemide (LASIX) 20 MG tablet Take 1 tablet (20 mg  total) by mouth daily as needed (FOR SWELLING OR SHORTNESS OF BREATH). 30 tablet 1   ipratropium-albuterol (DUONEB) 0.5-2.5 (3) MG/3ML SOLN USE 3 ML VIA NEBULIZER EVERY 6 HOURS 360 mL 0   metoprolol succinate (TOPROL XL) 25 MG 24 hr tablet Take 0.5 tablets (12.5 mg total) by mouth daily. 45 tablet 1   mirtazapine (REMERON) 7.5 MG tablet Take 7.5 mg by mouth at bedtime.     nitroGLYCERIN (NITROSTAT) 0.4 MG SL tablet Place 1 tablet (0.4 mg total) under the tongue every 5 (five) minutes as needed for chest pain. 25 tablet 1   oxymetazoline (AFRIN) 0.05 % nasal spray Place 1 spray into both nostrils 2 (two) times daily as needed for congestion.     potassium chloride SA (KLOR-CON M) 20 MEQ tablet Take 20 mEq by mouth daily.     predniSONE (DELTASONE) 5 MG tablet Take 5 mg by mouth daily.     rosuvastatin (CRESTOR) 40 MG tablet Take 1 tablet (40 mg total) by mouth at bedtime. 90 tablet 3   sodium chloride (OCEAN) 0.65 % SOLN nasal spray Place 1 spray into both nostrils daily as needed for congestion.      STIMULANT LAXATIVE 8.6-50 MG tablet Take 1 tablet  by mouth at bedtime.     torsemide (DEMADEX) 20 MG tablet Take 2 tablets (40 mg total) by mouth daily. 60 tablet 6   No current facility-administered medications for this visit.    REVIEW OF SYSTEMS:  '[X]'$  denotes positive finding, '[ ]'$  denotes negative finding Vascular    Leg swelling    Cardiac    Chest pain or chest pressure:    Shortness of breath upon exertion:    Short of breath when lying flat:    Irregular heart rhythm:    Constitutional    Fever or chills:     PHYSICAL EXAM:   Vitals:   11/27/22 1543  BP: 122/77  Pulse: 69  Resp: 20  Temp: 98.2 F (36.8 C)  SpO2: 96%  Weight: 120 lb (54.4 kg)  Height: '5\' 4"'$  (1.626 m)    GENERAL: The patient is a well-nourished female, in no acute distress. The vital signs are documented above. CARDIOVASCULAR: There is a regular rate and rhythm. PULMONARY: There is good air exchange  bilaterally without wheezing or rales. VASCULAR: He has a palpable radial pulse bilaterally. Her fistula has an audible bruit but the vein is very small and likely not salvageable.  DATA:   DUPLEX LEFT AV FISTULA: I have independently interpreted her duplex of her left AV fistula today.  The vein is small throughout.  It has diameters ranging from 1.6-3.6 mm.  There is stenosis noted at the antecubitum.  MEDICAL ISSUES:   STAGE IV CHRONIC KIDNEY DISEASE: We have been asked to place an AV fistula.  If the fistula was not possible we were asked to wait before placing an AV graft.  Based on her vein map her only option for a fistula was to use the cephalic vein in the left arm.  However she had a high bifurcation of the brachial artery on the left and the artery was very small.  For this reason this fistula is not maturing adequately.  I think the vein is too small even to consider a fistulogram.  On the right side she does not have a high bifurcation but has a pacemaker on the right.  Therefore, I would be reluctant to consider access in the right arm.  If she does ultimately require placement of a graft I think the next best option would be an upper arm loop on the left.  We would certainly try this before considering a thigh graft.  Hopefully she will not need dialysis for some time.  I will see her back as needed.  Deitra Mayo [facility] [place] [phone]

## 2022-12-04 ENCOUNTER — Encounter (HOSPITAL_COMMUNITY): Payer: Medicare (Managed Care)

## 2022-12-04 ENCOUNTER — Ambulatory Visit: Payer: Medicare (Managed Care)

## 2022-12-23 ENCOUNTER — Non-Acute Institutional Stay (HOSPITAL_COMMUNITY)
Admission: RE | Admit: 2022-12-23 | Discharge: 2022-12-23 | Disposition: A | Discharge status: Home / Self Care | Payer: Medicare (Managed Care) | Source: Ambulatory Visit | Attending: Internal Medicine | Admitting: Internal Medicine

## 2022-12-23 DIAGNOSIS — N189 Chronic kidney disease, unspecified: Secondary | ICD-10-CM | POA: Diagnosis not present

## 2022-12-23 DIAGNOSIS — D631 Anemia in chronic kidney disease: Secondary | ICD-10-CM | POA: Insufficient documentation

## 2022-12-23 MED ORDER — SODIUM CHLORIDE 0.9 % IV SOLN
510.0000 mg | Freq: Once | INTRAVENOUS | Status: AC
  Administered 2022-12-23: 510 mg via INTRAVENOUS
  Filled 2022-12-23: qty 510

## 2022-12-23 MED ORDER — SODIUM CHLORIDE 0.9 % IV SOLN: INTRAVENOUS | Status: DC | PRN

## 2022-12-23 NOTE — Progress Notes (Signed)
PATIENT CARE CENTER NOTE  Diagnosis: D63.1 Anemia in chronic kidney disease   Provider: Lawson Radar MD  Procedure: Feraheme 510 mg  Note: Patient received Feraheme infusion (dose # 1 of 2) via PIV. Tolerated infusion well with no adverse reaction. Vital signs stable. Pt observed for 30 minutes post infusion. Discharge instructions given.  Patient alert, oriented and ambulatory with cane at discharge. Pt discharged home with daughter.

## 2022-12-30 ENCOUNTER — Non-Acute Institutional Stay (HOSPITAL_COMMUNITY)
Admission: RE | Admit: 2022-12-30 | Discharge: 2022-12-30 | Disposition: A | Discharge status: Home / Self Care | Payer: Medicare (Managed Care) | Source: Ambulatory Visit | Attending: Internal Medicine | Admitting: Internal Medicine

## 2022-12-30 DIAGNOSIS — N189 Chronic kidney disease, unspecified: Secondary | ICD-10-CM | POA: Insufficient documentation

## 2022-12-30 DIAGNOSIS — D631 Anemia in chronic kidney disease: Secondary | ICD-10-CM | POA: Insufficient documentation

## 2022-12-30 MED ORDER — SODIUM CHLORIDE 0.9 % IV SOLN
510.0000 mg | Freq: Once | INTRAVENOUS | Status: AC
  Administered 2022-12-30: 510 mg via INTRAVENOUS
  Filled 2022-12-30: qty 510

## 2022-12-30 MED ORDER — SODIUM CHLORIDE 0.9 % IV SOLN: INTRAVENOUS | Status: DC | PRN

## 2022-12-30 NOTE — Progress Notes (Signed)
PATIENT CARE CENTER NOTE:  Diagnosis: D63.1 Anemia in CKD  Provider: Lawson Radar MD  Procedure: Shirlean Kelly '510mg'$  infusion    Patient received IV Feraheme ( dose #2 of 2).  No premeds required per orders. Observed for at least 30 minutes post infusion. Tolerated well with no adverse reaction noted, vitals stable, discharge instructions given , verbalized understanding. Patient alert, oriented, and ambulatory with cane at the time of discharge accompanied by daughter.

## 2023-01-30 ENCOUNTER — Encounter: Payer: Medicare (Managed Care) | Admitting: Physician Assistant

## 2023-03-13 ENCOUNTER — Encounter (INDEPENDENT_AMBULATORY_CARE_PROVIDER_SITE_OTHER): Payer: Medicare (Managed Care) | Admitting: Ophthalmology

## 2023-03-13 DIAGNOSIS — H43811 Vitreous degeneration, right eye: Secondary | ICD-10-CM

## 2023-03-13 DIAGNOSIS — H401130 Primary open-angle glaucoma, bilateral, stage unspecified: Secondary | ICD-10-CM

## 2023-03-13 DIAGNOSIS — I10 Essential (primary) hypertension: Secondary | ICD-10-CM

## 2023-03-13 DIAGNOSIS — H35033 Hypertensive retinopathy, bilateral: Secondary | ICD-10-CM

## 2023-03-13 DIAGNOSIS — Z961 Presence of intraocular lens: Secondary | ICD-10-CM

## 2023-04-10 ENCOUNTER — Ambulatory Visit (HOSPITAL_BASED_OUTPATIENT_CLINIC_OR_DEPARTMENT_OTHER): Payer: Medicare (Managed Care) | Admitting: Family

## 2023-04-14 ENCOUNTER — Ambulatory Visit: Payer: Medicare (Managed Care) | Admitting: Cardiology

## 2023-05-21 ENCOUNTER — Encounter: Payer: Self-pay | Admitting: Vascular Surgery

## 2023-05-21 ENCOUNTER — Telehealth: Payer: Self-pay

## 2023-05-21 ENCOUNTER — Ambulatory Visit (INDEPENDENT_AMBULATORY_CARE_PROVIDER_SITE_OTHER): Payer: Medicare (Managed Care) | Admitting: Vascular Surgery

## 2023-05-21 VITALS — BP 112/72 | HR 70 | Temp 98.0°F | Resp 20 | Ht 64.0 in | Wt 118.0 lb

## 2023-05-21 DIAGNOSIS — N185 Chronic kidney disease, stage 5: Secondary | ICD-10-CM | POA: Diagnosis not present

## 2023-05-21 NOTE — Telephone Encounter (Signed)
Attempted to reach patient to scheduled surgery. Left VM for patient to return call.

## 2023-05-21 NOTE — Progress Notes (Signed)
ASSESSMENT & PLAN   STAGE V CHRONIC KIDNEY DISEASE: We have been asked to proceed with placement of an AV graft.  She has a high bifurcation of the brachial artery on the left so this will likely be an upper arm loop graft.  She has a pacemaker on the right side and therefore I am reluctant to place access in the right arm.  Certainly I think it is worth trying an upper arm loop graft on the left before considering a thigh graft.  She is not yet on dialysis.  Her surgeries been scheduled for 06/02/2023.  We have discussed the indications for the procedure and the potential complications and she is agreeable to proceed.  REASON FOR CONSULT:    For placement of AV graft.  The consult is requested by Dr. Ronalee Belts.   HPI:   [name] is a 77 y.o. female who had previously been referred for placement of an AV fistula.  We were asked to not place a graft if the fistula were not possible.  On 09/09/2022 she was taken to the operating room and had a high bifurcation of her brachial artery on the left.  She underwent a left ulnar cephalic fistula in the upper arm.  She was seen in follow-up on 11/27/2022.  At that time the vein was small throughout with diameters ranging from 1.6-3.6 mm. Of note she has a pacemaker on the right side.  Therefore I was reluctant to consider access in the right arm.  I felt that if she ultimately required graft the best option would be a left upper arm loop.  Certainly would be worth trying this before considering a thigh graft.  She comes in today as they would like [place] to proceed with placement of an AV graft.  She has not no recent uremic symptoms.  Past Medical History:  Diagnosis Date   Anemia    Angioedema    2/2 ACE   Arteriovenous malformation of stomach    Arthritis    Asthma    AVM (arteriovenous malformation) of colon    small intestine; stomach   Blood transfusion    CHF (congestive heart failure) (HCC)    CKD (chronic kidney disease) stage 4, GFR  15-29 ml/min (HCC) 09/24/2010   Baseline Cr 1.3-1.6   Complete heart block (HCC)    s/p PPM 1998   Complication of anesthesia    Difficult airway; anaphylaxis and swelling with propofol   DDD (degenerative disc disease)    Depression    Diabetes mellitus, type 2 (HCC)    Diastolic heart failure    Fatty liver 07/26/2010   GERD (gastroesophageal reflux disease)    GI bleed    History of alcohol abuse Stopped Fall 2012   History of tobacco use Quit Fall 2012   Hx of cardiovascular stress test    a. Lexiscan Myoview (10/15):  Small inferolateral and apical defect c/w scar and poss soft tissue attenuation, no ischemia, EF 42%   Hx of colonic polyp 08/13/2010   adenomatous   Hx of colonoscopy    Hyperlipidemia    Hypertension    Hypertrophic cardiomyopathy (HCC)    dx by Dr Juanda Chance 2009   Iron deficiency anemia    Myocardial infarction [facility])    Pacemaker    Panic attacks    Permanent atrial fibrillation (HCC)    Renal failure    baseline creatinine 1.6   Right arm pain 01/08/2012   RLS (restless legs syndrome)  Dx 06/2007   Sleep apnea    no cpap     Family History  Problem Relation Age of Onset   Hypertension Mother    Stroke Mother    Heart disease Mother    Aneurysm Mother    CVA Mother    Hypertension Father    Stroke Father    Heart attack Father    Alcohol abuse Father    Hypertension Sister    Hypertension Brother    Colon cancer Brother    Cancer Brother    Diabetes Sister    Diabetes Brother    Heart attack Brother    Heart attack Brother    Heart disease Sister    Alcohol abuse Brother    Heart disease Daughter    Esophageal cancer Neg Hx    Stomach cancer Neg Hx    Rectal cancer Neg Hx     SOCIAL HISTORY: Social History   Tobacco Use   Smoking status: Former    Packs/day: 0.10    Years: 50.00    Additional pack years: 0.00    Total pack years: 5.00    Types: Cigarettes    Quit date: 08/16/2011    Years since quitting: 11.7   Smokeless  tobacco: Never   Tobacco comments:    quit 2012  Substance Use Topics   Alcohol use: No    Comment: quit 08/16/2011    Allergies  Allergen Reactions   Ace Inhibitors Swelling    angiodema   Other Anaphylaxis and Swelling    Reaction to unspecified anesthesia    Propofol     Dysrhythmia likely to drop in afterload post induction with Propofol. Did not have anaphylactic symptoms with MAC in 2018    Current Outpatient Medications  Medication Sig Dispense Refill   acetaminophen (TYLENOL) 650 MG CR tablet Take 650 mg by mouth every 8 (eight) hours as needed for pain.      albuterol (PROVENTIL HFA;VENTOLIN HFA) 108 (90 Base) MCG/ACT inhaler Inhale 1-2 puffs into the lungs every 6 (six) hours as needed for wheezing or shortness of breath. 1 Inhaler 6   ALPRAZolam (XANAX) 0.5 MG tablet Take 1 tablet (0.5 mg total) by mouth at bedtime as needed for anxiety. 30 tablet 0   apixaban (ELIQUIS) 2.5 MG TABS tablet Take 1 tablet (2.5 mg total) by mouth 2 (two) times daily. 60 tablet 1   bimatoprost (LUMIGAN) 0.01 % SOLN Place 1 drop into both eyes at bedtime.     brimonidine (ALPHAGAN) 0.2 % ophthalmic solution Place 1 drop into the right eye in the morning and at bedtime.     cetirizine (ZYRTEC) 10 MG tablet Take 10 mg by mouth daily as needed for allergies.      donepezil (ARICEPT) 10 MG tablet Take 10 mg by mouth at bedtime.     Epoetin Alfa-epbx (RETACRIT IJ) Inject 1 Dose as directed once a week.      ferrous sulfate 325 (65 FE) MG tablet TAKE 1 TABLET BY MOUTH TWICE DAILY WITH MEALS TO KEEP BLOOD COUNT UP (Patient taking differently: Take 325 mg by mouth daily.) 60 tablet 11   furosemide (LASIX) 20 MG tablet Take 1 tablet (20 mg total) by mouth daily as needed (FOR SWELLING OR SHORTNESS OF BREATH). 30 tablet 1   ipratropium-albuterol (DUONEB) 0.5-2.5 (3) MG/3ML SOLN USE 3 ML VIA NEBULIZER EVERY 6 HOURS 360 mL 0   metoprolol succinate (TOPROL XL) 25 MG 24 hr tablet Take 0.5 tablets (12.5  mg total)  by mouth daily. 45 tablet 1   mirtazapine (REMERON) 7.5 MG tablet Take 7.5 mg by mouth at bedtime.     nitroGLYCERIN (NITROSTAT) 0.4 MG SL tablet Place 1 tablet (0.4 mg total) under the tongue every 5 (five) minutes as needed for chest pain. 25 tablet 1   oxymetazoline (AFRIN) 0.05 % nasal spray Place 1 spray into both nostrils 2 (two) times daily as needed for congestion.     potassium chloride SA (KLOR-CON M) 20 MEQ tablet Take 20 mEq by mouth daily.     predniSONE (DELTASONE) 5 MG tablet Take 5 mg by mouth daily.     rosuvastatin (CRESTOR) 40 MG tablet Take 1 tablet (40 mg total) by mouth at bedtime. 90 tablet 3   sodium chloride (OCEAN) 0.65 % SOLN nasal spray Place 1 spray into both nostrils daily as needed for congestion.      STIMULANT LAXATIVE 8.6-50 MG tablet Take 1 tablet by mouth at bedtime.     torsemide (DEMADEX) 20 MG tablet Take 2 tablets (40 mg total) by mouth daily. 60 tablet 6   No current facility-administered medications for this visit.    REVIEW OF SYSTEMS:  [X]  denotes positive finding, [ ]  denotes negative finding Cardiac  Comments:  Chest pain or chest pressure:    Shortness of breath upon exertion:    Short of breath when lying flat:    Irregular heart rhythm:        Vascular    Pain in calf, thigh, or hip brought on by ambulation:    Pain in feet at night that wakes you up from your sleep:     Blood clot in your veins:    Leg swelling:         Pulmonary    Oxygen at home:    Productive cough:     Wheezing:         Neurologic    Sudden weakness in arms or legs:     Sudden numbness in arms or legs:     Sudden onset of difficulty speaking or slurred speech:    Temporary loss of vision in one eye:     Problems with dizziness:         Gastrointestinal    Blood in stool:     Vomited blood:         Genitourinary    Burning when urinating:     Blood in urine:        Psychiatric    Major depression:         Hematologic    Bleeding problems:     Problems with blood clotting too easily:        Skin    Rashes or ulcers:        Constitutional    Fever or chills:    -  PHYSICAL EXAM:   Vitals:   05/21/23 0842  BP: 112/72  Pulse: 70  Resp: 20  Temp: 98 F (36.7 C)  SpO2: 97%  Weight: 118 lb (53.5 kg)  Height: 5\' 4"  (1.626 m)   Body mass index is 20.25 kg/m. GENERAL: The patient is a well-nourished female, in no acute distress. The vital signs are documented above. CARDIAC: There is a regular rate and rhythm.  VASCULAR: She has palpable radial pulses bilaterally. Her left upper arm fistula is occluded. PULMONARY: There is good air exchange bilaterally without wheezing or rales. MUSCULOSKELETAL: There are no major deformities. NEUROLOGIC: No focal weakness  or paresthesias are detected. SKIN: There are no ulcers or rashes noted. PSYCHIATRIC: The patient has a normal affect.  DATA:    No new data.  [name] [facility]

## 2023-05-21 NOTE — H&P (View-Only) (Signed)
 ASSESSMENT & PLAN   STAGE V CHRONIC KIDNEY DISEASE: We have been asked to proceed with placement of an AV graft.  She has a high bifurcation of the brachial artery on the left so this will likely be an upper arm loop graft.  She has a pacemaker on the right side and therefore I am reluctant to place access in the right arm.  Certainly I think it is worth trying an upper arm loop graft on the left before considering a thigh graft.  She is not yet on dialysis.  Her surgeries been scheduled for 06/02/2023.  We have discussed the indications for the procedure and the potential complications and she is agreeable to proceed.  REASON FOR CONSULT:    For placement of AV graft.  The consult is requested by Dr. Bhandari.   HPI:   [name] is a 77 y.o. female who had previously been referred for placement of an AV fistula.  We were asked to not place a graft if the fistula were not possible.  On 09/09/2022 she was taken to the operating room and had a high bifurcation of her brachial artery on the left.  She underwent a left ulnar cephalic fistula in the upper arm.  She was seen in follow-up on 11/27/2022.  At that time the vein was small throughout with diameters ranging from 1.6-3.6 mm. Of note she has a pacemaker on the right side.  Therefore I was reluctant to consider access in the right arm.  I felt that if she ultimately required graft the best option would be a left upper arm loop.  Certainly would be worth trying this before considering a thigh graft.  She comes in today as they would like us to proceed with placement of an AV graft.  She has not no recent uremic symptoms.  Past Medical History:  Diagnosis Date   Anemia    Angioedema    2/2 ACE   Arteriovenous malformation of stomach    Arthritis    Asthma    AVM (arteriovenous malformation) of colon    small intestine; stomach   Blood transfusion    CHF (congestive heart failure) (HCC)    CKD (chronic kidney disease) stage 4, GFR  15-29 ml/min (HCC) 09/24/2010   Baseline Cr 1.3-1.6   Complete heart block (HCC)    s/p PPM 1998   Complication of anesthesia    Difficult airway; anaphylaxis and swelling with propofol   DDD (degenerative disc disease)    Depression    Diabetes mellitus, type 2 (HCC)    Diastolic heart failure    Fatty liver 07/26/2010   GERD (gastroesophageal reflux disease)    GI bleed    History of alcohol abuse Stopped Fall 2012   History of tobacco use Quit Fall 2012   Hx of cardiovascular stress test    a. Lexiscan Myoview (10/15):  Small inferolateral and apical defect c/w scar and poss soft tissue attenuation, no ischemia, EF 42%   Hx of colonic polyp 08/13/2010   adenomatous   Hx of colonoscopy    Hyperlipidemia    Hypertension    Hypertrophic cardiomyopathy (HCC)    dx by Dr Brodie 2009   Iron deficiency anemia    Myocardial infarction (HCC)    Pacemaker    Panic attacks    Permanent atrial fibrillation (HCC)    Renal failure    baseline creatinine 1.6   Right arm pain 01/08/2012   RLS (restless legs syndrome)      Dx 06/2007   Sleep apnea    no cpap     Family History  Problem Relation Age of Onset   Hypertension Mother    Stroke Mother    Heart disease Mother    Aneurysm Mother    CVA Mother    Hypertension Father    Stroke Father    Heart attack Father    Alcohol abuse Father    Hypertension Sister    Hypertension Brother    Colon cancer Brother    Cancer Brother    Diabetes Sister    Diabetes Brother    Heart attack Brother    Heart attack Brother    Heart disease Sister    Alcohol abuse Brother    Heart disease Daughter    Esophageal cancer Neg Hx    Stomach cancer Neg Hx    Rectal cancer Neg Hx     SOCIAL HISTORY: Social History   Tobacco Use   Smoking status: Former    Packs/day: 0.10    Years: 50.00    Additional pack years: 0.00    Total pack years: 5.00    Types: Cigarettes    Quit date: 08/16/2011    Years since quitting: 11.7   Smokeless  tobacco: Never   Tobacco comments:    quit 2012  Substance Use Topics   Alcohol use: No    Comment: quit 08/16/2011    Allergies  Allergen Reactions   Ace Inhibitors Swelling    angiodema   Other Anaphylaxis and Swelling    Reaction to unspecified anesthesia    Propofol     Dysrhythmia likely to drop in afterload post induction with Propofol. Did not have anaphylactic symptoms with MAC in 2018    Current Outpatient Medications  Medication Sig Dispense Refill   acetaminophen (TYLENOL) 650 MG CR tablet Take 650 mg by mouth every 8 (eight) hours as needed for pain.      albuterol (PROVENTIL HFA;VENTOLIN HFA) 108 (90 Base) MCG/ACT inhaler Inhale 1-2 puffs into the lungs every 6 (six) hours as needed for wheezing or shortness of breath. 1 Inhaler 6   ALPRAZolam (XANAX) 0.5 MG tablet Take 1 tablet (0.5 mg total) by mouth at bedtime as needed for anxiety. 30 tablet 0   apixaban (ELIQUIS) 2.5 MG TABS tablet Take 1 tablet (2.5 mg total) by mouth 2 (two) times daily. 60 tablet 1   bimatoprost (LUMIGAN) 0.01 % SOLN Place 1 drop into both eyes at bedtime.     brimonidine (ALPHAGAN) 0.2 % ophthalmic solution Place 1 drop into the right eye in the morning and at bedtime.     cetirizine (ZYRTEC) 10 MG tablet Take 10 mg by mouth daily as needed for allergies.      donepezil (ARICEPT) 10 MG tablet Take 10 mg by mouth at bedtime.     Epoetin Alfa-epbx (RETACRIT IJ) Inject 1 Dose as directed once a week.      ferrous sulfate 325 (65 FE) MG tablet TAKE 1 TABLET BY MOUTH TWICE DAILY WITH MEALS TO KEEP BLOOD COUNT UP (Patient taking differently: Take 325 mg by mouth daily.) 60 tablet 11   furosemide (LASIX) 20 MG tablet Take 1 tablet (20 mg total) by mouth daily as needed (FOR SWELLING OR SHORTNESS OF BREATH). 30 tablet 1   ipratropium-albuterol (DUONEB) 0.5-2.5 (3) MG/3ML SOLN USE 3 ML VIA NEBULIZER EVERY 6 HOURS 360 mL 0   metoprolol succinate (TOPROL XL) 25 MG 24 hr tablet Take 0.5 tablets (12.5   mg total)  by mouth daily. 45 tablet 1   mirtazapine (REMERON) 7.5 MG tablet Take 7.5 mg by mouth at bedtime.     nitroGLYCERIN (NITROSTAT) 0.4 MG SL tablet Place 1 tablet (0.4 mg total) under the tongue every 5 (five) minutes as needed for chest pain. 25 tablet 1   oxymetazoline (AFRIN) 0.05 % nasal spray Place 1 spray into both nostrils 2 (two) times daily as needed for congestion.     potassium chloride SA (KLOR-CON M) 20 MEQ tablet Take 20 mEq by mouth daily.     predniSONE (DELTASONE) 5 MG tablet Take 5 mg by mouth daily.     rosuvastatin (CRESTOR) 40 MG tablet Take 1 tablet (40 mg total) by mouth at bedtime. 90 tablet 3   sodium chloride (OCEAN) 0.65 % SOLN nasal spray Place 1 spray into both nostrils daily as needed for congestion.      STIMULANT LAXATIVE 8.6-50 MG tablet Take 1 tablet by mouth at bedtime.     torsemide (DEMADEX) 20 MG tablet Take 2 tablets (40 mg total) by mouth daily. 60 tablet 6   No current facility-administered medications for this visit.    REVIEW OF SYSTEMS:  [X] denotes positive finding, [ ] denotes negative finding Cardiac  Comments:  Chest pain or chest pressure:    Shortness of breath upon exertion:    Short of breath when lying flat:    Irregular heart rhythm:        Vascular    Pain in calf, thigh, or hip brought on by ambulation:    Pain in feet at night that wakes you up from your sleep:     Blood clot in your veins:    Leg swelling:         Pulmonary    Oxygen at home:    Productive cough:     Wheezing:         Neurologic    Sudden weakness in arms or legs:     Sudden numbness in arms or legs:     Sudden onset of difficulty speaking or slurred speech:    Temporary loss of vision in one eye:     Problems with dizziness:         Gastrointestinal    Blood in stool:     Vomited blood:         Genitourinary    Burning when urinating:     Blood in urine:        Psychiatric    Major depression:         Hematologic    Bleeding problems:     Problems with blood clotting too easily:        Skin    Rashes or ulcers:        Constitutional    Fever or chills:    -  PHYSICAL EXAM:   Vitals:   05/21/23 0842  BP: 112/72  Pulse: 70  Resp: 20  Temp: 98 F (36.7 C)  SpO2: 97%  Weight: 118 lb (53.5 kg)  Height: 5' 4" (1.626 m)   Body mass index is 20.25 kg/m. GENERAL: The patient is a well-nourished female, in no acute distress. The vital signs are documented above. CARDIAC: There is a regular rate and rhythm.  VASCULAR: She has palpable radial pulses bilaterally. Her left upper arm fistula is occluded. PULMONARY: There is good air exchange bilaterally without wheezing or rales. MUSCULOSKELETAL: There are no major deformities. NEUROLOGIC: No focal weakness   or paresthesias are detected. SKIN: There are no ulcers or rashes noted. PSYCHIATRIC: The patient has a normal affect.  DATA:    No new data.  [name] [facility] [place] 

## 2023-05-22 ENCOUNTER — Other Ambulatory Visit: Payer: Self-pay

## 2023-05-22 DIAGNOSIS — N185 Chronic kidney disease, stage 5: Secondary | ICD-10-CM

## 2023-05-22 NOTE — Telephone Encounter (Signed)
Spoke with patient's daughter, Dahlia Byes DPR on file. Patient scheduled for surgery on 6/18. Instructions provided, including NPO and to stop Eliquis 48hrs prior. She voiced understanding and stated she will relay information to sister, Ardyth Kelso.   Left VM for Toy Baker at [facility], to provide information regarding upcoming surgery.

## 2023-05-22 NOTE — Telephone Encounter (Signed)
Left VM for patient to return call

## 2023-05-24 NOTE — Progress Notes (Unsigned)
  Electrophysiology Office Note:   Date:  05/24/2023  ID:  [name], DOB [birth year], MRN [id number]  Primary Cardiologist: Chilton Si, MD Electrophysiologist: Dr. Johney Frame -> Hillis Range, MD (Inactive) ***  History of Present Illness:   [name] is a 77 y.o. female with h/o CHB /s/p PPM, h/o GIBs 2/2 AVMs, chronic anemia, HLD, THN, CKD IV, permanent AF, atypical CP (normal cors), HCM, Tracheal stenosis > tracheostemy, COPD, and HTN seen today for routine electrophysiology followup. Since last being seen in our clinic the patient reports doing ***.  she denies chest pain, palpitations, dyspnea, PND, orthopnea, nausea, vomiting, dizziness, syncope, edema, weight gain, or early satiety.   Review of systems complete and found to be negative unless listed in HPI.   Device History: Abbott dual chamber PPM, programmed VVIR, implanted 07/08/1985, last gen change 04/08/11 Device dependent   Studies Reviewed:    PPM Interrogation-  reviewed in detail today,  See PACE[facility] report.  EKG is ordered today. Personal review shows ***  {Select studies to display:26339}  Risk Assessment/Calculations:   {Does this patient have ATRIAL FIBRILLATION?:[id number]} No BP recorded.  {Refresh Note OR Click here to enter BP  :1}***        Physical Exam:   VS:  There were no vitals taken for this visit.   Wt Readings from Last 3 Encounters:  05/21/23 118 lb (53.5 kg)  11/27/22 120 lb (54.4 kg)  10/23/22 122 lb (55.3 kg)     GEN: Well nourished, well developed in no acute distress NECK: No JVD; No carotid bruits CARDIAC: {EPRHYTHM:28826}, no murmurs, rubs, gallops RESPIRATORY:  Clear to auscultation without rales, wheezing or rhonchi  ABDOMEN: Soft, non-tender, non-distended EXTREMITIES:  No edema; No deformity   ASSESSMENT AND PLAN:    CHB s/p Abbott PPM  Normal PPM function See Pace Art report No changes today  Permanent AF Has been on low dose Eliquis (appropriate for weight and  Cr)  HTN Stable on current regimen   Tracheal stenosis Follows with ENT  HCM Chronic CHF No symptoms or exam findings of volume OL  {Click here to Review PMH, Prob List, Meds, Allergies, SHx, FHx  :1}   Disposition:   Follow up with {EPPROVIDERS:28135} {EPFOLLOW UP:28173}  Signed, Graciella Freer, PA-C

## 2023-05-26 ENCOUNTER — Encounter: Payer: Self-pay | Admitting: Student

## 2023-05-26 ENCOUNTER — Ambulatory Visit: Payer: Medicare (Managed Care) | Attending: Cardiology | Admitting: Student

## 2023-05-26 VITALS — BP 102/60 | HR 70 | Ht 65.0 in | Wt 114.0 lb

## 2023-05-26 DIAGNOSIS — I4821 Permanent atrial fibrillation: Secondary | ICD-10-CM | POA: Diagnosis not present

## 2023-05-26 DIAGNOSIS — Z950 Presence of cardiac pacemaker: Secondary | ICD-10-CM | POA: Diagnosis not present

## 2023-05-26 DIAGNOSIS — I442 Atrioventricular block, complete: Secondary | ICD-10-CM | POA: Diagnosis not present

## 2023-05-26 LAB — CUP PACEART INCLINIC DEVICE CHECK
Battery Impedance: 6100 Ohm
Battery Remaining Longevity: 21 mo
Battery Voltage: 2.73 V
Date Time Interrogation Session: 20240611130812
Implantable Lead Connection Status: 753985
Implantable Lead Connection Status: 753985
Implantable Lead Implant Date: 19860725
Implantable Lead Implant Date: 19860725
Implantable Lead Location: 753859
Implantable Lead Location: 753860
Implantable Lead Serial Number: 652511
Implantable Pulse Generator Implant Date: 20120424
Lead Channel Impedance Value: 380 Ohm
Lead Channel Pacing Threshold Amplitude: 1 V
Lead Channel Pacing Threshold Pulse Width: 0.5 ms
Lead Channel Setting Pacing Amplitude: 2.5 V
Lead Channel Setting Pacing Pulse Width: 0.5 ms
Lead Channel Setting Sensing Sensitivity: 8 mV
Pulse Gen Serial Number: 2406848

## 2023-05-26 NOTE — Patient Instructions (Signed)
Medication Instructions:  Your physician recommends that you continue on your current medications as directed. Please refer to the Current Medication list given to you today.  *If you need a refill on your cardiac medications before your next appointment, please call your pharmacy*  Lab Work: None ordered If you have labs (blood work) drawn today and your tests are completely normal, you will receive your results only by: MyChart Message (if you have MyChart) OR A paper copy in the mail If you have any lab test that is abnormal or we need to change your treatment, we will call you to review the results.  Follow-Up: At [facility], you and your health needs are our priority.  As part of our continuing mission to provide you with exceptional heart care, we have created designated Provider Care Teams.  These Care Teams include your primary Cardiologist (physician) and Advanced Practice Providers (APPs -  Physician Assistants and Nurse Practitioners) who all work together to provide you with the care you need, when you need it.  Your next appointment:   6 month(s)  Provider:   Will Camnitz, MD  

## 2023-05-29 ENCOUNTER — Encounter (HOSPITAL_COMMUNITY): Payer: Self-pay | Admitting: Vascular Surgery

## 2023-05-29 ENCOUNTER — Ambulatory Visit (HOSPITAL_BASED_OUTPATIENT_CLINIC_OR_DEPARTMENT_OTHER): Payer: Medicare (Managed Care) | Admitting: Family

## 2023-05-29 ENCOUNTER — Other Ambulatory Visit: Payer: Self-pay

## 2023-05-29 ENCOUNTER — Encounter: Payer: Self-pay | Admitting: Cardiology

## 2023-05-29 ENCOUNTER — Encounter (HOSPITAL_BASED_OUTPATIENT_CLINIC_OR_DEPARTMENT_OTHER): Payer: Self-pay | Admitting: Family

## 2023-05-29 VITALS — BP 118/78 | HR 70 | Ht 65.0 in | Wt 118.4 lb

## 2023-05-29 DIAGNOSIS — I4821 Permanent atrial fibrillation: Secondary | ICD-10-CM | POA: Diagnosis not present

## 2023-05-29 DIAGNOSIS — I422 Other hypertrophic cardiomyopathy: Secondary | ICD-10-CM

## 2023-05-29 DIAGNOSIS — I10 Essential (primary) hypertension: Secondary | ICD-10-CM | POA: Diagnosis not present

## 2023-05-29 DIAGNOSIS — D6859 Other primary thrombophilia: Secondary | ICD-10-CM | POA: Diagnosis not present

## 2023-05-29 DIAGNOSIS — I700 Atherosclerosis of aorta: Secondary | ICD-10-CM

## 2023-05-29 DIAGNOSIS — E782 Mixed hyperlipidemia: Secondary | ICD-10-CM

## 2023-05-29 DIAGNOSIS — Z950 Presence of cardiac pacemaker: Secondary | ICD-10-CM

## 2023-05-29 DIAGNOSIS — N184 Chronic kidney disease, stage 4 (severe): Secondary | ICD-10-CM | POA: Diagnosis not present

## 2023-05-29 DIAGNOSIS — I5042 Chronic combined systolic (congestive) and diastolic (congestive) heart failure: Secondary | ICD-10-CM

## 2023-05-29 MED ORDER — TORSEMIDE 20 MG PO TABS
40.0000 mg | ORAL_TABLET | Freq: Every day | ORAL | 3 refills | Status: DC
Start: 2023-05-29 — End: 2023-07-26

## 2023-05-29 MED ORDER — ROSUVASTATIN CALCIUM 40 MG PO TABS
40.0000 mg | ORAL_TABLET | Freq: Every day | ORAL | 3 refills | Status: DC
Start: 2023-05-29 — End: 2023-07-27

## 2023-05-29 NOTE — Progress Notes (Signed)
Spoke with pt's daughter, Dia Luc for pre-op call. Pt has an extensive cardiac history. Pt has hx of complete heart block and A-fib. Pt has a pacemaker. [employer] rep has been notified and will be her DOS. [name] states pt has never been diagnosed with Diabetes. Her chart has that as part pt's medical history.   Shower instructions given to [employer]. Pt gets her medications from [facility] and [name] states she can pull off the pills that pt will need to take the day of surgery.

## 2023-05-29 NOTE — Patient Instructions (Signed)
Medication Instructions:  Your physician recommends that you continue on your current medications as directed. Please refer to the Current Medication list given to you today.  *If you need a refill on your cardiac medications before your next appointment, please call your pharmacy*   Lab Work: Your physician recommends that you return for lab work today- Lipid panel and Liver function tests  If you have labs (blood work) drawn today and your tests are completely normal, you will receive your results only by: MyChart Message (if you have MyChart) OR A paper copy in the mail If you have any lab test that is abnormal or we need to change your treatment, we will call you to review the results.   Testing/Procedures: Your physician has requested that you have an echocardiogram in 2-3 months . Echocardiography is a painless test that uses sound waves to create images of your heart. It provides your doctor with information about the size and shape of your heart and how well your heart's chambers and valves are working. This procedure takes approximately one hour. There are no restrictions for this procedure. Please do NOT wear cologne, perfume, aftershave, or lotions (deodorant is allowed). Please arrive 15 minutes prior to your appointment time.    Follow-Up: At [facility], you and your health needs are our priority.  As part of our continuing mission to provide you with exceptional heart care, we have created designated Provider Care Teams.  These Care Teams include your primary Cardiologist (physician) and Advanced Practice Providers (APPs -  Physician Assistants and Nurse Practitioners) who all work together to provide you with the care you need, when you need it.  We recommend signing up for the patient portal called "MyChart".  Sign up information is provided on this After Visit Summary.  MyChart is used to connect with patients for Virtual Visits (Telemedicine).  Patients are able  to view lab/test results, encounter notes, upcoming appointments, etc.  Non-urgent messages can be sent to your provider as well.   To learn more about what you can do with MyChart, go to [email or web address].    Your next appointment:   3-4 months with Dr. Duke Salvia or Gillian [name], NP   Other Instructions Exercises to do While Sitting  Exercises that you do while sitting (chair exercises) can give you many of the same benefits as full exercise. Benefits include strengthening your heart, burning calories, and keeping muscles and joints healthy. Exercise can also improve your mood and help with depression and anxiety. You may benefit from chair exercises if you are unable to do standing exercises due to: Diabetic foot pain. Obesity. Illness. Arthritis. Recovery from surgery or injury. Breathing problems. Balance problems. Another type of disability. Before starting chair exercises, check with your health care provider or a physical therapist to find out how much exercise you can tolerate and which exercises are safe for you. If your health care provider approves: Start out slowly and build up over time. Aim to work up to about 10-20 minutes for each exercise session. Make exercise part of your daily routine. Drink water when you exercise. Do not wait until you are thirsty. Drink every 10-15 minutes. Stop exercising right away if you have pain, nausea, shortness of breath, or dizziness. If you are exercising in a wheelchair, make sure to lock the wheels. Ask your health care provider whether you can do tai chi or yoga. Many positions in these mind-body exercises can be modified to do while seated. Warm-up Before  starting other exercises: Sit up as straight as you can. Have your knees bent at 90 degrees, which is the shape of the capital letter "L." Keep your feet flat on the floor. Sit at the front edge of your chair, if you can. Pull in (tighten) the muscles in your abdomen and  stretch your spine and neck as straight as you can. Hold this position for a few minutes. Breathe in and out evenly. Try to concentrate on your breathing, and relax your mind. Stretching Exercise A: Arm stretch Hold your arms out straight in front of your body. Bend your hands at the wrist with your fingers pointing up, as if signaling someone to stop. Notice the slight tension in your forearms as you hold the position. Keeping your arms out and your hands bent, rotate your hands outward as far as you can and hold this stretch. Aim to have your thumbs pointing up and your pinkie fingers pointing down. Slowly repeat arm stretches for one minute as tolerated. Exercise B: Leg stretch If you can move your legs, try to "draw" letters on the floor with the toes of your foot. Write your name with one foot. Write your name with the toes of your other foot. Slowly repeat the movements for one minute as tolerated. Exercise C: Reach for the sky Reach your hands as far over your head as you can to stretch your spine. Move your hands and arms as if you are climbing a rope. Slowly repeat the movements for one minute as tolerated. Range of motion exercises Exercise A: Shoulder roll Let your arms hang loosely at your sides. Lift just your shoulders up toward your ears, then let them relax back down. When your shoulders feel loose, rotate your shoulders in backward and forward circles. Do shoulder rolls slowly for one minute as tolerated. Exercise B: March in place As if you are marching, pump your arms and lift your legs up and down. Lift your knees as high as you can. If you are unable to lift your knees, just pump your arms and move your ankles and feet up and down. March in place for one minute as tolerated. Exercise C: Seated jumping jacks Let your arms hang down straight. Keeping your arms straight, lift them up over your head. Aim to point your fingers to the ceiling. While you lift your arms,  straighten your legs and slide your heels along the floor to your sides, as wide as you can. As you bring your arms back down to your sides, slide your legs back together. If you are unable to use your legs, just move your arms. Slowly repeat seated jumping jacks for one minute as tolerated. Strengthening exercises Exercise A: Shoulder squeeze Hold your arms straight out from your body to your sides, with your elbows bent and your fists pointed at the ceiling. Keeping your arms in the bent position, move them forward so your elbows and forearms meet in front of your face. Open your arms back out as wide as you can with your elbows still bent, until you feel your shoulder blades squeezing together. Hold for 5 seconds. Slowly repeat the movements forward and backward for one minute as tolerated. Contact a health care provider if: You have to stop exercising due to any of the following: Pain. Nausea. Shortness of breath. Dizziness. Fatigue. You have significant pain or soreness after exercising. Get help right away if: You have chest pain. You have difficulty breathing. These symptoms may represent a  serious problem that is an emergency. Do not wait to see if the symptoms will go away. Get medical help right away. Call your local emergency services (911 in the [place] Do not drive yourself to the hospital. Summary Exercises that you do while sitting (chair exercises) can strengthen your heart, burn calories, and keep muscles and joints healthy. You may benefit from chair exercises if you are unable to do standing exercises due to diabetic foot pain, obesity, recovery from surgery or injury, or other conditions. Before starting chair exercises, check with your health care provider or a physical therapist to find out how much exercise you can tolerate and which exercises are safe for you. This information is not intended to replace advice given to you by your health care provider. Make sure you  discuss any questions you have with your health care provider. Document Revised: 01/27/2021 Document Reviewed: 01/27/2021 [employer] Patient Education  2024 [employer].

## 2023-05-29 NOTE — Progress Notes (Signed)
Cardiology Office Note:  .   Date:  05/29/2023  ID:  [name], DOB 09/20/1946, MRN [id number] PCP: [facility]  [facility] Providers Cardiologist:  Chilton Si, MD Electrophysiologist:  Will Jorja Loa, MD  Nephrology: Dr. Ronalee Belts    History of Present Illness: .   [name] is a 77 y.o. female CHB s/p Abbott PPM, primary atrial fibrillation, hypertension, aortic atherosclerosis, tracheal stenosis, CVA 04/2019, GI bleed 2012, hypertrophic cardiomyopathy/chronic congestive heart failure, CKD IV, COPD.  She was last seen 05/26/2023 by EP team.  Initial pacemaker placed 1986 for CHB.  Prior GI bleed 2012.  Echo 09/2021 LVEF 40-45%, gr2DD, elevated LVEDP, RVSF mildly reduced, moderately elevated PASP, LA massively dilated, mild to moderate MR, mild dilation ascending aorta 38mm, AV sclerosis without stenosis, bilateral ventricle with speckled appearance. Prior PYP scan 11/2021 not suggestive of ATTR cardiac amyloidosis.  Cardiac MRI unable to be performed due to PPM. 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  She last saw the heart failure clinic 09/2022 and due to volume overload torsemide was increased to 40 mg daily. 7 day ZIO recommended to rule out ventricular arrhythmias but not performed. She was recommended to follow up in 2 months with echo which as not performed.  She has upcoming insertion of L AV fistula with Dr. Edilia Bo 06/02/23. Prior fistula did not work.   Presents today for follow up with family member.  Weight down 13 lbs from clinic visit 8 months ago. Reports no chest pain, pressure, tightness. Reports exertional dyspnea with more than usual activity which is overall unchanged. No orthopnea, PND, edema. She reports  occasional episodes of lightheadedness described as needing to sit down. Notes she has been eating a more heart healthy diet. No formal exercise routine. Predominantly sedentary sitting watching TV during the day.  Her family does try to get her to get exercise to activities at [facility] once per week.    ROS: Please see the history of present illness.    All other systems reviewed and are negative.   Studies Reviewed: [name]    EKG: EKG is ordered today.  EKG performed today demonstrates V paced rhythm 70 bpm with occasional PVC.  Cardiac Studies & Procedures   CARDIAC CATHETERIZATION  CARDIAC CATHETERIZATION 08/19/2016  Narrative Findings:  Ao = 145/79 (105) LV = 139/10/18 RA = 7 RV = 68/9 PA = 64/18 (38) PCW = 22 (v waves to 37) Fick cardiac output/index = 5.4/2.9 PVR = 3.0 WU Ao sat = 94% PA sat = 59%, 59%  Assessment: 1. Normal coronary arteries 2. Mild to moderate pulmonary HTN with normal PVR (mostly left-sided) 3. Prominent v-waves in PCWP tracing suggestive of severe diastolic dysfunction or significant mitral regurgitation 4. Normal LV function and cardiac output  Plan/Discussion:  Continue medical therapy. Optimize diuretic regimen. Review echo to assess degree of MR.  Bensimhon, Daniel,MD 1:22 PM  Findings Coronary Findings Diagnostic  Dominance: Right  Left Main Vessel is angiographically normal.  Left Anterior Descending Vessel is angiographically normal.  Left Circumflex Vessel is angiographically normal.  Right Coronary Artery Vessel is angiographically normal.  Intervention  No interventions have been documented.   STRESS TESTS  NM MYOCAR MULTI W/SPECT W 11/26/2015  Narrative  There was no ST segment deviation noted during stress.  This is a low  risk study.  The left ventricular ejection fraction is mildly decreased (45-54%). There is paradoxical septal motion consistent with paced rhythm.  There are significant artifacts - extracardiac  activity and a hot spot on resting images that are affecting interpretation of this study.  There is a small perfusion defect in the apical anterior wall on the stress images, however this is unchanged from the study performed on 10/03/2014. No definite ischemia.   ECHOCARDIOGRAM  ECHOCARDIOGRAM COMPLETE 09/25/2021  Narrative ECHOCARDIOGRAM REPORT    Patient Name:   [name] Date of Exam: 09/25/2021 Medical Rec #:  [id number]             Height:       62.0 in Accession #:    [id number]            Weight:       128.0 lb Date of Birth:  [birth year]             BSA:          1.581 m Patient Age:    75 years              BP:           110/68 mmHg Patient Gender: F                     HR:           70 bpm. Exam Location:  Outpatient  Procedure: 2D Echo, Cardiac Doppler, Color Doppler and Strain Analysis  Indications:    I48.2 Chronic atrial fibrillation,; I50.20*; I50.40* Unspecified combined systolic (congestive) and diastolic (congestive) heart failure  History:        Patient has prior history of Echocardiogram examinations, most recent 05/09/2019. CHF and Hypertrophic Cardiomyopathy, Previous Myocardial Infarction, Pacemaker, COPD, Arrythmias:Atrial Fibrillation, Signs/Symptoms:Edema; Risk Factors:Hypertension, CKD stage 4, Former Smoker, Dyslipidemia and Sleep Apnea. Patient dennies chest pain or SOB. Patient does have leg edema. Tracheotomy in place. Pre-op for enteroscopy procedure.  Sonographer:    Carlos American RVT, RDCS (AE), RDMS Referring Phys: [id number] TIFFANY [place]  IMPRESSIONS   1. Speckled appearance of the myocardium, concerning for possible infiltrative cardiomyopathy such as amyloid (although patient carries diagnosis of HCM) - no evidence for increased LVOT gradient.. Left ventricular ejection fraction, by estimation, is 40 to 45%. The left ventricle has mildly decreased function. The left ventricle demonstrates global hypokinesis. There is severe  left ventricular hypertrophy. Left ventricular diastolic parameters are consistent with Grade II diastolic dysfunction (pseudonormalization). Elevated left ventricular end-diastolic pressure. The E/e' is 22. 2. Abnormal RV free wall strain at -7.9%. RV myocardium is speckled. Right ventricular systolic function is mildly reduced. The right ventricular size is normal. Mildly increased right ventricular wall thickness. There is moderately elevated pulmonary artery systolic pressure. The estimated right ventricular systolic pressure is 52.1 mmHg. 3. Left atrial size was massively dilated. 4. The pericardial effusion is posterior to the left ventricle. 5. The mitral valve is abnormal. Mild to moderate mitral valve regurgitation. Moderate mitral annular calcification. 6. Tricuspid valve regurgitation is mild to moderate. 7. The aortic valve is tricuspid. Aortic valve regurgitation is not visualized. Mild aortic valve sclerosis is present, with no evidence of aortic valve stenosis. 8. Aortic dilatation noted. There is mild dilatation of the ascending aorta, measuring 38 mm. 9. The inferior vena cava is dilated in size with >50% respiratory variability, suggesting right atrial pressure of 8 mmHg.  Comparison(s): Changes from prior study are noted. 05/09/2019: LVEF 65%.  Conclusion(s)/Recommendation(s): Findings concerning for infiltrative vs hypertrophic cardiomyopathy, would recommend Cardiac MRI for clarification.  FINDINGS Left Ventricle: Speckled appearance of the myocardium, concerning for possible infiltrative cardiomyopathy such as amyloid (although patient carries diagnosis of HCM) - no evidence for increased LVOT gradient. Left ventricular ejection fraction, by estimation, is 40 to 45%. The left ventricle has mildly decreased function. The left ventricle demonstrates global hypokinesis. The left ventricular internal cavity size was normal in size. There is severe left ventricular hypertrophy. Left  ventricular diastolic parameters are consistent with Grade II diastolic dysfunction (pseudonormalization). Elevated left ventricular end-diastolic pressure. The E/e' is 22.  Right Ventricle: Abnormal RV free wall strain at -7.9%. RV myocardium is speckled. The right ventricular size is normal. Mildly increased right ventricular wall thickness. Right ventricular systolic function is mildly reduced. There is moderately elevated pulmonary artery systolic pressure. The tricuspid regurgitant velocity is 3.32 m/s, and with an assumed right atrial pressure of 8 mmHg, the estimated right ventricular systolic pressure is 52.1 mmHg.  Left Atrium: Left atrial size was massively dilated.  Right Atrium: Right atrial size was normal in size.  Pericardium: Trivial pericardial effusion is present. The pericardial effusion is posterior to the left ventricle.  Mitral Valve: The mitral valve is abnormal. There is moderate thickening of the mitral valve leaflet(s). There is moderate calcification of the anterior and posterior mitral valve leaflet(s). Moderate mitral annular calcification. Mild to moderate mitral valve regurgitation.  Tricuspid Valve: The tricuspid valve is grossly normal. Tricuspid valve regurgitation is mild to moderate.  Aortic Valve: The aortic valve is tricuspid. Aortic valve regurgitation is not visualized. Mild aortic valve sclerosis is present, with no evidence of aortic valve stenosis. Aortic valve mean gradient measures 3.0 mmHg. Aortic valve peak gradient measures 5.0 mmHg. Aortic valve area, by VTI measures 1.23 cm.  Pulmonic Valve: The pulmonic valve was grossly normal. Pulmonic valve regurgitation is trivial.  Aorta: Aortic dilatation noted. There is mild dilatation of the ascending aorta, measuring 38 mm.  Venous: The inferior vena cava is dilated in size with greater than 50% respiratory variability, suggesting right atrial pressure of 8 mmHg.  IAS/Shunts: No atrial level shunt  detected by color flow Doppler.  Additional Comments: A device lead is visualized.   LEFT VENTRICLE PLAX 2D LVIDd:         4.85 cm   Diastology LVIDs:         3.35 cm   LV e' medial:    3.75 cm/s LV PW:         1.56 cm   LV E/e' medial:  27.0 LV IVS:        1.67 cm   LV e' lateral:   5.56 cm/s LVOT diam:     1.80 cm   LV E/e' lateral: 18.2 LV SV:         26 LV SV Index:   17 LVOT Area:     2.54 cm  3D Volume EF: 3D EF:        39 % LV EDV:       149 ml LV ESV:       91 ml LV SV:        58 ml  RIGHT VENTRICLE RV Basal diam:  3.93 cm RV Mid diam:    2.97 cm RV S prime:     8.12 cm/s TAPSE (M-mode): 1.4 cm  LEFT ATRIUM              Index  RIGHT ATRIUM           Index LA diam:        4.10 cm  2.59 cm/m    RA Area:     15.80 cm LA Vol (A2C):   108.0 ml 68.29 ml/m   RA Volume:   42.30 ml  26.75 ml/m LA Vol (A4C):   161.0 ml 101.81 ml/m LA Biplane Vol: 137.0 ml 86.63 ml/m AORTIC VALVE                    PULMONIC VALVE AV Area (Vmax):    1.30 cm     PV Vmax:          0.57 m/s AV Area (Vmean):   1.18 cm     PV Peak grad:     1.3 mmHg AV Area (VTI):     1.23 cm     PR End Diast Vel: 9.42 msec AV Vmax:           111.40 cm/s AV Vmean:          76.360 cm/s AV VTI:            0.215 m AV Peak Grad:      5.0 mmHg AV Mean Grad:      3.0 mmHg LVOT Vmax:         56.70 cm/s LVOT Vmean:        35.300 cm/s LVOT VTI:          0.104 m LVOT/AV VTI ratio: 0.48  AORTA Ao Root diam: 3.20 cm Ao Asc diam:  3.80 cm  MITRAL VALVE                TRICUSPID VALVE MV Area (PHT): 5.04 cm     TR Peak grad:   44.1 mmHg MV Decel Time: 151 msec     TR Vmax:        332.00 cm/s MR Peak grad: 86.7 mmHg MR Mean grad: 62.0 mmHg     SHUNTS MR Vmax:      465.50 cm/s   Systemic VTI:  0.10 m MR Vmean:     370.0 cm/s    Systemic Diam: 1.80 cm MV E velocity: 101.33 cm/s MV A velocity: 28.80 cm/s MV E/A ratio:  3.52  [name] Shutter MD Electronically signed by [name] Shutter MD Signature  Date/Time: 09/25/2021/5:11:04 PM    Final   TEE  ECHO TEE 07/10/2016  Narrative *[place]* *[facility]* [address] [place], [place] [address] [phone]  ------------------------------------------------------------------- Transesophageal Echocardiography  Patient:    [name], [name] MR #:       [id number] Study Date: 07/10/2016 Gender:     F Age:        70 Height:     165.1 cm Weight:     83.6 kg BSA:        1.99 m^2 Pt. Status: Room:  PERFORMING   Charlton Haws, M.D. ADMITTING    Hillis Range, MD ATTENDING    Hillis Range, MD SONOGRAPHER  Leta Jungling, RDCS ORDERING     Glory Buff, Amber Alton Revere, Amber  cc:  -------------------------------------------------------------------  ------------------------------------------------------------------- Indications:      Atrial fibrillation - 427.31.  ------------------------------------------------------------------- History:   PMH:  Chronic Kidney Disease. Shortness of Breath. Lower Extremity Edema. Hypertrophic Cardiomyopathy,  Congestive heart failure.  Chronic obstructive pulmonary disease.  Risk factors: History Of Tobacco Use. Hypertension. Dyslipidemia.  ------------------------------------------------------------------- Study Conclusions  - Procedure narrative: Transesophageal  echocardiography. A transesophageal probe was inserted by the attending cardiologistwith significant difficulty. Image quality was poor. This was a technically difficult study. Scanning was performed from the mid esophageal acoustic windows. Satisfactory imaging could not be obtained from the esophageal, high esophageal, low esophageal, transgastric, High transgastric, or deep transgastric acoustic window(s). The probe was removed prematurely because of hemodynamic compromise. - Impressions: See CV procedure note in Epic TEE pre Watchman. Anesthesia: Dr Randa Evens and CNA propofol, fentanyl and  versed  Difficulty passing probe. Asked for assistance and Dr Hart Rochester from anesthesia able to pass probe. Patient has some desats prior to probe passage that improved with oral airway and bagging After probe placement limited images showd stunned LV with EF 20-25% non obbstructive HOCM no significant MR or LVOT gradient and no effusion.  BP dropped 60/palp Paient resuccitated with saline, 2 rounds of epi and dpoamine Intubated by anesthesia commented on edematous airway no active bleeding Difficult intubation  After about 10 minutes hemodynamics stable dopa turned off and patient with paced HR 78 BP 178/98 and sats 100%  TTE: : still with low EF stunned EF 25% with non obstructive HOCM septum close to 20 mm RV mildly dilated with pacing wires no effusion and no significant MR.  Vancomycin, Clinidamycin and Solumedrol given to cover for any possible esophageal trauma Remain intubated and transfer to MICU. Anesthesia would likel to keep intubated for a bit given edema in airway. Will have soft tissue CT of neck  Impressions:  - See CV procedure note in Epic TEE pre Watchman. Anesthesia: Dr Randa Evens and CNA propofol, fentanyl and versed  Difficulty passing probe. Asked for assistance and Dr Hart Rochester from anesthesia able to pass probe. Patient has some desats prior to probe passage that improved with oral airway and bagging After probe placement limited images showd stunned LV with EF 20-25% non obbstructive HOCM no significant MR or LVOT gradient and no effusion.  BP dropped 60/palp Paient resuccitated with saline, 2 rounds of epi and dpoamine Intubated by anesthesia commented on edematous airway no active bleeding Difficult intubation  After about 10 minutes hemodynamics stable dopa turned off and patient with paced HR 78 BP 178/98 and sats 100%  TTE: : still with low EF stunned EF 25% with non obstructive HOCM septum close to 20 mm RV mildly dilated with pacing wires  no effusion and no significant MR.  Vancomycin, Clinidamycin and Solumedrol given to cover for any possible esophageal trauma Remain intubated and transfer to MICU. Anesthesia would likel to keep intubated for a bit given edema in airway. Will have soft tissue CT of neck  ------------------------------------------------------------------- Study data:   Study status:  Routine.  Consent:  The risks, benefits, and alternatives to the procedure were explained to the patient and informed consent was obtained.  Procedure:  The patient reported no pain pre or post test. Initial setup. The patient was brought to the laboratory. Surface ECG leads were monitored. Sedation. Moderate sedation with intermittent deep sedation was administered by anesthesiology staff. Transesophageal echocardiography. A transesophageal probe was inserted by the attending cardiologistwith significant difficulty. Image quality was poor. This was a technically difficult study. Scanning was performed from the mid esophageal acoustic windows. Satisfactory imaging could not be obtained from the esophageal, high esophageal, low esophageal, transgastric, High transgastric, or deep transgastric acoustic window(s). The probe was removed prematurely because of hemodynamic compromise.  Study completion:  The patient tolerated the procedure well. There were no complications. Diagnostic transesophageal echocardiography.  2D and color Doppler.  Birthdate:  Patient birthdate: January 01, 1946.  Age:  Patient is 77 yr old.  Sex:  Gender: female.    BMI: 30.7 kg/m^2.  Blood pressure: 138/84  Patient status:  Inpatient.  Study date:  Study date: 07/10/2016. Study time: 12:27 PM.  Location:  Catheterization laboratory.  -------------------------------------------------------------------  ------------------------------------------------------------------- Prepared and Electronically Authenticated by  Charlton Haws,  M.D. 2017-07-27T13:59:10      PYP SCAN  MYOCARDIAL AMYLOID PLANAR AND SPECT 11/21/2021  Narrative   By semi-quantitative assessment scan is consistent with no increased heart uptake-Grade 0. Heart to contralateral lung ratio is between 1-1.5, indeterminate for amyloid.   Study is equivocal for TTR amyloidosis (visual score of 1/ratio between 1-1.5).   Prior study not available for comparison.  Ratio of 1.2 indeterminate but Rib uptake is very much more than myocardial uptake And study not likely representative of amyloid        Risk Assessment/Calculations:    CHA2DS2-VASc Score = 8   This indicates a 10.8% annual risk of stroke. The patient's score is based upon: CHF History: 1 HTN History: 1 Diabetes History: 0 Stroke History: 2 Vascular Disease History: 1 Age Score: 2 Gender Score: 1            Physical Exam:   VS:  BP 118/78   Pulse 70   Ht 5\' 5"  (1.651 m)   Wt 118 lb 6.4 oz (53.7 kg)   SpO2 99%   BMI 19.70 kg/m    Wt Readings from Last 3 Encounters:  05/29/23 118 lb 6.4 oz (53.7 kg)  05/26/23 114 lb (51.7 kg)  05/21/23 118 lb (53.5 kg)    GEN: Well nourished, well developed in no acute distress NECK: No JVD; No carotid bruits CARDIAC: RRR, no murmurs, rubs, gallops RESPIRATORY:  Clear to auscultation without rales, wheezing or rhonchi  ABDOMEN: Soft, non-tender, non-distended EXTREMITIES:  No edema; No deformity   ASSESSMENT AND PLAN: .    Chronic congestive heart failure / HCM - Euvolemic and well compensated on exam. Update echo to reassess LVEF. Follows with HF clinic as well. GDMT limited by renal function, BP. Continue Toprol 12.5mg , Torsemide 40mg  every day. Low sodium diet, fluid restriction <2L, and daily weights encouraged. Educated to contact our office for weight gain of 2 lbs overnight or 5 lbs in one week.   Hypertension- BP well controlled. Continue current antihypertensive regimen torsemide 40 mg daily, Toprol 12.5 mg daily. Discussed to  monitor BP at home at least 2 hours after medications and sitting for 5-10 minutes.   Permanent atrial fibrillation/hypercoagulable state-EKG today V paced rhythm with occasional PVC.  Rate controlled continue present dose Toprol 12.5 mg daily.  Appropriate anticoagulated with Eliquis 2.5 mg twice daily.  Reduced dose due to function, body habitus.  Denies bleeding complications.  CKD IV -continue to follow with nephrology. Upcoming fistula placement.  Aortic atherosclerosis / HLD - Stable with no anginal symptoms. No indication for ischemic evaluation.  Continue rosuvastatin 40 mg daily.  Refills provided.  Update lipid panel, LFT today.  S/p PPM - Follows with EP.        Dispo: follow up in 3-4 mos with Dr. Duke Salvia or Alver Sorrow, NP   Signed, Alver Sorrow, NP

## 2023-05-29 NOTE — Progress Notes (Signed)
PERIOPERATIVE PRESCRIPTION FOR IMPLANTED CARDIAC DEVICE PROGRAMMING  Patient Information: Name:  Shareeta Sjoblom  DOB:  1945-12-26  MRN:  [id number]   Planned Procedure:  Insertion of left upper arm Arteriovenous Gortex Graft  Surgeon:  Dr. [name]  Date of Procedure:  06/02/23  Cautery will be used.  Position during surgery:  supine   Device Information:  Clinic EP Physician:  Loman Brooklyn, MD   Device Type:  Pacemaker Manufacturer and Phone #:  [employer]: [phone] Pacemaker Dependent?:  Yes.   Date of Last Device Check:  05/26/2023 Normal Device Function?:  Yes.    Electrophysiologist's Recommendations:  Have magnet available. Provide continuous ECG monitoring when magnet is used or reprogramming is to be performed.  Procedure may interfere with device function.  Magnet should be placed over device during procedure.  Per Device Clinic Standing Orders, Wiliam Ke, RN  12:06 PM 05/29/2023

## 2023-05-29 NOTE — Progress Notes (Signed)
Called [employer] rep for pacemaker. Spoke with Lubertha Sayres and she states a rep will be here Tuesday, 06/02/23 to do a pacemaker check and place magnet during procedure.

## 2023-05-29 NOTE — Progress Notes (Signed)
   05/29/23 1351  Pre-op Phone Call  Surgery Date Verified 06/02/23  Arrival Time Verified 0730  Surgery Location Verified Admitting  Medical History Reviewed Yes  Is the patient taking a GLP-1 receptor agonist? No  Does the patient have diabetes? No diagnosis of diabetes  Do you have a history of heart problems? Yes  Cardiologist Name Dr. Duke Salvia  Have you ever had tests on your heart? Yes  What cardiac tests were performed? Cardiac Cath;Echo;EKG  What date/year were cardiac tests completed? Cath - 2017, Echo -09/25/21, EKG - 05/26/23  Results viewable: [facility] Media Tab  Antiarrhythmic device type Permanent Pacemaker  Does patient have other implanted devices? No  Patient Teaching Pre / Post Procedure  Patient educated about smoking cessation 24 hours prior to surgery. N/A Non-Smoker  Patient verbalizes understanding of bowel prep? N/A  Med Rec Completed Yes  Take the Following Meds the Morning of Surgery Metoprolol, Prednisone, Mucinex - if needed, Tylenol - prn, Duoneb - prn, Albuterol inhaler - prn (to bring DOS), may use eye drops  Recent  Lab Work, EKG, CXR? Yes  NPO (Including gum & candy) After midnight  Responsible adult to drive and be with you for 24 hours? Yes  Name & Phone Number for Ride/Caregiver Camiah Petrie, [phone]  No Jewelry, money, nail polish or make-up.  No lotions, powders, perfumes. No shaving  48 hrs. prior to surgery. Yes  Contacts, Dentures & Glasses Will Have to be Removed Before OR. Yes  Call this number the morning of surgery  with any problems that may cancel your surgery. [phone]

## 2023-05-30 LAB — HEPATIC FUNCTION PANEL
ALT: 15 IU/L (ref 0–32)
AST: 29 IU/L (ref 0–40)
Albumin: 4.1 g/dL (ref 3.8–4.8)
Alkaline Phosphatase: 62 IU/L (ref 44–121)
Bilirubin Total: 0.3 mg/dL (ref 0.0–1.2)
Bilirubin, Direct: 0.13 mg/dL (ref 0.00–0.40)
Total Protein: 6.8 g/dL (ref 6.0–8.5)

## 2023-05-30 LAB — LIPID PANEL
Chol/HDL Ratio: 2.3 ratio (ref 0.0–4.4)
Cholesterol, Total: 133 mg/dL (ref 100–199)
HDL: 59 mg/dL (ref >39)
LDL Chol Calc (NIH): 55 mg/dL (ref 0–99)
Triglycerides: 101 mg/dL (ref 0–149)
VLDL Cholesterol Cal: 19 mg/dL (ref 5–40)

## 2023-06-01 NOTE — Anesthesia Preprocedure Evaluation (Addendum)
Anesthesia Evaluation  Patient identified by MRN, date of birth, ID band Patient awake    Reviewed: Allergy & Precautions, NPO status , Patient's Chart, lab work & pertinent test results, reviewed documented beta blocker date and time   Airway Mallampati: Trach       Dental  (+) Dental Advisory Given   Pulmonary asthma , sleep apnea , COPD,  COPD inhaler, former smoker Trach dependent    + decreased breath sounds      Cardiovascular hypertension, Pt. on medications and Pt. on home beta blockers + Past MI and +CHF  + dysrhythmias Atrial Fibrillation + pacemaker + Valvular Problems/Murmurs MR  Rhythm:Regular Rate:Normal  TTE 2022 1. Speckled appearance of the myocardium, concerning for possible  infiltrative cardiomyopathy such as amyloid (although patient carries  diagnosis of HCM) - no evidence for increased LVOT gradient.. Left  ventricular ejection fraction, by estimation, is  40 to 45%. The left ventricle has mildly decreased function. The left  ventricle demonstrates global hypokinesis. There is severe left  ventricular hypertrophy. Left ventricular diastolic parameters are  consistent with Grade II diastolic dysfunction  (pseudonormalization). Elevated left ventricular end-diastolic pressure.  The E/e' is 22.   2. Abnormal RV free wall strain at -7.9%. RV myocardium is speckled.  Right ventricular systolic function is mildly reduced. The right  ventricular size is normal. Mildly increased right ventricular wall  thickness. There is moderately elevated pulmonary  artery systolic pressure. The estimated right ventricular systolic  pressure is 52.1 mmHg.   3. Left atrial size was massively dilated.   4. The pericardial effusion is posterior to the left ventricle.   5. The mitral valve is abnormal. Mild to moderate mitral valve  regurgitation. Moderate mitral annular calcification.   6. Tricuspid valve regurgitation is mild  to moderate.   7. The aortic valve is tricuspid. Aortic valve regurgitation is not  visualized. Mild aortic valve sclerosis is present, with no evidence of  aortic valve stenosis.   8. Aortic dilatation noted. There is mild dilatation of the ascending  aorta, measuring 38 mm.   9. The inferior vena cava is dilated in size with >50% respiratory  variability, suggesting right atrial pressure of 8 mmHg.     Neuro/Psych  PSYCHIATRIC DISORDERS Anxiety Depression   Dementia CVA    GI/Hepatic ,GERD  ,,(+)     substance abuse  alcohol use  Endo/Other  diabetes, Well Controlled    Renal/GU ESRFRenal disease (not currently on dialysis)  negative genitourinary   Musculoskeletal negative musculoskeletal ROS (+)    Abdominal   Peds  Hematology  (+) Blood dyscrasia (eliquis)   Anesthesia Other Findings   Reproductive/Obstetrics                             Anesthesia Physical Anesthesia Plan  ASA: 4  Anesthesia Plan: General   Post-op Pain Management:    Induction: Intravenous  PONV Risk Score and Plan: Dexamethasone and Ondansetron  Airway Management Planned: Tracheostomy  Additional Equipment:   Intra-op Plan:   Post-operative Plan: Extubation in OR  Informed Consent: I have reviewed the patients History and Physical, chart, labs and discussed the procedure including the risks, benefits and alternatives for the proposed anesthesia with the patient or authorized representative who has indicated his/her understanding and acceptance.     Dental advisory given  Plan Discussed with: CRNA  Anesthesia Plan Comments: (S/p Trach in 2017. Per 09/03/22 ENT visit by Dr.  Jenne Pane, she has a #4 flexible cuffless Shiley trach in place with a Passy-Muir valve.   Will replace cuffless trach for reinforced endotracheal tube. Will not attempt PNB due patient's chronic respiratory compromise and risk for phrenic nerve involvement.  )       Anesthesia Quick  Evaluation

## 2023-06-02 ENCOUNTER — Ambulatory Visit (HOSPITAL_BASED_OUTPATIENT_CLINIC_OR_DEPARTMENT_OTHER): Payer: Medicare (Managed Care) | Admitting: Anesthesiology

## 2023-06-02 ENCOUNTER — Ambulatory Visit (HOSPITAL_COMMUNITY)
Admission: RE | Admit: 2023-06-02 | Discharge: 2023-06-02 | Disposition: A | Discharge status: Home / Self Care | Payer: Medicare (Managed Care) | Attending: Vascular Surgery | Admitting: Vascular Surgery

## 2023-06-02 ENCOUNTER — Encounter (HOSPITAL_COMMUNITY)
Admission: RE | Disposition: A | Discharge status: Home / Self Care | Payer: Self-pay | Source: Home / Self Care | Attending: Vascular Surgery

## 2023-06-02 ENCOUNTER — Ambulatory Visit (HOSPITAL_COMMUNITY): Payer: Medicare (Managed Care) | Admitting: Anesthesiology

## 2023-06-02 ENCOUNTER — Other Ambulatory Visit: Payer: Self-pay

## 2023-06-02 DIAGNOSIS — I5030 Unspecified diastolic (congestive) heart failure: Secondary | ICD-10-CM | POA: Insufficient documentation

## 2023-06-02 DIAGNOSIS — E1122 Type 2 diabetes mellitus with diabetic chronic kidney disease: Secondary | ICD-10-CM

## 2023-06-02 DIAGNOSIS — I252 Old myocardial infarction: Secondary | ICD-10-CM | POA: Diagnosis not present

## 2023-06-02 DIAGNOSIS — N186 End stage renal disease: Secondary | ICD-10-CM | POA: Diagnosis not present

## 2023-06-02 DIAGNOSIS — Z79891 Long term (current) use of opiate analgesic: Secondary | ICD-10-CM | POA: Diagnosis not present

## 2023-06-02 DIAGNOSIS — Z950 Presence of cardiac pacemaker: Secondary | ICD-10-CM | POA: Diagnosis not present

## 2023-06-02 DIAGNOSIS — F419 Anxiety disorder, unspecified: Secondary | ICD-10-CM | POA: Insufficient documentation

## 2023-06-02 DIAGNOSIS — Z7952 Long term (current) use of systemic steroids: Secondary | ICD-10-CM | POA: Insufficient documentation

## 2023-06-02 DIAGNOSIS — F32A Depression, unspecified: Secondary | ICD-10-CM | POA: Insufficient documentation

## 2023-06-02 DIAGNOSIS — Z7901 Long term (current) use of anticoagulants: Secondary | ICD-10-CM | POA: Insufficient documentation

## 2023-06-02 DIAGNOSIS — Z930 Tracheostomy status: Secondary | ICD-10-CM | POA: Diagnosis not present

## 2023-06-02 DIAGNOSIS — Z79899 Other long term (current) drug therapy: Secondary | ICD-10-CM | POA: Diagnosis not present

## 2023-06-02 DIAGNOSIS — N185 Chronic kidney disease, stage 5: Secondary | ICD-10-CM | POA: Insufficient documentation

## 2023-06-02 DIAGNOSIS — J449 Chronic obstructive pulmonary disease, unspecified: Secondary | ICD-10-CM | POA: Diagnosis not present

## 2023-06-02 DIAGNOSIS — I083 Combined rheumatic disorders of mitral, aortic and tricuspid valves: Secondary | ICD-10-CM | POA: Insufficient documentation

## 2023-06-02 DIAGNOSIS — I132 Hypertensive heart and chronic kidney disease with heart failure and with stage 5 chronic kidney disease, or end stage renal disease: Secondary | ICD-10-CM | POA: Diagnosis not present

## 2023-06-02 DIAGNOSIS — K219 Gastro-esophageal reflux disease without esophagitis: Secondary | ICD-10-CM | POA: Diagnosis not present

## 2023-06-02 DIAGNOSIS — I509 Heart failure, unspecified: Secondary | ICD-10-CM

## 2023-06-02 DIAGNOSIS — G4730 Sleep apnea, unspecified: Secondary | ICD-10-CM | POA: Insufficient documentation

## 2023-06-02 DIAGNOSIS — F0390 Unspecified dementia without behavioral disturbance: Secondary | ICD-10-CM | POA: Insufficient documentation

## 2023-06-02 DIAGNOSIS — I4821 Permanent atrial fibrillation: Secondary | ICD-10-CM | POA: Diagnosis not present

## 2023-06-02 DIAGNOSIS — Z87891 Personal history of nicotine dependence: Secondary | ICD-10-CM

## 2023-06-02 HISTORY — PX: AV FISTULA PLACEMENT: SHX1204

## 2023-06-02 HISTORY — DX: Chronic obstructive pulmonary disease, unspecified: J44.9

## 2023-06-02 HISTORY — DX: Cerebral infarction, unspecified: I63.9

## 2023-06-02 HISTORY — DX: Unspecified dementia, unspecified severity, without behavioral disturbance, psychotic disturbance, mood disturbance, and anxiety: F03.90

## 2023-06-02 LAB — POCT I-STAT, CHEM 8
BUN: 51 mg/dL — ABNORMAL HIGH (ref 8–23)
Calcium, Ion: 1.23 mmol/L (ref 1.15–1.40)
Chloride: 117 mmol/L — ABNORMAL HIGH (ref 98–111)
Creatinine, Ser: 4.7 mg/dL — ABNORMAL HIGH (ref 0.44–1.00)
Glucose, Bld: 105 mg/dL — ABNORMAL HIGH (ref 70–99)
HCT: 31 % — ABNORMAL LOW (ref 36.0–46.0)
Hemoglobin: 10.5 g/dL — ABNORMAL LOW (ref 12.0–15.0)
Potassium: 5 mmol/L (ref 3.5–5.1)
Sodium: 144 mmol/L (ref 135–145)
TCO2: 20 mmol/L — ABNORMAL LOW (ref 22–32)

## 2023-06-02 LAB — GLUCOSE, CAPILLARY: Glucose-Capillary: 100 mg/dL — ABNORMAL HIGH (ref 70–99)

## 2023-06-02 SURGERY — INSERTION OF ARTERIOVENOUS (AV) GORE-TEX GRAFT ARM
Anesthesia: General | Site: Arm Upper | Laterality: Left

## 2023-06-02 MED ORDER — LACTATED RINGERS IV SOLN: INTRAVENOUS | Status: DC

## 2023-06-02 MED ORDER — ONDANSETRON HCL 4 MG/2ML IJ SOLN
INTRAMUSCULAR | Status: DC | PRN
  Administered 2023-06-02: 4 mg via INTRAVENOUS

## 2023-06-02 MED ORDER — PROTAMINE SULFATE 10 MG/ML IV SOLN
INTRAVENOUS | Status: AC
  Filled 2023-06-02: qty 10

## 2023-06-02 MED ORDER — LIDOCAINE 2% (20 MG/ML) 5 ML SYRINGE
INTRAMUSCULAR | Status: DC | PRN
  Administered 2023-06-02: 20 mg via INTRAVENOUS

## 2023-06-02 MED ORDER — ROCURONIUM BROMIDE 10 MG/ML (PF) SYRINGE
PREFILLED_SYRINGE | INTRAVENOUS | Status: DC | PRN
  Administered 2023-06-02: 30 mg via INTRAVENOUS

## 2023-06-02 MED ORDER — FENTANYL CITRATE (PF) 250 MCG/5ML IJ SOLN
INTRAMUSCULAR | Status: DC | PRN
  Administered 2023-06-02: 50 ug via INTRAVENOUS

## 2023-06-02 MED ORDER — HEPARIN 6000 UNIT IRRIGATION SOLUTION
Status: DC | PRN
  Administered 2023-06-02: 1

## 2023-06-02 MED ORDER — SUGAMMADEX SODIUM 200 MG/2ML IV SOLN
INTRAVENOUS | Status: DC | PRN
  Administered 2023-06-02: 107.4 mg via INTRAVENOUS

## 2023-06-02 MED ORDER — HEPARIN 6000 UNIT IRRIGATION SOLUTION
Status: AC
  Filled 2023-06-02: qty 500

## 2023-06-02 MED ORDER — 0.9 % SODIUM CHLORIDE (POUR BTL) OPTIME
TOPICAL | Status: DC | PRN
  Administered 2023-06-02: 1000 mL

## 2023-06-02 MED ORDER — LIDOCAINE-EPINEPHRINE (PF) 1 %-1:200000 IJ SOLN
INTRAMUSCULAR | Status: DC | PRN
  Administered 2023-06-02: 30 mL via INTRADERMAL

## 2023-06-02 MED ORDER — DEXAMETHASONE SODIUM PHOSPHATE 10 MG/ML IJ SOLN
INTRAMUSCULAR | Status: AC
  Filled 2023-06-02: qty 1

## 2023-06-02 MED ORDER — LIDOCAINE 2% (20 MG/ML) 5 ML SYRINGE
INTRAMUSCULAR | Status: AC
  Filled 2023-06-02: qty 5

## 2023-06-02 MED ORDER — PROTAMINE SULFATE 10 MG/ML IV SOLN
INTRAVENOUS | Status: DC | PRN
  Administered 2023-06-02: 25 mg via INTRAVENOUS
  Administered 2023-06-02: 5 mg via INTRAVENOUS

## 2023-06-02 MED ORDER — FENTANYL CITRATE (PF) 100 MCG/2ML IJ SOLN: 25.0000 ug | INTRAMUSCULAR | Status: DC | PRN

## 2023-06-02 MED ORDER — CHLORHEXIDINE GLUCONATE 4 % EX SOLN: 60.0000 mL | Freq: Once | CUTANEOUS | Status: DC

## 2023-06-02 MED ORDER — PHENYLEPHRINE HCL-NACL 20-0.9 MG/250ML-% IV SOLN
INTRAVENOUS | Status: DC | PRN
  Administered 2023-06-02: 50 ug/min via INTRAVENOUS

## 2023-06-02 MED ORDER — SODIUM CHLORIDE 0.9 % IV SOLN: INTRAVENOUS | Status: DC

## 2023-06-02 MED ORDER — HEPARIN SODIUM (PORCINE) 1000 UNIT/ML IJ SOLN
INTRAMUSCULAR | Status: AC
  Filled 2023-06-02: qty 20

## 2023-06-02 MED ORDER — PROPOFOL 10 MG/ML IV BOLUS
INTRAVENOUS | Status: DC | PRN
  Administered 2023-06-02: 70 mg via INTRAVENOUS

## 2023-06-02 MED ORDER — ROCURONIUM BROMIDE 10 MG/ML (PF) SYRINGE
PREFILLED_SYRINGE | INTRAVENOUS | Status: AC
  Filled 2023-06-02: qty 10

## 2023-06-02 MED ORDER — HEPARIN SODIUM (PORCINE) 1000 UNIT/ML IJ SOLN
INTRAMUSCULAR | Status: DC | PRN
  Administered 2023-06-02: 5000 [IU] via INTRAVENOUS

## 2023-06-02 MED ORDER — EPHEDRINE 5 MG/ML INJ
INTRAVENOUS | Status: AC
  Filled 2023-06-02: qty 5

## 2023-06-02 MED ORDER — PHENYLEPHRINE 80 MCG/ML (10ML) SYRINGE FOR IV PUSH (FOR BLOOD PRESSURE SUPPORT)
PREFILLED_SYRINGE | INTRAVENOUS | Status: AC
  Filled 2023-06-02: qty 10

## 2023-06-02 MED ORDER — FENTANYL CITRATE (PF) 250 MCG/5ML IJ SOLN
INTRAMUSCULAR | Status: AC
  Filled 2023-06-02: qty 5

## 2023-06-02 MED ORDER — DEXAMETHASONE SODIUM PHOSPHATE 10 MG/ML IJ SOLN
INTRAMUSCULAR | Status: DC | PRN
  Administered 2023-06-02: 5 mg via INTRAVENOUS

## 2023-06-02 MED ORDER — LIDOCAINE-EPINEPHRINE (PF) 1 %-1:200000 IJ SOLN
INTRAMUSCULAR | Status: AC
  Filled 2023-06-02: qty 30

## 2023-06-02 MED ORDER — ORAL CARE MOUTH RINSE: 15.0000 mL | Freq: Once | OROMUCOSAL | Status: AC

## 2023-06-02 MED ORDER — CHLORHEXIDINE GLUCONATE 0.12 % MT SOLN
15.0000 mL | Freq: Once | OROMUCOSAL | Status: AC
  Administered 2023-06-02: 15 mL via OROMUCOSAL
  Filled 2023-06-02: qty 15

## 2023-06-02 MED ORDER — TRAMADOL HCL 50 MG PO TABS: 50.0000 mg | ORAL_TABLET | Freq: Four times a day (QID) | ORAL | 0 refills | Status: DC | PRN

## 2023-06-02 MED ORDER — ONDANSETRON HCL 4 MG/2ML IJ SOLN
INTRAMUSCULAR | Status: AC
  Filled 2023-06-02: qty 4

## 2023-06-02 MED ORDER — PROPOFOL 10 MG/ML IV BOLUS
INTRAVENOUS | Status: AC
  Filled 2023-06-02: qty 20

## 2023-06-02 MED ORDER — PAPAVERINE HCL 30 MG/ML IJ SOLN
INTRAMUSCULAR | Status: AC
  Filled 2023-06-02: qty 2

## 2023-06-02 MED ORDER — ARTIFICIAL TEARS OPHTHALMIC OINT
TOPICAL_OINTMENT | OPHTHALMIC | Status: AC
  Filled 2023-06-02: qty 3.5

## 2023-06-02 MED ORDER — CEFAZOLIN SODIUM-DEXTROSE 2-4 GM/100ML-% IV SOLN
2.0000 g | INTRAVENOUS | Status: AC
  Administered 2023-06-02: 2 g via INTRAVENOUS
  Filled 2023-06-02: qty 100

## 2023-06-02 MED ORDER — ACETAMINOPHEN 500 MG PO TABS
1000.0000 mg | ORAL_TABLET | Freq: Once | ORAL | Status: AC
  Administered 2023-06-02: 1000 mg via ORAL
  Filled 2023-06-02: qty 2

## 2023-06-02 SURGICAL SUPPLY — 38 items
ADH SKN CLS APL DERMABOND .7 (GAUZE/BANDAGES/DRESSINGS) ×1
ARMBAND PINK RESTRICT EXTREMIT (MISCELLANEOUS) ×2 IMPLANT
BAG COUNTER SPONGE SURGICOUNT (BAG) ×1 IMPLANT
BAG SPNG CNTER NS LX DISP (BAG) ×1
CANISTER SUCT 3000ML PPV (MISCELLANEOUS) ×1 IMPLANT
CANNULA VESSEL 3MM 2 BLNT TIP (CANNULA) ×1 IMPLANT
CLIP TI MEDIUM 6 (CLIP) ×1 IMPLANT
CLIP TI WIDE RED SMALL 6 (CLIP) ×1 IMPLANT
DERMABOND ADVANCED .7 DNX12 (GAUZE/BANDAGES/DRESSINGS) ×1 IMPLANT
ELECT REM PT RETURN 9FT ADLT (ELECTROSURGICAL) ×1
ELECTRODE REM PT RTRN 9FT ADLT (ELECTROSURGICAL) ×1 IMPLANT
GLOVE BIO SURGEON STRL SZ7.5 (GLOVE) ×1 IMPLANT
GLOVE BIOGEL PI IND STRL 8 (GLOVE) ×1 IMPLANT
GLOVE SURG POLY ORTHO LF SZ7.5 (GLOVE) IMPLANT
GLOVE SURG UNDER LTX SZ8 (GLOVE) ×1 IMPLANT
GOWN STRL REUS W/ TWL LRG LVL3 (GOWN DISPOSABLE) ×3 IMPLANT
GOWN STRL REUS W/TWL LRG LVL3 (GOWN DISPOSABLE) ×3
GRAFT GORETEX STRT 4-7X45 (Vascular Products) IMPLANT
KIT BASIN OR (CUSTOM PROCEDURE TRAY) ×1 IMPLANT
KIT TURNOVER KIT B (KITS) ×1 IMPLANT
LOOP VASCULAR MINI 18 RED (MISCELLANEOUS) ×1
LOOP VESSEL MAXI BLUE 1X16 (MISCELLANEOUS) IMPLANT
NS IRRIG 1000ML POUR BTL (IV SOLUTION) ×1 IMPLANT
PACK CV ACCESS (CUSTOM PROCEDURE TRAY) ×1 IMPLANT
PAD ARMBOARD 7.5X6 YLW CONV (MISCELLANEOUS) ×2 IMPLANT
SLING ARM FOAM STRAP LRG (SOFTGOODS) IMPLANT
SLING ARM FOAM STRAP MED (SOFTGOODS) IMPLANT
SPIKE FLUID TRANSFER (MISCELLANEOUS) ×1 IMPLANT
SPONGE SURGIFOAM ABS GEL 100 (HEMOSTASIS) IMPLANT
SUT MNCRL AB 4-0 PS2 18 (SUTURE) ×2 IMPLANT
SUT PROLENE 6 0 BV (SUTURE) ×2 IMPLANT
SUT VIC AB 3-0 SH 27 (SUTURE) ×2
SUT VIC AB 3-0 SH 27X BRD (SUTURE) ×2 IMPLANT
SYR TOOMEY 50ML (SYRINGE) IMPLANT
TOWEL GREEN STERILE (TOWEL DISPOSABLE) ×1 IMPLANT
UNDERPAD 30X36 HEAVY ABSORB (UNDERPADS AND DIAPERS) ×1 IMPLANT
VASCULAR TIE MINI RED 18IN STL (MISCELLANEOUS) IMPLANT
WATER STERILE IRR 1000ML POUR (IV SOLUTION) ×1 IMPLANT

## 2023-06-02 NOTE — Op Note (Signed)
    NAME: Ednita Ousley    MRN: [id number] DOB: 03-30-1946    DATE OF OPERATION: 06/02/2023  PREOP DIAGNOSIS:    Stage V chronic kidney disease  POSTOP DIAGNOSIS:    Same  PROCEDURE:    New left upper arm loop AV graft (4-7 mm PTFE graft)  SURGEON: Di Kindle. Edilia Bo, MD  ASSIST: Graceann Congress, PA  ANESTHESIA: General  EBL: Minimal  INDICATIONS:    [name] is a 77 y.o. female who is not yet on dialysis.  She had an attempted fistula that failed.  She has a high bifurcation of her brachial artery.  She presents for an upper arm loop graft at the request of nephrology.  FINDINGS:   She had a large plexus of veins in her axilla I selected the dominant outflow vein.  The artery at this level was good size.  At the completion of the procedure there was an excellent thrill in the graft and a palpable radial pulse.  TECHNIQUE:   Patient was taken to the operating room and received a general anesthetic.  The left arm was prepped and draped in usual sterile fashion.  A transverse incision was made just below the axilla.  The dissection was carried down to the axillary vein.  Of note there was a large plexus of veins.  I selected the dominant outflow vein.  Deep to this the brachial artery was dissected free where it bifurcated into 2 large branches.  These were controlled.  Using 1 distal counterincision a 4-7 mm PTFE graft was tunneled in a loop fashion in the left upper arm with the arterial aspect of the graft along the lateral aspect of the arm.  The patient was heparinized.  The artery was clamped proximally distally and a longitudinal arteriotomy was made.  A short segment of the 4 mm end of the graft is excised, the graft spatulated and sewn into side to the artery using continuous 6-0 Prolene suture.  The graft then pulled the appropriate length for anastomosis to the vein.  The vein was ligated distally and divided it was then spatulated.  The graft was sewn into into  the vein using continuous 6-0 Prolene suture.  At the completion there was an excellent thrill in the graft.  There was a palpable radial pulse.  The heparin was partially reversed with protamine.  The wounds were each closed with a deep layer of 3-0 Vicryl and the skin closed with 4-0 Monocryl.  Dermabond was applied.  The patient tolerated the procedure well was transferred to the recovery room in stable condition.  All needle and sponge counts were correct.  Given the complexity of the case,  the assistant was necessary in order to expedient the procedure and safely perform the technical aspects of the operation.  The assistant provided traction and countertraction to assist with exposure of the artery and vein.  They also assisted with suture ligation of multiple venous branches. They also assisted with tunneling of the graft.  They played a critical role for both anastomoses.. These skills, especially following the Prolene suture for the anastomosis, could not have been adequately performed by a scrub tech assistant.    [name], MD, FACS [facility]  DATE OF DICTATION:   06/02/2023

## 2023-06-02 NOTE — Discharge Instructions (Signed)
[name]  Discharge Instructions  AV Fistula or Graft Surgery for Dialysis Access  Please refer to the following instructions for your post-procedure care. Your surgeon or physician assistant will discuss any changes with you.  Activity  You may drive the day following your surgery, if you are comfortable and no longer taking prescription pain medication. Resume full activity as the soreness in your incision resolves.  Bathing/Showering  You may shower after you go home. Keep your incision dry for 48 hours. Do not soak in a bathtub, hot tub, or swim until the incision heals completely. You may not shower if you have a hemodialysis catheter.  Incision Care  Clean your incision with mild soap and water after 48 hours. Pat the area dry with a clean towel. You do not need a bandage unless otherwise instructed. Do not apply any ointments or creams to your incision. You may have skin glue on your incision. Do not peel it off. It will come off on its own in about one week. Your arm may swell a bit after surgery. To reduce swelling use pillows to elevate your arm so it is above your heart. Your doctor will tell you if you need to lightly wrap your arm with an ACE bandage.  Diet  Resume your normal diet. There are not special food restrictions following this procedure. In order to heal from your surgery, it is CRITICAL to get adequate nutrition. Your body requires vitamins, minerals, and protein. Vegetables are the best source of vitamins and minerals. Vegetables also provide the perfect balance of protein. Processed food has little nutritional value, so try to avoid this.  Medications  Resume taking all of your medications. If your incision is causing pain, you may take over-the counter pain relievers such as acetaminophen (Tylenol). If you were prescribed a stronger pain medication, please be aware these medications can cause nausea and constipation. Prevent  nausea by taking the medication with a snack or meal. Avoid constipation by drinking plenty of fluids and eating foods with high amount of fiber, such as fruits, vegetables, and grains.  Do not take Tylenol if you are taking prescription pain medications.  Follow up Your surgeon may want to see you in the office following your access surgery. If so, this will be arranged at the time of your surgery.  Please call us immediately for any of the following conditions:  Increased pain, redness, drainage (pus) from your incision site Fever of 101 degrees or higher Severe or worsening pain at your incision site Hand pain or numbness.  Reduce your risk of vascular disease:  Stop smoking. If you would like help, call [facility] at 1-800-QUIT-NOW ((423) 483-7728) or [place] at 6073307012  Manage your cholesterol Maintain a desired weight Control your diabetes Keep your blood pressure down  Dialysis  It will take several weeks to several months for your new dialysis access to be ready for use. Your surgeon will determine when it is okay to use it. Your nephrologist will continue to direct your dialysis. You can continue to use your Permcath until your new access is ready for use.   06/02/2023 Darika Stubenrauch [id number] [birth year]  Surgeon(s): Chuck Hint, MD  Procedure(s): INSERTION OF LEFT UPPER ARM ARTERIOVENOUS (AV) GORE-TEX GRAFT   May stick graft immediately   May stick graft on designated area only:   X Do not stick left AV graft for 4 weeks    If you have any questions, please call  the office at [phone].

## 2023-06-02 NOTE — Anesthesia Postprocedure Evaluation (Signed)
Anesthesia Post Note  Patient: [name]  Procedure(s) Performed: INSERTION OF LEFT UPPER ARM ARTERIOVENOUS (AV) GORE-TEX GRAFT (Left: Arm Upper)     Patient location during evaluation: PACU Anesthesia Type: General Level of consciousness: awake and alert Pain management: pain level controlled Vital Signs Assessment: post-procedure vital signs reviewed and stable Respiratory status: spontaneous breathing, nonlabored ventilation, respiratory function stable and patient connected to nasal cannula oxygen Cardiovascular status: blood pressure returned to baseline and stable Postop Assessment: no apparent nausea or vomiting Anesthetic complications: no  No notable events documented.  Last Vitals:  Vitals:   06/02/23 1215 06/02/23 1230  BP: 106/67 99/65  Pulse: 80 70  Resp: 17 13  Temp:  36.6 C  SpO2: 98% 99%    Last Pain:  Vitals:   06/02/23 1230  TempSrc:   PainSc: 0-No pain                 Kecia Swoboda L Kindsey Eblin

## 2023-06-02 NOTE — Transfer of Care (Signed)
Immediate Anesthesia Transfer of Care Note  Patient: [name]  Procedure(s) Performed: INSERTION OF LEFT UPPER ARM ARTERIOVENOUS (AV) GORE-TEX GRAFT (Left: Arm Upper)  Patient Location: PACU  Anesthesia Type:General  Level of Consciousness: awake  Airway & Oxygen Therapy: Patient Spontanous Breathing and Patient connected to tracheostomy mask oxygen  Post-op Assessment: Report given to RN and Post -op Vital signs reviewed and stable  Post vital signs: Reviewed and stable  Last Vitals:  Vitals Value Taken Time  BP 122/57 06/02/23 1151  Temp    Pulse 80 06/02/23 1156  Resp 17 06/02/23 1156  SpO2 100 % 06/02/23 1156  Vitals shown include unvalidated device data.  Last Pain:  Vitals:   06/02/23 0841  TempSrc:   PainSc: 0-No pain         Complications: No notable events documented.

## 2023-06-02 NOTE — Anesthesia Procedure Notes (Addendum)
Procedure Name: Intubation Date/Time: 06/02/2023 10:07 AM  Performed by: Loleta Alphonza Tramell, CRNAPre-anesthesia Checklist: Patient identified, Patient being monitored, Timeout performed, Emergency Drugs available and Suction available Patient Re-evaluated:Patient Re-evaluated prior to induction Oxygen Delivery Method: Circle system utilized Preoxygenation: Pre-oxygenation with 100% oxygen Induction Type: IV induction and Tracheostomy Tube type: Reinforced Tube size: 5.5 mm Number of attempts: 1 Airway Equipment and Method: Tracheostomy Placement Confirmation: positive ETCO2 and breath sounds checked- equal and bilateral Secured at: 0 cm Tube secured with: Tape Dental Injury: Teeth and Oropharynx as per pre-operative assessment  Comments: Patient has a 5.5 mm Shiley cuffless trach. Replaced with 5.5 Reinforced ETT. Atraumatic.

## 2023-06-02 NOTE — Interval H&P Note (Signed)
History and Physical Interval Note:  06/02/2023 9:30 AM  [name]  has presented today for surgery, with the diagnosis of CKD V.  The various methods of treatment have been discussed with the patient and family. After consideration of risks, benefits and other options for treatment, the patient has consented to  Procedure(s): INSERTION OF LEFT UPPER ARM ARTERIOVENOUS (AV) GORE-TEX GRAFT (Left) as a surgical intervention.  The patient's history has been reviewed, patient examined, no change in status, stable for surgery.  I have reviewed the patient's chart and labs.  Questions were answered to the patient's satisfaction.     [name]

## 2023-06-03 ENCOUNTER — Encounter (HOSPITAL_COMMUNITY): Payer: Self-pay | Admitting: Vascular Surgery

## 2023-06-25 ENCOUNTER — Ambulatory Visit (INDEPENDENT_AMBULATORY_CARE_PROVIDER_SITE_OTHER): Payer: Medicare (Managed Care)

## 2023-06-25 VITALS — BP 102/66 | HR 67 | Temp 98.4°F | Resp 20 | Ht 65.0 in | Wt 111.0 lb

## 2023-06-25 DIAGNOSIS — N185 Chronic kidney disease, stage 5: Secondary | ICD-10-CM

## 2023-06-25 NOTE — Progress Notes (Signed)
POST OPERATIVE OFFICE NOTE    CC:  F/u for surgery  HPI:  This is a 77 y.o. female who is s/p left UE av graft on 06/02/23 by Dr. Edilia Bo.    Pt returns today for follow up.  Pt states she has no pain, loss of motor or loss of sensation in the left UE.  She is CKD stage V and not currently requiring HD.     Allergies  Allergen Reactions   Ace Inhibitors Swelling    angiodema   Other Anaphylaxis and Swelling    Reaction to unspecified anesthesia    Propofol     Dysrhythmia likely to drop in afterload post induction with Propofol. Did not have anaphylactic symptoms with MAC in 2018    Current Outpatient Medications  Medication Sig Dispense Refill   acetaminophen (TYLENOL) 650 MG CR tablet Take 650 mg by mouth every 8 (eight) hours as needed for pain.      albuterol (PROVENTIL HFA;VENTOLIN HFA) 108 (90 Base) MCG/ACT inhaler Inhale 1-2 puffs into the lungs every 6 (six) hours as needed for wheezing or shortness of breath. 1 Inhaler 6   apixaban (ELIQUIS) 2.5 MG TABS tablet Take 1 tablet (2.5 mg total) by mouth 2 (two) times daily. 60 tablet 1   bimatoprost (LUMIGAN) 0.01 % SOLN Place 1 drop into both eyes at bedtime.     brimonidine (ALPHAGAN) 0.2 % ophthalmic solution Place 1 drop into the right eye in the morning and at bedtime.     donepezil (ARICEPT) 10 MG tablet Take 10 mg by mouth at bedtime.     Epoetin Alfa-epbx (RETACRIT IJ) Inject 1 Dose as directed once a week.      ferrous sulfate 325 (65 FE) MG tablet TAKE 1 TABLET BY MOUTH TWICE DAILY WITH MEALS TO KEEP BLOOD COUNT UP (Patient taking differently: Take 325 mg by mouth daily.) 60 tablet 11   guaiFENesin (MUCINEX) 600 MG 12 hr tablet Take 600 mg by mouth every 12 (twelve) hours as needed (congestion.).     ipratropium-albuterol (DUONEB) 0.5-2.5 (3) MG/3ML SOLN USE 3 ML VIA NEBULIZER EVERY 6 HOURS (Patient taking differently: Take 3 mLs by nebulization every 6 (six) hours as needed (shortness of breath/wheezing.).) 360 mL 0    Menthol, Topical Analgesic, (BIOFREEZE EX) Apply 1 Application topically 2 (two) times daily as needed (aches/pain.).     metoprolol succinate (TOPROL XL) 25 MG 24 hr tablet Take 0.5 tablets (12.5 mg total) by mouth daily. 45 tablet 1   mirtazapine (REMERON) 7.5 MG tablet Take 7.5 mg by mouth at bedtime.     nitroGLYCERIN (NITROSTAT) 0.4 MG SL tablet Place 1 tablet (0.4 mg total) under the tongue every 5 (five) minutes as needed for chest pain. 25 tablet 1   Nutritional Supplements (ENSURE PLUS PO) Take 237 mLs by mouth in the morning, at noon, and at bedtime.     potassium chloride SA (KLOR-CON M) 20 MEQ tablet Take 20 mEq by mouth daily.     predniSONE (DELTASONE) 5 MG tablet Take 5 mg by mouth daily.     rosuvastatin (CRESTOR) 40 MG tablet Take 1 tablet (40 mg total) by mouth at bedtime. 90 tablet 3   STIMULANT LAXATIVE 8.6-50 MG tablet Take 2 tablets by mouth at bedtime.     torsemide (DEMADEX) 20 MG tablet Take 2 tablets (40 mg total) by mouth daily. 90 tablet 3   traMADol (ULTRAM) 50 MG tablet Take 1 tablet (50 mg total) by mouth every  6 (six) hours as needed for moderate pain. 20 tablet 0   No current facility-administered medications for this visit.     ROS:  See HPI  Physical Exam:    Incision:  well healed Extremities:  motor and sensation Intact Neuro: no neurologic deficits     Assessment/Plan:  This is a 77 y.o. female who is s/p: Left UE AV graft placement on 06/01/23 by Dr. Edilia Bo.  Good thrill and well healed incisions.  If the graft is needed it can be used as of 07/02/23.  F/U PRN is she has issues with her graft.     Mosetta Pigeon PA-C [facility] [phone]   Call MD:  Chestine Spore

## 2023-07-07 ENCOUNTER — Other Ambulatory Visit: Payer: Self-pay

## 2023-07-07 ENCOUNTER — Inpatient Hospital Stay (HOSPITAL_COMMUNITY)
Admission: EM | Admit: 2023-07-07 | Discharge: 2023-08-16 | DRG: 377 | Disposition: E | Payer: Medicare (Managed Care) | Attending: Internal Medicine | Admitting: Internal Medicine

## 2023-07-07 ENCOUNTER — Emergency Department (HOSPITAL_COMMUNITY): Payer: Medicare (Managed Care)

## 2023-07-07 DIAGNOSIS — E43 Unspecified severe protein-calorie malnutrition: Secondary | ICD-10-CM | POA: Diagnosis present

## 2023-07-07 DIAGNOSIS — J4489 Other specified chronic obstructive pulmonary disease: Secondary | ICD-10-CM | POA: Diagnosis present

## 2023-07-07 DIAGNOSIS — J386 Stenosis of larynx: Secondary | ICD-10-CM | POA: Diagnosis present

## 2023-07-07 DIAGNOSIS — G9341 Metabolic encephalopathy: Secondary | ICD-10-CM | POA: Diagnosis present

## 2023-07-07 DIAGNOSIS — K219 Gastro-esophageal reflux disease without esophagitis: Secondary | ICD-10-CM | POA: Diagnosis present

## 2023-07-07 DIAGNOSIS — Z758 Other problems related to medical facilities and other health care: Secondary | ICD-10-CM

## 2023-07-07 DIAGNOSIS — Q2733 Arteriovenous malformation of digestive system vessel: Secondary | ICD-10-CM | POA: Diagnosis not present

## 2023-07-07 DIAGNOSIS — E871 Hypo-osmolality and hyponatremia: Secondary | ICD-10-CM | POA: Diagnosis not present

## 2023-07-07 DIAGNOSIS — Z515 Encounter for palliative care: Secondary | ICD-10-CM

## 2023-07-07 DIAGNOSIS — Z681 Body mass index (BMI) 19 or less, adult: Secondary | ICD-10-CM

## 2023-07-07 DIAGNOSIS — Z66 Do not resuscitate: Secondary | ICD-10-CM | POA: Diagnosis present

## 2023-07-07 DIAGNOSIS — R0603 Acute respiratory distress: Secondary | ICD-10-CM | POA: Diagnosis present

## 2023-07-07 DIAGNOSIS — Z87891 Personal history of nicotine dependence: Secondary | ICD-10-CM

## 2023-07-07 DIAGNOSIS — R627 Adult failure to thrive: Secondary | ICD-10-CM | POA: Diagnosis not present

## 2023-07-07 DIAGNOSIS — I4821 Permanent atrial fibrillation: Secondary | ICD-10-CM | POA: Diagnosis not present

## 2023-07-07 DIAGNOSIS — Z8249 Family history of ischemic heart disease and other diseases of the circulatory system: Secondary | ICD-10-CM

## 2023-07-07 DIAGNOSIS — F03A0 Unspecified dementia, mild, without behavioral disturbance, psychotic disturbance, mood disturbance, and anxiety: Secondary | ICD-10-CM | POA: Diagnosis present

## 2023-07-07 DIAGNOSIS — Z930 Tracheostomy status: Secondary | ICD-10-CM

## 2023-07-07 DIAGNOSIS — Z950 Presence of cardiac pacemaker: Secondary | ICD-10-CM

## 2023-07-07 DIAGNOSIS — F0390 Unspecified dementia without behavioral disturbance: Secondary | ICD-10-CM | POA: Diagnosis not present

## 2023-07-07 DIAGNOSIS — I4891 Unspecified atrial fibrillation: Secondary | ICD-10-CM | POA: Diagnosis not present

## 2023-07-07 DIAGNOSIS — G9340 Encephalopathy, unspecified: Secondary | ICD-10-CM | POA: Diagnosis present

## 2023-07-07 DIAGNOSIS — I69320 Aphasia following cerebral infarction: Secondary | ICD-10-CM

## 2023-07-07 DIAGNOSIS — N179 Acute kidney failure, unspecified: Secondary | ICD-10-CM | POA: Diagnosis not present

## 2023-07-07 DIAGNOSIS — Z992 Dependence on renal dialysis: Secondary | ICD-10-CM

## 2023-07-07 DIAGNOSIS — K921 Melena: Secondary | ICD-10-CM | POA: Diagnosis not present

## 2023-07-07 DIAGNOSIS — G4730 Sleep apnea, unspecified: Secondary | ICD-10-CM | POA: Diagnosis present

## 2023-07-07 DIAGNOSIS — K760 Fatty (change of) liver, not elsewhere classified: Secondary | ICD-10-CM | POA: Diagnosis not present

## 2023-07-07 DIAGNOSIS — K31811 Angiodysplasia of stomach and duodenum with bleeding: Secondary | ICD-10-CM | POA: Diagnosis not present

## 2023-07-07 DIAGNOSIS — Z7901 Long term (current) use of anticoagulants: Secondary | ICD-10-CM

## 2023-07-07 DIAGNOSIS — Z7952 Long term (current) use of systemic steroids: Secondary | ICD-10-CM

## 2023-07-07 DIAGNOSIS — I953 Hypotension of hemodialysis: Secondary | ICD-10-CM | POA: Diagnosis present

## 2023-07-07 DIAGNOSIS — Z7189 Other specified counseling: Secondary | ICD-10-CM | POA: Diagnosis not present

## 2023-07-07 DIAGNOSIS — D62 Acute posthemorrhagic anemia: Secondary | ICD-10-CM | POA: Diagnosis present

## 2023-07-07 DIAGNOSIS — I132 Hypertensive heart and chronic kidney disease with heart failure and with stage 5 chronic kidney disease, or end stage renal disease: Secondary | ICD-10-CM | POA: Diagnosis present

## 2023-07-07 DIAGNOSIS — F17210 Nicotine dependence, cigarettes, uncomplicated: Secondary | ICD-10-CM | POA: Diagnosis present

## 2023-07-07 DIAGNOSIS — R195 Other fecal abnormalities: Secondary | ICD-10-CM | POA: Diagnosis not present

## 2023-07-07 DIAGNOSIS — E1122 Type 2 diabetes mellitus with diabetic chronic kidney disease: Secondary | ICD-10-CM | POA: Diagnosis present

## 2023-07-07 DIAGNOSIS — M1990 Unspecified osteoarthritis, unspecified site: Secondary | ICD-10-CM | POA: Diagnosis present

## 2023-07-07 DIAGNOSIS — K922 Gastrointestinal hemorrhage, unspecified: Secondary | ICD-10-CM | POA: Diagnosis present

## 2023-07-07 DIAGNOSIS — Z833 Family history of diabetes mellitus: Secondary | ICD-10-CM

## 2023-07-07 DIAGNOSIS — I442 Atrioventricular block, complete: Secondary | ICD-10-CM | POA: Diagnosis not present

## 2023-07-07 DIAGNOSIS — I422 Other hypertrophic cardiomyopathy: Secondary | ICD-10-CM | POA: Diagnosis not present

## 2023-07-07 DIAGNOSIS — Z888 Allergy status to other drugs, medicaments and biological substances status: Secondary | ICD-10-CM

## 2023-07-07 DIAGNOSIS — G2581 Restless legs syndrome: Secondary | ICD-10-CM | POA: Diagnosis not present

## 2023-07-07 DIAGNOSIS — L89152 Pressure ulcer of sacral region, stage 2: Secondary | ICD-10-CM | POA: Diagnosis not present

## 2023-07-07 DIAGNOSIS — D649 Anemia, unspecified: Secondary | ICD-10-CM | POA: Diagnosis present

## 2023-07-07 DIAGNOSIS — Z79899 Other long term (current) drug therapy: Secondary | ICD-10-CM

## 2023-07-07 DIAGNOSIS — D631 Anemia in chronic kidney disease: Secondary | ICD-10-CM | POA: Diagnosis present

## 2023-07-07 DIAGNOSIS — Z23 Encounter for immunization: Secondary | ICD-10-CM

## 2023-07-07 DIAGNOSIS — N186 End stage renal disease: Secondary | ICD-10-CM | POA: Diagnosis not present

## 2023-07-07 DIAGNOSIS — E875 Hyperkalemia: Secondary | ICD-10-CM | POA: Diagnosis present

## 2023-07-07 DIAGNOSIS — Z811 Family history of alcohol abuse and dependence: Secondary | ICD-10-CM

## 2023-07-07 DIAGNOSIS — R636 Underweight: Secondary | ICD-10-CM | POA: Diagnosis present

## 2023-07-07 DIAGNOSIS — I5032 Chronic diastolic (congestive) heart failure: Secondary | ICD-10-CM | POA: Diagnosis not present

## 2023-07-07 DIAGNOSIS — I252 Old myocardial infarction: Secondary | ICD-10-CM

## 2023-07-07 DIAGNOSIS — K5520 Angiodysplasia of colon without hemorrhage: Secondary | ICD-10-CM | POA: Diagnosis present

## 2023-07-07 DIAGNOSIS — E785 Hyperlipidemia, unspecified: Secondary | ICD-10-CM | POA: Diagnosis present

## 2023-07-07 DIAGNOSIS — R54 Age-related physical debility: Secondary | ICD-10-CM | POA: Diagnosis present

## 2023-07-07 DIAGNOSIS — Z823 Family history of stroke: Secondary | ICD-10-CM

## 2023-07-07 LAB — COMPREHENSIVE METABOLIC PANEL
ALT: 39 U/L (ref 0–44)
AST: 167 U/L — ABNORMAL HIGH (ref 15–41)
Albumin: 2.9 g/dL — ABNORMAL LOW (ref 3.5–5.0)
Alkaline Phosphatase: 48 U/L (ref 38–126)
Anion gap: 20 — ABNORMAL HIGH (ref 5–15)
BUN: 119 mg/dL — ABNORMAL HIGH (ref 8–23)
CO2: 10 mmol/L — ABNORMAL LOW (ref 22–32)
Calcium: 8.7 mg/dL — ABNORMAL LOW (ref 8.9–10.3)
Chloride: 105 mmol/L (ref 98–111)
Creatinine, Ser: 7.81 mg/dL — ABNORMAL HIGH (ref 0.44–1.00)
GFR, Estimated: 5 mL/min — ABNORMAL LOW (ref >60)
Glucose, Bld: 150 mg/dL — ABNORMAL HIGH (ref 70–99)
Potassium: 7.1 mmol/L (ref 3.5–5.1)
Sodium: 135 mmol/L (ref 135–145)
Total Bilirubin: 0.5 mg/dL (ref 0.3–1.2)
Total Protein: 6.2 g/dL — ABNORMAL LOW (ref 6.5–8.1)

## 2023-07-07 LAB — TYPE AND SCREEN: Antibody Screen: NEGATIVE

## 2023-07-07 LAB — CBC
HCT: 17.4 % — ABNORMAL LOW (ref 36.0–46.0)
Hemoglobin: 4.7 g/dL — CL (ref 12.0–15.0)
MCH: 25.7 pg — ABNORMAL LOW (ref 26.0–34.0)
MCHC: 27 g/dL — ABNORMAL LOW (ref 30.0–36.0)
MCV: 95.1 fL (ref 80.0–100.0)
Platelets: 285 10*3/uL (ref 150–400)
RBC: 1.83 MIL/uL — ABNORMAL LOW (ref 3.87–5.11)
RDW: 20.2 % — ABNORMAL HIGH (ref 11.5–15.5)
WBC: 13.1 10*3/uL — ABNORMAL HIGH (ref 4.0–10.5)
nRBC: 0.6 % — ABNORMAL HIGH (ref 0.0–0.2)

## 2023-07-07 LAB — CBC WITH DIFFERENTIAL/PLATELET
Abs Immature Granulocytes: 0.09 10*3/uL — ABNORMAL HIGH (ref 0.00–0.07)
Basophils Absolute: 0 10*3/uL (ref 0.0–0.1)
Basophils Relative: 0 %
Eosinophils Absolute: 0 10*3/uL (ref 0.0–0.5)
Eosinophils Relative: 0 %
HCT: 22.9 % — ABNORMAL LOW (ref 36.0–46.0)
Hemoglobin: 5.8 g/dL — CL (ref 12.0–15.0)
Immature Granulocytes: 1 %
Lymphocytes Relative: 1 %
Lymphs Abs: 0.2 10*3/uL — ABNORMAL LOW (ref 0.7–4.0)
MCH: 26.1 pg (ref 26.0–34.0)
MCHC: 25.3 g/dL — ABNORMAL LOW (ref 30.0–36.0)
MCV: 103.2 fL — ABNORMAL HIGH (ref 80.0–100.0)
Monocytes Absolute: 0.5 10*3/uL (ref 0.1–1.0)
Monocytes Relative: 4 %
Neutro Abs: 13 10*3/uL — ABNORMAL HIGH (ref 1.7–7.7)
Neutrophils Relative %: 94 %
Platelets: 304 10*3/uL (ref 150–400)
RBC: 2.22 MIL/uL — ABNORMAL LOW (ref 3.87–5.11)
RDW: 20.3 % — ABNORMAL HIGH (ref 11.5–15.5)
WBC: 13.8 10*3/uL — ABNORMAL HIGH (ref 4.0–10.5)
nRBC: 0.7 % — ABNORMAL HIGH (ref 0.0–0.2)

## 2023-07-07 LAB — RENAL FUNCTION PANEL
Albumin: 2.5 g/dL — ABNORMAL LOW (ref 3.5–5.0)
Anion gap: 19 — ABNORMAL HIGH (ref 5–15)
BUN: 120 mg/dL — ABNORMAL HIGH (ref 8–23)
CO2: 11 mmol/L — ABNORMAL LOW (ref 22–32)
Calcium: 8.5 mg/dL — ABNORMAL LOW (ref 8.9–10.3)
Chloride: 105 mmol/L (ref 98–111)
Creatinine, Ser: 7.74 mg/dL — ABNORMAL HIGH (ref 0.44–1.00)
GFR, Estimated: 5 mL/min — ABNORMAL LOW (ref >60)
Glucose, Bld: 280 mg/dL — ABNORMAL HIGH (ref 70–99)
Phosphorus: 6.7 mg/dL — ABNORMAL HIGH (ref 2.5–4.6)
Potassium: 6.3 mmol/L (ref 3.5–5.1)
Sodium: 135 mmol/L (ref 135–145)

## 2023-07-07 LAB — BPAM RBC
Blood Product Expiration Date: 202408252359
ISSUE DATE / TIME: 202407232026
ISSUE DATE / TIME: 202407232026
Unit Type and Rh: 5100
Unit Type and Rh: 5100

## 2023-07-07 LAB — PREPARE RBC (CROSSMATCH)

## 2023-07-07 LAB — IRON AND TIBC
Iron: 13 ug/dL — ABNORMAL LOW (ref 28–170)
Saturation Ratios: 4 % — ABNORMAL LOW (ref 10.4–31.8)
TIBC: 353 ug/dL (ref 250–450)
UIBC: 340 ug/dL

## 2023-07-07 LAB — HEPATITIS B SURFACE ANTIGEN: Hepatitis B Surface Ag: NONREACTIVE

## 2023-07-07 LAB — LIPASE, BLOOD: Lipase: 52 U/L — ABNORMAL HIGH (ref 11–51)

## 2023-07-07 LAB — POC OCCULT BLOOD, ED: Fecal Occult Bld: POSITIVE — AB

## 2023-07-07 LAB — FERRITIN: Ferritin: 76 ng/mL (ref 11–307)

## 2023-07-07 MED ORDER — ALTEPLASE 2 MG IJ SOLR: 2.0000 mg | Freq: Once | INTRAMUSCULAR | Status: DC | PRN

## 2023-07-07 MED ORDER — PANTOPRAZOLE 80MG IVPB - SIMPLE MED
80.0000 mg | Freq: Once | INTRAVENOUS | Status: DC
  Filled 2023-07-07: qty 100

## 2023-07-07 MED ORDER — CALCIUM GLUCONATE-NACL 1-0.675 GM/50ML-% IV SOLN
1.0000 g | Freq: Once | INTRAVENOUS | Status: AC
  Administered 2023-07-07: 1000 mg via INTRAVENOUS
  Filled 2023-07-07: qty 50

## 2023-07-07 MED ORDER — SODIUM CHLORIDE 0.9% IV SOLUTION: Freq: Once | INTRAVENOUS | Status: DC

## 2023-07-07 MED ORDER — DEXTROSE 50 % IV SOLN
1.0000 | Freq: Once | INTRAVENOUS | Status: AC
  Administered 2023-07-07: 50 mL via INTRAVENOUS
  Filled 2023-07-07: qty 50

## 2023-07-07 MED ORDER — SODIUM BICARBONATE 8.4 % IV SOLN
Freq: Once | INTRAVENOUS | Status: AC
  Filled 2023-07-07: qty 1000

## 2023-07-07 MED ORDER — LIDOCAINE-PRILOCAINE 2.5-2.5 % EX CREA
1.0000 | TOPICAL_CREAM | CUTANEOUS | Status: DC | PRN
  Filled 2023-07-07: qty 5

## 2023-07-07 MED ORDER — HEPARIN SODIUM (PORCINE) 1000 UNIT/ML DIALYSIS
1000.0000 [IU] | INTRAMUSCULAR | Status: DC | PRN
  Filled 2023-07-07: qty 1

## 2023-07-07 MED ORDER — DONEPEZIL HCL 10 MG PO TABS
10.0000 mg | ORAL_TABLET | Freq: Every day | ORAL | Status: DC
  Administered 2023-07-08 – 2023-07-23 (×17): 10 mg via ORAL
  Filled 2023-07-07 (×17): qty 1

## 2023-07-07 MED ORDER — SODIUM ZIRCONIUM CYCLOSILICATE 10 G PO PACK
10.0000 g | PACK | ORAL | Status: DC
  Filled 2023-07-07: qty 1

## 2023-07-07 MED ORDER — SODIUM CHLORIDE 0.9 % IV SOLN: Freq: Once | INTRAVENOUS | Status: AC

## 2023-07-07 MED ORDER — SENNOSIDES-DOCUSATE SODIUM 8.6-50 MG PO TABS
1.0000 | ORAL_TABLET | Freq: Every evening | ORAL | Status: DC | PRN
  Administered 2023-07-21: 1 via ORAL
  Filled 2023-07-07: qty 1

## 2023-07-07 MED ORDER — CHLORHEXIDINE GLUCONATE CLOTH 2 % EX PADS
6.0000 | MEDICATED_PAD | Freq: Every day | CUTANEOUS | Status: DC
  Administered 2023-07-10 – 2023-07-20 (×10): 6 via TOPICAL

## 2023-07-07 MED ORDER — SODIUM ZIRCONIUM CYCLOSILICATE 10 G PO PACK
10.0000 g | PACK | Freq: Three times a day (TID) | ORAL | Status: DC
  Administered 2023-07-08 (×2): 10 g via ORAL
  Filled 2023-07-07 (×2): qty 1

## 2023-07-07 MED ORDER — ACETAMINOPHEN 325 MG PO TABS
650.0000 mg | ORAL_TABLET | Freq: Four times a day (QID) | ORAL | Status: DC | PRN
  Administered 2023-07-13 – 2023-07-24 (×8): 650 mg via ORAL
  Filled 2023-07-07 (×8): qty 2

## 2023-07-07 MED ORDER — ALBUTEROL SULFATE (2.5 MG/3ML) 0.083% IN NEBU
15.0000 mg | INHALATION_SOLUTION | Freq: Once | RESPIRATORY_TRACT | Status: AC
  Administered 2023-07-07: 15 mg via RESPIRATORY_TRACT
  Filled 2023-07-07 (×2): qty 18

## 2023-07-07 MED ORDER — METOPROLOL SUCCINATE ER 25 MG PO TB24
12.5000 mg | ORAL_TABLET | Freq: Every day | ORAL | Status: DC
  Administered 2023-07-08 – 2023-07-20 (×8): 12.5 mg via ORAL
  Filled 2023-07-07 (×11): qty 1

## 2023-07-07 MED ORDER — PENTAFLUOROPROP-TETRAFLUOROETH EX AERO
1.0000 | INHALATION_SPRAY | CUTANEOUS | Status: DC | PRN
  Filled 2023-07-07: qty 116

## 2023-07-07 MED ORDER — FUROSEMIDE 10 MG/ML IJ SOLN
40.0000 mg | Freq: Once | INTRAMUSCULAR | Status: AC
  Administered 2023-07-07: 40 mg via INTRAVENOUS
  Filled 2023-07-07: qty 4

## 2023-07-07 MED ORDER — LIDOCAINE HCL (PF) 1 % IJ SOLN: 5.0000 mL | INTRAMUSCULAR | Status: DC | PRN

## 2023-07-07 MED ORDER — INSULIN ASPART 100 UNIT/ML IV SOLN
5.0000 [IU] | Freq: Once | INTRAVENOUS | Status: AC
  Administered 2023-07-07: 5 [IU] via INTRAVENOUS

## 2023-07-07 MED ORDER — PANTOPRAZOLE INFUSION (NEW) - SIMPLE MED
8.0000 mg/h | INTRAVENOUS | Status: DC
  Administered 2023-07-08 – 2023-07-09 (×3): 8 mg/h via INTRAVENOUS
  Filled 2023-07-07 (×6): qty 100

## 2023-07-07 MED ORDER — MIRTAZAPINE 15 MG PO TABS
7.5000 mg | ORAL_TABLET | Freq: Every day | ORAL | Status: DC
  Administered 2023-07-08 – 2023-07-23 (×16): 7.5 mg via ORAL
  Filled 2023-07-07 (×12): qty 1
  Filled 2023-07-07: qty 0.5
  Filled 2023-07-07 (×5): qty 1

## 2023-07-07 MED ORDER — SODIUM CHLORIDE 0.9 % IV BOLUS
500.0000 mL | Freq: Once | INTRAVENOUS | Status: AC
  Administered 2023-07-07: 500 mL via INTRAVENOUS

## 2023-07-07 MED ORDER — ROSUVASTATIN CALCIUM 20 MG PO TABS
40.0000 mg | ORAL_TABLET | Freq: Every day | ORAL | Status: DC
  Administered 2023-07-08 – 2023-07-23 (×17): 40 mg via ORAL
  Filled 2023-07-07 (×17): qty 2

## 2023-07-07 MED ORDER — ACETAMINOPHEN 650 MG RE SUPP: 650.0000 mg | Freq: Four times a day (QID) | RECTAL | Status: DC | PRN

## 2023-07-07 MED ORDER — ANTICOAGULANT SODIUM CITRATE 4% (200MG/5ML) IV SOLN
5.0000 mL | Status: DC | PRN
  Filled 2023-07-07: qty 5

## 2023-07-07 NOTE — ED Triage Notes (Signed)
Pt BIB [facility] for failure to thrive.  Per family pt has experienced significantly decreased PO intake for the last two weeks. Pt has dementia at baseline, oriented to self and family at this time.  PT has a trach and a pacemaker.  132/7498% 76 CBG 37

## 2023-07-07 NOTE — Hospital Course (Addendum)
[name] is a 77 year-old female who has a PMHx of of complete heart block s/p Abbott PPM in 1986, atrial fibrillation, HTN, aortic atherosclerosis, laryngeal stenosis s/p tracheostomy, CVA 04/2019, GI bleed 2012, AVMs, HCM/chronic congestive heart failure, ESRD s/p fistula, COPD who presented with decreased PO intake and melena found to have acute on chronic anemia and GI bleed. During the day of admission, she was suppose to go to her first session of dialysis but was unable to because she began to worsen. Per daughter who she lives, patient has deviated from her baseline functional status for the last couple weeks and had been unable to care for herself at home with dressing herself, showering, and making food.   #Melena  H/o small bowel AVMs #Angiodysplastic lesions s/p enteroscopy  #Acute on chronic anemia, improving  On admission, her initial Hb dropped to 4.7, K >7, and BUN 120. She was given 3u pRBC with Hb return to baseline at ~10. Hx of chronic anemia in the setting of ESRD, who presented with melena c/f acute blood loss. Also has hx of GI bleed in 2012 and reported small bowel AVMs in 2022 treated with APC. Colonoscopy in 2021 showed multiple polyps in colon that were resected. No cause for anemia was noted in the colon but was thought to be related to small bowel AVM. Enteroscopy on 7/25 showed normal appearing esophagus, 3-4 angiodysplastic lesions located at the stomach, and 2 lesions at duodenum which were all treated with APC. She was started on PPI and later restated on home Eliquis following improvement in her hemoglobin. She later developed multiple episodes of melenic stool with a reduced drop in hemoglobin below 7.0 requiring 2 additional units of pRBCs. Her drop in hemoglobin was suspected to be secondary to ulcers from recent APC. She was started on ESA agent and received two doses. Plan to continue oral PPI BID and carafate. ***complete carafate in 14d      #Poor PO  intake Slowly reintroducing solid foods into her diet. Continue to monitor. Has had 4 lbs weight loss since admission.    -Dietician following -Encourage PO intake  -Feeding supplement  -Monitor daily weights  -Consider involving palliative care for GOC if worsening   #ESRD s/p AV graft fistula #Uremia  Left UE AV graft placement on 06/01/23. Unclear her baseline Cr but likely ~2-3 . Etiology for her renal failure unknown but suspected to be secondary to HTN. Completed 3 of 3 initiation dialysis sessions. PTH levels elevated. Hemodialysis schedule T/Th/Sat. HbsAb noted to show incomplete immunity. HBsAg non-reactive. She received 1st of 2-dose series during hospitalization. She tolerated 4 hours of in chair for consideration of outpatient dialysis      #Dementia  Reported onset of dementia in 2021. Previously able to perform ADLs however over the last few weeks patient has deviated from her baseline and requires the support of her family members to care for her. She is now oriented to person and place. BUN significantly elevated on initial exam likely worsening her mental status. She was maintained on her home donepezil.   #Trach collar, decannulated  H/o laryngeal stenosis  Patient previously required 5 L supplemental O2 through trach collar with copious sputum but was later capped and did not require supplemental O2. ENT was consulted and decannulation was done on 8/1. She has had stable oxygen saturations    Chronic Problems   #Atrial fibrillation #HLD #H/o stroke Reported stroke on 04/2019 with residual expressive aphasia. Afib has since  been rate controlled with Metoprolol succinate 12.5 daily. Last lipid panel showed LDL 55 (05/29/23). Was continued on home Crestor and metoprolol succinate. Eliquis later restarted following improvement in hemoglobin.    #Chronic CHF  HCM  #Complete heart block s/p PPM (1986)  Follows with HF clinic. Home GDMT incldues Metoprolol succinate 12.5mg   daily, Torsemide 40mg  every day. Last ECHO 09/2021 LVEF 40-45%   #HTN: Well-controlled at home. Continued on antihypertensives as stated above

## 2023-07-07 NOTE — ED Provider Notes (Incomplete)
[place] EMERGENCY DEPARTMENT AT [facility] Provider Note   CSN: [id number] Arrival date & time: 07/07/23  1231     History {Add pertinent medical, surgical, social history, OB history to HPI:1} Chief Complaint  Patient presents with  . Failure To Thrive    [name] is a 77 y.o. female.  HPI     Home Medications Prior to Admission medications   Medication Sig Start Date End Date Taking? Authorizing Provider  acetaminophen (TYLENOL) 650 MG CR tablet Take 650 mg by mouth every 8 (eight) hours as needed for pain.     [provider]  albuterol (PROVENTIL HFA;VENTOLIN HFA) 108 (90 Base) MCG/ACT inhaler Inhale 1-2 puffs into the lungs every 6 (six) hours as needed for wheezing or shortness of breath. 08/04/18   Riccio, Marcell Anger, DO  apixaban (ELIQUIS) 2.5 MG TABS tablet Take 1 tablet (2.5 mg total) by mouth 2 (two) times daily. 09/03/21   Duke, Roe Rutherford, PA  bimatoprost (LUMIGAN) 0.01 % SOLN Place 1 drop into both eyes at bedtime.    [provider]  brimonidine (ALPHAGAN) 0.2 % ophthalmic solution Place 1 drop into the right eye in the morning and at bedtime.    [provider]  donepezil (ARICEPT) 10 MG tablet Take 10 mg by mouth at bedtime.    [provider]  Epoetin Alfa-epbx (RETACRIT IJ) Inject 1 Dose as directed once a week.     [provider]  ferrous sulfate 325 (65 FE) MG tablet TAKE 1 TABLET BY MOUTH TWICE DAILY WITH MEALS TO KEEP BLOOD COUNT UP Patient taking differently: Take 325 mg by mouth daily. 08/26/16   Tillman Sers, DO  guaiFENesin (MUCINEX) 600 MG 12 hr tablet Take 600 mg by mouth every 12 (twelve) hours as needed (congestion.).    [provider]  ipratropium-albuterol (DUONEB) 0.5-2.5 (3) MG/3ML SOLN USE 3 ML VIA NEBULIZER EVERY 6 HOURS Patient taking differently: Take 3 mLs by nebulization every 6 (six) hours as needed (shortness of breath/wheezing.). 10/21/18   Tillman Sers, DO   Menthol, Topical Analgesic, (BIOFREEZE EX) Apply 1 Application topically 2 (two) times daily as needed (aches/pain.).    [provider]  metoprolol succinate (TOPROL XL) 25 MG 24 hr tablet Take 0.5 tablets (12.5 mg total) by mouth daily. 08/11/22   Laurey Morale, MD  mirtazapine (REMERON) 7.5 MG tablet Take 7.5 mg by mouth at bedtime.    [provider]  nitroGLYCERIN (NITROSTAT) 0.4 MG SL tablet Place 1 tablet (0.4 mg total) under the tongue every 5 (five) minutes as needed for chest pain. 08/04/18   Tillman Sers, DO  Nutritional Supplements (ENSURE PLUS PO) Take 237 mLs by mouth in the morning, at noon, and at bedtime.    [provider]  potassium chloride SA (KLOR-CON M) 20 MEQ tablet Take 20 mEq by mouth daily.    [provider]  predniSONE (DELTASONE) 5 MG tablet Take 5 mg by mouth daily.    [provider]  rosuvastatin (CRESTOR) 40 MG tablet Take 1 tablet (40 mg total) by mouth at bedtime. 05/29/23   Alver Sorrow, NP  STIMULANT LAXATIVE 8.6-50 MG tablet Take 2 tablets by mouth at bedtime. 11/22/19   [provider]  torsemide (DEMADEX) 20 MG tablet Take 2 tablets (40 mg total) by mouth daily. 05/29/23   Alver Sorrow, NP  traMADol (ULTRAM) 50 MG tablet Take 1 tablet (50 mg total) by mouth every  6 (six) hours as needed for moderate pain. 06/02/23 06/01/24  Baglia, Corrina, PA-C      Allergies    Ace inhibitors, Other, and Propofol    Review of Systems   Review of Systems  Physical Exam Updated Vital Signs BP 115/85   Pulse 70   Temp 97.9 F (36.6 C) (Oral)   Resp 17   SpO2 100%  Physical Exam  ED Results / Procedures / Treatments   Labs (all labs ordered are listed, but only abnormal results are displayed) Labs Reviewed - No data to display  EKG None  Radiology No results found.  Procedures Procedures  {Document cardiac monitor, telemetry assessment procedure when appropriate:1}  Medications Ordered  in ED Medications - No data to display  ED Course/ Medical Decision Making/ A&P   {   Click here for ABCD2, HEART and other calculatorsREFRESH Note before signing :1}                          Medical Decision Making  ***  {Document critical care time when appropriate:1} {Document review of labs and clinical decision tools ie heart score, Chads2Vasc2 etc:1}  {Document your independent review of radiology images, and any outside records:1} {Document your discussion with family members, caretakers, and with consultants:1} {Document social determinants of health affecting pt's care:1} {Document your decision making why or why not admission, treatments were needed:1} Final Clinical Impression(s) / ED Diagnoses Final diagnoses:  None    Rx / DC Orders ED Discharge Orders     None

## 2023-07-07 NOTE — Consult Note (Signed)
[facility]  HISTORY AND PHYSICAL  [name] is an 77 y.o. female.    Chief Complaint: FTT, weakness, AMS  HPI: PT is a 52F with a chronic trach, CKD V, CHF, h/o AVMs of GI tract, DM II, permanent Afib s/p PPM, dementia, and frailty who is now seen in consultation at the request of Dr Jarold Motto of ED for eval and recs of AKI on CKD and multiple metabolic derangements.    History provided by pt's 2 dtrs.  Pt has advanced CKD and is normally followed by Dr Ronalee Belts.  Was slated to have appt today- however pt's dtr called and said that she was feeling too poorly and came to ED.    For past 2 weeks pt has had FTT, decreased appetite, and hasn't been mobile.  She can stand with some assistance but really has been immobile the past 2 weeks.  She also has been disoriented, not making sense.  Baseline Cr is 4.7 (eGFR9).  Now up to 7.8, BUN 119, K 7.1, CO2 10, and Hgb 5.8.  FOBT +.  Had AVG placed 06/02/23.  PMH: Past Medical History:  Diagnosis Date   Anemia    Angioedema    2/2 ACE   Arteriovenous malformation of stomach    Arthritis    Asthma    AVM (arteriovenous malformation) of colon    small intestine; stomach   Blood transfusion    CHF (congestive heart failure) (HCC)    CKD (chronic kidney disease) stage 4, GFR 15-29 ml/min (HCC) 09/24/2010   Baseline Cr 1.3-1.6   Complete heart block (HCC)    s/p PPM 1998   Complication of anesthesia    Difficult airway; anaphylaxis and swelling with propofol   COPD (chronic obstructive pulmonary disease) (HCC)    DDD (degenerative disc disease)    Dementia (HCC)    Depression    Diabetes mellitus, type 2 (HCC)    pt's daughter states pt has never had diabetes   Diastolic heart failure    Fatty liver 07/26/2010   GERD (gastroesophageal reflux disease)    GI bleed    History of alcohol abuse Stopped Fall 2012   History of tobacco use Quit Fall 2012   Hx of cardiovascular stress test    a. Lexiscan Myoview (10/15):   Small inferolateral and apical defect c/w scar and poss soft tissue attenuation, no ischemia, EF 42%   Hx of colonic polyp 08/13/2010   adenomatous   Hx of colonoscopy    Hyperlipidemia    Hypertension    Hypertrophic cardiomyopathy (HCC)    dx by Dr Juanda Chance 2009   Iron deficiency anemia    Myocardial infarction [facility])    Pacemaker    Panic attacks    Permanent atrial fibrillation (HCC)    Pneumonia    Renal failure    baseline creatinine 1.6   Right arm pain 01/08/2012   RLS (restless legs syndrome)    Dx 06/2007   Sleep apnea    no cpap    Stroke [facility])    PSH: Past Surgical History:  Procedure Laterality Date   AV FISTULA PLACEMENT Left 09/09/2022   Procedure: LEFT BRACHEOCEPHALIC FISTULA CREATION;  Surgeon: Chuck Hint, MD;  Location: [facility] OR;  Service: Vascular;  Laterality: Left;   AV FISTULA PLACEMENT Left 06/02/2023   Procedure: INSERTION OF LEFT UPPER ARM ARTERIOVENOUS (AV) GORE-TEX GRAFT;  Surgeon: Chuck Hint, MD;  Location: [facility] OR;  Service: Vascular;  Laterality: Left;  BIOPSY  09/04/2020   Procedure: BIOPSY;  Surgeon: Napoleon Form, MD;  Location: [facility] ENDOSCOPY;  Service: Endoscopy;;   CARDIAC CATHETERIZATION     CARDIAC CATHETERIZATION N/A 08/19/2016   Procedure: Right/Left Heart Cath and Coronary Angiography;  Surgeon: Dolores Patty, MD;  Location: [facility] INVASIVE CV LAB;  Service: Cardiovascular;  Laterality: N/A;   COLONOSCOPY WITH PROPOFOL N/A 06/16/2017   Procedure: COLONOSCOPY WITH PROPOFOL;  Surgeon: Napoleon Form, MD;  Location: [facility] ENDOSCOPY;  Service: Endoscopy;  Laterality: N/A;   COLONOSCOPY WITH PROPOFOL N/A 09/04/2020   Procedure: COLONOSCOPY WITH PROPOFOL;  Surgeon: Napoleon Form, MD;  Location: [facility] ENDOSCOPY;  Service: Endoscopy;  Laterality: N/A;   DIRECT LARYNGOSCOPY N/A 09/18/2016   Procedure: DIRECT LARYNGOSCOPY, BRONCHOSCOPY, REMOVAL OF INTUBATION GRANULOMA;  Surgeon: Flo Shanks, MD;  Location: [facility] OR;  Service:  ENT;  Laterality: N/A;   DIRECT LARYNGOSCOPY N/A 10/20/2016   Procedure: EXTUBATION AND FLEXIBLE LARYNGOSCOPE;  Surgeon: Flo Shanks, MD;  Location: [facility] OR;  Service: ENT;  Laterality: N/A;   DIRECT LARYNGOSCOPY N/A 10/29/2016   Procedure: DIRECT LARYNGOSCOPY;  Surgeon: Flo Shanks, MD;  Location: [facility] OR;  Service: ENT;  Laterality: N/A;   ENTEROSCOPY N/A 09/30/2021   Procedure: ENTEROSCOPY;  Surgeon: Benancio Deeds, MD;  Location: [facility] ENDOSCOPY;  Service: Gastroenterology;  Laterality: N/A;   ESOPHAGOGASTRODUODENOSCOPY  12/23/2011   Procedure: ESOPHAGOGASTRODUODENOSCOPY (EGD);  Surgeon: Hart Carwin, MD;  Location: [facility]COPY;  Service: Endoscopy;  Laterality: N/A;   ESOPHAGOGASTRODUODENOSCOPY (EGD) WITH PROPOFOL N/A 06/16/2017   Procedure: ESOPHAGOGASTRODUODENOSCOPY (EGD) WITH PROPOFOL;  Surgeon: Napoleon Form, MD;  Location: [facility];  Service: Endoscopy;  Laterality: N/A;   ESOPHAGOGASTRODUODENOSCOPY (EGD) WITH PROPOFOL N/A 09/04/2020   Procedure: ESOPHAGOGASTRODUODENOSCOPY (EGD) WITH PROPOFOL;  Surgeon: Napoleon Form, MD;  Location: [facility];  Service: Endoscopy;  Laterality: N/A;   EXTUBATION (ENDOTRACHEAL) IN OR N/A 07/21/2016   Procedure: EXTUBATION (ENDOTRACHEAL) IN OR;  Surgeon: Flo Shanks, MD;  Location: [facility] OR;  Service: ENT;  Laterality: N/A;   GIVENS CAPSULE STUDY  12/23/2011   Procedure: GIVENS CAPSULE STUDY;  Surgeon: Hart Carwin, MD;  Location: [facility]OSCOPY;  Service: Endoscopy;  Laterality: N/A;   HOT HEMOSTASIS N/A 09/04/2020   Procedure: HOT HEMOSTASIS (ARGON PLASMA COAGULATION/BICAP);  Surgeon: Napoleon Form, MD;  Location: [facility];  Service: Endoscopy;  Laterality: N/A;   HOT HEMOSTASIS N/A 09/30/2021   Procedure: HOT HEMOSTASIS (ARGON PLASMA COAGULATION/BICAP);  Surgeon: Benancio Deeds, MD;  Location: [facility]OSCOPY;  Service: Gastroenterology;  Laterality: N/A;   MICROLARYNGOSCOPY WITH LASER N/A 10/16/2016   Procedure:  MICRODIRECTLARYNGOSCOPY WITH LASER ABLATION AND KENLOG INJECTION;  Surgeon: Flo Shanks, MD;  Location: [facility] OR;  Service: ENT;  Laterality: N/A;   PACEMAKER INSERTION  1998   st jude, most recent gen change by [facility] 4/12   POLYPECTOMY  09/04/2020   Procedure: POLYPECTOMY;  Surgeon: Napoleon Form, MD;  Location: [facility]OS[facility];  Service: Endoscopy;;   SUBMUCOSAL TATTOO INJECTION  09/30/2021   Procedure: SUBMUCOSAL TATTOO INJECTION;  Surgeon: Benancio Deeds, MD;  Location: [facility];  Service: Gastroenterology;;   TRACHEOSTOMY TUBE PLACEMENT N/A 10/29/2016   Procedure: TRACHEOSTOMY;  Surgeon: Flo Shanks, MD;  Location: [facility] OR;  Service: ENT;  Laterality: N/A;   TUBAL LIGATION  04/01/2000    Past Medical History:  Diagnosis Date   Anemia    Angioedema    2/2 ACE   Arteriovenous malformation of stomach    Arthritis    Asthma  AVM (arteriovenous malformation) of colon    small intestine; stomach   Blood transfusion    CHF (congestive heart failure) (HCC)    CKD (chronic kidney disease) stage 4, GFR 15-29 ml/min (HCC) 09/24/2010   Baseline Cr 1.3-1.6   Complete heart block (HCC)    s/p PPM 1998   Complication of anesthesia    Difficult airway; anaphylaxis and swelling with propofol   COPD (chronic obstructive pulmonary disease) (HCC)    DDD (degenerative disc disease)    Dementia (HCC)    Depression    Diabetes mellitus, type 2 (HCC)    pt's daughter states pt has never had diabetes   Diastolic heart failure    Fatty liver 07/26/2010   GERD (gastroesophageal reflux disease)    GI bleed    History of alcohol abuse Stopped Fall 2012   History of tobacco use Quit Fall 2012   Hx of cardiovascular stress test    a. Lexiscan Myoview (10/15):  Small inferolateral and apical defect c/w scar and poss soft tissue attenuation, no ischemia, EF 42%   Hx of colonic polyp 08/13/2010   adenomatous   Hx of colonoscopy    Hyperlipidemia    Hypertension    Hypertrophic  cardiomyopathy (HCC)    dx by Dr Juanda Chance 2009   Iron deficiency anemia    Myocardial infarction [facility])    Pacemaker    Panic attacks    Permanent atrial fibrillation (HCC)    Pneumonia    Renal failure    baseline creatinine 1.6   Right arm pain 01/08/2012   RLS (restless legs syndrome)    Dx 06/2007   Sleep apnea    no cpap    Stroke (HCC)     Medications:  Reviewed in Epic and include: Apixaband Aricept ESA K supps Metoprolol Torsemide    ALLERGIES:   Allergies  Allergen Reactions   Ace Inhibitors Swelling    angiodema   Other Anaphylaxis and Swelling    Reaction to unspecified anesthesia    Propofol     Dysrhythmia likely to drop in afterload post induction with Propofol. Did not have anaphylactic symptoms with MAC in 2018    FAM HX: Family History  Problem Relation Age of Onset   Hypertension Mother    Stroke Mother    Heart disease Mother    Aneurysm Mother    CVA Mother    Hypertension Father    Stroke Father    Heart attack Father    Alcohol abuse Father    Hypertension Sister    Hypertension Brother    Colon cancer Brother    Cancer Brother    Diabetes Sister    Diabetes Brother    Heart attack Brother    Heart attack Brother    Heart disease Sister    Alcohol abuse Brother    Heart disease Daughter    Esophageal cancer Neg Hx    Stomach cancer Neg Hx    Rectal cancer Neg Hx     Social History:   reports that she quit smoking about 11 years ago. Her smoking use included cigarettes. She started smoking about 61 years ago. She has a 5 pack-year smoking history. She has never used smokeless tobacco. She reports that she does not drink alcohol and does not use drugs.  ROS: ROS: complete ROS not able to be obtained d/t pt's intermittent agitation- however she appears uncomfortable  Blood pressure (!) 115/58, pulse 71, temperature 97.9 F (36.6 C), temperature  source Oral, resp. rate 16, SpO2 98%. PHYSICAL EXAM: Physical Exam GEN appears  intermittently agitated HEENT + trach in place with speaking valve NECK no JVD PULM wheezing bilaterally CV RRR ABD soft EXT no LE edema MSK: sarcopenic NEURO + twitching    Results for orders placed or performed during the hospital encounter of 07/07/23 (from the past 48 hour(s))  Comprehensive metabolic panel     Status: Abnormal   Collection Time: 07/07/23  3:45 PM  Result Value Ref Range   Sodium 135 135 - 145 mmol/L   Potassium 7.1 (HH) 3.5 - 5.1 mmol/L    Comment: CRITICAL RESULT CALLED TO, READ BACK BY AND VERIFIED WITH J. NEWTON, RN AT 1641 07.23.24 D. BLU   Chloride 105 98 - 111 mmol/L   CO2 10 (L) 22 - 32 mmol/L   Glucose, Bld 150 (H) 70 - 99 mg/dL    Comment: Glucose reference range applies only to samples taken after fasting for at least 8 hours.   BUN 119 (H) 8 - 23 mg/dL   Creatinine, Ser 7.62 (H) 0.44 - 1.00 mg/dL   Calcium 8.7 (L) 8.9 - 10.3 mg/dL   Total Protein 6.2 (L) 6.5 - 8.1 g/dL   Albumin 2.9 (L) 3.5 - 5.0 g/dL   AST 831 (H) 15 - 41 U/L   ALT 39 0 - 44 U/L   Alkaline Phosphatase 48 38 - 126 U/L   Total Bilirubin 0.5 0.3 - 1.2 mg/dL   GFR, Estimated 5 (L) >60 mL/min    Comment: (NOTE) Calculated using the CKD-EPI Creatinine Equation (2021)    Anion gap 20 (H) 5 - 15    Comment: Performed at [facility] Lab, [address] [place], [place] [address]  Lipase, blood     Status: Abnormal   Collection Time: 07/07/23  3:45 PM  Result Value Ref Range   Lipase 52 (H) 11 - 51 U/L    Comment: Performed at [facility], [address] [place], [place] [address]  CBC with Differential     Status: Abnormal   Collection Time: 07/07/23  3:45 PM  Result Value Ref Range   WBC 13.8 (H) 4.0 - 10.5 K/uL   RBC 2.22 (L) 3.87 - 5.11 MIL/uL   Hemoglobin 5.8 (LL) 12.0 - 15.0 g/dL    Comment: REPEATED TO VERIFY THIS CRITICAL RESULT HAS VERIFIED AND BEEN CALLED TO J. NEWTON, RN BY SHAY EKDAHL ON 07 23 2024 AT 1647, AND HAS BEEN READ BACK.     HCT 22.9 (L) 36.0  - 46.0 %   MCV 103.2 (H) 80.0 - 100.0 fL   MCH 26.1 26.0 - 34.0 pg   MCHC 25.3 (L) 30.0 - 36.0 g/dL   RDW 10.6 (H) 26.9 - 48.5 %   Platelets 304 150 - 400 K/uL   nRBC 0.7 (H) 0.0 - 0.2 %   Neutrophils Relative % 94 %   Neutro Abs 13.0 (H) 1.7 - 7.7 K/uL   Lymphocytes Relative 1 %   Lymphs Abs 0.2 (L) 0.7 - 4.0 K/uL   Monocytes Relative 4 %   Monocytes Absolute 0.5 0.1 - 1.0 K/uL   Eosinophils Relative 0 %   Eosinophils Absolute 0.0 0.0 - 0.5 K/uL   Basophils Relative 0 %   Basophils Absolute 0.0 0.0 - 0.1 K/uL   Immature Granulocytes 1 %   Abs Immature Granulocytes 0.09 (H) 0.00 - 0.07 K/uL    Comment: Performed at [facility],  [address] [place], [place] [address]  POC occult blood, ED     Status: Abnormal   Collection Time: 07/07/23  5:32 PM  Result Value Ref Range   Fecal Occult Bld POSITIVE (A) NEGATIVE   *Note: Due to a large number of results and/or encounters for the requested time period, some results have not been displayed. A complete set of results can be found in Results Review.    DG Chest Port 1 View  Result Date: 07/07/2023 CLINICAL DATA:  Failure to thrive EXAM: PORTABLE CHEST 1 VIEW COMPARISON:  Previous studies including the examination of 04/23/2020 FINDINGS: Transverse diameter of heart is increased. There are no signs of alveolar pulmonary edema or focal pulmonary consolidation. Tip of tracheostomy is 7.7 cm above the carina. Pacemaker battery is seen in right infraclavicular region. There is no pleural effusion or pneumothorax. IMPRESSION: Cardiomegaly. There are no signs of pulmonary edema or focal pulmonary consolidation. Electronically Signed   By: Ernie Avena M.D.   On: 07/07/2023 15:27    Assessment/Plan  67F with a multiple medical comorbidities p/w 2 week history of FTT, uremia, and GI bleed in the setting of advanced CKD.   AKI on CKD --> ESRD.  Have discussed situation with 2 dtrs.  They elect for full scope care.  Dialysis has  been prepared for as OP.  - will start dialysis tonight- 2K bath, 2 hrs, 0 UF 16 g needles  - lokelma TID  - will need CLIP as OP- this will be very difficult given permanent trach and debility  - will use AVG  - I agree with re-engaging palliative care  2.  Hyperkalemia:  - home med list shows K supps   - albuterol, Ca, Lasix, lokelma, bicarb gtt  - Lokelma TID  - dialysis will ultimately correct this  3.  Anemia:  - suspect GI bleed on top of anemia of renal failure  - getting 2 u pRBCS  - adding on iron studies  4.  BMM  - will need PTH- ordered  5.  Dementia:  - on Aricept  - agree with pall care c/s  6.  Dispo:   - being admitted  Bufford Buttner 07/07/2023, 6:06 PM

## 2023-07-07 NOTE — H&P (Signed)
Date: 07/07/2023                         Patient Name:  [name] MRN: [id number]  DOB: 09/11/1946 Age / Sex: 77 y.o., female   PCP: [facility]                        Medical Service: Internal Medicine Teaching Service                        Attending Physician: Dr. Dickie La, MD      First Contact: Georgann Housekeeper, MS4 Pager: [phone]  Second Contact: Dr. Elza Rafter Pager: [phone]                    After Hours (After 5p/   Pager: [phone]  weekends / holidays):  Pager: [phone]    Chief Complaint: melena, poor PO intake    History of Present Illness:   History obtained by adult daughter   [name] 77 y.o. female with PMHx of complete heart block s/p Abbott PPM in 1986, atrial fibrillation, HTN, aortic atherosclerosis, laryngeal stenosis, CVA 04/2019, AVMs/GI bleed in 2012, HCM/chronic congestive heart failure, ESRD s/p fistula (not on HD yet), and COPD who presents with decreased PO intake x 2-3 weeks and melena reported over the last 3 days.    Over the last few weeks she has reportedly deviated from her baseline functional status. At her baseline, she was able to walk at home with a cane, dress herself, and shower but she has since been unable to. She lives at home with her daughter in [place], [place] and follows with [facility]. Today (7/23) she was to see nephrology to determine the start date of her dialysis, but reportedly she felt ill and was recommended to come to the ED instead.     Over the last few days she has been reported to have melena however she has had a history of GI bleed and has developed melena in the past. According to the family, there was no identifiable source of her prior GI bleed.    No reports of recent sick contacts, chest pain, SOB, abdominal pain, pain, weakness. She is alert and oriented x2 at baseline, now x1 (self).    She has had a tracheostomy for 5 years after it was reported she developed  respiratory failure following propofol administration during Watchman procedure and has since developed laryngeal stenosis.   ED: K 7.1, Hb 5.8. WBC 13.8. Cr 7.81. Scheduled to receive 2u pRBCs. Fecal occult blood positive. CXR showing cardiomegaly. EKG: Ventricular-paced rhythm with prolonged QT interval. GI and nephro consulted    [name] currently lacks decisional capacity for healthcare decision-making and is unable to designate a surrogate Environmental health practitioner. [name] designated healthcare decision maker(s) is/are Elan Mcelvain (the patient's adult sibling).   Meds: Confirmed to take following meds:   Docusate-senna 50mg /8.6mg  2 tabs daily  Donepezil 10mg  daily  Eliquis 2.5mg  BID  Ferosul 325mg  daily  Metop succinate 12.5mg  daily  Mirtazapine 7.5mg  daily  Potassium chloride daily  Prednisone 5mg  daily Rosuvastatin 40mg  daily  Torsemide 20mg  2 tabs daily             Current Facility-Administered Medications  Medication Dose Route Frequency Provider Last Rate Last Admin   0.9 %  sodium chloride infusion (Manually program via Guardrails IV Fluids)  Intravenous Once Rondel Baton, MD       acetaminophen (TYLENOL) tablet 650 mg  650 mg Oral Q6H PRN Aki Burdin N, DO        Or   acetaminophen (TYLENOL) suppository 650 mg  650 mg Rectal Q6H PRN Niley Helbig N, DO       alteplase (CATHFLO ACTIVASE) injection 2 mg  2 mg Intracatheter Once PRN Bufford Buttner, MD       anticoagulant sodium citrate solution 5 mL  5 mL Intracatheter PRN Bufford Buttner, MD       calcium gluconate 1 g/ 50 mL sodium chloride IVPB  1 g Intravenous Once Rondel Baton, MD       [START ON 07/08/2023] Chlorhexidine Gluconate Cloth 2 % PADS 6 each  6 each Topical Q0600 Bufford Buttner, MD       insulin aspart (novoLOG) injection 5 Units  5 Units Intravenous Once Rondel Baton, MD        And   dextrose 50 % solution 50 mL  1 ampule Intravenous Once Rondel Baton,  MD       donepezil (ARICEPT) tablet 10 mg  10 mg Oral QHS Santrice Muzio N, DO       furosemide (LASIX) injection 40 mg  40 mg Intravenous Once Rondel Baton, MD       heparin injection 1,000 Units  1,000 Units Intracatheter PRN Bufford Buttner, MD       lidocaine (PF) (XYLOCAINE) 1 % injection 5 mL  5 mL Intradermal PRN Bufford Buttner, MD       lidocaine-prilocaine (EMLA) cream 1 Application  1 Application Topical PRN Bufford Buttner, MD       metoprolol succinate (TOPROL-XL) 24 hr tablet 12.5 mg  12.5 mg Oral Daily Margerie Fraiser N, DO       mirtazapine (REMERON) tablet 7.5 mg  7.5 mg Oral QHS Elyce Zollinger N, DO       pentafluoroprop-tetrafluoroeth (GEBAUERS) aerosol 1 Application  1 Application Topical PRN Bufford Buttner, MD       rosuvastatin (CRESTOR) tablet 40 mg  40 mg Oral QHS Tevis Conger N, DO       senna-docusate (Senokot-S) tablet 1 tablet  1 tablet Oral QHS PRN Briscoe Daniello N, DO       sodium bicarbonate 150 mEq in dextrose 5 % 1,150 mL infusion   Intravenous Once Rondel Baton, MD       sodium chloride 0.9 % bolus 500 mL  500 mL Intravenous Once Rondel Baton, MD       sodium zirconium cyclosilicate (LOKELMA) packet 10 g  10 g Oral STAT Rondel Baton, MD       sodium zirconium cyclosilicate (LOKELMA) packet 10 g  10 g Oral TID Bufford Buttner, MD                Current Outpatient Medications  Medication Sig Dispense Refill   acetaminophen (TYLENOL) 650 MG CR tablet Take 650 mg by mouth every 8 (eight) hours as needed for pain.        albuterol (PROVENTIL HFA;VENTOLIN HFA) 108 (90 Base) MCG/ACT inhaler Inhale 1-2 puffs into the lungs every 6 (six) hours as needed for wheezing or shortness of breath. 1 Inhaler 6   apixaban (ELIQUIS) 2.5 MG TABS tablet Take 1 tablet (2.5 mg total) by mouth 2 (two) times daily. 60 tablet 1   bimatoprost (LUMIGAN) 0.01 % SOLN Place 1 drop into both eyes at bedtime.  brimonidine (ALPHAGAN) 0.2 % ophthalmic solution  Place 1 drop into the right eye in the morning and at bedtime.       donepezil (ARICEPT) 10 MG tablet Take 10 mg by mouth at bedtime.       Epoetin Alfa-epbx (RETACRIT IJ) Inject 1 Dose as directed once a week.        ferrous sulfate 325 (65 FE) MG tablet TAKE 1 TABLET BY MOUTH TWICE DAILY WITH MEALS TO KEEP BLOOD COUNT UP (Patient taking differently: Take 325 mg by mouth daily.) 60 tablet 11   guaiFENesin (MUCINEX) 600 MG 12 hr tablet Take 600 mg by mouth every 12 (twelve) hours as needed (congestion.).       ipratropium-albuterol (DUONEB) 0.5-2.5 (3) MG/3ML SOLN USE 3 ML VIA NEBULIZER EVERY 6 HOURS (Patient taking differently: Take 3 mLs by nebulization every 6 (six) hours as needed (shortness of breath/wheezing.).) 360 mL 0   Menthol, Topical Analgesic, (BIOFREEZE EX) Apply 1 Application topically 2 (two) times daily as needed (aches/pain.).       metoprolol succinate (TOPROL XL) 25 MG 24 hr tablet Take 0.5 tablets (12.5 mg total) by mouth daily. 45 tablet 1   mirtazapine (REMERON) 7.5 MG tablet Take 7.5 mg by mouth at bedtime.       nitroGLYCERIN (NITROSTAT) 0.4 MG SL tablet Place 1 tablet (0.4 mg total) under the tongue every 5 (five) minutes as needed for chest pain. 25 tablet 1   Nutritional Supplements (ENSURE PLUS PO) Take 237 mLs by mouth in the morning, at noon, and at bedtime.       potassium chloride SA (KLOR-CON M) 20 MEQ tablet Take 20 mEq by mouth daily.       predniSONE (DELTASONE) 5 MG tablet Take 5 mg by mouth daily.       rosuvastatin (CRESTOR) 40 MG tablet Take 1 tablet (40 mg total) by mouth at bedtime. 90 tablet 3   STIMULANT LAXATIVE 8.6-50 MG tablet Take 2 tablets by mouth at bedtime.       torsemide (DEMADEX) 20 MG tablet Take 2 tablets (40 mg total) by mouth daily. 90 tablet 3   traMADol (ULTRAM) 50 MG tablet Take 1 tablet (50 mg total) by mouth every 6 (six) hours as needed for moderate pain. 20 tablet 0      Allergies:      Allergies as of 07/07/2023 - Review  Complete 07/07/2023  Allergen Reaction Noted   Ace inhibitors Swelling 03/15/2008   Other Anaphylaxis and Swelling 10/15/2016   Propofol   07/15/2016        Past Medical History:  Diagnosis Date   Anemia     Angioedema      2/2 ACE   Arteriovenous malformation of stomach     Arthritis     Asthma     AVM (arteriovenous malformation) of colon      small intestine; stomach   Blood transfusion     CHF (congestive heart failure) (HCC)     CKD (chronic kidney disease) stage 4, GFR 15-29 ml/min (HCC) 09/24/2010    Baseline Cr 1.3-1.6   Complete heart block (HCC)      s/p PPM 1998   Complication of anesthesia      Difficult airway; anaphylaxis and swelling with propofol   COPD (chronic obstructive pulmonary disease) (HCC)     DDD (degenerative disc disease)     Dementia (HCC)     Depression     Diabetes mellitus, type 2 (HCC)  pt's daughter states pt has never had diabetes   Diastolic heart failure     Fatty liver 07/26/2010   GERD (gastroesophageal reflux disease)     GI bleed     History of alcohol abuse Stopped Fall 2012   History of tobacco use Quit Fall 2012   Hx of cardiovascular stress test      a. Lexiscan Myoview (10/15):  Small inferolateral and apical defect c/w scar and poss soft tissue attenuation, no ischemia, EF 42%   Hx of colonic polyp 08/13/2010    adenomatous   Hx of colonoscopy     Hyperlipidemia     Hypertension     Hypertrophic cardiomyopathy (HCC)      dx by Dr Juanda Chance 2009   Iron deficiency anemia     Myocardial infarction [facility])     Pacemaker     Panic attacks     Permanent atrial fibrillation (HCC)     Pneumonia     Renal failure      baseline creatinine 1.6   Right arm pain 01/08/2012   RLS (restless legs syndrome)      Dx 06/2007   Sleep apnea      no cpap    Stroke [facility])               Past Surgical History:  Procedure Laterality Date   AV FISTULA PLACEMENT Left 09/09/2022    Procedure: LEFT BRACHEOCEPHALIC FISTULA CREATION;   Surgeon: Chuck Hint, MD;  Location: [facility] OR;  Service: Vascular;  Laterality: Left;   AV FISTULA PLACEMENT Left 06/02/2023    Procedure: INSERTION OF LEFT UPPER ARM ARTERIOVENOUS (AV) GORE-TEX GRAFT;  Surgeon: Chuck Hint, MD;  Location[facility] OR;  Service: Vascular;  Laterality: Left;   BIOPSY   09/04/2020    Procedure: BIOPSY;  Surgeon: Napoleon Form, MD;  Location: [facility] ENDOSCOPY;  Service: Endoscopy;;   CARDIAC CATHETERIZATION       CARDIAC CATHETERIZATION N/A 08/19/2016    Procedure: Right/Left Heart Cath and Coronary Angiography;  Surgeon: Dolores Patty, MD;  Location: [facility] INVASIVE CV LAB;  Service: Cardiovascular;  Laterality: N/A;   COLONOSCOPY WITH PROPOFOL N/A 06/16/2017    Procedure: COLONOSCOPY WITH PROPOFOL;  Surgeon: Napoleon Form, MD;  Location: [facility] ENDOSCOPY;  Service: Endoscopy;  Laterality: N/A;   COLONOSCOPY WITH PROPOFOL N/A 09/04/2020    Procedure: COLONOSCOPY WITH PROPOFOL;  Surgeon: Napoleon Form, MD;  Location: [facility]OSCOPY;  Service: Endoscopy;  Laterality: N/A;   DIRECT LARYNGOSCOPY N/A 09/18/2016    Procedure: DIRECT LARYNGOSCOPY, BRONCHOSCOPY, REMOVAL OF INTUBATION GRANULOMA;  Surgeon: Flo Shanks, MD;  Location: [facility] OR;  Service: ENT;  Laterality: N/A;   DIRECT LARYNGOSCOPY N/A 10/20/2016    Procedure: EXTUBATION AND FLEXIBLE LARYNGOSCOPE;  Surgeon: Flo Shanks, MD;  Location: [facility] OR;  Service: ENT;  Laterality: N/A;   DIRECT LARYNGOSCOPY N/A 10/29/2016    Procedure: DIRECT LARYNGOSCOPY;  Surgeon: Flo Shanks, MD;  Location: [facility] OR;  Service: ENT;  Laterality: N/A;   ENTEROSCOPY N/A 09/30/2021    Procedure: ENTEROSCOPY;  Surgeon: Benancio Deeds, MD;  Location: [facility];  Service: Gastroenterology;  Laterality: N/A;   ESOPHAGOGASTRODUODENOSCOPY   12/23/2011    Procedure: ESOPHAGOGASTRODUODENOSCOPY (EGD);  Surgeon: Hart Carwin, MD;  Location: [facility];  Service: Endoscopy;  Laterality: N/A;   ESOPHAGOGASTRODUODENOSCOPY  (EGD) WITH PROPOFOL N/A 06/16/2017    Procedure: ESOPHAGOGASTRODUODENOSCOPY (EGD) WITH PROPOFOL;  Surgeon: Napoleon Form, MD;  Location: [facility];  Service: Endoscopy;  Laterality: N/A;   ESOPHAGOGASTRODUODENOSCOPY (EGD) WITH PROPOFOL N/A 09/04/2020    Procedure: ESOPHAGOGASTRODUODENOSCOPY (EGD) WITH PROPOFOL;  Surgeon: Napoleon Form, MD;  Location: [facility];  Service: Endoscopy;  Laterality: N/A;   EXTUBATION (ENDOTRACHEAL) IN OR N/A 07/21/2016    Procedure: EXTUBATION (ENDOTRACHEAL) IN OR;  Surgeon: Flo Shanks, MD;  Location: [facility] OR;  Service: ENT;  Laterality: N/A;   GIVENS CAPSULE STUDY   12/23/2011    Procedure: GIVENS CAPSULE STUDY;  Surgeon: Hart Carwin, MD;  Location: [facility];  Service: Endoscopy;  Laterality: N/A;   HOT HEMOSTASIS N/A 09/04/2020    Procedure: HOT HEMOSTASIS (ARGON PLASMA COAGULATION/BICAP);  Surgeon: Napoleon Form, MD;  Location: [facility];  Service: Endoscopy;  Laterality: N/A;   HOT HEMOSTASIS N/A 09/30/2021    Procedure: HOT HEMOSTASIS (ARGON PLASMA COAGULATION/BICAP);  Surgeon: Benancio Deeds, MD;  Location: [facility]OSCOPY;  Service: Gastroenterology;  Laterality: N/A;   MICROLARYNGOSCOPY WITH LASER N/A 10/16/2016    Procedure: MICRODIRECTLARYNGOSCOPY WITH LASER ABLATION AND KENLOG INJECTION;  Surgeon: Flo Shanks, MD;  Location: [facility] OR;  Service: ENT;  Laterality: N/A;   PACEMAKER INSERTION   1998    [facility], most recent gen change by [facility] 4/12   POLYPECTOMY   09/04/2020    Procedure: POLYPECTOMY;  Surgeon: Napoleon Form, MD;  Location: [facility];  Service: Endoscopy;;   SUBMUCOSAL TATTOO INJECTION   09/30/2021    Procedure: SUBMUCOSAL TATTOO INJECTION;  Surgeon: Benancio Deeds, MD;  Location: [facility];  Service: Gastroenterology;;   TRACHEOSTOMY TUBE PLACEMENT N/A 10/29/2016    Procedure: TRACHEOSTOMY;  Surgeon: Flo Shanks, MD;  Location: [facility] OR;  Service: ENT;  Laterality: N/A;   TUBAL LIGATION   04/01/2000              Family History  Problem Relation Age of Onset   Hypertension Mother     Stroke Mother     Heart disease Mother     Aneurysm Mother     CVA Mother     Hypertension Father     Stroke Father     Heart attack Father     Alcohol abuse Father     Hypertension Sister     Hypertension Brother     Colon cancer Brother     Cancer Brother     Diabetes Sister     Diabetes Brother     Heart attack Brother     Heart attack Brother     Heart disease Sister     Alcohol abuse Brother     Heart disease Daughter     Esophageal cancer Neg Hx     Stomach cancer Neg Hx     Rectal cancer Neg Hx          Social History         Socioeconomic History   Marital status: Divorced      Spouse name: Not on file   Number of children: 4   Years of education: 9   Highest education level: Not on file  Occupational History   Occupation: disabled      Associate Professor: UNEMPLOYED   Occupation: previously- Human resources officer  Tobacco Use   Smoking status: Former      Current packs/day: 0.00      Average packs/day: 0.1 packs/day for 50.0 years (5.0 ttl pk-yrs)      Types: Cigarettes      Start date: 08/15/1961      Quit date: 08/16/2011  Years since quitting: 11.8   Smokeless tobacco: Never   Tobacco comments:      quit 2012  Vaping Use   Vaping status: Never Used  Substance and Sexual Activity   Alcohol use: No      Comment: quit 08/16/2011   Drug use: No   Sexual activity: Not Currently  Other Topics Concern   Not on file  Social History Narrative    On disability for heart failure/pacemaker.      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     4 grown children,  8 grandchildren.      Drinks alcohol a few times a week socially. At one time, the most she reports drinking is about 3 mixed drinks.    Lives with daughter Serafina Mitchell and son. Will be moving 04/2011 to different house because concerned children are taking advantage of her financial situation.     No illicit drugs.     Smokes few cigarettes daily, especially when stressed. Started smoking @ age 52. Quit January 2012 2/2 health but started smoking again 02/2011.          Health Care POA:     Emergency Contact: daughter, Suzette Battiest [phone]    End of Life Plan: gave pt AD info pamphlet    Who lives with you: no one- lives in section 8 housing    Any pets: none    Diet: Pt has a varied diet of protein, starch and vegetables    Exercise: Pt has no regular exercise routine.    Seatbelts: Pt reports wearing seatbelt when in vehicles.     Hobbies: dancing    Social Determinants of Health    Financial Resource Strain: Not on file  Food Insecurity: Not on file  Transportation Needs: Not on file  Physical Activity: Not on file  Stress: Not on file  Social Connections: Not on file  Intimate Partner Violence: Not on file       Social Hx: Lives with daughter in [place]. Prior smoker but quit 8-10 years ago. Also quit drinking alcohol years ago. Independent of ADLs at baseline, but has not been able to complete them recently. PCP is through [facility].    Review of Systems: Pertinent items noted in HPI and remainder of comprehensive ROS otherwise negative.   Physical Exam: Blood pressure 139/63, pulse 69, temperature 97.9 F (36.6 C), temperature source Oral, resp. rate 16, SpO2 100%. BP 139/63   Pulse 69   Temp 97.9 F (36.6 C) (Oral)   Resp 16   SpO2 100%   General appearance:  frail and ill appearing elderly female, awake and alert, in no acute distress Eyes: conjunctivae/corneas clear. PERRL, EOM's intact. Fundi benign. Neck:  tracheostomy present Lungs: clear to auscultation bilaterally Heart: regular rate and rhythm, S1, S2 normal, no murmur, click, rub or gallop Abdomen: soft, non-tender; bowel sounds normal; no masses,  no organomegaly Extremities: sarcopenic, no LE edema. LUE AV graft Neurologic: oriented to self, pleasantly confused.   Lab results:    Latest Ref Rng & Units 07/07/2023     3:45 PM 06/02/2023    8:34 AM 09/09/2022    8:09 AM  CBC  WBC 4.0 - 10.5 K/uL 13.8     Hemoglobin 12.0 - 15.0 g/dL 5.8  45.4  09.8   Hematocrit 36.0 - 46.0 % 22.9  31.0  35.0   Platelets 150 - 400 K/uL 304         Latest Ref Rng & Units  07/07/2023    3:45 PM 06/02/2023    8:34 AM 05/29/2023   10:58 AM  CMP  Glucose 70 - 99 mg/dL 161  096    BUN 8 - 23 mg/dL 045  51    Creatinine 4.09 - 1.00 mg/dL 8.11  9.14    Sodium 782 - 145 mmol/L 135  144    Potassium 3.5 - 5.1 mmol/L 7.1  5.0    Chloride 98 - 111 mmol/L 105  117    CO2 22 - 32 mmol/L 10     Calcium 8.9 - 10.3 mg/dL 8.7     Total Protein 6.5 - 8.1 g/dL 6.2   6.8   Total Bilirubin 0.3 - 1.2 mg/dL 0.5   0.3   Alkaline Phos 38 - 126 U/L 48   62   AST 15 - 41 U/L 167   29   ALT 0 - 44 U/L 39   15    No results for input(s): "GLUCAP" in the last 72 hours.  Imaging results:  DG Chest Port 1 View  Result Date: 07/07/2023 CLINICAL DATA:  Failure to thrive EXAM: PORTABLE CHEST 1 VIEW COMPARISON:  Previous studies including the examination of 04/23/2020 FINDINGS: Transverse diameter of heart is increased. There are no signs of alveolar pulmonary edema or focal pulmonary consolidation. Tip of tracheostomy is 7.7 cm above the carina. Pacemaker battery is seen in right infraclavicular region. There is no pleural effusion or pneumothorax. IMPRESSION: Cardiomegaly. There are no signs of pulmonary edema or focal pulmonary consolidation. Electronically Signed   By: Ernie Avena M.D.   On: 07/07/2023 15:27     Other results: EKG: ventricular paced rhythm with prolonged QT interval  Assessment & Plan by Problem: Principal Problem:   Hyperkalemia  [name] 77 y.o. female with PMHx of complete heart block s/p Abbott PPM in 1986, atrial fibrillation, HTN, aortic atherosclerosis, tracheal stenosis, CVA 04/2019, GI bleed 2012, HCM/chronic congestive heart failure, ESRD s/p fistula, COPD who presents with decreased PO intake x  2-3 weeks and melena reported 3 days ago.    Active Problems   #Melena  H/o small bowel AVMs #Decreased PO intake #Acute on chronic anemia Hx of anemia in the setting of chronic kidney disease, now with acute blood loss anemia. Also has hx of GI bleed in 2012 and reported small bowel AVMs in 2022. She presents with melena for the last 3 days with physical deconditioning for the last 2-3 weeks. Appearing frail and weak on initial exam. Of note, she is on Eliquis 2.5mg  BID. Hb 5.8 on admission. Planning to receive 2u pRBCs. CXR showed cardiomegaly and no signs of pulmonary edema. She had an EGD and colonoscopy in 09/04/20 for AVMs and had a few duodenal AVMs ablated and 8 small adenomas removed from her colon. No cause for anemia was noted in the colon but was thought to be related to small bowel AVM. Additionally, she has previously required IV iron and blood transfusions for her chronic anemia. Unclear etiology of her melena but chronic anemia may be secondary to her history of CKD.   -GI consulted, appreciate recs, plan for EGD to assess upper GI bleed  -Start IV protonix 80mg  infusion -CBC daily   -If Hb < 7, transfuse with 1u pRBCs -Follow up iron studies   #ESRD s/p AV graft fistula #Hyperkalemia  #Elevated BUN  Left UE AV graft placement on 06/01/23. Has not received dialysis yet. Unclear her baseline Cr but likely ~2-3 .  Unclear etiology for her renal failure but suspected to be secondary to HTN. No reported history of DM. Patient was to start HD soon, but had not yet. Possible patient's baseline mental status worsened by her chronic renal failure/uremia.  Additionally, K elevated to 7.1 in the ED. She received calcium gluconate, insulin/D50, albuterol, lasix, and lokelma.  -Nephro following, plans to proceed with dialysis tonight -Daily BMP    #Dementia  Reported onset of dementia in 2021. Previously able to perform ADLs however over the last few weeks patient has deviated from her  baseline and requires the support of her family members to care for her. She currently is oriented to person but not to place and appearing confused on exam. BUN significantly elevated on initial exam likely worsening her mental status.  -Continue home donepezil 10mg  daily   -Planned dialysis with nephrology  -Consult palliative care for goals of care    Chronic Problems   #Atrial fibrillation #HLD #H/o stroke Reported stroke on 04/2019 with residual expressive aphasia. Takes home rosuvastatin 40mg  daily and home Eliquis 2.5mg  BID. Afib has since been rate controlled with Metoprolol succinate. Last lipid panel showed LDL 55 (05/29/23) -Cont home rosuvastatin  -Continue metoprolol succinate 12.5mg  daily   -Hold Eliquis iso melena   #Chronic CHF  HCM  #Complete heart block s/p PPM (1986)  Follows with HF clinic. Home GDMT incldues Metoprolol succinate 12.5mg  daily, Torsemide 40mg  every day. Last ECHO 09/2021 LVEF 40-45%   -Continue home GDMT    #HTN: Well-controlled at home. Takes home antihypertensives as stated above    This is a Psychologist, occupational Note.  The care of the patient was discussed with Dr. Ned Card and the assessment and plan was formulated with their assistance.  Please see their note for official documentation of the patient encounter.    Signed: Lona Millard, MS4 07/07/2023, 7:28 PM    Attestation for Student Documentation:  I personally was present and performed or re-performed the history, physical exam and medical decision-making activities of this service and have verified that the service and findings are accurately documented in the student's note.  Chauncey Mann, DO 07/07/2023, 8:48 PM

## 2023-07-07 NOTE — Progress Notes (Signed)
RN obtained verbal consent from daughter for blood transfusion along with Elnita Maxwell RN over the phone.   [name] [name]

## 2023-07-07 NOTE — Progress Notes (Signed)
Received patient on stretcher to unit.  Alert and oriented X 1 (person).  Informed consent signed and in chart.   TX duration: 2 hours  Patient tolerated well.  Transported back to the room  Alert, without acute distress.  Hand-off given to patient's nurse.   Access used: LUE AVG Access issues: None  Total UF removed: 0 mL Medication(s) given: 2 Units PRBC Post HD VS: 127/58 (80) - 68 - 15 - 100% Post HD weight: Unable to obtain     07/07/23 2247  Vitals  BP (!) 127/58  MAP (mmHg) 80  BP Location Right Arm  BP Method Automatic  Patient Position (if appropriate) Lying  Pulse Rate 68  Pulse Rate Source Monitor  ECG Heart Rate 65  Resp 15  Oxygen Therapy  SpO2 100 %  O2 Device Tracheostomy Collar  During Treatment Monitoring  Intra-Hemodialysis Comments See progress note (post rinse back)  Post Treatment  Dialyzer Clearance Lightly streaked  Duration of HD Treatment -hour(s) 2 hour(s)  Hemodialysis Intake (mL) 0 mL  Liters Processed 30  Fluid Removed (mL) 0 mL  Tolerated HD Treatment Yes  Post-Hemodialysis Comments Gauze applied to AVG cannulation sites and secured with paper tape, hemostasis achieved.  AVG/AVF Arterial Site Held (minutes) 10 minutes  AVG/AVF Venous Site Held (minutes) 20 minutes  Note  Patient Observations Patient alert, oriented to person, VSS, no acute distress noted, condition stable for this d/c.     [facility]

## 2023-07-08 DIAGNOSIS — N186 End stage renal disease: Secondary | ICD-10-CM

## 2023-07-08 DIAGNOSIS — G9340 Encephalopathy, unspecified: Secondary | ICD-10-CM | POA: Diagnosis present

## 2023-07-08 DIAGNOSIS — R627 Adult failure to thrive: Secondary | ICD-10-CM | POA: Diagnosis not present

## 2023-07-08 DIAGNOSIS — I4891 Unspecified atrial fibrillation: Secondary | ICD-10-CM

## 2023-07-08 DIAGNOSIS — K921 Melena: Secondary | ICD-10-CM | POA: Diagnosis not present

## 2023-07-08 DIAGNOSIS — D649 Anemia, unspecified: Secondary | ICD-10-CM

## 2023-07-08 DIAGNOSIS — Z7901 Long term (current) use of anticoagulants: Secondary | ICD-10-CM | POA: Diagnosis not present

## 2023-07-08 DIAGNOSIS — Z992 Dependence on renal dialysis: Secondary | ICD-10-CM | POA: Diagnosis not present

## 2023-07-08 DIAGNOSIS — K922 Gastrointestinal hemorrhage, unspecified: Secondary | ICD-10-CM | POA: Diagnosis present

## 2023-07-08 DIAGNOSIS — Z7189 Other specified counseling: Secondary | ICD-10-CM

## 2023-07-08 LAB — CBC
HCT: 31.2 % — ABNORMAL LOW (ref 36.0–46.0)
Hemoglobin: 9.9 g/dL — ABNORMAL LOW (ref 12.0–15.0)
MCH: 28.4 pg (ref 26.0–34.0)
MCHC: 31.7 g/dL (ref 30.0–36.0)
MCV: 89.7 fL (ref 80.0–100.0)
Platelets: 197 10*3/uL (ref 150–400)
RBC: 3.48 MIL/uL — ABNORMAL LOW (ref 3.87–5.11)
RDW: 16.2 % — ABNORMAL HIGH (ref 11.5–15.5)
WBC: 13.2 10*3/uL — ABNORMAL HIGH (ref 4.0–10.5)
nRBC: 0.3 % — ABNORMAL HIGH (ref 0.0–0.2)

## 2023-07-08 LAB — TYPE AND SCREEN
Unit division: 0
Unit division: 0

## 2023-07-08 LAB — COMPREHENSIVE METABOLIC PANEL
ALT: 39 U/L (ref 0–44)
AST: 174 U/L — ABNORMAL HIGH (ref 15–41)
Albumin: 2.7 g/dL — ABNORMAL LOW (ref 3.5–5.0)
Alkaline Phosphatase: 48 U/L (ref 38–126)
Anion gap: 12 (ref 5–15)
BUN: 59 mg/dL — ABNORMAL HIGH (ref 8–23)
CO2: 22 mmol/L (ref 22–32)
Calcium: 8.2 mg/dL — ABNORMAL LOW (ref 8.9–10.3)
Chloride: 101 mmol/L (ref 98–111)
Creatinine, Ser: 4.94 mg/dL — ABNORMAL HIGH (ref 0.44–1.00)
GFR, Estimated: 8 mL/min — ABNORMAL LOW (ref >60)
Glucose, Bld: 100 mg/dL — ABNORMAL HIGH (ref 70–99)
Potassium: 4.1 mmol/L (ref 3.5–5.1)
Sodium: 135 mmol/L (ref 135–145)
Total Bilirubin: 0.5 mg/dL (ref 0.3–1.2)
Total Protein: 5.7 g/dL — ABNORMAL LOW (ref 6.5–8.1)

## 2023-07-08 LAB — PROTIME-INR
INR: 1.6 — ABNORMAL HIGH (ref 0.8–1.2)
Prothrombin Time: 18.9 seconds — ABNORMAL HIGH (ref 11.4–15.2)

## 2023-07-08 LAB — BPAM RBC
ISSUE DATE / TIME: 202407240143
Unit Type and Rh: 5100

## 2023-07-08 LAB — HEMOGLOBIN AND HEMATOCRIT, BLOOD
HCT: 33.4 % — ABNORMAL LOW (ref 36.0–46.0)
Hemoglobin: 10.6 g/dL — ABNORMAL LOW (ref 12.0–15.0)

## 2023-07-08 LAB — GLUCOSE, CAPILLARY: Glucose-Capillary: 80 mg/dL (ref 70–99)

## 2023-07-08 NOTE — Progress Notes (Signed)
Received patient in bed to unit.  Alert and oriented.  Informed consent signed and in chart.   TX duration: 2.5  Patient tolerated well.  Transported back to the room  Alert, without acute distress.  Hand-off given to patient's nurse.   Access used: Left AVG Access issues: none  Total UF removed: 0 Medication(s) given: none   07/08/23 1652  Vitals  Temp 97.9 F (36.6 C)  Temp Source Oral  BP (!) 106/55  MAP (mmHg) 71  BP Location Right Arm  BP Method Automatic  Patient Position (if appropriate) Lying  Pulse Rate 70  ECG Heart Rate 70  Resp 14  Oxygen Therapy  SpO2 100 %  O2 Device Tracheostomy Collar  O2 Flow Rate (L/min) 5 L/min  Patient Activity (if Appropriate) In bed  Pulse Oximetry Type Continuous  During Treatment Monitoring  HD Safety Checks Performed Yes  Intra-Hemodialysis Comments Tx completed;Tolerated well  Dialysis Fluid Bolus Normal Saline  Bolus Amount (mL) 300 mL    Stacie Glaze LPN [facility]

## 2023-07-08 NOTE — Consult Note (Signed)
Consultation Note Date: 07/08/2023   Patient Name: [name]  DOB: May 04, 1946  MRN: [id number]  Age / Sex: 77 y.o., female  PCP: [facility] Referring Physician: Dickie La, MD  Reason for Consultation: Establishing goals of care  HPI/Patient Profile: 77 y.o. female  admitted on 07/07/2023 with PMHx of complete heart block s/p Abbott PPM in 1986, atrial fibrillation, HTN, aortic atherosclerosis, laryngeal stenosis, CVA 04/2019, AVMs/GI bleed in 2012, HCM/chronic congestive heart failure, ESRD s/p fistula (not on HD yet), and COPD who presents with decreased PO iOver the last few days she has been reported to have melena however she has had a history of GI bleed and has developed melena in the past. According to the family, there was no identifiable source of her prior GI bleed. ntake x 2-3 weeks and melena reported over the last 3 days.   Over the last  week she has reportedly deviated from her baseline functional status. At her baseline, she was able to walk at home with a cane, dress herself, and shower but she has since been unable to. She lives at home with her daughter in [place], [place] and follows with [facility].   07-07-23  she was to see nephrology to determine the start date of her dialysis, but reportedly she felt ill and was recommended to come to the ED instead.     Family report that patient is normally able to complete all of her ADLs independently  She has had a tracheostomy for 5 years after it was reported she developed respiratory failure following propofol administration during Watchman procedure and has since developed laryngeal stenosis.  Patient and family face ongoing treatment option decisions,Advanced directive decisions and anticipatory care needs.  Clinical Assessment and Goals of Care:  This NP Lorinda Creed reviewed medical records, received report  from team, assessed the patient and then meet at the patient's bedside along with her daughter  to discuss diagnosis, prognosis, GOC, EOL wishes disposition and options.   Concept of Palliative Care was introduced as specialized medical care for people and their families living with serious illness.  If focuses on providing relief from the symptoms and stress of a serious illness.  The goal is to improve quality of life for both the patient and the family.  Values and goals of care important to patient and family were attempted to be elicited.  Education offered today regarding current medical situations specific her ESRD.  Nephrology expresses concern for her being a poor candidate for dialysis.  Her trach will affect viable OP dialysis opportunities      Will need to be explored.  GI spoke with daughter for consent for small bowel endoscopy     A  discussion was had today regarding advanced directives.  Concepts specific to code status, artifical feeding and hydration, continued IV antibiotics and rehospitalization was had.  The difference between a aggressive medical intervention path  and a palliative comfort care path for this patient at this time was had.  MOST form re-visited.  She has a completed MOST form and Vynca  Education offered on the importance of ongoing conversation regarding ACP especially as a person medical situation changes, in order to enhance patient centered care.      Questions and concerns addressed.  Family   encouraged to call with questions or concerns.     PMT will continue to support holistically.      I made several calls to [facility], await callback from providers.    Patient has documented H POA paperwork naming her daughter [name] as H POA   SUMMARY OF RECOMMENDATIONS    Code Status/Advance Care Planning: Full code Educated family to consider DNR/DNI status understanding evidenced based poor outcomes in similar hospitalized patient, as the  cause of arrest is likely associated with advanced chronic illness rather than an easily reversible acute cardio-pulmonary event.    Palliative Prophylaxis:  Aspiration, Delirium Protocol, Frequent Pain Assessment, and Oral Care  Additional Recommendations (Limitations, Scope, Preferences): Full Scope Treatment  Psycho-social/Spiritual:  Desire for further Chaplaincy support:no Additional Recommendations: Education on Hospice  Prognosis:  Unable to determine  Discharge Planning: To Be Determined      Primary Diagnoses: Present on Admission:  Hyperkalemia   I have reviewed the medical record, interviewed the patient and family, and examined the patient. The following aspects are pertinent.  Past Medical History:  Diagnosis Date   Anemia    Angioedema    2/2 ACE   Arteriovenous malformation of stomach    Arthritis    Asthma    AVM (arteriovenous malformation) of colon    small intestine; stomach   Blood transfusion    CHF (congestive heart failure) (HCC)    CKD (chronic kidney disease) stage 4, GFR 15-29 ml/min (HCC) 09/24/2010   Baseline Cr 1.3-1.6   Complete heart block (HCC)    s/p PPM 1998   Complication of anesthesia    Difficult airway; anaphylaxis and swelling with propofol   COPD (chronic obstructive pulmonary disease) (HCC)    DDD (degenerative disc disease)    Dementia (HCC)    Depression    Diabetes mellitus, type 2 (HCC)    pt's daughter states pt has never had diabetes   Diastolic heart failure    Fatty liver 07/26/2010   GERD (gastroesophageal reflux disease)    GI bleed    History of alcohol abuse Stopped Fall 2012   History of tobacco use Quit Fall 2012   Hx of cardiovascular stress test    a. Lexiscan Myoview (10/15):  Small inferolateral and apical defect c/w scar and poss soft tissue attenuation, no ischemia, EF 42%   Hx of colonic polyp 08/13/2010   adenomatous   Hx of colonoscopy    Hyperlipidemia    Hypertension    Hypertrophic  cardiomyopathy (HCC)    dx by Dr Juanda Chance 2009   Iron deficiency anemia    Myocardial infarction [facility])    Pacemaker    Panic attacks    Permanent atrial fibrillation (HCC)    Pneumonia    Renal failure    baseline creatinine 1.6   Right arm pain 01/08/2012   RLS (restless legs syndrome)    Dx 06/2007   Sleep apnea    no cpap    Stroke [facility])    Social History   Socioeconomic History   Marital status: Divorced    Spouse name: Not on file   Number of children: 4   Years of education: 9   Highest  education level: Not on file  Occupational History   Occupation: disabled    Employer: UNEMPLOYED   Occupation: previously- Human resources officer  Tobacco Use   Smoking status: Former    Current packs/day: 0.00    Average packs/day: 0.1 packs/day for 50.0 years (5.0 ttl pk-yrs)    Types: Cigarettes    Start date: 08/15/1961    Quit date: 08/16/2011    Years since quitting: 11.9   Smokeless tobacco: Never   Tobacco comments:    quit 2012  Vaping Use   Vaping status: Never Used  Substance and Sexual Activity   Alcohol use: No    Comment: quit 08/16/2011   Drug use: No   Sexual activity: Not Currently  Other Topics Concern   Not on file  Social History Narrative   On disability for heart failure/pacemaker.     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    4 grown children,  8 grandchildren.     Drinks alcohol a few times a week socially. At one time, the most she reports drinking is about 3 mixed drinks.   Lives with daughter Serafina Mitchell and son. Will be moving 04/2011 to different house because concerned children are taking advantage of her financial situation.    No illicit drugs.   Smokes few cigarettes daily, especially when stressed. Started smoking @ age 71. Quit January 2012 2/2 health but started smoking again 02/2011.       Health Care POA:    Emergency Contact: daughter, Suzette Battiest [phone]   End of Life Plan: gave pt AD info pamphlet   Who lives with you: no one-  lives in section 8 housing   Any pets: none   Diet: Pt has a varied diet of protein, starch and vegetables   Exercise: Pt has no regular exercise routine.   Seatbelts: Pt reports wearing seatbelt when in vehicles.    Hobbies: dancing   Social Determinants of Corporate investment banker Strain: Not on file  Food Insecurity: Not on file  Transportation Needs: Not on file  Physical Activity: Not on file  Stress: Not on file  Social Connections: Not on file   Family History  Problem Relation Age of Onset   Hypertension Mother    Stroke Mother    Heart disease Mother    Aneurysm Mother    CVA Mother    Hypertension Father    Stroke Father    Heart attack Father    Alcohol abuse Father    Hypertension Sister    Hypertension Brother    Colon cancer Brother    Cancer Brother    Diabetes Sister    Diabetes Brother    Heart attack Brother    Heart attack Brother    Heart disease Sister    Alcohol abuse Brother    Heart disease Daughter    Esophageal cancer Neg Hx    Stomach cancer Neg Hx    Rectal cancer Neg Hx    Scheduled Meds:  sodium chloride   Intravenous Once   sodium chloride   Intravenous Once   Chlorhexidine Gluconate Cloth  6 each Topical Q0600   donepezil  10 mg Oral QHS   metoprolol succinate  12.5 mg Oral Daily   mirtazapine  7.5 mg Oral QHS   rosuvastatin  40 mg Oral QHS   sodium zirconium cyclosilicate  10 g Oral STAT   sodium zirconium cyclosilicate  10 g Oral TID   Continuous Infusions:  pantoprazole  pantoprazole 8 mg/hr (07/08/23 0237)   PRN Meds:.acetaminophen **OR** acetaminophen, senna-docusate Medications Prior to Admission:  Prior to Admission medications   Medication Sig Start Date End Date Taking? Authorizing Provider  acetaminophen (TYLENOL) 650 MG CR tablet Take 650 mg by mouth every 8 (eight) hours as needed for pain.     [provider]  albuterol (PROVENTIL HFA;VENTOLIN HFA) 108 (90 Base) MCG/ACT inhaler Inhale 1-2 puffs  into the lungs every 6 (six) hours as needed for wheezing or shortness of breath. 08/04/18   Riccio, Marcell Anger, DO  apixaban (ELIQUIS) 2.5 MG TABS tablet Take 1 tablet (2.5 mg total) by mouth 2 (two) times daily. 09/03/21   [facility], Roe Rutherford, PA  bimatoprost (LUMIGAN) 0.01 % SOLN Place 1 drop into both eyes at bedtime.    [provider]  brimonidine (ALPHAGAN) 0.2 % ophthalmic solution Place 1 drop into the right eye in the morning and at bedtime.    [provider]  donepezil (ARICEPT) 10 MG tablet Take 10 mg by mouth at bedtime.    [provider]  Epoetin Alfa-epbx (RETACRIT IJ) Inject 1 Dose as directed once a week.     [provider]  ferrous sulfate 325 (65 FE) MG tablet TAKE 1 TABLET BY MOUTH TWICE DAILY WITH MEALS TO KEEP BLOOD COUNT UP Patient taking differently: Take 325 mg by mouth daily. 08/26/16   Tillman Sers, DO  guaiFENesin (MUCINEX) 600 MG 12 hr tablet Take 600 mg by mouth every 12 (twelve) hours as needed (congestion.).    [provider]  ipratropium-albuterol (DUONEB) 0.5-2.5 (3) MG/3ML SOLN USE 3 ML VIA NEBULIZER EVERY 6 HOURS Patient taking differently: Take 3 mLs by nebulization every 6 (six) hours as needed (shortness of breath/wheezing.). 10/21/18   Tillman Sers, DO  Menthol, Topical Analgesic, (BIOFREEZE EX) Apply 1 Application topically 2 (two) times daily as needed (aches/pain.).    [provider]  metoprolol succinate (TOPROL XL) 25 MG 24 hr tablet Take 0.5 tablets (12.5 mg total) by mouth daily. 08/11/22   Laurey Morale, MD  mirtazapine (REMERON) 7.5 MG tablet Take 7.5 mg by mouth at bedtime.    [provider]  nitroGLYCERIN (NITROSTAT) 0.4 MG SL tablet Place 1 tablet (0.4 mg total) under the tongue every 5 (five) minutes as needed for chest pain. 08/04/18   Tillman Sers, DO  Nutritional Supplements (ENSURE PLUS PO) Take 237 mLs by mouth in the morning, at noon, and at bedtime.    [provider]  potassium chloride SA (KLOR-CON M) 20 MEQ tablet Take 20 mEq by mouth daily.    [provider]  predniSONE (DELTASONE) 5 MG tablet Take 5 mg by mouth daily.    [provider]  rosuvastatin (CRESTOR) 40 MG tablet Take 1 tablet (40 mg total) by mouth at bedtime. 05/29/23   Alver Sorrow, NP  STIMULANT LAXATIVE 8.6-50 MG tablet Take 2 tablets by mouth at bedtime. 11/22/19   [provider]  torsemide (DEMADEX) 20 MG tablet Take 2 tablets (40 mg total) by mouth daily. 05/29/23   Alver Sorrow, NP  traMADol (ULTRAM) 50 MG tablet Take 1 tablet (50 mg total) by mouth every 6 (six) hours as needed for moderate pain. 06/02/23 06/01/24  Baglia, Corrina, PA-C   Allergies  Allergen Reactions   Ace Inhibitors Swelling    angiodema   Other Anaphylaxis and Swelling    Reaction to unspecified anesthesia    Propofol  Dysrhythmia likely to drop in afterload post induction with Propofol. Did not have anaphylactic symptoms with MAC in 2018   Review of Systems  Physical Exam  Vital Signs: BP 131/69 (BP Location: Right Arm)   Pulse 70   Temp 97.9 F (36.6 C)   Resp 16   SpO2 99%  Pain Scale: 0-10   Pain Score: 0-No pain   SpO2: SpO2: 99 % O2 Device:SpO2: 99 % O2 Flow Rate: .O2 Flow Rate (L/min): 5 L/min  IO: Intake/output summary:  Intake/Output Summary (Last 24 hours) at 07/08/2023 0920 Last data filed at 07/08/2023 0800 Gross per 24 hour  Intake 1441 ml  Output 0 ml  Net 1441 ml    LBM: Last BM Date : 07/08/23 Baseline Weight:   Most recent weight:       Palliative Assessment/Data: 50 %     Time  75 minutes  Discussed with Dr. Melanee Spry    Signed by: Lorinda Creed, NP   Please contact Palliative Medicine Team phone at [phone] for questions and concerns.  For individual provider: See Loretha Stapler

## 2023-07-08 NOTE — Progress Notes (Addendum)
New Admission Note:  Arrival Method: By bed from ED around 0010 Mental Orientation: Alert and oriented x 1 Telemetry: Box 22, CCMD notified Assessment: Completed Skin: Completed, refer to flowsheets IV: R forearm Pain: Denies Tubes: None Safety Measures: Safety Fall Prevention Plan was given, discussed and signed. Admission: Completed [facility] Orientation: Patient has been orientated to the room, unit and the staff. Family: None  Orders have been reviewed and implemented. Will continue to monitor the patient. Call light has been placed within reach and bed alarm has been activated.   Franky Macho, RN  Phone Number: [phone]

## 2023-07-08 NOTE — Progress Notes (Signed)
PT Cancellation Note  Patient Details Name: [name] MRN: [id number] DOB: 1946/10/16   Cancelled Treatment:    Reason Eval/Treat Not Completed: Patient at procedure or test/unavailable. Pt at HDU, PT will follow up as time allows.   Arlyss Gandy 07/08/2023, 1:32 PM

## 2023-07-08 NOTE — ED Provider Notes (Signed)
[facility] Provider Note   CSN: [id number] Arrival date & time: 07/07/23  1231     History  Chief Complaint  Patient presents with   Failure To Thrive    [name] is a 77 y.o. female.  77 year old female with a history of atrial fibrillation on Eliquis, mild dementia, COPD, tracheostomy dependence, gastric and small intestine AVM, CHF, and ESRD who recently started dialysis who presents to the emergency department with generalized weakness.  History obtained predominantly per the patient's family states that over the past few weeks has had decreased p.o. intake and increased weakness.  She also has been having some dark stool over the past few days.  They state that she occasionally has been confused recently as well.         Home Medications Prior to Admission medications   Medication Sig Start Date End Date Taking? Authorizing Provider  acetaminophen (TYLENOL) 650 MG CR tablet Take 650 mg by mouth as needed for pain.   Yes [provider]  albuterol (PROVENTIL HFA;VENTOLIN HFA) 108 (90 Base) MCG/ACT inhaler Inhale 1-2 puffs into the lungs every 6 (six) hours as needed for wheezing or shortness of breath. Patient taking differently: Inhale 2 puffs into the lungs every 6 (six) hours as needed for wheezing or shortness of breath. 08/04/18  Yes Riccio, Marcell Anger, DO  apixaban (ELIQUIS) 2.5 MG TABS tablet Take 1 tablet (2.5 mg total) by mouth 2 (two) times daily. 09/03/21  Yes Duke, Roe Rutherford, PA  bimatoprost (LUMIGAN) 0.01 % SOLN Place 1 drop into both eyes at bedtime.   Yes [provider]  brimonidine (ALPHAGAN) 0.2 % ophthalmic solution Place 1 drop into the right eye in the morning and at bedtime.   Yes [provider]  donepezil (ARICEPT) 10 MG tablet Take 10 mg by mouth in the morning.   Yes [provider]  Epoetin Alfa-epbx (RETACRIT IJ) Inject 1 Dose as directed once a week.    Yes [provider]   ferrous sulfate 325 (65 FE) MG tablet TAKE 1 TABLET BY MOUTH TWICE DAILY WITH MEALS TO KEEP BLOOD COUNT UP Patient taking differently: Take 325 mg by mouth daily. 08/26/16  Yes Riccio, Angela C, DO  ipratropium-albuterol (DUONEB) 0.5-2.5 (3) MG/3ML SOLN USE 3 ML VIA NEBULIZER EVERY 6 HOURS Patient taking differently: Take 3 mLs by nebulization as needed (shortness of breath/wheezing.). 10/21/18  Yes Riccio, Angela C, DO  Menthol, Topical Analgesic, (BIOFREEZE EX) Apply 1 Application topically as needed (aches/pain.).   Yes [provider]  metoprolol succinate (TOPROL XL) 25 MG 24 hr tablet Take 0.5 tablets (12.5 mg total) by mouth daily. 08/11/22  Yes Laurey Morale, MD  mirtazapine (REMERON) 7.5 MG tablet Take 7.5 mg by mouth at bedtime.   Yes [provider]  nitroGLYCERIN (NITROSTAT) 0.4 MG SL tablet Place 1 tablet (0.4 mg total) under the tongue every 5 (five) minutes as needed for chest pain. 08/04/18  Yes Riccio, Marcell Anger, DO  Nutritional Supplements (ENSURE PLUS PO) Take 237 mLs by mouth in the morning, at noon, and at bedtime.   Yes [provider]  potassium chloride SA (KLOR-CON M) 20 MEQ tablet Take 20 mEq by mouth daily.   Yes [provider]  predniSONE (DELTASONE) 5 MG tablet Take 5 mg by mouth daily.   Yes [provider]  rosuvastatin (CRESTOR) 40 MG tablet Take 1 tablet (40 mg total) by mouth at bedtime. 05/29/23  Yes  Alver Sorrow, NP  sennosides-docusate sodium (SENOKOT-S) 8.6-50 MG tablet Take 2 tablets by mouth at bedtime.   Yes [provider]  torsemide (DEMADEX) 20 MG tablet Take 2 tablets (40 mg total) by mouth daily. 05/29/23  Yes Alver Sorrow, NP  traMADol (ULTRAM) 50 MG tablet Take 1 tablet (50 mg total) by mouth every 6 (six) hours as needed for moderate pain. Patient taking differently: Take 50 mg by mouth as needed for moderate pain. 06/02/23 06/01/24 Yes Baglia, Corrina, PA-C      Allergies    Ace  inhibitors, Other, and Propofol    Review of Systems   Review of Systems  Physical Exam Updated Vital Signs BP 131/69 (BP Location: Right Arm)   Pulse 70   Temp 97.9 F (36.6 C)   Resp 16   SpO2 99%  Physical Exam Vitals and nursing note reviewed.  Constitutional:      General: She is not in acute distress.    Appearance: She is well-developed.  HENT:     Head: Normocephalic and atraumatic.     Right Ear: External ear normal.     Left Ear: External ear normal.     Nose: Nose normal.  Eyes:     Extraocular Movements: Extraocular movements intact.     Conjunctiva/sclera: Conjunctivae normal.     Pupils: Pupils are equal, round, and reactive to light.  Neck:     Comments: Tracheostomy present Cardiovascular:     Rate and Rhythm: Normal rate. Rhythm irregular.     Heart sounds: Murmur heard.  Pulmonary:     Effort: Pulmonary effort is normal. No respiratory distress.     Breath sounds: Normal breath sounds.  Abdominal:     General: Abdomen is flat. There is no distension.     Palpations: Abdomen is soft. There is no mass.     Tenderness: There is no abdominal tenderness. There is no guarding.  Genitourinary:    Comments: Rectal exam chaperoned by patient's RN.  Melanotic stool that was Hemoccult positive noted. Musculoskeletal:     Cervical back: Normal range of motion and neck supple.     Right lower leg: No edema.     Left lower leg: No edema.     Comments: Fistula in left upper extremity with bruit and thrill  Skin:    General: Skin is warm and dry.  Neurological:     General: No focal deficit present.     Mental Status: She is alert. Mental status is at baseline.     Cranial Nerves: No cranial nerve deficit.     Sensory: No sensory deficit.     Motor: No weakness.     Comments: Alert and oriented to self only.  Psychiatric:        Mood and Affect: Mood normal.     ED Results / Procedures / Treatments   Labs (all labs ordered are listed, but only abnormal  results are displayed) Labs Reviewed  COMPREHENSIVE METABOLIC PANEL - Abnormal; Notable for the following components:      Result Value   Potassium 7.1 (*)    CO2 10 (*)    Glucose, Bld 150 (*)    BUN 119 (*)    Creatinine, Ser 7.81 (*)    Calcium 8.7 (*)    Total Protein 6.2 (*)    Albumin 2.9 (*)    AST 167 (*)    GFR, Estimated 5 (*)    Anion gap 20 (*)  All other components within normal limits  LIPASE, BLOOD - Abnormal; Notable for the following components:   Lipase 52 (*)    All other components within normal limits  CBC WITH DIFFERENTIAL/PLATELET - Abnormal; Notable for the following components:   WBC 13.8 (*)    RBC 2.22 (*)    Hemoglobin 5.8 (*)    HCT 22.9 (*)    MCV 103.2 (*)    MCHC 25.3 (*)    RDW 20.3 (*)    nRBC 0.7 (*)    Neutro Abs 13.0 (*)    Lymphs Abs 0.2 (*)    Abs Immature Granulocytes 0.09 (*)    All other components within normal limits  IRON AND TIBC - Abnormal; Notable for the following components:   Iron 13 (*)    Saturation Ratios 4 (*)    All other components within normal limits  RENAL FUNCTION PANEL - Abnormal; Notable for the following components:   Potassium 6.3 (*)    CO2 11 (*)    Glucose, Bld 280 (*)    BUN 120 (*)    Creatinine, Ser 7.74 (*)    Calcium 8.5 (*)    Phosphorus 6.7 (*)    Albumin 2.5 (*)    GFR, Estimated 5 (*)    Anion gap 19 (*)    All other components within normal limits  CBC - Abnormal; Notable for the following components:   WBC 13.1 (*)    RBC 1.83 (*)    Hemoglobin 4.7 (*)    HCT 17.4 (*)    MCH 25.7 (*)    MCHC 27.0 (*)    RDW 20.2 (*)    nRBC 0.6 (*)    All other components within normal limits  CBC - Abnormal; Notable for the following components:   WBC 13.2 (*)    RBC 3.48 (*)    Hemoglobin 9.9 (*)    HCT 31.2 (*)    RDW 16.2 (*)    nRBC 0.3 (*)    All other components within normal limits  COMPREHENSIVE METABOLIC PANEL - Abnormal; Notable for the following components:   Glucose, Bld 100  (*)    BUN 59 (*)    Creatinine, Ser 4.94 (*)    Calcium 8.2 (*)    Total Protein 5.7 (*)    Albumin 2.7 (*)    AST 174 (*)    GFR, Estimated 8 (*)    All other components within normal limits  PROTIME-INR - Abnormal; Notable for the following components:   Prothrombin Time 18.9 (*)    INR 1.6 (*)    All other components within normal limits  POC OCCULT BLOOD, ED - Abnormal; Notable for the following components:   Fecal Occult Bld POSITIVE (*)    All other components within normal limits  HEPATITIS B SURFACE ANTIGEN  FERRITIN  URINALYSIS, ROUTINE W REFLEX MICROSCOPIC  HEPATITIS B SURFACE ANTIBODY, QUANTITATIVE  PARATHYROID HORMONE, INTACT (NO CA)  PARATHYROID HORMONE, INTACT (NO CA)  TYPE AND SCREEN  PREPARE RBC (CROSSMATCH)  PREPARE RBC (CROSSMATCH)    EKG EKG Interpretation Date/Time:  Tuesday July 07 2023 12:42:51 EDT Ventricular Rate:  70 PR Interval:    QRS Duration:  225 QT Interval:  538 QTC Calculation: 581 R Axis:   -78  Text Interpretation: VENTRICULAR PACED RHYTHM Prolonged QT interval  Confirmed by Vonita Moss [phone]) on 07/07/2023 4:47:28 PM  Radiology DG Chest Port 1 View  Result Date: 07/07/2023 CLINICAL DATA:  Failure to thrive  EXAM: PORTABLE CHEST 1 VIEW COMPARISON:  Previous studies including the examination of 04/23/2020 FINDINGS: Transverse diameter of heart is increased. There are no signs of alveolar pulmonary edema or focal pulmonary consolidation. Tip of tracheostomy is 7.7 cm above the carina. Pacemaker battery is seen in right infraclavicular region. There is no pleural effusion or pneumothorax. IMPRESSION: Cardiomegaly. There are no signs of pulmonary edema or focal pulmonary consolidation. Electronically Signed   By: Ernie Avena M.D.   On: 07/07/2023 15:27    Procedures Procedures    Medications Ordered in ED Medications  0.9 %  sodium chloride infusion (Manually program via Guardrails IV Fluids) (has no administration in  time range)  Chlorhexidine Gluconate Cloth 2 % PADS 6 each (has no administration in time range)  acetaminophen (TYLENOL) tablet 650 mg (has no administration in time range)    Or  acetaminophen (TYLENOL) suppository 650 mg (has no administration in time range)  senna-docusate (Senokot-S) tablet 1 tablet (has no administration in time range)  donepezil (ARICEPT) tablet 10 mg (10 mg Oral Given 07/08/23 0117)  metoprolol succinate (TOPROL-XL) 24 hr tablet 12.5 mg (12.5 mg Oral Given 07/08/23 0806)  mirtazapine (REMERON) tablet 7.5 mg (7.5 mg Oral Given 07/08/23 0117)  rosuvastatin (CRESTOR) tablet 40 mg (40 mg Oral Given 07/08/23 0116)  pantoprazole (PROTONIX) 80 mg /NS 100 mL IVPB (has no administration in time range)  pantoprozole (PROTONIX) 80 mg /NS 100 mL infusion (8 mg/hr Intravenous New Bag/Given 07/08/23 1128)  0.9 %  sodium chloride infusion (Manually program via Guardrails IV Fluids) (has no administration in time range)  0.9 %  sodium chloride infusion ( Intravenous New Bag/Given 07/07/23 1554)  furosemide (LASIX) injection 40 mg (40 mg Intravenous Given 07/07/23 1928)  sodium chloride 0.9 % bolus 500 mL (500 mLs Intravenous New Bag/Given 07/07/23 1920)  calcium gluconate 1 g/ 50 mL sodium chloride IVPB (1,000 mg Intravenous New Bag/Given 07/07/23 1932)  albuterol (PROVENTIL) (2.5 MG/3ML) 0.083% nebulizer solution 15 mg (15 mg Nebulization Given 07/07/23 1740)  insulin aspart (novoLOG) injection 5 Units (5 Units Intravenous Given 07/07/23 1922)    And  dextrose 50 % solution 50 mL (50 mLs Intravenous Given 07/07/23 1923)  sodium bicarbonate 150 mEq in dextrose 5 % 1,150 mL infusion ( Intravenous Stopped 07/08/23 0128)    ED Course/ Medical Decision Making/ A&P Clinical Course as of 07/08/23 1239  Tue Jul 07, 2023  1723 Dr Signe Colt from nephrology aware and will arrange for emergent dialysis. [RP]  1739 Dr Ned Card from medicine to admit. [RP]  1752 Dr Archer Asa from GI consulted.  Recommends PPI  drip and will see the patient and evaluate for endoscopy. [RP]    Clinical Course User Index [RP] Rondel Baton, MD                             Medical Decision Making Amount and/or Complexity of Data Reviewed Labs: ordered. Radiology: ordered.  Risk OTC drugs. Prescription drug management. Decision regarding hospitalization.   [name] is a 77 y.o. female with comorbidities that complicate the patient evaluation including atrial fibrillation on Eliquis, mild dementia, COPD, tracheostomy dependence, gastric and small intestine AVM, CHF, and ESRD who recently started dialysis who presents to the emergency department with generalized weakness.    Initial Ddx:  Failure to thrive, dehydration, acute on chronic kidney injury, electrolyte abnormality, UTI, GI bleed  MDM/Course:  Patient presents emergency department with several weeks of failure  to thrive, confusion, and several days of dark stool.  Appears to be close to her mental baseline at this time and has a nonfocal neurologic exam.  He is hemodynamically stable but does have grossly melanotic stool.  Labs returned and showed that she had a hemoglobin of 5.8.  Also showed that she had a potassium of 7.1 with a paced rhythm.  Was given calcium, fluids, Lasix, insulin, and D50.  Also given Lokelma.  Nephrology was consulted for emergent dialysis for the patient.  Patient was ordered for 2 units of blood and GI was consulted.  They will evaluate the patient in the morning for EGD.  The patient was started on a Protonix drip.  Upon re-evaluation patient remained stable.  Since she is not acutely decompensating is at high risk for stroke we will hold off on reversing her anticoagulation at this time.  Verified full code with the patient's family.  Did place a palliative consult especially since she is elderly, has dementia, ESRD, and is on a blood thinner with known history of bleeds.  Do feel that she would benefit from goals of  care discussions sooner rather than later.  This patient presents to the ED for concern of complaints listed in HPI, this involves an extensive number of treatment options, and is a complaint that carries with it a high risk of complications and morbidity. Disposition including potential need for admission considered.   Dispo: Admit to Floor  Additional history obtained from daughter Records reviewed Outpatient Clinic Notes The following labs were independently interpreted: Chemistry and CBC and show acute anemia, CKD, and hyper kalemia I personally reviewed and interpreted cardiac monitoring:  Paced rhythm I personally reviewed and interpreted the pt's EKG: see above for interpretation  I have reviewed the patients home medications and made adjustments as needed Consults: Gastroenterology, Hospitalist, and Nephrology Social Determinants of health:  Elderly  CRITICAL CARE Performed by: Rondel Baton   Total critical care time: 60 minutes  Critical care time was exclusive of separately billable procedures and treating other patients.  Critical care was necessary to treat or prevent imminent or life-threatening deterioration.  Critical care was time spent personally by me on the following activities: development of treatment plan with patient and/or surrogate as well as nursing, discussions with consultants, evaluation of patient's response to treatment, examination of patient, obtaining history from patient or surrogate, ordering and performing treatments and interventions, ordering and review of laboratory studies, ordering and review of radiographic studies, pulse oximetry and re-evaluation of patient's condition.  Final Clinical Impression(s) / ED Diagnoses Final diagnoses:  AKI (acute kidney injury) (HCC)  Hyperkalemia  Failure to thrive in adult  Anemia, unspecified type  Gastrointestinal hemorrhage, unspecified gastrointestinal hemorrhage type    Rx / DC Orders ED  Discharge Orders     None         Rondel Baton, MD 07/08/23 1239

## 2023-07-08 NOTE — Progress Notes (Signed)
Requested to see pt for out-pt HD needs at d/c. Pt has a trach from prior to admission. Out-pt HD clinics cannot accept a trach pt unless trach is capped. Will follow and assist with clinic placement once pt's trach can be capped and documentation can reflect that information. Case discussed with nephrologist. Will assist as needed.   Olivia Canter Renal Navigator [phone]

## 2023-07-08 NOTE — Progress Notes (Signed)
Nephrology Follow-Up Consult note   Assessment/Recommendations: [name] is a/an 77 y.o. female with a past medical history significant for CKD V, CHF, GI bleeding/AVMs, DM2, afib, dementia, frailty, admitted for GI bleeding and ARF.       AKI on CKD V --> ESRD.  Poor candidate but after extensive discussions family wants to continue pursuing dialysis             - Continue with 2/3 initiation sessions today  - start ESRD CLIP; likely will require LTACH            - continue with AVG, advance needle size             - pal care involved   2.  Hyperkalemia: now resolved             - stop lokelma   3.  Anemia:             - suspect GI bleed on top of anemia of renal failure             - Transfusion per primary team  - GI with plans for EGD  - consider ESA   4.  BMM             - F/u PTH and CTM phos with HD   5.  Dementia:             - on Aricept             - agree with pall care c/s   6.  Dispo: floor   Recommendations conveyed to primary service.    [name] [facility] 07/08/2023 10:57 AM  ___________________________________________________________  CC: weakness, AMS  Interval History/Subjective: Patient tolerated dialysis yesterday with no issues.  Metabolic parameters improved this morning.  Patient has no complaints.   Medications:  Current Facility-Administered Medications  Medication Dose Route Frequency Provider Last Rate Last Admin   0.9 %  sodium chloride infusion (Manually program via Guardrails IV Fluids)   Intravenous Once Rondel Baton, MD       0.9 %  sodium chloride infusion (Manually program via Guardrails IV Fluids)   Intravenous Once Champ Mungo, DO       acetaminophen (TYLENOL) tablet 650 mg  650 mg Oral Q6H PRN Atway, [name], DO       Or   acetaminophen (TYLENOL) suppository 650 mg  650 mg Rectal Q6H PRN Atway, [name], DO       Chlorhexidine Gluconate Cloth 2 % PADS 6 each  6 each Topical Q0600 Bufford Buttner, MD       donepezil (ARICEPT) tablet 10 mg  10 mg Oral QHS Atway, [name], DO   10 mg at 07/08/23 0117   metoprolol succinate (TOPROL-XL) 24 hr tablet 12.5 mg  12.5 mg Oral Daily Atway, [name], DO   12.5 mg at 07/08/23 0806   mirtazapine (REMERON) tablet 7.5 mg  7.5 mg Oral QHS Atway, [name], DO   7.5 mg at 07/08/23 0117   pantoprazole (PROTONIX) 80 mg /NS 100 mL IVPB  80 mg Intravenous Once Rondel Baton, MD       pantoprozole (PROTONIX) 80 mg /NS 100 mL infusion  8 mg/hr Intravenous Continuous Rondel Baton, MD 10 mL/hr at 07/08/23 0237 8 mg/hr at 07/08/23 0237   rosuvastatin (CRESTOR) tablet 40 mg  40 mg Oral QHS Atway, [name], DO   40 mg at 07/08/23 0116  senna-docusate (Senokot-S) tablet 1 tablet  1 tablet Oral QHS PRN Atway, [name], DO       sodium zirconium cyclosilicate (LOKELMA) packet 10 g  10 g Oral STAT Rondel Baton, MD       sodium zirconium cyclosilicate (LOKELMA) packet 10 g  10 g Oral TID Bufford Buttner, MD   10 g at 07/08/23 2725      Review of Systems: 10 systems reviewed and negative except per interval history/subjective  Physical Exam: Vitals:   07/08/23 0522 07/08/23 0838  BP: (!) 141/68 131/69  Pulse: 70 70  Resp: 18 16  Temp: 97.6 F (36.4 C) 97.9 F (36.6 C)  SpO2: 98% 99%   Total I/O In: 120 [P.O.:120] Out: -   Intake/Output Summary (Last 24 hours) at 07/08/2023 1057 Last data filed at 07/08/2023 0800 Gross per 24 hour  Intake 1441 ml  Output 0 ml  Net 1441 ml   Constitutional: lying in bed, no acute distress ENMT: ears and nose without scars or lesions, MMM, trach in place CV: normal rate, no edema Respiratory: coarse upper airway sounds, normal work of breathing Gastrointestinal: soft, non-tender, no palpable masses or hernias Skin: no visible lesions or rashes Psych: alert, judgement/insight impaired, appropriate mood and affect   Test Results I personally reviewed new and old clinical labs and radiology  tests Lab Results  Component Value Date   NA 135 07/08/2023   K 4.1 07/08/2023   CL 101 07/08/2023   CO2 22 07/08/2023   BUN 59 (H) 07/08/2023   CREATININE 4.94 (H) 07/08/2023   GFR 16.19 (L) 10/01/2021   GLU 96 07/28/2016   CALCIUM 8.2 (L) 07/08/2023   ALBUMIN 2.7 (L) 07/08/2023   PHOS 6.7 (H) 07/07/2023    CBC Recent Labs  Lab 07/07/23 1545 07/07/23 2031 07/08/23 0841  WBC 13.8* 13.1* 13.2*  NEUTROABS 13.0*  --   --   HGB 5.8* 4.7* 9.9*  HCT 22.9* 17.4* 31.2*  MCV 103.2* 95.1 89.7  PLT 304 285 197

## 2023-07-08 NOTE — Progress Notes (Addendum)
Subjective: [name] is a 77 year-old female who has a PMHx of of complete heart block s/p Abbott PPM in 1986, atrial fibrillation, HTN, aortic atherosclerosis, laryngeal stenosis s/p tracheostomy, CVA 04/2019, GI bleed 2012, AVMs, HCM/chronic congestive heart failure, ESRD s/p fistula, COPD who presented with decreased PO intake and melena found to have acute on chronic anemia and GI bleed   She continues to remain oriented to self, frail on exam, and mildly short of breath. She received 3u pRBC transfusion overnight with improvement in Hb to now 9.9. Additionally she went for overnight dialysis for 2 hour session using her AV fistula. She is scheduled for enteroscopy with GI on 7/25 for cause of GI bleed. Daughter was present in the room.    Objective: Vital signs in last 24 hours: Vitals:   07/08/23 1500 07/08/23 1530 07/08/23 1600 07/08/23 1630  BP: 118/61 122/75 116/61 112/60  Pulse: 71 70 69 70  Resp: 16 19 20 19   Temp:      TempSrc:      SpO2: 100% 100% 99% 100%  Weight:       Weight change:   Intake/Output Summary (Last 24 hours) at 07/08/2023 1639 Last data filed at 07/08/2023 0800 Gross per 24 hour  Intake 1441 ml  Output 0 ml  Net 1441 ml    Physical Exam: General appearance: frail and ill appearing elderly female, awake and alert, NAD Eyes: conjunctivae/corneas clear. PERRL, EOM's intact Neck:  tracheostomy present Lungs: CTAB, normal WOB  Heart: RRR, S1, S2 normal, no m/r/g Abdomen: soft, non-tender; bowel sounds normal; no masses, no organomegaly Extremities: sarcopenic, no LE edema. LUE AV graft fistula  Neurologic: oriented to self, mildy confused.   Lab Results:    Latest Ref Rng & Units 07/08/2023    8:41 AM 07/07/2023    8:31 PM 07/07/2023    3:45 PM  CBC  WBC 4.0 - 10.5 K/uL 13.2  13.1  13.8   Hemoglobin 12.0 - 15.0 g/dL 9.9  4.7  5.8   Hematocrit 36.0 - 46.0 % 31.2  17.4  22.9   Platelets 150 - 400 K/uL 197  285  304       Latest Ref Rng  & Units 07/08/2023    8:41 AM 07/07/2023    8:31 PM 07/07/2023    3:45 PM  CMP  Glucose 70 - 99 mg/dL 295  621  308   BUN 8 - 23 mg/dL 59  657  846   Creatinine 0.44 - 1.00 mg/dL 9.62  9.52  8.41   Sodium 135 - 145 mmol/L 135  135  135   Potassium 3.5 - 5.1 mmol/L 4.1  6.3  7.1   Chloride 98 - 111 mmol/L 101  105  105   CO2 22 - 32 mmol/L 22  11  10    Calcium 8.9 - 10.3 mg/dL 8.2  8.5  8.7   Total Protein 6.5 - 8.1 g/dL 5.7   6.2   Total Bilirubin 0.3 - 1.2 mg/dL 0.5   0.5   Alkaline Phos 38 - 126 U/L 48   48   AST 15 - 41 U/L 174   167   ALT 0 - 44 U/L 39   39    No results for input(s): "GLUCAP" in the last 72 hours.  Micro Results: No results found for this or any previous visit (from the past 240 hour(s)).  Studies/Results: DG Chest Port 1 View  Result Date: 07/07/2023 CLINICAL DATA:  Failure to thrive EXAM: PORTABLE CHEST 1 VIEW COMPARISON:  Previous studies including the examination of 04/23/2020 FINDINGS: Transverse diameter of heart is increased. There are no signs of alveolar pulmonary edema or focal pulmonary consolidation. Tip of tracheostomy is 7.7 cm above the carina. Pacemaker battery is seen in right infraclavicular region. There is no pleural effusion or pneumothorax. IMPRESSION: Cardiomegaly. There are no signs of pulmonary edema or focal pulmonary consolidation. Electronically Signed   By: Ernie Avena M.D.   On: 07/07/2023 15:27    Medications: I have reviewed the patient's current medications. Scheduled Meds:  sodium chloride   Intravenous Once   sodium chloride   Intravenous Once   Chlorhexidine Gluconate Cloth  6 each Topical Q0600   donepezil  10 mg Oral QHS   metoprolol succinate  12.5 mg Oral Daily   mirtazapine  7.5 mg Oral QHS   rosuvastatin  40 mg Oral QHS   Continuous Infusions:  pantoprazole     pantoprazole 8 mg/hr (07/08/23 1128)   PRN Meds:.acetaminophen **OR** acetaminophen, senna-docusate  Assessment/Plan: Principal Problem:    Acute on chronic anemia Active Problems:   Melena   ESRD on dialysis (HCC)   Acute encephalopathy  [name] is a 77 year-old female who has a PMHx of of complete heart block s/p Abbott PPM in 1986, atrial fibrillation, HTN, aortic atherosclerosis, laryngeal stenosis s/p tracheostomy, CVA 04/2019, GI bleed 2012, AVMs, HCM/chronic congestive heart failure, ESRD s/p fistula, COPD who presented with decreased PO intake and melena found to have acute on chronic anemia and GI bleed   Active Problems   #Melena  H/o small bowel AVMs #Decreased PO intake #Acute on chronic anemia, improving  Hx of chronic anemia in the setting of ESRD, who presented with melena c/f acute blood loss. Also has hx of GI bleed in 2012 and reported small bowel AVMs in 2022 treated with APC. Colonoscopy in 2021 showed multiple polyps in colon that were resected. No cause for anemia was noted in the colon but was thought to be related to small bowel AVM. Hb 9.9 s/p 3u pRBCs transfusions. She has historically required blood transfusions for her anemia. Iron levels low at 38, Tsat 4%,  with ferritin 76. -GI following, appreciate recs, see below: -NPO midnight for procedures -Small bowel enteroscopy planned 7/25 with GI -Cont IV protonix 80mg  continuous infusion   -CBC daily   -If Hb < 7, transfuse with 1u pRBCs -Hold Eliquis  -Consider IV iron replacement following procedure    #ESRD s/p AV graft fistula #Hyperkalemia, resolved   #Elevated BUN  Left UE AV graft placement on 06/01/23. Unclear her baseline Cr but likely ~2-3 . Etiology for her renal failure unknown but suspected to be secondary to HTN. Went for urgent dialysis yesterday, 7/23 for 2 hours. K 4.1 following dialysis and interventional measures   -Nephro following, planning for 2nd additional dialysis session today (completed 1 of 3, 7/23)  -Daily BMP  -PTH pending  -Discontinue Lokelma   #Dementia  Reported onset of dementia in 2021. Previously  able to perform ADLs however over the last few weeks patient has deviated from her baseline and requires the support of her family members to care for her. She currently is oriented to person but not to place and appearing confused on exam. BUN significantly elevated on initial exam likely worsening her mental status.  -Continue home donepezil 10mg  daily   -Consult palliative care for goals of care  -PT    Chronic Problems   #  Atrial fibrillation #HLD #H/o stroke Reported stroke on 04/2019 with residual expressive aphasia. Afib has since been rate controlled with Metoprolol succinate 12.5 daily. Last lipid panel showed LDL 55 (05/29/23) -Cont home rosuvastatin 40mg   -Continue metoprolol succinate 12.5mg  daily   -Hold Eliquis iso melena   #Chronic CHF  HCM  #Complete heart block s/p PPM (1986)  Follows with HF clinic. Home GDMT incldues Metoprolol succinate 12.5mg  daily, Torsemide 40mg  every day. Last ECHO 09/2021 LVEF 40-45%   -Continue Metoprolol succinate  -Hold diuretic iso dialysis     #HTN: Well-controlled at home -Continue antihypertensives as stated above   This is a Psychologist, occupational Note.  The care of the patient was discussed with Dr. Sol Blazing and the assessment and plan formulated with their assistance.  Please see their attached note for official documentation of the daily encounter.   LOS: 1 day   Loyal Buba 07/08/2023, 4:39 PM

## 2023-07-08 NOTE — Consult Note (Signed)
Consultation  Referring Provider:  [facility]  Primary Care Physician:  [facility] Primary Gastroenterologist:  Dr. Lavon Paganini     Reason for Consultation:     Acute on chronic anemia  LOS: 1 day          HPI:   [name] is a 77 y.o. female with past medical history significant for A-fib (on Eliquis), ESRD s/p fistula (just started dialysis), COPD, CVA, chronic congestive heart failure, laryngeal stenosis s/p tracheostomy, chronic GI blood loss secondary to AVMs presents for evaluation of symptomatic anemia and failure to thrive.  History obtained through chart review as patient is disoriented.  History of laryngeal stenosis secondary to tracheostomy that was done after respiratory failure following propofol administration during Watchman procedure  History of small bowel AVMs and has undergone multiple procedures with treatment (see below).  Most recent procedure was a small bowel endoscopy 09/2021 which found 7 angiodysplastic lesions throughout stomach, duodenum, jejunum that were treated with APC  Patient was at dialysis yesterday (7/23) discussing getting set up for her first session and felt ill so she was recommended to come to the ED.  Last dose of Eliquis 7/23 AM  Pertinent lab values Hgb 4.7 (10.5 06/02/2023) received 3 units PRBCs yesterday with improved to Hgb 9.9 MCV 89.7 Potassium initially 6.3, underwent 2 hours of dialysis last night with improvement to 4.1 BUN 120, creatinine 7.74, GFR 5 initially, now improved with BUN 59, creatinine 4.94, GFR 8 Iron 13, TIBC 353, saturation 4% Fecal occult positive  Was able to get in contact with daughter and discussed little further.  Daughter states that patient typically is able to take care of herself and do activities of daily living.  She lives by herself.  She also is typically oriented.  Daughter states over the last 3 days patient has not been able to take care of herself, has been  needing help with taking medicines and using the bathroom.  Notes 2 to 3 days of melena and daughter states patient has been significantly more confused than usual.  Patient follows with [facility] a few days per week   PREVIOUS GI WORKUP---------------------------------------------------  Small bowel endoscopy 09/2021 for IDA and small bowel AVMs - Z- line regular.  - Normal esophagus otherwise  - A single non- bleeding angiodysplastic lesion in the stomach. Treated with argon plasma coagulation ( APC) .  - A single non- bleeding angiodysplastic lesion in the stomach. Treated with argon plasma coagulation ( APC) .  - Normal stomach otherwise  - Four non- bleeding angiodysplastic lesions in the duodenum. Treated with argon plasma coagulation ( APC) .  - A single non- bleeding angiodysplastic lesion in the jejunum. Treated with argon plasma coagulation ( APC) .  - An area in the distal most aspected of the jejunum was tattooed ( see above, after initial tattoo placed,  EGD 09/04/2020: - Z-line regular, 35 cm from the incisors. - Erosive gastropathy with no bleeding and no stigmata of recent bleeding. - Two angiodysplastic lesions typical arborization in the duodenum. Treated with argon plasma coagulation (APC). - The examination was otherwise normal. - No specimens collected.   Colonoscopy 09/04/2020: - Five 3 to 5 mm polyps in the transverse colon, removed with a cold snare. Resected and retrieved. - Three 1 to 2 mm polyps in the sigmoid colon and in the descending colon, removed with a cold biopsy forceps. Resected and retrieved. - Melanosis in the colon. -  Non-bleeding internal hemorrhoids. - The examination was otherwise normal. FINAL MICROSCOPIC DIAGNOSIS:   A. COLON, TRANSVERSE, POLYPECTOMY:  - Tubular adenoma (s).  - No high-grade dysplasia or carcinoma.   B. COLON, DESCENDING, SIGMOID, POLYPECTOMY:  - Tubular adenoma (s).  - No high-grade dysplasia or carcinoma.   EGD and  colonoscopy July 2018.  EGD was unremarkable and colonoscopy with finding of 7 polyps, the largest was 15 mm all removed.  Path consistent with tubular adenomas and follow-up in 3 years was recommended.   Small bowel capsule endoscopy in 2013: Negative but with fair to poor prep    Past Medical History:  Diagnosis Date  . Anemia   . Angioedema    2/2 ACE  . Arteriovenous malformation of stomach   . Arthritis   . Asthma   . AVM (arteriovenous malformation) of colon    small intestine; stomach  . Blood transfusion   . CHF (congestive heart failure) (HCC)   . CKD (chronic kidney disease) stage 4, GFR 15-29 ml/min (HCC) 09/24/2010   Baseline Cr 1.3-1.6  . Complete heart block (HCC)    s/p PPM 1998  . Complication of anesthesia    Difficult airway; anaphylaxis and swelling with propofol  . COPD (chronic obstructive pulmonary disease) (HCC)   . DDD (degenerative disc disease)   . Dementia (HCC)   . Depression   . Diabetes mellitus, type 2 (HCC)    pt's daughter states pt has never had diabetes  . Diastolic heart failure   . Fatty liver 07/26/2010  . GERD (gastroesophageal reflux disease)   . GI bleed   . History of alcohol abuse Stopped Fall 2012  . History of tobacco use Quit Fall 2012  . Hx of cardiovascular stress test    a. Lexiscan Myoview (10/15):  Small inferolateral and apical defect c/w scar and poss soft tissue attenuation, no ischemia, EF 42%  . Hx of colonic polyp 08/13/2010   adenomatous  . Hx of colonoscopy   . Hyperlipidemia   . Hypertension   . Hypertrophic cardiomyopathy (HCC)    dx by Dr Juanda Chance 2009  . Iron deficiency anemia   . Myocardial infarction (HCC)   . Pacemaker   . Panic attacks   . Permanent atrial fibrillation (HCC)   . Pneumonia   . Renal failure    baseline creatinine 1.6  . Right arm pain 01/08/2012  . RLS (restless legs syndrome)    Dx 06/2007  . Sleep apnea    no cpap   . Stroke [facility])     Surgical History:  She  has a past  surgical history that includes Tubal ligation (04/01/2000); Pacemaker insertion (1998); Cardiac catheterization; Esophagogastroduodenoscopy (12/23/2011); Givens capsule study (12/23/2011); Extubation (endotracheal) in or (N/A, 07/21/2016); Cardiac catheterization (N/A, 08/19/2016); Direct laryngoscopy (N/A, 09/18/2016); Microlaryngoscopy with laser (N/A, 10/16/2016); Direct laryngoscopy (N/A, 10/20/2016); Tracheostomy tube placement (N/A, 10/29/2016); Direct laryngoscopy (N/A, 10/29/2016); Colonoscopy with propofol (N/A, 06/16/2017); Esophagogastroduodenoscopy (egd) with propofol (N/A, 06/16/2017); Colonoscopy with propofol (N/A, 09/04/2020); Esophagogastroduodenoscopy (egd) with propofol (N/A, 09/04/2020); Hot hemostasis (N/A, 09/04/2020); polypectomy (09/04/2020); biopsy (09/04/2020); enteroscopy (N/A, 09/30/2021); Hot hemostasis (N/A, 09/30/2021); Submucosal tattoo injection (09/30/2021); AV fistula placement (Left, 09/09/2022); and AV fistula placement (Left, 06/02/2023). Family History:  Her family history includes Alcohol abuse in her brother and father; Aneurysm in her mother; CVA in her mother; Cancer in her brother; Colon cancer in her brother; Diabetes in her brother and sister; Heart attack in her brother, brother, and father; Heart disease in her daughter,  mother, and sister; Hypertension in her brother, father, mother, and sister; Stroke in her father and mother. Social History:   reports that she quit smoking about 11 years ago. Her smoking use included cigarettes. She started smoking about 61 years ago. She has a 5 pack-year smoking history. She has never used smokeless tobacco. She reports that she does not drink alcohol and does not use drugs.  Prior to Admission medications   Medication Sig Start Date End Date Taking? Authorizing Provider  acetaminophen (TYLENOL) 650 MG CR tablet Take 650 mg by mouth every 8 (eight) hours as needed for pain.     [provider]  albuterol (PROVENTIL HFA;VENTOLIN HFA)  108 (90 Base) MCG/ACT inhaler Inhale 1-2 puffs into the lungs every 6 (six) hours as needed for wheezing or shortness of breath. 08/04/18   Riccio, Marcell Anger, DO  apixaban (ELIQUIS) 2.5 MG TABS tablet Take 1 tablet (2.5 mg total) by mouth 2 (two) times daily. 09/03/21   [facility], Roe Rutherford, PA  bimatoprost (LUMIGAN) 0.01 % SOLN Place 1 drop into both eyes at bedtime.    [provider]  brimonidine (ALPHAGAN) 0.2 % ophthalmic solution Place 1 drop into the right eye in the morning and at bedtime.    [provider]  donepezil (ARICEPT) 10 MG tablet Take 10 mg by mouth at bedtime.    [provider]  Epoetin Alfa-epbx (RETACRIT IJ) Inject 1 Dose as directed once a week.     [provider]  ferrous sulfate 325 (65 FE) MG tablet TAKE 1 TABLET BY MOUTH TWICE DAILY WITH MEALS TO KEEP BLOOD COUNT UP Patient taking differently: Take 325 mg by mouth daily. 08/26/16   Tillman Sers, DO  guaiFENesin (MUCINEX) 600 MG 12 hr tablet Take 600 mg by mouth every 12 (twelve) hours as needed (congestion.).    [provider]  ipratropium-albuterol (DUONEB) 0.5-2.5 (3) MG/3ML SOLN USE 3 ML VIA NEBULIZER EVERY 6 HOURS Patient taking differently: Take 3 mLs by nebulization every 6 (six) hours as needed (shortness of breath/wheezing.). 10/21/18   Tillman Sers, DO  Menthol, Topical Analgesic, (BIOFREEZE EX) Apply 1 Application topically 2 (two) times daily as needed (aches/pain.).    [provider]  metoprolol succinate (TOPROL XL) 25 MG 24 hr tablet Take 0.5 tablets (12.5 mg total) by mouth daily. 08/11/22   Laurey Morale, MD  mirtazapine (REMERON) 7.5 MG tablet Take 7.5 mg by mouth at bedtime.    [provider]  nitroGLYCERIN (NITROSTAT) 0.4 MG SL tablet Place 1 tablet (0.4 mg total) under the tongue every 5 (five) minutes as needed for chest pain. 08/04/18   Tillman Sers, DO  Nutritional Supplements (ENSURE PLUS PO) Take 237 mLs by mouth in the  morning, at noon, and at bedtime.    [provider]  potassium chloride SA (KLOR-CON M) 20 MEQ tablet Take 20 mEq by mouth daily.    [provider]  predniSONE (DELTASONE) 5 MG tablet Take 5 mg by mouth daily.    [provider]  rosuvastatin (CRESTOR) 40 MG tablet Take 1 tablet (40 mg total) by mouth at bedtime. 05/29/23   Alver Sorrow, NP  STIMULANT LAXATIVE 8.6-50 MG tablet Take 2 tablets by mouth at bedtime. 11/22/19   [provider]  torsemide (DEMADEX) 20 MG tablet Take 2 tablets (40 mg total) by mouth daily. 05/29/23   Alver Sorrow, NP  traMADol (ULTRAM) 50 MG tablet Take 1 tablet (50  mg total) by mouth every 6 (six) hours as needed for moderate pain. 06/02/23 06/01/24  Graceann Congress, PA-C    Current Facility-Administered Medications  Medication Dose Route Frequency Provider Last Rate Last Admin  . 0.9 %  sodium chloride infusion (Manually program via Guardrails IV Fluids)   Intravenous Once Rondel Baton, MD      . 0.9 %  sodium chloride infusion (Manually program via Guardrails IV Fluids)   Intravenous Once Champ Mungo, DO      . acetaminophen (TYLENOL) tablet 650 mg  650 mg Oral Q6H PRN Atway, [name], DO       Or  . acetaminophen (TYLENOL) suppository 650 mg  650 mg Rectal Q6H PRN Atway, [name], DO      . Chlorhexidine Gluconate Cloth 2 % PADS 6 each  6 each Topical Q0600 Bufford Buttner, MD      . donepezil (ARICEPT) tablet 10 mg  10 mg Oral QHS Atway, [name], DO   10 mg at 07/08/23 0117  . metoprolol succinate (TOPROL-XL) 24 hr tablet 12.5 mg  12.5 mg Oral Daily Atway, [name], DO      . mirtazapine (REMERON) tablet 7.5 mg  7.5 mg Oral QHS Atway, [name], DO   7.5 mg at 07/08/23 0117  . pantoprazole (PROTONIX) 80 mg /NS 100 mL IVPB  80 mg Intravenous Once Rondel Baton, MD      . pantoprozole (PROTONIX) 80 mg /NS 100 mL infusion  8 mg/hr Intravenous Continuous Rondel Baton, MD 10 mL/hr at 07/08/23 0237 8 mg/hr at  07/08/23 0237  . rosuvastatin (CRESTOR) tablet 40 mg  40 mg Oral QHS Atway, [name], DO   40 mg at 07/08/23 0116  . senna-docusate (Senokot-S) tablet 1 tablet  1 tablet Oral QHS PRN Atway, [name], DO      . sodium zirconium cyclosilicate (LOKELMA) packet 10 g  10 g Oral STAT Rondel Baton, MD      . sodium zirconium cyclosilicate (LOKELMA) packet 10 g  10 g Oral TID Bufford Buttner, MD   10 g at 07/08/23 0330    Allergies as of 06/18/2023 - Review Complete 06/21/2023  Allergen Reaction Noted  . Ace inhibitors Swelling 03/15/2008  . Other Anaphylaxis and Swelling 10/15/2016  . Propofol  07/15/2016    Review of Systems  Unable to perform ROS: Acuity of condition       Physical Exam:  Vital signs in last 24 hours: Temp:  [97.1 F (36.2 C)-97.9 F (36.6 C)] 97.6 F (36.4 C) (07/24 0522) Pulse Rate:  [7-71] 70 (07/24 0522) Resp:  [13-23] 18 (07/24 0522) BP: (111-141)/(48-85) 141/68 (07/24 0522) SpO2:  [90 %-100 %] 98 % (07/24 0522) FiO2 (%):  [21 %-28 %] 28 % (07/24 0721) Last BM Date : 07/08/23 Last BM recorded by nurses in past 5 days Stool Type: Type 6 (Mushy consistency with ragged edges) (07/08/2023 12:33 AM)  Physical Exam Constitutional:      Appearance: Normal appearance.     Comments: Disoriented.  When asked where she is she states "24" and when asked what year she states 2026  HENT:     Head: Normocephalic and atraumatic.     Nose: Nose normal. No congestion.  Eyes:     Extraocular Movements: Extraocular movements intact.     Conjunctiva/sclera: Conjunctivae normal.  Cardiovascular:     Rate and Rhythm: Normal rate and regular rhythm.  Pulmonary:     Effort: Pulmonary effort  is normal.     Breath sounds: Normal breath sounds.  Abdominal:     General: Abdomen is flat. Bowel sounds are normal. There is no distension.     Palpations: Abdomen is soft. There is no mass.     Tenderness: There is no abdominal tenderness. There is no guarding or rebound.      Hernia: No hernia is present.  Musculoskeletal:        General: No swelling. Normal range of motion.     Cervical back: Normal range of motion and neck supple.  Skin:    General: Skin is warm and dry.  Neurological:     Mental Status: She is disoriented.  Psychiatric:        Mood and Affect: Mood normal.        Behavior: Behavior normal.      LAB RESULTS: Recent Labs    06/17/2023 1545 06/21/2023 2031  WBC 13.8* 13.1*  HGB 5.8* 4.7*  HCT 22.9* 17.4*  PLT 304 285   BMET Recent Labs    07/01/2023 1545 07/14/2023 2031  NA 135 135  K 7.1* 6.3*  CL 105 105  CO2 10* 11*  GLUCOSE 150* 280*  BUN 119* 120*  CREATININE 7.81* 7.74*  CALCIUM 8.7* 8.5*   LFT Recent Labs    07/01/2023 1545 06/29/2023 2031  PROT 6.2*  --   ALBUMIN 2.9* 2.5*  AST 167*  --   ALT 39  --   ALKPHOS 48  --   BILITOT 0.5  --    PT/INR No results for input(s): "LABPROT", "INR" in the last 72 hours.  STUDIES: DG Chest Port 1 View  Result Date: 07/04/2023 CLINICAL DATA:  Failure to thrive EXAM: PORTABLE CHEST 1 VIEW COMPARISON:  Previous studies including the examination of 04/23/2020 FINDINGS: Transverse diameter of heart is increased. There are no signs of alveolar pulmonary edema or focal pulmonary consolidation. Tip of tracheostomy is 7.7 cm above the carina. Pacemaker battery is seen in right infraclavicular region. There is no pleural effusion or pneumothorax. IMPRESSION: Cardiomegaly. There are no signs of pulmonary edema or focal pulmonary consolidation. Electronically Signed   By: Ernie Avena M.D.   On: 07/10/2023 15:27      Impression    77 year old female history of laryngeal stenosis s/p tracheostomy and ESRD s/p fistula just started dialysis overnight with history of chronic GI blood loss second to AVMs with most recent small bowel enteroscopy in 2022 showing 7 angioectasias throughout stomach, duodenum, jejunum treated with APC presenting with acute on chronic symptomatic anemia and  failure to thrive Hgb 4.7 (10.5 06/02/2023) received 3 units PRBCs yesterday with improved to Hgb 9.9 MCV 89.7 Iron 13, TIBC 353, saturation 4% Fecal occult positive Discussed with daughter doing small bowel enteroscopy for therapeutic and diagnostic treatment.  Daughter provided verbal consent to proceed  A-fib on Eliquis -Last Eliquis dose 7/23 AM  ESRD s/p fistula received 2 hours of dialysis overnight -Improvement in potassium from 6.3-4.1 -BUN 120, creatinine 7.74, GFR 5 initially, now improved with BUN 59, creatinine 4.94, GFR 8  Failure to thrive -Palliative care following   Plan   Plan for small bowel enteroscopy tomorrow. I thoroughly discussed the procedures to include nature, alternatives, benefits, and risks including but not limited to bleeding, perforation, infection, anesthesia/cardiac and pulmonary complications. Patient's daughter provides understanding and gave verbal consent to proceed. Continue Protonix IV 40mg  BID Can have diet today with NPO midnight Continue daily CBC and transfuse as needed to  maintain HGB > 7  Continue to hold Eliquis  Thank you for your kind consultation, we will continue to follow.   Vandana Haman Leanna Sato  07/08/2023, 7:56 AM

## 2023-07-08 NOTE — H&P (View-Only) (Signed)
Consultation  Referring Provider:  [facility]  Primary Care Physician:  [facility], [facility] Primary Gastroenterologist:  Dr. Lavon Paganini     Reason for Consultation:     Acute on chronic anemia  LOS: 1 day          HPI:   [name] is a 77 y.o. female with past medical history significant for A-fib (on Eliquis), ESRD s/p fistula (just started dialysis), COPD, CVA, chronic congestive heart failure, laryngeal stenosis s/p tracheostomy, chronic GI blood loss secondary to AVMs presents for evaluation of symptomatic anemia and failure to thrive.  History obtained through chart review as patient is disoriented.  History of laryngeal stenosis secondary to tracheostomy that was done after respiratory failure following propofol administration during Watchman procedure  History of small bowel AVMs and has undergone multiple procedures with treatment (see below).  Most recent procedure was a small bowel endoscopy 09/2021 which found 7 angiodysplastic lesions throughout stomach, duodenum, jejunum that were treated with APC  Patient was at dialysis yesterday (7/23) discussing getting set up for her first session and felt ill so she was recommended to come to the ED.  Last dose of Eliquis 7/23 AM  Pertinent lab values Hgb 4.7 (10.5 06/02/2023) received 3 units PRBCs yesterday with improved to Hgb 9.9 MCV 89.7 Potassium initially 6.3, underwent 2 hours of dialysis last night with improvement to 4.1 BUN 120, creatinine 7.74, GFR 5 initially, now improved with BUN 59, creatinine 4.94, GFR 8 Iron 13, TIBC 353, saturation 4% Fecal occult positive  Was able to get in contact with daughter and discussed little further.  Daughter states that patient typically is able to take care of herself and do activities of daily living.  She lives by herself.  She also is typically oriented.  Daughter states over the last 3 days patient has not been able to take care of herself, has been  needing help with taking medicines and using the bathroom.  Notes 2 to 3 days of melena and daughter states patient has been significantly more confused than usual.  [facility] follows with [facility] a few days per week   PREVIOUS GI WORKUP---------------------------------------------------  Small bowel endoscopy 09/2021 for IDA and small bowel AVMs - Z- line regular.  - Normal esophagus otherwise  - A single non- bleeding angiodysplastic lesion in the stomach. Treated with argon plasma coagulation ( APC) .  - A single non- bleeding angiodysplastic lesion in the stomach. Treated with argon plasma coagulation ( APC) .  - Normal stomach otherwise  - Four non- bleeding angiodysplastic lesions in the duodenum. Treated with argon plasma coagulation ( APC) .  - A single non- bleeding angiodysplastic lesion in the jejunum. Treated with argon plasma coagulation ( APC) .  - An area in the distal most aspected of the jejunum was tattooed ( see above, after initial tattoo placed,  EGD 09/04/2020: - Z-line regular, 35 cm from the incisors. - Erosive gastropathy with no bleeding and no stigmata of recent bleeding. - Two angiodysplastic lesions typical arborization in the duodenum. Treated with argon plasma coagulation (APC). - The examination was otherwise normal. - No specimens collected.   Colonoscopy 09/04/2020: - Five 3 to 5 mm polyps in the transverse colon, removed with a cold snare. Resected and retrieved. - Three 1 to 2 mm polyps in the sigmoid colon and in the descending colon, removed with a cold biopsy forceps. Resected and retrieved. - Melanosis in the colon. -  Non-bleeding internal hemorrhoids. - The examination was otherwise normal. FINAL MICROSCOPIC DIAGNOSIS:   A. COLON, TRANSVERSE, POLYPECTOMY:  - Tubular adenoma (s).  - No high-grade dysplasia or carcinoma.   B. COLON, DESCENDING, SIGMOID, POLYPECTOMY:  - Tubular adenoma (s).  - No high-grade dysplasia or carcinoma.   EGD and  colonoscopy July 2018.  EGD was unremarkable and colonoscopy with finding of 7 polyps, the largest was 15 mm all removed.  Path consistent with tubular adenomas and follow-up in 3 years was recommended.   Small bowel capsule endoscopy in 2013: Negative but with fair to poor prep    Past Medical History:  Diagnosis Date  . Anemia   . Angioedema    2/2 ACE  . Arteriovenous malformation of stomach   . Arthritis   . Asthma   . AVM (arteriovenous malformation) of colon    small intestine; stomach  . Blood transfusion   . CHF (congestive heart failure) (HCC)   . CKD (chronic kidney disease) stage 4, GFR 15-29 ml/min (HCC) 09/24/2010   Baseline Cr 1.3-1.6  . Complete heart block (HCC)    s/p PPM 1998  . Complication of anesthesia    Difficult airway; anaphylaxis and swelling with propofol  . COPD (chronic obstructive pulmonary disease) (HCC)   . DDD (degenerative disc disease)   . Dementia (HCC)   . Depression   . Diabetes mellitus, type 2 (HCC)    pt's daughter states pt has never had diabetes  . Diastolic heart failure   . Fatty liver 07/26/2010  . GERD (gastroesophageal reflux disease)   . GI bleed   . History of alcohol abuse Stopped Fall 2012  . History of tobacco use Quit Fall 2012  . Hx of cardiovascular stress test    a. Lexiscan Myoview (10/15):  Small inferolateral and apical defect c/w scar and poss soft tissue attenuation, no ischemia, EF 42%  . Hx of colonic polyp 08/13/2010   adenomatous  . Hx of colonoscopy   . Hyperlipidemia   . Hypertension   . Hypertrophic cardiomyopathy (HCC)    dx by Dr Juanda Chance 2009  . Iron deficiency anemia   . Myocardial infarction (HCC)   . Pacemaker   . Panic attacks   . Permanent atrial fibrillation (HCC)   . Pneumonia   . Renal failure    baseline creatinine 1.6  . Right arm pain 01/08/2012  . RLS (restless legs syndrome)    Dx 06/2007  . Sleep apnea    no cpap   . Stroke [facility])     Surgical History:  She  has a past  surgical history that includes Tubal ligation (04/01/2000); Pacemaker insertion (1998); Cardiac catheterization; Esophagogastroduodenoscopy (12/23/2011); Givens capsule study (12/23/2011); Extubation (endotracheal) in or (N/A, 07/21/2016); Cardiac catheterization (N/A, 08/19/2016); Direct laryngoscopy (N/A, 09/18/2016); Microlaryngoscopy with laser (N/A, 10/16/2016); Direct laryngoscopy (N/A, 10/20/2016); Tracheostomy tube placement (N/A, 10/29/2016); Direct laryngoscopy (N/A, 10/29/2016); Colonoscopy with propofol (N/A, 06/16/2017); Esophagogastroduodenoscopy (egd) with propofol (N/A, 06/16/2017); Colonoscopy with propofol (N/A, 09/04/2020); Esophagogastroduodenoscopy (egd) with propofol (N/A, 09/04/2020); Hot hemostasis (N/A, 09/04/2020); polypectomy (09/04/2020); biopsy (09/04/2020); enteroscopy (N/A, 09/30/2021); Hot hemostasis (N/A, 09/30/2021); Submucosal tattoo injection (09/30/2021); AV fistula placement (Left, 09/09/2022); and AV fistula placement (Left, 06/02/2023). Family History:  Her family history includes Alcohol abuse in her brother and father; Aneurysm in her mother; CVA in her mother; Cancer in her brother; Colon cancer in her brother; Diabetes in her brother and sister; Heart attack in her brother, brother, and father; Heart disease in her daughter,  mother, and sister; Hypertension in her brother, father, mother, and sister; Stroke in her father and mother. Social History:   reports that she quit smoking about 11 years ago. Her smoking use included cigarettes. She started smoking about 61 years ago. She has a 5 pack-year smoking history. She has never used smokeless tobacco. She reports that she does not drink alcohol and does not use drugs.  Prior to Admission medications   Medication Sig Start Date End Date Taking? Authorizing Provider  acetaminophen (TYLENOL) 650 MG CR tablet Take 650 mg by mouth every 8 (eight) hours as needed for pain.     [provider]  albuterol (PROVENTIL HFA;VENTOLIN HFA)  108 (90 Base) MCG/ACT inhaler Inhale 1-2 puffs into the lungs every 6 (six) hours as needed for wheezing or shortness of breath. 08/04/18   Riccio, Marcell Anger, DO  apixaban (ELIQUIS) 2.5 MG TABS tablet Take 1 tablet (2.5 mg total) by mouth 2 (two) times daily. 09/03/21   [facility], Roe Rutherford, PA  bimatoprost (LUMIGAN) 0.01 % SOLN Place 1 drop into both eyes at bedtime.    [provider]  brimonidine (ALPHAGAN) 0.2 % ophthalmic solution Place 1 drop into the right eye in the morning and at bedtime.    [provider]  donepezil (ARICEPT) 10 MG tablet Take 10 mg by mouth at bedtime.    [provider]  Epoetin Alfa-epbx (RETACRIT IJ) Inject 1 Dose as directed once a week.     [provider]  ferrous sulfate 325 (65 FE) MG tablet TAKE 1 TABLET BY MOUTH TWICE DAILY WITH MEALS TO KEEP BLOOD COUNT UP Patient taking differently: Take 325 mg by mouth daily. 08/26/16   Tillman Sers, DO  guaiFENesin (MUCINEX) 600 MG 12 hr tablet Take 600 mg by mouth every 12 (twelve) hours as needed (congestion.).    [provider]  ipratropium-albuterol (DUONEB) 0.5-2.5 (3) MG/3ML SOLN USE 3 ML VIA NEBULIZER EVERY 6 HOURS Patient taking differently: Take 3 mLs by nebulization every 6 (six) hours as needed (shortness of breath/wheezing.). 10/21/18   Tillman Sers, DO  Menthol, Topical Analgesic, (BIOFREEZE EX) Apply 1 Application topically 2 (two) times daily as needed (aches/pain.).    [provider]  metoprolol succinate (TOPROL XL) 25 MG 24 hr tablet Take 0.5 tablets (12.5 mg total) by mouth daily. 08/11/22   Laurey Morale, MD  mirtazapine (REMERON) 7.5 MG tablet Take 7.5 mg by mouth at bedtime.    [provider]  nitroGLYCERIN (NITROSTAT) 0.4 MG SL tablet Place 1 tablet (0.4 mg total) under the tongue every 5 (five) minutes as needed for chest pain. 08/04/18   Tillman Sers, DO  Nutritional Supplements (ENSURE PLUS PO) Take 237 mLs by mouth in the  morning, at noon, and at bedtime.    [provider]  potassium chloride SA (KLOR-CON M) 20 MEQ tablet Take 20 mEq by mouth daily.    [provider]  predniSONE (DELTASONE) 5 MG tablet Take 5 mg by mouth daily.    [provider]  rosuvastatin (CRESTOR) 40 MG tablet Take 1 tablet (40 mg total) by mouth at bedtime. 05/29/23   Alver Sorrow, NP  STIMULANT LAXATIVE 8.6-50 MG tablet Take 2 tablets by mouth at bedtime. 11/22/19   [provider]  torsemide (DEMADEX) 20 MG tablet Take 2 tablets (40 mg total) by mouth daily. 05/29/23   Alver Sorrow, NP  traMADol (ULTRAM) 50 MG tablet Take 1 tablet (50  mg total) by mouth every 6 (six) hours as needed for moderate pain. 06/02/23 06/01/24  Graceann Congress, PA-C    Current Facility-Administered Medications  Medication Dose Route Frequency Provider Last Rate Last Admin  . 0.9 %  sodium chloride infusion (Manually program via Guardrails IV Fluids)   Intravenous Once Rondel Baton, MD      . 0.9 %  sodium chloride infusion (Manually program via Guardrails IV Fluids)   Intravenous Once Champ Mungo, DO      . acetaminophen (TYLENOL) tablet 650 mg  650 mg Oral Q6H PRN Atway, [name], DO       Or  . acetaminophen (TYLENOL) suppository 650 mg  650 mg Rectal Q6H PRN Atway, [name], DO      . Chlorhexidine Gluconate Cloth 2 % PADS 6 each  6 each Topical Q0600 Bufford Buttner, MD      . donepezil (ARICEPT) tablet 10 mg  10 mg Oral QHS Atway, [name], DO   10 mg at 07/08/23 0117  . metoprolol succinate (TOPROL-XL) 24 hr tablet 12.5 mg  12.5 mg Oral Daily Atway, [name], DO      . mirtazapine (REMERON) tablet 7.5 mg  7.5 mg Oral QHS Atway, [name], DO   7.5 mg at 07/08/23 0117  . pantoprazole (PROTONIX) 80 mg /NS 100 mL IVPB  80 mg Intravenous Once Rondel Baton, MD      . pantoprozole (PROTONIX) 80 mg /NS 100 mL infusion  8 mg/hr Intravenous Continuous Rondel Baton, MD 10 mL/hr at 07/08/23 0237 8 mg/hr at  07/08/23 0237  . rosuvastatin (CRESTOR) tablet 40 mg  40 mg Oral QHS Atway, [name], DO   40 mg at 07/08/23 0116  . senna-docusate (Senokot-S) tablet 1 tablet  1 tablet Oral QHS PRN Atway, [name], DO      . sodium zirconium cyclosilicate (LOKELMA) packet 10 g  10 g Oral STAT Rondel Baton, MD      . sodium zirconium cyclosilicate (LOKELMA) packet 10 g  10 g Oral TID Bufford Buttner, MD   10 g at 07/08/23 0330    Allergies as of 07/07/2023 - Review Complete 07/07/2023  Allergen Reaction Noted  . Ace inhibitors Swelling 03/15/2008  . Other Anaphylaxis and Swelling 10/15/2016  . Propofol  07/15/2016    Review of Systems  Unable to perform ROS: Acuity of condition       Physical Exam:  Vital signs in last 24 hours: Temp:  [97.1 F (36.2 C)-97.9 F (36.6 C)] 97.6 F (36.4 C) (07/24 0522) Pulse Rate:  [7-71] 70 (07/24 0522) Resp:  [13-23] 18 (07/24 0522) BP: (111-141)/(48-85) 141/68 (07/24 0522) SpO2:  [90 %-100 %] 98 % (07/24 0522) FiO2 (%):  [21 %-28 %] 28 % (07/24 0721) Last BM Date : 07/08/23 Last BM recorded by nurses in past 5 days Stool Type: Type 6 (Mushy consistency with ragged edges) (07/08/2023 12:33 AM)  Physical Exam Constitutional:      Appearance: Normal appearance.     Comments: Disoriented.  When asked where she is she states "24" and when asked what year she states 2026  HENT:     Head: Normocephalic and atraumatic.     Nose: Nose normal. No congestion.  Eyes:     Extraocular Movements: Extraocular movements intact.     Conjunctiva/sclera: Conjunctivae normal.  Cardiovascular:     Rate and Rhythm: Normal rate and regular rhythm.  Pulmonary:     Effort: Pulmonary effort  is normal.     Breath sounds: Normal breath sounds.  Abdominal:     General: Abdomen is flat. Bowel sounds are normal. There is no distension.     Palpations: Abdomen is soft. There is no mass.     Tenderness: There is no abdominal tenderness. There is no guarding or rebound.      Hernia: No hernia is present.  Musculoskeletal:        General: No swelling. Normal range of motion.     Cervical back: Normal range of motion and neck supple.  Skin:    General: Skin is warm and dry.  Neurological:     Mental Status: She is disoriented.  Psychiatric:        Mood and Affect: Mood normal.        Behavior: Behavior normal.      LAB RESULTS: Recent Labs    07/07/23 1545 07/07/23 2031  WBC 13.8* 13.1*  HGB 5.8* 4.7*  HCT 22.9* 17.4*  PLT 304 285   BMET Recent Labs    07/07/23 1545 07/07/23 2031  NA 135 135  K 7.1* 6.3*  CL 105 105  CO2 10* 11*  GLUCOSE 150* 280*  BUN 119* 120*  CREATININE 7.81* 7.74*  CALCIUM 8.7* 8.5*   LFT Recent Labs    07/07/23 1545 07/07/23 2031  PROT 6.2*  --   ALBUMIN 2.9* 2.5*  AST 167*  --   ALT 39  --   ALKPHOS 48  --   BILITOT 0.5  --    PT/INR No results for input(s): "LABPROT", "INR" in the last 72 hours.  STUDIES: DG Chest Port 1 View  Result Date: 07/07/2023 CLINICAL DATA:  Failure to thrive EXAM: PORTABLE CHEST 1 VIEW COMPARISON:  Previous studies including the examination of 04/23/2020 FINDINGS: Transverse diameter of heart is increased. There are no signs of alveolar pulmonary edema or focal pulmonary consolidation. Tip of tracheostomy is 7.7 cm above the carina. Pacemaker battery is seen in right infraclavicular region. There is no pleural effusion or pneumothorax. IMPRESSION: Cardiomegaly. There are no signs of pulmonary edema or focal pulmonary consolidation. Electronically Signed   By: Ernie Avena M.D.   On: 07/07/2023 15:27      Impression    77 year old female history of laryngeal stenosis s/p tracheostomy and ESRD s/p fistula just started dialysis overnight with history of chronic GI blood loss second to AVMs with most recent small bowel enteroscopy in 2022 showing 7 angioectasias throughout stomach, duodenum, jejunum treated with APC presenting with acute on chronic symptomatic anemia and  failure to thrive Hgb 4.7 (10.5 06/02/2023) received 3 units PRBCs yesterday with improved to Hgb 9.9 MCV 89.7 Iron 13, TIBC 353, saturation 4% Fecal occult positive Discussed with daughter doing small bowel enteroscopy for therapeutic and diagnostic treatment.  Daughter provided verbal consent to proceed  A-fib on Eliquis -Last Eliquis dose 7/23 AM  ESRD s/p fistula received 2 hours of dialysis overnight -Improvement in potassium from 6.3-4.1 -BUN 120, creatinine 7.74, GFR 5 initially, now improved with BUN 59, creatinine 4.94, GFR 8  Failure to thrive -Palliative care following   Plan   Plan for small bowel enteroscopy tomorrow. I thoroughly discussed the procedures to include nature, alternatives, benefits, and risks including but not limited to bleeding, perforation, infection, anesthesia/cardiac and pulmonary complications. Patient's daughter provides understanding and gave verbal consent to proceed. Continue Protonix IV 40mg  BID Can have diet today with NPO midnight Continue daily CBC and transfuse as needed to  maintain HGB > 7  Continue to hold Eliquis  Thank you for your kind consultation, we will continue to follow.   Vandana Haman Leanna Sato  07/08/2023, 7:56 AM

## 2023-07-08 NOTE — TOC Initial Note (Signed)
Transition of Care [facility]) - Initial/Assessment Note    Patient Details  Name: [name] MRN: [id number] Date of Birth: 31-Dec-1945  [facility]) CM/SW Contact:    Tom-Johnson, Hershal Coria, RN Phone Number: 07/08/2023, 3:28 PM  Clinical Narrative:                  CM spoke with patient and daughter, Christella Scheuermann at bedside about needs for post hospital transition.  Presented to the ED with Failure to thrive and Decreased PO intake, found to have Acute on Chronic Anemia and GI bleed. Hgb on admit was 5.8 repeat was 4.7. 3U PRBC given, hgb at this time at 9.9.  Potassium was 7.1, on admit, today at 4.1. GI following.  Patient also treated for AKI on CKD V to ESRD. Started on HD, Nephrology following. Renal Navigator to [facility] for outpatient dialysis.  Patient with chronic Trach and Pacemaker. Patient is with [facility] and provide Trach supplies.   Patient is from home with daughter, Christella Scheuermann whom is her primary caregiver. Has four supportive children. [name] transports patient to and from appointments. PCP and Pharmacy with [facility]. [facility] Th[facility].  Has a cane and rollator at home.  Awaits PT/OT eval for disposition.   CM will continue to follow as patient progresses with care towards discharge.          Patient Goals and CMS Choice            Expected Discharge Plan and Services                                              Prior Living Arrangements/Services                       Activities of Daily Living      Permission Sought/Granted                  Emotional Assessment              Admission diagnosis:  Hyperkalemia [E87.5] Patient Active Problem List   Diagnosis Date Noted   Melena 07/08/2023   ESRD on dialysis (HCC) 07/08/2023   Acute encephalopathy 07/08/2023   Acute on chronic anemia 07/07/2023   AKI (acute kidney injury) (HCC) 04/23/2020   Cough with hemoptysis 08/02/2019   SOB  (shortness of breath)    Atypical chest pain    Chest pain on exertion 05/15/2019   Cerebral embolism with cerebral infarction 05/09/2019   Expressive aphasia 05/08/2019   Other abnormal glucose 11/26/2018   Polypharmacy 06/25/2018   Depression with anxiety    Tracheostomy in place [facility])    Tracheostomy care (HCC)    Calculus of gallbladder without cholecystitis without obstruction 12/24/2017   Chronic cough 09/25/2017   History of colonic polyps    Polyp of transverse colon    Polyp of sigmoid colon    Moderate protein-calorie malnutrition (HCC) 06/02/2017   Chronic idiopathic constipation 03/19/2017   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   Permanent atrial fibrillation (HCC)    Laryngeal stenosis 09/18/2016   Esophageal dysphagia    Polymyalgia rheumatica (HCC) 04/10/2015   Hypertrophic cardiomyopathy (HCC) 02/15/2015   Eczema 09/18/2014   Environmental allergies 09/18/2014   Pre-diabetes 11/26/2013   COPD, moderate (HCC) 11/01/2013   Complete heart block (HCC)  04/03/2011   Acute on chronic diastolic heart failure (HCC) 02/20/2011   Anemia 02/17/2011   AVM (arteriovenous malformation) of small bowel, acquired 02/13/2011   PACEMAKER-St.Jude 11/28/2010   CKD (chronic kidney disease) stage 4, GFR 15-29 ml/min (HCC) 09/24/2010   Acute on chronic congestive heart failure (HCC) 08/30/2010   ARTHRITIS 07/24/2010   Generalized anxiety disorder 07/23/2010   Hyperlipidemia 02/11/2007   Essential hypertension 02/11/2007   APNEA, SLEEP 02/11/2007   PCP:  [facility], [facility] Pharmacy:   [facility] - [place], [place] - [address] [place] [address] Phone: [phone] Fax: [phone]     Social Determinants of Health (SDOH) Social History: SDOH Screenings   Depression (PHQ2-9): Low Risk  (05/20/2019)  Tobacco Use: Medium Risk (06/25/2023)   SDOH Interventions: Transportation Interventions: Intervention  Not Indicated, Inpatient TOC, Patient Resources (Friends/Family)   Readmission Risk Interventions    07/08/2023    3:26 PM  Readmission Risk Prevention Plan  Transportation Screening Complete  PCP or Specialist Appt within 3-5 Days Complete  HRI or Home Care Consult Complete  Social Work Consult for Recovery Care Planning/Counseling Complete  Palliative Care Screening Not Applicable  Medication Review Oceanographer) Referral to Pharmacy

## 2023-07-08 NOTE — Progress Notes (Signed)
   New Admission Note:   Arrival Method: By bed from ED around 1600 Mental Orientation: Alert and oriented x 4 Telemetry: Box 4, CCMD notified Assessment: Completed Skin: Completed, refer to flowsheets IV: RFA x 2 Pain: Denies Tubes: None Admission: Completed [facility] Orientation: Patient has been orientated to the room, unit and the staff. Family: Husband at bedside    Orders have been reviewed and implemented. Will continue to monitor the patient. Call light has been placed within reach and bed alarm has been activated.

## 2023-07-08 NOTE — Plan of Care (Signed)
  Problem: Health Behavior/Discharge Planning: Goal: Ability to manage health-related needs will improve Outcome: Progressing   Problem: Clinical Measurements: Goal: Ability to maintain clinical measurements within normal limits will improve Outcome: Progressing Goal: Will remain free from infection Outcome: Progressing Goal: Cardiovascular complication will be avoided Outcome: Progressing   Problem: Activity: Goal: Risk for activity intolerance will decrease Outcome: Progressing   Problem: Nutrition: Goal: Adequate nutrition will be maintained Outcome: Progressing   Problem: Elimination: Goal: Will not experience complications related to bowel motility Outcome: Progressing Goal: Will not experience complications related to urinary retention Outcome: Progressing

## 2023-07-08 NOTE — TOC Progression Note (Signed)
Transition of Care [facility]) - Initial/Assessment Note    Patient Details  Name: [name] MRN: [id number] Date of Birth: Jun 23, 1946  Transition of Care [facility]) CM/SW Contact:    Ralene Bathe, LCSW Phone Number: 07/08/2023, 2:49 PM  Clinical Narrative:                 LCSW received a call from patient's [facility] social worker Marylene Land, [phone], requesting an update.  Update provided.  TOC following.          Patient Goals and CMS Choice            Expected Discharge Plan and Services                                              Prior Living Arrangements/Services                       Activities of Daily Living      Permission Sought/Granted                  Emotional Assessment              Admission diagnosis:  Hyperkalemia [E87.5] Patient Active Problem List   Diagnosis Date Noted   Melena 07/08/2023   ESRD on dialysis (HCC) 07/08/2023   Acute encephalopathy 07/08/2023   Acute on chronic anemia 07/07/2023   AKI (acute kidney injury) (HCC) 04/23/2020   Cough with hemoptysis 08/02/2019   SOB (shortness of breath)    Atypical chest pain    Chest pain on exertion 05/15/2019   Cerebral embolism with cerebral infarction 05/09/2019   Expressive aphasia 05/08/2019   Other abnormal glucose 11/26/2018   Polypharmacy 06/25/2018   Depression with anxiety    Tracheostomy in place [facility])    Tracheostomy care (HCC)    Calculus of gallbladder without cholecystitis without obstruction 12/24/2017   Chronic cough 09/25/2017   History of colonic polyps    Polyp of transverse colon    Polyp of sigmoid colon    Moderate protein-calorie malnutrition (HCC) 06/02/2017   Chronic idiopathic constipation 03/19/2017   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   Permanent atrial fibrillation (HCC)    Laryngeal stenosis 09/18/2016   Esophageal dysphagia    Polymyalgia rheumatica (HCC) 04/10/2015   Hypertrophic cardiomyopathy (HCC) 02/15/2015   Eczema  09/18/2014   Environmental allergies 09/18/2014   Pre-diabetes 11/26/2013   COPD, moderate (HCC) 11/01/2013   Complete heart block (HCC) 04/03/2011   Acute on chronic diastolic heart failure (HCC) 02/20/2011   Anemia 02/17/2011   AVM (arteriovenous malformation) of small bowel, acquired 02/13/2011   PACEMAKER-St.Jude 11/28/2010   CKD (chronic kidney disease) stage 4, GFR 15-29 ml/min (HCC) 09/24/2010   Acute on chronic congestive heart failure (HCC) 08/30/2010   ARTHRITIS 07/24/2010   Generalized anxiety disorder 07/23/2010   Hyperlipidemia 02/11/2007   Essential hypertension 02/11/2007   APNEA, SLEEP 02/11/2007   PCP:  [facility]:   [facility] - [place], [place] - [address] [place] [address] Phone: [phone] Fax: [phone]     Social Determinants of Health (SDOH) Social History: SDOH Screenings   Depression (PHQ2-9): Low Risk  (05/20/2019)  Tobacco Use: Medium Risk (06/25/2023)   SDOH Interventions:     Readmission Risk Interventions     No data  to display

## 2023-07-09 ENCOUNTER — Encounter (HOSPITAL_COMMUNITY): Payer: Self-pay | Admitting: Internal Medicine

## 2023-07-09 ENCOUNTER — Encounter (HOSPITAL_COMMUNITY): Admission: EM | Disposition: E | Payer: Self-pay | Source: Home / Self Care | Attending: Internal Medicine

## 2023-07-09 ENCOUNTER — Inpatient Hospital Stay (HOSPITAL_COMMUNITY): Payer: Medicare (Managed Care) | Admitting: Certified Registered Nurse Anesthetist

## 2023-07-09 DIAGNOSIS — D649 Anemia, unspecified: Secondary | ICD-10-CM

## 2023-07-09 DIAGNOSIS — N186 End stage renal disease: Secondary | ICD-10-CM | POA: Diagnosis not present

## 2023-07-09 DIAGNOSIS — Z992 Dependence on renal dialysis: Secondary | ICD-10-CM | POA: Diagnosis not present

## 2023-07-09 DIAGNOSIS — Q2733 Arteriovenous malformation of digestive system vessel: Secondary | ICD-10-CM | POA: Diagnosis not present

## 2023-07-09 DIAGNOSIS — K31811 Angiodysplasia of stomach and duodenum with bleeding: Secondary | ICD-10-CM | POA: Diagnosis present

## 2023-07-09 DIAGNOSIS — Z930 Tracheostomy status: Secondary | ICD-10-CM | POA: Diagnosis not present

## 2023-07-09 DIAGNOSIS — R627 Adult failure to thrive: Secondary | ICD-10-CM | POA: Diagnosis not present

## 2023-07-09 DIAGNOSIS — K921 Melena: Secondary | ICD-10-CM | POA: Diagnosis not present

## 2023-07-09 HISTORY — PX: HEMOSTASIS CONTROL: SHX6838

## 2023-07-09 HISTORY — PX: ESOPHAGOGASTRODUODENOSCOPY: SHX5428

## 2023-07-09 HISTORY — PX: HOT HEMOSTASIS: SHX5433

## 2023-07-09 LAB — TYPE AND SCREEN
ABO/RH(D): O POS
Unit division: 0
Unit division: 0

## 2023-07-09 LAB — CBC WITH DIFFERENTIAL/PLATELET
Abs Immature Granulocytes: 0.06 10*3/uL (ref 0.00–0.07)
Basophils Absolute: 0 10*3/uL (ref 0.0–0.1)
Basophils Relative: 0 %
Eosinophils Absolute: 0 10*3/uL (ref 0.0–0.5)
Eosinophils Relative: 0 %
HCT: 33.5 % — ABNORMAL LOW (ref 36.0–46.0)
Hemoglobin: 10.8 g/dL — ABNORMAL LOW (ref 12.0–15.0)
Immature Granulocytes: 1 %
Lymphocytes Relative: 3 %
Lymphs Abs: 0.4 10*3/uL — ABNORMAL LOW (ref 0.7–4.0)
MCH: 29 pg (ref 26.0–34.0)
MCHC: 32.2 g/dL (ref 30.0–36.0)
MCV: 90.1 fL (ref 80.0–100.0)
Monocytes Absolute: 0.9 10*3/uL (ref 0.1–1.0)
Monocytes Relative: 9 %
Neutro Abs: 9.2 10*3/uL — ABNORMAL HIGH (ref 1.7–7.7)
Neutrophils Relative %: 87 %
Platelets: 137 10*3/uL — ABNORMAL LOW (ref 150–400)
RBC: 3.72 MIL/uL — ABNORMAL LOW (ref 3.87–5.11)
RDW: 16.4 % — ABNORMAL HIGH (ref 11.5–15.5)
nRBC: 0.4 % — ABNORMAL HIGH (ref 0.0–0.2)

## 2023-07-09 LAB — GLUCOSE, CAPILLARY: Glucose-Capillary: 147 mg/dL — ABNORMAL HIGH (ref 70–99)

## 2023-07-09 LAB — HEMOGLOBIN AND HEMATOCRIT, BLOOD
HCT: 31.2 % — ABNORMAL LOW (ref 36.0–46.0)
Hemoglobin: 9.9 g/dL — ABNORMAL LOW (ref 12.0–15.0)

## 2023-07-09 LAB — BPAM RBC
Blood Product Expiration Date: 202408252359
Blood Product Expiration Date: 202408252359
Blood Product Expiration Date: 202408252359
ISSUE DATE / TIME: 202407232148
Unit Type and Rh: 5100

## 2023-07-09 LAB — RENAL FUNCTION PANEL
Albumin: 2.4 g/dL — ABNORMAL LOW (ref 3.5–5.0)
Anion gap: 16 — ABNORMAL HIGH (ref 5–15)
BUN: 28 mg/dL — ABNORMAL HIGH (ref 8–23)
CO2: 22 mmol/L (ref 22–32)
Calcium: 8.2 mg/dL — ABNORMAL LOW (ref 8.9–10.3)
Chloride: 97 mmol/L — ABNORMAL LOW (ref 98–111)
Creatinine, Ser: 3.38 mg/dL — ABNORMAL HIGH (ref 0.44–1.00)
GFR, Estimated: 13 mL/min — ABNORMAL LOW (ref >60)
Glucose, Bld: 75 mg/dL (ref 70–99)
Sodium: 135 mmol/L (ref 135–145)

## 2023-07-09 LAB — PARATHYROID HORMONE, INTACT (NO CA)
PTH: 261 pg/mL — ABNORMAL HIGH (ref 15–65)
PTH: 425 pg/mL — ABNORMAL HIGH (ref 15–65)

## 2023-07-09 SURGERY — EGD (ESOPHAGOGASTRODUODENOSCOPY)
Anesthesia: Monitor Anesthesia Care

## 2023-07-09 MED ORDER — ALBUMIN HUMAN 25 % IV SOLN
25.0000 g | Freq: Once | INTRAVENOUS | Status: AC | PRN
  Administered 2023-07-21: 25 g via INTRAVENOUS

## 2023-07-09 MED ORDER — SODIUM CHLORIDE 0.9 % IV SOLN: INTRAVENOUS | Status: DC

## 2023-07-09 MED ORDER — PROPOFOL 10 MG/ML IV BOLUS
INTRAVENOUS | Status: DC | PRN
Start: 2023-07-09 — End: 2023-07-09
  Administered 2023-07-09: 30 mg via INTRAVENOUS

## 2023-07-09 MED ORDER — PROPOFOL 500 MG/50ML IV EMUL
INTRAVENOUS | Status: DC | PRN
  Administered 2023-07-09: 75 ug/kg/min via INTRAVENOUS

## 2023-07-09 MED ORDER — PHENYLEPHRINE HCL-NACL 20-0.9 MG/250ML-% IV SOLN
INTRAVENOUS | Status: DC | PRN
  Administered 2023-07-09: 60 ug/min via INTRAVENOUS

## 2023-07-09 MED ORDER — EPHEDRINE SULFATE-NACL 50-0.9 MG/10ML-% IV SOSY
PREFILLED_SYRINGE | INTRAVENOUS | Status: DC | PRN
  Administered 2023-07-09: 5 mg via INTRAVENOUS

## 2023-07-09 MED ORDER — PHENYLEPHRINE 80 MCG/ML (10ML) SYRINGE FOR IV PUSH (FOR BLOOD PRESSURE SUPPORT)
PREFILLED_SYRINGE | INTRAVENOUS | Status: DC | PRN
  Administered 2023-07-09 (×2): 160 ug via INTRAVENOUS

## 2023-07-09 NOTE — Progress Notes (Signed)
OT Cancellation Note  Patient Details Name: [name] MRN: [id number] DOB: May 15, 1946   Cancelled Treatment:    Reason Eval/Treat Not Completed: (P) Fatigue/lethargy limiting ability to participate, Pt still groggy from procedure, will re-attempt tomorrow  Alexis Goodell 07/09/2023, 3:37 PM

## 2023-07-09 NOTE — Progress Notes (Signed)
POST HD TX NOTE  07/09/23 1216  Vitals  Temp 98 F (36.7 C)  Temp Source Oral  BP (!) 115/57  MAP (mmHg) 75  BP Location Right Arm  BP Method Automatic  Patient Position (if appropriate) Lying  Pulse Rate 69  Pulse Rate Source Monitor  ECG Heart Rate 70  Resp 18  Oxygen Therapy  SpO2 100 %  O2 Device Tracheostomy Collar  O2 Flow Rate (L/min) 5 L/min  FiO2 (%) 28 %  Pulse Oximetry Type Continuous  During Treatment Monitoring  Intra-Hemodialysis Comments (S)   (post HD tx VS check)  Post Treatment  Dialyzer Clearance Lightly streaked  Duration of HD Treatment -hour(s) 3.5 hour(s)  Hemodialysis Intake (mL) 0 mL  Liters Processed 84  Fluid Removed (mL) 0 mL  Tolerated HD Treatment Yes  Post-Hemodialysis Comments (S)  tx completed w/o probelm, UF goal of keep even met, blood rinsed back, VSS. Medication Admin: none  AVG/AVF Arterial Site Held (minutes) 5 minutes  AVG/AVF Venous Site Held (minutes) 5 minutes  Fistula / Graft Left Upper arm Arteriovenous fistula  Placement Date/Time: 09/09/22 1116   Placed prior to admission: No  Orientation: Left  Access Location: Upper arm  Access Type: Arteriovenous fistula  Site Condition No complications  Fistula / Graft Assessment Bruit;Thrill;Present  Drainage Description None

## 2023-07-09 NOTE — Interval H&P Note (Signed)
History and Physical Interval Note:  07/09/2023 1:53 PM  [name]  has presented today for surgery, with the diagnosis of Acute on chronic anemia, history of small bowel AVMs, melena.  The various methods of treatment have been discussed with the patient and family. After consideration of risks, benefits and other options for treatment, the patient has consented to  Procedure(s): ESOPHAGOGASTRODUODENOSCOPY (EGD) (N/A) as a surgical intervention.  The patient's history has been reviewed, patient examined, no change in status, stable for surgery.  I have reviewed the patient's chart and labs.  Questions were answered to the patient's satisfaction.     Jenel Lucks

## 2023-07-09 NOTE — Transfer of Care (Signed)
Immediate Anesthesia Transfer of Care Note  Patient: [place]  Procedure(s) Performed: ESOPHAGOGASTRODUODENOSCOPY (EGD) HOT HEMOSTASIS (ARGON PLASMA COAGULATION/BICAP) HEMOSTASIS CONTROL  Patient Location: Endoscopy Unit  Anesthesia Type:MAC  Level of Consciousness: drowsy and patient cooperative  Airway & Oxygen Therapy: Patient Spontanous Breathing and Patient connected to T-piece oxygen  Post-op Assessment: Report given to RN, Post -op Vital signs reviewed and stable, and Patient moving all extremities X 4  Post vital signs: Reviewed and stable  Last Vitals:  Vitals Value Taken Time  BP 90/52 07/09/23 1435  Temp    Pulse 70   Resp 23 07/09/23 1437  SpO2    Vitals shown include unfiled device data.  Last Pain:  Vitals:   07/09/23 1239  TempSrc: Tympanic  PainSc: 0-No pain         Complications: No notable events documented.

## 2023-07-09 NOTE — Progress Notes (Signed)
PT Cancellation Note  Patient Details Name: [name] MRN: [id number] DOB: Apr 19, 1946   Cancelled Treatment:    Reason Eval/Treat Not Completed: Patient at procedure or test/unavailable, reattempt as pt and time allow.   Ivar Drape 07/09/2023, 10:15 AM  Samul Dada, PT PhD Acute Rehab Dept. Number: [facility] [id number] and [facility] [phone]

## 2023-07-09 NOTE — Progress Notes (Signed)
Patient ID: [name], female   DOB: October 23, 1946, 77 y.o.   MRN: [id number]    Progress Note from the Palliative Medicine Team at [facility]   Patient Name: [name]        Date: 07/09/2023 DOB: 04-Sep-1946  Age: 77 y.o. MRN#: [id number] Attending Physician: Dickie La, MD Primary Care Physician: [facility] Admit Date: 07/07/2023    Extensive chart review has been completed prior to meeting with patient/family  including labs, vital signs, imaging, progress/consult notes, orders, medications and available advance directive documents.   77 year old female with past medical history significant for complete heart block status post Abbott PPM in 1986, atrial fibrillation, hypertension, aortic atherosclerosis, laryngeal stenosis status post tracheostomy, CVA 5 of 2022, GI bleed 2012, AVM's, CHF, end-stage renal disease status post fistula--dialysis initiated this hospitalization, COPD, dementia.  Admitted for increased weakness and confusion over the last few days.  Patient is a member of [facility]  Admitted for treatment and stabilization   Family face treatment option decisions, advanced directive decisions and anticipatory care needs.  This NP assessed patient at the bedside as a follow up to  yesterday's GOCs meeting, /daughter H POA/at bedside. Patient returned from procedure, resting comfortably.   Continued conversation regarding current medical situation patient's multiple comorbidities.  Education offered on the barriers to the logistics of the patient needing outpatient dialysis with a trach.  Daughter suggest we contact patient's ENT/ Dr Jenne Pane considering decannulation of trach.   This has been discussed in the past, with Dr Jenne Pane.    This would alleviate barriers to OP dialysis .     Family is open to all offered and available medical interventions  to prolong life, including dialysis.      Education offered today regarding  the  importance of continued conversation with family and their  medical providers regarding overall plan of care and treatment options,  ensuring decisions are within the context of the patients values and GOCs.   I again tried to contact [facility] providers, await callback.   PMT will continue to support holistically.   This nurse practitioner informed  the family that I will be out of the hospital until Monday morning.  If the patient is still hospitalized I will follow-up at that time.  Call palliative medicine team phone # [phone] with questions or concerns in the interim   Questions and concerns addressed   Discussed with attending team  Time: 50 minutes  Detailed review of medical records ( labs, imaging, vital signs), medically appropriate exam ( MS, skin, resp)   discussed with treatment team, counseling and education to patient, family, staff, documenting clinical information, medication management, coordination of care    Lorinda Creed NP  Palliative Medicine Team Team Phone # [phone] Pager [phone]

## 2023-07-09 NOTE — Op Note (Signed)
[facility] Patient Name: [name] Procedure Date : 07/09/2023 MRN: [id number] Attending MD: Dub Amis. Tomasa Rand , MD, [id number] Date of Birth: 1946/06/16 CSN: [id number] Age: 77 Admit Type: Inpatient Procedure:                Small bowel enteroscopy Indications:              Melena, Recent gastrointestinal bleeding Providers:                Lorin Picket E. Tomasa Rand, MD, Lorenza Evangelist, RN, Harrington Challenger, Technician Referring MD:              Medicines:                Monitored Anesthesia Care Complications:            No immediate complications. Estimated Blood Loss:     Estimated blood loss was minimal. Procedure:                Pre-Anesthesia Assessment:                           - Prior to the procedure, a History and Physical                            was performed, and patient medications and                            allergies were reviewed. The patient's tolerance of                            previous anesthesia was also reviewed. The risks                            and benefits of the procedure and the sedation                            options and risks were discussed with the patient.                            All questions were answered, and informed consent                            was obtained. Prior Anticoagulants: The patient has                            taken Eliquis (apixaban), last dose was 2 days                            prior to procedure. ASA Grade Assessment: III - A                            patient with severe systemic disease. After  reviewing the risks and benefits, the patient was                            deemed in satisfactory condition to undergo the                            procedure.                           After obtaining informed consent, the endoscope was                            passed under direct vision. Throughout the                            procedure,  the patient's blood pressure, pulse, and                            oxygen saturations were monitored continuously. The                            PCF-H190TL ([id number] Olympus slim colonoscope was                            introduced through the mouth, and advanced to the                            jejunum, to the 150 cm mark (from the incisors).                            After obtaining informed consent, the endoscope was                            passed under direct vision. Throughout the                            procedure, the patient's blood pressure, pulse, and                            oxygen saturations were monitored continuously.The                            upper GI endoscopy was accomplished without                            difficulty. The patient tolerated the procedure                            well. Scope In: Scope Out: Findings:      The examined esophagus was normal.      A few (3-4) diminutive angiodysplastic lesions with stigmata of recent       bleeding were found in the gastric antrum. Coagulation for hemostasis       using argon plasma was successful. The gastric antral mucosa was very  friable. Water lavage of the mucosa created oozing on contact. This       oozing was then treated with APC ablation. To further reduce bleeding       caused by the procedure, 3 mL hemostatic gel was deployed over the APC       site. There was no bleeding at the end of the procedure.      The exam of the stomach was otherwise normal.      Two angiodysplastic lesions with no bleeding were found in the second       portion of the duodenum and in the fourth portion of the duodenum.       Coagulation for hemostasis using argon plasma was successful. Estimated       blood loss was minimal.      A tattoo was seen in the mid-jejunum. The tattoo site appeared normal.      There was no evidence of significant pathology in the proximal jejunum       and in the  mid-jejunum. Impression:               - Normal esophagus.                           - A few recently bleeding angiodysplastic lesions                            in the stomach. Treated with argon plasma                            coagulation (APC).                           - Two non-bleeding angiodysplastic lesions in the                            duodenum. Treated with argon plasma coagulation                            (APC).                           - A tattoo was seen in the jejunum. The tattoo site                            appeared normal.                           - The examined portion of the jejunum was normal.                           - No specimens collected. Moderate Sedation:      N/A Recommendation:           - Return patient to hospital ward for ongoing care.                           - Full liquid diet today.                           -  Use sucralfate suspension 1 gram PO QID for 2                            weeks.                           - Resume Eliquis (apixaban) at prior dose in 2 days.                           - Continue twice daily PPI. PO PPI okay.                           - If no evidence of further bleeding overnight,                            patient should be okay for discharge home. Procedure Code(s):        --- Professional ---                           [phone], Small intestinal endoscopy, enteroscopy                            beyond second portion of duodenum, not including                            ileum; with control of bleeding (eg, injection,                            bipolar cautery, unipolar cautery, laser, heater                            probe, stapler, plasma coagulator) Diagnosis Code(s):        --- Professional ---                           K31.811, Angiodysplasia of stomach and duodenum                            with bleeding                           K92.1, Melena (includes Hematochezia)                           K92.2,  Gastrointestinal hemorrhage, unspecified CPT copyright 2022 American Medical Association. All rights reserved. The codes documented in this report are preliminary and upon coder review may  be revised to meet current compliance requirements. Theresa Wedel E. Tomasa Rand, MD 07/09/2023 2:39:40 PM This report has been signed electronically. Number of Addenda: 0

## 2023-07-09 NOTE — Progress Notes (Addendum)
Subjective: [name] is a 77 year-old female who has a PMHx of of complete heart block s/p Abbott PPM in 1986, atrial fibrillation, HTN, aortic atherosclerosis, laryngeal stenosis s/p tracheostomy, CVA 04/2019, GI bleed 2012, AVMs, HCM/chronic congestive heart failure, ESRD s/p fistula, COPD who presented with decreased PO intake and melena found to have acute on chronic anemia and GI bleed   Receiving 3rd session of dialysis this AM. Reports she is feeling well. Oriented to self and place but not to time. Hemoglobin levels at baseline. Planning to get enteroscopy with GI in afternoon.   Daughter not present at bedside   Objective: Vital signs in last 24 hours: Vitals:   07/09/23 1200 07/09/23 1202 07/09/23 1216 07/09/23 1239  BP: (!) 109/56 (!) 107/57 (!) 115/57 (!) 114/51  Pulse: 70 70 69   Resp: 18 15 18 19   Temp:   98 F (36.7 C) (!) 96.8 F (36 C)  TempSrc:   Oral Tympanic  SpO2: 100% 100% 100% 100%  Weight:   48.8 kg    Weight change:   Intake/Output Summary (Last 24 hours) at 07/09/2023 1424 Last data filed at 07/09/2023 1216 Gross per 24 hour  Intake 120 ml  Output 0 ml  Net 120 ml    Physical Exam: General appearance: frail and ill appearing, awake and alert, NAD Eyes: conjunctivae/corneas clear. PERRL, EOM's intact Neck:  tracheostomy present, secretions present around site  Lungs: upper airways sounds, normal WOB  Heart: RRR, S1, S2 normal, no m/r/g Abdomen: soft, non-tender; bowel sounds normal; non-distended  Extremities: sarcopenic, no LE edema. LUE AV graft fistula in place Neurologic: oriented to self and place, not to time    Lab Results:    Latest Ref Rng & Units 07/09/2023    8:00 AM 07/09/2023    2:35 AM 07/08/2023    8:17 PM  CBC  WBC 4.0 - 10.5 K/uL  10.6    Hemoglobin 12.0 - 15.0 g/dL 9.9  16.1  09.6   Hematocrit 36.0 - 46.0 % 31.2  33.5  33.4   Platelets 150 - 400 K/uL  137        Latest Ref Rng & Units 07/09/2023    2:33 AM  07/08/2023    8:41 AM 07/07/2023    8:31 PM  CMP  Glucose 70 - 99 mg/dL 75  045  409   BUN 8 - 23 mg/dL 28  59  811   Creatinine 0.44 - 1.00 mg/dL 9.14  7.82  9.56   Sodium 135 - 145 mmol/L 135  135  135   Potassium 3.5 - 5.1 mmol/L 3.7  4.1  6.3   Chloride 98 - 111 mmol/L 97  101  105   CO2 22 - 32 mmol/L 22  22  11    Calcium 8.9 - 10.3 mg/dL 8.2  8.2  8.5   Total Protein 6.5 - 8.1 g/dL  5.7    Total Bilirubin 0.3 - 1.2 mg/dL  0.5    Alkaline Phos 38 - 126 U/L  48    AST 15 - 41 U/L  174    ALT 0 - 44 U/L  39     Recent Labs    07/08/23 2059  GLUCAP 80    Micro Results: No results found for this or any previous visit (from the past 240 hour(s)).  Studies/Results: DG Chest Port 1 View  Result Date: 07/07/2023 CLINICAL DATA:  Failure to thrive EXAM: PORTABLE CHEST 1 VIEW COMPARISON:  Previous studies including the examination of 04/23/2020 FINDINGS: Transverse diameter of heart is increased. There are no signs of alveolar pulmonary edema or focal pulmonary consolidation. Tip of tracheostomy is 7.7 cm above the carina. Pacemaker battery is seen in right infraclavicular region. There is no pleural effusion or pneumothorax. IMPRESSION: Cardiomegaly. There are no signs of pulmonary edema or focal pulmonary consolidation. Electronically Signed   By: Ernie Avena M.D.   On: 07/07/2023 15:27    Medications: I have reviewed the patient's current medications. Scheduled Meds:  [MAR Hold] sodium chloride   Intravenous Once   [MAR Hold] sodium chloride   Intravenous Once   [MAR Hold] Chlorhexidine Gluconate Cloth  6 each Topical Q0600   [MAR Hold] donepezil  10 mg Oral QHS   [MAR Hold] metoprolol succinate  12.5 mg Oral Daily   [MAR Hold] mirtazapine  7.5 mg Oral QHS   [MAR Hold] rosuvastatin  40 mg Oral QHS   Continuous Infusions:  sodium chloride     [MAR Hold] albumin human     [MAR Hold] pantoprazole     pantoprazole 8 mg/hr (07/08/23 0910)   PRN Meds:.[MAR Hold]  acetaminophen **OR** [MAR Hold] acetaminophen, [MAR Hold] albumin human, [MAR Hold] senna-docusate  Assessment/Plan: Principal Problem:   Acute on chronic anemia Active Problems:   Melena   ESRD on dialysis (HCC)   Acute encephalopathy   Gastrointestinal hemorrhage  [name] is a 77 year-old female who has a PMHx of of complete heart block s/p Abbott PPM in 1986, atrial fibrillation, HTN, aortic atherosclerosis, laryngeal stenosis s/p tracheostomy, CVA 04/2019, GI bleed 2012, AVMs, HCM/chronic congestive heart failure, ESRD s/p fistula, COPD who presented with decreased PO intake and melena found to have acute on chronic anemia and GI bleed   Active Problems   #Melena  H/o small bowel AVMs #Decreased PO intake #Acute on chronic anemia, improving  Hx of chronic anemia in the setting of ESRD, who presented with melena c/f acute blood loss. Also has hx of GI bleed in 2012 and reported small bowel AVMs in 2022 treated with APC. Colonoscopy in 2021 showed multiple polyps in colon that were resected. No cause for anemia was noted in the colon but was thought to be related to small bowel AVM. Iron levels low at 38, Tsat 4%, with ferritin 76. Hb within normal baseline at 10.8.  -GI following, appreciate recs, see below: -Small bowel enteroscopy 7/25 PM  -Cont IV protonix 80mg  continuous infusion , recommend switching to BID dosing  -CBC daily   -If Hb < 7, transfuse with 1u pRBCs -Hold Eliquis  -Consider IV iron replacement following procedure    #ESRD s/p AV graft fistula #Hyperkalemia, resolved   #Uremia  Left UE AV graft placement on 06/01/23. Unclear her baseline Cr but likely ~2-3 . Etiology for her renal failure unknown but suspected to be secondary to HTN. Completed 3 of 3 initiation dialysis sessions. PTH levels elevated   -Nephro following, planning for HD likely 7/27  -Daily BMP    #Dementia  Reported onset of dementia in 2021. Previously able to perform ADLs however  over the last few weeks patient has deviated from her baseline and requires the support of her family members to care for her. She is now oriented to person and place. BUN significantly elevated on initial exam likely worsening her mental status.  -Continue home donepezil 10mg  daily   -Palliative care consulted for goals of care  -PT/OT following   #Trach collar Patient  now requiring 5 L supplemental O2 through trach collar with increased secretions noted this morning. Plan was for outpatient decannulation, given that trach will be a barrier to getting outpatient dialysis. We have consulted her ENT to discuss this, while she is admitted   Chronic Problems   #Atrial fibrillation #HLD #H/o stroke Reported stroke on 04/2019 with residual expressive aphasia. Afib has since been rate controlled with Metoprolol succinate 12.5 daily. Last lipid panel showed LDL 55 (05/29/23) -Cont home rosuvastatin 40mg   -Continue metoprolol succinate 12.5mg  daily   -Hold Eliquis iso melena   #Chronic CHF  HCM  #Complete heart block s/p PPM (1986)  Follows with HF clinic. Home GDMT incldues Metoprolol succinate 12.5mg  daily, Torsemide 40mg  every day. Last ECHO 09/2021 LVEF 40-45%   -Continue Metoprolol succinate  -Hold diuretic iso dialysis     #HTN: Well-controlled at home -Continue antihypertensives as stated above   This is a Psychologist, occupational Note.  The care of the patient was discussed with Dr. Sol Blazing and the assessment and plan formulated with their assistance.  Please see their attached note for official documentation of the daily encounter.   LOS: 2 days   Loyal Buba 07/09/2023, 2:24 PM   Attestation for Student Documentation:  I personally was present and performed or re-performed the history, physical exam and medical decision-making activities of this service and have verified that the service and findings are accurately documented in the student's note.  Chauncey Mann,  DO 07/09/2023, 2:48 PM

## 2023-07-09 NOTE — Plan of Care (Signed)
  Problem: Education: Goal: Knowledge of General Education information will improve Description Including pain rating scale, medication(s)/side effects and non-pharmacologic comfort measures Outcome: Progressing   

## 2023-07-09 NOTE — Anesthesia Preprocedure Evaluation (Signed)
Anesthesia Evaluation  Patient identified by MRN, date of birth, ID band Patient awake    Reviewed: Allergy & Precautions, NPO status , Patient's Chart, lab work & pertinent test results  History of Anesthesia Complications Negative for: history of anesthetic complications  Airway Mallampati: Trach       Dental   Pulmonary asthma , sleep apnea , COPD, former smoker trach   + rhonchi        Cardiovascular hypertension, Pt. on medications and Pt. on home beta blockers + Past MI and +CHF  + dysrhythmias + pacemaker  Rhythm:Regular  1. Speckled appearance of the myocardium, concerning for possible  infiltrative cardiomyopathy such as amyloid (although patient carries  diagnosis of HCM) - no evidence for increased LVOT gradient.. Left  ventricular ejection fraction, by estimation, is  40 to 45%. The left ventricle has mildly decreased function. The left  ventricle demonstrates global hypokinesis. There is severe left  ventricular hypertrophy. Left ventricular diastolic parameters are  consistent with Grade II diastolic dysfunction  (pseudonormalization). Elevated left ventricular end-diastolic pressure.  The E/e' is 22.   2. Abnormal RV free wall strain at -7.9%. RV myocardium is speckled.  Right ventricular systolic function is mildly reduced. The right  ventricular size is normal. Mildly increased right ventricular wall  thickness. There is moderately elevated pulmonary  artery systolic pressure. The estimated right ventricular systolic  pressure is 52.1 mmHg.   3. Left atrial size was massively dilated.   4. The pericardial effusion is posterior to the left ventricle.   5. The mitral valve is abnormal. Mild to moderate mitral valve  regurgitation. Moderate mitral annular calcification.   6. Tricuspid valve regurgitation is mild to moderate.   7. The aortic valve is tricuspid. Aortic valve regurgitation is not  visualized. Mild  aortic valve sclerosis is present, with no evidence of  aortic valve stenosis.   8. Aortic dilatation noted. There is mild dilatation of the ascending  aorta, measuring 38 mm.   9. The inferior vena cava is dilated in size with >50% respiratory  variability, suggesting right atrial pressure of 8 mmHg.     Neuro/Psych  PSYCHIATRIC DISORDERS Anxiety Depression    CVA, Residual Symptoms    GI/Hepatic Neg liver ROS,GERD  ,,? GI bleed   Endo/Other  diabetes    Renal/GU ESRF and DialysisRenal diseaseHd today  Lab Results      Component                Value               Date                      NA                       135                 07/09/2023                K                        3.7                 07/09/2023                CO2  22                  07/09/2023                GLUCOSE                  75                  07/09/2023                BUN                      28 (H)              07/09/2023                CREATININE               3.38 (H)            07/09/2023                CALCIUM                  8.2 (L)             07/09/2023                GFR                      16.19 (L)           10/01/2021                EGFR                     23 (L)              10/28/2021                GFRNONAA                 13 (L)              07/09/2023                Musculoskeletal  (+) Arthritis ,    Abdominal   Peds  Hematology  (+) Blood dyscrasia, anemia Lab Results      Component                Value               Date                      WBC                      10.6 (H)            07/09/2023                HGB                      9.9 (L)             07/09/2023                HCT                      31.2 (L)            07/09/2023  MCV                      90.1                07/09/2023                PLT                      137 (L)             07/09/2023              Anesthesia Other Findings   Reproductive/Obstetrics                              Anesthesia Physical Anesthesia Plan  ASA: 4  Anesthesia Plan: MAC   Post-op Pain Management: Minimal or no pain anticipated   Induction: Intravenous  PONV Risk Score and Plan: 2 and Propofol infusion and Treatment may vary due to age or medical condition  Airway Management Planned: Tracheostomy  Additional Equipment: None  Intra-op Plan:   Post-operative Plan:   Informed Consent: I have reviewed the patients History and Physical, chart, labs and discussed the procedure including the risks, benefits and alternatives for the proposed anesthesia with the patient or authorized representative who has indicated his/her understanding and acceptance.     Dental advisory given  Plan Discussed with: CRNA  Anesthesia Plan Comments:        Anesthesia Quick Evaluation

## 2023-07-09 NOTE — Progress Notes (Signed)
OT Cancellation Note  Patient Details Name: [name] MRN: [id number] DOB: 10-08-1946   Cancelled Treatment:    Reason Eval/Treat Not Completed: (P) Patient at procedure or test/ unavailable  Alexis Goodell 07/09/2023, 11:09 AM

## 2023-07-09 NOTE — Progress Notes (Signed)
Nephrology Follow-Up Consult note   Assessment/Recommendations: [name] is a/an 77 y.o. female with a past medical history significant for CKD V, CHF, GI bleeding/AVMs, DM2, afib, dementia, frailty, admitted for GI bleeding and ARF.       AKI on CKD V --> ESRD.  Poor candidate but after extensive discussions family wants to continue pursuing dialysis             - Continue with 3/3 initiation sessions today  - next HD likely saturday  - start ESRD CLIP; likely will require LTACH unless trach can be capped            - continue with AVG, advance needle size today to 15g             - pal care involved   2.  Hyperkalemia: now resolved   3.  Anemia:             - suspect GI bleed on top of anemia of renal failure             - Transfusion per primary team  - GI with plans for push enteroscopy  - consider ESA   4.  BMM             - F/u PTH, phos at goal  - ca corrects to normal when accounting for albumin   5.  Dementia:             - on Aricept             - agree with pall care c/s   6.  Dispo: floor   Recommendations conveyed to primary service.    [name] [facility] 07/09/2023 10:20 AM  ___________________________________________________________  CC: weakness, AMS  Interval History/Subjective: Patient states she feels fine this morning.  Denies any complaints.  Blood pressure fairly low this morning.   Medications:  Current Facility-Administered Medications  Medication Dose Route Frequency Provider Last Rate Last Admin   0.9 %  sodium chloride infusion (Manually program via Guardrails IV Fluids)   Intravenous Once Rondel Baton, MD       0.9 %  sodium chloride infusion (Manually program via Guardrails IV Fluids)   Intravenous Once Champ Mungo, DO       acetaminophen (TYLENOL) tablet 650 mg  650 mg Oral Q6H PRN Atway, [name], DO       Or   acetaminophen (TYLENOL) suppository 650 mg  650 mg Rectal Q6H PRN Atway, [name],  DO       albumin human 25 % solution 25 g  25 g Intravenous Once PRN [name], MD       Chlorhexidine Gluconate Cloth 2 % PADS 6 each  6 each Topical Q0600 Bufford Buttner, MD       donepezil (ARICEPT) tablet 10 mg  10 mg Oral QHS Atway, [name], DO   10 mg at 07/08/23 2138   metoprolol succinate (TOPROL-XL) 24 hr tablet 12.5 mg  12.5 mg Oral Daily Atway, [name], DO   12.5 mg at 07/08/23 0806   mirtazapine (REMERON) tablet 7.5 mg  7.5 mg Oral QHS Atway, [name], DO   7.5 mg at 07/08/23 2138   pantoprazole (PROTONIX) 80 mg /NS 100 mL IVPB  80 mg Intravenous Once Rondel Baton, MD       pantoprozole (PROTONIX) 80 mg /NS 100 mL infusion  8 mg/hr Intravenous Continuous Rondel Baton, MD 10 mL/hr at  07/08/23 0910 8 mg/hr at 07/08/23 0910   rosuvastatin (CRESTOR) tablet 40 mg  40 mg Oral QHS Atway, [name], DO   40 mg at 07/08/23 2136   senna-docusate (Senokot-S) tablet 1 tablet  1 tablet Oral QHS PRN Atway, [name], DO          Review of Systems: 10 systems reviewed and negative except per interval history/subjective  Physical Exam: Vitals:   07/09/23 0930 07/09/23 1000  BP: 109/62 (!) 106/56  Pulse: 70 70  Resp: 16 15  Temp:    SpO2: 100% 100%   No intake/output data recorded.  Intake/Output Summary (Last 24 hours) at 07/09/2023 1020 Last data filed at 07/09/2023 0600 Gross per 24 hour  Intake 120 ml  Output 0 ml  Net 120 ml   Constitutional: lying in bed, no acute distress ENMT: ears and nose without scars or lesions, MMM, trach in place with purulent sputum CV: normal rate, no edema Respiratory: coarse upper airway sounds, normal work of breathing Gastrointestinal: soft, non-tender, no palpable masses or hernias Skin: no visible lesions or rashes Psych: alert, judgement/insight impaired, appropriate mood and affect   Test Results I personally reviewed new and old clinical labs and radiology tests Lab Results  Component Value Date   NA 135  07/09/2023   K 3.7 07/09/2023   CL 97 (L) 07/09/2023   CO2 22 07/09/2023   BUN 28 (H) 07/09/2023   CREATININE 3.38 (H) 07/09/2023   GFR 16.19 (L) 10/01/2021   GLU 96 07/28/2016   CALCIUM 8.2 (L) 07/09/2023   ALBUMIN 2.4 (L) 07/09/2023   PHOS 4.4 07/09/2023    CBC Recent Labs  Lab 07/07/23 1545 07/07/23 2031 07/08/23 0841 07/08/23 2017 07/09/23 0235 07/09/23 0800  WBC 13.8* 13.1* 13.2*  --  10.6*  --   NEUTROABS 13.0*  --   --   --  9.2*  --   HGB 5.8* 4.7* 9.9* 10.6* 10.8* 9.9*  HCT 22.9* 17.4* 31.2* 33.4* 33.5* 31.2*  MCV 103.2* 95.1 89.7  --  90.1  --   PLT 304 285 197  --  137*  --

## 2023-07-10 DIAGNOSIS — N186 End stage renal disease: Secondary | ICD-10-CM

## 2023-07-10 DIAGNOSIS — Z992 Dependence on renal dialysis: Secondary | ICD-10-CM | POA: Diagnosis not present

## 2023-07-10 DIAGNOSIS — E43 Unspecified severe protein-calorie malnutrition: Secondary | ICD-10-CM | POA: Diagnosis present

## 2023-07-10 DIAGNOSIS — K921 Melena: Secondary | ICD-10-CM | POA: Diagnosis not present

## 2023-07-10 DIAGNOSIS — Z758 Other problems related to medical facilities and other health care: Secondary | ICD-10-CM | POA: Diagnosis not present

## 2023-07-10 DIAGNOSIS — K31811 Angiodysplasia of stomach and duodenum with bleeding: Secondary | ICD-10-CM

## 2023-07-10 DIAGNOSIS — Z7901 Long term (current) use of anticoagulants: Secondary | ICD-10-CM | POA: Diagnosis not present

## 2023-07-10 DIAGNOSIS — D649 Anemia, unspecified: Secondary | ICD-10-CM | POA: Diagnosis not present

## 2023-07-10 LAB — CBC: MCHC: 31.2 g/dL (ref 30.0–36.0)

## 2023-07-10 MED ORDER — MIDODRINE HCL 5 MG PO TABS
10.0000 mg | ORAL_TABLET | ORAL | Status: DC
  Administered 2023-07-11 – 2023-07-21 (×5): 10 mg via ORAL
  Filled 2023-07-10 (×7): qty 2

## 2023-07-10 MED ORDER — POTASSIUM CHLORIDE CRYS ER 20 MEQ PO TBCR
20.0000 meq | EXTENDED_RELEASE_TABLET | Freq: Once | ORAL | Status: AC
  Administered 2023-07-10: 20 meq via ORAL
  Filled 2023-07-10: qty 1

## 2023-07-10 MED ORDER — PANTOPRAZOLE SODIUM 40 MG PO TBEC
40.0000 mg | DELAYED_RELEASE_TABLET | Freq: Two times a day (BID) | ORAL | Status: DC
  Administered 2023-07-10 – 2023-07-24 (×28): 40 mg via ORAL
  Filled 2023-07-10 (×28): qty 1

## 2023-07-10 MED ORDER — APIXABAN 2.5 MG PO TABS
2.5000 mg | ORAL_TABLET | Freq: Two times a day (BID) | ORAL | Status: DC
  Administered 2023-07-11 – 2023-07-17 (×14): 2.5 mg via ORAL
  Filled 2023-07-10 (×14): qty 1

## 2023-07-10 MED ORDER — ENSURE ENLIVE PO LIQD
237.0000 mL | Freq: Two times a day (BID) | ORAL | Status: DC
  Administered 2023-07-10 – 2023-07-21 (×13): 237 mL via ORAL

## 2023-07-10 MED ORDER — SUCRALFATE 1 GM/10ML PO SUSP
1.0000 g | Freq: Three times a day (TID) | ORAL | Status: AC
  Administered 2023-07-10 – 2023-07-23 (×50): 1 g via ORAL
  Filled 2023-07-10 (×50): qty 10

## 2023-07-10 NOTE — TOC Progression Note (Signed)
Transition of Care [facility]) - Initial/Assessment Note    Patient Details  Name: [name] MRN: [id number] Date of Birth: 09/07/1946  [facility]) CM/SW Contact:    Ralene Bathe, LCSW Phone Number: 07/10/2023, 2:30 PM  Clinical Narrative:                 LCSW received consult for [facility] and contacted the patient's PACE social worker, Marylene Land, and provided update.  Marylene Land states that PACE would have to approve placement at a [facility] and requested that the current attending contact the patient's PCP Reinaldo Raddle [phone]) to discuss the need for LTACH.  MD informed of the above.  TOC following.        Patient Goals and CMS Choice            Expected Discharge Plan and Services                                              Prior Living Arrangements/Services                       Activities of Daily Living      Permission Sought/Granted                  Emotional Assessment              Admission diagnosis:  Hyperkalemia [E87.5] Patient Active Problem List   Diagnosis Date Noted   AVM (arteriovenous malformation) of duodenum, acquired with hemorrhage 07/09/2023   AVM (arteriovenous malformation) of stomach, acquired with hemorrhage 07/09/2023   Melena 07/08/2023   ESRD on dialysis (HCC) 07/08/2023   Acute encephalopathy 07/08/2023   Gastrointestinal hemorrhage 07/08/2023   Acute on chronic anemia 07/07/2023   AKI (acute kidney injury) (HCC) 04/23/2020   Cough with hemoptysis 08/02/2019   SOB (shortness of breath)    Atypical chest pain    Chest pain on exertion 05/15/2019   Cerebral embolism with cerebral infarction 05/09/2019   Expressive aphasia 05/08/2019   Other abnormal glucose 11/26/2018   Polypharmacy 06/25/2018   Depression with anxiety    Tracheostomy present (HCC)    Tracheostomy care (HCC)    Calculus of gallbladder without cholecystitis without obstruction 12/24/2017   Chronic cough  09/25/2017   History of colonic polyps    Polyp of transverse colon    Polyp of sigmoid colon    Moderate protein-calorie malnutrition (HCC) 06/02/2017   Chronic idiopathic constipation 03/19/2017   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   Permanent atrial fibrillation (HCC)    Laryngeal stenosis 09/18/2016   Esophageal dysphagia    Polymyalgia rheumatica (HCC) 04/10/2015   Hypertrophic cardiomyopathy (HCC) 02/15/2015   Eczema 09/18/2014   Environmental allergies 09/18/2014   Pre-diabetes 11/26/2013   COPD, moderate (HCC) 11/01/2013   Complete heart block (HCC) 04/03/2011   Acute on chronic diastolic heart failure (HCC) 02/20/2011   Anemia 02/17/2011   AVM (arteriovenous malformation) of small bowel, acquired 02/13/2011   Current use of long term anticoagulation 01/15/2011   PACEMAKER-St.Jude 11/28/2010   CKD (chronic kidney disease) stage 4, GFR 15-29 ml/min (HCC) 09/24/2010   Acute on chronic congestive heart failure (HCC) 08/30/2010   ARTHRITIS 07/24/2010   Generalized anxiety disorder 07/23/2010   Hyperlipidemia 02/11/2007   Essential hypertension 02/11/2007   APNEA, SLEEP 02/11/2007   PCP:  Inc, [address] And  [facility] [place] [facility]:   [facility] - [place], [place] - [address] [place] [address] Phone: [phone] Fax: [phone]     Social Determinants of Health (SDOH) Social History: SDOH Screenings   Depression (PHQ2-9): Low Risk  (05/20/2019)  Tobacco Use: Medium Risk (07/09/2023)   SDOH Interventions: Transportation Interventions: Intervention Not Indicated, Inpatient TOC, Patient Resources (Friends/Family)   Readmission Risk Interventions    07/08/2023    3:26 PM  Readmission Risk Prevention Plan  Transportation Screening Complete  PCP or Specialist Appt within 3-5 Days Complete  HRI or Home Care Consult Complete  Social Work Consult for Recovery Care Planning/Counseling Complete  Palliative  Care Screening Not Applicable  Medication Review Oceanographer) Referral to Pharmacy

## 2023-07-10 NOTE — Progress Notes (Addendum)
Subjective: [name] is a 77 year-old female who has a PMHx of of complete heart block s/p Abbott PPM in 1986, atrial fibrillation, HTN, aortic atherosclerosis, laryngeal stenosis s/p tracheostomy, CVA 04/2019, GI bleed 2012, AVMs, HCM/chronic congestive heart failure, ESRD s/p fistula, COPD who presented with decreased PO intake and melena found to have acute on chronic anemia and GI bleed   Daughter present at room. States patient has been improving. Denies SOB, CP, dizziness, weakness. S/p enteroscopy with no complications   Objective: Vital signs in last 24 hours: Vitals:   07/10/23 0813 07/10/23 0821 07/10/23 0900 07/10/23 1108  BP: (!) 99/55  (!) 91/55   Pulse: 63 69 86 69  Resp: 19 18 14  (!) 25  Temp: 97.8 F (36.6 C)     TempSrc:      SpO2: 100% 98% 96% 97%  Weight:       Weight change: -1.1 kg  Intake/Output Summary (Last 24 hours) at 07/10/2023 1240 Last data filed at 07/10/2023 0800 Gross per 24 hour  Intake 542.17 ml  Output 0 ml  Net 542.17 ml    Physical Exam: General appearance: frail and ill appearing, awake and alert, NAD Eyes: conjunctivae/corneas clear. PERRL, EOM's intact Neck:  tracheostomy present Lungs: normal WOB, CTAB  Heart: RRR, S1, S2 normal, no m/r/g Abdomen: soft, NTND Extremities: sarcopenic, no LE edema. LUE AV graft fistula in place Neurologic: oriented to self and place, not to time    Lab Results:    Latest Ref Rng & Units 07/10/2023    2:31 AM 07/09/2023    8:00 AM 07/09/2023    2:35 AM  CBC  WBC 4.0 - 10.5 K/uL 9.7   10.6   Hemoglobin 12.0 - 15.0 g/dL 9.9  9.9  59.5   Hematocrit 36.0 - 46.0 % 31.7  31.2  33.5   Platelets 150 - 400 K/uL 134   137       Latest Ref Rng & Units 07/10/2023    2:31 AM 07/09/2023    2:33 AM 07/08/2023    8:41 AM  CMP  Glucose 70 - 99 mg/dL 94  75  638   BUN 8 - 23 mg/dL 17  28  59   Creatinine 0.44 - 1.00 mg/dL 7.56  4.33  2.95   Sodium 135 - 145 mmol/L 134  135  135   Potassium 3.5 - 5.1  mmol/L 3.2  3.7  4.1   Chloride 98 - 111 mmol/L 98  97  101   CO2 22 - 32 mmol/L 26  22  22    Calcium 8.9 - 10.3 mg/dL 7.8  8.2  8.2   Total Protein 6.5 - 8.1 g/dL   5.7   Total Bilirubin 0.3 - 1.2 mg/dL   0.5   Alkaline Phos 38 - 126 U/L   48   AST 15 - 41 U/L   174   ALT 0 - 44 U/L   39    Recent Labs    07/08/23 2059 07/09/23 2030  GLUCAP 80 147*    Micro Results: No results found for this or any previous visit (from the past 240 hour(s)).  Studies/Results: No results found.  Medications: I have reviewed the patient's current medications. Scheduled Meds:  sodium chloride   Intravenous Once   sodium chloride   Intravenous Once   [START ON 07/11/2023] apixaban  2.5 mg Oral BID   Chlorhexidine Gluconate Cloth  6 each Topical Q0600   donepezil  10  mg Oral QHS   metoprolol succinate  12.5 mg Oral Daily   [START ON 07/11/2023] midodrine  10 mg Oral Q T,Th,Sa-HD   mirtazapine  7.5 mg Oral QHS   pantoprazole  40 mg Oral BID   rosuvastatin  40 mg Oral QHS   sucralfate  1 g Oral TID AC & HS   Continuous Infusions:  albumin human     PRN Meds:.acetaminophen **OR** acetaminophen, albumin human, senna-docusate  Assessment/Plan: Principal Problem:   Acute on chronic anemia Active Problems:   Current use of long term anticoagulation   Tracheostomy present (HCC)   Melena   ESRD on dialysis (HCC)   Acute encephalopathy   Gastrointestinal hemorrhage   AVM (arteriovenous malformation) of duodenum, acquired with hemorrhage   AVM (arteriovenous malformation) of stomach, acquired with hemorrhage  [name] is a 77 year-old female who has a PMHx of of complete heart block s/p Abbott PPM in 1986, atrial fibrillation, HTN, aortic atherosclerosis, laryngeal stenosis s/p tracheostomy, CVA 04/2019, GI bleed 2012, AVMs, HCM/chronic congestive heart failure, ESRD s/p fistula, COPD who presented with decreased PO intake and melena found to have acute on chronic anemia and GI bleed    Active Problems   #Melena  H/o small bowel AVMs #Decreased PO intake #Acute on chronic anemia, stable   Hx of chronic anemia in the setting of ESRD, who presented with melena c/f acute blood loss. Also has hx of GI bleed in 2012 and reported small bowel AVMs in 2022 treated with APC. Colonoscopy in 2021 showed multiple polyps in colon that were resected. Iron levels low at 38, Tsat 4%, with ferritin 76. Enteroscopy on 7/25 showed normal appearing esophagus, 3-4 angiodysplastic lesions located at the stomach, and 2 lesions at duodenum which were all treated with APC  -GI following, appreciate recs, deemed appropriate for discharge  -Transition to PO protonix 40mg  BID  -Start Carafate TID for 2 weeks  -Hold Eliquis. Plan to resume on 7/27   -Consider IV iron replacement   #ESRD s/p AV graft fistula #Hyperkalemia, resolved   #Uremia  Left UE AV graft placement on 06/01/23. Unclear her baseline Cr but likely ~2-3. Etiology for her renal failure unknown but suspected to be secondary to HTN. Completed 3 of 3 initiation dialysis sessions. PTH levels elevated. Hemodialysis schedule T/Th/Sat. HbsAb noted to show incomplete immunity. HBsAg non-reactive   -Nephro following, planning for HD likely 7/27  -Recommended to start midodrine 10mg  when at HD -Recommended obtaining HBV vaccine for ongoing dialysis  -Daily BMP    #Dementia  Reported onset of dementia in 2021. Previously able to perform ADLs however over the last few weeks patient has deviated from her baseline and requires the support of her family members to care for her. She is now oriented to person and place. BUN significantly elevated on initial exam likely worsening her mental status.  -Continue home donepezil 10mg  daily   -Palliative care consulted for goals of care  -PT/OT following   #Trach collar Patient now requiring 5 L supplemental O2 through trach collar with increased secretions noted this morning. Plan was for outpatient  decannulation, given that trach will be a barrier to getting outpatient dialysis. We have consulted her ENT to discuss this, while she is admitted -Follow-up with ENT and social work    Chronic Problems   #Atrial fibrillation #HLD #H/o stroke Reported stroke on 04/2019 with residual expressive aphasia. Afib has since been rate controlled with Metoprolol succinate 12.5 daily. Last lipid panel showed  LDL 55 (05/29/23) -Cont home rosuvastatin 40mg   -Continue metoprolol succinate 12.5mg  daily   -Hold Eliquis. Plan to resume on 7/27     #Chronic CHF  HCM  #Complete heart block s/p PPM (1986)  Follows with HF clinic. Home GDMT incldues Metoprolol succinate 12.5mg  daily, Torsemide 40mg  every day. Last ECHO 09/2021 LVEF 40-45%   -Continue Metoprolol succinate  -Hold diuretic iso dialysis     #HTN: Well-controlled at home -Continue antihypertensives as stated above   This is a Psychologist, occupational Note.  The care of the patient was discussed with Dr. Sol Blazing and the assessment and plan formulated with their assistance.  Please see their attached note for official documentation of the daily encounter.   LOS: 3 days   Loyal Buba 07/10/2023, 12:40 PM

## 2023-07-10 NOTE — Progress Notes (Signed)
This patient is well-known to Dr. Jenne Pane, one of my partners.  I spoke with him yesterday.  He informed me that in the past he has discussed with her decannulation and she has not shown interest.  We can go ahead and try to plug the tracheostomy for a few days and if she tolerates this very well then we can decannulate.

## 2023-07-10 NOTE — Progress Notes (Signed)
Pt tracheostomy capped per MD order. RT preformed tracheostomy care, cleansed, dried, and applied new dressing to trach, as well as suctioned pt prior to appling cap. Pt tolerated well, no increased WOB noted, vitals stable, SpO2 98% on RA, RN aware, RT will monitor as needed.     07/10/23 1430  Therapy Vitals  Pulse Rate 68  Resp 18  MEWS Score/Color  MEWS Score 1  MEWS Score Color Green  Respiratory Assessment  Assessment Type Assess only  Respiratory Pattern Regular;Unlabored  Chest Assessment Chest expansion symmetrical  Cough Productive  Sputum Amount Small  Sputum Color White;Yellow  Sputum Consistency Thick  Sputum Specimen Source Tracheostomy tube  Bilateral Breath Sounds Diminished  Oxygen Therapy/Pulse Ox  O2 Device (S)  Room Air  O2 Therapy (S)  Room air  Tracheostomy Shiley Flexible 4 mm Uncuffed  Placement Date: (c)    Brand: Shiley Flexible  Size (mm): 4 mm  Style: Uncuffed  Status (S)  Capped;Secured with trach ties  Site Assessment Clean;Dry  Site Care Cleansed;Dried;Dressing applied  Ties Assessment Clean, Dry  Tracheostomy Equipment at bedside Yes and checklist posted at head of bed

## 2023-07-10 NOTE — Progress Notes (Addendum)
Progress Note   LOS: 3 days   Chief Complaint: GI bleed   Subjective   Daughter at bedside.  Patient did well overnight.  No further bleeding at this time per daynight nurse. RN states small amount of melena reported from night shift.  Tolerating diet without difficulty   Objective   Vital signs in last 24 hours: Temp:  [96.8 F (36 C)-98.5 F (36.9 C)] 97.8 F (36.6 C) (07/26 0813) Pulse Rate:  [63-76] 69 (07/26 0821) Resp:  [14-23] 18 (07/26 0821) BP: (88-115)/(49-60) 99/55 (07/26 0813) SpO2:  [94 %-100 %] 98 % (07/26 0821) FiO2 (%):  [28 %] 28 % (07/26 0821) Weight:  [48.8 kg] 48.8 kg (07/25 1216) Last BM Date : 07/08/23 Last BM recorded by nurses in past 5 days Stool Type: Type 6 (Mushy consistency with ragged edges) (07/08/2023 12:33 AM)  General:   female in no acute distress  Heart:  Regular rate and rhythm; no murmurs Pulm: Clear anteriorly; no wheezing Abdomen: soft, nondistended, normal bowel sounds in all quadrants. Nontender without guarding. No organomegaly appreciated. Extremities:  No edema Neurologic: Disoriented, baseline Psych:  Cooperative. Normal mood and affect.  Intake/Output from previous day: 07/25 0701 - 07/26 0700 In: 415 [P.O.:120; I.V.:295] Out: 0  Intake/Output this shift: Total I/O In: 127.2 [P.O.:120; I.V.:7.2] Out: -   Studies/Results: No results found.  Lab Results: Recent Labs    07/08/23 0841 07/08/23 2017 07/09/23 0235 07/09/23 0800 07/10/23 0231  WBC 13.2*  --  10.6*  --  9.7  HGB 9.9*   < > 10.8* 9.9* 9.9*  HCT 31.2*   < > 33.5* 31.2* 31.7*  PLT 197  --  137*  --  134*   < > = values in this interval not displayed.   BMET Recent Labs    07/08/23 0841 07/09/23 0233 07/10/23 0231  NA 135 135 134*  K 4.1 3.7 3.2*  CL 101 97* 98  CO2 22 22 26   GLUCOSE 100* 75 94  BUN 59* 28* 17  CREATININE 4.94* 3.38* 2.76*  CALCIUM 8.2* 8.2* 7.8*   LFT Recent Labs    07/08/23 0841 07/09/23 0233 07/10/23 0231  PROT  5.7*  --   --   ALBUMIN 2.7*   < > 2.1*  AST 174*  --   --   ALT 39  --   --   ALKPHOS 48  --   --   BILITOT 0.5  --   --    < > = values in this interval not displayed.   PT/INR Recent Labs    07/08/23 0841  LABPROT 18.9*  INR 1.6*     Scheduled Meds:  sodium chloride   Intravenous Once   sodium chloride   Intravenous Once   [START ON 07/11/2023] apixaban  2.5 mg Oral BID   Chlorhexidine Gluconate Cloth  6 each Topical Q0600   donepezil  10 mg Oral QHS   metoprolol succinate  12.5 mg Oral Daily   [START ON 07/11/2023] midodrine  10 mg Oral Q T,Th,Sa-HD   mirtazapine  7.5 mg Oral QHS   pantoprazole  40 mg Oral BID   potassium chloride  20 mEq Oral Once   rosuvastatin  40 mg Oral QHS   sucralfate  1 g Oral TID AC & HS   Continuous Infusions:  albumin human        Impression:  77 year old female history of laryngeal stenosis s/p tracheostomy and ESRD s/p fistula just started  dialysis overnight with history of chronic GI blood loss second to AVMs with most recent small bowel enteroscopy in 2022 showing 7 angioectasias throughout stomach, duodenum, jejunum treated with APC presenting with acute on chronic symptomatic anemia and failure to thrive Hgb 9.9 MCV 91.4 Iron 13, TIBC 353, saturation 4% Fecal occult positive Small bowel endoscopy 07/09/2023: Few bleeding angiodysplastic lesions in stomach treated with APC.  2 nonbleeding angiodysplastic lesions in duodenum treated with APC.  Tattoo seen in jejunum.  No specimens collected.  A-fib on Eliquis -Last Eliquis dose 7/23 AM   ESRD s/p fistula  BUN 17, creatinine 2.76, GFR 17.  Significantly improved   Failure to thrive -Palliative care following   Plan:   -Can resume Eliquis tomorrow -Continue PPI p.o. twice daily -Carafate suspension 1 g p.o. 4 times daily for 2 weeks -hemoglobin stable, possibly sign off today or tomorrow as long as no further bleeding and hgb remains stable.  Defer to Dr. Charlean Sanfilippo M  Vonzell Lindblad  07/10/2023, 10:00 AM

## 2023-07-10 NOTE — Care Management Important Message (Signed)
Important Message  Patient Details  Name: [name] MRN: [id number] Date of Birth: [birth year]   Medicare Important Message Given:  Yes     [name] 07/10/2023, 1:35 PM

## 2023-07-10 NOTE — Plan of Care (Addendum)
Patient alert/oriented X2. Patient disoriented to time and situation. Patient VSS and compliant with medication administration. No complaints at this time. Patient turned Q2 hours.   Problem: Education: Goal: Knowledge of General Education information will improve Description: Including pain rating scale, medication(s)/side effects and non-pharmacologic comfort measures Outcome: Progressing   Problem: Health Behavior/Discharge Planning: Goal: Ability to manage health-related needs will improve Outcome: Progressing   Problem: Clinical Measurements: Goal: Ability to maintain clinical measurements within normal limits will improve Outcome: Progressing   Problem: Clinical Measurements: Goal: Will remain free from infection Outcome: Progressing   Problem: Clinical Measurements: Goal: Diagnostic test results will improve Outcome: Progressing   Problem: Clinical Measurements: Goal: Respiratory complications will improve Outcome: Progressing   Problem: Clinical Measurements: Goal: Cardiovascular complication will be avoided Outcome: Progressing   Problem: Activity: Goal: Risk for activity intolerance will decrease Outcome: Progressing   Problem: Nutrition: Goal: Adequate nutrition will be maintained Outcome: Progressing   Problem: Coping: Goal: Level of anxiety will decrease Outcome: Progressing   Problem: Elimination: Goal: Will not experience complications related to bowel motility Outcome: Progressing   Problem: Pain Managment: Goal: General experience of comfort will improve Outcome: Progressing   Problem: Safety: Goal: Ability to remain free from injury will improve Outcome: Progressing   Problem: Skin Integrity: Goal: Risk for impaired skin integrity will decrease Outcome: Progressing

## 2023-07-10 NOTE — Progress Notes (Addendum)
Nephrology Follow-Up Consult note   Assessment/Recommendations: [name] is a/an 77 y.o. female with a past medical history significant for CKD V, CHF, GI bleeding/AVMs, DM2, afib, dementia, frailty, admitted for GI bleeding and ARF.       AKI on CKD V --> ESRD.  Poor candidate but after extensive discussions family wants to continue pursuing dialysis             - Continue HD on TTS schedule  -  ESRD CLIP; likely will require LTACH unless trach can be capped            - continue with AVG, working well  - start midodrine 10mg  with HD             - pal care involved   2.  Hyperkalemia: now resolved   3.  Anemia:             - suspect GI bleed on top of anemia of renal failure             - Transfusion per primary team  - GI with plans for push enteroscopy  - consider ESA   4.  BMM             - PTH 425, phos at goal  - ca corrects to normal when accounting for albumin   5.  Dementia:             - on Aricept             - agree with pall care involvement   6.  Dispo: floor, placement likely will require LTACH   Recommendations conveyed to primary service.    [name] [facility] 07/10/2023 9:49 AM  ___________________________________________________________  CC: weakness, AMS  Interval History/Subjective: Patient feels well with no complaints. Tolerated dialysis with no major issues except baseline hypotension   Medications:  Current Facility-Administered Medications  Medication Dose Route Frequency Provider Last Rate Last Admin   0.9 %  sodium chloride infusion (Manually program via Guardrails IV Fluids)   Intravenous Once Rondel Baton, MD       0.9 %  sodium chloride infusion (Manually program via Guardrails IV Fluids)   Intravenous Once Champ Mungo, DO       acetaminophen (TYLENOL) tablet 650 mg  650 mg Oral Q6H PRN Atway, [name], DO       Or   acetaminophen (TYLENOL) suppository 650 mg  650 mg Rectal Q6H PRN Atway,  [name], DO       albumin human 25 % solution 25 g  25 g Intravenous Once PRN [name], MD       [START ON 07/11/2023] apixaban (ELIQUIS) tablet 2.5 mg  2.5 mg Oral BID Atway, [name], DO       Chlorhexidine Gluconate Cloth 2 % PADS 6 each  6 each Topical Q0600 Bufford Buttner, MD   6 each at 07/10/23 0517   donepezil (ARICEPT) tablet 10 mg  10 mg Oral QHS Atway, [name], DO   10 mg at 07/09/23 2125   metoprolol succinate (TOPROL-XL) 24 hr tablet 12.5 mg  12.5 mg Oral Daily Atway, [name], DO   12.5 mg at 07/08/23 0806   [START ON 07/11/2023] midodrine (PROAMATINE) tablet 10 mg  10 mg Oral Q T,Th,Sa-HD [name], MD       mirtazapine (REMERON) tablet 7.5 mg  7.5 mg Oral QHS Atway, [name], DO   7.5 mg  at 07/09/23 2125   pantoprazole (PROTONIX) EC tablet 40 mg  40 mg Oral BID Atway, [name], DO       potassium chloride SA (KLOR-CON M) CR tablet 20 mEq  20 mEq Oral Once Atway, [name], DO       rosuvastatin (CRESTOR) tablet 40 mg  40 mg Oral QHS Atway, [name], DO   40 mg at 07/09/23 2125   senna-docusate (Senokot-S) tablet 1 tablet  1 tablet Oral QHS PRN Atway, [name], DO       sucralfate (CARAFATE) 1 GM/10ML suspension 1 g  1 g Oral TID AC & HS Atway, [name], DO   1 g at 07/10/23 0744      Review of Systems: 10 systems reviewed and negative except per interval history/subjective  Physical Exam: Vitals:   07/10/23 0813 07/10/23 0821  BP: (!) 99/55   Pulse: 63 69  Resp: 19 18  Temp: 97.8 F (36.6 C)   SpO2: 100% 98%   Total I/O In: 127.2 [P.O.:120; I.V.:7.2] Out: -   Intake/Output Summary (Last 24 hours) at 07/10/2023 0949 Last data filed at 07/10/2023 0800 Gross per 24 hour  Intake 542.17 ml  Output 0 ml  Net 542.17 ml   Constitutional: lying in bed, no acute distress ENMT: ears and nose without scars or lesions, MMM, trach in place  CV: normal rate, no edema Respiratory: coarse upper airway sounds, normal work of breathing Gastrointestinal: soft,  non-tender, no palpable masses or hernias Skin: no visible lesions or rashes Psych: alert, appropriate mood and affect   Test Results I personally reviewed new and old clinical labs and radiology tests Lab Results  Component Value Date   NA 134 (L) 07/10/2023   K 3.2 (L) 07/10/2023   CL 98 07/10/2023   CO2 26 07/10/2023   BUN 17 07/10/2023   CREATININE 2.76 (H) 07/10/2023   GFR 16.19 (L) 10/01/2021   GLU 96 07/28/2016   CALCIUM 7.8 (L) 07/10/2023   ALBUMIN 2.1 (L) 07/10/2023   PHOS 3.7 07/10/2023    CBC Recent Labs  Lab 07/07/23 1545 07/07/23 2031 07/08/23 0841 07/08/23 2017 07/09/23 0235 07/09/23 0800 07/10/23 0231  WBC 13.8*   < > 13.2*  --  10.6*  --  9.7  NEUTROABS 13.0*  --   --   --  9.2*  --   --   HGB 5.8*   < > 9.9*   < > 10.8* 9.9* 9.9*  HCT 22.9*   < > 31.2*   < > 33.5* 31.2* 31.7*  MCV 103.2*   < > 89.7  --  90.1  --  91.4  PLT 304   < > 197  --  137*  --  134*   < > = values in this interval not displayed.

## 2023-07-10 NOTE — Anesthesia Postprocedure Evaluation (Addendum)
Anesthesia Post Note  Patient: Sacheen Krumwiede  Procedure(s) Performed: ESOPHAGOGASTRODUODENOSCOPY (EGD) HOT HEMOSTASIS (ARGON PLASMA COAGULATION/BICAP) HEMOSTASIS CONTROL     Patient location during evaluation: Endoscopy Anesthesia Type: MAC Level of consciousness: awake and patient cooperative Pain management: pain level controlled Vital Signs Assessment: post-procedure vital signs reviewed and stable Respiratory status: spontaneous breathing, nonlabored ventilation, respiratory function stable and patient connected to tracheostomy mask oxygen Cardiovascular status: stable and blood pressure returned to baseline Postop Assessment: no apparent nausea or vomiting Anesthetic complications: no   No notable events documented.  Last Vitals:  Vitals:   07/10/23 0821 07/10/23 0900  BP:  (!) 91/55  Pulse: 69 86  Resp: 18 14  Temp:    SpO2: 98% 96%    Last Pain:  Vitals:   07/10/23 0821  TempSrc:   PainSc: 0-No pain                 Lyall Faciane

## 2023-07-10 NOTE — Evaluation (Signed)
Occupational Therapy Evaluation Patient Details Name: [name] MRN: [id number] DOB: 1946-07-29 Today's Date: 07/10/2023   History of Present Illness 77 y.o. female presents to [facility] hospital on 07/07/2023 with symptomatic anemia and FTT. Small bowel enteroscopy on 7/25. PMH includes afib, ESRD, COPD, CVA (residual expressive aphasia), HTN, gout, CHF, laryngeal stenosis s/p tracheostomy, CKDIII, chronic GIB, anemia, dementia.   Clinical Impression   This 77 yo female admitted with above presents to acute OT with PLOF (2-3 weeks ago) of getting around her home without an AD, sponge bathing, dressing and toileting herself; as well as being able to fix a sandwich and soup for lunch (microwave or stove top. She currently is very weak in her legs and has decreased sitting and standing balance thus affecting all mobility and ADLs as well as IADLs. She will continue to benefit from acute OT with follow up from intensive inpatient follow up therapy, >3 hours/day       Recommendations for follow up therapy are one component of a multi-disciplinary discharge planning process, led by the attending physician.  Recommendations may be updated based on patient status, additional functional criteria and insurance authorization.   Assistance Recommended at Discharge Frequent or constant Supervision/Assistance  Patient can return home with the following Two people to help with bathing/dressing/bathroom;Two people to help with walking and/or transfers;Assistance with cooking/housework;Help with stairs or ramp for entrance;Assist for transportation;Direct supervision/assist for financial management;Direct supervision/assist for medications management    Functional Status Assessment  Patient has had a recent decline in their functional status and demonstrates the ability to make significant improvements in function in a reasonable and predictable amount of time.  Equipment Recommendations  Other (comment) (TBD  next venue)       Precautions / Restrictions   Fall     Mobility Bed Mobility Overal bed mobility: Needs Assistance Bed Mobility: Supine to Sit, Sit to Supine     Supine to sit: Min assist, HOB elevated Sit to supine: Min assist        Transfers Overall transfer level: Needs assistance Equipment used: 1 person hand held assist Transfers: Sit to/from Stand Sit to Stand: Mod assist           General transfer comment: Mod A sit<>stand and maintain      Balance Overall balance assessment: Needs assistance Sitting-balance support: No upper extremity supported Sitting balance-Leahy Scale: Poor Sitting balance - Comments: left lateral lean   Standing balance support: Bilateral upper extremity supported Standing balance-Leahy Scale: Poor                             ADL either performed or assessed with clinical judgement   ADL Overall ADL's : Needs assistance/impaired Eating/Feeding: Set up;Supervision/ safety Eating/Feeding Details (indicate cue type and reason): supported sitting Grooming: Supervision/safety;Set up;Sitting Grooming Details (indicate cue type and reason): supported sitting Upper Body Bathing: Set up;Supervision/ safety Upper Body Bathing Details (indicate cue type and reason): supported sitting Lower Body Bathing: Maximal assistance Lower Body Bathing Details (indicate cue type and reason): Mod A sit<>stand and maintain Upper Body Dressing : Minimal assistance Upper Body Dressing Details (indicate cue type and reason): supported sitting Lower Body Dressing: Maximal assistance Lower Body Dressing Details (indicate cue type and reason): Mod A sit<>stand and maintain   Toilet Transfer Details (indicate cue type and reason): unable to take steps to go up towards [facility] Toileting- Clothing Manipulation and Hygiene: Total assistance Toileting - Clothing Manipulation Details (  indicate cue type and reason): Mod A sit<>stand and maintain              Vision Baseline Vision/History: 1 Wears glasses Patient Visual Report: No change from baseline              Pertinent Vitals/Pain Pain Assessment Pain Assessment:  (grimaced a couple of times but could not tell me where)     Hand Dominance Right   Extremity/Trunk Assessment Upper Extremity Assessment Upper Extremity Assessment: Overall WFL for tasks assessed           Communication Communication Communication: Expressive difficulties   Cognition Arousal/Alertness: Awake/alert Behavior During Therapy: WFL for tasks assessed/performed Overall Cognitive Status: History of cognitive impairments - at baseline                                                  Home Living Family/patient expects to be discharged to:: Private residence Living Arrangements: Children Available Help at Discharge: Family;Available PRN/intermittently Type of Home: House Home Access: Level entry     Home Layout: One level     Bathroom Shower/Tub: Tub/shower unit;Curtain   Firefighter: Standard     Home Equipment: Production designer, theatre/television/film (4 wheels);Cane - quad          Prior Functioning/Environment Prior Level of Function : Independent/Modified Independent             Mobility Comments: Used QC when out and about, but nothing in house up until 2 weeks ago ADLs Comments: Was able to sponge bath, dress herself, toilet herself and fix a sandwich/soup for herself up until 2 weeks ago.        OT Problem List: Decreased strength;Impaired balance (sitting and/or standing);Decreased cognition      OT Treatment/Interventions: Self-care/ADL training;DME and/or AE instruction;Balance training;Patient/family education    OT Goals(Current goals can be found in the care plan section) Acute Rehab OT Goals Patient Stated Goal: willing to work with therapy OT Goal Formulation: With patient/family Time For Goal Achievement: 07/24/23 Potential to Achieve  Goals: Good  OT Frequency: Min 1X/week       AM-PAC OT "6 Clicks" Daily Activity     Outcome Measure Help from another person eating meals?: A Little Help from another person taking care of personal grooming?: A Little Help from another person toileting, which includes using toliet, bedpan, or urinal?: A Lot Help from another person bathing (including washing, rinsing, drying)?: A Lot Help from another person to put on and taking off regular upper body clothing?: A Little Help from another person to put on and taking off regular lower body clothing?: Total 6 Click Score: 14   End of Session Equipment Utilized During Treatment: Gait belt  Activity Tolerance: Patient tolerated treatment well Patient left: in bed;with call bell/phone within reach;with bed alarm set  OT Visit Diagnosis: Other abnormalities of gait and mobility (R26.89);Muscle weakness (generalized) (M62.81);Other symptoms and signs involving cognitive function                Time: 1046-1100 OT Time Calculation (min): 14 min Charges:  OT General Charges $OT Visit: 1 Visit OT Evaluation $OT Eval Moderate Complexity: 1 Mod  .Lindon Romp OT Acute Rehabilitation Services Office [phone]    Evette Georges 07/10/2023, 12:18 PM

## 2023-07-10 NOTE — Progress Notes (Signed)
Inpatient Rehab Admissions Coordinator:   Per therapy recommendations pt was screened for CIR by Estill Dooms, PT, DPT.  Note admitted for anemia/FTT/debility.  Pt's insurance unlikely to approve CIR admission for this diagnosis.  Would recommend TOC f/u for options.   Estill Dooms, PT, DPT Admissions Coordinator [phone] 07/10/23  4:47 PM

## 2023-07-10 NOTE — Progress Notes (Signed)
Initial Nutrition Assessment  DOCUMENTATION CODES:   Underweight, Severe malnutrition in context of chronic illness  INTERVENTION:   Vanilla or strawberry Ensure Plus High Protein po BID, each supplement provides 350 kcal and 20 grams of protein.  Magic cup BID with meals, each supplement provides 290 kcal and 9 grams of protein (no chocolate)   Continue full liquid diet and advance as tolerated/physician discretion   NUTRITION DIAGNOSIS:   Severe Malnutrition related to chronic illness as evidenced by severe fat depletion, severe muscle depletion.   GOAL:   Patient will meet greater than or equal to 90% of their needs   MONITOR:   PO intake, Supplement acceptance, Labs, Weight trends, Skin, I & O's  REASON FOR ASSESSMENT:   Malnutrition Screening Tool    ASSESSMENT:   77 y.o. female with PMHx including a-fib, ESRD, COPD, stroke, CHF, laryngeal stenosis s/p trach, and recurrent GI bleeds 2/2 intestinal AVM's was admitted with anemia and melenic stool  S/p EGD- AVMs all through GI tract  Visited patient at bedside who mouthed her words due to trach. She reports poor appetite and that chewing and swallowing have been difficult but she is able to tolerate ice cream and liquids. Patient is agreeable to ONS   Per MD notes, patient working towards decannulation of trach. Patient also in ESRD and is a poor candidate for dialysis, however, family still wishes to pursue it.   GOC ongoing, palliative following   Labs: Na 134, K+ 3.2, Cr 2.76, iron 13 Meds:  Wt: 4.9kg (9%) wt loss x 1 month  07/09/23 48.8 kg  06/25/23 50.3 kg  06/02/23 53.7 kg  05/29/23 53.7 kg  05/26/23 51.7 kg  05/21/23 53.5 kg   PO: no meals documented at this time  I/O's: +2.1 L   NUTRITION - FOCUSED PHYSICAL EXAM:  Flowsheet Row Most Recent Value  Orbital Region Severe depletion  Upper Arm Region Severe depletion  Thoracic and Lumbar Region Severe depletion  Buccal Region Severe depletion   Temple Region Severe depletion  Clavicle Bone Region Severe depletion  Clavicle and Acromion Bone Region Severe depletion  Scapular Bone Region Unable to assess  Dorsal Hand Severe depletion  Patellar Region Severe depletion  Anterior Thigh Region Severe depletion  Posterior Calf Region Severe depletion  Edema (RD Assessment) None  Hair Reviewed  Eyes Reviewed  Mouth Reviewed  Skin Reviewed  Nails Reviewed       Diet Order:   Diet Order             Diet full liquid Fluid consistency: Thin  Diet effective now                   EDUCATION NEEDS:   No education needs have been identified at this time  Skin:  Skin Assessment: Reviewed RN Assessment  Last BM:  7/26 type 3  Height:   Ht Readings from Last 1 Encounters:  06/25/23 5\' 5"  (1.651 m)    Weight:   Wt Readings from Last 1 Encounters:  07/09/23 48.8 kg    Ideal Body Weight:     BMI:  Body mass index is 17.9 kg/m.  Estimated Nutritional Needs:   Kcal:  1470-1715  Protein:  60-75 g  Fluid:  > 1.7L    Leodis Rains, RDN, LDN  Clinical Nutrition

## 2023-07-10 NOTE — Evaluation (Signed)
Physical Therapy Evaluation Patient Details Name: [name] MRN: [id number] DOB: [birth year] Today's Date: 07/10/2023  History of Present Illness  77 y.o. female presents to [facility] hospital on 07/07/2023 with symptomatic anemia and FTT. Small bowel enteroscopy on 7/25. PMH includes afib, ESRD, COPD, CVA, CHF, laryngeal stenosis s/p tracheostomy, chronic GIB, dementia.  Clinical Impression  Pt presents to PT with deficits in functional mobility, strength, power, gait, balance, endurance. Pt is generally weak and requires physical assistance to perform all functional mobility tasks at this time. Pt requires lifting to obtain standing and is only able to stand for brief periods. Pt was able to ambulate independently prior to decline 2 weeks ago. PT recommends high intensity inpatient PT services at the time of discharge in an effort to return to the pt's prior level of function.      Assistance Recommended at Discharge Frequent or constant Supervision/Assistance  If plan is discharge home, recommend the following:  Can travel by private vehicle  A lot of help with walking and/or transfers;A lot of help with bathing/dressing/bathroom;Assistance with cooking/housework;Assist for transportation;Help with stairs or ramp for entrance;Direct supervision/assist for medications management;Direct supervision/assist for financial management        Equipment Recommendations  (TBD)  Recommendations for Other Services  Rehab consult    Functional Status Assessment Patient has had a recent decline in their functional status and demonstrates the ability to make significant improvements in function in a reasonable and predictable amount of time.     Precautions / Restrictions Precautions Precautions: Fall Precaution Comments: trach Restrictions Weight Bearing Restrictions: No      Mobility  Bed Mobility Overal bed mobility: Needs Assistance Bed Mobility: Supine to Sit, Sit to Supine      Supine to sit: Min assist, HOB elevated Sit to supine: Min assist        Transfers Overall transfer level: Needs assistance Equipment used: Rolling walker (2 wheels), 1 person hand held assist Transfers: Sit to/from Stand Sit to Stand: Mod assist           General transfer comment: pt stands 4 times during session, poor tolerance for standing    Ambulation/Gait                  Stairs            Wheelchair Mobility     Tilt Bed    Modified Rankin (Stroke Patients Only)       Balance Overall balance assessment: Needs assistance Sitting-balance support: No upper extremity supported, Feet supported Sitting balance-Leahy Scale: Fair     Standing balance support: Bilateral upper extremity supported, Reliant on assistive device for balance Standing balance-Leahy Scale: Poor                               Pertinent Vitals/Pain Pain Assessment Pain Assessment: Faces Faces Pain Scale: Hurts little more Pain Location: trunk w/ PT assist during transfers Pain Descriptors / Indicators: Grimacing Pain Intervention(s): Monitored during session    Home Living Family/patient expects to be discharged to:: Private residence Living Arrangements: Children Available Help at Discharge: Family;Available PRN/intermittently Type of Home: House Home Access: Level entry       Home Layout: One level Home Equipment: BSC/3in1;Shower seat;Rollator (4 wheels);Cane - quad      Prior Function Prior Level of Function : Independent/Modified Independent             Mobility Comments: Used QC when  out and about, but nothing in house up until 2 weeks ago ADLs Comments: Was able to sponge bath, dress herself, toilet herself and fix a sandwich/soup for herself up until 2 weeks ago.     Hand Dominance   Dominant Hand: Right    Extremity/Trunk Assessment   Upper Extremity Assessment Upper Extremity Assessment: Generalized weakness    Lower  Extremity Assessment Lower Extremity Assessment: Generalized weakness    Cervical / Trunk Assessment Cervical / Trunk Assessment: Kyphotic  Communication   Communication: Expressive difficulties  Cognition Arousal/Alertness: Awake/alert Behavior During Therapy: WFL for tasks assessed/performed Overall Cognitive Status: History of cognitive impairments - at baseline                                 General Comments: slowed processing, follows one-step commands well        General Comments General comments (skin integrity, edema, etc.): pt on 6L 28% FiO2 humidified air upon PT arrival, sats well when weaned to room air for mobility    Exercises     Assessment/Plan    PT Assessment Patient needs continued PT services  PT Problem List Decreased strength;Decreased activity tolerance;Decreased mobility;Decreased balance;Decreased knowledge of use of DME;Decreased safety awareness;Decreased knowledge of precautions       PT Treatment Interventions DME instruction;Gait training;Functional mobility training;Therapeutic activities;Therapeutic exercise;Balance training;Neuromuscular re-education;Patient/family education;Wheelchair mobility training    PT Goals (Current goals can be found in the Care Plan section)  Acute Rehab PT Goals Patient Stated Goal: to return to ambulation PT Goal Formulation: With patient Time For Goal Achievement: 07/24/23 Potential to Achieve Goals: Fair    Frequency Min 3X/week     Co-evaluation               AM-PAC PT "6 Clicks" Mobility  Outcome Measure Help needed turning from your back to your side while in a flat bed without using bedrails?: A Little Help needed moving from lying on your back to sitting on the side of a flat bed without using bedrails?: A Little Help needed moving to and from a bed to a chair (including a wheelchair)?: A Lot Help needed standing up from a chair using your arms (e.g., wheelchair or bedside  chair)?: A Lot Help needed to walk in hospital room?: Total Help needed climbing 3-5 steps with a railing? : Total 6 Click Score: 12    End of Session   Activity Tolerance: Patient limited by fatigue Patient left: in bed;with call bell/phone within reach;with bed alarm set Nurse Communication: Mobility status PT Visit Diagnosis: Other abnormalities of gait and mobility (R26.89);Muscle weakness (generalized) (M62.81)    Time: 4403-4742 PT Time Calculation (min) (ACUTE ONLY): 15 min   Charges:   PT Evaluation $PT Eval Low Complexity: 1 Low   PT General Charges $$ ACUTE PT VISIT: 1 Visit         Arlyss Gandy, PT, DPT Acute Rehabilitation Office [phone]   Arlyss Gandy 07/10/2023, 2:46 PM

## 2023-07-11 LAB — CBC
HCT: 29.2 % — ABNORMAL LOW (ref 36.0–46.0)
Hemoglobin: 9 g/dL — ABNORMAL LOW (ref 12.0–15.0)
MCH: 28.3 pg (ref 26.0–34.0)
MCHC: 30.8 g/dL (ref 30.0–36.0)
MCV: 91.8 fL (ref 80.0–100.0)
Platelets: 125 10*3/uL — ABNORMAL LOW (ref 150–400)
RBC: 3.18 MIL/uL — ABNORMAL LOW (ref 3.87–5.11)
RDW: 15.8 % — ABNORMAL HIGH (ref 11.5–15.5)
WBC: 8.2 10*3/uL (ref 4.0–10.5)
nRBC: 0 % (ref 0.0–0.2)

## 2023-07-11 MED ORDER — POTASSIUM CHLORIDE 10 MEQ/100ML IV SOLN
10.0000 meq | INTRAVENOUS | Status: AC
  Administered 2023-07-11 (×3): 10 meq via INTRAVENOUS
  Filled 2023-07-11 (×3): qty 100

## 2023-07-11 MED ORDER — ALBUMIN HUMAN 25 % IV SOLN
INTRAVENOUS | Status: AC
  Filled 2023-07-11: qty 100

## 2023-07-11 NOTE — Plan of Care (Signed)
?  Problem: Clinical Measurements: ?Goal: Will remain free from infection ?Outcome: Progressing ?Goal: Diagnostic test results will improve ?Outcome: Progressing ?Goal: Respiratory complications will improve ?Outcome: Progressing ?  ?

## 2023-07-11 NOTE — Progress Notes (Signed)
   07/11/23 1809  Vitals  Temp 97.9 F (36.6 C)  Pulse Rate 68  Resp 20  BP 114/66  SpO2 97 %  O2 Device Room Air  Weight 51.1 kg  Type of Weight Post-Dialysis  Oxygen Therapy  Patient Activity (if Appropriate) In bed  Pulse Oximetry Type Continuous  Oximetry Probe Site Changed No  Post Treatment  Dialyzer Clearance Lightly streaked  Duration of HD Treatment -hour(s) 3.3 hour(s)  Hemodialysis Intake (mL) 0 mL  Liters Processed 84  Fluid Removed (mL) 0.5 mL  Tolerated HD Treatment Yes  AVG/AVF Arterial Site Held (minutes) 10 minutes  AVG/AVF Venous Site Held (minutes) 10 minutes   Received patient in bed to unit.  Alert and oriented.  Informed consent signed and in chart.   TX duration:3.5  Patient tolerated well.  Transported back to the room  Alert, without acute distress.  Hand-off given to patient's nurse.   Access used: LUAF Access issues: no complications  Total UF removed: Medication(s) given: none   [facility]

## 2023-07-11 NOTE — Progress Notes (Signed)
Nephrology Follow-Up Consult note   Assessment/Recommendations: [name] is a/an 77 y.o. female with a past medical history significant for CKD V, CHF, GI bleeding/AVMs, DM2, afib, dementia, frailty, admitted for GI bleeding and ARF.       AKI on CKD V --> ESRD.  Poor candidate but after extensive discussions family wants to continue pursuing dialysis             - Continue HD on TTS schedule  -  ESRD CLIP; dependent on trach status            - continue with AVG, working well  -  midodrine 10mg  with HD             - pal care involved   2.  Hyperkalemia: now resolved   3.  Anemia:             - suspect GI bleed on top of anemia of renal failure  - s/p push enteroscopy             - Transfusion per primary team  - consider ESA   4.  BMM             - PTH 425, phos at goal  - ca corrects to normal when accounting for albumin   5.  Dementia:             - on Aricept             - pal care involved   6.  Dispo: floor, trying to cap and potentially remove trach to aid in placement   Recommendations conveyed to primary service.    [name] [facility] 07/11/2023 8:45 AM  ___________________________________________________________  CC: weakness, AMS  Interval History/Subjective: Patient resting with no complaints   Medications:  Current Facility-Administered Medications  Medication Dose Route Frequency Provider Last Rate Last Admin   0.9 %  sodium chloride infusion (Manually program via Guardrails IV Fluids)   Intravenous Once Rondel Baton, MD       0.9 %  sodium chloride infusion (Manually program via Guardrails IV Fluids)   Intravenous Once Champ Mungo, DO       acetaminophen (TYLENOL) tablet 650 mg  650 mg Oral Q6H PRN Atway, [name], DO       Or   acetaminophen (TYLENOL) suppository 650 mg  650 mg Rectal Q6H PRN Atway, [name], DO       albumin human 25 % solution 25 g  25 g Intravenous Once PRN [name], MD        apixaban Everlene Balls) tablet 2.5 mg  2.5 mg Oral BID Atway, [name], DO       Chlorhexidine Gluconate Cloth 2 % PADS 6 each  6 each Topical Q0600 Bufford Buttner, MD   6 each at 07/11/23 0614   donepezil (ARICEPT) tablet 10 mg  10 mg Oral QHS Atway, [name], DO   10 mg at 07/10/23 2117   feeding supplement (ENSURE ENLIVE / ENSURE PLUS) liquid 237 mL  237 mL Oral BID BM Dickie La, MD   237 mL at 07/10/23 1649   metoprolol succinate (TOPROL-XL) 24 hr tablet 12.5 mg  12.5 mg Oral Daily Atway, [name], DO   12.5 mg at 07/08/23 0806   midodrine (PROAMATINE) tablet 10 mg  10 mg Oral Q T,Th,Sa-HD [name], MD       mirtazapine (REMERON) tablet 7.5 mg  7.5 mg Oral QHS  Chauncey Mann, DO   7.5 mg at 07/09/23 2125   pantoprazole (PROTONIX) EC tablet 40 mg  40 mg Oral BID Atway, [name], DO   40 mg at 07/10/23 2117   potassium chloride 10 mEq in 100 mL IVPB  10 mEq Intravenous Q1 Hr x 3 Ellen-Savino, [place], MD 100 mL/hr at 07/11/23 0815 10 mEq at 07/11/23 0815   rosuvastatin (CRESTOR) tablet 40 mg  40 mg Oral QHS Atway, [name], DO   40 mg at 07/10/23 2117   senna-docusate (Senokot-S) tablet 1 tablet  1 tablet Oral QHS PRN Atway, [name], DO       sucralfate (CARAFATE) 1 GM/10ML suspension 1 g  1 g Oral TID AC & HS Atway, [name], DO   1 g at 07/11/23 1914      Review of Systems: 10 systems reviewed and negative except per interval history/subjective  Physical Exam: Vitals:   07/11/23 0000 07/11/23 0615  BP:  (!) 102/52  Pulse:  70  Resp: 20 18  Temp:  98.2 F (36.8 C)  SpO2: 100% 96%   No intake/output data recorded.  Intake/Output Summary (Last 24 hours) at 07/11/2023 0845 Last data filed at 07/11/2023 7829 Gross per 24 hour  Intake 616.14 ml  Output --  Net 616.14 ml   Constitutional: lying in bed, no acute distress ENMT: ears and nose without scars or lesions, MMM, trach in place  CV: normal rate, no edema Respiratory: coarse upper airway sounds, normal work of  breathing Gastrointestinal: soft, non-tender, no palpable masses or hernias Skin: no visible lesions or rashes   Test Results I personally reviewed new and old clinical labs and radiology tests Lab Results  Component Value Date   NA 135 07/11/2023   K 3.3 (L) 07/11/2023   CL 95 (L) 07/11/2023   CO2 24 07/11/2023   BUN 28 (H) 07/11/2023   CREATININE 4.22 (H) 07/11/2023   GFR 16.19 (L) 10/01/2021   GLU 96 07/28/2016   CALCIUM 8.2 (L) 07/11/2023   ALBUMIN 2.1 (L) 07/11/2023   PHOS 4.6 07/11/2023    CBC Recent Labs  Lab 07/07/23 1545 07/07/23 2031 07/08/23 0841 07/08/23 2017 07/09/23 0235 07/09/23 0800 07/10/23 0231  WBC 13.8*   < > 13.2*  --  10.6*  --  9.7  NEUTROABS 13.0*  --   --   --  9.2*  --   --   HGB 5.8*   < > 9.9*   < > 10.8* 9.9* 9.9*  HCT 22.9*   < > 31.2*   < > 33.5* 31.2* 31.7*  MCV 103.2*   < > 89.7  --  90.1  --  91.4  PLT 304   < > 197  --  137*  --  134*   < > = values in this interval not displayed.

## 2023-07-11 NOTE — Plan of Care (Signed)
  Problem: Activity: Goal: Risk for activity intolerance will decrease Outcome: Progressing   Problem: Coping: Goal: Level of anxiety will decrease Outcome: Progressing   Problem: Elimination: Goal: Will not experience complications related to bowel motility Outcome: Progressing Goal: Will not experience complications related to urinary retention Outcome: Progressing   Problem: Pain Managment: Goal: General experience of comfort will improve Outcome: Progressing   Problem: Safety: Goal: Ability to remain free from injury will improve Outcome: Progressing   

## 2023-07-11 NOTE — Progress Notes (Addendum)
Subjective: [name] is a 77 year-old female who has a PMHx of of complete heart block s/p Abbott PPM in 1986, atrial fibrillation, HTN, aortic atherosclerosis, laryngeal stenosis s/p tracheostomy, CVA 04/2019, GI bleed 2012, AVMs, HCM/chronic congestive heart failure, ESRD s/p fistula, COPD who presented with decreased PO intake and melena found to have acute on chronic anemia and GI bleed   Tracheostomy valve closed. Some difficulty with breathing. Denies any CP, abd pain, dizziness. Scheduled for dialysis later today    Objective: Vital signs in last 24 hours: Vitals:   07/10/23 1945 07/10/23 2100 07/11/23 0000 07/11/23 0615  BP:  (!) 95/55  (!) 102/52  Pulse:  70  70  Resp:  18 20 18   Temp:  98.1 F (36.7 C)  98.2 F (36.8 C)  TempSrc:  Axillary  Axillary  SpO2: 100% 100% 100% 96%  Weight:       Weight change:   Intake/Output Summary (Last 24 hours) at 07/11/2023 5643 Last data filed at 07/11/2023 3295 Gross per 24 hour  Intake 616.14 ml  Output --  Net 616.14 ml    Physical Exam: General appearance: frail, awake and alert, NAD Eyes: conjunctivae/corneas clear. PERRL, EOM's intact Neck:  tracheostomy valve closed  Lungs: normal WOB, CTAB  Heart: RRR, S1, S2 normal, no m/r/g Abdomen: soft, NTND Extremities: sarcopenic, no LE edema. LUE AV graft fistula in place Neurologic: oriented to self and place, not to time    Lab Results:    Latest Ref Rng & Units 07/10/2023    2:31 AM 07/09/2023    8:00 AM 07/09/2023    2:35 AM  CBC  WBC 4.0 - 10.5 K/uL 9.7   10.6   Hemoglobin 12.0 - 15.0 g/dL 9.9  9.9  18.8   Hematocrit 36.0 - 46.0 % 31.7  31.2  33.5   Platelets 150 - 400 K/uL 134   137       Latest Ref Rng & Units 07/11/2023    2:57 AM 07/10/2023    2:31 AM 07/09/2023    2:33 AM  CMP  Glucose 70 - 99 mg/dL 416  94  75   BUN 8 - 23 mg/dL 28  17  28    Creatinine 0.44 - 1.00 mg/dL 6.06  3.01  6.01   Sodium 135 - 145 mmol/L 135  134  135   Potassium 3.5 - 5.1  mmol/L 3.3  3.2  3.7   Chloride 98 - 111 mmol/L 95  98  97   CO2 22 - 32 mmol/L 24  26  22    Calcium 8.9 - 10.3 mg/dL 8.2  7.8  8.2    Recent Labs    07/08/23 2059 07/09/23 2030  GLUCAP 80 147*    Micro Results: No results found for this or any previous visit (from the past 240 hour(s)).  Studies/Results: No results found.  Medications: I have reviewed the patient's current medications. Scheduled Meds:  sodium chloride   Intravenous Once   sodium chloride   Intravenous Once   apixaban  2.5 mg Oral BID   Chlorhexidine Gluconate Cloth  6 each Topical Q0600   donepezil  10 mg Oral QHS   feeding supplement  237 mL Oral BID BM   metoprolol succinate  12.5 mg Oral Daily   midodrine  10 mg Oral Q T,Th,Sa-HD   mirtazapine  7.5 mg Oral QHS   pantoprazole  40 mg Oral BID   rosuvastatin  40 mg Oral QHS  sucralfate  1 g Oral TID AC & HS   Continuous Infusions:  albumin human     potassium chloride 10 mEq (07/11/23 0815)   PRN Meds:.acetaminophen **OR** acetaminophen, albumin human, senna-docusate  Assessment/Plan: Principal Problem:   Acute on chronic anemia Active Problems:   Current use of long term anticoagulation   Tracheostomy present (HCC)   Melena   ESRD on dialysis (HCC)   Acute encephalopathy   Gastrointestinal hemorrhage   AVM (arteriovenous malformation) of duodenum, acquired with hemorrhage   AVM (arteriovenous malformation) of stomach, acquired with hemorrhage   Protein-calorie malnutrition, severe   Discharge planning issues  [name] is a 77 year-old female who has a PMHx of of complete heart block s/p Abbott PPM in 1986, atrial fibrillation, HTN, aortic atherosclerosis, laryngeal stenosis s/p tracheostomy, CVA 04/2019, GI bleed 2012, AVMs, HCM/chronic congestive heart failure, ESRD s/p fistula, COPD who presented with decreased PO intake and melena found to have acute on chronic anemia and GI bleed   Active Problems   #Melena  H/o small bowel  AVMs #Decreased PO intake #Acute on chronic anemia, stable   Hx of chronic anemia in the setting of ESRD, who presented with melena c/f acute blood loss. Also has hx of GI bleed in 2012 and reported small bowel AVMs in 2022 treated with APC. Colonoscopy in 2021 showed multiple polyps in colon that were resected. Iron levels low at 38, Tsat 4%, with ferritin 76. Enteroscopy on 7/25 showed normal appearing esophagus, 3-4 angiodysplastic lesions located at the stomach, and 2 lesions at duodenum which were all treated with APC  -GI following, appreciate recs, deemed appropriate for discharge  -Cont PO protonix 40mg  BID  -Cont Carafate TID for 2 weeks  -Restart Eliquis 2.5mg  BID  -Consider IV iron replacement   #ESRD s/p AV graft fistula #Hyperkalemia, resolved   #Uremia  Left UE AV graft placement on 06/01/23. Unclear her baseline Cr but likely ~2-3. Etiology for her renal failure unknown but suspected to be secondary to HTN. Completed 3 of 3 initiation dialysis sessions. PTH levels elevated. Hemodialysis schedule T/Th/Sat. HbsAb noted to show incomplete immunity. HBsAg non-reactive  -Nephro following, planning for HD likely 7/27  -Recommended to start midodrine 10mg  when at HD -Recommended obtaining HBV vaccine for ongoing dialysis  -Daily BMP    #Dementia  Reported onset of dementia in 2021. Previously able to perform ADLs however over the last few weeks patient has deviated from her baseline and requires the support of her family members to care for her. She is now oriented to person and place. BUN significantly elevated on initial exam likely worsening her mental status.  -Continue home donepezil 10mg  daily   -Palliative care consulted for goals of care  -PT/OT following   #Trach collar Trach collar is capped - no longer requiring supplemental O2. Plan was for outpatient decannulation, given that trach will be a barrier to getting outpatient dialysis. We have consulted her ENT to discuss  this, while she is admitted.  -Follow-up with ENT and social work     Chronic Problems   #Atrial fibrillation #HLD #H/o stroke Reported stroke on 04/2019 with residual expressive aphasia. Afib has since been rate controlled with Metoprolol succinate 12.5 daily. Last lipid panel showed LDL 55 (05/29/23) -Cont home rosuvastatin 40mg   -Continue metoprolol succinate 12.5mg  daily   -Start Eliquis 2.5mg  BID    #Chronic CHF  HCM  #Complete heart block s/p PPM (1986)  Follows with HF clinic. Home GDMT incldues Metoprolol  succinate 12.5mg  daily, Torsemide 40mg  every day. Last ECHO 09/2021 LVEF 40-45%   -Continue Metoprolol succinate  -Hold diuretic iso dialysis. May consider diuretic on non-dialysis days based on volume status      #HTN: Well-controlled at home -Continue antihypertensives as stated above   This is a Psychologist, occupational Note.  The care of the patient was discussed with Dr. Ned Card and the assessment and plan formulated with their assistance.  Please see their attached note for official documentation of the daily encounter.   LOS: 4 days   Loyal Buba 07/11/2023, 8:33 AM   Attestation for Student Documentation:  I personally was present and performed or re-performed the history, physical exam and medical decision-making activities of this service and have verified that the service and findings are accurately documented in the student's note.  Chauncey Mann, DO 07/11/2023, 11:07 AM

## 2023-07-12 DIAGNOSIS — N186 End stage renal disease: Secondary | ICD-10-CM | POA: Diagnosis not present

## 2023-07-12 DIAGNOSIS — R627 Adult failure to thrive: Secondary | ICD-10-CM | POA: Diagnosis present

## 2023-07-12 DIAGNOSIS — Z992 Dependence on renal dialysis: Secondary | ICD-10-CM | POA: Diagnosis not present

## 2023-07-12 DIAGNOSIS — D649 Anemia, unspecified: Secondary | ICD-10-CM | POA: Diagnosis not present

## 2023-07-12 LAB — RENAL FUNCTION PANEL
Albumin: 2 g/dL — ABNORMAL LOW (ref 3.5–5.0)
Anion gap: 12 (ref 5–15)
BUN: 11 mg/dL (ref 8–23)
CO2: 27 mmol/L (ref 22–32)
Calcium: 8 mg/dL — ABNORMAL LOW (ref 8.9–10.3)
Chloride: 96 mmol/L — ABNORMAL LOW (ref 98–111)
Creatinine, Ser: 2.45 mg/dL — ABNORMAL HIGH (ref 0.44–1.00)
GFR, Estimated: 20 mL/min — ABNORMAL LOW (ref >60)
Glucose, Bld: 113 mg/dL — ABNORMAL HIGH (ref 70–99)
Phosphorus: 1.9 mg/dL — ABNORMAL LOW (ref 2.5–4.6)
Potassium: 3.4 mmol/L — ABNORMAL LOW (ref 3.5–5.1)
Sodium: 135 mmol/L (ref 135–145)

## 2023-07-12 LAB — CBC
HCT: 30.7 % — ABNORMAL LOW (ref 36.0–46.0)
Hemoglobin: 9.2 g/dL — ABNORMAL LOW (ref 12.0–15.0)
MCH: 27.3 pg (ref 26.0–34.0)
MCHC: 30 g/dL (ref 30.0–36.0)
MCV: 91.1 fL (ref 80.0–100.0)
Platelets: 132 10*3/uL — ABNORMAL LOW (ref 150–400)
RBC: 3.37 MIL/uL — ABNORMAL LOW (ref 3.87–5.11)
RDW: 15.7 % — ABNORMAL HIGH (ref 11.5–15.5)
WBC: 7.6 10*3/uL (ref 4.0–10.5)
nRBC: 0 % (ref 0.0–0.2)

## 2023-07-12 MED ORDER — DARBEPOETIN ALFA 60 MCG/0.3ML IJ SOSY
60.0000 ug | PREFILLED_SYRINGE | INTRAMUSCULAR | Status: DC
  Administered 2023-07-14 – 2023-07-21 (×2): 60 ug via SUBCUTANEOUS
  Filled 2023-07-12 (×2): qty 0.3

## 2023-07-12 NOTE — Progress Notes (Addendum)
Nephrology Follow-Up Consult note   Assessment/Recommendations: [name] is a/an 77 y.o. female with a past medical history significant for CKD V, CHF, GI bleeding/AVMs, DM2, afib, dementia, frailty, admitted for GI bleeding and ARF.       AKI on CKD V --> ESRD.  Family wants to pursue dialysis             - Continue HD on TTS schedule  -  ESRD CLIP; dependent on trach status; working towards decannulation            - continue with AVG, working well  -  midodrine 10mg  with HD             - pal care involved   2.  Hyperkalemia: now resolved   3.  Anemia:             - suspect GI bleed on top of anemia of renal failure  - s/p push enteroscopy             - Transfusion per primary team  - Start esa on Tuesday   4.  BMM             - PTH 425, phos at goal  - ca corrects to normal when accounting for albumin   5.  Dementia:             - on Aricept             - pal care involved   6.  Dispo: floor, trying to cap and potentially remove trach to aid in placement  7. Nutrition: albumin 2. Push protein   Recommendations conveyed to primary service.    [name] [facility] 07/12/2023 8:52 AM  ___________________________________________________________  CC: weakness, AMS  Interval History/Subjective: Patient states she feels well today.  Denies any complaints.  Tolerated dialysis yesterday with no issues.   Medications:  Current Facility-Administered Medications  Medication Dose Route Frequency Provider Last Rate Last Admin   0.9 %  sodium chloride infusion (Manually program via Guardrails IV Fluids)   Intravenous Once Rondel Baton, MD       0.9 %  sodium chloride infusion (Manually program via Guardrails IV Fluids)   Intravenous Once Champ Mungo, DO       acetaminophen (TYLENOL) tablet 650 mg  650 mg Oral Q6H PRN Atway, [name], DO       Or   acetaminophen (TYLENOL) suppository 650 mg  650 mg Rectal Q6H PRN Atway, [name], DO        albumin human 25 % solution 25 g  25 g Intravenous Once PRN [name], MD       apixaban Everlene Balls) tablet 2.5 mg  2.5 mg Oral BID Atway, [name], DO   2.5 mg at 07/11/23 2132   Chlorhexidine Gluconate Cloth 2 % PADS 6 each  6 each Topical Q0600 Bufford Buttner, MD   6 each at 07/12/23 0613   donepezil (ARICEPT) tablet 10 mg  10 mg Oral QHS Atway, [name], DO   10 mg at 07/11/23 2133   feeding supplement (ENSURE ENLIVE / ENSURE PLUS) liquid 237 mL  237 mL Oral BID BM Dickie La, MD   237 mL at 07/10/23 1649   metoprolol succinate (TOPROL-XL) 24 hr tablet 12.5 mg  12.5 mg Oral Daily Atway, [name], DO   12.5 mg at 07/11/23 0920   midodrine (PROAMATINE) tablet 10 mg  10 mg Oral Q T,Th,Sa-HD  [name], MD   10 mg at 07/11/23 1403   mirtazapine (REMERON) tablet 7.5 mg  7.5 mg Oral QHS Atway, [name], DO   7.5 mg at 07/11/23 2132   pantoprazole (PROTONIX) EC tablet 40 mg  40 mg Oral BID Atway, [name], DO   40 mg at 07/11/23 2132   rosuvastatin (CRESTOR) tablet 40 mg  40 mg Oral QHS Atway, [name], DO   40 mg at 07/11/23 2132   senna-docusate (Senokot-S) tablet 1 tablet  1 tablet Oral QHS PRN Atway, [name], DO       sucralfate (CARAFATE) 1 GM/10ML suspension 1 g  1 g Oral TID AC & HS Atway, [name], DO   1 g at 07/11/23 2132      Review of Systems: 10 systems reviewed and negative except per interval history/subjective  Physical Exam: Vitals:   07/12/23 0614 07/12/23 0825  BP: 94/70 (!) 99/59  Pulse: 70 69  Resp: 16 18  Temp: (!) 97.4 F (36.3 C) 98.1 F (36.7 C)  SpO2: 100% 100%   No intake/output data recorded.  Intake/Output Summary (Last 24 hours) at 07/12/2023 9629 Last data filed at 07/11/2023 2100 Gross per 24 hour  Intake 120 ml  Output 0.5 ml  Net 119.5 ml   Constitutional: Sitting up in bed, no acute distress ENMT: ears and nose without scars or lesions, MMM, trach in place  CV: normal rate, no edema Respiratory: Bilateral chest rise with no  increased work of breathing Gastrointestinal: soft, non-tender, no palpable masses or hernias Skin: no visible lesions or rashes   Test Results I personally reviewed new and old clinical labs and radiology tests Lab Results  Component Value Date   NA 135 07/12/2023   K 3.4 (L) 07/12/2023   CL 96 (L) 07/12/2023   CO2 27 07/12/2023   BUN 11 07/12/2023   CREATININE 2.45 (H) 07/12/2023   GFR 16.19 (L) 10/01/2021   GLU 96 07/28/2016   CALCIUM 8.0 (L) 07/12/2023   ALBUMIN 2.0 (L) 07/12/2023   PHOS 1.9 (L) 07/12/2023    CBC Recent Labs  Lab 07/07/23 1545 07/07/23 2031 07/09/23 0235 07/09/23 0800 07/10/23 0231 07/11/23 1123 07/12/23 0040  WBC 13.8*   < > 10.6*  --  9.7 8.2 7.6  NEUTROABS 13.0*  --  9.2*  --   --   --   --   HGB 5.8*   < > 10.8*   < > 9.9* 9.0* 9.2*  HCT 22.9*   < > 33.5*   < > 31.7* 29.2* 30.7*  MCV 103.2*   < > 90.1  --  91.4 91.8 91.1  PLT 304   < > 137*  --  134* 125* 132*   < > = values in this interval not displayed.

## 2023-07-12 NOTE — Plan of Care (Signed)
  Problem: Clinical Measurements: Goal: Ability to maintain clinical measurements within normal limits will improve Outcome: Progressing Goal: Will remain free from infection Outcome: Progressing Goal: Respiratory complications will improve Outcome: Progressing   Problem: Coping: Goal: Level of anxiety will decrease Outcome: Progressing   Problem: Elimination: Goal: Will not experience complications related to bowel motility Outcome: Progressing Goal: Will not experience complications related to urinary retention Outcome: Progressing   Problem: Pain Managment: Goal: General experience of comfort will improve Outcome: Progressing   Problem: Safety: Goal: Ability to remain free from injury will improve Outcome: Progressing   Problem: Skin Integrity: Goal: Risk for impaired skin integrity will decrease Outcome: Progressing

## 2023-07-12 NOTE — Plan of Care (Signed)
?  Problem: Education: ?Goal: Knowledge of General Education information will improve ?Description: Including pain rating scale, medication(s)/side effects and non-pharmacologic comfort measures ?Outcome: Progressing ?  ?Problem: Clinical Measurements: ?Goal: Ability to maintain clinical measurements within normal limits will improve ?Outcome: Progressing ?Goal: Respiratory complications will improve ?Outcome: Progressing ?  ?Problem: Nutrition: ?Goal: Adequate nutrition will be maintained ?Outcome: Progressing ?  ?

## 2023-07-12 NOTE — Progress Notes (Signed)
HD#5 SUBJECTIVE:  Patient Summary: [name] is a 77 y.o. with a pertinent PMH of chronic tracheostomy for largyneal stenosis, CKD 5 progressed to ESRD, complete heart block s/p PPM, afib, HTN, AVMs, CHF, and COPD, who presented with melena and admitted for upper GIB that has now resolved.   Overnight Events: No acute events overnight  Interim History: [name] was seen at the bedside this morning. She feels well, and denies any chest pain, sob, melena, or hematochezia. She tolerated HD well yesterday.   OBJECTIVE:  Vital Signs: Vitals:   07/11/23 2015 07/11/23 2318 07/12/23 0349 07/12/23 0614  BP:    94/70  Pulse: 69 66  70  Resp: 14 15  16   Temp:    (!) 97.4 F (36.3 C)  TempSrc:    Oral  SpO2: 96% 93% 94% 100%  Weight:       Supplemental O2: Room Air SpO2: 100 % O2 Flow Rate (L/min): 6 L/min FiO2 (%): 28 %  Filed Weights   07/09/23 1216 07/11/23 1409 07/11/23 1809  Weight: 48.8 kg 51.6 kg 51.1 kg     Intake/Output Summary (Last 24 hours) at 07/12/2023 4401 Last data filed at 07/11/2023 2100 Gross per 24 hour  Intake 120 ml  Output 0.5 ml  Net 119.5 ml   Net IO Since Admission: 2,838.81 mL [07/12/23 0628]  Physical Exam: General: Pleasant, frail appearing female laying in bed with trach in place/capped CV: RRR. No LE edema Pulmonary: Lungs CTAB. Normal effort. No wheezing or rales. Abdominal: Soft, nontender, nondistended.  Extremities: LUE AV graft in place Skin: Warm and dry.  Neuro: A&Ox3. No focal deficit. Psych: Normal mood and affect     ASSESSMENT/PLAN:  Assessment: Principal Problem:   Acute on chronic anemia Active Problems:   Current use of long term anticoagulation   Tracheostomy present (HCC)   Melena   ESRD on dialysis (HCC)   Acute encephalopathy   Gastrointestinal hemorrhage   AVM (arteriovenous malformation) of duodenum, acquired with hemorrhage   AVM (arteriovenous malformation) of stomach, acquired with  hemorrhage   Protein-calorie malnutrition, severe   Discharge planning issues   Plan: GI bleed, resolved Anemia Presented with melena and had enteroscopy on 7/25 which showed multiple angiodysplastic lesions treated with APC. Hb is stable at 9.2 today. No further signs of bleeding - Protonix 40 mg bid - Carafate TID for 2 weeks - May need IV iron - starting ESA with HD on Tuesday - Resumed eliquis 2.5 mg bid for afib  CKD 5 progressed to ESRD on HD TTS The patient had HD yesterday and tolerated it well. Continue HD on TTS schedule. Still working towards trach decannulation, as this is a barrier to getting outpatient HD.   Trach collar Trach collar has been capped since 7/27 and the patient is saturating at 100% on room air. Working to figure out decannulation plan - initially goal was to do this as an outpatient but her trach is a barrier to outpatient HD. Will talk with her [facility] PCP to see if they could cover acute inpatient rehab, so the patient can continue to get HD and possibly start the decannulation process vs transfer to [facility] for this.     Best Practice: Diet: Full liquid IVF: Fluids: none VTE: apixaban (ELIQUIS) tablet 2.5 mg Start: 07/11/23 1000 SCDs Start: 07/07/23 1828 Code: Full AB: None Therapy Recs: CIR, Family Contact: Daughter, to be notified. DISPO: Anticipated discharge to  [facility]  pending  disposition planning with [facility]  PCP .  Signature: Elza Rafter, D.O.  Internal Medicine Resident, PGY-3 [facility]  Pager: [phone] 6:28 AM, 07/12/2023   Please contact the on call pager after 5 pm and on weekends at [phone].

## 2023-07-13 ENCOUNTER — Encounter (HOSPITAL_COMMUNITY): Payer: Self-pay | Admitting: Gastroenterology

## 2023-07-13 DIAGNOSIS — Z930 Tracheostomy status: Secondary | ICD-10-CM | POA: Diagnosis not present

## 2023-07-13 DIAGNOSIS — D649 Anemia, unspecified: Secondary | ICD-10-CM | POA: Diagnosis not present

## 2023-07-13 DIAGNOSIS — K921 Melena: Secondary | ICD-10-CM | POA: Diagnosis not present

## 2023-07-13 DIAGNOSIS — N186 End stage renal disease: Secondary | ICD-10-CM | POA: Diagnosis not present

## 2023-07-13 DIAGNOSIS — F0390 Unspecified dementia without behavioral disturbance: Secondary | ICD-10-CM

## 2023-07-13 DIAGNOSIS — E43 Unspecified severe protein-calorie malnutrition: Secondary | ICD-10-CM

## 2023-07-13 DIAGNOSIS — R627 Adult failure to thrive: Secondary | ICD-10-CM | POA: Diagnosis not present

## 2023-07-13 MED ORDER — HEPATITIS B VAC RECOMB ADJ 20 MCG/0.5ML IM SOSY
0.5000 mL | PREFILLED_SYRINGE | Freq: Once | INTRAMUSCULAR | Status: AC
  Administered 2023-07-13: 0.5 mL via INTRAMUSCULAR
  Filled 2023-07-13 (×2): qty 0.5

## 2023-07-13 MED ORDER — POTASSIUM CHLORIDE 20 MEQ PO PACK
60.0000 meq | PACK | Freq: Once | ORAL | Status: AC
  Administered 2023-07-13: 60 meq via ORAL
  Filled 2023-07-13: qty 3

## 2023-07-13 NOTE — Progress Notes (Signed)
Initial Nutrition Assessment  DOCUMENTATION CODES:   Underweight, Severe malnutrition in context of chronic illness  INTERVENTION:   Continue Vanilla or strawberry Ensure Plus High Protein po BID, each supplement provides 350 kcal and 20 grams of protein.  Continue Magic cup BID with meals, each supplement provides 290 kcal and 9 grams of protein (no chocolate)   Communicate with MD about diet advancement -MD placed consult for SLP assessment   Encouraged po intake   NUTRITION DIAGNOSIS:   Severe Malnutrition related to chronic illness as evidenced by severe fat depletion, severe muscle depletion. -ongoing   GOAL:   Patient will meet greater than or equal to 90% of their needs -not progressing   MONITOR:   PO intake, Supplement acceptance, Labs, Weight trends, Skin, I & O's  REASON FOR ASSESSMENT:   Malnutrition Screening Tool    ASSESSMENT:   77 y.o. female with PMHx including a-fib, ESRD, COPD, stroke, CHF, laryngeal stenosis s/p trach, and recurrent GI bleeds 2/2 intestinal AVM's was admitted with anemia and melenic stool  Visited patient at bedside today who reports she is tired of liquids being her sole source of nutrition. Patient is not enjoying the ONS RD ordered for her last Friday. She wishes for solid food. RD reached out to MD to request assessment by SLP so that patient's diet could be advanced or she could start working to advance her diet. MD is onboard with getting speech involved.   Patient asked RD to clear beverages from her bedside table because she was tired of looking at them. RD encouraged patient to keep her po potassium and encouraged her to drink it since IV potassium can cause a burning sensation when it is given via IV.   RD offered to make patient a milkshake. All she agreed to was a fresh ice water.   Labs: na 132, K+ 3.1, glu 114, BUN 29, cr 3.93 Meds: ensure BID, remeron, protonix, crestor, carafate  PO: 25-50% intake of full liquid  diet x last 5 documented meals  I/O's: +3.3 L   NUTRITION - FOCUSED PHYSICAL EXAM:  Flowsheet Row Most Recent Value  Orbital Region Severe depletion  Upper Arm Region Severe depletion  Thoracic and Lumbar Region Severe depletion  Buccal Region Severe depletion  Temple Region Severe depletion  Clavicle Bone Region Severe depletion  Clavicle and Acromion Bone Region Severe depletion  Scapular Bone Region Unable to assess  Dorsal Hand Severe depletion  Patellar Region Severe depletion  Anterior Thigh Region Severe depletion  Posterior Calf Region Severe depletion  Edema (RD Assessment) None  Hair Reviewed  Eyes Reviewed  Mouth Reviewed  Skin Reviewed  Nails Reviewed       Diet Order:   Diet Order             Diet full liquid Fluid consistency: Thin  Diet effective now                   EDUCATION NEEDS:   No education needs have been identified at this time  Skin:  Skin Assessment: Reviewed RN Assessment  Last BM:  7/28 type 6  Height:   Ht Readings from Last 1 Encounters:  06/25/23 5\' 5"  (1.651 m)    Weight:   Wt Readings from Last 1 Encounters:  07/11/23 51.1 kg    Ideal Body Weight:     BMI:  Body mass index is 18.75 kg/m.  Estimated Nutritional Needs:   Kcal:  1470-1715  Protein:  60-75 g  Fluid:  > 1.7L    Leodis Rains, RDN, LDN  Clinical Nutrition

## 2023-07-13 NOTE — Progress Notes (Signed)
Patient ID: [name], female   DOB: May 09, 1946, 77 y.o.   MRN: [id number]    Progress Note from the Palliative Medicine Team at [facility]   Patient Name: [name]        Date: 07/13/2023 DOB: 1946/08/06  Age: 77 y.o. MRN#: [id number] Attending Physician: Gust Rung, DO Primary Care Physician: [facility] Admit Date: 07/07/2023    Extensive chart review has been completed prior to meeting with patient/family  including labs, vital signs, imaging, progress/consult notes, orders, medications and available advance directive documents.   77 year old female with past medical history significant for complete heart block status post Abbott PPM in 1986, atrial fibrillation, hypertension, aortic atherosclerosis, laryngeal stenosis status post tracheostomy, CVA 5 of 2022, GI bleed 2012, AVM's, CHF, end-stage renal disease status post fistula--dialysis initiated this hospitalization, COPD, dementia.  Admitted for increased weakness and confusion over the last few days.  Patient is a member of [facility]  Admitted for treatment and stabilization   Family face treatment option decisions, advanced directive decisions and anticipatory care needs.  This NP assessed patient at the bedside as a follow up to  yesterday's GOCs meeting, [name]/daughter H POA/at bedside. Patient returned from procedure, resting comfortably.   Continued conversation regarding current medical situation patient's multiple comorbidities.  Daughter shares that the plan is to decannulation the trach, specifically in preparation for OP dialysis    Education offered today regarding  the importance of continued conversation with family and their  medical providers regarding overall plan of care and treatment options,  ensuring decisions are within the context of the patients values and GOCs.    PMT will continue to support holistically.      Questions and concerns addressed     Time:  25 minutes  Detailed review of medical records ( labs, imaging, vital signs), medically appropriate exam ( MS, skin, resp)   discussed with treatment team, counseling and education to patient, family, staff, documenting clinical information, medication management, coordination of care    Lorinda Creed NP  Palliative Medicine Team Team Phone # [phone] Pager [phone]

## 2023-07-13 NOTE — Progress Notes (Signed)
Physical Therapy Treatment Patient Details Name: [name] MRN: [id number] DOB: February 26, 1946 Today's Date: 07/13/2023   History of Present Illness 77 y.o. female presents to [facility] hospital on 07/07/2023 with symptomatic anemia and FTT. Small bowel enteroscopy on 7/25. PMH includes afib, ESRD, COPD, CVA, CHF, laryngeal stenosis s/p tracheostomy, chronic GIB, dementia.    PT Comments  Pt received in supine, agreeable to therapy session with encouragement. Pt needing up to maxA for sit<>stand from EOB with +2 HHA (face to face technique for pt safety) and pt unable to weight shift in stance. Worked on static sitting and reaching and seated UE/LE exercises for strengthening. Pt with soft BP sitting EOB and in supine, SpO2 and HR WFL on RA. Pt with secretion-laden cough, encouraged pt/daughter to keep HOB elevated and bed in chair posture for improved pulmonary clearance. Pt continues to benefit from PT services to progress toward functional mobility goals.     If plan is discharge home, recommend the following: A lot of help with walking and/or transfers;A lot of help with bathing/dressing/bathroom;Assistance with cooking/housework;Assist for transportation;Help with stairs or ramp for entrance;Direct supervision/assist for medications management;Direct supervision/assist for financial management   Can travel by private vehicle        Equipment Recommendations  Other (comment) (TBD)    Recommendations for Other Services       Precautions / Restrictions Precautions Precautions: Fall Precaution Comments: trach (capped) Restrictions Weight Bearing Restrictions: No     Mobility  Bed Mobility Overal bed mobility: Needs Assistance Bed Mobility: Supine to Sit, Sit to Supine     Supine to sit: HOB elevated, Mod assist Sit to supine: Min assist   General bed mobility comments: assistance with BLEs, increased trunk assist to sit up as pt having difficulty with cues for using bed rail to self  assist.    Transfers Overall transfer level: Needs assistance Equipment used: 2 person hand held assist (face to face HHA) Transfers: Sit to/from Stand, Bed to chair/wheelchair/BSC Sit to Stand: Mod assist, Max assist, +2 safety/equipment Stand pivot transfers:  (pt unable)         General transfer comment: STS x2 successful trials from EOB via face to face technique with HHA as pt not able to follow instructions safely using RW; poor standing tolerance and pt unable to weight shift in stance, returning to sit down impulsively.    Ambulation/Gait               General Gait Details: Pt not yet able to weight shift with face to face +2 HHA   Stairs             Wheelchair Mobility     Tilt Bed    Modified Rankin (Stroke Patients Only)       Balance Overall balance assessment: Needs assistance Sitting-balance support: No upper extremity supported, Feet supported Sitting balance-Leahy Scale: Fair Sitting balance - Comments: Fair to poor, min guard for safety and intermittent minA as she fatigues for trunk support   Standing balance support: Bilateral upper extremity supported, During functional activity Standing balance-Leahy Scale: Poor Standing balance comment: requires UE support for standing balance                            Cognition Arousal/Alertness: Awake/alert Behavior During Therapy: WFL for tasks assessed/performed Overall Cognitive Status: History of cognitive impairments - at baseline  General Comments: slow processing, difficulty with word finding. Daughter present and encouraging her/helpful.        Exercises General Exercises - Upper Extremity Shoulder Flexion: AAROM, Both, 10 reps, AROM (x3 reps AROM, the rest AAROM due to pt fatigue) Elbow Flexion: Both, Seated, AROM, AAROM, Strengthening, 10 reps (gentle AA for instruction on seated reaching/ROM) Elbow Extension: Strengthening,  Both, 10 reps, Seated, AAROM (gentle AA for arm "bicycle" for strengthening and improved hemodynamics while seated) Other Exercises Other Exercises: seated BLE AROM: hip flexion, LAQ x10 reps ea Other Exercises: seated anterior/lateral leans and reaching x3 reps ea direction Other Exercises: STS x 2 reps for strengthening    General Comments General comments (skin integrity, edema, etc.): BP 96/61 supine; BP 88/60 (69) sitting EOB HR 69-73 bpm; trach capped, SpO2 WFL on RA throughout per pulse ox sensor.      Pertinent Vitals/Pain Pain Assessment Pain Assessment: Faces Faces Pain Scale: Hurts little more Pain Location: Generalized Pain Descriptors / Indicators: Grimacing Pain Intervention(s): Monitored during session, Limited activity within patient's tolerance, Repositioned    Home Living                          Prior Function            PT Goals (current goals can now be found in the care plan section) Acute Rehab PT Goals Patient Stated Goal: to return to ambulation PT Goal Formulation: With patient Time For Goal Achievement: 07/24/23 Progress towards PT goals: Progressing toward goals    Frequency    Min 3X/week      PT Plan Current plan remains appropriate    Co-evaluation              AM-PAC PT "6 Clicks" Mobility   Outcome Measure  Help needed turning from your back to your side while in a flat bed without using bedrails?: A Little Help needed moving from lying on your back to sitting on the side of a flat bed without using bedrails?: A Lot Help needed moving to and from a bed to a chair (including a wheelchair)?: Total Help needed standing up from a chair using your arms (e.g., wheelchair or bedside chair)?: A Lot Help needed to walk in hospital room?: Total Help needed climbing 3-5 steps with a railing? : Total 6 Click Score: 10    End of Session Equipment Utilized During Treatment: Gait belt Activity Tolerance: Patient tolerated  treatment well;Treatment limited secondary to medical complications (Comment);Other (comment) (soft BP sitting EOB, may be related to her c/o fatigue) Patient left: in bed;with call bell/phone within reach;with bed alarm set;with family/visitor present;Other (comment) (pt heels floated, bed in chair posture; pt defers SCDs) Nurse Communication: Mobility status PT Visit Diagnosis: Other abnormalities of gait and mobility (R26.89);Muscle weakness (generalized) (M62.81)     Time: 7425-9563 PT Time Calculation (min) (ACUTE ONLY): 19 min  Charges:    $Therapeutic Exercise: 8-22 mins PT General Charges $$ ACUTE PT VISIT: 1 Visit                     Ginevra Tacker P., PTA Acute Rehabilitation Services Secure Chat Preferred 9a-5:30pm Office: [phone]    Dorathy Kinsman [facility] 07/13/2023, 6:04 PM

## 2023-07-13 NOTE — Progress Notes (Signed)
Nephrology Follow-Up Consult note   Assessment/Recommendations: [name] is a/an 77 y.o. female with a past medical history significant for CKD V, CHF, GI bleeding/AVMs, DM2, afib, dementia, frailty, admitted for GI bleeding and ARF.       AKI on CKD V --> ESRD.  Family wants to pursue dialysis             - Continue HD on TTS schedule  -  ESRD CLIP; dependent on trach status; working towards decannulation            - continue with AVG, working well  -  midodrine 10mg  with HD             - pal care involved   2.  Hyperkalemia: now resolved   3.  Anemia:             - suspect GI bleed on top of anemia of renal failure  - s/p push enteroscopy             - Transfusion per primary team  - Start esa on Tuesday   4.  BMM             - PTH 425, phos at goal  - ca corrects to normal when accounting for albumin   5.  Dementia:             - on Aricept             - pal care involved   6.  Dispo: floor, trying to cap and potentially remove trach to aid in placement  7. Nutrition: albumin 2. Push protein   Recommendations conveyed to primary service.    Bufford Buttner [facility] 07/13/2023 1:03 PM  ___________________________________________________________  CC: weakness, AMS  Interval History/Subjective: Seen in room.  No complaints.  For HD tomorrow.    Medications:  Current Facility-Administered Medications  Medication Dose Route Frequency Provider Last Rate Last Admin   0.9 %  sodium chloride infusion (Manually program via Guardrails IV Fluids)   Intravenous Once Rondel Baton, MD       0.9 %  sodium chloride infusion (Manually program via Guardrails IV Fluids)   Intravenous Once Champ Mungo, DO       acetaminophen (TYLENOL) tablet 650 mg  650 mg Oral Q6H PRN Atway, [name], DO   650 mg at 07/13/23 1243   Or   acetaminophen (TYLENOL) suppository 650 mg  650 mg Rectal Q6H PRN Atway, [name], DO       albumin human 25 % solution 25 g   25 g Intravenous Once PRN [name], MD       apixaban Everlene Balls) tablet 2.5 mg  2.5 mg Oral BID Atway, [name], DO   2.5 mg at 07/13/23 0946   Chlorhexidine Gluconate Cloth 2 % PADS 6 each  6 each Topical Q0600 Bufford Buttner, MD   6 each at 07/13/23 0552   [START ON 07/14/2023] Darbepoetin Alfa (ARANESP) injection 60 mcg  60 mcg Subcutaneous Q Tue-1800 [name], MD       donepezil (ARICEPT) tablet 10 mg  10 mg Oral QHS Atway, [name], DO   10 mg at 07/12/23 2159   feeding supplement (ENSURE ENLIVE / ENSURE PLUS) liquid 237 mL  237 mL Oral BID BM Dickie La, MD   237 mL at 07/13/23 0946   hepatitis b vaccine (HEPLISAV-B) injection 0.5 mL  0.5 mL Intramuscular Once Nooruddin, Jason Fila, MD  metoprolol succinate (TOPROL-XL) 24 hr tablet 12.5 mg  12.5 mg Oral Daily Atway, [name], DO   12.5 mg at 07/13/23 0946   midodrine (PROAMATINE) tablet 10 mg  10 mg Oral Q T,Th,Sa-HD [name], MD   10 mg at 07/11/23 1403   mirtazapine (REMERON) tablet 7.5 mg  7.5 mg Oral QHS Atway, [name], DO   7.5 mg at 07/12/23 2158   pantoprazole (PROTONIX) EC tablet 40 mg  40 mg Oral BID Atway, [name], DO   40 mg at 07/13/23 0946   rosuvastatin (CRESTOR) tablet 40 mg  40 mg Oral QHS Atway, [name], DO   40 mg at 07/12/23 2159   senna-docusate (Senokot-S) tablet 1 tablet  1 tablet Oral QHS PRN Atway, [name], DO       sucralfate (CARAFATE) 1 GM/10ML suspension 1 g  1 g Oral TID AC & HS Atway, [name], DO   1 g at 07/13/23 1228      Review of Systems: 10 systems reviewed and negative except per interval history/subjective  Physical Exam: Vitals:   07/13/23 0856 07/13/23 1113  BP:    Pulse: 63 70  Resp: 16 16  Temp:    SpO2: 96% 95%   No intake/output data recorded.  Intake/Output Summary (Last 24 hours) at 07/13/2023 1303 Last data filed at 07/13/2023 4098 Gross per 24 hour  Intake 240 ml  Output 0 ml  Net 240 ml   Constitutional: Sitting up in bed, no acute distress ENMT:  ears and nose without scars or lesions, MMM, trach in place  CV: normal rate, no edema Respiratory: Bilateral chest rise with no increased work of breathing Gastrointestinal: soft, non-tender, no palpable masses or hernias Skin: no visible lesions or rashes   Test Results I personally reviewed new and old clinical labs and radiology tests Lab Results  Component Value Date   NA 132 (L) 07/13/2023   K 3.1 (L) 07/13/2023   CL 95 (L) 07/13/2023   CO2 27 07/13/2023   BUN 29 (H) 07/13/2023   CREATININE 3.93 (H) 07/13/2023   GFR 16.19 (L) 10/01/2021   GLU 96 07/28/2016   CALCIUM 7.8 (L) 07/13/2023   ALBUMIN 1.8 (L) 07/13/2023   PHOS 2.1 (L) 07/13/2023    CBC Recent Labs  Lab 07/07/23 1545 07/07/23 2031 07/09/23 0235 07/09/23 0800 07/11/23 1123 07/12/23 0040 07/13/23 0256  WBC 13.8*   < > 10.6*   < > 8.2 7.6 7.6  NEUTROABS 13.0*  --  9.2*  --   --   --   --   HGB 5.8*   < > 10.8*   < > 9.0* 9.2* 8.6*  HCT 22.9*   < > 33.5*   < > 29.2* 30.7* 29.2*  MCV 103.2*   < > 90.1   < > 91.8 91.1 91.0  PLT 304   < > 137*   < > 125* 132* 159   < > = values in this interval not displayed.

## 2023-07-13 NOTE — Progress Notes (Signed)
Occupational Therapy Treatment Patient Details Name: [name] MRN: [id number] DOB: 06/19/1946 Today's Date: 07/13/2023   History of present illness 77 y.o. female [facility] hospital on [facility]07/[facility]23 with symptomatic anemia and FTT. Small bowel enteroscopy on 7/25. PMH includes afib, ESRD, COPD, CVA, CHF, laryngeal stenosis s/p tracheostomy, chronic GIB, dementia.   OT comments  Patient received in supine and agreeable to OT session. Patient required min assist to get to EOB due to assistance needed with BLEs. Patient performed transfer from EOB to chair to address toilet transfers with mod assist. Grooming tasks and UE HEP performed while seated in chair. RW attempted to transfer back to EOB with patient unable to take steps and performed stand pivot transfer with mod assist. Patient will benefit from intensive inpatient follow up therapy, >3 hours/day to further address functional transfers, self care, and BUE HEP. Acute OT to continue to follow.     Recommendations for follow up therapy are one component of a multi-disciplinary discharge planning process, led by the attending physician.  Recommendations may be updated based on patient status, additional functional criteria and insurance authorization.    Assistance Recommended at Discharge Frequent or constant Supervision/Assistance  Patient can return home with the following  Two people to help with bathing/dressing/bathroom;Two people to help with walking and/or transfers;Assistance with cooking/housework;Help with stairs or ramp for entrance;Assist for transportation;Direct supervision/assist for financial management;Direct supervision/assist for medications management   Equipment Recommendations  Other (comment) (TBD next venue)    Recommendations for Other Services      Precautions / Restrictions Precautions Precautions: Fall Precaution Comments: trach Restrictions Weight Bearing Restrictions: No       Mobility Bed  Mobility Overal bed mobility: Needs Assistance Bed Mobility: Supine to Sit, Sit to Supine     Supine to sit: Min assist, HOB elevated Sit to supine: Min assist   General bed mobility comments: assistance with BLEs    Transfers Overall transfer level: Needs assistance Equipment used: Rolling walker (2 wheels), 1 person hand held assist Transfers: Sit to/from Stand, Bed to chair/wheelchair/BSC Sit to Stand: Mod assist Stand pivot transfers: Mod assist         General transfer comment: transfer to chair from EOB with mod assist HHA, attempted RW transfer back to EOB with patient unable to take steps, mod assist wtih stand pivot back to EOB     Balance Overall balance assessment: Needs assistance Sitting-balance support: No upper extremity supported, Feet supported Sitting balance-Leahy Scale: Fair     Standing balance support: Single extremity supported, Bilateral upper extremity supported, During functional activity Standing balance-Leahy Scale: Poor Standing balance comment: requires UE support for standing balance                           ADL either performed or assessed with clinical judgement   ADL Overall ADL's : Needs assistance/impaired     Grooming: Supervision/safety;Set up;Sitting Grooming Details (indicate cue type and reason): on EOB                 Toilet Transfer: Moderate assistance Toilet Transfer Details (indicate cue type and reason): simulated to chair with one person [facility]                Extremity/Trunk Assessment              Vision       Perception     Praxis      Cognition Arousal/Alertness: Awake/alert  Behavior During Therapy: WFL for tasks assessed/performed Overall Cognitive Status: History of cognitive impairments - at baseline                                 General Comments: slow processing, difficulty with word finding.        Exercises Exercises: General Upper Extremity General  Exercises - Upper Extremity Shoulder Flexion: AAROM, Both, 10 reps Shoulder ABduction: AAROM, Both, 10 reps, Seated Elbow Flexion: Strengthening, Both, 10 reps, Seated, Theraband Theraband Level (Elbow Flexion): Level 1 (Yellow) Elbow Extension: Strengthening, Both, 10 reps, Seated, Theraband Theraband Level (Elbow Extension): Level 1 (Yellow)    Shoulder Instructions       General Comments      Pertinent Vitals/ Pain       Pain Assessment Pain Assessment: Faces Faces Pain Scale: Hurts little more Pain Location: bottom and generalized Pain Descriptors / Indicators: Grimacing Pain Intervention(s): Limited activity within patient's tolerance, Monitored during session, Repositioned  Home Living                                          Prior Functioning/Environment              Frequency  Min 1X/week        Progress Toward Goals  OT Goals(current goals can now be found in the care plan section)  Progress towards OT goals: Progressing toward goals  Acute Rehab OT Goals Patient Stated Goal: feel better OT Goal Formulation: With patient Time For Goal Achievement: 07/24/23 Potential to Achieve Goals: Good ADL Goals Pt Will Perform Grooming: with set-up;with supervision;sitting Pt Will Perform Upper Body Bathing: with supervision;with set-up;sitting Pt Will Perform Lower Body Bathing: with min guard assist;sit to/from stand Pt Will Perform Upper Body Dressing: with supervision;with set-up;sitting Pt Will Perform Lower Body Dressing: with min guard assist;sit to/from stand Pt Will Transfer to Toilet: with min guard assist;ambulating;bedside commode Pt Will Perform Toileting - Clothing Manipulation and hygiene: with min guard assist;sit to/from stand Additional ADL Goal #1: Pt will be S in and OOB for baisc ADLs  Plan Discharge plan remains appropriate    Co-evaluation                 AM-PAC OT "6 Clicks" Daily Activity     Outcome  Measure   Help from another person eating meals?: A Little Help from another person taking care of personal grooming?: A Little Help from another person toileting, which includes using toliet, bedpan, or urinal?: A Lot Help from another person bathing (including washing, rinsing, drying)?: A Lot Help from another person to put on and taking off regular upper body clothing?: A Little Help from another person to put on and taking off regular lower body clothing?: Total 6 Click Score: 14    End of Session Equipment Utilized During Treatment: Gait belt;Rolling walker (2 wheels)  OT Visit Diagnosis: Other abnormalities of gait and mobility (R26.89);Muscle weakness (generalized) (M62.81);Other symptoms and signs involving cognitive function   Activity Tolerance Patient tolerated treatment well   Patient Left in bed;with call bell/phone within reach;with bed alarm set   Nurse Communication Mobility status        Time: 1610-9604 OT Time Calculation (min): 27 min  Charges: OT General Charges $OT Visit: 1 Visit OT Treatments $Self Care/Home Management : 8-22 mins $  Therapeutic Exercise: 8-22 mins  Alfonse Flavors, OTA Acute Rehabilitation Services  Office [phone]   Dewain Penning 07/13/2023, 1:39 PM

## 2023-07-13 NOTE — Plan of Care (Signed)

## 2023-07-13 NOTE — TOC Progression Note (Signed)
Transition of Care [facility]) - Progression Note    Patient Details  Name: [name] MRN: [id number] Date of Birth: Apr 20, 1946  Transition of [facility]) CM/SW Contact  Lorri Frederick, LCSW Phone Number: 07/13/2023, 3:45 PM  Clinical Narrative:   Antony Salmon CSW.  Updates provided.           Expected Discharge Plan and Services                                               Social Determinants of Health (SDOH) Interventions SDOH Screenings   Depression (PHQ2-9): Low Risk  (05/20/2019)  Tobacco Use: Medium Risk (07/09/2023)    Readmission Risk Interventions    07/08/2023    3:26 PM  Readmission Risk Prevention Plan  Transportation Screening Complete  PCP or Specialist Appt within 3-5 Days Complete  HRI or Home Care Consult Complete  Social Work Consult for Recovery Care Planning/Counseling Complete  Palliative Care Screening Not Applicable  Medication Review Oceanographer) Referral to Pharmacy

## 2023-07-13 NOTE — Progress Notes (Addendum)
Following pt's case to assist with out-pt HD placement once plan for pt's trach is known. Advised team that referral for out-pt HD can be made if pt's trach can be capped and documentation can be provided to say pt able to tolerate trach being capped. Team also advised that referral can be made if pt is decannulated as well. Will continue to follow and assist as needed.   Olivia Canter Renal Navigator [phone]  Addendum at 4:20 pm: Pt's case discussed with attending. Pt's trach has been capped for several days and pt is tolerating it well. Pt is currently on room air per medical staff. Referral submitted to [facility] admissions for review due to pt living in [place] and pt being followed by [facility]. Contacted daughter via phone to discuss out-pt HD referral but had to leave a message requesting a return call. Pt's referral will require special medical approval due to pt having a capped trach. Will assist as needed.

## 2023-07-13 NOTE — Progress Notes (Addendum)
Subjective: [name] is a 77 year-old female who has a PMHx of of complete heart block s/p Abbott PPM in 1986, atrial fibrillation, HTN, aortic atherosclerosis, laryngeal stenosis s/p tracheostomy, CVA 04/2019, GI bleed 2012, AVMs, HCM/chronic congestive heart failure, ESRD s/p fistula, COPD who presented with decreased PO intake and melena found to have acute on chronic anemia and GI bleed   Reports some shortness of breath and feeling weak. Has had minimal PO intake. Denies CP and abd pain. Tracheostomy valve continues to remain capped with normal sats    Objective: Vital signs in last 24 hours: Vitals:   07/13/23 0500 07/13/23 0600 07/13/23 0836 07/13/23 0856  BP:   (!) 121/57   Pulse:   69 63  Resp:   16 16  Temp:   98 F (36.7 C)   TempSrc:      SpO2: 95% 96% 98% 96%  Weight:       Weight change:   Intake/Output Summary (Last 24 hours) at 07/13/2023 1101 Last data filed at 07/13/2023 8756 Gross per 24 hour  Intake 360 ml  Output 0 ml  Net 360 ml    Physical Exam: General appearance: frail, awake and alert, NAD Eyes: conjunctivae/corneas clear. PERRL, EOM's intact Neck:  tracheostomy valve capped  Lungs: normal WOB, CTAB, no wheezing  Heart: RRR, S1, S2 normal, no m/r/g Abdomen: soft, NTND Extremities: No LE edema. LUE AV graft fistula in place Neurologic: No focal deficits   Lab Results:    Latest Ref Rng & Units 07/13/2023    2:56 AM 07/12/2023   12:40 AM 07/11/2023   11:23 AM  CBC  WBC 4.0 - 10.5 K/uL 7.6  7.6  8.2   Hemoglobin 12.0 - 15.0 g/dL 8.6  9.2  9.0   Hematocrit 36.0 - 46.0 % 29.2  30.7  29.2   Platelets 150 - 400 K/uL 159  132  125       Latest Ref Rng & Units 07/13/2023    2:56 AM 07/12/2023   12:40 AM 07/11/2023    2:57 AM  CMP  Glucose 70 - 99 mg/dL 433  295  188   BUN 8 - 23 mg/dL 29  11  28    Creatinine 0.44 - 1.00 mg/dL 4.16  6.06  3.01   Sodium 135 - 145 mmol/L 132  135  135   Potassium 3.5 - 5.1 mmol/L 3.1  3.4  3.3   Chloride  98 - 111 mmol/L 95  96  95   CO2 22 - 32 mmol/L 27  27  24    Calcium 8.9 - 10.3 mg/dL 7.8  8.0  8.2    No results for input(s): "GLUCAP" in the last 72 hours.   Micro Results: No results found for this or any previous visit (from the past 240 hour(s)).  Studies/Results: No results found.  Medications: I have reviewed the patient's current medications. Scheduled Meds:  sodium chloride   Intravenous Once   sodium chloride   Intravenous Once   apixaban  2.5 mg Oral BID   Chlorhexidine Gluconate Cloth  6 each Topical Q0600   [START ON 07/14/2023] darbepoetin (ARANESP) injection - DIALYSIS  60 mcg Subcutaneous Q Tue-1800   donepezil  10 mg Oral QHS   feeding supplement  237 mL Oral BID BM   metoprolol succinate  12.5 mg Oral Daily   midodrine  10 mg Oral Q T,Th,Sa-HD   mirtazapine  7.5 mg Oral QHS   pantoprazole  40 mg  Oral BID   rosuvastatin  40 mg Oral QHS   sucralfate  1 g Oral TID AC & HS   Continuous Infusions:  albumin human     PRN Meds:.acetaminophen **OR** acetaminophen, albumin human, senna-docusate  Assessment/Plan: Principal Problem:   Acute on chronic anemia Active Problems:   Current use of long term anticoagulation   Tracheostomy present (HCC)   Melena   ESRD on dialysis (HCC)   Acute encephalopathy   Gastrointestinal hemorrhage   AVM (arteriovenous malformation) of duodenum, acquired with hemorrhage   AVM (arteriovenous malformation) of stomach, acquired with hemorrhage   Protein-calorie malnutrition, severe   Discharge planning issues   Failure to thrive in adult  [name] is a 77 year-old female who has a PMHx of of complete heart block s/p Abbott PPM in 1986, atrial fibrillation, HTN, aortic atherosclerosis, laryngeal stenosis s/p tracheostomy, CVA 04/2019, GI bleed 2012, AVMs, HCM/chronic congestive heart failure, ESRD s/p fistula, COPD who presented with decreased PO intake and melena found to have acute on chronic anemia and GI bleed    Active Problems   #Melena  UGIB  H/o small bowel AVMs #Decreased PO intake #Acute on chronic anemia, stable   Iron levels low at 38, Tsat 4%, with ferritin 76. Enteroscopy on 7/25 showed multiple angiodysplastic lesions treated with APC. Had minimal PO intake. No reports of hematemesis or melena    -Cont PO protonix 40mg  BID  -Cont Carafate TID for 2 weeks  -Cont Eliquis 2.5mg  BID  -Consider IV iron replacement -Cont feeding supplement  -Consult for dietician to increase PO intake  -PT/OT    #ESRD  AV graft fistula #Hyperkalemia, moderate   Left UE AV graft placement on 06/01/23. Unclear her baseline Cr but likely ~2-3. Hemodialysis schedule T/Th/Sat. HbsAb noted to show incomplete immunity. HBsAg non-reactive  -Recommend obtaining HBV vaccine for ongoing dialysis  -Daily BMP  -Potassium chloride 60 mEq packet  -Start Darbepoetin alfa injection 60mg  SQ every Tues    #Trach collar Trach collar has been capped since 7/27. Has not required oxygen. Continuing to discuss decannulation plan - since her trach is a barrier to outpatient HD. Will follow-up with [facility] PCP to see about discussion. Considering AIR vs LTACH for disposition  -Contact [facility] PCP  -Social work following   #Dementia  Reported onset of dementia in 2021. -Continue home donepezil 10mg  daily   -Palliative care following for GOC  Chronic Problems   #Atrial fibrillation, controlled #HLD, controlled  #H/o stroke Reported stroke on 04/2019 with residual expressive aphasia. Last lipid panel showed LDL 55 (05/29/23) -Cont home rosuvastatin 40mg   -Continue metoprolol succinate 12.5mg  daily   -Cont Eliquis 2.5mg  BID    #Chronic CHF  HCM  #Complete heart block s/p PPM (1986)  Follows with HF clinic.  -Continue Metoprolol succinate  -May consider home diuretic on non-dialysis days based on volume status      #HTN, controlled -Continue antihypertensives as stated above   This is a Psychologist, occupational Note.  The care  of the patient was discussed with Dr. Ned Card and the assessment and plan formulated with their assistance.  Please see their attached note for official documentation of the daily encounter.   LOS: 6 days   Loyal Buba 07/13/2023, 11:01 AM   Attestation for Student Documentation:  I personally was present and re-performed the history, physical exam and medical decision-making activities of this service and have verified that the service and findings are accurately documented in the student's note.  Olegario Messier, MD 07/13/2023, 11:47 AM

## 2023-07-14 DIAGNOSIS — K921 Melena: Secondary | ICD-10-CM | POA: Diagnosis not present

## 2023-07-14 DIAGNOSIS — E43 Unspecified severe protein-calorie malnutrition: Secondary | ICD-10-CM | POA: Diagnosis not present

## 2023-07-14 DIAGNOSIS — N186 End stage renal disease: Secondary | ICD-10-CM | POA: Diagnosis not present

## 2023-07-14 DIAGNOSIS — Z930 Tracheostomy status: Secondary | ICD-10-CM | POA: Diagnosis not present

## 2023-07-14 MED ORDER — ALBUMIN HUMAN 25 % IV SOLN
INTRAVENOUS | Status: AC
  Administered 2023-07-14: 25 g
  Filled 2023-07-14: qty 100

## 2023-07-14 MED ORDER — MIDODRINE HCL 5 MG PO TABS
ORAL_TABLET | ORAL | Status: AC
  Filled 2023-07-14: qty 1

## 2023-07-14 NOTE — Evaluation (Signed)
Clinical/Bedside Swallow Evaluation Patient Details  Name: [name] MRN: [id number] Date of Birth: 1946/04/17  Today's Date: 07/14/2023 Time: SLP Start Time (ACUTE ONLY): 1335 SLP Stop Time (ACUTE ONLY): 1345 SLP Time Calculation (min) (ACUTE ONLY): 10 min  Past Medical History:  Past Medical History:  Diagnosis Date   Anemia    Angioedema    2/2 ACE   Arteriovenous malformation of stomach    Arthritis    Asthma    AVM (arteriovenous malformation) of colon    small intestine; stomach   Blood transfusion    CHF (congestive heart failure) (HCC)    CKD (chronic kidney disease) stage 4, GFR 15-29 ml/min (HCC) 09/24/2010   Baseline Cr 1.3-1.6   Complete heart block (HCC)    s/p PPM 1998   Complication of anesthesia    Difficult airway; anaphylaxis and swelling with propofol   COPD (chronic obstructive pulmonary disease) (HCC)    DDD (degenerative disc disease)    Dementia (HCC)    Depression    Diabetes mellitus, type 2 (HCC)    pt's daughter states pt has never had diabetes   Diastolic heart failure    Fatty liver 07/26/2010   GERD (gastroesophageal reflux disease)    GI bleed    History of alcohol abuse Stopped Fall 2012   History of tobacco use Quit Fall 2012   Hx of cardiovascular stress test    a. Lexiscan Myoview (10/15):  Small inferolateral and apical defect c/w scar and poss soft tissue attenuation, no ischemia, EF 42%   Hx of colonic polyp 08/13/2010   adenomatous   Hx of colonoscopy    Hyperlipidemia    Hypertension    Hypertrophic cardiomyopathy (HCC)    dx by Dr Juanda Chance 2009   Iron deficiency anemia    Myocardial infarction [facility])    Pacemaker    Panic attacks    Permanent atrial fibrillation (HCC)    Pneumonia    Renal failure    baseline creatinine 1.6   Right arm pain 01/08/2012   RLS (restless legs syndrome)    Dx 06/2007   Sleep apnea    no cpap    Stroke [facility])    Past Surgical History:  Past Surgical History:  Procedure Laterality  Date   AV FISTULA PLACEMENT Left 09/09/2022   Procedure: LEFT BRACHEOCEPHALIC FISTULA CREATION;  Surgeon: Chuck Hint, MD;  Location: [facility] OR;  Service: Vascular;  Laterality: Left;   AV FISTULA PLACEMENT Left 06/02/2023   Procedure: INSERTION OF LEFT UPPER ARM ARTERIOVENOUS (AV) GORE-TEX GRAFT;  Surgeon: Chuck Hint, MD;  Location: [facility] OR;  Service: Vascular;  Laterality: Left;   BIOPSY  09/04/2020   Procedure: BIOPSY;  Surgeon: Napoleon Form, MD;  Location: [facility] ENDOSCOPY;  Service: Endoscopy;;   CARDIAC CATHETERIZATION     CARDIAC CATHETERIZATION N/A 08/19/2016   Procedure: Right/Left Heart Cath and Coronary Angiography;  Surgeon: Dolores Patty, MD;  Location: [facility] INVASIVE CV LAB;  Service: Cardiovascular;  Laterality: N/A;   COLONOSCOPY WITH PROPOFOL N/A 06/16/2017   Procedure: COLONOSCOPY WITH PROPOFOL;  Surgeon: Napoleon Form, MD;  Location: [facility] ENDOSCOPY;  Service: Endoscopy;  Laterality: N/A;   COLONOSCOPY WITH PROPOFOL N/A 09/04/2020   Procedure: COLONOSCOPY WITH PROPOFOL;  Surgeon: Napoleon Form, MD;  Location: [facility] ENDOSCOPY;  Service: Endoscopy;  Laterality: N/A;   DIRECT LARYNGOSCOPY N/A 09/18/2016   Procedure: DIRECT LARYNGOSCOPY, BRONCHOSCOPY, REMOVAL OF INTUBATION GRANULOMA;  Surgeon: Flo Shanks, MD;  Location: [facility] OR;  Service:  ENT;  Laterality: N/A;   DIRECT LARYNGOSCOPY N/A 10/20/2016   Procedure: EXTUBATION AND FLEXIBLE LARYNGOSCOPE;  Surgeon: Flo Shanks, MD;  Location: [facility] OR;  Service: ENT;  Laterality: N/A;   DIRECT LARYNGOSCOPY N/A 10/29/2016   Procedure: DIRECT LARYNGOSCOPY;  Surgeon: Flo Shanks, MD;  Location: [facility] OR;  Service: ENT;  Laterality: N/A;   ENTEROSCOPY N/A 09/30/2021   Procedure: ENTEROSCOPY;  Surgeon: Benancio Deeds, MD;  Location: [facility] ENDOSCOPY;  Service: Gastroenterology;  Laterality: N/A;   ESOPHAGOGASTRODUODENOSCOPY  12/23/2011   Procedure: ESOPHAGOGASTRODUODENOSCOPY (EGD);  Surgeon: Hart Carwin, MD;  Location: [facility]  ENDOSCOPY;  Service: Endoscopy;  Laterality: N/A;   ESOPHAGOGASTRODUODENOSCOPY N/A 07/09/2023   Procedure: ESOPHAGOGASTRODUODENOSCOPY (EGD);  Surgeon: Jenel Lucks, MD;  Location: [facility] ENDOSCOPY;  Service: Gastroenterology;  Laterality: N/A;   ESOPHAGOGASTRODUODENOSCOPY (EGD) WITH PROPOFOL N/A 06/16/2017   Procedure: ESOPHAGOGASTRODUODENOSCOPY (EGD) WITH PROPOFOL;  Surgeon: Napoleon Form, MD;  Location: [facility] ENDOSCOPY;  Service: Endoscopy;  Laterality: N/A;   ESOPHAGOGASTRODUODENOSCOPY (EGD) WITH PROPOFOL N/A 09/04/2020   Procedure: ESOPHAGOGASTRODUODENOSCOPY (EGD) WITH PROPOFOL;  Surgeon: Napoleon Form, MD;  Location: [facility] ENDOSCOPY;  Service: Endoscopy;  Laterality: N/A;   EXTUBATION (ENDOTRACHEAL) IN OR N/A 07/21/2016   Procedure: EXTUBATION (ENDOTRACHEAL) IN OR;  Surgeon: Flo Shanks, MD;  Location: [facility] OR;  Service: ENT;  Laterality: N/A;   GIVENS CAPSULE STUDY  12/23/2011   Procedure: GIVENS CAPSULE STUDY;  Surgeon: Hart Carwin, MD;  Location: [facility] ENDOSCOPY;  Service: Endoscopy;  Laterality: N/A;   HEMOSTASIS CONTROL  07/09/2023   Procedure: HEMOSTASIS CONTROL;  Surgeon: Jenel Lucks, MD;  Location: [facility] ENDOSCOPY;  Service: Gastroenterology;;   HOT HEMOSTASIS N/A 09/04/2020   Procedure: HOT HEMOSTASIS (ARGON PLASMA COAGULATION/BICAP);  Surgeon: Napoleon Form, MD;  Location: [facility] ENDOSCOPY;  Service: Endoscopy;  Laterality: N/A;   HOT HEMOSTASIS N/A 09/30/2021   Procedure: HOT HEMOSTASIS (ARGON PLASMA COAGULATION/BICAP);  Surgeon: Benancio Deeds, MD;  Location: [facility] ENDOSCOPY;  Service: Gastroenterology;  Laterality: N/A;   HOT HEMOSTASIS N/A 07/09/2023   Procedure: HOT HEMOSTASIS (ARGON PLASMA COAGULATION/BICAP);  Surgeon: Jenel Lucks, MD;  Location: [facility] ENDOSCOPY;  Service: Gastroenterology;  Laterality: N/A;   MICROLARYNGOSCOPY WITH LASER N/A 10/16/2016   Procedure: MICRODIRECTLARYNGOSCOPY WITH LASER ABLATION AND KENLOG INJECTION;  Surgeon: Flo Shanks, MD;   Location: [facility] OR;  Service: ENT;  Laterality: N/A;   PACEMAKER INSERTION  1998   st jude, most recent gen change by [facility] 4/12   POLYPECTOMY  09/04/2020   Procedure: POLYPECTOMY;  Surgeon: Napoleon Form, MD;  Location: [facility] ENDOSCOPY;  Service: Endoscopy;;   SUBMUCOSAL TATTOO INJECTION  09/30/2021   Procedure: SUBMUCOSAL TATTOO INJECTION;  Surgeon: Benancio Deeds, MD;  Location: [facility] ENDOSCOPY;  Service: Gastroenterology;;   TRACHEOSTOMY TUBE PLACEMENT N/A 10/29/2016   Procedure: TRACHEOSTOMY;  Surgeon: Flo Shanks, MD;  Location: [facility] OR;  Service: ENT;  Laterality: N/A;   TUBAL LIGATION  04/01/2000   HPI:  Patient is a 77 y.o. female with PMH: dementia, complete heart block s/p Abbott PPM in 1986, atrial fibrillation, HTN, aortic atherosclerosis, laryngeal stenosis s/p tracheostomy (2017), CVA 04/2019, GI bleed 2012, AVMs, HCM/chronic congestive heart failure, ESRD s/p fistula, COPD who presented to the hospital on 07/07/23 with decreased PO intake and melena found to have acute on chronic anemia and GI bleed. She was started on a full liquids diet on 07/09/23, Her trach was capped on 7/27. On 7/29, RD contacted MD due to patient wanting solid food and so SLP swallow  evaluation was ordered.    Assessment / Plan / Recommendation  Clinical Impression  Patient is presenting with clinical s/s of dysphagia as per this bedside swallow evaluation. In addition, she is fatigued from having just gotten back from dialysis. She was oriented to self and place but not month, date, year. She did recall that plan was to remove trach but she could not recall how long she has had it. (since 2017) She was receptive to straw sips of thin liquids (juice) but pleasantly declined other PO's. She exhibited multiple swallows with each sip, decreased hyolaryngeal movement and suspected swallow initiation delay but no overt s/s aspiration. SLP recommending continue full liquids diet and will follow for ability to  advance. SLP Visit Diagnosis: Dysphagia, unspecified (R13.10)    Aspiration Risk  Mild aspiration risk    Diet Recommendation Thin liquid;Other (Comment) (full liquids)    Liquid Administration via: Straw;Cup Medication Administration: Other (Comment) (as tolerated) Supervision: Full supervision/cueing for compensatory strategies;Staff to assist with self feeding Compensations: Slow rate;Small sips/bites Postural Changes: Seated upright at 90 degrees    Other  Recommendations Oral Care Recommendations: Oral care BID    Recommendations for follow up therapy are one component of a multi-disciplinary discharge planning process, led by the attending physician.  Recommendations may be updated based on patient status, additional functional criteria and insurance authorization.  Follow up Recommendations Acute inpatient rehab (3hours/day)      Assistance Recommended at Discharge    Functional Status Assessment Patient has had a recent decline in their functional status and demonstrates the ability to make significant improvements in function in a reasonable and predictable amount of time.  Frequency and Duration min 2x/week  2 weeks       Prognosis Prognosis for improved oropharyngeal function: Good Barriers to Reach Goals: Time post onset;Cognitive deficits      Swallow Study   General Date of Onset: 07/13/23 HPI: Patient is a 77 y.o. female with PMH: dementia, complete heart block s/p Abbott PPM in 1986, atrial fibrillation, HTN, aortic atherosclerosis, laryngeal stenosis s/p tracheostomy (2017), CVA 04/2019, GI bleed 2012, AVMs, HCM/chronic congestive heart failure, ESRD s/p fistula, COPD who presented to the hospital on 07/07/23 with decreased PO intake and melena found to have acute on chronic anemia and GI bleed. She was started on a full liquids diet on 07/09/23, Her trach was capped on 7/27. On 7/29, RD contacted MD due to patient wanting solid food and so SLP swallow evaluation was  ordered. Type of Study: Bedside Swallow Evaluation Previous Swallow Assessment: none found Diet Prior to this Study: Full liquid diet;Thin liquids (Level 0) Temperature Spikes Noted: No Respiratory Status: Room air History of Recent Intubation: No Behavior/Cognition: Alert;Cooperative;Pleasant mood;Lethargic/Drowsy Oral Cavity Assessment: Within Functional Limits Oral Care Completed by SLP: Yes Self-Feeding Abilities: Needs assist;Needs set up Patient Positioning: Upright in bed Baseline Vocal Quality: Low vocal intensity;Hoarse Volitional Cough: Weak Volitional Swallow: Unable to elicit    Oral/Motor/Sensory Function Overall Oral Motor/Sensory Function: Generalized oral weakness   Ice Chips     Thin Liquid Thin Liquid: Impaired Presentation: Straw Pharyngeal  Phase Impairments: Multiple swallows;Decreased hyoid-laryngeal movement    Nectar Thick     Honey Thick     Puree Puree: Not tested   Solid     Solid: Not tested      Angela Nevin, MA, CCC-SLP Speech Therapy

## 2023-07-14 NOTE — [place] FL2 (Signed)
[place] MEDICAID FL2 LEVEL OF CARE FORM     IDENTIFICATION  Patient Name: [name] Birthdate: 11/21/1946 Sex: female Admission Date (Current Location): 07/07/2023  [facility] and [place] Number:  Producer, television/film/video and Address:  [facility], [address], [place], [place] [address]      Provider Number: [id number]  Attending Physician Name and Address:  Gust Rung, DO  Relative Name and Phone Number:  Nidya, Dobbe (Daughter)  [phone]    Current Level of Care: Hospital Recommended Level of Care: Skilled Nursing Facility Prior Approval Number:    Date Approved/Denied:   PASRR Number: [id number]  Discharge Plan: SNF    Current Diagnoses: Patient Active Problem List   Diagnosis Date Noted   Failure to thrive in adult 07/12/2023   Protein-calorie malnutrition, severe 07/10/2023   Discharge planning issues 07/10/2023   AVM (arteriovenous malformation) of duodenum, acquired with hemorrhage 07/09/2023   AVM (arteriovenous malformation) of stomach, acquired with hemorrhage 07/09/2023   Melena 07/08/2023   ESRD on dialysis (HCC) 07/08/2023   Acute encephalopathy 07/08/2023   Gastrointestinal hemorrhage 07/08/2023   Acute on chronic anemia 07/07/2023   AKI (acute kidney injury) (HCC) 04/23/2020   Cough with hemoptysis 08/02/2019   SOB (shortness of breath)    Atypical chest pain    Chest pain on exertion 05/15/2019   Cerebral embolism with cerebral infarction 05/09/2019   Expressive aphasia 05/08/2019   Other abnormal glucose 11/26/2018   Polypharmacy 06/25/2018   Depression with anxiety    Tracheostomy present (HCC)    Tracheostomy care (HCC)    Calculus of gallbladder without cholecystitis without obstruction 12/24/2017   Chronic cough 09/25/2017   History of colonic polyps    Polyp of transverse colon    Polyp of sigmoid colon    Moderate protein-calorie malnutrition (HCC) 06/02/2017   Chronic idiopathic constipation  03/19/2017   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   Permanent atrial fibrillation (HCC)    Laryngeal stenosis 09/18/2016   Esophageal dysphagia    Polymyalgia rheumatica (HCC) 04/10/2015   Hypertrophic cardiomyopathy (HCC) 02/15/2015   Eczema 09/18/2014   Environmental allergies 09/18/2014   Pre-diabetes 11/26/2013   COPD, moderate (HCC) 11/01/2013   Complete heart block (HCC) 04/03/2011   Acute on chronic diastolic heart failure (HCC) 02/20/2011   Anemia 02/17/2011   AVM (arteriovenous malformation) of small bowel, acquired 02/13/2011   Current use of long term anticoagulation 01/15/2011   PACEMAKER-St.Jude 11/28/2010   CKD (chronic kidney disease) stage 4, GFR 15-29 ml/min (HCC) 09/24/2010   Acute on chronic congestive heart failure (HCC) 08/30/2010   ARTHRITIS 07/24/2010   Generalized anxiety disorder 07/23/2010   Hyperlipidemia 02/11/2007   Essential hypertension 02/11/2007   APNEA, SLEEP 02/11/2007    Orientation RESPIRATION BLADDER Height & Weight     Self, Situation, Place  Normal Incontinent Weight: 109 lb 2 oz (49.5 kg) Height:  5\' 5"  (165.1 cm)  BEHAVIORAL SYMPTOMS/MOOD NEUROLOGICAL BOWEL NUTRITION STATUS      Incontinent Diet (see d/c summary)  AMBULATORY STATUS COMMUNICATION OF NEEDS Skin   Total Care Verbally Surgical wounds (incision- Neck Medial)                       Personal Care Assistance Level of Assistance  Feeding, Dressing, Bathing Bathing Assistance: Maximum assistance Feeding assistance: Limited assistance Dressing Assistance: Maximum assistance     Functional Limitations Info  Sight, Hearing, Speech Sight Info: Impaired Hearing Info: Adequate Speech Info: Adequate (clear, trach)  SPECIAL CARE FACTORS FREQUENCY  PT (By licensed PT), OT (By licensed OT)     PT Frequency: 5x/ week OT Frequency: 5x/ week            Contractures Contractures Info: Not present    Additional Factors Info  Code Status, Allergies Code Status Info:  FULL Allergies Info: Ace Inhibitors  Other  Propofol           Current Medications (07/14/2023):  This is the current hospital active medication list Current Facility-Administered Medications  Medication Dose Route Frequency Provider Last Rate Last Admin   0.9 %  sodium chloride infusion (Manually program via Guardrails IV Fluids)   Intravenous Once Rondel Baton, MD       0.9 %  sodium chloride infusion (Manually program via Guardrails IV Fluids)   Intravenous Once Champ Mungo, DO       acetaminophen (TYLENOL) tablet 650 mg  650 mg Oral Q6H PRN Atway, [name], DO   650 mg at 07/14/23 1549   Or   acetaminophen (TYLENOL) suppository 650 mg  650 mg Rectal Q6H PRN Atway, [name], DO       albumin human 25 % solution 25 g  25 g Intravenous Once PRN [name], MD       apixaban Everlene Balls) tablet 2.5 mg  2.5 mg Oral BID Atway, [name], DO   2.5 mg at 07/14/23 1235   Chlorhexidine Gluconate Cloth 2 % PADS 6 each  6 each Topical Q0600 Bufford Buttner, MD   6 each at 07/13/23 0552   Darbepoetin Alfa (ARANESP) injection 60 mcg  60 mcg Subcutaneous Q Tue-1800 [name], MD       donepezil (ARICEPT) tablet 10 mg  10 mg Oral QHS Atway, [name], DO   10 mg at 07/13/23 2303   feeding supplement (ENSURE ENLIVE / ENSURE PLUS) liquid 237 mL  237 mL Oral BID BM Dickie La, MD   237 mL at 07/13/23 0946   metoprolol succinate (TOPROL-XL) 24 hr tablet 12.5 mg  12.5 mg Oral Daily Atway, [name], DO   12.5 mg at 07/14/23 1235   midodrine (PROAMATINE) 5 MG tablet            midodrine (PROAMATINE) tablet 10 mg  10 mg Oral Q T,Th,Sa-HD [name], MD   10 mg at 07/14/23 0936   mirtazapine (REMERON) tablet 7.5 mg  7.5 mg Oral QHS Atway, [name], DO   7.5 mg at 07/13/23 2304   pantoprazole (PROTONIX) EC tablet 40 mg  40 mg Oral BID Atway, [name], DO   40 mg at 07/14/23 1235   rosuvastatin (CRESTOR) tablet 40 mg  40 mg Oral QHS Atway, [name], DO   40 mg at 07/13/23 2303    senna-docusate (Senokot-S) tablet 1 tablet  1 tablet Oral QHS PRN Atway, [name], DO       sucralfate (CARAFATE) 1 GM/10ML suspension 1 g  1 g Oral TID AC & HS Atway, [name], DO   1 g at 07/14/23 1235     Discharge Medications: Please see discharge summary for a list of discharge medications.  Relevant Imaging Results:  Relevant Lab Results:   Additional Information SSN: [id number], HD patient- new to dialysis and in the process of securing an outpatient clinic, has trach that is capped.  Catalina Pizza Steele Stracener, LCSW

## 2023-07-14 NOTE — TOC Progression Note (Addendum)
Transition of Care [facility]) - Initial/Assessment Note    Patient Details  Name: [name] MRN: [id number] Date of Birth: December 11, 1946  [facility]) CM/SW Contact:    Ralene Bathe, LCSW Phone Number: 07/14/2023, 3:57 PM  Clinical Narrative:                 LCSW spoke with Marylene Land, patient's [facility] social worker, and was informed that the agency has approved SNF placement.  Per Marylene Land, the patient's daughter prefers [place].  TOC will complete SNF workup and send to facilities in network with [facility].  TOC following.    Patient Goals and CMS Choice            Expected Discharge Plan and Services                                              Prior Living Arrangements/Services                       Activities of Daily Living Home Assistive Devices/Equipment: None ADL Screening (condition at time of admission) Patient's cognitive ability adequate to safely complete daily activities?: Yes Is the patient deaf or have difficulty hearing?: No Does the patient have difficulty seeing, even when wearing glasses/contacts?: No Does the patient have difficulty concentrating, remembering, or making decisions?: Yes Patient able to express need for assistance with ADLs?: Yes Does the patient have difficulty dressing or bathing?: Yes Independently performs ADLs?: No Does the patient have difficulty walking or climbing stairs?: Yes Weakness of Legs: Both Weakness of Arms/Hands: None  Permission Sought/Granted                  Emotional Assessment              Admission diagnosis:  Hyperkalemia [E87.5] Patient Active Problem List   Diagnosis Date Noted   Failure to thrive in adult 07/12/2023   Protein-calorie malnutrition, severe 07/10/2023   Discharge planning issues 07/10/2023   AVM (arteriovenous malformation) of duodenum, acquired with hemorrhage 07/09/2023   AVM (arteriovenous malformation) of stomach, acquired with hemorrhage  07/09/2023   Melena 07/08/2023   ESRD on dialysis (HCC) 07/08/2023   Acute encephalopathy 07/08/2023   Gastrointestinal hemorrhage 07/08/2023   Acute on chronic anemia 07/07/2023   AKI (acute kidney injury) (HCC) 04/23/2020   Cough with hemoptysis 08/02/2019   SOB (shortness of breath)    Atypical chest pain    Chest pain on exertion 05/15/2019   Cerebral embolism with cerebral infarction 05/09/2019   Expressive aphasia 05/08/2019   Other abnormal glucose 11/26/2018   Polypharmacy 06/25/2018   Depression with anxiety    Tracheostomy present (HCC)    Tracheostomy care (HCC)    Calculus of gallbladder without cholecystitis without obstruction 12/24/2017   Chronic cough 09/25/2017   History of colonic polyps    Polyp of transverse colon    Polyp of sigmoid colon    Moderate protein-calorie malnutrition (HCC) 06/02/2017   Chronic idiopathic constipation 03/19/2017   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   Permanent atrial fibrillation (HCC)    Laryngeal stenosis 09/18/2016   Esophageal dysphagia    Polymyalgia rheumatica (HCC) 04/10/2015   Hypertrophic cardiomyopathy (HCC) 02/15/2015   Eczema 09/18/2014   Environmental allergies 09/18/2014   Pre-diabetes 11/26/2013   COPD, moderate (HCC) 11/01/2013   Complete heart block (HCC) 04/03/2011  Acute on chronic diastolic heart failure (HCC) 02/20/2011   Anemia 02/17/2011   AVM (arteriovenous malformation) of small bowel, acquired 02/13/2011   Current use of long term anticoagulation 01/15/2011   PACEMAKER-St.Jude 11/28/2010   CKD (chronic kidney disease) stage 4, GFR 15-29 ml/min (HCC) 09/24/2010   Acute on chronic congestive heart failure (HCC) 08/30/2010   ARTHRITIS 07/24/2010   Generalized anxiety disorder 07/23/2010   Hyperlipidemia 02/11/2007   Essential hypertension 02/11/2007   APNEA, SLEEP 02/11/2007   PCP:  [facility], [facility] Pharmacy:   [facility] - [place], [place] - [address] [place] [address] Phone: [phone] Fax: [phone]     Social Determinants of Health (SDOH) Social History: SDOH Screenings   Food Insecurity: No Food Insecurity (07/13/2023)  Housing: Low Risk  (07/13/2023)  Transportation Needs: No Transportation Needs (07/13/2023)  Utilities: Not At Risk (07/13/2023)  Depression (PHQ2-9): Low Risk  (05/20/2019)  Tobacco Use: Medium Risk (07/09/2023)   SDOH Interventions: Transportation Interventions: Intervention Not Indicated, Inpatient TOC, Patient Resources (Friends/Family)   Readmission Risk Interventions    07/08/2023    3:26 PM  Readmission Risk Prevention Plan  Transportation Screening Complete  PCP or Specialist Appt within 3-5 Days Complete  HRI or Home Care Consult Complete  Social Work Consult for Recovery Care Planning/Counseling Complete  Palliative Care Screening Not Applicable  Medication Review Oceanographer) Referral to Pharmacy

## 2023-07-14 NOTE — Procedures (Signed)
Patient seen and examined on Hemodialysis. The procedure was supervised and I have made appropriate changes. BP (!) 114/55   Pulse 70   Temp (!) 97.4 F (36.3 C)   Resp 17   Ht 5\' 5"  (1.651 m)   Wt 49.9 kg   SpO2 100%   BMI 18.31 kg/m   QB 300 mL/ min via AVG, UF goal even  Tolerating treatment without complaints at this time.  Getting albumin for low BP   Bufford Buttner MD [facility] [phone] 10:31 AM

## 2023-07-14 NOTE — Progress Notes (Signed)
Pt's referral for out-pt HD is still pending at this time. Will assist as needed.   Olivia Canter Renal Navigator [phone]

## 2023-07-14 NOTE — Progress Notes (Signed)
Subjective: [name] is a 77 year-old female who has a PMHx of of complete heart block s/p Abbott PPM in 1986, atrial fibrillation, HTN, aortic atherosclerosis, laryngeal stenosis s/p tracheostomy, CVA 04/2019, GI bleed 2012, AVMs, HCM/chronic congestive heart failure, ESRD s/p fistula, COPD who presented with decreased PO intake and melena found to have acute on chronic anemia and GI bleed   Having poor PO intake. Dietician involved in care. Currently receiving dialysis. Breathing well on RA   Objective: Vital signs in last 24 hours: Vitals:   07/14/23 0945 07/14/23 1000 07/14/23 1030 07/14/23 1100  BP: (!) 104/25 (!) 109/56 (!) 114/55 (!) 110/53  Pulse: 70 70 70 70  Resp: (!) 23 17 17 16   Temp:      TempSrc:      SpO2: 100% 100% 100% 100%  Weight:      Height:       Weight change:   Intake/Output Summary (Last 24 hours) at 07/14/2023 1116 Last data filed at 07/13/2023 2205 Gross per 24 hour  Intake 120 ml  Output --  Net 120 ml    Physical Exam: General appearance: frail, weak, awake and alert, NAD Eyes: conjunctivae/corneas clear. PERRL, EOM's intact Neck:  tracheostomy valve capped  Lungs: normal WOB, CTAB, no wheezing  Heart: RRR, S1, S2 normal, no m/r/g Abdomen: soft, NTND Extremities: No LE edema. LUE AV graft fistula in place Neurologic: Mildly confused   Lab Results:    Latest Ref Rng & Units 07/14/2023   12:13 AM 07/13/2023    2:56 AM 07/12/2023   12:40 AM  CBC  WBC 4.0 - 10.5 K/uL 8.6  7.6  7.6   Hemoglobin 12.0 - 15.0 g/dL 8.5  8.6  9.2   Hematocrit 36.0 - 46.0 % 28.1  29.2  30.7   Platelets 150 - 400 K/uL 151  159  132       Latest Ref Rng & Units 07/14/2023   12:13 AM 07/13/2023    2:56 AM 07/12/2023   12:40 AM  CMP  Glucose 70 - 99 mg/dL 284  132  440   BUN 8 - 23 mg/dL 44  29  11   Creatinine 0.44 - 1.00 mg/dL 1.02  7.25  3.66   Sodium 135 - 145 mmol/L 134  132  135   Potassium 3.5 - 5.1 mmol/L 4.5  3.1  3.4   Chloride 98 - 111 mmol/L  100  95  96   CO2 22 - 32 mmol/L 22  27  27    Calcium 8.9 - 10.3 mg/dL 7.9  7.8  8.0    No results for input(s): "GLUCAP" in the last 72 hours.   Micro Results: No results found for this or any previous visit (from the past 240 hour(s)).  Studies/Results: No results found.  Medications: I have reviewed the patient's current medications. Scheduled Meds:  sodium chloride   Intravenous Once   sodium chloride   Intravenous Once   apixaban  2.5 mg Oral BID   Chlorhexidine Gluconate Cloth  6 each Topical Q0600   darbepoetin (ARANESP) injection - DIALYSIS  60 mcg Subcutaneous Q Tue-1800   donepezil  10 mg Oral QHS   feeding supplement  237 mL Oral BID BM   metoprolol succinate  12.5 mg Oral Daily   midodrine       midodrine  10 mg Oral Q T,Th,Sa-HD   mirtazapine  7.5 mg Oral QHS   pantoprazole  40 mg Oral BID  rosuvastatin  40 mg Oral QHS   sucralfate  1 g Oral TID AC & HS   Continuous Infusions:  albumin human     PRN Meds:.acetaminophen **OR** acetaminophen, albumin human, midodrine, senna-docusate  Assessment/Plan: Principal Problem:   Acute on chronic anemia Active Problems:   Current use of long term anticoagulation   Tracheostomy present (HCC)   Melena   ESRD on dialysis (HCC)   Acute encephalopathy   Gastrointestinal hemorrhage   AVM (arteriovenous malformation) of duodenum, acquired with hemorrhage   AVM (arteriovenous malformation) of stomach, acquired with hemorrhage   Protein-calorie malnutrition, severe   Discharge planning issues   Failure to thrive in adult  [name] is a 77 year-old female who has a PMHx of of complete heart block s/p Abbott PPM in 1986, atrial fibrillation, HTN, aortic atherosclerosis, laryngeal stenosis s/p tracheostomy, CVA 04/2019, GI bleed 2012, AVMs, HCM/chronic congestive heart failure, ESRD s/p fistula, COPD who presented with decreased PO intake and melena found to have acute on chronic anemia and GI bleed   Active  Problems   #Melena  UGIB  H/o small bowel AVMs #Poor PO intake #Acute on chronic anemia, stable   Enteroscopy on 7/25 showed multiple angiodysplastic lesions treated with APC. Encourage increased oral intake, over last 24 hours. Hb 8.5  -Cont PO protonix 40mg  BID  -Cont Carafate TID for 2 weeks  -Cont Eliquis 2.5mg  BID  -Start Darbepoetin alfa injection 60mg  SQ every Tues  -Consider IV iron replacement -Cont feeding supplement  -Dietician following -PT/OT    #ESRD  AV graft fistula #Hyperkalemia, resolved   Left UE AV graft placement on 06/01/23. Unclear her baseline Cr but likely ~2-3. Hemodialysis schedule T/Th/Sat. Working with [facility] for outpatient HD  -Received first dose of 2-dose series HBV vaccine. Plan for 2nd dose in 1 month  -Daily BMP    #Trach collar Trach collar has been capped since 7/27 with normal sats. Discussed with [facility] PCP and declined LTACH and CIR for management of trach while in dialysis. Prefers home service with [facility] support. Discussion with daughter on 7/29 sounded like she was interested in SNF  -Continue discussion with [facility] and social work   #Dementia  Reported onset of dementia in 2021. -Continue home donepezil 10mg  daily   -Palliative care following for GOC  Chronic Problems   #Atrial fibrillation, controlled #HLD, controlled  #H/o stroke Reported stroke on 04/2019 with residual expressive aphasia. Last lipid panel showed LDL 55 (05/29/23) -Cont home rosuvastatin 40mg   -Continue metoprolol succinate 12.5mg  daily   -Cont Eliquis 2.5mg  BID    #Chronic CHF  HCM  #Complete heart block s/p PPM (1986)  Follows with HF clinic.  -Continue Metoprolol succinate  -May consider home diuretic on non-dialysis days based on volume status      #HTN, controlled -Continue antihypertensives as stated above   This is a Psychologist, occupational Note.  The care of the patient was discussed with Dr. Ned Card and the assessment and plan formulated with their  assistance.  Please see their attached note for official documentation of the daily encounter.   LOS: 7 days   Loyal Buba 07/14/2023, 11:16 AM

## 2023-07-14 NOTE — Progress Notes (Signed)
Subjective: Patient well known to me.  Tracheostomy due to history of laryngeal stenosis.  We have discussed potential decannulation in the past but she was reluctant.  I understand that other health care providers have expressed disinterest or discomfort with caring for her with a tracheostomy.  So, her trach tube has been plugged for a few days without breathing difficulty.  She is interested in proceeding with decannulation.  Objective: Vital signs in last 24 hours: Temp:  [97.4 F (36.3 C)-98.5 F (36.9 C)] 97.9 F (36.6 C) (07/30 1548) Pulse Rate:  [70-71] 70 (07/30 1548) Resp:  [16-23] 19 (07/30 1548) BP: (98-120)/(25-65) 116/62 (07/30 1548) SpO2:  [99 %-100 %] 100 % (07/30 1548) Weight:  [49.5 kg-49.9 kg] 49.5 kg (07/30 1142) Wt Readings from Last 1 Encounters:  07/14/23 49.5 kg    Intake/Output from previous day: 07/29 0701 - 07/30 0700 In: 120 [P.O.:120] Out: -  Intake/Output this shift: Total I/O In: -  Out: 500 [Other:500]  General appearance: alert, cooperative, and no distress Neck: trach tube in place with cap on tube, no stridor, good voice  Recent Labs    07/13/23 0256 07/14/23 0013  WBC 7.6 8.6  HGB 8.6* 8.5*  HCT 29.2* 28.1*  PLT 159 151    Recent Labs    07/13/23 0256 07/14/23 0013  NA 132* 134*  K 3.1* 4.5  CL 95* 100  CO2 27 22  GLUCOSE 114* 119*  BUN 29* 44*  CREATININE 3.93* 4.53*  CALCIUM 7.8* 7.9*    Medications: I have reviewed the patient's current medications.  Assessment/Plan: Laryngeal stenosis and tracheostomy status  I discussed options with her and her daughter.  I will decannulate her tomorrow and will apply a tape dressing that we will leave in place for a week.   LOS: 7 days   Christia Reading 07/14/2023, 5:19 PM

## 2023-07-14 NOTE — Progress Notes (Addendum)
Physical Therapy Treatment Patient Details Name: [name] MRN: [id number] DOB: 1945-12-30 Today's Date: 07/14/2023   History of Present Illness 77 y.o. female presents to [facility] hospital on 07/07/2023 with symptomatic anemia and FTT. Small bowel enteroscopy on 7/25. PMH includes afib, ESRD, COPD, CVA, CHF, laryngeal stenosis s/p tracheostomy, chronic GIB, dementia.    PT Comments  Pt received in supine, agreeable to therapy session and with good participation and fair tolerance for transfer training. Pt limited due to c/o bottom pain (RN notified) with hygiene assist needed during session and while seated up in chair, pt requesting pain meds. Pt needing up to +2 maxA for sit<>stand due to c/o LE fatigue and lightheadedness and pt unable to follow commands for pivotal transfers today, ultimately needing +2 to transfer to chair via Stedy lift. Pt with bowel incontinence and unaware. Recommend nursing staff implement strict turning schedule q2h given skin breakdown and pt unable to voluntarily perform her own pressure relief. Recommend pt have heels floated vs Prevalon boots to protect skin integrity on heels and promote neutral hip posture, RN notified. Pt continues to benefit from PT services to progress toward functional mobility goals.    If plan is discharge home, recommend the following: A lot of help with walking and/or transfers;A lot of help with bathing/dressing/bathroom;Assistance with cooking/housework;Assist for transportation;Help with stairs or ramp for entrance;Direct supervision/assist for medications management;Direct supervision/assist for financial management   Can travel by private vehicle        Equipment Recommendations  Other (comment) (TBD)    Recommendations for Other Services       Precautions / Restrictions Precautions Precautions: Fall Precaution Comments: trach (capped); liquid diet per SLP Restrictions Weight Bearing Restrictions: No     Mobility  Bed  Mobility Overal bed mobility: Needs Assistance Bed Mobility: Rolling, Sidelying to Sit Rolling: Min assist Sidelying to sit: Mod assist       General bed mobility comments: from slightly elevated HOB>EOB, pt cued to push away from bed/use bed rail but unable to follow through and needing HHA/trunk support modA to fully raise trunk.    Transfers Overall transfer level: Needs assistance Equipment used: Ambulation equipment used, Rolling walker (2 wheels) Transfers: Sit to/from Stand, Bed to chair/wheelchair/BSC Sit to Stand: Mod assist, Max assist, +2 physical assistance, From elevated surface           General transfer comment: STS from EOB<>RW x2 trials, pt with posterior lean and flexed trunk, unable to maintain upright without +2 maxA, so switched to [facility] as pt still needing peri care, and pt stood slightly longer in [place] frame, but still fatigues after ~10-45 seconds, impulsively returning to sitting. ModA to stand to [place] but progressed to maxA due to impulsivity/BLE fatigue. Stedy>chair Transfer via Financial trader: Stedy  Ambulation/Gait               General Gait Details: Pt not yet able to weight shift with +2 maxA at RW or in [place] due to c/o fatigue   Stairs             Wheelchair Mobility     Tilt Bed    Modified Rankin (Stroke Patients Only)       Balance Overall balance assessment: Needs assistance Sitting-balance support: No upper extremity supported, Feet supported Sitting balance-Leahy Scale: Fair Sitting balance - Comments: EOB Postural control: Posterior lean Standing balance support: Bilateral upper extremity supported, During functional activity Standing balance-Leahy Scale: Zero Standing balance comment: Poor progressing to zero  Cognition Arousal/Alertness: Awake/alert Behavior During Therapy: WFL for tasks assessed/performed Overall Cognitive Status: History of cognitive impairments - at  baseline                                 General Comments: slow processing but pt following simple commands as able; decreased carryover of safety cues within session.        Exercises Other Exercises Other Exercises: seated BLE AROM: hip flexion, LAQ x10 reps ea Other Exercises: attempted standing marches but pt unable to deweight BLE Other Exercises: STS x 6 reps (with rest breaks) for strengthening, x4 reps from EOB/Stedy flaps and x2 from EOB<>RW    General Comments General comments (skin integrity, edema, etc.): BP 106/59 (74) HR 70 bpm sitting EOB; BP 131/60 (79) in recliner HR 69 bpm; skin breakdown on bottom, RN notified and placed foam dressing pad while pt standing at bedside for peri care. Pt too fatigued to stand long enough for standing BP      Pertinent Vitals/Pain Pain Assessment Pain Assessment: PAINAD Breathing: normal Negative Vocalization: repeated troubled calling out, loud moaning/groaning, crying Facial Expression: facial grimacing Body Language: tense, distressed pacing, fidgeting Consolability: distracted or reassured by voice/touch PAINAD Score: 6 Pain Location: bottom with sitting up in chair and with peri-care Pain Descriptors / Indicators: Grimacing, Guarding, Discomfort, Moaning Pain Intervention(s): Limited activity within patient's tolerance, Monitored during session, Repositioned, Patient requesting pain meds-RN notified, Other (comment) (pillows under her hips in the chair and behind L lower back to relieve pressure from L side; RN notified pt may not tolerate sitting up >30 mins but needs new sheets placed and requesting pain meds)    Home Living                          Prior Function            PT Goals (current goals can now be found in the care plan section) Acute Rehab PT Goals Patient Stated Goal: to return to ambulation PT Goal Formulation: With patient Time For Goal Achievement: 07/24/23 Progress towards PT  goals: Progressing toward goals    Frequency    Min 3X/week      PT Plan Current plan remains appropriate    Co-evaluation              AM-PAC PT "6 Clicks" Mobility   Outcome Measure  Help needed turning from your back to your side while in a flat bed without using bedrails?: A Little Help needed moving from lying on your back to sitting on the side of a flat bed without using bedrails?: A Lot Help needed moving to and from a bed to a chair (including a wheelchair)?: Total Help needed standing up from a chair using your arms (e.g., wheelchair or bedside chair)?: Total (+2 maxA to stand to RW today) Help needed to walk in hospital room?: Total Help needed climbing 3-5 steps with a railing? : Total 6 Click Score: 9    End of Session Equipment Utilized During Treatment: Gait belt Activity Tolerance: Patient tolerated treatment well;Treatment limited secondary to medical complications (Comment);Other (comment) (soft BP sitting EOB, may be related to her c/o fatigue) Patient left: in bed;with call bell/phone within reach;with bed alarm set;with family/visitor present;Other (comment) (pt heels floated, bed in chair posture; pt defers SCDs) Nurse Communication: Mobility status PT Visit Diagnosis: Other abnormalities of gait and  mobility (R26.89);Muscle weakness (generalized) (M62.81)     Time: 3329-5188 PT Time Calculation (min) (ACUTE ONLY): 44 min  Charges:    $Therapeutic Exercise: 8-22 mins $Therapeutic Activity: 23-37 mins PT General Charges $$ ACUTE PT VISIT: 1 Visit                     Ovide Dusek P., PTA Acute Rehabilitation Services Secure Chat Preferred 9a-5:30pm Office: [phone]    Angus Palms 07/14/2023, 5:39 PM

## 2023-07-14 NOTE — Progress Notes (Signed)
Nephrology Follow-Up Consult note   Assessment/Recommendations: [name] is a/an 77 y.o. female with a past medical history significant for CKD V, CHF, GI bleeding/AVMs, DM2, afib, dementia, frailty, admitted for GI bleeding and ARF.       AKI on CKD V --> ESRD.  Family wants to pursue dialysis             - Continue HD on TTS schedule- HD today 7/30  -  ESRD CLIP; dependent on trach status; working towards decannulation            - continue with AVG, working well  -  midodrine 10mg  with HD             - pal care involved   2.  Hyperkalemia: now resolved   3.  Anemia:             - suspect GI bleed on top of anemia of renal failure  - s/p push enteroscopy             - Transfusion per primary team  - Start esa on Tuesday   4.  BMM             - PTH 425, phos at goal  - ca corrects to normal when accounting for albumin   5.  Dementia:             - on Aricept             - pal care involved   6.  Dispo: floor, trying to cap and potentially remove trach to aid in placement  7. Nutrition: albumin 2. Push protein    Bufford Buttner [facility] 07/14/2023 10:30 AM  ___________________________________________________________  CC: weakness, AMS  Interval History/Subjective: Seen in room.  No complaints.  For HD tomorrow.    Medications:  Current Facility-Administered Medications  Medication Dose Route Frequency Provider Last Rate Last Admin   0.9 %  sodium chloride infusion (Manually program via Guardrails IV Fluids)   Intravenous Once Rondel Baton, MD       0.9 %  sodium chloride infusion (Manually program via Guardrails IV Fluids)   Intravenous Once Champ Mungo, DO       acetaminophen (TYLENOL) tablet 650 mg  650 mg Oral Q6H PRN Atway, [name], DO   650 mg at 07/13/23 2303   Or   acetaminophen (TYLENOL) suppository 650 mg  650 mg Rectal Q6H PRN Atway, [name], DO       albumin human 25 % solution 25 g  25 g Intravenous Once PRN  [name], MD       apixaban Everlene Balls) tablet 2.5 mg  2.5 mg Oral BID Atway, [name], DO   2.5 mg at 07/13/23 2303   Chlorhexidine Gluconate Cloth 2 % PADS 6 each  6 each Topical Q0600 Bufford Buttner, MD   6 each at 07/13/23 0552   Darbepoetin Alfa (ARANESP) injection 60 mcg  60 mcg Subcutaneous Q Tue-1800 [name], MD       donepezil (ARICEPT) tablet 10 mg  10 mg Oral QHS Atway, [name], DO   10 mg at 07/13/23 2303   feeding supplement (ENSURE ENLIVE / ENSURE PLUS) liquid 237 mL  237 mL Oral BID BM Dickie La, MD   237 mL at 07/13/23 0946   metoprolol succinate (TOPROL-XL) 24 hr tablet 12.5 mg  12.5 mg Oral Daily Atway, [name], DO   12.5 mg at 07/13/23  0946   midodrine (PROAMATINE) 5 MG tablet            midodrine (PROAMATINE) tablet 10 mg  10 mg Oral Q T,Th,Sa-HD [name], MD   10 mg at 07/14/23 0936   mirtazapine (REMERON) tablet 7.5 mg  7.5 mg Oral QHS Atway, [name], DO   7.5 mg at 07/13/23 2304   pantoprazole (PROTONIX) EC tablet 40 mg  40 mg Oral BID Atway, [name], DO   40 mg at 07/13/23 2303   rosuvastatin (CRESTOR) tablet 40 mg  40 mg Oral QHS Atway, [name], DO   40 mg at 07/13/23 2303   senna-docusate (Senokot-S) tablet 1 tablet  1 tablet Oral QHS PRN Atway, [name], DO       sucralfate (CARAFATE) 1 GM/10ML suspension 1 g  1 g Oral TID AC & HS Atway, [name], DO   1 g at 07/13/23 2304      Review of Systems: 10 systems reviewed and negative except per interval history/subjective  Physical Exam: Vitals:   07/14/23 0945 07/14/23 1000  BP: (!) 104/25 (!) 109/56  Pulse: 70 70  Resp: (!) 23 17  Temp:    SpO2: 100% 100%   No intake/output data recorded.  Intake/Output Summary (Last 24 hours) at 07/14/2023 1030 Last data filed at 07/13/2023 2205 Gross per 24 hour  Intake 120 ml  Output --  Net 120 ml   Constitutional: Sitting up in bed, no acute distress ENMT: ears and nose without scars or lesions, MMM, trach in place  CV: normal rate,  no edema Respiratory: Bilateral chest rise with no increased work of breathing Gastrointestinal: soft, non-tender, no palpable masses or hernias Skin: no visible lesions or rashes   Test Results I personally reviewed new and old clinical labs and radiology tests Lab Results  Component Value Date   NA 134 (L) 07/14/2023   K 4.5 07/14/2023   CL 100 07/14/2023   CO2 22 07/14/2023   BUN 44 (H) 07/14/2023   CREATININE 4.53 (H) 07/14/2023   GFR 16.19 (L) 10/01/2021   GLU 96 07/28/2016   CALCIUM 7.9 (L) 07/14/2023   ALBUMIN 1.8 (L) 07/13/2023   PHOS 2.1 (L) 07/13/2023    CBC Recent Labs  Lab 07/07/23 1545 07/07/23 2031 07/09/23 0235 07/09/23 0800 07/12/23 0040 07/13/23 0256 07/14/23 0013  WBC 13.8*   < > 10.6*   < > 7.6 7.6 8.6  NEUTROABS 13.0*  --  9.2*  --   --   --   --   HGB 5.8*   < > 10.8*   < > 9.2* 8.6* 8.5*  HCT 22.9*   < > 33.5*   < > 30.7* 29.2* 28.1*  MCV 103.2*   < > 90.1   < > 91.1 91.0 90.6  PLT 304   < > 137*   < > 132* 159 151   < > = values in this interval not displayed.

## 2023-07-14 NOTE — Progress Notes (Signed)
   07/14/23 1142  Vitals  Temp 97.6 F (36.4 C)  Pulse Rate 70  Resp 17  BP (!) 120/53  SpO2 100 %  O2 Device Room Air  Weight 49.5 kg  Type of Weight Post-Dialysis  Oxygen Therapy  Patient Activity (if Appropriate) In bed  Pulse Oximetry Type Continuous  Oximetry Probe Site Changed No  Post Treatment  Dialyzer Clearance Lightly streaked  Duration of HD Treatment -hour(s) 3.5 hour(s)  Hemodialysis Intake (mL) 0 mL  Liters Processed 84  Fluid Removed (mL) 500 mL  Tolerated HD Treatment Yes  Post-Hemodialysis Comments mididrine 10mg  po given x 1---albumin 25% iv given x1  AVG/AVF Arterial Site Held (minutes) 10 minutes  AVG/AVF Venous Site Held (minutes) 10 minutes   Received patient in bed to unit.  Alert and oriented.  Informed consent signed and in chart.   TX duration:3.5   Patient tolerated well.  Transported back to the room  Alert, without acute distress.  Hand-off given to patient's nurse.   Access used: LUAF Access issues: no complications  Total UF removed: 500 Medication(s) given: mididrine 10mg  po x 1---albumin 25% iv x 1   [facility]

## 2023-07-15 DIAGNOSIS — N186 End stage renal disease: Secondary | ICD-10-CM | POA: Diagnosis not present

## 2023-07-15 DIAGNOSIS — L89152 Pressure ulcer of sacral region, stage 2: Secondary | ICD-10-CM | POA: Diagnosis present

## 2023-07-15 DIAGNOSIS — Z930 Tracheostomy status: Secondary | ICD-10-CM | POA: Diagnosis not present

## 2023-07-15 DIAGNOSIS — K921 Melena: Secondary | ICD-10-CM | POA: Diagnosis not present

## 2023-07-15 DIAGNOSIS — E43 Unspecified severe protein-calorie malnutrition: Secondary | ICD-10-CM | POA: Diagnosis not present

## 2023-07-15 MED ORDER — MAGNESIUM SULFATE 4 GM/100ML IV SOLN
4.0000 g | Freq: Once | INTRAVENOUS | Status: AC
  Administered 2023-07-15: 4 g via INTRAVENOUS
  Filled 2023-07-15: qty 100

## 2023-07-15 NOTE — Progress Notes (Signed)
Subjective: [name] is a 77 year-old female who has a PMHx of of complete heart block s/p Abbott PPM in 1986, atrial fibrillation, HTN, aortic atherosclerosis, laryngeal stenosis s/p tracheostomy, CVA 04/2019, GI bleed 2012, AVMs, HCM/chronic congestive heart failure, ESRD s/p fistula, COPD who presented with decreased PO intake and melena found to have acute on chronic anemia and GI bleed   Continues to have difficulty with oral intake. Encouraged her to drink some Ensure. Reports feeling weak but no denies SOB or pain.   Objective: Vital signs in last 24 hours: Vitals:   07/15/23 0456 07/15/23 0820 07/15/23 0842 07/15/23 0935  BP: (!) 102/52  (!) 136/94 (!) 102/49  Pulse: 69 64 72 69  Resp: 17 18 18 18   Temp: 98.1 F (36.7 C)  98.5 F (36.9 C) 98.7 F (37.1 C)  TempSrc:   Oral Oral  SpO2: 100% 95% 93% 100%  Weight:      Height:       Weight change:   Intake/Output Summary (Last 24 hours) at 07/15/2023 1115 Last data filed at 07/14/2023 1700 Gross per 24 hour  Intake 120 ml  Output 500 ml  Net -380 ml    Physical Exam: General appearance: frail, weak, awake and alert, NAD Eyes: conjunctivae/corneas clear. PERRL, EOM's intact Neck:  tracheostomy valve capped   Lungs: normal WOB Heart: RRR, S1, S2 normal, no m/r/g Abdomen: soft, NTND Extremities: No LE edema. LUE AV graft fistula in place Neurologic: Oriented to place and name, but not to time    Lab Results:    Latest Ref Rng & Units 07/15/2023    4:16 AM 07/14/2023   12:13 AM 07/13/2023    2:56 AM  CBC  WBC 4.0 - 10.5 K/uL 7.4  8.6  7.6   Hemoglobin 12.0 - 15.0 g/dL 7.8  8.5  8.6   Hematocrit 36.0 - 46.0 % 26.1  28.1  29.2   Platelets 150 - 400 K/uL 179  151  159       Latest Ref Rng & Units 07/15/2023    4:16 AM 07/14/2023   12:13 AM 07/13/2023    2:56 AM  CMP  Glucose 70 - 99 mg/dL 94  784  696   BUN 8 - 23 mg/dL 21  44  29   Creatinine 0.44 - 1.00 mg/dL 2.95  2.84  1.32   Sodium 135 - 145 mmol/L  133  134  132   Potassium 3.5 - 5.1 mmol/L 3.6  4.5  3.1   Chloride 98 - 111 mmol/L 94  100  95   CO2 22 - 32 mmol/L 28  22  27    Calcium 8.9 - 10.3 mg/dL 8.2  7.9  7.8    No results for input(s): "GLUCAP" in the last 72 hours.   Micro Results: No results found for this or any previous visit (from the past 240 hour(s)).  Studies/Results: No results found.  Medications: I have reviewed the patient's current medications. Scheduled Meds:  sodium chloride   Intravenous Once   sodium chloride   Intravenous Once   apixaban  2.5 mg Oral BID   Chlorhexidine Gluconate Cloth  6 each Topical Q0600   darbepoetin (ARANESP) injection - DIALYSIS  60 mcg Subcutaneous Q Tue-1800   donepezil  10 mg Oral QHS   feeding supplement  237 mL Oral BID BM   metoprolol succinate  12.5 mg Oral Daily   midodrine  10 mg Oral Q T,Th,Sa-HD   mirtazapine  7.5 mg Oral QHS   pantoprazole  40 mg Oral BID   rosuvastatin  40 mg Oral QHS   sucralfate  1 g Oral TID AC & HS   Continuous Infusions:  albumin human     magnesium sulfate bolus IVPB     PRN Meds:.acetaminophen **OR** acetaminophen, albumin human, senna-docusate  Assessment/Plan: Principal Problem:   Acute on chronic anemia Active Problems:   Current use of long term anticoagulation   Tracheostomy present (HCC)   Melena   ESRD on dialysis (HCC)   Acute encephalopathy   Gastrointestinal hemorrhage   AVM (arteriovenous malformation) of duodenum, acquired with hemorrhage   AVM (arteriovenous malformation) of stomach, acquired with hemorrhage   Protein-calorie malnutrition, severe   Discharge planning issues   Failure to thrive in adult  [name] is a 77 year-old female who has a PMHx of of complete heart block s/p Abbott PPM in 1986, atrial fibrillation, HTN, aortic atherosclerosis, laryngeal stenosis s/p tracheostomy, CVA 04/2019, GI bleed 2012, AVMs, HCM/chronic congestive heart failure, ESRD s/p fistula, COPD who presented with  decreased PO intake and melena found to have acute on chronic anemia and GI bleed   Active Problems   #Melena  UGIB  H/o small bowel AVMs #Poor PO intake #Acute on chronic anemia, stable   Enteroscopy on 7/25 showed multiple angiodysplastic lesions treated with APC. Encouraged to increase oral intake. Received ESA agent on 7/30. Hb downtrending now at 7.8. Asymptomatic  -Cont PO protonix 40mg  BID  -Cont Carafate TID for 2 weeks  -Cont Eliquis 2.5mg  BID  -May consider IV iron replacement -PT/OT  -SLP following, diet: Dys 2 (fine chop), thin liquid -If Hb falls below 7, consider 1u pRBC transfusion    #ESRD  AV graft fistula #Hyperkalemia, resolved   Hemodialysis schedule T/Th/Sat. Waiting to find outpatient HD clinic  -Received first dose of 2-dose series HBV vaccine. Plan for 2nd dose in 1 month  -Daily BMP    #Trach collar  H/o laryngeal stensosi  Trach collar has been capped since 7/27 with normal sats. Has been approved for SNF. ENT following with plans to decannulate given no oxygen requirement  -Plans to decannulate trach tube 7/31 -Monitor vitals   #Dementia  Reported onset of dementia in 2021. -Continue home donepezil 10mg  daily    Chronic Problems   #Atrial fibrillation, controlled #HLD, controlled  #H/o stroke Reported stroke on 04/2019 with residual expressive aphasia. Last lipid panel showed LDL 55 (05/29/23) -Cont home rosuvastatin 40mg   -Continue metoprolol succinate 12.5mg  daily   -Cont Eliquis 2.5mg  BID    #Chronic CHF  HCM  #Complete heart block s/p PPM (1986)  Follows with HF clinic.  -Continue Metoprolol succinate  -May consider home diuretic on non-dialysis days based on volume status      #HTN, controlled -Continue antihypertensives as stated above   This is a Psychologist, occupational Note.  The care of the patient was discussed with Dr. Ned Card and the assessment and plan formulated with their assistance.  Please see their attached note for official  documentation of the daily encounter.   LOS: 8 days   Loyal Buba 07/15/2023, 11:15 AM

## 2023-07-15 NOTE — Progress Notes (Addendum)
Contacted [facility] admissions this morning to provide update regarding snf placement and to request an update. Will assist as needed.   Olivia Canter Renal Navigator [phone]  Addendum at 8:54 am: Also advised [facility] of plan for pt to be decannulated today per ENT note from yesterday.

## 2023-07-15 NOTE — Progress Notes (Signed)
Nutrition Brief Note  Visited patient at bedside who's family was at bedside. RD provided update on patient's nutrition and po intake which has essentially been fairly poor. RD encouraged patient to eat/drink as much as she can so that she can continue to get stronger and make progress.   Patient's swallow was assessed by SLP yesterday who recommends she continue full liquid diet. RD secure messaged MD's team about patient wanting more solid food so that she may hopefully eat and enjoy it more. RD also expressed concern for patient likely to not meet her nutrition needs with full liquid diet and ONS alone.   Patient's diet changed to Dysphagia 2 which consists of soft, moist food.   Patient refused both Ensure Plus yesterday. She has been given one today, so far.   Family was supportive and want patient to eat more too.   Patient is likely to be decannulated soon which will be one less barrier to discharge.   RD to follow closely and continue to encourage po intake.   Labs reviewed  Meds reviewed   Leodis Rains, RDN, LDN  Clinical Nutrition

## 2023-07-15 NOTE — Progress Notes (Signed)
Occupational Therapy Treatment Patient Details Name: [name] MRN: [id number] DOB: 01/14/1946 Today's Date: 07/15/2023   History of present illness 77 y.o. female presents to [facility] hospital on 07/07/2023 with symptomatic anemia and FTT. Small bowel enteroscopy on 7/25. PMH includes afib, ESRD, COPD, CVA, CHF, laryngeal stenosis s/p tracheostomy, chronic GIB, dementia.   OT comments  Patient on bedpan upon arrival and agreeable to try [facility] since patient has not been successful having a BM. Patient required mod assist to get to bed and RW attempted to transfer to [facility] but when patient stood she would state, "wait a minute." And sit back down. Face to face technique performed with mod assist to transfer to [facility] and to assist for balance for toilet hygiene. Patient will benefit from intensive inpatient follow up therapy, >3 hours/day to continue to address bathing, dressing, and toilet transfers. Acute OT to continue to follow.    Recommendations for follow up therapy are one component of a multi-disciplinary discharge planning process, led by the attending physician.  Recommendations may be updated based on patient status, additional functional criteria and insurance authorization.    Assistance Recommended at Discharge Frequent or constant Supervision/Assistance  Patient can return home with the following  Two people to help with bathing/dressing/bathroom;Two people to help with walking and/or transfers;Assistance with cooking/housework;Help with stairs or ramp for entrance;Assist for transportation;Direct supervision/assist for financial management;Direct supervision/assist for medications management   Equipment Recommendations  Other (comment) (TBD next venue)    Recommendations for Other Services      Precautions / Restrictions Precautions Precautions: Fall Precaution Comments: trach (capped); liquid diet per SLP Restrictions Weight Bearing Restrictions: No       Mobility Bed  Mobility Overal bed mobility: Needs Assistance Bed Mobility: Rolling, Sidelying to Sit Rolling: Min assist Sidelying to sit: Mod assist       General bed mobility comments: assistance to raise trunk and scoot to EOB    Transfers Overall transfer level: Needs assistance Equipment used: Rolling walker (2 wheels), None Transfers: Sit to/from Stand, Bed to chair/wheelchair/BSC Sit to Stand: Mod assist, From elevated surface Stand pivot transfers: Mod assist         General transfer comment: attempted transfer with RW with patient unable to take steps towards BSC, face to face technique used for transfers with mod assist.     Balance Overall balance assessment: Needs assistance Sitting-balance support: No upper extremity supported, Feet supported Sitting balance-Leahy Scale: Fair Sitting balance - Comments: EOB Postural control: Posterior lean Standing balance support: Bilateral upper extremity supported, During functional activity Standing balance-Leahy Scale: Poor Standing balance comment: reliant on therapist for support                           ADL either performed or assessed with clinical judgement   ADL Overall ADL's : Needs assistance/impaired     Grooming: Supervision/safety;Set up;Sitting Grooming Details (indicate cue type and reason): on EOB                 Toilet Transfer: Moderate assistance;BSC/3in1 Toilet Transfer Details (indicate cue type and reason): stand pivot transfer to [facility], patient unable to take steps with RW Toileting- Clothing Manipulation and Hygiene: Total assistance;Sit to/from stand Toileting - Clothing Manipulation Details (indicate cue type and reason): assistance of one for balance and assistance of another to perform toilet hygiene            Extremity/Trunk Assessment  Vision       Perception     Praxis      Cognition Arousal/Alertness: Awake/alert Behavior During Therapy: WFL for tasks  assessed/performed Overall Cognitive Status: History of cognitive impairments - at baseline                                 General Comments: following commands with increased time        Exercises      Shoulder Instructions       General Comments      Pertinent Vitals/ Pain       Pain Assessment Pain Assessment: Faces Faces Pain Scale: Hurts little more Pain Location: bottom Pain Descriptors / Indicators: Grimacing, Guarding, Discomfort Pain Intervention(s): Limited activity within patient's tolerance, Monitored during session, Repositioned  Home Living                                          Prior Functioning/Environment              Frequency  Min 1X/week        Progress Toward Goals  OT Goals(current goals can now be found in the care plan section)  Progress towards OT goals: Progressing toward goals  Acute Rehab OT Goals Patient Stated Goal: get better OT Goal Formulation: With patient Time For Goal Achievement: 07/24/23 Potential to Achieve Goals: Good ADL Goals Pt Will Perform Grooming: with set-up;with supervision;sitting Pt Will Perform Upper Body Bathing: with supervision;with set-up;sitting Pt Will Perform Lower Body Bathing: with min guard assist;sit to/from stand Pt Will Perform Upper Body Dressing: with supervision;with set-up;sitting Pt Will Perform Lower Body Dressing: with min guard assist;sit to/from stand Pt Will Transfer to Toilet: with min guard assist;ambulating;bedside commode Pt Will Perform Toileting - Clothing Manipulation and hygiene: with min guard assist;sit to/from stand Additional ADL Goal #1: Pt will be S in and OOB for baisc ADLs  Plan Discharge plan remains appropriate    Co-evaluation                 AM-PAC OT "6 Clicks" Daily Activity     Outcome Measure   Help from another person eating meals?: A Little Help from another person taking care of personal grooming?: A  Little Help from another person toileting, which includes using toliet, bedpan, or urinal?: A Lot Help from another person bathing (including washing, rinsing, drying)?: A Lot Help from another person to put on and taking off regular upper body clothing?: A Little Help from another person to put on and taking off regular lower body clothing?: Total 6 Click Score: 14    End of Session Equipment Utilized During Treatment: Gait belt;Rolling walker (2 wheels)  OT Visit Diagnosis: Other abnormalities of gait and mobility (R26.89);Muscle weakness (generalized) (M62.81);Other symptoms and signs involving cognitive function   Activity Tolerance Patient tolerated treatment well   Patient Left in chair;with call bell/phone within reach;with chair alarm set   Nurse Communication Mobility status;Need for lift equipment        Time: 1000-1026 OT Time Calculation (min): 26 min  Charges: OT General Charges $OT Visit: 1 Visit OT Treatments $Self Care/Home Management : 23-37 mins  Alfonse Flavors, OTA Acute Rehabilitation Services  Office [phone]   Dewain Penning 07/15/2023, 1:36 PM

## 2023-07-15 NOTE — TOC Progression Note (Signed)
Transition of Care [facility]) - Initial/Assessment Note    Patient Details  Name: [name] MRN: [id number] Date of Birth: January 01, 1946  Transition of Care [facility]) CM/SW Contact:    Ralene Bathe, LCSW Phone Number: 07/15/2023, 4:19 PM  Clinical Narrative:                 LCSW received a call from Marylene Land, [facility] social worker, requesting an update.  Update provided.  TOC following.        Patient Goals and CMS Choice            Expected Discharge Plan and Services                                              Prior Living Arrangements/Services                       Activities of Daily Living Home Assistive Devices/Equipment: None ADL Screening (condition at time of admission) Patient's cognitive ability adequate to safely complete daily activities?: Yes Is the patient deaf or have difficulty hearing?: No Does the patient have difficulty seeing, even when wearing glasses/contacts?: No Does the patient have difficulty concentrating, remembering, or making decisions?: Yes Patient able to express need for assistance with ADLs?: Yes Does the patient have difficulty dressing or bathing?: Yes Independently performs ADLs?: No Does the patient have difficulty walking or climbing stairs?: Yes Weakness of Legs: Both Weakness of Arms/Hands: None  Permission Sought/Granted                  Emotional Assessment              Admission diagnosis:  Hyperkalemia [E87.5] Patient Active Problem List   Diagnosis Date Noted   Failure to thrive in adult 07/12/2023   Protein-calorie malnutrition, severe 07/10/2023   Discharge planning issues 07/10/2023   AVM (arteriovenous malformation) of duodenum, acquired with hemorrhage 07/09/2023   AVM (arteriovenous malformation) of stomach, acquired with hemorrhage 07/09/2023   Melena 07/08/2023   ESRD on dialysis (HCC) 07/08/2023   Acute encephalopathy 07/08/2023   Gastrointestinal hemorrhage 07/08/2023    Acute on chronic anemia 07/07/2023   AKI (acute kidney injury) (HCC) 04/23/2020   Cough with hemoptysis 08/02/2019   SOB (shortness of breath)    Atypical chest pain    Chest pain on exertion 05/15/2019   Cerebral embolism with cerebral infarction 05/09/2019   Expressive aphasia 05/08/2019   Other abnormal glucose 11/26/2018   Polypharmacy 06/25/2018   Depression with anxiety    Tracheostomy present (HCC)    Tracheostomy care (HCC)    Calculus of gallbladder without cholecystitis without obstruction 12/24/2017   Chronic cough 09/25/2017   History of colonic polyps    Polyp of transverse colon    Polyp of sigmoid colon    Moderate protein-calorie malnutrition (HCC) 06/02/2017   Chronic idiopathic constipation 03/19/2017   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   Permanent atrial fibrillation (HCC)    Laryngeal stenosis 09/18/2016   Esophageal dysphagia    Polymyalgia rheumatica (HCC) 04/10/2015   Hypertrophic cardiomyopathy (HCC) 02/15/2015   Eczema 09/18/2014   Environmental allergies 09/18/2014   Pre-diabetes 11/26/2013   COPD, moderate (HCC) 11/01/2013   Complete heart block (HCC) 04/03/2011   Acute on chronic diastolic heart failure (HCC) 02/20/2011   Anemia 02/17/2011   AVM (arteriovenous malformation) of small bowel, acquired  02/13/2011   Current use of long term anticoagulation 01/15/2011   PACEMAKER-St.Jude 11/28/2010   CKD (chronic kidney disease) stage 4, GFR 15-29 ml/min (HCC) 09/24/2010   Acute on chronic congestive heart failure (HCC) 08/30/2010   ARTHRITIS 07/24/2010   Generalized anxiety disorder 07/23/2010   Hyperlipidemia 02/11/2007   Essential hypertension 02/11/2007   APNEA, SLEEP 02/11/2007   PCP:  [facility]:   [facility] - [place], [place] - [address] [place] [address] Phone: [phone] Fax: [phone]     Social Determinants of Health (SDOH) Social  History: SDOH Screenings   Food Insecurity: No Food Insecurity (07/13/2023)  Housing: Low Risk  (07/13/2023)  Transportation Needs: No Transportation Needs (07/13/2023)  Utilities: Not At Risk (07/13/2023)  Depression (PHQ2-9): Low Risk  (05/20/2019)  Tobacco Use: Medium Risk (07/09/2023)   SDOH Interventions: Transportation Interventions: Intervention Not Indicated, Inpatient TOC, Patient Resources (Friends/Family)   Readmission Risk Interventions    07/08/2023    3:26 PM  Readmission Risk Prevention Plan  Transportation Screening Complete  PCP or Specialist Appt within 3-5 Days Complete  HRI or Home Care Consult Complete  Social Work Consult for Recovery Care Planning/Counseling Complete  Palliative Care Screening Not Applicable  Medication Review Oceanographer) Referral to Pharmacy

## 2023-07-15 NOTE — Progress Notes (Signed)
Nephrology Follow-Up Consult note   Assessment/Recommendations: [name] is a/an 77 y.o. female with a past medical history significant for CKD V, CHF, GI bleeding/AVMs, DM2, afib, dementia, frailty, admitted for GI bleeding and ARF.       AKI on CKD V --> ESRD.  Family wants to pursue dialysis             - Continue HD on TTS schedule- HD /30  -  ESRD CLIP; dependent on trach status; working towards decannulation- per notes looks like today            - continue with AVG, working well  -  midodrine 10mg  with HD             - pal care involved  - next hurdle after decannulation will be to see if she can sit up in the chair for dialysis   2.  Hyperkalemia: now resolved   3.  Anemia:             - suspect GI bleed on top of anemia of renal failure  - s/p push enteroscopy             - Transfusion per primary team  - Start esa on Tuesday   4.  BMM             - PTH 425, phos at goal  - ca corrects to normal when accounting for albumin   5.  Dementia:             - on Aricept             - pal care involved   6.  Dispo: floor, trying to cap and potentially remove trach to aid in placement  7. Nutrition: albumin 2. Push protein    Bufford Buttner [facility] 07/15/2023 9:46 AM  ___________________________________________________________  CC: weakness, AMS  Interval History/Subjective: Seen in room.  No complaints.  For HD tomorrow.    Medications:  Current Facility-Administered Medications  Medication Dose Route Frequency Provider Last Rate Last Admin   0.9 %  sodium chloride infusion (Manually program via Guardrails IV Fluids)   Intravenous Once Rondel Baton, MD       0.9 %  sodium chloride infusion (Manually program via Guardrails IV Fluids)   Intravenous Once Champ Mungo, DO       acetaminophen (TYLENOL) tablet 650 mg  650 mg Oral Q6H PRN Atway, [name], DO   650 mg at 07/14/23 1549   Or   acetaminophen (TYLENOL) suppository 650 mg   650 mg Rectal Q6H PRN Atway, [name], DO       albumin human 25 % solution 25 g  25 g Intravenous Once PRN [name], MD       apixaban Everlene Balls) tablet 2.5 mg  2.5 mg Oral BID Atway, [name], DO   2.5 mg at 07/15/23 0844   Chlorhexidine Gluconate Cloth 2 % PADS 6 each  6 each Topical Q0600 Bufford Buttner, MD   6 each at 07/15/23 4403   Darbepoetin Alfa (ARANESP) injection 60 mcg  60 mcg Subcutaneous Q Tue-1800 [name], MD   60 mcg at 07/14/23 1727   donepezil (ARICEPT) tablet 10 mg  10 mg Oral QHS Atway, [name], DO   10 mg at 07/14/23 2113   feeding supplement (ENSURE ENLIVE / ENSURE PLUS) liquid 237 mL  237 mL Oral BID BM Dickie La, MD   237 mL at  07/15/23 0846   metoprolol succinate (TOPROL-XL) 24 hr tablet 12.5 mg  12.5 mg Oral Daily Atway, [name], DO   12.5 mg at 07/15/23 0843   midodrine (PROAMATINE) tablet 10 mg  10 mg Oral Q T,Th,Sa-HD [name], MD   10 mg at 07/14/23 0936   mirtazapine (REMERON) tablet 7.5 mg  7.5 mg Oral QHS Atway, [name], DO   7.5 mg at 07/14/23 2114   pantoprazole (PROTONIX) EC tablet 40 mg  40 mg Oral BID Atway, [name], DO   40 mg at 07/15/23 0844   rosuvastatin (CRESTOR) tablet 40 mg  40 mg Oral QHS Atway, [name], DO   40 mg at 07/14/23 2115   senna-docusate (Senokot-S) tablet 1 tablet  1 tablet Oral QHS PRN Atway, [name], DO       sucralfate (CARAFATE) 1 GM/10ML suspension 1 g  1 g Oral TID AC & HS Atway, [name], DO   1 g at 07/15/23 4098      Review of Systems: 10 systems reviewed and negative except per interval history/subjective  Physical Exam: Vitals:   07/15/23 0842 07/15/23 0935  BP: (!) 136/94 (!) 102/49  Pulse: 72 69  Resp: 18 18  Temp: 98.5 F (36.9 C) 98.7 F (37.1 C)  SpO2: 93% 100%   No intake/output data recorded.  Intake/Output Summary (Last 24 hours) at 07/15/2023 0946 Last data filed at 07/14/2023 1700 Gross per 24 hour  Intake 120 ml  Output 500 ml  Net -380 ml   Constitutional:  Sitting up in bed, no acute distress ENMT: ears and nose without scars or lesions, MMM, trach in place  CV: normal rate, no edema Respiratory: Bilateral chest rise with no increased work of breathing Gastrointestinal: soft, non-tender, no palpable masses or hernias Skin: no visible lesions or rashes   Test Results I personally reviewed new and old clinical labs and radiology tests Lab Results  Component Value Date   NA 133 (L) 07/15/2023   K 3.6 07/15/2023   CL 94 (L) 07/15/2023   CO2 28 07/15/2023   BUN 21 07/15/2023   CREATININE 3.09 (H) 07/15/2023   GFR 16.19 (L) 10/01/2021   GLU 96 07/28/2016   CALCIUM 8.2 (L) 07/15/2023   ALBUMIN 1.8 (L) 07/13/2023   PHOS 2.1 (L) 07/13/2023    CBC Recent Labs  Lab 07/09/23 0235 07/09/23 0800 07/13/23 0256 07/14/23 0013 07/15/23 0416  WBC 10.6*   < > 7.6 8.6 7.4  NEUTROABS 9.2*  --   --   --   --   HGB 10.8*   < > 8.6* 8.5* 7.8*  HCT 33.5*   < > 29.2* 28.1* 26.1*  MCV 90.1   < > 91.0 90.6 91.6  PLT 137*   < > 159 151 179   < > = values in this interval not displayed.

## 2023-07-15 NOTE — Progress Notes (Addendum)
Speech Language Pathology Treatment: Dysphagia  Patient Details Name: [name] MRN: [id number] DOB: 1946/02/16 Today's Date: 07/15/2023 Time: 8295-6213 SLP Time Calculation (min) (ACUTE ONLY): 16 min  Assessment / Plan / Recommendation Clinical Impression  Pt seen for skilled observation of upgraded textures to determine readiness for more solid POs. She reports typically eating solids PTA with no difficulties, including meats and crunchy textures. Pt observed with trials of thin liquids via straw with no overt s/s of aspiration and no signs of dysphagia compared to evaluation yesterday. Pt opted to try peaches in syrup over a graham cracker with delayed bolus formation noted. Pt did have to spit out bolus x1 after taking a large bite. Pt appears to have increased WOB and fatigue with larger bolus sizes and more advanced consistencies. Suspect this may impact her ability to consume a regular diet, especially if more drowsy/fatigued after HD sessions; however, pt exhibits ability to tolerate soft solids. Recommend initiating diet of Dys 2 textures with thin liquids with intermittent supervision. Pt reports she occasionally self-feeds, but may benefit from increased assistance and supervision with feeding. Will continue to follow to assess ability to upgrade diet.    HPI HPI: Patient is a 77 y.o. female with PMH: dementia, complete heart block s/p Abbott PPM in 1986, atrial fibrillation, HTN, aortic atherosclerosis, laryngeal stenosis s/p tracheostomy (2017), CVA 04/2019, GI bleed 2012, AVMs, HCM/chronic congestive heart failure, ESRD s/p fistula, COPD who presented to the hospital on 07/07/23 with decreased PO intake and melena found to have acute on chronic anemia and GI bleed. She was started on a full liquids diet on 07/09/23, Her trach was capped on 7/27. On 7/29, RD contacted MD due to patient wanting solid food and so SLP swallow evaluation was ordered.      SLP Plan  Continue with current  plan of care      Recommendations for follow up therapy are one component of a multi-disciplinary discharge planning process, led by the attending physician.  Recommendations may be updated based on patient status, additional functional criteria and insurance authorization.    Recommendations  Diet recommendations: Dysphagia 2 (fine chop);Thin liquid Liquids provided via: Cup;Straw Medication Administration: Whole meds with liquid (in purees PRN) Supervision: Staff to assist with self feeding;Intermittent supervision to cue for compensatory strategies Compensations: Slow rate;Small sips/bites Postural Changes and/or Swallow Maneuvers: Seated upright 90 degrees;Upright 30-60 min after meal                  Oral care BID   Frequent or constant Supervision/Assistance Dysphagia, unspecified (R13.10)     Continue with current plan of care     Gwynneth Aliment, M.A., CF-SLP Speech Language Pathology, Acute Rehabilitation Services  Secure Chat preferred [phone]   07/15/2023, 11:07 AM

## 2023-07-16 DIAGNOSIS — R627 Adult failure to thrive: Secondary | ICD-10-CM | POA: Diagnosis not present

## 2023-07-16 DIAGNOSIS — Z992 Dependence on renal dialysis: Secondary | ICD-10-CM | POA: Diagnosis not present

## 2023-07-16 DIAGNOSIS — Z930 Tracheostomy status: Secondary | ICD-10-CM | POA: Diagnosis not present

## 2023-07-16 DIAGNOSIS — N186 End stage renal disease: Secondary | ICD-10-CM | POA: Diagnosis not present

## 2023-07-16 DIAGNOSIS — D649 Anemia, unspecified: Secondary | ICD-10-CM | POA: Diagnosis not present

## 2023-07-16 LAB — CBC
HCT: 23.8 % — ABNORMAL LOW (ref 36.0–46.0)
Hemoglobin: 7.3 g/dL — ABNORMAL LOW (ref 12.0–15.0)
MCH: 28.1 pg (ref 26.0–34.0)
MCHC: 30.7 g/dL (ref 30.0–36.0)
MCV: 91.5 fL (ref 80.0–100.0)
Platelets: 173 10*3/uL (ref 150–400)
RBC: 2.6 MIL/uL — ABNORMAL LOW (ref 3.87–5.11)
RDW: 15.5 % (ref 11.5–15.5)
WBC: 7.8 10*3/uL (ref 4.0–10.5)
nRBC: 0 % (ref 0.0–0.2)

## 2023-07-16 NOTE — Progress Notes (Signed)
PT Cancellation Note  Patient Details Name: [name] MRN: [id number] DOB: Mar 04, 1946   Cancelled Treatment:    Reason Eval/Treat Not Completed: (P) Other (comment) (Attempted 12:04pm however pt had just begun eating with assist from RN, needs full Supervision/assist to eat so defer until later in day. Reattempt 1740 however pt at HD dept.)   Angus Palms 07/16/2023, 6:35 PM

## 2023-07-16 NOTE — Plan of Care (Signed)

## 2023-07-16 NOTE — Progress Notes (Signed)
[facility] Progress Note   Assessment/ Plan:   [name] is a/an 77 y.o. female with a past medical history significant for CKD V, CHF, GI bleeding/AVMs, DM2, afib, dementia, frailty, admitted for GI bleeding and ARF.          AKI on CKD V --> ESRD.  Family wants to pursue dialysis             - Continue HD on TTS schedule- HD /30             -  ESRD CLIP  - decannulated 07/15/23             - continue with AVG, working well             -  midodrine 10mg  with HD             - pal care involved             - needs to sit in chair for HD   2.  Hyperkalemia: now resolved   3.  Anemia:             - suspect GI bleed on top of anemia of renal failure             - s/p push enteroscopy             - Transfusion per primary team             - Start esa on Tuesday   4.  BMM             - PTH 425, phos at goal             - ca corrects to normal when accounting for albumin   5.  Dementia:             - on Aricept             - pal care involved   6.  Dispo: floor   7. Nutrition: albumin 2. Push protein  Subjective:    Decannulated yesterday, doing well.  For HD today.  Up in the chair   Objective:   BP 131/64 (BP Location: Right Arm)   Pulse 72   Temp 97.7 F (36.5 C) (Oral)   Resp 18   Ht 5\' 5"  (1.651 m)   Wt 49.5 kg   SpO2 97%   BMI 18.16 kg/m   Physical Exam: Gen:NAD, sitting in chair CVS: RRR Resp: clear, trach site bandaged Abd: soft Ext: no LE edema ACCESS: AVG  Labs: BMET Recent Labs  Lab 07/10/23 0231 07/11/23 0257 07/12/23 0040 07/13/23 0256 07/14/23 0013 07/15/23 0416 07/16/23 0256  NA 134* 135 135 132* 134* 133* 129*  K 3.2* 3.3* 3.4* 3.1* 4.5 3.6 3.7  CL 98 95* 96* 95* 100 94* 92*  CO2 26 24 27 27 22 28 25   GLUCOSE 94 103* 113* 114* 119* 94 112*  BUN 17 28* 11 29* 44* 21 36*  CREATININE 2.76* 4.22* 2.45* 3.93* 4.53* 3.09* 4.39*  CALCIUM 7.8* 8.2* 8.0* 7.8* 7.9* 8.2* 8.0*  PHOS 3.7 4.6 1.9* 2.1*  --   --   --     CBC Recent Labs  Lab 07/13/23 0256 07/14/23 0013 07/15/23 0416 07/16/23 0536  WBC 7.6 8.6 7.4 7.8  HGB 8.6* 8.5* 7.8* 7.3*  HCT 29.2* 28.1* 26.1* 23.8*  MCV 91.0 90.6 91.6 91.5  PLT 159 151 179 173  Medications:     sodium chloride   Intravenous Once   sodium chloride   Intravenous Once   apixaban  2.5 mg Oral BID   Chlorhexidine Gluconate Cloth  6 each Topical Q0600   darbepoetin (ARANESP) injection - DIALYSIS  60 mcg Subcutaneous Q Tue-1800   donepezil  10 mg Oral QHS   feeding supplement  237 mL Oral BID BM   metoprolol succinate  12.5 mg Oral Daily   midodrine  10 mg Oral Q T,Th,Sa-HD   mirtazapine  7.5 mg Oral QHS   pantoprazole  40 mg Oral BID   rosuvastatin  40 mg Oral QHS   sucralfate  1 g Oral TID AC & HS     Bufford Buttner, MD 07/16/2023, 11:50 AM

## 2023-07-16 NOTE — Progress Notes (Signed)
Patient ID: [name], female   DOB: [birth year], 77 y.o.   MRN: [id number]  Decannulated tracheostomy tube and applied tape dressing.  Instructed her to apply finger pressure to the center of her dressing when talking or coughing.  Discussed with nursing also.  Will plan to leave dressing in place for one week.

## 2023-07-16 NOTE — Progress Notes (Signed)
Documentation provided to [facility] this morning regarding pt's decannulation. Pt will have to be able to sit for HD in order to be appropriate for out-pt HD. Plan is for pt to trial HD in chair prior to d/c to confirm pt is able to tolerate. Case discussed with nephrologist yesterday. Will assist as needed.   Olivia Canter Renal Navigator [phone]

## 2023-07-16 NOTE — Progress Notes (Signed)
Patient sat up in room recliner for four hours this morning. Plan for patient to sit in dialysis chair for HD on 8/3.

## 2023-07-16 NOTE — Progress Notes (Signed)
   07/16/23 1840  Vitals  Temp 97.8 F (36.6 C)  Pulse Rate 70  Resp 18  BP (!) 105/49  SpO2 100 %  O2 Device Room Air  Weight 48.1 kg  Type of Weight Post-Dialysis  Post Treatment  Dialyzer Clearance Clear  Duration of HD Treatment -hour(s) 3.25 hour(s)  Hemodialysis Intake (mL) 0 mL  Liters Processed 58.5  Fluid Removed (mL) 200 mL  Tolerated HD Treatment Yes  AVG/AVF Arterial Site Held (minutes) 8 minutes  AVG/AVF Venous Site Held (minutes) 8 minutes   Received patient in bed to unit.  Alert and oriented.  Informed consent signed and in chart.   TX duration:3.25hrs  Patient tolerated well.  Transported back to the room  Alert, without acute distress.  Hand-off given to patient's nurse.   Access used: LAVG Access issues: none  Total UF removed: Medication(s) given: none    [name]

## 2023-07-16 NOTE — Progress Notes (Signed)
Patient ID: [name], female   DOB: 1946-09-29, 77 y.o.   MRN: [id number]    Progress Note from the Palliative Medicine Team at [facility]   Patient Name: [name]        Date: 07/16/2023 DOB: [birth year]  Age: 77 y.o. MRN#: [id number] Attending Physician: Gust Rung, DO Primary Care Physician: [facility] Admit Date: 07/07/2023    Extensive chart review has been completed prior to meeting with patient/family  including labs, vital signs, imaging, progress/consult notes, orders, medications and available advance directive documents.   77 year old female with past medical history significant for complete heart block status post Abbott PPM in 1986, atrial fibrillation, hypertension, aortic atherosclerosis, laryngeal stenosis status post tracheostomy, CVA 5 of 2022, GI bleed 2012, AVM's, CHF, end-stage renal disease status post fistula--dialysis initiated this hospitalization, COPD, dementia.  Admitted for increased weakness and confusion over the last few days.   Admitted for treatment and stabilization   Family face ongoing  treatment option decisions, advanced directive decisions and anticipatory care needs.  This NP assessed patient at the bedside as a follow up for palliative medicine needs and emotional support. Ongoing conversation with patient's daughter regarding current medical situation patient's multiple comorbidities.  [name] has been de-cannulated and patient is trailing sitting up in chair for OP dialysis   Goals are clearly set.  Family is open to all offered and available medical interventions to prolong life.  Patient is a member of [facility]  Education offered today regarding  the importance of continued conversation with family and their  medical providers regarding overall plan of care and treatment options,  ensuring decisions are within the context of the patients values and GOCs.   Stressed the importance of revisiting  advance care planning documents and wishes, especially when a person's medical condition/situation changes   PMT will sign off at this time please reconsult if needed in the future  Questions and concerns addressed     Time: 25 minutes  Detailed review of medical records ( labs, imaging, vital signs), medically appropriate exam ( MS, skin, resp)   discussed with treatment team, counseling and education to patient, family, staff, documenting clinical information, medication management, coordination of care    Lorinda Creed NP  Palliative Medicine Team Team Phone # [phone] Pager [phone]

## 2023-07-16 NOTE — Progress Notes (Signed)
Subjective: [name] is a 77 year-old female who has a PMHx of of complete heart block s/p Abbott PPM in 1986, atrial fibrillation, HTN, aortic atherosclerosis, laryngeal stenosis s/p tracheostomy, CVA 04/2019, GI bleed 2012, AVMs, HCM/chronic congestive heart failure, ESRD s/p fistula, COPD who presented with decreased PO intake and melena found to have acute on chronic anemia and GI bleed   Decannulated tracheostomy tube and informed by ENT to apply finger to center of dressing when talking or coughing. She continues to have poor PO intake and has been informed by dietician to add solid foods. She is sitting down with legs laid up and reported pain at her lower extremities. She is planned to do trial for HD in chair prior to discharge to confirm is she would be able to tolerate outpatient HD. Her pain relieved after laying her legs on the ground   Objective: Vital signs in last 24 hours: Vitals:   07/16/23 0452 07/16/23 0808 07/16/23 0837 07/16/23 0912  BP: (!) 93/50 103/63  131/64  Pulse: 70 70 70 69  Resp: 17 16 20 16   Temp: 98.1 F (36.7 C) 97.8 F (36.6 C)  97.7 F (36.5 C)  TempSrc:  Oral  Oral  SpO2: 100% 100% 100% 99%  Weight:      Height:       Weight change:   Intake/Output Summary (Last 24 hours) at 07/16/2023 1137 Last data filed at 07/16/2023 0800 Gross per 24 hour  Intake 220 ml  Output 0 ml  Net 220 ml    Physical Exam: General appearance: frail, mild distress, alert  Eyes: conjunctivae/corneas clear. PERRL, EOM's intact Neck:  tracheostomy valve removed Lungs: CTAB, normal WOB  Heart: RRR, S1, S2 normal, no m/r/g Abdomen: soft, NTND Extremities: No LE edema. LUE AV graft fistula in place Neurologic: Oriented to place and name, but not to time    Lab Results:    Latest Ref Rng & Units 07/16/2023    5:36 AM 07/15/2023    4:16 AM 07/14/2023   12:13 AM  CBC  WBC 4.0 - 10.5 K/uL 7.8  7.4  8.6   Hemoglobin 12.0 - 15.0 g/dL 7.3  7.8  8.5   Hematocrit  36.0 - 46.0 % 23.8  26.1  28.1   Platelets 150 - 400 K/uL 173  179  151       Latest Ref Rng & Units 07/16/2023    2:56 AM 07/15/2023    4:16 AM 07/14/2023   12:13 AM  CMP  Glucose 70 - 99 mg/dL 086  94  578   BUN 8 - 23 mg/dL 36  21  44   Creatinine 0.44 - 1.00 mg/dL 4.69  6.29  5.28   Sodium 135 - 145 mmol/L 129  133  134   Potassium 3.5 - 5.1 mmol/L 3.7  3.6  4.5   Chloride 98 - 111 mmol/L 92  94  100   CO2 22 - 32 mmol/L 25  28  22    Calcium 8.9 - 10.3 mg/dL 8.0  8.2  7.9    No results for input(s): "GLUCAP" in the last 72 hours.  Micro Results: No results found for this or any previous visit (from the past 240 hour(s)).  Studies/Results: No results found.  Medications: I have reviewed the patient's current medications. Scheduled Meds:  sodium chloride   Intravenous Once   sodium chloride   Intravenous Once   apixaban  2.5 mg Oral BID   Chlorhexidine Gluconate  Cloth  6 each Topical Q0600   darbepoetin (ARANESP) injection - DIALYSIS  60 mcg Subcutaneous Q Tue-1800   donepezil  10 mg Oral QHS   feeding supplement  237 mL Oral BID BM   metoprolol succinate  12.5 mg Oral Daily   midodrine  10 mg Oral Q T,Th,Sa-HD   mirtazapine  7.5 mg Oral QHS   pantoprazole  40 mg Oral BID   rosuvastatin  40 mg Oral QHS   sucralfate  1 g Oral TID AC & HS   Continuous Infusions:  albumin human     PRN Meds:.acetaminophen **OR** acetaminophen, albumin human, senna-docusate  Assessment/Plan: Principal Problem:   Acute on chronic anemia Active Problems:   Current use of long term anticoagulation   Tracheostomy present (HCC)   Melena   ESRD on dialysis (HCC)   Acute encephalopathy   Gastrointestinal hemorrhage   AVM (arteriovenous malformation) of duodenum, acquired with hemorrhage   AVM (arteriovenous malformation) of stomach, acquired with hemorrhage   Protein-calorie malnutrition, severe   Discharge planning issues   Failure to thrive in adult  [name] is a 77  year-old female who has a PMHx of of complete heart block s/p Abbott PPM in 1986, atrial fibrillation, HTN, aortic atherosclerosis, laryngeal stenosis s/p tracheostomy, CVA 04/2019, GI bleed 2012, AVMs, HCM/chronic congestive heart failure, ESRD s/p fistula, COPD who presented with decreased PO intake and melena found to have acute on chronic anemia and GI bleed   Active Problems   #Poor PO intake Presented with poor PO intake and continues to have difficulty with increasing intake. Registered dietician following with encouragement to drink Ensure and eat soft, most foods. Intake over last few days has been no more than . May need to consider tube feed which will pose additional barrier to discharge     -Dietician following -Encourage PO intake  -Feeding supplement  -Monitor daily weights  -Consider involving palliative care for GOC if worsening   #Melena  UGIB  H/o small bowel AVMs #Acute on chronic anemia, stable   Enteroscopy on 7/25 showed multiple angiodysplastic lesions treated with APC. Received ESA agent on 7/30 which can take a few weeks to have a full effect. Hb 7.3 . Of note, since restarting her low-dose Eliquis on 7/27 her Hb has steadily declined    -Cont PO protonix 40mg  BID  -Cont Carafate TID for 2 weeks  -Cont Eliquis 2.5mg  BID  -PT/OT  -If Hb falls below 7, consider 1u pRBC transfusion    #ESRD  AV graft fistula #Hyperkalemia, resolved   #Hypermagnesia  Hemodialysis schedule T/Th/Sat. Planned for dialysis today. Monitor if patient can tolerate dialysis in chair for outpatient HD considerations   -Dialysis  -Received first dose of 2-dose series HBV vaccine. Plan for 2nd dose in 1 month  -Daily BMP    #Trach collar (removed)  H/o laryngeal stenosis  Decannulated tracheostomy tube on 8/1. She has normal O2 sats    -Monitor vitals  -SLP following   #Dementia  Reported onset of dementia in 2021. -Continue home donepezil 10mg  daily    Chronic Problems    #Atrial fibrillation, controlled #HLD, controlled  #H/o stroke Reported stroke on 04/2019 with residual expressive aphasia. Last lipid panel showed LDL 55 (05/29/23) -Cont home rosuvastatin 40mg   -Cont metoprolol succinate 12.5mg  daily   -Cont Eliquis 2.5mg  BID    #Chronic CHF  HCM  #Complete heart block s/p PPM (1986)  Follows with HF clinic.   -Continue metoprolol succinate 12.5mg  daily  -  May consider home diuretic on non-dialysis days based on volume status      #HTN, controlled -Continue to monitor  This is a Psychologist, occupational Note.  The care of the patient was discussed with Dr. Ned Card and the assessment and plan formulated with their assistance.  Please see their attached note for official documentation of the daily encounter.   LOS: 9 days   Loyal Buba 07/16/2023, 11:37 AM

## 2023-07-16 DEATH — deceased

## 2023-07-17 DIAGNOSIS — R627 Adult failure to thrive: Secondary | ICD-10-CM | POA: Diagnosis not present

## 2023-07-17 DIAGNOSIS — N186 End stage renal disease: Secondary | ICD-10-CM | POA: Diagnosis not present

## 2023-07-17 DIAGNOSIS — D649 Anemia, unspecified: Secondary | ICD-10-CM | POA: Diagnosis not present

## 2023-07-17 DIAGNOSIS — Z992 Dependence on renal dialysis: Secondary | ICD-10-CM | POA: Diagnosis not present

## 2023-07-17 LAB — BPAM RBC
Blood Product Expiration Date: 202409052359
ISSUE DATE / TIME: 202408022255
Unit Type and Rh: 5100

## 2023-07-17 LAB — HEMOGLOBIN AND HEMATOCRIT, BLOOD
HCT: 23.1 % — ABNORMAL LOW (ref 36.0–46.0)
Hemoglobin: 6.7 g/dL — CL (ref 12.0–15.0)

## 2023-07-17 LAB — PREPARE RBC (CROSSMATCH)

## 2023-07-17 LAB — TYPE AND SCREEN
ABO/RH(D): O POS
Antibody Screen: NEGATIVE
Unit division: 0

## 2023-07-17 LAB — OCCULT BLOOD X 1 CARD TO LAB, STOOL: Fecal Occult Bld: POSITIVE — AB

## 2023-07-17 LAB — GLUCOSE, CAPILLARY: Glucose-Capillary: 191 mg/dL — ABNORMAL HIGH (ref 70–99)

## 2023-07-17 MED ORDER — SODIUM CHLORIDE 0.9% IV SOLUTION: Freq: Once | INTRAVENOUS | Status: AC

## 2023-07-17 NOTE — Progress Notes (Signed)
Contacted [facility]) to discuss pt's referral status and steps going forward. Pt is being considered for a TTS 2nd shift appt. Clinic advised that pt was decannulated yesterday and to attempt HD in chair prior to d/c to ensure pt can tolerate. Clinic is requesting that pt complete HD in chair multiple times (2-3 times) prior to d/c to ensure pt appropriate for out-pt HD. Clinic also advised that pt will require hoyer lift from w/c to HD chair at d/c. Update provided to treatment team. Clinic is unable to provide final approval/acceptance and final schedule until pt is able to tolerate HD in chair. Will assist as needed.   Olivia Canter Renal Navigator [phone]

## 2023-07-17 NOTE — Progress Notes (Signed)
[facility] Progress Note   Assessment/ Plan:   [name] is a/an 77 y.o. female with a past medical history significant for CKD V, CHF, GI bleeding/AVMs, DM2, afib, dementia, frailty, admitted for GI bleeding and ARF.          AKI on CKD V --> ESRD.  Family wants to pursue dialysis             - Continue HD on TTS schedule- HD /30             -  ESRD CLIP  - decannulated 07/15/23             - continue with AVG, working well             -  midodrine 10mg  with HD             - pal care involved             - needs to sit in chair for HD- next HD Saturday   2.  Hyperkalemia: now resolved   3.  Anemia:             - suspect GI bleed on top of anemia of renal failure             - s/p push enteroscopy             - Transfusion per primary team             - Start esa on Tuesday   4.  BMM             - PTH 425, phos at goal             - ca corrects to normal when accounting for albumin   5.  Dementia:             - on Aricept             - pal care involved   6.  Dispo: floor   7. Nutrition: albumin 2. Push protein  Subjective:    HD yesterday.  Asking to get up to the chair this AM.     Objective:   BP 95/81 (BP Location: Right Arm)   Pulse 69   Temp 98.2 F (36.8 C)   Resp 18   Ht 5\' 5"  (1.651 m)   Wt 48.1 kg   SpO2 100%   BMI 17.65 kg/m   Physical Exam: Gen:NAD, sitting in bed CVS: RRR Resp: clear, trach site bandaged Abd: soft Ext: no LE edema ACCESS: AVG  Labs: BMET Recent Labs  Lab 07/11/23 0257 07/12/23 0040 07/13/23 0256 07/14/23 0013 07/15/23 0416 07/16/23 0256 07/17/23 0903  NA 135 135 132* 134* 133* 129* 134*  K 3.3* 3.4* 3.1* 4.5 3.6 3.7 4.2  CL 95* 96* 95* 100 94* 92* 94*  CO2 24 27 27 22 28 25 26   GLUCOSE 103* 113* 114* 119* 94 112* 109*  BUN 28* 11 29* 44* 21 36* 32*  CREATININE 4.22* 2.45* 3.93* 4.53* 3.09* 4.39* 3.37*  CALCIUM 8.2* 8.0* 7.8* 7.9* 8.2* 8.0* 8.1*  PHOS 4.6 1.9* 2.1*  --   --   --   --     CBC Recent Labs  Lab 07/14/23 0013 07/15/23 0416 07/16/23 0536 07/17/23 0903  WBC 8.6 7.4 7.8 9.3  HGB 8.5* 7.8* 7.3* 7.4*  HCT 28.1* 26.1* 23.8* 24.4*  MCV 90.6 91.6 91.5 91.7  PLT 151 179 173 232  Medications:     sodium chloride   Intravenous Once   sodium chloride   Intravenous Once   apixaban  2.5 mg Oral BID   Chlorhexidine Gluconate Cloth  6 each Topical Q0600   darbepoetin (ARANESP) injection - DIALYSIS  60 mcg Subcutaneous Q Tue-1800   donepezil  10 mg Oral QHS   feeding supplement  237 mL Oral BID BM   metoprolol succinate  12.5 mg Oral Daily   midodrine  10 mg Oral Q T,Th,Sa-HD   mirtazapine  7.5 mg Oral QHS   pantoprazole  40 mg Oral BID   rosuvastatin  40 mg Oral QHS   sucralfate  1 g Oral TID AC & HS     Bufford Buttner, MD 07/17/2023, 1:40 PM

## 2023-07-17 NOTE — Progress Notes (Signed)
Patient ID: [name], female   DOB: [birth year], 77 y.o.   MRN: [id number]  Tracheostomy dressing in place.  Reinforced pressing on middle of dressing with talking or coughing.  Will see her early next week.  Try to leave dressing in place until then.

## 2023-07-17 NOTE — Progress Notes (Signed)
Physical Therapy Treatment Patient Details Name: [name] MRN: [id number] DOB: [birth year] Today's Date: 07/17/2023   History of Present Illness 77 y.o. female presents to [facility] hospital on 06/22/2023 with symptomatic anemia and FTT. Small bowel enteroscopy on 7/25. Tracheostomy decannulated 8/1. PMH includes afib, ESRD, COPD, CVA, CHF, laryngeal stenosis s/p tracheostomy 2017, chronic GIB, dementia.    PT Comments  Pt received in supine, agreeable to therapy session with encouragement, pt daughter present and very supportive. Pt requesting OOB to [facility] and had successful BM, RN/MD notified her stools were very dark in color and pt had near-syncopal episode on BSC after standing ~30-40 seconds for hygiene assist in [place] and had to sit back down. Pt needing +2 maxA for transfers to/from [place] due to fatigue vs orthostatic hypotension and had difficulty keeping her trunk upright in [place] seat. Pt agreeable to sit in recliner to build sitting tolerance at end of session. Given symptomatic drop in BP, recommend TED hose, RN/MD notified, as well as prevalon boots while in bed for pressure relief. Pt may also benefit from geomat cushion for her chair as she will need to sit 4 hours for OP hemodialysis and is frequently uncomfortable. Pt continues to benefit from PT services to progress toward functional mobility goals.     If plan is discharge home, recommend the following: A lot of help with bathing/dressing/bathroom;Assistance with cooking/housework;Assist for transportation;Help with stairs or ramp for entrance;Direct supervision/assist for medications management;Direct supervision/assist for financial management;Two people to help with walking and/or transfers   Can travel by private vehicle        Equipment Recommendations  Other (comment) (currently at hoyer/wheelchair level)    Recommendations for Other Services       Precautions / Restrictions Precautions Precautions: Fall Precaution  Comments: Trach decannulated 8/1 Restrictions Weight Bearing Restrictions: No     Mobility  Bed Mobility Overal bed mobility: Needs Assistance Bed Mobility: Rolling, Sidelying to Sit Rolling: Min assist Sidelying to sit: Mod assist       General bed mobility comments: assistance to raise trunk and scoot to EOB    Transfers Overall transfer level: Needs assistance Equipment used: 2 person hand held assist, Ambulation equipment used Transfers: Sit to/from Stand, Bed to chair/wheelchair/BSC Sit to Stand: Mod assist, From elevated surface, +2 safety/equipment, +2 physical assistance, Max assist Stand pivot transfers: Mod assist, +2 safety/equipment, From elevated surface         General transfer comment: Face to face technique used for transfer to [facility] with mod assist. Pt with c/o dizziness and near-syncopal episode on [facility] after toileting, so utilized Stedy for sit<>stand x2 from [facility] during peri-care and to safely transfer from [employer]. Pt slumping forward on Stedy, needing +2 for safety, unclear if due to fatigue or soft BP. RN/MD notified pt may benefit from TED hose given symptoms and soft BP sitting with feet down. Unable to assess standing BP due to pt weakness and inability to stand upright >30 seconds. Transfer via Lift Equipment: Stedy  Ambulation/Gait               General Gait Details: Pt not yet able to weight shift in [place] due to c/o fatigue and near-syncopal episode; face to face stand pivot (no steps taken) vs [employer] Mobility     Tilt Bed    Modified Rankin (Stroke Patients Only)       Balance Overall balance assessment:  Needs assistance Sitting-balance support: Feet supported, Single extremity supported Sitting balance-Leahy Scale: Fair (fair to poor) Sitting balance - Comments: needs to hold rail to maintain upright at EOB and intermittent minA Postural control: Posterior lean Standing balance support:  Bilateral upper extremity supported, During functional activity Standing balance-Leahy Scale: Poor Standing balance comment: +2 to maintain upright 30 seconds in Stedy, pt only stands ~10 seconds with face to face transfers prior to sitting impulsively                            Cognition Arousal/Alertness: Awake/alert Behavior During Therapy: WFL for tasks assessed/performed Overall Cognitive Status: History of cognitive impairments - at baseline                                 General Comments: Following commands with increased time. Pt tearful with fatigue and near-syncopal episode on BSC. Daughter present and encouraging her, eager to assist.        Exercises Other Exercises Other Exercises: seated BLE AAROM: hip flexion, LAQ x10 reps ea Other Exercises: Defer standing exercises due to near-syncopal episode with standing for peri-care after using BSC.    General Comments General comments (skin integrity, edema, etc.): BP 99/50 (64 ) sitting on BSC HR 69 bpm, pt beginning to slump forward on BSC after reading taken and c/o lightheadedness; SpO2 99% on RA. BP 117/51 (69) reclined in chair, HR 57 bpm. RN/MD notified of pt near-syncopal episode on BSC and unable to assess standing BP due to pt fatigue. Recommend TED hose for hemodynamic stability when standing.      Pertinent Vitals/Pain Pain Assessment Pain Assessment: Faces Faces Pain Scale: Hurts even more Pain Location: bottom with peri-care Pain Descriptors / Indicators: Grimacing, Guarding, Discomfort Pain Intervention(s): Limited activity within patient's tolerance, Monitored during session, Repositioned     PT Goals (current goals can now be found in the care plan section) Acute Rehab PT Goals Patient Stated Goal: to return to ambulation per daughter PT Goal Formulation: With patient Time For Goal Achievement: 07/24/23 Progress towards PT goals: Progressing toward goals    Frequency    Min  3X/week      PT Plan Current plan remains appropriate       AM-PAC PT "6 Clicks" Mobility   Outcome Measure  Help needed turning from your back to your side while in a flat bed without using bedrails?: A Little Help needed moving from lying on your back to sitting on the side of a flat bed without using bedrails?: A Lot Help needed moving to and from a bed to a chair (including a wheelchair)?: Total Help needed standing up from a chair using your arms (e.g., wheelchair or bedside chair)?: Total (+1-2 maxA with Stedy lift) Help needed to walk in hospital room?: Total Help needed climbing 3-5 steps with a railing? : Total 6 Click Score: 9    End of Session Equipment Utilized During Treatment: Gait belt Activity Tolerance: Patient tolerated treatment well;Treatment limited secondary to medical complications (Comment);Other (comment);Patient limited by fatigue (symptomatic drop in BP and fatigue with BSC use) Patient left: in chair;with call bell/phone within reach;with chair alarm set;Other (comment) (pillows under her hips and LE for comfort as pt c/o bottom pain.) Nurse Communication: Mobility status;Need for lift equipment;Other (comment) (+2 Stedy vs hoyer for return from chair>bed, pt with orthostatic symptoms needs TED hose and prevalon  boots for pressure relief in bed/recliner) PT Visit Diagnosis: Other abnormalities of gait and mobility (R26.89);Muscle weakness (generalized) (M62.81)     Time: 1431-1510 PT Time Calculation (min) (ACUTE ONLY): 39 min  Charges:    $Therapeutic Activity: 38-52 mins PT General Charges $$ ACUTE PT VISIT: 1 Visit                     Benjie Ricketson P., PTA Acute Rehabilitation Services Secure Chat Preferred 9a-5:30pm Office: [phone]    Dorathy Kinsman [facility] 07/17/2023, 6:51 PM

## 2023-07-17 NOTE — Plan of Care (Signed)
  Problem: Clinical Measurements: Goal: Ability to maintain clinical measurements within normal limits will improve Outcome: Progressing Goal: Will remain free from infection Outcome: Progressing Goal: Diagnostic test results will improve Outcome: Progressing Goal: Respiratory complications will improve Outcome: Progressing Goal: Cardiovascular complication will be avoided Outcome: Progressing   Problem: Activity: Goal: Risk for activity intolerance will decrease Outcome: Not Progressing   Problem: Nutrition: Goal: Adequate nutrition will be maintained Outcome: Progressing   Problem: Coping: Goal: Level of anxiety will decrease Outcome: Progressing   Problem: Elimination: Goal: Will not experience complications related to bowel motility Outcome: Progressing Goal: Will not experience complications related to urinary retention Outcome: Not Applicable

## 2023-07-17 NOTE — TOC Progression Note (Signed)
Transition of Care [facility]) - Initial/Assessment Note    Patient Details  Name: [name] MRN: [id number] Date of Birth: Aug 25, 1946  Transition of Care [facility]) CM/SW Contact:    Ralene Bathe, LCSW Phone Number: 07/17/2023, 3:36 PM  Clinical Narrative:                 LCSW received a call from [facility] social worker, Marylene Land.  Update provided.  Patient has as SNF bed offers, but OP HD clinic approval is still pending.  TOC following.         Patient Goals and CMS Choice            Expected Discharge Plan and Services                                              Prior Living Arrangements/Services                       Activities of Daily Living Home Assistive Devices/Equipment: None ADL Screening (condition at time of admission) Patient's cognitive ability adequate to safely complete daily activities?: Yes Is the patient deaf or have difficulty hearing?: No Does the patient have difficulty seeing, even when wearing glasses/contacts?: No Does the patient have difficulty concentrating, remembering, or making decisions?: Yes Patient able to express need for assistance with ADLs?: Yes Does the patient have difficulty dressing or bathing?: Yes Independently performs ADLs?: No Does the patient have difficulty walking or climbing stairs?: Yes Weakness of Legs: Both Weakness of Arms/Hands: None  Permission Sought/Granted                  Emotional Assessment              Admission diagnosis:  Hyperkalemia [E87.5] Patient Active Problem List   Diagnosis Date Noted   Failure to thrive in adult 07/12/2023   Protein-calorie malnutrition, severe 07/10/2023   Discharge planning issues 07/10/2023   AVM (arteriovenous malformation) of duodenum, acquired with hemorrhage 07/09/2023   AVM (arteriovenous malformation) of stomach, acquired with hemorrhage 07/09/2023   Melena 07/08/2023   ESRD on dialysis (HCC) 07/08/2023   Acute encephalopathy  07/08/2023   Gastrointestinal hemorrhage 07/08/2023   Acute on chronic anemia 07/07/2023   AKI (acute kidney injury) (HCC) 04/23/2020   Cough with hemoptysis 08/02/2019   SOB (shortness of breath)    Atypical chest pain    Chest pain on exertion 05/15/2019   Cerebral embolism with cerebral infarction 05/09/2019   Expressive aphasia 05/08/2019   Other abnormal glucose 11/26/2018   Polypharmacy 06/25/2018   Depression with anxiety    Tracheostomy present (HCC)    Tracheostomy care (HCC)    Calculus of gallbladder without cholecystitis without obstruction 12/24/2017   Chronic cough 09/25/2017   History of colonic polyps    Polyp of transverse colon    Polyp of sigmoid colon    Moderate protein-calorie malnutrition (HCC) 06/02/2017   Chronic idiopathic constipation 03/19/2017   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   Permanent atrial fibrillation (HCC)    Laryngeal stenosis 09/18/2016   Esophageal dysphagia    Polymyalgia rheumatica (HCC) 04/10/2015   Hypertrophic cardiomyopathy (HCC) 02/15/2015   Eczema 09/18/2014   Environmental allergies 09/18/2014   Pre-diabetes 11/26/2013   COPD, moderate (HCC) 11/01/2013   Complete heart block (HCC) 04/03/2011   Acute on chronic diastolic heart failure (HCC) 02/20/2011  Anemia 02/17/2011   AVM (arteriovenous malformation) of small bowel, acquired 02/13/2011   Current use of long term anticoagulation 01/15/2011   PACEMAKER-St.Jude 11/28/2010   CKD (chronic kidney disease) stage 4, GFR 15-29 ml/min (HCC) 09/24/2010   Acute on chronic congestive heart failure (HCC) 08/30/2010   ARTHRITIS 07/24/2010   Generalized anxiety disorder 07/23/2010   Hyperlipidemia 02/11/2007   Essential hypertension 02/11/2007   APNEA, SLEEP 02/11/2007   PCP:  Inc, [facility] Pharmacy:   [facility] - [place], [place] - [address] [place] [address] Phone: [phone] Fax:  [phone]     Social Determinants of Health (SDOH) Social History: SDOH Screenings   Food Insecurity: No Food Insecurity (07/13/2023)  Housing: Low Risk  (07/13/2023)  Transportation Needs: No Transportation Needs (07/13/2023)  Utilities: Not At Risk (07/13/2023)  Depression (PHQ2-9): Low Risk  (05/20/2019)  Tobacco Use: Medium Risk (07/09/2023)   SDOH Interventions: Transportation Interventions: Intervention Not Indicated, Inpatient TOC, Patient Resources (Friends/Family)   Readmission Risk Interventions    07/08/2023    3:26 PM  Readmission Risk Prevention Plan  Transportation Screening Complete  PCP or Specialist Appt within 3-5 Days Complete  HRI or Home Care Consult Complete  Social Work Consult for Recovery Care Planning/Counseling Complete  Palliative Care Screening Not Applicable  Medication Review Oceanographer) Referral to Pharmacy

## 2023-07-17 NOTE — Progress Notes (Signed)
Subjective: [name] is a 77 year-old female who has a PMHx of of complete heart block s/p Abbott PPM in 1986, atrial fibrillation, HTN, aortic atherosclerosis, laryngeal stenosis s/p tracheostomy, CVA 04/2019, GI bleed 2012, AVMs, HCM/chronic congestive heart failure, ESRD s/p fistula, COPD who presented with decreased PO intake and melena found to have acute on chronic anemia and GI bleed   Tolerated 4 hours at chair. Will go for dialysis in chair on 8/3. She is gradually improving her PO intake and reminded the importance of her nutritional status.   Objective: Vital signs in last 24 hours: Vitals:   07/16/23 1839 07/16/23 1840 07/17/23 0604 07/17/23 1030  BP: (!) 99/48 (!) 105/49 (!) 101/47 95/81  Pulse: 70 70 69 69  Resp: 18 18 18    Temp:  97.8 F (36.6 C) 98.4 F (36.9 C) 98.2 F (36.8 C)  TempSrc:      SpO2: 100% 100% 100% 100%  Weight:  48.1 kg    Height:       Weight change:   Intake/Output Summary (Last 24 hours) at 07/17/2023 1314 Last data filed at 07/17/2023 1007 Gross per 24 hour  Intake 120 ml  Output 402 ml  Net -282 ml   Physical Exam: General appearance: frail, mild distress, alert, laying at bed   Eyes: conjunctivae/corneas clear. PERRL, EOM's intact Neck:  tracheostomy valve removed, dressing in place  Lungs: CTAB, normal WOB  Heart: RRR, S1, S2 normal, no m/r/g Abdomen: soft, NTND Extremities: LUE AV graft fistula in place Neurologic: Oriented to place and name, but not to time    Lab Results:    Latest Ref Rng & Units 07/17/2023    9:03 AM 07/16/2023    5:36 AM 07/15/2023    4:16 AM  CBC  WBC 4.0 - 10.5 K/uL 9.3  7.8  7.4   Hemoglobin 12.0 - 15.0 g/dL 7.4  7.3  7.8   Hematocrit 36.0 - 46.0 % 24.4  23.8  26.1   Platelets 150 - 400 K/uL 232  173  179       Latest Ref Rng & Units 07/17/2023    9:03 AM 07/16/2023    2:56 AM 07/15/2023    4:16 AM  CMP  Glucose 70 - 99 mg/dL 440  102  94   BUN 8 - 23 mg/dL 32  36  21   Creatinine 0.44 - 1.00  mg/dL 7.25  3.66  4.40   Sodium 135 - 145 mmol/L 134  129  133   Potassium 3.5 - 5.1 mmol/L 4.2  3.7  3.6   Chloride 98 - 111 mmol/L 94  92  94   CO2 22 - 32 mmol/L 26  25  28    Calcium 8.9 - 10.3 mg/dL 8.1  8.0  8.2    No results for input(s): "GLUCAP" in the last 72 hours.  Micro Results: No results found for this or any previous visit (from the past 240 hour(s)).  Studies/Results: No results found.  Medications: I have reviewed the patient's current medications. Scheduled Meds:  sodium chloride   Intravenous Once   sodium chloride   Intravenous Once   apixaban  2.5 mg Oral BID   Chlorhexidine Gluconate Cloth  6 each Topical Q0600   darbepoetin (ARANESP) injection - DIALYSIS  60 mcg Subcutaneous Q Tue-1800   donepezil  10 mg Oral QHS   feeding supplement  237 mL Oral BID BM   metoprolol succinate  12.5 mg Oral Daily  midodrine  10 mg Oral Q T,Th,Sa-HD   mirtazapine  7.5 mg Oral QHS   pantoprazole  40 mg Oral BID   rosuvastatin  40 mg Oral QHS   sucralfate  1 g Oral TID AC & HS   Continuous Infusions:  albumin human     PRN Meds:.acetaminophen **OR** acetaminophen, albumin human, senna-docusate  Assessment/Plan: Principal Problem:   Acute on chronic anemia Active Problems:   Current use of long term anticoagulation   Tracheostomy present (HCC)   Melena   ESRD on dialysis (HCC)   Acute encephalopathy   Gastrointestinal hemorrhage   AVM (arteriovenous malformation) of duodenum, acquired with hemorrhage   AVM (arteriovenous malformation) of stomach, acquired with hemorrhage   Protein-calorie malnutrition, severe   Discharge planning issues   Failure to thrive in adult  [name] is a 77 year-old female who has a PMHx of of complete heart block s/p Abbott PPM in 1986, atrial fibrillation, HTN, aortic atherosclerosis, laryngeal stenosis s/p tracheostomy, CVA 04/2019, GI bleed 2012, AVMs, HCM/chronic congestive heart failure, ESRD s/p fistula, COPD who  presented with decreased PO intake and melena found to have acute on chronic anemia and GI bleed   Active Problems   #Poor PO intake Gradually improving her PO intake. Reminded the importance of her nutrition. May need to consider tube feed if nutritional status worsens    -Dietician following -Encourage PO intake  -Feeding supplement  -Monitor daily weights  -Consider involving palliative care for GOC if worsening   #Melena  UGIB  H/o small bowel AVMs #Acute on chronic anemia, stable   Enteroscopy on 7/25 showed multiple angiodysplastic lesions treated with APC. Received ESA agent on 7/30 which can take a few weeks to have a full effect. Hb stable. Transfuse if needed   -Cont PO protonix 40mg  BID  -Cont Carafate TID for 2 weeks  -Cont Eliquis 2.5mg  BID  -PT/OT  -If Hb falls below 7, consider 1u pRBC transfusion    #ESRD  AV graft fistula #Hyperkalemia, resolved   #Hypermagnesia, resolved  Hemodialysis schedule T/Th/Sat. Tolerate sitting in chair for 4 hours. Planning for dialysis on 8/3 with chair. Working on being approved for outpatient dialysis.   -Dialysis in chair 8/3  -Received first dose of 2-dose series HBV vaccine. Plan for 2nd dose in 1 month outpatient  -Daily BMP    #Trach collar (removed)  H/o laryngeal stenosis  Decannulated tracheostomy tube on 8/1. She has normal O2 sats    -Monitor vitals  -SLP following   #Dementia  Reported onset of dementia in 2021. -Continue home donepezil 10mg  daily    Chronic Problems   #Atrial fibrillation, controlled #HLD, controlled  #H/o stroke Reported stroke on 04/2019 with residual expressive aphasia. Last lipid panel showed LDL 55 (05/29/23) -Cont home rosuvastatin 40mg   -Cont metoprolol succinate 12.5mg  daily   -Cont Eliquis 2.5mg  BID    #Chronic CHF  HCM  #Complete heart block s/p PPM (1986)  Follows with HF clinic.   -Continue metoprolol succinate 12.5mg  daily  -May consider home diuretic on non-dialysis  days based on volume status      #HTN, controlled -Continue to monitor  This is a Psychologist, occupational Note.  The care of the patient was discussed with Dr. Ned Card and the assessment and plan formulated with their assistance.  Please see their attached note for official documentation of the daily encounter.   LOS: 10 days   Loyal Buba 07/17/2023, 1:14 PM

## 2023-07-17 NOTE — Progress Notes (Signed)
Speech Language Pathology Treatment: Dysphagia  Patient Details Name: [name] MRN: [id number] DOB: [birth year] Today's Date: 07/17/2023 Time: 6237-6283 SLP Time Calculation (min) (ACUTE ONLY): 14 min  Assessment / Plan / Recommendation Clinical Impression  Pt reports no difficulty eating Dys 2 diet, although reports she would like to eat more solid foods. SLP observed pt with her lunch tray consisting of Dys 2 solids and thin liquids with no overt s/s of dysphagia or aspiration. Pt then observed with trials of solids with complete oral clearance and prompt swallow initiation. Recommend diet of Dys 3 textures with thin liquids. Suspect this may make meal trays more appealing and increase intake. Will f/u as able.    HPI HPI: Patient is a 77 y.o. female with PMH: dementia, complete heart block s/p Abbott PPM in 1986, atrial fibrillation, HTN, aortic atherosclerosis, laryngeal stenosis s/p tracheostomy (2017), CVA 04/2019, GI bleed 2012, AVMs, HCM/chronic congestive heart failure, ESRD s/p fistula, COPD who presented to the hospital on 07/07/23 with decreased PO intake and melena found to have acute on chronic anemia and GI bleed. She was started on a full liquids diet on 07/09/23, Her trach was capped on 7/27. On 7/29, RD contacted MD due to patient wanting solid food and so SLP swallow evaluation was ordered.      SLP Plan  Continue with current plan of care      Recommendations for follow up therapy are one component of a multi-disciplinary discharge planning process, led by the attending physician.  Recommendations may be updated based on patient status, additional functional criteria and insurance authorization.    Recommendations  Diet recommendations: Dysphagia 3 (mechanical soft);Thin liquid Liquids provided via: Cup;Straw Medication Administration: Whole meds with liquid (in liquid PRN) Supervision: Staff to assist with self feeding Compensations: Slow rate;Small  sips/bites Postural Changes and/or Swallow Maneuvers: Seated upright 90 degrees;Upright 30-60 min after meal                  Oral care BID   Frequent or constant Supervision/Assistance Dysphagia, unspecified (R13.10)     Continue with current plan of care     Gwynneth Aliment, M.A., CF-SLP Speech Language Pathology, Acute Rehabilitation Services  Secure Chat preferred [phone]   07/17/2023, 1:51 PM

## 2023-07-18 DIAGNOSIS — K921 Melena: Secondary | ICD-10-CM | POA: Diagnosis not present

## 2023-07-18 DIAGNOSIS — D649 Anemia, unspecified: Secondary | ICD-10-CM | POA: Diagnosis not present

## 2023-07-18 DIAGNOSIS — K922 Gastrointestinal hemorrhage, unspecified: Secondary | ICD-10-CM

## 2023-07-18 DIAGNOSIS — N186 End stage renal disease: Secondary | ICD-10-CM | POA: Diagnosis not present

## 2023-07-18 DIAGNOSIS — R195 Other fecal abnormalities: Secondary | ICD-10-CM | POA: Diagnosis not present

## 2023-07-18 DIAGNOSIS — I4891 Unspecified atrial fibrillation: Secondary | ICD-10-CM | POA: Diagnosis not present

## 2023-07-18 LAB — CBC WITH DIFFERENTIAL/PLATELET
Abs Immature Granulocytes: 0.11 10*3/uL — ABNORMAL HIGH (ref 0.00–0.07)
Basophils Absolute: 0.1 10*3/uL (ref 0.0–0.1)
Basophils Relative: 0 %
Eosinophils Absolute: 0.2 10*3/uL (ref 0.0–0.5)
Eosinophils Relative: 1 %
HCT: 27.8 % — ABNORMAL LOW (ref 36.0–46.0)
Hemoglobin: 9.3 g/dL — ABNORMAL LOW (ref 12.0–15.0)
Immature Granulocytes: 1 %
Lymphocytes Relative: 5 %
Lymphs Abs: 0.7 10*3/uL (ref 0.7–4.0)
MCH: 29.4 pg (ref 26.0–34.0)
MCHC: 33.5 g/dL (ref 30.0–36.0)
MCV: 88 fL (ref 80.0–100.0)
Monocytes Absolute: 1.4 10*3/uL — ABNORMAL HIGH (ref 0.1–1.0)
Monocytes Relative: 10 %
Neutro Abs: 11.8 10*3/uL — ABNORMAL HIGH (ref 1.7–7.7)
Neutrophils Relative %: 83 %
Platelets: 178 10*3/uL (ref 150–400)
RBC: 3.16 MIL/uL — ABNORMAL LOW (ref 3.87–5.11)
RDW: 15.8 % — ABNORMAL HIGH (ref 11.5–15.5)
WBC: 14.1 10*3/uL — ABNORMAL HIGH (ref 4.0–10.5)
nRBC: 0.1 % (ref 0.0–0.2)

## 2023-07-18 LAB — PREPARE RBC (CROSSMATCH)

## 2023-07-18 MED ORDER — ALBUMIN HUMAN 25 % IV SOLN
INTRAVENOUS | Status: AC
  Filled 2023-07-18: qty 100

## 2023-07-18 MED ORDER — SODIUM CHLORIDE 0.9% IV SOLUTION: Freq: Once | INTRAVENOUS | Status: AC

## 2023-07-18 NOTE — Progress Notes (Signed)
Received call from nursing per increased melena. Overnight, pt has experienced 9 instances of melenic stools of progressing frequency and volume. Accordingly she has received 2u PRBCs. Large melenic stool is present at time of examination. However, [name] is feeling well overall. Her vitals have been stable throughout the night. Overnight pattern concerning for evolving GI bleed. Plan to transition her to progressive care. Further plan per day team.

## 2023-07-18 NOTE — Progress Notes (Signed)
[facility] Progress Note   Assessment/ Plan:   [name] is a/an 77 y.o. female with a past medical history significant for CKD V, CHF, GI bleeding/AVMs, DM2, afib, dementia, frailty, admitted for GI bleeding and ARF.          AKI on CKD V --> ESRD.  Family wants to pursue dialysis             - Continue HD on TTS schedule- HD /30             -  ESRD CLIP  - decannulated 07/15/23             - continue with AVG, working well             -  midodrine 10mg  with HD             - pal care involved             - needs to sit in chair for HD- next HD Saturday   2.  Hyperkalemia: now resolved   3.  Anemia:             - suspect GI bleed on top of anemia of renal failure             - s/p push enteroscopy             - transfusions overnight 2 u pRBCs  - GI re-consulted today             - Start esa on Tuesday   4.  BMM             - PTH 425, phos at goal             - ca corrects to normal when accounting for albumin   5.  Dementia:             - on Aricept             - pal care involved   6.  Dispo: floor   7. Nutrition: albumin 2. Push protein  Subjective:    For HD today.  Had melanotic stools last night.  S/p 2 u pRBCs   Objective:   BP 116/65 (BP Location: Right Arm)   Pulse 69   Temp 98 F (36.7 C) (Oral)   Resp 19   Ht 5\' 5"  (1.651 m)   Wt 48.1 kg   SpO2 99%   BMI 17.65 kg/m   Physical Exam: MVH:QIONG in bed, getting cleaned up CVS: RRR Resp: clear, trach site bandaged Abd: soft Ext: no LE edema ACCESS: AVG  Labs: BMET Recent Labs  Lab 07/12/23 0040 07/13/23 0256 07/14/23 0013 07/15/23 0416 07/16/23 0256 07/17/23 0903 07/18/23 0643  NA 135 132* 134* 133* 129* 134* 134*  K 3.4* 3.1* 4.5 3.6 3.7 4.2 4.3  CL 96* 95* 100 94* 92* 94* 95*  CO2 27 27 22 28 25 26 24   GLUCOSE 113* 114* 119* 94 112* 109* 121*  BUN 11 29* 44* 21 36* 32* 61*  CREATININE 2.45* 3.93* 4.53* 3.09* 4.39* 3.37* 4.42*  CALCIUM 8.0* 7.8* 7.9* 8.2*  8.0* 8.1* 8.1*  PHOS 1.9* 2.1*  --   --   --   --  2.5   CBC Recent Labs  Lab 07/16/23 0536 07/17/23 0903 07/17/23 1758 07/18/23 0643 07/18/23 1210  WBC 7.8 9.3  --  12.5* 14.1*  NEUTROABS  --   --   --   --  11.8*  HGB 7.3* 7.4* 6.7* 10.3* 9.3*  HCT 23.8* 24.4* 23.1* 32.0* 27.8*  MCV 91.5 91.7  --  89.1 88.0  PLT 173 232  --  196 178      Medications:     sodium chloride   Intravenous Once   sodium chloride   Intravenous Once   Chlorhexidine Gluconate Cloth  6 each Topical Q0600   darbepoetin (ARANESP) injection - DIALYSIS  60 mcg Subcutaneous Q Tue-1800   donepezil  10 mg Oral QHS   feeding supplement  237 mL Oral BID BM   metoprolol succinate  12.5 mg Oral Daily   midodrine  10 mg Oral Q T,Th,Sa-HD   mirtazapine  7.5 mg Oral QHS   pantoprazole  40 mg Oral BID   rosuvastatin  40 mg Oral QHS   sucralfate  1 g Oral TID AC & HS     Bufford Buttner, MD 07/18/2023, 1:06 PM

## 2023-07-18 NOTE — Progress Notes (Signed)
Received patient in bed to unit.  Alert and oriented.  Informed consent signed and in chart.   TX duration:  Patient tolerated well.  Transported back to the room  Alert, without acute distress.  Hand-off given to patient's nurse.   Access used: LAVF Access issues: none  Total UF removed: Medication(s) given: Albumin, see MAR   07/18/23 1731  Vitals  Temp 97.8 F (36.6 C)  Temp Source Oral  BP (!) 115/56  MAP (mmHg) 73  BP Location Right Arm  BP Method Automatic  Patient Position (if appropriate) Lying  Pulse Rate 70  Pulse Rate Source Monitor  ECG Heart Rate 70  Resp 20  Level of Consciousness  Level of Consciousness Alert  MEWS COLOR  MEWS Score Color Green  Oxygen Therapy  SpO2 100 %  O2 Device Nasal Cannula  O2 Flow Rate (L/min) 3 L/min  Patient Activity (if Appropriate) In bed  Pulse Oximetry Type Continuous  Oximetry Probe Site Changed No  Pain Assessment  Pain Scale 0-10  Pain Score 0  PCA/Epidural/Spinal Assessment  Respiratory Pattern Regular  MEWS Score  MEWS Temp 0  MEWS Systolic 0  MEWS Pulse 0  MEWS RR 0  MEWS LOC 0  MEWS Score 0     Stacie Glaze LPN [facility]

## 2023-07-18 NOTE — Progress Notes (Addendum)
HD#11 Subjective:  Overnight Events: She had 9 melenic stools overnight. Patient is not reporting feeling dizzy.  She is status post 2u of pRBC's.   This morning she is feeling well, pt has not had melenic stools.  Objective:  Vital signs in last 24 hours: Vitals:   07/18/23 0349 07/18/23 0604 07/18/23 0813 07/18/23 0821  BP: (!) 110/52 (!) 112/55 116/65   Pulse: 69 70  69  Resp: 16 18 19    Temp: 97.7 F (36.5 C) 97.7 F (36.5 C) 98 F (36.7 C)   TempSrc: Axillary Oral Oral   SpO2: 100% 100%  99%  Weight:      Height:       Supplemental O2: Room Air  Physical Exam:   General: Frail, lying in bed.  Not in acute distress.   Eyes: conjunctivae/corneas clear. PERRL, EOM's intact  Neck:  tracheostomy valve removed, dressing in place  CV: RRR. No m/r/g. No LE edema Pulmonary: Lungs are clear bilaterally.   Abdominal: Soft, nontender, nondistended. Normal bowel sounds. Extremities: LUE AV graft fistula in place . Skin: Warm and dry. No obvious rash or lesions. Neuro: A&Ox3. Moves all extremities. Normal sensation. No focal deficit.   Filed Weights   07/14/23 1142 07/16/23 1455 07/16/23 1840  Weight: 49.5 kg 48.6 kg 48.1 kg     Intake/Output Summary (Last 24 hours) at 07/18/2023 0935 Last data filed at 07/18/2023 1610 Gross per 24 hour  Intake 841.67 ml  Output 0 ml  Net 841.67 ml   Net IO Since Admission: 3,836.48 mL [07/18/23 0935]  Recent Labs    07/17/23 2133  GLUCAP 191*     Pertinent Labs:    Latest Ref Rng & Units 07/18/2023    6:43 AM 07/17/2023    5:58 PM 07/17/2023    9:03 AM  CBC  WBC 4.0 - 10.5 K/uL 12.5   9.3   Hemoglobin 12.0 - 15.0 g/dL 96.0  6.7  7.4   Hematocrit 36.0 - 46.0 % 32.0  23.1  24.4   Platelets 150 - 400 K/uL 196   232        Latest Ref Rng & Units 07/18/2023    6:43 AM 07/17/2023    9:03 AM 07/16/2023    2:56 AM  CMP  Glucose 70 - 99 mg/dL 454  098  119   BUN 8 - 23 mg/dL 61  32  36   Creatinine 0.44 - 1.00 mg/dL 1.47  8.29  5.62    Sodium 135 - 145 mmol/L 134  134  129   Potassium 3.5 - 5.1 mmol/L 4.3  4.2  3.7   Chloride 98 - 111 mmol/L 95  94  92   CO2 22 - 32 mmol/L 24  26  25    Calcium 8.9 - 10.3 mg/dL 8.1  8.1  8.0     Imaging: No results found.  Assessment/Plan:   Principal Problem:   Acute on chronic anemia Active Problems:   Current use of long term anticoagulation   Tracheostomy present (HCC)   Melena   ESRD on dialysis (HCC)   Acute encephalopathy   Gastrointestinal hemorrhage   AVM (arteriovenous malformation) of duodenum, acquired with hemorrhage   AVM (arteriovenous malformation) of stomach, acquired with hemorrhage   Protein-calorie malnutrition, severe   Discharge planning issues   Failure to thrive in adult   Patient Summary: [name] is a 77 year-old female who has a PMHx of of complete heart block s/p  Abbott PPM in 1986, atrial fibrillation, HTN, aortic atherosclerosis, laryngeal stenosis s/p tracheostomy, CVA 04/2019, GI bleed 2012, AVMs, HCM/chronic congestive heart failure, ESRD s/p fistula, COPD who presented with decreased PO intake and melena found to have acute on chronic anemia and GI bleed    #Melena  UGIB  H/o small bowel AVMs #Acute on chronic anemia, stable   Overnight, she had 9 melenic stools of increasing frequency and volume.  She is S/p 2u pRBC's transfusion, and hemoglobin stable at 10. The most likely etiology will be bleeding from AVMs. On 7/25 showed multiple angiodysplastic lesions treated with APC. Received ESA agent on 7/30 which can take a few weeks to have a full effect.  -GI consulted, follow-up with GI recommendation -Cont PO protonix 40mg  BID  -Cont Carafate TID for 2 weeks  -Holding Eliquis 2.5mg  BID  -PT/OT  -If Hb falls below 7, consider 1u pRBC transfusion   #Poor PO intake Gradually improving her PO intake. Reminded the importance of her nutrition. May need to consider tube feed if nutritional status worsens  -Dietician  following -Encourage PO intake  -Feeding supplement  -Monitor daily weights  -Consider involving palliative care for GOC if worsenin  3. #ESRD  AV graft fistula     #Hyperkalemia, resolved       #Hypermagnesia, resolved  Hemodialysis schedule T/Th/Sat. Tolerate sitting in chair for 4 hours. Planning for dialysis on 8/3 with chair. Working on being approved for outpatient dialysis.   - Dialysis in chair 8/3  - Received first dose of 2-dose series HBV vaccine. Plan for 2nd dose in 1 month outpatient  - Daily BMP   #Trach collar (removed)  H/o laryngeal stenosis  Decannulated tracheostomy tube on 8/1. She has normal O2 sats    -Monitor vitals  -SLP following    #Dementia  Reported onset of dementia in 2021. -Continue home donepezil 10mg  daily     Chronic Problems   #Atrial fibrillation, controlled #HLD, controlled  #H/o stroke Reported stroke on 04/2019 with residual expressive aphasia. Last lipid panel showed LDL 55 (05/29/23) -Cont home rosuvastatin 40mg   -Cont metoprolol succinate 12.5mg  daily   -Eliquis on hold.    #Chronic CHF  HCM  #Complete heart block s/p PPM (1986)  Follows with HF clinic.   -Continue metoprolol succinate 12.5mg  daily  -May consider home diuretic on non-dialysis days based on volume status      #HTN, controlled -Continue to monitor    Diet: Renal diet IVF: PO intake VTE: Place TED hose Start: 07/17/23 1538 SCDs Start: 07/07/23 1828 Code:  Full code PT/OT: SNF for Subacute PT ID:  Anti-infectives (From admission, onward)    None        Anticipated discharge to Skilled nursing facility  3 days  pending management.[name]  Laretta Bolster, MD 07/18/2023, 9:35 AM Pager: [phone] [facility]  Please contact the on call pager after 5 pm and on weekends at [phone].

## 2023-07-18 NOTE — Progress Notes (Addendum)
[facility] Progress Note  CC:  Anemia and black stools  Subjective: GI has been called back again due to ongoing dark stools.  Yesterday hemoglobin was down to 6.7 g.  She was transfused with two units of packed red blood cells and hemoglobin up to 10.3 g earlier this morning and then 9.3 g on repeat just a little while ago.  Nurse tells me she had 11 dark stools since yesterday, one very large one.  PREVIOUS GI WORKUP-------------------------------------------------  Small bowel endoscopy 07/09/2023: Few bleeding angiodysplastic lesions in stomach treated with APC.  2 nonbleeding angiodysplastic lesions in duodenum treated with APC.  Tattoo seen in jejunum.  No specimens collected.  Small bowel endoscopy 09/2021 for IDA and small bowel AVMs - Z- line regular.  - Normal esophagus otherwise  - A single non- bleeding angiodysplastic lesion in the stomach. Treated with argon plasma coagulation ( APC) .  - A single non- bleeding angiodysplastic lesion in the stomach. Treated with argon plasma coagulation ( APC) .  - Normal stomach otherwise  - Four non- bleeding angiodysplastic lesions in the duodenum. Treated with argon plasma coagulation ( APC) .  - A single non- bleeding angiodysplastic lesion in the jejunum. Treated with argon plasma coagulation ( APC) .  - An area in the distal most aspected of the jejunum was tattooed ( see above, after initial tattoo placed,   EGD 09/04/2020: - Z-line regular, 35 cm from the incisors. - Erosive gastropathy with no bleeding and no stigmata of recent bleeding. - Two angiodysplastic lesions typical arborization in the duodenum. Treated with argon plasma coagulation (APC). - The examination was otherwise normal. - No specimens collected.   Colonoscopy 09/04/2020: - Five 3 to 5 mm polyps in the transverse colon, removed with a cold snare. Resected and retrieved. - Three 1 to 2 mm polyps in the sigmoid colon and in the descending colon,  removed with a cold biopsy forceps. Resected and retrieved. - Melanosis in the colon. - Non-bleeding internal hemorrhoids. - The examination was otherwise normal. FINAL MICROSCOPIC DIAGNOSIS:   A. COLON, TRANSVERSE, POLYPECTOMY:  - Tubular adenoma (s).  - No high-grade dysplasia or carcinoma.   B. COLON, DESCENDING, SIGMOID, POLYPECTOMY:  - Tubular adenoma (s).  - No high-grade dysplasia or carcinoma.   EGD and colonoscopy July 2018.  EGD was unremarkable and colonoscopy with finding of 7 polyps, the largest was 15 mm all removed.  Path consistent with tubular adenomas and follow-up in 3 years was recommended.   Small bowel capsule endoscopy in 2013: Negative but with fair to poor prep  Objective:  Vital signs in last 24 hours: Temp:  [97.5 F (36.4 C)-98 F (36.7 C)] 98 F (36.7 C) (08/03 0813) Pulse Rate:  [69-70] 69 (08/03 0821) Resp:  [14-19] 19 (08/03 0813) BP: (93-116)/(49-76) 116/65 (08/03 0813) SpO2:  [89 %-100 %] 99 % (08/03 0821) Last BM Date : 07/18/23 General:  Alert, chronically ill-appearing, in NAD Heart:  Regular rate and rhythm; no murmurs Pulm:  CTAB.  No W/R/R.  Trach on neck covered Abdomen:  Soft, non-distended.  BS present.  Non-tender. Extremities:  Feet/legs in pressure boots.  Intake/Output from previous day: 08/02 0701 - 08/03 0700 In: 841.7 [P.O.:120; I.V.:33.7; Blood:688] Out: 0  Intake/Output this shift: Total I/O In: 120 [P.O.:120] Out: -   Lab Results: Recent Labs    07/17/23 0903 07/17/23 1758 07/18/23 0643 07/18/23 1210  WBC 9.3  --  12.5* 14.1*  HGB 7.4* 6.7*  10.3* 9.3*  HCT 24.4* 23.1* 32.0* 27.8*  PLT 232  --  196 178   BMET Recent Labs    07/16/23 0256 07/17/23 0903 07/18/23 0643  NA 129* 134* 134*  K 3.7 4.2 4.3  CL 92* 94* 95*  CO2 25 26 24   GLUCOSE 112* 109* 121*  BUN 36* 32* 61*  CREATININE 4.39* 3.37* 4.42*  CALCIUM 8.0* 8.1* 8.1*   LFT Recent Labs    07/18/23 0643  ALBUMIN 2.0*   Assessment /  Plan: 77 year old female history of laryngeal stenosis s/p tracheostomy and ESRD s/p fistula just started dialysis this admission with history of chronic GI blood loss second to AVMs  Small bowel endoscopy 07/09/2023: Few bleeding angiodysplastic lesions in stomach treated with APC.  2 nonbleeding angiodysplastic lesions in duodenum treated with APC.  Tattoo seen in jejunum.  No specimens collected.  Hemoglobin has been trending down again and was at 6.7 g yesterday.  She received 2 units of packed red blood cells yesterday/overnight and early this morning hemoglobin is 10.3 g, but on recheck was a little while ago it was 9.3 g.  Suspect this should only go up about a gram with 1 unit of packed red blood cells.  Anyway, nursing reports several black stools, one very large one.   A-fib on Eliquis:  This was held initially but resumed on 7/27 and has been continued, received dose last night, not so far today.   ESRD s/p fistula:  In dialysis now.  -She is on pantoprazole 40 mg twice daily and that should be continued.  She is also on Carafate suspension 4 times daily. -Will discuss with Dr. Marina Goodell regarding any further repeat procedures. -Trend Hgb and transfuse further prn. -Hold Eliquis, may need repeat EGD/enteroscopy once washed out, ? Monday.    LOS: 11 days   Princella Pellegrini. Zehr  07/18/2023, 1:47 PM  GI ATTENDING  Reconsultation for drop in hemoglobin and dark stools.  Very recent endoscopy with therapeutics as described.  Back on anticoagulation post procedure. I suspect that her bleeding is secondary to ulcers from [facility] agree with holding anticoagulation for now.  Agree with transfusions to desired hemoglobin.  Continue PPI and Carafate.  At this point I would just observe closely.  No plans for relook endoscopy unless significant bleeding persists.  Will follow.  Wilhemina Bonito. Eda Keys., M.D. [facility] Division of Gastroenterology

## 2023-07-19 DIAGNOSIS — N186 End stage renal disease: Secondary | ICD-10-CM | POA: Diagnosis not present

## 2023-07-19 DIAGNOSIS — I4891 Unspecified atrial fibrillation: Secondary | ICD-10-CM | POA: Diagnosis not present

## 2023-07-19 DIAGNOSIS — D649 Anemia, unspecified: Secondary | ICD-10-CM | POA: Diagnosis not present

## 2023-07-19 DIAGNOSIS — R195 Other fecal abnormalities: Secondary | ICD-10-CM | POA: Diagnosis not present

## 2023-07-19 DIAGNOSIS — K921 Melena: Secondary | ICD-10-CM | POA: Diagnosis not present

## 2023-07-19 DIAGNOSIS — K922 Gastrointestinal hemorrhage, unspecified: Secondary | ICD-10-CM | POA: Diagnosis not present

## 2023-07-19 LAB — CBC WITH DIFFERENTIAL/PLATELET
Abs Immature Granulocytes: 0.04 10*3/uL (ref 0.00–0.07)
Basophils Absolute: 0 10*3/uL (ref 0.0–0.1)
Basophils Relative: 0 %
Eosinophils Absolute: 0 10*3/uL (ref 0.0–0.5)
Eosinophils Relative: 0 %
HCT: 26.6 % — ABNORMAL LOW (ref 36.0–46.0)
Hemoglobin: 8.3 g/dL — ABNORMAL LOW (ref 12.0–15.0)
Immature Granulocytes: 0 %
Lymphocytes Relative: 8 %
Lymphs Abs: 0.8 10*3/uL (ref 0.7–4.0)
MCH: 27.7 pg (ref 26.0–34.0)
MCHC: 31.2 g/dL (ref 30.0–36.0)
MCV: 88.7 fL (ref 80.0–100.0)
Monocytes Absolute: 0.9 10*3/uL (ref 0.1–1.0)
Monocytes Relative: 9 %
Neutro Abs: 8.1 10*3/uL — ABNORMAL HIGH (ref 1.7–7.7)
Neutrophils Relative %: 83 %
Platelets: 181 10*3/uL (ref 150–400)
RBC: 3 MIL/uL — ABNORMAL LOW (ref 3.87–5.11)
RDW: 16 % — ABNORMAL HIGH (ref 11.5–15.5)
WBC: 9.8 10*3/uL (ref 4.0–10.5)
nRBC: 0 % (ref 0.0–0.2)

## 2023-07-19 LAB — BASIC METABOLIC PANEL WITH GFR
Anion gap: 14 (ref 5–15)
BUN: 27 mg/dL — ABNORMAL HIGH (ref 8–23)
CO2: 25 mmol/L (ref 22–32)
Calcium: 8.1 mg/dL — ABNORMAL LOW (ref 8.9–10.3)
Chloride: 96 mmol/L — ABNORMAL LOW (ref 98–111)
Creatinine, Ser: 2.62 mg/dL — ABNORMAL HIGH (ref 0.44–1.00)
GFR, Estimated: 18 mL/min — ABNORMAL LOW (ref >60)
Glucose, Bld: 89 mg/dL (ref 70–99)
Potassium: 3.7 mmol/L (ref 3.5–5.1)
Sodium: 135 mmol/L (ref 135–145)

## 2023-07-19 LAB — HEMOGLOBIN AND HEMATOCRIT, BLOOD
HCT: 25.5 % — ABNORMAL LOW (ref 36.0–46.0)
Hemoglobin: 8 g/dL — ABNORMAL LOW (ref 12.0–15.0)

## 2023-07-19 LAB — MAGNESIUM: Magnesium: 1.7 mg/dL (ref 1.7–2.4)

## 2023-07-19 MED ORDER — MAGNESIUM SULFATE 2 GM/50ML IV SOLN
2.0000 g | Freq: Once | INTRAVENOUS | Status: AC
  Administered 2023-07-19: 2 g via INTRAVENOUS
  Filled 2023-07-19: qty 50

## 2023-07-19 MED ORDER — LIDOCAINE 5 % EX PTCH
1.0000 | MEDICATED_PATCH | CUTANEOUS | Status: DC
  Administered 2023-07-19 – 2023-07-26 (×8): 1 via TRANSDERMAL
  Filled 2023-07-19 (×8): qty 1

## 2023-07-19 NOTE — Progress Notes (Addendum)
[facility] Progress Note  CC:  Anemia and black stools   Subjective:  She says that she is feeling a little better overall today.  No more significant black stools, nursing says just a smear today and none overnight.  Objective:  Vital signs in last 24 hours: Temp:  [97.6 F (36.4 C)-98.4 F (36.9 C)] 98 F (36.7 C) (08/04 0842) Pulse Rate:  [69-73] 70 (08/04 0842) Resp:  [16-21] 16 (08/04 0842) BP: (92-119)/(51-67) 114/57 (08/04 0842) SpO2:  [95 %-100 %] 100 % (08/04 0842) Last BM Date : 07/18/23 General:  Alert, in NAD Heart:  Regular rate and rhythm; no murmurs Pulm:  CTAB.  No W/R/R.  Trach present, but covered. Abdomen:  Soft, non-distended.  BS present.  Mild diffuse TTP.  Intake/Output from previous day: 08/03 0701 - 08/04 0700 In: 120 [P.O.:120] Out: 500  Intake/Output this shift: Total I/O In: 120 [P.O.:120] Out: 0   Lab Results: Recent Labs    07/18/23 0643 07/18/23 1210 07/19/23 0206  WBC 12.5* 14.1* 9.8  HGB 10.3* 9.3* 8.3*  HCT 32.0* 27.8* 26.6*  PLT 196 178 181   BMET Recent Labs    07/17/23 0903 07/18/23 0643 07/19/23 0206  NA 134* 134* 135  K 4.2 4.3 3.7  CL 94* 95* 96*  CO2 26 24 25   GLUCOSE 109* 121* 89  BUN 32* 61* 27*  CREATININE 3.37* 4.42* 2.62*  CALCIUM 8.1* 8.1* 8.1*   LFT Recent Labs    07/18/23 0643  ALBUMIN 2.0*   Assessment / Plan: 77 year old female history of laryngeal stenosis s/p tracheostomy and ESRD s/p fistula just started dialysis this admission with history of chronic GI blood loss second to AVMs  Small bowel endoscopy 07/09/2023: Few bleeding angiodysplastic lesions in stomach treated with APC.  2 nonbleeding angiodysplastic lesions in duodenum treated with APC.  Tattoo seen in jejunum.  No specimens collected.   Hemoglobin has been trending down again and was at 6.7 g on 8/2.  She received 2 units of packed red blood cells yesterday/overnight and early this morning hemoglobin is 10.3 g, but  on recheck was a little while ago it was 9.3 g.  Nursing reported several black stools, one very large one 8/2-8/3 but   Suspect that her bleeding is secondary to ulcers from APC.   Today Hgb is 8.3 grams.   A-fib on Eliquis:  This was held initially but resumed on 7/27 and had been continued until 8/3 AM, back on hold again since then.   ESRD s/p fistula:  On dialysis.   -She is on pantoprazole 40 mg twice daily and that should be continued.  She is also on Carafate suspension 4 times daily. -Trend Hgb and transfuse further prn. -Hold Eliquis.   LOS: 12 days   Princella Pellegrini. Zehr  07/19/2023, 1:15 PM  GI ATTENDING  Interval history data reviewed.  Patient seen and examined.  Agree with interval progress note.  No further bleeding.  Excellent response to transfusion.  Anticoagulation being held.  I highly suspect the patient had transient GI bleeding secondary to areas treated on very recent enteroscopy with APC to both the stomach and duodenum.  This treatment causes ulceration of the treated areas, by intent.  I would recommend holding Eliquis for another 48 hours.  If no further evidence of bleeding, then resume.  Please contact us for any questions or problems (such as concerns over significant recurrent bleeding).  Thanks,  Wilhemina Bonito. Eda Keys., M.D.  [facility] Division of Gastroenterology

## 2023-07-19 NOTE — Progress Notes (Addendum)
Subjective: [name] is a 77 year-old female who has a PMHx of of complete heart block s/p Abbott PPM in 1986, atrial fibrillation, HTN, aortic atherosclerosis, laryngeal stenosis s/p tracheostomy, CVA 04/2019, GI bleed 2012, AVMs, HCM/chronic congestive heart failure, ESRD s/p fistula, COPD who presented with decreased PO intake and melena found to have acute on chronic anemia and GI bleed   Recently received 2u pRBCs. Reports intermittent SOB. Reminded to increase her oral intake. Denies any new dark stools.   Objective: Vital signs in last 24 hours: Vitals:   07/18/23 1854 07/18/23 2122 07/19/23 0536 07/19/23 0842  BP: 102/64 (!) 109/59 (!) 101/51 (!) 114/57  Pulse: 69 70 70 70  Resp: 16 18 20 16   Temp: 98.4 F (36.9 C) 97.6 F (36.4 C) 98.2 F (36.8 C) 98 F (36.7 C)  TempSrc: Oral   Oral  SpO2: 100% 100% 100% 100%  Weight:      Height:       Weight change:   Intake/Output Summary (Last 24 hours) at 07/19/2023 1610 Last data filed at 07/19/2023 0532 Gross per 24 hour  Intake --  Output 500 ml  Net -500 ml   Physical Exam: General appearance: frail, mild distress, alert, laying at bed   Eyes: conjunctivae/corneas clear. PERRL, EOM's intact Neck:  tracheostomy valve removed, dressing in place  Lungs: decreased breath sounds, normal WOB  Heart: RRR, S1/S2 normal, systolic murmur at RUSB  Abdomen: soft, NTND Extremities: LUE AV graft fistula in place Neurologic: alert and oriented x3, sensation intact   Lab Results:    Latest Ref Rng & Units 07/19/2023    2:06 AM 07/18/2023   12:10 PM 07/18/2023    6:43 AM  CBC  WBC 4.0 - 10.5 K/uL 9.8  14.1  12.5   Hemoglobin 12.0 - 15.0 g/dL 8.3  9.3  96.0   Hematocrit 36.0 - 46.0 % 26.6  27.8  32.0   Platelets 150 - 400 K/uL 181  178  196       Latest Ref Rng & Units 07/19/2023    2:06 AM 07/18/2023    6:43 AM 07/17/2023    9:03 AM  CMP  Glucose 70 - 99 mg/dL 89  454  098   BUN 8 - 23 mg/dL 27  61  32   Creatinine 0.44 -  1.00 mg/dL 1.19  1.47  8.29   Sodium 135 - 145 mmol/L 135  134  134   Potassium 3.5 - 5.1 mmol/L 3.7  4.3  4.2   Chloride 98 - 111 mmol/L 96  95  94   CO2 22 - 32 mmol/L 25  24  26    Calcium 8.9 - 10.3 mg/dL 8.1  8.1  8.1    Recent Labs    07/17/23 2133  GLUCAP 191*    Micro Results: No results found for this or any previous visit (from the past 240 hour(s)).  Studies/Results: No results found.  Medications: I have reviewed the patient's current medications. Scheduled Meds:  sodium chloride   Intravenous Once   sodium chloride   Intravenous Once   Chlorhexidine Gluconate Cloth  6 each Topical Q0600   darbepoetin (ARANESP) injection - DIALYSIS  60 mcg Subcutaneous Q Tue-1800   donepezil  10 mg Oral QHS   feeding supplement  237 mL Oral BID BM   metoprolol succinate  12.5 mg Oral Daily   midodrine  10 mg Oral Q T,Th,Sa-HD   mirtazapine  7.5 mg Oral QHS  pantoprazole  40 mg Oral BID   rosuvastatin  40 mg Oral QHS   sucralfate  1 g Oral TID AC & HS   Continuous Infusions:  albumin human     magnesium sulfate bolus IVPB 2 g (07/19/23 0903)   PRN Meds:.acetaminophen **OR** acetaminophen, albumin human, senna-docusate  Assessment/Plan: Principal Problem:   Acute on chronic anemia Active Problems:   Current use of long term anticoagulation   Tracheostomy present (HCC)   Melena   ESRD on dialysis (HCC)   Acute encephalopathy   Gastrointestinal hemorrhage   AVM (arteriovenous malformation) of duodenum, acquired with hemorrhage   AVM (arteriovenous malformation) of stomach, acquired with hemorrhage   Protein-calorie malnutrition, severe   Discharge planning issues   Failure to thrive in adult  [name] is a 77 year-old female who has a PMHx of of complete heart block s/p Abbott PPM in 1986, atrial fibrillation, HTN, aortic atherosclerosis, laryngeal stenosis s/p tracheostomy, CVA 04/2019, GI bleed 2012, AVMs, HCM/chronic congestive heart failure, ESRD s/p  fistula, COPD who presented with decreased PO intake and melena found to have acute on chronic anemia and GI bleed   Active Problems   #Melena  UGIB  H/o small bowel AVMs #Acute on chronic anemia, stable   Enteroscopy on 7/25 showed multiple angiodysplastic lesions treated with APC. Received ESA agent on 7/30 which can take a few weeks to have a full effect. S/p 2u pRBC os on 8/3 after having multiple episodes of melenic stool. Hb following transfusion 9.3 --> 8.3   -GI following, appreciate recs, may consider repeat EGD -Cont PO protonix 40mg  BID  -Cont Carafate TID for 2 weeks  -Hold Eliquis 2.5mg  BID  -PT/OT  -If Hb falls below 7, consider 1u pRBC transfusion   #Poor PO intake Slowly reintroducing solid foods into her diet. Continue to monitor. Has had 4 lbs weight loss since admission.    -Dietician following -Encourage PO intake  -Feeding supplement  -Monitor daily weights  -Consider involving palliative care for GOC if worsening    #ESRD  AV graft fistula #Hypomagnesia  Hemodialysis schedule T/Th/Sat. Tolerated dialysis while in chair on 8/3. Pending outpatient dialysis sites   -IV 2g mag sulfate  -Received first dose of 2-dose series HBV vaccine. Plan for 2nd dose in 1 month outpatient  -Daily BMP    #Trach collar (removed)  H/o laryngeal stenosis  Decannulated tracheostomy tube on 8/1. She has normal O2 sats    -Monitor vitals  -SLP following   #Dementia  Reported onset of dementia in 2021. -Continue home donepezil 10mg  daily    Chronic Problems   #Atrial fibrillation, controlled #HLD, controlled  #H/o stroke Reported stroke on 04/2019 with residual expressive aphasia. Last lipid panel showed LDL 55 (05/29/23) -Cont home rosuvastatin 40mg   -Cont Eliquis 2.5mg  BID    #Chronic CHF  HCM  #Complete heart block s/p PPM (1986)  Follows with HF clinic.   -Continue metoprolol succinate 12.5mg  daily  -May consider home diuretic on non-dialysis days based on  volume status      #HTN, controlled -Continue to monitor  This is a Psychologist, occupational Note.  The care of the patient was discussed with Dr. Thomasene Ripple  and the assessment and plan formulated with their assistance.  Please see their attached note for official documentation of the daily encounter.   LOS: 12 days   Loyal Buba 07/19/2023, 9:24 AM   Attestation for Student Documentation:  I personally was present and re-performed the  history, physical exam and medical decision-making activities of this service and have verified that the service and findings are accurately documented in the student's note.  Olegario Messier, MD 07/19/2023, 10:50 AM

## 2023-07-19 NOTE — Progress Notes (Signed)
[facility] Progress Note   Assessment/ Plan:   [name] is a/an 77 y.o. female with a past medical history significant for CKD V, CHF, GI bleeding/AVMs, DM2, afib, dementia, frailty, admitted for GI bleeding and ARF.          AKI on CKD V --> ESRD.  Family wants to pursue dialysis             - Continue HD on TTS schedule- HD /30             -  ESRD CLIP  - decannulated 07/15/23             - continue with AVG, working well             -  midodrine 10mg  with HD             - pal care involved             - needs to sit in chair for HD- according to documentation looks like was in bed for Saturday  - next HD Tuesday   2.  Hyperkalemia: now resolved   3.  Anemia:             - suspect GI bleed on top of anemia of renal failure             - s/p push enteroscopy             - transfusions 8/2 2 u pRBCs  - GI re-consulted 8/2             - Start esa on Tuesday   4.  BMM             - PTH 425, phos at goal             - ca corrects to normal when accounting for albumin   5.  Dementia:             - on Aricept             - pal care involved   6.  Dispo: floor   7. Nutrition: albumin 2. Push protein  Subjective:    For HD today.  Had melanotic stools last night.  S/p 2 u pRBCs   Objective:   BP (!) 114/57 (BP Location: Right Arm)   Pulse 70   Temp 98 F (36.7 C) (Oral)   Resp 16   Ht 5\' 5"  (1.651 m)   Wt 48.1 kg   SpO2 100%   BMI 17.65 kg/m   Physical Exam: JXB:JYNWG in bed, getting cleaned up CVS: RRR Resp: clear, trach site bandaged Abd: soft Ext: no LE edema ACCESS: AVG  Labs: BMET Recent Labs  Lab 07/13/23 0256 07/14/23 0013 07/15/23 0416 07/16/23 0256 07/17/23 0903 07/18/23 0643 07/19/23 0206  NA 132* 134* 133* 129* 134* 134* 135  K 3.1* 4.5 3.6 3.7 4.2 4.3 3.7  CL 95* 100 94* 92* 94* 95* 96*  CO2 27 22 28 25 26 24 25   GLUCOSE 114* 119* 94 112* 109* 121* 89  BUN 29* 44* 21 36* 32* 61* 27*  CREATININE 3.93* 4.53*  3.09* 4.39* 3.37* 4.42* 2.62*  CALCIUM 7.8* 7.9* 8.2* 8.0* 8.1* 8.1* 8.1*  PHOS 2.1*  --   --   --   --  2.5  --    CBC Recent Labs  Lab 07/17/23 0903 07/17/23 1758 07/18/23 0643 07/18/23 1210 07/19/23 0206  WBC 9.3  --  12.5* 14.1* 9.8  NEUTROABS  --   --   --  11.8* 8.1*  HGB 7.4* 6.7* 10.3* 9.3* 8.3*  HCT 24.4* 23.1* 32.0* 27.8* 26.6*  MCV 91.7  --  89.1 88.0 88.7  PLT 232  --  196 178 181      Medications:     sodium chloride   Intravenous Once   sodium chloride   Intravenous Once   Chlorhexidine Gluconate Cloth  6 each Topical Q0600   darbepoetin (ARANESP) injection - DIALYSIS  60 mcg Subcutaneous Q Tue-1800   donepezil  10 mg Oral QHS   feeding supplement  237 mL Oral BID BM   metoprolol succinate  12.5 mg Oral Daily   midodrine  10 mg Oral Q T,Th,Sa-HD   mirtazapine  7.5 mg Oral QHS   pantoprazole  40 mg Oral BID   rosuvastatin  40 mg Oral QHS   sucralfate  1 g Oral TID AC & HS     Bufford Buttner, MD 07/19/2023, 9:04 AM

## 2023-07-20 ENCOUNTER — Encounter: Payer: Self-pay | Admitting: Internal Medicine

## 2023-07-20 DIAGNOSIS — D649 Anemia, unspecified: Secondary | ICD-10-CM | POA: Diagnosis not present

## 2023-07-20 DIAGNOSIS — E43 Unspecified severe protein-calorie malnutrition: Secondary | ICD-10-CM | POA: Diagnosis not present

## 2023-07-20 DIAGNOSIS — N186 End stage renal disease: Secondary | ICD-10-CM | POA: Diagnosis not present

## 2023-07-20 DIAGNOSIS — Z992 Dependence on renal dialysis: Secondary | ICD-10-CM | POA: Diagnosis not present

## 2023-07-20 DIAGNOSIS — E871 Hypo-osmolality and hyponatremia: Secondary | ICD-10-CM | POA: Insufficient documentation

## 2023-07-20 MED ORDER — CHLORHEXIDINE GLUCONATE CLOTH 2 % EX PADS
6.0000 | MEDICATED_PAD | Freq: Every day | CUTANEOUS | Status: DC
  Administered 2023-07-21: 6 via TOPICAL

## 2023-07-20 NOTE — Progress Notes (Signed)
  Progress Note   Date: 07/20/2023  Patient Name: [name]        MRN#: [id number]  Review the patient's clinical findings supports the diagnosis of:   Hyponatremia

## 2023-07-20 NOTE — Progress Notes (Signed)
Nutrition Follow-up  DOCUMENTATION CODES:   Underweight, Severe malnutrition in context of chronic illness  INTERVENTION:  ***   NUTRITION DIAGNOSIS:   Severe Malnutrition related to chronic illness as evidenced by severe fat depletion, severe muscle depletion.  ***  GOAL:   Patient will meet greater than or equal to 90% of their needs  ***  MONITOR:   PO intake, Supplement acceptance, Labs, Weight trends, Skin, I & O's  REASON FOR ASSESSMENT:   Malnutrition Screening Tool    ASSESSMENT:   77 y.o. female with PMHx including a-fib, ESRD, COPD, stroke, CHF, laryngeal stenosis s/p trach, and recurrent GI bleeds 2/2 intestinal AVM's was admitted with anemia and melenic stool  Diet upgraded to Dysphagia 3  Needs assistance with feeding- daughter helps when at bedside   Labs: CBG 191, Na 131, BUN 48, Cr 4.18, Mag 2.6 Meds: NS, remeron, protonix, crestor, carafate, magnesium sulfate, Ensure BID  Wt: admit- 110#, current- 106#  PO: 0-25% x last 8 documented meals   I/O's:    NUTRITION - FOCUSED PHYSICAL EXAM:  {RD Focused Exam List:21252}  Diet Order:   Diet Order             DIET DYS 3 Room service appropriate? Yes with Assist; Fluid consistency: Thin  Diet effective now                   EDUCATION NEEDS:   No education needs have been identified at this time  Skin:  Skin Assessment: Reviewed RN Assessment  Last BM:  7/28 type 6  Height:   Ht Readings from Last 1 Encounters:  07/14/23 5\' 5"  (1.651 m)    Weight:   Wt Readings from Last 1 Encounters:  07/16/23 48.1 kg    Ideal Body Weight:     BMI:  Body mass index is 17.65 kg/m.  Estimated Nutritional Needs:   Kcal:  1470-1715  Protein:  60-75 g  Fluid:  > 1.7L    ***

## 2023-07-20 NOTE — Progress Notes (Signed)
Occupational Therapy Treatment Patient Details Name: [name] MRN: [id number] DOB: [birth year] Today's Date: 07/20/2023   History of present illness 77 y.o. female presents to [facility] hospital on 07/07/2023 with symptomatic anemia and FTT. Small bowel enteroscopy on 7/25. Tracheostomy decannulated 8/1. PMH includes afib, ESRD, COPD, CVA, CHF, laryngeal stenosis s/p tracheostomy 2017, chronic GIB, dementia.   OT comments  Patient received seated in recliner and asking to transfer to couch, then to bed and then to [facility]. Patient required max assist for face to face stand pivot transfer to [facility] with patient having difficulty with standing. Patient required increased time to use bathroom and was total assist for toilet hygiene with patient requiring 2 stands with max assist to complete due to fatigue. Patient repositioned in recliner with geomat and increased comfort. Acute OT to continue to follow.    Recommendations for follow up therapy are one component of a multi-disciplinary discharge planning process, led by the attending physician.  Recommendations may be updated based on patient status, additional functional criteria and insurance authorization.    Assistance Recommended at Discharge Frequent or constant Supervision/Assistance  Patient can return home with the following  Two people to help with bathing/dressing/bathroom;Two people to help with walking and/or transfers;Assistance with cooking/housework;Help with stairs or ramp for entrance;Assist for transportation;Direct supervision/assist for financial management;Direct supervision/assist for medications management   Equipment Recommendations  Other (comment) (TBD, defer to next venue)    Recommendations for Other Services      Precautions / Restrictions Precautions Precautions: Fall Precaution Comments: Trach decannulated 8/1, watch BP Restrictions Weight Bearing Restrictions: No       Mobility Bed Mobility Overal bed mobility:  Needs Assistance             General bed mobility comments: recliner upon entry and returned to recliner at end of session    Transfers Overall transfer level: Needs assistance Equipment used: None Transfers: Sit to/from Stand, Bed to chair/wheelchair/BSC Sit to Stand: Max assist Stand pivot transfers: Max assist         General transfer comment: face to face stand pivot transfer to [facility] and back to recliner     Balance Overall balance assessment: Needs assistance Sitting-balance support: Feet supported, Single extremity supported Sitting balance-Leahy Scale: Poor Sitting balance - Comments: posterior leaning when unsupport in recliner and BSC Postural control: Posterior lean Standing balance support: Bilateral upper extremity supported, During functional activity Standing balance-Leahy Scale: Zero Standing balance comment: reliant on therapist for support                           ADL either performed or assessed with clinical judgement   ADL Overall ADL's : Needs assistance/impaired     Grooming: Wash/dry hands;Wash/dry face;Supervision/safety;Sitting                   Toilet Transfer: Maximal assistance;BSC/3in1 Statistician Details (indicate cue type and reason): stand pivot transfer with face to face technique Toileting- Clothing Manipulation and Hygiene: Total assistance;Sit to/from stand Toileting - Clothing Manipulation Details (indicate cue type and reason): assistance of one for balance and assistance of another to perform toilet hygiene            Extremity/Trunk Assessment              Vision       Perception     Praxis      Cognition Arousal/Alertness: Awake/alert Behavior During Therapy: [facility] for tasks assessed/performed Overall  Cognitive Status: History of cognitive impairments - at baseline                                 General Comments: difficulty expressing needs, asking to sit on couch, EOB,  and BSC        Exercises      Shoulder Instructions       General Comments Pt requesting to use bed pan, NT aware and assisted with bed pan placement in supine; NT to check on her in a few mins for hygiene assist    Pertinent Vitals/ Pain       Pain Assessment Pain Assessment: Faces Faces Pain Scale: Hurts little more Pain Location: bottom Pain Descriptors / Indicators: Grimacing, Guarding, Discomfort Pain Intervention(s): Limited activity within patient's tolerance, Monitored during session, Repositioned  Home Living                                          Prior Functioning/Environment              Frequency  Min 1X/week        Progress Toward Goals  OT Goals(current goals can now be found in the care plan section)  Progress towards OT goals: Progressing toward goals  Acute Rehab OT Goals Patient Stated Goal: none stated OT Goal Formulation: With patient Time For Goal Achievement: 07/24/23 Potential to Achieve Goals: Good ADL Goals Pt Will Perform Grooming: with set-up;with supervision;sitting Pt Will Perform Upper Body Bathing: with supervision;with set-up;sitting Pt Will Perform Lower Body Bathing: with min guard assist;sit to/from stand Pt Will Perform Upper Body Dressing: with supervision;with set-up;sitting Pt Will Perform Lower Body Dressing: with min guard assist;sit to/from stand Pt Will Transfer to Toilet: with min guard assist;ambulating;bedside commode Pt Will Perform Toileting - Clothing Manipulation and hygiene: with min guard assist;sit to/from stand Additional ADL Goal #1: Pt will be S in and OOB for baisc ADLs  Plan Discharge plan remains appropriate    Co-evaluation                 AM-PAC OT "6 Clicks" Daily Activity     Outcome Measure   Help from another person eating meals?: A Little Help from another person taking care of personal grooming?: A Little Help from another person toileting, which includes  using toliet, bedpan, or urinal?: A Lot Help from another person bathing (including washing, rinsing, drying)?: A Lot Help from another person to put on and taking off regular upper body clothing?: A Little Help from another person to put on and taking off regular lower body clothing?: Total 6 Click Score: 14    End of Session Equipment Utilized During Treatment: Gait belt  OT Visit Diagnosis: Other abnormalities of gait and mobility (R26.89);Muscle weakness (generalized) (M62.81);Other symptoms and signs involving cognitive function   Activity Tolerance Patient tolerated treatment well   Patient Left in chair;with call bell/phone within reach;with chair alarm set   Nurse Communication Mobility status;Need for lift equipment        Time: 1610-9604 OT Time Calculation (min): 34 min  Charges: OT General Charges $OT Visit: 1 Visit OT Treatments $Self Care/Home Management : 23-37 mins  Alfonse Flavors, OTA Acute Rehabilitation Services  Office [phone]   Dewain Penning 07/20/2023, 1:17 PM

## 2023-07-20 NOTE — Plan of Care (Signed)

## 2023-07-20 NOTE — Progress Notes (Signed)
Subjective: Trach dressing still in place.  No breathing complaints.  Objective: Vital signs in last 24 hours: Temp:  [97.5 F (36.4 C)-98.2 F (36.8 C)] 98 F (36.7 C) (08/05 0917) Pulse Rate:  [53-70] 53 (08/05 0917) Resp:  [16-18] 16 (08/05 0917) BP: (83-102)/(48-78) 87/74 (08/05 1210) SpO2:  [86 %-100 %] 86 % (08/05 0917) Wt Readings from Last 1 Encounters:  07/16/23 48.1 kg    Intake/Output from previous day: 08/04 0701 - 08/05 0700 In: 240 [P.O.:240] Out: 0  Intake/Output this shift: Total I/O In: 118 [P.O.:118] Out: 0   General appearance: alert, cooperative, and no distress Neck: changed dressing with new tape, trach site still with small opening  Recent Labs    07/19/23 0206 07/19/23 1432 07/20/23 0406  WBC 9.8  --  8.5  HGB 8.3* 8.0* 7.9*  HCT 26.6* 25.5* 25.2*  PLT 181  --  189    Recent Labs    07/19/23 0206 07/20/23 0406  NA 135 131*  K 3.7 4.1  CL 96* 92*  CO2 25 26  GLUCOSE 89 95  BUN 27* 48*  CREATININE 2.62* 4.18*  CALCIUM 8.1* 7.9*    Medications: I have reviewed the patient's current medications.  Assessment/Plan: S/p decannulation  Changed dressing to tape only.  Still small opening.  Will check again in a few days.   LOS: 13 days   [name] 07/20/2023, 12:55 PM

## 2023-07-20 NOTE — Progress Notes (Signed)
Subjective: [name] is a 76 year-old female who has a PMHx of of complete heart block s/p Abbott PPM in 1986, atrial fibrillation, HTN, aortic atherosclerosis, laryngeal stenosis s/p tracheostomy, CVA 04/2019, GI bleed 2012, AVMs, HCM/chronic congestive heart failure, ESRD s/p fistula, COPD who presented with decreased PO intake and melena found to have acute on chronic anemia and GI bleed   Daughter present at bedside. She has been encouraging patient to eat more. Feels weak at her legs   Objective: Vital signs in last 24 hours: Vitals:   07/19/23 1653 07/19/23 2042 07/20/23 0449 07/20/23 0917  BP: (!) 93/50 (!) 95/56 (!) 91/50 102/78  Pulse: 70 70 70 (!) 53  Resp: 16 18 18 16   Temp: (!) 97.5 F (36.4 C) (!) 97.5 F (36.4 C) 98.2 F (36.8 C) 98 F (36.7 C)  TempSrc:  Oral Oral Oral  SpO2: 97% 99% 100% (!) 86%  Weight:      Height:       Weight change:   Intake/Output Summary (Last 24 hours) at 07/20/2023 1045 Last data filed at 07/20/2023 0900 Gross per 24 hour  Intake 238 ml  Output 0 ml  Net 238 ml   Physical Exam: General appearance: frail, alert, awake, NAD Eyes: conjunctivae/corneas clear. PERRL, EOM's intact Neck:  tracheostomy valve removed, dressing in place  Lungs: decreased breath sounds bilaterally, normal WOB, no wheezing   Heart: RRR, S1/S2 normal, systolic murmur at RUSB  Abdomen: soft, NTND Extremities: LUE AV graft fistula in place Neurologic: alert and oriented x3, sensation intact   Lab Results:    Latest Ref Rng & Units 07/20/2023    4:06 AM 07/19/2023    2:32 PM 07/19/2023    2:06 AM  CBC  WBC 4.0 - 10.5 K/uL 8.5   9.8   Hemoglobin 12.0 - 15.0 g/dL 7.9  8.0  8.3   Hematocrit 36.0 - 46.0 % 25.2  25.5  26.6   Platelets 150 - 400 K/uL 189   181       Latest Ref Rng & Units 07/20/2023    4:06 AM 07/19/2023    2:06 AM 07/18/2023    6:43 AM  CMP  Glucose 70 - 99 mg/dL 95  89  161   BUN 8 - 23 mg/dL 48  27  61   Creatinine 0.44 - 1.00 mg/dL  0.96  0.45  4.09   Sodium 135 - 145 mmol/L 131  135  134   Potassium 3.5 - 5.1 mmol/L 4.1  3.7  4.3   Chloride 98 - 111 mmol/L 92  96  95   CO2 22 - 32 mmol/L 26  25  24    Calcium 8.9 - 10.3 mg/dL 7.9  8.1  8.1    Recent Labs    07/17/23 2133  GLUCAP 191*    Micro Results: No results found for this or any previous visit (from the past 240 hour(s)).  Studies/Results: No results found.  Medications: I have reviewed the patient's current medications. Scheduled Meds:  sodium chloride   Intravenous Once   sodium chloride   Intravenous Once   Chlorhexidine Gluconate Cloth  6 each Topical Q0600   darbepoetin (ARANESP) injection - DIALYSIS  60 mcg Subcutaneous Q Tue-1800   donepezil  10 mg Oral QHS   feeding supplement  237 mL Oral BID BM   lidocaine  1 patch Transdermal Q24H   metoprolol succinate  12.5 mg Oral Daily   midodrine  10 mg Oral  Q T,Th,Sa-HD   mirtazapine  7.5 mg Oral QHS   pantoprazole  40 mg Oral BID   rosuvastatin  40 mg Oral QHS   sucralfate  1 g Oral TID AC & HS   Continuous Infusions:  albumin human     PRN Meds:.acetaminophen **OR** acetaminophen, albumin human, senna-docusate  Assessment/Plan: Principal Problem:   Acute on chronic anemia Active Problems:   Current use of long term anticoagulation   Tracheostomy present (HCC)   Melena   ESRD on dialysis (HCC)   Acute encephalopathy   Gastrointestinal hemorrhage   AVM (arteriovenous malformation) of duodenum, acquired with hemorrhage   AVM (arteriovenous malformation) of stomach, acquired with hemorrhage   Protein-calorie malnutrition, severe   Discharge planning issues   Failure to thrive in adult  [name] is a 77 year-old female who has a PMHx of of complete heart block s/p Abbott PPM in 1986, atrial fibrillation, HTN, aortic atherosclerosis, laryngeal stenosis s/p tracheostomy, CVA 04/2019, GI bleed 2012, AVMs, HCM/chronic congestive heart failure, ESRD s/p fistula, COPD who presented  with decreased PO intake and melena found to have acute on chronic anemia and GI bleed   Active Problems   #Melena  UGIB  H/o small bowel AVMs #Acute on chronic anemia, stable   Enteroscopy on 7/25 showed multiple angiodysplastic lesions treated with APC. Received ESA agent on 7/30 which can take a few weeks to have a full effect. S/p 2u pRBCs on 8/3 after having multiple episodes of melenic stool. Hb 7.6. Asymptomatic. Suspect bleeding likely 2/2 to ulcers from recent APC     -GI following, appreciate recs -Cont PO protonix 40mg  BID  -Cont Carafate TID for 2 weeks  -Hold Eliquis 2.5mg  BID until 8/7  -PT/OT  -If Hb falls below 7, consider 1u pRBC transfusion   #Poor PO intake Needing help with feeding. Daughter present at bedside. Encourage increased intake    -Dietician following -Feeding supplement  -Monitor daily weights  -Consider involving palliative care for GOC if worsening    #ESRD  AV graft fistula #Hypomagnesia, resolved   Hemodialysis schedule T/Th/Sat. Needs to tolerate dialysis in chair for outpatient considerations. Scheduled next for 8/6   -Received first dose of 2-dose series HBV vaccine. Plan for 2nd dose in 1 month outpatient  -Daily BMP    #Trach collar (removed)  H/o laryngeal stenosis  Decannulated tracheostomy tube on 8/1   -Monitor vitals  -SLP following   #Dementia  Reported onset of dementia in 2021. -Continue home donepezil 10mg  daily    Chronic Problems   #Atrial fibrillation, controlled #HLD, controlled  #H/o stroke Reported stroke on 04/2019 with residual expressive aphasia. Last lipid panel showed LDL 55 (05/29/23) -Cont home rosuvastatin 40mg   -Held Eliquis 2.5mg  BID    #Chronic CHF  HCM  #Complete heart block s/p PPM (1986)  Follows with HF clinic.   -Continue metoprolol succinate 12.5mg  daily  -May consider home diuretic on non-dialysis days based on volume status      #HTN, controlled -Continue to monitor  This is a  Psychologist, occupational Note.  The care of the patient was discussed with Dr. Thomasene Ripple  and the assessment and plan formulated with their assistance.  Please see their attached note for official documentation of the daily encounter.   LOS: 13 days   Loyal Buba 07/20/2023, 10:45 AM

## 2023-07-20 NOTE — Progress Notes (Addendum)
Physical Therapy Treatment Patient Details Name: [name] MRN: [id number] DOB: September 24, 1946 Today's Date: 07/20/2023   History of Present Illness 77 y.o. female presents to [facility] on 06/26/2023 with symptomatic anemia and FTT. Small bowel enteroscopy on 7/25. Tracheostomy decannulated 8/1. PMH includes afib, ESRD, COPD, CVA, CHF, laryngeal stenosis s/p tracheostomy 2017, chronic GIB, dementia.    PT Comments  Pt received in recliner, agreeable to therapy session and c/o bowel urgency. Pt needing maxA for face to face stand pivot from chair>bed and had difficulty fully extending hips/knees while standing. Pt with poor seated balance needing single UE support and variable min guard to modA with posterior LOB when letting go of bed side rail. Pt up on bed pan at end of session, defer BSC due to pt soft BP in recliner prior to transferring, RN/NT aware.   07/20/23 1153  Vital Signs  BP (!) 83/48  BP Location Right Arm  BP Method Automatic  Patient Position (if appropriate) Sitting (reclined in chair)   BP (!) 87/74  BP Location Right Arm  BP Method Automatic  Patient Position (if appropriate) Lying     If plan is discharge home, recommend the following: A lot of help with bathing/dressing/bathroom;Assistance with cooking/housework;Assist for transportation;Help with stairs or ramp for entrance;Direct supervision/assist for medications management;Direct supervision/assist for financial management;Two people to help with walking and/or transfers   Can travel by private vehicle        Equipment Recommendations  Other (comment) (currently at hoyer/wheelchair level)    Recommendations for Other Services       Precautions / Restrictions Precautions Precautions: Fall Precaution Comments: Trach decannulated 8/1, watch BP Restrictions Weight Bearing Restrictions: No     Mobility  Bed Mobility Overal bed mobility: Needs Assistance Bed Mobility: Rolling, Sit to  Sidelying Rolling: Min assist, Mod assist       Sit to sidelying: Mod assist, +2 for safety/equipment General bed mobility comments: assistance to guide her head/trunk back down and to lift BLE. Rolling to L side x2 for bed pan placement needs hand over hand assist and modA to roll far enough for placement of bed pan.    Transfers Overall transfer level: Needs assistance Equipment used: 2 person hand held assist Transfers: Sit to/from Stand, Bed to chair/wheelchair/BSC Sit to Stand: Max assist Stand pivot transfers: +2 safety/equipment, Max assist         General transfer comment: Face to face STS and stand pivot from chair>EOB, defer prolonged standing given soft initial BP in the recliner and pt nods yes to lightheadedness when prompted.    Ambulation/Gait               General Gait Details: Pt not yet able to weight shift in stance with face to face stand pivot.        Balance Overall balance assessment: Needs assistance Sitting-balance support: Feet supported, Single extremity supported Sitting balance-Leahy Scale: Poor Sitting balance - Comments: needs to hold rail to maintain upright at EOB, min guard for safety and x1 posterior LOB when pt let go of rail requiring mod/maxA to recover Postural control: Posterior lean Standing balance support: Bilateral upper extremity supported, During functional activity Standing balance-Leahy Scale: Zero Standing balance comment: pt with difficulty maintaining bil knees/hips extended during stand pivot tf, defer gait due to soft BP in chair and pt lightheadedness  Cognition Arousal/Alertness: Awake/alert Behavior During Therapy: WFL for tasks assessed/performed Overall Cognitive Status: History of cognitive impairments - at baseline                                 General Comments: Following ~70% of commands with increased time. Pt reporting need to use bed pan, has  difficulty following instructions for stand pivot transfer despite verbal and tactile cues given in advance.        Exercises Other Exercises Other Exercises: defer due to pt lightheadedness and bowel urgency    General Comments General comments (skin integrity, edema, etc.): Pt requesting to use bed pan, NT aware and assisted with bed pan placement in supine; NT to check on her in a few mins for hygiene assist Geomat cushion ordered per PTA request by unit secretary and in her chair for pressure relief.     Pertinent Vitals/Pain Pain Assessment Pain Assessment: PAINAD Breathing: normal Negative Vocalization: occasional moan/groan, low speech, negative/disapproving quality Facial Expression: sad, frightened, frown Body Language: tense, distressed pacing, fidgeting Consolability: distracted or reassured by voice/touch PAINAD Score: 4 Pain Location: not localized, likely bottom based on pt grimacing intermittently when sitting down Pain Descriptors / Indicators: Grimacing, Guarding, Discomfort Pain Intervention(s): Limited activity within patient's tolerance, Monitored during session, Repositioned           PT Goals (current goals can now be found in the care plan section) Acute Rehab PT Goals Patient Stated Goal: to return to ambulation per daughter PT Goal Formulation: With patient Time For Goal Achievement: 07/24/23 Progress towards PT goals: Progressing toward goals    Frequency    Min 3X/week      PT Plan Current plan remains appropriate       AM-PAC PT "6 Clicks" Mobility   Outcome Measure  Help needed turning from your back to your side while in a flat bed without using bedrails?: A Little Help needed moving from lying on your back to sitting on the side of a flat bed without using bedrails?: A Lot Help needed moving to and from a bed to a chair (including a wheelchair)?: Total Help needed standing up from a chair using your arms (e.g., wheelchair or bedside  chair)?: Total (+1-2 maxA with Stedy lift) Help needed to walk in hospital room?: Total Help needed climbing 3-5 steps with a railing? : Total 6 Click Score: 9    End of Session Equipment Utilized During Treatment: Gait belt Activity Tolerance: Patient tolerated treatment well;Treatment limited secondary to medical complications (Comment);Other (comment);Patient limited by fatigue (soft BP in chair, bowel urgency) Patient left: with call bell/phone within reach;Other (comment);in bed;with bed alarm set (heels floated, on bed pan, NT/RN aware) Nurse Communication: Mobility status;Need for lift equipment;Other (comment) (+2 Stedy vs hoyer chair<>bed, pt needs smaller compression socks placed as the ones on her legs appear baggy) PT Visit Diagnosis: Other abnormalities of gait and mobility (R26.89);Muscle weakness (generalized) (M62.81)     Time: 1610-9604 PT Time Calculation (min) (ACUTE ONLY): 21 min  Charges:    $Therapeutic Activity: 8-22 mins PT General Charges $$ ACUTE PT VISIT: 1 Visit                     [name] Kallman P., PTA Acute Rehabilitation Services Secure Chat Preferred 9a-5:30pm Office: [phone]    Dorathy Kinsman [facility] 07/20/2023, 12:29 PM

## 2023-07-20 NOTE — Progress Notes (Signed)
Spoke to inpt HD RN today who also worked on Saturday who states that pt did receive HD in the chair on Saturday and pt tolerated it ok. Case discussed with nephrologist who is planing for HD in the chair tomorrow as well. Will assist as needed.   Olivia Canter Renal Navigator [phone]

## 2023-07-20 NOTE — Progress Notes (Signed)
[facility] Progress Note   Assessment/ Plan:   [name] is a/an 77 y.o. female with a past medical history significant for CKD V, CHF, GI bleeding/AVMs, DM2, afib, dementia, frailty, admitted for GI bleeding and ARF.          AKI on CKD V --> ESRD.  Family wants to pursue dialysis             - Continue HD on TTS schedule- HD /30             -  ESRD CLIP  - decannulated 07/15/23             - continue with AVG, working well             -  midodrine 10mg  with HD             - pal care involved             - needs to sit in chair for HD- according to documentation looks like was in bed for Saturday  - next HD Tuesday   2.  Hyperkalemia: resolved   3.  Anemia:             - suspect GI bleed on top of anemia of renal failure             - s/p push enteroscopy             - transfusions 8/2 2 u pRBCs  - GI re-consulted 8/2             - Start esa on Tuesday   4.  BMM             - PTH 425, phos at goal             - ca corrects to normal when accounting for albumin   5.  Dementia:             - on Aricept             - pal care involved      6. Nutrition: albumin 2. Push protein  Subjective:    Working with PT- BP is lowish-  pt did not remember getting HD on Sat-- asks me how her kidneys are doing    Objective:   BP 102/78 (BP Location: Right Arm)   Pulse (!) 53   Temp 98 F (36.7 C) (Oral)   Resp 16   Ht 5\' 5"  (1.651 m)   Wt 48.1 kg   SpO2 (!) 86%   BMI 17.65 kg/m   Physical Exam: ZOX:WRUEA in bed, getting cleaned up CVS: RRR Resp: clear, trach site bandaged Abd: soft Ext: no LE edema ACCESS: AVG  Labs: BMET Recent Labs  Lab 07/14/23 0013 07/15/23 0416 07/16/23 0256 07/17/23 0903 07/18/23 0643 07/19/23 0206 07/20/23 0406  NA 134* 133* 129* 134* 134* 135 131*  K 4.5 3.6 3.7 4.2 4.3 3.7 4.1  CL 100 94* 92* 94* 95* 96* 92*  CO2 22 28 25 26 24 25 26   GLUCOSE 119* 94 112* 109* 121* 89 95  BUN 44* 21 36* 32* 61* 27* 48*   CREATININE 4.53* 3.09* 4.39* 3.37* 4.42* 2.62* 4.18*  CALCIUM 7.9* 8.2* 8.0* 8.1* 8.1* 8.1* 7.9*  PHOS  --   --   --   --  2.5  --   --    CBC Recent Labs  Lab 07/18/23 0643 07/18/23 1210 07/19/23 0206  07/19/23 1432 07/20/23 0406  WBC 12.5* 14.1* 9.8  --  8.5  NEUTROABS  --  11.8* 8.1*  --   --   HGB 10.3* 9.3* 8.3* 8.0* 7.9*  HCT 32.0* 27.8* 26.6* 25.5* 25.2*  MCV 89.1 88.0 88.7  --  90.0  PLT 196 178 181  --  189      Medications:     sodium chloride   Intravenous Once   sodium chloride   Intravenous Once   Chlorhexidine Gluconate Cloth  6 each Topical Q0600   darbepoetin (ARANESP) injection - DIALYSIS  60 mcg Subcutaneous Q Tue-1800   donepezil  10 mg Oral QHS   feeding supplement  237 mL Oral BID BM   lidocaine  1 patch Transdermal Q24H   metoprolol succinate  12.5 mg Oral Daily   midodrine  10 mg Oral Q T,Th,Sa-HD   mirtazapine  7.5 mg Oral QHS   pantoprazole  40 mg Oral BID   rosuvastatin  40 mg Oral QHS   sucralfate  1 g Oral TID AC & HS     A   07/20/2023, 11:52 AM

## 2023-07-20 NOTE — TOC Progression Note (Signed)
Transition of Care [facility]) - Initial/Assessment Note    Patient Details  Name: [name] MRN: [id number] Date of Birth: 07/23/1946  [facility]) CM/SW Contact:    Ralene Bathe, LCSW Phone Number: 07/20/2023, 1:04 PM  Clinical Narrative:                 LCSW received call from [facility] social worker, Marylene Land.  Update provided.  TOC following.        Patient Goals and CMS Choice            Expected Discharge Plan and Services                                              Prior Living Arrangements/Services                       Activities of Daily Living Home Assistive Devices/Equipment: None ADL Screening (condition at time of admission) Patient's cognitive ability adequate to safely complete daily activities?: Yes Is the patient deaf or have difficulty hearing?: No Does the patient have difficulty seeing, even when wearing glasses/contacts?: No Does the patient have difficulty concentrating, remembering, or making decisions?: Yes Patient able to express need for assistance with ADLs?: Yes Does the patient have difficulty dressing or bathing?: Yes Independently performs ADLs?: No Does the patient have difficulty walking or climbing stairs?: Yes Weakness of Legs: Both Weakness of Arms/Hands: None  Permission Sought/Granted                  Emotional Assessment              Admission diagnosis:  Hyperkalemia [E87.5] Acute on chronic anemia [D64.9] Patient Active Problem List   Diagnosis Date Noted   Failure to thrive in adult 07/12/2023   Protein-calorie malnutrition, severe 07/10/2023   Discharge planning issues 07/10/2023   AVM (arteriovenous malformation) of duodenum, acquired with hemorrhage 07/09/2023   AVM (arteriovenous malformation) of stomach, acquired with hemorrhage 07/09/2023   Melena 07/08/2023   ESRD on dialysis (HCC) 07/08/2023   Acute encephalopathy 07/08/2023   Gastrointestinal hemorrhage  07/08/2023   Acute on chronic anemia 07/07/2023   AKI (acute kidney injury) (HCC) 04/23/2020   Cough with hemoptysis 08/02/2019   SOB (shortness of breath)    Atypical chest pain    Chest pain on exertion 05/15/2019   Cerebral embolism with cerebral infarction 05/09/2019   Expressive aphasia 05/08/2019   Other abnormal glucose 11/26/2018   Polypharmacy 06/25/2018   Depression with anxiety    Tracheostomy present (HCC)    Tracheostomy care (HCC)    Calculus of gallbladder without cholecystitis without obstruction 12/24/2017   Chronic cough 09/25/2017   History of colonic polyps    Polyp of transverse colon    Polyp of sigmoid colon    Moderate protein-calorie malnutrition (HCC) 06/02/2017   Chronic idiopathic constipation 03/19/2017   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   Permanent atrial fibrillation (HCC)    Laryngeal stenosis 09/18/2016   Esophageal dysphagia    Polymyalgia rheumatica (HCC) 04/10/2015   Hypertrophic cardiomyopathy (HCC) 02/15/2015   Eczema 09/18/2014   Environmental allergies 09/18/2014   Pre-diabetes 11/26/2013   COPD, moderate (HCC) 11/01/2013   Complete heart block (HCC) 04/03/2011   Acute on chronic diastolic heart failure (HCC) 02/20/2011   Anemia 02/17/2011   AVM (arteriovenous malformation) of small bowel,  acquired 02/13/2011   Current use of long term anticoagulation 01/15/2011   PACEMAKER-St.Jude 11/28/2010   CKD (chronic kidney disease) stage 4, GFR 15-29 ml/min (HCC) 09/24/2010   Acute on chronic congestive heart failure (HCC) 08/30/2010   ARTHRITIS 07/24/2010   Generalized anxiety disorder 07/23/2010   Hyperlipidemia 02/11/2007   Essential hypertension 02/11/2007   APNEA, SLEEP 02/11/2007   PCP:  Inc, [facility] Pharmacy:   [facility] - [place], [place] - [address] [place] [address] Phone: [phone] Fax: [phone]     Social Determinants of Health  (SDOH) Social History: SDOH Screenings   Food Insecurity: No Food Insecurity (07/13/2023)  Housing: Low Risk  (07/13/2023)  Transportation Needs: No Transportation Needs (07/13/2023)  Utilities: Not At Risk (07/13/2023)  Depression (PHQ2-9): Low Risk  (05/20/2019)  Tobacco Use: Medium Risk (07/09/2023)   SDOH Interventions: Transportation Interventions: Intervention Not Indicated, Inpatient TOC, Patient Resources (Friends/Family)   Readmission Risk Interventions    07/08/2023    3:26 PM  Readmission Risk Prevention Plan  Transportation Screening Complete  PCP or Specialist Appt within 3-5 Days Complete  HRI or Home Care Consult Complete  Social Work Consult for Recovery Care Planning/Counseling Complete  Palliative Care Screening Not Applicable  Medication Review Oceanographer) Referral to Pharmacy

## 2023-07-21 DIAGNOSIS — D649 Anemia, unspecified: Secondary | ICD-10-CM | POA: Diagnosis not present

## 2023-07-21 DIAGNOSIS — Z992 Dependence on renal dialysis: Secondary | ICD-10-CM | POA: Diagnosis not present

## 2023-07-21 DIAGNOSIS — N186 End stage renal disease: Secondary | ICD-10-CM | POA: Diagnosis not present

## 2023-07-21 DIAGNOSIS — E43 Unspecified severe protein-calorie malnutrition: Secondary | ICD-10-CM | POA: Diagnosis not present

## 2023-07-21 MED ORDER — ALBUMIN HUMAN 25 % IV SOLN
INTRAVENOUS | Status: AC
  Filled 2023-07-21: qty 100

## 2023-07-21 MED ORDER — ALUM & MAG HYDROXIDE-SIMETH 200-200-20 MG/5ML PO SUSP
30.0000 mL | ORAL | Status: DC | PRN
  Administered 2023-07-21: 30 mL via ORAL
  Filled 2023-07-21: qty 30

## 2023-07-21 MED ORDER — CALCITRIOL 0.25 MCG PO CAPS
0.2500 ug | ORAL_CAPSULE | ORAL | Status: DC
  Administered 2023-07-21 – 2023-07-23 (×2): 0.25 ug via ORAL
  Filled 2023-07-21 (×2): qty 1

## 2023-07-21 MED ORDER — SODIUM CHLORIDE 0.9 % IV SOLN
250.0000 mg | Freq: Every day | INTRAVENOUS | Status: AC
  Administered 2023-07-21 – 2023-07-24 (×4): 250 mg via INTRAVENOUS
  Filled 2023-07-21 (×4): qty 20

## 2023-07-21 MED ORDER — METOPROLOL SUCCINATE ER 25 MG PO TB24
12.5000 mg | ORAL_TABLET | Freq: Every day | ORAL | Status: DC
  Filled 2023-07-21: qty 1

## 2023-07-21 MED ORDER — MIDODRINE HCL 5 MG PO TABS
10.0000 mg | ORAL_TABLET | Freq: Two times a day (BID) | ORAL | Status: DC
  Administered 2023-07-21 – 2023-07-24 (×5): 10 mg via ORAL
  Filled 2023-07-21 (×6): qty 2

## 2023-07-21 NOTE — Progress Notes (Signed)
[facility] Progress Note   Assessment/ Plan:   Canyon Yando is a/an 77 y.o. female with a past medical history significant for CKD V, CHF, GI bleeding/AVMs, DM2, afib, dementia, frailty, admitted for GI bleeding and ARF.          AKI on CKD V --> ESRD.  Family wants to pursue dialysis             - Continue HD on TTS schedule- next due on 8/8             -  ESRD CLIP-  hoping for [facility] tts second- is in review               - continue with AVG, working well             -  midodrine 10mg  with HD-  will inc to bid every day             - pal care involved             - needs to sit in chair for HD- did so on Sat and today    2.  Hyperkalemia: resolved   3.  Anemia:             - suspect GI bleed on top of anemia of renal failure             - s/p push enteroscopy             - transfusions 8/2 2 u pRBCs- iron lower earler in hosp-  will give iron                - Start esa on Tuesday-   will inc dose- fairly stable 8/4-  8/6   4.  BMM             - PTH 425, add vitamin D- phos at goal             - ca corrects to normal when accounting for albumin   5.  Dementia:             - on Aricept             - pal care involved     6. Nutrition: albumin 2. Push protein  Subjective:    Seen in HD-  says she does not feel great but can't articulate why but NAD-  BP is lowish-  have modified goal   Objective:   BP 96/60 (BP Location: Right Arm)   Pulse 70   Temp (!) 97.5 F (36.4 C) (Oral)   Resp 20   Ht 5\' 5"  (1.651 m)   Wt 48.1 kg   SpO2 98%   BMI 17.65 kg/m   Physical Exam: YQM:VHQIO in bed, getting cleaned up CVS: RRR Resp: clear, trach site bandaged Abd: soft Ext: no LE edema ACCESS: AVG  Labs: BMET Recent Labs  Lab 07/15/23 0416 07/16/23 0256 07/17/23 0903 07/18/23 0643 07/19/23 0206 07/20/23 0406 08/14/2023 0840  NA 133* 129* 134* 134* 135 131* 133*  K 3.6 3.7 4.2 4.3 3.7 4.1 4.4  CL 94* 92* 94* 95* 96* 92* 93*  CO2 28 25 26 24 25 26  26   GLUCOSE 94 112* 109* 121* 89 95 119*  BUN 21 36* 32* 61* 27* 48* 72*  CREATININE 3.09* 4.39* 3.37* 4.42* 2.62* 4.18* 5.50*  CALCIUM 8.2* 8.0* 8.1* 8.1* 8.1* 7.9* 8.2*  PHOS  --   --   --  2.5  --   --   --    CBC Recent Labs  Lab 07/18/23 1210 07/19/23 0206 07/19/23 1432 07/20/23 0406 08/15/2023 0840  WBC 14.1* 9.8  --  8.5 8.6  NEUTROABS 11.8* 8.1*  --   --   --   HGB 9.3* 8.3* 8.0* 7.9* 7.8*  HCT 27.8* 26.6* 25.5* 25.2* 24.5*  MCV 88.0 88.7  --  90.0 92.1  PLT 178 181  --  189 204      Medications:     sodium chloride   Intravenous Once   sodium chloride   Intravenous Once   Chlorhexidine Gluconate Cloth  6 each Topical Q0600   darbepoetin (ARANESP) injection - DIALYSIS  60 mcg Subcutaneous Q Tue-1800   donepezil  10 mg Oral QHS   feeding supplement  237 mL Oral BID BM   lidocaine  1 patch Transdermal Q24H   metoprolol succinate  12.5 mg Oral Daily   midodrine  10 mg Oral Q T,Th,Sa-HD   mirtazapine  7.5 mg Oral QHS   pantoprazole  40 mg Oral BID   rosuvastatin  40 mg Oral QHS   sucralfate  1 g Oral TID AC & HS    Arthur Speagle A Amore Grater  07/23/2023, 11:35 AM

## 2023-07-21 NOTE — Progress Notes (Signed)
Subjective: [name] is a 77 year-old female who has a PMHx of of complete heart block s/p Abbott PPM in 1986, atrial fibrillation, HTN, aortic atherosclerosis, laryngeal stenosis s/p tracheostomy, CVA 04/2019, GI bleed 2012, AVMs, HCM/chronic congestive heart failure, ESRD s/p fistula, COPD who presented with decreased PO intake and melena found to have acute on chronic anemia and GI bleed   At dialysis. Tolerating HD while in chair x2. Denies any acute complaints   Objective: Vital signs in last 24 hours: Vitals:    0930  1000  1030  1100  BP: (!) 94/56 (!) 103/51 (!) 95/42 96/60  Pulse: 70 67 70 70  Resp: 19 20 (!) 21 20  Temp:      TempSrc:      SpO2: 100% 96% 96% 98%  Weight:      Height:       Weight change:   Intake/Output Summary (Last 24 hours) at  1119 Last data filed at 07/20/2023 2223 Gross per 24 hour  Intake 50 ml  Output 0 ml  Net 50 ml   Physical Exam: General appearance: frail, alert, awake, NAD Eyes: conjunctivae/corneas clear. PERRL, EOM's intact Neck:  tracheostomy valve removed, dressing in place  Lungs: decreased breath sounds bilaterally, normal WOB, no wheezing or crackles   Heart: RRR, S1/S2 normal, faint systolic murmur at RUSB  Abdomen: soft, NTND Extremities: LUE AV graft fistula in place Neurologic: alert and oriented x3, sensation intact   Lab Results:    Latest Ref Rng & Units     8:40 AM 07/20/2023    4:06 AM 07/19/2023    2:32 PM  CBC  WBC 4.0 - 10.5 K/uL 8.6  8.5    Hemoglobin 12.0 - 15.0 g/dL 7.8  7.9  8.0   Hematocrit 36.0 - 46.0 % 24.5  25.2  25.5   Platelets 150 - 400 K/uL 204  189        Latest Ref Rng & Units     8:40 AM 07/20/2023    4:06 AM 07/19/2023    2:06 AM  CMP  Glucose 70 - 99 mg/dL 366  95  89   BUN 8 - 23 mg/dL 72  48  27   Creatinine 0.44 - 1.00 mg/dL 4.40  3.47  4.25   Sodium 135 - 145 mmol/L 133  131  135   Potassium 3.5 - 5.1 mmol/L 4.4  4.1  3.7    Chloride 98 - 111 mmol/L 93  92  96   CO2 22 - 32 mmol/L 26  26  25    Calcium 8.9 - 10.3 mg/dL 8.2  7.9  8.1    No results for input(s): "GLUCAP" in the last 72 hours.   Micro Results: No results found for this or any previous visit (from the past 240 hour(s)).  Studies/Results: No results found.  Medications: I have reviewed the patient's current medications. Scheduled Meds:  sodium chloride   Intravenous Once   sodium chloride   Intravenous Once   Chlorhexidine Gluconate Cloth  6 each Topical Q0600   darbepoetin (ARANESP) injection - DIALYSIS  60 mcg Subcutaneous Q Tue-1800   donepezil  10 mg Oral QHS   feeding supplement  237 mL Oral BID BM   lidocaine  1 patch Transdermal Q24H   metoprolol succinate  12.5 mg Oral Daily   midodrine  10 mg Oral Q T,Th,Sa-HD   mirtazapine  7.5 mg Oral QHS   pantoprazole  40 mg Oral BID  rosuvastatin  40 mg Oral QHS   sucralfate  1 g Oral TID AC & HS   Continuous Infusions:  albumin human     PRN Meds:.acetaminophen **OR** acetaminophen, albumin human, alum & mag hydroxide-simeth, senna-docusate  Assessment/Plan: Principal Problem:   Acute on chronic anemia Active Problems:   Current use of long term anticoagulation   Tracheostomy present (HCC)   Melena   ESRD on dialysis (HCC)   Acute encephalopathy   Gastrointestinal hemorrhage   AVM (arteriovenous malformation) of duodenum, acquired with hemorrhage   AVM (arteriovenous malformation) of stomach, acquired with hemorrhage   Protein-calorie malnutrition, severe   Discharge planning issues   Failure to thrive in adult  [name] is a 77 year-old female who has a PMHx of of complete heart block s/p Abbott PPM in 1986, atrial fibrillation, HTN, aortic atherosclerosis, laryngeal stenosis s/p tracheostomy, CVA 04/2019, GI bleed 2012, AVMs, HCM/chronic congestive heart failure, ESRD s/p fistula, COPD who presented with decreased PO intake and melena found to have acute on  chronic anemia and GI bleed   Active Problems   #Angiodysplastic lesions s/p enteroscopy  UGIB  H/o small bowel AVMs #Acute on chronic anemia, stable   Enteroscopy on 7/25 showed multiple angiodysplastic lesions treated with APC. Received ESA agent on 7/30 which can take a few weeks to have a full effect. Hb 7.9. Had soft blood pressures. Suspect drop in hemoglobin following RBC transfusion 2/2 to ulcers from recent APC     -Cont PO protonix 40mg  BID  -Cont Carafate TID for 2 weeks  -Hold Eliquis 2.5mg  BID until 8/7  -PT/OT  -If Hb falls below 7, consider 1u pRBC transfusion   #Poor PO intake Monitor intake  -Dietician following -Feeding supplement  -Monitor daily weights  -Consider involving palliative care for GOC if worsening    #ESRD  AV graft fistula Hemodialysis schedule T/Th/Sat. Tolerated dialysis in chair x2. Await outpatient clinic approval    -Received first dose of 2-dose series HBV vaccine (7/29). Plan for 2nd dose in 23-month outpatient  -Midodrine 10mg  on dialysis days  -Daily BMP    #Trach collar (removed)  H/o laryngeal stenosis, stable  Decannulated tracheostomy tube on 8/1   -Monitor vitals  -SLP following   #Dementia  Reported onset of dementia in 2021. -Continue home donepezil 10mg  daily    Chronic Problems   #Atrial fibrillation, controlled #HLD, controlled  #H/o stroke Reported stroke on 04/2019 with residual expressive aphasia. Last lipid panel showed LDL 55 (05/29/23) -Cont home rosuvastatin 40mg   -Held Eliquis 2.5mg  BID    #Chronic CHF  HCM  #Complete heart block s/p PPM (1986)  Follows with HF clinic.   -Continue metoprolol succinate 12.5mg  daily  -May consider home diuretic on non-dialysis days based on volume status      #HTN, controlled -Continue to monitor  This is a Psychologist, occupational Note.  The care of the patient was discussed with Dr. Thomasene [name]  and the assessment and plan formulated with their assistance.  Please see their  attached note for official documentation of the daily encounter.   LOS: 14 days   [name] , 11:19 AM

## 2023-07-21 NOTE — Progress Notes (Signed)
Foul smelling vaginal discharge noted during pericare. Internal Medicine paged.  Awaiting call back. Report given to receiving nurse.

## 2023-07-21 NOTE — TOC Progression Note (Signed)
Transition of Care [facility]) - Initial/Assessment Note    Patient Details  Name: [name] MRN: [id number] Date of Birth: 30-Nov-1946  [facility]) CM/SW Contact:    Ralene Bathe, LCSW Phone Number: , 3:50 PM  Clinical Narrative:             LCSW contacted [facility] (SNF).  The facility can accept the patient tomorrow as she has been clipped to an out-pt HD clinic.  [facility] social worker, Marylene Land, informed of the above and given out-pt. HD information.  [facility] will transport the patient to the facility tomorrow.  TOC following.          Patient Goals and CMS Choice            Expected Discharge Plan and Services                                              Prior Living Arrangements/Services                       Activities of Daily Living Home Assistive Devices/Equipment: None ADL Screening (condition at time of admission) Patient's cognitive ability adequate to safely complete daily activities?: Yes Is the patient deaf or have difficulty hearing?: No Does the patient have difficulty seeing, even when wearing glasses/contacts?: No Does the patient have difficulty concentrating, remembering, or making decisions?: Yes Patient able to express need for assistance with ADLs?: Yes Does the patient have difficulty dressing or bathing?: Yes Independently performs ADLs?: No Does the patient have difficulty walking or climbing stairs?: Yes Weakness of Legs: Both Weakness of Arms/Hands: None  Permission Sought/Granted                  Emotional Assessment              Admission diagnosis:  Hyperkalemia [E87.5] Acute on chronic anemia [D64.9] Patient Active Problem List   Diagnosis Date Noted   Hyponatremia 07/20/2023   Failure to thrive in adult 07/12/2023   Protein-calorie malnutrition, severe 07/10/2023   Discharge planning issues 07/10/2023   AVM (arteriovenous malformation) of duodenum, acquired with hemorrhage  07/09/2023   AVM (arteriovenous malformation) of stomach, acquired with hemorrhage 07/09/2023   Melena 07/08/2023   ESRD on dialysis (HCC) 07/08/2023   Acute encephalopathy 07/08/2023   Gastrointestinal hemorrhage 07/08/2023   Acute on chronic anemia 07/07/2023   AKI (acute kidney injury) (HCC) 04/23/2020   Cough with hemoptysis 08/02/2019   SOB (shortness of breath)    Atypical chest pain    Chest pain on exertion 05/15/2019   Cerebral embolism with cerebral infarction 05/09/2019   Expressive aphasia 05/08/2019   Other abnormal glucose 11/26/2018   Polypharmacy 06/25/2018   Depression with anxiety    Tracheostomy present [facility])    Tracheostomy care (HCC)    Calculus of gallbladder without cholecystitis without obstruction 12/24/2017   Chronic cough 09/25/2017   History of colonic polyps    Polyp of transverse colon    Polyp of sigmoid colon    Moderate protein-calorie malnutrition (HCC) 06/02/2017   Chronic idiopathic constipation 03/19/2017   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   Permanent atrial fibrillation (HCC)    Laryngeal stenosis 09/18/2016   Esophageal dysphagia    Polymyalgia rheumatica (HCC) 04/10/2015   Hypertrophic cardiomyopathy (HCC) 02/15/2015   Eczema 09/18/2014   Environmental allergies 09/18/2014  Pre-diabetes 11/26/2013   COPD, moderate (HCC) 11/01/2013   Complete heart block (HCC) 04/03/2011   Acute on chronic diastolic heart failure (HCC) 02/20/2011   Anemia 02/17/2011   AVM (arteriovenous malformation) of small bowel, acquired 02/13/2011   Current use of long term anticoagulation 01/15/2011   PACEMAKER-St.Jude 11/28/2010   CKD (chronic kidney disease) stage 4, GFR 15-29 ml/min (HCC) 09/24/2010   Acute on chronic congestive heart failure (HCC) 08/30/2010   ARTHRITIS 07/24/2010   Generalized anxiety disorder 07/23/2010   Hyperlipidemia 02/11/2007   Essential hypertension 02/11/2007   APNEA, SLEEP 02/11/2007   PCP:  Inc, [facility] Pharmacy:   [facility] - [place], [place] - [address] [place] [address] Phone: [phone] Fax: [phone]     Social Determinants of Health (SDOH) Social History: SDOH Screenings   Food Insecurity: No Food Insecurity (07/13/2023)  Housing: Low Risk  (07/13/2023)  Transportation Needs: No Transportation Needs (07/13/2023)  Utilities: Not At Risk (07/13/2023)  Depression (PHQ2-9): Low Risk  (05/20/2019)  Tobacco Use: Medium Risk (07/09/2023)   SDOH Interventions: Transportation Interventions: Intervention Not Indicated, Inpatient TOC, Patient Resources (Friends/Family)   Readmission Risk Interventions    07/08/2023    3:26 PM  Readmission Risk Prevention Plan  Transportation Screening Complete  PCP or Specialist Appt within 3-5 Days Complete  HRI or Home Care Consult Complete  Social Work Consult for Recovery Care Planning/Counseling Complete  Palliative Care Screening Not Applicable  Medication Review Oceanographer) Referral to Pharmacy

## 2023-07-21 NOTE — Progress Notes (Signed)
Received patient in bed to unit.  Alert and oriented.  Informed consent signed and in chart.   TX duration: 3 hours 15 min  Patient tolerated well.  Transported back to the room  Alert, without acute distress.  Hand-off given to patient's nurse.   Access used: LUA Graft Access issues: none  Total UF removed: Medication(s) given: midodrine, albumin, see MAR    1206  Vitals  Temp (!) 97.3 F (36.3 C)  Temp Source Oral  BP (!) 101/55  MAP (mmHg) 69  BP Location Right Arm  BP Method Automatic  Patient Position (if appropriate) Sitting  Pulse Rate 70  Pulse Rate Source Monitor  ECG Heart Rate 70  Resp (!) 21  Oxygen Therapy  SpO2 100 %  O2 Device Nasal Cannula  O2 Flow Rate (L/min) 2 L/min  Patient Activity (if Appropriate) In chair     Stacie Glaze LPN Kidney Dialysis Unit

## 2023-07-21 NOTE — Plan of Care (Signed)

## 2023-07-21 NOTE — Progress Notes (Signed)
Speech Language Pathology Treatment: Dysphagia  Patient Details Name: [name] MRN: [id number] DOB: 11/25/1946 Today's Date:  Time: 1027-2536 SLP Time Calculation (min) (ACUTE ONLY): 18 min  Assessment / Plan / Recommendation Clinical Impression  Pt observed with Dys 3 lunch tray consisting of pot roast and mashed potatoes. She reports no difficulty with prior trays of this texture, although today had increased difficulty masticating pot roast. No s/s of aspiration observed throughout all trials of solids or thin liquids. Education provided regarding ordering tender meats from the menu, taking small bites, and following bites with sips. Recommend continuing diet of Dys 3 textures with thin liquids and full supervision to assist with self-feeding. No further SLP f/u is necessary as she is at her baseline level of function and baseline diet. Will s/o.    HPI HPI: Patient is a 77 y.o. female with PMH: dementia, complete heart block s/p Abbott PPM in 1986, atrial fibrillation, HTN, aortic atherosclerosis, laryngeal stenosis s/p tracheostomy (2017), CVA 04/2019, GI bleed 2012, AVMs, HCM/chronic congestive heart failure, ESRD s/p fistula, COPD who presented to the hospital on 07/07/23 with decreased PO intake and melena found to have acute on chronic anemia and GI bleed. She was started on a full liquids diet on 07/09/23, Her trach was capped on 7/27. On 7/29, RD contacted MD due to patient wanting solid food and so SLP swallow evaluation was ordered.      SLP Plan  All goals met      Recommendations for follow up therapy are one component of a multi-disciplinary discharge planning process, led by the attending physician.  Recommendations may be updated based on patient status, additional functional criteria and insurance authorization.    Recommendations  Diet recommendations: Dysphagia 3 (mechanical soft);Thin liquid Liquids provided via: Cup;Straw Medication Administration: Whole  meds with liquid Supervision: Staff to assist with self feeding Compensations: Slow rate;Small sips/bites Postural Changes and/or Swallow Maneuvers: Seated upright 90 degrees;Upright 30-60 min after meal                  Oral care BID   Frequent or constant Supervision/Assistance Dysphagia, unspecified (R13.10)     All goals met     Gwynneth Aliment, M.A., CF-SLP Speech Language Pathology, Acute Rehabilitation Services  Secure Chat preferred [phone]   , 3:57 PM

## 2023-07-21 NOTE — Progress Notes (Addendum)
Contacted inpt HD to inquire how pt is tolerating HD in chair today. Staff advised navigator pt is doing well. Contacted local clinic to provide update that pt has tolerated HD in chair x2. Inquired if clinic would be able to medically clear and provide pt's schedule. Awaiting response from clinic. Will assist as needed.   Olivia Canter Renal Navigator [phone]  Addendum at 1:52 pm: Pt has been accepted at [facility]) on TTS 11:55 chair time. Pt can start on Thursday and will need to arrive at 11:00 to complete paperwork prior to treatment. Spoke to pt's daughter, Christella Scheuermann, via phone to go over arrangements and she is agreeable to plan. [name] also aware that a family member will need to meet pt at HD clinic on first day to assist with paperwork prior to treatment. [name] agreeable to assist with this matter. Update provided to attending, nephrologist, and CSW as well. Arrangements added to AVS. Reminded staff that snf will need to be advised to send a hoyer pad/sling under pt to HD so pt can be lifted from w/c to HD chair for HD.

## 2023-07-21 NOTE — Procedures (Signed)
Patient was seen on dialysis and the procedure was supervised.  BFR 300  Via AVG BP is  96/60.   Patient appears to be tolerating treatment well mostly -  says she does not feel well in general   Cecille Aver 

## 2023-07-21 NOTE — Plan of Care (Signed)
  Problem: Education: Goal: Knowledge of General Education information will improve Description: Including pain rating scale, medication(s)/side effects and non-pharmacologic comfort measures  2251 by Irwin Brakeman, RN Outcome: Progressing  1607 by Irwin Brakeman, RN Outcome: Progressing   Problem: Health Behavior/Discharge Planning: Goal: Ability to manage health-related needs will improve  2251 by Irwin Brakeman, RN Outcome: Progressing  1607 by Irwin Brakeman, RN Outcome: Progressing   Problem: Clinical Measurements: Goal: Ability to maintain clinical measurements within normal limits will improve  2251 by Irwin Brakeman, RN Outcome: Progressing  1607 by Irwin Brakeman, RN Outcome: Progressing Goal: Will remain free from infection  2251 by Irwin Brakeman, RN Outcome: Progressing  1607 by Irwin Brakeman, RN Outcome: Progressing Goal: Diagnostic test results will improve  2251 by Irwin Brakeman, RN Outcome: Progressing  1607 by Irwin Brakeman, RN Outcome: Progressing Goal: Respiratory complications will improve  2251 by Irwin Brakeman, RN Outcome: Progressing  1607 by Irwin Brakeman, RN Outcome: Progressing Goal: Cardiovascular complication will be avoided  2251 by Irwin Brakeman, RN Outcome: Progressing  1607 by Irwin Brakeman, RN Outcome: Progressing   Problem: Activity: Goal: Risk for activity intolerance will decrease  2251 by Irwin Brakeman, RN Outcome: Progressing  1607 by Irwin Brakeman, RN Outcome: Progressing   Problem: Nutrition: Goal: Adequate nutrition will be maintained  2251 by Irwin Brakeman, RN Outcome: Progressing  1607 by Irwin Brakeman, RN Outcome:  Progressing   Problem: Coping: Goal: Level of anxiety will decrease  2251 by Irwin Brakeman, RN Outcome: Progressing  1607 by Irwin Brakeman, RN Outcome: Progressing   Problem: Elimination: Goal: Will not experience complications related to bowel motility  2251 by Irwin Brakeman, RN Outcome: Progressing  1607 by Irwin Brakeman, RN Outcome: Progressing   Problem: Pain Managment: Goal: General experience of comfort will improve  2251 by Irwin Brakeman, RN Outcome: Progressing  1607 by Irwin Brakeman, RN Outcome: Progressing   Problem: Safety: Goal: Ability to remain free from injury will improve  2251 by Irwin Brakeman, RN Outcome: Progressing  1607 by Irwin Brakeman, RN Outcome: Progressing   Problem: Skin Integrity: Goal: Risk for impaired skin integrity will decrease  2251 by Irwin Brakeman, RN Outcome: Progressing  1607 by Irwin Brakeman, RN Outcome: Progressing

## 2023-07-22 ENCOUNTER — Inpatient Hospital Stay (HOSPITAL_COMMUNITY): Payer: Medicare (Managed Care)

## 2023-07-22 DIAGNOSIS — Z992 Dependence on renal dialysis: Secondary | ICD-10-CM | POA: Diagnosis not present

## 2023-07-22 DIAGNOSIS — N186 End stage renal disease: Secondary | ICD-10-CM | POA: Diagnosis not present

## 2023-07-22 DIAGNOSIS — D649 Anemia, unspecified: Secondary | ICD-10-CM | POA: Diagnosis not present

## 2023-07-22 LAB — BASIC METABOLIC PANEL
Anion gap: 18 — ABNORMAL HIGH (ref 5–15)
BUN: 36 mg/dL — ABNORMAL HIGH (ref 8–23)
CO2: 25 mmol/L (ref 22–32)
Calcium: 8.7 mg/dL — ABNORMAL LOW (ref 8.9–10.3)
Chloride: 92 mmol/L — ABNORMAL LOW (ref 98–111)
Creatinine, Ser: 3.8 mg/dL — ABNORMAL HIGH (ref 0.44–1.00)
GFR, Estimated: 12 mL/min — ABNORMAL LOW (ref >60)
Glucose, Bld: 113 mg/dL — ABNORMAL HIGH (ref 70–99)
Potassium: 4.5 mmol/L (ref 3.5–5.1)
Sodium: 135 mmol/L (ref 135–145)

## 2023-07-22 LAB — CBC
HCT: 26.4 % — ABNORMAL LOW (ref 36.0–46.0)
Hemoglobin: 8.3 g/dL — ABNORMAL LOW (ref 12.0–15.0)
MCH: 29 pg (ref 26.0–34.0)
MCHC: 31.4 g/dL (ref 30.0–36.0)
MCV: 92.3 fL (ref 80.0–100.0)
Platelets: 227 10*3/uL (ref 150–400)
RBC: 2.86 MIL/uL — ABNORMAL LOW (ref 3.87–5.11)
RDW: 16.3 % — ABNORMAL HIGH (ref 11.5–15.5)
WBC: 9.2 10*3/uL (ref 4.0–10.5)
nRBC: 0.4 % — ABNORMAL HIGH (ref 0.0–0.2)

## 2023-07-22 MED ORDER — SUCRALFATE 1 GM/10ML PO SUSP: 1.0000 g | Freq: Three times a day (TID) | ORAL | 0 refills | Status: DC

## 2023-07-22 MED ORDER — PANTOPRAZOLE SODIUM 40 MG PO TBEC: 40.0000 mg | DELAYED_RELEASE_TABLET | Freq: Two times a day (BID) | ORAL | 2 refills | Status: DC

## 2023-07-22 MED ORDER — MIDODRINE HCL 5 MG PO TABS
2.5000 mg | ORAL_TABLET | Freq: Once | ORAL | Status: AC
  Administered 2023-07-22: 2.5 mg via ORAL
  Filled 2023-07-22: qty 1

## 2023-07-22 MED ORDER — GUAIFENESIN ER 600 MG PO TB12
600.0000 mg | ORAL_TABLET | Freq: Two times a day (BID) | ORAL | Status: DC
  Administered 2023-07-22 – 2023-07-24 (×4): 600 mg via ORAL
  Filled 2023-07-22 (×5): qty 1

## 2023-07-22 MED ORDER — FERROUS SULFATE 325 (65 FE) MG PO TABS: ORAL_TABLET | ORAL | 11 refills | Status: DC

## 2023-07-22 MED ORDER — CHLORHEXIDINE GLUCONATE CLOTH 2 % EX PADS
6.0000 | MEDICATED_PAD | Freq: Every day | CUTANEOUS | Status: DC
  Administered 2023-07-22 – 2023-07-26 (×2): 6 via TOPICAL

## 2023-07-22 MED ORDER — GUAIFENESIN 100 MG/5ML PO LIQD
5.0000 mL | ORAL | Status: DC | PRN
  Administered 2023-07-22: 5 mL via ORAL
  Filled 2023-07-22: qty 15

## 2023-07-22 MED ORDER — MIDODRINE HCL 10 MG PO TABS: 10.0000 mg | ORAL_TABLET | Freq: Two times a day (BID) | ORAL | 3 refills | Status: DC

## 2023-07-22 NOTE — TOC Progression Note (Addendum)
Transition of Care [facility]) - Initial/Assessment Note    Patient Details  Name: [name] MRN: [id number] Date of Birth: 02-22-1946  Transition [facility] Care [facility]) CM/SW Contact:    Ralene Bathe, LCSW Phone Number: 07/22/2023, 9:57 AM  Clinical Narrative:                 LCSW was contacted by [facility] (S[facility]) and [facility] social worker, Marylu Lund [phone]).  Both informed LCSW that everything has been arranged for the patient to discharge to the facility today if medically ready.  [facility] will transport.  MD informed of the above.  Addendum 11:00-  LCSW informed by floor RN that the discharge has been cancelled.  LCSW informed admissions at [facility] and [facility] Child psychotherapist.  TOC following.         Patient Goals and CMS Choice            Expected Discharge Plan and Services                                              Prior Living Arrangements/Services                       Activities of Daily Living Home Assistive Devices/Equipment: None ADL Screening (condition at time of admission) Patient's cognitive ability adequate to safely complete daily activities?: Yes Is the patient deaf or have difficulty hearing?: No Does the patient have difficulty seeing, even when wearing glasses/contacts?: No Does the patient have difficulty concentrating, remembering, or making decisions?: Yes Patient able to express need for assistance with ADLs?: Yes Does the patient have difficulty dressing or bathing?: Yes Independently performs ADLs?: No Does the patient have difficulty walking or climbing stairs?: Yes Weakness of Legs: Both Weakness of Arms/Hands: None  Permission Sought/Granted                  Emotional Assessment              Admission diagnosis:  Hyperkalemia [E87.5] Acute on chronic anemia [D64.9] Patient Active Problem List   Diagnosis Date Noted   Hyponatremia 07/20/2023   Failure to thrive in adult 07/12/2023    Protein-calorie malnutrition, severe 07/10/2023   Discharge planning issues 07/10/2023   AVM (arteriovenous malformation) of duodenum, acquired with hemorrhage 07/09/2023   AVM (arteriovenous malformation) of stomach, acquired with hemorrhage 07/09/2023   Melena 07/08/2023   ESRD on dialysis (HCC) 07/08/2023   Acute encephalopathy 07/08/2023   Gastrointestinal hemorrhage 07/08/2023   Acute on chronic anemia 07/07/2023   AKI (acute kidney injury) (HCC) 04/23/2020   Cough with hemoptysis 08/02/2019   SOB (shortness of breath)    Atypical chest pain    Chest pain on exertion 05/15/2019   Cerebral embolism with cerebral infarction 05/09/2019   Expressive aphasia 05/08/2019   Other abnormal glucose 11/26/2018   Polypharmacy 06/25/2018   Depression with anxiety    Tracheostomy present (HCC)    Tracheostomy care (HCC)    Calculus of gallbladder without cholecystitis without obstruction 12/24/2017   Chronic cough 09/25/2017   History of colonic polyps    Polyp of transverse colon    Polyp of sigmoid colon    Moderate protein-calorie malnutrition (HCC) 06/02/2017   Chronic idiopathic constipation 03/19/2017   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   Permanent atrial fibrillation (HCC)    Laryngeal stenosis 09/18/2016  Esophageal dysphagia    Polymyalgia rheumatica (HCC) 04/10/2015   Hypertrophic cardiomyopathy (HCC) 02/15/2015   Eczema 09/18/2014   Environmental allergies 09/18/2014   Pre-diabetes 11/26/2013   COPD, moderate (HCC) 11/01/2013   Complete heart block (HCC) 04/03/2011   Acute on chronic diastolic heart failure (HCC) 02/20/2011   Anemia 02/17/2011   AVM (arteriovenous malformation) of small bowel, acquired 02/13/2011   Current use of long term anticoagulation 01/15/2011   PACEMAKER-St.Jude 11/28/2010   CKD (chronic kidney disease) stage 4, GFR 15-29 ml/min (HCC) 09/24/2010   Acute on chronic congestive heart failure (HCC) 08/30/2010   ARTHRITIS 07/24/2010   Generalized  anxiety disorder 07/23/2010   Hyperlipidemia 02/11/2007   Essential hypertension 02/11/2007   APNEA, SLEEP 02/11/2007   PCP:  Inc, [facility] Pharmacy:   [facility] - [place], [place] - [address] [place] [address] Phone: [phone] Fax: [phone]     Social Determinants of Health (SDOH) Social History: SDOH Screenings   Food Insecurity: No Food Insecurity (07/13/2023)  Housing: Low Risk  (07/13/2023)  Transportation Needs: No Transportation Needs (07/13/2023)  Utilities: Not At Risk (07/13/2023)  Depression (PHQ2-9): Low Risk  (05/20/2019)  Tobacco Use: Medium Risk (07/09/2023)   SDOH Interventions: Transportation Interventions: Intervention Not Indicated, Inpatient TOC, Patient Resources (Friends/Family)   Readmission Risk Interventions    07/08/2023    3:26 PM  Readmission Risk Prevention Plan  Transportation Screening Complete  PCP or Specialist Appt within 3-5 Days Complete  HRI or Home Care Consult Complete  Social Work Consult for Recovery Care Planning/Counseling Complete  Palliative Care Screening Not Applicable  Medication Review Oceanographer) Referral to Pharmacy

## 2023-07-22 NOTE — Progress Notes (Signed)
Pt c/o difficulty breathing this am. O2 sats obtained. L/S congested. Respiratory notified and to bedside to assess pt. NTS attempted by RT. Will notify MD and continue to monitor.

## 2023-07-22 NOTE — Progress Notes (Signed)
[name] Progress Note   Assessment/ Plan:   [name] is a/an 77 y.o. female with a past medical history significant for CKD V, CHF, GI bleeding/AVMs, DM2, afib, dementia, frailty, admitted for GI bleeding and ARF.          AKI on CKD V --> ESRD.  Family wants to pursue dialysis             - Continue HD on TTS schedule- next due on 8/8             -  ESRD CLIP-  has been accepted at [facility] tts second- can start as early as 8/8               - continue with AVG, working well             -  midodrine 10mg  with HD-  inc to bid every day             - pal care involved             - needs to sit in chair for HD- did so on Sat and Tuesday   2.  Hyperkalemia: resolved   3.  Anemia:             - suspect GI bleed on top of anemia of renal failure             - s/p push enteroscopy             - transfusions 8/2 2 u pRBCs- iron lower earler in hosp-  will give iron                - Start esa on Tuesday-   will inc dose- fairly stable 8/4-  8/6-  inc on 8/7   4.  BMM             - PTH 425, add vitamin D- phos at goal             - ca corrects to normal when accounting for albumin   5.  Dementia:             - on Aricept             - pal care involved     6. Nutrition: albumin 2. Push protein  7. Dispo-  given issue of SOB today this may delay discharge-  getting CXR and breathing tx   Subjective:    HD yest-  only removed 900 ccs due to hypotension.  Plan was for her to be discharged to [facility] today but is c/o SOB-  difficult to console-     Objective:   BP (!) 114/42 (BP Location: Right Arm)   Pulse 69   Temp 98.1 F (36.7 C)   Resp 20   Ht 5\' 5"  (1.651 m)   Wt 48.1 kg   SpO2 95%   BMI 17.65 kg/m   Physical Exam: NWG:NFAOZ in bed, faster resp rate-  upper airway congestion-  pulse ox 100 on supp O2 CVS: RRR Resp: clear, trach site bandaged Abd: soft Ext: no LE edema ACCESS: AVG- patent  Labs: BMET Recent Labs  Lab 07/16/23 0256  07/17/23 0903 07/18/23 0643 07/19/23 0206 07/20/23 0406 08/11/2023 0840 07/22/23 0846  NA 129* 134* 134* 135 131* 133* 135  K 3.7 4.2 4.3 3.7 4.1 4.4 4.5  CL 92* 94* 95* 96* 92* 93* 92*  CO2 25 26 24 25  26  26 25  GLUCOSE 112* 109* 121* 89 95 119* 113*  BUN 36* 32* 61* 27* 48* 72* 36*  CREATININE 4.39* 3.37* 4.42* 2.62* 4.18* 5.50* 3.80*  CALCIUM 8.0* 8.1* 8.1* 8.1* 7.9* 8.2* 8.7*  PHOS  --   --  2.5  --   --   --   --    CBC Recent Labs  Lab 07/18/23 1210 07/19/23 0206 07/19/23 1432 07/20/23 0406 07/23/2023 0840 07/22/23 0846  WBC 14.1* 9.8  --  8.5 8.6 9.2  NEUTROABS 11.8* 8.1*  --   --   --   --   HGB 9.3* 8.3* 8.0* 7.9* 7.8* 8.3*  HCT 27.8* 26.6* 25.5* 25.2* 24.5* 26.4*  MCV 88.0 88.7  --  90.0 92.1 92.3  PLT 178 181  --  189 204 227      Medications:     sodium chloride   Intravenous Once   sodium chloride   Intravenous Once   calcitRIOL  0.25 mcg Oral Q T,Th,Sa-HD   Chlorhexidine Gluconate Cloth  6 each Topical Q0600   darbepoetin (ARANESP) injection - DIALYSIS  60 mcg Subcutaneous Q Tue-1800   donepezil  10 mg Oral QHS   feeding supplement  237 mL Oral BID BM   lidocaine  1 patch Transdermal Q24H   midodrine  10 mg Oral BID WC   mirtazapine  7.5 mg Oral QHS   pantoprazole  40 mg Oral BID   rosuvastatin  40 mg Oral QHS   sucralfate  1 g Oral TID AC & HS    Laray Corbit A Anysha Frappier  07/22/2023, 10:43 AM

## 2023-07-22 NOTE — Plan of Care (Addendum)
[facility]  Initial Hemodialysis Orders  Dialysis center: [facility]  Patient's name: [name] DOB: 06-Jan-1946 AKI or ESRD: ESRD  Discharge diagnosis: AKI on CKD V now progressed to ESRD (Patient of Dr. Ronalee Belts at [facility]) 2. GIB - s/p push enteroscopy, s/p 2 units PRBCs, s/p Ferric gluconate 250mg  given on 8/6 and 8/7.  Past Medical History: CKD V, CHF, GI bleeding/AVMs, DM2, afib (s/p PPM), dementia, frailty, admitted for GI bleeding and ARF.   Allergies:  Allergies  Allergen Reactions   Ace Inhibitors Swelling    angiodema   Other Anaphylaxis and Swelling    Reaction to unspecified anesthesia    Propofol     Dysrhythmia likely to drop in afterload post induction with Propofol. Did not have anaphylactic symptoms with MAC in 2018    Date of First Dialysis: 07/07/23 Cause of renal disease: Has multiple co-morbidities with 2 week history of FTT, uremia, and GI bleed in the setting of advanced CKD   Dialysis Prescription: Dialysis Frequency: TTS Tx duration: 3.5 to 4hrs BFR: 350 to 400 DFR: 500 to 600 EDW: 48.5kg  Dialyzer: 180NRe UF profile/Sodium modeling?: None Dialysis Bath: 2 K 2.5 Ca  Dialysis access: Access type: AVG Date placed: 06/02/23 Surgeon: Dr. Edilia Bo Needle gauge: 15  In Center Medications: Heparin Dose: No Heparin for now given hx GIB/AVMs  VDRA: Resume Calcitriol at 0.68mcg with HD Venofer: Per protocol. S/p 2 units PRBCs on 8/2. Received Ferric gluconate 250mg  on 8/6 and 8/7 Last Aranesp given 8/6. Transition to Mircera: every 2 weeks  Patient is on Midodrine 10mg  BID Patient tolerated sitting in chair last Sat and this Tuesday  Discharge labs: Hgb: 8.3  K+: 4.5 Ca: 8.7 Phos: 2.5 from 8/3  Alb: 2.0 from 8/3  Please draw routine labs. Additional labs needed: Follow facility protocol on lab draw

## 2023-07-22 NOTE — Progress Notes (Signed)
Physical Therapy Treatment Patient Details Name: [name] MRN: [id number] DOB: February 14, 1946 Today's Date: 07/22/2023   History of Present Illness 77 y.o. female presents to [facility] hospital on 06/28/2023 with symptomatic anemia and FTT. Small bowel enteroscopy on 2023/08/06. Tracheostomy decannulated 8/1. PMH includes afib, ESRD, COPD, CVA, CHF, laryngeal stenosis s/p tracheostomy 2017, chronic GIB, dementia.    PT Comments  Pt received in supine, agreeable to therapy session with encouragement, pt with increased fatigue and difficulty following simple commands for bed mobility/transfers this date, needing increased assist maxA for bed mobility and stand pivot transfer to chair via face to face transfer. Hoyer pad ordered for room so nursing staff can assist her back to bed from chair later in the day. Pt continues to benefit from PT services to progress toward functional mobility goals.     If plan is discharge home, recommend the following: A lot of help with bathing/dressing/bathroom;Assistance with cooking/housework;Assist for transportation;Help with stairs or ramp for entrance;Direct supervision/assist for medications management;Direct supervision/assist for financial management;Two people to help with walking and/or transfers   Can travel by private vehicle        Equipment Recommendations  Other (comment) (hoyer/wheelchair level)    Recommendations for Other Services       Precautions / Restrictions Precautions Precautions: Fall Precaution Comments: Trach decannulated 8/1, watch BP Restrictions Weight Bearing Restrictions: No     Mobility  Bed Mobility Overal bed mobility: Needs Assistance Bed Mobility: Rolling, Sidelying to Sit Rolling: Mod assist Sidelying to sit: Max assist, +2 for safety/equipment, HOB elevated       General bed mobility comments: limited carryover of cues for UE placement, maxA for trunk raising, pt able to assist with dense cues for LE placement     Transfers Overall transfer level: Needs assistance Equipment used: None Transfers: Sit to/from Stand, Bed to chair/wheelchair/BSC Sit to Stand: Max assist, +2 safety/equipment Stand pivot transfers: Max assist, +2 safety/equipment         General transfer comment: face to face stand pivot transfer to chair    Ambulation/Gait                   Stairs             Wheelchair Mobility     Tilt Bed    Modified Rankin (Stroke Patients Only)       Balance Overall balance assessment: Needs assistance Sitting-balance support: Feet supported, Single extremity supported Sitting balance-Leahy Scale: Poor Sitting balance - Comments: needs constant physical assist sitting EOB Postural control: Posterior lean Standing balance support: Bilateral upper extremity supported, During functional activity Standing balance-Leahy Scale: Zero Standing balance comment: reliant on therapist for support                            Cognition Arousal: Alert Behavior During Therapy: WFL for tasks assessed/performed Overall Cognitive Status: History of cognitive impairments - at baseline                                 General Comments: difficulty expressing needs, asking to get OOB to chair        Exercises      General Comments General comments (skin integrity, edema, etc.): BP 123/58 (70) supine, SpO2 93% on RA HR 70 sitting EOB; BP 135/117 (124) in recliner      Pertinent Vitals/Pain Pain Assessment Pain Assessment: Faces  Faces Pain Scale: Hurts little more Pain Location: not localized Pain Descriptors / Indicators: Grimacing, Guarding, Discomfort Pain Intervention(s): Limited activity within patient's tolerance, Monitored during session, Repositioned    Home Living                          Prior Function            PT Goals (current goals can now be found in the care plan section) Acute Rehab PT Goals Patient Stated  Goal: to return to ambulation per daughter PT Goal Formulation: With patient Time For Goal Achievement: 07/24/23 Progress towards PT goals: Progressing toward goals (slowly)    Frequency    Min 3X/week      PT Plan Current plan remains appropriate    Co-evaluation              AM-PAC PT "6 Clicks" Mobility   Outcome Measure  Help needed turning from your back to your side while in a flat bed without using bedrails?: A Lot Help needed moving from lying on your back to sitting on the side of a flat bed without using bedrails?: Total Help needed moving to and from a bed to a chair (including a wheelchair)?: Total Help needed standing up from a chair using your arms (e.g., wheelchair or bedside chair)?: Total Help needed to walk in hospital room?: Total Help needed climbing 3-5 steps with a railing? : Total 6 Click Score: 7    End of Session Equipment Utilized During Treatment: Gait belt Activity Tolerance: Patient tolerated treatment well;Patient limited by fatigue Patient left: in chair;with call bell/phone within reach;with family/visitor present (daughter will remain with her while she is up in chair) Nurse Communication: Mobility status;Other (comment) Training and development officer ordered a hoyer lift pad for her room, has not arrived by end of session, use pad to get her back to bed if too fatigued for [employer]) PT Visit Diagnosis: Other abnormalities of gait and mobility (R26.89);Muscle weakness (generalized) (M62.81)     Time: 3664-4034 PT Time Calculation (min) (ACUTE ONLY): 29 min  Charges:    $Therapeutic Activity: 23-37 mins PT General Charges $$ ACUTE PT VISIT: 1 Visit                     Brennen Gardiner P., PTA Acute Rehabilitation Services Secure Chat Preferred 9a-5:30pm Office: [phone]    Dorathy Kinsman [facility] 07/22/2023, 8:00 PM

## 2023-07-22 NOTE — Significant Event (Signed)
Rapid Response Event Note   Reason for Call :  SOB  Initial Focused Assessment:  On arrival, pt sitting up in bed A&O, c/o SOB.  Vitals: HR 69, BP 114/42, RR 22, spO2 94% on 2L [place].  Rhonchi heard bilaterally throughout. Pt with weak congested cough.    Interventions:  NTS done by RT PTA CXR Continuous pulse ox Mucinex ordered  Plan of Care:  Continue to monitor pt respiratory status. RN to call with any changes or concerns.    Event Summary:   MD Notified: Internal Medicine team notified and to bedside Call Time: 1020 Arrival Time: 1025 End Time: 1058  Mordecai Rasmussen, RN

## 2023-07-22 NOTE — Progress Notes (Signed)
Previous shift RN reported and I confirmed Stage 2 wound on sacrum. MD on called notified.

## 2023-07-22 NOTE — Progress Notes (Signed)
Subjective: [name] is a 77 year-old female who has a PMHx of of complete heart block s/p Abbott PPM in 1986, atrial fibrillation, HTN, aortic atherosclerosis, laryngeal stenosis s/p tracheostomy, CVA 04/2019, GI bleed 2012, AVMs, HCM/chronic congestive heart failure, ESRD s/p fistula, COPD who presented with decreased PO intake and melena found to have acute on chronic anemia and GI bleed   Initial plans for discharge this afternoon but rapid response was called for reported difficulty with breathing. She had a congested cough and rhonchi reported on exam. She had normal sats on 2L nasal cannula.   Objective: Vital signs in last 24 hours: Vitals:    2052 07/22/23 0525 07/22/23 0811 07/22/23 0838  BP: (!) 91/49 (!) 94/49  (!) 114/42  Pulse: 70 70 70 69  Resp: 17 17  20   Temp: 98.2 F (36.8 C) 98.3 F (36.8 C)  98.1 F (36.7 C)  TempSrc:      SpO2: 97% (!) 89% 93% 95%  Weight:      Height:       Weight change:   Intake/Output Summary (Last 24 hours) at 07/22/2023 1248 Last data filed at 07/22/2023 0800 Gross per 24 hour  Intake 170.41 ml  Output 0 ml  Net 170.41 ml   Physical Exam: General appearance: frail, alert, awake, mild distress  Eyes: conjunctivae/corneas clear. PERRL, EOM's intact Neck:  tracheostomy valve removed, dressing in place  Lungs: rhonchi present bilaterally, normal WOB, no wheezing   Heart: RRR, S1/S2 normal, systolic murmur at RUSB  Abdomen: soft, NTND Extremities: LUE AV graft fistula in place Neurologic: alert and oriented x3  Lab Results:    Latest Ref Rng & Units 07/22/2023    8:46 AM     8:40 AM 07/20/2023    4:06 AM  CBC  WBC 4.0 - 10.5 K/uL 9.2  8.6  8.5   Hemoglobin 12.0 - 15.0 g/dL 8.3  7.8  7.9   Hematocrit 36.0 - 46.0 % 26.4  24.5  25.2   Platelets 150 - 400 K/uL 227  204  189       Latest Ref Rng & Units 07/22/2023    8:46 AM     8:40 AM 07/20/2023    4:06 AM  CMP  Glucose 70 - 99 mg/dL 540  981  95    BUN 8 - 23 mg/dL 36  72  48   Creatinine 0.44 - 1.00 mg/dL 1.91  4.78  2.95   Sodium 135 - 145 mmol/L 135  133  131   Potassium 3.5 - 5.1 mmol/L 4.5  4.4  4.1   Chloride 98 - 111 mmol/L 92  93  92   CO2 22 - 32 mmol/L 25  26  26    Calcium 8.9 - 10.3 mg/dL 8.7  8.2  7.9    No results for input(s): "GLUCAP" in the last 72 hours.  Micro Results: No results found for this or any previous visit (from the past 240 hour(s)).  Studies/Results: DG Chest Port 1 View  Result Date: 07/22/2023 CLINICAL DATA:  62130 Acute respiratory distress 66502 EXAM: PORTABLE CHEST 1 VIEW COMPARISON:  07/07/2023 FINDINGS: Previously seen tracheostomy tube has been removed. Right-sided implanted cardiac device remains in place. Stable cardiomegaly. Aortic atherosclerosis. Slightly increased interstitial markings bilaterally. No lobar consolidation. No pleural effusion or pneumothorax. IMPRESSION: Slightly increased interstitial markings bilaterally, which may reflect mild pulmonary edema. Electronically Signed   By: Duanne Guess D.O.   On: 07/22/2023 11:55  Medications: I have reviewed the patient's current medications. Scheduled Meds:  sodium chloride   Intravenous Once   sodium chloride   Intravenous Once   calcitRIOL  0.25 mcg Oral Q T,Th,Sa-HD   Chlorhexidine Gluconate Cloth  6 each Topical Q0600   Chlorhexidine Gluconate Cloth  6 each Topical Q0600   darbepoetin (ARANESP) injection - DIALYSIS  60 mcg Subcutaneous Q Tue-1800   donepezil  10 mg Oral QHS   feeding supplement  237 mL Oral BID BM   guaiFENesin  600 mg Oral BID   lidocaine  1 patch Transdermal Q24H   midodrine  10 mg Oral BID WC   mirtazapine  7.5 mg Oral QHS   pantoprazole  40 mg Oral BID   rosuvastatin  40 mg Oral QHS   sucralfate  1 g Oral TID AC & HS   Continuous Infusions:  ferric gluconate (FERRLECIT) IVPB 135 mL/hr at  1459   PRN Meds:.acetaminophen **OR** acetaminophen, alum & mag hydroxide-simeth, guaiFENesin,  senna-docusate  Assessment/Plan: Principal Problem:   Acute on chronic anemia Active Problems:   Current use of long term anticoagulation   Tracheostomy present (HCC)   Melena   ESRD on dialysis (HCC)   Acute encephalopathy   Gastrointestinal hemorrhage   AVM (arteriovenous malformation) of duodenum, acquired with hemorrhage   AVM (arteriovenous malformation) of stomach, acquired with hemorrhage   Protein-calorie malnutrition, severe   Discharge planning issues   Failure to thrive in adult  [name] is a 77 year-old female who has a PMHx of of complete heart block s/p Abbott PPM in 1986, atrial fibrillation, HTN, aortic atherosclerosis, laryngeal stenosis s/p tracheostomy, CVA 04/2019, GI bleed 2012, AVMs, HCM/chronic congestive heart failure, ESRD s/p fistula, COPD who presented with decreased PO intake and melena found to have acute on chronic anemia and GI bleed   Active Problems   #Trach collar (removed)  H/o laryngeal stenosis, stable  #Shortness of breath #Pulmonary edema  Decannulated tracheostomy tube on 8/1. Rapid response 8/7 AM for c/o SOB. Rhonchi present on exam with congestion. On 2L nasal cannula with normal sats. CXR showed slightly increased interstitial markings bilaterally c/f mild pulmonary edema. No consolidation or pleural effusion found. HDS. SOB likely 2/2 accumulated congestion.    -Robitussin 100mg /39ml liquid   -Flutter valve  -Continuous pulse ox, monitor vitals  -Wean O2 as tolerated, on 2L nasal cannula  -ENT following, appreciate recs  -SLP following   #Angiodysplastic lesions s/p enteroscopy  UGIB  H/o small bowel AVMs #Acute on chronic anemia, stable   Enteroscopy on 7/25 showed multiple angiodysplastic lesions treated with APC. Received ESA agent on 7/30 and 8/6. Hb up to 8.3    -Cont PO protonix 40mg  BID  -Cont Carafate TID for 14d course, started on 7/26  -Restart Eliquis 2.5mg  BID until 8/7  -PT/OT  -If Hb falls below 7,  consider 1u pRBC transfusion   #Poor PO intake Monitor intake  -Dietician following -Feeding supplement  -Monitor daily weights  -Consider involving palliative care for GOC if worsening    #ESRD  AV graft fistula #Hypotension  Hemodialysis schedule T/Th/Sat. Tolerated dialysis in chair x2. Approved for outpatient clinic approval. Noted to be hypotensive during dialysis. Following dialysis, pressures continued to be low requiring another dose of midodrine    -Received first dose of 2-dose series HBV vaccine (7/29). Plan for 2nd dose in 26-month outpatient  -Midodrine 10mg  on dialysis days  -Daily BMP    #Dementia  Reported onset of dementia in  2021. -Continue home donepezil 10mg  daily    Chronic Problems   #Atrial fibrillation, controlled #HLD, controlled  #H/o stroke Reported stroke on 04/2019 with residual expressive aphasia. Last lipid panel showed LDL 55 (05/29/23) -Cont home rosuvastatin 40mg   -Restart Eliquis 2.5mg  BID    #Chronic CHF  HCM  #Complete heart block s/p PPM (1986)  Follows with HF clinic.   -Hold metoprolol succinate 12.5mg  daily iso hypotension  -May consider home diuretic on non-dialysis days based on volume status      #HTN, controlled -Continue to monitor  This is a Psychologist, occupational Note.  The care of the patient was discussed with Dr. Thomasene Ripple  and the assessment and plan formulated with their assistance.  Please see their attached note for official documentation of the daily encounter.   LOS: 15 days   Loyal Buba 07/22/2023, 12:48 PM

## 2023-07-22 NOTE — Progress Notes (Signed)
Subjective: Called by respiratory therapist due to her having more difficulty breathing and clearing secretions.  Currently, she is comfortable with no difficulty breathing and no stridor.  Her daughter is present.  Objective: Vital signs in last 24 hours: Temp:  [97.3 F (36.3 C)-98.3 F (36.8 C)] 98.1 F (36.7 C) (08/07 0838) Pulse Rate:  [69-82] 69 (08/07 0838) Resp:  [16-21] 20 (08/07 0838) BP: (90-114)/(42-57) 114/42 (08/07 0838) SpO2:  [89 %-100 %] 95 % (08/07 0838) Wt Readings from Last 1 Encounters:  06/25/23 50.3 kg    Intake/Output from previous day: 08/06 0701 - 08/07 0700 In: 50.4 [P.O.:50; IV Piggyback:0.4] Out: 900  Intake/Output this shift: No intake/output data recorded.  General appearance: alert, cooperative, and no distress Neck: trach site with small opening still  Recent Labs     0840 07/22/23 0846  WBC 8.6 9.2  HGB 7.8* 8.3*  HCT 24.5* 26.4*  PLT 204 227    Recent Labs     0840 07/22/23 0846  NA 133* 135  K 4.4 4.5  CL 93* 92*  CO2 26 25  GLUCOSE 119* 113*  BUN 72* 36*  CREATININE 5.50* 3.80*  CALCIUM 8.2* 8.7*    Medications: I have reviewed the patient's current medications.  Assessment/Plan: Laryngeal stenosis s/p decannulation of tracheostomy  She looks good presently.  As I told the respiratory therapist, if she develops the need for intubation due to respiratory distress, that should be performed as usual.  Consideration for replacement of the tracheostomy would follow.  However, in discussing with her daughter, she still has several reasons why her care would be easier if she did not need a tracheostomy tube.  So, I do not recommend any changes at this point in time.  Hopefully, the tracheostomy site will fully close with time.  If not, surgical closure would be needed.   LOS: 15 days   Christia Reading 07/22/2023, 11:03 AM

## 2023-07-22 NOTE — Progress Notes (Signed)
Out-pt HD clinic advised that pt will not be d/c today and will not start at clinic tomorrow. Have inquired of clinic if pt could start on Saturday if pt stable for d/c Thursday or Friday. Will await response. Will assist as needed.   Olivia Canter Renal Navigator [phone]

## 2023-07-23 DIAGNOSIS — N186 End stage renal disease: Secondary | ICD-10-CM | POA: Diagnosis not present

## 2023-07-23 DIAGNOSIS — D649 Anemia, unspecified: Secondary | ICD-10-CM | POA: Diagnosis not present

## 2023-07-23 DIAGNOSIS — R627 Adult failure to thrive: Secondary | ICD-10-CM | POA: Diagnosis not present

## 2023-07-23 DIAGNOSIS — E43 Unspecified severe protein-calorie malnutrition: Secondary | ICD-10-CM | POA: Diagnosis not present

## 2023-07-23 LAB — BASIC METABOLIC PANEL
Anion gap: 14 (ref 5–15)
BUN: 55 mg/dL — ABNORMAL HIGH (ref 8–23)
CO2: 24 mmol/L (ref 22–32)
Calcium: 8.7 mg/dL — ABNORMAL LOW (ref 8.9–10.3)
Chloride: 98 mmol/L (ref 98–111)
Creatinine, Ser: 5.45 mg/dL — ABNORMAL HIGH (ref 0.44–1.00)
GFR, Estimated: 7 mL/min — ABNORMAL LOW (ref >60)
Glucose, Bld: 97 mg/dL (ref 70–99)
Potassium: 5.3 mmol/L — ABNORMAL HIGH (ref 3.5–5.1)
Sodium: 136 mmol/L (ref 135–145)

## 2023-07-23 LAB — CBC
HCT: 26.2 % — ABNORMAL LOW (ref 36.0–46.0)
Hemoglobin: 8.2 g/dL — ABNORMAL LOW (ref 12.0–15.0)
MCH: 28.1 pg (ref 26.0–34.0)
MCHC: 31.3 g/dL (ref 30.0–36.0)
MCV: 89.7 fL (ref 80.0–100.0)
Platelets: 236 10*3/uL (ref 150–400)
RBC: 2.92 MIL/uL — ABNORMAL LOW (ref 3.87–5.11)
RDW: 16.4 % — ABNORMAL HIGH (ref 11.5–15.5)
WBC: 11.3 10*3/uL — ABNORMAL HIGH (ref 4.0–10.5)
nRBC: 1 % — ABNORMAL HIGH (ref 0.0–0.2)

## 2023-07-23 MED ORDER — APIXABAN 2.5 MG PO TABS
2.5000 mg | ORAL_TABLET | Freq: Two times a day (BID) | ORAL | Status: DC
  Administered 2023-07-23 – 2023-07-24 (×3): 2.5 mg via ORAL
  Filled 2023-07-23 (×3): qty 1

## 2023-07-23 NOTE — Progress Notes (Signed)
Subjective: [name] is a 77 year-old female who has a PMHx of of complete heart block s/p Abbott PPM in 1986, atrial fibrillation, HTN, aortic atherosclerosis, laryngeal stenosis s/p tracheostomy, CVA 04/2019, GI bleed 2012, AVMs, HCM/chronic congestive heart failure, ESRD s/p fistula, COPD who presented with decreased PO intake and melena found to have acute on chronic anemia and GI bleed   Improved SOB and decreased congestion. Continues to have poor intake and endorse lower extremity weakness. Going to go for dialysis today   Objective: Vital signs in last 24 hours: Vitals:   07/23/23 0400 07/23/23 0600 07/23/23 0830 07/23/23 0834  BP: (!) 152/119  (!) 93/53   Pulse: 70  71   Resp:   20   Temp: 98.3 F (36.8 C)  (!) 97.3 F (36.3 C)   TempSrc: Axillary  Oral   SpO2: 96%  95% 93%  Weight:  48.7 kg    Height:       Weight change:   Intake/Output Summary (Last 24 hours) at 07/23/2023 1055 Last data filed at 07/23/2023 0800 Gross per 24 hour  Intake 240 ml  Output --  Net 240 ml   Physical Exam: General appearance: frail, alert, awake, mild distress  Eyes: conjunctivae/corneas clear. PERRL, EOM's intact Neck:  tracheostomy valve removed, dressing in place  Lungs: CTAB, normal WOB, no rales or crackles Heart: RRR, S1/S2 normal, systolic murmur at RUSB  Abdomen: soft, NTND Extremities: LUE AV graft fistula in place Neurologic: alert and oriented x3  Lab Results:    Latest Ref Rng & Units 07/22/2023    8:46 AM     8:40 AM 07/20/2023    4:06 AM  CBC  WBC 4.0 - 10.5 K/uL 9.2  8.6  8.5   Hemoglobin 12.0 - 15.0 g/dL 8.3  7.8  7.9   Hematocrit 36.0 - 46.0 % 26.4  24.5  25.2   Platelets 150 - 400 K/uL 227  204  189       Latest Ref Rng & Units 07/22/2023    8:46 AM     8:40 AM 07/20/2023    4:06 AM  CMP  Glucose 70 - 99 mg/dL 161  096  95   BUN 8 - 23 mg/dL 36  72  48   Creatinine 0.44 - 1.00 mg/dL 0.45  4.09  8.11   Sodium 135 - 145 mmol/L 135  133   131   Potassium 3.5 - 5.1 mmol/L 4.5  4.4  4.1   Chloride 98 - 111 mmol/L 92  93  92   CO2 22 - 32 mmol/L 25  26  26    Calcium 8.9 - 10.3 mg/dL 8.7  8.2  7.9    No results for input(s): "GLUCAP" in the last 72 hours.  Micro Results: No results found for this or any previous visit (from the past 240 hour(s)).  Studies/Results: DG Chest Port 1 View  Result Date: 07/22/2023 CLINICAL DATA:  91478 Acute respiratory distress 66502 EXAM: PORTABLE CHEST 1 VIEW COMPARISON:  07/07/2023 FINDINGS: Previously seen tracheostomy tube has been removed. Right-sided implanted cardiac device remains in place. Stable cardiomegaly. Aortic atherosclerosis. Slightly increased interstitial markings bilaterally. No lobar consolidation. No pleural effusion or pneumothorax. IMPRESSION: Slightly increased interstitial markings bilaterally, which may reflect mild pulmonary edema. Electronically Signed   By: Duanne Guess D.O.   On: 07/22/2023 11:55    Medications: I have reviewed the patient's current medications. Scheduled Meds:  sodium chloride   Intravenous Once  sodium chloride   Intravenous Once   apixaban  2.5 mg Oral BID   calcitRIOL  0.25 mcg Oral Q T,Th,Sa-HD   Chlorhexidine Gluconate Cloth  6 each Topical Q0600   Chlorhexidine Gluconate Cloth  6 each Topical Q0600   darbepoetin (ARANESP) injection - DIALYSIS  60 mcg Subcutaneous Q Tue-1800   donepezil  10 mg Oral QHS   feeding supplement  237 mL Oral BID BM   guaiFENesin  600 mg Oral BID   lidocaine  1 patch Transdermal Q24H   midodrine  10 mg Oral BID WC   mirtazapine  7.5 mg Oral QHS   pantoprazole  40 mg Oral BID   rosuvastatin  40 mg Oral QHS   sucralfate  1 g Oral TID AC & HS   Continuous Infusions:  ferric gluconate (FERRLECIT) IVPB 250 mg (07/23/23 0917)   PRN Meds:.acetaminophen **OR** acetaminophen, alum & mag hydroxide-simeth, guaiFENesin, senna-docusate  Assessment/Plan: Principal Problem:   Acute on chronic anemia Active  Problems:   Current use of long term anticoagulation   Respiratory distress   Tracheostomy present (HCC)   Melena   ESRD on dialysis (HCC)   Acute encephalopathy   Gastrointestinal hemorrhage   AVM (arteriovenous malformation) of duodenum, acquired with hemorrhage   AVM (arteriovenous malformation) of stomach, acquired with hemorrhage   Protein-calorie malnutrition, severe   Discharge planning issues   Failure to thrive in adult   Pressure injury of coccygeal region, stage 2 (HCC)  [name] is a 77 year-old female who has a PMHx of of complete heart block s/p Abbott PPM in 1986, atrial fibrillation, HTN, aortic atherosclerosis, laryngeal stenosis s/p tracheostomy, CVA 04/2019, GI bleed 2012, AVMs, HCM/chronic congestive heart failure, ESRD s/p fistula, COPD who presented with decreased PO intake and melena found to have acute on chronic anemia and GI bleed   Active Problems   #ESRD  AV graft fistula #Hypotension, controlled   Hemodialysis schedule T/Th/Sat. Tolerated dialysis in chair x2. Approved for outpatient clinic approval. Noted to be hypotensive during dialysis. Proceeding with dialysis today   -Dialysis -Received first dose of 2-dose series HBV vaccine (7/29). Plan for 2nd dose in 46-month outpatient  -Started on midodrine 10mg  BID for soft pressures  -Daily BMP   #Angiodysplastic lesions s/p enteroscopy  UGIB  H/o small bowel AVMs #Acute on chronic anemia, stable   Enteroscopy on 7/25 showed multiple angiodysplastic lesions treated with APC. Received ESA agent on 7/30 and 8/6. Iron repletion   -Cont PO protonix 40mg  BID  -Cont Carafate TID for 14d course, started on 7/26  -ESA SQ every Tuesday  -Resumed Eliquis 2.5mg  BID  -IV ferric gluconate 250mg  for 4 doses  -PT/OT  -If Hb falls below 7, consider 1u pRBC transfusion   #Poor PO intake Continues to have poor PO intake   -Dietician following -Feeding supplement  -Monitor daily weights  -Consider  involving palliative care for GOC if worsening    #Respiratory distress, resolved    #Pulmonary edema  Rapid response 8/7 AM for c/o SOB. CXR showed slightly increased interstitial markings bilaterally c/f mild pulmonary edema. No consolidation or pleural effusion found. HDS  -Robitussin 100mg /54ml liquid  -Mucinex   -Flutter valve   #Sacral wound  Reported Stage 2 sacral wound.   -WOCN consulted   #Trach collar (removed)  H/o laryngeal stenosis, stable  Decannulated tracheostomy tube on 8/1    -Continuous pulse ox, monitor vitals  -Wean O2 as tolerated, on 2L nasal cannula  -ENT following,  appreciate recs  -SLP following   #Dementia  Reported onset of dementia in 2021. -Continue home donepezil 10mg  daily    Chronic Problems   #Atrial fibrillation, controlled #HLD, controlled  #H/o stroke Reported stroke on 04/2019 with residual expressive aphasia. Last lipid panel showed LDL 55 (05/29/23) -Cont home rosuvastatin 40mg   -Restart Eliquis 2.5mg  BID    #Chronic CHF  HCM  #Complete heart block s/p PPM (1986)  Follows with HF clinic.   -Hold metoprolol succinate 12.5mg  daily iso hypotension  -May consider home diuretic on non-dialysis days based on volume status      #HTN, controlled -Continue to monitor  This is a Psychologist, occupational Note.  The care of the patient was discussed with Dr. Thomasene Ripple  and the assessment and plan formulated with their assistance.  Please see their attached note for official documentation of the daily encounter.   LOS: 16 days   Loyal Buba 07/23/2023, 10:55 AM

## 2023-07-23 NOTE — Progress Notes (Signed)
[name] Progress Note   Assessment/ Plan:   [name] is a/an 77 y.o. female with a past medical history significant for CKD V, CHF, GI bleeding/AVMs, DM2, afib, dementia, frailty, admitted for GI bleeding and ARF.          AKI on CKD V --> ESRD.  Family wants to pursue dialysis             - Continue HD on TTS schedule- next due on 8/8             -  ESRD CLIP-  has been accepted at [facility] tts second- can start as early as 8/10               - continue with AVG, working well             -  midodrine 10mg  with HD-  inc to bid every day             - pal care involved             - needs to sit in chair for HD- did so on Sat and Tuesday.  Is displaying some not so great behaviors the last 48 hours-  brings to doubt that she is appropriate for OP HD-  if she perks up after HD and is back to her usual pleasant dementia self that we can proceed with plan-  if she remains sad, crying with many complaints it will be difficult to manage in OP unit and will need to re address with the family    2.  Hyperkalemia: resolved   3.  Anemia:             - suspect GI bleed on top of anemia of renal failure             - s/p push enteroscopy             - transfusions 8/2 2 u pRBCs- iron lower earler in hosp-  giving iron                - Start esa as well -   will inc dose- fairly stable 8/4-  8/6-  inc on 8/7   4.  BMM             - PTH 425, add vitamin D- phos at goal             - ca corrects to normal when accounting for albumin   5.  Dementia:             - on Aricept             - pal care involved     6. Nutrition: albumin 2. Push protein  7. Dispo-  given issue of SOB today this may delay discharge-  getting CXR and breathing tx   Subjective:    Looks like SOB resolved yesterday and it was possibly discussed that she would go to SNF maybe tomorrow.  She is sad today, says her stomach hurts-  begs for me to help her-  planning for HD later today   Objective:    BP (!) 93/53 (BP Location: Right Arm)   Pulse 71   Temp (!) 97.3 F (36.3 C) (Oral)   Resp 20   Ht 5\' 5"  (1.651 m)   Wt 48.7 kg   SpO2 93%   BMI 17.87 kg/m   Physical Exam: WVP:XTGGY in bed, faster  resp rate-  upper airway congestion-  pulse ox 100 on supp O2 CVS: RRR Resp: clear, trach site bandaged Abd: soft Ext: no LE edema ACCESS: AVG- patent  Labs: BMET Recent Labs  Lab 07/17/23 0903 07/18/23 0643 07/19/23 0206 07/20/23 0406 07/31/2023 0840 07/22/23 0846  NA 134* 134* 135 131* 133* 135  K 4.2 4.3 3.7 4.1 4.4 4.5  CL 94* 95* 96* 92* 93* 92*  CO2 26 24 25 26 26 25   GLUCOSE 109* 121* 89 95 119* 113*  BUN 32* 61* 27* 48* 72* 36*  CREATININE 3.37* 4.42* 2.62* 4.18* 5.50* 3.80*  CALCIUM 8.1* 8.1* 8.1* 7.9* 8.2* 8.7*  PHOS  --  2.5  --   --   --   --    CBC Recent Labs  Lab 07/18/23 1210 07/19/23 0206 07/19/23 1432 07/20/23 0406 08/05/2023 0840 07/22/23 0846  WBC 14.1* 9.8  --  8.5 8.6 9.2  NEUTROABS 11.8* 8.1*  --   --   --   --   HGB 9.3* 8.3* 8.0* 7.9* 7.8* 8.3*  HCT 27.8* 26.6* 25.5* 25.2* 24.5* 26.4*  MCV 88.0 88.7  --  90.0 92.1 92.3  PLT 178 181  --  189 204 227      Medications:     sodium chloride   Intravenous Once   sodium chloride   Intravenous Once   apixaban  2.5 mg Oral BID   calcitRIOL  0.25 mcg Oral Q T,Th,Sa-HD   Chlorhexidine Gluconate Cloth  6 each Topical Q0600   Chlorhexidine Gluconate Cloth  6 each Topical Q0600   darbepoetin (ARANESP) injection - DIALYSIS  60 mcg Subcutaneous Q Tue-1800   donepezil  10 mg Oral QHS   feeding supplement  237 mL Oral BID BM   guaiFENesin  600 mg Oral BID   lidocaine  1 patch Transdermal Q24H   midodrine  10 mg Oral BID WC   mirtazapine  7.5 mg Oral QHS   pantoprazole  40 mg Oral BID   rosuvastatin  40 mg Oral QHS   sucralfate  1 g Oral TID AC & HS    Gorgeous Newlun A Breia Ocampo  07/23/2023, 10:41 AM

## 2023-07-23 NOTE — TOC Progression Note (Signed)
Transition of Care [facility]) - Initial/Assessment Note    Patient Details  Name: [name] MRN: [id number] Date of Birth: 1946-07-27  [facility] CM/SW Contact:    Ralene Bathe, LCSW Phone Number: 07/23/2023, 11:23 AM  Clinical Narrative:                 LCSW informed that the patient cannot start at the out-pt HD clinic until Tuesday, 07/28/2023.  LCSW updated [facility] social worker, Marylu Lund, and admissions at [facility].  The facility can accept the patient over the weekend.  TOC following.          Patient Goals and CMS Choice            Expected Discharge Plan and Services         Expected Discharge Date: 07/22/23                                    Prior Living Arrangements/Services                       Activities of Daily Living Home Assistive Devices/Equipment: None ADL Screening (condition at time of admission) Patient's cognitive ability adequate to safely complete daily activities?: Yes Is the patient deaf or have difficulty hearing?: No Does the patient have difficulty seeing, even when wearing glasses/contacts?: No Does the patient have difficulty concentrating, remembering, or making decisions?: Yes Patient able to express need for assistance with ADLs?: Yes Does the patient have difficulty dressing or bathing?: Yes Independently performs ADLs?: No Does the patient have difficulty walking or climbing stairs?: Yes Weakness of Legs: Both Weakness of Arms/Hands: None  Permission Sought/Granted                  Emotional Assessment              Admission diagnosis:  Hyperkalemia [E87.5] Acute on chronic anemia [D64.9] Patient Active Problem List   Diagnosis Date Noted   Hyponatremia 07/20/2023   Pressure injury of coccygeal region, stage 2 (HCC) 07/15/2023   Failure to thrive in adult 07/12/2023   Protein-calorie malnutrition, severe 07/10/2023   Discharge planning issues 07/10/2023   AVM (arteriovenous  malformation) of duodenum, acquired with hemorrhage 07/09/2023   AVM (arteriovenous malformation) of stomach, acquired with hemorrhage 07/09/2023   Melena 07/08/2023   ESRD on dialysis (HCC) 07/08/2023   Acute encephalopathy 07/08/2023   Gastrointestinal hemorrhage 07/08/2023   Acute on chronic anemia 07/07/2023   AKI (acute kidney injury) (HCC) 04/23/2020   Cough with hemoptysis 08/02/2019   SOB (shortness of breath)    Atypical chest pain    Chest pain on exertion 05/15/2019   Cerebral embolism with cerebral infarction 05/09/2019   Expressive aphasia 05/08/2019   Other abnormal glucose 11/26/2018   Polypharmacy 06/25/2018   Depression with anxiety    Tracheostomy present [facility])    Tracheostomy care (HCC)    Calculus of gallbladder without cholecystitis without obstruction 12/24/2017   Chronic cough 09/25/2017   History of colonic polyps    Polyp of transverse colon    Polyp of sigmoid colon    Moderate protein-calorie malnutrition (HCC) 06/02/2017   Chronic idiopathic constipation 03/19/2017   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   Respiratory distress    Permanent atrial fibrillation (HCC)    Laryngeal stenosis 09/18/2016   Esophageal dysphagia    Polymyalgia rheumatica (HCC) 04/10/2015   Hypertrophic cardiomyopathy (HCC)  02/15/2015   Eczema 09/18/2014   Environmental allergies 09/18/2014   Pre-diabetes 11/26/2013   COPD, moderate (HCC) 11/01/2013   Complete heart block (HCC) 04/03/2011   Acute on chronic diastolic heart failure (HCC) 02/20/2011   Anemia 02/17/2011   AVM (arteriovenous malformation) of small bowel, acquired 02/13/2011   Current use of long term anticoagulation 01/15/2011   PACEMAKER-St.Jude 11/28/2010   CKD (chronic kidney disease) stage 4, GFR 15-29 ml/min (HCC) 09/24/2010   Acute on chronic congestive heart failure (HCC) 08/30/2010   ARTHRITIS 07/24/2010   Generalized anxiety disorder 07/23/2010   Hyperlipidemia 02/11/2007   Essential hypertension  02/11/2007   APNEA, SLEEP 02/11/2007   PCP:  [facility]:   [facility] - [place], [place] - [address] [place] [address] Phone: [phone] Fax: [phone]     Social Determinants of Health (SDOH) Social History: SDOH Screenings   Food Insecurity: No Food Insecurity (07/13/2023)  Housing: Low Risk  (07/13/2023)  Transportation Needs: No Transportation Needs (07/13/2023)  Utilities: Not At Risk (07/13/2023)  Depression (PHQ2-9): Low Risk  (05/20/2019)  Tobacco Use: Medium Risk (07/09/2023)   SDOH Interventions: Transportation Interventions: Intervention Not Indicated, Inpatient TOC, Patient Resources (Friends/Family)   Readmission Risk Interventions    07/08/2023    3:26 PM  Readmission Risk Prevention Plan  Transportation Screening Complete  PCP or Specialist Appt within 3-5 Days Complete  HRI or Home Care Consult Complete  Social Work Consult for Recovery Care Planning/Counseling Complete  Palliative Care Screening Not Applicable  Medication Review Oceanographer) Referral to Pharmacy

## 2023-07-23 NOTE — Progress Notes (Signed)
Received patient in bed to unit.  Alert x 1 Informed consent signed and in chart.   TX duration:3.5  Patient didn't tolerate well. Patients blood pressure constantly dropped throughout treatment ( see flowsheet) 300 Ml bolus of saline was required to stabilize. MD Kathrene Bongo notified.   Transported back to the room  Alert, without acute distress.  Hand-off given to patient's nurse.   Access used: AVG Access issues: n/a    07/23/23 1803  Vitals  Temp 98.2 F (36.8 C)  Temp Source Oral  BP 132/82  MAP (mmHg) 97  BP Location Right Arm  BP Method Automatic  Pulse Rate 65  ECG Heart Rate 76  Resp (!) 28  Oxygen Therapy  SpO2 97 %  O2 Device Nasal Cannula  O2 Flow Rate (L/min) 2 L/min  During Treatment Monitoring  Blood Flow Rate (mL/min) 350 mL/min  Arterial Pressure (mmHg) -154.34 mmHg  Venous Pressure (mmHg) 263 mmHg  TMP (mmHg) -4.44 mmHg  Ultrafiltration Rate (mL/min) 0 mL/min  Dialysate Flow Rate (mL/min) 300 ml/min  Intra-Hemodialysis Comments Tx completed  Dialysate Potassium Concentration 3  Dialysate Calcium Concentration 2.5  Post Treatment  Dialyzer Clearance Lightly streaked  Duration of HD Treatment -hour(s) 2.98 hour(s)  Hemodialysis Intake (mL) 300 mL (saline bolus)  Liters Processed 63  Fluid Removed (mL) 100 mL  Tolerated HD Treatment No (Comment)  Post-Hemodialysis Comments patient blood pressure constant dropping.  Fistula / Graft Left Upper arm Arteriovenous vein graft  Placement Date/Time: 06/02/23 1042   Orientation: Left  Access Location: Upper arm  Access Type: Arteriovenous vein graft  Expiration Date: 01/20/28  Site Condition No complications  Fistula / Graft Assessment Present;Thrill;Bruit  Status Deaccessed           [facility]

## 2023-07-23 NOTE — Progress Notes (Signed)
Occupational Therapy Treatment Patient Details Name: [name] MRN: [id number] DOB: [birth year] Today's Date: 07/23/2023   History of present illness 77 y.o. female presents to [facility] hospital on 07/07/2023 with symptomatic anemia and FTT. Small bowel enteroscopy on 7/25. Tracheostomy decannulated 8/1. PMH includes afib, ESRD, COPD, CVA, CHF, laryngeal stenosis s/p tracheostomy 2017, chronic GIB, dementia.   OT comments  Patient received in supine and agreeable to OT session. Patient with limited progress this treatment session with patient requiring mod/max assist to get to EOB. Patient performed grooming tasks seated on EOB with CGA to supervision. Patient demonstrating difficulty holding onto toothbrush this session and requiring min assist to complete grooming tasks. Patient assisted back to supine and was found to have been bowel incontinent and performed rolling to address. Patient would benefit from continued OT services to address grooming, bathing, dressing, and toilet transfers.       If plan is discharge home, recommend the following:  Two people to help with bathing/dressing/bathroom;Two people to help with walking and/or transfers;Assistance with cooking/housework;Help with stairs or ramp for entrance;Assist for transportation;Direct supervision/assist for financial management;Direct supervision/assist for medications management   Equipment Recommendations  Other (comment) (TBD, defer to next venue)    Recommendations for Other Services      Precautions / Restrictions Precautions Precautions: Fall Precaution Comments: Trach decannulated 8/1, watch BP Restrictions Weight Bearing Restrictions: No       Mobility Bed Mobility Overal bed mobility: Needs Assistance Bed Mobility: Rolling, Sidelying to Sit, Sit to Sidelying Rolling: Mod assist Sidelying to sit: Mod assist, Max assist     Sit to sidelying: Max assist General bed mobility comments: Patient able to assist with  rolling and getting to EOB, required max assist to return to sidelying due to fatigue    Transfers                         Balance Overall balance assessment: Needs assistance Sitting-balance support: Feet supported, Single extremity supported Sitting balance-Leahy Scale: Poor Sitting balance - Comments: CGA to supervision for sitting on EOB Postural control: Posterior lean                                 ADL either performed or assessed with clinical judgement   ADL Overall ADL's : Needs assistance/impaired     Grooming: Wash/dry hands;Wash/dry face;Oral care;Minimal assistance;Cueing for sequencing;Sitting Grooming Details (indicate cue type and reason): Patiend demonstrated difficulty holding onto toothbrush and required min assist to complete while seated on EOB     Lower Body Bathing: Maximal assistance;Bed level Lower Body Bathing Details (indicate cue type and reason): patient performed rolling in bed to allow for LB bathing due to bowel incontinance                            Extremity/Trunk Assessment              Vision       Perception     Praxis      Cognition Arousal: Alert Behavior During Therapy: WFL for tasks assessed/performed Overall Cognitive Status: History of cognitive impairments - at baseline                                 General Comments: demonstrated perseveration with grooming tasks  Exercises      Shoulder Instructions       General Comments on 2 liters of O2 with SpO2 93-100%    Pertinent Vitals/ Pain       Pain Assessment Pain Assessment: Faces Faces Pain Scale: Hurts little more Pain Location: bottom Pain Descriptors / Indicators: Grimacing, Guarding, Discomfort Pain Intervention(s): Limited activity within patient's tolerance, Monitored during session, Repositioned  Home Living                                          Prior  Functioning/Environment              Frequency  Min 1X/week        Progress Toward Goals  OT Goals(current goals can now be found in the care plan section)  Progress towards OT goals: Progressing toward goals  Acute Rehab OT Goals Patient Stated Goal: get better OT Goal Formulation: With patient Time For Goal Achievement: 07/24/23 Potential to Achieve Goals: Good ADL Goals Pt Will Perform Grooming: with set-up;with supervision;sitting Pt Will Perform Upper Body Bathing: with supervision;with set-up;sitting Pt Will Perform Lower Body Bathing: with contact guard assist Pt Will Perform Upper Body Dressing: with supervision;with set-up;sitting Pt Will Perform Lower Body Dressing: with contact guard assist Pt Will Transfer to Toilet: with contact guard assist Pt Will Perform Toileting - Clothing Manipulation and hygiene: with contact guard assist Additional ADL Goal #1: Pt will be S in and OOB for baisc ADLs  Plan Discharge plan remains appropriate    Co-evaluation                 AM-PAC OT "6 Clicks" Daily Activity     Outcome Measure   Help from another person eating meals?: A Little Help from another person taking care of personal grooming?: A Little Help from another person toileting, which includes using toliet, bedpan, or urinal?: A Lot Help from another person bathing (including washing, rinsing, drying)?: A Lot Help from another person to put on and taking off regular upper body clothing?: A Little Help from another person to put on and taking off regular lower body clothing?: Total 6 Click Score: 14    End of Session Equipment Utilized During Treatment: Oxygen  OT Visit Diagnosis: Other abnormalities of gait and mobility (R26.89);Muscle weakness (generalized) (M62.81);Other symptoms and signs involving cognitive function   Activity Tolerance Patient tolerated treatment well   Patient Left in bed;with call bell/phone within reach;with bed alarm set    Nurse Communication Mobility status        Time: 2956-2130 OT Time Calculation (min): 36 min  Charges: OT General Charges $OT Visit: 1 Visit OT Treatments $Self Care/Home Management : 8-22 mins $Therapeutic Activity: 8-22 mins  Alfonse Flavors, OTA Acute Rehabilitation Services  Office [phone]   Dewain Penning 07/23/2023, 1:36 PM

## 2023-07-23 NOTE — Consult Note (Signed)
WOC Nurse Consult Note: Reason for Consult: Sacral wound  Wound type: 1.  Stage 2 coccyx  Pressure Injury that extends to bilateral buttocks (R greater than L)  2.  L posterior leg (just superior to heel) Deep Tissue Pressure Injury  Pressure Injury POA: No  Measurement: 1.  Stage 2 coccyx PI that extends to bilateral buttocks 6 cm x 11 cm total area 100% red moist  2.  Deep Tissue Pressure Injury L posterior lower leg (just superior to heel) 3 cm x 1 cm purple maroon discoloration with dry red hemorrhagic tissue   Drainage (amount, consistency, odor) minimal serosanguinous coccyx/buttocks, L posterior lower leg dry  Periwound: intact Dressing procedure/placement/frequency:  Clean coccyx/buttocks wounds with NS, apply Xeroform gauze [place] [phone]) to wound bed daily, cover with silicone foam or ABD pad whichever is preferred.  2.   Clean L posterior lower leg wound with NS, apply Xeroform gauze [place] [phone]) to wound bed daily, cover with silicone foam. Place bilateral feet in Prevalon boots while in bed.     At the time of this visit patient was noted to be wearing compression hose (from home) and had legs wrapped in Ace bandage.  Also had Prevalon boots on.  After I provided wound care to L posterior lower leg I reapplied compression hose and Ace bandage as before.    POC discussed with patient and bedside nurse.  WOC team will not follow at this time Re-consult if further needs arise.   Thank you,     Priscella Mann MSN, RN-BC, [employer] [phone]

## 2023-07-23 NOTE — Progress Notes (Signed)
Out-pt HD clinic is unable to start pt on Saturday. Clinic can start pt on Tuesday, 8/13. This info was provided to CSW and attending. Nephrology note for today reviewed. Will assist as needed.   Olivia Canter Renal Navigator [phone]

## 2023-07-24 DIAGNOSIS — E43 Unspecified severe protein-calorie malnutrition: Secondary | ICD-10-CM | POA: Diagnosis not present

## 2023-07-24 DIAGNOSIS — N186 End stage renal disease: Secondary | ICD-10-CM | POA: Diagnosis not present

## 2023-07-24 DIAGNOSIS — R627 Adult failure to thrive: Secondary | ICD-10-CM | POA: Diagnosis not present

## 2023-07-24 DIAGNOSIS — G9340 Encephalopathy, unspecified: Secondary | ICD-10-CM

## 2023-07-24 DIAGNOSIS — D649 Anemia, unspecified: Secondary | ICD-10-CM | POA: Diagnosis not present

## 2023-07-24 DIAGNOSIS — L89152 Pressure ulcer of sacral region, stage 2: Secondary | ICD-10-CM

## 2023-07-24 LAB — BASIC METABOLIC PANEL
Anion gap: 16 — ABNORMAL HIGH (ref 5–15)
BUN: 29 mg/dL — ABNORMAL HIGH (ref 8–23)
CO2: 27 mmol/L (ref 22–32)
Calcium: 8.7 mg/dL — ABNORMAL LOW (ref 8.9–10.3)
Chloride: 93 mmol/L — ABNORMAL LOW (ref 98–111)
Creatinine, Ser: 3.35 mg/dL — ABNORMAL HIGH (ref 0.44–1.00)
GFR, Estimated: 13 mL/min — ABNORMAL LOW (ref >60)
Glucose, Bld: 96 mg/dL (ref 70–99)
Potassium: 4.6 mmol/L (ref 3.5–5.1)
Sodium: 136 mmol/L (ref 135–145)

## 2023-07-24 LAB — CBC
HCT: 25.5 % — ABNORMAL LOW (ref 36.0–46.0)
Hemoglobin: 7.7 g/dL — ABNORMAL LOW (ref 12.0–15.0)
MCH: 27.8 pg (ref 26.0–34.0)
MCHC: 30.2 g/dL (ref 30.0–36.0)
MCV: 92.1 fL (ref 80.0–100.0)
Platelets: 235 10*3/uL (ref 150–400)
RBC: 2.77 MIL/uL — ABNORMAL LOW (ref 3.87–5.11)
RDW: 16.9 % — ABNORMAL HIGH (ref 11.5–15.5)
WBC: 13.2 10*3/uL — ABNORMAL HIGH (ref 4.0–10.5)
nRBC: 0.7 % — ABNORMAL HIGH (ref 0.0–0.2)

## 2023-07-24 MED ORDER — HYDROMORPHONE HCL 1 MG/ML IJ SOLN
0.5000 mg | INTRAMUSCULAR | Status: DC | PRN
  Administered 2023-07-25 – 2023-07-26 (×4): 0.5 mg via INTRAVENOUS
  Filled 2023-07-24 (×4): qty 1

## 2023-07-24 MED ORDER — ENSURE ENLIVE PO LIQD: 237.0000 mL | Freq: Two times a day (BID) | ORAL | Status: DC | PRN

## 2023-07-24 MED ORDER — GLYCOPYRROLATE 0.2 MG/ML IJ SOLN
0.4000 mg | INTRAMUSCULAR | Status: DC
  Administered 2023-07-24 – 2023-07-25 (×3): 0.4 mg via INTRAVENOUS
  Filled 2023-07-24 (×6): qty 2

## 2023-07-24 MED ORDER — LORAZEPAM 2 MG/ML IJ SOLN
1.0000 mg | INTRAMUSCULAR | Status: DC | PRN
  Administered 2023-07-25: 1 mg via INTRAVENOUS
  Filled 2023-07-24: qty 1

## 2023-07-24 MED ORDER — GLYCOPYRROLATE 0.2 MG/ML IJ SOLN: 0.4000 mg | INTRAMUSCULAR | Status: DC | PRN

## 2023-07-24 MED ORDER — BIOTENE DRY MOUTH MT LIQD: 15.0000 mL | OROMUCOSAL | Status: DC | PRN

## 2023-07-24 MED ORDER — ORAL CARE MOUTH RINSE: 15.0000 mL | OROMUCOSAL | Status: DC | PRN

## 2023-07-24 MED ORDER — ORAL CARE MOUTH RINSE
15.0000 mL | OROMUCOSAL | Status: DC
  Administered 2023-07-24 – 2023-07-26 (×11): 15 mL via OROMUCOSAL

## 2023-07-24 MED ORDER — ONDANSETRON HCL 4 MG/2ML IJ SOLN: 4.0000 mg | Freq: Four times a day (QID) | INTRAMUSCULAR | Status: DC | PRN

## 2023-07-24 MED ORDER — SCOPOLAMINE 1 MG/3DAYS TD PT72
1.0000 | MEDICATED_PATCH | TRANSDERMAL | Status: DC
  Administered 2023-07-24: 1.5 mg via TRANSDERMAL
  Filled 2023-07-24: qty 1

## 2023-07-24 MED ORDER — POLYVINYL ALCOHOL 1.4 % OP SOLN: 1.0000 [drp] | Freq: Four times a day (QID) | OPHTHALMIC | Status: DC | PRN

## 2023-07-24 MED ORDER — BISACODYL 10 MG RE SUPP: 10.0000 mg | Freq: Every day | RECTAL | Status: DC | PRN

## 2023-07-24 NOTE — Progress Notes (Addendum)
[facility] Progress Note   Assessment/ Plan:   [name] is a/an 77 y.o. female with a past medical history significant for CKD V, CHF, GI bleeding/AVMs, DM2, afib, dementia, frailty, admitted for GI bleeding and ARF.          AKI on CKD V --> ESRD.  Family wants to pursue dialysis             - Continue HD on TTS schedule- next due on 8/8             -  ESRD CLIP-  has been accepted at [facility] tts second- can start as early as 8/10               - continue with AVG, working well             -  midodrine 10mg  with HD-  inc to bid every day             - pal care involved             - needs to sit in chair for HD- did so on Sat and Tuesday.  Is displaying some not so great behaviors the last 72 hours, is constantly in distress - not able to console -  brings to doubt that she is appropriate for OP HD-   she remains sad, crying with many complaints, very sad, just says help me,  In addition her BP really did not tolerate dialysis the last 2 treatments so that is an issue as well.  I have left voice mail messages for son and daughter today so that we can discuss.  I just think she is not a candidate for dialysis given these issues.  I have also communicated this to the primary team and they were to involve palliative care-  will not do dialysis tomorrow   I was able to talk with pts son and daughter-  explained my concerns- they were leaning toward transitioning to comfort and then palliative saw and plans have been made to head that direction.  Renal will sign off-  call with questions    2.  Hyperkalemia: resolved   3.  Anemia:             - suspect GI bleed on top of anemia of renal failure             - s/p push enteroscopy             - transfusions 8/2 2 u pRBCs- iron lower earler in hosp-  giving iron                - Start esa as well -   will inc dose- fairly stable 8/4-  8/6-  inc on 8/7   4.  BMM             - PTH 425, add vitamin D- phos at goal              - ca corrects to normal when accounting for albumin   5.  Dementia:             - on Aricept             - pal care involved     6. Nutrition: albumin 2. Push protein  7. Dispo-  as above-  has not done well -  would say that she is not really a candidate for dialysis at this time  Subjective:    HD did not go so well yesterday- BP dropping -  has now been consistently distressed for the last 48 hours-  just says "help me"    Objective:   BP (!) 101/47 (BP Location: Right Arm)   Pulse 69   Temp (!) 97.5 F (36.4 C) (Oral)   Resp 20   Ht 5\' 5"  (1.651 m)   Wt 48 kg   SpO2 98%   BMI 17.61 kg/m   Physical Exam: ONG:EXBMW in bed, just says that she feels bad-  repeats "help me" CVS: RRR Resp: clear, trach site bandaged Abd: soft Ext: no LE edema ACCESS: AVG- patent  Labs: BMET Recent Labs  Lab 07/18/23 0643 07/19/23 0206 07/20/23 0406 07-25-2023 0840 07/22/23 0846 07/23/23 1200 07/24/23 0725  NA 134* 135 131* 133* 135 136 136  K 4.3 3.7 4.1 4.4 4.5 5.3* 4.6  CL 95* 96* 92* 93* 92* 98 93*  CO2 24 25 26 26 25 24 27   GLUCOSE 121* 89 95 119* 113* 97 96  BUN 61* 27* 48* 72* 36* 55* 29*  CREATININE 4.42* 2.62* 4.18* 5.50* 3.80* 5.45* 3.35*  CALCIUM 8.1* 8.1* 7.9* 8.2* 8.7* 8.7* 8.7*  PHOS 2.5  --   --   --   --   --   --    CBC Recent Labs  Lab 07/18/23 1210 07/19/23 0206 07/19/23 1432 07/25/23 0840 07/22/23 0846 07/23/23 1200 07/24/23 0725  WBC 14.1* 9.8   < > 8.6 9.2 11.3* 13.2*  NEUTROABS 11.8* 8.1*  --   --   --   --   --   HGB 9.3* 8.3*   < > 7.8* 8.3* 8.2* 7.7*  HCT 27.8* 26.6*   < > 24.5* 26.4* 26.2* 25.5*  MCV 88.0 88.7   < > 92.1 92.3 89.7 92.1  PLT 178 181   < > 204 227 236 235   < > = values in this interval not displayed.      Medications:     sodium chloride   Intravenous Once   sodium chloride   Intravenous Once   apixaban  2.5 mg Oral BID   calcitRIOL  0.25 mcg Oral Q T,Th,Sa-HD   Chlorhexidine Gluconate Cloth  6 each Topical Q0600    Chlorhexidine Gluconate Cloth  6 each Topical Q0600   darbepoetin (ARANESP) injection - DIALYSIS  60 mcg Subcutaneous Q Tue-1800   donepezil  10 mg Oral QHS   feeding supplement  237 mL Oral BID BM   guaiFENesin  600 mg Oral BID   lidocaine  1 patch Transdermal Q24H   midodrine  10 mg Oral BID WC   mirtazapine  7.5 mg Oral QHS   mouth rinse  15 mL Mouth Rinse 4 times per day   pantoprazole  40 mg Oral BID   rosuvastatin  40 mg Oral QHS    [name] A Harika Laidlaw  07/24/2023, 11:58 AM

## 2023-07-24 NOTE — Progress Notes (Signed)
Daily Progress Note   Patient Name: [name]       Date: 07/24/2023 DOB: 1946-10-06  Age: 77 y.o. MRN#: [id number] Attending Physician: Gust Rung, DO Primary Care Physician: [facility] Admit Date: 07/03/2023  Reason for Consultation/Follow-up: Establishing goals of care  Subjective: Medical records reviewed including progress notes, labs, imaging. Patient assessed at the bedside.  She is minimally responsive and very lethargic.  Her son Luanna Salk and his partner Julieta Bellini are present visiting.   Reviewed patient's acute illness with her son including rationale for patient no longer being a candidate for dialysis.  Discussed her poor prognosis of days at best and high risk for suffering.  Recommended comfort focused care, as well as DNR/DNI.  He is quite emotional and wants to see how his sister Christella Scheuermann feels about this.  He plans to go home briefly and then return send.  Emotional support and therapeutic listening was provided. Mika notes her agreement with very short prognosis, as she went through this with her uncle recently.  She wonders if hospice would be a good place for Brady, as she has had a good experience in the past.  I then called patient's daughter/HCPOA [name] to review the above conversation.  She shares that she does not want patient to suffer any further.  Shared recommendations for comfort care, explaining that patient would no longer receive aggressive medical interventions such as continuous vital signs, lab work, radiology testing, or medications not focused on comfort. All care would focus on how the patient is looking and feeling. This would include management of any symptoms that may cause discomfort, pain, shortness of breath, cough, nausea,  agitation, anxiety, and/or secretions etc. Symptoms would be managed with medications and other non-pharmacological interventions such as spiritual support if requested, repositioning, music therapy, or therapeutic listening. Family verbalized understanding and appreciation.  Daughter is agreeable, though concerned about disposition and SNF ability to manage her end-of-life care needs.  She would prefer patient remain at [facility] for in hospital death.  I shared that hospice facility would also be an appropriate option if she is open to it, though patient would likely die within the next few days and could remain in the hospital throughout this time.  I returned the bedside and met with patient's 2 daughters [place].  Provided updates on the above conversations and current comfort focused care plan.  Provided with gone from my site booklet.  We discussed patient's rapid buildup of secretions and symptom management strategies.  Discussed with RN.  Questions and concerns addressed. PMT will continue to support holistically.   Length of Stay: 17   Physical Exam Vitals and nursing note reviewed.  Constitutional:      Appearance: She is ill-appearing.     Interventions: Nasal cannula in place.     Comments: Excessive secretions  Cardiovascular:     Rate and Rhythm: Normal rate.  Pulmonary:     Effort: No respiratory distress.  Skin:    General: Skin is warm and dry.  Neurological:     Mental Status: She is lethargic.     Comments: Minimally responsive  Vital Signs: BP (!) 101/47 (BP Location: Right Arm)   Pulse 69   Temp (!) 97.5 F (36.4 C) (Oral)   Resp 20   Ht 5\' 5"  (1.651 m)   Wt 48 kg   SpO2 98%   BMI 17.61 kg/m  SpO2: SpO2: 98 % O2 Device: O2 Device: Nasal Cannula O2 Flow Rate: O2 Flow Rate (L/min): 2 L/min      Palliative Assessment/Data: 10%    Palliative Care Assessment & Plan   Patient Profile: 77 y.o. female  admitted on 07/02/2023 with PMHx of  complete heart block s/p Abbott PPM in 1986, atrial fibrillation, HTN, aortic atherosclerosis, laryngeal stenosis, CVA 04/2019, AVMs/GI bleed in 2012, HCM/chronic congestive heart failure, ESRD s/p fistula (not on HD yet), and COPD who presents with decreased PO iOver the last few days she has been reported to have melena however she has had a history of GI bleed and has developed melena in the past. According to the family, there was no identifiable source of her prior GI bleed. ntake x 2-3 weeks and melena reported over the last 3 days.    Over the last  week she has reportedly deviated from her baseline functional status. At her baseline, she was able to walk at home with a cane, dress herself, and shower but she has since been unable to. She lives at home with her daughter in [place], [place] and follows with [facility].    06/24/2023  she was to see nephrology to determine the start date of her dialysis, but reportedly she felt ill and was recommended to come to the ED instead.     Family report that patient is normally able to complete all of her ADLs independently   She has had a tracheostomy for 5 years after it was reported she developed respiratory failure following propofol administration during Watchman procedure and has since developed laryngeal stenosis.   Patient and family face ongoing treatment option decisions,Advanced directive decisions and anticipatory care needs.  Assessment: Goals of care conversation End-stage renal disease on dialysis Angiodysplastic lesions s/p enteroscopy  Laryngeal stenosis status post tracheostomy End-of-life care Dementia  Recommendations/Plan: CODE STATUS changed to DNR/DNI Transition to full comfort focused care today Dilaudid PRN for pain/air hunger/comfort Robinul Q4H and scopolamine patch for excessive secretions Ativan PRN for agitation/anxiety Zofran PRN for nausea Liquifilm tears PRN for dry eyes Unrestricted visitations in the setting of EOL  (per policy) Oxygen PRN 2L or less for comfort. No escalation.   Psychosocial and emotional support provided PMT will continue to follow and support   Prognosis:  Hours - Days  Discharge Planning: Anticipated Hospital Death  Care plan was discussed with patient's family, RN, IMTS   Total time: I spent 90 minutes in the care of the patient today in the above activities and documenting the encounter.  MDM high         Timber Marshman Jeni Salles, PA-C  Palliative Medicine Team Team phone # [phone]  Thank you for allowing the Palliative Medicine Team to assist in the care of this patient. Please utilize secure chat with additional questions, if there is no response within 30 minutes please call the above phone number.  Palliative Medicine Team providers are available by phone from 7am to 7pm daily and can be reached through the team cell phone.  Should this patient require assistance outside of these hours, please call the patient's attending physician.

## 2023-07-24 NOTE — Progress Notes (Addendum)
Subjective: [name] is a 77 year-old female who has a PMHx of of complete heart block s/p Abbott PPM in 1986, atrial fibrillation, HTN, aortic atherosclerosis, laryngeal stenosis s/p tracheostomy, CVA 04/2019, GI bleed 2012, AVMs, HCM/chronic congestive heart failure, ESRD s/p fistula, COPD who presented with decreased PO intake and melena found to have acute on chronic anemia and GI bleed   Laying in bed and reports feeling weak. Some pain at lower extremity.   Objective: Vital signs in last 24 hours: Vitals:   07/23/23 1900 07/23/23 2101 07/24/23 0504 07/24/23 0818  BP:  119/68 (!) 163/119 (!) 101/47  Pulse:  70 80 69  Resp:  18 18 20   Temp:  98.3 F (36.8 C) 98.8 F (37.1 C) (!) 97.5 F (36.4 C)  TempSrc:  Oral Oral Oral  SpO2:  100% 94% 98%  Weight: 48 kg     Height:       Weight change: -1.1 kg  Intake/Output Summary (Last 24 hours) at 07/24/2023 1145 Last data filed at 07/24/2023 0600 Gross per 24 hour  Intake 0 ml  Output -274.1 ml  Net 274.1 ml   Physical Exam: General appearance: frail, NAD  Eyes: conjunctivae/corneas clear. PERRL, EOM's intact Neck:  tracheostomy valve removed, dressing in place  Lungs: CTAB, normal WOB Heart: RRR, S1/S2 normal, systolic murmur at RUSB  Abdomen: soft, NTND Extremities: LUE AV graft fistula in place Neurologic: alert and oriented x3  Lab Results:    Latest Ref Rng & Units 07/24/2023    7:25 AM 07/23/2023   12:00 PM 07/22/2023    8:46 AM  CBC  WBC 4.0 - 10.5 K/uL 13.2  11.3  9.2   Hemoglobin 12.0 - 15.0 g/dL 7.7  8.2  8.3   Hematocrit 36.0 - 46.0 % 25.5  26.2  26.4   Platelets 150 - 400 K/uL 235  236  227       Latest Ref Rng & Units 07/24/2023    7:25 AM 07/23/2023   12:00 PM 07/22/2023    8:46 AM  CMP  Glucose 70 - 99 mg/dL 96  97  161   BUN 8 - 23 mg/dL 29  55  36   Creatinine 0.44 - 1.00 mg/dL 0.96  0.45  4.09   Sodium 135 - 145 mmol/L 136  136  135   Potassium 3.5 - 5.1 mmol/L 4.6  5.3  4.5   Chloride 98 - 111  mmol/L 93  98  92   CO2 22 - 32 mmol/L 27  24  25    Calcium 8.9 - 10.3 mg/dL 8.7  8.7  8.7    No results for input(s): "GLUCAP" in the last 72 hours.  Micro Results: No results found for this or any previous visit (from the past 240 hour(s)).  Studies/Results: No results found.  Medications: I have reviewed the patient's current medications. Scheduled Meds:  sodium chloride   Intravenous Once   sodium chloride   Intravenous Once   apixaban  2.5 mg Oral BID   calcitRIOL  0.25 mcg Oral Q T,Th,Sa-HD   Chlorhexidine Gluconate Cloth  6 each Topical Q0600   Chlorhexidine Gluconate Cloth  6 each Topical Q0600   darbepoetin (ARANESP) injection - DIALYSIS  60 mcg Subcutaneous Q Tue-1800   donepezil  10 mg Oral QHS   feeding supplement  237 mL Oral BID BM   guaiFENesin  600 mg Oral BID   lidocaine  1 patch Transdermal Q24H   midodrine  10 mg Oral BID WC   mirtazapine  7.5 mg Oral QHS   mouth rinse  15 mL Mouth Rinse 4 times per day   pantoprazole  40 mg Oral BID   rosuvastatin  40 mg Oral QHS   Continuous Infusions:  ferric gluconate (FERRLECIT) IVPB 250 mg (07/24/23 1120)   PRN Meds:.acetaminophen **OR** acetaminophen, alum & mag hydroxide-simeth, guaiFENesin, mouth rinse, senna-docusate  Assessment/Plan: Principal Problem:   Acute on chronic anemia Active Problems:   Current use of long term anticoagulation   Respiratory distress   Tracheostomy present (HCC)   Melena   ESRD on dialysis (HCC)   Acute encephalopathy   Gastrointestinal hemorrhage   AVM (arteriovenous malformation) of duodenum, acquired with hemorrhage   AVM (arteriovenous malformation) of stomach, acquired with hemorrhage   Protein-calorie malnutrition, severe   Discharge planning issues   Failure to thrive in adult   Pressure injury of coccygeal region, stage 2 (HCC)  [name] is a 77 year-old female who has a PMHx of of complete heart block s/p Abbott PPM in 1986, atrial fibrillation, HTN,  aortic atherosclerosis, laryngeal stenosis s/p tracheostomy, CVA 04/2019, GI bleed 2012, AVMs, HCM/chronic congestive heart failure, ESRD s/p fistula, COPD who presented with decreased PO intake and melena found to have acute on chronic anemia and GI bleed   Active Problems   #ESRD  AV graft fistula #Hypotension, controlled   Hemodialysis schedule T/Th/Sat. Tolerated dialysis in chair x2. Approved for outpatient clinic approval.   -Received first dose of 2-dose series HBV vaccine (7/29). Plan for 2nd dose in 10-month outpatient  -Cont midodrine 10mg  BID for soft pressures  -Daily BMP   #Angiodysplastic lesions s/p enteroscopy  UGIB  H/o small bowel AVMs #Acute on chronic anemia, stable   Enteroscopy on 7/25 showed multiple angiodysplastic lesions treated with APC. Received ESA agent on 7/30 and 8/6. Iron repletion   -Cont PO protonix 40mg  BID  -ESA SQ every Tuesday  -Cont Eliquis 2.5mg  BID  -IV ferric gluconate 250mg  for 4 doses  -PT/OT  -If Hb falls below 7, consider 1u pRBC transfusion   #Poor PO intake Continues to have poor PO intake   -Dietician following -Feeding supplement  -Monitor daily weights  -Re-engaging palliative care for GOC discussion    #Respiratory distress, resolved    #Pulmonary edema  Rapid response 8/7 AM for c/o SOB. CXR showed slightly increased interstitial markings bilaterally c/f mild pulmonary edema. No consolidation or pleural effusion found. HDS   -Mucinex   -Robitussin PRN -Flutter valve   #Stage 2 Coccyx pressure injury #Left Posterior Leg Deep tissue pressure injury  Stage 2 coccyx pressure injury that extends to bilateral buttocks (R greater than L) and L posterior leg pressure injury    -Dressing placed -Continue to monitor    #Trach collar (removed)  H/o laryngeal stenosis, stable  Decannulated tracheostomy tube on 8/1    -Continuous pulse ox, monitor vitals  -Wean O2 as tolerated, on 2L nasal cannula  -ENT following,  appreciate recs  -SLP following   #Dementia  Reported onset of dementia in 2021. -Continue home donepezil 10mg  daily    Chronic Problems   #Atrial fibrillation, controlled #HLD, controlled  #H/o stroke Reported stroke on 04/2019 with residual expressive aphasia. Last lipid panel showed LDL 55 (05/29/23) -Cont home rosuvastatin 40mg   -Restart Eliquis 2.5mg  BID    #Chronic CHF  HCM  #Complete heart block s/p PPM (1986)  Follows with HF clinic.   -Hold metoprolol succinate 12.5mg  daily  iso hypotension  -May consider home diuretic on non-dialysis days based on volume status      #HTN, controlled -Continue to monitor  This is a Psychologist, occupational Note.  The care of the patient was discussed with Dr. Thomasene Ripple  and the assessment and plan formulated with their assistance.  Please see their attached note for official documentation of the daily encounter.   LOS: 17 days   Loyal Buba 07/24/2023, 11:45 AM

## 2023-07-24 NOTE — Progress Notes (Signed)
Physical Therapy Treatment Patient Details Name: [name] MRN: [id number] DOB: 04/15/1946 Today's Date: 07/24/2023   History of Present Illness 77 y.o. female presents to [facility] hospital on 06/17/2023 with symptomatic anemia and FTT. Small bowel enteroscopy on 08/06/23. Tracheostomy decannulated 8/1. PMH includes afib, ESRD, COPD, CVA, CHF, laryngeal stenosis s/p tracheostomy 2017, chronic GIB, dementia.    PT Comments  Pt received in supine, obtunded and following <10% of simple motor commands, pt will open eyes briefly to command and attempts to cough when cued and at times will turn her head but pt unable to reach cross-body to opposite rail or participate in rolling to L/R sides and too lethargic to sit upright. Pt assisted to reposition for pressure relief and BLE ace wraps removed as these were placed on Wednesday due to soft BP when pt stands up. RN notified to place them if she is getting OOB to chair or BSC but can remain off when she is resting. Compression socks remain donned in supine but they are too loose for her, RN notified. Pt with apparent functional decline, supervising PT Ryan L notified and disposition changed to SNF given limited progress toward goals, she may need goals downgraded next session when PT re-assesses her if she remains obtunded.    If plan is discharge home, recommend the following: Two people to help with walking and/or transfers;Two people to help with bathing/dressing/bathroom;Assistance with feeding;Direct supervision/assist for medications management;Direct supervision/assist for financial management;Assist for transportation;Help with stairs or ramp for entrance;Supervision due to cognitive status   Can travel by private vehicle     No  Equipment Recommendations  Other (comment);Wheelchair (measurements PT);Wheelchair cushion (measurements PT);Hospital bed (mechanical lift)    Recommendations for Other Services       Precautions / Restrictions  Precautions Precautions: Fall Precaution Comments: Trach decannulated 8/1, watch BP Restrictions Weight Bearing Restrictions: No     Mobility  Bed Mobility Overal bed mobility: Needs Assistance Bed Mobility: Rolling Rolling: Total assist         General bed mobility comments: Pt not able to participate in rolling to L/R sides depsite multimodal cues due to decrased alertness, pt opens eyes briefly then closes them, unable to maintain hand on rail when PTA assisted her to place it, and unable to follow cues for BLE placement with repositioning/rolling.    Transfers Overall transfer level: Needs assistance                 General transfer comment: not safe to attempt, pt too obtunded this date and not following any commands other than opening her eyes    Ambulation/Gait                   Stairs             Wheelchair Mobility     Tilt Bed    Modified Rankin (Stroke Patients Only)       Balance Overall balance assessment: Needs assistance     Sitting balance - Comments: unsafe to attempt this date                                    Cognition Arousal: Obtunded Behavior During Therapy: Flat affect Overall Cognitive Status: Impaired/Different from baseline Area of Impairment: Following commands                       Following Commands: Follows  one step commands inconsistently       General Comments: Pt not able to follow simple commands this date, remains obtunded despite repositioning; focused on pressure relief and VS monitoring, RN notified pt unable to be awakened enough to participate in seated/standing tasks. Per RN, she had poor sleep past few days due to c/o pain.        Exercises Other Exercises Other Exercises: supine BLE PROM: ankle pumps, heel slides x5 reps ea (pt unable to follow instruction to participate)    General Comments General comments (skin integrity, edema, etc.): BP 92/51 (62) HR 69-70  SpO2 90% upon PTA arrival to room on 0.5L O2 [place], after repositioning and cues to attempt to cough, SpO2 reading 93%. Pt turned to R side and pillows placed under her L hip to offload pressure. BLE ace wraps removed as these were placed Wednesday prior to transfer OOB to chair during PT session and only need to be donned when pt preparing to stand or sit up with previous orthostatic hypotension, RN notified. PT still with compression socks on, but too loose, RN notified in case smaller size is available. No visible soiling on bed pad. NT notified to turn her again to other side in 2 hours      Pertinent Vitals/Pain Pain Assessment Pain Assessment: PAINAD Breathing: normal Negative Vocalization: occasional moan/groan, low speech, negative/disapproving quality Facial Expression: facial grimacing Body Language: relaxed Consolability: no need to console PAINAD Score: 3 Pain Location: not localized; some grimacing with repositioning Pain Descriptors / Indicators: Grimacing Pain Intervention(s): Limited activity within patient's tolerance, Monitored during session, Repositioned    Home Living                          Prior Function            PT Goals (current goals can now be found in the care plan section) Acute Rehab PT Goals Patient Stated Goal: to return to ambulation per daughter PT Goal Formulation: With patient Time For Goal Achievement: 07/24/23 Progress towards PT goals: Not progressing toward goals - comment;PT to reassess next treatment    Frequency    Min 1X/week      PT Plan Discharge plan needs to be updated;Frequency needs to be updated;Equipment recommendations need to be updated    Co-evaluation              AM-PAC PT "6 Clicks" Mobility   Outcome Measure  Help needed turning from your back to your side while in a flat bed without using bedrails?: Total Help needed moving from lying on your back to sitting on the side of a flat bed without  using bedrails?: Total Help needed moving to and from a bed to a chair (including a wheelchair)?: Total Help needed standing up from a chair using your arms (e.g., wheelchair or bedside chair)?: Total Help needed to walk in hospital room?: Total Help needed climbing 3-5 steps with a railing? : Total 6 Click Score: 6    End of Session Equipment Utilized During Treatment: Oxygen Activity Tolerance: Patient limited by lethargy Patient left: in bed;with call bell/phone within reach;with bed alarm set Nurse Communication: Mobility status;Need for lift equipment;Other (comment) (lethargy/obtunded; needs smaller size compression socks) PT Visit Diagnosis: Other abnormalities of gait and mobility (R26.89);Muscle weakness (generalized) (M62.81)     Time: 3557-3220 PT Time Calculation (min) (ACUTE ONLY): 14 min  Charges:    $Therapeutic Activity: 8-22 mins PT General Charges $$  ACUTE PT VISIT: 1 Visit                     Govind Furey P., PTA Acute Rehabilitation Services Secure Chat Preferred 9a-5:30pm Office: [phone]    Dorathy Kinsman [facility] 07/24/2023, 3:36 PM

## 2023-07-25 DIAGNOSIS — N186 End stage renal disease: Secondary | ICD-10-CM | POA: Diagnosis not present

## 2023-07-25 DIAGNOSIS — R627 Adult failure to thrive: Secondary | ICD-10-CM | POA: Diagnosis not present

## 2023-07-25 DIAGNOSIS — Z515 Encounter for palliative care: Secondary | ICD-10-CM | POA: Diagnosis not present

## 2023-07-25 DIAGNOSIS — D649 Anemia, unspecified: Secondary | ICD-10-CM | POA: Diagnosis not present

## 2023-07-25 MED ORDER — GLYCOPYRROLATE 0.2 MG/ML IJ SOLN
0.2000 mg | INTRAMUSCULAR | Status: DC
  Administered 2023-07-25 – 2023-07-26 (×8): 0.2 mg via INTRAVENOUS
  Filled 2023-07-25 (×11): qty 1

## 2023-07-25 NOTE — Progress Notes (Addendum)
At [name] baseline, she was able to walk at home with a cane, dress herself, and shower but she has since been unable to. She lives at home with [name] daughter in [place], [place] and follows with [facility].    07/13/2023  she was to see nephrology to determine the start date of [name] dialysis, but reportedly she felt ill and was recommended to come to the ED instead.     Family report that [name] is normally able to complete all of [name] ADLs independently   She has had a tracheostomy for 5 years after it was reported she developed respiratory failure following propofol administration during Watchman  procedure and has since developed laryngeal stenosis.   [name] and family face ongoing treatment option decisions,Advanced directive decisions and anticipatory care needs.  Assessment: Goals of care conversation End-stage renal disease on dialysis Angiodysplastic lesions s/p enteroscopy  Laryngeal stenosis status post tracheostomy End-of-life care Dementia  Recommendations/Plan: Continue DNR/DNI Continue comfort focused care IV Robinul dose decreased from 0.4mg  to 0.2mg  Q4H  Psychosocial and emotional support provided If [name] remains stable for discharge, [facility] typically assists their own patients with hospice-like services at home PMT will continue to follow and support   Prognosis:  Days  Discharge Planning: Anticipated Hospital Death vs home with [facility]/hospice  Care plan was discussed with [name]'s daughter/HCPOA, RN   MDM high          Jeni Salles, PA-C  Palliative Medicine Team Team phone # [phone]  Thank you for allowing the Palliative Medicine Team to assist in the care of this [name]. Please utilize secure chat with additional questions, if there is no response within 30 minutes please call the above phone number.  Palliative Medicine Team providers are available by phone from 7am to 7pm daily and can be reached through the team cell phone.  Should this [name] require assistance outside of these hours, please call the [name]'s attending physician.  At [name] baseline, she was able to walk at home with a cane, dress herself, and shower but she has since been unable to. She lives at home with [name] daughter in [place], [place] and follows with [facility].    07/02/2023  she was to see nephrology to determine the start date of [name] dialysis, but reportedly she felt ill and was recommended to come to the ED instead.     Family report that [name] is normally able to complete all of [name] ADLs independently   She has had a tracheostomy for 5 years after it was reported she developed respiratory failure following propofol administration during Watchman  procedure and has since developed laryngeal stenosis.   [name] and family face ongoing treatment option decisions,Advanced directive decisions and anticipatory care needs.  Assessment: Goals of care conversation End-stage renal disease on dialysis Angiodysplastic lesions s/p enteroscopy  Laryngeal stenosis status post tracheostomy End-of-life care Dementia  Recommendations/Plan: Continue DNR/DNI Continue comfort focused care IV Robinul dose decreased from 0.4mg  to 0.2mg  Q4H  Psychosocial and emotional support provided If [name] remains stable for discharge, [facility] typically assists their own patients with hospice-like services at home PMT will continue to follow and support   Prognosis:  Days  Discharge Planning: Anticipated Hospital Death vs home with [facility]/hospice  Care plan was discussed with [name]'s daughter/HCPOA, RN   MDM high          Jeni Salles, PA-C  Palliative Medicine Team Team phone # [phone]  Thank you for allowing the Palliative Medicine Team to assist in the care of this [name]. Please utilize secure chat with additional questions, if there is no response within 30 minutes please call the above phone number.  Palliative Medicine Team providers are available by phone from 7am to 7pm daily and can be reached through the team cell phone.  Should this [name] require assistance outside of these hours, please call the [name]'s attending physician.

## 2023-07-25 NOTE — Progress Notes (Signed)
Subjective:   Summary: [name] is a 77 y.o. year old female currently admitted on the IMTS HD#18 for acute on chronic anemia.  Overnight Events: NOE   Pt resting comfortably, in no acute distress  Objective:  Vital signs in last 24 hours: Vitals:   07/24/23 0504 07/24/23 0818 07/24/23 1515 07/25/23 0825  BP: (!) 163/119 (!) 101/47 (!) 92/51 (!) 113/90  Pulse: 80 69 69 70  Resp: 18 20    Temp: 98.8 F (37.1 C) (!) 97.5 F (36.4 C)  100.3 F (37.9 C)  TempSrc: Oral Oral    SpO2: 94% 98% 93% 100%  Weight:      Height:       Supplemental O2: Nasal Cannula SpO2: 100 % O2 Flow Rate (L/min): 0.5 L/min FiO2 (%): 28 %   Physical Exam:  Constitutional: Elderly female, in no acute distress Cardiovascular: RRR, no murmurs, rubs or gallops Pulmonary/Chest: normal work of breathing on room air, lungs clear to auscultation bilaterally Abdominal: soft, non-tender, non-distended Skin: warm and dry Extremities: upper/lower extremity pulses 2+, no lower extremity edema present  Filed Weights   07/23/23 0600 07/23/23 1436 07/23/23 1900  Weight: 48.7 kg 47.6 kg 48 kg     Intake/Output Summary (Last 24 hours) at 07/25/2023 0903 Last data filed at 07/25/2023 0640 Gross per 24 hour  Intake --  Output 0 ml  Net 0 ml   Net IO Since Admission: 3,648.99 mL [07/25/23 0903]  Pertinent Labs:    Latest Ref Rng & Units 07/24/2023    7:25 AM 07/23/2023   12:00 PM 07/22/2023    8:46 AM  CBC  WBC 4.0 - 10.5 K/uL 13.2  11.3  9.2   Hemoglobin 12.0 - 15.0 g/dL 7.7  8.2  8.3   Hematocrit 36.0 - 46.0 % 25.5  26.2  26.4   Platelets 150 - 400 K/uL 235  236  227        Latest Ref Rng & Units 07/24/2023    7:25 AM 07/23/2023   12:00 PM 07/22/2023    8:46 AM  CMP  Glucose 70 - 99 mg/dL 96  97  644   BUN 8 - 23 mg/dL 29  55  36   Creatinine 0.44 - 1.00 mg/dL 0.34  7.42  5.95   Sodium 135 - 145 mmol/L 136  136  135   Potassium 3.5 - 5.1 mmol/L 4.6  5.3  4.5   Chloride 98  - 111 mmol/L 93  98  92   CO2 22 - 32 mmol/L 27  24  25    Calcium 8.9 - 10.3 mg/dL 8.7  8.7  8.7      Assessment/Plan:   Principal Problem:   Acute on chronic anemia Active Problems:   Current use of long term anticoagulation   Respiratory distress   Tracheostomy present (HCC)   Melena   ESRD on dialysis (HCC)   Acute encephalopathy   Gastrointestinal hemorrhage   AVM (arteriovenous malformation) of duodenum, acquired with hemorrhage   AVM (arteriovenous malformation) of stomach, acquired with hemorrhage   Protein-calorie malnutrition, severe   Discharge planning issues   Failure to thrive in adult   Pressure injury of coccygeal region, stage 2 [facility])   Patient Summary: [name] is a 77 y.o. with a pertinent PMH of complete heart block s/p Abbott PPM in 1986, atrial fibrillation, HTN, aortic atherosclerosis, laryngeal stenosis s/p  tracheostomy, CVA 04/2019, GI bleed 2012, AVMs, HCM/chronic congestive heart failure, ESRD s/p fistula, COPD , who presented with failure to thrive and admitted for acute on chronic anemia in the setting of GI bleed.    #Comfort Care  Pt with multiple co morbidities, including angiodysplastic lesions causing recurrent GI bleeds, s/p trach removal for laryngeal stenosis, new ESRD on Hemodialysis was transitioned to comfort care on 8/9. Per nephrology and chart review, she was not tolerating dialysis well, with MAPs dropping to the low 50 range and had to have multiple doses of midodrine to sustain MAP >65. Per discussions with daughter, she has now been transitioned to comfort care. She is DNR/DNI. Family may consider home with hospice care, but will continue comfort while pt is hospitalized.   Plan:  - Comfort care measures per palliative, appreciate recommendations     Diet: Heart Healthy IVF: None,None VTE: None Code: DNR/DNI   Dispo: Comfort Care   Olegario Messier, MD PGY-2 Internal Medicine Resident Pager Number [phone] Please  contact the on call pager after 5 pm and on weekends at [phone].

## 2023-07-26 DIAGNOSIS — D649 Anemia, unspecified: Secondary | ICD-10-CM | POA: Diagnosis not present

## 2023-07-26 DIAGNOSIS — N186 End stage renal disease: Secondary | ICD-10-CM | POA: Diagnosis not present

## 2023-07-26 DIAGNOSIS — Z992 Dependence on renal dialysis: Secondary | ICD-10-CM | POA: Diagnosis not present

## 2023-07-26 DIAGNOSIS — R627 Adult failure to thrive: Secondary | ICD-10-CM | POA: Diagnosis not present

## 2023-07-26 DIAGNOSIS — Z515 Encounter for palliative care: Secondary | ICD-10-CM | POA: Diagnosis not present

## 2023-07-26 MED ORDER — HYDROMORPHONE HCL 1 MG/ML IJ SOLN
0.5000 mg | INTRAMUSCULAR | Status: DC
  Administered 2023-07-26 (×2): 0.5 mg via INTRAVENOUS
  Filled 2023-07-26 (×2): qty 1

## 2023-07-30 ENCOUNTER — Other Ambulatory Visit (HOSPITAL_BASED_OUTPATIENT_CLINIC_OR_DEPARTMENT_OTHER): Payer: Medicare (Managed Care)

## 2023-08-16 NOTE — Plan of Care (Signed)
Patient's care was changed to comfort, diseased.

## 2023-08-16 NOTE — Plan of Care (Signed)
According to previous shift report, patient time of death was 59. Patient on bed  eyes closed, no signs of respiratory effort.  No apical pulse on auscultation nor palpable carotid pulse. No heart or respiratory sounds noted,. No corneal reflex. No response to supraorbital pressure. Multiple family at bedside. One of the daughter Miss Shrena Belmarez confirmed [employer]. See post mortem documentation. Patient placement coordinator notified. No concern from patient's family at this time.

## 2023-08-16 NOTE — Plan of Care (Signed)
Patient escorted down to the morgue via hospital trolley.

## 2023-08-16 NOTE — Death Summary Note (Signed)
DEATH SUMMARY   Patient Details  Name: [name] MRN: [id number] DOB: 1946/09/12 PCP:[facility]  Admission/Discharge Information   Admit Date:  August 03, 2023  Date of Death: Date of Death: 08-22-2023  Time of Death: Time of Death: 1849  Length of Stay: 30-Aug-2023   Principle Cause of death: ESRD, failure to thrive  Hospital Diagnoses: Principal Problem:   Acute on chronic anemia Active Problems:   Current use of long term anticoagulation   Respiratory distress   Tracheostomy present (HCC)   Melena   ESRD on dialysis (HCC)   Acute encephalopathy   Gastrointestinal hemorrhage   AVM (arteriovenous malformation) of duodenum, acquired with hemorrhage   AVM (arteriovenous malformation) of stomach, acquired with hemorrhage   Protein-calorie malnutrition, severe   Discharge planning issues   Failure to thrive in adult   Pressure injury of coccygeal region, stage 2 [facility])   Hospital Course: [name] is a 77 year-old female who has a PMHx of of complete heart block s/p Abbott PPM in 1986, atrial fibrillation, HTN, aortic atherosclerosis, laryngeal stenosis s/p tracheostomy, CVA 04/2019, GI bleed 2012, AVMs, HCM/chronic congestive heart failure, ESRD s/p fistula, COPD who presented with decreased PO intake and melena found to have acute on chronic anemia and GI bleed. During the day of admission, she was suppose to go to her first session of dialysis but was unable to because she began to worsen. Per daughter who she lives, patient has deviated from her baseline functional status for the last couple weeks and had been unable to care for herself at home with dressing herself, showering, and making food.   #Melena  H/o small bowel AVMs #Angiodysplastic lesions s/p enteroscopy  #Acute on chronic anemia, improving  On admission, her initial Hb dropped to 4.7, K >7, and BUN 120. She was given 3u pRBC with Hb return to baseline at ~10. Hx of chronic  anemia in the setting of ESRD, who presented with melena c/f acute blood loss. Also has hx of GI bleed in 2012 and reported small bowel AVMs in 2022 treated with APC. Colonoscopy in 2021 showed multiple polyps in colon that were resected. No cause for anemia was noted in the colon but was thought to be related to small bowel AVM. Enteroscopy on 7/25 showed normal appearing esophagus, 3-4 angiodysplastic lesions located at the stomach, and 2 lesions at duodenum which were all treated with APC. She was started on PPI and later restated on home Eliquis following improvement in her hemoglobin. She later developed multiple episodes of melenic stool with a reduced drop in hemoglobin below 7.0 requiring 2 additional units of pRBCs. Her drop in hemoglobin was suspected to be secondary to ulcers from recent APC. She was started on ESA agent and received two doses. Plan to continue oral PPI BID and carafate.       #ESRD s/p AV graft fistula #Uremia  Left UE AV graft placement on 06/01/23. Unclear her baseline Cr but likely ~2-3 . Etiology for her renal failure unknown but suspected to be secondary to HTN. Completed 3 of 3 initiation dialysis sessions. PTH levels elevated. Hemodialysis schedule T/Th/Sat. HbsAb noted to show incomplete immunity. HBsAg non-reactive. She received 1st of 2-dose series during hospitalization. She tolerated 4 hours of in chair for consideration of outpatient dialysis. During dialysis sessions, pt was noted to have MAPs of 50-55, and she was started on midodrine. She required multiple doses of midodrine just to ensure MAP goal of >65,  which did not make her a good candidate for outpatient dialysis centers. After discussion with family, palliative care was introduced. She was transitioned to full comfort care on 07/31/2023, and peacefully passed away on 08-02-23 at  1849.      #Dementia  Reported onset of dementia in 2021. Previously able to perform ADLs however over the last few weeks patient has  deviated from her baseline and requires the support of her family members to care for her. She is now oriented to person and place. BUN significantly elevated on initial exam likely worsening her mental status. She was maintained on her home donepezil.   #Trach collar, decannulated  H/o laryngeal stenosis  Patient previously required 5 L supplemental O2 through trach collar with copious sputum but was later capped and did not require supplemental O2. ENT was consulted and decannulation was done on 8/1. She has had stable oxygen saturations    Chronic Problems   #Atrial fibrillation #HLD #H/o stroke Reported stroke on 04/2019 with residual expressive aphasia. Afib has since been rate controlled with Metoprolol succinate 12.5 daily. Last lipid panel showed LDL 55 (05/29/23). Was continued on home Crestor and metoprolol succinate. Eliquis later restarted following improvement in hemoglobin.    #Chronic CHF  HCM  #Complete heart block s/p PPM (1986)  Follows with HF clinic. Home GDMT incldues Metoprolol succinate 12.5mg  daily, Torsemide 40mg  every day. Last ECHO 09/2021 LVEF 40-45%   #HTN: Well-controlled at home. Continued on antihypertensives as stated above         Procedures: NA  Consultations: Nephrology, Gastroenterology, Palliative Care  The results of significant diagnostics from this hospitalization (including imaging, microbiology, ancillary and laboratory) are listed below for reference.   Significant Diagnostic Studies: DG Chest Port 1 View  Result Date: 07/22/2023 CLINICAL DATA:  62952 Acute respiratory distress 66502 EXAM: PORTABLE CHEST 1 VIEW COMPARISON:  07/07/2023 FINDINGS: Previously seen tracheostomy tube has been removed. Right-sided implanted cardiac device remains in place. Stable cardiomegaly. Aortic atherosclerosis. Slightly increased interstitial markings bilaterally. No lobar consolidation. No pleural effusion or pneumothorax. IMPRESSION: Slightly increased  interstitial markings bilaterally, which may reflect mild pulmonary edema. Electronically Signed   By: Duanne Guess D.O.   On: 07/22/2023 11:55   DG Chest Port 1 View  Result Date: 07/07/2023 CLINICAL DATA:  Failure to thrive EXAM: PORTABLE CHEST 1 VIEW COMPARISON:  Previous studies including the examination of 04/23/2020 FINDINGS: Transverse diameter of heart is increased. There are no signs of alveolar pulmonary edema or focal pulmonary consolidation. Tip of tracheostomy is 7.7 cm above the carina. Pacemaker battery is seen in right infraclavicular region. There is no pleural effusion or pneumothorax. IMPRESSION: Cardiomegaly. There are no signs of pulmonary edema or focal pulmonary consolidation. Electronically Signed   By: Ernie Avena M.D.   On: 07/07/2023 15:27    Microbiology: No results found for this or any previous visit (from the past 240 hour(s)).  Time spent: 30 minutes  Signed: Olegario Messier, MD August 02, 2023

## 2023-08-16 NOTE — Progress Notes (Signed)
Daily Progress Note   Patient Name: [name]       Date: 08/01/2023 DOB: April 02, 1946  Age: 77 y.o. MRN#: [id number] Attending Physician: Inez Catalina, MD Primary Care Physician: [facility] Admit Date: 24-Jul-2023  Reason for Consultation/Follow-up: Establishing goals of care  Subjective: Medical records reviewed including progress notes, MAR. Patient has required 4 doses of PRN dilaudid and 1 dose of PRN ativan in the past 24 hours. Patient assessed at the bedside. She is mildly tachypneic on 1L [place]. No family present during my visit.  Length of Stay: 19   Physical Exam Vitals and nursing note reviewed.  Constitutional:      Appearance: She is ill-appearing.     Interventions: Nasal cannula in place.  Cardiovascular:     Rate and Rhythm: Normal rate.  Pulmonary:     Effort: Pulmonary effort is normal. Tachypnea present.  Skin:    General: Skin is warm and dry.  Neurological:     Mental Status: She is lethargic.     Comments: Minimally responsive          Vital Signs: BP (!) 88/43 (BP Location: Right Arm)   Pulse 70   Temp 98.3 F (36.8 C)   Resp (!) 26   Ht 5\' 5"  (1.651 m)   Wt 48 kg   SpO2 100%   BMI 17.61 kg/m  SpO2: SpO2: 100 % O2 Device: O2 Device: Nasal Cannula O2 Flow Rate: O2 Flow Rate (L/min): 0.5 L/min      Palliative Assessment/Data: 10%    Palliative Care Assessment & Plan   Patient Profile: 77 y.o. female  admitted on 07/24/23 with PMHx of complete heart block s/p Abbott PPM in 1986, atrial fibrillation, HTN, aortic atherosclerosis, laryngeal stenosis, CVA 04/2019, AVMs/GI bleed in 2012, HCM/chronic congestive heart failure, ESRD s/p fistula (not on HD yet), and COPD who presents with decreased PO iOver the last few  days she has been reported to have melena however she has had a history of GI bleed and has developed melena in the past. According to the family, there was no identifiable source of her prior GI bleed. ntake x 2-3 weeks and melena reported over the last 3 days.    Over the last  week she has reportedly deviated from her baseline functional status. At her baseline, she was able to walk at home with a cane, dress herself, and shower but she has since been unable to. She lives at home with her daughter in [place], [place] and follows with [facility].    07/24/2023  she was to see nephrology to determine the start date of her dialysis, but reportedly she felt ill and was recommended to come to the ED instead.     Family report that patient is normally able to complete all of her ADLs independently   She has had a tracheostomy for 5 years after it was reported she developed respiratory failure following propofol administration during Watchman procedure and has since developed laryngeal stenosis.   Patient and family face ongoing treatment option decisions,Advanced directive decisions and anticipatory care needs.  Assessment: Goals of care conversation End-stage renal disease on dialysis Angiodysplastic lesions s/p  enteroscopy  Laryngeal stenosis status post tracheostomy End-of-life care Dementia  Recommendations/Plan: Continue DNR/DNI Continue comfort focused care Scheduled Dilaudid 0.5mg  IV Q4H  Psychosocial and emotional support provided If patient remains stable for discharge, [facility] typically assists their own patients with hospice-like services at home PMT will continue to follow and support   Prognosis:  Days  Discharge Planning: Anticipated Hospital Death vs home with [facility]/hospice  Care plan was discussed with    MDM high         Nolen Lindamood Jeni Salles, PA-C  Palliative Medicine Team Team phone # [phone]  Thank you for allowing the Palliative Medicine Team to assist in the care  of this patient. Please utilize secure chat with additional questions, if there is no response within 30 minutes please call the above phone number.  Palliative Medicine Team providers are available by phone from 7am to 7pm daily and can be reached through the team cell phone.  Should this patient require assistance outside of these hours, please call the patient's attending physician.

## 2023-08-16 NOTE — Progress Notes (Addendum)
Subjective: [name] is a 77 year-old female who has a PMHx of of complete heart block s/p Abbott PPM in 1986, atrial fibrillation, HTN, aortic atherosclerosis, laryngeal stenosis s/p tracheostomy, CVA 04/2019, GI bleed 2012, AVMs, HCM/chronic congestive heart failure, ESRD s/p fistula, COPD who presented with decreased PO intake and melena found to have acute on chronic anemia and GI bleed   Sleeping, resting comfortably  Objective: Vital signs in last 24 hours: Vitals:   07/24/23 0818 07/24/23 1515 07/25/23 0825 07/25/23 1825  BP: (!) 101/47 (!) 92/51 (!) 113/90   Pulse: 69 69 70   Resp: 20   (!) 26  Temp: (!) 97.5 F (36.4 C)  100.3 F (37.9 C)   TempSrc: Oral     SpO2: 98% 93% 100%   Weight:      Height:       Weight change:   Intake/Output Summary (Last 24 hours) at 07/18/2023 0813 Last data filed at 07/18/2023 0445 Gross per 24 hour  Intake --  Output 0 ml  Net 0 ml   Physical Exam: General appearance: unable to wake, frail, NAD  Eyes: conjunctivae/corneas clear.  Neck:  tracheostomy valve removed, dressing in place  Lungs: CTAB, normal WOB, on nasal cannula  Heart: RRR, S1/S2 normal, systolic murmur at RUSB  Abdomen: soft, NTND Extremities: no LE edema, LUE AV graft fistula in place  Lab Results:    Latest Ref Rng & Units 07/24/2023    7:25 AM 07/23/2023   12:00 PM 07/22/2023    8:46 AM  CBC  WBC 4.0 - 10.5 K/uL 13.2  11.3  9.2   Hemoglobin 12.0 - 15.0 g/dL 7.7  8.2  8.3   Hematocrit 36.0 - 46.0 % 25.5  26.2  26.4   Platelets 150 - 400 K/uL 235  236  227       Latest Ref Rng & Units 07/24/2023    7:25 AM 07/23/2023   12:00 PM 07/22/2023    8:46 AM  CMP  Glucose 70 - 99 mg/dL 96  97  161   BUN 8 - 23 mg/dL 29  55  36   Creatinine 0.44 - 1.00 mg/dL 0.96  0.45  4.09   Sodium 135 - 145 mmol/L 136  136  135   Potassium 3.5 - 5.1 mmol/L 4.6  5.3  4.5   Chloride 98 - 111 mmol/L 93  98  92   CO2 22 - 32 mmol/L 27  24  25    Calcium 8.9 - 10.3 mg/dL 8.7  8.7   8.7    No results for input(s): "GLUCAP" in the last 72 hours.  Micro Results: No results found for this or any previous visit (from the past 240 hour(s)).  Studies/Results: No results found.  Medications: I have reviewed the patient's current medications. Scheduled Meds:  Chlorhexidine Gluconate Cloth  6 each Topical Q0600   glycopyrrolate  0.2 mg Intravenous Q4H   lidocaine  1 patch Transdermal Q24H   mouth rinse  15 mL Mouth Rinse 4 times per day   scopolamine  1 patch Transdermal Q72H   Continuous Infusions:   PRN Meds:.acetaminophen **OR** acetaminophen, alum & mag hydroxide-simeth, antiseptic oral rinse, bisacodyl, feeding supplement, guaiFENesin, HYDROmorphone (DILAUDID) injection, LORazepam, ondansetron (ZOFRAN) IV, mouth rinse, polyvinyl alcohol  Assessment/Plan: Principal Problem:   Acute on chronic anemia Active Problems:   Current use of long term anticoagulation   Respiratory distress   Tracheostomy present (HCC)   Melena   ESRD on  dialysis [facility])   Acute encephalopathy   Gastrointestinal hemorrhage   AVM (arteriovenous malformation) of duodenum, acquired with hemorrhage   AVM (arteriovenous malformation) of stomach, acquired with hemorrhage   Protein-calorie malnutrition, severe   Discharge planning issues   Failure to thrive in adult   Pressure injury of coccygeal region, stage 2 (HCC)  [name] is a 77 year-old female who has a PMHx of of complete heart block s/p Abbott PPM in 1986, atrial fibrillation, HTN, aortic atherosclerosis, laryngeal stenosis s/p tracheostomy, CVA 04/2019, GI bleed 2012, AVMs, HCM/chronic congestive heart failure, ESRD s/p fistula, COPD who presented with decreased PO intake and melena found to have acute on chronic anemia and GI bleed   Active Problems   #Comfort Care  Transitioned to comfort care on 8/9 following family discussion with palliative care. She was hypotensive during dialysis sessions, had poor nutrition,  and chronic UGIB secondary to angiodysplastic lesions frequently requiring transfusions. Family may consider home with hospice care, but will continue comfort while pt is hospitalized. No further labs will be obtained    - IV robinul 0.2 q4h  - Lidocaine patch  - Tylenol PRN q6h - IV Dilaudid PRN  - IV Zofran PRN q6h  - Palliative care following, appreciate recs  #Poor PO intake -Feeding supplement   #Respiratory distress, resolved    #Pulmonary edema    -Robitussin PRN  #Stage 2 Coccyx pressure injury #Left Posterior Leg Deep tissue pressure injury  Stage 2 coccyx pressure injury that extends to bilateral buttocks (R greater than L) and L posterior leg pressure injury    -Dressing placed   #Trach collar (decannulated)  H/o laryngeal stenosis, stable  Decannulated tracheostomy tube on 8/1    -Wean O2 as tolerated, on 0.5L nasal cannula   This is a Psychologist, occupational Note.  The care of the patient was discussed with Dr. Criselda Peaches and the assessment and plan formulated with their assistance.  Please see their attached note for official documentation of the daily encounter.   LOS: 19 days   Loyal Buba 08/10/2023, 8:13 AM

## 2023-08-16 NOTE — Plan of Care (Signed)
Awaiting for few more family members to arrive.

## 2023-08-16 DEATH — deceased

## 2023-09-01 ENCOUNTER — Ambulatory Visit (HOSPITAL_BASED_OUTPATIENT_CLINIC_OR_DEPARTMENT_OTHER): Payer: Medicare (Managed Care) | Admitting: Family

## 2023-11-25 ENCOUNTER — Encounter: Payer: Medicare (Managed Care) | Admitting: Cardiology

## 2024-07-07 NOTE — Progress Notes (Signed)
 This encounter was created in error - please disregard.
